# Patient Record
Sex: Female | Born: 1955 | Race: Black or African American | Hispanic: No | Marital: Married | Smoking: Current every day smoker
Health system: Southern US, Community
[De-identification: ages and names within clinical notes are randomized; demographics above are authoritative.]

## PROBLEM LIST (undated history)

## (undated) DIAGNOSIS — M797 Fibromyalgia: Secondary | ICD-10-CM

## (undated) DIAGNOSIS — R1115 Cyclical vomiting syndrome unrelated to migraine: Secondary | ICD-10-CM

## (undated) DIAGNOSIS — J45909 Unspecified asthma, uncomplicated: Secondary | ICD-10-CM

## (undated) DIAGNOSIS — M5432 Sciatica, left side: Secondary | ICD-10-CM

## (undated) DIAGNOSIS — K5909 Other constipation: Secondary | ICD-10-CM

## (undated) DIAGNOSIS — K7460 Unspecified cirrhosis of liver: Secondary | ICD-10-CM

## (undated) DIAGNOSIS — G8929 Other chronic pain: Secondary | ICD-10-CM

## (undated) DIAGNOSIS — F1290 Cannabis use, unspecified, uncomplicated: Secondary | ICD-10-CM

## (undated) DIAGNOSIS — Z9889 Other specified postprocedural states: Secondary | ICD-10-CM

## (undated) DIAGNOSIS — R112 Nausea with vomiting, unspecified: Secondary | ICD-10-CM

## (undated) DIAGNOSIS — I10 Essential (primary) hypertension: Secondary | ICD-10-CM

## (undated) DIAGNOSIS — A048 Other specified bacterial intestinal infections: Secondary | ICD-10-CM

## (undated) DIAGNOSIS — B1920 Unspecified viral hepatitis C without hepatic coma: Secondary | ICD-10-CM

## (undated) DIAGNOSIS — M549 Dorsalgia, unspecified: Secondary | ICD-10-CM

## (undated) DIAGNOSIS — D219 Benign neoplasm of connective and other soft tissue, unspecified: Secondary | ICD-10-CM

## (undated) DIAGNOSIS — K219 Gastro-esophageal reflux disease without esophagitis: Secondary | ICD-10-CM

## (undated) DIAGNOSIS — D649 Anemia, unspecified: Secondary | ICD-10-CM

## (undated) DIAGNOSIS — R109 Unspecified abdominal pain: Secondary | ICD-10-CM

## (undated) HISTORY — DX: Unspecified viral hepatitis C without hepatic coma: B19.20

## (undated) HISTORY — DX: Other specified bacterial intestinal infections: A04.8

## (undated) HISTORY — DX: Unspecified cirrhosis of liver: K74.60

## (undated) HISTORY — PX: KNEE SURGERY: SHX244

---

## 2000-12-11 ENCOUNTER — Emergency Department (HOSPITAL_COMMUNITY): Admission: EM | Admit: 2000-12-11 | Discharge: 2000-12-11 | Payer: Self-pay | Admitting: Emergency Medicine

## 2001-08-16 ENCOUNTER — Encounter: Payer: Self-pay | Admitting: Family Medicine

## 2001-08-16 ENCOUNTER — Ambulatory Visit (HOSPITAL_COMMUNITY): Admission: RE | Admit: 2001-08-16 | Discharge: 2001-08-16 | Payer: Self-pay | Admitting: Family Medicine

## 2003-01-15 ENCOUNTER — Ambulatory Visit (HOSPITAL_COMMUNITY): Admission: RE | Admit: 2003-01-15 | Discharge: 2003-01-15 | Payer: Self-pay | Admitting: Family Medicine

## 2003-08-31 ENCOUNTER — Emergency Department (HOSPITAL_COMMUNITY): Admission: EM | Admit: 2003-08-31 | Discharge: 2003-08-31 | Payer: Self-pay | Admitting: Emergency Medicine

## 2004-03-13 ENCOUNTER — Emergency Department (HOSPITAL_COMMUNITY): Admission: EM | Admit: 2004-03-13 | Discharge: 2004-03-14 | Payer: Self-pay | Admitting: Emergency Medicine

## 2004-08-12 ENCOUNTER — Ambulatory Visit (HOSPITAL_COMMUNITY): Admission: RE | Admit: 2004-08-12 | Discharge: 2004-08-12 | Payer: Self-pay | Admitting: Family Medicine

## 2005-01-10 ENCOUNTER — Emergency Department (HOSPITAL_COMMUNITY): Admission: EM | Admit: 2005-01-10 | Discharge: 2005-01-10 | Payer: Self-pay | Admitting: Emergency Medicine

## 2005-07-14 ENCOUNTER — Emergency Department (HOSPITAL_COMMUNITY): Admission: EM | Admit: 2005-07-14 | Discharge: 2005-07-14 | Payer: Self-pay | Admitting: Emergency Medicine

## 2007-08-18 ENCOUNTER — Ambulatory Visit (HOSPITAL_COMMUNITY): Admission: RE | Admit: 2007-08-18 | Discharge: 2007-08-18 | Payer: Self-pay | Admitting: Family Medicine

## 2007-09-07 ENCOUNTER — Encounter (INDEPENDENT_AMBULATORY_CARE_PROVIDER_SITE_OTHER): Payer: Self-pay | Admitting: *Deleted

## 2008-01-11 ENCOUNTER — Ambulatory Visit: Payer: Self-pay | Admitting: Internal Medicine

## 2008-01-18 ENCOUNTER — Ambulatory Visit (HOSPITAL_COMMUNITY): Admission: RE | Admit: 2008-01-18 | Discharge: 2008-01-18 | Payer: Self-pay | Admitting: Internal Medicine

## 2008-01-18 ENCOUNTER — Encounter: Payer: Self-pay | Admitting: Internal Medicine

## 2008-01-18 ENCOUNTER — Ambulatory Visit: Payer: Self-pay | Admitting: Internal Medicine

## 2008-01-18 HISTORY — PX: COLONOSCOPY: SHX174

## 2008-01-18 HISTORY — PX: ESOPHAGOGASTRODUODENOSCOPY: SHX1529

## 2008-01-23 ENCOUNTER — Ambulatory Visit (HOSPITAL_COMMUNITY): Admission: RE | Admit: 2008-01-23 | Discharge: 2008-01-23 | Payer: Self-pay | Admitting: Internal Medicine

## 2008-03-29 ENCOUNTER — Ambulatory Visit: Payer: Self-pay | Admitting: Internal Medicine

## 2008-04-05 ENCOUNTER — Encounter (HOSPITAL_COMMUNITY): Admission: RE | Admit: 2008-04-05 | Discharge: 2008-04-29 | Payer: Self-pay | Admitting: Internal Medicine

## 2008-04-30 ENCOUNTER — Ambulatory Visit: Payer: Self-pay | Admitting: Gastroenterology

## 2008-05-01 ENCOUNTER — Ambulatory Visit (HOSPITAL_COMMUNITY): Admission: RE | Admit: 2008-05-01 | Discharge: 2008-05-01 | Payer: Self-pay | Admitting: Internal Medicine

## 2008-09-16 ENCOUNTER — Ambulatory Visit (HOSPITAL_COMMUNITY): Admission: RE | Admit: 2008-09-16 | Discharge: 2008-09-16 | Payer: Self-pay | Admitting: Family Medicine

## 2008-10-30 ENCOUNTER — Encounter: Payer: Self-pay | Admitting: Internal Medicine

## 2008-12-27 ENCOUNTER — Encounter: Payer: Self-pay | Admitting: Internal Medicine

## 2009-06-24 ENCOUNTER — Encounter: Payer: Self-pay | Admitting: Orthopedic Surgery

## 2009-08-04 ENCOUNTER — Emergency Department (HOSPITAL_COMMUNITY): Admission: EM | Admit: 2009-08-04 | Discharge: 2009-08-04 | Payer: Self-pay | Admitting: Emergency Medicine

## 2009-10-23 ENCOUNTER — Ambulatory Visit (HOSPITAL_COMMUNITY): Admission: RE | Admit: 2009-10-23 | Discharge: 2009-10-23 | Payer: Self-pay | Admitting: Family Medicine

## 2010-03-13 ENCOUNTER — Emergency Department (HOSPITAL_COMMUNITY): Admission: EM | Admit: 2010-03-13 | Discharge: 2010-03-13 | Payer: Self-pay | Admitting: Emergency Medicine

## 2010-08-11 ENCOUNTER — Encounter: Payer: Self-pay | Admitting: Orthopedic Surgery

## 2010-08-11 ENCOUNTER — Emergency Department (HOSPITAL_COMMUNITY)
Admission: EM | Admit: 2010-08-11 | Discharge: 2010-08-12 | Payer: Self-pay | Source: Home / Self Care | Admitting: Emergency Medicine

## 2010-08-12 ENCOUNTER — Ambulatory Visit: Admit: 2010-08-12 | Payer: Self-pay | Admitting: Orthopedic Surgery

## 2010-08-12 ENCOUNTER — Ambulatory Visit
Admission: RE | Admit: 2010-08-12 | Discharge: 2010-08-12 | Payer: Self-pay | Source: Home / Self Care | Attending: Orthopedic Surgery | Admitting: Orthopedic Surgery

## 2010-08-12 ENCOUNTER — Encounter: Payer: Self-pay | Admitting: Orthopedic Surgery

## 2010-08-12 DIAGNOSIS — S82109A Unspecified fracture of upper end of unspecified tibia, initial encounter for closed fracture: Secondary | ICD-10-CM | POA: Insufficient documentation

## 2010-08-16 ENCOUNTER — Emergency Department (HOSPITAL_COMMUNITY)
Admission: EM | Admit: 2010-08-16 | Discharge: 2010-08-17 | Payer: Self-pay | Source: Home / Self Care | Admitting: Emergency Medicine

## 2010-08-18 ENCOUNTER — Ambulatory Visit: Admit: 2010-08-18 | Payer: Self-pay | Admitting: Orthopedic Surgery

## 2010-08-18 ENCOUNTER — Telehealth: Payer: Self-pay | Admitting: Orthopedic Surgery

## 2010-08-20 ENCOUNTER — Ambulatory Visit
Admission: RE | Admit: 2010-08-20 | Discharge: 2010-08-20 | Payer: Self-pay | Source: Home / Self Care | Attending: Orthopedic Surgery | Admitting: Orthopedic Surgery

## 2010-08-24 ENCOUNTER — Encounter: Payer: Self-pay | Admitting: Internal Medicine

## 2010-09-03 NOTE — Assessment & Plan Note (Signed)
Summary: 1 WK FOL-UP FX LT TIBIA/SELF-PAY/WKJ   Visit Type:  Follow-up Referring Provider:  ap er Primary Provider:  na  CC:  left knee fracture.  History of Present Illness: I saw Colleen Lee in the office today for a 1 week followup visit.  She is a 55 years old woman with the complaint of: LEFT knee pain secondary to fracture  Diagnosis:Problem # 1:  CLOSED FRACTURE OF UPPER END OF TIBIA (ICD-823.00)  DOI 08/11/10 stepped into a hole and fell.  Xrays [facility] left knee 08/11/10.  Meds: Tylox 5-500 Mg Caps (Oxycodone-acetaminophen) .... One tablet every 4 hours p.r.n. pain  Ibuprofen 800 Mg Tabs (Ibuprofen) .... One every 8 hours p.r.n. pain)  Promethazine Hcl 25 Mg Tabs (Promethazine hcl) .... One q.4 p.r.n. nausea  Xrays today  Complaints:nausea    Allergies: 1)  ! Penicillin  Physical Exam  Additional Exam:   LEFT knee.  Skin ecchymosis from cruising. It runs from her mid thigh to her lower tibial area.  Knee effusion has decreased significantly. Compartments are soft. Distal neurologic function is normal. Pulses are good. Color, is good. Temperature is good   Impression & Recommendations:  Problem # 1:  CLOSED FRACTURE OF UPPER END OF TIBIA (ICD-823.00) Assessment Comment Only  Separate and Identifiable X-Ray report   3 views, LEFT knee, including 10 call till view for fractured tibial plateau.  Medial tibial plateau fracture, nondisplaced. No avulsions, otherwise noted.  Impression medial tibial plateau fracture. No change from previous position.       plan range of motion brace as stated below. Advance 20 to 30 next visit if x-rays are okay  10-60 ROM BRACE   Orders: Post-Op Check ([address]) Knee x-ray,  3 views ([address]454)  Medications Added to Medication List This Visit: 1)  Norco 10-325 Mg Tabs (Hydrocodone-acetaminophen) .Marland Kitchen.. 1 q 4 as needed pain  Patient Instructions: 1)  remove for bathing and slleping  2)  return in 2 weeks xrays and advance rom    3)  continue crutches  Prescriptions: NORCO 10-325 MG TABS (HYDROCODONE-ACETAMINOPHEN) 1 q 4 as needed pain  #42 x 5   Entered and Authorized by:   Fuller Canada MD   Signed by:   Fuller Canada MD on 08/20/2010   Method used:   Print then Give to Patient   RxID:   [id number]    Orders Added: 1)  Post-Op Check [30865] 2)  Knee x-ray,  3 views [78469]

## 2010-09-03 NOTE — Letter (Signed)
Summary: LETTER FROM [facility]  LETTER FROM [facility]   Imported By: Diana Eves 12/27/2008 16:35:55  _____________________________________________________________________  External Attachment:    Type:   Image     Comment:   External Document

## 2010-09-03 NOTE — Letter (Signed)
Summary: [name] Referral No Show  [name]  [address]  [place], [place] [address]   Phone: [phone]  Fax: [phone]    Miguel [facility]. [facility] Salome ArntElonda Husky, FNP [address] [place], [place]  [address]   June 24, 2009   Re:    Colleen Lee [facility] DOB:    30-Mar-1956    The above named patient was a no show for the scheduled appointment:  Date:   Monday,  06/23/2009 Time:  2:00 PM  Thank you for your kind referral.  Please feel free to contact our office if we can be of further service.   Sincerely,   [facility] Office of Dr. Terrance Mass, MD

## 2010-09-03 NOTE — Medication Information (Signed)
Summary: Tax adviser   Imported By: Cammie Sickle 08/12/2010 17:52:39  _____________________________________________________________________  External Attachment:    Type:   Image     Comment:   External Document

## 2010-09-03 NOTE — Progress Notes (Signed)
Summary: call from patient, keeping regular appt  Phone Note Call from Patient   Caller: Patient Summary of Call: Patient had been given an appt for today, 08/18/10, fol'g re-visit to [facility] Sunday night, for foot and lower leg swelling,bruising; patient did call back and cancel today's appt as said she is much better, and will keep her regular appt for Thurs 08/20/10. Initial call taken by: Cammie Sickle,  August 18, 2010 3:08 PM

## 2010-09-03 NOTE — Letter (Signed)
Summary: history form   history form   Imported By: Eugenio Hoes 08/12/2010 16:54:32  _____________________________________________________________________  External Attachment:    Type:   Image     Comment:   External Document

## 2010-09-03 NOTE — Assessment & Plan Note (Signed)
Summary: [facility] FOL/UP/FX LT TIBIA PLATEAU/XRAY [facility] 08/11/10/SELF-PAY/CAF   Vital Signs:  Patient profile:   55 year old female Height:      66 inches Weight:      180 pounds Pulse rate:   88 / minute Resp:     18 per minute  Vitals Entered By: Fuller Canada MD (August 12, 2010 9:24 AM)  Visit Type:  new patient Referring Provider:  ap er Primary Provider:  na  CC:  left knee pain.  History of Present Illness: I saw Colleen Lee in the office today for an initial visit.  She is a 55 years old woman with the complaint of:  Left knee pain.  DOI 08/11/10 stepped into a hole and fell.  Xrays [facility] left knee 08/11/10.  Meds: Percocet 5mg  number 20 given and Motrin 800mg  number 15 given, pt said she did not get an rx for these.    55 year old female, who, medial, slightly depressed tibial plateau fracture with extension into the lateral intercondylar eminence with hemarthrosis. Complains of severe throbbing pain and some numbness in her LEFT foot. No not in the leg, medial or lateral. She really didn't give much of a history of an. She is in severe pain.  No signs of compartment syndrome in the leg.  Allergies (verified): 1)  ! Penicillin  Past History:  Past Medical History: na  Past Surgical History: na  Family History: FH of Cancer:  Hx, family, asthma  Social History: Patient is married.  unemployed smokes 2ppd no alcohol no caffeine  She is very upset her knee cyst and she really needs to get to a funeral in [place], [place]  Review of Systems Constitutional:  Denies weight loss, weight gain, fever, chills, and fatigue. Cardiovascular:  Denies chest pain, palpitations, fainting, and murmurs. Respiratory:  Denies short of breath, wheezing, couch, tightness, pain on inspiration, and snoring . Gastrointestinal:  Denies heartburn, nausea, vomiting, diarrhea, constipation, and blood in your stools. Genitourinary:  Denies frequency, urgency, difficulty urinating,  painful urination, flank pain, and bleeding in urine. Neurologic:  Denies numbness, tingling, unsteady gait, dizziness, tremors, and seizure. Musculoskeletal:  Denies joint pain, swelling, instability, stiffness, redness, heat, and muscle pain. Endocrine:  Denies excessive thirst, exessive urination, and heat or cold intolerance. Psychiatric:  Denies nervousness, depression, anxiety, and hallucinations. Skin:  Denies changes in the skin, poor healing, rash, itching, and redness. HEENT:  Denies blurred or double vision, eye pain, redness, and watering. Immunology:  Denies seasonal allergies, sinus problems, and allergic to bee stings. Hemoatologic:  Denies easy bleeding and brusing.  Physical Exam  Additional Exam:  GEN: well developed, well nourished, normal grooming and hygiene, no deformity and normal body habitus.   CDV: pulses are normal, no edema, no erythema. no tenderness  Lymph: normal lymph nodes   Skin: no rashes, skin lesions or open sores   NEURO: normal coordination, reflexes, sensation.   Psyche: awake, alert and oriented. Mood normal   Gait: crutch ambulation with a knee immobilizer toe touch weightbearing.  She has a tense effusion of the LEFT knee was 0 range of motion. Motor exam could not really be tested. I was able to test her ligaments and extension and medial and lateral, and there was no anterior or posterior drawer.  Extremities seem to be aligned properly without contracture subluxation, atrophy or     Impression & Recommendations:  Problem # 1:  CLOSED FRACTURE OF UPPER END OF TIBIA (ICD-823.00) Assessment New  The x-rays were  done at [facility]. The report and the films have been reviewed.  Orders: New Patient Level III (45409) Tibial Plateau Fx [phone])  Medications Added to Medication List This Visit: 1)  Tylox 5-500 Mg Caps (Oxycodone-acetaminophen) .... One tablet every 4 hours p.r.n. pain 2)  Ibuprofen 800 Mg Tabs (Ibuprofen) .... One every 8  hours p.r.n. pain 3)  Promethazine Hcl 25 Mg Tabs (Promethazine hcl) .... One q.4 p.r.n. nausea  Patient Instructions: 1)  Please schedule a follow-up appointment in 1 week. 2)  xrays knee AP AND LATERAL WITH 10 CAUDAL TILT  3)  WEAR BRACE AND NO WEIGHT ON THE LEFT FOOT  Prescriptions: PROMETHAZINE HCL 25 MG TABS (PROMETHAZINE HCL) one q.4 p.r.n. nausea  #30 x 0   Entered and Authorized by:   Fuller Canada MD   Signed by:   Fuller Canada MD on 08/12/2010   Method used:   Handwritten   RxID:   [id number] IBUPROFEN 800 MG TABS (IBUPROFEN) one every 8 hours p.r.n. pain  #90 x 5   Entered and Authorized by:   Fuller Canada MD   Signed by:   Fuller Canada MD on 08/12/2010   Method used:   Handwritten   RxID:   [id number] TYLOX 5-500 MG CAPS (OXYCODONE-ACETAMINOPHEN) one tablet every 4 hours p.r.n. pain  #90 x 0   Entered and Authorized by:   Fuller Canada MD   Signed by:   Fuller Canada MD on 08/12/2010   Method used:   Handwritten   RxID:   [id number]    Orders Added: 1)  New Patient Level III [25956] 2)  Tibial Plateau Fx [27530]

## 2010-09-03 NOTE — Letter (Signed)
Summary: *Orthopedic No Show Letter  [name]  [address]  [place], [place] [address]   Phone: [phone]  Fax: [phone]    06/24/2009   Colleen Lee [address] [place], [place]  [address]   Dear Ms. Geiler,   Our records indicate that you missed your scheduled appointment with Dr. Beaulah Corin on 06/23/2009.  Please contact this office to reschedule your appointment as soon as possible.  It is important that you keep your scheduled appointments with your physician, so we can provide you the best care possible.  We have enclosed an appointment card for your convenience.      Sincerely,    Dr. Terrance Mass, MD [facility] Phone [phone]

## 2010-09-08 ENCOUNTER — Ambulatory Visit (INDEPENDENT_AMBULATORY_CARE_PROVIDER_SITE_OTHER): Payer: Self-pay | Admitting: Orthopedic Surgery

## 2010-09-08 ENCOUNTER — Encounter: Payer: Self-pay | Admitting: Orthopedic Surgery

## 2010-09-08 DIAGNOSIS — S82109A Unspecified fracture of upper end of unspecified tibia, initial encounter for closed fracture: Secondary | ICD-10-CM

## 2010-09-17 NOTE — Letter (Signed)
Summary: Generic Letter out of work status  [name]  [address]  [place], [place] [address]   Phone: [phone]  Fax: [phone]    09/08/2010  Colleen Lee [address] [place], [place]  [address]  To whom it may concern:   This patient is out of work 12 weeks starting 08/11/10. She ahs a fractured tibial plateau. She may need to be partial weight bearing for 6 months            Sincerely,   Colleen Canada MD

## 2010-09-17 NOTE — Medication Information (Signed)
Summary: Tax adviser   Imported By: Cammie Sickle 09/08/2010 12:06:09  _____________________________________________________________________  External Attachment:    Type:   Image     Comment:   External Document

## 2010-09-17 NOTE — Assessment & Plan Note (Signed)
Summary: 2 wk reck/xr lt tibia/m cone disc in prog/caf   Visit Type:  Follow-up Referring Provider:  ap er Primary Provider:  na  CC:  fx care.  History of Present Illness: Fracture followup  Diagnosis:Problem # 1:  CLOSED FRACTURE OF UPPER END OF TIBIA (ICD-823.00)  DOI 08/11/10 stepped into a hole and fell.  Xrays [facility] left knee 08/11/10.  Meds: Tylox 5-500 Mg Caps (Oxycodone-acetaminophen) .... One tablet every 4 hours p.r.n. pain  Ibuprofen 800 Mg Tabs (Ibuprofen) .... One every 8 hours p.r.n. pain)  Promethazine Hcl 25 Mg Tabs (Promethazine hcl) .... One q.4 p.r.n. nausea  current pain level VII.     Allergies: 1)  ! Penicillin   Impression & Recommendations:  Problem # 1:  CLOSED FRACTURE OF UPPER END OF TIBIA (ICD-823.00) Assessment Improved  Separate and Identifiable X-Ray report      AP lateral and 10 caudal till view LEFT knee for tibial plateau fracture  The fracture is seen to be in stable configuration.  Fracture lines are more visible as expected as the fracture continues to progress towards healing.  Impression stable tibial plateau fracture/proximal tibia fracture  Treatment plan range of motion brace continue at 0-4 range of motion.  Continue crutch partial weightbearing  Followup one month repeat x-ray  Patient advised to be out of work and a letter has been written on her behalf  Orders: Post-Op Check ([address]) Knee x-ray,  3 views ([address])  Patient Instructions: 1)  Please schedule a follow-up appointment in 1 month. 2)  xray left knee w/ 10 degree caudal tilt view Prescriptions: PROMETHAZINE HCL 25 MG TABS (PROMETHAZINE HCL) one q.4 p.r.n. nausea  #60 x 0   Entered and Authorized by:   Fuller Canada MD   Signed by:   Fuller Canada MD on 09/08/2010   Method used:   Faxed to ...       [facility]  [address] (retail)       [address]       [place], [place]  [address]       Ph: [phone]       Fax: [phone]  RxID:   [id number]    Orders Added: 1)  Post-Op Check [99024] 2)  Knee x-ray,  3 views [73562]

## 2010-10-02 ENCOUNTER — Other Ambulatory Visit (HOSPITAL_COMMUNITY): Payer: Self-pay | Admitting: *Deleted

## 2010-10-02 DIAGNOSIS — Z139 Encounter for screening, unspecified: Secondary | ICD-10-CM

## 2010-10-06 ENCOUNTER — Ambulatory Visit (INDEPENDENT_AMBULATORY_CARE_PROVIDER_SITE_OTHER): Payer: PRIVATE HEALTH INSURANCE | Admitting: Orthopedic Surgery

## 2010-10-06 ENCOUNTER — Encounter: Payer: Self-pay | Admitting: Orthopedic Surgery

## 2010-10-06 DIAGNOSIS — S82109A Unspecified fracture of upper end of unspecified tibia, initial encounter for closed fracture: Secondary | ICD-10-CM

## 2010-10-13 NOTE — Assessment & Plan Note (Signed)
Summary: 1 m RE-CK/XRAY LT KNEE/M.CONE DISC IN PROG/CAF   Visit Type:  Follow-up Referring Provider:  ap er Primary Provider:  na  CC:  left knee.  History of Present Illness: I saw Colleen Lee in the office today for a 1 month followup visit.  She is a 55 years old woman with the complaint of:  left knee fracture  Fracture followup  Diagnosis:Problem # 1:  CLOSED FRACTURE OF UPPER END OF TIBIA (ICD-823.00)  TREATMENT:range of motion brace.  Continue crutches partial weightbearing  DOI 08/11/10 stepped into a hole and fell.  Xrays [facility] left knee 08/11/10.  Meds: her meds are hydrocodone, 10 mg Today, recheck with xrays  Complaints:medial knee pain    Allergies: 1)  ! Penicillin   Impression & Recommendations:  Problem # 1:  CLOSED FRACTURE OF UPPER END OF TIBIA (ICD-823.00) Assessment Improved  Separate and Identifiable X-Ray report      AP, lateral, and 10 caudal tilt view of the LEFT knee shows a proximal tibia fracture. Mild depression, nondisplaced.  Alignment is acceptable.  Impression healing proximal, medial tibial plateau fracture.  Continue brace full range of motion.  Come back in a month for x-ray again.  Orders: Post-Op Check ([address]) Knee x-ray,  3 views ([address])  Patient Instructions: 1)  Please schedule a follow-up appointment in 1 month. 2)  continue knee brace  3)  xrays in 1 month    Orders Added: 1)  Post-Op Check [99024] 2)  Knee x-ray,  3 views [73562]

## 2010-10-18 LAB — URINALYSIS, ROUTINE W REFLEX MICROSCOPIC
Bilirubin Urine: NEGATIVE
Glucose, UA: NEGATIVE mg/dL
Hgb urine dipstick: NEGATIVE
Ketones, ur: NEGATIVE mg/dL
Nitrite: NEGATIVE
Protein, ur: NEGATIVE mg/dL
Specific Gravity, Urine: 1.01 (ref 1.005–1.030)
Urobilinogen, UA: 0.2 mg/dL (ref 0.0–1.0)
pH: 7 (ref 5.0–8.0)

## 2010-10-18 LAB — CBC
HCT: 43.7 % (ref 36.0–46.0)
Hemoglobin: 14.9 g/dL (ref 12.0–15.0)
MCHC: 34 g/dL (ref 30.0–36.0)
MCV: 93.3 fL (ref 78.0–100.0)
Platelets: 198 10*3/uL (ref 150–400)
RBC: 4.68 MIL/uL (ref 3.87–5.11)
RDW: 13.5 % (ref 11.5–15.5)
WBC: 6.5 10*3/uL (ref 4.0–10.5)

## 2010-10-18 LAB — DIFFERENTIAL
Basophils Absolute: 0.1 10*3/uL (ref 0.0–0.1)
Basophils Relative: 1 % (ref 0–1)
Eosinophils Absolute: 0.1 10*3/uL (ref 0.0–0.7)
Eosinophils Relative: 2 % (ref 0–5)
Lymphocytes Relative: 23 % (ref 12–46)
Lymphs Abs: 1.5 10*3/uL (ref 0.7–4.0)
Monocytes Absolute: 0.6 10*3/uL (ref 0.1–1.0)
Monocytes Relative: 9 % (ref 3–12)
Neutro Abs: 4.2 10*3/uL (ref 1.7–7.7)
Neutrophils Relative %: 65 % (ref 43–77)

## 2010-10-18 LAB — COMPREHENSIVE METABOLIC PANEL
ALT: 55 U/L — ABNORMAL HIGH (ref 0–35)
AST: 47 U/L — ABNORMAL HIGH (ref 0–37)
Albumin: 4.3 g/dL (ref 3.5–5.2)
Alkaline Phosphatase: 64 U/L (ref 39–117)
BUN: 7 mg/dL (ref 6–23)
CO2: 27 mEq/L (ref 19–32)
Calcium: 10.1 mg/dL (ref 8.4–10.5)
Chloride: 102 mEq/L (ref 96–112)
Creatinine, Ser: 0.67 mg/dL (ref 0.4–1.2)
GFR calc Af Amer: >60 mL/min (ref >60)
GFR calc non Af Amer: >60 mL/min (ref >60)
Glucose, Bld: 92 mg/dL (ref 70–99)
Potassium: 3.9 mEq/L (ref 3.5–5.1)
Sodium: 134 mEq/L — ABNORMAL LOW (ref 135–145)
Total Bilirubin: 0.8 mg/dL (ref 0.3–1.2)
Total Protein: 8.8 g/dL — ABNORMAL HIGH (ref 6.0–8.3)

## 2010-10-18 LAB — URINE MICROSCOPIC-ADD ON

## 2010-10-18 LAB — PREGNANCY, URINE: Preg Test, Ur: NEGATIVE

## 2010-10-18 LAB — LIPASE, BLOOD: Lipase: 28 U/L (ref 11–59)

## 2010-10-27 ENCOUNTER — Ambulatory Visit (HOSPITAL_COMMUNITY)
Admission: RE | Admit: 2010-10-27 | Discharge: 2010-10-27 | Disposition: A | Discharge status: Home / Self Care | Payer: Self-pay | Source: Ambulatory Visit | Attending: *Deleted | Admitting: *Deleted

## 2010-10-27 DIAGNOSIS — Z139 Encounter for screening, unspecified: Secondary | ICD-10-CM

## 2010-11-05 ENCOUNTER — Other Ambulatory Visit: Payer: Self-pay | Admitting: Orthopedic Surgery

## 2010-11-05 ENCOUNTER — Telehealth: Payer: Self-pay | Admitting: Orthopedic Surgery

## 2010-11-05 DIAGNOSIS — M25569 Pain in unspecified knee: Secondary | ICD-10-CM

## 2010-11-05 MED ORDER — HYDROCODONE-ACETAMINOPHEN 10-325 MG PO TABS: 1.0000 | ORAL_TABLET | ORAL | Status: AC | PRN

## 2010-11-05 NOTE — Telephone Encounter (Signed)
I have already filled this medication today

## 2010-11-05 NOTE — Telephone Encounter (Signed)
Called in Norco 10 for pain

## 2010-11-05 NOTE — Telephone Encounter (Signed)
Patient states almost out of pain medication, states Dr Romeo Apple writes the Rx out.  [facility] [place]. Advised Dr Romeo Apple is out of office through Monday 11/09/10.  Patient ph# [phone].  Please advise.

## 2010-11-10 ENCOUNTER — Encounter: Payer: Self-pay | Admitting: Orthopedic Surgery

## 2010-11-10 ENCOUNTER — Ambulatory Visit (INDEPENDENT_AMBULATORY_CARE_PROVIDER_SITE_OTHER): Payer: Self-pay | Admitting: Orthopedic Surgery

## 2010-11-10 DIAGNOSIS — S82143A Displaced bicondylar fracture of unspecified tibia, initial encounter for closed fracture: Secondary | ICD-10-CM

## 2010-11-10 DIAGNOSIS — S82109A Unspecified fracture of upper end of unspecified tibia, initial encounter for closed fracture: Secondary | ICD-10-CM

## 2010-11-10 NOTE — Patient Instructions (Addendum)
Continue brace and crutch  Start Phys Therapy   MRI left knee   Out of work for 12 months

## 2010-11-10 NOTE — Progress Notes (Signed)
She is a 55 years old woman with the complaint of: left knee fracture  Fracture followup  Diagnosis:Problem # 1: CLOSED FRACTURE OF UPPER END OF TIBIA (ICD-823.00)  TREATMENT:range of motion brace. Continue crutches partial weightbearing  DOI 08/11/10 stepped into a hole and fell.  Xrays [facility] left knee 08/11/10.   Complains of pain and swelling and popping in the LEFT knee.  Exam shows no ligament damage, and zero at 30 with no ligament laxity.  Flexion arc is  Tenderness along the medial joint line.  Recommend starting physical therapy for strengthening, continue brace.  MRI to evaluate for meniscal tear.

## 2010-11-10 NOTE — Discharge Summary (Signed)
AP lateral, and 10 collateral tilt of the LEFT knee compared to films, dated March 6.  There is a medial tibial plateau fracture, which has healed with minimal depression.  Mild varus alignment.

## 2010-11-13 ENCOUNTER — Telehealth: Payer: Self-pay | Admitting: Radiology

## 2010-11-13 NOTE — Telephone Encounter (Signed)
Patient has MRI appointment at [facility] on 11-18-10 at 8:30. Patient has the [employer]. She has an appointment to follow up in the office.

## 2010-11-18 ENCOUNTER — Ambulatory Visit (HOSPITAL_COMMUNITY)
Admission: RE | Admit: 2010-11-18 | Discharge: 2010-11-18 | Disposition: A | Discharge status: Home / Self Care | Payer: Self-pay | Source: Ambulatory Visit | Attending: Orthopedic Surgery | Admitting: Orthopedic Surgery

## 2010-11-18 DIAGNOSIS — M25669 Stiffness of unspecified knee, not elsewhere classified: Secondary | ICD-10-CM | POA: Insufficient documentation

## 2010-11-18 DIAGNOSIS — S82143A Displaced bicondylar fracture of unspecified tibia, initial encounter for closed fracture: Secondary | ICD-10-CM

## 2010-11-18 DIAGNOSIS — M6281 Muscle weakness (generalized): Secondary | ICD-10-CM | POA: Insufficient documentation

## 2010-11-18 DIAGNOSIS — R262 Difficulty in walking, not elsewhere classified: Secondary | ICD-10-CM | POA: Insufficient documentation

## 2010-11-18 DIAGNOSIS — M25569 Pain in unspecified knee: Secondary | ICD-10-CM | POA: Insufficient documentation

## 2010-11-18 DIAGNOSIS — IMO0001 Reserved for inherently not codable concepts without codable children: Secondary | ICD-10-CM | POA: Insufficient documentation

## 2010-11-18 DIAGNOSIS — M23329 Other meniscus derangements, posterior horn of medial meniscus, unspecified knee: Secondary | ICD-10-CM | POA: Insufficient documentation

## 2010-11-24 ENCOUNTER — Ambulatory Visit (HOSPITAL_COMMUNITY)
Admission: RE | Admit: 2010-11-24 | Discharge: 2010-11-24 | Disposition: A | Discharge status: Home / Self Care | Payer: Self-pay | Source: Ambulatory Visit | Attending: Family Medicine | Admitting: Family Medicine

## 2010-11-25 ENCOUNTER — Ambulatory Visit (INDEPENDENT_AMBULATORY_CARE_PROVIDER_SITE_OTHER): Payer: Self-pay | Admitting: Orthopedic Surgery

## 2010-11-25 DIAGNOSIS — M23329 Other meniscus derangements, posterior horn of medial meniscus, unspecified knee: Secondary | ICD-10-CM

## 2010-11-25 MED ORDER — HYDROCODONE-ACETAMINOPHEN 10-325 MG PO TABS: 1.0000 | ORAL_TABLET | ORAL | Status: DC | PRN

## 2010-11-25 NOTE — Progress Notes (Signed)
55 years old proximal tibial plateau fracture  Date of injury January 10  Treatment bracing  Complained of popping  Sent for MRI.  MRI shows torn medial meniscus  After reviewing the study report and reviewing them with the patient the patient was offered surgical treatment the patient would like to wait after she completes her therapy course to see if she still needs to surgical treatment  She is continuing therapy bracing crutches and wean as tolerated from the crutches and followup with me in about a month

## 2010-11-27 ENCOUNTER — Ambulatory Visit (HOSPITAL_COMMUNITY)
Admission: RE | Admit: 2010-11-27 | Discharge: 2010-11-27 | Disposition: A | Discharge status: Home / Self Care | Payer: Self-pay | Source: Ambulatory Visit | Attending: Family Medicine | Admitting: Family Medicine

## 2010-12-01 ENCOUNTER — Ambulatory Visit (HOSPITAL_COMMUNITY)
Admission: RE | Admit: 2010-12-01 | Discharge: 2010-12-01 | Disposition: A | Discharge status: Home / Self Care | Payer: Self-pay | Source: Ambulatory Visit | Attending: Family Medicine | Admitting: Family Medicine

## 2010-12-01 DIAGNOSIS — M6281 Muscle weakness (generalized): Secondary | ICD-10-CM | POA: Insufficient documentation

## 2010-12-01 DIAGNOSIS — M25569 Pain in unspecified knee: Secondary | ICD-10-CM | POA: Insufficient documentation

## 2010-12-01 DIAGNOSIS — R262 Difficulty in walking, not elsewhere classified: Secondary | ICD-10-CM | POA: Insufficient documentation

## 2010-12-01 DIAGNOSIS — IMO0001 Reserved for inherently not codable concepts without codable children: Secondary | ICD-10-CM | POA: Insufficient documentation

## 2010-12-01 DIAGNOSIS — M25669 Stiffness of unspecified knee, not elsewhere classified: Secondary | ICD-10-CM | POA: Insufficient documentation

## 2010-12-04 ENCOUNTER — Ambulatory Visit (HOSPITAL_COMMUNITY)
Admission: RE | Admit: 2010-12-04 | Discharge: 2010-12-04 | Disposition: A | Discharge status: Home / Self Care | Payer: Self-pay | Source: Ambulatory Visit | Attending: Family Medicine | Admitting: Family Medicine

## 2010-12-12 ENCOUNTER — Emergency Department (HOSPITAL_COMMUNITY)
Admission: EM | Admit: 2010-12-12 | Discharge: 2010-12-12 | Disposition: A | Discharge status: Home / Self Care | Payer: Self-pay | Attending: Emergency Medicine | Admitting: Emergency Medicine

## 2010-12-12 ENCOUNTER — Emergency Department (HOSPITAL_COMMUNITY): Payer: Self-pay

## 2010-12-12 DIAGNOSIS — R42 Dizziness and giddiness: Secondary | ICD-10-CM | POA: Insufficient documentation

## 2010-12-12 DIAGNOSIS — W010XXA Fall on same level from slipping, tripping and stumbling without subsequent striking against object, initial encounter: Secondary | ICD-10-CM | POA: Insufficient documentation

## 2010-12-12 DIAGNOSIS — R55 Syncope and collapse: Secondary | ICD-10-CM | POA: Insufficient documentation

## 2010-12-12 DIAGNOSIS — S92919A Unspecified fracture of unspecified toe(s), initial encounter for closed fracture: Secondary | ICD-10-CM | POA: Insufficient documentation

## 2010-12-12 LAB — URINALYSIS, ROUTINE W REFLEX MICROSCOPIC
Bilirubin Urine: NEGATIVE
Glucose, UA: NEGATIVE mg/dL
Hgb urine dipstick: NEGATIVE
Ketones, ur: NEGATIVE mg/dL
Nitrite: NEGATIVE
Protein, ur: NEGATIVE mg/dL
Specific Gravity, Urine: 1.01 (ref 1.005–1.030)
Urobilinogen, UA: 0.2 mg/dL (ref 0.0–1.0)
pH: 6 (ref 5.0–8.0)

## 2010-12-12 LAB — CBC
HCT: 43.8 % (ref 36.0–46.0)
Hemoglobin: 15.2 g/dL — ABNORMAL HIGH (ref 12.0–15.0)
MCH: 31 pg (ref 26.0–34.0)
MCHC: 34.7 g/dL (ref 30.0–36.0)
MCV: 89.2 fL (ref 78.0–100.0)
Platelets: 185 10*3/uL (ref 150–400)
RBC: 4.91 MIL/uL (ref 3.87–5.11)
RDW: 13.8 % (ref 11.5–15.5)
WBC: 10.6 10*3/uL — ABNORMAL HIGH (ref 4.0–10.5)

## 2010-12-12 LAB — DIFFERENTIAL
Basophils Absolute: 0 10*3/uL (ref 0.0–0.1)
Basophils Relative: 0 % (ref 0–1)
Eosinophils Absolute: 0 10*3/uL (ref 0.0–0.7)
Eosinophils Relative: 0 % (ref 0–5)
Lymphocytes Relative: 20 % (ref 12–46)
Lymphs Abs: 2.2 10*3/uL (ref 0.7–4.0)
Monocytes Absolute: 0.8 10*3/uL (ref 0.1–1.0)
Monocytes Relative: 7 % (ref 3–12)
Neutro Abs: 7.6 10*3/uL (ref 1.7–7.7)
Neutrophils Relative %: 72 % (ref 43–77)

## 2010-12-12 LAB — COMPREHENSIVE METABOLIC PANEL
ALT: 34 U/L (ref 0–35)
AST: 28 U/L (ref 0–37)
Albumin: 4.5 g/dL (ref 3.5–5.2)
Alkaline Phosphatase: 96 U/L (ref 39–117)
BUN: 9 mg/dL (ref 6–23)
CO2: 27 mEq/L (ref 19–32)
Calcium: 11.6 mg/dL — ABNORMAL HIGH (ref 8.4–10.5)
Chloride: 101 mEq/L (ref 96–112)
Creatinine, Ser: 0.77 mg/dL (ref 0.4–1.2)
GFR calc Af Amer: >60 mL/min (ref >60)
GFR calc non Af Amer: >60 mL/min (ref >60)
Glucose, Bld: 109 mg/dL — ABNORMAL HIGH (ref 70–99)
Potassium: 3.3 mEq/L — ABNORMAL LOW (ref 3.5–5.1)
Sodium: 136 mEq/L (ref 135–145)
Total Bilirubin: 0.8 mg/dL (ref 0.3–1.2)
Total Protein: 9.3 g/dL — ABNORMAL HIGH (ref 6.0–8.3)

## 2010-12-12 LAB — POCT CARDIAC MARKERS
CKMB, poc: <1 ng/mL — ABNORMAL LOW (ref 1.0–8.0)
Myoglobin, poc: 32.2 ng/mL (ref 12–200)
Troponin i, poc: <0.05 ng/mL (ref 0.00–0.09)

## 2010-12-15 ENCOUNTER — Ambulatory Visit (HOSPITAL_COMMUNITY): Payer: Self-pay | Admitting: Physical Therapy

## 2010-12-15 NOTE — Op Note (Signed)
NAME:  Colleen Lee, Colleen Lee                ACCOUNT NO.:  [id number]   MEDICAL RECORD NO.:  [id number]          PATIENT TYPE:  AMB   LOCATION:  DAY                           FACILITY:  [facility]   PHYSICIAN:  R. Roetta Sessions, M.D. DATE OF BIRTH:  Sep 08, 1955   DATE OF PROCEDURE:  01/18/2008  DATE OF DISCHARGE:                               OPERATIVE REPORT   INDICATIONS FOR PROCEDURE:  This is a 51-year lady with vague  postprandial upper abdominal discomfort that she describes as heartburn.  She takes aspirin powders in excess.  She has a positive family history  of colorectal cancer.  Her postprandial upper abdominal pain only lasts  for few minutes when she has these symptoms.  EGD and colonoscopy now  being done.  Colonoscopy done primarily first as screening maneuver.  Risks, benefits, alternatives, and limitations have been reviewed,  questions answered.  She is agreeable.  Please see documentation of  medical record.   PROCEDURE NOTE:  O2 saturation, blood pressure, pulse, and respiration  are monitored throughout the entire procedure.   CONSCIOUS SEDATION:  1. Versed 6 mg IV.  2. Demerol 25 mg IV.  3. Phenergan 12.5 mg dilute slow IV push to augment conscious sedation      all in divided doses.  4. Cetacaine spray for topical pharyngeal anesthesia.   INSTRUMENTATION:  Pentax video chip system.   FINDINGS:  Examination of tubular esophagus revealed no mucosal  abnormalities.  EG junction easily traversed.  Stomach:  Gas cavity was  empty and insufflated well with air.  The examination of the gastric  mucosa including retroflex view of the proximal stomach and  esophagogastric junction demonstrated normal-appearing mucosa.  Pylorus  patent, easily traversed.  Examination of the bulb and second portion  revealed extensive angry erythematous erosive mucosa involving a good  bit of the bulb.  There was no out bowel ulcer or infiltrating process,  otherwise D1 and D2 appeared  unremarkable.   THERAPEUTIC/DIAGNOSTIC MANEUVERS PERFORMED:  None.   The patient tolerated the procedures.   Colonoscopy and digital rectal exam revealed no abnormalities.   ENDOSCOPIC FINDINGS:  The prep was adequate.  Colon:  Colonic mucosa was  surveyed from the rectosigmoid junction through the left transverse  right colon, appendiceal orifice, ileocecal valve, and cecum.  These  structures were seen and photographed for the record.  From this level,  the scope was slowly and cautiously withdrawn.  All previous mentioned  mucosal surfaces were again seen.  The patient had a very elongated  tortuous colon which required a number of maneuvers and ultimately reach  the cecum with the patient in the supine position.  The mucosal surfaces  of colon otherwise appeared entirely normal except for two diminutive  polyps in the mid sigmoid which were cold biopsy/removed.  Scope was  pulled down the rectum.  The examination of the rectal mucosa including  retroflex view of anal verge demonstrated no abnormalities.  The patient  tolerated both procedures as well as reactive endoscopy.   IMPRESSION:  Esophagogastroduodenoscopy.  Normal esophagus, normal  stomach, marked inflammatory changes  of the bulb consistent with erosive  duodenitis, otherwise D1 and D2 appeared unremarkable.  Colonoscopy  findings normal rectum.  Long redundant colon, a diminutive sigmoid  polyp status post cold biopsy removed.   This lady has a history is extensive IV drug abuse and does drink a beer  daily.  Her CBC through the ulceration was completely normal; however,  LFTs demonstrated AST of 53, ALT of 64, total bilirubin of 1.2 and  indirect bilirubin of 1.0.  We will go ahead and check [name]  antibiotics with the hepatitis B surface antigen   Obtain H. pylori serologies.   I am not sure if today's findings on EGD explains this nice lady's  postprandial upper abdominal pain.  We will go ahead and do a   gallbladder ultrasound as well.  1. I have asked her to stop taking aspirin powders and start Prilosec      20 mg orally daily.  2. Follow-up on path.  3. Further recommendations to follow.      Jonathon Bellows, M.D.  Electronically Signed     RMR/MEDQ  D:  01/18/2008  T:  01/18/2008  Job:  [id number]   cc:   [place] No  Fax: [phone]

## 2010-12-15 NOTE — H&P (Signed)
NAME:  Colleen Lee, Colleen Lee                ACCOUNT NO.:  [id number]   MEDICAL RECORD NO.:  [id number]          PATIENT TYPE:  AMB   LOCATION:  DAY                           FACILITY:  [facility]   PHYSICIAN:  R. Roetta Sessions, M.D. DATE OF BIRTH:  04/10/1956   DATE OF ADMISSION:  DATE OF DISCHARGE:  LH                              HISTORY & PHYSICAL   REFERRING Atianna Lee:  Nadara Mustard, FNP, [facility].   REASON FOR CONSULTATION:  Abdominal bloating and reflux symptoms.   HISTORY OF PRESENT ILLNESS:  Colleen Lee is a pleasant 55 year old  African American female, sent over Colleen the courtesy of Nadara Mustard,  FNP, to further evaluate several-week history of intermittent  postprandial upper abdominal bloating and vague pain.  She has some  heartburn discomfort on her breast what she describes as heartburn.  She  does not have any odynophagia, dysphagia; denies any nausea, vomiting,  melena, hematochezia, constipation, or diarrhea.  She denies recent  weight loss.  Upper abdominal discomfort comes on primarily after eating  a meal within 10 minutes, only last about 6 minutes, then subsides.  She  has a habit of taking 5-10 BC powders a day, sometimes with Coca-Cola  for various aches and pains.  She is taking BC powders as recently as  today, but she admits she has been told to stop by her primary care  Roark Rufo previously.  Her gallbladder is in situ.  There is no history  of gastrointestinal illness.  She has never had her upper or lower GI  tract evaluated.  Specifically, she has never had a colonoscopy.  She  gives me a history of one brother with colorectal cancer, one brother  with [name] and liver cancer in the ICU in [facility] right now,  one brother with pancreatic cancer, and mother had breast cancer.   She does drink 1 can of beer daily.  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  She does have a tattoo.  She smokes 1-  1/2 packs  of cigarettes per day.   PAST MEDICAL HISTORY:  Significant for asthma.   PAST SURGERIES:  None.   CURRENT MEDICATIONS:  Tylenol p.r.n., BC powders, Proventil inhaler.   Occasional OTC Zantac for reflux.   FAMILY HISTORY:  As outlined above.   SOCIAL HISTORY:  The patient is married and has no children.  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  She smokes 1-1/2 packs of  cigarettes per day.  She drinks 1 beer daily, sometimes 2, but denies  ever more than 2 beers daily.  Currently, no illicit drugs, but  intravenous drug abuse in the past.  No chest pain.  No dyspnea  recently.  No fever, chills, or night sweats.  No change in weight.   PHYSICAL EXAMINATION:  VITAL SIGNS:  A 55 year old lady, resting  comfortably.  Weight 168, height 5 feet 6 inches, temp 98.3, BP 120/80,  and pulse 72.  SKIN:  Warm and dry.  She has a tattoo right forearm.  No cutaneous  stigmata of chronic liver disease.  HEENT:  No scleral icterus.  Conjunctivae pink.  Oral cavity no lesions.  CHEST:  Lungs are clear to auscultation.  HEART:  Regular rate and rhythm without murmur, gallop, or rub.  ABDOMEN:  Full, positive bowel sounds, soft, nontender.  No obvious mass  or organomegaly.  No fluid or wave shifting, dullness.  EXTREMITIES:  No edema.  RECTAL:  Deferred without colonoscopy.   IMPRESSION:  Colleen Lee is a pleasant 55 year old lady with vague  postprandial upper abdominal pain and symptoms she did describe as  heartburn.  She takes aspirin powders in excess.   She has a positive family history of GI malignancy as outlined above.  She has not had her lower GI tract evaluated.   Her postprandial upper abdominal discomfort is frequent, but short-lived  only lasting several minutes.  She is Colleen risk for peptic ulcer disease.  She could have a complicated gastroesophageal reflux disease, symptoms  will be somewhat atypical for gallbladder disease.  Further evaluation  is certainly  warranted.   RECOMMENDATIONS:  1. I have offered Colleen Lee an EGD and colonoscopy right off the      bed.  Risks, benefits, alternatives, and limitations have been      reviewed; questions answered; and she is agreeable.  I have asked      her to stop using headache powders.  2. We will also do a baseline CBC and hepatic profile.  We will make      further recommendations once EGD and colonoscopy are carried out.   I would like to thank Nadara Mustard, FNP, [facility]  Department for her kind referral.      R. Roetta Sessions, M.D.  Electronically Signed     RMR/MEDQ  D:  01/11/2008  T:  01/11/2008  Job:  [id number]   cc:   Nadara Mustard, FNP  [facility].

## 2010-12-15 NOTE — Assessment & Plan Note (Signed)
Colleen Lee, FOGARTY                 CHART#:  [id number]   DATE:  03/29/2008                       DOB:  15-Oct-1955   FOLLOWUP:  Pressure pain, abdominal pain, bloating, the diagnosis of HCV  infection, history of H. pylori related duodenitis, status post pylori  therapy.   The patient returns for a followup today.  I had last saw her on  01/18/2008, which time I did an EGD and colonoscopy.  She was found to  have erosive duodenitis.  No stigmata of portal hypertension.  Colonoscopy demonstrated many sigmoid polyps, which cold biopsy turned  to be hyperplastic.  She is due for high-risk screening colonoscopy in 5  years given her family history of colon cancer.  I asked her also to  back off on her aspirin powders and then started on Prilosec 20 mg  orally daily.  She tells in the interim, we obtained an ultrasound of  her abdomen, which demonstrated no gallbladder abnormalities of fatty  infiltration of liver.  She continues to have postprandial upper  abdominal discomfort and bloating.  She denies melena, constipation,  diarrhea, or hematochezia.  HCV antibody came back positive.  HCV RNA  quantitative came back positive with a log of 6.60.  She is in the  process of being set up to see the [facility] Hepatology folks down at [facility].   CURRENT MEDICATIONS:  See updated list.   ALLERGIES:  Penicillin.   SOCIAL HISTORY:  She continues to drink beer regularly.   PHYSICAL EXAMINATION:  VITAL SIGNS:  Today, she appears at baseline  weight 175, height 5 feet 6 inches, temperature 98.5, BP 108/72, and  pulse 76.  HEENT:  No scleral icterus.  Conjunctivae are pink.  CHEST:  Lungs are clear to auscultation.  ABDOMEN:  Full, positive bowel sounds, soft, and nontender.  No evidence  of mass or organomegaly.  No history of fluid wave or shifting dullness.  EXTREMITIES:  No edema.   ASSESSMENT:  1. Chronic [name] virus infection, her major risk factor      parenteral  drug abuse some 20 years ago when get her set to see the      specialty clinic folks down at [facility].  2. Postprandial upper abdominal discomfort, sounds more like biliary      etiology than anything else.  Gallbladder ultrasound was negative.   RECOMMENDATIONS:  1. We will go ahead and do a HIDA with the fatty meal to see if we can      document biliary dyskinesia.  2. Positive family history of colon cancer with negative colonoscopy      recently.  I will plan to bring her back in 5 years for high-risk      screening.       Jonathon Bellows, M.D.  Electronically Signed     RMR/MEDQ  D:  03/29/2008  T:  03/30/2008  Job:  [id number]

## 2010-12-15 NOTE — Assessment & Plan Note (Signed)
Colleen Lee, Colleen Lee                 CHART#:  [id number]   DATE:  04/30/2008                       DOB:  June 16, 1956   PRIMARY GASTROENTEROLOGIST:  Jonathon Bellows, MD.   PRIMARY CARE PHYSICIAN:  Marry Guan. Roxan Hockey, NP.   PROBLEM LIST:  1. Chronic [name] virus infection.  She has been referred to a      specialty clinic at [facility].  She has not yet received      her appointment time.  2. Helicobacter pylori, status post triple drug therapy.  3. Postprandial abdominal pain with a negative gallbladder ultrasound      and HIDA scan.  4. Asthma.  5. Normal colonoscopy by Dr. Jena Gauss on 01/18/2008.   SUBJECTIVE:  The patient is a 55 year old African American female.  She  comes in quite distraught today.  She is tearful and crying throughout  the exam.  Basically, she is quite concerned that she may have cancer or  something that has not been found.  She is having severe abdominal pain.  She tells me the pain feels like knots in her abdomen.  She has  intermittent pain, which lasts 5-10 minutes.  She has been taking  Vicodin, but since she ran out, she is having excruciating pain.  Her  pain does feel much better on Vicodin.  She did have nausea and vomiting  yesterday.  She does continue to drink about 2 cans of beer per day.  She denies any drug use.  She does continue to smoke.  She denies any  urinary symptoms.  Her weight is steadily increasing.  She complains of  significant abdominal bloating.  She feels as though she is going to  pop.  She is having 2 to 3 bowel movements a day.  They are loose,  nonbloody, and without mucus.  She does have [name] fatty liver.  Has not yet heard of an appointment time for her visit to hepatologist  at [facility].   CURRENT MEDICATIONS:  Proventil inhaler p.r.n. and Prilosec 20 mg daily.   ALLERGIES:  Penicillin.   OBJECTIVE:  VITAL SIGNS:  Weight 178 pounds, height 66 inches,  temperature 98.5, blood pressure  140/80, and pulse 56.  GENERAL:  She is a well-developed and well-nourished [place] American  female who is alert, oriented, anxious, tearful, and chronic throughout  the exam.  HEENT:  Sclerae clear and nonicteric.  Conjunctivae pink.  Oropharynx  pink and moist without any lesions.  CHEST:  Heart regular rate and rhythm.  Normal S1 and S2 without  murmurs, clicks, rubs, or gallops.  ABDOMEN:  Protuberant with positive bowel sounds x4.  Abdomen is quite  distended, is firm.  She does have some tenderness to her entire abdomen  on deep palpation.  There is no rebound, tenderness, or guarding.  EXTREMITIES:  Without clubbing or edema.   ASSESSMENT:  The patient is a 55 year old African American female with  history of chronic [name], awaiting [facility].   She has had a normal gallbladder workup, but continues to have  generalized abdominal pain with nausea, vomiting, and significant  abdominal bloating.  She also has had a mild bump in transaminases, AST,  and ALT, which could be related to her hepatitis.   I do feel we  need to proceed with CT scan as soon as possible given her  significant pain to rule out small bowel ileus and pancreatitis.   PLAN:  1. Lab data include CBC, LFTs, creatinine, amylase, lipase, urine      pregnancy, and urinalysis.  2. She is to go immediately to the emergency room if she develops      severe pain.  3. Vicodin 5/500 mg 1 p.o. q.4-6 h. p.r.n. severe pain, #15 no refill.       Lorenza Burton, N.P.  Electronically Signed     Kassie Mends, M.D.  Electronically Signed    KJ/MEDQ  D:  04/30/2008  T:  04/30/2008  Job:  [id number]   cc:   Okey Regal A. Roxan Hockey, NP

## 2010-12-18 ENCOUNTER — Ambulatory Visit (HOSPITAL_COMMUNITY): Payer: Self-pay

## 2010-12-21 ENCOUNTER — Ambulatory Visit (HOSPITAL_COMMUNITY): Payer: Self-pay | Admitting: Physical Therapy

## 2010-12-21 ENCOUNTER — Ambulatory Visit (INDEPENDENT_AMBULATORY_CARE_PROVIDER_SITE_OTHER): Payer: Self-pay | Admitting: Orthopedic Surgery

## 2010-12-21 ENCOUNTER — Encounter: Payer: Self-pay | Admitting: Orthopedic Surgery

## 2010-12-21 DIAGNOSIS — M23329 Other meniscus derangements, posterior horn of medial meniscus, unspecified knee: Secondary | ICD-10-CM

## 2010-12-21 DIAGNOSIS — S92919A Unspecified fracture of unspecified toe(s), initial encounter for closed fracture: Secondary | ICD-10-CM

## 2010-12-21 MED ORDER — HYDROCODONE-ACETAMINOPHEN 10-325 MG PO TABS: 1.0000 | ORAL_TABLET | ORAL | Status: DC | PRN

## 2010-12-21 NOTE — Progress Notes (Signed)
55 years old, has a LEFT tibial plateau fracture for past out fractured her LEFT great toe. X-ray shows a nondisplaced transverse fracture of the proximal phalanx of the LEFT great toe. She also had repeat films done of the tibial plateau. There's been no major change in position of that fracture. She has a medial meniscal tear, which were considering surgery.  Complains of mild pain in the great toe.  She is interested now in having the surgery on her LEFT knee.  Exam shows that the great toe is swollen and tender. No deformity is seen. She is ambulate in a postop shoe.  The toe was retained.  Followup next visit and discuss surgery possible preop arthroscopy, LEFT knee

## 2010-12-23 ENCOUNTER — Ambulatory Visit (HOSPITAL_COMMUNITY): Payer: Self-pay | Admitting: Physical Therapy

## 2010-12-29 ENCOUNTER — Ambulatory Visit (INDEPENDENT_AMBULATORY_CARE_PROVIDER_SITE_OTHER): Payer: Self-pay | Admitting: Orthopedic Surgery

## 2010-12-29 ENCOUNTER — Encounter: Payer: Self-pay | Admitting: Orthopedic Surgery

## 2010-12-29 DIAGNOSIS — M23305 Other meniscus derangements, unspecified medial meniscus, unspecified knee: Secondary | ICD-10-CM

## 2010-12-29 NOTE — Progress Notes (Signed)
Left knee pain   She is a 55 years old woman with the complaint of: left knee fracture  Diagnosis:Problem # 1: CLOSED FRACTURE OF UPPER END OF TIBIA (ICD-823.00)  TREATMENT:range of motion brace. Continue crutches partial weightbearing  DOI 08/11/10 stepped into a hole and fell.  Xrays [facility] left knee 08/11/10.  Complains of pain and swelling and popping in the LEFT knee.   MRI was obtained, and it showed that she had a torn meniscus as well and we will be doing arthroscopic surgery to correct that.  Family History  Problem Relation Age of Onset  . Cancer      FH  . Asthma      FH   No past medical history on file. No past surgical history on file. History   Social History  . Marital Status: Married    Spouse Name: N/A    Number of Children: N/A  . Years of Education: N/A   Occupational History  . umemployed     Social History Main Topics  . Smoking status: Current Everyday Smoker -- 2.0 packs/day    Types: Cigarettes  . Smokeless tobacco: Not on file  . Alcohol Use: No  . Drug Use: Not on file  . Sexually Active: Not on file   Other Topics Concern  . Not on file   Social History Narrative  . No narrative on file    General: The patient is normally developed, with normal grooming and hygiene. There are no gross deformities. The body habitus is normal   CDV: The pulse and perfusion of the extremities are normal   LYMPH: There is no gross lymphadenopathy in the extremities   Skin: There are no rashes, ulcers or cafe-au-lait spot   Psyche: The patient is alert, awake and oriented.  Mood is normal   Neuro:  The coordination and balance are normal.  Sensation is normal. Reflexes are 2+ and equal   Musculoskeletal  LEFT knee. Range of motion, limited by previous fractures and a leg brace.  She has medial joint line tenderness. Flexion of 90. Again knee is stable and collateral and sagittal plane. Muscle tone is mild atrophy.  Upper extremity exam  Inspection  and palpation revealed no abnormalities in the upper extremities.  Range of motion is full without contracture.  Motor exam is normal with grade 5 strength.  The joints are fully reduced without subluxation.  There is no atrophy or tremor and muscle tone is normal.  All joints are stable.   MRI shows medial meniscal tear. She also has medial plateau fracture with some depression.  Plan arthroscopy, LEFT knee, partial medial meniscectomy

## 2011-01-01 ENCOUNTER — Encounter (HOSPITAL_COMMUNITY): Payer: Self-pay | Attending: Orthopedic Surgery

## 2011-01-01 ENCOUNTER — Other Ambulatory Visit: Payer: Self-pay | Admitting: Anesthesiology

## 2011-01-01 ENCOUNTER — Other Ambulatory Visit: Payer: Self-pay | Admitting: Orthopedic Surgery

## 2011-01-01 ENCOUNTER — Other Ambulatory Visit: Payer: Self-pay | Admitting: Infectious Diseases

## 2011-01-01 DIAGNOSIS — Z01818 Encounter for other preprocedural examination: Secondary | ICD-10-CM | POA: Insufficient documentation

## 2011-01-01 DIAGNOSIS — Z01812 Encounter for preprocedural laboratory examination: Secondary | ICD-10-CM | POA: Insufficient documentation

## 2011-01-01 LAB — RAPID URINE DRUG SCREEN, HOSP PERFORMED
Amphetamines: NOT DETECTED
Barbiturates: NOT DETECTED
Benzodiazepines: NOT DETECTED
Cocaine: NOT DETECTED
Opiates: POSITIVE — AB
Tetrahydrocannabinol: POSITIVE — AB

## 2011-01-01 LAB — SURGICAL PCR SCREEN
MRSA, PCR: NEGATIVE
Staphylococcus aureus: NEGATIVE

## 2011-01-01 LAB — CBC
HCT: 39.7 % (ref 36.0–46.0)
Hemoglobin: 13.5 g/dL (ref 12.0–15.0)
MCH: 30.5 pg (ref 26.0–34.0)
MCHC: 34 g/dL (ref 30.0–36.0)
MCV: 89.8 fL (ref 78.0–100.0)
Platelets: 201 10*3/uL (ref 150–400)
RBC: 4.42 MIL/uL (ref 3.87–5.11)
RDW: 14.1 % (ref 11.5–15.5)
WBC: 7 10*3/uL (ref 4.0–10.5)

## 2011-01-01 LAB — BASIC METABOLIC PANEL
BUN: 8 mg/dL (ref 6–23)
CO2: 23 mEq/L (ref 19–32)
Calcium: 11 mg/dL — ABNORMAL HIGH (ref 8.4–10.5)
Chloride: 103 mEq/L (ref 96–112)
Creatinine, Ser: 0.72 mg/dL (ref 0.4–1.2)
GFR calc Af Amer: >60 mL/min (ref >60)
GFR calc non Af Amer: >60 mL/min (ref >60)
Glucose, Bld: 90 mg/dL (ref 70–99)
Potassium: 4 mEq/L (ref 3.5–5.1)
Sodium: 135 mEq/L (ref 135–145)

## 2011-01-08 ENCOUNTER — Ambulatory Visit (HOSPITAL_COMMUNITY)
Admission: RE | Admit: 2011-01-08 | Discharge: 2011-01-08 | Disposition: A | Discharge status: Home / Self Care | Payer: Self-pay | Source: Ambulatory Visit | Attending: Orthopedic Surgery | Admitting: Orthopedic Surgery

## 2011-01-08 DIAGNOSIS — M23329 Other meniscus derangements, posterior horn of medial meniscus, unspecified knee: Secondary | ICD-10-CM

## 2011-01-08 DIAGNOSIS — M23305 Other meniscus derangements, unspecified medial meniscus, unspecified knee: Secondary | ICD-10-CM

## 2011-01-11 ENCOUNTER — Ambulatory Visit (INDEPENDENT_AMBULATORY_CARE_PROVIDER_SITE_OTHER): Payer: Self-pay | Admitting: Orthopedic Surgery

## 2011-01-11 DIAGNOSIS — Z9889 Other specified postprocedural states: Secondary | ICD-10-CM

## 2011-01-11 MED ORDER — IBUPROFEN 800 MG PO TABS: 800.0000 mg | ORAL_TABLET | Freq: Three times a day (TID) | ORAL | Status: DC | PRN

## 2011-01-11 NOTE — Op Note (Signed)
Colleen Lee, Colleen Lee                    ACCOUNT NO.:  [id number]  MEDICAL RECORD NO.:  [id number]  LOCATION:                                 FACILITY:  PHYSICIAN:  Vickki Hearing, M.D.DATE OF BIRTH:  [birth year]  DATE OF PROCEDURE: DATE OF DISCHARGE:                              OPERATIVE REPORT   She is a 55 year old female who fractured her left tibial plateau, was treated with a range of motion brace and once she started a ambulating complained of popping in her left knee.  She had an MRI which showed she had a torn medial meniscus and we gave her surgical option because of her mechanical symptoms which she accepted.  Informed consent was done in the office.  PREOPERATIVE DIAGNOSIS:  Torn medial meniscus, left knee.  POSTOPERATIVE DIAGNOSIS:  Torn medial meniscus, left knee.  PROCEDURE:  Arthroscopy, left knee partial medial meniscectomy.  SURGEON:  Vickki Hearing, MD  ASSISTANT:  None.  ANESTHESIA:  LMA general. OPERATIVE FINDINGS:  Torn medial meniscus posterior horn.  Collateral ligaments and cruciate ligaments intact.  Lateral meniscus normal. Fracture healed.  DETAILS OF PROCEDURE:  The patient was identified as [name]. Cobb. Surgical site was marked by the patient, countersigned by the surgeon. Chart review was performed and updated and signed.  The patient was taken to surgery, started on a gram of vancomycin due to PENICILLIN allergy.  She had general anesthesia with an LMA technique that went smoothly.  Her left leg was placed in an arthroscopic leg holder.  Her right leg was padded and placed in a well leg padded position in flexion.  The knee was then prepped and draped with sterile technique followed by initiation and completion of the time-out.  After confirming the surgical site, the knee was injected with Marcaine 0.5% with dilute epinephrine solution including portal sites.  An #11 blade was used to create a lateral portal.  The scope was  placed in the joint and into the medial compartment.  Diagnostic arthroscopy was performed by touring the knee.  Medial portal was established with the assistance of a spinal needle and a tour of the knee was repeated.  With the probe in place, the meniscal tear was defined.  The medial meniscal tear was resected using a duckbill forceps.  Meniscal fragments were removed with a motorized shaver and the meniscus was balanced with an ArthroCare 50-degree wand.  Hemostasis was obtained with the same.  Knee was irrigated.  Debris was suctioned out of the knee.  3-0 nylon sutures were used to close the 2 portals.  The knee was injected with 45 mL of Marcaine with epinephrine using 0.5% Marcaine.  Sterile dressings, Ace wrap, and cryo cuff was placed and activated and the patient was extubated and taken to the recovery room in stable condition.  She is full weightbearing.  She will follow up on January 11, 2011.  She can start therapy next week on January 02, 2011, or January 13, 2011.  She is discharged with Norco 10-325 mg 1 q.4 p.r.n. for pain, #84, one refill and also with 30 tablets of Phenergan 25 one q.6 h. p.r.n.  nausea.     Vickki Hearing, M.D.     SEH/MEDQ  D:  01/08/2011  T:  01/09/2011  Job:  [id number]  Electronically Signed by Fuller Canada M.D. on 01/11/2011 12:52:20 PM

## 2011-01-11 NOTE — Progress Notes (Signed)
POST OP VIIST 1   DAY 3   SALK P MED MENISECT  CALLED BECAUSE LEG THROBBING   TOOK IBUPROFEN AND IT STOPPED   KNEE   PORTALS ARE CLEAN  FLEXION IS 70 DEGREES  KNEE SWELLING MILD   PT  3 WK F/U

## 2011-01-12 ENCOUNTER — Ambulatory Visit (HOSPITAL_COMMUNITY): Payer: Self-pay | Admitting: Physical Therapy

## 2011-01-12 ENCOUNTER — Emergency Department (HOSPITAL_COMMUNITY)
Admission: EM | Admit: 2011-01-12 | Discharge: 2011-01-12 | Disposition: A | Discharge status: Home / Self Care | Payer: Self-pay | Attending: Emergency Medicine | Admitting: Emergency Medicine

## 2011-01-12 DIAGNOSIS — K219 Gastro-esophageal reflux disease without esophagitis: Secondary | ICD-10-CM | POA: Insufficient documentation

## 2011-01-12 DIAGNOSIS — K5289 Other specified noninfective gastroenteritis and colitis: Secondary | ICD-10-CM | POA: Insufficient documentation

## 2011-01-12 DIAGNOSIS — J45909 Unspecified asthma, uncomplicated: Secondary | ICD-10-CM | POA: Insufficient documentation

## 2011-01-12 DIAGNOSIS — R109 Unspecified abdominal pain: Secondary | ICD-10-CM | POA: Insufficient documentation

## 2011-01-12 DIAGNOSIS — Z79899 Other long term (current) drug therapy: Secondary | ICD-10-CM | POA: Insufficient documentation

## 2011-01-12 DIAGNOSIS — R42 Dizziness and giddiness: Secondary | ICD-10-CM | POA: Insufficient documentation

## 2011-01-12 LAB — COMPREHENSIVE METABOLIC PANEL
ALT: 19 U/L (ref 0–35)
AST: 14 U/L (ref 0–37)
Albumin: 3.7 g/dL (ref 3.5–5.2)
Alkaline Phosphatase: 72 U/L (ref 39–117)
BUN: 8 mg/dL (ref 6–23)
CO2: 25 mEq/L (ref 19–32)
Calcium: 11.2 mg/dL — ABNORMAL HIGH (ref 8.4–10.5)
Chloride: 101 mEq/L (ref 96–112)
Creatinine, Ser: 0.5 mg/dL (ref 0.4–1.2)
GFR calc Af Amer: >60 mL/min (ref >60)
GFR calc non Af Amer: >60 mL/min (ref >60)
Glucose, Bld: 108 mg/dL — ABNORMAL HIGH (ref 70–99)
Potassium: 3.6 mEq/L (ref 3.5–5.1)
Sodium: 137 mEq/L (ref 135–145)
Total Bilirubin: 1 mg/dL (ref 0.3–1.2)
Total Protein: 8.8 g/dL — ABNORMAL HIGH (ref 6.0–8.3)

## 2011-01-12 LAB — URINALYSIS, ROUTINE W REFLEX MICROSCOPIC
Glucose, UA: NEGATIVE mg/dL
Hgb urine dipstick: NEGATIVE
Ketones, ur: 40 mg/dL — AB
Leukocytes, UA: NEGATIVE
Nitrite: NEGATIVE
Specific Gravity, Urine: >1.03 — ABNORMAL HIGH (ref 1.005–1.030)
Urobilinogen, UA: 1 mg/dL (ref 0.0–1.0)
pH: 6 (ref 5.0–8.0)

## 2011-01-12 LAB — URINE MICROSCOPIC-ADD ON

## 2011-01-12 LAB — CBC
HCT: 40.2 % (ref 36.0–46.0)
Hemoglobin: 14.1 g/dL (ref 12.0–15.0)
MCH: 31.2 pg (ref 26.0–34.0)
MCHC: 35.1 g/dL (ref 30.0–36.0)
MCV: 88.9 fL (ref 78.0–100.0)
Platelets: 212 10*3/uL (ref 150–400)
RBC: 4.52 MIL/uL (ref 3.87–5.11)
RDW: 13.6 % (ref 11.5–15.5)
WBC: 7.2 10*3/uL (ref 4.0–10.5)

## 2011-01-12 LAB — DIFFERENTIAL
Basophils Absolute: 0 10*3/uL (ref 0.0–0.1)
Basophils Relative: 0 % (ref 0–1)
Eosinophils Absolute: 0 10*3/uL (ref 0.0–0.7)
Eosinophils Relative: 0 % (ref 0–5)
Lymphocytes Relative: 20 % (ref 12–46)
Lymphs Abs: 1.4 10*3/uL (ref 0.7–4.0)
Monocytes Absolute: 0.7 10*3/uL (ref 0.1–1.0)
Monocytes Relative: 10 % (ref 3–12)
Neutro Abs: 5.1 10*3/uL (ref 1.7–7.7)
Neutrophils Relative %: 70 % (ref 43–77)

## 2011-01-12 LAB — LIPASE, BLOOD: Lipase: 17 U/L (ref 11–59)

## 2011-01-25 ENCOUNTER — Ambulatory Visit (HOSPITAL_COMMUNITY)
Admission: RE | Admit: 2011-01-25 | Discharge: 2011-01-25 | Disposition: A | Discharge status: Home / Self Care | Payer: Self-pay | Source: Ambulatory Visit | Attending: Family Medicine | Admitting: Family Medicine

## 2011-01-25 DIAGNOSIS — M25669 Stiffness of unspecified knee, not elsewhere classified: Secondary | ICD-10-CM | POA: Insufficient documentation

## 2011-01-25 DIAGNOSIS — M25569 Pain in unspecified knee: Secondary | ICD-10-CM | POA: Insufficient documentation

## 2011-01-25 DIAGNOSIS — R262 Difficulty in walking, not elsewhere classified: Secondary | ICD-10-CM | POA: Insufficient documentation

## 2011-01-25 DIAGNOSIS — M6281 Muscle weakness (generalized): Secondary | ICD-10-CM | POA: Insufficient documentation

## 2011-01-25 DIAGNOSIS — IMO0001 Reserved for inherently not codable concepts without codable children: Secondary | ICD-10-CM | POA: Insufficient documentation

## 2011-01-29 ENCOUNTER — Other Ambulatory Visit: Payer: Self-pay | Admitting: Orthopedic Surgery

## 2011-01-29 ENCOUNTER — Ambulatory Visit (HOSPITAL_COMMUNITY): Payer: Self-pay

## 2011-02-01 ENCOUNTER — Ambulatory Visit (HOSPITAL_COMMUNITY)
Admission: RE | Admit: 2011-02-01 | Discharge: 2011-02-01 | Disposition: A | Discharge status: Home / Self Care | Payer: Self-pay | Source: Ambulatory Visit | Attending: Family Medicine | Admitting: Family Medicine

## 2011-02-01 ENCOUNTER — Other Ambulatory Visit: Payer: Self-pay | Admitting: *Deleted

## 2011-02-01 DIAGNOSIS — M25669 Stiffness of unspecified knee, not elsewhere classified: Secondary | ICD-10-CM | POA: Insufficient documentation

## 2011-02-01 DIAGNOSIS — M25569 Pain in unspecified knee: Secondary | ICD-10-CM | POA: Insufficient documentation

## 2011-02-01 DIAGNOSIS — M6281 Muscle weakness (generalized): Secondary | ICD-10-CM | POA: Insufficient documentation

## 2011-02-01 DIAGNOSIS — R52 Pain, unspecified: Secondary | ICD-10-CM

## 2011-02-01 DIAGNOSIS — IMO0001 Reserved for inherently not codable concepts without codable children: Secondary | ICD-10-CM | POA: Insufficient documentation

## 2011-02-01 DIAGNOSIS — R262 Difficulty in walking, not elsewhere classified: Secondary | ICD-10-CM | POA: Insufficient documentation

## 2011-02-01 MED ORDER — HYDROCODONE-ACETAMINOPHEN 7.5-325 MG PO TABS: 1.0000 | ORAL_TABLET | ORAL | Status: DC | PRN

## 2011-02-02 ENCOUNTER — Ambulatory Visit (INDEPENDENT_AMBULATORY_CARE_PROVIDER_SITE_OTHER): Payer: Self-pay | Admitting: Orthopedic Surgery

## 2011-02-02 DIAGNOSIS — M23305 Other meniscus derangements, unspecified medial meniscus, unspecified knee: Secondary | ICD-10-CM

## 2011-02-02 NOTE — Patient Instructions (Signed)
Come back in 6 weeks recheck left knee

## 2011-02-02 NOTE — Progress Notes (Signed)
   This is a postoperative visit status post arthroscopy and partial medial meniscectomy after a treated tibial plateau fracture.  She is ambulating with a crutch. She says her knee is feeling better. Her portals sites are clean. She has some scarring. There and some pain in the front of the knee, but otherwise she is doing very well.  Recommendation continue therapy. Continue hydrocodone 7.5. Follow up in 6 weeks

## 2011-02-04 ENCOUNTER — Ambulatory Visit (HOSPITAL_COMMUNITY)
Admission: RE | Admit: 2011-02-04 | Discharge: 2011-02-04 | Disposition: A | Discharge status: Home / Self Care | Payer: Self-pay | Source: Ambulatory Visit | Attending: Family Medicine | Admitting: Family Medicine

## 2011-02-09 ENCOUNTER — Telehealth (HOSPITAL_COMMUNITY): Payer: Self-pay

## 2011-02-09 ENCOUNTER — Inpatient Hospital Stay (HOSPITAL_COMMUNITY): Admission: RE | Admit: 2011-02-09 | Discharge: 2011-02-09 | Payer: Self-pay | Source: Ambulatory Visit

## 2011-02-10 ENCOUNTER — Other Ambulatory Visit: Payer: Self-pay | Admitting: Orthopedic Surgery

## 2011-02-10 DIAGNOSIS — M25569 Pain in unspecified knee: Secondary | ICD-10-CM

## 2011-02-10 DIAGNOSIS — R52 Pain, unspecified: Secondary | ICD-10-CM

## 2011-02-12 ENCOUNTER — Ambulatory Visit (HOSPITAL_COMMUNITY)
Admission: RE | Admit: 2011-02-12 | Discharge: 2011-02-12 | Disposition: A | Discharge status: Home / Self Care | Payer: Self-pay | Source: Ambulatory Visit | Attending: Family Medicine | Admitting: Family Medicine

## 2011-02-12 DIAGNOSIS — M25669 Stiffness of unspecified knee, not elsewhere classified: Secondary | ICD-10-CM | POA: Insufficient documentation

## 2011-02-12 DIAGNOSIS — M2430 Pathological dislocation of unspecified joint, not elsewhere classified: Secondary | ICD-10-CM | POA: Insufficient documentation

## 2011-02-12 DIAGNOSIS — M25569 Pain in unspecified knee: Secondary | ICD-10-CM | POA: Insufficient documentation

## 2011-02-12 NOTE — Progress Notes (Signed)
  Patient Name: Colleen Lee MRN: [id number] Today's Date: 02/12/2011       Physical Therapy Treatment Note  Time In: 10:56 Time Out: 11:46 Charges: gait x 7 min Therex 40 min Visits: 3/5  Subjective: no pain L knee today, noticed increase swelling LE following standing for long periods of time.  Big toe hurting me on bottom of foot, probably need to see my dr.  Pain meds taken at 9 this AM.   Objective: ambulate no AD; no brace L LE; antalgic gait; AROM 0-118 degrees   Exercises/Treatments: Gait for equal stride length, heel --toe Heel raise Toe Raise Functional squats 4" step up 4" lateral step up 4" step down Knee Flexion Treadmill 1.0 x TKE 10x 5"  Sidelying hip abd 10 reps  Supine: Quad sets 10x 5" TKE 10x5" SLR 10reps Heel slides 10 reps PROM for flexion 3x 30" Active H/S st. With rope 3x 30"  Prone: Hip ext 10 reps Knee flexion 10 reps  Assessment: Gait training required vc L LE with heel--toe gait and equal stride length.  Began treadmill at 1.0 for equal stride length with vc to advance L LE for full knee extension to normalize gait.  Began standing ex with demonstration/vc for technique, pt completed without diff.  Weak eccentric quad control presented with step down.  Plan: continue with current POC, progress strength  Becky Sax, PTA  [name] 02/12/2011, 11:15 AM

## 2011-02-17 ENCOUNTER — Ambulatory Visit (HOSPITAL_COMMUNITY)
Admission: RE | Admit: 2011-02-17 | Discharge: 2011-02-17 | Disposition: A | Discharge status: Home / Self Care | Payer: Self-pay | Source: Ambulatory Visit | Attending: Family Medicine | Admitting: Family Medicine

## 2011-02-17 DIAGNOSIS — M25669 Stiffness of unspecified knee, not elsewhere classified: Secondary | ICD-10-CM

## 2011-02-17 DIAGNOSIS — M25569 Pain in unspecified knee: Secondary | ICD-10-CM

## 2011-02-17 NOTE — Progress Notes (Signed)
Physical Therapy Treatment/Progress Note Patient Name: Colleen Lee [id number] Date: 02/17/2011 Time In: 1030 Time out 1115 Visit: 7:7 Initial Eval: 01/25/11.  Re-eval: Today 02/17/11 Charges: 1 MMT, 1 ROM,   Subjective:    [name] response to therapy: Pt reports her biggest concern is throbbing in her L knee.  Able to stand 15-20 minutes w/o crutches.  Walk: 1 hour in the house without crutches.  I feel like I can walk around the block but I am scared I will get stuck and won't be able to get back.  When I sit my knee gets numb (to her gastroc region) and it has increased swelling.  Sit comfortably about an hour and then stiff when she gets up.  Swelling goes down when she elevates her foot. She is icing it 3x/day for pain control.  Still taking pain medication 2x a day.  Pain is worse in the am and relieves with pain medicine.  Average pain is 7/10.  No pain currently because she took pain medicine.  When she does take it, her pain is gone and reports she is able to do a lot more like scrub her floor.    Lower Extremity Functional Scale: 18/80 (0/0)  Objective:   Current condition: Pt ambulates independently with antalgic gait to LLE.  Has decreased knee flexion and stance time on LLE.   (  )  = Initial Evaluation measurements   MMT  AROM  PROM  Hip flexion 4/5 (3/5)    Gluteus Medius 4+/5 (4/5)    Gluteus Maximus  4/5 (3+/5)     Adductor 3+/5    Knee Extension 4+/5 (2+/5) 3 degrees 0 degrees  Knee Flexion  3+/5 (2+/5) 110 degrees 120 degrees  Lumbar 5/5 (5/5)     Abdominals 5/5 (4/5)    5 STS : 16.5 sec w/o UE support (22.6 sec w/UE support)  L SLS: 6 sec.  Tandem stance L foot posterior: WNL  (3 sec)  Ascend/descend stairs with 1 handrail with step to pattern with increased difficulty with ascending stairs.   Exercise/Treatments  Sitting Hip flexion to demonstrate lifting foot into car. Standing LLE Heel raise 2x10 Toe Raise 2x10 Functional squats 2x10  [place] 2 RT with LLE  ascending and LLE descending  Knee Flexion 2x10 4 #     Assessment:   Summary/analysis of evaluation: Pt is progressing well with improved strength, AROM and balance.  Pt continues to have body structure impairments including increased pain when she is not taking her pain medication, activity tolerance, gait abnormalities and impaired perceived functional ability.  Pt will continue to benefit from skilled outpatient PT in order to address functional muscle strength and endurance and continue to increase power, AROM, correct gait abnormalities, and improve functional ability to maximize function with community activities.   Progress toward previous goals:  Plan:   Goals Short-term goals (STG): 2 weeks 1. Pt will be independent with an advanced HEP. 2. Pt will perform single leg balance x 30 sec on LLE Long-term goals (LTG): 4 weeks 1. Pt will improve her LEFS score by 9 points for improved perceived functional ability.  2. Pt will improve LE power by ascending and descending stairs with reciprocal pattern with 1 handrail 3. Pt will improve her STS time w/o UE support in 15 sec.  Prognosis to achieve goals: Good  Treatment Principle of treatment/treatment today: To improve power, strength and gait.   Plan Treatment plan with rationale:to continue with treatment to address power,  functional strength and activities, balance.  Recommendations: Cont w/PT for 4 weeks.  Reason for plan status: To improve function      End of Session Patient Active Problem List  Diagnoses  . CLOSED FRACTURE OF UPPER END OF TIBIA  . Meniscus, medial, derangement  . Pain in joint, lower leg  . Stiffness of joint, not elsewhere classified, lower leg  . Pathological dislocation       Colleen Lee 02/17/2011, 11:24 AM

## 2011-02-18 ENCOUNTER — Telehealth (HOSPITAL_COMMUNITY): Payer: Self-pay

## 2011-02-18 ENCOUNTER — Inpatient Hospital Stay (HOSPITAL_COMMUNITY): Admission: RE | Admit: 2011-02-18 | Payer: Self-pay | Source: Ambulatory Visit

## 2011-02-22 ENCOUNTER — Other Ambulatory Visit: Payer: Self-pay | Admitting: Orthopedic Surgery

## 2011-02-22 DIAGNOSIS — R52 Pain, unspecified: Secondary | ICD-10-CM

## 2011-02-23 ENCOUNTER — Ambulatory Visit (HOSPITAL_COMMUNITY)
Admission: RE | Admit: 2011-02-23 | Discharge: 2011-02-23 | Disposition: A | Discharge status: Home / Self Care | Payer: Self-pay | Source: Ambulatory Visit | Attending: Family Medicine | Admitting: Family Medicine

## 2011-02-23 ENCOUNTER — Ambulatory Visit (HOSPITAL_COMMUNITY): Payer: Self-pay | Admitting: Physical Therapy

## 2011-02-23 NOTE — Progress Notes (Signed)
Physical Therapy Treatment Patient Name: Colleen Lee ZOXWR'U Date: 02/23/2011  Time In: 4:22 Time out : 5:04  Visit: 1 of 8 (8th tx) Initial Eval: 01/25/11. Re-eval: 02/17/11   Charges: Therex 23'; E- stim with ice 15'   HPI: Symptoms/Limitations Symptoms: 7/10 pain in whole left leg. Pain Assessment Currently in Pain?: Yes Pain Score:   7 Pain Location: Leg Pain Orientation: Left Pain Frequency: Intermittent Pain Relieving Factors: Propping Leg and iceing. Also taking pain meds.   Exercise/Treatments Lumbar Stretches Active Hamstring Stretch: 3 reps;30 seconds Hip Stretches Active Hamstring Stretch: 3 reps;30 seconds Hip Exercises Heel Slides: 10 reps Straight Leg Raises: Strengthening;Left;10 reps;Supine Hamstring Curl: 10 reps (prone) Hip Extension: Strengthening;Left;10 reps (prone) Hip ABduction/ADduction: Strengthening;Left;10 reps;Sidelying Knee Stretches Active Hamstring Stretch: 3 reps;30 seconds Knee Exercises Quad Sets: 10 reps;Supine (5"ea) Straight Leg Raises: Strengthening;Left;10 reps;Supine Heel Slides: 10 reps Hip Extension: Strengthening;Left;10 reps (prone) Hamstring Curl: 10 reps (prone) Hip ABduction/ADduction: Strengthening;Left;10 reps;Sidelying Modalities Modalities: Electrical Stimulation;Cryotherapy Cryotherapy Number Minutes Cryotherapy: 15 Minutes Cryotherapy Location: Knee (Left) Type of Cryotherapy: Ice pack Pharmacologist Location: L knee Statistician Action: Midwife Parameters: L8 hi/lo sweep Statistician Goals: Edema;Pain (Each exercise done once, repeated exercise due to computer error)   Goals PT Short Term Goals Short Term Goal 1 Progress: Progressing toward goal Short Term Goal 2 Progress: Progressing toward goal Short Term Goal 3 Progress: Progressing toward goal Short Term Goal 4 Progress: Progressing toward goal PT Long Term Goals Long Term Goal 1  Progress: Progressing toward goal Long Term Goal 2 Progress: Progressing toward goal Long Term Goal 3 Progress: Progressing toward goal Long Term Goal 4 Progress: Progressing toward goal End of Session Patient Active Problem List  Diagnoses  . CLOSED FRACTURE OF UPPER END OF TIBIA  . Meniscus, medial, derangement  . Pain in joint, lower leg  . Stiffness of joint, not elsewhere classified, lower leg  . Pathological dislocation   PT - End of Session Activity Tolerance: Patient tolerated treatment well General Behavior During Session: [facility] for tasks performed Cognition: [facility] for tasks performed PT Assessment and Plan Clinical Impression Statement: Standing therex held this tx secondary to increased pain. Pt reports pain decrease to 4/10 after ice and IFES. PT Treatment/Interventions: Therapeutic exercise;Other (comment) (Ice and IFES) PT Plan: Continue to progress as pain allows.  Seth Bake [facility] 02/23/2011, 5:13 PM

## 2011-02-24 ENCOUNTER — Ambulatory Visit (HOSPITAL_COMMUNITY)
Admission: RE | Admit: 2011-02-24 | Discharge: 2011-02-24 | Disposition: A | Discharge status: Home / Self Care | Payer: Self-pay | Source: Ambulatory Visit | Attending: Family Medicine | Admitting: Family Medicine

## 2011-02-24 NOTE — Progress Notes (Signed)
Physical Therapy Treatment Patient Name: Colleen Lee ZOXWR'U Date: 02/24/2011  Time In: 11:03 Time Out: 11:55 Visit #: 2 of 8 Next Re-eval: 03/20/2011 Charge:  therex 45 min  Subjective: Symptoms/Limitations Symptoms: 1-2/10 left LE, felt really good following last tx., did put ice on this morning before this session. Pain Assessment Currently in Pain?: Yes Pain Score:   1 Pain Location: Leg Pain Orientation: Left Pain Frequency: Intermittent Pain Relieving Factors: IFES, ice  Objective: slight antalgic gait  Exercise/Treatments STANDING: Squats 15 reps Forward step up 15 reps Lateral step up 15 reps Step down 15 reps [place] 2 RT TKE with blue band 15x 5" L SLS 52" R S/L L Abd 15 x SUPINE SLR 15 reps Quad sets 15x 5" TKE 15x 5" Heel slide with 5" hold each direction 15 reps Active hs st 3x 30" PRONE Hip ext 15 reps Knee flex 15 reps    Lumbar Stretches Active Hamstring Stretch: 3 reps;30 seconds Stability Exercises Heel Raises: 15 reps Hip Stretches Active Hamstring Stretch: 3 reps;30 seconds Hip Exercises Heel Slides: 15 reps Knee Flexion: 15 reps;Left (prone) Straight Leg Raises: 15 reps;Strengthening;Left;Supine Hamstring Curl: 15 reps Hip Extension: 15 reps;Left;Strengthening Hip ABduction/ADduction: 15 reps;Strengthening;Left;Supine Additional Hip Exercises SLS: 52" max of 3 Lateral Step Up: Step Height: 4";15 reps Forward Step Up: Step Height: 4";15 reps Stairs: 2 RT Knee Stretches Active Hamstring Stretch: 3 reps;30 seconds Knee Exercises Quad Sets: 15 reps;Supine (5" holds) Knee Flexion: 15 reps;Left (prone) Knee Extension: 15 reps;Left;AROM;Supine;Standing (TKE supine and supine 15x 5") Straight Leg Raises: 15 reps;Strengthening;Left;Supine Heel Raises: 15 reps Heel Slides: 15 reps Hip Extension: 15 reps;Left;Strengthening Hamstring Curl: 15 reps Hip ABduction/ADduction: 15 reps;Strengthening;Left;Supine Additional Knee  Exercises Lateral Step Up: Step Height: 4";15 reps Forward Step Up: Step Height: 4";15 reps Functional Squat: 15 reps Stairs: 2 RT SLS: 52" max of 3 Ankle Exercises Heel Raises: 15 reps Additional Ankle Exercises SLS: 52" max of 3 Balance Exercises Stairs: 2 RT Heel Raises: 15 reps    Goals PT Short Term Goals Short Term Goal 4 Progress: Met PT Long Term Goals Long Term Goal 2 Progress: Progressing toward goal Long Term Goal 3 Progress: Progressing toward goal End of Session Patient Active Problem List  Diagnoses  . CLOSED FRACTURE OF UPPER END OF TIBIA  . Meniscus, medial, derangement  . Pain in joint, lower leg  . Stiffness of joint, not elsewhere classified, lower leg  . Pathological dislocation   PT - End of Session Activity Tolerance: Patient tolerated treatment well General Behavior During Session: [facility] for tasks performed Cognition: [facility] for tasks performed PT Assessment and Plan Clinical Impression Statement: Returned to full POC, pt presented with weak quad eccentric control descending stairs.  No increase pain at end of session, pt plans on icing knee at home watching her soap operas.  PT Plan: Continue with POC  [name] 02/24/2011, 12:04 PM

## 2011-03-02 ENCOUNTER — Ambulatory Visit (HOSPITAL_COMMUNITY)
Admission: RE | Admit: 2011-03-02 | Discharge: 2011-03-02 | Disposition: A | Discharge status: Home / Self Care | Payer: Self-pay | Source: Ambulatory Visit | Attending: Family Medicine | Admitting: Family Medicine

## 2011-03-02 NOTE — Progress Notes (Signed)
Physical Therapy Treatment Patient Name: Colleen Lee ZOXWR'U Date: 03/02/2011  Time In: 11:00 Time Out: 11:43 Visit #: 3 of 8  Next Re-eval: 03/20/2011  Charge: therex 35 min   HPI: Symptoms/Limitations Symptoms: No pain. Pain Assessment Currently in Pain?: No/denies    Exercise/Treatments  Warm up: Stationary Bike: 6'@2 .0  Standing: Heel Raises: 15 reps Lateral Step Up: Step Height: 4";15 reps Forward Step Up: Step Height: 4";15 reps Stairs: 2 RT (Recips) Rocker Board: Other (comment) (15x) Functional Squat: 15 reps SLS with Vectors: 5x5"each  Supine: Heel Slides:  (HEP) Straight Leg Raises: 15 reps;Strengthening;Left;Supine Bridges: 10 reps;Both;Supine Quad Sets: 15 reps;Supine (5"each) Knee Extension: 15 reps;Strengthening;Supine (TKE) Straight Leg Raises: 15 reps;Strengthening;Left;Supine Heel Slides:  (HEP)  Side lying: Hip ABduction: Left;Strengthening;15 reps;Sidelying (3#) Hip ADduction: Left;10 reps;Sidelying (3#)  Prone: Hamstring Curl: 15 reps (w/3# in prone) Hip Extension: 10 reps;Left (prone w/3#)   Goals PT Short Term Goals Short Term Goal 1: Pt will be independent in HEP in order to maximize therapeutic effect. Short Term Goal 1 Progress: Progressing toward goal Short Term Goal 2: Pt will ambulate independently x 500 ft. for household ambulation. Short Term Goal 2 Progress: Progressing toward goal Short Term Goal 3: Pt will report pain less than 4/10 for 50% of her day while WB. Short Term Goal 3 Progress: Progressing toward goal Short Term Goal 4: Pt will demonstrate L SLS static balance x 20 sec. Short Term Goal 4 Progress: Met PT Long Term Goals Long Term Goal 1: Pt will demonstrate 5 sit to stands without UE support in 15 seconds for improved LE power. Long Term Goal 1 Progress: Progressing toward goal Long Term Goal 2: Pt will improve knee extension and flexion strength to WNL for proper gait biomechanics. Long Term Goal 2 Progress:  Progressing toward goal Long Term Goal 3: Pt will report pain less than or equal to 3/10 for 75% of her day for improved quality of life. Long Term Goal 3 Progress: Progressing toward goal Long Term Goal 4: Pt will tandem walk on dynamic surfaces x 30 ft for improve safety while walking outside in her yard. Long Term Goal 4 Progress: Progressing toward goal End of Session Patient Active Problem List  Diagnoses  . CLOSED FRACTURE OF UPPER END OF TIBIA  . Meniscus, medial, derangement  . Pain in joint, lower leg  . Stiffness of joint, not elsewhere classified, lower leg  . Pathological dislocation   PT - End of Session Activity Tolerance: Patient tolerated treatment well General Behavior During Session: [facility] for tasks performed Cognition: [facility] for tasks performed PT Assessment and Plan Clinical Impression Statement: Pt completes therex with minimal difficulty and without complaint of pain. Pt able to ascend stairs with reicprocal pattaern after VCs to encourage. Pt Descends stairs reciprocally without difficulty. PT Treatment/Interventions: Therapeutic exercise PT Plan: Continue to progress. Increase reps next tx.  Seth Bake [facility] 03/02/2011, 11:54 AM

## 2011-03-04 ENCOUNTER — Ambulatory Visit (HOSPITAL_COMMUNITY): Payer: Self-pay | Admitting: Physical Therapy

## 2011-03-09 ENCOUNTER — Telehealth (HOSPITAL_COMMUNITY): Payer: Self-pay

## 2011-03-09 ENCOUNTER — Ambulatory Visit (HOSPITAL_COMMUNITY)
Admission: RE | Admit: 2011-03-09 | Discharge: 2011-03-09 | Disposition: A | Discharge status: Home / Self Care | Payer: Self-pay | Source: Ambulatory Visit | Attending: Family Medicine | Admitting: Family Medicine

## 2011-03-09 DIAGNOSIS — M6281 Muscle weakness (generalized): Secondary | ICD-10-CM | POA: Insufficient documentation

## 2011-03-09 DIAGNOSIS — IMO0001 Reserved for inherently not codable concepts without codable children: Secondary | ICD-10-CM | POA: Insufficient documentation

## 2011-03-09 DIAGNOSIS — M25569 Pain in unspecified knee: Secondary | ICD-10-CM | POA: Insufficient documentation

## 2011-03-09 DIAGNOSIS — M25669 Stiffness of unspecified knee, not elsewhere classified: Secondary | ICD-10-CM | POA: Insufficient documentation

## 2011-03-09 DIAGNOSIS — R262 Difficulty in walking, not elsewhere classified: Secondary | ICD-10-CM | POA: Insufficient documentation

## 2011-03-09 NOTE — Progress Notes (Signed)
Physical Therapy Treatment Patient Name: Colleen Lee Date: 03/09/2011  Time In: 11:04  Time Out: 11:33  Visit #: 4 of 8  Next Re-eval: 03/18/2011 Next MD appt. : 03/18/11  Charge: therex 24 min   SUBJECTIVE: Symptoms/Limitations Symptoms: No pain; doing well Pain Assessment Currently in Pain?: No/denies  Precautions/Restrictions :  None noted  OBJECTIVE: Stationary Bike: 6'@3 .0  Standing:  Heel Raises: 20 reps  Lateral Step Up: Step Height: 6";10 reps  Forward Step Up: Step Height: 6";10 reps Forward step down:  step height 6", 10 reps  Stairs: 2 RT, reciprocally (not done today due to time)  Functional Squat: 15 reps  SLS with Vectors: 5x5"each  Supine:  Heel Slides: (HEP)  Straight Leg Raises: 15 reps 3#  Bridges: 15 reps  Heel Slides: (HEP)  Side lying:  Hip ABduction: 3# 15 reps  Hip ADduction: 3# 15 reps  Prone:  Hamstring Curl: 3# 15 reps  Hip Extension:  3# 15 reps    End of Session Patient Active Problem List  Diagnoses  . CLOSED FRACTURE OF UPPER END OF TIBIA  . Meniscus, medial, derangement  . Pain in joint, lower leg  . Stiffness of joint, not elsewhere classified, lower leg  . Pathological dislocation   PT - End of Session Activity Tolerance: Patient tolerated treatment well General Behavior During Session: [facility] for tasks performed Cognition: [facility] for tasks performed PT Assessment and Plan Clinical Impression Statement: Pt. able to progress to 6" step without difficulty.  Returns to MD 03/18/11 PT Treatment/Interventions: Therapeutic exercise PT Plan: Re-eval prior to 8/16 MD appt.  Progress to Cybex machines for quad/ham strengthening next visit.  Bascom Levels, Colleen Lee 03/09/2011, 11:37 AM

## 2011-03-10 ENCOUNTER — Other Ambulatory Visit: Payer: Self-pay | Admitting: Orthopedic Surgery

## 2011-03-10 DIAGNOSIS — R52 Pain, unspecified: Secondary | ICD-10-CM

## 2011-03-11 ENCOUNTER — Ambulatory Visit (HOSPITAL_COMMUNITY)
Admission: RE | Admit: 2011-03-11 | Discharge: 2011-03-11 | Disposition: A | Discharge status: Home / Self Care | Payer: Self-pay | Source: Ambulatory Visit | Attending: Family Medicine | Admitting: Family Medicine

## 2011-03-11 DIAGNOSIS — M25669 Stiffness of unspecified knee, not elsewhere classified: Secondary | ICD-10-CM

## 2011-03-11 DIAGNOSIS — M25569 Pain in unspecified knee: Secondary | ICD-10-CM

## 2011-03-11 NOTE — Progress Notes (Signed)
Physical Therapy Treatment Patient Name: Colleen Lee ZOXWR'U Date: 03/11/2011  Time In: 11:05 Time Out: 11:53 Visit #: 5 out of 8 Next Re-eval: prior md appt 03/18/2011 Charge: therex 40  Subjective: Symptoms/Limitations Symptoms: No pain, stiffness in knee. Pain Assessment Currently in Pain?: No/denies  Objective:  Exercise/Treatments  Stationary Bike: 6'@3 .0  Standing:  Heel Raises: 1 RT Toe walk 1RT Lateral Step Up: Step Height: 6";10 reps  Forward Step Up: Step Height: 6";10 reps  Forward step down: step height 6", 10 reps  Stairs: 2 RT, reciprocally 1 HR Functional Squat: 15 reps  SLS with Vectors: 5x5"each  Sitting: Cybex hs 2 PL 10 x Quad 3 PL 10x Supine:  Straight Leg Raises: 15 reps 3#  Bridges: 15 reps  Side lying:  Hip ABduction: 3# 15 reps  Hip ADduction: 3# 15 reps  Prone:  Hamstring Curl: 3# 15 reps   Hip Extension: 3# 15 reps  Goals   End of Session Patient Active Problem List  Diagnoses  . CLOSED FRACTURE OF UPPER END OF TIBIA  . Meniscus, medial, derangement  . Pain in joint, lower leg  . Stiffness of joint, not elsewhere classified, lower leg  . Pathological dislocation   PT - End of Session Activity Tolerance: Patient tolerated treatment well General Behavior During Session: Colleen Lee for tasks performed Cognition: Colleen Lee for tasks performed PT Assessment and Plan Clinical Impression Statement: Began cybex quad and hamstrings, pt with weak eccentric control presented with cybex and descending stairs reciprocally.  Tactile cues with descending stairs to reduce hip rotation to increase knee flexion.  PT Plan: Re-eval prior to 03/18/2011 MD appt.  Continue progressing strength.  Colleen Lee 03/11/2011, 12:20 PM

## 2011-03-17 ENCOUNTER — Ambulatory Visit (HOSPITAL_COMMUNITY): Payer: Self-pay | Admitting: Physical Therapy

## 2011-03-18 ENCOUNTER — Ambulatory Visit (INDEPENDENT_AMBULATORY_CARE_PROVIDER_SITE_OTHER): Payer: Self-pay | Admitting: Orthopedic Surgery

## 2011-03-18 DIAGNOSIS — R52 Pain, unspecified: Secondary | ICD-10-CM

## 2011-03-18 MED ORDER — HYDROCODONE-ACETAMINOPHEN 7.5-325 MG PO TABS: 1.0000 | ORAL_TABLET | ORAL | Status: DC | PRN

## 2011-03-18 NOTE — Progress Notes (Signed)
This is a postoperative visit status post arthroscopy and partial medial meniscectomy after a treated tibial plateau fracture.  Complaint of throbbing LEFT knee  [facility]  Exam looks good she has some swelling in the popliteal fossa.  No medial or lateral joint line tenderness and no joint effusion Range of Motion 125.  Knee stable.  Flexion 125  Injection LEFT knee  Injection LEFT knee.  Consent was obtained.  Time out was taken   LEFT knee was injected with Depo-Medrol 40 mg plus lidocaine 1% 4 cc.  Knee was prepped with alcohol and anesthetized with ethyl chloride.  The injection was tolerated without complication.   Continued ibuprofen and hydrocodone 7.5 mg followup in 4 weeks

## 2011-03-19 ENCOUNTER — Inpatient Hospital Stay (HOSPITAL_COMMUNITY): Admission: RE | Admit: 2011-03-19 | Payer: Self-pay | Source: Ambulatory Visit | Admitting: *Deleted

## 2011-03-19 ENCOUNTER — Telehealth (HOSPITAL_COMMUNITY): Payer: Self-pay | Admitting: *Deleted

## 2011-03-23 ENCOUNTER — Ambulatory Visit (HOSPITAL_COMMUNITY)
Admission: RE | Admit: 2011-03-23 | Discharge: 2011-03-23 | Disposition: A | Discharge status: Home / Self Care | Payer: Self-pay | Source: Ambulatory Visit | Attending: Physical Therapy | Admitting: Physical Therapy

## 2011-03-23 NOTE — Progress Notes (Signed)
Physical Therapy Treatment Patient Details  Name: Colleen Lee MRN: [id number] Date of Birth: 1956-01-30  Today's Date: 03/23/2011 Time: 4098-1191 Time Calculation (min): 46 min Visit#: 6 of 8 (pt. Missed 2 appt due to car troubles) Re-eval: 03/25/11 Charges:  therex 39 min  Subjective: Symptoms/Limitations Symptoms: Pt. reports the shot didn't help much.  States she is hurting today. Pain Assessment Currently in Pain?: Yes Pain Score:   6 Pain Location: Knee Pain Orientation: Left    OBJECTIVE: Exercise/Treatments Stationary Bike: 6'@3 .0  Standing:  Heel Raises: 1 RT  Toe walk 1RT  Lateral Step Up: Step Height: 6";10 reps  Forward Step Up: Step Height: 6";10 reps  Forward step down: step height 6", 10 reps  Stairs: 2 RT, reciprocally 1 HR  (held today) Functional Squat: 15 reps  SLS with Vectors: 5x5"each  Sitting:  Cybex hamstring  2 PL 2X10 Cybex Quad 3 PL  2X10  Supine:  Straight Leg Raises: 15 reps 3#  Bridges: 15 reps  Side lying:  Hip ABduction: 3# 15 reps  Hip ADduction: 3# 15 reps  Prone:  Hamstring Curl: 3# 15 reps  Hip Extension: 3# 15 reps    Physical Therapy Assessment and Plan PT Assessment and Plan Clinical Impression Statement: Pt. with increased tenderness with exercises today due to shot at last MD appt.  Pt. declined IFES and ice today and will ice knee at home.  Pt. has met all STG's and progressing well with LTG's. PT Treatment/Interventions: Therapeutic exercise PT Plan: Re-evaluate next visit (pt missed last appt before MD appt.).  Add IFES/ice when needed.  Goals PT Short Term Goals PT Short Term Goal 1: Pt will be independent in HEP in order to maximize therapeutic effect. PT Short Term Goal 1 - Progress: Met PT Short Term Goal 2: Pt will ambulate independently x 500 ft. for household ambulation. PT Short Term Goal 2 - Progress: Met PT Short Term Goal 3: Pt will report pain less than 4/10 for 50% of her day while WB. PT Short Term Goal  3 - Progress: Met PT Short Term Goal 4: Pt will demonstrate L SLS static balance x 20 sec. PT Short Term Goal 4 - Progress: Met PT Long Term Goals PT Long Term Goal 1: Pt will demonstrate 5 sit to stands without UE support in 15 seconds for improved LE power. PT Long Term Goal 1 - Progress: Met PT Long Term Goal 2: Pt will improve knee extension and flexion strength to WNL for proper gait biomechanics. PT Long Term Goal 2 - Progress: Progressing toward goal Long Term Goal 3: Pt will report pain less than or equal to 3/10 for 75% of her day for improved quality of life. Long Term Goal 3 Progress: Not met Long Term Goal 4: Pt will tandem walk on dynamic surfaces x 30 ft for improve safety while walking outside in her yard. Long Term Goal 4 Progress: Progressing toward goal  Problem List Patient Active Problem List  Diagnoses  . CLOSED FRACTURE OF UPPER END OF TIBIA  . Meniscus, medial, derangement  . Pain in joint, lower leg  . Stiffness of joint, not elsewhere classified, lower leg  . Pathological dislocation    PT - End of Session Activity Tolerance: Patient tolerated treatment well General Behavior During Session: [facility] for tasks performed Cognition: [facility] for tasks performed  Emeline Gins B 03/23/2011, 11:57 AM

## 2011-03-25 ENCOUNTER — Ambulatory Visit (HOSPITAL_COMMUNITY)
Admission: RE | Admit: 2011-03-25 | Discharge: 2011-03-25 | Disposition: A | Discharge status: Home / Self Care | Payer: Self-pay | Source: Ambulatory Visit | Attending: Family Medicine | Admitting: Family Medicine

## 2011-03-25 DIAGNOSIS — M25669 Stiffness of unspecified knee, not elsewhere classified: Secondary | ICD-10-CM

## 2011-03-25 DIAGNOSIS — M25569 Pain in unspecified knee: Secondary | ICD-10-CM

## 2011-03-30 ENCOUNTER — Ambulatory Visit (HOSPITAL_COMMUNITY)
Admission: RE | Admit: 2011-03-30 | Discharge: 2011-03-30 | Disposition: A | Discharge status: Home / Self Care | Payer: Self-pay | Source: Ambulatory Visit | Attending: Family Medicine | Admitting: Family Medicine

## 2011-03-30 DIAGNOSIS — M25569 Pain in unspecified knee: Secondary | ICD-10-CM

## 2011-03-30 DIAGNOSIS — M25669 Stiffness of unspecified knee, not elsewhere classified: Secondary | ICD-10-CM

## 2011-03-30 NOTE — Progress Notes (Addendum)
Physical Therapy Treatment Patient Details  Name: Colleen Lee MRN: [id number] Date of Birth: Aug 01, 1956  Today's Date: 03/30/2011 Time: 7829-5621 Time Calculation (min): 53 min Charges: 8' gt, 8' [place], 37' TE Visit#: 9 Re-eval: 04/22/11  Subjective: Symptoms/Limitations Symptoms: pt reports that she had decreased pain after [place] treatment last visit.   Pain Assessment Pain Score:   5 Pain Location: Knee Pain Orientation: Left   Exercise/Treatments  TM Walking: x8 minutes @ 2.0 Seated:  Cybex knee extension 2PL 2x10 w/eccentric lowering  Cybex knee flexion: 3 PL 2x10 SUPINE:   ITB Stretch 3x30 Sec   Active HS stretch 3x30 sec  Standing:   Toe Raises: BLE 3x10  Heel Raises L on 2x10  Gastroc Stretch 3x30 sec   Soleus Stretch 3x30 sec  Prone:   Hang x5 min  Modalities Modalities: Ultrasound Manual Therapy Manual Therapy: Other (comment) Ultrasound Ultrasound Location: L distal ITB.  Completed by PT tech  Ultrasound Parameters: 1 mhz, 1.2 w/cm2 x8 min   Ultrasound Goals: Other (Comment) (adhesions)  Physical Therapy Assessment and Plan PT Assessment and Plan Clinical Impression Statement: Pt has difficulty with eccentric tibialis anterior control with ambulation. Pt had increased diaphreisis and needed increased rest breaks today secondary to not feeling well during treatment.  PT Plan: Cont to progress and continue with standing TKE and  supine SAQ's    Goals    Problem List Patient Active Problem List  Diagnoses  . CLOSED FRACTURE OF UPPER END OF TIBIA  . Meniscus, medial, derangement  . Pain in joint, lower leg  . Stiffness of joint, not elsewhere classified, lower leg  . Pathological dislocation       Estanislado Surgeon 03/30/2011, 11:53 AM

## 2011-03-30 NOTE — Progress Notes (Signed)
Physical Therapy Treatment/Progress Note  Patient Details  Name: Colleen Lee  MRN: [id number]  Date of Birth: [birth year]  Today's Date: 03/25/2011  Time:1110-1158  Charges: 1 ROM, 1 MMT, 8' manual, 8' [place], 30' TE  Time Calculation (min): 48 min  Visit#: 8  Re-eval: 04/22/11  Subjective:  Symptoms/Limitations  Symptoms: When I am taking my pills I am not in to much pain. Reports she has the most difficulty when the weather changes, and had pain after the injection. The injection is helping to control her pain.  How long can you sit comfortably?: 30 minutes  How long can you stand comfortably?: 60-90 minutes  How long can you walk comfortably?: 3-5 minutes, most of her pain is when going up the hill.  Pain Assessment  Currently in Pain?: No/denies  Exercise/Treatments  SUPINE:  ITB Stretch 3x30 Sec  Active HS stretch 3x30 sec  Standing:  Gastroc Stretch 3x30 sec  Soleus Stretch 3x30 sec  Tandem Walk 2 RT  Balance Beam 3 RT  TKE 2x10  Prone:  Hang x5 min  Physical Therapy Assessment and Plan  PT Assessment and Plan  Clinical Impression Statement: Pt continues to improve her strength and dynamic balance. Pt has continued ITB pain and decreased functional knee extension which is her major limiting factors.  Rehab Potential: Good  PT Frequency: Min 2X/week  PT Duration: 4 weeks  PT Treatment/Interventions: Therapeutic exercise;Other (comment) (manual and modalities for pain control)  PT Plan: Cont to address ITB pain and functional extension.  Goals  PT Short Term Goals  PT Short Term Goal 1: Pt will be independent in HEP in order to maximize therapeutic effect.  PT Short Term Goal 1 - Progress: Met  PT Short Term Goal 2: Pt will ambulate independently x 500 ft. for household ambulation.  PT Short Term Goal 2 - Progress: Met  PT Short Term Goal 3: Pt will report pain less than 4/10 for 50% of her day while WB.  PT Short Term Goal 3 - Progress: Met  PT Short Term Goal 4: Pt will  demonstrate L SLS static balance x 20 sec.  PT Short Term Goal 4 - Progress: Met  PT Long Term Goals  PT Long Term Goal 1: Pt will demonstrate 5 sit to stands without UE support in 15 seconds for improved LE power.  PT Long Term Goal 1 - Progress: Met  PT Long Term Goal 2: Pt will improve knee extension and flexion strength to WNL for proper gait biomechanics.  PT Long Term Goal 2 - Progress: Progressing toward goal  Long Term Goal 3: Pt will report pain less than or equal to 3/10 for 75% of her day for improved quality of life.  Long Term Goal 3 Progress: Not met  Long Term Goal 4: Pt will tandem walk on dynamic surfaces x 30 ft for improve safety while walking outside in her yard.  Long Term Goal 4 Progress: Met  Problem List  Patient Active Problem List   Diagnoses   .  CLOSED FRACTURE OF UPPER END OF TIBIA   .  Meniscus, medial, derangement   .  Pain in joint, lower leg   .  Stiffness of joint, not elsewhere classified, lower leg   .  Pathological dislocation    PT - End of Session  Activity Tolerance: Patient tolerated treatment well  Shazia Mitchener  03/25/2011, 1:11 PM

## 2011-04-01 ENCOUNTER — Inpatient Hospital Stay (HOSPITAL_COMMUNITY): Admission: RE | Admit: 2011-04-01 | Payer: Self-pay | Source: Ambulatory Visit

## 2011-04-01 ENCOUNTER — Telehealth (HOSPITAL_COMMUNITY): Payer: Self-pay

## 2011-04-06 ENCOUNTER — Inpatient Hospital Stay (HOSPITAL_COMMUNITY): Admission: RE | Admit: 2011-04-06 | Payer: Self-pay | Source: Ambulatory Visit

## 2011-04-08 ENCOUNTER — Ambulatory Visit (HOSPITAL_COMMUNITY)
Admission: RE | Admit: 2011-04-08 | Discharge: 2011-04-08 | Disposition: A | Discharge status: Home / Self Care | Payer: Self-pay | Source: Ambulatory Visit | Attending: Family Medicine | Admitting: Family Medicine

## 2011-04-08 DIAGNOSIS — M25669 Stiffness of unspecified knee, not elsewhere classified: Secondary | ICD-10-CM | POA: Insufficient documentation

## 2011-04-08 DIAGNOSIS — R262 Difficulty in walking, not elsewhere classified: Secondary | ICD-10-CM | POA: Insufficient documentation

## 2011-04-08 DIAGNOSIS — M25569 Pain in unspecified knee: Secondary | ICD-10-CM | POA: Insufficient documentation

## 2011-04-08 DIAGNOSIS — M6281 Muscle weakness (generalized): Secondary | ICD-10-CM | POA: Insufficient documentation

## 2011-04-08 DIAGNOSIS — IMO0001 Reserved for inherently not codable concepts without codable children: Secondary | ICD-10-CM | POA: Insufficient documentation

## 2011-04-08 NOTE — Progress Notes (Signed)
  Patient Details  Name: Colleen Lee MRN: [id number] Date of Birth: 1955/09/11  Today's Date: 04/08/2011 Pt came in for appointment a reported that she had a virus. Pt was told to cancel this appointment and return to therapy when she was free of virus for 24 hrs.  Seth Bake [facility] 04/08/2011, 12:18 PM

## 2011-04-12 ENCOUNTER — Other Ambulatory Visit: Payer: Self-pay | Admitting: Orthopedic Surgery

## 2011-04-13 ENCOUNTER — Ambulatory Visit (HOSPITAL_COMMUNITY)
Admission: RE | Admit: 2011-04-13 | Discharge: 2011-04-13 | Disposition: A | Discharge status: Home / Self Care | Payer: Self-pay | Source: Ambulatory Visit | Attending: Family Medicine | Admitting: Family Medicine

## 2011-04-13 ENCOUNTER — Other Ambulatory Visit: Payer: Self-pay | Admitting: *Deleted

## 2011-04-13 DIAGNOSIS — R52 Pain, unspecified: Secondary | ICD-10-CM

## 2011-04-13 DIAGNOSIS — M25569 Pain in unspecified knee: Secondary | ICD-10-CM

## 2011-04-13 DIAGNOSIS — M25669 Stiffness of unspecified knee, not elsewhere classified: Secondary | ICD-10-CM

## 2011-04-13 MED ORDER — HYDROCODONE-ACETAMINOPHEN 5-325 MG PO TABS: 1.0000 | ORAL_TABLET | ORAL | Status: DC | PRN

## 2011-04-13 NOTE — Progress Notes (Signed)
Physical Therapy Treatment Patient Details  Name: Colleen Lee MRN: [id number] Date of Birth: 1955-11-29  Today's Date: 04/13/2011 Time: 2595-6387 Time Calculation (min): 44 min Visit#: 10 Re-eval: 04/22/11 Charge: therex 38 Neuro Reed 6 min  Subjective: Symptoms/Limitations Symptoms: increased L knee pain at entrance 8/10 Pain Assessment Currently in Pain?: Yes Pain Score:   8 Pain Location: Knee Pain Orientation: Left  Objective:   Exercise/Treatments  SUPINE:  ITB Stretch 3x30 Sec  Active HS stretch 3x30 sec  TKE 10x 5"; 2 sets SAQ 10x 5" holds Standing:  Gastroc Stretch 3x30 sec  Soleus Stretch 3x30 sec  Tandem Walk 2 RT  Balance Beam 3 RT  TKE 2x10  Prone:  Hang x5 min  Physical Therapy Assessment and Plan PT Assessment and Plan Clinical Impression Statement: Therex focus on knee extension and balance.  Pt with increased difficulty with balance beam.  Min A to regain balance.  PT Plan: Continue progressing full knee extension and improving balance.    Goals    Problem List Patient Active Problem List  Diagnoses  . CLOSED FRACTURE OF UPPER END OF TIBIA  . Meniscus, medial, derangement  . Pain in joint, lower leg  . Stiffness of joint, not elsewhere classified, lower leg  . Pathological dislocation    General Behavior During Session: [facility] for tasks performed Cognition: [facility] for tasks performed  [name] 04/13/2011, 6:04 PM

## 2011-04-15 ENCOUNTER — Ambulatory Visit (INDEPENDENT_AMBULATORY_CARE_PROVIDER_SITE_OTHER): Payer: Self-pay | Admitting: Orthopedic Surgery

## 2011-04-15 ENCOUNTER — Encounter: Payer: Self-pay | Admitting: Orthopedic Surgery

## 2011-04-15 ENCOUNTER — Ambulatory Visit (HOSPITAL_COMMUNITY)
Admission: RE | Admit: 2011-04-15 | Discharge: 2011-04-15 | Disposition: A | Discharge status: Home / Self Care | Payer: Self-pay | Source: Ambulatory Visit | Attending: *Deleted | Admitting: *Deleted

## 2011-04-15 VITALS — Ht 66.0 in | Wt 152.0 lb

## 2011-04-15 DIAGNOSIS — Z9889 Other specified postprocedural states: Secondary | ICD-10-CM

## 2011-04-15 DIAGNOSIS — M25669 Stiffness of unspecified knee, not elsewhere classified: Secondary | ICD-10-CM

## 2011-04-15 DIAGNOSIS — M25569 Pain in unspecified knee: Secondary | ICD-10-CM

## 2011-04-15 MED ORDER — IBUPROFEN 800 MG PO TABS: 800.0000 mg | ORAL_TABLET | Freq: Three times a day (TID) | ORAL | Status: DC | PRN

## 2011-04-15 NOTE — Patient Instructions (Addendum)
You have received a steroid shot. 15% of patients experience increased pain at the injection site with in the next 24 hours. This is best treated with ice and tylenol extra strength 2 tabs every 8 hours. If you are still having pain please call the office.    Attention: You have  indicated that you are still smoking.  If you are interested in quitting please see your primary care physician regarding measures you can take to improve your health and stop smoking.

## 2011-04-15 NOTE — Progress Notes (Signed)
Status post arthroscopy, LEFT knee for medial meniscal tear.  Status post tibial plateau fracture, treated with brace.  Undergoing physical therapy at the hospital complains of lateral pain and iliotibial band symptoms.    Injection LEFT knee.  Consent was obtained.  Time out was taken   LEFT knee Iliotibial band was injected with Depo-Medrol 40 mg plus lidocaine 1% 4 cc.  Knee was prepped with alcohol and anesthetized with ethyl chloride.  The injection was tolerated without complication.

## 2011-04-15 NOTE — Patient Instructions (Addendum)
Gait train to keep stride length equal.

## 2011-04-15 NOTE — Progress Notes (Signed)
Physical Therapy Treatment Patient Details  Name: Colleen Lee MRN: [id number] Date of Birth: [birth year]  Today's Date: 04/15/2011 Time: 8295-6213 Time Calculation (min): 39 min Visit#:11 of 12 Re-eval: 04/22/11  Subjective: Symptoms/Limitations Symptoms: Pt states that she just came from MD who want her to have therapy prior to the injection that he gave her wore off.   Pain Assessment Pain Score:   3 Pain Location: Knee Pain Orientation: Left;Lateral Pain Frequency: Constant  Precautions/Restrictions     Mobility (including Balance)       Exercise/Treatments For strengthening and stretching per doc flowsheet     Physical Therapy Assessment and Plan PT Assessment and Plan Clinical Impression Statement: Pt abductors have normal strength.  D/C hip abduction.  Pt completes exercises with good technique.  added prone ex back int regieme. Rehab Potential: Good    Goals    Problem List Patient Active Problem List  Diagnoses  . CLOSED FRACTURE OF UPPER END OF TIBIA  . Meniscus, medial, derangement  . Pain in joint, lower leg  . Stiffness of joint, not elsewhere classified, lower leg  . Pathological dislocation    PT - End of Session Activity Tolerance: Patient tolerated treatment well General Behavior During Session: [name] for tasks performed Cognition: [name] for tasks performed  Colleen Lee,[name] 04/15/2011, 12:39 PM

## 2011-04-20 ENCOUNTER — Ambulatory Visit (HOSPITAL_COMMUNITY)
Admission: RE | Admit: 2011-04-20 | Discharge: 2011-04-20 | Disposition: A | Discharge status: Home / Self Care | Payer: Self-pay | Source: Ambulatory Visit | Attending: Family Medicine | Admitting: Family Medicine

## 2011-04-20 DIAGNOSIS — M25569 Pain in unspecified knee: Secondary | ICD-10-CM

## 2011-04-20 DIAGNOSIS — M25669 Stiffness of unspecified knee, not elsewhere classified: Secondary | ICD-10-CM

## 2011-04-20 NOTE — Progress Notes (Signed)
Physical Therapy Treatment Patient Details  Name: Colleen Lee MRN: [id number] Date of Birth: 1956/02/07  Today's Date: 04/20/2011 Time: 1102-1200 Time Calculation (min): 58 min Visit#: 12  of 12   Re-eval: 04/22/11  Charge: therex 52 min   Subjective: Symptoms/Limitations Symptoms: 7/10 knee and broken toe.  Pt stated the pain relief following injection lasted for 2 days then pain right back. Pain Assessment Currently in Pain?: Yes Pain Score:   7 Pain Location: Knee Pain Orientation: Left  Objective:  Exercise/Treatments  Bike 6' @ 2.5 Heel raises 2x 10 Toe raises 2x 10 Squats 2x 10 SLS 20" max of 5 Stairwell 2 RT forward step up/step down Stairwell 1RT lateral step up Cybex quad 1.5 PL 2x 10 Cybex hs 2.5 PL 2x 10 TKE with Blue tband 15x 5" Prone:  SLR 2x 10 HS curl 2x 10 3# S/L Abd 2x 10 3# Supine: SAQ 2x 10 3#  Heel slide 10 x PROM 3x 30"   Physical Therapy Assessment and Plan PT Assessment and Plan Clinical Impression Statement: Pt tolerated well towards total tx.  Pt completed all exercises with good form, tech.  Computers off line for treatment, re-eval due next tx. PT Plan: Continue with ROM and balance, reeval next treatment.    Goals    Problem List Patient Active Problem List  Diagnoses  . CLOSED FRACTURE OF UPPER END OF TIBIA  . Meniscus, medial, derangement  . Pain in joint, lower leg  . Stiffness of joint, not elsewhere classified, lower leg  . Pathological dislocation    PT - End of Session Activity Tolerance: Patient tolerated treatment well General Behavior During Session: [facility] for tasks performed Cognition: [facility] for tasks performed  [name] 04/20/2011, 1:22 PM

## 2011-04-22 ENCOUNTER — Ambulatory Visit (HOSPITAL_COMMUNITY): Payer: Self-pay | Admitting: Physical Therapy

## 2011-04-26 ENCOUNTER — Other Ambulatory Visit: Payer: Self-pay | Admitting: Orthopedic Surgery

## 2011-04-26 DIAGNOSIS — R52 Pain, unspecified: Secondary | ICD-10-CM

## 2011-04-26 MED ORDER — HYDROCODONE-ACETAMINOPHEN 5-325 MG PO TABS: 1.0000 | ORAL_TABLET | ORAL | Status: DC | PRN

## 2011-04-27 ENCOUNTER — Inpatient Hospital Stay (HOSPITAL_COMMUNITY): Admission: RE | Admit: 2011-04-27 | Payer: Self-pay | Source: Ambulatory Visit | Admitting: Physical Therapy

## 2011-04-27 ENCOUNTER — Telehealth (HOSPITAL_COMMUNITY): Payer: Self-pay

## 2011-04-29 ENCOUNTER — Ambulatory Visit (HOSPITAL_COMMUNITY)
Admission: RE | Admit: 2011-04-29 | Discharge: 2011-04-29 | Disposition: A | Discharge status: Home / Self Care | Payer: Self-pay | Source: Ambulatory Visit | Attending: Family Medicine | Admitting: Family Medicine

## 2011-04-29 DIAGNOSIS — M25669 Stiffness of unspecified knee, not elsewhere classified: Secondary | ICD-10-CM

## 2011-04-29 DIAGNOSIS — M25569 Pain in unspecified knee: Secondary | ICD-10-CM

## 2011-04-29 LAB — HEPATITIS B SURFACE ANTIGEN: Hepatitis B Surface Ag: NEGATIVE

## 2011-04-29 LAB — HEPATITIS C ANTIBODY: HCV Ab: REACTIVE — AB

## 2011-04-29 NOTE — Progress Notes (Signed)
Physical Therapy Discharge Summary Patient Details  Name: Colleen Lee MRN: [id number] Date of Birth: 05-25-1956  Today's Date: 04/29/2011 Time: 9147-8295 Time Calculation (min): 42 min Charges: 19' TE Visit#: 13  of    Re-eval:      Subjective:  Pt reports that overall she still has a great amount of pain.  She reports she is only about to stand 15-20 minutes, walk 5-10 minutes and able to go up and down stairs.  She reports adherence to HEP.   Exercise/Treatments 6' 3.0 Bike for warm up  Today Treatment with focus on HEP:  Functional Squats 2x15 Toe Raises 2x15 HS Curls in standing x15 TKE w/blue band 15x 10" hold Active HS stretch 3x30" ITB stretch 3x30" MMT for LLE: Knee flexion 5/5, Knee Extension 5/5 AROM for L knee: 0-120  Physical Therapy Assessment and Plan PT Assessment and Plan Clinical Impression Statement: [name] was initally referred to PT s/p tibial plateau fracture and has attended 21 visits since 01/25/11.  She has met 3 of 4 STG and 2 of LTG.  She continues to have the greatest limitation of pain to lateral  anterior joint line which has not resolved with any conervative treatment.  Pt has improved in her functional strength, ROM and balance.  However function continues to be limited by pain.  She will continue to benefit from an HEP to address strength, flexibility  and balance at home.  PT Plan: D/C with advanced HEP    Goals PT Short Term Goals PT Short Term Goal 1: Pt will be independent in HEP in order to maximize therapeutic effect. PT Short Term Goal 1 - Progress: Met PT Short Term Goal 2: Pt will ambulate independently x 500 ft. for household ambulation. PT Short Term Goal 2 - Progress: Met PT Short Term Goal 3: Pt will report pain less than 4/10 for 50% of her day while WB. PT Short Term Goal 3 - Progress: Not met PT Short Term Goal 4: Pt will demonstrate L SLS static balance x 20 sec. PT Short Term Goal 4 - Progress: Met PT Short Term Goal 5: Pt  will display decreased adhesions to L distal ITB x2 weeks PT Long Term Goals PT Long Term Goal 1: Pt will demonstrate 5 sit to stands without UE support in 15 seconds for improved LE power. PT Long Term Goal 1 - Progress: Met PT Long Term Goal 2: Pt will improve knee extension and flexion strength to WNL for proper gait biomechanics. PT Long Term Goal 2 - Progress: Partly met Long Term Goal 3: Pt will report pain less than or equal to 3/10 for 75% of her day for improved quality of life. Long Term Goal 3 Progress: Not met Long Term Goal 4: Pt will tandem walk on dynamic surfaces x 30 ft for improve safety while walking outside in her yard. Long Term Goal 4 Progress: Met  Problem List Patient Active Problem List  Diagnoses  . CLOSED FRACTURE OF UPPER END OF TIBIA  . Meniscus, medial, derangement  . Pain in joint, lower leg  . Stiffness of joint, not elsewhere classified, lower leg  . Pathological dislocation    PT - End of Session Activity Tolerance: Patient tolerated treatment well  Valin  04/29/2011, 11:45 AM

## 2011-05-18 ENCOUNTER — Encounter: Payer: Self-pay | Admitting: Orthopedic Surgery

## 2011-05-18 ENCOUNTER — Ambulatory Visit (INDEPENDENT_AMBULATORY_CARE_PROVIDER_SITE_OTHER): Payer: Self-pay | Admitting: Orthopedic Surgery

## 2011-05-18 DIAGNOSIS — S82143A Displaced bicondylar fracture of unspecified tibia, initial encounter for closed fracture: Secondary | ICD-10-CM

## 2011-05-18 DIAGNOSIS — S82109A Unspecified fracture of upper end of unspecified tibia, initial encounter for closed fracture: Secondary | ICD-10-CM

## 2011-05-18 DIAGNOSIS — G5790 Unspecified mononeuropathy of unspecified lower limb: Secondary | ICD-10-CM

## 2011-05-18 MED ORDER — PREDNISONE 10 MG PO TABS: 10.0000 mg | ORAL_TABLET | Freq: Two times a day (BID) | ORAL | Status: AC

## 2011-05-18 MED ORDER — HYDROCODONE-ACETAMINOPHEN 7.5-325 MG PO TABS: 1.0000 | ORAL_TABLET | ORAL | Status: AC | PRN

## 2011-05-18 NOTE — Patient Instructions (Signed)
You have received a steroid shot. 15% of patients experience increased pain at the injection site with in the next 24 hours. This is best treated with ice and tylenol extra strength 2 tabs every 8 hours. If you are still having pain please call the office.    

## 2011-05-18 NOTE — Progress Notes (Signed)
Status post arthroscopy, LEFT knee for medial meniscal tear.  Status post tibial plateau fracture, treated with brace.  Undergoing physical therapy at the hospital complains of lateral pain and iliotibial band symptoms.  Continued lateral iliotibial band, tenderness and pain.  Her strength is improved. Her straight leg raise is now normal. The joint lines and tender.  Recommend repeat injection. Continue pain medications, start prednisone, as she is starting to have some radicular symptoms in the LEFT leg.  Followup 2 months   Injection LEFT knee.  Consent was obtained.  Time out was taken  LEFT knee Iliotibial band was injected with Depo-Medrol 40 mg plus lidocaine 1% 4 cc.  Knee was prepped with alcohol and anesthetized with ethyl chloride.  The injection was tolerated without complication.

## 2011-07-20 ENCOUNTER — Encounter: Payer: Self-pay | Admitting: Orthopedic Surgery

## 2011-07-20 ENCOUNTER — Ambulatory Visit: Payer: Self-pay | Admitting: Orthopedic Surgery

## 2011-07-21 ENCOUNTER — Other Ambulatory Visit: Payer: Self-pay | Admitting: Orthopedic Surgery

## 2011-07-21 DIAGNOSIS — R52 Pain, unspecified: Secondary | ICD-10-CM

## 2011-07-21 MED ORDER — HYDROCODONE-ACETAMINOPHEN 7.5-325 MG PO TABS: 1.0000 | ORAL_TABLET | ORAL | Status: DC | PRN

## 2011-07-21 NOTE — Telephone Encounter (Signed)
Colleen Lee called about her refill request for pain medicine, said [facility] has not heard from our office

## 2011-08-10 ENCOUNTER — Ambulatory Visit (INDEPENDENT_AMBULATORY_CARE_PROVIDER_SITE_OTHER): Payer: Self-pay | Admitting: Orthopedic Surgery

## 2011-08-10 ENCOUNTER — Encounter: Payer: Self-pay | Admitting: Orthopedic Surgery

## 2011-08-10 VITALS — BP 90/58 | Ht 66.0 in | Wt 152.0 lb

## 2011-08-10 DIAGNOSIS — M5430 Sciatica, unspecified side: Secondary | ICD-10-CM

## 2011-08-10 DIAGNOSIS — Z9889 Other specified postprocedural states: Secondary | ICD-10-CM | POA: Insufficient documentation

## 2011-08-10 DIAGNOSIS — S82143A Displaced bicondylar fracture of unspecified tibia, initial encounter for closed fracture: Secondary | ICD-10-CM

## 2011-08-10 DIAGNOSIS — S82109A Unspecified fracture of upper end of unspecified tibia, initial encounter for closed fracture: Secondary | ICD-10-CM

## 2011-08-10 MED ORDER — PREDNISONE 10 MG PO KIT: 10.0000 mg | PACK | ORAL | Status: DC

## 2011-08-10 MED ORDER — IBUPROFEN 800 MG PO TABS: 800.0000 mg | ORAL_TABLET | Freq: Three times a day (TID) | ORAL | Status: DC | PRN

## 2011-08-10 NOTE — Patient Instructions (Signed)
Continue Ibuprofen and prednisone

## 2011-08-10 NOTE — Progress Notes (Signed)
Patient ID: Colleen Lee, female   DOB: 08/02/1956, 56 y.o.   MRN: [id number] Chief Complaint  Patient presents with  . Follow-up    1 month recheck on left knee.    The patient has had a LEFT tibial plateau fracture treated nonoperatively with bracing followed by LEFT knee arthroscopy for torn meniscus she now complains of LEFT lower extremity radicular symptoms which seem to bother her but are relieved by ibuprofen and prednisone with hydrocodone  Review of systems no red flags at this time  Social history :Her husband recently diagnosed with stage IV cancer is requiring much of her attention at this time and she is not able to get an MRI of her back to see if there is a disc we can inject  Recommend continue current medications, when she can come in or call we can get an MRI set up of her back

## 2011-08-26 ENCOUNTER — Telehealth: Payer: Self-pay | Admitting: Orthopedic Surgery

## 2011-08-26 ENCOUNTER — Other Ambulatory Visit: Payer: Self-pay | Admitting: Orthopedic Surgery

## 2011-08-26 DIAGNOSIS — M25569 Pain in unspecified knee: Secondary | ICD-10-CM

## 2011-08-26 NOTE — Telephone Encounter (Signed)
Ok I m not calling her for this   They faxed [place] the request ??? Use the script for 70 instead

## 2011-08-26 NOTE — Telephone Encounter (Signed)
Received call from [facility], ph [phone]. She states she received fax for the Norco 5/325; states patient had been taking Norco 7.5.  She also states that patient also received 70 of the Norco 7.5 on 08/20/11 from this office.  Please call at above phone #.

## 2011-08-27 NOTE — Telephone Encounter (Signed)
Nurse has taken care of call back, today, 08/27/11.

## 2011-08-30 ENCOUNTER — Other Ambulatory Visit: Payer: Self-pay | Admitting: *Deleted

## 2011-08-30 DIAGNOSIS — R52 Pain, unspecified: Secondary | ICD-10-CM

## 2011-08-30 MED ORDER — HYDROCODONE-ACETAMINOPHEN 7.5-325 MG PO TABS: 1.0000 | ORAL_TABLET | ORAL | Status: DC | PRN

## 2011-09-27 ENCOUNTER — Other Ambulatory Visit: Payer: Self-pay

## 2011-09-27 ENCOUNTER — Emergency Department (HOSPITAL_COMMUNITY)
Admission: EM | Admit: 2011-09-27 | Discharge: 2011-09-28 | Disposition: A | Discharge status: Home / Self Care | Payer: Self-pay | Attending: Emergency Medicine | Admitting: Emergency Medicine

## 2011-09-27 ENCOUNTER — Encounter (HOSPITAL_COMMUNITY): Payer: Self-pay | Admitting: *Deleted

## 2011-09-27 ENCOUNTER — Emergency Department (HOSPITAL_COMMUNITY): Payer: Self-pay

## 2011-09-27 DIAGNOSIS — R079 Chest pain, unspecified: Secondary | ICD-10-CM | POA: Insufficient documentation

## 2011-09-27 DIAGNOSIS — F17200 Nicotine dependence, unspecified, uncomplicated: Secondary | ICD-10-CM | POA: Insufficient documentation

## 2011-09-27 DIAGNOSIS — R112 Nausea with vomiting, unspecified: Secondary | ICD-10-CM | POA: Insufficient documentation

## 2011-09-27 DIAGNOSIS — K2920 Alcoholic gastritis without bleeding: Secondary | ICD-10-CM | POA: Insufficient documentation

## 2011-09-27 DIAGNOSIS — I10 Essential (primary) hypertension: Secondary | ICD-10-CM | POA: Insufficient documentation

## 2011-09-27 HISTORY — DX: Essential (primary) hypertension: I10

## 2011-09-27 LAB — DIFFERENTIAL
Basophils Absolute: 0 10*3/uL (ref 0.0–0.1)
Basophils Relative: 0 % (ref 0–1)
Eosinophils Absolute: 0.1 10*3/uL (ref 0.0–0.7)
Eosinophils Relative: 1 % (ref 0–5)
Lymphocytes Relative: 25 % (ref 12–46)
Lymphs Abs: 1.6 10*3/uL (ref 0.7–4.0)
Monocytes Absolute: 0.5 10*3/uL (ref 0.1–1.0)
Monocytes Relative: 8 % (ref 3–12)
Neutro Abs: 4.2 10*3/uL (ref 1.7–7.7)
Neutrophils Relative %: 66 % (ref 43–77)

## 2011-09-27 LAB — COMPREHENSIVE METABOLIC PANEL
ALT: 44 U/L — ABNORMAL HIGH (ref 0–35)
AST: 35 U/L (ref 0–37)
Albumin: 3.7 g/dL (ref 3.5–5.2)
Alkaline Phosphatase: 53 U/L (ref 39–117)
BUN: 6 mg/dL (ref 6–23)
CO2: 27 mEq/L (ref 19–32)
Calcium: 10.5 mg/dL (ref 8.4–10.5)
Chloride: 103 mEq/L (ref 96–112)
Creatinine, Ser: 0.64 mg/dL (ref 0.50–1.10)
GFR calc Af Amer: >90 mL/min (ref >90)
GFR calc non Af Amer: >90 mL/min (ref >90)
Glucose, Bld: 119 mg/dL — ABNORMAL HIGH (ref 70–99)
Potassium: 3 mEq/L — ABNORMAL LOW (ref 3.5–5.1)
Sodium: 137 mEq/L (ref 135–145)
Total Bilirubin: 0.8 mg/dL (ref 0.3–1.2)
Total Protein: 7.5 g/dL (ref 6.0–8.3)

## 2011-09-27 LAB — CBC
HCT: 37.2 % (ref 36.0–46.0)
Hemoglobin: 13.1 g/dL (ref 12.0–15.0)
MCH: 31.9 pg (ref 26.0–34.0)
MCHC: 35.2 g/dL (ref 30.0–36.0)
MCV: 90.5 fL (ref 78.0–100.0)
Platelets: 212 10*3/uL (ref 150–400)
RBC: 4.11 MIL/uL (ref 3.87–5.11)
RDW: 12.8 % (ref 11.5–15.5)
WBC: 6.4 10*3/uL (ref 4.0–10.5)

## 2011-09-27 LAB — LIPASE, BLOOD: Lipase: 20 U/L (ref 11–59)

## 2011-09-27 LAB — TROPONIN I: Troponin I: <0.3 ng/mL (ref <0.30)

## 2011-09-27 MED ORDER — FAMOTIDINE 20 MG PO TABS: 20.0000 mg | ORAL_TABLET | Freq: Two times a day (BID) | ORAL | Status: DC

## 2011-09-27 MED ORDER — HYDROCODONE-ACETAMINOPHEN 5-325 MG PO TABS: 2.0000 | ORAL_TABLET | ORAL | Status: DC | PRN

## 2011-09-27 MED ORDER — GI COCKTAIL ~~LOC~~
30.0000 mL | Freq: Once | ORAL | Status: AC
  Administered 2011-09-27: 30 mL via ORAL
  Filled 2011-09-27: qty 30

## 2011-09-27 MED ORDER — ONDANSETRON 8 MG PO TBDP
8.0000 mg | ORAL_TABLET | Freq: Once | ORAL | Status: AC
  Administered 2011-09-27: 8 mg via ORAL
  Filled 2011-09-27: qty 1

## 2011-09-27 MED ORDER — ONDANSETRON 8 MG PO TBDP: 8.0000 mg | ORAL_TABLET | Freq: Three times a day (TID) | ORAL | Status: AC | PRN

## 2011-09-27 MED ORDER — PANTOPRAZOLE SODIUM 40 MG PO TBEC: 40.0000 mg | DELAYED_RELEASE_TABLET | Freq: Every day | ORAL | Status: DC

## 2011-09-27 MED ORDER — POTASSIUM CHLORIDE CRYS ER 20 MEQ PO TBCR
40.0000 meq | EXTENDED_RELEASE_TABLET | Freq: Once | ORAL | Status: AC
  Administered 2011-09-28: 40 meq via ORAL
  Filled 2011-09-27: qty 2

## 2011-09-27 MED ORDER — FAMOTIDINE 20 MG PO TABS
20.0000 mg | ORAL_TABLET | Freq: Once | ORAL | Status: AC
  Administered 2011-09-27: 20 mg via ORAL
  Filled 2011-09-27: qty 1

## 2011-09-27 MED ORDER — HYDROMORPHONE HCL PF 2 MG/ML IJ SOLN
1.0000 mg | Freq: Once | INTRAMUSCULAR | Status: AC
  Administered 2011-09-27: 1 mg via INTRAMUSCULAR
  Filled 2011-09-27: qty 1

## 2011-09-27 MED ORDER — PANTOPRAZOLE SODIUM 40 MG PO TBEC
40.0000 mg | DELAYED_RELEASE_TABLET | Freq: Once | ORAL | Status: AC
  Administered 2011-09-27: 40 mg via ORAL
  Filled 2011-09-27: qty 1

## 2011-09-27 NOTE — Discharge Instructions (Signed)
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 This is an inflammation of the lining of the stomach. It is caused by drinking alcohol. The symptoms may include: burning abdominal pain, nausea, vomiting or even vomiting blood. It can be made worse by a poor diet. People who drink frequently often do not eat well. Taking aspirin or other anti-inflammatory medications increases stomach irritation and bleeding. These medicines should be avoided. °Treatment is aimed at the cause. You have to stop drinking alcohol if you want to get better. Eat a healthy, well-balanced diet. You may take liquid antacids as needed. Your caregiver may prescribe medications to help heal the stomach lining. Take these as prescribed. °PREVENTION  °· Anyone who has experienced alcoholic gastritis should consider that alcoholism may be an issue. Professional evaluation is highly recommended.  °· Although some people can recover without help, most need assistance. With treatment and support, many are able to stop drinking and rebuild their lives. Long-term recovery is possible.  °· Alcohol Addiction cannot be cured, but it can be treated successfully. Treatment centers are listed in telephone listings under:  °· Alcoholism and Addiction Treatment; Substance Abuse Treatment or Cocaine, Narcotics and Alcoholics Anonymous. Most hospitals and clinics can refer you to a specialized care center.  °· The [place] government maintains a toll-free number for treatment referrals: 1-800-662-4357 or 1-800-487-4889 (TDD). They also maintain a website: http://findtreatment.samhsa.gov. Other websites for more information are: www.mentalhealth.samhsa.gov and www.nida.gov.  °· In [place], treatment resources are listed in each province. Listings are available under:  °· The [employer] or similar titles.  °SEEK IMMEDIATE MEDICAL CARE IF:  °· You develop severe abdominal pain, uncontrolled vomiting, or vomiting blood.  °· You blackout or have  fainting spells.  °· You develop seizures (this could be life threatening).  °· You develop bloody stools or stools that appear black or tarry.  °Document Released: 08/26/2004 Document Revised: 03/31/2011 Document Reviewed: 07/23/2009 °ExitCare® Patient Information ©2012 [employer]. °

## 2011-09-27 NOTE — ED Notes (Signed)
C/o chest pain for awhile for a month or two, abd pain with N/V x 1-2 months as well, instructed by clinic to come here for evaluation

## 2011-09-27 NOTE — ED Notes (Signed)
Vomiting for 2 weeks; no reason as to why came to ER tonight except concerned that it may be an ulcer

## 2011-09-27 NOTE — ED Provider Notes (Signed)
History  This chart was scribed for Colleen Bonier, MD by Bennett Scrape. This patient was seen in room APA10/APA10 and the patient's care was started at 9:10PM.  CSN: [id number]  Arrival date & time 09/27/11  1545   First MD Initiated Contact with Patient 09/27/11 2019      Chief Complaint  Patient presents with  . Abdominal Pain  . Nausea  . Emesis  . Chest Pain    The history is provided by the patient. No language interpreter was used.    Colleen Lee is a 56 y.o. female who presents to the Emergency Department complaining of one to two months of gradual onset, gradually worsening, constant chest pain. The pt describes the pain as a non-radiating sharp and throbbing sternal pain. She also c/o vomiting after eating. She reports that the symptoms started shortly after she found out that her husband has cancer, so she orginally thought that the symptoms were from stress. She has become concerned because she states that she is now waking up in the middle of the night to vomit. She denies diarrhea, SOB, and cough as associated symptoms. She has a h/o asthma and HTN, but she denies having a h/o heart problems. She reports that she drinks beer everyday and has begun drinking more than usual after finding out that her husband was diagnosed with cancer. She is a current everyday smoker.   Past Medical History  Diagnosis Date  . Hypertension     No past surgical history on file.  Family History  Problem Relation Age of Onset  . Cancer      FH  . Asthma      FH    History  Substance Use Topics  . Smoking status: Current Everyday Smoker -- 0.5 packs/day    Types: Cigarettes  . Smokeless tobacco: Not on file  . Alcohol Use: No    Review of Systems  Constitutional: Negative for fever and chills.  HENT: Negative for congestion, sore throat and neck pain.   Eyes: Negative for pain.  Respiratory: Negative for cough and shortness of breath.   Cardiovascular: Positive for  chest pain.  Gastrointestinal: Positive for nausea and vomiting. Negative for abdominal pain, diarrhea and constipation.  Genitourinary: Negative for dysuria, frequency and hematuria.  Musculoskeletal: Negative for back pain.  Skin: Negative for rash.  Neurological: Negative for numbness and headaches.    Allergies  Penicillins  Home Medications   Current Outpatient Rx  Name Route Sig Dispense Refill  . HYDROCODONE-ACETAMINOPHEN 5-325 MG PO TABS Oral Take 1 tablet by mouth every 6 (six) hours as needed. For pain    . IBUPROFEN 800 MG PO TABS Oral Take 1 tablet (800 mg total) by mouth every 8 (eight) hours as needed. 90 tablet 5  . LISINOPRIL-HYDROCHLOROTHIAZIDE 20-25 MG PO TABS Oral Take 1 tablet by mouth daily.      Marland Kitchen PROMETHAZINE HCL 25 MG PO TABS Oral Take 25 mg by mouth every 4 (four) hours as needed. One q4 prn nausea       Triage Vitals: BP 134/80  Pulse 64  Temp(Src) 97.8 F (36.6 C) (Oral)  Resp 20  Ht 5\' 6"  (1.676 m)  Wt 126 lb (57.153 kg)  BMI 20.34 kg/m2  SpO2 98%  Physical Exam  Nursing note and vitals reviewed. Constitutional: She is oriented to person, place, and time. She appears well-developed and well-nourished.  HENT:  Head: Normocephalic and atraumatic.  Eyes: Pupils are equal, round, and reactive  to light. No scleral icterus.  Neck: Normal range of motion. Neck supple. No JVD present.  Cardiovascular: Normal rate, regular rhythm and normal heart sounds.  Exam reveals no gallop and no friction rub.   No murmur heard. Pulmonary/Chest: Effort normal and breath sounds normal. No respiratory distress. She has no wheezes. She has no rales. She exhibits tenderness (Tender of the sternum reproducing and augmenting the pain).       Clear lung sounds, No rhonchi  Abdominal: Soft. Bowel sounds are normal. There is no tenderness. There is no rebound and no guarding.  Musculoskeletal: Normal range of motion. She exhibits no edema.  Neurological: She is alert and  oriented to person, place, and time. No cranial nerve deficit.  Skin: Skin is warm and dry.  Psychiatric: She has a normal mood and affect. Her behavior is normal.    ED Course  Procedures (including critical care time)   Date: 09/27/2011  Rate: 59  Rhythm: sinus bradycardia  QRS Axis: normal  Intervals: normal  ST/T Wave abnormalities: nonspecific T wave changes  Conduction Disutrbances:none  Narrative Interpretation: Left ventricular hypertrophy with non-specific repolarization abnormality  Old EKG Reviewed: unchanged   DIAGNOSTIC STUDIES: Oxygen Saturation is 98% on room air, normal by my interpretation.    COORDINATION OF CARE: 9:15PM-Discussed blood panel and x-ray orders with pt and pt agreed to plan.   Labs Reviewed  COMPREHENSIVE METABOLIC PANEL - Abnormal; Notable for the following:    Potassium 3.0 (*)    Glucose, Bld 119 (*)    ALT 44 (*)    All other components within normal limits  CBC  DIFFERENTIAL  LIPASE, BLOOD  TROPONIN I  URINALYSIS, ROUTINE W REFLEX MICROSCOPIC   Dg Chest 2 View  09/27/2011  *RADIOLOGY REPORT*  Clinical Data: Chest pain for 2 months.  CHEST - 2 VIEW  Comparison: 07/14/2005  Findings: Two views of the chest were obtained.  Lungs are clear without focal airspace disease.  There is a nodular density in the right lower chest that is most likely related to a nipple shadow. Heart and mediastinum are within normal limits and the trachea is midline.  Bony structures are intact.  IMPRESSION: No acute cardiopulmonary disease.  Nodular density in the right lower hemithorax is most likely related to a nipple shadow and could be confirmed with nipple markers.  Original Report Authenticated By: Richarda Overlie, M.D.     No diagnosis found.    MDM  Costocondritis, chest wall pain, pneumonia, bronchitis, gastrointestinal chest pain, pancreatis, gastritis and alcohol gastris are primarily entertained in the differential diagnosis. Cardiac chest pain is  thought to be less likely, but I will evaluate the cardiac enzymes.   11:40 PM The patient has had resolution of her chest/epigastric pain after treatment with antacids and GI cocktail. She is eating and drinking without any discomfort. She has no chest pain. Her cardiac enzymes and EKG are negative for ischemia or injury at over one month of constant atypical symptoms. 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 She reports that she is a daily drinker. I counseled her on discontinuing her alcohol use to allow her stomach to heal. She states her understanding of and agreement with the plan of care. At this time the patient is stable for discharge home.    I personally performed the services described in this documentation, which was scribed in my presence. The recorded information has been reviewed  and considered.    Colleen Bonier, MD 09/27/11 [phone]

## 2011-09-28 LAB — URINALYSIS, ROUTINE W REFLEX MICROSCOPIC
Glucose, UA: NEGATIVE mg/dL
Hgb urine dipstick: NEGATIVE
Ketones, ur: 15 mg/dL — AB
Leukocytes, UA: NEGATIVE
Nitrite: NEGATIVE
Protein, ur: NEGATIVE mg/dL
Specific Gravity, Urine: 1.01 (ref 1.005–1.030)
Urobilinogen, UA: 0.2 mg/dL (ref 0.0–1.0)
pH: 7 (ref 5.0–8.0)

## 2011-10-04 ENCOUNTER — Other Ambulatory Visit: Payer: Self-pay | Admitting: *Deleted

## 2011-10-04 ENCOUNTER — Other Ambulatory Visit: Payer: Self-pay | Admitting: Orthopedic Surgery

## 2011-10-04 DIAGNOSIS — R52 Pain, unspecified: Secondary | ICD-10-CM

## 2011-10-04 MED ORDER — HYDROCODONE-ACETAMINOPHEN 7.5-325 MG PO TABS: 1.0000 | ORAL_TABLET | ORAL | Status: AC | PRN

## 2011-11-03 ENCOUNTER — Other Ambulatory Visit: Payer: Self-pay | Admitting: Orthopedic Surgery

## 2011-11-03 DIAGNOSIS — M25569 Pain in unspecified knee: Secondary | ICD-10-CM

## 2011-11-09 ENCOUNTER — Ambulatory Visit: Payer: Self-pay | Admitting: Orthopedic Surgery

## 2011-11-10 ENCOUNTER — Ambulatory Visit (INDEPENDENT_AMBULATORY_CARE_PROVIDER_SITE_OTHER): Payer: Self-pay | Admitting: Orthopedic Surgery

## 2011-11-10 ENCOUNTER — Encounter: Payer: Self-pay | Admitting: Orthopedic Surgery

## 2011-11-10 VITALS — BP 90/50 | Ht 66.0 in | Wt 128.0 lb

## 2011-11-10 DIAGNOSIS — M25569 Pain in unspecified knee: Secondary | ICD-10-CM

## 2011-11-10 MED ORDER — HYDROCODONE-ACETAMINOPHEN 5-325 MG PO TABS: 1.0000 | ORAL_TABLET | ORAL | Status: DC | PRN

## 2011-11-10 NOTE — Progress Notes (Signed)
Patient ID: Colleen Lee, female   DOB: 11-27-1955, 56 y.o.   MRN: [id number] Chief Complaint  Patient presents with  . Follow-up    3 month recheck left knee   Status post medial tibial plateau fracture, status post arthroscopy, LEFT knee for medial meniscal tear.  Complaints of medial knee pain, giving out, and varus malalignment.  Repeat x-ray today shows that the knee is in varus. The fracture did heal. The joint lines are fairly well maintained.  I did give her an injection to help with the joint pain and continued her on Vicodin/Norco for pain. She also takes often help with swelling.  We will reassess the knee as needed. She will need of replacement with a stemmed implant and possible augments.  The injection was done as follows.  Knee  Injection Procedure Note  Pre-operative Diagnosis: left knee oa  Post-operative Diagnosis: same  Indications: pain  Anesthesia: ethyl chloride   Procedure Details   Verbal consent was obtained for the procedure. Time out was completed.The joint was prepped with alcohol, followed by  Ethyl chloride spray and A 20 gauge needle was inserted into the knee via lateral approach; 4ml 1% lidocaine and 1 ml of depomedrol  was then injected into the joint . The needle was removed and the area cleansed and dressed.  Complications:  None; patient tolerated the procedure well.  Separate x-ray report AP, lateral, and a patellar view. Findings the knee is in varus. The joint lines are fairly well maintained. The patella was tracked normally. The previous fracture is healed with a sclerotic line on the medial tibia.  Impression healed medial tibial plateau fracture with varus alignment

## 2011-11-10 NOTE — Patient Instructions (Signed)
Continue ibuprofen and Norco for pain

## 2012-01-26 ENCOUNTER — Ambulatory Visit: Payer: Self-pay | Admitting: Orthopedic Surgery

## 2012-02-09 ENCOUNTER — Encounter: Payer: Self-pay | Admitting: Orthopedic Surgery

## 2012-02-09 ENCOUNTER — Ambulatory Visit (INDEPENDENT_AMBULATORY_CARE_PROVIDER_SITE_OTHER): Payer: Self-pay | Admitting: Orthopedic Surgery

## 2012-02-09 VITALS — BP 120/60 | Ht 66.0 in | Wt 120.0 lb

## 2012-02-09 DIAGNOSIS — M1710 Unilateral primary osteoarthritis, unspecified knee: Secondary | ICD-10-CM

## 2012-02-09 DIAGNOSIS — M179 Osteoarthritis of knee, unspecified: Secondary | ICD-10-CM

## 2012-02-09 DIAGNOSIS — IMO0002 Reserved for concepts with insufficient information to code with codable children: Secondary | ICD-10-CM

## 2012-02-09 MED ORDER — HYDROCODONE-ACETAMINOPHEN 5-325 MG PO TABS: 1.0000 | ORAL_TABLET | ORAL | Status: DC | PRN

## 2012-02-09 MED ORDER — HYDROCODONE-ACETAMINOPHEN 5-325 MG PO TABS: 2.0000 | ORAL_TABLET | ORAL | Status: DC | PRN

## 2012-02-09 NOTE — Patient Instructions (Signed)
You have received a steroid shot. 15% of patients experience increased pain at the injection site with in the next 24 hours. This is best treated with ice and tylenol extra strength 2 tabs every 8 hours. If you are still having pain please call the office.    

## 2012-02-09 NOTE — Progress Notes (Signed)
Patient ID: Colleen Lee, female   DOB: [birth year], 56 y.o.   MRN: [id number] Chief Complaint  Patient presents with  . 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 Time out was completed.The joint was prepped with alcohol, followed by  Ethyl chloride spray and A 20 gauge needle was inserted into the knee via lateral approach; 4ml 1% lidocaine and 1 ml of depomedrol  was then injected into the joint . The needle was removed and the area cleansed and dressed.  Complications:  None; patient tolerated the procedure well.

## 2012-03-23 ENCOUNTER — Emergency Department (HOSPITAL_COMMUNITY): Payer: Medicaid Other

## 2012-03-23 ENCOUNTER — Encounter (HOSPITAL_COMMUNITY): Payer: Self-pay | Admitting: *Deleted

## 2012-03-23 ENCOUNTER — Emergency Department (HOSPITAL_COMMUNITY)
Admission: EM | Admit: 2012-03-23 | Discharge: 2012-03-23 | Disposition: A | Discharge status: Home / Self Care | Payer: Medicaid Other | Attending: Emergency Medicine | Admitting: Emergency Medicine

## 2012-03-23 DIAGNOSIS — D259 Leiomyoma of uterus, unspecified: Secondary | ICD-10-CM | POA: Insufficient documentation

## 2012-03-23 DIAGNOSIS — F17200 Nicotine dependence, unspecified, uncomplicated: Secondary | ICD-10-CM | POA: Insufficient documentation

## 2012-03-23 DIAGNOSIS — I10 Essential (primary) hypertension: Secondary | ICD-10-CM | POA: Insufficient documentation

## 2012-03-23 DIAGNOSIS — R112 Nausea with vomiting, unspecified: Secondary | ICD-10-CM | POA: Insufficient documentation

## 2012-03-23 DIAGNOSIS — R109 Unspecified abdominal pain: Secondary | ICD-10-CM | POA: Insufficient documentation

## 2012-03-23 LAB — WET PREP, GENITAL
Clue Cells Wet Prep HPF POC: NONE SEEN
Trich, Wet Prep: NONE SEEN

## 2012-03-23 LAB — CBC WITH DIFFERENTIAL/PLATELET
Basophils Absolute: 0 10*3/uL (ref 0.0–0.1)
Basophils Relative: 0 % (ref 0–1)
Eosinophils Absolute: 0 10*3/uL (ref 0.0–0.7)
Eosinophils Relative: 0 % (ref 0–5)
HCT: 38.3 % (ref 36.0–46.0)
Hemoglobin: 13.5 g/dL (ref 12.0–15.0)
Lymphocytes Relative: 23 % (ref 12–46)
Lymphs Abs: 1.3 10*3/uL (ref 0.7–4.0)
MCH: 32.5 pg (ref 26.0–34.0)
MCHC: 35.2 g/dL (ref 30.0–36.0)
MCV: 92.1 fL (ref 78.0–100.0)
Monocytes Absolute: 0.6 10*3/uL (ref 0.1–1.0)
Monocytes Relative: 10 % (ref 3–12)
Neutro Abs: 3.9 10*3/uL (ref 1.7–7.7)
Neutrophils Relative %: 67 % (ref 43–77)
Platelets: 149 10*3/uL — ABNORMAL LOW (ref 150–400)
RBC: 4.16 MIL/uL (ref 3.87–5.11)
RDW: 14 % (ref 11.5–15.5)
WBC: 5.8 10*3/uL (ref 4.0–10.5)

## 2012-03-23 LAB — COMPREHENSIVE METABOLIC PANEL
ALT: 25 U/L (ref 0–35)
AST: 23 U/L (ref 0–37)
Albumin: 3.5 g/dL (ref 3.5–5.2)
Alkaline Phosphatase: 51 U/L (ref 39–117)
BUN: 5 mg/dL — ABNORMAL LOW (ref 6–23)
CO2: 23 mEq/L (ref 19–32)
Calcium: 10.3 mg/dL (ref 8.4–10.5)
Chloride: 102 mEq/L (ref 96–112)
Creatinine, Ser: 0.69 mg/dL (ref 0.50–1.10)
GFR calc Af Amer: >90 mL/min (ref >90)
GFR calc non Af Amer: >90 mL/min (ref >90)
Glucose, Bld: 81 mg/dL (ref 70–99)
Potassium: 3.1 mEq/L — ABNORMAL LOW (ref 3.5–5.1)
Sodium: 133 mEq/L — ABNORMAL LOW (ref 135–145)
Total Bilirubin: 0.6 mg/dL (ref 0.3–1.2)
Total Protein: 7.4 g/dL (ref 6.0–8.3)

## 2012-03-23 LAB — URINALYSIS, ROUTINE W REFLEX MICROSCOPIC
Glucose, UA: NEGATIVE mg/dL
Hgb urine dipstick: NEGATIVE
Ketones, ur: NEGATIVE mg/dL
Leukocytes, UA: NEGATIVE
Nitrite: NEGATIVE
Specific Gravity, Urine: 1.03 (ref 1.005–1.030)
Urobilinogen, UA: 1 mg/dL (ref 0.0–1.0)
pH: 6 (ref 5.0–8.0)

## 2012-03-23 LAB — URINE MICROSCOPIC-ADD ON

## 2012-03-23 LAB — LIPASE, BLOOD: Lipase: 13 U/L (ref 11–59)

## 2012-03-23 LAB — PREGNANCY, URINE: Preg Test, Ur: NEGATIVE

## 2012-03-23 MED ORDER — MORPHINE SULFATE 4 MG/ML IJ SOLN
4.0000 mg | Freq: Once | INTRAMUSCULAR | Status: AC
  Administered 2012-03-23: 4 mg via INTRAVENOUS
  Filled 2012-03-23: qty 1

## 2012-03-23 MED ORDER — IOHEXOL 300 MG/ML  SOLN
100.0000 mL | Freq: Once | INTRAMUSCULAR | Status: AC | PRN
  Administered 2012-03-23: 100 mL via INTRAVENOUS

## 2012-03-23 MED ORDER — SODIUM CHLORIDE 0.9 % IV SOLN
INTRAVENOUS | Status: DC
  Administered 2012-03-23: 13:00:00 via INTRAVENOUS

## 2012-03-23 MED ORDER — ONDANSETRON HCL 4 MG/2ML IJ SOLN
4.0000 mg | Freq: Once | INTRAMUSCULAR | Status: AC
  Administered 2012-03-23: 4 mg via INTRAVENOUS
  Filled 2012-03-23: qty 2

## 2012-03-23 MED ORDER — OXYCODONE-ACETAMINOPHEN 5-325 MG PO TABS: 1.0000 | ORAL_TABLET | ORAL | Status: AC | PRN

## 2012-03-23 MED ORDER — SODIUM CHLORIDE 0.9 % IV SOLN
1000.0000 mL | INTRAVENOUS | Status: DC
  Administered 2012-03-23: 1000 mL via INTRAVENOUS

## 2012-03-23 MED ORDER — HYDROMORPHONE HCL PF 1 MG/ML IJ SOLN
1.0000 mg | Freq: Once | INTRAMUSCULAR | Status: AC
  Administered 2012-03-23: 1 mg via INTRAVENOUS
  Filled 2012-03-23: qty 1

## 2012-03-23 NOTE — ED Notes (Signed)
Pt c/o abdominal pain, nausea, vomiting x 7-8 months. Pt states that she has lost a lot of weight.

## 2012-03-23 NOTE — ED Provider Notes (Signed)
History    This chart was scribed for Osvaldo Human, MD, MD by Smitty Pluck. The patient was seen in room APA04 and the patient's care was started at 12:31PM.   CSN: [id number]  Arrival date & time 03/23/12  1106   First MD Initiated Contact with Patient 03/23/12 1136      Chief Complaint  Patient presents with  . Abdominal Pain    (Consider location/radiation/quality/duration/timing/severity/associated sxs/prior treatment) The history is provided by the patient.   Colleen Lee is a 56 y.o. female who presents to the Emergency Department complaining of constant moderate abdominal pain onset 1 week ago with symptoms worsening today. Pt reports having emesis and nausea. Denies diarrhea, dysuria and constipation. Pt reports that she no longer has menstrual cycle. Denies abdominal surgeries. Pt reports having knee surgery. Pt reports smoking 1.5 packs of cigarettes daily and drinking alcohol. She reports having cough, diaphoresis and chills. She reports feeling like she might faint when she stands up. Reports allergies to penicillin.   Past Medical History  Diagnosis Date  . Hypertension     Past Surgical History  Procedure Date  . Knee surgery     left knee    Family History  Problem Relation Age of Onset  . Cancer      FH  . Asthma      FH    History  Substance Use Topics  . Smoking status: Current Everyday Smoker -- 0.5 packs/day    Types: Cigarettes  . Smokeless tobacco: Not on file  . Alcohol Use: Yes    OB History    Grav Para Term Preterm Abortions TAB SAB Ect Mult Living                  Review of Systems  All other systems reviewed and are negative.  10 Systems reviewed and all are negative for acute change except as noted in the HPI.    Allergies  Penicillins  Home Medications   Current Outpatient Rx  Name Route Sig Dispense Refill  . VITAMIN B 12 PO Oral Take 1 tablet by mouth daily.    Marland Kitchen HYDROCODONE-ACETAMINOPHEN 5-325 MG PO TABS Oral  Take 1 tablet by mouth every 4 (four) hours as needed for pain. For pain 90 tablet 5  . IBUPROFEN 800 MG PO TABS Oral Take 1 tablet (800 mg total) by mouth every 8 (eight) hours as needed. 90 tablet 5  . LISINOPRIL-HYDROCHLOROTHIAZIDE 20-25 MG PO TABS Oral Take 1 tablet by mouth daily.        BP 139/72  Pulse 74  Temp 97.6 F (36.4 C) (Oral)  Resp 18  Ht 5\' 6"  (1.676 m)  Wt 117 lb 1.6 oz (53.116 kg)  BMI 18.90 kg/m2  SpO2 100%  Physical Exam  Nursing note and vitals reviewed. Constitutional: She is oriented to person, place, and time. No distress.       Appears malnourished   HENT:  Head: Normocephalic and atraumatic.  Neck: Normal range of motion. Neck supple.  Cardiovascular: Normal rate, regular rhythm and normal heart sounds.   Pulmonary/Chest: Effort normal and breath sounds normal. No respiratory distress. She has no wheezes.  Abdominal: She exhibits distension. She exhibits no mass. There is tenderness in the left upper quadrant and left lower quadrant.  Genitourinary:       Pelvic exam shows normal female external.  Speculum exam shows no dicharge.  Bimanual exam shows uterine enlargement and mild tenderness, no adnexal mass or  tenderness.  GC, chlamydia and wet prep submitted.  Musculoskeletal: She exhibits no edema.  Lymphadenopathy:    She has no cervical adenopathy.  Neurological: She is alert and oriented to person, place, and time. No cranial nerve deficit.  Skin: Skin is warm and dry.  Psychiatric: She has a normal mood and affect. Her behavior is normal.    ED Course  Procedures (including critical care time) DIAGNOSTIC STUDIES: Oxygen Saturation is 100% on room air, normal by my interpretation.    COORDINATION OF CARE: 12:51PM Ordered   Medications  Cyanocobalamin (VITAMIN B 12 PO) (not administered)  0.9 %  sodium chloride infusion (not administered)  HYDROmorphone (DILAUDID) injection 1 mg (not administered)  ondansetron (ZOFRAN) injection 4 mg (not  administered)  0.9 %  sodium chloride infusion (not administered)      Results for orders placed during the hospital encounter of 03/23/12  URINALYSIS, ROUTINE W REFLEX MICROSCOPIC      Component Value Range   Color, Urine YELLOW  YELLOW   APPearance CLEAR  CLEAR   Specific Gravity, Urine 1.030  1.005 - 1.030   pH 6.0  5.0 - 8.0   Glucose, UA NEGATIVE  NEGATIVE mg/dL   Hgb urine dipstick NEGATIVE  NEGATIVE   Bilirubin Urine SMALL (*) NEGATIVE   Ketones, ur NEGATIVE  NEGATIVE mg/dL   Protein, ur TRACE (*) NEGATIVE mg/dL   Urobilinogen, UA 1.0  0.0 - 1.0 mg/dL   Nitrite NEGATIVE  NEGATIVE   Leukocytes, UA NEGATIVE  NEGATIVE  URINE MICROSCOPIC-ADD ON      Component Value Range   Squamous Epithelial / LPF RARE  RARE   WBC, UA 3-6  <3 WBC/hpf  COMPREHENSIVE METABOLIC PANEL      Component Value Range   Sodium 133 (*) 135 - 145 mEq/L   Potassium 3.1 (*) 3.5 - 5.1 mEq/L   Chloride 102  96 - 112 mEq/L   CO2 23  19 - 32 mEq/L   Glucose, Bld 81  70 - 99 mg/dL   BUN 5 (*) 6 - 23 mg/dL   Creatinine, Ser 1.61  0.50 - 1.10 mg/dL   Calcium 09.6  8.4 - 04.5 mg/dL   Total Protein 7.4  6.0 - 8.3 g/dL   Albumin 3.5  3.5 - 5.2 g/dL   AST 23  0 - 37 U/L   ALT 25  0 - 35 U/L   Alkaline Phosphatase 51  39 - 117 U/L   Total Bilirubin 0.6  0.3 - 1.2 mg/dL   GFR calc non Af Amer >90  >90 mL/min   GFR calc Af Amer >90  >90 mL/min  LIPASE, BLOOD      Component Value Range   Lipase 13  11 - 59 U/L  CBC WITH DIFFERENTIAL      Component Value Range   WBC 5.8  4.0 - 10.5 K/uL   RBC 4.16  3.87 - 5.11 MIL/uL   Hemoglobin 13.5  12.0 - 15.0 g/dL   HCT 40.9  81.1 - 91.4 %   MCV 92.1  78.0 - 100.0 fL   MCH 32.5  26.0 - 34.0 pg   MCHC 35.2  30.0 - 36.0 g/dL   RDW 78.2  95.6 - 21.3 %   Platelets 149 (*) 150 - 400 K/uL   Neutrophils Relative 67  43 - 77 %   Neutro Abs 3.9  1.7 - 7.7 K/uL   Lymphocytes Relative 23  12 - 46 %   Lymphs Abs  1.3  0.7 - 4.0 K/uL   Monocytes Relative 10  3 - 12 %    Monocytes Absolute 0.6  0.1 - 1.0 K/uL   Eosinophils Relative 0  0 - 5 %   Eosinophils Absolute 0.0  0.0 - 0.7 K/uL   Basophils Relative 0  0 - 1 %   Basophils Absolute 0.0  0.0 - 0.1 K/uL   Ct Abdomen Pelvis W Contrast  03/23/2012  *RADIOLOGY REPORT*  Clinical Data: Abdominal pain for 1 week.  Nausea vomiting. Hypertension.  CT ABDOMEN AND PELVIS WITH CONTRAST  Technique:  Multidetector CT imaging of the abdomen and pelvis was performed following the standard protocol during bolus administration of intravenous contrast.  Contrast: OMNIPAQUE IOHEXOL 300 MG/ML  SOLN  Comparison: 08/31/2009 and 05/01/2008  Findings: Clear lung bases.  Mild cardiomegaly. No pericardial or pleural effusion.  Focal steatosis adjacent the falciform ligament. Normal spleen, stomach.  Mildly dilated pancreatic duct within the head and neck.  This measures 4 mm on image 27 of series 2 and is favored to be unchanged.  Follow to the level of the ampulla, without obstructive stone or mass.  Common duct maximally 6 mm, upper normal.  Normal gallbladder, adrenal glands, kidneys. No retroperitoneal or retrocrural adenopathy.  Normal colon, appendix, and terminal ileum.  Normal small bowel without abdominal ascites.  Prominent gastroepiploic vein which drains into the branches of the superior mesenteric vein.  Example image 36.  No cause identified.    No pelvic adenopathy.  Normal urinary bladder.  Numerous uterine masses, with calcification in a right-sided mass.  No definite adnexal mass (degraded evaluation secondary to extent of uterine fibroids).  Trace cul-de-sac fluid on image 58.  No acute osseous abnormality.  IMPRESSION:  1. No acute process or explanation for abdominal pain/nausea/vomiting. 2.  Uterine fibroids. 3.  Mild pancreatic ductal dilatation, without cause identified. Favored to be similar to on the prior exam. 4.  Trace cul-de-sac fluid.  This could be physiologic if the patient is premenopausal.  If post  menopausal, this is nonspecific.   Original Report Authenticated By: Consuello Bossier, M.D.     Lab workup suggests pt's pain is coming from uterine fibroids.  Case discussed with Dr. Christin Bach, on call for GYN, who will see pt in his office tomorrow at 9 A.M.  Rx Percocet q4h prn pain.    1. Abdominal pain   2. Uterine fibroid     I personally performed the services described in this documentation, which was scribed in my presence. The recorded information has been reviewed and considered.  Osvaldo Human, MD     Carleene Cooper III, MD 03/23/12 [id number]

## 2012-03-24 ENCOUNTER — Other Ambulatory Visit: Payer: Self-pay | Admitting: Obstetrics and Gynecology

## 2012-03-24 DIAGNOSIS — R109 Unspecified abdominal pain: Secondary | ICD-10-CM

## 2012-03-24 LAB — GC/CHLAMYDIA PROBE AMP, GENITAL
Chlamydia, DNA Probe: NEGATIVE
GC Probe Amp, Genital: NEGATIVE

## 2012-03-28 ENCOUNTER — Ambulatory Visit (HOSPITAL_COMMUNITY)
Admission: RE | Admit: 2012-03-28 | Discharge: 2012-03-28 | Disposition: A | Discharge status: Home / Self Care | Payer: Medicaid Other | Source: Ambulatory Visit | Attending: Obstetrics and Gynecology | Admitting: Obstetrics and Gynecology

## 2012-03-28 DIAGNOSIS — R109 Unspecified abdominal pain: Secondary | ICD-10-CM

## 2012-03-28 DIAGNOSIS — R9389 Abnormal findings on diagnostic imaging of other specified body structures: Secondary | ICD-10-CM | POA: Insufficient documentation

## 2012-04-04 ENCOUNTER — Encounter (HOSPITAL_COMMUNITY): Payer: Self-pay | Admitting: *Deleted

## 2012-04-04 ENCOUNTER — Emergency Department (HOSPITAL_COMMUNITY)
Admission: EM | Admit: 2012-04-04 | Discharge: 2012-04-04 | Discharge status: Against Medical Advice | Payer: Medicaid Other | Attending: Emergency Medicine | Admitting: Emergency Medicine

## 2012-04-04 DIAGNOSIS — I959 Hypotension, unspecified: Secondary | ICD-10-CM | POA: Insufficient documentation

## 2012-04-04 DIAGNOSIS — R42 Dizziness and giddiness: Secondary | ICD-10-CM | POA: Insufficient documentation

## 2012-04-04 NOTE — ED Notes (Signed)
Pt c/o dizziness for two days, low blood pressure at home states that blood pressure was reading 85/55 at home. bp at triage 110/67

## 2012-04-04 NOTE — ED Notes (Signed)
No answer in waiting room 

## 2012-04-19 ENCOUNTER — Other Ambulatory Visit: Payer: Self-pay | Admitting: Orthopedic Surgery

## 2012-05-24 ENCOUNTER — Other Ambulatory Visit: Payer: Self-pay | Admitting: Obstetrics and Gynecology

## 2012-06-15 ENCOUNTER — Other Ambulatory Visit: Payer: Self-pay | Admitting: Orthopedic Surgery

## 2012-06-15 DIAGNOSIS — M1710 Unilateral primary osteoarthritis, unspecified knee: Secondary | ICD-10-CM

## 2012-06-15 MED ORDER — HYDROCODONE-ACETAMINOPHEN 5-325 MG PO TABS: 1.0000 | ORAL_TABLET | ORAL | Status: DC | PRN

## 2012-07-04 ENCOUNTER — Ambulatory Visit (INDEPENDENT_AMBULATORY_CARE_PROVIDER_SITE_OTHER): Payer: Self-pay | Admitting: Orthopedic Surgery

## 2012-07-04 VITALS — Ht 66.0 in | Wt 120.0 lb

## 2012-07-04 DIAGNOSIS — Z9889 Other specified postprocedural states: Secondary | ICD-10-CM

## 2012-07-04 DIAGNOSIS — M23305 Other meniscus derangements, unspecified medial meniscus, unspecified knee: Secondary | ICD-10-CM

## 2012-07-04 DIAGNOSIS — S82143A Displaced bicondylar fracture of unspecified tibia, initial encounter for closed fracture: Secondary | ICD-10-CM

## 2012-07-04 DIAGNOSIS — G5792 Unspecified mononeuropathy of left lower limb: Secondary | ICD-10-CM

## 2012-07-04 DIAGNOSIS — M5430 Sciatica, unspecified side: Secondary | ICD-10-CM

## 2012-07-04 DIAGNOSIS — M25569 Pain in unspecified knee: Secondary | ICD-10-CM

## 2012-07-04 MED ORDER — GABAPENTIN 100 MG PO CAPS: 100.0000 mg | ORAL_CAPSULE | Freq: Three times a day (TID) | ORAL | Status: DC

## 2012-07-04 MED ORDER — IBUPROFEN 800 MG PO TABS: 800.0000 mg | ORAL_TABLET | Freq: Three times a day (TID) | ORAL | Status: DC | PRN

## 2012-07-04 NOTE — Progress Notes (Signed)
Patient ID: MODESTINE SCHERZINGER, female   DOB: 1956/02/07, 56 y.o.   MRN: [id number] Chief Complaint  Patient presents with  . Follow-up    Requesting left knee injection.    Knee  Injection Procedure Note  Pre-operative Diagnosis: left knee oa  Post-operative Diagnosis: same  Indications: pain  Anesthesia: ethyl chloride   Procedure Details   Verbal consent was obtained for the procedure. Time out was completed.The joint was prepped with alcohol, followed by  Ethyl chloride spray and A 20 gauge needle was inserted into the knee via lateral approach; 4ml 1% lidocaine and 1 ml of depomedrol  was then injected into the joint . The needle was removed and the area cleansed and dressed.  Complications:  None; patient tolerated the procedure well.

## 2012-07-04 NOTE — Patient Instructions (Addendum)
You have received a steroid shot. 15% of patients experience increased pain at the injection site with in the next 24 hours. This is best treated with ice and tylenol extra strength 2 tabs every 8 hours. If you are still having pain please call the office.    

## 2012-07-17 ENCOUNTER — Encounter (HOSPITAL_COMMUNITY): Payer: Self-pay

## 2012-07-17 ENCOUNTER — Emergency Department (HOSPITAL_COMMUNITY): Payer: Medicaid Other

## 2012-07-17 ENCOUNTER — Emergency Department (HOSPITAL_COMMUNITY)
Admission: EM | Admit: 2012-07-17 | Discharge: 2012-07-17 | Disposition: A | Discharge status: Home / Self Care | Payer: Medicaid Other | Attending: Emergency Medicine | Admitting: Emergency Medicine

## 2012-07-17 DIAGNOSIS — Z79899 Other long term (current) drug therapy: Secondary | ICD-10-CM | POA: Insufficient documentation

## 2012-07-17 DIAGNOSIS — D259 Leiomyoma of uterus, unspecified: Secondary | ICD-10-CM | POA: Insufficient documentation

## 2012-07-17 DIAGNOSIS — I10 Essential (primary) hypertension: Secondary | ICD-10-CM | POA: Insufficient documentation

## 2012-07-17 DIAGNOSIS — R110 Nausea: Secondary | ICD-10-CM | POA: Insufficient documentation

## 2012-07-17 DIAGNOSIS — M545 Low back pain, unspecified: Secondary | ICD-10-CM | POA: Insufficient documentation

## 2012-07-17 DIAGNOSIS — F17200 Nicotine dependence, unspecified, uncomplicated: Secondary | ICD-10-CM | POA: Insufficient documentation

## 2012-07-17 DIAGNOSIS — R109 Unspecified abdominal pain: Secondary | ICD-10-CM | POA: Insufficient documentation

## 2012-07-17 HISTORY — DX: Benign neoplasm of connective and other soft tissue, unspecified: D21.9

## 2012-07-17 LAB — URINALYSIS, ROUTINE W REFLEX MICROSCOPIC
Bilirubin Urine: NEGATIVE
Glucose, UA: NEGATIVE mg/dL
Hgb urine dipstick: NEGATIVE
Ketones, ur: NEGATIVE mg/dL
Leukocytes, UA: NEGATIVE
Nitrite: NEGATIVE
Protein, ur: NEGATIVE mg/dL
Specific Gravity, Urine: 1.02 (ref 1.005–1.030)
Urobilinogen, UA: 0.2 mg/dL (ref 0.0–1.0)
pH: 7 (ref 5.0–8.0)

## 2012-07-17 LAB — BASIC METABOLIC PANEL
BUN: 7 mg/dL (ref 6–23)
CO2: 25 mEq/L (ref 19–32)
Calcium: 10.5 mg/dL (ref 8.4–10.5)
Chloride: 104 mEq/L (ref 96–112)
Creatinine, Ser: 0.69 mg/dL (ref 0.50–1.10)
GFR calc Af Amer: >90 mL/min (ref >90)
GFR calc non Af Amer: >90 mL/min (ref >90)
Glucose, Bld: 107 mg/dL — ABNORMAL HIGH (ref 70–99)
Potassium: 3.7 mEq/L (ref 3.5–5.1)
Sodium: 138 mEq/L (ref 135–145)

## 2012-07-17 LAB — HEPATIC FUNCTION PANEL
ALT: 55 U/L — ABNORMAL HIGH (ref 0–35)
AST: 41 U/L — ABNORMAL HIGH (ref 0–37)
Albumin: 4.6 g/dL (ref 3.5–5.2)
Alkaline Phosphatase: 77 U/L (ref 39–117)
Bilirubin, Direct: 0.3 mg/dL (ref 0.0–0.3)
Indirect Bilirubin: 0.8 mg/dL (ref 0.3–0.9)
Total Bilirubin: 1.1 mg/dL (ref 0.3–1.2)
Total Protein: 9.3 g/dL — ABNORMAL HIGH (ref 6.0–8.3)

## 2012-07-17 LAB — LIPASE, BLOOD: Lipase: 36 U/L (ref 11–59)

## 2012-07-17 LAB — CBC
HCT: 44.5 % (ref 36.0–46.0)
Hemoglobin: 15.3 g/dL — ABNORMAL HIGH (ref 12.0–15.0)
MCH: 31.4 pg (ref 26.0–34.0)
MCHC: 34.4 g/dL (ref 30.0–36.0)
MCV: 91.4 fL (ref 78.0–100.0)
Platelets: 167 10*3/uL (ref 150–400)
RBC: 4.87 MIL/uL (ref 3.87–5.11)
RDW: 13.4 % (ref 11.5–15.5)
WBC: 5.5 10*3/uL (ref 4.0–10.5)

## 2012-07-17 MED ORDER — HYDROCODONE-ACETAMINOPHEN 5-325 MG PO TABS: ORAL_TABLET | ORAL | Status: DC

## 2012-07-17 MED ORDER — METHOCARBAMOL 500 MG PO TABS: 1000.0000 mg | ORAL_TABLET | Freq: Four times a day (QID) | ORAL | Status: DC | PRN

## 2012-07-17 MED ORDER — ONDANSETRON 8 MG PO TBDP
8.0000 mg | ORAL_TABLET | Freq: Once | ORAL | Status: AC
  Administered 2012-07-17: 8 mg via ORAL
  Filled 2012-07-17: qty 1

## 2012-07-17 MED ORDER — HYDROMORPHONE HCL PF 2 MG/ML IJ SOLN
2.0000 mg | Freq: Once | INTRAMUSCULAR | Status: AC
  Administered 2012-07-17: 2 mg via INTRAMUSCULAR
  Filled 2012-07-17: qty 1

## 2012-07-17 NOTE — ED Notes (Signed)
Pt reports severe abd pain that radiates to her back for the past 2 weeks.  Pt reports n/v that began yesterday.  Pt denies any GU sx

## 2012-07-17 NOTE — ED Notes (Signed)
Iv attempt x 2 without success

## 2012-07-17 NOTE — ED Notes (Signed)
Attempted x 2 for IV access without success.  

## 2012-07-17 NOTE — ED Provider Notes (Signed)
History     CSN: [id number]  Arrival date & time 07/17/12  4098   First MD Initiated Contact with Patient 07/17/12 1136      Chief Complaint  Patient presents with  . Abdominal Pain  . Back Pain  . Nausea     HPI Pt was seen at 1200.  Per pt, c/o gradual onset and persistence of constant right sided low back "pain" for the past 2 weeks.  Has been associated with several episodes of N/V since yesterday.  Denies rash, no fevers, no abd pain, no diarrhea, no black or blood in stools or emesis, no CP/SOB, no cough, no dysuria/hematuria, no focal motor weakness, no tingling/numbness in extremities, no saddle anesthesia, no incont/retention of bowel or bladder.    Past Medical History  Diagnosis Date  . Hypertension   . Fibroids     Past Surgical History  Procedure Date  . Knee surgery     left knee    Family History  Problem Relation Age of Onset  . Cancer      FH  . Asthma      FH    History  Substance Use Topics  . Smoking status: Current Every Day Smoker -- 0.5 packs/day    Types: Cigarettes  . Smokeless tobacco: Not on file  . Alcohol Use: Yes    Review of Systems ROS: Statement: All systems negative except as marked or noted in the HPI; Constitutional: Negative for fever and chills. ; ; Eyes: Negative for eye pain, redness and discharge. ; ; ENMT: Negative for ear pain, hoarseness, nasal congestion, sinus pressure and sore throat. ; ; Cardiovascular: Negative for chest pain, palpitations, diaphoresis, dyspnea and peripheral edema. ; ; Respiratory: Negative for cough, wheezing and stridor. ; ; Gastrointestinal: +N/V. Negative for diarrhea, abdominal pain, blood in stool, hematemesis, jaundice and rectal bleeding. . ; ; Genitourinary: Negative for dysuria, flank pain and hematuria. ; ; Musculoskeletal: +low back pain. Negative for neck pain. Negative for swelling and trauma.; ; Skin: Negative for pruritus, rash, abrasions, blisters, bruising and skin lesion.; ; Neuro:  Negative for headache, lightheadedness and neck stiffness. Negative for weakness, altered level of consciousness , altered mental status, extremity weakness, paresthesias, involuntary movement, seizure and syncope.       Allergies  Penicillins  Home Medications   Current Outpatient Rx  Name  Route  Sig  Dispense  Refill  . VITAMIN B 12 PO   Oral   Take 1 tablet by mouth daily.         Marland Kitchen GABAPENTIN 100 MG PO CAPS   Oral   Take 1 capsule (100 mg total) by mouth 3 (three) times daily.   90 capsule   5   . HYDROCODONE-ACETAMINOPHEN 5-325 MG PO TABS   Oral   Take 1 tablet by mouth every 4 (four) hours as needed for pain. For pain   90 tablet   5   . IBUPROFEN 800 MG PO TABS   Oral   Take 1 tablet (800 mg total) by mouth every 8 (eight) hours as needed.   90 tablet   5   . LISINOPRIL-HYDROCHLOROTHIAZIDE 20-25 MG PO TABS   Oral   Take 1 tablet by mouth daily.             BP 98/67  Pulse 67  Temp 98.3 F (36.8 C) (Oral)  Resp 20  SpO2 100%  Physical Exam 1205: Physical examination:  Nursing notes reviewed; Vital signs and O2  SAT reviewed;  Constitutional: Well developed, Well nourished, Well hydrated, In no acute distress; Head:  Normocephalic, atraumatic; Eyes: EOMI, PERRL, No scleral icterus; ENMT: Mouth and pharynx normal, Mucous membranes moist; Neck: Supple, Full range of motion, No lymphadenopathy; Cardiovascular: Regular rate and rhythm, No murmur, rub, or gallop; Respiratory: Breath sounds clear & equal bilaterally, No rales, rhonchi, wheezes.  Speaking full sentences with ease, Normal respiratory effort/excursion; Chest: Nontender, Movement normal; Abdomen: Soft, Nontender, Nondistended, Normal bowel sounds; Genitourinary: No CVA tenderness; Spine:  No midline CS, TS, LS tenderness.  +TTP right lumbar paraspinal muscles.;; Extremities: Pulses normal, No tenderness, No edema, No calf edema or asymmetry.; Neuro: AA&Ox3, Major CN grossly intact.  Speech clear.  Strength 5/5 equal bilat UE's and LE's, including great toe dorsiflexion.  DTR 2/4 equal bilat UE's and LE's.  No gross sensory deficits.  Neg straight leg raises bilat.;; Skin: Color normal, Warm, Dry.   ED Course  Procedures    MDM  MDM Reviewed: nursing note, vitals and previous chart Interpretation: labs and CT scan   Results for orders placed during the hospital encounter of 07/17/12  URINALYSIS, ROUTINE W REFLEX MICROSCOPIC      Component Value Range   Color, Urine AMBER (*) YELLOW   APPearance CLEAR  CLEAR   Specific Gravity, Urine 1.020  1.005 - 1.030   pH 7.0  5.0 - 8.0   Glucose, UA NEGATIVE  NEGATIVE mg/dL   Hgb urine dipstick NEGATIVE  NEGATIVE   Bilirubin Urine NEGATIVE  NEGATIVE   Ketones, ur NEGATIVE  NEGATIVE mg/dL   Protein, ur NEGATIVE  NEGATIVE mg/dL   Urobilinogen, UA 0.2  0.0 - 1.0 mg/dL   Nitrite NEGATIVE  NEGATIVE   Leukocytes, UA NEGATIVE  NEGATIVE  CBC      Component Value Range   WBC 5.5  4.0 - 10.5 K/uL   RBC 4.87  3.87 - 5.11 MIL/uL   Hemoglobin 15.3 (*) 12.0 - 15.0 g/dL   HCT 16.1  09.6 - 04.5 %   MCV 91.4  78.0 - 100.0 fL   MCH 31.4  26.0 - 34.0 pg   MCHC 34.4  30.0 - 36.0 g/dL   RDW 40.9  81.1 - 91.4 %   Platelets 167  150 - 400 K/uL  BASIC METABOLIC PANEL      Component Value Range   Sodium 138  135 - 145 mEq/L   Potassium 3.7  3.5 - 5.1 mEq/L   Chloride 104  96 - 112 mEq/L   CO2 25  19 - 32 mEq/L   Glucose, Bld 107 (*) 70 - 99 mg/dL   BUN 7  6 - 23 mg/dL   Creatinine, Ser 7.82  0.50 - 1.10 mg/dL   Calcium 95.6  8.4 - 21.3 mg/dL   GFR calc non Af Amer >90  >90 mL/min   GFR calc Af Amer >90  >90 mL/min  LIPASE, BLOOD      Component Value Range   Lipase 36  11 - 59 U/L  HEPATIC FUNCTION PANEL      Component Value Range   Total Protein 9.3 (*) 6.0 - 8.3 g/dL   Albumin 4.6  3.5 - 5.2 g/dL   AST 41 (*) 0 - 37 U/L   ALT 55 (*) 0 - 35 U/L   Alkaline Phosphatase 77  39 - 117 U/L   Total Bilirubin 1.1  0.3 - 1.2 mg/dL   Bilirubin,  Direct 0.3  0.0 - 0.3 mg/dL   Indirect  Bilirubin 0.8  0.3 - 0.9 mg/dL   Ct Abdomen Pelvis Wo Contrast 07/17/2012  *RADIOLOGY REPORT*  Clinical Data: Abdominal pain, back pain, and nausea.  Left flank pain for 1 week.  CT ABDOMEN AND PELVIS WITHOUT CONTRAST  Technique:  Multidetector CT imaging of the abdomen and pelvis was performed following the standard protocol without intravenous contrast.  Comparison: CT abdomen pelvis 03/23/2012, CT abdomen pelvis 05/01/2008.  Findings: The imaged lung bases are clear.  The kidneys are normal in size and contour.  No stones are identified within either kidney.  There is no hydronephrosis or perinephric stranding.  Both ureters are normal in caliber.  No ureteral stone is identified.  A tiny calcification in the left lower quadrant on image number 48 is unchanged compared to prior CT of  September 2009, and is consistent with a phlebolith in the gonadal vein. Urinary bladder appears normal.  The noncontrast appearance of the liver, gallbladder, spleen, and adrenal glands is within normal limits.  Mildly dilated pancreatic duct within the pancreatic head and neck. This measures 4 mm on image #21 and is unchanged compared to the abdominal CT of 03/23/2012.  Bowel loops are normal in caliber. Appendix is normal (image #50). Stomach is unremarkable.  The uterus is enlarged and contains a partially calcified mass(es) most consistent with uterine fibroids.  This appearance is stable.  Heavy atherosclerotic calcification of the normal caliber abdominal aorta.  Negative for abdominal or pelvic lymphadenopathy or ascites.  Mild degenerative changes of both hips.  No acute or suspicious bony abnormality.  IMPRESSION:  1.  Negative for urinary tract stone disease or obstruction. 2.  No acute process is identified in the abdomen or pelvis to explain the patient's pain. 3.  Mild pancreatic ductal dilatation is stable, and without identifiable cause. 4.  Uterine fibroids.   Original  Report Authenticated By: Britta Mccreedy, M.D.     Results for JANARIA, MCCAMMON (MRN [id number]) as of 07/17/2012 14:03  Ref. Range 08/04/2009 14:10 12/12/2010 15:30 01/12/2011 12:11 09/27/2011 20:53 03/23/2012 13:01 07/17/2012 12:02  AST Latest Range: 0-37 U/L 47 (H) 28 14 35 23 41 (H)  ALT Latest Range: 0-35 U/L 55 (H) 34 19 44 (H) 25 55 (H)      1400:  Pain improved after meds. Tol PO well without N/V while in the ED. No stooling while in the ED.  LFT's mildly elevated per baseline.  Has climbed off the stretcher, gotten herself dressed, and is walking around the ED with steady gait, easy resps, NAD.  No acute findings on workup today, will tx symptomatically.  Wants to go home now.  Dx and testing d/w pt and family.  Questions answered.  Verb understanding, agreeable to d/c home with outpt f/u.          Laray Anger, DO 07/19/12 1328

## 2012-07-17 NOTE — ED Notes (Signed)
MD at bedside. 

## 2012-07-19 LAB — URINE CULTURE: Colony Count: 4000

## 2012-08-02 DIAGNOSIS — A048 Other specified bacterial intestinal infections: Secondary | ICD-10-CM

## 2012-08-02 HISTORY — DX: Other specified bacterial intestinal infections: A04.8

## 2012-08-04 ENCOUNTER — Encounter (HOSPITAL_COMMUNITY): Payer: Self-pay

## 2012-08-04 ENCOUNTER — Emergency Department (HOSPITAL_COMMUNITY)
Admission: EM | Admit: 2012-08-04 | Discharge: 2012-08-04 | Disposition: A | Discharge status: Home / Self Care | Payer: Medicaid Other | Attending: Emergency Medicine | Admitting: Emergency Medicine

## 2012-08-04 DIAGNOSIS — K529 Noninfective gastroenteritis and colitis, unspecified: Secondary | ICD-10-CM

## 2012-08-04 DIAGNOSIS — R509 Fever, unspecified: Secondary | ICD-10-CM | POA: Insufficient documentation

## 2012-08-04 DIAGNOSIS — K5289 Other specified noninfective gastroenteritis and colitis: Secondary | ICD-10-CM | POA: Insufficient documentation

## 2012-08-04 DIAGNOSIS — Z8742 Personal history of other diseases of the female genital tract: Secondary | ICD-10-CM | POA: Insufficient documentation

## 2012-08-04 DIAGNOSIS — E876 Hypokalemia: Secondary | ICD-10-CM | POA: Insufficient documentation

## 2012-08-04 DIAGNOSIS — F17200 Nicotine dependence, unspecified, uncomplicated: Secondary | ICD-10-CM | POA: Insufficient documentation

## 2012-08-04 DIAGNOSIS — R51 Headache: Secondary | ICD-10-CM | POA: Insufficient documentation

## 2012-08-04 DIAGNOSIS — R197 Diarrhea, unspecified: Secondary | ICD-10-CM | POA: Insufficient documentation

## 2012-08-04 DIAGNOSIS — Z79899 Other long term (current) drug therapy: Secondary | ICD-10-CM | POA: Insufficient documentation

## 2012-08-04 DIAGNOSIS — I10 Essential (primary) hypertension: Secondary | ICD-10-CM | POA: Insufficient documentation

## 2012-08-04 LAB — COMPREHENSIVE METABOLIC PANEL
ALT: 40 U/L — ABNORMAL HIGH (ref 0–35)
AST: 30 U/L (ref 0–37)
Albumin: 4.1 g/dL (ref 3.5–5.2)
Alkaline Phosphatase: 63 U/L (ref 39–117)
BUN: 3 mg/dL — ABNORMAL LOW (ref 6–23)
CO2: 22 mEq/L (ref 19–32)
Calcium: 10.2 mg/dL (ref 8.4–10.5)
Chloride: 105 mEq/L (ref 96–112)
Creatinine, Ser: 0.58 mg/dL (ref 0.50–1.10)
GFR calc Af Amer: >90 mL/min (ref >90)
GFR calc non Af Amer: >90 mL/min (ref >90)
Glucose, Bld: 124 mg/dL — ABNORMAL HIGH (ref 70–99)
Potassium: 3 mEq/L — ABNORMAL LOW (ref 3.5–5.1)
Sodium: 136 mEq/L (ref 135–145)
Total Bilirubin: 1 mg/dL (ref 0.3–1.2)
Total Protein: 8.6 g/dL — ABNORMAL HIGH (ref 6.0–8.3)

## 2012-08-04 LAB — URINALYSIS, ROUTINE W REFLEX MICROSCOPIC
Bilirubin Urine: NEGATIVE
Glucose, UA: NEGATIVE mg/dL
Hgb urine dipstick: NEGATIVE
Ketones, ur: NEGATIVE mg/dL
Leukocytes, UA: NEGATIVE
Nitrite: NEGATIVE
Protein, ur: NEGATIVE mg/dL
Specific Gravity, Urine: 1.02 (ref 1.005–1.030)
Urobilinogen, UA: 0.2 mg/dL (ref 0.0–1.0)
pH: 7 (ref 5.0–8.0)

## 2012-08-04 LAB — CBC WITH DIFFERENTIAL/PLATELET
Basophils Absolute: 0 10*3/uL (ref 0.0–0.1)
Basophils Relative: 0 % (ref 0–1)
Eosinophils Absolute: 0 10*3/uL (ref 0.0–0.7)
Eosinophils Relative: 0 % (ref 0–5)
HCT: 41.4 % (ref 36.0–46.0)
Hemoglobin: 14.7 g/dL (ref 12.0–15.0)
Lymphocytes Relative: 12 % (ref 12–46)
Lymphs Abs: 0.5 10*3/uL — ABNORMAL LOW (ref 0.7–4.0)
MCH: 31.8 pg (ref 26.0–34.0)
MCHC: 35.5 g/dL (ref 30.0–36.0)
MCV: 89.6 fL (ref 78.0–100.0)
Monocytes Absolute: 0.2 10*3/uL (ref 0.1–1.0)
Monocytes Relative: 4 % (ref 3–12)
Neutro Abs: 3.6 10*3/uL (ref 1.7–7.7)
Neutrophils Relative %: 84 % — ABNORMAL HIGH (ref 43–77)
Platelets: 162 10*3/uL (ref 150–400)
RBC: 4.62 MIL/uL (ref 3.87–5.11)
RDW: 13.4 % (ref 11.5–15.5)
WBC: 4.3 10*3/uL (ref 4.0–10.5)

## 2012-08-04 LAB — LIPASE, BLOOD: Lipase: 15 U/L (ref 11–59)

## 2012-08-04 MED ORDER — SODIUM CHLORIDE 0.9 % IV BOLUS (SEPSIS)
1000.0000 mL | Freq: Once | INTRAVENOUS | Status: AC
  Administered 2012-08-04: 1000 mL via INTRAVENOUS

## 2012-08-04 MED ORDER — PROMETHAZINE HCL 25 MG PO TABS: 25.0000 mg | ORAL_TABLET | Freq: Four times a day (QID) | ORAL | Status: DC | PRN

## 2012-08-04 MED ORDER — ONDANSETRON 8 MG PO TBDP: 8.0000 mg | ORAL_TABLET | Freq: Three times a day (TID) | ORAL | Status: DC | PRN

## 2012-08-04 MED ORDER — ONDANSETRON HCL 4 MG/2ML IJ SOLN
INTRAMUSCULAR | Status: AC
  Filled 2012-08-04: qty 2

## 2012-08-04 MED ORDER — POTASSIUM CHLORIDE CRYS ER 20 MEQ PO TBCR: 20.0000 meq | EXTENDED_RELEASE_TABLET | Freq: Two times a day (BID) | ORAL | Status: DC

## 2012-08-04 MED ORDER — ONDANSETRON HCL 4 MG/2ML IJ SOLN
4.0000 mg | Freq: Once | INTRAMUSCULAR | Status: AC
  Administered 2012-08-04: 4 mg via INTRAVENOUS

## 2012-08-04 MED ORDER — METOCLOPRAMIDE HCL 5 MG/ML IJ SOLN
10.0000 mg | Freq: Once | INTRAMUSCULAR | Status: AC
  Administered 2012-08-04: 10 mg via INTRAVENOUS
  Filled 2012-08-04: qty 2

## 2012-08-04 MED ORDER — FENTANYL CITRATE 0.05 MG/ML IJ SOLN
50.0000 ug | Freq: Once | INTRAMUSCULAR | Status: AC
  Administered 2012-08-04: 50 ug via INTRAVENOUS
  Filled 2012-08-04: qty 2

## 2012-08-04 MED ORDER — ONDANSETRON HCL 4 MG/2ML IJ SOLN
INTRAMUSCULAR | Status: AC
  Administered 2012-08-04: 4 mg via INTRAVENOUS
  Filled 2012-08-04: qty 2

## 2012-08-04 MED ORDER — PROMETHAZINE HCL 25 MG/ML IJ SOLN
25.0000 mg | Freq: Once | INTRAMUSCULAR | Status: AC
  Administered 2012-08-04: 25 mg via INTRAVENOUS
  Filled 2012-08-04: qty 1

## 2012-08-04 MED ORDER — POTASSIUM CHLORIDE 10 MEQ/100ML IV SOLN
10.0000 meq | INTRAVENOUS | Status: AC
  Administered 2012-08-04 (×5): 10 meq via INTRAVENOUS
  Filled 2012-08-04 (×4): qty 100

## 2012-08-04 MED ORDER — PROMETHAZINE HCL 25 MG/ML IJ SOLN
12.5000 mg | Freq: Once | INTRAMUSCULAR | Status: AC
  Administered 2012-08-04: 12.5 mg via INTRAVENOUS
  Filled 2012-08-04: qty 1

## 2012-08-04 MED ORDER — OXYCODONE-ACETAMINOPHEN 5-325 MG PO TABS
1.0000 | ORAL_TABLET | Freq: Once | ORAL | Status: AC
  Administered 2012-08-04: 1 via ORAL
  Filled 2012-08-04: qty 1

## 2012-08-04 MED ORDER — POTASSIUM CHLORIDE 20 MEQ PO PACK
40.0000 meq | PACK | Freq: Once | ORAL | Status: AC
  Administered 2012-08-04: 40 meq via ORAL
  Filled 2012-08-04: qty 2

## 2012-08-04 MED ORDER — ASPIRIN 81 MG PO CHEW: 324.0000 mg | CHEWABLE_TABLET | Freq: Once | ORAL | Status: DC

## 2012-08-04 NOTE — ED Notes (Signed)
Pt up to restroom with assistance, pt with steady gait.

## 2012-08-04 NOTE — ED Notes (Signed)
Called to pt room, pt c/o IV site burning. IV site without redness, edema or s/s of irritation. Explained to pt potassium may be irritating to veins, primary NS bolus increased. Pt stated PIV felt better. No needs voiced at this time.

## 2012-08-04 NOTE — ED Provider Notes (Signed)
History     CSN: [id number]  Arrival date & time 08/04/12  0434   First MD Initiated Contact with Patient 08/04/12 [phone]      Chief Complaint  Patient presents with  . Fever  . Emesis  . Diarrhea    (Consider location/radiation/quality/duration/timing/severity/associated sxs/prior treatment) HPI.... nausea, vomiting, diarrhea, headache since yesterday. Nothing makes symptoms better or worse. Severity is moderate. No stiff neck or neuro deficits, , dysuria, chest pain, shortness of breath  Past Medical History  Diagnosis Date  . Hypertension   . Fibroids     Past Surgical History  Procedure Date  . Knee surgery     left knee    Family History  Problem Relation Age of Onset  . Cancer      FH  . Asthma      FH    History  Substance Use Topics  . Smoking status: Current Every Day Smoker -- 0.5 packs/day    Types: Cigarettes  . Smokeless tobacco: Not on file  . Alcohol Use: Yes    OB History    Grav Para Term Preterm Abortions TAB SAB Ect Mult Living                  Review of Systems  All other systems reviewed and are negative.    Allergies  Penicillins  Home Medications   Current Outpatient Rx  Name  Route  Sig  Dispense  Refill  . VITAMIN B 12 PO   Oral   Take 1 tablet by mouth daily.         Marland Kitchen GABAPENTIN 100 MG PO CAPS   Oral   Take 1 capsule (100 mg total) by mouth 3 (three) times daily.   90 capsule   5   . HYDROCODONE-ACETAMINOPHEN 5-325 MG PO TABS   Oral   Take 1 tablet by mouth every 4 (four) hours as needed for pain. For pain   90 tablet   5   . HYDROCODONE-ACETAMINOPHEN 5-325 MG PO TABS      1 or 2 tabs PO q6 hours prn pain   20 tablet   0   . LISINOPRIL-HYDROCHLOROTHIAZIDE 20-25 MG PO TABS   Oral   Take 1 tablet by mouth daily.           Marland Kitchen METHOCARBAMOL 500 MG PO TABS   Oral   Take 2 tablets (1,000 mg total) by mouth 4 (four) times daily as needed (muscle spasm/pain).   25 tablet   0   . IBUPROFEN 800 MG PO  TABS   Oral   Take 1 tablet (800 mg total) by mouth every 8 (eight) hours as needed.   90 tablet   5     BP 173/80  Pulse 106  Temp 98.1 F (36.7 C) (Oral)  Resp 18  Ht 5\' 6"  (1.676 m)  Wt 123 lb (55.792 kg)  BMI 19.85 kg/m2  SpO2 97%  Physical Exam  Nursing note and vitals reviewed. Constitutional: She is oriented to person, place, and time. She appears well-developed and well-nourished.  HENT:  Head: Normocephalic and atraumatic.  Eyes: Conjunctivae normal and EOM are normal. Pupils are equal, round, and reactive to light.  Neck: Normal range of motion. Neck supple.  Cardiovascular: Normal rate, regular rhythm and normal heart sounds.   Pulmonary/Chest: Effort normal and breath sounds normal.  Abdominal: Soft. Bowel sounds are normal.  Musculoskeletal: Normal range of motion.  Neurological: She is alert and oriented to person,  place, and time.  Skin: Skin is warm and dry.  Psychiatric: She has a normal mood and affect.    ED Course  Procedures (including critical care time)  Labs Reviewed  CBC WITH DIFFERENTIAL - Abnormal; Notable for the following:    Neutrophils Relative 84 (*)     Lymphs Abs 0.5 (*)     All other components within normal limits  COMPREHENSIVE METABOLIC PANEL - Abnormal; Notable for the following:    Potassium 3.0 (*)     Glucose, Bld 124 (*)     BUN 3 (*)     Total Protein 8.6 (*)     ALT 40 (*)     All other components within normal limits  LIPASE, BLOOD  URINALYSIS, ROUTINE W REFLEX MICROSCOPIC   No results found.   No diagnosis found.    MDM  Will hydrate, check labs, antibiotics, potassium replacement        Donnetta Hutching, MD 08/04/12 [phone]

## 2012-08-04 NOTE — ED Notes (Signed)
Patient given urine cup for specimen.

## 2012-08-04 NOTE — ED Notes (Signed)
Pt c/o iv site burning at this time.  IV site noted to have mild swelling to same.  Will attempt to find another site.

## 2012-08-04 NOTE — ED Notes (Signed)
Fever, vomiting and diarrhea since yesterday, body aches and chills

## 2012-08-04 NOTE — ED Notes (Signed)
Pt vomited small amount 

## 2012-08-04 NOTE — ED Notes (Signed)
Pt c/o lower back pain.  Dr. Rosalia Hammers notified and pain med given.

## 2012-08-04 NOTE — ED Provider Notes (Signed)
Patient rehydrated with 2 liters ns, potassium repleted with 40 mg iv, no vomiting here, patient did require multiple antiemetics.  She 's remained hemodynamically stable and is taking po.  Patient and family advised.    Hilario Quarry, MD 08/04/12 1249

## 2012-08-04 NOTE — ED Notes (Signed)
Pt sipping on sprite 

## 2012-08-04 NOTE — ED Notes (Signed)
No further vomiting at this time.  Pt resting, eyes closed.

## 2012-08-04 NOTE — ED Notes (Signed)
Pt c/o burning with potassium, slowed infusion to 91ml/hr, pt tolerating well at this time.

## 2012-09-06 ENCOUNTER — Other Ambulatory Visit: Payer: Self-pay | Admitting: *Deleted

## 2012-09-06 DIAGNOSIS — M1710 Unilateral primary osteoarthritis, unspecified knee: Secondary | ICD-10-CM

## 2012-09-06 MED ORDER — HYDROCODONE-ACETAMINOPHEN 5-325 MG PO TABS: 1.0000 | ORAL_TABLET | ORAL | Status: DC | PRN

## 2012-09-18 ENCOUNTER — Other Ambulatory Visit: Payer: Self-pay | Admitting: *Deleted

## 2012-09-18 DIAGNOSIS — M1710 Unilateral primary osteoarthritis, unspecified knee: Secondary | ICD-10-CM

## 2012-09-18 MED ORDER — HYDROCODONE-ACETAMINOPHEN 5-325 MG PO TABS: 1.0000 | ORAL_TABLET | ORAL | Status: DC | PRN

## 2012-09-26 ENCOUNTER — Emergency Department (HOSPITAL_COMMUNITY): Payer: Medicaid Other

## 2012-09-26 ENCOUNTER — Emergency Department (HOSPITAL_COMMUNITY)
Admission: EM | Admit: 2012-09-26 | Discharge: 2012-09-26 | Disposition: A | Discharge status: Home / Self Care | Payer: Medicaid Other | Attending: Emergency Medicine | Admitting: Emergency Medicine

## 2012-09-26 ENCOUNTER — Encounter (HOSPITAL_COMMUNITY): Payer: Self-pay

## 2012-09-26 DIAGNOSIS — R112 Nausea with vomiting, unspecified: Secondary | ICD-10-CM | POA: Insufficient documentation

## 2012-09-26 DIAGNOSIS — K529 Noninfective gastroenteritis and colitis, unspecified: Secondary | ICD-10-CM

## 2012-09-26 DIAGNOSIS — K5289 Other specified noninfective gastroenteritis and colitis: Secondary | ICD-10-CM | POA: Insufficient documentation

## 2012-09-26 DIAGNOSIS — Z8739 Personal history of other diseases of the musculoskeletal system and connective tissue: Secondary | ICD-10-CM | POA: Insufficient documentation

## 2012-09-26 DIAGNOSIS — F17200 Nicotine dependence, unspecified, uncomplicated: Secondary | ICD-10-CM | POA: Insufficient documentation

## 2012-09-26 DIAGNOSIS — Z8742 Personal history of other diseases of the female genital tract: Secondary | ICD-10-CM | POA: Insufficient documentation

## 2012-09-26 DIAGNOSIS — R6883 Chills (without fever): Secondary | ICD-10-CM | POA: Insufficient documentation

## 2012-09-26 DIAGNOSIS — I10 Essential (primary) hypertension: Secondary | ICD-10-CM | POA: Insufficient documentation

## 2012-09-26 DIAGNOSIS — Z79899 Other long term (current) drug therapy: Secondary | ICD-10-CM | POA: Insufficient documentation

## 2012-09-26 DIAGNOSIS — R197 Diarrhea, unspecified: Secondary | ICD-10-CM | POA: Insufficient documentation

## 2012-09-26 LAB — COMPREHENSIVE METABOLIC PANEL
ALT: 24 U/L (ref 0–35)
AST: 25 U/L (ref 0–37)
Albumin: 4.7 g/dL (ref 3.5–5.2)
Alkaline Phosphatase: 87 U/L (ref 39–117)
BUN: 7 mg/dL (ref 6–23)
CO2: 24 mEq/L (ref 19–32)
Calcium: 11.6 mg/dL — ABNORMAL HIGH (ref 8.4–10.5)
Chloride: 98 mEq/L (ref 96–112)
Creatinine, Ser: 0.56 mg/dL (ref 0.50–1.10)
GFR calc Af Amer: >90 mL/min (ref >90)
GFR calc non Af Amer: >90 mL/min (ref >90)
Glucose, Bld: 126 mg/dL — ABNORMAL HIGH (ref 70–99)
Potassium: 3.3 mEq/L — ABNORMAL LOW (ref 3.5–5.1)
Sodium: 136 mEq/L (ref 135–145)
Total Bilirubin: 0.9 mg/dL (ref 0.3–1.2)
Total Protein: 10.1 g/dL — ABNORMAL HIGH (ref 6.0–8.3)

## 2012-09-26 LAB — URINALYSIS, ROUTINE W REFLEX MICROSCOPIC
Bilirubin Urine: NEGATIVE
Glucose, UA: NEGATIVE mg/dL
Ketones, ur: 15 mg/dL — AB
Leukocytes, UA: NEGATIVE
Nitrite: NEGATIVE
Protein, ur: 30 mg/dL — AB
Specific Gravity, Urine: 1.025 (ref 1.005–1.030)
Urobilinogen, UA: 0.2 mg/dL (ref 0.0–1.0)
pH: 6 (ref 5.0–8.0)

## 2012-09-26 LAB — CBC WITH DIFFERENTIAL/PLATELET
Basophils Absolute: 0 10*3/uL (ref 0.0–0.1)
Basophils Relative: 0 % (ref 0–1)
Eosinophils Absolute: 0 10*3/uL (ref 0.0–0.7)
Eosinophils Relative: 0 % (ref 0–5)
HCT: 48 % — ABNORMAL HIGH (ref 36.0–46.0)
Hemoglobin: 16.8 g/dL — ABNORMAL HIGH (ref 12.0–15.0)
Lymphocytes Relative: 9 % — ABNORMAL LOW (ref 12–46)
Lymphs Abs: 0.5 10*3/uL — ABNORMAL LOW (ref 0.7–4.0)
MCH: 30.6 pg (ref 26.0–34.0)
MCHC: 35 g/dL (ref 30.0–36.0)
MCV: 87.4 fL (ref 78.0–100.0)
Monocytes Absolute: 0.2 10*3/uL (ref 0.1–1.0)
Monocytes Relative: 3 % (ref 3–12)
Neutro Abs: 5.1 10*3/uL (ref 1.7–7.7)
Neutrophils Relative %: 88 % — ABNORMAL HIGH (ref 43–77)
Platelets: 143 10*3/uL — ABNORMAL LOW (ref 150–400)
RBC: 5.49 MIL/uL — ABNORMAL HIGH (ref 3.87–5.11)
RDW: 13.4 % (ref 11.5–15.5)
WBC: 5.8 10*3/uL (ref 4.0–10.5)

## 2012-09-26 LAB — URINE MICROSCOPIC-ADD ON

## 2012-09-26 LAB — LACTIC ACID, PLASMA: Lactic Acid, Venous: 1.9 mmol/L (ref 0.5–2.2)

## 2012-09-26 LAB — LIPASE, BLOOD: Lipase: 22 U/L (ref 11–59)

## 2012-09-26 MED ORDER — ONDANSETRON HCL 4 MG PO TABS: 4.0000 mg | ORAL_TABLET | Freq: Four times a day (QID) | ORAL | Status: DC

## 2012-09-26 MED ORDER — MORPHINE SULFATE 4 MG/ML IJ SOLN
4.0000 mg | Freq: Once | INTRAMUSCULAR | Status: DC
  Filled 2012-09-26: qty 1

## 2012-09-26 MED ORDER — ACETAMINOPHEN 325 MG PO TABS
650.0000 mg | ORAL_TABLET | Freq: Once | ORAL | Status: AC
  Administered 2012-09-26: 650 mg via ORAL
  Filled 2012-09-26: qty 2

## 2012-09-26 MED ORDER — ONDANSETRON HCL 4 MG/2ML IJ SOLN
4.0000 mg | Freq: Once | INTRAMUSCULAR | Status: AC
  Administered 2012-09-26: 4 mg via INTRAMUSCULAR

## 2012-09-26 MED ORDER — ONDANSETRON HCL 4 MG/2ML IJ SOLN
4.0000 mg | Freq: Once | INTRAMUSCULAR | Status: DC
  Filled 2012-09-26: qty 2

## 2012-09-26 MED ORDER — MORPHINE SULFATE 4 MG/ML IJ SOLN
4.0000 mg | Freq: Once | INTRAMUSCULAR | Status: AC
  Administered 2012-09-26: 4 mg via INTRAMUSCULAR

## 2012-09-26 MED ORDER — SODIUM CHLORIDE 0.9 % IV BOLUS (SEPSIS): 1000.0000 mL | Freq: Once | INTRAVENOUS | Status: DC

## 2012-09-26 MED ORDER — POTASSIUM CHLORIDE CRYS ER 20 MEQ PO TBCR
40.0000 meq | EXTENDED_RELEASE_TABLET | Freq: Once | ORAL | Status: AC
  Administered 2012-09-26: 40 meq via ORAL
  Filled 2012-09-26: qty 2

## 2012-09-26 NOTE — ED Provider Notes (Signed)
History     This chart was scribed for Glynn Octave, MD, MD by Smitty Pluck, ED Scribe. The patient was seen in room APA17/[facility]17 and the patient's care was started at 1:00 PM.   CSN: [id number]  Arrival date & time 09/26/12  1209      Chief Complaint  Patient presents with  . Abdominal Pain  . Emesis  . Diarrhea    The history is provided by the patient. No language interpreter was used.   Colleen Lee is a 57 y.o. female who presents to the Emergency Department complaining of constant, moderate nausea and emesis onset 1 day ago. She reports she has vomited multiple times since onset. Pt is unsure if she had hematemesis. She states she has chills and diarrhea onset today. She states she has cramping abdominal pain onset 1 day ago. Reports hx of knee pain and fibroid. Pt denies blood in stool, sore throat, fever,  weakness, cough, SOB and any other pain.   OB/GYN Dr Emelda Fear   Past Medical History  Diagnosis Date  . Hypertension   . Fibroids     Past Surgical History  Procedure Laterality Date  . Knee surgery      left knee    Family History  Problem Relation Age of Onset  . Cancer      FH  . Asthma      FH    History  Substance Use Topics  . Smoking status: Current Every Day Smoker -- 0.50 packs/day    Types: Cigarettes  . Smokeless tobacco: Not on file  . Alcohol Use: Yes     Comment: former, no etoh since jan    OB History   Grav Para Term Preterm Abortions TAB SAB Ect Mult Living                  Review of Systems 10 Systems reviewed and all are negative for acute change except as noted in the HPI.   Allergies  Penicillins  Home Medications   Current Outpatient Rx  Name  Route  Sig  Dispense  Refill  . Cyanocobalamin (VITAMIN B 12 PO)   Oral   Take 1 tablet by mouth daily.         Marland Kitchen gabapentin (NEURONTIN) 100 MG capsule   Oral   Take 1 capsule (100 mg total) by mouth 3 (three) times daily.   90 capsule   5   . ondansetron  (ZOFRAN ODT) 8 MG disintegrating tablet   Oral   Take 1 tablet (8 mg total) by mouth every 8 (eight) hours as needed for nausea.   20 tablet   0   . promethazine (PHENERGAN) 25 MG tablet   Oral   Take 1 tablet (25 mg total) by mouth every 6 (six) hours as needed for nausea.   30 tablet   0   . HYDROcodone-acetaminophen (NORCO/VICODIN) 5-325 MG per tablet   Oral   Take 1 tablet by mouth every 4 (four) hours as needed for pain. For pain   40 tablet   5   . ibuprofen (ADVIL,MOTRIN) 800 MG tablet   Oral   Take 1 tablet (800 mg total) by mouth every 8 (eight) hours as needed.   90 tablet   5   . lisinopril-hydrochlorothiazide (PRINZIDE,ZESTORETIC) 20-25 MG per tablet   Oral   Take 1 tablet by mouth daily.           . methocarbamol (ROBAXIN) 500 MG tablet  Oral   Take 2 tablets (1,000 mg total) by mouth 4 (four) times daily as needed (muscle spasm/pain).   25 tablet   0   . ondansetron (ZOFRAN) 4 MG tablet   Oral   Take 1 tablet (4 mg total) by mouth every 6 (six) hours.   12 tablet   0   . potassium chloride SA (K-DUR,KLOR-CON) 20 MEQ tablet   Oral   Take 1 tablet (20 mEq total) by mouth 2 (two) times daily.   10 tablet   0     BP 176/97  Pulse 73  Temp(Src) 98 F (36.7 C) (Oral)  Resp 27  Ht 5\' 6"  (1.676 m)  Wt 128 lb (58.06 kg)  BMI 20.67 kg/m2  SpO2 100%  Physical Exam  Nursing note and vitals reviewed. Constitutional: She is oriented to person, place, and time. She appears well-developed and well-nourished. No distress.  HENT:  Head: Normocephalic and atraumatic.  Eyes: EOM are normal. Pupils are equal, round, and reactive to light.  Neck: Normal range of motion. Neck supple. No tracheal deviation present.  Cardiovascular: Normal rate, regular rhythm and normal heart sounds.   Pulmonary/Chest: Effort normal. No respiratory distress.  Abdominal: Soft. She exhibits no distension. There is tenderness (diffuse). There is no CVA tenderness.  No  peritoneal signs   Musculoskeletal: Normal range of motion.  Neurological: She is alert and oriented to person, place, and time.  Skin: Skin is warm and dry.  Psychiatric: She has a normal mood and affect. Her behavior is normal.    ED Course  Procedures (including critical care time) DIAGNOSTIC STUDIES: Oxygen Saturation is 100% on room air, normal by my interpretation.    COORDINATION OF CARE: 1:02 PM Discussed ED treatment with pt and pt agrees.  1:07 PM Ordered:  Medications  sodium chloride 0.9 % bolus 1,000 mL (1,000 mLs Intravenous Not Given 09/26/12 1650)  morphine 4 MG/ML injection 4 mg (4 mg Intramuscular Given 09/26/12 1357)  ondansetron (ZOFRAN) injection 4 mg (4 mg Intramuscular Given 09/26/12 1357)  potassium chloride SA (K-DUR,KLOR-CON) CR tablet 40 mEq (40 mEq Oral Given 09/26/12 1649)  acetaminophen (TYLENOL) tablet 650 mg (650 mg Oral Given 09/26/12 1648)       Labs Reviewed  CBC WITH DIFFERENTIAL - Abnormal; Notable for the following:    RBC 5.49 (*)    Hemoglobin 16.8 (*)    HCT 48.0 (*)    Platelets 143 (*)    Neutrophils Relative 88 (*)    Lymphocytes Relative 9 (*)    Lymphs Abs 0.5 (*)    All other components within normal limits  COMPREHENSIVE METABOLIC PANEL - Abnormal; Notable for the following:    Potassium 3.3 (*)    Glucose, Bld 126 (*)    Calcium 11.6 (*)    Total Protein 10.1 (*)    All other components within normal limits  LIPASE, BLOOD  LACTIC ACID, PLASMA  URINALYSIS, ROUTINE W REFLEX MICROSCOPIC   Dg Abd Acute W/chest  09/26/2012  *RADIOLOGY REPORT*  Clinical Data: Abdominal pain, diarrhea  ACUTE ABDOMEN SERIES (ABDOMEN 2 VIEW & CHEST 1 VIEW)  Comparison: CT 07/17/2012  Findings: Normal cardiac silhouette.  Lungs are clear.  No free air beneath hemidiaphragms.  Prominent nipple shadows.  No dilated loops of large or small bowel.  There is gas in the rectum.  Calcified leiomyomas noted in the pelvis.  IMPRESSION:  1. No acute  cardiopulmonary process. 2.  No evidence of bowel obstruction or intraperitoneal  free air.   Original Report Authenticated By: Genevive Bi, M.D.      1. Gastroenteritis       MDM  One day of nausea, vomiting, diarrhea diffuse abdominal pain. Denies sick contacts. Denies fever, chills, urinary symptoms or vaginal symptoms.  Abdomen soft without peritoneal signs.  She is given IV fluids, antiemetics. No vomiting in the ED. Orthostatics negative. BP high.  Suspect viral gastroenteritis. Antiemetics and PO hydration at home. Return precautions discussed.  I personally performed the services described in this documentation, which was scribed in my presence. The recorded information has been reviewed and is accurate.    Glynn Octave, MD 09/26/12 1730

## 2012-09-26 NOTE — ED Notes (Signed)
Left in c/o spouse for transport home; a&ox4; answers questions appropriately; instructions, prescriptions and f/u information given/reviewed - verbalizes understanding.

## 2012-09-26 NOTE — ED Notes (Signed)
Pt c/o n/v/d and abd pain since yesterday. 

## 2012-09-28 ENCOUNTER — Ambulatory Visit (INDEPENDENT_AMBULATORY_CARE_PROVIDER_SITE_OTHER): Payer: Medicaid Other | Admitting: Orthopedic Surgery

## 2012-09-28 ENCOUNTER — Encounter: Payer: Self-pay | Admitting: Orthopedic Surgery

## 2012-09-28 ENCOUNTER — Ambulatory Visit (INDEPENDENT_AMBULATORY_CARE_PROVIDER_SITE_OTHER): Payer: Medicaid Other

## 2012-09-28 VITALS — BP 120/84 | Ht 66.0 in | Wt 120.0 lb

## 2012-09-28 DIAGNOSIS — IMO0002 Reserved for concepts with insufficient information to code with codable children: Secondary | ICD-10-CM

## 2012-09-28 DIAGNOSIS — M549 Dorsalgia, unspecified: Secondary | ICD-10-CM

## 2012-09-28 DIAGNOSIS — R112 Nausea with vomiting, unspecified: Secondary | ICD-10-CM

## 2012-09-28 DIAGNOSIS — M5410 Radiculopathy, site unspecified: Secondary | ICD-10-CM | POA: Insufficient documentation

## 2012-09-28 MED ORDER — PREDNISONE 10 MG PO KIT: 10.0000 mg | PACK | ORAL | Status: DC

## 2012-09-28 MED ORDER — HYDROCODONE-ACETAMINOPHEN 10-325 MG PO TABS: 1.0000 | ORAL_TABLET | ORAL | Status: DC | PRN

## 2012-09-28 MED ORDER — ONDANSETRON 8 MG PO TBDP: 8.0000 mg | ORAL_TABLET | Freq: Three times a day (TID) | ORAL | Status: DC | PRN

## 2012-09-28 NOTE — Progress Notes (Signed)
Patient ID: Colleen Lee, female   DOB: 1955/10/15, 57 y.o.   MRN: [id number] Chief Complaint  Patient presents with  . Follow-up    Left knee pain.   This patient to assess as a referral from a nurse practitioner with chronic knee pain status post knee arthroscopy left knee but she said she complaining of left leg pain radiating from her hip to her knee. She says she can't stand up to wash dishes. She is the pain is severe 10 out of 10. It's an aching burning sensation. It's constant it's worse at night is worse with standing or sitting. C/O numb left foot as well.  She denies weight loss fever chills double vision headache chest pain shortness of breath GI discomfort. No skin abnormalities.  BP 120/84  Ht 5\' 6"  (1.676 m)  Wt 120 lb (54.432 kg)  BMI 19.38 kg/m2 General appearance she small frame low BMI. She is oriented x3 her mood is depressed her affect is depressed her ambulation is noted for significant slowed gait abnormal posture and a limp

## 2012-09-28 NOTE — Patient Instructions (Addendum)
You have been scheduled for an MRI scan.    You have new medications   Prednisone dose pack  Norco 10   And increase gabapentin to 300 mg 3 times a day

## 2012-10-05 ENCOUNTER — Telehealth: Payer: Self-pay | Admitting: Radiology

## 2012-10-05 NOTE — Telephone Encounter (Signed)
Patient has MRI at [facility] on 10-10-12 at 11:45. Patient has Medicaid, authorization # [id number] and it expires on 11-04-12. Patient will follow back up for results in our office.

## 2012-10-09 ENCOUNTER — Emergency Department (HOSPITAL_COMMUNITY)
Admission: EM | Admit: 2012-10-09 | Discharge: 2012-10-09 | Discharge status: Against Medical Advice | Payer: Medicaid Other | Attending: Emergency Medicine | Admitting: Emergency Medicine

## 2012-10-09 ENCOUNTER — Encounter (HOSPITAL_COMMUNITY): Payer: Self-pay | Admitting: *Deleted

## 2012-10-09 DIAGNOSIS — M7989 Other specified soft tissue disorders: Secondary | ICD-10-CM | POA: Insufficient documentation

## 2012-10-09 NOTE — ED Notes (Signed)
No answer when called 

## 2012-10-09 NOTE — ED Notes (Signed)
Swelling of both legs,for 2 days,Tingling sensation rt arm at times.

## 2012-10-09 NOTE — ED Notes (Signed)
Unable to locate

## 2012-10-10 ENCOUNTER — Ambulatory Visit (HOSPITAL_COMMUNITY)
Admission: RE | Admit: 2012-10-10 | Discharge: 2012-10-10 | Disposition: A | Discharge status: Home / Self Care | Payer: Medicaid Other | Source: Ambulatory Visit | Attending: Orthopedic Surgery | Admitting: Orthopedic Surgery

## 2012-10-10 DIAGNOSIS — M5126 Other intervertebral disc displacement, lumbar region: Secondary | ICD-10-CM | POA: Insufficient documentation

## 2012-10-10 DIAGNOSIS — M545 Low back pain, unspecified: Secondary | ICD-10-CM | POA: Insufficient documentation

## 2012-10-10 DIAGNOSIS — M549 Dorsalgia, unspecified: Secondary | ICD-10-CM

## 2012-10-10 DIAGNOSIS — M79609 Pain in unspecified limb: Secondary | ICD-10-CM | POA: Insufficient documentation

## 2012-10-16 ENCOUNTER — Ambulatory Visit: Payer: Medicaid Other | Admitting: Orthopedic Surgery

## 2012-10-18 ENCOUNTER — Ambulatory Visit (INDEPENDENT_AMBULATORY_CARE_PROVIDER_SITE_OTHER): Payer: Medicaid Other | Admitting: Orthopedic Surgery

## 2012-10-18 ENCOUNTER — Encounter: Payer: Self-pay | Admitting: Orthopedic Surgery

## 2012-10-18 VITALS — BP 136/80 | Ht 66.0 in | Wt 120.0 lb

## 2012-10-18 DIAGNOSIS — M549 Dorsalgia, unspecified: Secondary | ICD-10-CM

## 2012-10-18 MED ORDER — METHOCARBAMOL 500 MG PO TABS: 500.0000 mg | ORAL_TABLET | Freq: Three times a day (TID) | ORAL | Status: DC

## 2012-10-18 NOTE — Progress Notes (Signed)
Patient ID: Colleen Lee, female   DOB: 11-03-1955, 57 y.o.   MRN: [id number] Chief Complaint  Patient presents with  . Follow-up    MRI results of her L-spine.    The patient's MRI shows that she has some mild bulging discs and some facet arthrosis.  She is under the care of the gastroenterologist and is scheduled for evaluation for possible upper or lower GI,.  She's had some appetite issues,  She's had her mammogram and her Pap smears through the health department.  She complains of muscle spasms in the LEFT side of her lower back and bilateral leg edema.  MRI report reviewed.  Degenerative disc disease.  Prescription for Robaxin. Symptoms have not improved or worsened.  Try Robaxin return in one month to 6 weeks for reevaluation

## 2012-10-23 ENCOUNTER — Encounter: Payer: Self-pay | Admitting: Internal Medicine

## 2012-10-25 ENCOUNTER — Encounter: Payer: Self-pay | Admitting: Gastroenterology

## 2012-10-25 ENCOUNTER — Ambulatory Visit (INDEPENDENT_AMBULATORY_CARE_PROVIDER_SITE_OTHER): Payer: Medicaid Other | Admitting: Gastroenterology

## 2012-10-25 VITALS — BP 129/76 | HR 70 | Temp 97.4°F | Ht 66.0 in | Wt 126.2 lb

## 2012-10-25 DIAGNOSIS — R634 Abnormal weight loss: Secondary | ICD-10-CM | POA: Insufficient documentation

## 2012-10-25 DIAGNOSIS — K5900 Constipation, unspecified: Secondary | ICD-10-CM

## 2012-10-25 DIAGNOSIS — R1013 Epigastric pain: Secondary | ICD-10-CM

## 2012-10-25 DIAGNOSIS — B182 Chronic viral hepatitis C: Secondary | ICD-10-CM

## 2012-10-25 DIAGNOSIS — R198 Other specified symptoms and signs involving the digestive system and abdomen: Secondary | ICD-10-CM

## 2012-10-25 DIAGNOSIS — R140 Abdominal distension (gaseous): Secondary | ICD-10-CM | POA: Insufficient documentation

## 2012-10-25 DIAGNOSIS — R6881 Early satiety: Secondary | ICD-10-CM | POA: Insufficient documentation

## 2012-10-25 DIAGNOSIS — R194 Change in bowel habit: Secondary | ICD-10-CM | POA: Insufficient documentation

## 2012-10-25 DIAGNOSIS — R141 Gas pain: Secondary | ICD-10-CM

## 2012-10-25 MED ORDER — OMEPRAZOLE 20 MG PO CPDR: 20.0000 mg | DELAYED_RELEASE_CAPSULE | Freq: Every day | ORAL | Status: DC

## 2012-10-25 NOTE — Assessment & Plan Note (Signed)
Chronic epigastric pain, early satiety, anorexia. Chronic ibuprofen use. Differential diagnosis includes peptic ulcer disease, malignancy, complicated GERD, pancreatic abnormality. CT shows mild prominence of the pancreatic duct appears to be stable at least. Last contrasted CT study was in August of last year no signs of chronic pancreatitis. We'll check labs including CMET, lipase, CBC. EGD in the near future. Augment conscious sedation with Phenergan 25 mg IV 30 minutes before the procedure.  I have discussed the risks, alternatives, benefits with regards to but not limited to the risk of reaction to medication, bleeding, infection, perforation and the patient is agreeable to proceed. Written consent to be obtained.  Begin omeprazole 20 mg daily. Prescription provided.

## 2012-10-25 NOTE — Patient Instructions (Signed)
Please start omeprazole 1 capsule 30 minutes before breakfast for your abdominal pain and heartburn. Please have your blood work. We have scheduled you for an upper endoscopy and colonoscopy with Dr. Jena Gauss. Please see separate instructions.

## 2012-10-25 NOTE — Assessment & Plan Note (Signed)
Abnormal weight loss, 50 pounds noted since last seen in September 2009. Patient reports gradual weight loss over the last couple of years. We'll check thyroid status. Other labs an EGD/colonoscopy is planned.

## 2012-10-25 NOTE — Assessment & Plan Note (Signed)
Patient unclear whether she was ever seen at the hepatitis clinic for chronic [name] is referred back in 2009. She reports being told that she no longer had [name]. Because her history is sketchy, we will recheck HCV RNA, also will check hepatitis A and B Immune status. Discussed possibility check an HIV given patient's prior drug history and exposures, weight loss but patient not interested at this time stating that she had negative HIV test 7-8 years ago and was no longer sexually active.

## 2012-10-25 NOTE — Progress Notes (Signed)
Primary Care Physician:  HOWARD, KEVIN, MD  Primary Gastroenterologist:  Michael Rourk, MD   Chief Complaint  Patient presents with  . Gastrophageal Reflux  . Abdominal Pain    HPI:  Colleen Lee is a 57 y.o. female here for further evaluation of abdominal pain and acid reflux. She was last seen back in 2009. She has a history of positive HCV RNA and we've referred to [name] clinic at that time but patient does not recall going. She states that someone told her [name] have resolved. She also was treated for H. Pylori at that time. EGD and colonoscopy showed erosive duodenitis, hyperplastic sigmoid polyp. She has a history of remote illicit drug use. History of prior daily alcohol consumption one beer daily. She has had no alcohol in over 2 months stating she gave it up for her new years resolution.  Over the past one year she has complained of abdominal pain, change in bowels, GERD. Currently taking Epson salts to help with bowels. Tried "everything else" at the drug store and reports nothing helps. Complains of bloating of the abdomen all the way up under the ribs. This gets better with good bowel movements. Used to have regular bowel movements. No melena, brbpr. Some intermittent vomiting but none in 6 weeks. Taking Zantac for heartburn, helps some. Early satiety. Lack of appetite. Weight down 50 pounds since 04/2008. Some weight gain on Megace now. Was going a week without eating and only consuming Ensure.   CT of the abdomen and pelvis without contrast in December 2013 showed mildly dilated pancreatic duct within the pancreatic head and neck measuring 4 mm and unchanged compared to CT in August of 2013. (this was a contrasted study in August 2013). Pancreatic duct prominence in August 2013 reported to be similar to those studies back in January 2011 and September 2009  Ibuprofen regularly. BM twice a day right now.    Current Outpatient Prescriptions  Medication Sig Dispense  Refill  . Cyanocobalamin (VITAMIN B 12 PO) Take 1 tablet by mouth daily.      . gabapentin (NEURONTIN) 100 MG capsule Take 1 capsule (100 mg total) by mouth 3 (three) times daily.  90 capsule  5  . HYDROcodone-acetaminophen (NORCO) 10-325 MG per tablet Take 1 tablet by mouth every 4 (four) hours as needed for pain.  42 tablet  3  . ibuprofen (ADVIL,MOTRIN) 800 MG tablet Take 1 tablet (800 mg total) by mouth every 8 (eight) hours as needed.  90 tablet  5  . lisinopril-hydrochlorothiazide (PRINZIDE,ZESTORETIC) 20-25 MG per tablet Take 1 tablet by mouth daily.        . megestrol (MEGACE) 40 MG/ML suspension Take 200 mg by mouth daily.      . methocarbamol (ROBAXIN) 500 MG tablet Take 1 tablet (500 mg total) by mouth 3 (three) times daily.  60 tablet  1  . ondansetron (ZOFRAN ODT) 8 MG disintegrating tablet Take 1 tablet (8 mg total) by mouth every 8 (eight) hours as needed for nausea.  60 tablet  0  . ondansetron (ZOFRAN) 4 MG tablet Take 1 tablet (4 mg total) by mouth every 6 (six) hours.  12 tablet  0  . potassium chloride SA (K-DUR,KLOR-CON) 20 MEQ tablet Take 1 tablet (20 mEq total) by mouth 2 (two) times daily.  10 tablet  0  . PredniSONE 10 MG KIT Take 1 kit (10 mg total) by mouth as directed.  48 each  0  . promethazine (PHENERGAN) 25   MG tablet Take 1 tablet (25 mg total) by mouth every 6 (six) hours as needed for nausea.  30 tablet  0   No current facility-administered medications for this visit.    Allergies as of 10/25/2012 - Review Complete 10/25/2012  Allergen Reaction Noted  . Penicillins Rash     Past Medical History  Diagnosis Date  . Hypertension   . Fibroids   . [name]     HCV RNA positive July 2009    Past Surgical History  Procedure Laterality Date  . Knee surgery      left knee  . Colonoscopy  01/18/2008    RMR:normal rectum.  Long redundant colon, a diminutive sigmoid polyp status post cold biopsy removed. Hyperplastic polyp. Repeat colonoscopy June 2014 due  to family history of colon cancer  . Esophagogastroduodenoscopy  01/18/2008    RMR: Normal esophagus, normal  stomach    Family History  Problem Relation Age of Onset  . Colon cancer Brother        . Asthma      FH  . Multiple myeloma Brother   . Liver cancer Sister   . Prostate cancer Brother   . Pancreatic cancer Brother   . Cancer Other     niece, age 19, primary unknown    History   Social History  . Marital Status: Married    Spouse Name: N/A    Number of Children: 0  . Years of Education: N/A   Occupational History  . umemployed     Social History Main Topics  . Smoking status: Current Every Day Smoker -- 0.50 packs/day    Types: Cigarettes  . Smokeless tobacco: Not on file  . Alcohol Use: Yes     Comment: former, no etoh since jan 2014  . Drug Use: No     Comment: remote in 1970s  . Sexually Active: Not on file   Other Topics Concern  . Not on file   Social History Narrative  . No narrative on file      ROS:  General: positive for anorexia, weight loss. Negative for  fever, chills, fatigue, weakness. Eyes: Negative for vision changes.  ENT: Negative for hoarseness, difficulty swallowing, nasal congestion. CV: Negative for chest pain, angina, palpitations, dyspnea on exertion, peripheral edema.  Respiratory: Negative for dyspnea at rest, dyspnea on exertion, cough, sputum, wheezing.  GI: See history of present illness. GU:  Negative for dysuria, hematuria, urinary incontinence, urinary frequency, nocturnal urination.  MS: Negative for joint pain. Positive for low back pain, recent MRI of the back.  Derm: Negative for rash or itching.  Neuro: Negative for weakness, abnormal sensation, seizure, frequent headaches, memory loss, confusion.  Psych: Negative for anxiety, depression, suicidal ideation, hallucinations.  Endo:see history of present illness  Heme: Negative for bruising or bleeding. Allergy: Negative for rash or hives.    Physical  Examination:  BP 129/76  Pulse 70  Temp(Src) 97.4 F (36.3 C) (Oral)  Ht 5' 6" (1.676 m)  Wt 126 lb 3.2 oz (57.244 kg)  BMI 20.38 kg/m2   General: Well-nourished, well-developed in no acute distress.  Head: Normocephalic, atraumatic.   Eyes: Conjunctiva pink, no icterus. Mouth: Oropharyngeal mucosa moist and pink , no lesions erythema or exudate. Neck: Supple without thyromegaly, masses, or lymphadenopathy.  Lungs: Clear to auscultation bilaterally.  Heart: Regular rate and rhythm, no murmurs rubs or gallops.  Abdomen: Bowel sounds are normal, moderate epigastric tenderness, nondistended, no hepatosplenomegaly or masses, no abdominal   bruits or    hernia , no rebound or guarding.   Rectal: not performed Extremities: No lower extremity edema. No clubbing or deformities.  Neuro: Alert and oriented x 4 , grossly normal neurologically.  Skin: Warm and dry, no rash or jaundice.   Psych: Alert and cooperative, normal mood and affect.  Labs: Lab Results  Component Value Date   WBC 5.8 09/26/2012   HGB 16.8* 09/26/2012   HCT 48.0* 09/26/2012   MCV 87.4 09/26/2012   PLT 143* 09/26/2012   Lab Results  Component Value Date   CREATININE 0.56 09/26/2012   BUN 7 09/26/2012   NA 136 09/26/2012   K 3.3* 09/26/2012   CL 98 09/26/2012   CO2 24 09/26/2012   Lab Results  Component Value Date   ALT 24 09/26/2012   AST 25 09/26/2012   ALKPHOS 87 09/26/2012   BILITOT 0.9 09/26/2012   Lab Results  Component Value Date   LIPASE 22 09/26/2012     Imaging Studies: Dg Lumbar Spine 2-3 Views  10/03/2012  Radiology report 3 views lumbar spine  Patient complains of radicular leg pain  There is a coronal plane malalignment of the lumbar spine on the spot  films the disc spaces appear to be well-preserved  Impression this appears to be reactive coronal plane malalignment in the  lumbar spine without degenerative disc disease, there appears to be  calcification of the aorta are or iliac vessels near the lower  lumbar  area.   Mr Lumbar Spine Wo Contrast  10/10/2012  *RADIOLOGY REPORT*  Clinical Data: Low back pain radiating to both legs.  MRI LUMBAR SPINE WITHOUT CONTRAST  Technique:  Multiplanar and multiecho pulse sequences of the lumbar spine were obtained without intravenous contrast.  Comparison: Two views lumbar spine 09/28/2012.  CT abdomen and pelvis 03/23/2012.  Findings: The patient was unable to complete the exam and no axial T1-weighted sequence is provided.  Marrow signal is somewhat decreased diffusely but remains brighter than disc.  No focal marrow lesion is identified.  Mild convex right curvature is noted. Vertebral body height is maintained.  The conus medullaris is normal in signal and position.  The T11-12 level is imaged in the sagittal plane only and negative.  T12-L1:  Negative.  L1-2:  Negative.  L2-3:  Minimal disc bulge without central canal or foraminal stenosis.  L3-4:  Minimal disc bulge without central canal or foraminal narrowing.  L4-5:  Negative.  L5-S1:  Mild facet arthropathy and a shallow disc bulge.  Central canal and foramina widely patent.  IMPRESSION:  1.  Mild degenerative disc disease without central canal or foraminal stenosis. 2.  Decreased marrow signal is nonspecific but could be due to cigarette smoking.  No focal marrow lesion is identified.   Original Report Authenticated By: Thomas D'Alessio, M.D.    Dg Abd Acute W/chest  09/26/2012  *RADIOLOGY REPORT*  Clinical Data: Abdominal pain, diarrhea  ACUTE ABDOMEN SERIES (ABDOMEN 2 VIEW & CHEST 1 VIEW)  Comparison: CT 07/17/2012  Findings: Normal cardiac silhouette.  Lungs are clear.  No free air beneath hemidiaphragms.  Prominent nipple shadows.  No dilated loops of large or small bowel.  There is gas in the rectum.  Calcified leiomyomas noted in the pelvis.  IMPRESSION:  1. No acute cardiopulmonary process. 2.  No evidence of bowel obstruction or intraperitoneal free air.   Original Report Authenticated By: Stewart  Edmunds, M.D.       

## 2012-10-25 NOTE — Assessment & Plan Note (Signed)
Abdominal bloating associated with constipation. Progressive over the last one year. May be due to diminished solid food intake, although bowel movements have been more regular the last few weeks. Colonoscopy in the near future for change in bowels, family history of colon cancer. Augment conscious sedation with Phenergan 25 mg IV 30 minutes prior to procedure.  I have discussed the risks, alternatives, benefits with regards to but not limited to the risk of reaction to medication, bleeding, infection, perforation and the patient is agreeable to proceed. Written consent to be obtained.

## 2012-10-26 ENCOUNTER — Encounter (HOSPITAL_COMMUNITY): Payer: Self-pay | Admitting: Pharmacy Technician

## 2012-10-27 LAB — COMPREHENSIVE METABOLIC PANEL
ALT: 28 U/L (ref 0–35)
AST: 24 U/L (ref 0–37)
Albumin: 4.1 g/dL (ref 3.5–5.2)
Alkaline Phosphatase: 64 U/L (ref 39–117)
BUN: 7 mg/dL (ref 6–23)
CO2: 19 mEq/L (ref 19–32)
Calcium: 9.9 mg/dL (ref 8.4–10.5)
Chloride: 108 mEq/L (ref 96–112)
Creat: 0.76 mg/dL (ref 0.50–1.10)
Glucose, Bld: 97 mg/dL (ref 70–99)
Potassium: 4.2 mEq/L (ref 3.5–5.3)
Sodium: 135 mEq/L (ref 135–145)
Total Bilirubin: 0.6 mg/dL (ref 0.3–1.2)
Total Protein: 7.5 g/dL (ref 6.0–8.3)

## 2012-10-27 LAB — T4, FREE: Free T4: 0.9 ng/dL (ref 0.80–1.80)

## 2012-10-27 LAB — CBC WITH DIFFERENTIAL/PLATELET
Basophils Absolute: 0 10*3/uL (ref 0.0–0.1)
Basophils Relative: 0 % (ref 0–1)
Eosinophils Absolute: 0.1 10*3/uL (ref 0.0–0.7)
Eosinophils Relative: 2 % (ref 0–5)
HCT: 42.6 % (ref 36.0–46.0)
Hemoglobin: 14.3 g/dL (ref 12.0–15.0)
Lymphocytes Relative: 27 % (ref 12–46)
Lymphs Abs: 1.5 10*3/uL (ref 0.7–4.0)
MCH: 31 pg (ref 26.0–34.0)
MCHC: 33.6 g/dL (ref 30.0–36.0)
MCV: 92.4 fL (ref 78.0–100.0)
Monocytes Absolute: 0.5 10*3/uL (ref 0.1–1.0)
Monocytes Relative: 8 % (ref 3–12)
Neutro Abs: 3.6 10*3/uL (ref 1.7–7.7)
Neutrophils Relative %: 63 % (ref 43–77)
Platelets: 217 10*3/uL (ref 150–400)
RBC: 4.61 MIL/uL (ref 3.87–5.11)
RDW: 14.2 % (ref 11.5–15.5)
WBC: 5.7 10*3/uL (ref 4.0–10.5)

## 2012-10-27 LAB — TSH: TSH: 0.64 u[IU]/mL (ref 0.350–4.500)

## 2012-10-27 LAB — LIPASE: Lipase: 11 U/L (ref 0–75)

## 2012-10-28 LAB — HEPATITIS A ANTIBODY, TOTAL: Hep A Total Ab: POSITIVE — AB

## 2012-10-30 LAB — HCV RNA QUANT RFLX ULTRA OR GENOTYP
HCV Quantitative Log: 6.39 {Log} — ABNORMAL HIGH (ref <1.18)
HCV Quantitative: 2447834 IU/mL — ABNORMAL HIGH (ref <15)

## 2012-10-30 LAB — HEPATITIS B SURFACE ANTIBODY,QUALITATIVE: Hep B S Ab: NONREACTIVE

## 2012-10-30 NOTE — Progress Notes (Signed)
CC PCP 

## 2012-10-31 LAB — HEPATITIS C GENOTYPE

## 2012-11-02 ENCOUNTER — Ambulatory Visit (HOSPITAL_COMMUNITY)
Admission: RE | Admit: 2012-11-02 | Discharge: 2012-11-02 | Disposition: A | Discharge status: Home / Self Care | Payer: Medicaid Other | Source: Ambulatory Visit | Attending: Internal Medicine | Admitting: Internal Medicine

## 2012-11-02 ENCOUNTER — Encounter (HOSPITAL_COMMUNITY): Payer: Self-pay | Admitting: *Deleted

## 2012-11-02 ENCOUNTER — Encounter (HOSPITAL_COMMUNITY)
Admission: RE | Disposition: A | Discharge status: Home / Self Care | Payer: Self-pay | Source: Ambulatory Visit | Attending: Internal Medicine

## 2012-11-02 DIAGNOSIS — R6881 Early satiety: Secondary | ICD-10-CM

## 2012-11-02 DIAGNOSIS — R140 Abdominal distension (gaseous): Secondary | ICD-10-CM

## 2012-11-02 DIAGNOSIS — Z1211 Encounter for screening for malignant neoplasm of colon: Secondary | ICD-10-CM | POA: Insufficient documentation

## 2012-11-02 DIAGNOSIS — B182 Chronic viral hepatitis C: Secondary | ICD-10-CM

## 2012-11-02 DIAGNOSIS — A048 Other specified bacterial intestinal infections: Secondary | ICD-10-CM | POA: Insufficient documentation

## 2012-11-02 DIAGNOSIS — K2940 Chronic atrophic gastritis without bleeding: Secondary | ICD-10-CM

## 2012-11-02 DIAGNOSIS — R634 Abnormal weight loss: Secondary | ICD-10-CM

## 2012-11-02 DIAGNOSIS — Z800 Family history of malignant neoplasm of digestive organs: Secondary | ICD-10-CM

## 2012-11-02 DIAGNOSIS — R1013 Epigastric pain: Secondary | ICD-10-CM | POA: Insufficient documentation

## 2012-11-02 DIAGNOSIS — K5900 Constipation, unspecified: Secondary | ICD-10-CM

## 2012-11-02 DIAGNOSIS — I10 Essential (primary) hypertension: Secondary | ICD-10-CM | POA: Insufficient documentation

## 2012-11-02 DIAGNOSIS — R194 Change in bowel habit: Secondary | ICD-10-CM

## 2012-11-02 HISTORY — PX: COLONOSCOPY WITH ESOPHAGOGASTRODUODENOSCOPY (EGD): SHX5779

## 2012-11-02 HISTORY — DX: Gastro-esophageal reflux disease without esophagitis: K21.9

## 2012-11-02 SURGERY — COLONOSCOPY WITH ESOPHAGOGASTRODUODENOSCOPY (EGD)
Anesthesia: Moderate Sedation

## 2012-11-02 MED ORDER — PROMETHAZINE HCL 25 MG/ML IJ SOLN
INTRAMUSCULAR | Status: AC
  Filled 2012-11-02: qty 1

## 2012-11-02 MED ORDER — ONDANSETRON HCL 4 MG/2ML IJ SOLN
INTRAMUSCULAR | Status: AC
  Filled 2012-11-02: qty 2

## 2012-11-02 MED ORDER — SODIUM CHLORIDE 0.9 % IJ SOLN
INTRAMUSCULAR | Status: AC
  Filled 2012-11-02: qty 10

## 2012-11-02 MED ORDER — PROMETHAZINE HCL 25 MG/ML IJ SOLN
25.0000 mg | Freq: Once | INTRAMUSCULAR | Status: AC
  Administered 2012-11-02: 25 mg via INTRAVENOUS

## 2012-11-02 MED ORDER — STERILE WATER FOR IRRIGATION IR SOLN
Status: DC | PRN
  Administered 2012-11-02: 11:00:00

## 2012-11-02 MED ORDER — MEPERIDINE HCL 100 MG/ML IJ SOLN
INTRAMUSCULAR | Status: DC | PRN
  Administered 2012-11-02: 50 mg via INTRAVENOUS
  Administered 2012-11-02 (×2): 25 mg via INTRAVENOUS

## 2012-11-02 MED ORDER — MIDAZOLAM HCL 5 MG/5ML IJ SOLN
INTRAMUSCULAR | Status: DC | PRN
  Administered 2012-11-02 (×3): 1 mg via INTRAVENOUS
  Administered 2012-11-02: 2 mg via INTRAVENOUS
  Administered 2012-11-02 (×2): 1 mg via INTRAVENOUS

## 2012-11-02 MED ORDER — SODIUM CHLORIDE 0.9 % IV SOLN
INTRAVENOUS | Status: DC
  Administered 2012-11-02: 10:00:00 via INTRAVENOUS

## 2012-11-02 MED ORDER — BUTAMBEN-TETRACAINE-BENZOCAINE 2-2-14 % EX AERO
INHALATION_SPRAY | CUTANEOUS | Status: DC | PRN
  Administered 2012-11-02: 1 via TOPICAL

## 2012-11-02 MED ORDER — ONDANSETRON HCL 4 MG/2ML IJ SOLN
INTRAMUSCULAR | Status: DC | PRN
  Administered 2012-11-02: 4 mg via INTRAVENOUS

## 2012-11-02 MED ORDER — MEPERIDINE HCL 100 MG/ML IJ SOLN
INTRAMUSCULAR | Status: AC
  Filled 2012-11-02: qty 2

## 2012-11-02 MED ORDER — MIDAZOLAM HCL 5 MG/5ML IJ SOLN
INTRAMUSCULAR | Status: AC
  Filled 2012-11-02: qty 10

## 2012-11-02 NOTE — H&P (View-Only) (Signed)
Primary Care Physician:  Selinda Flavin, MD  Primary Gastroenterologist:  Roetta Sessions, MD   Chief Complaint  Patient presents with  . Gastrophageal Reflux  . Abdominal Pain    HPI:  Colleen Lee is a 57 y.o. female here for further evaluation of abdominal pain and acid reflux. She was last seen back in 2009. She has a history of positive HCV RNA and we've referred to [name] clinic at that time but patient does not recall going. She states that someone told her [name] have resolved. She also was treated for H. Pylori at that time. EGD and colonoscopy showed erosive duodenitis, hyperplastic sigmoid polyp. She has a history of remote illicit drug use. History of prior daily alcohol consumption one beer daily. She has had no alcohol in over 2 months stating she gave it up for her new years resolution.  Over the past one year she has complained of abdominal pain, change in bowels, GERD. Currently taking Epson salts to help with bowels. Tried "everything else" at the drug store and reports nothing helps. Complains of bloating of the abdomen all the way up under the ribs. This gets better with good bowel movements. Used to have regular bowel movements. No melena, brbpr. Some intermittent vomiting but none in 6 weeks. Taking Zantac for heartburn, helps some. Early satiety. Lack of appetite. Weight down 50 pounds since 07-May-2008. Some weight gain on Megace now. Was going a week without eating and only consuming Ensure.   CT of the abdomen and pelvis without contrast in December 2013 showed mildly dilated pancreatic duct within the pancreatic head and neck measuring 4 mm and unchanged compared to CT in August of 2013. (this was a contrasted study in August 2013). Pancreatic duct prominence in August 2013 reported to be similar to those studies back in January 2011 and 05-07-08 Ibuprofen regularly. BM twice a day right now.    Current Outpatient Prescriptions  Medication Sig Dispense  Refill  . Cyanocobalamin (VITAMIN B 12 PO) Take 1 tablet by mouth daily.      Marland Kitchen gabapentin (NEURONTIN) 100 MG capsule Take 1 capsule (100 mg total) by mouth 3 (three) times daily.  90 capsule  5  . HYDROcodone-acetaminophen (NORCO) 10-325 MG per tablet Take 1 tablet by mouth every 4 (four) hours as needed for pain.  42 tablet  3  . ibuprofen (ADVIL,MOTRIN) 800 MG tablet Take 1 tablet (800 mg total) by mouth every 8 (eight) hours as needed.  90 tablet  5  . lisinopril-hydrochlorothiazide (PRINZIDE,ZESTORETIC) 20-25 MG per tablet Take 1 tablet by mouth daily.        . megestrol (MEGACE) 40 MG/ML suspension Take 200 mg by mouth daily.      . methocarbamol (ROBAXIN) 500 MG tablet Take 1 tablet (500 mg total) by mouth 3 (three) times daily.  60 tablet  1  . ondansetron (ZOFRAN ODT) 8 MG disintegrating tablet Take 1 tablet (8 mg total) by mouth every 8 (eight) hours as needed for nausea.  60 tablet  0  . ondansetron (ZOFRAN) 4 MG tablet Take 1 tablet (4 mg total) by mouth every 6 (six) hours.  12 tablet  0  . potassium chloride SA (K-DUR,KLOR-CON) 20 MEQ tablet Take 1 tablet (20 mEq total) by mouth 2 (two) times daily.  10 tablet  0  . PredniSONE 10 MG KIT Take 1 kit (10 mg total) by mouth as directed.  48 each  0  . promethazine (PHENERGAN) 25  MG tablet Take 1 tablet (25 mg total) by mouth every 6 (six) hours as needed for nausea.  30 tablet  0   No current facility-administered medications for this visit.    Allergies as of 10/25/2012 - Review Complete 10/25/2012  Allergen Reaction Noted  . Penicillins Rash     Past Medical History  Diagnosis Date  . Hypertension   . Fibroids   . [name]     HCV RNA positive July 2009    Past Surgical History  Procedure Laterality Date  . Knee surgery      left knee  . Colonoscopy  01/18/2008    WJX:BJYNWG rectum.  Long redundant colon, a diminutive sigmoid polyp status post cold biopsy removed. Hyperplastic polyp. Repeat colonoscopy June 2014 due  to family history of colon cancer  . Esophagogastroduodenoscopy  01/18/2008    RMR: Normal esophagus, normal  stomach    Family History  Problem Relation Age of Onset  . Colon cancer Brother        . Asthma      FH  . Multiple myeloma Brother   . Liver cancer Sister   . Prostate cancer Brother   . Pancreatic cancer Brother   . Cancer Other     niece, age 14, primary unknown    History   Social History  . Marital Status: Married    Spouse Name: N/A    Number of Children: 0  . Years of Education: N/A   Occupational History  . umemployed     Social History Main Topics  . Smoking status: Current Every Day Smoker -- 0.50 packs/day    Types: Cigarettes  . Smokeless tobacco: Not on file  . Alcohol Use: Yes     Comment: former, no etoh since jan 2014  . Drug Use: No     Comment: remote in 1970s  . Sexually Active: Not on file   Other Topics Concern  . Not on file   Social History Narrative  . No narrative on file      ROS:  General: positive for anorexia, weight loss. Negative for  fever, chills, fatigue, weakness. Eyes: Negative for vision changes.  ENT: Negative for hoarseness, difficulty swallowing, nasal congestion. CV: Negative for chest pain, angina, palpitations, dyspnea on exertion, peripheral edema.  Respiratory: Negative for dyspnea at rest, dyspnea on exertion, cough, sputum, wheezing.  GI: See history of present illness. GU:  Negative for dysuria, hematuria, urinary incontinence, urinary frequency, nocturnal urination.  MS: Negative for joint pain. Positive for low back pain, recent MRI of the back.  Derm: Negative for rash or itching.  Neuro: Negative for weakness, abnormal sensation, seizure, frequent headaches, memory loss, confusion.  Psych: Negative for anxiety, depression, suicidal ideation, hallucinations.  Endo:see history of present illness  Heme: Negative for bruising or bleeding. Allergy: Negative for rash or hives.    Physical  Examination:  BP 129/76  Pulse 70  Temp(Src) 97.4 F (36.3 C) (Oral)  Ht 5\' 6"  (1.676 m)  Wt 126 lb 3.2 oz (57.244 kg)  BMI 20.38 kg/m2   General: Well-nourished, well-developed in no acute distress.  Head: Normocephalic, atraumatic.   Eyes: Conjunctiva pink, no icterus. Mouth: Oropharyngeal mucosa moist and pink , no lesions erythema or exudate. Neck: Supple without thyromegaly, masses, or lymphadenopathy.  Lungs: Clear to auscultation bilaterally.  Heart: Regular rate and rhythm, no murmurs rubs or gallops.  Abdomen: Bowel sounds are normal, moderate epigastric tenderness, nondistended, no hepatosplenomegaly or masses, no abdominal  bruits or    hernia , no rebound or guarding.   Rectal: not performed Extremities: No lower extremity edema. No clubbing or deformities.  Neuro: Alert and oriented x 4 , grossly normal neurologically.  Skin: Warm and dry, no rash or jaundice.   Psych: Alert and cooperative, normal mood and affect.  Labs: Lab Results  Component Value Date   WBC 5.8 09/26/2012   HGB 16.8* 09/26/2012   HCT 48.0* 09/26/2012   MCV 87.4 09/26/2012   PLT 143* 09/26/2012   Lab Results  Component Value Date   CREATININE 0.56 09/26/2012   BUN 7 09/26/2012   NA 136 09/26/2012   K 3.3* 09/26/2012   CL 98 09/26/2012   CO2 24 09/26/2012   Lab Results  Component Value Date   ALT 24 09/26/2012   AST 25 09/26/2012   ALKPHOS 87 09/26/2012   BILITOT 0.9 09/26/2012   Lab Results  Component Value Date   LIPASE 22 09/26/2012     Imaging Studies: Dg Lumbar Spine 2-3 Views  10/03/2012  Radiology report 3 views lumbar spine  Patient complains of radicular leg pain  There is a coronal plane malalignment of the lumbar spine on the spot  films the disc spaces appear to be well-preserved  Impression this appears to be reactive coronal plane malalignment in the  lumbar spine without degenerative disc disease, there appears to be  calcification of the aorta are or iliac vessels near the lower  lumbar  area.   Mr Lumbar Spine Wo Contrast  10/10/2012  *RADIOLOGY REPORT*  Clinical Data: Low back pain radiating to both legs.  MRI LUMBAR SPINE WITHOUT CONTRAST  Technique:  Multiplanar and multiecho pulse sequences of the lumbar spine were obtained without intravenous contrast.  Comparison: Two views lumbar spine 09/28/2012.  CT abdomen and pelvis 03/23/2012.  Findings: The patient was unable to complete the exam and no axial T1-weighted sequence is provided.  Marrow signal is somewhat decreased diffusely but remains brighter than disc.  No focal marrow lesion is identified.  Mild convex right curvature is noted. Vertebral body height is maintained.  The conus medullaris is normal in signal and position.  The T11-12 level is imaged in the sagittal plane only and negative.  T12-L1:  Negative.  L1-2:  Negative.  L2-3:  Minimal disc bulge without central canal or foraminal stenosis.  L3-4:  Minimal disc bulge without central canal or foraminal narrowing.  L4-5:  Negative.  L5-S1:  Mild facet arthropathy and a shallow disc bulge.  Central canal and foramina widely patent.  IMPRESSION:  1.  Mild degenerative disc disease without central canal or foraminal stenosis. 2.  Decreased marrow signal is nonspecific but could be due to cigarette smoking.  No focal marrow lesion is identified.   Original Report Authenticated By: Holley Dexter, M.D.    Dg Abd Acute W/chest  09/26/2012  *RADIOLOGY REPORT*  Clinical Data: Abdominal pain, diarrhea  ACUTE ABDOMEN SERIES (ABDOMEN 2 VIEW & CHEST 1 VIEW)  Comparison: CT 07/17/2012  Findings: Normal cardiac silhouette.  Lungs are clear.  No free air beneath hemidiaphragms.  Prominent nipple shadows.  No dilated loops of large or small bowel.  There is gas in the rectum.  Calcified leiomyomas noted in the pelvis.  IMPRESSION:  1. No acute cardiopulmonary process. 2.  No evidence of bowel obstruction or intraperitoneal free air.   Original Report Authenticated By: Genevive Bi, M.D.

## 2012-11-02 NOTE — Op Note (Signed)
[facility] [address] [place], [address]   COLONOSCOPY PROCEDURE REPORT  PATIENT: Colleen, Lee  MR#:         [id number] BIRTHDATE: 1956/02/26 , 56  yrs. old GENDER: Female ENDOSCOPIST: R.  Roetta Sessions, MD FACP FACG REFERRED BY:  Selinda Flavin, M.D. PROCEDURE DATE:  11/02/2012 PROCEDURE:     incomplete screening colonoscopy  INDICATIONS: High risk colorectal cancer screening  INFORMED CONSENT:  The risks, benefits, alternatives and imponderables including but not limited to bleeding, perforation as well as the possibility of a missed lesion have been reviewed.  The potential for biopsy, lesion removal, etc. have also been discussed.  Questions have been answered.  All parties agreeable. Please see the history and physical in the medical record for more information.  MEDICATIONS: Versed 7 mg IV and Demerol 100 mg IV and Phenergan 25 mg IV and Zofran 4 mg.  DESCRIPTION OF PROCEDURE:  After a digital rectal exam was performed, the EC-[id number] ([id number])  colonoscope was advanced from the anus through the rectum and colon to the area more proximal colon...  The mucosal surfaces were carefully surveyed utilizing scope tip deflection to facilitate fold flattening as needed.  The scope was pulled down into the rectum where a thorough examination was performed.    FINDINGS:  Adequate preparation. Long and redundant colon. Patient was poorly tolerant of any movement/manipulation of the colonoscope. I was not able to reach the cecum for this reason. I suspect I was well into the proximal colon. No mucosal abnormalities observed.. Rectal vault was small; unable to retroflex. Rectal mucosa seen well on face.  THERAPEUTIC / DIAGNOSTIC MANEUVERS PERFORMED:  None  COMPLICATIONS: None  CECAL WITHDRAWAL TIME:  not applicable  IMPRESSION:  Incomplete colonoscopy as outlined above.  RECOMMENDATIONS: Will obtain an air-contrast barium enema to image the cecum not  seen today. See EGD report.   _______________________________ eSigned:  R. Roetta Sessions, MD FACP [facility] 11/02/2012 12:04 PM   CC:

## 2012-11-02 NOTE — Op Note (Signed)
[facility] [address] [place], [address]   ENDOSCOPY PROCEDURE REPORT  PATIENT: Colleen Lee, Colleen Lee  MR#: [id number] BIRTHDATE: 05/13/1956 , 56  yrs. old GENDER: Female ENDOSCOPIST: R.  Roetta Sessions, MD FACP FACG REFERRED BY:  Selinda Flavin, M.D. PROCEDURE DATE:  11/02/2012 PROCEDURE:     EGD with gastric biopsy  INDICATIONS:     Early satiety/epigastric pain  INFORMED CONSENT:   The risks, benefits, limitations, alternatives and imponderables have been discussed.  The potential for biopsy, esophogeal dilation, etc. have also been reviewed.  Questions have been answered.  All parties agreeable.  Please see the history and physical in the medical record for more information.  MEDICATIONS:  Versed 4 mg IV and Demerol 100 mg IV in divided doses. Zofran 4 mg IV and Phenergan 25 mg IV.  DESCRIPTION OF PROCEDURE:   The EG-[id number] ([id number])  endoscope was introduced through the mouth and advanced to the second portion of the duodenum without difficulty or limitations.  The mucosal surfaces were surveyed very carefully during advancement of the scope and upon withdrawal.  Retroflexion view of the proximal stomach and esophagogastric junction was performed.      FINDINGS: Normal esophagus. No esophageal varices Stomach empty. Mottling of the gastric mucosa of uncertain significance. No ulcer or infiltrating process observed. Pylorus patent. Normal first and second portion of the duodenum.  THERAPEUTIC / DIAGNOSTIC MANEUVERS PERFORMED:  Biopsies the gastric mucosa taken for histologic study   COMPLICATIONS:  None  IMPRESSION:  Abnormal gastric mucosa of doubtful clinical significance-status post biopsy  RECOMMENDATIONS:  Followup on pathology. See colonoscopy report.    _______________________________ R. Roetta Sessions, MD FACP [facility] eSigned:  R. Roetta Sessions, MD FACP [facility] 11/02/2012 11:34 AM     CC:

## 2012-11-02 NOTE — Interval H&P Note (Signed)
History and Physical Interval Note:  11/02/2012 11:13 AM  Colleen Lee  has presented today for surgery, with the diagnosis of Change in bowels, early satiety, weight loss, family history of colon cancer, abdominal pain  The various methods of treatment have been discussed with the patient and family. After consideration of risks, benefits and other options for treatment, the patient has consented to  Procedure(s) with comments: COLONOSCOPY WITH ESOPHAGOGASTRODUODENOSCOPY (EGD) (N/A) - 10;45 as a surgical intervention .  The patient's history has been reviewed, patient examined, no change in status, stable for surgery.  I have reviewed the patient's chart and labs.  Questions were answered to the patient's satisfaction.     No dysphagia. EGD and colonoscopy per plan  Eula Listen

## 2012-11-02 NOTE — Progress Notes (Signed)
Incomplete colonoscopy.  No cecal withdrawal  Time. Cliffton Asters RN, BSN, CNOR, [employer]

## 2012-11-05 ENCOUNTER — Encounter: Payer: Self-pay | Admitting: Internal Medicine

## 2012-11-06 ENCOUNTER — Encounter (HOSPITAL_COMMUNITY): Payer: Self-pay | Admitting: Internal Medicine

## 2012-11-06 ENCOUNTER — Telehealth: Payer: Self-pay

## 2012-11-06 NOTE — Telephone Encounter (Signed)
Letter mailed to pt.  

## 2012-11-06 NOTE — Telephone Encounter (Signed)
Letter from: Corbin Ade   Reason for Letter: Results Review   Send letter to patient.  Send copy of letter with path to referring provider and PCP.  Raynelle Fanning: pt needs pylera x 10 days- please also have pt inc prilosec toBID during treatment , then back off to once daily thereafter   Need ov in coming weeks with extender

## 2012-11-07 ENCOUNTER — Encounter: Payer: Self-pay | Admitting: General Practice

## 2012-11-07 ENCOUNTER — Other Ambulatory Visit: Payer: Self-pay | Admitting: Internal Medicine

## 2012-11-07 DIAGNOSIS — Z9889 Other specified postprocedural states: Secondary | ICD-10-CM

## 2012-11-07 MED ORDER — BIS SUBCIT-METRONID-TETRACYC 140-125-125 MG PO CAPS: 3.0000 | ORAL_CAPSULE | Freq: Three times a day (TID) | ORAL | Status: DC

## 2012-11-07 NOTE — Telephone Encounter (Signed)
Patient is scheduled for ACBE on Thursday April 10th at 10:00 and she is aware to go by Radiology and pick up prep kit

## 2012-11-07 NOTE — Telephone Encounter (Signed)
PT SCHEDULED FOR 11/21/12@9AM  WITH [facility]

## 2012-11-07 NOTE — Telephone Encounter (Addendum)
Pt aware. rx sent to pharmacy. Pt will need PA for pylera, she is allergic to PCN. Paperwork started and on [facility] desk. Pt is aware to increase prilosec to bid while on pylera.    Leighann, per RMR procedure note, pt needs ACBE.  Darl Pikes, please schedule ov

## 2012-11-08 ENCOUNTER — Other Ambulatory Visit (HOSPITAL_COMMUNITY): Payer: Medicaid Other

## 2012-11-09 ENCOUNTER — Other Ambulatory Visit: Payer: Self-pay | Admitting: Internal Medicine

## 2012-11-09 ENCOUNTER — Ambulatory Visit (HOSPITAL_COMMUNITY)
Admission: RE | Admit: 2012-11-09 | Discharge: 2012-11-09 | Disposition: A | Discharge status: Home / Self Care | Payer: Medicaid Other | Source: Ambulatory Visit | Attending: Internal Medicine | Admitting: Internal Medicine

## 2012-11-09 DIAGNOSIS — R109 Unspecified abdominal pain: Secondary | ICD-10-CM | POA: Insufficient documentation

## 2012-11-09 DIAGNOSIS — D259 Leiomyoma of uterus, unspecified: Secondary | ICD-10-CM | POA: Insufficient documentation

## 2012-11-09 DIAGNOSIS — Z9889 Other specified postprocedural states: Secondary | ICD-10-CM | POA: Insufficient documentation

## 2012-11-10 NOTE — Progress Notes (Signed)
Quick Note:  Labs show active [name], genotype 1a. She is immune to Hepatitis A. She need vaccines for Hepatitis B. Other labs are good.  Please arrange for patient to get Hep B vaccine. She needs to keep appt here 11/21/12.  ______

## 2012-11-16 ENCOUNTER — Encounter: Payer: Self-pay | Admitting: Internal Medicine

## 2012-11-16 NOTE — Progress Notes (Signed)
Quick Note:  Pt is aware. Mailed orders for her to get [facility] B vaccines at [facility]. She is aware of her appt. ______

## 2012-11-20 ENCOUNTER — Other Ambulatory Visit: Payer: Self-pay | Admitting: *Deleted

## 2012-11-20 DIAGNOSIS — M549 Dorsalgia, unspecified: Secondary | ICD-10-CM

## 2012-11-20 MED ORDER — HYDROCODONE-ACETAMINOPHEN 5-325 MG PO TABS: 1.0000 | ORAL_TABLET | ORAL | Status: DC | PRN

## 2012-11-21 ENCOUNTER — Encounter: Payer: Self-pay | Admitting: Gastroenterology

## 2012-11-21 ENCOUNTER — Ambulatory Visit (INDEPENDENT_AMBULATORY_CARE_PROVIDER_SITE_OTHER): Payer: Medicaid Other | Admitting: Gastroenterology

## 2012-11-21 VITALS — BP 132/80 | HR 63 | Temp 98.0°F | Ht 66.0 in | Wt 121.2 lb

## 2012-11-21 DIAGNOSIS — B182 Chronic viral hepatitis C: Secondary | ICD-10-CM

## 2012-11-21 DIAGNOSIS — R630 Anorexia: Secondary | ICD-10-CM

## 2012-11-21 DIAGNOSIS — R6881 Early satiety: Secondary | ICD-10-CM

## 2012-11-21 DIAGNOSIS — R634 Abnormal weight loss: Secondary | ICD-10-CM

## 2012-11-21 DIAGNOSIS — Z800 Family history of malignant neoplasm of digestive organs: Secondary | ICD-10-CM

## 2012-11-21 NOTE — Assessment & Plan Note (Signed)
Abnormal ongoing weight loss, 55 pounds since 04/2008. Currently on Pylera for H.Pylori. Thyroid function recently normal. No significant improvement on Megace. C/O anorexia, early satiety, Nausea.   See where things settle out after H.pylori treatment. Await virtual colonoscopy findings. She has h/o stable but prominent pancreatitic duct of unclear significant. ?further imaging. To discuss with Dr. Jena Gauss.

## 2012-11-21 NOTE — Progress Notes (Signed)
Please let the patient know that Dr. Jena Gauss does not recommend colonoscopy in the OR.  Due to the kinks in her colon, he thinks he will not be able to get all the way around even if she is put to sleep. He recommends virtual colonoscopy. We can give something to calm her nerves if needed. It is very important that the rest of her colon is looked at due to her family history of colon cancer.

## 2012-11-21 NOTE — Progress Notes (Signed)
Cc PCP 

## 2012-11-21 NOTE — Progress Notes (Signed)
Primary Care Physician:  Selinda Flavin, MD  Primary Gastroenterologist:  Roetta Sessions, MD   Chief Complaint  Patient presents with  . Follow-up    HPI:  Colleen Lee is a 57 y.o. female here for followup visit. She was seen back on 10/25/2012 for abdominal pain and acid reflux. She also has a history of chronic [name] but failed to followup at the hepatitis clinic several years ago. Recent HCV RNA remains positive. She is immune to hepatitis a based on serologies but needs vaccination for hepatitis B. She plans to go to health department for this. She has a history of remote illicit drug use. History of prior daily alcohol consumption up until January of this year.  At time of recent EGD she was found to have H. pylori gastritis. She is currently on Pylera. Stools loose on Pylera. Her colonoscopy was incomplete due to discomfort with exam. She had adequate bowel prep. She presented for air-contrast barium enema but due to pain she declined to pursue further. She was recommended to have her virtual colonoscopy due to difficulty of her exam. Colon very tortuous and difficult to manipulate throughout entire length of the colon. She denies rectal bleeding. She has a poor appetite. She continues to lose weight. She is down an additional 5 pounds. This is a documented 55 pounds in September of 2009. No heartburn on omeprazole. Continues to have pressure and pain up underneath the ribs especially on the left side. Some days no solid food and only drinks Ensure. No improvement on Megace.  CT of abdomen and pelvis without contrast in December 2013 showed mildly dilated pancreatic duct within the pancreatic head and neck measuring 4 mm and unchanged compared CT in August of 2013 (this was a contrasted study). Pancreatic duct prominence in August of 2013 reported to be similar to those studies back in January of 2011 in September 2009.  Current Outpatient Prescriptions  Medication Sig Dispense Refill   . bismuth-metronidazole-tetracycline (PYLERA) 140-125-125 MG per capsule Take 3 capsules by mouth 4 (four) times daily -  before meals and at bedtime.  120 capsule  0  . Cyanocobalamin (VITAMIN B 12 PO) Take 1 tablet by mouth daily.      Marland Kitchen gabapentin (NEURONTIN) 100 MG capsule Take 1 capsule (100 mg total) by mouth 3 (three) times daily.  90 capsule  5  . HYDROcodone-acetaminophen (NORCO/VICODIN) 5-325 MG per tablet Take 1 tablet by mouth every 4 (four) hours as needed for pain.  90 tablet  5  . ibuprofen (ADVIL,MOTRIN) 800 MG tablet Take 1 tablet (800 mg total) by mouth every 8 (eight) hours as needed.  90 tablet  5  . lisinopril-hydrochlorothiazide (PRINZIDE,ZESTORETIC) 20-25 MG per tablet Take 1 tablet by mouth daily.        . megestrol (MEGACE) 40 MG/ML suspension Take 200 mg by mouth daily.      . methocarbamol (ROBAXIN) 500 MG tablet Take 1 tablet (500 mg total) by mouth 3 (three) times daily.  60 tablet  1  . omeprazole (PRILOSEC) 20 MG capsule Take 1 capsule (20 mg total) by mouth daily.  30 capsule  11  . ondansetron (ZOFRAN ODT) 8 MG disintegrating tablet Take 1 tablet (8 mg total) by mouth every 8 (eight) hours as needed for nausea.  60 tablet  0  . ondansetron (ZOFRAN) 4 MG tablet Take 1 tablet (4 mg total) by mouth every 6 (six) hours.  12 tablet  0  . potassium chloride SA (K-DUR,KLOR-CON)  20 MEQ tablet Take 1 tablet (20 mEq total) by mouth 2 (two) times daily.  10 tablet  0  . promethazine (PHENERGAN) 25 MG tablet Take 1 tablet (25 mg total) by mouth every 6 (six) hours as needed for nausea.  30 tablet  0   No current facility-administered medications for this visit.    Allergies as of 11/21/2012 - Review Complete 11/21/2012  Allergen Reaction Noted  . Penicillins Rash     Past Medical History  Diagnosis Date  . Hypertension   . Fibroids   . [name]     HCV RNA positive 09/2012  . GERD (gastroesophageal reflux disease)   . H. pylori infection 2014    treated with  pylera    Past Surgical History  Procedure Laterality Date  . Knee surgery      left knee  . Colonoscopy  01/18/2008    NWG:[id number] rectum.  Long redundant colon, a diminutive sigmoid polyp status post cold biopsy removed. Hyperplastic polyp. Repeat colonoscopy June 2014 due to family history of colon cancer  . Esophagogastroduodenoscopy  01/18/2008    RMR: Normal esophagus, normal  stomach  . Colonoscopy with esophagogastroduodenoscopy (egd) N/A 11/02/2012    YQM:VHQIONGE gastric mucosa of doubtful, +H.pylori. Incomplete colonoscopy due to patient unable to tolerate exam, proximal colon seen. Patient refused ACBE.    Family History  Problem Relation Age of Onset  . Colon cancer Brother        . Asthma      FH  . Multiple myeloma Brother   . Liver cancer Sister   . Prostate cancer Brother   . Pancreatic cancer Brother   . Cancer Other     niece, age 22, primary unknown    History   Social History  . Marital Status: Married    Spouse Name: N/A    Number of Children: 0  . Years of Education: N/A   Occupational History  . umemployed     Social History Main Topics  . Smoking status: Current Every Day Smoker -- 0.50 packs/day    Types: Cigarettes  . Smokeless tobacco: Not on file  . Alcohol Use: Yes     Comment: former, no etoh since jan 2014  . Drug Use: No     Comment: remote in 1970s  . Sexually Active: No   Other Topics Concern  . Not on file   Social History Narrative  . No narrative on file      ROS:  General: Negative for fever, chills, fatigue, weakness. See HPI. Eyes: Negative for vision changes.  ENT: Negative for hoarseness, difficulty swallowing , nasal congestion. CV: Negative for chest pain, angina, palpitations, dyspnea on exertion, peripheral edema.  Respiratory: Negative for dyspnea at rest, dyspnea on exertion, cough, sputum, wheezing.  GI: See history of present illness. GU:  Negative for dysuria, hematuria, urinary incontinence, urinary  frequency, nocturnal urination.  MS: Negative for joint pain, low back pain.  Derm: Negative for rash or itching.  Neuro: Negative for weakness, abnormal sensation, seizure, frequent headaches, memory loss, confusion.  Psych: Negative for anxiety, depression, suicidal ideation, hallucinations.  Endo: see hpi Heme: Negative for bruising or bleeding. Allergy: Negative for rash or hives.    Physical Examination:  BP 132/80  Pulse 63  Temp(Src) 98 F (36.7 C) (Oral)  Ht 5\' 6"  (1.676 m)  Wt 121 lb 3.2 oz (54.976 kg)  BMI 19.57 kg/m2   General: Well-nourished, well-developed in no acute distress.  Head: Normocephalic,  atraumatic.   Eyes: Conjunctiva pink, no icterus. Mouth: Oropharyngeal mucosa moist and pink , no lesions erythema or exudate. Neck: Supple without thyromegaly, masses, or lymphadenopathy.  Lungs: Clear to auscultation bilaterally.  Heart: Regular rate and rhythm, no murmurs rubs or gallops.  Abdomen: Bowel sounds are normal, lower abdominal tenderness, slight upper abd distention. no hepatosplenomegaly or masses, no abdominal bruits or    hernia , no rebound or guarding.   Rectal: not performed Extremities: No lower extremity edema. No clubbing or deformities.  Neuro: Alert and oriented x 4 , grossly normal neurologically.  Skin: Warm and dry, no rash or jaundice.   Psych: Alert and cooperative, normal mood and affect.  Labs: Lab Results  Component Value Date   WBC 5.7 10/27/2012   HGB 14.3 10/27/2012   HCT 42.6 10/27/2012   MCV 92.4 10/27/2012   PLT 217 10/27/2012   Lab Results  Component Value Date   LIPASE 11 10/27/2012   Lab Results  Component Value Date   ALT 28 10/27/2012   AST 24 10/27/2012   ALKPHOS 64 10/27/2012   BILITOT 0.6 10/27/2012   Lab Results  Component Value Date   CREATININE 0.76 10/27/2012   BUN 7 10/27/2012   NA 135 10/27/2012   K 4.2 10/27/2012   CL 108 10/27/2012   CO2 19 10/27/2012   Lab Results  Component Value Date   HAV POS*  10/27/2012    Hep B S Ab: NR HCV RNA: positive, genotype 1a  Lab Results  Component Value Date   TSH 0.640 10/27/2012     Imaging Studies: Dg Abd 1 View  11/09/2012  **ADDENDUM** CREATED: 11/09/2012 14:17:33  There are a KUB was performed, the patient refused the barium enema.  The KUB was interpreted.  **END ADDENDUM** SIGNED BY: Marlowe Aschoff. Hoss, M.D.   11/09/2012  *RADIOLOGY REPORT*  Clinical Data: Incomplete colonoscopy.  ABDOMEN - 1 VIEW  Technique:  After obtaining a scout radiograph air-contrast barium enema was performed under fluoroscopy using high-density barium.  Comparison: CT abdomen and pelvis 07/17/2012.  Findings: The bowel gas pattern is normal. Calcified uterine fibroid in the right hemi pelvis is identified as seen on the prior study.  There is some degenerative change about the hips.  IMPRESSION: No acute abnormality.   Original Report Authenticated By: Holley Dexter, M.D.

## 2012-11-21 NOTE — Patient Instructions (Addendum)
Please have the hepatitis B vaccines done at the health department. I will discuss possibility of you doing a colonoscopy in the OR with Dr. Jena Gauss. We'll let you know what he says. Try to eat small snack every 2-3 hours to maintain your weight.

## 2012-11-21 NOTE — Assessment & Plan Note (Addendum)
Untreated. Address later if patient remains etoh free. Liver imaged with contrast CT 03/2012, focal steatosis but otherwise unremarkable.

## 2012-11-21 NOTE — Assessment & Plan Note (Signed)
FH colon cancer. Patient with incomplete colonoscopy due to tortuous colon. Patient did not tolerate ACBE. Right colon has not been evaluated. Discussed with Dr. Jena Gauss. Virtual colonoscopy recommended as the best approach at this point to evaluate the remaining part of her colon. It is likely that her entire colon would not be reached with scope even in setting of general anesthesia.

## 2012-11-22 ENCOUNTER — Other Ambulatory Visit: Payer: Self-pay | Admitting: Internal Medicine

## 2012-11-22 DIAGNOSIS — Z1211 Encounter for screening for malignant neoplasm of colon: Secondary | ICD-10-CM

## 2012-11-22 NOTE — Progress Notes (Signed)
Pt is aware and is agreeable to virtual tcs. Benedetto Goad, please schedule.

## 2012-11-22 NOTE — Progress Notes (Signed)
Patient is scheduled no Tuesday May 6th at 7:45 am and she is aware to go by [address] and pick up prep

## 2012-11-26 ENCOUNTER — Emergency Department (HOSPITAL_COMMUNITY)
Admission: EM | Admit: 2012-11-26 | Discharge: 2012-11-26 | Disposition: A | Discharge status: Home / Self Care | Payer: Medicaid Other | Attending: Emergency Medicine | Admitting: Emergency Medicine

## 2012-11-26 ENCOUNTER — Encounter (HOSPITAL_COMMUNITY): Payer: Self-pay | Admitting: Emergency Medicine

## 2012-11-26 ENCOUNTER — Emergency Department (HOSPITAL_COMMUNITY): Payer: Medicaid Other

## 2012-11-26 DIAGNOSIS — F17200 Nicotine dependence, unspecified, uncomplicated: Secondary | ICD-10-CM | POA: Insufficient documentation

## 2012-11-26 DIAGNOSIS — R5383 Other fatigue: Secondary | ICD-10-CM | POA: Insufficient documentation

## 2012-11-26 DIAGNOSIS — R109 Unspecified abdominal pain: Secondary | ICD-10-CM | POA: Insufficient documentation

## 2012-11-26 DIAGNOSIS — Z79899 Other long term (current) drug therapy: Secondary | ICD-10-CM | POA: Insufficient documentation

## 2012-11-26 DIAGNOSIS — Z880 Allergy status to penicillin: Secondary | ICD-10-CM | POA: Insufficient documentation

## 2012-11-26 DIAGNOSIS — G8929 Other chronic pain: Secondary | ICD-10-CM | POA: Insufficient documentation

## 2012-11-26 DIAGNOSIS — M7989 Other specified soft tissue disorders: Secondary | ICD-10-CM | POA: Insufficient documentation

## 2012-11-26 DIAGNOSIS — R0602 Shortness of breath: Secondary | ICD-10-CM | POA: Insufficient documentation

## 2012-11-26 DIAGNOSIS — R5381 Other malaise: Secondary | ICD-10-CM | POA: Insufficient documentation

## 2012-11-26 DIAGNOSIS — M549 Dorsalgia, unspecified: Secondary | ICD-10-CM | POA: Insufficient documentation

## 2012-11-26 DIAGNOSIS — M542 Cervicalgia: Secondary | ICD-10-CM | POA: Insufficient documentation

## 2012-11-26 DIAGNOSIS — K5289 Other specified noninfective gastroenteritis and colitis: Secondary | ICD-10-CM | POA: Insufficient documentation

## 2012-11-26 DIAGNOSIS — K529 Noninfective gastroenteritis and colitis, unspecified: Secondary | ICD-10-CM

## 2012-11-26 DIAGNOSIS — I10 Essential (primary) hypertension: Secondary | ICD-10-CM | POA: Insufficient documentation

## 2012-11-26 DIAGNOSIS — J3489 Other specified disorders of nose and nasal sinuses: Secondary | ICD-10-CM | POA: Insufficient documentation

## 2012-11-26 DIAGNOSIS — Z8742 Personal history of other diseases of the female genital tract: Secondary | ICD-10-CM | POA: Insufficient documentation

## 2012-11-26 DIAGNOSIS — R634 Abnormal weight loss: Secondary | ICD-10-CM | POA: Insufficient documentation

## 2012-11-26 DIAGNOSIS — Z8619 Personal history of other infectious and parasitic diseases: Secondary | ICD-10-CM | POA: Insufficient documentation

## 2012-11-26 DIAGNOSIS — R197 Diarrhea, unspecified: Secondary | ICD-10-CM | POA: Insufficient documentation

## 2012-11-26 DIAGNOSIS — K219 Gastro-esophageal reflux disease without esophagitis: Secondary | ICD-10-CM | POA: Insufficient documentation

## 2012-11-26 LAB — CBC WITH DIFFERENTIAL/PLATELET
Basophils Absolute: 0 10*3/uL (ref 0.0–0.1)
Basophils Relative: 1 % (ref 0–1)
Eosinophils Absolute: 0 10*3/uL (ref 0.0–0.7)
Eosinophils Relative: 0 % (ref 0–5)
HCT: 42.7 % (ref 36.0–46.0)
Hemoglobin: 15.2 g/dL — ABNORMAL HIGH (ref 12.0–15.0)
Lymphocytes Relative: 11 % — ABNORMAL LOW (ref 12–46)
Lymphs Abs: 0.7 10*3/uL (ref 0.7–4.0)
MCH: 31.8 pg (ref 26.0–34.0)
MCHC: 35.6 g/dL (ref 30.0–36.0)
MCV: 89.3 fL (ref 78.0–100.0)
Monocytes Absolute: 0.1 10*3/uL (ref 0.1–1.0)
Monocytes Relative: 2 % — ABNORMAL LOW (ref 3–12)
Neutro Abs: 5.6 10*3/uL (ref 1.7–7.7)
Neutrophils Relative %: 86 % — ABNORMAL HIGH (ref 43–77)
Platelets: 179 10*3/uL (ref 150–400)
RBC: 4.78 MIL/uL (ref 3.87–5.11)
RDW: 13.9 % (ref 11.5–15.5)
WBC: 6.5 10*3/uL (ref 4.0–10.5)

## 2012-11-26 LAB — COMPREHENSIVE METABOLIC PANEL
ALT: 33 U/L (ref 0–35)
AST: 31 U/L (ref 0–37)
Albumin: 4.4 g/dL (ref 3.5–5.2)
Alkaline Phosphatase: 76 U/L (ref 39–117)
BUN: 4 mg/dL — ABNORMAL LOW (ref 6–23)
CO2: 23 mEq/L (ref 19–32)
Calcium: 10.2 mg/dL (ref 8.4–10.5)
Chloride: 100 mEq/L (ref 96–112)
Creatinine, Ser: 0.59 mg/dL (ref 0.50–1.10)
GFR calc Af Amer: >90 mL/min (ref >90)
GFR calc non Af Amer: >90 mL/min (ref >90)
Glucose, Bld: 126 mg/dL — ABNORMAL HIGH (ref 70–99)
Potassium: 3.7 mEq/L (ref 3.5–5.1)
Sodium: 136 mEq/L (ref 135–145)
Total Bilirubin: 1 mg/dL (ref 0.3–1.2)
Total Protein: 9.1 g/dL — ABNORMAL HIGH (ref 6.0–8.3)

## 2012-11-26 LAB — LIPASE, BLOOD: Lipase: 20 U/L (ref 11–59)

## 2012-11-26 MED ORDER — PROMETHAZINE HCL 25 MG PO TABS: 25.0000 mg | ORAL_TABLET | Freq: Four times a day (QID) | ORAL | Status: DC | PRN

## 2012-11-26 MED ORDER — HYDROMORPHONE HCL PF 1 MG/ML IJ SOLN
1.0000 mg | Freq: Once | INTRAMUSCULAR | Status: DC
  Filled 2012-11-26: qty 1

## 2012-11-26 MED ORDER — HYDROCODONE-ACETAMINOPHEN 5-325 MG PO TABS: 1.0000 | ORAL_TABLET | Freq: Four times a day (QID) | ORAL | Status: DC | PRN

## 2012-11-26 MED ORDER — SODIUM CHLORIDE 0.9 % IV SOLN: INTRAVENOUS | Status: DC

## 2012-11-26 MED ORDER — SODIUM CHLORIDE 0.9 % IV BOLUS (SEPSIS)
500.0000 mL | Freq: Once | INTRAVENOUS | Status: AC
  Administered 2012-11-26: 500 mL via INTRAVENOUS

## 2012-11-26 MED ORDER — ONDANSETRON HCL 4 MG/2ML IJ SOLN
INTRAMUSCULAR | Status: AC
  Administered 2012-11-26: 4 mg
  Filled 2012-11-26: qty 2

## 2012-11-26 MED ORDER — ONDANSETRON 4 MG PO TBDP: 4.0000 mg | ORAL_TABLET | Freq: Three times a day (TID) | ORAL | Status: DC | PRN

## 2012-11-26 MED ORDER — METOCLOPRAMIDE HCL 5 MG/ML IJ SOLN
10.0000 mg | Freq: Once | INTRAMUSCULAR | Status: AC
  Administered 2012-11-26: 10 mg via INTRAVENOUS
  Filled 2012-11-26: qty 2

## 2012-11-26 MED ORDER — HYDROMORPHONE HCL PF 1 MG/ML IJ SOLN
1.0000 mg | Freq: Once | INTRAMUSCULAR | Status: AC
  Administered 2012-11-26: 1 mg via INTRAVENOUS
  Filled 2012-11-26: qty 1

## 2012-11-26 MED ORDER — ONDANSETRON HCL 4 MG/2ML IJ SOLN
4.0000 mg | Freq: Once | INTRAMUSCULAR | Status: AC
  Administered 2012-11-26: 4 mg via INTRAVENOUS
  Filled 2012-11-26: qty 2

## 2012-11-26 NOTE — ED Notes (Signed)
Spoke with edp regarding loss of IV access prior to receiving IV dilaudid.  States pt is okay to be d/c without receiving dose.

## 2012-11-26 NOTE — ED Provider Notes (Signed)
History  This chart was scribed for Shelda Jakes, MD by Ardelia Mems, ED Scribe. This patient was seen in room [facility]06[facility] and the patient's care was started at 7:55 AM.   CSN: [id number]  Arrival date & time 11/26/12  0739     Chief Complaint  Patient presents with  . Emesis    Patient is a 57 y.o. female presenting with vomiting. The history is provided by the patient. No language interpreter was used.  Emesis Severity:  Moderate Duration:  12 hours Timing:  Constant Number of daily episodes:  20 Quality:  Stomach contents Progression:  Unchanged Chronicity:  Recurrent Ineffective treatments:  Antiemetics Associated symptoms: abdominal pain and diarrhea   Associated symptoms: no chills, no cough, no fever and no headaches   Abdominal pain:    Location:  LLQ and RLQ   Duration:  12 hours   Timing:  Intermittent   Progression:  Unchanged   Chronicity:  Recurrent Diarrhea:    Quality:  Watery   Number of occurrences:  20 Risk factors: no diabetes    HPI Comments: Colleen Lee is a 57 y.o. female with h/o HTN and Hep C who presents to the Emergency Department complaining of intermittent nausea, emesis and diarrhea that began last night at 9 PM. She states that she "filled the trashcan up twice" with emesis and has had approximately 20 episodes of emesis since last night. Pt has been seen for similar symptoms in December, January and February. She states that she wants pain medication. Pt has associated intermittent, moderate RLQ and LLQ abdominal pan. Pt has visited Dr. Benard Rink from GI for these symptoms. Pt states that she had an upper endoscopy 2 weeks ago, but no colonoscopy. She has a follow up appointment on 12/05/12. Pt says pain medicine she has at home cannot provide relief. She has chronic neck and back pain. Pt denies having diabetes.    PCP- Dr. Dimas Aguas  Past Medical History  Diagnosis Date  . Hypertension   . Fibroids   . [name]     HCV RNA positive  09/2012  . GERD (gastroesophageal reflux disease)   . H. pylori infection 2014    treated with pylera    Past Surgical History  Procedure Laterality Date  . Knee surgery      left knee  . Colonoscopy  01/18/2008    ZDG:[id number] rectum.  Long redundant colon, a diminutive sigmoid polyp status post cold biopsy removed. Hyperplastic polyp. Repeat colonoscopy June 2014 due to family history of colon cancer  . Esophagogastroduodenoscopy  01/18/2008    RMR: Normal esophagus, normal  stomach  . Colonoscopy with esophagogastroduodenoscopy (egd) N/A 11/02/2012    VQQ:VZDGLOVF gastric mucosa of doubtful, +H.pylori. Incomplete colonoscopy due to patient unable to tolerate exam, proximal colon seen. Patient refused ACBE.    Family History  Problem Relation Age of Onset  . Colon cancer Brother        . Asthma      FH  . Multiple myeloma Brother   . Liver cancer Sister   . Prostate cancer Brother   . Pancreatic cancer Brother   . Cancer Other     niece, age 67, primary unknown    History  Substance Use Topics  . Smoking status: Current Every Day Smoker -- 0.50 packs/day    Types: Cigarettes  . Smokeless tobacco: Not on file  . Alcohol Use: Yes     Comment: former, no etoh since jan 2014  OB History   Grav Para Term Preterm Abortions TAB SAB Ect Mult Living                  Review of Systems  Constitutional: Positive for unexpected weight change. Negative for fever and chills.  HENT: Positive for congestion and neck pain. Negative for rhinorrhea.   Eyes: Negative for visual disturbance.  Respiratory: Positive for shortness of breath.   Cardiovascular: Positive for leg swelling. Negative for chest pain.  Gastrointestinal: Positive for nausea, vomiting, abdominal pain and diarrhea.  Genitourinary: Negative for dysuria.  Musculoskeletal: Positive for back pain.  Skin: Negative for rash.  Neurological: Positive for weakness. Negative for headaches.  Psychiatric/Behavioral:  Negative for confusion.    Allergies  Penicillins  Home Medications   Current Outpatient Rx  Name  Route  Sig  Dispense  Refill  . gabapentin (NEURONTIN) 100 MG capsule   Oral   Take 1 capsule (100 mg total) by mouth 3 (three) times daily.   90 capsule   5   . bismuth-metronidazole-tetracycline (PYLERA) 140-125-125 MG per capsule   Oral   Take 3 capsules by mouth 4 (four) times daily -  before meals and at bedtime.   120 capsule   0   . Cyanocobalamin (VITAMIN B 12 PO)   Oral   Take 1 tablet by mouth daily.         Marland Kitchen HYDROcodone-acetaminophen (NORCO/VICODIN) 5-325 MG per tablet   Oral   Take 1 tablet by mouth every 4 (four) hours as needed for pain.   90 tablet   5   . HYDROcodone-acetaminophen (NORCO/VICODIN) 5-325 MG per tablet   Oral   Take 1-2 tablets by mouth every 6 (six) hours as needed for pain.   14 tablet   0   . ibuprofen (ADVIL,MOTRIN) 800 MG tablet   Oral   Take 1 tablet (800 mg total) by mouth every 8 (eight) hours as needed.   90 tablet   5   . lisinopril-hydrochlorothiazide (PRINZIDE,ZESTORETIC) 20-25 MG per tablet   Oral   Take 1 tablet by mouth daily.           . megestrol (MEGACE) 40 MG/ML suspension   Oral   Take 200 mg by mouth daily.         . methocarbamol (ROBAXIN) 500 MG tablet   Oral   Take 1 tablet (500 mg total) by mouth 3 (three) times daily.   60 tablet   1   . omeprazole (PRILOSEC) 20 MG capsule   Oral   Take 1 capsule (20 mg total) by mouth daily.   30 capsule   11   . ondansetron (ZOFRAN ODT) 4 MG disintegrating tablet   Oral   Take 1 tablet (4 mg total) by mouth every 8 (eight) hours as needed for nausea.   15 tablet   0   . ondansetron (ZOFRAN ODT) 8 MG disintegrating tablet   Oral   Take 1 tablet (8 mg total) by mouth every 8 (eight) hours as needed for nausea.   60 tablet   0   . ondansetron (ZOFRAN) 4 MG tablet   Oral   Take 1 tablet (4 mg total) by mouth every 6 (six) hours.   12 tablet   0    . potassium chloride SA (K-DUR,KLOR-CON) 20 MEQ tablet   Oral   Take 1 tablet (20 mEq total) by mouth 2 (two) times daily.   10 tablet   0   .  promethazine (PHENERGAN) 25 MG tablet   Oral   Take 1 tablet (25 mg total) by mouth every 6 (six) hours as needed for nausea.   30 tablet   0   . promethazine (PHENERGAN) 25 MG tablet   Oral   Take 1 tablet (25 mg total) by mouth every 6 (six) hours as needed for nausea.   20 tablet   0    ;  Triage Vitals: BP 154/96  Pulse 84  Temp(Src) 98.9 F (37.2 C) (Oral)  Resp 40  Ht 5\' 6"  (1.676 m)  Wt 126 lb (57.153 kg)  BMI 20.35 kg/m2  SpO2 100%  Physical Exam  Constitutional: She is oriented to person, place, and time. She appears well-developed and well-nourished.  HENT:  Head: Normocephalic and atraumatic.  Mouth/Throat: Mucous membranes are dry.  Eyes: Conjunctivae and EOM are normal. Pupils are equal, round, and reactive to light.  Neck: Normal range of motion. Neck supple.  Cardiovascular: Normal rate, regular rhythm and normal heart sounds.   No murmur heard. Pulmonary/Chest: Effort normal and breath sounds normal. No respiratory distress.  Abdominal: Soft. She exhibits no distension. Bowel sounds are decreased. There is generalized tenderness. There is no guarding.  Musculoskeletal: Normal range of motion. She exhibits no edema.  No lower extremity swelling.  Neurological: She is alert and oriented to person, place, and time. No cranial nerve deficit.  Skin: Skin is warm and dry. No rash noted.    ED Course  Procedures (including critical care time)  DIAGNOSTIC STUDIES: Oxygen Saturation is 100% on RA, normal by my interpretation.    COORDINATION OF CARE: 8:31 AM- Pt advised of plan for treatment and pt agrees.  Results for orders placed during the hospital encounter of 11/26/12  CBC WITH DIFFERENTIAL      Result Value Range   WBC 6.5  4.0 - 10.5 K/uL   RBC 4.78  3.87 - 5.11 MIL/uL   Hemoglobin 15.2 (*) 12.0 -  15.0 g/dL   HCT 16.1  09.6 - 04.5 %   MCV 89.3  78.0 - 100.0 fL   MCH 31.8  26.0 - 34.0 pg   MCHC 35.6  30.0 - 36.0 g/dL   RDW 40.9  81.1 - 91.4 %   Platelets 179  150 - 400 K/uL   Neutrophils Relative 86 (*) 43 - 77 %   Neutro Abs 5.6  1.7 - 7.7 K/uL   Lymphocytes Relative 11 (*) 12 - 46 %   Lymphs Abs 0.7  0.7 - 4.0 K/uL   Monocytes Relative 2 (*) 3 - 12 %   Monocytes Absolute 0.1  0.1 - 1.0 K/uL   Eosinophils Relative 0  0 - 5 %   Eosinophils Absolute 0.0  0.0 - 0.7 K/uL   Basophils Relative 1  0 - 1 %   Basophils Absolute 0.0  0.0 - 0.1 K/uL  COMPREHENSIVE METABOLIC PANEL      Result Value Range   Sodium 136  135 - 145 mEq/L   Potassium 3.7  3.5 - 5.1 mEq/L   Chloride 100  96 - 112 mEq/L   CO2 23  19 - 32 mEq/L   Glucose, Bld 126 (*) 70 - 99 mg/dL   BUN 4 (*) 6 - 23 mg/dL   Creatinine, Ser 7.82  0.50 - 1.10 mg/dL   Calcium 95.6  8.4 - 21.3 mg/dL   Total Protein 9.1 (*) 6.0 - 8.3 g/dL   Albumin 4.4  3.5 - 5.2 g/dL  AST 31  0 - 37 U/L   ALT 33  0 - 35 U/L   Alkaline Phosphatase 76  39 - 117 U/L   Total Bilirubin 1.0  0.3 - 1.2 mg/dL   GFR calc non Af Amer >90  >90 mL/min   GFR calc Af Amer >90  >90 mL/min  LIPASE, BLOOD      Result Value Range   Lipase 20  11 - 59 U/L     Dg Abd Acute W/chest  11/26/2012  *RADIOLOGY REPORT*  Clinical Data: Emesis  ACUTE ABDOMEN SERIES (ABDOMEN 2 VIEW & CHEST 1 VIEW)  Comparison: 11/09/2012; 09/26/2012; CT abdomen pelvis - 07/17/2012  Findings:  Grossly unchanged cardiac silhouette and mediastinal contours.  The lungs remain hyperexpanded with flattening of bilateral hemidiaphragms and mild diffuse thickening of the interstitium. Nipple shadows again overlying the bilateral mid lungs.  No definite pleural effusion or pneumothorax.  There is an overall paucity of bowel gas with mild gaseous distension of several featureless loops of presumable small bowel within the mid hemiabdomen.  No pneumoperitoneum, pneumatosis or portal venous gas.   Suspected partially calcified fibroid within the right mid hemi pelvis.  Several phleboliths overlie the pelvis.  No additional abnormal intra-abdominal calcifications.  Mild to moderate bilateral hip degenerative change.  IMPRESSION:  1.  Paucity of bowel gas precludes evaluation for obstruction. Mild gaseous distension of a rather featureless loop of presumable small bowel within the mid hemiabdomen.  Further evaluation with abdominal CT may be obtained as clinically indicated.  2.  Hyperexpanded lungs and bronchitic change without acute cardiopulmonary disease.  3. Suspected partially calcified uterine fibroid overlies the right hemi pelvis.   Original Report Authenticated By: Tacey Ruiz, MD      1. Abdominal pain   2. Gastroenteritis       MDM   Patient with history of chronic abdominal pain with acute episodes associated with nausea vomiting and diarrhea. This is very similar to prior visits as this were reviewed. Today however patient's electrolytes are of very normal. No leukocytosis. No hypokalemia. No liver function test abnormalities. Patient significantly improved with Zofran hydromorphone in the emergency department. She has followup with GI to planning a colonoscopy she also has followup with primary care Dr. Reesa Chew discharged home with Phenergan and Zofran and hydrocodone.  Patient without acute surgical abdomen.         I personally performed the services described in this documentation, which was scribed in my presence. The recorded information has been reviewed and is accurate.      Shelda Jakes, MD 11/26/12 1007

## 2012-11-26 NOTE — ED Notes (Signed)
Pt c/o n/v/d since 9pm last night. Denies black/bloody emesis or stool. Slight gen weakness noted. Pt c/o lower abd pain. Sx's started last night. Alert/oriented.

## 2012-11-28 ENCOUNTER — Ambulatory Visit (INDEPENDENT_AMBULATORY_CARE_PROVIDER_SITE_OTHER): Payer: Medicaid Other | Admitting: Orthopedic Surgery

## 2012-11-28 ENCOUNTER — Encounter: Payer: Self-pay | Admitting: Orthopedic Surgery

## 2012-11-28 VITALS — BP 102/60 | Ht 66.0 in | Wt 126.0 lb

## 2012-11-28 DIAGNOSIS — M549 Dorsalgia, unspecified: Secondary | ICD-10-CM

## 2012-11-28 MED ORDER — HYDROCODONE-ACETAMINOPHEN 5-325 MG PO TABS: 1.0000 | ORAL_TABLET | ORAL | Status: AC | PRN

## 2012-11-28 MED ORDER — IBUPROFEN 800 MG PO TABS: 800.0000 mg | ORAL_TABLET | Freq: Three times a day (TID) | ORAL | Status: DC | PRN

## 2012-11-28 MED ORDER — METHOCARBAMOL 500 MG PO TABS: 500.0000 mg | ORAL_TABLET | Freq: Three times a day (TID) | ORAL | Status: DC

## 2012-11-28 NOTE — Patient Instructions (Signed)
Continue current medications. 

## 2012-11-28 NOTE — Progress Notes (Signed)
Patient ID: Colleen Lee, female   DOB: 26-Aug-1955, 57 y.o.   MRN: [id number]   History of back pain  6 week recheck we started Robaxin she is on ibuprofen and Norco pain is controlled  Denies any neurovascular complaints at this time  Lumbar spine range of motion is decreased and she has tenderness there stability and strength in the lower extremities is normal she has good distal pulses she is awake and alert she is oriented BP 102/60  Ht 5\' 6"  (1.676 m)  Wt 126 lb (57.153 kg)  BMI 20.35 kg/m2  Back pain - Plan: HYDROcodone-acetaminophen (NORCO/VICODIN) 5-325 MG per tablet, methocarbamol (ROBAXIN) 500 MG tablet, ibuprofen (ADVIL,MOTRIN) 800 MG tablet  Acute back pain   Continue current medications followup 2 months

## 2012-11-30 ENCOUNTER — Telehealth: Payer: Self-pay | Admitting: Gastroenterology

## 2012-11-30 NOTE — Telephone Encounter (Signed)
She can be referred over to [facility] and they can consider a second attempt at complete colonoscopy. We will be glad to make a referral if she desires.

## 2012-11-30 NOTE — Telephone Encounter (Signed)
Alvino Chapel from [facility] has called and told me that Ms. [employer] insurance is not going to pay for her to have the Virtual Colonoscopy and Ms. Dorion is asking what is her next step? Please advise?

## 2012-11-30 NOTE — Telephone Encounter (Signed)
I have Left a Mess for Ms. Legner to return my call

## 2012-12-01 NOTE — Telephone Encounter (Signed)
Referral has been faxed to [facility] per Mrs. Hockadays request

## 2012-12-05 ENCOUNTER — Other Ambulatory Visit: Payer: Medicaid Other

## 2012-12-05 NOTE — Telephone Encounter (Signed)
Routing to [place] to nic appt with RMR.

## 2012-12-05 NOTE — Telephone Encounter (Signed)
Thank you.  We should still have patient come back and see RMR, nonurgent, sometime in 12/2012 for f/u weight loss, mildly dilated pancreatic duct.

## 2012-12-05 NOTE — Telephone Encounter (Signed)
Reminder in epic °

## 2013-01-30 ENCOUNTER — Encounter: Payer: Self-pay | Admitting: Orthopedic Surgery

## 2013-01-30 ENCOUNTER — Ambulatory Visit (INDEPENDENT_AMBULATORY_CARE_PROVIDER_SITE_OTHER): Payer: Medicare Other | Admitting: Orthopedic Surgery

## 2013-01-30 VITALS — BP 154/84 | Ht 66.0 in | Wt 126.0 lb

## 2013-01-30 DIAGNOSIS — R52 Pain, unspecified: Secondary | ICD-10-CM

## 2013-01-30 DIAGNOSIS — M549 Dorsalgia, unspecified: Secondary | ICD-10-CM

## 2013-01-30 MED ORDER — METHOCARBAMOL 500 MG PO TABS: 500.0000 mg | ORAL_TABLET | Freq: Three times a day (TID) | ORAL | Status: DC

## 2013-01-30 MED ORDER — HYDROCODONE-ACETAMINOPHEN 5-325 MG PO TABS: 1.0000 | ORAL_TABLET | ORAL | Status: AC | PRN

## 2013-01-30 NOTE — Progress Notes (Signed)
Patient ID: Colleen Lee, female   DOB: 1955-09-14, 57 y.o.   MRN: [id number] Chief Complaint  Patient presents with  . Follow-up    2 month follow up back pain   BP 154/84  Ht 5\' 6"  (1.676 m)  Wt 126 lb (57.153 kg)  BMI 20.35 kg/m2 Encounter Diagnoses  Name Primary?  . Back pain Yes  . Pain     Followup patient has lumbar disc-like symptoms with the following MRI IMPRESSION:  1. Mild degenerative disc disease without central canal or  foraminal stenosis.  2. Decreased marrow signal is nonspecific but could be due to  cigarette smoking. No focal marrow lesion is identified.  She is responding well to Robaxin and Norco we will continue with that she declined epidural steroid injections. No bowel or bladder symptoms right now.  General appearance is normal, the patient is alert and oriented x3 with normal mood and affect. BP 154/84  Ht 5\' 6"  (1.676 m)  Wt 126 lb (57.153 kg)  BMI 20.35 kg/m2 She can dorsiflex and plantarflex the foot has some decreased sensation L5 distribution knee hip and ankle stable muscle tone normal good strength and dorsiflexion skin intact  Impression as noted left lumbar radicular pain continue Norco and Robaxin followup in a month

## 2013-02-14 ENCOUNTER — Encounter (HOSPITAL_COMMUNITY): Payer: Self-pay

## 2013-02-14 ENCOUNTER — Emergency Department (HOSPITAL_COMMUNITY)
Admission: EM | Admit: 2013-02-14 | Discharge: 2013-02-15 | Disposition: A | Discharge status: Home / Self Care | Payer: Medicare Other | Attending: Emergency Medicine | Admitting: Emergency Medicine

## 2013-02-14 DIAGNOSIS — R6883 Chills (without fever): Secondary | ICD-10-CM | POA: Insufficient documentation

## 2013-02-14 DIAGNOSIS — Z8742 Personal history of other diseases of the female genital tract: Secondary | ICD-10-CM | POA: Insufficient documentation

## 2013-02-14 DIAGNOSIS — R1110 Vomiting, unspecified: Secondary | ICD-10-CM

## 2013-02-14 DIAGNOSIS — Z8619 Personal history of other infectious and parasitic diseases: Secondary | ICD-10-CM | POA: Insufficient documentation

## 2013-02-14 DIAGNOSIS — Z79899 Other long term (current) drug therapy: Secondary | ICD-10-CM | POA: Insufficient documentation

## 2013-02-14 DIAGNOSIS — R112 Nausea with vomiting, unspecified: Secondary | ICD-10-CM | POA: Insufficient documentation

## 2013-02-14 DIAGNOSIS — K219 Gastro-esophageal reflux disease without esophagitis: Secondary | ICD-10-CM | POA: Insufficient documentation

## 2013-02-14 DIAGNOSIS — I10 Essential (primary) hypertension: Secondary | ICD-10-CM | POA: Insufficient documentation

## 2013-02-14 DIAGNOSIS — R1013 Epigastric pain: Secondary | ICD-10-CM | POA: Insufficient documentation

## 2013-02-14 DIAGNOSIS — F17200 Nicotine dependence, unspecified, uncomplicated: Secondary | ICD-10-CM | POA: Insufficient documentation

## 2013-02-14 DIAGNOSIS — R197 Diarrhea, unspecified: Secondary | ICD-10-CM | POA: Insufficient documentation

## 2013-02-14 LAB — LIPASE, BLOOD: Lipase: 27 U/L (ref 11–59)

## 2013-02-14 LAB — CBC WITH DIFFERENTIAL/PLATELET
Basophils Absolute: 0 10*3/uL (ref 0.0–0.1)
Basophils Relative: 1 % (ref 0–1)
Eosinophils Absolute: 0.1 10*3/uL (ref 0.0–0.7)
Eosinophils Relative: 1 % (ref 0–5)
HCT: 42.6 % (ref 36.0–46.0)
Hemoglobin: 15.3 g/dL — ABNORMAL HIGH (ref 12.0–15.0)
Lymphocytes Relative: 11 % — ABNORMAL LOW (ref 12–46)
Lymphs Abs: 1 10*3/uL (ref 0.7–4.0)
MCH: 32.1 pg (ref 26.0–34.0)
MCHC: 35.9 g/dL (ref 30.0–36.0)
MCV: 89.5 fL (ref 78.0–100.0)
Monocytes Absolute: 0.3 10*3/uL (ref 0.1–1.0)
Monocytes Relative: 4 % (ref 3–12)
Neutro Abs: 6.9 10*3/uL (ref 1.7–7.7)
Neutrophils Relative %: 83 % — ABNORMAL HIGH (ref 43–77)
Platelets: 161 10*3/uL (ref 150–400)
RBC: 4.76 MIL/uL (ref 3.87–5.11)
RDW: 13.8 % (ref 11.5–15.5)
WBC: 8.3 10*3/uL (ref 4.0–10.5)

## 2013-02-14 LAB — COMPREHENSIVE METABOLIC PANEL
ALT: 22 U/L (ref 0–35)
AST: 20 U/L (ref 0–37)
Albumin: 4 g/dL (ref 3.5–5.2)
Alkaline Phosphatase: 61 U/L (ref 39–117)
BUN: 5 mg/dL — ABNORMAL LOW (ref 6–23)
CO2: 20 mEq/L (ref 19–32)
Calcium: 10.2 mg/dL (ref 8.4–10.5)
Chloride: 107 mEq/L (ref 96–112)
Creatinine, Ser: 0.53 mg/dL (ref 0.50–1.10)
GFR calc Af Amer: >90 mL/min (ref >90)
GFR calc non Af Amer: >90 mL/min (ref >90)
Glucose, Bld: 111 mg/dL — ABNORMAL HIGH (ref 70–99)
Potassium: 3.3 mEq/L — ABNORMAL LOW (ref 3.5–5.1)
Sodium: 138 mEq/L (ref 135–145)
Total Bilirubin: 0.8 mg/dL (ref 0.3–1.2)
Total Protein: 8.3 g/dL (ref 6.0–8.3)

## 2013-02-14 MED ORDER — PANTOPRAZOLE SODIUM 40 MG IV SOLR
40.0000 mg | Freq: Once | INTRAVENOUS | Status: AC
  Administered 2013-02-14: 40 mg via INTRAVENOUS
  Filled 2013-02-14: qty 40

## 2013-02-14 MED ORDER — LORAZEPAM 2 MG/ML IJ SOLN
1.0000 mg | Freq: Once | INTRAMUSCULAR | Status: AC
  Administered 2013-02-14: 1 mg via INTRAVENOUS
  Filled 2013-02-14: qty 1

## 2013-02-14 MED ORDER — HYDROCODONE-ACETAMINOPHEN 5-325 MG PO TABS: 1.0000 | ORAL_TABLET | Freq: Four times a day (QID) | ORAL | Status: DC | PRN

## 2013-02-14 MED ORDER — ONDANSETRON HCL 4 MG/2ML IJ SOLN
4.0000 mg | Freq: Once | INTRAMUSCULAR | Status: AC
  Administered 2013-02-14: 4 mg via INTRAVENOUS
  Filled 2013-02-14: qty 2

## 2013-02-14 MED ORDER — HYDROMORPHONE HCL PF 1 MG/ML IJ SOLN
1.0000 mg | Freq: Once | INTRAMUSCULAR | Status: AC
  Administered 2013-02-14: 1 mg via INTRAVENOUS
  Filled 2013-02-14: qty 1

## 2013-02-14 MED ORDER — SODIUM CHLORIDE 0.9 % IV BOLUS (SEPSIS)
1000.0000 mL | Freq: Once | INTRAVENOUS | Status: AC
  Administered 2013-02-14: 1000 mL via INTRAVENOUS

## 2013-02-14 MED ORDER — PROMETHAZINE HCL 25 MG RE SUPP: 25.0000 mg | Freq: Four times a day (QID) | RECTAL | Status: DC | PRN

## 2013-02-14 MED ORDER — POTASSIUM CHLORIDE CRYS ER 20 MEQ PO TBCR
40.0000 meq | EXTENDED_RELEASE_TABLET | Freq: Once | ORAL | Status: AC
  Administered 2013-02-14: 40 meq via ORAL
  Filled 2013-02-14: qty 2

## 2013-02-14 NOTE — ED Notes (Signed)
Dr Zammit at bedside,  

## 2013-02-14 NOTE — ED Notes (Signed)
Abdominal pain, nausea and vomiting per pt. Started this morning per pt.

## 2013-02-14 NOTE — ED Notes (Signed)
Pt c/o n/v abd pain, chills that started yesterday, worse today, has been taking phenergan without any relief in n/v.

## 2013-02-14 NOTE — ED Provider Notes (Signed)
History    This chart was scribed for Colleen Lennert, MD by Colleen Lee, ED Scribe. This patient was seen in room [facility] and the patient's care was started at 2208.   CSN: [id number] Arrival date & time 02/14/13  2158  First MD Initiated Contact with Patient 02/14/13 2208     Chief Complaint  Patient presents with  . Abdominal Pain  . Emesis    Patient is a 57 y.o. female presenting with abdominal pain and vomiting. The history is provided by the patient. No language interpreter was used.  Abdominal Pain This is a recurrent problem. The current episode started 12 to 24 hours ago. The problem has not changed since onset.Associated symptoms include abdominal pain. Pertinent negatives include no chest pain and no headaches. The symptoms are aggravated by eating and drinking.  Emesis Severity:  Moderate Duration:  1 day Timing:  Intermittent Number of daily episodes:  Several Progression:  Unchanged Chronicity:  Recurrent Context: not post-tussive and not self-induced   Relieved by:  None tried Worsened by:  Food smell Ineffective treatments:  Antiemetics Associated symptoms: abdominal pain and chills   Associated symptoms: no diarrhea and no headaches    HPI Comments: Colleen Lee is a 57 y.o. female who presents to the Emergency Department complaining of intermittent, non changing nausea, vomiting, diarrhea, abdominal pain and chills that began this morning. She has tried Phenergan with no relief. Dr. Lovena Lee is her GI physician. She has a colonoscopy scheduled 2 days from now.  Past Medical History  Diagnosis Date  . Hypertension   . Fibroids   . [name]     HCV RNA positive 09/2012  . GERD (gastroesophageal reflux disease)   . H. pylori infection 2014    treated with pylera   Past Surgical History  Procedure Laterality Date  . Knee surgery      left knee  . Colonoscopy  01/18/2008    NGE:XBMWUX rectum.  Long redundant colon, a diminutive sigmoid polyp  status post cold biopsy removed. Hyperplastic polyp. Repeat colonoscopy June 2014 due to family history of colon cancer  . Esophagogastroduodenoscopy  01/18/2008    RMR: Normal esophagus, normal  stomach  . Colonoscopy with esophagogastroduodenoscopy (egd) N/A 11/02/2012    LKG:[id number] gastric mucosa of doubtful, +H.pylori. Incomplete colonoscopy due to patient unable to tolerate exam, proximal colon seen. Patient refused ACBE.   Family History  Problem Relation Age of Onset  . Colon cancer Brother        . Asthma      FH  . Multiple myeloma Brother   . Liver cancer Sister   . Prostate cancer Brother   . Pancreatic cancer Brother   . Cancer Other     niece, age 37, primary unknown   History  Substance Use Topics  . Smoking status: Current Every Day Smoker -- 0.50 packs/day    Types: Cigarettes  . Smokeless tobacco: Not on file  . Alcohol Use: Yes     Comment: former, no etoh since jan 2014   OB History   Grav Para Term Preterm Abortions TAB SAB Ect Mult Living                 Review of Systems  Constitutional: Positive for chills. Negative for appetite change and fatigue.  HENT: Negative for congestion, sinus pressure and ear discharge.   Eyes: Negative for discharge.  Respiratory: Negative for cough.   Cardiovascular: Negative for chest pain.  Gastrointestinal: Positive  for nausea, vomiting and abdominal pain. Negative for diarrhea.  Genitourinary: Negative for frequency and hematuria.  Musculoskeletal: Negative for back pain.  Skin: Negative for rash.  Neurological: Negative for seizures and headaches.  Psychiatric/Behavioral: Negative for hallucinations.    Allergies  Penicillins  Home Medications   Current Outpatient Rx  Name  Route  Sig  Dispense  Refill  . bismuth-metronidazole-tetracycline (PYLERA) 140-125-125 MG per capsule   Oral   Take 3 capsules by mouth 4 (four) times daily -  before meals and at bedtime.   120 capsule   0   . Cyanocobalamin  (VITAMIN B 12 PO)   Oral   Take 1 tablet by mouth daily.         Marland Kitchen gabapentin (NEURONTIN) 100 MG capsule   Oral   Take 1 capsule (100 mg total) by mouth 3 (three) times daily.   90 capsule   5   . ibuprofen (ADVIL,MOTRIN) 800 MG tablet   Oral   Take 1 tablet (800 mg total) by mouth every 8 (eight) hours as needed for pain.   90 tablet   5   . lisinopril (PRINIVIL,ZESTRIL) 10 MG tablet   Oral   Take 10 mg by mouth daily.         . megestrol (MEGACE) 40 MG/ML suspension   Oral   Take 200 mg by mouth daily.         . methocarbamol (ROBAXIN) 500 MG tablet   Oral   Take 1 tablet (500 mg total) by mouth 3 (three) times daily.   60 tablet   1   . omeprazole (PRILOSEC) 20 MG capsule   Oral   Take 1 capsule (20 mg total) by mouth daily.   30 capsule   11   . ondansetron (ZOFRAN ODT) 4 MG disintegrating tablet   Oral   Take 1 tablet (4 mg total) by mouth every 8 (eight) hours as needed for nausea.   15 tablet   0   . ondansetron (ZOFRAN-ODT) 8 MG disintegrating tablet   Oral   Take 8 mg by mouth every 8 (eight) hours as needed for nausea.         . promethazine (PHENERGAN) 25 MG tablet   Oral   Take 1 tablet (25 mg total) by mouth every 6 (six) hours as needed for nausea.   20 tablet   0    BP 164/79  Pulse 68  Temp(Src) 97.6 F (36.4 C) (Oral)  Resp 20  Ht 5\' 6"  (1.676 m)  Wt 123 lb (55.792 kg)  BMI 19.86 kg/m2  SpO2 100%  Physical Exam  Constitutional: She is oriented to person, place, and time. She appears well-developed.  HENT:  Head: Normocephalic.  Eyes: Conjunctivae and EOM are normal. No scleral icterus.  Neck: Neck supple. No thyromegaly present.  Cardiovascular: Normal rate and regular rhythm.  Exam reveals no gallop and no friction rub.   No murmur heard. Pulmonary/Chest: No stridor. She has no wheezes. She has no rales. She exhibits no tenderness.  Abdominal: She exhibits no distension. There is tenderness. There is no rebound.  Mild  epigastric tenderness  Musculoskeletal: Normal range of motion. She exhibits no edema.  Lymphadenopathy:    She has no cervical adenopathy.  Neurological: She is oriented to person, place, and time. Coordination normal.  Skin: No rash noted. No erythema.  Psychiatric: She has a normal mood and affect. Her behavior is normal.    ED Course  Procedures (including  critical care time) DIAGNOSTIC STUDIES: Oxygen Saturation is 100% on RA, normal by my interpretation.    COORDINATION OF CARE: 10:08 PM Discussed treatment plan which includes IV fluids, Dilaudid, Ativan, Zofran, Protonix, CBC with differential, comprehensive metabolic panel and lipase with pt at bedside and pt agreed to plan.    No diagnosis found.  MDM  Persistent vomiting and abd pain.  Pt to have colonoscopy Friday.  Gi md caring for her The chart was scribed for me under my direct supervision.  I personally performed the history, physical, and medical decision making and all procedures in the evaluation of this patient.Colleen Lennert, MD 02/17/13 [id number]

## 2013-02-15 NOTE — ED Notes (Signed)
Pt tolerating gingerale well,

## 2013-02-16 ENCOUNTER — Emergency Department (HOSPITAL_COMMUNITY): Payer: Medicare Other

## 2013-02-16 ENCOUNTER — Encounter (HOSPITAL_COMMUNITY): Payer: Self-pay | Admitting: Family Medicine

## 2013-02-16 ENCOUNTER — Emergency Department (HOSPITAL_COMMUNITY)
Admission: EM | Admit: 2013-02-16 | Discharge: 2013-02-16 | Disposition: A | Discharge status: Home / Self Care | Payer: Medicare Other | Attending: Emergency Medicine | Admitting: Emergency Medicine

## 2013-02-16 DIAGNOSIS — R109 Unspecified abdominal pain: Secondary | ICD-10-CM | POA: Insufficient documentation

## 2013-02-16 DIAGNOSIS — Z8742 Personal history of other diseases of the female genital tract: Secondary | ICD-10-CM | POA: Insufficient documentation

## 2013-02-16 DIAGNOSIS — K219 Gastro-esophageal reflux disease without esophagitis: Secondary | ICD-10-CM | POA: Insufficient documentation

## 2013-02-16 DIAGNOSIS — K5900 Constipation, unspecified: Secondary | ICD-10-CM | POA: Insufficient documentation

## 2013-02-16 DIAGNOSIS — G8929 Other chronic pain: Secondary | ICD-10-CM | POA: Insufficient documentation

## 2013-02-16 DIAGNOSIS — R112 Nausea with vomiting, unspecified: Secondary | ICD-10-CM | POA: Insufficient documentation

## 2013-02-16 DIAGNOSIS — R61 Generalized hyperhidrosis: Secondary | ICD-10-CM | POA: Insufficient documentation

## 2013-02-16 DIAGNOSIS — Z8619 Personal history of other infectious and parasitic diseases: Secondary | ICD-10-CM | POA: Insufficient documentation

## 2013-02-16 DIAGNOSIS — Z79899 Other long term (current) drug therapy: Secondary | ICD-10-CM | POA: Insufficient documentation

## 2013-02-16 DIAGNOSIS — Z880 Allergy status to penicillin: Secondary | ICD-10-CM | POA: Insufficient documentation

## 2013-02-16 DIAGNOSIS — I10 Essential (primary) hypertension: Secondary | ICD-10-CM | POA: Insufficient documentation

## 2013-02-16 DIAGNOSIS — F17200 Nicotine dependence, unspecified, uncomplicated: Secondary | ICD-10-CM | POA: Insufficient documentation

## 2013-02-16 DIAGNOSIS — R630 Anorexia: Secondary | ICD-10-CM | POA: Insufficient documentation

## 2013-02-16 LAB — URINE MICROSCOPIC-ADD ON

## 2013-02-16 LAB — CBC
HCT: 46.2 % — ABNORMAL HIGH (ref 36.0–46.0)
Hemoglobin: 17.1 g/dL — ABNORMAL HIGH (ref 12.0–15.0)
MCH: 32.3 pg (ref 26.0–34.0)
MCHC: 37 g/dL — ABNORMAL HIGH (ref 30.0–36.0)
MCV: 87.2 fL (ref 78.0–100.0)
Platelets: 163 10*3/uL (ref 150–400)
RBC: 5.3 MIL/uL — ABNORMAL HIGH (ref 3.87–5.11)
RDW: 13.7 % (ref 11.5–15.5)
WBC: 10.6 10*3/uL — ABNORMAL HIGH (ref 4.0–10.5)

## 2013-02-16 LAB — RAPID URINE DRUG SCREEN, HOSP PERFORMED
Amphetamines: NOT DETECTED
Barbiturates: NOT DETECTED
Benzodiazepines: POSITIVE — AB
Cocaine: NOT DETECTED
Opiates: POSITIVE — AB
Tetrahydrocannabinol: POSITIVE — AB

## 2013-02-16 LAB — BASIC METABOLIC PANEL
BUN: 14 mg/dL (ref 6–23)
CO2: 22 mEq/L (ref 19–32)
Calcium: 11.7 mg/dL — ABNORMAL HIGH (ref 8.4–10.5)
Chloride: 98 mEq/L (ref 96–112)
Creatinine, Ser: 0.7 mg/dL (ref 0.50–1.10)
GFR calc Af Amer: >90 mL/min (ref >90)
GFR calc non Af Amer: >90 mL/min (ref >90)
Glucose, Bld: 107 mg/dL — ABNORMAL HIGH (ref 70–99)
Potassium: 3.9 mEq/L (ref 3.5–5.1)
Sodium: 133 mEq/L — ABNORMAL LOW (ref 135–145)

## 2013-02-16 LAB — URINALYSIS, ROUTINE W REFLEX MICROSCOPIC
Glucose, UA: NEGATIVE mg/dL
Hgb urine dipstick: NEGATIVE
Ketones, ur: NEGATIVE mg/dL
Nitrite: NEGATIVE
Protein, ur: 100 mg/dL — AB
Specific Gravity, Urine: 1.022 (ref 1.005–1.030)
Urobilinogen, UA: 1 mg/dL (ref 0.0–1.0)
pH: 6 (ref 5.0–8.0)

## 2013-02-16 MED ORDER — SODIUM CHLORIDE 0.9 % IV BOLUS (SEPSIS)
1000.0000 mL | Freq: Once | INTRAVENOUS | Status: AC
  Administered 2013-02-16: 1000 mL via INTRAVENOUS

## 2013-02-16 MED ORDER — ONDANSETRON 4 MG PO TBDP
8.0000 mg | ORAL_TABLET | Freq: Once | ORAL | Status: AC
  Administered 2013-02-16: 8 mg via ORAL
  Filled 2013-02-16: qty 2

## 2013-02-16 MED ORDER — MORPHINE SULFATE 4 MG/ML IJ SOLN
4.0000 mg | Freq: Once | INTRAMUSCULAR | Status: AC
  Administered 2013-02-16: 4 mg via INTRAVENOUS
  Filled 2013-02-16: qty 1

## 2013-02-16 MED ORDER — KETOROLAC TROMETHAMINE 30 MG/ML IJ SOLN
30.0000 mg | Freq: Once | INTRAMUSCULAR | Status: AC
  Administered 2013-02-16: 30 mg via INTRAVENOUS
  Filled 2013-02-16: qty 1

## 2013-02-16 NOTE — ED Notes (Signed)
Per pt sts abdominal pain, N,V. Recently seen in the ER. sts was suppose to have colonoscopy today but unable due to not feeling well. Last BM 2 days ago. sts dark stool. sts this has been going on for a while intermittently.

## 2013-02-16 NOTE — ED Notes (Signed)
Notified PA that patients states her pain has not improved with medication and is requesting something different.

## 2013-02-16 NOTE — ED Notes (Signed)
IV Team notified of patients need for access.

## 2013-02-16 NOTE — ED Provider Notes (Signed)
History    CSN: [id number] Arrival date & time 02/16/13  1137  First MD Initiated Contact with Patient 02/16/13 1351     Chief Complaint  Patient presents with  . Abdominal Pain  . Nausea   (Consider location/radiation/quality/duration/timing/severity/associated sxs/prior Treatment) HPI Pt is a 56yo with chronic abdominal pain and vomiting.  Has been seen 5 times in the last 54mo for same.  Pt normally goes to [facility] for medical care, was last seen by Dr. Preston Fleeting on 7/16 for same.  Was given norco and phenergan suppository, discharged home and was scheduled to f/u with GI for colonoscopy today 7/18.  Pt reported to ED stating she felt too bad to go have colonoscopy done.  States she has been vomiting and dry heaving too much to keep any medication down.  States suppository did not help relieve symptoms.  Pain in abdomen is diffuse, cramping, sharp, 10/10, nothing makes it better.  Denies fevers but states she has been sweating.  Denies urinary symptoms.    Past Medical History  Diagnosis Date  . Hypertension   . Fibroids   . [name]     HCV RNA positive 09/2012  . GERD (gastroesophageal reflux disease)   . H. pylori infection 2014    treated with pylera   Past Surgical History  Procedure Laterality Date  . Knee surgery      left knee  . Colonoscopy  01/18/2008    ZHY:[id number] rectum.  Long redundant colon, a diminutive sigmoid polyp status post cold biopsy removed. Hyperplastic polyp. Repeat colonoscopy June 2014 due to family history of colon cancer  . Esophagogastroduodenoscopy  01/18/2008    RMR: Normal esophagus, normal  stomach  . Colonoscopy with esophagogastroduodenoscopy (egd) N/A 11/02/2012    ONG:EXBMWUXL gastric mucosa of doubtful, +H.pylori. Incomplete colonoscopy due to patient unable to tolerate exam, proximal colon seen. Patient refused ACBE.   Family History  Problem Relation Age of Onset  . Colon cancer Brother        . Asthma      FH  . Multiple myeloma Brother    . Liver cancer Sister   . Prostate cancer Brother   . Pancreatic cancer Brother   . Cancer Other     niece, age 37, primary unknown   History  Substance Use Topics  . Smoking status: Current Every Day Smoker -- 0.50 packs/day    Types: Cigarettes  . Smokeless tobacco: Not on file  . Alcohol Use: Yes     Comment: former, no etoh since jan 2014   OB History   Grav Para Term Preterm Abortions TAB SAB Ect Mult Living                 Review of Systems  Constitutional: Positive for diaphoresis and appetite change ( decreased). Negative for fever, chills and unexpected weight change.  Respiratory: Negative for shortness of breath.   Cardiovascular: Negative for chest pain.  Gastrointestinal: Positive for nausea, vomiting, abdominal pain ( diffuse) and constipation. Negative for diarrhea.  Genitourinary: Negative for dysuria.  All other systems reviewed and are negative.    Allergies  Penicillins  Home Medications   Current Outpatient Rx  Name  Route  Sig  Dispense  Refill  . Cyanocobalamin (VITAMIN B 12 PO)   Oral   Take 1 tablet by mouth daily.         Marland Kitchen gabapentin (NEURONTIN) 100 MG capsule   Oral   Take 1 capsule (100 mg total) by  mouth 3 (three) times daily.   90 capsule   5   . HYDROcodone-acetaminophen (NORCO/VICODIN) 5-325 MG per tablet   Oral   Take 1 tablet by mouth every 6 (six) hours as needed for pain.   20 tablet   0   . lisinopril (PRINIVIL,ZESTRIL) 10 MG tablet   Oral   Take 10 mg by mouth daily.         . megestrol (MEGACE) 40 MG/ML suspension   Oral   Take 200 mg by mouth daily.         . methocarbamol (ROBAXIN) 500 MG tablet   Oral   Take 1 tablet (500 mg total) by mouth 3 (three) times daily.   60 tablet   1   . omeprazole (PRILOSEC) 20 MG capsule   Oral   Take 1 capsule (20 mg total) by mouth daily.   30 capsule   11   . promethazine (PHENERGAN) 25 MG suppository   Rectal   Place 1 suppository (25 mg total) rectally every  6 (six) hours as needed for nausea.   12 each   0   . promethazine (PHENERGAN) 25 MG tablet   Oral   Take 1 tablet (25 mg total) by mouth every 6 (six) hours as needed for nausea.   20 tablet   0    BP 153/113  Pulse 77  Temp(Src) 97.7 F (36.5 C)  Resp 16  SpO2 100% Physical Exam  Nursing note and vitals reviewed. Constitutional: She appears well-developed and well-nourished.  Pt sitting comfortably in exam bed, NAD.     HENT:  Head: Normocephalic and atraumatic.  Eyes: Conjunctivae are normal. No scleral icterus.  Neck: Normal range of motion.  Cardiovascular: Normal rate, regular rhythm and normal heart sounds.   Pulmonary/Chest: Effort normal and breath sounds normal. No respiratory distress. She has no wheezes. She has no rales. She exhibits no tenderness.  Abdominal: Soft. Bowel sounds are normal. She exhibits no distension and no mass. There is tenderness ( mild tenderness). There is no rebound and no guarding.  Musculoskeletal: Normal range of motion.  Neurological: She is alert.  Skin: Skin is warm and dry.    ED Course  Procedures (including critical care time) Labs Reviewed  CBC - Abnormal; Notable for the following:    WBC 10.6 (*)    RBC 5.30 (*)    Hemoglobin 17.1 (*)    HCT 46.2 (*)    MCHC 37.0 (*)    All other components within normal limits  BASIC METABOLIC PANEL - Abnormal; Notable for the following:    Sodium 133 (*)    Glucose, Bld 107 (*)    Calcium 11.7 (*)    All other components within normal limits  URINALYSIS, ROUTINE W REFLEX MICROSCOPIC  URINE RAPID DRUG SCREEN (HOSP PERFORMED)   Dg Abd 1 View  02/16/2013   *RADIOLOGY REPORT*  Clinical Data: Right side abdominal pain with nausea vomiting.  ABDOMEN - 1 VIEW  Comparison: 11/26/2012  Findings: Supine abdomen shows no gaseous bowel dilatation to suggest obstruction.  Bones are diffusely demineralized.  Coarse calcification overlying the right anatomic pelvis is compatible with mineralized  fibroid change in the uterus.  Phleboliths are seen along the pelvic sidewalls bilaterally.  IMPRESSION: Nonspecific bowel gas pattern.   Original Report Authenticated By: Kennith Center, M.D.   No diagnosis found.  MDM  Pt with chronic abd pain and vomiting presented with worsening symptoms, too bad to go to colonoscopy today.  Due to extensive workup at [facility] on 7/16, will get basic labs: CBC, BMP, UA, Urine drug and KUB.     Tx: toradol, zofran, fluids.   4:52 PM IV was able to be placed for fluids and additional medication to be given.  Will sign out pt to [place] PA-C at shift change.  Pt is to be discharged home with nausea and pain control.  Encouraged to f/u with GI as previously scheduled.        Junius Finner, PA-C 02/16/13 1652

## 2013-02-16 NOTE — ED Provider Notes (Signed)
4:40 PM  I assumed care of patient at change of shift from [facility].  Patient has chronic abdominal pain and vomiting, has been seen several times at [facility] ED for same, including two days ago in which her workup was unremarkable and she was sent home with #20 norco and phenergan.  Plan is for IVF, UA, urine drug screen, nausea meds, PO challenge.  Anticipate d/c home.   6:15 PM Pt reports she has had abdominal pain x 4 years that is similar to today's pain.  Abdominal exam: soft, nondistended, diffusely tender worse in lower area and RUQ, no guarding, no rebound.   7:10 PM Discussed UA results with patient.  Pt feeling better after morphine, tolerating PO.  Plan is for d/c home.  Pt has nausea medication at home.  She is to follow up with GI and with pain management. Pt given return precautions.  Pt verbalizes understanding and agrees with plan.      Trixie Dredge, PA-C 02/16/13 1911

## 2013-02-17 NOTE — ED Provider Notes (Signed)
Medical screening examination/treatment/procedure(s) were performed by non-physician practitioner and as supervising physician I was immediately available for consultation/collaboration.   Lavonne Kinderman III, MD 02/17/13 1048 

## 2013-02-17 NOTE — ED Provider Notes (Signed)
Medical screening examination/treatment/procedure(s) were performed by non-physician practitioner and as supervising physician I was immediately available for consultation/collaboration.   Tyronn Golda III, MD 02/17/13 1054 

## 2013-02-27 ENCOUNTER — Encounter: Payer: Self-pay | Admitting: Internal Medicine

## 2013-02-27 ENCOUNTER — Ambulatory Visit (INDEPENDENT_AMBULATORY_CARE_PROVIDER_SITE_OTHER): Payer: Medicare Other | Admitting: Internal Medicine

## 2013-02-27 VITALS — BP 145/85 | HR 65 | Temp 97.2°F | Ht 66.0 in | Wt 129.8 lb

## 2013-02-27 DIAGNOSIS — R1013 Epigastric pain: Secondary | ICD-10-CM

## 2013-02-27 DIAGNOSIS — G8929 Other chronic pain: Secondary | ICD-10-CM

## 2013-02-27 MED ORDER — PEG 3350-KCL-NA BICARB-NACL 420 G PO SOLR: 4000.0000 mL | ORAL | Status: DC

## 2013-02-27 NOTE — Patient Instructions (Addendum)
High risk screening colonoscopy / propofol  Linzess 145 micrograms daily for constipation

## 2013-02-27 NOTE — Progress Notes (Signed)
Primary Care Physician:  HOWARD, KEVIN, MD Primary Gastroenterologist:  Dr. Richards Pherigo  Pre-Procedure History & Physical: HPI:  Colleen Lee is a 57 y.o. female here for abdominal pain and constipation. Incomplete colonoscopy earlier this year. Did not tolerate an air contrast barium enema. Scheduled for a virtual colonoscopy but canceled because of financial issues. Scheduled to see the GI doctors at [facility] for colonoscopy this month. She canceled that appointment. Did not get her hepatitis B vaccine. She did take meds for H. pylori infection. States she's rescheduled appt at [facility] for colonoscopy next month but wants me to try again here. Drug screen positive for marijuana recently the ED. Chronic abdominal pain in the setting of constipation and chronic narcotic therapy.  Weight loss this year has been a concern but it's good to note that she gained about 9 pounds since her last office visit. Patient states she is eating better.  Past Medical History  Diagnosis Date  . Hypertension   . Fibroids   . [name]     HCV RNA positive 09/2012  . GERD (gastroesophageal reflux disease)   . H. pylori infection 2014    treated with pylera    Past Surgical History  Procedure Laterality Date  . Knee surgery      left knee  . Colonoscopy  01/18/2008    RMR:normal rectum.  Long redundant colon, a diminutive sigmoid polyp status post cold biopsy removed. Hyperplastic polyp. Repeat colonoscopy June 2014 due to family history of colon cancer  . Esophagogastroduodenoscopy  01/18/2008    RMR: Normal esophagus, normal  stomach  . Colonoscopy with esophagogastroduodenoscopy (egd) N/A 11/02/2012    RMR:Abnormal gastric mucosa of doubtful, +H.pylori. Incomplete colonoscopy due to patient unable to tolerate exam, proximal colon seen. Patient refused ACBE.    Prior to Admission medications   Medication Sig Start Date End Date Taking? Authorizing Provider  Cyanocobalamin (VITAMIN B 12 PO) Take 1 tablet by  mouth daily.   Yes Historical Provider, MD  gabapentin (NEURONTIN) 100 MG capsule Take 1 capsule (100 mg total) by mouth 3 (three) times daily. 07/04/12  Yes Stanley E Harrison, MD  HYDROcodone-acetaminophen (NORCO/VICODIN) 5-325 MG per tablet Take 1 tablet by mouth every 6 (six) hours as needed for pain. 02/14/13  Yes Joseph L Zammit, MD  lisinopril (PRINIVIL,ZESTRIL) 10 MG tablet Take 10 mg by mouth daily.   Yes Historical Provider, MD  megestrol (MEGACE) 40 MG/ML suspension Take 200 mg by mouth daily.   Yes Historical Provider, MD  methocarbamol (ROBAXIN) 500 MG tablet Take 1 tablet (500 mg total) by mouth 3 (three) times daily. 01/30/13  Yes Stanley E Harrison, MD  omeprazole (PRILOSEC) 20 MG capsule Take 1 capsule (20 mg total) by mouth daily. 10/25/12  Yes Leslie S Lewis, PA-C  promethazine (PHENERGAN) 25 MG suppository Place 1 suppository (25 mg total) rectally every 6 (six) hours as needed for nausea. 02/14/13  Yes Joseph L Zammit, MD  promethazine (PHENERGAN) 25 MG tablet Take 1 tablet (25 mg total) by mouth every 6 (six) hours as needed for nausea. 11/26/12  Yes Scott W. Zackowski, MD    Allergies as of 02/27/2013 - Review Complete 02/27/2013  Allergen Reaction Noted  . Penicillins Rash     Family History  Problem Relation Age of Onset  . Colon cancer Brother        . Asthma      FH  . Multiple myeloma Brother   . Liver cancer Sister   . Prostate   cancer Brother   . Pancreatic cancer Brother   . Cancer Other     niece, age 19, primary unknown    History   Social History  . Marital Status: Married    Spouse Name: N/A    Number of Children: 0  . Years of Education: N/A   Occupational History  . umemployed     Social History Main Topics  . Smoking status: Current Every Day Smoker -- 0.50 packs/day    Types: Cigarettes  . Smokeless tobacco: Not on file  . Alcohol Use: Yes     Comment: former, no etoh since jan 2014  . Drug Use: No     Comment: remote in 1970s  .  Sexually Active: No   Other Topics Concern  . Not on file   Social History Narrative  . No narrative on file    Review of Systems: See HPI, otherwise negative ROS  Physical Exam: BP 145/85  Pulse 65  Temp(Src) 97.2 F (36.2 C) (Oral)  Ht 5' 6" (1.676 m)  Wt 129 lb 12.8 oz (58.877 kg)  BMI 20.96 kg/m2 General:   Alert,  Well-developed, well-nourished, pleasant and cooperative in NAD Skin:  Intact without significant lesions or rashes. Eyes:  Sclera clear, no icterus.   Conjunctiva pink. Ears:  Normal auditory acuity. Nose:  No deformity, discharge,  or lesions. Mouth:  No deformity or lesions. Neck:  Supple; no masses or thyromegaly. No significant cervical adenopathy. Lungs:  Clear throughout to auscultation.   No wheezes, crackles, or rhonchi. No acute distress. Heart:  Regular rate and rhythm; no murmurs, clicks, rubs,  or gallops. Abdomen: Non-distended, normal bowel sounds.  Soft and nontender without appreciable mass or hepatosplenomegaly.  Pulses:  Normal pulses noted. Extremities:  Without clubbing or edema.  Impression/Plan:  57-year-old lady with chronic [name], chronic abdominal pain in the setting of constipation, positive family history of colon cancer in a first degree relative. She needs a colonoscopy. Chronic abdominal pain, in part, likely related to constipation.  Recent treatment for Helicobacter pylori Wants me to try to perform her colonoscopy once again. She will need propofol.  Recommendations: Will make my best attempt at completing a colonoscopy under propofol in the  near future. Will begin Linzess 145 mcg daily for constipation. Samples provided.  Noncompliance issue. - Reviewed with patient.  Patient urged to follow-up  in the [name] clinic.  The risks, benefits, limitations, alternatives and imponderables of colonoscopy have been reviewed with the patient. Questions have been answered. All parties are agreeable.  

## 2013-03-01 ENCOUNTER — Ambulatory Visit (INDEPENDENT_AMBULATORY_CARE_PROVIDER_SITE_OTHER): Payer: Medicare Other | Admitting: Orthopedic Surgery

## 2013-03-01 ENCOUNTER — Encounter (HOSPITAL_COMMUNITY): Payer: Self-pay | Admitting: Pharmacy Technician

## 2013-03-01 VITALS — BP 159/88 | Ht 66.0 in | Wt 126.0 lb

## 2013-03-01 DIAGNOSIS — IMO0002 Reserved for concepts with insufficient information to code with codable children: Secondary | ICD-10-CM

## 2013-03-01 DIAGNOSIS — M549 Dorsalgia, unspecified: Secondary | ICD-10-CM

## 2013-03-01 DIAGNOSIS — M5410 Radiculopathy, site unspecified: Secondary | ICD-10-CM

## 2013-03-01 MED ORDER — METHOCARBAMOL 500 MG PO TABS: 500.0000 mg | ORAL_TABLET | Freq: Three times a day (TID) | ORAL | Status: DC

## 2013-03-01 MED ORDER — HYDROCODONE-ACETAMINOPHEN 5-325 MG PO TABS: 1.0000 | ORAL_TABLET | Freq: Four times a day (QID) | ORAL | Status: DC | PRN

## 2013-03-01 NOTE — Patient Instructions (Addendum)
EPIDURAL SERIES Epidural Steroid Injection An epidural steroid injection is given to relieve pain in the neck, back, or legs. This procedure involves injecting a steroid and numbing medicine (local anesthetic) into the epidural space. The epidural space is the space between the outer covering of the spinal cord and the vertebra. The epidural steroid injection helps in reducing the pain that is caused by the irritation or swelling of the nerve root. However, it does not cure the underlying problem. The injection may be given for the following conditions:  Changes in the soft, gel-like cushion between two vertebrae (disk) due to wear and tear.  A reduction in the space within the spinal canal.  Slipped or herniated disk.  Low back (lumbar) sprain.  Sciatica. This is shooting pain that radiates down the buttocks and the back of the leg due to compression of the nerve.  Traumatic compression fracture of the vertebra.  Pain that develops after a surgery of the spine.  Pain that arises after an attack of viral infection affecting the nerves (shingles). LET YOUR CAREGIVER KNOW ABOUT:   Allergies to food or medicine.  Medicines taken, including vitamins, herbs, eyedrops, over-the-counter medicines, and creams.  Use of steroids (by mouth or creams).  Previous problems with anesthetics or numbing medicines.  History of bleeding problems or blood clots.  Previous surgery.  Other health problems, including diabetes and kidney problems.  Possibility of pregnancy, if this applies.     RISKS AND COMPLICATIONS The complications due to the needle insertion are:  Headache.  Bleeding.  Infection.  Allergic reaction to the medicines.  Damage to the nerves. The complications due to the steroid are:  Weight gain.  Hot flashes.  Mood swings.  Lack of sleep.  Increase in blood sugar levels, especially if you are diabetic.  Retention of water. The response to this procedure  depends on the underlying cause of the pain and its duration. Patients who have long-term (chronic) pain are less likely to benefit from epidural steroids than are patients whose pain comes on strong and suddenly. BEFORE THE PROCEDURE   The caregiver may ask about your symptoms, do a detailed exam, and advise some tests. These tests may include imaging studies. Your caregiver may review the results of your tests and discuss the procedure with you.  Ask your caregiver about changing or stopping your regular medicines. You may be advised to stop taking blood-thinning medicines a few days before the procedure.  You may also be given medicines to reduce your anxiety. PROCEDURE  You will remain awake during the whole procedure. Although, you may receive medicine to make you sleepy. You will be asked to lie on your stomach. The site of the injection is cleansed. Then, the injection site is numbed using a small amount of medicine that numbs the area (local anesthetic). A hollow needle is directed through your skin into the epidural space with the help of an X-ray. The X-ray helps to ensure that the steroid is delivered closest to the affected nerve. You may have some minimal discomfort at this time. Once the needle is in the right position, the local anesthetic and the steroid are injected into the epidural space. The needle is then removed. The skin is cleaned and a bandage is applied. The entire procedure takes only a few minutes, although repeated injections may be required (up to 3 to 4 injections over several weeks).  AFTER THE PROCEDURE   You may be monitored for a short time before you  go home.  You may feel weakness or numbness in your arm or leg, which disappears within 1 to 2 hours.  Someone must drive you home or accompany you home if you are taking a taxi.  You may be allowed to eat, drink, and take your regular medicine.  Your pain may improve or worsen right after the procedure.  You may  feel the beneficial effect of the steroid a few days later.  You may have soreness at the site of the injection.  If you have only partial relief of the pain, the injection may be repeated once or even twice within 4 to 8 weeks of the initial injection. Document Released: 10/26/2007 Document Revised: 10/11/2011 Document Reviewed: 11/28/2008 [facility] Patient Information 2014 [place], [place].

## 2013-03-01 NOTE — Progress Notes (Signed)
Patient ID: Colleen Lee, female   DOB: 07-04-1956, 57 y.o.   MRN: [id number] Chief Complaint  Patient presents with  . Follow-up    1 month recheck on back pain.    Encounter Diagnoses  Name Primary?  . Back pain   . Radicular pain of left lower extremity Yes    The patient still having radicular pain in the left leg from the hip down to the knee she started had an MRI we reintroduced the idea of epidural steroid she would finally like to try that. She will continue her Robaxin and Norco for pain and start epidural series and followup with me in about 2 months  System review denies bowel bladder dysfunction perenial sensory loss

## 2013-03-05 ENCOUNTER — Encounter (HOSPITAL_COMMUNITY)
Admission: RE | Admit: 2013-03-05 | Discharge: 2013-03-05 | Disposition: A | Discharge status: Home / Self Care | Payer: Medicare Other | Source: Ambulatory Visit | Attending: Internal Medicine | Admitting: Internal Medicine

## 2013-03-05 ENCOUNTER — Encounter (HOSPITAL_COMMUNITY): Payer: Self-pay

## 2013-03-05 HISTORY — DX: Unspecified asthma, uncomplicated: J45.909

## 2013-03-05 NOTE — Patient Instructions (Addendum)
Colleen Lee  03/05/2013   Your procedure is scheduled on:   03/08/2013  Report to [facility] at  615  AM.  Call this number if you have problems the morning of surgery: [phone]   Remember:   Do not eat food or drink liquids after midnight.   Take these medicines the morning of surgery with A SIP OF WATER: none   Do not wear jewelry, make-up or nail polish.  Do not wear lotions, powders, or perfumes.   Do not shave 48 hours prior to surgery. Men may shave face and neck.  Do not bring valuables to the hospital.  [facility] is not responsible  for any belongings or valuables.  Contacts, dentures or bridgework may not be worn into surgery.  Leave suitcase in the car. After surgery it may be brought to your room.  For patients admitted to the hospital, checkout time is 11:00 AM the day of discharge.   Patients discharged the day of surgery will not be allowed to drive home.  Name and phone number of your driver: family  Special Instructions: N/A   Please read over the following fact sheets that you were given: Pain Booklet, Coughing and Deep Breathing, Surgical Site Infection Prevention, Anesthesia Post-op Instructions and Care and Recovery After Surgery Colonoscopy A colonoscopy is an exam to evaluate your entire colon. In this exam, your colon is cleansed. A long fiberoptic tube is inserted through your rectum and into your colon. The fiberoptic scope (endoscope) is a long bundle of enclosed and very flexible fibers. These fibers transmit light to the area examined and send images from that area to your caregiver. Discomfort is usually minimal. You may be given a drug to help you sleep (sedative) during or prior to the procedure. This exam helps to detect lumps (tumors), polyps, inflammation, and areas of bleeding. Your caregiver may also take a small piece of tissue (biopsy) that will be examined under a microscope. LET YOUR CAREGIVER KNOW ABOUT:   Allergies to food or  medicine.  Medicines taken, including vitamins, herbs, eyedrops, over-the-counter medicines, and creams.  Use of steroids (by mouth or creams).  Previous problems with anesthetics or numbing medicines.  History of bleeding problems or blood clots.  Previous surgery.  Other health problems, including diabetes and kidney problems.  Possibility of pregnancy, if this applies. BEFORE THE PROCEDURE   A clear liquid diet may be required for 2 days before the exam.  Ask your caregiver about changing or stopping your regular medications.  Liquid injections (enemas) or laxatives may be required.  A large amount of electrolyte solution may be given to you to drink over a short period of time. This solution is used to clean out your colon.  You should be present 60 minutes prior to your procedure or as directed by your caregiver. AFTER THE PROCEDURE   If you received a sedative or pain relieving medication, you will need to arrange for someone to drive you home.  Occasionally, there is a little blood passed with the first bowel movement. Do not be concerned. FINDING OUT THE RESULTS OF YOUR TEST Not all test results are available during your visit. If your test results are not back during the visit, make an appointment with your caregiver to find out the results. Do not assume everything is normal if you have not heard from your caregiver or the medical facility. It is important for you to follow up on all of  your test results. HOME CARE INSTRUCTIONS   It is not unusual to pass moderate amounts of gas and experience mild abdominal cramping following the procedure. This is due to air being used to inflate your colon during the exam. Walking or a warm pack on your belly (abdomen) may help.  You may resume all normal meals and activities after sedatives and medicines have worn off.  Only take over-the-counter or prescription medicines for pain, discomfort, or fever as directed by your  caregiver. Do not use aspirin or blood thinners if a biopsy was taken. Consult your caregiver for medicine usage if biopsies were taken. SEEK IMMEDIATE MEDICAL CARE IF:   You have a fever.  You pass large blood clots or fill a toilet with blood following the procedure. This may also occur 10 to 14 days following the procedure. This is more likely if a biopsy was taken.  You develop abdominal pain that keeps getting worse and cannot be relieved with medicine. Document Released: 07/16/2000 Document Revised: 10/11/2011 Document Reviewed: 02/29/2008 [facility] Patient Information 2014 [place], [place]. PATIENT INSTRUCTIONS POST-ANESTHESIA  IMMEDIATELY FOLLOWING SURGERY:  Do not drive or operate machinery for the first twenty four hours after surgery.  Do not make any important decisions for twenty four hours after surgery or while taking narcotic pain medications or sedatives.  If you develop intractable nausea and vomiting or a severe headache please notify your doctor immediately.  FOLLOW-UP:  Please make an appointment with your surgeon as instructed. You do not need to follow up with anesthesia unless specifically instructed to do so.  WOUND CARE INSTRUCTIONS (if applicable):  Keep a dry clean dressing on the anesthesia/puncture wound site if there is drainage.  Once the wound has quit draining you may leave it open to air.  Generally you should leave the bandage intact for twenty four hours unless there is drainage.  If the epidural site drains for more than 36-48 hours please call the anesthesia department.  QUESTIONS?:  Please feel free to call your physician or the hospital operator if you have any questions, and they will be happy to assist you.

## 2013-03-08 ENCOUNTER — Telehealth: Payer: Self-pay

## 2013-03-08 ENCOUNTER — Encounter (HOSPITAL_COMMUNITY): Payer: Self-pay | Admitting: *Deleted

## 2013-03-08 ENCOUNTER — Encounter (HOSPITAL_COMMUNITY)
Admission: RE | Disposition: A | Discharge status: Home / Self Care | Payer: Self-pay | Source: Ambulatory Visit | Attending: Internal Medicine

## 2013-03-08 ENCOUNTER — Ambulatory Visit (HOSPITAL_COMMUNITY)
Admission: RE | Admit: 2013-03-08 | Discharge: 2013-03-08 | Disposition: A | Discharge status: Home / Self Care | Payer: Medicare Other | Source: Ambulatory Visit | Attending: Internal Medicine | Admitting: Internal Medicine

## 2013-03-08 ENCOUNTER — Encounter (HOSPITAL_COMMUNITY): Payer: Self-pay | Admitting: Anesthesiology

## 2013-03-08 ENCOUNTER — Ambulatory Visit (HOSPITAL_COMMUNITY): Payer: Medicare Other | Admitting: Anesthesiology

## 2013-03-08 DIAGNOSIS — K648 Other hemorrhoids: Secondary | ICD-10-CM

## 2013-03-08 DIAGNOSIS — Z01810 Encounter for preprocedural cardiovascular examination: Secondary | ICD-10-CM | POA: Insufficient documentation

## 2013-03-08 DIAGNOSIS — Z1211 Encounter for screening for malignant neoplasm of colon: Secondary | ICD-10-CM

## 2013-03-08 DIAGNOSIS — I10 Essential (primary) hypertension: Secondary | ICD-10-CM | POA: Insufficient documentation

## 2013-03-08 DIAGNOSIS — D126 Benign neoplasm of colon, unspecified: Secondary | ICD-10-CM | POA: Insufficient documentation

## 2013-03-08 HISTORY — PX: POLYPECTOMY: SHX5525

## 2013-03-08 HISTORY — PX: COLONOSCOPY WITH PROPOFOL: SHX5780

## 2013-03-08 SURGERY — COLONOSCOPY WITH PROPOFOL
Anesthesia: Monitor Anesthesia Care

## 2013-03-08 MED ORDER — MIDAZOLAM HCL 2 MG/2ML IJ SOLN
INTRAMUSCULAR | Status: AC
  Filled 2013-03-08: qty 2

## 2013-03-08 MED ORDER — GLYCOPYRROLATE 0.2 MG/ML IJ SOLN
INTRAMUSCULAR | Status: AC
  Filled 2013-03-08: qty 1

## 2013-03-08 MED ORDER — LACTATED RINGERS IV SOLN: INTRAVENOUS | Status: DC

## 2013-03-08 MED ORDER — PROPOFOL 10 MG/ML IV EMUL
INTRAVENOUS | Status: AC
  Filled 2013-03-08: qty 20

## 2013-03-08 MED ORDER — MIDAZOLAM HCL 2 MG/2ML IJ SOLN
1.0000 mg | INTRAMUSCULAR | Status: DC | PRN
  Administered 2013-03-08 (×2): 2 mg via INTRAVENOUS

## 2013-03-08 MED ORDER — ONDANSETRON HCL 4 MG/2ML IJ SOLN
4.0000 mg | Freq: Once | INTRAMUSCULAR | Status: AC
  Administered 2013-03-08: 4 mg via INTRAVENOUS

## 2013-03-08 MED ORDER — PROPOFOL INFUSION 10 MG/ML OPTIME
INTRAVENOUS | Status: DC | PRN
  Administered 2013-03-08: 75 ug/kg/min via INTRAVENOUS
  Administered 2013-03-08: 100 ug/kg/min via INTRAVENOUS

## 2013-03-08 MED ORDER — MIDAZOLAM HCL 5 MG/5ML IJ SOLN
INTRAMUSCULAR | Status: DC | PRN
  Administered 2013-03-08: 2 mg via INTRAVENOUS

## 2013-03-08 MED ORDER — FENTANYL CITRATE 0.05 MG/ML IJ SOLN: 25.0000 ug | INTRAMUSCULAR | Status: DC | PRN

## 2013-03-08 MED ORDER — GLYCOPYRROLATE 0.2 MG/ML IJ SOLN
0.2000 mg | Freq: Once | INTRAMUSCULAR | Status: AC
  Administered 2013-03-08: 0.2 mg via INTRAVENOUS

## 2013-03-08 MED ORDER — WATER FOR IRRIGATION, STERILE IR SOLN
Status: DC | PRN
  Administered 2013-03-08: 1000 mL

## 2013-03-08 MED ORDER — FENTANYL CITRATE 0.05 MG/ML IJ SOLN
INTRAMUSCULAR | Status: AC
  Filled 2013-03-08: qty 2

## 2013-03-08 MED ORDER — LACTATED RINGERS IV SOLN
INTRAVENOUS | Status: DC | PRN
  Administered 2013-03-08: 07:00:00 via INTRAVENOUS

## 2013-03-08 MED ORDER — ONDANSETRON HCL 4 MG/2ML IJ SOLN
INTRAMUSCULAR | Status: AC
  Filled 2013-03-08: qty 2

## 2013-03-08 MED ORDER — ONDANSETRON HCL 4 MG/2ML IJ SOLN: 4.0000 mg | Freq: Once | INTRAMUSCULAR | Status: DC | PRN

## 2013-03-08 MED ORDER — LIDOCAINE HCL (CARDIAC) 10 MG/ML IV SOLN
INTRAVENOUS | Status: DC | PRN
  Administered 2013-03-08: 50 mg via INTRAVENOUS

## 2013-03-08 MED ORDER — FENTANYL CITRATE 0.05 MG/ML IJ SOLN
INTRAMUSCULAR | Status: DC | PRN
  Administered 2013-03-08 (×2): 25 ug via INTRAVENOUS

## 2013-03-08 MED ORDER — LIDOCAINE HCL (PF) 1 % IJ SOLN
INTRAMUSCULAR | Status: AC
  Filled 2013-03-08: qty 5

## 2013-03-08 MED ORDER — SUCCINYLCHOLINE CHLORIDE 20 MG/ML IJ SOLN
INTRAMUSCULAR | Status: AC
  Filled 2013-03-08: qty 1

## 2013-03-08 MED ORDER — FENTANYL CITRATE 0.05 MG/ML IJ SOLN
25.0000 ug | INTRAMUSCULAR | Status: AC
  Administered 2013-03-08 (×2): 25 ug via INTRAVENOUS

## 2013-03-08 MED ORDER — STERILE WATER FOR IRRIGATION IR SOLN
Status: DC | PRN
  Administered 2013-03-08: 08:00:00

## 2013-03-08 MED ORDER — LACTATED RINGERS IV SOLN
INTRAVENOUS | Status: DC
  Administered 2013-03-08: 07:00:00 via INTRAVENOUS

## 2013-03-08 SURGICAL SUPPLY — 10 items
FLOOR PAD 36X40 (MISCELLANEOUS) ×2
LUBRICANT JELLY 4.5OZ STERILE (MISCELLANEOUS) ×2 IMPLANT
MANIFOLD NEPTUNE II (INSTRUMENTS) ×2 IMPLANT
PAD FLOOR 36X40 (MISCELLANEOUS) ×1 IMPLANT
SNARE ROTATE MED OVAL 20MM (MISCELLANEOUS) ×2 IMPLANT
SYR 50ML LL SCALE MARK (SYRINGE) ×2 IMPLANT
TRAP SPECIMEN MUCOUS 40CC (MISCELLANEOUS) ×2 IMPLANT
TUBING ENDO SMARTCAP PENTAX (MISCELLANEOUS) ×2 IMPLANT
TUBING IRRIGATION ENDOGATOR (MISCELLANEOUS) ×2 IMPLANT
WATER STERILE IRR 1000ML POUR (IV SOLUTION) ×4 IMPLANT

## 2013-03-08 NOTE — H&P (View-Only) (Signed)
Primary Care Physician:  Selinda Flavin, MD Primary Gastroenterologist:  Dr. Jena Gauss  Pre-Procedure History & Physical: HPI:  Colleen Lee is a 57 y.o. female here for abdominal pain and constipation. Incomplete colonoscopy earlier this year. Did not tolerate an air contrast barium enema. Scheduled for a virtual colonoscopy but canceled because of financial issues. Scheduled to see the GI doctors at [facility] for colonoscopy this month. She canceled that appointment. Did not get her hepatitis B vaccine. She did take meds for H. pylori infection. States she's rescheduled appt at [facility] for colonoscopy next month but wants me to try again here. Drug screen positive for marijuana recently the ED. Chronic abdominal pain in the setting of constipation and chronic narcotic therapy.  Weight loss this year has been a concern but it's good to note that she gained about 9 pounds since her last office visit. Patient states she is eating better.  Past Medical History  Diagnosis Date  . Hypertension   . Fibroids   . [name]     HCV RNA positive 09/2012  . GERD (gastroesophageal reflux disease)   . H. pylori infection 2014    treated with pylera    Past Surgical History  Procedure Laterality Date  . Knee surgery      left knee  . Colonoscopy  01/18/2008    ZOX:[id number] rectum.  Long redundant colon, a diminutive sigmoid polyp status post cold biopsy removed. Hyperplastic polyp. Repeat colonoscopy June 2014 due to family history of colon cancer  . Esophagogastroduodenoscopy  01/18/2008    RMR: Normal esophagus, normal  stomach  . Colonoscopy with esophagogastroduodenoscopy (egd) N/A 11/02/2012    [id number]:[id number] gastric mucosa of doubtful, +H.pylori. Incomplete colonoscopy due to patient unable to tolerate exam, proximal colon seen. Patient refused ACBE.    Prior to Admission medications   Medication Sig Start Date End Date Taking? Authorizing Provider  Cyanocobalamin (VITAMIN B 12 PO) Take 1 tablet by  mouth daily.   Yes Historical Provider, MD  gabapentin (NEURONTIN) 100 MG capsule Take 1 capsule (100 mg total) by mouth 3 (three) times daily. 07/04/12  Yes Vickki Hearing, MD  HYDROcodone-acetaminophen (NORCO/VICODIN) 5-325 MG per tablet Take 1 tablet by mouth every 6 (six) hours as needed for pain. 02/14/13  Yes [name], MD  lisinopril (PRINIVIL,ZESTRIL) 10 MG tablet Take 10 mg by mouth daily.   Yes Historical Provider, MD  megestrol (MEGACE) 40 MG/ML suspension Take 200 mg by mouth daily.   Yes Historical Provider, MD  methocarbamol (ROBAXIN) 500 MG tablet Take 1 tablet (500 mg total) by mouth 3 (three) times daily. 01/30/13  Yes Vickki Hearing, MD  omeprazole (PRILOSEC) 20 MG capsule Take 1 capsule (20 mg total) by mouth daily. 10/25/12  Yes Tiffany Kocher, PA-C  promethazine (PHENERGAN) 25 MG suppository Place 1 suppository (25 mg total) rectally every 6 (six) hours as needed for nausea. 02/14/13  Yes [name], MD  promethazine (PHENERGAN) 25 MG tablet Take 1 tablet (25 mg total) by mouth every 6 (six) hours as needed for nausea. 11/26/12  Yes Shelda Jakes, MD    Allergies as of 02/27/2013 - Review Complete 02/27/2013  Allergen Reaction Noted  . Penicillins Rash     Family History  Problem Relation Age of Onset  . Colon cancer Brother        . Asthma      FH  . Multiple myeloma Brother   . Liver cancer Sister   . Prostate  cancer Brother   . Pancreatic cancer Brother   . Cancer Other     niece, age 59, primary unknown    History   Social History  . Marital Status: Married    Spouse Name: N/A    Number of Children: 0  . Years of Education: N/A   Occupational History  . umemployed     Social History Main Topics  . Smoking status: Current Every Day Smoker -- 0.50 packs/day    Types: Cigarettes  . Smokeless tobacco: Not on file  . Alcohol Use: Yes     Comment: former, no etoh since jan 2014  . Drug Use: No     Comment: remote in 1970s  .  Sexually Active: No   Other Topics Concern  . Not on file   Social History Narrative  . No narrative on file    Review of Systems: See HPI, otherwise negative ROS  Physical Exam: BP 145/85  Pulse 65  Temp(Src) 97.2 F (36.2 C) (Oral)  Ht 5\' 6"  (1.676 m)  Wt 129 lb 12.8 oz (58.877 kg)  BMI 20.96 kg/m2 General:   Alert,  Well-developed, well-nourished, pleasant and cooperative in NAD Skin:  Intact without significant lesions or rashes. Eyes:  Sclera clear, no icterus.   Conjunctiva pink. Ears:  Normal auditory acuity. Nose:  No deformity, discharge,  or lesions. Mouth:  No deformity or lesions. Neck:  Supple; no masses or thyromegaly. No significant cervical adenopathy. Lungs:  Clear throughout to auscultation.   No wheezes, crackles, or rhonchi. No acute distress. Heart:  Regular rate and rhythm; no murmurs, clicks, rubs,  or gallops. Abdomen: Non-distended, normal bowel sounds.  Soft and nontender without appreciable mass or hepatosplenomegaly.  Pulses:  Normal pulses noted. Extremities:  Without clubbing or edema.  Impression/Plan:  57 year old lady with chronic [name], chronic abdominal pain in the setting of constipation, positive family history of colon cancer in a first degree relative. She needs a colonoscopy. Chronic abdominal pain, in part, likely related to constipation.  Recent treatment for Helicobacter pylori Wants me to try to perform her colonoscopy once again. She will need propofol.  Recommendations: Will make my best attempt at completing a colonoscopy under propofol in the  near future. Will begin Linzess 145 mcg daily for constipation. Samples provided.  Noncompliance issue. - Reviewed with patient.  Patient urged to follow-up  in the [name] clinic.  The risks, benefits, limitations, alternatives and imponderables of colonoscopy have been reviewed with the patient. Questions have been answered. All parties are agreeable.

## 2013-03-08 NOTE — Op Note (Signed)
[facility] [address] [place], [address]   COLONOSCOPY PROCEDURE REPORT  PATIENT: Colleen, Lee  MR#:         [id number] BIRTHDATE: 1956-02-09 , 56  yrs. old GENDER: Female ENDOSCOPIST: R.  Roetta Sessions, MD FACP FACG REFERRED BY:  Selinda Flavin, M.D. PROCEDURE DATE:  03/08/2013 PROCEDURE:     Colonoscopy with snare polypectomy  INDICATIONS: high risk colorectal cancer screening examination  INFORMED CONSENT:  The risks, benefits, alternatives and imponderables including but not limited to bleeding, perforation as well as the possibility of a missed lesion have been reviewed.  The potential for biopsy, lesion removal, etc. have also been discussed.  Questions have been answered.  All parties agreeable. Please see the history and physical in the medical record for more information.  MEDICATIONS: deep sedation per Dr. [facility]  DESCRIPTION OF PROCEDURE:  After a digital rectal exam was performed, the     colonoscope was advanced from the anus through the rectum and colon to the area of the cecum, ileocecal valve and appendiceal orifice.  The cecum was deeply intubated.  These structures were well-seen and photographed for the record.  From the level of the cecum and ileocecal valve, the scope was slowly and cautiously withdrawn.  The mucosal surfaces were carefully surveyed utilizing scope tip deflection to facilitate fold flattening as needed.  The scope was pulled down into the rectum where a thorough examination  was performed.    FINDINGS:  Adequate preparation. Internal hemorrhoids; otherwise normal rectum. Rectal vault small. Unable to retroflex. Rectal mucosa seen well on-face.   tortuous colon. Normal colonic mucosa except for one flat 3 mm polyp in the mid descending segment.  THERAPEUTIC / DIAGNOSTIC MANEUVERS PERFORMED:  The above-mentioned polyp was cold snare removed and recovered for the  pathologist.  COMPLICATIONS: none  CECAL WITHDRAWAL TIME:  13 minutes  IMPRESSION:  colonic polyp-removed as scribed above. Internal hemorrhoids.  RECOMMENDATIONS: Followup on pathology. Continued Linzess daily for constipation   _______________________________ eSigned:  R. Roetta Sessions, MD FACP [facility] 03/08/2013 8:26 AM   CC:

## 2013-03-08 NOTE — Transfer of Care (Signed)
Immediate Anesthesia Transfer of Care Note  Patient: Colleen Lee  Procedure(s) Performed: Procedure(s): COLONOSCOPY WITH PROPOFOL, at cecum at 0801; total withdrawal time=13 minutes (N/A) POLYPECTOMY (N/A)  Patient Location: PACU  Anesthesia Type:MAC  Level of Consciousness: awake, alert , oriented and patient cooperative  Airway & Oxygen Therapy: Patient Spontanous Breathing and Patient connected to face mask oxygen  Post-op Assessment: Report given to PACU RN and Post -op Vital signs reviewed and stable  Post vital signs: Reviewed and stable  Complications: No apparent anesthesia complications

## 2013-03-08 NOTE — Telephone Encounter (Signed)
Per Florentina Addison at [facility] she is a patient [facility]

## 2013-03-08 NOTE — Interval H&P Note (Signed)
History and Physical Interval Note:  03/08/2013 7:30 AM  Colleen Lee  has presented today for surgery, with the diagnosis of High Risk Screening Colonoscopy; Chronic Abdominal Pain  The various methods of treatment have been discussed with the patient and family. After consideration of risks, benefits and other options for treatment, the patient has consented to  Procedure(s): COLONOSCOPY WITH PROPOFOL (N/A) as a surgical intervention .  The patient's history has been reviewed, patient examined, no change in status, stable for surgery.  I have reviewed the patient's chart and labs.  Questions were answered to the patient's satisfaction.         Molly Maduro Erick Murin    Constipation doing very well with Linzess. Otherwise, no change. High risk screening colonoscopy per plan.  The risks, benefits, limitations, alternatives and imponderables have been reviewed with the patient. Questions have been answered. All parties are agreeable.

## 2013-03-08 NOTE — Telephone Encounter (Signed)
Katie's call back number is [phone].

## 2013-03-08 NOTE — Telephone Encounter (Signed)
T/c from Katie at [facility], said they received some info on this pt and she is not in their system. She tried looking up under birthdate and name.   Can anyone help find out if pt was referred by someone else?

## 2013-03-08 NOTE — Anesthesia Procedure Notes (Signed)
Procedure Name: MAC Date/Time: 03/08/2013 7:35 AM Performed by: Carolyne Littles, AMY L Pre-anesthesia Checklist: Patient identified, Timeout performed, Emergency Drugs available, Patient being monitored and Suction available Oxygen Delivery Method: Non-rebreather mask

## 2013-03-08 NOTE — Anesthesia Preprocedure Evaluation (Addendum)
Anesthesia Evaluation  Patient identified by MRN, date of birth, ID band Patient awake    Reviewed: Allergy & Precautions, H&P , NPO status , [name] Chart, lab work & pertinent test results  Airway Mallampati: II TM Distance: >3 FB     Dental  (+) Poor Dentition and Missing   Pulmonary asthma , COPDCurrent Smoker,  breath sounds clear to auscultation        Cardiovascular hypertension, Pt. on medications Rhythm:Regular Rate:Normal     Neuro/Psych  Neuromuscular disease    GI/Hepatic GERD-  Medicated and Controlled,(+) Hepatitis -, C  Endo/Other    Renal/GU      Musculoskeletal   Abdominal   Peds  Hematology   Anesthesia Other Findings Infection L eyelid  Reproductive/Obstetrics                           Anesthesia Physical Anesthesia Plan  ASA: III  Anesthesia Plan: MAC   Post-op Pain Management:    Induction: Intravenous  Airway Management Planned: Simple Face Mask  Additional Equipment:   Intra-op Plan:   Post-operative Plan:   Informed Consent: I have reviewed the patients History and Physical, chart, labs and discussed the procedure including the risks, benefits and alternatives for the proposed anesthesia with the patient or authorized representative who has indicated his/her understanding and acceptance.     Plan Discussed with:   Anesthesia Plan Comments:         Anesthesia Quick Evaluation

## 2013-03-08 NOTE — Anesthesia Postprocedure Evaluation (Signed)
  Anesthesia Post-op Note  Patient: Colleen Lee  Procedure(s) Performed: Procedure(s): COLONOSCOPY WITH PROPOFOL, at cecum at 0801; total withdrawal time=13 minutes (N/A) POLYPECTOMY (N/A)  Patient Location: PACU  Anesthesia Type:MAC  Level of Consciousness: awake, alert , oriented and patient cooperative  Airway and Oxygen Therapy: Patient Spontanous Breathing and Patient connected to face mask oxygen  Post-op Pain: none  Post-op Assessment: Post-op Vital signs reviewed, [name] Cardiovascular Status Stable, Respiratory Function Stable, Patent Airway, No signs of Nausea or vomiting and Pain level controlled  Post-op Vital Signs: Reviewed and stable  Complications: No apparent anesthesia complications

## 2013-03-08 NOTE — Preoperative (Signed)
Beta Blockers   Reason not to administer Beta Blockers:Not Applicable 

## 2013-03-09 ENCOUNTER — Encounter: Payer: Self-pay | Admitting: Internal Medicine

## 2013-03-09 ENCOUNTER — Encounter (HOSPITAL_COMMUNITY): Payer: Self-pay | Admitting: Internal Medicine

## 2013-03-12 NOTE — Progress Notes (Signed)
Faxed to PCP

## 2013-04-16 ENCOUNTER — Telehealth: Payer: Self-pay | Admitting: Orthopedic Surgery

## 2013-04-17 ENCOUNTER — Other Ambulatory Visit: Payer: Self-pay | Admitting: *Deleted

## 2013-04-17 DIAGNOSIS — M5410 Radiculopathy, site unspecified: Secondary | ICD-10-CM

## 2013-04-17 DIAGNOSIS — M549 Dorsalgia, unspecified: Secondary | ICD-10-CM

## 2013-04-17 MED ORDER — METHOCARBAMOL 500 MG PO TABS: 500.0000 mg | ORAL_TABLET | Freq: Three times a day (TID) | ORAL | Status: DC

## 2013-04-17 NOTE — Telephone Encounter (Signed)
Sent prescription to pharmacy.  

## 2013-05-01 ENCOUNTER — Ambulatory Visit (INDEPENDENT_AMBULATORY_CARE_PROVIDER_SITE_OTHER): Payer: Medicare Other | Admitting: Orthopedic Surgery

## 2013-05-01 ENCOUNTER — Encounter: Payer: Self-pay | Admitting: Orthopedic Surgery

## 2013-05-01 VITALS — BP 136/83 | Ht 66.0 in | Wt 127.0 lb

## 2013-05-01 DIAGNOSIS — M5410 Radiculopathy, site unspecified: Secondary | ICD-10-CM

## 2013-05-01 DIAGNOSIS — Z9889 Other specified postprocedural states: Secondary | ICD-10-CM

## 2013-05-01 DIAGNOSIS — M5432 Sciatica, left side: Secondary | ICD-10-CM

## 2013-05-01 DIAGNOSIS — M549 Dorsalgia, unspecified: Secondary | ICD-10-CM

## 2013-05-01 DIAGNOSIS — R110 Nausea: Secondary | ICD-10-CM

## 2013-05-01 DIAGNOSIS — S82142P Displaced bicondylar fracture of left tibia, subsequent encounter for closed fracture with malunion: Secondary | ICD-10-CM

## 2013-05-01 DIAGNOSIS — IMO0002 Reserved for concepts with insufficient information to code with codable children: Secondary | ICD-10-CM

## 2013-05-01 DIAGNOSIS — M5430 Sciatica, unspecified side: Secondary | ICD-10-CM

## 2013-05-01 MED ORDER — ONDANSETRON 8 MG PO TBDP: 8.0000 mg | ORAL_TABLET | Freq: Three times a day (TID) | ORAL | Status: DC | PRN

## 2013-05-01 MED ORDER — HYDROCODONE-ACETAMINOPHEN 5-325 MG PO TABS: 1.0000 | ORAL_TABLET | Freq: Four times a day (QID) | ORAL | Status: DC | PRN

## 2013-05-01 NOTE — Progress Notes (Signed)
Patient ID: Colleen Lee, female   DOB: [birth year], 57 y.o.   MRN: [id number]  Routine followup status post scheduled for epidural steroid injections 2 months ago did not get the epidural steroid injections complains of continued left leg pain. The patient also like her medications [place] including Zofran and hydrocodone 5 mg she is also taking Robaxin.  We will reschedule the medications and the epidural injections in followup in 6 weeks.  Note: Did not reschedule unless an epidural steroid is given.

## 2013-05-01 NOTE — Patient Instructions (Addendum)
Reorder epidurals

## 2013-05-08 ENCOUNTER — Ambulatory Visit
Admission: RE | Admit: 2013-05-08 | Discharge: 2013-05-08 | Disposition: A | Discharge status: Home / Self Care | Payer: Medicaid Other | Source: Ambulatory Visit | Attending: Orthopedic Surgery | Admitting: Orthopedic Surgery

## 2013-05-08 DIAGNOSIS — M5410 Radiculopathy, site unspecified: Secondary | ICD-10-CM

## 2013-05-08 DIAGNOSIS — M549 Dorsalgia, unspecified: Secondary | ICD-10-CM

## 2013-05-08 MED ORDER — IOHEXOL 180 MG/ML  SOLN
1.0000 mL | Freq: Once | INTRAMUSCULAR | Status: AC | PRN
  Administered 2013-05-08: 1 mL via EPIDURAL

## 2013-05-08 MED ORDER — METHYLPREDNISOLONE ACETATE 40 MG/ML INJ SUSP (RADIOLOG
120.0000 mg | Freq: Once | INTRAMUSCULAR | Status: AC
  Administered 2013-05-08: 120 mg via EPIDURAL

## 2013-05-28 ENCOUNTER — Other Ambulatory Visit: Payer: Self-pay | Admitting: Orthopedic Surgery

## 2013-05-28 DIAGNOSIS — M79605 Pain in left leg: Secondary | ICD-10-CM

## 2013-05-31 ENCOUNTER — Ambulatory Visit
Admission: RE | Admit: 2013-05-31 | Discharge: 2013-05-31 | Disposition: A | Discharge status: Home / Self Care | Payer: Medicaid Other | Source: Ambulatory Visit | Attending: Orthopedic Surgery | Admitting: Orthopedic Surgery

## 2013-05-31 DIAGNOSIS — M79605 Pain in left leg: Secondary | ICD-10-CM

## 2013-05-31 MED ORDER — IOHEXOL 180 MG/ML  SOLN
1.0000 mL | Freq: Once | INTRAMUSCULAR | Status: AC | PRN
  Administered 2013-05-31: 1 mL via INTRAVENOUS

## 2013-05-31 MED ORDER — METHYLPREDNISOLONE ACETATE 40 MG/ML INJ SUSP (RADIOLOG
120.0000 mg | Freq: Once | INTRAMUSCULAR | Status: AC
  Administered 2013-05-31: 120 mg via EPIDURAL

## 2013-06-18 ENCOUNTER — Other Ambulatory Visit: Payer: Self-pay | Admitting: Orthopedic Surgery

## 2013-06-18 DIAGNOSIS — M5410 Radiculopathy, site unspecified: Secondary | ICD-10-CM

## 2013-06-18 DIAGNOSIS — M549 Dorsalgia, unspecified: Secondary | ICD-10-CM

## 2013-06-20 ENCOUNTER — Ambulatory Visit
Admission: RE | Admit: 2013-06-20 | Discharge: 2013-06-20 | Disposition: A | Discharge status: Home / Self Care | Payer: Medicaid Other | Source: Ambulatory Visit | Attending: Orthopedic Surgery | Admitting: Orthopedic Surgery

## 2013-06-20 VITALS — BP 140/89 | HR 74

## 2013-06-20 DIAGNOSIS — M5432 Sciatica, left side: Secondary | ICD-10-CM

## 2013-06-20 DIAGNOSIS — M5410 Radiculopathy, site unspecified: Secondary | ICD-10-CM

## 2013-06-20 DIAGNOSIS — M549 Dorsalgia, unspecified: Secondary | ICD-10-CM

## 2013-06-20 MED ORDER — METHYLPREDNISOLONE ACETATE 40 MG/ML INJ SUSP (RADIOLOG
120.0000 mg | Freq: Once | INTRAMUSCULAR | Status: AC
  Administered 2013-06-20: 120 mg via EPIDURAL

## 2013-06-20 MED ORDER — IOHEXOL 180 MG/ML  SOLN
1.0000 mL | Freq: Once | INTRAMUSCULAR | Status: AC | PRN
  Administered 2013-06-20: 1 mL via EPIDURAL

## 2013-06-21 ENCOUNTER — Ambulatory Visit (INDEPENDENT_AMBULATORY_CARE_PROVIDER_SITE_OTHER): Payer: Medicare Other | Admitting: Orthopedic Surgery

## 2013-06-21 VITALS — BP 146/88 | Ht 66.0 in | Wt 131.0 lb

## 2013-06-21 DIAGNOSIS — IMO0002 Reserved for concepts with insufficient information to code with codable children: Secondary | ICD-10-CM

## 2013-06-21 DIAGNOSIS — M549 Dorsalgia, unspecified: Secondary | ICD-10-CM

## 2013-06-21 DIAGNOSIS — M5410 Radiculopathy, site unspecified: Secondary | ICD-10-CM

## 2013-06-21 MED ORDER — HYDROCODONE-ACETAMINOPHEN 5-325 MG PO TABS: 1.0000 | ORAL_TABLET | Freq: Four times a day (QID) | ORAL | Status: DC | PRN

## 2013-06-21 NOTE — Progress Notes (Signed)
Patient ID: Colleen Lee, female   DOB: 11/17/1955, 57 y.o.   MRN: [id number]  Chief Complaint  Patient presents with  . Follow-up    6 week recheck s/p ESI    Status post third epidural steroid injection with good relief for approximately 2 weeks at a time.  Current medications Norco and gabapentin  Review of systems no bowel or bladder dysfunction  She reports improvement with epidural steroids but last less than 2 weeks at a time  Followup in 4 weeks reassess her situation she is advised that she may need surgical intervention if her pain continues  Meds ordered this encounter  Medications  . HYDROcodone-acetaminophen (NORCO/VICODIN) 5-325 MG per tablet    Sig: Take 1 tablet by mouth every 6 (six) hours as needed.    Dispense:  120 tablet    Refill:  0     Continue Neurontin

## 2013-06-22 ENCOUNTER — Encounter (HOSPITAL_COMMUNITY): Payer: Self-pay | Admitting: Emergency Medicine

## 2013-06-22 ENCOUNTER — Emergency Department (HOSPITAL_COMMUNITY)
Admission: EM | Admit: 2013-06-22 | Discharge: 2013-06-23 | Disposition: A | Discharge status: Home / Self Care | Payer: Medicare Other | Source: Home / Self Care | Attending: Emergency Medicine | Admitting: Emergency Medicine

## 2013-06-22 ENCOUNTER — Emergency Department (HOSPITAL_COMMUNITY)
Admission: EM | Admit: 2013-06-22 | Discharge: 2013-06-22 | Disposition: A | Discharge status: Home / Self Care | Payer: Medicare Other | Attending: Emergency Medicine | Admitting: Emergency Medicine

## 2013-06-22 ENCOUNTER — Emergency Department (HOSPITAL_COMMUNITY): Payer: Medicare Other

## 2013-06-22 DIAGNOSIS — Z880 Allergy status to penicillin: Secondary | ICD-10-CM | POA: Insufficient documentation

## 2013-06-22 DIAGNOSIS — J45909 Unspecified asthma, uncomplicated: Secondary | ICD-10-CM | POA: Insufficient documentation

## 2013-06-22 DIAGNOSIS — Z8619 Personal history of other infectious and parasitic diseases: Secondary | ICD-10-CM | POA: Insufficient documentation

## 2013-06-22 DIAGNOSIS — R112 Nausea with vomiting, unspecified: Secondary | ICD-10-CM | POA: Insufficient documentation

## 2013-06-22 DIAGNOSIS — K219 Gastro-esophageal reflux disease without esophagitis: Secondary | ICD-10-CM | POA: Insufficient documentation

## 2013-06-22 DIAGNOSIS — Z79899 Other long term (current) drug therapy: Secondary | ICD-10-CM | POA: Insufficient documentation

## 2013-06-22 DIAGNOSIS — R1084 Generalized abdominal pain: Secondary | ICD-10-CM

## 2013-06-22 DIAGNOSIS — Z8742 Personal history of other diseases of the female genital tract: Secondary | ICD-10-CM | POA: Insufficient documentation

## 2013-06-22 DIAGNOSIS — R109 Unspecified abdominal pain: Secondary | ICD-10-CM | POA: Insufficient documentation

## 2013-06-22 DIAGNOSIS — Z9889 Other specified postprocedural states: Secondary | ICD-10-CM | POA: Insufficient documentation

## 2013-06-22 DIAGNOSIS — F17200 Nicotine dependence, unspecified, uncomplicated: Secondary | ICD-10-CM | POA: Insufficient documentation

## 2013-06-22 DIAGNOSIS — E876 Hypokalemia: Secondary | ICD-10-CM

## 2013-06-22 DIAGNOSIS — I10 Essential (primary) hypertension: Secondary | ICD-10-CM | POA: Insufficient documentation

## 2013-06-22 LAB — URINALYSIS, ROUTINE W REFLEX MICROSCOPIC
Bilirubin Urine: NEGATIVE
Glucose, UA: NEGATIVE mg/dL
Hgb urine dipstick: NEGATIVE
Ketones, ur: NEGATIVE mg/dL
Leukocytes, UA: NEGATIVE
Nitrite: NEGATIVE
Protein, ur: NEGATIVE mg/dL
Specific Gravity, Urine: 1.02 (ref 1.005–1.030)
Urobilinogen, UA: 2 mg/dL — ABNORMAL HIGH (ref 0.0–1.0)
pH: 7 (ref 5.0–8.0)

## 2013-06-22 LAB — CBC WITH DIFFERENTIAL/PLATELET
Basophils Absolute: 0 10*3/uL (ref 0.0–0.1)
Basophils Absolute: 0 10*3/uL (ref 0.0–0.1)
Basophils Relative: 0 % (ref 0–1)
Basophils Relative: 0 % (ref 0–1)
Eosinophils Absolute: 0 10*3/uL (ref 0.0–0.7)
Eosinophils Absolute: 0.1 10*3/uL (ref 0.0–0.7)
Eosinophils Relative: 0 % (ref 0–5)
Eosinophils Relative: 1 % (ref 0–5)
HCT: 43.4 % (ref 36.0–46.0)
HCT: 41 % (ref 36.0–46.0)
Hemoglobin: 15.5 g/dL — ABNORMAL HIGH (ref 12.0–15.0)
Hemoglobin: 14.6 g/dL (ref 12.0–15.0)
Lymphocytes Relative: 11 % — ABNORMAL LOW (ref 12–46)
Lymphocytes Relative: 21 % (ref 12–46)
Lymphs Abs: 0.8 10*3/uL (ref 0.7–4.0)
Lymphs Abs: 1.7 10*3/uL (ref 0.7–4.0)
MCH: 32.4 pg (ref 26.0–34.0)
MCH: 32.3 pg (ref 26.0–34.0)
MCHC: 35.7 g/dL (ref 30.0–36.0)
MCHC: 35.6 g/dL (ref 30.0–36.0)
MCV: 90.6 fL (ref 78.0–100.0)
MCV: 90.7 fL (ref 78.0–100.0)
Monocytes Absolute: 0.4 10*3/uL (ref 0.1–1.0)
Monocytes Absolute: 0.8 10*3/uL (ref 0.1–1.0)
Monocytes Relative: 6 % (ref 3–12)
Monocytes Relative: 10 % (ref 3–12)
Neutro Abs: 6 10*3/uL (ref 1.7–7.7)
Neutro Abs: 5.4 10*3/uL (ref 1.7–7.7)
Neutrophils Relative %: 83 % — ABNORMAL HIGH (ref 43–77)
Neutrophils Relative %: 68 % (ref 43–77)
Platelets: 157 10*3/uL (ref 150–400)
Platelets: 158 10*3/uL (ref 150–400)
RBC: 4.79 MIL/uL (ref 3.87–5.11)
RBC: 4.52 MIL/uL (ref 3.87–5.11)
RDW: 13.5 % (ref 11.5–15.5)
RDW: 13.7 % (ref 11.5–15.5)
WBC: 7.2 10*3/uL (ref 4.0–10.5)
WBC: 7.9 10*3/uL (ref 4.0–10.5)

## 2013-06-22 LAB — COMPREHENSIVE METABOLIC PANEL
ALT: 23 U/L (ref 0–35)
ALT: 21 U/L (ref 0–35)
AST: 18 U/L (ref 0–37)
AST: 16 U/L (ref 0–37)
Albumin: 4.2 g/dL (ref 3.5–5.2)
Albumin: 4.2 g/dL (ref 3.5–5.2)
Alkaline Phosphatase: 62 U/L (ref 39–117)
Alkaline Phosphatase: 60 U/L (ref 39–117)
BUN: 11 mg/dL (ref 6–23)
BUN: 12 mg/dL (ref 6–23)
CO2: 21 mEq/L (ref 19–32)
CO2: 22 mEq/L (ref 19–32)
Calcium: 11 mg/dL — ABNORMAL HIGH (ref 8.4–10.5)
Calcium: 10.8 mg/dL — ABNORMAL HIGH (ref 8.4–10.5)
Chloride: 102 mEq/L (ref 96–112)
Chloride: 100 mEq/L (ref 96–112)
Creatinine, Ser: 0.54 mg/dL (ref 0.50–1.10)
Creatinine, Ser: 0.58 mg/dL (ref 0.50–1.10)
GFR calc Af Amer: >90 mL/min (ref >90)
GFR calc Af Amer: >90 mL/min (ref >90)
GFR calc non Af Amer: >90 mL/min (ref >90)
GFR calc non Af Amer: >90 mL/min (ref >90)
Glucose, Bld: 138 mg/dL — ABNORMAL HIGH (ref 70–99)
Glucose, Bld: 102 mg/dL — ABNORMAL HIGH (ref 70–99)
Potassium: 3.3 mEq/L — ABNORMAL LOW (ref 3.5–5.1)
Potassium: 3 mEq/L — ABNORMAL LOW (ref 3.5–5.1)
Sodium: 137 mEq/L (ref 135–145)
Sodium: 135 mEq/L (ref 135–145)
Total Bilirubin: 1.2 mg/dL (ref 0.3–1.2)
Total Bilirubin: 1.5 mg/dL — ABNORMAL HIGH (ref 0.3–1.2)
Total Protein: 9 g/dL — ABNORMAL HIGH (ref 6.0–8.3)
Total Protein: 8.6 g/dL — ABNORMAL HIGH (ref 6.0–8.3)

## 2013-06-22 LAB — RAPID URINE DRUG SCREEN, HOSP PERFORMED
Amphetamines: NOT DETECTED
Barbiturates: NOT DETECTED
Benzodiazepines: NOT DETECTED
Cocaine: NOT DETECTED
Opiates: POSITIVE — AB
Tetrahydrocannabinol: POSITIVE — AB

## 2013-06-22 LAB — LIPASE, BLOOD: Lipase: 29 U/L (ref 11–59)

## 2013-06-22 LAB — LACTIC ACID, PLASMA: Lactic Acid, Venous: 1.5 mmol/L (ref 0.5–2.2)

## 2013-06-22 MED ORDER — LORAZEPAM 2 MG/ML IJ SOLN
1.0000 mg | Freq: Once | INTRAMUSCULAR | Status: AC
  Administered 2013-06-22: 1 mg via INTRAVENOUS
  Filled 2013-06-22: qty 1

## 2013-06-22 MED ORDER — ONDANSETRON HCL 4 MG PO TABS: 4.0000 mg | ORAL_TABLET | Freq: Four times a day (QID) | ORAL | Status: DC | PRN

## 2013-06-22 MED ORDER — DIPHENHYDRAMINE HCL 50 MG/ML IJ SOLN
25.0000 mg | Freq: Once | INTRAMUSCULAR | Status: AC
  Administered 2013-06-22: 25 mg via INTRAVENOUS
  Filled 2013-06-22: qty 1

## 2013-06-22 MED ORDER — METOCLOPRAMIDE HCL 5 MG/ML IJ SOLN
10.0000 mg | Freq: Once | INTRAMUSCULAR | Status: AC
  Administered 2013-06-22: 10 mg via INTRAVENOUS
  Filled 2013-06-22: qty 2

## 2013-06-22 MED ORDER — SODIUM CHLORIDE 0.9 % IV SOLN: 1000.0000 mL | INTRAVENOUS | Status: DC

## 2013-06-22 MED ORDER — SODIUM CHLORIDE 0.9 % IV SOLN: 1000.0000 mL | Freq: Once | INTRAVENOUS | Status: DC

## 2013-06-22 MED ORDER — KETOROLAC TROMETHAMINE 30 MG/ML IJ SOLN
30.0000 mg | Freq: Once | INTRAMUSCULAR | Status: AC
  Administered 2013-06-22: 30 mg via INTRAVENOUS
  Filled 2013-06-22: qty 1

## 2013-06-22 MED ORDER — SODIUM CHLORIDE 0.9 % IV SOLN
1000.0000 mL | Freq: Once | INTRAVENOUS | Status: AC
  Administered 2013-06-22: 1000 mL via INTRAVENOUS

## 2013-06-22 MED ORDER — ONDANSETRON HCL 4 MG/2ML IJ SOLN
4.0000 mg | Freq: Once | INTRAMUSCULAR | Status: AC
  Administered 2013-06-22: 4 mg via INTRAVENOUS
  Filled 2013-06-22: qty 2

## 2013-06-22 MED ORDER — SODIUM CHLORIDE 0.9 % IV BOLUS (SEPSIS)
1000.0000 mL | Freq: Once | INTRAVENOUS | Status: AC
  Administered 2013-06-23: 1000 mL via INTRAVENOUS

## 2013-06-22 MED ORDER — HYDROMORPHONE HCL PF 1 MG/ML IJ SOLN
1.0000 mg | Freq: Once | INTRAMUSCULAR | Status: AC
  Administered 2013-06-22: 1 mg via INTRAVENOUS
  Filled 2013-06-22: qty 1

## 2013-06-22 MED ORDER — OXYCODONE-ACETAMINOPHEN 5-325 MG PO TABS: 1.0000 | ORAL_TABLET | ORAL | Status: DC | PRN

## 2013-06-22 MED ORDER — SODIUM CHLORIDE 0.9 % IV SOLN
1000.0000 mL | INTRAVENOUS | Status: DC
  Administered 2013-06-22: 1000 mL via INTRAVENOUS

## 2013-06-22 NOTE — ED Provider Notes (Signed)
CSN: [id number]     Arrival date & time 06/22/13  0502 History   First MD Initiated Contact with Patient 06/22/13 [phone]     Chief Complaint  Patient presents with  . Emesis    vomiting for 3 days   (Consider location/radiation/quality/duration/timing/severity/associated sxs/prior Treatment) The history is provided by the patient.   57 year old female comes in with two-day history of crampy abdominal pain, nausea, vomiting. She initially had diarrhea but that has subsided. She is not to hold anything down because of persistent vomiting. Pain is severe and she rates it at 8/10. Pain is momentarily better after vomiting but recurs quickly. There is no radiation of pain to the back, chest, shoulder. There's no blood or mucus in the emesis. She has not taken anything for the pain or nausea at home. She has had multiple similar episodes in the past.  Past Medical History  Diagnosis Date  . Hypertension   . Fibroids   . [name]     HCV RNA positive 09/2012  . GERD (gastroesophageal reflux disease)   . H. pylori infection 2014    treated with pylera  . Asthma    Past Surgical History  Procedure Laterality Date  . Knee surgery      left knee  . Colonoscopy  01/18/2008    JXB:JYNWGN rectum.  Long redundant colon, a diminutive sigmoid polyp status post cold biopsy removed. Hyperplastic polyp. Repeat colonoscopy June 2014 due to family history of colon cancer  . Esophagogastroduodenoscopy  01/18/2008    RMR: Normal esophagus, normal  stomach  . Colonoscopy with esophagogastroduodenoscopy (egd) N/A 11/02/2012    FAO:[id number] gastric mucosa of doubtful, +H.pylori. Incomplete colonoscopy due to patient unable to tolerate exam, proximal colon seen. Patient refused ACBE.  . Colonoscopy with propofol N/A 03/08/2013    Procedure: COLONOSCOPY WITH PROPOFOL, at cecum at 0801; total withdrawal time=13 minutes;  Surgeon: Corbin Ade, MD;  Location: [facility];  Service: Endoscopy;  Laterality: N/A;  .  Polypectomy N/A 03/08/2013    Procedure: POLYPECTOMY;  Surgeon: Corbin Ade, MD;  Location: [facility];  Service: Endoscopy;  Laterality: N/A;   Family History  Problem Relation Age of Onset  . Colon cancer Brother        . Asthma      FH  . Multiple myeloma Brother   . Liver cancer Sister   . Prostate cancer Brother   . Pancreatic cancer Brother   . Cancer Other     niece, age 17, primary unknown   History  Substance Use Topics  . Smoking status: Current Every Day Smoker -- 0.50 packs/day for 40 years    Types: Cigarettes  . Smokeless tobacco: Not on file  . Alcohol Use: No     Comment: former, last Jan 2014   OB History   Grav Para Term Preterm Abortions TAB SAB Ect Mult Living                 Review of Systems  All other systems reviewed and are negative.    Allergies  Penicillins  Home Medications   Current Outpatient Rx  Name  Route  Sig  Dispense  Refill  . Cyanocobalamin (VITAMIN B 12 PO)   Oral   Take 1 tablet by mouth daily.         Marland Kitchen gabapentin (NEURONTIN) 100 MG capsule   Oral   Take 1 capsule (100 mg total) by mouth 3 (three) times daily.   90 capsule  5   . HYDROcodone-acetaminophen (NORCO/VICODIN) 5-325 MG per tablet   Oral   Take 1 tablet by mouth every 6 (six) hours as needed.   120 tablet   0   . lisinopril (PRINIVIL,ZESTRIL) 10 MG tablet   Oral   Take 10 mg by mouth daily.         . megestrol (MEGACE) 40 MG/ML suspension   Oral   Take 200 mg by mouth daily.         . methocarbamol (ROBAXIN) 500 MG tablet   Oral   Take 1 tablet (500 mg total) by mouth 3 (three) times daily.   60 tablet   5   . omeprazole (PRILOSEC) 20 MG capsule   Oral   Take 1 capsule (20 mg total) by mouth daily.   30 capsule   11   . ondansetron (ZOFRAN ODT) 8 MG disintegrating tablet   Oral   Take 1 tablet (8 mg total) by mouth every 8 (eight) hours as needed for nausea.   60 tablet   0   . polyethylene glycol-electrolytes (TRILYTE) 420 G  solution   Oral   Take 4,000 mLs by mouth as directed.   4000 mL   0   . promethazine (PHENERGAN) 25 MG suppository   Rectal   Place 1 suppository (25 mg total) rectally every 6 (six) hours as needed for nausea.   12 each   0   . promethazine (PHENERGAN) 25 MG tablet   Oral   Take 1 tablet (25 mg total) by mouth every 6 (six) hours as needed for nausea.   20 tablet   0    BP 156/93  Pulse 82  Temp(Src) 97.9 F (36.6 C) (Oral)  Resp 24  Ht 5\' 6"  (1.676 m)  Wt 133 lb (60.328 kg)  BMI 21.48 kg/m2  SpO2 100% Physical Exam  Nursing note and vitals reviewed.  57 year old female, resting comfortably and in no acute distress. Vital signs are significant for hypertension with blood pressure 156/93, and tachypnea with respiratory rate of 24. Oxygen saturation is 100%, which is normal. Head is normocephalic and atraumatic. PERRLA, EOMI. Oropharynx is clear. There is no scleral icterus. Neck is nontender and supple without adenopathy or JVD. Back is nontender and there is no CVA tenderness. Lungs are clear without rales, wheezes, or rhonchi. Chest is nontender. Heart has regular rate and rhythm without murmur. Abdomen is soft, flat, with mild tenderness to Tuesday. There is no rebound or guarding. There are no masses or hepatosplenomegaly and peristalsis is normoactive. Extremities have no cyanosis or edema, full range of motion is present. Skin is warm and dry without rash. Neurologic: Mental status is normal, cranial nerves are intact, there are no motor or sensory deficits.  ED Course  Procedures (including critical care time) Labs Review Results for orders placed during the hospital encounter of 06/22/13  CBC WITH DIFFERENTIAL      Result Value Range   WBC 7.2  4.0 - 10.5 K/uL   RBC 4.79  3.87 - 5.11 MIL/uL   Hemoglobin 15.5 (*) 12.0 - 15.0 g/dL   HCT 47.8  29.5 - 62.1 %   MCV 90.6  78.0 - 100.0 fL   MCH 32.4  26.0 - 34.0 pg   MCHC 35.7  30.0 - 36.0 g/dL   RDW 30.8   65.7 - 84.6 %   Platelets 157  150 - 400 K/uL   Neutrophils Relative % 83 (*) 43 - 77 %  Neutro Abs 6.0  1.7 - 7.7 K/uL   Lymphocytes Relative 11 (*) 12 - 46 %   Lymphs Abs 0.8  0.7 - 4.0 K/uL   Monocytes Relative 6  3 - 12 %   Monocytes Absolute 0.4  0.1 - 1.0 K/uL   Eosinophils Relative 0  0 - 5 %   Eosinophils Absolute 0.0  0.0 - 0.7 K/uL   Basophils Relative 0  0 - 1 %   Basophils Absolute 0.0  0.0 - 0.1 K/uL  COMPREHENSIVE METABOLIC PANEL      Result Value Range   Sodium 137  135 - 145 mEq/L   Potassium 3.3 (*) 3.5 - 5.1 mEq/L   Chloride 102  96 - 112 mEq/L   CO2 21  19 - 32 mEq/L   Glucose, Bld 138 (*) 70 - 99 mg/dL   BUN 11  6 - 23 mg/dL   Creatinine, Ser 8.11  0.50 - 1.10 mg/dL   Calcium 91.4 (*) 8.4 - 10.5 mg/dL   Total Protein 9.0 (*) 6.0 - 8.3 g/dL   Albumin 4.2  3.5 - 5.2 g/dL   AST 18  0 - 37 U/L   ALT 23  0 - 35 U/L   Alkaline Phosphatase 62  39 - 117 U/L   Total Bilirubin 1.2  0.3 - 1.2 mg/dL   GFR calc non Af Amer >90  >90 mL/min   GFR calc Af Amer >90  >90 mL/min  LIPASE, BLOOD      Result Value Range   Lipase 29  11 - 59 U/L  LACTIC ACID, PLASMA      Result Value Range   Lactic Acid, Venous 1.5  0.5 - 2.2 mmol/L   Imaging Review Dg Abd Acute W/chest  06/22/2013   CLINICAL DATA:  Abdominal pain and vomit  EXAM: ACUTE ABDOMEN SERIES (ABDOMEN 2 VIEW & CHEST 1 VIEW)  COMPARISON:  Chest x-ray and abdominal CT 11/27/2012.  FINDINGS: No infiltrate or edema. No effusion or pneumothorax. Normal heart size. Bilateral nipple shadows.  No pneumoperitoneum or pneumatosis. No abnormal intra-abdominal mass effect. Coarse calcifications in the pelvis correlates with hyalinized fibroids. Nonobstructive bowel gas pattern.  IMPRESSION: 1. Nonobstructive bowel gas pattern. 2. No evidence acute cardiopulmonary disease.   Electronically Signed   By: Tiburcio Pea M.D.   On: 06/22/2013 06:17   MDM   1. Nausea & vomiting   2. Abdominal pain    Abdominal pain and vomiting  which appears to be a recurrence of a chronic problem. Old records are reviewed and she does have numerous ED visits for same area demonstrates an ED visit was July 18. She'll be given IV fluids, IV hydromorphone, and IV ondansetron.  She feels much better after above noted treatment. Laboratory workup is significant only for mild hypercalcemia and x-rays are showing no evidence of ileus or obstruction. She is discharged with prescriptions for ondansetron and oxycodone-acetaminophen.  Dione Booze, MD 06/22/13 [phone]

## 2013-06-22 NOTE — ED Notes (Addendum)
Assunta Gambles Ph: [phone] Call when patient is ready to go home.

## 2013-06-22 NOTE — ED Provider Notes (Signed)
CSN: [id number]     Arrival date & time 06/22/13  1827 History  This chart was scribed for Ward Givens, MD by Caryn Bee, ED Scribe. This patient was seen in room [facility]09/[facility]09 and the patient's care was started 7:08 PM.    Chief Complaint  Patient presents with  . Nausea  . Emesis  . Abdominal Pain   HPI HPI Comments: STAPHANIE Lee is a 57 y.o. female who presents to the Emergency Department complaining of intermittent emesis that began 06/19/13. Pt was seen here last night for the same. She was given fluids, nausea medications, and pain medications. Pt reports not being to keep anything down. She complains of associated abdominal pain that is a diffuse soreness from vomiting. Pt regularly takes prescription hydrocodone for pain. She states she hasn't run out.  Pt denies diarrhea, fever, coughing.Pt is a pack per day smoker. Pt is out on disability because she broke her knee. Pt has h/o gastric cancer.   PCP Dr Dimas Aguas in [place]   Past Medical History  Diagnosis Date  . Hypertension   . Fibroids   . [name]     HCV RNA positive 09/2012  . GERD (gastroesophageal reflux disease)   . H. pylori infection 2014    treated with pylera  . Asthma    Past Surgical History  Procedure Laterality Date  . Knee surgery      left knee  . Colonoscopy  01/18/2008    [id number]:[id number] rectum.  Long redundant colon, a diminutive sigmoid polyp status post cold biopsy removed. Hyperplastic polyp. Repeat colonoscopy June 2014 due to family history of colon cancer  . Esophagogastroduodenoscopy  01/18/2008    RMR: Normal esophagus, normal  stomach  . Colonoscopy with esophagogastroduodenoscopy (egd) N/A 11/02/2012    NWG:[id number] gastric mucosa of doubtful, +H.pylori. Incomplete colonoscopy due to patient unable to tolerate exam, proximal colon seen. Patient refused ACBE.  . Colonoscopy with propofol N/A 03/08/2013    Procedure: COLONOSCOPY WITH PROPOFOL, at cecum at 0801; total withdrawal time=13 minutes;   Surgeon: Corbin Ade, MD;  Location: [facility];  Service: Endoscopy;  Laterality: N/A;  . Polypectomy N/A 03/08/2013    Procedure: POLYPECTOMY;  Surgeon: Corbin Ade, MD;  Location: [facility];  Service: Endoscopy;  Laterality: N/A;   Family History  Problem Relation Age of Onset  . Colon cancer Brother        . Asthma      FH  . Multiple myeloma Brother   . Liver cancer Sister   . Prostate cancer Brother   . Pancreatic cancer Brother   . Cancer Other     niece, age 49, primary unknown   History  Substance Use Topics  . Smoking status: Current Every Day Smoker -- 0.50 packs/day for 40 years    Types: Cigarettes  . Smokeless tobacco: Not on file  . Alcohol Use: No     Comment: former, last Jan 2014   On disability for her knee fracture Smokes 1 ppd  OB History   Grav Para Term Preterm Abortions TAB SAB Ect Mult Living                 Review of Systems  Constitutional: Negative for fever.  Respiratory: Negative for cough.   Gastrointestinal: Positive for vomiting and abdominal pain. Negative for diarrhea.  All other systems reviewed and are negative.    Allergies  Penicillins  Home Medications   Current Outpatient Rx  Name  Route  Sig  Dispense  Refill  . Cyanocobalamin (VITAMIN B 12 PO)   Oral   Take 1 tablet by mouth daily.         Marland Kitchen gabapentin (NEURONTIN) 100 MG capsule   Oral   Take 1 capsule (100 mg total) by mouth 3 (three) times daily.   90 capsule   5   . HYDROcodone-acetaminophen (NORCO/VICODIN) 5-325 MG per tablet   Oral   Take 1 tablet by mouth every 6 (six) hours as needed.   120 tablet   0   . lisinopril (PRINIVIL,ZESTRIL) 10 MG tablet   Oral   Take 10 mg by mouth daily.         Marland Kitchen lovastatin (MEVACOR) 10 MG tablet   Oral   Take 10 mg by mouth daily.         . megestrol (MEGACE) 40 MG/ML suspension   Oral   Take 200 mg by mouth daily.         . methocarbamol (ROBAXIN) 500 MG tablet   Oral   Take 1 tablet (500 mg total) by  mouth 3 (three) times daily.   60 tablet   5   . omeprazole (PRILOSEC) 20 MG capsule   Oral   Take 1 capsule (20 mg total) by mouth daily.   30 capsule   11   . ondansetron (ZOFRAN ODT) 8 MG disintegrating tablet   Oral   Take 1 tablet (8 mg total) by mouth every 8 (eight) hours as needed for nausea.   60 tablet   0   . ondansetron (ZOFRAN) 4 MG tablet   Oral   Take 1 tablet (4 mg total) by mouth every 6 (six) hours as needed for nausea or vomiting.   12 tablet   0   . oxyCODONE-acetaminophen (PERCOCET/ROXICET) 5-325 MG per tablet   Oral   Take 1 tablet by mouth every 4 (four) hours as needed for moderate pain or severe pain.   10 tablet   0   . polyethylene glycol-electrolytes (TRILYTE) 420 G solution   Oral   Take 4,000 mLs by mouth as directed.   4000 mL   0   . promethazine (PHENERGAN) 25 MG suppository   Rectal   Place 1 suppository (25 mg total) rectally every 6 (six) hours as needed for nausea.   12 each   0   . promethazine (PHENERGAN) 25 MG tablet   Oral   Take 1 tablet (25 mg total) by mouth every 6 (six) hours as needed for nausea.   20 tablet   0    BP 177/90  Pulse 77  Temp(Src) 98.4 F (36.9 C) (Oral)  Resp 22  Ht 5\' 6"  (1.676 m)  Wt 133 lb (60.328 kg)  BMI 21.48 kg/m2  SpO2 100%  Vital signs normal     Physical Exam  Nursing note and vitals reviewed. Constitutional: She is oriented to person, place, and time. She appears well-developed and well-nourished.  Non-toxic appearance. She does not appear ill. No distress.  Pt is very dramatic. Excessive dry heaving.  HENT:  Head: Normocephalic and atraumatic.  Right Ear: External ear normal.  Left Ear: External ear normal.  Nose: Nose normal. No mucosal edema or rhinorrhea.  Mouth/Throat: Oropharynx is clear and moist and mucous membranes are normal. No dental abscesses or uvula swelling.  Eyes: Conjunctivae and EOM are normal. Pupils are equal, round, and reactive to light.  Neck:  Normal range of motion and full passive range  of motion without pain. Neck supple.  Cardiovascular: Normal rate, regular rhythm and normal heart sounds.  Exam reveals no gallop and no friction rub.   No murmur heard. Pulmonary/Chest: Effort normal and breath sounds normal. No respiratory distress. She has no wheezes. She has no rhonchi. She has no rales. She exhibits no tenderness and no crepitus.  Abdominal: Soft. Normal appearance and bowel sounds are normal. She exhibits no distension. There is tenderness. There is no rebound and no guarding.  Abdominal area tender throughout.   Musculoskeletal: Normal range of motion. She exhibits no edema and no tenderness.  Moves all extremities well.   Neurological: She is alert and oriented to person, place, and time. She has normal strength. No cranial nerve deficit.  Skin: Skin is warm, dry and intact. No rash noted. No erythema. No pallor.  Psychiatric: She has a normal mood and affect. Her speech is normal and behavior is normal. Her mood appears not anxious.    ED Course  Procedures (including critical care time)  Medications  0.9 %  sodium chloride infusion (0 mLs Intravenous Stopped 06/23/13 0015)    Followed by  0.9 %  sodium chloride infusion (not administered)    Followed by  0.9 %  sodium chloride infusion (not administered)  ketorolac (TORADOL) 30 MG/ML injection 30 mg (30 mg Intravenous Given 06/22/13 1955)  metoCLOPramide (REGLAN) injection 10 mg (10 mg Intravenous Given 06/22/13 1956)  diphenhydrAMINE (BENADRYL) injection 25 mg (25 mg Intravenous Given 06/22/13 1956)  LORazepam (ATIVAN) injection 1 mg (1 mg Intravenous Given 06/22/13 1956)  sodium chloride 0.9 % bolus 1,000 mL (1,000 mLs Intravenous New Bag/Given 06/23/13 0015)  metoCLOPramide (REGLAN) injection 10 mg (10 mg Intravenous Given 06/23/13 0044)  diphenhydrAMINE (BENADRYL) injection 25 mg (25 mg Intravenous Given 06/23/13 0046)    DIAGNOSTIC STUDIES: Oxygen  Saturation is 100% on room air, normal by my interpretation.    COORDINATION OF CARE: 7:16 PM-Discussed treatment plan with pt at bedside and pt agreed to plan.  11:12 PM updated pt about her potassium levels being low. Pt stated she feels like she needs more fluids.   Review of [place] database shows she gets hydrocodone 5/325 from Dr Romeo Apple, the last was #120 on 10/21, she gets # 60-90 every 2 weeks.   Patient spent most of her ED visit sleeping. She unrealistically wants her abdominal wall soreness to go away completely. She's advised her muscles are sore from the vomiting and they will be sore for several days.  Labs Review  Results for orders placed during the hospital encounter of 06/22/13  URINALYSIS, ROUTINE W REFLEX MICROSCOPIC      Result Value Range   Color, Urine YELLOW  YELLOW   APPearance CLOUDY (*) CLEAR   Specific Gravity, Urine 1.020  1.005 - 1.030   pH 7.0  5.0 - 8.0   Glucose, UA NEGATIVE  NEGATIVE mg/dL   Hgb urine dipstick NEGATIVE  NEGATIVE   Bilirubin Urine NEGATIVE  NEGATIVE   Ketones, ur NEGATIVE  NEGATIVE mg/dL   Protein, ur NEGATIVE  NEGATIVE mg/dL   Urobilinogen, UA 2.0 (*) 0.0 - 1.0 mg/dL   Nitrite NEGATIVE  NEGATIVE   Leukocytes, UA NEGATIVE  NEGATIVE  URINE RAPID DRUG SCREEN (HOSP PERFORMED)      Result Value Range   Opiates POSITIVE (*) NONE DETECTED   Cocaine NONE DETECTED  NONE DETECTED   Benzodiazepines NONE DETECTED  NONE DETECTED   Amphetamines NONE DETECTED  NONE DETECTED   Tetrahydrocannabinol POSITIVE (*)  NONE DETECTED   Barbiturates NONE DETECTED  NONE DETECTED  CBC WITH DIFFERENTIAL      Result Value Range   WBC 7.9  4.0 - 10.5 K/uL   RBC 4.52  3.87 - 5.11 MIL/uL   Hemoglobin 14.6  12.0 - 15.0 g/dL   HCT 16.1  09.6 - 04.5 %   MCV 90.7  78.0 - 100.0 fL   MCH 32.3  26.0 - 34.0 pg   MCHC 35.6  30.0 - 36.0 g/dL   RDW 40.9  81.1 - 91.4 %   Platelets 158  150 - 400 K/uL   Neutrophils Relative % 68  43 - 77 %   Neutro Abs 5.4  1.7 - 7.7  K/uL   Lymphocytes Relative 21  12 - 46 %   Lymphs Abs 1.7  0.7 - 4.0 K/uL   Monocytes Relative 10  3 - 12 %   Monocytes Absolute 0.8  0.1 - 1.0 K/uL   Eosinophils Relative 1  0 - 5 %   Eosinophils Absolute 0.1  0.0 - 0.7 K/uL   Basophils Relative 0  0 - 1 %   Basophils Absolute 0.0  0.0 - 0.1 K/uL  COMPREHENSIVE METABOLIC PANEL      Result Value Range   Sodium 135  135 - 145 mEq/L   Potassium 3.0 (*) 3.5 - 5.1 mEq/L   Chloride 100  96 - 112 mEq/L   CO2 22  19 - 32 mEq/L   Glucose, Bld 102 (*) 70 - 99 mg/dL   BUN 12  6 - 23 mg/dL   Creatinine, Ser 7.82  0.50 - 1.10 mg/dL   Calcium 95.6 (*) 8.4 - 10.5 mg/dL   Total Protein 8.6 (*) 6.0 - 8.3 g/dL   Albumin 4.2  3.5 - 5.2 g/dL   AST 16  0 - 37 U/L   ALT 21  0 - 35 U/L   Alkaline Phosphatase 60  39 - 117 U/L   Total Bilirubin 1.5 (*) 0.3 - 1.2 mg/dL   GFR calc non Af Amer >90  >90 mL/min   GFR calc Af Amer >90  >90 mL/min    Laboratory interpretation all normal except hypokalemia   Imaging Review Dg Abd Acute W/chest  06/22/2013   CLINICAL DATA:  Abdominal pain and vomit  EXAM: ACUTE ABDOMEN SERIES (ABDOMEN 2 VIEW & CHEST 1 VIEW)  COMPARISON:  Chest x-ray and abdominal CT 11/27/2012.  FINDINGS: No infiltrate or edema. No effusion or pneumothorax. Normal heart size. Bilateral nipple shadows.  No pneumoperitoneum or pneumatosis. No abnormal intra-abdominal mass effect. Coarse calcifications in the pelvis correlates with hyalinized fibroids. Nonobstructive bowel gas pattern.  IMPRESSION: 1. Nonobstructive bowel gas pattern. 2. No evidence acute cardiopulmonary disease.   Electronically Signed   By: Tiburcio Pea M.D.   On: 06/22/2013 06:17    EKG Interpretation   None       MDM   1. Nausea and vomiting   2. Generalized abdominal wall pain   3. Hypokalemia    New Prescriptions   CYCLOBENZAPRINE (FLEXERIL) 10 MG TABLET    Take 1 tablet (10 mg total) by mouth 3 (three) times daily as needed for muscle spasms.   NAPROXEN  (NAPROSYN) 500 MG TABLET    Take 1 tablet (500 mg total) by mouth 2 (two) times daily.   ONDANSETRON (ZOFRAN) 4 MG TABLET    Take 1 tablet (4 mg total) by mouth every 6 (six) hours.   POTASSIUM CHLORIDE SA (  K-DUR,KLOR-CON) 20 MEQ TABLET    Take 1 tablet (20 mEq total) by mouth 2 (two) times daily.    Plan discharge   Devoria Albe, MD, FACEP   I personally performed the services described in this documentation, which was scribed in my presence. The recorded information has been reviewed and considered.  Devoria Albe, MD, [facility]    Ward Givens, MD 06/23/13 [phone]

## 2013-06-22 NOTE — ED Notes (Signed)
History of chronic nausea/vomiting.  Was in ER last night, given fluids, nausea meds and pain meds.  States she felt better when she left, but vomiting began again 1 hours ago.

## 2013-06-23 MED ORDER — DIPHENHYDRAMINE HCL 50 MG/ML IJ SOLN
25.0000 mg | Freq: Once | INTRAMUSCULAR | Status: AC
  Administered 2013-06-23: 25 mg via INTRAVENOUS
  Filled 2013-06-23: qty 1

## 2013-06-23 MED ORDER — NAPROXEN 500 MG PO TABS: 500.0000 mg | ORAL_TABLET | Freq: Two times a day (BID) | ORAL | Status: DC

## 2013-06-23 MED ORDER — DM-GUAIFENESIN ER 30-600 MG PO TB12: 1.0000 | ORAL_TABLET | Freq: Two times a day (BID) | ORAL | Status: DC

## 2013-06-23 MED ORDER — POTASSIUM CHLORIDE CRYS ER 20 MEQ PO TBCR: 20.0000 meq | EXTENDED_RELEASE_TABLET | Freq: Two times a day (BID) | ORAL | Status: DC

## 2013-06-23 MED ORDER — ONDANSETRON HCL 4 MG PO TABS: 4.0000 mg | ORAL_TABLET | Freq: Four times a day (QID) | ORAL | Status: DC

## 2013-06-23 MED ORDER — METOCLOPRAMIDE HCL 5 MG/ML IJ SOLN
10.0000 mg | Freq: Once | INTRAMUSCULAR | Status: AC
  Administered 2013-06-23: 10 mg via INTRAVENOUS
  Filled 2013-06-23: qty 2

## 2013-06-23 MED ORDER — CYCLOBENZAPRINE HCL 10 MG PO TABS: 10.0000 mg | ORAL_TABLET | Freq: Three times a day (TID) | ORAL | Status: DC | PRN

## 2013-06-23 NOTE — ED Notes (Signed)
Pt calling a ride to come pick her up.

## 2013-06-23 NOTE — ED Notes (Signed)
Pt requesting pain meds

## 2013-07-19 ENCOUNTER — Ambulatory Visit (INDEPENDENT_AMBULATORY_CARE_PROVIDER_SITE_OTHER): Payer: Medicare Other | Admitting: Orthopedic Surgery

## 2013-07-19 VITALS — BP 154/93 | Ht 66.0 in | Wt 131.0 lb

## 2013-07-19 DIAGNOSIS — M5410 Radiculopathy, site unspecified: Secondary | ICD-10-CM

## 2013-07-19 DIAGNOSIS — M549 Dorsalgia, unspecified: Secondary | ICD-10-CM

## 2013-07-19 DIAGNOSIS — IMO0002 Reserved for concepts with insufficient information to code with codable children: Secondary | ICD-10-CM

## 2013-07-19 MED ORDER — METHOCARBAMOL 500 MG PO TABS: 500.0000 mg | ORAL_TABLET | Freq: Three times a day (TID) | ORAL | Status: DC

## 2013-07-19 MED ORDER — HYDROCODONE-ACETAMINOPHEN 5-325 MG PO TABS: 1.0000 | ORAL_TABLET | Freq: Four times a day (QID) | ORAL | Status: DC | PRN

## 2013-07-19 NOTE — Patient Instructions (Signed)
Continue robaxin and norco

## 2013-07-19 NOTE — Progress Notes (Signed)
Patient ID: Colleen Lee, female   DOB: June 25, 1956, 57 y.o.   MRN: [id number]  Chief Complaint  Patient presents with  . Follow-up    4 week recheck back    HISTORY: This patient continues to have back and leg pain today back greater than leg. She's had 3 epidural injections. She is on Norco and Robaxin. She's continued to have radicular symptoms but her husband is having surgery and she doesn't want to see a neurosurgeon at this point because she is to take care of him. She continues with radicular symptoms in the left lower extremity although she has normal range of motion her hip fairly normal range of motion in the knee. No real weakness skin is intact joints are stable on the left side her ambulation is without assistive device he is awake alert and oriented x3 mood and affect are normal vital signs are stable. Sensation is normal in the left leg pulses noted  Recommend continue current medications of Robaxin and Norco followup in about a month

## 2013-07-24 ENCOUNTER — Telehealth: Payer: Self-pay | Admitting: Gastroenterology

## 2013-07-24 ENCOUNTER — Ambulatory Visit: Payer: Medicare Other | Admitting: Gastroenterology

## 2013-07-24 ENCOUNTER — Encounter: Payer: Self-pay | Admitting: Gastroenterology

## 2013-07-24 NOTE — Telephone Encounter (Signed)
Pt was a no show

## 2013-07-24 NOTE — Telephone Encounter (Signed)
MAILED LETTER °

## 2013-08-28 ENCOUNTER — Ambulatory Visit (INDEPENDENT_AMBULATORY_CARE_PROVIDER_SITE_OTHER): Payer: Medicare Other | Admitting: Orthopedic Surgery

## 2013-08-28 ENCOUNTER — Encounter: Payer: Self-pay | Admitting: Orthopedic Surgery

## 2013-08-28 ENCOUNTER — Other Ambulatory Visit: Payer: Self-pay | Admitting: *Deleted

## 2013-08-28 VITALS — BP 148/87 | Ht 66.0 in | Wt 131.0 lb

## 2013-08-28 DIAGNOSIS — IMO0002 Reserved for concepts with insufficient information to code with codable children: Secondary | ICD-10-CM

## 2013-08-28 DIAGNOSIS — M5410 Radiculopathy, site unspecified: Secondary | ICD-10-CM

## 2013-08-28 DIAGNOSIS — M549 Dorsalgia, unspecified: Secondary | ICD-10-CM

## 2013-08-28 MED ORDER — HYDROCODONE-ACETAMINOPHEN 5-325 MG PO TABS: 1.0000 | ORAL_TABLET | Freq: Four times a day (QID) | ORAL | Status: DC | PRN

## 2013-08-28 MED ORDER — CYCLOBENZAPRINE HCL 10 MG PO TABS: 10.0000 mg | ORAL_TABLET | Freq: Three times a day (TID) | ORAL | Status: DC | PRN

## 2013-08-28 MED ORDER — METHOCARBAMOL 500 MG PO TABS: 500.0000 mg | ORAL_TABLET | Freq: Three times a day (TID) | ORAL | Status: DC

## 2013-08-28 NOTE — Patient Instructions (Signed)
Continue Robaxin and Norco for pain return in 2 months

## 2013-08-28 NOTE — Progress Notes (Signed)
Patient ID: Colleen Lee, female   DOB: Mar 24, 1956, 58 y.o.   MRN: [id number]  Chief Complaint  Patient presents with  . Follow-up    one month follow up back   BP 148/87  Ht 5\' 6"  (1.676 m)  Wt 131 lb (59.421 kg)  BMI 21.15 kg/m2 Encounter Diagnoses  Name Primary?  . Back pain Yes  . Radicular pain of left lower extremity      The patient's pain it has persisted and continues she gets fairly decent relief with Norco and Robaxin. Her husband has done well after surgery.  Review of systems no bowel or bladder dysfunction  She will need a prescription refill. Her established problem has not changed.

## 2013-09-22 ENCOUNTER — Emergency Department (HOSPITAL_COMMUNITY)
Admission: EM | Admit: 2013-09-22 | Discharge: 2013-09-22 | Disposition: A | Discharge status: Home / Self Care | Payer: Medicare Other | Attending: Emergency Medicine | Admitting: Emergency Medicine

## 2013-09-22 ENCOUNTER — Encounter (HOSPITAL_COMMUNITY): Payer: Self-pay | Admitting: Emergency Medicine

## 2013-09-22 DIAGNOSIS — Z880 Allergy status to penicillin: Secondary | ICD-10-CM | POA: Insufficient documentation

## 2013-09-22 DIAGNOSIS — J45909 Unspecified asthma, uncomplicated: Secondary | ICD-10-CM | POA: Insufficient documentation

## 2013-09-22 DIAGNOSIS — Z9889 Other specified postprocedural states: Secondary | ICD-10-CM | POA: Insufficient documentation

## 2013-09-22 DIAGNOSIS — R109 Unspecified abdominal pain: Secondary | ICD-10-CM

## 2013-09-22 DIAGNOSIS — H109 Unspecified conjunctivitis: Secondary | ICD-10-CM

## 2013-09-22 DIAGNOSIS — R112 Nausea with vomiting, unspecified: Secondary | ICD-10-CM | POA: Insufficient documentation

## 2013-09-22 DIAGNOSIS — F1120 Opioid dependence, uncomplicated: Secondary | ICD-10-CM | POA: Insufficient documentation

## 2013-09-22 DIAGNOSIS — Z79899 Other long term (current) drug therapy: Secondary | ICD-10-CM | POA: Insufficient documentation

## 2013-09-22 DIAGNOSIS — Z8619 Personal history of other infectious and parasitic diseases: Secondary | ICD-10-CM | POA: Insufficient documentation

## 2013-09-22 DIAGNOSIS — Z8719 Personal history of other diseases of the digestive system: Secondary | ICD-10-CM | POA: Insufficient documentation

## 2013-09-22 DIAGNOSIS — R1110 Vomiting, unspecified: Secondary | ICD-10-CM

## 2013-09-22 DIAGNOSIS — F17200 Nicotine dependence, unspecified, uncomplicated: Secondary | ICD-10-CM | POA: Insufficient documentation

## 2013-09-22 DIAGNOSIS — I10 Essential (primary) hypertension: Secondary | ICD-10-CM | POA: Insufficient documentation

## 2013-09-22 DIAGNOSIS — Z8742 Personal history of other diseases of the female genital tract: Secondary | ICD-10-CM | POA: Insufficient documentation

## 2013-09-22 DIAGNOSIS — R1084 Generalized abdominal pain: Secondary | ICD-10-CM | POA: Insufficient documentation

## 2013-09-22 LAB — URINALYSIS, ROUTINE W REFLEX MICROSCOPIC
Bilirubin Urine: NEGATIVE
Glucose, UA: NEGATIVE mg/dL
Hgb urine dipstick: NEGATIVE
Ketones, ur: NEGATIVE mg/dL
Leukocytes, UA: NEGATIVE
Nitrite: NEGATIVE
Protein, ur: 30 mg/dL — AB
Specific Gravity, Urine: 1.025 (ref 1.005–1.030)
Urobilinogen, UA: 0.2 mg/dL (ref 0.0–1.0)
pH: 7 (ref 5.0–8.0)

## 2013-09-22 LAB — URINE MICROSCOPIC-ADD ON

## 2013-09-22 LAB — CBC
HCT: 43.5 % (ref 36.0–46.0)
Hemoglobin: 15.1 g/dL — ABNORMAL HIGH (ref 12.0–15.0)
MCH: 31.9 pg (ref 26.0–34.0)
MCHC: 34.7 g/dL (ref 30.0–36.0)
MCV: 91.8 fL (ref 78.0–100.0)
Platelets: 169 10*3/uL (ref 150–400)
RBC: 4.74 MIL/uL (ref 3.87–5.11)
RDW: 13.9 % (ref 11.5–15.5)
WBC: 8.4 10*3/uL (ref 4.0–10.5)

## 2013-09-22 LAB — COMPREHENSIVE METABOLIC PANEL
ALT: 26 U/L (ref 0–35)
AST: 26 U/L (ref 0–37)
Albumin: 4.3 g/dL (ref 3.5–5.2)
Alkaline Phosphatase: 76 U/L (ref 39–117)
BUN: 5 mg/dL — ABNORMAL LOW (ref 6–23)
CO2: 22 mEq/L (ref 19–32)
Calcium: 10.4 mg/dL (ref 8.4–10.5)
Chloride: 106 mEq/L (ref 96–112)
Creatinine, Ser: 0.59 mg/dL (ref 0.50–1.10)
GFR calc Af Amer: >90 mL/min (ref >90)
GFR calc non Af Amer: >90 mL/min (ref >90)
Glucose, Bld: 112 mg/dL — ABNORMAL HIGH (ref 70–99)
Potassium: 3.3 mEq/L — ABNORMAL LOW (ref 3.7–5.3)
Sodium: 141 mEq/L (ref 137–147)
Total Bilirubin: 0.7 mg/dL (ref 0.3–1.2)
Total Protein: 8.7 g/dL — ABNORMAL HIGH (ref 6.0–8.3)

## 2013-09-22 LAB — LIPASE, BLOOD: Lipase: 18 U/L (ref 11–59)

## 2013-09-22 MED ORDER — ONDANSETRON HCL 4 MG/2ML IJ SOLN
4.0000 mg | Freq: Once | INTRAMUSCULAR | Status: AC
Start: 2013-09-22 — End: 2013-09-22
  Administered 2013-09-22: 4 mg via INTRAVENOUS
  Filled 2013-09-22: qty 2

## 2013-09-22 MED ORDER — HYDROCODONE-ACETAMINOPHEN 5-325 MG PO TABS: 1.0000 | ORAL_TABLET | ORAL | Status: DC | PRN

## 2013-09-22 MED ORDER — SODIUM CHLORIDE 0.9 % IV BOLUS (SEPSIS)
1000.0000 mL | Freq: Once | INTRAVENOUS | Status: AC
  Administered 2013-09-22: 1000 mL via INTRAVENOUS

## 2013-09-22 MED ORDER — MORPHINE SULFATE 4 MG/ML IJ SOLN
4.0000 mg | Freq: Once | INTRAMUSCULAR | Status: AC
  Administered 2013-09-22: 4 mg via INTRAVENOUS
  Filled 2013-09-22: qty 1

## 2013-09-22 MED ORDER — ERYTHROMYCIN 5 MG/GM OP OINT: 1.0000 "application " | TOPICAL_OINTMENT | Freq: Four times a day (QID) | OPHTHALMIC | Status: DC

## 2013-09-22 MED ORDER — KETOROLAC TROMETHAMINE 30 MG/ML IJ SOLN
30.0000 mg | Freq: Once | INTRAMUSCULAR | Status: AC
Start: 2013-09-22 — End: 2013-09-22
  Administered 2013-09-22: 30 mg via INTRAVENOUS
  Filled 2013-09-22: qty 1

## 2013-09-22 MED ORDER — PROMETHAZINE HCL 25 MG/ML IJ SOLN
25.0000 mg | Freq: Once | INTRAMUSCULAR | Status: AC
  Administered 2013-09-22: 25 mg via INTRAVENOUS
  Filled 2013-09-22: qty 1

## 2013-09-22 NOTE — ED Provider Notes (Signed)
CSN: [id number]     Arrival date & time 09/22/13  0910 History   First MD Initiated Contact with Patient 09/22/13 0913     Chief Complaint  Patient presents with  . Abdominal Pain  . Emesis     (Consider location/radiation/quality/duration/timing/severity/associated sxs/prior Treatment) HPI Comments: Colleen Lee is a 58 year-old female with a past medical history of GERD, HTN, [name], presenting the Emergency Department with a chief complaint of vomiting 3 days.  The patient reports associated generalized abdominal pain, describes it as "soreness from vomiting".  She denies fever or chills.  Last BM was this morning, she described it as non-bloody and loose.  No known sick contacts. Upon a re-evaluation and further questioning the patient reports she is out of her oxycodone, last dose was over a week ago.  She also reports she has an appointment with her PCP on Monday. She also reports left eye redness and matting of her eye today upon waking.     Patient is a 58 y.o. female presenting with abdominal pain and vomiting. The history is provided by the patient and medical records. No language interpreter was used.  Abdominal Pain Associated symptoms: nausea and vomiting   Associated symptoms: no chills, no constipation, no cough, no diarrhea, no dysuria and no fever   Emesis Associated symptoms: abdominal pain   Associated symptoms: no chills and no diarrhea     Past Medical History  Diagnosis Date  . Hypertension   . Fibroids   . [name]     HCV RNA positive 09/2012  . GERD (gastroesophageal reflux disease)   . H. pylori infection 2014    treated with pylera  . Asthma    Past Surgical History  Procedure Laterality Date  . Knee surgery      left knee  . Colonoscopy  01/18/2008    GXQ:JJHERD rectum.  Long redundant colon, a diminutive sigmoid polyp status post cold biopsy removed. Hyperplastic polyp. Repeat colonoscopy June 2014 due to family history of colon cancer   . Esophagogastroduodenoscopy  01/18/2008    RMR: Normal esophagus, normal  stomach  . Colonoscopy with esophagogastroduodenoscopy (egd) N/A 11/02/2012    EYC:[id number] gastric mucosa of doubtful, +H.pylori. Incomplete colonoscopy due to patient unable to tolerate exam, proximal colon seen. Patient refused ACBE.  . Colonoscopy with propofol N/A 03/08/2013    RMR: colonic polyp-removed as scribed above. Internal Hemorrhoids  . Polypectomy N/A 03/08/2013    Procedure: POLYPECTOMY;  Surgeon: Daneil Dolin, MD;  Location: [facility];  Service: Endoscopy;  Laterality: N/A;   Family History  Problem Relation Age of Onset  . Colon cancer Brother        . Asthma      FH  . Multiple myeloma Brother   . Liver cancer Sister   . Prostate cancer Brother   . Pancreatic cancer Brother   . Cancer Other     niece, age 45, primary unknown   History  Substance Use Topics  . Smoking status: Current Every Day Smoker -- 0.50 packs/day for 40 years    Types: Cigarettes  . Smokeless tobacco: Not on file  . Alcohol Use: No     Comment: former, last Jan 2014   OB History   Grav Para Term Preterm Abortions TAB SAB Ect Mult Living                 Review of Systems  Constitutional: Negative for fever and chills.  Respiratory: Negative for  cough.   Gastrointestinal: Positive for nausea, vomiting and abdominal pain. Negative for diarrhea, constipation and blood in stool.  Genitourinary: Negative for dysuria and flank pain.  Skin: Negative for rash.      Allergies  Penicillins  Home Medications   Current Outpatient Rx  Name  Route  Sig  Dispense  Refill  . Cyanocobalamin (VITAMIN B 12 PO)   Oral   Take 1 tablet by mouth daily.         . cyclobenzaprine (FLEXERIL) 10 MG tablet   Oral   Take 1 tablet (10 mg total) by mouth 3 (three) times daily as needed for muscle spasms.   60 tablet   0   . gabapentin (NEURONTIN) 100 MG capsule   Oral   Take 1 capsule (100 mg total) by mouth 3 (three) times  daily.   90 capsule   5   . HYDROcodone-acetaminophen (NORCO/VICODIN) 5-325 MG per tablet   Oral   Take 2 tablets by mouth every 8 (eight) hours as needed for moderate pain.         Marland Kitchen lisinopril (PRINIVIL,ZESTRIL) 10 MG tablet   Oral   Take 10 mg by mouth daily.         Marland Kitchen lovastatin (MEVACOR) 10 MG tablet   Oral   Take 10 mg by mouth daily.         . megestrol (MEGACE) 40 MG/ML suspension   Oral   Take 200 mg by mouth daily.         . methocarbamol (ROBAXIN) 500 MG tablet   Oral   Take 1 tablet (500 mg total) by mouth 3 (three) times daily.   60 tablet   5   . naproxen (NAPROSYN) 500 MG tablet   Oral   Take 1 tablet (500 mg total) by mouth 2 (two) times daily.   30 tablet   0   . ondansetron (ZOFRAN ODT) 8 MG disintegrating tablet   Oral   Take 1 tablet (8 mg total) by mouth every 8 (eight) hours as needed for nausea.   60 tablet   0   . oxyCODONE-acetaminophen (PERCOCET/ROXICET) 5-325 MG per tablet   Oral   Take 1 tablet by mouth every 4 (four) hours as needed for moderate pain or severe pain.   10 tablet   0   . potassium chloride SA (K-DUR,KLOR-CON) 20 MEQ tablet   Oral   Take 1 tablet (20 mEq total) by mouth 2 (two) times daily.   10 tablet   0   . HYDROcodone-acetaminophen (NORCO/VICODIN) 5-325 MG per tablet   Oral   Take 1 tablet by mouth every 4 (four) hours as needed.   10 tablet   0    BP 167/88  Pulse 74  Temp(Src) 97.9 F (36.6 C) (Oral)  Resp 20  Ht $R'5\' 6"'Es$  (1.676 m)  Wt 140 lb (63.504 kg)  BMI 22.61 kg/m2  SpO2 96% Physical Exam  Nursing note and vitals reviewed. Constitutional: She is oriented to person, place, and time. She appears well-developed and well-nourished. No distress.  HENT:  Head: Normocephalic and atraumatic.  Eyes: EOM are normal. Pupils are equal, round, and reactive to light. Left eye exhibits discharge and exudate. Right conjunctiva is not injected. Right conjunctiva has no hemorrhage. Left conjunctiva is  injected. Left conjunctiva has no hemorrhage. No scleral icterus.  Neck: Neck supple.  Cardiovascular: Normal rate, regular rhythm and normal heart sounds.   No murmur heard. Pulmonary/Chest: Effort normal and  breath sounds normal. She has no wheezes.  Abdominal: Soft. Normal appearance and bowel sounds are normal. There is generalized tenderness. There is no rebound, no guarding, no tenderness at McBurney's point and negative Murphy's sign.  Musculoskeletal: Normal range of motion. She exhibits no edema.  Neurological: She is alert and oriented to person, place, and time.  Skin: Skin is warm and dry. No rash noted.  Psychiatric: She has a normal mood and affect.    ED Course  Procedures (including critical care time) Labs Review Labs Reviewed  COMPREHENSIVE METABOLIC PANEL - Abnormal; Notable for the following:    Potassium 3.3 (*)    Glucose, Bld 112 (*)    BUN 5 (*)    Total Protein 8.7 (*)    All other components within normal limits  URINALYSIS, ROUTINE W REFLEX MICROSCOPIC - Abnormal; Notable for the following:    APPearance HAZY (*)    Protein, ur 30 (*)    All other components within normal limits  CBC - Abnormal; Notable for the following:    Hemoglobin 15.1 (*)    All other components within normal limits  URINE MICROSCOPIC-ADD ON - Abnormal; Notable for the following:    Squamous Epithelial / LPF MANY (*)    Bacteria, UA FEW (*)    All other components within normal limits  LIPASE, BLOOD   Imaging Review No results found.  EKG Interpretation   None       MDM   Final diagnoses:  Vomiting  Abdominal pain  Narcotic dependence  Conjunctivitis   Pt reports multiple episodes of vomiting for 3 days, she reports generalized abdominal pain.  Pain medication, nausea medication, labs, and fluids ordered. EMR shows several visits tot he ED for similar complaints in the past. Re-eval: patient requesting more pain medication.   Morphine ordered. CMP shows slight  hypokalemia 3.3, history of hypokalemia. CBC stable from previous results.  Negative lipase, UA, shows proteinuria, and few bacteria. Not a clean catch.  Normal specific gravity, patient is hydrated. Patient reports that she "ran out" of her oxycodone several days ago and is requesting narcotics until her PCP visit on 09/24/2013. Had a long discussion with her about finding a chronic pain doctor or getting on a regimen that would last for the entire month. Nausea, vomiting, and diarrhea likely due to narcotic withdrawal. Left eye with likely conjunctivitis with treat with erythromycin ointment.  Discussed lab results, and treatment plan with the patient. Return precautions given. Reports understanding and no other concerns at this time.  Patient is stable for discharge at this time.   Meds given in ED:  Medications  sodium chloride 0.9 % bolus 1,000 mL (0 mLs Intravenous Stopped 09/22/13 1056)  ondansetron (ZOFRAN) injection 4 mg (4 mg Intravenous Given 09/22/13 0947)  ketorolac (TORADOL) 30 MG/ML injection 30 mg (30 mg Intravenous Given 09/22/13 0947)  promethazine (PHENERGAN) injection 25 mg (25 mg Intravenous Given 09/22/13 1020)  morphine 4 MG/ML injection 4 mg (4 mg Intravenous Given 09/22/13 1027)    Discharge Medication List as of 09/22/2013 11:34 AM    START taking these medications   Details  erythromycin ophthalmic ointment Place 1 application into the left eye every 6 (six) hours. Place 1/2 inch ribbon of ointment in the affected eye 4 times a day for 1 week., Starting 09/22/2013, Until Discontinued, Print            Lorrine Kin, PA-C 09/22/13 [place], PA-C 09/22/13 1626

## 2013-09-22 NOTE — ED Notes (Addendum)
Pt c/o n/v abd pain that started Wednesday, states "I have problems with my stomach and vomiting all the time", denies any fever, diarrhea, states that she has been using zofran odt without improvement in symptoms,  Pt also c/o redness, irritation to left eye that started this am,

## 2013-09-22 NOTE — Discharge Instructions (Signed)
Call for a follow up appointment with a Family or Primary Care Provider about your Narcotic regimen and why you are running out.  Return if Symptoms worsen.   Take medication as prescribed.

## 2013-09-23 NOTE — ED Provider Notes (Signed)
Medical screening examination/treatment/procedure(s) were performed by non-physician practitioner and as supervising physician I was immediately available for consultation/collaboration.  EKG Interpretation   None         Lanaysia Fritchman T Aryannah Mohon, MD 09/23/13 0658 

## 2013-09-24 ENCOUNTER — Other Ambulatory Visit: Payer: Self-pay | Admitting: *Deleted

## 2013-09-24 ENCOUNTER — Telehealth: Payer: Self-pay | Admitting: Orthopedic Surgery

## 2013-09-24 DIAGNOSIS — M549 Dorsalgia, unspecified: Secondary | ICD-10-CM

## 2013-09-24 MED ORDER — HYDROCODONE-ACETAMINOPHEN 5-325 MG PO TABS: 1.0000 | ORAL_TABLET | ORAL | Status: DC | PRN

## 2013-09-24 NOTE — Telephone Encounter (Signed)
Refilled per Dr. Aline Brochure, and advised patient that she could pick up prescription 09/25/13.

## 2013-09-24 NOTE — Telephone Encounter (Signed)
Approved.  

## 2013-09-24 NOTE — Telephone Encounter (Signed)
Beverlyn Roux wants a prescription for Hydrocodone 5/325

## 2013-09-24 NOTE — Telephone Encounter (Signed)
Routing to Dr Harrison 

## 2013-09-26 NOTE — Telephone Encounter (Signed)
Prescription picked up by the patient 09/25/13

## 2013-10-04 ENCOUNTER — Encounter (HOSPITAL_COMMUNITY): Payer: Self-pay | Admitting: Pharmacy Technician

## 2013-10-10 NOTE — Pre-Procedure Instructions (Signed)
Colleen Lee  10/10/2013   Your procedure is scheduled on:  Mon, Mar 16 @ 9:00 AM  Report to [facility]  at 6:00 AM.  Call this number if you have problems the morning of surgery: [phone]   Remember:   Do not eat food or drink liquids after midnight.   Take these medicines the morning of surgery with A SIP OF WATER: Gabapentin(Neurontin),Pain Pill(if needed),and Zofran(Ondansetron-if needed)               No Goody's,BC's,Aleve,Aspirin,Ibuprofen,Fish Oil,or any Herbal Medications   Do not wear jewelry, make-up or nail polish.  Do not wear lotions, powders, or perfumes. You may wear deodorant.  Do not shave 48 hours prior to surgery.   Do not bring valuables to the hospital.  [facility] is not responsible                  for any belongings or valuables.               Contacts, dentures or bridgework may not be worn into surgery.  Leave suitcase in the car. After surgery it may be brought to your room.  For patients admitted to the hospital, discharge time is determined by your                treatment team.               Patients discharged the day of surgery will not be allowed to drive  home.    Special Instructions:  Colleen Lee - Preparing for Surgery  Before surgery, you can play an important role.  Because skin is not sterile, your skin needs to be as free of germs as possible.  You can reduce the number of germs on you skin by washing with CHG (chlorahexidine gluconate) soap before surgery.  CHG is an antiseptic cleaner which kills germs and bonds with the skin to continue killing germs even after washing.  Please DO NOT use if you have an allergy to CHG or antibacterial soaps.  If your skin becomes reddened/irritated stop using the CHG and inform your nurse when you arrive at Short Stay.  Do not shave (including legs and underarms) for at least 48 hours prior to the first CHG shower.  You may shave your face.  Please follow these instructions  carefully:   1.  Shower with CHG Soap the night before surgery and the                                morning of Surgery.  2.  If you choose to wash your hair, wash your hair first as usual with your       normal shampoo.  3.  After you shampoo, rinse your hair and body thoroughly to remove the                      Shampoo.  4.  Use CHG as you would any other liquid soap.  You can apply chg directly       to the skin and wash gently with scrungie or a clean washcloth.  5.  Apply the CHG Soap to your body ONLY FROM THE NECK DOWN.        Do not use on open wounds or open sores.  Avoid contact with your eyes,       ears, mouth and  genitals (private parts).  Wash genitals (private parts)       with your normal soap.  6.  Wash thoroughly, paying special attention to the area where your surgery        will be performed.  7.  Thoroughly rinse your body with warm water from the neck down.  8.  DO NOT shower/wash with your normal soap after using and rinsing off       the CHG Soap.  9.  Pat yourself dry with a clean towel.            10.  Wear clean pajamas.            11.  Place clean sheets on your bed the night of your first shower and do not        sleep with pets.  Day of Surgery  Do not apply any lotions/deoderants the morning of surgery.  Please wear clean clothes to the hospital/surgery center.     Please read over the following fact sheets that you were given: Pain Booklet, Coughing and Deep Breathing and Surgical Site Infection Prevention

## 2013-10-10 NOTE — H&P (Signed)
HISTORY AND PHYSICAL  Colleen Lee is a 58 y.o. female patient with CC: Painful teeth  No diagnosis found.  Past Medical History  Diagnosis Date  . Hypertension   . Fibroids   . [name]     HCV RNA positive 09/2012  . GERD (gastroesophageal reflux disease)   . H. pylori infection 2014    treated with pylera  . Asthma     No current facility-administered medications for this encounter.   Current Outpatient Prescriptions  Medication Sig Dispense Refill  . Cyanocobalamin (VITAMIN B 12 PO) Take 1 tablet by mouth daily.      . cyclobenzaprine (FLEXERIL) 10 MG tablet Take 1 tablet (10 mg total) by mouth 3 (three) times daily as needed for muscle spasms.  60 tablet  0  . gabapentin (NEURONTIN) 100 MG capsule Take 1 capsule (100 mg total) by mouth 3 (three) times daily.  90 capsule  5  . HYDROcodone-acetaminophen (NORCO/VICODIN) 5-325 MG per tablet Take 1 tablet by mouth every 4 (four) hours as needed for moderate pain.      Marland Kitchen lisinopril (PRINIVIL,ZESTRIL) 10 MG tablet Take 10 mg by mouth daily.      Marland Kitchen lovastatin (MEVACOR) 10 MG tablet Take 10 mg by mouth daily.      . megestrol (MEGACE) 40 MG/ML suspension Take 200 mg by mouth daily.      . methocarbamol (ROBAXIN) 500 MG tablet Take 1 tablet (500 mg total) by mouth 3 (three) times daily.  60 tablet  5  . naproxen (NAPROSYN) 500 MG tablet Take 1 tablet (500 mg total) by mouth 2 (two) times daily.  30 tablet  0  . ondansetron (ZOFRAN ODT) 8 MG disintegrating tablet Take 1 tablet (8 mg total) by mouth every 8 (eight) hours as needed for nausea.  60 tablet  0  . oxyCODONE-acetaminophen (PERCOCET/ROXICET) 5-325 MG per tablet Take 1 tablet by mouth every 4 (four) hours as needed for moderate pain or severe pain.  10 tablet  0  . potassium chloride SA (K-DUR,KLOR-CON) 20 MEQ tablet Take 1 tablet (20 mEq total) by mouth 2 (two) times daily.  10 tablet  0   Allergies  Allergen Reactions  . Penicillins Rash   Active Problems:   * No  active hospital problems. *  Vitals: There were no vitals taken for this visit. Lab results:No results found for this or any previous visit (from the past 26 hour(s)). Radiology Results: No results found. General appearance: alert and cooperative Head: Normocephalic, without obvious abnormality, atraumatic Eyes: negative Nose: Nares normal. Septum midline. Mucosa normal. No drainage or sinus tenderness. Throat: dental caries, periodontitis.pharynx clear, no masses or lesions. Neck: no adenopathy, supple, symmetrical, trachea midline and thyroid not enlarged, symmetric, no tenderness/mass/nodules Resp: clear to auscultation bilaterally Cardio: regular rate and rhythm, S1, S2 normal, no murmur, click, rub or gallop  Assessment: non-restorable teeth #'s 1, 5, 7, 8, 9, 10, 12, 20, 21, 23, 26, 31.  Plan: Dental extractions with alveoloplasty. Day surgery. General anesthesia.   Gae Bon 10/10/2013

## 2013-10-11 ENCOUNTER — Encounter: Payer: Self-pay | Admitting: Gastroenterology

## 2013-10-11 ENCOUNTER — Encounter (HOSPITAL_COMMUNITY)
Admission: RE | Admit: 2013-10-11 | Discharge: 2013-10-11 | Disposition: A | Discharge status: Home / Self Care | Payer: Medicare Other | Source: Ambulatory Visit | Attending: Anesthesiology | Admitting: Anesthesiology

## 2013-10-11 ENCOUNTER — Encounter (HOSPITAL_COMMUNITY)
Admission: RE | Admit: 2013-10-11 | Discharge: 2013-10-11 | Disposition: A | Discharge status: Home / Self Care | Payer: Medicare Other | Source: Ambulatory Visit | Attending: Oral Surgery | Admitting: Oral Surgery

## 2013-10-11 ENCOUNTER — Ambulatory Visit (INDEPENDENT_AMBULATORY_CARE_PROVIDER_SITE_OTHER): Payer: Medicare Other | Admitting: Gastroenterology

## 2013-10-11 ENCOUNTER — Encounter (HOSPITAL_COMMUNITY): Payer: Self-pay

## 2013-10-11 VITALS — BP 135/82 | HR 79 | Temp 97.2°F | Wt 151.4 lb

## 2013-10-11 DIAGNOSIS — K5900 Constipation, unspecified: Secondary | ICD-10-CM

## 2013-10-11 DIAGNOSIS — B182 Chronic viral hepatitis C: Secondary | ICD-10-CM

## 2013-10-11 DIAGNOSIS — Z01818 Encounter for other preprocedural examination: Secondary | ICD-10-CM | POA: Insufficient documentation

## 2013-10-11 DIAGNOSIS — Z01812 Encounter for preprocedural laboratory examination: Secondary | ICD-10-CM | POA: Insufficient documentation

## 2013-10-11 HISTORY — DX: Other specified postprocedural states: Z98.890

## 2013-10-11 HISTORY — DX: Nausea with vomiting, unspecified: R11.2

## 2013-10-11 LAB — BASIC METABOLIC PANEL
BUN: <3 mg/dL — ABNORMAL LOW (ref 6–23)
CO2: 24 mEq/L (ref 19–32)
Calcium: 9.9 mg/dL (ref 8.4–10.5)
Chloride: 102 mEq/L (ref 96–112)
Creatinine, Ser: 0.62 mg/dL (ref 0.50–1.10)
GFR calc Af Amer: >90 mL/min (ref >90)
GFR calc non Af Amer: >90 mL/min (ref >90)
Glucose, Bld: 84 mg/dL (ref 70–99)
Potassium: 4.2 mEq/L (ref 3.7–5.3)
Sodium: 140 mEq/L (ref 137–147)

## 2013-10-11 LAB — CBC
HCT: 39.6 % (ref 36.0–46.0)
Hemoglobin: 13.4 g/dL (ref 12.0–15.0)
MCH: 31.9 pg (ref 26.0–34.0)
MCHC: 33.8 g/dL (ref 30.0–36.0)
MCV: 94.3 fL (ref 78.0–100.0)
Platelets: 197 10*3/uL (ref 150–400)
RBC: 4.2 MIL/uL (ref 3.87–5.11)
RDW: 14.7 % (ref 11.5–15.5)
WBC: 5.6 10*3/uL (ref 4.0–10.5)

## 2013-10-11 MED ORDER — LINACLOTIDE 290 MCG PO CAPS: 290.0000 ug | ORAL_CAPSULE | Freq: Every day | ORAL | Status: DC

## 2013-10-11 NOTE — Patient Instructions (Signed)
I have increased your Linzess to 290 mcg daily. We have provided samples and sent this to the pharmacy. Stop taking the 145 mcg capsules.  Please complete the Hepatitis B vaccination at Dr. Josephine Cables office.   We will research insurance coverage for [name] treatment here at our office. This is a medication called Harvoni.  Regardless, we will see you back in 3 months!

## 2013-10-11 NOTE — Progress Notes (Signed)
Referring Provider: Dr. Legrand Rams Primary GI: Dr. Gala Romney   Chief Complaint  Patient presents with  . Constipation    HPI:   Colleen Lee presents today at the request of Dr. Legrand Rams secondary to vomiting. History of chronic abdominal pain and constipation. Recent colonoscopy on file and due for surveillance in 2019. Chronic Hep C. Hx of H.pylori in past, treated with Pylera. Weight significantly improved from last visits with [place]. Reports difficulty with constipation. No BM in past several days. Feels bloated. Will take epsom salts to help. Stool softeners without improvement. Linzess 145 mcg daily prescribed at last visit without much improvement. States vomiting occurs with severe constipation. No rectal bleeding.   Hasn't had Hep B vaccination. Immune to Hep A. Last abdominal imaging Dec 2013.    Past Medical History  Diagnosis Date  . Hypertension   . Fibroids   . [name]     HCV RNA positive 09/2012  . GERD (gastroesophageal reflux disease)   . H. pylori infection 2014    treated with pylera  . Asthma   . PONV (postoperative nausea and vomiting)     pt thinks maybe once she had N&V    Past Surgical History  Procedure Laterality Date  . Knee surgery      left knee  . Colonoscopy  01/18/2008    IRS:WNIOEV rectum.  Long redundant colon, a diminutive sigmoid polyp status post cold biopsy removed. Hyperplastic polyp. Repeat colonoscopy June 2014 due to family history of colon cancer  . Esophagogastroduodenoscopy  01/18/2008    RMR: Normal esophagus, normal  stomach  . Colonoscopy with esophagogastroduodenoscopy (egd) N/A 11/02/2012    OJJ:[id number] gastric mucosa of doubtful, +H.pylori. Incomplete colonoscopy due to patient unable to tolerate exam, proximal colon seen. Patient refused ACBE.  . Colonoscopy with propofol N/A 03/08/2013    Dr. Gala Romney: colonic polyp-removed as scribed above. Internal Hemorrhoids. Pathology did not reveal any colonic tissue, only mucus. SURVEILLANCE  DUE Aug 2019  . Polypectomy N/A 03/08/2013    Procedure: POLYPECTOMY;  Surgeon: Daneil Dolin, MD;  Location: [facility];  Service: Endoscopy;  Laterality: N/A;    Current Outpatient Prescriptions  Medication Sig Dispense Refill  . Cyanocobalamin (VITAMIN B 12 PO) Take 1 tablet by mouth daily.      Marland Kitchen gabapentin (NEURONTIN) 100 MG capsule Take 1 capsule (100 mg total) by mouth 3 (three) times daily.  90 capsule  5  . HYDROcodone-acetaminophen (NORCO/VICODIN) 5-325 MG per tablet Take 1 tablet by mouth every 4 (four) hours as needed for moderate pain.      Marland Kitchen lisinopril (PRINIVIL,ZESTRIL) 10 MG tablet Take 10 mg by mouth daily.      Marland Kitchen lovastatin (MEVACOR) 10 MG tablet Take 10 mg by mouth daily.      . megestrol (MEGACE) 40 MG/ML suspension Take 200 mg by mouth daily.      . methocarbamol (ROBAXIN) 500 MG tablet Take 1 tablet (500 mg total) by mouth 3 (three) times daily.  60 tablet  5  . naproxen (NAPROSYN) 500 MG tablet Take 1 tablet (500 mg total) by mouth 2 (two) times daily.  30 tablet  0  . ondansetron (ZOFRAN ODT) 8 MG disintegrating tablet Take 1 tablet (8 mg total) by mouth every 8 (eight) hours as needed for nausea.  60 tablet  0  . oxyCODONE-acetaminophen (PERCOCET/ROXICET) 5-325 MG per tablet Take 1 tablet by mouth every 4 (four) hours as needed for moderate pain or severe pain.  10 tablet  0  . potassium chloride SA (K-DUR,KLOR-CON) 20 MEQ tablet Take 1 tablet (20 mEq total) by mouth 2 (two) times daily.  10 tablet  0  . Linaclotide (LINZESS) 290 MCG CAPS capsule Take 1 capsule (290 mcg total) by mouth daily. 30 minutes before breakfast.  30 capsule  5   No current facility-administered medications for this visit.   Facility-Administered Medications Ordered in Other Visits  Medication Dose Route Frequency Provider Last Rate Last Dose  . [START ON 10/15/2013] clindamycin (CLEOCIN) IVPB 900 mg  900 mg Intravenous 4 times per day Gae Bon, DDS        Allergies as of 10/11/2013 -  Review Complete 10/11/2013  Allergen Reaction Noted  . Penicillins Rash     Family History  Problem Relation Age of Onset  . Colon cancer Brother        . Asthma      FH  . Multiple myeloma Brother   . Liver cancer Sister   . Prostate cancer Brother   . Pancreatic cancer Brother   . Cancer Other     niece, age 4, primary unknown    History   Social History  . Marital Status: Married    Spouse Name: N/A    Number of Children: 0  . Years of Education: N/A   Occupational History  . umemployed     Social History Main Topics  . Smoking status: Current Every Day Smoker -- 1.00 packs/day for 40 years    Types: Cigarettes  . Smokeless tobacco: None  . Alcohol Use: No     Comment: former, last Jan 2014  . Drug Use: Yes    Special: Marijuana     Comment: marijuana  . Sexual Activity: No   Other Topics Concern  . None   Social History Narrative  . None    Review of Systems: As mentioned in HPI.   Physical Exam: BP 135/82  Pulse 79  Temp(Src) 97.2 F (36.2 C) (Oral)  Wt 151 lb 6.4 oz (68.675 kg) General:   Alert and oriented. No distress noted. Pleasant and cooperative.  Head:  Normocephalic and atraumatic. Eyes:  Conjuctiva clear without scleral icterus. Mouth:  Oral mucosa pink and moist. Good dentition. No lesions. Abdomen:  +BS, soft, non-tender and non-distended. No rebound or guarding. No HSM or masses noted. Msk:  Symmetrical without gross deformities. Normal posture. Extremities:  Without edema. Neurologic:  Alert and  oriented x4;  grossly normal neurologically. Skin:  Intact without significant lesions or rashes. Psych:  Alert and cooperative. Normal mood and affect.

## 2013-10-11 NOTE — Progress Notes (Signed)
Denies seeing a cardiologist. PCP is Dr Legrand Rams Denies having a stress test, echo, or card cath Denies having a recent CXR. EKG noted in epic from 03-05-13.

## 2013-10-14 ENCOUNTER — Encounter: Payer: Self-pay | Admitting: Gastroenterology

## 2013-10-14 MED ORDER — CLINDAMYCIN PHOSPHATE 900 MG/50ML IV SOLN
900.0000 mg | Freq: Four times a day (QID) | INTRAVENOUS | Status: AC
  Filled 2013-10-14: qty 50

## 2013-10-14 NOTE — Assessment & Plan Note (Signed)
Increase Linzess to 290 mcg daily. Next surveillance colonoscopy 2019. 3 month return.

## 2013-10-14 NOTE — Assessment & Plan Note (Signed)
Immune to Hep A, needs Hep B vaccination. Interested in treatment with [facility]. Briefly discussed treatment course. Will need to document if she has any evidence of cirrhosis, which would change treatment plan. Last abdominal imaging in 2013. Will discuss treatment with Dr. Gala Romney. Return in 3 months. Hep B vaccination provided to patient to take to PCP.

## 2013-10-15 ENCOUNTER — Ambulatory Visit (HOSPITAL_COMMUNITY)
Admission: RE | Admit: 2013-10-15 | Discharge: 2013-10-15 | Disposition: A | Discharge status: Home / Self Care | Payer: Medicare Other | Source: Ambulatory Visit | Attending: Oral Surgery | Admitting: Oral Surgery

## 2013-10-15 ENCOUNTER — Ambulatory Visit (HOSPITAL_COMMUNITY): Payer: Medicare Other | Admitting: Certified Registered"

## 2013-10-15 ENCOUNTER — Encounter (HOSPITAL_COMMUNITY)
Admission: RE | Disposition: A | Discharge status: Home / Self Care | Payer: Self-pay | Source: Ambulatory Visit | Attending: Oral Surgery

## 2013-10-15 ENCOUNTER — Encounter (HOSPITAL_COMMUNITY): Payer: Medicare Other | Admitting: Certified Registered"

## 2013-10-15 ENCOUNTER — Encounter (HOSPITAL_COMMUNITY): Payer: Self-pay | Admitting: Anesthesiology

## 2013-10-15 DIAGNOSIS — B1920 Unspecified viral hepatitis C without hepatic coma: Secondary | ICD-10-CM | POA: Insufficient documentation

## 2013-10-15 DIAGNOSIS — R140 Abdominal distension (gaseous): Secondary | ICD-10-CM

## 2013-10-15 DIAGNOSIS — S82143A Displaced bicondylar fracture of unspecified tibia, initial encounter for closed fracture: Secondary | ICD-10-CM

## 2013-10-15 DIAGNOSIS — I10 Essential (primary) hypertension: Secondary | ICD-10-CM | POA: Insufficient documentation

## 2013-10-15 DIAGNOSIS — R1013 Epigastric pain: Secondary | ICD-10-CM

## 2013-10-15 DIAGNOSIS — R630 Anorexia: Secondary | ICD-10-CM

## 2013-10-15 DIAGNOSIS — D259 Leiomyoma of uterus, unspecified: Secondary | ICD-10-CM | POA: Insufficient documentation

## 2013-10-15 DIAGNOSIS — M25569 Pain in unspecified knee: Secondary | ICD-10-CM

## 2013-10-15 DIAGNOSIS — K5900 Constipation, unspecified: Secondary | ICD-10-CM

## 2013-10-15 DIAGNOSIS — R6881 Early satiety: Secondary | ICD-10-CM

## 2013-10-15 DIAGNOSIS — K056 Periodontal disease, unspecified: Secondary | ICD-10-CM | POA: Insufficient documentation

## 2013-10-15 DIAGNOSIS — Z800 Family history of malignant neoplasm of digestive organs: Secondary | ICD-10-CM

## 2013-10-15 DIAGNOSIS — R634 Abnormal weight loss: Secondary | ICD-10-CM

## 2013-10-15 DIAGNOSIS — K0530 Chronic periodontitis, unspecified: Secondary | ICD-10-CM

## 2013-10-15 DIAGNOSIS — K069 Disorder of gingiva and edentulous alveolar ridge, unspecified: Secondary | ICD-10-CM

## 2013-10-15 DIAGNOSIS — M23305 Other meniscus derangements, unspecified medial meniscus, unspecified knee: Secondary | ICD-10-CM

## 2013-10-15 DIAGNOSIS — M5430 Sciatica, unspecified side: Secondary | ICD-10-CM

## 2013-10-15 DIAGNOSIS — F17200 Nicotine dependence, unspecified, uncomplicated: Secondary | ICD-10-CM | POA: Insufficient documentation

## 2013-10-15 DIAGNOSIS — B182 Chronic viral hepatitis C: Secondary | ICD-10-CM

## 2013-10-15 DIAGNOSIS — R194 Change in bowel habit: Secondary | ICD-10-CM

## 2013-10-15 DIAGNOSIS — J45909 Unspecified asthma, uncomplicated: Secondary | ICD-10-CM | POA: Insufficient documentation

## 2013-10-15 DIAGNOSIS — M5410 Radiculopathy, site unspecified: Secondary | ICD-10-CM

## 2013-10-15 DIAGNOSIS — Z9889 Other specified postprocedural states: Secondary | ICD-10-CM

## 2013-10-15 DIAGNOSIS — M25669 Stiffness of unspecified knee, not elsewhere classified: Secondary | ICD-10-CM

## 2013-10-15 DIAGNOSIS — K219 Gastro-esophageal reflux disease without esophagitis: Secondary | ICD-10-CM | POA: Insufficient documentation

## 2013-10-15 DIAGNOSIS — M549 Dorsalgia, unspecified: Secondary | ICD-10-CM

## 2013-10-15 DIAGNOSIS — S82109A Unspecified fracture of upper end of unspecified tibia, initial encounter for closed fracture: Secondary | ICD-10-CM

## 2013-10-15 DIAGNOSIS — K029 Dental caries, unspecified: Secondary | ICD-10-CM

## 2013-10-15 HISTORY — PX: MULTIPLE EXTRACTIONS WITH ALVEOLOPLASTY: SHX5342

## 2013-10-15 SURGERY — MULTIPLE EXTRACTION WITH ALVEOLOPLASTY
Anesthesia: General | Site: Mouth

## 2013-10-15 MED ORDER — SODIUM CHLORIDE 0.9 % IR SOLN
Status: DC | PRN
  Administered 2013-10-15: 1000 mL

## 2013-10-15 MED ORDER — SODIUM CHLORIDE 0.9 % IJ SOLN
INTRAMUSCULAR | Status: AC
  Filled 2013-10-15: qty 10

## 2013-10-15 MED ORDER — ROCURONIUM BROMIDE 50 MG/5ML IV SOLN
INTRAVENOUS | Status: AC
  Filled 2013-10-15: qty 1

## 2013-10-15 MED ORDER — PROPOFOL 10 MG/ML IV BOLUS
INTRAVENOUS | Status: AC
  Filled 2013-10-15: qty 20

## 2013-10-15 MED ORDER — FENTANYL CITRATE 0.05 MG/ML IJ SOLN
INTRAMUSCULAR | Status: AC
  Filled 2013-10-15: qty 5

## 2013-10-15 MED ORDER — 0.9 % SODIUM CHLORIDE (POUR BTL) OPTIME
TOPICAL | Status: DC | PRN
  Administered 2013-10-15: 1000 mL

## 2013-10-15 MED ORDER — FENTANYL CITRATE 0.05 MG/ML IJ SOLN
INTRAMUSCULAR | Status: DC | PRN
  Administered 2013-10-15: 50 ug via INTRAVENOUS
  Administered 2013-10-15: 100 ug via INTRAVENOUS

## 2013-10-15 MED ORDER — OXYCODONE-ACETAMINOPHEN 10-325 MG PO TABS: 1.0000 | ORAL_TABLET | ORAL | Status: DC | PRN

## 2013-10-15 MED ORDER — OXYMETAZOLINE HCL 0.05 % NA SOLN
NASAL | Status: AC
  Filled 2013-10-15: qty 15

## 2013-10-15 MED ORDER — ONDANSETRON HCL 4 MG/2ML IJ SOLN
INTRAMUSCULAR | Status: DC | PRN
  Administered 2013-10-15: 4 mg via INTRAVENOUS

## 2013-10-15 MED ORDER — LIDOCAINE-EPINEPHRINE 2 %-1:100000 IJ SOLN
INTRAMUSCULAR | Status: DC | PRN
  Administered 2013-10-15: 15 mL

## 2013-10-15 MED ORDER — ONDANSETRON HCL 4 MG/2ML IJ SOLN
INTRAMUSCULAR | Status: AC
  Filled 2013-10-15: qty 2

## 2013-10-15 MED ORDER — LIDOCAINE-EPINEPHRINE 2 %-1:100000 IJ SOLN
INTRAMUSCULAR | Status: AC
  Filled 2013-10-15: qty 1

## 2013-10-15 MED ORDER — LIDOCAINE HCL (CARDIAC) 20 MG/ML IV SOLN
INTRAVENOUS | Status: AC
  Filled 2013-10-15: qty 5

## 2013-10-15 MED ORDER — ONDANSETRON HCL 4 MG/2ML IJ SOLN: 4.0000 mg | Freq: Once | INTRAMUSCULAR | Status: DC | PRN

## 2013-10-15 MED ORDER — GLYCOPYRROLATE 0.2 MG/ML IJ SOLN
INTRAMUSCULAR | Status: AC
  Filled 2013-10-15: qty 3

## 2013-10-15 MED ORDER — PROPOFOL 10 MG/ML IV BOLUS
INTRAVENOUS | Status: DC | PRN
  Administered 2013-10-15: 180 mg via INTRAVENOUS

## 2013-10-15 MED ORDER — MIDAZOLAM HCL 5 MG/5ML IJ SOLN
INTRAMUSCULAR | Status: DC | PRN
  Administered 2013-10-15: 2 mg via INTRAVENOUS

## 2013-10-15 MED ORDER — SUCCINYLCHOLINE CHLORIDE 20 MG/ML IJ SOLN
INTRAMUSCULAR | Status: AC
  Filled 2013-10-15: qty 1

## 2013-10-15 MED ORDER — LACTATED RINGERS IV SOLN
INTRAVENOUS | Status: DC
  Administered 2013-10-15: 09:00:00 via INTRAVENOUS

## 2013-10-15 MED ORDER — HYDROMORPHONE HCL PF 1 MG/ML IJ SOLN
INTRAMUSCULAR | Status: AC
  Filled 2013-10-15: qty 1

## 2013-10-15 MED ORDER — OXYCODONE-ACETAMINOPHEN 5-325 MG PO TABS
2.0000 | ORAL_TABLET | Freq: Once | ORAL | Status: AC
  Administered 2013-10-15: 2 via ORAL

## 2013-10-15 MED ORDER — HYDROMORPHONE HCL PF 1 MG/ML IJ SOLN
0.2500 mg | INTRAMUSCULAR | Status: DC | PRN
  Administered 2013-10-15: 0.5 mg via INTRAVENOUS

## 2013-10-15 MED ORDER — LIDOCAINE HCL (CARDIAC) 20 MG/ML IV SOLN
INTRAVENOUS | Status: DC | PRN
  Administered 2013-10-15: 50 mg via INTRAVENOUS

## 2013-10-15 MED ORDER — NEOSTIGMINE METHYLSULFATE 1 MG/ML IJ SOLN
INTRAMUSCULAR | Status: AC
  Filled 2013-10-15: qty 10

## 2013-10-15 MED ORDER — OXYCODONE-ACETAMINOPHEN 5-325 MG PO TABS
ORAL_TABLET | ORAL | Status: AC
  Filled 2013-10-15: qty 2

## 2013-10-15 MED ORDER — EPHEDRINE SULFATE 50 MG/ML IJ SOLN
INTRAMUSCULAR | Status: AC
  Filled 2013-10-15: qty 1

## 2013-10-15 MED ORDER — CLINDAMYCIN PHOSPHATE 900 MG/50ML IV SOLN
INTRAVENOUS | Status: DC | PRN
  Administered 2013-10-15: 900 mg via INTRAVENOUS

## 2013-10-15 MED ORDER — MIDAZOLAM HCL 2 MG/2ML IJ SOLN
INTRAMUSCULAR | Status: AC
  Filled 2013-10-15: qty 2

## 2013-10-15 MED ORDER — SUCCINYLCHOLINE CHLORIDE 20 MG/ML IJ SOLN
INTRAMUSCULAR | Status: DC | PRN
  Administered 2013-10-15: 100 mg via INTRAVENOUS

## 2013-10-15 SURGICAL SUPPLY — 29 items
BUR CROSS CUT FISSURE 1.6 (BURR) ×2 IMPLANT
BUR CROSS CUT FISSURE 1.6MM (BURR) ×1
BUR EGG ELITE 4.0 (BURR) ×2 IMPLANT
BUR EGG ELITE 4.0MM (BURR) ×1
CANISTER SUCTION 2500CC (MISCELLANEOUS) ×3 IMPLANT
COVER SURGICAL LIGHT HANDLE (MISCELLANEOUS) ×3 IMPLANT
CRADLE DONUT ADULT HEAD (MISCELLANEOUS) ×3 IMPLANT
GAUZE PACKING FOLDED 2  STR (GAUZE/BANDAGES/DRESSINGS) ×2
GAUZE PACKING FOLDED 2 STR (GAUZE/BANDAGES/DRESSINGS) ×1 IMPLANT
GLOVE BIO SURGEON STRL SZ 6.5 (GLOVE) ×2 IMPLANT
GLOVE BIO SURGEON STRL SZ7.5 (GLOVE) ×3 IMPLANT
GLOVE BIO SURGEONS STRL SZ 6.5 (GLOVE) ×1
GLOVE BIOGEL PI IND STRL 7.0 (GLOVE) ×1 IMPLANT
GLOVE BIOGEL PI INDICATOR 7.0 (GLOVE) ×2
GOWN STRL REUS W/ TWL LRG LVL3 (GOWN DISPOSABLE) ×2 IMPLANT
GOWN STRL REUS W/ TWL XL LVL3 (GOWN DISPOSABLE) ×1 IMPLANT
GOWN STRL REUS W/TWL LRG LVL3 (GOWN DISPOSABLE) ×4
GOWN STRL REUS W/TWL XL LVL3 (GOWN DISPOSABLE) ×2
KIT BASIN OR (CUSTOM PROCEDURE TRAY) ×3 IMPLANT
KIT ROOM TURNOVER OR (KITS) ×3 IMPLANT
NEEDLE 22X1 1/2 (OR ONLY) (NEEDLE) ×3 IMPLANT
NS IRRIG 1000ML POUR BTL (IV SOLUTION) ×3 IMPLANT
PAD ARMBOARD 7.5X6 YLW CONV (MISCELLANEOUS) ×6 IMPLANT
SUT CHROMIC 3 0 PS 2 (SUTURE) ×6 IMPLANT
SYR CONTROL 10ML LL (SYRINGE) ×3 IMPLANT
TOWEL OR 17X26 10 PK STRL BLUE (TOWEL DISPOSABLE) ×3 IMPLANT
TRAY ENT MC OR (CUSTOM PROCEDURE TRAY) ×3 IMPLANT
TUBING IRRIGATION (MISCELLANEOUS) ×3 IMPLANT
YANKAUER SUCT BULB TIP NO VENT (SUCTIONS) ×3 IMPLANT

## 2013-10-15 NOTE — Progress Notes (Signed)
cc'd to pcp 

## 2013-10-15 NOTE — Anesthesia Preprocedure Evaluation (Addendum)
Anesthesia Evaluation  Patient identified by MRN, date of birth, ID band Patient awake    Reviewed: Allergy & Precautions, H&P , NPO status , [name] Chart, lab work & pertinent test results  History of Anesthesia Complications (+) PONV  Airway Mallampati: II TM Distance: >3 FB Neck ROM: Full    Dental  (+) Poor Dentition, Dental Advisory Given   Pulmonary Current Smoker,          Cardiovascular hypertension,     Neuro/Psych  Neuromuscular disease    GI/Hepatic GERD-  ,(+) Hepatitis -, C  Endo/Other    Renal/GU      Musculoskeletal   Abdominal   Peds  Hematology   Anesthesia Other Findings   Reproductive/Obstetrics                          Anesthesia Physical Anesthesia Plan  ASA: III  Anesthesia Plan: General   Post-op Pain Management:    Induction: Intravenous  Airway Management Planned: Nasal ETT  Additional Equipment:   Intra-op Plan:   Post-operative Plan: Extubation in OR  Informed Consent: I have reviewed the patients History and Physical, chart, labs and discussed the procedure including the risks, benefits and alternatives for the proposed anesthesia with the patient or authorized representative who has indicated his/her understanding and acceptance.   Dental advisory given  Plan Discussed with: CRNA, Anesthesiologist and Surgeon  Anesthesia Plan Comments:        Anesthesia Quick Evaluation

## 2013-10-15 NOTE — Discharge Instructions (Signed)
What to Eat after Tooth extraction:   ° ° °For your first meals, you should eat lightly; only small meals at first.   Avoid Sharp, Crunchy, and Hot foods.   If you do not have nausea, you may eat larger meals.  Avoid spicy, greasy and heavy food, as these may make you sick after the anesthesia.  ° ° °General Anesthesia, Adult, Care After  °Refer to this sheet in the next few weeks. These instructions provide you with information on caring for yourself after your procedure. Your health care provider may also give you more specific instructions. Your treatment has been planned according to current medical practices, but problems sometimes occur. Call your health care provider if you have any problems or questions after your procedure.  °WHAT TO EXPECT AFTER THE PROCEDURE  °After the procedure, it is typical to experience:  °Sleepiness.  °Nausea and vomiting. °HOME CARE INSTRUCTIONS  °For the first 24 hours after general anesthesia:  °Have a responsible person with you.  °Do not drive a car. If you are alone, do not take public transportation.  °Do not drink alcohol.  °Do not take medicine that has not been prescribed by your health care provider.  °Do not sign important papers or make important decisions.  °You may resume a normal diet and activities as directed by your health care provider.  °Change bandages (dressings) as directed.  °If you have questions or problems that seem related to general anesthesia, call the hospital and ask for the anesthetist or anesthesiologist on call. °SEEK MEDICAL CARE IF:  °You have nausea and vomiting that continue the day after anesthesia.  °You develop a rash. °SEEK IMMEDIATE MEDICAL CARE IF:  °You have difficulty breathing.  °You have chest pain.  °You have any allergic problems. °Document Released: 10/25/2000 Document Revised: 03/21/2013 Document Reviewed: 02/01/2013  °ExitCare® Patient Information ©2014 [employer].  ° ° °

## 2013-10-15 NOTE — Op Note (Signed)
10/15/2013  9:48 AM  PATIENT:  Colleen Lee  58 y.o. female  PRE-OPERATIVE DIAGNOSIS:  NON RESTORABLE TEETH #'s 1, 5, 7, 8, 9, 10, 12, 20, 21, 23, 26, 31  POST-OPERATIVE DIAGNOSIS:  SAME  PROCEDURE:  Procedure(s): MULTIPLE EXTRACTION TEETH #'s 1, 5, 7, 8, 9, 10, 12, 20, 21, 23, 26, 31 WITH ALVEOLOPLASTY  SURGEON:  Surgeon(s): Gae Bon, DDS  ANESTHESIA:   local and general  EBL:  minimal  DRAINS: none   SPECIMEN:  No Specimen  COUNTS:  YES  PLAN OF CARE: Discharge to home after PACU  PATIENT DISPOSITION:  PACU - hemodynamically stable.   PROCEDURE DETAILS: Dictation #[id number]  Gae Bon, DMD 10/15/2013 9:48 AM

## 2013-10-15 NOTE — Anesthesia Procedure Notes (Signed)
Procedure Name: Intubation Date/Time: 10/15/2013 9:26 AM Performed by: Maude Leriche D Pre-anesthesia Checklist: Patient identified, Emergency Drugs available, Suction available, Patient being monitored and Timeout performed Patient Re-evaluated:Patient Re-evaluated prior to inductionOxygen Delivery Method: Circle system utilized Preoxygenation: Pre-oxygenation with 100% oxygen Intubation Type: IV induction Ventilation: Mask ventilation without difficulty Laryngoscope Size: Mac and 3 Grade View: Grade I Nasal Tubes: Right, Nasal Rae, Magill forceps- large, utilized and Nasal prep performed Tube size: 7.0 mm Number of attempts: 2 (Attempt by CRNA with grade 1 view but unable to place ETT. DL x 1 by MDA with successul nasalintubation) Placement Confirmation: ETT inserted through vocal cords under direct vision,  positive ETCO2 and breath sounds checked- equal and bilateral Tube secured with: Tape (Left upper lip with small laceration) Dental Injury: Teeth and Oropharynx as per pre-operative assessment

## 2013-10-15 NOTE — H&P (Signed)
H&P documentation  -History and Physical Reviewed  -Patient has been re-examined  -No change in the plan of care  Colleen Lee  

## 2013-10-15 NOTE — Transfer of Care (Signed)
Immediate Anesthesia Transfer of Care Note  Patient: Colleen Lee  Procedure(s) Performed: Procedure(s): MULTIPLE EXTRACTION WITH ALVEOLOPLASTY (N/A)  Patient Location: PACU  Anesthesia Type:General  Level of Consciousness: awake  Airway & Oxygen Therapy: Patient Spontanous Breathing and Patient connected to nasal cannula oxygen  Post-op Assessment: Report given to PACU RN and Post -op Vital signs reviewed and stable  Post vital signs: Reviewed and stable  Complications: No apparent anesthesia complications

## 2013-10-15 NOTE — Progress Notes (Signed)
Spoke with Dr. Tamala Julian RE patients elevated BP.  Ordered to tell patient to take BP medication when she arrives at home, OK to discharge.

## 2013-10-15 NOTE — Anesthesia Postprocedure Evaluation (Signed)
  Anesthesia Post-op Note  Patient: Colleen Lee  Procedure(s) Performed: Procedure(s): MULTIPLE EXTRACTION WITH ALVEOLOPLASTY (N/A)  Patient Location: PACU  Anesthesia Type:General  Level of Consciousness: awake, alert , oriented and patient cooperative  Airway and Oxygen Therapy: Patient Spontanous Breathing  Post-op Pain: none  Post-op Assessment: Post-op Vital signs reviewed, [name] Cardiovascular Status Stable, Respiratory Function Stable, Patent Airway, No signs of Nausea or vomiting and Pain level controlled  Post-op Vital Signs: stable  Complications: No apparent anesthesia complications

## 2013-10-15 NOTE — Op Note (Signed)
[name], Colleen Lee                ACCOUNT NO.:  [id number]  MEDICAL RECORD NO.:  [id number]  LOCATION:  [facility]                         FACILITY:  [facility]  PHYSICIAN:  Gae Bon, M.D.  DATE OF BIRTH:  September 11, 1955  DATE OF PROCEDURE:  10/15/2013 DATE OF DISCHARGE:  10/15/2013                              OPERATIVE REPORT   PREOPERATIVE DIAGNOSIS:  Nonrestorable teeth secondary to dental caries and periodontal disease #1, 5, 7, 8, 9, 10, 12, 20, 21, 23, 26, 31.  POSTOPERATIVE DIAGNOSIS:  Nonrestorable teeth secondary to dental caries and periodontal disease #1, 5, 7, 8, 9, 10, 12, 20, 21, 23, 26, 31.  PROCEDURE:  Extraction of teeth #1, 5, 7, 8, 9, 10, 12, 20, 21, 23, 26, 27.  Alveoplasty, right and left maxilla.  SURGEON:  Gae Bon, M.D.  ANESTHESIA:  General nasal.  ATTENDING:  Dr. Tamala Julian.  PROCEDURE:  The patient was taken to the operating room, placed on the table in supine position.  General anesthesia was administered intravenously and a nasal endotracheal tube was placed and secured.  The eyes were protected.  The patient was draped for the procedure.  Time- out was performed.  Then, the posterior pharynx was suctioned and a throat pack was placed.  A 2% lidocaine with 1:100,000 epinephrine was infiltrated in inferior alveolar block on the right and left sides and buccal and palatal infiltration in the maxilla adjacent to the teeth to be extracted.  A bite block was placed in the right side of the mouth and a sweetheart retractor was used to retract the tongue.  A 15 blade was used to make an incision around teeth numbers 20, 21, and in the mandible on the left side and teeth #7, 8, 9, 10, and 12 in the maxilla on the buccal and palatal aspects.  The periosteum was reflected and then the teeth were elevated with a 3 and 1 elevator.  The lower teeth were removed with the Asch forceps and then the upper teeth were removed with the universal #150 forceps.  The  sockets were then curetted and then the periosteum was reflected in the mandible and maxilla on the left side to expose the alveolar ridge.  The bone file and egg-shaped bur were used to perform alveoplasty.  Then, these areas were irrigated and closed with 3-0 chromic.  Then, the bite block and sweetheart retractor were repositioned to the other side of the mouth, and a 15 blade was used to make an incision around teeth numbers 1, 5, 12, and 31.  The periosteum was reflected and then the teeth were elevated and removed with a dental forceps.  The sockets were curetted and irrigated. Then, the alveolar ridge was exposed and alveoplasty was performed in the maxilla using the egg-shaped bur and bone file.  Then, the area was closed with 3-0 chromic.  The oral cavity was then irrigated and suctioned and inspected, found to have good contour hemostasis and closure.  Throat pack was removed.  The patient was awakened and taken to the recovery room, breathing spontaneously in good condition.  ESTIMATED BLOOD LOSS:  Minimal.  COMPLICATIONS:  None.  SPECIMENS:  None.  Gae Bon, M.D.     SMJ/MEDQ  D:  10/15/2013  T:  10/15/2013  Job:  [id number]

## 2013-10-16 ENCOUNTER — Encounter (HOSPITAL_COMMUNITY): Payer: Self-pay | Admitting: Oral Surgery

## 2013-10-25 ENCOUNTER — Ambulatory Visit (INDEPENDENT_AMBULATORY_CARE_PROVIDER_SITE_OTHER): Payer: Medicare Other | Admitting: Orthopedic Surgery

## 2013-10-25 VITALS — BP 140/82 | Ht 66.0 in | Wt 150.0 lb

## 2013-10-25 DIAGNOSIS — M549 Dorsalgia, unspecified: Secondary | ICD-10-CM

## 2013-10-25 DIAGNOSIS — IMO0002 Reserved for concepts with insufficient information to code with codable children: Secondary | ICD-10-CM

## 2013-10-25 DIAGNOSIS — M5410 Radiculopathy, site unspecified: Secondary | ICD-10-CM

## 2013-10-25 MED ORDER — HYDROCODONE-ACETAMINOPHEN 5-325 MG PO TABS: 1.0000 | ORAL_TABLET | ORAL | Status: DC | PRN

## 2013-10-25 MED ORDER — METHOCARBAMOL 500 MG PO TABS: 500.0000 mg | ORAL_TABLET | Freq: Three times a day (TID) | ORAL | Status: DC

## 2013-10-25 NOTE — Progress Notes (Signed)
Patient ID: Colleen Lee, female   DOB: 07-Feb-1956, 58 y.o.   MRN: [id number]  Chief Complaint  Patient presents with  . Follow-up    2 month recheck on back and left leg.   History chronic back pain probable surgical lesion lumbar spine. Complains of increased back pain and increased burning  Review of systems recently had dental work done complains of some oral pain at this time  Established problem worse prescribed and refill medications  Meds ordered this encounter  Medications  . methocarbamol (ROBAXIN) 500 MG tablet    Sig: Take 1 tablet (500 mg total) by mouth 3 (three) times daily.    Dispense:  60 tablet    Refill:  5  . HYDROcodone-acetaminophen (NORCO/VICODIN) 5-325 MG per tablet    Sig: Take 1 tablet by mouth every 4 (four) hours as needed for moderate pain.    Dispense:  180 tablet    Refill:  0

## 2013-10-29 ENCOUNTER — Telehealth: Payer: Self-pay

## 2013-10-29 MED ORDER — LUBIPROSTONE 24 MCG PO CAPS: 24.0000 ug | ORAL_CAPSULE | Freq: Two times a day (BID) | ORAL | Status: DC

## 2013-10-29 NOTE — Telephone Encounter (Signed)
Tried to do a PA for Linzess 232mcg for this pt. It was denied because pt hasnt tried and failed Amitiza.

## 2013-10-29 NOTE — Telephone Encounter (Signed)
rx for [employer] [facility]

## 2013-10-29 NOTE — Telephone Encounter (Signed)
Tried to call pt- LM with female to return call. 

## 2013-10-30 NOTE — Telephone Encounter (Signed)
Pt is aware. She will try Amitiza and let me know if it doesn't work for her.

## 2013-11-13 ENCOUNTER — Encounter: Payer: Self-pay | Admitting: *Deleted

## 2013-12-05 ENCOUNTER — Encounter (HOSPITAL_COMMUNITY): Payer: Self-pay | Admitting: Emergency Medicine

## 2013-12-05 ENCOUNTER — Emergency Department (HOSPITAL_COMMUNITY)
Admission: EM | Admit: 2013-12-05 | Discharge: 2013-12-05 | Disposition: A | Discharge status: Home / Self Care | Payer: Medicare PPO | Attending: Emergency Medicine | Admitting: Emergency Medicine

## 2013-12-05 DIAGNOSIS — Z9889 Other specified postprocedural states: Secondary | ICD-10-CM | POA: Insufficient documentation

## 2013-12-05 DIAGNOSIS — M7989 Other specified soft tissue disorders: Secondary | ICD-10-CM

## 2013-12-05 DIAGNOSIS — Z8719 Personal history of other diseases of the digestive system: Secondary | ICD-10-CM | POA: Diagnosis not present

## 2013-12-05 DIAGNOSIS — Z79899 Other long term (current) drug therapy: Secondary | ICD-10-CM | POA: Diagnosis not present

## 2013-12-05 DIAGNOSIS — Z8619 Personal history of other infectious and parasitic diseases: Secondary | ICD-10-CM | POA: Insufficient documentation

## 2013-12-05 DIAGNOSIS — I10 Essential (primary) hypertension: Secondary | ICD-10-CM | POA: Diagnosis not present

## 2013-12-05 DIAGNOSIS — Z880 Allergy status to penicillin: Secondary | ICD-10-CM | POA: Diagnosis not present

## 2013-12-05 DIAGNOSIS — F17200 Nicotine dependence, unspecified, uncomplicated: Secondary | ICD-10-CM | POA: Insufficient documentation

## 2013-12-05 DIAGNOSIS — J45909 Unspecified asthma, uncomplicated: Secondary | ICD-10-CM | POA: Insufficient documentation

## 2013-12-05 LAB — CBC WITH DIFFERENTIAL/PLATELET
Basophils Absolute: 0 10*3/uL (ref 0.0–0.1)
Basophils Relative: 0 % (ref 0–1)
Eosinophils Absolute: 0.3 10*3/uL (ref 0.0–0.7)
Eosinophils Relative: 5 % (ref 0–5)
HCT: 35.6 % — ABNORMAL LOW (ref 36.0–46.0)
Hemoglobin: 12.3 g/dL (ref 12.0–15.0)
Lymphocytes Relative: 26 % (ref 12–46)
Lymphs Abs: 1.5 10*3/uL (ref 0.7–4.0)
MCH: 31.5 pg (ref 26.0–34.0)
MCHC: 34.6 g/dL (ref 30.0–36.0)
MCV: 91 fL (ref 78.0–100.0)
Monocytes Absolute: 0.8 10*3/uL (ref 0.1–1.0)
Monocytes Relative: 13 % — ABNORMAL HIGH (ref 3–12)
Neutro Abs: 3.3 10*3/uL (ref 1.7–7.7)
Neutrophils Relative %: 56 % (ref 43–77)
Platelets: 153 10*3/uL (ref 150–400)
RBC: 3.91 MIL/uL (ref 3.87–5.11)
RDW: 13.7 % (ref 11.5–15.5)
WBC: 5.9 10*3/uL (ref 4.0–10.5)

## 2013-12-05 LAB — COMPREHENSIVE METABOLIC PANEL
ALT: 25 U/L (ref 0–35)
AST: 33 U/L (ref 0–37)
Albumin: 3.5 g/dL (ref 3.5–5.2)
Alkaline Phosphatase: 97 U/L (ref 39–117)
BUN: 5 mg/dL — ABNORMAL LOW (ref 6–23)
CO2: 25 mEq/L (ref 19–32)
Calcium: 10.4 mg/dL (ref 8.4–10.5)
Chloride: 102 mEq/L (ref 96–112)
Creatinine, Ser: 0.7 mg/dL (ref 0.50–1.10)
GFR calc Af Amer: >90 mL/min (ref >90)
GFR calc non Af Amer: >90 mL/min (ref >90)
Glucose, Bld: 98 mg/dL (ref 70–99)
Potassium: 3.4 mEq/L — ABNORMAL LOW (ref 3.7–5.3)
Sodium: 139 mEq/L (ref 137–147)
Total Bilirubin: 0.6 mg/dL (ref 0.3–1.2)
Total Protein: 7.8 g/dL (ref 6.0–8.3)

## 2013-12-05 MED ORDER — OXYCODONE-ACETAMINOPHEN 5-325 MG PO TABS: 1.0000 | ORAL_TABLET | ORAL | Status: DC | PRN

## 2013-12-05 NOTE — Discharge Instructions (Signed)

## 2013-12-05 NOTE — ED Notes (Signed)
Pt c/o bilateral foot swelling x2 days. Pt denies injury. Pt denies SOB.

## 2013-12-06 ENCOUNTER — Telehealth: Payer: Self-pay

## 2013-12-06 MED ORDER — LINACLOTIDE 290 MCG PO CAPS: 290.0000 ug | ORAL_CAPSULE | Freq: Every day | ORAL | Status: DC

## 2013-12-06 NOTE — Telephone Encounter (Signed)
She is calling to let us know she had to go to the ER because the Amitiza is making her swell. The ER told her to stop the medication. She is wanting to know if there is anything different she could take.

## 2013-12-06 NOTE — Telephone Encounter (Signed)
Give samples of Linzess 290. She was on this before but insurance denied because she hadn't failed Amitiza. Well, it has been failed for sure now. I will send in [place] again.

## 2013-12-07 NOTE — Telephone Encounter (Signed)
Tried to call with no answer  

## 2013-12-07 NOTE — Telephone Encounter (Signed)
Pt will come by to pick up samples 

## 2013-12-08 NOTE — ED Provider Notes (Signed)
CSN: [id number]     Arrival date & time 12/05/13  1635 History   First MD Initiated Contact with Patient 12/05/13 1654     Chief Complaint  Patient presents with  . Foot Swelling     (Consider location/radiation/quality/duration/timing/severity/associated sxs/prior Treatment) Patient is a 58 y.o. female presenting with leg pain. The history is provided by the patient.  Leg Pain Location:  Foot Time since incident:  2 days Injury: no   Foot location:  L foot, R foot, dorsum of L foot and dorsum of R foot Pain details:    Quality:  Aching   Radiates to:  Does not radiate   Severity:  Mild   Onset quality:  Gradual   Timing:  Intermittent   Progression:  Waxing and waning Chronicity:  Recurrent Dislocation: no   Foreign body present:  No foreign bodies Prior injury to area:  No Relieved by:  Elevation Worsened by:  Bearing weight (standing) Ineffective treatments:  None tried Associated symptoms: swelling   Associated symptoms: no back pain, no decreased ROM, no fever, no neck pain, no numbness, no stiffness and no tingling    Patient reports intermittent swelling of the bilateral feet for 2 days.  Patient states the swelling will improve after rest, but returns with weight bearing and standing.  She denies known injury, redness, chest pain, shortness of breath, calf pain or swelling.    Past Medical History  Diagnosis Date  . Hypertension   . Fibroids   . [name]     HCV RNA positive 09/2012  . GERD (gastroesophageal reflux disease)   . H. pylori infection 2014    treated with pylera  . Asthma   . PONV (postoperative nausea and vomiting)     pt thinks maybe once she had N&V   Past Surgical History  Procedure Laterality Date  . Knee surgery      left knee  . Colonoscopy  01/18/2008    VOZ:DGUYQI rectum.  Long redundant colon, a diminutive sigmoid polyp status post cold biopsy removed. Hyperplastic polyp. Repeat colonoscopy June 2014 due to family history of colon  cancer  . Esophagogastroduodenoscopy  01/18/2008    RMR: Normal esophagus, normal  stomach  . Colonoscopy with esophagogastroduodenoscopy (egd) N/A 11/02/2012    HKV:[id number] gastric mucosa of doubtful, +H.pylori. Incomplete colonoscopy due to patient unable to tolerate exam, proximal colon seen. Patient refused ACBE.  . Colonoscopy with propofol N/A 03/08/2013    Dr. Gala Romney: colonic polyp-removed as scribed above. Internal Hemorrhoids. Pathology did not reveal any colonic tissue, only mucus. SURVEILLANCE DUE Aug 2019  . Polypectomy N/A 03/08/2013    Procedure: POLYPECTOMY;  Surgeon: Daneil Dolin, MD;  Location: [facility];  Service: Endoscopy;  Laterality: N/A;  . Multiple extractions with alveoloplasty N/A 10/15/2013    Procedure: MULTIPLE EXTRACTION WITH ALVEOLOPLASTY;  Surgeon: Gae Bon, DDS;  Location: [facility];  Service: Oral Surgery;  Laterality: N/A;   Family History  Problem Relation Age of Onset  . Colon cancer Brother        . Asthma      FH  . Multiple myeloma Brother   . Liver cancer Sister   . Prostate cancer Brother   . Pancreatic cancer Brother   . Cancer Other     niece, age 58, primary unknown   History  Substance Use Topics  . Smoking status: Current Every Day Smoker -- 1.00 packs/day for 40 years    Types: Cigarettes  . Smokeless tobacco:  Not on file  . Alcohol Use: No     Comment: former, last Jan 2014   OB History   Grav Para Term Preterm Abortions TAB SAB Ect Mult Living                 Review of Systems  Constitutional: Negative for fever and chills.  Genitourinary: Negative for dysuria and difficulty urinating.  Musculoskeletal: Negative for back pain, joint swelling, neck pain and stiffness.       Swelling of bilateral feet  Skin: Negative for color change and wound.  All other systems reviewed and are negative.     Allergies  Penicillins  Home Medications   Prior to Admission medications   Medication Sig Start Date End Date Taking?  Authorizing Provider  Cyanocobalamin (VITAMIN B 12 PO) Take 1 tablet by mouth daily.   Yes Historical Provider, MD  lisinopril (PRINIVIL,ZESTRIL) 10 MG tablet Take 10 mg by mouth daily.   Yes Historical Provider, MD  lubiprostone (AMITIZA) 24 MCG capsule Take 1 capsule (24 mcg total) by mouth 2 (two) times daily with a meal. 10/29/13  Yes Mahala Menghini, PA-C  megestrol (MEGACE) 40 MG/ML suspension Take 200 mg by mouth daily.   Yes Historical Provider, MD  methocarbamol (ROBAXIN) 500 MG tablet Take 1 tablet (500 mg total) by mouth 3 (three) times daily. 10/25/13  Yes Carole Civil, MD  potassium chloride SA (K-DUR,KLOR-CON) 20 MEQ tablet Take 20 mEq by mouth daily.   Yes Historical Provider, MD  Linaclotide (LINZESS) 290 MCG CAPS capsule Take 1 capsule (290 mcg total) by mouth daily. 12/06/13   Orvil Feil, NP  ondansetron (ZOFRAN ODT) 8 MG disintegrating tablet Take 1 tablet (8 mg total) by mouth every 8 (eight) hours as needed for nausea. 05/01/13   Carole Civil, MD  oxyCODONE-acetaminophen (PERCOCET/ROXICET) 5-325 MG per tablet Take 1 tablet by mouth every 4 (four) hours as needed for severe pain. 12/05/13   Burgandy Hackworth L. Chellsea Beckers, PA-C   BP 137/83  Pulse 71  Temp(Src) 98.2 F (36.8 C) (Oral)  Resp 18  Ht 5' 6" (1.676 m)  Wt 145 lb (65.772 kg)  BMI 23.41 kg/m2 Physical Exam  Nursing note and vitals reviewed. Constitutional: She is oriented to person, place, and time. She appears well-developed and well-nourished. No distress.  HENT:  Head: Normocephalic and atraumatic.  Cardiovascular: Normal rate, regular rhythm, normal heart sounds and intact distal pulses.   Pulmonary/Chest: Effort normal and breath sounds normal.  Musculoskeletal: She exhibits edema and tenderness.  Mild STS of the dorsal , bilateral feet. No pitting edema. ROM is preserved.  DP pulse is brisk,distal sensation intact.  No erythema, abrasion, bruising or bony deformity.  No proximal tenderness, erythema or edema   Neurological: She is alert and oriented to person, place, and time. She exhibits normal muscle tone. Coordination normal.  Skin: Skin is warm and dry.    ED Course  Procedures (including critical care time) Labs Review Labs Reviewed  CBC WITH DIFFERENTIAL - Abnormal; Notable for the following:    HCT 35.6 (*)    Monocytes Relative 13 (*)    All other components within normal limits  COMPREHENSIVE METABOLIC PANEL - Abnormal; Notable for the following:    Potassium 3.4 (*)    BUN 5 (*)    All other components within normal limits    Imaging Review No results found.   EKG Interpretation None      MDM   Final diagnoses:  Bilateral swelling of feet    Intermittent STS of the bilateral feet.  No calf pain, erythema or edema.  No  concerning sx's for cellulitis,  PE or DVT.  Pt agrees to symptomatic tx and close f/u with her PMD.  She was also advised to return for any worsening sx's.  She agrees to plan and appears stable for d/c    Weston Fulco L. Abbee Cremeens, PA-C 12/08/13 1310

## 2013-12-10 NOTE — ED Provider Notes (Signed)
Medical screening examination/treatment/procedure(s) were performed by non-physician practitioner and as supervising physician I was immediately available for consultation/collaboration.   EKG Interpretation None        Mervin Kung, MD 12/10/13 [phone]

## 2013-12-11 ENCOUNTER — Ambulatory Visit: Payer: Medicare Other | Admitting: Gastroenterology

## 2013-12-25 ENCOUNTER — Ambulatory Visit (INDEPENDENT_AMBULATORY_CARE_PROVIDER_SITE_OTHER): Payer: Medicare HMO | Admitting: Orthopedic Surgery

## 2013-12-25 VITALS — BP 146/91 | Ht 66.0 in | Wt 145.0 lb

## 2013-12-25 DIAGNOSIS — M654 Radial styloid tenosynovitis [de Quervain]: Secondary | ICD-10-CM | POA: Insufficient documentation

## 2013-12-25 DIAGNOSIS — M5410 Radiculopathy, site unspecified: Secondary | ICD-10-CM

## 2013-12-25 DIAGNOSIS — IMO0002 Reserved for concepts with insufficient information to code with codable children: Secondary | ICD-10-CM

## 2013-12-25 DIAGNOSIS — M549 Dorsalgia, unspecified: Secondary | ICD-10-CM

## 2013-12-25 MED ORDER — HYDROCODONE-ACETAMINOPHEN 5-325 MG PO TABS: 1.0000 | ORAL_TABLET | ORAL | Status: DC | PRN

## 2013-12-25 NOTE — Progress Notes (Signed)
Patient ID: Colleen Lee, female   DOB: [birth year], 58 y.o.   MRN: [id number] Chief Complaint  Patient presents with  . Follow-up    2 month recheck on back.    Complaint pain right thumb. The patient complains of radial pain over the right thumb. She does have a history of previous surgery over the metacarpophalangeal joint of her pain is more in the wrist area present for several days with no trauma associated with an aching burning sensation and painful ulnar deviation.  Review of systems musculoskeletal joint pain as stated in the thumb she denies any red flags but has increased pain in her left leg  Vital signs: BP 146/91  Ht 5\' 6"  (1.676 m)  Wt 145 lb (65.772 kg)  BMI 23.41 kg/m2   General the patient is well-developed and well-nourished grooming and hygiene are normal Oriented x3 Mood and affect normal Ambulation normal Inspection of the right thumb tenderness painful ulnar deviation but full passive range of motion. Stability is normal. Scar over the metacarpophalangeal joint motor exam intact skin intact Radial pulses normal Sensory exam normal  Encounter Diagnoses  Name Primary?  . Back pain Yes  . Radicular pain of left lower extremity   . De Quervain's disease (radial styloid tenosynovitis)     Splint 6 weeks right thumb  Refill medications continue methocarbamol  Return 3 months  Meds ordered this encounter  Medications  . HYDROcodone-acetaminophen (NORCO/VICODIN) 5-325 MG per tablet    Sig: Take 1 tablet by mouth every 4 (four) hours as needed for moderate pain.    Dispense:  180 tablet    Refill:  0

## 2013-12-25 NOTE — Patient Instructions (Signed)
Wear brace 3 months

## 2013-12-30 ENCOUNTER — Emergency Department (HOSPITAL_COMMUNITY)
Admission: EM | Admit: 2013-12-30 | Discharge: 2013-12-30 | Disposition: A | Discharge status: Home / Self Care | Payer: Medicare Other | Attending: Emergency Medicine | Admitting: Emergency Medicine

## 2013-12-30 ENCOUNTER — Encounter (HOSPITAL_COMMUNITY): Payer: Self-pay | Admitting: Emergency Medicine

## 2013-12-30 DIAGNOSIS — F17200 Nicotine dependence, unspecified, uncomplicated: Secondary | ICD-10-CM | POA: Insufficient documentation

## 2013-12-30 DIAGNOSIS — K5289 Other specified noninfective gastroenteritis and colitis: Secondary | ICD-10-CM | POA: Insufficient documentation

## 2013-12-30 DIAGNOSIS — Z79899 Other long term (current) drug therapy: Secondary | ICD-10-CM | POA: Insufficient documentation

## 2013-12-30 DIAGNOSIS — J45909 Unspecified asthma, uncomplicated: Secondary | ICD-10-CM | POA: Insufficient documentation

## 2013-12-30 DIAGNOSIS — Z880 Allergy status to penicillin: Secondary | ICD-10-CM | POA: Insufficient documentation

## 2013-12-30 DIAGNOSIS — Z8719 Personal history of other diseases of the digestive system: Secondary | ICD-10-CM | POA: Insufficient documentation

## 2013-12-30 DIAGNOSIS — I10 Essential (primary) hypertension: Secondary | ICD-10-CM | POA: Insufficient documentation

## 2013-12-30 DIAGNOSIS — Z9889 Other specified postprocedural states: Secondary | ICD-10-CM | POA: Insufficient documentation

## 2013-12-30 DIAGNOSIS — Z8619 Personal history of other infectious and parasitic diseases: Secondary | ICD-10-CM | POA: Insufficient documentation

## 2013-12-30 DIAGNOSIS — K529 Noninfective gastroenteritis and colitis, unspecified: Secondary | ICD-10-CM

## 2013-12-30 DIAGNOSIS — Z8742 Personal history of other diseases of the female genital tract: Secondary | ICD-10-CM | POA: Insufficient documentation

## 2013-12-30 LAB — I-STAT CHEM 8, ED
BUN: 6 mg/dL (ref 6–23)
Calcium, Ion: 1.02 mmol/L — ABNORMAL LOW (ref 1.12–1.23)
Chloride: 108 mEq/L (ref 96–112)
Creatinine, Ser: 0.7 mg/dL (ref 0.50–1.10)
Glucose, Bld: 109 mg/dL — ABNORMAL HIGH (ref 70–99)
HCT: 44 % (ref 36.0–46.0)
Hemoglobin: 15 g/dL (ref 12.0–15.0)
Potassium: 3.3 mEq/L — ABNORMAL LOW (ref 3.7–5.3)
Sodium: 141 mEq/L (ref 137–147)
TCO2: 20 mmol/L (ref 0–100)

## 2013-12-30 MED ORDER — HYDROMORPHONE HCL PF 1 MG/ML IJ SOLN
1.0000 mg | Freq: Once | INTRAMUSCULAR | Status: AC
Start: 2013-12-30 — End: 2013-12-30
  Administered 2013-12-30: 1 mg via INTRAMUSCULAR
  Filled 2013-12-30: qty 1

## 2013-12-30 MED ORDER — SODIUM CHLORIDE 0.9 % IV BOLUS (SEPSIS)
1000.0000 mL | Freq: Once | INTRAVENOUS | Status: AC
  Administered 2013-12-30: 1000 mL via INTRAVENOUS

## 2013-12-30 MED ORDER — HYDROMORPHONE HCL PF 1 MG/ML IJ SOLN
1.0000 mg | Freq: Once | INTRAMUSCULAR | Status: AC
  Administered 2013-12-30: 1 mg via INTRAVENOUS

## 2013-12-30 MED ORDER — POTASSIUM CHLORIDE CRYS ER 20 MEQ PO TBCR
40.0000 meq | EXTENDED_RELEASE_TABLET | Freq: Once | ORAL | Status: AC
  Administered 2013-12-30: 40 meq via ORAL
  Filled 2013-12-30: qty 2

## 2013-12-30 MED ORDER — PROMETHAZINE HCL 25 MG/ML IJ SOLN
25.0000 mg | Freq: Once | INTRAMUSCULAR | Status: AC
  Administered 2013-12-30: 25 mg via INTRAMUSCULAR
  Filled 2013-12-30: qty 1

## 2013-12-30 MED ORDER — HYDROMORPHONE HCL PF 1 MG/ML IJ SOLN
1.0000 mg | Freq: Once | INTRAMUSCULAR | Status: DC
  Filled 2013-12-30: qty 1

## 2013-12-30 MED ORDER — PROMETHAZINE HCL 25 MG PO TABS: 25.0000 mg | ORAL_TABLET | Freq: Four times a day (QID) | ORAL | Status: DC | PRN

## 2013-12-30 NOTE — ED Notes (Signed)
Patient with no complaints at this time. Respirations even and unlabored. Skin warm/dry. Discharge instructions reviewed with patient at this time. Patient given opportunity to voice concerns/ask questions. IV removed per policy and band-aid applied to site. Patient discharged at this time and left Emergency Department with steady gait.  

## 2013-12-30 NOTE — ED Provider Notes (Signed)
CSN: [id number]     Arrival date & time 12/30/13  1708 History   First MD Initiated Contact with Patient 12/30/13 1753     Chief Complaint  Patient presents with  . Abdominal Pain     (Consider location/radiation/quality/duration/timing/severity/associated sxs/prior Treatment) Patient is a 58 y.o. female presenting with vomiting. The history is provided by the patient (the pt complains of vomiting and diarrhea).  Emesis Severity:  Moderate Timing:  Intermittent Quality:  Undigested food Able to tolerate:  Liquids Progression:  Improving Associated symptoms: diarrhea   Associated symptoms: no abdominal pain and no headaches     Past Medical History  Diagnosis Date  . Hypertension   . Fibroids   . [name]     HCV RNA positive 09/2012  . GERD (gastroesophageal reflux disease)   . H. pylori infection 2014    treated with pylera  . Asthma   . PONV (postoperative nausea and vomiting)     pt thinks maybe once she had N&V   Past Surgical History  Procedure Laterality Date  . Knee surgery      left knee  . Colonoscopy  01/18/2008    MWN:[id number] rectum.  Long redundant colon, a diminutive sigmoid polyp status post cold biopsy removed. Hyperplastic polyp. Repeat colonoscopy June 2014 due to family history of colon cancer  . Esophagogastroduodenoscopy  01/18/2008    RMR: Normal esophagus, normal  stomach  . Colonoscopy with esophagogastroduodenoscopy (egd) N/A 11/02/2012    GUY:[id number] gastric mucosa of doubtful, +H.pylori. Incomplete colonoscopy due to patient unable to tolerate exam, proximal colon seen. Patient refused ACBE.  . Colonoscopy with propofol N/A 03/08/2013    Dr. Gala Romney: colonic polyp-removed as scribed above. Internal Hemorrhoids. Pathology did not reveal any colonic tissue, only mucus. SURVEILLANCE DUE Aug 2019  . Polypectomy N/A 03/08/2013    Procedure: POLYPECTOMY;  Surgeon: Daneil Dolin, MD;  Location: [facility];  Service: Endoscopy;  Laterality: N/A;  . Multiple  extractions with alveoloplasty N/A 10/15/2013    Procedure: MULTIPLE EXTRACTION WITH ALVEOLOPLASTY;  Surgeon: Gae Bon, DDS;  Location: [place];  Service: Oral Surgery;  Laterality: N/A;   Family History  Problem Relation Age of Onset  . Colon cancer Brother        . Asthma      FH  . Multiple myeloma Brother   . Liver cancer Sister   . Prostate cancer Brother   . Pancreatic cancer Brother   . Cancer Other     niece, age 58, primary unknown   History  Substance Use Topics  . Smoking status: Current Every Day Smoker -- 1.00 packs/day for 40 years    Types: Cigarettes  . Smokeless tobacco: Not on file  . Alcohol Use: No     Comment: former, last Jan 2014   OB History   Grav Para Term Preterm Abortions TAB SAB Ect Mult Living                 Review of Systems  Constitutional: Negative for appetite change and fatigue.  HENT: Negative for congestion, ear discharge and sinus pressure.   Eyes: Negative for discharge.  Respiratory: Negative for cough.   Cardiovascular: Negative for chest pain.  Gastrointestinal: Positive for nausea, vomiting and diarrhea. Negative for abdominal pain.  Genitourinary: Negative for frequency and hematuria.  Musculoskeletal: Negative for back pain.  Skin: Negative for rash.  Neurological: Negative for seizures and headaches.  Psychiatric/Behavioral: Negative for hallucinations.      Allergies  Penicillins  Home Medications   Prior to Admission medications   Medication Sig Start Date End Date Taking? Authorizing Provider  Cyanocobalamin (VITAMIN B 12 PO) Take 1 tablet by mouth daily.   Yes Historical Provider, MD  HYDROcodone-acetaminophen (NORCO/VICODIN) 5-325 MG per tablet Take 1 tablet by mouth every 4 (four) hours as needed for moderate pain. 12/25/13  Yes Carole Civil, MD  Linaclotide [facility]) 290 MCG CAPS capsule Take 1 capsule (290 mcg total) by mouth daily. 12/06/13  Yes Orvil Feil, NP  lisinopril (PRINIVIL,ZESTRIL) 20 MG  tablet Take 20 mg by mouth daily.   Yes Historical Provider, MD  megestrol (MEGACE) 40 MG/ML suspension Take 200 mg by mouth daily.   Yes Historical Provider, MD  methocarbamol (ROBAXIN) 500 MG tablet Take 1 tablet (500 mg total) by mouth 3 (three) times daily. 10/25/13  Yes Carole Civil, MD  ondansetron (ZOFRAN ODT) 8 MG disintegrating tablet Take 1 tablet (8 mg total) by mouth every 8 (eight) hours as needed for nausea. 05/01/13  Yes Carole Civil, MD  potassium chloride SA (K-DUR,KLOR-CON) 20 MEQ tablet Take 20 mEq by mouth daily.   Yes Historical Provider, MD  promethazine (PHENERGAN) 25 MG tablet Take 1 tablet (25 mg total) by mouth every 6 (six) hours as needed for nausea or vomiting. 12/30/13   Maudry Diego, MD   BP 143/91  Pulse 76  Temp(Src) 99.3 F (37.4 C) (Oral)  Resp 16  Ht $R'5\' 6"'qC$  (1.676 m)  Wt 138 lb (62.596 kg)  BMI 22.28 kg/m2  SpO2 98% Physical Exam  Constitutional: She is oriented to person, place, and time. She appears well-developed.  HENT:  Head: Normocephalic.  Eyes: Conjunctivae and EOM are normal. No scleral icterus.  Neck: Neck supple. No thyromegaly present.  Cardiovascular: Normal rate and regular rhythm.  Exam reveals no gallop and no friction rub.   No murmur heard. Pulmonary/Chest: No stridor. She has no wheezes. She has no rales. She exhibits no tenderness.  Abdominal: She exhibits no distension. There is tenderness. There is no rebound.  Mild tenderness throughout  Musculoskeletal: Normal range of motion. She exhibits no edema.  Lymphadenopathy:    She has no cervical adenopathy.  Neurological: She is oriented to person, place, and time. She exhibits normal muscle tone. Coordination normal.  Skin: No rash noted. No erythema.  Psychiatric: She has a normal mood and affect. Her behavior is normal.    ED Course  Procedures (including critical care time) Labs Review Labs Reviewed  I-STAT CHEM 8, ED - Abnormal; Notable for the following:     Potassium 3.3 (*)    Glucose, Bld 109 (*)    Calcium, Ion 1.02 (*)    All other components within normal limits    Imaging Review No results found.   EKG Interpretation None      MDM   Final diagnoses:  Gastroenteritis    Pt improved with tx   Maudry Diego, MD 12/30/13 2111

## 2013-12-30 NOTE — ED Notes (Signed)
Pt c/o n/v/d abd pain, chills that started yesterday,

## 2013-12-30 NOTE — Discharge Instructions (Signed)
Drink plenty of fluids and follow up with your md this week. °

## 2014-01-10 ENCOUNTER — Ambulatory Visit: Payer: Medicare Other | Admitting: Gastroenterology

## 2014-01-21 ENCOUNTER — Ambulatory Visit: Payer: Medicare Other | Admitting: Gastroenterology

## 2014-01-21 ENCOUNTER — Telehealth: Payer: Self-pay | Admitting: Gastroenterology

## 2014-01-21 NOTE — Telephone Encounter (Signed)
Mailed letter °

## 2014-01-21 NOTE — Telephone Encounter (Signed)
Pt was a no show

## 2014-01-28 ENCOUNTER — Other Ambulatory Visit: Payer: Self-pay | Admitting: Orthopedic Surgery

## 2014-01-28 ENCOUNTER — Telehealth: Payer: Self-pay | Admitting: Orthopedic Surgery

## 2014-01-28 DIAGNOSIS — M5442 Lumbago with sciatica, left side: Secondary | ICD-10-CM

## 2014-01-28 DIAGNOSIS — M5410 Radiculopathy, site unspecified: Secondary | ICD-10-CM

## 2014-01-28 MED ORDER — HYDROCODONE-ACETAMINOPHEN 5-325 MG PO TABS: 1.0000 | ORAL_TABLET | ORAL | Status: DC | PRN

## 2014-01-28 NOTE — Telephone Encounter (Signed)
To Dr Aline Brochure for review

## 2014-01-28 NOTE — Telephone Encounter (Signed)
Patient called to request refill of medication: HYDROcodone-acetaminophen (NORCO/VICODIN) 5-325 MG per tablet [[id number]] Her phone # is [phone].

## 2014-01-28 NOTE — Telephone Encounter (Signed)
Patient picked up prescription today, 01/28/14.

## 2014-02-14 ENCOUNTER — Ambulatory Visit (INDEPENDENT_AMBULATORY_CARE_PROVIDER_SITE_OTHER): Payer: Medicare HMO | Admitting: Gastroenterology

## 2014-02-14 ENCOUNTER — Encounter (INDEPENDENT_AMBULATORY_CARE_PROVIDER_SITE_OTHER): Payer: Self-pay

## 2014-02-14 ENCOUNTER — Encounter: Payer: Self-pay | Admitting: Gastroenterology

## 2014-02-14 ENCOUNTER — Other Ambulatory Visit: Payer: Self-pay | Admitting: Gastroenterology

## 2014-02-14 VITALS — BP 125/74 | HR 52 | Temp 98.1°F | Ht 66.0 in | Wt 136.4 lb

## 2014-02-14 DIAGNOSIS — Z1211 Encounter for screening for malignant neoplasm of colon: Secondary | ICD-10-CM

## 2014-02-14 DIAGNOSIS — K5900 Constipation, unspecified: Secondary | ICD-10-CM

## 2014-02-14 DIAGNOSIS — B182 Chronic viral hepatitis C: Secondary | ICD-10-CM

## 2014-02-14 MED ORDER — SOD PICOSULFATE-MAG OX-CIT ACD 10-3.5-12 MG-GM-GM PO PACK: 1.0000 | PACK | ORAL | Status: DC

## 2014-02-14 NOTE — Progress Notes (Signed)
Referring Provider: Raiford Simmonds., PA-C Primary Care Physician:  Rosita Fire, MD Primary GI: Dr. Gala Romney  Chief Complaint  Patient presents with  . Hep C    Discuss Hep C    HPI:   58 year old very pleasant female presents today with a history of constipation and chronic [name], +RNA last year. Linzess 290 mcg daily doing well. Next surveillance in 2019. Genotype 1a, last HCV RNA in March 2014. No PPI. No OTC herbal medications. No prior treatment. No abdominal pain. No concerns. Excited about starting Hep C treatment. Next colonoscopy due in 2019.   Past Medical History  Diagnosis Date  . Hypertension   . Fibroids   . [name]     HCV RNA positive 09/2012  . GERD (gastroesophageal reflux disease)   . H. pylori infection 2014    treated with pylera  . Asthma   . PONV (postoperative nausea and vomiting)     pt thinks maybe once she had N&V    Past Surgical History  Procedure Laterality Date  . Knee surgery      left knee  . Colonoscopy  01/18/2008    FOY:DXAJOI rectum.  Long redundant colon, a diminutive sigmoid polyp status post cold biopsy removed. Hyperplastic polyp. Repeat colonoscopy June 2014 due to family history of colon cancer  . Esophagogastroduodenoscopy  01/18/2008    RMR: Normal esophagus, normal  stomach  . Colonoscopy with esophagogastroduodenoscopy (egd) N/A 11/02/2012    NOM:[id number] gastric mucosa of doubtful, +H.pylori. Incomplete colonoscopy due to patient unable to tolerate exam, proximal colon seen. Patient refused ACBE.  . Colonoscopy with propofol N/A 03/08/2013    Dr. Gala Romney: colonic polyp-removed as scribed above. Internal Hemorrhoids. Pathology did not reveal any colonic tissue, only mucus. SURVEILLANCE DUE Aug 2019  . Polypectomy N/A 03/08/2013    Procedure: POLYPECTOMY;  Surgeon: Daneil Dolin, MD;  Location: [facility];  Service: Endoscopy;  Laterality: N/A;  . Multiple extractions with alveoloplasty N/A 10/15/2013    Procedure: MULTIPLE  EXTRACTION WITH ALVEOLOPLASTY;  Surgeon: Gae Bon, DDS;  Location: [place];  Service: Oral Surgery;  Laterality: N/A;    Current Outpatient Prescriptions  Medication Sig Dispense Refill  . Cyanocobalamin (VITAMIN B 12 PO) Take 1 tablet by mouth daily.      Marland Kitchen HYDROcodone-acetaminophen (NORCO/VICODIN) 5-325 MG per tablet Take 1 tablet by mouth every 4 (four) hours as needed for moderate pain.  180 tablet  0  . Linaclotide (LINZESS) 290 MCG CAPS capsule Take 1 capsule (290 mcg total) by mouth daily.  30 capsule  11  . lisinopril (PRINIVIL,ZESTRIL) 20 MG tablet Take 20 mg by mouth daily.      . megestrol (MEGACE) 40 MG/ML suspension Take 200 mg by mouth daily.      . methocarbamol (ROBAXIN) 500 MG tablet Take 1 tablet (500 mg total) by mouth 3 (three) times daily.  60 tablet  5  . ondansetron (ZOFRAN ODT) 8 MG disintegrating tablet Take 1 tablet (8 mg total) by mouth every 8 (eight) hours as needed for nausea.  60 tablet  0  . potassium chloride SA (K-DUR,KLOR-CON) 20 MEQ tablet Take 20 mEq by mouth daily.      . promethazine (PHENERGAN) 25 MG tablet Take 1 tablet (25 mg total) by mouth every 6 (six) hours as needed for nausea or vomiting.  15 tablet  0   No current facility-administered medications for this visit.    Allergies as of 02/14/2014 - Review Complete 02/14/2014  Allergen Reaction Noted  . Penicillins Rash     Family History  Problem Relation Age of Onset  . Colon cancer Brother        . Asthma      FH  . Multiple myeloma Brother   . Liver cancer Sister   . Prostate cancer Brother   . Pancreatic cancer Brother   . Cancer Other     niece, age 64, primary unknown    History   Social History  . Marital Status: Married    Spouse Name: N/A    Number of Children: 0  . Years of Education: N/A   Occupational History  . umemployed     Social History Main Topics  . Smoking status: Current Every Day Smoker -- 1.00 packs/day for 40 years    Types: Cigarettes  .  Smokeless tobacco: None     Comment: Smokes one pack of cigarettes daily  . Alcohol Use: No     Comment: former, last Jan 2014  . Drug Use: No     Comment: history of marijuana in past  . Sexual Activity: No   Other Topics Concern  . None   Social History Narrative  . None    Review of Systems: As mentioned in HPI.   Physical Exam: BP 125/74  Pulse 52  Temp(Src) 98.1 F (36.7 C) (Oral)  Ht $R'5\' 6"'HK$  (1.676 m)  Wt 136 lb 6.4 oz (61.871 kg)  BMI 22.03 kg/m2 General:   Alert and oriented. No distress noted. Pleasant and cooperative.  Head:  Normocephalic and atraumatic. Eyes:  Conjuctiva clear without scleral icterus. Mouth:  Oral mucosa pink and moist.  Heart:  S1, S2 present without murmurs, rubs, or gallops. Regular rate and rhythm. Abdomen:  +BS, soft, non-tender and non-distended. No rebound or guarding. No HSM or masses noted. ?possible small umbilical hernia noted.  Msk:  Symmetrical without gross deformities. Normal posture. Extremities:  Without edema. Neurologic:  Alert and  oriented x4;  grossly normal neurologically. Skin:  Intact without significant lesions or rashes. Psych:  Alert and cooperative. Normal mood and affect.  Lab Results  Component Value Date   WBC 5.9 12/05/2013   HGB 15.0 12/30/2013   HCT 44.0 12/30/2013   MCV 91.0 12/05/2013   PLT 153 12/05/2013   Lab Results  Component Value Date   ALT 25 12/05/2013   AST 33 12/05/2013   ALKPHOS 97 12/05/2013   BILITOT 0.6 12/05/2013   Lab Results  Component Value Date   CREATININE 0.70 12/30/2013   BUN 6 12/30/2013   NA 141 12/30/2013   K 3.3* 12/30/2013   CL 108 12/30/2013   CO2 25 12/05/2013

## 2014-02-14 NOTE — Progress Notes (Signed)
cc'd to pcp 

## 2014-02-14 NOTE — Assessment & Plan Note (Signed)
Doing well on Linzess 290 mcg daily. Failed Amitiza. Continue current treatment. Next surveillance colonoscopy in 2019.

## 2014-02-14 NOTE — Assessment & Plan Note (Addendum)
Last RNA 2014. Needs updated for insurance submission. Last abdominal imaging Dec 2013. Needs [place] with elastography to assess for fibrosis/cirrhosis. Treatment naive. Good candidate for Harvoni. No contraindications noted. Not currently on a PPI or statin. Denies OTC agents. Will proceed with standard labs, imaging, and pursue Harvoni treatment. Needs Hep B vaccination. Rx provided.

## 2014-02-14 NOTE — Patient Instructions (Signed)
Continue to take Linzess once each morning.  We have scheduled a special ultrasound to assess your liver, which will help determine how long the treatment course for [name] will be.  Take the prescription for your Hepatitis B vaccination to your primary care doctor to have completed.  Please have blood work completed. As soon as the ultrasound results are back, I will be sending the prescription to a specialty pharmacy. IF the medication gets sent to you, do not take yet. Make sure you call us and let us know you have it. Then, we can review instructions and set up an appointment for you to come in after starting the medication.

## 2014-02-15 ENCOUNTER — Other Ambulatory Visit: Payer: Self-pay | Admitting: Gastroenterology

## 2014-02-15 MED ORDER — PEG 3350-KCL-NA BICARB-NACL 420 G PO SOLR: 4000.0000 mL | ORAL | Status: DC

## 2014-02-19 ENCOUNTER — Encounter (HOSPITAL_COMMUNITY): Payer: Self-pay | Admitting: Pharmacy Technician

## 2014-02-19 ENCOUNTER — Ambulatory Visit (HOSPITAL_COMMUNITY)
Admission: RE | Admit: 2014-02-19 | Discharge: 2014-02-19 | Disposition: A | Discharge status: Home / Self Care | Payer: Medicare HMO | Source: Ambulatory Visit | Attending: Gastroenterology | Admitting: Gastroenterology

## 2014-02-19 DIAGNOSIS — B1920 Unspecified viral hepatitis C without hepatic coma: Secondary | ICD-10-CM | POA: Diagnosis not present

## 2014-02-19 DIAGNOSIS — B182 Chronic viral hepatitis C: Secondary | ICD-10-CM

## 2014-02-20 ENCOUNTER — Telehealth: Payer: Self-pay | Admitting: General Practice

## 2014-02-20 ENCOUNTER — Telehealth: Payer: Self-pay | Admitting: Orthopedic Surgery

## 2014-02-20 LAB — HCV RNA QUANT RFLX ULTRA OR GENOTYP
HCV Quantitative Log: 6.62 {Log} — ABNORMAL HIGH (ref <1.18)
HCV Quantitative: 4162162 IU/mL — ABNORMAL HIGH (ref <15)

## 2014-02-20 NOTE — Telephone Encounter (Signed)
Routing to dr harrison 

## 2014-02-20 NOTE — Telephone Encounter (Signed)
Colleen Lee asked for a prescription for Hydrocodone 5/325

## 2014-02-20 NOTE — Telephone Encounter (Signed)
Patient picked up a Rx for a prep at [facility], however she does not need it, because she is not having a tcs.  Patient was informed by Darius Bump to take Rx back.  I spoke with Nicki Reaper, pharmacist at [facility] and he stated that he would reimbures the patient and no worries about [place] paying for Rx, because it was only $1.20.

## 2014-02-21 ENCOUNTER — Other Ambulatory Visit: Payer: Self-pay | Admitting: Orthopedic Surgery

## 2014-02-21 DIAGNOSIS — M5442 Lumbago with sciatica, left side: Secondary | ICD-10-CM

## 2014-02-21 DIAGNOSIS — M5410 Radiculopathy, site unspecified: Secondary | ICD-10-CM

## 2014-02-21 MED ORDER — HYDROCODONE-ACETAMINOPHEN 5-325 MG PO TABS: 1.0000 | ORAL_TABLET | ORAL | Status: DC | PRN

## 2014-02-21 NOTE — Telephone Encounter (Signed)
Patient picked up the prescription.

## 2014-02-25 LAB — HEPATITIS C GENOTYPE

## 2014-03-04 ENCOUNTER — Ambulatory Visit: Admit: 2014-03-04 | Payer: Self-pay | Admitting: Gastroenterology

## 2014-03-04 SURGERY — COLONOSCOPY
Anesthesia: Moderate Sedation

## 2014-03-05 ENCOUNTER — Encounter: Payer: Self-pay | Admitting: Gastroenterology

## 2014-03-05 MED ORDER — LEDIPASVIR-SOFOSBUVIR 90-400 MG PO TABS: 1.0000 | ORAL_TABLET | Freq: Every day | ORAL | Status: DC

## 2014-03-05 NOTE — Progress Notes (Signed)
Quick Note:  Chronic Hep C. Metavir score 4, consistent with cirrhosis.  Will need Harvoni for 12 weeks. I have sent in prescription. Please have patient contact us when she receives this. DO NOT TAKE UNTIL SHE CONTACTS [place]. Then, she will return in 4 weeks after starting treatment. ______

## 2014-03-05 NOTE — Addendum Note (Signed)
Addended by: Orvil Feil on: 03/05/2014 05:04 PM   Modules accepted: Orders

## 2014-03-11 ENCOUNTER — Telehealth: Payer: Self-pay

## 2014-03-11 NOTE — Telephone Encounter (Signed)
Received fax from pts insurance. Harvoni has been approved until 05/30/14. I tried to call pt, she was not home, I was told that I would have to call back later.

## 2014-03-11 NOTE — Telephone Encounter (Signed)
Let patient know she can start [place], once daily at the same time each day. Do not miss a dose.   Do not start any new medications RX or OTC without consulting [place] first.   Please schedule appt for four weeks after first dose, with Anna. Vicente Males can give future lab orders as deemed appropriate.

## 2014-03-12 NOTE — Telephone Encounter (Signed)
Tried to call pt again, was told she was not home. LM to return call.

## 2014-03-12 NOTE — Telephone Encounter (Signed)
Pt is aware. She will call us when she receives her medication.

## 2014-03-13 ENCOUNTER — Encounter: Payer: Self-pay | Admitting: Internal Medicine

## 2014-03-13 NOTE — Telephone Encounter (Signed)
Routing to AS 

## 2014-03-13 NOTE — Telephone Encounter (Signed)
Pt is aware of OV on 9/11 at 9 with AS and appt card mailed

## 2014-03-13 NOTE — Telephone Encounter (Signed)
Pt called, she received her Harvoni today. I informed her that she could start taking it and to make sure she took it the same time everyday and that if she is given any new rx's or wants to take something otc, she needs to call us first.   Manuela Schwartz, please schedule pt in 4 weeks with AS for a Harvoni follow up.

## 2014-03-13 NOTE — Telephone Encounter (Signed)
Noted. Save for Vicente Males.

## 2014-03-19 NOTE — Telephone Encounter (Signed)
Noted  

## 2014-03-27 ENCOUNTER — Encounter: Payer: Self-pay | Admitting: Orthopedic Surgery

## 2014-03-27 ENCOUNTER — Ambulatory Visit (INDEPENDENT_AMBULATORY_CARE_PROVIDER_SITE_OTHER): Payer: Medicare HMO | Admitting: Orthopedic Surgery

## 2014-03-27 VITALS — BP 151/94 | Ht 66.0 in | Wt 136.0 lb

## 2014-03-27 DIAGNOSIS — M5410 Radiculopathy, site unspecified: Secondary | ICD-10-CM

## 2014-03-27 DIAGNOSIS — M5430 Sciatica, unspecified side: Secondary | ICD-10-CM

## 2014-03-27 DIAGNOSIS — IMO0002 Reserved for concepts with insufficient information to code with codable children: Secondary | ICD-10-CM

## 2014-03-27 DIAGNOSIS — M5442 Lumbago with sciatica, left side: Secondary | ICD-10-CM

## 2014-03-27 MED ORDER — HYDROCODONE-ACETAMINOPHEN 5-325 MG PO TABS: 1.0000 | ORAL_TABLET | ORAL | Status: DC | PRN

## 2014-03-27 NOTE — Patient Instructions (Signed)
Call office when ready to schedule surgery   Call when need prescription s

## 2014-03-27 NOTE — Progress Notes (Signed)
Routine followup for medication refill and reassessment of her ongoing problems with her left leg. She is still not ready to have the surgery I think she's afraid. Each time she comes in she has some family issues that prevent her from having it. She says her stepsister is now in hospice for throat cancer  She is having radicular symptoms in her left leg. Her bowel and bladder function remain intact.  She is awake alert and oriented x3 mood is normal she's or drag the left leg. Hip and knee range of motion remain normal her knee is stable her hip is stable she doesn't have a major motor weakness chest or radicular pain she says is getting worse  Impression Chronic pain continue hydrocodone Radicular pain left leg call office when she's ready to have surgery. Otherwise an overlay think is a need for her to keep coming in to the office. She can manage her medications as long as they don't change as an outpatient.

## 2014-03-28 ENCOUNTER — Ambulatory Visit: Payer: Medicare HMO | Admitting: Orthopedic Surgery

## 2014-04-05 ENCOUNTER — Other Ambulatory Visit: Payer: Self-pay | Admitting: *Deleted

## 2014-04-05 MED ORDER — IBUPROFEN 800 MG PO TABS: 800.0000 mg | ORAL_TABLET | Freq: Three times a day (TID) | ORAL | Status: DC | PRN

## 2014-04-11 ENCOUNTER — Ambulatory Visit (INDEPENDENT_AMBULATORY_CARE_PROVIDER_SITE_OTHER): Payer: Medicare HMO | Admitting: Gastroenterology

## 2014-04-11 ENCOUNTER — Encounter: Payer: Self-pay | Admitting: Gastroenterology

## 2014-04-11 VITALS — BP 134/72 | HR 53 | Temp 99.9°F | Ht 66.0 in | Wt 142.4 lb

## 2014-04-11 DIAGNOSIS — B182 Chronic viral hepatitis C: Secondary | ICD-10-CM

## 2014-04-11 DIAGNOSIS — K7460 Unspecified cirrhosis of liver: Secondary | ICD-10-CM

## 2014-04-11 NOTE — Patient Instructions (Addendum)
Continue taking Harvoni each morning.   Continue Linzess each morning on an empty stomach. If needed you may take Miralax to help.   DO NOT TAKE THE PROBIOTIC UNTIL YOU HEAR FROM [place]. We are double checking with the medical scientists with Harvoni.   Please complete blood work today.   I would like to see you in 2 months.

## 2014-04-11 NOTE — Progress Notes (Signed)
cc'ed to pcp °

## 2014-04-11 NOTE — Progress Notes (Signed)
Referring Provider: Rosita Fire, MD Primary Care Physician:  Rosita Fire, MD Primary GI: Dr. Gala Romney   Chief Complaint  Patient presents with  . Follow-up    follow up on Harvoni/ said she is feeling some better    HPI:   58 year old very pleasant female presents today with a history of constipation and chronic [name], +RNA last year. Linzess 290 mcg daily doing well. Next surveillance in 2019. Genotype 1a, last HCV RNA in March 2014. Treatment naive. Started Harvoni on 8/12. Needs for total of 12 weeks.   Feels like she has more energy now. Gets up and goes easier. Taking Linzess 290 mcg daily. Drank some coffee today with some good results. Feeling a little constipated. Feels like if she could have a good BM, she would be ok.       Past Medical History  Diagnosis Date  . Hypertension   . Fibroids   . [name]     HCV RNA positive 09/2012  . GERD (gastroesophageal reflux disease)   . H. pylori infection 2014    treated with pylera  . Asthma   . PONV (postoperative nausea and vomiting)     pt thinks maybe once she had N&V  . Cirrhosis     Metavir score F4 on elastography 2015    Past Surgical History  Procedure Laterality Date  . Knee surgery      left knee  . Colonoscopy  01/18/2008    VVO:HYWVPX rectum.  Long redundant colon, a diminutive sigmoid polyp status post cold biopsy removed. Hyperplastic polyp. Repeat colonoscopy June 2014 due to family history of colon cancer  . Esophagogastroduodenoscopy  01/18/2008    RMR: Normal esophagus, normal  stomach  . Colonoscopy with esophagogastroduodenoscopy (egd) N/A 11/02/2012    TGG:YIRSWNIO gastric mucosa of doubtful, +H.pylori. Incomplete colonoscopy due to patient unable to tolerate exam, proximal colon seen. Patient refused ACBE.  . Colonoscopy with propofol N/A 03/08/2013    Dr. Gala Romney: colonic polyp-removed as scribed above. Internal Hemorrhoids. Pathology did not reveal any colonic tissue, only mucus.  SURVEILLANCE DUE Aug 2019  . Polypectomy N/A 03/08/2013    Procedure: POLYPECTOMY;  Surgeon: Daneil Dolin, MD;  Location: [facility];  Service: Endoscopy;  Laterality: N/A;  . Multiple extractions with alveoloplasty N/A 10/15/2013    Procedure: MULTIPLE EXTRACTION WITH ALVEOLOPLASTY;  Surgeon: Gae Bon, DDS;  Location: [place];  Service: Oral Surgery;  Laterality: N/A;    Current Outpatient Prescriptions  Medication Sig Dispense Refill  . Cyanocobalamin (VITAMIN B 12 PO) Take 1 tablet by mouth daily.      Marland Kitchen HYDROcodone-acetaminophen (NORCO/VICODIN) 5-325 MG per tablet Take 1 tablet by mouth every 4 (four) hours as needed for moderate pain.  180 tablet  0  . ibuprofen (ADVIL,MOTRIN) 800 MG tablet Take 1 tablet (800 mg total) by mouth every 8 (eight) hours as needed.  90 tablet  5  . Ledipasvir-Sofosbuvir (HARVONI) 90-400 MG TABS Take 1 capsule by mouth daily.  90 tablet  0  . Linaclotide (LINZESS) 290 MCG CAPS capsule Take 1 capsule (290 mcg total) by mouth daily.  30 capsule  11  . lisinopril (PRINIVIL,ZESTRIL) 20 MG tablet Take 20 mg by mouth daily.      . potassium chloride SA (K-DUR,KLOR-CON) 20 MEQ tablet Take 20 mEq by mouth daily.      . megestrol (MEGACE) 40 MG/ML suspension Take 200 mg by mouth daily.       No current facility-administered medications  for this visit.    Allergies as of 04/11/2014 - Review Complete 04/11/2014  Allergen Reaction Noted  . Penicillins Rash     Family History  Problem Relation Age of Onset  . Colon cancer Brother        . Asthma      FH  . Multiple myeloma Brother   . Liver cancer Sister   . Prostate cancer Brother   . Pancreatic cancer Brother   . Cancer Other     niece, age 41, primary unknown    History   Social History  . Marital Status: Married    Spouse Name: N/A    Number of Children: 0  . Years of Education: N/A   Occupational History  . umemployed     Social History Main Topics  . Smoking status: Current Every Day Smoker  -- 1.00 packs/day for 40 years    Types: Cigarettes  . Smokeless tobacco: None     Comment: Smokes one pack of cigarettes daily  . Alcohol Use: No     Comment: former, last Jan 2014  . Drug Use: No     Comment: history of marijuana in past  . Sexual Activity: No   Other Topics Concern  . None   Social History Narrative  . None    Review of Systems: As mentioned in HPI  Physical Exam: BP 134/72  Pulse 53  Temp(Src) 99.9 F (37.7 C)  Ht _0  (1.676 m)  Wt 142 lb 6.4 oz (64.592 kg)  BMI 22.99 kg/m2 General:   Alert and oriented. No distress noted. Pleasant and cooperative.  Head:  Normocephalic and atraumatic. Eyes:  Conjuctiva clear without scleral icterus. Abdomen:  +BS, soft, non-tender and non-distended. No rebound or guarding. No HSM or masses noted. Msk:  Symmetrical without gross deformities. Normal posture. Extremities:  Without edema. Neurologic:  Alert and  oriented x4;  grossly normal neurologically. Skin:  Intact without significant lesions or rashes. Psych:  Alert and cooperative. Normal mood and affect.

## 2014-04-11 NOTE — Assessment & Plan Note (Signed)
Completed 4 weeks of Harvoni. No side effects or concerns. Chronic constipation noted, with some worsening recently. Add Miralax prn to scheduled Linzess 290 mcg dosing.  Check CBC, HFP, BMP, HCV RNA today  Will contact pt in 4 weeks for a telephone encounter/update  Office visit in 2 months  Recheck RNA 12 weeks after completion of treatment May benefit from probiotic: I have put in a request to the medical scientists with Harvoni to check into this. Patient is aware to not take until she hears from [place].

## 2014-04-12 ENCOUNTER — Ambulatory Visit: Payer: Medicare HMO | Admitting: Gastroenterology

## 2014-04-12 LAB — CBC
HCT: 35.6 % — ABNORMAL LOW (ref 36.0–46.0)
Hemoglobin: 11.9 g/dL — ABNORMAL LOW (ref 12.0–15.0)
MCH: 30.6 pg (ref 26.0–34.0)
MCHC: 33.4 g/dL (ref 30.0–36.0)
MCV: 91.5 fL (ref 78.0–100.0)
Platelets: 211 10*3/uL (ref 150–400)
RBC: 3.89 MIL/uL (ref 3.87–5.11)
RDW: 13.9 % (ref 11.5–15.5)
WBC: 7.8 10*3/uL (ref 4.0–10.5)

## 2014-04-13 LAB — BASIC METABOLIC PANEL
BUN: 4 mg/dL — ABNORMAL LOW (ref 6–23)
CO2: 23 mEq/L (ref 19–32)
Calcium: 9.8 mg/dL (ref 8.4–10.5)
Chloride: 108 mEq/L (ref 96–112)
Creat: 0.71 mg/dL (ref 0.50–1.10)
Glucose, Bld: 96 mg/dL (ref 70–99)
Potassium: 4.2 mEq/L (ref 3.5–5.3)
Sodium: 138 mEq/L (ref 135–145)

## 2014-04-13 LAB — HEPATIC FUNCTION PANEL
ALT: <8 U/L (ref 0–35)
AST: 11 U/L (ref 0–37)
Albumin: 4 g/dL (ref 3.5–5.2)
Alkaline Phosphatase: 69 U/L (ref 39–117)
Bilirubin, Direct: 0.1 mg/dL (ref 0.0–0.3)
Indirect Bilirubin: 0.3 mg/dL (ref 0.2–1.2)
Total Bilirubin: 0.4 mg/dL (ref 0.2–1.2)
Total Protein: 7.3 g/dL (ref 6.0–8.3)

## 2014-04-15 LAB — HEPATITIS C RNA QUANTITATIVE: HCV Quantitative: NOT DETECTED IU/mL (ref <15)

## 2014-04-23 ENCOUNTER — Telehealth: Payer: Self-pay | Admitting: Orthopedic Surgery

## 2014-04-23 ENCOUNTER — Other Ambulatory Visit: Payer: Self-pay | Admitting: *Deleted

## 2014-04-23 DIAGNOSIS — M5442 Lumbago with sciatica, left side: Secondary | ICD-10-CM

## 2014-04-23 DIAGNOSIS — M5410 Radiculopathy, site unspecified: Secondary | ICD-10-CM

## 2014-04-23 MED ORDER — HYDROCODONE-ACETAMINOPHEN 5-325 MG PO TABS: 1.0000 | ORAL_TABLET | ORAL | Status: DC | PRN

## 2014-04-23 NOTE — Telephone Encounter (Signed)
ROUTING TO DR HARRISON 

## 2014-04-23 NOTE — Telephone Encounter (Signed)
Patient picked up prescription 04/23/14.  She is asking what about surgery, per 2nd question?

## 2014-04-23 NOTE — Telephone Encounter (Signed)
Prescription available for pickup, patient aware

## 2014-04-23 NOTE — Telephone Encounter (Signed)
Patient called to request (1)refill of pain medication: HYDROcodone-acetaminophen (NORCO/VICODIN) 5-325 MG per tablet                                  And (2)states would like to discuss schedule of knee surgery per last office note 03/27/14.  Please advise if new appointment is needed                                    Her primary insurance is [employer] [facility] Medicare which requires pre-authorization        [name] ph# is [phone].

## 2014-04-23 NOTE — Telephone Encounter (Signed)
Need more information, what surgery?

## 2014-04-24 ENCOUNTER — Other Ambulatory Visit: Payer: Self-pay

## 2014-04-24 ENCOUNTER — Other Ambulatory Visit: Payer: Self-pay | Admitting: Gastroenterology

## 2014-04-24 DIAGNOSIS — D649 Anemia, unspecified: Secondary | ICD-10-CM

## 2014-04-24 NOTE — Progress Notes (Signed)
Quick Note:  Viral load undetectable. GREAT NEWS!! CONTINUE HARVONI AS DIRECTED. DO NOT STOP TAKING. Needs to complete entire course of therapy.  LFTs, renal function normal. HOWEVER: she has had a drop in Hgb to 11.9, which is about a 3 gram drop from 3 months ago. No overt signs of GI bleeding at last appt.  Let's recheck CBC, iron, and ferritin now. Should not be a side effect from harvoni. ______

## 2014-04-24 NOTE — Telephone Encounter (Signed)
Routine followup for medication refill and reassessment of her ongoing problems with her left leg. She is still not ready to have the surgery I think she's afraid.  The above was the only mention of surgery I can find in her chart, she was advised at last appointment to call when ready to schedule surgery. Patient was not clear if it would be arthroscopy or replacement of the left knee but thought maybe replacement, once confirmed, she is ready to schedule anytime after October 1.

## 2014-04-24 NOTE — Telephone Encounter (Signed)
Please schedule appointment for patient to discuss with Dr Aline Brochure

## 2014-04-24 NOTE — Telephone Encounter (Signed)
Surgery for her is not for the knee its for her back   ???  Ill need to see her to discuss

## 2014-04-24 NOTE — Telephone Encounter (Signed)
Called patient to offer appointment; also contacted primary care for referral, per her insurance requirement.

## 2014-04-26 LAB — CBC
HCT: 38.3 % (ref 36.0–46.0)
Hemoglobin: 13.1 g/dL (ref 12.0–15.0)
MCH: 29.9 pg (ref 26.0–34.0)
MCHC: 34.2 g/dL (ref 30.0–36.0)
MCV: 87.4 fL (ref 78.0–100.0)
Platelets: 185 10*3/uL (ref 150–400)
RBC: 4.38 MIL/uL (ref 3.87–5.11)
RDW: 13.9 % (ref 11.5–15.5)
WBC: 6.1 10*3/uL (ref 4.0–10.5)

## 2014-04-26 LAB — FERRITIN: Ferritin: 350 ng/mL — ABNORMAL HIGH (ref 10–291)

## 2014-04-26 LAB — IRON: Iron: 86 ug/dL (ref 42–145)

## 2014-05-06 NOTE — Progress Notes (Signed)
Quick Note:  Hgb 13.1. This is around her baseline. Ferritin mildly elevated, iron normal. No evidence of Dhamar. Will continue to monitor in future. Let's repeat a CBC in 6 weeks. ______

## 2014-05-10 ENCOUNTER — Other Ambulatory Visit: Payer: Self-pay | Admitting: Gastroenterology

## 2014-05-10 DIAGNOSIS — B182 Chronic viral hepatitis C: Secondary | ICD-10-CM

## 2014-05-10 DIAGNOSIS — K7460 Unspecified cirrhosis of liver: Principal | ICD-10-CM

## 2014-05-12 ENCOUNTER — Emergency Department (HOSPITAL_COMMUNITY)
Admission: EM | Admit: 2014-05-12 | Discharge: 2014-05-12 | Disposition: A | Discharge status: Home / Self Care | Payer: Medicare HMO | Attending: Emergency Medicine | Admitting: Emergency Medicine

## 2014-05-12 ENCOUNTER — Encounter (HOSPITAL_COMMUNITY): Payer: Self-pay | Admitting: Emergency Medicine

## 2014-05-12 ENCOUNTER — Emergency Department (HOSPITAL_COMMUNITY): Payer: Medicare HMO

## 2014-05-12 DIAGNOSIS — R51 Headache: Secondary | ICD-10-CM | POA: Diagnosis present

## 2014-05-12 DIAGNOSIS — Z79899 Other long term (current) drug therapy: Secondary | ICD-10-CM | POA: Diagnosis not present

## 2014-05-12 DIAGNOSIS — Z720 Tobacco use: Secondary | ICD-10-CM | POA: Diagnosis not present

## 2014-05-12 DIAGNOSIS — J45909 Unspecified asthma, uncomplicated: Secondary | ICD-10-CM | POA: Insufficient documentation

## 2014-05-12 DIAGNOSIS — I10 Essential (primary) hypertension: Secondary | ICD-10-CM | POA: Insufficient documentation

## 2014-05-12 DIAGNOSIS — Z8619 Personal history of other infectious and parasitic diseases: Secondary | ICD-10-CM | POA: Diagnosis not present

## 2014-05-12 DIAGNOSIS — R519 Headache, unspecified: Secondary | ICD-10-CM

## 2014-05-12 DIAGNOSIS — Z1279 Encounter for screening for malignant neoplasm of other genitourinary organs: Secondary | ICD-10-CM | POA: Insufficient documentation

## 2014-05-12 DIAGNOSIS — Z880 Allergy status to penicillin: Secondary | ICD-10-CM | POA: Diagnosis not present

## 2014-05-12 DIAGNOSIS — Z862 Personal history of diseases of the blood and blood-forming organs and certain disorders involving the immune mechanism: Secondary | ICD-10-CM | POA: Diagnosis not present

## 2014-05-12 MED ORDER — HYDROCODONE-ACETAMINOPHEN 5-325 MG PO TABS: 2.0000 | ORAL_TABLET | ORAL | Status: DC | PRN

## 2014-05-12 MED ORDER — KETOROLAC TROMETHAMINE 30 MG/ML IJ SOLN
30.0000 mg | Freq: Once | INTRAMUSCULAR | Status: AC
  Administered 2014-05-12: 30 mg via INTRAVENOUS
  Filled 2014-05-12: qty 1

## 2014-05-12 MED ORDER — DEXAMETHASONE SODIUM PHOSPHATE 10 MG/ML IJ SOLN
10.0000 mg | Freq: Once | INTRAMUSCULAR | Status: AC
  Administered 2014-05-12: 10 mg via INTRAVENOUS
  Filled 2014-05-12: qty 1

## 2014-05-12 MED ORDER — METOCLOPRAMIDE HCL 5 MG/ML IJ SOLN
10.0000 mg | Freq: Once | INTRAMUSCULAR | Status: AC
  Administered 2014-05-12: 10 mg via INTRAVENOUS
  Filled 2014-05-12: qty 2

## 2014-05-12 MED ORDER — DIPHENHYDRAMINE HCL 50 MG/ML IJ SOLN
25.0000 mg | Freq: Once | INTRAMUSCULAR | Status: AC
  Administered 2014-05-12: 25 mg via INTRAVENOUS
  Filled 2014-05-12: qty 1

## 2014-05-12 MED ORDER — SODIUM CHLORIDE 0.9 % IV BOLUS (SEPSIS)
1000.0000 mL | Freq: Once | INTRAVENOUS | Status: AC
  Administered 2014-05-12: 1000 mL via INTRAVENOUS

## 2014-05-12 NOTE — Discharge Instructions (Signed)
Hydrocodone as needed for pain.  Return to the ER if your symptoms substantially worsen or change.   General Headache Without Cause A headache is pain or discomfort felt around the head or neck area. The specific cause of a headache may not be found. There are many causes and types of headaches. A few common ones are:  Tension headaches.  Migraine headaches.  Cluster headaches.  Chronic daily headaches. HOME CARE INSTRUCTIONS   Keep all follow-up appointments with your caregiver or any specialist referral.  Only take over-the-counter or prescription medicines for pain or discomfort as directed by your caregiver.  Lie down in a dark, quiet room when you have a headache.  Keep a headache journal to find out what may trigger your migraine headaches. For example, write down:  What you eat and drink.  How much sleep you get.  Any change to your diet or medicines.  Try massage or other relaxation techniques.  Put ice packs or heat on the head and neck. Use these 3 to 4 times per day for 15 to 20 minutes each time, or as needed.  Limit stress.  Sit up straight, and do not tense your muscles.  Quit smoking if you smoke.  Limit alcohol use.  Decrease the amount of caffeine you drink, or stop drinking caffeine.  Eat and sleep on a regular schedule.  Get 7 to 9 hours of sleep, or as recommended by your caregiver.  Keep lights dim if bright lights bother you and make your headaches worse. SEEK MEDICAL CARE IF:   You have problems with the medicines you were prescribed.  Your medicines are not working.  You have a change from the usual headache.  You have nausea or vomiting. SEEK IMMEDIATE MEDICAL CARE IF:   Your headache becomes severe.  You have a fever.  You have a stiff neck.  You have loss of vision.  You have muscular weakness or loss of muscle control.  You start losing your balance or have trouble walking.  You feel faint or pass out.  You have  severe symptoms that are different from your first symptoms. MAKE SURE YOU:   Understand these instructions.  Will watch your condition.  Will get help right away if you are not doing well or get worse. Document Released: 07/19/2005 Document Revised: 10/11/2011 Document Reviewed: 08/04/2011 [facility] Patient Information 2015 [place], [place]. This information is not intended to replace advice given to you by your health care provider. Make sure you discuss any questions you have with your health care provider.

## 2014-05-12 NOTE — ED Notes (Addendum)
Pt reports headache, generalized weakness x2 days. Pt reports intermittent blurred vision/dizziness, light sensitivity x1 day. Pt alert and oriented. Airway patent. Speech clear.

## 2014-05-12 NOTE — ED Provider Notes (Signed)
CSN: [id number]     Arrival date & time 05/12/14  1818 History  This chart was scribed for Colleen Speak, MD by Edison Simon, ED Scribe. This patient was seen in room APA07/APA07 and the patient's care was started at 8:02 PM    Chief Complaint  Patient presents with  . Headache   Patient is a 58 y.o. female presenting with headaches. The history is provided by the patient. No language interpreter was used.  Headache Pain location:  Occipital and L temporal Radiates to:  Does not radiate Onset quality:  Sudden Timing:  Constant Chronicity:  New Similar to prior headaches: no   Relieved by:  Nothing Worsened by:  Nothing tried   HPI Comments: Colleen Lee is a 58 y.o. female who presents to the Emergency Department complaining of headache with onset 2 days ago. She reports associated generalized weakness. She denies history of migraines or particular history of headaches. She reports assocaied photophobia, vomiting, and diaphoresis. She denies recent head injury. She denies neck pain or fever.  Past Medical History  Diagnosis Date  . Hypertension   . Fibroids   . [name]     HCV RNA positive 09/2012  . GERD (gastroesophageal reflux disease)   . H. pylori infection 2014    treated with pylera  . Asthma   . PONV (postoperative nausea and vomiting)     pt thinks maybe once she had N&V  . Cirrhosis     Metavir score F4 on elastography 2015   Past Surgical History  Procedure Laterality Date  . Knee surgery      left knee  . Colonoscopy  01/18/2008    YBO:FBPZWC rectum.  Long redundant colon, a diminutive sigmoid polyp status post cold biopsy removed. Hyperplastic polyp. Repeat colonoscopy June 2014 due to family history of colon cancer  . Esophagogastroduodenoscopy  01/18/2008    RMR: Normal esophagus, normal  stomach  . Colonoscopy with esophagogastroduodenoscopy (egd) N/A 11/02/2012    HEN:IDPOEUMP gastric mucosa of doubtful, +H.pylori. Incomplete colonoscopy due to patient  unable to tolerate exam, proximal colon seen. Patient refused ACBE.  . Colonoscopy with propofol N/A 03/08/2013    Dr. Gala Romney: colonic polyp-removed as scribed above. Internal Hemorrhoids. Pathology did not reveal any colonic tissue, only mucus. SURVEILLANCE DUE Aug 2019  . Polypectomy N/A 03/08/2013    Procedure: POLYPECTOMY;  Surgeon: Daneil Dolin, MD;  Location: [facility];  Service: Endoscopy;  Laterality: N/A;  . Multiple extractions with alveoloplasty N/A 10/15/2013    Procedure: MULTIPLE EXTRACTION WITH ALVEOLOPLASTY;  Surgeon: Gae Bon, DDS;  Location: [facility];  Service: Oral Surgery;  Laterality: N/A;   Family History  Problem Relation Age of Onset  . Colon cancer Brother        . Asthma      FH  . Multiple myeloma Brother   . Liver cancer Sister   . Prostate cancer Brother   . Pancreatic cancer Brother   . Cancer Other     niece, age 63, primary unknown   History  Substance Use Topics  . Smoking status: Current Every Day Smoker -- 1.00 packs/day for 40 years    Types: Cigarettes  . Smokeless tobacco: Not on file     Comment: Smokes one pack of cigarettes daily  . Alcohol Use: No     Comment: former, last Jan 2014   OB History   Grav Para Term Preterm Abortions TAB SAB Ect Mult Living  Review of Systems  Neurological: Positive for headaches.  All other systems reviewed and are negative. A complete 10 system review of systems was obtained and all systems are negative except as noted in the HPI and PMH.    Allergies  Penicillins  Home Medications   Prior to Admission medications   Medication Sig Start Date End Date Taking? Authorizing Provider  Cyanocobalamin (VITAMIN B 12 PO) Take 1 tablet by mouth daily.    Historical Provider, MD  HYDROcodone-acetaminophen (NORCO/VICODIN) 5-325 MG per tablet Take 1 tablet by mouth every 4 (four) hours as needed for moderate pain. 04/23/14   Carole Civil, MD  ibuprofen (ADVIL,MOTRIN) 800 MG tablet Take 1  tablet (800 mg total) by mouth every 8 (eight) hours as needed. 04/05/14   Carole Civil, MD  Ledipasvir-Sofosbuvir (HARVONI) 90-400 MG TABS Take 1 capsule by mouth daily. 03/05/14   Orvil Feil, NP  Linaclotide Rolan Lipa) 290 MCG CAPS capsule Take 1 capsule (290 mcg total) by mouth daily. 12/06/13   Orvil Feil, NP  lisinopril (PRINIVIL,ZESTRIL) 20 MG tablet Take 20 mg by mouth daily.    Historical Provider, MD  megestrol (MEGACE) 40 MG/ML suspension Take 200 mg by mouth daily.    Historical Provider, MD  potassium chloride SA (K-DUR,KLOR-CON) 20 MEQ tablet Take 20 mEq by mouth daily.    Historical Provider, MD   BP 173/86  Pulse 61  Temp(Src) 97.8 F (36.6 C) (Oral)  Resp 20  Ht $R'5\' 6"'kN$  (1.676 m)  Wt 140 lb (63.504 kg)  BMI 22.61 kg/m2  SpO2 100% Physical Exam  Nursing note and vitals reviewed. Constitutional: She is oriented to person, place, and time. She appears well-developed and well-nourished.  HENT:  Head: Normocephalic and atraumatic.  Eyes: Conjunctivae and EOM are normal. Pupils are equal, round, and reactive to light.  No papilledema  Neck: Normal range of motion. Neck supple.  Cardiovascular: Normal rate, regular rhythm and normal heart sounds.   No murmur heard. Pulmonary/Chest: Effort normal and breath sounds normal. No respiratory distress. She has no wheezes. She has no rales.  Musculoskeletal: Normal range of motion.  Neurological: She is alert and oriented to person, place, and time. No cranial nerve deficit. She exhibits normal muscle tone. Coordination normal.  Skin: Skin is warm and dry.  Psychiatric: She has a normal mood and affect.    ED Course  Procedures (including critical care time) Labs Review Labs Reviewed - No data to display  Imaging Review No results found.   EKG Interpretation None     DIAGNOSTIC STUDIES: Oxygen Saturation is 100% on room air, normal by my interpretation.    COORDINATION OF CARE: 8:06 PM Discussed treatment plan with  patient at beside, including medication for migraines. I will also order a CT scan. The patient agrees with the plan and has no further questions at this time.    MDM   Final diagnoses:  None    Patient presents with complaints of headache. Her neurologic exam is nonfocal, there is no nuchal rigidity or fever, and she is feeling better with medications given in the ER. She will be discharged to home, to return as needed for any problems.   I personally performed the services described in this documentation, which was scribed in my presence. The recorded information has been reviewed and is accurate.      Colleen Speak, MD 05/12/14 2144

## 2014-05-12 NOTE — ED Notes (Signed)
Kieth Brightly, RN at bedside attempting to gain IV access.

## 2014-05-12 NOTE — ED Notes (Signed)
Dr. Delo at bedside. 

## 2014-05-15 ENCOUNTER — Other Ambulatory Visit: Payer: Self-pay

## 2014-05-15 DIAGNOSIS — K7460 Unspecified cirrhosis of liver: Principal | ICD-10-CM

## 2014-05-15 DIAGNOSIS — B182 Chronic viral hepatitis C: Secondary | ICD-10-CM

## 2014-05-23 ENCOUNTER — Encounter: Payer: Self-pay | Admitting: Orthopedic Surgery

## 2014-05-23 ENCOUNTER — Ambulatory Visit (INDEPENDENT_AMBULATORY_CARE_PROVIDER_SITE_OTHER): Payer: Medicare HMO | Admitting: Orthopedic Surgery

## 2014-05-23 VITALS — BP 168/92 | Ht 66.0 in | Wt 143.6 lb

## 2014-05-23 DIAGNOSIS — Z9889 Other specified postprocedural states: Secondary | ICD-10-CM

## 2014-05-23 DIAGNOSIS — M5442 Lumbago with sciatica, left side: Secondary | ICD-10-CM

## 2014-05-23 DIAGNOSIS — M5410 Radiculopathy, site unspecified: Secondary | ICD-10-CM

## 2014-05-23 DIAGNOSIS — M5432 Sciatica, left side: Secondary | ICD-10-CM

## 2014-05-23 DIAGNOSIS — S82142P Displaced bicondylar fracture of left tibia, subsequent encounter for closed fracture with malunion: Secondary | ICD-10-CM

## 2014-05-23 MED ORDER — HYDROCODONE-ACETAMINOPHEN 5-325 MG PO TABS: 1.0000 | ORAL_TABLET | ORAL | Status: DC | PRN

## 2014-05-23 NOTE — Patient Instructions (Signed)
Will refer to neurosurgery for back

## 2014-05-23 NOTE — Progress Notes (Signed)
Chief Complaint  Patient presents with  . Follow-up    recheck back/knee, discuss surgery    The patient presents back to discuss surgery on her lumbar spine and she's ready. She's also complaining swelling and pain in her left knee status post tibial plateau fracture status post knee arthroscopy for meniscal tear  Review of systems the back pain is associated with leg pain again she is ready for knee surgery  Swelling of the left leg when the knee is swollen  BP 168/92  Ht 5\' 6"  (1.676 m)  Wt 143 lb 9.6 oz (65.137 kg)  BMI 23.19 kg/m2 Normal development grooming and hygiene mood and affect normal ambulates without assistive device has a varus alignment to the knee Small joint effusion Painful range of motion 0 125 with no laxity in the joint and the collateral plane muscle tone strength normal skin intact pulses normal  Procedure note left knee injection verbal consent was obtained to inject left knee joint  Timeout was completed to confirm the site of injection  The medications used were 40 mg of Depo-Medrol and 1% lidocaine 3 cc  Anesthesia was provided by ethyl chloride and the skin was prepped with alcohol.  After cleaning the skin with alcohol a 20-gauge needle was used to inject the left knee joint. There were no complications. A sterile bandage was applied.   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 HYDROcodone-acetaminophen (NORCO/VICODIN) 5-325 MG per tablet    Sig: Take 1 tablet by mouth every 4 (four) hours as needed for moderate pain.    Dispense:  180 tablet    Refill:  0    MRI  IMPRESSION:   1.  Mild degenerative disc disease without central canal or foraminal stenosis. 2.  Decreased marrow signal is nonspecific but could be due to cigarette smoking.  No focal marrow lesion is  identified.

## 2014-05-24 ENCOUNTER — Telehealth: Payer: Self-pay | Admitting: *Deleted

## 2014-05-24 NOTE — Telephone Encounter (Signed)
DR Aline Brochure RECOMMENDS REFERRAL TO NEUROSURGERY PATIENTS INSURANCE REQUIRES REFERRAL FROM HER PCP ALL NOTES FAXED TO DR Legrand Rams TO COMPLETE REFERRAL PROCESS

## 2014-06-10 ENCOUNTER — Telehealth: Payer: Self-pay | Admitting: *Deleted

## 2014-06-10 ENCOUNTER — Encounter: Payer: Self-pay | Admitting: *Deleted

## 2014-06-10 NOTE — Telephone Encounter (Signed)
DR Aline Brochure RECOMMENDS REFERRAL TO NEUROSURGERY PATIENTS INSURANCE REQUIRES REFERRAL FROM PCP NOTES SENT TO PCP 05/24/14 WITH RECOMMENDATION

## 2014-06-11 ENCOUNTER — Ambulatory Visit: Payer: Medicare HMO | Admitting: Gastroenterology

## 2014-06-18 ENCOUNTER — Telehealth: Payer: Self-pay | Admitting: Orthopedic Surgery

## 2014-06-18 ENCOUNTER — Other Ambulatory Visit: Payer: Self-pay | Admitting: *Deleted

## 2014-06-18 DIAGNOSIS — M5410 Radiculopathy, site unspecified: Secondary | ICD-10-CM

## 2014-06-18 DIAGNOSIS — M5442 Lumbago with sciatica, left side: Secondary | ICD-10-CM

## 2014-06-18 MED ORDER — HYDROCODONE-ACETAMINOPHEN 5-325 MG PO TABS: 1.0000 | ORAL_TABLET | ORAL | Status: DC | PRN

## 2014-06-18 NOTE — Telephone Encounter (Signed)
Prescription available, patient aware  

## 2014-06-18 NOTE — Telephone Encounter (Signed)
Patient called to request pain medication refill: HYDROcodone-acetaminophen (NORCO/VICODIN) 5-325 MG per tablet [[id number]

## 2014-06-19 NOTE — Telephone Encounter (Signed)
Patient picked up prescription.

## 2014-07-01 DIAGNOSIS — M5440 Lumbago with sciatica, unspecified side: Secondary | ICD-10-CM | POA: Insufficient documentation

## 2014-07-11 ENCOUNTER — Other Ambulatory Visit: Payer: Self-pay | Admitting: Neurosurgery

## 2014-07-11 DIAGNOSIS — M5440 Lumbago with sciatica, unspecified side: Secondary | ICD-10-CM

## 2014-07-16 ENCOUNTER — Encounter: Payer: Self-pay | Admitting: Gastroenterology

## 2014-07-16 ENCOUNTER — Telehealth: Payer: Self-pay | Admitting: Orthopedic Surgery

## 2014-07-16 ENCOUNTER — Ambulatory Visit (INDEPENDENT_AMBULATORY_CARE_PROVIDER_SITE_OTHER): Payer: Commercial Managed Care - HMO | Admitting: Gastroenterology

## 2014-07-16 ENCOUNTER — Other Ambulatory Visit: Payer: Self-pay | Admitting: *Deleted

## 2014-07-16 VITALS — BP 149/81 | HR 64 | Temp 98.2°F | Ht 66.0 in | Wt 152.6 lb

## 2014-07-16 DIAGNOSIS — K7460 Unspecified cirrhosis of liver: Principal | ICD-10-CM

## 2014-07-16 DIAGNOSIS — M5410 Radiculopathy, site unspecified: Secondary | ICD-10-CM

## 2014-07-16 DIAGNOSIS — K5900 Constipation, unspecified: Secondary | ICD-10-CM

## 2014-07-16 DIAGNOSIS — B182 Chronic viral hepatitis C: Secondary | ICD-10-CM

## 2014-07-16 DIAGNOSIS — M5442 Lumbago with sciatica, left side: Secondary | ICD-10-CM

## 2014-07-16 MED ORDER — HYDROCODONE-ACETAMINOPHEN 5-325 MG PO TABS: 1.0000 | ORAL_TABLET | ORAL | Status: DC | PRN

## 2014-07-16 NOTE — Telephone Encounter (Signed)
Patient picked up Rx

## 2014-07-16 NOTE — Progress Notes (Signed)
Referring Provider: Rosita Fire, MD Primary Care Physician:  Rosita Fire, MD  Primary GI: Dr. Gala Romney   Chief Complaint  Patient presents with  . Follow-up    HPI:   Presents today in follow-up with history of chronic Hep C, constipation. Genotype 1a. Started on Harvoni Mar 13, 2014. Finished 12 week course. Needs final HCV RNA Feb 2016. Not taking Linzess every day. States it "knots her stomach up". Amitiza did not work. Taking milk of magnesia for constipation. Sometimes goes on own. Thinks her food has something to do with it. Loves mac n cheese.     Past Medical History  Diagnosis Date  . Hypertension   . Fibroids   . [name]     HCV RNA positive 09/2012  . GERD (gastroesophageal reflux disease)   . H. pylori infection 2014    treated with pylera  . Asthma   . PONV (postoperative nausea and vomiting)     pt thinks maybe once she had N&V  . Cirrhosis     Metavir score F4 on elastography 2015    Past Surgical History  Procedure Laterality Date  . Knee surgery      left knee  . Colonoscopy  01/18/2008    FAO:ZHYQMV rectum.  Long redundant colon, a diminutive sigmoid polyp status post cold biopsy removed. Hyperplastic polyp. Repeat colonoscopy June 2014 due to family history of colon cancer  . Esophagogastroduodenoscopy  01/18/2008    RMR: Normal esophagus, normal  stomach  . Colonoscopy with esophagogastroduodenoscopy (egd) N/A 11/02/2012    HQI:ONGEXBMW gastric mucosa of doubtful, +H.pylori. Incomplete colonoscopy due to patient unable to tolerate exam, proximal colon seen. Patient refused ACBE.  . Colonoscopy with propofol N/A 03/08/2013    Dr. Gala Romney: colonic polyp-removed as scribed above. Internal Hemorrhoids. Pathology did not reveal any colonic tissue, only mucus. SURVEILLANCE DUE Aug 2019  . Polypectomy N/A 03/08/2013    Procedure: POLYPECTOMY;  Surgeon: Daneil Dolin, MD;  Location: [facility];  Service: Endoscopy;  Laterality: N/A;  . Multiple extractions with  alveoloplasty N/A 10/15/2013    Procedure: MULTIPLE EXTRACTION WITH ALVEOLOPLASTY;  Surgeon: Gae Bon, DDS;  Location: [facility];  Service: Oral Surgery;  Laterality: N/A;    Current Outpatient Prescriptions  Medication Sig Dispense Refill  . Cyanocobalamin (VITAMIN B 12 PO) Take 1 tablet by mouth daily.    Marland Kitchen HYDROcodone-acetaminophen (NORCO/VICODIN) 5-325 MG per tablet Take 1 tablet by mouth every 4 (four) hours as needed for moderate pain. 180 tablet 0  . ibuprofen (ADVIL,MOTRIN) 800 MG tablet Take 1 tablet (800 mg total) by mouth every 8 (eight) hours as needed. 90 tablet 5  . Linaclotide (LINZESS) 290 MCG CAPS capsule Take 1 capsule (290 mcg total) by mouth daily. 30 capsule 11  . lisinopril (PRINIVIL,ZESTRIL) 20 MG tablet Take 20 mg by mouth daily.    . Megestrol Acetate (MEGACE ORAL PO) Take 1 tablet by mouth daily.    . potassium chloride SA (K-DUR,KLOR-CON) 20 MEQ tablet Take 20 mEq by mouth daily.     No current facility-administered medications for this visit.    Allergies as of 07/16/2014 - Review Complete 07/16/2014  Allergen Reaction Noted  . Penicillins Rash     Family History  Problem Relation Age of Onset  . Colon cancer Brother        . Asthma      FH  . Multiple myeloma Brother   . Liver cancer Sister   . Prostate cancer Brother   .  Pancreatic cancer Brother   . Cancer Other     niece, age 74, primary unknown    History   Social History  . Marital Status: Married    Spouse Name: N/A    Number of Children: 0  . Years of Education: N/A   Occupational History  . umemployed     Social History Main Topics  . Smoking status: Current Every Day Smoker -- 1.00 packs/day for 40 years    Types: Cigarettes  . Smokeless tobacco: None     Comment: Smokes one pack of cigarettes daily  . Alcohol Use: No     Comment: former, last Jan 2014  . Drug Use: No     Comment: history of marijuana in past  . Sexual Activity: No   Other Topics Concern  . None    Social History Narrative    Review of Systems: Negative unless mentioned in HPI  Physical Exam: BP 149/81 mmHg  Pulse 64  Temp(Src) 98.2 F (36.8 C) (Oral)  Ht $R'5\' 6"'nV$  (1.676 m)  Wt 152 lb 9.6 oz (69.219 kg)  BMI 24.64 kg/m2 General:   Alert and oriented. No distress noted. Pleasant and cooperative.  Head:  Normocephalic and atraumatic. Eyes:  Conjuctiva clear without scleral icterus. Mouth:  Oral mucosa pink and moist. Good dentition. No lesions. Heart:  S1, S2 present without murmurs, rubs, or gallops. Regular rate and rhythm. Abdomen:  +BS, soft, non-tender and non-distended. No rebound or guarding. No HSM or masses noted. Msk:  Symmetrical without gross deformities. Normal posture. Extremities:  Without edema. Neurologic:  Alert and  oriented x4;  grossly normal neurologically. Skin:  Intact without significant lesions or rashes. Psych:  Alert and cooperative. Normal mood and affect.

## 2014-07-16 NOTE — Patient Instructions (Addendum)
Please complete the blood work in February. We will mail it to you.  We will also arrange an ultrasound of your liver in February.   Follow the high fiber diet attached.   Merry Christmas, and we will see you in 6 months!  High-Fiber Diet Fiber is found in fruits, vegetables, and grains. A high-fiber diet encourages the addition of more whole grains, legumes, fruits, and vegetables in your diet. The recommended amount of fiber for adult males is 38 g per day. For adult females, it is 25 g per day. Pregnant and lactating women should get 28 g of fiber per day. If you have a digestive or bowel problem, ask your caregiver for advice before adding high-fiber foods to your diet. Eat a variety of high-fiber foods instead of only a select few type of foods.  PURPOSE  To increase stool bulk.  To make bowel movements more regular to prevent constipation.  To lower cholesterol.  To prevent overeating. WHEN IS THIS DIET USED?  It may be used if you have constipation and hemorrhoids.  It may be used if you have uncomplicated diverticulosis (intestine condition) and irritable bowel syndrome.  It may be used if you need help with weight management.  It may be used if you want to add it to your diet as a protective measure against atherosclerosis, diabetes, and cancer. SOURCES OF FIBER  Whole-grain breads and cereals.  Fruits, such as apples, oranges, bananas, berries, prunes, and pears.  Vegetables, such as green peas, carrots, sweet potatoes, beets, broccoli, cabbage, spinach, and artichokes.  Legumes, such split peas, soy, lentils.  Almonds. FIBER CONTENT IN FOODS Starches and Grains / Dietary Fiber (g)  Cheerios, 1 cup / 3 g  Corn Flakes cereal, 1 cup / 0.7 g  Rice crispy treat cereal, 1 cup / 0.3 g  Instant oatmeal (cooked),  cup / 2 g  Frosted wheat cereal, 1 cup / 5.1 g  Brown, long-grain rice (cooked), 1 cup / 3.5 g  White, long-grain rice (cooked), 1 cup / 0.6  g  Enriched macaroni (cooked), 1 cup / 2.5 g Legumes / Dietary Fiber (g)  Baked beans (canned, plain, or vegetarian),  cup / 5.2 g  Kidney beans (canned),  cup / 6.8 g  Pinto beans (cooked),  cup / 5.5 g Breads and Crackers / Dietary Fiber (g)  Plain or honey graham crackers, 2 squares / 0.7 g  Saltine crackers, 3 squares / 0.3 g  Plain, salted pretzels, 10 pieces / 1.8 g  Whole-wheat bread, 1 slice / 1.9 g  White bread, 1 slice / 0.7 g  Raisin bread, 1 slice / 1.2 g  Plain bagel, 3 oz / 2 g  Flour tortilla, 1 oz / 0.9 g  Corn tortilla, 1 small / 1.5 g  Hamburger or hotdog bun, 1 small / 0.9 g Fruits / Dietary Fiber (g)  Apple with skin, 1 medium / 4.4 g  Sweetened applesauce,  cup / 1.5 g  Banana,  medium / 1.5 g  Grapes, 10 grapes / 0.4 g  Orange, 1 small / 2.3 g  Raisin, 1.5 oz / 1.6 g  Melon, 1 cup / 1.4 g Vegetables / Dietary Fiber (g)  Green beans (canned),  cup / 1.3 g  Carrots (cooked),  cup / 2.3 g  Broccoli (cooked),  cup / 2.8 g  Peas (cooked),  cup / 4.4 g  Mashed potatoes,  cup / 1.6 g  Lettuce, 1 cup / 0.5 g  Corn (canned),  cup / 1.6 g  Tomato,  cup / 1.1 g Document Released: 07/19/2005 Document Revised: 01/18/2012 Document Reviewed: 10/21/2011 [facility] Patient Information 2015 [place], [place]. This information is not intended to replace advice given to you by your health care provider. Make sure you discuss any questions you have with your health care provider.

## 2014-07-16 NOTE — Telephone Encounter (Signed)
Prescription available, patient aware  

## 2014-07-16 NOTE — Telephone Encounter (Signed)
Patient is calling asking for a refill on pain medication HYDROcodone-acetaminophen (NORCO/VICODIN) 5-325 MG per tablet please advise? 

## 2014-07-22 ENCOUNTER — Other Ambulatory Visit: Payer: Medicare HMO

## 2014-07-22 NOTE — Assessment & Plan Note (Signed)
Follow high fiber diet. No improvement with Amitiza, and Linzess causes cramping. May take milk of magnesia or Miralax as needed. Otherwise, follow high fiber diet, increase water intake.

## 2014-07-22 NOTE — Assessment & Plan Note (Signed)
Completed full treatment of Harvoni. Will need repeat HCV RNA in Feb 2016, which we will send to her. [place] with elastography in Feb 2016. Return in 6 months. Tolerated treatment well with excellent improvement in overall quality of life per patient.

## 2014-08-06 NOTE — Progress Notes (Signed)
cc'ed to pcp °

## 2014-08-12 ENCOUNTER — Telehealth: Payer: Self-pay | Admitting: Orthopedic Surgery

## 2014-08-12 ENCOUNTER — Other Ambulatory Visit: Payer: Self-pay | Admitting: *Deleted

## 2014-08-12 DIAGNOSIS — M5410 Radiculopathy, site unspecified: Secondary | ICD-10-CM

## 2014-08-12 DIAGNOSIS — M5442 Lumbago with sciatica, left side: Secondary | ICD-10-CM

## 2014-08-12 DIAGNOSIS — Z0000 Encounter for general adult medical examination without abnormal findings: Secondary | ICD-10-CM | POA: Diagnosis not present

## 2014-08-12 MED ORDER — HYDROCODONE-ACETAMINOPHEN 5-325 MG PO TABS: 1.0000 | ORAL_TABLET | ORAL | Status: DC | PRN

## 2014-08-12 NOTE — Telephone Encounter (Signed)
Call from patient, requesting refill of pain medication, Hydrocodone-Norco/Vicodin 5-325.  Please advise.  Her ph# is [phone].

## 2014-08-13 NOTE — Telephone Encounter (Signed)
Spoke with patient/prescription pick up today, 08/13/14.

## 2014-08-13 NOTE — Telephone Encounter (Signed)
Prescription available for pick up, called patient, no answer 

## 2014-08-14 ENCOUNTER — Encounter (HOSPITAL_COMMUNITY): Payer: Self-pay | Admitting: *Deleted

## 2014-08-14 ENCOUNTER — Emergency Department (HOSPITAL_COMMUNITY)
Admission: EM | Admit: 2014-08-14 | Discharge: 2014-08-14 | Disposition: A | Discharge status: Home / Self Care | Payer: Commercial Managed Care - HMO | Attending: Emergency Medicine | Admitting: Emergency Medicine

## 2014-08-14 ENCOUNTER — Other Ambulatory Visit (HOSPITAL_COMMUNITY): Payer: Self-pay | Admitting: Internal Medicine

## 2014-08-14 DIAGNOSIS — I10 Essential (primary) hypertension: Secondary | ICD-10-CM

## 2014-08-14 DIAGNOSIS — F419 Anxiety disorder, unspecified: Secondary | ICD-10-CM | POA: Insufficient documentation

## 2014-08-14 DIAGNOSIS — Z8619 Personal history of other infectious and parasitic diseases: Secondary | ICD-10-CM | POA: Insufficient documentation

## 2014-08-14 DIAGNOSIS — R109 Unspecified abdominal pain: Secondary | ICD-10-CM | POA: Diagnosis not present

## 2014-08-14 DIAGNOSIS — Z720 Tobacco use: Secondary | ICD-10-CM | POA: Insufficient documentation

## 2014-08-14 DIAGNOSIS — Z8742 Personal history of other diseases of the female genital tract: Secondary | ICD-10-CM | POA: Diagnosis not present

## 2014-08-14 DIAGNOSIS — Z79899 Other long term (current) drug therapy: Secondary | ICD-10-CM | POA: Diagnosis not present

## 2014-08-14 DIAGNOSIS — Z1231 Encounter for screening mammogram for malignant neoplasm of breast: Secondary | ICD-10-CM

## 2014-08-14 DIAGNOSIS — J45909 Unspecified asthma, uncomplicated: Secondary | ICD-10-CM | POA: Insufficient documentation

## 2014-08-14 DIAGNOSIS — R1110 Vomiting, unspecified: Secondary | ICD-10-CM | POA: Diagnosis present

## 2014-08-14 DIAGNOSIS — Z8719 Personal history of other diseases of the digestive system: Secondary | ICD-10-CM | POA: Insufficient documentation

## 2014-08-14 DIAGNOSIS — G4489 Other headache syndrome: Secondary | ICD-10-CM | POA: Diagnosis not present

## 2014-08-14 DIAGNOSIS — Z880 Allergy status to penicillin: Secondary | ICD-10-CM | POA: Insufficient documentation

## 2014-08-14 LAB — CBC WITH DIFFERENTIAL/PLATELET
Basophils Absolute: 0 10*3/uL (ref 0.0–0.1)
Basophils Relative: 0 % (ref 0–1)
Eosinophils Absolute: 0 10*3/uL (ref 0.0–0.7)
Eosinophils Relative: 0 % (ref 0–5)
HCT: 48.7 % — ABNORMAL HIGH (ref 36.0–46.0)
Hemoglobin: 16.8 g/dL — ABNORMAL HIGH (ref 12.0–15.0)
Lymphocytes Relative: 10 % — ABNORMAL LOW (ref 12–46)
Lymphs Abs: 0.7 10*3/uL (ref 0.7–4.0)
MCH: 31.7 pg (ref 26.0–34.0)
MCHC: 34.5 g/dL (ref 30.0–36.0)
MCV: 91.9 fL (ref 78.0–100.0)
Monocytes Absolute: 0.2 10*3/uL (ref 0.1–1.0)
Monocytes Relative: 4 % (ref 3–12)
Neutro Abs: 5.8 10*3/uL (ref 1.7–7.7)
Neutrophils Relative %: 86 % — ABNORMAL HIGH (ref 43–77)
Platelets: 129 10*3/uL — ABNORMAL LOW (ref 150–400)
RBC: 5.3 MIL/uL — ABNORMAL HIGH (ref 3.87–5.11)
RDW: 13.7 % (ref 11.5–15.5)
WBC: 6.8 10*3/uL (ref 4.0–10.5)

## 2014-08-14 LAB — COMPREHENSIVE METABOLIC PANEL
ALT: 12 U/L (ref 0–35)
AST: 16 U/L (ref 0–37)
Albumin: 4.9 g/dL (ref 3.5–5.2)
Alkaline Phosphatase: 79 U/L (ref 39–117)
Anion gap: 10 (ref 5–15)
BUN: 8 mg/dL (ref 6–23)
CO2: 19 mmol/L (ref 19–32)
Calcium: 10.2 mg/dL (ref 8.4–10.5)
Chloride: 110 mEq/L (ref 96–112)
Creatinine, Ser: 0.72 mg/dL (ref 0.50–1.10)
GFR calc Af Amer: >90 mL/min (ref >90)
GFR calc non Af Amer: >90 mL/min (ref >90)
Glucose, Bld: 101 mg/dL — ABNORMAL HIGH (ref 70–99)
Potassium: 3.9 mmol/L (ref 3.5–5.1)
Sodium: 139 mmol/L (ref 135–145)
Total Bilirubin: 1 mg/dL (ref 0.3–1.2)
Total Protein: 8.8 g/dL — ABNORMAL HIGH (ref 6.0–8.3)

## 2014-08-14 LAB — URINALYSIS, ROUTINE W REFLEX MICROSCOPIC
Bilirubin Urine: NEGATIVE
Glucose, UA: NEGATIVE mg/dL
Hgb urine dipstick: NEGATIVE
Ketones, ur: NEGATIVE mg/dL
Leukocytes, UA: NEGATIVE
Nitrite: NEGATIVE
Protein, ur: 30 mg/dL — AB
Specific Gravity, Urine: 1.02 (ref 1.005–1.030)
Urobilinogen, UA: 0.2 mg/dL (ref 0.0–1.0)
pH: 7.5 (ref 5.0–8.0)

## 2014-08-14 LAB — URINE MICROSCOPIC-ADD ON

## 2014-08-14 LAB — LIPASE, BLOOD: Lipase: 20 U/L (ref 11–59)

## 2014-08-14 MED ORDER — METOCLOPRAMIDE HCL 5 MG/ML IJ SOLN
10.0000 mg | Freq: Once | INTRAMUSCULAR | Status: AC
  Administered 2014-08-14: 10 mg via INTRAMUSCULAR
  Filled 2014-08-14: qty 2

## 2014-08-14 MED ORDER — DIPHENHYDRAMINE HCL 50 MG/ML IJ SOLN
50.0000 mg | Freq: Once | INTRAMUSCULAR | Status: AC
  Administered 2014-08-14: 50 mg via INTRAMUSCULAR
  Filled 2014-08-14: qty 1

## 2014-08-14 MED ORDER — OXYCODONE-ACETAMINOPHEN 5-325 MG PO TABS
1.0000 | ORAL_TABLET | Freq: Once | ORAL | Status: AC
  Administered 2014-08-14: 1 via ORAL
  Filled 2014-08-14: qty 1

## 2014-08-14 MED ORDER — IBUPROFEN 800 MG PO TABS
800.0000 mg | ORAL_TABLET | Freq: Once | ORAL | Status: AC
  Administered 2014-08-14: 800 mg via ORAL
  Filled 2014-08-14: qty 1

## 2014-08-14 MED ORDER — DIPHENHYDRAMINE HCL 50 MG/ML IJ SOLN
25.0000 mg | Freq: Once | INTRAMUSCULAR | Status: AC
  Administered 2014-08-14: 25 mg via INTRAMUSCULAR
  Filled 2014-08-14: qty 1

## 2014-08-14 MED ORDER — ONDANSETRON HCL 8 MG PO TABS: 8.0000 mg | ORAL_TABLET | Freq: Three times a day (TID) | ORAL | Status: DC | PRN

## 2014-08-14 MED ORDER — ONDANSETRON 8 MG PO TBDP
8.0000 mg | ORAL_TABLET | Freq: Once | ORAL | Status: AC
  Administered 2014-08-14: 8 mg via ORAL
  Filled 2014-08-14: qty 1

## 2014-08-14 NOTE — Discharge Instructions (Signed)

## 2014-08-14 NOTE — ED Provider Notes (Signed)
CSN: [id number]     Arrival date & time 08/14/14  1138 History  This chart was scribed for Sharyon Cable, MD by Edison Simon, ED Scribe. This patient was seen in room APA04/APA04 and the patient's care was started at 12:20 PM.    Chief Complaint  Patient presents with  . Emesis   Patient is a 59 y.o. female presenting with vomiting. The history is provided by the patient. No language interpreter was used.  Emesis Severity:  Mild Duration:  2 days Timing:  Constant Quality:  Unable to specify Progression:  Worsening Chronicity:  Recurrent Recent urination:  Normal Context: not post-tussive and not self-induced   Relieved by:  Nothing Worsened by:  Nothing tried Ineffective treatments: nausea medication. Associated symptoms: abdominal pain and headaches   Associated symptoms: no diarrhea   Headaches:    Severity:  Mild   Chronicity:  Recurrent   HPI Comments: Colleen Lee is a 59 y.o. female who presents to the Emergency Department complaining of vomiting with onset yesterday. She states she first started feeling nauseas then used nausea medication, but still vomited. She reports associated abdominal and headache. She states headache is similar to prior migraines, with which she has had vomiting. She denies diarrhea, fever, or chest pain.  Past Medical History  Diagnosis Date  . Hypertension   . Fibroids   . [name]     HCV RNA positive 09/2012  . GERD (gastroesophageal reflux disease)   . H. pylori infection 2014    treated with pylera  . Asthma   . PONV (postoperative nausea and vomiting)     pt thinks maybe once she had N&V  . Cirrhosis     Metavir score F4 on elastography 2015   Past Surgical History  Procedure Laterality Date  . Knee surgery      left knee  . Colonoscopy  01/18/2008    DJT:[id number] rectum.  Long redundant colon, a diminutive sigmoid polyp status post cold biopsy removed. Hyperplastic polyp. Repeat colonoscopy June 2014 due to family history  of colon cancer  . Esophagogastroduodenoscopy  01/18/2008    RMR: Normal esophagus, normal  stomach  . Colonoscopy with esophagogastroduodenoscopy (egd) N/A 11/02/2012    TJQ:[id number] gastric mucosa of doubtful, +H.pylori. Incomplete colonoscopy due to patient unable to tolerate exam, proximal colon seen. Patient refused ACBE.  . Colonoscopy with propofol N/A 03/08/2013    Dr. Gala Romney: colonic polyp-removed as scribed above. Internal Hemorrhoids. Pathology did not reveal any colonic tissue, only mucus. SURVEILLANCE DUE Aug 2019  . Polypectomy N/A 03/08/2013    Procedure: POLYPECTOMY;  Surgeon: Daneil Dolin, MD;  Location: [facility];  Service: Endoscopy;  Laterality: N/A;  . Multiple extractions with alveoloplasty N/A 10/15/2013    Procedure: MULTIPLE EXTRACTION WITH ALVEOLOPLASTY;  Surgeon: Gae Bon, DDS;  Location: [facility];  Service: Oral Surgery;  Laterality: N/A;   Family History  Problem Relation Age of Onset  . Colon cancer Brother        . Asthma      FH  . Multiple myeloma Brother   . Liver cancer Sister   . Prostate cancer Brother   . Pancreatic cancer Brother   . Cancer Other     niece, age 27, primary unknown   History  Substance Use Topics  . Smoking status: Current Every Day Smoker -- 1.00 packs/day for 40 years    Types: Cigarettes  . Smokeless tobacco: Not on file     Comment: Smokes  one pack of cigarettes daily  . Alcohol Use: No     Comment: former, last Jan 2014   OB History    No data available     Review of Systems  Constitutional: Negative for fever.  Cardiovascular: Negative for chest pain.  Gastrointestinal: Positive for nausea, vomiting and abdominal pain. Negative for diarrhea.  Neurological: Positive for headaches.  All other systems reviewed and are negative.     Allergies  Penicillins  Home Medications   Prior to Admission medications   Medication Sig Start Date End Date Taking? Authorizing Provider  Cyanocobalamin (VITAMIN B 12 PO) Take 1  tablet by mouth daily.    Historical Provider, MD  HYDROcodone-acetaminophen (NORCO/VICODIN) 5-325 MG per tablet Take 1 tablet by mouth every 4 (four) hours as needed for moderate pain. 08/12/14   Carole Civil, MD  ibuprofen (ADVIL,MOTRIN) 800 MG tablet Take 1 tablet (800 mg total) by mouth every 8 (eight) hours as needed. 04/05/14   Carole Civil, MD  Linaclotide [facility]) 290 MCG CAPS capsule Take 1 capsule (290 mcg total) by mouth daily. 12/06/13   Orvil Feil, NP  lisinopril (PRINIVIL,ZESTRIL) 20 MG tablet Take 20 mg by mouth daily.    Historical Provider, MD  Megestrol Acetate (MEGACE ORAL PO) Take 1 tablet by mouth daily.    Historical Provider, MD  potassium chloride SA (K-DUR,KLOR-CON) 20 MEQ tablet Take 20 mEq by mouth daily.    Historical Provider, MD   BP 200/108 mmHg  Pulse 72  Temp(Src) 98.1 F (36.7 C) (Oral)  Resp 16  Ht $R'5\' 3"'qW$  (1.6 m)  Wt 152 lb (68.947 kg)  BMI 26.93 kg/m2  SpO2 99% Physical Exam  Nursing note and vitals reviewed.   CONSTITUTIONAL: Well developed/well nourished, anxious HEAD: Normocephalic/atraumatic EYES: EOMI/PERRL ENMT: Mucous membranes moist NECK: supple no meningeal signs SPINE/BACK:entire spine nontender CV: S1/S2 noted, no murmurs/rubs/gallops noted LUNGS: Lungs are clear to auscultation bilaterally, no apparent distress ABDOMEN: soft, nontender, no rebound or guarding, bowel sounds noted throughout abdomen GU:no cva tenderness NEURO: Pt is awake/alert/appropriate, moves all extremitiesx4.  No facial droop.  No arm or leg drift EXTREMITIES: pulses normal/equal, full ROM SKIN: warm, color normal PSYCH: anxious, screaming while having IV placed  ED Course  Procedures   DIAGNOSTIC STUDIES: Oxygen Saturation is 99% on room air, adequate by my interpretation.    COORDINATION OF CARE: 12:22 PM Discussed treatment plan with patient at beside, the patient agrees with the plan and has no further questions at this time. 2:51 PM Pt here  with nausea/vomiting/HA She reports HA is similar to prior headaches and was not sudden onset Her vomiting is improved She has no focal weakness and she is ambulatory.   Will try PO meds then d/c home I doubt acute neurologic emergency, I doubt SAH and I doubt meningitis Elevated BP likely due to missed doses of medications BP 200/108 mmHg  Pulse 72  Temp(Src) 98.1 F (36.7 C) (Oral)  Resp 16  Ht $R'5\' 3"'tO$  (1.6 m)  Wt 152 lb (68.947 kg)  BMI 26.93 kg/m2  SpO2 99%  Labs Review Labs Reviewed  COMPREHENSIVE METABOLIC PANEL - Abnormal; Notable for the following:    Glucose, Bld 101 (*)    Total Protein 8.8 (*)    All other components within normal limits  CBC WITH DIFFERENTIAL - Abnormal; Notable for the following:    RBC 5.30 (*)    Hemoglobin 16.8 (*)    HCT 48.7 (*)  Platelets 129 (*)    Neutrophils Relative % 86 (*)    Lymphocytes Relative 10 (*)    All other components within normal limits  URINALYSIS, ROUTINE W REFLEX MICROSCOPIC - Abnormal; Notable for the following:    Protein, ur 30 (*)    All other components within normal limits  URINE MICROSCOPIC-ADD ON - Abnormal; Notable for the following:    Squamous Epithelial / LPF FEW (*)    All other components within normal limits  LIPASE, BLOOD     EKG Interpretation   Date/Time:  Wednesday August 14 2014 12:44:41 EST Ventricular Rate:  62 PR Interval:  138 QRS Duration: 82 QT Interval:  462 QTC Calculation: 469 R Axis:   74 Text Interpretation:  Sinus rhythm Consider left ventricular hypertrophy  Otherwise no significant change from prior Confirmed by Christy Gentles  MD,  Fraser Busche ([id number]) on 08/14/2014 12:52:02 PM       Medications  metoCLOPramide (REGLAN) injection 10 mg (10 mg Intramuscular Given 08/14/14 1230)  diphenhydrAMINE (BENADRYL) injection 50 mg (50 mg Intramuscular Given 08/14/14 1230)  metoCLOPramide (REGLAN) injection 10 mg (10 mg Intramuscular Given 08/14/14 1324)  diphenhydrAMINE (BENADRYL) injection  25 mg (25 mg Intramuscular Given 08/14/14 1324)  ondansetron (ZOFRAN-ODT) disintegrating tablet 8 mg (8 mg Oral Given 08/14/14 1455)  oxyCODONE-acetaminophen (PERCOCET/ROXICET) 5-325 MG per tablet 1 tablet (1 tablet Oral Given 08/14/14 1454)  ibuprofen (ADVIL,MOTRIN) tablet 800 mg (800 mg Oral Given 08/14/14 1454)    MDM   Final diagnoses:  Other headache syndrome  Essential hypertension    Nursing notes including past medical history and social history reviewed and considered in documentation Labs/vital reviewed myself and considered during evaluation    I personally performed the services described in this documentation, which was scribed in my presence. The recorded information has been reviewed and is accurate.      Sharyon Cable, MD 08/14/14 1524

## 2014-08-14 NOTE — ED Notes (Signed)
Pt states abdominal pain, headache, vomiting, body aches x 2 days. Pt denies diarrhea. Pt actively vomiting in triage.

## 2014-08-19 ENCOUNTER — Ambulatory Visit (HOSPITAL_COMMUNITY): Payer: Commercial Managed Care - HMO

## 2014-08-22 ENCOUNTER — Ambulatory Visit: Payer: Commercial Managed Care - HMO | Admitting: Orthopedic Surgery

## 2014-08-26 ENCOUNTER — Other Ambulatory Visit: Payer: Self-pay

## 2014-08-26 DIAGNOSIS — B1920 Unspecified viral hepatitis C without hepatic coma: Secondary | ICD-10-CM

## 2014-09-03 ENCOUNTER — Other Ambulatory Visit: Payer: Self-pay

## 2014-09-03 ENCOUNTER — Telehealth: Payer: Self-pay | Admitting: Internal Medicine

## 2014-09-03 DIAGNOSIS — K7460 Unspecified cirrhosis of liver: Secondary | ICD-10-CM

## 2014-09-03 NOTE — Telephone Encounter (Signed)
Letter mailed with [place] reminder. [place] is scheduled for 09/20/2014 @ 1045am .

## 2014-09-03 NOTE — Telephone Encounter (Signed)
PA # for [place] [id number]

## 2014-09-03 NOTE — Telephone Encounter (Signed)
ULTRASOUND AND HCV RNA FEB 2016

## 2014-09-03 NOTE — Telephone Encounter (Signed)
Letter and lab order have been mailed to the pt. 

## 2014-09-10 ENCOUNTER — Telehealth: Payer: Self-pay | Admitting: Orthopedic Surgery

## 2014-09-10 ENCOUNTER — Other Ambulatory Visit: Payer: Self-pay | Admitting: *Deleted

## 2014-09-10 DIAGNOSIS — M5442 Lumbago with sciatica, left side: Secondary | ICD-10-CM

## 2014-09-10 DIAGNOSIS — M5410 Radiculopathy, site unspecified: Secondary | ICD-10-CM

## 2014-09-10 MED ORDER — HYDROCODONE-ACETAMINOPHEN 5-325 MG PO TABS: 1.0000 | ORAL_TABLET | ORAL | Status: DC | PRN

## 2014-09-10 NOTE — Telephone Encounter (Signed)
Patient picked up Rx

## 2014-09-10 NOTE — Telephone Encounter (Signed)
Patient called to request refill of medication: HYDROcodone-acetaminophen (NORCO/VICODIN) 5-325 MG per tablet [[id number]] - ph [phone]

## 2014-09-10 NOTE — Telephone Encounter (Signed)
Prescription available, patient aware  

## 2014-09-17 ENCOUNTER — Ambulatory Visit (INDEPENDENT_AMBULATORY_CARE_PROVIDER_SITE_OTHER): Payer: Commercial Managed Care - HMO | Admitting: Orthopedic Surgery

## 2014-09-17 VITALS — BP 140/91 | Ht 63.0 in | Wt 152.0 lb

## 2014-09-17 DIAGNOSIS — M5442 Lumbago with sciatica, left side: Secondary | ICD-10-CM | POA: Diagnosis not present

## 2014-09-17 DIAGNOSIS — Z9889 Other specified postprocedural states: Secondary | ICD-10-CM

## 2014-09-17 DIAGNOSIS — M1712 Unilateral primary osteoarthritis, left knee: Secondary | ICD-10-CM | POA: Diagnosis not present

## 2014-09-17 DIAGNOSIS — S82142P Displaced bicondylar fracture of left tibia, subsequent encounter for closed fracture with malunion: Secondary | ICD-10-CM | POA: Diagnosis not present

## 2014-09-17 NOTE — Progress Notes (Signed)
Patient ID: Colleen Lee, female   DOB: [birth year], 59 y.o.   MRN: [id number] Chief Complaint  Patient presents with  . Follow-up    3 month follow up back/knee s/p medication    The patient has continued pain in her left knee because good relief from injection  Review of systems continued radicular symptoms left leg. Patient has not heard from the neurosurgeon and 5 months I told her to call them back apparently they're planning some type of vertical MRI or CT scan to image her spine  She still walks without support has varus alignment to her knee medial joint line tenderness no effusion knee flexion 125 knee stable motor exam normal skin intact pulses normal  Encounter Diagnoses  Name Primary?  . Low back pain with left-sided sciatica, unspecified back pain laterality   . Tibial plateau fracture, left, closed, with malunion, subsequent encounter   . S/P arthroscopy of left knee   . Primary osteoarthritis of left knee Yes   Procedure note left knee injection verbal consent was obtained to inject left knee joint  Timeout was completed to confirm the site of injection  The medications used were 40 mg of Depo-Medrol and 1% lidocaine 3 cc  Anesthesia was provided by ethyl chloride and the skin was prepped with alcohol.  After cleaning the skin with alcohol a 20-gauge needle was used to inject the left knee joint. There were no complications. A sterile bandage was applied.

## 2014-09-20 ENCOUNTER — Ambulatory Visit (HOSPITAL_COMMUNITY)
Admission: RE | Admit: 2014-09-20 | Discharge: 2014-09-20 | Disposition: A | Discharge status: Home / Self Care | Payer: Commercial Managed Care - HMO | Source: Ambulatory Visit | Attending: Gastroenterology | Admitting: Gastroenterology

## 2014-09-20 DIAGNOSIS — B182 Chronic viral hepatitis C: Secondary | ICD-10-CM | POA: Insufficient documentation

## 2014-09-20 DIAGNOSIS — K7460 Unspecified cirrhosis of liver: Secondary | ICD-10-CM | POA: Diagnosis not present

## 2014-09-24 DIAGNOSIS — M549 Dorsalgia, unspecified: Secondary | ICD-10-CM | POA: Diagnosis not present

## 2014-09-27 DIAGNOSIS — I10 Essential (primary) hypertension: Secondary | ICD-10-CM | POA: Diagnosis not present

## 2014-09-27 DIAGNOSIS — E784 Other hyperlipidemia: Secondary | ICD-10-CM | POA: Diagnosis not present

## 2014-09-30 NOTE — Progress Notes (Signed)
Quick Note:  Elastography reviewed. F3/F4. Needs Hep C RNA. Also needs OV with [place]. ______

## 2014-10-04 ENCOUNTER — Other Ambulatory Visit: Payer: Self-pay | Admitting: *Deleted

## 2014-10-04 DIAGNOSIS — M5442 Lumbago with sciatica, left side: Secondary | ICD-10-CM

## 2014-10-04 DIAGNOSIS — M5410 Radiculopathy, site unspecified: Secondary | ICD-10-CM

## 2014-10-04 MED ORDER — HYDROCODONE-ACETAMINOPHEN 5-325 MG PO TABS: 1.0000 | ORAL_TABLET | ORAL | Status: DC | PRN

## 2014-10-04 NOTE — Progress Notes (Signed)
APPOINTMENT MADE AND PATIENT AWARE OF DATE AND TIME  °

## 2014-10-08 DIAGNOSIS — B1920 Unspecified viral hepatitis C without hepatic coma: Secondary | ICD-10-CM | POA: Diagnosis not present

## 2014-10-09 LAB — HEPATITIS C RNA QUANTITATIVE: HCV Quantitative: NOT DETECTED IU/mL (ref <15)

## 2014-10-14 NOTE — Progress Notes (Signed)
Quick Note:  GREAT NEWS!!!! Hep C eradicated.   ______

## 2014-10-22 DIAGNOSIS — E784 Other hyperlipidemia: Secondary | ICD-10-CM | POA: Diagnosis not present

## 2014-10-22 DIAGNOSIS — I10 Essential (primary) hypertension: Secondary | ICD-10-CM | POA: Diagnosis not present

## 2014-10-30 ENCOUNTER — Ambulatory Visit (INDEPENDENT_AMBULATORY_CARE_PROVIDER_SITE_OTHER): Payer: Commercial Managed Care - HMO | Admitting: Gastroenterology

## 2014-10-30 ENCOUNTER — Encounter: Payer: Self-pay | Admitting: Gastroenterology

## 2014-10-30 VITALS — BP 176/92 | HR 97 | Temp 98.3°F | Ht 66.0 in | Wt 151.8 lb

## 2014-10-30 DIAGNOSIS — Z800 Family history of malignant neoplasm of digestive organs: Secondary | ICD-10-CM | POA: Diagnosis not present

## 2014-10-30 DIAGNOSIS — Z8619 Personal history of other infectious and parasitic diseases: Secondary | ICD-10-CM

## 2014-10-30 DIAGNOSIS — K7460 Unspecified cirrhosis of liver: Principal | ICD-10-CM

## 2014-10-30 DIAGNOSIS — B182 Chronic viral hepatitis C: Secondary | ICD-10-CM

## 2014-10-30 NOTE — Progress Notes (Signed)
Referring Provider: Rosita Fire, MD Primary Care Physician:  Rosita Fire, MD  Primary GI: Dr. Gala Romney   Chief Complaint  Patient presents with  . Follow-up    HPI:   Colleen Lee is a 59 y.o. female presenting today with a history of chronic Hep C (genotype 1a) cirrhosis and constipation. Treated with Harvoni starting in Aug 2015, successfully completing a 12 week course. Final HCV RNA 3 months post-treatment with excellent virologic response, non-detectable. US abdomen up-to-date, with next surveillance in Aug 2016.   No abdominal pain. Reflux symptoms worsened with spaghetti or eggs. May take a little bit of mag citrate as needed. Linzess "knots her stomach up", Amitiza didn't work. No rectal bleeding.    Past Medical History  Diagnosis Date  . Hypertension   . Fibroids   . [name]     HCV RNA positive 09/2012  . GERD (gastroesophageal reflux disease)   . H. pylori infection 2014    treated with pylera  . Asthma   . PONV (postoperative nausea and vomiting)     pt thinks maybe once she had N&V  . Cirrhosis     Metavir score F4 on elastography 2015    Past Surgical History  Procedure Laterality Date  . Knee surgery      left knee  . Colonoscopy  01/18/2008    [id number] rectum.  Long redundant colon, a diminutive sigmoid polyp status post cold biopsy removed. Hyperplastic polyp. Repeat colonoscopy June 2014 due to family history of colon cancer  . Esophagogastroduodenoscopy  01/18/2008    RMR: Normal esophagus, normal  stomach  . Colonoscopy with esophagogastroduodenoscopy (egd) N/A 11/02/2012    [id number]:[id number] gastric mucosa of doubtful, +H.pylori. Incomplete colonoscopy due to patient unable to tolerate exam, proximal colon seen. Patient refused ACBE.  . Colonoscopy with propofol N/A 03/08/2013    Dr. Gala Romney: colonic polyp-removed as scribed above. Internal Hemorrhoids. Pathology did not reveal any colonic tissue, only mucus. SURVEILLANCE DUE Aug 2019  . Polypectomy  N/A 03/08/2013    Procedure: POLYPECTOMY;  Surgeon: Daneil Dolin, MD;  Location: [facility];  Service: Endoscopy;  Laterality: N/A;  . Multiple extractions with alveoloplasty N/A 10/15/2013    Procedure: MULTIPLE EXTRACTION WITH ALVEOLOPLASTY;  Surgeon: Gae Bon, DDS;  Location: [facility];  Service: Oral Surgery;  Laterality: N/A;    Current Outpatient Prescriptions  Medication Sig Dispense Refill  . Cyanocobalamin (VITAMIN B 12 PO) Take 1 tablet by mouth daily.    Marland Kitchen HYDROcodone-acetaminophen (NORCO/VICODIN) 5-325 MG per tablet Take 1 tablet by mouth every 4 (four) hours as needed for moderate pain. 180 tablet 0  . ibuprofen (ADVIL,MOTRIN) 800 MG tablet Take 1 tablet (800 mg total) by mouth every 8 (eight) hours as needed. 90 tablet 5  . lisinopril (PRINIVIL,ZESTRIL) 20 MG tablet Take 20 mg by mouth daily.    . Megestrol Acetate (MEGACE ORAL PO) Take 1 tablet by mouth daily.    . Nicotine (NICODERM CQ TD) Place onto the skin.    Marland Kitchen ondansetron (ZOFRAN) 8 MG tablet Take 1 tablet (8 mg total) by mouth every 8 (eight) hours as needed. 12 tablet 0  . potassium chloride SA (K-DUR,KLOR-CON) 20 MEQ tablet Take 20 mEq by mouth daily.     No current facility-administered medications for this visit.    Allergies as of 10/30/2014 - Review Complete 10/30/2014  Allergen Reaction Noted  . Penicillins Rash     Family History  Problem Relation Age of Onset  .  Colon cancer Brother        . Asthma      FH  . Multiple myeloma Brother   . Liver cancer Sister   . Prostate cancer Brother   . Pancreatic cancer Brother   . Cancer Other     niece, age 1, primary unknown    History   Social History  . Marital Status: Married    Spouse Name: N/A  . Number of Children: 0  . Years of Education: N/A   Occupational History  . umemployed     Social History Main Topics  . Smoking status: Current Every Day Smoker -- 1.00 packs/day for 40 years    Types: Cigarettes  . Smokeless tobacco: Not on file      Comment: Smokes one pack of cigarettes daily  . Alcohol Use: No     Comment: former, last Jan 2014  . Drug Use: No     Comment: history of marijuana in past  . Sexual Activity: No   Other Topics Concern  . None   Social History Narrative    Review of Systems: As mentioned in HPI  Physical Exam: BP 176/92 mmHg  Pulse 97  Temp(Src) 98.3 F (36.8 C)  Ht $R'5\' 6"'OQ$  (1.676 m)  Wt 151 lb 12.8 oz (68.856 kg)  BMI 24.51 kg/m2 General:   Alert and oriented. No distress noted. Pleasant and cooperative.  Head:  Normocephalic and atraumatic. Eyes:  Conjuctiva clear without scleral icterus. Abdomen:  +BS, soft, non-tender and non-distended. No rebound or guarding. No HSM or masses noted. Msk:  Symmetrical without gross deformities. Normal posture. Extremities:  Without edema. Neurologic:  Alert and  oriented x4;  grossly normal neurologically. Psych:  Alert and cooperative. Normal mood and affect.

## 2014-10-30 NOTE — Assessment & Plan Note (Signed)
Completed treatment in mid Nov 2015, with HCV RNA in Feb 2016 non-detectable. Successfully eradicated with Harvoni. Cirrhosis care up-to-date, with next ultrasound in 6 months. Consider EGD in 2017 to screen for varices (last evaluation in 2014 without evidence of varices). Return in 6 months.

## 2014-10-30 NOTE — Progress Notes (Signed)
cc'ed to pcp °

## 2014-10-30 NOTE — Assessment & Plan Note (Signed)
Next surveillance 2019.

## 2014-10-30 NOTE — Assessment & Plan Note (Signed)
Treated in 2014. Check urea breath test now to document eradication.

## 2014-10-30 NOTE — Patient Instructions (Signed)
Please complete the breath test at the lab.   We will see you back in 6 months!  I would like for you to make an appointment to see Dr. Legrand Rams about your blood pressure.

## 2014-11-01 LAB — H. PYLORI BREATH TEST: H. pylori Breath Test: DETECTED — AB

## 2014-11-04 ENCOUNTER — Other Ambulatory Visit: Payer: Self-pay | Admitting: *Deleted

## 2014-11-04 ENCOUNTER — Telehealth: Payer: Self-pay | Admitting: Orthopedic Surgery

## 2014-11-04 DIAGNOSIS — M5442 Lumbago with sciatica, left side: Secondary | ICD-10-CM

## 2014-11-04 DIAGNOSIS — M5410 Radiculopathy, site unspecified: Secondary | ICD-10-CM

## 2014-11-04 MED ORDER — HYDROCODONE-ACETAMINOPHEN 5-325 MG PO TABS: 1.0000 | ORAL_TABLET | ORAL | Status: DC | PRN

## 2014-11-04 NOTE — Telephone Encounter (Signed)
Patient picked up Rx

## 2014-11-04 NOTE — Telephone Encounter (Signed)
Patient is calling requesting a refill on pain medication HYDROcodone-acetaminophen (NORCO/VICODIN) 5-325 MG per tablet, please advise?

## 2014-11-04 NOTE — Telephone Encounter (Signed)
Prescription available, patient aware  

## 2014-11-19 ENCOUNTER — Encounter (HOSPITAL_COMMUNITY): Payer: Self-pay | Admitting: Emergency Medicine

## 2014-11-19 ENCOUNTER — Emergency Department (HOSPITAL_COMMUNITY)
Admission: EM | Admit: 2014-11-19 | Discharge: 2014-11-19 | Disposition: A | Discharge status: Home / Self Care | Payer: Commercial Managed Care - HMO | Attending: Emergency Medicine | Admitting: Emergency Medicine

## 2014-11-19 DIAGNOSIS — R51 Headache: Secondary | ICD-10-CM | POA: Insufficient documentation

## 2014-11-19 DIAGNOSIS — I10 Essential (primary) hypertension: Secondary | ICD-10-CM

## 2014-11-19 DIAGNOSIS — J45909 Unspecified asthma, uncomplicated: Secondary | ICD-10-CM | POA: Diagnosis not present

## 2014-11-19 DIAGNOSIS — Z880 Allergy status to penicillin: Secondary | ICD-10-CM | POA: Diagnosis not present

## 2014-11-19 DIAGNOSIS — Z79899 Other long term (current) drug therapy: Secondary | ICD-10-CM | POA: Diagnosis not present

## 2014-11-19 DIAGNOSIS — Z8742 Personal history of other diseases of the female genital tract: Secondary | ICD-10-CM | POA: Diagnosis not present

## 2014-11-19 DIAGNOSIS — Z8719 Personal history of other diseases of the digestive system: Secondary | ICD-10-CM | POA: Diagnosis not present

## 2014-11-19 DIAGNOSIS — Z720 Tobacco use: Secondary | ICD-10-CM | POA: Diagnosis not present

## 2014-11-19 DIAGNOSIS — Z8619 Personal history of other infectious and parasitic diseases: Secondary | ICD-10-CM | POA: Insufficient documentation

## 2014-11-19 DIAGNOSIS — R519 Headache, unspecified: Secondary | ICD-10-CM

## 2014-11-19 LAB — CBC WITH DIFFERENTIAL/PLATELET
Basophils Absolute: 0 10*3/uL (ref 0.0–0.1)
Basophils Relative: 0 % (ref 0–1)
Eosinophils Absolute: 0 10*3/uL (ref 0.0–0.7)
Eosinophils Relative: 0 % (ref 0–5)
HCT: 47.3 % — ABNORMAL HIGH (ref 36.0–46.0)
Hemoglobin: 16.8 g/dL — ABNORMAL HIGH (ref 12.0–15.0)
Lymphocytes Relative: 9 % — ABNORMAL LOW (ref 12–46)
Lymphs Abs: 0.7 10*3/uL (ref 0.7–4.0)
MCH: 32.6 pg (ref 26.0–34.0)
MCHC: 35.5 g/dL (ref 30.0–36.0)
MCV: 91.7 fL (ref 78.0–100.0)
Monocytes Absolute: 0.4 10*3/uL (ref 0.1–1.0)
Monocytes Relative: 5 % (ref 3–12)
Neutro Abs: 7.2 10*3/uL (ref 1.7–7.7)
Neutrophils Relative %: 86 % — ABNORMAL HIGH (ref 43–77)
Platelets: 202 10*3/uL (ref 150–400)
RBC: 5.16 MIL/uL — ABNORMAL HIGH (ref 3.87–5.11)
RDW: 13.9 % (ref 11.5–15.5)
WBC: 8.4 10*3/uL (ref 4.0–10.5)

## 2014-11-19 LAB — BASIC METABOLIC PANEL
Anion gap: 13 (ref 5–15)
BUN: 10 mg/dL (ref 6–23)
CO2: 20 mmol/L (ref 19–32)
Calcium: 10.8 mg/dL — ABNORMAL HIGH (ref 8.4–10.5)
Chloride: 103 mmol/L (ref 96–112)
Creatinine, Ser: 0.79 mg/dL (ref 0.50–1.10)
GFR calc Af Amer: >90 mL/min (ref >90)
GFR calc non Af Amer: 89 mL/min — ABNORMAL LOW (ref >90)
Glucose, Bld: 113 mg/dL — ABNORMAL HIGH (ref 70–99)
Potassium: 4.1 mmol/L (ref 3.5–5.1)
Sodium: 136 mmol/L (ref 135–145)

## 2014-11-19 MED ORDER — KETOROLAC TROMETHAMINE 30 MG/ML IJ SOLN
30.0000 mg | Freq: Once | INTRAMUSCULAR | Status: AC
  Administered 2014-11-19: 30 mg via INTRAVENOUS
  Filled 2014-11-19: qty 1

## 2014-11-19 MED ORDER — ONDANSETRON HCL 4 MG/2ML IJ SOLN
4.0000 mg | Freq: Once | INTRAMUSCULAR | Status: AC
  Administered 2014-11-19: 4 mg via INTRAVENOUS
  Filled 2014-11-19: qty 2

## 2014-11-19 MED ORDER — TRAMADOL HCL 50 MG PO TABS: 50.0000 mg | ORAL_TABLET | Freq: Four times a day (QID) | ORAL | Status: DC | PRN

## 2014-11-19 MED ORDER — HYDROMORPHONE HCL 1 MG/ML IJ SOLN
1.0000 mg | Freq: Once | INTRAMUSCULAR | Status: AC
  Administered 2014-11-19: 1 mg via INTRAVENOUS
  Filled 2014-11-19: qty 1

## 2014-11-19 NOTE — ED Provider Notes (Signed)
CSN: [id number]     Arrival date & time 11/19/14  1405 History   First MD Initiated Contact with Patient 11/19/14 1453     Chief Complaint  Patient presents with  . Headache     (Consider location/radiation/quality/duration/timing/severity/associated sxs/prior Treatment) Patient is a 59 y.o. female presenting with headaches. The history is provided by the patient (the pt complains of a headache).  Headache Pain location:  Generalized Quality:  Dull Radiates to:  Does not radiate Severity currently:  6/10 Severity at highest:  9/10 Onset quality:  Sudden Timing:  Constant Progression:  Unchanged Associated symptoms: no abdominal pain, no back pain, no congestion, no cough, no diarrhea, no fatigue, no seizures and no sinus pressure     Past Medical History  Diagnosis Date  . Hypertension   . Fibroids   . [name]     HCV RNA positive 09/2012  . GERD (gastroesophageal reflux disease)   . H. pylori infection 2014    treated with pylera  . Asthma   . PONV (postoperative nausea and vomiting)     pt thinks maybe once she had N&V  . Cirrhosis     Metavir score F4 on elastography 2015   Past Surgical History  Procedure Laterality Date  . Knee surgery      left knee  . Colonoscopy  01/18/2008    [id number]:[id number] rectum.  Long redundant colon, a diminutive sigmoid polyp status post cold biopsy removed. Hyperplastic polyp. Repeat colonoscopy June 2014 due to family history of colon cancer  . Esophagogastroduodenoscopy  01/18/2008    RMR: Normal esophagus, normal  stomach  . Colonoscopy with esophagogastroduodenoscopy (egd) N/A 11/02/2012    GGE:[id number] gastric mucosa of doubtful, +H.pylori. Incomplete colonoscopy due to patient unable to tolerate exam, proximal colon seen. Patient refused ACBE.  . Colonoscopy with propofol N/A 03/08/2013    Dr. Gala Romney: colonic polyp-removed as scribed above. Internal Hemorrhoids. Pathology did not reveal any colonic tissue, only mucus. SURVEILLANCE DUE  Aug 2019  . Polypectomy N/A 03/08/2013    Procedure: POLYPECTOMY;  Surgeon: Daneil Dolin, MD;  Location: [facility];  Service: Endoscopy;  Laterality: N/A;  . Multiple extractions with alveoloplasty N/A 10/15/2013    Procedure: MULTIPLE EXTRACTION WITH ALVEOLOPLASTY;  Surgeon: Gae Bon, DDS;  Location: [facility];  Service: Oral Surgery;  Laterality: N/A;   Family History  Problem Relation Age of Onset  . Colon cancer Brother        . Asthma      FH  . Multiple myeloma Brother   . Liver cancer Sister   . Prostate cancer Brother   . Pancreatic cancer Brother   . Cancer Other     niece, age 86, primary unknown   History  Substance Use Topics  . Smoking status: Current Every Day Smoker -- 1.00 packs/day for 40 years    Types: Cigarettes  . Smokeless tobacco: Not on file     Comment: Smokes one pack of cigarettes daily  . Alcohol Use: No     Comment: former, last Jan 2014   OB History    No data available     Review of Systems  Constitutional: Negative for appetite change and fatigue.  HENT: Negative for congestion, ear discharge and sinus pressure.   Eyes: Negative for discharge.  Respiratory: Negative for cough.   Cardiovascular: Negative for chest pain.  Gastrointestinal: Negative for abdominal pain and diarrhea.  Genitourinary: Negative for frequency and hematuria.  Musculoskeletal: Negative for back pain.  Skin: Negative for rash.  Neurological: Positive for headaches. Negative for seizures.  Psychiatric/Behavioral: Negative for hallucinations.      Allergies  Penicillins  Home Medications   Prior to Admission medications   Medication Sig Start Date End Date Taking? Authorizing Provider  Cyanocobalamin (VITAMIN B 12 PO) Take 1 tablet by mouth daily.    Historical Provider, MD  HYDROcodone-acetaminophen (NORCO/VICODIN) 5-325 MG per tablet Take 1 tablet by mouth every 4 (four) hours as needed for moderate pain. 11/04/14   Carole Civil, MD  ibuprofen  (ADVIL,MOTRIN) 800 MG tablet Take 1 tablet (800 mg total) by mouth every 8 (eight) hours as needed. 04/05/14   Carole Civil, MD  lisinopril (PRINIVIL,ZESTRIL) 20 MG tablet Take 20 mg by mouth daily.    Historical Provider, MD  Megestrol Acetate (MEGACE ORAL PO) Take 1 tablet by mouth daily.    Historical Provider, MD  Nicotine (NICODERM CQ TD) Place onto the skin.    Historical Provider, MD  ondansetron (ZOFRAN) 8 MG tablet Take 1 tablet (8 mg total) by mouth every 8 (eight) hours as needed. 08/14/14   Ripley Fraise, MD  potassium chloride SA (K-DUR,KLOR-CON) 20 MEQ tablet Take 20 mEq by mouth daily.    Historical Provider, MD  traMADol (ULTRAM) 50 MG tablet Take 1 tablet (50 mg total) by mouth every 6 (six) hours as needed. 11/19/14   Milton Ferguson, MD   BP 135/100 mmHg  Pulse 96  Temp(Src) 99.8 F (37.7 C) (Oral)  Resp 16  Ht $R'5\' 6"'nM$  (1.676 m)  Wt 150 lb (68.04 kg)  BMI 24.22 kg/m2  SpO2 98% Physical Exam  Constitutional: She is oriented to person, place, and time. She appears well-developed.  HENT:  Head: Normocephalic.  Eyes: Conjunctivae and EOM are normal. No scleral icterus.  Neck: Neck supple. No thyromegaly present.  Cardiovascular: Normal rate and regular rhythm.  Exam reveals no gallop and no friction rub.   No murmur heard. Pulmonary/Chest: No stridor. She has no wheezes. She has no rales. She exhibits no tenderness.  Abdominal: She exhibits no distension. There is no tenderness. There is no rebound.  Musculoskeletal: Normal range of motion. She exhibits no edema.  Lymphadenopathy:    She has no cervical adenopathy.  Neurological: She is oriented to person, place, and time. She exhibits normal muscle tone. Coordination normal.  Skin: No rash noted. No erythema.  Psychiatric: She has a normal mood and affect. Her behavior is normal.    ED Course  Procedures (including critical care time) Labs Review Labs Reviewed  BASIC METABOLIC PANEL - Abnormal; Notable for the  following:    Glucose, Bld 113 (*)    Calcium 10.8 (*)    GFR calc non Af Amer 89 (*)    All other components within normal limits  CBC WITH DIFFERENTIAL/PLATELET - Abnormal; Notable for the following:    RBC 5.16 (*)    Hemoglobin 16.8 (*)    HCT 47.3 (*)    Neutrophils Relative % 86 (*)    Lymphocytes Relative 9 (*)    All other components within normal limits  CBC WITH DIFFERENTIAL/PLATELET    Imaging Review No results found.   EKG Interpretation None      MDM   Final diagnoses:  Headache disorder  Essential hypertension    Headache improved,  tx with ultram and pt is to follow up with pcp this week   Milton Ferguson, MD 11/19/14 1710

## 2014-11-19 NOTE — Discharge Instructions (Signed)
Follow up with your md in 2-3 days °

## 2014-11-19 NOTE — ED Notes (Signed)
Pt reports headache, light and sound sensitivity x1 week. nad noted.

## 2014-11-22 DIAGNOSIS — I10 Essential (primary) hypertension: Secondary | ICD-10-CM | POA: Diagnosis not present

## 2014-11-22 DIAGNOSIS — E784 Other hyperlipidemia: Secondary | ICD-10-CM | POA: Diagnosis not present

## 2014-12-02 ENCOUNTER — Telehealth: Payer: Self-pay | Admitting: Orthopedic Surgery

## 2014-12-02 ENCOUNTER — Other Ambulatory Visit: Payer: Self-pay | Admitting: *Deleted

## 2014-12-02 DIAGNOSIS — M5410 Radiculopathy, site unspecified: Secondary | ICD-10-CM

## 2014-12-02 DIAGNOSIS — M5442 Lumbago with sciatica, left side: Secondary | ICD-10-CM

## 2014-12-02 MED ORDER — HYDROCODONE-ACETAMINOPHEN 5-325 MG PO TABS: 1.0000 | ORAL_TABLET | ORAL | Status: DC | PRN

## 2014-12-02 NOTE — Telephone Encounter (Signed)
Patient picked up Rx

## 2014-12-02 NOTE — Telephone Encounter (Signed)
Patient is calling requesting a refill on pain medication HYDROcodone-acetaminophen (NORCO/VICODIN) 5-325 MG per tablet please advise? 

## 2014-12-02 NOTE — Telephone Encounter (Signed)
Prescription available, patient aware  

## 2014-12-17 ENCOUNTER — Encounter: Payer: Self-pay | Admitting: Orthopedic Surgery

## 2014-12-17 ENCOUNTER — Ambulatory Visit (INDEPENDENT_AMBULATORY_CARE_PROVIDER_SITE_OTHER): Payer: Commercial Managed Care - HMO

## 2014-12-17 ENCOUNTER — Ambulatory Visit (INDEPENDENT_AMBULATORY_CARE_PROVIDER_SITE_OTHER): Payer: Commercial Managed Care - HMO | Admitting: Orthopedic Surgery

## 2014-12-17 VITALS — BP 151/90 | Ht 66.0 in | Wt 150.0 lb

## 2014-12-17 DIAGNOSIS — M25562 Pain in left knee: Secondary | ICD-10-CM | POA: Diagnosis not present

## 2014-12-17 DIAGNOSIS — M1732 Unilateral post-traumatic osteoarthritis, left knee: Secondary | ICD-10-CM

## 2014-12-17 DIAGNOSIS — S82142P Displaced bicondylar fracture of left tibia, subsequent encounter for closed fracture with malunion: Secondary | ICD-10-CM

## 2014-12-17 DIAGNOSIS — Z9889 Other specified postprocedural states: Secondary | ICD-10-CM

## 2014-12-17 DIAGNOSIS — M5442 Lumbago with sciatica, left side: Secondary | ICD-10-CM

## 2014-12-17 NOTE — Progress Notes (Signed)
P ID: Colleen Lee, female   DOB: 1956/01/03, 59 y.o.   MRN: [id number] Encounter Diagnoses  Name Primary?  . Left knee pain   . Low back pain with left-sided sciatica, unspecified back pain laterality   . S/P arthroscopy of left knee   . Tibial plateau fracture, left, closed, with malunion, subsequent encounter   . Post-traumatic osteoarthritis of left knee Yes   Chief Complaint  Patient presents with  . Follow-up    3 month follow up back/knee resp to [facility] has several problems as listed above  She comes in for recheck regarding her left knee and lumbar spine condition. She is noted to have seen the neurosurgeon who did not think surgery was needed. She's had some epidurals with good success. She has some mild back pain at this time and denies radicular symptoms at present  She still having medial knee pain which is aggravated cold temperature and rain  BP 151/90 mmHg  Ht 5\' 6"  (1.676 m)  Wt 150 lb (68.04 kg)  BMI 24.22 kg/m2 She does have some mild lower back tenderness.  The left knee is in varus alignment she has a lot of medial joint line tenderness without swelling. Fortunately there is no instability. Her muscle tone is good scans intact she has good distal pulses  Her x-ray today shows severe varus alignment to the left knee  We discussed possibility of knee replacement and we also injected the left knee and then reordered her prescription pain medications.  She will call us when she's ready for surgery

## 2014-12-17 NOTE — Patient Instructions (Signed)
Call when ready for tka left knee  You have decided to proceed with knee replacement surgery. You have decided not to continue with nonoperative measures such as but not limited to oral medication, weight loss, activity modification, physical therapy, bracing, or injection.  We will perform the procedure commonly known as total knee replacement. Some of the risks associated with knee replacement surgery include but are not limited to Bleeding Infection Swelling Stiffness Blood clot Pain that persists even after surgery  Infection is especially devastating complication of knee surgery although rare. If infection does occur your implant will usually have to be removed and several surgeries will be needed to eradicate the infection prior to performing a repeat replacement.   If you're not comfortable with these risks and would like to continue with nonoperative treatment please let Dr. Aline Brochure know prior to your surgery.

## 2014-12-19 ENCOUNTER — Telehealth: Payer: Self-pay | Admitting: Gastroenterology

## 2014-12-19 MED ORDER — RIFABUTIN 150 MG PO CAPS: 300.0000 mg | ORAL_CAPSULE | Freq: Every day | ORAL | Status: DC

## 2014-12-19 MED ORDER — ESOMEPRAZOLE MAGNESIUM 40 MG PO CPDR: 40.0000 mg | DELAYED_RELEASE_CAPSULE | ORAL | Status: DC

## 2014-12-19 MED ORDER — MOXIFLOXACIN HCL 400 MG PO TABS: 400.0000 mg | ORAL_TABLET | ORAL | Status: DC

## 2014-12-19 NOTE — Telephone Encounter (Signed)
Nexium 40 mg daily with moxifloxacin 400 mg daily and rifabutin 300 mg daily for 10 days sent to pharmacy. May require a prior authorization.

## 2014-12-19 NOTE — Telephone Encounter (Signed)
Pt is aware.  

## 2014-12-20 NOTE — Progress Notes (Signed)
Quick Note:  Let's do a breath test in 6 weeks AFTER she completes antibiotics. Will need to be off PPI at that time, which shouldn't be a problem because she is not on one chronically. ______

## 2014-12-23 ENCOUNTER — Other Ambulatory Visit: Payer: Self-pay | Admitting: Gastroenterology

## 2014-12-23 DIAGNOSIS — E784 Other hyperlipidemia: Secondary | ICD-10-CM | POA: Diagnosis not present

## 2014-12-23 DIAGNOSIS — I10 Essential (primary) hypertension: Secondary | ICD-10-CM | POA: Diagnosis not present

## 2014-12-23 DIAGNOSIS — Z8619 Personal history of other infectious and parasitic diseases: Secondary | ICD-10-CM

## 2015-01-01 ENCOUNTER — Telehealth: Payer: Self-pay | Admitting: Orthopedic Surgery

## 2015-01-01 ENCOUNTER — Other Ambulatory Visit: Payer: Self-pay | Admitting: *Deleted

## 2015-01-01 DIAGNOSIS — M5410 Radiculopathy, site unspecified: Secondary | ICD-10-CM

## 2015-01-01 DIAGNOSIS — M5442 Lumbago with sciatica, left side: Secondary | ICD-10-CM

## 2015-01-01 MED ORDER — HYDROCODONE-ACETAMINOPHEN 5-325 MG PO TABS: 1.0000 | ORAL_TABLET | ORAL | Status: DC | PRN

## 2015-01-01 NOTE — Telephone Encounter (Signed)
Prescription available, called patient, no answer 

## 2015-01-01 NOTE — Telephone Encounter (Signed)
Patient picked up Rx

## 2015-01-01 NOTE — Telephone Encounter (Signed)
Patient is calling requesting pain medication refill onHYDROcodone-acetaminophen (NORCO/VICODIN) 5-325 MG per tablet please advise?  

## 2015-01-06 ENCOUNTER — Other Ambulatory Visit: Payer: Self-pay | Admitting: *Deleted

## 2015-01-06 ENCOUNTER — Telehealth: Payer: Self-pay | Admitting: Orthopedic Surgery

## 2015-01-06 NOTE — Telephone Encounter (Signed)
Ok to proceed? Will she require a clearance?

## 2015-01-06 NOTE — Telephone Encounter (Signed)
Nail down a date  Probably needs to be 2 weeks from now  No pre op med clear needed

## 2015-01-06 NOTE — Telephone Encounter (Signed)
Patient called to relay she is ready to schedule total knee surgery for sometime over the next month; ?clearance needed?  Also, her insurance requires online pre-authorization.  Her Ph# is [phone]

## 2015-01-07 NOTE — Telephone Encounter (Signed)
Surgery scheduled, patient aware

## 2015-01-09 ENCOUNTER — Telehealth: Payer: Self-pay | Admitting: Orthopedic Surgery

## 2015-01-09 NOTE — Telephone Encounter (Signed)
Regarding  In-patient/admit surgery planned for 02/05/15 at [facility], ICD-10 M17.32, M25.562, Z98.99, S82.142P, contacted insurers:  requested pre-authorization via on-line [employer] portal, patient's primary insurer. Received pending Authorization# [id number]; awaiting approval.   Regarding secondary insurer Medicaid, no pre-authorization required, per online [employer] system.

## 2015-01-15 NOTE — Telephone Encounter (Signed)
As of 01/14/15, Status "Approved" per [facility] with same Authorization# [id number] for surgery as noted, CPT 27447, 02/05/15, for 2-day stay; if additional day(s) needed, hospital will be contacted to provide clinicals.

## 2015-01-17 ENCOUNTER — Other Ambulatory Visit: Payer: Self-pay

## 2015-01-17 DIAGNOSIS — Z8619 Personal history of other infectious and parasitic diseases: Secondary | ICD-10-CM

## 2015-01-23 DIAGNOSIS — I10 Essential (primary) hypertension: Secondary | ICD-10-CM | POA: Diagnosis not present

## 2015-01-23 DIAGNOSIS — E784 Other hyperlipidemia: Secondary | ICD-10-CM | POA: Diagnosis not present

## 2015-01-29 ENCOUNTER — Other Ambulatory Visit: Payer: Self-pay | Admitting: *Deleted

## 2015-01-29 DIAGNOSIS — M5410 Radiculopathy, site unspecified: Secondary | ICD-10-CM

## 2015-01-29 DIAGNOSIS — M5442 Lumbago with sciatica, left side: Secondary | ICD-10-CM

## 2015-01-29 MED ORDER — HYDROCODONE-ACETAMINOPHEN 5-325 MG PO TABS: 1.0000 | ORAL_TABLET | ORAL | Status: DC | PRN

## 2015-01-30 NOTE — Telephone Encounter (Signed)
PATIENT PICKED UP RX 

## 2015-01-31 ENCOUNTER — Encounter (HOSPITAL_COMMUNITY): Payer: Self-pay

## 2015-01-31 ENCOUNTER — Encounter (HOSPITAL_COMMUNITY)
Admission: RE | Admit: 2015-01-31 | Discharge: 2015-01-31 | Disposition: A | Discharge status: Home / Self Care | Payer: Commercial Managed Care - HMO | Source: Ambulatory Visit | Attending: Orthopedic Surgery | Admitting: Orthopedic Surgery

## 2015-01-31 DIAGNOSIS — M25562 Pain in left knee: Secondary | ICD-10-CM | POA: Diagnosis present

## 2015-01-31 DIAGNOSIS — M797 Fibromyalgia: Secondary | ICD-10-CM | POA: Diagnosis present

## 2015-01-31 DIAGNOSIS — Z825 Family history of asthma and other chronic lower respiratory diseases: Secondary | ICD-10-CM | POA: Diagnosis not present

## 2015-01-31 DIAGNOSIS — Z8619 Personal history of other infectious and parasitic diseases: Secondary | ICD-10-CM | POA: Diagnosis not present

## 2015-01-31 DIAGNOSIS — M179 Osteoarthritis of knee, unspecified: Secondary | ICD-10-CM | POA: Insufficient documentation

## 2015-01-31 DIAGNOSIS — M1712 Unilateral primary osteoarthritis, left knee: Secondary | ICD-10-CM | POA: Diagnosis present

## 2015-01-31 DIAGNOSIS — Z452 Encounter for adjustment and management of vascular access device: Secondary | ICD-10-CM | POA: Diagnosis not present

## 2015-01-31 DIAGNOSIS — Z96652 Presence of left artificial knee joint: Secondary | ICD-10-CM | POA: Diagnosis not present

## 2015-01-31 DIAGNOSIS — Z800 Family history of malignant neoplasm of digestive organs: Secondary | ICD-10-CM | POA: Diagnosis not present

## 2015-01-31 DIAGNOSIS — B182 Chronic viral hepatitis C: Secondary | ICD-10-CM | POA: Diagnosis present

## 2015-01-31 DIAGNOSIS — Z01818 Encounter for other preprocedural examination: Secondary | ICD-10-CM | POA: Diagnosis present

## 2015-01-31 DIAGNOSIS — K219 Gastro-esophageal reflux disease without esophagitis: Secondary | ICD-10-CM | POA: Diagnosis present

## 2015-01-31 DIAGNOSIS — Z471 Aftercare following joint replacement surgery: Secondary | ICD-10-CM | POA: Diagnosis not present

## 2015-01-31 DIAGNOSIS — J45909 Unspecified asthma, uncomplicated: Secondary | ICD-10-CM | POA: Diagnosis present

## 2015-01-31 DIAGNOSIS — B1920 Unspecified viral hepatitis C without hepatic coma: Secondary | ICD-10-CM | POA: Diagnosis not present

## 2015-01-31 DIAGNOSIS — F17210 Nicotine dependence, cigarettes, uncomplicated: Secondary | ICD-10-CM | POA: Diagnosis present

## 2015-01-31 DIAGNOSIS — I10 Essential (primary) hypertension: Secondary | ICD-10-CM | POA: Diagnosis present

## 2015-01-31 DIAGNOSIS — K7460 Unspecified cirrhosis of liver: Secondary | ICD-10-CM | POA: Diagnosis present

## 2015-01-31 HISTORY — DX: Fibromyalgia: M79.7

## 2015-01-31 LAB — CBC WITH DIFFERENTIAL/PLATELET
Basophils Absolute: 0 10*3/uL (ref 0.0–0.1)
Basophils Relative: 0 % (ref 0–1)
Eosinophils Absolute: 0.1 10*3/uL (ref 0.0–0.7)
Eosinophils Relative: 2 % (ref 0–5)
HCT: 38.2 % (ref 36.0–46.0)
Hemoglobin: 13.1 g/dL (ref 12.0–15.0)
Lymphocytes Relative: 25 % (ref 12–46)
Lymphs Abs: 1.6 10*3/uL (ref 0.7–4.0)
MCH: 31.3 pg (ref 26.0–34.0)
MCHC: 34.3 g/dL (ref 30.0–36.0)
MCV: 91.2 fL (ref 78.0–100.0)
Monocytes Absolute: 0.4 10*3/uL (ref 0.1–1.0)
Monocytes Relative: 7 % (ref 3–12)
Neutro Abs: 4.3 10*3/uL (ref 1.7–7.7)
Neutrophils Relative %: 66 % (ref 43–77)
Platelets: 177 10*3/uL (ref 150–400)
RBC: 4.19 MIL/uL (ref 3.87–5.11)
RDW: 14 % (ref 11.5–15.5)
WBC: 6.4 10*3/uL (ref 4.0–10.5)

## 2015-01-31 LAB — BASIC METABOLIC PANEL
Anion gap: 8 (ref 5–15)
BUN: 8 mg/dL (ref 6–20)
CO2: 25 mmol/L (ref 22–32)
Calcium: 9.9 mg/dL (ref 8.9–10.3)
Chloride: 104 mmol/L (ref 101–111)
Creatinine, Ser: 0.7 mg/dL (ref 0.44–1.00)
GFR calc Af Amer: >60 mL/min (ref >60)
GFR calc non Af Amer: >60 mL/min (ref >60)
Glucose, Bld: 121 mg/dL — ABNORMAL HIGH (ref 65–99)
Potassium: 3.7 mmol/L (ref 3.5–5.1)
Sodium: 137 mmol/L (ref 135–145)

## 2015-01-31 LAB — ABO/RH: ABO/RH(D): O POS

## 2015-01-31 LAB — APTT: aPTT: 26 seconds (ref 24–37)

## 2015-01-31 LAB — SURGICAL PCR SCREEN
MRSA, PCR: NEGATIVE
Staphylococcus aureus: NEGATIVE

## 2015-01-31 LAB — PROTIME-INR
INR: 1.07 (ref 0.00–1.49)
Prothrombin Time: 14.1 seconds (ref 11.6–15.2)

## 2015-01-31 NOTE — Pre-Procedure Instructions (Signed)
Patient states she did not go to Total knee class. Offered emmi to be sent to computer of smartphone and she does not have one. Offered emmi to be watched here and she states, "i have things to do'.

## 2015-01-31 NOTE — Pre-Procedure Instructions (Signed)
Patient given information to sign up for my chart at home. 

## 2015-01-31 NOTE — Patient Instructions (Signed)
Colleen Lee  01/31/2015     @PREFPERIOPPHARMACY @   Your procedure is scheduled on 02/05/2015   Report to [facility] at  3  A.M.  Call this number if you have problems the morning of surgery:  [phone]   Remember:  Do not eat food or drink liquids after midnight.  Take these medicines the morning of surgery with A SIP OF WATER  Amlodipine, nexium, hydrocodone, lisinopril, zofran, ultram, mycobutin   Do not wear jewelry, make-up or nail polish.  Do not wear lotions, powders, or perfumes.    Do not shave 48 hours prior to surgery.  Men may shave face and neck.  Do not bring valuables to the hospital.  [facility] is not responsible for any belongings or valuables.  Contacts, dentures or bridgework may not be worn into surgery.  Leave your suitcase in the car.  After surgery it may be brought to your room.  For patients admitted to the hospital, discharge time will be determined by your treatment team.  Patients discharged the day of surgery will not be allowed to drive home.   Name and phone number of your driver:   family Special instructions:  none Please read over the following fact sheets that you were given. Pain Booklet, Coughing and Deep Breathing, Surgical Site Infection Prevention, Anesthesia Post-op Instructions and Care and Recovery After Surgery      Total Knee Replacement Total knee replacement is a procedure to replace your knee joint with an artificial knee joint (prosthetic knee joint). The purpose of this surgery is to reduce pain and improve your knee function. LET [facility] CARE PROVIDER KNOW ABOUT:   Any allergies you have.  All medicines you are taking, including vitamins, herbs, eye drops, creams, and over-the-counter medicines.  Previous problems you or members of your family have had with the use of anesthetics.  Any blood disorders you have.  Previous surgeries you have had.  Medical conditions you have. RISKS AND COMPLICATIONS   Generally, total knee replacement is a safe procedure. However, problems can occur and include:  Loss of range of motion of the knee or instability.  Loosening of the prosthesis.  Infection.  Persistent pain. BEFORE THE PROCEDURE   Do not eat or drink anything after midnight on the night before the procedure or as directed by your health care provider.  Ask your health care provider about changing or stopping your regular medicines. This is especially important if you are taking diabetes medicines or blood thinners. PROCEDURE   Just before the procedure, you will receive medicine that will make you drowsy (sedative). This will be given through a tube that is inserted into one of your veins (IV tube).  Then you will be given one of the following:  A medicine injected into your spine that numbs your body below the waist (spinal anesthetic).  A medicine that makes you fall asleep (general anesthetic).  You may also receive medicine to block feeling in your leg (nerve block) to help ease pain after surgery.  An incision will be made in your knee. Your surgeon will take out any damaged cartilage and bone by sawing off the damaged surfaces.  The surgeon will then put a new metal liner over the sawed-off portion of your thigh bone (femur) and a plastic liner over the sawed-off portion of one of the bones of your lower leg (tibia). This is to restore alignment and function to your knee. A  plastic piece is often used to restore the surface of your knee cap. AFTER THE PROCEDURE   You will be taken to the recovery area.  You may have drainage tubes to drain excess fluid from your knee. These tubes attach to a device that removes these fluids.  Once you are awake, stable, and taking fluids well, you will be taken to your hospital room.  You will receive physical therapy as prescribed by your health care provider.  Your surgeon may recommend that you spend time (usually an additional  10-14 days) in an extended-care facility to help you begin walking again and improve your range of motion before you go home.  You may also be prescribed blood-thinning medicine to decrease your risk of developing blood clots in your leg. Document Released: 10/25/2000 Document Revised: 12/03/2013 Document Reviewed: 08/29/2011 [facility] Patient Information 2015 [place], [place]. This information is not intended to replace advice given to you by your health care provider. Make sure you discuss any questions you have with your health care provider. PATIENT INSTRUCTIONS POST-ANESTHESIA  IMMEDIATELY FOLLOWING SURGERY:  Do not drive or operate machinery for the first twenty four hours after surgery.  Do not make any important decisions for twenty four hours after surgery or while taking narcotic pain medications or sedatives.  If you develop intractable nausea and vomiting or a severe headache please notify your doctor immediately.  FOLLOW-UP:  Please make an appointment with your surgeon as instructed. You do not need to follow up with anesthesia unless specifically instructed to do so.  WOUND CARE INSTRUCTIONS (if applicable):  Keep a dry clean dressing on the anesthesia/puncture wound site if there is drainage.  Once the wound has quit draining you may leave it open to air.  Generally you should leave the bandage intact for twenty four hours unless there is drainage.  If the epidural site drains for more than 36-48 hours please call the anesthesia department.  QUESTIONS?:  Please feel free to call your physician or the hospital operator if you have any questions, and they will be happy to assist you.

## 2015-02-03 LAB — PREPARE RBC (CROSSMATCH)

## 2015-02-04 MED ORDER — TRANEXAMIC ACID 1000 MG/10ML IV SOLN
1000.0000 mg | INTRAVENOUS | Status: AC
  Administered 2015-02-05: 1000 mg via INTRAVENOUS
  Filled 2015-02-04: qty 10

## 2015-02-04 NOTE — H&P (Signed)
TOTAL KNEE ADMISSION H&P  Patient is being admitted for left total knee arthroplasty.  Subjective:  Chief Complaint:left knee pain.  HPI: Colleen Lee, 59 y.o. female, has a history of pain and functional disability in the left knee due to trauma and has failed non-surgical conservative treatments for greater than 12 weeks to includeNSAID's and/or analgesics, corticosteriod injections, flexibility and strengthening excercises, use of assistive devices and activity modification.  Onset of symptoms was gradual, starting over 1 year ago after tibial plateau fracture  years ago with gradually worsening course since that time. The patient noted prior procedures on the knee to include  arthroscopy on the left knee(s).  Patient currently rates pain in the left knee(s) at 7 out of 10 with activity. Patient has night pain, worsening of pain with activity and weight bearing, pain that interferes with activities of daily living, pain with passive range of motion, crepitus and joint swelling.  Patient has evidence of subchondral sclerosis and joint space narrowing by imaging studies. This patient has had No other prior procedure. There is no active infection.  Patient Active Problem List   Diagnosis Date Noted  . History of Helicobacter pylori infection 10/30/2014  . Chronic [name] with cirrhosis 04/11/2014  . De Quervain's disease (radial styloid tenosynovitis) 12/25/2013  . Anorexia 11/21/2012  . FH: colon cancer 11/21/2012  . Early satiety 10/25/2012  . Bowel habit changes 10/25/2012  . Abdominal pain, epigastric 10/25/2012  . Abdominal bloating 10/25/2012  . Constipation 10/25/2012  . Abnormal weight loss 10/25/2012  . Chronic viral [name] 10/25/2012  . Radicular pain of left lower extremity 09/28/2012  . Back pain 09/28/2012  . Sciatica 08/10/2011  . S/P arthroscopy of left knee 08/10/2011  . Tibial plateau fracture 08/10/2011  . Pain in joint, lower leg 02/12/2011  . Stiffness of  joint, not elsewhere classified, lower leg 02/12/2011  . 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 Meniscus, medial, derangement 12/29/2010  . CLOSED FRACTURE OF UPPER END OF TIBIA 08/12/2010   Past Medical History  Diagnosis Date  . Hypertension   . Fibroids   . [name]     HCV RNA positive 09/2012  . GERD (gastroesophageal reflux disease)   . H. pylori infection 2014    treated with pylera  . Asthma   . PONV (postoperative nausea and vomiting)     pt thinks maybe once she had N&V  . Cirrhosis     Metavir score F4 on elastography 2015  . Fibromyalgia     Past Surgical History  Procedure Laterality Date  . Knee surgery      left knee  . Colonoscopy  01/18/2008    UZP:JDVTYU rectum.  Long redundant colon, a diminutive sigmoid polyp status post cold biopsy removed. Hyperplastic polyp. Repeat colonoscopy June 2014 due to family history of colon cancer  . Esophagogastroduodenoscopy  01/18/2008    RMR: Normal esophagus, normal  stomach  . Colonoscopy with esophagogastroduodenoscopy (egd) N/A 11/02/2012    UKK:UOKDVGDN gastric mucosa of doubtful, +H.pylori. Incomplete colonoscopy due to patient unable to tolerate exam, proximal colon seen. Patient refused ACBE.  . Colonoscopy with propofol N/A 03/08/2013    Dr. Jena Gauss: colonic polyp-removed as scribed above. Internal Hemorrhoids. Pathology did not reveal any colonic tissue, only mucus. SURVEILLANCE DUE Aug 2019  . Polypectomy N/A 03/08/2013    Procedure: POLYPECTOMY;  Surgeon: Corbin Ade, MD;  Location: [facility];  Service: Endoscopy;  Laterality: N/A;  . Multiple extractions with alveoloplasty N/A 10/15/2013  Procedure: MULTIPLE EXTRACTION WITH ALVEOLOPLASTY;  Surgeon: Gae Bon, DDS;  Location: [facility];  Service: Oral Surgery;  Laterality: N/A;    No prescriptions prior to admission   Allergies  Allergen Reactions  . Penicillins Rash    History  Substance Use Topics  . Smoking status: Current Every Day Smoker -- 1.00 packs/day  for 40 years    Types: Cigarettes  . Smokeless tobacco: Not on file     Comment: Smokes one pack of cigarettes daily  . Alcohol Use: No     Comment: former, last Jan 2014    Family History  Problem Relation Age of Onset  . Colon cancer Brother        . Asthma      FH  . Multiple myeloma Brother   . Liver cancer Sister   . Prostate cancer Brother   . Pancreatic cancer Brother   . Cancer Other     niece, age 38, primary unknown     Review of Systems  Musculoskeletal: Positive for back pain and joint pain.    Objective:  Physical Exam  Vitals reviewed. Constitutional: She is oriented to person, place, and time. She appears well-developed and well-nourished. No distress.  HENT:  Head: Normocephalic.  Eyes: Pupils are equal, round, and reactive to light.  Neck: Normal range of motion. Neck supple. No JVD present. No tracheal deviation present. No thyromegaly present.  Cardiovascular: Normal rate.   Respiratory: Effort normal. No respiratory distress. She has no wheezes.  GI: Soft. She exhibits no distension.  Musculoskeletal:  Right knee no tenderness or swelling, full range of motion, normal stability, motor exam normal.   Left knee medial joint line tenderness small effusion decreased range of motion valgus alignment, knee stable motor exam normal  Upper extremities normal  Lymphadenopathy:    She has no cervical adenopathy.  Neurological: She is alert and oriented to person, place, and time. She has normal reflexes.  Skin: Skin is warm and dry. No rash noted. She is not diaphoretic. No erythema. No pallor.  Psychiatric: She has a normal mood and affect. Her behavior is normal. Judgment and thought content normal.    Vital signs in last 24 hours:    Labs:   Estimated body mass index is 24.22 kg/(m^2) as calculated from the following:   Height as of 12/17/14: $RemoveBef'5\' 6"'zxgldvqSJy$  (1.676 m).   Weight as of 12/17/14: 150 lb (68.04 kg).   Imaging Review Plain radiographs  demonstrate moderate degenerative joint disease of the left knee(s). The overall alignment ismild valgus. The bone quality appears to be good for age and reported activity level.  Assessment/Plan:  End stage arthritis, left knee   The patient history, physical examination, clinical judgment of the provider and imaging studies are consistent with end stage degenerative joint disease of the left knee(s) and total knee arthroplasty is deemed medically necessary. The treatment options including medical management, injection therapy arthroscopy and arthroplasty were discussed at length. The risks and benefits of total knee arthroplasty were presented and reviewed. The risks due to aseptic loosening, infection, stiffness, patella tracking problems, thromboembolic complications and other imponderables were discussed. The patient acknowledged the explanation, agreed to proceed with the plan and consent was signed. Patient is being admitted for inpatient treatment for surgery, pain control, PT, OT, prophylactic antibiotics, VTE prophylaxis, progressive ambulation and ADL's and discharge planning. The patient is planning to be discharged home with home health services

## 2015-02-05 ENCOUNTER — Inpatient Hospital Stay (HOSPITAL_COMMUNITY): Payer: Commercial Managed Care - HMO | Admitting: Anesthesiology

## 2015-02-05 ENCOUNTER — Inpatient Hospital Stay (HOSPITAL_COMMUNITY): Payer: Commercial Managed Care - HMO

## 2015-02-05 ENCOUNTER — Encounter (HOSPITAL_COMMUNITY)
Admission: RE | Disposition: A | Discharge status: Home / Self Care | Payer: Self-pay | Source: Ambulatory Visit | Attending: Orthopedic Surgery

## 2015-02-05 ENCOUNTER — Encounter (HOSPITAL_COMMUNITY): Payer: Self-pay | Admitting: *Deleted

## 2015-02-05 ENCOUNTER — Inpatient Hospital Stay (HOSPITAL_COMMUNITY)
Admission: RE | Admit: 2015-02-05 | Discharge: 2015-02-08 | DRG: 470 | Disposition: A | Discharge status: Home / Self Care | Payer: Commercial Managed Care - HMO | Source: Ambulatory Visit | Attending: Orthopedic Surgery | Admitting: Orthopedic Surgery

## 2015-02-05 DIAGNOSIS — M179 Osteoarthritis of knee, unspecified: Secondary | ICD-10-CM | POA: Diagnosis present

## 2015-02-05 DIAGNOSIS — K219 Gastro-esophageal reflux disease without esophagitis: Secondary | ICD-10-CM | POA: Diagnosis present

## 2015-02-05 DIAGNOSIS — Z800 Family history of malignant neoplasm of digestive organs: Secondary | ICD-10-CM

## 2015-02-05 DIAGNOSIS — Z825 Family history of asthma and other chronic lower respiratory diseases: Secondary | ICD-10-CM | POA: Diagnosis not present

## 2015-02-05 DIAGNOSIS — I10 Essential (primary) hypertension: Secondary | ICD-10-CM | POA: Diagnosis present

## 2015-02-05 DIAGNOSIS — Z95828 Presence of other vascular implants and grafts: Secondary | ICD-10-CM

## 2015-02-05 DIAGNOSIS — F17210 Nicotine dependence, cigarettes, uncomplicated: Secondary | ICD-10-CM | POA: Diagnosis present

## 2015-02-05 DIAGNOSIS — M25562 Pain in left knee: Secondary | ICD-10-CM | POA: Diagnosis present

## 2015-02-05 DIAGNOSIS — M1712 Unilateral primary osteoarthritis, left knee: Secondary | ICD-10-CM | POA: Diagnosis present

## 2015-02-05 DIAGNOSIS — J45909 Unspecified asthma, uncomplicated: Secondary | ICD-10-CM | POA: Diagnosis present

## 2015-02-05 DIAGNOSIS — B182 Chronic viral hepatitis C: Secondary | ICD-10-CM | POA: Diagnosis present

## 2015-02-05 DIAGNOSIS — M797 Fibromyalgia: Secondary | ICD-10-CM | POA: Diagnosis present

## 2015-02-05 DIAGNOSIS — K7460 Unspecified cirrhosis of liver: Secondary | ICD-10-CM | POA: Diagnosis present

## 2015-02-05 DIAGNOSIS — Z09 Encounter for follow-up examination after completed treatment for conditions other than malignant neoplasm: Secondary | ICD-10-CM

## 2015-02-05 DIAGNOSIS — M1710 Unilateral primary osteoarthritis, unspecified knee: Secondary | ICD-10-CM | POA: Diagnosis present

## 2015-02-05 HISTORY — PX: TOTAL KNEE ARTHROPLASTY: SHX125

## 2015-02-05 LAB — H. PYLORI BREATH TEST: H. pylori Breath Test: NOT DETECTED

## 2015-02-05 SURGERY — ARTHROPLASTY, KNEE, TOTAL
Anesthesia: Monitor Anesthesia Care | Site: Knee | Laterality: Left

## 2015-02-05 MED ORDER — MIDAZOLAM HCL 2 MG/2ML IJ SOLN
1.0000 mg | INTRAMUSCULAR | Status: DC | PRN
Start: 2015-02-05 — End: 2015-02-05
  Administered 2015-02-05 (×3): 2 mg via INTRAVENOUS

## 2015-02-05 MED ORDER — LIDOCAINE HCL (PF) 1 % IJ SOLN
INTRAMUSCULAR | Status: AC
  Filled 2015-02-05: qty 5

## 2015-02-05 MED ORDER — PROPOFOL INFUSION 10 MG/ML OPTIME
INTRAVENOUS | Status: DC | PRN
  Administered 2015-02-05: 10:00:00 via INTRAVENOUS
  Administered 2015-02-05: 40 ug/kg/min via INTRAVENOUS

## 2015-02-05 MED ORDER — BUPIVACAINE IN DEXTROSE 0.75-8.25 % IT SOLN
INTRATHECAL | Status: AC
  Filled 2015-02-05: qty 2

## 2015-02-05 MED ORDER — VANCOMYCIN HCL IN DEXTROSE 1-5 GM/200ML-% IV SOLN
1000.0000 mg | Freq: Two times a day (BID) | INTRAVENOUS | Status: AC
  Administered 2015-02-05: 1000 mg via INTRAVENOUS
  Filled 2015-02-05: qty 200

## 2015-02-05 MED ORDER — PROPOFOL 10 MG/ML IV BOLUS
INTRAVENOUS | Status: AC
  Filled 2015-02-05: qty 20

## 2015-02-05 MED ORDER — FENTANYL CITRATE (PF) 250 MCG/5ML IJ SOLN
INTRAMUSCULAR | Status: AC
  Filled 2015-02-05: qty 5

## 2015-02-05 MED ORDER — METHOCARBAMOL 500 MG PO TABS
500.0000 mg | ORAL_TABLET | Freq: Four times a day (QID) | ORAL | Status: DC | PRN
  Administered 2015-02-05 – 2015-02-07 (×3): 500 mg via ORAL
  Filled 2015-02-05 (×4): qty 1

## 2015-02-05 MED ORDER — BUPIVACAINE LIPOSOME 1.3 % IJ SUSP
INTRAMUSCULAR | Status: AC
  Filled 2015-02-05: qty 20

## 2015-02-05 MED ORDER — PHENYLEPHRINE 40 MCG/ML (10ML) SYRINGE FOR IV PUSH (FOR BLOOD PRESSURE SUPPORT)
PREFILLED_SYRINGE | INTRAVENOUS | Status: AC
  Filled 2015-02-05: qty 10

## 2015-02-05 MED ORDER — PREGABALIN 50 MG PO CAPS
ORAL_CAPSULE | ORAL | Status: AC
  Filled 2015-02-05: qty 1

## 2015-02-05 MED ORDER — LACTATED RINGERS IV SOLN
INTRAVENOUS | Status: DC
  Administered 2015-02-05: 1000 mL via INTRAVENOUS
  Administered 2015-02-05: 11:00:00 via INTRAVENOUS

## 2015-02-05 MED ORDER — EPINEPHRINE HCL 0.1 MG/ML IJ SOSY
PREFILLED_SYRINGE | INTRAMUSCULAR | Status: DC | PRN
  Administered 2015-02-05: .1 mL via INTRAVENOUS

## 2015-02-05 MED ORDER — NICOTINE 21 MG/24HR TD PT24
21.0000 mg | MEDICATED_PATCH | Freq: Every day | TRANSDERMAL | Status: DC
  Administered 2015-02-05 – 2015-02-08 (×4): 21 mg via TRANSDERMAL
  Filled 2015-02-05 (×5): qty 1

## 2015-02-05 MED ORDER — ACETAMINOPHEN 650 MG RE SUPP: 650.0000 mg | Freq: Four times a day (QID) | RECTAL | Status: DC | PRN

## 2015-02-05 MED ORDER — FENTANYL CITRATE (PF) 250 MCG/5ML IJ SOLN
INTRAMUSCULAR | Status: DC | PRN
  Administered 2015-02-05: 25 ug via INTRAVENOUS
  Administered 2015-02-05: 50 ug via INTRAVENOUS
  Administered 2015-02-05: 25 ug via INTRAVENOUS
  Administered 2015-02-05: 25 ug via INTRATHECAL
  Administered 2015-02-05: 50 ug via INTRAVENOUS
  Administered 2015-02-05: 25 ug via INTRAVENOUS

## 2015-02-05 MED ORDER — FENTANYL CITRATE (PF) 100 MCG/2ML IJ SOLN
25.0000 ug | INTRAMUSCULAR | Status: AC
  Administered 2015-02-05 (×2): 25 ug via INTRAVENOUS

## 2015-02-05 MED ORDER — METOCLOPRAMIDE HCL 5 MG/ML IJ SOLN: 5.0000 mg | Freq: Three times a day (TID) | INTRAMUSCULAR | Status: DC | PRN

## 2015-02-05 MED ORDER — SODIUM CHLORIDE 0.9 % IR SOLN
Status: DC | PRN
  Administered 2015-02-05: 3000 mL

## 2015-02-05 MED ORDER — FENTANYL CITRATE (PF) 100 MCG/2ML IJ SOLN: 25.0000 ug | INTRAMUSCULAR | Status: DC | PRN

## 2015-02-05 MED ORDER — OXYCODONE HCL 5 MG PO TABS
5.0000 mg | ORAL_TABLET | Freq: Once | ORAL | Status: AC
  Administered 2015-02-05: 5 mg via ORAL

## 2015-02-05 MED ORDER — BUPIVACAINE LIPOSOME 1.3 % IJ SUSP
20.0000 mL | Freq: Once | INTRAMUSCULAR | Status: DC
  Filled 2015-02-05: qty 20

## 2015-02-05 MED ORDER — SENNOSIDES-DOCUSATE SODIUM 8.6-50 MG PO TABS
1.0000 | ORAL_TABLET | Freq: Every evening | ORAL | Status: DC | PRN
  Administered 2015-02-06: 1 via ORAL
  Filled 2015-02-05: qty 1

## 2015-02-05 MED ORDER — CELECOXIB 400 MG PO CAPS
ORAL_CAPSULE | ORAL | Status: AC
  Filled 2015-02-05: qty 1

## 2015-02-05 MED ORDER — SODIUM CHLORIDE 0.9 % IJ SOLN
INTRAMUSCULAR | Status: AC
  Filled 2015-02-05: qty 40

## 2015-02-05 MED ORDER — MIDAZOLAM HCL 2 MG/2ML IJ SOLN
INTRAMUSCULAR | Status: AC
  Filled 2015-02-05: qty 2

## 2015-02-05 MED ORDER — POTASSIUM CHLORIDE CRYS ER 20 MEQ PO TBCR
20.0000 meq | EXTENDED_RELEASE_TABLET | Freq: Every day | ORAL | Status: DC
  Administered 2015-02-05 – 2015-02-08 (×4): 20 meq via ORAL
  Filled 2015-02-05 (×4): qty 1

## 2015-02-05 MED ORDER — BUPIVACAINE-EPINEPHRINE (PF) 0.5% -1:200000 IJ SOLN
INTRAMUSCULAR | Status: AC
  Filled 2015-02-05: qty 30

## 2015-02-05 MED ORDER — PANTOPRAZOLE SODIUM 40 MG PO TBEC
40.0000 mg | DELAYED_RELEASE_TABLET | Freq: Every day | ORAL | Status: DC
  Administered 2015-02-05 – 2015-02-08 (×4): 40 mg via ORAL
  Filled 2015-02-05 (×4): qty 1

## 2015-02-05 MED ORDER — SODIUM CHLORIDE 0.9 % IV SOLN
INTRAVENOUS | Status: DC | PRN
  Administered 2015-02-05: 60 mL

## 2015-02-05 MED ORDER — METOCLOPRAMIDE HCL 10 MG PO TABS: 5.0000 mg | ORAL_TABLET | Freq: Three times a day (TID) | ORAL | Status: DC | PRN

## 2015-02-05 MED ORDER — OXYCODONE HCL 5 MG PO TABS
ORAL_TABLET | ORAL | Status: AC
  Filled 2015-02-05: qty 1

## 2015-02-05 MED ORDER — ONDANSETRON HCL 4 MG/2ML IJ SOLN: 4.0000 mg | Freq: Four times a day (QID) | INTRAMUSCULAR | Status: DC | PRN

## 2015-02-05 MED ORDER — ONDANSETRON HCL 4 MG/2ML IJ SOLN
INTRAMUSCULAR | Status: AC
  Filled 2015-02-05: qty 2

## 2015-02-05 MED ORDER — OXYCODONE-ACETAMINOPHEN 5-325 MG PO TABS
1.0000 | ORAL_TABLET | ORAL | Status: DC
  Administered 2015-02-05 – 2015-02-08 (×18): 1 via ORAL
  Filled 2015-02-05 (×18): qty 1

## 2015-02-05 MED ORDER — ONDANSETRON HCL 4 MG/2ML IJ SOLN: 4.0000 mg | Freq: Once | INTRAMUSCULAR | Status: DC | PRN

## 2015-02-05 MED ORDER — MEGESTROL ACETATE 40 MG PO TABS
40.0000 mg | ORAL_TABLET | Freq: Every day | ORAL | Status: DC
  Administered 2015-02-05 – 2015-02-08 (×4): 40 mg via ORAL
  Filled 2015-02-05 (×6): qty 1

## 2015-02-05 MED ORDER — SODIUM CHLORIDE 0.9 % IJ SOLN: 10.0000 mL | INTRAMUSCULAR | Status: DC | PRN

## 2015-02-05 MED ORDER — DEXAMETHASONE SODIUM PHOSPHATE 4 MG/ML IJ SOLN
10.0000 mg | Freq: Once | INTRAMUSCULAR | Status: AC
  Administered 2015-02-06: 10 mg via INTRAVENOUS
  Filled 2015-02-05: qty 3

## 2015-02-05 MED ORDER — EPHEDRINE SULFATE 50 MG/ML IJ SOLN
INTRAMUSCULAR | Status: AC
  Filled 2015-02-05: qty 1

## 2015-02-05 MED ORDER — KETOROLAC TROMETHAMINE 15 MG/ML IJ SOLN
15.0000 mg | Freq: Four times a day (QID) | INTRAMUSCULAR | Status: AC
  Administered 2015-02-05 – 2015-02-06 (×4): 15 mg via INTRAVENOUS
  Filled 2015-02-05 (×4): qty 1

## 2015-02-05 MED ORDER — LISINOPRIL 10 MG PO TABS
20.0000 mg | ORAL_TABLET | Freq: Every day | ORAL | Status: DC
  Administered 2015-02-05 – 2015-02-08 (×4): 20 mg via ORAL
  Filled 2015-02-05 (×4): qty 2

## 2015-02-05 MED ORDER — CHLORHEXIDINE GLUCONATE 4 % EX LIQD: 60.0000 mL | Freq: Once | CUTANEOUS | Status: DC

## 2015-02-05 MED ORDER — FLEET ENEMA 7-19 GM/118ML RE ENEM: 1.0000 | ENEMA | Freq: Once | RECTAL | Status: AC | PRN

## 2015-02-05 MED ORDER — DOCUSATE SODIUM 100 MG PO CAPS
100.0000 mg | ORAL_CAPSULE | Freq: Two times a day (BID) | ORAL | Status: DC
  Administered 2015-02-05 – 2015-02-08 (×6): 100 mg via ORAL
  Filled 2015-02-05 (×6): qty 1

## 2015-02-05 MED ORDER — ONDANSETRON HCL 4 MG PO TABS: 4.0000 mg | ORAL_TABLET | Freq: Four times a day (QID) | ORAL | Status: DC | PRN

## 2015-02-05 MED ORDER — BUPIVACAINE-EPINEPHRINE (PF) 0.5% -1:200000 IJ SOLN
INTRAMUSCULAR | Status: DC | PRN
  Administered 2015-02-05: 30 mL

## 2015-02-05 MED ORDER — VANCOMYCIN HCL IN DEXTROSE 1-5 GM/200ML-% IV SOLN
1000.0000 mg | INTRAVENOUS | Status: AC
  Administered 2015-02-05: 1000 mg via INTRAVENOUS
  Filled 2015-02-05: qty 200

## 2015-02-05 MED ORDER — PROPOFOL 10 MG/ML IV BOLUS
INTRAVENOUS | Status: DC | PRN
  Administered 2015-02-05 (×3): 10 mg via INTRAVENOUS

## 2015-02-05 MED ORDER — EPHEDRINE SULFATE 50 MG/ML IJ SOLN
INTRAMUSCULAR | Status: DC | PRN
  Administered 2015-02-05: 5 mg via INTRAVENOUS

## 2015-02-05 MED ORDER — DIPHENHYDRAMINE HCL 12.5 MG/5ML PO ELIX: 12.5000 mg | ORAL_SOLUTION | ORAL | Status: DC | PRN

## 2015-02-05 MED ORDER — PREGABALIN 50 MG PO CAPS
50.0000 mg | ORAL_CAPSULE | Freq: Once | ORAL | Status: AC
  Administered 2015-02-05: 50 mg via ORAL

## 2015-02-05 MED ORDER — OXYCODONE HCL 5 MG PO TABS
5.0000 mg | ORAL_TABLET | ORAL | Status: DC | PRN
  Administered 2015-02-05 – 2015-02-08 (×4): 5 mg via ORAL
  Filled 2015-02-05 (×4): qty 1

## 2015-02-05 MED ORDER — FENTANYL CITRATE (PF) 100 MCG/2ML IJ SOLN
25.0000 ug | Freq: Once | INTRAMUSCULAR | Status: AC
  Administered 2015-02-05: 25 ug via INTRAVENOUS

## 2015-02-05 MED ORDER — SODIUM CHLORIDE 0.9 % IV SOLN
INTRAVENOUS | Status: DC
  Administered 2015-02-05 – 2015-02-06 (×2): via INTRAVENOUS

## 2015-02-05 MED ORDER — FENTANYL CITRATE (PF) 100 MCG/2ML IJ SOLN
INTRAMUSCULAR | Status: AC
  Filled 2015-02-05: qty 2

## 2015-02-05 MED ORDER — HYDROMORPHONE HCL 1 MG/ML IJ SOLN
0.5000 mg | INTRAMUSCULAR | Status: DC | PRN
  Administered 2015-02-05 – 2015-02-06 (×9): 0.5 mg via INTRAVENOUS
  Filled 2015-02-05 (×9): qty 1

## 2015-02-05 MED ORDER — MENTHOL 3 MG MT LOZG: 1.0000 | LOZENGE | OROMUCOSAL | Status: DC | PRN

## 2015-02-05 MED ORDER — ASPIRIN EC 325 MG PO TBEC
325.0000 mg | DELAYED_RELEASE_TABLET | Freq: Two times a day (BID) | ORAL | Status: DC
  Administered 2015-02-05 – 2015-02-08 (×6): 325 mg via ORAL
  Filled 2015-02-05 (×6): qty 1

## 2015-02-05 MED ORDER — MIDAZOLAM HCL 2 MG/2ML IJ SOLN
INTRAMUSCULAR | Status: AC
Start: 2015-02-05 — End: 2015-02-05
  Filled 2015-02-05: qty 2

## 2015-02-05 MED ORDER — ONDANSETRON HCL 4 MG/2ML IJ SOLN
4.0000 mg | Freq: Once | INTRAMUSCULAR | Status: AC
  Administered 2015-02-05: 4 mg via INTRAVENOUS

## 2015-02-05 MED ORDER — CELECOXIB 400 MG PO CAPS
400.0000 mg | ORAL_CAPSULE | Freq: Once | ORAL | Status: AC
  Administered 2015-02-05: 400 mg via ORAL

## 2015-02-05 MED ORDER — BISACODYL 5 MG PO TBEC: 5.0000 mg | DELAYED_RELEASE_TABLET | Freq: Every day | ORAL | Status: DC | PRN

## 2015-02-05 MED ORDER — METHOCARBAMOL 1000 MG/10ML IJ SOLN
500.0000 mg | Freq: Four times a day (QID) | INTRAVENOUS | Status: DC | PRN
  Filled 2015-02-05: qty 5

## 2015-02-05 MED ORDER — BUPIVACAINE IN DEXTROSE 0.75-8.25 % IT SOLN
INTRATHECAL | Status: DC | PRN
  Administered 2015-02-05: 15 mg via INTRATHECAL

## 2015-02-05 MED ORDER — ONDANSETRON HCL 4 MG/2ML IJ SOLN: 4.0000 mg | Freq: Once | INTRAMUSCULAR | Status: DC

## 2015-02-05 MED ORDER — ALUM & MAG HYDROXIDE-SIMETH 200-200-20 MG/5ML PO SUSP: 30.0000 mL | ORAL | Status: DC | PRN

## 2015-02-05 MED ORDER — CELECOXIB 100 MG PO CAPS
200.0000 mg | ORAL_CAPSULE | Freq: Two times a day (BID) | ORAL | Status: DC
  Administered 2015-02-05 – 2015-02-08 (×6): 200 mg via ORAL
  Filled 2015-02-05 (×7): qty 2

## 2015-02-05 MED ORDER — METHOCARBAMOL 1000 MG/10ML IJ SOLN
500.0000 mg | Freq: Once | INTRAVENOUS | Status: AC
  Administered 2015-02-05: 500 mg via INTRAVENOUS
  Filled 2015-02-05: qty 5

## 2015-02-05 MED ORDER — PHENOL 1.4 % MT LIQD: 1.0000 | OROMUCOSAL | Status: DC | PRN

## 2015-02-05 MED ORDER — ACETAMINOPHEN 325 MG PO TABS
650.0000 mg | ORAL_TABLET | Freq: Four times a day (QID) | ORAL | Status: DC | PRN
Start: 2015-02-05 — End: 2015-02-08

## 2015-02-05 MED ORDER — SODIUM CHLORIDE 0.9 % IJ SOLN
10.0000 mL | Freq: Two times a day (BID) | INTRAMUSCULAR | Status: DC
  Administered 2015-02-05 – 2015-02-07 (×3): 20 mL

## 2015-02-05 SURGICAL SUPPLY — 69 items
BAG HAMPER (MISCELLANEOUS) ×3 IMPLANT
BANDAGE ESMARK 6X9 LF (GAUZE/BANDAGES/DRESSINGS) ×1 IMPLANT
BIT DRILL 3.2X128 (BIT) IMPLANT
BIT DRILL 3.2X128MM (BIT)
BLADE HEX COATED 2.75 (ELECTRODE) ×3 IMPLANT
BNDG ESMARK 6X9 LF (GAUZE/BANDAGES/DRESSINGS) ×3
BOWL SMART MIX CTS (DISPOSABLE) IMPLANT
CAP KNEE TOTAL 3 SIGMA ×3 IMPLANT
CEMENT HV SMART SET (Cement) ×6 IMPLANT
CLOTH BEACON ORANGE TIMEOUT ST (SAFETY) ×3 IMPLANT
COOLER CRYO CUFF IC AND MOTOR (MISCELLANEOUS) ×3 IMPLANT
COVER LIGHT HANDLE STERIS (MISCELLANEOUS) ×6 IMPLANT
COVER PROBE W GEL 5X96 (DRAPES) ×3 IMPLANT
CUFF CRYO KNEE LG 20X31 COOLER (ORTHOPEDIC SUPPLIES) ×3 IMPLANT
CUFF TOURNIQUET SINGLE 34IN LL (TOURNIQUET CUFF) ×3 IMPLANT
CUFF TOURNIQUET SINGLE 44IN (TOURNIQUET CUFF) IMPLANT
DECANTER SPIKE VIAL GLASS SM (MISCELLANEOUS) ×3 IMPLANT
DRAPE BACK TABLE (DRAPES) ×3 IMPLANT
DRAPE EXTREMITY T 121X128X90 (DRAPE) ×3 IMPLANT
DRSG MEPILEX BORDER 4X12 (GAUZE/BANDAGES/DRESSINGS) ×3 IMPLANT
DURAPREP 26ML APPLICATOR (WOUND CARE) ×6 IMPLANT
ELECT REM PT RETURN 9FT ADLT (ELECTROSURGICAL) ×3
ELECTRODE REM PT RTRN 9FT ADLT (ELECTROSURGICAL) ×1 IMPLANT
EVACUATOR 3/16  PVC DRAIN (DRAIN) ×2
EVACUATOR 3/16 PVC DRAIN (DRAIN) ×1 IMPLANT
GLOVE BIO SURGEON STRL SZ 6.5 (GLOVE) ×6 IMPLANT
GLOVE BIO SURGEONS STRL SZ 6.5 (GLOVE) ×3
GLOVE BIOGEL PI IND STRL 7.0 (GLOVE) ×4 IMPLANT
GLOVE BIOGEL PI IND STRL 7.5 (GLOVE) ×1 IMPLANT
GLOVE BIOGEL PI INDICATOR 7.0 (GLOVE) ×8
GLOVE BIOGEL PI INDICATOR 7.5 (GLOVE) ×2
GLOVE OPTIFIT SS 8.0 STRL (GLOVE) ×3 IMPLANT
GLOVE SKINSENSE NS SZ8.0 LF (GLOVE) ×4
GLOVE SKINSENSE STRL SZ8.0 LF (GLOVE) ×2 IMPLANT
GLOVE SS N UNI LF 8.5 STRL (GLOVE) ×3 IMPLANT
GOWN STRL REUS W/ TWL LRG LVL3 (GOWN DISPOSABLE) ×1 IMPLANT
GOWN STRL REUS W/TWL LRG LVL3 (GOWN DISPOSABLE) ×8 IMPLANT
GOWN STRL REUS W/TWL XL LVL3 (GOWN DISPOSABLE) ×3 IMPLANT
HANDPIECE INTERPULSE COAX TIP (DISPOSABLE) ×2
HOOD W/PEELAWAY (MISCELLANEOUS) ×12 IMPLANT
INST SET MAJOR BONE (KITS) ×3 IMPLANT
IV NS IRRIG 3000ML ARTHROMATIC (IV SOLUTION) ×3 IMPLANT
KIT BLADEGUARD II DBL (SET/KITS/TRAYS/PACK) ×3 IMPLANT
KIT ROOM TURNOVER APOR (KITS) ×3 IMPLANT
MANIFOLD NEPTUNE II (INSTRUMENTS) ×3 IMPLANT
MARKER SKIN DUAL TIP RULER LAB (MISCELLANEOUS) ×3 IMPLANT
NEEDLE HYPO 21X1.5 SAFETY (NEEDLE) ×3 IMPLANT
NEEDLE HYPO 25X1 1.5 SAFETY (NEEDLE) ×3 IMPLANT
NS IRRIG 1000ML POUR BTL (IV SOLUTION) ×3 IMPLANT
PACK TOTAL JOINT (CUSTOM PROCEDURE TRAY) ×3 IMPLANT
PAD ARMBOARD 7.5X6 YLW CONV (MISCELLANEOUS) ×3 IMPLANT
PAD DANNIFLEX CPM (ORTHOPEDIC SUPPLIES) ×3 IMPLANT
PIN TROCAR 3 INCH (PIN) ×3 IMPLANT
SAW OSC TIP CART 19.5X105X1.3 (SAW) ×3 IMPLANT
SET BASIN LINEN APH (SET/KITS/TRAYS/PACK) ×3 IMPLANT
SET HNDPC FAN SPRY TIP SCT (DISPOSABLE) ×1 IMPLANT
STAPLER VISISTAT 35W (STAPLE) ×3 IMPLANT
SUT BRALON NAB BRD #1 30IN (SUTURE) ×6 IMPLANT
SUT MON AB 0 CT1 (SUTURE) ×6 IMPLANT
SUT MON AB 2-0 CT1 36 (SUTURE) IMPLANT
SYR 20CC LL (SYRINGE) ×9 IMPLANT
SYR 30ML LL (SYRINGE) ×9 IMPLANT
SYR BULB IRRIGATION 50ML (SYRINGE) ×6 IMPLANT
TAPE CLOTH SURG 4X10 WHT LF (GAUZE/BANDAGES/DRESSINGS) ×3 IMPLANT
TOWEL OR 17X26 4PK STRL BLUE (TOWEL DISPOSABLE) ×3 IMPLANT
TOWER CARTRIDGE SMART MIX (DISPOSABLE) ×3 IMPLANT
TRAY FOLEY CATH SILVER 16FR (SET/KITS/TRAYS/PACK) ×3 IMPLANT
WATER STERILE IRR 1000ML POUR (IV SOLUTION) ×6 IMPLANT
YANKAUER SUCT 12FT TUBE ARGYLE (SUCTIONS) ×3 IMPLANT

## 2015-02-05 NOTE — Anesthesia Preprocedure Evaluation (Signed)
Anesthesia Evaluation  Patient identified by MRN, date of birth, ID band Patient awake    Reviewed: Allergy & Precautions, H&P , NPO status , [name] Chart, lab work & pertinent test results  History of Anesthesia Complications (+) PONV and history of anesthetic complications  Airway Mallampati: II  TM Distance: >3 FB     Dental  (+) Poor Dentition, Missing   Pulmonary asthma , COPDCurrent Smoker,  breath sounds clear to auscultation        Cardiovascular hypertension, Pt. on medications Rhythm:Regular Rate:Normal     Neuro/Psych  Neuromuscular disease    GI/Hepatic GERD-  Medicated and Controlled,(+) Cirrhosis -    substance abuse (remission 4yrs)  alcohol use, Hepatitis -, C  Endo/Other    Renal/GU      Musculoskeletal  (+) Fibromyalgia -  Abdominal   Peds  Hematology   Anesthesia Other Findings Infection L eyelid  Reproductive/Obstetrics                             Anesthesia Physical Anesthesia Plan  ASA: III  Anesthesia Plan: MAC and Spinal   Post-op Pain Management:    Induction: Intravenous  Airway Management Planned: Simple Face Mask  Additional Equipment:   Intra-op Plan:   Post-operative Plan:   Informed Consent: I have reviewed the patients History and Physical, chart, labs and discussed the procedure including the risks, benefits and alternatives for the proposed anesthesia with the patient or authorized representative who has indicated his/her understanding and acceptance.     Plan Discussed with:   Anesthesia Plan Comments:         Anesthesia Quick Evaluation

## 2015-02-05 NOTE — Interval H&P Note (Signed)
History and Physical Interval Note:  02/05/2015 7:19 AM  Colleen Lee  has presented today for surgery, with the diagnosis of OSTEOARTHRITIS LEFT KNEE  The various methods of treatment have been discussed with the patient and family. After consideration of risks, benefits and other options for treatment, the patient has consented to  Procedure(s): TOTAL KNEE ARTHROPLASTY (Left) as a surgical intervention .  The patient's history has been reviewed, patient examined, no change in status, stable for surgery.  I have reviewed the patient's chart and labs.  Questions were answered to the patient's satisfaction.     Arther Abbott

## 2015-02-05 NOTE — Op Note (Signed)
02/05/2015  11:03 AM  PATIENT:  Colleen Lee  59 y.o. female  PRE-OPERATIVE DIAGNOSIS:  OSTEOARTHRITIS LEFT KNEE  POST-OPERATIVE DIAGNOSIS:  OSTEOARTHRITIS LEFT KNEE  Operative findings proximal tibial deformity of the medial compartment with osteoarthritis of the medial compartment grade 3, slight flexion contracture.  Implants depuy size 3 femur, size 3 tibia, size 10 polyethylene PS insert. Size 38 patella.  Assisted by Corrie Dandy and by Barnetta Chapel page  Spinal anesthesia  PROCEDURE:  Procedure(s): LEFT TOTAL KNEE ARTHROPLASTY (Left)  SURGEON:  Surgeon(s) and Role:    * Carole Civil, MD - Primary  EBL:  Total I/O In: 1000 [I.V.:1000] Out: 350 [Urine:300; Blood:50]  BLOOD ADMINISTERED:none  DRAINS: One Hemovac drain  We used 30 mL of Marcaine with epinephrine We used 20 mL's of experel with 40 mL of saline  SPECIMEN:  No Specimen  DISPOSITION OF SPECIMEN:  N/A  COUNTS:  YES  TOURNIQUET:   Total Tourniquet Time Documented: Thigh (Left) - 82 minutes Total: Thigh (Left) - 82 minutes   DICTATION: .Viviann Spare Dictation  PLAN OF CARE: Admit to inpatient   PATIENT DISPOSITION:  PACU - hemodynamically stable.   Delay start of Pharmacological VTE agent (>24hrs) due to surgical blood loss or risk of bleeding: yes   Surgical dictation for left total knee    details of procedure:   The patient was identified in the preop holding area and the surgical site was confirmed as the left knee. Chart review and update were completed. The patient was taken to the operating room for spinal anesthesia. After successful spinal anesthesia Foley catheter was inserted. The patient was placed supine on the operating table.   the left leg was prepped with duraPrep and draped sterilely. Timeout was completed. The limb was then exsanguinated a  6 inch Esmarch. The tourniquet was elevated to 300 mmHg.   A midline incision was made and taken down to the extensor mechanism  followed by medial arthrotomy. The patella was everted. Side effect to me was performed as needed. The osteophytes were resected.  Anterior cruciate ligament and PCL and medial and lateral meniscus were resected.   a 3/8 inch drill bit was used to enter the femoral canal which was suctioned and irrigated until the fluid was clear. The distal femoral cut was set for 11 millimeter resection with a 5   Left Valgus angle. This cut was completed and checked for flatness.   the femur was then measured to a size 3.  The cutting block was placed to match the epicondyles and the 4 distal cuts were made.   the tibia was subluxated forward and the external alignment guide was placed. We removed 8 mm of bone from the higher lateral side. We set the guide for neutral varus valgus cut related to the  Mechanical axis of the tibia and for slope matching the patient's anatomy. Rotational alignment was set using the tibial tubercle, tibial spine and second metatarsal. The cutting block was pinned and the proximal tibia was resected.    spacer blocks were placed starting with a 10 mm insert to confirm equal flexion-extension gaps. A size  10  mm insert balanced the gaps.   We placed the femoral notch cutting guide size 3  and resected the notch.   Trial implants replaced using appropriate size femur , appropriate size tibial baseplate which was measured after the proximal tibia resection. Tibial rotation was set patella tracking was normal   The tibia was then punched per  manufacture technique making sure to avoid internal rotation.   The patella measured a size 25   We resected down to a size 16 using a size 38 x 9 button.   Final range of motion check was performed with the appropriate size trials as mentioned above. Satisfactory reduction and motion were obtained.   Trial implants were removed. The bone was irrigated and dried and the cement was mixed on the back table  These implants were then cemented in  place. Excess cement was removed. The cement was allowed to cure. Second irrigation was performed.    FInal range of motion check and stability check was completed  The wound was irrigated third time Hemovac drain was placed, extensor mechanism was closed with #1 Nurolon followed by 0 Monocryl and staples to reapproximate the skin edges and subcutaneous tissue.   Sterile dressing was applied  The patient was taken recovery in stable condition  Note the surgical case was delayed because the patient had very poor IV access. Despite trying to get a PICC line with an ultrasound and multiple IV attempts including internal jugular we had to call general surgery for consultation to obtain central line.

## 2015-02-05 NOTE — Anesthesia Postprocedure Evaluation (Signed)
  Anesthesia Post-op Note  Patient: Colleen Lee  Procedure(s) Performed: Procedure(s): LEFT TOTAL KNEE ARTHROPLASTY (Left)  Patient Location: PACU  Anesthesia Type:Spinal  Level of Consciousness: awake, alert  and oriented  Airway and Oxygen Therapy: Patient Spontanous Breathing  Post-op Pain: mild  Post-op Assessment: Post-op Vital signs reviewed, [name] Cardiovascular Status Stable, Respiratory Function Stable, Patent Airway and No signs of Nausea or vomiting LLE Motor Response: Purposeful movement LLE Sensation: Increased RLE Motor Response: Purposeful movement RLE Sensation: Increased L Sensory Level:  (diminishing) R Sensory Level:  (diminshing)  Post-op Vital Signs: Reviewed and stable  Last Vitals:  Filed Vitals:   02/05/15 1230  BP: 156/97  Pulse: 61  Temp: 36.4 C  Resp: 18    Complications: No apparent anesthesia complications

## 2015-02-05 NOTE — Progress Notes (Signed)
Triple lumen catheter insertion completed per Dr Arnoldo Morale. Tolerated well.

## 2015-02-05 NOTE — Progress Notes (Signed)
Triple lumen catheter insertion started per Dr Arnoldo Morale.

## 2015-02-05 NOTE — Interval H&P Note (Signed)
History and Physical Interval Note:  02/05/2015 7:18 AM  Colleen Lee  has presented today for surgery, with the diagnosis of OSTEOARTHRITIS LEFT KNEE  The various methods of treatment have been discussed with the patient and family. After consideration of risks, benefits and other options for treatment, the patient has consented to  Procedure(s): TOTAL KNEE ARTHROPLASTY (Left) as a surgical intervention .  The patient's history has been reviewed, patient examined, no change in status, stable for surgery.  I have reviewed the patient's chart and labs.  Questions were answered to the patient's satisfaction.     Arther Abbott

## 2015-02-05 NOTE — Progress Notes (Signed)
CXR done. CXR viewed per Dr Arnoldo Morale. No Pneumo. Ok to use triple lumen catheter.

## 2015-02-05 NOTE — Progress Notes (Signed)
Patient has a # 20 gauge IV in the left wrist, however the catheter is not all the way to the hub. MD request another IV for surgery. After multiple attempts from anesthesia using ultrasound and attempted EJ from Dr. Patsey Berthold no success. Dr. Arnoldo Morale in to obtain central line.

## 2015-02-05 NOTE — Brief Op Note (Signed)
02/05/2015  11:03 AM  PATIENT:  Colleen Lee  59 y.o. female  PRE-OPERATIVE DIAGNOSIS:  OSTEOARTHRITIS LEFT KNEE  POST-OPERATIVE DIAGNOSIS:  OSTEOARTHRITIS LEFT KNEE  Operative findings proximal tibial deformity of the medial compartment with osteoarthritis of the medial compartment grade 3, slight flexion contracture.  Implants depuy size 3 femur, size 3 tibia, size 10 polyethylene PS insert. Size 38 patella.  Assisted by Corrie Dandy and by Barnetta Chapel page  Spinal anesthesia  PROCEDURE:  Procedure(s): LEFT TOTAL KNEE ARTHROPLASTY (Left)  SURGEON:  Surgeon(s) and Role:    * Carole Civil, MD - Primary  EBL:  Total I/O In: 1000 [I.V.:1000] Out: 350 [Urine:300; Blood:50]  BLOOD ADMINISTERED:none  DRAINS: One Hemovac drain  We used 30 mL of Marcaine with epinephrine We used 20 mL's of experel with 40 mL of saline  SPECIMEN:  No Specimen  DISPOSITION OF SPECIMEN:  N/A  COUNTS:  YES  TOURNIQUET:   Total Tourniquet Time Documented: Thigh (Left) - 82 minutes Total: Thigh (Left) - 82 minutes   DICTATION: .Viviann Spare Dictation  PLAN OF CARE: Admit to inpatient   PATIENT DISPOSITION:  PACU - hemodynamically stable.   Delay start of Pharmacological VTE agent (>24hrs) due to surgical blood loss or risk of bleeding: yes   Surgical dictation for left total knee    details of procedure:   The patient was identified in the preop holding area and the surgical site was confirmed as the left knee. Chart review and update were completed. The patient was taken to the operating room for spinal anesthesia. After successful spinal anesthesia Foley catheter was inserted. The patient was placed supine on the operating table.   the left leg was prepped with duraPrep and draped sterilely. Timeout was completed. The limb was then exsanguinated a  6 inch Esmarch. The tourniquet was elevated to 300 mmHg.   A midline incision was made and taken down to the extensor mechanism  followed by medial arthrotomy. The patella was everted. Side effect to me was performed as needed. The osteophytes were resected.  Anterior cruciate ligament and PCL and medial and lateral meniscus were resected.   a 3/8 inch drill bit was used to enter the femoral canal which was suctioned and irrigated until the fluid was clear. The distal femoral cut was set for 11 millimeter resection with a 5   Left Valgus angle. This cut was completed and checked for flatness.   the femur was then measured to a size 3.  The cutting block was placed to match the epicondyles and the 4 distal cuts were made.   the tibia was subluxated forward and the external alignment guide was placed. We removed 8 mm of bone from the higher lateral side. We set the guide for neutral varus valgus cut related to the  Mechanical axis of the tibia and for slope matching the patient's anatomy. Rotational alignment was set using the tibial tubercle, tibial spine and second metatarsal. The cutting block was pinned and the proximal tibia was resected.    spacer blocks were placed starting with a 10 mm insert to confirm equal flexion-extension gaps. A size  10  mm insert balanced the gaps.   We placed the femoral notch cutting guide size 3  and resected the notch.   Trial implants replaced using appropriate size femur , appropriate size tibial baseplate which was measured after the proximal tibia resection. Tibial rotation was set patella tracking was normal   The tibia was then punched per  manufacture technique making sure to avoid internal rotation.   The patella measured a size 25   We resected down to a size 16 using a size 38 x 9 button.   Final range of motion check was performed with the appropriate size trials as mentioned above. Satisfactory reduction and motion were obtained.   Trial implants were removed. The bone was irrigated and dried and the cement was mixed on the back table  These implants were then cemented in  place. Excess cement was removed. The cement was allowed to cure. Second irrigation was performed.    FInal range of motion check and stability check was completed  The wound was irrigated third time Hemovac drain was placed, extensor mechanism was closed with #1 Nurolon followed by 0 Monocryl and staples to reapproximate the skin edges and subcutaneous tissue.   Sterile dressing was applied  The patient was taken recovery in stable condition  Note the surgical case was delayed because the patient had very poor IV access. Despite trying to get a PICC line with an ultrasound and multiple IV attempts including internal jugular we had to call general surgery for consultation to obtain central line.

## 2015-02-05 NOTE — Transfer of Care (Signed)
Immediate Anesthesia Transfer of Care Note  Patient: Colleen Lee  Procedure(s) Performed: Procedure(s): LEFT TOTAL KNEE ARTHROPLASTY (Left)  Patient Location: PACU  Anesthesia Type:Spinal  Level of Consciousness: awake, alert  and oriented  Airway & Oxygen Therapy: Patient Spontanous Breathing  Post-op Assessment: Report given to RN  Post vital signs: Reviewed and stable  Last Vitals:  Filed Vitals:   02/05/15 0815  BP:   Pulse:   Temp:   Resp: 18    Complications: No apparent anesthesia complications

## 2015-02-05 NOTE — Progress Notes (Signed)
1329 Patient c/o LEFT knee pain unrelieved with current pain medication order. MD notified.

## 2015-02-05 NOTE — Procedures (Signed)
Central Venous Catheter Insertion Procedure Note Colleen Lee [id number] [birth year]  Procedure: Insertion of Central Venous Catheter Indications: Assessment of intravascular volume, Drug and/or fluid administration and Frequent blood sampling  Procedure Details Consent: Risks of procedure as well as the alternatives and risks of each were explained to the (patient/caregiver).  Consent for procedure obtained. Time Out: Verified patient identification, verified procedure, site/side was marked, verified correct patient position, special equipment/implants available, medications/allergies/relevent history reviewed, required imaging and test results available.  Performed  Maximum sterile technique was used including antiseptics, cap, gloves, gown, hand hygiene, mask and sheet. Skin prep: Chlorhexidine; local anesthetic administered A antimicrobial bonded/coated triple lumen catheter was placed in the left subclavian vein using the Seldinger technique.  Evaluation Blood flow good Complications: No apparent complications Patient did tolerate procedure well. Chest X-ray ordered to verify placement.  CXR: normal.  Colleen Lee A 02/05/2015, 8:48 AM

## 2015-02-05 NOTE — Progress Notes (Signed)
Time out completed

## 2015-02-05 NOTE — Progress Notes (Signed)
Dr Arnoldo Morale at bedside to talk with pt re: triple lumen catheter. Pt in agreement. Consent signed.

## 2015-02-05 NOTE — Anesthesia Procedure Notes (Signed)
Spinal Patient location during procedure: OR Start time: 02/05/2015 9:07 AM Staffing Resident/CRNA: Tressie Stalker E Preanesthetic Checklist Completed: patient identified, site marked, surgical consent, pre-op evaluation, timeout performed, IV checked, risks and benefits discussed and monitors and equipment checked Spinal Block Patient position: left lateral decubitus Prep: Betadine Patient monitoring: heart rate, cardiac monitor, continuous pulse ox and blood pressure Approach: left paramedian Location: L3-4 Injection technique: single-shot Needle Needle type: Spinocan  Needle gauge: 22 G Needle length: 9 cm Assessment Sensory level: T8 Additional Notes  ATTEMPTS:1 TRAY XB:[id number] TRAY EXPIRATION DATE:01/2016

## 2015-02-06 ENCOUNTER — Encounter (HOSPITAL_COMMUNITY): Payer: Self-pay | Admitting: Orthopedic Surgery

## 2015-02-06 LAB — CBC
HCT: 32.9 % — ABNORMAL LOW (ref 36.0–46.0)
Hemoglobin: 11.2 g/dL — ABNORMAL LOW (ref 12.0–15.0)
MCH: 30.9 pg (ref 26.0–34.0)
MCHC: 34 g/dL (ref 30.0–36.0)
MCV: 90.9 fL (ref 78.0–100.0)
Platelets: 146 10*3/uL — ABNORMAL LOW (ref 150–400)
RBC: 3.62 MIL/uL — ABNORMAL LOW (ref 3.87–5.11)
RDW: 13.8 % (ref 11.5–15.5)
WBC: 9.6 10*3/uL (ref 4.0–10.5)

## 2015-02-06 LAB — BASIC METABOLIC PANEL
Anion gap: 6 (ref 5–15)
BUN: 6 mg/dL (ref 6–20)
CO2: 23 mmol/L (ref 22–32)
Calcium: 9.1 mg/dL (ref 8.9–10.3)
Chloride: 104 mmol/L (ref 101–111)
Creatinine, Ser: 0.53 mg/dL (ref 0.44–1.00)
GFR calc Af Amer: >60 mL/min (ref >60)
GFR calc non Af Amer: >60 mL/min (ref >60)
Glucose, Bld: 110 mg/dL — ABNORMAL HIGH (ref 65–99)
Potassium: 3.6 mmol/L (ref 3.5–5.1)
Sodium: 133 mmol/L — ABNORMAL LOW (ref 135–145)

## 2015-02-06 MED ORDER — PREGABALIN 50 MG PO CAPS
50.0000 mg | ORAL_CAPSULE | Freq: Three times a day (TID) | ORAL | Status: DC
  Administered 2015-02-06 – 2015-02-08 (×7): 50 mg via ORAL
  Filled 2015-02-06: qty 2
  Filled 2015-02-06 (×6): qty 1

## 2015-02-06 NOTE — Anesthesia Postprocedure Evaluation (Signed)
  Anesthesia Post-op Note  Patient: Colleen Lee  Procedure(s) Performed: Procedure(s): LEFT TOTAL KNEE ARTHROPLASTY (Left)  Patient Location: Nursing Unit  Anesthesia Type:Spinal  Level of Consciousness: awake, alert  and oriented  Airway and Oxygen Therapy: Patient Spontanous Breathing  Post-op Pain: mild  Post-op Assessment: Post-op Vital signs reviewed, [name] Cardiovascular Status Stable, Respiratory Function Stable, Patent Airway, No signs of Nausea or vomiting and Adequate PO intake LLE Motor Response: Purposeful movement LLE Sensation: Increased RLE Motor Response: Purposeful movement RLE Sensation: Increased L Sensory Level:  (diminishing) R Sensory Level:  (diminshing)  Post-op Vital Signs: Reviewed and stable  Last Vitals:  Filed Vitals:   02/06/15 0400  BP: 171/101  Pulse: 93  Temp: 37.1 C  Resp: 18    Complications: No apparent anesthesia complications

## 2015-02-06 NOTE — Care Management Note (Signed)
Case Management Note  Patient Details  Name: Colleen Lee MRN: [id number] Date of Birth: Jul 23, 1956  Subjective/Objective:                  Pt admitted from home s/p total knee. Pt lives with her husband and will return home at discharge. Pt had been fairly independent with ADL's prior to surgery.  Action/Plan: Pt would like outpt PT at discharge. Will arrange with [facility] outpt PT dept. Pt would like BSC and RW from [place]. CPM from [facility]. Referral called to [facility] for CPM. CPm will be delivered to pts home at discharge.Will continue to follow for discharge planning needs.  Expected Discharge Date:                  Expected Discharge Plan:  Home/Self Care  In-House Referral:  NA  Discharge planning Services  CM Consult  Post Acute Care Choice:  Durable Medical Equipment Choice offered to:  Patient  DME Arranged:  Bedside commode, CPM, Walker rolling DME Agency:  [place]  HH Arranged:    [facility] Agency:     Status of Service:  Completed, signed off  Medicare Important Message Given:    Date Medicare IM Given:    Medicare IM give by:    Date Additional Medicare IM Given:    Additional Medicare Important Message give by:     If discussed at [place] of Stay Meetings, dates discussed:    Additional Comments:  Joylene Draft, RN 02/06/2015, 2:14 PM

## 2015-02-06 NOTE — Clinical Social Work Note (Signed)
Clinical Social Work Assessment  Patient Details  Name: Colleen Lee MRN: [id number] Date of Birth: [birth year]  Date of referral:  02/06/15               Reason for consult:  Facility Placement                Permission sought to share information with:    Permission granted to share information::     Name::        Agency::     Relationship::     Contact Information:     Housing/Transportation Living arrangements for the past 2 months:  Single Family Home Source of Information:  Patient Patient Interpreter Needed:  None Criminal Activity/Legal Involvement Pertinent to Current Situation/Hospitalization:  No - Comment as needed Significant Relationships:  Spouse Lives with:  Spouse Do you feel safe going back to the place where you live?  Yes Need for family participation in patient care:  Yes (Comment)  Care giving concerns:  Pt does not have anyone to assist her much at home. She helps take care of her husband.    Social Worker assessment / plan:  CSW met with pt at bedside. Pt alert and oriented and reports she lives with her husband who is her best support. Pt's husband has cancer and pt indicates that she has put off her knee surgery for 4 years due to this. She is very concerned about her husband's needs and wants to d/c home as soon as possible. CSW discussed current PT recommendations for SNF, but pt states she has ambulated in the hall and will be fine at home. She admits to a lot of pain right now. Pt requesting outpatient PT which she has had before. She also had questions about someone who could come in and help with house work. CSW discussed that this sounded like [facility] which has several months wait list. Pt insists that it is something else. CSW shared that she could pay privately for someone in the home, but she wants to discuss with CM. CM notified.   Employment status:  Disabled (Comment on whether or not currently receiving Disability) Insurance information:   [place]icaid [place], [employer] PT Recommendations:  [place] / Referral to community resources:  [place]  Patient/Family's Response to care:  Pt adamant that she must return home at d/c to help her husband. His sister is currently assisting him, but was concerned about how long she would need to be available.   Patient/Family's Understanding of and Emotional Response to Diagnosis, Current Treatment, and Prognosis:  Pt aware of recommendations for SNF following total knee. However, she refuses to consider this even for a short period of time. CSW will sign off, but can be reconsulted if needed.   Emotional Assessment Appearance:  Appears stated age Attitude/Demeanor/Rapport:  Other (Cooperative) Affect (typically observed):  Appropriate Orientation:  Oriented to Self, Oriented to Place, Oriented to  Time, Oriented to Situation Alcohol / Substance use:  Not Applicable Psych involvement (Current and /or in the community):  No (Comment)  Discharge Needs  Concerns to be addressed:  Discharge Planning Concerns Readmission within the last 30 days:  No Current discharge risk:  Physical Impairment Barriers to Discharge:  Continued Medical Work up   Salome Arnt, Lee 02/06/2015, 11:52 AM [phone]

## 2015-02-06 NOTE — Evaluation (Signed)
Physical Therapy Evaluation Patient Details Name: Colleen Lee MRN: [id number] DOB: 10-13-1955 Today's Date: 02/05/15   History of Present Illness   Pt is a 59yo black female who reports chronic pain and declining function in L knee for the past year. Pt elected to undergo L TKA on 7/6 and presents today a few hours after procedure.   Clinical Impression  Pt is received semirecumbent in bed upon entry, awake, alert, and reporting significant pain, but willing to participate.  Pt is A&Ox3 and pleasant considering circumstances. Pt reports zero falls in the last 6 months. Pt grip strength is strong and equal bilat. Pt Strength on LLE is very weak, largely from pain inhibition. Pt remaining on 2L O2 throughout evaluation at 100%, but taken at end c O@ doffed registered at 93%. Patient presenting with impairment of strength, range of motion, balance, oxygen perfusion, and activity tolerance, limiting ability to perform ADL and mobility tasks at  baseline level of function. Patient will benefit from skilled intervention to address the above impairments and limitations, in order to restore to prior level of function, improve patient safety upon discharge, and to decrease falls risk.       Follow Up Recommendations SNF    Equipment Recommendations  Rolling walker with 5" wheels    Recommendations for Other Services       Precautions / Restrictions Precautions Precautions: None Restrictions Weight Bearing Restrictions: Yes LLE Weight Bearing: Weight bearing as tolerated      Mobility  Bed Mobility               General bed mobility comments: Pt not tolerating bed mobility at this time due to overwhelming pain response; will assess later.   Transfers                    Ambulation/Gait                Stairs            Wheelchair Mobility    Modified Rankin (Stroke Patients Only)       Balance                                            Pertinent Vitals/Pain Pain Assessment: 0-10 Pain Score: 10-Worst pain ever Pain Location: L knee Pain Descriptors / Indicators: Aching Pain Intervention(s): Limited activity within patient's tolerance;Monitored during session;Ice applied    Home Living Family/patient expects to be discharged to:: Private residence Living Arrangements: Spouse/significant other Available Help at Discharge: Family Type of Home: Mobile home Home Access: Stairs to enter Entrance Stairs-Rails: Chemical engineer of Steps: 4 Home Layout: One level Home Equipment: None Additional Comments: Mobility has been limited and declining for about 1 year.     Prior Function Level of Independence: Needs assistance   Gait / Transfers Assistance Needed: Pt only performing [place] mobility in most recent months.   ADL's / Homemaking Assistance Needed: Able to perform with limited time tolerated in standing.         Hand Dominance        Extremity/Trunk Assessment   Upper Extremity Assessment: Overall WFL for tasks assessed           Lower Extremity Assessment: Generalized weakness;LLE deficits/detail   LLE Deficits / Details: Limited ability to activate all muscles, likely related to pain inhibition.  Communication      Cognition Arousal/Alertness: Awake/alert Behavior During Therapy: Anxious;Agitated;WFL for tasks assessed/performed (Poor pain control, but willing to work c PT. ) Overall Cognitive Status: Within Functional Limits for tasks assessed                      General Comments      Exercises Total Joint Exercises Ankle Circles/Pumps: 15 reps;Both;AAROM;Supine Quad Sets: AROM;Left;10 reps;Supine (difficulty c activation, assigned as HEP. ) Heel Slides: AAROM;Left;Supine;10 reps (Pt not tolerating very well, gradaully improving pain response and ROM but untilmately very painful. ) Hip ABduction/ADduction: AROM;Left;10 reps;Supine Goniometric ROM: 6-35  degrees L knee flexion      Assessment/Plan    PT Assessment    PT Diagnosis Difficulty walking;Generalized weakness;Acute pain   PT Problem List    PT Treatment Interventions     PT Goals (Current goals can be found in the Care Plan section) Acute Rehab PT Goals Patient Stated Goal: Pt is highly focused on improving pain control, and plans on DC to home c HHPT.  PT Goal Formulation: With patient Time For Goal Achievement: 02/20/15 Potential to Achieve Goals: Fair    Frequency     Barriers to discharge        Co-evaluation               End of Session   Activity Tolerance: Patient limited by pain Patient left: in bed;with bed alarm set;with call bell/phone within reach;with SCD's reapplied Nurse Communication: Other (comment) (Need for more ice. )         Time:  -    2751-7001 (19 minutes)    Charges:  1 Unit PT Evaluation                   1 Unit Therapeutic Exercise       PT G Codes:        Buccola,Allan C 2015/03/05, 8:58 AM  9:12 AM  Etta Grandchild, PT, DPT [place] License # [id number]

## 2015-02-06 NOTE — Progress Notes (Signed)
0808 Patient OOB up to bedside commode w/o difficulty. Foley catheter removed this AM. Patient voiding w/o difficulty.

## 2015-02-06 NOTE — Progress Notes (Signed)
Postoperative note  Postoperative day number  POD 1  Status post LEFT TKA C/O THIGH SORENESS  Vital signs  BP 171/101 mmHg  Pulse 93  Temp(Src) 98.7 F (37.1 C) (Oral)  Resp 18  Ht 5\' 6"  (1.676 m)  Wt 163 lb (73.936 kg)  BMI 26.32 kg/m2  SpO2 100%   Pertinent labs   CBC Latest Ref Rng 01/31/2015 11/19/2014 08/14/2014  WBC 4.0 - 10.5 K/uL 6.4 8.4 6.8  Hemoglobin 12.0 - 15.0 g/dL 13.1 16.8(H) 16.8(H)  Hematocrit 36.0 - 46.0 % 38.2 47.3(H) 48.7(H)  Platelets 150 - 400 K/uL 177 202 129(L)     Patient complaints  THIGH SORENESS, NOT KNEE PAIN   Physical exam  NEURO VASCULAR AND MOTOR EXAMS ARE NORMASL THIGHT TENDER FROM [facility]  Assessment and plan   TORADOL FOR THIGH SORENESS  ADD [facility] START REHAB

## 2015-02-06 NOTE — Progress Notes (Signed)
Physical Therapy Treatment Patient Details Name: Colleen Lee MRN: [id number] DOB: [birth year] Today's Date: 02/06/2015    History of Present Illness Pt is 59 yo black female who reports chronic history of worsening L knee pain and functional decline. Pt elected to undergo TKA on 7/6.     PT Comments    Pt tolerating treatment session well, motivated and able to complete entire PT sesssion as planned. Pt continues to make progress toward goals as evidenced by improved tolerance to ROM of L knee. Pt's greatest limitation continues to be quads weakness on L which continues to limit ability to perform all mobility and HEP safely and c independence. Patient presenting with impairment of strength, pain, range of motion, balance, and activity tolerance, limiting ability to perform ADL and mobility tasks at  baseline level of function. Patient will benefit from skilled intervention to address the above impairments and limitations, in order to restore to prior level of function, improve patient safety upon discharge, and to decrease caregiver burden.    Follow Up Recommendations  SNF     Equipment Recommendations  Rolling walker with 5" wheels    Recommendations for Other Services       Precautions / Restrictions Precautions Precautions: None Restrictions Weight Bearing Restrictions: Yes LLE Weight Bearing: Weight bearing as tolerated    Mobility  Bed Mobility Overal bed mobility: Modified Independent Bed Mobility: Sit to Supine       Sit to supine: Modified independent (Device/Increase time)   General bed mobility comments: Cues for hooking LLE for self assist.   Transfers Overall transfer level: Needs assistance Equipment used: Rolling walker (2 wheeled) Transfers: Sit to/from Stand Sit to Stand: Supervision         General transfer comment: heavy verbal cues for safe use of AD.   Ambulation/Gait Ambulation/Gait assistance: Min guard Ambulation Distance (Feet): 80  Feet Assistive device: Rolling walker (2 wheeled) Gait Pattern/deviations: Antalgic Gait velocity: 0.5 ft/sec Gait velocity interpretation: <1.8 ft/sec, indicative of risk for recurrent falls General Gait Details: very small steps, limited ROM in L knee during active gait cycle.    Stairs Stairs:  (Needs to perform stairs prior to DC;)          Wheelchair Mobility    Modified Rankin (Stroke Patients Only)       Balance Overall balance assessment: Modified Independent;No apparent balance deficits (not formally assessed)                                  Cognition Arousal/Alertness: Lethargic Behavior During Therapy: WFL for tasks assessed/performed Overall Cognitive Status: Within Functional Limits for tasks assessed                      Exercises Total Joint Exercises Ankle Circles/Pumps: 15 reps;Both;AAROM;Supine Long Arc Quad: AAROM;Strengthening;Left;Seated;10 reps Knee Flexion: AAROM;Strengthening;Seated;Left;10 reps    General Comments        Pertinent Vitals/Pain Pain Assessment: 0-10 Pain Score: 6  (Pt reporting no pain upon entry, about 6/10 p session. ) Pain Location: L knee.  Pain Descriptors / Indicators: Aching Pain Intervention(s): Limited activity within patient's tolerance;Monitored during session;Premedicated before session    Home Living                      Prior Function            PT Goals (current goals can now be  found in the care plan section) Acute Rehab PT Goals Patient Stated Goal: To decrease pain PT Goal Formulation: With patient Time For Goal Achievement: 02/20/15 Potential to Achieve Goals: Fair Progress towards PT goals: Progressing toward goals    Frequency  BID    PT Plan Current plan remains appropriate    Co-evaluation             End of Session Equipment Utilized During Treatment: Gait belt Activity Tolerance: Patient tolerated treatment well Patient left: with call  bell/phone within reach;in bed;in CPM;with bed alarm set     Time: 7893-8101 PT Time Calculation (min) (ACUTE ONLY): 37 min  Charges:  $Gait Training: 8-22 mins $Therapeutic Exercise: 8-22 mins                    G Codes:      Buccola,Allan C 2015-02-23, 5:28 PM 5:30 PM  Etta Grandchild, PT, DPT [place] License # [id number]

## 2015-02-06 NOTE — Progress Notes (Signed)
Physical Therapy Treatment Patient Details Name: Colleen Lee MRN: [id number] DOB: 05-Jul-1956 Today's Date: 02/06/2015    History of Present Illness Pt is 59 yo black female who reports chronic history of worsening L knee pain and functional decline. Pt elected to undergo TKA on 7/6.     PT Comments    Pt tolerating treatment session well, motivated and able to complete entire PT sesssion as planned. Pt continues to make progress toward goals as evidenced by improved pain control c mobility and HEP. Pt's greatest limitation continues to be pain c ROM and Quads weakness which continues to limit ability to perform independent mobility safely and at baseline function. Patient presenting with impairment of strength, pain, range of motion, balance, and activity tolerance, limiting ability to perform ADL and mobility tasks at  baseline level of function. Patient will benefit from skilled intervention to address the above impairments and limitations, in order to restore to prior level of function, improve patient safety upon discharge, and to decrease caregiver burden.    Follow Up Recommendations  SNF     Equipment Recommendations  Rolling walker with 5" wheels    Recommendations for Other Services       Precautions / Restrictions Precautions Precautions: None Restrictions Weight Bearing Restrictions: Yes LLE Weight Bearing: Weight bearing as tolerated    Mobility  Bed Mobility Overal bed mobility: Needs Assistance Bed Mobility: Supine to Sit     Supine to sit: Supervision     General bed mobility comments: Cues for scooting hips toward edge of bed.   Transfers Overall transfer level: Needs assistance Equipment used: Rolling walker (2 wheeled) Transfers: Sit to/from Stand Sit to Stand: Min guard         General transfer comment: Unable to assess  Ambulation/Gait Ambulation/Gait assistance: Min guard Ambulation Distance (Feet): 60 Feet Assistive device: Rolling  walker (2 wheeled) Gait Pattern/deviations: Antalgic Gait velocity: Very slow   General Gait Details: very small steps, limited ROM od knee during active gait cycle.    Stairs Stairs:  (Will need to perform 4 steps prior to DC for entry of home. )          Wheelchair Mobility    Modified Rankin (Stroke Patients Only)       Balance Overall balance assessment: Modified Independent;No apparent balance deficits (not formally assessed)                                  Cognition Arousal/Alertness: Awake/alert Behavior During Therapy: WFL for tasks assessed/performed Overall Cognitive Status: Within Functional Limits for tasks assessed                      Exercises Total Joint Exercises Ankle Circles/Pumps: 15 reps;Both;AAROM;Supine Quad Sets: AROM;Left;10 reps;Supine;Strengthening Short Arc Quad: AAROM;Strengthening;Left;10 reps;Supine (Requires mod-max assist to complete. Improving c repitition) Heel Slides: AAROM;Left;Supine;20 reps (very slow to improve ROM. ) Hip ABduction/ADduction: AROM;Left;10 reps;Supine Straight Leg Raises:  (Not ready yet, due to quads weakness c other exercises. ) Goniometric ROM: 8-68 degrees L knee flexion    General Comments        Pertinent Vitals/Pain Pain Assessment: 0-10 Pain Score: 8  (6 before session, 8/10 after) Pain Location: L knee, ant & post Pain Descriptors / Indicators: Aching Pain Intervention(s): Limited activity within patient's tolerance;Monitored during session;Premedicated before session    Home Living Family/patient expects to be discharged to:: Private residence Living Arrangements:  Spouse/significant other Available Help at Discharge: Family Type of Home: Mobile home Home Access: Stairs to enter Entrance Stairs-Rails: Left;Right Home Layout: One level Home Equipment: None Additional Comments: Mobility has been limited and declining for about 1 year.     Prior Function Level of  Independence: Needs assistance  Gait / Transfers Assistance Needed: Pt only performing HH mobility in most recent months.  ADL's / Homemaking Assistance Needed: Pt reports she requires assistance with bathing, dressing, and housekeeping tasks. It is unclear if husband is able to assist or not.      PT Goals (current goals can now be found in the care plan section) Acute Rehab PT Goals Patient Stated Goal: To decrease pain PT Goal Formulation: With patient Time For Goal Achievement: 02/20/15 Potential to Achieve Goals: Fair Progress towards PT goals: Progressing toward goals    Frequency  BID    PT Plan Current plan remains appropriate    Co-evaluation             End of Session Equipment Utilized During Treatment: Gait belt Activity Tolerance: Patient limited by pain;Patient tolerated treatment well Patient left: in chair;with call bell/phone within reach;with chair alarm set (Will don CPM after BID treatment)     Time: 4098-1191 PT Time Calculation (min) (ACUTE ONLY): 39 min  Charges:  $Gait Training: 8-22 mins $Therapeutic Exercise: 23-37 mins                    G Codes:      Ulysee Fyock C 02-17-2015, 11:48 AM  11:49 AM  Rosamaria Lints, PT, DPT [place] License # [id number]

## 2015-02-06 NOTE — Addendum Note (Signed)
Addendum  created 02/06/15 0825 by Ollen Bowl, CRNA   Modules edited: Anesthesia Medication Administration, Notes Section   Notes Section:  File: [id number]

## 2015-02-06 NOTE — Evaluation (Signed)
Occupational Therapy Evaluation Patient Details Name: NGOZI ALVIDREZ MRN: [id number] DOB: 04/18/1956 Today's Date: 02/06/2015    History of Present Illness Pt is 59 yo black female who reports chronic history of worsening L knee pain and functional decline. Pt elected to undergo TKA on 7/6.    Clinical Impression   PTA pt lived at home with husband who provided assistance during BADL tasks including dressing and bathing. Pt reports that husband is a cancer survivor and will be unable to provide assistance when she returns home on discharge. It is unclear from pt description the amount of assistance she received prior to surgery and the support she will have on discharge. Pt unwilling to perform bed mobility due to pain this am. Recommend continued acute OT services to further determine level of functioning and safety during B/IADL tasks and functional mobility.     Follow Up Recommendations   (defer to PT evaluation)    Equipment Recommendations  Tub/shower bench       Precautions / Restrictions Precautions Precautions: None Restrictions Weight Bearing Restrictions: Yes LLE Weight Bearing: Weight bearing as tolerated      Mobility Bed Mobility               General bed mobility comments: Pt unwilling to perform bed mobility tasks due to pain.   Transfers                 General transfer comment: Unable to assess         ADL Overall ADL's : Needs assistance/impaired                                       General ADL Comments: Pt reports she requires assistance with bathing and dressing tasks and some housekeeping tasks. Pt reports she has to sit down for majority of activities and fatigues easily     Vision Vision Assessment?: No apparent visual deficits          Pertinent Vitals/Pain Pain Assessment: 0-10 Pain Score: 7  Pain Location: L knee Pain Descriptors / Indicators: Aching Pain Intervention(s): Limited activity within  patient's tolerance;Monitored during session     Hand Dominance Right   Extremity/Trunk Assessment Upper Extremity Assessment Upper Extremity Assessment: Overall WFL for tasks assessed   Lower Extremity Assessment Lower Extremity Assessment: Defer to PT evaluation LLE Deficits / Details: Limited ability to activate all muscles, likely related to pain inhibition.  LLE: Unable to fully assess due to pain       Communication Communication Communication: No difficulties   Cognition Arousal/Alertness: Awake/alert Behavior During Therapy: WFL for tasks assessed/performed Overall Cognitive Status: Within Functional Limits for tasks assessed                                Home Living Family/patient expects to be discharged to:: Private residence Living Arrangements: Spouse/significant other Available Help at Discharge: Family Type of Home: Mobile home Home Access: Stairs to enter Technical brewer of Steps: 4 Entrance Stairs-Rails: Left;Right Home Layout: One level     Bathroom Shower/Tub: Teacher, early years/pre: [place]: None   Additional Comments: Mobility has been limited and declining for about 1 year.       Prior Functioning/Environment Level of Independence: Needs assistance  Gait / Transfers Assistance Needed: Pt only  performing [place] mobility in most recent months.  ADL's / Homemaking Assistance Needed: Pt reports she requires assistance with bathing, dressing, and housekeeping tasks. It is unclear if husband is able to assist or not.         OT Diagnosis: Acute pain;Other (comment) (limited functional BADL completion)   OT Problem List: Decreased activity tolerance;Pain;Decreased knowledge of use of DME or AE   OT Treatment/Interventions: Self-care/ADL training;DME and/or AE instruction;Patient/family education    OT Goals(Current goals can be found in the care plan section) Acute Rehab OT Goals Patient Stated  Goal: To decrease pain OT Goal Formulation: With patient Time For Goal Achievement: 02/20/15 Potential to Achieve Goals: Good  OT Frequency: Min 2X/week   Barriers to D/C: Decreased caregiver support;Other (comment) (Caregiver support and available assistance unclear)             End of Session    Activity Tolerance: Patient limited by pain Patient left: in bed;with call bell/phone within reach   Time: 9675-9163 OT Time Calculation (min): 12 min Charges:  OT General Charges $OT Visit: 1 Procedure OT Evaluation $Initial OT Evaluation Tier I: 1 Procedure  Guadelupe Sabin, OTR/L  [phone]  02/06/2015, 9:50 AM

## 2015-02-07 LAB — TYPE AND SCREEN
ABO/RH(D): O POS
Antibody Screen: NEGATIVE
Unit division: 0
Unit division: 0

## 2015-02-07 LAB — CBC
HCT: 27.1 % — ABNORMAL LOW (ref 36.0–46.0)
Hemoglobin: 9.4 g/dL — ABNORMAL LOW (ref 12.0–15.0)
MCH: 31.2 pg (ref 26.0–34.0)
MCHC: 34.7 g/dL (ref 30.0–36.0)
MCV: 90 fL (ref 78.0–100.0)
Platelets: 141 10*3/uL — ABNORMAL LOW (ref 150–400)
RBC: 3.01 MIL/uL — ABNORMAL LOW (ref 3.87–5.11)
RDW: 13.9 % (ref 11.5–15.5)
WBC: 11.5 10*3/uL — ABNORMAL HIGH (ref 4.0–10.5)

## 2015-02-07 NOTE — Care Management (Signed)
Important Message  Patient Details  Name: Colleen Lee MRN: [id number] Date of Birth: 01-15-1956   Medicare Important Message Given:  Yes-second notification given    Joylene Draft, RN 02/07/2015, 12:55 PM

## 2015-02-07 NOTE — Care Management Note (Signed)
Case Management Note  Patient Details  Name: Colleen Lee MRN: [id number] Date of Birth: April 29, 1956  Subjective/Objective:                    Action/Plan:   Expected Discharge Date:                  Expected Discharge Plan:  Home/Self Care  In-House Referral:  NA  Discharge planning Services  CM Consult  Post Acute Care Choice:  Durable Medical Equipment Choice offered to:  Patient  DME Arranged:  Bedside commode, CPM, Walker rolling DME Agency:  [place]:    [place]:     Status of Service:  Completed, signed off  Medicare Important Message Given:  Yes-second notification given Date Medicare IM Given:    Medicare IM give by:    Date Additional Medicare IM Given:    Additional Medicare Important Message give by:     If discussed at [place] of Stay Meetings, dates discussed:    Additional Comments: Anticipate discharge within 24 hours. Pt has follow up appt with [facility] out[facility] PT dept for Monday, July 11 for first PT therapy. Order faxed to [facility] out[facility] PT dept for PT. Pt has rolling walker in room and BSC and CPM will be delivered to pt from [facility] at discharge. Weekend staff will need to call and fax orders to [place] for the CPM once orders written by ED. Pt and pts nurse aware of discharge arrangements. Christinia Gully [place], RN 02/07/2015, 4:31 PM

## 2015-02-07 NOTE — Progress Notes (Signed)
Physical Therapy Treatment Patient Details Name: Colleen Lee MRN: [id number] DOB: March 12, 1956 Today's Date: 11-Feb-2015    History of Present Illness Pt is 59 yo black female who reports chronic history of worsening L knee pain and functional decline. Pt elected to undergo TKA on 7/6.     PT Comments    Pt is progressing well and is not in need of SNF at this time.  Follow Up Recommendations  Home health PT;Outpatient PT (pt does not need SNF at this time.)     Equipment Recommendations  Rolling walker with 5" wheels       Precautions / Restrictions Precautions Precautions: None Restrictions Weight Bearing Restrictions: Yes LLE Weight Bearing: Weight bearing as tolerated    Mobility  Bed Mobility Overal bed mobility: Modified Independent       Supine to sit: Modified independent (Device/Increase time)        Transfers Overall transfer level: Modified independent Equipment used: Rolling walker (2 wheeled) Transfers: Sit to/from Stand Sit to Stand: Modified independent (Device/Increase time)            Ambulation/Gait Ambulation/Gait assistance: Modified independent (Device/Increase time) Ambulation Distance (Feet): 80 Feet Assistive device: Rolling walker (2 wheeled) Gait Pattern/deviations: Step-to pattern   Gait velocity interpretation: Below normal speed for age/gender            Cognition Arousal/Alertness: Awake/alert Behavior During Therapy: WFL for tasks assessed/performed Overall Cognitive Status: Within Functional Limits for tasks assessed                      Exercises Total Joint Exercises Ankle Circles/Pumps: Both;10 reps Quad Sets: Both;10 reps Short Arc Quad: Left;10 reps Heel Slides: Left;10 reps Hip ABduction/ADduction: Left;10 reps Straight Leg Raises: Left;10 reps Long Arc Quad: Left;10 reps Goniometric ROM: 7-70        Pertinent Vitals/Pain Pain Assessment: 0-10 Pain Score: 6  Pain Location: Lt knee Pain  Descriptors / Indicators: Aching Pain Intervention(s): Limited activity within patient's tolerance       Prior Function     I        PT Goals (current goals can now be found in the care plan section) Acute Rehab PT Goals Patient Stated Goal: To decrease pain Progress towards PT goals: Progressing toward goals    Frequency  7X/week    PT Plan Discharge plan needs to be updated       End of Session Equipment Utilized During Treatment: Gait belt Activity Tolerance: Patient tolerated treatment well Patient left: in chair;with call bell/phone within reach;with chair alarm set     Time: 3546-5681 PT Time Calculation (min) (ACUTE ONLY): 31 min  Charges:  $Gait Training: 8-22 mins $Therapeutic Exercise: 8-22 mins                    G Codes:      Akina Maish,[name] February 11, 2015, 10:00 AM

## 2015-02-07 NOTE — Progress Notes (Signed)
Occupational Therapy Treatment Patient Details Name: Colleen Lee MRN: [id number] DOB: 03-09-1956 Today's Date: 02/07/2015    History of present illness Pt is 59 yo black female who reports chronic history of worsening L knee pain and functional decline. Pt elected to undergo TKA on 7/6.    OT comments  Pt awake, alert, and sitting up in bed this am. Pt reports 4/10 pain, had pain medication around 5am. Pt willing to get OOB, requested to perform toileting and grooming tasks. Pt required min guard during functional mobility to restroom and sink, supervision during standing grooming tasks. Pt states she is "ready to walk with the physical therapy today." Pt progressing towards goals. Pt reports she is going to have outpatient PT services on discharge. Discharge with no OT follow-up is appropriate at this time.    Follow Up Recommendations  Supervision/Assistance - 24 hour    Equipment Recommendations  None recommended by OT       Precautions / Restrictions Precautions Precautions: None Restrictions Weight Bearing Restrictions: Yes LLE Weight Bearing: Weight bearing as tolerated       Mobility Bed Mobility Overal bed mobility: Modified Independent       Supine to sit: Modified independent (Device/Increase time)        Transfers Overall transfer level: Needs assistance Equipment used: Rolling walker (2 wheeled) Transfers: Sit to/from Stand Sit to Stand: Supervision                  ADL Overall ADL's : Needs assistance/impaired Eating/Feeding: Modified independent   Grooming: Wash/dry hands;Wash/dry face;Brushing hair;Supervision/safety;Standing                   Toilet Transfer: Min guard;Ambulation;BSC;RW   Toileting- Clothing Manipulation and Hygiene: Modified independent;Sitting/lateral lean       Functional mobility during ADLs: Min guard;Rolling walker                  Cognition   Behavior During Therapy: WFL for tasks  assessed/performed Overall Cognitive Status: Within Functional Limits for tasks assessed                                    Pertinent Vitals/ Pain       Pain Assessment: 0-10 Pain Score: 4  Pain Location: L Knee Pain Descriptors / Indicators: Aching Pain Intervention(s): Limited activity within patient's tolerance;Monitored during session;Ice applied         Frequency Min 2X/week     Progress Toward Goals  OT Goals(current goals can now be found in the care plan section)  Progress towards OT goals: Progressing toward goals  Acute Rehab OT Goals Patient Stated Goal: To decrease pain ADL Goals Pt Will Perform Lower Body Dressing: with modified independence;with adaptive equipment;sitting/lateral leans Pt Will Transfer to Toilet: with supervision;ambulating;regular height toilet Additional ADL Goal #1: Pt will perform functional transfers and mobility tasks with min guard to increase independence and safety in the home.   Plan Discharge plan remains appropriate       End of Session Equipment Utilized During Treatment: Gait belt;Rolling walker   Activity Tolerance Patient tolerated treatment well   Patient Left in chair;with call bell/phone within reach;with chair alarm set           Time: 0350-0938 OT Time Calculation (min): 46 min  Charges: OT General Charges $OT Visit: 1 Procedure OT Treatments $Self Care/Home Management : 38-52 mins  Magda Paganini  [place], OTR/L  [phone]  02/07/2015, 9:02 AM

## 2015-02-07 NOTE — Progress Notes (Signed)
Physical Therapy Treatment Patient Details Name: Colleen Lee MRN: [id number] DOB: 1956-06-27 Today's Date: 02/07/2015    History of Present Illness Pt is 59 yo black female who reports chronic history of worsening L knee pain and functional decline. Pt elected to undergo TKA on 7/6.     PT Comments    Pt continues to progress well anticipate D/C tomorrow.  Follow Up Recommendations   OP     Equipment Recommendations    RW; CPM   Recommendations for Other Services none    Precautions / Restrictions none         Cognition Arousal/Alertness: Awake/alert Behavior During Therapy: WFL for tasks assessed/performed Overall Cognitive Status: Within Functional Limits for tasks assessed                      Exercises Total Joint Exercises Ankle Circles/Pumps: Both;10 reps Quad Sets: Both;10 reps Heel Slides: Left;10 reps Hip ABduction/ADduction: Left;10 reps Straight Leg Raises: Left;5 reps Goniometric ROM: 7-70 AA    Pt ambulated with RW with SBA for 90 feet. Pt placed in CPM after exercises at 0-70.   Call light given to pt.     Pertinent Vitals/Pain Pain Score: 5  Pain Location: Lt knee Pain Descriptors / Indicators: Aching           PT Goals (current goals can now be found in the care plan section)      Frequency   daily    PT Plan Current plan remains appropriate       End of Session Equipment Utilized During Treatment: Gait belt Activity Tolerance: Patient tolerated treatment well Patient left: in bed;in CPM;with call bell/phone within reach     Time: 1455-1525 PT Time Calculation (min) (ACUTE ONLY): 30 min  Charges:  $Gait Training: 8-22 mins $Therapeutic Exercise: 8-22 mins                    G CodesRayetta Humphrey, PT CLT [phone] 02/07/2015, 3:31 PM

## 2015-02-07 NOTE — Progress Notes (Addendum)
Postoperative note  Postoperative day number  2  Status post left total knee arthroplasty  Vital signs  BP 143/88 mmHg  Pulse 100  Temp(Src) 98.3 F (36.8 C) (Oral)  Resp 18  Ht 5\' 6"  (1.676 m)  Wt 163 lb (73.936 kg)  BMI 26.32 kg/m2  SpO2 100%  Pertinent labs   CBC Latest Ref Rng 02/07/2015 02/06/2015 01/31/2015  WBC 4.0 - 10.5 K/uL 11.5(H) 9.6 6.4  Hemoglobin 12.0 - 15.0 g/dL 9.4(L) 11.2(L) 13.1  Hematocrit 36.0 - 46.0 % 27.1(L) 32.9(L) 38.2  Platelets 150 - 400 K/uL 141(L) 146(L) 177      Patient complaints  of soreness in the thigh area has improved the knee pain is a little bit more than yesterday  Physical exam  dressing change today drain removed. Wound looks clean. Calf supple. Calf soft. Homans sign negative. Patient ambulating well and flexing the knee 70, ambulation 80 feet  Assessment and plan   Continue physical therapy discharge in the morning

## 2015-02-08 LAB — CBC
HCT: 24 % — ABNORMAL LOW (ref 36.0–46.0)
Hemoglobin: 8.3 g/dL — ABNORMAL LOW (ref 12.0–15.0)
MCH: 31.3 pg (ref 26.0–34.0)
MCHC: 34.6 g/dL (ref 30.0–36.0)
MCV: 90.6 fL (ref 78.0–100.0)
Platelets: 121 10*3/uL — ABNORMAL LOW (ref 150–400)
RBC: 2.65 MIL/uL — ABNORMAL LOW (ref 3.87–5.11)
RDW: 14.2 % (ref 11.5–15.5)
WBC: 7.6 10*3/uL (ref 4.0–10.5)

## 2015-02-08 MED ORDER — OXYCODONE-ACETAMINOPHEN 5-325 MG PO TABS: 1.0000 | ORAL_TABLET | ORAL | Status: DC

## 2015-02-08 MED ORDER — ASPIRIN 325 MG PO TBEC: 325.0000 mg | DELAYED_RELEASE_TABLET | Freq: Two times a day (BID) | ORAL | Status: DC

## 2015-02-08 MED ORDER — TIZANIDINE HCL 2 MG PO TABS: 2.0000 mg | ORAL_TABLET | Freq: Three times a day (TID) | ORAL | Status: DC | PRN

## 2015-02-08 NOTE — Discharge Summary (Signed)
Physician Discharge Summary  Patient ID: Colleen Lee MRN: [id number] DOB/AGE: Nov 05, 1955 59 y.o.  Admit date: 02/05/2015 Discharge date: 02/08/2015  Admission Diagnoses: Osteoarthritis left knee  Discharge Diagnoses: Osteoarthritis left knee Active Problems:   Arthritis of knee, degenerative   Discharged Condition: stable  Hospital Course:  July 6 patient had uncomplicated left total knee arthroplasty with Depew implant.  July 7 postop day 1 Foley catheter removed patient placed in CPM and start a physical therapy advanced well  Patient continued to do well on postop day 2 ambulating with her walker weightbearing as tolerated progressing with knee flexion up to 75  On July 9 the patient was stable vital signs were stable and wound was clean and the patient was discharged   Discharge Exam: Blood pressure 115/63, pulse 74, temperature 98.4 F (36.9 C), temperature source Oral, resp. rate 14, height 5\' 6"  (1.676 m), weight 163 lb (73.936 kg), SpO2 100 %.   Disposition: 01-Home or Self Care  Discharge Instructions    CPM    Complete by:  As directed   Continuous passive motion machine (CPM):      Use the CPM from 0 to 70 for 8 hours per day.      You may increase by 10 per day.  You may break it up into 2 or 3 sessions per day.      Use CPM for 2 weeks or until you are told to stop.     Call MD / Call 911    Complete by:  As directed   If you experience chest pain or shortness of breath, CALL 911 and be transported to the hospital emergency room.  If you develope a fever above 101 F, pus (white drainage) or increased drainage or redness at the wound, or calf pain, call your surgeon's office.     Change dressing    Complete by:  As directed   Change dressing as needed  with sterile 4 x 4 inch gauze dressing and apply TED hose.  You may clean the incision with alcohol prior to redressing.     Constipation Prevention    Complete by:  As directed   Drink plenty of fluids.   Prune juice may be helpful.  You may use a stool softener, such as Colace (over the counter) 100 mg twice a day.  Use MiraLax (over the counter) for constipation as needed.     Diet - low sodium heart healthy    Complete by:  As directed      Do not put a pillow under the knee. Place it under the heel.    Complete by:  As directed      Driving restrictions    Complete by:  As directed   No driving for 3 weeks     Increase activity slowly as tolerated    Complete by:  As directed      TED hose    Complete by:  As directed   Use stockings (TED hose) for 2 weeks on both leg(s).  You may remove them at night for sleeping.            Medication List    STOP taking these medications        HYDROcodone-acetaminophen 5-325 MG per tablet  Commonly known as:  NORCO/VICODIN     traMADol 50 MG tablet  Commonly known as:  ULTRAM      TAKE these medications        amLODipine  5 MG tablet  Commonly known as:  NORVASC  Take 5 mg by mouth daily.     aspirin 325 MG EC tablet  Take 1 tablet (325 mg total) by mouth 2 (two) times daily.     esomeprazole 40 MG capsule  Commonly known as:  NEXIUM  Take 1 capsule (40 mg total) by mouth every morning.     ibuprofen 800 MG tablet  Commonly known as:  ADVIL,MOTRIN  Take 1 tablet (800 mg total) by mouth every 8 (eight) hours as needed.     lisinopril 20 MG tablet  Commonly known as:  PRINIVIL,ZESTRIL  Take 20 mg by mouth daily.     megestrol 40 MG tablet  Commonly known as:  MEGACE  Take 40 mg by mouth daily.     nicotine 21 mg/24hr patch  Commonly known as:  NICODERM CQ - dosed in mg/24 hours  Place 21 mg onto the skin daily.     ondansetron 8 MG tablet  Commonly known as:  ZOFRAN  Take 1 tablet (8 mg total) by mouth every 8 (eight) hours as needed.     oxyCODONE-acetaminophen 5-325 MG per tablet  Commonly known as:  PERCOCET/ROXICET  Take 1 tablet by mouth every 4 (four) hours.     potassium chloride SA 20 MEQ tablet  Commonly  known as:  K-DUR,KLOR-CON  Take 20 mEq by mouth daily.     rifabutin 150 MG capsule  Commonly known as:  MYCOBUTIN  Take 2 capsules (300 mg total) by mouth daily.     tiZANidine 2 MG tablet  Commonly known as:  ZANAFLEX  Take 1 tablet (2 mg total) by mouth every 8 (eight) hours as needed.     VITAMIN B 12 PO  Take 1 tablet by mouth daily.           Follow-up Information    Follow up with Arther Abbott, MD.   Specialties:  Orthopedic Surgery, Radiology   Contact information:   [address] [place] [address] [phone]       Signed: Arther Abbott 02/08/2015, 9:36 AM

## 2015-02-08 NOTE — Progress Notes (Signed)
Physical Therapy Treatment Patient Details Name: Colleen Lee MRN: [id number] DOB: [birth year] Today's Date: 02/08/2015    History of Present Illness      PT Comments    Pt pleasant and eager to participate.  Independent with bed mobility with no assistance required, cueing for hand and Lt foot placement prior sit to standing with min assistance.  Increased distance with gait training with cueing for posture and to improve step through pattern for more normalized gait mechanics.  Instructed stair training with step to pattern and 2 HHA, pt able to demonstrate appropriate technique following cueing.  Pt left in chair with call bell within reach and ice applied to knee.   Follow Up Recommendations        Equipment Recommendations       Recommendations for Other Services       Precautions / Restrictions Precautions Precautions: None Restrictions Weight Bearing Restrictions: Yes LLE Weight Bearing: Weight bearing as tolerated    Mobility  Bed Mobility Overal bed mobility: Independent             General bed mobility comments: Cues for hooking LLE for self assist.   Transfers Overall transfer level: Modified independent Equipment used: Rolling walker (2 wheeled) Transfers: Sit to/from Stand Sit to Stand: Supervision         General transfer comment: Cueing for hand and Lt foot placement prior sit to stand  Ambulation/Gait Ambulation/Gait assistance: Supervision Ambulation Distance (Feet): 200 Feet Assistive device: Rolling walker (2 wheeled) Gait Pattern/deviations: Step-to pattern   Gait velocity interpretation: Below normal speed for age/gender General Gait Details: very small steps, limited ROM in L knee during active gait cycle.    Stairs Stairs: Yes   Stair Management: Two rails Number of Stairs: 4 General stair comments: Step to pattern instructed  Wheelchair Mobility    Modified Rankin (Stroke Patients Only)       Balance                                     Cognition Arousal/Alertness: Awake/alert Behavior During Therapy: WFL for tasks assessed/performed Overall Cognitive Status: Within Functional Limits for tasks assessed                      Exercises Total Joint Exercises Ankle Circles/Pumps: Both;10 reps;Supine Quad Sets: Both;10 reps;Supine Short Arc Quad: Left;10 reps;Supine Heel Slides: Left;10 reps Hip ABduction/ADduction: Left;10 reps Knee Flexion: AAROM;Strengthening;Seated;Left;10 reps Goniometric ROM: 5-83 AAROM    General Comments        Pertinent Vitals/Pain Pain Score: 5  Pain Location: Lt knee post-op Pain Descriptors / Indicators: Sore    Home Living                      Prior Function            PT Goals (current goals can now be found in the care plan section) Progress towards PT goals: Progressing toward goals    Frequency       PT Plan Current plan remains appropriate    Co-evaluation             End of Session Equipment Utilized During Treatment: Gait belt Activity Tolerance: Patient tolerated treatment well Patient left: in chair;with call bell/phone within reach;with family/visitor present     Time: 1020-1103 PT Time Calculation (min) (ACUTE ONLY): 43 min  Charges:  $Gait  Training: 8-22 mins $Therapeutic Exercise: 8-22 mins                    G Codes:      Aldona Lento 02/08/2015, 11:04 AM

## 2015-02-08 NOTE — Plan of Care (Signed)
Problem: Phase III Progression Outcomes Goal: Activity at appropriate level-compared to baseline (UP IN CHAIR FOR HEMODIALYSIS)  Outcome: Completed/Met Date Met:  02/08/15 With walker Goal: Demonstrates TCDB, IS independently Outcome: Completed/Met Date Met:  02/08/15 Up in chair  Problem: Discharge Progression Outcomes Goal: Staples/sutures removed Outcome: Not Applicable Date Met:  46/04/79 Will be done at surgeons office

## 2015-02-10 ENCOUNTER — Ambulatory Visit (HOSPITAL_COMMUNITY): Payer: Commercial Managed Care - HMO | Attending: Internal Medicine | Admitting: Physical Therapy

## 2015-02-10 DIAGNOSIS — Z7409 Other reduced mobility: Secondary | ICD-10-CM | POA: Diagnosis not present

## 2015-02-10 DIAGNOSIS — Z471 Aftercare following joint replacement surgery: Secondary | ICD-10-CM | POA: Diagnosis not present

## 2015-02-10 DIAGNOSIS — Z96652 Presence of left artificial knee joint: Secondary | ICD-10-CM | POA: Diagnosis not present

## 2015-02-10 DIAGNOSIS — M25662 Stiffness of left knee, not elsewhere classified: Secondary | ICD-10-CM | POA: Diagnosis not present

## 2015-02-10 DIAGNOSIS — R6889 Other general symptoms and signs: Secondary | ICD-10-CM

## 2015-02-10 DIAGNOSIS — M25562 Pain in left knee: Secondary | ICD-10-CM | POA: Diagnosis not present

## 2015-02-10 DIAGNOSIS — M179 Osteoarthritis of knee, unspecified: Secondary | ICD-10-CM | POA: Diagnosis not present

## 2015-02-10 DIAGNOSIS — R269 Unspecified abnormalities of gait and mobility: Secondary | ICD-10-CM | POA: Insufficient documentation

## 2015-02-10 DIAGNOSIS — Z789 Other specified health status: Secondary | ICD-10-CM

## 2015-02-10 NOTE — Therapy (Signed)
[place], [place], [address] Phone: [phone]   Fax:  [phone]  Physical Therapy Evaluation  Patient Details  Name: Colleen Lee MRN: [id number] Date of Birth: 02/20/1956 Referring Provider:  Rosita Fire, MD  Encounter Date: 02/10/2015      PT End of Session - 02/10/15 1656    Visit Number 1   Number of Visits 12   Date for PT Re-Evaluation 03/10/15   Authorization Type [employer]    Authorization Time Period 02/10/15 to 04/13/15   Authorization - Visit Number 1   Authorization - Number of Visits 10   PT Start Time 4970   PT Stop Time 1550   PT Time Calculation (min) 34 min   Activity Tolerance Patient tolerated treatment well;Patient limited by pain   Behavior During Therapy [facility] for tasks assessed/performed      Past Medical History  Diagnosis Date  . Hypertension   . Fibroids   . [name]     HCV RNA positive 09/2012  . GERD (gastroesophageal reflux disease)   . H. pylori infection 2014    treated with pylera  . Asthma   . PONV (postoperative nausea and vomiting)     pt thinks maybe once she had N&V  . Cirrhosis     Metavir score F4 on elastography 2015  . Fibromyalgia     Past Surgical History  Procedure Laterality Date  . Knee surgery      left knee  . Colonoscopy  01/18/2008    YOV:ZCHYIF rectum.  Long redundant colon, a diminutive sigmoid polyp status post cold biopsy removed. Hyperplastic polyp. Repeat colonoscopy June 2014 due to family history of colon cancer  . Esophagogastroduodenoscopy  01/18/2008    RMR: Normal esophagus, normal  stomach  . Colonoscopy with esophagogastroduodenoscopy (egd) N/A 11/02/2012    OYD:XAJOINOM gastric mucosa of doubtful, +H.pylori. Incomplete colonoscopy due to patient unable to tolerate exam, proximal colon seen. Patient refused ACBE.  . Colonoscopy with propofol N/A 03/08/2013    Dr. Gala Romney: colonic polyp-removed as scribed above.  Internal Hemorrhoids. Pathology did not reveal any colonic tissue, only mucus. SURVEILLANCE DUE Aug 2019  . Polypectomy N/A 03/08/2013    Procedure: POLYPECTOMY;  Surgeon: Daneil Dolin, MD;  Location: [facility];  Service: Endoscopy;  Laterality: N/A;  . Multiple extractions with alveoloplasty N/A 10/15/2013    Procedure: MULTIPLE EXTRACTION WITH ALVEOLOPLASTY;  Surgeon: Gae Bon, DDS;  Location: [facility];  Service: Oral Surgery;  Laterality: N/A;  . Total knee arthroplasty Left 02/05/2015    Procedure: LEFT TOTAL KNEE ARTHROPLASTY;  Surgeon: Carole Civil, MD;  Location: [facility];  Service: Orthopedics;  Laterality: Left;    There were no vitals filed for this visit.  Visit Diagnosis:  Status post total left knee replacement - Plan: PT plan of care cert/re-cert  Left knee pain - Plan: PT plan of care cert/re-cert  Knee stiffness, left - Plan: PT plan of care cert/re-cert  Abnormality of gait - Plan: PT plan of care cert/re-cert  Difficulty navigating stairs - Plan: PT plan of care cert/re-cert  Impaired functional mobility and activity tolerance - Plan: PT plan of care cert/re-cert      Subjective Assessment - 02/10/15 1520    Subjective Since she had the surgery, she states things have been pretty rough, fairly painful usually.    Pertinent History L TKR was performed on Wednesday (July 6th); received PT in acute setting and states  she is not getting HHPT right now.    How long can you stand comfortably? 2-3 minutes    How long can you walk comfortably? Sometimes patient does have discomfort when walking; can walk around 44feet right now    Currently in Pain? Yes   Pain Score 10-Worst pain ever   Pain Location Knee   Pain Orientation Left            [facility] PT Assessment - 02/10/15 0001    Assessment   Medical Diagnosis L TKR    Onset Date/Surgical Date 02/05/15   Next MD Visit Not sure of date but is scheduled    Precautions   Precautions None   Restrictions   Weight  Bearing Restrictions No   Balance Screen   Has the patient fallen in the past 6 months No   Has the patient had a decrease in activity level because of a fear of falling?  Yes   Is the patient reluctant to leave their home because of a fear of falling?  No   Prior Function   Level of Independence Independent;Independent with basic ADLs;Independent with gait;Independent with transfers   Vocation On disability   Leisure plant flowers, yard scaping, etc    Posture/Postural Control   Posture Comments increased lumbar lordosis, forward head, IR shoulders B, flexed at hips, reduced weight bearing L LE with no heel touch L LE, knee flexion in stance L LE    AROM   Right Hip External Rotation  --  full range    Right Hip Internal Rotation  38   Left Hip External Rotation  23   Left Hip Internal Rotation  20   Left Knee Extension 10  pain limited    Left Knee Flexion 43  pain limited, pt would not allow PT to take knee further    Strength   Right Hip Flexion 4+/5   Right Hip ABduction 3+/5   Left Hip Flexion 2+/5   Left Hip ABduction 3-/5   Right Knee Flexion 4/5   Right Knee Extension 4/5   Left Knee Flexion 2+/5   Left Knee Extension 3-/5   Right Ankle Dorsiflexion 5/5   Left Ankle Dorsiflexion 4/5  pain limited   Ambulation/Gait   Gait Comments flexed at hips, reduced TKE L, reduced weight bearking L, antalgic gait, reduced rotation hips and trunk, reduced stance L/step R                            PT Education - 02/10/15 1655    Education provided Yes   Education Details prognosis, plan of care moving forward, HEP    Person(s) Educated Patient   Methods Explanation;Demonstration;Handout   Comprehension Verbalized understanding;Need further instruction          PT Short Term Goals - 02/10/15 1711    PT SHORT TERM GOAL #1   Title Patient will demonstrate L knee ROM of 0-100 degrees    Time 2   Period Weeks   Status New   PT SHORT TERM GOAL #2    Title Patient to demonstrate L LE strength of at least 4-/5 and proximal muscle strength of at least 4-/5    Time 2   Period Weeks   Status New   PT SHORT TERM GOAL #3   Title Patient will demonstrate the ability to ambulate unlimited distances with SPC and pain no more than 2/10 L knee  Time 2   Period Weeks   Status New   PT SHORT TERM GOAL #4   Title Patient will be independent in correctly and consistently performing HEP to be updated PRN    Time 2   Period Weeks   Status New           PT Long Term Goals - 2015-03-06 1718    PT LONG TERM GOAL #1   Title Patient will demonstrate L knee ROM of 0 degrees extension to at least 120 degrees flexion    Time 4   Period Weeks   Status New   PT LONG TERM GOAL #2   Title Patient will demonstrate bilateral LE strength of at least 4+/5 and proximal muscle strength at least 4/5    Time 4   Period Weeks   Status New   PT LONG TERM GOAL #3   Title Patient will be able to ambulatue unlimited distances over even and uneven surfaces without assistive device and pain 0/10 L knee    Time 4   Period Weeks   Status New   PT LONG TERM GOAL #4   Title Patient will be able to ascend and descend full flight of stairs with no railings, no circumduction, and no unsteadiness throughout, pain L LE 0/10   Time 4   Period Weeks   Status New               Plan - 2015/03/06 1700    Clinical Impression Statement Patient presents with pain L knee, reduced L knee range of motion, reduced L LE and proximal muscle strength, bilateral hip stiffness, postural and gait deviations, reduced functional activity tolerance, and reduced functional task performance skills. The patient just recently had her replacement surgery done on the 6th of July. Patient appears motivated to fully particiapte in skilled PT services and will benefit from skilled PT services in order to address her impairments and assist her in reaching an optimal level of function.     Pt  will benefit from skilled therapeutic intervention in order to improve on the following deficits Abnormal gait;Decreased coordination;Difficulty walking;Decreased endurance;Decreased activity tolerance;Decreased skin integrity;Pain;Decreased balance;Hypomobility;Decreased scar mobility;Decreased mobility;Decreased strength;Postural dysfunction   Rehab Potential Good   PT Frequency 3x / week   PT Duration 4 weeks   PT Treatment/Interventions ADLs/Self Care Home Management;Gait training;Stair training;Functional mobility training;Therapeutic activities;Therapeutic exercise;Balance training;Neuromuscular re-education;Patient/family education;Manual techniques;Scar mobilization   PT Next Visit Plan review HEP and goals; functional stretching and strengthening;  manual PRN    PT Home Exercise Plan given    Consulted and Agree with Plan of Care Patient          G-Codes - Mar 06, 2015 1733    Functional Assessment Tool Used Based on skilled clinical assessment of ROM, strength, pain, gait, functional mobility    Functional Limitation Mobility: Walking and moving around   Mobility: Walking and Moving Around Current Status ([id number]) At least 60 percent but less than 80 percent impaired, limited or restricted   Mobility: Walking and Moving Around Goal Status [phone]) At least 40 percent but less than 60 percent impaired, limited or restricted       Problem List Patient Active Problem List   Diagnosis Date Noted  . Arthritis of knee, degenerative 02/05/2015  . History of Helicobacter pylori infection 10/30/2014  . Chronic [name] with cirrhosis 04/11/2014  . De Quervain's disease (radial styloid tenosynovitis) 12/25/2013  . Anorexia 11/21/2012  . FH: colon cancer 11/21/2012  . Early  satiety 10/25/2012  . Bowel habit changes 10/25/2012  . Abdominal pain, epigastric 10/25/2012  . Abdominal bloating 10/25/2012  . Constipation 10/25/2012  . Abnormal weight loss 10/25/2012  . Chronic viral  [name] 10/25/2012  . Radicular pain of left lower extremity 09/28/2012  . Back pain 09/28/2012  . Sciatica 08/10/2011  . S/P arthroscopy of left knee 08/10/2011  . Tibial plateau fracture 08/10/2011  . Pain in joint, lower leg 02/12/2011  . Stiffness of joint, not elsewhere classified, lower leg 02/12/2011  . 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 Meniscus, medial, derangement 12/29/2010  . CLOSED FRACTURE OF UPPER END OF TIBIA 08/12/2010    Deniece Ree PT, DPT [facility] [address] [place], [place], [address] Phone: [phone]   Fax:  [phone]

## 2015-02-10 NOTE — Patient Instructions (Signed)
KEEP UP WITH ALL OF THE EXERCISES YOU WERE GIVEN AT [facility]; ALSO ADD THESE THREE:  Functional Quadriceps: Sit to Stand   Sit on edge of chair, feet flat on floor. Stand upright, extending knees fully. Repeat __5-10__ times per set. Do __1__ sets per session. Do ___2_ sessions per day.  http://orth.exer.us/735      Copyright  VHI. All rights reserved.   Lateral Weight Shifts  Shift weight on either feet; do this exercise at the kitchen counter or with your walker. Make sure you are really bearing weight down through the left leg as much as you are able to tolerate.   Repeat 20 times, twice a day.    STANDING HEEL RAISES  While standing, raise up on your toes as you lift your heels off the ground. Hold onto kitchen counter or your walker while doing this exercise.   Repeat 10-15 times, twice a day.

## 2015-02-11 ENCOUNTER — Ambulatory Visit (INDEPENDENT_AMBULATORY_CARE_PROVIDER_SITE_OTHER): Payer: Commercial Managed Care - HMO | Admitting: Orthopedic Surgery

## 2015-02-11 ENCOUNTER — Encounter: Payer: Self-pay | Admitting: Orthopedic Surgery

## 2015-02-11 ENCOUNTER — Telehealth: Payer: Self-pay | Admitting: Orthopedic Surgery

## 2015-02-11 VITALS — BP 151/90 | Ht 66.0 in | Wt 163.0 lb

## 2015-02-11 DIAGNOSIS — Z96652 Presence of left artificial knee joint: Secondary | ICD-10-CM

## 2015-02-11 DIAGNOSIS — Z4789 Encounter for other orthopedic aftercare: Secondary | ICD-10-CM

## 2015-02-11 NOTE — Telephone Encounter (Signed)
Have the nurse jaime look at the wound

## 2015-02-11 NOTE — Telephone Encounter (Signed)
Patient is coming in this afternoon to see York Cerise

## 2015-02-11 NOTE — Progress Notes (Signed)
Patient ID: Colleen Lee, female   DOB: [birth year], 59 y.o.   MRN: [id number]  Follow up visit  Chief Complaint  Patient presents with  . Wound Check    wound check LEFT TKA, DOS 02/05/15    BP 151/90 mmHg  Ht 5\' 6"  (1.676 m)  Wt 163 lb (73.936 kg)  BMI 26.32 kg/m2  Encounter Diagnoses  Name Primary?  . Surgical aftercare, musculoskeletal system Yes  . Status post left knee replacement      the patient wanted her leg checked. She saw some bruising on the sides of the knee. The knee looks fine she had some subcutaneous bleeding or change the dressing she's in outpatient physical therapy she is weightbearing as tolerated with her walker she's doing well postop day #6

## 2015-02-11 NOTE — Telephone Encounter (Signed)
Patient had knee surgery 02/05/15 and she is calling today asking should her knee on both sides be red and bruising please advise?

## 2015-02-12 ENCOUNTER — Ambulatory Visit (HOSPITAL_COMMUNITY): Payer: Commercial Managed Care - HMO | Admitting: Physical Therapy

## 2015-02-12 DIAGNOSIS — M25562 Pain in left knee: Secondary | ICD-10-CM

## 2015-02-12 DIAGNOSIS — R269 Unspecified abnormalities of gait and mobility: Secondary | ICD-10-CM | POA: Diagnosis not present

## 2015-02-12 DIAGNOSIS — Z96652 Presence of left artificial knee joint: Secondary | ICD-10-CM

## 2015-02-12 DIAGNOSIS — Z789 Other specified health status: Secondary | ICD-10-CM

## 2015-02-12 DIAGNOSIS — R6889 Other general symptoms and signs: Secondary | ICD-10-CM

## 2015-02-12 DIAGNOSIS — M25662 Stiffness of left knee, not elsewhere classified: Secondary | ICD-10-CM

## 2015-02-12 DIAGNOSIS — Z7409 Other reduced mobility: Secondary | ICD-10-CM | POA: Diagnosis not present

## 2015-02-12 DIAGNOSIS — Z471 Aftercare following joint replacement surgery: Secondary | ICD-10-CM | POA: Diagnosis not present

## 2015-02-12 NOTE — Patient Instructions (Signed)
Strengthening: Quadriceps Set   Tighten muscles on top of thighs by pushing knees down into surface. Hold _3___ seconds. [id number] ____ times per set. Do ___1_ sets per session. Do ____3 sessions per day.  http://orth.exer.us/602   Copyright  VHI. All rights reserved.  Self-Mobilization: Heel Slide (Supine)   Slide left heel toward buttocks until a gentle stretch is felt. Hold _3___ seconds. Relax. Repeat _10___ times per set. Do ___1_ sets per session. Do _2___ sessions per day.  http://orth.exer.us/710   Copyright  VHI. All rights reserved.  Stretching: Hamstring (Supine)  30 Supporting right thigh behind knee, slowly straighten knee until stretch is felt in back of thigh. Hold ____ seconds. Repeat __3__ times per set. Do ____1 sets per session. Do ___2_ sessions per day.  http://orth.exer.us/656   Copyright  VHI. All rights reserved.  Bridging   Slowly raise buttocks from floor, keeping stomach tight. Repeat __15__ times per set. Do ____ sets per session. Do ___2_ sessions per day.  http://orth.exer.us/1096   Copyright  VHI. All rights reserved.  Strengthening: Straight Leg Raise (Phase 1)   Tighten muscles on front of right thigh, then lift leg 15____ inches from surface, keeping knee locked.  Repeat _15___ times per set. Do _1___ sets per session. Do _2___ sessions per day.  http://orth.exer.us/614   Copyright  VHI. All rights reserved.  Strengthening: Hip Abduction (Side-Lying)   Tighten muscles on front of left thigh, then lift leg _12___ inches from surface, keeping knee locked.  Repeat _15___ times per set. Do _1___ sets per session. Do __2__ sessions per day.  http://orth.exer.us/622   Copyright  VHI. All rights reserved.  Knee Extension (Sitting)   Place ___0_ pound weight on left ankle and straighten knee fully, lower slowly. Repeat _15___ times per set. Do ___1_ sets per session. Do 2____ sessions per day.  http://orth.exer.us/732    Copyright  VHI. All rights reserved.

## 2015-02-12 NOTE — Progress Notes (Signed)
Quick Note:  GOOD NEWS! H.pylori eradicated!  ______

## 2015-02-12 NOTE — Therapy (Signed)
[place], [place], [address] Phone: [phone]   Fax:  [phone]  Physical Therapy Treatment  Patient Details  Name: Colleen Lee MRN: [id number] Date of Birth: 08-18-1955 Referring Provider:  Rosita Fire, MD  Encounter Date: 02/12/2015      PT End of Session - 02/12/15 1227    Visit Number 2   Number of Visits 12   Date for PT Re-Evaluation 03/10/15   Authorization Type [employer]    Authorization Time Period 02/10/15 to 04/13/15   Authorization - Visit Number 2   Authorization - Number of Visits 10   PT Start Time 1150   PT Stop Time 1505   PT Time Calculation (min) 45 min   Equipment Utilized During Treatment Gait belt   Activity Tolerance Patient tolerated treatment well      Past Medical History  Diagnosis Date  . Hypertension   . Fibroids   . [name]     HCV RNA positive 09/2012  . GERD (gastroesophageal reflux disease)   . H. pylori infection 2014    treated with pylera  . Asthma   . PONV (postoperative nausea and vomiting)     pt thinks maybe once she had N&V  . Cirrhosis     Metavir score F4 on elastography 2015  . Fibromyalgia     Past Surgical History  Procedure Laterality Date  . Knee surgery      left knee  . Colonoscopy  01/18/2008    WPV:XYIAXK rectum.  Long redundant colon, a diminutive sigmoid polyp status post cold biopsy removed. Hyperplastic polyp. Repeat colonoscopy June 2014 due to family history of colon cancer  . Esophagogastroduodenoscopy  01/18/2008    RMR: Normal esophagus, normal  stomach  . Colonoscopy with esophagogastroduodenoscopy (egd) N/A 11/02/2012    PVV:ZSMOLMBE gastric mucosa of doubtful, +H.pylori. Incomplete colonoscopy due to patient unable to tolerate exam, proximal colon seen. Patient refused ACBE.  . Colonoscopy with propofol N/A 03/08/2013    Dr. Gala Romney: colonic polyp-removed as scribed above. Internal Hemorrhoids. Pathology did not  reveal any colonic tissue, only mucus. SURVEILLANCE DUE Aug 2019  . Polypectomy N/A 03/08/2013    Procedure: POLYPECTOMY;  Surgeon: Daneil Dolin, MD;  Location: [facility];  Service: Endoscopy;  Laterality: N/A;  . Multiple extractions with alveoloplasty N/A 10/15/2013    Procedure: MULTIPLE EXTRACTION WITH ALVEOLOPLASTY;  Surgeon: Gae Bon, DDS;  Location: [facility];  Service: Oral Surgery;  Laterality: N/A;  . Total knee arthroplasty Left 02/05/2015    Procedure: LEFT TOTAL KNEE ARTHROPLASTY;  Surgeon: Carole Civil, MD;  Location: [facility];  Service: Orthopedics;  Laterality: Left;    There were no vitals filed for this visit.  Visit Diagnosis:  Status post total left knee replacement  Left knee pain  Knee stiffness, left  Abnormality of gait  Difficulty navigating stairs      Subjective Assessment - 02/12/15 1151    Subjective Pt states she is doing her exercises done twice a day    Pain Score 8    Pain Location Knee   Pain Orientation Left   Pain Descriptors / Indicators Aching                         [facility] Adult PT Treatment/Exercise - 02/12/15 0001    Exercises   Exercises Knee/Hip   Knee/Hip Exercises: Stretches   Active Hamstring Stretch Left;3 reps;30 seconds  Knee/Hip Exercises: Seated   Long Arc Quad Left;10 reps   Knee/Hip Exercises: Supine   Quad Sets 15 reps   Heel Slides 10 reps   Terminal Knee Extension 10 reps   Bridges 10 reps   Knee Extension --  10   Knee Extension Limitations PROM-gentle   Knee Flexion --  77   Knee Flexion Limitations PROM -gentle    Knee/Hip Exercises: Sidelying   Hip ABduction AAROM   Modalities   Modalities Cryotherapy   Cryotherapy   Number Minutes Cryotherapy 10 Minutes   Cryotherapy Location Knee   Type of Cryotherapy Ice pack                PT Education - 02/12/15 1226    Education provided Yes   Education Details heel toe gait given HEP    Person(s) Educated Patient   Methods  Explanation;Demonstration;Handout   Comprehension Verbalized understanding;Returned demonstration          PT Short Term Goals - 02/10/15 1711    PT SHORT TERM GOAL #1   Title Patient will demonstrate L knee ROM of 0-100 degrees    Time 2   Period Weeks   Status New   PT SHORT TERM GOAL #2   Title Patient to demonstrate L LE strength of at least 4-/5 and proximal muscle strength of at least 4-/5    Time 2   Period Weeks   Status New   PT SHORT TERM GOAL #3   Title Patient will demonstrate the ability to ambulate unlimited distances with SPC and pain no more than 2/10 L knee    Time 2   Period Weeks   Status New   PT SHORT TERM GOAL #4   Title Patient will be independent in correctly and consistently performing HEP to be updated PRN    Time 2   Period Weeks   Status New           PT Long Term Goals - 02/10/15 1718    PT LONG TERM GOAL #1   Title Patient will demonstrate L knee ROM of 0 degrees extension to at least 120 degrees flexion    Time 4   Period Weeks   Status New   PT LONG TERM GOAL #2   Title Patient will demonstrate bilateral LE strength of at least 4+/5 and proximal muscle strength at least 4/5    Time 4   Period Weeks   Status New   PT LONG TERM GOAL #3   Title Patient will be able to ambulatue unlimited distances over even and uneven surfaces without assistive device and pain 0/10 L knee    Time 4   Period Weeks   Status New   PT LONG TERM GOAL #4   Title Patient will be able to ascend and descend full flight of stairs with no railings, no circumduction, and no unsteadiness throughout, pain L LE 0/10   Time 4   Period Weeks   Status New               Plan - 02/12/15 1227    Clinical Impression Statement Pt treatment focused on ROM and pain.  Pt demonstrated increased ROM; edema was addressed with an IP and pt was encouraged to ice frequently at home.  Pt verbally cued to increase ankle dorsiflexion/ plantarflexion and knee flexion with  gait.    PT Next Visit Plan begin standing heel raises and functional squats next treatment.  Problem List Patient Active Problem List   Diagnosis Date Noted  . Arthritis of knee, degenerative 02/05/2015  . History of Helicobacter pylori infection 10/30/2014  . Chronic [name] with cirrhosis 04/11/2014  . De Quervain's disease (radial styloid tenosynovitis) 12/25/2013  . Anorexia 11/21/2012  . FH: colon cancer 11/21/2012  . Early satiety 10/25/2012  . Bowel habit changes 10/25/2012  . Abdominal pain, epigastric 10/25/2012  . Abdominal bloating 10/25/2012  . Constipation 10/25/2012  . Abnormal weight loss 10/25/2012  . Chronic viral [name] 10/25/2012  . Radicular pain of left lower extremity 09/28/2012  . Back pain 09/28/2012  . Sciatica 08/10/2011  . S/P arthroscopy of left knee 08/10/2011  . Tibial plateau fracture 08/10/2011  . Pain in joint, lower leg 02/12/2011  . Stiffness of joint, not elsewhere classified, lower leg 02/12/2011  . 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 Meniscus, medial, derangement 12/29/2010  . CLOSED FRACTURE OF UPPER END OF TIBIA 08/12/2010   Rayetta Humphrey, PT CLT [phone] 02/12/2015, 12:39 PM  [facility] [address] [place], [place], [address] Phone: [phone]   Fax:  [phone]

## 2015-02-13 ENCOUNTER — Ambulatory Visit (HOSPITAL_COMMUNITY): Payer: Commercial Managed Care - HMO | Admitting: Physical Therapy

## 2015-02-13 DIAGNOSIS — M25562 Pain in left knee: Secondary | ICD-10-CM | POA: Diagnosis not present

## 2015-02-13 DIAGNOSIS — R269 Unspecified abnormalities of gait and mobility: Secondary | ICD-10-CM

## 2015-02-13 DIAGNOSIS — M25662 Stiffness of left knee, not elsewhere classified: Secondary | ICD-10-CM | POA: Diagnosis not present

## 2015-02-13 DIAGNOSIS — Z7409 Other reduced mobility: Secondary | ICD-10-CM

## 2015-02-13 DIAGNOSIS — Z471 Aftercare following joint replacement surgery: Secondary | ICD-10-CM | POA: Diagnosis not present

## 2015-02-13 DIAGNOSIS — Z96652 Presence of left artificial knee joint: Secondary | ICD-10-CM

## 2015-02-13 NOTE — Therapy (Signed)
[place], [place], [address] Phone: [phone]   Fax:  [phone]  Physical Therapy Treatment  Patient Details  Name: Colleen Lee MRN: [id number] Date of Birth: Aug 19, 1955 Referring Provider:  Rosita Fire, MD  Encounter Date: 02/13/2015      PT End of Session - 02/13/15 1056    Visit Number 3   Number of Visits 12   Date for PT Re-Evaluation 03/10/15   Authorization Type [employer]    Authorization Time Period 02/10/15 to 04/13/15   Authorization - Visit Number 3   Authorization - Number of Visits 10   PT Start Time 0017   PT Stop Time 1103   PT Time Calculation (min) 48 min   Activity Tolerance Patient tolerated treatment well;Patient limited by pain   Behavior During Therapy [facility] for tasks assessed/performed      Past Medical History  Diagnosis Date  . Hypertension   . Fibroids   . [name]     HCV RNA positive 09/2012  . GERD (gastroesophageal reflux disease)   . H. pylori infection 2014    treated with pylera  . Asthma   . PONV (postoperative nausea and vomiting)     pt thinks maybe once she had N&V  . Cirrhosis     Metavir score F4 on elastography 2015  . Fibromyalgia     Past Surgical History  Procedure Laterality Date  . Knee surgery      left knee  . Colonoscopy  01/18/2008    CBS:WHQPRF rectum.  Long redundant colon, a diminutive sigmoid polyp status post cold biopsy removed. Hyperplastic polyp. Repeat colonoscopy June 2014 due to family history of colon cancer  . Esophagogastroduodenoscopy  01/18/2008    RMR: Normal esophagus, normal  stomach  . Colonoscopy with esophagogastroduodenoscopy (egd) N/A 11/02/2012    FMB:[id number] gastric mucosa of doubtful, +H.pylori. Incomplete colonoscopy due to patient unable to tolerate exam, proximal colon seen. Patient refused ACBE.  . Colonoscopy with propofol N/A 03/08/2013    Dr. Gala Romney: colonic polyp-removed as scribed above. Internal  Hemorrhoids. Pathology did not reveal any colonic tissue, only mucus. SURVEILLANCE DUE Aug 2019  . Polypectomy N/A 03/08/2013    Procedure: POLYPECTOMY;  Surgeon: Daneil Dolin, MD;  Location: [facility];  Service: Endoscopy;  Laterality: N/A;  . Multiple extractions with alveoloplasty N/A 10/15/2013    Procedure: MULTIPLE EXTRACTION WITH ALVEOLOPLASTY;  Surgeon: Gae Bon, DDS;  Location: [place];  Service: Oral Surgery;  Laterality: N/A;  . Total knee arthroplasty Left 02/05/2015    Procedure: LEFT TOTAL KNEE ARTHROPLASTY;  Surgeon: Carole Civil, MD;  Location: [facility];  Service: Orthopedics;  Laterality: Left;    There were no vitals filed for this visit.  Visit Diagnosis:  Status post total left knee replacement  Left knee pain  Knee stiffness, left  Abnormality of gait  Impaired functional mobility and activity tolerance      Subjective Assessment - 02/13/15 1019    Subjective Pt reports that her leg is still pretty sore, rates pain as a 4/10 today.   Currently in Pain? Yes   Pain Score 4    Pain Location Knee   Pain Orientation Left                 [facility] Adult PT Treatment/Exercise - 02/13/15 0001    Knee/Hip Exercises: Stretches   Active Hamstring Stretch 3 reps;30 seconds   Active Hamstring Stretch Limitations 12  inch step   Knee/Hip Exercises: Standing   Heel Raises 15 reps   Functional Squat 10 reps   Functional Squat Limitations done to chair in // bars for tactile cueing   Knee/Hip Exercises: Seated   Long Arc Quad 15 reps;Left   Knee/Hip Exercises: Supine   Quad Sets 15 reps   Quad Sets Limitations 3 second hold   Short Arc Quad Sets 10 reps   Heel Slides 15 reps   Heel Slides Limitations with rope for AAROM   Bridges 10 reps   Bridges Limitations on bolster   Knee Extension Limitations PROM-gentle   Knee Flexion Limitations PROM -gentle    Modalities   Modalities Cryotherapy   Cryotherapy   Number Minutes Cryotherapy 10 Minutes    Cryotherapy Location Knee   Type of Cryotherapy Ice pack                PT Education - 02/12/15 1226    Education provided Yes   Education Details heel toe gait given HEP    Person(s) Educated Patient   Methods Explanation;Demonstration;Handout   Comprehension Verbalized understanding;Returned demonstration          PT Short Term Goals - 02/10/15 1711    PT SHORT TERM GOAL #1   Title Patient will demonstrate L knee ROM of 0-100 degrees    Time 2   Period Weeks   Status New   PT SHORT TERM GOAL #2   Title Patient to demonstrate L LE strength of at least 4-/5 and proximal muscle strength of at least 4-/5    Time 2   Period Weeks   Status New   PT SHORT TERM GOAL #3   Title Patient will demonstrate the ability to ambulate unlimited distances with SPC and pain no more than 2/10 L knee    Time 2   Period Weeks   Status New   PT SHORT TERM GOAL #4   Title Patient will be independent in correctly and consistently performing HEP to be updated PRN    Time 2   Period Weeks   Status New           PT Long Term Goals - 02/10/15 1718    PT LONG TERM GOAL #1   Title Patient will demonstrate L knee ROM of 0 degrees extension to at least 120 degrees flexion    Time 4   Period Weeks   Status New   PT LONG TERM GOAL #2   Title Patient will demonstrate bilateral LE strength of at least 4+/5 and proximal muscle strength at least 4/5    Time 4   Period Weeks   Status New   PT LONG TERM GOAL #3   Title Patient will be able to ambulatue unlimited distances over even and uneven surfaces without assistive device and pain 0/10 L knee    Time 4   Period Weeks   Status New   PT LONG TERM GOAL #4   Title Patient will be able to ascend and descend full flight of stairs with no railings, no circumduction, and no unsteadiness throughout, pain L LE 0/10   Time 4   Period Weeks   Status New               Plan - 02/13/15 1041    Clinical Impression Statement Treatment  focused on ROM and strengthening of L knee. Pt required encouragement and verbal cueing during AAROM and PROM due to increased reactivity and pain  levels. She was encouraged to continue with heel slides and quad sets at home to improve ROM and quad strength.    PT Next Visit Plan Continue with ROM activities, begin standing flexion self-stretch         Problem List Patient Active Problem List   Diagnosis Date Noted  . Arthritis of knee, degenerative 02/05/2015  . History of Helicobacter pylori infection 10/30/2014  . Chronic [name] with cirrhosis 04/11/2014  . De Quervain's disease (radial styloid tenosynovitis) 12/25/2013  . Anorexia 11/21/2012  . FH: colon cancer 11/21/2012  . Early satiety 10/25/2012  . Bowel habit changes 10/25/2012  . Abdominal pain, epigastric 10/25/2012  . Abdominal bloating 10/25/2012  . Constipation 10/25/2012  . Abnormal weight loss 10/25/2012  . Chronic viral [name] 10/25/2012  . Radicular pain of left lower extremity 09/28/2012  . Back pain 09/28/2012  . Sciatica 08/10/2011  . S/P arthroscopy of left knee 08/10/2011  . Tibial plateau fracture 08/10/2011  . Pain in joint, lower leg 02/12/2011  . Stiffness of joint, not elsewhere classified, lower leg 02/12/2011  . 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 Meniscus, medial, derangement 12/29/2010  . CLOSED FRACTURE OF UPPER END OF TIBIA 08/12/2010    Hilma Favors, PT, DPT [phone] 02/13/2015, 10:57 AM  [place], [place], [address] Phone: [phone]   Fax:  [phone]

## 2015-02-16 ENCOUNTER — Encounter (HOSPITAL_COMMUNITY): Payer: Self-pay | Admitting: *Deleted

## 2015-02-16 ENCOUNTER — Emergency Department (HOSPITAL_COMMUNITY)
Admission: EM | Admit: 2015-02-16 | Discharge: 2015-02-17 | Disposition: A | Discharge status: Home / Self Care | Payer: Commercial Managed Care - HMO | Attending: Emergency Medicine | Admitting: Emergency Medicine

## 2015-02-16 ENCOUNTER — Emergency Department (HOSPITAL_COMMUNITY): Payer: Commercial Managed Care - HMO

## 2015-02-16 DIAGNOSIS — K219 Gastro-esophageal reflux disease without esophagitis: Secondary | ICD-10-CM | POA: Diagnosis not present

## 2015-02-16 DIAGNOSIS — Z8619 Personal history of other infectious and parasitic diseases: Secondary | ICD-10-CM | POA: Diagnosis not present

## 2015-02-16 DIAGNOSIS — Z793 Long term (current) use of hormonal contraceptives: Secondary | ICD-10-CM | POA: Insufficient documentation

## 2015-02-16 DIAGNOSIS — Z79899 Other long term (current) drug therapy: Secondary | ICD-10-CM | POA: Diagnosis not present

## 2015-02-16 DIAGNOSIS — Z720 Tobacco use: Secondary | ICD-10-CM | POA: Insufficient documentation

## 2015-02-16 DIAGNOSIS — M25562 Pain in left knee: Secondary | ICD-10-CM | POA: Insufficient documentation

## 2015-02-16 DIAGNOSIS — Z8739 Personal history of other diseases of the musculoskeletal system and connective tissue: Secondary | ICD-10-CM | POA: Diagnosis not present

## 2015-02-16 DIAGNOSIS — Z86018 Personal history of other benign neoplasm: Secondary | ICD-10-CM | POA: Diagnosis not present

## 2015-02-16 DIAGNOSIS — M79605 Pain in left leg: Secondary | ICD-10-CM | POA: Diagnosis not present

## 2015-02-16 DIAGNOSIS — J45909 Unspecified asthma, uncomplicated: Secondary | ICD-10-CM | POA: Diagnosis not present

## 2015-02-16 DIAGNOSIS — Z452 Encounter for adjustment and management of vascular access device: Secondary | ICD-10-CM | POA: Diagnosis not present

## 2015-02-16 DIAGNOSIS — I10 Essential (primary) hypertension: Secondary | ICD-10-CM | POA: Insufficient documentation

## 2015-02-16 DIAGNOSIS — M25462 Effusion, left knee: Secondary | ICD-10-CM | POA: Diagnosis not present

## 2015-02-16 DIAGNOSIS — R2242 Localized swelling, mass and lump, left lower limb: Secondary | ICD-10-CM | POA: Diagnosis not present

## 2015-02-16 DIAGNOSIS — Z7982 Long term (current) use of aspirin: Secondary | ICD-10-CM | POA: Insufficient documentation

## 2015-02-16 DIAGNOSIS — Z96652 Presence of left artificial knee joint: Secondary | ICD-10-CM | POA: Diagnosis not present

## 2015-02-16 DIAGNOSIS — Z880 Allergy status to penicillin: Secondary | ICD-10-CM | POA: Diagnosis not present

## 2015-02-16 DIAGNOSIS — Z792 Long term (current) use of antibiotics: Secondary | ICD-10-CM | POA: Diagnosis not present

## 2015-02-16 DIAGNOSIS — Z471 Aftercare following joint replacement surgery: Secondary | ICD-10-CM | POA: Diagnosis not present

## 2015-02-16 LAB — CBC WITH DIFFERENTIAL/PLATELET
Basophils Absolute: 0 K/uL (ref 0.0–0.1)
Basophils Relative: 0 % (ref 0–1)
Eosinophils Absolute: 0.3 K/uL (ref 0.0–0.7)
Eosinophils Relative: 3 % (ref 0–5)
HCT: 26.1 % — ABNORMAL LOW (ref 36.0–46.0)
Hemoglobin: 8.8 g/dL — ABNORMAL LOW (ref 12.0–15.0)
Lymphocytes Relative: 19 % (ref 12–46)
Lymphs Abs: 1.8 K/uL (ref 0.7–4.0)
MCH: 30.8 pg (ref 26.0–34.0)
MCHC: 33.7 g/dL (ref 30.0–36.0)
MCV: 91.3 fL (ref 78.0–100.0)
Monocytes Absolute: 0.6 K/uL (ref 0.1–1.0)
Monocytes Relative: 6 % (ref 3–12)
Neutro Abs: 6.6 K/uL (ref 1.7–7.7)
Neutrophils Relative %: 72 % (ref 43–77)
Platelets: 331 K/uL (ref 150–400)
RBC: 2.86 MIL/uL — ABNORMAL LOW (ref 3.87–5.11)
RDW: 14.6 % (ref 11.5–15.5)
WBC: 9.2 K/uL (ref 4.0–10.5)

## 2015-02-16 LAB — BASIC METABOLIC PANEL WITH GFR
Anion gap: 7 (ref 5–15)
BUN: 6 mg/dL (ref 6–20)
CO2: 22 mmol/L (ref 22–32)
Calcium: 9.2 mg/dL (ref 8.9–10.3)
Chloride: 107 mmol/L (ref 101–111)
Creatinine, Ser: 0.67 mg/dL (ref 0.44–1.00)
GFR calc Af Amer: >60 mL/min
GFR calc non Af Amer: >60 mL/min
Glucose, Bld: 98 mg/dL (ref 65–99)
Potassium: 4 mmol/L (ref 3.5–5.1)
Sodium: 136 mmol/L (ref 135–145)

## 2015-02-16 MED ORDER — OXYCODONE-ACETAMINOPHEN 5-325 MG PO TABS
2.0000 | ORAL_TABLET | Freq: Once | ORAL | Status: AC
  Administered 2015-02-16: 2 via ORAL
  Filled 2015-02-16: qty 2

## 2015-02-16 MED ORDER — ENOXAPARIN SODIUM 100 MG/ML ~~LOC~~ SOLN
75.0000 mg | Freq: Once | SUBCUTANEOUS | Status: AC
  Administered 2015-02-16: 75 mg via SUBCUTANEOUS
  Filled 2015-02-16: qty 1

## 2015-02-16 MED ORDER — SODIUM CHLORIDE 0.9 % IV BOLUS (SEPSIS)
500.0000 mL | Freq: Once | INTRAVENOUS | Status: AC
  Administered 2015-02-16: 500 mL via INTRAVENOUS

## 2015-02-16 MED ORDER — ONDANSETRON HCL 4 MG/2ML IJ SOLN
4.0000 mg | Freq: Once | INTRAMUSCULAR | Status: AC
  Administered 2015-02-16: 4 mg via INTRAVENOUS
  Filled 2015-02-16: qty 2

## 2015-02-16 MED ORDER — MORPHINE SULFATE 4 MG/ML IJ SOLN
4.0000 mg | Freq: Once | INTRAMUSCULAR | Status: AC
  Administered 2015-02-16: 4 mg via INTRAVENOUS
  Filled 2015-02-16: qty 1

## 2015-02-16 NOTE — ED Notes (Signed)
Pt c/o pain and swelling to left knee; pt states she had knee replacement x 9 days; knee is red and swollen

## 2015-02-16 NOTE — ED Notes (Signed)
Patient has CVC Triple Lumen to Left Subclavian placed during knee replacement surgery and left in place due to patient having difficulty IV access.  CVC has not been flushed on assessed since 02/10/15 per patient, Per EDP Lacinda Axon okay to access CVC line.  Chest x-ray order to verify placement, flushed triple lumen, clean and replaced all caps, applied clean dressing to site at left chest, patient tolerated well.

## 2015-02-16 NOTE — ED Provider Notes (Addendum)
CSN: [id number]     Arrival date & time 02/16/15  2005 History  This chart was scribed for Nat Christen, MD by Irene Pap, ED Scribe. This patient was seen in room APA19/APA19 and patient care was started at 8:49 PM.      Chief Complaint  Patient presents with  . Leg Pain   The history is provided by the patient. No language interpreter was used.  HPI Comments: Colleen Lee is a 59 y.o. female with hx of recent left knee replacement who presents to the Emergency Department complaining of throbbing left knee pain and left ankle swelling onset one day ago. Reports that most pain is on the bilateral sides and posterior portion of the knee. States that her knee replacement was 02/05/15 by Dr. Aline Brochure who changed the knee dressing 3 days ago.  She states that she does PT 3 times a week and does exercises at home. She states that she has been putting ice packs on the area to no relief. Denies any new injuries, trauma or being on blood thinners.   Past Medical History  Diagnosis Date  . Hypertension   . Fibroids   . [name]     HCV RNA positive 09/2012  . GERD (gastroesophageal reflux disease)   . H. pylori infection 2014    treated with pylera  . Asthma   . PONV (postoperative nausea and vomiting)     pt thinks maybe once she had N&V  . Cirrhosis     Metavir score F4 on elastography 2015  . Fibromyalgia    Past Surgical History  Procedure Laterality Date  . Knee surgery      left knee  . Colonoscopy  01/18/2008    SKA:[id number] rectum.  Long redundant colon, a diminutive sigmoid polyp status post cold biopsy removed. Hyperplastic polyp. Repeat colonoscopy June 2014 due to family history of colon cancer  . Esophagogastroduodenoscopy  01/18/2008    RMR: Normal esophagus, normal  stomach  . Colonoscopy with esophagogastroduodenoscopy (egd) N/A 11/02/2012    BWI:OMBTDHRC gastric mucosa of doubtful, +H.pylori. Incomplete colonoscopy due to patient unable to tolerate exam, proximal colon  seen. Patient refused ACBE.  . Colonoscopy with propofol N/A 03/08/2013    Dr. Gala Romney: colonic polyp-removed as scribed above. Internal Hemorrhoids. Pathology did not reveal any colonic tissue, only mucus. SURVEILLANCE DUE Aug 2019  . Polypectomy N/A 03/08/2013    Procedure: POLYPECTOMY;  Surgeon: Daneil Dolin, MD;  Location: [facility];  Service: Endoscopy;  Laterality: N/A;  . Multiple extractions with alveoloplasty N/A 10/15/2013    Procedure: MULTIPLE EXTRACTION WITH ALVEOLOPLASTY;  Surgeon: Gae Bon, DDS;  Location: [place];  Service: Oral Surgery;  Laterality: N/A;  . Total knee arthroplasty Left 02/05/2015    Procedure: LEFT TOTAL KNEE ARTHROPLASTY;  Surgeon: Carole Civil, MD;  Location: [facility];  Service: Orthopedics;  Laterality: Left;   Family History  Problem Relation Age of Onset  . Colon cancer Brother        . Asthma      FH  . Multiple myeloma Brother   . Liver cancer Sister   . Prostate cancer Brother   . Pancreatic cancer Brother   . Cancer Other     niece, age 46, primary unknown   History  Substance Use Topics  . Smoking status: Current Every Day Smoker -- 1.00 packs/day for 40 years    Types: Cigarettes  . Smokeless tobacco: Not on file     Comment:  Smokes one pack of cigarettes daily  . Alcohol Use: No     Comment: former, last Jan 2014   OB History    No data available     Review of Systems A complete 10 system review of systems was obtained and all systems are negative except as noted in the HPI and PMH.   Allergies  Penicillins  Home Medications   Prior to Admission medications   Medication Sig Start Date End Date Taking? Authorizing Provider  amLODipine (NORVASC) 5 MG tablet Take 5 mg by mouth daily.   Yes Historical Provider, MD  aspirin EC 325 MG EC tablet Take 1 tablet (325 mg total) by mouth 2 (two) times daily. 02/08/15  Yes Carole Civil, MD  Cyanocobalamin (VITAMIN B 12 PO) Take 1 tablet by mouth daily.   Yes Historical Provider, MD   cyclobenzaprine (FLEXERIL) 10 MG tablet Take 10 mg by mouth daily. 12/30/14  Yes Historical Provider, MD  esomeprazole (NEXIUM) 40 MG capsule Take 1 capsule (40 mg total) by mouth every morning. 12/19/14  Yes Orvil Feil, NP  ibuprofen (ADVIL,MOTRIN) 800 MG tablet Take 1 tablet (800 mg total) by mouth every 8 (eight) hours as needed. 04/05/14  Yes Carole Civil, MD  lisinopril (PRINIVIL,ZESTRIL) 20 MG tablet Take 20 mg by mouth daily.   Yes Historical Provider, MD  megestrol (MEGACE) 40 MG tablet Take 40 mg by mouth daily.   Yes Historical Provider, MD  ondansetron (ZOFRAN) 8 MG tablet Take 1 tablet (8 mg total) by mouth every 8 (eight) hours as needed. 08/14/14  Yes Ripley Fraise, MD  potassium chloride SA (K-DUR,KLOR-CON) 20 MEQ tablet Take 20 mEq by mouth daily.   Yes Historical Provider, MD  tiZANidine (ZANAFLEX) 2 MG tablet Take 1 tablet (2 mg total) by mouth every 8 (eight) hours as needed. 02/08/15  Yes Carole Civil, MD  nicotine (NICODERM CQ - DOSED IN MG/24 HOURS) 21 mg/24hr patch Place 21 mg onto the skin daily as needed.     Historical Provider, MD  oxyCODONE-acetaminophen (PERCOCET/ROXICET) 5-325 MG per tablet Take 2 tablets by mouth every 4 (four) hours as needed for severe pain. 02/17/15   Nat Christen, MD  rifabutin (MYCOBUTIN) 150 MG capsule Take 2 capsules (300 mg total) by mouth daily. 12/19/14   Orvil Feil, NP   BP 187/85 mmHg  Pulse 100  Temp(Src) 99.6 F (37.6 C) (Oral)  Resp 20  Ht _0  (1.676 m)  Wt 168 lb (76.204 kg)  BMI 27.13 kg/m2  SpO2 100%  Physical Exam  Constitutional: She is oriented to person, place, and time. She appears well-developed and well-nourished.  HENT:  Head: Normocephalic and atraumatic.  Eyes: Conjunctivae and EOM are normal. Pupils are equal, round, and reactive to light.  Neck: Normal range of motion. Neck supple.  Cardiovascular: Normal rate and regular rhythm.   Pulmonary/Chest: Effort normal and breath sounds normal.  Abdominal:  Soft. Bowel sounds are normal.  Musculoskeletal: Normal range of motion.  14 cm vertical scar on left knee with minimal erythema and edema; tenderness to left posterior thigh and calf  Neurological: She is alert and oriented to person, place, and time.  Skin: Skin is warm and dry.  Psychiatric: She has a normal mood and affect. Her behavior is normal.  Nursing note and vitals reviewed.   ED Course  Procedures (including critical care time) DIAGNOSTIC STUDIES: Oxygen Saturation is 100% on RA, normal by my interpretation.    COORDINATION  OF CARE: 8:54 PM-Discussed treatment plan which includes blood work, pain medication and CT scan of knee with pt at bedside and pt agreed to plan.   Labs Review  Imaging Review Dg Chest 2 View  02/16/2015   CLINICAL DATA:  59 year old female status post central line placement.  EXAM: CHEST  2 VIEW  COMPARISON:  Radiograph dated 02/04/2014  FINDINGS: Left subclavian central line with tip over central SVC. The heart size and mediastinal contours are within normal limits. Both lungs are clear. The visualized skeletal structures are unremarkable.  IMPRESSION: Left sided central line with tip over central SVC.  No pneumothorax.   Electronically Signed   By: Anner Crete M.D.   On: 02/16/2015 22:35   Ct Knee Left Wo Contrast  02/16/2015   CLINICAL DATA:  Acute onset of throbbing pain and left ankle swelling. Recent left knee replacement. Initial encounter.  EXAM: CT OF THE LEFT KNEE WITHOUT CONTRAST  TECHNIQUE: Multidetector CT imaging of the left knee was performed according to the standard protocol. Multiplanar CT image reconstructions were also generated.  COMPARISON:  Left knee radiographs performed 02/05/2015, and MRI of the left knee performed 11/18/2010  FINDINGS: The patient's left knee total arthroplasty appears intact, without evidence of loosening or new fracture. However, note is made of a relatively large knee joint effusion. The quadriceps tendon  appears grossly intact. The patellar tendon is difficult to fully assess due to metal artifact. No definite osseous fragments are seen at the joint space. Tiny likely postoperative loose bodies are seen within the joint effusion superior to the patella.  Skin staples are noted along the anterior aspect of the knee. Minimal vascular calcification is noted. There is minimal soft tissue edema tracking about the knee. The visualized musculature is grossly unremarkable in appearance.  IMPRESSION: 1. Left total knee arthroplasty appears intact, without evidence of loosening or fracture. 2. Relatively large knee joint effusion noted. 3. Tiny likely postoperative loose bodies noted within the joint effusion superior to the patella. 4. Minimal soft tissue edema tracking about the knee.   Electronically Signed   By: Garald Balding M.D.   On: 02/16/2015 22:50     EKG Interpretation None      MDM   Final diagnoses:  Lower extremity pain, posterior, left   Discussed results with Dr. Aline Brochure and the patient. Will obtain ultrasound of left lower extremity in the morning to rule out blood clot. Lovenox 1 mg/kg subcutaneously given tonight.  Discharge medication Percocet [#6]  I personally performed the services described in this documentation, which was scribed in my presence. The recorded information has been reviewed and is accurate.    Nat Christen, MD 02/17/15 [id number]  Nat Christen, MD 02/17/15 [phone]

## 2015-02-17 ENCOUNTER — Ambulatory Visit (HOSPITAL_COMMUNITY): Payer: Commercial Managed Care - HMO

## 2015-02-17 ENCOUNTER — Ambulatory Visit (INDEPENDENT_AMBULATORY_CARE_PROVIDER_SITE_OTHER): Payer: Self-pay | Admitting: Orthopedic Surgery

## 2015-02-17 ENCOUNTER — Ambulatory Visit (HOSPITAL_COMMUNITY)
Admit: 2015-02-17 | Discharge: 2015-02-17 | Disposition: A | Discharge status: Home / Self Care | Payer: Commercial Managed Care - HMO | Source: Ambulatory Visit | Attending: Emergency Medicine | Admitting: Emergency Medicine

## 2015-02-17 ENCOUNTER — Encounter: Payer: Self-pay | Admitting: Orthopedic Surgery

## 2015-02-17 VITALS — BP 155/87 | Ht 66.0 in | Wt 168.0 lb

## 2015-02-17 DIAGNOSIS — R269 Unspecified abnormalities of gait and mobility: Secondary | ICD-10-CM | POA: Diagnosis not present

## 2015-02-17 DIAGNOSIS — J45909 Unspecified asthma, uncomplicated: Secondary | ICD-10-CM | POA: Diagnosis not present

## 2015-02-17 DIAGNOSIS — M25562 Pain in left knee: Secondary | ICD-10-CM

## 2015-02-17 DIAGNOSIS — Z720 Tobacco use: Secondary | ICD-10-CM | POA: Diagnosis not present

## 2015-02-17 DIAGNOSIS — Z789 Other specified health status: Secondary | ICD-10-CM

## 2015-02-17 DIAGNOSIS — I10 Essential (primary) hypertension: Secondary | ICD-10-CM | POA: Diagnosis not present

## 2015-02-17 DIAGNOSIS — Z7409 Other reduced mobility: Secondary | ICD-10-CM

## 2015-02-17 DIAGNOSIS — Z471 Aftercare following joint replacement surgery: Secondary | ICD-10-CM | POA: Diagnosis not present

## 2015-02-17 DIAGNOSIS — M79662 Pain in left lower leg: Secondary | ICD-10-CM | POA: Diagnosis not present

## 2015-02-17 DIAGNOSIS — R2242 Localized swelling, mass and lump, left lower limb: Secondary | ICD-10-CM | POA: Diagnosis not present

## 2015-02-17 DIAGNOSIS — M25662 Stiffness of left knee, not elsewhere classified: Secondary | ICD-10-CM

## 2015-02-17 DIAGNOSIS — Z96652 Presence of left artificial knee joint: Secondary | ICD-10-CM

## 2015-02-17 DIAGNOSIS — R6889 Other general symptoms and signs: Secondary | ICD-10-CM

## 2015-02-17 DIAGNOSIS — Z793 Long term (current) use of hormonal contraceptives: Secondary | ICD-10-CM | POA: Diagnosis not present

## 2015-02-17 DIAGNOSIS — Z4789 Encounter for other orthopedic aftercare: Secondary | ICD-10-CM

## 2015-02-17 DIAGNOSIS — K219 Gastro-esophageal reflux disease without esophagitis: Secondary | ICD-10-CM | POA: Diagnosis not present

## 2015-02-17 DIAGNOSIS — Z7982 Long term (current) use of aspirin: Secondary | ICD-10-CM | POA: Diagnosis not present

## 2015-02-17 DIAGNOSIS — Z792 Long term (current) use of antibiotics: Secondary | ICD-10-CM | POA: Diagnosis not present

## 2015-02-17 MED ORDER — GABAPENTIN 100 MG PO CAPS
100.0000 mg | ORAL_CAPSULE | Freq: Three times a day (TID) | ORAL | Status: DC
Start: 2015-02-17 — End: 2015-09-22

## 2015-02-17 MED ORDER — HEPARIN SOD (PORK) LOCK FLUSH 100 UNIT/ML IV SOLN
INTRAVENOUS | Status: AC
  Filled 2015-02-17: qty 5

## 2015-02-17 MED ORDER — OXYCODONE-ACETAMINOPHEN 5-325 MG PO TABS: 2.0000 | ORAL_TABLET | ORAL | Status: DC | PRN

## 2015-02-17 MED ORDER — PREDNISONE 10 MG (48) PO TBPK: ORAL_TABLET | Freq: Every day | ORAL | Status: DC

## 2015-02-17 MED ORDER — OXYCODONE-ACETAMINOPHEN 5-325 MG PO TABS: 1.0000 | ORAL_TABLET | ORAL | Status: DC | PRN

## 2015-02-17 NOTE — Patient Instructions (Signed)
Continue with icing, HEP. Followup with Dr Aline Brochure about how much time she should be using CPM. Currently using 2x daily for 10 minutes.

## 2015-02-17 NOTE — Therapy (Signed)
[facility] [address] [place], [place], [address] Phone: [phone]   Fax:  [phone]  Physical Therapy Treatment  Patient Details  Name: Colleen Lee MRN: [id number] Date of Birth: [birth year] Referring Provider:  Avon Gully, MD  Encounter Date: 02/17/2015      PT End of Session - 02/17/15 1131    Visit Number 4   Number of Visits 12   Date for PT Re-Evaluation 03/10/15   Authorization Type [employer]    Authorization Time Period 02/10/15 to 04/13/15   Authorization - Visit Number 4   Authorization - Number of Visits 10   PT Start Time 1100   PT Stop Time 1130   PT Time Calculation (min) 30 min   Activity Tolerance Patient limited by pain   Behavior During Therapy Anxious      Past Medical History  Diagnosis Date  . Hypertension   . Fibroids   . [name]     HCV RNA positive 09/2012  . GERD (gastroesophageal reflux disease)   . H. pylori infection 2014    treated with pylera  . Asthma   . PONV (postoperative nausea and vomiting)     pt thinks maybe once she had N&V  . Cirrhosis     Metavir score F4 on elastography 2015  . Fibromyalgia     Past Surgical History  Procedure Laterality Date  . Knee surgery      left knee  . Colonoscopy  01/18/2008    FTD:DUKGUR rectum.  Long redundant colon, a diminutive sigmoid polyp status post cold biopsy removed. Hyperplastic polyp. Repeat colonoscopy June 2014 due to family history of colon cancer  . Esophagogastroduodenoscopy  01/18/2008    RMR: Normal esophagus, normal  stomach  . Colonoscopy with esophagogastroduodenoscopy (egd) N/A 11/02/2012    [facility]:CWCBJSEG gastric mucosa of doubtful, +H.pylori. Incomplete colonoscopy due to patient unable to tolerate exam, proximal colon seen. Patient refused ACBE.  . Colonoscopy with propofol N/A 03/08/2013    Dr. Jena Gauss: colonic polyp-removed as scribed above. Internal Hemorrhoids. Pathology did not reveal any colonic tissue,  only mucus. SURVEILLANCE DUE Aug 2019  . Polypectomy N/A 03/08/2013    Procedure: POLYPECTOMY;  Surgeon: Corbin Ade, MD;  Location: [facility];  Service: Endoscopy;  Laterality: N/A;  . Multiple extractions with alveoloplasty N/A 10/15/2013    Procedure: MULTIPLE EXTRACTION WITH ALVEOLOPLASTY;  Surgeon: Georgia Lopes, DDS;  Location: [facility];  Service: Oral Surgery;  Laterality: N/A;  . Total knee arthroplasty Left 02/05/2015    Procedure: LEFT TOTAL KNEE ARTHROPLASTY;  Surgeon: Vickki Hearing, MD;  Location: [facility];  Service: Orthopedics;  Laterality: Left;    There were no vitals filed for this visit.  Visit Diagnosis:  Status post total left knee replacement  Left knee pain  Knee stiffness, left  Abnormality of gait  Impaired functional mobility and activity tolerance  Difficulty navigating stairs      Subjective Assessment - 02/17/15 1106    Subjective Went to ER last night with swelling, negative for PE. Still elevating and icing when pain hurts. Still using CPM 2x10' daily. Stiches out today.    Pertinent History L TKR was performed on Wednesday (July 6th); received PT in acute setting and states she is not getting HHPT right now.    How long can you stand comfortably? 10 minutes   How long can you walk comfortably? household AMb only right now with intermittent breaks    Currently  in Pain? Yes   Pain Score 10-Worst pain ever  swelling worse since yesterday   Pain Location Knee   Pain Orientation Left   Pain Descriptors / Indicators Aching                         [facility] Adult PT Treatment/Exercise - 02/17/15 1119    Knee/Hip Exercises: Stretches   Active Hamstring Stretch 3 reps;60 seconds   Active Hamstring Stretch Limitations 12 inch step   Knee/Hip Exercises: Standing   Heel Raises 15 reps;2 sets   Knee/Hip Exercises: Supine   Quad Sets 15 reps   Quad Sets Limitations 15x3sH   Short Arc [employer] 15 reps   Short Arc Quad Sets Limitations very  painful, requires Mod-maxA    Heel Slides 15 reps;AAROM   Heel Slides Limitations PT assist, very painful  0-67 degrees   Modalities   Modalities Cryotherapy  Ice pack- 5 minutes supine   Cryotherapy   Number Minutes Cryotherapy 5 Minutes   Cryotherapy Location Knee   Type of Cryotherapy Ice pack   Manual Therapy   Manual Therapy Joint mobilization   Manual therapy comments attempted latera patella mobs, pt in pan, palpable swelling, does nto tolerate.                 PT Education - 02/17/15 1130    Education provided Yes   Education Details Pt encouraged to attempt CPM c for greater amount of time and to assure better range tolerance for improved benefit.    Person(s) Educated Patient   Methods Explanation;Demonstration   Comprehension Verbalized understanding          PT Short Term Goals - 02/10/15 1711    PT SHORT TERM GOAL #1   Title Patient will demonstrate L knee ROM of 0-100 degrees    Time 2   Period Weeks   Status New   PT SHORT TERM GOAL #2   Title Patient to demonstrate L LE strength of at least 4-/5 and proximal muscle strength of at least 4-/5    Time 2   Period Weeks   Status New   PT SHORT TERM GOAL #3   Title Patient will demonstrate the ability to ambulate unlimited distances with SPC and pain no more than 2/10 L knee    Time 2   Period Weeks   Status New   PT SHORT TERM GOAL #4   Title Patient will be independent in correctly and consistently performing HEP to be updated PRN    Time 2   Period Weeks   Status New           PT Long Term Goals - 02/10/15 1718    PT LONG TERM GOAL #1   Title Patient will demonstrate L knee ROM of 0 degrees extension to at least 120 degrees flexion    Time 4   Period Weeks   Status New   PT LONG TERM GOAL #2   Title Patient will demonstrate bilateral LE strength of at least 4+/5 and proximal muscle strength at least 4/5    Time 4   Period Weeks   Status New   PT LONG TERM GOAL #3   Title Patient  will be able to ambulatue unlimited distances over even and uneven surfaces without assistive device and pain 0/10 L knee    Time 4   Period Weeks   Status New   PT LONG TERM GOAL #4  Title Patient will be able to ascend and descend full flight of stairs with no railings, no circumduction, and no unsteadiness throughout, pain L LE 0/10   Time 4   Period Weeks   Status New               Plan - 02/17/15 1132    Clinical Impression Statement Pt continues to demonstrate limited ROM in knee, today only flexing to around 67 degrees total. Mildly concerned about trip to ER over the weekend adn sudden increase in knee pain/effusion, but all findings were unremarkable. Shortened session today to pateitn tolerance. Continue to focus on mobility and isolated strength trainging. Holding off on functional exercise this session due to focual quads weakenss to avoid compensation development.    Pt will benefit from skilled therapeutic intervention in order to improve on the following deficits Abnormal gait;Decreased coordination;Difficulty walking;Decreased endurance;Decreased activity tolerance;Decreased skin integrity;Pain;Decreased balance;Hypomobility;Decreased scar mobility;Decreased mobility;Decreased strength;Postural dysfunction   Rehab Potential Good   PT Frequency 3x / week   PT Duration 4 weeks   PT Treatment/Interventions ADLs/Self Care Home Management;Gait training;Stair training;Functional mobility training;Therapeutic activities;Therapeutic exercise;Balance training;Neuromuscular re-education;Patient/family education;Manual techniques;Scar mobilization   PT Next Visit Plan Focus on isolated strengthening, avoid excessive functional, close chained strengthening to avoid compensation patterns until quads strength is improved.    PT Home Exercise Plan No changes   Consulted and Agree with Plan of Care Patient        Problem List Patient Active Problem List   Diagnosis Date Noted  .  Arthritis of knee, degenerative 02/05/2015  . History of Helicobacter pylori infection 10/30/2014  . Chronic [name] with cirrhosis 04/11/2014  . De Quervain's disease (radial styloid tenosynovitis) 12/25/2013  . Anorexia 11/21/2012  . FH: colon cancer 11/21/2012  . Early satiety 10/25/2012  . Bowel habit changes 10/25/2012  . Abdominal pain, epigastric 10/25/2012  . Abdominal bloating 10/25/2012  . Constipation 10/25/2012  . Abnormal weight loss 10/25/2012  . Chronic viral [name] 10/25/2012  . Radicular pain of left lower extremity 09/28/2012  . Back pain 09/28/2012  . Sciatica 08/10/2011  . S/P arthroscopy of left knee 08/10/2011  . Tibial plateau fracture 08/10/2011  . Pain in joint, lower leg 02/12/2011  . Stiffness of joint, not elsewhere classified, lower leg 02/12/2011  . 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 Meniscus, medial, derangement 12/29/2010  . CLOSED FRACTURE OF UPPER END OF TIBIA 08/12/2010    Reo Portela C 02/17/2015, 11:42 AM  11:44 AM  Rosamaria Lints, PT, DPT [place] License # [id number]       [facility] [address] [place], [place], [address] Phone: [phone]   Fax:  [phone]

## 2015-02-17 NOTE — ED Notes (Signed)
Patient was given a prepackage of Percocet quantity six and given instruction on use, patient verbally understands.

## 2015-02-17 NOTE — Progress Notes (Signed)
Patient ID: TANAISHA PITTMAN, female   DOB: [birth year], 59 y.o.   MRN: [id number]  Follow up visit  Chief Complaint  Patient presents with  . Follow-up    post op 1, left TKA, DOS 02/05/15    BP 155/87 mmHg  Ht 5\' 6"  (1.676 m)  Wt 168 lb (76.204 kg)  BMI 27.13 kg/m2  Encounter Diagnoses  Name Primary?  . Surgical aftercare, musculoskeletal system Yes  . Status post left knee replacement     The patient is 12 days postop from a routine left total knee she was in the ER last night complaining of calf and posterior thigh pain. She had an ultrasound this morning which was negative for DVT. Last night she had a dose of Lovenox for prophylaxis  She does have a history of sciatic nerve pain. She is probably experiencing some symptoms from that and we will add gabapentin 100 mg 3 times a day and a 12 day prednisone 10 mg dose pack. Her pain medicine is refilled. She will continue physical therapy and follow-up in 4 weeks  Her knee incision looks clean dry and intact there were no signs of infection her white count was normal last night. She does have some swelling in the lower part of her foot which is routine.   Encounter Diagnoses  Name Primary?  . Surgical aftercare, musculoskeletal system Yes  . Status post left knee replacement

## 2015-02-17 NOTE — Discharge Instructions (Signed)
You will need an ultrasound of your left leg tomorrow morning to rule out a blood clot. RN will give you the time. Hopefully this can be done before your appointment with Dr. Aline Brochure. Pain medication.

## 2015-02-19 ENCOUNTER — Ambulatory Visit (HOSPITAL_COMMUNITY): Payer: Commercial Managed Care - HMO | Admitting: Physical Therapy

## 2015-02-19 DIAGNOSIS — Z96652 Presence of left artificial knee joint: Secondary | ICD-10-CM | POA: Diagnosis not present

## 2015-02-19 DIAGNOSIS — M25562 Pain in left knee: Secondary | ICD-10-CM | POA: Diagnosis not present

## 2015-02-19 DIAGNOSIS — R269 Unspecified abnormalities of gait and mobility: Secondary | ICD-10-CM

## 2015-02-19 DIAGNOSIS — Z471 Aftercare following joint replacement surgery: Secondary | ICD-10-CM | POA: Diagnosis not present

## 2015-02-19 DIAGNOSIS — M25662 Stiffness of left knee, not elsewhere classified: Secondary | ICD-10-CM | POA: Diagnosis not present

## 2015-02-19 DIAGNOSIS — Z7409 Other reduced mobility: Secondary | ICD-10-CM | POA: Diagnosis not present

## 2015-02-19 NOTE — Therapy (Signed)
[facility] [place], [place], [address] Phone: [phone]   Fax:  [phone]  Physical Therapy Treatment  Patient Details  Name: Colleen Lee MRN: [id number] Date of Birth: 1956-03-01 Referring Provider:  Carole Civil, MD  Encounter Date: 02/19/2015      PT End of Session - 02/19/15 1222    Visit Number 5   Number of Visits 12   Date for PT Re-Evaluation 03/10/15   Authorization Type [employer]    Authorization Time Period 02/10/15 to 04/13/15   Authorization - Visit Number 5   Authorization - Number of Visits 10   PT Start Time 9528   PT Stop Time 4132   PT Time Calculation (min) 39 min      Past Medical History  Diagnosis Date  . Hypertension   . Fibroids   . [name]     HCV RNA positive 09/2012  . GERD (gastroesophageal reflux disease)   . H. pylori infection 2014    treated with pylera  . Asthma   . PONV (postoperative nausea and vomiting)     pt thinks maybe once she had N&V  . Cirrhosis     Metavir score F4 on elastography 2015  . Fibromyalgia     Past Surgical History  Procedure Laterality Date  . Knee surgery      left knee  . Colonoscopy  01/18/2008    GMW:NUUVOZ rectum.  Long redundant colon, a diminutive sigmoid polyp status post cold biopsy removed. Hyperplastic polyp. Repeat colonoscopy June 2014 due to family history of colon cancer  . Esophagogastroduodenoscopy  01/18/2008    RMR: Normal esophagus, normal  stomach  . Colonoscopy with esophagogastroduodenoscopy (egd) N/A 11/02/2012    DGU:YQIHKVQQ gastric mucosa of doubtful, +H.pylori. Incomplete colonoscopy due to patient unable to tolerate exam, proximal colon seen. Patient refused ACBE.  . Colonoscopy with propofol N/A 03/08/2013    Dr. Gala Romney: colonic polyp-removed as scribed above. Internal Hemorrhoids. Pathology did not reveal any colonic tissue, only mucus. SURVEILLANCE DUE Aug 2019  . Polypectomy N/A 03/08/2013   Procedure: POLYPECTOMY;  Surgeon: Daneil Dolin, MD;  Location: [facility];  Service: Endoscopy;  Laterality: N/A;  . Multiple extractions with alveoloplasty N/A 10/15/2013    Procedure: MULTIPLE EXTRACTION WITH ALVEOLOPLASTY;  Surgeon: Gae Bon, DDS;  Location: [facility];  Service: Oral Surgery;  Laterality: N/A;  . Total knee arthroplasty Left 02/05/2015    Procedure: LEFT TOTAL KNEE ARTHROPLASTY;  Surgeon: Carole Civil, MD;  Location: [facility];  Service: Orthopedics;  Laterality: Left;    There were no vitals filed for this visit.  Visit Diagnosis:  Status post total left knee replacement  Left knee pain  Knee stiffness, left  Abnormality of gait      Subjective Assessment - 02/19/15 1210    Subjective Pt states that her knee is feeling better.  Comes to the department using no assistive device.    Currently in Pain? Yes   Pain Score 4    Pain Location Knee   Pain Orientation Left   Pain Descriptors / Indicators Aching;Tightness              [facility] Adult PT Treatment/Exercise - 02/19/15 0001    Knee/Hip Exercises: Stretches   Active Hamstring Stretch Left;3 reps;30 seconds   Active Hamstring Stretch Limitations supine   Knee/Hip Exercises: Standing   Heel Raises 10 reps   Knee Flexion Left;10 reps   Terminal Knee  Extension Limitations x 10   Forward Step Up Left;10 reps;Hand Hold: 2;Step Height: 4"   Functional Squat 10 reps   Knee/Hip Exercises: Supine   Quad Sets 15 reps   Heel Slides 15 reps   Heel Slides Limitations Pt assist   Terminal Knee Extension 10 reps   Knee Extension --  7   Knee Extension Limitations PROM-gentle   Knee Flexion --  85   Knee Flexion Limitations PROM -gentle    Knee/Hip Exercises: Sidelying   Hip ABduction 10 reps   Manual Therapy   Manual Therapy Edema management   Manual therapy comments decongestion techniques to reduse edema                 PT Education - 02/19/15 1222    Education provided Yes   Education  Details heel toe gt    Person(s) Educated Patient   Methods Explanation;Demonstration   Comprehension Verbalized understanding;Returned demonstration          PT Short Term Goals - 02/10/15 1711    PT SHORT TERM GOAL #1   Title Patient will demonstrate L knee ROM of 0-100 degrees    Time 2   Period Weeks   Status New   PT SHORT TERM GOAL #2   Title Patient to demonstrate L LE strength of at least 4-/5 and proximal muscle strength of at least 4-/5    Time 2   Period Weeks   Status New   PT SHORT TERM GOAL #3   Title Patient will demonstrate the ability to ambulate unlimited distances with SPC and pain no more than 2/10 L knee    Time 2   Period Weeks   Status New   PT SHORT TERM GOAL #4   Title Patient will be independent in correctly and consistently performing HEP to be updated PRN    Time 2   Period Weeks   Status New           PT Long Term Goals - 02/10/15 1718    PT LONG TERM GOAL #1   Title Patient will demonstrate L knee ROM of 0 degrees extension to at least 120 degrees flexion    Time 4   Period Weeks   Status New   PT LONG TERM GOAL #2   Title Patient will demonstrate bilateral LE strength of at least 4+/5 and proximal muscle strength at least 4/5    Time 4   Period Weeks   Status New   PT LONG TERM GOAL #3   Title Patient will be able to ambulatue unlimited distances over even and uneven surfaces without assistive device and pain 0/10 L knee    Time 4   Period Weeks   Status New   PT LONG TERM GOAL #4   Title Patient will be able to ascend and descend full flight of stairs with no railings, no circumduction, and no unsteadiness throughout, pain L LE 0/10   Time 4   Period Weeks   Status New               Plan - 02/19/15 1223    Clinical Impression Statement Pt to departement without an assistive device.  Pt ambulating with decreased heelstrike,decreased knee flexion and decreased plantarflextion.  Therapist instructed pt in proper gt.   Added standing activites as well as manual for decreased edema.    PT Next Visit Plan begin SLS and assess how manual techniques did to decrease edma and pain  Problem List Patient Active Problem List   Diagnosis Date Noted  . Arthritis of knee, degenerative 02/05/2015  . History of Helicobacter pylori infection 10/30/2014  . Chronic [name] with cirrhosis 04/11/2014  . De Quervain's disease (radial styloid tenosynovitis) 12/25/2013  . Anorexia 11/21/2012  . FH: colon cancer 11/21/2012  . Early satiety 10/25/2012  . Bowel habit changes 10/25/2012  . Abdominal pain, epigastric 10/25/2012  . Abdominal bloating 10/25/2012  . Constipation 10/25/2012  . Abnormal weight loss 10/25/2012  . Chronic viral [name] 10/25/2012  . Radicular pain of left lower extremity 09/28/2012  . Back pain 09/28/2012  . Sciatica 08/10/2011  . S/P arthroscopy of left knee 08/10/2011  . Tibial plateau fracture 08/10/2011  . Pain in joint, lower leg 02/12/2011  . Stiffness of joint, not elsewhere classified, lower leg 02/12/2011  . 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 Meniscus, medial, derangement 12/29/2010  . CLOSED FRACTURE OF UPPER END OF TIBIA 08/12/2010   Rayetta Humphrey, PT CLT [phone] 02/19/2015, 12:36 PM  [facility] [address] [place], [place], [address] Phone: [phone]   Fax:  [phone]

## 2015-02-21 ENCOUNTER — Ambulatory Visit (HOSPITAL_COMMUNITY): Payer: Commercial Managed Care - HMO | Admitting: Physical Therapy

## 2015-02-21 DIAGNOSIS — R269 Unspecified abnormalities of gait and mobility: Secondary | ICD-10-CM

## 2015-02-21 DIAGNOSIS — Z7409 Other reduced mobility: Secondary | ICD-10-CM | POA: Diagnosis not present

## 2015-02-21 DIAGNOSIS — M25562 Pain in left knee: Secondary | ICD-10-CM

## 2015-02-21 DIAGNOSIS — R6889 Other general symptoms and signs: Secondary | ICD-10-CM

## 2015-02-21 DIAGNOSIS — M25662 Stiffness of left knee, not elsewhere classified: Secondary | ICD-10-CM | POA: Diagnosis not present

## 2015-02-21 DIAGNOSIS — Z789 Other specified health status: Secondary | ICD-10-CM

## 2015-02-21 DIAGNOSIS — Z96652 Presence of left artificial knee joint: Secondary | ICD-10-CM | POA: Diagnosis not present

## 2015-02-21 DIAGNOSIS — Z471 Aftercare following joint replacement surgery: Secondary | ICD-10-CM | POA: Diagnosis not present

## 2015-02-21 LAB — CULTURE, BLOOD (ROUTINE X 2)
Culture: NO GROWTH
Culture: NO GROWTH

## 2015-02-21 NOTE — Therapy (Signed)
[place], [place], [address] Phone: [phone]   Fax:  [phone]  Physical Therapy Treatment  Patient Details  Name: Colleen Lee MRN: [id number] Date of Birth: Jan 17, 1956 Referring Provider:  Rosita Fire, MD  Encounter Date: 02/21/2015      PT End of Session - 02/21/15 1427    Visit Number 7   Number of Visits 12   Date for PT Re-Evaluation 03/10/15   Authorization Type [employer]    Authorization - Visit Number 7   Authorization - Number of Visits 10   PT Start Time 1400   PT Stop Time 1444   PT Time Calculation (min) 44 min      Past Medical History  Diagnosis Date  . Hypertension   . Fibroids   . [name]     HCV RNA positive 09/2012  . GERD (gastroesophageal reflux disease)   . H. pylori infection 2014    treated with pylera  . Asthma   . PONV (postoperative nausea and vomiting)     pt thinks maybe once she had N&V  . Cirrhosis     Metavir score F4 on elastography 2015  . Fibromyalgia     Past Surgical History  Procedure Laterality Date  . Knee surgery      left knee  . Colonoscopy  01/18/2008    TMH:DQQIWL rectum.  Long redundant colon, a diminutive sigmoid polyp status post cold biopsy removed. Hyperplastic polyp. Repeat colonoscopy June 2014 due to family history of colon cancer  . Esophagogastroduodenoscopy  01/18/2008    RMR: Normal esophagus, normal  stomach  . Colonoscopy with esophagogastroduodenoscopy (egd) N/A 11/02/2012    NLG:XQJJHERD gastric mucosa of doubtful, +H.pylori. Incomplete colonoscopy due to patient unable to tolerate exam, proximal colon seen. Patient refused ACBE.  . Colonoscopy with propofol N/A 03/08/2013    Dr. Gala Romney: colonic polyp-removed as scribed above. Internal Hemorrhoids. Pathology did not reveal any colonic tissue, only mucus. SURVEILLANCE DUE Aug 2019  . Polypectomy N/A 03/08/2013    Procedure: POLYPECTOMY;  Surgeon: Daneil Dolin, MD;   Location: [facility];  Service: Endoscopy;  Laterality: N/A;  . Multiple extractions with alveoloplasty N/A 10/15/2013    Procedure: MULTIPLE EXTRACTION WITH ALVEOLOPLASTY;  Surgeon: Gae Bon, DDS;  Location: [facility];  Service: Oral Surgery;  Laterality: N/A;  . Total knee arthroplasty Left 02/05/2015    Procedure: LEFT TOTAL KNEE ARTHROPLASTY;  Surgeon: Carole Civil, MD;  Location: [facility];  Service: Orthopedics;  Laterality: Left;    There were no vitals filed for this visit.  Visit Diagnosis:  Status post total left knee replacement  Left knee pain  Knee stiffness, left  Abnormality of gait  Impaired functional mobility and activity tolerance  Difficulty navigating stairs      Subjective Assessment - 02/21/15 1155    Subjective Pt states manual decreased her swelling    Currently in Pain? Yes   Pain Score 6    Pain Location Knee   Pain Orientation Left                [facility] Adult PT Treatment/Exercise - 02/21/15 0001    Knee/Hip Exercises: Stretches   Active Hamstring Stretch Left;3 reps;30 seconds   Active Hamstring Stretch Limitations supine   Gastroc Stretch 3 reps;30 seconds   Gastroc Stretch Limitations slant board    Knee/Hip Exercises: Standing   Heel Raises 10 reps   Knee Flexion Left;10 reps  Forward Lunges Left;5 reps;10 reps   Forward Lunges Limitations on 14" step to increase ROM    Terminal Knee Extension Limitations x 10   Forward Step Up Left;10 reps;Hand Hold: 2;Step Height: 4"   Functional Squat 10 reps   SLS x3   Knee/Hip Exercises: Supine   Quad Sets 15 reps   Heel Slides 15 reps   Heel Slides Limitations Pt assist   Terminal Knee Extension 10 reps   Knee Extension --  5   Knee Extension Limitations PROM-gentle   Knee Flexion --  90   Knee Flexion Limitations PROM -gentle    Knee/Hip Exercises: Sidelying   Hip ABduction --   Manual Therapy   Manual Therapy Edema management   Manual therapy comments decongestion techniques to  reduce edema                   PT Short Term Goals - 02/10/15 1711    PT SHORT TERM GOAL #1   Title Patient will demonstrate L knee ROM of 0-100 degrees    Time 2   Period Weeks   Status New   PT SHORT TERM GOAL #2   Title Patient to demonstrate L LE strength of at least 4-/5 and proximal muscle strength of at least 4-/5    Time 2   Period Weeks   Status New   PT SHORT TERM GOAL #3   Title Patient will demonstrate the ability to ambulate unlimited distances with SPC and pain no more than 2/10 L knee    Time 2   Period Weeks   Status New   PT SHORT TERM GOAL #4   Title Patient will be independent in correctly and consistently performing HEP to be updated PRN    Time 2   Period Weeks   Status New           PT Long Term Goals - 02/10/15 1718    PT LONG TERM GOAL #1   Title Patient will demonstrate L knee ROM of 0 degrees extension to at least 120 degrees flexion    Time 4   Period Weeks   Status New   PT LONG TERM GOAL #2   Title Patient will demonstrate bilateral LE strength of at least 4+/5 and proximal muscle strength at least 4/5    Time 4   Period Weeks   Status New   PT LONG TERM GOAL #3   Title Patient will be able to ambulatue unlimited distances over even and uneven surfaces without assistive device and pain 0/10 L knee    Time 4   Period Weeks   Status New   PT LONG TERM GOAL #4   Title Patient will be able to ascend and descend full flight of stairs with no railings, no circumduction, and no unsteadiness throughout, pain L LE 0/10   Time 4   Period Weeks   Status New               Plan - 02/21/15 1427    Clinical Impression Statement Pt with improved ROM and activity tolerance.  Continues to be more functional at home.  Pt  does, however, has significant blance deficits. Pt continues to need skilled therapy to maximize pt functional ability.    PT Next Visit Plan begin quad stretching.;         Problem List Patient Active  Problem List   Diagnosis Date Noted  . Arthritis of knee, degenerative 02/05/2015  .  History of Helicobacter pylori infection 10/30/2014  . Chronic [name] with cirrhosis 04/11/2014  . De Quervain's disease (radial styloid tenosynovitis) 12/25/2013  . Anorexia 11/21/2012  . FH: colon cancer 11/21/2012  . Early satiety 10/25/2012  . Bowel habit changes 10/25/2012  . Abdominal pain, epigastric 10/25/2012  . Abdominal bloating 10/25/2012  . Constipation 10/25/2012  . Abnormal weight loss 10/25/2012  . Chronic viral [name] 10/25/2012  . Radicular pain of left lower extremity 09/28/2012  . Back pain 09/28/2012  . Sciatica 08/10/2011  . S/P arthroscopy of left knee 08/10/2011  . Tibial plateau fracture 08/10/2011  . Pain in joint, lower leg 02/12/2011  . Stiffness of joint, not elsewhere classified, lower leg 02/12/2011  . 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 Meniscus, medial, derangement 12/29/2010  . CLOSED FRACTURE OF UPPER END OF TIBIA 08/12/2010   Rayetta Humphrey, PT CLT [phone] 02/21/2015, 2:41 PM  [facility] [address] [place], [place], [address] Phone: [phone]   Fax:  [phone]

## 2015-02-24 ENCOUNTER — Telehealth: Payer: Self-pay | Admitting: Orthopedic Surgery

## 2015-02-24 ENCOUNTER — Ambulatory Visit (HOSPITAL_COMMUNITY): Payer: Commercial Managed Care - HMO | Admitting: Physical Therapy

## 2015-02-24 DIAGNOSIS — Z96652 Presence of left artificial knee joint: Secondary | ICD-10-CM | POA: Diagnosis not present

## 2015-02-24 DIAGNOSIS — Z7409 Other reduced mobility: Secondary | ICD-10-CM | POA: Diagnosis not present

## 2015-02-24 DIAGNOSIS — R269 Unspecified abnormalities of gait and mobility: Secondary | ICD-10-CM

## 2015-02-24 DIAGNOSIS — M25562 Pain in left knee: Secondary | ICD-10-CM | POA: Diagnosis not present

## 2015-02-24 DIAGNOSIS — M25662 Stiffness of left knee, not elsewhere classified: Secondary | ICD-10-CM | POA: Diagnosis not present

## 2015-02-24 DIAGNOSIS — Z471 Aftercare following joint replacement surgery: Secondary | ICD-10-CM | POA: Diagnosis not present

## 2015-02-24 NOTE — Therapy (Signed)
[place], [place], [address] Phone: [phone]   Fax:  [phone]  Physical Therapy Treatment  Patient Details  Name: Colleen Lee MRN: [id number] Date of Birth: 1955/10/23 Referring Provider:  Rosita Fire, MD  Encounter Date: 02/24/2015      PT End of Session - 02/24/15 1351    Visit Number 8   Number of Visits 12   Date for PT Re-Evaluation 03/10/15   Authorization Type [employer]    Authorization Time Period 02/10/15 to 04/13/15   Authorization - Visit Number 8   Authorization - Number of Visits 10   PT Start Time 1093   PT Stop Time 1238   PT Time Calculation (min) 43 min   Activity Tolerance Patient tolerated treatment well      Past Medical History  Diagnosis Date  . Hypertension   . Fibroids   . [name]     HCV RNA positive 09/2012  . GERD (gastroesophageal reflux disease)   . H. pylori infection 2014    treated with pylera  . Asthma   . PONV (postoperative nausea and vomiting)     pt thinks maybe once she had N&V  . Cirrhosis     Metavir score F4 on elastography 2015  . Fibromyalgia     Past Surgical History  Procedure Laterality Date  . Knee surgery      left knee  . Colonoscopy  01/18/2008    ATF:TDDUKG rectum.  Long redundant colon, a diminutive sigmoid polyp status post cold biopsy removed. Hyperplastic polyp. Repeat colonoscopy June 2014 due to family history of colon cancer  . Esophagogastroduodenoscopy  01/18/2008    RMR: Normal esophagus, normal  stomach  . Colonoscopy with esophagogastroduodenoscopy (egd) N/A 11/02/2012    URK:YHCWCBJS gastric mucosa of doubtful, +H.pylori. Incomplete colonoscopy due to patient unable to tolerate exam, proximal colon seen. Patient refused ACBE.  . Colonoscopy with propofol N/A 03/08/2013    Dr. Gala Romney: colonic polyp-removed as scribed above. Internal Hemorrhoids. Pathology did not reveal any colonic tissue, only mucus. SURVEILLANCE  DUE Aug 2019  . Polypectomy N/A 03/08/2013    Procedure: POLYPECTOMY;  Surgeon: Daneil Dolin, MD;  Location: [facility];  Service: Endoscopy;  Laterality: N/A;  . Multiple extractions with alveoloplasty N/A 10/15/2013    Procedure: MULTIPLE EXTRACTION WITH ALVEOLOPLASTY;  Surgeon: Gae Bon, DDS;  Location: [facility];  Service: Oral Surgery;  Laterality: N/A;  . Total knee arthroplasty Left 02/05/2015    Procedure: LEFT TOTAL KNEE ARTHROPLASTY;  Surgeon: Carole Civil, MD;  Location: [facility];  Service: Orthopedics;  Laterality: Left;    There were no vitals filed for this visit.  Visit Diagnosis:  Status post total left knee replacement  Left knee pain  Knee stiffness, left  Abnormality of gait      Subjective Assessment - 02/24/15 1154    Subjective Pt states that she is not having much pain in her Lt leg today    Pertinent History L TKR was performed on Wednesday (July 6th);    Currently in Pain? No/denies                         [facility] Adult PT Treatment/Exercise - 02/24/15 0001    Knee/Hip Exercises: Stretches   Active Hamstring Stretch Left;3 reps;30 seconds   Active Hamstring Stretch Limitations supine   Quad Stretch 3 reps;30 seconds   Gastroc Stretch 3 reps;30 seconds  Gastroc Stretch Limitations slant board    Knee/Hip Exercises: Standing   Heel Raises 10 reps   Heel Raises Limitations combined with squats lifting 4" ball off 14" step    Knee Flexion --   Forward Lunges Left;5 reps;10 reps   Forward Lunges Limitations on 14" step to increase ROM    Terminal Knee Extension Limitations x 10   Forward Step Up Left;10 reps;Hand Hold: 2;Step Height: 4"   Functional Squat 10 reps   SLS x3   Knee/Hip Exercises: Supine   Quad Sets 15 reps   Heel Slides 15 reps   Heel Slides Limitations Pt assist   Terminal Knee Extension 10 reps   Knee Extension --  5   Knee Extension Limitations PROM-gentle   Knee Flexion --  90   Knee Flexion Limitations PROM  -gentle    Knee/Hip Exercises: Prone   Hamstring Curl 10 reps   Straight Leg Raises Limitations --  terminal exension x 10    Manual Therapy   Manual Therapy Edema management   Manual therapy comments decongestion techniques to reduce edema -5 minutes                    PT Short Term Goals - 02/24/15 1353    PT SHORT TERM GOAL #1   Title Patient will demonstrate L knee ROM of 0-100 degrees    Time 2   Period Weeks   Status On-going   PT SHORT TERM GOAL #2   Title Patient to demonstrate L LE strength of at least 4-/5 and proximal muscle strength of at least 4-/5    Time 2   Period Weeks   Status On-going   PT SHORT TERM GOAL #3   Title Patient will demonstrate the ability to ambulate unlimited distances with SPC and pain no more than 2/10 L knee    Time 2   Period Weeks   Status On-going   PT SHORT TERM GOAL #4   Title Patient will be independent in correctly and consistently performing HEP to be updated PRN    Time 2   Period Weeks   Status Achieved           PT Long Term Goals - 02/24/15 1354    PT LONG TERM GOAL #1   Title Patient will demonstrate L knee ROM of 0 degrees extension to at least 120 degrees flexion    Time 4   Period Weeks   Status On-going   PT LONG TERM GOAL #2   Title Patient will demonstrate bilateral LE strength of at least 4+/5 and proximal muscle strength at least 4/5    Time 4   Period Weeks   Status On-going   PT LONG TERM GOAL #3   Title Patient will be able to ambulatue unlimited distances over even and uneven surfaces without assistive device and pain 0/10 L knee    Time 4   Period Weeks   Status On-going   PT LONG TERM GOAL #4   Title Patient will be able to ascend and descend full flight of stairs with no railings, no circumduction, and no unsteadiness throughout, pain L LE 0/10   Time 4   Period Weeks   Status On-going               Plan - 02/24/15 1352    Clinical Impression Statement Pt continues to  improve with ROM, activity toloerance and pain although knee continues to be swollen.  Added prone exercises today with minimal difficulty    PT Next Visit Plan begin stair climbing         Problem List Patient Active Problem List   Diagnosis Date Noted  . Arthritis of knee, degenerative 02/05/2015  . History of Helicobacter pylori infection 10/30/2014  . Chronic [name] with cirrhosis 04/11/2014  . De Quervain's disease (radial styloid tenosynovitis) 12/25/2013  . Anorexia 11/21/2012  . FH: colon cancer 11/21/2012  . Early satiety 10/25/2012  . Bowel habit changes 10/25/2012  . Abdominal pain, epigastric 10/25/2012  . Abdominal bloating 10/25/2012  . Constipation 10/25/2012  . Abnormal weight loss 10/25/2012  . Chronic viral [name] 10/25/2012  . Radicular pain of left lower extremity 09/28/2012  . Back pain 09/28/2012  . Sciatica 08/10/2011  . S/P arthroscopy of left knee 08/10/2011  . Tibial plateau fracture 08/10/2011  . Pain in joint, lower leg 02/12/2011  . Stiffness of joint, not elsewhere classified, lower leg 02/12/2011  . 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 Meniscus, medial, derangement 12/29/2010  . CLOSED FRACTURE OF UPPER END OF TIBIA 08/12/2010    Rayetta Humphrey, PT CLT [phone] 02/24/2015, 1:55 PM  [facility] [address] [place], [place], [address] Phone: [phone]   Fax:  [phone]

## 2015-02-24 NOTE — Telephone Encounter (Signed)
Patient called to request refill on pain medication: oxyCODONE-acetaminophen (PERCOCET/ROXICET) 5-325 MG per tablet [[id number]] - ph# [phone]

## 2015-02-26 ENCOUNTER — Ambulatory Visit (HOSPITAL_COMMUNITY): Payer: Commercial Managed Care - HMO

## 2015-02-26 DIAGNOSIS — Z7409 Other reduced mobility: Secondary | ICD-10-CM

## 2015-02-26 DIAGNOSIS — R269 Unspecified abnormalities of gait and mobility: Secondary | ICD-10-CM | POA: Diagnosis not present

## 2015-02-26 DIAGNOSIS — M25562 Pain in left knee: Secondary | ICD-10-CM

## 2015-02-26 DIAGNOSIS — M25662 Stiffness of left knee, not elsewhere classified: Secondary | ICD-10-CM

## 2015-02-26 DIAGNOSIS — Z789 Other specified health status: Secondary | ICD-10-CM

## 2015-02-26 DIAGNOSIS — Z471 Aftercare following joint replacement surgery: Secondary | ICD-10-CM | POA: Diagnosis not present

## 2015-02-26 DIAGNOSIS — Z96652 Presence of left artificial knee joint: Secondary | ICD-10-CM

## 2015-02-26 DIAGNOSIS — R6889 Other general symptoms and signs: Secondary | ICD-10-CM

## 2015-02-26 NOTE — Therapy (Signed)
[facility] [place], [place], [address] Phone: [phone]   Fax:  [phone]  Physical Therapy Treatment  Patient Details  Name: Colleen Lee MRN: [id number] Date of Birth: 26-Apr-1956 Referring Provider:  Rosita Fire, MD  Encounter Date: 02/26/2015      PT End of Session - 02/26/15 1640    Visit Number 9   Number of Visits 12   Date for PT Re-Evaluation 03/10/15   Authorization Type [employer]    Authorization Time Period 02/10/15 to 04/13/15   Authorization - Visit Number 9   Authorization - Number of Visits 10   PT Start Time 0940   PT Stop Time 1650   PT Time Calculation (min) 47 min   Activity Tolerance Patient tolerated treatment well;Patient limited by pain   Behavior During Therapy [facility] for tasks assessed/performed      Past Medical History  Diagnosis Date  . Hypertension   . Fibroids   . [name]     HCV RNA positive 09/2012  . GERD (gastroesophageal reflux disease)   . H. pylori infection 2014    treated with pylera  . Asthma   . PONV (postoperative nausea and vomiting)     pt thinks maybe once she had N&V  . Cirrhosis     Metavir score F4 on elastography 2015  . Fibromyalgia     Past Surgical History  Procedure Laterality Date  . Knee surgery      left knee  . Colonoscopy  01/18/2008    HWK:GSUPJS rectum.  Long redundant colon, a diminutive sigmoid polyp status post cold biopsy removed. Hyperplastic polyp. Repeat colonoscopy June 2014 due to family history of colon cancer  . Esophagogastroduodenoscopy  01/18/2008    RMR: Normal esophagus, normal  stomach  . Colonoscopy with esophagogastroduodenoscopy (egd) N/A 11/02/2012    RPR:XYVOPFYT gastric mucosa of doubtful, +H.pylori. Incomplete colonoscopy due to patient unable to tolerate exam, proximal colon seen. Patient refused ACBE.  . Colonoscopy with propofol N/A 03/08/2013    Dr. Gala Romney: colonic polyp-removed as scribed above. Internal  Hemorrhoids. Pathology did not reveal any colonic tissue, only mucus. SURVEILLANCE DUE Aug 2019  . Polypectomy N/A 03/08/2013    Procedure: POLYPECTOMY;  Surgeon: Daneil Dolin, MD;  Location: [facility];  Service: Endoscopy;  Laterality: N/A;  . Multiple extractions with alveoloplasty N/A 10/15/2013    Procedure: MULTIPLE EXTRACTION WITH ALVEOLOPLASTY;  Surgeon: Gae Bon, DDS;  Location: [facility];  Service: Oral Surgery;  Laterality: N/A;  . Total knee arthroplasty Left 02/05/2015    Procedure: LEFT TOTAL KNEE ARTHROPLASTY;  Surgeon: Carole Civil, MD;  Location: [facility];  Service: Orthopedics;  Laterality: Left;    There were no vitals filed for this visit.  Visit Diagnosis:  Status post total left knee replacement  Left knee pain  Knee stiffness, left  Abnormality of gait  Impaired functional mobility and activity tolerance  Difficulty navigating stairs      Subjective Assessment - 02/26/15 1600    Subjective Pt stated she is out of pain meds today, pain scale 10/10   Currently in Pain? Yes   Pain Score 10-Worst pain ever   Pain Location Knee   Pain Orientation Left   Pain Descriptors / Indicators Tightness;Sore;Aching             [facility] Adult PT Treatment/Exercise - 02/26/15 0001    Exercises   Exercises Knee/Hip   Knee/Hip Exercises: Stretches   Active  Hamstring Stretch Left;3 reps;30 seconds   Active Hamstring Stretch Limitations 12in step   Quad Stretch 3 reps;30 seconds   Quad Stretch Limitations prone with rope   Knee: Self-Stretch to increase Flexion Left   Knee: Self-Stretch Limitations 10 knee drives on 8in step   Gastroc Stretch 3 reps;30 seconds   Gastroc Stretch Limitations slant board    Knee/Hip Exercises: Aerobic   Stationary Bike Rocking 8' seat 11   Knee/Hip Exercises: Supine   Quad Sets 15 reps   Short Arc Quad Sets 15 reps   Heel Slides 15 reps   Manual Therapy   Manual Therapy Soft tissue mobilization   Soft tissue mobilization soft  tissue massage to Lt gastroc, hamstrings and quad to reduce tightness; Retro massage with LE elevated              PT Short Term Goals - 02/26/15 1652    PT SHORT TERM GOAL #1   Title Patient will demonstrate L knee ROM of 0-100 degrees    Status On-going   PT SHORT TERM GOAL #2   Title Patient to demonstrate L LE strength of at least 4-/5 and proximal muscle strength of at least 4-/5    Status On-going   PT SHORT TERM GOAL #3   Title Patient will demonstrate the ability to ambulate unlimited distances with SPC and pain no more than 2/10 L knee    Status On-going   PT SHORT TERM GOAL #4   Title Patient will be independent in correctly and consistently performing HEP to be updated PRN    Status Achieved           PT Long Term Goals - 02/26/15 1652    PT LONG TERM GOAL #1   Title Patient will demonstrate L knee ROM of 0 degrees extension to at least 120 degrees flexion    PT LONG TERM GOAL #2   Title Patient will demonstrate bilateral LE strength of at least 4+/5 and proximal muscle strength at least 4/5    PT LONG TERM GOAL #3   Title Patient will be able to ambulatue unlimited distances over even and uneven surfaces without assistive device and pain 0/10 L knee    PT LONG TERM GOAL #4   Title Patient will be able to ascend and descend full flight of stairs with no railings, no circumduction, and no unsteadiness throughout, pain L LE 0/10               Plan - 02/26/15 1641    Clinical Impression Statement Pt entered dept with pain scale 10/10.  Session focus on stretches and exercises to improve ROM to reduce tightness.  Began stationary bike with rocking to improve ROM and reduce tightness.  AROM 6-96 degrees following ROM exercises.  Reduced functional strengthening exercises due to high pain scale.  Manual soft tissue techniques complete to reduce tightness and spasms Lt gastroc, hamstring and quadriceps, noted myofascial restrictions distal quadriceps especially  lateral aspect and overall edema reduced following manual. Pt reported pain reduced to 7/10 at end of session.  Pt encouraged to apply ice for pain and edema control.   PT Next Visit Plan GCode due next session.  Continue with current PT POC, Resume CKC per pain tolerance, begin stair training next session.          Problem List Patient Active Problem List   Diagnosis Date Noted  . Arthritis of knee, degenerative 02/05/2015  . History of Helicobacter pylori infection  10/30/2014  . Chronic [name] with cirrhosis 04/11/2014  . De Quervain's disease (radial styloid tenosynovitis) 12/25/2013  . Anorexia 11/21/2012  . FH: colon cancer 11/21/2012  . Early satiety 10/25/2012  . Bowel habit changes 10/25/2012  . Abdominal pain, epigastric 10/25/2012  . Abdominal bloating 10/25/2012  . Constipation 10/25/2012  . Abnormal weight loss 10/25/2012  . Chronic viral [name] 10/25/2012  . Radicular pain of left lower extremity 09/28/2012  . Back pain 09/28/2012  . Sciatica 08/10/2011  . S/P arthroscopy of left knee 08/10/2011  . Tibial plateau fracture 08/10/2011  . Pain in joint, lower leg 02/12/2011  . Stiffness of joint, not elsewhere classified, lower leg 02/12/2011  . 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 Meniscus, medial, derangement 12/29/2010  . CLOSED FRACTURE OF UPPER END OF TIBIA 08/12/2010   Ihor Austin, LPTA; CBIS [phone]  Aldona Lento 02/26/2015, 4:56 PM  [facility] [address] [place], [place], [address] Phone: [phone]   Fax:  [phone]

## 2015-02-28 ENCOUNTER — Encounter (HOSPITAL_COMMUNITY): Payer: Self-pay | Admitting: Emergency Medicine

## 2015-02-28 ENCOUNTER — Ambulatory Visit (HOSPITAL_COMMUNITY): Payer: Commercial Managed Care - HMO | Admitting: Physical Therapy

## 2015-02-28 ENCOUNTER — Emergency Department (HOSPITAL_COMMUNITY)
Admission: EM | Admit: 2015-02-28 | Discharge: 2015-02-28 | Disposition: A | Discharge status: Home / Self Care | Payer: Commercial Managed Care - HMO | Attending: Emergency Medicine | Admitting: Emergency Medicine

## 2015-02-28 ENCOUNTER — Emergency Department (HOSPITAL_COMMUNITY): Payer: Commercial Managed Care - HMO

## 2015-02-28 DIAGNOSIS — Z8619 Personal history of other infectious and parasitic diseases: Secondary | ICD-10-CM | POA: Insufficient documentation

## 2015-02-28 DIAGNOSIS — M5432 Sciatica, left side: Secondary | ICD-10-CM | POA: Diagnosis not present

## 2015-02-28 DIAGNOSIS — I10 Essential (primary) hypertension: Secondary | ICD-10-CM | POA: Insufficient documentation

## 2015-02-28 DIAGNOSIS — Z9889 Other specified postprocedural states: Secondary | ICD-10-CM | POA: Diagnosis not present

## 2015-02-28 DIAGNOSIS — Z7952 Long term (current) use of systemic steroids: Secondary | ICD-10-CM | POA: Insufficient documentation

## 2015-02-28 DIAGNOSIS — R2242 Localized swelling, mass and lump, left lower limb: Secondary | ICD-10-CM | POA: Insufficient documentation

## 2015-02-28 DIAGNOSIS — Z7982 Long term (current) use of aspirin: Secondary | ICD-10-CM | POA: Insufficient documentation

## 2015-02-28 DIAGNOSIS — G8918 Other acute postprocedural pain: Secondary | ICD-10-CM | POA: Diagnosis not present

## 2015-02-28 DIAGNOSIS — Z792 Long term (current) use of antibiotics: Secondary | ICD-10-CM | POA: Diagnosis not present

## 2015-02-28 DIAGNOSIS — M797 Fibromyalgia: Secondary | ICD-10-CM | POA: Diagnosis not present

## 2015-02-28 DIAGNOSIS — Z86018 Personal history of other benign neoplasm: Secondary | ICD-10-CM | POA: Insufficient documentation

## 2015-02-28 DIAGNOSIS — K219 Gastro-esophageal reflux disease without esophagitis: Secondary | ICD-10-CM | POA: Diagnosis not present

## 2015-02-28 DIAGNOSIS — G8929 Other chronic pain: Secondary | ICD-10-CM | POA: Diagnosis not present

## 2015-02-28 DIAGNOSIS — Z96652 Presence of left artificial knee joint: Secondary | ICD-10-CM | POA: Diagnosis not present

## 2015-02-28 DIAGNOSIS — M25562 Pain in left knee: Secondary | ICD-10-CM | POA: Diagnosis present

## 2015-02-28 DIAGNOSIS — J45909 Unspecified asthma, uncomplicated: Secondary | ICD-10-CM | POA: Diagnosis not present

## 2015-02-28 DIAGNOSIS — M79652 Pain in left thigh: Secondary | ICD-10-CM | POA: Diagnosis not present

## 2015-02-28 DIAGNOSIS — Z79899 Other long term (current) drug therapy: Secondary | ICD-10-CM | POA: Diagnosis not present

## 2015-02-28 HISTORY — DX: Sciatica, left side: M54.32

## 2015-02-28 HISTORY — DX: Dorsalgia, unspecified: M54.9

## 2015-02-28 HISTORY — DX: Other chronic pain: G89.29

## 2015-02-28 MED ORDER — OXYCODONE-ACETAMINOPHEN 5-325 MG PO TABS
2.0000 | ORAL_TABLET | Freq: Once | ORAL | Status: AC
  Administered 2015-02-28: 2 via ORAL
  Filled 2015-02-28: qty 2

## 2015-02-28 MED ORDER — OXYCODONE-ACETAMINOPHEN 5-325 MG PO TABS: 1.0000 | ORAL_TABLET | Freq: Four times a day (QID) | ORAL | Status: DC | PRN

## 2015-02-28 NOTE — Discharge Instructions (Signed)
Pain Relief Preoperatively and Postoperatively °Being a good patient does not mean being a silent one. If you have questions, problems, or concerns about the pain you may feel after surgery, let your caregiver know. Patients have the right to assessment and management of pain. The treatment of pain after surgery is important to speed up recovery and return to normal activities. Severe pain after surgery, and the fear or anxiety associated with that pain, may cause extreme discomfort that: °· Prevents sleep. °· Decreases the ability to breathe deeply and cough. This can cause pneumonia or other upper airway infections. °· Causes your heart to beat faster and your blood pressure to be higher. °· Increases the risk for constipation and bloating. °· Decreases the ability of wounds to heal. °· May result in depression, increased anxiety, and feelings of helplessness. °Relief of pain before surgery is also important because it will lessen the pain after surgery. Patients who receive both pain relief before and after surgery experience greater pain relief than those who only receive pain relief after surgery. Let your caregiver know if you are having uncontrolled pain. This is very important. Pain after surgery is more difficult to manage if it is permitted to become severe, so prompt and adequate treatment of acute pain is necessary. °PAIN CONTROL METHODS °Your caregivers follow policies and procedures about the management of patient pain. These guidelines should be explained to you before surgery. Plans for pain control after surgery must be mutually decided upon and instituted with your full understanding and agreement. Do not be afraid to ask questions regarding the care you are receiving. There are many different ways your caregivers will attempt to control your pain, including the following methods. °As needed pain control °· You may be given pain medicine either through your intravenous (IV) tube, or as a pill or  liquid you can swallow. You will need to let your caregiver know when you are having pain. Then, your caregiver will give you the pain medicine ordered for you. °· Your pain medicine may make you constipated. If constipation occurs, drink more liquids if you can. Your caregiver may have you take a mild laxative. °IV patient-controlled analgesia pump (PCA pump) °· You can get your pain medicine through the IV tube which goes into your vein. You are able to control the amount of pain medicine that you get. The pain medicine flows in through an IV tube and is controlled by a pump. This pump gives you a set amount of pain medicine when you push the button hooked up to it. Nobody should push this button but you or someone specifically assigned by you to do so. It is set up to keep you from accidentally giving yourself too much pain medicine. You will be able to start using your pain pump in the recovery room after your surgery. This method can be helpful for most types of surgery. °· If you are still having too much pain, tell your caregiver. Also, tell your caregiver if you are feeling too sleepy or nauseous. °Continuous epidural pain control °· A thin, soft tube (catheter) is put into your back. Pain medicine flows through the catheter to lessen pain in the part of your body where the surgery is done. Continuous epidural pain control may work best for you if you are having surgery on your chest, abdomen, hip area, or legs. The epidural catheter is usually put into your back just before surgery. The catheter is left in until you can eat and take medicine by mouth. In most cases,   this may take 2 to 3 days.  Giving pain medicine through the epidural catheter may help you heal faster because:  Your bowel gets back to normal faster.  You can get back to eating sooner.  You can be up and walking sooner. Medicine that numbs the area (local anesthetic)  You may receive an injection of pain medicine near where the  pain is (local infiltration).  You may receive an injection of pain medicine near the nerve that controls the sensation to a specific part of the body (peripheral nerve block).  Medicine may be put in the spine to block pain (spinal block). Opioids  Moderate to moderately severe acute pain after surgery may respond to opioids.Opioids are narcotic pain medicine. Opioids are often combined with non-narcotic medicines to improve pain relief, diminish the risk of side effects, and reduce the chance of addiction.  If you follow your caregiver's directions about taking opioids and you do not have a history of substance abuse, your risk of becoming addicted is exceptionally small.Opioids are given for short periods of time in careful doses to prevent addiction. Other methods of pain control include:  Steroids.  Physical therapy.  Heat and cold therapy.  Compression, such as wrapping an elastic bandage around the area of pain.  Massage. These various ways of controlling pain may be used together. Combining different methods of pain control is called multimodal analgesia. Using this approach has many benefits, including being able to eat, move around, and leave the hospital sooner. Document Released: 10/09/2002 Document Revised: 10/11/2011 Document Reviewed: 10/13/2010 [facility] Patient Information 2015 [place], [place]. This information is not intended to replace advice given to you by your health care provider. Make sure you discuss any questions you have with your health care provider.  You may use the medicine prescribed for pain.  Do not drive within 4 hours of taking this medicine as it will make you drowsy.

## 2015-02-28 NOTE — ED Notes (Signed)
PT c/o pain to left knee posterior into her calf x1 day and states total knee surgery on 02/05/15.

## 2015-02-28 NOTE — ED Provider Notes (Signed)
CSN: [id number]     Arrival date & time 02/28/15  1029 History   First MD Initiated Contact with Patient 02/28/15 1050     Chief Complaint  Patient presents with  . Knee Pain     (Consider location/radiation/quality/duration/timing/severity/associated sxs/prior Treatment) The history is provided by the patient.   Colleen Lee is a 59 y.o. female with left knee and posterior upper calf pain and swelling since her total knee surgery performed by Dr Aline Brochure on 02/05/15, but worsened over the past several days.  She last went to PT yesterday where she underwent a reduced regimen secondary to pain.  She has been less active than normal given her recent surgery but is ambulatory.  She has noticed mild edema in her left knee and lower extremity which is worsened over the past week.  She also endorses she ran out of her hydrocodone and cannot get it refilled until the 1st.  She states prior to her surgery she was taking this medicine bid and the prescription was not changed post surgery, and has needed more frequent dosing, hence has run out. She denies fevers, chills, any increased redness at the surgery site and is able to flex and extend the joint despite swelling. She also denies shortness of breath and chest pain.     Past Medical History  Diagnosis Date  . Hypertension   . Fibroids   . [name]     HCV RNA positive 09/2012  . GERD (gastroesophageal reflux disease)   . H. pylori infection 2014    treated with pylera  . Asthma   . PONV (postoperative nausea and vomiting)     pt thinks maybe once she had N&V  . Cirrhosis     Metavir score F4 on elastography 2015  . Fibromyalgia   . Chronic back pain   . Sciatica of left side    Past Surgical History  Procedure Laterality Date  . Knee surgery      left knee  . Colonoscopy  01/18/2008    KZS:WFUXNA rectum.  Long redundant colon, a diminutive sigmoid polyp status post cold biopsy removed. Hyperplastic polyp. Repeat colonoscopy  June 2014 due to family history of colon cancer  . Esophagogastroduodenoscopy  01/18/2008    RMR: Normal esophagus, normal  stomach  . Colonoscopy with esophagogastroduodenoscopy (egd) N/A 11/02/2012    TFT:DDUKGURK gastric mucosa of doubtful, +H.pylori. Incomplete colonoscopy due to patient unable to tolerate exam, proximal colon seen. Patient refused ACBE.  . Colonoscopy with propofol N/A 03/08/2013    Dr. Gala Romney: colonic polyp-removed as scribed above. Internal Hemorrhoids. Pathology did not reveal any colonic tissue, only mucus. SURVEILLANCE DUE Aug 2019  . Polypectomy N/A 03/08/2013    Procedure: POLYPECTOMY;  Surgeon: Daneil Dolin, MD;  Location: [facility];  Service: Endoscopy;  Laterality: N/A;  . Multiple extractions with alveoloplasty N/A 10/15/2013    Procedure: MULTIPLE EXTRACTION WITH ALVEOLOPLASTY;  Surgeon: Gae Bon, DDS;  Location: [facility];  Service: Oral Surgery;  Laterality: N/A;  . Total knee arthroplasty Left 02/05/2015    Procedure: LEFT TOTAL KNEE ARTHROPLASTY;  Surgeon: Carole Civil, MD;  Location: [facility];  Service: Orthopedics;  Laterality: Left;   Family History  Problem Relation Age of Onset  . Colon cancer Brother        . Asthma      FH  . Multiple myeloma Brother   . Liver cancer Sister   . Prostate cancer Brother   . Pancreatic cancer  Brother   . Cancer Other     niece, age 50, primary unknown   History  Substance Use Topics  . Smoking status: Current Every Day Smoker -- 1.00 packs/day for 40 years    Types: Cigarettes  . Smokeless tobacco: Not on file     Comment: Smokes one pack of cigarettes daily  . Alcohol Use: No     Comment: former, last Jan 2014   OB History    Gravida Para Term Preterm AB TAB SAB Ectopic Multiple Living            1     Review of Systems  Constitutional: Negative for fever.  Musculoskeletal: Positive for joint swelling and arthralgias. Negative for myalgias.  Neurological: Negative for weakness and numbness.       Allergies  Penicillins  Home Medications   Prior to Admission medications   Medication Sig Start Date End Date Taking? Authorizing Provider  amLODipine (NORVASC) 5 MG tablet Take 5 mg by mouth daily.   Yes Historical Provider, MD  aspirin EC 325 MG EC tablet Take 1 tablet (325 mg total) by mouth 2 (two) times daily. 02/08/15  Yes Carole Civil, MD  Cyanocobalamin (VITAMIN B 12 PO) Take 1 tablet by mouth daily.   Yes Historical Provider, MD  cyclobenzaprine (FLEXERIL) 10 MG tablet Take 10 mg by mouth daily. 12/30/14  Yes Historical Provider, MD  esomeprazole (NEXIUM) 40 MG capsule Take 1 capsule (40 mg total) by mouth every morning. 12/19/14  Yes Orvil Feil, NP  gabapentin (NEURONTIN) 100 MG capsule Take 1 capsule (100 mg total) by mouth 3 (three) times daily. 02/17/15  Yes Carole Civil, MD  ibuprofen (ADVIL,MOTRIN) 800 MG tablet Take 1 tablet (800 mg total) by mouth every 8 (eight) hours as needed. 04/05/14  Yes Carole Civil, MD  lisinopril (PRINIVIL,ZESTRIL) 20 MG tablet Take 20 mg by mouth daily.   Yes Historical Provider, MD  megestrol (MEGACE) 40 MG tablet Take 40 mg by mouth daily.   Yes Historical Provider, MD  nicotine (NICODERM CQ - DOSED IN MG/24 HOURS) 21 mg/24hr patch Place 21 mg onto the skin daily.    Yes Historical Provider, MD  potassium chloride SA (K-DUR,KLOR-CON) 20 MEQ tablet Take 20 mEq by mouth daily.   Yes Historical Provider, MD  predniSONE (DELTASONE) 10 MG tablet Take 10 mg by mouth daily with breakfast.   Yes Historical Provider, MD  rifabutin (MYCOBUTIN) 150 MG capsule Take 2 capsules (300 mg total) by mouth daily. 12/19/14  Yes Orvil Feil, NP  tiZANidine (ZANAFLEX) 2 MG tablet Take 1 tablet (2 mg total) by mouth every 8 (eight) hours as needed. 02/08/15  Yes Carole Civil, MD  HYDROcodone-acetaminophen (NORCO/VICODIN) 5-325 MG per tablet Take 1 tablet by mouth every 6 (six) hours as needed for moderate pain.  01/30/15   Historical Provider, MD   ondansetron (ZOFRAN) 8 MG tablet Take 1 tablet (8 mg total) by mouth every 8 (eight) hours as needed. Patient taking differently: Take 8 mg by mouth every 8 (eight) hours as needed for nausea or vomiting.  08/14/14   Ripley Fraise, MD  oxyCODONE-acetaminophen (PERCOCET/ROXICET) 5-325 MG per tablet Take 1 tablet by mouth every 6 (six) hours as needed. 02/28/15   Evalee Jefferson, PA-C  predniSONE (STERAPRED UNI-PAK 48 TAB) 10 MG (48) TBPK tablet Take by mouth daily. Use as directed Patient not taking: Reported on 02/28/2015 02/17/15   Carole Civil, MD   BP [phone]  mmHg  Pulse 67  Temp(Src) 99.3 F (37.4 C) (Oral)  Resp 20  Ht $R'5\' 6"'zG$  (1.676 m)  Wt 178 lb (80.74 kg)  BMI 28.74 kg/m2  SpO2 100% Physical Exam  Constitutional: She appears well-developed and well-nourished.  HENT:  Head: Atraumatic.  Neck: Normal range of motion.  Cardiovascular:  Pulses equal bilaterally  Musculoskeletal: She exhibits edema and tenderness.       Left knee: She exhibits decreased range of motion and swelling. She exhibits no ecchymosis and no erythema.  Left knee edema with a well healing surgical incision line without erythema or red streaking.  She does have increased swelling anterior left knee,  Also non pitting edema to mid tibia.  She has ttp left upper posterior calf.  No palpable cords. Small bruise left medial mid thigh.  Left ankle with slight nonpitting edema.  Negative Homan's.  Dorsalis pedal pulse intact.  Neurological: She is alert. She has normal strength. She displays normal reflexes. No sensory deficit.  Skin: Skin is warm and dry.  Psychiatric: She has a normal mood and affect.    ED Course  Procedures (including critical care time) Labs Review Labs Reviewed - No data to display  Imaging Review US Venous Img Lower Unilateral Left  02/28/2015   CLINICAL DATA:  59 year old female with a history of chronic calf and thigh pain. Left knee surgery  EXAM: LEFT LOWER EXTREMITY VENOUS DOPPLER  ULTRASOUND  TECHNIQUE: Gray-scale sonography with graded compression, as well as color Doppler and duplex ultrasound were performed to evaluate the lower extremity deep venous systems from the level of the common femoral vein and including the common femoral, femoral, profunda femoral, popliteal and calf veins including the posterior tibial, peroneal and gastrocnemius veins when visible. The superficial great saphenous vein was also interrogated. Spectral Doppler was utilized to evaluate flow at rest and with distal augmentation maneuvers in the common femoral, femoral and popliteal veins.  COMPARISON:  02/17/2015  FINDINGS: Contralateral Common Femoral Vein: Respiratory phasicity is normal and symmetric with the symptomatic side. No evidence of thrombus. Normal compressibility.  Common Femoral Vein: No evidence of thrombus. Normal compressibility, respiratory phasicity and response to augmentation.  Saphenofemoral Junction: No evidence of thrombus. Normal compressibility and flow on color Doppler imaging.  Profunda Femoral Vein: No evidence of thrombus. Normal compressibility and flow on color Doppler imaging.  Femoral Vein: No evidence of thrombus. Normal compressibility, respiratory phasicity and response to augmentation.  Popliteal Vein: No evidence of thrombus. Normal compressibility, respiratory phasicity and response to augmentation.  Calf Veins: No evidence of thrombus. Normal compressibility and flow on color Doppler imaging.  Superficial Great Saphenous Vein: No evidence of thrombus. Normal compressibility and flow on color Doppler imaging.  Other Findings:  None.  IMPRESSION: Sonographic survey of the left lower extremity negative for DVT.  Signed,  Dulcy Fanny. Earleen Newport, DO  [facility]  [facility]   Electronically Signed   By: Corrie Mckusick D.O.   On: 02/28/2015 13:01     EKG Interpretation None      MDM   Final diagnoses:  Post-operative pain     Patients labs and/or radiological studies were reviewed and considered during the medical decision making and disposition process.   Results were also discussed with patient.  Suspect patients increases sx primarily due to need for increased pain medicine regimen.  She was given a prescription of oxycodone for improved pain relief.  Advised f/u with her pcp or surgeon as needed  for ongoing post surgical care.  The patient appears reasonably screened and/or stabilized for discharge and I doubt any other medical condition or other [facility] requiring further screening, evaluation, or treatment in the ED at this time prior to discharge.      Evalee Jefferson, PA-C 03/01/15 2134  Francine Graven, DO 03/03/15 [phone]

## 2015-03-03 ENCOUNTER — Ambulatory Visit (HOSPITAL_COMMUNITY): Payer: Commercial Managed Care - HMO | Admitting: Physical Therapy

## 2015-03-03 ENCOUNTER — Other Ambulatory Visit: Payer: Self-pay | Admitting: *Deleted

## 2015-03-03 MED ORDER — OXYCODONE-ACETAMINOPHEN 5-325 MG PO TABS: 1.0000 | ORAL_TABLET | ORAL | Status: DC | PRN

## 2015-03-04 ENCOUNTER — Other Ambulatory Visit: Payer: Self-pay | Admitting: *Deleted

## 2015-03-04 MED ORDER — HYDROCODONE-ACETAMINOPHEN 10-325 MG PO TABS: 1.0000 | ORAL_TABLET | ORAL | Status: DC | PRN

## 2015-03-04 NOTE — Telephone Encounter (Signed)
Prescription available, patient aware  

## 2015-03-05 ENCOUNTER — Ambulatory Visit (HOSPITAL_COMMUNITY): Payer: Commercial Managed Care - HMO | Attending: Internal Medicine | Admitting: Physical Therapy

## 2015-03-05 DIAGNOSIS — R609 Edema, unspecified: Secondary | ICD-10-CM | POA: Insufficient documentation

## 2015-03-05 DIAGNOSIS — R269 Unspecified abnormalities of gait and mobility: Secondary | ICD-10-CM | POA: Diagnosis not present

## 2015-03-05 DIAGNOSIS — Z7409 Other reduced mobility: Secondary | ICD-10-CM | POA: Insufficient documentation

## 2015-03-05 DIAGNOSIS — R6889 Other general symptoms and signs: Secondary | ICD-10-CM

## 2015-03-05 DIAGNOSIS — Z96652 Presence of left artificial knee joint: Secondary | ICD-10-CM | POA: Diagnosis not present

## 2015-03-05 DIAGNOSIS — Z658 Other specified problems related to psychosocial circumstances: Secondary | ICD-10-CM | POA: Diagnosis not present

## 2015-03-05 DIAGNOSIS — Z789 Other specified health status: Secondary | ICD-10-CM

## 2015-03-05 DIAGNOSIS — M25562 Pain in left knee: Secondary | ICD-10-CM | POA: Insufficient documentation

## 2015-03-05 DIAGNOSIS — M25662 Stiffness of left knee, not elsewhere classified: Secondary | ICD-10-CM | POA: Insufficient documentation

## 2015-03-05 NOTE — Therapy (Signed)
[place], [place], [address] Phone: [phone]   Fax:  [phone]  Physical Therapy Treatment (G-Codes)  Patient Details  Name: Colleen Lee MRN: [id number] Date of Birth: [birth year] Referring Provider:  Rosita Fire, MD  Encounter Date: 03/05/2015      PT End of Session - 03/05/15 1423    Visit Number 10   Number of Visits 12   Date for PT Re-Evaluation 03/10/15   Authorization Type [employer]  (G-codes done 10th session)   Authorization Time Period 02/10/15 to 04/13/15   Authorization - Visit Number 10   Authorization - Number of Visits 20   PT Start Time 1324   PT Stop Time 1428   PT Time Calculation (min) 34 min   Activity Tolerance Patient tolerated treatment well   Behavior During Therapy [facility] for tasks assessed/performed      Past Medical History  Diagnosis Date  . Hypertension   . Fibroids   . [name]     HCV RNA positive 09/2012  . GERD (gastroesophageal reflux disease)   . H. pylori infection 2014    treated with pylera  . Asthma   . PONV (postoperative nausea and vomiting)     pt thinks maybe once she had N&V  . Cirrhosis     Metavir score F4 on elastography 2015  . Fibromyalgia   . Chronic back pain   . Sciatica of left side     Past Surgical History  Procedure Laterality Date  . Knee surgery      left knee  . Colonoscopy  01/18/2008    MWN:[id number] rectum.  Long redundant colon, a diminutive sigmoid polyp status post cold biopsy removed. Hyperplastic polyp. Repeat colonoscopy June 2014 due to family history of colon cancer  . Esophagogastroduodenoscopy  01/18/2008    RMR: Normal esophagus, normal  stomach  . Colonoscopy with esophagogastroduodenoscopy (egd) N/A 11/02/2012    GUY:[id number] gastric mucosa of doubtful, +H.pylori. Incomplete colonoscopy due to patient unable to tolerate exam, proximal colon seen. Patient refused ACBE.  . Colonoscopy with propofol N/A  03/08/2013    Dr. Gala Romney: colonic polyp-removed as scribed above. Internal Hemorrhoids. Pathology did not reveal any colonic tissue, only mucus. SURVEILLANCE DUE Aug 2019  . Polypectomy N/A 03/08/2013    Procedure: POLYPECTOMY;  Surgeon: Daneil Dolin, MD;  Location: [facility];  Service: Endoscopy;  Laterality: N/A;  . Multiple extractions with alveoloplasty N/A 10/15/2013    Procedure: MULTIPLE EXTRACTION WITH ALVEOLOPLASTY;  Surgeon: Gae Bon, DDS;  Location: [place];  Service: Oral Surgery;  Laterality: N/A;  . Total knee arthroplasty Left 02/05/2015    Procedure: LEFT TOTAL KNEE ARTHROPLASTY;  Surgeon: Carole Civil, MD;  Location: [facility];  Service: Orthopedics;  Laterality: Left;    There were no vitals filed for this visit.  Visit Diagnosis:  Status post total left knee replacement  Left knee pain  Knee stiffness, left  Abnormality of gait  Impaired functional mobility and activity tolerance  Difficulty navigating stairs      Subjective Assessment - 03/05/15 1354    Subjective Patient reports she is doing well, reports she feels like she is doing much better    Pertinent History L TKR was performed on Wednesday (July 6th);    Currently in Pain? Yes   Pain Score 4    Pain Location Knee   Pain Orientation Left  [facility] PT Treatment/Exercise - 03/05/15 0001    Ambulation/Gait   Stairs Yes   Stairs Assistance 6: Modified independent (Device/Increase time)   Stair Management Technique Two rails;Forwards;Alternating pattern   Number of Stairs --  4 seven inch, 7 four inch    Gait Comments minimal circumduction but signfiicant weakness and unsteadiness noted in affected knee on stairs; pain on 7 inch steps; cues for form    Knee/Hip Exercises: Stretches   Active Hamstring Stretch Both;1 rep;30 seconds   Active Hamstring Stretch Limitations 12 inch step    Oncologist Limitations --   Knee: Self-Stretch to  increase Flexion Left   Knee: Self-Stretch Limitations 10 reps, 10 second holds    Press photographer Both;3 reps;30 seconds   Gastroc Stretch Limitations slant board    Knee/Hip Exercises: Aerobic   Stationary Bike Rocking 8' seat 11   Knee/Hip Exercises: Standing   Heel Raises 15 reps   Terminal Knee Extension Limitations x15   Forward Step Up Both;1 set;10 reps   Forward Step Up Limitations 4 inch box    Step Down Both;1 set;10 reps   Step Down Limitations 2 inch step    Rocker Board Limitations x20AP ans x20 lateral U HHA                 PT Education - 03/05/15 1422    Education provided Yes   Education Details educated about stair technique and form; educated that while ibuprofen does help to reduce inflammation and pain, it does not directly reduce edema    Person(s) Educated Patient   Methods Explanation   Comprehension Verbalized understanding          PT Short Term Goals - 02/26/15 1652    PT SHORT TERM GOAL #1   Title Patient will demonstrate L knee ROM of 0-100 degrees    Status On-going   PT SHORT TERM GOAL #2   Title Patient to demonstrate L LE strength of at least 4-/5 and proximal muscle strength of at least 4-/5    Status On-going   PT SHORT TERM GOAL #3   Title Patient will demonstrate the ability to ambulate unlimited distances with SPC and pain no more than 2/10 L knee    Status On-going   PT SHORT TERM GOAL #4   Title Patient will be independent in correctly and consistently performing HEP to be updated PRN    Status Achieved           PT Long Term Goals - 02/26/15 1652    PT LONG TERM GOAL #1   Title Patient will demonstrate L knee ROM of 0 degrees extension to at least 120 degrees flexion    PT LONG TERM GOAL #2   Title Patient will demonstrate bilateral LE strength of at least 4+/5 and proximal muscle strength at least 4/5    PT LONG TERM GOAL #3   Title Patient will be able to ambulatue unlimited distances over even and uneven surfaces  without assistive device and pain 0/10 L knee    PT LONG TERM GOAL #4   Title Patient will be able to ascend and descend full flight of stairs with no railings, no circumduction, and no unsteadiness throughout, pain L LE 0/10               Plan - 03/05/15 1423    Clinical Impression Statement Introduced stair training and eccentric strength today; patient able to tolerate all  exercises today with less pain but did require cues for form. Continued rocking exercise on stationary bike. G-code done today.    Pt will benefit from skilled therapeutic intervention in order to improve on the following deficits Abnormal gait;Decreased coordination;Difficulty walking;Decreased endurance;Decreased activity tolerance;Decreased skin integrity;Pain;Decreased balance;Hypomobility;Decreased scar mobility;Decreased mobility;Decreased strength;Postural dysfunction   Rehab Potential Good   PT Frequency 3x / week   PT Duration 4 weeks   PT Treatment/Interventions ADLs/Self Care Home Management;Gait training;Stair training;Functional mobility training;Therapeutic activities;Therapeutic exercise;Balance training;Neuromuscular re-education;Patient/family education;Manual techniques;Scar mobilization   PT Next Visit Plan Continue with current PT POC, Resume CKC per pain tolerance, continue stair training next session.     PT Home Exercise Plan No changes   Consulted and Agree with Plan of Care Patient          G-Codes - 03/21/15 1424    Functional Assessment Tool Used Based on skilled clinical assessment of ROM, strength, pain, gait, functional mobility, stairs   Functional Limitation Mobility: Walking and moving around   Mobility: Walking and Moving Around Current Status ([id number]) At least 40 percent but less than 60 percent impaired, limited or restricted   Mobility: Walking and Moving Around Goal Status [phone]) At least 20 percent but less than 40 percent impaired, limited or restricted      Problem  List Patient Active Problem List   Diagnosis Date Noted  . Arthritis of knee, degenerative 02/05/2015  . History of Helicobacter pylori infection 10/30/2014  . Chronic [name] with cirrhosis 04/11/2014  . De Quervain's disease (radial styloid tenosynovitis) 12/25/2013  . Anorexia 11/21/2012  . FH: colon cancer 11/21/2012  . Early satiety 10/25/2012  . Bowel habit changes 10/25/2012  . Abdominal pain, epigastric 10/25/2012  . Abdominal bloating 10/25/2012  . Constipation 10/25/2012  . Abnormal weight loss 10/25/2012  . Chronic viral [name] 10/25/2012  . Radicular pain of left lower extremity 09/28/2012  . Back pain 09/28/2012  . Sciatica 08/10/2011  . S/P arthroscopy of left knee 08/10/2011  . Tibial plateau fracture 08/10/2011  . Pain in joint, lower leg 02/12/2011  . Stiffness of joint, not elsewhere classified, lower leg 02/12/2011  . 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 Meniscus, medial, derangement 12/29/2010  . CLOSED FRACTURE OF UPPER END OF TIBIA 08/12/2010   Physical Therapy Progress Note  Dates of Reporting Period: 02/10/15 to 03/21/2015  Objective Reports of Subjective Statement: patient reports she is still experiencing edema and stiffness in her knee, but does feel like she is improving overall   Objective Measurements: not taken today   Goal Update: not taken today   Plan: not taken today   Reason Skilled Services are Required: knee stiffness, edema management, functional strengthening, gait and stair training, balance training    Deniece Ree PT, DPT [place], [place], [address] Phone: [phone]   Fax:  [phone]

## 2015-03-07 ENCOUNTER — Ambulatory Visit (HOSPITAL_COMMUNITY): Payer: Commercial Managed Care - HMO | Admitting: Physical Therapy

## 2015-03-07 DIAGNOSIS — Z96652 Presence of left artificial knee joint: Secondary | ICD-10-CM | POA: Diagnosis not present

## 2015-03-07 DIAGNOSIS — Z7409 Other reduced mobility: Secondary | ICD-10-CM

## 2015-03-07 DIAGNOSIS — M25562 Pain in left knee: Secondary | ICD-10-CM | POA: Diagnosis not present

## 2015-03-07 DIAGNOSIS — R269 Unspecified abnormalities of gait and mobility: Secondary | ICD-10-CM | POA: Diagnosis not present

## 2015-03-07 DIAGNOSIS — R6889 Other general symptoms and signs: Secondary | ICD-10-CM

## 2015-03-07 DIAGNOSIS — R609 Edema, unspecified: Secondary | ICD-10-CM | POA: Diagnosis not present

## 2015-03-07 DIAGNOSIS — M25662 Stiffness of left knee, not elsewhere classified: Secondary | ICD-10-CM | POA: Diagnosis not present

## 2015-03-07 DIAGNOSIS — Z789 Other specified health status: Secondary | ICD-10-CM

## 2015-03-07 DIAGNOSIS — Z658 Other specified problems related to psychosocial circumstances: Secondary | ICD-10-CM | POA: Diagnosis not present

## 2015-03-07 NOTE — Therapy (Signed)
[place], [place], [address] Phone: [phone]   Fax:  [phone]  Physical Therapy Treatment  Patient Details  Name: Colleen Lee MRN: [id number] Date of Birth: 08-07-1955 Referring Provider:  Rosita Fire, MD  Encounter Date: 03/07/2015      PT End of Session - 03/07/15 1431    Visit Number 11   Number of Visits 23   Date for PT Re-Evaluation 04/06/15   Authorization Type [employer]  (G-codes done 10th session)   Authorization Time Period 02/10/15 to 04/13/15   Authorization - Visit Number 11   Authorization - Number of Visits 20   PT Start Time 5597   PT Stop Time 1435   PT Time Calculation (min) 47 min   Behavior During Therapy [facility] for tasks assessed/performed      Past Medical History  Diagnosis Date  . Hypertension   . Fibroids   . [name]     HCV RNA positive 09/2012  . GERD (gastroesophageal reflux disease)   . H. pylori infection 2014    treated with pylera  . Asthma   . PONV (postoperative nausea and vomiting)     pt thinks maybe once she had N&V  . Cirrhosis     Metavir score F4 on elastography 2015  . Fibromyalgia   . Chronic back pain   . Sciatica of left side     Past Surgical History  Procedure Laterality Date  . Knee surgery      left knee  . Colonoscopy  01/18/2008    CBU:[id number] rectum.  Long redundant colon, a diminutive sigmoid polyp status post cold biopsy removed. Hyperplastic polyp. Repeat colonoscopy June 2014 due to family history of colon cancer  . Esophagogastroduodenoscopy  01/18/2008    RMR: Normal esophagus, normal  stomach  . Colonoscopy with esophagogastroduodenoscopy (egd) N/A 11/02/2012    IWO:EHOZYYQM gastric mucosa of doubtful, +H.pylori. Incomplete colonoscopy due to patient unable to tolerate exam, proximal colon seen. Patient refused ACBE.  . Colonoscopy with propofol N/A 03/08/2013    Dr. Gala Romney: colonic polyp-removed as scribed above. Internal  Hemorrhoids. Pathology did not reveal any colonic tissue, only mucus. SURVEILLANCE DUE Aug 2019  . Polypectomy N/A 03/08/2013    Procedure: POLYPECTOMY;  Surgeon: Daneil Dolin, MD;  Location: [facility];  Service: Endoscopy;  Laterality: N/A;  . Multiple extractions with alveoloplasty N/A 10/15/2013    Procedure: MULTIPLE EXTRACTION WITH ALVEOLOPLASTY;  Surgeon: Gae Bon, DDS;  Location: [place];  Service: Oral Surgery;  Laterality: N/A;  . Total knee arthroplasty Left 02/05/2015    Procedure: LEFT TOTAL KNEE ARTHROPLASTY;  Surgeon: Carole Civil, MD;  Location: [facility];  Service: Orthopedics;  Laterality: Left;    There were no vitals filed for this visit.  Visit Diagnosis:  Status post total left knee replacement  Left knee pain  Knee stiffness, left  Abnormality of gait  Impaired functional mobility and activity tolerance  Difficulty navigating stairs      Subjective Assessment - 03/07/15 1351    Subjective Pt states she is able to do everything she wants to around the house it just takes her longer.  Pt main concern is her swelling    Pertinent History L TKR was performed on Wednesday (July 6th);    How long can you stand comfortably? 10 minutes   How long can you walk comfortably? household AMb only right now with intermittent breaks    Currently in  Pain? Yes   Pain Score 5    Pain Location Knee   Pain Orientation Left   Pain Descriptors / Indicators Aching            [facility] PT Assessment - 03/07/15 0001    Assessment   Medical Diagnosis L TKR    Onset Date/Surgical Date 02/05/15   Next MD Visit Not sure of date but is scheduled    Precautions   Precautions None   Restrictions   Weight Bearing Restrictions No   Prior Function   Level of Independence Independent;Independent with basic ADLs;Independent with gait;Independent with transfers   Vocation On disability   Leisure plant flowers, yard scaping, etc    Posture/Postural Control   Posture Comments  increased lumbar lordosis, forward head, IR shoulders B, flexed at hips, reduced weight bearing L LE with no heel touch L LE, knee flexion in stance L LE    AROM   Right Hip External Rotation  --  full range    Right Hip Internal Rotation  38   Left Hip External Rotation  23   Left Hip Internal Rotation  20   Left Knee Extension 8  was 10   Left Knee Flexion 92  was 43   Strength   Right Hip Flexion 5/5  was 4+/5    Right Hip ABduction 5/5   Left Hip Flexion 5/5  was 2+   Left Hip Extension 5/5   Left Hip ABduction 5/5  was 3-/5   Right Knee Flexion 5/5   Right Knee Extension 5/5  was 4/5    Left Knee Flexion 5/5  was 2+   Left Knee Extension 5/5  was 3-/5    Right Ankle Dorsiflexion 5/5   Left Ankle Dorsiflexion 5/5  was 4/5   Ambulation/Gait   Gait Comments flexed at hips, reduced TKE L, reduced weight bearking L, antalgic gait, reduced rotation hips and trunk, reduced stance L/step R                      [facility] Adult PT Treatment/Exercise - 03/07/15 1357    Knee/Hip Exercises: Stretches   Active Hamstring Stretch Left;3 reps;30 seconds   Active Hamstring Stretch Limitations 15" bpc    Gastroc Stretch Both;3 reps;30 seconds   Gastroc Stretch Limitations slant board    Knee/Hip Exercises: Aerobic   Stationary Bike full rotation x 10'   Knee/Hip Exercises: Standing   Step Down Left;10 reps;Step Height: 4"   Stairs x 2  RT   SLS with Vectors 10" x 3    Knee/Hip Exercises: Supine   Quad Sets 10 reps   Heel Slides 10 reps   Knee/Hip Exercises: Prone   Straight Leg Raises --  terminal extesion x 10    Manual Therapy   Manual Therapy Edema management;Manual Lymphatic Drainage (MLD);Passive ROM   Manual Lymphatic Drainage (MLD) inguinal-axillary anastomosis followed by Lt LE    Passive ROM for flexion and extension                   PT Short Term Goals - 02/26/15 1652    PT SHORT TERM GOAL #1   Title Patient will demonstrate L knee ROM of  0-100 degrees    Status On-going   PT SHORT TERM GOAL #2   Title Patient to demonstrate L LE strength of at least 4-/5 and proximal muscle strength of at least 4-/5    Status Met    PT  SHORT TERM GOAL #3   Title Patient will demonstrate the ability to ambulate unlimited distances with SPC and pain no more than 2/10 L knee    Status Met    PT SHORT TERM GOAL #4   Title Patient will be independent in correctly and consistently performing HEP to be updated PRN    Status Achieved           PT Long Term Goals - 02/26/15 1652    PT LONG TERM GOAL #1   Title Patient will demonstrate L knee ROM of 0 degrees extension to at least 120 degrees flexion    PT LONG TERM GOAL #2   Title Patient will demonstrate bilateral LE strength of at least 4+/5 and proximal muscle strength at least 4/5 - met    PT LONG TERM GOAL #3   Title Patient will be able to ambulatue unlimited distances over even and uneven surfaces without assistive device and pain 0/10 L knee    PT LONG TERM GOAL #4   Title Patient will be able to ascend and descend full flight of stairs with no railings, no circumduction, and no unsteadiness throughout, pain L LE 0/10               Plan - 03/07/15 1433    Clinical Impression Statement Pt reassessed with good strength noted.  Pt main concern at this time is to decrease edema so that ROM and pain will improve.  Treatment sessions will concentrate on manual to decrase swelling and ROM.    Pt will benefit from skilled therapeutic intervention in order to improve on the following deficits Abnormal gait;Decreased coordination;Difficulty walking;Decreased endurance;Decreased activity tolerance;Decreased skin integrity;Pain;Decreased balance;Hypomobility;Decreased scar mobility;Decreased mobility;Decreased strength;Postural dysfunction   PT Frequency 3x / week   PT Duration 8 weeks  for an additional 4 weeks.    PT Treatment/Interventions ADLs/Self Care Home Management;Gait  training;Stair training;Therapeutic activities;Therapeutic exercise;Balance training;Patient/family education;Manual techniques   PT Next Visit Plan focus on ROM with standing knee flexion, lunging on 6' step, quad and hamstretches, PROM, prone terminal flexion. Continue with vector stances for balance and manual to decrease edema.    PT Home Exercise Plan No changes   Consulted and Agree with Plan of Care Patient        Problem List Patient Active Problem List   Diagnosis Date Noted  . Arthritis of knee, degenerative 02/05/2015  . History of Helicobacter pylori infection 10/30/2014  . Chronic [name] with cirrhosis 04/11/2014  . De Quervain's disease (radial styloid tenosynovitis) 12/25/2013  . Anorexia 11/21/2012  . FH: colon cancer 11/21/2012  . Early satiety 10/25/2012  . Bowel habit changes 10/25/2012  . Abdominal pain, epigastric 10/25/2012  . Abdominal bloating 10/25/2012  . Constipation 10/25/2012  . Abnormal weight loss 10/25/2012  . Chronic viral [name] 10/25/2012  . Radicular pain of left lower extremity 09/28/2012  . Back pain 09/28/2012  . Sciatica 08/10/2011  . S/P arthroscopy of left knee 08/10/2011  . Tibial plateau fracture 08/10/2011  . Pain in joint, lower leg 02/12/2011  . Stiffness of joint, not elsewhere classified, lower leg 02/12/2011  . 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 Meniscus, medial, derangement 12/29/2010  . CLOSED FRACTURE OF UPPER END OF TIBIA 08/12/2010     Physical Therapy Progress Note  Dates of Reporting Period: 02/10/2015 to 03/07/2015  Objective Reports of Subjective Statement:I would be doing good if I just got the swelling out.  Objective Measurements: see above   Goal Update: see  above   Plan: see above   Reason Skilled Services are Required: improve edema, gt and pain.   Rayetta Humphrey, PT CLT [phone] 03/07/2015, 2:39 PM  [place], [place], [address] Phone: [phone]   Fax:  [phone]

## 2015-03-10 ENCOUNTER — Ambulatory Visit (HOSPITAL_COMMUNITY): Payer: Commercial Managed Care - HMO | Admitting: Physical Therapy

## 2015-03-10 DIAGNOSIS — R269 Unspecified abnormalities of gait and mobility: Secondary | ICD-10-CM | POA: Diagnosis not present

## 2015-03-10 DIAGNOSIS — M25662 Stiffness of left knee, not elsewhere classified: Secondary | ICD-10-CM | POA: Diagnosis not present

## 2015-03-10 DIAGNOSIS — Z96652 Presence of left artificial knee joint: Secondary | ICD-10-CM | POA: Diagnosis not present

## 2015-03-10 DIAGNOSIS — Z658 Other specified problems related to psychosocial circumstances: Secondary | ICD-10-CM | POA: Diagnosis not present

## 2015-03-10 DIAGNOSIS — R609 Edema, unspecified: Secondary | ICD-10-CM

## 2015-03-10 DIAGNOSIS — M25562 Pain in left knee: Secondary | ICD-10-CM | POA: Diagnosis not present

## 2015-03-10 DIAGNOSIS — Z7409 Other reduced mobility: Secondary | ICD-10-CM | POA: Diagnosis not present

## 2015-03-10 NOTE — Therapy (Signed)
[place], [place], [address] Phone: [phone]   Fax:  [phone]  Physical Therapy Treatment  Patient Details  Name: Colleen Lee MRN: [id number] Date of Birth: April 25, 1956 Referring Provider:  Rosita Fire, MD  Encounter Date: 03/10/2015      PT End of Session - 03/10/15 1423    Visit Number 12   Number of Visits 23   Date for PT Re-Evaluation 04/06/15   Authorization Type [employer]  (G-codes done 10th session)   Authorization Time Period 02/10/15 to 04/13/15   Authorization - Visit Number 12   Authorization - Number of Visits 20   PT Start Time 8828   PT Stop Time 1430   PT Time Calculation (min) 45 min   Activity Tolerance Patient tolerated treatment well   Behavior During Therapy [facility] for tasks assessed/performed      Past Medical History  Diagnosis Date  . Hypertension   . Fibroids   . [name]     HCV RNA positive 09/2012  . GERD (gastroesophageal reflux disease)   . H. pylori infection 2014    treated with pylera  . Asthma   . PONV (postoperative nausea and vomiting)     pt thinks maybe once she had N&V  . Cirrhosis     Metavir score F4 on elastography 2015  . Fibromyalgia   . Chronic back pain   . Sciatica of left side     Past Surgical History  Procedure Laterality Date  . Knee surgery      left knee  . Colonoscopy  01/18/2008    MKL:KJZPHX rectum.  Long redundant colon, a diminutive sigmoid polyp status post cold biopsy removed. Hyperplastic polyp. Repeat colonoscopy June 2014 due to family history of colon cancer  . Esophagogastroduodenoscopy  01/18/2008    RMR: Normal esophagus, normal  stomach  . Colonoscopy with esophagogastroduodenoscopy (egd) N/A 11/02/2012    TAV:WPVXYIAX gastric mucosa of doubtful, +H.pylori. Incomplete colonoscopy due to patient unable to tolerate exam, proximal colon seen. Patient refused ACBE.  . Colonoscopy with propofol N/A 03/08/2013    Dr.  Gala Romney: colonic polyp-removed as scribed above. Internal Hemorrhoids. Pathology did not reveal any colonic tissue, only mucus. SURVEILLANCE DUE Aug 2019  . Polypectomy N/A 03/08/2013    Procedure: POLYPECTOMY;  Surgeon: Daneil Dolin, MD;  Location: [facility];  Service: Endoscopy;  Laterality: N/A;  . Multiple extractions with alveoloplasty N/A 10/15/2013    Procedure: MULTIPLE EXTRACTION WITH ALVEOLOPLASTY;  Surgeon: Gae Bon, DDS;  Location: [facility];  Service: Oral Surgery;  Laterality: N/A;  . Total knee arthroplasty Left 02/05/2015    Procedure: LEFT TOTAL KNEE ARTHROPLASTY;  Surgeon: Carole Civil, MD;  Location: [facility];  Service: Orthopedics;  Laterality: Left;    There were no vitals filed for this visit.  Visit Diagnosis:  Edema  Knee stiffness, left      Subjective Assessment - 03/10/15 1351    Subjective Pt states that she is not having pain just tightness.    Currently in Pain? No/denies               [facility] Adult PT Treatment/Exercise - 03/10/15 0001    Exercises   Exercises Knee/Hip   Knee/Hip Exercises: Stretches   Active Hamstring Stretch Left;3 reps;30 seconds   Quad Stretch 3 reps;30 seconds   Knee: Self-Stretch to increase Flexion Left;3 reps;30 seconds   Gastroc Stretch Both;3 reps;30 seconds   Press photographer  Limitations slant board    Knee/Hip Exercises: Aerobic   Stationary Bike full rotation x 10'   Knee/Hip Exercises: Standing   Knee Flexion Left;10 reps   Knee Flexion Limitations off 8" box    Terminal Knee Extension Left;10 reps   Knee/Hip Exercises: Supine   Quad Sets 10 reps   Knee Extension PROM;Left;3 sets   Knee Extension Limitations 5   Knee Flexion PROM   Knee Flexion Limitations 102   Knee/Hip Exercises: Prone   Contract/Relax to Increase Flexion x5   Other Prone Exercises terminal knee extension x 10    Manual Therapy   Manual Therapy Edema management;Manual Lymphatic Drainage (MLD);Passive ROM   Manual Lymphatic Drainage (MLD)  inguinal-axillary anastomosis followed by Lt LE    Passive ROM for flexion and extension                   PT Short Term Goals - 03/07/15 1442    PT SHORT TERM GOAL #1   Title Patient will demonstrate L knee ROM of 0-100 degrees    Time 2   Period Weeks   Status On-going   PT SHORT TERM GOAL #2   Title Patient to demonstrate L LE strength of at least 4-/5 and proximal muscle strength of at least 4-/5    Time 2   Period Weeks   Status Achieved   PT SHORT TERM GOAL #3   Title Patient will demonstrate the ability to ambulate unlimited distances with SPC and pain no more than 2/10 L knee    Time 2   Period Weeks   Status Partially Met   PT SHORT TERM GOAL #4   Title Patient will be independent in correctly and consistently performing HEP to be updated PRN    Time 2   Period Weeks   Status Achieved           PT Long Term Goals - 03/07/15 1443    PT LONG TERM GOAL #1   Title Patient will demonstrate L knee ROM of 0 degrees extension to at least 120 degrees flexion    Time 4   Period Weeks   Status On-going   PT LONG TERM GOAL #2   Title Patient will demonstrate bilateral LE strength of at least 4+/5 and proximal muscle strength at least 4/5    Time 4   Period Weeks   Status Achieved   PT LONG TERM GOAL #3   Title Patient will be able to ambulatue unlimited distances over even and uneven surfaces without assistive device and pain 0/10 L knee    Time 4   Period Weeks   Status On-going   PT LONG TERM GOAL #4   Title Patient will be able to ascend and descend full flight of stairs with no railings, no circumduction, and no unsteadiness throughout, pain L LE 0/10   Time 4   Period Weeks   Status On-going               Plan - 03/10/15 1430    Clinical Impression Statement Pt edem has decreased, (although still present),  allowing increased ROM.   Treatment focused on increasing ROM and decreasing edema only no strengthening due to pt having normal  strength at this time  Pt will continue to need skilled intervention to improve ROM and decrease edema.    PT Next Visit Plan focus on ROM and edema control.         Problem List Patient  Active Problem List   Diagnosis Date Noted  . Arthritis of knee, degenerative 02/05/2015  . History of Helicobacter pylori infection 10/30/2014  . Chronic [name] with cirrhosis 04/11/2014  . De Quervain's disease (radial styloid tenosynovitis) 12/25/2013  . Anorexia 11/21/2012  . FH: colon cancer 11/21/2012  . Early satiety 10/25/2012  . Bowel habit changes 10/25/2012  . Abdominal pain, epigastric 10/25/2012  . Abdominal bloating 10/25/2012  . Constipation 10/25/2012  . Abnormal weight loss 10/25/2012  . Chronic viral [name] 10/25/2012  . Radicular pain of left lower extremity 09/28/2012  . Back pain 09/28/2012  . Sciatica 08/10/2011  . S/P arthroscopy of left knee 08/10/2011  . Tibial plateau fracture 08/10/2011  . Pain in joint, lower leg 02/12/2011  . Stiffness of joint, not elsewhere classified, lower leg 02/12/2011  . 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 Meniscus, medial, derangement 12/29/2010  . CLOSED FRACTURE OF UPPER END OF TIBIA 08/12/2010   Rayetta Humphrey, PT CLT [phone] 03/10/2015, 2:33 PM  [facility] [address] [place], [place], [address] Phone: [phone]   Fax:  [phone]

## 2015-03-12 ENCOUNTER — Ambulatory Visit (HOSPITAL_COMMUNITY): Payer: Commercial Managed Care - HMO | Admitting: Physical Therapy

## 2015-03-12 ENCOUNTER — Telehealth (HOSPITAL_COMMUNITY): Payer: Self-pay | Admitting: Physical Therapy

## 2015-03-12 NOTE — Telephone Encounter (Signed)
Her eyes are swollen and she can not come in today

## 2015-03-14 ENCOUNTER — Ambulatory Visit (HOSPITAL_COMMUNITY): Payer: Commercial Managed Care - HMO | Admitting: Physical Therapy

## 2015-03-14 DIAGNOSIS — R269 Unspecified abnormalities of gait and mobility: Secondary | ICD-10-CM | POA: Diagnosis not present

## 2015-03-14 DIAGNOSIS — Z7409 Other reduced mobility: Secondary | ICD-10-CM | POA: Diagnosis not present

## 2015-03-14 DIAGNOSIS — R609 Edema, unspecified: Secondary | ICD-10-CM | POA: Diagnosis not present

## 2015-03-14 DIAGNOSIS — M25562 Pain in left knee: Secondary | ICD-10-CM | POA: Diagnosis not present

## 2015-03-14 DIAGNOSIS — Z96652 Presence of left artificial knee joint: Secondary | ICD-10-CM | POA: Diagnosis not present

## 2015-03-14 DIAGNOSIS — M25662 Stiffness of left knee, not elsewhere classified: Secondary | ICD-10-CM | POA: Diagnosis not present

## 2015-03-14 DIAGNOSIS — Z658 Other specified problems related to psychosocial circumstances: Secondary | ICD-10-CM | POA: Diagnosis not present

## 2015-03-14 NOTE — Patient Instructions (Signed)
Knee Extension Mobilization: Hang (Prone)   With table supporting thighs, place __1__ pound weight on right ankle. Hold _30___ minutes. Repeat ____1 times per set. Do 1____ sets per session. Do ___1_ sessions per day.  http://orth.exer.us/722   Copyright  VHI. All rights reserved.  Knee Extension Mobilization: Towel Prop   With rolled towel under right ankle, place _2___ pound weight across knee. Hold _30___ minutes. Repeat ___1_ times per set. Do 1____ sets per session. Do __1__ sessions per day.  http://orth.exer.us/720   Copyright  VHI. All rights reserved.

## 2015-03-14 NOTE — Therapy (Signed)
Colleen Lee, [place], [address] Phone: [phone]   Fax:  [phone]  Physical Therapy Treatment  Patient Details  Name: Colleen Lee MRN: [id number] Date of Birth: Apr 13, 1956 Referring Provider:  Rosita Fire, MD  Encounter Date: 03/14/2015      PT End of Session - 03/14/15 1557    Visit Number 13   Number of Visits 20   Date for PT Re-Evaluation 04/06/15   Authorization Type [employer]  (G-codes done 10th session)   Authorization Time Period 02/10/15 to 04/13/15   Authorization - Visit Number 13   Authorization - Number of Visits 20   PT Start Time 3664   PT Stop Time 1430   PT Time Calculation (min) 45 min   Activity Tolerance Patient tolerated treatment well      Past Medical History  Diagnosis Date  . Hypertension   . Fibroids   . [name]     HCV RNA positive 09/2012  . GERD (gastroesophageal reflux disease)   . H. pylori infection 2014    treated with pylera  . Asthma   . PONV (postoperative nausea and vomiting)     pt thinks maybe once she had N&V  . Cirrhosis     Metavir score F4 on elastography 2015  . Fibromyalgia   . Chronic back pain   . Sciatica of left side     Past Surgical History  Procedure Laterality Date  . Knee surgery      left knee  . Colonoscopy  01/18/2008    QIH:KVQQVZ rectum.  Long redundant colon, a diminutive sigmoid polyp status post cold biopsy removed. Hyperplastic polyp. Repeat colonoscopy June 2014 due to family history of colon cancer  . Esophagogastroduodenoscopy  01/18/2008    RMR: Normal esophagus, normal  stomach  . Colonoscopy with esophagogastroduodenoscopy (egd) N/A 11/02/2012    DGL:OVFIEPPI gastric mucosa of doubtful, +H.pylori. Incomplete colonoscopy due to patient unable to tolerate exam, proximal colon seen. Patient refused ACBE.  . Colonoscopy with propofol N/A 03/08/2013    Dr. Gala Romney: colonic polyp-removed as scribed above. Internal  Hemorrhoids. Pathology did not reveal any colonic tissue, only mucus. SURVEILLANCE DUE Aug 2019  . Polypectomy N/A 03/08/2013    Procedure: POLYPECTOMY;  Surgeon: Daneil Dolin, MD;  Location: [facility];  Service: Endoscopy;  Laterality: N/A;  . Multiple extractions with alveoloplasty N/A 10/15/2013    Procedure: MULTIPLE EXTRACTION WITH ALVEOLOPLASTY;  Surgeon: Gae Bon, DDS;  Location: [facility];  Service: Oral Surgery;  Laterality: N/A;  . Total knee arthroplasty Left 02/05/2015    Procedure: LEFT TOTAL KNEE ARTHROPLASTY;  Surgeon: Carole Civil, MD;  Location: [facility];  Service: Orthopedics;  Laterality: Left;    There were no vitals filed for this visit.  Visit Diagnosis:  Knee stiffness, left  Edema  Abnormality of gait      Subjective Assessment - 03/14/15 1547    Subjective Pt states she is doing better but does not feel that she is ready to be discharged    Pertinent History L TKR was performed on Wednesday (July 6th);    Currently in Pain? Yes   Pain Score 4    Pain Location Knee   Pain Orientation Left   Pain Descriptors / Indicators Aching   Pain Type Chronic pain             [facility] Adult PT Treatment/Exercise - 03/14/15 0001    Knee/Hip Exercises: Stretches  Passive Hamstring Stretch Left;3 reps;30 seconds   Quad Stretch 3 reps;30 seconds   Quad Stretch Limitations prone with rope    Knee: Self-Stretch to increase Flexion Left;3 reps;30 seconds   Knee: Self-Stretch Limitations Lt LE on second step    Knee/Hip Exercises: Aerobic   Stationary Bike full rotation x 10'   Knee/Hip Exercises: Standing   Knee Flexion Left;10 reps   Knee Flexion Limitations off 8" box    Functional Squat 10 reps   Functional Squat Limitations lifting kleenex box off 8" step with good body mechanics.    Knee/Hip Exercises: Supine   Quad Sets 10 reps   Heel Slides 5 reps   Knee Extension PROM   Knee Extension Limitations 7   Knee Flexion PROM   Knee Flexion Limitations 105    Knee/Hip Exercises: Prone   Contract/Relax to Increase Flexion x5   Other Prone Exercises terminal knee extension x 10    Manual Therapy   Manual Therapy Edema management;Manual Lymphatic Drainage (MLD);Passive ROM   Manual Lymphatic Drainage (MLD) decongestive manual techniques done both anterior and posteriorly.                 PT Education - 03/14/15 1554    Education provided Yes   Education Details Given for long slow stretch to increase extension.    Person(s) Educated Patient   Methods Explanation;Handout   Comprehension Verbalized understanding          PT Short Term Goals - 03/07/15 1442    PT SHORT TERM GOAL #1   Title Patient will demonstrate L knee ROM of 0-100 degrees    Time 2   Period Weeks   Status On-going   PT SHORT TERM GOAL #2   Title Patient to demonstrate L LE strength of at least 4-/5 and proximal muscle strength of at least 4-/5    Time 2   Period Weeks   Status Achieved   PT SHORT TERM GOAL #3   Title Patient will demonstrate the ability to ambulate unlimited distances with SPC and pain no more than 2/10 L knee    Time 2   Period Weeks   Status Partially Met   PT SHORT TERM GOAL #4   Title Patient will be independent in correctly and consistently performing HEP to be updated PRN    Time 2   Period Weeks   Status Achieved           PT Long Term Goals - 03/07/15 1443    PT LONG TERM GOAL #1   Title Patient will demonstrate L knee ROM of 0 degrees extension to at least 120 degrees flexion    Time 4   Period Weeks   Status On-going   PT LONG TERM GOAL #2   Title Patient will demonstrate bilateral LE strength of at least 4+/5 and proximal muscle strength at least 4/5    Time 4   Period Weeks   Status Achieved   PT LONG TERM GOAL #3   Title Patient will be able to ambulatue unlimited distances over even and uneven surfaces without assistive device and pain 0/10 L knee    Time 4   Period Weeks   Status On-going   PT LONG TERM  GOAL #4   Title Patient will be able to ascend and descend full flight of stairs with no railings, no circumduction, and no unsteadiness throughout, pain L LE 0/10   Time 4   Period Weeks  Status On-going               Plan - 03/14/15 1558    Clinical Impression Statement Pt continues to have decreased functional strength ie deep squats and edema limiting full ROM.  Pt continues to show gains in both these areas and continues to need skilled therapy to address these deficits and maximize pt functional ability.    Pt will benefit from skilled therapeutic intervention in order to improve on the following deficits Abnormal gait;Decreased coordination;Difficulty walking;Decreased endurance;Decreased activity tolerance;Decreased skin integrity;Pain;Decreased balance;Hypomobility;Decreased scar mobility;Decreased mobility;Decreased strength;Postural dysfunction   PT Treatment/Interventions ADLs/Self Care Home Management;Gait training;Stair training;Therapeutic activities;Therapeutic exercise;Balance training;Patient/family education;Manual techniques   PT Next Visit Plan focus on ROM and edema control.         Problem List Patient Active Problem List   Diagnosis Date Noted  . Arthritis of knee, degenerative 02/05/2015  . History of Helicobacter pylori infection 10/30/2014  . Chronic [name] with cirrhosis 04/11/2014  . De Quervain's disease (radial styloid tenosynovitis) 12/25/2013  . Anorexia 11/21/2012  . FH: colon cancer 11/21/2012  . Early satiety 10/25/2012  . Bowel habit changes 10/25/2012  . Abdominal pain, epigastric 10/25/2012  . Abdominal bloating 10/25/2012  . Constipation 10/25/2012  . Abnormal weight loss 10/25/2012  . Chronic viral [name] 10/25/2012  . Radicular pain of left lower extremity 09/28/2012  . Back pain 09/28/2012  . Sciatica 08/10/2011  . S/P arthroscopy of left knee 08/10/2011  . Tibial plateau fracture 08/10/2011  . Pain in joint, lower  leg 02/12/2011  . Stiffness of joint, not elsewhere classified, lower leg 02/12/2011  . 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 Meniscus, medial, derangement 12/29/2010  . CLOSED FRACTURE OF UPPER END OF TIBIA 08/12/2010   Rayetta Humphrey, PT CLT [phone] 03/14/2015, 4:00 PM  [facility] [address] [place], [place], [address] Phone: [phone]   Fax:  [phone]

## 2015-03-17 ENCOUNTER — Ambulatory Visit (INDEPENDENT_AMBULATORY_CARE_PROVIDER_SITE_OTHER): Payer: Medicaid Other | Admitting: Orthopedic Surgery

## 2015-03-17 ENCOUNTER — Encounter: Payer: Self-pay | Admitting: Orthopedic Surgery

## 2015-03-17 VITALS — BP 161/88 | Ht 66.0 in | Wt 178.0 lb

## 2015-03-17 DIAGNOSIS — M5410 Radiculopathy, site unspecified: Secondary | ICD-10-CM

## 2015-03-17 DIAGNOSIS — Z4789 Encounter for other orthopedic aftercare: Secondary | ICD-10-CM

## 2015-03-17 DIAGNOSIS — Z96652 Presence of left artificial knee joint: Secondary | ICD-10-CM

## 2015-03-17 MED ORDER — HYDROCODONE-ACETAMINOPHEN 7.5-325 MG PO TABS: 1.0000 | ORAL_TABLET | ORAL | Status: DC | PRN

## 2015-03-17 MED ORDER — IBUPROFEN 800 MG PO TABS: 800.0000 mg | ORAL_TABLET | Freq: Three times a day (TID) | ORAL | Status: DC | PRN

## 2015-03-17 NOTE — Progress Notes (Signed)
Patient ID: Colleen Lee, female   DOB: 1955-10-17, 59 y.o.   MRN: [id number]  Follow up visit  Chief Complaint  Patient presents with  . Follow-up    4 week recheck on left TKA,left knee replacement. DOS 02-05-15.    BP 161/88 mmHg  Ht 5\' 6"  (1.676 m)  Wt 178 lb (80.74 kg)  BMI 28.74 kg/m2  Encounter Diagnoses  Name Primary?  . Surgical aftercare, musculoskeletal system Yes  . Status post left knee replacement   . Radicular pain of left lower extremity     Patient comes in for a follow-up visit status post knee replacement she is now about 6 weeks out. Her knee flexion is past 90 and about 95 she has a small flexion contracture 5 or less. She complains of lateral radicular pain related to her lumbar spine condition which she had prior to surgery. She complains of an occasional medial knee pain that will come intermittently and does not affect her function  She will continue with physical therapy and follow-up with [place] again in 6 weeks  Meds ordered this encounter  Medications  . ibuprofen (ADVIL,MOTRIN) 800 MG tablet    Sig: Take 1 tablet (800 mg total) by mouth every 8 (eight) hours as needed.    Dispense:  90 tablet    Refill:  5  . HYDROcodone-acetaminophen (NORCO) 7.5-325 MG per tablet    Sig: Take 1 tablet by mouth every 4 (four) hours as needed for moderate pain.    Dispense:  112 tablet    Refill:  0

## 2015-03-25 DIAGNOSIS — I10 Essential (primary) hypertension: Secondary | ICD-10-CM | POA: Diagnosis not present

## 2015-03-25 DIAGNOSIS — E784 Other hyperlipidemia: Secondary | ICD-10-CM | POA: Diagnosis not present

## 2015-03-27 ENCOUNTER — Ambulatory Visit (HOSPITAL_COMMUNITY): Payer: Commercial Managed Care - HMO | Admitting: Physical Therapy

## 2015-03-27 DIAGNOSIS — M25662 Stiffness of left knee, not elsewhere classified: Secondary | ICD-10-CM | POA: Diagnosis not present

## 2015-03-27 DIAGNOSIS — Z7409 Other reduced mobility: Secondary | ICD-10-CM | POA: Diagnosis not present

## 2015-03-27 DIAGNOSIS — M25562 Pain in left knee: Secondary | ICD-10-CM | POA: Diagnosis not present

## 2015-03-27 DIAGNOSIS — Z96652 Presence of left artificial knee joint: Secondary | ICD-10-CM | POA: Diagnosis not present

## 2015-03-27 DIAGNOSIS — Z658 Other specified problems related to psychosocial circumstances: Secondary | ICD-10-CM | POA: Diagnosis not present

## 2015-03-27 DIAGNOSIS — R609 Edema, unspecified: Secondary | ICD-10-CM | POA: Diagnosis not present

## 2015-03-27 DIAGNOSIS — R269 Unspecified abnormalities of gait and mobility: Secondary | ICD-10-CM | POA: Diagnosis not present

## 2015-03-27 NOTE — Therapy (Signed)
[place], [place], [address] Phone: [phone]   Fax:  [phone]  Physical Therapy Treatment  Patient Details  Name: Colleen Lee MRN: [id number] Date of Birth: 01-28-1956 Referring Provider:  Rosita Fire, MD  Encounter Date: 03/27/2015      PT End of Session - 03/27/15 1556    Visit Number 14   Number of Visits 20   Date for PT Re-Evaluation 04/06/15   Authorization Type [employer]  (G-codes done 10th session)   Authorization Time Period 02/10/15 to 04/13/15   Authorization - Visit Number 14   Authorization - Number of Visits 20   PT Start Time 8144   PT Stop Time 1602   PT Time Calculation (min) 47 min   Equipment Utilized During Treatment Gait belt   Activity Tolerance Patient tolerated treatment well   Behavior During Therapy [facility] for tasks assessed/performed      Past Medical History  Diagnosis Date  . Hypertension   . Fibroids   . [name]     HCV RNA positive 09/2012  . GERD (gastroesophageal reflux disease)   . H. pylori infection 2014    treated with pylera  . Asthma   . PONV (postoperative nausea and vomiting)     pt thinks maybe once she had N&V  . Cirrhosis     Metavir score F4 on elastography 2015  . Fibromyalgia   . Chronic back pain   . Sciatica of left side     Past Surgical History  Procedure Laterality Date  . Knee surgery      left knee  . Colonoscopy  01/18/2008    YJE:HUDJSH rectum.  Long redundant colon, a diminutive sigmoid polyp status post cold biopsy removed. Hyperplastic polyp. Repeat colonoscopy June 2014 due to family history of colon cancer  . Esophagogastroduodenoscopy  01/18/2008    RMR: Normal esophagus, normal  stomach  . Colonoscopy with esophagogastroduodenoscopy (egd) N/A 11/02/2012    FWY:OVZCHYIF gastric mucosa of doubtful, +H.pylori. Incomplete colonoscopy due to patient unable to tolerate exam, proximal colon seen. Patient refused ACBE.   . Colonoscopy with propofol N/A 03/08/2013    Dr. Gala Romney: colonic polyp-removed as scribed above. Internal Hemorrhoids. Pathology did not reveal any colonic tissue, only mucus. SURVEILLANCE DUE Aug 2019  . Polypectomy N/A 03/08/2013    Procedure: POLYPECTOMY;  Surgeon: Daneil Dolin, MD;  Location: [facility];  Service: Endoscopy;  Laterality: N/A;  . Multiple extractions with alveoloplasty N/A 10/15/2013    Procedure: MULTIPLE EXTRACTION WITH ALVEOLOPLASTY;  Surgeon: Gae Bon, DDS;  Location: [place];  Service: Oral Surgery;  Laterality: N/A;  . Total knee arthroplasty Left 02/05/2015    Procedure: LEFT TOTAL KNEE ARTHROPLASTY;  Surgeon: Carole Civil, MD;  Location: [facility];  Service: Orthopedics;  Laterality: Left;    There were no vitals filed for this visit.  Visit Diagnosis:  Knee stiffness, left  Status post total left knee replacement  Left knee pain  Impaired functional mobility and activity tolerance      Subjective Assessment - 03/27/15 1555    Subjective Pt reports that she feels that she has been doing better, her knee does not hurt as much as it used to.    Currently in Pain? Yes   Pain Score 4                  [facility] Adult PT Treatment/Exercise - 03/27/15 0001    Knee/Hip Exercises:  Stretches   Active Hamstring Stretch Left;3 reps;30 seconds   Active Hamstring Stretch Limitations 12" step   Quad Stretch 3 reps;30 seconds   Quad Stretch Limitations prone with rope    Knee: Self-Stretch to increase Flexion Left;10 seconds  10 res   Knee: Self-Stretch Limitations 12" step   Gastroc Stretch Both;3 reps;30 seconds   Gastroc Stretch Limitations slant board    Knee/Hip Exercises: Aerobic   Stationary Bike full rotation x 10'   Knee/Hip Exercises: Standing   Terminal Knee Extension 15 reps   Terminal Knee Extension Limitations green tband   Step Down Left;10 reps;Step Height: 4"   Knee/Hip Exercises: Supine   Quad Sets 15 reps  5 second hold   Heel Slides  10 reps   Knee Extension PROM   Knee Extension Limitations -4   Knee Flexion PROM   Knee Flexion Limitations 117   Knee/Hip Exercises: Prone   Other Prone Exercises terminal knee extension 5x5"   Manual Therapy   Manual Therapy Joint mobilization   Joint Mobilization grade III joint mobs to tibiofemoral joint to improve extension                  PT Short Term Goals - 03/07/15 1442    PT SHORT TERM GOAL #1   Title Patient will demonstrate L knee ROM of 0-100 degrees    Time 2   Period Weeks   Status On-going   PT SHORT TERM GOAL #2   Title Patient to demonstrate L LE strength of at least 4-/5 and proximal muscle strength of at least 4-/5    Time 2   Period Weeks   Status Achieved   PT SHORT TERM GOAL #3   Title Patient will demonstrate the ability to ambulate unlimited distances with SPC and pain no more than 2/10 L knee    Time 2   Period Weeks   Status Partially Met   PT SHORT TERM GOAL #4   Title Patient will be independent in correctly and consistently performing HEP to be updated PRN    Time 2   Period Weeks   Status Achieved           PT Long Term Goals - 03/07/15 1443    PT LONG TERM GOAL #1   Title Patient will demonstrate L knee ROM of 0 degrees extension to at least 120 degrees flexion    Time 4   Period Weeks   Status On-going   PT LONG TERM GOAL #2   Title Patient will demonstrate bilateral LE strength of at least 4+/5 and proximal muscle strength at least 4/5    Time 4   Period Weeks   Status Achieved   PT LONG TERM GOAL #3   Title Patient will be able to ambulatue unlimited distances over even and uneven surfaces without assistive device and pain 0/10 L knee    Time 4   Period Weeks   Status On-going   PT LONG TERM GOAL #4   Title Patient will be able to ascend and descend full flight of stairs with no railings, no circumduction, and no unsteadiness throughout, pain L LE 0/10   Time 4   Period Weeks   Status On-going                Plan - 03/27/15 1556    Clinical Impression Statement Pt demonstrated improved range of motion following PROM and joint mobilizations today, achieving 4-117 degrees. Treatment focused on improving  range of motion to allow pt to complete functional activities such as squats and stairs with proper form. She reported difficulty with forward step downs, but was able to complete the activity without any valgus of L knee.    PT Next Visit Plan Continue with ROM activities, manual therapy to improve ROM        Problem List Patient Active Problem List   Diagnosis Date Noted  . Arthritis of knee, degenerative 02/05/2015  . History of Helicobacter pylori infection 10/30/2014  . Chronic [name] with cirrhosis 04/11/2014  . De Quervain's disease (radial styloid tenosynovitis) 12/25/2013  . Anorexia 11/21/2012  . FH: colon cancer 11/21/2012  . Early satiety 10/25/2012  . Bowel habit changes 10/25/2012  . Abdominal pain, epigastric 10/25/2012  . Abdominal bloating 10/25/2012  . Constipation 10/25/2012  . Abnormal weight loss 10/25/2012  . Chronic viral [name] 10/25/2012  . Radicular pain of left lower extremity 09/28/2012  . Back pain 09/28/2012  . Sciatica 08/10/2011  . S/P arthroscopy of left knee 08/10/2011  . Tibial plateau fracture 08/10/2011  . Pain in joint, lower leg 02/12/2011  . Stiffness of joint, not elsewhere classified, lower leg 02/12/2011  . 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 Meniscus, medial, derangement 12/29/2010  . CLOSED FRACTURE OF UPPER END OF TIBIA 08/12/2010    Hilma Favors, PT, DPT [phone] 03/27/2015, 4:03 PM  [place], [place], [address] Phone: [phone]   Fax:  [phone]

## 2015-03-28 ENCOUNTER — Ambulatory Visit (HOSPITAL_COMMUNITY): Payer: Commercial Managed Care - HMO | Admitting: Physical Therapy

## 2015-03-31 ENCOUNTER — Ambulatory Visit (HOSPITAL_COMMUNITY): Payer: Commercial Managed Care - HMO

## 2015-04-01 ENCOUNTER — Emergency Department (HOSPITAL_COMMUNITY)
Admission: EM | Admit: 2015-04-01 | Discharge: 2015-04-01 | Disposition: A | Discharge status: Home / Self Care | Payer: Commercial Managed Care - HMO | Attending: Emergency Medicine | Admitting: Emergency Medicine

## 2015-04-01 ENCOUNTER — Emergency Department (HOSPITAL_COMMUNITY): Payer: Commercial Managed Care - HMO

## 2015-04-01 ENCOUNTER — Encounter (HOSPITAL_COMMUNITY): Payer: Self-pay

## 2015-04-01 DIAGNOSIS — Z86018 Personal history of other benign neoplasm: Secondary | ICD-10-CM | POA: Diagnosis not present

## 2015-04-01 DIAGNOSIS — Z7982 Long term (current) use of aspirin: Secondary | ICD-10-CM | POA: Diagnosis not present

## 2015-04-01 DIAGNOSIS — J45909 Unspecified asthma, uncomplicated: Secondary | ICD-10-CM | POA: Diagnosis not present

## 2015-04-01 DIAGNOSIS — Z8619 Personal history of other infectious and parasitic diseases: Secondary | ICD-10-CM | POA: Diagnosis not present

## 2015-04-01 DIAGNOSIS — R112 Nausea with vomiting, unspecified: Secondary | ICD-10-CM | POA: Diagnosis not present

## 2015-04-01 DIAGNOSIS — D259 Leiomyoma of uterus, unspecified: Secondary | ICD-10-CM | POA: Diagnosis not present

## 2015-04-01 DIAGNOSIS — R0989 Other specified symptoms and signs involving the circulatory and respiratory systems: Secondary | ICD-10-CM | POA: Diagnosis not present

## 2015-04-01 DIAGNOSIS — Z79899 Other long term (current) drug therapy: Secondary | ICD-10-CM | POA: Diagnosis not present

## 2015-04-01 DIAGNOSIS — I10 Essential (primary) hypertension: Secondary | ICD-10-CM | POA: Diagnosis not present

## 2015-04-01 DIAGNOSIS — M797 Fibromyalgia: Secondary | ICD-10-CM | POA: Insufficient documentation

## 2015-04-01 DIAGNOSIS — K219 Gastro-esophageal reflux disease without esophagitis: Secondary | ICD-10-CM | POA: Diagnosis not present

## 2015-04-01 DIAGNOSIS — I517 Cardiomegaly: Secondary | ICD-10-CM | POA: Diagnosis not present

## 2015-04-01 DIAGNOSIS — Z720 Tobacco use: Secondary | ICD-10-CM | POA: Diagnosis not present

## 2015-04-01 DIAGNOSIS — Z880 Allergy status to penicillin: Secondary | ICD-10-CM | POA: Diagnosis not present

## 2015-04-01 DIAGNOSIS — G8929 Other chronic pain: Secondary | ICD-10-CM | POA: Insufficient documentation

## 2015-04-01 DIAGNOSIS — R1012 Left upper quadrant pain: Secondary | ICD-10-CM | POA: Diagnosis not present

## 2015-04-01 DIAGNOSIS — K449 Diaphragmatic hernia without obstruction or gangrene: Secondary | ICD-10-CM | POA: Diagnosis not present

## 2015-04-01 DIAGNOSIS — R109 Unspecified abdominal pain: Secondary | ICD-10-CM | POA: Diagnosis present

## 2015-04-01 LAB — URINE MICROSCOPIC-ADD ON

## 2015-04-01 LAB — COMPREHENSIVE METABOLIC PANEL
ALT: 11 U/L — ABNORMAL LOW (ref 14–54)
AST: 15 U/L (ref 15–41)
Albumin: 5 g/dL (ref 3.5–5.0)
Alkaline Phosphatase: 82 U/L (ref 38–126)
Anion gap: 10 (ref 5–15)
BUN: 7 mg/dL (ref 6–20)
CO2: 23 mmol/L (ref 22–32)
Calcium: 10.9 mg/dL — ABNORMAL HIGH (ref 8.9–10.3)
Chloride: 105 mmol/L (ref 101–111)
Creatinine, Ser: 0.65 mg/dL (ref 0.44–1.00)
GFR calc Af Amer: >60 mL/min (ref >60)
GFR calc non Af Amer: >60 mL/min (ref >60)
Glucose, Bld: 126 mg/dL — ABNORMAL HIGH (ref 65–99)
Potassium: 4.2 mmol/L (ref 3.5–5.1)
Sodium: 138 mmol/L (ref 135–145)
Total Bilirubin: 1.1 mg/dL (ref 0.3–1.2)
Total Protein: 9.9 g/dL — ABNORMAL HIGH (ref 6.5–8.1)

## 2015-04-01 LAB — RAPID URINE DRUG SCREEN, HOSP PERFORMED
Amphetamines: NOT DETECTED
Barbiturates: NOT DETECTED
Benzodiazepines: NOT DETECTED
Cocaine: NOT DETECTED
Opiates: POSITIVE — AB
Tetrahydrocannabinol: POSITIVE — AB

## 2015-04-01 LAB — URINALYSIS, ROUTINE W REFLEX MICROSCOPIC
Bilirubin Urine: NEGATIVE
Glucose, UA: NEGATIVE mg/dL
Ketones, ur: 15 mg/dL — AB
Leukocytes, UA: NEGATIVE
Nitrite: NEGATIVE
Protein, ur: 100 mg/dL — AB
Specific Gravity, Urine: >1.03 — ABNORMAL HIGH (ref 1.005–1.030)
Urobilinogen, UA: 0.2 mg/dL (ref 0.0–1.0)
pH: 7 (ref 5.0–8.0)

## 2015-04-01 LAB — CBC
HCT: 44.9 % (ref 36.0–46.0)
Hemoglobin: 15 g/dL (ref 12.0–15.0)
MCH: 29.6 pg (ref 26.0–34.0)
MCHC: 33.4 g/dL (ref 30.0–36.0)
MCV: 88.7 fL (ref 78.0–100.0)
Platelets: 230 10*3/uL (ref 150–400)
RBC: 5.06 MIL/uL (ref 3.87–5.11)
RDW: 14 % (ref 11.5–15.5)
WBC: 7.1 10*3/uL (ref 4.0–10.5)

## 2015-04-01 LAB — LIPASE, BLOOD: Lipase: 15 U/L — ABNORMAL LOW (ref 22–51)

## 2015-04-01 LAB — I-STAT CG4 LACTIC ACID, ED: Lactic Acid, Venous: 2.11 mmol/L (ref 0.5–2.0)

## 2015-04-01 MED ORDER — IOHEXOL 300 MG/ML  SOLN
100.0000 mL | Freq: Once | INTRAMUSCULAR | Status: AC | PRN
  Administered 2015-04-01: 100 mL via INTRAVENOUS

## 2015-04-01 MED ORDER — OXYCODONE-ACETAMINOPHEN 5-325 MG PO TABS
1.0000 | ORAL_TABLET | Freq: Once | ORAL | Status: AC
  Administered 2015-04-01: 1 via ORAL
  Filled 2015-04-01: qty 1

## 2015-04-01 MED ORDER — SODIUM CHLORIDE 0.9 % IV SOLN
INTRAVENOUS | Status: DC
  Administered 2015-04-01: 16:00:00 via INTRAVENOUS

## 2015-04-01 MED ORDER — ONDANSETRON HCL 4 MG/2ML IJ SOLN
INTRAMUSCULAR | Status: AC
  Administered 2015-04-01: 4 mg via INTRAVENOUS
  Filled 2015-04-01: qty 2

## 2015-04-01 MED ORDER — OXYCODONE-ACETAMINOPHEN 5-325 MG PO TABS: 1.0000 | ORAL_TABLET | ORAL | Status: DC | PRN

## 2015-04-01 MED ORDER — FENTANYL CITRATE (PF) 100 MCG/2ML IJ SOLN
100.0000 ug | INTRAMUSCULAR | Status: DC | PRN
  Administered 2015-04-01 (×2): 100 ug via INTRAVENOUS
  Filled 2015-04-01 (×2): qty 2

## 2015-04-01 MED ORDER — ONDANSETRON HCL 4 MG/2ML IJ SOLN
4.0000 mg | Freq: Once | INTRAMUSCULAR | Status: AC
  Administered 2015-04-01: 4 mg via INTRAVENOUS

## 2015-04-01 MED ORDER — ONDANSETRON HCL 4 MG/2ML IJ SOLN
4.0000 mg | Freq: Once | INTRAMUSCULAR | Status: AC
  Administered 2015-04-01: 4 mg via INTRAVENOUS
  Filled 2015-04-01: qty 2

## 2015-04-01 MED ORDER — HYDROMORPHONE HCL 1 MG/ML IJ SOLN
1.0000 mg | Freq: Once | INTRAMUSCULAR | Status: AC
  Administered 2015-04-01: 1 mg via INTRAVENOUS
  Filled 2015-04-01: qty 1

## 2015-04-01 MED ORDER — IOHEXOL 300 MG/ML  SOLN
25.0000 mL | Freq: Once | INTRAMUSCULAR | Status: AC | PRN
  Administered 2015-04-01: 25 mL via ORAL

## 2015-04-01 MED ORDER — SODIUM CHLORIDE 0.9 % IV BOLUS (SEPSIS)
1000.0000 mL | Freq: Once | INTRAVENOUS | Status: AC
  Administered 2015-04-01: 1000 mL via INTRAVENOUS

## 2015-04-01 NOTE — ED Provider Notes (Signed)
CSN: [id number]     Arrival date & time 04/01/15  1029 History  This chart was scribed for Daleen Bo, MD by Irene Pap, ED Scribe. This patient was seen in room APA09/APA09 and patient care was started at 12:16 PM.    Chief Complaint  Patient presents with  . Abdominal Pain   The history is provided by the patient. No language interpreter was used.   HPI Comments: Colleen Lee is a 59 y.o. female with hx of HTN, Hep C, GERD, H. Pylori infection, Cirrhosis, and Fibromyalgia who presents to the Emergency Department complaining of nausea, vomiting, diarrhea, and diaphoresis onset one day ago. Pt reports associated abdominal pain and cramping, chills, and weakness. Pt denies fever, chest pain or cough. Pt states that she is dehydrated and needs fluids. She is out of her narcotic pain medicine, for several days. She describes her pain is severe, and unrelenting. There are no other known modifying factors.  Past Medical History  Diagnosis Date  . Hypertension   . Fibroids   . [name]     HCV RNA positive 09/2012  . GERD (gastroesophageal reflux disease)   . H. pylori infection 2014    treated with pylera  . Asthma   . PONV (postoperative nausea and vomiting)     pt thinks maybe once she had N&V  . Cirrhosis     Metavir score F4 on elastography 2015  . Fibromyalgia   . Chronic back pain   . Sciatica of left side    Past Surgical History  Procedure Laterality Date  . Knee surgery      left knee  . Colonoscopy  01/18/2008    MGN:[id number] rectum.  Long redundant colon, a diminutive sigmoid polyp status post cold biopsy removed. Hyperplastic polyp. Repeat colonoscopy June 2014 due to family history of colon cancer  . Esophagogastroduodenoscopy  01/18/2008    RMR: Normal esophagus, normal  stomach  . Colonoscopy with esophagogastroduodenoscopy (egd) N/A 11/02/2012    UGQ:[id number] gastric mucosa of doubtful, +H.pylori. Incomplete colonoscopy due to patient unable to tolerate exam,  proximal colon seen. Patient refused ACBE.  . Colonoscopy with propofol N/A 03/08/2013    Dr. Gala Romney: colonic polyp-removed as scribed above. Internal Hemorrhoids. Pathology did not reveal any colonic tissue, only mucus. SURVEILLANCE DUE Aug 2019  . Polypectomy N/A 03/08/2013    Procedure: POLYPECTOMY;  Surgeon: Daneil Dolin, MD;  Location: [facility];  Service: Endoscopy;  Laterality: N/A;  . Multiple extractions with alveoloplasty N/A 10/15/2013    Procedure: MULTIPLE EXTRACTION WITH ALVEOLOPLASTY;  Surgeon: Gae Bon, DDS;  Location: [facility];  Service: Oral Surgery;  Laterality: N/A;  . Total knee arthroplasty Left 02/05/2015    Procedure: LEFT TOTAL KNEE ARTHROPLASTY;  Surgeon: Carole Civil, MD;  Location: [facility];  Service: Orthopedics;  Laterality: Left;   Family History  Problem Relation Age of Onset  . Colon cancer Brother        . Asthma      FH  . Multiple myeloma Brother   . Liver cancer Sister   . Prostate cancer Brother   . Pancreatic cancer Brother   . Cancer Other     niece, age 106, primary unknown   Social History  Substance Use Topics  . Smoking status: Current Every Day Smoker -- 1.00 packs/day for 40 years    Types: Cigarettes  . Smokeless tobacco: None     Comment: Smokes one pack of cigarettes daily  . Alcohol  Use: No     Comment: former, last Jan 2014   OB History    Gravida Para Term Preterm AB TAB SAB Ectopic Multiple Living            1     Review of Systems  Constitutional: Positive for chills and diaphoresis. Negative for fever.  Respiratory: Negative for cough.   Cardiovascular: Negative for chest pain.  Gastrointestinal: Positive for nausea, vomiting and diarrhea.  Neurological: Positive for weakness.  All other systems reviewed and are negative.  Allergies  Penicillins  Home Medications   Prior to Admission medications   Medication Sig Start Date End Date Taking? Authorizing Provider  amLODipine (NORVASC) 5 MG tablet Take 5 mg by mouth  daily.   Yes Historical Provider, MD  aspirin EC 325 MG EC tablet Take 1 tablet (325 mg total) by mouth 2 (two) times daily. 02/08/15  Yes Carole Civil, MD  Cyanocobalamin (VITAMIN B 12 PO) Take 1 tablet by mouth daily.   Yes Historical Provider, MD  cyclobenzaprine (FLEXERIL) 10 MG tablet Take 10 mg by mouth daily. 12/30/14  Yes Historical Provider, MD  esomeprazole (NEXIUM) 40 MG capsule Take 1 capsule (40 mg total) by mouth every morning. 12/19/14  Yes Orvil Feil, NP  gabapentin (NEURONTIN) 100 MG capsule Take 1 capsule (100 mg total) by mouth 3 (three) times daily. 02/17/15  Yes Carole Civil, MD  HYDROcodone-acetaminophen (Blunt) 7.5-325 MG per tablet Take 1 tablet by mouth every 4 (four) hours as needed for moderate pain. 03/17/15  Yes Carole Civil, MD  lisinopril (PRINIVIL,ZESTRIL) 20 MG tablet Take 20 mg by mouth daily.   Yes Historical Provider, MD  megestrol (MEGACE) 40 MG tablet Take 40 mg by mouth daily.   Yes Historical Provider, MD  potassium chloride SA (K-DUR,KLOR-CON) 20 MEQ tablet Take 20 mEq by mouth daily.   Yes Historical Provider, MD  predniSONE (DELTASONE) 10 MG tablet Take 10 mg by mouth daily with breakfast.   Yes Historical Provider, MD  rifabutin (MYCOBUTIN) 150 MG capsule Take 2 capsules (300 mg total) by mouth daily. 12/19/14  Yes Orvil Feil, NP  HYDROcodone-acetaminophen (NORCO) 10-325 MG per tablet Take 1 tablet by mouth every 4 (four) hours as needed. Patient not taking: Reported on 04/01/2015 03/04/15   Carole Civil, MD  ibuprofen (ADVIL,MOTRIN) 800 MG tablet Take 1 tablet (800 mg total) by mouth every 8 (eight) hours as needed. Patient taking differently: Take 800 mg by mouth every 8 (eight) hours as needed for mild pain.  03/17/15   Carole Civil, MD  nicotine (NICODERM CQ - DOSED IN MG/24 HOURS) 21 mg/24hr patch Place 21 mg onto the skin daily.     Historical Provider, MD  ondansetron (ZOFRAN) 8 MG tablet Take 1 tablet (8 mg total) by mouth  every 8 (eight) hours as needed. Patient taking differently: Take 8 mg by mouth every 8 (eight) hours as needed for nausea or vomiting.  08/14/14   Ripley Fraise, MD  predniSONE (STERAPRED UNI-PAK 48 TAB) 10 MG (48) TBPK tablet Take by mouth daily. Use as directed Patient not taking: Reported on 02/28/2015 02/17/15   Carole Civil, MD  tiZANidine (ZANAFLEX) 2 MG tablet Take 1 tablet (2 mg total) by mouth every 8 (eight) hours as needed. Patient taking differently: Take 2 mg by mouth every 8 (eight) hours as needed for muscle spasms.  02/08/15   Carole Civil, MD   BP 208/116 mmHg  Pulse  71  Temp(Src) 98.1 F (36.7 C) (Oral)  Resp 18  SpO2 100%  Physical Exam  Constitutional: She is oriented to person, place, and time. She appears well-developed and well-nourished.  HENT:  Head: Normocephalic and atraumatic.  Eyes: Conjunctivae and EOM are normal. Pupils are equal, round, and reactive to light.  Neck: Normal range of motion and phonation normal. Neck supple.  Cardiovascular: Normal rate and regular rhythm.   Pulmonary/Chest: Effort normal and breath sounds normal. She exhibits no tenderness.  Abdominal: Soft. She exhibits no distension. There is no tenderness. There is no guarding.  Hypoactive bowel sounds; diffuse abdominal pain with more LLQ abdominal tenderness  Musculoskeletal: Normal range of motion.  Neurological: She is alert and oriented to person, place, and time. She exhibits normal muscle tone.  Skin: Skin is warm. She is diaphoretic.  Psychiatric: She has a normal mood and affect. Her behavior is normal. Judgment and thought content normal.  Nursing note and vitals reviewed.   ED Course  Procedures (including critical care time) DIAGNOSTIC STUDIES: Oxygen Saturation is 100% on RA, normal by my interpretation.    COORDINATION OF CARE: 12:19 PM-Discussed treatment plan which includes labs, UA, and x-ray with pt at bedside and pt agreed to plan.   Medications   fentaNYL (SUBLIMAZE) injection 100 mcg (100 mcg Intravenous Given 04/01/15 1323)  0.9 %  sodium chloride infusion ( Intravenous New Bag/Given 04/01/15 1558)  ondansetron (ZOFRAN) injection 4 mg (4 mg Intravenous Given 04/01/15 1130)  ondansetron (ZOFRAN) injection 4 mg (4 mg Intravenous Given 04/01/15 1226)  ondansetron (ZOFRAN) injection 4 mg (4 mg Intravenous Given 04/01/15 1332)  sodium chloride 0.9 % bolus 1,000 mL (0 mLs Intravenous Stopped 04/01/15 1554)  iohexol (OMNIPAQUE) 300 MG/ML solution 100 mL (100 mLs Intravenous Contrast Given 04/01/15 1528)  iohexol (OMNIPAQUE) 300 MG/ML solution 25 mL (25 mLs Oral Contrast Given 04/01/15 1500)  HYDROmorphone (DILAUDID) injection 1 mg (1 mg Intravenous Given 04/01/15 1625)  oxyCODONE-acetaminophen (PERCOCET/ROXICET) 5-325 MG per tablet 1 tablet (1 tablet Oral Given 04/01/15 1625)    Patient Vitals for the past 24 hrs:  BP Temp Temp src Pulse Resp SpO2  04/01/15 1700 (!) 184/103 mmHg - - 81 14 98 %  04/01/15 1630 (!) 199/114 mmHg - - 84 (!) 28 97 %  04/01/15 1601 (!) 213/98 mmHg - - 82 19 100 %  04/01/15 1330 162/100 mmHg - - 69 15 99 %  04/01/15 1230 (!) 213/116 mmHg - - 72 (!) 38 100 %  04/01/15 1200 (!) 208/116 mmHg - - 71 18 100 %  04/01/15 1039 (!) 210/126 mmHg 98.1 F (36.7 C) Oral 78 22 100 %    5:38 PM Reevaluation with update and discussion. After initial assessment and treatment, an updated evaluation reveals her pain is now controlled after a single dose of Dilaudid. Findings discussed with the patient and family member, all questions were answered. [place] Review Labs Reviewed  LIPASE, BLOOD - Abnormal; Notable for the following:    Lipase 15 (*)    All other components within normal limits  COMPREHENSIVE METABOLIC PANEL - Abnormal; Notable for the following:    Glucose, Bld 126 (*)    Calcium 10.9 (*)    Total Protein 9.9 (*)    ALT 11 (*)    All other components within normal limits  CBC  URINALYSIS, ROUTINE  W REFLEX MICROSCOPIC (NOT AT [facility])    Imaging Review No results found.    EKG  Interpretation None      MDM   Final diagnoses:  Left upper quadrant pain   Nonspecific abdominal pain, screening CT scan negative for acute pathology. Patient has been receiving frequent high-dose narcotics, for several months. It is possible that she is going through narcotic withdrawal since he comes for her acute abdominal discomfort today. She is likely using her prescribed narcotic pain medicine, inappropriately. Doubt serious bacterial infection, metabolic instability, small bowel obstruction or impending vascular collapse.  Nursing Notes Reviewed/ Care Coordinated Applicable Imaging Reviewed Interpretation of Laboratory Data incorporated into ED treatment  The patient appears reasonably screened and/or stabilized for discharge and I doubt any other medical condition or other [facility] requiring further screening, evaluation, or treatment in the ED at this time prior to discharge.  Plan: Home Medications- Percocet; Home Treatments- rest; return here if the recommended treatment, does not improve the symptoms; Recommended follow up- PCP prn    I personally performed the services described in this documentation, which was scribed in my presence. The recorded information has been reviewed and is accurate.    Daleen Bo, MD 04/01/15 1739

## 2015-04-01 NOTE — ED Notes (Signed)
MD at bedside. 

## 2015-04-01 NOTE — ED Notes (Signed)
MD Eulis Foster notified of lactic acid istat result.

## 2015-04-01 NOTE — ED Notes (Signed)
Pt c/o headache, edp notified of vs and headache.  Percocet ordered.

## 2015-04-01 NOTE — Discharge Instructions (Signed)

## 2015-04-01 NOTE — ED Notes (Signed)
Pt c/o n/v/d and abd pain since yesterday.

## 2015-04-02 ENCOUNTER — Telehealth (HOSPITAL_COMMUNITY): Payer: Self-pay | Admitting: Physical Therapy

## 2015-04-02 ENCOUNTER — Ambulatory Visit (HOSPITAL_COMMUNITY): Payer: Commercial Managed Care - HMO | Admitting: Physical Therapy

## 2015-04-02 NOTE — Telephone Encounter (Signed)
Pt a no show for 3:15 appt today, attempted to call pt regarding next appt time. Phone was answered, when PT asked to speak with pt, person hung up phone.   Hilma Favors, PT, DPT [phone]

## 2015-04-03 ENCOUNTER — Telehealth: Payer: Self-pay | Admitting: Orthopedic Surgery

## 2015-04-03 ENCOUNTER — Other Ambulatory Visit: Payer: Self-pay | Admitting: *Deleted

## 2015-04-03 MED ORDER — HYDROCODONE-ACETAMINOPHEN 10-325 MG PO TABS: 1.0000 | ORAL_TABLET | Freq: Four times a day (QID) | ORAL | Status: DC | PRN

## 2015-04-03 NOTE — Telephone Encounter (Signed)
Prescription available, patient aware  

## 2015-04-03 NOTE — Telephone Encounter (Signed)
Patient picked up Rx of hydrocodone 10-325mg 

## 2015-04-04 ENCOUNTER — Ambulatory Visit (HOSPITAL_COMMUNITY): Payer: Commercial Managed Care - HMO | Attending: Internal Medicine | Admitting: Physical Therapy

## 2015-04-04 ENCOUNTER — Telehealth (HOSPITAL_COMMUNITY): Payer: Self-pay | Admitting: Physical Therapy

## 2015-04-04 DIAGNOSIS — Z7409 Other reduced mobility: Secondary | ICD-10-CM | POA: Insufficient documentation

## 2015-04-04 DIAGNOSIS — M25562 Pain in left knee: Secondary | ICD-10-CM | POA: Insufficient documentation

## 2015-04-04 DIAGNOSIS — R609 Edema, unspecified: Secondary | ICD-10-CM | POA: Insufficient documentation

## 2015-04-04 DIAGNOSIS — R269 Unspecified abnormalities of gait and mobility: Secondary | ICD-10-CM | POA: Insufficient documentation

## 2015-04-04 DIAGNOSIS — Z96652 Presence of left artificial knee joint: Secondary | ICD-10-CM | POA: Insufficient documentation

## 2015-04-04 DIAGNOSIS — Z658 Other specified problems related to psychosocial circumstances: Secondary | ICD-10-CM | POA: Insufficient documentation

## 2015-04-04 DIAGNOSIS — M25662 Stiffness of left knee, not elsewhere classified: Secondary | ICD-10-CM | POA: Insufficient documentation

## 2015-04-04 NOTE — Telephone Encounter (Signed)
PT contacted RE missed appointments .  Has been ill but will be here next week . Rayetta Humphrey, [place] CLT [phone]

## 2015-04-08 ENCOUNTER — Ambulatory Visit (HOSPITAL_COMMUNITY): Payer: Commercial Managed Care - HMO

## 2015-04-08 VITALS — BP 172/90

## 2015-04-08 DIAGNOSIS — M25562 Pain in left knee: Secondary | ICD-10-CM

## 2015-04-08 DIAGNOSIS — Z96652 Presence of left artificial knee joint: Secondary | ICD-10-CM

## 2015-04-08 DIAGNOSIS — M25662 Stiffness of left knee, not elsewhere classified: Secondary | ICD-10-CM

## 2015-04-08 DIAGNOSIS — R6889 Other general symptoms and signs: Secondary | ICD-10-CM

## 2015-04-08 DIAGNOSIS — Z789 Other specified health status: Secondary | ICD-10-CM

## 2015-04-08 DIAGNOSIS — R609 Edema, unspecified: Secondary | ICD-10-CM

## 2015-04-08 DIAGNOSIS — R269 Unspecified abnormalities of gait and mobility: Secondary | ICD-10-CM

## 2015-04-08 DIAGNOSIS — Z7409 Other reduced mobility: Secondary | ICD-10-CM

## 2015-04-08 NOTE — Therapy (Signed)
[place], [place], [address] Phone: [phone]   Fax:  [phone]  Patient Details  Name: Colleen Lee MRN: [id number] Date of Birth: 1955-08-25 Referring Provider:  Rosita Fire, MD  Encounter Date: 04/08/2015 Pt entered dept following week of stomach virus, stated she felt light headed and BP was taken.  No session held today due to high BP of 173/90 mmHg.  [address] LPTA; [place]  Aldona Lento 04/08/2015, 3:35 PM  [place] [address] [place], [place], [address] Phone: [phone]   Fax:  [phone]

## 2015-04-10 ENCOUNTER — Ambulatory Visit (HOSPITAL_COMMUNITY): Payer: Commercial Managed Care - HMO | Admitting: Physical Therapy

## 2015-04-10 DIAGNOSIS — Z96652 Presence of left artificial knee joint: Secondary | ICD-10-CM | POA: Diagnosis not present

## 2015-04-10 DIAGNOSIS — M25662 Stiffness of left knee, not elsewhere classified: Secondary | ICD-10-CM

## 2015-04-10 DIAGNOSIS — Z658 Other specified problems related to psychosocial circumstances: Secondary | ICD-10-CM | POA: Diagnosis not present

## 2015-04-10 DIAGNOSIS — Z7409 Other reduced mobility: Secondary | ICD-10-CM | POA: Diagnosis not present

## 2015-04-10 DIAGNOSIS — R609 Edema, unspecified: Secondary | ICD-10-CM | POA: Diagnosis not present

## 2015-04-10 DIAGNOSIS — R269 Unspecified abnormalities of gait and mobility: Secondary | ICD-10-CM | POA: Diagnosis not present

## 2015-04-10 DIAGNOSIS — R6889 Other general symptoms and signs: Secondary | ICD-10-CM

## 2015-04-10 DIAGNOSIS — M25562 Pain in left knee: Secondary | ICD-10-CM | POA: Diagnosis not present

## 2015-04-10 DIAGNOSIS — Z789 Other specified health status: Secondary | ICD-10-CM

## 2015-04-10 NOTE — Therapy (Signed)
[place], [place], [address] Phone: [phone]   Fax:  [phone]  Physical Therapy Treatment  Patient Details  Name: Colleen Lee MRN: [id number] Date of Birth: 18-Dec-1955 Referring Provider:  Rosita Fire, MD  Encounter Date: 04/10/2015      PT End of Session - 04/10/15 1608    Visit Number 15   Number of Visits 25   Date for PT Re-Evaluation 05/08/15   Authorization Type [employer]  (G-codes done 2022/11/06 session)   Authorization Time Period 04/10/15-06/10/15   Authorization - Visit Number 15   Authorization - Number of Visits 25   PT Start Time 3220   PT Stop Time 1600   PT Time Calculation (min) 45 min   Activity Tolerance Patient tolerated treatment well   Behavior During Therapy [facility] for tasks assessed/performed      Past Medical History  Diagnosis Date  . Hypertension   . Fibroids   . [name]     HCV RNA positive 09/2012  . GERD (gastroesophageal reflux disease)   . H. pylori infection 2014    treated with pylera  . Asthma   . PONV (postoperative nausea and vomiting)     pt thinks maybe once she had N&V  . Cirrhosis     Metavir score F4 on elastography 2015  . Fibromyalgia   . Chronic back pain   . Sciatica of left side     Past Surgical History  Procedure Laterality Date  . Knee surgery      left knee  . Colonoscopy  01/18/2008    URK:YHCWCB rectum.  Long redundant colon, a diminutive sigmoid polyp status post cold biopsy removed. Hyperplastic polyp. Repeat colonoscopy June 2014 due to family history of colon cancer  . Esophagogastroduodenoscopy  01/18/2008    RMR: Normal esophagus, normal  stomach  . Colonoscopy with esophagogastroduodenoscopy (egd) N/A 11/02/2012    JSE:GBTDVVOH gastric mucosa of doubtful, +H.pylori. Incomplete colonoscopy due to patient unable to tolerate exam, proximal colon seen. Patient refused ACBE.  . Colonoscopy with propofol N/A 03/08/2013    Dr. Gala Romney:  colonic polyp-removed as scribed above. Internal Hemorrhoids. Pathology did not reveal any colonic tissue, only mucus. SURVEILLANCE DUE Aug 2019  . Polypectomy N/A 03/08/2013    Procedure: POLYPECTOMY;  Surgeon: Daneil Dolin, MD;  Location: [facility];  Service: Endoscopy;  Laterality: N/A;  . Multiple extractions with alveoloplasty N/A 10/15/2013    Procedure: MULTIPLE EXTRACTION WITH ALVEOLOPLASTY;  Surgeon: Gae Bon, DDS;  Location: [facility];  Service: Oral Surgery;  Laterality: N/A;  . Total knee arthroplasty Left 02/05/2015    Procedure: LEFT TOTAL KNEE ARTHROPLASTY;  Surgeon: Carole Civil, MD;  Location: [facility];  Service: Orthopedics;  Laterality: Left;    There were no vitals filed for this visit.  Visit Diagnosis:  Knee stiffness, left  Status post total left knee replacement  Left knee pain  Impaired functional mobility and activity tolerance  Abnormality of gait  Difficulty navigating stairs      Subjective Assessment - 04/10/15 1521    Subjective Pt denies having any lightheadedness or dizziness today. She reports that her knee pain is a 4/10 today. She is still having difficulty with going up stairs and has continued to experience swelling. She feels that her walking has improved, her strength has improved, and her pain levels have decreased.    How long can you stand comfortably? 10 minutes   How long  can you walk comfortably? 15 minutes   Currently in Pain? No/denies   Pain Score 4    Pain Location Knee            [facility] PT Assessment - 04/10/15 0001    AROM   Left Knee Extension -2  was -8   Left Knee Flexion 111  was 92   Strength   Right Hip Flexion 5/5  was 4+/5    Right Hip ABduction 5/5   Left Hip Flexion 5/5  was 2+   Left Hip Extension 5/5   Left Hip ABduction 5/5  was 3-/5   Right Knee Flexion 5/5   Right Knee Extension 5/5  was 4/5    Left Knee Flexion 5/5  was 2+   Left Knee Extension 5/5  was 3-/5    Right Ankle Dorsiflexion 5/5    Left Ankle Dorsiflexion 5/5  was 4/5                     [facility] Adult PT Treatment/Exercise - 04/10/15 0001    Ambulation/Gait   Stairs Yes   Stairs Assistance 6: Modified independent (Device/Increase time)   Stair Management Technique One rail Left;Alternating pattern   Number of Stairs 12  4 7" step x 3 RT   Knee/Hip Exercises: Stretches   Active Hamstring Stretch Left;3 reps;30 seconds   Active Hamstring Stretch Limitations 12" step   Knee: Self-Stretch to increase Flexion Left;10 seconds  10 reps   Gastroc Stretch Both;3 reps;30 seconds   Gastroc Stretch Limitations slant board    Knee/Hip Exercises: Aerobic   Stationary Bike seat 8 x 10'   Knee/Hip Exercises: Standing   Forward Lunges 15 reps   Forward Lunges Limitations 4" box   Side Lunges 10 reps   Side Lunges Limitations 4" box   Forward Step Up Both;1 set;10 reps;Step Height: 6"   Step Down Both;1 set;10 reps;Step Height: 6"                PT Education - 04/10/15 1552    Education provided Yes   Education Details Goals and progress reviewed   Person(s) Educated Patient   Methods Explanation   Comprehension Verbalized understanding          PT Short Term Goals - 04/10/15 1550    PT SHORT TERM GOAL #1   Title Patient will demonstrate L knee ROM of 0-100 degrees    Time 2   Period Weeks   Status Achieved   PT SHORT TERM GOAL #2   Title Patient to demonstrate L LE strength of at least 4-/5 and proximal muscle strength of at least 4-/5    Time 2   Period Weeks   Status Achieved   PT SHORT TERM GOAL #3   Title Patient will demonstrate the ability to ambulate unlimited distances with SPC and pain no more than 2/10 L knee    Time 2   Period Weeks   Status Partially Met   PT SHORT TERM GOAL #4   Title Patient will be independent in correctly and consistently performing HEP to be updated PRN    Time 2   Period Weeks   Status Achieved           PT Long Term Goals - 04/10/15  1550    PT LONG TERM GOAL #1   Title Patient will demonstrate L knee ROM of 0 degrees extension to at least 120 degrees flexion    Time  4   Period Weeks   Status On-going   PT LONG TERM GOAL #2   Title Patient will demonstrate bilateral LE strength of at least 4+/5 and proximal muscle strength at least 4/5    Time 4   Period Weeks   Status Achieved   PT LONG TERM GOAL #3   Title Patient will be able to ambulatue unlimited distances over even and uneven surfaces without assistive device and pain 0/10 L knee    Time 4   Period Weeks   Status On-going   PT LONG TERM GOAL #4   Title Patient will be able to ascend and descend full flight of stairs with no railings, no circumduction, and no unsteadiness throughout, pain L LE 0/10   Time 4   Period Weeks   Status On-going               Plan - 05/10/2015 1552    Clinical Impression Statement Reassessment was completed today. Pt has made steady progress regarding ROM of her left knee, and has achieved 2-111 degrees of motion in the knee. Pt continues to demonstrate impairments in gait mechanics and navigating stairs, and she is unable to ambulate community distances at this time due to pain. She will benefit from continued physical therapy services in order to further address her gait mechanics, functional mobility, and ROM to return pt to PLOF and enable her to achieve LTGs. Pt has missed the past 2 weeks of therapy due to having a stomach virus that caused her to visit the ED, which has hindered her progress with PT. Pt demonstrates good compliance with HEP and good motivation to reach goals set for her. PT is recommending continuing with skilled services 3x per week x 3-4 more weeks. Treatment will focus on decreasing swelling in L knee, improving ROM, improving gait mechanics, and improving functional activity tolerance.    Pt will benefit from skilled therapeutic intervention in order to improve on the following deficits Abnormal  gait;Decreased coordination;Difficulty walking;Decreased endurance;Decreased activity tolerance;Decreased skin integrity;Pain;Decreased balance;Hypomobility;Decreased scar mobility;Decreased mobility;Decreased strength;Postural dysfunction   Rehab Potential Good   PT Frequency 3x / week   PT Duration 4 weeks   PT Treatment/Interventions ADLs/Self Care Home Management;Gait training;Stair training;Therapeutic activities;Therapeutic exercise;Balance training;Patient/family education;Manual techniques   PT Next Visit Plan Continue with ROM activities, manual therapy to improve ROM          G-Codes - 05/10/15 1607    Functional Assessment Tool Used Based on skilled clinical assessment of ROM, strength, pain, gait, functional mobility, stairs   Functional Limitation Mobility: Walking and moving around   Mobility: Walking and Moving Around Current Status (P1031) At least 40 percent but less than 60 percent impaired, limited or restricted   Mobility: Walking and Moving Around Goal Status [id number]) At least 20 percent but less than 40 percent impaired, limited or restricted      Problem List Patient Active Problem List   Diagnosis Date Noted  . Arthritis of knee, degenerative 02/05/2015  . History of Helicobacter pylori infection 10/30/2014  . Chronic [name] with cirrhosis 04/11/2014  . De Quervain's disease (radial styloid tenosynovitis) 12/25/2013  . Anorexia 11/21/2012  . FH: colon cancer 11/21/2012  . Early satiety 10/25/2012  . Bowel habit changes 10/25/2012  . Abdominal pain, epigastric 10/25/2012  . Abdominal bloating 10/25/2012  . Constipation 10/25/2012  . Abnormal weight loss 10/25/2012  . Chronic viral [name] 10/25/2012  . Radicular pain of left lower extremity 09/28/2012  .  Back pain 09/28/2012  . Sciatica 08/10/2011  . S/P arthroscopy of left knee 08/10/2011  . Tibial plateau fracture 08/10/2011  . Pain in joint, lower leg 02/12/2011  . Stiffness of joint, not  elsewhere classified, lower leg 02/12/2011  . 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 Meniscus, medial, derangement 12/29/2010  . CLOSED FRACTURE OF UPPER END OF TIBIA 08/12/2010    Hilma Favors, PT, DPT [phone] 04/10/2015, 4:15 PM  [place] [address] [place], [place], [address] Phone: [phone]   Fax:  [phone]

## 2015-04-11 ENCOUNTER — Other Ambulatory Visit (HOSPITAL_COMMUNITY): Payer: Self-pay | Admitting: Internal Medicine

## 2015-04-11 DIAGNOSIS — J40 Bronchitis, not specified as acute or chronic: Secondary | ICD-10-CM | POA: Diagnosis not present

## 2015-04-11 DIAGNOSIS — I10 Essential (primary) hypertension: Secondary | ICD-10-CM | POA: Diagnosis not present

## 2015-04-11 DIAGNOSIS — F17200 Nicotine dependence, unspecified, uncomplicated: Secondary | ICD-10-CM | POA: Diagnosis not present

## 2015-04-11 DIAGNOSIS — K219 Gastro-esophageal reflux disease without esophagitis: Secondary | ICD-10-CM | POA: Diagnosis not present

## 2015-04-11 DIAGNOSIS — Z1231 Encounter for screening mammogram for malignant neoplasm of breast: Secondary | ICD-10-CM

## 2015-04-14 ENCOUNTER — Ambulatory Visit (HOSPITAL_COMMUNITY): Payer: Commercial Managed Care - HMO | Admitting: Physical Therapy

## 2015-04-14 DIAGNOSIS — Z7409 Other reduced mobility: Secondary | ICD-10-CM | POA: Diagnosis not present

## 2015-04-14 DIAGNOSIS — R609 Edema, unspecified: Secondary | ICD-10-CM | POA: Diagnosis not present

## 2015-04-14 DIAGNOSIS — Z96652 Presence of left artificial knee joint: Secondary | ICD-10-CM | POA: Diagnosis not present

## 2015-04-14 DIAGNOSIS — R269 Unspecified abnormalities of gait and mobility: Secondary | ICD-10-CM

## 2015-04-14 DIAGNOSIS — Z658 Other specified problems related to psychosocial circumstances: Secondary | ICD-10-CM | POA: Diagnosis not present

## 2015-04-14 DIAGNOSIS — M25662 Stiffness of left knee, not elsewhere classified: Secondary | ICD-10-CM | POA: Diagnosis not present

## 2015-04-14 DIAGNOSIS — M25562 Pain in left knee: Secondary | ICD-10-CM

## 2015-04-14 NOTE — Therapy (Signed)
[facility] [address] [place], [place], [address] Phone: [phone]   Fax:  [phone]  Physical Therapy Treatment  Patient Details  Name: Colleen Lee MRN: [id number] Date of Birth: Feb 05, 1956 Referring Provider:  Avon Gully, MD  Encounter Date: 04/14/2015      PT End of Session - 04/14/15 1608    Visit Number 16   Number of Visits 25   Authorization Type [employer]  (G-codes done 15th session)   Authorization Time Period 04/10/15-06/10/15   Authorization - Visit Number 15   Authorization - Number of Visits 25   PT Start Time 1515   PT Stop Time 1602   PT Time Calculation (min) 47 min   Activity Tolerance Patient tolerated treatment well   Behavior During Therapy [facility] for tasks assessed/performed      Past Medical History  Diagnosis Date  . Hypertension   . Fibroids   . [name]     HCV RNA positive 09/2012  . GERD (gastroesophageal reflux disease)   . H. pylori infection 2014    treated with pylera  . Asthma   . PONV (postoperative nausea and vomiting)     pt thinks maybe once she had N&V  . Cirrhosis     Metavir score F4 on elastography 2015  . Fibromyalgia   . Chronic back pain   . Sciatica of left side     Past Surgical History  Procedure Laterality Date  . Knee surgery      left knee  . Colonoscopy  01/18/2008    DCP:KDQTCY rectum.  Long redundant colon, a diminutive sigmoid polyp status post cold biopsy removed. Hyperplastic polyp. Repeat colonoscopy June 2014 due to family history of colon cancer  . Esophagogastroduodenoscopy  01/18/2008    RMR: Normal esophagus, normal  stomach  . Colonoscopy with esophagogastroduodenoscopy (egd) N/A 11/02/2012    DTF:MYNCDDBJ gastric mucosa of doubtful, +H.pylori. Incomplete colonoscopy due to patient unable to tolerate exam, proximal colon seen. Patient refused ACBE.  . Colonoscopy with propofol N/A 03/08/2013    Dr. Jena Gauss: colonic polyp-removed as scribed  above. Internal Hemorrhoids. Pathology did not reveal any colonic tissue, only mucus. SURVEILLANCE DUE Aug 2019  . Polypectomy N/A 03/08/2013    Procedure: POLYPECTOMY;  Surgeon: Corbin Ade, MD;  Location: [facility];  Service: Endoscopy;  Laterality: N/A;  . Multiple extractions with alveoloplasty N/A 10/15/2013    Procedure: MULTIPLE EXTRACTION WITH ALVEOLOPLASTY;  Surgeon: Georgia Lopes, DDS;  Location: [facility];  Service: Oral Surgery;  Laterality: N/A;  . Total knee arthroplasty Left 02/05/2015    Procedure: LEFT TOTAL KNEE ARTHROPLASTY;  Surgeon: Vickki Hearing, MD;  Location: [facility];  Service: Orthopedics;  Laterality: Left;    There were no vitals filed for this visit.  Visit Diagnosis:  Knee stiffness, left  Status post total left knee replacement  Left knee pain  Impaired functional mobility and activity tolerance  Abnormality of gait      Subjective Assessment - 04/14/15 1523    Subjective Pt reports that she has some increased pain in her knee today. She iced it a lot over the weekend, but she is still having more pain than normal today.    Pain Score 6    Pain Location Knee   Pain Orientation Left                [facility] Adult PT Treatment/Exercise - 04/14/15 0001    Knee/Hip Exercises: Stretches  Active Hamstring Stretch Left;3 reps;30 seconds   Active Hamstring Stretch Limitations 12" step   Knee: Self-Stretch to increase Flexion Left;10 seconds  10 reps   Gastroc Stretch Both;3 reps;30 seconds   Gastroc Stretch Limitations slant board    Knee/Hip Exercises: Aerobic   Stationary Bike seat 8 x 10'   Knee/Hip Exercises: Standing   Forward Lunges 15 reps   Forward Lunges Limitations 4" box   Side Lunges 10 reps   Side Lunges Limitations 4" box   Rocker Board 2 minutes   Knee/Hip Exercises: Supine   Quad Sets 10 reps  3 second hold   Heel Slides 15 reps   Manual Therapy   Manual Therapy Joint mobilization;Soft tissue mobilization   Joint Mobilization  Grade III-IV mobilizatios to tibiofemoral joint to improve flexion and extension   Soft tissue mobilization scar tissue mobilization to decrease adhesions                  PT Short Term Goals - 04/10/15 1550    PT SHORT TERM GOAL #1   Title Patient will demonstrate L knee ROM of 0-100 degrees    Time 2   Period Weeks   Status Achieved   PT SHORT TERM GOAL #2   Title Patient to demonstrate L LE strength of at least 4-/5 and proximal muscle strength of at least 4-/5    Time 2   Period Weeks   Status Achieved   PT SHORT TERM GOAL #3   Title Patient will demonstrate the ability to ambulate unlimited distances with SPC and pain no more than 2/10 L knee    Time 2   Period Weeks   Status Partially Met   PT SHORT TERM GOAL #4   Title Patient will be independent in correctly and consistently performing HEP to be updated PRN    Time 2   Period Weeks   Status Achieved           PT Long Term Goals - 04/10/15 1550    PT LONG TERM GOAL #1   Title Patient will demonstrate L knee ROM of 0 degrees extension to at least 120 degrees flexion    Time 4   Period Weeks   Status On-going   PT LONG TERM GOAL #2   Title Patient will demonstrate bilateral LE strength of at least 4+/5 and proximal muscle strength at least 4/5    Time 4   Period Weeks   Status Achieved   PT LONG TERM GOAL #3   Title Patient will be able to ambulatue unlimited distances over even and uneven surfaces without assistive device and pain 0/10 L knee    Time 4   Period Weeks   Status On-going   PT LONG TERM GOAL #4   Title Patient will be able to ascend and descend full flight of stairs with no railings, no circumduction, and no unsteadiness throughout, pain L LE 0/10   Time 4   Period Weeks   Status On-going               Plan - 04/14/15 1609    Clinical Impression Statement Pt presented to PT with increased pain levels today. Treatment session focused on improving ROM and weightbearing on LLE.  Pt responded well to manual therapy, reporting decreased pain following A/P and P/A tibiofemoral joint mobs, patellar mobs, and scar tissue mobilization.    PT Next Visit Plan Continue with ROM activites, reintroduce balance training.  Problem List Patient Active Problem List   Diagnosis Date Noted  . Arthritis of knee, degenerative 02/05/2015  . History of Helicobacter pylori infection 10/30/2014  . Chronic [name] with cirrhosis 04/11/2014  . De Quervain's disease (radial styloid tenosynovitis) 12/25/2013  . Anorexia 11/21/2012  . FH: colon cancer 11/21/2012  . Early satiety 10/25/2012  . Bowel habit changes 10/25/2012  . Abdominal pain, epigastric 10/25/2012  . Abdominal bloating 10/25/2012  . Constipation 10/25/2012  . Abnormal weight loss 10/25/2012  . Chronic viral [name] 10/25/2012  . Radicular pain of left lower extremity 09/28/2012  . Back pain 09/28/2012  . Sciatica 08/10/2011  . S/P arthroscopy of left knee 08/10/2011  . Tibial plateau fracture 08/10/2011  . Pain in joint, lower leg 02/12/2011  . Stiffness of joint, not elsewhere classified, lower leg 02/12/2011  . 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 Meniscus, medial, derangement 12/29/2010  . CLOSED FRACTURE OF UPPER END OF TIBIA 08/12/2010    Hilma Favors, PT, DPT [phone] 04/14/2015, 4:11 PM  [place] [address] [place], [place], [address] Phone: [phone]   Fax:  [phone]

## 2015-04-16 ENCOUNTER — Telehealth (HOSPITAL_COMMUNITY): Payer: Self-pay | Admitting: Physical Therapy

## 2015-04-16 ENCOUNTER — Telehealth: Payer: Self-pay | Admitting: Orthopedic Surgery

## 2015-04-16 ENCOUNTER — Encounter (HOSPITAL_COMMUNITY): Payer: Commercial Managed Care - HMO | Admitting: Physical Therapy

## 2015-04-16 NOTE — Telephone Encounter (Signed)
She is in [place] today

## 2015-04-16 NOTE — Telephone Encounter (Signed)
NOT DUE UNTIL NEXT WEEK, PATIENT AWARE

## 2015-04-16 NOTE — Telephone Encounter (Signed)
Call received from patient to request refill on medication: oxyCODONE-acetaminophen (PERCOCET) 5-325 MG per tablet [[id number]]  [name] next scheduled appointment is 04/28/15. [phone]

## 2015-04-18 ENCOUNTER — Ambulatory Visit (HOSPITAL_COMMUNITY)
Admission: RE | Admit: 2015-04-18 | Discharge: 2015-04-18 | Disposition: A | Discharge status: Home / Self Care | Payer: Commercial Managed Care - HMO | Source: Ambulatory Visit | Attending: Internal Medicine | Admitting: Internal Medicine

## 2015-04-18 ENCOUNTER — Ambulatory Visit (HOSPITAL_COMMUNITY): Payer: Commercial Managed Care - HMO

## 2015-04-18 ENCOUNTER — Ambulatory Visit (HOSPITAL_COMMUNITY): Payer: Commercial Managed Care - HMO | Admitting: Physical Therapy

## 2015-04-18 ENCOUNTER — Other Ambulatory Visit (HOSPITAL_COMMUNITY): Payer: Self-pay | Admitting: Internal Medicine

## 2015-04-18 DIAGNOSIS — Z658 Other specified problems related to psychosocial circumstances: Secondary | ICD-10-CM | POA: Diagnosis not present

## 2015-04-18 DIAGNOSIS — M25662 Stiffness of left knee, not elsewhere classified: Secondary | ICD-10-CM

## 2015-04-18 DIAGNOSIS — R609 Edema, unspecified: Secondary | ICD-10-CM | POA: Diagnosis not present

## 2015-04-18 DIAGNOSIS — Z96652 Presence of left artificial knee joint: Secondary | ICD-10-CM

## 2015-04-18 DIAGNOSIS — Z1231 Encounter for screening mammogram for malignant neoplasm of breast: Secondary | ICD-10-CM | POA: Diagnosis not present

## 2015-04-18 DIAGNOSIS — R6889 Other general symptoms and signs: Secondary | ICD-10-CM

## 2015-04-18 DIAGNOSIS — Z7409 Other reduced mobility: Secondary | ICD-10-CM | POA: Diagnosis not present

## 2015-04-18 DIAGNOSIS — R269 Unspecified abnormalities of gait and mobility: Secondary | ICD-10-CM

## 2015-04-18 DIAGNOSIS — Z789 Other specified health status: Secondary | ICD-10-CM

## 2015-04-18 DIAGNOSIS — M25562 Pain in left knee: Secondary | ICD-10-CM | POA: Diagnosis not present

## 2015-04-18 NOTE — Therapy (Signed)
[facility] [place], [place], [address] Phone: [phone]   Fax:  [phone]  Physical Therapy Treatment  Patient Details  Name: Colleen Lee MRN: [id number] Date of Birth: [birth year] Referring Provider:  Rosita Fire, MD  Encounter Date: 04/18/2015      PT End of Session - 04/18/15 1602    Visit Number 17   Number of Visits [place] [employer]  (G-codes done 15th session)   Authorization Time Period 04/10/15-06/10/15   Authorization - Visit Number 36   Authorization - Number of Visits 25   PT Start Time 9233   PT Stop Time 1600   PT Time Calculation (min) 42 min   Activity Tolerance Patient tolerated treatment well   Behavior During Therapy [facility] for tasks assessed/performed      Past Medical History  Diagnosis Date  . Hypertension   . Fibroids   . [name]     HCV RNA positive 09/2012  . GERD (gastroesophageal reflux disease)   . H. pylori infection 2014    treated with pylera  . Asthma   . PONV (postoperative nausea and vomiting)     pt thinks maybe once she had N&V  . Cirrhosis     Metavir score F4 on elastography 2015  . Fibromyalgia   . Chronic back pain   . Sciatica of left side     Past Surgical History  Procedure Laterality Date  . Knee surgery      left knee  . Colonoscopy  01/18/2008    AQT:MAUQJF rectum.  Long redundant colon, a diminutive sigmoid polyp status post cold biopsy removed. Hyperplastic polyp. Repeat colonoscopy June 2014 due to family history of colon cancer  . Esophagogastroduodenoscopy  01/18/2008    RMR: Normal esophagus, normal  stomach  . Colonoscopy with esophagogastroduodenoscopy (egd) N/A 11/02/2012    HLK:[id number] gastric mucosa of doubtful, +H.pylori. Incomplete colonoscopy due to patient unable to tolerate exam, proximal colon seen. Patient refused ACBE.  . Colonoscopy with propofol N/A 03/08/2013    Dr. Gala Romney: colonic polyp-removed as scribed  above. Internal Hemorrhoids. Pathology did not reveal any colonic tissue, only mucus. SURVEILLANCE DUE Aug 2019  . Polypectomy N/A 03/08/2013    Procedure: POLYPECTOMY;  Surgeon: Daneil Dolin, MD;  Location: [facility];  Service: Endoscopy;  Laterality: N/A;  . Multiple extractions with alveoloplasty N/A 10/15/2013    Procedure: MULTIPLE EXTRACTION WITH ALVEOLOPLASTY;  Surgeon: Gae Bon, DDS;  Location: [facility];  Service: Oral Surgery;  Laterality: N/A;  . Total knee arthroplasty Left 02/05/2015    Procedure: LEFT TOTAL KNEE ARTHROPLASTY;  Surgeon: Carole Civil, MD;  Location: [facility];  Service: Orthopedics;  Laterality: Left;    There were no vitals filed for this visit.  Visit Diagnosis:  Knee stiffness, left  Status post total left knee replacement  Left knee pain  Impaired functional mobility and activity tolerance  Abnormality of gait  Difficulty navigating stairs  Edema      Subjective Assessment - 04/18/15 1605    Subjective Pt states her pain remains high in her Lt knee.  STates she has been doing her HEP.   Currently in Pain? Yes   Pain Score 6    Pain Location Knee   Pain Orientation Left                         [facility] Adult PT Treatment/Exercise - 04/18/15  1520    Knee/Hip Exercises: Stretches   Active Hamstring Stretch Left;3 reps;30 seconds   Active Hamstring Stretch Limitations 12" step   Knee: Self-Stretch to increase Flexion Left;10 seconds   Knee: Self-Stretch Limitations 12" step   Gastroc Stretch Both;3 reps;30 seconds   Gastroc Stretch Limitations slant board    Knee/Hip Exercises: Aerobic   Stationary Bike seat 7 x 6   Knee/Hip Exercises: Standing   Forward Lunges 15 reps   Forward Lunges Limitations 4" box   Side Lunges 10 reps   Side Lunges Limitations 4" box   Lateral Step Up Left;10 reps;Step Height: 4";Hand Hold: 1   Lateral Step Up Limitations 4 inch step   Forward Step Up Both;1 set;10 reps;Step Height: 6"   Forward  Step Up Limitations 4 inch box    Step Down Both;1 set;10 reps;Step Height: 6"   Step Down Limitations 2 inch step    Rocker Board 2 minutes   Knee/Hip Exercises: Supine   Quad Sets 10 reps   Heel Slides 15 reps   Knee Extension PROM   Knee Extension Limitations -3   Knee Flexion PROM   Knee Flexion Limitations 110   Manual Therapy   Manual Therapy Myofascial release   Myofascial Release Lt knee and scar tissue massage                  PT Short Term Goals - 04/10/15 1550    PT SHORT TERM GOAL #1   Title Patient will demonstrate L knee ROM of 0-100 degrees    Time 2   Period Weeks   Status Achieved   PT SHORT TERM GOAL #2   Title Patient to demonstrate L LE strength of at least 4-/5 and proximal muscle strength of at least 4-/5    Time 2   Period Weeks   Status Achieved   PT SHORT TERM GOAL #3   Title Patient will demonstrate the ability to ambulate unlimited distances with SPC and pain no more than 2/10 L knee    Time 2   Period Weeks   Status Partially Met   PT SHORT TERM GOAL #4   Title Patient will be independent in correctly and consistently performing HEP to be updated PRN    Time 2   Period Weeks   Status Achieved           PT Long Term Goals - 04/10/15 1550    PT LONG TERM GOAL #1   Title Patient will demonstrate L knee ROM of 0 degrees extension to at least 120 degrees flexion    Time 4   Period Weeks   Status On-going   PT LONG TERM GOAL #2   Title Patient will demonstrate bilateral LE strength of at least 4+/5 and proximal muscle strength at least 4/5    Time 4   Period Weeks   Status Achieved   PT LONG TERM GOAL #3   Title Patient will be able to ambulatue unlimited distances over even and uneven surfaces without assistive device and pain 0/10 L knee    Time 4   Period Weeks   Status On-going   PT LONG TERM GOAL #4   Title Patient will be able to ascend and descend full flight of stairs with no railings, no circumduction, and no  unsteadiness throughout, pain L LE 0/10   Time 4   Period Weeks   Status On-going  Plan - 04/18/15 1602    Clinical Impression Statement Pt remains with higher than optimal pain levels and noted antalgia at times.  Continued focus on improving ROM and functional strength in Lt LE.  Continued with scar massage and MFR techniques to Lt knee to decrease adhesions and improve ROM.  Pt with -3 to 110 degress AROM in Lt knee today   Able to move seat on bike one level closer today.     PT Next Visit Plan Continue with ROM activites, reintroduce balance training.         Problem List Patient Active Problem List   Diagnosis Date Noted  . Arthritis of knee, degenerative 02/05/2015  . History of Helicobacter pylori infection 10/30/2014  . Chronic [name] with cirrhosis 04/11/2014  . De Quervain's disease (radial styloid tenosynovitis) 12/25/2013  . Anorexia 11/21/2012  . FH: colon cancer 11/21/2012  . Early satiety 10/25/2012  . Bowel habit changes 10/25/2012  . Abdominal pain, epigastric 10/25/2012  . Abdominal bloating 10/25/2012  . Constipation 10/25/2012  . Abnormal weight loss 10/25/2012  . Chronic viral [name] 10/25/2012  . Radicular pain of left lower extremity 09/28/2012  . Back pain 09/28/2012  . Sciatica 08/10/2011  . S/P arthroscopy of left knee 08/10/2011  . Tibial plateau fracture 08/10/2011  . Pain in joint, lower leg 02/12/2011  . Stiffness of joint, not elsewhere classified, lower leg 02/12/2011  . 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 Meniscus, medial, derangement 12/29/2010  . CLOSED FRACTURE OF UPPER END OF TIBIA 08/12/2010    Teena Irani, PTA/CLT [phone]  04/18/2015, 4:06 PM  [facility] [address] [place], [place], [address] Phone: [phone]   Fax:  [phone]

## 2015-04-21 ENCOUNTER — Other Ambulatory Visit: Payer: Self-pay | Admitting: *Deleted

## 2015-04-21 ENCOUNTER — Ambulatory Visit (HOSPITAL_COMMUNITY): Payer: Commercial Managed Care - HMO | Admitting: Physical Therapy

## 2015-04-21 DIAGNOSIS — R609 Edema, unspecified: Secondary | ICD-10-CM

## 2015-04-21 DIAGNOSIS — Z658 Other specified problems related to psychosocial circumstances: Secondary | ICD-10-CM | POA: Diagnosis not present

## 2015-04-21 DIAGNOSIS — M25662 Stiffness of left knee, not elsewhere classified: Secondary | ICD-10-CM

## 2015-04-21 DIAGNOSIS — R6889 Other general symptoms and signs: Secondary | ICD-10-CM

## 2015-04-21 DIAGNOSIS — R269 Unspecified abnormalities of gait and mobility: Secondary | ICD-10-CM | POA: Diagnosis not present

## 2015-04-21 DIAGNOSIS — M25562 Pain in left knee: Secondary | ICD-10-CM

## 2015-04-21 DIAGNOSIS — Z7409 Other reduced mobility: Secondary | ICD-10-CM | POA: Diagnosis not present

## 2015-04-21 DIAGNOSIS — Z96652 Presence of left artificial knee joint: Secondary | ICD-10-CM

## 2015-04-21 DIAGNOSIS — Z789 Other specified health status: Secondary | ICD-10-CM

## 2015-04-21 MED ORDER — HYDROCODONE-ACETAMINOPHEN 10-325 MG PO TABS: 1.0000 | ORAL_TABLET | Freq: Four times a day (QID) | ORAL | Status: DC | PRN

## 2015-04-21 NOTE — Therapy (Signed)
[facility] [place], [place], [address] Phone: [phone]   Fax:  [phone]  Physical Therapy Treatment  Patient Details  Name: Colleen Lee MRN: [id number] Date of Birth: 1955-08-08 Referring Provider:  Rosita Fire, MD  Encounter Date: 04/21/2015      PT End of Session - 04/21/15 1552    Visit Number 18   Number of Visits [place] [employer]  (G-codes done 15th session)   Authorization Time Period 04/10/15-06/10/15   Authorization - Visit Number 18   Authorization - Number of Visits 25   PT Start Time 8242   PT Stop Time 1558   PT Time Calculation (min) 43 min   Activity Tolerance Patient tolerated treatment well   Behavior During Therapy [facility] for tasks assessed/performed      Past Medical History  Diagnosis Date  . Hypertension   . Fibroids   . [name]     HCV RNA positive 09/2012  . GERD (gastroesophageal reflux disease)   . H. pylori infection 2014    treated with pylera  . Asthma   . PONV (postoperative nausea and vomiting)     pt thinks maybe once she had N&V  . Cirrhosis     Metavir score F4 on elastography 2015  . Fibromyalgia   . Chronic back pain   . Sciatica of left side     Past Surgical History  Procedure Laterality Date  . Knee surgery      left knee  . Colonoscopy  01/18/2008    PNT:IRWERX rectum.  Long redundant colon, a diminutive sigmoid polyp status post cold biopsy removed. Hyperplastic polyp. Repeat colonoscopy June 2014 due to family history of colon cancer  . Esophagogastroduodenoscopy  01/18/2008    RMR: Normal esophagus, normal  stomach  . Colonoscopy with esophagogastroduodenoscopy (egd) N/A 11/02/2012    VQM:GQQPYPPJ gastric mucosa of doubtful, +H.pylori. Incomplete colonoscopy due to patient unable to tolerate exam, proximal colon seen. Patient refused ACBE.  . Colonoscopy with propofol N/A 03/08/2013    Dr. Gala Romney: colonic polyp-removed as scribed  above. Internal Hemorrhoids. Pathology did not reveal any colonic tissue, only mucus. SURVEILLANCE DUE Aug 2019  . Polypectomy N/A 03/08/2013    Procedure: POLYPECTOMY;  Surgeon: Daneil Dolin, MD;  Location: [facility];  Service: Endoscopy;  Laterality: N/A;  . Multiple extractions with alveoloplasty N/A 10/15/2013    Procedure: MULTIPLE EXTRACTION WITH ALVEOLOPLASTY;  Surgeon: Gae Bon, DDS;  Location: [facility];  Service: Oral Surgery;  Laterality: N/A;  . Total knee arthroplasty Left 02/05/2015    Procedure: LEFT TOTAL KNEE ARTHROPLASTY;  Surgeon: Carole Civil, MD;  Location: [facility];  Service: Orthopedics;  Laterality: Left;    There were no vitals filed for this visit.  Visit Diagnosis:  Knee stiffness, left  Status post total left knee replacement  Left knee pain  Impaired functional mobility and activity tolerance  Abnormality of gait  Difficulty navigating stairs  Edema      Subjective Assessment - 04/21/15 1600    Subjective PT states she is doing OK today but still having the pain and stiffness in her LT knee.    Currently in Pain? Yes   Pain Score 5    Pain Location Knee   Pain Orientation Left                         [facility] Adult PT Treatment/Exercise -  04/21/15 1532    Knee/Hip Exercises: Stretches   Active Hamstring Stretch Left;3 reps;30 seconds   Active Hamstring Stretch Limitations 12" step   Knee: Self-Stretch to increase Flexion Left;10 seconds   Knee: Self-Stretch Limitations 12" step   Gastroc Stretch Both;3 reps;30 seconds   Gastroc Stretch Limitations slant board    Knee/Hip Exercises: Aerobic   Stationary Bike seat 8 x 10 minutes   Knee/Hip Exercises: Standing   Heel Raises 15 reps   Forward Lunges 15 reps   Forward Lunges Limitations 2" box   Side Lunges 15 reps   Side Lunges Limitations 2" box   Lateral Step Up Left;Step Height: 4";Hand Hold: 1;15 reps   Lateral Step Up Limitations 4 inch step and airex   Forward Step Up  Both;1 set;Step Height: 6";15 reps   Forward Step Up Limitations 4 inch box and airex   Step Down Both;1 set;15 reps;Step Height: 4"   Step Down Limitations 4 inch step    Rocker Board 2 minutes   Rocker Board Limitations Rt:Lt                  PT Short Term Goals - 04/10/15 1550    PT SHORT TERM GOAL #1   Title Patient will demonstrate L knee ROM of 0-100 degrees    Time 2   Period Weeks   Status Achieved   PT SHORT TERM GOAL #2   Title Patient to demonstrate L LE strength of at least 4-/5 and proximal muscle strength of at least 4-/5    Time 2   Period Weeks   Status Achieved   PT SHORT TERM GOAL #3   Title Patient will demonstrate the ability to ambulate unlimited distances with SPC and pain no more than 2/10 L knee    Time 2   Period Weeks   Status Partially Met   PT SHORT TERM GOAL #4   Title Patient will be independent in correctly and consistently performing HEP to be updated PRN    Time 2   Period Weeks   Status Achieved           PT Long Term Goals - 04/10/15 1550    PT LONG TERM GOAL #1   Title Patient will demonstrate L knee ROM of 0 degrees extension to at least 120 degrees flexion    Time 4   Period Weeks   Status On-going   PT LONG TERM GOAL #2   Title Patient will demonstrate bilateral LE strength of at least 4+/5 and proximal muscle strength at least 4/5    Time 4   Period Weeks   Status Achieved   PT LONG TERM GOAL #3   Title Patient will be able to ambulatue unlimited distances over even and uneven surfaces without assistive device and pain 0/10 L knee    Time 4   Period Weeks   Status On-going   PT LONG TERM GOAL #4   Title Patient will be able to ascend and descend full flight of stairs with no railings, no circumduction, and no unsteadiness throughout, pain L LE 0/10   Time 4   Period Weeks   Status On-going               Plan - 04/21/15 1556    Clinical Impression Statement Able to progress lunges to 2" riser and step  ups using airex on top of 4" step.  Pt able to complete all exericses today wtihout difficulty.  Scar  massage not completed today as patient has been educated wtih self massage for HEP.     PT Next Visit Plan Continue with ROM activites, reintroduce balance training.         Problem List Patient Active Problem List   Diagnosis Date Noted  . Arthritis of knee, degenerative 02/05/2015  . History of Helicobacter pylori infection 10/30/2014  . Chronic [name] with cirrhosis 04/11/2014  . De Quervain's disease (radial styloid tenosynovitis) 12/25/2013  . Anorexia 11/21/2012  . FH: colon cancer 11/21/2012  . Early satiety 10/25/2012  . Bowel habit changes 10/25/2012  . Abdominal pain, epigastric 10/25/2012  . Abdominal bloating 10/25/2012  . Constipation 10/25/2012  . Abnormal weight loss 10/25/2012  . Chronic viral [name] 10/25/2012  . Radicular pain of left lower extremity 09/28/2012  . Back pain 09/28/2012  . Sciatica 08/10/2011  . S/P arthroscopy of left knee 08/10/2011  . Tibial plateau fracture 08/10/2011  . Pain in joint, lower leg 02/12/2011  . Stiffness of joint, not elsewhere classified, lower leg 02/12/2011  . 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 Meniscus, medial, derangement 12/29/2010  . CLOSED FRACTURE OF UPPER END OF TIBIA 08/12/2010    Teena Irani, PTA/CLT [phone]  04/21/2015, 4:01 PM  [place], [place], [address] Phone: [phone]   Fax:  [phone]

## 2015-04-21 NOTE — Telephone Encounter (Signed)
Patient called back this morning, 04/21/15; states that she is now due for refill of pain medication. * The pain medication listed as last being refilled by Dr Aline Brochure 04/03/15 is  Hydrocodone acetaminophen (NORCO) 10-325 MG per tablet [[id number]]; there is also medication OxyCODONE-acetaminophen (Percocet)5-325 on medication list from 04/01/15. [name] ph# [phone]

## 2015-04-21 NOTE — Telephone Encounter (Signed)
Patient picked up Rx

## 2015-04-21 NOTE — Telephone Encounter (Signed)
Prescription available, patient aware  

## 2015-04-22 ENCOUNTER — Ambulatory Visit (INDEPENDENT_AMBULATORY_CARE_PROVIDER_SITE_OTHER): Payer: Commercial Managed Care - HMO | Admitting: Gastroenterology

## 2015-04-22 ENCOUNTER — Encounter: Payer: Self-pay | Admitting: Gastroenterology

## 2015-04-22 VITALS — BP 152/85 | HR 92 | Temp 98.0°F | Ht 66.0 in | Wt 167.0 lb

## 2015-04-22 DIAGNOSIS — B182 Chronic viral hepatitis C: Secondary | ICD-10-CM | POA: Diagnosis not present

## 2015-04-22 DIAGNOSIS — K7460 Unspecified cirrhosis of liver: Principal | ICD-10-CM

## 2015-04-22 NOTE — Progress Notes (Signed)
cc'ed to pcp °

## 2015-04-22 NOTE — Patient Instructions (Signed)
We will see you back in February 2017!  Have a wonderful rest of the year!

## 2015-04-22 NOTE — Progress Notes (Signed)
Referring Provider: Avon Gully, MD Primary Care Physician:  Avon Gully, MD  Primary GI: Dr. Jena Gauss   Chief Complaint  Patient presents with  . Follow-up    HPI:   Colleen Lee is a 59 y.o. female presenting today with a history of Hep C genotype 1 a s/p treatment with Harvoni and achieving SVR. Routine follow-up for cirrhosis.   Recently had knee replacement. Some constipation with narcotics after surgery but takes stool softener with good results.  No abdominal pain. Woke up this morning slightly nauseated but otherwise doing well.  CT abd/pelvis Aug 2016 on file. Next imaging in due in Feb 2017.   Past Medical History  Diagnosis Date  . Hypertension   . Fibroids   . [name]     HCV RNA positive 09/2012  . GERD (gastroesophageal reflux disease)   . H. pylori infection 2014    treated with pylera  . Asthma   . PONV (postoperative nausea and vomiting)     pt thinks maybe once she had N&V  . Cirrhosis     Metavir score F4 on elastography 2015  . Fibromyalgia   . Chronic back pain   . Sciatica of left side     Past Surgical History  Procedure Laterality Date  . Knee surgery      left knee  . Colonoscopy  01/18/2008    CNB:IIBUZG rectum.  Long redundant colon, a diminutive sigmoid polyp status post cold biopsy removed. Hyperplastic polyp. Repeat colonoscopy June 2014 due to family history of colon cancer  . Esophagogastroduodenoscopy  01/18/2008    RMR: Normal esophagus, normal  stomach  . Colonoscopy with esophagogastroduodenoscopy (egd) N/A 11/02/2012    KHF:LOZVUMZO gastric mucosa of doubtful, +H.pylori. Incomplete colonoscopy due to patient unable to tolerate exam, proximal colon seen. Patient refused ACBE.  . Colonoscopy with propofol N/A 03/08/2013    Dr. Jena Gauss: colonic polyp-removed as scribed above. Internal Hemorrhoids. Pathology did not reveal any colonic tissue, only mucus. SURVEILLANCE DUE Aug 2019  . Polypectomy N/A 03/08/2013    Procedure:  POLYPECTOMY;  Surgeon: Corbin Ade, MD;  Location: [facility];  Service: Endoscopy;  Laterality: N/A;  . Multiple extractions with alveoloplasty N/A 10/15/2013    Procedure: MULTIPLE EXTRACTION WITH ALVEOLOPLASTY;  Surgeon: Georgia Lopes, DDS;  [facility];  Service: Oral Surgery;  Laterality: N/A;  . Total knee arthroplasty Left 02/05/2015    Procedure: LEFT TOTAL KNEE ARTHROPLASTY;  Surgeon: Vickki Hearing, MD;  Location: [facility];  Service: Orthopedics;  Laterality: Left;    Current Outpatient Prescriptions  Medication Sig Dispense Refill  . amLODipine (NORVASC) 5 MG tablet Take 5 mg by mouth daily.    Marland Kitchen aspirin EC 325 MG EC tablet Take 1 tablet (325 mg total) by mouth 2 (two) times daily. 60 tablet 0  . Cyanocobalamin (VITAMIN B 12 PO) Take 1 tablet by mouth daily.    . cyclobenzaprine (FLEXERIL) 10 MG tablet Take 10 mg by mouth daily.    Marland Kitchen esomeprazole (NEXIUM) 40 MG capsule Take 1 capsule (40 mg total) by mouth every morning. 10 capsule 0  . gabapentin (NEURONTIN) 100 MG capsule Take 1 capsule (100 mg total) by mouth 3 (three) times daily. 90 capsule 0  . HYDROcodone-acetaminophen (NORCO) 10-325 MG per tablet Take 1 tablet by mouth every 6 (six) hours as needed. 90 tablet 0  . ibuprofen (ADVIL,MOTRIN) 800 MG tablet Take 1 tablet (800 mg total) by mouth every 8 (eight) hours as  needed. (Patient taking differently: Take 800 mg by mouth every 8 (eight) hours as needed for mild pain. ) 90 tablet 5  . lisinopril (PRINIVIL,ZESTRIL) 20 MG tablet Take 20 mg by mouth daily.    . megestrol (MEGACE) 40 MG tablet Take 40 mg by mouth daily.    . nicotine (NICODERM CQ - DOSED IN MG/24 HOURS) 21 mg/24hr patch Place 21 mg onto the skin daily.     . ondansetron (ZOFRAN) 8 MG tablet Take 1 tablet (8 mg total) by mouth every 8 (eight) hours as needed. (Patient taking differently: Take 8 mg by mouth every 8 (eight) hours as needed for nausea or vomiting. ) 12 tablet 0  . oxyCODONE-acetaminophen (PERCOCET)  5-325 MG per tablet Take 1 tablet by mouth every 4 (four) hours as needed. 15 tablet 0  . potassium chloride SA (K-DUR,KLOR-CON) 20 MEQ tablet Take 20 mEq by mouth daily.    Marland Kitchen tiZANidine (ZANAFLEX) 2 MG tablet Take 1 tablet (2 mg total) by mouth every 8 (eight) hours as needed. (Patient taking differently: Take 2 mg by mouth every 8 (eight) hours as needed for muscle spasms. ) 60 tablet 0  . rifabutin (MYCOBUTIN) 150 MG capsule Take 2 capsules (300 mg total) by mouth daily. (Patient not taking: Reported on 04/22/2015) 20 capsule 0   No current facility-administered medications for this visit.    Allergies as of 04/22/2015 - Review Complete 04/22/2015  Allergen Reaction Noted  . Penicillins Rash     Family History  Problem Relation Age of Onset  . Colon cancer Brother        . Asthma      FH  . Multiple myeloma Brother   . Liver cancer Sister   . Prostate cancer Brother   . Pancreatic cancer Brother   . Cancer Other     niece, age 32, primary unknown    Social History   Social History  . Marital Status: Married    Spouse Name: N/A  . Number of Children: 0  . Years of Education: N/A   Occupational History  . umemployed     Social History Main Topics  . Smoking status: Current Every Day Smoker -- 1.00 packs/day for 40 years    Types: Cigarettes  . Smokeless tobacco: None     Comment: Smokes one pack of cigarettes daily  . Alcohol Use: No     Comment: former, last Jan 2014  . Drug Use: No     Comment: history of marijuana in past  . Sexual Activity: No   Other Topics Concern  . None   Social History Narrative    Review of Systems: As mentioned in HPI   Physical Exam: BP 152/85 mmHg  Pulse 92  Temp(Src) 98 F (36.7 C) (Oral)  Ht $R'5\' 6"'Kn$  (1.676 m)  Wt 167 lb (75.751 kg)  BMI 26.97 kg/m2 General:   Alert and oriented. No distress noted. Pleasant and cooperative.  Head:  Normocephalic and atraumatic. Eyes:  Conjuctiva clear without scleral icterus. Abdomen:   +BS, soft, non-tender and non-distended. No rebound or guarding. Liver margin palpable just below right costal margin  Msk:  Symmetrical without gross deformities. Normal posture. Extremities:  Without edema. Neurologic:  Alert and  oriented x4;  grossly normal neurologically. Skin:  Intact without significant lesions or rashes. Psych:  Alert and cooperative. Normal mood and affect.

## 2015-04-22 NOTE — Assessment & Plan Note (Signed)
With successful treatment of Hep C with Harvoni, HCV RNA remains undetectable. Next abdominal imaging for [facility] screening in Feb 2017. Consider EGD in 2017 to screen for varices (last in 2014 without evidence of varices). Return in Feb 2017.

## 2015-04-23 ENCOUNTER — Telehealth (HOSPITAL_COMMUNITY): Payer: Self-pay | Admitting: Physical Therapy

## 2015-04-23 ENCOUNTER — Ambulatory Visit (HOSPITAL_COMMUNITY): Payer: Commercial Managed Care - HMO | Admitting: Physical Therapy

## 2015-04-23 NOTE — Telephone Encounter (Signed)
Patient a no show for today's appointment. Called and spoke to the patient, who reported that she was not feeling good today due to blood pressure issues. Reminded patient of time and date of next appointment.  Deniece Ree PT, DPT [phone]

## 2015-04-25 ENCOUNTER — Ambulatory Visit (HOSPITAL_COMMUNITY): Payer: Commercial Managed Care - HMO | Admitting: Physical Therapy

## 2015-04-25 DIAGNOSIS — Z7409 Other reduced mobility: Secondary | ICD-10-CM | POA: Diagnosis not present

## 2015-04-25 DIAGNOSIS — R269 Unspecified abnormalities of gait and mobility: Secondary | ICD-10-CM | POA: Diagnosis not present

## 2015-04-25 DIAGNOSIS — M25662 Stiffness of left knee, not elsewhere classified: Secondary | ICD-10-CM | POA: Diagnosis not present

## 2015-04-25 DIAGNOSIS — Z658 Other specified problems related to psychosocial circumstances: Secondary | ICD-10-CM | POA: Diagnosis not present

## 2015-04-25 DIAGNOSIS — R609 Edema, unspecified: Secondary | ICD-10-CM

## 2015-04-25 DIAGNOSIS — Z96652 Presence of left artificial knee joint: Secondary | ICD-10-CM | POA: Diagnosis not present

## 2015-04-25 DIAGNOSIS — M25562 Pain in left knee: Secondary | ICD-10-CM

## 2015-04-25 DIAGNOSIS — Z789 Other specified health status: Secondary | ICD-10-CM

## 2015-04-25 DIAGNOSIS — R6889 Other general symptoms and signs: Secondary | ICD-10-CM

## 2015-04-25 NOTE — Therapy (Signed)
[name], [place], [address] Phone: [phone]   Fax:  [phone]  Physical Therapy Treatment  Patient Details  Name: Colleen Lee MRN: [id number] Date of Birth: 13-Jun-1956 Referring Provider:  Rosita Fire, MD  Encounter Date: 04/25/2015      PT End of Session - 04/25/15 1558    Visit Number 19   Number of Visits 25   Date for PT Re-Evaluation 05/08/15   Authorization Type [employer]  (G-codes done 11-11-2022 session)   Authorization Time Period 04/10/15-06/10/15   Authorization - Visit Number 61   Authorization - Number of Visits 25   PT Start Time 7989   PT Stop Time 1600   PT Time Calculation (min) 44 min   Activity Tolerance Patient tolerated treatment well      Past Medical History  Diagnosis Date  . Hypertension   . Fibroids   . [name]     HCV RNA positive 09/2012  . GERD (gastroesophageal reflux disease)   . H. pylori infection 2014    treated with pylera  . Asthma   . PONV (postoperative nausea and vomiting)     pt thinks maybe once she had N&V  . Cirrhosis     Metavir score F4 on elastography 2015  . Fibromyalgia   . Chronic back pain   . Sciatica of left side     Past Surgical History  Procedure Laterality Date  . Knee surgery      left knee  . Colonoscopy  01/18/2008    QJJ:HERDEY rectum.  Long redundant colon, a diminutive sigmoid polyp status post cold biopsy removed. Hyperplastic polyp. Repeat colonoscopy June 2014 due to family history of colon cancer  . Esophagogastroduodenoscopy  01/18/2008    RMR: Normal esophagus, normal  stomach  . Colonoscopy with esophagogastroduodenoscopy (egd) N/A 11/02/2012    CXK:GYJEHUDJ gastric mucosa of doubtful, +H.pylori. Incomplete colonoscopy due to patient unable to tolerate exam, proximal colon seen. Patient refused ACBE.  . Colonoscopy with propofol N/A 03/08/2013    Dr. Gala Romney: colonic polyp-removed as scribed above. Internal  Hemorrhoids. Pathology did not reveal any colonic tissue, only mucus. SURVEILLANCE DUE Aug 2019  . Polypectomy N/A 03/08/2013    Procedure: POLYPECTOMY;  Surgeon: Daneil Dolin, MD;  Location: [facility];  Service: Endoscopy;  Laterality: N/A;  . Multiple extractions with alveoloplasty N/A 10/15/2013    Procedure: MULTIPLE EXTRACTION WITH ALVEOLOPLASTY;  Surgeon: Gae Bon, DDS;  Location: [facility];  Service: Oral Surgery;  Laterality: N/A;  . Total knee arthroplasty Left 02/05/2015    Procedure: LEFT TOTAL KNEE ARTHROPLASTY;  Surgeon: Carole Civil, MD;  Location: [facility];  Service: Orthopedics;  Laterality: Left;    There were no vitals filed for this visit.  Visit Diagnosis:  Knee stiffness, left  Status post total left knee replacement  Left knee pain  Impaired functional mobility and activity tolerance  Abnormality of gait  Difficulty navigating stairs  Edema      Subjective Assessment - 04/25/15 1518    Subjective Pt states she is still having sharp pain in her knee cap several times a day without rhyme or reason.    Pertinent History L TKR was performed on Wednesday (July 6th);    How long can you sit comfortably? 15 minutes    Currently in Pain? Yes   Pain Score 5    Pain Location Knee   Pain Orientation Left   Pain Descriptors /  Indicators Aching            [facility] PT Assessment - 04/25/15 0001    AROM   Left Knee Extension 2   Left Knee Flexion 115                     [facility] Adult PT Treatment/Exercise - 04/25/15 0001    Knee/Hip Exercises: Stretches   Active Hamstring Stretch Left;3 reps;30 seconds   Active Hamstring Stretch Limitations supine    Quad Stretch 3 reps;30 seconds   Quad Stretch Limitations prone with rope    Knee: Self-Stretch to increase Flexion Left;10 seconds   Knee: Self-Stretch Limitations 12" step   Gastroc Stretch Both;3 reps;30 seconds   Gastroc Stretch Limitations slant board    Knee/Hip Exercises: Aerobic    Stationary Bike seat 8 x 10 minutes   Knee/Hip Exercises: Standing   Heel Raises 15 reps   Forward Lunges --   Forward Lunges Limitations --   Side Lunges --   Side Lunges Limitations --   Terminal Knee Extension Strengthening;Left;15 reps   Lateral Step Up --   Lateral Step Up Limitations --   Forward Step Up --   Forward Step Up Limitations --   Step Down --   Step Down Limitations --   Stairs 5 RT 7" no hands    Rocker Board --   Rocker Board Limitations --   SLS with Vectors 10" x 3   B   Knee/Hip Exercises: Supine   Quad Sets 15 reps   Heel Slides 10 reps   Knee Extension PROM   Knee Extension Limitations 2   Knee Flexion PROM   Knee Flexion Limitations 115   Knee/Hip Exercises: Prone   Hamstring Curl --  terminal flexion   Contract/Relax to Increase Flexion x5   Other Prone Exercises terminal knee extension 5x5"   Manual Therapy   Manual Therapy Edema management   Edema Management foot to hip to decrease swelling; scar massage to decrease adhesions.                 PT Education - 04/25/15 1558    Education provided Yes   Education Details smoothing out stair climbing    [employer]) Educated Patient   Methods Explanation;Demonstration   Comprehension Verbalized understanding;Returned demonstration          PT Short Term Goals - 04/10/15 1550    PT SHORT TERM GOAL #1   Title Patient will demonstrate L knee ROM of 0-100 degrees    Time 2   Period Weeks   Status Achieved   PT SHORT TERM GOAL #2   Title Patient to demonstrate L LE strength of at least 4-/5 and proximal muscle strength of at least 4-/5    Time 2   Period Weeks   Status Achieved   PT SHORT TERM GOAL #3   Title Patient will demonstrate the ability to ambulate unlimited distances with SPC and pain no more than 2/10 L knee    Time 2   Period Weeks   Status Partially Met   PT SHORT TERM GOAL #4   Title Patient will be independent in correctly and consistently performing HEP to be  updated PRN    Time 2   Period Weeks   Status Achieved           PT Long Term Goals - 04/10/15 1550    PT LONG TERM GOAL #1   Title Patient will demonstrate  L knee ROM of 0 degrees extension to at least 120 degrees flexion    Time 4   Period Weeks   Status On-going   PT LONG TERM GOAL #2   Title Patient will demonstrate bilateral LE strength of at least 4+/5 and proximal muscle strength at least 4/5    Time 4   Period Weeks   Status Achieved   PT LONG TERM GOAL #3   Title Patient will be able to ambulatue unlimited distances over even and uneven surfaces without assistive device and pain 0/10 L knee    Time 4   Period Weeks   Status On-going   PT LONG TERM GOAL #4   Title Patient will be able to ascend and descend full flight of stairs with no railings, no circumduction, and no unsteadiness throughout, pain L LE 0/10   Time 4   Period Weeks   Status On-going               Plan - 04/25/15 1559    Clinical Impression Statement Manual to scar with scar and edema massage to address both issues.  Pt has had 5/5 stength therefore treatment focused on increasing ROM and functional balance and strength.    PT Next Visit Plan Pt to have g-code next visit (foto)        Problem List Patient Active Problem List   Diagnosis Date Noted  . Arthritis of knee, degenerative 02/05/2015  . History of Helicobacter pylori infection 10/30/2014  . Chronic [name] with cirrhosis 04/11/2014  . De Quervain's disease (radial styloid tenosynovitis) 12/25/2013  . Anorexia 11/21/2012  . FH: colon cancer 11/21/2012  . Early satiety 10/25/2012  . Bowel habit changes 10/25/2012  . Abdominal pain, epigastric 10/25/2012  . Abdominal bloating 10/25/2012  . Constipation 10/25/2012  . Abnormal weight loss 10/25/2012  . Chronic viral [name] 10/25/2012  . Radicular pain of left lower extremity 09/28/2012  . Back pain 09/28/2012  . Sciatica 08/10/2011  . S/P arthroscopy of left  knee 08/10/2011  . Tibial plateau fracture 08/10/2011  . Pain in joint, lower leg 02/12/2011  . Stiffness of joint, not elsewhere classified, lower leg 02/12/2011  . 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 Meniscus, medial, derangement 12/29/2010  . CLOSED FRACTURE OF UPPER END OF TIBIA 08/12/2010    Rayetta Humphrey, PT CLT [phone] 04/25/2015, 4:02 PM  [facility] [address] [place], [place], [address] Phone: [phone]   Fax:  [phone]

## 2015-04-28 ENCOUNTER — Encounter: Payer: Self-pay | Admitting: Orthopedic Surgery

## 2015-04-28 ENCOUNTER — Ambulatory Visit (INDEPENDENT_AMBULATORY_CARE_PROVIDER_SITE_OTHER): Payer: Self-pay | Admitting: Orthopedic Surgery

## 2015-04-28 VITALS — BP 163/95 | Ht 66.0 in | Wt 167.0 lb

## 2015-04-28 DIAGNOSIS — Z96652 Presence of left artificial knee joint: Secondary | ICD-10-CM

## 2015-04-28 DIAGNOSIS — Z4789 Encounter for other orthopedic aftercare: Secondary | ICD-10-CM

## 2015-04-28 NOTE — Progress Notes (Signed)
Patient ID: WESLEE PRESTAGE, female   DOB: 04/30/1956, 59 y.o.   MRN: [id number]  Follow up visit  Chief Complaint  Patient presents with  . Follow-up    6 week recheck on left knee, TKA. DOS 02-05-15.    BP 163/95 mmHg  Ht 5\' 6"  (1.676 m)  Wt 167 lb (75.751 kg)  BMI 26.97 kg/m2  AFTERCARE JOINT REPLACEMENT TKA LEFT KNEE  Mikah is doing well she is almost 12 weeks after her left total knee replacement she is and the toilet with no assistive devices and her knee flexion in therapy is to-1:15. She is on hydrocodone and ibuprofen for pain as well as cyclobenzaprine. 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 She comes in today complaining of peripatellar pain and scar tenderness  Her knee looks good there is some swelling around the peripatellar region and tenderness around the soft tissues and hypersensitivity over the scar  Recommend scar massage with toothbrush progressing from soft to medium to firm  Follow-up 3 months  Continue therapy until complete

## 2015-04-29 ENCOUNTER — Ambulatory Visit (HOSPITAL_COMMUNITY): Payer: Commercial Managed Care - HMO

## 2015-04-29 DIAGNOSIS — R609 Edema, unspecified: Secondary | ICD-10-CM

## 2015-04-29 DIAGNOSIS — M25562 Pain in left knee: Secondary | ICD-10-CM | POA: Diagnosis not present

## 2015-04-29 DIAGNOSIS — Z7409 Other reduced mobility: Secondary | ICD-10-CM

## 2015-04-29 DIAGNOSIS — M25662 Stiffness of left knee, not elsewhere classified: Secondary | ICD-10-CM

## 2015-04-29 DIAGNOSIS — R269 Unspecified abnormalities of gait and mobility: Secondary | ICD-10-CM

## 2015-04-29 DIAGNOSIS — Z96652 Presence of left artificial knee joint: Secondary | ICD-10-CM | POA: Diagnosis not present

## 2015-04-29 DIAGNOSIS — R6889 Other general symptoms and signs: Secondary | ICD-10-CM

## 2015-04-29 DIAGNOSIS — Z658 Other specified problems related to psychosocial circumstances: Secondary | ICD-10-CM | POA: Diagnosis not present

## 2015-04-29 DIAGNOSIS — Z789 Other specified health status: Secondary | ICD-10-CM

## 2015-04-29 NOTE — Therapy (Signed)
[facility] [address] [place], [place], [address] Phone: [phone]   Fax:  [phone]  Physical Therapy Treatment  Patient Details  Name: Colleen Lee MRN: [id number] Date of Birth: 1955-12-04 Referring Provider:  Avon Gully, MD  Encounter Date: 04/29/2015      PT End of Session - 04/29/15 1605    Visit Number 20   Number of Visits 25   Date for PT Re-Evaluation 05/08/15   Authorization Type [employer]  (G-codes done 01-03-23 session)   Authorization Time Period 04/10/15-06/10/15   Authorization - Visit Number 20   Authorization - Number of Visits 25   PT Start Time 1522   PT Stop Time 1603   PT Time Calculation (min) 41 min   Activity Tolerance Patient tolerated treatment well   Behavior During Therapy [facility] for tasks assessed/performed      Past Medical History  Diagnosis Date  . Hypertension   . Fibroids   . [name]     HCV RNA positive 09/2012  . GERD (gastroesophageal reflux disease)   . H. pylori infection 2014    treated with pylera  . Asthma   . PONV (postoperative nausea and vomiting)     pt thinks maybe once she had N&V  . Cirrhosis     Metavir score F4 on elastography 2015  . Fibromyalgia   . Chronic back pain   . Sciatica of left side     Past Surgical History  Procedure Laterality Date  . Knee surgery      left knee  . Colonoscopy  01/18/2008    ZEI:NVRJLW rectum.  Long redundant colon, a diminutive sigmoid polyp status post cold biopsy removed. Hyperplastic polyp. Repeat colonoscopy June 2014 due to family history of colon cancer  . Esophagogastroduodenoscopy  01/18/2008    RMR: Normal esophagus, normal  stomach  . Colonoscopy with esophagogastroduodenoscopy (egd) N/A 11/02/2012    QAX:NGUWKQZS gastric mucosa of doubtful, +H.pylori. Incomplete colonoscopy due to patient unable to tolerate exam, proximal colon seen. Patient refused ACBE.  . Colonoscopy with propofol N/A 03/08/2013    Dr.  Jena Gauss: colonic polyp-removed as scribed above. Internal Hemorrhoids. Pathology did not reveal any colonic tissue, only mucus. SURVEILLANCE DUE Aug 2019  . Polypectomy N/A 03/08/2013    Procedure: POLYPECTOMY;  Surgeon: Corbin Ade, MD;  Location: [facility];  Service: Endoscopy;  Laterality: N/A;  . Multiple extractions with alveoloplasty N/A 10/15/2013    Procedure: MULTIPLE EXTRACTION WITH ALVEOLOPLASTY;  Surgeon: Georgia Lopes, DDS;  Location: [facility];  Service: Oral Surgery;  Laterality: N/A;  . Total knee arthroplasty Left 02/05/2015    Procedure: LEFT TOTAL KNEE ARTHROPLASTY;  Surgeon: Vickki Hearing, MD;  Location: [facility];  Service: Orthopedics;  Laterality: Left;    There were no vitals filed for this visit.  Visit Diagnosis:  Knee stiffness, left  Status post total left knee replacement  Left knee pain  Impaired functional mobility and activity tolerance  Abnormality of gait  Difficulty navigating stairs  Edema      Subjective Assessment - 04/29/15 1526    Subjective Pt stated pain scale 5/10 Lt knee with occasional sharp pain   Currently in Pain? Yes   Pain Score 5    Pain Location Knee   Pain Orientation Left   Pain Descriptors / Indicators Aching;Sore            [facility] PT Assessment - 04/29/15 0001    AROM  Left Knee Extension 2   Left Knee Flexion 115   Transfers   Comments 5 STS 11.03", TUG 6.72   Ambulation/Gait   Gait Comments Cueing required to reduce flexion on hip, reduced TKE, decreased stance phase Lt LE and reduced trunk rotation           [facility] Adult PT Treatment/Exercise - 04/29/15 0001    Exercises   Exercises Knee/Hip   Knee/Hip Exercises: Stretches   Quad Stretch 3 reps;30 seconds   Quad Stretch Limitations prone with rope    Knee: Self-Stretch to increase Flexion Left;10 seconds   Knee: Self-Stretch Limitations 12" step   Gastroc Stretch Both;3 reps;30 seconds   Gastroc Stretch Limitations slant board    Knee/Hip Exercises:  Standing   Terminal Knee Extension Strengthening;Left;15 reps   Functional Squat 15 reps   Stairs 5 RT 7" no hands    SLS with Vectors 10" x 3    Knee/Hip Exercises: Supine   Quad Sets 15 reps   Heel Slides 10 reps   Knee/Hip Exercises: Prone   Contract/Relax to Increase Flexion x5   Other Prone Exercises terminal knee extension 5x5"            PT Short Term Goals - 04/10/15 1550    PT SHORT TERM GOAL #1   Title Patient will demonstrate L knee ROM of 0-100 degrees    Time 2   Period Weeks   Status Achieved   PT SHORT TERM GOAL #2   Title Patient to demonstrate L LE strength of at least 4-/5 and proximal muscle strength of at least 4-/5    Time 2   Period Weeks   Status Achieved   PT SHORT TERM GOAL #3   Title Patient will demonstrate the ability to ambulate unlimited distances with SPC and pain no more than 2/10 L knee    Time 2   Period Weeks   Status Partially Met   PT SHORT TERM GOAL #4   Title Patient will be independent in correctly and consistently performing HEP to be updated PRN    Time 2   Period Weeks   Status Achieved           PT Long Term Goals - 04/10/15 1550    PT LONG TERM GOAL #1   Title Patient will demonstrate L knee ROM of 0 degrees extension to at least 120 degrees flexion    Time 4   Period Weeks   Status On-going   PT LONG TERM GOAL #2   Title Patient will demonstrate bilateral LE strength of at least 4+/5 and proximal muscle strength at least 4/5    Time 4   Period Weeks   Status Achieved   PT LONG TERM GOAL #3   Title Patient will be able to ambulatue unlimited distances over even and uneven surfaces without assistive device and pain 0/10 L knee    Time 4   Period Weeks   Status On-going   PT LONG TERM GOAL #4   Title Patient will be able to ascend and descend full flight of stairs with no railings, no circumduction, and no unsteadiness throughout, pain L LE 0/10   Time 4   Period Weeks   Status On-going                Plan - 04/29/15 1815    Clinical Impression Statement Session focus on functional balance and strengthening and improving ROM both flexion and extension.  Reviewed factors  for gcode though not due this session per PT plan with the following findings.  Pt continues to be limited by pain scale average 5-6/10, improved gait mechanics though does continue to demonstrate forward trunk rotation, decreased stance phase Lt LE, decreased trunk rotation.  ROM is improving but does to be imparied with AROM 2-115.  Pt has met goals for strength 5/5 all musculature and improve functioanl mobilty wtih 5 STS in 11.03" and TUG at 6.75".  Pt will continue to benefit from skilled intervention to improve functional strenghteing with stairs, improve gait mechaincs, increased ROM and manual techniques to assist with edema control for pain.     PT Next Visit Plan Continue with functional strengthening with stairs to reduce compensation, improve ROM and gait mechanicns and manual technques for scar tissue and edema control for pain.        Problem List Patient Active Problem List   Diagnosis Date Noted  . Arthritis of knee, degenerative 02/05/2015  . History of Helicobacter pylori infection 10/30/2014  . Chronic [name] with cirrhosis 04/11/2014  . De Quervain's disease (radial styloid tenosynovitis) 12/25/2013  . Anorexia 11/21/2012  . FH: colon cancer 11/21/2012  . Early satiety 10/25/2012  . Bowel habit changes 10/25/2012  . Abdominal pain, epigastric 10/25/2012  . Abdominal bloating 10/25/2012  . Constipation 10/25/2012  . Abnormal weight loss 10/25/2012  . Chronic viral [name] 10/25/2012  . Radicular pain of left lower extremity 09/28/2012  . Back pain 09/28/2012  . Sciatica 08/10/2011  . S/P arthroscopy of left knee 08/10/2011  . Tibial plateau fracture 08/10/2011  . Pain in joint, lower leg 02/12/2011  . Stiffness of joint, not elsewhere classified, lower leg 02/12/2011  . Pathological  dislocation 02/12/2011  . Meniscus, medial, derangement 12/29/2010  . CLOSED FRACTURE OF UPPER END OF TIBIA 08/12/2010   Ihor Austin, [place]; [facility] [phone]'  Aldona Lento 04/29/2015, 6:23 PM  [facility] [address] [place], [place], [address] Phone: [phone]   Fax:  [phone]

## 2015-04-30 ENCOUNTER — Encounter (HOSPITAL_COMMUNITY): Payer: Commercial Managed Care - HMO | Admitting: Physical Therapy

## 2015-05-02 ENCOUNTER — Ambulatory Visit (HOSPITAL_COMMUNITY): Payer: Commercial Managed Care - HMO

## 2015-05-02 NOTE — Telephone Encounter (Signed)
No show, called and spoke to pt who stated she had forgotten about apt. pt reminded next apt date and time.    [address], [place]; [facility] [phone]

## 2015-05-05 ENCOUNTER — Ambulatory Visit (HOSPITAL_COMMUNITY): Payer: Commercial Managed Care - HMO | Attending: Internal Medicine | Admitting: Physical Therapy

## 2015-05-05 DIAGNOSIS — M25662 Stiffness of left knee, not elsewhere classified: Secondary | ICD-10-CM | POA: Diagnosis not present

## 2015-05-05 DIAGNOSIS — M25562 Pain in left knee: Secondary | ICD-10-CM | POA: Diagnosis not present

## 2015-05-05 DIAGNOSIS — Z789 Other specified health status: Secondary | ICD-10-CM

## 2015-05-05 DIAGNOSIS — R269 Unspecified abnormalities of gait and mobility: Secondary | ICD-10-CM | POA: Diagnosis not present

## 2015-05-05 DIAGNOSIS — Z658 Other specified problems related to psychosocial circumstances: Secondary | ICD-10-CM | POA: Insufficient documentation

## 2015-05-05 DIAGNOSIS — R6889 Other general symptoms and signs: Secondary | ICD-10-CM

## 2015-05-05 DIAGNOSIS — Z96652 Presence of left artificial knee joint: Secondary | ICD-10-CM | POA: Diagnosis not present

## 2015-05-05 DIAGNOSIS — Z7409 Other reduced mobility: Secondary | ICD-10-CM | POA: Diagnosis not present

## 2015-05-05 NOTE — Therapy (Signed)
[place], [place], [address] Phone: [phone]   Fax:  [phone]  Physical Therapy Treatment  Patient Details  Name: Colleen Lee MRN: [id number] Date of Birth: [birth year] Referring Provider:  Rosita Fire, MD  Encounter Date: 05/05/2015      PT End of Session - 05/05/15 1539    Visit Number 21   Number of Visits 25   Date for PT Re-Evaluation 05/08/15   Authorization Type [employer]  (G-codes done 10/05/2022 session)   Authorization Time Period 04/10/15-06/10/15   Authorization - Visit Number 21   Authorization - Number of Visits 25   PT Start Time 3614   PT Stop Time 1600   PT Time Calculation (min) 42 min   Activity Tolerance Patient tolerated treatment well   Behavior During Therapy [facility] for tasks assessed/performed      Past Medical History  Diagnosis Date  . Hypertension   . Fibroids   . [name]     HCV RNA positive 09/2012  . GERD (gastroesophageal reflux disease)   . H. pylori infection 2014    treated with pylera  . Asthma   . PONV (postoperative nausea and vomiting)     pt thinks maybe once she had N&V  . Cirrhosis     Metavir score F4 on elastography 2015  . Fibromyalgia   . Chronic back pain   . Sciatica of left side     Past Surgical History  Procedure Laterality Date  . Knee surgery      left knee  . Colonoscopy  01/18/2008    ERX:[id number] rectum.  Long redundant colon, a diminutive sigmoid polyp status post cold biopsy removed. Hyperplastic polyp. Repeat colonoscopy June 2014 due to family history of colon cancer  . Esophagogastroduodenoscopy  01/18/2008    RMR: Normal esophagus, normal  stomach  . Colonoscopy with esophagogastroduodenoscopy (egd) N/A 11/02/2012    [id number]:[id number] gastric mucosa of doubtful, +H.pylori. Incomplete colonoscopy due to patient unable to tolerate exam, proximal colon seen. Patient refused ACBE.  . Colonoscopy with propofol N/A 03/08/2013    Dr.  Gala Romney: colonic polyp-removed as scribed above. Internal Hemorrhoids. Pathology did not reveal any colonic tissue, only mucus. SURVEILLANCE DUE Aug 2019  . Polypectomy N/A 03/08/2013    Procedure: POLYPECTOMY;  Surgeon: Daneil Dolin, MD;  [facility]: [facility];  Service: Endoscopy;  Laterality: N/A;  . Multiple extractions with alveoloplasty N/A 10/15/2013    Procedure: MULTIPLE EXTRACTION WITH ALVEOLOPLASTY;  Surgeon: Gae Bon, DDS;  Location: [place];  Service: Oral Surgery;  Laterality: N/A;  . Total knee arthroplasty Left 02/05/2015    Procedure: LEFT TOTAL KNEE ARTHROPLASTY;  Surgeon: Carole Civil, MD;  Location: [facility];  Service: Orthopedics;  Laterality: Left;    There were no vitals filed for this visit.  Visit Diagnosis:  Knee stiffness, left  Status post total left knee replacement  Left knee pain  Abnormality of gait  Difficulty navigating stairs      Subjective Assessment - 05/05/15 1602    Subjective Pt states her pain continues to be high in her Lt knee with some swelling at times.   Currently in Pain? Yes   Pain Score 5    Pain Location Knee   Pain Orientation Left                         [facility] Adult PT Treatment/Exercise - 05/05/15 1539  Knee/Hip Exercises: Stretches   Active Hamstring Stretch Left;3 reps;30 seconds   Active Hamstring Stretch Limitations standing 12"   Knee: Self-Stretch to increase Flexion Left;10 seconds   Knee: Self-Stretch Limitations 12" step   Gastroc Stretch Both;3 reps;30 seconds   Gastroc Stretch Limitations slant board    Knee/Hip Exercises: Aerobic   Stationary Bike seat 8 x 8 minutes   Knee/Hip Exercises: Standing   Forward Lunges 15 reps   Forward Lunges Limitations onto floor LT lead no UE's   Lateral Step Up Left;Hand Hold: 1;15 reps;Step Height: 6"   Lateral Step Up Limitations 6 inch step and 1 HHA   Forward Step Up Left;10 reps;Hand Hold: 1;Step Height: 6"   Forward Step Up Limitations 4 inch box and  airex   Step Down Step Height: 4";10 reps;Left;1 set   Step Down Limitations 6 inch step    Wall Squat 10 reps;3 seconds   Stairs 5 RT 7" no hands    SLS with Vectors 10" x 3    Knee/Hip Exercises: Seated   Sit to Sand 10 reps;without UE support                  PT Short Term Goals - 04/10/15 1550    PT SHORT TERM GOAL #1   Title Patient will demonstrate L knee ROM of 0-100 degrees    Time 2   Period Weeks   Status Achieved   PT SHORT TERM GOAL #2   Title Patient to demonstrate L LE strength of at least 4-/5 and proximal muscle strength of at least 4-/5    Time 2   Period Weeks   Status Achieved   PT SHORT TERM GOAL #3   Title Patient will demonstrate the ability to ambulate unlimited distances with SPC and pain no more than 2/10 L knee    Time 2   Period Weeks   Status Partially Met   PT SHORT TERM GOAL #4   Title Patient will be independent in correctly and consistently performing HEP to be updated PRN    Time 2   Period Weeks   Status Achieved           PT Long Term Goals - 04/10/15 1550    PT LONG TERM GOAL #1   Title Patient will demonstrate L knee ROM of 0 degrees extension to at least 120 degrees flexion    Time 4   Period Weeks   Status On-going   PT LONG TERM GOAL #2   Title Patient will demonstrate bilateral LE strength of at least 4+/5 and proximal muscle strength at least 4/5    Time 4   Period Weeks   Status Achieved   PT LONG TERM GOAL #3   Title Patient will be able to ambulatue unlimited distances over even and uneven surfaces without assistive device and pain 0/10 L knee    Time 4   Period Weeks   Status On-going   PT LONG TERM GOAL #4   Title Patient will be able to ascend and descend full flight of stairs with no railings, no circumduction, and no unsteadiness throughout, pain L LE 0/10   Time 4   Period Weeks   Status On-going               Plan - 05/05/15 1559    Clinical Impression Statement Less compensation noted  today with stairs.  continues to require verbal cues for form and posture with all exericises as  well as ambulation as patient tends to drag her feet.  Increased step height today with 1 HHA and added wallslides to target quads.     PT Next Visit Plan Continue with functional strengthening with stairs to reduce compensation, improve ROM and gait mechanicns and manual technques for scar tissue and edema control for pain as needed.        Problem List Patient Active Problem List   Diagnosis Date Noted  . Arthritis of knee, degenerative 02/05/2015  . History of Helicobacter pylori infection 10/30/2014  . Chronic [name] with cirrhosis ([place]) 04/11/2014  . De Quervain's disease (radial styloid tenosynovitis) 12/25/2013  . Anorexia 11/21/2012  . FH: colon cancer 11/21/2012  . Early satiety 10/25/2012  . Bowel habit changes 10/25/2012  . Abdominal pain, epigastric 10/25/2012  . Abdominal bloating 10/25/2012  . Constipation 10/25/2012  . Abnormal weight loss 10/25/2012  . Chronic viral [name] ([place]) 10/25/2012  . Radicular pain of left lower extremity 09/28/2012  . Back pain 09/28/2012  . Sciatica 08/10/2011  . S/P arthroscopy of left knee 08/10/2011  . Tibial plateau fracture 08/10/2011  . Pain in joint, lower leg 02/12/2011  . Stiffness of joint, not elsewhere classified, lower leg 02/12/2011  . 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 Meniscus, medial, derangement 12/29/2010  . CLOSED FRACTURE OF UPPER END OF TIBIA 08/12/2010    Teena Irani, PTA/CLT [phone]  05/05/2015, 4:03 PM  [place], [place], [address] Phone: [phone]   Fax:  [phone]

## 2015-05-07 ENCOUNTER — Ambulatory Visit (HOSPITAL_COMMUNITY): Payer: Commercial Managed Care - HMO | Admitting: Physical Therapy

## 2015-05-07 DIAGNOSIS — R269 Unspecified abnormalities of gait and mobility: Secondary | ICD-10-CM | POA: Diagnosis not present

## 2015-05-07 DIAGNOSIS — M25662 Stiffness of left knee, not elsewhere classified: Secondary | ICD-10-CM | POA: Diagnosis not present

## 2015-05-07 DIAGNOSIS — Z7409 Other reduced mobility: Secondary | ICD-10-CM

## 2015-05-07 DIAGNOSIS — M25562 Pain in left knee: Secondary | ICD-10-CM

## 2015-05-07 DIAGNOSIS — R6889 Other general symptoms and signs: Secondary | ICD-10-CM

## 2015-05-07 DIAGNOSIS — Z789 Other specified health status: Secondary | ICD-10-CM

## 2015-05-07 DIAGNOSIS — Z658 Other specified problems related to psychosocial circumstances: Secondary | ICD-10-CM | POA: Diagnosis not present

## 2015-05-07 DIAGNOSIS — Z96652 Presence of left artificial knee joint: Secondary | ICD-10-CM | POA: Diagnosis not present

## 2015-05-07 NOTE — Therapy (Signed)
[place], [place], [address] Phone: [phone]   Fax:  [phone]  Physical Therapy Treatment  Patient Details  Name: Colleen Lee MRN: [id number] Date of Birth: 1956-07-10 Referring Provider:  Rosita Fire, MD  Encounter Date: 05/07/2015      PT End of Session - 05/07/15 1611    Visit Number 22   Number of Visits 25   Date for PT Re-Evaluation 05/08/15   Authorization Type [employer]  (G-codes done 10-28-2022 session)   Authorization Time Period 04/10/15-06/10/15   Authorization - Visit Number 24   Authorization - Number of Visits 25   PT Start Time 1324   PT Stop Time 1605   PT Time Calculation (min) 50 min   Activity Tolerance Patient tolerated treatment well   Behavior During Therapy [facility] for tasks assessed/performed      Past Medical History  Diagnosis Date  . Hypertension   . Fibroids   . [name]     HCV RNA positive 09/2012  . GERD (gastroesophageal reflux disease)   . H. pylori infection 2014    treated with pylera  . Asthma   . PONV (postoperative nausea and vomiting)     pt thinks maybe once she had N&V  . Cirrhosis     Metavir score F4 on elastography 2015  . Fibromyalgia   . Chronic back pain   . Sciatica of left side     Past Surgical History  Procedure Laterality Date  . Knee surgery      left knee  . Colonoscopy  01/18/2008    [id number]:[id number] rectum.  Long redundant colon, a diminutive sigmoid polyp status post cold biopsy removed. Hyperplastic polyp. Repeat colonoscopy June 2014 due to family history of colon cancer  . Esophagogastroduodenoscopy  01/18/2008    RMR: Normal esophagus, normal  stomach  . Colonoscopy with esophagogastroduodenoscopy (egd) N/A 11/02/2012    GUY:[id number] gastric mucosa of doubtful, +H.pylori. Incomplete colonoscopy due to patient unable to tolerate exam, proximal colon seen. Patient refused ACBE.  . Colonoscopy with propofol N/A 03/08/2013    Dr.  Gala Romney: colonic polyp-removed as scribed above. Internal Hemorrhoids. Pathology did not reveal any colonic tissue, only mucus. SURVEILLANCE DUE Aug 2019  . Polypectomy N/A 03/08/2013    Procedure: POLYPECTOMY;  Surgeon: Daneil Dolin, MD;  Location: [facility];  Service: Endoscopy;  Laterality: N/A;  . Multiple extractions with alveoloplasty N/A 10/15/2013    Procedure: MULTIPLE EXTRACTION WITH ALVEOLOPLASTY;  Surgeon: Gae Bon, DDS;  Location: [place];  Service: Oral Surgery;  Laterality: N/A;  . Total knee arthroplasty Left 02/05/2015    Procedure: LEFT TOTAL KNEE ARTHROPLASTY;  Surgeon: Carole Civil, MD;  Location: [facility];  Service: Orthopedics;  Laterality: Left;    There were no vitals filed for this visit.  Visit Diagnosis:  Knee stiffness, left  Status post total left knee replacement  Left knee pain  Abnormality of gait  Difficulty navigating stairs  Impaired functional mobility and activity tolerance      Subjective Assessment - 05/07/15 1524    Subjective Pt states her pain is 7/10 and states she is icing her knee at home.     Currently in Pain? Yes   Pain Score 7    Pain Location Knee   Pain Orientation Left   Pain Descriptors / Indicators Aching   Pain Type Chronic pain  [facility] Adult PT Treatment/Exercise - 05/07/15 1525    Knee/Hip Exercises: Stretches   Active Hamstring Stretch Left;3 reps;30 seconds   Active Hamstring Stretch Limitations standing 12"   Knee: Self-Stretch to increase Flexion Left;10 seconds   Knee: Self-Stretch Limitations 12" step   Gastroc Stretch Both;3 reps;30 seconds   Gastroc Stretch Limitations slant board    Knee/Hip Exercises: Aerobic   Stationary Bike seat 8 x 8 minutes   Knee/Hip Exercises: Standing   Forward Lunges 15 reps   Forward Lunges Limitations onto floor LT lead no UE's   Lateral Step Up Left;Hand Hold: 1;15 reps;Step Height: 6"   Lateral Step Up Limitations 6 inch step and 1  HHA   Forward Step Up Left;10 reps;Hand Hold: 1;Step Height: 6"   Forward Step Up Limitations 6 inch box   Step Down Step Height: 4";10 reps;Left;1 set   Step Down Limitations 6 inch step    Functional Squat 15 reps   Knee/Hip Exercises: Seated   Sit to Sand 10 reps;without UE support   Manual Therapy   Manual Therapy Edema management;Myofascial release   Edema Management Lt knee with elevation   Myofascial Release superior LT knee at scar                  PT Short Term Goals - 04/10/15 1550    PT SHORT TERM GOAL #1   Title Patient will demonstrate L knee ROM of 0-100 degrees    Time 2   Period Weeks   Status Achieved   PT SHORT TERM GOAL #2   Title Patient to demonstrate L LE strength of at least 4-/5 and proximal muscle strength of at least 4-/5    Time 2   Period Weeks   Status Achieved   PT SHORT TERM GOAL #3   Title Patient will demonstrate the ability to ambulate unlimited distances with SPC and pain no more than 2/10 L knee    Time 2   Period Weeks   Status Partially Met   PT SHORT TERM GOAL #4   Title Patient will be independent in correctly and consistently performing HEP to be updated PRN    Time 2   Period Weeks   Status Achieved           PT Long Term Goals - 04/10/15 1550    PT LONG TERM GOAL #1   Title Patient will demonstrate L knee ROM of 0 degrees extension to at least 120 degrees flexion    Time 4   Period Weeks   Status On-going   PT LONG TERM GOAL #2   Title Patient will demonstrate bilateral LE strength of at least 4+/5 and proximal muscle strength at least 4/5    Time 4   Period Weeks   Status Achieved   PT LONG TERM GOAL #3   Title Patient will be able to ambulatue unlimited distances over even and uneven surfaces without assistive device and pain 0/10 L knee    Time 4   Period Weeks   Status On-going   PT LONG TERM GOAL #4   Title Patient will be able to ascend and descend full flight of stairs with no railings, no  circumduction, and no unsteadiness throughout, pain L LE 0/10   Time 4   Period Weeks   Status On-going               Plan - 05/07/15 1612    Clinical Impression Statement PT with noted  antalgia and swelling in LT knee.  completed functional strengthening and stretching prior to manual therapy.  Massage and myofascial techniques completed to Lt knee with eleveation to reduce edema, pain and adhesions.  Noted scar tissue/hardness superior patella in scar line.  Able to resolve completely with myofascial techniques.   Noted improvment with ambulation without antalgia and pain reduced to 3/10 at end of session.    PT Next Visit Plan Continue with functional strengthening and stretching.  Utilize myofascial techniques and complete retro massage as needed for Lt knee pain and dysfunction.         Problem List Patient Active Problem List   Diagnosis Date Noted  . Arthritis of knee, degenerative 02/05/2015  . History of Helicobacter pylori infection 10/30/2014  . Chronic [name] with cirrhosis ([place]) 04/11/2014  . De Quervain's disease (radial styloid tenosynovitis) 12/25/2013  . Anorexia 11/21/2012  . FH: colon cancer 11/21/2012  . Early satiety 10/25/2012  . Bowel habit changes 10/25/2012  . Abdominal pain, epigastric 10/25/2012  . Abdominal bloating 10/25/2012  . Constipation 10/25/2012  . Abnormal weight loss 10/25/2012  . Chronic viral [name] ([place]) 10/25/2012  . Radicular pain of left lower extremity 09/28/2012  . Back pain 09/28/2012  . Sciatica 08/10/2011  . S/P arthroscopy of left knee 08/10/2011  . Tibial plateau fracture 08/10/2011  . Pain in joint, lower leg 02/12/2011  . Stiffness of joint, not elsewhere classified, lower leg 02/12/2011  . 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 Meniscus, medial, derangement 12/29/2010  . CLOSED FRACTURE OF UPPER END OF TIBIA 08/12/2010   Teena Irani, PTA/CLT [phone] 05/07/2015, 4:29 PM  [place] [address] [place], [place], [address] Phone: [phone]   Fax:  [phone]

## 2015-05-09 ENCOUNTER — Ambulatory Visit (HOSPITAL_COMMUNITY): Payer: Commercial Managed Care - HMO

## 2015-05-09 DIAGNOSIS — Z789 Other specified health status: Secondary | ICD-10-CM

## 2015-05-09 DIAGNOSIS — M25662 Stiffness of left knee, not elsewhere classified: Secondary | ICD-10-CM | POA: Diagnosis not present

## 2015-05-09 DIAGNOSIS — M25562 Pain in left knee: Secondary | ICD-10-CM | POA: Diagnosis not present

## 2015-05-09 DIAGNOSIS — Z7409 Other reduced mobility: Secondary | ICD-10-CM

## 2015-05-09 DIAGNOSIS — R269 Unspecified abnormalities of gait and mobility: Secondary | ICD-10-CM | POA: Diagnosis not present

## 2015-05-09 DIAGNOSIS — Z96652 Presence of left artificial knee joint: Secondary | ICD-10-CM | POA: Diagnosis not present

## 2015-05-09 DIAGNOSIS — R6889 Other general symptoms and signs: Secondary | ICD-10-CM

## 2015-05-09 DIAGNOSIS — Z658 Other specified problems related to psychosocial circumstances: Secondary | ICD-10-CM | POA: Diagnosis not present

## 2015-05-09 NOTE — Therapy (Addendum)
[place], [place], [address] Phone: [phone]   Fax:  [phone]  Physical Therapy Treatment  Patient Details  Name: Colleen Lee MRN: [id number] Date of Birth: April 11, 1956 Referring Provider:  Carole Civil, MD  Encounter Date: 05/09/2015      PT End of Session - 05/09/15 1547    Visit Number 23   Number of Visits 24   Date for PT Re-Evaluation 05/08/15   Authorization Type [employer]  (G-codes done 10-31-2022 session)   Authorization Time Period 04/10/15-06/10/15   Authorization - Visit Number 23   Authorization - Number of Visits 25   PT Start Time 2694   PT Stop Time 1606   PT Time Calculation (min) 50 min   Activity Tolerance Patient tolerated treatment well   Behavior During Therapy [facility] for tasks assessed/performed      Past Medical History  Diagnosis Date  . Hypertension   . Fibroids   . [name]     HCV RNA positive 09/2012  . GERD (gastroesophageal reflux disease)   . H. pylori infection 2014    treated with pylera  . Asthma   . PONV (postoperative nausea and vomiting)     pt thinks maybe once she had N&V  . Cirrhosis     Metavir score F4 on elastography 2015  . Fibromyalgia   . Chronic back pain   . Sciatica of left side     Past Surgical History  Procedure Laterality Date  . Knee surgery      left knee  . Colonoscopy  01/18/2008    WNI:OEVOJJ rectum.  Long redundant colon, a diminutive sigmoid polyp status post cold biopsy removed. Hyperplastic polyp. Repeat colonoscopy June 2014 due to family history of colon cancer  . Esophagogastroduodenoscopy  01/18/2008    RMR: Normal esophagus, normal  stomach  . Colonoscopy with esophagogastroduodenoscopy (egd) N/A 11/02/2012    KKX:[id number] gastric mucosa of doubtful, +H.pylori. Incomplete colonoscopy due to patient unable to tolerate exam, proximal colon seen. Patient refused ACBE.  . Colonoscopy with propofol N/A 03/08/2013    Dr.  Gala Romney: colonic polyp-removed as scribed above. Internal Hemorrhoids. Pathology did not reveal any colonic tissue, only mucus. SURVEILLANCE DUE Aug 2019  . Polypectomy N/A 03/08/2013    Procedure: POLYPECTOMY;  Surgeon: Daneil Dolin, MD;  [facility];  Service: Endoscopy;  Laterality: N/A;  . Multiple extractions with alveoloplasty N/A 10/15/2013    Procedure: MULTIPLE EXTRACTION WITH ALVEOLOPLASTY;  Surgeon: Gae Bon, DDS;  Location: [place];  Service: Oral Surgery;  Laterality: N/A;  . Total knee arthroplasty Left 02/05/2015    Procedure: LEFT TOTAL KNEE ARTHROPLASTY;  Surgeon: Carole Civil, MD;  [facility];  Service: Orthopedics;  Laterality: Left;    There were no vitals filed for this visit.  Visit Diagnosis:  Knee stiffness, left  Status post total left knee replacement  Left knee pain  Abnormality of gait  Difficulty navigating stairs  Impaired functional mobility and activity tolerance      Subjective Assessment - 05/09/15 1541    Subjective Pt stated knee is feeling okay today, mainly tightness and a little sore, no real pain today.  Has been icing for pain daily.   Currently in Pain? No/denies            [facility] PT Assessment - 05/09/15 0001    Assessment   Medical Diagnosis L TKR    Onset Date/Surgical Date 02/05/15  Next MD Visit 07/29/2015 Aline Brochure   Posture/Postural Control   Posture Comments increased lumbar lordosis, forward head, IR shoulders B, flexed at hips, reduced weight bearing L LE with no heel touch L LE, knee flexion in stance L LE    AROM   Left Knee Extension 2   Left Knee Flexion 115   Strength   Right Hip Flexion 5/5   Right Hip ABduction 5/5   Left Hip Flexion 5/5   Left Hip Extension 5/5   Left Hip ABduction 5/5   Right Knee Flexion 5/5   Right Knee Extension 5/5   Left Knee Flexion 5/5   Left Knee Extension 5/5   Right Ankle Dorsiflexion 5/5   Left Ankle Dorsiflexion 5/5           [facility] Adult PT  Treatment/Exercise - 05/09/15 0001    Exercises   Exercises Knee/Hip   Knee/Hip Exercises: Stretches   Active Hamstring Stretch Left;3 reps;30 seconds   Active Hamstring Stretch Limitations standing 12"   Quad Stretch 3 reps;30 seconds   Quad Stretch Limitations prone with rope    Knee: Self-Stretch to increase Flexion Left;10 seconds   Knee: Self-Stretch Limitations 12" step   Gastroc Stretch Both;3 reps;30 seconds   Gastroc Stretch Limitations slant board    Knee/Hip Exercises: Aerobic   Stationary Bike seat 8 x 8 minutes   Knee/Hip Exercises: Standing   Lateral Step Up Left;Hand Hold: 1;15 reps;Step Height: 6"   Forward Step Up Left;10 reps;Hand Hold: 1;Step Height: 6"   Stairs 5 RT 7" no hands    Knee/Hip Exercises: Supine   Quad Sets 15 reps   Short Arc Quad Sets 15 reps   Heel Slides 10 reps   Manual Therapy   Manual Therapy Edema management;Myofascial release   Edema Management Lt knee with elevation   Myofascial Release superior LT knee at scar           PT Short Term Goals - 05/09/15 1548    PT SHORT TERM GOAL #1   Title Patient will demonstrate L knee ROM of 0-100 degrees    Baseline 05/09/2015 2-115   Status Partially Met   PT SHORT TERM GOAL #2   Title Patient to demonstrate L LE strength of at least 4-/5 and proximal muscle strength of at least 4-/5    Status Achieved   PT SHORT TERM GOAL #3   Title Patient will demonstrate the ability to ambulate unlimited distances with SPC and pain no more than 2/10 L knee    Baseline 05/09/2015: Able to walk for an hour, increased pain scale with walking    Status On-going   PT SHORT TERM GOAL #4   Title Patient will be independent in correctly and consistently performing HEP to be updated PRN    Status Achieved           PT Long Term Goals - 05/09/15 1550    PT LONG TERM GOAL #1   Title Patient will demonstrate L knee ROM of 0 degrees extension to at least 120 degrees flexion    Baseline 05/09/2015 AROM 2-115  degrees   Status On-going   PT LONG TERM GOAL #2   Title Patient will demonstrate bilateral LE strength of at least 4+/5 and proximal muscle strength at least 4/5    Status Achieved   PT LONG TERM GOAL #3   Title Patient will be able to ambulatue unlimited distances over even and uneven surfaces without assistive device and pain  0/10 L knee    Baseline 05/09/2015:  able to amubulate for an hour with no AD but c/o increased pain with walking   Status On-going   PT LONG TERM GOAL #4   Title Patient will be able to ascend and descend full flight of stairs with no railings, no circumduction, and no unsteadiness throughout, pain L LE 0/10   Baseline 05/09/2015: Able to ascend and descend stairs with proper mechanics with reciprocal pattern with no railing or circumduciton or reports of pain with staors   Status Achieved               Plan - 05/09/15 1610    Clinical Impression Statement Reassessment complete with the following findings:  Pt reports compliance with HEP daily and able to demonstrate and verbalize appropriate technique with exercises.  Strength is 5/5 for all LE musculature and pt able to demonstrate improve functional abilities ascend and descending stairwell reciprocal pattern with proper mechanics and no HHA required.  Pt reports ability to ambulate with no AD for an hour but does report increased pain following.  Pt does continue to be limited by pain and ROM 2-116 degrees.  Ended session with manual scar tissue massage and manual soft tissue massage for pain control.  Pt stated she feels comfortable completeing ROM HEP at home and continuing manual scar tissue massage with toothbrush as MD recommended.  Feel pt comfortable with discharge to HEP as no further skilled intervention required, pt able to demonstrate and verbalzie approriate exercises and manual techniques at home.     PT Next Visit Plan Recommend D/C to HEP.     PT Home Exercise Plan Continue with current ROM  exercises and stretches, pt has copy at home.        Problem List Patient Active Problem List   Diagnosis Date Noted  . Arthritis of knee, degenerative 02/05/2015  . History of Helicobacter pylori infection 10/30/2014  . Chronic [name] with cirrhosis ([place]) 04/11/2014  . De Quervain's disease (radial styloid tenosynovitis) 12/25/2013  . Anorexia 11/21/2012  . FH: colon cancer 11/21/2012  . Early satiety 10/25/2012  . Bowel habit changes 10/25/2012  . Abdominal pain, epigastric 10/25/2012  . Abdominal bloating 10/25/2012  . Constipation 10/25/2012  . Abnormal weight loss 10/25/2012  . Chronic viral [name] ([place]) 10/25/2012  . Radicular pain of left lower extremity 09/28/2012  . Back pain 09/28/2012  . Sciatica 08/10/2011  . S/P arthroscopy of left knee 08/10/2011  . Tibial plateau fracture 08/10/2011  . Pain in joint, lower leg 02/12/2011  . Stiffness of joint, not elsewhere classified, lower leg 02/12/2011  . 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 Meniscus, medial, derangement 12/29/2010  . CLOSED FRACTURE OF UPPER END OF TIBIA 08/12/2010   Ihor Austin, LPTA; CBIS [phone]  Aldona Lento 05/09/2015, 4:26 PM  [place] [address] [place], [place], [address] Phone: [phone]   Fax:  [phone]

## 2015-05-11 DIAGNOSIS — I10 Essential (primary) hypertension: Secondary | ICD-10-CM | POA: Diagnosis not present

## 2015-05-11 DIAGNOSIS — E784 Other hyperlipidemia: Secondary | ICD-10-CM | POA: Diagnosis not present

## 2015-05-12 ENCOUNTER — Encounter (HOSPITAL_COMMUNITY): Payer: Commercial Managed Care - HMO

## 2015-05-13 ENCOUNTER — Other Ambulatory Visit: Payer: Self-pay | Admitting: *Deleted

## 2015-05-13 ENCOUNTER — Telehealth: Payer: Self-pay | Admitting: Orthopedic Surgery

## 2015-05-13 ENCOUNTER — Other Ambulatory Visit: Payer: Self-pay | Admitting: Orthopedic Surgery

## 2015-05-13 MED ORDER — HYDROCODONE-ACETAMINOPHEN 10-325 MG PO TABS: 1.0000 | ORAL_TABLET | Freq: Four times a day (QID) | ORAL | Status: DC | PRN

## 2015-05-13 MED ORDER — HYDROCODONE-ACETAMINOPHEN 7.5-325 MG PO TABS: 1.0000 | ORAL_TABLET | Freq: Four times a day (QID) | ORAL | Status: DC | PRN

## 2015-05-13 NOTE — Progress Notes (Signed)
Please be advised:  You are on a narcotic pain medication to control your pain. Medications such as hydrocodone, Norco, oxycodone, OxyContin,  Dilaudid and similar medications have been associated with dependence and addiction. Please work with me to try to eliminate these medications as soon as possible to avoid complications of dependence and addiction.  Only take these medications as prescribed and for the problem they were prescribed for. Once your injury has healed please wean yourself from these medications. Once your surgery has been completed please start taking as little of this medication as possible.  If you have a problem taking yourself off of these medications please discuss with me so that together we can avoid serious complications which include dependence, addiction and death.   Sign that you have read and understood    _______________________________________________________

## 2015-05-13 NOTE — Telephone Encounter (Signed)
Patient picked up Rx

## 2015-05-13 NOTE — Telephone Encounter (Signed)
Prescription available, patient aware  

## 2015-05-13 NOTE — Telephone Encounter (Signed)
Patient is calling requesting a refill on medication HYDROcodone-acetaminophen (NORCO) 10-325 MG per tablet please advise?

## 2015-05-14 ENCOUNTER — Encounter (HOSPITAL_COMMUNITY): Payer: Commercial Managed Care - HMO | Admitting: Physical Therapy

## 2015-05-16 ENCOUNTER — Encounter (HOSPITAL_COMMUNITY): Payer: Commercial Managed Care - HMO | Admitting: Physical Therapy

## 2015-05-19 ENCOUNTER — Encounter (HOSPITAL_COMMUNITY): Payer: Commercial Managed Care - HMO | Admitting: Physical Therapy

## 2015-06-02 ENCOUNTER — Emergency Department (HOSPITAL_COMMUNITY)
Admission: EM | Admit: 2015-06-02 | Discharge: 2015-06-02 | Disposition: A | Discharge status: Home / Self Care | Payer: Commercial Managed Care - HMO | Attending: Emergency Medicine | Admitting: Emergency Medicine

## 2015-06-02 ENCOUNTER — Encounter (HOSPITAL_COMMUNITY): Payer: Self-pay | Admitting: Emergency Medicine

## 2015-06-02 DIAGNOSIS — Z7982 Long term (current) use of aspirin: Secondary | ICD-10-CM | POA: Diagnosis not present

## 2015-06-02 DIAGNOSIS — Z880 Allergy status to penicillin: Secondary | ICD-10-CM | POA: Insufficient documentation

## 2015-06-02 DIAGNOSIS — I10 Essential (primary) hypertension: Secondary | ICD-10-CM | POA: Insufficient documentation

## 2015-06-02 DIAGNOSIS — J45909 Unspecified asthma, uncomplicated: Secondary | ICD-10-CM | POA: Diagnosis not present

## 2015-06-02 DIAGNOSIS — Z79899 Other long term (current) drug therapy: Secondary | ICD-10-CM | POA: Diagnosis not present

## 2015-06-02 DIAGNOSIS — Z8619 Personal history of other infectious and parasitic diseases: Secondary | ICD-10-CM | POA: Diagnosis not present

## 2015-06-02 DIAGNOSIS — M797 Fibromyalgia: Secondary | ICD-10-CM | POA: Diagnosis not present

## 2015-06-02 DIAGNOSIS — Z720 Tobacco use: Secondary | ICD-10-CM | POA: Insufficient documentation

## 2015-06-02 DIAGNOSIS — Z86018 Personal history of other benign neoplasm: Secondary | ICD-10-CM | POA: Insufficient documentation

## 2015-06-02 DIAGNOSIS — R519 Headache, unspecified: Secondary | ICD-10-CM

## 2015-06-02 DIAGNOSIS — G8929 Other chronic pain: Secondary | ICD-10-CM | POA: Insufficient documentation

## 2015-06-02 DIAGNOSIS — K219 Gastro-esophageal reflux disease without esophagitis: Secondary | ICD-10-CM | POA: Diagnosis not present

## 2015-06-02 DIAGNOSIS — R51 Headache: Secondary | ICD-10-CM | POA: Diagnosis not present

## 2015-06-02 LAB — BASIC METABOLIC PANEL
Anion gap: 9 (ref 5–15)
BUN: 8 mg/dL (ref 6–20)
CO2: 23 mmol/L (ref 22–32)
Calcium: 10.2 mg/dL (ref 8.9–10.3)
Chloride: 108 mmol/L (ref 101–111)
Creatinine, Ser: 0.71 mg/dL (ref 0.44–1.00)
GFR calc Af Amer: >60 mL/min (ref >60)
GFR calc non Af Amer: >60 mL/min (ref >60)
Glucose, Bld: 80 mg/dL (ref 65–99)
Potassium: 3.6 mmol/L (ref 3.5–5.1)
Sodium: 140 mmol/L (ref 135–145)

## 2015-06-02 LAB — SEDIMENTATION RATE: Sed Rate: 30 mm/hr — ABNORMAL HIGH (ref 0–22)

## 2015-06-02 MED ORDER — DIPHENHYDRAMINE HCL 50 MG/ML IJ SOLN
25.0000 mg | Freq: Once | INTRAMUSCULAR | Status: AC
  Administered 2015-06-02: 25 mg via INTRAVENOUS
  Filled 2015-06-02: qty 1

## 2015-06-02 MED ORDER — PROCHLORPERAZINE EDISYLATE 5 MG/ML IJ SOLN
10.0000 mg | Freq: Once | INTRAMUSCULAR | Status: AC
  Administered 2015-06-02: 10 mg via INTRAVENOUS
  Filled 2015-06-02: qty 2

## 2015-06-02 MED ORDER — KETOROLAC TROMETHAMINE 30 MG/ML IJ SOLN
30.0000 mg | Freq: Once | INTRAMUSCULAR | Status: AC
  Administered 2015-06-02: 30 mg via INTRAVENOUS
  Filled 2015-06-02: qty 1

## 2015-06-02 MED ORDER — PROMETHAZINE HCL 25 MG PO TABS: 25.0000 mg | ORAL_TABLET | Freq: Four times a day (QID) | ORAL | Status: DC | PRN

## 2015-06-02 MED ORDER — MAGNESIUM SULFATE 2 GM/50ML IV SOLN
2.0000 g | Freq: Once | INTRAVENOUS | Status: AC
  Administered 2015-06-02: 2 g via INTRAVENOUS
  Filled 2015-06-02: qty 50

## 2015-06-02 NOTE — Discharge Instructions (Signed)
General Headache Without Cause A headache is pain or discomfort felt around the head or neck area. There are many causes and types of headaches. In some cases, the cause may not be found.  HOME CARE  Managing Pain  Take over-the-counter and prescription medicines only as told by your doctor.  Lie down in a dark, quiet room when you have a headache.  If directed, apply ice to the head and neck area:  Put ice in a plastic bag.  Place a towel between your skin and the bag.  Leave the ice on for 20 minutes, 2-3 times per day.  Use a heating pad or hot shower to apply heat to the head and neck area as told by your doctor.  Keep lights dim if bright lights bother you or make your headaches worse. Eating and Drinking  Eat meals on a regular schedule.  Lessen how much alcohol you drink.  Lessen how much caffeine you drink, or stop drinking caffeine. General Instructions  Keep all follow-up visits as told by your doctor. This is important.  Keep a journal to find out if certain things bring on headaches. For example, write down:  What you eat and drink.  How much sleep you get.  Any change to your diet or medicines.  Relax by getting a massage or doing other relaxing activities.  Lessen stress.  Sit up straight. Do not tighten (tense) your muscles.  Do not use tobacco products. This includes cigarettes, chewing tobacco, or e-cigarettes. If you need help quitting, ask your doctor.  Exercise regularly as told by your doctor.  Get enough sleep. This often means 7-9 hours of sleep. GET HELP IF:  Your symptoms are not helped by medicine.  You have a headache that feels different than the other headaches.  You feel sick to your stomach (nauseous) or you throw up (vomit).  You have a fever. GET HELP RIGHT AWAY IF:   Your headache becomes really bad.  You keep throwing up.  You have a stiff neck.  You have trouble seeing.  You have trouble speaking.  You have  pain in the eye or ear.  Your muscles are weak or you lose muscle control.  You lose your balance or have trouble walking.  You feel like you will pass out (faint) or you pass out.  You have confusion.   This information is not intended to replace advice given to you by your health care provider. Make sure you discuss any questions you have with your health care provider.   Document Released: 04/27/2008 Document Revised: 04/09/2015 Document Reviewed: 11/11/2014 Elsevier Interactive Patient Education 2016 [place] 1 OVER-THE-COUNTER BENADRYL AND 1 PHENERGAN (PRESCRIPTION MEDICINE) AT THE FIRST SIGN OF A HEADACHE. THESE MEDICATIONS WILL MAKE YOU SLEEPY SO DO NOT DRIVE OR OPERATE MACHINERY WHILE TAKING THESE MEDICATIONS.

## 2015-06-02 NOTE — ED Provider Notes (Signed)
CSN: [id number]     Arrival date & time 06/02/15  0941 History   First MD Initiated Contact with Patient 06/02/15 1205     Chief Complaint  Patient presents with  . Headache    1200 yesterday     (Consider location/radiation/quality/duration/timing/severity/associated sxs/prior Treatment) HPI Comments: 59yo F w/ PMH including HTN, GERD, asthma, cirrhosis, chronic back pain who p/w headache. Pt states she began developing a headache yesterday at noon while in church. The headache is severe, 10/10 in intensity, constant, and located all over her head. She has had the same headache several times before. No sudden onset of worst headache of her life. She has taken Tylenol and ibuprofen without relief. Headache is worse with talking and she endorses photophobia. She has had some dry heaves but denies any fevers, rash, tick bites, or recent illness. No extremity numbness or weakness.  Patient is a 59 y.o. female presenting with headaches. The history is provided by the patient.  Headache   Past Medical History  Diagnosis Date  . Hypertension   . Fibroids   . [name]     HCV RNA positive 09/2012  . GERD (gastroesophageal reflux disease)   . H. pylori infection 2014    treated with pylera  . Asthma   . PONV (postoperative nausea and vomiting)     pt thinks maybe once she had N&V  . Cirrhosis ([place])     Metavir score F4 on elastography 2015  . Fibromyalgia   . Chronic back pain   . Sciatica of left side    Past Surgical History  Procedure Laterality Date  . Knee surgery      left knee  . Colonoscopy  01/18/2008    WER:[id number] rectum.  Long redundant colon, a diminutive sigmoid polyp status post cold biopsy removed. Hyperplastic polyp. Repeat colonoscopy June 2014 due to family history of colon cancer  . Esophagogastroduodenoscopy  01/18/2008    RMR: Normal esophagus, normal  stomach  . Colonoscopy with esophagogastroduodenoscopy (egd) N/A 11/02/2012    QPY:[id number] gastric mucosa  of doubtful, +H.pylori. Incomplete colonoscopy due to patient unable to tolerate exam, proximal colon seen. Patient refused ACBE.  . Colonoscopy with propofol N/A 03/08/2013    Dr. Gala Romney: colonic polyp-removed as scribed above. Internal Hemorrhoids. Pathology did not reveal any colonic tissue, only mucus. SURVEILLANCE DUE Aug 2019  . Polypectomy N/A 03/08/2013    Procedure: POLYPECTOMY;  Surgeon: Daneil Dolin, MD;  Location: [facility];  Service: Endoscopy;  Laterality: N/A;  . Multiple extractions with alveoloplasty N/A 10/15/2013    Procedure: MULTIPLE EXTRACTION WITH ALVEOLOPLASTY;  Surgeon: Gae Bon, DDS;  Location: [facility];  Service: Oral Surgery;  Laterality: N/A;  . Total knee arthroplasty Left 02/05/2015    Procedure: LEFT TOTAL KNEE ARTHROPLASTY;  Surgeon: Carole Civil, MD;  Location: [facility];  Service: Orthopedics;  Laterality: Left;   Family History  Problem Relation Age of Onset  . Colon cancer Brother        . Asthma      FH  . Multiple myeloma Brother   . Liver cancer Sister   . Prostate cancer Brother   . Pancreatic cancer Brother   . Cancer Other     niece, age 23, primary unknown   Social History  Substance Use Topics  . Smoking status: Current Every Day Smoker -- 1.00 packs/day for 40 years    Types: Cigarettes  . Smokeless tobacco: None     Comment: Smokes one  pack of cigarettes daily  . Alcohol Use: No     Comment: former, last Jan 2014   OB History    Gravida Para Term Preterm AB TAB SAB Ectopic Multiple Living            1     Review of Systems  Neurological: Positive for headaches.   10 Systems reviewed and are negative for acute change except as noted in the HPI.    Allergies  Penicillins  Home Medications   Prior to Admission medications   Medication Sig Start Date End Date Taking? Authorizing Provider  amLODipine (NORVASC) 5 MG tablet Take 5 mg by mouth daily.   Yes Historical Provider, MD  aspirin EC 325 MG EC tablet Take 1 tablet (325 mg  total) by mouth 2 (two) times daily. 02/08/15  Yes Carole Civil, MD  Cyanocobalamin (VITAMIN B 12 PO) Take 1 tablet by mouth daily.   Yes Historical Provider, MD  cyclobenzaprine (FLEXERIL) 10 MG tablet Take 10 mg by mouth daily. 12/30/14  Yes Historical Provider, MD  esomeprazole (NEXIUM) 40 MG capsule Take 1 capsule (40 mg total) by mouth every morning. 12/19/14  Yes Orvil Feil, NP  gabapentin (NEURONTIN) 100 MG capsule Take 1 capsule (100 mg total) by mouth 3 (three) times daily. 02/17/15  Yes Carole Civil, MD  ibuprofen (ADVIL,MOTRIN) 800 MG tablet Take 1 tablet (800 mg total) by mouth every 8 (eight) hours as needed. Patient taking differently: Take 800 mg by mouth every 8 (eight) hours as needed for mild pain.  03/17/15  Yes Carole Civil, MD  lisinopril (PRINIVIL,ZESTRIL) 20 MG tablet Take 20 mg by mouth daily.   Yes Historical Provider, MD  megestrol (MEGACE) 40 MG tablet Take 40 mg by mouth daily.   Yes Historical Provider, MD  tiZANidine (ZANAFLEX) 2 MG tablet Take 1 tablet (2 mg total) by mouth every 8 (eight) hours as needed. Patient taking differently: Take 2 mg by mouth every 8 (eight) hours as needed for muscle spasms.  02/08/15  Yes Carole Civil, MD  HYDROcodone-acetaminophen (NORCO) 7.5-325 MG tablet Take 1 tablet by mouth every 6 (six) hours as needed for moderate pain. Patient not taking: Reported on 06/02/2015 05/13/15   Carole Civil, MD  oxyCODONE-acetaminophen (PERCOCET) 5-325 MG per tablet Take 1 tablet by mouth every 4 (four) hours as needed. Patient not taking: Reported on 06/02/2015 04/01/15   Daleen Bo, MD  promethazine (PHENERGAN) 25 MG tablet Take 1 tablet (25 mg total) by mouth every 6 (six) hours as needed for nausea or vomiting. 06/02/15   Sharlett Iles, MD  rifabutin (MYCOBUTIN) 150 MG capsule Take 2 capsules (300 mg total) by mouth daily. Patient not taking: Reported on 04/22/2015 12/19/14   Orvil Feil, NP   BP 169/86 mmHg  Pulse 65   Temp(Src) 98.6 F (37 C) (Oral)  Resp 18  Ht $R'5\' 6"'JM$  (1.676 m)  Wt 178 lb (80.74 kg)  BMI 28.74 kg/m2  SpO2 87% Physical Exam  Constitutional: She is oriented to person, place, and time. She appears well-developed and well-nourished. No distress.  Awake, alert, sitting up in bed, tearful  HENT:  Head: Normocephalic and atraumatic.  Eyes: Conjunctivae and EOM are normal. Pupils are equal, round, and reactive to light.  Neck: Normal range of motion. Neck supple.  Cardiovascular: Normal rate, regular rhythm and normal heart sounds.   No murmur heard. Pulmonary/Chest: Effort normal and breath sounds normal. No respiratory distress.  Abdominal: Soft. Bowel sounds are normal. She exhibits no distension.  Musculoskeletal: She exhibits no edema.  5/5 strength and normal sensation throughout  Neurological: She is alert and oriented to person, place, and time. She has normal reflexes. No cranial nerve deficit. She exhibits normal muscle tone.  Fluent speech, normal finger-to-nose testing, negative pronator drift  Skin: Skin is warm and dry.  Psychiatric: She has a normal mood and affect. Judgment and thought content normal.  Nursing note and vitals reviewed.   ED Course  Procedures (including critical care time) Labs Review Labs Reviewed  SEDIMENTATION RATE - Abnormal; Notable for the following:    Sed Rate 30 (*)    All other components within normal limits  BASIC METABOLIC PANEL    Imaging Review No results found. I have personally reviewed and evaluated these lab results as part of my medical decision-making.   EKG Interpretation None     Medications  diphenhydrAMINE (BENADRYL) injection 25 mg (25 mg Intravenous Given 06/02/15 1240)  prochlorperazine (COMPAZINE) injection 10 mg (10 mg Intravenous Given 06/02/15 1239)  magnesium sulfate IVPB 2 g 50 mL (0 g Intravenous Stopped 06/02/15 1419)  ketorolac (TORADOL) 30 MG/ML injection 30 mg (30 mg Intravenous Given 06/02/15 1440)     MDM   Final diagnoses:  Nonintractable episodic headache, unspecified headache type    59 year old female who presents with 1 day of headache that is similar to previous headache episodes. Patient tearful at presentation. She was hypertensive at 734L systolic. No neurologic deficits on exam. Obtained ESR to rule out temporal arteritis. Gave the patient Benadryl, Compazine, and magnesium.  ESR is <50 and no visual complaints which makes temporal arteritis very unlikely. Creatinine normal, therefore gave Toradol.  The patient denies any neurologic symptoms such as visual changes, focal numbness/weakness, balance problems, confusion, or speech difficulty to suggest a life-threatening intracranial process such as intracranial hemorrhage or mass. The patient has no clotting risk factors thus venous sinus thrombosis is unlikely.  I feel that the patient is safe for discharge home without any head imaging at this time. On reexamination, the patient was sitting up and appeared more comfortable. Her blood pressure had improved. She requested Percocet for her headache which I declined to give her and then she requested refill of her chronic pain medication which I also declined. I did provide with Phenergan and instructions to take it with Benadryl at first sign of headache. I have reviewed return precautions including development of neurologic symptoms, confusion, lethargy, or difficulty speaking and patient has voiced understanding. Patient discharged in satisfactory condition.    Sharlett Iles, MD 06/02/15 [phone]

## 2015-06-02 NOTE — ED Notes (Signed)
Headache since yesterday about 1200.  Rates pain 10/.  Took tylenol and ibuprofen without relief.  Last dose of ibuprofen was at 0700.

## 2015-06-10 ENCOUNTER — Other Ambulatory Visit: Payer: Self-pay | Admitting: *Deleted

## 2015-06-10 ENCOUNTER — Telehealth: Payer: Self-pay | Admitting: *Deleted

## 2015-06-10 MED ORDER — HYDROCODONE-ACETAMINOPHEN 7.5-325 MG PO TABS: 1.0000 | ORAL_TABLET | Freq: Four times a day (QID) | ORAL | Status: DC | PRN

## 2015-06-10 NOTE — Telephone Encounter (Signed)
Prescription available, patient aware  

## 2015-06-10 NOTE — Telephone Encounter (Signed)
Patient would like to have her hydrocodone refilled. Please advise

## 2015-06-12 DIAGNOSIS — E784 Other hyperlipidemia: Secondary | ICD-10-CM | POA: Diagnosis not present

## 2015-06-12 DIAGNOSIS — I10 Essential (primary) hypertension: Secondary | ICD-10-CM | POA: Diagnosis not present

## 2015-07-02 ENCOUNTER — Emergency Department (HOSPITAL_COMMUNITY): Payer: Commercial Managed Care - HMO

## 2015-07-02 ENCOUNTER — Encounter (HOSPITAL_COMMUNITY): Payer: Self-pay | Admitting: *Deleted

## 2015-07-02 ENCOUNTER — Emergency Department (HOSPITAL_COMMUNITY)
Admission: EM | Admit: 2015-07-02 | Discharge: 2015-07-02 | Disposition: A | Discharge status: Home / Self Care | Payer: Commercial Managed Care - HMO | Attending: Emergency Medicine | Admitting: Emergency Medicine

## 2015-07-02 DIAGNOSIS — Z7982 Long term (current) use of aspirin: Secondary | ICD-10-CM | POA: Diagnosis not present

## 2015-07-02 DIAGNOSIS — Z8619 Personal history of other infectious and parasitic diseases: Secondary | ICD-10-CM | POA: Diagnosis not present

## 2015-07-02 DIAGNOSIS — F17210 Nicotine dependence, cigarettes, uncomplicated: Secondary | ICD-10-CM | POA: Insufficient documentation

## 2015-07-02 DIAGNOSIS — K219 Gastro-esophageal reflux disease without esophagitis: Secondary | ICD-10-CM | POA: Diagnosis not present

## 2015-07-02 DIAGNOSIS — Z9889 Other specified postprocedural states: Secondary | ICD-10-CM | POA: Insufficient documentation

## 2015-07-02 DIAGNOSIS — Z86018 Personal history of other benign neoplasm: Secondary | ICD-10-CM | POA: Diagnosis not present

## 2015-07-02 DIAGNOSIS — Z79899 Other long term (current) drug therapy: Secondary | ICD-10-CM | POA: Insufficient documentation

## 2015-07-02 DIAGNOSIS — J45909 Unspecified asthma, uncomplicated: Secondary | ICD-10-CM | POA: Diagnosis not present

## 2015-07-02 DIAGNOSIS — D259 Leiomyoma of uterus, unspecified: Secondary | ICD-10-CM | POA: Diagnosis not present

## 2015-07-02 DIAGNOSIS — G8929 Other chronic pain: Secondary | ICD-10-CM | POA: Diagnosis not present

## 2015-07-02 DIAGNOSIS — I10 Essential (primary) hypertension: Secondary | ICD-10-CM | POA: Insufficient documentation

## 2015-07-02 DIAGNOSIS — Z880 Allergy status to penicillin: Secondary | ICD-10-CM | POA: Insufficient documentation

## 2015-07-02 DIAGNOSIS — K529 Noninfective gastroenteritis and colitis, unspecified: Secondary | ICD-10-CM | POA: Insufficient documentation

## 2015-07-02 DIAGNOSIS — R112 Nausea with vomiting, unspecified: Secondary | ICD-10-CM | POA: Diagnosis present

## 2015-07-02 LAB — CBC
HCT: 43 % (ref 36.0–46.0)
Hemoglobin: 14.8 g/dL (ref 12.0–15.0)
MCH: 30.5 pg (ref 26.0–34.0)
MCHC: 34.4 g/dL (ref 30.0–36.0)
MCV: 88.7 fL (ref 78.0–100.0)
Platelets: 196 10*3/uL (ref 150–400)
RBC: 4.85 MIL/uL (ref 3.87–5.11)
RDW: 14.8 % (ref 11.5–15.5)
WBC: 8.4 10*3/uL (ref 4.0–10.5)

## 2015-07-02 LAB — URINE MICROSCOPIC-ADD ON: RBC / HPF: NONE SEEN RBC/hpf (ref 0–5)

## 2015-07-02 LAB — COMPREHENSIVE METABOLIC PANEL
ALT: 9 U/L — ABNORMAL LOW (ref 14–54)
AST: 13 U/L — ABNORMAL LOW (ref 15–41)
Albumin: 4.9 g/dL (ref 3.5–5.0)
Alkaline Phosphatase: 75 U/L (ref 38–126)
Anion gap: 11 (ref 5–15)
BUN: 6 mg/dL (ref 6–20)
CO2: 22 mmol/L (ref 22–32)
Calcium: 10.4 mg/dL — ABNORMAL HIGH (ref 8.9–10.3)
Chloride: 105 mmol/L (ref 101–111)
Creatinine, Ser: 0.69 mg/dL (ref 0.44–1.00)
GFR calc Af Amer: >60 mL/min (ref >60)
GFR calc non Af Amer: >60 mL/min (ref >60)
Glucose, Bld: 98 mg/dL (ref 65–99)
Potassium: 3.3 mmol/L — ABNORMAL LOW (ref 3.5–5.1)
Sodium: 138 mmol/L (ref 135–145)
Total Bilirubin: 1 mg/dL (ref 0.3–1.2)
Total Protein: 9.2 g/dL — ABNORMAL HIGH (ref 6.5–8.1)

## 2015-07-02 LAB — URINALYSIS, ROUTINE W REFLEX MICROSCOPIC
Bilirubin Urine: NEGATIVE
Glucose, UA: NEGATIVE mg/dL
Hgb urine dipstick: NEGATIVE
Ketones, ur: NEGATIVE mg/dL
Leukocytes, UA: NEGATIVE
Nitrite: NEGATIVE
Protein, ur: 30 mg/dL — AB
Specific Gravity, Urine: 1.025 (ref 1.005–1.030)
pH: 5.5 (ref 5.0–8.0)

## 2015-07-02 LAB — LIPASE, BLOOD: Lipase: 30 U/L (ref 11–51)

## 2015-07-02 MED ORDER — LOPERAMIDE HCL 2 MG PO TABS: 2.0000 mg | ORAL_TABLET | Freq: Four times a day (QID) | ORAL | Status: DC | PRN

## 2015-07-02 MED ORDER — ONDANSETRON HCL 4 MG/2ML IJ SOLN
4.0000 mg | Freq: Once | INTRAMUSCULAR | Status: AC
  Administered 2015-07-02: 4 mg via INTRAVENOUS
  Filled 2015-07-02: qty 2

## 2015-07-02 MED ORDER — ONDANSETRON 4 MG PO TBDP: 4.0000 mg | ORAL_TABLET | Freq: Three times a day (TID) | ORAL | Status: DC | PRN

## 2015-07-02 MED ORDER — SODIUM CHLORIDE 0.9 % IV SOLN: INTRAVENOUS | Status: DC

## 2015-07-02 MED ORDER — SODIUM CHLORIDE 0.9 % IV BOLUS (SEPSIS)
500.0000 mL | Freq: Once | INTRAVENOUS | Status: AC
  Administered 2015-07-02: 500 mL via INTRAVENOUS

## 2015-07-02 MED ORDER — HYDROCODONE-ACETAMINOPHEN 5-325 MG PO TABS: 1.0000 | ORAL_TABLET | Freq: Four times a day (QID) | ORAL | Status: DC | PRN

## 2015-07-02 MED ORDER — HYDROMORPHONE HCL 1 MG/ML IJ SOLN
1.0000 mg | Freq: Once | INTRAMUSCULAR | Status: AC
  Administered 2015-07-02: 1 mg via INTRAVENOUS
  Filled 2015-07-02: qty 1

## 2015-07-02 MED ORDER — IOHEXOL 300 MG/ML  SOLN
100.0000 mL | Freq: Once | INTRAMUSCULAR | Status: AC | PRN
  Administered 2015-07-02: 100 mL via INTRAVENOUS

## 2015-07-02 MED ORDER — DIATRIZOATE MEGLUMINE & SODIUM 66-10 % PO SOLN
ORAL | Status: AC
  Filled 2015-07-02: qty 30

## 2015-07-02 NOTE — ED Notes (Signed)
Attempted IV access x 2 without success, CN notified and will assess for IV

## 2015-07-02 NOTE — ED Notes (Signed)
Patient reports hypertension today. Also c/o abdominal pain that started yesterday and is sporadic in nature, vomited once today and having loose stools.

## 2015-07-02 NOTE — ED Provider Notes (Signed)
CSN: [id number]     Arrival date & time 07/02/15  1416 History   First MD Initiated Contact with Patient 07/02/15 1518     Chief Complaint  Patient presents with  . Hypertension; Abd pain      (Consider location/radiation/quality/duration/timing/severity/associated sxs/prior Treatment) The history is provided by the patient.   59 year old female acute onset yesterday of nausea vomiting and diarrhea associated with generalized abdominal pain that is severe at times. No fevers no blood in the bowel movements or the vomit. Patient without any sick exposure. Patient states that the pain is crampy in nature and severe currently 8 out of 10. Describes it as sharp pain in other times it's an ache. Pain is nonradiating. Not made better or worse by anything.  Past Medical History  Diagnosis Date  . Hypertension   . Fibroids   . [name]     HCV RNA positive 09/2012  . GERD (gastroesophageal reflux disease)   . H. pylori infection 2014    treated with pylera  . Asthma   . PONV (postoperative nausea and vomiting)     pt thinks maybe once she had N&V  . Cirrhosis ([place])     Metavir score F4 on elastography 2015  . Fibromyalgia   . Chronic back pain   . Sciatica of left side    Past Surgical History  Procedure Laterality Date  . Knee surgery      left knee  . Colonoscopy  01/18/2008    DZH:GDJMEQ rectum.  Long redundant colon, a diminutive sigmoid polyp status post cold biopsy removed. Hyperplastic polyp. Repeat colonoscopy June 2014 due to family history of colon cancer  . Esophagogastroduodenoscopy  01/18/2008    RMR: Normal esophagus, normal  stomach  . Colonoscopy with esophagogastroduodenoscopy (egd) N/A 11/02/2012    AST:[id number] gastric mucosa of doubtful, +H.pylori. Incomplete colonoscopy due to patient unable to tolerate exam, proximal colon seen. Patient refused ACBE.  . Colonoscopy with propofol N/A 03/08/2013    Dr. Gala Romney: colonic polyp-removed as scribed above. Internal  Hemorrhoids. Pathology did not reveal any colonic tissue, only mucus. SURVEILLANCE DUE Aug 2019  . Polypectomy N/A 03/08/2013    Procedure: POLYPECTOMY;  Surgeon: Daneil Dolin, MD;  Location: [facility];  Service: Endoscopy;  Laterality: N/A;  . Multiple extractions with alveoloplasty N/A 10/15/2013    Procedure: MULTIPLE EXTRACTION WITH ALVEOLOPLASTY;  Surgeon: Gae Bon, DDS;  Location: [facility];  Service: Oral Surgery;  Laterality: N/A;  . Total knee arthroplasty Left 02/05/2015    Procedure: LEFT TOTAL KNEE ARTHROPLASTY;  Surgeon: Carole Civil, MD;  Location: [facility];  Service: Orthopedics;  Laterality: Left;   Family History  Problem Relation Age of Onset  . Colon cancer Brother        . Asthma      FH  . Multiple myeloma Brother   . Liver cancer Sister   . Prostate cancer Brother   . Pancreatic cancer Brother   . Cancer Other     niece, age 2, primary unknown   Social History  Substance Use Topics  . Smoking status: Current Every Day Smoker -- 1.00 packs/day for 40 years    Types: Cigarettes  . Smokeless tobacco: None     Comment: Smokes one pack of cigarettes daily  . Alcohol Use: No     Comment: former, last Jan 2014   OB History    Gravida Para Term Preterm AB TAB SAB Ectopic Multiple Living  1     Review of Systems  Constitutional: Negative for fever.  HENT: Negative for congestion.   Eyes: Negative for redness.  Respiratory: Negative for shortness of breath.   Cardiovascular: Negative for chest pain.  Gastrointestinal: Positive for abdominal pain. Negative for nausea, vomiting, diarrhea and blood in stool.  Genitourinary: Negative for dysuria.  Musculoskeletal: Negative for back pain.  Neurological: Negative for headaches.  Hematological: Does not bruise/bleed easily.  Psychiatric/Behavioral: Negative for confusion.      Allergies  Penicillins  Home Medications   Prior to Admission medications   Medication Sig Start Date End Date Taking?  Authorizing Provider  amLODipine (NORVASC) 5 MG tablet Take 5 mg by mouth daily.   Yes Historical Provider, MD  aspirin EC 325 MG EC tablet Take 1 tablet (325 mg total) by mouth 2 (two) times daily. 02/08/15  Yes Carole Civil, MD  Cyanocobalamin (VITAMIN B 12 PO) Take 1 tablet by mouth daily.   Yes Historical Provider, MD  cyclobenzaprine (FLEXERIL) 10 MG tablet Take 10 mg by mouth daily. 12/30/14  Yes Historical Provider, MD  esomeprazole (NEXIUM) 40 MG capsule Take 1 capsule (40 mg total) by mouth every morning. 12/19/14  Yes Orvil Feil, NP  gabapentin (NEURONTIN) 100 MG capsule Take 1 capsule (100 mg total) by mouth 3 (three) times daily. 02/17/15  Yes Carole Civil, MD  HYDROcodone-acetaminophen (NORCO) 7.5-325 MG tablet Take 1 tablet by mouth every 6 (six) hours as needed for moderate pain. 06/10/15  Yes Carole Civil, MD  lisinopril (PRINIVIL,ZESTRIL) 20 MG tablet Take 20 mg by mouth daily.   Yes Historical Provider, MD  megestrol (MEGACE) 40 MG tablet Take 40 mg by mouth daily.   Yes Historical Provider, MD  HYDROcodone-acetaminophen (NORCO/VICODIN) 5-325 MG tablet Take 1-2 tablets by mouth every 6 (six) hours as needed. 07/02/15   Fredia Sorrow, MD  ibuprofen (ADVIL,MOTRIN) 800 MG tablet Take 1 tablet (800 mg total) by mouth every 8 (eight) hours as needed. Patient taking differently: Take 800 mg by mouth every 8 (eight) hours as needed for mild pain.  03/17/15   Carole Civil, MD  loperamide (IMODIUM A-D) 2 MG tablet Take 1 tablet (2 mg total) by mouth 4 (four) times daily as needed for diarrhea or loose stools. 07/02/15   Fredia Sorrow, MD  ondansetron (ZOFRAN ODT) 4 MG disintegrating tablet Take 1 tablet (4 mg total) by mouth every 8 (eight) hours as needed. 07/02/15   Fredia Sorrow, MD  oxyCODONE-acetaminophen (PERCOCET) 5-325 MG per tablet Take 1 tablet by mouth every 4 (four) hours as needed. Patient not taking: Reported on 06/02/2015 04/01/15   Daleen Bo, MD   promethazine (PHENERGAN) 25 MG tablet Take 1 tablet (25 mg total) by mouth every 6 (six) hours as needed for nausea or vomiting. 06/02/15   Sharlett Iles, MD  rifabutin (MYCOBUTIN) 150 MG capsule Take 2 capsules (300 mg total) by mouth daily. Patient not taking: Reported on 04/22/2015 12/19/14   Orvil Feil, NP  tiZANidine (ZANAFLEX) 2 MG tablet Take 1 tablet (2 mg total) by mouth every 8 (eight) hours as needed. Patient taking differently: Take 2 mg by mouth every 8 (eight) hours as needed for muscle spasms.  02/08/15   Carole Civil, MD   BP 148/89 mmHg  Pulse 77  Temp(Src) 98.3 F (36.8 C) (Oral)  Resp 13  Ht $R'5\' 6"'rb$  (1.676 m)  Wt 77.282 kg  BMI 27.51 kg/m2  SpO2 97% Physical Exam  Constitutional: She is oriented to person, place, and time. She appears well-developed and well-nourished. No distress.  HENT:  Head: Normocephalic and atraumatic.  Mouth/Throat: Oropharynx is clear and moist.  Eyes: Conjunctivae and EOM are normal. Pupils are equal, round, and reactive to light.  Neck: Normal range of motion. Neck supple.  Cardiovascular: Normal rate, regular rhythm and normal heart sounds.   No murmur heard. Pulmonary/Chest: Effort normal and breath sounds normal. No respiratory distress.  Abdominal: Soft. Bowel sounds are normal. She exhibits distension. There is no tenderness.  Musculoskeletal: Normal range of motion. She exhibits no edema.  Neurological: She is alert and oriented to person, place, and time. No cranial nerve deficit. She exhibits normal muscle tone. Coordination normal.  Skin: Skin is warm.  Nursing note and vitals reviewed.   ED Course  Procedures (including critical care time) Labs Review Labs Reviewed  COMPREHENSIVE METABOLIC PANEL - Abnormal; Notable for the following:    Potassium 3.3 (*)    Calcium 10.4 (*)    Total Protein 9.2 (*)    AST 13 (*)    ALT 9 (*)    All other components within normal limits  URINALYSIS, ROUTINE W REFLEX  MICROSCOPIC (NOT AT [facility]) - Abnormal; Notable for the following:    Protein, ur 30 (*)    All other components within normal limits  URINE MICROSCOPIC-ADD ON - Abnormal; Notable for the following:    Squamous Epithelial / LPF 6-30 (*)    Bacteria, UA FEW (*)    All other components within normal limits  LIPASE, BLOOD  CBC   Results for orders placed or performed during the hospital encounter of 07/02/15  Lipase, blood  Result Value Ref Range   Lipase 30 11 - 51 U/L  Comprehensive metabolic panel  Result Value Ref Range   Sodium 138 135 - 145 mmol/L   Potassium 3.3 (L) 3.5 - 5.1 mmol/L   Chloride 105 101 - 111 mmol/L   CO2 22 22 - 32 mmol/L   Glucose, Bld 98 65 - 99 mg/dL   BUN 6 6 - 20 mg/dL   Creatinine, Ser 0.69 0.44 - 1.00 mg/dL   Calcium 10.4 (H) 8.9 - 10.3 mg/dL   Total Protein 9.2 (H) 6.5 - 8.1 g/dL   Albumin 4.9 3.5 - 5.0 g/dL   AST 13 (L) 15 - 41 U/L   ALT 9 (L) 14 - 54 U/L   Alkaline Phosphatase 75 38 - 126 U/L   Total Bilirubin 1.0 0.3 - 1.2 mg/dL   GFR calc non Af Amer >60 >60 mL/min   GFR calc Af Amer >60 >60 mL/min   Anion gap 11 5 - 15  CBC  Result Value Ref Range   WBC 8.4 4.0 - 10.5 K/uL   RBC 4.85 3.87 - 5.11 MIL/uL   Hemoglobin 14.8 12.0 - 15.0 g/dL   HCT 43.0 36.0 - 46.0 %   MCV 88.7 78.0 - 100.0 fL   MCH 30.5 26.0 - 34.0 pg   MCHC 34.4 30.0 - 36.0 g/dL   RDW 14.8 11.5 - 15.5 %   Platelets 196 150 - 400 K/uL  Urinalysis, Routine w reflex microscopic (not at [facility])  Result Value Ref Range   Color, Urine YELLOW YELLOW   APPearance CLEAR CLEAR   Specific Gravity, Urine 1.025 1.005 - 1.030   pH 5.5 5.0 - 8.0   Glucose, UA NEGATIVE NEGATIVE mg/dL   Hgb urine dipstick NEGATIVE NEGATIVE   Bilirubin Urine NEGATIVE NEGATIVE  Ketones, ur NEGATIVE NEGATIVE mg/dL   Protein, ur 30 (A) NEGATIVE mg/dL   Nitrite NEGATIVE NEGATIVE   Leukocytes, UA NEGATIVE NEGATIVE  Urine microscopic-add on  Result Value Ref Range   Squamous Epithelial / LPF 6-30 (A) NONE  SEEN   WBC, UA 0-5 0 - 5 WBC/hpf   RBC / HPF NONE SEEN 0 - 5 RBC/hpf   Bacteria, UA FEW (A) NONE SEEN   Urine-Other YEAST PRESENT      Imaging Review Ct Abdomen Pelvis W Contrast  07/02/2015  CLINICAL DATA:  Mid abdominal pain since yesterday. Vomiting and diarrhea. EXAM: CT ABDOMEN AND PELVIS WITH CONTRAST TECHNIQUE: Multidetector CT imaging of the abdomen and pelvis was performed using the standard protocol following bolus administration of intravenous contrast. CONTRAST:  179mL OMNIPAQUE IOHEXOL 300 MG/ML  SOLN COMPARISON:  03/23/2012, 11/27/2012, 04/01/2015 FINDINGS: Lower chest:  Clear lung bases.  Normal heart size. Hepatobiliary: No focal hepatic abnormality. Normal gallbladder. No intrahepatic or extrahepatic biliary ductal dilatation. Pancreas: Normal. Spleen: Normal. Adrenals/Urinary Tract: Normal adrenal glands. Normal kidneys. Partially decompressed bladder without focal abnormality. Stomach/Bowel: No bowel dilatation. Few small bowel air-fluid levels with mild small bowel wall thickening as can be seen with enteritis. No pneumatosis, pneumoperitoneum or portal venous gas. Normal appendix. Small amount of pelvic free fluid. Vascular/Lymphatic: Normal caliber abdominal aorta with atherosclerosis. No abdominal or pelvic lymphadenopathy. Reproductive: Calcified uterine mass most consistent with a uterine fibroid. No adnexal mass. Other: No fluid collection or hematoma. Musculoskeletal: No lytic or sclerotic osseous lesion. No acute osseous abnormality. Mild osteoarthritis of bilateral hips. IMPRESSION: 1. Few small bowel air-fluid levels with mild small bowel wall thickening as can be seen with enteritis. 2. Uterine fibroid. Electronically Signed   By: Kathreen Devoid   On: 07/02/2015 17:38   I have personally reviewed and evaluated these images and lab results as part of my medical decision-making.   EKG Interpretation None      MDM   Final diagnoses:  Gastroenteritis    CT scan  without any acute findings. Symptoms with the nausea vomiting and diarrhea suggestive of viral gastritis. Patient nontoxic no acute distress. Patient improved here with medications. Patient we discharged home with symptomatic treatment and follow-up with her primary care doctor.    Fredia Sorrow, MD 07/02/15 1754

## 2015-07-02 NOTE — Discharge Instructions (Signed)
Food Choices to Help Relieve Diarrhea, Adult When you have diarrhea, the foods you eat and your eating habits are very important. Choosing the right foods and drinks can help relieve diarrhea. Also, because diarrhea can last up to 7 days, you need to replace lost fluids and electrolytes (such as sodium, potassium, and chloride) in order to help prevent dehydration.  WHAT GENERAL GUIDELINES DO I NEED TO FOLLOW?  Slowly drink 1 cup (8 oz) of fluid for each episode of diarrhea. If you are getting enough fluid, your urine will be clear or pale yellow.  Eat starchy foods. Some good choices include white rice, white toast, pasta, low-fiber cereal, baked potatoes (without the skin), saltine crackers, and bagels.  Avoid large servings of any cooked vegetables.  Limit fruit to two servings per day. A serving is  cup or 1 small piece.  Choose foods with less than 2 g of fiber per serving.  Limit fats to less than 8 tsp (38 g) per day.  Avoid fried foods.  Eat foods that have probiotics in them. Probiotics can be found in certain dairy products.  Avoid foods and beverages that may increase the speed at which food moves through the stomach and intestines (gastrointestinal tract). Things to avoid include:  High-fiber foods, such as dried fruit, raw fruits and vegetables, nuts, seeds, and whole grain foods.  Spicy foods and high-fat foods.  Foods and beverages sweetened with high-fructose corn syrup, honey, or sugar alcohols such as xylitol, sorbitol, and mannitol. WHAT FOODS ARE RECOMMENDED? Grains White rice. White, [place], or pita breads (fresh or toasted), including plain rolls, buns, or bagels. White pasta. Saltine, soda, or graham crackers. Pretzels. Low-fiber cereal. Cooked cereals made with water (such as cornmeal, farina, or cream cereals). Plain muffins. Matzo. Melba toast. Zwieback.  Vegetables Potatoes (without the skin). Strained tomato and vegetable juices. Most well-cooked and canned  vegetables without seeds. Tender lettuce. Fruits Cooked or canned applesauce, apricots, cherries, fruit cocktail, grapefruit, peaches, pears, or plums. Fresh bananas, apples without skin, cherries, grapes, cantaloupe, grapefruit, peaches, oranges, or plums.  Meat and Other Protein Products Baked or boiled chicken. Eggs. Tofu. Fish. Seafood. Smooth peanut butter. Ground or well-cooked tender beef, ham, veal, lamb, pork, or poultry.  Dairy Plain yogurt, kefir, and unsweetened liquid yogurt. Lactose-free milk, buttermilk, or soy milk. Plain hard cheese. Beverages Sport drinks. Clear broths. Diluted fruit juices (except prune). Regular, caffeine-free sodas such as ginger ale. Water. Decaffeinated teas. Oral rehydration solutions. Sugar-free beverages not sweetened with sugar alcohols. Other Bouillon, broth, or soups made from recommended foods.  The items listed above may not be a complete list of recommended foods or beverages. Contact your dietitian for more options. WHAT FOODS ARE NOT RECOMMENDED? Grains Whole grain, whole wheat, bran, or rye breads, rolls, pastas, crackers, and cereals. Wild or brown rice. Cereals that contain more than 2 g of fiber per serving. Corn tortillas or taco shells. Cooked or dry oatmeal. Granola. Popcorn. Vegetables Raw vegetables. Cabbage, broccoli, Brussels sprouts, artichokes, baked beans, beet greens, corn, kale, legumes, peas, sweet potatoes, and yams. Potato skins. Cooked spinach and cabbage. Fruits Dried fruit, including raisins and dates. Raw fruits. Stewed or dried prunes. Fresh apples with skin, apricots, mangoes, pears, raspberries, and strawberries.  Meat and Other Protein Products Chunky peanut butter. Nuts and seeds. Beans and lentils. Berniece Salines.  Dairy High-fat cheeses. Milk, chocolate milk, and beverages made with milk, such as milk shakes. Cream. Ice cream. Sweets and Desserts Sweet rolls, doughnuts, and sweet breads.  Pancakes and waffles. Fats and  Oils Butter. Cream sauces. Margarine. Salad oils. Plain salad dressings. Olives. Avocados.  Beverages Caffeinated beverages (such as coffee, tea, soda, or energy drinks). Alcoholic beverages. Fruit juices with pulp. Prune juice. Soft drinks sweetened with high-fructose corn syrup or sugar alcohols. Other Coconut. Hot sauce. Chili powder. Mayonnaise. Gravy. Cream-based or milk-based soups.  The items listed above may not be a complete list of foods and beverages to avoid. Contact your dietitian for more information. WHAT SHOULD I DO IF I BECOME DEHYDRATED? Diarrhea can sometimes lead to dehydration. Signs of dehydration include dark urine and dry mouth and skin. If you think you are dehydrated, you should rehydrate with an oral rehydration solution. These solutions can be purchased at pharmacies, retail stores, or online.  Drink -1 cup (120-240 mL) of oral rehydration solution each time you have an episode of diarrhea. If drinking this amount makes your diarrhea worse, try drinking smaller amounts more often. For example, drink 1-3 tsp (5-15 mL) every 5-10 minutes.  A general rule for staying hydrated is to drink 1-2 L of fluid per day. Talk to your health care provider about the specific amount you should be drinking each day. Drink enough fluids to keep your urine clear or pale yellow.   This information is not intended to replace advice given to you by your health care provider. Make sure you discuss any questions you have with your health care provider.  Take medications as directed. Follow-up with not improved in 2 days. Return for any new or worse symptoms. Today's workup seems it's just that this is a viral gastroenteritis.   Document Released: 10/09/2003 Document Revised: 08/09/2014 Document Reviewed: 06/11/2013 Elsevier Interactive Patient Education [employer].

## 2015-07-04 DIAGNOSIS — F17200 Nicotine dependence, unspecified, uncomplicated: Secondary | ICD-10-CM | POA: Diagnosis not present

## 2015-07-04 DIAGNOSIS — M1712 Unilateral primary osteoarthritis, left knee: Secondary | ICD-10-CM | POA: Diagnosis not present

## 2015-07-04 DIAGNOSIS — J410 Simple chronic bronchitis: Secondary | ICD-10-CM | POA: Diagnosis not present

## 2015-07-04 DIAGNOSIS — I10 Essential (primary) hypertension: Secondary | ICD-10-CM | POA: Diagnosis not present

## 2015-07-14 ENCOUNTER — Other Ambulatory Visit: Payer: Self-pay | Admitting: *Deleted

## 2015-07-14 ENCOUNTER — Telehealth: Payer: Self-pay | Admitting: Orthopedic Surgery

## 2015-07-14 MED ORDER — HYDROCODONE-ACETAMINOPHEN 7.5-325 MG PO TABS: 1.0000 | ORAL_TABLET | Freq: Four times a day (QID) | ORAL | Status: DC | PRN

## 2015-07-14 NOTE — Telephone Encounter (Signed)
Patient called to request medication refill: HYDROcodone-acetaminophen (NORCO/VICODIN) 5-325 MG tablet [id number] - ph# [phone]

## 2015-07-14 NOTE — Telephone Encounter (Signed)
Prescription available, patient aware  

## 2015-07-14 NOTE — Telephone Encounter (Signed)
Patient picked up prescription.

## 2015-07-29 ENCOUNTER — Ambulatory Visit (INDEPENDENT_AMBULATORY_CARE_PROVIDER_SITE_OTHER): Payer: Commercial Managed Care - HMO | Admitting: Orthopedic Surgery

## 2015-07-29 VITALS — BP 120/71 | Ht 66.0 in | Wt 175.0 lb

## 2015-07-29 DIAGNOSIS — M5442 Lumbago with sciatica, left side: Secondary | ICD-10-CM | POA: Diagnosis not present

## 2015-07-29 DIAGNOSIS — Z96652 Presence of left artificial knee joint: Secondary | ICD-10-CM | POA: Diagnosis not present

## 2015-07-29 DIAGNOSIS — G894 Chronic pain syndrome: Secondary | ICD-10-CM | POA: Diagnosis not present

## 2015-07-29 MED ORDER — IBUPROFEN 800 MG PO TABS: 800.0000 mg | ORAL_TABLET | Freq: Three times a day (TID) | ORAL | Status: DC | PRN

## 2015-07-29 MED ORDER — CYCLOBENZAPRINE HCL 10 MG PO TABS: 10.0000 mg | ORAL_TABLET | Freq: Three times a day (TID) | ORAL | Status: DC

## 2015-07-29 NOTE — Progress Notes (Signed)
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 ibuprofen (ADVIL,MOTRIN) 800 MG tablet    Sig: Take 1 tablet (800 mg total) by mouth every 8 (eight) hours as needed.    Dispense:  90 tablet    Refill:  5  . cyclobenzaprine (FLEXERIL) 10 MG tablet    Sig: Take 1 tablet (10 mg total) by mouth 3 (three) times daily.    Dispense:  90 tablet    Refill:  5

## 2015-08-11 ENCOUNTER — Other Ambulatory Visit: Payer: Self-pay | Admitting: *Deleted

## 2015-08-11 ENCOUNTER — Telehealth: Payer: Self-pay | Admitting: Orthopedic Surgery

## 2015-08-11 MED ORDER — HYDROCODONE-ACETAMINOPHEN 7.5-325 MG PO TABS: 1.0000 | ORAL_TABLET | Freq: Four times a day (QID) | ORAL | Status: DC | PRN

## 2015-08-11 NOTE — Telephone Encounter (Signed)
Prescription available, patient aware  

## 2015-08-12 DIAGNOSIS — I10 Essential (primary) hypertension: Secondary | ICD-10-CM | POA: Diagnosis not present

## 2015-08-12 DIAGNOSIS — K219 Gastro-esophageal reflux disease without esophagitis: Secondary | ICD-10-CM | POA: Diagnosis not present

## 2015-08-12 DIAGNOSIS — J40 Bronchitis, not specified as acute or chronic: Secondary | ICD-10-CM | POA: Diagnosis not present

## 2015-09-09 ENCOUNTER — Other Ambulatory Visit: Payer: Self-pay | Admitting: *Deleted

## 2015-09-09 ENCOUNTER — Telehealth: Payer: Self-pay | Admitting: Orthopedic Surgery

## 2015-09-09 MED ORDER — HYDROCODONE-ACETAMINOPHEN 7.5-325 MG PO TABS: 1.0000 | ORAL_TABLET | Freq: Four times a day (QID) | ORAL | Status: DC | PRN

## 2015-09-09 NOTE — Telephone Encounter (Signed)
Patient requests refill on medication:  (Ph# [phone])  HYDROcodone-acetaminophen (NORCO) 7.5-325 MG tablet [id number]

## 2015-09-10 NOTE — Telephone Encounter (Signed)
Prescription available, patient aware  

## 2015-09-10 NOTE — Telephone Encounter (Signed)
Patient picked up prescription.

## 2015-09-12 DIAGNOSIS — F17200 Nicotine dependence, unspecified, uncomplicated: Secondary | ICD-10-CM | POA: Diagnosis not present

## 2015-09-12 DIAGNOSIS — I10 Essential (primary) hypertension: Secondary | ICD-10-CM | POA: Diagnosis not present

## 2015-09-22 ENCOUNTER — Ambulatory Visit (INDEPENDENT_AMBULATORY_CARE_PROVIDER_SITE_OTHER): Payer: Commercial Managed Care - HMO | Admitting: Gastroenterology

## 2015-09-22 ENCOUNTER — Other Ambulatory Visit: Payer: Self-pay

## 2015-09-22 ENCOUNTER — Encounter: Payer: Self-pay | Admitting: Gastroenterology

## 2015-09-22 VITALS — BP 123/81 | HR 79 | Temp 98.9°F | Ht 66.0 in | Wt 181.8 lb

## 2015-09-22 DIAGNOSIS — K7460 Unspecified cirrhosis of liver: Secondary | ICD-10-CM

## 2015-09-22 NOTE — Assessment & Plan Note (Signed)
60 year old female with history of Hep C genotype 1a  s/p treatment with Harvoni (Aug 2015 started, completing a 12 week course) and negative HCV RNA PCR at 3 months post-treatment in March 2016. Needs repeat Hep C RNA now to document SVR. As last EGD was completed for variceal screening in 2014, due for routine screening now. Up-to-date on [place] screening and due again in May 2017. Well-compensated.   Proceed with upper endoscopy in the near future with Dr. Gala Romney. The risks, benefits, and alternatives have been discussed in detail with patient. They have stated understanding and desire to proceed.  PROPOFOL due to polypharmacy US abdomen in May 2017 Hep B vaccination prescription provided (Hep A immune) Check CMP, INR, Hep C RNA to document SVR 6 month return Next colonoscopy in 2019

## 2015-09-22 NOTE — Progress Notes (Signed)
Referring Provider: Rosita Fire, MD Primary Care Physician:  Rosita Fire, MD  Primary GI: Dr. Gala Romney   Chief Complaint  Patient presents with  . Follow-up    HPI:   Colleen Lee is a 60 y.o. female presenting today with a history of Hep C genotype 1a  s/p treatment with Harvoni (Aug 2015 started, completing a 12 week course) and negative HCV RNA PCR at 3 months post-treatment in March 2016. Will need a repeat viral load now. Routine follow-up for cirrhosis. Needs EGD for variceal screening, as last was in 2014. Needs US abdomen in May 2017. Next colonoscopy 2019.   No abdominal pain. Likes Ranch potato chips. When eating it, has to burp and sit up for awhile before laying down. Nexium once a day. No dysphagia. No concerning lower GI symptoms.   Past Medical History  Diagnosis Date  . Hypertension   . Fibroids   . [name]     HCV RNA positive 09/2012  . GERD (gastroesophageal reflux disease)   . H. pylori infection 2014    treated with pylera, had to be treated again as it was not eradicated. Urea breath test then negative after subsequent treatment.   . Asthma   . PONV (postoperative nausea and vomiting)     pt thinks maybe once she had N&V  . Cirrhosis ([place])     Metavir score F4 on elastography 2015  . Fibromyalgia   . Chronic back pain   . Sciatica of left side     Past Surgical History  Procedure Laterality Date  . Knee surgery      left knee  . Colonoscopy  01/18/2008    [id number] rectum.  Long redundant colon, a diminutive sigmoid polyp status post cold biopsy removed. Hyperplastic polyp. Repeat colonoscopy June 2014 due to family history of colon cancer  . Esophagogastroduodenoscopy  01/18/2008    RMR: Normal esophagus, normal  stomach  . Colonoscopy with esophagogastroduodenoscopy (egd) N/A 11/02/2012    [id number]:[id number] gastric mucosa of doubtful, +H.pylori. Incomplete colonoscopy due to patient unable to tolerate exam, proximal colon seen. Patient refused  ACBE.  . Colonoscopy with propofol N/A 03/08/2013    Dr. Gala Romney: colonic polyp-removed as scribed above. Internal Hemorrhoids. Pathology did not reveal any colonic tissue, only mucus. SURVEILLANCE DUE Aug 2019  . Polypectomy N/A 03/08/2013    Procedure: POLYPECTOMY;  Surgeon: Daneil Dolin, MD;  Location: [facility];  Service: Endoscopy;  Laterality: N/A;  . Multiple extractions with alveoloplasty N/A 10/15/2013    Procedure: MULTIPLE EXTRACTION WITH ALVEOLOPLASTY;  Surgeon: Gae Bon, DDS;  Location: [facility];  Service: Oral Surgery;  Laterality: N/A;  . Total knee arthroplasty Left 02/05/2015    Procedure: LEFT TOTAL KNEE ARTHROPLASTY;  Surgeon: Carole Civil, MD;  Location: [facility];  Service: Orthopedics;  Laterality: Left;    Current Outpatient Prescriptions  Medication Sig Dispense Refill  . amLODipine (NORVASC) 5 MG tablet Take 5 mg by mouth daily.    Marland Kitchen aspirin EC 325 MG EC tablet Take 1 tablet (325 mg total) by mouth 2 (two) times daily. 60 tablet 0  . Cyanocobalamin (VITAMIN B 12 PO) Take 1 tablet by mouth daily.    . cyclobenzaprine (FLEXERIL) 10 MG tablet Take 1 tablet (10 mg total) by mouth 3 (three) times daily. 90 tablet 5  . esomeprazole (NEXIUM) 40 MG capsule Take 1 capsule (40 mg total) by mouth every morning. 10 capsule 0  . HYDROcodone-acetaminophen (NORCO) 7.5-325 MG tablet  Take 1 tablet by mouth every 6 (six) hours as needed for moderate pain. 112 tablet 0  . ibuprofen (ADVIL,MOTRIN) 800 MG tablet Take 1 tablet (800 mg total) by mouth every 8 (eight) hours as needed. 90 tablet 5  . loperamide (IMODIUM A-D) 2 MG tablet Take 1 tablet (2 mg total) by mouth 4 (four) times daily as needed for diarrhea or loose stools. 30 tablet 0  . megestrol (MEGACE) 40 MG tablet Take 40 mg by mouth daily.    . ondansetron (ZOFRAN ODT) 4 MG disintegrating tablet Take 1 tablet (4 mg total) by mouth every 8 (eight) hours as needed. 10 tablet 1  . oxyCODONE-acetaminophen (PERCOCET) 5-325 MG per tablet  Take 1 tablet by mouth every 4 (four) hours as needed. 15 tablet 0  . promethazine (PHENERGAN) 25 MG tablet Take 1 tablet (25 mg total) by mouth every 6 (six) hours as needed for nausea or vomiting. 8 tablet 0  . tiZANidine (ZANAFLEX) 2 MG tablet Take 1 tablet (2 mg total) by mouth every 8 (eight) hours as needed. (Patient taking differently: Take 2 mg by mouth every 8 (eight) hours as needed for muscle spasms. ) 60 tablet 0   No current facility-administered medications for this visit.    Allergies as of 09/22/2015 - Review Complete 09/22/2015  Allergen Reaction Noted  . Penicillins Rash     Family History  Problem Relation Age of Onset  . Colon cancer Brother        . Asthma      FH  . Multiple myeloma Brother   . Liver cancer Sister   . Prostate cancer Brother   . Pancreatic cancer Brother   . Cancer Other     niece, age 31, primary unknown    Social History   Social History  . Marital Status: Married    Spouse Name: N/A  . Number of Children: 0  . Years of Education: N/A   Occupational History  . umemployed     Social History Main Topics  . Smoking status: Current Every Day Smoker -- 1.00 packs/day for 40 years    Types: Cigarettes  . Smokeless tobacco: None     Comment: Smokes one pack of cigarettes daily  . Alcohol Use: No     Comment: former, last Jan 2014  . Drug Use: No     Comment: history of marijuana in past  . Sexual Activity: No   Other Topics Concern  . None   Social History Narrative    Review of Systems: As mentioned in HPI.   Physical Exam: BP 123/81 mmHg  Pulse 79  Temp(Src) 98.9 F (37.2 C) (Oral)  Ht '5\' 6"'$  (1.676 m)  Wt 181 lb 12.8 oz (82.464 kg)  BMI 29.36 kg/m2 General:   Alert and oriented. No distress noted. Pleasant and cooperative.  Head:  Normocephalic and atraumatic. Eyes:  Conjuctiva clear without scleral icterus. Heart:  S1, S2 present without murmurs, rubs, or gallops. Regular rate and rhythm. Abdomen:  +BS, soft,  round, difficult to appreciate HSM. No ascites.  Msk:  Symmetrical without gross deformities. Normal posture. Extremities:  Without edema. Neurologic:  Alert and  oriented x4;  grossly normal neurologically. Psych:  Alert and cooperative. Normal mood and affect.

## 2015-09-22 NOTE — Progress Notes (Signed)
EGD PA# [id number]

## 2015-09-22 NOTE — Progress Notes (Signed)
cc'ed to pcp °

## 2015-09-22 NOTE — Patient Instructions (Signed)
Please have blood work done today.   You will have an ultrasound of your liver for routine screening in May 2017. We will arrange that when it gets closer.   We have arranged an upper endoscopy with Dr. Gala Romney for screening of any enlarged blood vessels in your esophagus.   I will see you back in 6 months. Please take the vaccination prescription to Dr. Legrand Rams to get started. You do not need Hepatitis A vaccination, as you are immune to this.

## 2015-09-23 LAB — COMPLETE METABOLIC PANEL WITH GFR
ALT: 7 U/L (ref 6–29)
AST: 11 U/L (ref 10–35)
Albumin: 4.7 g/dL (ref 3.6–5.1)
Alkaline Phosphatase: 65 U/L (ref 33–130)
BUN: 6 mg/dL — ABNORMAL LOW (ref 7–25)
CO2: 17 mmol/L — ABNORMAL LOW (ref 20–31)
Calcium: 10.1 mg/dL (ref 8.6–10.4)
Chloride: 107 mmol/L (ref 98–110)
Creat: 0.79 mg/dL (ref 0.50–0.99)
GFR, Est African American: >89 mL/min (ref >=60)
GFR, Est Non African American: 82 mL/min (ref >=60)
Glucose, Bld: 95 mg/dL (ref 65–99)
Potassium: 4.4 mmol/L (ref 3.5–5.3)
Sodium: 136 mmol/L (ref 135–146)
Total Bilirubin: 0.7 mg/dL (ref 0.2–1.2)
Total Protein: 8.4 g/dL — ABNORMAL HIGH (ref 6.1–8.1)

## 2015-09-23 LAB — HEPATITIS C RNA QUANTITATIVE: HCV Quantitative: NOT DETECTED IU/mL (ref <15)

## 2015-09-23 LAB — PROTIME-INR
INR: 1.11 (ref <1.50)
Prothrombin Time: 14.4 seconds (ref 11.6–15.2)

## 2015-09-30 NOTE — Progress Notes (Signed)
Quick Note:  I AM SO PROUD OF HER! She has had sustained virologic response (SVR) after treatment with Harvoni. She is officially cured. MELD 8. She is well-compensated. ______

## 2015-10-03 NOTE — Patient Instructions (Signed)
Colleen Lee  10/03/2015     @PREFPERIOPPHARMACY @   Your procedure is scheduled on 10/09/2015.  Report to [facility] at 10:45 A.M.  Call this number if you have problems the morning of surgery:  [phone]   Remember:  Do not eat food or drink liquids after midnight.  Take these medicines the morning of surgery with A SIP OF WATER Norvasc, Flexeril, Nexium, Norco and Zofran   Do not wear jewelry, make-up or nail polish.  Do not wear lotions, powders, or perfumes.  You may wear deodorant.  Do not shave 48 hours prior to surgery.  Men may shave face and neck.  Do not bring valuables to the hospital.  [facility] is not responsible for any belongings or valuables.  Contacts, dentures or bridgework may not be worn into surgery.  Leave your suitcase in the car.  After surgery it may be brought to your room.  For patients admitted to the hospital, discharge time will be determined by your treatment team.  Patients discharged the day of surgery will not be allowed to drive home.   Name and phone number of your driver:   Family Special instructions:  N/a  Please read over the following fact sheets that you were given. Care and Recovery After Surgery    General Anesthesia, Adult General anesthesia is a sleep-like state of non-feeling produced by medicines (anesthetics). General anesthesia prevents you from being alert and feeling pain during a medical procedure. Your caregiver may recommend general anesthesia if your procedure:  Is long.  Is painful or uncomfortable.  Would be frightening to see or hear.  Requires you to be still.  Affects your breathing.  Causes significant blood loss. LET YOUR CAREGIVER KNOW ABOUT:  Allergies to food or medicine.  Medicines taken, including vitamins, herbs, eyedrops, over-the-counter medicines, and creams.  Use of steroids (by mouth or creams).  Previous problems with anesthetics or numbing medicines, including problems  experienced by relatives.  History of bleeding problems or blood clots.  Previous surgeries and types of anesthetics received.  Possibility of pregnancy, if this applies.  Use of cigarettes, alcohol, or illegal drugs.  Any health condition(s), especially diabetes, sleep apnea, and high blood pressure. RISKS AND COMPLICATIONS General anesthesia rarely causes complications. However, if complications do occur, they can be life threatening. Complications include:  A lung infection.  A stroke.  A heart attack.  Waking up during the procedure. When this occurs, the patient may be unable to move and communicate that he or she is awake. The patient may feel severe pain. Older adults and adults with serious medical problems are more likely to have complications than adults who are young and healthy. Some complications can be prevented by answering all of your caregiver's questions thoroughly and by following all pre-procedure instructions. It is important to tell your caregiver if any of the pre-procedure instructions, especially those related to diet, were not followed. Any food or liquid in the stomach can cause problems when you are under general anesthesia. BEFORE THE PROCEDURE  Ask your caregiver if you will have to spend the night at the hospital. If you will not have to spend the night, arrange to have an adult drive you and stay with you for 24 hours.  Follow your caregiver's instructions if you are taking dietary supplements or medicines. Your caregiver may tell you to stop taking them or to reduce your dosage.  Do not smoke for as long as possible before your procedure.  If possible, stop smoking 3-6 weeks before the procedure.  Do not take new dietary supplements or medicines within 1 week of your procedure unless your caregiver approves them.  Do not eat within 8 hours of your procedure or as directed by your caregiver. Drink only clear liquids, such as water, black coffee (without  milk or cream), and fruit juices (without pulp).  Do not drink within 3 hours of your procedure or as directed by your caregiver.  You may brush your teeth on the morning of the procedure, but make sure to spit out the toothpaste and water when finished. PROCEDURE  You will receive anesthetics through a mask, through an intravenous (IV) access tube, or through both. A doctor who specializes in anesthesia (anesthesiologist) or a nurse who specializes in anesthesia (nurse anesthetist) or both will stay with you throughout the procedure to make sure you remain unconscious. He or she will also watch your blood pressure, pulse, and oxygen levels to make sure that the anesthetics do not cause any problems. Once you are asleep, a breathing tube or mask may be used to help you breathe. AFTER THE PROCEDURE You will wake up after the procedure is complete. You may be in the room where the procedure was performed or in a recovery area. You may have a sore throat if a breathing tube was used. You may also feel:  Dizzy.  Weak.  Drowsy.  Confused.  Nauseous.  Cold. These are all normal responses and can be expected to last for up to 24 hours after the procedure is complete. A caregiver will tell you when you are ready to go home. This will usually be when you are fully awake and in stable condition.   This information is not intended to replace advice given to you by your health care provider. Make sure you discuss any questions you have with your health care provider.   Document Released: 10/26/2007 Document Revised: 08/09/2014 Document Reviewed: 11/17/2011 Elsevier Interactive Patient Education 2016 [employer]. Esophagogastroduodenoscopy Esophagogastroduodenoscopy (EGD) is a procedure that is used to examine the lining of the esophagus, stomach, and first part of the small intestine (duodenum). A long, flexible, lighted tube with a camera attached (endoscope) is inserted down the throat to view  these organs. This procedure is done to detect problems or abnormalities, such as inflammation, bleeding, ulcers, or growths, in order to treat them. The procedure lasts 5-20 minutes. It is usually an outpatient procedure, but it may need to be performed in a hospital in emergency cases. LET [facility] CARE PROVIDER KNOW ABOUT:  Any allergies you have.  All medicines you are taking, including vitamins, herbs, eye drops, creams, and over-the-counter medicines.  Previous problems you or members of your family have had with the use of anesthetics.  Any blood disorders you have.  Previous surgeries you have had.  Medical conditions you have. RISKS AND COMPLICATIONS Generally, this is a safe procedure. However, problems can occur and include:  Infection.  Bleeding.  Tearing (perforation) of the esophagus, stomach, or duodenum.  Difficulty breathing or not being able to breathe.  Excessive sweating.  Spasms of the larynx.  Slowed heartbeat.  Low blood pressure. BEFORE THE PROCEDURE  Do not eat or drink anything after midnight on the night before the procedure or as directed by your health care provider.  Do not take your regular medicines before the procedure if your health care provider asks you not to. Ask your health care provider about changing or stopping  those medicines.  If you wear dentures, be prepared to remove them before the procedure.  Arrange for someone to drive you home after the procedure. PROCEDURE  A numbing medicine (local anesthetic) may be sprayed in your throat for comfort and to stop you from gagging or coughing.  You will have an IV tube inserted in a vein in your hand or arm. You will receive medicines and fluids through this tube.  You will be given a medicine to relax you (sedative).  A pain reliever will be given through the IV tube.  A mouth guard may be placed in your mouth to protect your teeth and to keep you from biting on the  endoscope.  You will be asked to lie on your left side.  The endoscope will be inserted down your throat and into your esophagus, stomach, and duodenum.  Air will be put through the endoscope to allow your health care provider to clearly view the lining of your esophagus.  The lining of your esophagus, stomach, and duodenum will be examined. During the exam, your health care provider may:  Remove tissue to be examined under a microscope (biopsy) for inflammation, infection, or other medical problems.  Remove growths.  Remove objects (foreign bodies) that are stuck.  Treat any bleeding with medicines or other devices that stop tissues from bleeding (hot cautery, clipping devices).  Widen (dilate) or stretch narrowed areas of your esophagus and stomach.  The endoscope will be withdrawn. AFTER THE PROCEDURE  You will be taken to a recovery area for observation. Your blood pressure, heart rate, breathing rate, and blood oxygen level will be monitored often until the medicines you were given have worn off.  Do not eat or drink anything until the numbing medicine has worn off and your gag reflex has returned. You may choke.  Your health care provider should be able to discuss his or her findings with you. It will take longer to discuss the test results if any biopsies were taken.   This information is not intended to replace advice given to you by your health care provider. Make sure you discuss any questions you have with your health care provider.   Document Released: 11/19/2004 Document Revised: 08/09/2014 Document Reviewed: 06/21/2012 Elsevier Interactive Patient Education [employer].

## 2015-10-06 ENCOUNTER — Encounter (HOSPITAL_COMMUNITY): Payer: Self-pay

## 2015-10-06 ENCOUNTER — Telehealth: Payer: Self-pay | Admitting: Orthopedic Surgery

## 2015-10-06 ENCOUNTER — Encounter (HOSPITAL_COMMUNITY)
Admission: RE | Admit: 2015-10-06 | Discharge: 2015-10-06 | Disposition: A | Discharge status: Home / Self Care | Payer: Commercial Managed Care - HMO | Source: Ambulatory Visit | Attending: Internal Medicine | Admitting: Internal Medicine

## 2015-10-06 ENCOUNTER — Other Ambulatory Visit: Payer: Self-pay | Admitting: Orthopedic Surgery

## 2015-10-06 ENCOUNTER — Other Ambulatory Visit: Payer: Self-pay | Admitting: *Deleted

## 2015-10-06 DIAGNOSIS — Z01812 Encounter for preprocedural laboratory examination: Secondary | ICD-10-CM | POA: Insufficient documentation

## 2015-10-06 LAB — BASIC METABOLIC PANEL
Anion gap: 6 (ref 5–15)
BUN: 6 mg/dL (ref 6–20)
CO2: 18 mmol/L — ABNORMAL LOW (ref 22–32)
Calcium: 9.6 mg/dL (ref 8.9–10.3)
Chloride: 110 mmol/L (ref 101–111)
Creatinine, Ser: 0.78 mg/dL (ref 0.44–1.00)
GFR calc Af Amer: >60 mL/min (ref >60)
GFR calc non Af Amer: >60 mL/min (ref >60)
Glucose, Bld: 103 mg/dL — ABNORMAL HIGH (ref 65–99)
Potassium: 4.3 mmol/L (ref 3.5–5.1)
Sodium: 134 mmol/L — ABNORMAL LOW (ref 135–145)

## 2015-10-06 LAB — CBC
HCT: 37.3 % (ref 36.0–46.0)
Hemoglobin: 12.3 g/dL (ref 12.0–15.0)
MCH: 30.1 pg (ref 26.0–34.0)
MCHC: 33 g/dL (ref 30.0–36.0)
MCV: 91.4 fL (ref 78.0–100.0)
Platelets: 200 10*3/uL (ref 150–400)
RBC: 4.08 MIL/uL (ref 3.87–5.11)
RDW: 14.7 % (ref 11.5–15.5)
WBC: 6.5 10*3/uL (ref 4.0–10.5)

## 2015-10-06 MED ORDER — HYDROCODONE-ACETAMINOPHEN 7.5-325 MG PO TABS: 1.0000 | ORAL_TABLET | Freq: Four times a day (QID) | ORAL | Status: DC | PRN

## 2015-10-06 MED ORDER — HYDROCODONE-ACETAMINOPHEN 7.5-325 MG PO TABS: 1.0000 | ORAL_TABLET | Freq: Three times a day (TID) | ORAL | Status: DC | PRN

## 2015-10-06 NOTE — Telephone Encounter (Signed)
Patient requesting refill on  Hydrocodone 7.5/325mg   Qty 112 Tablets

## 2015-10-06 NOTE — Telephone Encounter (Signed)
Prescription available, called patient, no answer 

## 2015-10-09 ENCOUNTER — Ambulatory Visit (HOSPITAL_COMMUNITY)
Admission: RE | Admit: 2015-10-09 | Payer: Commercial Managed Care - HMO | Source: Ambulatory Visit | Admitting: Internal Medicine

## 2015-10-09 ENCOUNTER — Encounter (HOSPITAL_COMMUNITY): Admission: RE | Payer: Self-pay | Source: Ambulatory Visit

## 2015-10-09 SURGERY — ESOPHAGOGASTRODUODENOSCOPY (EGD) WITH PROPOFOL
Anesthesia: Monitor Anesthesia Care

## 2015-10-11 ENCOUNTER — Other Ambulatory Visit: Payer: Self-pay

## 2015-10-11 ENCOUNTER — Emergency Department (HOSPITAL_COMMUNITY)
Admission: EM | Admit: 2015-10-11 | Discharge: 2015-10-11 | Disposition: A | Discharge status: Home / Self Care | Payer: Commercial Managed Care - HMO | Attending: Emergency Medicine | Admitting: Emergency Medicine

## 2015-10-11 ENCOUNTER — Emergency Department (HOSPITAL_COMMUNITY): Payer: Commercial Managed Care - HMO

## 2015-10-11 ENCOUNTER — Encounter (HOSPITAL_COMMUNITY): Payer: Self-pay | Admitting: Emergency Medicine

## 2015-10-11 DIAGNOSIS — Z9049 Acquired absence of other specified parts of digestive tract: Secondary | ICD-10-CM | POA: Diagnosis not present

## 2015-10-11 DIAGNOSIS — Z7982 Long term (current) use of aspirin: Secondary | ICD-10-CM | POA: Diagnosis not present

## 2015-10-11 DIAGNOSIS — Z79899 Other long term (current) drug therapy: Secondary | ICD-10-CM | POA: Insufficient documentation

## 2015-10-11 DIAGNOSIS — F17210 Nicotine dependence, cigarettes, uncomplicated: Secondary | ICD-10-CM | POA: Insufficient documentation

## 2015-10-11 DIAGNOSIS — I10 Essential (primary) hypertension: Secondary | ICD-10-CM | POA: Diagnosis not present

## 2015-10-11 DIAGNOSIS — J45901 Unspecified asthma with (acute) exacerbation: Secondary | ICD-10-CM

## 2015-10-11 DIAGNOSIS — K2970 Gastritis, unspecified, without bleeding: Secondary | ICD-10-CM | POA: Insufficient documentation

## 2015-10-11 DIAGNOSIS — R0602 Shortness of breath: Secondary | ICD-10-CM | POA: Diagnosis not present

## 2015-10-11 LAB — CBC WITH DIFFERENTIAL/PLATELET
Basophils Absolute: 0 10*3/uL (ref 0.0–0.1)
Basophils Relative: 0 %
Eosinophils Absolute: 0.1 10*3/uL (ref 0.0–0.7)
Eosinophils Relative: 1 %
HCT: 42 % (ref 36.0–46.0)
Hemoglobin: 14.7 g/dL (ref 12.0–15.0)
Lymphocytes Relative: 19 %
Lymphs Abs: 1.1 10*3/uL (ref 0.7–4.0)
MCH: 30.7 pg (ref 26.0–34.0)
MCHC: 35 g/dL (ref 30.0–36.0)
MCV: 87.7 fL (ref 78.0–100.0)
Monocytes Absolute: 0.5 10*3/uL (ref 0.1–1.0)
Monocytes Relative: 9 %
Neutro Abs: 4.2 10*3/uL (ref 1.7–7.7)
Neutrophils Relative %: 71 %
Platelets: 160 10*3/uL (ref 150–400)
RBC: 4.79 MIL/uL (ref 3.87–5.11)
RDW: 14.1 % (ref 11.5–15.5)
WBC: 5.9 10*3/uL (ref 4.0–10.5)

## 2015-10-11 LAB — COMPREHENSIVE METABOLIC PANEL
ALT: 18 U/L (ref 14–54)
AST: 32 U/L (ref 15–41)
Albumin: 4.4 g/dL (ref 3.5–5.0)
Alkaline Phosphatase: 58 U/L (ref 38–126)
Anion gap: 9 (ref 5–15)
BUN: 12 mg/dL (ref 6–20)
CO2: 22 mmol/L (ref 22–32)
Calcium: 9.6 mg/dL (ref 8.9–10.3)
Chloride: 104 mmol/L (ref 101–111)
Creatinine, Ser: 0.88 mg/dL (ref 0.44–1.00)
GFR calc Af Amer: >60 mL/min (ref >60)
GFR calc non Af Amer: >60 mL/min (ref >60)
Glucose, Bld: 108 mg/dL — ABNORMAL HIGH (ref 65–99)
Potassium: 4 mmol/L (ref 3.5–5.1)
Sodium: 135 mmol/L (ref 135–145)
Total Bilirubin: 1 mg/dL (ref 0.3–1.2)
Total Protein: 8.8 g/dL — ABNORMAL HIGH (ref 6.5–8.1)

## 2015-10-11 LAB — BRAIN NATRIURETIC PEPTIDE: B Natriuretic Peptide: 11 pg/mL (ref 0.0–100.0)

## 2015-10-11 LAB — TROPONIN I: Troponin I: <0.03 ng/mL (ref <0.031)

## 2015-10-11 LAB — LIPASE, BLOOD: Lipase: 24 U/L (ref 11–51)

## 2015-10-11 MED ORDER — GI COCKTAIL ~~LOC~~
30.0000 mL | Freq: Once | ORAL | Status: AC
  Administered 2015-10-11: 30 mL via ORAL
  Filled 2015-10-11: qty 30

## 2015-10-11 MED ORDER — ALBUTEROL SULFATE HFA 108 (90 BASE) MCG/ACT IN AERS
2.0000 | INHALATION_SPRAY | RESPIRATORY_TRACT | Status: DC | PRN
  Administered 2015-10-11: 2 via RESPIRATORY_TRACT
  Filled 2015-10-11: qty 6.7

## 2015-10-11 MED ORDER — PREDNISONE 20 MG PO TABS: ORAL_TABLET | ORAL | Status: DC

## 2015-10-11 MED ORDER — PANTOPRAZOLE SODIUM 40 MG IV SOLR
40.0000 mg | Freq: Once | INTRAVENOUS | Status: AC
  Administered 2015-10-11: 40 mg via INTRAVENOUS
  Filled 2015-10-11: qty 40

## 2015-10-11 MED ORDER — AZITHROMYCIN 250 MG PO TABS: 250.0000 mg | ORAL_TABLET | Freq: Every day | ORAL | Status: DC

## 2015-10-11 MED ORDER — METHYLPREDNISOLONE SODIUM SUCC 125 MG IJ SOLR
125.0000 mg | Freq: Once | INTRAMUSCULAR | Status: AC
  Administered 2015-10-11: 125 mg via INTRAVENOUS
  Filled 2015-10-11: qty 2

## 2015-10-11 MED ORDER — BENZONATATE 100 MG PO CAPS: 100.0000 mg | ORAL_CAPSULE | Freq: Three times a day (TID) | ORAL | Status: DC

## 2015-10-11 MED ORDER — ALBUTEROL (5 MG/ML) CONTINUOUS INHALATION SOLN
10.0000 mg/h | INHALATION_SOLUTION | RESPIRATORY_TRACT | Status: DC
  Administered 2015-10-11: 10 mg/h via RESPIRATORY_TRACT
  Filled 2015-10-11: qty 20

## 2015-10-11 MED ORDER — ALBUTEROL SULFATE (2.5 MG/3ML) 0.083% IN NEBU
5.0000 mg | INHALATION_SOLUTION | Freq: Once | RESPIRATORY_TRACT | Status: AC
  Administered 2015-10-11: 5 mg via RESPIRATORY_TRACT
  Filled 2015-10-11: qty 6

## 2015-10-11 NOTE — Discharge Instructions (Signed)
Stop taking ibuprofen and other NSAIDs. You may use over-the-counter Mylanta to or Maalox for the abdominal pain. Usually her inhaler as needed for wheezing. Take antibiotics and steroids as prescribed. Follow-up with your primary physician and gastroenterologist. Return immediately for worsening symptoms or any concerns.  Asthma, Acute Bronchospasm Acute bronchospasm caused by asthma is also referred to as an asthma attack. Bronchospasm means your air passages become narrowed. The narrowing is caused by inflammation and tightening of the muscles in the air tubes (bronchi) in your lungs. This can make it hard to breathe or cause you to wheeze and cough. CAUSES Possible triggers are:  Animal dander from the skin, hair, or feathers of animals.  Dust mites contained in house dust.  Cockroaches.  Pollen from trees or grass.  Mold.  Cigarette or tobacco smoke.  Air pollutants such as dust, household cleaners, hair sprays, aerosol sprays, paint fumes, strong chemicals, or strong odors.  Cold air or weather changes. Cold air may trigger inflammation. Winds increase molds and pollens in the air.  Strong emotions such as crying or laughing hard.  Stress.  Certain medicines such as aspirin or beta-blockers.  Sulfites in foods and drinks, such as dried fruits and wine.  Infections or inflammatory conditions, such as a flu, cold, or inflammation of the nasal membranes (rhinitis).  Gastroesophageal reflux disease (GERD). GERD is a condition where stomach acid backs up into your esophagus.  Exercise or strenuous activity. SIGNS AND SYMPTOMS   Wheezing.  Excessive coughing, particularly at night.  Chest tightness.  Shortness of breath. DIAGNOSIS  Your health care provider will ask you about your medical history and perform a physical exam. A chest X-ray or blood testing may be performed to look for other causes of your symptoms or other conditions that may have triggered your asthma  attack. TREATMENT  Treatment is aimed at reducing inflammation and opening up the airways in your lungs. Most asthma attacks are treated with inhaled medicines. These include quick relief or rescue medicines (such as bronchodilators) and controller medicines (such as inhaled corticosteroids). These medicines are sometimes given through an inhaler or a nebulizer. Systemic steroid medicine taken by mouth or given through an IV tube also can be used to reduce the inflammation when an attack is moderate or severe. Antibiotic medicines are only used if a bacterial infection is present.  HOME CARE INSTRUCTIONS   Rest.  Drink plenty of liquids. This helps the mucus to remain thin and be easily coughed up. Only use caffeine in moderation and do not use alcohol until you have recovered from your illness.  Do not smoke. Avoid being exposed to secondhand smoke.  You play a critical role in keeping yourself in good health. Avoid exposure to things that cause you to wheeze or to have breathing problems.  Keep your medicines up-to-date and available. Carefully follow your health care provider's treatment plan.  Take your medicine exactly as prescribed.  When pollen or pollution is bad, keep windows closed and use an air conditioner or go to places with air conditioning.  Asthma requires careful medical care. See your health care provider for a follow-up as advised. If you are more than [redacted] weeks pregnant and you were prescribed any new medicines, let your obstetrician know about the visit and how you are doing. Follow up with your health care provider as directed.  After you have recovered from your asthma attack, make an appointment with your outpatient doctor to talk about ways to reduce the likelihood  of future attacks. If you do not have a doctor who manages your asthma, make an appointment with a primary care doctor to discuss your asthma. SEEK IMMEDIATE MEDICAL CARE IF:   You are getting  worse.  You have trouble breathing. If severe, call your local emergency services (911 in the [place]  You develop chest pain or discomfort.  You are vomiting.  You are not able to keep fluids down.  You are coughing up yellow, green, brown, or bloody sputum.  You have a fever and your symptoms suddenly get worse.  You have trouble swallowing. MAKE SURE YOU:   Understand these instructions.  Will watch your condition.  Will get help right away if you are not doing well or get worse.   This information is not intended to replace advice given to you by your health care provider. Make sure you discuss any questions you have with your health care provider.   Document Released: 11/03/2006 Document Revised: 07/24/2013 Document Reviewed: 01/24/2013 Elsevier Interactive Patient Education 2016 [employer]   Gastritis, Adult Gastritis is soreness and swelling (inflammation) of the lining of the stomach. Gastritis can develop as a sudden onset (acute) or long-term (chronic) condition. If gastritis is not treated, it can lead to stomach bleeding and ulcers. CAUSES  Gastritis occurs when the stomach lining is weak or damaged. Digestive juices from the stomach then inflame the weakened stomach lining. The stomach lining may be weak or damaged due to viral or bacterial infections. One common bacterial infection is the Helicobacter pylori infection. Gastritis can also result from excessive alcohol consumption, taking certain medicines, or having too much acid in the stomach.  SYMPTOMS  In some cases, there are no symptoms. When symptoms are present, they may include:  Pain or a burning sensation in the upper abdomen.  Nausea.  Vomiting.  An uncomfortable feeling of fullness after eating. DIAGNOSIS  Your caregiver may suspect you have gastritis based on your symptoms and a physical exam. To determine the cause of your gastritis, your caregiver may perform the following:  Blood or stool  tests to check for the H pylori bacterium.  Gastroscopy. A thin, flexible tube (endoscope) is passed down the esophagus and into the stomach. The endoscope has a light and camera on the end. Your caregiver uses the endoscope to view the inside of the stomach.  Taking a tissue sample (biopsy) from the stomach to examine under a microscope. TREATMENT  Depending on the cause of your gastritis, medicines may be prescribed. If you have a bacterial infection, such as an H pylori infection, antibiotics may be given. If your gastritis is caused by too much acid in the stomach, H2 blockers or antacids may be given. Your caregiver may recommend that you stop taking aspirin, ibuprofen, or other nonsteroidal anti-inflammatory drugs (NSAIDs). HOME CARE INSTRUCTIONS  Only take over-the-counter or prescription medicines as directed by your caregiver.  If you were given antibiotic medicines, take them as directed. Finish them even if you start to feel better.  Drink enough fluids to keep your urine clear or pale yellow.  Avoid foods and drinks that make your symptoms worse, such as:  Caffeine or alcoholic drinks.  Chocolate.  Peppermint or mint flavorings.  Garlic and onions.  Spicy foods.  Citrus fruits, such as oranges, lemons, or limes.  Tomato-based foods such as sauce, chili, salsa, and pizza.  Fried and fatty foods.  Eat small, frequent meals instead of large meals. SEEK IMMEDIATE MEDICAL CARE IF:  You have black or dark red stools.  You vomit blood or material that looks like coffee grounds.  You are unable to keep fluids down.  Your abdominal pain gets worse.  You have a fever.  You do not feel better after 1 week.  You have any other questions or concerns. MAKE SURE YOU:  Understand these instructions.  Will watch your condition.  Will get help right away if you are not doing well or get worse.   This information is not intended to replace advice given to you by  your health care provider. Make sure you discuss any questions you have with your health care provider.   Document Released: 07/13/2001 Document Revised: 01/18/2012 Document Reviewed: 09/01/2011 Elsevier Interactive Patient Education [employer].

## 2015-10-11 NOTE — ED Notes (Signed)
Patient complaining of cough and upper left abdominal pain x 2-3 days. States "I feel like the cold has settled in my upper stomach and it hurts when I cough." Also complaining of shortness of breath x 2 weeks with exertion.

## 2015-10-11 NOTE — ED Provider Notes (Signed)
CSN: [id number]     Arrival date & time 10/11/15  1105 History  By signing my name below, I, [name], attest that this documentation has been prepared under the direction and in the presence of [name], MD. Electronically Signed: [name], ED Scribe. 10/11/2015. 11:33 AM.   Chief Complaint  Patient presents with  . Cough  . Shortness of Breath   Patient is a 60 y.o. female presenting with cough and shortness of breath. The history is provided by the patient. No language interpreter was used.  Cough Cough characteristics:  Productive Severity:  Severe Onset quality:  Gradual Duration:  3 days Timing:  Constant Progression:  Worsening Chronicity:  New Smoker: yes   Relieved by:  None tried Worsened by:  Nothing tried Ineffective treatments: leftover antibiotics. Associated symptoms: chills, fever (subjective), shortness of breath and wheezing   Associated symptoms: no chest pain, no headaches, no rash and no sore throat   Shortness of Breath Associated symptoms: abdominal pain, cough, fever (subjective) and wheezing   Associated symptoms: no chest pain, no headaches, no neck pain, no rash, no sore throat and no vomiting   HPI Comments: Colleen Lee is a 60 y.o. female with a history of asthma, [name], GERD, hypertension, and cirrhosis, who presents to the Emergency Department complaining of a constant, severe, productive cough that began 3 days ago and LUQ pain that began at the same time. She notes an associated, resolved episode of subjective fever and chills that began when her symptoms started. Patient states she had taken an antibiotic left over from an old prescription, with no relief to her symptoms. She reports her SOB is exacerbated by exertion. Patient notes a history of left knee arthroplasty, for which she regularly takes ibuprofen. Patient states she has not had an asthma exacerbation in a long amount of time, so she has not had to use her inhaler.   Past  Medical History  Diagnosis Date  . Hypertension   . Fibroids   . [name]     HCV RNA positive 09/2012  . GERD (gastroesophageal reflux disease)   . H. pylori infection 2014    treated with pylera, had to be treated again as it was not eradicated. Urea breath test then negative after subsequent treatment.   . Asthma   . PONV (postoperative nausea and vomiting)     pt thinks maybe once she had N&V  . Cirrhosis ([place])     Metavir score F4 on elastography 2015  . Fibromyalgia   . Chronic back pain   . Sciatica of left side    Past Surgical History  Procedure Laterality Date  . Knee surgery      left knee  . Colonoscopy  01/18/2008    YIF:OYDXAJ rectum.  Long redundant colon, a diminutive sigmoid polyp status post cold biopsy removed. Hyperplastic polyp. Repeat colonoscopy June 2014 due to family history of colon cancer  . Esophagogastroduodenoscopy  01/18/2008    RMR: Normal esophagus, normal  stomach  . Colonoscopy with esophagogastroduodenoscopy (egd) N/A 11/02/2012    OIN:OMVEHMCN gastric mucosa of doubtful, +H.pylori. Incomplete colonoscopy due to patient unable to tolerate exam, proximal colon seen. Patient refused ACBE.  . Colonoscopy with propofol N/A 03/08/2013    Dr. Gala Romney: colonic polyp-removed as scribed above. Internal Hemorrhoids. Pathology did not reveal any colonic tissue, only mucus. SURVEILLANCE DUE Aug 2019  . Polypectomy N/A 03/08/2013    Procedure: POLYPECTOMY;  Surgeon: Daneil Dolin, MD;  Location:  [facility];  Service: Endoscopy;  Laterality: N/A;  . Multiple extractions with alveoloplasty N/A 10/15/2013    Procedure: MULTIPLE EXTRACTION WITH ALVEOLOPLASTY;  Surgeon: Gae Bon, DDS;  Location: [facility];  Service: Oral Surgery;  Laterality: N/A;  . Total knee arthroplasty Left 02/05/2015    Procedure: LEFT TOTAL KNEE ARTHROPLASTY;  Surgeon: Carole Civil, MD;  Location: [facility];  Service: Orthopedics;  Laterality: Left;   Family History  Problem Relation Age of  Onset  . Colon cancer Brother        . Asthma      FH  . Multiple myeloma Brother   . Liver cancer Sister   . Prostate cancer Brother   . Pancreatic cancer Brother   . Cancer Other     niece, age 67, primary unknown   Social History  Substance Use Topics  . Smoking status: Current Every Day Smoker -- 1.00 packs/day for 40 years    Types: Cigarettes  . Smokeless tobacco: None     Comment: Smokes one pack of cigarettes daily  . Alcohol Use: No     Comment: former, last Jan 2014   OB History    Gravida Para Term Preterm AB TAB SAB Ectopic Multiple Living            1     Review of Systems  Constitutional: Positive for fever (subjective) and chills.  HENT: Negative for congestion and sore throat.   Respiratory: Positive for cough, shortness of breath and wheezing.   Cardiovascular: Negative for chest pain.  Gastrointestinal: Positive for abdominal pain. Negative for nausea, vomiting, diarrhea, constipation and blood in stool.  Genitourinary: Negative for dysuria, frequency and flank pain.  Musculoskeletal: Negative for back pain, neck pain and neck stiffness.  Skin: Negative for rash and wound.  Neurological: Negative for dizziness, weakness, light-headedness, numbness and headaches.  All other systems reviewed and are negative.  Allergies  Penicillins  Home Medications   Prior to Admission medications   Medication Sig Start Date End Date Taking? Authorizing Provider  amLODipine (NORVASC) 10 MG tablet Take 1 tablet by mouth daily. Take along with 5 mg for a total of 15 mg. 08/07/15  Yes Historical Provider, MD  amLODipine (NORVASC) 5 MG tablet Take 5 mg by mouth daily. Take along with 10 mg for a total of 15 mg.   Yes Historical Provider, MD  aspirin EC 325 MG EC tablet Take 1 tablet (325 mg total) by mouth 2 (two) times daily. 02/08/15  Yes Carole Civil, MD  Cyanocobalamin (VITAMIN B 12 PO) Take 1 tablet by mouth daily.   Yes Historical Provider, MD  cyclobenzaprine  (FLEXERIL) 10 MG tablet Take 1 tablet (10 mg total) by mouth 3 (three) times daily. 07/29/15  Yes Carole Civil, MD  esomeprazole (NEXIUM) 40 MG capsule Take 1 capsule (40 mg total) by mouth every morning. 12/19/14  Yes Orvil Feil, NP  HYDROcodone-acetaminophen (NORCO) 7.5-325 MG tablet Take 1 tablet by mouth every 8 (eight) hours as needed for moderate pain. 10/06/15  Yes Carole Civil, MD  ibuprofen (ADVIL,MOTRIN) 800 MG tablet Take 1 tablet (800 mg total) by mouth every 8 (eight) hours as needed. Patient taking differently: Take 800 mg by mouth every 8 (eight) hours as needed for moderate pain.  07/29/15  Yes Carole Civil, MD  lisinopril (PRINIVIL,ZESTRIL) 20 MG tablet Take 1 tablet by mouth daily. 09/25/15  Yes Historical Provider, MD  loperamide (IMODIUM A-D) 2 MG tablet  Take 1 tablet (2 mg total) by mouth 4 (four) times daily as needed for diarrhea or loose stools. 07/02/15  Yes Fredia Sorrow, MD  megestrol (MEGACE) 40 MG tablet Take 40 mg by mouth daily.   Yes Historical Provider, MD  ondansetron (ZOFRAN ODT) 4 MG disintegrating tablet Take 1 tablet (4 mg total) by mouth every 8 (eight) hours as needed. Patient taking differently: Take 4 mg by mouth every 8 (eight) hours as needed for nausea or vomiting.  07/02/15  Yes Fredia Sorrow, MD  tiZANidine (ZANAFLEX) 2 MG tablet Take 1 tablet (2 mg total) by mouth every 8 (eight) hours as needed. Patient taking differently: Take 2 mg by mouth every 8 (eight) hours as needed for muscle spasms.  02/08/15  Yes Carole Civil, MD  azithromycin (ZITHROMAX) 250 MG tablet Take 1 tablet (250 mg total) by mouth daily. Take first 2 tablets together, then 1 every day until finished. 10/11/15   [name], MD  benzonatate (TESSALON) 100 MG capsule Take 1 capsule (100 mg total) by mouth every 8 (eight) hours. 10/11/15   [name], MD  predniSONE (DELTASONE) 20 MG tablet 3 tabs po day one, then 2 po daily x 4 days 10/11/15   [name], MD   BP 121/68 mmHg  Pulse 90  Temp(Src) 98.4 F (36.9 C) (Oral)  Resp 16  Ht _0  (1.676 m)  Wt 189 lb (85.73 kg)  BMI 30.52 kg/m2  SpO2 100% Physical Exam  Constitutional: She is oriented to person, place, and time. She appears well-developed and well-nourished. No distress.  HENT:  Head: Normocephalic and atraumatic.  Mouth/Throat: Oropharynx is clear and moist. No oropharyngeal exudate.  Eyes: Conjunctivae and EOM are normal. Pupils are equal, round, and reactive to light.  Neck: Normal range of motion. Neck supple. No tracheal deviation present.  Cardiovascular: Normal rate and regular rhythm.   Pulmonary/Chest: Effort normal. No respiratory distress. She has wheezes (diffuse expiratory wheezes). She has no rales.  Abdominal: Soft. Bowel sounds are normal. She exhibits no distension and no mass. There is tenderness (left upper quadrant tenderness with palpation.). There is no rebound and no guarding.  Musculoskeletal: Normal range of motion. She exhibits no edema or tenderness.  No lower extremity asymmetry or calf tenderness.  Neurological: She is alert and oriented to person, place, and time.  Moves all extremities without deficit. Sensation is grossly intact.  Skin: Skin is warm and dry. No rash noted. No erythema.  Psychiatric: She has a normal mood and affect. Her behavior is normal.  Nursing note and vitals reviewed.   ED Course  Procedures (including critical care time)  DIAGNOSTIC STUDIES: Oxygen Saturation is 100% on RA, normal by my interpretation.    COORDINATION OF CARE: 11:33 AM - Discussed treatment plan with pt at bedside which includes nebulizer treatment and diagnostic testing. Pt verbalized understanding and agreed to plan.   Labs Review Labs Reviewed  COMPREHENSIVE METABOLIC PANEL - Abnormal; Notable for the following:    Glucose, Bld 108 (*)    Total Protein 8.8 (*)    All other components within normal limits  CBC WITH  DIFFERENTIAL/PLATELET  BRAIN NATRIURETIC PEPTIDE  LIPASE, BLOOD  TROPONIN I  URINALYSIS, ROUTINE W REFLEX MICROSCOPIC (NOT AT [facility])    Imaging Review Dg Chest 2 View  10/11/2015  CLINICAL DATA:  Shortness of breath EXAM: CHEST  2 VIEW COMPARISON:  04/01/2015 FINDINGS: Lungs are clear.  No pleural effusion or pneumothorax. The heart is normal  in size. Visualized osseous structures are within normal limits. IMPRESSION: No evidence of acute cardiopulmonary disease. Electronically Signed   By: Julian Hy M.D.   On: 10/11/2015 12:53   I have personally reviewed and evaluated these images and lab results as part of my medical decision-making.   EKG Interpretation   Date/Time:  Saturday October 11 2015 11:26:05 EST Ventricular Rate:  95 PR Interval:  129 QRS Duration: 85 QT Interval:  345 QTC Calculation: 434 R Axis:   73 Text Interpretation:  Sinus rhythm Consider left ventricular hypertrophy  Confirmed by Lita Mains  MD, Coralyn Roselli ([id number]) on 10/11/2015 1:59:20 PM      MDM   Final diagnoses:  Asthma exacerbation  Gastritis    I personally performed the services described in this documentation, which was scribed in my presence. The recorded information has been reviewed and is accurate.   Improved left upper quadrant tenderness after GI cocktail. Wheezing has improved though still present. We'll give hour-long continuous. Patient continues to be very well-appearing.  Wheezing has improved. Patient states she is feeling much better. Given history of asthma Will start on antibiotic and give short course of steroids. Patient is advised to stop taking all NSAIDs. She is to continue on her Nexium and may use over-the-counter Maalox or Mylanta. Return precautions have been given.  [name], MD 10/11/15 1455

## 2015-10-22 ENCOUNTER — Encounter: Payer: Self-pay | Admitting: Nurse Practitioner

## 2015-10-22 ENCOUNTER — Ambulatory Visit (INDEPENDENT_AMBULATORY_CARE_PROVIDER_SITE_OTHER): Payer: Commercial Managed Care - HMO | Admitting: Nurse Practitioner

## 2015-10-22 VITALS — BP 135/78 | HR 86 | Temp 98.0°F | Ht 66.0 in | Wt 182.6 lb

## 2015-10-22 DIAGNOSIS — K7460 Unspecified cirrhosis of liver: Secondary | ICD-10-CM

## 2015-10-22 NOTE — Patient Instructions (Signed)
1. See your primary care as soon as possible. 2. Return for follow-up in 3 months to consider endoscopy scheduling at that time. 3. If your breathing becomes worse or have dizziness, lightheadedness at rest proceed to the emergency room for further evaluation.

## 2015-10-22 NOTE — Progress Notes (Signed)
Referring Provider: Rosita Fire, MD Primary Care Physician:  Rosita Fire, MD Primary GI:  Dr. Gala Romney   Chief Complaint  Patient presents with  . EGD    HPI:   Colleen Lee is a 60 y.o. female who presents to schedule upper endoscopy. 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 She was doing well at that time and previous endoscopy dated 2014 due for repeat EGD for variceal surveillance. This is initially scheduled for 10/09/2015, but was canceled. Recently seen in the emergency room 10/11/2015 for asthma exacerbation.  Today she states she had to postpone her EGD scheduled due to getting sick ("A cold"). Breathing continues to be an issue. Gets short of breath walking from the car to the front of the office or the store. Has a history of asthma but has not had symptoms in a while. Still has inhalers and other medications from the ER. Symptoms include shortness of breath, cough, occasionally dizziness after prolonged coughing. Denies fever, chills. Occasional abdominal pain associated with occasional constipation, otherwise no abdominal pain. Denies hematochezia, melena, yellowing of skin or eyes, darkened urine, acute episodic confusion. Denies chest pain, dyspnea, dizziness, lightheadedness, syncope, near syncope. Denies any other upper or lower GI symptoms.  Past Medical History  Diagnosis Date  . Hypertension   . Fibroids   . [name]     HCV RNA positive 09/2012  . GERD (gastroesophageal reflux disease)   . H. pylori infection 2014    treated with pylera, had to be treated again as it was not eradicated. Urea breath test then negative after subsequent treatment.   . Asthma   . PONV (postoperative nausea and vomiting)     pt thinks maybe once she had N&V  . Cirrhosis ([place])     Metavir score F4 on elastography 2015  . Fibromyalgia   . Chronic back pain   . Sciatica of left side       Past Surgical History  Procedure Laterality Date  . Knee surgery      left knee  . Colonoscopy  01/18/2008    UYQ:IHKVQQ rectum.  Long redundant colon, a diminutive sigmoid polyp status post cold biopsy removed. Hyperplastic polyp. Repeat colonoscopy June 2014 due to family history of colon cancer  . Esophagogastroduodenoscopy  01/18/2008    RMR: Normal esophagus, normal  stomach  . Colonoscopy with esophagogastroduodenoscopy (egd) N/A 11/02/2012    VZD:GLOVFIEP gastric mucosa of doubtful, +H.pylori. Incomplete colonoscopy due to patient unable to tolerate exam, proximal colon seen. Patient refused ACBE.  . Colonoscopy with propofol N/A 03/08/2013    Dr. Gala Romney: colonic polyp-removed as scribed above. Internal Hemorrhoids. Pathology did not reveal any colonic tissue, only mucus. SURVEILLANCE DUE Aug 2019  . Polypectomy N/A 03/08/2013    Procedure: POLYPECTOMY;  Surgeon: Daneil Dolin, MD;  Location: [facility];  Service: Endoscopy;  Laterality: N/A;  . Multiple extractions with alveoloplasty N/A 10/15/2013    Procedure: MULTIPLE EXTRACTION WITH ALVEOLOPLASTY;  Surgeon: Gae Bon, DDS;  Location: [facility];  Service: Oral Surgery;  Laterality: N/A;  . Total knee arthroplasty Left 02/05/2015    Procedure: LEFT TOTAL KNEE ARTHROPLASTY;  Surgeon: Carole Civil, MD;  Location: [facility];  Service: Orthopedics;  Laterality: Left;    Current Outpatient Prescriptions  Medication Sig Dispense Refill  . amLODipine (NORVASC) 10 MG tablet Take 1 tablet by mouth daily. Take along with 5 mg  for a total of 15 mg.    . aspirin EC 325 MG EC tablet Take 1 tablet (325 mg total) by mouth 2 (two) times daily. 60 tablet 0  . benzonatate (TESSALON) 100 MG capsule Take 1 capsule (100 mg total) by mouth every 8 (eight) hours. 21 capsule 0  . Cyanocobalamin (VITAMIN B 12 PO) Take 1 tablet by mouth daily.    . cyclobenzaprine (FLEXERIL) 10 MG tablet Take 1 tablet (10 mg total) by mouth 3 (three) times daily. 90 tablet 5   . esomeprazole (NEXIUM) 40 MG capsule Take 1 capsule (40 mg total) by mouth every morning. 10 capsule 0  . HYDROcodone-acetaminophen (NORCO) 7.5-325 MG tablet Take 1 tablet by mouth every 8 (eight) hours as needed for moderate pain. 90 tablet 0  . lisinopril (PRINIVIL,ZESTRIL) 20 MG tablet Take 1 tablet by mouth daily.    Marland Kitchen loperamide (IMODIUM A-D) 2 MG tablet Take 1 tablet (2 mg total) by mouth 4 (four) times daily as needed for diarrhea or loose stools. 30 tablet 0  . megestrol (MEGACE) 40 MG tablet Take 40 mg by mouth daily.    . ondansetron (ZOFRAN ODT) 4 MG disintegrating tablet Take 1 tablet (4 mg total) by mouth every 8 (eight) hours as needed. (Patient taking differently: Take 4 mg by mouth every 8 (eight) hours as needed for nausea or vomiting. ) 10 tablet 1  . tiZANidine (ZANAFLEX) 2 MG tablet Take 1 tablet (2 mg total) by mouth every 8 (eight) hours as needed. (Patient taking differently: Take 2 mg by mouth every 8 (eight) hours as needed for muscle spasms. ) 60 tablet 0   No current facility-administered medications for this visit.    Allergies as of 10/22/2015 - Review Complete 10/22/2015  Allergen Reaction Noted  . Penicillins Rash     Family History  Problem Relation Age of Onset  . Colon cancer Brother        . Asthma      FH  . Multiple myeloma Brother   . Liver cancer Sister   . Prostate cancer Brother   . Pancreatic cancer Brother   . Cancer Other     niece, age 30, primary unknown    Social History   Social History  . Marital Status: Married    Spouse Name: N/A  . Number of Children: 0  . Years of Education: N/A   Occupational History  . umemployed     Social History Main Topics  . Smoking status: Current Every Day Smoker -- 1.00 packs/day for 40 years    Types: Cigarettes  . Smokeless tobacco: Never Used     Comment: Smokes one pack of cigarettes daily  . Alcohol Use: No     Comment: former, last Jan 2014  . Drug Use: No     Comment: history of  marijuana in past  . Sexual Activity: No   Other Topics Concern  . None   Social History Narrative    Review of Systems: General: Negative for anorexia, weight loss, fever, chills, fatigue, weakness. Eyes: Negative for vision changes.  ENT: Negative for hoarseness, difficulty swallowing. CV: Negative for chest pain, angina, palpitations, peripheral edema.  Respiratory: Admits post-tussive dyspnea at rest, dyspnea on exertion, increased cough and wheezing. Gets short of breath with excessive coughing. GI: See history of present illness. Endo: Negative for unusual weight change.  Heme: Negative for bruising or bleeding.   Physical Exam: BP 135/78 mmHg  Pulse 86  Temp(Src) 98  F (36.7 C) (Oral)  Ht 5' 6" (1.676 m)  Wt 182 lb 9.6 oz (82.827 kg)  BMI 29.49 kg/m2 General:   Alert and oriented. Pleasant and cooperative. Well-nourished and well-developed. Appears ill. Head:  Normocephalic and atraumatic. Eyes:  Without icterus, sclera clear and conjunctiva pink.  Ears:  Normal auditory acuity. Cardiovascular:  S1, S2 present. Extremities without clubbing or edema. Respiratory:  Bilateral expiratory wheezes, persistent cough. No rales, or rhonchi. No distress.  Gastrointestinal:  +BS, rounded but soft, non-tender and non-distended. No guarding or rebound. Rectal:  Deferred  Musculoskalatal:  Symmetrical without gross deformities. Skin:  Intact without significant lesions or rashes. Neurologic:  Alert and oriented x4;  grossly normal neurologically. Psych:  Alert and cooperative. Normal mood and affect. Heme/Lymph/Immune: No excessive bruising noted.    10/22/2015 10:31 AM   Disclaimer: This note was dictated with voice recognition software. Similar sounding words can inadvertently be transcribed and may not be corrected upon review.

## 2015-10-22 NOTE — Progress Notes (Signed)
CC'D TO PCP °

## 2015-10-22 NOTE — Assessment & Plan Note (Signed)
Asymptomatic from a hepatic standpoint. Is due for EGD for surveillance of esophageal varices. Currently with wheezing and persistent cough, seen in ER for asthma exacerbation. Increased risk of pulmonary issue on sedation. Will hold off on EGD for right now. Recommend seeing PCP ASAP for breathing issues. Follow-up in 3 months to re-evaluate.

## 2015-10-23 DIAGNOSIS — E784 Other hyperlipidemia: Secondary | ICD-10-CM | POA: Diagnosis not present

## 2015-10-23 DIAGNOSIS — J449 Chronic obstructive pulmonary disease, unspecified: Secondary | ICD-10-CM | POA: Diagnosis not present

## 2015-10-23 DIAGNOSIS — B182 Chronic viral hepatitis C: Secondary | ICD-10-CM | POA: Diagnosis not present

## 2015-10-23 DIAGNOSIS — I10 Essential (primary) hypertension: Secondary | ICD-10-CM | POA: Diagnosis not present

## 2015-10-27 ENCOUNTER — Ambulatory Visit (INDEPENDENT_AMBULATORY_CARE_PROVIDER_SITE_OTHER): Payer: Commercial Managed Care - HMO | Admitting: Orthopedic Surgery

## 2015-10-27 ENCOUNTER — Encounter: Payer: Self-pay | Admitting: Orthopedic Surgery

## 2015-10-27 VITALS — BP 138/87 | Ht 66.0 in | Wt 182.0 lb

## 2015-10-27 DIAGNOSIS — Z96652 Presence of left artificial knee joint: Secondary | ICD-10-CM

## 2015-10-27 DIAGNOSIS — G894 Chronic pain syndrome: Secondary | ICD-10-CM | POA: Diagnosis not present

## 2015-10-27 DIAGNOSIS — M5442 Lumbago with sciatica, left side: Secondary | ICD-10-CM | POA: Diagnosis not present

## 2015-10-27 NOTE — Progress Notes (Signed)
Patient ID: Colleen Lee, female   DOB: Oct 08, 1955, 60 y.o.   MRN: [id number]  Chief Complaint  Patient presents with  . Follow-up    follow up left knee and back    HPI status post left total knees history of lumbar disc disease degenerative arthritis nonsurgical had a yes size back in 2014 currently having chronic back pain and left leg pain. Her current medications are hydrocodone 7.5 cyclobenzaprine 10 mg and 800 mg of ibuprofen  Overall knee function is normal she complains of left knee pain but it's actually left lateral leg pain radiates to the knee  Review of Systems  Constitutional: Negative for fever and chills.  Neurological: Positive for tingling and sensory change.       Really has a pain running down her left lateral thigh into the left knee on the lateral side    BP 138/87 mmHg  Ht 5\' 6"  (1.676 m)  Wt 182 lb (82.555 kg)  BMI 29.39 kg/m2  Physical Exam  Constitutional: She is oriented to person, place, and time. She appears well-developed and well-nourished. No distress.  Cardiovascular: Normal rate and intact distal pulses.   Neurological: She is alert and oriented to person, place, and time. She has normal reflexes. She exhibits normal muscle tone. Coordination normal.  Skin: Skin is warm and dry. No rash noted. She is not diaphoretic. No erythema. No pallor.  Psychiatric: She has a normal mood and affect. Her behavior is normal. Judgment and thought content normal.    Left Knee Exam   Tenderness  Left knee tenderness location: Lateral thigh tenderness palpable behind the left thigh down towards the left knee including iliotibial band release tubercle.  Range of Motion  Extension: 0  Left knee flexion: 115.   Muscle Strength   The patient has normal left knee strength.  Tests  Drawer:       Anterior - negative     Posterior - negative Varus: negative Valgus: negative  Other  Scars: present Sensation: decreased Pulse: present Swelling:  none        ASSESSMENT AND PLAN   Encounter Diagnoses  Name Primary?  . Status post left knee replacement Yes  . Low back pain with left-sided sciatica, unspecified back pain laterality   . Chronic pain syndrome     Scheduled for epidural steroid injection L4-L5  Follow-up 3 months

## 2015-10-27 NOTE — Addendum Note (Signed)
Addended by: Baldomero Lamy B on: 10/27/2015 09:41 AM   Modules accepted: Orders

## 2015-10-27 NOTE — Patient Instructions (Signed)
Schedule for esi

## 2015-10-30 ENCOUNTER — Other Ambulatory Visit (HOSPITAL_COMMUNITY): Payer: Self-pay | Admitting: Respiratory Therapy

## 2015-10-30 DIAGNOSIS — J441 Chronic obstructive pulmonary disease with (acute) exacerbation: Secondary | ICD-10-CM

## 2015-11-04 ENCOUNTER — Ambulatory Visit
Admission: RE | Admit: 2015-11-04 | Discharge: 2015-11-04 | Disposition: A | Discharge status: Home / Self Care | Payer: Commercial Managed Care - HMO | Source: Ambulatory Visit | Attending: Orthopedic Surgery | Admitting: Orthopedic Surgery

## 2015-11-04 DIAGNOSIS — M5442 Lumbago with sciatica, left side: Secondary | ICD-10-CM

## 2015-11-04 DIAGNOSIS — M545 Low back pain: Secondary | ICD-10-CM | POA: Diagnosis not present

## 2015-11-04 MED ORDER — METHYLPREDNISOLONE ACETATE 40 MG/ML INJ SUSP (RADIOLOG
120.0000 mg | Freq: Once | INTRAMUSCULAR | Status: AC
  Administered 2015-11-04: 120 mg via EPIDURAL

## 2015-11-04 MED ORDER — IOHEXOL 180 MG/ML  SOLN
1.0000 mL | Freq: Once | INTRAMUSCULAR | Status: AC | PRN
  Administered 2015-11-04: 1 mL via EPIDURAL

## 2015-11-04 NOTE — Discharge Instructions (Signed)

## 2015-11-05 NOTE — Therapy (Signed)
[facility] [place], [place], [address] Phone: [phone]   Fax:  [phone]  Patient Details  Name: Colleen Lee MRN: [id number] Date of Birth: 1955/10/08 Referring Provider:  Carole Civil, MD  Encounter Date: PHYSICAL THERAPY DISCHARGE SUMMARY  Visits from Start of Care: 23  Current functional level related to goals / functional outcomes: Patient has not returned since last skilled session    Remaining deficits: Unable to assess    Education / Equipment: N/A  Plan: Patient agrees to discharge.  Patient goals were partially met. Patient is being discharged due to not returning since the last visit.  ?????       Deniece Ree PT, DPT [facility] [address] [place], [place], [address] Phone: [phone]   Fax:  [phone]

## 2015-11-06 ENCOUNTER — Telehealth: Payer: Self-pay | Admitting: Orthopedic Surgery

## 2015-11-06 ENCOUNTER — Other Ambulatory Visit: Payer: Self-pay | Admitting: *Deleted

## 2015-11-06 MED ORDER — HYDROCODONE-ACETAMINOPHEN 7.5-325 MG PO TABS: 1.0000 | ORAL_TABLET | Freq: Three times a day (TID) | ORAL | Status: DC | PRN

## 2015-11-06 NOTE — Telephone Encounter (Signed)
Patient called for refill of medication: HYDROcodone-acetaminophen ([place]) 7.5-325 MG tablet [id number] - ph# [phone]

## 2015-11-07 ENCOUNTER — Other Ambulatory Visit: Payer: Self-pay | Admitting: *Deleted

## 2015-11-07 ENCOUNTER — Ambulatory Visit (HOSPITAL_COMMUNITY)
Admission: RE | Admit: 2015-11-07 | Discharge: 2015-11-07 | Disposition: A | Discharge status: Home / Self Care | Payer: Commercial Managed Care - HMO | Source: Ambulatory Visit | Attending: Internal Medicine | Admitting: Internal Medicine

## 2015-11-07 DIAGNOSIS — J449 Chronic obstructive pulmonary disease, unspecified: Secondary | ICD-10-CM | POA: Insufficient documentation

## 2015-11-07 MED ORDER — HYDROCODONE-ACETAMINOPHEN 7.5-325 MG PO TABS: 1.0000 | ORAL_TABLET | Freq: Three times a day (TID) | ORAL | Status: DC | PRN

## 2015-11-10 LAB — PULMONARY FUNCTION TEST
DL/VA % pred: 65 %
DL/VA: 3.29 ml/min/mmHg/L
DLCO unc % pred: 35 %
DLCO unc: 9.6 ml/min/mmHg
FEF 25-75 Pre: 0.86 L/sec
FEF2575-%Pred-Pre: 38 %
FEV1-%Pred-Pre: 67 %
FEV1-Pre: 1.52 L
FEV1FVC-%Pred-Pre: 87 %
FEV6-%Pred-Pre: 79 %
FEV6-Pre: 2.21 L
FEV6FVC-%Pred-Pre: 103 %
FVC-%Pred-Pre: 76 %
FVC-Pre: 2.21 L
Pre FEV1/FVC ratio: 69 %
Pre FEV6/FVC Ratio: 100 %

## 2015-11-24 ENCOUNTER — Emergency Department (HOSPITAL_COMMUNITY)
Admission: EM | Admit: 2015-11-24 | Discharge: 2015-11-24 | Disposition: A | Discharge status: Home / Self Care | Payer: Commercial Managed Care - HMO | Attending: Emergency Medicine | Admitting: Emergency Medicine

## 2015-11-24 ENCOUNTER — Emergency Department (HOSPITAL_COMMUNITY): Payer: Commercial Managed Care - HMO

## 2015-11-24 ENCOUNTER — Encounter (HOSPITAL_COMMUNITY): Payer: Self-pay

## 2015-11-24 DIAGNOSIS — K529 Noninfective gastroenteritis and colitis, unspecified: Secondary | ICD-10-CM | POA: Diagnosis not present

## 2015-11-24 DIAGNOSIS — Z79891 Long term (current) use of opiate analgesic: Secondary | ICD-10-CM | POA: Diagnosis not present

## 2015-11-24 DIAGNOSIS — D259 Leiomyoma of uterus, unspecified: Secondary | ICD-10-CM | POA: Diagnosis not present

## 2015-11-24 DIAGNOSIS — R1011 Right upper quadrant pain: Secondary | ICD-10-CM | POA: Diagnosis present

## 2015-11-24 DIAGNOSIS — I10 Essential (primary) hypertension: Secondary | ICD-10-CM | POA: Insufficient documentation

## 2015-11-24 DIAGNOSIS — J45909 Unspecified asthma, uncomplicated: Secondary | ICD-10-CM | POA: Diagnosis not present

## 2015-11-24 DIAGNOSIS — Z79899 Other long term (current) drug therapy: Secondary | ICD-10-CM | POA: Diagnosis not present

## 2015-11-24 DIAGNOSIS — Z7982 Long term (current) use of aspirin: Secondary | ICD-10-CM | POA: Insufficient documentation

## 2015-11-24 DIAGNOSIS — F17210 Nicotine dependence, cigarettes, uncomplicated: Secondary | ICD-10-CM | POA: Insufficient documentation

## 2015-11-24 LAB — COMPREHENSIVE METABOLIC PANEL
ALT: 12 U/L — ABNORMAL LOW (ref 14–54)
AST: 14 U/L — ABNORMAL LOW (ref 15–41)
Albumin: 4.6 g/dL (ref 3.5–5.0)
Alkaline Phosphatase: 66 U/L (ref 38–126)
Anion gap: 9 (ref 5–15)
BUN: 10 mg/dL (ref 6–20)
CO2: 24 mmol/L (ref 22–32)
Calcium: 10.3 mg/dL (ref 8.9–10.3)
Chloride: 106 mmol/L (ref 101–111)
Creatinine, Ser: 0.81 mg/dL (ref 0.44–1.00)
GFR calc Af Amer: >60 mL/min (ref >60)
GFR calc non Af Amer: >60 mL/min (ref >60)
Glucose, Bld: 123 mg/dL — ABNORMAL HIGH (ref 65–99)
Potassium: 4.5 mmol/L (ref 3.5–5.1)
Sodium: 139 mmol/L (ref 135–145)
Total Bilirubin: 0.9 mg/dL (ref 0.3–1.2)
Total Protein: 9 g/dL — ABNORMAL HIGH (ref 6.5–8.1)

## 2015-11-24 LAB — CBC WITH DIFFERENTIAL/PLATELET
Basophils Absolute: 0 10*3/uL (ref 0.0–0.1)
Basophils Relative: 0 %
Eosinophils Absolute: 0 10*3/uL (ref 0.0–0.7)
Eosinophils Relative: 0 %
HCT: 42.8 % (ref 36.0–46.0)
Hemoglobin: 14.7 g/dL (ref 12.0–15.0)
Lymphocytes Relative: 7 %
Lymphs Abs: 0.7 10*3/uL (ref 0.7–4.0)
MCH: 30.8 pg (ref 26.0–34.0)
MCHC: 34.3 g/dL (ref 30.0–36.0)
MCV: 89.5 fL (ref 78.0–100.0)
Monocytes Absolute: 0.5 10*3/uL (ref 0.1–1.0)
Monocytes Relative: 5 %
Neutro Abs: 9.7 10*3/uL — ABNORMAL HIGH (ref 1.7–7.7)
Neutrophils Relative %: 88 %
Platelets: 231 10*3/uL (ref 150–400)
RBC: 4.78 MIL/uL (ref 3.87–5.11)
RDW: 15 % (ref 11.5–15.5)
WBC: 10.9 10*3/uL — ABNORMAL HIGH (ref 4.0–10.5)

## 2015-11-24 MED ORDER — CIPROFLOXACIN HCL 500 MG PO TABS: 500.0000 mg | ORAL_TABLET | Freq: Two times a day (BID) | ORAL | Status: DC

## 2015-11-24 MED ORDER — DIATRIZOATE MEGLUMINE & SODIUM 66-10 % PO SOLN
ORAL | Status: AC
  Filled 2015-11-24: qty 30

## 2015-11-24 MED ORDER — METRONIDAZOLE 500 MG PO TABS: ORAL_TABLET | ORAL | Status: DC

## 2015-11-24 MED ORDER — IOPAMIDOL (ISOVUE-300) INJECTION 61%
100.0000 mL | Freq: Once | INTRAVENOUS | Status: AC | PRN
  Administered 2015-11-24: 100 mL via INTRAVENOUS

## 2015-11-24 MED ORDER — ONDANSETRON 4 MG PO TBDP: ORAL_TABLET | ORAL | Status: DC

## 2015-11-24 MED ORDER — SODIUM CHLORIDE 0.9 % IV BOLUS (SEPSIS)
1000.0000 mL | Freq: Once | INTRAVENOUS | Status: AC
  Administered 2015-11-24: 1000 mL via INTRAVENOUS

## 2015-11-24 MED ORDER — OXYCODONE-ACETAMINOPHEN 5-325 MG PO TABS: 1.0000 | ORAL_TABLET | Freq: Four times a day (QID) | ORAL | Status: DC | PRN

## 2015-11-24 MED ORDER — HYDROMORPHONE HCL 1 MG/ML IJ SOLN
1.0000 mg | Freq: Once | INTRAMUSCULAR | Status: AC
  Administered 2015-11-24: 1 mg via INTRAVENOUS
  Filled 2015-11-24: qty 1

## 2015-11-24 MED ORDER — HYDROMORPHONE HCL 1 MG/ML IJ SOLN
0.5000 mg | Freq: Once | INTRAMUSCULAR | Status: AC
  Administered 2015-11-24: 0.5 mg via INTRAVENOUS
  Filled 2015-11-24: qty 1

## 2015-11-24 MED ORDER — ONDANSETRON HCL 4 MG/2ML IJ SOLN
4.0000 mg | Freq: Once | INTRAMUSCULAR | Status: AC
  Administered 2015-11-24: 4 mg via INTRAVENOUS
  Filled 2015-11-24: qty 2

## 2015-11-24 NOTE — ED Notes (Signed)
MD at bedside. 

## 2015-11-24 NOTE — ED Notes (Signed)
Oral contrast completed.

## 2015-11-24 NOTE — ED Notes (Signed)
NVD started yesterday

## 2015-11-24 NOTE — Discharge Instructions (Signed)
Follow-up with Dr. Legrand Rams later this week

## 2015-11-24 NOTE — ED Provider Notes (Signed)
CSN: [id number]     Arrival date & time 11/24/15  1058 History   First MD Initiated Contact with Patient 11/24/15 1211     Chief Complaint  Patient presents with  . Emesis     (Consider location/radiation/quality/duration/timing/severity/associated sxs/prior Treatment) Patient is a 60 y.o. female presenting with vomiting. The history is provided by the patient (The patient complains of vomiting and some upper abdominal discomfort).  Emesis Severity:  Mild Timing:  Intermittent Quality:  Undigested food Able to tolerate:  Liquids Progression:  Unchanged Chronicity:  New Recent urination:  Normal Context: not self-induced   Relieved by:  Nothing Associated symptoms: diarrhea   Associated symptoms: no abdominal pain and no headaches     Past Medical History  Diagnosis Date  . Hypertension   . Fibroids   . [name]     HCV RNA positive 09/2012  . GERD (gastroesophageal reflux disease)   . H. pylori infection 2014    treated with pylera, had to be treated again as it was not eradicated. Urea breath test then negative after subsequent treatment.   . Asthma   . PONV (postoperative nausea and vomiting)     pt thinks maybe once she had N&V  . Cirrhosis ([place])     Metavir score F4 on elastography 2015  . Fibromyalgia   . Chronic back pain   . Sciatica of left side    Past Surgical History  Procedure Laterality Date  . Knee surgery      left knee  . Colonoscopy  01/18/2008    VOH:YWVPXT rectum.  Long redundant colon, a diminutive sigmoid polyp status post cold biopsy removed. Hyperplastic polyp. Repeat colonoscopy June 2014 due to family history of colon cancer  . Esophagogastroduodenoscopy  01/18/2008    RMR: Normal esophagus, normal  stomach  . Colonoscopy with esophagogastroduodenoscopy (egd) N/A 11/02/2012    GGY:IRSWNIOE gastric mucosa of doubtful, +H.pylori. Incomplete colonoscopy due to patient unable to tolerate exam, proximal colon seen. Patient refused ACBE.  .  Colonoscopy with propofol N/A 03/08/2013    Dr. Gala Romney: colonic polyp-removed as scribed above. Internal Hemorrhoids. Pathology did not reveal any colonic tissue, only mucus. SURVEILLANCE DUE Aug 2019  . Polypectomy N/A 03/08/2013    Procedure: POLYPECTOMY;  Surgeon: Daneil Dolin, MD;  Location: [facility];  Service: Endoscopy;  Laterality: N/A;  . Multiple extractions with alveoloplasty N/A 10/15/2013    Procedure: MULTIPLE EXTRACTION WITH ALVEOLOPLASTY;  Surgeon: Gae Bon, DDS;  Location: [facility];  Service: Oral Surgery;  Laterality: N/A;  . Total knee arthroplasty Left 02/05/2015    Procedure: LEFT TOTAL KNEE ARTHROPLASTY;  Surgeon: Carole Civil, MD;  Location: [facility];  Service: Orthopedics;  Laterality: Left;   Family History  Problem Relation Age of Onset  . Colon cancer Brother        . Asthma      FH  . Multiple myeloma Brother   . Liver cancer Sister   . Prostate cancer Brother   . Pancreatic cancer Brother   . Cancer Other     niece, age 26, primary unknown   Social History  Substance Use Topics  . Smoking status: Current Every Day Smoker -- 1.00 packs/day for 40 years    Types: Cigarettes  . Smokeless tobacco: Never Used     Comment: Smokes one pack of cigarettes daily  . Alcohol Use: No     Comment: former, last Jan 2014   OB History    Gravida Para  Term Preterm AB TAB SAB Ectopic Multiple Living            1     Review of Systems  Constitutional: Negative for appetite change and fatigue.  HENT: Negative for congestion, ear discharge and sinus pressure.   Eyes: Negative for discharge.  Respiratory: Negative for cough.   Cardiovascular: Negative for chest pain.  Gastrointestinal: Positive for vomiting and diarrhea. Negative for abdominal pain.  Genitourinary: Negative for frequency and hematuria.  Musculoskeletal: Negative for back pain.  Skin: Negative for rash.  Neurological: Negative for seizures and headaches.  Psychiatric/Behavioral: Negative for  hallucinations.      Allergies  Penicillins  Home Medications   Prior to Admission medications   Medication Sig Start Date End Date Taking? Authorizing Provider  amLODipine (NORVASC) 10 MG tablet Take 1 tablet by mouth daily. Take along with 5 mg for a total of 15 mg. 08/07/15  Yes Historical Provider, MD  aspirin EC 325 MG EC tablet Take 1 tablet (325 mg total) by mouth 2 (two) times daily. 02/08/15  Yes Carole Civil, MD  Cyanocobalamin (VITAMIN B 12 PO) Take 1 tablet by mouth daily.   Yes Historical Provider, MD  cyclobenzaprine (FLEXERIL) 10 MG tablet Take 1 tablet (10 mg total) by mouth 3 (three) times daily. 07/29/15  Yes Carole Civil, MD  esomeprazole (NEXIUM) 40 MG capsule Take 1 capsule (40 mg total) by mouth every morning. 12/19/14  Yes Orvil Feil, NP  HYDROcodone-acetaminophen (NORCO) 7.5-325 MG tablet Take 1 tablet by mouth every 8 (eight) hours as needed for moderate pain. 11/07/15  Yes Carole Civil, MD  lisinopril (PRINIVIL,ZESTRIL) 20 MG tablet Take 1 tablet by mouth daily. 09/25/15  Yes Historical Provider, MD  megestrol (MEGACE) 40 MG tablet Take 40 mg by mouth daily.   Yes Historical Provider, MD  SPIRIVA HANDIHALER 18 MCG inhalation capsule Place 18 mcg into inhaler and inhale daily. 10/23/15  Yes Historical Provider, MD  benzonatate (TESSALON) 100 MG capsule Take 1 capsule (100 mg total) by mouth every 8 (eight) hours. 10/11/15   [name], MD  ciprofloxacin (CIPRO) 500 MG tablet Take 1 tablet (500 mg total) by mouth 2 (two) times daily. One po bid x 7 days 11/24/15   Milton Ferguson, MD  loperamide (IMODIUM A-D) 2 MG tablet Take 1 tablet (2 mg total) by mouth 4 (four) times daily as needed for diarrhea or loose stools. 07/02/15   Fredia Sorrow, MD  metroNIDAZOLE (FLAGYL) 500 MG tablet One po qid 11/24/15   Milton Ferguson, MD  ondansetron (ZOFRAN ODT) 4 MG disintegrating tablet '4mg'$  ODT q4 hours prn nausea/vomit 11/24/15   Milton Ferguson, MD   oxyCODONE-acetaminophen (PERCOCET/ROXICET) 5-325 MG tablet Take 1 tablet by mouth every 6 (six) hours as needed. 11/24/15   Milton Ferguson, MD  tiZANidine (ZANAFLEX) 2 MG tablet Take 1 tablet (2 mg total) by mouth every 8 (eight) hours as needed. Patient taking differently: Take 2 mg by mouth every 8 (eight) hours as needed for muscle spasms.  02/08/15   Carole Civil, MD   BP 142/88 mmHg  Pulse 62  Temp(Src) 98.6 F (37 C) (Temporal)  Resp 16  Ht '5\' 6"'$  (1.676 m)  Wt 183 lb (83.008 kg)  BMI 29.55 kg/m2  SpO2 100% Physical Exam  Constitutional: She is oriented to person, place, and time. She appears well-developed.  HENT:  Head: Normocephalic.  Eyes: Conjunctivae and EOM are normal. No scleral icterus.  Neck: Neck supple. No  thyromegaly present.  Cardiovascular: Normal rate and regular rhythm.  Exam reveals no gallop and no friction rub.   No murmur heard. Pulmonary/Chest: No stridor. She has no wheezes. She has no rales. She exhibits no tenderness.  Abdominal: She exhibits no distension. There is tenderness. There is no rebound.  Mild tenderness in left upper quadrant and right upper quadrant  Musculoskeletal: Normal range of motion. She exhibits no edema.  Lymphadenopathy:    She has no cervical adenopathy.  Neurological: She is oriented to person, place, and time. She exhibits normal muscle tone. Coordination normal.  Skin: No rash noted. No erythema.  Psychiatric: She has a normal mood and affect. Her behavior is normal.    ED Course  Procedures (including critical care time) Labs Review Labs Reviewed  CBC WITH DIFFERENTIAL/PLATELET - Abnormal; Notable for the following:    WBC 10.9 (*)    Neutro Abs 9.7 (*)    All other components within normal limits  COMPREHENSIVE METABOLIC PANEL - Abnormal; Notable for the following:    Glucose, Bld 123 (*)    Total Protein 9.0 (*)    AST 14 (*)    ALT 12 (*)    All other components within normal limits    Imaging Review Ct  Abdomen Pelvis W Contrast  11/24/2015  CLINICAL DATA:  Lower abdominal pain, nausea and vomiting starting Saturday EXAM: CT ABDOMEN AND PELVIS WITH CONTRAST TECHNIQUE: Multidetector CT imaging of the abdomen and pelvis was performed using the standard protocol following bolus administration of intravenous contrast. CONTRAST:  114m ISOVUE-300 IOPAMIDOL (ISOVUE-300) INJECTION 61% COMPARISON:  07/02/2015 FINDINGS: Lower chest:  Lung bases are unremarkable. Hepatobiliary: Mild fatty infiltration of the liver. Gallbladder is contracted. No calcified gallstones are noted within gallbladder. No intrahepatic biliary ductal dilatation. CBD measures 7 mm in diameter. Pancreas: No focal hepatic mass. Main pancreatic duct measures 3.7 mm in diameter mild prominent in size. No peripancreatic fluid collection. Spleen: Enhanced spleen is unremarkable. Adrenals/Urinary Tract: Enhanced kidneys are symmetrical in size. No hydronephrosis or hydroureter. No adrenal gland mass. Delayed renal images shows bilateral renal symmetrical excretion. Bilateral visualized proximal ureter is unremarkable. The urinary bladder is empty limiting its assessment. Bilateral distal ureter is unremarkable. Stomach/Bowel: There is no gastric outlet obstruction. No small bowel obstruction. No thickened or dilated small bowel loops. No pericecal inflammation. Normal appendix is noted in axial image 48. The terminal ileum is unremarkable. Axial image 21 there is mild thickening of colonic wall in splenic flexure of the colon. Mild stranding of surrounding fat. This is confirmed on coronal image 57 and 58. Mild thickening of the wall of proximal left colon. Study is limited as the colon is empty collapsed. Findings suspicious for nonspecific colitis and clinical correlation is necessary. No distal colonic obstruction. The sigmoid colon is empty collapsed without significant wall thickening. Vascular/Lymphatic: No retroperitoneal or mesenteric adenopathy.  Atherosclerotic calcifications of abdominal aorta and iliac arteries. No aortic aneurysm. Reproductive: Again noted calcified fibroids within uterus. No adnexal masses noted. There is no pelvic free fluid. Other: There is a umbilical hernia containing fat measures 2.5 cm without evidence of acute complication. No ascites or free air. Musculoskeletal: No destructive bony lesions are noted. Sagittal images of the spine shows mild degenerative changes lumbar spine. IMPRESSION: 1. There is nonspecific mild thickening of colonic wall in splenic flexure of the colon. Mild stranding of pericolonic fat. Nonspecific mild thickening of the wall in proximal left colon. Study is limited as the colon is empty collapsed.  Findings highly suspicious for nonspecific colitis. Clinical correlation is necessary. Less likely acute diverticulitis. 2. No ascites or free air. 3. Mild CBD dilatation up to 7 mm. Mild dilatation of main pancreatic duct up to 3.7 mm. No focal pancreatic mass. 4. No small bowel obstruction. No pericecal inflammation. Normal appendix. 5. Again noted calcified fibroids within uterus.  No adnexal mass. 6. No hydronephrosis or hydroureter. Limited assessment of urinary bladder which is empty. Electronically Signed   By: Lahoma Crocker M.D.   On: 11/24/2015 15:54   I have personally reviewed and evaluated these images and lab results as part of my medical decision-making.   EKG Interpretation None      MDM   Final diagnoses:  Colitis    CT scan shows colitis. Patient symptoms improved with pain medicine and nausea medicine. She will be put on Cipro and Flagyl along with some nausea and pain medicine will follow-up with PCP this week    Milton Ferguson, MD 11/24/15 [phone]

## 2015-12-08 ENCOUNTER — Other Ambulatory Visit: Payer: Self-pay | Admitting: *Deleted

## 2015-12-08 ENCOUNTER — Telehealth: Payer: Self-pay | Admitting: Orthopedic Surgery

## 2015-12-08 MED ORDER — HYDROCODONE-ACETAMINOPHEN 7.5-325 MG PO TABS: 1.0000 | ORAL_TABLET | Freq: Three times a day (TID) | ORAL | Status: DC | PRN

## 2015-12-08 NOTE — Telephone Encounter (Signed)
Hydrocodone-Acetaminophen 7.5/325mg   Take 1 tablet by mouth every 8 (eight) hours as needed for moderate pain.

## 2015-12-11 DIAGNOSIS — Z0000 Encounter for general adult medical examination without abnormal findings: Secondary | ICD-10-CM | POA: Diagnosis not present

## 2015-12-11 DIAGNOSIS — B182 Chronic viral hepatitis C: Secondary | ICD-10-CM | POA: Diagnosis not present

## 2015-12-11 DIAGNOSIS — I10 Essential (primary) hypertension: Secondary | ICD-10-CM | POA: Diagnosis not present

## 2015-12-11 DIAGNOSIS — K219 Gastro-esophageal reflux disease without esophagitis: Secondary | ICD-10-CM | POA: Diagnosis not present

## 2015-12-11 DIAGNOSIS — J449 Chronic obstructive pulmonary disease, unspecified: Secondary | ICD-10-CM | POA: Diagnosis not present

## 2015-12-11 DIAGNOSIS — F17200 Nicotine dependence, unspecified, uncomplicated: Secondary | ICD-10-CM | POA: Diagnosis not present

## 2016-01-04 ENCOUNTER — Encounter (HOSPITAL_COMMUNITY): Payer: Self-pay | Admitting: Emergency Medicine

## 2016-01-04 ENCOUNTER — Emergency Department (HOSPITAL_COMMUNITY)
Admission: EM | Admit: 2016-01-04 | Discharge: 2016-01-04 | Disposition: A | Discharge status: Home / Self Care | Payer: Commercial Managed Care - HMO | Attending: Emergency Medicine | Admitting: Emergency Medicine

## 2016-01-04 DIAGNOSIS — Z79899 Other long term (current) drug therapy: Secondary | ICD-10-CM | POA: Diagnosis not present

## 2016-01-04 DIAGNOSIS — R1111 Vomiting without nausea: Secondary | ICD-10-CM | POA: Diagnosis not present

## 2016-01-04 DIAGNOSIS — R112 Nausea with vomiting, unspecified: Secondary | ICD-10-CM | POA: Diagnosis not present

## 2016-01-04 DIAGNOSIS — Z7982 Long term (current) use of aspirin: Secondary | ICD-10-CM | POA: Diagnosis not present

## 2016-01-04 DIAGNOSIS — I10 Essential (primary) hypertension: Secondary | ICD-10-CM | POA: Diagnosis not present

## 2016-01-04 DIAGNOSIS — R197 Diarrhea, unspecified: Secondary | ICD-10-CM | POA: Insufficient documentation

## 2016-01-04 DIAGNOSIS — J45909 Unspecified asthma, uncomplicated: Secondary | ICD-10-CM | POA: Insufficient documentation

## 2016-01-04 DIAGNOSIS — F17210 Nicotine dependence, cigarettes, uncomplicated: Secondary | ICD-10-CM | POA: Insufficient documentation

## 2016-01-04 DIAGNOSIS — R109 Unspecified abdominal pain: Secondary | ICD-10-CM | POA: Insufficient documentation

## 2016-01-04 LAB — URINALYSIS, ROUTINE W REFLEX MICROSCOPIC
Bilirubin Urine: NEGATIVE
Glucose, UA: NEGATIVE mg/dL
Leukocytes, UA: NEGATIVE
Nitrite: NEGATIVE
Protein, ur: 30 mg/dL — AB
Specific Gravity, Urine: 1.015 (ref 1.005–1.030)
pH: 6 (ref 5.0–8.0)

## 2016-01-04 LAB — CBC WITH DIFFERENTIAL/PLATELET
Basophils Absolute: 0 10*3/uL (ref 0.0–0.1)
Basophils Relative: 0 %
Eosinophils Absolute: 0 10*3/uL (ref 0.0–0.7)
Eosinophils Relative: 0 %
HCT: 43.7 % (ref 36.0–46.0)
Hemoglobin: 14.3 g/dL (ref 12.0–15.0)
Lymphocytes Relative: 10 %
Lymphs Abs: 0.7 10*3/uL (ref 0.7–4.0)
MCH: 29.2 pg (ref 26.0–34.0)
MCHC: 32.7 g/dL (ref 30.0–36.0)
MCV: 89.2 fL (ref 78.0–100.0)
Monocytes Absolute: 0.2 10*3/uL (ref 0.1–1.0)
Monocytes Relative: 3 %
Neutro Abs: 6 10*3/uL (ref 1.7–7.7)
Neutrophils Relative %: 87 %
Platelets: 240 10*3/uL (ref 150–400)
RBC: 4.9 MIL/uL (ref 3.87–5.11)
RDW: 14.9 % (ref 11.5–15.5)
WBC: 6.9 10*3/uL (ref 4.0–10.5)

## 2016-01-04 LAB — URINE MICROSCOPIC-ADD ON

## 2016-01-04 LAB — COMPREHENSIVE METABOLIC PANEL
ALT: 14 U/L (ref 14–54)
AST: 17 U/L (ref 15–41)
Albumin: 4.7 g/dL (ref 3.5–5.0)
Alkaline Phosphatase: 66 U/L (ref 38–126)
Anion gap: 9 (ref 5–15)
BUN: <5 mg/dL — ABNORMAL LOW (ref 6–20)
CO2: 20 mmol/L — ABNORMAL LOW (ref 22–32)
Calcium: 10 mg/dL (ref 8.9–10.3)
Chloride: 109 mmol/L (ref 101–111)
Creatinine, Ser: 0.68 mg/dL (ref 0.44–1.00)
GFR calc Af Amer: >60 mL/min (ref >60)
GFR calc non Af Amer: >60 mL/min (ref >60)
Glucose, Bld: 113 mg/dL — ABNORMAL HIGH (ref 65–99)
Potassium: 3.7 mmol/L (ref 3.5–5.1)
Sodium: 138 mmol/L (ref 135–145)
Total Bilirubin: 0.9 mg/dL (ref 0.3–1.2)
Total Protein: 9 g/dL — ABNORMAL HIGH (ref 6.5–8.1)

## 2016-01-04 LAB — LIPASE, BLOOD: Lipase: 19 U/L (ref 11–51)

## 2016-01-04 MED ORDER — SODIUM CHLORIDE 0.9 % IV BOLUS (SEPSIS)
1000.0000 mL | Freq: Once | INTRAVENOUS | Status: AC
Start: 2016-01-04 — End: 2016-01-04
  Administered 2016-01-04: 1000 mL via INTRAVENOUS

## 2016-01-04 MED ORDER — HYDROMORPHONE HCL 1 MG/ML IJ SOLN
1.0000 mg | Freq: Once | INTRAMUSCULAR | Status: AC
  Administered 2016-01-04: 1 mg via INTRAVENOUS
  Filled 2016-01-04: qty 1

## 2016-01-04 MED ORDER — ONDANSETRON HCL 4 MG/2ML IJ SOLN
4.0000 mg | Freq: Once | INTRAMUSCULAR | Status: AC
  Administered 2016-01-04: 4 mg via INTRAVENOUS
  Filled 2016-01-04: qty 2

## 2016-01-04 MED ORDER — SODIUM CHLORIDE 0.9 % IV SOLN: INTRAVENOUS | Status: DC

## 2016-01-04 MED ORDER — SODIUM CHLORIDE 0.9 % IV BOLUS (SEPSIS)
1000.0000 mL | Freq: Once | INTRAVENOUS | Status: AC
  Administered 2016-01-04: 1000 mL via INTRAVENOUS

## 2016-01-04 MED ORDER — OXYCODONE-ACETAMINOPHEN 5-325 MG PO TABS
2.0000 | ORAL_TABLET | Freq: Once | ORAL | Status: AC
  Administered 2016-01-04: 2 via ORAL
  Filled 2016-01-04: qty 2

## 2016-01-04 MED ORDER — METOCLOPRAMIDE HCL 5 MG/ML IJ SOLN
10.0000 mg | Freq: Once | INTRAMUSCULAR | Status: AC
  Administered 2016-01-04: 10 mg via INTRAVENOUS
  Filled 2016-01-04: qty 2

## 2016-01-04 MED ORDER — ACETAMINOPHEN 500 MG PO TABS
1000.0000 mg | ORAL_TABLET | Freq: Once | ORAL | Status: AC
  Administered 2016-01-04: 1000 mg via ORAL
  Filled 2016-01-04: qty 2

## 2016-01-04 NOTE — ED Notes (Signed)
Patient aware a urine sample is needed states she can not give one at this time.

## 2016-01-04 NOTE — ED Notes (Signed)
Pt refused for lab to draw more blood to run rest of tests, EDP [facility] is aware of pt's refusal

## 2016-01-04 NOTE — ED Provider Notes (Signed)
CSN: [id number]     Arrival date & time 01/04/16  1502 History   First MD Initiated Contact with Patient 01/04/16 1506     Chief Complaint  Patient presents with  . Emesis     (Consider location/radiation/quality/duration/timing/severity/associated sxs/prior Treatment) Patient is a 60 y.o. female presenting with vomiting.  Emesis Severity:  Mild Timing:  Constant Quality:  Stomach contents Progression:  Unchanged Chronicity:  Recurrent Ineffective treatments:  Antiemetics Associated symptoms: abdominal pain, chills, diarrhea and fever   Associated symptoms: no myalgias and no sore throat   Risk factors: no alcohol use, no diabetes, no sick contacts, no suspect food intake and no travel to endemic areas     Past Medical History  Diagnosis Date  . Hypertension   . Fibroids   . [name]     HCV RNA positive 09/2012  . GERD (gastroesophageal reflux disease)   . H. pylori infection 2014    treated with pylera, had to be treated again as it was not eradicated. Urea breath test then negative after subsequent treatment.   . Asthma   . PONV (postoperative nausea and vomiting)     pt thinks maybe once she had N&V  . Cirrhosis ([place])     Metavir score F4 on elastography 2015  . Fibromyalgia   . Chronic back pain   . Sciatica of left side    Past Surgical History  Procedure Laterality Date  . Knee surgery      left knee  . Colonoscopy  01/18/2008    EQA:STMHDQ rectum.  Long redundant colon, a diminutive sigmoid polyp status post cold biopsy removed. Hyperplastic polyp. Repeat colonoscopy June 2014 due to family history of colon cancer  . Esophagogastroduodenoscopy  01/18/2008    RMR: Normal esophagus, normal  stomach  . Colonoscopy with esophagogastroduodenoscopy (egd) N/A 11/02/2012    QIW:[id number] gastric mucosa of doubtful, +H.pylori. Incomplete colonoscopy due to patient unable to tolerate exam, proximal colon seen. Patient refused ACBE.  . Colonoscopy with propofol N/A  03/08/2013    Dr. Gala Romney: colonic polyp-removed as scribed above. Internal Hemorrhoids. Pathology did not reveal any colonic tissue, only mucus. SURVEILLANCE DUE Aug 2019  . Polypectomy N/A 03/08/2013    Procedure: POLYPECTOMY;  Surgeon: Daneil Dolin, MD;  Location: [facility];  Service: Endoscopy;  Laterality: N/A;  . Multiple extractions with alveoloplasty N/A 10/15/2013    Procedure: MULTIPLE EXTRACTION WITH ALVEOLOPLASTY;  Surgeon: Gae Bon, DDS;  Location: [facility];  Service: Oral Surgery;  Laterality: N/A;  . Total knee arthroplasty Left 02/05/2015    Procedure: LEFT TOTAL KNEE ARTHROPLASTY;  Surgeon: Carole Civil, MD;  Location: [facility];  Service: Orthopedics;  Laterality: Left;   Family History  Problem Relation Age of Onset  . Colon cancer Brother        . Asthma      FH  . Multiple myeloma Brother   . Liver cancer Sister   . Prostate cancer Brother   . Pancreatic cancer Brother   . Cancer Other     niece, age 44, primary unknown   Social History  Substance Use Topics  . Smoking status: Current Every Day Smoker -- 1.00 packs/day for 40 years    Types: Cigarettes  . Smokeless tobacco: Never Used     Comment: Smokes one pack of cigarettes daily  . Alcohol Use: No     Comment: former, last Jan 2014   OB History    Gravida Para Term Preterm AB TAB  SAB Ectopic Multiple Living            1     Review of Systems  Constitutional: Positive for chills.  HENT: Negative for sore throat.   Gastrointestinal: Positive for vomiting, abdominal pain and diarrhea.  Musculoskeletal: Negative for myalgias.  All other systems reviewed and are negative.     Allergies  Penicillins  Home Medications   Prior to Admission medications   Medication Sig Start Date End Date Taking? Authorizing Provider  amLODipine (NORVASC) 10 MG tablet Take 1 tablet by mouth daily. Take along with 5 mg for a total of 15 mg. 08/07/15  Yes Historical Provider, MD  aspirin EC 325 MG EC tablet Take 1 tablet  (325 mg total) by mouth 2 (two) times daily. 02/08/15  Yes Carole Civil, MD  Cyanocobalamin (VITAMIN B 12 PO) Take 1 tablet by mouth daily.   Yes Historical Provider, MD  cyclobenzaprine (FLEXERIL) 10 MG tablet Take 1 tablet (10 mg total) by mouth 3 (three) times daily. 07/29/15  Yes Carole Civil, MD  esomeprazole (NEXIUM) 40 MG capsule Take 1 capsule (40 mg total) by mouth every morning. 12/19/14  Yes Orvil Feil, NP  HYDROcodone-acetaminophen (NORCO) 7.5-325 MG tablet Take 1 tablet by mouth every 8 (eight) hours as needed for moderate pain. 12/08/15  Yes Carole Civil, MD  lisinopril (PRINIVIL,ZESTRIL) 20 MG tablet Take 1 tablet by mouth daily. 09/25/15  Yes Historical Provider, MD  megestrol (MEGACE) 40 MG tablet Take 40 mg by mouth daily.   Yes Historical Provider, MD  ondansetron (ZOFRAN ODT) 4 MG disintegrating tablet '4mg'$  ODT q4 hours prn nausea/vomit 11/24/15  Yes Milton Ferguson, MD  SPIRIVA HANDIHALER 18 MCG inhalation capsule Place 18 mcg into inhaler and inhale daily. 10/23/15  Yes Historical Provider, MD  benzonatate (TESSALON) 100 MG capsule Take 1 capsule (100 mg total) by mouth every 8 (eight) hours. 10/11/15   [name], MD  ciprofloxacin (CIPRO) 500 MG tablet Take 1 tablet (500 mg total) by mouth 2 (two) times daily. One po bid x 7 days 11/24/15   Milton Ferguson, MD  loperamide (IMODIUM A-D) 2 MG tablet Take 1 tablet (2 mg total) by mouth 4 (four) times daily as needed for diarrhea or loose stools. 07/02/15   Fredia Sorrow, MD  metroNIDAZOLE (FLAGYL) 500 MG tablet One po qid 11/24/15   Milton Ferguson, MD  oxyCODONE-acetaminophen (PERCOCET/ROXICET) 5-325 MG tablet Take 1 tablet by mouth every 6 (six) hours as needed. 11/24/15   Milton Ferguson, MD  tiZANidine (ZANAFLEX) 2 MG tablet Take 1 tablet (2 mg total) by mouth every 8 (eight) hours as needed. Patient taking differently: Take 2 mg by mouth every 8 (eight) hours as needed for muscle spasms.  02/08/15   Carole Civil, MD    BP 181/108 mmHg  Pulse 88  Temp(Src) 99 F (37.2 C) (Oral)  Resp 23  Ht '5\' 6"'$  (1.676 m)  Wt 190 lb (86.183 kg)  BMI 30.68 kg/m2  SpO2 100% Physical Exam  Constitutional: She appears well-developed and well-nourished.  HENT:  Head: Normocephalic and atraumatic.  Mouth/Throat: Mucous membranes are dry.  Neck: Normal range of motion.  Cardiovascular: Normal rate and regular rhythm.   Pulmonary/Chest: No stridor. No respiratory distress.  Abdominal: She exhibits no distension. There is no rigidity, no rebound and no guarding.  Neurological: She is alert.  Nursing note and vitals reviewed.   ED Course  Procedures (including critical care time) Labs Review Labs Reviewed  URINALYSIS, ROUTINE W REFLEX MICROSCOPIC (NOT AT [facility]) - Abnormal; Notable for the following:    Hgb urine dipstick TRACE (*)    Ketones, ur TRACE (*)    Protein, ur 30 (*)    All other components within normal limits  COMPREHENSIVE METABOLIC PANEL - Abnormal; Notable for the following:    CO2 20 (*)    Glucose, Bld 113 (*)    BUN <5 (*)    Total Protein 9.0 (*)    All other components within normal limits  URINE MICROSCOPIC-ADD ON - Abnormal; Notable for the following:    Squamous Epithelial / LPF 6-30 (*)    Bacteria, UA FEW (*)    All other components within normal limits  CBC WITH DIFFERENTIAL/PLATELET  LIPASE, BLOOD    Imaging Review No results found. I have personally reviewed and evaluated these images and lab results as part of my medical decision-making.   EKG Interpretation   Date/Time:  Sunday January 04 2016 18:59:49 EDT Ventricular Rate:  95 PR Interval:  124 QRS Duration: 82 QT Interval:  360 QTC Calculation: 452 R Axis:   67 Text Interpretation:  Sinus rhythm Ventricular premature complex Consider  left ventricular hypertrophy No significant change since last tracing  Confirmed by [facility] MD, Corene Cornea [id number]) on 01/04/2016 7:14:00 PM      MDM   Final diagnoses:  Non-intractable  vomiting without nausea, vomiting of unspecified type   60 yo F  Here with vomiting, diarrhea, abdominal pain startign after vomiting. Slightly dry on exam. Abdomen benign, non peritonitic.  Likely gastroenteritis. Has had the exact same symptoms previously without acute causes found on ct scans. Will plan for symptomatic treatment for time being. If abdominal exam changes or not able to control pain, will consider ct scan or other imaging at that time.  Patient was self-induced dry heaving but no other vomiting while in the emergency department. Labs are unremarkable. Abdomen remains benign. Patient requesting more pain medication however without any acute causes for symptoms if it is more related to chronic abdominal pain and she will be discharged to take her medications for chronic pain at home.  New Prescriptions: Discharge Medication List as of 01/04/2016  7:58 PM      I have personally and contemperaneously reviewed labs and imaging and used in my decision making as above.   A medical screening exam was performed and I feel the patient has had an appropriate workup for their chief complaint at this time and likelihood of emergent condition existing is low and thus workup can continue on an outpatient basis.. Their vital signs are stable. They have been counseled on decision, discharge, follow up and which symptoms necessitate immediate return to the emergency department.  They verbally stated understanding and agreement with plan and discharged in stable condition.      Merrily Pew, MD 01/04/16 [phone]

## 2016-01-04 NOTE — ED Notes (Signed)
Edp made aware of pt's request for pain med

## 2016-01-04 NOTE — ED Notes (Signed)
Pt states she has been having n/v abd pain since last night.  Dry heaving at triage.

## 2016-01-07 ENCOUNTER — Other Ambulatory Visit: Payer: Self-pay | Admitting: *Deleted

## 2016-01-07 ENCOUNTER — Telehealth: Payer: Self-pay | Admitting: Orthopedic Surgery

## 2016-01-07 MED ORDER — HYDROCODONE-ACETAMINOPHEN 7.5-325 MG PO TABS: 1.0000 | ORAL_TABLET | Freq: Three times a day (TID) | ORAL | Status: DC | PRN

## 2016-01-07 NOTE — Telephone Encounter (Signed)
Hydrocodone-Acetaminophen 7.5/325mg  Qty 90 Tablets  Take 1 tablet by mouth every 8 (eight) hours as needed for moderate pain.

## 2016-01-07 NOTE — Telephone Encounter (Signed)
Prescription available 

## 2016-01-09 DIAGNOSIS — B182 Chronic viral hepatitis C: Secondary | ICD-10-CM | POA: Diagnosis not present

## 2016-01-09 DIAGNOSIS — F17200 Nicotine dependence, unspecified, uncomplicated: Secondary | ICD-10-CM | POA: Diagnosis not present

## 2016-01-09 DIAGNOSIS — I10 Essential (primary) hypertension: Secondary | ICD-10-CM | POA: Diagnosis not present

## 2016-01-09 DIAGNOSIS — J449 Chronic obstructive pulmonary disease, unspecified: Secondary | ICD-10-CM | POA: Diagnosis not present

## 2016-01-21 ENCOUNTER — Encounter: Payer: Self-pay | Admitting: Nurse Practitioner

## 2016-01-21 ENCOUNTER — Encounter: Payer: Self-pay | Admitting: Internal Medicine

## 2016-01-21 ENCOUNTER — Ambulatory Visit (INDEPENDENT_AMBULATORY_CARE_PROVIDER_SITE_OTHER): Payer: Commercial Managed Care - HMO | Admitting: Nurse Practitioner

## 2016-01-21 ENCOUNTER — Other Ambulatory Visit: Payer: Self-pay

## 2016-01-21 VITALS — BP 116/74 | HR 72 | Temp 99.3°F | Ht 66.0 in | Wt 182.2 lb

## 2016-01-21 DIAGNOSIS — J45909 Unspecified asthma, uncomplicated: Secondary | ICD-10-CM | POA: Insufficient documentation

## 2016-01-21 DIAGNOSIS — J4520 Mild intermittent asthma, uncomplicated: Secondary | ICD-10-CM

## 2016-01-21 DIAGNOSIS — B182 Chronic viral hepatitis C: Secondary | ICD-10-CM | POA: Diagnosis not present

## 2016-01-21 DIAGNOSIS — K5900 Constipation, unspecified: Secondary | ICD-10-CM | POA: Diagnosis not present

## 2016-01-21 DIAGNOSIS — K7460 Unspecified cirrhosis of liver: Secondary | ICD-10-CM | POA: Diagnosis not present

## 2016-01-21 NOTE — Progress Notes (Signed)
Referring Provider: Rosita Fire, MD Primary Care Physician:  Rosita Fire, MD Primary GI:  Dr. Gala Romney  Chief Complaint  Patient presents with  . Follow-up    HPI:   Colleen Lee is a 60 y.o. female who presents for follow-up on cirrhosis. Strip [name] infection documented SVR on 09/22/2015. She was last seen in our office 10/22/2015 for the same. She was due for variceal screening in February 2017. At that time she noted she had to postpone her upper endoscopy for variceal screening due to "getting a cold." Was still having breathing issues at that time with shortness of breath walking short distances, still on inhalers and other medications from the emergency department. She was generally asymptomatic from a GI standpoint. It was decided to further hold off on EGD at that time due to resolving upper respiratory infection with asthma exacerbation. Recommended she see her primary care as soon as possible, follow-up in 3 months to reevaluate and schedule upper endoscopy. Of note she was seen in the emergency department twice in the past month for nausea/vomiting, diagnosed with colitis and/or gastroenteritis and started on Cipro/Flagyl.  Last abdominal imaging completed 11/24/2015 while in the emergency department with no noted liver lesions. Last PT/INR completed 09/22/2015 which is normal, CMP most recently completed 11/24/2015 with normal/low LFTs, total bili 0.9, albumin 4.6. She will next be due for liver labs at the end of August.  Today she states she's doing ok overall. Has is having worsening constipation, has a history of this. Thinks her medications are "stopping me up." Will have the urge to have a bowel movement but unable to go unless she takes an OTC laxatives or Epsom salt. Is on chronic pain medications. Denies hematochezia, melena. Does have some crampy abdomianl pain and some nausea associated with her constipation. Pain and nausea resolves after a bowel movement. Denies  yellowing of skin/eyes, darkened urine, acute episodic confusion, hematemesis, tremors. Denies chest pain, dyspnea, dizziness, lightheadedness, syncope, near syncope. Denies any other upper or lower GI symptoms.  Feels breathing is "a whole lot better" now and is comfortable with having due EGD for variceal screening.  Past Medical History  Diagnosis Date  . Hypertension   . Fibroids   . [name]     HCV RNA positive 09/2012  . GERD (gastroesophageal reflux disease)   . H. pylori infection 2014    treated with pylera, had to be treated again as it was not eradicated. Urea breath test then negative after subsequent treatment.   . Asthma   . PONV (postoperative nausea and vomiting)     pt thinks maybe once she had N&V  . Cirrhosis ([place])     Metavir score F4 on elastography 2015  . Fibromyalgia   . Chronic back pain   . Sciatica of left side     Past Surgical History  Procedure Laterality Date  . Knee surgery      left knee  . Colonoscopy  01/18/2008    GDJ:MEQAST rectum.  Long redundant colon, a diminutive sigmoid polyp status post cold biopsy removed. Hyperplastic polyp. Repeat colonoscopy June 2014 due to family history of colon cancer  . Esophagogastroduodenoscopy  01/18/2008    RMR: Normal esophagus, normal  stomach  . Colonoscopy with esophagogastroduodenoscopy (egd) N/A 11/02/2012    [id number]:[id number] gastric mucosa of doubtful, +H.pylori. Incomplete colonoscopy due to patient unable to tolerate exam, proximal colon seen. Patient refused ACBE.  . Colonoscopy with propofol N/A 03/08/2013    Dr.  Rourk: colonic polyp-removed as scribed above. Internal Hemorrhoids. Pathology did not reveal any colonic tissue, only mucus. SURVEILLANCE DUE Aug 2019  . Polypectomy N/A 03/08/2013    Procedure: POLYPECTOMY;  Surgeon: Daneil Dolin, MD;  Location: [facility];  Service: Endoscopy;  Laterality: N/A;  . Multiple extractions with alveoloplasty N/A 10/15/2013    Procedure: MULTIPLE EXTRACTION WITH  ALVEOLOPLASTY;  Surgeon: Gae Bon, DDS;  Location: [facility];  Service: Oral Surgery;  Laterality: N/A;  . Total knee arthroplasty Left 02/05/2015    Procedure: LEFT TOTAL KNEE ARTHROPLASTY;  Surgeon: Carole Civil, MD;  Location: [facility];  Service: Orthopedics;  Laterality: Left;    Current Outpatient Prescriptions  Medication Sig Dispense Refill  . amLODipine (NORVASC) 10 MG tablet Take 1 tablet by mouth daily. Take along with 5 mg for a total of 15 mg.    . aspirin EC 325 MG EC tablet Take 1 tablet (325 mg total) by mouth 2 (two) times daily. 60 tablet 0  . benzonatate (TESSALON) 100 MG capsule Take 1 capsule (100 mg total) by mouth every 8 (eight) hours. 21 capsule 0  . budesonide-formoterol (SYMBICORT) 160-4.5 MCG/ACT inhaler Inhale 2 puffs into the lungs 2 (two) times daily.    . ciprofloxacin (CIPRO) 500 MG tablet Take 1 tablet (500 mg total) by mouth 2 (two) times daily. One po bid x 7 days 29 tablet 0  . Cyanocobalamin (VITAMIN B 12 PO) Take 1 tablet by mouth daily.    . cyclobenzaprine (FLEXERIL) 10 MG tablet Take 1 tablet (10 mg total) by mouth 3 (three) times daily. 90 tablet 5  . esomeprazole (NEXIUM) 40 MG capsule Take 1 capsule (40 mg total) by mouth every morning. 10 capsule 0  . HYDROcodone-acetaminophen (NORCO) 7.5-325 MG tablet Take 1 tablet by mouth every 8 (eight) hours as needed for moderate pain. 90 tablet 0  . lisinopril (PRINIVIL,ZESTRIL) 20 MG tablet Take 1 tablet by mouth daily.    . megestrol (MEGACE) 40 MG tablet Take 40 mg by mouth daily.    . metroNIDAZOLE (FLAGYL) 500 MG tablet One po qid 40 tablet 0  . ondansetron (ZOFRAN ODT) 4 MG disintegrating tablet '4mg'$  ODT q4 hours prn nausea/vomit 12 tablet 0  . oxyCODONE-acetaminophen (PERCOCET/ROXICET) 5-325 MG tablet Take 1 tablet by mouth every 6 (six) hours as needed. 20 tablet 0  . SPIRIVA HANDIHALER 18 MCG inhalation capsule Place 18 mcg into inhaler and inhale daily.    Marland Kitchen tiZANidine (ZANAFLEX) 2 MG tablet Take  1 tablet (2 mg total) by mouth every 8 (eight) hours as needed. (Patient taking differently: Take 2 mg by mouth every 8 (eight) hours as needed for muscle spasms. ) 60 tablet 0   No current facility-administered medications for this visit.    Allergies as of 01/21/2016 - Review Complete 01/21/2016  Allergen Reaction Noted  . Penicillins Rash     Family History  Problem Relation Age of Onset  . Colon cancer Brother 31       . Asthma      FH  . Multiple myeloma Brother   . Liver cancer Sister   . Prostate cancer Brother   . Pancreatic cancer Brother   . Cancer Other     niece, age 20, primary unknown    Social History   Social History  . Marital Status: Married    Spouse Name: N/A  . Number of Children: 0  . Years of Education: N/A   Occupational History  .  umemployed     Social History Main Topics  . Smoking status: Current Every Day Smoker -- 1.00 packs/day for 40 years    Types: Cigarettes  . Smokeless tobacco: Never Used     Comment: Smokes one pack of cigarettes daily  . Alcohol Use: No     Comment: former, last Jan 2014  . Drug Use: No     Comment: history of marijuana in past  . Sexual Activity: No   Other Topics Concern  . None   Social History Narrative    Review of Systems: General: Negative for anorexia, weight loss, fever, chills, fatigue, weakness. ENT: Negative for hoarseness, difficulty swallowing. CV: Negative for chest pain, angina, palpitations, peripheral edema.  Respiratory: Negative for dyspnea at rest, cough, sputum, wheezing.  GI: See history of present illness. Neuro: Negative for memory loss, confusion.  Endo: Negative for unusual weight change.  Heme: Negative for bruising or bleeding.   Physical Exam: BP 116/74 mmHg  Pulse 72  Temp(Src) 99.3 F (37.4 C)  Ht '5\' 6"'$  (1.676 m)  Wt 182 lb 3.2 oz (82.645 kg)  BMI 29.42 kg/m2 General:   Alert and oriented. Pleasant and cooperative. Well-nourished and well-developed.  Eyes:   Without icterus, sclera clear and conjunctiva pink.  Ears:  Normal auditory acuity. Cardiovascular:  S1, S2 present without murmurs appreciated. Extremities without clubbing or edema. Respiratory:  Clear to auscultation bilaterally. No wheezes, rales, or rhonchi. No distress.  Gastrointestinal:  +BS, rounded but soft, non-tender and non-distended. No guarding or rebound. Rectal:  Deferred  Musculoskalatal:  Symmetrical without gross deformities. Neurologic:  Alert and oriented x4;  grossly normal neurologically. Psych:  Alert and cooperative. Normal mood and affect. Heme/Lymph/Immune: No excessive bruising noted.    01/21/2016 9:41 AM   Disclaimer: This note was dictated with voice recognition software. Similar sounding words can inadvertently be transcribed and may not be corrected upon review.

## 2016-01-21 NOTE — Progress Notes (Signed)
CC'ED TO PCP 

## 2016-01-21 NOTE — Addendum Note (Signed)
Addended by: Gordy Levan, Jaemarie Hochberg A on: 01/21/2016 09:44 AM   Modules accepted: Orders

## 2016-01-21 NOTE — Patient Instructions (Signed)
1. Have your labs and ultrasound completed at the end of August/early September 2017.  2. We will schedule your procedure for you. 3. I'm sending and Amitiza to help you with constipation likely due to pain medications. Take this pill twice a day, after eating half a meal (on a full stomach).  4. Call us with a progress report on how the Amitiza is working in 2 weeks. 5. Return for follow-up in 6 months.

## 2016-01-21 NOTE — Assessment & Plan Note (Signed)
[name] status post treatment with Harvoni and documented sustained viral response (SVR). Continue to monitor. Recommend avoid alcohol and drugs.

## 2016-01-21 NOTE — Assessment & Plan Note (Signed)
Is due for labs and repeat imaging at the end of August of this year. I will put in future orders for this. Return for follow-up in 6 months. Is due for surveillance endoscopy for variceal screening. Her breathing is much improved and we can proceed with this at this time.  Proceed with EGD with Dr. Gala Romney in near future: the risks, benefits, and alternatives have been discussed with the patient in detail. The patient states understanding and desires to proceed.  The patient is not on any anticoagulants, anxiolytics, antidepressants. She is on pain medications for chronic pain. We will schedule the procedure with propofol/MAC to promote adequate sedation.

## 2016-01-21 NOTE — Assessment & Plan Note (Signed)
Endoscopy previously postponed due to sequela of acute exacerbation of asthma with difficulty breathing. Her breathing is very much improved today, no wheezing noted on exam, states she is breathing much better with her new medications. At this point there is no increased risk for endoscopy based on her asthma. Endoscopy as noted below.

## 2016-01-21 NOTE — Assessment & Plan Note (Signed)
Chronic history of constipation which is becoming worse on chronic pain medications. At this point we'll trial her on Amitiza 24 g twice a day with food. We will provide samples for 2 weeks and ask her to call for the progress report at that time. If the medication is effective we can send in a prescription, if not we can change regimens. Return for follow-up in 6 months.

## 2016-01-26 ENCOUNTER — Ambulatory Visit: Payer: Commercial Managed Care - HMO | Admitting: Orthopedic Surgery

## 2016-01-29 ENCOUNTER — Ambulatory Visit (INDEPENDENT_AMBULATORY_CARE_PROVIDER_SITE_OTHER): Payer: Commercial Managed Care - HMO | Admitting: Orthopedic Surgery

## 2016-01-29 ENCOUNTER — Encounter: Payer: Self-pay | Admitting: Orthopedic Surgery

## 2016-01-29 ENCOUNTER — Telehealth: Payer: Self-pay | Admitting: Orthopaedic Surgery

## 2016-01-29 VITALS — BP 136/84 | Ht 66.0 in | Wt 182.0 lb

## 2016-01-29 DIAGNOSIS — G894 Chronic pain syndrome: Secondary | ICD-10-CM | POA: Diagnosis not present

## 2016-01-29 DIAGNOSIS — M1732 Unilateral post-traumatic osteoarthritis, left knee: Secondary | ICD-10-CM

## 2016-01-29 DIAGNOSIS — Z7189 Other specified counseling: Secondary | ICD-10-CM

## 2016-01-29 DIAGNOSIS — M5410 Radiculopathy, site unspecified: Secondary | ICD-10-CM

## 2016-01-29 DIAGNOSIS — G8929 Other chronic pain: Secondary | ICD-10-CM

## 2016-01-29 DIAGNOSIS — S82142P Displaced bicondylar fracture of left tibia, subsequent encounter for closed fracture with malunion: Secondary | ICD-10-CM | POA: Diagnosis not present

## 2016-01-29 MED ORDER — HYDROCODONE-ACETAMINOPHEN 7.5-325 MG PO TABS: 1.0000 | ORAL_TABLET | Freq: Three times a day (TID) | ORAL | Status: DC | PRN

## 2016-01-29 NOTE — Addendum Note (Signed)
Addended by: Baldomero Lamy B on: 01/29/2016 03:52 PM   Modules accepted: Orders

## 2016-01-29 NOTE — Progress Notes (Signed)
Patient ID: JABREIA DELACRUZ, female   DOB: 1955-11-07, 60 y.o.   MRN: [id number]  Chief Complaint  Patient presents with  . Follow-up    CHRONIC BACK PAIN    HPI 60-year-old female with long-standing history of chronic back pain and left knee pain after tibial plateau fracture. Patient does not want have back surgery has lumbar disc disease  Currently followed for chronic pain  Current medications for her back include Zanaflex Flexeril and hydrocodone 7.5 mg which controls her pain  Review of Systems  Constitutional: Negative for fever and chills.  Musculoskeletal: Positive for back pain.  Neurological: Positive for tingling and sensory change.      BP 136/84 mmHg  Ht 5\' 6"  (1.676 m)  Wt 182 lb (82.555 kg)  BMI 29.39 kg/m2 Gen. appearance is normal grooming and hygiene Orientation to person place and time normal Mood normal Gait is abnormal No peripheral edema or swelling is noted in the left leg Sensory exam shows ABnormal sensation to palpation left leg Skin exam no lacerations ulcerations or erythema  Ortho Exam  Anterior incision over the left knee from prior surgery. Left knee show small effusion lateral joint line tenderness  Left leg lateral thigh tenderness and lower back pain tenderness A/P  Medical decision-making  Chronic pain Arthritis left knee Lumbar disc disease  Urine drug screen Refill Norco 7.5 every 8  Return 1 month  Arther Abbott, MD 01/29/2016 3:41 PM

## 2016-01-29 NOTE — Telephone Encounter (Signed)
Called to patient regarding referral for appointment with Dr Aline Brochure for today, 01/29/16 - appears updated referral from primary insurer, [employer], is needed.  Also working with [employer] on-line system.

## 2016-01-30 NOTE — Patient Instructions (Signed)
Colleen Lee  01/30/2016     @PREFPERIOPPHARMACY @   Your procedure is scheduled on 02/09/2016.  Report to [facility] at 6:15 A.M.  Call this number if you have problems the morning of surgery:  [phone]   Remember:  Do not eat food or drink liquids after midnight.  Take these medicines the morning of surgery with A SIP OF WATER : NORVASC, FLEXERIL, NEXIUM, NORCO, LISINOPRIL, ZOFRAN AND PERCOCET.  PLEASE USE YOUR INHALERS AND NEBULIZERS PRIOR TO COMING TO THE HOSPITAL AND BRING THEM WITH YOU.   Do not wear jewelry, make-up or nail polish.  Do not wear lotions, powders, or perfumes.  You may wear deoderant.  Do not shave 48 hours prior to surgery.  Men may shave face and neck.  Do not bring valuables to the hospital.  [facility] is not responsible for any belongings or valuables.  Contacts, dentures or bridgework may not be worn into surgery.  Leave your suitcase in the car.  After surgery it may be brought to your room.  For patients admitted to the hospital, discharge time will be determined by your treatment team.  Patients discharged the day of surgery will not be allowed to drive home.   Name and phone number of your driver:   FAMILY Special instructions:  N/A  Please read over the following fact sheets that you were given. Care and Recovery After Surgery    General Anesthesia, Adult General anesthesia is a sleep-like state of non-feeling produced by medicines (anesthetics). General anesthesia prevents you from being alert and feeling pain during a medical procedure. Your caregiver may recommend general anesthesia if your procedure:  Is long.  Is painful or uncomfortable.  Would be frightening to see or hear.  Requires you to be still.  Affects your breathing.  Causes significant blood loss. LET YOUR CAREGIVER KNOW ABOUT:  Allergies to food or medicine.  Medicines taken, including vitamins, herbs, eyedrops, over-the-counter medicines, and creams.  Use  of steroids (by mouth or creams).  Previous problems with anesthetics or numbing medicines, including problems experienced by relatives.  History of bleeding problems or blood clots.  Previous surgeries and types of anesthetics received.  Possibility of pregnancy, if this applies.  Use of cigarettes, alcohol, or illegal drugs.  Any health condition(s), especially diabetes, sleep apnea, and high blood pressure. RISKS AND COMPLICATIONS General anesthesia rarely causes complications. However, if complications do occur, they can be life threatening. Complications include:  A lung infection.  A stroke.  A heart attack.  Waking up during the procedure. When this occurs, the patient may be unable to move and communicate that he or she is awake. The patient may feel severe pain. Older adults and adults with serious medical problems are more likely to have complications than adults who are young and healthy. Some complications can be prevented by answering all of your caregiver's questions thoroughly and by following all pre-procedure instructions. It is important to tell your caregiver if any of the pre-procedure instructions, especially those related to diet, were not followed. Any food or liquid in the stomach can cause problems when you are under general anesthesia. BEFORE THE PROCEDURE  Ask your caregiver if you will have to spend the night at the hospital. If you will not have to spend the night, arrange to have an adult drive you and stay with you for 24 hours.  Follow your caregiver's instructions if you are taking dietary supplements or medicines. Your caregiver may tell you  to stop taking them or to reduce your dosage.  Do not smoke for as long as possible before your procedure. If possible, stop smoking 3-6 weeks before the procedure.  Do not take new dietary supplements or medicines within 1 week of your procedure unless your caregiver approves them.  Do not eat within 8 hours of  your procedure or as directed by your caregiver. Drink only clear liquids, such as water, black coffee (without milk or cream), and fruit juices (without pulp).  Do not drink within 3 hours of your procedure or as directed by your caregiver.  You may brush your teeth on the morning of the procedure, but make sure to spit out the toothpaste and water when finished. PROCEDURE  You will receive anesthetics through a mask, through an intravenous (IV) access tube, or through both. A doctor who specializes in anesthesia (anesthesiologist) or a nurse who specializes in anesthesia (nurse anesthetist) or both will stay with you throughout the procedure to make sure you remain unconscious. He or she will also watch your blood pressure, pulse, and oxygen levels to make sure that the anesthetics do not cause any problems. Once you are asleep, a breathing tube or mask may be used to help you breathe. AFTER THE PROCEDURE You will wake up after the procedure is complete. You may be in the room where the procedure was performed or in a recovery area. You may have a sore throat if a breathing tube was used. You may also feel:  Dizzy.  Weak.  Drowsy.  Confused.  Nauseous.  Cold. These are all normal responses and can be expected to last for up to 24 hours after the procedure is complete. A caregiver will tell you when you are ready to go home. This will usually be when you are fully awake and in stable condition.   This information is not intended to replace advice given to you by your health care provider. Make sure you discuss any questions you have with your health care provider.   Document Released: 10/26/2007 Document Revised: 08/09/2014 Document Reviewed: 11/17/2011 Elsevier Interactive Patient Education 2016 [employer]. Esophagogastroduodenoscopy Esophagogastroduodenoscopy (EGD) is a procedure that is used to examine the lining of the esophagus, stomach, and first part of the small intestine  (duodenum). A long, flexible, lighted tube with a camera attached (endoscope) is inserted down the throat to view these organs. This procedure is done to detect problems or abnormalities, such as inflammation, bleeding, ulcers, or growths, in order to treat them. The procedure lasts 5-20 minutes. It is usually an outpatient procedure, but it may need to be performed in a hospital in emergency cases. LET [facility] CARE PROVIDER KNOW ABOUT:  Any allergies you have.  All medicines you are taking, including vitamins, herbs, eye drops, creams, and over-the-counter medicines.  Previous problems you or members of your family have had with the use of anesthetics.  Any blood disorders you have.  Previous surgeries you have had.  Medical conditions you have. RISKS AND COMPLICATIONS Generally, this is a safe procedure. However, problems can occur and include:  Infection.  Bleeding.  Tearing (perforation) of the esophagus, stomach, or duodenum.  Difficulty breathing or not being able to breathe.  Excessive sweating.  Spasms of the larynx.  Slowed heartbeat.  Low blood pressure. BEFORE THE PROCEDURE  Do not eat or drink anything after midnight on the night before the procedure or as directed by your health care provider.  Do not take your regular medicines  before the procedure if your health care provider asks you not to. Ask your health care provider about changing or stopping those medicines.  If you wear dentures, be prepared to remove them before the procedure.  Arrange for someone to drive you home after the procedure. PROCEDURE  A numbing medicine (local anesthetic) may be sprayed in your throat for comfort and to stop you from gagging or coughing.  You will have an IV tube inserted in a vein in your hand or arm. You will receive medicines and fluids through this tube.  You will be given a medicine to relax you (sedative).  A pain reliever will be given through the IV  tube.  A mouth guard may be placed in your mouth to protect your teeth and to keep you from biting on the endoscope.  You will be asked to lie on your left side.  The endoscope will be inserted down your throat and into your esophagus, stomach, and duodenum.  Air will be put through the endoscope to allow your health care provider to clearly view the lining of your esophagus.  The lining of your esophagus, stomach, and duodenum will be examined. During the exam, your health care provider may:  Remove tissue to be examined under a microscope (biopsy) for inflammation, infection, or other medical problems.  Remove growths.  Remove objects (foreign bodies) that are stuck.  Treat any bleeding with medicines or other devices that stop tissues from bleeding (hot cautery, clipping devices).  Widen (dilate) or stretch narrowed areas of your esophagus and stomach.  The endoscope will be withdrawn. AFTER THE PROCEDURE  You will be taken to a recovery area for observation. Your blood pressure, heart rate, breathing rate, and blood oxygen level will be monitored often until the medicines you were given have worn off.  Do not eat or drink anything until the numbing medicine has worn off and your gag reflex has returned. You may choke.  Your health care provider should be able to discuss his or her findings with you. It will take longer to discuss the test results if any biopsies were taken.   This information is not intended to replace advice given to you by your health care provider. Make sure you discuss any questions you have with your health care provider.   Document Released: 11/19/2004 Document Revised: 08/09/2014 Document Reviewed: 06/21/2012 Elsevier Interactive Patient Education [employer].

## 2016-02-02 DIAGNOSIS — J45909 Unspecified asthma, uncomplicated: Secondary | ICD-10-CM | POA: Diagnosis not present

## 2016-02-02 DIAGNOSIS — K5900 Constipation, unspecified: Secondary | ICD-10-CM | POA: Diagnosis not present

## 2016-02-02 DIAGNOSIS — B182 Chronic viral hepatitis C: Secondary | ICD-10-CM | POA: Diagnosis not present

## 2016-02-02 DIAGNOSIS — J4520 Mild intermittent asthma, uncomplicated: Secondary | ICD-10-CM | POA: Diagnosis not present

## 2016-02-02 DIAGNOSIS — K7460 Unspecified cirrhosis of liver: Secondary | ICD-10-CM | POA: Diagnosis not present

## 2016-02-02 LAB — CBC WITH DIFFERENTIAL/PLATELET
Basophils Absolute: 52 cells/uL (ref 0–200)
Basophils Relative: 1 %
Eosinophils Absolute: 104 cells/uL (ref 15–500)
Eosinophils Relative: 2 %
HCT: 33.8 % — ABNORMAL LOW (ref 35.0–45.0)
Hemoglobin: 11.6 g/dL — ABNORMAL LOW (ref 11.7–15.5)
Lymphocytes Relative: 31 %
Lymphs Abs: 1612 cells/uL (ref 850–3900)
MCH: 30.9 pg (ref 27.0–33.0)
MCHC: 34.3 g/dL (ref 32.0–36.0)
MCV: 90.1 fL (ref 80.0–100.0)
MPV: 10.6 fL (ref 7.5–12.5)
Monocytes Absolute: 468 cells/uL (ref 200–950)
Monocytes Relative: 9 %
Neutro Abs: 2964 cells/uL (ref 1500–7800)
Neutrophils Relative %: 57 %
Platelets: 203 10*3/uL (ref 140–400)
RBC: 3.75 MIL/uL — ABNORMAL LOW (ref 3.80–5.10)
RDW: 14.1 % (ref 11.0–15.0)
WBC: 5.2 10*3/uL (ref 3.8–10.8)

## 2016-02-02 LAB — COMPREHENSIVE METABOLIC PANEL
ALT: 8 U/L (ref 6–29)
AST: 12 U/L (ref 10–35)
Albumin: 4.3 g/dL (ref 3.6–5.1)
Alkaline Phosphatase: 59 U/L (ref 33–130)
BUN: 7 mg/dL (ref 7–25)
CO2: 18 mmol/L — ABNORMAL LOW (ref 20–31)
Calcium: 9.6 mg/dL (ref 8.6–10.4)
Chloride: 109 mmol/L (ref 98–110)
Creat: 0.65 mg/dL (ref 0.50–0.99)
Glucose, Bld: 101 mg/dL — ABNORMAL HIGH (ref 65–99)
Potassium: 3.6 mmol/L (ref 3.5–5.3)
Sodium: 136 mmol/L (ref 135–146)
Total Bilirubin: 0.7 mg/dL (ref 0.2–1.2)
Total Protein: 6.8 g/dL (ref 6.1–8.1)

## 2016-02-02 NOTE — Progress Notes (Signed)
Quick Note:  [facility] lab called with abnormal results for CBC with Diff and CMP. Routing to [facility] to review ______

## 2016-02-02 NOTE — Progress Notes (Signed)
Quick Note:  Noted. May await Eric's return. No urgent need to address. ______

## 2016-02-03 LAB — PROTIME-INR
INR: 1
Prothrombin Time: 10.6 s (ref 9.0–11.5)

## 2016-02-03 LAB — AFP TUMOR MARKER: AFP-Tumor Marker: 2.4 ng/mL (ref <6.1)

## 2016-02-04 ENCOUNTER — Encounter (HOSPITAL_COMMUNITY)
Admission: RE | Admit: 2016-02-04 | Discharge: 2016-02-04 | Disposition: A | Discharge status: Home / Self Care | Payer: Commercial Managed Care - HMO | Source: Ambulatory Visit | Attending: Internal Medicine | Admitting: Internal Medicine

## 2016-02-04 ENCOUNTER — Encounter (HOSPITAL_COMMUNITY): Payer: Self-pay

## 2016-02-04 DIAGNOSIS — Z01812 Encounter for preprocedural laboratory examination: Secondary | ICD-10-CM | POA: Insufficient documentation

## 2016-02-09 ENCOUNTER — Ambulatory Visit (HOSPITAL_COMMUNITY)
Admission: RE | Admit: 2016-02-09 | Discharge: 2016-02-09 | Disposition: A | Discharge status: Home / Self Care | Payer: Commercial Managed Care - HMO | Source: Ambulatory Visit | Attending: Internal Medicine | Admitting: Internal Medicine

## 2016-02-09 ENCOUNTER — Encounter (HOSPITAL_COMMUNITY): Payer: Self-pay | Admitting: *Deleted

## 2016-02-09 ENCOUNTER — Encounter (HOSPITAL_COMMUNITY)
Admission: RE | Disposition: A | Discharge status: Home / Self Care | Payer: Self-pay | Source: Ambulatory Visit | Attending: Internal Medicine

## 2016-02-09 ENCOUNTER — Ambulatory Visit (HOSPITAL_COMMUNITY): Payer: Commercial Managed Care - HMO | Admitting: Anesthesiology

## 2016-02-09 DIAGNOSIS — K449 Diaphragmatic hernia without obstruction or gangrene: Secondary | ICD-10-CM | POA: Insufficient documentation

## 2016-02-09 DIAGNOSIS — Z7982 Long term (current) use of aspirin: Secondary | ICD-10-CM | POA: Insufficient documentation

## 2016-02-09 DIAGNOSIS — Z13810 Encounter for screening for upper gastrointestinal disorder: Secondary | ICD-10-CM | POA: Diagnosis not present

## 2016-02-09 DIAGNOSIS — K766 Portal hypertension: Secondary | ICD-10-CM | POA: Insufficient documentation

## 2016-02-09 DIAGNOSIS — M549 Dorsalgia, unspecified: Secondary | ICD-10-CM | POA: Diagnosis not present

## 2016-02-09 DIAGNOSIS — J45901 Unspecified asthma with (acute) exacerbation: Secondary | ICD-10-CM | POA: Diagnosis not present

## 2016-02-09 DIAGNOSIS — K7460 Unspecified cirrhosis of liver: Secondary | ICD-10-CM | POA: Diagnosis not present

## 2016-02-09 DIAGNOSIS — G8929 Other chronic pain: Secondary | ICD-10-CM | POA: Insufficient documentation

## 2016-02-09 DIAGNOSIS — K219 Gastro-esophageal reflux disease without esophagitis: Secondary | ICD-10-CM | POA: Diagnosis not present

## 2016-02-09 DIAGNOSIS — Z86010 Personal history of colonic polyps: Secondary | ICD-10-CM | POA: Insufficient documentation

## 2016-02-09 DIAGNOSIS — F17210 Nicotine dependence, cigarettes, uncomplicated: Secondary | ICD-10-CM | POA: Diagnosis not present

## 2016-02-09 DIAGNOSIS — M5432 Sciatica, left side: Secondary | ICD-10-CM | POA: Insufficient documentation

## 2016-02-09 DIAGNOSIS — B1920 Unspecified viral hepatitis C without hepatic coma: Secondary | ICD-10-CM | POA: Diagnosis not present

## 2016-02-09 DIAGNOSIS — Z8619 Personal history of other infectious and parasitic diseases: Secondary | ICD-10-CM | POA: Diagnosis not present

## 2016-02-09 DIAGNOSIS — Z96652 Presence of left artificial knee joint: Secondary | ICD-10-CM | POA: Insufficient documentation

## 2016-02-09 DIAGNOSIS — K7030 Alcoholic cirrhosis of liver without ascites: Secondary | ICD-10-CM | POA: Insufficient documentation

## 2016-02-09 DIAGNOSIS — I10 Essential (primary) hypertension: Secondary | ICD-10-CM | POA: Diagnosis not present

## 2016-02-09 DIAGNOSIS — M797 Fibromyalgia: Secondary | ICD-10-CM | POA: Insufficient documentation

## 2016-02-09 DIAGNOSIS — Z79899 Other long term (current) drug therapy: Secondary | ICD-10-CM | POA: Diagnosis not present

## 2016-02-09 DIAGNOSIS — K5900 Constipation, unspecified: Secondary | ICD-10-CM | POA: Diagnosis not present

## 2016-02-09 DIAGNOSIS — Z800 Family history of malignant neoplasm of digestive organs: Secondary | ICD-10-CM | POA: Insufficient documentation

## 2016-02-09 HISTORY — PX: ESOPHAGOGASTRODUODENOSCOPY (EGD) WITH PROPOFOL: SHX5813

## 2016-02-09 SURGERY — ESOPHAGOGASTRODUODENOSCOPY (EGD) WITH PROPOFOL
Anesthesia: Monitor Anesthesia Care

## 2016-02-09 MED ORDER — MIDAZOLAM HCL 2 MG/2ML IJ SOLN
1.0000 mg | INTRAMUSCULAR | Status: DC | PRN
  Administered 2016-02-09: 2 mg via INTRAVENOUS

## 2016-02-09 MED ORDER — MIDAZOLAM HCL 2 MG/2ML IJ SOLN
INTRAMUSCULAR | Status: AC
  Filled 2016-02-09: qty 2

## 2016-02-09 MED ORDER — LIDOCAINE VISCOUS 2 % MT SOLN
4.0000 mL | Freq: Once | OROMUCOSAL | Status: AC
  Administered 2016-02-09: 4 mL via OROMUCOSAL

## 2016-02-09 MED ORDER — EPINEPHRINE HCL 0.1 MG/ML IJ SOSY
PREFILLED_SYRINGE | INTRAMUSCULAR | Status: AC
  Filled 2016-02-09: qty 10

## 2016-02-09 MED ORDER — FENTANYL CITRATE (PF) 100 MCG/2ML IJ SOLN: 25.0000 ug | INTRAMUSCULAR | Status: DC | PRN

## 2016-02-09 MED ORDER — LIDOCAINE HCL (PF) 1 % IJ SOLN
INTRAMUSCULAR | Status: AC
  Filled 2016-02-09: qty 5

## 2016-02-09 MED ORDER — SIMETHICONE 40 MG/0.6ML PO SUSP
ORAL | Status: AC
  Filled 2016-02-09: qty 1.2

## 2016-02-09 MED ORDER — FENTANYL CITRATE (PF) 100 MCG/2ML IJ SOLN
25.0000 ug | INTRAMUSCULAR | Status: DC | PRN
  Administered 2016-02-09: 25 ug via INTRAVENOUS

## 2016-02-09 MED ORDER — LIDOCAINE HCL (CARDIAC) 10 MG/ML IV SOLN
INTRAVENOUS | Status: DC | PRN
  Administered 2016-02-09: 50 mg via INTRAVENOUS

## 2016-02-09 MED ORDER — MIDAZOLAM HCL 5 MG/5ML IJ SOLN
INTRAMUSCULAR | Status: DC | PRN
  Administered 2016-02-09: 2 mg via INTRAVENOUS

## 2016-02-09 MED ORDER — FENTANYL CITRATE (PF) 100 MCG/2ML IJ SOLN
INTRAMUSCULAR | Status: AC
  Filled 2016-02-09: qty 2

## 2016-02-09 MED ORDER — MINERAL OIL PO OIL
TOPICAL_OIL | ORAL | Status: AC
Start: 2016-02-09 — End: 2016-02-09
  Filled 2016-02-09: qty 30

## 2016-02-09 MED ORDER — LIDOCAINE VISCOUS 2 % MT SOLN
OROMUCOSAL | Status: AC
  Filled 2016-02-09: qty 15

## 2016-02-09 MED ORDER — PROPOFOL 10 MG/ML IV BOLUS
INTRAVENOUS | Status: DC | PRN
  Administered 2016-02-09: 20 mg via INTRAVENOUS
  Administered 2016-02-09: 10 mg via INTRAVENOUS

## 2016-02-09 MED ORDER — ONDANSETRON HCL 4 MG/2ML IJ SOLN: 4.0000 mg | Freq: Once | INTRAMUSCULAR | Status: DC | PRN

## 2016-02-09 MED ORDER — LACTATED RINGERS IV SOLN
INTRAVENOUS | Status: DC
  Administered 2016-02-09: 08:00:00 via INTRAVENOUS

## 2016-02-09 MED ORDER — PROPOFOL 500 MG/50ML IV EMUL
INTRAVENOUS | Status: DC | PRN
  Administered 2016-02-09: 75 ug/kg/min via INTRAVENOUS

## 2016-02-09 MED ORDER — ONDANSETRON HCL 4 MG/2ML IJ SOLN
4.0000 mg | Freq: Once | INTRAMUSCULAR | Status: AC
  Administered 2016-02-09: 4 mg via INTRAVENOUS

## 2016-02-09 MED ORDER — ONDANSETRON HCL 4 MG/2ML IJ SOLN
INTRAMUSCULAR | Status: AC
  Filled 2016-02-09: qty 2

## 2016-02-09 MED ORDER — PROPOFOL 10 MG/ML IV BOLUS
INTRAVENOUS | Status: AC
  Filled 2016-02-09: qty 40

## 2016-02-09 NOTE — Transfer of Care (Signed)
Immediate Anesthesia Transfer of Care Note  Patient: Colleen Lee  Procedure(s) Performed: Procedure(s) with comments: ESOPHAGOGASTRODUODENOSCOPY (EGD) WITH PROPOFOL (N/A) - 730   Patient Location: PACU  Anesthesia Type:MAC  Level of Consciousness: awake  Airway & Oxygen Therapy: Patient Spontanous Breathing and Patient connected to nasal cannula oxygen  Post-op Assessment: Report given to RN, Post -op Vital signs reviewed and stable and Patient moving all extremities  Post vital signs: Reviewed and stable   Last Pain:  Filed Vitals:   02/09/16 0755  PainSc: 7       Patients Stated Pain Goal: 7 ([id number])  Complications: No apparent anesthesia complications

## 2016-02-09 NOTE — Anesthesia Preprocedure Evaluation (Signed)
Anesthesia Evaluation  Patient identified by MRN, date of birth, ID band Patient awake    Reviewed: Allergy & Precautions, H&P , NPO status , [name] Chart, lab work & pertinent test results  History of Anesthesia Complications (+) PONV and history of anesthetic complications  Airway Mallampati: II  TM Distance: >3 FB     Dental  (+) Poor Dentition, Missing   Pulmonary asthma , COPD, Current Smoker,    breath sounds clear to auscultation       Cardiovascular hypertension, Pt. on medications  Rhythm:Regular Rate:Normal     Neuro/Psych  Neuromuscular disease    GI/Hepatic GERD  Medicated and Controlled,(+) Cirrhosis     substance abuse (remission 71yrs )  alcohol use, Hepatitis -, C  Endo/Other    Renal/GU      Musculoskeletal  (+) Fibromyalgia -  Abdominal   Peds  Hematology   Anesthesia Other Findings Infection L eyelid  Reproductive/Obstetrics                             Anesthesia Physical Anesthesia Plan  ASA: III  Anesthesia Plan: MAC   Post-op Pain Management:    Induction: Intravenous  Airway Management Planned: Simple Face Mask  Additional Equipment:   Intra-op Plan:   Post-operative Plan:   Informed Consent: I have reviewed the patients History and Physical, chart, labs and discussed the procedure including the risks, benefits and alternatives for the proposed anesthesia with the patient or authorized representative who has indicated his/her understanding and acceptance.     Plan Discussed with:   Anesthesia Plan Comments:         Anesthesia Quick Evaluation

## 2016-02-09 NOTE — Anesthesia Procedure Notes (Signed)
Procedure Name: MAC Date/Time: 02/09/2016 7:50 AM Performed by: Vista Deck Pre-anesthesia Checklist: Patient identified, Emergency Drugs available, Suction available, Timeout performed and Patient being monitored Patient Re-evaluated:Patient Re-evaluated prior to inductionOxygen Delivery Method: Non-rebreather mask

## 2016-02-09 NOTE — H&P (View-Only) (Signed)
Referring Provider: Rosita Fire, MD Primary Care Physician:  Rosita Fire, MD Primary GI:  Dr. Gala Romney  Chief Complaint  Patient presents with  . Follow-up    HPI:   Colleen Lee is a 60 y.o. female who presents for follow-up on cirrhosis. Strip [name] infection documented SVR on 09/22/2015. She was last seen in our office 10/22/2015 for the same. She was due for variceal screening in February 2017. At that time she noted she had to postpone her upper endoscopy for variceal screening due to "getting a cold." Was still having breathing issues at that time with shortness of breath walking short distances, still on inhalers and other medications from the emergency department. She was generally asymptomatic from a GI standpoint. It was decided to further hold off on EGD at that time due to resolving upper respiratory infection with asthma exacerbation. Recommended she see her primary care as soon as possible, follow-up in 3 months to reevaluate and schedule upper endoscopy. Of note she was seen in the emergency department twice in the past month for nausea/vomiting, diagnosed with colitis and/or gastroenteritis and started on Cipro/Flagyl.  Last abdominal imaging completed 11/24/2015 while in the emergency department with no noted liver lesions. Last PT/INR completed 09/22/2015 which is normal, CMP most recently completed 11/24/2015 with normal/low LFTs, total bili 0.9, albumin 4.6. She will next be due for liver labs at the end of August.  Today she states she's doing ok overall. Has is having worsening constipation, has a history of this. Thinks her medications are "stopping me up." Will have the urge to have a bowel movement but unable to go unless she takes an OTC laxatives or Epsom salt. Is on chronic pain medications. Denies hematochezia, melena. Does have some crampy abdomianl pain and some nausea associated with her constipation. Pain and nausea resolves after a bowel movement. Denies  yellowing of skin/eyes, darkened urine, acute episodic confusion, hematemesis, tremors. Denies chest pain, dyspnea, dizziness, lightheadedness, syncope, near syncope. Denies any other upper or lower GI symptoms.  Feels breathing is "a whole lot better" now and is comfortable with having due EGD for variceal screening.  Past Medical History  Diagnosis Date  . Hypertension   . Fibroids   . [name]     HCV RNA positive 09/2012  . GERD (gastroesophageal reflux disease)   . H. pylori infection 2014    treated with pylera, had to be treated again as it was not eradicated. Urea breath test then negative after subsequent treatment.   . Asthma   . PONV (postoperative nausea and vomiting)     pt thinks maybe once she had N&V  . Cirrhosis ([place])     Metavir score F4 on elastography 2015  . Fibromyalgia   . Chronic back pain   . Sciatica of left side     Past Surgical History  Procedure Laterality Date  . Knee surgery      left knee  . Colonoscopy  01/18/2008    ERD:EYCXKG rectum.  Long redundant colon, a diminutive sigmoid polyp status post cold biopsy removed. Hyperplastic polyp. Repeat colonoscopy June 2014 due to family history of colon cancer  . Esophagogastroduodenoscopy  01/18/2008    RMR: Normal esophagus, normal  stomach  . Colonoscopy with esophagogastroduodenoscopy (egd) N/A 11/02/2012    YJE:HUDJSHFW gastric mucosa of doubtful, +H.pylori. Incomplete colonoscopy due to patient unable to tolerate exam, proximal colon seen. Patient refused ACBE.  . Colonoscopy with propofol N/A 03/08/2013    Dr.  Rourk: colonic polyp-removed as scribed above. Internal Hemorrhoids. Pathology did not reveal any colonic tissue, only mucus. SURVEILLANCE DUE Aug 2019  . Polypectomy N/A 03/08/2013    Procedure: POLYPECTOMY;  Surgeon: Daneil Dolin, MD;  Location: [facility];  Service: Endoscopy;  Laterality: N/A;  . Multiple extractions with alveoloplasty N/A 10/15/2013    Procedure: MULTIPLE EXTRACTION WITH  ALVEOLOPLASTY;  Surgeon: Gae Bon, DDS;  Location: [facility];  Service: Oral Surgery;  Laterality: N/A;  . Total knee arthroplasty Left 02/05/2015    Procedure: LEFT TOTAL KNEE ARTHROPLASTY;  Surgeon: Carole Civil, MD;  Location: [facility];  Service: Orthopedics;  Laterality: Left;    Current Outpatient Prescriptions  Medication Sig Dispense Refill  . amLODipine (NORVASC) 10 MG tablet Take 1 tablet by mouth daily. Take along with 5 mg for a total of 15 mg.    . aspirin EC 325 MG EC tablet Take 1 tablet (325 mg total) by mouth 2 (two) times daily. 60 tablet 0  . benzonatate (TESSALON) 100 MG capsule Take 1 capsule (100 mg total) by mouth every 8 (eight) hours. 21 capsule 0  . budesonide-formoterol (SYMBICORT) 160-4.5 MCG/ACT inhaler Inhale 2 puffs into the lungs 2 (two) times daily.    . ciprofloxacin (CIPRO) 500 MG tablet Take 1 tablet (500 mg total) by mouth 2 (two) times daily. One po bid x 7 days 29 tablet 0  . Cyanocobalamin (VITAMIN B 12 PO) Take 1 tablet by mouth daily.    . cyclobenzaprine (FLEXERIL) 10 MG tablet Take 1 tablet (10 mg total) by mouth 3 (three) times daily. 90 tablet 5  . esomeprazole (NEXIUM) 40 MG capsule Take 1 capsule (40 mg total) by mouth every morning. 10 capsule 0  . HYDROcodone-acetaminophen (NORCO) 7.5-325 MG tablet Take 1 tablet by mouth every 8 (eight) hours as needed for moderate pain. 90 tablet 0  . lisinopril (PRINIVIL,ZESTRIL) 20 MG tablet Take 1 tablet by mouth daily.    . megestrol (MEGACE) 40 MG tablet Take 40 mg by mouth daily.    . metroNIDAZOLE (FLAGYL) 500 MG tablet One po qid 40 tablet 0  . ondansetron (ZOFRAN ODT) 4 MG disintegrating tablet '4mg'$  ODT q4 hours prn nausea/vomit 12 tablet 0  . oxyCODONE-acetaminophen (PERCOCET/ROXICET) 5-325 MG tablet Take 1 tablet by mouth every 6 (six) hours as needed. 20 tablet 0  . SPIRIVA HANDIHALER 18 MCG inhalation capsule Place 18 mcg into inhaler and inhale daily.    Marland Kitchen tiZANidine (ZANAFLEX) 2 MG tablet Take  1 tablet (2 mg total) by mouth every 8 (eight) hours as needed. (Patient taking differently: Take 2 mg by mouth every 8 (eight) hours as needed for muscle spasms. ) 60 tablet 0   No current facility-administered medications for this visit.    Allergies as of 01/21/2016 - Review Complete 01/21/2016  Allergen Reaction Noted  . Penicillins Rash     Family History  Problem Relation Age of Onset  . Colon cancer Brother 97       . Asthma      FH  . Multiple myeloma Brother   . Liver cancer Sister   . Prostate cancer Brother   . Pancreatic cancer Brother   . Cancer Other     niece, age 55, primary unknown    Social History   Social History  . Marital Status: Married    Spouse Name: N/A  . Number of Children: 0  . Years of Education: N/A   Occupational History  .  umemployed     Social History Main Topics  . Smoking status: Current Every Day Smoker -- 1.00 packs/day for 40 years    Types: Cigarettes  . Smokeless tobacco: Never Used     Comment: Smokes one pack of cigarettes daily  . Alcohol Use: No     Comment: former, last Jan 2014  . Drug Use: No     Comment: history of marijuana in past  . Sexual Activity: No   Other Topics Concern  . None   Social History Narrative    Review of Systems: General: Negative for anorexia, weight loss, fever, chills, fatigue, weakness. ENT: Negative for hoarseness, difficulty swallowing. CV: Negative for chest pain, angina, palpitations, peripheral edema.  Respiratory: Negative for dyspnea at rest, cough, sputum, wheezing.  GI: See history of present illness. Neuro: Negative for memory loss, confusion.  Endo: Negative for unusual weight change.  Heme: Negative for bruising or bleeding.   Physical Exam: BP 116/74 mmHg  Pulse 72  Temp(Src) 99.3 F (37.4 C)  Ht '5\' 6"'$  (1.676 m)  Wt 182 lb 3.2 oz (82.645 kg)  BMI 29.42 kg/m2 General:   Alert and oriented. Pleasant and cooperative. Well-nourished and well-developed.  Eyes:   Without icterus, sclera clear and conjunctiva pink.  Ears:  Normal auditory acuity. Cardiovascular:  S1, S2 present without murmurs appreciated. Extremities without clubbing or edema. Respiratory:  Clear to auscultation bilaterally. No wheezes, rales, or rhonchi. No distress.  Gastrointestinal:  +BS, rounded but soft, non-tender and non-distended. No guarding or rebound. Rectal:  Deferred  Musculoskalatal:  Symmetrical without gross deformities. Neurologic:  Alert and oriented x4;  grossly normal neurologically. Psych:  Alert and cooperative. Normal mood and affect. Heme/Lymph/Immune: No excessive bruising noted.    01/21/2016 9:41 AM   Disclaimer: This note was dictated with voice recognition software. Similar sounding words can inadvertently be transcribed and may not be corrected upon review.

## 2016-02-09 NOTE — Anesthesia Postprocedure Evaluation (Signed)
Anesthesia Post Note  Patient: Colleen Lee  Procedure(s) Performed: Procedure(s) (LRB): ESOPHAGOGASTRODUODENOSCOPY (EGD) WITH PROPOFOL (N/A)  Patient location during evaluation: PACU Anesthesia Type: MAC Level of consciousness: awake Pain management: satisfactory to patient Vital Signs Assessment: post-procedure vital signs reviewed and stable Respiratory status: spontaneous breathing and patient connected to nasal cannula oxygen Cardiovascular status: stable Anesthetic complications: no    Last Vitals:  Filed Vitals:   02/09/16 0646  BP: 119/75  Pulse: 65  Temp: 37.1 C  Resp: 16    Last Pain:  Filed Vitals:   02/09/16 0755  PainSc: 7                  Alverta Caccamo

## 2016-02-09 NOTE — Interval H&P Note (Signed)
History and Physical Interval Note:  02/09/2016 7:25 AM  Colleen Lee  has presented today for surgery, with the diagnosis of HEP C/CIRRHOSIS  The various methods of treatment have been discussed with the patient and family. After consideration of risks, benefits and other options for treatment, the patient has consented to  Procedure(s) with comments: ESOPHAGOGASTRODUODENOSCOPY (EGD) WITH PROPOFOL (N/A) - 730  as a surgical intervention .  The patient's history has been reviewed, patient examined, no change in status, stable for surgery.  I have reviewed the patient's chart and labs.  Questions were answered to the patient's satisfaction.     Colleen Lee  No change. Screening EGD per plan.  The risks, benefits, limitations, alternatives and imponderables have been reviewed with the patient. Potential for esophageal dilation, biopsy, etc. have also been reviewed.  Questions have been answered. All parties agreeable.

## 2016-02-09 NOTE — Discharge Instructions (Signed)
EGD Discharge instructions Please read the instructions outlined below and refer to this sheet in the next few weeks. These discharge instructions provide you with general information on caring for yourself after you leave the hospital. Your doctor may also give you specific instructions. While your treatment has been planned according to the most current medical practices available, unavoidable complications occasionally occur. If you have any problems or questions after discharge, please call your doctor. ACTIVITY  You may resume your regular activity but move at a slower pace for the next 24 hours.   Take frequent rest periods for the next 24 hours.   Walking will help expel (get rid of) the air and reduce the bloated feeling in your abdomen.   No driving for 24 hours (because of the anesthesia (medicine) used during the test).   You may shower.   Do not sign any important legal documents or operate any machinery for 24 hours (because of the anesthesia used during the test).  NUTRITION  Drink plenty of fluids.   You may resume your normal diet.   Begin with a light meal and progress to your normal diet.   Avoid alcoholic beverages for 24 hours or as instructed by your caregiver.  MEDICATIONS  You may resume your normal medications unless your caregiver tells you otherwise.  WHAT YOU CAN EXPECT TODAY  You may experience abdominal discomfort such as a feeling of fullness or gas pains.  FOLLOW-UP  Your doctor will discuss the results of your test with you.  SEEK IMMEDIATE MEDICAL ATTENTION IF ANY OF THE FOLLOWING OCCUR:  Excessive nausea (feeling sick to your stomach) and/or vomiting.   Severe abdominal pain and distention (swelling).   Trouble swallowing.   Temperature over 101 F (37.8 C).   Rectal bleeding or vomiting of blood.    Office visit with [place] in 4 months  Nov 13th  at 10 am with [facility]  Repeat EGD in 2 years

## 2016-02-09 NOTE — Op Note (Signed)
[facility] Patient Name: Colleen Lee Procedure Date: 02/09/2016 7:27 AM MRN: [id number] Date of Birth: 1956-05-13 Attending MD: Gennette Pac , MD CSN: [id number] Age: 60 Admit Type: Outpatient Procedure:                Upper GI endoscopy Indications:              Screening procedure Providers:                Gennette Pac, MD, Criselda Peaches. Patsy Lager, RN,                            Burke Keels, Technician Referring MD:              Medicines:                Monitored Anesthesia Care Complications:            No immediate complications. Estimated Blood Loss:     Estimated blood loss: none. Procedure:                Pre-Anesthesia Assessment:                           - Prior to the procedure, a History and Physical                            was performed, and patient medications and                            allergies were reviewed. The patient's tolerance of                            previous anesthesia was also reviewed. The risks                            and benefits of the procedure and the sedation                            options and risks were discussed with the patient.                            All questions were answered, and informed consent                            was obtained. Prior Anticoagulants: The patient has                            taken no previous anticoagulant or antiplatelet                            agents. ASA Grade Assessment: III - A patient with                            severe systemic disease. After reviewing the risks  and benefits, the patient was deemed in                            satisfactory condition to undergo the procedure.                           After obtaining informed consent, the endoscope was                            passed under direct vision. Throughout the                            procedure, the patient's blood pressure, pulse, and                            oxygen  saturations were monitored continuously. The                            EG-[id number] ([id number]) scope was introduced through the                            mouth, and advanced to the second part of duodenum.                            The upper GI endoscopy was accomplished without                            difficulty. The patient tolerated the procedure                            well. Scope In: 8:00:49 AM Scope Out: 8:04:39 AM Total Procedure Duration: 0 hours 3 minutes 50 seconds  Findings:      The examined esophagus was normal.      A small hiatal hernia was present.      Mild portal hypertensive gastropathy was found in the stomach. The       remainder of the gastric mucosa appeared normal.      The second portion of the duodenum was normal. Impression:               - Normal esophagus.                           - Small hiatal hernia.                           - Portal hypertensive gastropathy.                           - Normal second portion of the duodenum.                           - No specimens collected. Moderate Sedation:      Moderate (conscious) sedation was personally administered by an       anesthesia professional. The following parameters were monitored: oxygen       saturation, heart rate, blood  pressure, respiratory rate, EKG, adequacy       of pulmonary ventilation, and response to care. Total physician       intraservice time was 11 minutes. Recommendation:           - Patient has a contact number available for                            emergencies. The signs and symptoms of potential                            delayed complications were discussed with the                            patient. Return to normal activities tomorrow.                            Written discharge instructions were provided to the                            patient.                           - Advance diet as tolerated.                           - Continue present medications.                            - Repeat upper endoscopy in 2 years for screening                            purposes.                           - Return to GI office in 3 months. Procedure Code(s):        --- Professional ---                           [phone], Esophagogastroduodenoscopy, flexible,                            transoral; diagnostic, including collection of                            specimen(s) by brushing or washing, when performed                            (separate procedure) Diagnosis Code(s):        --- Professional ---                           K44.9, Diaphragmatic hernia without obstruction or                            gangrene  K76.6, Portal hypertension                           K31.89, Other diseases of stomach and duodenum                           Z13.810, Encounter for screening for upper                            gastrointestinal disorder CPT copyright 2016 American Medical Association. All rights reserved. The codes documented in this report are preliminary and upon coder review may  be revised to meet current compliance requirements. Gerrit Friends. Treanna Dumler, MD Gennette Pac, MD 02/09/2016 8:12:02 AM This report has been signed electronically. Number of Addenda: 0

## 2016-02-12 ENCOUNTER — Telehealth: Payer: Self-pay | Admitting: Internal Medicine

## 2016-02-12 ENCOUNTER — Other Ambulatory Visit: Payer: Self-pay | Admitting: Nurse Practitioner

## 2016-02-12 DIAGNOSIS — D649 Anemia, unspecified: Secondary | ICD-10-CM

## 2016-02-12 NOTE — Telephone Encounter (Signed)
Pt called this morning to say that she had an EGD on Monday 7/10 and on Tuesday she began spotting and didn't know if that was normal or not. Please advise and call her on cell [phone]

## 2016-02-12 NOTE — Telephone Encounter (Signed)
I spoke with the pt, she said for 2 days she has been having light vaginal bleeding. She was wondering if it was because of the EGD that she had recently. I advised her that an EGD should not cause vaginal bleeding and this was something that she should contact her GYN or pcp about. Pt verbalized understanding and said she was going to call her pcp.

## 2016-02-12 NOTE — Progress Notes (Signed)
Quick Note:  Please tell the patient her hgb dropped a little bit, but she has a similar reading earlier in the year. Lets recheck CBC early next week (I'll put the order in) to make sure it's not dropping any more. Please ask if she's seen any bleeding? Call or proceed to the ER if any severe symptoms (chest pain, very short of breath, dizziness, passing out, etc). Otherwise her kidney and liver functions looks good.  MELD: 6 Child-Pugh: A ______

## 2016-02-12 NOTE — Telephone Encounter (Signed)
Communication noted. Agree with the advice given. Her current symptoms are unrelated to recent EGD

## 2016-02-12 NOTE — Telephone Encounter (Signed)
I tried to call pt- NA and voicemail was full and could not accept new messages.

## 2016-02-13 ENCOUNTER — Other Ambulatory Visit: Payer: Self-pay

## 2016-02-13 ENCOUNTER — Encounter (HOSPITAL_COMMUNITY): Payer: Self-pay | Admitting: Internal Medicine

## 2016-02-13 DIAGNOSIS — D649 Anemia, unspecified: Secondary | ICD-10-CM

## 2016-02-18 DIAGNOSIS — F17200 Nicotine dependence, unspecified, uncomplicated: Secondary | ICD-10-CM | POA: Diagnosis not present

## 2016-02-18 DIAGNOSIS — J449 Chronic obstructive pulmonary disease, unspecified: Secondary | ICD-10-CM | POA: Diagnosis not present

## 2016-02-18 DIAGNOSIS — I10 Essential (primary) hypertension: Secondary | ICD-10-CM | POA: Diagnosis not present

## 2016-02-18 DIAGNOSIS — N950 Postmenopausal bleeding: Secondary | ICD-10-CM | POA: Diagnosis not present

## 2016-02-24 ENCOUNTER — Other Ambulatory Visit: Payer: Self-pay | Admitting: Orthopedic Surgery

## 2016-02-24 ENCOUNTER — Ambulatory Visit (INDEPENDENT_AMBULATORY_CARE_PROVIDER_SITE_OTHER): Payer: Commercial Managed Care - HMO | Admitting: Orthopedic Surgery

## 2016-02-24 ENCOUNTER — Encounter: Payer: Self-pay | Admitting: Orthopedic Surgery

## 2016-02-24 VITALS — BP 119/75 | Ht 65.5 in | Wt 176.0 lb

## 2016-02-24 DIAGNOSIS — M5410 Radiculopathy, site unspecified: Secondary | ICD-10-CM | POA: Diagnosis not present

## 2016-02-24 DIAGNOSIS — M1732 Unilateral post-traumatic osteoarthritis, left knee: Secondary | ICD-10-CM | POA: Diagnosis not present

## 2016-02-24 DIAGNOSIS — M5442 Lumbago with sciatica, left side: Secondary | ICD-10-CM

## 2016-02-24 DIAGNOSIS — Z7189 Other specified counseling: Secondary | ICD-10-CM | POA: Diagnosis not present

## 2016-02-24 DIAGNOSIS — S82142P Displaced bicondylar fracture of left tibia, subsequent encounter for closed fracture with malunion: Secondary | ICD-10-CM | POA: Diagnosis not present

## 2016-02-24 DIAGNOSIS — G8929 Other chronic pain: Secondary | ICD-10-CM

## 2016-02-24 MED ORDER — CYCLOBENZAPRINE HCL 10 MG PO TABS: 10.0000 mg | ORAL_TABLET | Freq: Three times a day (TID) | ORAL | 5 refills | Status: DC

## 2016-02-24 MED ORDER — HYDROCODONE-ACETAMINOPHEN 7.5-325 MG PO TABS: 1.0000 | ORAL_TABLET | Freq: Three times a day (TID) | ORAL | 0 refills | Status: DC | PRN

## 2016-02-24 NOTE — Patient Instructions (Signed)
[facility] LAST INJECTION  [phone]

## 2016-02-24 NOTE — Progress Notes (Signed)
Patient ID: Colleen Lee, female   DOB: [birth year], 60 y.o.   MRN: [id number]  Chief Complaint  Patient presents with  . Follow-up    CHRONIC PAIN    HPI monthly follow-up for chronic pain left knee and lower back. Patient status post 2 epidural injections with good result for back pain  Complains of left lateral knee pain as well as lower back pain radiating to left leg  Review of Systems  Constitutional: Negative for chills and fever.  Respiratory: Negative for shortness of breath.   Cardiovascular: Negative for chest pain.      BP 119/75   Ht 5' 5.5" (1.664 m)   Wt 176 lb (79.8 kg)   BMI 28.84 kg/m  Gen. appearance is normal grooming and hygiene Orientation to person place and time normal Mood normal Gait is  abnormal supported by a cane and also limping is noted No peripheral edema or swelling is noted in the left or right ankle Sensory exam shows normal sensation to palpation, pressure and soft touch Skin exam no lacerations ulcerations or erythema  Ortho Exam  Left knee well-healed anterior incision knee flexion 105 knee extension +5 collateral ligaments are stable joint lines are tender A/P  Medical decision-making  Chronic pain Back pain Left leg pain Left knee arthritis  Last urine drug screen was appropriate for current medication Arther Abbott, MD 02/24/2016 11:48 AM

## 2016-02-27 ENCOUNTER — Encounter: Payer: Self-pay | Admitting: Obstetrics and Gynecology

## 2016-02-27 ENCOUNTER — Ambulatory Visit (INDEPENDENT_AMBULATORY_CARE_PROVIDER_SITE_OTHER): Payer: Commercial Managed Care - HMO | Admitting: Obstetrics and Gynecology

## 2016-02-27 VITALS — BP 130/80 | Ht 66.0 in

## 2016-02-27 DIAGNOSIS — N950 Postmenopausal bleeding: Secondary | ICD-10-CM | POA: Diagnosis not present

## 2016-02-27 DIAGNOSIS — R1084 Generalized abdominal pain: Secondary | ICD-10-CM

## 2016-02-27 DIAGNOSIS — F17210 Nicotine dependence, cigarettes, uncomplicated: Secondary | ICD-10-CM

## 2016-02-27 NOTE — Progress Notes (Addendum)
[facility] Visit  02/27/16            Patient name: Colleen Lee MRN [id number]  Date of birth: July 25, 1956  CC & HPI:  Colleen Lee is a 60 y.o. female presenting today for postmenopausal vaginal spotting which began on 02/10/16 and lasted for 1.5 weeks; resolved at this time. She also notes abdominal tenderness but states she is followed by a GI. She denies h/o hysterectomy. Her last pap smear was "awhile" ago. She is smoker; denies alcohol use.   ROS:  ROS +Vaginal bleeding +Abdominal Pain  Otherwise negative   Pertinent History Reviewed:   Reviewed: Significant for Fibroids Medical         Past Medical History:  Diagnosis Date   Asthma    Chronic back pain    Cirrhosis ([place])    Metavir score F4 on elastography 2015   Fibroids    Fibromyalgia    GERD (gastroesophageal reflux disease)    H. pylori infection 2014   treated with pylera, had to be treated again as it was not eradicated. Urea breath test then negative after subsequent treatment.    [name]    HCV RNA positive 09/2012   Hypertension    PONV (postoperative nausea and vomiting)    pt thinks maybe once she had N&V   Sciatica of left side                               Surgical Hx:    Past Surgical History:  Procedure Laterality Date   COLONOSCOPY  01/18/2008   [id number] rectum.  Long redundant colon, a diminutive sigmoid polyp status post cold biopsy removed. Hyperplastic polyp. Repeat colonoscopy June 2014 due to family history of colon cancer   COLONOSCOPY WITH ESOPHAGOGASTRODUODENOSCOPY (EGD) N/A 11/02/2012   [id number] gastric mucosa of doubtful, +H.pylori. Incomplete colonoscopy due to patient unable to tolerate exam, proximal colon seen. Patient refused ACBE.   COLONOSCOPY WITH PROPOFOL N/A 03/08/2013   Dr. Gala Romney: colonic polyp-removed as scribed above. Internal Hemorrhoids. Pathology did not reveal any colonic tissue, only mucus. SURVEILLANCE DUE Aug 2019    ESOPHAGOGASTRODUODENOSCOPY  01/18/2008   RMR: Normal esophagus, normal  stomach   ESOPHAGOGASTRODUODENOSCOPY (EGD) WITH PROPOFOL N/A 02/09/2016   Procedure: ESOPHAGOGASTRODUODENOSCOPY (EGD) WITH PROPOFOL;  Surgeon: Daneil Dolin, MD;  [facility];  Service: Endoscopy;  Laterality: N/A;  730    KNEE SURGERY     left knee   MULTIPLE EXTRACTIONS WITH ALVEOLOPLASTY N/A 10/15/2013   Procedure: MULTIPLE EXTRACTION WITH ALVEOLOPLASTY;  Surgeon: Gae Bon, DDS;  Location: [facility];  Service: Oral Surgery;  Laterality: N/A;   POLYPECTOMY N/A 03/08/2013   Procedure: POLYPECTOMY;  Surgeon: Daneil Dolin, MD;  Location: [facility];  Service: Endoscopy;  Laterality: N/A;   TOTAL KNEE ARTHROPLASTY Left 02/05/2015   Procedure: LEFT TOTAL KNEE ARTHROPLASTY;  Surgeon: Carole Civil, MD;  Location: [facility];  Service: Orthopedics;  Laterality: Left;   Medications: Reviewed & Updated - see associated section                       Current Outpatient Prescriptions:    amLODipine (NORVASC) 10 MG tablet, Take 1 tablet by mouth daily. Take along with 5 mg for a total of 15 mg., Disp: , Rfl:    aspirin EC 325 MG EC tablet, Take 1 tablet (325 mg total)  by mouth 2 (two) times daily., Disp: 60 tablet, Rfl: 0   budesonide-formoterol (SYMBICORT) 160-4.5 MCG/ACT inhaler, Inhale 2 puffs into the lungs 2 (two) times daily., Disp: , Rfl:    Cyanocobalamin (VITAMIN B 12 PO), Take 1 tablet by mouth daily., Disp: , Rfl:    cyclobenzaprine (FLEXERIL) 10 MG tablet, Take 1 tablet (10 mg total) by mouth 3 (three) times daily., Disp: 90 tablet, Rfl: 5   esomeprazole (NEXIUM) 40 MG capsule, Take 1 capsule (40 mg total) by mouth every morning., Disp: 10 capsule, Rfl: 0   HYDROcodone-acetaminophen (NORCO) 7.5-325 MG tablet, Take 1 tablet by mouth every 8 (eight) hours as needed for moderate pain., Disp: 60 tablet, Rfl: 0   lisinopril (PRINIVIL,ZESTRIL) 20 MG tablet, Take 1 tablet by mouth daily., Disp: , Rfl:     megestrol (MEGACE) 40 MG tablet, Take 40 mg by mouth daily., Disp: , Rfl:    metroNIDAZOLE (FLAGYL) 500 MG tablet, One po qid, Disp: 40 tablet, Rfl: 0   ondansetron (ZOFRAN ODT) 4 MG disintegrating tablet, 4mg  ODT q4 hours prn nausea/vomit, Disp: 12 tablet, Rfl: 0   oxyCODONE-acetaminophen (PERCOCET/ROXICET) 5-325 MG tablet, Take 1 tablet by mouth every 6 (six) hours as needed., Disp: 20 tablet, Rfl: 0   SPIRIVA HANDIHALER 18 MCG inhalation capsule, Place 18 mcg into inhaler and inhale daily., Disp: , Rfl:   Social History: Reviewed -  reports that she has been smoking Cigarettes.  She has a 40.00 pack-year smoking history. She has never used smokeless tobacco.  Objective Findings:  Vitals: Blood pressure 130/80, height 5\' 6"  (1.676 m).  Physical Examination: General appearance - alert, well appearing, and in no distress and oriented to person, place, and time Mental status - alert, oriented to person, place, and time, normal mood, behavior, speech, dress, motor activity, and thought processes Abdomen- upper abdominal fullness; no masses   Pelvic exam:  VULVA: normal appearing vulva with no masses, tenderness or lesions,  VAGINA: normal appearing vagina with normal color and discharge, no lesions, CERVIX:  no growth or masses; atrophic, well supported. UTERUS: Uterus firm and iregular probable fibroids ADNEXA:  difficult to feel    Assessment & Plan:   A:  1.Post menopausal bleeding   2. History of [name], chronic cirrhosis P:  1. Schedule non-OB US (transvaginal) 2. Needs Pap   By signing my name below, I, Evelene Croon, attest that this documentation has been prepared under the direction and in the presence of Jonnie Kind, MD . Electronically Signed: Evelene Croon, Scribe. 02/27/2016. 11:23 AM. I personally performed the services described in this documentation, which was SCRIBED in my presence. The recorded information has been reviewed and considered accurate. It has  been edited as necessary during review. Jonnie Kind, MD

## 2016-02-27 NOTE — Progress Notes (Signed)
[facility] Visit  02/27/16            Patient name: Colleen Lee MRN [id number]  Date of birth: August 04, 1955  CC & HPI:  Colleen Lee is a 60 y.o. female presenting today for postmenopausal vaginal spotting which began on 02/10/16 and lasted for 1.5 weeks; resolved at this time. She also notes abdominal tenderness but states she is followed by a GI. She denies h/o hysterectomy. Her last pap smear was "awhile" ago. She is smoker; denies alcohol use.   ROS:  ROS +Vaginal bleeding +Abdominal Pain  Otherwise negative   Pertinent History Reviewed:   Reviewed: Significant for Fibroids Medical         Past Medical History:  Diagnosis Date  . Asthma   . Chronic back pain   . Cirrhosis ([place])    Metavir score F4 on elastography 2015  . Fibroids   . Fibromyalgia   . GERD (gastroesophageal reflux disease)   . H. pylori infection 2014   treated with pylera, had to be treated again as it was not eradicated. Urea breath test then negative after subsequent treatment.   . [name]    HCV RNA positive 09/2012  . Hypertension   . PONV (postoperative nausea and vomiting)    pt thinks maybe once she had N&V  . Sciatica of left side                               Surgical Hx:    Past Surgical History:  Procedure Laterality Date  . COLONOSCOPY  01/18/2008   [id number] rectum.  Long redundant colon, a diminutive sigmoid polyp status post cold biopsy removed. Hyperplastic polyp. Repeat colonoscopy June 2014 due to family history of colon cancer  . COLONOSCOPY WITH ESOPHAGOGASTRODUODENOSCOPY (EGD) N/A 11/02/2012   [id number] gastric mucosa of doubtful, +H.pylori. Incomplete colonoscopy due to patient unable to tolerate exam, proximal colon seen. Patient refused ACBE.  [facility] COLONOSCOPY WITH PROPOFOL N/A 03/08/2013   Dr. Gala Romney: colonic polyp-removed as scribed above. Internal Hemorrhoids. Pathology did not reveal any colonic tissue, only mucus. SURVEILLANCE DUE Aug 2019  .  ESOPHAGOGASTRODUODENOSCOPY  01/18/2008   RMR: Normal esophagus, normal  stomach  . ESOPHAGOGASTRODUODENOSCOPY (EGD) WITH PROPOFOL N/A 02/09/2016   Procedure: ESOPHAGOGASTRODUODENOSCOPY (EGD) WITH PROPOFOL;  Surgeon: Daneil Dolin, MD;  [facility];  Service: Endoscopy;  Laterality: N/A;  730   . KNEE SURGERY     left knee  . MULTIPLE EXTRACTIONS WITH ALVEOLOPLASTY N/A 10/15/2013   Procedure: MULTIPLE EXTRACTION WITH ALVEOLOPLASTY;  Surgeon: Gae Bon, DDS;  Location: [facility];  Service: Oral Surgery;  Laterality: N/A;  . POLYPECTOMY N/A 03/08/2013   Procedure: POLYPECTOMY;  Surgeon: Daneil Dolin, MD;  [facility];  Service: Endoscopy;  Laterality: N/A;  . TOTAL KNEE ARTHROPLASTY Left 02/05/2015   Procedure: LEFT TOTAL KNEE ARTHROPLASTY;  Surgeon: Carole Civil, MD;  [facility];  Service: Orthopedics;  Laterality: Left;   Medications: Reviewed & Updated - see associated section                       Current Outpatient Prescriptions:  .  amLODipine (NORVASC) 10 MG tablet, Take 1 tablet by mouth daily. Take along with 5 mg for a total of 15 mg., Disp: , Rfl:  .  aspirin EC 325 MG EC tablet, Take 1 tablet (325 mg total)  by mouth 2 (two) times daily., Disp: 60 tablet, Rfl: 0 .  budesonide-formoterol (SYMBICORT) 160-4.5 MCG/ACT inhaler, Inhale 2 puffs into the lungs 2 (two) times daily., Disp: , Rfl:  .  Cyanocobalamin (VITAMIN B 12 PO), Take 1 tablet by mouth daily., Disp: , Rfl:  .  cyclobenzaprine (FLEXERIL) 10 MG tablet, Take 1 tablet (10 mg total) by mouth 3 (three) times daily., Disp: 90 tablet, Rfl: 5 .  esomeprazole (NEXIUM) 40 MG capsule, Take 1 capsule (40 mg total) by mouth every morning., Disp: 10 capsule, Rfl: 0 .  HYDROcodone-acetaminophen (NORCO) 7.5-325 MG tablet, Take 1 tablet by mouth every 8 (eight) hours as needed for moderate pain., Disp: 60 tablet, Rfl: 0 .  lisinopril (PRINIVIL,ZESTRIL) 20 MG tablet, Take 1 tablet by mouth daily., Disp: , Rfl:  .   megestrol (MEGACE) 40 MG tablet, Take 40 mg by mouth daily., Disp: , Rfl:  .  metroNIDAZOLE (FLAGYL) 500 MG tablet, One po qid, Disp: 40 tablet, Rfl: 0 .  ondansetron (ZOFRAN ODT) 4 MG disintegrating tablet, 4mg  ODT q4 hours prn nausea/vomit, Disp: 12 tablet, Rfl: 0 .  oxyCODONE-acetaminophen (PERCOCET/ROXICET) 5-325 MG tablet, Take 1 tablet by mouth every 6 (six) hours as needed., Disp: 20 tablet, Rfl: 0 .  SPIRIVA HANDIHALER 18 MCG inhalation capsule, Place 18 mcg into inhaler and inhale daily., Disp: , Rfl:   Social History: Reviewed -  reports that she has been smoking Cigarettes.  She has a 40.00 pack-year smoking history. She has never used smokeless tobacco.  Objective Findings:  Vitals: Blood pressure 130/80, height 5\' 6"  (1.676 m).  Physical Examination: General appearance - alert, well appearing, and in no distress and oriented to person, place, and time Mental status - alert, oriented to person, place, and time, normal mood, behavior, speech, dress, motor activity, and thought processes Abdomen- upper abdominal fullness; no masses   Pelvic exam:  VULVA: normal appearing vulva with no masses, tenderness or lesions,  VAGINA: normal appearing vagina with normal color and discharge, no lesions, CERVIX:  no growth or masses; atrophic, well supported. UTERUS: Uterus firm and iregular probable fibroids ADNEXA:  difficult to feel    Assessment & Plan:   A:  1.Post menopausal bleeding   2. History of [name], chronic cirrhosis P:  1. Schedule non-OB US (transvaginal) 2. Needs Pap   By signing my name below, I, Evelene Croon, attest that this documentation has been prepared under the direction and in the presence of Jonnie Kind, MD . Electronically Signed: Evelene Croon, Scribe. 02/27/2016. 11:23 AM. I personally performed the services described in this documentation, which was SCRIBED in my presence. The recorded information has been reviewed and considered accurate. It has  been edited as necessary during review. Jonnie Kind, MD

## 2016-03-02 ENCOUNTER — Ambulatory Visit
Admission: RE | Admit: 2016-03-02 | Discharge: 2016-03-02 | Disposition: A | Discharge status: Home / Self Care | Payer: Commercial Managed Care - HMO | Source: Ambulatory Visit | Attending: Orthopedic Surgery | Admitting: Orthopedic Surgery

## 2016-03-02 DIAGNOSIS — M5442 Lumbago with sciatica, left side: Secondary | ICD-10-CM

## 2016-03-02 DIAGNOSIS — M47817 Spondylosis without myelopathy or radiculopathy, lumbosacral region: Secondary | ICD-10-CM | POA: Diagnosis not present

## 2016-03-02 MED ORDER — IOPAMIDOL (ISOVUE-M 200) INJECTION 41%
1.0000 mL | Freq: Once | INTRAMUSCULAR | Status: AC
  Administered 2016-03-02: 1 mL via EPIDURAL

## 2016-03-02 MED ORDER — METHYLPREDNISOLONE ACETATE 40 MG/ML INJ SUSP (RADIOLOG
120.0000 mg | Freq: Once | INTRAMUSCULAR | Status: AC
  Administered 2016-03-02: 120 mg via EPIDURAL

## 2016-03-02 NOTE — Discharge Instructions (Signed)

## 2016-03-03 ENCOUNTER — Other Ambulatory Visit: Payer: Self-pay | Admitting: Obstetrics and Gynecology

## 2016-03-03 DIAGNOSIS — N950 Postmenopausal bleeding: Secondary | ICD-10-CM

## 2016-03-04 ENCOUNTER — Ambulatory Visit (INDEPENDENT_AMBULATORY_CARE_PROVIDER_SITE_OTHER): Payer: Commercial Managed Care - HMO

## 2016-03-04 DIAGNOSIS — D259 Leiomyoma of uterus, unspecified: Secondary | ICD-10-CM | POA: Diagnosis not present

## 2016-03-04 DIAGNOSIS — N950 Postmenopausal bleeding: Secondary | ICD-10-CM | POA: Diagnosis not present

## 2016-03-04 NOTE — Progress Notes (Signed)
PELVIC US TA/TV: Heterogenous anteverted uterus w/mult.fibroids,largest fibroids (#1) calcified fibroid mid uterus right 2.7 x 2.2 x 2.4 cm,(#2)fundal rt 2.5 x 2 x 2.8 cm,complex fluid filled endometrium w/a 1.4 x .8 x .7 cm echogenic mass w/ a feeding vessel to mass,EEC 2.4 mm,normal ov's bilat,no free fluid,no pain during ultrasound.

## 2016-03-09 ENCOUNTER — Telehealth: Payer: Self-pay | Admitting: Obstetrics and Gynecology

## 2016-03-09 NOTE — Telephone Encounter (Signed)
Ultrasound shows a suspected endometrial polyp. Pt advised that  Removal of polyp recommended, and asked to make an appt to discuss and plan.

## 2016-03-15 ENCOUNTER — Telehealth: Payer: Self-pay | Admitting: Internal Medicine

## 2016-03-15 NOTE — Telephone Encounter (Signed)
[phone]   PT CALLED INQUIRING IF SHE COULD GET A PRESCRIPTION FOR PAIN MEDS DIE TO HER STOMACH

## 2016-03-16 NOTE — Telephone Encounter (Signed)
Number given is the cell number for pts sister-Lila, that Doris spoke with this morning. Not sure if this message is for [name] or [employer].  I tried to call this pt on her number, NA-LMOM asked her to call me back and let me know how she is doing.

## 2016-03-17 NOTE — Telephone Encounter (Signed)
Tried to call pt- NA-LM 

## 2016-03-18 NOTE — Telephone Encounter (Signed)
Tried to call pt- NA-LM. Closing this encounter. Doris has spoken with pts sister.

## 2016-03-22 ENCOUNTER — Ambulatory Visit: Payer: Commercial Managed Care - HMO | Admitting: Gastroenterology

## 2016-03-26 ENCOUNTER — Encounter: Payer: Self-pay | Admitting: Orthopedic Surgery

## 2016-03-26 ENCOUNTER — Ambulatory Visit (INDEPENDENT_AMBULATORY_CARE_PROVIDER_SITE_OTHER): Payer: Commercial Managed Care - HMO | Admitting: Orthopedic Surgery

## 2016-03-26 VITALS — BP 116/65 | HR 78 | Ht 65.0 in | Wt 169.0 lb

## 2016-03-26 DIAGNOSIS — G894 Chronic pain syndrome: Secondary | ICD-10-CM

## 2016-03-26 DIAGNOSIS — Z96652 Presence of left artificial knee joint: Secondary | ICD-10-CM

## 2016-03-26 DIAGNOSIS — M5442 Lumbago with sciatica, left side: Secondary | ICD-10-CM

## 2016-03-26 MED ORDER — IBUPROFEN 800 MG PO TABS: 800.0000 mg | ORAL_TABLET | Freq: Three times a day (TID) | ORAL | 5 refills | Status: DC | PRN

## 2016-03-26 MED ORDER — HYDROCODONE-ACETAMINOPHEN 7.5-325 MG PO TABS: 1.0000 | ORAL_TABLET | Freq: Three times a day (TID) | ORAL | 0 refills | Status: DC | PRN

## 2016-03-26 NOTE — Progress Notes (Signed)
Patient ID: Colleen Lee, female   DOB: 12-25-1955, 60 y.o.   MRN: [id number]  Chief Complaint  Patient presents with  . Follow-up    1 month recheck on on chronic pain.    HPI Colleen Lee is a 60 y.o. female.  Presents for recheck on her chronic lower back pain and left knee arthritis HPI ESI was given within the last 2-3 months.  Still complains of pain in her left leg and left lower back  Review of Systems Review of Systems  Denies fever Mild radicular pain  Past Medical History:  Diagnosis Date  . Asthma   . Chronic back pain   . Cirrhosis ([place])    Metavir score F4 on elastography 2015  . Fibroids   . Fibromyalgia   . GERD (gastroesophageal reflux disease)   . H. pylori infection 2014   treated with pylera, had to be treated again as it was not eradicated. Urea breath test then negative after subsequent treatment.   . [name]    HCV RNA positive 09/2012  . Hypertension   . PONV (postoperative nausea and vomiting)    pt thinks maybe once she had N&V  . Sciatica of left side      Examination BP 116/65   Pulse 78   Ht 5\' 5"  (1.651 m)   Wt 169 lb (76.7 kg)   BMI 28.12 kg/m   Gen. appearance the patient's appearance is normal with normal grooming and hygiene The patient is oriented to person place and time Mood and affect are flat   Ortho Exam She has a noticeable limp favoring her left leg  She has tenderness in her lower back midline L4-5 and L5-1 left SI joint left gluteal area  She has no gross motor weakness in the right or left lower extremity and both limbs are aligned normally  The left knee today is without effusion.  Medical decision-making  Diagnosis chronic pain, left knee arthritis  Data  Plan (risk) Refill ibuprofen and hydrocodone  Arther Abbott, MD 03/26/2016 9:23 AM

## 2016-03-27 ENCOUNTER — Encounter (HOSPITAL_COMMUNITY): Payer: Self-pay | Admitting: Emergency Medicine

## 2016-03-27 ENCOUNTER — Emergency Department (HOSPITAL_COMMUNITY): Payer: Commercial Managed Care - HMO

## 2016-03-27 ENCOUNTER — Emergency Department (HOSPITAL_COMMUNITY)
Admission: EM | Admit: 2016-03-27 | Discharge: 2016-03-27 | Disposition: A | Discharge status: Home / Self Care | Payer: Commercial Managed Care - HMO | Attending: Emergency Medicine | Admitting: Emergency Medicine

## 2016-03-27 DIAGNOSIS — I10 Essential (primary) hypertension: Secondary | ICD-10-CM | POA: Diagnosis not present

## 2016-03-27 DIAGNOSIS — J45909 Unspecified asthma, uncomplicated: Secondary | ICD-10-CM | POA: Insufficient documentation

## 2016-03-27 DIAGNOSIS — R079 Chest pain, unspecified: Secondary | ICD-10-CM | POA: Diagnosis not present

## 2016-03-27 DIAGNOSIS — Z791 Long term (current) use of non-steroidal anti-inflammatories (NSAID): Secondary | ICD-10-CM | POA: Diagnosis not present

## 2016-03-27 DIAGNOSIS — Z7982 Long term (current) use of aspirin: Secondary | ICD-10-CM | POA: Insufficient documentation

## 2016-03-27 DIAGNOSIS — Z792 Long term (current) use of antibiotics: Secondary | ICD-10-CM | POA: Insufficient documentation

## 2016-03-27 DIAGNOSIS — F17210 Nicotine dependence, cigarettes, uncomplicated: Secondary | ICD-10-CM | POA: Diagnosis not present

## 2016-03-27 LAB — CBC WITH DIFFERENTIAL/PLATELET
Basophils Absolute: 0 10*3/uL (ref 0.0–0.1)
Basophils Relative: 0 %
Eosinophils Absolute: 0.2 10*3/uL (ref 0.0–0.7)
Eosinophils Relative: 3 %
HCT: 36.7 % (ref 36.0–46.0)
Hemoglobin: 12.2 g/dL (ref 12.0–15.0)
Lymphocytes Relative: 25 %
Lymphs Abs: 1.4 10*3/uL (ref 0.7–4.0)
MCH: 30.2 pg (ref 26.0–34.0)
MCHC: 33.2 g/dL (ref 30.0–36.0)
MCV: 90.8 fL (ref 78.0–100.0)
Monocytes Absolute: 0.4 10*3/uL (ref 0.1–1.0)
Monocytes Relative: 6 %
Neutro Abs: 3.7 10*3/uL (ref 1.7–7.7)
Neutrophils Relative %: 66 %
Platelets: 186 10*3/uL (ref 150–400)
RBC: 4.04 MIL/uL (ref 3.87–5.11)
RDW: 14.3 % (ref 11.5–15.5)
WBC: 5.7 10*3/uL (ref 4.0–10.5)

## 2016-03-27 LAB — BASIC METABOLIC PANEL WITH GFR
Anion gap: 2 — ABNORMAL LOW (ref 5–15)
BUN: 7 mg/dL (ref 6–20)
CO2: 22 mmol/L (ref 22–32)
Calcium: 9.4 mg/dL (ref 8.9–10.3)
Chloride: 110 mmol/L (ref 101–111)
Creatinine, Ser: 0.64 mg/dL (ref 0.44–1.00)
GFR calc Af Amer: >60 mL/min
GFR calc non Af Amer: >60 mL/min
Glucose, Bld: 100 mg/dL — ABNORMAL HIGH (ref 65–99)
Potassium: 4 mmol/L (ref 3.5–5.1)
Sodium: 134 mmol/L — ABNORMAL LOW (ref 135–145)

## 2016-03-27 LAB — TROPONIN I: Troponin I: <0.03 ng/mL (ref <0.03)

## 2016-03-27 MED ORDER — HYDROMORPHONE HCL 1 MG/ML IJ SOLN
1.0000 mg | Freq: Once | INTRAMUSCULAR | Status: AC
  Administered 2016-03-27: 1 mg via INTRAVENOUS
  Filled 2016-03-27: qty 1

## 2016-03-27 MED ORDER — KETOROLAC TROMETHAMINE 30 MG/ML IJ SOLN
15.0000 mg | Freq: Once | INTRAMUSCULAR | Status: AC
  Administered 2016-03-27: 15 mg via INTRAVENOUS
  Filled 2016-03-27: qty 1

## 2016-03-27 NOTE — ED Provider Notes (Signed)
[facility] DEPT Provider Note   CSN: [id number] Arrival date & time: 03/27/16  1219   By signing my name below, I, Royce Macadamia, attest that this documentation has been prepared under the direction and in the presence of Virgel Manifold, MD . Electronically Signed: Royce Macadamia, Scribe. 03/27/2016. 12:44 PM.   History   Chief Complaint Chief Complaint  Patient presents with  . Chest Pain   The history is provided by the patient and medical records. No language interpreter was used.   HPI Comments:  Colleen Lee is a 60 y.o. female with a h/o of GERD, HTN and asthma who presents to the Emergency Department complaining of gradually worsening sharp pain in her right chest radiating to her back beginning three days ago.  She states that her pain is worse when breathing, drinking and eating.  She took pain medication from her knee surgery with minimal relief.  She denies SOB, cough, nausea and vomitting.      Past Medical History:  Diagnosis Date  . Asthma   . Chronic back pain   . Cirrhosis ([place])    Metavir score F4 on elastography 2015  . Fibroids   . Fibromyalgia   . GERD (gastroesophageal reflux disease)   . H. pylori infection 2014   treated with pylera, had to be treated again as it was not eradicated. Urea breath test then negative after subsequent treatment.   . [name]    HCV RNA positive 09/2012  . Hypertension   . PONV (postoperative nausea and vomiting)    pt thinks maybe once she had N&V  . Sciatica of left side     Patient Active Problem List   Diagnosis Date Noted  . PMB (postmenopausal bleeding) 02/27/2016  . Alcoholic cirrhosis of liver without ascites ([place])   . Asthma 01/21/2016  . Hepatic cirrhosis ([place]) 09/22/2015  . Arthritis of knee, degenerative 02/05/2015  . History of Helicobacter pylori infection 10/30/2014  . Chronic [name] with cirrhosis ([place]) 04/11/2014  . De Quervain's disease (radial styloid tenosynovitis) 12/25/2013    . Anorexia 11/21/2012  . FH: colon cancer 11/21/2012  . Early satiety 10/25/2012  . Bowel habit changes 10/25/2012  . Abdominal pain, epigastric 10/25/2012  . Abdominal bloating 10/25/2012  . Constipation 10/25/2012  . Abnormal weight loss 10/25/2012  . Chronic viral [name] ([place]) 10/25/2012  . Radicular pain of left lower extremity 09/28/2012  . Back pain 09/28/2012  . Sciatica 08/10/2011  . S/P arthroscopy of left knee 08/10/2011  . Tibial plateau fracture 08/10/2011  . Pain in joint, lower leg 02/12/2011  . Stiffness of joint, not elsewhere classified, lower leg 02/12/2011  . 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 Meniscus, medial, derangement 12/29/2010  . CLOSED FRACTURE OF UPPER END OF TIBIA 08/12/2010    Past Surgical History:  Procedure Laterality Date  . COLONOSCOPY  01/18/2008   [id number] rectum.  Long redundant colon, a diminutive sigmoid polyp status post cold biopsy removed. Hyperplastic polyp. Repeat colonoscopy June 2014 due to family history of colon cancer  . COLONOSCOPY WITH ESOPHAGOGASTRODUODENOSCOPY (EGD) N/A 11/02/2012   [id number] gastric mucosa of doubtful, +H.pylori. Incomplete colonoscopy due to patient unable to tolerate exam, proximal colon seen. Patient refused ACBE.  Marland Kitchen COLONOSCOPY WITH PROPOFOL N/A 03/08/2013   Dr. Gala Romney: colonic polyp-removed as scribed above. Internal Hemorrhoids. Pathology did not reveal any colonic tissue, only mucus. SURVEILLANCE DUE Aug 2019  . ESOPHAGOGASTRODUODENOSCOPY  01/18/2008   RMR: Normal esophagus, normal  stomach  . ESOPHAGOGASTRODUODENOSCOPY (  EGD) WITH PROPOFOL N/A 02/09/2016   Procedure: ESOPHAGOGASTRODUODENOSCOPY (EGD) WITH PROPOFOL;  Surgeon: Daneil Dolin, MD;  [facility];  Service: Endoscopy;  Laterality: N/A;  730   . KNEE SURGERY     left knee  . MULTIPLE EXTRACTIONS WITH ALVEOLOPLASTY N/A 10/15/2013   Procedure: MULTIPLE EXTRACTION WITH ALVEOLOPLASTY;  Surgeon: Gae Bon, DDS;  Location:  [place];  Service: Oral Surgery;  Laterality: N/A;  . POLYPECTOMY N/A 03/08/2013   Procedure: POLYPECTOMY;  Surgeon: Daneil Dolin, MD;  Location: [facility];  Service: Endoscopy;  Laterality: N/A;  . TOTAL KNEE ARTHROPLASTY Left 02/05/2015   Procedure: LEFT TOTAL KNEE ARTHROPLASTY;  Surgeon: Carole Civil, MD;  Location: [facility];  Service: Orthopedics;  Laterality: Left;    OB History    Gravida Para Term Preterm AB Living             1   SAB TAB Ectopic Multiple Live Births                   Home Medications    Prior to Admission medications   Medication Sig Start Date End Date Taking? Authorizing Provider  amLODipine (NORVASC) 10 MG tablet Take 1 tablet by mouth daily. Take along with 5 mg for a total of 15 mg. 08/07/15   Historical Provider, MD  aspirin EC 325 MG EC tablet Take 1 tablet (325 mg total) by mouth 2 (two) times daily. 02/08/15   Carole Civil, MD  budesonide-formoterol [facility]) 160-4.5 MCG/ACT inhaler Inhale 2 puffs into the lungs 2 (two) times daily.    Historical Provider, MD  Cyanocobalamin (VITAMIN B 12 PO) Take 1 tablet by mouth daily.    Historical Provider, MD  cyclobenzaprine (FLEXERIL) 10 MG tablet Take 1 tablet (10 mg total) by mouth 3 (three) times daily. 02/24/16   Carole Civil, MD  esomeprazole (NEXIUM) 40 MG capsule Take 1 capsule (40 mg total) by mouth every morning. 12/19/14   Annitta Needs, NP  HYDROcodone-acetaminophen (NORCO) 7.5-325 MG tablet Take 1 tablet by mouth every 8 (eight) hours as needed for moderate pain. 03/26/16   Carole Civil, MD  ibuprofen (ADVIL,MOTRIN) 800 MG tablet Take 1 tablet (800 mg total) by mouth every 8 (eight) hours as needed. 03/26/16   Carole Civil, MD  lisinopril (PRINIVIL,ZESTRIL) 20 MG tablet Take 1 tablet by mouth daily. 09/25/15   Historical Provider, MD  megestrol (MEGACE) 40 MG tablet Take 40 mg by mouth daily.    Historical Provider, MD  metroNIDAZOLE (FLAGYL) 500 MG tablet One po qid 11/24/15   Milton Ferguson, MD  ondansetron (ZOFRAN ODT) 4 MG disintegrating tablet 103m ODT q4 hours prn nausea/vomit 11/24/15   JMilton Ferguson MD  oxyCODONE-acetaminophen (PERCOCET/ROXICET) 5-325 MG tablet Take 1 tablet by mouth every 6 (six) hours as needed. 11/24/15   JMilton Ferguson MD  SPIRIVA HANDIHALER 18 MCG inhalation capsule Place 18 mcg into inhaler and inhale daily. 10/23/15   Historical Provider, MD    Family History Family History  Problem Relation Age of Onset  . Colon cancer Brother 527      . Asthma      FH  . Multiple myeloma Brother   . Liver cancer Sister   . Prostate cancer Brother   . Pancreatic cancer Brother   . Cancer Other     niece, age 61 primary unknown    Social History Social History  Substance Use Topics  . Smoking  status: Current Every Day Smoker    Packs/day: 1.00    Years: 40.00    Types: Cigarettes  . Smokeless tobacco: Never Used     Comment: Smokes one pack of cigarettes daily  . Alcohol use No     Comment: former, last Jan 2014     Allergies   Penicillins   Review of Systems Review of Systems  Respiratory: Negative for cough and shortness of breath.   Cardiovascular: Positive for chest pain.  Gastrointestinal: Negative for nausea and vomiting.     Physical Exam Updated Vital Signs BP 118/80 (BP Location: Left Arm)   Pulse 79   Temp 98 F (36.7 C) (Oral)   Resp 18   Ht 5' 6" (1.676 m)   Wt 170 lb (77.1 kg)   SpO2 100%   BMI 27.44 kg/m   Physical Exam  Constitutional: She is oriented to person, place, and time. She appears well-developed and well-nourished. No distress.  HENT:  Head: Normocephalic and atraumatic.  Eyes: Conjunctivae are normal.  Cardiovascular: Normal rate.   Pulmonary/Chest: Effort normal.  Course breath sounds right side.  Neurological: She is alert and oriented to person, place, and time.  Skin: Skin is warm and dry.  Psychiatric: She has a normal mood and affect.  Nursing note and vitals reviewed.    ED  Treatments / Results   DIAGNOSTIC STUDIES:  Oxygen Saturation is 100% on RA, nml by my interpretation.    COORDINATION OF CARE:  12:44 PM Discussed treatment plan with pt at bedside and pt agreed to plan.  Labs (all labs ordered are listed, but only abnormal results are displayed) Labs Reviewed  BASIC METABOLIC PANEL - Abnormal; Notable for the following:       Result Value   Sodium 134 (*)    Glucose, Bld 100 (*)    Anion gap 2 (*)    All other components within normal limits  CBC WITH DIFFERENTIAL/PLATELET  TROPONIN I    EKG  EKG Interpretation  Date/Time:  Saturday March 27 2016 12:27:32 EDT Ventricular Rate:  74 PR Interval:    QRS Duration: 84 QT Interval:  380 QTC Calculation: 422 R Axis:   69 Text Interpretation:  Sinus rhythm Confirmed by Wilson Singer  MD,  ([id number]) on 03/27/2016 12:35:08 PM       Radiology No results found.  Dg Chest 2 View  Result Date: 03/27/2016 CLINICAL DATA:  Right-sided chest pain begins under right breast and radiates through to right side of upper back for 2 days. EXAM: CHEST  2 VIEW COMPARISON:  10/11/2015 FINDINGS: Lungs are hyperexpanded. The lungs are clear wiithout focal pneumonia, edema, pneumothorax or pleural effusion. Interstitial markings are diffusely coarsened with chronic features. Nodular densities projecting over each lower lung symmetrically have been seen on multiple prior exams and remain compatible with nipple shadows. The visualized bony structures of the thorax are intact. Telemetry leads overlie the chest. IMPRESSION: Hyperexpansion.  No acute findings. Electronically Signed   By: Misty Stanley M.D.   On: 03/27/2016 13:48   Procedures Procedures (including critical care time)  Medications Ordered in ED Medications  ketorolac (TORADOL) 30 MG/ML injection 15 mg (15 mg Intravenous Given 03/27/16 1314)  HYDROmorphone (DILAUDID) injection 1 mg (1 mg Intravenous Given 03/27/16 1315)     Initial Impression / Assessment  and Plan / ED Course  I have reviewed the triage vital signs and the nursing notes.  Pertinent labs & imaging results that were available during my care  of the patient were reviewed by me and considered in my medical decision making (see chart for details).  Clinical Course    [age]-year-old female with chest pain. Sounds atypical for ACS. Normal troponin after 3 days of symptoms. I get the sense that this might be more dyspepsia than anything else. I doubt emergent intrathoracic or intra-abdominal process. She is on a PPI. Advised that she could double this for the next few days. Advised to try abstaining from tobacco and alcohol. Sparing use of NSAIDs. Return precautions were discussed. Outpatient follow-up otherwise.   Final Clinical Impressions(s) / ED Diagnoses   Final diagnoses:  Chest pain, unspecified chest pain type    New Prescriptions Discharge Medication List as of 03/27/2016  2:18 PM     I personally preformed the services scribed in my presence. The recorded information has been reviewed is accurate. Virgel Manifold, MD.     Virgel Manifold, MD 04/11/16 [phone]

## 2016-03-27 NOTE — ED Notes (Signed)
Pt. returned from XR. 

## 2016-03-27 NOTE — ED Notes (Signed)
EDP at bedside  

## 2016-03-27 NOTE — ED Triage Notes (Addendum)
Pt states she has been having pain in right side of chest under breast into back.  Hurts worse to drink anything. Began 2 days ago.

## 2016-03-29 ENCOUNTER — Ambulatory Visit (HOSPITAL_COMMUNITY): Admission: RE | Admit: 2016-03-29 | Payer: Commercial Managed Care - HMO | Source: Ambulatory Visit

## 2016-03-31 ENCOUNTER — Ambulatory Visit (INDEPENDENT_AMBULATORY_CARE_PROVIDER_SITE_OTHER): Payer: Commercial Managed Care - HMO | Admitting: Obstetrics and Gynecology

## 2016-03-31 ENCOUNTER — Encounter (INDEPENDENT_AMBULATORY_CARE_PROVIDER_SITE_OTHER): Payer: Self-pay

## 2016-03-31 ENCOUNTER — Other Ambulatory Visit: Payer: Self-pay | Admitting: Obstetrics and Gynecology

## 2016-03-31 ENCOUNTER — Encounter: Payer: Self-pay | Admitting: Obstetrics and Gynecology

## 2016-03-31 VITALS — BP 120/78 | HR 92 | Ht 66.0 in | Wt 172.6 lb

## 2016-03-31 DIAGNOSIS — N840 Polyp of corpus uteri: Secondary | ICD-10-CM | POA: Diagnosis not present

## 2016-03-31 DIAGNOSIS — N950 Postmenopausal bleeding: Secondary | ICD-10-CM | POA: Diagnosis not present

## 2016-03-31 NOTE — Addendum Note (Signed)
Addended by: Doyne Keel on: 03/31/2016 03:01 PM   Modules accepted: Orders

## 2016-03-31 NOTE — Progress Notes (Signed)
Endometrial Biopsy: Patient given informed consent, signed copy in the chart, time out was performed. Time out taken. The patient was placed in the lithotomy position and the cervix brought into view with sterile speculum.  Portio of cervix cleansed x 2 with betadine swabs.  A tenaculum was placed in the anterior lip of the cervix. The uterus was sounded for depth of 8 cm,. Milex uterine Explora 3 mm was introduced to into the uterus, suction created,  and an endometrial sample was obtained and old dark blood and tissue fragments obtained.. All equipment was removed and accounted for.   The patient tolerated the procedure well.   Patient given post procedure instructions.   A:  1. Postmenopausal bleeding, suspected endometrial polyp  P: 1. Follow up in one week for results  By signing my name below, I, Colleen Lee, attest that this documentation has been prepared under the direction and in the presence of Colleen Kind, MD. Electronically Signed: Soijett Lee, ED Scribe. 03/31/16. 2:16 PM.   I personally performed the services described in this documentation, which was SCRIBED in my presence. The recorded information has been reviewed and considered accurate. It has been edited as necessary during review. Colleen Kind, MD

## 2016-04-09 ENCOUNTER — Encounter: Payer: Self-pay | Admitting: Obstetrics and Gynecology

## 2016-04-09 ENCOUNTER — Ambulatory Visit (INDEPENDENT_AMBULATORY_CARE_PROVIDER_SITE_OTHER): Payer: Commercial Managed Care - HMO | Admitting: Obstetrics and Gynecology

## 2016-04-09 DIAGNOSIS — N950 Postmenopausal bleeding: Secondary | ICD-10-CM | POA: Diagnosis not present

## 2016-04-09 DIAGNOSIS — D259 Leiomyoma of uterus, unspecified: Secondary | ICD-10-CM | POA: Diagnosis not present

## 2016-04-09 DIAGNOSIS — N840 Polyp of corpus uteri: Secondary | ICD-10-CM

## 2016-04-09 NOTE — Progress Notes (Signed)
[facility] Visit  04/09/16          Patient name: Colleen Lee MRN [id number]  Date of birth: 09-Jun-1956  CC & HPI:  Colleen Lee is a 60 y.o. female presenting today for discussion in regards to her endometrial biopsy onset today. Pt states that she had an endometrial biopsy completed on 03/31/16 due to postmenopausal bleeding. Pt has associated symptoms of resolved vaginal bleeding and abdominal cramping. Pt has not tried any medications for the relief of her symptoms. Pt denies any other symptoms.    ROS:  Review of Systems  Constitutional: Negative for chills and fever.  Gastrointestinal: Positive for abdominal pain (cramping).  Genitourinary:       +Resolved vaginal bleeding     Pertinent History Reviewed:   Reviewed: Significant for HTN Medical         Past Medical History:  Diagnosis Date  . Asthma   . Chronic back pain   . Cirrhosis ([place])    Metavir score F4 on elastography 2015  . Fibroids   . Fibromyalgia   . GERD (gastroesophageal reflux disease)   . H. pylori infection 2014   treated with pylera, had to be treated again as it was not eradicated. Urea breath test then negative after subsequent treatment.   . [name]    HCV RNA positive 09/2012  . Hypertension   . PONV (postoperative nausea and vomiting)    pt thinks maybe once she had N&V  . Sciatica of left side                               Surgical Hx:    Past Surgical History:  Procedure Laterality Date  . COLONOSCOPY  01/18/2008   [id number] rectum.  Long redundant colon, a diminutive sigmoid polyp status post cold biopsy removed. Hyperplastic polyp. Repeat colonoscopy June 2014 due to family history of colon cancer  . COLONOSCOPY WITH ESOPHAGOGASTRODUODENOSCOPY (EGD) N/A 11/02/2012   [id number] gastric mucosa of doubtful, +H.pylori. Incomplete colonoscopy due to patient unable to tolerate exam, proximal colon seen. Patient refused ACBE.  Marland Kitchen COLONOSCOPY WITH PROPOFOL N/A 03/08/2013   Dr.  Gala Romney: colonic polyp-removed as scribed above. Internal Hemorrhoids. Pathology did not reveal any colonic tissue, only mucus. SURVEILLANCE DUE Aug 2019  . ESOPHAGOGASTRODUODENOSCOPY  01/18/2008   RMR: Normal esophagus, normal  stomach  . ESOPHAGOGASTRODUODENOSCOPY (EGD) WITH PROPOFOL N/A 02/09/2016   Procedure: ESOPHAGOGASTRODUODENOSCOPY (EGD) WITH PROPOFOL;  Surgeon: Daneil Dolin, MD;  [facility];  Service: Endoscopy;  Laterality: N/A;  730   . KNEE SURGERY     left knee  . MULTIPLE EXTRACTIONS WITH ALVEOLOPLASTY N/A 10/15/2013   Procedure: MULTIPLE EXTRACTION WITH ALVEOLOPLASTY;  Surgeon: Gae Bon, DDS;  Location: [place];  Service: Oral Surgery;  Laterality: N/A;  . POLYPECTOMY N/A 03/08/2013   Procedure: POLYPECTOMY;  Surgeon: Daneil Dolin, MD;  Location: [facility];  Service: Endoscopy;  Laterality: N/A;  . TOTAL KNEE ARTHROPLASTY Left 02/05/2015   Procedure: LEFT TOTAL KNEE ARTHROPLASTY;  Surgeon: Carole Civil, MD;  Location: [facility];  Service: Orthopedics;  Laterality: Left;   Medications: Reviewed & Updated - see associated section                       Current Outpatient Prescriptions:  .  amLODipine (NORVASC) 10 MG tablet, Take 1 tablet by mouth daily. Take along  with 5 mg for a total of 15 mg., Disp: , Rfl:  .  aspirin EC 325 MG EC tablet, Take 1 tablet (325 mg total) by mouth 2 (two) times daily., Disp: 60 tablet, Rfl: 0 .  budesonide-formoterol (SYMBICORT) 160-4.5 MCG/ACT inhaler, Inhale 2 puffs into the lungs 2 (two) times daily., Disp: , Rfl:  .  Cyanocobalamin (VITAMIN B 12 PO), Take 1 tablet by mouth daily., Disp: , Rfl:  .  cyclobenzaprine (FLEXERIL) 10 MG tablet, Take 1 tablet (10 mg total) by mouth 3 (three) times daily., Disp: 90 tablet, Rfl: 5 .  esomeprazole (NEXIUM) 40 MG capsule, Take 1 capsule (40 mg total) by mouth every morning., Disp: 10 capsule, Rfl: 0 .  HYDROcodone-acetaminophen (NORCO) 7.5-325 MG tablet, Take 1 tablet by mouth every 8 (eight)  hours as needed for moderate pain., Disp: 90 tablet, Rfl: 0 .  ibuprofen (ADVIL,MOTRIN) 800 MG tablet, Take 1 tablet (800 mg total) by mouth every 8 (eight) hours as needed., Disp: 90 tablet, Rfl: 5 .  lisinopril (PRINIVIL,ZESTRIL) 20 MG tablet, Take 1 tablet by mouth daily., Disp: , Rfl:  .  megestrol (MEGACE) 40 MG tablet, Take 40 mg by mouth daily., Disp: , Rfl:  .  ondansetron (ZOFRAN ODT) 4 MG disintegrating tablet, 4mg  ODT q4 hours prn nausea/vomit, Disp: 12 tablet, Rfl: 0 .  oxyCODONE-acetaminophen (PERCOCET/ROXICET) 5-325 MG tablet, Take 1 tablet by mouth every 6 (six) hours as needed., Disp: 20 tablet, Rfl: 0   Social History: Reviewed -  reports that she has been smoking Cigarettes.  She has a 40.00 pack-year smoking history. She has never used smokeless tobacco.  Objective Findings:  Vitals: There were no vitals taken for this visit.  Physical Examination:  Pelvic - normal external genitalia, vulva, vagina, cervix, uterus and adnexa,  VULVA: normal appearing vulva with no masses, tenderness or lesions,  VAGINA: normal appearing vagina with normal color and discharge, no lesions, atrophic,  CERVIX: normal appearing cervix without discharge or lesions, tiny UTERUS: uterus is normal shape, consistency and nontender, slightly enlarged to 6-8 week's size due to fibroids ADNEXA: normal adnexa in size, nontender and no masses   Assessment & Plan:   A:  1. Asymptomatic uterine fibroid  2. Endo polyp  3. Post menopausal bleeding  P:  1. Follow up in 3-4 weeks for pre op for hysteroscopy and polyp removal  By signing my name below, I, Soijett Blue, attest that this documentation has been prepared under the direction and in the presence of Jonnie Kind, MD. Electronically Signed: Soijett Blue, ED Scribe. 04/09/16. 10:49 AM.  I personally performed the services described in this documentation, which was SCRIBED in my presence. The recorded information has been reviewed and  considered accurate. It has been edited as necessary during review. Jonnie Kind, MD

## 2016-04-15 DIAGNOSIS — N840 Polyp of corpus uteri: Secondary | ICD-10-CM | POA: Insufficient documentation

## 2016-04-22 ENCOUNTER — Other Ambulatory Visit: Payer: Self-pay | Admitting: *Deleted

## 2016-04-22 NOTE — Patient Outreach (Addendum)
[facility]) Care Management  04/22/2016  Colleen Lee July 14, 1956 [id number]  Referral from Tier 4 High Risk List:  Telephone call to patient who was advised of reason for call & of [facility] care management services. HIPPA verification received from patient.   Patient voices that she does not have any health care concerns currently and does not need case management services at this time.  Patient voices that she has access to her doctors and takes self to appointments. States she is attending primary care appointments every 3 months.  Voices that she is using local pharmacy to get prescriptions filled and has no trouble with obtaining medications. Voices that she manages own medications & is taking them consistently as instructed by her MD.   Patient has declined [facility] services-community & telephonic. States she will accept [facility] contact information to use if needed.  Plan: Close case. Send to care management assistant. Send MD closure letter. Send [facility] contact information to patient.   Sherrin Daisy, RN BSN [place] Management Coordinator [facility] Care Management  [phone]

## 2016-04-23 ENCOUNTER — Encounter: Payer: Self-pay | Admitting: *Deleted

## 2016-04-26 ENCOUNTER — Other Ambulatory Visit: Payer: Self-pay | Admitting: *Deleted

## 2016-04-26 ENCOUNTER — Telehealth: Payer: Self-pay | Admitting: Orthopedic Surgery

## 2016-04-26 MED ORDER — HYDROCODONE-ACETAMINOPHEN 7.5-325 MG PO TABS: 1.0000 | ORAL_TABLET | Freq: Three times a day (TID) | ORAL | 0 refills | Status: DC | PRN

## 2016-04-26 NOTE — Telephone Encounter (Signed)
Patient requests refill:  °HYDROcodone-acetaminophen (NORCO) 7.5-325 MG tablet 90 tablet  ° ° °

## 2016-04-26 NOTE — Telephone Encounter (Signed)
REFILLED

## 2016-05-03 ENCOUNTER — Encounter: Payer: Self-pay | Admitting: Obstetrics and Gynecology

## 2016-05-03 ENCOUNTER — Ambulatory Visit (INDEPENDENT_AMBULATORY_CARE_PROVIDER_SITE_OTHER): Payer: Commercial Managed Care - HMO | Admitting: Obstetrics and Gynecology

## 2016-05-03 ENCOUNTER — Other Ambulatory Visit: Payer: Self-pay | Admitting: Obstetrics and Gynecology

## 2016-05-03 VITALS — BP 120/70 | HR 62 | Ht 66.0 in | Wt 173.2 lb

## 2016-05-03 DIAGNOSIS — N840 Polyp of corpus uteri: Secondary | ICD-10-CM

## 2016-05-03 DIAGNOSIS — N950 Postmenopausal bleeding: Secondary | ICD-10-CM | POA: Diagnosis not present

## 2016-05-03 NOTE — Progress Notes (Signed)
Progress Notes Encounter Date: 05/03/2016 Jonnie Kind, MD  Obstetrics/Gynecology  Expand All Collapse All   '[]'$ Hide copied text Patient ID: Colleen Lee, female   DOB: November 04, 1955, 60 y.o.   MRN: [id number]      Chief Complaint  Patient presents with  . Pre-op Exam    Hysteroscopy and polyp removal     Preoperative History and Physical  Colleen Lee is a 60 y.o. No obstetric history on file. here for surgical management of uterine fibroids, endometrial polyp, PMB.    Endometrial biopsy on 03/31/16 showing endometrial polyp with progestin effect (no evidence of malignancy).   Pelvic US on 03/04/16 showed complex fluid filled endometrium w/a 1.4 x .8 x .7 cm echogenic mass w/a feeding vessel to mass.   Pt states her PMB has resolved; however her lower abdominal cramping has persisted. No significant preoperative concerns.  Discussed with pt risks and benefits of hysteroscopy and polyp removal. Discussed the likelihood that the procedure may become hysterectomy. At end of discussion, pt had opportunity to ask questions and has no further questions at this time.   Greater than 50% was spent in counseling and coordination of care with the patient. Total time greater than: 15 minutes   Proposed surgery: hysteroscopy and polyp removal       Past Medical History:  Diagnosis Date  . Asthma   . Chronic back pain   . Cirrhosis ([place])    Metavir score F4 on elastography 2015  . Fibroids   . Fibromyalgia   . GERD (gastroesophageal reflux disease)   . H. pylori infection 2014   treated with pylera, had to be treated again as it was not eradicated. Urea breath test then negative after subsequent treatment.   . [name]    HCV RNA positive 09/2012  . Hypertension   . PONV (postoperative nausea and vomiting)    pt thinks maybe once she had N&V  . Sciatica of left side         Past Surgical History:  Procedure Laterality Date  . COLONOSCOPY  01/18/2008    PQZ:RAQTMA rectum.  Long redundant colon, a diminutive sigmoid polyp status post cold biopsy removed. Hyperplastic polyp. Repeat colonoscopy June 2014 due to family history of colon cancer  . COLONOSCOPY WITH ESOPHAGOGASTRODUODENOSCOPY (EGD) N/A 11/02/2012   UQJ:[id number] gastric mucosa of doubtful, +H.pylori. Incomplete colonoscopy due to patient unable to tolerate exam, proximal colon seen. Patient refused ACBE.  Marland Kitchen COLONOSCOPY WITH PROPOFOL N/A 03/08/2013   Dr. Gala Romney: colonic polyp-removed as scribed above. Internal Hemorrhoids. Pathology did not reveal any colonic tissue, only mucus. SURVEILLANCE DUE Aug 2019  . ESOPHAGOGASTRODUODENOSCOPY  01/18/2008   RMR: Normal esophagus, normal  stomach  . ESOPHAGOGASTRODUODENOSCOPY (EGD) WITH PROPOFOL N/A 02/09/2016   Procedure: ESOPHAGOGASTRODUODENOSCOPY (EGD) WITH PROPOFOL;  Surgeon: Daneil Dolin, MD;  Location: [facility];  Service: Endoscopy;  Laterality: N/A;  730  . KNEE SURGERY     left knee  . MULTIPLE EXTRACTIONS WITH ALVEOLOPLASTY N/A 10/15/2013   Procedure: MULTIPLE EXTRACTION WITH ALVEOLOPLASTY;  Surgeon: Gae Bon, DDS;  Location: [facility];  Service: Oral Surgery;  Laterality: N/A;  . POLYPECTOMY N/A 03/08/2013   Procedure: POLYPECTOMY;  Surgeon: Daneil Dolin, MD;  Location: [facility];  Service: Endoscopy;  Laterality: N/A;  . TOTAL KNEE ARTHROPLASTY Left 02/05/2015   Procedure: LEFT TOTAL KNEE ARTHROPLASTY;  Surgeon: Carole Civil, MD;  Location: [facility];  Service: Orthopedics;  Laterality: Left;  OB History  Gravida Para Term Preterm AB Living            1  SAB TAB Ectopic Multiple Live Births                 Patient denies any other pertinent gynecologic issues.         Current Outpatient Prescriptions on File Prior to Visit  Medication Sig Dispense Refill  . amLODipine (NORVASC) 10 MG tablet Take 1 tablet by mouth daily. Take along with 5 mg for a total of 15 mg.    . aspirin EC 325 MG EC tablet  Take 1 tablet (325 mg total) by mouth 2 (two) times daily. 60 tablet 0  . budesonide-formoterol (SYMBICORT) 160-4.5 MCG/ACT inhaler Inhale 2 puffs into the lungs 2 (two) times daily.    . Cyanocobalamin (VITAMIN B 12 PO) Take 1 tablet by mouth daily.    . cyclobenzaprine (FLEXERIL) 10 MG tablet Take 1 tablet (10 mg total) by mouth 3 (three) times daily. 90 tablet 5  . esomeprazole (NEXIUM) 40 MG capsule Take 1 capsule (40 mg total) by mouth every morning. 10 capsule 0  . HYDROcodone-acetaminophen (NORCO) 7.5-325 MG tablet Take 1 tablet by mouth every 8 (eight) hours as needed for moderate pain. 90 tablet 0  . ibuprofen (ADVIL,MOTRIN) 800 MG tablet Take 1 tablet (800 mg total) by mouth every 8 (eight) hours as needed. 90 tablet 5  . lisinopril (PRINIVIL,ZESTRIL) 20 MG tablet Take 1 tablet by mouth daily.    . megestrol (MEGACE) 40 MG tablet Take 40 mg by mouth daily.    . ondansetron (ZOFRAN ODT) 4 MG disintegrating tablet '4mg'$  ODT q4 hours prn nausea/vomit 12 tablet 0  . oxyCODONE-acetaminophen (PERCOCET/ROXICET) 5-325 MG tablet Take 1 tablet by mouth every 6 (six) hours as needed. 20 tablet 0   No current facility-administered medications on file prior to visit.         Allergies  Allergen Reactions  . Penicillins Rash    Has patient had a PCN reaction causing immediate rash, facial/tongue/throat swelling, SOB or lightheadedness with hypotension: Yes Has patient had a PCN reaction causing severe rash involving mucus membranes or skin necrosis: No Has patient had a PCN reaction that required hospitalization No Has patient had a PCN reaction occurring within the last 10 years: No If all of the above answers are "NO", then may proceed with Cephalosporin use.    Social History:   reports that she has been smoking Cigarettes.  She has a 40.00 pack-year smoking history. She has never used smokeless tobacco. She reports that she does not drink alcohol or use drugs.        Family  History  Problem Relation Age of Onset  . Colon cancer Brother 56       . Multiple myeloma Brother   . Liver cancer Sister   . Prostate cancer Brother   . Pancreatic cancer Brother   . Cancer Mother     breast  . Asthma Mother     Review of Systems: Persistent lower abdominal cramping   PHYSICAL EXAM: Blood pressure 120/70, pulse 62, height '5\' 6"'$  (1.676 m), weight 173 lb 3.2 oz (78.6 kg). General appearance - alert, well appearing, and in no distress Chest - clear to auscultation, no wheezes, rales or rhonchi, symmetric air entry Heart - normal rate and regular rhythm Abdomen - soft, nontender, nondistended, well-healed cesarean scar, thick abdominal wall with no masses appreciated  Pelvic -  VULVA:  normal appearing vulva with no masses, tenderness or lesions,  VAGINA: normal appearing vagina with normal color and discharge, no lesions,  CERVIX: no discharge or lesions, normal appearing cervix, small, no obvious polyp protrusion  UTERUS: uterus is normal size, shape, consistency and nontender, normal bimanual exam  ADNEXA: normal adnexa in size, nontender and no masses.  Extremities - peripheral pulses normal, no pedal edema, no clubbing or cyanosis  Labs: No results found for this or any previous visit (from the past 336 hour(s)).  Imaging Studies: Imaging Results  No results found.    Assessment:     Patient Active Problem List   Diagnosis Date Noted  . Endometrial polyp 04/15/2016  . PMB (postmenopausal bleeding) 02/27/2016  . Alcoholic cirrhosis of liver without ascites ([place])   . Asthma 01/21/2016  . Hepatic cirrhosis ([address]) 09/22/2015  . Arthritis of knee, degenerative 02/05/2015  . History of Helicobacter pylori infection 10/30/2014  . Chronic [name] with cirrhosis ([place]) 04/11/2014  . De Quervain's disease (radial styloid tenosynovitis) 12/25/2013  . Anorexia 11/21/2012  . FH: colon cancer 11/21/2012  . Early satiety 10/25/2012  . Bowel  habit changes 10/25/2012  . Abdominal pain, epigastric 10/25/2012  . Abdominal bloating 10/25/2012  . Constipation 10/25/2012  . Abnormal weight loss 10/25/2012  . Chronic viral [name] ([place]) 10/25/2012  . Radicular pain of left lower extremity 09/28/2012  . Back pain 09/28/2012  . Sciatica 08/10/2011  . S/P arthroscopy of left knee 08/10/2011  . Tibial plateau fracture 08/10/2011  . Pain in joint, lower leg 02/12/2011  . Stiffness of joint, not elsewhere classified, lower leg 02/12/2011  . 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 Meniscus, medial, derangement 12/29/2010  . CLOSED FRACTURE OF UPPER END OF TIBIA 08/12/2010    Plan: Patient will undergo surgical management with hysteroscopy , dilation and curettage with endometrial polyp removal (pt prefers a Tuesday). Scheduled for 05/11/16   .mec 05/03/2016 10:11 AM     By signing my name below, I, Hansel Feinstein, attest that this documentation has been prepared under the direction and in the presence of Jonnie Kind, MD. Electronically Signed: Hansel Feinstein, ED Scribe. 05/03/16. 10:11 AM.  I personally performed the services described in this documentation, which was SCRIBED in my presence. The recorded information has been reviewed and considered accurate. It has been edited as necessary during review. Jonnie Kind, MD       Electronically signed by Jonnie Kind, MD at 05/03/2016 11:00 AM

## 2016-05-03 NOTE — Patient Instructions (Signed)
polyp   Colleen Lee  05/03/2016     @PREFPERIOPPHARMACY @   Your procedure is scheduled on  05/11/2016   Report to [facility] at  950  A.M.  Call this number if you have problems the morning of surgery:  [phone]   Remember:  Do not eat food or drink liquids after midnight.  Take these medicines the morning of surgery with A SIP OF WATER  Norvasc, flexaril, nexium, hydrocodone, lisinopril, zofran, oxycodone. Use your inhaler before you come and bring it with you.   Do not wear jewelry, make-up or nail polish.  Do not wear lotions, powders, or perfumes, or deoderant.  Do not shave 48 hours prior to surgery.  Men may shave face and neck.  Do not bring valuables to the hospital.  [facility] is not responsible for any belongings or valuables.  Contacts, dentures or bridgework may not be worn into surgery.  Leave your suitcase in the car.  After surgery it may be brought to your room.  For patients admitted to the hospital, discharge time will be determined by your treatment team.  Patients discharged the day of surgery will not be allowed to drive home.   Name and phone number of your driver:   family Special instructions:  none  Please read over the following fact sheets that you were given. Anesthesia Post-op Instructions and Care and Recovery After Surgery      Hysteroscopy Hysteroscopy is a procedure used for looking inside the womb (uterus). It may be done for various reasons, including:  To evaluate abnormal bleeding, fibroid (benign, noncancerous) tumors, polyps, scar tissue (adhesions), and possibly cancer of the uterus.  To look for lumps (tumors) and other uterine growths.  To look for causes of why a woman cannot get pregnant (infertility), causes of recurrent loss of pregnancy (miscarriages), or a lost intrauterine device (IUD).  To perform a sterilization by blocking the fallopian tubes from inside the uterus. In this procedure, a  thin, flexible tube with a tiny light and camera on the end of it (hysteroscope) is used to look inside the uterus. A hysteroscopy should be done right after a menstrual period to be sure you are not pregnant. LET [facility] CARE PROVIDER KNOW ABOUT:   Any allergies you have.  All medicines you are taking, including vitamins, herbs, eye drops, creams, and over-the-counter medicines.  Previous problems you or members of your family have had with the use of anesthetics.  Any blood disorders you have.  Previous surgeries you have had.  Medical conditions you have. RISKS AND COMPLICATIONS  Generally, this is a safe procedure. However, as with any procedure, complications can occur. Possible complications include:  Putting a hole in the uterus.  Excessive bleeding.  Infection.  Damage to the cervix.  Injury to other organs.  Allergic reaction to medicines.  Too much fluid used in the uterus for the procedure. BEFORE THE PROCEDURE   Ask your health care provider about changing or stopping any regular medicines.  Do not take aspirin or blood thinners for 1 week before the procedure, or as directed by your health care provider. These can cause bleeding.  If you smoke, do not smoke for 2 weeks before the procedure.  In some cases, a medicine is placed in the cervix the day before the procedure. This medicine makes the cervix have a larger opening (dilate). This makes it easier for the instrument  to be inserted into the uterus during the procedure.  Do not eat or drink anything for at least 8 hours before the surgery.  Arrange for someone to take you home after the procedure. PROCEDURE   You may be given a medicine to relax you (sedative). You may also be given one of the following:  A medicine that numbs the area around the cervix (local anesthetic).  A medicine that makes you sleep through the procedure (general anesthetic).  The hysteroscope is inserted through the vagina  into the uterus. The camera on the hysteroscope sends a picture to a TV screen. This gives the surgeon a good view inside the uterus.  During the procedure, air or a liquid is put into the uterus, which allows the surgeon to see better.  Sometimes, tissue is gently scraped from inside the uterus. These tissue samples are sent to a lab for testing. AFTER THE PROCEDURE   If you had a general anesthetic, you may be groggy for a couple hours after the procedure.  If you had a local anesthetic, you will be able to go home as soon as you are stable and feel ready.  You may have some cramping. This normally lasts for a couple days.  You may have bleeding, which varies from light spotting for a few days to menstrual-like bleeding for 3-7 days. This is normal.  If your test results are not back during the visit, make an appointment with your health care provider to find out the results.   This information is not intended to replace advice given to you by your health care provider. Make sure you discuss any questions you have with your health care provider.   Document Released: 10/25/2000 Document Revised: 05/09/2013 Document Reviewed: 02/15/2013 Elsevier Interactive Patient Education 2016 [place]. Hysteroscopy, Care After Refer to this sheet in the next few weeks. These instructions provide you with information on caring for yourself after your procedure. Your health care provider may also give you more specific instructions. Your treatment has been planned according to current medical practices, but problems sometimes occur. Call your health care provider if you have any problems or questions after your procedure.  WHAT TO EXPECT AFTER THE PROCEDURE After your procedure, it is typical to have the following:  You may have some cramping. This normally lasts for a couple days.  You may have bleeding. This can vary from light spotting for a few days to menstrual-like bleeding for 3-7  days. HOME CARE INSTRUCTIONS  Rest for the first 1-2 days after the procedure.  Only take over-the-counter or prescription medicines as directed by your health care provider. Do not take aspirin. It can increase the chances of bleeding.  Take showers instead of baths for 2 weeks or as directed by your health care provider.  Do not drive for 24 hours or as directed.  Do not drink alcohol while taking pain medicine.  Do not use tampons, douche, or have sexual intercourse for 2 weeks or until your health care provider says it is okay.  Take your temperature twice a day for 4-5 days. Write it down each time.  Follow your health care provider's advice about diet, exercise, and lifting.  If you develop constipation, you may:  Take a mild laxative if your health care provider approves.  Add bran foods to your diet.  Drink enough fluids to keep your urine clear or pale yellow.  Try to have someone with you or available to you for the  first 24-48 hours, especially if you were given a general anesthetic.  Follow up with your health care provider as directed. SEEK MEDICAL CARE IF:  You feel dizzy or lightheaded.  You feel sick to your stomach (nauseous).  You have abnormal vaginal discharge.  You have a rash.  You have pain that is not controlled with medicine. SEEK IMMEDIATE MEDICAL CARE IF:  You have bleeding that is heavier than a normal menstrual period.  You have a fever.  You have increasing cramps or pain, not controlled with medicine.  You have new belly (abdominal) pain.  You pass out.  You have pain in the tops of your shoulders (shoulder strap areas).  You have shortness of breath.   This information is not intended to replace advice given to you by your health care provider. Make sure you discuss any questions you have with your health care provider.   Document Released: 05/09/2013 Document Reviewed: 05/09/2013 Elsevier Interactive Patient Education 2016  [employer] Dilation and Curettage or Vacuum Curettage Dilation and curettage (D&C) and vacuum curettage are minor procedures. A D&C involves stretching (dilation) the cervix and scraping (curettage) the inside lining of the womb (uterus). During a D&C, tissue is gently scraped from the inside lining of the uterus. During a vacuum curettage, the lining and tissue in the uterus are removed with the use of gentle suction.  Curettage may be performed to either diagnose or treat a problem. As a diagnostic procedure, curettage is performed to examine tissues from the uterus. A diagnostic curettage may be performed for the following symptoms:   Irregular bleeding in the uterus.   Bleeding with the development of clots.   Spotting between menstrual periods.   Prolonged menstrual periods.   Bleeding after menopause.   No menstrual period (amenorrhea).   A change in size and shape of the uterus.  As a treatment procedure, curettage may be performed for the following reasons:   Removal of an IUD (intrauterine device).   Removal of retained placenta after giving birth. Retained placenta can cause an infection or bleeding severe enough to require transfusions.   Abortion.   Miscarriage.   Removal of polyps inside the uterus.   Removal of uncommon types of noncancerous lumps (fibroids).  LET [facility] CARE PROVIDER KNOW ABOUT:   Any allergies you have.   All medicines you are taking, including vitamins, herbs, eye drops, creams, and over-the-counter medicines.   Previous problems you or members of your family have had with the use of anesthetics.   Any blood disorders you have.   Previous surgeries you have had.   Medical conditions you have. RISKS AND COMPLICATIONS  Generally, this is a safe procedure. However, as with any procedure, complications can occur. Possible complications include:  Excessive bleeding.   Infection of the uterus.   Damage to the  cervix.   Development of scar tissue (adhesions) inside the uterus, later causing abnormal amounts of menstrual bleeding.   Complications from the general anesthetic, if a general anesthetic is used.   Putting a hole (perforation) in the uterus. This is rare.  BEFORE THE PROCEDURE   Eat and drink before the procedure only as directed by your health care provider.   Arrange for someone to take you home.  PROCEDURE  This procedure usually takes about 15-30 minutes.  You will be given one of the following:  A medicine that numbs the area in and around the cervix (local anesthetic).   A medicine to make  you sleep through the procedure (general anesthetic).  You will lie on your back with your legs in stirrups.   A warm metal or plastic instrument (speculum) will be placed in your vagina to keep it open and to allow the health care provider to see the cervix.  There are two ways in which your cervix can be softened and dilated. These include:   Taking a medicine.   Having thin rods (laminaria) inserted into your cervix.   A curved tool (curette) will be used to scrape cells from the inside lining of the uterus. In some cases, gentle suction is applied with the curette. The curette will then be removed.  AFTER THE PROCEDURE   You will rest in the recovery area until you are stable and are ready to go home.   You may feel sick to your stomach (nauseous) or throw up (vomit) if you were given a general anesthetic.   You may have a sore throat if a tube was placed in your throat during general anesthesia.   You may have light cramping and bleeding. This may last for 2 days to 2 weeks after the procedure.   Your uterus needs to make a new lining after the procedure. This may make your next period late.   This information is not intended to replace advice given to you by your health care provider. Make sure you discuss any questions you have with your health care  provider.   Document Released: 07/19/2005 Document Revised: 03/21/2013 Document Reviewed: 02/15/2013 Elsevier Interactive Patient Education 2016 [employer]  Dilation and Curettage or Vacuum Curettage, Care After These instructions give you information on caring for yourself after your procedure. Your doctor may also give you more specific instructions. Call your doctor if you have any problems or questions after your procedure. HOME CARE  Do not drive for 24 hours.  Wait 1 week before doing any activities that wear you out.  Take your temperature 2 times a day for 4 days. Write it down. Tell your doctor if you have a fever.  Do not stand for a long time.  Do not lift, push, or pull anything over 10 pounds (4.5 kilograms).  Limit stair climbing to once or twice a day.  Rest often.  Continue with your usual diet.  Drink enough fluids to keep your pee (urine) clear or pale yellow.  If you have a hard time pooping (constipation), you may:  Take a medicine to help you go poop (laxative) as told by your doctor.  Eat more fruit and bran.  Drink more fluids.  Take showers, not baths, for as long as told by your doctor.  Do not swim or use a hot tub until your doctor says it is okay.  Have someone with you for 1-2 days after the procedure.  Do not douche, use tampons, or have sex (intercourse) for 2 weeks.  Only take medicines as told by your doctor. Do not take aspirin. It can cause bleeding.  Keep all doctor visits. GET HELP IF:  You have cramps or pain not helped by medicine.  You have new pain in the belly (abdomen).  You have a bad smelling fluid coming from your vagina.  You have a rash.  You have problems with any medicine. GET HELP RIGHT AWAY IF:   You start to bleed more than a regular period.  You have a fever.  You have chest pain.  You have trouble breathing.  You feel dizzy or  feel like passing out (fainting).  You pass out.  You have  pain in the tops of your shoulders.  You have vaginal bleeding with or without clumps of blood (blood clots). MAKE SURE YOU:  Understand these instructions.  Will watch your condition.  Will get help right away if you are not doing well or get worse.   This information is not intended to replace advice given to you by your health care provider. Make sure you discuss any questions you have with your health care provider.   Document Released: 04/27/2008 Document Revised: 07/24/2013 Document Reviewed: 02/15/2013 Elsevier Interactive Patient Education 2016 [place] Anesthesia, Adult General anesthesia is a sleep-like state of non-feeling produced by medicines (anesthetics). General anesthesia prevents you from being alert and feeling pain during a medical procedure. Your caregiver may recommend general anesthesia if your procedure:  Is long.  Is painful or uncomfortable.  Would be frightening to see or hear.  Requires you to be still.  Affects your breathing.  Causes significant blood loss. LET YOUR CAREGIVER KNOW ABOUT:  Allergies to food or medicine.  Medicines taken, including vitamins, herbs, eyedrops, over-the-counter medicines, and creams.  Use of steroids (by mouth or creams).  Previous problems with anesthetics or numbing medicines, including problems experienced by relatives.  History of bleeding problems or blood clots.  Previous surgeries and types of anesthetics received.  Possibility of pregnancy, if this applies.  Use of cigarettes, alcohol, or illegal drugs.  Any health condition(s), especially diabetes, sleep apnea, and high blood pressure. RISKS AND COMPLICATIONS General anesthesia rarely causes complications. However, if complications do occur, they can be life threatening. Complications include:  A lung infection.  A stroke.  A heart attack.  Waking up during the procedure. When this occurs, the patient may be unable to move and  communicate that he or she is awake. The patient may feel severe pain. Older adults and adults with serious medical problems are more likely to have complications than adults who are young and healthy. Some complications can be prevented by answering all of your caregiver's questions thoroughly and by following all pre-procedure instructions. It is important to tell your caregiver if any of the pre-procedure instructions, especially those related to diet, were not followed. Any food or liquid in the stomach can cause problems when you are under general anesthesia. BEFORE THE PROCEDURE  Ask your caregiver if you will have to spend the night at the hospital. If you will not have to spend the night, arrange to have an adult drive you and stay with you for 24 hours.  Follow your caregiver's instructions if you are taking dietary supplements or medicines. Your caregiver may tell you to stop taking them or to reduce your dosage.  Do not smoke for as long as possible before your procedure. If possible, stop smoking 3-6 weeks before the procedure.  Do not take new dietary supplements or medicines within 1 week of your procedure unless your caregiver approves them.  Do not eat within 8 hours of your procedure or as directed by your caregiver. Drink only clear liquids, such as water, black coffee (without milk or cream), and fruit juices (without pulp).  Do not drink within 3 hours of your procedure or as directed by your caregiver.  You may brush your teeth on the morning of the procedure, but make sure to spit out the toothpaste and water when finished. PROCEDURE  You will receive anesthetics through a mask, through an intravenous (IV) access  tube, or through both. A doctor who specializes in anesthesia (anesthesiologist) or a nurse who specializes in anesthesia (nurse anesthetist) or both will stay with you throughout the procedure to make sure you remain unconscious. He or she will also watch your blood  pressure, pulse, and oxygen levels to make sure that the anesthetics do not cause any problems. Once you are asleep, a breathing tube or mask may be used to help you breathe. AFTER THE PROCEDURE You will wake up after the procedure is complete. You may be in the room where the procedure was performed or in a recovery area. You may have a sore throat if a breathing tube was used. You may also feel:  Dizzy.  Weak.  Drowsy.  Confused.  Nauseous.  Cold. These are all normal responses and can be expected to last for up to 24 hours after the procedure is complete. A caregiver will tell you when you are ready to go home. This will usually be when you are fully awake and in stable condition.   This information is not intended to replace advice given to you by your health care provider. Make sure you discuss any questions you have with your health care provider.   Document Released: 10/26/2007 Document Revised: 08/09/2014 Document Reviewed: 11/17/2011 Elsevier Interactive Patient Education 2016 [place] Anesthesia, Adult, Care After Refer to this sheet in the next few weeks. These instructions provide you with information on caring for yourself after your procedure. Your health care provider may also give you more specific instructions. Your treatment has been planned according to current medical practices, but problems sometimes occur. Call your health care provider if you have any problems or questions after your procedure. WHAT TO EXPECT AFTER THE PROCEDURE After the procedure, it is typical to experience:  Sleepiness.  Nausea and vomiting. HOME CARE INSTRUCTIONS  For the first 24 hours after general anesthesia:  Have a responsible person with you.  Do not drive a car. If you are alone, do not take public transportation.  Do not drink alcohol.  Do not take medicine that has not been prescribed by your health care provider.  Do not sign important papers or make  important decisions.  You may resume a normal diet and activities as directed by your health care provider.  Change bandages (dressings) as directed.  If you have questions or problems that seem related to general anesthesia, call the hospital and ask for the anesthetist or anesthesiologist on call. SEEK MEDICAL CARE IF:  You have nausea and vomiting that continue the day after anesthesia.  You develop a rash. SEEK IMMEDIATE MEDICAL CARE IF:   You have difficulty breathing.  You have chest pain.  You have any allergic problems.   This information is not intended to replace advice given to you by your health care provider. Make sure you discuss any questions you have with your health care provider.   Document Released: 10/25/2000 Document Revised: 08/09/2014 Document Reviewed: 11/17/2011 Elsevier Interactive Patient Education [employer].

## 2016-05-03 NOTE — Progress Notes (Signed)
Patient ID: Colleen Lee, female   DOB: [birth year], 60 y.o.   MRN: [id number]  Chief Complaint  Patient presents with  . Pre-op Exam    Hysteroscopy and polyp removal     Preoperative History and Physical  Colleen Lee is a 60 y.o. No obstetric history on file. here for surgical management of uterine fibroids, endometrial polyp, PMB.    Endometrial biopsy on 03/31/16 showing endometrial polyp with progestin effect (no evidence of malignancy).   Pelvic US on 03/04/16 showed complex fluid filled endometrium w/a 1.4 x .8 x .7 cm echogenic mass w/a feeding vessel to mass.   Pt states her PMB has resolved; however her lower abdominal cramping has persisted. No significant preoperative concerns.  Discussed with pt risks and benefits of hysteroscopy and polyp removal. Discussed the likelihood that the procedure may become hysterectomy. At end of discussion, pt had opportunity to ask questions and has no further questions at this time.   Greater than 50% was spent in counseling and coordination of care with the patient. Total time greater than: 15 minutes   Proposed surgery: hysteroscopy and polyp removal   Past Medical History:  Diagnosis Date  . Asthma   . Chronic back pain   . Cirrhosis ([place])    Metavir score F4 on elastography 2015  . Fibroids   . Fibromyalgia   . GERD (gastroesophageal reflux disease)   . H. pylori infection 2014   treated with pylera, had to be treated again as it was not eradicated. Urea breath test then negative after subsequent treatment.   . [name]    HCV RNA positive 09/2012  . Hypertension   . PONV (postoperative nausea and vomiting)    pt thinks maybe once she had N&V  . Sciatica of left side    Past Surgical History:  Procedure Laterality Date  . COLONOSCOPY  01/18/2008   FBX:UXYBFX rectum.  Long redundant colon, a diminutive sigmoid polyp status post cold biopsy removed. Hyperplastic polyp. Repeat colonoscopy June 2014 due to family history of  colon cancer  . COLONOSCOPY WITH ESOPHAGOGASTRODUODENOSCOPY (EGD) N/A 11/02/2012   OVA:NVBTYOMA gastric mucosa of doubtful, +H.pylori. Incomplete colonoscopy due to patient unable to tolerate exam, proximal colon seen. Patient refused ACBE.  Marland Kitchen COLONOSCOPY WITH PROPOFOL N/A 03/08/2013   Dr. Gala Romney: colonic polyp-removed as scribed above. Internal Hemorrhoids. Pathology did not reveal any colonic tissue, only mucus. SURVEILLANCE DUE Aug 2019  . ESOPHAGOGASTRODUODENOSCOPY  01/18/2008   RMR: Normal esophagus, normal  stomach  . ESOPHAGOGASTRODUODENOSCOPY (EGD) WITH PROPOFOL N/A 02/09/2016   Procedure: ESOPHAGOGASTRODUODENOSCOPY (EGD) WITH PROPOFOL;  Surgeon: Daneil Dolin, MD;  [facility];  Service: Endoscopy;  Laterality: N/A;  730   . KNEE SURGERY     left knee  . MULTIPLE EXTRACTIONS WITH ALVEOLOPLASTY N/A 10/15/2013   Procedure: MULTIPLE EXTRACTION WITH ALVEOLOPLASTY;  Surgeon: Gae Bon, DDS;  Location: [facility];  Service: Oral Surgery;  Laterality: N/A;  . POLYPECTOMY N/A 03/08/2013   Procedure: POLYPECTOMY;  Surgeon: Daneil Dolin, MD;  Location: [facility];  Service: Endoscopy;  Laterality: N/A;  . TOTAL KNEE ARTHROPLASTY Left 02/05/2015   Procedure: LEFT TOTAL KNEE ARTHROPLASTY;  Surgeon: Carole Civil, MD;  Location: [facility];  Service: Orthopedics;  Laterality: Left;   OB History  Gravida Para Term Preterm AB Living            1  SAB TAB Ectopic Multiple Live Births  Patient denies any other pertinent gynecologic issues.   Current Outpatient Prescriptions on File Prior to Visit  Medication Sig Dispense Refill  . amLODipine (NORVASC) 10 MG tablet Take 1 tablet by mouth daily. Take along with 5 mg for a total of 15 mg.    . aspirin EC 325 MG EC tablet Take 1 tablet (325 mg total) by mouth 2 (two) times daily. 60 tablet 0  . budesonide-formoterol (SYMBICORT) 160-4.5 MCG/ACT inhaler Inhale 2 puffs into the lungs 2 (two) times daily.    . Cyanocobalamin (VITAMIN  B 12 PO) Take 1 tablet by mouth daily.    . cyclobenzaprine (FLEXERIL) 10 MG tablet Take 1 tablet (10 mg total) by mouth 3 (three) times daily. 90 tablet 5  . esomeprazole (NEXIUM) 40 MG capsule Take 1 capsule (40 mg total) by mouth every morning. 10 capsule 0  . HYDROcodone-acetaminophen (NORCO) 7.5-325 MG tablet Take 1 tablet by mouth every 8 (eight) hours as needed for moderate pain. 90 tablet 0  . ibuprofen (ADVIL,MOTRIN) 800 MG tablet Take 1 tablet (800 mg total) by mouth every 8 (eight) hours as needed. 90 tablet 5  . lisinopril (PRINIVIL,ZESTRIL) 20 MG tablet Take 1 tablet by mouth daily.    . megestrol (MEGACE) 40 MG tablet Take 40 mg by mouth daily.    . ondansetron (ZOFRAN ODT) 4 MG disintegrating tablet '4mg'$  ODT q4 hours prn nausea/vomit 12 tablet 0  . oxyCODONE-acetaminophen (PERCOCET/ROXICET) 5-325 MG tablet Take 1 tablet by mouth every 6 (six) hours as needed. 20 tablet 0   No current facility-administered medications on file prior to visit.    Allergies  Allergen Reactions  . Penicillins Rash    Has patient had a PCN reaction causing immediate rash, facial/tongue/throat swelling, SOB or lightheadedness with hypotension: Yes Has patient had a PCN reaction causing severe rash involving mucus membranes or skin necrosis: No Has patient had a PCN reaction that required hospitalization No Has patient had a PCN reaction occurring within the last 10 years: No If all of the above answers are "NO", then may proceed with Cephalosporin use.     Social History:   reports that she has been smoking Cigarettes.  She has a 40.00 pack-year smoking history. She has never used smokeless tobacco. She reports that she does not drink alcohol or use drugs.  Family History  Problem Relation Age of Onset  . Colon cancer Brother 85       . Multiple myeloma Brother   . Liver cancer Sister   . Prostate cancer Brother   . Pancreatic cancer Brother   . Cancer Mother     breast  . Asthma Mother      Review of Systems: Persistent lower abdominal cramping   PHYSICAL EXAM: Blood pressure 120/70, pulse 62, height '5\' 6"'$  (1.676 m), weight 173 lb 3.2 oz (78.6 kg). General appearance - alert, well appearing, and in no distress Chest - clear to auscultation, no wheezes, rales or rhonchi, symmetric air entry Heart - normal rate and regular rhythm Abdomen - soft, nontender, nondistended, well-healed cesarean scar, thick abdominal wall with no masses appreciated  Pelvic -  VULVA: normal appearing vulva with no masses, tenderness or lesions,  VAGINA: normal appearing vagina with normal color and discharge, no lesions,  CERVIX: no discharge or lesions, normal appearing cervix, small, no obvious polyp protrusion  UTERUS: uterus is normal size, shape, consistency and nontender, normal bimanual exam  ADNEXA: normal adnexa in size, nontender and no masses.  Extremities -  peripheral pulses normal, no pedal edema, no clubbing or cyanosis  Labs: No results found for this or any previous visit (from the past 336 hour(s)).  Imaging Studies: No results found.  Assessment: Patient Active Problem List   Diagnosis Date Noted  . Endometrial polyp 04/15/2016  . PMB (postmenopausal bleeding) 02/27/2016  . Alcoholic cirrhosis of liver without ascites ([place])   . Asthma 01/21/2016  . Hepatic cirrhosis ([place]) 09/22/2015  . Arthritis of knee, degenerative 02/05/2015  . History of Helicobacter pylori infection 10/30/2014  . Chronic [name] with cirrhosis ([place]) 04/11/2014  . De Quervain's disease (radial styloid tenosynovitis) 12/25/2013  . Anorexia 11/21/2012  . FH: colon cancer 11/21/2012  . Early satiety 10/25/2012  . Bowel habit changes 10/25/2012  . Abdominal pain, epigastric 10/25/2012  . Abdominal bloating 10/25/2012  . Constipation 10/25/2012  . Abnormal weight loss 10/25/2012  . Chronic viral [name] ([place]) 10/25/2012  . Radicular pain of left lower extremity 09/28/2012  . Back pain  09/28/2012  . Sciatica 08/10/2011  . S/P arthroscopy of left knee 08/10/2011  . Tibial plateau fracture 08/10/2011  . Pain in joint, lower leg 02/12/2011  . Stiffness of joint, not elsewhere classified, lower leg 02/12/2011  . 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 Meniscus, medial, derangement 12/29/2010  . CLOSED FRACTURE OF UPPER END OF TIBIA 08/12/2010    Plan: Patient will undergo surgical management with hysteroscopy and endometrial polyp removal (pt prefers a Tuesday).    .mec 05/03/2016 10:11 AM     By signing my name below, I, Hansel Feinstein, attest that this documentation has been prepared under the direction and in the presence of Jonnie Kind, MD. Electronically Signed: Hansel Feinstein, ED Scribe. 05/03/16. 10:11 AM.  I personally performed the services described in this documentation, which was SCRIBED in my presence. The recorded information has been reviewed and considered accurate. It has been edited as necessary during review. Jonnie Kind, MD

## 2016-05-04 ENCOUNTER — Encounter (HOSPITAL_COMMUNITY)
Admission: RE | Admit: 2016-05-04 | Discharge: 2016-05-04 | Disposition: A | Discharge status: Home / Self Care | Payer: Commercial Managed Care - HMO | Source: Ambulatory Visit | Attending: Obstetrics and Gynecology | Admitting: Obstetrics and Gynecology

## 2016-05-04 ENCOUNTER — Encounter (HOSPITAL_COMMUNITY): Payer: Self-pay

## 2016-05-04 DIAGNOSIS — Z01812 Encounter for preprocedural laboratory examination: Secondary | ICD-10-CM | POA: Insufficient documentation

## 2016-05-04 LAB — URINALYSIS, ROUTINE W REFLEX MICROSCOPIC
Bilirubin Urine: NEGATIVE
Glucose, UA: NEGATIVE mg/dL
Hgb urine dipstick: NEGATIVE
Ketones, ur: NEGATIVE mg/dL
Leukocytes, UA: NEGATIVE
Nitrite: NEGATIVE
Protein, ur: NEGATIVE mg/dL
Specific Gravity, Urine: 1.02 (ref 1.005–1.030)
pH: 5.5 (ref 5.0–8.0)

## 2016-05-04 LAB — CBC
HCT: 39.2 % (ref 36.0–46.0)
Hemoglobin: 13.2 g/dL (ref 12.0–15.0)
MCH: 30.9 pg (ref 26.0–34.0)
MCHC: 33.7 g/dL (ref 30.0–36.0)
MCV: 91.8 fL (ref 78.0–100.0)
Platelets: 178 10*3/uL (ref 150–400)
RBC: 4.27 MIL/uL (ref 3.87–5.11)
RDW: 15.5 % (ref 11.5–15.5)
WBC: 6.2 10*3/uL (ref 4.0–10.5)

## 2016-05-04 LAB — COMPREHENSIVE METABOLIC PANEL
ALT: 13 U/L — ABNORMAL LOW (ref 14–54)
AST: 15 U/L (ref 15–41)
Albumin: 4.3 g/dL (ref 3.5–5.0)
Alkaline Phosphatase: 60 U/L (ref 38–126)
Anion gap: 3 — ABNORMAL LOW (ref 5–15)
BUN: 10 mg/dL (ref 6–20)
CO2: 20 mmol/L — ABNORMAL LOW (ref 22–32)
Calcium: 9.3 mg/dL (ref 8.9–10.3)
Chloride: 113 mmol/L — ABNORMAL HIGH (ref 101–111)
Creatinine, Ser: 0.63 mg/dL (ref 0.44–1.00)
GFR calc Af Amer: >60 mL/min (ref >60)
GFR calc non Af Amer: >60 mL/min (ref >60)
Glucose, Bld: 93 mg/dL (ref 65–99)
Potassium: 3.6 mmol/L (ref 3.5–5.1)
Sodium: 136 mmol/L (ref 135–145)
Total Bilirubin: 0.7 mg/dL (ref 0.3–1.2)
Total Protein: 7.9 g/dL (ref 6.5–8.1)

## 2016-05-11 ENCOUNTER — Ambulatory Visit (HOSPITAL_COMMUNITY): Payer: Commercial Managed Care - HMO | Admitting: Anesthesiology

## 2016-05-11 ENCOUNTER — Encounter (HOSPITAL_COMMUNITY)
Admission: RE | Disposition: A | Discharge status: Home / Self Care | Payer: Self-pay | Source: Ambulatory Visit | Attending: Obstetrics and Gynecology

## 2016-05-11 ENCOUNTER — Encounter (HOSPITAL_COMMUNITY): Payer: Self-pay | Admitting: Anesthesiology

## 2016-05-11 ENCOUNTER — Ambulatory Visit (HOSPITAL_COMMUNITY)
Admission: RE | Admit: 2016-05-11 | Discharge: 2016-05-11 | Disposition: A | Discharge status: Home / Self Care | Payer: Commercial Managed Care - HMO | Source: Ambulatory Visit | Attending: Obstetrics and Gynecology | Admitting: Obstetrics and Gynecology

## 2016-05-11 DIAGNOSIS — J45909 Unspecified asthma, uncomplicated: Secondary | ICD-10-CM | POA: Insufficient documentation

## 2016-05-11 DIAGNOSIS — Z825 Family history of asthma and other chronic lower respiratory diseases: Secondary | ICD-10-CM | POA: Diagnosis not present

## 2016-05-11 DIAGNOSIS — K219 Gastro-esophageal reflux disease without esophagitis: Secondary | ICD-10-CM | POA: Insufficient documentation

## 2016-05-11 DIAGNOSIS — M549 Dorsalgia, unspecified: Secondary | ICD-10-CM | POA: Insufficient documentation

## 2016-05-11 DIAGNOSIS — G8929 Other chronic pain: Secondary | ICD-10-CM | POA: Diagnosis not present

## 2016-05-11 DIAGNOSIS — K7460 Unspecified cirrhosis of liver: Secondary | ICD-10-CM | POA: Insufficient documentation

## 2016-05-11 DIAGNOSIS — M654 Radial styloid tenosynovitis [de Quervain]: Secondary | ICD-10-CM | POA: Insufficient documentation

## 2016-05-11 DIAGNOSIS — Z7982 Long term (current) use of aspirin: Secondary | ICD-10-CM | POA: Insufficient documentation

## 2016-05-11 DIAGNOSIS — Z86010 Personal history of colonic polyps: Secondary | ICD-10-CM | POA: Insufficient documentation

## 2016-05-11 DIAGNOSIS — I10 Essential (primary) hypertension: Secondary | ICD-10-CM | POA: Diagnosis not present

## 2016-05-11 DIAGNOSIS — M5432 Sciatica, left side: Secondary | ICD-10-CM | POA: Insufficient documentation

## 2016-05-11 DIAGNOSIS — M797 Fibromyalgia: Secondary | ICD-10-CM | POA: Insufficient documentation

## 2016-05-11 DIAGNOSIS — Z800 Family history of malignant neoplasm of digestive organs: Secondary | ICD-10-CM | POA: Insufficient documentation

## 2016-05-11 DIAGNOSIS — Z880 Allergy status to penicillin: Secondary | ICD-10-CM | POA: Diagnosis not present

## 2016-05-11 DIAGNOSIS — N840 Polyp of corpus uteri: Secondary | ICD-10-CM | POA: Diagnosis not present

## 2016-05-11 DIAGNOSIS — Z79899 Other long term (current) drug therapy: Secondary | ICD-10-CM | POA: Diagnosis not present

## 2016-05-11 DIAGNOSIS — N950 Postmenopausal bleeding: Secondary | ICD-10-CM | POA: Insufficient documentation

## 2016-05-11 DIAGNOSIS — Z803 Family history of malignant neoplasm of breast: Secondary | ICD-10-CM | POA: Diagnosis not present

## 2016-05-11 DIAGNOSIS — Z8619 Personal history of other infectious and parasitic diseases: Secondary | ICD-10-CM | POA: Insufficient documentation

## 2016-05-11 DIAGNOSIS — F17210 Nicotine dependence, cigarettes, uncomplicated: Secondary | ICD-10-CM | POA: Diagnosis not present

## 2016-05-11 DIAGNOSIS — Z808 Family history of malignant neoplasm of other organs or systems: Secondary | ICD-10-CM | POA: Insufficient documentation

## 2016-05-11 DIAGNOSIS — Z96652 Presence of left artificial knee joint: Secondary | ICD-10-CM | POA: Insufficient documentation

## 2016-05-11 HISTORY — PX: POLYPECTOMY: SHX5525

## 2016-05-11 HISTORY — PX: HYSTEROSCOPY: SHX211

## 2016-05-11 SURGERY — POLYPECTOMY
Anesthesia: General

## 2016-05-11 MED ORDER — LACTATED RINGERS IV SOLN
INTRAVENOUS | Status: DC
  Administered 2016-05-11: 50 mL/h via INTRAVENOUS

## 2016-05-11 MED ORDER — PROPOFOL 10 MG/ML IV BOLUS
INTRAVENOUS | Status: AC
  Filled 2016-05-11: qty 20

## 2016-05-11 MED ORDER — FENTANYL CITRATE (PF) 100 MCG/2ML IJ SOLN
INTRAMUSCULAR | Status: AC
  Filled 2016-05-11: qty 2

## 2016-05-11 MED ORDER — MIDAZOLAM HCL 2 MG/2ML IJ SOLN: 1.0000 mg | INTRAMUSCULAR | 0 refills | Status: DC | PRN

## 2016-05-11 MED ORDER — ONDANSETRON HCL 4 MG/2ML IJ SOLN
INTRAMUSCULAR | Status: AC
  Filled 2016-05-11: qty 2

## 2016-05-11 MED ORDER — KETOROLAC TROMETHAMINE 10 MG PO TABS: 10.0000 mg | ORAL_TABLET | Freq: Four times a day (QID) | ORAL | 0 refills | Status: DC | PRN

## 2016-05-11 MED ORDER — LIDOCAINE HCL (PF) 1 % IJ SOLN
INTRAMUSCULAR | Status: AC
  Filled 2016-05-11: qty 5

## 2016-05-11 MED ORDER — ONDANSETRON HCL 4 MG/2ML IJ SOLN
4.0000 mg | Freq: Once | INTRAMUSCULAR | Status: AC
  Administered 2016-05-11: 4 mg via INTRAVENOUS

## 2016-05-11 MED ORDER — HYDROCODONE-ACETAMINOPHEN 5-325 MG PO TABS: 1.0000 | ORAL_TABLET | Freq: Four times a day (QID) | ORAL | 0 refills | Status: DC | PRN

## 2016-05-11 MED ORDER — MIDAZOLAM HCL 2 MG/2ML IJ SOLN
1.0000 mg | INTRAMUSCULAR | Status: DC | PRN
  Administered 2016-05-11 (×2): 2 mg via INTRAVENOUS
  Filled 2016-05-11: qty 2

## 2016-05-11 MED ORDER — LIDOCAINE HCL (CARDIAC) 10 MG/ML IV SOLN
INTRAVENOUS | Status: DC | PRN
  Administered 2016-05-11: 50 mg via INTRAVENOUS

## 2016-05-11 MED ORDER — KETOROLAC TROMETHAMINE 30 MG/ML IJ SOLN
INTRAMUSCULAR | Status: AC
  Filled 2016-05-11: qty 1

## 2016-05-11 MED ORDER — BUPIVACAINE-EPINEPHRINE (PF) 0.5% -1:200000 IJ SOLN
INTRAMUSCULAR | Status: AC
  Filled 2016-05-11: qty 30

## 2016-05-11 MED ORDER — LIDOCAINE HCL (PF) 1 % IJ SOLN
INTRAMUSCULAR | Status: AC
Start: 2016-05-11 — End: 2016-05-11
  Filled 2016-05-11: qty 2

## 2016-05-11 MED ORDER — FENTANYL CITRATE (PF) 100 MCG/2ML IJ SOLN
INTRAMUSCULAR | Status: DC | PRN
  Administered 2016-05-11 (×3): 50 ug via INTRAVENOUS
  Administered 2016-05-11: 25 ug via INTRAVENOUS
  Administered 2016-05-11: 50 ug via INTRAVENOUS
  Administered 2016-05-11: 25 ug via INTRAVENOUS

## 2016-05-11 MED ORDER — MIDAZOLAM HCL 2 MG/2ML IJ SOLN
INTRAMUSCULAR | Status: AC
  Filled 2016-05-11: qty 2

## 2016-05-11 MED ORDER — FENTANYL CITRATE (PF) 250 MCG/5ML IJ SOLN
INTRAMUSCULAR | Status: AC
  Filled 2016-05-11: qty 5

## 2016-05-11 MED ORDER — HYDROMORPHONE HCL 1 MG/ML IJ SOLN
0.2500 mg | INTRAMUSCULAR | Status: DC | PRN
  Administered 2016-05-11 (×2): 0.5 mg via INTRAVENOUS
  Filled 2016-05-11: qty 1

## 2016-05-11 MED ORDER — FENTANYL CITRATE (PF) 100 MCG/2ML IJ SOLN
25.0000 ug | INTRAMUSCULAR | Status: AC | PRN
  Administered 2016-05-11 (×2): 25 ug via INTRAVENOUS

## 2016-05-11 MED ORDER — PROPOFOL 10 MG/ML IV BOLUS
INTRAVENOUS | Status: DC | PRN
  Administered 2016-05-11: 40 mg via INTRAVENOUS
  Administered 2016-05-11: 160 mg via INTRAVENOUS

## 2016-05-11 MED ORDER — KETOROLAC TROMETHAMINE 30 MG/ML IJ SOLN
30.0000 mg | Freq: Once | INTRAMUSCULAR | Status: AC
  Administered 2016-05-11: 30 mg via INTRAVENOUS

## 2016-05-11 MED ORDER — GLYCOPYRROLATE 0.2 MG/ML IJ SOLN
0.2000 mg | Freq: Once | INTRAMUSCULAR | Status: AC | PRN
  Administered 2016-05-11: 0.2 mg via INTRAVENOUS

## 2016-05-11 MED ORDER — SODIUM CHLORIDE 0.9 % IR SOLN
Status: DC | PRN
  Administered 2016-05-11: 3000 mL

## 2016-05-11 MED ORDER — GLYCOPYRROLATE 0.2 MG/ML IJ SOLN
INTRAMUSCULAR | Status: AC
  Filled 2016-05-11: qty 1

## 2016-05-11 SURGICAL SUPPLY — 22 items
BAG HAMPER (MISCELLANEOUS) ×4 IMPLANT
CLOTH BEACON ORANGE TIMEOUT ST (SAFETY) ×4 IMPLANT
COVER LIGHT HANDLE STERIS (MISCELLANEOUS) ×8 IMPLANT
DRAPE PROXIMA HALF (DRAPES) ×4 IMPLANT
FORMALIN 10 PREFIL 120ML (MISCELLANEOUS) ×4 IMPLANT
GLOVE BIOGEL PI IND STRL 7.0 (GLOVE) ×4 IMPLANT
GLOVE BIOGEL PI IND STRL 9 (GLOVE) ×2 IMPLANT
GLOVE BIOGEL PI INDICATOR 7.0 (GLOVE) ×4
GLOVE BIOGEL PI INDICATOR 9 (GLOVE) ×2
GLOVE ECLIPSE 6.5 STRL STRAW (GLOVE) ×4 IMPLANT
GLOVE ECLIPSE 9.0 STRL (GLOVE) ×4 IMPLANT
GOWN SPEC L3 XXLG W/TWL (GOWN DISPOSABLE) ×4 IMPLANT
GOWN STRL REUS W/TWL LRG LVL3 (GOWN DISPOSABLE) ×4 IMPLANT
INST SET HYSTEROSCOPY (KITS) ×4 IMPLANT
IV NS IRRIG 3000ML ARTHROMATIC (IV SOLUTION) ×4 IMPLANT
KIT ROOM TURNOVER APOR (KITS) ×4 IMPLANT
MANIFOLD NEPTUNE II (INSTRUMENTS) ×4 IMPLANT
PACK PERI GYN (CUSTOM PROCEDURE TRAY) ×4 IMPLANT
PAD ARMBOARD 7.5X6 YLW CONV (MISCELLANEOUS) ×4 IMPLANT
PAD TELFA 3X4 1S STER (GAUZE/BANDAGES/DRESSINGS) ×4 IMPLANT
SET BASIN LINEN APH (SET/KITS/TRAYS/PACK) ×4 IMPLANT
SET CYSTO W/LG BORE CLAMP LF (SET/KITS/TRAYS/PACK) ×4 IMPLANT

## 2016-05-11 NOTE — Brief Op Note (Signed)
05/11/2016  12:35 PM  PATIENT:  Colleen Lee  60 y.o. female  PRE-OPERATIVE DIAGNOSIS:  postmenopausal bleeding;endometrial polyp  POST-OPERATIVE DIAGNOSIS:  postmenopausal bleeding;endometrial polyp  PROCEDURE:  Procedure(s): REMOVAL OF ENDOMETRIAL POLYP (N/A) HYSTEROSCOPY  SURGEON:  Surgeon(s) and Role:    * Jonnie Kind, MD - Primary  PHYSICIAN ASSISTANT:   ASSISTANTS: none   ANESTHESIA:   general  EBL:  No intake/output data recorded.  BLOOD ADMINISTERED:none  DRAINS: none   LOCAL MEDICATIONS USED:  NONE  SPECIMEN:  Source of Specimen:  Endometrial polyp  DISPOSITION OF SPECIMEN:  PATHOLOGY  COUNTS:  YES  TOURNIQUET:  * No tourniquets in log *  DICTATION: .Dragon Dictation  PLAN OF CARE: Discharge to home after PACU  PATIENT DISPOSITION:  PACU - hemodynamically stable.   Delay start of Pharmacological VTE agent (>24hrs) due to surgical blood loss or risk of bleeding: not applicable Details of procedure patient was taken operating room prepped and draped for vaginal procedure. Cervix was grasped with single-tooth tenaculum after timeout, and the uterus sounded to 7 cm dilated to 23 [place] allowing introduction of the 23 [place] 30 operative hysteroscope which identified a small area of polyp growing from the anterior fundal portion of the endometrial cavity. This was seated off at its surface using the    hysteroscopic scissors,. Randall stone forceps were then used to grasp the specimen. The remainder of the endometrial cavity was smooth. Tubal ostia could be visualized. Patient to recovery room in stable condition

## 2016-05-11 NOTE — Anesthesia Preprocedure Evaluation (Signed)
Anesthesia Evaluation  Patient identified by MRN, date of birth, ID band Patient awake    Reviewed: Allergy & Precautions, H&P , NPO status , [name] Chart, lab work & pertinent test results  History of Anesthesia Complications (+) PONV and history of anesthetic complications  Airway Mallampati: II  TM Distance: >3 FB     Dental  (+) Poor Dentition, Missing   Pulmonary asthma , COPD, Current Smoker,    breath sounds clear to auscultation       Cardiovascular hypertension, Pt. on medications  Rhythm:Regular Rate:Normal     Neuro/Psych  Neuromuscular disease    GI/Hepatic GERD  Medicated and Controlled,(+) Cirrhosis     substance abuse (remission 37yrs )  alcohol use, Hepatitis -, C  Endo/Other    Renal/GU      Musculoskeletal  (+) Fibromyalgia -  Abdominal   Peds  Hematology   Anesthesia Other Findings   Reproductive/Obstetrics                             Anesthesia Physical Anesthesia Plan  ASA: III  Anesthesia Plan: General   Post-op Pain Management:    Induction: Intravenous  Airway Management Planned: LMA  Additional Equipment:   Intra-op Plan:   Post-operative Plan: Extubation in OR  Informed Consent: I have reviewed the patients History and Physical, chart, labs and discussed the procedure including the risks, benefits and alternatives for the proposed anesthesia with the patient or authorized representative who has indicated his/her understanding and acceptance.     Plan Discussed with:   Anesthesia Plan Comments:         Anesthesia Quick Evaluation

## 2016-05-11 NOTE — Anesthesia Postprocedure Evaluation (Signed)
Anesthesia Post Note  Patient: Colleen Lee  Procedure(s) Performed: Procedure(s) (LRB): REMOVAL OF ENDOMETRIAL POLYP (N/A) HYSTEROSCOPY  Patient location during evaluation: PACU Anesthesia Type: General Level of consciousness: awake Pain management: satisfactory to patient Vital Signs Assessment: post-procedure vital signs reviewed and stable Respiratory status: spontaneous breathing Cardiovascular status: stable Anesthetic complications: no    Last Vitals:  Vitals:   05/11/16 1200 05/11/16 1242  BP: 126/76 121/71  Pulse:  70  Resp: 20 18  Temp:  36.6 C    Last Pain:  Vitals:   05/11/16 1300  TempSrc:   PainSc: Asleep                 Halimah Bewick

## 2016-05-11 NOTE — Transfer of Care (Signed)
Immediate Anesthesia Transfer of Care Note  Patient: Colleen Lee  Procedure(s) Performed: Procedure(s): REMOVAL OF ENDOMETRIAL POLYP (N/A) HYSTEROSCOPY  Patient Location: PACU  Anesthesia Type:General  Level of Consciousness: awake and alert   Airway & Oxygen Therapy: Patient Spontanous Breathing and Patient connected to face mask oxygen  Post-op Assessment: Report given to RN  Post vital signs: Reviewed and stable  Last Vitals:  Vitals:   05/11/16 1155 05/11/16 1200  BP: 113/72 126/76  Pulse:    Resp: 19 20  Temp:      Last Pain:  Vitals:   05/11/16 1015  TempSrc:   PainSc: 0-No pain         Complications: No apparent anesthesia complications

## 2016-05-11 NOTE — H&P (Signed)
Preoperative History and Physical  Colleen Lee is a 59 y.o. No obstetric history on file. here for surgical management of uterine fibroids, endometrial polyp, PMB.    Endometrial biopsy on 03/31/16 showing endometrial polyp with progestin effect (no evidence of malignancy).   Pelvic US on 03/04/16 showed complex fluid filled endometrium w/a 1.4 x .8 x .7 cm echogenic mass w/a feeding vessel to mass.   Pt states her PMB has resolved; however her lower abdominal cramping has persisted. No significant preoperative concerns.  Discussed with pt risks and benefits of hysteroscopy and polyp removal. Discussed the likelihood that the procedure may become hysterectomy. At end of discussion, pt had opportunity to ask questions and has no further questions at this time.   Greater than 50% was spent in counseling and coordination of care with the patient. Total time greater than: 15 minutes   Proposed surgery: hysteroscopy and polyp removal       Past Medical History:  Diagnosis Date  . Asthma   . Chronic back pain   . Cirrhosis ([facility])    Metavir score F4 on elastography 2015  . Fibroids   . Fibromyalgia   . GERD (gastroesophageal reflux disease)   . H. pylori infection 2014   treated with pylera, had to be treated again as it was not eradicated. Urea breath test then negative after subsequent treatment.   . [name]    HCV RNA positive 09/2012  . Hypertension   . PONV (postoperative nausea and vomiting)    pt thinks maybe once she had N&V  . Sciatica of left side         Past Surgical History:  Procedure Laterality Date  . COLONOSCOPY  01/18/2008   RMR:normal rectum.  Long redundant colon, a diminutive sigmoid polyp status post cold biopsy removed. Hyperplastic polyp. Repeat colonoscopy June 2014 due to family history of colon cancer  . COLONOSCOPY WITH ESOPHAGOGASTRODUODENOSCOPY (EGD) N/A 11/02/2012   RMR:Abnormal gastric mucosa of doubtful, +H.pylori.  Incomplete colonoscopy due to patient unable to tolerate exam, proximal colon seen. Patient refused ACBE.  . COLONOSCOPY WITH PROPOFOL N/A 03/08/2013   Dr. Rourk: colonic polyp-removed as scribed above. Internal Hemorrhoids. Pathology did not reveal any colonic tissue, only mucus. SURVEILLANCE DUE Aug 2019  . ESOPHAGOGASTRODUODENOSCOPY  01/18/2008   RMR: Normal esophagus, normal  stomach  . ESOPHAGOGASTRODUODENOSCOPY (EGD) WITH PROPOFOL N/A 02/09/2016   Procedure: ESOPHAGOGASTRODUODENOSCOPY (EGD) WITH PROPOFOL;  Surgeon: Robert M Rourk, MD;  [facility];  Service: Endoscopy;  Laterality: N/A;  730  . KNEE SURGERY     left knee  . MULTIPLE EXTRACTIONS WITH ALVEOLOPLASTY N/A 10/15/2013   Procedure: MULTIPLE EXTRACTION WITH ALVEOLOPLASTY;  Surgeon: Scott M Jensen, DDS;  Location: [facility];  Service: Oral Surgery;  Laterality: N/A;  . POLYPECTOMY N/A 03/08/2013   Procedure: POLYPECTOMY;  Surgeon: Robert M Rourk, MD;  Location: [facility];  Service: Endoscopy;  Laterality: N/A;  . TOTAL KNEE ARTHROPLASTY Left 02/05/2015   Procedure: LEFT TOTAL KNEE ARTHROPLASTY;  Surgeon: Stanley E Harrison, MD;  Location: [facility];  Service: Orthopedics;  Laterality: Left;          OB History  Gravida Para Term Preterm AB Living            1  SAB TAB Ectopic Multiple Live Births                 Patient denies any other pertinent gynecologic issues.           Current Outpatient Prescriptions on File Prior to Visit  Medication Sig Dispense Refill  . amLODipine (NORVASC) 10 MG tablet Take 1 tablet by mouth daily. Take along with 5 mg for a total of 15 mg.    . aspirin EC 325 MG EC tablet Take 1 tablet (325 mg total) by mouth 2 (two) times daily. 60 tablet 0  . budesonide-formoterol (SYMBICORT) 160-4.5 MCG/ACT inhaler Inhale 2 puffs into the lungs 2 (two) times daily.    . Cyanocobalamin (VITAMIN B 12 PO) Take 1 tablet by mouth daily.    . cyclobenzaprine (FLEXERIL) 10 MG tablet Take 1 tablet  (10 mg total) by mouth 3 (three) times daily. 90 tablet 5  . esomeprazole (NEXIUM) 40 MG capsule Take 1 capsule (40 mg total) by mouth every morning. 10 capsule 0  . HYDROcodone-acetaminophen (NORCO) 7.5-325 MG tablet Take 1 tablet by mouth every 8 (eight) hours as needed for moderate pain. 90 tablet 0  . ibuprofen (ADVIL,MOTRIN) 800 MG tablet Take 1 tablet (800 mg total) by mouth every 8 (eight) hours as needed. 90 tablet 5  . lisinopril (PRINIVIL,ZESTRIL) 20 MG tablet Take 1 tablet by mouth daily.    . megestrol (MEGACE) 40 MG tablet Take 40 mg by mouth daily.    . ondansetron (ZOFRAN ODT) 4 MG disintegrating tablet 68m ODT q4 hours prn nausea/vomit 12 tablet 0  . oxyCODONE-acetaminophen (PERCOCET/ROXICET) 5-325 MG tablet Take 1 tablet by mouth every 6 (six) hours as needed. 20 tablet 0   No current facility-administered medications on file prior to visit.         Allergies  Allergen Reactions  . Penicillins Rash    Has patient had a PCN reaction causing immediate rash, facial/tongue/throat swelling, SOB or lightheadedness with hypotension: Yes Has patient had a PCN reaction causing severe rash involving mucus membranes or skin necrosis: No Has patient had a PCN reaction that required hospitalization No Has patient had a PCN reaction occurring within the last 10 years: No If all of the above answers are "NO", then may proceed with Cephalosporin use.    Social History:   reports that she has been smoking Cigarettes.  She has a 40.00 pack-year smoking history. She has never used smokeless tobacco. She reports that she does not drink alcohol or use drugs.        Family History  Problem Relation Age of Onset  . Colon cancer Brother 5102      . Multiple myeloma Brother   . Liver cancer Sister   . Prostate cancer Brother   . Pancreatic cancer Brother   . Cancer Mother     breast  . Asthma Mother     Review of Systems: Persistent lower abdominal cramping    PHYSICAL EXAM: Blood pressure 120/70, pulse 62, height 5' 6" (1.676 m), weight 173 lb 3.2 oz (78.6 kg). General appearance - alert, well appearing, and in no distress Chest - clear to auscultation, no wheezes, rales or rhonchi, symmetric air entry Heart - normal rate and regular rhythm Abdomen - soft, nontender, nondistended, well-healed cesarean scar, thick abdominal wall with no masses appreciated  Pelvic -  VULVA: normal appearing vulva with no masses, tenderness or lesions,  VAGINA: normal appearing vagina with normal color and discharge, no lesions,  CERVIX: no discharge or lesions, normal appearing cervix, small, no obvious polyp protrusion  UTERUS: uterus is normal size, shape, consistency and nontender, normal bimanual exam  ADNEXA: normal adnexa in size, nontender and no masses.  Extremities - peripheral pulses normal, no pedal edema, no clubbing or cyanosis  Labs: CBC    Component Value Date/Time   WBC 6.2 05/04/2016 0822   RBC 4.27 05/04/2016 0822   HGB 13.2 05/04/2016 0822   HCT 39.2 05/04/2016 0822   PLT 178 05/04/2016 0822   MCV 91.8 05/04/2016 0822   MCH 30.9 05/04/2016 0822   MCHC 33.7 05/04/2016 0822   RDW 15.5 05/04/2016 0822   LYMPHSABS 1.4 03/27/2016 1257   MONOABS 0.4 03/27/2016 1257   EOSABS 0.2 03/27/2016 1257   BASOSABS 0.0 03/27/2016 1257   CMP Latest Ref Rng & Units 05/04/2016 03/27/2016 02/02/2016  Glucose 65 - 99 mg/dL 93 100(H) 101(H)  BUN 6 - 20 mg/dL 10 7 7  Creatinine 0.44 - 1.00 mg/dL 0.63 0.64 0.65  Sodium 135 - 145 mmol/L 136 134(L) 136  Potassium 3.5 - 5.1 mmol/L 3.6 4.0 3.6  Chloride 101 - 111 mmol/L 113(H) 110 109  CO2 22 - 32 mmol/L 20(L) 22 18(L)  Calcium 8.9 - 10.3 mg/dL 9.3 9.4 9.6  Total Protein 6.5 - 8.1 g/dL 7.9 - 6.8  Total Bilirubin 0.3 - 1.2 mg/dL 0.7 - 0.7  Alkaline Phos 38 - 126 U/L 60 - 59  AST 15 - 41 U/L 15 - 12  ALT 14 - 54 U/L 13(L) - 8      Imaging Studies: ImagingResults  No results found.     Assessment:     Patient Active Problem List   Diagnosis Date Noted  . Endometrial polyp 04/15/2016  . PMB (postmenopausal bleeding) 02/27/2016  . Alcoholic cirrhosis of liver without ascites ([facility])   . Asthma 01/21/2016  . Hepatic cirrhosis ([facility]) 09/22/2015  . Arthritis of knee, degenerative 02/05/2015  . History of Helicobacter pylori infection 10/30/2014  . Chronic [name] with cirrhosis ([facility]) 04/11/2014  . De Quervain's disease (radial styloid tenosynovitis) 12/25/2013  . Anorexia 11/21/2012  . FH: colon cancer 11/21/2012  . Early satiety 10/25/2012  . Bowel habit changes 10/25/2012  . Abdominal pain, epigastric 10/25/2012  . Abdominal bloating 10/25/2012  . Constipation 10/25/2012  . Abnormal weight loss 10/25/2012  . Chronic viral [name] ([facility]) 10/25/2012  . Radicular pain of left lower extremity 09/28/2012  . Back pain 09/28/2012  . Sciatica 08/10/2011  . S/P arthroscopy of left knee 08/10/2011  . Tibial plateau fracture 08/10/2011  . Pain in joint, lower leg 02/12/2011  . Stiffness of joint, not elsewhere classified, lower leg 02/12/2011  . 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 Meniscus, medial, derangement 12/29/2010  . CLOSED FRACTURE OF UPPER END OF TIBIA 08/12/2010    Plan: Patient will undergo surgical management with hysteroscopy and endometrial polyp removal (pt prefers a Tuesday).    .mec 05/03/2016 10:11 AM     By signing my name below, I, Eva Mathews, attest that this documentation has been prepared under the direction and in the presence of  V , MD. Electronically Signed: Eva Mathews, ED Scribe. 05/03/16. 10:11 AM.  I personally performed the services described in this documentation, which was SCRIBED in my presence. The recorded information has been reviewed and considered accurate. It has been edited as necessary during review. , V, MD       Electronically signed by  V , MD at 05/03/2016  11:00 AM     

## 2016-05-11 NOTE — Discharge Instructions (Signed)
Hysteroscopy Hysteroscopy is a procedure used for looking inside the womb (uterus). It may be done for various reasons, including:  To evaluate abnormal bleeding, fibroid (benign, noncancerous) tumors, polyps, scar tissue (adhesions), and possibly cancer of the uterus.  To look for lumps (tumors) and other uterine growths.  To look for causes of why a woman cannot get pregnant (infertility), causes of recurrent loss of pregnancy (miscarriages), or a lost intrauterine device (IUD).  To perform a sterilization by blocking the fallopian tubes from inside the uterus. In this procedure, a thin, flexible tube with a tiny light and camera on the end of it (hysteroscope) is used to look inside the uterus. A hysteroscopy should be done right after a menstrual period to be sure you are not pregnant. LET [facility] CARE PROVIDER KNOW ABOUT:   Any allergies you have.  All medicines you are taking, including vitamins, herbs, eye drops, creams, and over-the-counter medicines.  Previous problems you or members of your family have had with the use of anesthetics.  Any blood disorders you have.  Previous surgeries you have had.  Medical conditions you have. RISKS AND COMPLICATIONS  Generally, this is a safe procedure. However, as with any procedure, complications can occur. Possible complications include:  Putting a hole in the uterus.  Excessive bleeding.  Infection.  Damage to the cervix.  Injury to other organs.  Allergic reaction to medicines.  Too much fluid used in the uterus for the procedure. BEFORE THE PROCEDURE   Ask your health care provider about changing or stopping any regular medicines.  Do not take aspirin or blood thinners for 1 week before the procedure, or as directed by your health care provider. These can cause bleeding.  If you smoke, do not smoke for 2 weeks before the procedure.  In some cases, a medicine is placed in the cervix the day before the procedure.  This medicine makes the cervix have a larger opening (dilate). This makes it easier for the instrument to be inserted into the uterus during the procedure.  Do not eat or drink anything for at least 8 hours before the surgery.  Arrange for someone to take you home after the procedure. PROCEDURE   You may be given a medicine to relax you (sedative). You may also be given one of the following:  A medicine that numbs the area around the cervix (local anesthetic).  A medicine that makes you sleep through the procedure (general anesthetic).  The hysteroscope is inserted through the vagina into the uterus. The camera on the hysteroscope sends a picture to a TV screen. This gives the surgeon a good view inside the uterus.  During the procedure, air or a liquid is put into the uterus, which allows the surgeon to see better.  Sometimes, tissue is gently scraped from inside the uterus. These tissue samples are sent to a lab for testing. AFTER THE PROCEDURE   If you had a general anesthetic, you may be groggy for a couple hours after the procedure.  If you had a local anesthetic, you will be able to go home as soon as you are stable and feel ready.  You may have some cramping. This normally lasts for a couple days.  You may have bleeding, which varies from light spotting for a few days to menstrual-like bleeding for 3-7 days. This is normal.  If your test results are not back during the visit, make an appointment with your health care provider to find out the  results.   This information is not intended to replace advice given to you by your health care provider. Make sure you discuss any questions you have with your health care provider.   Document Released: 10/25/2000 Document Revised: 05/09/2013 Document Reviewed: 02/15/2013 Elsevier Interactive Patient Education [employer].

## 2016-05-11 NOTE — Anesthesia Procedure Notes (Signed)
Procedure Name: LMA Insertion Date/Time: 05/11/2016 12:10 PM Performed by: Tressie Stalker E Pre-anesthesia Checklist: Patient identified, Patient being monitored, Emergency Drugs available, Timeout performed and Suction available Patient Re-evaluated:Patient Re-evaluated prior to inductionOxygen Delivery Method: Circle System Utilized Preoxygenation: Pre-oxygenation with 100% oxygen Intubation Type: IV induction Ventilation: Mask ventilation without difficulty LMA: LMA inserted LMA Size: 4.0 Number of attempts: 1 Placement Confirmation: positive ETCO2 and breath sounds checked- equal and bilateral

## 2016-05-11 NOTE — Op Note (Signed)
Please see brief operative note for surgical details

## 2016-05-14 ENCOUNTER — Encounter (HOSPITAL_COMMUNITY): Payer: Self-pay | Admitting: Obstetrics and Gynecology

## 2016-05-20 ENCOUNTER — Other Ambulatory Visit (HOSPITAL_COMMUNITY): Payer: Self-pay | Admitting: Respiratory Therapy

## 2016-05-20 DIAGNOSIS — J441 Chronic obstructive pulmonary disease with (acute) exacerbation: Secondary | ICD-10-CM

## 2016-05-20 DIAGNOSIS — J449 Chronic obstructive pulmonary disease, unspecified: Secondary | ICD-10-CM | POA: Diagnosis not present

## 2016-05-20 DIAGNOSIS — I959 Hypotension, unspecified: Secondary | ICD-10-CM | POA: Diagnosis not present

## 2016-05-20 DIAGNOSIS — R5383 Other fatigue: Secondary | ICD-10-CM | POA: Diagnosis not present

## 2016-05-21 ENCOUNTER — Ambulatory Visit (INDEPENDENT_AMBULATORY_CARE_PROVIDER_SITE_OTHER): Payer: Commercial Managed Care - HMO | Admitting: Obstetrics and Gynecology

## 2016-05-21 ENCOUNTER — Encounter: Payer: Self-pay | Admitting: Obstetrics and Gynecology

## 2016-05-21 VITALS — BP 104/50 | HR 76 | Ht 66.0 in | Wt 176.6 lb

## 2016-05-21 DIAGNOSIS — N840 Polyp of corpus uteri: Secondary | ICD-10-CM | POA: Diagnosis not present

## 2016-05-21 DIAGNOSIS — Z09 Encounter for follow-up examination after completed treatment for conditions other than malignant neoplasm: Secondary | ICD-10-CM | POA: Diagnosis not present

## 2016-05-21 NOTE — Progress Notes (Signed)
   Subjective:  Colleen Lee is a 60 y.o. female now 10 days status post hysteroscopy and polyp removal. She has no c/o  Review of Systems Negative  Objective:  BP (!) 104/50   Pulse 76   Ht 5\' 6"  (1.676 m)   Wt 176 lb 9.6 oz (80.1 kg)   BMI 28.50 kg/m  General:Well developed, well nourished.  No acute distress.  Incision(s):   Healing, no drainage, no erythema, no hernia, no swelling, no dehiscence,     Assessment:  Post-Op 10 days s/p hysteroscopy and polyp removal. Pathology report reviewed with pt--benign endometrial polyp.  Pt doing well postoperatively; no bleeding, no pain .   Plan:  1.Wound care discussed   2. Toradol; will switch to ibuprofen  3. Follow up PRN.  By signing my name below, I, Evelene Croon, attest that this documentation has been prepared under the direction and in the presence of Jonnie Kind, MD . Electronically Signed: Evelene Croon, Scribe. 05/21/2016. 9:25 AM. I personally performed the services described in this documentation, which was SCRIBED in my presence. The recorded information has been reviewed and considered accurate. It has been edited as necessary during review. Jonnie Kind, MD

## 2016-05-24 ENCOUNTER — Telehealth: Payer: Self-pay | Admitting: Orthopedic Surgery

## 2016-05-24 ENCOUNTER — Other Ambulatory Visit: Payer: Self-pay | Admitting: *Deleted

## 2016-05-24 MED ORDER — HYDROCODONE-ACETAMINOPHEN 7.5-325 MG PO TABS: 1.0000 | ORAL_TABLET | Freq: Three times a day (TID) | ORAL | 0 refills | Status: DC | PRN

## 2016-05-24 NOTE — Telephone Encounter (Signed)
yes

## 2016-05-24 NOTE — Telephone Encounter (Signed)
Hydrocodone-Acetaminophen 5/325mg  Qty 15 Tablets  Take 1 tablet by mouth every 6 (six) hours as needed for moderate pain. May take with ibuprofen

## 2016-05-24 NOTE — Telephone Encounter (Signed)
ROUTING TO DR HARRISON FOR APPROVAL 

## 2016-05-25 DIAGNOSIS — M25562 Pain in left knee: Secondary | ICD-10-CM | POA: Diagnosis not present

## 2016-05-25 DIAGNOSIS — K219 Gastro-esophageal reflux disease without esophagitis: Secondary | ICD-10-CM | POA: Diagnosis not present

## 2016-05-25 DIAGNOSIS — E784 Other hyperlipidemia: Secondary | ICD-10-CM | POA: Diagnosis not present

## 2016-05-25 DIAGNOSIS — B182 Chronic viral hepatitis C: Secondary | ICD-10-CM | POA: Diagnosis not present

## 2016-05-25 DIAGNOSIS — J449 Chronic obstructive pulmonary disease, unspecified: Secondary | ICD-10-CM | POA: Diagnosis not present

## 2016-05-25 DIAGNOSIS — Z6831 Body mass index (BMI) 31.0-31.9, adult: Secondary | ICD-10-CM | POA: Diagnosis not present

## 2016-05-25 DIAGNOSIS — I10 Essential (primary) hypertension: Secondary | ICD-10-CM | POA: Diagnosis not present

## 2016-05-25 DIAGNOSIS — M549 Dorsalgia, unspecified: Secondary | ICD-10-CM | POA: Diagnosis not present

## 2016-05-25 DIAGNOSIS — F17200 Nicotine dependence, unspecified, uncomplicated: Secondary | ICD-10-CM | POA: Diagnosis not present

## 2016-05-26 ENCOUNTER — Ambulatory Visit (HOSPITAL_COMMUNITY)
Admission: RE | Admit: 2016-05-26 | Discharge: 2016-05-26 | Disposition: A | Discharge status: Home / Self Care | Payer: Commercial Managed Care - HMO | Source: Ambulatory Visit | Attending: Internal Medicine | Admitting: Internal Medicine

## 2016-05-26 DIAGNOSIS — J441 Chronic obstructive pulmonary disease with (acute) exacerbation: Secondary | ICD-10-CM | POA: Diagnosis not present

## 2016-05-26 DIAGNOSIS — J988 Other specified respiratory disorders: Secondary | ICD-10-CM | POA: Diagnosis not present

## 2016-05-26 LAB — SPIROMETRY WITH GRAPH
FEF 25-75 Pre: 1.16 L/sec
FEF2575-%Pred-Pre: 53 %
FEV1-%Pred-Pre: 74 %
FEV1-Pre: 1.68 L
FEV1FVC-%Pred-Pre: 92 %
FEV6-%Pred-Pre: 83 %
FEV6-Pre: 2.31 L
FEV6FVC-%Pred-Pre: 103 %
FVC-%Pred-Pre: 80 %
FVC-Pre: 2.31 L
Pre FEV1/FVC ratio: 73 %
Pre FEV6/FVC Ratio: 100 %

## 2016-06-08 ENCOUNTER — Encounter (HOSPITAL_COMMUNITY): Payer: Self-pay | Admitting: Emergency Medicine

## 2016-06-08 ENCOUNTER — Emergency Department (HOSPITAL_COMMUNITY): Payer: Commercial Managed Care - HMO

## 2016-06-08 ENCOUNTER — Observation Stay (HOSPITAL_COMMUNITY)
Admission: EM | Admit: 2016-06-08 | Discharge: 2016-06-09 | Disposition: A | Discharge status: Home / Self Care | Payer: Commercial Managed Care - HMO | Source: Home / Self Care | Attending: Emergency Medicine | Admitting: Emergency Medicine

## 2016-06-08 DIAGNOSIS — K2100 Gastro-esophageal reflux disease with esophagitis, without bleeding: Secondary | ICD-10-CM

## 2016-06-08 DIAGNOSIS — Z7982 Long term (current) use of aspirin: Secondary | ICD-10-CM

## 2016-06-08 DIAGNOSIS — J45909 Unspecified asthma, uncomplicated: Secondary | ICD-10-CM | POA: Insufficient documentation

## 2016-06-08 DIAGNOSIS — A09 Infectious gastroenteritis and colitis, unspecified: Secondary | ICD-10-CM | POA: Diagnosis not present

## 2016-06-08 DIAGNOSIS — R197 Diarrhea, unspecified: Secondary | ICD-10-CM | POA: Diagnosis present

## 2016-06-08 DIAGNOSIS — Z79899 Other long term (current) drug therapy: Secondary | ICD-10-CM | POA: Insufficient documentation

## 2016-06-08 DIAGNOSIS — I10 Essential (primary) hypertension: Secondary | ICD-10-CM | POA: Diagnosis present

## 2016-06-08 DIAGNOSIS — F17210 Nicotine dependence, cigarettes, uncomplicated: Secondary | ICD-10-CM | POA: Insufficient documentation

## 2016-06-08 DIAGNOSIS — K219 Gastro-esophageal reflux disease without esophagitis: Secondary | ICD-10-CM | POA: Diagnosis present

## 2016-06-08 DIAGNOSIS — R1084 Generalized abdominal pain: Secondary | ICD-10-CM | POA: Insufficient documentation

## 2016-06-08 DIAGNOSIS — N852 Hypertrophy of uterus: Secondary | ICD-10-CM | POA: Diagnosis present

## 2016-06-08 DIAGNOSIS — R112 Nausea with vomiting, unspecified: Secondary | ICD-10-CM | POA: Diagnosis not present

## 2016-06-08 DIAGNOSIS — R109 Unspecified abdominal pain: Secondary | ICD-10-CM

## 2016-06-08 DIAGNOSIS — J4540 Moderate persistent asthma, uncomplicated: Secondary | ICD-10-CM | POA: Diagnosis not present

## 2016-06-08 DIAGNOSIS — F418 Other specified anxiety disorders: Secondary | ICD-10-CM | POA: Diagnosis present

## 2016-06-08 LAB — COMPREHENSIVE METABOLIC PANEL
ALT: 16 U/L (ref 14–54)
AST: 18 U/L (ref 15–41)
Albumin: 4.8 g/dL (ref 3.5–5.0)
Alkaline Phosphatase: 66 U/L (ref 38–126)
Anion gap: 10 (ref 5–15)
BUN: 6 mg/dL (ref 6–20)
CO2: 19 mmol/L — ABNORMAL LOW (ref 22–32)
Calcium: 10.3 mg/dL (ref 8.9–10.3)
Chloride: 106 mmol/L (ref 101–111)
Creatinine, Ser: 0.84 mg/dL (ref 0.44–1.00)
GFR calc Af Amer: >60 mL/min (ref >60)
GFR calc non Af Amer: >60 mL/min (ref >60)
Glucose, Bld: 117 mg/dL — ABNORMAL HIGH (ref 65–99)
Potassium: 3.7 mmol/L (ref 3.5–5.1)
Sodium: 135 mmol/L (ref 135–145)
Total Bilirubin: 1.2 mg/dL (ref 0.3–1.2)
Total Protein: 8.9 g/dL — ABNORMAL HIGH (ref 6.5–8.1)

## 2016-06-08 LAB — URINALYSIS, ROUTINE W REFLEX MICROSCOPIC
Bilirubin Urine: NEGATIVE
Glucose, UA: NEGATIVE mg/dL
Ketones, ur: 15 mg/dL — AB
Leukocytes, UA: NEGATIVE
Nitrite: NEGATIVE
Protein, ur: 100 mg/dL — AB
Specific Gravity, Urine: 1.025 (ref 1.005–1.030)
pH: 6 (ref 5.0–8.0)

## 2016-06-08 LAB — CBC
HCT: 43 % (ref 36.0–46.0)
Hemoglobin: 14.9 g/dL (ref 12.0–15.0)
MCH: 31.8 pg (ref 26.0–34.0)
MCHC: 34.7 g/dL (ref 30.0–36.0)
MCV: 91.7 fL (ref 78.0–100.0)
Platelets: 208 10*3/uL (ref 150–400)
RBC: 4.69 MIL/uL (ref 3.87–5.11)
RDW: 14.8 % (ref 11.5–15.5)
WBC: 8.6 10*3/uL (ref 4.0–10.5)

## 2016-06-08 LAB — URINE MICROSCOPIC-ADD ON

## 2016-06-08 LAB — LACTIC ACID, PLASMA: Lactic Acid, Venous: 1.9 mmol/L (ref 0.5–1.9)

## 2016-06-08 LAB — LIPASE, BLOOD: Lipase: 19 U/L (ref 11–51)

## 2016-06-08 MED ORDER — MOMETASONE FURO-FORMOTEROL FUM 200-5 MCG/ACT IN AERO
2.0000 | INHALATION_SPRAY | Freq: Two times a day (BID) | RESPIRATORY_TRACT | Status: DC
  Filled 2016-06-08: qty 8.8

## 2016-06-08 MED ORDER — IOPAMIDOL (ISOVUE-300) INJECTION 61%
INTRAVENOUS | Status: AC
  Filled 2016-06-08: qty 30

## 2016-06-08 MED ORDER — HYDRALAZINE HCL 20 MG/ML IJ SOLN: 10.0000 mg | INTRAMUSCULAR | Status: DC | PRN

## 2016-06-08 MED ORDER — SODIUM CHLORIDE 0.9 % IV SOLN
INTRAVENOUS | Status: DC
  Administered 2016-06-08: 19:00:00 via INTRAVENOUS

## 2016-06-08 MED ORDER — MORPHINE SULFATE (PF) 4 MG/ML IV SOLN
4.0000 mg | INTRAVENOUS | Status: AC | PRN
  Administered 2016-06-08 (×2): 4 mg via INTRAVENOUS
  Filled 2016-06-08 (×2): qty 1

## 2016-06-08 MED ORDER — LISINOPRIL 10 MG PO TABS
20.0000 mg | ORAL_TABLET | Freq: Every day | ORAL | Status: DC
  Administered 2016-06-09: 20 mg via ORAL
  Filled 2016-06-08: qty 2

## 2016-06-08 MED ORDER — ASPIRIN EC 325 MG PO TBEC
325.0000 mg | DELAYED_RELEASE_TABLET | Freq: Two times a day (BID) | ORAL | Status: DC
  Administered 2016-06-09: 325 mg via ORAL
  Filled 2016-06-08: qty 1

## 2016-06-08 MED ORDER — PANTOPRAZOLE SODIUM 40 MG PO TBEC
40.0000 mg | DELAYED_RELEASE_TABLET | Freq: Every day | ORAL | Status: DC
  Administered 2016-06-09: 40 mg via ORAL
  Filled 2016-06-08: qty 1

## 2016-06-08 MED ORDER — ACETAMINOPHEN 650 MG RE SUPP: 650.0000 mg | Freq: Four times a day (QID) | RECTAL | Status: DC | PRN

## 2016-06-08 MED ORDER — FAMOTIDINE IN NACL 20-0.9 MG/50ML-% IV SOLN
20.0000 mg | Freq: Two times a day (BID) | INTRAVENOUS | Status: DC
  Filled 2016-06-08: qty 50

## 2016-06-08 MED ORDER — IOPAMIDOL (ISOVUE-300) INJECTION 61%
100.0000 mL | Freq: Once | INTRAVENOUS | Status: AC | PRN
  Administered 2016-06-08: 100 mL via INTRAVENOUS

## 2016-06-08 MED ORDER — HYDROCODONE-ACETAMINOPHEN 7.5-325 MG PO TABS
1.0000 | ORAL_TABLET | Freq: Three times a day (TID) | ORAL | Status: DC | PRN
  Administered 2016-06-09: 1 via ORAL
  Filled 2016-06-08: qty 1

## 2016-06-08 MED ORDER — HYDROMORPHONE HCL 1 MG/ML IJ SOLN
0.5000 mg | INTRAMUSCULAR | Status: DC | PRN
  Administered 2016-06-08 – 2016-06-09 (×4): 0.5 mg via INTRAVENOUS
  Filled 2016-06-08 (×4): qty 0.5

## 2016-06-08 MED ORDER — ENOXAPARIN SODIUM 40 MG/0.4ML ~~LOC~~ SOLN
40.0000 mg | SUBCUTANEOUS | Status: DC
  Administered 2016-06-08: 40 mg via SUBCUTANEOUS
  Filled 2016-06-08: qty 0.4

## 2016-06-08 MED ORDER — AMLODIPINE BESYLATE 5 MG PO TABS
10.0000 mg | ORAL_TABLET | Freq: Every day | ORAL | Status: DC
  Administered 2016-06-09: 10 mg via ORAL
  Filled 2016-06-08: qty 2

## 2016-06-08 MED ORDER — ONDANSETRON HCL 4 MG/2ML IJ SOLN
4.0000 mg | INTRAMUSCULAR | Status: AC | PRN
Start: 2016-06-08 — End: 2016-06-08
  Administered 2016-06-08 (×2): 4 mg via INTRAVENOUS
  Filled 2016-06-08 (×2): qty 2

## 2016-06-08 MED ORDER — LIDOCAINE HCL (PF) 1 % IJ SOLN
INTRAMUSCULAR | Status: AC
Start: 2016-06-08 — End: 2016-06-08
  Administered 2016-06-08: 0.5 mL
  Filled 2016-06-08: qty 5

## 2016-06-08 MED ORDER — ONDANSETRON HCL 4 MG PO TABS: 4.0000 mg | ORAL_TABLET | Freq: Four times a day (QID) | ORAL | Status: DC | PRN

## 2016-06-08 MED ORDER — SODIUM CHLORIDE 0.9 % IV SOLN
INTRAVENOUS | Status: AC
  Administered 2016-06-08 – 2016-06-09 (×2): via INTRAVENOUS

## 2016-06-08 MED ORDER — ACETAMINOPHEN 325 MG PO TABS: 650.0000 mg | ORAL_TABLET | Freq: Four times a day (QID) | ORAL | Status: DC | PRN

## 2016-06-08 MED ORDER — CYCLOBENZAPRINE HCL 10 MG PO TABS
10.0000 mg | ORAL_TABLET | Freq: Three times a day (TID) | ORAL | Status: DC
  Administered 2016-06-08 – 2016-06-09 (×2): 10 mg via ORAL
  Filled 2016-06-08 (×2): qty 1

## 2016-06-08 MED ORDER — ONDANSETRON HCL 4 MG/2ML IJ SOLN: 4.0000 mg | INTRAMUSCULAR | Status: DC | PRN

## 2016-06-08 MED ORDER — ONDANSETRON HCL 4 MG/2ML IJ SOLN
4.0000 mg | Freq: Four times a day (QID) | INTRAMUSCULAR | Status: DC | PRN
  Administered 2016-06-09: 4 mg via INTRAVENOUS
  Filled 2016-06-08: qty 2

## 2016-06-08 MED ORDER — ONDANSETRON HCL 4 MG/2ML IJ SOLN
4.0000 mg | Freq: Once | INTRAMUSCULAR | Status: AC | PRN
  Administered 2016-06-08: 4 mg via INTRAVENOUS
  Filled 2016-06-08: qty 2

## 2016-06-08 MED ORDER — FAMOTIDINE IN NACL 20-0.9 MG/50ML-% IV SOLN
20.0000 mg | Freq: Once | INTRAVENOUS | Status: AC
  Administered 2016-06-08: 20 mg via INTRAVENOUS
  Filled 2016-06-08: qty 50

## 2016-06-08 MED ORDER — MORPHINE SULFATE (PF) 4 MG/ML IV SOLN
4.0000 mg | INTRAVENOUS | Status: DC | PRN
  Administered 2016-06-08: 4 mg via INTRAVENOUS
  Filled 2016-06-08: qty 1

## 2016-06-08 MED ORDER — MEGESTROL ACETATE 40 MG PO TABS
40.0000 mg | ORAL_TABLET | Freq: Every day | ORAL | Status: DC
  Administered 2016-06-09: 40 mg via ORAL
  Filled 2016-06-08 (×3): qty 1

## 2016-06-08 NOTE — H&P (Signed)
History and Physical    Colleen Lee XBJ:[id number] DOB: 1956/01/08 DOA: 06/08/2016  PCP: Rosita Fire, MD   Patient coming from: Home  Chief Complaint: Fever, chills, nausea, vomiting, diarrhea  HPI: Colleen Lee is a 60 y.o. female with medical history significant for chronic [name] with cirrhosis, persistent asthma, GERD, and hypertension who presents to the ED with 1 day of generalized abdominal pain with nausea, vomiting, and diarrhea. Patient reports that she had been in her usual state of health until developing some mild nausea and subjective fever and chills late last night. She woke this morning with severe nausea and developed nonbloody non-bilious vomiting. This persisted throughout the day and became accompanied by watery diarrhea. Patient denies any recent travel or sick contacts, though notes that her husband has now developed the same symptoms tonight and has begun vomiting. Patient denies any hematemesis, melena, or hematochezia. She describes abdominal pain as diffuse and mild. She denies chest pain or palpitations and denies dysuria, flank pain, dyspnea, or cough. She has not been on antibiotics recently.  ED Course: Upon arrival to the ED, patient is found to be afebrile, saturating well on room air, hypertensive to the 185/80 range, and with vitals otherwise stable. Chest x-ray is negative for acute cardiopulmonary disease and CMP is largely unremarkable. CBC is entirely within normal limits and urinalysis is notable only for moderate hemoglobin and presence of ketones. CT of the abdomen and pelvis was obtained and demonstrates some possible colonic wall thickening versus underdistention as well as a prominent uterus with features that prompt recommendation for nonemergent pelvic ultrasound as appropriate. Patient was treated with multiple doses of IV Zofran and morphine. She was treated with IV fluids and IV Pepcid. She continued to vomit and has been unable to tolerate  any oral intake. She does remain hemodynamically stable and in no apparent respiratory distress. She'll be observed on the medical-surgical unit for ongoing evaluation and management of intractable nausea and vomiting with watery diarrhea suspicious for acute viral gastroenteritis.  Review of Systems:  All other systems reviewed and apart from HPI, are negative.  Past Medical History:  Diagnosis Date  . Asthma   . Chronic back pain   . Cirrhosis ([place])    Metavir score F4 on elastography 2015  . Fibroids   . Fibromyalgia   . GERD (gastroesophageal reflux disease)   . H. pylori infection 2014   treated with pylera, had to be treated again as it was not eradicated. Urea breath test then negative after subsequent treatment.   . [name]    HCV RNA positive 09/2012  . Hypertension   . PONV (postoperative nausea and vomiting)    pt thinks maybe once she had N&V  . Sciatica of left side     Past Surgical History:  Procedure Laterality Date  . COLONOSCOPY  01/18/2008   HYQ:MVHQIO rectum.  Long redundant colon, a diminutive sigmoid polyp status post cold biopsy removed. Hyperplastic polyp. Repeat colonoscopy June 2014 due to family history of colon cancer  . COLONOSCOPY WITH ESOPHAGOGASTRODUODENOSCOPY (EGD) N/A 11/02/2012   NGE:XBMWUXLK gastric mucosa of doubtful, +H.pylori. Incomplete colonoscopy due to patient unable to tolerate exam, proximal colon seen. Patient refused ACBE.  Marland Kitchen COLONOSCOPY WITH PROPOFOL N/A 03/08/2013   Dr. Gala Romney: colonic polyp-removed as scribed above. Internal Hemorrhoids. Pathology did not reveal any colonic tissue, only mucus. SURVEILLANCE DUE Aug 2019  . ESOPHAGOGASTRODUODENOSCOPY  01/18/2008   RMR: Normal esophagus, normal  stomach  . ESOPHAGOGASTRODUODENOSCOPY (EGD) WITH  PROPOFOL N/A 02/09/2016   Procedure: ESOPHAGOGASTRODUODENOSCOPY (EGD) WITH PROPOFOL;  Surgeon: Daneil Dolin, MD;  Location[facility];  Service: Endoscopy;  Laterality: N/A;  730   .  HYSTEROSCOPY  05/11/2016   Procedure: HYSTEROSCOPY;  Surgeon: Jonnie Kind, MD;  Location: [facility];  Service: Gynecology;;  . KNEE SURGERY     left knee  . MULTIPLE EXTRACTIONS WITH ALVEOLOPLASTY N/A 10/15/2013   Procedure: MULTIPLE EXTRACTION WITH ALVEOLOPLASTY;  Surgeon: Gae Bon, DDS;  Location: [place];  Service: Oral Surgery;  Laterality: N/A;  . POLYPECTOMY N/A 03/08/2013   Procedure: POLYPECTOMY;  Surgeon: Daneil Dolin, MD;  Location: [facility];  Service: Endoscopy;  Laterality: N/A;  . POLYPECTOMY N/A 05/11/2016   Procedure: REMOVAL OF ENDOMETRIAL POLYP;  Surgeon: Jonnie Kind, MD;  Location: [facility];  Service: Gynecology;  Laterality: N/A;  . TOTAL KNEE ARTHROPLASTY Left 02/05/2015   Procedure: LEFT TOTAL KNEE ARTHROPLASTY;  Surgeon: Carole Civil, MD;  Location: [facility];  Service: Orthopedics;  Laterality: Left;     reports that she has been smoking Cigarettes.  She has a 40.00 pack-year smoking history. She has never used smokeless tobacco. She reports that she does not drink alcohol or use drugs.  Allergies  Allergen Reactions  . Penicillins Rash    Has patient had a PCN reaction causing immediate rash, facial/tongue/throat swelling, SOB or lightheadedness with hypotension: Yes Has patient had a PCN reaction causing severe rash involving mucus membranes or skin necrosis: No Has patient had a PCN reaction that required hospitalization No Has patient had a PCN reaction occurring within the last 10 years: No If all of the above answers are "NO", then may proceed with Cephalosporin use.     Family History  Problem Relation Age of Onset  . Colon cancer Brother 75       . Multiple myeloma Brother   . Liver cancer Sister   . Prostate cancer Brother   . Pancreatic cancer Brother   . Cancer Mother     breast  . Asthma Mother      Prior to Admission medications   Medication Sig Start Date End Date Taking? Authorizing Provider  amLODipine (NORVASC) 10 MG tablet Take  1 tablet by mouth daily. Take along with 5 mg for a total of 15 mg. 08/07/15   Historical Provider, MD  aspirin EC 325 MG EC tablet Take 1 tablet (325 mg total) by mouth 2 (two) times daily. 02/08/15   Carole Civil, MD  budesonide-formoterol [facility]) 160-4.5 MCG/ACT inhaler Inhale 2 puffs into the lungs 2 (two) times daily.    Historical Provider, MD  Cyanocobalamin (VITAMIN B 12 PO) Take 1 tablet by mouth daily.    Historical Provider, MD  cyclobenzaprine (FLEXERIL) 10 MG tablet Take 1 tablet (10 mg total) by mouth 3 (three) times daily. 02/24/16   Carole Civil, MD  esomeprazole (NEXIUM) 40 MG capsule Take 1 capsule (40 mg total) by mouth every morning. 12/19/14   Annitta Needs, NP  HYDROcodone-acetaminophen (NORCO) 7.5-325 MG tablet Take 1 tablet by mouth every 8 (eight) hours as needed for moderate pain. 05/24/16   Carole Civil, MD  HYDROcodone-acetaminophen (NORCO/VICODIN) 5-325 MG tablet Take 1 tablet by mouth every 6 (six) hours as needed for moderate pain. May take with ibuprofen Patient not taking: Reported on 05/21/2016 05/11/16   Jonnie Kind, MD  ibuprofen (ADVIL,MOTRIN) 800 MG tablet Take 1 tablet (800 mg total) by mouth every 8 (  eight) hours as needed. 03/26/16   Carole Civil, MD  ketorolac (TORADOL) 10 MG tablet Take 1 tablet (10 mg total) by mouth every 6 (six) hours as needed (five day limit postop). 05/11/16   Jonnie Kind, MD  lisinopril (PRINIVIL,ZESTRIL) 20 MG tablet Take 1 tablet by mouth daily. 09/25/15   Historical Provider, MD  megestrol (MEGACE) 40 MG tablet Take 40 mg by mouth daily.    Historical Provider, MD  ondansetron (ZOFRAN ODT) 4 MG disintegrating tablet '4mg'$  ODT q4 hours prn nausea/vomit 11/24/15   Milton Ferguson, MD  oxyCODONE-acetaminophen (PERCOCET/ROXICET) 5-325 MG tablet Take 1 tablet by mouth every 6 (six) hours as needed. Patient not taking: Reported on 05/21/2016 11/24/15   Milton Ferguson, MD    Physical Exam: Vitals:   06/08/16 1534  06/08/16 1945 06/08/16 1946 06/08/16 2228  BP: 185/83 166/80 166/80 191/92  Pulse: 70  75 68  Resp: '16  20 18  '$ Temp: 97.9 F (36.6 C)     TempSrc: Oral     SpO2: 100%  100% 98%  Weight: 79.8 kg (176 lb)     Height: '5\' 6"'$  (1.676 m)         Constitutional: NAD, calm, appears uncomfortable Eyes: PERTLA, lids and conjunctivae normal ENMT: Mucous membranes are dry. Posterior pharynx clear of any exudate or lesions.   Neck: normal, supple, no masses, no thyromegaly Respiratory: clear to auscultation bilaterally, no wheezing, no crackles. Normal respiratory effort.   Cardiovascular: S1 & S2 heard, regular rate and rhythm, soft systolic murmur at apex. No significant JVD. Abdomen: No distension, mild tenderness throughout, no rebound pain or guarding, no masses palpated. Bowel sounds normal.  Musculoskeletal: no clubbing / cyanosis. No joint deformity upper and lower extremities. Normal muscle tone.  Skin: no significant rashes, lesions, ulcers. Warm, dry, well-perfused. Neurologic: CN 2-12 grossly intact. Sensation intact, DTR normal. Strength 5/5 in all 4 limbs.  Psychiatric: Normal judgment and insight. Alert and oriented x 3. Normal mood and affect.     Labs on Admission: I have personally reviewed following labs and imaging studies  CBC:  Recent Labs Lab 06/08/16 1853  WBC 8.6  HGB 14.9  HCT 43.0  MCV 91.7  PLT 366   Basic Metabolic Panel:  Recent Labs Lab 06/08/16 1853  NA 135  K 3.7  CL 106  CO2 19*  GLUCOSE 117*  BUN 6  CREATININE 0.84  CALCIUM 10.3   GFR: Estimated Creatinine Clearance: 75.9 mL/min (by C-G formula based on SCr of 0.84 mg/dL). Liver Function Tests:  Recent Labs Lab 06/08/16 1853  AST 18  ALT 16  ALKPHOS 66  BILITOT 1.2  PROT 8.9*  ALBUMIN 4.8    Recent Labs Lab 06/08/16 1853  LIPASE 19   No results for input(s): AMMONIA in the last 168 hours. Coagulation Profile: No results for input(s): INR, PROTIME in the last 168  hours. Cardiac Enzymes: No results for input(s): CKTOTAL, CKMB, CKMBINDEX, TROPONINI in the last 168 hours. BNP (last 3 results) No results for input(s): PROBNP in the last 8760 hours. HbA1C: No results for input(s): HGBA1C in the last 72 hours. CBG: No results for input(s): GLUCAP in the last 168 hours. Lipid Profile: No results for input(s): CHOL, HDL, LDLCALC, TRIG, CHOLHDL, LDLDIRECT in the last 72 hours. Thyroid Function Tests: No results for input(s): TSH, T4TOTAL, FREET4, T3FREE, THYROIDAB in the last 72 hours. Anemia Panel: No results for input(s): VITAMINB12, FOLATE, FERRITIN, TIBC, IRON, RETICCTPCT in the last 72 hours.  Urine analysis:    Component Value Date/Time   COLORURINE YELLOW 06/08/2016 1830   APPEARANCEUR CLEAR 06/08/2016 1830   LABSPEC 1.025 06/08/2016 1830   PHURINE 6.0 06/08/2016 1830   GLUCOSEU NEGATIVE 06/08/2016 1830   HGBUR MODERATE (A) 06/08/2016 1830   BILIRUBINUR NEGATIVE 06/08/2016 1830   KETONESUR 15 (A) 06/08/2016 1830   PROTEINUR 100 (A) 06/08/2016 1830   UROBILINOGEN 0.2 04/01/2015 1140   NITRITE NEGATIVE 06/08/2016 1830   LEUKOCYTESUR NEGATIVE 06/08/2016 1830   Sepsis Labs: '@LABRCNTIP'$ (procalcitonin:4,lacticidven:4) )No results found for this or any previous visit (from the past 240 hour(s)).   Radiological Exams on Admission: Dg Chest 2 View  Result Date: 06/08/2016 CLINICAL DATA:  60 y/o F; abdominal pain, nausea, vomiting, and diarrhea. EXAM: CHEST  2 VIEW COMPARISON:  03/27/2016 chest radiograph FINDINGS: Stable cardiac silhouette given patient rotation and technique. No focal consolidation. No pneumothorax or effusion. Mild degenerative changes of thoracic spine. IMPRESSION: No active cardiopulmonary disease. Electronically Signed   By: Kristine Garbe M.D.   On: 06/08/2016 19:51   Ct Abdomen Pelvis W Contrast  Result Date: 06/08/2016 CLINICAL DATA:  Nausea vomiting and diarrhea with abdominal pain EXAM: CT ABDOMEN AND PELVIS  WITH CONTRAST TECHNIQUE: Multidetector CT imaging of the abdomen and pelvis was performed using the standard protocol following bolus administration of intravenous contrast. CONTRAST:  134m ISOVUE-300 IOPAMIDOL (ISOVUE-300) INJECTION 61% COMPARISON:  11/24/2015 FINDINGS: Lower chest: Limited by mild respiratory motion artifact. No acute infiltrate or effusion. Heart size upper normal. No gross pericardial effusion at the lung bases. Hepatobiliary: No intra hepatic biliary dilatation. No focal hepatic abnormality. No calcified gallstones. Slightly enlarged extrahepatic common bile duct up to 8 mm, no definite calcified stones seen. Pancreas: No inflammatory changes. Mildly prominent proximal pancreatic duct as before. Spleen: Normal in size without focal abnormality. Adrenals/Urinary Tract: Mild thickening of the left adrenal gland without mass. Tiny sub cm hypodense lesions in the bilateral kidneys. These are likely stable. Bladder unremarkable. Stomach/Bowel: The stomach is nonenlarged. No small bowel dilatation. Questionable mild diffuse wall thickening of the colon but without significant inflammation in the adjacent fat. The appendix is visualized and is normal. Vascular/Lymphatic: Atherosclerotic vascular calcifications. No significantly enlarged abdominal or pelvic lymph nodes. Reproductive: Prominent uterus with calcified masses/presumed fibroids. Hypodense fluid or possible thickened endometrial stripe within the uterus. No adnexal masses. Other: No free air or free fluid. Small fatty containing umbilical hernia. Musculoskeletal: No acute osseous abnormality. IMPRESSION: 1. Questionable colon wall thickening versus underdistention, no significant inflammation in the fat surrounding the colon. Normal appendix. No evidence for bowel obstruction. 2. Stable slightly enlarged extrahepatic common bile duct and prominent pancreatic duct. 3. Prominent uterus with calcified masses/fibroids. Possible small amount of  hypodense fluid within the uterine cavity burst is mildly thickened endometrium. Nonemergent pelvic ultrasound may be obtained for further evaluation. Electronically Signed   By: KDonavan FoilM.D.   On: 06/08/2016 21:19    EKG: Not performed, will obtain as appropriate.   Assessment/Plan  1. Acute viral gastroenteritis  - Pt presents with 1 day of mild crampy abd pain with nausea, vomiting, and watery diarrhea with subjective fevers/chills  - Her husband has developed the same symptoms  - Labs and imaging are reassuring; suspect this is secondary to viral AGE  - Given her intolerance of oral intake despite aggressive treatment with anti-emetics in ED, will observe in hospital for IVF hydration  - Check GI pathogen panel; infection-control measures with enteric precautions - Repeat chem panel in  am    2. Intractable nausea and vomiting - Likely secondary to acute viral GE as above  - Continue IVF hydration and antiemetics; trial liquid diet and advance as tolerated  - Repeat chem panel in am   3. Hypertension  - Elevated currently, likely secondary to N/V  - Continue lisinopril and Norvasc in the am as tolerated  - Monitor and treat prn with hydralazine IVPs for now    4. GERD - EGD in July 2017 with portal hypertensive gastropathy, no varices or esophagitis  - Managed with Nexium at home; continue PPI as tolerated; started on IV Pepcid for now given current intolerance of oral intake   5. Asthma, persistent - Stable on admission with no wheezing or respiratory distress  - Continue daily ICS/LABA and prn albuterol    DVT prophylaxis: sq Lovenox Code Status: Full  Family Communication: Discussed with patient Disposition Plan: Observe on med-surg Consults called: None Admission status: Observation    Vianne Bulls, MD [facility] Pager [phone]  If 7PM-7AM, please contact night-coverage [email or web address] Password [email or web address]  06/08/2016, 11:04 PM

## 2016-06-08 NOTE — ED Triage Notes (Signed)
PT states n/v/d with abdominal pain that started this am.

## 2016-06-08 NOTE — ED Provider Notes (Signed)
[facility] DEPT Provider Note   CSN: [id number] Arrival date & time: 06/08/16  1506     History   Chief Complaint Chief Complaint  Patient presents with  . Abdominal Pain    HPI Colleen Lee is a 60 y.o. female.  HPI  Pt was seen at [place].  Per pt, c/o gradual onset and persistence of constant generalized abd "pain" since this morning.  Has been associated with multiple intermittent episodes of N/V/D.  Describes the abd pain as "aching." Has been associated with generalized weakness.  Denies fevers, no back pain, no rash, no CP/SOB, no black or blood in stools or emesis.      Past Medical History:  Diagnosis Date  . Asthma   . Chronic back pain   . Cirrhosis ([place])    Metavir score F4 on elastography 2015  . Fibroids   . Fibromyalgia   . GERD (gastroesophageal reflux disease)   . H. pylori infection 2014   treated with pylera, had to be treated again as it was not eradicated. Urea breath test then negative after subsequent treatment.   . [name]    HCV RNA positive 09/2012  . Hypertension   . PONV (postoperative nausea and vomiting)    pt thinks maybe once she had N&V  . Sciatica of left side     Patient Active Problem List   Diagnosis Date Noted  . Endometrial polyp 04/15/2016  . PMB (postmenopausal bleeding) 02/27/2016  . Alcoholic cirrhosis of liver without ascites ([place])   . Asthma 01/21/2016  . Hepatic cirrhosis ([place]) 09/22/2015  . Arthritis of knee, degenerative 02/05/2015  . History of Helicobacter pylori infection 10/30/2014  . Chronic [name] with cirrhosis ([place]) 04/11/2014  . De Quervain's disease (radial styloid tenosynovitis) 12/25/2013  . Anorexia 11/21/2012  . FH: colon cancer 11/21/2012  . Early satiety 10/25/2012  . Bowel habit changes 10/25/2012  . Abdominal pain, epigastric 10/25/2012  . Abdominal bloating 10/25/2012  . Constipation 10/25/2012  . Abnormal weight loss 10/25/2012  . Chronic viral [name] ([place]) 10/25/2012  .  Radicular pain of left lower extremity 09/28/2012  . Back pain 09/28/2012  . Sciatica 08/10/2011  . S/P arthroscopy of left knee 08/10/2011  . Tibial plateau fracture 08/10/2011  . Pain in joint, lower leg 02/12/2011  . Stiffness of joint, not elsewhere classified, lower leg 02/12/2011  . 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 Meniscus, medial, derangement 12/29/2010  . CLOSED FRACTURE OF UPPER END OF TIBIA 08/12/2010    Past Surgical History:  Procedure Laterality Date  . COLONOSCOPY  01/18/2008   DJM:EQASTM rectum.  Long redundant colon, a diminutive sigmoid polyp status post cold biopsy removed. Hyperplastic polyp. Repeat colonoscopy June 2014 due to family history of colon cancer  . COLONOSCOPY WITH ESOPHAGOGASTRODUODENOSCOPY (EGD) N/A 11/02/2012   HDQ:QIWLNLGX gastric mucosa of doubtful, +H.pylori. Incomplete colonoscopy due to patient unable to tolerate exam, proximal colon seen. Patient refused ACBE.  Marland Kitchen COLONOSCOPY WITH PROPOFOL N/A 03/08/2013   Dr. Gala Romney: colonic polyp-removed as scribed above. Internal Hemorrhoids. Pathology did not reveal any colonic tissue, only mucus. SURVEILLANCE DUE Aug 2019  . ESOPHAGOGASTRODUODENOSCOPY  01/18/2008   RMR: Normal esophagus, normal  stomach  . ESOPHAGOGASTRODUODENOSCOPY (EGD) WITH PROPOFOL N/A 02/09/2016   Procedure: ESOPHAGOGASTRODUODENOSCOPY (EGD) WITH PROPOFOL;  Surgeon: Daneil Dolin, MD;  [facility];  Service: Endoscopy;  Laterality: N/A;  730   . HYSTEROSCOPY  05/11/2016   Procedure: HYSTEROSCOPY;  Surgeon: Jonnie Kind, MD;  Location:  [facility];  Service: Gynecology;;  . KNEE SURGERY     left knee  . MULTIPLE EXTRACTIONS WITH ALVEOLOPLASTY N/A 10/15/2013   Procedure: MULTIPLE EXTRACTION WITH ALVEOLOPLASTY;  Surgeon: Gae Bon, DDS;  Location: [facility];  Service: Oral Surgery;  Laterality: N/A;  . POLYPECTOMY N/A 03/08/2013   Procedure: POLYPECTOMY;  Surgeon: Daneil Dolin, MD;  Location: [facility];  Service: Endoscopy;   Laterality: N/A;  . POLYPECTOMY N/A 05/11/2016   Procedure: REMOVAL OF ENDOMETRIAL POLYP;  Surgeon: Jonnie Kind, MD;  Location: [facility];  Service: Gynecology;  Laterality: N/A;  . TOTAL KNEE ARTHROPLASTY Left 02/05/2015   Procedure: LEFT TOTAL KNEE ARTHROPLASTY;  Surgeon: Carole Civil, MD;  Location: [facility];  Service: Orthopedics;  Laterality: Left;    OB History    Gravida Para Term Preterm AB Living             1   SAB TAB Ectopic Multiple Live Births                   Home Medications    Prior to Admission medications   Medication Sig Start Date End Date Taking? Authorizing Provider  amLODipine (NORVASC) 10 MG tablet Take 1 tablet by mouth daily. Take along with 5 mg for a total of 15 mg. 08/07/15   Historical Provider, MD  aspirin EC 325 MG EC tablet Take 1 tablet (325 mg total) by mouth 2 (two) times daily. 02/08/15   Carole Civil, MD  budesonide-formoterol [facility]) 160-4.5 MCG/ACT inhaler Inhale 2 puffs into the lungs 2 (two) times daily.    Historical Provider, MD  Cyanocobalamin (VITAMIN B 12 PO) Take 1 tablet by mouth daily.    Historical Provider, MD  cyclobenzaprine (FLEXERIL) 10 MG tablet Take 1 tablet (10 mg total) by mouth 3 (three) times daily. 02/24/16   Carole Civil, MD  esomeprazole (NEXIUM) 40 MG capsule Take 1 capsule (40 mg total) by mouth every morning. 12/19/14   Annitta Needs, NP  HYDROcodone-acetaminophen (NORCO) 7.5-325 MG tablet Take 1 tablet by mouth every 8 (eight) hours as needed for moderate pain. 05/24/16   Carole Civil, MD  HYDROcodone-acetaminophen (NORCO/VICODIN) 5-325 MG tablet Take 1 tablet by mouth every 6 (six) hours as needed for moderate pain. May take with ibuprofen Patient not taking: Reported on 05/21/2016 05/11/16   Jonnie Kind, MD  ibuprofen (ADVIL,MOTRIN) 800 MG tablet Take 1 tablet (800 mg total) by mouth every 8 (eight) hours as needed. 03/26/16   Carole Civil, MD  ketorolac (TORADOL) 10 MG tablet Take 1  tablet (10 mg total) by mouth every 6 (six) hours as needed (five day limit postop). 05/11/16   Jonnie Kind, MD  lisinopril (PRINIVIL,ZESTRIL) 20 MG tablet Take 1 tablet by mouth daily. 09/25/15   Historical Provider, MD  megestrol (MEGACE) 40 MG tablet Take 40 mg by mouth daily.    Historical Provider, MD  ondansetron (ZOFRAN ODT) 4 MG disintegrating tablet '4mg'$  ODT q4 hours prn nausea/vomit 11/24/15   Milton Ferguson, MD  oxyCODONE-acetaminophen (PERCOCET/ROXICET) 5-325 MG tablet Take 1 tablet by mouth every 6 (six) hours as needed. Patient not taking: Reported on 05/21/2016 11/24/15   Milton Ferguson, MD    Family History Family History  Problem Relation Age of Onset  . Colon cancer Brother 53       . Multiple myeloma Brother   . Liver cancer Sister   . Prostate cancer Brother   . Pancreatic  cancer Brother   . Cancer Mother     breast  . Asthma Mother     Social History Social History  Substance Use Topics  . Smoking status: Current Every Day Smoker    Packs/day: 1.00    Years: 40.00    Types: Cigarettes  . Smokeless tobacco: Never Used     Comment: Smokes one pack of cigarettes daily  . Alcohol use No     Comment: former, last Jan 2014     Allergies   Penicillins   Review of Systems Review of Systems ROS: Statement: All systems negative except as marked or noted in the HPI; Constitutional: Negative for fever and chills. ; ; Eyes: Negative for eye pain, redness and discharge. ; ; ENMT: Negative for ear pain, hoarseness, nasal congestion, sinus pressure and sore throat. ; ; Cardiovascular: Negative for chest pain, palpitations, diaphoresis, dyspnea and peripheral edema. ; ; Respiratory: Negative for cough, wheezing and stridor. ; ; Gastrointestinal: +N/V/D, abd pain. Negative for blood in stool, hematemesis, jaundice and rectal bleeding. . ; ; Genitourinary: Negative for dysuria, flank pain and hematuria. ; ; Musculoskeletal: Negative for back pain and neck pain. Negative for  swelling and trauma.; ; Skin: Negative for pruritus, rash, abrasions, blisters, bruising and skin lesion.; ; Neuro: Negative for headache, lightheadedness and neck stiffness. Negative for weakness, altered level of consciousness, altered mental status, extremity weakness, paresthesias, involuntary movement, seizure and syncope.       Physical Exam Updated Vital Signs BP 185/83 (BP Location: Left Arm)   Pulse 70   Temp 97.9 F (36.6 C) (Oral)   Resp 16   Ht 5\' 6"  (1.676 m)   Wt 176 lb (79.8 kg)   SpO2 100%   BMI 28.41 kg/m   Physical Exam 1845: Physical examination:  Nursing notes reviewed; Vital signs and O2 SAT reviewed;  Constitutional: Well developed, Well nourished, Uncomfortable appearing.; Head:  Normocephalic, atraumatic; Eyes: EOMI, PERRL, No scleral icterus; ENMT: Mouth and pharynx normal, Mucous membranes dry; Neck: Supple, Full range of motion, No lymphadenopathy; Cardiovascular: Regular rate and rhythm, No gallop; Respiratory: Breath sounds clear & equal bilaterally, No wheezes.  Speaking full sentences with ease, Normal respiratory effort/excursion; Chest: Nontender, Movement normal; Abdomen: Soft, +generalized tenderness to palp. No rebound or guarding. Nondistended, Normal bowel sounds; Genitourinary: No CVA tenderness; Extremities: Pulses normal, No tenderness, No edema, No calf edema or asymmetry.; Neuro: AA&Ox3, Major CN grossly intact.  Speech clear. No gross focal motor or sensory deficits in extremities.; Skin: Color normal, Warm, Dry.   ED Treatments / Results  Labs (all labs ordered are listed, but only abnormal results are displayed)   EKG  EKG Interpretation None       Radiology   Procedures Procedures (including critical care time)  Medications Ordered in ED Medications  ondansetron (ZOFRAN) injection 4 mg (not administered)  0.9 %  sodium chloride infusion ( Intravenous New Bag/Given 06/08/16 1900)  ondansetron (ZOFRAN) injection 4 mg (4 mg  Intravenous Given 06/08/16 1858)  famotidine (PEPCID) IVPB 20 mg premix (20 mg Intravenous New Bag/Given 06/08/16 1900)  morphine 4 MG/ML injection 4 mg (4 mg Intravenous Given 06/08/16 1901)  iopamidol (ISOVUE-300) 61 % injection (not administered)  lidocaine (PF) (XYLOCAINE) 1 % injection (0.5 mLs  Given 06/08/16 1902)     Initial Impression / Assessment and Plan / ED Course  I have reviewed the triage vital signs and the nursing notes.  Pertinent labs & imaging results that were available during my  care of the patient were reviewed by me and considered in my medical decision making (see chart for details).  MDM Reviewed: previous chart, nursing note and vitals Reviewed previous: labs Interpretation: labs, x-ray and CT scan   Results for orders placed or performed during the hospital encounter of 06/08/16  Lipase, blood  Result Value Ref Range   Lipase 19 11 - 51 U/L  Comprehensive metabolic panel  Result Value Ref Range   Sodium 135 135 - 145 mmol/L   Potassium 3.7 3.5 - 5.1 mmol/L   Chloride 106 101 - 111 mmol/L   CO2 19 (L) 22 - 32 mmol/L   Glucose, Bld 117 (H) 65 - 99 mg/dL   BUN 6 6 - 20 mg/dL   Creatinine, Ser 8.95 0.44 - 1.00 mg/dL   Calcium 01.1 8.9 - 56.7 mg/dL   Total Protein 8.9 (H) 6.5 - 8.1 g/dL   Albumin 4.8 3.5 - 5.0 g/dL   AST 18 15 - 41 U/L   ALT 16 14 - 54 U/L   Alkaline Phosphatase 66 38 - 126 U/L   Total Bilirubin 1.2 0.3 - 1.2 mg/dL   GFR calc non Af Amer >60 >60 mL/min   GFR calc Af Amer >60 >60 mL/min   Anion gap 10 5 - 15  CBC  Result Value Ref Range   WBC 8.6 4.0 - 10.5 K/uL   RBC 4.69 3.87 - 5.11 MIL/uL   Hemoglobin 14.9 12.0 - 15.0 g/dL   HCT 16.4 08.9 - 09.7 %   MCV 91.7 78.0 - 100.0 fL   MCH 31.8 26.0 - 34.0 pg   MCHC 34.7 30.0 - 36.0 g/dL   RDW 52.9 55.3 - 97.1 %   Platelets 208 150 - 400 K/uL  Urinalysis, Routine w reflex microscopic  Result Value Ref Range   Color, Urine YELLOW YELLOW   APPearance CLEAR CLEAR   Specific Gravity,  Urine 1.025 1.005 - 1.030   pH 6.0 5.0 - 8.0   Glucose, UA NEGATIVE NEGATIVE mg/dL   Hgb urine dipstick MODERATE (A) NEGATIVE   Bilirubin Urine NEGATIVE NEGATIVE   Ketones, ur 15 (A) NEGATIVE mg/dL   Protein, ur 410 (A) NEGATIVE mg/dL   Nitrite NEGATIVE NEGATIVE   Leukocytes, UA NEGATIVE NEGATIVE  Urine microscopic-add on  Result Value Ref Range   Squamous Epithelial / LPF 0-5 (A) NONE SEEN   WBC, UA 0-5 0 - 5 WBC/hpf   RBC / HPF 0-5 0 - 5 RBC/hpf   Bacteria, UA FEW (A) NONE SEEN   Casts HYALINE CASTS (A) NEGATIVE  Lactic acid, plasma  Result Value Ref Range   Lactic Acid, Venous 1.9 0.5 - 1.9 mmol/L   Dg Chest 2 View Result Date: 06/08/2016 CLINICAL DATA:  60 y/o F; abdominal pain, nausea, vomiting, and diarrhea. EXAM: CHEST  2 VIEW COMPARISON:  03/27/2016 chest radiograph FINDINGS: Stable cardiac silhouette given patient rotation and technique. No focal consolidation. No pneumothorax or effusion. Mild degenerative changes of thoracic spine. IMPRESSION: No active cardiopulmonary disease. Electronically Signed   By: Mitzi Hansen M.D.   On: 06/08/2016 19:51   Ct Abdomen Pelvis W Contrast Result Date: 06/08/2016 CLINICAL DATA:  Nausea vomiting and diarrhea with abdominal pain EXAM: CT ABDOMEN AND PELVIS WITH CONTRAST TECHNIQUE: Multidetector CT imaging of the abdomen and pelvis was performed using the standard protocol following bolus administration of intravenous contrast. CONTRAST:  ISOVUE-300 IOPAMIDOL (ISOVUE-300) INJECTION 61% COMPARISON:  11/24/2015 FINDINGS: Lower chest: Limited by mild respiratory motion  artifact. No acute infiltrate or effusion. Heart size upper normal. No gross pericardial effusion at the lung bases. Hepatobiliary: No intra hepatic biliary dilatation. No focal hepatic abnormality. No calcified gallstones. Slightly enlarged extrahepatic common bile duct up to 8 mm, no definite calcified stones seen. Pancreas: No inflammatory changes. Mildly prominent  proximal pancreatic duct as before. Spleen: Normal in size without focal abnormality. Adrenals/Urinary Tract: Mild thickening of the left adrenal gland without mass. Tiny sub cm hypodense lesions in the bilateral kidneys. These are likely stable. Bladder unremarkable. Stomach/Bowel: The stomach is nonenlarged. No small bowel dilatation. Questionable mild diffuse wall thickening of the colon but without significant inflammation in the adjacent fat. The appendix is visualized and is normal. Vascular/Lymphatic: Atherosclerotic vascular calcifications. No significantly enlarged abdominal or pelvic lymph nodes. Reproductive: Prominent uterus with calcified masses/presumed fibroids. Hypodense fluid or possible thickened endometrial stripe within the uterus. No adnexal masses. Other: No free air or free fluid. Small fatty containing umbilical hernia. Musculoskeletal: No acute osseous abnormality. IMPRESSION: 1. Questionable colon wall thickening versus underdistention, no significant inflammation in the fat surrounding the colon. Normal appendix. No evidence for bowel obstruction. 2. Stable slightly enlarged extrahepatic common bile duct and prominent pancreatic duct. 3. Prominent uterus with calcified masses/fibroids. Possible small amount of hypodense fluid within the uterine cavity burst is mildly thickened endometrium. Nonemergent pelvic ultrasound may be obtained for further evaluation. Electronically Signed   By: Donavan Foil M.D.   On: 06/08/2016 21:19    2155:  Pt continues to c/o nausea and abd pain despite several doses of IV meds for both and unable to tol PO; will re-mediate. No stooling while in the ED.  Dx and testing d/w pt and family.  Questions answered.  Verb understanding, agreeable to observation admit.  T/C to [facility] Dr. Myna Hidalgo, case discussed, including:  HPI, pertinent PM/SHx, VS/PE, dx testing, ED course and treatment:  Agreeable to admit, requests to write temporary orders, obtain observation  medical bed to Dr. Josephine Cables service.    Final Clinical Impressions(s) / ED Diagnoses   Final diagnoses:  None    New Prescriptions New Prescriptions   No medications on file     Francine Graven, DO 06/12/16 2123

## 2016-06-08 NOTE — ED Notes (Signed)
Pt stable and ready for transport to [facility].  Report called to Marisue Humble, RN.

## 2016-06-09 DIAGNOSIS — N852 Hypertrophy of uterus: Secondary | ICD-10-CM | POA: Diagnosis present

## 2016-06-09 DIAGNOSIS — K529 Noninfective gastroenteritis and colitis, unspecified: Secondary | ICD-10-CM | POA: Diagnosis not present

## 2016-06-09 DIAGNOSIS — K7469 Other cirrhosis of liver: Secondary | ICD-10-CM | POA: Diagnosis not present

## 2016-06-09 LAB — BASIC METABOLIC PANEL
Anion gap: 8 (ref 5–15)
BUN: 8 mg/dL (ref 6–20)
CO2: 19 mmol/L — ABNORMAL LOW (ref 22–32)
Calcium: 9.6 mg/dL (ref 8.9–10.3)
Chloride: 110 mmol/L (ref 101–111)
Creatinine, Ser: 0.84 mg/dL (ref 0.44–1.00)
GFR calc Af Amer: >60 mL/min (ref >60)
GFR calc non Af Amer: >60 mL/min (ref >60)
Glucose, Bld: 105 mg/dL — ABNORMAL HIGH (ref 65–99)
Potassium: 3.3 mmol/L — ABNORMAL LOW (ref 3.5–5.1)
Sodium: 137 mmol/L (ref 135–145)

## 2016-06-09 LAB — LACTIC ACID, PLASMA: Lactic Acid, Venous: 1.5 mmol/L (ref 0.5–1.9)

## 2016-06-09 LAB — GLUCOSE, CAPILLARY: Glucose-Capillary: 115 mg/dL — ABNORMAL HIGH (ref 65–99)

## 2016-06-09 MED ORDER — HYDROCODONE-ACETAMINOPHEN 7.5-325 MG PO TABS: 1.0000 | ORAL_TABLET | Freq: Three times a day (TID) | ORAL | 0 refills | Status: DC | PRN

## 2016-06-09 NOTE — Care Management Note (Signed)
Case Management Note  Patient Details  Name: Colleen Lee MRN: [id number] Date of Birth: 1956-07-20  Subjective/Objective:                  Pt is from home, lives alone and is ind with ADL's. Pt has PCP, drives herself to appointments and has no difficulty affording or obtaining her medications. Pt plans to return home with self care today.   Action/Plan: No CM needs.   Expected Discharge Date:    06/09/2016              Expected Discharge Plan:  Home/Self Care  In-House Referral:  NA  Discharge planning Services  CM Consult  Post Acute Care Choice:  NA Choice offered to:  NA  Status of Service:  Completed, signed off   Sherald Barge, RN 06/09/2016, 11:35 AM

## 2016-06-09 NOTE — Progress Notes (Signed)
Colleen Lee discharged Home per MD order.  Discharge instructions reviewed and discussed with the patient, all questions and concerns answered. Copy of instructions and scripts given to patient.    Medication List    STOP taking these medications   ibuprofen 800 MG tablet Commonly known as:  ADVIL,MOTRIN   ketorolac 10 MG tablet Commonly known as:  TORADOL   oxyCODONE-acetaminophen 5-325 MG tablet Commonly known as:  PERCOCET/ROXICET     TAKE these medications   amLODipine 10 MG tablet Commonly known as:  NORVASC Take 1 tablet by mouth daily.   aspirin 325 MG EC tablet Take 1 tablet (325 mg total) by mouth 2 (two) times daily. What changed:  when to take this   budesonide-formoterol 160-4.5 MCG/ACT inhaler Commonly known as:  SYMBICORT Inhale 2 puffs into the lungs 2 (two) times daily.   cyclobenzaprine 10 MG tablet Commonly known as:  FLEXERIL Take 1 tablet (10 mg total) by mouth 3 (three) times daily. What changed:  when to take this  reasons to take this   esomeprazole 40 MG capsule Commonly known as:  NEXIUM Take 1 capsule (40 mg total) by mouth every morning.   HYDROcodone-acetaminophen 7.5-325 MG tablet Commonly known as:  NORCO Take 1 tablet by mouth every 8 (eight) hours as needed for moderate pain. What changed:  Another medication with the same name was added. Make sure you understand how and when to take each.   HYDROcodone-acetaminophen 7.5-325 MG tablet Commonly known as:  NORCO Take 1 tablet by mouth every 8 (eight) hours as needed for moderate pain. What changed:  You were already taking a medication with the same name, and this prescription was added. Make sure you understand how and when to take each.   lisinopril 20 MG tablet Commonly known as:  PRINIVIL,ZESTRIL Take 1 tablet by mouth daily.   megestrol 40 MG tablet Commonly known as:  MEGACE Take 40 mg by mouth daily.   ondansetron 4 MG disintegrating tablet Commonly known as:  ZOFRAN  ODT 4mg  ODT q4 hours prn nausea/vomit What changed:  how much to take  how to take this  when to take this  reasons to take this  additional instructions   VENTOLIN HFA 108 (90 Base) MCG/ACT inhaler Generic drug:  albuterol Inhale 1 puff into the lungs daily as needed for shortness of breath.   VITAMIN B 12 PO Take 1 tablet by mouth daily.       Patients skin is clean, dry and intact, no evidence of skin break down. IV site discontinued and catheter remains intact. Site without signs and symptoms of complications. Dressing and pressure applied.  Patient escorted to car by NT in a wheelchair,  no distress noted upon discharge.  [name] [name] 06/09/2016 3:49 PM

## 2016-06-09 NOTE — Progress Notes (Signed)
Subjective: She says she feels much better. She was admitted last night with intractable nausea and vomiting thought to be related to viral gastroenteritis. She also had diarrhea. She's not having any nausea now. No vomiting. No diarrhea. She's had some abdominal pain that she relates to her vomiting. No other new complaints. No chest pain. Her breathing is okay.  Objective: Vital signs in last 24 hours: Temp:  [97.9 F (36.6 C)-98.8 F (37.1 C)] 98.8 F (37.1 C) (11/08 0500) Pulse Rate:  [61-75] 70 (11/08 0500) Resp:  [16-28] 20 (11/08 0500) BP: (114-191)/(67-92) 114/68 (11/08 0500) SpO2:  [94 %-100 %] 99 % (11/08 0500) Weight:  [74.7 kg (164 lb 10.9 oz)-79.8 kg (176 lb)] 74.7 kg (164 lb 10.9 oz) (11/08 0500) Weight change:  Last BM Date: 06/08/16  Intake/Output from previous day: 11/07 0701 - 11/08 0700 In: 605 [I.V.:555; IV Piggyback:50] Out: 350 [Urine:350]  PHYSICAL EXAM General appearance: alert, cooperative and no distress Resp: clear to auscultation bilaterally Cardio: regular rate and rhythm, S1, S2 normal, no murmur, click, rub or gallop GI: Her abdomen is somewhat protuberant and she has minimal tenderness in both lower quadrants. Bowel sounds are present and active Extremities: extremities normal, atraumatic, no cyanosis or edema Skin warm and dry. Mucous membranes are moist  Lab Results:  Results for orders placed or performed during the hospital encounter of 06/08/16 (from the past 48 hour(s))  Urinalysis, Routine w reflex microscopic     Status: Abnormal   Collection Time: 06/08/16  6:30 PM  Result Value Ref Range   Color, Urine YELLOW YELLOW   APPearance CLEAR CLEAR   Specific Gravity, Urine 1.025 1.005 - 1.030   pH 6.0 5.0 - 8.0   Glucose, UA NEGATIVE NEGATIVE mg/dL   Hgb urine dipstick MODERATE (A) NEGATIVE   Bilirubin Urine NEGATIVE NEGATIVE   Ketones, ur 15 (A) NEGATIVE mg/dL   Protein, ur 100 (A) NEGATIVE mg/dL   Nitrite NEGATIVE NEGATIVE    Leukocytes, UA NEGATIVE NEGATIVE  Urine microscopic-add on     Status: Abnormal   Collection Time: 06/08/16  6:30 PM  Result Value Ref Range   Squamous Epithelial / LPF 0-5 (A) NONE SEEN   WBC, UA 0-5 0 - 5 WBC/hpf   RBC / HPF 0-5 0 - 5 RBC/hpf   Bacteria, UA FEW (A) NONE SEEN   Casts HYALINE CASTS (A) NEGATIVE  Lipase, blood     Status: None   Collection Time: 06/08/16  6:53 PM  Result Value Ref Range   Lipase 19 11 - 51 U/L  Comprehensive metabolic panel     Status: Abnormal   Collection Time: 06/08/16  6:53 PM  Result Value Ref Range   Sodium 135 135 - 145 mmol/L   Potassium 3.7 3.5 - 5.1 mmol/L   Chloride 106 101 - 111 mmol/L   CO2 19 (L) 22 - 32 mmol/L   Glucose, Bld 117 (H) 65 - 99 mg/dL   BUN 6 6 - 20 mg/dL   Creatinine, Ser 0.84 0.44 - 1.00 mg/dL   Calcium 10.3 8.9 - 10.3 mg/dL   Total Protein 8.9 (H) 6.5 - 8.1 g/dL   Albumin 4.8 3.5 - 5.0 g/dL   AST 18 15 - 41 U/L   ALT 16 14 - 54 U/L   Alkaline Phosphatase 66 38 - 126 U/L   Total Bilirubin 1.2 0.3 - 1.2 mg/dL   GFR calc non Af Amer >60 >60 mL/min   GFR calc Af Amer >60 >60  mL/min    Comment: (NOTE) The eGFR has been calculated using the CKD EPI equation. This calculation has not been validated in all clinical situations. eGFR's persistently <60 mL/min signify possible Chronic Kidney Disease.    Anion gap 10 5 - 15  CBC     Status: None   Collection Time: 06/08/16  6:53 PM  Result Value Ref Range   WBC 8.6 4.0 - 10.5 K/uL   RBC 4.69 3.87 - 5.11 MIL/uL   Hemoglobin 14.9 12.0 - 15.0 g/dL   HCT 43.0 36.0 - 46.0 %   MCV 91.7 78.0 - 100.0 fL   MCH 31.8 26.0 - 34.0 pg   MCHC 34.7 30.0 - 36.0 g/dL   RDW 14.8 11.5 - 15.5 %   Platelets 208 150 - 400 K/uL  Lactic acid, plasma     Status: None   Collection Time: 06/08/16  6:53 PM  Result Value Ref Range   Lactic Acid, Venous 1.9 0.5 - 1.9 mmol/L  Lactic acid, plasma     Status: None   Collection Time: 06/08/16 11:28 PM  Result Value Ref Range   Lactic Acid,  Venous 1.5 0.5 - 1.9 mmol/L  Basic metabolic panel     Status: Abnormal   Collection Time: 06/09/16  6:00 AM  Result Value Ref Range   Sodium 137 135 - 145 mmol/L   Potassium 3.3 (L) 3.5 - 5.1 mmol/L   Chloride 110 101 - 111 mmol/L   CO2 19 (L) 22 - 32 mmol/L   Glucose, Bld 105 (H) 65 - 99 mg/dL   BUN 8 6 - 20 mg/dL   Creatinine, Ser 0.84 0.44 - 1.00 mg/dL   Calcium 9.6 8.9 - 10.3 mg/dL   GFR calc non Af Amer >60 >60 mL/min   GFR calc Af Amer >60 >60 mL/min    Comment: (NOTE) The eGFR has been calculated using the CKD EPI equation. This calculation has not been validated in all clinical situations. eGFR's persistently <60 mL/min signify possible Chronic Kidney Disease.    Anion gap 8 5 - 15  Glucose, capillary     Status: Abnormal   Collection Time: 06/09/16  7:23 AM  Result Value Ref Range   Glucose-Capillary 115 (H) 65 - 99 mg/dL   Comment 1 Notify RN    Comment 2 Document in Chart     ABGS No results for input(s): PHART, PO2ART, TCO2, HCO3 in the last 72 hours.  Invalid input(s): PCO2 CULTURES No results found for this or any previous visit (from the past 240 hour(s)). Studies/Results: Dg Chest 2 View  Result Date: 06/08/2016 CLINICAL DATA:  60 y/o F; abdominal pain, nausea, vomiting, and diarrhea. EXAM: CHEST  2 VIEW COMPARISON:  03/27/2016 chest radiograph FINDINGS: Stable cardiac silhouette given patient rotation and technique. No focal consolidation. No pneumothorax or effusion. Mild degenerative changes of thoracic spine. IMPRESSION: No active cardiopulmonary disease. Electronically Signed   By: Kristine Garbe M.D.   On: 06/08/2016 19:51   Ct Abdomen Pelvis W Contrast  Result Date: 06/08/2016 CLINICAL DATA:  Nausea vomiting and diarrhea with abdominal pain EXAM: CT ABDOMEN AND PELVIS WITH CONTRAST TECHNIQUE: Multidetector CT imaging of the abdomen and pelvis was performed using the standard protocol following bolus administration of intravenous contrast.  CONTRAST:  131m ISOVUE-300 IOPAMIDOL (ISOVUE-300) INJECTION 61% COMPARISON:  11/24/2015 FINDINGS: Lower chest: Limited by mild respiratory motion artifact. No acute infiltrate or effusion. Heart size upper normal. No gross pericardial effusion at the lung bases. Hepatobiliary: No  intra hepatic biliary dilatation. No focal hepatic abnormality. No calcified gallstones. Slightly enlarged extrahepatic common bile duct up to 8 mm, no definite calcified stones seen. Pancreas: No inflammatory changes. Mildly prominent proximal pancreatic duct as before. Spleen: Normal in size without focal abnormality. Adrenals/Urinary Tract: Mild thickening of the left adrenal gland without mass. Tiny sub cm hypodense lesions in the bilateral kidneys. These are likely stable. Bladder unremarkable. Stomach/Bowel: The stomach is nonenlarged. No small bowel dilatation. Questionable mild diffuse wall thickening of the colon but without significant inflammation in the adjacent fat. The appendix is visualized and is normal. Vascular/Lymphatic: Atherosclerotic vascular calcifications. No significantly enlarged abdominal or pelvic lymph nodes. Reproductive: Prominent uterus with calcified masses/presumed fibroids. Hypodense fluid or possible thickened endometrial stripe within the uterus. No adnexal masses. Other: No free air or free fluid. Small fatty containing umbilical hernia. Musculoskeletal: No acute osseous abnormality. IMPRESSION: 1. Questionable colon wall thickening versus underdistention, no significant inflammation in the fat surrounding the colon. Normal appendix. No evidence for bowel obstruction. 2. Stable slightly enlarged extrahepatic common bile duct and prominent pancreatic duct. 3. Prominent uterus with calcified masses/fibroids. Possible small amount of hypodense fluid within the uterine cavity burst is mildly thickened endometrium. Nonemergent pelvic ultrasound may be obtained for further evaluation. Electronically Signed    By: Donavan Foil M.D.   On: 06/08/2016 21:19    Medications:  Prior to Admission:  Prescriptions Prior to Admission  Medication Sig Dispense Refill Last Dose  . amLODipine (NORVASC) 10 MG tablet Take 1 tablet by mouth daily. Take along with 5 mg for a total of 15 mg.   Not Taking  . aspirin EC 325 MG EC tablet Take 1 tablet (325 mg total) by mouth 2 (two) times daily. 60 tablet 0 Taking  . budesonide-formoterol (SYMBICORT) 160-4.5 MCG/ACT inhaler Inhale 2 puffs into the lungs 2 (two) times daily.   Taking  . Cyanocobalamin (VITAMIN B 12 PO) Take 1 tablet by mouth daily.   Taking  . cyclobenzaprine (FLEXERIL) 10 MG tablet Take 1 tablet (10 mg total) by mouth 3 (three) times daily. 90 tablet 5 Taking  . esomeprazole (NEXIUM) 40 MG capsule Take 1 capsule (40 mg total) by mouth every morning. 10 capsule 0 Taking  . HYDROcodone-acetaminophen (NORCO) 7.5-325 MG tablet Take 1 tablet by mouth every 8 (eight) hours as needed for moderate pain. 90 tablet 0   . HYDROcodone-acetaminophen (NORCO/VICODIN) 5-325 MG tablet Take 1 tablet by mouth every 6 (six) hours as needed for moderate pain. May take with ibuprofen (Patient not taking: Reported on 05/21/2016) 15 tablet 0 Not Taking  . ibuprofen (ADVIL,MOTRIN) 800 MG tablet Take 1 tablet (800 mg total) by mouth every 8 (eight) hours as needed. 90 tablet 5 Taking  . ketorolac (TORADOL) 10 MG tablet Take 1 tablet (10 mg total) by mouth every 6 (six) hours as needed (five day limit postop). 20 tablet 0 Taking  . lisinopril (PRINIVIL,ZESTRIL) 20 MG tablet Take 1 tablet by mouth daily.   Taking  . megestrol (MEGACE) 40 MG tablet Take 40 mg by mouth daily.   Not Taking  . ondansetron (ZOFRAN ODT) 4 MG disintegrating tablet 37m ODT q4 hours prn nausea/vomit 12 tablet 0 Taking  . oxyCODONE-acetaminophen (PERCOCET/ROXICET) 5-325 MG tablet Take 1 tablet by mouth every 6 (six) hours as needed. (Patient not taking: Reported on 05/21/2016) 20 tablet 0 Not Taking    Scheduled: . amLODipine  10 mg Oral Daily  . aspirin  325 mg Oral BID  .  cyclobenzaprine  10 mg Oral TID  . enoxaparin (LOVENOX) injection  40 mg Subcutaneous Q24H  . lisinopril  20 mg Oral Daily  . megestrol  40 mg Oral Daily  . mometasone-formoterol  2 puff Inhalation BID  . pantoprazole  40 mg Oral Daily   Continuous: . sodium chloride 100 mL/hr at 06/09/16 0524   ELF:[id number] **OR** acetaminophen, hydrALAZINE, HYDROcodone-acetaminophen, HYDROmorphone (DILAUDID) injection, ondansetron **OR** ondansetron (ZOFRAN) IV  Assesment: She was admitted with intractable nausea and vomiting presumed acute infective gastroenteritis and abdominal pain. She is substantially improved this morning. Principal Problem:   Intractable nausea and vomiting Active Problems:   Asthma   Acute infective gastroenteritis   Diarrhea   Essential hypertension   GERD (gastroesophageal reflux disease)   Abdominal pain    Plan: Clear liquids for breakfast. Advance to heart healthy 4 lunch and if she tolerates that she can go home    LOS: 0 days   Katiana Ruland L 06/09/2016, 8:33 AM

## 2016-06-09 NOTE — Care Management Obs Status (Signed)
[place] NOTIFICATION   Patient Details  Name: ALLEGRA WARFEL MRN: [id number] Date of Birth: January 29, 1956   Medicare Observation Status Notification Given:  Yes    Sherald Barge, RN 06/09/2016, 11:34 AM

## 2016-06-09 NOTE — Discharge Summary (Signed)
Physician Discharge Summary  Patient ID: Colleen Lee MRN: [id number] DOB/AGE: 1956/06/26 60 y.o. Primary Care Physician:FANTA,TESFAYE, MD Admit date: 06/08/2016 Discharge date: 06/09/2016    Discharge Diagnoses:   Principal Problem:   Intractable nausea and vomiting Active Problems:   Asthma   Acute infective gastroenteritis   Diarrhea   Essential hypertension   GERD (gastroesophageal reflux disease)   Abdominal pain   Uterine enlargement     Medication List    STOP taking these medications   ibuprofen 800 MG tablet Commonly known as:  ADVIL,MOTRIN   ketorolac 10 MG tablet Commonly known as:  TORADOL   oxyCODONE-acetaminophen 5-325 MG tablet Commonly known as:  PERCOCET/ROXICET     TAKE these medications   amLODipine 10 MG tablet Commonly known as:  NORVASC Take 1 tablet by mouth daily.   aspirin 325 MG EC tablet Take 1 tablet (325 mg total) by mouth 2 (two) times daily. What changed:  when to take this   budesonide-formoterol 160-4.5 MCG/ACT inhaler Commonly known as:  SYMBICORT Inhale 2 puffs into the lungs 2 (two) times daily.   cyclobenzaprine 10 MG tablet Commonly known as:  FLEXERIL Take 1 tablet (10 mg total) by mouth 3 (three) times daily. What changed:  when to take this  reasons to take this   esomeprazole 40 MG capsule Commonly known as:  NEXIUM Take 1 capsule (40 mg total) by mouth every morning.   HYDROcodone-acetaminophen 7.5-325 MG tablet Commonly known as:  NORCO Take 1 tablet by mouth every 8 (eight) hours as needed for moderate pain. What changed:  Another medication with the same name was added. Make sure you understand how and when to take each.   HYDROcodone-acetaminophen 7.5-325 MG tablet Commonly known as:  NORCO Take 1 tablet by mouth every 8 (eight) hours as needed for moderate pain. What changed:  You were already taking a medication with the same name, and this prescription was added. Make sure you understand how and  when to take each.   lisinopril 20 MG tablet Commonly known as:  PRINIVIL,ZESTRIL Take 1 tablet by mouth daily.   megestrol 40 MG tablet Commonly known as:  MEGACE Take 40 mg by mouth daily.   ondansetron 4 MG disintegrating tablet Commonly known as:  ZOFRAN ODT 4mg  ODT q4 hours prn nausea/vomit What changed:  how much to take  how to take this  when to take this  reasons to take this  additional instructions   VENTOLIN HFA 108 (90 Base) MCG/ACT inhaler Generic drug:  albuterol Inhale 1 puff into the lungs daily as needed for shortness of breath.   VITAMIN B 12 PO Take 1 tablet by mouth daily.       Discharged Condition:Improved    Consults: None  Significant Diagnostic Studies: Dg Chest 2 View  Result Date: 06/08/2016 CLINICAL DATA:  60 y/o F; abdominal pain, nausea, vomiting, and diarrhea. EXAM: CHEST  2 VIEW COMPARISON:  03/27/2016 chest radiograph FINDINGS: Stable cardiac silhouette given patient rotation and technique. No focal consolidation. No pneumothorax or effusion. Mild degenerative changes of thoracic spine. IMPRESSION: No active cardiopulmonary disease. Electronically Signed   By: Mitzi Hansen M.D.   On: 06/08/2016 19:51   Ct Abdomen Pelvis W Contrast  Result Date: 06/08/2016 CLINICAL DATA:  Nausea vomiting and diarrhea with abdominal pain EXAM: CT ABDOMEN AND PELVIS WITH CONTRAST TECHNIQUE: Multidetector CT imaging of the abdomen and pelvis was performed using the standard protocol following bolus administration of intravenous contrast. CONTRAST:   ISOVUE-300 IOPAMIDOL (ISOVUE-300) INJECTION 61% COMPARISON:  11/24/2015 FINDINGS: Lower chest: Limited by mild respiratory motion artifact. No acute infiltrate or effusion. Heart size upper normal. No gross pericardial effusion at the lung bases. Hepatobiliary: No intra hepatic biliary dilatation. No focal hepatic abnormality. No calcified gallstones. Slightly enlarged extrahepatic common bile  duct up to 8 mm, no definite calcified stones seen. Pancreas: No inflammatory changes. Mildly prominent proximal pancreatic duct as before. Spleen: Normal in size without focal abnormality. Adrenals/Urinary Tract: Mild thickening of the left adrenal gland without mass. Tiny sub cm hypodense lesions in the bilateral kidneys. These are likely stable. Bladder unremarkable. Stomach/Bowel: The stomach is nonenlarged. No small bowel dilatation. Questionable mild diffuse wall thickening of the colon but without significant inflammation in the adjacent fat. The appendix is visualized and is normal. Vascular/Lymphatic: Atherosclerotic vascular calcifications. No significantly enlarged abdominal or pelvic lymph nodes. Reproductive: Prominent uterus with calcified masses/presumed fibroids. Hypodense fluid or possible thickened endometrial stripe within the uterus. No adnexal masses. Other: No free air or free fluid. Small fatty containing umbilical hernia. Musculoskeletal: No acute osseous abnormality. IMPRESSION: 1. Questionable colon wall thickening versus underdistention, no significant inflammation in the fat surrounding the colon. Normal appendix. No evidence for bowel obstruction. 2. Stable slightly enlarged extrahepatic common bile duct and prominent pancreatic duct. 3. Prominent uterus with calcified masses/fibroids. Possible small amount of hypodense fluid within the uterine cavity burst is mildly thickened endometrium. Nonemergent pelvic ultrasound may be obtained for further evaluation. Electronically Signed   By: Jasmine Pang M.D.   On: 06/08/2016 21:19    Lab Results: Basic Metabolic Panel:  Recent Labs  86/57/84 1853 06/09/16 0600  NA 135 137  K 3.7 3.3*  CL 106 110  CO2 19* 19*  GLUCOSE 117* 105*  BUN 6 8  CREATININE 0.84 0.84  CALCIUM 10.3 9.6   Liver Function Tests:  Recent Labs  06/08/16 1853  AST 18  ALT 16  ALKPHOS 66  BILITOT 1.2  PROT 8.9*  ALBUMIN 4.8     CBC:  Recent  Labs  06/08/16 1853  WBC 8.6  HGB 14.9  HCT 43.0  MCV 91.7  PLT 208    No results found for this or any previous visit (from the past 240 hour(s)).   Hospital Course: This is a 60 year old who came to the hospital with intractable nausea and vomiting. She was treated in the emergency department but did not clear and was not able to go home. She had abdominal pain that she relates to multiple episodes of vomitus. She was given medications and IV fluids overnight and was markedly better the next morning. She was started on clear liquid diet and tolerated that and her diet was advanced to heart healthy and she tolerated that. She still had some mild abdominal pain but no nausea or vomiting or diarrhea.  Discharge Exam: Blood pressure 114/68, pulse 70, temperature 98.8 F (37.1 C), temperature source Oral, resp. rate 20, height 5\' 6"  (1.676 m), weight 74.7 kg (164 lb 10.9 oz), SpO2 99 %. She is awake and alert. She looks comfortable. Minimal tenderness of her abdomen that she describes as musculoskeletal.  Disposition: Home. She had incidental finding of an enlarged uterus that can be worked up as an outpatient if not already done  Discharge Instructions    Discharge patient    Complete by:  As directed         Signed: Tyeisha Dinan L   06/09/2016, 2:24 PM

## 2016-06-10 ENCOUNTER — Encounter (HOSPITAL_COMMUNITY): Payer: Self-pay

## 2016-06-10 ENCOUNTER — Inpatient Hospital Stay (HOSPITAL_COMMUNITY)
Admission: AD | Admit: 2016-06-10 | Discharge: 2016-06-13 | DRG: 392 | Disposition: A | Discharge status: Home / Self Care | Payer: Commercial Managed Care - HMO | Source: Ambulatory Visit | Attending: Family Medicine | Admitting: Family Medicine

## 2016-06-10 ENCOUNTER — Emergency Department (HOSPITAL_COMMUNITY)
Admission: EM | Admit: 2016-06-10 | Discharge: 2016-06-10 | Disposition: A | Discharge status: Home / Self Care | Payer: Commercial Managed Care - HMO | Source: Home / Self Care | Attending: Emergency Medicine | Admitting: Emergency Medicine

## 2016-06-10 DIAGNOSIS — R197 Diarrhea, unspecified: Principal | ICD-10-CM

## 2016-06-10 DIAGNOSIS — F17210 Nicotine dependence, cigarettes, uncomplicated: Secondary | ICD-10-CM

## 2016-06-10 DIAGNOSIS — B9620 Unspecified Escherichia coli [E. coli] as the cause of diseases classified elsewhere: Secondary | ICD-10-CM | POA: Diagnosis present

## 2016-06-10 DIAGNOSIS — J45909 Unspecified asthma, uncomplicated: Secondary | ICD-10-CM

## 2016-06-10 DIAGNOSIS — M549 Dorsalgia, unspecified: Secondary | ICD-10-CM | POA: Diagnosis present

## 2016-06-10 DIAGNOSIS — R112 Nausea with vomiting, unspecified: Secondary | ICD-10-CM | POA: Diagnosis not present

## 2016-06-10 DIAGNOSIS — Z79899 Other long term (current) drug therapy: Secondary | ICD-10-CM

## 2016-06-10 DIAGNOSIS — J4540 Moderate persistent asthma, uncomplicated: Secondary | ICD-10-CM

## 2016-06-10 DIAGNOSIS — A09 Infectious gastroenteritis and colitis, unspecified: Secondary | ICD-10-CM | POA: Diagnosis present

## 2016-06-10 DIAGNOSIS — R109 Unspecified abdominal pain: Secondary | ICD-10-CM | POA: Diagnosis not present

## 2016-06-10 DIAGNOSIS — K7469 Other cirrhosis of liver: Secondary | ICD-10-CM | POA: Diagnosis not present

## 2016-06-10 DIAGNOSIS — N390 Urinary tract infection, site not specified: Secondary | ICD-10-CM | POA: Diagnosis present

## 2016-06-10 DIAGNOSIS — M797 Fibromyalgia: Secondary | ICD-10-CM | POA: Diagnosis present

## 2016-06-10 DIAGNOSIS — Z7951 Long term (current) use of inhaled steroids: Secondary | ICD-10-CM

## 2016-06-10 DIAGNOSIS — K7030 Alcoholic cirrhosis of liver without ascites: Secondary | ICD-10-CM | POA: Diagnosis present

## 2016-06-10 DIAGNOSIS — Z7982 Long term (current) use of aspirin: Secondary | ICD-10-CM | POA: Diagnosis not present

## 2016-06-10 DIAGNOSIS — G8929 Other chronic pain: Secondary | ICD-10-CM | POA: Diagnosis present

## 2016-06-10 DIAGNOSIS — Z880 Allergy status to penicillin: Secondary | ICD-10-CM

## 2016-06-10 DIAGNOSIS — I10 Essential (primary) hypertension: Secondary | ICD-10-CM

## 2016-06-10 DIAGNOSIS — K219 Gastro-esophageal reflux disease without esophagitis: Secondary | ICD-10-CM | POA: Diagnosis present

## 2016-06-10 DIAGNOSIS — Z96652 Presence of left artificial knee joint: Secondary | ICD-10-CM | POA: Diagnosis present

## 2016-06-10 LAB — URINE CULTURE: Culture: 90000 — AB

## 2016-06-10 LAB — COMPREHENSIVE METABOLIC PANEL
ALT: 14 U/L (ref 14–54)
AST: 14 U/L — ABNORMAL LOW (ref 15–41)
Albumin: 4.6 g/dL (ref 3.5–5.0)
Alkaline Phosphatase: 56 U/L (ref 38–126)
Anion gap: 9 (ref 5–15)
BUN: 6 mg/dL (ref 6–20)
CO2: 20 mmol/L — ABNORMAL LOW (ref 22–32)
Calcium: 9.6 mg/dL (ref 8.9–10.3)
Chloride: 106 mmol/L (ref 101–111)
Creatinine, Ser: 0.8 mg/dL (ref 0.44–1.00)
GFR calc Af Amer: >60 mL/min (ref >60)
GFR calc non Af Amer: >60 mL/min (ref >60)
Glucose, Bld: 118 mg/dL — ABNORMAL HIGH (ref 65–99)
Potassium: 3 mmol/L — ABNORMAL LOW (ref 3.5–5.1)
Sodium: 135 mmol/L (ref 135–145)
Total Bilirubin: 1.8 mg/dL — ABNORMAL HIGH (ref 0.3–1.2)
Total Protein: 8.3 g/dL — ABNORMAL HIGH (ref 6.5–8.1)

## 2016-06-10 LAB — CBC WITH DIFFERENTIAL/PLATELET
Basophils Absolute: 0 10*3/uL (ref 0.0–0.1)
Basophils Relative: 0 %
Eosinophils Absolute: 0.1 10*3/uL (ref 0.0–0.7)
Eosinophils Relative: 1 %
HCT: 40.3 % (ref 36.0–46.0)
Hemoglobin: 13.9 g/dL (ref 12.0–15.0)
Lymphocytes Relative: 14 %
Lymphs Abs: 1.1 10*3/uL (ref 0.7–4.0)
MCH: 31.7 pg (ref 26.0–34.0)
MCHC: 34.5 g/dL (ref 30.0–36.0)
MCV: 92 fL (ref 78.0–100.0)
Monocytes Absolute: 0.5 10*3/uL (ref 0.1–1.0)
Monocytes Relative: 6 %
Neutro Abs: 6.6 10*3/uL (ref 1.7–7.7)
Neutrophils Relative %: 79 %
Platelets: 189 10*3/uL (ref 150–400)
RBC: 4.38 MIL/uL (ref 3.87–5.11)
RDW: 14.7 % (ref 11.5–15.5)
WBC: 8.3 10*3/uL (ref 4.0–10.5)

## 2016-06-10 LAB — RAPID URINE DRUG SCREEN, HOSP PERFORMED
Amphetamines: NOT DETECTED
Barbiturates: NOT DETECTED
Benzodiazepines: NOT DETECTED
Cocaine: NOT DETECTED
Opiates: POSITIVE — AB
Tetrahydrocannabinol: POSITIVE — AB

## 2016-06-10 LAB — URINE MICROSCOPIC-ADD ON
Bacteria, UA: NONE SEEN
WBC, UA: NONE SEEN WBC/hpf (ref 0–5)

## 2016-06-10 LAB — URINALYSIS, ROUTINE W REFLEX MICROSCOPIC
Bilirubin Urine: NEGATIVE
Glucose, UA: NEGATIVE mg/dL
Leukocytes, UA: NEGATIVE
Nitrite: NEGATIVE
Protein, ur: NEGATIVE mg/dL
Specific Gravity, Urine: 1.02 (ref 1.005–1.030)
pH: 5.5 (ref 5.0–8.0)

## 2016-06-10 LAB — LIPASE, BLOOD: Lipase: 29 U/L (ref 11–51)

## 2016-06-10 LAB — MAGNESIUM: Magnesium: 2.1 mg/dL (ref 1.7–2.4)

## 2016-06-10 LAB — PHOSPHORUS: Phosphorus: 2.4 mg/dL — ABNORMAL LOW (ref 2.5–4.6)

## 2016-06-10 LAB — TROPONIN I: Troponin I: <0.03 ng/mL (ref <0.03)

## 2016-06-10 MED ORDER — ONDANSETRON HCL 4 MG/2ML IJ SOLN
8.0000 mg | Freq: Once | INTRAMUSCULAR | Status: AC
  Administered 2016-06-10: 8 mg via INTRAVENOUS
  Filled 2016-06-10: qty 4

## 2016-06-10 MED ORDER — HALOPERIDOL LACTATE 5 MG/ML IJ SOLN
2.0000 mg | Freq: Once | INTRAMUSCULAR | Status: AC
  Administered 2016-06-10: 2 mg via INTRAVENOUS
  Filled 2016-06-10: qty 1

## 2016-06-10 MED ORDER — ONDANSETRON HCL 4 MG/2ML IJ SOLN
4.0000 mg | Freq: Four times a day (QID) | INTRAMUSCULAR | Status: DC | PRN
  Administered 2016-06-11 – 2016-06-12 (×3): 4 mg via INTRAVENOUS
  Filled 2016-06-10 (×3): qty 2

## 2016-06-10 MED ORDER — MORPHINE SULFATE (PF) 4 MG/ML IV SOLN
4.0000 mg | Freq: Once | INTRAVENOUS | Status: AC
  Administered 2016-06-10: 4 mg via INTRAVENOUS
  Filled 2016-06-10: qty 1

## 2016-06-10 MED ORDER — CYCLOBENZAPRINE HCL 10 MG PO TABS
10.0000 mg | ORAL_TABLET | Freq: Three times a day (TID) | ORAL | Status: DC | PRN
  Administered 2016-06-11: 10 mg via ORAL
  Filled 2016-06-10 (×2): qty 1

## 2016-06-10 MED ORDER — KCL IN DEXTROSE-NACL 20-5-0.45 MEQ/L-%-% IV SOLN
INTRAVENOUS | Status: DC
  Administered 2016-06-10 – 2016-06-12 (×6): via INTRAVENOUS

## 2016-06-10 MED ORDER — IBUPROFEN 400 MG PO TABS
400.0000 mg | ORAL_TABLET | Freq: Once | ORAL | Status: AC
  Administered 2016-06-10: 400 mg via ORAL
  Filled 2016-06-10: qty 1

## 2016-06-10 MED ORDER — AMLODIPINE BESYLATE 5 MG PO TABS
10.0000 mg | ORAL_TABLET | Freq: Every day | ORAL | Status: DC
  Administered 2016-06-10 – 2016-06-13 (×4): 10 mg via ORAL
  Filled 2016-06-10 (×4): qty 2

## 2016-06-10 MED ORDER — MOMETASONE FURO-FORMOTEROL FUM 200-5 MCG/ACT IN AERO
2.0000 | INHALATION_SPRAY | Freq: Two times a day (BID) | RESPIRATORY_TRACT | Status: DC
  Administered 2016-06-10 – 2016-06-13 (×6): 2 via RESPIRATORY_TRACT
  Filled 2016-06-10: qty 8.8

## 2016-06-10 MED ORDER — PROMETHAZINE HCL 25 MG/ML IJ SOLN
25.0000 mg | Freq: Once | INTRAMUSCULAR | Status: AC
  Administered 2016-06-10: 25 mg via INTRAVENOUS
  Filled 2016-06-10: qty 1

## 2016-06-10 MED ORDER — OXYCODONE HCL 5 MG PO TABS
5.0000 mg | ORAL_TABLET | ORAL | Status: DC | PRN
  Administered 2016-06-10 – 2016-06-13 (×9): 5 mg via ORAL
  Filled 2016-06-10 (×10): qty 1

## 2016-06-10 MED ORDER — SODIUM CHLORIDE 0.9 % IV BOLUS (SEPSIS)
1000.0000 mL | Freq: Once | INTRAVENOUS | Status: AC
  Administered 2016-06-10: 1000 mL via INTRAVENOUS

## 2016-06-10 MED ORDER — PROMETHAZINE HCL 25 MG PO TABS: 25.0000 mg | ORAL_TABLET | Freq: Four times a day (QID) | ORAL | 0 refills | Status: DC | PRN

## 2016-06-10 MED ORDER — LISINOPRIL 10 MG PO TABS
20.0000 mg | ORAL_TABLET | Freq: Every day | ORAL | Status: DC
  Administered 2016-06-10 – 2016-06-13 (×4): 20 mg via ORAL
  Filled 2016-06-10 (×4): qty 2

## 2016-06-10 MED ORDER — HEPARIN SODIUM (PORCINE) 5000 UNIT/ML IJ SOLN
5000.0000 [IU] | Freq: Three times a day (TID) | INTRAMUSCULAR | Status: DC
  Administered 2016-06-10 – 2016-06-13 (×9): 5000 [IU] via SUBCUTANEOUS
  Filled 2016-06-10 (×9): qty 1

## 2016-06-10 MED ORDER — ASPIRIN EC 325 MG PO TBEC
325.0000 mg | DELAYED_RELEASE_TABLET | Freq: Every day | ORAL | Status: DC
  Administered 2016-06-10 – 2016-06-13 (×4): 325 mg via ORAL
  Filled 2016-06-10 (×4): qty 1

## 2016-06-10 MED ORDER — POTASSIUM CHLORIDE CRYS ER 20 MEQ PO TBCR
40.0000 meq | EXTENDED_RELEASE_TABLET | Freq: Once | ORAL | Status: AC
  Administered 2016-06-10: 40 meq via ORAL
  Filled 2016-06-10: qty 2

## 2016-06-10 MED ORDER — ALBUTEROL SULFATE (2.5 MG/3ML) 0.083% IN NEBU
2.5000 mg | INHALATION_SOLUTION | Freq: Every day | RESPIRATORY_TRACT | Status: DC | PRN
Start: 2016-06-10 — End: 2016-06-13

## 2016-06-10 MED ORDER — MORPHINE SULFATE (PF) 2 MG/ML IV SOLN
1.0000 mg | INTRAVENOUS | Status: DC | PRN
  Administered 2016-06-10 – 2016-06-13 (×13): 1 mg via INTRAVENOUS
  Filled 2016-06-10 (×13): qty 1

## 2016-06-10 MED ORDER — ONDANSETRON HCL 4 MG/2ML IJ SOLN
4.0000 mg | Freq: Once | INTRAMUSCULAR | Status: DC
  Filled 2016-06-10: qty 2

## 2016-06-10 MED ORDER — ONDANSETRON HCL 4 MG PO TABS
4.0000 mg | ORAL_TABLET | Freq: Four times a day (QID) | ORAL | Status: DC | PRN
  Administered 2016-06-10: 4 mg via ORAL
  Filled 2016-06-10: qty 1

## 2016-06-10 MED ORDER — PANTOPRAZOLE SODIUM 40 MG PO TBEC
40.0000 mg | DELAYED_RELEASE_TABLET | Freq: Every day | ORAL | Status: DC
  Administered 2016-06-10 – 2016-06-13 (×4): 40 mg via ORAL
  Filled 2016-06-10 (×5): qty 1

## 2016-06-10 NOTE — Progress Notes (Signed)
Patient arrived to unit. VSS. Patient is in no distress/pain. Order/chart reviewed. RN will continue to monitor. MD notified regarding pt arriving to room. Unable to obtain IV. ICU notified to have [place] for IV. Oswald Hillock, RN

## 2016-06-10 NOTE — H&P (Addendum)
History and Physical    Colleen Lee HQP:[id number] DOB: 1956-02-18 DOA: 06/10/2016  PCP: Avon Gully, MD  Patient coming from: Home  Chief Complaint: n,v,d  HPI: Colleen Lee is a 60 y.o. female with medical history listed below. Patient was recently admitted for nausea vomiting diarrhea. She was released but states that her poor oral intake has persisted and that her nausea vomiting and diarrhea has gotten worse. As a result she presented again for further evaluation recommendations. Patient is a direct admit from office secondary to poor oral intake. She states that the problem has been present on and off for 2 months now. Nothing she is aware of makes it better. Eating makes it worse. The problem has been persistent and gradually getting worse. She does also admit to having some diarrhea but denies any blood in stool.   Review of Systems: As per HPI otherwise 10 point review of systems negative.    Past Medical History:  Diagnosis Date  . Asthma   . Chronic back pain   . Cirrhosis (HCC)    Metavir score F4 on elastography 2015  . Fibroids   . Fibromyalgia   . GERD (gastroesophageal reflux disease)   . H. pylori infection 2014   treated with pylera, had to be treated again as it was not eradicated. Urea breath test then negative after subsequent treatment.   . [name]    HCV RNA positive 09/2012  . Hypertension   . PONV (postoperative nausea and vomiting)    pt thinks maybe once she had N&V  . Sciatica of left side     Past Surgical History:  Procedure Laterality Date  . COLONOSCOPY  01/18/2008   ZLD:[id number] rectum.  Long redundant colon, a diminutive sigmoid polyp status post cold biopsy removed. Hyperplastic polyp. Repeat colonoscopy June 2014 due to family history of colon cancer  . COLONOSCOPY WITH ESOPHAGOGASTRODUODENOSCOPY (EGD) N/A 11/02/2012   [id number] gastric mucosa of doubtful, +H.pylori. Incomplete colonoscopy due to patient unable to tolerate exam,  proximal colon seen. Patient refused ACBE.  Marland Kitchen COLONOSCOPY WITH PROPOFOL N/A 03/08/2013   Dr. Jena Gauss: colonic polyp-removed as scribed above. Internal Hemorrhoids. Pathology did not reveal any colonic tissue, only mucus. SURVEILLANCE DUE Aug 2019  . ESOPHAGOGASTRODUODENOSCOPY  01/18/2008   RMR: Normal esophagus, normal  stomach  . ESOPHAGOGASTRODUODENOSCOPY (EGD) WITH PROPOFOL N/A 02/09/2016   Procedure: ESOPHAGOGASTRODUODENOSCOPY (EGD) WITH PROPOFOL;  Surgeon: Corbin Ade, MD;  Location: [facility];  Service: Endoscopy;  Laterality: N/A;  730   . HYSTEROSCOPY  05/11/2016   Procedure: HYSTEROSCOPY;  Surgeon: Tilda Burrow, MD;  Location: [facility];  Service: Gynecology;;  . KNEE SURGERY     left knee  . MULTIPLE EXTRACTIONS WITH ALVEOLOPLASTY N/A 10/15/2013   Procedure: MULTIPLE EXTRACTION WITH ALVEOLOPLASTY;  Surgeon: Georgia Lopes, DDS;  Location: [facility];  Service: Oral Surgery;  Laterality: N/A;  . POLYPECTOMY N/A 03/08/2013   Procedure: POLYPECTOMY;  Surgeon: Corbin Ade, MD;  Location: [facility];  Service: Endoscopy;  Laterality: N/A;  . POLYPECTOMY N/A 05/11/2016   Procedure: REMOVAL OF ENDOMETRIAL POLYP;  Surgeon: Tilda Burrow, MD;  Location: [facility];  Service: Gynecology;  Laterality: N/A;  . TOTAL KNEE ARTHROPLASTY Left 02/05/2015   Procedure: LEFT TOTAL KNEE ARTHROPLASTY;  Surgeon: Vickki Hearing, MD;  Location: [facility];  Service: Orthopedics;  Laterality: Left;     reports that she has been smoking Cigarettes.  She has a 40.00 pack-year smoking history. She has never  used smokeless tobacco. She reports that she does not drink alcohol or use drugs.  Allergies  Allergen Reactions  . Penicillins Rash    Has patient had a PCN reaction causing immediate rash, facial/tongue/throat swelling, SOB or lightheadedness with hypotension: Yes Has patient had a PCN reaction causing severe rash involving mucus membranes or skin necrosis: No Has patient had a PCN reaction that required  hospitalization No Has patient had a PCN reaction occurring within the last 10 years: No If all of the above answers are "NO", then may proceed with Cephalosporin use.     Family History  Problem Relation Age of Onset  . Colon cancer Brother 75       . Multiple myeloma Brother   . Liver cancer Sister   . Prostate cancer Brother   . Pancreatic cancer Brother   . Cancer Mother     breast  . Asthma Mother     Prior to Admission medications   Medication Sig Start Date End Date Taking? Authorizing Provider  amLODipine (NORVASC) 10 MG tablet Take 1 tablet by mouth daily.  08/07/15   Historical Provider, MD  aspirin EC 325 MG EC tablet Take 1 tablet (325 mg total) by mouth 2 (two) times daily. Patient taking differently: Take 325 mg by mouth daily.  02/08/15   Carole Civil, MD  budesonide-formoterol [facility]) 160-4.5 MCG/ACT inhaler Inhale 2 puffs into the lungs 2 (two) times daily.    Historical Provider, MD  Cyanocobalamin (VITAMIN B 12 PO) Take 1 tablet by mouth daily.    Historical Provider, MD  cyclobenzaprine (FLEXERIL) 10 MG tablet Take 1 tablet (10 mg total) by mouth 3 (three) times daily. Patient taking differently: Take 10 mg by mouth 3 (three) times daily as needed for muscle spasms.  02/24/16   Carole Civil, MD  esomeprazole (NEXIUM) 40 MG capsule Take 1 capsule (40 mg total) by mouth every morning. 12/19/14   Annitta Needs, NP  HYDROcodone-acetaminophen (NORCO) 7.5-325 MG tablet Take 1 tablet by mouth every 8 (eight) hours as needed for moderate pain. 05/24/16   Carole Civil, MD  HYDROcodone-acetaminophen (NORCO) 7.5-325 MG tablet Take 1 tablet by mouth every 8 (eight) hours as needed for moderate pain. 06/09/16   Sinda Du, MD  lisinopril (PRINIVIL,ZESTRIL) 20 MG tablet Take 1 tablet by mouth daily. 09/25/15   Historical Provider, MD  megestrol (MEGACE) 40 MG tablet Take 40 mg by mouth daily.    Historical Provider, MD  ondansetron (ZOFRAN ODT) 4 MG  disintegrating tablet '4mg'$  ODT q4 hours prn nausea/vomit Patient taking differently: Take 4 mg by mouth every 4 (four) hours as needed for nausea or vomiting. '4mg'$  ODT q4 hours prn nausea/vomit 11/24/15   Milton Ferguson, MD  promethazine (PHENERGAN) 25 MG tablet Take 1 tablet (25 mg total) by mouth every 6 (six) hours as needed for nausea or vomiting. 06/10/16   Kristen N Ward, DO  VENTOLIN HFA 108 (90 Base) MCG/ACT inhaler Inhale 1 puff into the lungs daily as needed for shortness of breath. 05/20/16   Historical Provider, MD    Physical Exam: Vitals:   06/10/16 1127  BP: (!) 174/84  Pulse: 70  Resp: 20  Temp: 98.2 F (36.8 C)  SpO2: 100%  Height: '5\' 6"'$  (1.676 m)      Constitutional: NAD, calm, comfortable Vitals:   06/10/16 1127  BP: (!) 174/84  Pulse: 70  Resp: 20  Temp: 98.2 F (36.8 C)  SpO2: 100%  Height: 5'  6" (1.676 m)   Eyes: PERRL, lids and conjunctivae normal ENMT: Mucous membranes are moist. Posterior pharynx clear of any exudate or lesions.Normal dentition.  Neck: normal, supple, no masses, no thyromegaly Respiratory: clear to auscultation bilaterally, no wheezing, no crackles. Normal respiratory effort. No accessory muscle use.  Cardiovascular: Regular rate and rhythm, no murmurs / rubs / gallops. No extremity edema. 2+ pedal pulses. No carotid bruits.  Abdomen: generalized tenderness, no rebound tenderness, no masses palpated. No hepatosplenomegaly. Bowel sounds positive.  Musculoskeletal: no clubbing / cyanosis. No joint deformity upper and lower extremities. Good ROM, no contractures. Normal muscle tone.  Skin: no rashes, lesions, ulcers. No induration Neurologic: CN 3-12 grossly intact. Sensation intact. Marland Kitchen  Psychiatric: Normal judgment and insight. Alert and oriented x 3. Normal mood.    Labs on Admission: I have personally reviewed following labs and imaging studies  CBC:  Recent Labs Lab 06/08/16 1853 06/10/16 0327  WBC 8.6 8.3  NEUTROABS  --  6.6    HGB 14.9 13.9  HCT 43.0 40.3  MCV 91.7 92.0  PLT 208 161   Basic Metabolic Panel:  Recent Labs Lab 06/08/16 1853 06/09/16 0600 06/10/16 0327  NA 135 137 135  K 3.7 3.3* 3.0*  CL 106 110 106  CO2 19* 19* 20*  GLUCOSE 117* 105* 118*  BUN '6 8 6  '$ CREATININE 0.84 0.84 0.80  CALCIUM 10.3 9.6 9.6   GFR: Estimated Creatinine Clearance: 76.3 mL/min (by C-G formula based on SCr of 0.8 mg/dL). Liver Function Tests:  Recent Labs Lab 06/08/16 1853 06/10/16 0327  AST 18 14*  ALT 16 14  ALKPHOS 66 56  BILITOT 1.2 1.8*  PROT 8.9* 8.3*  ALBUMIN 4.8 4.6    Recent Labs Lab 06/08/16 1853 06/10/16 0327  LIPASE 19 29   No results for input(s): AMMONIA in the last 168 hours. Coagulation Profile: No results for input(s): INR, PROTIME in the last 168 hours. Cardiac Enzymes:  Recent Labs Lab 06/10/16 0327  TROPONINI <0.03   BNP (last 3 results) No results for input(s): PROBNP in the last 8760 hours. HbA1C: No results for input(s): HGBA1C in the last 72 hours. CBG:  Recent Labs Lab 06/09/16 0723  GLUCAP 115*   Lipid Profile: No results for input(s): CHOL, HDL, LDLCALC, TRIG, CHOLHDL, LDLDIRECT in the last 72 hours. Thyroid Function Tests: No results for input(s): TSH, T4TOTAL, FREET4, T3FREE, THYROIDAB in the last 72 hours. Anemia Panel: No results for input(s): VITAMINB12, FOLATE, FERRITIN, TIBC, IRON, RETICCTPCT in the last 72 hours. Urine analysis:    Component Value Date/Time   COLORURINE YELLOW 06/10/2016 0500   APPEARANCEUR CLEAR 06/10/2016 0500   LABSPEC 1.020 06/10/2016 0500   PHURINE 5.5 06/10/2016 0500   GLUCOSEU NEGATIVE 06/10/2016 0500   HGBUR TRACE (A) 06/10/2016 0500   BILIRUBINUR NEGATIVE 06/10/2016 0500   KETONESUR TRACE (A) 06/10/2016 0500   PROTEINUR NEGATIVE 06/10/2016 0500   UROBILINOGEN 0.2 04/01/2015 1140   NITRITE NEGATIVE 06/10/2016 0500   LEUKOCYTESUR NEGATIVE 06/10/2016 0500   Sepsis Labs:  !!!!!!!!!!!!!!!!!!!!!!!!!!!!!!!!!!!!!!!!!!!! '@LABRCNTIP'$ (procalcitonin:4,lacticidven:4) ) Recent Results (from the past 240 hour(s))  Urine culture     Status: Abnormal   Collection Time: 06/08/16  6:49 PM  Result Value Ref Range Status   Specimen Description URINE, CLEAN CATCH  Final   Special Requests NONE  Final   Culture 90,000 COLONIES/mL ESCHERICHIA COLI (A)  Final   Report Status 06/10/2016 FINAL  Final   Organism ID, Bacteria ESCHERICHIA COLI (A)  Final  Susceptibility   Escherichia coli - MIC*    AMPICILLIN <=2 SENSITIVE Sensitive     CEFAZOLIN <=4 SENSITIVE Sensitive     CEFTRIAXONE <=1 SENSITIVE Sensitive     CIPROFLOXACIN <=0.25 SENSITIVE Sensitive     GENTAMICIN <=1 SENSITIVE Sensitive     IMIPENEM <=0.25 SENSITIVE Sensitive     NITROFURANTOIN <=16 SENSITIVE Sensitive     TRIMETH/SULFA <=20 SENSITIVE Sensitive     AMPICILLIN/SULBACTAM <=2 SENSITIVE Sensitive     PIP/TAZO <=4 SENSITIVE Sensitive     Extended ESBL NEGATIVE Sensitive     * 90,000 COLONIES/mL ESCHERICHIA COLI     Radiological Exams on Admission: Dg Chest 2 View  Result Date: 06/08/2016 CLINICAL DATA:  60 y/o F; abdominal pain, nausea, vomiting, and diarrhea. EXAM: CHEST  2 VIEW COMPARISON:  03/27/2016 chest radiograph FINDINGS: Stable cardiac silhouette given patient rotation and technique. No focal consolidation. No pneumothorax or effusion. Mild degenerative changes of thoracic spine. IMPRESSION: No active cardiopulmonary disease. Electronically Signed   By: Kristine Garbe M.D.   On: 06/08/2016 19:51   Ct Abdomen Pelvis W Contrast  Result Date: 06/08/2016 CLINICAL DATA:  Nausea vomiting and diarrhea with abdominal pain EXAM: CT ABDOMEN AND PELVIS WITH CONTRAST TECHNIQUE: Multidetector CT imaging of the abdomen and pelvis was performed using the standard protocol following bolus administration of intravenous contrast. CONTRAST:  146m ISOVUE-300 IOPAMIDOL (ISOVUE-300) INJECTION 61%  COMPARISON:  11/24/2015 FINDINGS: Lower chest: Limited by mild respiratory motion artifact. No acute infiltrate or effusion. Heart size upper normal. No gross pericardial effusion at the lung bases. Hepatobiliary: No intra hepatic biliary dilatation. No focal hepatic abnormality. No calcified gallstones. Slightly enlarged extrahepatic common bile duct up to 8 mm, no definite calcified stones seen. Pancreas: No inflammatory changes. Mildly prominent proximal pancreatic duct as before. Spleen: Normal in size without focal abnormality. Adrenals/Urinary Tract: Mild thickening of the left adrenal gland without mass. Tiny sub cm hypodense lesions in the bilateral kidneys. These are likely stable. Bladder unremarkable. Stomach/Bowel: The stomach is nonenlarged. No small bowel dilatation. Questionable mild diffuse wall thickening of the colon but without significant inflammation in the adjacent fat. The appendix is visualized and is normal. Vascular/Lymphatic: Atherosclerotic vascular calcifications. No significantly enlarged abdominal or pelvic lymph nodes. Reproductive: Prominent uterus with calcified masses/presumed fibroids. Hypodense fluid or possible thickened endometrial stripe within the uterus. No adnexal masses. Other: No free air or free fluid. Small fatty containing umbilical hernia. Musculoskeletal: No acute osseous abnormality. IMPRESSION: 1. Questionable colon wall thickening versus underdistention, no significant inflammation in the fat surrounding the colon. Normal appendix. No evidence for bowel obstruction. 2. Stable slightly enlarged extrahepatic common bile duct and prominent pancreatic duct. 3. Prominent uterus with calcified masses/fibroids. Possible small amount of hypodense fluid within the uterine cavity burst is mildly thickened endometrium. Nonemergent pelvic ultrasound may be obtained for further evaluation. Electronically Signed   By: KDonavan FoilM.D.   On: 06/08/2016 21:19    EKG:  Independently reviewed. No ST elevations or depressions  Assessment/Plan Active Problems:   Acute infective gastroenteritis/ Nausea vomiting and diarrhea - obtain stool ova and parasite - GI pathogen panel - obtain HIV antibody eval - Reviewed prior labs prior to admission lipase and AST/ALT within normal limits    Asthma - stable continue prior to admission home medication regimen    Alcoholic cirrhosis of liver without ascites (HCC) - stable    Essential hypertension - continue lisinopril and amlodipine    GERD (gastroesophageal reflux disease)  - continue  PPI, may have to discontinue pending GI pathogen panel results.  DVT prophylaxis: Heparin Code Status: Full Family Communication: d/c with patient directly Disposition Plan: med surg Consults called: None, although tomorrow Dr. Legrand Rams to manage Admission status: obs   Velvet Bathe MD [facility] Pager [phone]  If 7PM-7AM, please contact night-coverage [email or web address] Password [email or web address]  06/10/2016, 1:08 PM   Addendum:  Pt unable to obtain IV access despite several tries. As such will place picc line order as patient will require IV therapies secondary to poor oral intake.

## 2016-06-10 NOTE — ED Provider Notes (Signed)
TIME SEEN: 2:55 AM  CHIEF COMPLAINT: Nausea, vomiting and diarrhea  HPI: Pt is a 60 y.o. female with history of cirrhosis, [name], hypertension, fibromyalgia, chronic back pain who presents to the emergency department several days of nausea, vomiting and diarrhea. Was just admitted to the hospital for intractable vomiting, diarrhea and thought to have gastroenteritis. CT scan showed no acute abnormality on admission. Was just discharged yesterday afternoon. Has been taking Zofran at home without relief. Reports diffuse abdominal soreness from vomiting. No fevers, chills, bloody stools, melena, vaginal bleeding or discharge, dysuria or hematuria. No hematemesis.  ROS: See HPI Constitutional: no fever  Eyes: no drainage  ENT: no runny nose   Cardiovascular:  no chest pain  Resp: no SOB  GI:  vomiting GU: no dysuria Integumentary: no rash  Allergy: no hives  Musculoskeletal: no leg swelling  Neurological: no slurred speech ROS otherwise negative  PAST MEDICAL HISTORY/PAST SURGICAL HISTORY:  Past Medical History:  Diagnosis Date  . Asthma   . Chronic back pain   . Cirrhosis ([place])    Metavir score F4 on elastography 2015  . Fibroids   . Fibromyalgia   . GERD (gastroesophageal reflux disease)   . H. pylori infection 2014   treated with pylera, had to be treated again as it was not eradicated. Urea breath test then negative after subsequent treatment.   . [name]    HCV RNA positive 09/2012  . Hypertension   . PONV (postoperative nausea and vomiting)    pt thinks maybe once she had N&V  . Sciatica of left side     MEDICATIONS:  Prior to Admission medications   Medication Sig Start Date End Date Taking? Authorizing Provider  amLODipine (NORVASC) 10 MG tablet Take 1 tablet by mouth daily.  08/07/15   Historical Provider, MD  aspirin EC 325 MG EC tablet Take 1 tablet (325 mg total) by mouth 2 (two) times daily. Patient taking differently: Take 325 mg by mouth daily.  02/08/15    Carole Civil, MD  budesonide-formoterol [facility]) 160-4.5 MCG/ACT inhaler Inhale 2 puffs into the lungs 2 (two) times daily.    Historical Provider, MD  Cyanocobalamin (VITAMIN B 12 PO) Take 1 tablet by mouth daily.    Historical Provider, MD  cyclobenzaprine (FLEXERIL) 10 MG tablet Take 1 tablet (10 mg total) by mouth 3 (three) times daily. Patient taking differently: Take 10 mg by mouth 3 (three) times daily as needed for muscle spasms.  02/24/16   Carole Civil, MD  esomeprazole (NEXIUM) 40 MG capsule Take 1 capsule (40 mg total) by mouth every morning. 12/19/14   Annitta Needs, NP  HYDROcodone-acetaminophen (NORCO) 7.5-325 MG tablet Take 1 tablet by mouth every 8 (eight) hours as needed for moderate pain. 05/24/16   Carole Civil, MD  HYDROcodone-acetaminophen (NORCO) 7.5-325 MG tablet Take 1 tablet by mouth every 8 (eight) hours as needed for moderate pain. 06/09/16   Sinda Du, MD  lisinopril (PRINIVIL,ZESTRIL) 20 MG tablet Take 1 tablet by mouth daily. 09/25/15   Historical Provider, MD  megestrol (MEGACE) 40 MG tablet Take 40 mg by mouth daily.    Historical Provider, MD  ondansetron (ZOFRAN ODT) 4 MG disintegrating tablet '4mg'$  ODT q4 hours prn nausea/vomit Patient taking differently: Take 4 mg by mouth every 4 (four) hours as needed for nausea or vomiting. '4mg'$  ODT q4 hours prn nausea/vomit 11/24/15   Milton Ferguson, MD  VENTOLIN HFA 108 [phone] Base) MCG/ACT inhaler Inhale 1 puff  into the lungs daily as needed for shortness of breath. 05/20/16   Historical Provider, MD    ALLERGIES:  Allergies  Allergen Reactions  . Penicillins Rash    Has patient had a PCN reaction causing immediate rash, facial/tongue/throat swelling, SOB or lightheadedness with hypotension: Yes Has patient had a PCN reaction causing severe rash involving mucus membranes or skin necrosis: No Has patient had a PCN reaction that required hospitalization No Has patient had a PCN reaction occurring within the  last 10 years: No If all of the above answers are "NO", then may proceed with Cephalosporin use.     SOCIAL HISTORY:  Social History  Substance Use Topics  . Smoking status: Current Every Day Smoker    Packs/day: 1.00    Years: 40.00    Types: Cigarettes  . Smokeless tobacco: Never Used     Comment: Smokes one pack of cigarettes daily  . Alcohol use No     Comment: former, last Jan 2014    FAMILY HISTORY: Family History  Problem Relation Age of Onset  . Colon cancer Brother 44       . Multiple myeloma Brother   . Liver cancer Sister   . Prostate cancer Brother   . Pancreatic cancer Brother   . Cancer Mother     breast  . Asthma Mother     EXAM: BP 168/84 (BP Location: Right Arm)   Pulse 74   Temp 98.4 F (36.9 C) (Oral)   Resp (!) 40   Ht '5\' 6"'$  (1.676 m)   Wt 160 lb (72.6 kg)   SpO2 93%   BMI 25.82 kg/m  CONSTITUTIONAL: Alert and oriented and responds appropriately to questions. Chronically ill-appearing, does not appear significant leg dehydrated, nontoxic, actively vomiting liquid that is not bilious or bloody HEAD: Normocephalic EYES: Conjunctivae clear, PERRL, EOMI ENT: normal nose; no rhinorrhea; moist mucous membranes NECK: Supple, no meningismus, no nuchal rigidity, no LAD  CARD: RRR; S1 and S2 appreciated; no murmurs, no clicks, no rubs, no gallops RESP: Normal chest excursion without splinting patient is mildly tachypneic and hyperventilating; breath sounds clear and equal bilaterally; no wheezes, no rhonchi, no rales, no hypoxia or respiratory distress, speaking full sentences ABD/GI: Normal bowel sounds; non-distended; soft, mildly tender to palpation diffusely, no rebound, no guarding, no peritoneal signs, no hepatosplenomegaly BACK:  The back appears normal and is non-tender to palpation, there is no CVA tenderness EXT: Normal ROM in all joints; non-tender to palpation; no edema; normal capillary refill; no cyanosis, no calf tenderness or swelling     SKIN: Normal color for age and race; warm; no rash NEURO: Moves all extremities equally, sensation to light touch intact diffusely, cranial nerves II through XII intact, normal speech PSYCH: The patient's mood and manner are appropriate. Grooming and personal hygiene are appropriate.  MEDICAL DECISION MAKING: Patient here with vomiting. Was seen and admitted recently for the same. Had a CT scan which showed no acute abnormality. Reports her abdomen is sore from vomiting so much. She's also had some diarrhea. No fevers. Is nontoxic. We'll repeat labs today, urine. She does endorse smoking marijuana intermittently. Unable to tell me when she last smoked marijuana. EKG shows no new ischemic abnormality. We'll treat with Zofran, Phenergan and reassess. We'll give morphine for abdominal pain.  ED PROGRESS: Patient reports her nausea has improved with Zofran and Phenergan. Still having pain and some nausea. Given she has a history of marijuana abuse, will treat with Haldol as this  may be cyclic vomiting syndrome for marijuana. I do not feel she needs repeat imaging at this time. Labs show potassium of 3.0. We'll give oral replacement. Bicarbonate is slightly low at 20 with a normal anion gap. LFTs, lipase, white blood cell count normal.    [name] urinalysis shows trace hemoglobin and trace ketones but no other sign of infection. Urine drug screen positive for opiates and THC. Patient reports still having some pain with Haldol but has pain medication at home. I feel she is safe for discharge. She has Zofran at home and I will discharge her with a prescription for Phenergan. Have advised her to stop smoking marijuana as this could be contributing to her symptoms. Have discussed with her return precautions. She is repeatedly asking for narcotics for pain but at this time I do not feel they are indicated. I discussed with her that she can go home and take her pain medication as prescribed by her PCP. Have  recommended close follow-up with her primary care physician.    At this time, I do not feel there is any life-threatening condition present. I have reviewed and discussed all results (EKG, imaging, lab, urine as appropriate) and exam findings with patient/family. I have reviewed nursing notes and appropriate previous records.  I feel the patient is safe to be discharged home without further emergent workup and can continue workup as an outpatient as needed. Discussed usual and customary return precautions. Patient/family verbalize understanding and are comfortable with this plan.  Outpatient follow-up has been provided. All questions have been answered.     EKG Interpretation  Date/Time:  Thursday June 10 2016 03:26:50 EST Ventricular Rate:  64 PR Interval:    QRS Duration: 93 QT Interval:  453 QTC Calculation: 468 R Axis:   49 Text Interpretation:  Sinus rhythm Consider left ventricular hypertrophy No significant change since last tracing Confirmed by Navon Kotowski,  DO, Odetta Forness ([id number]) on 06/10/2016 4:06:03 AM        Angiocath insertion Performed by: Nyra Jabs  Consent: Verbal consent obtained. Risks and benefits: risks, benefits and alternatives were discussed Time out: Immediately prior to procedure a "time out" was called to verify the correct patient, procedure, equipment, support staff and site/side marked as required.  Preparation: Patient was prepped and draped in the usual sterile fashion.  Vein Location: right AC   Ultrasound Guided  Gauge: 22  Normal blood return and flush without difficulty Patient tolerance: Patient tolerated the procedure well with no immediate complications.       [place], DO 06/10/16 [phone]

## 2016-06-10 NOTE — ED Triage Notes (Signed)
Pt was admitted to this hospital for abd pain and vomiting, was discharged Wednesday at 2pm, states she was at home and continued to vomit despite taking zofran for same

## 2016-06-10 NOTE — Progress Notes (Signed)
IV attempted on patient multiple times with and without [place]. Contacted MD and verbal order for PICC placement.  VW called and awaiting line placement. Oswald Hillock, RN

## 2016-06-11 DIAGNOSIS — I10 Essential (primary) hypertension: Secondary | ICD-10-CM | POA: Diagnosis present

## 2016-06-11 DIAGNOSIS — Z880 Allergy status to penicillin: Secondary | ICD-10-CM | POA: Diagnosis not present

## 2016-06-11 DIAGNOSIS — Z7951 Long term (current) use of inhaled steroids: Secondary | ICD-10-CM | POA: Diagnosis not present

## 2016-06-11 DIAGNOSIS — Z79899 Other long term (current) drug therapy: Secondary | ICD-10-CM | POA: Diagnosis not present

## 2016-06-11 DIAGNOSIS — Z7982 Long term (current) use of aspirin: Secondary | ICD-10-CM | POA: Diagnosis not present

## 2016-06-11 DIAGNOSIS — J45909 Unspecified asthma, uncomplicated: Secondary | ICD-10-CM | POA: Diagnosis present

## 2016-06-11 DIAGNOSIS — N390 Urinary tract infection, site not specified: Secondary | ICD-10-CM | POA: Diagnosis present

## 2016-06-11 DIAGNOSIS — Z96652 Presence of left artificial knee joint: Secondary | ICD-10-CM | POA: Diagnosis present

## 2016-06-11 DIAGNOSIS — K7469 Other cirrhosis of liver: Secondary | ICD-10-CM | POA: Diagnosis not present

## 2016-06-11 DIAGNOSIS — K219 Gastro-esophageal reflux disease without esophagitis: Secondary | ICD-10-CM | POA: Diagnosis present

## 2016-06-11 DIAGNOSIS — F17210 Nicotine dependence, cigarettes, uncomplicated: Secondary | ICD-10-CM | POA: Diagnosis present

## 2016-06-11 DIAGNOSIS — M549 Dorsalgia, unspecified: Secondary | ICD-10-CM | POA: Diagnosis present

## 2016-06-11 DIAGNOSIS — A09 Infectious gastroenteritis and colitis, unspecified: Secondary | ICD-10-CM | POA: Diagnosis present

## 2016-06-11 DIAGNOSIS — M797 Fibromyalgia: Secondary | ICD-10-CM | POA: Diagnosis present

## 2016-06-11 DIAGNOSIS — B9620 Unspecified Escherichia coli [E. coli] as the cause of diseases classified elsewhere: Secondary | ICD-10-CM | POA: Diagnosis present

## 2016-06-11 DIAGNOSIS — K7030 Alcoholic cirrhosis of liver without ascites: Secondary | ICD-10-CM | POA: Diagnosis present

## 2016-06-11 DIAGNOSIS — R112 Nausea with vomiting, unspecified: Secondary | ICD-10-CM | POA: Diagnosis not present

## 2016-06-11 DIAGNOSIS — G8929 Other chronic pain: Secondary | ICD-10-CM | POA: Diagnosis present

## 2016-06-11 DIAGNOSIS — R197 Diarrhea, unspecified: Secondary | ICD-10-CM | POA: Diagnosis not present

## 2016-06-11 LAB — CBC
HCT: 41.5 % (ref 36.0–46.0)
Hemoglobin: 14.3 g/dL (ref 12.0–15.0)
MCH: 31.5 pg (ref 26.0–34.0)
MCHC: 34.5 g/dL (ref 30.0–36.0)
MCV: 91.4 fL (ref 78.0–100.0)
Platelets: 194 10*3/uL (ref 150–400)
RBC: 4.54 MIL/uL (ref 3.87–5.11)
RDW: 14.5 % (ref 11.5–15.5)
WBC: 8.7 10*3/uL (ref 4.0–10.5)

## 2016-06-11 LAB — HIV ANTIBODY (ROUTINE TESTING W REFLEX): HIV Screen 4th Generation wRfx: NONREACTIVE

## 2016-06-11 LAB — BASIC METABOLIC PANEL
Anion gap: 7 (ref 5–15)
BUN: 7 mg/dL (ref 6–20)
CO2: 20 mmol/L — ABNORMAL LOW (ref 22–32)
Calcium: 9.6 mg/dL (ref 8.9–10.3)
Chloride: 106 mmol/L (ref 101–111)
Creatinine, Ser: 0.77 mg/dL (ref 0.44–1.00)
GFR calc Af Amer: >60 mL/min (ref >60)
GFR calc non Af Amer: >60 mL/min (ref >60)
Glucose, Bld: 125 mg/dL — ABNORMAL HIGH (ref 65–99)
Potassium: 3.3 mmol/L — ABNORMAL LOW (ref 3.5–5.1)
Sodium: 133 mmol/L — ABNORMAL LOW (ref 135–145)

## 2016-06-11 MED ORDER — POTASSIUM CHLORIDE CRYS ER 20 MEQ PO TBCR
20.0000 meq | EXTENDED_RELEASE_TABLET | Freq: Two times a day (BID) | ORAL | Status: DC
  Administered 2016-06-11 – 2016-06-13 (×5): 20 meq via ORAL
  Filled 2016-06-11 (×5): qty 1

## 2016-06-11 NOTE — Care Management Obs Status (Signed)
[place] NOTIFICATION   Patient Details  Name: JESSIA LUSCO MRN: [id number] Date of Birth: [birth year]   Medicare Observation Status Notification Given:  Yes    Abrar Bilton, Chauncey Reading, RN 06/11/2016, 11:52 AM

## 2016-06-11 NOTE — Progress Notes (Signed)
Subjective: She was brought back to the hospital yesterday. She had been discharged on the eighth after having had what seemed to be an acute gastrointestinal illness. She was feeling much better no more nausea and vomiting at that time and she also wanted to be discharged because her husband was having surgery. However after she was discharged she started having nausea vomiting and diarrhea again and call my office and was admitted. She says she feels better. Still has some nausea. No diarrhea now. She has been receiving IV fluids. Workup is underway  Objective: Vital signs in last 24 hours: Temp:  [98.2 F (36.8 C)-101.1 F (38.4 C)] 99.4 F (37.4 C) (11/10 0520) Pulse Rate:  [70-100] 100 (11/10 0520) Resp:  [19-20] 20 (11/10 0520) BP: (130-174)/(71-84) 162/71 (11/10 0520) SpO2:  [98 %-100 %] 100 % (11/10 0750) Weight change:  Last BM Date: 06/10/16  Intake/Output from previous day: 11/09 0701 - 11/10 0700 In: 1123.3 [I.V.:1123.3] Out: -   PHYSICAL EXAM General appearance: alert, cooperative and mild distress Resp: clear to auscultation bilaterally Cardio: regular rate and rhythm, S1, S2 normal, no murmur, click, rub or gallop GI: Her abdomen is minimally tender. I can feel her liver about 3 cm below the rib margin bowel sounds are present Extremities: extremities normal, atraumatic, no cyanosis or edema Mucous membranes are dry. Skin is warm and dry.  Lab Results:  Results for orders placed or performed during the hospital encounter of 06/10/16 (from the past 48 hour(s))  Magnesium     Status: None   Collection Time: 06/10/16  3:34 AM  Result Value Ref Range   Magnesium 2.1 1.7 - 2.4 mg/dL  Phosphorus     Status: Abnormal   Collection Time: 06/10/16  3:34 AM  Result Value Ref Range   Phosphorus 2.4 (L) 2.5 - 4.6 mg/dL  HIV antibody     Status: None   Collection Time: 06/10/16  3:34 AM  Result Value Ref Range   HIV Screen 4th Generation wRfx Non Reactive Non Reactive   Comment: (NOTE) Performed At: [facility] [address] [place], [place] [id number] Mila Homer MD [id number]   Basic metabolic panel     Status: Abnormal   Collection Time: 06/11/16  6:39 AM  Result Value Ref Range   Sodium 133 (L) 135 - 145 mmol/L   Potassium 3.3 (L) 3.5 - 5.1 mmol/L   Chloride 106 101 - 111 mmol/L   CO2 20 (L) 22 - 32 mmol/L   Glucose, Bld 125 (H) 65 - 99 mg/dL   BUN 7 6 - 20 mg/dL   Creatinine, Ser 9.09 0.44 - 1.00 mg/dL   Calcium 9.6 8.9 - 83.1 mg/dL   GFR calc non Af Amer >60 >60 mL/min   GFR calc Af Amer >60 >60 mL/min    Comment: (NOTE) The eGFR has been calculated using the CKD EPI equation. This calculation has not been validated in all clinical situations. eGFR's persistently <60 mL/min signify possible Chronic Kidney Disease.    Anion gap 7 5 - 15  CBC     Status: None   Collection Time: 06/11/16  6:39 AM  Result Value Ref Range   WBC 8.7 4.0 - 10.5 K/uL   RBC 4.54 3.87 - 5.11 MIL/uL   Hemoglobin 14.3 12.0 - 15.0 g/dL   HCT 80.1 52.7 - 07.0 %   MCV 91.4 78.0 - 100.0 fL   MCH 31.5 26.0 - 34.0 pg   MCHC 34.5 30.0 - 36.0  g/dL   RDW 14.5 11.5 - 15.5 %   Platelets 194 150 - 400 K/uL    ABGS No results for input(s): PHART, PO2ART, TCO2, HCO3 in the last 72 hours.  Invalid input(s): PCO2 CULTURES Recent Results (from the past 240 hour(s))  Urine culture     Status: Abnormal   Collection Time: 06/08/16  6:49 PM  Result Value Ref Range Status   Specimen Description URINE, CLEAN CATCH  Final   Special Requests NONE  Final   Culture 90,000 COLONIES/mL ESCHERICHIA COLI (A)  Final   Report Status 06/10/2016 FINAL  Final   Organism ID, Bacteria ESCHERICHIA COLI (A)  Final      Susceptibility   Escherichia coli - MIC*    AMPICILLIN <=2 SENSITIVE Sensitive     CEFAZOLIN <=4 SENSITIVE Sensitive     CEFTRIAXONE <=1 SENSITIVE Sensitive     CIPROFLOXACIN <=0.25 SENSITIVE Sensitive     GENTAMICIN <=1 SENSITIVE Sensitive      IMIPENEM <=0.25 SENSITIVE Sensitive     NITROFURANTOIN <=16 SENSITIVE Sensitive     TRIMETH/SULFA <=20 SENSITIVE Sensitive     AMPICILLIN/SULBACTAM <=2 SENSITIVE Sensitive     PIP/TAZO <=4 SENSITIVE Sensitive     Extended ESBL NEGATIVE Sensitive     * 90,000 COLONIES/mL ESCHERICHIA COLI   Studies/Results: No results found.  Medications:  Prior to Admission:  Prescriptions Prior to Admission  Medication Sig Dispense Refill Last Dose  . aspirin EC 325 MG EC tablet Take 1 tablet (325 mg total) by mouth 2 (two) times daily. (Patient taking differently: Take 325 mg by mouth daily. ) 60 tablet 0 06/08/2016 at Unknown time  . cyclobenzaprine (FLEXERIL) 10 MG tablet Take 1 tablet (10 mg total) by mouth 3 (three) times daily. (Patient taking differently: Take 10 mg by mouth 3 (three) times daily as needed for muscle spasms. ) 90 tablet 5 06/08/2016 at Unknown time  . esomeprazole (NEXIUM) 40 MG capsule Take 1 capsule (40 mg total) by mouth every morning. 10 capsule 0 06/08/2016 at Unknown time  . HYDROcodone-acetaminophen (NORCO) 7.5-325 MG tablet Take 1 tablet by mouth every 8 (eight) hours as needed for moderate pain. 30 tablet 0 06/08/2016 at Unknown time  . ondansetron (ZOFRAN ODT) 4 MG disintegrating tablet '4mg'$  ODT q4 hours prn nausea/vomit (Patient taking differently: Take 4 mg by mouth every 4 (four) hours as needed for nausea or vomiting. '4mg'$  ODT q4 hours prn nausea/vomit) 12 tablet 0 06/08/2016 at Unknown time  . Vitamin D, Ergocalciferol, (DRISDOL) 50000 units CAPS capsule Take 50,000 Units by mouth every 30 (thirty) days.   06/08/2016  . amLODipine (NORVASC) 10 MG tablet Take 1 tablet by mouth daily.    Not Taking at Unknown time  . budesonide-formoterol (SYMBICORT) 160-4.5 MCG/ACT inhaler Inhale 2 puffs into the lungs 2 (two) times daily.   06/08/2016  . Cyanocobalamin (VITAMIN B 12 PO) Take 1 tablet by mouth daily.   06/08/2016  . lisinopril (PRINIVIL,ZESTRIL) 20 MG tablet Take 1 tablet by mouth  daily.   06/08/2016  . megestrol (MEGACE) 40 MG tablet Take 40 mg by mouth daily.   06/08/2016  . promethazine (PHENERGAN) 25 MG tablet Take 1 tablet (25 mg total) by mouth every 6 (six) hours as needed for nausea or vomiting. (Patient not taking: Reported on 06/11/2016) 15 tablet 0 Not Taking at Unknown time  . VENTOLIN HFA 108 (90 Base) MCG/ACT inhaler Inhale 1 puff into the lungs daily as needed for shortness of breath.  06/08/2016   Scheduled: . amLODipine  10 mg Oral Daily  . aspirin  325 mg Oral Daily  . heparin  5,000 Units Subcutaneous Q8H  . lisinopril  20 mg Oral Daily  . mometasone-formoterol  2 puff Inhalation BID  . pantoprazole  40 mg Oral Daily   Continuous: . dextrose 5 % and 0.45 % NaCl with KCl 20 mEq/L 100 mL/hr at 06/11/16 0343   QTT:[id number], cyclobenzaprine, morphine injection, ondansetron **OR** ondansetron (ZOFRAN) IV, oxyCODONE  Assesment: She was admitted with nausea vomiting and diarrhea/acute infective gastroenteritis. Workup is underway. She feels better but she's not going to be able to go home yet considering the events earlier this week. Active Problems:   Asthma   Alcoholic cirrhosis of liver without ascites (HCC)   Acute infective gastroenteritis   Essential hypertension   GERD (gastroesophageal reflux disease)   Nausea vomiting and diarrhea    Plan: Continue treatments. Continue IV fluids. Her potassium is a little bit low. Replace potassium. Continue everything else.    LOS: 0 days   Sansa Alkema L 06/11/2016, 8:34 AM

## 2016-06-11 NOTE — Care Management Note (Signed)
Case Management Note  Patient Details  Name: Colleen Lee MRN: [id number] Date of Birth: Sep 12, 1955    Expected Discharge Date:   06/13/2016               Expected Discharge Plan:  Home/Self Care  In-House Referral:  NA  Discharge planning Services  CM Consult  Post Acute Care Choice:  NA Choice offered to:  NA  DME Arranged:    DME Agency:     HH Arranged:    [place] Agency:     Status of Service:  Completed, signed off  If discussed at [employer] of Stay Meetings, dates discussed:    Additional Comments: Patient recently discharged for same n/v/d. No CM needs.   Travonna Swindle, Chauncey Reading, RN 06/11/2016, 11:47 AM

## 2016-06-12 ENCOUNTER — Encounter (HOSPITAL_COMMUNITY): Payer: Self-pay

## 2016-06-12 LAB — BASIC METABOLIC PANEL
Anion gap: 4 — ABNORMAL LOW (ref 5–15)
BUN: 7 mg/dL (ref 6–20)
CO2: 19 mmol/L — ABNORMAL LOW (ref 22–32)
Calcium: 9.2 mg/dL (ref 8.9–10.3)
Chloride: 110 mmol/L (ref 101–111)
Creatinine, Ser: 0.79 mg/dL (ref 0.44–1.00)
GFR calc Af Amer: >60 mL/min (ref >60)
GFR calc non Af Amer: >60 mL/min (ref >60)
Glucose, Bld: 92 mg/dL (ref 65–99)
Potassium: 4 mmol/L (ref 3.5–5.1)
Sodium: 133 mmol/L — ABNORMAL LOW (ref 135–145)

## 2016-06-12 NOTE — Progress Notes (Signed)
Subjective: She was admitted to observation with abdominal discomfort and nausea vomiting and diarrhea. She says she feels better. She still had a loose stool today. No nausea or vomiting. She has tolerated liquids well and wants to have more food.  Objective: Vital signs in last 24 hours: Temp:  [99.5 F (37.5 C)-100.1 F (37.8 C)] 100.1 F (37.8 C) (11/11 0536) Pulse Rate:  [67-92] 67 (11/11 0536) Resp:  [16-20] 16 (11/11 0536) BP: (119-139)/(72-96) 121/76 (11/11 0536) SpO2:  [100 %] 100 % (11/11 0753) Weight:  [72.5 kg (159 lb 13.3 oz)] 72.5 kg (159 lb 13.3 oz) (11/11 0235) Weight change:  Last BM Date: 06/10/16  Intake/Output from previous day: 11/10 0701 - 11/11 0700 In: 1313.3 [P.O.:480; I.V.:833.3] Out: 1400 [Urine:1400]  PHYSICAL EXAM General appearance: alert, cooperative and mild distress Resp: clear to auscultation bilaterally Cardio: regular rate and rhythm, S1, S2 normal, no murmur, click, rub or gallop GI: Abdomen minimally tender. Her liver is palpable 2 cm below the costal margin Extremities: extremities normal, atraumatic, no cyanosis or edema Skin warm and dry. Mucous membranes are moist  Lab Results:  Results for orders placed or performed during the hospital encounter of 06/10/16 (from the past 48 hour(s))  Basic metabolic panel     Status: Abnormal   Collection Time: 06/11/16  6:39 AM  Result Value Ref Range   Sodium 133 (L) 135 - 145 mmol/L   Potassium 3.3 (L) 3.5 - 5.1 mmol/L   Chloride 106 101 - 111 mmol/L   CO2 20 (L) 22 - 32 mmol/L   Glucose, Bld 125 (H) 65 - 99 mg/dL   BUN 7 6 - 20 mg/dL   Creatinine, Ser 0.77 0.44 - 1.00 mg/dL   Calcium 9.6 8.9 - 10.3 mg/dL   GFR calc non Af Amer >60 >60 mL/min   GFR calc Af Amer >60 >60 mL/min    Comment: (NOTE) The eGFR has been calculated using the CKD EPI equation. This calculation has not been validated in all clinical situations. eGFR's persistently <60 mL/min signify possible Chronic  Kidney Disease.    Anion gap 7 5 - 15  CBC     Status: None   Collection Time: 06/11/16  6:39 AM  Result Value Ref Range   WBC 8.7 4.0 - 10.5 K/uL   RBC 4.54 3.87 - 5.11 MIL/uL   Hemoglobin 14.3 12.0 - 15.0 g/dL   HCT 41.5 36.0 - 46.0 %   MCV 91.4 78.0 - 100.0 fL   MCH 31.5 26.0 - 34.0 pg   MCHC 34.5 30.0 - 36.0 g/dL   RDW 14.5 11.5 - 15.5 %   Platelets 194 150 - 400 K/uL  Basic metabolic panel     Status: Abnormal   Collection Time: 06/12/16  6:25 AM  Result Value Ref Range   Sodium 133 (L) 135 - 145 mmol/L   Potassium 4.0 3.5 - 5.1 mmol/L    Comment: DELTA CHECK NOTED   Chloride 110 101 - 111 mmol/L   CO2 19 (L) 22 - 32 mmol/L   Glucose, Bld 92 65 - 99 mg/dL   BUN 7 6 - 20 mg/dL   Creatinine, Ser 0.79 0.44 - 1.00 mg/dL   Calcium 9.2 8.9 - 10.3 mg/dL   GFR calc non Af Amer >60 >60 mL/min   GFR calc Af Amer >60 >60 mL/min    Comment: (NOTE) The eGFR has been calculated using the CKD EPI equation. This calculation has not been validated in all clinical  situations. eGFR's persistently <60 mL/min signify possible Chronic Kidney Disease.    Anion gap 4 (L) 5 - 15    ABGS No results for input(s): PHART, PO2ART, TCO2, HCO3 in the last 72 hours.  Invalid input(s): PCO2 CULTURES Recent Results (from the past 240 hour(s))  Urine culture     Status: Abnormal   Collection Time: 06/08/16  6:49 PM  Result Value Ref Range Status   Specimen Description URINE, CLEAN CATCH  Final   Special Requests NONE  Final   Culture 90,000 COLONIES/mL ESCHERICHIA COLI (A)  Final   Report Status 06/10/2016 FINAL  Final   Organism ID, Bacteria ESCHERICHIA COLI (A)  Final      Susceptibility   Escherichia coli - MIC*    AMPICILLIN <=2 SENSITIVE Sensitive     CEFAZOLIN <=4 SENSITIVE Sensitive     CEFTRIAXONE <=1 SENSITIVE Sensitive     CIPROFLOXACIN <=0.25 SENSITIVE Sensitive     GENTAMICIN <=1 SENSITIVE Sensitive     IMIPENEM <=0.25 SENSITIVE Sensitive     NITROFURANTOIN <=16 SENSITIVE  Sensitive     TRIMETH/SULFA <=20 SENSITIVE Sensitive     AMPICILLIN/SULBACTAM <=2 SENSITIVE Sensitive     PIP/TAZO <=4 SENSITIVE Sensitive     Extended ESBL NEGATIVE Sensitive     * 90,000 COLONIES/mL ESCHERICHIA COLI   Studies/Results: No results found.  Medications:  Prior to Admission:  Prescriptions Prior to Admission  Medication Sig Dispense Refill Last Dose  . aspirin EC 325 MG EC tablet Take 1 tablet (325 mg total) by mouth 2 (two) times daily. (Patient taking differently: Take 325 mg by mouth daily. ) 60 tablet 0 06/08/2016 at Unknown time  . cyclobenzaprine (FLEXERIL) 10 MG tablet Take 1 tablet (10 mg total) by mouth 3 (three) times daily. (Patient taking differently: Take 10 mg by mouth 3 (three) times daily as needed for muscle spasms. ) 90 tablet 5 06/08/2016 at Unknown time  . esomeprazole (NEXIUM) 40 MG capsule Take 1 capsule (40 mg total) by mouth every morning. 10 capsule 0 06/08/2016 at Unknown time  . HYDROcodone-acetaminophen (NORCO) 7.5-325 MG tablet Take 1 tablet by mouth every 8 (eight) hours as needed for moderate pain. 30 tablet 0 06/08/2016 at Unknown time  . ondansetron (ZOFRAN ODT) 4 MG disintegrating tablet '4mg'$  ODT q4 hours prn nausea/vomit (Patient taking differently: Take 4 mg by mouth every 4 (four) hours as needed for nausea or vomiting. '4mg'$  ODT q4 hours prn nausea/vomit) 12 tablet 0 06/08/2016 at Unknown time  . Vitamin D, Ergocalciferol, (DRISDOL) 50000 units CAPS capsule Take 50,000 Units by mouth every 30 (thirty) days.   06/08/2016  . amLODipine (NORVASC) 10 MG tablet Take 1 tablet by mouth daily.    Not Taking at Unknown time  . budesonide-formoterol (SYMBICORT) 160-4.5 MCG/ACT inhaler Inhale 2 puffs into the lungs 2 (two) times daily.   06/08/2016  . Cyanocobalamin (VITAMIN B 12 PO) Take 1 tablet by mouth daily.   06/08/2016  . lisinopril (PRINIVIL,ZESTRIL) 20 MG tablet Take 1 tablet by mouth daily.   06/08/2016  . megestrol (MEGACE) 40 MG tablet Take 40 mg by  mouth daily.   06/08/2016  . promethazine (PHENERGAN) 25 MG tablet Take 1 tablet (25 mg total) by mouth every 6 (six) hours as needed for nausea or vomiting. (Patient not taking: Reported on 06/11/2016) 15 tablet 0 Not Taking at Unknown time  . VENTOLIN HFA 108 (90 Base) MCG/ACT inhaler Inhale 1 puff into the lungs daily as needed for shortness of  breath.   06/08/2016   Scheduled: . amLODipine  10 mg Oral Daily  . aspirin  325 mg Oral Daily  . heparin  5,000 Units Subcutaneous Q8H  . lisinopril  20 mg Oral Daily  . mometasone-formoterol  2 puff Inhalation BID  . pantoprazole  40 mg Oral Daily  . potassium chloride  20 mEq Oral BID   Continuous: . dextrose 5 % and 0.45 % NaCl with KCl 20 mEq/L 100 mL/hr at 06/12/16 0734   RFF:[id number], cyclobenzaprine, morphine injection, ondansetron **OR** ondansetron (ZOFRAN) IV, oxyCODONE  Assesment: She was admitted with presumed infective gastroenteritis. Her GI pathogen panel is pending but she is improving. She had been in the hospital in improved and wanted to go home because her husband was having surgery but she continued having symptoms and returned. She is better but I'm hesitant to try to discharge her at this point because of the previous episode.  At baseline she has cirrhosis of the liver. Active Problems:   Asthma   Alcoholic cirrhosis of liver without ascites (HCC)   Acute infective gastroenteritis   Essential hypertension   GERD (gastroesophageal reflux disease)   Nausea vomiting and diarrhea    Plan: Continue treatments. Advance her diet. Potential discharge in the next 48 hours    LOS: 1 day   Tylin Stradley L 06/12/2016, 10:45 AM

## 2016-06-13 DIAGNOSIS — K7469 Other cirrhosis of liver: Secondary | ICD-10-CM | POA: Diagnosis not present

## 2016-06-13 DIAGNOSIS — R112 Nausea with vomiting, unspecified: Secondary | ICD-10-CM | POA: Diagnosis not present

## 2016-06-13 DIAGNOSIS — R197 Diarrhea, unspecified: Secondary | ICD-10-CM | POA: Diagnosis not present

## 2016-06-13 LAB — GASTROINTESTINAL PANEL BY PCR, STOOL (REPLACES STOOL CULTURE)

## 2016-06-13 MED ORDER — ASPIRIN 325 MG PO TBEC: 325.0000 mg | DELAYED_RELEASE_TABLET | Freq: Every day | ORAL | 12 refills | Status: DC

## 2016-06-13 MED ORDER — CYCLOBENZAPRINE HCL 10 MG PO TABS: 10.0000 mg | ORAL_TABLET | Freq: Three times a day (TID) | ORAL | 0 refills | Status: DC | PRN

## 2016-06-13 MED ORDER — ONDANSETRON 4 MG PO TBDP: 4.0000 mg | ORAL_TABLET | ORAL | 1 refills | Status: DC | PRN

## 2016-06-13 MED ORDER — CIPROFLOXACIN HCL 250 MG PO TABS: 250.0000 mg | ORAL_TABLET | Freq: Two times a day (BID) | ORAL | 0 refills | Status: DC

## 2016-06-13 NOTE — Progress Notes (Signed)
Patient with orders to be discharge home. Discharge instructions given, patient verbalized understanding. Patient stable. Patient left in private vehicle with family.  

## 2016-06-13 NOTE — Progress Notes (Signed)
Subjective: She feels better and wants to go home. No new complaints. Her breathing is okay. No nausea vomiting or diarrhea. She was able to tolerate food.  Objective: Vital signs in last 24 hours: Temp:  [98.1 F (36.7 C)-98.9 F (37.2 C)] 98.3 F (36.8 C) (11/12 0557) Pulse Rate:  [67-75] 75 (11/12 0557) Resp:  [16] 16 (11/12 0557) BP: (96-112)/(59-75) 112/75 (11/12 0557) SpO2:  [100 %] 100 % (11/12 0913) Weight change:  Last BM Date: 06/12/16  Intake/Output from previous day: 11/11 0701 - 11/12 0700 In: 1800 [P.O.:600; I.V.:1200] Out: 1500 [Urine:1500]  PHYSICAL EXAM General appearance: alert, cooperative and no distress Resp: clear to auscultation bilaterally Cardio: regular rate and rhythm, S1, S2 normal, no murmur, click, rub or gallop GI: soft, non-tender; bowel sounds normal; no masses,  no organomegaly Extremities: extremities normal, atraumatic, no cyanosis or edema Skin warm and dry. Mucous membranes are moist  Lab Results:  Results for orders placed or performed during the hospital encounter of 06/10/16 (from the past 48 hour(s))  Basic metabolic panel     Status: Abnormal   Collection Time: 06/12/16  6:25 AM  Result Value Ref Range   Sodium 133 (L) 135 - 145 mmol/L   Potassium 4.0 3.5 - 5.1 mmol/L    Comment: DELTA CHECK NOTED   Chloride 110 101 - 111 mmol/L   CO2 19 (L) 22 - 32 mmol/L   Glucose, Bld 92 65 - 99 mg/dL   BUN 7 6 - 20 mg/dL   Creatinine, Ser 9.70 0.44 - 1.00 mg/dL   Calcium 9.2 8.9 - 44.9 mg/dL   GFR calc non Af Amer >60 >60 mL/min   GFR calc Af Amer >60 >60 mL/min    Comment: (NOTE) The eGFR has been calculated using the CKD EPI equation. This calculation has not been validated in all clinical situations. eGFR's persistently <60 mL/min signify possible Chronic Kidney Disease.    Anion gap 4 (L) 5 - 15    ABGS No results for input(s): PHART, PO2ART, TCO2, HCO3 in the last 72 hours.  Invalid input(s): PCO2 CULTURES Recent Results  (from the past 240 hour(s))  Urine culture     Status: Abnormal   Collection Time: 06/08/16  6:49 PM  Result Value Ref Range Status   Specimen Description URINE, CLEAN CATCH  Final   Special Requests NONE  Final   Culture 90,000 COLONIES/mL ESCHERICHIA COLI (A)  Final   Report Status 06/10/2016 FINAL  Final   Organism ID, Bacteria ESCHERICHIA COLI (A)  Final      Susceptibility   Escherichia coli - MIC*    AMPICILLIN <=2 SENSITIVE Sensitive     CEFAZOLIN <=4 SENSITIVE Sensitive     CEFTRIAXONE <=1 SENSITIVE Sensitive     CIPROFLOXACIN <=0.25 SENSITIVE Sensitive     GENTAMICIN <=1 SENSITIVE Sensitive     IMIPENEM <=0.25 SENSITIVE Sensitive     NITROFURANTOIN <=16 SENSITIVE Sensitive     TRIMETH/SULFA <=20 SENSITIVE Sensitive     AMPICILLIN/SULBACTAM <=2 SENSITIVE Sensitive     PIP/TAZO <=4 SENSITIVE Sensitive     Extended ESBL NEGATIVE Sensitive     * 90,000 COLONIES/mL ESCHERICHIA COLI   Studies/Results: No results found.  Medications:  Prior to Admission:  Prescriptions Prior to Admission  Medication Sig Dispense Refill Last Dose  . aspirin EC 325 MG EC tablet Take 1 tablet (325 mg total) by mouth 2 (two) times daily. (Patient taking differently: Take 325 mg by mouth daily. ) 60 tablet 0  06/08/2016 at Unknown time  . cyclobenzaprine (FLEXERIL) 10 MG tablet Take 1 tablet (10 mg total) by mouth 3 (three) times daily. (Patient taking differently: Take 10 mg by mouth 3 (three) times daily as needed for muscle spasms. ) 90 tablet 5 06/08/2016 at Unknown time  . esomeprazole (NEXIUM) 40 MG capsule Take 1 capsule (40 mg total) by mouth every morning. 10 capsule 0 06/08/2016 at Unknown time  . HYDROcodone-acetaminophen (NORCO) 7.5-325 MG tablet Take 1 tablet by mouth every 8 (eight) hours as needed for moderate pain. 30 tablet 0 06/08/2016 at Unknown time  . ondansetron (ZOFRAN ODT) 4 MG disintegrating tablet '4mg'$  ODT q4 hours prn nausea/vomit (Patient taking differently: Take 4 mg by mouth  every 4 (four) hours as needed for nausea or vomiting. '4mg'$  ODT q4 hours prn nausea/vomit) 12 tablet 0 06/08/2016 at Unknown time  . Vitamin D, Ergocalciferol, (DRISDOL) 50000 units CAPS capsule Take 50,000 Units by mouth every 30 (thirty) days.   06/08/2016  . amLODipine (NORVASC) 10 MG tablet Take 1 tablet by mouth daily.    Not Taking at Unknown time  . budesonide-formoterol (SYMBICORT) 160-4.5 MCG/ACT inhaler Inhale 2 puffs into the lungs 2 (two) times daily.   06/08/2016  . Cyanocobalamin (VITAMIN B 12 PO) Take 1 tablet by mouth daily.   06/08/2016  . lisinopril (PRINIVIL,ZESTRIL) 20 MG tablet Take 1 tablet by mouth daily.   06/08/2016  . megestrol (MEGACE) 40 MG tablet Take 40 mg by mouth daily.   06/08/2016  . promethazine (PHENERGAN) 25 MG tablet Take 1 tablet (25 mg total) by mouth every 6 (six) hours as needed for nausea or vomiting. (Patient not taking: Reported on 06/11/2016) 15 tablet 0 Not Taking at Unknown time  . VENTOLIN HFA 108 (90 Base) MCG/ACT inhaler Inhale 1 puff into the lungs daily as needed for shortness of breath.   06/08/2016   Scheduled: . amLODipine  10 mg Oral Daily  . aspirin  325 mg Oral Daily  . heparin  5,000 Units Subcutaneous Q8H  . lisinopril  20 mg Oral Daily  . mometasone-formoterol  2 puff Inhalation BID  . pantoprazole  40 mg Oral Daily  . potassium chloride  20 mEq Oral BID   Continuous: . dextrose 5 % and 0.45 % NaCl with KCl 20 mEq/L 100 mL/hr at 06/12/16 1753   ZOX:[id number], cyclobenzaprine, morphine injection, ondansetron **OR** ondansetron (ZOFRAN) IV, oxyCODONE  Assesment: She was admitted with nausea vomiting and diarrhea. She's felt to have acute infective gastroenteritis. She has cirrhosis of the liver at baseline. She was in the hospital and stayed for about 24 hours and was discharged because she was doing better but she then had more problems with nausea. She says she is much improved now. She wants to go home. Her urine grew 90,000 colonies on  her last admission and although this does not meet 100,000 colonies I'm going to go ahead and treat that since it may have caused some of her symptoms. Enteric pathogen panel is still pending but clinically she looks back to baseline Active Problems:   Asthma   Alcoholic cirrhosis of liver without ascites (HCC)   Acute infective gastroenteritis   Essential hypertension   GERD (gastroesophageal reflux disease)   Nausea vomiting and diarrhea    Plan: Discharge home    LOS: 2 days   Britni Driscoll L 06/13/2016, 9:53 AM

## 2016-06-13 NOTE — Discharge Summary (Signed)
Physician Discharge Summary  Patient ID: Colleen Lee MRN: [id number] DOB/AGE: 09/26/1955 60 y.o. Primary Care Physician:FANTA,TESFAYE, MD Admit date: 06/10/2016 Discharge date: 06/13/2016    Discharge Diagnoses:   Active Problems:   Asthma   Alcoholic cirrhosis of liver without ascites (HCC)   Acute infective gastroenteritis   Essential hypertension   GERD (gastroesophageal reflux disease)   Nausea vomiting and diarrhea Urinary tract infection    Medication List    TAKE these medications   amLODipine 10 MG tablet Commonly known as:  NORVASC Take 1 tablet by mouth daily.   aspirin 325 MG EC tablet Take 1 tablet (325 mg total) by mouth daily.   budesonide-formoterol 160-4.5 MCG/ACT inhaler Commonly known as:  SYMBICORT Inhale 2 puffs into the lungs 2 (two) times daily.   ciprofloxacin 250 MG tablet Commonly known as:  CIPRO Take 1 tablet (250 mg total) by mouth 2 (two) times daily.   cyclobenzaprine 10 MG tablet Commonly known as:  FLEXERIL Take 1 tablet (10 mg total) by mouth 3 (three) times daily as needed for muscle spasms.   esomeprazole 40 MG capsule Commonly known as:  NEXIUM Take 1 capsule (40 mg total) by mouth every morning.   HYDROcodone-acetaminophen 7.5-325 MG tablet Commonly known as:  NORCO Take 1 tablet by mouth every 8 (eight) hours as needed for moderate pain.   lisinopril 20 MG tablet Commonly known as:  PRINIVIL,ZESTRIL Take 1 tablet by mouth daily.   megestrol 40 MG tablet Commonly known as:  MEGACE Take 40 mg by mouth daily.   ondansetron 4 MG disintegrating tablet Commonly known as:  ZOFRAN ODT Take 1 tablet (4 mg total) by mouth every 4 (four) hours as needed for nausea or vomiting. 4mg  ODT q4 hours prn nausea/vomit What changed:  how much to take  how to take this  when to take this  reasons to take this   promethazine 25 MG tablet Commonly known as:  PHENERGAN Take 1 tablet (25 mg total) by mouth every 6 (six) hours  as needed for nausea or vomiting.   VENTOLIN HFA 108 (90 Base) MCG/ACT inhaler Generic drug:  albuterol Inhale 1 puff into the lungs daily as needed for shortness of breath.   VITAMIN B 12 PO Take 1 tablet by mouth daily.   Vitamin D (Ergocalciferol) 50000 units Caps capsule Commonly known as:  DRISDOL Take 50,000 Units by mouth every 30 (thirty) days.       Discharged Condition:Improved    Consults: None  Significant Diagnostic Studies: Dg Chest 2 View  Result Date: 06/08/2016 CLINICAL DATA:  60 y/o F; abdominal pain, nausea, vomiting, and diarrhea. EXAM: CHEST  2 VIEW COMPARISON:  03/27/2016 chest radiograph FINDINGS: Stable cardiac silhouette given patient rotation and technique. No focal consolidation. No pneumothorax or effusion. Mild degenerative changes of thoracic spine. IMPRESSION: No active cardiopulmonary disease. Electronically Signed   By: Mitzi Hansen M.D.   On: 06/08/2016 19:51   Ct Abdomen Pelvis W Contrast  Result Date: 06/08/2016 CLINICAL DATA:  Nausea vomiting and diarrhea with abdominal pain EXAM: CT ABDOMEN AND PELVIS WITH CONTRAST TECHNIQUE: Multidetector CT imaging of the abdomen and pelvis was performed using the standard protocol following bolus administration of intravenous contrast. CONTRAST:  ISOVUE-300 IOPAMIDOL (ISOVUE-300) INJECTION 61% COMPARISON:  11/24/2015 FINDINGS: Lower chest: Limited by mild respiratory motion artifact. No acute infiltrate or effusion. Heart size upper normal. No gross pericardial effusion at the lung bases. Hepatobiliary: No intra hepatic biliary dilatation. No focal hepatic abnormality.  No calcified gallstones. Slightly enlarged extrahepatic common bile duct up to 8 mm, no definite calcified stones seen. Pancreas: No inflammatory changes. Mildly prominent proximal pancreatic duct as before. Spleen: Normal in size without focal abnormality. Adrenals/Urinary Tract: Mild thickening of the left adrenal gland without  mass. Tiny sub cm hypodense lesions in the bilateral kidneys. These are likely stable. Bladder unremarkable. Stomach/Bowel: The stomach is nonenlarged. No small bowel dilatation. Questionable mild diffuse wall thickening of the colon but without significant inflammation in the adjacent fat. The appendix is visualized and is normal. Vascular/Lymphatic: Atherosclerotic vascular calcifications. No significantly enlarged abdominal or pelvic lymph nodes. Reproductive: Prominent uterus with calcified masses/presumed fibroids. Hypodense fluid or possible thickened endometrial stripe within the uterus. No adnexal masses. Other: No free air or free fluid. Small fatty containing umbilical hernia. Musculoskeletal: No acute osseous abnormality. IMPRESSION: 1. Questionable colon wall thickening versus underdistention, no significant inflammation in the fat surrounding the colon. Normal appendix. No evidence for bowel obstruction. 2. Stable slightly enlarged extrahepatic common bile duct and prominent pancreatic duct. 3. Prominent uterus with calcified masses/fibroids. Possible small amount of hypodense fluid within the uterine cavity burst is mildly thickened endometrium. Nonemergent pelvic ultrasound may be obtained for further evaluation. Electronically Signed   By: Jasmine Pang M.D.   On: 06/08/2016 21:19    Lab Results: Basic Metabolic Panel:  Recent Labs  96/04/54 0639 06/12/16 0625  NA 133* 133*  K 3.3* 4.0  CL 106 110  CO2 20* 19*  GLUCOSE 125* 92  BUN 7 7  CREATININE 0.77 0.79  CALCIUM 9.6 9.2   Liver Function Tests: No results for input(s): AST, ALT, ALKPHOS, BILITOT, PROT, ALBUMIN in the last 72 hours.   CBC:  Recent Labs  06/11/16 0639  WBC 8.7  HGB 14.3  HCT 41.5  MCV 91.4  PLT 194    Recent Results (from the past 240 hour(s))  Urine culture     Status: Abnormal   Collection Time: 06/08/16  6:49 PM  Result Value Ref Range Status   Specimen Description URINE, CLEAN CATCH  Final    Special Requests NONE  Final   Culture 90,000 COLONIES/mL ESCHERICHIA COLI (A)  Final   Report Status 06/10/2016 FINAL  Final   Organism ID, Bacteria ESCHERICHIA COLI (A)  Final      Susceptibility   Escherichia coli - MIC*    AMPICILLIN <=2 SENSITIVE Sensitive     CEFAZOLIN <=4 SENSITIVE Sensitive     CEFTRIAXONE <=1 SENSITIVE Sensitive     CIPROFLOXACIN <=0.25 SENSITIVE Sensitive     GENTAMICIN <=1 SENSITIVE Sensitive     IMIPENEM <=0.25 SENSITIVE Sensitive     NITROFURANTOIN <=16 SENSITIVE Sensitive     TRIMETH/SULFA <=20 SENSITIVE Sensitive     AMPICILLIN/SULBACTAM <=2 SENSITIVE Sensitive     PIP/TAZO <=4 SENSITIVE Sensitive     Extended ESBL NEGATIVE Sensitive     * 90,000 COLONIES/mL ESCHERICHIA COLI     Hospital Course: This is a 60 year old who had been in the hospital overnight with nausea and vomiting diarrhea. She improved and wanted to go home. She also wanted to go home because her husband was having surgery on the day of discharge. However although she was doing well when she left the hospital and was able to eat without nausea vomiting or diarrhea she started having trouble with it again after she was discharged. She came back to the hospital after calling my office and was brought in again. She was given IV fluids  had enteric pathogen panel drawn. She was found to have 90,000 colonies of Escherichia coli on urine culture that was done on her prior admission reported after she was discharged. She improved again. Her enteric pathogen panel is still pending but she's able to eat regular diet not having nausea vomiting or diarrhea and is ready for discharge. She is able to ambulate.  Discharge Exam: Blood pressure 112/75, pulse 75, temperature 98.3 F (36.8 C), temperature source Oral, resp. rate 16, height 5\' 6"  (1.676 m), weight 72.5 kg (159 lb 13.3 oz), SpO2 100 %. She is awake and alert. Abdomen is soft. She does have palpable liver 2 cm below the costal margin. No acute  distress  Disposition: Home. She will follow-up with her primary care physician Dr. Felecia Shelling. Her enteric pathogen panel is pending.  Discharge Instructions    Discharge patient    Complete by:  As directed         Signed: Norrine Ballester L   06/13/2016, 10:00 AM

## 2016-06-14 ENCOUNTER — Ambulatory Visit (INDEPENDENT_AMBULATORY_CARE_PROVIDER_SITE_OTHER): Payer: Commercial Managed Care - HMO | Admitting: Nurse Practitioner

## 2016-06-14 ENCOUNTER — Encounter: Payer: Self-pay | Admitting: Nurse Practitioner

## 2016-06-14 VITALS — BP 122/73 | HR 110 | Temp 97.0°F | Ht 66.0 in | Wt 169.8 lb

## 2016-06-14 DIAGNOSIS — K529 Noninfective gastroenteritis and colitis, unspecified: Secondary | ICD-10-CM | POA: Diagnosis not present

## 2016-06-14 DIAGNOSIS — K7460 Unspecified cirrhosis of liver: Secondary | ICD-10-CM

## 2016-06-14 NOTE — Assessment & Plan Note (Signed)
The patient was recently admitted briefly for nausea, vomiting, diarrhea. Likely gastroenteritis. Her symptoms have improved significantly since discharge a couple days ago. She currently uses Phenergan as needed. No GI symptoms currently. Has not required Phenergan today. Recommend she continue to monitor, call us with any worsening symptoms.

## 2016-06-14 NOTE — Progress Notes (Signed)
Referring Provider: Rosita Fire, MD Primary Care Physician:  Rosita Fire, MD Primary GI:  Dr. Gala Romney  Chief Complaint  Patient presents with  . Follow-up    HPI:   Colleen Lee is a 60 y.o. female who presents for postprocedure follow-up. Patient had an endoscopy on 02/09/2016 for for esophageal varices screening related to [name] and/or alcoholic cirrhosis status post eradication and documented SVR on 09/22/2015. At her last office 01/21/2016 visit she was doing well with apparent well compensated disease based on labs. Abdominal ultrasound for [facility] surveillance was not completed, however the patient did have a CT of the abdomen and pelvis in the emergency department 06/08/2016 which did not mention specific lesions. Hemoglobin did drop a little bit and a repeat CBC was ordered. This showed some improvement in her hemoglobin from 11.6 to 12.2 with a baseline of 14. Subsequent serial CBCs showed continued improvement and return to baseline 06/08/2016 with a hemoglobin of 14.9. At the time of her last abs her MELD was 6 and Child-Pugh A.  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 Recommended continue current medications and repeat screening endoscopy in 2 years, return for follow-up in 3 months.  Family history of colon ca, last TCS 2014 recommended 5 year repeat; is on recall.  Today she states she was recently admitted to the hospital with N/V, likely gastroenteritis. She states she feels significantly better today. She has been under a lot of stress due to husbands prostate CA and surgical resection; he is still in the hospital. Denies abdominal pain, N/V, hematochezia, melena, yellowing of skin/eyes, darkened urine, acute episodic confusion, generalized pruritis. Denies chest pain, dyspnea, dizziness, lightheadedness, syncope,  near syncope. Denies any other upper or lower GI symptoms.  Past Medical History:  Diagnosis Date  . Asthma   . Chronic back pain   . Cirrhosis ([place])    Metavir score F4 on elastography 2015  . Fibroids   . Fibromyalgia   . GERD (gastroesophageal reflux disease)   . H. pylori infection 2014   treated with pylera, had to be treated again as it was not eradicated. Urea breath test then negative after subsequent treatment.   . [name]    HCV RNA positive 09/2012  . Hypertension   . PONV (postoperative nausea and vomiting)    pt thinks maybe once she had N&V  . Sciatica of left side     Past Surgical History:  Procedure Laterality Date  . COLONOSCOPY  01/18/2008   PNT:IRWERX rectum.  Long redundant colon, a diminutive sigmoid polyp status post cold biopsy removed. Hyperplastic polyp. Repeat colonoscopy June 2014 due to family history of colon cancer  . COLONOSCOPY WITH ESOPHAGOGASTRODUODENOSCOPY (EGD) N/A 11/02/2012   VQM:GQQPYPPJ gastric mucosa of doubtful, +H.pylori. Incomplete colonoscopy due to patient unable to tolerate exam, proximal colon seen. Patient refused ACBE.  Marland Kitchen COLONOSCOPY WITH PROPOFOL N/A 03/08/2013   Dr. Gala Romney: colonic polyp-removed as scribed above. Internal Hemorrhoids. Pathology did not reveal any colonic tissue, only mucus. SURVEILLANCE DUE Aug 2019  . ESOPHAGOGASTRODUODENOSCOPY  01/18/2008   RMR: Normal esophagus, normal  stomach  . ESOPHAGOGASTRODUODENOSCOPY (EGD) WITH PROPOFOL N/A 02/09/2016   Procedure: ESOPHAGOGASTRODUODENOSCOPY (EGD) WITH PROPOFOL;  Surgeon: Daneil Dolin, MD;  Location: [facility];  Service: Endoscopy;  Laterality: N/A;  730   . HYSTEROSCOPY  05/11/2016   Procedure: HYSTEROSCOPY;  Surgeon: Jenny Reichmann  France Ravens, MD;  Location: [facility];  Service: Gynecology;;  . KNEE SURGERY     left knee  . MULTIPLE EXTRACTIONS WITH ALVEOLOPLASTY N/A 10/15/2013   Procedure: MULTIPLE EXTRACTION WITH ALVEOLOPLASTY;  Surgeon: Gae Bon, DDS;  Location: [facility];  Service: Oral Surgery;  Laterality: N/A;  . POLYPECTOMY N/A 03/08/2013   Procedure: POLYPECTOMY;  Surgeon: Daneil Dolin, MD;  Location: [facility];  Service: Endoscopy;  Laterality: N/A;  . POLYPECTOMY N/A 05/11/2016   Procedure: REMOVAL OF ENDOMETRIAL POLYP;  Surgeon: Jonnie Kind, MD;  Location: [facility];  Service: Gynecology;  Laterality: N/A;  . TOTAL KNEE ARTHROPLASTY Left 02/05/2015   Procedure: LEFT TOTAL KNEE ARTHROPLASTY;  Surgeon: Carole Civil, MD;  Location: [facility];  Service: Orthopedics;  Laterality: Left;    Current Outpatient Prescriptions  Medication Sig Dispense Refill  . amLODipine (NORVASC) 10 MG tablet Take 1 tablet by mouth daily.     Marland Kitchen aspirin 325 MG EC tablet Take 1 tablet (325 mg total) by mouth daily. 30 tablet 12  . budesonide-formoterol (SYMBICORT) 160-4.5 MCG/ACT inhaler Inhale 2 puffs into the lungs 2 (two) times daily.    . ciprofloxacin (CIPRO) 250 MG tablet Take 1 tablet (250 mg total) by mouth 2 (two) times daily. 10 tablet 0  . Cyanocobalamin (VITAMIN B 12 PO) Take 1 tablet by mouth daily.    . cyclobenzaprine (FLEXERIL) 10 MG tablet Take 1 tablet (10 mg total) by mouth 3 (three) times daily as needed for muscle spasms. 30 tablet 0  . esomeprazole (NEXIUM) 40 MG capsule Take 1 capsule (40 mg total) by mouth every morning. 10 capsule 0  . HYDROcodone-acetaminophen (NORCO) 7.5-325 MG tablet Take 1 tablet by mouth every 8 (eight) hours as needed for moderate pain. 30 tablet 0  . lisinopril (PRINIVIL,ZESTRIL) 20 MG tablet Take 1 tablet by mouth daily.    . megestrol (MEGACE) 40 MG tablet Take 40 mg by mouth daily.    . mometasone-formoterol (DULERA) 100-5 MCG/ACT AERO Inhale 2 puffs into the lungs 2 (two) times daily.    . ondansetron (ZOFRAN ODT) 4 MG disintegrating tablet Take 1 tablet (4 mg total) by mouth every 4 (four) hours as needed for nausea or vomiting. 39m ODT q4 hours prn nausea/vomit 30 tablet 1  . promethazine (PHENERGAN) 25 MG tablet Take 1  tablet (25 mg total) by mouth every 6 (six) hours as needed for nausea or vomiting. 15 tablet 0  . VENTOLIN HFA 108 (90 Base) MCG/ACT inhaler Inhale 1 puff into the lungs daily as needed for shortness of breath.    . Vitamin D, Ergocalciferol, (DRISDOL) 50000 units CAPS capsule Take 50,000 Units by mouth every 30 (thirty) days.     No current facility-administered medications for this visit.     Allergies as of 06/14/2016 - Review Complete 06/14/2016  Allergen Reaction Noted  . Penicillins Rash     Family History  Problem Relation Age of Onset  . Colon cancer Brother 560      . Multiple myeloma Brother   . Liver cancer Sister   . Prostate cancer Brother   . Pancreatic cancer Brother   . Cancer Mother     breast  . Asthma Mother     Social History   Social History  . Marital status: Married    Spouse name: N/A  . Number of children: 0  . Years of education: N/A   Occupational History  . umemployed  Social History Main Topics  . Smoking status: Current Every Day Smoker    Packs/day: 1.00    Years: 40.00    Types: Cigarettes  . Smokeless tobacco: Never Used     Comment: Smokes one pack of cigarettes daily  . Alcohol use No     Comment: former, last Jan 2014  . Drug use: No     Comment: history of marijuana in past  . Sexual activity: No   Other Topics Concern  . None   Social History Narrative  . None    Review of Systems: General: Negative for anorexia, weight loss, fever, chills, fatigue, weakness. ENT: Negative for hoarseness, difficulty swallowing. CV: Negative for chest pain, angina, palpitations, peripheral edema.  Respiratory: Negative for dyspnea at rest, cough, sputum, wheezing.  GI: See history of present illness. Derm: Negative for rash or itching.  Neuro: Negative memory loss, confusion.  Endo: Negative for unusual weight change.  Heme: Negative for bruising or bleeding. Allergy: Negative for rash or hives.   Physical Exam: BP 122/73    Pulse (!) 110   Temp 97 F (36.1 C) (Oral)   Ht _0  (1.676 m)   Wt 169 lb 12.8 oz (77 kg)   BMI 27.41 kg/m  General:   Alert and oriented. Pleasant and cooperative. Well-nourished and well-developed.  Head:  Normocephalic and atraumatic. Eyes:  Without icterus, sclera clear and conjunctiva pink.  Ears:  Normal auditory acuity. Cardiovascular:  S1, S2 present without murmurs appreciated. Extremities without clubbing or edema. Respiratory:  Clear to auscultation bilaterally. No wheezes, rales, or rhonchi. No distress.  Gastrointestinal:  +BS, rounded but soft, non-tender and non-distended. No HSM noted. No guarding or rebound. No masses appreciated.  Rectal:  Deferred  Musculoskalatal:  Symmetrical without gross deformities Neurologic:  Alert and oriented x4;  grossly normal neurologically. Psych:  Alert and cooperative. Normal mood and affect. Heme/Lymph/Immune: No excessive bruising noted.    06/14/2016 10:33 AM   Disclaimer: This note was dictated with voice recognition software. Similar sounding words can inadvertently be transcribed and may not be corrected upon review.

## 2016-06-14 NOTE — Assessment & Plan Note (Signed)
The patient has a history of cirrhosis, likely combination of past alcoholism as well as [name]. She is not had any alcohol in about 3 years in her [name] chronic infection has been eradicated with documented sustained viral response. Currently is not having any hepatic. Her labs and imaging are up-to-date. She is next due in January 2018. I will order CBC, CMP, PTT/INR, AFP in January 2018. I will also order repeat right upper quadrant ultrasound in January 2018. Return for follow-up in May 2018 (6 months) for routine cirrhosis care.

## 2016-06-14 NOTE — Patient Instructions (Signed)
1. Continue taking your current medications.  2. Your next set of labs and ultrasound are due in January 2018. I put the orders in the computer. 3. Call us if your symptoms worsen. 4. Return for follow-up in May 2018 for routine liver care. 5. Best of luck to your husband for a speedy recovery!!

## 2016-06-15 LAB — OVA + PARASITE EXAM

## 2016-06-15 LAB — O&P RESULT

## 2016-06-15 NOTE — Progress Notes (Signed)
CC'D TO PCP °

## 2016-06-17 LAB — STOOL CULTURE REFLEX - RSASHR

## 2016-06-17 LAB — STOOL CULTURE REFLEX - CMPCXR

## 2016-06-17 LAB — STOOL CULTURE: E coli, Shiga toxin Assay: NEGATIVE

## 2016-06-21 ENCOUNTER — Other Ambulatory Visit: Payer: Self-pay | Admitting: *Deleted

## 2016-06-21 ENCOUNTER — Telehealth: Payer: Self-pay | Admitting: Orthopedic Surgery

## 2016-06-21 DIAGNOSIS — M25562 Pain in left knee: Secondary | ICD-10-CM | POA: Diagnosis not present

## 2016-06-21 DIAGNOSIS — M549 Dorsalgia, unspecified: Secondary | ICD-10-CM | POA: Diagnosis not present

## 2016-06-21 DIAGNOSIS — N390 Urinary tract infection, site not specified: Secondary | ICD-10-CM | POA: Diagnosis not present

## 2016-06-21 DIAGNOSIS — R197 Diarrhea, unspecified: Secondary | ICD-10-CM | POA: Diagnosis not present

## 2016-06-21 MED ORDER — HYDROCODONE-ACETAMINOPHEN 7.5-325 MG PO TABS: 1.0000 | ORAL_TABLET | Freq: Three times a day (TID) | ORAL | 0 refills | Status: DC | PRN

## 2016-06-21 NOTE — Telephone Encounter (Signed)
Patient requests on Hydrocodone/Acetaminophen (Norco)  5-325 mgs.   Qty  15  Take 1 tablet by mouth every 6 (six) hours as needed for moderate pain.

## 2016-06-21 NOTE — Telephone Encounter (Signed)
QUANTITY 14 DAYS

## 2016-06-21 NOTE — Telephone Encounter (Signed)
Routing to Dr Harrison for approval 

## 2016-06-28 ENCOUNTER — Encounter: Payer: Self-pay | Admitting: Orthopedic Surgery

## 2016-06-28 ENCOUNTER — Ambulatory Visit (INDEPENDENT_AMBULATORY_CARE_PROVIDER_SITE_OTHER): Payer: Commercial Managed Care - HMO | Admitting: Orthopedic Surgery

## 2016-06-28 DIAGNOSIS — G894 Chronic pain syndrome: Secondary | ICD-10-CM | POA: Diagnosis not present

## 2016-06-28 NOTE — Patient Instructions (Signed)
CALL FOR PRESCRIPTION

## 2016-06-28 NOTE — Progress Notes (Signed)
Patient ID: Colleen Lee, female   DOB: 09/08/1955, 60 y.o.   MRN: [id number]  Chief Complaint  Patient presents with  . Follow-up    chronic pain    HPI Colleen Lee is a 60 y.o. female.   HPI 60 year old female with chronic pain presents for routine follow-up right lower extremity normal left lower extremity radiculopathy  Review of Systems Review of Systems Normal neuro  Denies fever  Examination There were no vitals taken for this visit.  Gen. appearance the patient's appearance is normal with normal grooming and  hygiene The patient is oriented to person place and time Mood and affect are normal   Ortho Exam Deferred  Medical decision-making Diagnosis, Data, Plan (risk)  [name] pain is worse however she does not want to have surgery  We will continue with hydrocodone 7.5 mg to control pain  Random drug screens will be done every 3 months or so  Follow-up 3 months  Arther Abbott, MD 06/28/2016 9:48 AM

## 2016-07-05 ENCOUNTER — Telehealth: Payer: Self-pay | Admitting: Orthopedic Surgery

## 2016-07-05 NOTE — Telephone Encounter (Signed)
Hydrocodone-Acetaminophen  7.5/325mg   Qty 42 Tablets  Take 1 tablet by mouth every 8 (eight) hours as needed for moderate pain.

## 2016-07-05 NOTE — Telephone Encounter (Signed)
Routing to Dr Harrison for approval 

## 2016-07-06 ENCOUNTER — Other Ambulatory Visit: Payer: Self-pay | Admitting: *Deleted

## 2016-07-06 MED ORDER — HYDROCODONE-ACETAMINOPHEN 7.5-325 MG PO TABS: 1.0000 | ORAL_TABLET | Freq: Three times a day (TID) | ORAL | 0 refills | Status: DC | PRN

## 2016-07-06 NOTE — Telephone Encounter (Signed)
YES

## 2016-07-13 ENCOUNTER — Telehealth: Payer: Self-pay | Admitting: Internal Medicine

## 2016-07-13 NOTE — Telephone Encounter (Signed)
Recall for ultrasound 

## 2016-07-13 NOTE — Telephone Encounter (Signed)
Letter mailed

## 2016-07-19 ENCOUNTER — Telehealth: Payer: Self-pay | Admitting: Orthopedic Surgery

## 2016-07-19 MED ORDER — HYDROCODONE-ACETAMINOPHEN 7.5-325 MG PO TABS: 1.0000 | ORAL_TABLET | Freq: Three times a day (TID) | ORAL | 0 refills | Status: DC | PRN

## 2016-07-19 NOTE — Telephone Encounter (Signed)
Routing to Dr Harrison for approval 

## 2016-07-19 NOTE — Telephone Encounter (Signed)
Hydrocodone-Acetaminophen 7.5/325mg   Qty 42 Tablets  Take 1 tablet by mouth every 8 (eight) hours as needed for moderate pain.

## 2016-07-20 ENCOUNTER — Ambulatory Visit: Payer: Commercial Managed Care - HMO | Admitting: Nurse Practitioner

## 2016-07-29 ENCOUNTER — Telehealth: Payer: Self-pay | Admitting: Orthopedic Surgery

## 2016-07-29 NOTE — Telephone Encounter (Signed)
ROUTING TO DR HARRISON FOR APPROVAL 

## 2016-07-29 NOTE — Telephone Encounter (Signed)
Patient requests refill on Hydrocodone/Acetaminophen  7.5-325  Mgs.   Qty  42       Sig: Take 1 tablet by mouth every 8 (eight) hours as needed for moderate pain.

## 2016-08-03 ENCOUNTER — Other Ambulatory Visit: Payer: Self-pay | Admitting: *Deleted

## 2016-08-03 MED ORDER — HYDROCODONE-ACETAMINOPHEN 7.5-325 MG PO TABS: 1.0000 | ORAL_TABLET | Freq: Three times a day (TID) | ORAL | 0 refills | Status: DC | PRN

## 2016-08-08 ENCOUNTER — Emergency Department (HOSPITAL_COMMUNITY)
Admission: EM | Admit: 2016-08-08 | Discharge: 2016-08-08 | Disposition: A | Discharge status: Home / Self Care | Payer: Medicare HMO | Attending: Emergency Medicine | Admitting: Emergency Medicine

## 2016-08-08 ENCOUNTER — Encounter (HOSPITAL_COMMUNITY): Payer: Self-pay | Admitting: Emergency Medicine

## 2016-08-08 ENCOUNTER — Emergency Department (HOSPITAL_COMMUNITY): Payer: Medicare HMO

## 2016-08-08 DIAGNOSIS — R112 Nausea with vomiting, unspecified: Secondary | ICD-10-CM | POA: Insufficient documentation

## 2016-08-08 DIAGNOSIS — J45909 Unspecified asthma, uncomplicated: Secondary | ICD-10-CM | POA: Diagnosis not present

## 2016-08-08 DIAGNOSIS — F17210 Nicotine dependence, cigarettes, uncomplicated: Secondary | ICD-10-CM | POA: Diagnosis not present

## 2016-08-08 DIAGNOSIS — R1084 Generalized abdominal pain: Secondary | ICD-10-CM | POA: Diagnosis not present

## 2016-08-08 DIAGNOSIS — Z7982 Long term (current) use of aspirin: Secondary | ICD-10-CM | POA: Insufficient documentation

## 2016-08-08 DIAGNOSIS — Z79899 Other long term (current) drug therapy: Secondary | ICD-10-CM | POA: Diagnosis not present

## 2016-08-08 DIAGNOSIS — R197 Diarrhea, unspecified: Secondary | ICD-10-CM | POA: Insufficient documentation

## 2016-08-08 DIAGNOSIS — R109 Unspecified abdominal pain: Secondary | ICD-10-CM | POA: Diagnosis not present

## 2016-08-08 DIAGNOSIS — R52 Pain, unspecified: Secondary | ICD-10-CM

## 2016-08-08 DIAGNOSIS — I10 Essential (primary) hypertension: Secondary | ICD-10-CM | POA: Insufficient documentation

## 2016-08-08 LAB — CBC WITH DIFFERENTIAL/PLATELET
Basophils Absolute: 0 10*3/uL (ref 0.0–0.1)
Basophils Relative: 0 %
Eosinophils Absolute: 0 10*3/uL (ref 0.0–0.7)
Eosinophils Relative: 0 %
HCT: 51.8 % — ABNORMAL HIGH (ref 36.0–46.0)
Hemoglobin: 17.9 g/dL — ABNORMAL HIGH (ref 12.0–15.0)
Lymphocytes Relative: 9 %
Lymphs Abs: 0.6 10*3/uL — ABNORMAL LOW (ref 0.7–4.0)
MCH: 32.1 pg (ref 26.0–34.0)
MCHC: 34.6 g/dL (ref 30.0–36.0)
MCV: 92.8 fL (ref 78.0–100.0)
Monocytes Absolute: 0.3 10*3/uL (ref 0.1–1.0)
Monocytes Relative: 4 %
Neutro Abs: 6.3 10*3/uL (ref 1.7–7.7)
Neutrophils Relative %: 87 %
Platelets: 186 10*3/uL (ref 150–400)
RBC: 5.58 MIL/uL — ABNORMAL HIGH (ref 3.87–5.11)
RDW: 13.9 % (ref 11.5–15.5)
WBC: 7.2 10*3/uL (ref 4.0–10.5)

## 2016-08-08 LAB — COMPREHENSIVE METABOLIC PANEL
ALT: 19 U/L (ref 14–54)
AST: 21 U/L (ref 15–41)
Albumin: 5.2 g/dL — ABNORMAL HIGH (ref 3.5–5.0)
Alkaline Phosphatase: 63 U/L (ref 38–126)
Anion gap: 12 (ref 5–15)
BUN: 9 mg/dL (ref 6–20)
CO2: 20 mmol/L — ABNORMAL LOW (ref 22–32)
Calcium: 10.8 mg/dL — ABNORMAL HIGH (ref 8.9–10.3)
Chloride: 102 mmol/L (ref 101–111)
Creatinine, Ser: 0.67 mg/dL (ref 0.44–1.00)
GFR calc Af Amer: >60 mL/min (ref >60)
GFR calc non Af Amer: >60 mL/min (ref >60)
Glucose, Bld: 106 mg/dL — ABNORMAL HIGH (ref 65–99)
Potassium: 3.9 mmol/L (ref 3.5–5.1)
Sodium: 134 mmol/L — ABNORMAL LOW (ref 135–145)
Total Bilirubin: 1.3 mg/dL — ABNORMAL HIGH (ref 0.3–1.2)
Total Protein: 9.7 g/dL — ABNORMAL HIGH (ref 6.5–8.1)

## 2016-08-08 LAB — URINALYSIS, ROUTINE W REFLEX MICROSCOPIC
Bilirubin Urine: NEGATIVE
Glucose, UA: NEGATIVE mg/dL
Ketones, ur: 20 mg/dL — AB
Leukocytes, UA: NEGATIVE
Nitrite: NEGATIVE
Protein, ur: >=300 mg/dL — AB
Specific Gravity, Urine: 1.018 (ref 1.005–1.030)
pH: 5 (ref 5.0–8.0)

## 2016-08-08 LAB — LIPASE, BLOOD: Lipase: 28 U/L (ref 11–51)

## 2016-08-08 MED ORDER — DICYCLOMINE HCL 20 MG PO TABS: 20.0000 mg | ORAL_TABLET | Freq: Two times a day (BID) | ORAL | 0 refills | Status: DC

## 2016-08-08 MED ORDER — SODIUM CHLORIDE 0.9 % IV SOLN: INTRAVENOUS | Status: DC

## 2016-08-08 MED ORDER — MORPHINE SULFATE (PF) 4 MG/ML IV SOLN
4.0000 mg | Freq: Once | INTRAVENOUS | Status: AC
  Administered 2016-08-08: 4 mg via INTRAMUSCULAR
  Filled 2016-08-08: qty 1

## 2016-08-08 MED ORDER — MORPHINE SULFATE (PF) 4 MG/ML IV SOLN: 4.0000 mg | Freq: Once | INTRAVENOUS | Status: DC

## 2016-08-08 MED ORDER — OXYCODONE-ACETAMINOPHEN 5-325 MG PO TABS
1.0000 | ORAL_TABLET | Freq: Once | ORAL | Status: AC
  Administered 2016-08-08: 1 via ORAL
  Filled 2016-08-08: qty 1

## 2016-08-08 MED ORDER — ONDANSETRON HCL 4 MG/2ML IJ SOLN
4.0000 mg | Freq: Once | INTRAMUSCULAR | Status: DC
  Filled 2016-08-08: qty 2

## 2016-08-08 MED ORDER — ONDANSETRON HCL 4 MG/2ML IJ SOLN
4.0000 mg | Freq: Once | INTRAMUSCULAR | Status: AC
  Administered 2016-08-08: 4 mg via INTRAMUSCULAR

## 2016-08-08 MED ORDER — ONDANSETRON HCL 4 MG/2ML IJ SOLN: 4.0000 mg | Freq: Once | INTRAMUSCULAR | Status: DC

## 2016-08-08 MED ORDER — SODIUM CHLORIDE 0.9 % IV BOLUS (SEPSIS): 1000.0000 mL | Freq: Once | INTRAVENOUS | Status: DC

## 2016-08-08 MED ORDER — ONDANSETRON 4 MG PO TBDP: 4.0000 mg | ORAL_TABLET | ORAL | 1 refills | Status: DC | PRN

## 2016-08-08 MED ORDER — HYDROMORPHONE HCL 1 MG/ML IJ SOLN
1.0000 mg | Freq: Once | INTRAMUSCULAR | Status: AC
  Administered 2016-08-08: 1 mg via INTRAMUSCULAR
  Filled 2016-08-08: qty 1

## 2016-08-08 NOTE — ED Triage Notes (Signed)
Pt reports generalized abd cramping with n/v/d since last night.

## 2016-08-08 NOTE — ED Notes (Signed)
Pt made aware to return if symptoms worsen or if any life threatening symptoms occur.   

## 2016-08-08 NOTE — ED Provider Notes (Signed)
[facility] DEPT Provider Note   CSN: [id number] Arrival date & time: 08/08/16  1159     History   Chief Complaint Chief Complaint  Patient presents with  . Abdominal Cramping    HPI Colleen Lee is a 60 y.o. female.  Pt presents to the ED today with abdominal pain and n/v/d.  The pt has a hx of cirrhosis and chronic pain.  She does take hydrocodone for her chronic pain, but said she's been having diarrhea; not constipation.  The pt denies any fevers.      Past Medical History:  Diagnosis Date  . Asthma   . Chronic back pain   . Cirrhosis ([place])    Metavir score F4 on elastography 2015  . Fibroids   . Fibromyalgia   . GERD (gastroesophageal reflux disease)   . H. pylori infection 2014   treated with pylera, had to be treated again as it was not eradicated. Urea breath test then negative after subsequent treatment.   . [name]    HCV RNA positive 09/2012  . Hypertension   . PONV (postoperative nausea and vomiting)    pt thinks maybe once she had N&V  . Sciatica of left side     Patient Active Problem List   Diagnosis Date Noted  . Gastroenteritis 06/14/2016  . Nausea vomiting and diarrhea 06/10/2016  . Uterine enlargement 06/09/2016  . Intractable nausea and vomiting 06/08/2016  . Acute infective gastroenteritis 06/08/2016  . Diarrhea 06/08/2016  . Essential hypertension 06/08/2016  . GERD (gastroesophageal reflux disease) 06/08/2016  . Abdominal pain   . Endometrial polyp 04/15/2016  . PMB (postmenopausal bleeding) 02/27/2016  . Alcoholic cirrhosis of liver without ascites ([place])   . Asthma 01/21/2016  . Hepatic cirrhosis ([place]) 09/22/2015  . Arthritis of knee, degenerative 02/05/2015  . History of Helicobacter pylori infection 10/30/2014  . Chronic [name] with cirrhosis ([place]) 04/11/2014  . De Quervain's disease (radial styloid tenosynovitis) 12/25/2013  . Anorexia 11/21/2012  . FH: colon cancer 11/21/2012  . Early satiety 10/25/2012  . Bowel  habit changes 10/25/2012  . Abdominal pain, epigastric 10/25/2012  . Abdominal bloating 10/25/2012  . Constipation 10/25/2012  . Abnormal weight loss 10/25/2012  . Chronic viral [name] ([place]) 10/25/2012  . Radicular pain of left lower extremity 09/28/2012  . Back pain 09/28/2012  . Sciatica 08/10/2011  . S/P arthroscopy of left knee 08/10/2011  . Tibial plateau fracture 08/10/2011  . Pain in joint, lower leg 02/12/2011  . Stiffness of joint, not elsewhere classified, lower leg 02/12/2011  . 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 Meniscus, medial, derangement 12/29/2010  . CLOSED FRACTURE OF UPPER END OF TIBIA 08/12/2010    Past Surgical History:  Procedure Laterality Date  . COLONOSCOPY  01/18/2008   BHA:LPFXTK rectum.  Long redundant colon, a diminutive sigmoid polyp status post cold biopsy removed. Hyperplastic polyp. Repeat colonoscopy June 2014 due to family history of colon cancer  . COLONOSCOPY WITH ESOPHAGOGASTRODUODENOSCOPY (EGD) N/A 11/02/2012   WIO:[id number] gastric mucosa of doubtful, +H.pylori. Incomplete colonoscopy due to patient unable to tolerate exam, proximal colon seen. Patient refused ACBE.  Marland Kitchen COLONOSCOPY WITH PROPOFOL N/A 03/08/2013   Dr. Gala Romney: colonic polyp-removed as scribed above. Internal Hemorrhoids. Pathology did not reveal any colonic tissue, only mucus. SURVEILLANCE DUE Aug 2019  . ESOPHAGOGASTRODUODENOSCOPY  01/18/2008   RMR: Normal esophagus, normal  stomach  . ESOPHAGOGASTRODUODENOSCOPY (EGD) WITH PROPOFOL N/A 02/09/2016   Procedure: ESOPHAGOGASTRODUODENOSCOPY (EGD) WITH PROPOFOL;  Surgeon: Daneil Dolin, MD;  Location: [facility];  Service: Endoscopy;  Laterality: N/A;  730   . HYSTEROSCOPY  05/11/2016   Procedure: HYSTEROSCOPY;  Surgeon: Jonnie Kind, MD;  Location: [facility];  Service: Gynecology;;  . KNEE SURGERY     left knee  . MULTIPLE EXTRACTIONS WITH ALVEOLOPLASTY N/A 10/15/2013   Procedure: MULTIPLE EXTRACTION WITH ALVEOLOPLASTY;   Surgeon: Gae Bon, DDS;  Location: [facility];  Service: Oral Surgery;  Laterality: N/A;  . POLYPECTOMY N/A 03/08/2013   Procedure: POLYPECTOMY;  Surgeon: Daneil Dolin, MD;  Location: [facility];  Service: Endoscopy;  Laterality: N/A;  . POLYPECTOMY N/A 05/11/2016   Procedure: REMOVAL OF ENDOMETRIAL POLYP;  Surgeon: Jonnie Kind, MD;  Location: [facility];  Service: Gynecology;  Laterality: N/A;  . TOTAL KNEE ARTHROPLASTY Left 02/05/2015   Procedure: LEFT TOTAL KNEE ARTHROPLASTY;  Surgeon: Carole Civil, MD;  Location: [facility];  Service: Orthopedics;  Laterality: Left;    OB History    Gravida Para Term Preterm AB Living             1   SAB TAB Ectopic Multiple Live Births                   Home Medications    Prior to Admission medications   Medication Sig Start Date End Date Taking? Authorizing Provider  aspirin 325 MG EC tablet Take 1 tablet (325 mg total) by mouth daily. 06/13/16  Yes Sinda Du, MD  budesonide-formoterol [facility]) 160-4.5 MCG/ACT inhaler Inhale 2 puffs into the lungs 2 (two) times daily.   Yes Historical Provider, MD  Cyanocobalamin (VITAMIN B 12 PO) Take 1 tablet by mouth daily.   Yes Historical Provider, MD  cyclobenzaprine (FLEXERIL) 10 MG tablet Take 1 tablet (10 mg total) by mouth 3 (three) times daily as needed for muscle spasms. 06/13/16  Yes Sinda Du, MD  esomeprazole (NEXIUM) 40 MG capsule Take 1 capsule (40 mg total) by mouth every morning. 12/19/14  Yes Annitta Needs, NP  HYDROcodone-acetaminophen (NORCO) 7.5-325 MG tablet Take 1 tablet by mouth every 8 (eight) hours as needed for moderate pain. 08/03/16  Yes Carole Civil, MD  lisinopril (PRINIVIL,ZESTRIL) 20 MG tablet Take 1 tablet by mouth daily. 09/25/15  Yes Historical Provider, MD  megestrol (MEGACE) 40 MG tablet Take 40 mg by mouth daily.   Yes Historical Provider, MD  promethazine (PHENERGAN) 25 MG tablet Take 1 tablet (25 mg total) by mouth every 6 (six) hours as needed for nausea or  vomiting. 06/10/16  Yes Kristen N Ward, DO  dicyclomine (BENTYL) 20 MG tablet Take 1 tablet (20 mg total) by mouth 2 (two) times daily. 08/08/16   Isla Pence, MD  ondansetron (ZOFRAN ODT) 4 MG disintegrating tablet Take 1 tablet (4 mg total) by mouth every 4 (four) hours as needed for nausea or vomiting. '4mg'$  ODT q4 hours prn nausea/vomit 08/08/16   Isla Pence, MD  Vitamin D, Ergocalciferol, (DRISDOL) 50000 units CAPS capsule Take 50,000 Units by mouth every 30 (thirty) days.    Historical Provider, MD    Family History Family History  Problem Relation Age of Onset  . Colon cancer Brother 56       . Multiple myeloma Brother   . Liver cancer Sister   . Prostate cancer Brother   . Pancreatic cancer Brother   . Cancer Mother     breast  . Asthma Mother     Social History Social History  Substance Use  Topics  . Smoking status: Current Every Day Smoker    Packs/day: 1.00    Years: 40.00    Types: Cigarettes  . Smokeless tobacco: Never Used     Comment: Smokes one pack of cigarettes daily  . Alcohol use No     Comment: former, last Jan 2014     Allergies   Penicillins   Review of Systems Review of Systems  Gastrointestinal: Positive for abdominal pain, diarrhea, nausea and vomiting.  All other systems reviewed and are negative.    Physical Exam Updated Vital Signs BP 127/83   Pulse 73   Temp 98.4 F (36.9 C) (Oral)   Resp 15   Ht '5\' 6"'$  (1.676 m)   Wt 178 lb (80.7 kg)   SpO2 99%   BMI 28.73 kg/m   Physical Exam  Constitutional: She is oriented to person, place, and time. She appears well-developed and well-nourished.  HENT:  Head: Normocephalic and atraumatic.  Right Ear: External ear normal.  Left Ear: External ear normal.  Nose: Nose normal.  Mouth/Throat: Oropharynx is clear and moist.  Eyes: Conjunctivae and EOM are normal. Pupils are equal, round, and reactive to light.  Neck: Normal range of motion. Neck supple.  Cardiovascular: Normal rate,  regular rhythm, normal heart sounds and intact distal pulses.   Pulmonary/Chest: Effort normal and breath sounds normal.  Abdominal: Bowel sounds are normal. There is tenderness in the right lower quadrant and left lower quadrant.  Musculoskeletal: Normal range of motion.  Neurological: She is alert and oriented to person, place, and time.  Skin: Skin is warm.  Psychiatric: She has a normal mood and affect. Her behavior is normal. Judgment and thought content normal.  Nursing note and vitals reviewed.    ED Treatments / Results  Labs (all labs ordered are listed, but only abnormal results are displayed) Labs Reviewed  COMPREHENSIVE METABOLIC PANEL - Abnormal; Notable for the following:       Result Value   Sodium 134 (*)    CO2 20 (*)    Glucose, Bld 106 (*)    Calcium 10.8 (*)    Total Protein 9.7 (*)    Albumin 5.2 (*)    Total Bilirubin 1.3 (*)    All other components within normal limits  CBC WITH DIFFERENTIAL/PLATELET - Abnormal; Notable for the following:    RBC 5.58 (*)    Hemoglobin 17.9 (*)    HCT 51.8 (*)    Lymphs Abs 0.6 (*)    All other components within normal limits  URINALYSIS, ROUTINE W REFLEX MICROSCOPIC - Abnormal; Notable for the following:    Hgb urine dipstick SMALL (*)    Ketones, ur 20 (*)    Protein, ur >=300 (*)    Bacteria, UA RARE (*)    All other components within normal limits  LIPASE, BLOOD    EKG  EKG Interpretation None       Radiology Dg Abd Acute W/chest  Result Date: 08/08/2016 CLINICAL DATA:  Abdominal pain with nausea and vomiting for 1 day EXAM: DG ABDOMEN ACUTE W/ 1V CHEST COMPARISON:  06/08/2016 FINDINGS: Cardiac shadows within normal limits. Lungs are clear bilaterally. No acute bony abnormality is seen. Scattered large and small bowel gas is noted. No obstructive changes are seen. No free air is noted. Mild degenerative changes of lumbar spine are seen. Calcified uterine fibroid is noted in the right hemipelvis. IMPRESSION:  Negative abdominal radiographs.  No acute cardiopulmonary disease. Electronically Signed   By: Elta Guadeloupe  Lukens M.D.   On: 08/08/2016 14:04   Ct Renal Stone Study  Result Date: 08/08/2016 CLINICAL DATA:  Abdominal pain.  Nausea vomiting diarrhea EXAM: CT ABDOMEN AND PELVIS WITHOUT CONTRAST TECHNIQUE: Multidetector CT imaging of the abdomen and pelvis was performed following the standard protocol without IV contrast. COMPARISON:  CT abdomen pelvis 06/08/2016 FINDINGS: Lower chest: Lung bases clear. Hepatobiliary: Normal liver, gallbladder, bile ducts. Pancreas: Negative Spleen: Negative Adrenals/Urinary Tract: Negative Stomach/Bowel: Normal stomach. Normal small bowel. Negative for bowel obstruction. No colonic mass or edema. Normal appendix. Vascular/Lymphatic: Mild atherosclerotic calcification in the aorta. No aneurysm. Negative for lymphadenopathy Reproductive: Calcified and noncalcified uterine fibroids similar to the prior CT. No pelvic mass Other: No free fluid.  Small umbilical hernia containing fat. Musculoskeletal: Negative IMPRESSION: No acute abnormality and no change from the prior CT. Normal appendix Calcified and noncalcified uterine fibroids. Electronically Signed   By: Franchot Gallo M.D.   On: 08/08/2016 15:59    Procedures Procedures (including critical care time)  Medications Ordered in ED Medications  oxyCODONE-acetaminophen (PERCOCET/ROXICET) 5-325 MG per tablet 1 tablet (not administered)  morphine 4 MG/ML injection 4 mg (4 mg Intramuscular Given 08/08/16 1241)  ondansetron (ZOFRAN) injection 4 mg (4 mg Intramuscular Given 08/08/16 1241)  HYDROmorphone (DILAUDID) injection 1 mg (1 mg Intramuscular Given 08/08/16 1416)     Initial Impression / Assessment and Plan / ED Course  I have reviewed the triage vital signs and the nursing notes.  Pertinent labs & imaging results that were available during my care of the patient were reviewed by me and considered in my medical decision making  (see chart for details).  Clinical Course    Pt is a difficult stick, so she requested no IV attempts.  She was given morphine and dilaudid for pain.  She was given 4 mg of zofran for nausea.  She feels much better and is able to tolerate po fluids.  She knows to return if worse.  Final Clinical Impressions(s) / ED Diagnoses   Final diagnoses:  Pain  Generalized abdominal pain    New Prescriptions New Prescriptions   DICYCLOMINE (BENTYL) 20 MG TABLET    Take 1 tablet (20 mg total) by mouth 2 (two) times daily.     Isla Pence, MD 08/08/16 [phone]

## 2016-08-17 ENCOUNTER — Other Ambulatory Visit: Payer: Self-pay | Admitting: *Deleted

## 2016-08-17 ENCOUNTER — Telehealth: Payer: Self-pay | Admitting: Orthopedic Surgery

## 2016-08-17 ENCOUNTER — Encounter: Payer: Self-pay | Admitting: Orthopedic Surgery

## 2016-08-17 MED ORDER — HYDROCODONE-ACETAMINOPHEN 7.5-325 MG PO TABS: 1.0000 | ORAL_TABLET | Freq: Three times a day (TID) | ORAL | 0 refills | Status: DC | PRN

## 2016-08-17 NOTE — Telephone Encounter (Signed)
Routing to Dr Harrison for approval 

## 2016-08-17 NOTE — Telephone Encounter (Signed)
approved

## 2016-08-17 NOTE — Telephone Encounter (Signed)
Hydrocodone-Acetaminophen 7.5/325mg   Qty 42 Tablets  Take 1 tablet by mouth every 8 (hours) as needed for moderate pain.

## 2016-08-17 NOTE — Progress Notes (Signed)
[place] controlled substance reporting system reviewed  

## 2016-08-30 ENCOUNTER — Other Ambulatory Visit: Payer: Self-pay | Admitting: Orthopedic Surgery

## 2016-08-30 ENCOUNTER — Telehealth: Payer: Self-pay | Admitting: Orthopedic Surgery

## 2016-08-30 MED ORDER — HYDROCODONE-ACETAMINOPHEN 7.5-325 MG PO TABS: 1.0000 | ORAL_TABLET | Freq: Three times a day (TID) | ORAL | 0 refills | Status: DC | PRN

## 2016-08-30 NOTE — Progress Notes (Unsigned)
.  [facility]

## 2016-08-30 NOTE — Telephone Encounter (Signed)
Patient called for refill: °HYDROcodone-acetaminophen (NORCO) 7.5-325 MG tablet 42 tablet  ° ° °

## 2016-08-30 NOTE — Telephone Encounter (Signed)
ROUTING TO DR HARRISON FOR APPROVAL 

## 2016-09-13 ENCOUNTER — Telehealth: Payer: Self-pay | Admitting: Orthopedic Surgery

## 2016-09-13 NOTE — Telephone Encounter (Signed)
Hydrocodone-Acetaminophen  7.5/325mg    Qty 42 Tablets  Take 1 tablet by mouth every 8 (eight) hours as needed for moderate pain.

## 2016-09-14 DIAGNOSIS — F17210 Nicotine dependence, cigarettes, uncomplicated: Secondary | ICD-10-CM | POA: Diagnosis not present

## 2016-09-14 DIAGNOSIS — F17200 Nicotine dependence, unspecified, uncomplicated: Secondary | ICD-10-CM | POA: Diagnosis not present

## 2016-09-14 DIAGNOSIS — F17218 Nicotine dependence, cigarettes, with other nicotine-induced disorders: Secondary | ICD-10-CM | POA: Diagnosis not present

## 2016-09-14 DIAGNOSIS — J441 Chronic obstructive pulmonary disease with (acute) exacerbation: Secondary | ICD-10-CM | POA: Diagnosis not present

## 2016-09-14 MED ORDER — HYDROCODONE-ACETAMINOPHEN 7.5-325 MG PO TABS: 1.0000 | ORAL_TABLET | Freq: Three times a day (TID) | ORAL | 0 refills | Status: DC | PRN

## 2016-09-14 NOTE — Telephone Encounter (Signed)
Routing to Dr Luna Glasgow to refill

## 2016-09-26 ENCOUNTER — Encounter (HOSPITAL_COMMUNITY): Payer: Self-pay | Admitting: Emergency Medicine

## 2016-09-26 ENCOUNTER — Emergency Department (HOSPITAL_COMMUNITY)
Admission: EM | Admit: 2016-09-26 | Discharge: 2016-09-26 | Disposition: A | Discharge status: Home / Self Care | Payer: Medicare HMO | Attending: Emergency Medicine | Admitting: Emergency Medicine

## 2016-09-26 ENCOUNTER — Emergency Department (HOSPITAL_COMMUNITY): Payer: Medicare HMO

## 2016-09-26 DIAGNOSIS — R1010 Upper abdominal pain, unspecified: Secondary | ICD-10-CM | POA: Insufficient documentation

## 2016-09-26 DIAGNOSIS — J45909 Unspecified asthma, uncomplicated: Secondary | ICD-10-CM | POA: Insufficient documentation

## 2016-09-26 DIAGNOSIS — R197 Diarrhea, unspecified: Secondary | ICD-10-CM | POA: Diagnosis not present

## 2016-09-26 DIAGNOSIS — R1110 Vomiting, unspecified: Secondary | ICD-10-CM | POA: Diagnosis not present

## 2016-09-26 DIAGNOSIS — Z79899 Other long term (current) drug therapy: Secondary | ICD-10-CM | POA: Insufficient documentation

## 2016-09-26 DIAGNOSIS — I10 Essential (primary) hypertension: Secondary | ICD-10-CM | POA: Insufficient documentation

## 2016-09-26 DIAGNOSIS — F17210 Nicotine dependence, cigarettes, uncomplicated: Secondary | ICD-10-CM | POA: Insufficient documentation

## 2016-09-26 DIAGNOSIS — Z7982 Long term (current) use of aspirin: Secondary | ICD-10-CM | POA: Insufficient documentation

## 2016-09-26 DIAGNOSIS — R112 Nausea with vomiting, unspecified: Secondary | ICD-10-CM | POA: Insufficient documentation

## 2016-09-26 LAB — COMPREHENSIVE METABOLIC PANEL
ALT: 17 U/L (ref 14–54)
AST: 23 U/L (ref 15–41)
Albumin: 4.9 g/dL (ref 3.5–5.0)
Alkaline Phosphatase: 60 U/L (ref 38–126)
Anion gap: 12 (ref 5–15)
BUN: 8 mg/dL (ref 6–20)
CO2: 20 mmol/L — ABNORMAL LOW (ref 22–32)
Calcium: 10.5 mg/dL — ABNORMAL HIGH (ref 8.9–10.3)
Chloride: 103 mmol/L (ref 101–111)
Creatinine, Ser: 0.69 mg/dL (ref 0.44–1.00)
GFR calc Af Amer: >60 mL/min (ref >60)
GFR calc non Af Amer: >60 mL/min (ref >60)
Glucose, Bld: 140 mg/dL — ABNORMAL HIGH (ref 65–99)
Potassium: 3.5 mmol/L (ref 3.5–5.1)
Sodium: 135 mmol/L (ref 135–145)
Total Bilirubin: 1.5 mg/dL — ABNORMAL HIGH (ref 0.3–1.2)
Total Protein: 9.3 g/dL — ABNORMAL HIGH (ref 6.5–8.1)

## 2016-09-26 LAB — CBC WITH DIFFERENTIAL/PLATELET
Basophils Absolute: 0 10*3/uL (ref 0.0–0.1)
Basophils Relative: 0 %
Eosinophils Absolute: 0 10*3/uL (ref 0.0–0.7)
Eosinophils Relative: 0 %
HCT: 45.1 % (ref 36.0–46.0)
Hemoglobin: 15.7 g/dL — ABNORMAL HIGH (ref 12.0–15.0)
Lymphocytes Relative: 6 %
Lymphs Abs: 0.6 10*3/uL — ABNORMAL LOW (ref 0.7–4.0)
MCH: 31.8 pg (ref 26.0–34.0)
MCHC: 34.8 g/dL (ref 30.0–36.0)
MCV: 91.5 fL (ref 78.0–100.0)
Monocytes Absolute: 0.4 10*3/uL (ref 0.1–1.0)
Monocytes Relative: 4 %
Neutro Abs: 9.6 10*3/uL — ABNORMAL HIGH (ref 1.7–7.7)
Neutrophils Relative %: 90 %
Platelets: 239 10*3/uL (ref 150–400)
RBC: 4.93 MIL/uL (ref 3.87–5.11)
RDW: 14.1 % (ref 11.5–15.5)
WBC: 10.6 10*3/uL — ABNORMAL HIGH (ref 4.0–10.5)

## 2016-09-26 LAB — LIPASE, BLOOD: Lipase: 22 U/L (ref 11–51)

## 2016-09-26 MED ORDER — HYDROMORPHONE HCL 1 MG/ML IJ SOLN
1.0000 mg | Freq: Once | INTRAMUSCULAR | Status: AC
  Administered 2016-09-26: 1 mg via INTRAVENOUS
  Filled 2016-09-26: qty 1

## 2016-09-26 MED ORDER — ONDANSETRON HCL 4 MG/2ML IJ SOLN
4.0000 mg | Freq: Once | INTRAMUSCULAR | Status: AC
  Administered 2016-09-26: 4 mg via INTRAVENOUS

## 2016-09-26 MED ORDER — IOPAMIDOL (ISOVUE-300) INJECTION 61%
100.0000 mL | Freq: Once | INTRAVENOUS | Status: AC | PRN
  Administered 2016-09-26: 100 mL via INTRAVENOUS

## 2016-09-26 MED ORDER — ONDANSETRON HCL 4 MG/2ML IJ SOLN
4.0000 mg | Freq: Once | INTRAMUSCULAR | Status: AC | PRN
  Administered 2016-09-26: 4 mg via INTRAVENOUS
  Filled 2016-09-26 (×2): qty 2

## 2016-09-26 MED ORDER — HYDROCODONE-ACETAMINOPHEN 5-325 MG PO TABS
1.0000 | ORAL_TABLET | Freq: Once | ORAL | Status: AC
  Administered 2016-09-26: 1 via ORAL
  Filled 2016-09-26: qty 1

## 2016-09-26 MED ORDER — IOPAMIDOL (ISOVUE-300) INJECTION 61%
INTRAVENOUS | Status: AC
  Administered 2016-09-26: 30 mL via ORAL
  Filled 2016-09-26: qty 30

## 2016-09-26 MED ORDER — TRAMADOL HCL 50 MG PO TABS: 50.0000 mg | ORAL_TABLET | Freq: Four times a day (QID) | ORAL | 0 refills | Status: DC | PRN

## 2016-09-26 MED ORDER — ONDANSETRON 4 MG PO TBDP: ORAL_TABLET | ORAL | 0 refills | Status: DC

## 2016-09-26 MED ORDER — SODIUM CHLORIDE 0.9 % IV BOLUS (SEPSIS): 1000.0000 mL | Freq: Once | INTRAVENOUS | Status: DC

## 2016-09-26 NOTE — Discharge Instructions (Signed)
Drink plenty of fluids. Follow-up with their family doctor or Dr.Rourke the stomach Dr.

## 2016-09-26 NOTE — ED Triage Notes (Signed)
Patient c/o upper abd pain with nausea, vomiting, and diarrhea. Per patient started yesterday. Patient unsure of any fevers but reports chills and body aches. Low grade temp noted in triage. Denies any urinary symptoms.

## 2016-09-26 NOTE — ED Notes (Signed)
Missed IV attempts in right forearm and antecubital.  Missed IV attempts in left forearm and antecubital.

## 2016-09-26 NOTE — ED Notes (Signed)
Unable to infuse Saline bolus or Dilaudid, discontinuing IV in right antecubital.

## 2016-09-26 NOTE — ED Provider Notes (Signed)
[facility] DEPT Provider Note   CSN: [id number] Arrival date & time: 09/26/16  1016   By signing my name below, I, Hilbert Odor, attest that this documentation has been prepared under the direction and in the presence of Milton Ferguson, MD. Electronically Signed: Hilbert Odor, Scribe. 09/26/16. 11:07 AM. History   Chief Complaint Chief Complaint  Patient presents with  . Abdominal Pain    HPI Comments: Colleen Lee is a 61 y.o. female who presents to the Emergency Department complaining of upper abdominal pain that began yesterday. The patient states that she doesn't feel well. She reports associated persistent watery diarrhea and vomiting since yesterday. She denies blood in vomit and diarrhea. She reports no alleviating factors.  The history is provided by the patient. No language interpreter was used.  Abdominal Pain   This is a new problem. The current episode started yesterday. The problem occurs constantly. The problem has been gradually worsening. The pain is located in the RUQ and LUQ. The quality of the pain is sharp. Associated symptoms include diarrhea, nausea and vomiting. Pertinent negatives include frequency, hematuria and headaches. Nothing relieves the symptoms.    Past Medical History:  Diagnosis Date  . Asthma   . Chronic back pain   . Cirrhosis ([place])    Metavir score F4 on elastography 2015  . Fibroids   . Fibromyalgia   . GERD (gastroesophageal reflux disease)   . H. pylori infection 2014   treated with pylera, had to be treated again as it was not eradicated. Urea breath test then negative after subsequent treatment.   . [name]    HCV RNA positive 09/2012  . Hypertension   . PONV (postoperative nausea and vomiting)    pt thinks maybe once she had N&V  . Sciatica of left side     Patient Active Problem List   Diagnosis Date Noted  . Gastroenteritis 06/14/2016  . Nausea vomiting and diarrhea 06/10/2016  . Uterine enlargement 06/09/2016   . Intractable nausea and vomiting 06/08/2016  . Acute infective gastroenteritis 06/08/2016  . Diarrhea 06/08/2016  . Essential hypertension 06/08/2016  . GERD (gastroesophageal reflux disease) 06/08/2016  . Abdominal pain   . Endometrial polyp 04/15/2016  . PMB (postmenopausal bleeding) 02/27/2016  . Alcoholic cirrhosis of liver without ascites ([place])   . Asthma 01/21/2016  . Hepatic cirrhosis ([place]) 09/22/2015  . Arthritis of knee, degenerative 02/05/2015  . History of Helicobacter pylori infection 10/30/2014  . Chronic [name] with cirrhosis ([place]) 04/11/2014  . De Quervain's disease (radial styloid tenosynovitis) 12/25/2013  . Anorexia 11/21/2012  . FH: colon cancer 11/21/2012  . Early satiety 10/25/2012  . Bowel habit changes 10/25/2012  . Abdominal pain, epigastric 10/25/2012  . Abdominal bloating 10/25/2012  . Constipation 10/25/2012  . Abnormal weight loss 10/25/2012  . Chronic viral [name] ([place]) 10/25/2012  . Radicular pain of left lower extremity 09/28/2012  . Back pain 09/28/2012  . Sciatica 08/10/2011  . S/P arthroscopy of left knee 08/10/2011  . Tibial plateau fracture 08/10/2011  . Pain in joint, lower leg 02/12/2011  . Stiffness of joint, not elsewhere classified, lower leg 02/12/2011  . 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 Meniscus, medial, derangement 12/29/2010  . CLOSED FRACTURE OF UPPER END OF TIBIA 08/12/2010    Past Surgical History:  Procedure Laterality Date  . COLONOSCOPY  01/18/2008   [id number]:[id number] rectum.  Long redundant colon, a diminutive sigmoid polyp status post cold biopsy removed. Hyperplastic polyp. Repeat colonoscopy June 2014 due to  family history of colon cancer  . COLONOSCOPY WITH ESOPHAGOGASTRODUODENOSCOPY (EGD) N/A 11/02/2012   JKK:[id number] gastric mucosa of doubtful, +H.pylori. Incomplete colonoscopy due to patient unable to tolerate exam, proximal colon seen. Patient refused ACBE.  Marland Kitchen COLONOSCOPY WITH PROPOFOL N/A 03/08/2013   Dr.  Gala Romney: colonic polyp-removed as scribed above. Internal Hemorrhoids. Pathology did not reveal any colonic tissue, only mucus. SURVEILLANCE DUE Aug 2019  . ESOPHAGOGASTRODUODENOSCOPY  01/18/2008   RMR: Normal esophagus, normal  stomach  . ESOPHAGOGASTRODUODENOSCOPY (EGD) WITH PROPOFOL N/A 02/09/2016   Procedure: ESOPHAGOGASTRODUODENOSCOPY (EGD) WITH PROPOFOL;  Surgeon: Daneil Dolin, MD;  Location: [facility];  Service: Endoscopy;  Laterality: N/A;  730   . HYSTEROSCOPY  05/11/2016   Procedure: HYSTEROSCOPY;  Surgeon: Jonnie Kind, MD;  Location: [facility];  Service: Gynecology;;  . KNEE SURGERY     left knee  . MULTIPLE EXTRACTIONS WITH ALVEOLOPLASTY N/A 10/15/2013   Procedure: MULTIPLE EXTRACTION WITH ALVEOLOPLASTY;  Surgeon: Gae Bon, DDS;  Location: [facility];  Service: Oral Surgery;  Laterality: N/A;  . POLYPECTOMY N/A 03/08/2013   Procedure: POLYPECTOMY;  Surgeon: Daneil Dolin, MD;  Location: [facility];  Service: Endoscopy;  Laterality: N/A;  . POLYPECTOMY N/A 05/11/2016   Procedure: REMOVAL OF ENDOMETRIAL POLYP;  Surgeon: Jonnie Kind, MD;  Location: [facility];  Service: Gynecology;  Laterality: N/A;  . TOTAL KNEE ARTHROPLASTY Left 02/05/2015   Procedure: LEFT TOTAL KNEE ARTHROPLASTY;  Surgeon: Carole Civil, MD;  Location: [facility];  Service: Orthopedics;  Laterality: Left;    OB History    Gravida Para Term Preterm AB Living             1   SAB TAB Ectopic Multiple Live Births                   Home Medications    Prior to Admission medications   Medication Sig Start Date End Date Taking? Authorizing Provider  aspirin 325 MG EC tablet Take 1 tablet (325 mg total) by mouth daily. 06/13/16   Sinda Du, MD  budesonide-formoterol [facility]) 160-4.5 MCG/ACT inhaler Inhale 2 puffs into the lungs 2 (two) times daily.    Historical Provider, MD  Cyanocobalamin (VITAMIN B 12 PO) Take 1 tablet by mouth daily.    Historical Provider, MD  cyclobenzaprine (FLEXERIL) 10 MG  tablet Take 1 tablet (10 mg total) by mouth 3 (three) times daily as needed for muscle spasms. 06/13/16   Sinda Du, MD  dicyclomine (BENTYL) 20 MG tablet Take 1 tablet (20 mg total) by mouth 2 (two) times daily. 08/08/16   Isla Pence, MD  esomeprazole (NEXIUM) 40 MG capsule Take 1 capsule (40 mg total) by mouth every morning. 12/19/14   Annitta Needs, NP  HYDROcodone-acetaminophen (NORCO) 7.5-325 MG tablet Take 1 tablet by mouth every 8 (eight) hours as needed for moderate pain. 09/14/16   Sanjuana Kava, MD  lisinopril (PRINIVIL,ZESTRIL) 20 MG tablet Take 1 tablet by mouth daily. 09/25/15   Historical Provider, MD  megestrol (MEGACE) 40 MG tablet Take 40 mg by mouth daily.    Historical Provider, MD  ondansetron (ZOFRAN ODT) 4 MG disintegrating tablet Take 1 tablet (4 mg total) by mouth every 4 (four) hours as needed for nausea or vomiting. 62m ODT q4 hours prn nausea/vomit 08/08/16   JIsla Pence MD  promethazine (PHENERGAN) 25 MG tablet Take 1 tablet (25 mg total) by mouth every 6 (six) hours as needed for nausea or vomiting. 06/10/16  Kristen N Ward, DO  Vitamin D, Ergocalciferol, (DRISDOL) 50000 units CAPS capsule Take 50,000 Units by mouth every 30 (thirty) days.    Historical Provider, MD    Family History Family History  Problem Relation Age of Onset  . Colon cancer Brother 82       . Multiple myeloma Brother   . Liver cancer Sister   . Prostate cancer Brother   . Pancreatic cancer Brother   . Cancer Mother     breast  . Asthma Mother     Social History Social History  Substance Use Topics  . Smoking status: Current Every Day Smoker    Packs/day: 1.00    Years: 40.00    Types: Cigarettes  . Smokeless tobacco: Never Used     Comment: Smokes one pack of cigarettes daily  . Alcohol use No     Allergies   Penicillins   Review of Systems Review of Systems  Constitutional: Negative for appetite change and fatigue.  HENT: Negative for congestion, ear discharge and  sinus pressure.   Eyes: Negative for discharge.  Respiratory: Negative for cough.   Cardiovascular: Negative for chest pain.  Gastrointestinal: Positive for abdominal pain, diarrhea, nausea and vomiting.  Genitourinary: Negative for frequency and hematuria.  Musculoskeletal: Negative for back pain.  Skin: Negative for rash.  Neurological: Negative for seizures and headaches.  Psychiatric/Behavioral: Negative for hallucinations.  All other systems reviewed and are negative.    Physical Exam Updated Vital Signs BP 190/94 (BP Location: Left Arm)   Pulse 77   Temp 99.1 F (37.3 C) (Oral)   Resp 24   Ht _0  (1.676 m)   Wt 184 lb (83.5 kg)   SpO2 100%   BMI 29.70 kg/m   Physical Exam  Constitutional: She is oriented to person, place, and time. She appears well-developed.  HENT:  Head: Normocephalic.  Eyes: Conjunctivae and EOM are normal. No scleral icterus.  Neck: Neck supple. No thyromegaly present.  Cardiovascular: Normal rate and regular rhythm.  Exam reveals no gallop and no friction rub.   No murmur heard. Pulmonary/Chest: No stridor. She has wheezes. She has no rales. She exhibits no tenderness.  Minimal wheezing bilaterally.  Abdominal: She exhibits no distension. There is tenderness. There is no rebound.  Mild tenderness to ruq and luq.  Musculoskeletal: Normal range of motion. She exhibits no edema.  Lymphadenopathy:    She has no cervical adenopathy.  Neurological: She is oriented to person, place, and time. She exhibits normal muscle tone. Coordination normal.  Skin: No rash noted. No erythema.  Psychiatric: She has a normal mood and affect. Her behavior is normal.     ED Treatments / Results  DIAGNOSTIC STUDIES: Oxygen Saturation is 100% on RA, normal by my interpretation.    COORDINATION OF CARE: 10:49 AM Discussed treatment plan with pt at bedside and pt agreed to plan. I will check the patient's labs.  Labs (all labs ordered are listed, but only  abnormal results are displayed) Labs Reviewed  LIPASE, BLOOD  COMPREHENSIVE METABOLIC PANEL  URINALYSIS, ROUTINE W REFLEX MICROSCOPIC  CBC WITH DIFFERENTIAL/PLATELET    EKG  EKG Interpretation None       Radiology No results found.  Procedures Procedures (including critical care time)  Medications Ordered in ED Medications  ondansetron (ZOFRAN) injection 4 mg (not administered)  sodium chloride 0.9 % bolus 1,000 mL (1,000 mLs Intravenous New Bag/Given 09/26/16 1103)  ondansetron (ZOFRAN) injection 4 mg (4 mg Intravenous Given 09/26/16  1058)  HYDROmorphone (DILAUDID) injection 1 mg (1 mg Intravenous Given 09/26/16 1103)     Initial Impression / Assessment and Plan / ED Course  I have reviewed the triage vital signs and the nursing notes.  Pertinent labs & imaging results that were available during my care of the patient were reviewed by me and considered in my medical decision making (see chart for details).   patient with abdominal pain nausea vomiting diarrhea. CT scan unremarkable patient referred to GI    Final Clinical Impressions(s) / ED Diagnoses   Final diagnoses:  None    New Prescriptions New Prescriptions   No medications on file  The chart was scribed for me under my direct supervision.  I personally performed the history, physical, and medical decision making and all procedures in the evaluation of this patient.Milton Ferguson, MD 09/27/16 [phone]

## 2016-09-28 ENCOUNTER — Ambulatory Visit (INDEPENDENT_AMBULATORY_CARE_PROVIDER_SITE_OTHER): Payer: Medicare HMO | Admitting: Orthopedic Surgery

## 2016-09-28 ENCOUNTER — Encounter: Payer: Self-pay | Admitting: Orthopedic Surgery

## 2016-09-28 DIAGNOSIS — G894 Chronic pain syndrome: Secondary | ICD-10-CM

## 2016-09-28 DIAGNOSIS — M5410 Radiculopathy, site unspecified: Secondary | ICD-10-CM | POA: Diagnosis not present

## 2016-09-28 MED ORDER — HYDROCODONE-ACETAMINOPHEN 7.5-325 MG PO TABS: 1.0000 | ORAL_TABLET | Freq: Three times a day (TID) | ORAL | 0 refills | Status: DC | PRN

## 2016-09-28 NOTE — Progress Notes (Signed)
FU CHRONIC PAIN   The patient comes in today she is still complaining of lower back pain with left leg radiculopathy  She says that she can have since anything done right now because her husband is undergoing chemotherapy  Review of systems she complains of nausea she went to the emergency room they gave her something for that in Dr. Legrand Rams is to have her follow-up with a specialist  I refilled her pain medicine again I emphasized that she needs to see the spine specialist for further treatment   Encounter Diagnoses  Name Primary?  . Chronic pain syndrome-No improvement  Yes  . Radicular pain of left lower extremity-No improvement      She'll come back in 3 months for ongoing chronic pain management  [place] controlled substance reporting system reviewed Meds ordered this encounter  Medications  . HYDROcodone-acetaminophen (NORCO) 7.5-325 MG tablet    Sig: Take 1 tablet by mouth every 8 (eight) hours as needed for moderate pain.    Dispense:  42 tablet    Refill:  0

## 2016-09-29 ENCOUNTER — Encounter: Payer: Self-pay | Admitting: Internal Medicine

## 2016-09-29 ENCOUNTER — Ambulatory Visit (INDEPENDENT_AMBULATORY_CARE_PROVIDER_SITE_OTHER): Payer: Medicare HMO | Admitting: Nurse Practitioner

## 2016-09-29 ENCOUNTER — Encounter: Payer: Self-pay | Admitting: Nurse Practitioner

## 2016-09-29 VITALS — BP 117/90 | HR 93 | Temp 97.9°F | Ht 66.0 in | Wt 173.0 lb

## 2016-09-29 DIAGNOSIS — R112 Nausea with vomiting, unspecified: Secondary | ICD-10-CM | POA: Diagnosis not present

## 2016-09-29 DIAGNOSIS — K219 Gastro-esophageal reflux disease without esophagitis: Secondary | ICD-10-CM

## 2016-09-29 DIAGNOSIS — R1084 Generalized abdominal pain: Secondary | ICD-10-CM

## 2016-09-29 DIAGNOSIS — K5900 Constipation, unspecified: Secondary | ICD-10-CM | POA: Diagnosis not present

## 2016-09-29 NOTE — Assessment & Plan Note (Signed)
The patient has ongoing abdominal pain. Severe exacerbations occur about every other month, per her. She has multiple possible etiologies. She does have GERD, although this seems to be well-controlled. Recently had an exacerbation of her abdominal pain in association with nausea and vomiting as well as diarrhea. CT the abdomen is reassuring. I will provide for stool studies if she has anymore loose stools. Given history of atherosclerosis on CT, diarrhea, significant abdominal pain which is cyclical I will check a CT angiogram to rule out mesenteric ischemia. I will have her return for follow-up in 2 weeks. ER precautions given. If she continues with persistent symptoms despite extensive evaluation she may need referral to tertiary care center.

## 2016-09-29 NOTE — Progress Notes (Signed)
cc'ed to pcp °

## 2016-09-29 NOTE — Assessment & Plan Note (Signed)
Has had nausea and vomiting for the past several days, but none in the past day. Decreased appetite due to nausea and vomiting. In conjunction with abdominal pain and diarrhea there are multiple etiologies could be going on. CT is reassuring, labs reassuring except for mild leukocytosis. I'll recheck a CBC and CMP today given decreased oral intake. I'll also check an abdominal x-ray for constipation as the patient is not currently on her constipation medication. Continue antidiabetic as needed. Return for follow-up in 2 weeks. ER precautions given.

## 2016-09-29 NOTE — Assessment & Plan Note (Signed)
History of constipation, was previously on Linzess. This was stopped at some point in the last year. Before her diarrhea this weekend she states she was having constipation. Abdominal x-ray to check for excessive stool burden, consider restarting constipation medication pending results of abdominal x-ray. Return for follow-up in 2 weeks.

## 2016-09-29 NOTE — Assessment & Plan Note (Signed)
GERD symptoms currently well-controlled and unlikely contributing to her symptoms today. Continue to monitor.

## 2016-09-29 NOTE — Patient Instructions (Signed)
1. Have your x-ray done when you're able to. 2. Have your labs drawn when you're able to. 3. We will give the stool study cups. Collect a stool sample if you have any more diarrhea and bring it to the lab, not our office. 4. We will help schedule your special CT to check for blood flow to your intestines. 5. Return for follow-up in 2 weeks. 6. If you have severe worsening of your symptoms, and notify us or proceed to the emergency room.

## 2016-09-29 NOTE — Progress Notes (Signed)
Referring Provider: Rosita Fire, MD Primary Care Physician:  Rosita Fire, MD Primary GI:  Dr. Gala Romney  Chief Complaint  Patient presents with  . Abdominal Pain    went to ER 2/25  . Emesis    not now  . Dizziness    started yesterday    HPI:   Colleen Lee is a 61 y.o. female who presents For abdominal pain, emesis, dizziness. The patient was last seen in our office 06/14/2016 for cirrhosis and gastroenteritis. 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 CT of the abdomen 06/08/2016 without noted specific lesions. Some mild anemia with hemoglobin ranging from 11.6-12.2 over the last 2 checks. Return to baseline hemoglobin 06/08/2016. EGD on file completed 02/09/2016 essentially normal recommend continue medications and repeat screening endoscopy in 2 years. Next due for colonoscopy in 2019, last completed in 2014. Family history of colon cancer.  At the time of her last visit she noted that she had recently been admitted for nausea and vomiting likely gastroenteritis but was feeling much better at her last visit. Under significant stress due to husband's prostate cancer and surgical resection. No other GI symptoms. Recommended continue him medications, due for ultrasound and labs in January 2018, return for follow-up in May 2018 for routine liver care.   Some labs appear to been drawn including CBC, CMP, lipase. However AFP and PTT/INR were not drawn. His recent labs show white blood cell count 10.6, hemoglobin normal, LFTs normal, creatinine normal, lipase normal.  Recent emergency department visit on 09/26/2016 for nausea and vomiting with diarrhea as well as abdominal pain. Noted upper abdominal pain which began the day prior to presentation, overall does not feel well with persistent watery diarrhea and vomiting since the day prior. CT completed to 20 12/19/2016 no acute findings, no  bowel structure inflammation, normal appendix. She was subsequently referred to GI.  Today she states she's not feeling well. Having abdominal pain in the lower abdomen, is similar to the pain she gets every other month. Described as sharp. Also with N/V which resolved about 1 day ago with Zofran. When she does have vomiting it is recurrent/constant. Had two "bad days of diarrhea" 3 and 4 days ago, none since. Denies hematochezia, melena. Last bowel movement on Sunday. GERD symptoms well controlled currently, control is diet dependent. Was previously constipated. States she saw a doctor in [place] on tv and discussed symptoms that she's having and isn't sure what condition he said. Denies chest pain, dyspnea, dizziness, lightheadedness, syncope, near syncope. Denies any other upper or lower GI symptoms.  Is not taking anything for constipation.  Past Medical History:  Diagnosis Date  . Asthma   . Chronic back pain   . Cirrhosis ([place])    Metavir score F4 on elastography 2015  . Fibroids   . Fibromyalgia   . GERD (gastroesophageal reflux disease)   . H. pylori infection 2014   treated with pylera, had to be treated again as it was not eradicated. Urea breath test then negative after subsequent treatment.   . [name]    HCV RNA positive 09/2012  . Hypertension   . PONV (postoperative nausea and vomiting)    pt thinks maybe once she had N&V  . Sciatica of left side     Past Surgical History:  Procedure Laterality Date  . COLONOSCOPY  01/18/2008   UTM:LYYTKP rectum.  Long redundant colon, a diminutive sigmoid polyp status  post cold biopsy removed. Hyperplastic polyp. Repeat colonoscopy June 2014 due to family history of colon cancer  . COLONOSCOPY WITH ESOPHAGOGASTRODUODENOSCOPY (EGD) N/A 11/02/2012   PZW:CHENIDPO gastric mucosa of doubtful, +H.pylori. Incomplete colonoscopy due to patient unable to tolerate exam, proximal colon seen. Patient refused ACBE.  Marland Kitchen COLONOSCOPY WITH PROPOFOL  N/A 03/08/2013   Dr. Gala Romney: colonic polyp-removed as scribed above. Internal Hemorrhoids. Pathology did not reveal any colonic tissue, only mucus. SURVEILLANCE DUE Aug 2019  . ESOPHAGOGASTRODUODENOSCOPY  01/18/2008   RMR: Normal esophagus, normal  stomach  . ESOPHAGOGASTRODUODENOSCOPY (EGD) WITH PROPOFOL N/A 02/09/2016   Procedure: ESOPHAGOGASTRODUODENOSCOPY (EGD) WITH PROPOFOL;  Surgeon: Daneil Dolin, MD;  [facility];  Service: Endoscopy;  Laterality: N/A;  730   . HYSTEROSCOPY  05/11/2016   Procedure: HYSTEROSCOPY;  Surgeon: Jonnie Kind, MD;  Location: [facility];  Service: Gynecology;;  . KNEE SURGERY     left knee  . MULTIPLE EXTRACTIONS WITH ALVEOLOPLASTY N/A 10/15/2013   Procedure: MULTIPLE EXTRACTION WITH ALVEOLOPLASTY;  Surgeon: Gae Bon, DDS;  Location: [facility];  Service: Oral Surgery;  Laterality: N/A;  . POLYPECTOMY N/A 03/08/2013   Procedure: POLYPECTOMY;  Surgeon: Daneil Dolin, MD;  Location: [facility];  Service: Endoscopy;  Laterality: N/A;  . POLYPECTOMY N/A 05/11/2016   Procedure: REMOVAL OF ENDOMETRIAL POLYP;  Surgeon: Jonnie Kind, MD;  Location: [facility];  Service: Gynecology;  Laterality: N/A;  . TOTAL KNEE ARTHROPLASTY Left 02/05/2015   Procedure: LEFT TOTAL KNEE ARTHROPLASTY;  Surgeon: Carole Civil, MD;  Location: [facility];  Service: Orthopedics;  Laterality: Left;    Current Outpatient Prescriptions  Medication Sig Dispense Refill  . aspirin 325 MG EC tablet Take 1 tablet (325 mg total) by mouth daily. 30 tablet 12  . azithromycin (ZITHROMAX) 250 MG tablet Take 1 tablet by mouth daily.    . budesonide-formoterol (SYMBICORT) 160-4.5 MCG/ACT inhaler Inhale 2 puffs into the lungs 2 (two) times daily.    . Cyanocobalamin (VITAMIN B 12 PO) Take 1 tablet by mouth daily.    . cyclobenzaprine (FLEXERIL) 10 MG tablet Take 1 tablet (10 mg total) by mouth 3 (three) times daily as needed for muscle spasms. 30 tablet 0  . HYDROcodone-acetaminophen (NORCO) 7.5-325  MG tablet Take 1 tablet by mouth every 8 (eight) hours as needed for moderate pain. 42 tablet 0  . lisinopril (PRINIVIL,ZESTRIL) 20 MG tablet Take 1 tablet by mouth daily.    . megestrol (MEGACE) 40 MG tablet Take 40 mg by mouth daily.    . ondansetron (ZOFRAN ODT) 4 MG disintegrating tablet '4mg'$  ODT q4 hours prn nausea/vomit 12 tablet 0  . predniSONE (DELTASONE) 10 MG tablet Take 1 tablet by mouth daily.    . Vitamin D, Ergocalciferol, (DRISDOL) 50000 units CAPS capsule Take 50,000 Units by mouth every 30 (thirty) days.     No current facility-administered medications for this visit.     Allergies as of 09/29/2016 - Review Complete 09/29/2016  Allergen Reaction Noted  . Penicillins Rash     Family History  Problem Relation Age of Onset  . Colon cancer Brother 42       . Multiple myeloma Brother   . Liver cancer Sister   . Prostate cancer Brother   . Pancreatic cancer Brother   . Cancer Mother     breast  . Asthma Mother     Social History   Social History  . Marital status: Married    Spouse name: N/A  .  Number of children: 0  . Years of education: N/A   Occupational History  . umemployed     Social History Main Topics  . Smoking status: Current Every Day Smoker    Packs/day: 0.50    Years: 40.00    Types: Cigarettes  . Smokeless tobacco: Never Used     Comment: Smokes one pack of cigarettes daily  . Alcohol use No  . Drug use: No     Comment: history of marijuana in past  . Sexual activity: No   Other Topics Concern  . None   Social History Narrative  . None    Review of Systems: General: Negative for anorexia, weight loss, fever, chills, fatigue, weakness. Eyes: Negative for vision changes.  ENT: Negative for hoarseness, difficulty swallowing , nasal congestion. CV: Negative for chest pain, angina, palpitations, dyspnea on exertion, peripheral edema.  Respiratory: Negative for dyspnea at rest, dyspnea on exertion, cough, sputum, wheezing.  GI: See  history of present illness. GU:  Negative for dysuria, hematuria, urinary incontinence, urinary frequency, nocturnal urination.  MS: Negative for joint pain, low back pain.  Derm: Negative for rash or itching.  Neuro: Negative for weakness, abnormal sensation, seizure, frequent headaches, memory loss, confusion.  Psych: Negative for anxiety, depression, suicidal ideation, hallucinations.  Endo: Negative for unusual weight change.  Heme: Negative for bruising or bleeding. Allergy: Negative for rash or hives.   Physical Exam: BP 117/90   Pulse 93   Temp 97.9 F (36.6 C) (Oral)   Ht '5\' 6"'$  (1.676 m)   Wt 173 lb (78.5 kg)   BMI 27.92 kg/m  General:   Alert and oriented. Pleasant and cooperative. Well-nourished and well-developed.  Head:  Normocephalic and atraumatic. Eyes:  Without icterus, sclera clear and conjunctiva pink.  Ears:  Normal auditory acuity. Mouth:  No deformity or lesions, oral mucosa pink.  Throat/Neck:  Supple, without mass or thyromegaly. Cardiovascular:  S1, S2 present without murmurs appreciated. Normal pulses noted. Extremities without clubbing or edema. Respiratory:  Clear to auscultation bilaterally. No wheezes, rales, or rhonchi. No distress.  Gastrointestinal:  +BS, soft, non-tender and non-distended. No HSM noted. No guarding or rebound. No masses appreciated.  Rectal:  Deferred  Musculoskalatal:  Symmetrical without gross deformities. Normal posture. Skin:  Intact without significant lesions or rashes. Neurologic:  Alert and oriented x4;  grossly normal neurologically. Psych:  Alert and cooperative. Normal mood and affect. Heme/Lymph/Immune: No significant cervical adenopathy. No excessive bruising noted.    09/29/2016 11:21 AM   Disclaimer: This note was dictated with voice recognition software. Similar sounding words can inadvertently be transcribed and may not be corrected upon review.

## 2016-10-04 ENCOUNTER — Emergency Department (HOSPITAL_COMMUNITY): Payer: Medicare HMO

## 2016-10-04 ENCOUNTER — Emergency Department (HOSPITAL_COMMUNITY)
Admission: EM | Admit: 2016-10-04 | Discharge: 2016-10-04 | Disposition: A | Discharge status: Home / Self Care | Payer: Medicare HMO | Attending: Emergency Medicine | Admitting: Emergency Medicine

## 2016-10-04 ENCOUNTER — Encounter (HOSPITAL_COMMUNITY): Payer: Self-pay | Admitting: Emergency Medicine

## 2016-10-04 DIAGNOSIS — R5383 Other fatigue: Secondary | ICD-10-CM | POA: Diagnosis not present

## 2016-10-04 DIAGNOSIS — Z7982 Long term (current) use of aspirin: Secondary | ICD-10-CM | POA: Insufficient documentation

## 2016-10-04 DIAGNOSIS — R197 Diarrhea, unspecified: Secondary | ICD-10-CM | POA: Diagnosis not present

## 2016-10-04 DIAGNOSIS — F17210 Nicotine dependence, cigarettes, uncomplicated: Secondary | ICD-10-CM | POA: Diagnosis not present

## 2016-10-04 DIAGNOSIS — R1084 Generalized abdominal pain: Secondary | ICD-10-CM | POA: Insufficient documentation

## 2016-10-04 DIAGNOSIS — M549 Dorsalgia, unspecified: Secondary | ICD-10-CM | POA: Insufficient documentation

## 2016-10-04 DIAGNOSIS — Z79899 Other long term (current) drug therapy: Secondary | ICD-10-CM | POA: Insufficient documentation

## 2016-10-04 DIAGNOSIS — I10 Essential (primary) hypertension: Secondary | ICD-10-CM | POA: Diagnosis not present

## 2016-10-04 DIAGNOSIS — R112 Nausea with vomiting, unspecified: Secondary | ICD-10-CM | POA: Diagnosis not present

## 2016-10-04 DIAGNOSIS — R109 Unspecified abdominal pain: Secondary | ICD-10-CM | POA: Diagnosis not present

## 2016-10-04 DIAGNOSIS — J45909 Unspecified asthma, uncomplicated: Secondary | ICD-10-CM | POA: Insufficient documentation

## 2016-10-04 DIAGNOSIS — R140 Abdominal distension (gaseous): Secondary | ICD-10-CM | POA: Diagnosis not present

## 2016-10-04 LAB — COMPREHENSIVE METABOLIC PANEL
ALT: 15 U/L (ref 14–54)
AST: 22 U/L (ref 15–41)
Albumin: 4.6 g/dL (ref 3.5–5.0)
Alkaline Phosphatase: 55 U/L (ref 38–126)
Anion gap: 12 (ref 5–15)
BUN: 19 mg/dL (ref 6–20)
CO2: 19 mmol/L — ABNORMAL LOW (ref 22–32)
Calcium: 10.5 mg/dL — ABNORMAL HIGH (ref 8.9–10.3)
Chloride: 103 mmol/L (ref 101–111)
Creatinine, Ser: 0.85 mg/dL (ref 0.44–1.00)
GFR calc Af Amer: >60 mL/min (ref >60)
GFR calc non Af Amer: >60 mL/min (ref >60)
Glucose, Bld: 109 mg/dL — ABNORMAL HIGH (ref 65–99)
Potassium: 3.5 mmol/L (ref 3.5–5.1)
Sodium: 134 mmol/L — ABNORMAL LOW (ref 135–145)
Total Bilirubin: 1.3 mg/dL — ABNORMAL HIGH (ref 0.3–1.2)
Total Protein: 8.4 g/dL — ABNORMAL HIGH (ref 6.5–8.1)

## 2016-10-04 LAB — URINALYSIS, ROUTINE W REFLEX MICROSCOPIC
Bilirubin Urine: NEGATIVE
Glucose, UA: NEGATIVE mg/dL
Ketones, ur: 5 mg/dL — AB
Leukocytes, UA: NEGATIVE
Nitrite: NEGATIVE
Protein, ur: NEGATIVE mg/dL
Specific Gravity, Urine: 1.011 (ref 1.005–1.030)
pH: 6 (ref 5.0–8.0)

## 2016-10-04 LAB — CBC WITH DIFFERENTIAL/PLATELET
Basophils Absolute: 0 10*3/uL (ref 0.0–0.1)
Basophils Relative: 0 %
Eosinophils Absolute: 0 10*3/uL (ref 0.0–0.7)
Eosinophils Relative: 0 %
HCT: 45.9 % (ref 36.0–46.0)
Hemoglobin: 16.4 g/dL — ABNORMAL HIGH (ref 12.0–15.0)
Lymphocytes Relative: 15 %
Lymphs Abs: 1.2 10*3/uL (ref 0.7–4.0)
MCH: 32.1 pg (ref 26.0–34.0)
MCHC: 35.7 g/dL (ref 30.0–36.0)
MCV: 89.8 fL (ref 78.0–100.0)
Monocytes Absolute: 0.7 10*3/uL (ref 0.1–1.0)
Monocytes Relative: 9 %
Neutro Abs: 6.4 10*3/uL (ref 1.7–7.7)
Neutrophils Relative %: 76 %
Platelets: 154 10*3/uL (ref 150–400)
RBC: 5.11 MIL/uL (ref 3.87–5.11)
RDW: 13.5 % (ref 11.5–15.5)
Smear Review: ADEQUATE
WBC: 8.3 10*3/uL (ref 4.0–10.5)

## 2016-10-04 LAB — LIPASE, BLOOD: Lipase: 30 U/L (ref 11–51)

## 2016-10-04 MED ORDER — SODIUM CHLORIDE 0.9 % IV BOLUS (SEPSIS)
500.0000 mL | Freq: Once | INTRAVENOUS | Status: AC
  Administered 2016-10-04: 500 mL via INTRAVENOUS

## 2016-10-04 MED ORDER — PROMETHAZINE HCL 25 MG/ML IJ SOLN
12.5000 mg | Freq: Once | INTRAMUSCULAR | Status: AC
  Administered 2016-10-04: 12.5 mg via INTRAVENOUS
  Filled 2016-10-04: qty 1

## 2016-10-04 MED ORDER — MORPHINE SULFATE (PF) 4 MG/ML IV SOLN
4.0000 mg | Freq: Once | INTRAVENOUS | Status: AC
  Administered 2016-10-04: 4 mg via INTRAVENOUS
  Filled 2016-10-04: qty 1

## 2016-10-04 MED ORDER — DICYCLOMINE HCL 20 MG PO TABS: 20.0000 mg | ORAL_TABLET | Freq: Two times a day (BID) | ORAL | 0 refills | Status: DC | PRN

## 2016-10-04 MED ORDER — PROMETHAZINE HCL 25 MG PO TABS
25.0000 mg | ORAL_TABLET | Freq: Four times a day (QID) | ORAL | 0 refills | Status: DC | PRN
Start: 2016-10-04 — End: 2017-03-31

## 2016-10-04 NOTE — ED Provider Notes (Signed)
[facility] DEPT Provider Note   CSN: [id number] Arrival date & time: 10/04/16  1005  By signing my name below, I, Colleen Lee, attest that this documentation has been prepared under the direction and in the presence of Colleen Rice, MD. Electronically Signed: Sonum Lee, Education administrator. 10/04/16. 8:54 PM.  History   Chief Complaint Chief Complaint  Patient presents with  . Emesis    HPI   HPI Comments: Colleen Lee is a 61 y.o. female with past medical history of cirrhosis, Hep C, GERD who presents to the Emergency Department complaining of ongoing episodes of vomiting and diarrhea that started about 9 days ago. She notes having 20+ daily episodes of vomiting and 1-2 daily episodes of watery diarrhea. She has associated abdominal distension along with upper and lower abdominal pain. She was initially seen in the ED on 09/26/16 for these symptoms and was referred to a GI specialist at that time. She was seen by them on 09/29/16 and has another appointment with them later this week. She has been taking Zofran without significant relief. She has had an endoscopy and colonoscopy in the past. She denies blood in stool or vomit.    Past Medical History:  Diagnosis Date  . Asthma   . Chronic back pain   . Cirrhosis ([place])    Metavir score F4 on elastography 2015  . Fibroids   . Fibromyalgia   . GERD (gastroesophageal reflux disease)   . H. pylori infection 2014   treated with pylera, had to be treated again as it was not eradicated. Urea breath test then negative after subsequent treatment.   . [name]    HCV RNA positive 09/2012  . Hypertension   . PONV (postoperative nausea and vomiting)    pt thinks maybe once she had N&V  . Sciatica of left side     Patient Active Problem List   Diagnosis Date Noted  . Gastroenteritis 06/14/2016  . Nausea with vomiting 06/10/2016  . Uterine enlargement 06/09/2016  . Intractable nausea and vomiting 06/08/2016  . Acute infective  gastroenteritis 06/08/2016  . Diarrhea 06/08/2016  . Essential hypertension 06/08/2016  . GERD (gastroesophageal reflux disease) 06/08/2016  . Abdominal pain   . Endometrial polyp 04/15/2016  . PMB (postmenopausal bleeding) 02/27/2016  . Alcoholic cirrhosis of liver without ascites ([place])   . Asthma 01/21/2016  . Hepatic cirrhosis ([place]) 09/22/2015  . Arthritis of knee, degenerative 02/05/2015  . History of Helicobacter pylori infection 10/30/2014  . Chronic [name] with cirrhosis ([place]) 04/11/2014  . De Quervain's disease (radial styloid tenosynovitis) 12/25/2013  . Anorexia 11/21/2012  . FH: colon cancer 11/21/2012  . Early satiety 10/25/2012  . Bowel habit changes 10/25/2012  . Abdominal pain, epigastric 10/25/2012  . Abdominal bloating 10/25/2012  . Constipation 10/25/2012  . Abnormal weight loss 10/25/2012  . Chronic viral [name] ([place]) 10/25/2012  . Radicular pain of left lower extremity 09/28/2012  . Back pain 09/28/2012  . Sciatica 08/10/2011  . S/P arthroscopy of left knee 08/10/2011  . Tibial plateau fracture 08/10/2011  . Pain in joint, lower leg 02/12/2011  . Stiffness of joint, not elsewhere classified, lower leg 02/12/2011  . 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 Meniscus, medial, derangement 12/29/2010  . CLOSED FRACTURE OF UPPER END OF TIBIA 08/12/2010    Past Surgical History:  Procedure Laterality Date  . COLONOSCOPY  01/18/2008   GYI:RSWNIO rectum.  Long redundant colon, a diminutive sigmoid polyp status post cold biopsy removed. Hyperplastic polyp. Repeat colonoscopy  June 2014 due to family history of colon cancer  . COLONOSCOPY WITH ESOPHAGOGASTRODUODENOSCOPY (EGD) N/A 11/02/2012   ZDG:LOVFIEPP gastric mucosa of doubtful, +H.pylori. Incomplete colonoscopy due to patient unable to tolerate exam, proximal colon seen. Patient refused ACBE.  Marland Kitchen COLONOSCOPY WITH PROPOFOL N/A 03/08/2013   Dr. Gala Romney: colonic polyp-removed as scribed above. Internal  Hemorrhoids. Pathology did not reveal any colonic tissue, only mucus. SURVEILLANCE DUE Aug 2019  . ESOPHAGOGASTRODUODENOSCOPY  01/18/2008   RMR: Normal esophagus, normal  stomach  . ESOPHAGOGASTRODUODENOSCOPY (EGD) WITH PROPOFOL N/A 02/09/2016   Procedure: ESOPHAGOGASTRODUODENOSCOPY (EGD) WITH PROPOFOL;  Surgeon: Daneil Dolin, MD;  [facility];  Service: Endoscopy;  Laterality: N/A;  730   . HYSTEROSCOPY  05/11/2016   Procedure: HYSTEROSCOPY;  Surgeon: Jonnie Kind, MD;  Location: [facility];  Service: Gynecology;;  . KNEE SURGERY     left knee  . MULTIPLE EXTRACTIONS WITH ALVEOLOPLASTY N/A 10/15/2013   Procedure: MULTIPLE EXTRACTION WITH ALVEOLOPLASTY;  Surgeon: Gae Bon, DDS;  Location: [facility];  Service: Oral Surgery;  Laterality: N/A;  . POLYPECTOMY N/A 03/08/2013   Procedure: POLYPECTOMY;  Surgeon: Daneil Dolin, MD;  Location[facility];  Service: Endoscopy;  Laterality: N/A;  . POLYPECTOMY N/A 05/11/2016   Procedure: REMOVAL OF ENDOMETRIAL POLYP;  Surgeon: Jonnie Kind, MD;  Location[facility];  Service: Gynecology;  Laterality: N/A;  . TOTAL KNEE ARTHROPLASTY Left 02/05/2015   Procedure: LEFT TOTAL KNEE ARTHROPLASTY;  Surgeon: Carole Civil, MD;  Location: [facility];  Service: Orthopedics;  Laterality: Left;    OB History    Gravida Para Term Preterm AB Living             1   SAB TAB Ectopic Multiple Live Births                   Home Medications    Prior to Admission medications   Medication Sig Start Date End Date Taking? Authorizing Provider  aspirin 325 MG EC tablet Take 1 tablet (325 mg total) by mouth daily. 06/13/16  Yes Sinda Du, MD  azithromycin (ZITHROMAX) 250 MG tablet Take 1 tablet by mouth daily. 09/14/16  Yes Historical Provider, MD  budesonide-formoterol (SYMBICORT) 160-4.5 MCG/ACT inhaler Inhale 2 puffs into the lungs 2 (two) times daily.   Yes Historical Provider, MD  Cyanocobalamin (VITAMIN B 12 PO) Take 1 tablet by mouth daily.   Yes  Historical Provider, MD  cyclobenzaprine (FLEXERIL) 10 MG tablet Take 1 tablet (10 mg total) by mouth 3 (three) times daily as needed for muscle spasms. Patient taking differently: Take 10 mg by mouth daily.  06/13/16  Yes Sinda Du, MD  HYDROcodone-acetaminophen (NORCO) 7.5-325 MG tablet Take 1 tablet by mouth every 8 (eight) hours as needed for moderate pain. 09/28/16  Yes Carole Civil, MD  lisinopril (PRINIVIL,ZESTRIL) 20 MG tablet Take 1 tablet by mouth daily. 09/25/15  Yes Historical Provider, MD  megestrol (MEGACE) 40 MG tablet Take 40 mg by mouth daily.   Yes Historical Provider, MD  ondansetron (ZOFRAN ODT) 4 MG disintegrating tablet 17m ODT q4 hours prn nausea/vomit 09/26/16  Yes JMilton Ferguson MD  predniSONE (DELTASONE) 10 MG tablet Take 1 tablet by mouth 2 (two) times daily with a meal.  09/14/16  Yes Historical Provider, MD  Vitamin D, Ergocalciferol, (DRISDOL) 50000 units CAPS capsule Take 50,000 Units by mouth every 30 (thirty) days.   Yes Historical Provider, MD  dicyclomine (BENTYL) 20 MG tablet Take 1 tablet (  20 mg total) by mouth 2 (two) times daily as needed for spasms. 10/04/16   Colleen Rice, MD  promethazine (PHENERGAN) 25 MG tablet Take 1 tablet (25 mg total) by mouth every 6 (six) hours as needed for nausea or vomiting. 10/04/16   Colleen Rice, MD    Family History Family History  Problem Relation Age of Onset  . Colon cancer Brother 56       . Multiple myeloma Brother   . Liver cancer Sister   . Prostate cancer Brother   . Pancreatic cancer Brother   . Cancer Mother     breast  . Asthma Mother     Social History Social History  Substance Use Topics  . Smoking status: Current Every Day Smoker    Packs/day: 0.50    Years: 40.00    Types: Cigarettes  . Smokeless tobacco: Never Used     Comment: Smokes one pack of cigarettes daily  . Alcohol use No     Allergies   Penicillins   Review of Systems Review of Systems  Constitutional: Positive  for fatigue. Negative for chills and fever.  Respiratory: Negative for cough and shortness of breath.   Cardiovascular: Negative for chest pain, palpitations and leg swelling.  Gastrointestinal: Positive for abdominal distention, abdominal pain, diarrhea, nausea and vomiting. Negative for blood in stool and constipation.  Genitourinary: Negative for difficulty urinating, dysuria, flank pain and hematuria.  Musculoskeletal: Positive for back pain. Negative for neck pain and neck stiffness.  Skin: Negative for rash and wound.  Neurological: Positive for weakness (generalized). Negative for dizziness, light-headedness, numbness and headaches.  All other systems reviewed and are negative.    Physical Exam Updated Vital Signs BP 163/97   Pulse 92   Temp 98.7 F (37.1 C) (Oral)   Resp 16   Ht 5' 6" (1.676 m)   Wt 173 lb (78.5 kg)   SpO2 99%   BMI 27.92 kg/m   Physical Exam  Constitutional: She is oriented to person, place, and time. She appears well-developed and well-nourished.  HENT:  Head: Normocephalic and atraumatic.  Mouth/Throat: Oropharynx is clear and moist. No oropharyngeal exudate.  Eyes: EOM are normal. Pupils are equal, round, and reactive to light.  Neck: Normal range of motion. Neck supple.  Cardiovascular: Normal rate and regular rhythm.   Pulmonary/Chest: Effort normal and breath sounds normal.  Abdominal: Soft. Bowel sounds are normal. She exhibits distension. There is tenderness. There is no rebound and no guarding.  Mild diffuse abdominal tenderness to palpation. Appears most pronounced in the epigastric and left upper quadrants. No rebound or guarding.  Musculoskeletal: Normal range of motion. She exhibits no edema or tenderness.  No CVA tenderness. No lower extremity swelling or asymmetry.  Neurological: She is alert and oriented to person, place, and time.  Moving all extremities without deficit. Sensation fully intact.  Skin: Skin is warm and dry. No rash  noted. No erythema.  Psychiatric: She has a normal mood and affect. Her behavior is normal.  Nursing note and vitals reviewed.    ED Treatments / Results  DIAGNOSTIC STUDIES: Oxygen Saturation is 100% on RA, normal by my interpretation.    COORDINATION OF CARE: 11:15 AM Discussed treatment plan with pt at bedside and pt agreed to plan.   Labs (all labs ordered are listed, but only abnormal results are displayed) Labs Reviewed  CBC WITH DIFFERENTIAL/PLATELET - Abnormal; Notable for the following:       Result Value   Hemoglobin  16.4 (*)    All other components within normal limits  COMPREHENSIVE METABOLIC PANEL - Abnormal; Notable for the following:    Sodium 134 (*)    CO2 19 (*)    Glucose, Bld 109 (*)    Calcium 10.5 (*)    Total Protein 8.4 (*)    Total Bilirubin 1.3 (*)    All other components within normal limits  URINALYSIS, ROUTINE W REFLEX MICROSCOPIC - Abnormal; Notable for the following:    Hgb urine dipstick SMALL (*)    Ketones, ur 5 (*)    Bacteria, UA RARE (*)    All other components within normal limits  LIPASE, BLOOD    EKG  EKG Interpretation None       Radiology Dg Abd Acute W/chest  Result Date: 10/04/2016 CLINICAL DATA:  Abdominal pain and distention along with nausea, vomiting and diarrhea for 1 week. EXAM: DG ABDOMEN ACUTE W/ 1V CHEST COMPARISON:  CT scan 09/26/2016 FINDINGS: The upright chest x-ray is normal. No acute cardiopulmonary findings. Two views of the abdomen demonstrate an unremarkable bowel gas pattern. Moderate scattered stool in the colon and scattered air-filled small bowel loops but no distention. No free air. The soft tissue shadows are grossly maintained. Calcified uterine fibroid noted in the right pelvis. Stable vascular calcifications. IMPRESSION: No acute cardiopulmonary findings. No plain film findings for an acute abdominal process. Electronically Signed   By: Marijo Sanes M.D.   On: 10/04/2016 12:21     Procedures Procedures (including critical care time)  Medications Ordered in ED Medications  promethazine (PHENERGAN) injection 12.5 mg (12.5 mg Intravenous Given 10/04/16 1152)  morphine 4 MG/ML injection 4 mg (4 mg Intravenous Given 10/04/16 1152)  sodium chloride 0.9 % bolus 500 mL (0 mLs Intravenous Stopped 10/04/16 1310)  morphine 4 MG/ML injection 4 mg (4 mg Intravenous Given 10/04/16 1338)  sodium chloride 0.9 % bolus 500 mL (0 mLs Intravenous Stopped 10/04/16 1421)     Initial Impression / Assessment and Plan / ED Course  I have reviewed the triage vital signs and the nursing notes.  Pertinent labs & imaging results that were available during my care of the patient were reviewed by me and considered in my medical decision making (see chart for details).     [name] abdominal pain is improved. No vomiting in the emergency department. Able to tolerate liquids. Repeat abdominal exam is benign. Advised follow-up with her gastroenterologist and return precautions given.  Final Clinical Impressions(s) / ED Diagnoses   Final diagnoses:  Nausea vomiting and diarrhea  Generalized abdominal pain    New Prescriptions Discharge Medication List as of 10/04/2016  2:05 PM    START taking these medications   Details  dicyclomine (BENTYL) 20 MG tablet Take 1 tablet (20 mg total) by mouth 2 (two) times daily as needed for spasms., Starting Mon 10/04/2016, Print    promethazine (PHENERGAN) 25 MG tablet Take 1 tablet (25 mg total) by mouth every 6 (six) hours as needed for nausea or vomiting., Starting Mon 10/04/2016, Print       I personally performed the services described in this documentation, which was scribed in my presence. The recorded information has been reviewed and is accurate.      Colleen Rice, MD 10/05/16 [phone]

## 2016-10-04 NOTE — ED Triage Notes (Signed)
Pt reports n/v/d continuing since being seen last week.  Sent by pcp this morning.  Actively vomiting at triage.

## 2016-10-04 NOTE — ED Notes (Signed)
Pt called for ride home, waiting for ride to arrive for pt

## 2016-10-04 NOTE — ED Notes (Signed)
Patient transported to X-ray 

## 2016-10-07 ENCOUNTER — Ambulatory Visit (HOSPITAL_COMMUNITY)
Admission: RE | Admit: 2016-10-07 | Discharge: 2016-10-07 | Disposition: A | Discharge status: Home / Self Care | Payer: Medicare HMO | Source: Ambulatory Visit | Attending: Nurse Practitioner | Admitting: Nurse Practitioner

## 2016-10-07 ENCOUNTER — Encounter (HOSPITAL_COMMUNITY): Payer: Self-pay

## 2016-10-07 DIAGNOSIS — K5900 Constipation, unspecified: Secondary | ICD-10-CM | POA: Insufficient documentation

## 2016-10-07 DIAGNOSIS — R109 Unspecified abdominal pain: Secondary | ICD-10-CM | POA: Diagnosis not present

## 2016-10-07 DIAGNOSIS — R1084 Generalized abdominal pain: Secondary | ICD-10-CM | POA: Insufficient documentation

## 2016-10-07 DIAGNOSIS — K219 Gastro-esophageal reflux disease without esophagitis: Secondary | ICD-10-CM | POA: Diagnosis not present

## 2016-10-07 DIAGNOSIS — I774 Celiac artery compression syndrome: Secondary | ICD-10-CM | POA: Diagnosis not present

## 2016-10-07 DIAGNOSIS — R112 Nausea with vomiting, unspecified: Secondary | ICD-10-CM | POA: Diagnosis not present

## 2016-10-07 MED ORDER — IOPAMIDOL (ISOVUE-370) INJECTION 76%
100.0000 mL | Freq: Once | INTRAVENOUS | Status: AC | PRN
  Administered 2016-10-07: 100 mL via INTRAVENOUS

## 2016-10-12 ENCOUNTER — Telehealth: Payer: Self-pay | Admitting: Orthopedic Surgery

## 2016-10-12 ENCOUNTER — Other Ambulatory Visit: Payer: Self-pay | Admitting: Orthopedic Surgery

## 2016-10-12 DIAGNOSIS — G894 Chronic pain syndrome: Secondary | ICD-10-CM

## 2016-10-12 DIAGNOSIS — M5410 Radiculopathy, site unspecified: Secondary | ICD-10-CM

## 2016-10-12 MED ORDER — HYDROCODONE-ACETAMINOPHEN 7.5-325 MG PO TABS: 1.0000 | ORAL_TABLET | Freq: Three times a day (TID) | ORAL | 0 refills | Status: DC | PRN

## 2016-10-12 NOTE — Progress Notes (Signed)
[place] controlled substance reporting system reviewed  

## 2016-10-12 NOTE — Telephone Encounter (Signed)
Patient called for refill, medication:  HYDROcodone-acetaminophen (NORCO) 7.5-325 MG tablet 42 tablet

## 2016-10-12 NOTE — Telephone Encounter (Signed)
Please advise 

## 2016-10-12 NOTE — Telephone Encounter (Signed)
CAN T GET UNTIL 3/20 BECAUSE SHE HAS GOTTEN TRAMADOL

## 2016-10-12 NOTE — Telephone Encounter (Signed)
Called patient, notified.  Voice understanding.

## 2016-10-12 NOTE — Telephone Encounter (Signed)
ROUTING TO DR HARRISON FOR APPROVAL 

## 2016-10-19 ENCOUNTER — Telehealth: Payer: Self-pay | Admitting: Orthopedic Surgery

## 2016-10-19 ENCOUNTER — Other Ambulatory Visit: Payer: Self-pay | Admitting: *Deleted

## 2016-10-19 ENCOUNTER — Ambulatory Visit (INDEPENDENT_AMBULATORY_CARE_PROVIDER_SITE_OTHER): Payer: Medicare HMO | Admitting: Internal Medicine

## 2016-10-19 ENCOUNTER — Encounter: Payer: Self-pay | Admitting: Internal Medicine

## 2016-10-19 VITALS — BP 157/86 | HR 87 | Temp 98.3°F | Ht 66.0 in | Wt 179.0 lb

## 2016-10-19 DIAGNOSIS — K219 Gastro-esophageal reflux disease without esophagitis: Secondary | ICD-10-CM

## 2016-10-19 DIAGNOSIS — K5900 Constipation, unspecified: Secondary | ICD-10-CM | POA: Diagnosis not present

## 2016-10-19 DIAGNOSIS — I774 Celiac artery compression syndrome: Secondary | ICD-10-CM | POA: Diagnosis not present

## 2016-10-19 DIAGNOSIS — M5410 Radiculopathy, site unspecified: Secondary | ICD-10-CM

## 2016-10-19 DIAGNOSIS — G894 Chronic pain syndrome: Secondary | ICD-10-CM

## 2016-10-19 DIAGNOSIS — I771 Stricture of artery: Secondary | ICD-10-CM

## 2016-10-19 DIAGNOSIS — G43A0 Cyclical vomiting, not intractable: Secondary | ICD-10-CM

## 2016-10-19 MED ORDER — HYDROCODONE-ACETAMINOPHEN 7.5-325 MG PO TABS: 1.0000 | ORAL_TABLET | Freq: Three times a day (TID) | ORAL | 0 refills | Status: DC | PRN

## 2016-10-19 MED ORDER — PANTOPRAZOLE SODIUM 40 MG PO TBEC: 40.0000 mg | DELAYED_RELEASE_TABLET | Freq: Every day | ORAL | 11 refills | Status: DC

## 2016-10-19 NOTE — Telephone Encounter (Signed)
ROUTING TO DR HARRISON FOR REVIEW 

## 2016-10-19 NOTE — Patient Instructions (Addendum)
Begin Protonix 40 mg daily (disp 30 with 11 refills)  Trial of Linzess 72 daily - samples x 2 weeks  Constipation information provided  Telephone progress report in 2 weeks  OV 6 weeks

## 2016-10-19 NOTE — Progress Notes (Signed)
Primary Care Physician:  Rosita Fire, MD Primary Gastroenterologist:  Dr.   Pre-Procedure History & Physical: HPI:  Colleen Lee is a 61 y.o. female here for follow-up of intermittent episodes of nausea, vomiting and difficulties with constipation. At least couple of ED visits. Seen here. 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  No other significant visceral abnormalities. Some nonspecific abdominal pain- intermitant, not post-prandial with long periods without pain.  Patient has months of symptom free periods in between exacerbations.  She has not lost any weight. She is chronically constipated  - along the way she came off acid suppression therapy. No dysphagia. Due for service colonoscopy 2019. Had brief diarrhea; did not return stool sample.  Past Medical History:  Diagnosis Date  . Asthma   . Chronic back pain   . Cirrhosis ([place])    Metavir score F4 on elastography 2015  . Fibroids   . Fibromyalgia   . GERD (gastroesophageal reflux disease)   . H. pylori infection 2014   treated with pylera, had to be treated again as it was not eradicated. Urea breath test then negative after subsequent treatment.   . [name]    HCV RNA positive 09/2012  . Hypertension   . PONV (postoperative nausea and vomiting)    pt thinks maybe once she had N&V  . Sciatica of left side     Past Surgical History:  Procedure Laterality Date  . COLONOSCOPY  01/18/2008   YOK:HTXHFS rectum.  Long redundant colon, a diminutive sigmoid polyp status post cold biopsy removed. Hyperplastic polyp. Repeat colonoscopy June 2014 due to family history of colon cancer  . COLONOSCOPY WITH ESOPHAGOGASTRODUODENOSCOPY (EGD) N/A 11/02/2012   FSE:LTRVUYEB gastric mucosa of doubtful, +H.pylori. Incomplete colonoscopy due to patient unable to tolerate exam, proximal colon seen. Patient refused ACBE.  Marland Kitchen COLONOSCOPY WITH PROPOFOL N/A  03/08/2013   Dr. Gala Romney: colonic polyp-removed as scribed above. Internal Hemorrhoids. Pathology did not reveal any colonic tissue, only mucus. SURVEILLANCE DUE Aug 2019  . ESOPHAGOGASTRODUODENOSCOPY  01/18/2008   RMR: Normal esophagus, normal  stomach  . ESOPHAGOGASTRODUODENOSCOPY (EGD) WITH PROPOFOL N/A 02/09/2016   Procedure: ESOPHAGOGASTRODUODENOSCOPY (EGD) WITH PROPOFOL;  Surgeon: Daneil Dolin, MD;  [facility];  Service: Endoscopy;  Laterality: N/A;  730   . HYSTEROSCOPY  05/11/2016   Procedure: HYSTEROSCOPY;  Surgeon: Jonnie Kind, MD;  Location: [facility];  Service: Gynecology;;  . KNEE SURGERY     left knee  . MULTIPLE EXTRACTIONS WITH ALVEOLOPLASTY N/A 10/15/2013   Procedure: MULTIPLE EXTRACTION WITH ALVEOLOPLASTY;  Surgeon: Gae Bon, DDS;  Location: [place];  Service: Oral Surgery;  Laterality: N/A;  . POLYPECTOMY N/A 03/08/2013   Procedure: POLYPECTOMY;  Surgeon: Daneil Dolin, MD;  Location: [facility];  Service: Endoscopy;  Laterality: N/A;  . POLYPECTOMY N/A 05/11/2016   Procedure: REMOVAL OF ENDOMETRIAL POLYP;  Surgeon: Jonnie Kind, MD;  Location: [facility];  Service: Gynecology;  Laterality: N/A;  . TOTAL KNEE ARTHROPLASTY Left 02/05/2015   Procedure: LEFT TOTAL KNEE ARTHROPLASTY;  Surgeon: Carole Civil, MD;  Location: [facility];  Service: Orthopedics;  Laterality: Left;    Prior to Admission medications   Medication Sig Start Date End Date Taking? Authorizing Provider  aspirin 325 MG EC tablet Take 1 tablet (325 mg total) by mouth daily. 06/13/16  Yes Sinda Du, MD  budesonide-formoterol [facility]) 160-4.5 MCG/ACT inhaler Inhale 2 puffs into the lungs  2 (two) times daily.   Yes Historical Provider, MD  Cyanocobalamin (VITAMIN B 12 PO) Take 1 tablet by mouth daily.   Yes Historical Provider, MD  HYDROcodone-acetaminophen (NORCO) 7.5-325 MG tablet Take 1 tablet by mouth every 8 (eight) hours as needed for moderate pain. 10/19/16  Yes Carole Civil, MD    lisinopril (PRINIVIL,ZESTRIL) 20 MG tablet Take 1 tablet by mouth daily. 09/25/15  Yes Historical Provider, MD  megestrol (MEGACE) 40 MG tablet Take 40 mg by mouth daily.   Yes Historical Provider, MD  ondansetron (ZOFRAN ODT) 4 MG disintegrating tablet 107m ODT q4 hours prn nausea/vomit 09/26/16  Yes JMilton Ferguson MD  predniSONE (DELTASONE) 10 MG tablet Take 1 tablet by mouth 2 (two) times daily with a meal.  09/14/16  Yes Historical Provider, MD  promethazine (PHENERGAN) 25 MG tablet Take 1 tablet (25 mg total) by mouth every 6 (six) hours as needed for nausea or vomiting. 10/04/16  Yes DJulianne Rice MD  Vitamin D, Ergocalciferol, (DRISDOL) 50000 units CAPS capsule Take 50,000 Units by mouth every 30 (thirty) days.   Yes Historical Provider, MD  azithromycin (ZITHROMAX) 250 MG tablet Take 1 tablet by mouth daily. 09/14/16   Historical Provider, MD  cyclobenzaprine (FLEXERIL) 10 MG tablet Take 1 tablet (10 mg total) by mouth 3 (three) times daily as needed for muscle spasms. Patient not taking: Reported on 10/19/2016 06/13/16   ESinda Du MD  dicyclomine (BENTYL) 20 MG tablet Take 1 tablet (20 mg total) by mouth 2 (two) times daily as needed for spasms. Patient not taking: Reported on 10/19/2016 10/04/16   DJulianne Rice MD    Allergies as of 10/19/2016 - Review Complete 10/19/2016  Allergen Reaction Noted  . Penicillins Rash     Family History  Problem Relation Age of Onset  . Colon cancer Brother 574      . Multiple myeloma Brother   . Liver cancer Sister   . Prostate cancer Brother   . Pancreatic cancer Brother   . Cancer Mother     breast  . Asthma Mother     Social History   Social History  . Marital status: Married    Spouse name: N/A  . Number of children: 0  . Years of education: N/A   Occupational History  . umemployed     Social History Main Topics  . Smoking status: Current Every Day Smoker    Packs/day: 0.50    Years: 40.00    Types: Cigarettes  .  Smokeless tobacco: Never Used     Comment: Smokes one pack of cigarettes daily  . Alcohol use No  . Drug use: No     Comment: history of marijuana in past  . Sexual activity: No   Other Topics Concern  . Not on file   Social History Narrative  . No narrative on file    Review of Systems: See HPI, otherwise negative ROS  Physical Exam: BP (!) 157/86   Pulse 87   Temp 98.3 F (36.8 C) (Oral)   Ht 5' 6" (1.676 m)   Wt 179 lb (81.2 kg)   BMI 28.89 kg/m  General:    pleasant and cooperative in NAD Neck:  Supple; no masses or thyromegaly. No significant cervical adenopathy. Lungs:  Clear throughout to auscultation.   No wheezes, crackles, or rhonchi. No acute distress. Heart:  Regular rate and rhythm; no murmurs, clicks, rubs,  or gallops. Abdomen: Non-distended, normal bowel sounds.  Soft and nontender  without appreciable mass or hepatosplenomegaly.  Pulses:  Normal pulses noted. Extremities:  Without clubbing or edema.  Impression:   Pleasant 61 year old lady with a HCV/possible EtOH related cirrhosis status post HCV eradication with episodic periods of bloating, nausea and vomiting. Chronic constipation. Occasional abdominal pain.  On a suboptimal bowel regimen at this time. I doubt mesenteric ischemia in spite of CT finding with open SMA, no weight loss, no hx of abdominal bruit and long periods without symptoms. Poorly controlled GERD and constipation likely to have more to do with her episodic symptoms. However,  If better management of her GERD and constipation don't improve symptoms or characteristics of her symptoms change, then further evaluation of her mesenteric vasculature would be warranted.   Recommendations:  Begin Linzess 72  - 1 capsule daily.Begin Protonix 40 mg daily. Telephone follow-up in 2 weeks. Office visit in 6 weeks.   Continue with cirrhosis care at next appointment.      Notice: This dictation was prepared with Dragon dictation along with  smaller phrase technology. Any transcriptional errors that result from this process are unintentional and may not be corrected upon review.

## 2016-10-19 NOTE — Telephone Encounter (Signed)
REFILL AND SET UP PAIN MANAGEMENT

## 2016-10-19 NOTE — Telephone Encounter (Signed)
Patient requests a refill on Hydrocodone/Acetaminophen 7.5-325  mgs.   Qty  42       Sig: Take 1 tablet by mouth every 8 (eight) hours as needed for moderate pain.

## 2016-11-02 ENCOUNTER — Telehealth: Payer: Self-pay | Admitting: Orthopedic Surgery

## 2016-11-02 ENCOUNTER — Other Ambulatory Visit: Payer: Self-pay | Admitting: Orthopedic Surgery

## 2016-11-02 DIAGNOSIS — M5410 Radiculopathy, site unspecified: Secondary | ICD-10-CM

## 2016-11-02 DIAGNOSIS — G894 Chronic pain syndrome: Secondary | ICD-10-CM

## 2016-11-02 MED ORDER — HYDROCODONE-ACETAMINOPHEN 7.5-325 MG PO TABS: 1.0000 | ORAL_TABLET | Freq: Three times a day (TID) | ORAL | 0 refills | Status: DC | PRN

## 2016-11-02 NOTE — Telephone Encounter (Signed)
Routing to Dr Harrison 

## 2016-11-02 NOTE — Telephone Encounter (Signed)
Patient requests refill on Hydrocodone/Acetaminophen 7.5-325  mgs.   Qty  42  Sig: Take 1 tablet by mouth every 8 (eight) hours as needed for moderate pain.

## 2016-11-02 NOTE — Progress Notes (Signed)
[place] controlled substance reporting system reviewed  

## 2016-11-16 ENCOUNTER — Telehealth: Payer: Self-pay | Admitting: Orthopedic Surgery

## 2016-11-16 NOTE — Telephone Encounter (Signed)
Patient called for refill: HYDROcodone-acetaminophen (NORCO) 7.5-325 MG tablet 42 tablet

## 2016-11-16 NOTE — Telephone Encounter (Signed)
ROUTING TO DR HARRISON FOR APPROVAL 

## 2016-11-16 NOTE — Telephone Encounter (Signed)
Yes  Time for pain clinic referral

## 2016-11-17 ENCOUNTER — Other Ambulatory Visit: Payer: Self-pay | Admitting: *Deleted

## 2016-11-17 DIAGNOSIS — G894 Chronic pain syndrome: Secondary | ICD-10-CM

## 2016-11-17 DIAGNOSIS — M5410 Radiculopathy, site unspecified: Secondary | ICD-10-CM

## 2016-11-17 MED ORDER — HYDROCODONE-ACETAMINOPHEN 7.5-325 MG PO TABS: 1.0000 | ORAL_TABLET | Freq: Three times a day (TID) | ORAL | 0 refills | Status: DC | PRN

## 2016-11-17 NOTE — Telephone Encounter (Signed)
I referred last month, it takes a while to get an appt.

## 2016-12-02 ENCOUNTER — Telehealth: Payer: Self-pay | Admitting: Orthopedic Surgery

## 2016-12-02 NOTE — Telephone Encounter (Signed)
Patient requests refill on Hydrocodone/Acetaminophen 7.5-25  mgs.   Qty  42  Sig: Take 1 tablet by mouth every 8 (eight) hours as needed for moderate pain.

## 2016-12-06 ENCOUNTER — Other Ambulatory Visit: Payer: Self-pay | Admitting: Orthopedic Surgery

## 2016-12-06 MED ORDER — HYDROCODONE-ACETAMINOPHEN 5-325 MG PO TABS: 1.0000 | ORAL_TABLET | Freq: Four times a day (QID) | ORAL | 0 refills | Status: DC | PRN

## 2016-12-06 NOTE — Telephone Encounter (Signed)
Routing to Dr Harrison for approval 

## 2016-12-13 ENCOUNTER — Ambulatory Visit (INDEPENDENT_AMBULATORY_CARE_PROVIDER_SITE_OTHER): Payer: Medicare HMO | Admitting: Nurse Practitioner

## 2016-12-13 ENCOUNTER — Encounter: Payer: Self-pay | Admitting: Nurse Practitioner

## 2016-12-13 VITALS — BP 150/91 | HR 91 | Temp 97.6°F | Ht 66.0 in | Wt 181.8 lb

## 2016-12-13 DIAGNOSIS — K219 Gastro-esophageal reflux disease without esophagitis: Secondary | ICD-10-CM

## 2016-12-13 DIAGNOSIS — R1084 Generalized abdominal pain: Secondary | ICD-10-CM

## 2016-12-13 DIAGNOSIS — K7460 Unspecified cirrhosis of liver: Secondary | ICD-10-CM

## 2016-12-13 DIAGNOSIS — K5900 Constipation, unspecified: Secondary | ICD-10-CM

## 2016-12-13 MED ORDER — LINACLOTIDE 72 MCG PO CAPS: 72.0000 ug | ORAL_CAPSULE | Freq: Every day | ORAL | 3 refills | Status: DC

## 2016-12-13 NOTE — Assessment & Plan Note (Signed)
Constipation significantly improved on Linzess 72 g. Having 2-3 bowel movements a day on Linzess. Significant improvement in abdominal pain as noted below. I will send in a refill of Linzess to her pharmacy and provide 1 boxes samples to last until she can get to the pharmacy. Return for follow-up in 3 months. Call if any worsening symptoms.

## 2016-12-13 NOTE — Patient Instructions (Addendum)
1. Continue taking your current medications. 2. I am giving you a box of samples for Linzess 72 g to take once a day, on an empty stomach. I have also sent in a prescription to your pharmacy. 3. Further information about foods to avoid with heartburn is below. 4. Have your labs drawn when you're able to. 5. Return for follow-up in 3 months. 6. Call if any worsening or severe symptoms.     Food Choices for Gastroesophageal Reflux Disease, Adult When you have gastroesophageal reflux disease (GERD), the foods you eat and your eating habits are very important. Choosing the right foods can help ease the discomfort of GERD. Consider working with a diet and nutrition specialist (dietitian) to help you make healthy food choices. What general guidelines should I follow? Eating plan   Choose healthy foods low in fat, such as fruits, vegetables, whole grains, low-fat dairy products, and lean meat, fish, and poultry.  Eat frequent, small meals instead of three large meals each day. Eat your meals slowly, in a relaxed setting. Avoid bending over or lying down until 2-3 hours after eating.  Limit high-fat foods such as fatty meats or fried foods.  Limit your intake of oils, butter, and shortening to less than 8 teaspoons each day.  Avoid the following:  Foods that cause symptoms. These may be different for different people. Keep a food diary to keep track of foods that cause symptoms.  Alcohol.  Drinking large amounts of liquid with meals.  Eating meals during the 2-3 hours before bed.  Cook foods using methods other than frying. This may include baking, grilling, or broiling. Lifestyle    Maintain a healthy weight. Ask your health care provider what weight is healthy for you. If you need to lose weight, work with your health care provider to do so safely.  Exercise for at least 30 minutes on 5 or more days each week, or as told by your health care provider.  Avoid wearing clothes that  fit tightly around your waist and chest.  Do not use any products that contain nicotine or tobacco, such as cigarettes and e-cigarettes. If you need help quitting, ask your health care provider.  Sleep with the head of your bed raised. Use a wedge under the mattress or blocks under the bed frame to raise the head of the bed. What foods are not recommended? The items listed may not be a complete list. Talk with your dietitian about what dietary choices are best for you. Grains  Pastries or quick breads with added fat. [place] toast. Vegetables  Deep fried vegetables. [place] fries. Any vegetables prepared with added fat. Any vegetables that cause symptoms. For some people this may include tomatoes and tomato products, chili peppers, onions and garlic, and horseradish. Fruits  Any fruits prepared with added fat. Any fruits that cause symptoms. For some people this may include citrus fruits, such as oranges, grapefruit, pineapple, and lemons. Meats and other protein foods  High-fat meats, such as fatty beef or pork, hot dogs, ribs, ham, sausage, salami and bacon. Fried meat or protein, including fried fish and fried chicken. Nuts and nut butters. Dairy  Whole milk and chocolate milk. Sour cream. Cream. Ice cream. Cream cheese. Milk shakes. Beverages  Coffee and tea, with or without caffeine. Carbonated beverages. Sodas. Energy drinks. Fruit juice made with acidic fruits (such as orange or grapefruit). Tomato juice. Alcoholic drinks. Fats and oils  Butter. Margarine. Shortening. Ghee. Sweets and desserts  Chocolate and cocoa.  Donuts. Seasoning and other foods  Pepper. Peppermint and spearmint. Any condiments, herbs, or seasonings that cause symptoms. For some people, this may include curry, hot sauce, or vinegar-based salad dressings. Summary  When you have gastroesophageal reflux disease (GERD), food and lifestyle choices are very important to help ease the discomfort of GERD.  Eat frequent,  small meals instead of three large meals each day. Eat your meals slowly, in a relaxed setting. Avoid bending over or lying down until 2-3 hours after eating.  Limit high-fat foods such as fatty meat or fried foods. This information is not intended to replace advice given to you by your health care provider. Make sure you discuss any questions you have with your health care provider. Document Released: 07/19/2005 Document Revised: 07/20/2016 Document Reviewed: 07/20/2016 Elsevier Interactive Patient Education  2017 [employer].

## 2016-12-13 NOTE — Progress Notes (Signed)
Referring Provider: Rosita Fire, MD Primary Care Physician:  Rosita Fire, MD Primary GI:  Dr. Gala Romney  Chief Complaint  Patient presents with  . Cirrhosis    HPI:   Colleen Lee is a 61 y.o. female who presents for follow-up on cirrhosis and gastroparesis. The patient was last seen in our office 31/49/7026 for cyclical vomiting, GERD, constipation, celiac artery stenosis. CTA on March 8 demonstrated medium arcuate ligament syndrome, patent SMA, IMA occluded with collateral reconstitution.. Nonspecific abdominal pain which is intermittent and with long periods without pain. No weight loss. Chronically constipated. Had a brief bout of diarrhea but did not return the stool sample. Due for colonoscopy 03/2018.  HCV/possible EtOH related cirrhosis status post viral eradication with nonspecific symptoms, chronic constipation, occasional abdominal pain. Bowel regimen suboptimal and this time. Doubt mesenteric ischemia given no weight loss. Poorly controlled GERD and constipation likely more contributory but if GERD her constipation doesn't improve her symptoms and further mesenteric vasculature evaluation could be warranted. She was started on Linzess 72 g daily, Protonix 40 mg daily, follow-up in 2 weeks and office visit in 6 weeks. Continue with cirrhosis care visit.  Today she states she's still having "problems with my stomach." She ran out Linzess samples, when she was taking it she feels it worked well with 2-3 bowel movements a day consistent with [place] 4, no straining. States while on Linzess her abdominal pain improved. Is still on PPI, some breakthrough symptoms which is diet-dependent (ie- spaghetti). Denies N/V, unintentional weight loss. Is having some bloating sensation. Has LLQ abdominal pain (crampy) in the morning which improves after she has a bowel movement. Denies hematochezia, melena. Denies yellowing of skin/eyes, acute episodic confusion, darkened urine, tremors. Denies  chest pain, dyspnea, dizziness, lightheadedness, syncope, near syncope. Denies any other upper or lower GI symptoms.  Past Medical History:  Diagnosis Date  . Asthma   . Chronic back pain   . Cirrhosis ([place])    Metavir score F4 on elastography 2015  . Fibroids   . Fibromyalgia   . GERD (gastroesophageal reflux disease)   . H. pylori infection 2014   treated with pylera, had to be treated again as it was not eradicated. Urea breath test then negative after subsequent treatment.   . [name]    HCV RNA positive 09/2012  . Hypertension   . PONV (postoperative nausea and vomiting)    pt thinks maybe once she had N&V  . Sciatica of left side     Past Surgical History:  Procedure Laterality Date  . COLONOSCOPY  01/18/2008   VZC:HYIFOY rectum.  Long redundant colon, a diminutive sigmoid polyp status post cold biopsy removed. Hyperplastic polyp. Repeat colonoscopy June 2014 due to family history of colon cancer  . COLONOSCOPY WITH ESOPHAGOGASTRODUODENOSCOPY (EGD) N/A 11/02/2012   DXA:JOINOMVE gastric mucosa of doubtful, +H.pylori. Incomplete colonoscopy due to patient unable to tolerate exam, proximal colon seen. Patient refused ACBE.  Marland Kitchen COLONOSCOPY WITH PROPOFOL N/A 03/08/2013   Dr. Gala Romney: colonic polyp-removed as scribed above. Internal Hemorrhoids. Pathology did not reveal any colonic tissue, only mucus. SURVEILLANCE DUE Aug 2019  . ESOPHAGOGASTRODUODENOSCOPY  01/18/2008   RMR: Normal esophagus, normal  stomach  . ESOPHAGOGASTRODUODENOSCOPY (EGD) WITH PROPOFOL N/A 02/09/2016   Procedure: ESOPHAGOGASTRODUODENOSCOPY (EGD) WITH PROPOFOL;  Surgeon: Daneil Dolin, MD;  [facility];  Service: Endoscopy;  Laterality: N/A;  730   . HYSTEROSCOPY  05/11/2016   Procedure: HYSTEROSCOPY;  Surgeon: Jonnie Kind, MD;  [facility];  Service: Gynecology;;  . KNEE SURGERY     left knee  . MULTIPLE EXTRACTIONS WITH ALVEOLOPLASTY N/A 10/15/2013   Procedure: MULTIPLE EXTRACTION WITH  ALVEOLOPLASTY;  Surgeon: Gae Bon, DDS;  Location: [place];  Service: Oral Surgery;  Laterality: N/A;  . POLYPECTOMY N/A 03/08/2013   Procedure: POLYPECTOMY;  Surgeon: Daneil Dolin, MD;  [facility];  Service: Endoscopy;  Laterality: N/A;  . POLYPECTOMY N/A 05/11/2016   Procedure: REMOVAL OF ENDOMETRIAL POLYP;  Surgeon: Jonnie Kind, MD;  Location[facility];  Service: Gynecology;  Laterality: N/A;  . TOTAL KNEE ARTHROPLASTY Left 02/05/2015   Procedure: LEFT TOTAL KNEE ARTHROPLASTY;  Surgeon: Carole Civil, MD;  Location: [facility];  Service: Orthopedics;  Laterality: Left;    Current Outpatient Prescriptions  Medication Sig Dispense Refill  . aspirin 325 MG EC tablet Take 1 tablet (325 mg total) by mouth daily. 30 tablet 12  . budesonide-formoterol (SYMBICORT) 160-4.5 MCG/ACT inhaler Inhale 2 puffs into the lungs 2 (two) times daily.    . Cyanocobalamin (VITAMIN B 12 PO) Take 1 tablet by mouth daily.    Marland Kitchen HYDROcodone-acetaminophen (NORCO/VICODIN) 5-325 MG tablet Take 1 tablet by mouth every 6 (six) hours as needed for moderate pain. 30 tablet 0  . lisinopril (PRINIVIL,ZESTRIL) 20 MG tablet Take 1 tablet by mouth daily.    . megestrol (MEGACE) 40 MG tablet Take 40 mg by mouth daily.    . ondansetron (ZOFRAN ODT) 4 MG disintegrating tablet '4mg'$  ODT q4 hours prn nausea/vomit 12 tablet 0  . pantoprazole (PROTONIX) 40 MG tablet Take 1 tablet (40 mg total) by mouth daily. 30 tablet 11  . predniSONE (DELTASONE) 10 MG tablet Take 1 tablet by mouth 2 (two) times daily with a meal.     . promethazine (PHENERGAN) 25 MG tablet Take 1 tablet (25 mg total) by mouth every 6 (six) hours as needed for nausea or vomiting. 30 tablet 0  . Vitamin D, Ergocalciferol, (DRISDOL) 50000 units CAPS capsule Take 50,000 Units by mouth every 30 (thirty) days.    Marland Kitchen linaclotide (LINZESS) 72 MCG capsule Take 1 capsule (72 mcg total) by mouth daily before breakfast. 30 capsule 3   No current facility-administered  medications for this visit.     Allergies as of 12/13/2016 - Review Complete 12/13/2016  Allergen Reaction Noted  . Penicillins Rash     Family History  Problem Relation Age of Onset  . Colon cancer Brother 59          . Multiple myeloma Brother   . Liver cancer Sister   . Prostate cancer Brother   . Pancreatic cancer Brother   . Cancer Mother        breast  . Asthma Mother     Social History   Social History  . Marital status: Married    Spouse name: N/A  . Number of children: 0  . Years of education: N/A   Occupational History  . umemployed     Social History Main Topics  . Smoking status: Current Every Day Smoker    Packs/day: 0.50    Years: 40.00    Types: Cigarettes  . Smokeless tobacco: Never Used     Comment: Smokes one pack of cigarettes daily  . Alcohol use No  . Drug use: No     Comment: history of marijuana in past  . Sexual activity: No   Other Topics Concern  . None   Social History  Narrative  . None    Review of Systems: General: Negative for anorexia, weight loss, fever, chills, fatigue, weakness. ENT: Negative for hoarseness, difficulty swallowing. CV: Negative for chest pain, angina, palpitations, peripheral edema.  Respiratory: Negative for dyspnea at rest, cough, sputum, wheezing.  GI: See history of present illness. Neuro: Negative for memory loss, confusion.  Endo: Negative for unusual weight change.  Heme: Negative for bruising or bleeding. Allergy: Negative for rash or hives.   Physical Exam: BP (!) 150/91   Pulse 91   Temp 97.6 F (36.4 C) (Oral)   Ht '5\' 6"'$  (1.676 m)   Wt 181 lb 12.8 oz (82.5 kg)   BMI 29.34 kg/m  General:   Alert and oriented. Pleasant and cooperative. Well-nourished and well-developed.  Head:  Normocephalic and atraumatic. Eyes:  Without icterus, sclera clear and conjunctiva pink.  Ears:  Normal auditory acuity. Cardiovascular:  S1, S2 present without murmurs appreciated. Extremities without  clubbing or edema. Respiratory:  Clear to auscultation bilaterally. No wheezes, rales, or rhonchi. No distress.  Gastrointestinal:  +BS, rounded but soft, non-tender and non-distended. No HSM noted. No guarding or rebound. No masses appreciated.  Rectal:  Deferred  Musculoskalatal:  Symmetrical without gross deformities. Neurologic:  Alert and oriented x4;  grossly normal neurologically. Psych:  Alert and cooperative. Normal mood and affect. Heme/Lymph/Immune: No excessive bruising noted.    12/13/2016 9:24 AM   Disclaimer: This note was dictated with voice recognition software. Similar sounding words can inadvertently be transcribed and may not be corrected upon review.

## 2016-12-13 NOTE — Progress Notes (Signed)
CC'ED TO PCP 

## 2016-12-13 NOTE — Assessment & Plan Note (Signed)
Last CT of the abdomen noted normal appearing liver. Right upper quadrant ultrasound elastography on file 2015 showing F4 with likely cirrhosis of multifactorial etiology in the setting of a history of [name] infection status post eradication and possible alcohol related. She is generally asymptomatic from a hepatic standpoint today. She is recently had abdominal imaging. She is due for routine labs. I will check CBC, CMP, PTT/INR, AFP. Return for follow-up in 3 months related to other workup for abdominal pain and constipation as per below. Call if worsening symptoms. Routine liver care every 6 months.

## 2016-12-13 NOTE — Assessment & Plan Note (Signed)
The patient notes she has had significant improvement in her abdominal pain with Linzess. However, she ran out of samples and is requesting a prescription sent to her pharmacy. She is now having some morning lower abdominal discomfort which resolves after having a bowel movement. Has 2-3 bowel movements a day on Linzess and she is satisfied with the result. I will send in a refill prescription and give her 1 boxes samples to last until she can get to the pharmacy. Continue other medications, return for follow-up in 3 months.  At this point we can likely hold off on further evaluation of her abdominal pain such as vascular issues versus median arcuate ligament syndrome. We can reassess in 3 months.

## 2016-12-13 NOTE — Assessment & Plan Note (Signed)
GERD is well controlled on PPI. Breakthrough symptoms which are diet dependent. I will provide written information related to GERD diet. Recommend she avoid triggers. Continue PPI. Return for follow-up in 3 months.

## 2016-12-17 ENCOUNTER — Emergency Department (HOSPITAL_COMMUNITY): Payer: Medicare HMO

## 2016-12-17 ENCOUNTER — Telehealth: Payer: Self-pay

## 2016-12-17 ENCOUNTER — Encounter (HOSPITAL_COMMUNITY): Payer: Self-pay

## 2016-12-17 ENCOUNTER — Emergency Department (HOSPITAL_COMMUNITY)
Admission: EM | Admit: 2016-12-17 | Discharge: 2016-12-17 | Disposition: A | Discharge status: Home / Self Care | Payer: Medicare HMO | Attending: Emergency Medicine | Admitting: Emergency Medicine

## 2016-12-17 DIAGNOSIS — N186 End stage renal disease: Secondary | ICD-10-CM | POA: Diagnosis not present

## 2016-12-17 DIAGNOSIS — I120 Hypertensive chronic kidney disease with stage 5 chronic kidney disease or end stage renal disease: Secondary | ICD-10-CM | POA: Insufficient documentation

## 2016-12-17 DIAGNOSIS — F17210 Nicotine dependence, cigarettes, uncomplicated: Secondary | ICD-10-CM | POA: Diagnosis not present

## 2016-12-17 DIAGNOSIS — Z7982 Long term (current) use of aspirin: Secondary | ICD-10-CM | POA: Diagnosis not present

## 2016-12-17 DIAGNOSIS — Z79899 Other long term (current) drug therapy: Secondary | ICD-10-CM | POA: Diagnosis not present

## 2016-12-17 DIAGNOSIS — R1032 Left lower quadrant pain: Secondary | ICD-10-CM | POA: Diagnosis not present

## 2016-12-17 DIAGNOSIS — R1030 Lower abdominal pain, unspecified: Secondary | ICD-10-CM

## 2016-12-17 DIAGNOSIS — Z992 Dependence on renal dialysis: Secondary | ICD-10-CM | POA: Diagnosis not present

## 2016-12-17 DIAGNOSIS — R109 Unspecified abdominal pain: Secondary | ICD-10-CM | POA: Diagnosis not present

## 2016-12-17 DIAGNOSIS — K7030 Alcoholic cirrhosis of liver without ascites: Secondary | ICD-10-CM | POA: Insufficient documentation

## 2016-12-17 LAB — COMPREHENSIVE METABOLIC PANEL
ALT: 15 U/L (ref 14–54)
AST: 18 U/L (ref 15–41)
Albumin: 4.6 g/dL (ref 3.5–5.0)
Alkaline Phosphatase: 53 U/L (ref 38–126)
Anion gap: 9 (ref 5–15)
BUN: 6 mg/dL (ref 6–20)
CO2: 23 mmol/L (ref 22–32)
Calcium: 10.5 mg/dL — ABNORMAL HIGH (ref 8.9–10.3)
Chloride: 102 mmol/L (ref 101–111)
Creatinine, Ser: 0.7 mg/dL (ref 0.44–1.00)
GFR calc Af Amer: >60 mL/min (ref >60)
GFR calc non Af Amer: >60 mL/min (ref >60)
Glucose, Bld: 120 mg/dL — ABNORMAL HIGH (ref 65–99)
Potassium: 3.6 mmol/L (ref 3.5–5.1)
Sodium: 134 mmol/L — ABNORMAL LOW (ref 135–145)
Total Bilirubin: 1.2 mg/dL (ref 0.3–1.2)
Total Protein: 8.7 g/dL — ABNORMAL HIGH (ref 6.5–8.1)

## 2016-12-17 LAB — URINALYSIS, ROUTINE W REFLEX MICROSCOPIC
Bacteria, UA: NONE SEEN
Bilirubin Urine: NEGATIVE
Glucose, UA: NEGATIVE mg/dL
Ketones, ur: NEGATIVE mg/dL
Leukocytes, UA: NEGATIVE
Nitrite: NEGATIVE
Protein, ur: 100 mg/dL — AB
Specific Gravity, Urine: 1.013 (ref 1.005–1.030)
pH: 6 (ref 5.0–8.0)

## 2016-12-17 LAB — LIPASE, BLOOD: Lipase: 45 U/L (ref 11–51)

## 2016-12-17 LAB — CBC WITH DIFFERENTIAL/PLATELET
Basophils Absolute: 0 10*3/uL (ref 0.0–0.1)
Basophils Relative: 0 %
Eosinophils Absolute: 0 10*3/uL (ref 0.0–0.7)
Eosinophils Relative: 0 %
HCT: 43.5 % (ref 36.0–46.0)
Hemoglobin: 15.1 g/dL — ABNORMAL HIGH (ref 12.0–15.0)
Lymphocytes Relative: 6 %
Lymphs Abs: 0.5 10*3/uL — ABNORMAL LOW (ref 0.7–4.0)
MCH: 32.1 pg (ref 26.0–34.0)
MCHC: 34.7 g/dL (ref 30.0–36.0)
MCV: 92.4 fL (ref 78.0–100.0)
Monocytes Absolute: 0.3 10*3/uL (ref 0.1–1.0)
Monocytes Relative: 4 %
Neutro Abs: 7.6 10*3/uL (ref 1.7–7.7)
Neutrophils Relative %: 90 %
Platelets: 201 10*3/uL (ref 150–400)
RBC: 4.71 MIL/uL (ref 3.87–5.11)
RDW: 13.9 % (ref 11.5–15.5)
WBC: 8.5 10*3/uL (ref 4.0–10.5)

## 2016-12-17 MED ORDER — SODIUM CHLORIDE 0.9 % IV SOLN
INTRAVENOUS | Status: DC
  Administered 2016-12-17 (×2): via INTRAVENOUS

## 2016-12-17 MED ORDER — SODIUM CHLORIDE 0.9 % IV BOLUS (SEPSIS)
500.0000 mL | Freq: Once | INTRAVENOUS | Status: AC
  Administered 2016-12-17: 500 mL via INTRAVENOUS

## 2016-12-17 MED ORDER — HYDROMORPHONE HCL 1 MG/ML IJ SOLN
1.0000 mg | Freq: Once | INTRAMUSCULAR | Status: AC
  Administered 2016-12-17: 1 mg via INTRAVENOUS
  Filled 2016-12-17: qty 1

## 2016-12-17 MED ORDER — METOCLOPRAMIDE HCL 10 MG PO TABS: 10.0000 mg | ORAL_TABLET | Freq: Four times a day (QID) | ORAL | 0 refills | Status: DC | PRN

## 2016-12-17 MED ORDER — ONDANSETRON HCL 4 MG/2ML IJ SOLN
4.0000 mg | Freq: Once | INTRAMUSCULAR | Status: AC
  Administered 2016-12-17: 4 mg via INTRAVENOUS
  Filled 2016-12-17: qty 2

## 2016-12-17 MED ORDER — ONDANSETRON 4 MG PO TBDP: ORAL_TABLET | ORAL | 0 refills | Status: DC

## 2016-12-17 MED ORDER — METOCLOPRAMIDE HCL 5 MG/ML IJ SOLN
10.0000 mg | Freq: Once | INTRAMUSCULAR | Status: AC
  Administered 2016-12-17: 10 mg via INTRAVENOUS
  Filled 2016-12-17: qty 2

## 2016-12-17 NOTE — Telephone Encounter (Signed)
Pt called office. States she can't keep anything down and is vomiting. Said she has been vomiting all night. She said EG told her at [place] the other day that if she didn't get better he would see her and admit her. Advised pt that EG doesn't come in to the office until 10:30am and he doesn't have any openings to see her in the office today. She didn't want to go to the ER because they wouldn't do anything for her. Advised her I would send EG a message.

## 2016-12-17 NOTE — Discharge Instructions (Addendum)
Liquid diet for 3 days. Zofran as needed.  If you were given medicines take as directed.  If you are on coumadin or contraceptives realize their levels and effectiveness is altered by many different medicines.  If you have any reaction (rash, tongues swelling, other) to the medicines stop taking and see a physician.    If your blood pressure was elevated in the ER make sure you follow up for management with a primary doctor or return for chest pain, shortness of breath or stroke symptoms.  Please follow up as directed and return to the ER or see a physician for new or worsening symptoms.  Thank you. Vitals:   12/17/16 1018 12/17/16 1019  BP:  (!) 161/108  Pulse:  70  Resp:  18  Temp:  97.7 F (36.5 C)  TempSrc:  Oral  SpO2:  99%  Weight: 181 lb (82.1 kg)   Height: 5\' 6"  (1.676 m)

## 2016-12-17 NOTE — ED Provider Notes (Addendum)
[facility] DEPT Provider Note   CSN: [id number] Arrival date & time: 12/17/16  1008  By signing my name below, I, Jaquelyn Bitter., attest that this documentation has been prepared under the direction and in the presence of Elnora Morrison, MD. Electronically signed: Jaquelyn Bitter., ED Scribe. 12/17/16. 2:50 PM.   History   Chief Complaint Chief Complaint  Patient presents with  . Abdominal Pain    HPI FREDERICA CHRESTMAN is a 61 y.o. female who presents to the Emergency Department complaining of abdominal pain with onset x2 days. Pt states that she has flares of abdominal pain every x3 months and was told by her PCP that this would require surgery. She reports L sided abdominal pain, diaphoresis, chills, nausea, vomiting, diarrhea. Pt denies any modifying factors. She denies blood in stool, leg swelling and headache. Pt reports known allergy to penicillin. She denies hx of diverticulitis. Of note, pt reports hx of Hep-C that she states is resolved.                    The history is provided by the patient. No language interpreter was used.    Past Medical History:  Diagnosis Date  . Asthma   . Chronic back pain   . Cirrhosis ([place])    Metavir score F4 on elastography 2015  . Fibroids   . Fibromyalgia   . GERD (gastroesophageal reflux disease)   . H. pylori infection 2014   treated with pylera, had to be treated again as it was not eradicated. Urea breath test then negative after subsequent treatment.   . [name]    HCV RNA positive 09/2012  . Hypertension   . PONV (postoperative nausea and vomiting)    pt thinks maybe once she had N&V  . Sciatica of left side     Patient Active Problem List   Diagnosis Date Noted  . Gastroenteritis 06/14/2016  . Nausea with vomiting 06/10/2016  . Uterine enlargement 06/09/2016  . Intractable nausea and vomiting 06/08/2016  . Acute infective gastroenteritis 06/08/2016  . Diarrhea 06/08/2016  . Essential  hypertension 06/08/2016  . GERD (gastroesophageal reflux disease) 06/08/2016  . Abdominal pain   . Endometrial polyp 04/15/2016  . PMB (postmenopausal bleeding) 02/27/2016  . Alcoholic cirrhosis of liver without ascites ([place])   . Asthma 01/21/2016  . Hepatic cirrhosis ([place]) 09/22/2015  . Arthritis of knee, degenerative 02/05/2015  . History of Helicobacter pylori infection 10/30/2014  . Chronic [name] with cirrhosis ([place]) 04/11/2014  . De Quervain's disease (radial styloid tenosynovitis) 12/25/2013  . Anorexia 11/21/2012  . FH: colon cancer 11/21/2012  . Early satiety 10/25/2012  . Bowel habit changes 10/25/2012  . Abdominal pain, epigastric 10/25/2012  . Abdominal bloating 10/25/2012  . Constipation 10/25/2012  . Abnormal weight loss 10/25/2012  . Chronic viral [name] ([place]) 10/25/2012  . Radicular pain of left lower extremity 09/28/2012  . Back pain 09/28/2012  . Sciatica 08/10/2011  . S/P arthroscopy of left knee 08/10/2011  . Tibial plateau fracture 08/10/2011  . Pain in joint, lower leg 02/12/2011  . Stiffness of joint, not elsewhere classified, lower leg 02/12/2011  . 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 Meniscus, medial, derangement 12/29/2010  . CLOSED FRACTURE OF UPPER END OF TIBIA 08/12/2010    Past Surgical History:  Procedure Laterality Date  . COLONOSCOPY  01/18/2008   TDV:VOHYWV rectum.  Long redundant colon, a diminutive sigmoid polyp status post cold biopsy removed. Hyperplastic polyp. Repeat colonoscopy  June 2014 due to family history of colon cancer  . COLONOSCOPY WITH ESOPHAGOGASTRODUODENOSCOPY (EGD) N/A 11/02/2012   KDX:IPJASNKN gastric mucosa of doubtful, +H.pylori. Incomplete colonoscopy due to patient unable to tolerate exam, proximal colon seen. Patient refused ACBE.  Marland Kitchen COLONOSCOPY WITH PROPOFOL N/A 03/08/2013   Dr. Gala Romney: colonic polyp-removed as scribed above. Internal Hemorrhoids. Pathology did not reveal any colonic tissue, only mucus.  SURVEILLANCE DUE Aug 2019  . ESOPHAGOGASTRODUODENOSCOPY  01/18/2008   RMR: Normal esophagus, normal  stomach  . ESOPHAGOGASTRODUODENOSCOPY (EGD) WITH PROPOFOL N/A 02/09/2016   Procedure: ESOPHAGOGASTRODUODENOSCOPY (EGD) WITH PROPOFOL;  Surgeon: Daneil Dolin, MD;  Location: [facility];  Service: Endoscopy;  Laterality: N/A;  730   . HYSTEROSCOPY  05/11/2016   Procedure: HYSTEROSCOPY;  Surgeon: Jonnie Kind, MD;  Location: [facility] ORS;  Service: Gynecology;;  . KNEE SURGERY     left knee  . MULTIPLE EXTRACTIONS WITH ALVEOLOPLASTY N/A 10/15/2013   Procedure: MULTIPLE EXTRACTION WITH ALVEOLOPLASTY;  Surgeon: Gae Bon, DDS;  Location: [facility];  Service: Oral Surgery;  Laterality: N/A;  . POLYPECTOMY N/A 03/08/2013   Procedure: POLYPECTOMY;  Surgeon: Daneil Dolin, MD;  Location: [facility];  Service: Endoscopy;  Laterality: N/A;  . POLYPECTOMY N/A 05/11/2016   Procedure: REMOVAL OF ENDOMETRIAL POLYP;  Surgeon: Jonnie Kind, MD;  Location: [facility];  Service: Gynecology;  Laterality: N/A;  . TOTAL KNEE ARTHROPLASTY Left 02/05/2015   Procedure: LEFT TOTAL KNEE ARTHROPLASTY;  Surgeon: Carole Civil, MD;  Location: [facility];  Service: Orthopedics;  Laterality: Left;    OB History    Gravida Para Term Preterm AB Living             1   SAB TAB Ectopic Multiple Live Births                   Home Medications    Prior to Admission medications   Medication Sig Start Date End Date Taking? Authorizing Provider  aspirin 325 MG EC tablet Take 1 tablet (325 mg total) by mouth daily. 06/13/16  Yes Sinda Du, MD  budesonide-formoterol [facility]) 160-4.5 MCG/ACT inhaler Inhale 2 puffs into the lungs 2 (two) times daily.   Yes [provider]  Cyanocobalamin (VITAMIN B 12 PO) Take 1 tablet by mouth daily.   Yes [provider]  cyclobenzaprine (FLEXERIL) 10 MG tablet Take 1 tablet by mouth daily as needed for muscle spasms. 09/09/16  Yes [provider]    HYDROcodone-acetaminophen (NORCO) 7.5-325 MG tablet Take 1 tablet by mouth every 8 (eight) hours as needed for moderate pain.  11/18/16  Yes [provider]  linaclotide Rolan Lipa) 72 MCG capsule Take 1 capsule (72 mcg total) by mouth daily before breakfast. 12/13/16  Yes Carlis Stable, NP  lisinopril (PRINIVIL,ZESTRIL) 20 MG tablet Take 1 tablet by mouth daily. 09/25/15  Yes [provider]  megestrol (MEGACE) 40 MG tablet Take 40 mg by mouth daily.   Yes [provider]  pantoprazole (PROTONIX) 40 MG tablet Take 1 tablet (40 mg total) by mouth daily. 10/19/16  Yes Rourk, Cristopher Estimable, MD  promethazine (PHENERGAN) 25 MG tablet Take 1 tablet (25 mg total) by mouth every 6 (six) hours as needed for nausea or vomiting. 10/04/16  Yes [name], MD  HYDROcodone-acetaminophen (NORCO/VICODIN) 5-325 MG tablet Take 1 tablet by mouth every 6 (six) hours as needed for moderate pain. Patient not taking: Reported on 12/17/2016 12/06/16   Carole Civil, MD  metoCLOPramide (  REGLAN) 10 MG tablet Take 1 tablet (10 mg total) by mouth every 6 (six) hours as needed for nausea (nausea/headache). 12/17/16   Elnora Morrison, MD  ondansetron (ZOFRAN ODT) 4 MG disintegrating tablet '4mg'$  ODT q4 hours prn nausea/vomit 12/17/16   Elnora Morrison, MD    Family History Family History  Problem Relation Age of Onset  . Colon cancer Brother 68          . Multiple myeloma Brother   . Liver cancer Sister   . Prostate cancer Brother   . Pancreatic cancer Brother   . Cancer Mother        breast  . Asthma Mother     Social History Social History  Substance Use Topics  . Smoking status: Current Every Day Smoker    Packs/day: 0.50    Years: 40.00    Types: Cigarettes  . Smokeless tobacco: Never Used     Comment: Smokes one pack of cigarettes daily  . Alcohol use No     Allergies   Penicillins   Review of Systems Review of Systems  Constitutional: Positive for chills and diaphoresis.  Negative for fever.  Cardiovascular: Negative for leg swelling.  Gastrointestinal: Positive for abdominal pain, diarrhea, nausea and vomiting.  Neurological: Negative for headaches.  All other systems reviewed and are negative.    Physical Exam Updated Vital Signs BP 127/85 (BP Location: Left Arm)   Pulse 77   Temp 97.7 F (36.5 C) (Oral)   Resp 18   Ht '5\' 6"'$  (1.676 m)   Wt 181 lb (82.1 kg)   SpO2 100%   BMI 29.21 kg/m   Physical Exam  Constitutional: She appears well-developed and well-nourished. No distress.  HENT:  Head: Normocephalic and atraumatic.  Eyes: Conjunctivae are normal.  Neck: Neck supple.  Cardiovascular: Normal rate and regular rhythm.   No murmur heard. Pulmonary/Chest: Effort normal and breath sounds normal. No respiratory distress.  Abdominal: Soft. There is tenderness in the left lower quadrant.  No peritonitis. Pt is tender in the L mid and LLQ of the abdomen.   Musculoskeletal: She exhibits no edema.  Neurological: She is alert.  Skin: Skin is warm and dry.  Psychiatric: She has a normal mood and affect.  Nursing note and vitals reviewed.    ED Treatments / Results   DIAGNOSTIC STUDIES: Oxygen Saturation is 99% on RA, normal by my interpretation.   COORDINATION OF CARE: 2:50 PM-Discussed next steps with pt. Pt verbalized understanding and is agreeable with the plan.    Labs (all labs ordered are listed, but only abnormal results are displayed) Labs Reviewed  CBC WITH DIFFERENTIAL/PLATELET - Abnormal; Notable for the following:       Result Value   Hemoglobin 15.1 (*)    Lymphs Abs 0.5 (*)    All other components within normal limits  COMPREHENSIVE METABOLIC PANEL - Abnormal; Notable for the following:    Sodium 134 (*)    Glucose, Bld 120 (*)    Calcium 10.5 (*)    Total Protein 8.7 (*)    All other components within normal limits  URINALYSIS, ROUTINE W REFLEX MICROSCOPIC - Abnormal; Notable for the following:    Hgb urine  dipstick SMALL (*)    Protein, ur 100 (*)    Squamous Epithelial / LPF 0-5 (*)    All other components within normal limits  LIPASE, BLOOD    EKG  EKG Interpretation None       Radiology Dg Abdomen Acute  W/chest  Result Date: 12/17/2016 CLINICAL DATA:  Abdominal pain with vomiting EXAM: DG ABDOMEN ACUTE W/ 1V CHEST COMPARISON:  Abdomen series October 04, 2016; CT abdomen and pelvis October 07, 2016 FINDINGS: PA chest: Lungs are clear. Heart is upper normal in size with pulmonary vascularity within normal limits. No adenopathy. Supine and upright abdomen: There is no appreciable bowel dilatation or air-fluid level to suggest bowel obstruction. There is moderate stool in the colon. No free air. There is aortic atherosclerosis. IMPRESSION: No bowel obstruction or free air. Moderate stool in colon. Aortic atherosclerosis. No lung edema or consolidation. Electronically Signed   By: Lowella Grip III M.D.   On: 12/17/2016 14:36    Procedures Procedures (including critical care time)  Medications Ordered in ED Medications  0.9 %  sodium chloride infusion ( Intravenous New Bag/Given 12/17/16 1346)  ondansetron (ZOFRAN) injection 4 mg (4 mg Intravenous Given 12/17/16 1129)  HYDROmorphone (DILAUDID) injection 1 mg (1 mg Intravenous Given 12/17/16 1129)  sodium chloride 0.9 % bolus 500 mL (0 mLs Intravenous Stopped 12/17/16 1205)  HYDROmorphone (DILAUDID) injection 1 mg (1 mg Intravenous Given 12/17/16 1345)  metoCLOPramide (REGLAN) injection 10 mg (10 mg Intravenous Given 12/17/16 1427)     Initial Impression / Assessment and Plan / ED Course  I have reviewed the triage vital signs and the nursing notes.  Pertinent labs & imaging results that were available during my care of the patient were reviewed by me and considered in my medical decision making (see chart for details).    Patient presents with recurrent abdominal pain vomiting diarrhea since yesterday. This is similar to previous she gets  proximal every 3 months. [name] had fairly recent CT scan with no new findings. No peritonitis on exam. No history of paricentesis.  Blood work in vitals reassuring. Pain medicines given. Oral fluid challenge and plan for nausea meds and follow-up with her gastroenterologist on Monday.  Upon discharge patient having worsening pain. Repeat pain medicines ordered. Discussed the case with gastroenterologist and we reviewed records together. [name] had multiple CT scans the past year. This is similar pain. No indication for repeat CT scan. Recommend a liquid diet for 3 days note follow the patient up in the clinic.  Results and differential diagnosis were discussed with the patient/parent/guardian. Xrays were independently reviewed by myself.  Close follow up outpatient was discussed, comfortable with the plan.   Medications  0.9 %  sodium chloride infusion ( Intravenous New Bag/Given 12/17/16 1346)  ondansetron (ZOFRAN) injection 4 mg (4 mg Intravenous Given 12/17/16 1129)  HYDROmorphone (DILAUDID) injection 1 mg (1 mg Intravenous Given 12/17/16 1129)  sodium chloride 0.9 % bolus 500 mL (0 mLs Intravenous Stopped 12/17/16 1205)  HYDROmorphone (DILAUDID) injection 1 mg (1 mg Intravenous Given 12/17/16 1345)  metoCLOPramide (REGLAN) injection 10 mg (10 mg Intravenous Given 12/17/16 1427)    Vitals:   12/17/16 1018 12/17/16 1019 12/17/16 1319  BP:  (!) 161/108 127/85  Pulse:  70 77  Resp:  18 18  Temp:  97.7 F (36.5 C)   TempSrc:  Oral   SpO2:  99% 100%  Weight: 181 lb (82.1 kg)    Height: '5\' 6"'$  (1.676 m)      Final diagnoses:  Abdominal pain, lower  Alcoholic cirrhosis, unspecified whether ascites present ([place])     Final Clinical Impressions(s) / ED Diagnoses   Final diagnoses:  Abdominal pain, lower  Alcoholic cirrhosis, unspecified whether ascites present [facility])    New Prescriptions New Prescriptions  METOCLOPRAMIDE (REGLAN) 10 MG TABLET    Take 1 tablet (10 mg total) by  mouth every 6 (six) hours as needed for nausea (nausea/headache).       Elnora Morrison, MD 12/17/16 1300    Elnora Morrison, MD 12/17/16 [phone]

## 2016-12-17 NOTE — ED Triage Notes (Signed)
Pt c/o abd pain, vomiting, and diarrhea since yesterday.

## 2016-12-17 NOTE — Telephone Encounter (Signed)
Noted. No further recommendations at this time. 

## 2016-12-17 NOTE — Telephone Encounter (Signed)
EG called office and advised to call pt and see if she has taken any Phenergan and did it help. Otherwise he suggest she go to the ER. If she doesn't have any Phenergan, he will send rx to pharmacy.  Called pt. She took Phenergan but it didn't help. Advised pt that EG suggests she go to the ER. Pt said she would try to the ER. Routing to EG.

## 2016-12-18 ENCOUNTER — Encounter (HOSPITAL_COMMUNITY): Payer: Self-pay

## 2016-12-18 ENCOUNTER — Emergency Department (HOSPITAL_COMMUNITY)
Admission: EM | Admit: 2016-12-18 | Discharge: 2016-12-18 | Disposition: A | Discharge status: Home / Self Care | Payer: Medicare HMO | Attending: Emergency Medicine | Admitting: Emergency Medicine

## 2016-12-18 DIAGNOSIS — I10 Essential (primary) hypertension: Secondary | ICD-10-CM | POA: Diagnosis not present

## 2016-12-18 DIAGNOSIS — R197 Diarrhea, unspecified: Secondary | ICD-10-CM | POA: Diagnosis not present

## 2016-12-18 DIAGNOSIS — Z79899 Other long term (current) drug therapy: Secondary | ICD-10-CM | POA: Diagnosis not present

## 2016-12-18 DIAGNOSIS — Z7982 Long term (current) use of aspirin: Secondary | ICD-10-CM | POA: Insufficient documentation

## 2016-12-18 DIAGNOSIS — J45909 Unspecified asthma, uncomplicated: Secondary | ICD-10-CM | POA: Diagnosis not present

## 2016-12-18 DIAGNOSIS — R109 Unspecified abdominal pain: Secondary | ICD-10-CM | POA: Diagnosis not present

## 2016-12-18 DIAGNOSIS — R112 Nausea with vomiting, unspecified: Secondary | ICD-10-CM | POA: Insufficient documentation

## 2016-12-18 DIAGNOSIS — G8929 Other chronic pain: Secondary | ICD-10-CM | POA: Insufficient documentation

## 2016-12-18 DIAGNOSIS — F17210 Nicotine dependence, cigarettes, uncomplicated: Secondary | ICD-10-CM | POA: Diagnosis not present

## 2016-12-18 LAB — URINALYSIS, ROUTINE W REFLEX MICROSCOPIC
Bacteria, UA: NONE SEEN
Bilirubin Urine: NEGATIVE
Glucose, UA: NEGATIVE mg/dL
Ketones, ur: 5 mg/dL — AB
Leukocytes, UA: NEGATIVE
Nitrite: NEGATIVE
Protein, ur: 100 mg/dL — AB
Specific Gravity, Urine: 1.013 (ref 1.005–1.030)
WBC, UA: NONE SEEN WBC/hpf (ref 0–5)
pH: 6 (ref 5.0–8.0)

## 2016-12-18 LAB — COMPREHENSIVE METABOLIC PANEL
ALT: 17 U/L (ref 14–54)
AST: 21 U/L (ref 15–41)
Albumin: 4.8 g/dL (ref 3.5–5.0)
Alkaline Phosphatase: 54 U/L (ref 38–126)
Anion gap: 10 (ref 5–15)
BUN: 6 mg/dL (ref 6–20)
CO2: 23 mmol/L (ref 22–32)
Calcium: 10.7 mg/dL — ABNORMAL HIGH (ref 8.9–10.3)
Chloride: 103 mmol/L (ref 101–111)
Creatinine, Ser: 0.85 mg/dL (ref 0.44–1.00)
GFR calc Af Amer: >60 mL/min (ref >60)
GFR calc non Af Amer: >60 mL/min (ref >60)
Glucose, Bld: 121 mg/dL — ABNORMAL HIGH (ref 65–99)
Potassium: 3.2 mmol/L — ABNORMAL LOW (ref 3.5–5.1)
Sodium: 136 mmol/L (ref 135–145)
Total Bilirubin: 1.3 mg/dL — ABNORMAL HIGH (ref 0.3–1.2)
Total Protein: 9 g/dL — ABNORMAL HIGH (ref 6.5–8.1)

## 2016-12-18 LAB — CBC
HCT: 44.4 % (ref 36.0–46.0)
Hemoglobin: 15.6 g/dL — ABNORMAL HIGH (ref 12.0–15.0)
MCH: 32.4 pg (ref 26.0–34.0)
MCHC: 35.1 g/dL (ref 30.0–36.0)
MCV: 92.1 fL (ref 78.0–100.0)
Platelets: 202 10*3/uL (ref 150–400)
RBC: 4.82 MIL/uL (ref 3.87–5.11)
RDW: 13.8 % (ref 11.5–15.5)
WBC: 7.7 10*3/uL (ref 4.0–10.5)

## 2016-12-18 LAB — LIPASE, BLOOD: Lipase: 43 U/L (ref 11–51)

## 2016-12-18 MED ORDER — SODIUM CHLORIDE 0.9 % IV SOLN: INTRAVENOUS | Status: DC

## 2016-12-18 MED ORDER — HYDROMORPHONE HCL 1 MG/ML IJ SOLN
1.0000 mg | Freq: Once | INTRAMUSCULAR | Status: AC
  Administered 2016-12-18: 1 mg via INTRAVENOUS
  Filled 2016-12-18: qty 1

## 2016-12-18 MED ORDER — PROMETHAZINE HCL 25 MG/ML IJ SOLN
12.5000 mg | Freq: Once | INTRAMUSCULAR | Status: AC
  Administered 2016-12-18: 12.5 mg via INTRAVENOUS
  Filled 2016-12-18: qty 1

## 2016-12-18 MED ORDER — HYDROCODONE-ACETAMINOPHEN 5-325 MG PO TABS: 1.0000 | ORAL_TABLET | Freq: Four times a day (QID) | ORAL | 0 refills | Status: DC | PRN

## 2016-12-18 MED ORDER — ONDANSETRON HCL 4 MG/2ML IJ SOLN
4.0000 mg | Freq: Once | INTRAMUSCULAR | Status: AC | PRN
  Administered 2016-12-18: 4 mg via INTRAVENOUS
  Filled 2016-12-18: qty 2

## 2016-12-18 MED ORDER — ONDANSETRON 4 MG PO TBDP: 4.0000 mg | ORAL_TABLET | Freq: Three times a day (TID) | ORAL | 1 refills | Status: DC | PRN

## 2016-12-18 MED ORDER — SODIUM CHLORIDE 0.9 % IV BOLUS (SEPSIS)
500.0000 mL | Freq: Once | INTRAVENOUS | Status: AC
  Administered 2016-12-18: 500 mL via INTRAVENOUS

## 2016-12-18 NOTE — ED Triage Notes (Signed)
Pt c/o abd pain, n/v for past few days.  Was here yesterday for same.

## 2016-12-18 NOTE — Discharge Instructions (Signed)
Short course pain medicine provided. As well as antinausea medicine. Follow-up with GI medicine. Return for any new or worse symptoms.

## 2016-12-18 NOTE — ED Provider Notes (Signed)
[facility] DEPT Provider Note   CSN: [id number] Arrival date & time: 12/18/16  1020     History   Chief Complaint Chief Complaint  Patient presents with  . Abdominal Pain    HPI Colleen Lee is a 61 y.o. female.  Patient seen yesterday for same. Improved here with 2 doses of pain medication. Pain came back nausea vomiting came backs also had some loose bowel movements but no severe diarrhea. Patient followed by GI medicine for chronic abdominal pain. Think it's felt to maybe be cyclical vomiting syndrome not doing a lot of vomiting today. [name] been out of her pain medication for the past few days and cannot get it refilled until Monday. Possible this could be some withdrawal symptoms.  Reviewed yesterday's visit. They discussed the patient's situation with Dr. Sydell Axon her GI meds doctor. They recommended minimizing CT scans. Plain film showed nothing acute labs without any sig major significant abnormalities.  [name] abdominal pain is crampy in nature and, although her. Associated with nausea and vomiting and some loose bowel movements. But not the frequent diarrhea.      Past Medical History:  Diagnosis Date  . Asthma   . Chronic back pain   . Cirrhosis ([place])    Metavir score F4 on elastography 2015  . Fibroids   . Fibromyalgia   . GERD (gastroesophageal reflux disease)   . H. pylori infection 2014   treated with pylera, had to be treated again as it was not eradicated. Urea breath test then negative after subsequent treatment.   . [name]    HCV RNA positive 09/2012  . Hypertension   . PONV (postoperative nausea and vomiting)    pt thinks maybe once she had N&V  . Sciatica of left side     Patient Active Problem List   Diagnosis Date Noted  . Gastroenteritis 06/14/2016  . Nausea with vomiting 06/10/2016  . Uterine enlargement 06/09/2016  . Intractable nausea and vomiting 06/08/2016  . Acute infective gastroenteritis 06/08/2016  . Diarrhea  06/08/2016  . Essential hypertension 06/08/2016  . GERD (gastroesophageal reflux disease) 06/08/2016  . Abdominal pain   . Endometrial polyp 04/15/2016  . PMB (postmenopausal bleeding) 02/27/2016  . Alcoholic cirrhosis of liver without ascites ([place])   . Asthma 01/21/2016  . Hepatic cirrhosis ([place]) 09/22/2015  . Arthritis of knee, degenerative 02/05/2015  . History of Helicobacter pylori infection 10/30/2014  . Chronic [name] with cirrhosis ([place]) 04/11/2014  . De Quervain's disease (radial styloid tenosynovitis) 12/25/2013  . Anorexia 11/21/2012  . FH: colon cancer 11/21/2012  . Early satiety 10/25/2012  . Bowel habit changes 10/25/2012  . Abdominal pain, epigastric 10/25/2012  . Abdominal bloating 10/25/2012  . Constipation 10/25/2012  . Abnormal weight loss 10/25/2012  . Chronic viral [name] ([place]) 10/25/2012  . Radicular pain of left lower extremity 09/28/2012  . Back pain 09/28/2012  . Sciatica 08/10/2011  . S/P arthroscopy of left knee 08/10/2011  . Tibial plateau fracture 08/10/2011  . Pain in joint, lower leg 02/12/2011  . Stiffness of joint, not elsewhere classified, lower leg 02/12/2011  . 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 Meniscus, medial, derangement 12/29/2010  . CLOSED FRACTURE OF UPPER END OF TIBIA 08/12/2010    Past Surgical History:  Procedure Laterality Date  . COLONOSCOPY  01/18/2008   DGL:OVFIEP rectum.  Long redundant colon, a diminutive sigmoid polyp status post cold biopsy removed. Hyperplastic polyp. Repeat colonoscopy June 2014 due to family history of colon cancer  .  COLONOSCOPY WITH ESOPHAGOGASTRODUODENOSCOPY (EGD) N/A 11/02/2012   PNT:IRWERXVQ gastric mucosa of doubtful, +H.pylori. Incomplete colonoscopy due to patient unable to tolerate exam, proximal colon seen. Patient refused ACBE.  Marland Kitchen COLONOSCOPY WITH PROPOFOL N/A 03/08/2013   Dr. Gala Romney: colonic polyp-removed as scribed above. Internal Hemorrhoids. Pathology did not reveal any colonic  tissue, only mucus. SURVEILLANCE DUE Aug 2019  . ESOPHAGOGASTRODUODENOSCOPY  01/18/2008   RMR: Normal esophagus, normal  stomach  . ESOPHAGOGASTRODUODENOSCOPY (EGD) WITH PROPOFOL N/A 02/09/2016   Procedure: ESOPHAGOGASTRODUODENOSCOPY (EGD) WITH PROPOFOL;  Surgeon: Daneil Dolin, MD;  Location: [facility];  Service: Endoscopy;  Laterality: N/A;  730   . HYSTEROSCOPY  05/11/2016   Procedure: HYSTEROSCOPY;  Surgeon: Jonnie Kind, MD;  Location: [facility];  Service: Gynecology;;  . KNEE SURGERY     left knee  . MULTIPLE EXTRACTIONS WITH ALVEOLOPLASTY N/A 10/15/2013   Procedure: MULTIPLE EXTRACTION WITH ALVEOLOPLASTY;  Surgeon: Gae Bon, DDS;  Location: [facility];  Service: Oral Surgery;  Laterality: N/A;  . POLYPECTOMY N/A 03/08/2013   Procedure: POLYPECTOMY;  Surgeon: Daneil Dolin, MD;  Location: [facility];  Service: Endoscopy;  Laterality: N/A;  . POLYPECTOMY N/A 05/11/2016   Procedure: REMOVAL OF ENDOMETRIAL POLYP;  Surgeon: Jonnie Kind, MD;  Location: [facility];  Service: Gynecology;  Laterality: N/A;  . TOTAL KNEE ARTHROPLASTY Left 02/05/2015   Procedure: LEFT TOTAL KNEE ARTHROPLASTY;  Surgeon: Carole Civil, MD;  Location: [facility];  Service: Orthopedics;  Laterality: Left;    OB History    Gravida Para Term Preterm AB Living             1   SAB TAB Ectopic Multiple Live Births                   Home Medications    Prior to Admission medications   Medication Sig Start Date End Date Taking? Authorizing Provider  aspirin 325 MG EC tablet Take 1 tablet (325 mg total) by mouth daily. 06/13/16  Yes Sinda Du, MD  budesonide-formoterol [facility]) 160-4.5 MCG/ACT inhaler Inhale 2 puffs into the lungs 2 (two) times daily.   Yes [provider]  Cyanocobalamin (VITAMIN B 12 PO) Take 1 tablet by mouth daily.   Yes [provider]  cyclobenzaprine (FLEXERIL) 10 MG tablet Take 1 tablet by mouth daily as needed for muscle spasms. 09/09/16  Yes [provider]  HYDROcodone-acetaminophen (NORCO) 7.5-325 MG tablet Take 1 tablet by mouth every 8 (eight) hours as needed for moderate pain.  11/18/16  Yes [provider]  linaclotide Rolan Lipa) 72 MCG capsule Take 1 capsule (72 mcg total) by mouth daily before breakfast. 12/13/16  Yes Carlis Stable, NP  lisinopril (PRINIVIL,ZESTRIL) 20 MG tablet Take 1 tablet by mouth daily. 09/25/15  Yes [provider]  megestrol (MEGACE) 40 MG tablet Take 40 mg by mouth daily.   Yes [provider]  metoCLOPramide (REGLAN) 10 MG tablet Take 1 tablet (10 mg total) by mouth every 6 (six) hours as needed for nausea (nausea/headache). 12/17/16  Yes Elnora Morrison, MD  ondansetron (ZOFRAN ODT) 4 MG disintegrating tablet '4mg'$  ODT q4 hours prn nausea/vomit 12/17/16  Yes Elnora Morrison, MD  pantoprazole (PROTONIX) 40 MG tablet Take 1 tablet (40 mg total) by mouth daily. 10/19/16  Yes Rourk, Cristopher Estimable, MD  promethazine (PHENERGAN) 25 MG tablet Take 1 tablet (25 mg total) by mouth every 6 (six) hours as needed for nausea or vomiting. 10/04/16  Yes Yelverton,  Shanon Brow, MD  HYDROcodone-acetaminophen (NORCO/VICODIN) 5-325 MG tablet Take 1 tablet by mouth every 6 (six) hours as needed for moderate pain. Patient not taking: Reported on 12/17/2016 12/06/16   Carole Civil, MD  HYDROcodone-acetaminophen (NORCO/VICODIN) 5-325 MG tablet Take 1-2 tablets by mouth every 6 (six) hours as needed. 12/18/16   Fredia Sorrow, MD  ondansetron (ZOFRAN ODT) 4 MG disintegrating tablet Take 1 tablet (4 mg total) by mouth every 8 (eight) hours as needed. 12/18/16   Fredia Sorrow, MD    Family History Family History  Problem Relation Age of Onset  . Colon cancer Brother 38          . Multiple myeloma Brother   . Liver cancer Sister   . Prostate cancer Brother   . Pancreatic cancer Brother   . Cancer Mother        breast  . Asthma Mother     Social History Social History  Substance Use Topics  . Smoking status: Current  Every Day Smoker    Packs/day: 0.50    Years: 40.00    Types: Cigarettes  . Smokeless tobacco: Never Used     Comment: Smokes one pack of cigarettes daily  . Alcohol use No     Allergies   Penicillins   Review of Systems Review of Systems  Constitutional: Negative for fever.  HENT: Negative for congestion.   Eyes: Negative for redness.  Respiratory: Negative for shortness of breath.   Cardiovascular: Negative for chest pain.  Gastrointestinal: Positive for abdominal pain, nausea and vomiting.  Genitourinary: Negative for dysuria.  Musculoskeletal: Negative for back pain.  Skin: Negative for rash.  Neurological: Negative for headaches.  Hematological: Does not bruise/bleed easily.  Psychiatric/Behavioral: Negative for confusion.     Physical Exam Updated Vital Signs BP (!) 166/88 (BP Location: Left Arm)   Pulse 72   Temp 98.5 F (36.9 C) (Oral)   Resp 18   Ht '5\' 6"'$  (1.676 m)   Wt 181 lb (82.1 kg)   SpO2 96%   BMI 29.21 kg/m   Physical Exam  Constitutional: She is oriented to person, place, and time. She appears well-developed and well-nourished. No distress.  HENT:  Head: Normocephalic and atraumatic.  Mouth/Throat: Oropharynx is clear and moist.  Eyes: EOM are normal. Pupils are equal, round, and reactive to light.  Neck: Normal range of motion.  Cardiovascular: Normal rate, regular rhythm and normal heart sounds.   Pulmonary/Chest: Effort normal and breath sounds normal. No respiratory distress.  Abdominal: Soft. Bowel sounds are normal. She exhibits distension. There is no tenderness.  Musculoskeletal: Normal range of motion.  Neurological: She is alert and oriented to person, place, and time. No cranial nerve deficit or sensory deficit. She exhibits normal muscle tone. Coordination normal.  Skin: Skin is warm.  Nursing note and vitals reviewed.    ED Treatments / Results  Labs (all labs ordered are listed, but only abnormal results are  displayed) Labs Reviewed  COMPREHENSIVE METABOLIC PANEL - Abnormal; Notable for the following:       Result Value   Potassium 3.2 (*)    Glucose, Bld 121 (*)    Calcium 10.7 (*)    Total Protein 9.0 (*)    Total Bilirubin 1.3 (*)    All other components within normal limits  CBC - Abnormal; Notable for the following:    Hemoglobin 15.6 (*)    All other components within normal limits  URINALYSIS, ROUTINE W REFLEX MICROSCOPIC - Abnormal; Notable  for the following:    Hgb urine dipstick SMALL (*)    Ketones, ur 5 (*)    Protein, ur 100 (*)    Squamous Epithelial / LPF 0-5 (*)    All other components within normal limits  LIPASE, BLOOD    EKG  EKG Interpretation None       Radiology Dg Abdomen Acute W/chest  Result Date: 12/17/2016 CLINICAL DATA:  Abdominal pain with vomiting EXAM: DG ABDOMEN ACUTE W/ 1V CHEST COMPARISON:  Abdomen series October 04, 2016; CT abdomen and pelvis October 07, 2016 FINDINGS: PA chest: Lungs are clear. Heart is upper normal in size with pulmonary vascularity within normal limits. No adenopathy. Supine and upright abdomen: There is no appreciable bowel dilatation or air-fluid level to suggest bowel obstruction. There is moderate stool in the colon. No free air. There is aortic atherosclerosis. IMPRESSION: No bowel obstruction or free air. Moderate stool in colon. Aortic atherosclerosis. No lung edema or consolidation. Electronically Signed   By: Lowella Grip III M.D.   On: 12/17/2016 14:36    Procedures Procedures (including critical care time)  Medications Ordered in ED Medications  0.9 %  sodium chloride infusion (not administered)  ondansetron (ZOFRAN) injection 4 mg (4 mg Intravenous Given 12/18/16 1058)  sodium chloride 0.9 % bolus 500 mL (0 mLs Intravenous Stopped 12/18/16 1401)  HYDROmorphone (DILAUDID) injection 1 mg (1 mg Intravenous Given 12/18/16 1306)  promethazine (PHENERGAN) injection 12.5 mg (12.5 mg Intravenous Given 12/18/16 1307)      Initial Impression / Assessment and Plan / ED Course  I have reviewed the triage vital signs and the nursing notes.  Pertinent labs & imaging results that were available during my care of the patient were reviewed by me and considered in my medical decision making (see chart for details).     Patient with history of chronic abdominal pain. Felt to be kind of gastroparesis but today's presentation not really classic for that. Almost wondering whether this is a little bit of withdrawal from her pain medication that she ran out of. And cannot get filled again until Monday.  Labs here without any significant changes compared to yesterday. Patient had plain films yesterday. That showed no acute findings. Provider yesterday talked to Dr. Sydell Axon about the patient they recommended minimizing CT scans could she's had so many. Based on labs being normal today and her presentation and significant improvement with pain medicine do not feel that CT of the abdomen is required. There was no acute abdominal findings on exam.  Renewed a short course of hydrocodone and renewed Zofran for her. She will follow-up with GI medicine.  Only significant lab the abdomen only was some mild hypokalemia.  Final Clinical Impressions(s) / ED Diagnoses   Final diagnoses:  Chronic abdominal pain  Intractable vomiting with nausea, unspecified vomiting type  Nausea vomiting and diarrhea    New Prescriptions New Prescriptions   HYDROCODONE-ACETAMINOPHEN (NORCO/VICODIN) 5-325 MG TABLET    Take 1-2 tablets by mouth every 6 (six) hours as needed.   ONDANSETRON (ZOFRAN ODT) 4 MG DISINTEGRATING TABLET    Take 1 tablet (4 mg total) by mouth every 8 (eight) hours as needed.     Fredia Sorrow, MD 12/18/16 1410

## 2016-12-20 ENCOUNTER — Telehealth: Payer: Self-pay | Admitting: Orthopedic Surgery

## 2016-12-20 ENCOUNTER — Other Ambulatory Visit: Payer: Self-pay | Admitting: Orthopedic Surgery

## 2016-12-20 MED ORDER — HYDROCODONE-ACETAMINOPHEN 5-325 MG PO TABS: 1.0000 | ORAL_TABLET | Freq: Three times a day (TID) | ORAL | 0 refills | Status: DC | PRN

## 2016-12-20 NOTE — Telephone Encounter (Signed)
ROUTING TO DR HARRISON FOR APPROVAL 

## 2016-12-20 NOTE — Telephone Encounter (Signed)
Patient called to request refill, pain medication.  Discussed refill of this medication by Emergency room physician.  Patient states was enough pills only for weekend: HYDROcodone-acetaminophen (NORCO/VICODIN) 5-325 MG tablet 30 tablet

## 2016-12-24 DIAGNOSIS — K219 Gastro-esophageal reflux disease without esophagitis: Secondary | ICD-10-CM | POA: Diagnosis not present

## 2016-12-24 DIAGNOSIS — F17210 Nicotine dependence, cigarettes, uncomplicated: Secondary | ICD-10-CM | POA: Diagnosis not present

## 2016-12-24 DIAGNOSIS — J449 Chronic obstructive pulmonary disease, unspecified: Secondary | ICD-10-CM | POA: Diagnosis not present

## 2016-12-24 DIAGNOSIS — I10 Essential (primary) hypertension: Secondary | ICD-10-CM | POA: Diagnosis not present

## 2016-12-24 DIAGNOSIS — F17208 Nicotine dependence, unspecified, with other nicotine-induced disorders: Secondary | ICD-10-CM | POA: Diagnosis not present

## 2016-12-28 ENCOUNTER — Encounter: Payer: Self-pay | Admitting: Orthopedic Surgery

## 2016-12-28 ENCOUNTER — Ambulatory Visit (INDEPENDENT_AMBULATORY_CARE_PROVIDER_SITE_OTHER): Payer: Medicare HMO | Admitting: Orthopedic Surgery

## 2016-12-28 DIAGNOSIS — M5410 Radiculopathy, site unspecified: Secondary | ICD-10-CM | POA: Diagnosis not present

## 2016-12-28 DIAGNOSIS — G894 Chronic pain syndrome: Secondary | ICD-10-CM

## 2016-12-28 MED ORDER — HYDROCODONE-ACETAMINOPHEN 5-325 MG PO TABS: 1.0000 | ORAL_TABLET | Freq: Three times a day (TID) | ORAL | 0 refills | Status: DC | PRN

## 2016-12-28 NOTE — Progress Notes (Signed)
Progress Note   Patient ID: Colleen Lee, female   DOB: 07/23/1956, 61 y.o.   MRN: [id number]  Chief Complaint  Patient presents with  . Follow-up    CHRONIC PAIN    HPI Colleen Lee is a 61 y.o. female.   61 yo female with chronic pain  She is functional with adls with norco 5 mg She wants to see neurosurgery again as her back pain is increasing along with the leg pain     Review of Systems Review of Systems  Musculoskeletal: Positive for back pain and myalgias.  Neurological: Positive for numbness.    Examination There were no vitals taken for this visit.  Gen. appearance the patient's appearance is normal with normal grooming and  hygiene The patient is oriented to person place and time Mood and affect are normal   Ortho Exam  Medical decision-making Diagnosis, Data, Plan (risk)  Encounter Diagnoses  Name Primary?  . Chronic pain syndrome Yes  . Radicular pain of left lower extremity    Pain is worse   Meds ordered this encounter  Medications  . HYDROcodone-acetaminophen (NORCO/VICODIN) 5-325 MG tablet    Sig: Take 1 tablet by mouth every 8 (eight) hours as needed for moderate pain. Must last 10 days    Dispense:  30 tablet    Refill:  0   Fu as needed    Arther Abbott, MD 12/28/2016 9:10 AM

## 2016-12-28 NOTE — Patient Instructions (Signed)
What You Need to Know About Prescription Opioid Pain Medicine Opioids are powerful medicines that are used to treat moderate to severe pain. Opioids should be taken with the supervision of a trained health care provider. They should be taken for the shortest period of time as possible. This is because opioids can be addictive and the longer you take opioids, the greater your risk of addiction (opioid use disorder). What do opioids do? Opioids help reduce or eliminate pain. When used for short periods of time, they can help you:  Sleep better.  Do better in physical or occupational therapy.  Feel better in the first few days after an injury.  Recover from surgery.  What kind of problems can opioids cause? Opioids can cause side effects, such as:  Constipation.  Nausea.  Vomiting.  Drowsiness.  Confusion.  Opioid use disorder.  Breathing difficulties (respiratory depression).  Using opioid pain medicines for longer than 3 days increases your risk of these side effects. Taking opioid pain medicine for a long period of time can affect your ability to do daily tasks. It also puts you at risk for:  Car accidents.  Heart attack.  Overdose, which can sometimes lead to death.  What can increase my risk for developing problems while taking opioids? You may be at an especially high risk for problems while taking opioids if you:  Are over the age of 65.  Are pregnant.  Have kidney or liver disease.  Have certain mental health conditions, such as depression or anxiety.  Have a history of substance use disorder.  Have had an opioid overdose in the past.  How do I stop taking opioids if I have been taking them for a long time? If you have been taking opioid medicine for more than a few weeks, you may need to slowly stop taking them (taper). Tapering your use of opioids can decrease your chances of experiencing withdrawal symptoms, such as:  Abdominal pain and  cramping.  Nausea.  Sweating.  Sleepiness.  Restlessness.  Uncontrollable shaking (tremors).  Cravings for the medicine.  Do not attempt to taper your use of opioids on your own. Talk with your health care provider about how to do this. Your health care provider may prescribe a step-down schedule based on how much medicine you are taking and how long you have been taking it. What are the benefits of stopping the use of opioids? By switching from opioid pain medicine to non-opioid pain management options, you will decrease your risk of accidents and injuries associated with long-term opioid use. You will also be able to:  Monitor your pain more accurately and know when to seek medical care if it is not improving.  Decrease risk to others around you. Having opioids in the home increases the risk for accidental or intentional use or overdose by others.  How can I treat pain without opioids? Pain can be managed with many types of alternative treatments. Ask your health care provider to refer you to one or more specialists who can help you manage pain through:  Physical or occupational therapy.  Counseling (cognitive-behavioral therapy).  Good nutrition.  Biofeedback.  Massage.  Meditation.  Non-opioid medicine.  Following a gentle exercise program.  Where can I get support? If you have been taking opioids for a long time, you may benefit from receiving support for quitting from a local support group or counselor. Ask your health care provider for a referral to these resources in your area. When should I   seek medical care? Seek medical care right away if you are taking opioids and you experience any of the following:  Difficulty breathing.  Breathing that is more shallow or slower than normal.  A very slow heartbeat (pulse).  Severe confusion.  Unconsciousness.  Sleepiness.  Difficulty waking from sleep.  Slurred speech.  Nausea and vomiting.  Cold, clammy  skin.  Blue lips or fingernails.  Limpness.  Abnormally small pupils.  If you think that you or someone else may have taken too much of an opioid medicine, get medical help right away. Do not wait to see if the symptoms go away on their own. Call your local emergency services (911 in the [place] or call the hotline of the National Poison Control Center (800-222-1222 in the [place] Where can I get more information? To learn more about opioid medicines, visit the Centers for Disease Control and Prevention web site Opioid Basics at https://www.cdc.gov/drugoverdos/opioids/index.html. Summary  Opioid medicines can help you manage moderate-to-severe pain for a short period of time.  Taking opioid pain medicine for a long period of time puts you at risk for unintentional accidents, injury, and even death.  If you think that you or someone else may have taken too much of an opioid, get medical help right away. This information is not intended to replace advice given to you by your health care provider. Make sure you discuss any questions you have with your health care provider. Document Released: 08/15/2015 Document Revised: 03/12/2016 Document Reviewed: 02/28/2015 Elsevier Interactive Patient Education  2017 [employer] Inc.  

## 2017-03-15 ENCOUNTER — Ambulatory Visit: Payer: Medicare HMO | Admitting: Nurse Practitioner

## 2017-03-17 ENCOUNTER — Encounter: Payer: Self-pay | Admitting: Gastroenterology

## 2017-03-17 ENCOUNTER — Ambulatory Visit (INDEPENDENT_AMBULATORY_CARE_PROVIDER_SITE_OTHER): Payer: Medicare HMO | Admitting: Gastroenterology

## 2017-03-17 VITALS — BP 174/90 | HR 73 | Temp 98.3°F | Ht 66.0 in | Wt 188.2 lb

## 2017-03-17 DIAGNOSIS — K5900 Constipation, unspecified: Secondary | ICD-10-CM | POA: Diagnosis not present

## 2017-03-17 DIAGNOSIS — K7460 Unspecified cirrhosis of liver: Secondary | ICD-10-CM | POA: Diagnosis not present

## 2017-03-17 NOTE — Assessment & Plan Note (Signed)
61 year old female with chronic constipation, not ideally managed with Linzess 72 mcg. She has improved abdominal discomfort after defecation, consistent with IBS-C. Abdomen appears quite round today in clinic but is soft on exam. No alarm features on exam. I feel this is multifactorial in setting of chronic constipation, weight gain. Does not appear to clinically have ascites. I've ordered an abdominal xray to assess stool burden today. Start Amitiza 24 mcg po BID. Return in 3 months. Discussed cessation of large amount mountain dews she is drinking a day. Discussed this was a large contributor to her symptoms.

## 2017-03-17 NOTE — Assessment & Plan Note (Signed)
Treated for Hep C, question of ETOH component. Needs routine US abdomen and labs in future. Next EGD July 2019. As of note, colonoscopy also due in 2019 for surveillance purposes. Return in 3 months.

## 2017-03-17 NOTE — Patient Instructions (Signed)
Please have blood work and xray done today.   For constipation: start taking Amitiza 1 gelcap WITH FOOD twice a day to avoid nausea. Let me know how this works.  We will also schedule an ultrasound in the future.  Stop drinking [employer] :) This can cause weight gain, bloating, fluid retention.   We will see you in 3 months!

## 2017-03-17 NOTE — Progress Notes (Signed)
CC'D TO PCP °

## 2017-03-17 NOTE — Progress Notes (Signed)
Referring Provider: Rosita Fire, MD Primary Care Physician:  Rosita Fire, MD Primary GI: Dr. Gala Romney  Chief Complaint  Patient presents with  . Gastroesophageal Reflux  . Abdominal Pain    relieved with bm  . Constipation    has urge, takes while to get started (not hard)    HPI:   Colleen Lee is a 61 y.o. 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 She has gained weight. Dealing with chronic constipation. Continues to gain weight. Had been given Linzess 72 mcg at last visit.    EGD July 2017. Next in July 2019. Colonoscopy in 2019 due for surveillance. Has to strain to have BMs. Taking hydrocodone for knee. Abdominal pain relieved somewhat after having a BM but not completely resolved. Feels like needle sticks in her abdomen and like the onset of a period. Stomach feels swollen upper abdomen. Linzess 72 mcg once daily. Higher dose didn't agree with her. Got nauseated with higher dosing and cramping. Patient is gaining weight. Good appetite as long as she isn't hurting. DRINKS SIX MOUNTAIN DEW CANS A DAY.   Past Medical History:  Diagnosis Date  . Asthma   . Chronic back pain   . Cirrhosis ([place])    Metavir score F4 on elastography 2015  . Fibroids   . Fibromyalgia   . GERD (gastroesophageal reflux disease)   . H. pylori infection 2014   treated with pylera, had to be treated again as it was not eradicated. Urea breath test then negative after subsequent treatment.   . [name]    HCV RNA positive 09/2012  . Hypertension   . PONV (postoperative nausea and vomiting)    pt thinks maybe once she had N&V  . Sciatica of left side     Past Surgical History:  Procedure Laterality Date  . COLONOSCOPY  01/18/2008   ALP:[id number] rectum.  Long redundant colon, a diminutive sigmoid polyp status post cold biopsy  removed. Hyperplastic polyp. Repeat colonoscopy June 2014 due to family history of colon cancer  . COLONOSCOPY WITH ESOPHAGOGASTRODUODENOSCOPY (EGD) N/A 11/02/2012   OXB:[id number] gastric mucosa of doubtful, +H.pylori. Incomplete colonoscopy due to patient unable to tolerate exam, proximal colon seen. Patient refused ACBE.  Marland Kitchen COLONOSCOPY WITH PROPOFOL N/A 03/08/2013   Dr. Gala Romney: colonic polyp-removed as scribed above. Internal Hemorrhoids. Pathology did not reveal any colonic tissue, only mucus. SURVEILLANCE DUE Aug 2019  . ESOPHAGOGASTRODUODENOSCOPY  01/18/2008   RMR: Normal esophagus, normal  stomach  . ESOPHAGOGASTRODUODENOSCOPY (EGD) WITH PROPOFOL N/A 02/09/2016   Normal esophagus, small hiatal hernia, portal hypertensive gastropathy, normal second portion of duodenum. Due in July 2019  . HYSTEROSCOPY  05/11/2016   Procedure: HYSTEROSCOPY;  Surgeon: Jonnie Kind, MD;  Location: [facility];  Service: Gynecology;;  . KNEE SURGERY     left knee  . MULTIPLE EXTRACTIONS WITH ALVEOLOPLASTY N/A 10/15/2013   Procedure: MULTIPLE EXTRACTION WITH ALVEOLOPLASTY;  Surgeon: Gae Bon, DDS;  Location: [facility];  Service: Oral Surgery;  Laterality: N/A;  . POLYPECTOMY N/A 03/08/2013   Procedure: POLYPECTOMY;  Surgeon: Daneil Dolin, MD;  Location: [facility];  Service: Endoscopy;  Laterality: N/A;  . POLYPECTOMY N/A 05/11/2016   Procedure: REMOVAL OF ENDOMETRIAL POLYP;  Surgeon: Jonnie Kind, MD;  Location: [facility];  Service: Gynecology;  Laterality: N/A;  . TOTAL KNEE ARTHROPLASTY Left 02/05/2015   Procedure: LEFT  TOTAL KNEE ARTHROPLASTY;  Surgeon: Carole Civil, MD;  Location: [facility];  Service: Orthopedics;  Laterality: Left;    Current Outpatient Prescriptions  Medication Sig Dispense Refill  . aspirin 325 MG EC tablet Take 1 tablet (325 mg total) by mouth daily. 30 tablet 12  . budesonide-formoterol (SYMBICORT) 160-4.5 MCG/ACT inhaler Inhale 2 puffs into the lungs 2 (two) times daily.    .  Cyanocobalamin (VITAMIN B 12 PO) Take 1 tablet by mouth daily.    . cyclobenzaprine (FLEXERIL) 10 MG tablet Take 1 tablet by mouth daily as needed for muscle spasms.    Marland Kitchen HYDROcodone-acetaminophen (NORCO/VICODIN) 5-325 MG tablet Take 1 tablet by mouth every 8 (eight) hours as needed for moderate pain. Must last 10 days 30 tablet 0  . linaclotide (LINZESS) 72 MCG capsule Take 1 capsule (72 mcg total) by mouth daily before breakfast. 30 capsule 3  . lisinopril (PRINIVIL,ZESTRIL) 20 MG tablet Take 1 tablet by mouth daily.    . megestrol (MEGACE) 40 MG tablet Take 40 mg by mouth daily.    . metoCLOPramide (REGLAN) 10 MG tablet Take 1 tablet (10 mg total) by mouth every 6 (six) hours as needed for nausea (nausea/headache). 6 tablet 0  . ondansetron (ZOFRAN ODT) 4 MG disintegrating tablet 35m ODT q4 hours prn nausea/vomit 12 tablet 0  . ondansetron (ZOFRAN ODT) 4 MG disintegrating tablet Take 1 tablet (4 mg total) by mouth every 8 (eight) hours as needed. 10 tablet 1  . pantoprazole (PROTONIX) 40 MG tablet Take 1 tablet (40 mg total) by mouth daily. 30 tablet 11  . promethazine (PHENERGAN) 25 MG tablet Take 1 tablet (25 mg total) by mouth every 6 (six) hours as needed for nausea or vomiting. 30 tablet 0   No current facility-administered medications for this visit.     Allergies as of 03/17/2017 - Review Complete 03/17/2017  Allergen Reaction Noted  . Penicillins Rash     Family History  Problem Relation Age of Onset  . Colon cancer Brother 569         . Multiple myeloma Brother   . Liver cancer Sister   . Prostate cancer Brother   . Pancreatic cancer Brother   . Cancer Mother        breast  . Asthma Mother     Social History   Social History  . Marital status: Married    Spouse name: N/A  . Number of children: 0  . Years of education: N/A   Occupational History  . umemployed     Social History Main Topics  . Smoking status: Current Every Day Smoker    Packs/day: 0.50     Years: 40.00    Types: Cigarettes  . Smokeless tobacco: Never Used     Comment: Smokes one pack of cigarettes daily  . Alcohol use No  . Drug use: No     Comment: history of marijuana in past  . Sexual activity: No   Other Topics Concern  . None   Social History Narrative  . None    Review of Systems: As mentioned in HPI   Physical Exam: BP (!) 174/90   Pulse 73   Temp 98.3 F (36.8 C) (Oral)   Ht _0  (1.676 m)   Wt 188 lb 3.2 oz (85.4 kg)   BMI 30.38 kg/m  General:   Alert and oriented. No distress noted. Pleasant and cooperative.  Head:  Normocephalic and atraumatic. Eyes:  Conjuctiva clear without  scleral icterus. Mouth:  Oral mucosa pink and moist.  Abdomen:  +BS, distended, full but soft, very mild TTP upper abdomen. No rebound or guarding. No HSM or masses noted. Msk:  Symmetrical without gross deformities. Normal posture. Extremities:  Without edema. Neurologic:  Alert and  oriented x4 Psych:  Alert and cooperative. Normal mood and affect.

## 2017-03-21 DIAGNOSIS — K7460 Unspecified cirrhosis of liver: Secondary | ICD-10-CM | POA: Diagnosis not present

## 2017-03-22 ENCOUNTER — Ambulatory Visit (HOSPITAL_COMMUNITY)
Admission: RE | Admit: 2017-03-22 | Discharge: 2017-03-22 | Disposition: A | Discharge status: Home / Self Care | Payer: Medicare HMO | Source: Ambulatory Visit | Attending: Gastroenterology | Admitting: Gastroenterology

## 2017-03-22 ENCOUNTER — Other Ambulatory Visit: Payer: Self-pay | Admitting: Orthopedic Surgery

## 2017-03-22 DIAGNOSIS — M160 Bilateral primary osteoarthritis of hip: Secondary | ICD-10-CM | POA: Diagnosis not present

## 2017-03-22 DIAGNOSIS — D259 Leiomyoma of uterus, unspecified: Secondary | ICD-10-CM | POA: Insufficient documentation

## 2017-03-22 DIAGNOSIS — K5900 Constipation, unspecified: Secondary | ICD-10-CM

## 2017-03-22 DIAGNOSIS — K7460 Unspecified cirrhosis of liver: Secondary | ICD-10-CM | POA: Insufficient documentation

## 2017-03-22 LAB — COMPLETE METABOLIC PANEL WITH GFR
ALT: 12 U/L (ref 6–29)
AST: 16 U/L (ref 10–35)
Albumin: 4.4 g/dL (ref 3.6–5.1)
Alkaline Phosphatase: 57 U/L (ref 33–130)
BUN: 13 mg/dL (ref 7–25)
CO2: 19 mmol/L — ABNORMAL LOW (ref 20–32)
Calcium: 10.2 mg/dL (ref 8.6–10.4)
Chloride: 108 mmol/L (ref 98–110)
Creat: 0.91 mg/dL (ref 0.50–0.99)
GFR, Est African American: 79 mL/min (ref >=60)
GFR, Est Non African American: 68 mL/min (ref >=60)
Glucose, Bld: 79 mg/dL (ref 65–99)
Potassium: 4.5 mmol/L (ref 3.5–5.3)
Sodium: 138 mmol/L (ref 135–146)
Total Bilirubin: 0.6 mg/dL (ref 0.2–1.2)
Total Protein: 7.6 g/dL (ref 6.1–8.1)

## 2017-03-22 LAB — CBC WITH DIFFERENTIAL/PLATELET
Basophils Absolute: 0 cells/uL (ref 0–200)
Basophils Relative: 0 %
Eosinophils Absolute: 225 cells/uL (ref 15–500)
Eosinophils Relative: 3 %
HCT: 43.5 % (ref 35.0–45.0)
Hemoglobin: 14.4 g/dL (ref 11.7–15.5)
Lymphocytes Relative: 34 %
Lymphs Abs: 2550 cells/uL (ref 850–3900)
MCH: 31 pg (ref 27.0–33.0)
MCHC: 33.3 g/dL (ref 32.0–36.0)
MCV: 93.8 fL (ref 80.0–100.0)
MPV: 11.2 fL (ref 7.5–12.5)
Monocytes Absolute: 750 cells/uL (ref 200–950)
Monocytes Relative: 10 %
Neutro Abs: 3975 cells/uL (ref 1500–7800)
Neutrophils Relative %: 53 %
Platelets: 225 10*3/uL (ref 140–400)
RBC: 4.64 MIL/uL (ref 3.80–5.10)
RDW: 14.3 % (ref 11.0–15.0)
WBC: 7.5 10*3/uL (ref 3.8–10.8)

## 2017-03-22 LAB — PROTIME-INR
INR: 0.9
Prothrombin Time: 9.7 s (ref 9.0–11.5)

## 2017-03-23 DIAGNOSIS — K7469 Other cirrhosis of liver: Secondary | ICD-10-CM | POA: Diagnosis not present

## 2017-03-23 DIAGNOSIS — Z1389 Encounter for screening for other disorder: Secondary | ICD-10-CM | POA: Diagnosis not present

## 2017-03-23 DIAGNOSIS — J449 Chronic obstructive pulmonary disease, unspecified: Secondary | ICD-10-CM | POA: Diagnosis not present

## 2017-03-23 DIAGNOSIS — I10 Essential (primary) hypertension: Secondary | ICD-10-CM | POA: Diagnosis not present

## 2017-03-23 DIAGNOSIS — E784 Other hyperlipidemia: Secondary | ICD-10-CM | POA: Diagnosis not present

## 2017-03-26 ENCOUNTER — Emergency Department (HOSPITAL_COMMUNITY)
Admission: EM | Admit: 2017-03-26 | Discharge: 2017-03-26 | Disposition: A | Discharge status: Home / Self Care | Payer: Medicare HMO | Attending: Emergency Medicine | Admitting: Emergency Medicine

## 2017-03-26 ENCOUNTER — Emergency Department (HOSPITAL_COMMUNITY): Payer: Medicare HMO

## 2017-03-26 ENCOUNTER — Encounter (HOSPITAL_COMMUNITY): Payer: Self-pay | Admitting: Emergency Medicine

## 2017-03-26 DIAGNOSIS — R1110 Vomiting, unspecified: Secondary | ICD-10-CM | POA: Insufficient documentation

## 2017-03-26 DIAGNOSIS — Z7982 Long term (current) use of aspirin: Secondary | ICD-10-CM | POA: Diagnosis not present

## 2017-03-26 DIAGNOSIS — K7469 Other cirrhosis of liver: Secondary | ICD-10-CM | POA: Diagnosis not present

## 2017-03-26 DIAGNOSIS — F17210 Nicotine dependence, cigarettes, uncomplicated: Secondary | ICD-10-CM | POA: Insufficient documentation

## 2017-03-26 DIAGNOSIS — Z79899 Other long term (current) drug therapy: Secondary | ICD-10-CM | POA: Diagnosis not present

## 2017-03-26 DIAGNOSIS — R1010 Upper abdominal pain, unspecified: Secondary | ICD-10-CM | POA: Insufficient documentation

## 2017-03-26 DIAGNOSIS — J45909 Unspecified asthma, uncomplicated: Secondary | ICD-10-CM | POA: Diagnosis not present

## 2017-03-26 DIAGNOSIS — I10 Essential (primary) hypertension: Secondary | ICD-10-CM | POA: Insufficient documentation

## 2017-03-26 DIAGNOSIS — R1013 Epigastric pain: Secondary | ICD-10-CM | POA: Diagnosis not present

## 2017-03-26 DIAGNOSIS — Z96652 Presence of left artificial knee joint: Secondary | ICD-10-CM | POA: Diagnosis not present

## 2017-03-26 LAB — COMPREHENSIVE METABOLIC PANEL
ALT: 18 U/L (ref 14–54)
AST: 19 U/L (ref 15–41)
Albumin: 4.9 g/dL (ref 3.5–5.0)
Alkaline Phosphatase: 59 U/L (ref 38–126)
Anion gap: 10 (ref 5–15)
BUN: 8 mg/dL (ref 6–20)
CO2: 21 mmol/L — ABNORMAL LOW (ref 22–32)
Calcium: 10.6 mg/dL — ABNORMAL HIGH (ref 8.9–10.3)
Chloride: 103 mmol/L (ref 101–111)
Creatinine, Ser: 0.74 mg/dL (ref 0.44–1.00)
GFR calc Af Amer: >60 mL/min (ref >60)
GFR calc non Af Amer: >60 mL/min (ref >60)
Glucose, Bld: 102 mg/dL — ABNORMAL HIGH (ref 65–99)
Potassium: 4 mmol/L (ref 3.5–5.1)
Sodium: 134 mmol/L — ABNORMAL LOW (ref 135–145)
Total Bilirubin: 1.3 mg/dL — ABNORMAL HIGH (ref 0.3–1.2)
Total Protein: 8.8 g/dL — ABNORMAL HIGH (ref 6.5–8.1)

## 2017-03-26 LAB — URINALYSIS, ROUTINE W REFLEX MICROSCOPIC
Bacteria, UA: NONE SEEN
Bilirubin Urine: NEGATIVE
Glucose, UA: NEGATIVE mg/dL
Ketones, ur: 5 mg/dL — AB
Leukocytes, UA: NEGATIVE
Nitrite: NEGATIVE
Protein, ur: 30 mg/dL — AB
Specific Gravity, Urine: 1.014 (ref 1.005–1.030)
pH: 5 (ref 5.0–8.0)

## 2017-03-26 LAB — CBC
HCT: 44.8 % (ref 36.0–46.0)
Hemoglobin: 15.6 g/dL — ABNORMAL HIGH (ref 12.0–15.0)
MCH: 31.8 pg (ref 26.0–34.0)
MCHC: 34.8 g/dL (ref 30.0–36.0)
MCV: 91.4 fL (ref 78.0–100.0)
Platelets: 220 10*3/uL (ref 150–400)
RBC: 4.9 MIL/uL (ref 3.87–5.11)
RDW: 14.1 % (ref 11.5–15.5)
WBC: 7.9 10*3/uL (ref 4.0–10.5)

## 2017-03-26 LAB — LIPASE, BLOOD: Lipase: 46 U/L (ref 11–51)

## 2017-03-26 MED ORDER — DIPHENHYDRAMINE HCL 25 MG PO CAPS
25.0000 mg | ORAL_CAPSULE | Freq: Once | ORAL | Status: DC
Start: 2017-03-26 — End: 2017-03-26

## 2017-03-26 MED ORDER — PROCHLORPERAZINE 25 MG RE SUPP: 25.0000 mg | Freq: Two times a day (BID) | RECTAL | 0 refills | Status: DC | PRN

## 2017-03-26 MED ORDER — FAMOTIDINE 20 MG PO TABS
20.0000 mg | ORAL_TABLET | Freq: Once | ORAL | Status: DC
Start: 2017-03-26 — End: 2017-03-26

## 2017-03-26 MED ORDER — METHYLPREDNISOLONE SODIUM SUCC 125 MG IJ SOLR: 125.0000 mg | Freq: Once | INTRAMUSCULAR | Status: DC

## 2017-03-26 MED ORDER — KETOROLAC TROMETHAMINE 30 MG/ML IJ SOLN
15.0000 mg | Freq: Once | INTRAMUSCULAR | Status: DC
  Filled 2017-03-26: qty 1

## 2017-03-26 MED ORDER — HYDROCODONE-ACETAMINOPHEN 5-325 MG PO TABS
1.0000 | ORAL_TABLET | Freq: Once | ORAL | Status: AC
  Administered 2017-03-26: 1 via ORAL
  Filled 2017-03-26: qty 1

## 2017-03-26 MED ORDER — HYDROMORPHONE HCL 1 MG/ML IJ SOLN
1.0000 mg | Freq: Once | INTRAMUSCULAR | Status: AC
  Administered 2017-03-26: 1 mg via INTRAVENOUS
  Filled 2017-03-26: qty 1

## 2017-03-26 MED ORDER — SODIUM CHLORIDE 0.9 % IV BOLUS (SEPSIS)
2000.0000 mL | Freq: Once | INTRAVENOUS | Status: AC
  Administered 2017-03-26: 1000 mL via INTRAVENOUS

## 2017-03-26 MED ORDER — ONDANSETRON HCL 4 MG/2ML IJ SOLN
4.0000 mg | Freq: Once | INTRAMUSCULAR | Status: AC
  Administered 2017-03-26: 4 mg via INTRAVENOUS
  Filled 2017-03-26: qty 2

## 2017-03-26 MED ORDER — HYDROCODONE-ACETAMINOPHEN 5-325 MG PO TABS: 1.0000 | ORAL_TABLET | Freq: Four times a day (QID) | ORAL | 0 refills | Status: DC | PRN

## 2017-03-26 MED ORDER — PROMETHAZINE HCL 25 MG/ML IJ SOLN
25.0000 mg | Freq: Once | INTRAMUSCULAR | Status: AC
  Administered 2017-03-26: 25 mg via INTRAVENOUS
  Filled 2017-03-26: qty 1

## 2017-03-26 MED ORDER — METOCLOPRAMIDE HCL 5 MG/ML IJ SOLN
10.0000 mg | Freq: Once | INTRAMUSCULAR | Status: AC
  Administered 2017-03-26: 10 mg via INTRAVENOUS
  Filled 2017-03-26: qty 2

## 2017-03-26 NOTE — ED Notes (Signed)
Pt did not take BP meds this AM

## 2017-03-26 NOTE — ED Provider Notes (Signed)
[facility] DEPT Provider Note   CSN: [id number] Arrival date & time: 03/26/17  6720     History   Chief Complaint Chief Complaint  Patient presents with  . Abdominal Pain    HPI Colleen Lee is a 61 y.o. female.  Patient complains of abdominal pain and vomiting. She has chronic problems with her abdomen and is followed by GI for similar symptoms   The history is provided by the patient.  Abdominal Pain   This is a recurrent problem. The current episode started 12 to 24 hours ago. The problem occurs constantly. The problem has not changed since onset.The pain is associated with an unknown factor. The pain is located in the epigastric region. The quality of the pain is aching. The pain is at a severity of 6/10. The pain is moderate. Associated symptoms include vomiting. Pertinent negatives include anorexia, diarrhea, frequency, hematuria and headaches.    Past Medical History:  Diagnosis Date  . Asthma   . Chronic back pain   . Cirrhosis ([place])    Metavir score F4 on elastography 2015  . Fibroids   . Fibromyalgia   . GERD (gastroesophageal reflux disease)   . H. pylori infection 2014   treated with pylera, had to be treated again as it was not eradicated. Urea breath test then negative after subsequent treatment.   . [name]    HCV RNA positive 09/2012  . Hypertension   . PONV (postoperative nausea and vomiting)    pt thinks maybe once she had N&V  . Sciatica of left side     Patient Active Problem List   Diagnosis Date Noted  . Gastroenteritis 06/14/2016  . Nausea with vomiting 06/10/2016  . Uterine enlargement 06/09/2016  . Intractable nausea and vomiting 06/08/2016  . Acute infective gastroenteritis 06/08/2016  . Diarrhea 06/08/2016  . Essential hypertension 06/08/2016  . GERD (gastroesophageal reflux disease) 06/08/2016  . Abdominal pain   . Endometrial polyp 04/15/2016  . PMB (postmenopausal bleeding) 02/27/2016  . Alcoholic cirrhosis of liver  without ascites ([place])   . Asthma 01/21/2016  . Hepatic cirrhosis ([place]) 09/22/2015  . Arthritis of knee, degenerative 02/05/2015  . History of Helicobacter pylori infection 10/30/2014  . Chronic [name] with cirrhosis ([place]) 04/11/2014  . De Quervain's disease (radial styloid tenosynovitis) 12/25/2013  . Anorexia 11/21/2012  . FH: colon cancer 11/21/2012  . Early satiety 10/25/2012  . Bowel habit changes 10/25/2012  . Abdominal pain, epigastric 10/25/2012  . Abdominal bloating 10/25/2012  . Constipation 10/25/2012  . Abnormal weight loss 10/25/2012  . Chronic viral [name] ([place]) 10/25/2012  . Radicular pain of left lower extremity 09/28/2012  . Back pain 09/28/2012  . Sciatica 08/10/2011  . S/P arthroscopy of left knee 08/10/2011  . Tibial plateau fracture 08/10/2011  . Pain in joint, lower leg 02/12/2011  . Stiffness of joint, not elsewhere classified, lower leg 02/12/2011  . 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 Meniscus, medial, derangement 12/29/2010  . CLOSED FRACTURE OF UPPER END OF TIBIA 08/12/2010    Past Surgical History:  Procedure Laterality Date  . COLONOSCOPY  01/18/2008   NOB:SJGGEZ rectum.  Long redundant colon, a diminutive sigmoid polyp status post cold biopsy removed. Hyperplastic polyp. Repeat colonoscopy June 2014 due to family history of colon cancer  . COLONOSCOPY WITH ESOPHAGOGASTRODUODENOSCOPY (EGD) N/A 11/02/2012   MOQ:HUTMLYYT gastric mucosa of doubtful, +H.pylori. Incomplete colonoscopy due to patient unable to tolerate exam, proximal colon seen. Patient refused ACBE.  Marland Kitchen COLONOSCOPY WITH PROPOFOL  N/A 03/08/2013   Dr. Gala Romney: colonic polyp-removed as scribed above. Internal Hemorrhoids. Pathology did not reveal any colonic tissue, only mucus. SURVEILLANCE DUE Aug 2019  . ESOPHAGOGASTRODUODENOSCOPY  01/18/2008   RMR: Normal esophagus, normal  stomach  . ESOPHAGOGASTRODUODENOSCOPY (EGD) WITH PROPOFOL N/A 02/09/2016   Normal esophagus, small hiatal  hernia, portal hypertensive gastropathy, normal second portion of duodenum. Due in July 2019  . HYSTEROSCOPY  05/11/2016   Procedure: HYSTEROSCOPY;  Surgeon: Jonnie Kind, MD;  Location: [facility];  Service: Gynecology;;  . KNEE SURGERY     left knee  . MULTIPLE EXTRACTIONS WITH ALVEOLOPLASTY N/A 10/15/2013   Procedure: MULTIPLE EXTRACTION WITH ALVEOLOPLASTY;  Surgeon: Gae Bon, DDS;  Location: [facility];  Service: Oral Surgery;  Laterality: N/A;  . POLYPECTOMY N/A 03/08/2013   Procedure: POLYPECTOMY;  Surgeon: Daneil Dolin, MD;  Location: [facility];  Service: Endoscopy;  Laterality: N/A;  . POLYPECTOMY N/A 05/11/2016   Procedure: REMOVAL OF ENDOMETRIAL POLYP;  Surgeon: Jonnie Kind, MD;  Location: [facility];  Service: Gynecology;  Laterality: N/A;  . TOTAL KNEE ARTHROPLASTY Left 02/05/2015   Procedure: LEFT TOTAL KNEE ARTHROPLASTY;  Surgeon: Carole Civil, MD;  Location: [facility];  Service: Orthopedics;  Laterality: Left;    OB History    Gravida Para Term Preterm AB Living             1   SAB TAB Ectopic Multiple Live Births                   Home Medications    Prior to Admission medications   Medication Sig Start Date End Date Taking? Authorizing Provider  aspirin 325 MG EC tablet Take 1 tablet (325 mg total) by mouth daily. 06/13/16   Sinda Du, MD  budesonide-formoterol [facility]) 160-4.5 MCG/ACT inhaler Inhale 2 puffs into the lungs 2 (two) times daily.    [provider]  Cyanocobalamin (VITAMIN B 12 PO) Take 1 tablet by mouth daily.    [provider]  cyclobenzaprine (FLEXERIL) 10 MG tablet Take 1 tablet by mouth daily as needed for muscle spasms. 09/09/16   [provider]  HYDROcodone-acetaminophen (NORCO/VICODIN) 5-325 MG tablet Take 1 tablet by mouth every 6 (six) hours as needed for moderate pain. 03/26/17   Milton Ferguson, MD  linaclotide Rolan Lipa) 72 MCG capsule Take 1 capsule (72 mcg total) by mouth daily before breakfast. 12/13/16    Carlis Stable, NP  lisinopril (PRINIVIL,ZESTRIL) 20 MG tablet Take 1 tablet by mouth daily. 09/25/15   [provider]  megestrol (MEGACE) 40 MG tablet Take 40 mg by mouth daily.    [provider]  metoCLOPramide (REGLAN) 10 MG tablet Take 1 tablet (10 mg total) by mouth every 6 (six) hours as needed for nausea (nausea/headache). 12/17/16   Elnora Morrison, MD  ondansetron (ZOFRAN ODT) 4 MG disintegrating tablet '4mg'$  ODT q4 hours prn nausea/vomit 12/17/16   Elnora Morrison, MD  ondansetron (ZOFRAN ODT) 4 MG disintegrating tablet Take 1 tablet (4 mg total) by mouth every 8 (eight) hours as needed. 12/18/16   Fredia Sorrow, MD  pantoprazole (PROTONIX) 40 MG tablet Take 1 tablet (40 mg total) by mouth daily. 10/19/16   Rourk, Cristopher Estimable, MD  prochlorperazine (COMPAZINE) 25 MG suppository Place 1 suppository (25 mg total) rectally every 12 (twelve) hours as needed for nausea or vomiting. 03/26/17   Milton Ferguson, MD  promethazine (PHENERGAN) 25 MG tablet Take 1 tablet (25 mg total) by mouth  every 6 (six) hours as needed for nausea or vomiting. 10/04/16   [name], MD    Family History Family History  Problem Relation Age of Onset  . Colon cancer Brother 53          . Multiple myeloma Brother   . Liver cancer Sister   . Prostate cancer Brother   . Pancreatic cancer Brother   . Cancer Mother        breast  . Asthma Mother     Social History Social History  Substance Use Topics  . Smoking status: Current Every Day Smoker    Packs/day: 0.50    Years: 40.00    Types: Cigarettes  . Smokeless tobacco: Never Used     Comment: Smokes one pack of cigarettes daily  . Alcohol use No     Allergies   Penicillins   Review of Systems Review of Systems  Constitutional: Negative for appetite change and fatigue.  HENT: Negative for congestion, ear discharge and sinus pressure.   Eyes: Negative for discharge.  Respiratory: Negative for cough.   Cardiovascular: Negative for  chest pain.  Gastrointestinal: Positive for abdominal pain and vomiting. Negative for anorexia and diarrhea.  Genitourinary: Negative for frequency and hematuria.  Musculoskeletal: Negative for back pain.  Skin: Negative for rash.  Neurological: Negative for seizures and headaches.  Psychiatric/Behavioral: Negative for hallucinations.     Physical Exam Updated Vital Signs BP (S) (!) 177/99   Pulse 69   Temp 98 F (36.7 C) (Oral)   Resp 18   Ht '5\' 6"'$  (1.676 m)   Wt 85.3 kg (188 lb)   SpO2 93%   BMI 30.34 kg/m   Physical Exam  Constitutional: She is oriented to person, place, and time. She appears well-developed.  HENT:  Head: Normocephalic.  Eyes: Conjunctivae and EOM are normal. No scleral icterus.  Neck: Neck supple. No thyromegaly present.  Cardiovascular: Normal rate and regular rhythm.  Exam reveals no gallop and no friction rub.   No murmur heard. Pulmonary/Chest: No stridor. She has no wheezes. She has no rales. She exhibits no tenderness.  Abdominal: She exhibits no distension. There is tenderness. There is no rebound.  Musculoskeletal: Normal range of motion. She exhibits no edema.  Lymphadenopathy:    She has no cervical adenopathy.  Neurological: She is oriented to person, place, and time. She exhibits normal muscle tone. Coordination normal.  Skin: No rash noted. No erythema.  Psychiatric: She has a normal mood and affect. Her behavior is normal.     ED Treatments / Results  Labs (all labs ordered are listed, but only abnormal results are displayed) Labs Reviewed  COMPREHENSIVE METABOLIC PANEL - Abnormal; Notable for the following:       Result Value   Sodium 134 (*)    CO2 21 (*)    Glucose, Bld 102 (*)    Calcium 10.6 (*)    Total Protein 8.8 (*)    Total Bilirubin 1.3 (*)    All other components within normal limits  CBC - Abnormal; Notable for the following:    Hemoglobin 15.6 (*)    All other components within normal limits  URINALYSIS,  ROUTINE W REFLEX MICROSCOPIC - Abnormal; Notable for the following:    Hgb urine dipstick SMALL (*)    Ketones, ur 5 (*)    Protein, ur 30 (*)    Squamous Epithelial / LPF 0-5 (*)    All other components within normal limits  LIPASE, BLOOD  EKG  EKG Interpretation None       Radiology Dg Abd Acute W/chest  Result Date: 03/26/2017 CLINICAL DATA:  Abdominal pain, nausea and vomiting, and diarrhea for several days. Cirrhosis. EXAM: DG ABDOMEN ACUTE W/ 1V CHEST COMPARISON:  12/17/2016 FINDINGS: There is no evidence of dilated bowel loops or free intraperitoneal air. Paucity of bowel gas noted. Small calcified fibroid seen in the right pelvis. Vascular calcification also noted. Heart size and mediastinal contours are within normal limits. Stable mild elevation of left hemidiaphragm. Both lungs are clear. IMPRESSION: Unremarkable bowel gas pattern. Small calcified uterine fibroid again noted. No active cardiopulmonary disease. Electronically Signed   By: Earle Gell M.D.   On: 03/26/2017 11:19    Procedures Procedures (including critical care time)  Medications Ordered in ED Medications  ketorolac (TORADOL) 30 MG/ML injection 15 mg (15 mg Intravenous Refused 03/26/17 0944)  sodium chloride 0.9 % bolus 2,000 mL (0 mLs Intravenous Stopped 03/26/17 1245)  ondansetron (ZOFRAN) injection 4 mg (4 mg Intravenous Given 03/26/17 0939)  HYDROcodone-acetaminophen (NORCO/VICODIN) 5-325 MG per tablet 1 tablet (1 tablet Oral Given 03/26/17 0954)  promethazine (PHENERGAN) injection 25 mg (25 mg Intravenous Given 03/26/17 1016)  metoCLOPramide (REGLAN) injection 10 mg (10 mg Intravenous Given 03/26/17 1204)  HYDROmorphone (DILAUDID) injection 1 mg (1 mg Intravenous Given 03/26/17 1204)     Initial Impression / Assessment and Plan / ED Course  I have reviewed the triage vital signs and the nursing notes.  Pertinent labs & imaging results that were available during my care of the patient were reviewed by  me and considered in my medical decision making (see chart for details).     Patient with abdominal pain and vomiting X-rays and labs unremarkable. Patient improved with treatment of nausea pain and given fluids in the emergency department. Suspect this is her chronic problem. She is given some Compazine and Vicodin and will follow-up with PCP or GI doctor  Final Clinical Impressions(s) / ED Diagnoses   Final diagnoses:  Pain of upper abdomen    New Prescriptions New Prescriptions   HYDROCODONE-ACETAMINOPHEN (NORCO/VICODIN) 5-325 MG TABLET    Take 1 tablet by mouth every 6 (six) hours as needed for moderate pain.   PROCHLORPERAZINE (COMPAZINE) 25 MG SUPPOSITORY    Place 1 suppository (25 mg total) rectally every 12 (twelve) hours as needed for nausea or vomiting.     Milton Ferguson, MD 03/26/17 1302

## 2017-03-26 NOTE — ED Notes (Signed)
Pt in x-ray at this time

## 2017-03-26 NOTE — ED Notes (Signed)
Pt refused Toradol.  States she must have Dilaudid and the doctor knows this.  States she always gets 2 doses of Dilaudid before her pain gets any better.  Pt states if she is not getting Dilaudid she may as well have stayed at home.  Advised pt I would let the provider know, but Dilaudid was not the first line treatment for chronic abd pain especially given her hx of constipation.

## 2017-03-26 NOTE — Discharge Instructions (Signed)
Follow up with your family md or gi md this week.

## 2017-03-26 NOTE — ED Notes (Signed)
Pt requesting to see the doctor at this time.  States she is not giving a urine until she gets more pain medication.

## 2017-03-26 NOTE — ED Triage Notes (Signed)
Pt reports history of same. Pt reports abd pain, n/v/d for last several days.

## 2017-03-26 NOTE — ED Notes (Signed)
Advised pt I spoke with the physician and he would order Hydrocodone x1 in place of Toradol.  Pt very upset about this and states her dog takes stronger pain medication than that.  Advised pt regarding the dangers of IV narcotic use and pt states she is no longer coming to this hospital.

## 2017-03-28 NOTE — Progress Notes (Signed)
MELD 8. US abdomen with cirrhosis, due again in 6 months. Abdominal xray with large amount of stool as suspected. Take Amitiza as directed.

## 2017-03-31 ENCOUNTER — Emergency Department (HOSPITAL_COMMUNITY)
Admission: EM | Admit: 2017-03-31 | Discharge: 2017-03-31 | Disposition: A | Discharge status: Home / Self Care | Payer: Medicare HMO | Attending: Emergency Medicine | Admitting: Emergency Medicine

## 2017-03-31 ENCOUNTER — Encounter (HOSPITAL_COMMUNITY): Payer: Self-pay | Admitting: Emergency Medicine

## 2017-03-31 DIAGNOSIS — R1084 Generalized abdominal pain: Secondary | ICD-10-CM

## 2017-03-31 DIAGNOSIS — F17210 Nicotine dependence, cigarettes, uncomplicated: Secondary | ICD-10-CM | POA: Insufficient documentation

## 2017-03-31 DIAGNOSIS — R197 Diarrhea, unspecified: Secondary | ICD-10-CM | POA: Diagnosis not present

## 2017-03-31 DIAGNOSIS — Z7982 Long term (current) use of aspirin: Secondary | ICD-10-CM | POA: Diagnosis not present

## 2017-03-31 DIAGNOSIS — I10 Essential (primary) hypertension: Secondary | ICD-10-CM | POA: Diagnosis not present

## 2017-03-31 DIAGNOSIS — J45909 Unspecified asthma, uncomplicated: Secondary | ICD-10-CM | POA: Diagnosis not present

## 2017-03-31 DIAGNOSIS — R112 Nausea with vomiting, unspecified: Secondary | ICD-10-CM | POA: Insufficient documentation

## 2017-03-31 DIAGNOSIS — Z96652 Presence of left artificial knee joint: Secondary | ICD-10-CM | POA: Diagnosis not present

## 2017-03-31 LAB — I-STAT CG4 LACTIC ACID, ED: Lactic Acid, Venous: 1.5 mmol/L (ref 0.5–1.9)

## 2017-03-31 LAB — COMPREHENSIVE METABOLIC PANEL
ALT: 20 U/L (ref 14–54)
AST: 23 U/L (ref 15–41)
Albumin: 4.9 g/dL (ref 3.5–5.0)
Alkaline Phosphatase: 53 U/L (ref 38–126)
Anion gap: 14 (ref 5–15)
BUN: 35 mg/dL — ABNORMAL HIGH (ref 6–20)
CO2: 18 mmol/L — ABNORMAL LOW (ref 22–32)
Calcium: 10.3 mg/dL (ref 8.9–10.3)
Chloride: 97 mmol/L — ABNORMAL LOW (ref 101–111)
Creatinine, Ser: 1.62 mg/dL — ABNORMAL HIGH (ref 0.44–1.00)
GFR calc Af Amer: 39 mL/min — ABNORMAL LOW (ref >60)
GFR calc non Af Amer: 33 mL/min — ABNORMAL LOW (ref >60)
Glucose, Bld: 119 mg/dL — ABNORMAL HIGH (ref 65–99)
Potassium: 3.2 mmol/L — ABNORMAL LOW (ref 3.5–5.1)
Sodium: 129 mmol/L — ABNORMAL LOW (ref 135–145)
Total Bilirubin: 1.8 mg/dL — ABNORMAL HIGH (ref 0.3–1.2)
Total Protein: 9 g/dL — ABNORMAL HIGH (ref 6.5–8.1)

## 2017-03-31 LAB — URINALYSIS, ROUTINE W REFLEX MICROSCOPIC
Bilirubin Urine: NEGATIVE
Glucose, UA: NEGATIVE mg/dL
Hgb urine dipstick: NEGATIVE
Ketones, ur: NEGATIVE mg/dL
Leukocytes, UA: NEGATIVE
Nitrite: NEGATIVE
Protein, ur: 30 mg/dL — AB
Specific Gravity, Urine: 1.014 (ref 1.005–1.030)
pH: 6 (ref 5.0–8.0)

## 2017-03-31 LAB — I-STAT TROPONIN, ED: Troponin i, poc: 0.03 ng/mL (ref 0.00–0.08)

## 2017-03-31 LAB — CBC
HCT: 45.9 % (ref 36.0–46.0)
Hemoglobin: 16.6 g/dL — ABNORMAL HIGH (ref 12.0–15.0)
MCH: 32 pg (ref 26.0–34.0)
MCHC: 36.2 g/dL — ABNORMAL HIGH (ref 30.0–36.0)
MCV: 88.4 fL (ref 78.0–100.0)
Platelets: 219 10*3/uL (ref 150–400)
RBC: 5.19 MIL/uL — ABNORMAL HIGH (ref 3.87–5.11)
RDW: 13.5 % (ref 11.5–15.5)
WBC: 11.1 10*3/uL — ABNORMAL HIGH (ref 4.0–10.5)

## 2017-03-31 LAB — LIPASE, BLOOD: Lipase: 73 U/L — ABNORMAL HIGH (ref 11–51)

## 2017-03-31 MED ORDER — SODIUM CHLORIDE 0.9 % IV BOLUS (SEPSIS)
1000.0000 mL | Freq: Once | INTRAVENOUS | Status: AC
  Administered 2017-03-31: 1000 mL via INTRAVENOUS

## 2017-03-31 MED ORDER — MORPHINE SULFATE (PF) 4 MG/ML IV SOLN
4.0000 mg | Freq: Once | INTRAVENOUS | Status: AC
  Administered 2017-03-31: 4 mg via INTRAVENOUS
  Filled 2017-03-31: qty 1

## 2017-03-31 MED ORDER — PROMETHAZINE HCL 25 MG PO TABS: 25.0000 mg | ORAL_TABLET | Freq: Four times a day (QID) | ORAL | 0 refills | Status: DC | PRN

## 2017-03-31 MED ORDER — DICYCLOMINE HCL 20 MG PO TABS: 20.0000 mg | ORAL_TABLET | Freq: Two times a day (BID) | ORAL | 0 refills | Status: DC

## 2017-03-31 MED ORDER — SODIUM CHLORIDE 0.9 % IV SOLN
8.0000 mg | Freq: Once | INTRAVENOUS | Status: AC
  Administered 2017-03-31: 8 mg via INTRAVENOUS
  Filled 2017-03-31: qty 4

## 2017-03-31 MED ORDER — GLYCOPYRROLATE 0.2 MG/ML IJ SOLN
0.2000 mg | Freq: Once | INTRAMUSCULAR | Status: AC
  Administered 2017-03-31: 0.2 mg via INTRAVENOUS
  Filled 2017-03-31: qty 1

## 2017-03-31 MED ORDER — PROMETHAZINE HCL 25 MG/ML IJ SOLN
12.5000 mg | Freq: Once | INTRAMUSCULAR | Status: AC
  Administered 2017-03-31: 12.5 mg via INTRAVENOUS
  Filled 2017-03-31: qty 1

## 2017-03-31 MED ORDER — DIPHENHYDRAMINE HCL 50 MG/ML IJ SOLN
25.0000 mg | Freq: Once | INTRAMUSCULAR | Status: AC
  Administered 2017-03-31: 25 mg via INTRAVENOUS
  Filled 2017-03-31: qty 1

## 2017-03-31 NOTE — ED Provider Notes (Signed)
[facility] DEPT Provider Note   CSN: [id number] Arrival date & time: 03/31/17  1046     History   Chief Complaint Chief Complaint  Patient presents with  . Abdominal Pain  . Diarrhea    HPI Colleen Lee is a 61 y.o. female.chief complaint is abdominal pain, nausea vomiting, diarrhea.  HPI:  Colleen Lee is a 61 year old black female. She has a history of asthma, chronic back pain, cirrhosis secondary to [name], hypertension, chronic abdominal pain, previous H. Pylori status post treatment 2, fibromyalgia, frequent nausea vomiting, possible hyperemesis, known abdominal vascular disease via CTA.  She reports intermittent episodes of pain over the last few years. She essentially states "no one ever tells me what is wrong".  No bloody stools. No fevers. Pain is not severe but intermittent crampy. Associated nausea and vomiting today.  Past Medical History:  Diagnosis Date  . Asthma   . Chronic back pain   . Cirrhosis ([place])    Metavir score F4 on elastography 2015  . Fibroids   . Fibromyalgia   . GERD (gastroesophageal reflux disease)   . H. pylori infection 2014   treated with pylera, had to be treated again as it was not eradicated. Urea breath test then negative after subsequent treatment.   . [name]    HCV RNA positive 09/2012  . Hypertension   . PONV (postoperative nausea and vomiting)    pt thinks maybe once she had N&V  . Sciatica of left side     Patient Active Problem List   Diagnosis Date Noted  . Gastroenteritis 06/14/2016  . Nausea with vomiting 06/10/2016  . Uterine enlargement 06/09/2016  . Intractable nausea and vomiting 06/08/2016  . Acute infective gastroenteritis 06/08/2016  . Diarrhea 06/08/2016  . Essential hypertension 06/08/2016  . GERD (gastroesophageal reflux disease) 06/08/2016  . Abdominal pain   . Endometrial polyp 04/15/2016  . PMB (postmenopausal bleeding) 02/27/2016  . Alcoholic cirrhosis of liver without ascites ([place])   .  Asthma 01/21/2016  . Hepatic cirrhosis ([place]) 09/22/2015  . Arthritis of knee, degenerative 02/05/2015  . History of Helicobacter pylori infection 10/30/2014  . Chronic [name] with cirrhosis ([place]) 04/11/2014  . De Quervain's disease (radial styloid tenosynovitis) 12/25/2013  . Anorexia 11/21/2012  . FH: colon cancer 11/21/2012  . Early satiety 10/25/2012  . Bowel habit changes 10/25/2012  . Abdominal pain, epigastric 10/25/2012  . Abdominal bloating 10/25/2012  . Constipation 10/25/2012  . Abnormal weight loss 10/25/2012  . Chronic viral [name] ([place]) 10/25/2012  . Radicular pain of left lower extremity 09/28/2012  . Back pain 09/28/2012  . Sciatica 08/10/2011  . S/P arthroscopy of left knee 08/10/2011  . Tibial plateau fracture 08/10/2011  . Pain in joint, lower leg 02/12/2011  . Stiffness of joint, not elsewhere classified, lower leg 02/12/2011  . 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 Meniscus, medial, derangement 12/29/2010  . CLOSED FRACTURE OF UPPER END OF TIBIA 08/12/2010    Past Surgical History:  Procedure Laterality Date  . COLONOSCOPY  01/18/2008   OQH:UTMLYY rectum.  Long redundant colon, a diminutive sigmoid polyp status post cold biopsy removed. Hyperplastic polyp. Repeat colonoscopy June 2014 due to family history of colon cancer  . COLONOSCOPY WITH ESOPHAGOGASTRODUODENOSCOPY (EGD) N/A 11/02/2012   TKP:TWSFKCLE gastric mucosa of doubtful, +H.pylori. Incomplete colonoscopy due to patient unable to tolerate exam, proximal colon seen. Patient refused ACBE.  Marland Kitchen COLONOSCOPY WITH PROPOFOL N/A 03/08/2013   Dr. Gala Romney: colonic polyp-removed as scribed above. Internal Hemorrhoids. Pathology  did not reveal any colonic tissue, only mucus. SURVEILLANCE DUE Aug 2019  . ESOPHAGOGASTRODUODENOSCOPY  01/18/2008   RMR: Normal esophagus, normal  stomach  . ESOPHAGOGASTRODUODENOSCOPY (EGD) WITH PROPOFOL N/A 02/09/2016   Normal esophagus, small hiatal hernia, portal hypertensive  gastropathy, normal second portion of duodenum. Due in July 2019  . HYSTEROSCOPY  05/11/2016   Procedure: HYSTEROSCOPY;  Surgeon: Jonnie Kind, MD;  Location: [facility];  Service: Gynecology;;  . KNEE SURGERY     left knee  . MULTIPLE EXTRACTIONS WITH ALVEOLOPLASTY N/A 10/15/2013   Procedure: MULTIPLE EXTRACTION WITH ALVEOLOPLASTY;  Surgeon: Gae Bon, DDS;  Location: [facility];  Service: Oral Surgery;  Laterality: N/A;  . POLYPECTOMY N/A 03/08/2013   Procedure: POLYPECTOMY;  Surgeon: Daneil Dolin, MD;  Location: [facility];  Service: Endoscopy;  Laterality: N/A;  . POLYPECTOMY N/A 05/11/2016   Procedure: REMOVAL OF ENDOMETRIAL POLYP;  Surgeon: Jonnie Kind, MD;  Location: [facility];  Service: Gynecology;  Laterality: N/A;  . TOTAL KNEE ARTHROPLASTY Left 02/05/2015   Procedure: LEFT TOTAL KNEE ARTHROPLASTY;  Surgeon: Carole Civil, MD;  Location: [facility];  Service: Orthopedics;  Laterality: Left;    OB History    Gravida Para Term Preterm AB Living             1   SAB TAB Ectopic Multiple Live Births                   Home Medications    Prior to Admission medications   Medication Sig Start Date End Date Taking? Authorizing Provider  aspirin 325 MG EC tablet Take 1 tablet (325 mg total) by mouth daily. 06/13/16  Yes Sinda Du, MD  budesonide-formoterol [facility]) 160-4.5 MCG/ACT inhaler Inhale 2 puffs into the lungs 2 (two) times daily.   Yes [provider]  Cyanocobalamin (VITAMIN B 12 PO) Take 1 tablet by mouth daily.   Yes [provider]  HYDROcodone-acetaminophen (NORCO/VICODIN) 5-325 MG tablet Take 1 tablet by mouth every 6 (six) hours as needed for moderate pain. 03/26/17  Yes Milton Ferguson, MD  linaclotide [facility]) 72 MCG capsule Take 1 capsule (72 mcg total) by mouth daily before breakfast. 12/13/16  Yes Carlis Stable, NP  lisinopril (PRINIVIL,ZESTRIL) 20 MG tablet Take 20 mg by mouth daily.  09/25/15  Yes [provider]  megestrol (MEGACE)  40 MG tablet Take 40 mg by mouth daily.   Yes [provider]  ondansetron (ZOFRAN ODT) 4 MG disintegrating tablet Take 1 tablet (4 mg total) by mouth every 8 (eight) hours as needed. 12/18/16  Yes Fredia Sorrow, MD  pantoprazole (PROTONIX) 40 MG tablet Take 1 tablet (40 mg total) by mouth daily. 10/19/16  Yes Rourk, Cristopher Estimable, MD  cyclobenzaprine (FLEXERIL) 10 MG tablet TAKE (1) TABLET BY MOUTH THREE TIMES DAILY. Patient not taking: Reported on 03/31/2017 03/28/17   Carole Civil, MD  dicyclomine (BENTYL) 20 MG tablet Take 1 tablet (20 mg total) by mouth 2 (two) times daily. 03/31/17   Tanna Furry, MD  metoCLOPramide (REGLAN) 10 MG tablet Take 1 tablet (10 mg total) by mouth every 6 (six) hours as needed for nausea (nausea/headache). Patient not taking: Reported on 03/31/2017 12/17/16   Elnora Morrison, MD  ondansetron (ZOFRAN ODT) 4 MG disintegrating tablet '4mg'$  ODT q4 hours prn nausea/vomit Patient not taking: Reported on 03/31/2017 12/17/16   Elnora Morrison, MD  prochlorperazine (COMPAZINE) 25 MG suppository Place 1 suppository (25 mg total) rectally every 12 (twelve)  hours as needed for nausea or vomiting. 03/26/17   Milton Ferguson, MD  promethazine (PHENERGAN) 25 MG tablet Take 1 tablet (25 mg total) by mouth every 6 (six) hours as needed for nausea or vomiting. 03/31/17   Tanna Furry, MD    Family History Family History  Problem Relation Age of Onset  . Colon cancer Brother 68          . Multiple myeloma Brother   . Liver cancer Sister   . Prostate cancer Brother   . Pancreatic cancer Brother   . Cancer Mother        breast  . Asthma Mother     Social History Social History  Substance Use Topics  . Smoking status: Current Every Day Smoker    Packs/day: 0.50    Years: 40.00    Types: Cigarettes  . Smokeless tobacco: Never Used     Comment: Smokes one pack of cigarettes daily  . Alcohol use No     Allergies   Penicillins   Review of Systems Review of Systems    Constitutional: Negative for appetite change, chills, diaphoresis, fatigue and fever.  HENT: Negative for mouth sores, sore throat and trouble swallowing.   Eyes: Negative for visual disturbance.  Respiratory: Negative for cough, chest tightness, shortness of breath and wheezing.   Cardiovascular: Negative for chest pain.  Gastrointestinal: Positive for abdominal pain, diarrhea, nausea and vomiting. Negative for abdominal distention.  Endocrine: Negative for polydipsia, polyphagia and polyuria.  Genitourinary: Negative for dysuria, frequency and hematuria.  Musculoskeletal: Negative for gait problem.  Skin: Negative for color change, pallor and rash.  Neurological: Negative for dizziness, syncope, light-headedness and headaches.  Hematological: Does not bruise/bleed easily.  Psychiatric/Behavioral: Negative for behavioral problems and confusion.     Physical Exam Updated Vital Signs BP (!) 154/109   Pulse 88   Temp 98.6 F (37 C) (Oral)   Resp 15   SpO2 100%   Physical Exam  Constitutional: She is oriented to person, place, and time. She appears well-developed and well-nourished. No distress.  HENT:  Head: Normocephalic.  Sclera anicteric. Conjunctiva not pale. Oropharynx mucous membranes are not dried her mother.  Eyes: Pupils are equal, round, and reactive to light. Conjunctivae are normal. No scleral icterus.  Neck: Normal range of motion. Neck supple. No thyromegaly present.  Cardiovascular: Normal rate and regular rhythm.  Exam reveals no gallop and no friction rub.   No murmur heard. Pulmonary/Chest: Effort normal and breath sounds normal. No respiratory distress. She has no wheezes. She has no rales.  Abdominal: Soft. Bowel sounds are normal. She exhibits no distension. There is no tenderness. There is no rebound.  Guaiac-negative on rectal exam. Soft benign abdomen. Normoactive bowel sounds. No tenderness.  Musculoskeletal: Normal range of motion.  Neurological: She is  alert and oriented to person, place, and time.  Skin: Skin is warm and dry. No rash noted.  Psychiatric: She has a normal mood and affect. Her behavior is normal.     ED Treatments / Results  Labs (all labs ordered are listed, but only abnormal results are displayed) Labs Reviewed  LIPASE, BLOOD - Abnormal; Notable for the following:       Result Value   Lipase 73 (*)    All other components within normal limits  COMPREHENSIVE METABOLIC PANEL - Abnormal; Notable for the following:    Sodium 129 (*)    Potassium 3.2 (*)    Chloride 97 (*)    CO2 18 (*)  Glucose, Bld 119 (*)    BUN 35 (*)    Creatinine, Ser 1.62 (*)    Total Protein 9.0 (*)    Total Bilirubin 1.8 (*)    GFR calc non Af Amer 33 (*)    GFR calc Af Amer 39 (*)    All other components within normal limits  CBC - Abnormal; Notable for the following:    WBC 11.1 (*)    RBC 5.19 (*)    Hemoglobin 16.6 (*)    MCHC 36.2 (*)    All other components within normal limits  URINALYSIS, ROUTINE W REFLEX MICROSCOPIC - Abnormal; Notable for the following:    Protein, ur 30 (*)    Bacteria, UA RARE (*)    Squamous Epithelial / LPF 0-5 (*)    All other components within normal limits  I-STAT CG4 LACTIC ACID, ED  I-STAT TROPONIN, ED  I-STAT CG4 LACTIC ACID, ED    EKG  EKG Interpretation None       Radiology No results found.  Procedures Procedures (including critical care time)  Medications Ordered in ED Medications  promethazine (PHENERGAN) injection 12.5 mg (12.5 mg Intravenous Given 03/31/17 1655)  morphine 4 MG/ML injection 4 mg (4 mg Intravenous Given 03/31/17 1656)  glycopyrrolate (ROBINUL) injection 0.2 mg (0.2 mg Intravenous Given 03/31/17 1656)  sodium chloride 0.9 % bolus 1,000 mL (0 mLs Intravenous Stopped 03/31/17 1757)    Followed by  sodium chloride 0.9 % bolus 1,000 mL (0 mLs Intravenous Stopped 03/31/17 1757)  diphenhydrAMINE (BENADRYL) injection 25 mg (25 mg Intravenous Given 03/31/17 1815)    ondansetron (ZOFRAN) 8 mg in sodium chloride 0.9 % 50 mL IVPB (8 mg Intravenous New Bag/Given 03/31/17 1815)  morphine 4 MG/ML injection 4 mg (4 mg Intravenous Given 03/31/17 1816)     Initial Impression / Assessment and Plan / ED Course  I have reviewed the triage vital signs and the nursing notes.  Pertinent labs & imaging results that were available during my care of the patient were reviewed by me and considered in my medical decision making (see chart for details).   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  IMA origin is occluded but branches reconstitute.  Minimal elevation of lipase. That is 11.1. Normal lactate. Bicarbonate 18. Mild renal insufficiency likely prerenal at 1.6. Given 2 L of fluid. Given morphine for given Tylenol. Given Zofran and Phenergan. She is taking by mouth. Discuss her findings with her. Recommended Rest. Clear liquids. Primary care follow-up. ER with worsening. I do not think this represents an episode of acute ischemic bowel. His maybe chronic abdominal pain and may be secondary to some vascular insufficiency based on her CTA. But no blood in stool, no lactic acidosis. No pain out of proportion. emodynamically stable. Improving with reatment.. Tolerating by mouth.   Final Clinical Impressions(s) / ED Diagnoses   Final diagnoses:  Generalized abdominal pain  Nausea vomiting and diarrhea    New Prescriptions New Prescriptions   DICYCLOMINE (BENTYL) 20 MG TABLET    Take 1 tablet (20 mg total) by mouth 2 (two) times daily.   PROMETHAZINE (PHENERGAN) 25 MG TABLET    Take 1 tablet (25 mg total) by mouth every 6 (six) hours as needed for nausea or vomiting.     Tanna Furry, MD 03/31/17 [phone]

## 2017-03-31 NOTE — Progress Notes (Signed)
ON RECALL  °

## 2017-03-31 NOTE — Discharge Instructions (Signed)
Your intermittent abdominal pain may be caused by poor blood supply to your intestine. Your blood test today do not indicate severe abnormalities. Rest your gut. No solid food, liquids only for 24 hours before slowly advancing. Bentyl for pain or cramps. Phenergan for nausea.

## 2017-03-31 NOTE — ED Triage Notes (Signed)
Patient c/o abd pain and diarrhea that started last week.  Patient states that she went to [facility] and they couldn't find anything wrong with patient but doesn't understand why still having symptoms.

## 2017-04-06 DIAGNOSIS — F17200 Nicotine dependence, unspecified, uncomplicated: Secondary | ICD-10-CM | POA: Diagnosis not present

## 2017-04-06 DIAGNOSIS — R109 Unspecified abdominal pain: Secondary | ICD-10-CM | POA: Diagnosis not present

## 2017-04-13 DIAGNOSIS — I10 Essential (primary) hypertension: Secondary | ICD-10-CM | POA: Diagnosis not present

## 2017-04-13 DIAGNOSIS — F17210 Nicotine dependence, cigarettes, uncomplicated: Secondary | ICD-10-CM | POA: Diagnosis not present

## 2017-04-13 DIAGNOSIS — J449 Chronic obstructive pulmonary disease, unspecified: Secondary | ICD-10-CM | POA: Diagnosis not present

## 2017-04-13 DIAGNOSIS — B182 Chronic viral hepatitis C: Secondary | ICD-10-CM | POA: Diagnosis not present

## 2017-04-15 ENCOUNTER — Ambulatory Visit: Payer: Medicare HMO | Admitting: Orthopedic Surgery

## 2017-04-19 ENCOUNTER — Encounter: Payer: Self-pay | Admitting: Orthopedic Surgery

## 2017-04-19 ENCOUNTER — Ambulatory Visit (INDEPENDENT_AMBULATORY_CARE_PROVIDER_SITE_OTHER): Payer: Medicare HMO | Admitting: Orthopedic Surgery

## 2017-04-19 VITALS — BP 172/101 | HR 87 | Ht 66.0 in | Wt 188.0 lb

## 2017-04-19 DIAGNOSIS — M1732 Unilateral post-traumatic osteoarthritis, left knee: Secondary | ICD-10-CM

## 2017-04-19 DIAGNOSIS — M25562 Pain in left knee: Secondary | ICD-10-CM

## 2017-04-19 DIAGNOSIS — G894 Chronic pain syndrome: Secondary | ICD-10-CM | POA: Diagnosis not present

## 2017-04-19 DIAGNOSIS — S82142S Displaced bicondylar fracture of left tibia, sequela: Secondary | ICD-10-CM | POA: Diagnosis not present

## 2017-04-19 DIAGNOSIS — M5410 Radiculopathy, site unspecified: Secondary | ICD-10-CM

## 2017-04-19 DIAGNOSIS — S82142P Displaced bicondylar fracture of left tibia, subsequent encounter for closed fracture with malunion: Secondary | ICD-10-CM

## 2017-04-19 NOTE — Progress Notes (Signed)
Chief Complaint  Patient presents with  . Follow-up    Recheck on left leg pain.   61 YO FEMALE H/O LEFT KNEE INJURY  Last week she had bilateral leg edema and swelling in the left knee this responded well to diuretics. Comes in today with dull aching anterior medial anterolateral left knee pain X 2 WEEKS  IT IS CONSTANT      Review of Systems  Gastrointestinal: Negative.   Genitourinary: Negative.   Musculoskeletal: Positive for back pain.  Neurological: Positive for tingling and sensory change.   Past Medical History:  Diagnosis Date  . Asthma   . Chronic back pain   . Cirrhosis ([place])    Metavir score F4 on elastography 2015  . Fibroids   . Fibromyalgia   . GERD (gastroesophageal reflux disease)   . H. pylori infection 2014   treated with pylera, had to be treated again as it was not eradicated. Urea breath test then negative after subsequent treatment.   . [name]    HCV RNA positive 09/2012  . Hypertension   . PONV (postoperative nausea and vomiting)    pt thinks maybe once she had N&V  . Sciatica of left side     BP (!) 172/101   Pulse 87   Ht 5\' 6"  (1.676 m)   Wt 188 lb (85.3 kg)   BMI 30.34 kg/m  Physical Exam  Constitutional: She is oriented to person, place, and time. She appears well-developed and well-nourished. No distress.  Musculoskeletal:  Noticeable limp favoring the left leg  Neurological: She is oriented to person, place, and time.  Skin: She is not diaphoretic.  Psychiatric: She has a normal mood and affect.    BACK  Tenderness lung lower back left SI joint left gluteus maximus Straight leg raise positive at 45 left leg   LEFT KNEE Small effusion medial lateral joint line tenderness left knee.  Knee range of motion 0-115 degrees. Knee stable in all planes. Muscle exam normal grade 5 motor strength in the quadriceps and hamstrings  Skin incision no erythema or rash  Decreased sensation dorsum left foot  RIGHT KNEE  NORMAL ROM  STABILITY STRENGTH NO EFFUSION   Encounter Diagnoses  Name Primary?  . Chronic pain syndrome Yes  . Radicular pain of left lower extremity   . Tibial plateau fracture, left, sequela   . Post-traumatic osteoarthritis of left knee    Procedure note left knee injection verbal consent was obtained to inject left knee joint  Timeout was completed to confirm the site of injection  The medications used were 40 mg of Depo-Medrol and 1% lidocaine 3 cc  Anesthesia was provided by ethyl chloride and the skin was prepped with alcohol.  After cleaning the skin with alcohol a 20-gauge needle was used to inject the left knee joint. There were no complications. A sterile bandage was applied.    DR Legrand Rams WILL REFER TO NEUROSURGERY FOR WORSENING Lower back and left leg pain

## 2017-04-19 NOTE — Patient Instructions (Addendum)
Steps to Quit Smoking Smoking tobacco can be bad for your health. It can also affect almost every organ in your body. Smoking puts you and people around you at risk for many serious Lex Linhares-lasting (chronic) diseases. Quitting smoking is hard, but it is one of the best things that you can do for your health. It is never too late to quit. What are the benefits of quitting smoking? When you quit smoking, you lower your risk for getting serious diseases and conditions. They can include:  Lung cancer or lung disease.  Heart disease.  Stroke.  Heart attack.  Not being able to have children (infertility).  Weak bones (osteoporosis) and broken bones (fractures).  If you have coughing, wheezing, and shortness of breath, those symptoms may get better when you quit. You may also get sick less often. If you are pregnant, quitting smoking can help to lower your chances of having a baby of low birth weight. What can I do to help me quit smoking? Talk with your doctor about what can help you quit smoking. Some things you can do (strategies) include:  Quitting smoking totally, instead of slowly cutting back how much you smoke over a period of time.  Going to in-person counseling. You are more likely to quit if you go to many counseling sessions.  Using resources and support systems, such as: ? Online chats with a counselor. ? Phone quitlines. ? Printed self-help materials. ? Support groups or group counseling. ? Text messaging programs. ? Mobile phone apps or applications.  Taking medicines. Some of these medicines may have nicotine in them. If you are pregnant or breastfeeding, do not take any medicines to quit smoking unless your doctor says it is okay. Talk with your doctor about counseling or other things that can help you.  Talk with your doctor about using more than one strategy at the same time, such as taking medicines while you are also going to in-person counseling. This can help make  quitting easier. What things can I do to make it easier to quit? Quitting smoking might feel very hard at first, but there is a lot that you can do to make it easier. Take these steps:  Talk to your family and friends. Ask them to support and encourage you.  Call phone quitlines, reach out to support groups, or work with a counselor.  Ask people who smoke to not smoke around you.  Avoid places that make you want (trigger) to smoke, such as: ? Bars. ? Parties. ? Smoke-break areas at work.  Spend time with people who do not smoke.  Lower the stress in your life. Stress can make you want to smoke. Try these things to help your stress: ? Getting regular exercise. ? Deep-breathing exercises. ? Yoga. ? Meditating. ? Doing a body scan. To do this, close your eyes, focus on one area of your body at a time from head to toe, and notice which parts of your body are tense. Try to relax the muscles in those areas.  Download or buy apps on your mobile phone or tablet that can help you stick to your quit plan. There are many free apps, such as QuitGuide from the CDC (Centers for Disease Control and Prevention). You can find more support from smokefree.gov and other websites.  This information is not intended to replace advice given to you by your health care provider. Make sure you discuss any questions you have with your health care provider. Document Released: 05/15/2009 Document   Revised: 03/16/2016 Document Reviewed: 12/03/2014 Elsevier Interactive Patient Education  2018 [place] have received an injection of steroids into the joint. 15% of patients will have increased pain within the 24 hours postinjection.   This is transient and will go away.   We recommend that you use ice packs on the injection site for 20 minutes every 2 hours and extra strength Tylenol 2 tablets every 8 as needed until the pain resolves.  If you continue to have pain after taking the Tylenol and using the  ice please call the office for further instructions.

## 2017-04-20 DIAGNOSIS — J449 Chronic obstructive pulmonary disease, unspecified: Secondary | ICD-10-CM | POA: Diagnosis not present

## 2017-04-20 DIAGNOSIS — I83893 Varicose veins of bilateral lower extremities with other complications: Secondary | ICD-10-CM | POA: Diagnosis not present

## 2017-04-20 DIAGNOSIS — I10 Essential (primary) hypertension: Secondary | ICD-10-CM | POA: Diagnosis not present

## 2017-04-25 ENCOUNTER — Other Ambulatory Visit: Payer: Self-pay | Admitting: Internal Medicine

## 2017-04-25 DIAGNOSIS — M545 Low back pain, unspecified: Secondary | ICD-10-CM

## 2017-04-25 DIAGNOSIS — G8929 Other chronic pain: Secondary | ICD-10-CM

## 2017-05-05 ENCOUNTER — Ambulatory Visit
Admission: RE | Admit: 2017-05-05 | Discharge: 2017-05-05 | Disposition: A | Discharge status: Home / Self Care | Payer: Medicare HMO | Source: Ambulatory Visit | Attending: Internal Medicine | Admitting: Internal Medicine

## 2017-05-05 DIAGNOSIS — G8929 Other chronic pain: Secondary | ICD-10-CM

## 2017-05-05 DIAGNOSIS — M545 Low back pain, unspecified: Secondary | ICD-10-CM

## 2017-05-05 DIAGNOSIS — M5126 Other intervertebral disc displacement, lumbar region: Secondary | ICD-10-CM | POA: Diagnosis not present

## 2017-05-05 MED ORDER — IOPAMIDOL (ISOVUE-M 200) INJECTION 41%
1.0000 mL | Freq: Once | INTRAMUSCULAR | Status: AC
  Administered 2017-05-05: 1 mL via EPIDURAL

## 2017-05-05 MED ORDER — METHYLPREDNISOLONE ACETATE 40 MG/ML INJ SUSP (RADIOLOG
120.0000 mg | Freq: Once | INTRAMUSCULAR | Status: AC
  Administered 2017-05-05: 120 mg via EPIDURAL

## 2017-05-05 NOTE — Discharge Instructions (Signed)

## 2017-05-09 ENCOUNTER — Encounter: Payer: Self-pay | Admitting: Gastroenterology

## 2017-05-09 ENCOUNTER — Ambulatory Visit (INDEPENDENT_AMBULATORY_CARE_PROVIDER_SITE_OTHER): Payer: Medicare HMO | Admitting: Gastroenterology

## 2017-05-09 VITALS — BP 141/90 | HR 78 | Temp 99.4°F | Ht 66.0 in | Wt 190.8 lb

## 2017-05-09 DIAGNOSIS — K5900 Constipation, unspecified: Secondary | ICD-10-CM | POA: Diagnosis not present

## 2017-05-09 DIAGNOSIS — K7030 Alcoholic cirrhosis of liver without ascites: Secondary | ICD-10-CM | POA: Diagnosis not present

## 2017-05-09 DIAGNOSIS — R1013 Epigastric pain: Secondary | ICD-10-CM | POA: Diagnosis not present

## 2017-05-09 MED ORDER — PANTOPRAZOLE SODIUM 40 MG PO TBEC: 40.0000 mg | DELAYED_RELEASE_TABLET | Freq: Every day | ORAL | 5 refills | Status: DC

## 2017-05-09 MED ORDER — PLECANATIDE 3 MG PO TABS: 1.0000 | ORAL_TABLET | Freq: Every day | ORAL | 5 refills | Status: DC

## 2017-05-09 NOTE — Assessment & Plan Note (Signed)
Poorly controlled. Patient tried to Amitiza samples 24 g twice a day. Not clear how regularly she took them. She is not real sure on her medications. She tells me she was taken him to use along with Linzess 72 g daily. Currently just on Linzess. Going days in between without a BM. Some relief in abdominal pain after BMs. She has gained 25 pounds in the past year.  Stop Linzess and Amitiza. Try Trulance 3mg  daily, samples and prescription provided. See her back in 6 weeks. Call sooner if no improvement in her bowel movements.

## 2017-05-09 NOTE — Progress Notes (Signed)
Primary Care Physician: Rosita Fire, MD  Primary Gastroenterologist:  Garfield Cornea, MD   Chief Complaint  Patient presents with  . Abdominal Pain    same as last ov.    HPI: Colleen Lee is a 61 y.o. female here for f/u. Last seen in 03/2017. 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 Next EGD due in July 2019, colonoscopy for surveillance to 2019 as well.  She has gained 25 pounds in the last 1 year. Continues to complain of epigastric pain, distention and bloating. She reports that her bowel movements are very irregular. She goes days in between B illness. She's been to the ED twice since we last saw her. Plain abdominal film unremarkable. Labs showed minimally elevated white blood cell count, lipase, hyponatremia and hypokalemia, elevated creatinine.  She did not bring her medicines today. She is not real clear on what she is taking. We believe she is not on pantoprazole, Bentyl, hematochezia. She is taking Linzess 30mcg daily. We will contact pharmacy and they are open to obtain medication list.  Has heartburn every time she eats. Complains of epigastric pain. Worse with meals. Feels bloated. Some relief when she has a bowel movement. Has cut back on soft drinks. Trying to drink just water but she doesn't like it. Denies alcohol consumption. No melena or rectal bleeding.    MEDICATIONS UPDATED FROM PHARMACY LIST AS DOCUMENTED.  Current Outpatient Prescriptions  Medication Sig Dispense Refill  . aspirin 325 MG EC tablet Take 1 tablet (325 mg total) by mouth daily. 30 tablet 12  . budesonide-formoterol (SYMBICORT) 160-4.5 MCG/ACT inhaler Inhale 2 puffs into the lungs 2 (two) times daily.    . Cyanocobalamin (VITAMIN B 12 PO) Take 1 tablet by mouth daily.    . ergocalciferol (VITAMIN D2) 50000 units capsule Take 50,000 Units by mouth  every 30 (thirty) days.    Marland Kitchen linaclotide (LINZESS) 72 MCG capsule Take 1 capsule (72 mcg total) by mouth daily before breakfast. 30 capsule 3  . lisinopril (PRINIVIL,ZESTRIL) 20 MG tablet Take 20 mg by mouth daily.     . megestrol (MEGACE) 40 MG tablet Take 40 mg by mouth daily.     No current facility-administered medications for this visit.     Allergies as of 05/09/2017 - Review Complete 05/09/2017  Allergen Reaction Noted  . Penicillins Rash     ROS:  General: Negative for anorexia, weight loss, fever, chills, fatigue, weakness. ENT: Negative for hoarseness, difficulty swallowing , nasal congestion. CV: Negative for chest pain, angina, palpitations, dyspnea on exertion, peripheral edema.  Respiratory: Negative for dyspnea at rest, dyspnea on exertion, cough, sputum, wheezing.  GI: See history of present illness. GU:  Negative for dysuria, hematuria, urinary incontinence, urinary frequency, nocturnal urination.  Endo: Negative for unusual weight change.    Physical Examination:   BP (!) 141/90   Pulse 78   Temp 99.4 F (37.4 C) (Oral)   Ht 5\' 6"  (1.676 m)   Wt 190 lb 12.8 oz (86.5 kg)   BMI 30.80 kg/m   General: Well-nourished, well-developed in no acute distress.  Eyes: No icterus. Mouth: Oropharyngeal mucosa moist and pink , no lesions erythema or exudate. Lungs: Clear to auscultation bilaterally.  Heart: Regular rate and rhythm, no murmurs rubs or gallops.  Abdomen: Bowel sounds are normal, mild diastasis recti, mild epig tenderness, protruberant but soft, no hepatosplenomegaly or masses, no  abdominal bruits or hernia , no rebound or guarding.   Extremities: No lower extremity edema. No clubbing or deformities. Neuro: Alert and oriented x 4   Skin: Warm and dry, no jaundice.   Psych: Alert and cooperative, normal mood and affect.  Labs:  Lab Results  Component Value Date   CREATININE 1.62 (H) 03/31/2017   BUN 35 (H) 03/31/2017   NA 129 (L) 03/31/2017   K 3.2  (L) 03/31/2017   CL 97 (L) 03/31/2017   CO2 18 (L) 03/31/2017   Lab Results  Component Value Date   ALT 20 03/31/2017   AST 23 03/31/2017   ALKPHOS 53 03/31/2017   BILITOT 1.8 (H) 03/31/2017   Lab Results  Component Value Date   WBC 11.1 (H) 03/31/2017   HGB 16.6 (H) 03/31/2017   HCT 45.9 03/31/2017   MCV 88.4 03/31/2017   PLT 219 03/31/2017   Lab Results  Component Value Date   LIPASE 73 (H) 03/31/2017     Imaging Studies: Dg Inject Diag/thera/inc Needle/cath/plc Epi/lumb/sac W/img  Result Date: 05/05/2017 CLINICAL DATA:  Chronic low back pain without sciatica. Left lower extremity radiculitis. Disc bulging and annular tear at L5-S1. FLUOROSCOPY TIME:  Radiation Exposure Index (as provided by the fluoroscopic device): 16.79 uGy*m2 Fluoroscopy Time:  17 seconds Number of Acquired Images:  0 PROCEDURE: The procedure, risks, benefits, and alternatives were explained to the patient. Questions regarding the procedure were encouraged and answered. The patient understands and consents to the procedure. LUMBAR EPIDURAL INJECTION: An interlaminar approach was performed on the left at L5-S1. The overlying skin was cleansed and anesthetized. A 20 gauge epidural needle was advanced using loss-of-resistance technique. DIAGNOSTIC EPIDURAL INJECTION: Injection of Isovue-M 200 shows a good epidural pattern with spread above and below the level of needle placement, primarily on the left no vascular opacification is seen. THERAPEUTIC EPIDURAL INJECTION: 120 Mg of Depo-Medrol mixed with 3 mL 1% lidocaine were instilled. The procedure was well-tolerated, and the patient was discharged thirty minutes following the injection in good condition. COMPLICATIONS: None IMPRESSION: Technically successful epidural injection on the left at L5-S1 # 1 Electronically Signed   By: San Morelle M.D.   On: 05/05/2017 16:02

## 2017-05-09 NOTE — Assessment & Plan Note (Signed)
Chronic epigastric pain. Now she has come off the PPI at some point. Complaining of refractory heartburn. Also poorly managed constipation. Abnormal labs as outlined above. Will repeat. Resume pantoprazole 40 mg daily. She denies alcohol consumption. Further recommendations to follow.

## 2017-05-09 NOTE — Assessment & Plan Note (Addendum)
Currently hepatoma screening and labs up-to-date. Due again in February. No evidence of ascites on recent imaging.

## 2017-05-09 NOTE — Patient Instructions (Addendum)
1. Please have your labs done.  2. I have sent in prescription for Trulance to your pharmacy. Try the samples FIRST and if they work then get prescription filled.  3. Start pantoprazole once daily before breakfast for acid reflux. RX sent to your pharmacy.  4. Return to the office in six weeks, call sooner if needed.

## 2017-05-10 NOTE — Progress Notes (Signed)
cc'ed to pcp °

## 2017-06-18 ENCOUNTER — Encounter (HOSPITAL_COMMUNITY): Payer: Self-pay | Admitting: Emergency Medicine

## 2017-06-18 ENCOUNTER — Emergency Department (HOSPITAL_COMMUNITY)
Admission: EM | Admit: 2017-06-18 | Discharge: 2017-06-18 | Disposition: A | Discharge status: Home / Self Care | Payer: Medicare HMO | Attending: Emergency Medicine | Admitting: Emergency Medicine

## 2017-06-18 DIAGNOSIS — R112 Nausea with vomiting, unspecified: Secondary | ICD-10-CM | POA: Insufficient documentation

## 2017-06-18 DIAGNOSIS — Z5321 Procedure and treatment not carried out due to patient leaving prior to being seen by health care provider: Secondary | ICD-10-CM | POA: Insufficient documentation

## 2017-06-18 NOTE — ED Notes (Signed)
Pt states she has not kept anything down but is drinking mt dew.

## 2017-06-18 NOTE — ED Notes (Signed)
edp went in to room and pt was not in the room.  Checked around ed and couldn't find pt

## 2017-06-18 NOTE — ED Triage Notes (Signed)
Pt reports n/v/d since 0300 this morning with abd pain.

## 2017-06-22 ENCOUNTER — Encounter: Payer: Self-pay | Admitting: Gastroenterology

## 2017-06-22 ENCOUNTER — Ambulatory Visit (INDEPENDENT_AMBULATORY_CARE_PROVIDER_SITE_OTHER): Payer: Medicare HMO | Admitting: Gastroenterology

## 2017-06-22 ENCOUNTER — Ambulatory Visit: Payer: Medicare HMO | Admitting: Gastroenterology

## 2017-06-22 VITALS — BP 143/87 | HR 85 | Temp 99.1°F | Ht 66.0 in | Wt 190.2 lb

## 2017-06-22 DIAGNOSIS — K7460 Unspecified cirrhosis of liver: Secondary | ICD-10-CM

## 2017-06-22 DIAGNOSIS — R1084 Generalized abdominal pain: Secondary | ICD-10-CM | POA: Diagnosis not present

## 2017-06-22 DIAGNOSIS — K219 Gastro-esophageal reflux disease without esophagitis: Secondary | ICD-10-CM

## 2017-06-22 DIAGNOSIS — K5900 Constipation, unspecified: Secondary | ICD-10-CM

## 2017-06-22 NOTE — Patient Instructions (Signed)
1. You need to have labs and ultrasound in 09/2017. We will contact you sooner to the date and have it scheduled.  2. Continue pantoprazole and Trulance.  3. Return to the office in six months. You will be due upper endoscopy and colonoscopy in mid-2019.

## 2017-06-22 NOTE — Assessment & Plan Note (Signed)
Nonspecific abdominal pain much improved with bettetr management of her constipation.  Clinically she is doing much better.  Back in August she had minimally elevated lipase, nonspecific.  At this time we will continue to monitor.  If you have any recurrent abdominal pain recurrent significant abdominal pain, would consider repeating labs and/or imaging.  She will call us if she needs [place].  Otherwise see her back in 6 months.

## 2017-06-22 NOTE — Assessment & Plan Note (Signed)
Well compensated.  Next ultrasound and labs due in February.  Next EGD planned for July 2019 for esophageal variceal screening.  She is due for a colonoscopy for surveillance purposes at that time as well.  Return to the office in May 2019, call sooner if needed.

## 2017-06-22 NOTE — Progress Notes (Signed)
cc'ed to pcp °

## 2017-06-22 NOTE — Progress Notes (Signed)
      Primary Care Physician: Rosita Fire, MD  Primary Gastroenterologist:  Garfield Cornea, MD   Chief Complaint  Patient presents with  . Abdominal Pain    HPI: Colleen Lee is a 61 y.o. female here for follow-up.  She was last seen in October.  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 Next EGD due in July 2019, colonoscopy for surveillance to 2019 as well.  Restarted her pantoprazole once daily at last office visit.  Switched her to trulance for constipation. She did not have her labs done.   BM twice per day now as opposed to once every 4-5 days. No straining.  No rectal bleeding or melena.  Abdominal pain much improved.  Appetite good.  Denies alcohol use.  No heartburn.  Current Outpatient Medications  Medication Sig Dispense Refill  . aspirin 325 MG EC tablet Take 1 tablet (325 mg total) by mouth daily. 30 tablet 12  . budesonide-formoterol (SYMBICORT) 160-4.5 MCG/ACT inhaler Inhale 2 puffs into the lungs 2 (two) times daily.    . Cyanocobalamin (VITAMIN B 12 PO) Take 1 tablet by mouth daily.    . ergocalciferol (VITAMIN D2) 50000 units capsule Take 50,000 Units by mouth every 30 (thirty) days.    Marland Kitchen lisinopril (PRINIVIL,ZESTRIL) 20 MG tablet Take 20 mg by mouth daily.     . megestrol (MEGACE) 40 MG tablet Take 40 mg by mouth daily.    . pantoprazole (PROTONIX) 40 MG tablet Take 1 tablet (40 mg total) by mouth daily before breakfast. (Patient taking differently: Take 40 mg by mouth as needed. ) 30 tablet 5  . Plecanatide (TRULANCE) 3 MG TABS Take 1 tablet by mouth daily. 30 tablet 5   No current facility-administered medications for this visit.     Allergies as of 06/22/2017 - Review Complete 06/22/2017  Allergen Reaction Noted  .  Penicillins Rash     ROS:  General: Negative for anorexia, weight loss, fever, chills, fatigue, weakness. ENT: Negative for hoarseness, difficulty swallowing , nasal congestion. CV: Negative for chest pain, angina, palpitations, dyspnea on exertion, peripheral edema.  Respiratory: Negative for dyspnea at rest, dyspnea on exertion, cough, sputum, wheezing.  GI: See history of present illness. GU:  Negative for dysuria, hematuria, urinary incontinence, urinary frequency, nocturnal urination.  Endo: Negative for unusual weight change.    Physical Examination:   BP (!) 143/87   Pulse 85   Temp 99.1 F (37.3 C) (Oral)   Ht 5\' 6"  (1.676 m)   Wt 190 lb 3.2 oz (86.3 kg)   BMI 30.70 kg/m   General: Well-nourished, well-developed in no acute distress.  Eyes: No icterus. Mouth: Oropharyngeal mucosa moist and pink , no lesions erythema or exudate. Lungs: Clear to auscultation bilaterally.  Heart: Regular rate and rhythm, no murmurs rubs or gallops.  Abdomen: Bowel sounds are normal, nontender, nondistended, no hepatosplenomegaly or masses, no abdominal bruits or hernia , no rebound or guarding.  Positive rectus diastases Extremities: No lower extremity edema. No clubbing or deformities. Neuro: Alert and oriented x 4   Skin: Warm and dry, no jaundice.   Psych: Alert and cooperative, normal mood and affect.

## 2017-06-22 NOTE — Assessment & Plan Note (Signed)
Currently doing very well.  Encouraged her to continue taking pantoprazole 40 mg daily.  See her back in 6 months.

## 2017-06-22 NOTE — Assessment & Plan Note (Signed)
Doing very well on Trulance 3 mg daily.

## 2017-06-27 ENCOUNTER — Other Ambulatory Visit: Payer: Self-pay

## 2017-06-27 DIAGNOSIS — K7460 Unspecified cirrhosis of liver: Secondary | ICD-10-CM

## 2017-06-28 DIAGNOSIS — K219 Gastro-esophageal reflux disease without esophagitis: Secondary | ICD-10-CM | POA: Diagnosis not present

## 2017-06-28 DIAGNOSIS — J449 Chronic obstructive pulmonary disease, unspecified: Secondary | ICD-10-CM | POA: Diagnosis not present

## 2017-06-28 DIAGNOSIS — M549 Dorsalgia, unspecified: Secondary | ICD-10-CM | POA: Diagnosis not present

## 2017-06-28 DIAGNOSIS — I10 Essential (primary) hypertension: Secondary | ICD-10-CM | POA: Diagnosis not present

## 2017-07-27 ENCOUNTER — Emergency Department (HOSPITAL_COMMUNITY)
Admission: EM | Admit: 2017-07-27 | Discharge: 2017-07-27 | Disposition: A | Discharge status: Home / Self Care | Payer: Medicare Other | Attending: Emergency Medicine | Admitting: Emergency Medicine

## 2017-07-27 ENCOUNTER — Emergency Department (HOSPITAL_COMMUNITY): Payer: Medicare Other

## 2017-07-27 ENCOUNTER — Encounter (HOSPITAL_COMMUNITY): Payer: Self-pay | Admitting: *Deleted

## 2017-07-27 DIAGNOSIS — Z7982 Long term (current) use of aspirin: Secondary | ICD-10-CM | POA: Diagnosis not present

## 2017-07-27 DIAGNOSIS — R197 Diarrhea, unspecified: Secondary | ICD-10-CM | POA: Insufficient documentation

## 2017-07-27 DIAGNOSIS — Z880 Allergy status to penicillin: Secondary | ICD-10-CM | POA: Diagnosis not present

## 2017-07-27 DIAGNOSIS — I10 Essential (primary) hypertension: Secondary | ICD-10-CM | POA: Insufficient documentation

## 2017-07-27 DIAGNOSIS — R1084 Generalized abdominal pain: Secondary | ICD-10-CM | POA: Diagnosis present

## 2017-07-27 DIAGNOSIS — J45909 Unspecified asthma, uncomplicated: Secondary | ICD-10-CM | POA: Insufficient documentation

## 2017-07-27 DIAGNOSIS — R112 Nausea with vomiting, unspecified: Secondary | ICD-10-CM | POA: Diagnosis not present

## 2017-07-27 DIAGNOSIS — F17210 Nicotine dependence, cigarettes, uncomplicated: Secondary | ICD-10-CM | POA: Insufficient documentation

## 2017-07-27 DIAGNOSIS — Z79899 Other long term (current) drug therapy: Secondary | ICD-10-CM | POA: Diagnosis not present

## 2017-07-27 DIAGNOSIS — R1032 Left lower quadrant pain: Secondary | ICD-10-CM | POA: Insufficient documentation

## 2017-07-27 LAB — COMPREHENSIVE METABOLIC PANEL
ALT: 18 U/L (ref 14–54)
AST: 17 U/L (ref 15–41)
Albumin: 5 g/dL (ref 3.5–5.0)
Alkaline Phosphatase: 61 U/L (ref 38–126)
Anion gap: 16 — ABNORMAL HIGH (ref 5–15)
BUN: 19 mg/dL (ref 6–20)
CO2: 18 mmol/L — ABNORMAL LOW (ref 22–32)
Calcium: 11.2 mg/dL — ABNORMAL HIGH (ref 8.9–10.3)
Chloride: 101 mmol/L (ref 101–111)
Creatinine, Ser: 1.04 mg/dL — ABNORMAL HIGH (ref 0.44–1.00)
GFR calc Af Amer: >60 mL/min (ref >60)
GFR calc non Af Amer: 57 mL/min — ABNORMAL LOW (ref >60)
Glucose, Bld: 102 mg/dL — ABNORMAL HIGH (ref 65–99)
Potassium: 4.2 mmol/L (ref 3.5–5.1)
Sodium: 135 mmol/L (ref 135–145)
Total Bilirubin: 1.7 mg/dL — ABNORMAL HIGH (ref 0.3–1.2)
Total Protein: 9.5 g/dL — ABNORMAL HIGH (ref 6.5–8.1)

## 2017-07-27 LAB — CBC WITH DIFFERENTIAL/PLATELET
Basophils Absolute: 0 10*3/uL (ref 0.0–0.1)
Basophils Relative: 0 %
Eosinophils Absolute: 0 10*3/uL (ref 0.0–0.7)
Eosinophils Relative: 0 %
HCT: 44.4 % (ref 36.0–46.0)
Hemoglobin: 15.3 g/dL — ABNORMAL HIGH (ref 12.0–15.0)
Lymphocytes Relative: 9 %
Lymphs Abs: 0.7 10*3/uL (ref 0.7–4.0)
MCH: 30.7 pg (ref 26.0–34.0)
MCHC: 34.5 g/dL (ref 30.0–36.0)
MCV: 89.2 fL (ref 78.0–100.0)
Monocytes Absolute: 0.5 10*3/uL (ref 0.1–1.0)
Monocytes Relative: 6 %
Neutro Abs: 6.2 10*3/uL (ref 1.7–7.7)
Neutrophils Relative %: 85 %
Platelets: 228 10*3/uL (ref 150–400)
RBC: 4.98 MIL/uL (ref 3.87–5.11)
RDW: 13.1 % (ref 11.5–15.5)
WBC: 7.3 10*3/uL (ref 4.0–10.5)

## 2017-07-27 LAB — URINALYSIS, ROUTINE W REFLEX MICROSCOPIC
Bacteria, UA: NONE SEEN
Bilirubin Urine: NEGATIVE
Glucose, UA: NEGATIVE mg/dL
Ketones, ur: 5 mg/dL — AB
Leukocytes, UA: NEGATIVE
Nitrite: NEGATIVE
Protein, ur: NEGATIVE mg/dL
Specific Gravity, Urine: 1.006 (ref 1.005–1.030)
pH: 5 (ref 5.0–8.0)

## 2017-07-27 LAB — I-STAT CG4 LACTIC ACID, ED
Lactic Acid, Venous: 2.42 mmol/L (ref 0.5–1.9)
Lactic Acid, Venous: 1.12 mmol/L (ref 0.5–1.9)

## 2017-07-27 LAB — RAPID URINE DRUG SCREEN, HOSP PERFORMED
Amphetamines: NOT DETECTED
Barbiturates: NOT DETECTED
Benzodiazepines: NOT DETECTED
Cocaine: NOT DETECTED
Opiates: POSITIVE — AB
Tetrahydrocannabinol: POSITIVE — AB

## 2017-07-27 LAB — ETHANOL: Alcohol, Ethyl (B): <10 mg/dL (ref <10)

## 2017-07-27 LAB — LIPASE, BLOOD: Lipase: 43 U/L (ref 11–51)

## 2017-07-27 MED ORDER — HALOPERIDOL LACTATE 5 MG/ML IJ SOLN
2.0000 mg | Freq: Once | INTRAMUSCULAR | Status: AC
  Administered 2017-07-27: 2 mg via INTRAVENOUS
  Filled 2017-07-27: qty 1

## 2017-07-27 MED ORDER — ACETAMINOPHEN 500 MG PO TABS
1000.0000 mg | ORAL_TABLET | Freq: Once | ORAL | Status: AC
  Administered 2017-07-27: 1000 mg via ORAL
  Filled 2017-07-27: qty 2

## 2017-07-27 MED ORDER — SODIUM CHLORIDE 0.9 % IV BOLUS (SEPSIS)
1000.0000 mL | Freq: Once | INTRAVENOUS | Status: AC
  Administered 2017-07-27: 1000 mL via INTRAVENOUS

## 2017-07-27 MED ORDER — ONDANSETRON HCL 4 MG/2ML IJ SOLN
4.0000 mg | Freq: Once | INTRAMUSCULAR | Status: AC
  Administered 2017-07-27: 4 mg via INTRAVENOUS
  Filled 2017-07-27: qty 2

## 2017-07-27 MED ORDER — METOCLOPRAMIDE HCL 10 MG PO TABS
10.0000 mg | ORAL_TABLET | Freq: Once | ORAL | Status: AC
  Administered 2017-07-27: 10 mg via ORAL
  Filled 2017-07-27: qty 1

## 2017-07-27 MED ORDER — METOCLOPRAMIDE HCL 10 MG PO TABS: 10.0000 mg | ORAL_TABLET | Freq: Four times a day (QID) | ORAL | 0 refills | Status: DC

## 2017-07-27 MED ORDER — FENTANYL CITRATE (PF) 100 MCG/2ML IJ SOLN
50.0000 ug | Freq: Once | INTRAMUSCULAR | Status: AC
  Administered 2017-07-27: 50 ug via INTRAVENOUS
  Filled 2017-07-27 (×2): qty 2

## 2017-07-27 MED ORDER — FAMOTIDINE IN NACL 20-0.9 MG/50ML-% IV SOLN
20.0000 mg | INTRAVENOUS | Status: AC
  Administered 2017-07-27: 20 mg via INTRAVENOUS
  Filled 2017-07-27: qty 50

## 2017-07-27 NOTE — ED Triage Notes (Signed)
Abdominal pain with vomiting 

## 2017-07-27 NOTE — ED Notes (Signed)
Another RN trying to get Ultrasound IV

## 2017-07-27 NOTE — ED Notes (Signed)
Pt told that we need a urine sample. Pt states that she has already urinated. Pt told to let us know when she has to urinate again.

## 2017-07-27 NOTE — Discharge Instructions (Signed)
Your testing was unremarkable, it showed that you have been using marijuana, this can definitely make your symptoms worsen you should stop using marijuana immediately.  Drink plenty of fluids, Reglan may help to increase your bowels to prevent pain or vomiting.  ER for severe or worsening symptoms

## 2017-07-27 NOTE — ED Provider Notes (Signed)
[facility] EMERGENCY DEPARTMENT Provider Note   CSN: [id number] Arrival date & time: 07/27/17  2831     History   Chief Complaint Chief Complaint  Patient presents with  . Abdominal Pain  . Emesis    HPI Colleen Lee is a 61 y.o. female.  HPI  The patient is a 61 year old female, she is followed by Dr. Gala Romney with gastroenterology secondary to a history of alcoholic cirrhosis of the liver without ascites, she has also been diagnosed with gastroparesis with acid reflux disease and some degree of gastritis.  The patient has been placed on a proton pump inhibitor pantoprazole, she has chronic constipation, she reports that she still has the occasional alcohol and last notes having alcohol in the Thanksgiving holiday approximately 1 month ago.  She presents to the hospital today with a complaint of pain in her abdomen with nausea and decreased appetite.  She has also had some associated diarrhea.  She reports this feels similar to prior episodes where she has gastroparesis, she has difficulty eating or drinking and then becomes dehydrated.  She states that the pain is located all over, it is nonfocal, she points to the epigastrium but also points to the lower abdomen especially on the left.  She has had no fevers or chills and no rashes or swelling of the legs but she does endorse having 3 watery stools today.  She has been taking very small amounts of liquid and occasional applesauce but denies any significant food intake in the last month.  Endoscopy performed approximately 1-1/2 years ago showed mild portal gastropathy of the stomach but otherwise the gastric mucosa appeared normal, the duodenum tissue also appeared normal.  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   Past Medical History:    Diagnosis Date  . Asthma   . Chronic back pain   . Cirrhosis ([place])    Metavir score F4 on elastography 2015  . Fibroids   . Fibromyalgia   . GERD (gastroesophageal reflux disease)   . H. pylori infection 2014   treated with pylera, had to be treated again as it was not eradicated. Urea breath test then negative after subsequent treatment.   . [name]    HCV RNA positive 09/2012  . Hypertension   . PONV (postoperative nausea and vomiting)    pt thinks maybe once she had N&V  . Sciatica of left side     Patient Active Problem List   Diagnosis Date Noted  . Gastroenteritis 06/14/2016  . Nausea with vomiting 06/10/2016  . Uterine enlargement 06/09/2016  . Intractable nausea and vomiting 06/08/2016  . Acute infective gastroenteritis 06/08/2016  . Diarrhea 06/08/2016  . Essential hypertension 06/08/2016  . GERD (gastroesophageal reflux disease) 06/08/2016  . Abdominal pain   . Endometrial polyp 04/15/2016  . PMB (postmenopausal bleeding) 02/27/2016  . Alcoholic cirrhosis of liver without ascites ([place])   . Asthma 01/21/2016  . Hepatic cirrhosis ([place]) 09/22/2015  . Arthritis of knee, degenerative 02/05/2015  . History of Helicobacter pylori infection 10/30/2014  . Chronic [name] with cirrhosis ([place]) 04/11/2014  . De Quervain's disease (radial styloid tenosynovitis) 12/25/2013  . Anorexia 11/21/2012  . FH: colon cancer 11/21/2012  . Early satiety 10/25/2012  . Bowel habit changes 10/25/2012  . Abdominal pain, epigastric 10/25/2012  .  Abdominal bloating 10/25/2012  . Constipation 10/25/2012  . Abnormal weight loss 10/25/2012  . Chronic viral [name] ([place]) 10/25/2012  . Radicular pain of left lower extremity 09/28/2012  . Back pain 09/28/2012  . Sciatica 08/10/2011  . S/P arthroscopy of left knee 08/10/2011  . Tibial plateau fracture 08/10/2011  . Pain in joint, lower leg 02/12/2011  . Stiffness of joint, not elsewhere classified, lower leg 02/12/2011  .  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 Meniscus, medial, derangement 12/29/2010  . CLOSED FRACTURE OF UPPER END OF TIBIA 08/12/2010    Past Surgical History:  Procedure Laterality Date  . COLONOSCOPY  01/18/2008   ZCH:YIFOYD rectum.  Long redundant colon, a diminutive sigmoid polyp status post cold biopsy removed. Hyperplastic polyp. Repeat colonoscopy June 2014 due to family history of colon cancer  . COLONOSCOPY WITH ESOPHAGOGASTRODUODENOSCOPY (EGD) N/A 11/02/2012   XAJ:OINOMVEH gastric mucosa of doubtful, +H.pylori. Incomplete colonoscopy due to patient unable to tolerate exam, proximal colon seen. Patient refused ACBE.  Marland Kitchen COLONOSCOPY WITH PROPOFOL N/A 03/08/2013   Dr. Gala Romney: colonic polyp-removed as scribed above. Internal Hemorrhoids. Pathology did not reveal any colonic tissue, only mucus. SURVEILLANCE DUE Aug 2019  . ESOPHAGOGASTRODUODENOSCOPY  01/18/2008   RMR: Normal esophagus, normal  stomach  . ESOPHAGOGASTRODUODENOSCOPY (EGD) WITH PROPOFOL N/A 02/09/2016   Normal esophagus, small hiatal hernia, portal hypertensive gastropathy, normal second portion of duodenum. Due in July 2019  . HYSTEROSCOPY  05/11/2016   Procedure: HYSTEROSCOPY;  Surgeon: Jonnie Kind, MD;  Location: [facility];  Service: Gynecology;;  . KNEE SURGERY     left knee  . MULTIPLE EXTRACTIONS WITH ALVEOLOPLASTY N/A 10/15/2013   Procedure: MULTIPLE EXTRACTION WITH ALVEOLOPLASTY;  Surgeon: Gae Bon, DDS;  Location: [facility];  Service: Oral Surgery;  Laterality: N/A;  . POLYPECTOMY N/A 03/08/2013   Procedure: POLYPECTOMY;  Surgeon: Daneil Dolin, MD;  Location: [facility];  Service: Endoscopy;  Laterality: N/A;  . POLYPECTOMY N/A 05/11/2016   Procedure: REMOVAL OF ENDOMETRIAL POLYP;  Surgeon: Jonnie Kind, MD;  Location: [facility];  Service: Gynecology;  Laterality: N/A;  . TOTAL KNEE ARTHROPLASTY Left 02/05/2015   Procedure: LEFT TOTAL KNEE ARTHROPLASTY;  Surgeon: Carole Civil, MD;  Location: [facility];  Service:  Orthopedics;  Laterality: Left;    OB History    Gravida Para Term Preterm AB Living             1   SAB TAB Ectopic Multiple Live Births                   Home Medications    Prior to Admission medications   Medication Sig Start Date End Date Taking? Authorizing Provider  aspirin 325 MG EC tablet Take 1 tablet (325 mg total) by mouth daily. 06/13/16   Sinda Du, MD  budesonide-formoterol [facility]) 160-4.5 MCG/ACT inhaler Inhale 2 puffs into the lungs 2 (two) times daily.    [provider]  Cyanocobalamin (VITAMIN B 12 PO) Take 1 tablet by mouth daily.    [provider]  ergocalciferol (VITAMIN D2) 50000 units capsule Take 50,000 Units by mouth every 30 (thirty) days.    [provider]  lisinopril (PRINIVIL,ZESTRIL) 20 MG tablet Take 20 mg by mouth daily.  09/25/15   [provider]  megestrol (MEGACE) 40 MG tablet Take 40 mg by mouth daily.    [provider]  metoCLOPramide (REGLAN) 10 MG tablet Take 1 tablet (10 mg total) by mouth every 6 (six) hours.  07/27/17   Noemi Chapel, MD  pantoprazole (PROTONIX) 40 MG tablet Take 1 tablet (40 mg total) by mouth daily before breakfast. Patient taking differently: Take 40 mg by mouth as needed.  05/09/17   Mahala Menghini, PA-C  Plecanatide (TRULANCE) 3 MG TABS Take 1 tablet by mouth daily. 05/09/17   Mahala Menghini, PA-C    Family History Family History  Problem Relation Age of Onset  . Colon cancer Brother 68          . Multiple myeloma Brother   . Liver cancer Sister   . Prostate cancer Brother   . Pancreatic cancer Brother   . Cancer Mother        breast  . Asthma Mother     Social History Social History   Tobacco Use  . Smoking status: Current Every Day Smoker    Packs/day: 0.50    Years: 40.00    Pack years: 20.00    Types: Cigarettes  . Smokeless tobacco: Never Used  . Tobacco comment: Smokes one pack of cigarettes daily  Substance Use Topics  . Alcohol use:  No    Alcohol/week: 0.0 oz  . Drug use: No    Comment: history of marijuana in past     Allergies   Penicillins   Review of Systems Review of Systems  All other systems reviewed and are negative.    Physical Exam Updated Vital Signs BP (!) 176/84   Pulse 75   Temp 98.5 F (36.9 C) (Oral)   Resp 14   Ht '5\' 6"'$  (1.676 m)   Wt 85.7 kg (189 lb)   SpO2 100%   BMI 30.51 kg/m   Physical Exam  Constitutional:  The patient is in uncomfortable appearing however she does not appear to be in distress  HENT:  Mucous membranes are mildly dehydrated, otherwise atraumatic  Eyes:  Conjunctive are clear, the pupils are reactive, no discharge or periorbital swelling  Cardiovascular:  Regular rate and rhythm without any murmurs or tachycardia, normal pulses at the radial arteries  Pulmonary/Chest:  Lungs are clear, mild expiratory wheezing in the posterior lungs fields but speaks in full sentences without any accessory muscle use  Abdominal:  Abdominal exam with mild diffuse tenderness, it is soft, she has no guarding, she pushes my hand away intermittently.  There is no focal tenderness, the exam is similar in all 4 quadrants in the periumbilical region, there is no fluid wave, there is no tympanitic sounds to percussion  Musculoskeletal:  There is no peripheral edema, the joints are supple and the compartments are soft  Neurological:  The patient speech is clear, she is able to follow commands, moves all 4 extremities with normal strength and coordination  Skin:  No apparent rashes to the skin     ED Treatments / Results  Labs (all labs ordered are listed, but only abnormal results are displayed) Labs Reviewed  CBC WITH DIFFERENTIAL/PLATELET - Abnormal; Notable for the following components:      Result Value   Hemoglobin 15.3 (*)    All other components within normal limits  COMPREHENSIVE METABOLIC PANEL - Abnormal; Notable for the following components:   CO2 18 (*)     Glucose, Bld 102 (*)    Creatinine, Ser 1.04 (*)    Calcium 11.2 (*)    Total Protein 9.5 (*)    Total Bilirubin 1.7 (*)    GFR calc non Af Amer 57 (*)    Anion gap 16 (*)  All other components within normal limits  URINALYSIS, ROUTINE W REFLEX MICROSCOPIC - Abnormal; Notable for the following components:   Color, Urine STRAW (*)    Hgb urine dipstick SMALL (*)    Ketones, ur 5 (*)    Squamous Epithelial / LPF 0-5 (*)    All other components within normal limits  RAPID URINE DRUG SCREEN, HOSP PERFORMED - Abnormal; Notable for the following components:   Opiates POSITIVE (*)    Tetrahydrocannabinol POSITIVE (*)    All other components within normal limits  I-STAT CG4 LACTIC ACID, ED - Abnormal; Notable for the following components:   Lactic Acid, Venous 2.42 (*)    All other components within normal limits  LIPASE, BLOOD  ETHANOL  I-STAT CG4 LACTIC ACID, ED     Radiology Dg Abd Acute W/chest  Result Date: 07/27/2017 CLINICAL DATA:  Abdominal pain and vomiting EXAM: DG ABDOMEN ACUTE W/ 1V CHEST COMPARISON:  Ultrasound 03/22/2017.  Radiography 12/17/2016. FINDINGS: Bowel gas pattern is normal without evidence of ileus, obstruction or free air. Benign pelvic calcifications are present. Ordinary mild degenerative changes of the spine and hips. One-view chest shows normal heart size. The aorta is slightly tortuous. Chronically prominent interstitial markings appear similar. No free air seen under the diaphragm. IMPRESSION: Negative abdominal radiographs.  No acute cardiopulmonary disease. Electronically Signed   By: Nelson Chimes M.D.   On: 07/27/2017 13:34    Procedures Procedures (including critical care time)  Medications Ordered in ED Medications  acetaminophen (TYLENOL) tablet 1,000 mg (not administered)  metoCLOPramide (REGLAN) tablet 10 mg (not administered)  haloperidol lactate (HALDOL) injection 2 mg (2 mg Intravenous Given 07/27/17 1221)  sodium chloride 0.9 % bolus  1,000 mL (0 mLs Intravenous Stopped 07/27/17 1427)  famotidine (PEPCID) IVPB 20 mg premix (0 mg Intravenous Stopped 07/27/17 1328)  ondansetron (ZOFRAN) injection 4 mg (4 mg Intravenous Given 07/27/17 1255)  fentaNYL (SUBLIMAZE) injection 50 mcg (50 mcg Intravenous Given 07/27/17 1255)     Initial Impression / Assessment and Plan / ED Course  I have reviewed the triage vital signs and the nursing notes.  Pertinent labs & imaging results that were available during my care of the patient were reviewed by me and considered in my medical decision making (see chart for details).     The etiology of the patient's symptoms is unclear though I suspect there has some element of gastroparesis involved.  We will check a lipase, lactic acid, blood counts, at this time I do not necessarily think a CT scan is indicated however we will be aggressive with treatment of what appears to be a primary GI abnormality.  There are no focal findings to suggest either pancreatitis cholecystitis appendicitis or diverticulitis and I doubt a aneurysm given CT scans in the past  Lactic acid improved to normal, alcohol was undetectable, marijuana positive.  Acute abdominal series negative for acute findings  Urinalysis negative for dehydration or infection  Patient had IV fluids, p.o. trial and ambulated, successfully.  Patient informed of results and treatment plan including outpatient Reglan.  Stable for discharge  Final Clinical Impressions(s) / ED Diagnoses   Final diagnoses:  Generalized abdominal pain  Nausea and vomiting, intractability of vomiting not specified, unspecified vomiting type    ED Discharge Orders        Ordered    metoCLOPramide (REGLAN) 10 MG tablet  Every 6 hours     07/27/17 1542       Noemi Chapel, MD 07/27/17 [phone]

## 2017-07-27 NOTE — ED Notes (Signed)
Pt would like something else for pain. States she has gas and her stomach is still hurting

## 2017-07-27 NOTE — ED Notes (Signed)
Pt back from Xray. Will notify MD of lactic

## 2017-08-03 ENCOUNTER — Encounter: Payer: Self-pay | Admitting: *Deleted

## 2017-08-03 ENCOUNTER — Telehealth: Payer: Self-pay | Admitting: Internal Medicine

## 2017-08-03 NOTE — Telephone Encounter (Signed)
Recall for abdominal ultrasound 

## 2017-08-03 NOTE — Telephone Encounter (Signed)
Recall letter sent to pt.

## 2017-08-22 ENCOUNTER — Other Ambulatory Visit: Payer: Self-pay

## 2017-08-22 DIAGNOSIS — K7460 Unspecified cirrhosis of liver: Secondary | ICD-10-CM

## 2017-08-24 ENCOUNTER — Telehealth: Payer: Self-pay

## 2017-08-24 DIAGNOSIS — K7460 Unspecified cirrhosis of liver: Secondary | ICD-10-CM

## 2017-08-24 NOTE — Telephone Encounter (Signed)
Pt came by office. She received recall letter for [place] to be done in February. [place] abd RUQ scheduled for 09/06/17 at 9:30am, pt to arrive at 9:15am. NPO after midnight prior to test. Letter given to pt.

## 2017-09-06 ENCOUNTER — Ambulatory Visit (HOSPITAL_COMMUNITY)
Admission: RE | Admit: 2017-09-06 | Discharge: 2017-09-06 | Disposition: A | Discharge status: Home / Self Care | Payer: Medicare HMO | Source: Ambulatory Visit | Attending: Gastroenterology | Admitting: Gastroenterology

## 2017-09-06 DIAGNOSIS — K7460 Unspecified cirrhosis of liver: Secondary | ICD-10-CM | POA: Diagnosis not present

## 2017-09-13 NOTE — Progress Notes (Signed)
Stable. Repeat US abdomen in 6 months.

## 2017-09-13 NOTE — Progress Notes (Signed)
Tried to call. Mailbox full. Mailing a letter of stable results and repeat US in 6 months.  Forwarding to [place] to nic.

## 2017-09-14 NOTE — Progress Notes (Signed)
ON RECALL  °

## 2017-09-21 ENCOUNTER — Other Ambulatory Visit: Payer: Self-pay

## 2017-09-21 ENCOUNTER — Encounter (HOSPITAL_COMMUNITY): Payer: Self-pay

## 2017-09-21 ENCOUNTER — Emergency Department (HOSPITAL_COMMUNITY): Payer: Medicare HMO

## 2017-09-21 ENCOUNTER — Emergency Department (HOSPITAL_COMMUNITY)
Admission: EM | Admit: 2017-09-21 | Discharge: 2017-09-21 | Disposition: A | Discharge status: Home / Self Care | Payer: Medicare HMO | Attending: Emergency Medicine | Admitting: Emergency Medicine

## 2017-09-21 DIAGNOSIS — Z96652 Presence of left artificial knee joint: Secondary | ICD-10-CM | POA: Diagnosis not present

## 2017-09-21 DIAGNOSIS — R112 Nausea with vomiting, unspecified: Secondary | ICD-10-CM | POA: Diagnosis not present

## 2017-09-21 DIAGNOSIS — F17210 Nicotine dependence, cigarettes, uncomplicated: Secondary | ICD-10-CM | POA: Insufficient documentation

## 2017-09-21 DIAGNOSIS — J45909 Unspecified asthma, uncomplicated: Secondary | ICD-10-CM | POA: Diagnosis not present

## 2017-09-21 DIAGNOSIS — I10 Essential (primary) hypertension: Secondary | ICD-10-CM | POA: Diagnosis not present

## 2017-09-21 DIAGNOSIS — Z7982 Long term (current) use of aspirin: Secondary | ICD-10-CM | POA: Diagnosis not present

## 2017-09-21 DIAGNOSIS — R197 Diarrhea, unspecified: Secondary | ICD-10-CM | POA: Diagnosis not present

## 2017-09-21 DIAGNOSIS — E860 Dehydration: Secondary | ICD-10-CM | POA: Diagnosis not present

## 2017-09-21 DIAGNOSIS — Z79899 Other long term (current) drug therapy: Secondary | ICD-10-CM | POA: Diagnosis not present

## 2017-09-21 DIAGNOSIS — R109 Unspecified abdominal pain: Secondary | ICD-10-CM | POA: Diagnosis not present

## 2017-09-21 LAB — CBC WITH DIFFERENTIAL/PLATELET
Basophils Absolute: 0 10*3/uL (ref 0.0–0.1)
Basophils Relative: 0 %
Eosinophils Absolute: 0 10*3/uL (ref 0.0–0.7)
Eosinophils Relative: 0 %
HCT: 48.8 % — ABNORMAL HIGH (ref 36.0–46.0)
Hemoglobin: 16.7 g/dL — ABNORMAL HIGH (ref 12.0–15.0)
Lymphocytes Relative: 8 %
Lymphs Abs: 0.8 10*3/uL (ref 0.7–4.0)
MCH: 31.2 pg (ref 26.0–34.0)
MCHC: 34.2 g/dL (ref 30.0–36.0)
MCV: 91.2 fL (ref 78.0–100.0)
Monocytes Absolute: 0.9 10*3/uL (ref 0.1–1.0)
Monocytes Relative: 9 %
Neutro Abs: 8.5 10*3/uL — ABNORMAL HIGH (ref 1.7–7.7)
Neutrophils Relative %: 83 %
Platelets: 167 10*3/uL (ref 150–400)
RBC: 5.35 MIL/uL — ABNORMAL HIGH (ref 3.87–5.11)
RDW: 14.8 % (ref 11.5–15.5)
WBC: 10.2 10*3/uL (ref 4.0–10.5)

## 2017-09-21 LAB — COMPREHENSIVE METABOLIC PANEL
ALT: 18 U/L (ref 14–54)
AST: 22 U/L (ref 15–41)
Albumin: 4.7 g/dL (ref 3.5–5.0)
Alkaline Phosphatase: 67 U/L (ref 38–126)
Anion gap: 14 (ref 5–15)
BUN: 11 mg/dL (ref 6–20)
CO2: 15 mmol/L — ABNORMAL LOW (ref 22–32)
Calcium: 10.9 mg/dL — ABNORMAL HIGH (ref 8.9–10.3)
Chloride: 104 mmol/L (ref 101–111)
Creatinine, Ser: 0.88 mg/dL (ref 0.44–1.00)
GFR calc Af Amer: >60 mL/min (ref >60)
GFR calc non Af Amer: >60 mL/min (ref >60)
Glucose, Bld: 111 mg/dL — ABNORMAL HIGH (ref 65–99)
Potassium: 3.6 mmol/L (ref 3.5–5.1)
Sodium: 133 mmol/L — ABNORMAL LOW (ref 135–145)
Total Bilirubin: 2 mg/dL — ABNORMAL HIGH (ref 0.3–1.2)
Total Protein: 9.3 g/dL — ABNORMAL HIGH (ref 6.5–8.1)

## 2017-09-21 MED ORDER — SODIUM CHLORIDE 0.9 % IV BOLUS (SEPSIS)
500.0000 mL | Freq: Once | INTRAVENOUS | Status: AC
  Administered 2017-09-21: 500 mL via INTRAVENOUS

## 2017-09-21 MED ORDER — SODIUM CHLORIDE 0.9 % IV BOLUS (SEPSIS)
1000.0000 mL | Freq: Once | INTRAVENOUS | Status: AC
  Administered 2017-09-21: 1000 mL via INTRAVENOUS

## 2017-09-21 MED ORDER — TRAMADOL HCL 50 MG PO TABS: 50.0000 mg | ORAL_TABLET | Freq: Four times a day (QID) | ORAL | 0 refills | Status: DC | PRN

## 2017-09-21 MED ORDER — HYDROMORPHONE HCL 1 MG/ML IJ SOLN
0.5000 mg | Freq: Once | INTRAMUSCULAR | Status: AC
  Administered 2017-09-21: 0.5 mg via INTRAVENOUS
  Filled 2017-09-21: qty 1

## 2017-09-21 MED ORDER — ONDANSETRON HCL 4 MG/2ML IJ SOLN
4.0000 mg | Freq: Once | INTRAMUSCULAR | Status: AC
  Administered 2017-09-21: 4 mg via INTRAVENOUS
  Filled 2017-09-21: qty 2

## 2017-09-21 MED ORDER — ONDANSETRON 4 MG PO TBDP: ORAL_TABLET | ORAL | 0 refills | Status: DC

## 2017-09-21 NOTE — ED Notes (Addendum)
Pt is a hard stick for an IV. Last successful attempt at hospital was an ultrasound guided IV

## 2017-09-21 NOTE — ED Notes (Signed)
As this RN was giving

## 2017-09-21 NOTE — ED Notes (Signed)
EDP Zammit made aware that the pt is requesting pain meds

## 2017-09-21 NOTE — ED Triage Notes (Signed)
Pt states she started throwing up yesterday. Pt stated she also has diarrhea. Pt states she has been running a fever, but has not taken her temperature. Pt has thrown up countless times and diarrhea all day yesterday. Pt has lower abdominal pain as well.

## 2017-09-21 NOTE — ED Provider Notes (Signed)
[facility] EMERGENCY DEPARTMENT Provider Note   CSN: [id number] Arrival date & time: 09/21/17  1629     History   Chief Complaint Chief Complaint  Patient presents with  . Emesis    HPI Colleen Lee is a 62 y.o. female.  Patient complains of vomiting diarrhea for last couple days and some abdominal cramping   The history is provided by the patient. No language interpreter was used.  Emesis   This is a new problem. The current episode started 12 to 24 hours ago. The problem occurs 5 to 10 times per day. The problem has not changed since onset.The emesis has an appearance of stomach contents. There has been no fever. Associated symptoms include abdominal pain and diarrhea. Pertinent negatives include no chills, no cough and no headaches. Risk factors include ill contacts.    Past Medical History:  Diagnosis Date  . Asthma   . Chronic back pain   . Cirrhosis ([place])    Metavir score F4 on elastography 2015  . Fibroids   . Fibromyalgia   . GERD (gastroesophageal reflux disease)   . H. pylori infection 2014   treated with pylera, had to be treated again as it was not eradicated. Urea breath test then negative after subsequent treatment.   . [name]    HCV RNA positive 09/2012  . Hypertension   . PONV (postoperative nausea and vomiting)    pt thinks maybe once she had N&V  . Sciatica of left side     Patient Active Problem List   Diagnosis Date Noted  . Gastroenteritis 06/14/2016  . Nausea with vomiting 06/10/2016  . Uterine enlargement 06/09/2016  . Intractable nausea and vomiting 06/08/2016  . Acute infective gastroenteritis 06/08/2016  . Diarrhea 06/08/2016  . Essential hypertension 06/08/2016  . GERD (gastroesophageal reflux disease) 06/08/2016  . Abdominal pain   . Endometrial polyp 04/15/2016  . PMB (postmenopausal bleeding) 02/27/2016  . Alcoholic cirrhosis of liver without ascites ([place])   . Asthma 01/21/2016  . Hepatic cirrhosis ([place]) 09/22/2015  .  Arthritis of knee, degenerative 02/05/2015  . History of Helicobacter pylori infection 10/30/2014  . Chronic [name] with cirrhosis ([place]) 04/11/2014  . De Quervain's disease (radial styloid tenosynovitis) 12/25/2013  . Anorexia 11/21/2012  . FH: colon cancer 11/21/2012  . Early satiety 10/25/2012  . Bowel habit changes 10/25/2012  . Abdominal pain, epigastric 10/25/2012  . Abdominal bloating 10/25/2012  . Constipation 10/25/2012  . Abnormal weight loss 10/25/2012  . Chronic viral [name] ([place]) 10/25/2012  . Radicular pain of left lower extremity 09/28/2012  . Back pain 09/28/2012  . Sciatica 08/10/2011  . S/P arthroscopy of left knee 08/10/2011  . Tibial plateau fracture 08/10/2011  . Pain in joint, lower leg 02/12/2011  . Stiffness of joint, not elsewhere classified, lower leg 02/12/2011  . 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 Meniscus, medial, derangement 12/29/2010  . CLOSED FRACTURE OF UPPER END OF TIBIA 08/12/2010    Past Surgical History:  Procedure Laterality Date  . COLONOSCOPY  01/18/2008   [id number] rectum.  Long redundant colon, a diminutive sigmoid polyp status post cold biopsy removed. Hyperplastic polyp. Repeat colonoscopy June 2014 due to family history of colon cancer  . COLONOSCOPY WITH ESOPHAGOGASTRODUODENOSCOPY (EGD) N/A 11/02/2012   [id number]:[id number] gastric mucosa of doubtful, +H.pylori. Incomplete colonoscopy due to patient unable to tolerate exam, proximal colon seen. Patient refused ACBE.  Marland Kitchen COLONOSCOPY WITH PROPOFOL N/A 03/08/2013   Dr. Gala Romney: colonic polyp-removed as scribed above. Internal  Hemorrhoids. Pathology did not reveal any colonic tissue, only mucus. SURVEILLANCE DUE Aug 2019  . ESOPHAGOGASTRODUODENOSCOPY  01/18/2008   RMR: Normal esophagus, normal  stomach  . ESOPHAGOGASTRODUODENOSCOPY (EGD) WITH PROPOFOL N/A 02/09/2016   Normal esophagus, small hiatal hernia, portal hypertensive gastropathy, normal second portion of duodenum. Due in July 2019    . HYSTEROSCOPY  05/11/2016   Procedure: HYSTEROSCOPY;  Surgeon: Jonnie Kind, MD;  Location: [facility];  Service: Gynecology;;  . KNEE SURGERY     left knee  . MULTIPLE EXTRACTIONS WITH ALVEOLOPLASTY N/A 10/15/2013   Procedure: MULTIPLE EXTRACTION WITH ALVEOLOPLASTY;  Surgeon: Gae Bon, DDS;  Location: [facility];  Service: Oral Surgery;  Laterality: N/A;  . POLYPECTOMY N/A 03/08/2013   Procedure: POLYPECTOMY;  Surgeon: Daneil Dolin, MD;  Location: [facility];  Service: Endoscopy;  Laterality: N/A;  . POLYPECTOMY N/A 05/11/2016   Procedure: REMOVAL OF ENDOMETRIAL POLYP;  Surgeon: Jonnie Kind, MD;  Location: [facility];  Service: Gynecology;  Laterality: N/A;  . TOTAL KNEE ARTHROPLASTY Left 02/05/2015   Procedure: LEFT TOTAL KNEE ARTHROPLASTY;  Surgeon: Carole Civil, MD;  Location: [facility];  Service: Orthopedics;  Laterality: Left;    OB History    Gravida Para Term Preterm AB Living             1   SAB TAB Ectopic Multiple Live Births                   Home Medications    Prior to Admission medications   Medication Sig Start Date End Date Taking? Authorizing Provider  aspirin 325 MG EC tablet Take 1 tablet (325 mg total) by mouth daily. 06/13/16  Yes Sinda Du, MD  budesonide-formoterol [facility]) 160-4.5 MCG/ACT inhaler Inhale 2 puffs into the lungs 2 (two) times daily.   Yes [provider]  Cyanocobalamin (VITAMIN B 12 PO) Take 1 tablet by mouth daily.   Yes [provider]  ergocalciferol (VITAMIN D2) 50000 units capsule Take 50,000 Units by mouth every 30 (thirty) days.   Yes [provider]  lisinopril (PRINIVIL,ZESTRIL) 20 MG tablet Take 20 mg by mouth daily.  09/25/15  Yes [provider]  megestrol (MEGACE) 40 MG tablet Take 40 mg by mouth daily.   Yes [provider]  metoCLOPramide (REGLAN) 10 MG tablet Take 1 tablet (10 mg total) by mouth every 6 (six) hours. 07/27/17  Yes Noemi Chapel, MD  pantoprazole (PROTONIX) 40  MG tablet Take 1 tablet (40 mg total) by mouth daily before breakfast. Patient taking differently: Take 40 mg by mouth as needed.  05/09/17  Yes Mahala Menghini, PA-C  Plecanatide (TRULANCE) 3 MG TABS Take 1 tablet by mouth daily. 05/09/17  Yes Mahala Menghini, PA-C  ondansetron (ZOFRAN ODT) 4 MG disintegrating tablet '4mg'$  ODT q4 hours prn nausea/vomit 09/21/17   Milton Ferguson, MD  traMADol (ULTRAM) 50 MG tablet Take 1 tablet (50 mg total) by mouth every 6 (six) hours as needed. 09/21/17   Milton Ferguson, MD    Family History Family History  Problem Relation Age of Onset  . Colon cancer Brother 7          . Multiple myeloma Brother   . Liver cancer Sister   . Prostate cancer Brother   . Pancreatic cancer Brother   . Cancer Mother        breast  . Asthma Mother     Social History Social History   Tobacco Use  .  Smoking status: Current Every Day Smoker    Packs/day: 0.50    Years: 40.00    Pack years: 20.00    Types: Cigarettes  . Smokeless tobacco: Never Used  . Tobacco comment: Smokes one pack of cigarettes daily  Substance Use Topics  . Alcohol use: No    Alcohol/week: 0.0 oz  . Drug use: No    Comment: history of marijuana in past     Allergies   Penicillins   Review of Systems Review of Systems  Constitutional: Negative for appetite change, chills and fatigue.  HENT: Negative for congestion, ear discharge and sinus pressure.   Eyes: Negative for discharge.  Respiratory: Negative for cough.   Cardiovascular: Negative for chest pain.  Gastrointestinal: Positive for abdominal pain, diarrhea and vomiting.  Genitourinary: Negative for frequency and hematuria.  Musculoskeletal: Negative for back pain.  Skin: Negative for rash.  Neurological: Negative for seizures and headaches.  Psychiatric/Behavioral: Negative for hallucinations.     Physical Exam Updated Vital Signs BP (!) 183/113   Pulse 99   Temp 98.7 F (37.1 C) (Oral)   Resp 18   Ht '5\' 6"'$  (1.676 m)    Wt 88.5 kg (195 lb)   SpO2 100%   BMI 31.47 kg/m   Physical Exam  Constitutional: She is oriented to person, place, and time. She appears well-developed.  HENT:  Head: Normocephalic.  Eyes: Conjunctivae and EOM are normal. No scleral icterus.  Neck: Neck supple. No thyromegaly present.  Cardiovascular: Normal rate and regular rhythm. Exam reveals no gallop and no friction rub.  No murmur heard. Pulmonary/Chest: No stridor. She has no wheezes. She has no rales. She exhibits no tenderness.  Abdominal: She exhibits no distension. There is tenderness. There is no rebound.  Musculoskeletal: Normal range of motion. She exhibits no edema.  Lymphadenopathy:    She has no cervical adenopathy.  Neurological: She is oriented to person, place, and time. She exhibits normal muscle tone. Coordination normal.  Skin: No rash noted. No erythema.  Psychiatric: She has a normal mood and affect. Her behavior is normal.     ED Treatments / Results  Labs (all labs ordered are listed, but only abnormal results are displayed) Labs Reviewed  COMPREHENSIVE METABOLIC PANEL - Abnormal; Notable for the following components:      Result Value   Sodium 133 (*)    CO2 15 (*)    Glucose, Bld 111 (*)    Calcium 10.9 (*)    Total Protein 9.3 (*)    Total Bilirubin 2.0 (*)    All other components within normal limits  CBC WITH DIFFERENTIAL/PLATELET - Abnormal; Notable for the following components:   RBC 5.35 (*)    Hemoglobin 16.7 (*)    HCT 48.8 (*)    Neutro Abs 8.5 (*)    All other components within normal limits    EKG  EKG Interpretation None       Radiology Dg Abd Acute W/chest  Result Date: 09/21/2017 CLINICAL DATA:  Pain with vomiting and diarrhea EXAM: DG ABDOMEN ACUTE W/ 1V CHEST COMPARISON:  07/27/2017, 03/22/2017 FINDINGS: Single-view chest demonstrates mild diffuse chronic appearing bronchitic changes. No acute consolidation or effusion. Normal cardiomediastinal silhouette with  aortic atherosclerosis. No pneumothorax. Supine and upright views of the abdomen demonstrate no free air beneath the diaphragm. Overall nonobstructed bowel gas pattern. Coarse calcifications in the right pelvis consistent with calcified fibroid. IMPRESSION: 1. No radiographic evidence for acute cardiopulmonary abnormality. Bronchitic changes 2. Nonobstructed gas  pattern 3. Calcified uterine fibroid. Electronically Signed   By: Donavan Foil M.D.   On: 09/21/2017 18:20    Procedures Procedures (including critical care time)  Medications Ordered in ED Medications  sodium chloride 0.9 % bolus 1,000 mL (0 mLs Intravenous Stopped 09/21/17 1906)  ondansetron (ZOFRAN) injection 4 mg (4 mg Intravenous Given 09/21/17 1717)  HYDROmorphone (DILAUDID) injection 0.5 mg (0.5 mg Intravenous Given 09/21/17 1906)  ondansetron (ZOFRAN) injection 4 mg (4 mg Intravenous Given 09/21/17 1905)  sodium chloride 0.9 % bolus 500 mL (500 mLs Intravenous New Bag/Given 09/21/17 1906)     Initial Impression / Assessment and Plan / ED Course  I have reviewed the triage vital signs and the nursing notes.  Pertinent labs & imaging results that were available during my care of the patient were reviewed by me and considered in my medical decision making (see chart for details).     Patient improved with fluids nausea and pain medicine.  Suspect gastroenteritis.  She is sent home with Zofran and Ultram and will follow up with her PCP next week  Final Clinical Impressions(s) / ED Diagnoses   Final diagnoses:  Dehydration  Nausea vomiting and diarrhea    ED Discharge Orders        Ordered    ondansetron (ZOFRAN ODT) 4 MG disintegrating tablet     09/21/17 1929    traMADol (ULTRAM) 50 MG tablet  Every 6 hours PRN     09/21/17 1929       Milton Ferguson, MD 09/21/17 1932

## 2017-09-21 NOTE — Discharge Instructions (Signed)
Drink plenty of fluids.  Follow-up with your family doctor next week.  Return to the emergency department if you have problems prior to that

## 2017-09-23 ENCOUNTER — Emergency Department (HOSPITAL_COMMUNITY): Payer: Medicare HMO

## 2017-09-23 ENCOUNTER — Other Ambulatory Visit: Payer: Self-pay

## 2017-09-23 ENCOUNTER — Emergency Department (HOSPITAL_COMMUNITY)
Admission: EM | Admit: 2017-09-23 | Discharge: 2017-09-23 | Disposition: A | Discharge status: Home / Self Care | Payer: Medicare HMO | Attending: Emergency Medicine | Admitting: Emergency Medicine

## 2017-09-23 ENCOUNTER — Encounter (HOSPITAL_COMMUNITY): Payer: Self-pay | Admitting: Emergency Medicine

## 2017-09-23 DIAGNOSIS — I10 Essential (primary) hypertension: Secondary | ICD-10-CM | POA: Insufficient documentation

## 2017-09-23 DIAGNOSIS — J45909 Unspecified asthma, uncomplicated: Secondary | ICD-10-CM | POA: Insufficient documentation

## 2017-09-23 DIAGNOSIS — Z79899 Other long term (current) drug therapy: Secondary | ICD-10-CM | POA: Diagnosis not present

## 2017-09-23 DIAGNOSIS — R112 Nausea with vomiting, unspecified: Secondary | ICD-10-CM | POA: Diagnosis not present

## 2017-09-23 DIAGNOSIS — R197 Diarrhea, unspecified: Secondary | ICD-10-CM | POA: Diagnosis not present

## 2017-09-23 DIAGNOSIS — F17210 Nicotine dependence, cigarettes, uncomplicated: Secondary | ICD-10-CM | POA: Diagnosis not present

## 2017-09-23 DIAGNOSIS — Z7982 Long term (current) use of aspirin: Secondary | ICD-10-CM | POA: Diagnosis not present

## 2017-09-23 DIAGNOSIS — R1084 Generalized abdominal pain: Secondary | ICD-10-CM | POA: Diagnosis not present

## 2017-09-23 DIAGNOSIS — Z96652 Presence of left artificial knee joint: Secondary | ICD-10-CM | POA: Diagnosis not present

## 2017-09-23 DIAGNOSIS — R109 Unspecified abdominal pain: Secondary | ICD-10-CM | POA: Diagnosis not present

## 2017-09-23 DIAGNOSIS — R630 Anorexia: Secondary | ICD-10-CM | POA: Diagnosis not present

## 2017-09-23 LAB — COMPREHENSIVE METABOLIC PANEL
ALT: 16 U/L (ref 14–54)
AST: 16 U/L (ref 15–41)
Albumin: 4.4 g/dL (ref 3.5–5.0)
Alkaline Phosphatase: 62 U/L (ref 38–126)
Anion gap: 11 (ref 5–15)
BUN: 13 mg/dL (ref 6–20)
CO2: 17 mmol/L — ABNORMAL LOW (ref 22–32)
Calcium: 10.2 mg/dL (ref 8.9–10.3)
Chloride: 104 mmol/L (ref 101–111)
Creatinine, Ser: 0.78 mg/dL (ref 0.44–1.00)
GFR calc Af Amer: >60 mL/min (ref >60)
GFR calc non Af Amer: >60 mL/min (ref >60)
Glucose, Bld: 111 mg/dL — ABNORMAL HIGH (ref 65–99)
Potassium: 3.2 mmol/L — ABNORMAL LOW (ref 3.5–5.1)
Sodium: 132 mmol/L — ABNORMAL LOW (ref 135–145)
Total Bilirubin: 1.4 mg/dL — ABNORMAL HIGH (ref 0.3–1.2)
Total Protein: 8.5 g/dL — ABNORMAL HIGH (ref 6.5–8.1)

## 2017-09-23 LAB — CBC WITH DIFFERENTIAL/PLATELET
Basophils Absolute: 0 10*3/uL (ref 0.0–0.1)
Basophils Relative: 0 %
Eosinophils Absolute: 0 10*3/uL (ref 0.0–0.7)
Eosinophils Relative: 0 %
HCT: 47 % — ABNORMAL HIGH (ref 36.0–46.0)
Hemoglobin: 16 g/dL — ABNORMAL HIGH (ref 12.0–15.0)
Lymphocytes Relative: 12 %
Lymphs Abs: 0.7 10*3/uL (ref 0.7–4.0)
MCH: 31 pg (ref 26.0–34.0)
MCHC: 34 g/dL (ref 30.0–36.0)
MCV: 91.1 fL (ref 78.0–100.0)
Monocytes Absolute: 0.3 10*3/uL (ref 0.1–1.0)
Monocytes Relative: 4 %
Neutro Abs: 5.4 10*3/uL (ref 1.7–7.7)
Neutrophils Relative %: 84 %
Platelets: 177 10*3/uL (ref 150–400)
RBC: 5.16 MIL/uL — ABNORMAL HIGH (ref 3.87–5.11)
RDW: 14.2 % (ref 11.5–15.5)
WBC: 6.5 10*3/uL (ref 4.0–10.5)

## 2017-09-23 LAB — LIPASE, BLOOD: Lipase: 25 U/L (ref 11–51)

## 2017-09-23 MED ORDER — MORPHINE SULFATE (PF) 4 MG/ML IV SOLN
4.0000 mg | Freq: Once | INTRAVENOUS | Status: AC
  Administered 2017-09-23: 4 mg via INTRAVENOUS
  Filled 2017-09-23: qty 1

## 2017-09-23 MED ORDER — SODIUM CHLORIDE 0.9 % IV BOLUS (SEPSIS)
1000.0000 mL | Freq: Once | INTRAVENOUS | Status: AC
  Administered 2017-09-23: 1000 mL via INTRAVENOUS

## 2017-09-23 MED ORDER — PROMETHAZINE HCL 25 MG/ML IJ SOLN
12.5000 mg | Freq: Once | INTRAMUSCULAR | Status: AC
  Administered 2017-09-23: 12.5 mg via INTRAVENOUS
  Filled 2017-09-23: qty 1

## 2017-09-23 MED ORDER — PROMETHAZINE HCL 25 MG PO TABS: 25.0000 mg | ORAL_TABLET | Freq: Four times a day (QID) | ORAL | 0 refills | Status: DC | PRN

## 2017-09-23 MED ORDER — FENTANYL CITRATE (PF) 100 MCG/2ML IJ SOLN
50.0000 ug | Freq: Once | INTRAMUSCULAR | Status: AC
  Administered 2017-09-23: 50 ug via INTRAVENOUS
  Filled 2017-09-23: qty 2

## 2017-09-23 MED ORDER — SODIUM CHLORIDE 0.9 % IV BOLUS (SEPSIS)
500.0000 mL | Freq: Once | INTRAVENOUS | Status: AC
  Administered 2017-09-23: 500 mL via INTRAVENOUS

## 2017-09-23 MED ORDER — HYDROMORPHONE HCL 1 MG/ML IJ SOLN
0.5000 mg | Freq: Once | INTRAMUSCULAR | Status: AC
  Administered 2017-09-23: 0.5 mg via INTRAVENOUS
  Filled 2017-09-23: qty 1

## 2017-09-23 NOTE — ED Provider Notes (Signed)
[facility] EMERGENCY DEPARTMENT Provider Note   CSN: [id number] Arrival date & time: 09/23/17  0911     History   Chief Complaint Chief Complaint  Patient presents with  . Emesis  . Abdominal Pain    HPI Colleen Lee is a 62 y.o. female.  HPI  Patient with acute on chronic abdominal pain.  Seen 2 days ago for the same.  Has history of chronic abdominal pain.  Had been doing better with better constipation control.  Recently has been worse.  Nausea vomiting diarrhea.  No fevers.  No blood in the stool or emesis.  States she is been taking her medicines. Past Medical History:  Diagnosis Date  . Asthma   . Chronic back pain   . Cirrhosis ([place])    Metavir score F4 on elastography 2015  . Fibroids   . Fibromyalgia   . GERD (gastroesophageal reflux disease)   . H. pylori infection 2014   treated with pylera, had to be treated again as it was not eradicated. Urea breath test then negative after subsequent treatment.   . [name]    HCV RNA positive 09/2012  . Hypertension   . PONV (postoperative nausea and vomiting)    pt thinks maybe once she had N&V  . Sciatica of left side     Patient Active Problem List   Diagnosis Date Noted  . Gastroenteritis 06/14/2016  . Nausea with vomiting 06/10/2016  . Uterine enlargement 06/09/2016  . Intractable nausea and vomiting 06/08/2016  . Acute infective gastroenteritis 06/08/2016  . Diarrhea 06/08/2016  . Essential hypertension 06/08/2016  . GERD (gastroesophageal reflux disease) 06/08/2016  . Abdominal pain   . Endometrial polyp 04/15/2016  . PMB (postmenopausal bleeding) 02/27/2016  . Alcoholic cirrhosis of liver without ascites ([place])   . Asthma 01/21/2016  . Hepatic cirrhosis ([place]) 09/22/2015  . Arthritis of knee, degenerative 02/05/2015  . History of Helicobacter pylori infection 10/30/2014  . Chronic [name] with cirrhosis ([place]) 04/11/2014  . De Quervain's disease (radial styloid tenosynovitis) 12/25/2013  .  Anorexia 11/21/2012  . FH: colon cancer 11/21/2012  . Early satiety 10/25/2012  . Bowel habit changes 10/25/2012  . Abdominal pain, epigastric 10/25/2012  . Abdominal bloating 10/25/2012  . Constipation 10/25/2012  . Abnormal weight loss 10/25/2012  . Chronic viral [name] ([place]) 10/25/2012  . Radicular pain of left lower extremity 09/28/2012  . Back pain 09/28/2012  . Sciatica 08/10/2011  . S/P arthroscopy of left knee 08/10/2011  . Tibial plateau fracture 08/10/2011  . Pain in joint, lower leg 02/12/2011  . Stiffness of joint, not elsewhere classified, lower leg 02/12/2011  . 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 Meniscus, medial, derangement 12/29/2010  . CLOSED FRACTURE OF UPPER END OF TIBIA 08/12/2010    Past Surgical History:  Procedure Laterality Date  . COLONOSCOPY  01/18/2008   [id number] rectum.  Long redundant colon, a diminutive sigmoid polyp status post cold biopsy removed. Hyperplastic polyp. Repeat colonoscopy June 2014 due to family history of colon cancer  . COLONOSCOPY WITH ESOPHAGOGASTRODUODENOSCOPY (EGD) N/A 11/02/2012   HYI:[id number] gastric mucosa of doubtful, +H.pylori. Incomplete colonoscopy due to patient unable to tolerate exam, proximal colon seen. Patient refused ACBE.  Marland Kitchen COLONOSCOPY WITH PROPOFOL N/A 03/08/2013   Dr. Gala Romney: colonic polyp-removed as scribed above. Internal Hemorrhoids. Pathology did not reveal any colonic tissue, only mucus. SURVEILLANCE DUE Aug 2019  . ESOPHAGOGASTRODUODENOSCOPY  01/18/2008   RMR: Normal esophagus, normal  stomach  . ESOPHAGOGASTRODUODENOSCOPY (EGD) WITH PROPOFOL  N/A 02/09/2016   Normal esophagus, small hiatal hernia, portal hypertensive gastropathy, normal second portion of duodenum. Due in July 2019  . HYSTEROSCOPY  05/11/2016   Procedure: HYSTEROSCOPY;  Surgeon: Jonnie Kind, MD;  Location: [facility];  Service: Gynecology;;  . KNEE SURGERY     left knee  . MULTIPLE EXTRACTIONS WITH ALVEOLOPLASTY N/A 10/15/2013    Procedure: MULTIPLE EXTRACTION WITH ALVEOLOPLASTY;  Surgeon: Gae Bon, DDS;  Location: [facility];  Service: Oral Surgery;  Laterality: N/A;  . POLYPECTOMY N/A 03/08/2013   Procedure: POLYPECTOMY;  Surgeon: Daneil Dolin, MD;  Location: [facility];  Service: Endoscopy;  Laterality: N/A;  . POLYPECTOMY N/A 05/11/2016   Procedure: REMOVAL OF ENDOMETRIAL POLYP;  Surgeon: Jonnie Kind, MD;  Location: [facility];  Service: Gynecology;  Laterality: N/A;  . TOTAL KNEE ARTHROPLASTY Left 02/05/2015   Procedure: LEFT TOTAL KNEE ARTHROPLASTY;  Surgeon: Carole Civil, MD;  Location: [facility];  Service: Orthopedics;  Laterality: Left;    OB History    Gravida Para Term Preterm AB Living             1   SAB TAB Ectopic Multiple Live Births                   Home Medications    Prior to Admission medications   Medication Sig Start Date End Date Taking? Authorizing Provider  aspirin 325 MG EC tablet Take 1 tablet (325 mg total) by mouth daily. 06/13/16  Yes Sinda Du, MD  budesonide-formoterol [facility]) 160-4.5 MCG/ACT inhaler Inhale 2 puffs into the lungs 2 (two) times daily.   Yes [provider]  Cyanocobalamin (VITAMIN B 12 PO) Take 1 tablet by mouth daily.   Yes [provider]  ergocalciferol (VITAMIN D2) 50000 units capsule Take 50,000 Units by mouth every 30 (thirty) days.   Yes [provider]  lisinopril (PRINIVIL,ZESTRIL) 20 MG tablet Take 20 mg by mouth daily.  09/25/15  Yes [provider]  megestrol (MEGACE) 40 MG tablet Take 40 mg by mouth daily.   Yes [provider]  metoCLOPramide (REGLAN) 10 MG tablet Take 1 tablet (10 mg total) by mouth every 6 (six) hours. 07/27/17  Yes Noemi Chapel, MD  pantoprazole (PROTONIX) 40 MG tablet Take 1 tablet (40 mg total) by mouth daily before breakfast. Patient taking differently: Take 40 mg by mouth as needed.  05/09/17  Yes Mahala Menghini, PA-C  Plecanatide (TRULANCE) 3 MG TABS Take 1 tablet by mouth  daily. 05/09/17  Yes Mahala Menghini, PA-C  traMADol (ULTRAM) 50 MG tablet Take 1 tablet (50 mg total) by mouth every 6 (six) hours as needed. 09/21/17  Yes Milton Ferguson, MD  ondansetron (ZOFRAN ODT) 4 MG disintegrating tablet 7m ODT q4 hours prn nausea/vomit Patient not taking: Reported on 09/23/2017 09/21/17   ZMilton Ferguson MD  promethazine (PHENERGAN) 25 MG tablet Take 1 tablet (25 mg total) by mouth every 6 (six) hours as needed for nausea. 09/23/17   PDavonna Belling MD    Family History Family History  Problem Relation Age of Onset  . Colon cancer Brother 545         . Multiple myeloma Brother   . Liver cancer Sister   . Prostate cancer Brother   . Pancreatic cancer Brother   . Cancer Mother        breast  . Asthma Mother     Social History Social History   Tobacco Use  .  Smoking status: Current Every Day Smoker    Packs/day: 0.50    Years: 40.00    Pack years: 20.00    Types: Cigarettes  . Smokeless tobacco: Never Used  . Tobacco comment: Smokes one pack of cigarettes daily  Substance Use Topics  . Alcohol use: No    Alcohol/week: 0.0 oz  . Drug use: No    Comment: history of marijuana in past     Allergies   Penicillins   Review of Systems Review of Systems  Constitutional: Positive for appetite change. Negative for fatigue.  HENT: Negative for congestion.   Eyes: Negative for redness.  Gastrointestinal: Positive for abdominal pain, diarrhea, nausea and vomiting. Negative for constipation.  Genitourinary: Negative for flank pain.  Musculoskeletal: Negative for back pain.  Neurological: Negative for seizures.  Hematological: Negative for adenopathy.  Psychiatric/Behavioral: Negative for behavioral problems.     Physical Exam Updated Vital Signs BP (!) 182/88   Pulse 66   Temp 98.4 F (36.9 C) (Oral)   Resp 19   SpO2 100%   Physical Exam  Constitutional: She appears well-developed.  HENT:  Head: Atraumatic.  Eyes: EOM are normal.    Pulmonary/Chest: Breath sounds normal.  Abdominal: Normal appearance.  Mild diffuse tenderness without rebound or guarding.  Genitourinary:  Genitourinary Comments: No CVA tenderness  Neurological: She is alert.  Skin: Skin is warm. Capillary refill takes less than 2 seconds.     ED Treatments / Results  Labs (all labs ordered are listed, but only abnormal results are displayed) Labs Reviewed  COMPREHENSIVE METABOLIC PANEL - Abnormal; Notable for the following components:      Result Value   Sodium 132 (*)    Potassium 3.2 (*)    CO2 17 (*)    Glucose, Bld 111 (*)    Total Protein 8.5 (*)    Total Bilirubin 1.4 (*)    All other components within normal limits  CBC WITH DIFFERENTIAL/PLATELET - Abnormal; Notable for the following components:   RBC 5.16 (*)    Hemoglobin 16.0 (*)    HCT 47.0 (*)    All other components within normal limits  LIPASE, BLOOD    EKG  EKG Interpretation None       Radiology Dg Abd Acute W/chest  Result Date: 09/21/2017 CLINICAL DATA:  Pain with vomiting and diarrhea EXAM: DG ABDOMEN ACUTE W/ 1V CHEST COMPARISON:  07/27/2017, 03/22/2017 FINDINGS: Single-view chest demonstrates mild diffuse chronic appearing bronchitic changes. No acute consolidation or effusion. Normal cardiomediastinal silhouette with aortic atherosclerosis. No pneumothorax. Supine and upright views of the abdomen demonstrate no free air beneath the diaphragm. Overall nonobstructed bowel gas pattern. Coarse calcifications in the right pelvis consistent with calcified fibroid. IMPRESSION: 1. No radiographic evidence for acute cardiopulmonary abnormality. Bronchitic changes 2. Nonobstructed gas pattern 3. Calcified uterine fibroid. Electronically Signed   By: Donavan Foil M.D.   On: 09/21/2017 18:20    Procedures Procedures (including critical care time)  Medications Ordered in ED Medications  sodium chloride 0.9 % bolus 500 mL (0 mLs Intravenous Stopped 09/23/17 1250)   promethazine (PHENERGAN) injection 12.5 mg (12.5 mg Intravenous Given 09/23/17 1146)  fentaNYL (SUBLIMAZE) injection 50 mcg (50 mcg Intravenous Given 09/23/17 1147)  morphine 4 MG/ML injection 4 mg (4 mg Intravenous Given 09/23/17 1322)  sodium chloride 0.9 % bolus 1,000 mL (1,000 mLs Intravenous New Bag/Given 09/23/17 1433)  HYDROmorphone (DILAUDID) injection 0.5 mg (0.5 mg Intravenous Given 09/23/17 1433)     Initial Impression /  Assessment and Plan / ED Course  I have reviewed the triage vital signs and the nursing notes.  Pertinent labs & imaging results that were available during my care of the patient were reviewed by me and considered in my medical decision making (see chart for details).     Patient with nausea vomiting diarrhea and abdominal pain.  Acute on chronic symptoms.  Lab work is abnormal but appears to be at baseline.  Feels better after treatments and IV fluid.  However requesting more pain meds for home.  Instructed with her chronic abdominal pain have to be managed through her gastroenterologist.  Initially was going to get CT angiography because she has had some abnormality in the past, however patient feels better after the fluids and pain medicines.  Will discharge.  Final Clinical Impressions(s) / ED Diagnoses   Final diagnoses:  Abdominal pain, unspecified abdominal location  Nausea vomiting and diarrhea    ED Discharge Orders        Ordered    promethazine (PHENERGAN) 25 MG tablet  Every 6 hours PRN     09/23/17 1528       Davonna Belling, MD 09/23/17 1545

## 2017-09-23 NOTE — ED Notes (Signed)
Pt given ginger ale.

## 2017-09-23 NOTE — ED Triage Notes (Signed)
Pt seen here 2 days ago for same. Pt c/o n/v/d. Pt dry heaving in triage. C/o all over abd pain. gen weaknes noted.

## 2017-09-28 DIAGNOSIS — I10 Essential (primary) hypertension: Secondary | ICD-10-CM | POA: Diagnosis not present

## 2017-09-28 DIAGNOSIS — J449 Chronic obstructive pulmonary disease, unspecified: Secondary | ICD-10-CM | POA: Diagnosis not present

## 2017-09-28 DIAGNOSIS — M549 Dorsalgia, unspecified: Secondary | ICD-10-CM | POA: Diagnosis not present

## 2017-09-28 DIAGNOSIS — K219 Gastro-esophageal reflux disease without esophagitis: Secondary | ICD-10-CM | POA: Diagnosis not present

## 2017-11-02 DIAGNOSIS — M461 Sacroiliitis, not elsewhere classified: Secondary | ICD-10-CM | POA: Diagnosis not present

## 2017-11-02 DIAGNOSIS — M545 Low back pain: Secondary | ICD-10-CM | POA: Diagnosis not present

## 2017-11-02 DIAGNOSIS — M549 Dorsalgia, unspecified: Secondary | ICD-10-CM | POA: Diagnosis not present

## 2017-11-02 DIAGNOSIS — Z79891 Long term (current) use of opiate analgesic: Secondary | ICD-10-CM | POA: Diagnosis not present

## 2017-11-02 DIAGNOSIS — M13862 Other specified arthritis, left knee: Secondary | ICD-10-CM | POA: Diagnosis not present

## 2017-11-02 DIAGNOSIS — M25562 Pain in left knee: Secondary | ICD-10-CM | POA: Diagnosis not present

## 2017-11-02 DIAGNOSIS — I10 Essential (primary) hypertension: Secondary | ICD-10-CM | POA: Diagnosis not present

## 2017-11-02 DIAGNOSIS — G894 Chronic pain syndrome: Secondary | ICD-10-CM | POA: Diagnosis not present

## 2017-11-13 ENCOUNTER — Emergency Department (HOSPITAL_COMMUNITY)
Admission: EM | Admit: 2017-11-13 | Discharge: 2017-11-13 | Disposition: A | Discharge status: Home / Self Care | Payer: Medicare HMO | Attending: Emergency Medicine | Admitting: Emergency Medicine

## 2017-11-13 ENCOUNTER — Emergency Department (HOSPITAL_COMMUNITY): Payer: Medicare HMO

## 2017-11-13 ENCOUNTER — Other Ambulatory Visit: Payer: Self-pay

## 2017-11-13 ENCOUNTER — Encounter (HOSPITAL_COMMUNITY): Payer: Self-pay | Admitting: Emergency Medicine

## 2017-11-13 DIAGNOSIS — F17210 Nicotine dependence, cigarettes, uncomplicated: Secondary | ICD-10-CM | POA: Insufficient documentation

## 2017-11-13 DIAGNOSIS — R1084 Generalized abdominal pain: Secondary | ICD-10-CM | POA: Insufficient documentation

## 2017-11-13 DIAGNOSIS — G8929 Other chronic pain: Secondary | ICD-10-CM

## 2017-11-13 DIAGNOSIS — Z7982 Long term (current) use of aspirin: Secondary | ICD-10-CM | POA: Insufficient documentation

## 2017-11-13 DIAGNOSIS — R1115 Cyclical vomiting syndrome unrelated to migraine: Secondary | ICD-10-CM

## 2017-11-13 DIAGNOSIS — R109 Unspecified abdominal pain: Secondary | ICD-10-CM

## 2017-11-13 DIAGNOSIS — R200 Anesthesia of skin: Secondary | ICD-10-CM | POA: Diagnosis not present

## 2017-11-13 DIAGNOSIS — J45909 Unspecified asthma, uncomplicated: Secondary | ICD-10-CM | POA: Diagnosis not present

## 2017-11-13 DIAGNOSIS — G43A0 Cyclical vomiting, not intractable: Secondary | ICD-10-CM | POA: Diagnosis not present

## 2017-11-13 DIAGNOSIS — I10 Essential (primary) hypertension: Secondary | ICD-10-CM | POA: Diagnosis not present

## 2017-11-13 DIAGNOSIS — Z79899 Other long term (current) drug therapy: Secondary | ICD-10-CM | POA: Diagnosis not present

## 2017-11-13 DIAGNOSIS — R112 Nausea with vomiting, unspecified: Secondary | ICD-10-CM | POA: Diagnosis not present

## 2017-11-13 HISTORY — DX: Other chronic pain: G89.29

## 2017-11-13 HISTORY — DX: Nausea with vomiting, unspecified: R11.2

## 2017-11-13 HISTORY — DX: Unspecified abdominal pain: R10.9

## 2017-11-13 HISTORY — DX: Cannabis use, unspecified, uncomplicated: F12.90

## 2017-11-13 HISTORY — DX: Other constipation: K59.09

## 2017-11-13 HISTORY — DX: Cyclical vomiting syndrome unrelated to migraine: R11.15

## 2017-11-13 LAB — COMPREHENSIVE METABOLIC PANEL
ALT: 22 U/L (ref 14–54)
AST: 20 U/L (ref 15–41)
Albumin: 4.6 g/dL (ref 3.5–5.0)
Alkaline Phosphatase: 61 U/L (ref 38–126)
Anion gap: 12 (ref 5–15)
BUN: 9 mg/dL (ref 6–20)
CO2: 21 mmol/L — ABNORMAL LOW (ref 22–32)
Calcium: 10.9 mg/dL — ABNORMAL HIGH (ref 8.9–10.3)
Chloride: 103 mmol/L (ref 101–111)
Creatinine, Ser: 0.81 mg/dL (ref 0.44–1.00)
GFR calc Af Amer: >60 mL/min (ref >60)
GFR calc non Af Amer: >60 mL/min (ref >60)
Glucose, Bld: 122 mg/dL — ABNORMAL HIGH (ref 65–99)
Potassium: 3.7 mmol/L (ref 3.5–5.1)
Sodium: 136 mmol/L (ref 135–145)
Total Bilirubin: 1.6 mg/dL — ABNORMAL HIGH (ref 0.3–1.2)
Total Protein: 8.6 g/dL — ABNORMAL HIGH (ref 6.5–8.1)

## 2017-11-13 LAB — CBC WITH DIFFERENTIAL/PLATELET
Basophils Absolute: 0 10*3/uL (ref 0.0–0.1)
Basophils Relative: 0 %
Eosinophils Absolute: 0 10*3/uL (ref 0.0–0.7)
Eosinophils Relative: 0 %
HCT: 45.6 % (ref 36.0–46.0)
Hemoglobin: 15.5 g/dL — ABNORMAL HIGH (ref 12.0–15.0)
Lymphocytes Relative: 6 %
Lymphs Abs: 0.6 10*3/uL — ABNORMAL LOW (ref 0.7–4.0)
MCH: 31.1 pg (ref 26.0–34.0)
MCHC: 34 g/dL (ref 30.0–36.0)
MCV: 91.4 fL (ref 78.0–100.0)
Monocytes Absolute: 0.3 10*3/uL (ref 0.1–1.0)
Monocytes Relative: 3 %
Neutro Abs: 8.5 10*3/uL — ABNORMAL HIGH (ref 1.7–7.7)
Neutrophils Relative %: 91 %
Platelets: 195 10*3/uL (ref 150–400)
RBC: 4.99 MIL/uL (ref 3.87–5.11)
RDW: 13.5 % (ref 11.5–15.5)
WBC: 9.5 10*3/uL (ref 4.0–10.5)

## 2017-11-13 LAB — TROPONIN I: Troponin I: <0.03 ng/mL (ref <0.03)

## 2017-11-13 LAB — LIPASE, BLOOD: Lipase: 22 U/L (ref 11–51)

## 2017-11-13 MED ORDER — ONDANSETRON 4 MG PO TBDP
4.0000 mg | ORAL_TABLET | Freq: Once | ORAL | Status: AC
  Administered 2017-11-13: 4 mg via ORAL
  Filled 2017-11-13: qty 1

## 2017-11-13 MED ORDER — DIPHENHYDRAMINE HCL 50 MG/ML IJ SOLN
50.0000 mg | Freq: Once | INTRAMUSCULAR | Status: DC
  Filled 2017-11-13: qty 1

## 2017-11-13 MED ORDER — HYDROCODONE-ACETAMINOPHEN 5-325 MG PO TABS
1.0000 | ORAL_TABLET | Freq: Once | ORAL | Status: AC
  Administered 2017-11-13: 1 via ORAL
  Filled 2017-11-13: qty 1

## 2017-11-13 MED ORDER — FAMOTIDINE 20 MG PO TABS
40.0000 mg | ORAL_TABLET | Freq: Once | ORAL | Status: AC
  Administered 2017-11-13: 40 mg via ORAL
  Filled 2017-11-13: qty 2

## 2017-11-13 MED ORDER — METOCLOPRAMIDE HCL 10 MG PO TABS: 10.0000 mg | ORAL_TABLET | Freq: Four times a day (QID) | ORAL | 0 refills | Status: DC | PRN

## 2017-11-13 MED ORDER — FAMOTIDINE IN NACL 20-0.9 MG/50ML-% IV SOLN
20.0000 mg | Freq: Once | INTRAVENOUS | Status: DC
  Filled 2017-11-13: qty 50

## 2017-11-13 MED ORDER — HALOPERIDOL LACTATE 5 MG/ML IJ SOLN
5.0000 mg | Freq: Once | INTRAMUSCULAR | Status: AC
  Administered 2017-11-13: 5 mg via INTRAMUSCULAR
  Filled 2017-11-13: qty 1

## 2017-11-13 MED ORDER — METOCLOPRAMIDE HCL 5 MG/ML IJ SOLN: 10.0000 mg | Freq: Once | INTRAMUSCULAR | Status: DC

## 2017-11-13 MED ORDER — METOCLOPRAMIDE HCL 5 MG/ML IJ SOLN
10.0000 mg | Freq: Once | INTRAMUSCULAR | Status: AC
  Administered 2017-11-13: 10 mg via INTRAVENOUS
  Filled 2017-11-13: qty 2

## 2017-11-13 MED ORDER — DIPHENHYDRAMINE HCL 50 MG/ML IJ SOLN
50.0000 mg | Freq: Once | INTRAMUSCULAR | Status: AC
  Administered 2017-11-13: 50 mg via INTRAMUSCULAR
  Filled 2017-11-13: qty 1

## 2017-11-13 MED ORDER — SODIUM CHLORIDE 0.9 % IV BOLUS
1000.0000 mL | Freq: Once | INTRAVENOUS | Status: AC
  Administered 2017-11-13: 1000 mL via INTRAVENOUS

## 2017-11-13 NOTE — ED Provider Notes (Signed)
[facility] EMERGENCY DEPARTMENT Provider Note   CSN: [id number] Arrival date & time: 11/13/17  1124     History   Chief Complaint Chief Complaint  Patient presents with  . Emesis  . Numbness    L arm x 2 days    HPI Colleen Lee is a 62 y.o. female.  HPI Pt was seen at 1325.  Per pt, c/o gradual onset and persistence of constant acute flair of her chronic generalized abd "pain" for the past 2 days. Has been associated with multiple intermittent episodes of N/V.  Describes the abd pain as "cramping."  Denies diarrhea, no fevers, no back pain, no rash, no CP/SOB, no black or blood in stools or emesis. The symptoms have been associated with no other complaints. The patient has a significant history of similar symptoms previously, recently being evaluated for this complaint and multiple prior evals for same.     Past Medical History:  Diagnosis Date  . Asthma   . Chronic abdominal pain   . Chronic back pain   . Chronic constipation   . Cirrhosis ([place])    Metavir score F4 on elastography 2015  . Cyclical vomiting   . Fibroids   . Fibromyalgia   . GERD (gastroesophageal reflux disease)   . H. pylori infection 2014   treated with pylera, had to be treated again as it was not eradicated. Urea breath test then negative after subsequent treatment.   . [name]    HCV RNA positive 09/2012  . Hypertension   . Marijuana use   . Nausea and vomiting    chronic, recurrent  . PONV (postoperative nausea and vomiting)    pt thinks maybe once she had N&V  . Sciatica of left side     Patient Active Problem List   Diagnosis Date Noted  . Gastroenteritis 06/14/2016  . Nausea with vomiting 06/10/2016  . Uterine enlargement 06/09/2016  . Intractable nausea and vomiting 06/08/2016  . Acute infective gastroenteritis 06/08/2016  . Diarrhea 06/08/2016  . Essential hypertension 06/08/2016  . GERD (gastroesophageal reflux disease) 06/08/2016  . Abdominal pain   . Endometrial polyp  04/15/2016  . PMB (postmenopausal bleeding) 02/27/2016  . Alcoholic cirrhosis of liver without ascites ([place])   . Asthma 01/21/2016  . Hepatic cirrhosis ([place]) 09/22/2015  . Arthritis of knee, degenerative 02/05/2015  . History of Helicobacter pylori infection 10/30/2014  . Chronic [name] with cirrhosis ([place]) 04/11/2014  . De Quervain's disease (radial styloid tenosynovitis) 12/25/2013  . Anorexia 11/21/2012  . FH: colon cancer 11/21/2012  . Early satiety 10/25/2012  . Bowel habit changes 10/25/2012  . Abdominal pain, epigastric 10/25/2012  . Abdominal bloating 10/25/2012  . Constipation 10/25/2012  . Abnormal weight loss 10/25/2012  . Chronic viral [name] ([place]) 10/25/2012  . Radicular pain of left lower extremity 09/28/2012  . Back pain 09/28/2012  . Sciatica 08/10/2011  . S/P arthroscopy of left knee 08/10/2011  . Tibial plateau fracture 08/10/2011  . Pain in joint, lower leg 02/12/2011  . Stiffness of joint, not elsewhere classified, lower leg 02/12/2011  . 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 Meniscus, medial, derangement 12/29/2010  . CLOSED FRACTURE OF UPPER END OF TIBIA 08/12/2010    Past Surgical History:  Procedure Laterality Date  . COLONOSCOPY  01/18/2008   [facility]:CWCBJS rectum.  Long redundant colon, a diminutive sigmoid polyp status post cold biopsy removed. Hyperplastic polyp. Repeat colonoscopy June 2014 due to family history of colon cancer  . COLONOSCOPY WITH  ESOPHAGOGASTRODUODENOSCOPY (EGD) N/A 11/02/2012   [id number] gastric mucosa of doubtful, +H.pylori. Incomplete colonoscopy due to patient unable to tolerate exam, proximal colon seen. Patient refused ACBE.  Marland Kitchen COLONOSCOPY WITH PROPOFOL N/A 03/08/2013   Dr. Gala Romney: colonic polyp-removed as scribed above. Internal Hemorrhoids. Pathology did not reveal any colonic tissue, only mucus. SURVEILLANCE DUE Aug 2019  . ESOPHAGOGASTRODUODENOSCOPY  01/18/2008   RMR: Normal esophagus, normal  stomach  .  ESOPHAGOGASTRODUODENOSCOPY (EGD) WITH PROPOFOL N/A 02/09/2016   Normal esophagus, small hiatal hernia, portal hypertensive gastropathy, normal second portion of duodenum. Due in July 2019  . HYSTEROSCOPY  05/11/2016   Procedure: HYSTEROSCOPY;  Surgeon: Jonnie Kind, MD;  Location: [facility];  Service: Gynecology;;  . KNEE SURGERY     left knee  . MULTIPLE EXTRACTIONS WITH ALVEOLOPLASTY N/A 10/15/2013   Procedure: MULTIPLE EXTRACTION WITH ALVEOLOPLASTY;  Surgeon: Gae Bon, DDS;  Location: [facility];  Service: Oral Surgery;  Laterality: N/A;  . POLYPECTOMY N/A 03/08/2013   Procedure: POLYPECTOMY;  Surgeon: Daneil Dolin, MD;  Location: [facility];  Service: Endoscopy;  Laterality: N/A;  . POLYPECTOMY N/A 05/11/2016   Procedure: REMOVAL OF ENDOMETRIAL POLYP;  Surgeon: Jonnie Kind, MD;  Location: [facility];  Service: Gynecology;  Laterality: N/A;  . TOTAL KNEE ARTHROPLASTY Left 02/05/2015   Procedure: LEFT TOTAL KNEE ARTHROPLASTY;  Surgeon: Carole Civil, MD;  Location: [facility];  Service: Orthopedics;  Laterality: Left;     OB History    Gravida      Para      Term      Preterm      AB      Living  1     SAB      TAB      Ectopic      Multiple      Live Births               Home Medications    Prior to Admission medications   Medication Sig Start Date End Date Taking? Authorizing Provider  aspirin 325 MG EC tablet Take 1 tablet (325 mg total) by mouth daily. 06/13/16  Yes Sinda Du, MD  budesonide-formoterol [facility]) 160-4.5 MCG/ACT inhaler Inhale 2 puffs into the lungs 2 (two) times daily.   Yes [provider]  Cyanocobalamin (VITAMIN B 12 PO) Take 1 tablet by mouth daily.   Yes [provider]  DULoxetine (CYMBALTA) 30 MG capsule Take 1 capsule by mouth 2 (two) times daily. 11/02/17  Yes [provider]  ergocalciferol (VITAMIN D2) 50000 units capsule Take 50,000 Units by mouth every 30 (thirty) days.   Yes [provider]    HYDROcodone-acetaminophen (NORCO/VICODIN) 5-325 MG tablet Take 1 tablet by mouth every 6 (six) hours. 11/02/17  Yes [provider]  lisinopril (PRINIVIL,ZESTRIL) 20 MG tablet Take 20 mg by mouth daily.  09/25/15  Yes [provider]  megestrol (MEGACE) 40 MG tablet Take 40 mg by mouth daily.   Yes [provider]  ondansetron (ZOFRAN ODT) 4 MG disintegrating tablet '4mg'$  ODT q4 hours prn nausea/vomit 09/21/17  Yes Milton Ferguson, MD  pantoprazole (PROTONIX) 40 MG tablet Take 1 tablet (40 mg total) by mouth daily before breakfast. Patient taking differently: Take 40 mg by mouth as needed.  05/09/17  Yes Mahala Menghini, PA-C  Plecanatide (TRULANCE) 3 MG TABS Take 1 tablet by mouth daily. 05/09/17  Yes Mahala Menghini, PA-C  promethazine (PHENERGAN) 25 MG tablet Take 1 tablet (25  mg total) by mouth every 6 (six) hours as needed for nausea. 09/23/17  Yes Davonna Belling, MD  traMADol (ULTRAM) 50 MG tablet Take 1 tablet (50 mg total) by mouth every 6 (six) hours as needed. 09/21/17  Yes Milton Ferguson, MD  metoCLOPramide (REGLAN) 10 MG tablet Take 1 tablet (10 mg total) by mouth every 6 (six) hours as needed for nausea, vomiting or refractory nausea / vomiting. 11/13/17   Francine Graven, DO    Family History Family History  Problem Relation Age of Onset  . Colon cancer Brother 16          . Multiple myeloma Brother   . Liver cancer Sister   . Prostate cancer Brother   . Pancreatic cancer Brother   . Cancer Mother        breast  . Asthma Mother     Social History Social History   Tobacco Use  . Smoking status: Current Every Day Smoker    Packs/day: 0.50    Years: 40.00    Pack years: 20.00    Types: Cigarettes  . Smokeless tobacco: Never Used  . Tobacco comment: Smokes one pack of cigarettes daily  Substance Use Topics  . Alcohol use: No    Alcohol/week: 0.0 oz  . Drug use: No    Types: Marijuana    Comment: history of marijuana in past     Allergies    Penicillins   Review of Systems Review of Systems ROS: Statement: All systems negative except as marked or noted in the HPI; Constitutional: Negative for fever and chills. ; ; Eyes: Negative for eye pain, redness and discharge. ; ; ENMT: Negative for ear pain, hoarseness, nasal congestion, sinus pressure and sore throat. ; ; Cardiovascular: Negative for chest pain, palpitations, diaphoresis, dyspnea and peripheral edema. ; ; Respiratory: Negative for cough, wheezing and stridor. ; ; Gastrointestinal: +N/V, chronic abd pain. Negative for diarrhea, blood in stool, hematemesis, jaundice and rectal bleeding. . ; ; Genitourinary: Negative for dysuria, flank pain and hematuria. ; ; Musculoskeletal: Negative for back pain and neck pain. Negative for swelling and trauma.; ; Skin: Negative for pruritus, rash, abrasions, blisters, bruising and skin lesion.; ; Neuro: Negative for headache, lightheadedness and neck stiffness. Negative for weakness, altered level of consciousness, altered mental status, extremity weakness, paresthesias, involuntary movement, seizure and syncope.      Physical Exam Updated Vital Signs BP (!) 201/104 Comment: RN Mardee Postin informed of BP  Pulse 82   Temp 98.2 F (36.8 C) (Oral)   Resp 18   Ht '5\' 6"'$  (1.676 m)   Wt 77.1 kg (170 lb)   SpO2 98%   BMI 27.44 kg/m   Physical Exam 1330: Physical examination:  Nursing notes reviewed; Vital signs and O2 SAT reviewed;  Constitutional: Well developed, Well nourished, Well hydrated, In no acute distress; Head:  Normocephalic, atraumatic; Eyes: EOMI, PERRL, No scleral icterus; ENMT: Mouth and pharynx normal, Mucous membranes moist; Neck: Supple, Full range of motion, No lymphadenopathy; Cardiovascular: Regular rate and rhythm, No gallop; Respiratory: Breath sounds clear & equal bilaterally, No wheezes.  Speaking full sentences with ease, Normal respiratory effort/excursion; Chest: Nontender, Movement normal; Abdomen: Soft, +mild  diffuse tenderness to palp. Nondistended, Normal bowel sounds; Genitourinary: No CVA tenderness; Extremities: Peripheral pulses normal, No tenderness, No edema, No calf edema or asymmetry.; Neuro: AA&Ox3, Major CN grossly intact.  Speech clear. No gross focal motor or sensory deficits in extremities.; Skin: Color normal, Warm, Dry.    ED  Treatments / Results  Labs (all labs ordered are listed, but only abnormal results are displayed)   EKG EKG Interpretation  Date/Time:  Sunday November 13 2017 11:50:01 EDT Ventricular Rate:  66 PR Interval:    QRS Duration: 91 QT Interval:  415 QTC Calculation: 435 R Axis:   68 Text Interpretation:  Sinus rhythm Consider left ventricular hypertrophy Anterior Q waves, possibly due to LVH When compared with ECG of 06/10/2016 No significant change was found Confirmed by Francine Graven [phone]) on 11/13/2017 2:07:54 PM   Radiology   Procedures Procedures (including critical care time)  Medications Ordered in ED Medications  HYDROcodone-acetaminophen (NORCO/VICODIN) 5-325 MG per tablet 1 tablet (has no administration in time range)  ondansetron (ZOFRAN-ODT) disintegrating tablet 4 mg (4 mg Oral Given 11/13/17 1246)  sodium chloride 0.9 % bolus 1,000 mL (0 mLs Intravenous Stopped 11/13/17 1354)  metoCLOPramide (REGLAN) injection 10 mg (10 mg Intravenous Given 11/13/17 1350)  diphenhydrAMINE (BENADRYL) injection 50 mg (50 mg Intramuscular Given 11/13/17 1418)  famotidine (PEPCID) tablet 40 mg (40 mg Oral Given 11/13/17 1418)     Initial Impression / Assessment and Plan / ED Course  I have reviewed the triage vital signs and the nursing notes.  Pertinent labs & imaging results that were available during my care of the patient were reviewed by me and considered in my medical decision making (see chart for details).  MDM Reviewed: previous chart, nursing note and vitals Reviewed previous: labs and x-ray Interpretation: labs   Results for orders  placed or performed during the hospital encounter of 11/13/17  Comprehensive metabolic panel  Result Value Ref Range   Sodium 136 135 - 145 mmol/L   Potassium 3.7 3.5 - 5.1 mmol/L   Chloride 103 101 - 111 mmol/L   CO2 21 (L) 22 - 32 mmol/L   Glucose, Bld 122 (H) 65 - 99 mg/dL   BUN 9 6 - 20 mg/dL   Creatinine, Ser 0.81 0.44 - 1.00 mg/dL   Calcium 10.9 (H) 8.9 - 10.3 mg/dL   Total Protein 8.6 (H) 6.5 - 8.1 g/dL   Albumin 4.6 3.5 - 5.0 g/dL   AST 20 15 - 41 U/L   ALT 22 14 - 54 U/L   Alkaline Phosphatase 61 38 - 126 U/L   Total Bilirubin 1.6 (H) 0.3 - 1.2 mg/dL   GFR calc non Af Amer >60 >60 mL/min   GFR calc Af Amer >60 >60 mL/min   Anion gap 12 5 - 15  Lipase, blood  Result Value Ref Range   Lipase 22 11 - 51 U/L  CBC with Differential  Result Value Ref Range   WBC 9.5 4.0 - 10.5 K/uL   RBC 4.99 3.87 - 5.11 MIL/uL   Hemoglobin 15.5 (H) 12.0 - 15.0 g/dL   HCT 45.6 36.0 - 46.0 %   MCV 91.4 78.0 - 100.0 fL   MCH 31.1 26.0 - 34.0 pg   MCHC 34.0 30.0 - 36.0 g/dL   RDW 13.5 11.5 - 15.5 %   Platelets 195 150 - 400 K/uL   Neutrophils Relative % 91 %   Neutro Abs 8.5 (H) 1.7 - 7.7 K/uL   Lymphocytes Relative 6 %   Lymphs Abs 0.6 (L) 0.7 - 4.0 K/uL   Monocytes Relative 3 %   Monocytes Absolute 0.3 0.1 - 1.0 K/uL   Eosinophils Relative 0 %   Eosinophils Absolute 0.0 0.0 - 0.7 K/uL   Basophils Relative 0 %   Basophils Absolute  0.0 0.0 - 0.1 K/uL  Troponin I  Result Value Ref Range   Troponin I <0.03 <0.03 ng/mL    Dg Abd Acute W/chest Result Date: 11/13/2017 CLINICAL DATA:  Nausea and vomiting.  History of hepatitis-C EXAM: DG ABDOMEN ACUTE W/ 1V CHEST COMPARISON:  September 21, 2017 FINDINGS: There is mild elevation of the left hemidiaphragm. There is no edema or consolidation. The heart is upper normal in size with pulmonary vascularity normal. No adenopathy. Supine and upright abdomen: There is moderate air in the stomach. There is no bowel dilatation or air-fluid level to  suggest bowel obstruction. No free air. There are vascular calcifications in the pelvis. There are also calcifications in the right pelvis which represent uterine leiomyomas. IMPRESSION: No bowel obstruction or free air. Calcified uterine leiomyomas rightward aspect of pelvis. No edema or consolidation. Electronically Signed   By: Lowella Grip III M.D.   On: 11/13/2017 15:53    1335:  Pt continually asking multiple staff for narcotic pain medication. Pt with multiple ED visits for chronic abd pain, recurrent N/V, with hx marijuana use, chronic abd pain, chronic constipation, cyclic vomiting, and gastroparesis. Narcotics are not indicated via medical literature for any of these conditions. Pt and family very upset and yelling at me regarding pt not specifically receiving narcotics for her chronic pain. I informed pt that I am treating her pain, and she has been ordered specific abd pain/nausea medications, but that narcotics are not indicated. Pt's family stood up from chair in exam room and moved towards me stating with raised voice that "she always gets her pain treated that way!"  Pt is refusing multiple orders now (AXR and Urine specifically). Concerned regarding this behavior (specifically asking for narcotics). [place] PMP Database accessed: pt has 29 narcotic rx, written by 7 providers and filled at 2 different pharmacies; most recently pt received hydrocodone 5/[facility] '325mg'$ , #20 tabs, on 11/02/2017 (meant to last 20 days).   1500:  Pt continues to frequently request narcotics. She has tol PO well without N/V. No stooling while in the ED. Pt continues to refuse AXR and to give urine sample. Wants to leave now.  Concerned regarding drug seeking behavior.    1520:  Pt's family in the hallway complaining loudly that pt "wasn't getting an XR." ED RN, Agricultural consultant and I all explained that pt had refused the XR earlier. Pt's family states that was "not true" and that pt "just wanted medicines for nausea first."  Explained to family that pt had already received medication for nausea before the XR Tech came to get her for the study, and that pt refused the study despite having already received anti-emetic.  Family states they will leave, but "want an XR before she goes." AXR re-ordered and I also ordered IM haldol and PO norco (pt's home med). Explained to multiple family members again that pt will likely need outpatient specialist care for her chronic recurrent abd pain and cyclical N/V. Family verb understanding and are willing to take the pt home "after she gets all that."   1555:  AXR as above (reassuring). Pt's family (particularly pt's sister) continues argumentative regarding pt not receiving narcotics and "being in so much pain." Reminded that pt received her usual norco dose.  Pt has now been sleeping on and off after all her meds received, has not had N/V and tol PO while in the ED with reassuring workup. Still is requesting narcotics and refuses to given UA. Pt's family state they will  leave now and pt will take her usual BP meds when she gets home.     Final Clinical Impressions(s) / ED Diagnoses        Francine Graven, DO 11/16/17 1743

## 2017-11-13 NOTE — ED Notes (Signed)
Pt informed of need for urine sample. Pt states she cannot provide sample at this time but was informed to let nursing staff know when able to provide sample

## 2017-11-13 NOTE — ED Notes (Signed)
Patient she needs something for pain, and it is not being addressed. [place] notified.

## 2017-11-13 NOTE — ED Notes (Signed)
Pt now states she is not being treated fairly because she did not get her xray.  Confronted pt about this and she states she never refused.  Pt did in fact refuse to Kim from xray.

## 2017-11-13 NOTE — ED Notes (Signed)
Dr. Thurnell Garbe at bedside with myself explaining findings and reassessing patients.

## 2017-11-13 NOTE — ED Notes (Signed)
Patient was asked to provide a urine sample and she stated that she was not able to void at this time. Second request.

## 2017-11-13 NOTE — ED Notes (Signed)
Patient was able to tolerate fluids.

## 2017-11-13 NOTE — ED Notes (Signed)
Pt given water per fluid challenge. Pt able to tolerate PO challenge but pt continues to state that she cannot provide urine sample

## 2017-11-13 NOTE — ED Notes (Signed)
Pt refusing to go to xray at this time.  Pt is upset she is not receiving narcotic pain medications.

## 2017-11-13 NOTE — Discharge Instructions (Addendum)
Take the prescription as directed.  Increase your fluid intake (ie:  Gatoraide) for the next few days.  Eat a bland diet and advance to your regular diet slowly as you can tolerate it. Call your GI doctor tomorrow to confirm your previously scheduled follow up appointment in the next 2 days. Call your regular medical doctor tomorrow to schedule a follow up appointment this week.  Return to the Emergency Department sooner if worsening.

## 2017-11-13 NOTE — ED Triage Notes (Signed)
Pt complains of numbness to L arm x 2 days  Also with N/V started 2 days

## 2017-11-13 NOTE — ED Notes (Signed)
Pt very drowsy, but insists her pain is unchanged.  No dry heaves or vomiting noted.  Offered to help pt get dressed and sister stated she would help her.

## 2017-11-13 NOTE — ED Notes (Signed)
Asked patient for a urine sample patient denies being able to void.

## 2017-11-15 ENCOUNTER — Encounter: Payer: Self-pay | Admitting: *Deleted

## 2017-11-15 ENCOUNTER — Encounter: Payer: Self-pay | Admitting: Internal Medicine

## 2017-11-15 ENCOUNTER — Ambulatory Visit (INDEPENDENT_AMBULATORY_CARE_PROVIDER_SITE_OTHER): Payer: Medicare HMO | Admitting: Internal Medicine

## 2017-11-15 ENCOUNTER — Other Ambulatory Visit: Payer: Self-pay | Admitting: *Deleted

## 2017-11-15 VITALS — BP 94/64 | HR 89 | Temp 98.3°F | Ht 66.0 in | Wt 174.0 lb

## 2017-11-15 DIAGNOSIS — Z86010 Personal history of colonic polyps: Secondary | ICD-10-CM

## 2017-11-15 DIAGNOSIS — R634 Abnormal weight loss: Secondary | ICD-10-CM

## 2017-11-15 DIAGNOSIS — R1084 Generalized abdominal pain: Secondary | ICD-10-CM | POA: Diagnosis not present

## 2017-11-15 DIAGNOSIS — K7469 Other cirrhosis of liver: Secondary | ICD-10-CM

## 2017-11-15 NOTE — Progress Notes (Signed)
Primary Care Physician:  Rosita Fire, MD Primary Gastroenterologist:  Dr. Gala Romney  Pre-Procedure History & Physical: HPI:  Colleen Lee is a 62 y.o. female here for follow-up of chronic abdominal pain., Constipation History of HCV/EtOH cirrhosis. HCV -  SVR-documented. Chronic constipation. CTA demonstrated median arcuate ligament syndrome. Patient does not have postprandial abdominal pain;  she states it's intermittent waxing and waning. I do note this lady has lost 20 pounds since the fall of last year - unintentionally. She is intermittently constipated but takes Trulance daily with good results.  Reflux well controlled on Protonix 40 mg daily.  Due for screening EGD (for varices) and surveillance colonoscopy (history colonic polyp) this year.  I note today her blood pressures running low in the 90 systolic range. History of hypertension takes metoprolol and lisinopril. Followed by Dr. Orvis Brill Past Medical History:  Diagnosis Date  . Asthma   . Chronic abdominal pain   . Chronic back pain   . Chronic constipation   . Cirrhosis ([place])    Metavir score F4 on elastography 2015  . Cyclical vomiting   . Fibroids   . Fibromyalgia   . GERD (gastroesophageal reflux disease)   . H. pylori infection 2014   treated with pylera, had to be treated again as it was not eradicated. Urea breath test then negative after subsequent treatment.   . [name]    HCV RNA positive 09/2012  . Hypertension   . Marijuana use   . Nausea and vomiting    chronic, recurrent  . PONV (postoperative nausea and vomiting)    pt thinks maybe once she had N&V  . Sciatica of left side     Past Surgical History:  Procedure Laterality Date  . COLONOSCOPY  01/18/2008   DJS:HFWYOV rectum.  Long redundant colon, a diminutive sigmoid polyp status post cold biopsy removed. Hyperplastic polyp. Repeat colonoscopy June 2014 due to family history of colon cancer  . COLONOSCOPY WITH ESOPHAGOGASTRODUODENOSCOPY (EGD)  N/A 11/02/2012   ZCH:YIFOYDXA gastric mucosa of doubtful, +H.pylori. Incomplete colonoscopy due to patient unable to tolerate exam, proximal colon seen. Patient refused ACBE.  Marland Kitchen COLONOSCOPY WITH PROPOFOL N/A 03/08/2013   Dr. Gala Romney: colonic polyp-removed as scribed above. Internal Hemorrhoids. Pathology did not reveal any colonic tissue, only mucus. SURVEILLANCE DUE Aug 2019  . ESOPHAGOGASTRODUODENOSCOPY  01/18/2008   RMR: Normal esophagus, normal  stomach  . ESOPHAGOGASTRODUODENOSCOPY (EGD) WITH PROPOFOL N/A 02/09/2016   Normal esophagus, small hiatal hernia, portal hypertensive gastropathy, normal second portion of duodenum. Due in July 2019  . HYSTEROSCOPY  05/11/2016   Procedure: HYSTEROSCOPY;  Surgeon: Jonnie Kind, MD;  Location: [facility];  Service: Gynecology;;  . KNEE SURGERY     left knee  . MULTIPLE EXTRACTIONS WITH ALVEOLOPLASTY N/A 10/15/2013   Procedure: MULTIPLE EXTRACTION WITH ALVEOLOPLASTY;  Surgeon: Gae Bon, DDS;  Location: [facility];  Service: Oral Surgery;  Laterality: N/A;  . POLYPECTOMY N/A 03/08/2013   Procedure: POLYPECTOMY;  Surgeon: Daneil Dolin, MD;  Location: [facility];  Service: Endoscopy;  Laterality: N/A;  . POLYPECTOMY N/A 05/11/2016   Procedure: REMOVAL OF ENDOMETRIAL POLYP;  Surgeon: Jonnie Kind, MD;  Location: [facility];  Service: Gynecology;  Laterality: N/A;  . TOTAL KNEE ARTHROPLASTY Left 02/05/2015   Procedure: LEFT TOTAL KNEE ARTHROPLASTY;  Surgeon: Carole Civil, MD;  Location: [facility];  Service: Orthopedics;  Laterality: Left;    Prior to Admission medications   Medication Sig Start Date End Date Taking?  Authorizing Provider  aspirin 325 MG EC tablet Take 1 tablet (325 mg total) by mouth daily. 06/13/16  Yes Sinda Du, MD  budesonide-formoterol [facility]) 160-4.5 MCG/ACT inhaler Inhale 2 puffs into the lungs 2 (two) times daily.   Yes [provider]  Cyanocobalamin (VITAMIN B 12 PO) Take 1 tablet by mouth daily.   Yes [provider]  DULoxetine (CYMBALTA) 30 MG capsule Take 1 capsule by mouth 2 (two) times daily. 11/02/17  Yes [provider]  ergocalciferol (VITAMIN D2) 50000 units capsule Take 50,000 Units by mouth every 30 (thirty) days.   Yes [provider]  HYDROcodone-acetaminophen (NORCO/VICODIN) 5-325 MG tablet Take 1 tablet by mouth every 6 (six) hours. 11/02/17  Yes [provider]  lisinopril (PRINIVIL,ZESTRIL) 20 MG tablet Take 20 mg by mouth daily.  09/25/15  Yes [provider]  megestrol (MEGACE) 40 MG tablet Take 40 mg by mouth daily.   Yes [provider]  metoCLOPramide (REGLAN) 10 MG tablet Take 1 tablet (10 mg total) by mouth every 6 (six) hours as needed for nausea, vomiting or refractory nausea / vomiting. 11/13/17  Yes Francine Graven, DO  ondansetron (ZOFRAN ODT) 4 MG disintegrating tablet '4mg'$  ODT q4 hours prn nausea/vomit 09/21/17  Yes Milton Ferguson, MD  pantoprazole (PROTONIX) 40 MG tablet Take 1 tablet (40 mg total) by mouth daily before breakfast. Patient taking differently: Take 40 mg by mouth as needed.  05/09/17  Yes Mahala Menghini, PA-C  Plecanatide (TRULANCE) 3 MG TABS Take 1 tablet by mouth daily. 05/09/17  Yes Mahala Menghini, PA-C  traMADol (ULTRAM) 50 MG tablet Take 1 tablet (50 mg total) by mouth every 6 (six) hours as needed. 09/21/17  Yes Milton Ferguson, MD  promethazine (PHENERGAN) 25 MG tablet Take 1 tablet (25 mg total) by mouth every 6 (six) hours as needed for nausea. Patient not taking: Reported on 11/15/2017 09/23/17   Davonna Belling, MD    Allergies as of 11/15/2017 - Review Complete 11/15/2017  Allergen Reaction Noted  . Penicillins Rash     Family History  Problem Relation Age of Onset  . Colon cancer Brother 39          . Multiple myeloma Brother   . Liver cancer Sister   . Prostate cancer Brother   . Pancreatic cancer Brother   . Cancer Mother        breast  . Asthma Mother     Social History    Socioeconomic History  . Marital status: Single    Spouse name: Not on file  . Number of children: 0  . Years of education: Not on file  . Highest education level: Not on file  Occupational History  . Occupation: umemployed   Social Needs  . Financial resource strain: Not on file  . Food insecurity:    Worry: Not on file    Inability: Not on file  . Transportation needs:    Medical: Not on file    Non-medical: Not on file  Tobacco Use  . Smoking status: Current Every Day Smoker    Packs/day: 0.50    Years: 40.00    Pack years: 20.00    Types: Cigarettes  . Smokeless tobacco: Never Used  . Tobacco comment: Smokes one pack of cigarettes daily  Substance and Sexual Activity  . Alcohol use: No    Alcohol/week: 0.0 oz  . Drug use: No    Types: Marijuana    Comment: history of marijuana  in past  . Sexual activity: Never    Birth control/protection: None  Lifestyle  . Physical activity:    Days per week: Not on file    Minutes per session: Not on file  . Stress: Not on file  Relationships  . Social connections:    Talks on phone: Not on file    Gets together: Not on file    Attends religious service: Not on file    Active member of club or organization: Not on file    Attends meetings of clubs or organizations: Not on file    Relationship status: Not on file  . Intimate partner violence:    Fear of current or ex partner: Not on file    Emotionally abused: Not on file    Physically abused: Not on file    Forced sexual activity: Not on file  Other Topics Concern  . Not on file  Social History Narrative  . Not on file    Review of Systems: See HPI, otherwise negative ROS  Physical Exam: BP 94/64   Pulse 89   Temp 98.3 F (36.8 C) (Oral)   Ht '5\' 6"'$  (1.676 m)   Wt 174 lb (78.9 kg)   BMI 28.08 kg/m  General:   Alert,   pleasant and cooperative in NAD SNeck:  Supple; no masses or thyromegaly. No significant cervical adenopathy. Lungs:  Clear throughout to  auscultation.   No wheezes, crackles, or rhonchi. No acute distress. Heart:  Regular rate and rhythm; no murmurs, clicks, rubs,  or gallops. Abdomen: Non-distended, normal bowel sounds.  Soft and nontender without appreciable mass or hepatosplenomegaly.  Pulses:  Normal pulses noted. Extremities:  Without clubbing or edema.  Impression:  62 year old lady with the cirrhosis, chronic abdominal pain, reported gastroparesis, GERD chronic, constipation.  Ongoing chronic abdominal pain. She does have median arcuate ligament syndrome. However, it remains to be seen if this has been contributing to her abdominal pain. She has lost a notable amount of weight unintentionally since the fall of last year. She does not, however, has a postprandial component to her abdominal pain.  Blood pressure certainly running a little low today.  This does not appear to be due to any other acute illness.  Patient due for screening and surveillance EGD colonoscopy, respectively.   Recommendations:  See Dr. Legrand Rams tomorrow to get your BP re-checked  Schedule a screening EGD and Surveillance colonoscopy (variceal screening and polyps, respectively) - Propofol  Duplex ultrasound of celiac artery - assess flow with expiration and inspiration (hx of median arcuate ligament syndrome) to further evaluate as a potential cause of abdominal pain.  No change in medication for the time being  Further recommendations to follow   Notice: This dictation was prepared with Dragon dictation along with smaller phrase technology. Any transcriptional errors that result from this process are unintentional and may not be corrected upon review.

## 2017-11-15 NOTE — Patient Instructions (Signed)
See Dr. Legrand Rams tomorrow to get your BP re-checked  Schedule a screening EGD and Surveillance colonoscopy (variceal screening and polyps, respectively) - Propofol  Duplex ultrasound of celiac artery - assess flow with expiration and inspiration (hx of median arcuate ligament syndrome)  No change in medication for the time being  Further recommendations to follow

## 2017-11-16 ENCOUNTER — Other Ambulatory Visit: Payer: Self-pay | Admitting: *Deleted

## 2017-11-16 ENCOUNTER — Telehealth: Payer: Self-pay | Admitting: *Deleted

## 2017-11-16 DIAGNOSIS — K7460 Unspecified cirrhosis of liver: Secondary | ICD-10-CM

## 2017-11-16 DIAGNOSIS — R109 Unspecified abdominal pain: Secondary | ICD-10-CM

## 2017-11-16 DIAGNOSIS — Z1211 Encounter for screening for malignant neoplasm of colon: Secondary | ICD-10-CM

## 2017-11-16 MED ORDER — PEG 3350-KCL-NA BICARB-NACL 420 G PO SOLR: 4000.0000 mL | Freq: Once | ORAL | 0 refills | Status: AC

## 2017-11-16 NOTE — Telephone Encounter (Signed)
Spoke with pt and is aware of duplex ultrasound appt scheduled for 11/28/17 at 10:00am. Aware this is in [place]. NPO after midnight, can take meds with sip of water. I have also mailed letter to patient.

## 2017-11-16 NOTE — Telephone Encounter (Signed)
Pre-op scheduled for 01/12/18 at 10:00am. LMOVM. Letter mailed.

## 2017-11-28 ENCOUNTER — Ambulatory Visit (HOSPITAL_COMMUNITY)
Admission: RE | Admit: 2017-11-28 | Discharge: 2017-11-28 | Disposition: A | Discharge status: Home / Self Care | Payer: Medicare HMO | Source: Ambulatory Visit | Attending: Cardiovascular Disease | Admitting: Cardiovascular Disease

## 2017-11-28 DIAGNOSIS — R109 Unspecified abdominal pain: Secondary | ICD-10-CM | POA: Insufficient documentation

## 2017-11-28 DIAGNOSIS — I774 Celiac artery compression syndrome: Secondary | ICD-10-CM | POA: Insufficient documentation

## 2017-11-29 DIAGNOSIS — M13862 Other specified arthritis, left knee: Secondary | ICD-10-CM | POA: Diagnosis not present

## 2017-11-29 DIAGNOSIS — G894 Chronic pain syndrome: Secondary | ICD-10-CM | POA: Diagnosis not present

## 2017-11-29 DIAGNOSIS — M461 Sacroiliitis, not elsewhere classified: Secondary | ICD-10-CM | POA: Diagnosis not present

## 2017-11-30 DIAGNOSIS — M13862 Other specified arthritis, left knee: Secondary | ICD-10-CM | POA: Diagnosis not present

## 2017-11-30 DIAGNOSIS — G894 Chronic pain syndrome: Secondary | ICD-10-CM | POA: Diagnosis not present

## 2017-11-30 DIAGNOSIS — M461 Sacroiliitis, not elsewhere classified: Secondary | ICD-10-CM | POA: Diagnosis not present

## 2017-11-30 DIAGNOSIS — I10 Essential (primary) hypertension: Secondary | ICD-10-CM | POA: Diagnosis not present

## 2017-12-01 ENCOUNTER — Other Ambulatory Visit: Payer: Self-pay

## 2017-12-15 ENCOUNTER — Encounter (HOSPITAL_COMMUNITY): Payer: Self-pay | Admitting: Emergency Medicine

## 2017-12-15 ENCOUNTER — Other Ambulatory Visit: Payer: Self-pay

## 2017-12-15 ENCOUNTER — Emergency Department (HOSPITAL_COMMUNITY): Payer: Medicare HMO

## 2017-12-15 ENCOUNTER — Inpatient Hospital Stay (HOSPITAL_COMMUNITY)
Admission: EM | Admit: 2017-12-15 | Discharge: 2017-12-22 | DRG: 683 | Disposition: A | Discharge status: Home / Self Care | Payer: Medicare HMO | Attending: Internal Medicine | Admitting: Internal Medicine

## 2017-12-15 DIAGNOSIS — G43A1 Cyclical vomiting, intractable: Secondary | ICD-10-CM | POA: Diagnosis not present

## 2017-12-15 DIAGNOSIS — K2980 Duodenitis without bleeding: Secondary | ICD-10-CM | POA: Diagnosis not present

## 2017-12-15 DIAGNOSIS — K7030 Alcoholic cirrhosis of liver without ascites: Secondary | ICD-10-CM | POA: Diagnosis present

## 2017-12-15 DIAGNOSIS — K222 Esophageal obstruction: Secondary | ICD-10-CM | POA: Diagnosis present

## 2017-12-15 DIAGNOSIS — G43A0 Cyclical vomiting, not intractable: Secondary | ICD-10-CM | POA: Diagnosis not present

## 2017-12-15 DIAGNOSIS — E860 Dehydration: Secondary | ICD-10-CM | POA: Diagnosis present

## 2017-12-15 DIAGNOSIS — Z800 Family history of malignant neoplasm of digestive organs: Secondary | ICD-10-CM | POA: Diagnosis not present

## 2017-12-15 DIAGNOSIS — Z8619 Personal history of other infectious and parasitic diseases: Secondary | ICD-10-CM

## 2017-12-15 DIAGNOSIS — D72829 Elevated white blood cell count, unspecified: Secondary | ICD-10-CM | POA: Diagnosis not present

## 2017-12-15 DIAGNOSIS — Z86010 Personal history of colonic polyps: Secondary | ICD-10-CM

## 2017-12-15 DIAGNOSIS — M549 Dorsalgia, unspecified: Secondary | ICD-10-CM | POA: Diagnosis present

## 2017-12-15 DIAGNOSIS — Z8042 Family history of malignant neoplasm of prostate: Secondary | ICD-10-CM

## 2017-12-15 DIAGNOSIS — Z96652 Presence of left artificial knee joint: Secondary | ICD-10-CM | POA: Diagnosis present

## 2017-12-15 DIAGNOSIS — R109 Unspecified abdominal pain: Secondary | ICD-10-CM

## 2017-12-15 DIAGNOSIS — R112 Nausea with vomiting, unspecified: Secondary | ICD-10-CM | POA: Diagnosis not present

## 2017-12-15 DIAGNOSIS — K5909 Other constipation: Secondary | ICD-10-CM | POA: Diagnosis present

## 2017-12-15 DIAGNOSIS — I951 Orthostatic hypotension: Secondary | ICD-10-CM | POA: Diagnosis not present

## 2017-12-15 DIAGNOSIS — M797 Fibromyalgia: Secondary | ICD-10-CM | POA: Diagnosis present

## 2017-12-15 DIAGNOSIS — Z6827 Body mass index (BMI) 27.0-27.9, adult: Secondary | ICD-10-CM

## 2017-12-15 DIAGNOSIS — D696 Thrombocytopenia, unspecified: Secondary | ICD-10-CM | POA: Diagnosis not present

## 2017-12-15 DIAGNOSIS — Z7951 Long term (current) use of inhaled steroids: Secondary | ICD-10-CM

## 2017-12-15 DIAGNOSIS — K311 Adult hypertrophic pyloric stenosis: Secondary | ICD-10-CM

## 2017-12-15 DIAGNOSIS — K2950 Unspecified chronic gastritis without bleeding: Secondary | ICD-10-CM | POA: Diagnosis not present

## 2017-12-15 DIAGNOSIS — K7460 Unspecified cirrhosis of liver: Secondary | ICD-10-CM | POA: Diagnosis not present

## 2017-12-15 DIAGNOSIS — E440 Moderate protein-calorie malnutrition: Secondary | ICD-10-CM

## 2017-12-15 DIAGNOSIS — Z880 Allergy status to penicillin: Secondary | ICD-10-CM | POA: Diagnosis not present

## 2017-12-15 DIAGNOSIS — K766 Portal hypertension: Secondary | ICD-10-CM | POA: Diagnosis not present

## 2017-12-15 DIAGNOSIS — R1013 Epigastric pain: Secondary | ICD-10-CM | POA: Diagnosis not present

## 2017-12-15 DIAGNOSIS — I959 Hypotension, unspecified: Secondary | ICD-10-CM | POA: Diagnosis not present

## 2017-12-15 DIAGNOSIS — R1110 Vomiting, unspecified: Secondary | ICD-10-CM | POA: Diagnosis not present

## 2017-12-15 DIAGNOSIS — K219 Gastro-esophageal reflux disease without esophagitis: Secondary | ICD-10-CM | POA: Diagnosis not present

## 2017-12-15 DIAGNOSIS — I10 Essential (primary) hypertension: Secondary | ICD-10-CM | POA: Diagnosis present

## 2017-12-15 DIAGNOSIS — Z825 Family history of asthma and other chronic lower respiratory diseases: Secondary | ICD-10-CM

## 2017-12-15 DIAGNOSIS — K2970 Gastritis, unspecified, without bleeding: Secondary | ICD-10-CM | POA: Diagnosis not present

## 2017-12-15 DIAGNOSIS — Z7982 Long term (current) use of aspirin: Secondary | ICD-10-CM

## 2017-12-15 DIAGNOSIS — Z79891 Long term (current) use of opiate analgesic: Secondary | ICD-10-CM

## 2017-12-15 DIAGNOSIS — R1084 Generalized abdominal pain: Secondary | ICD-10-CM | POA: Diagnosis not present

## 2017-12-15 DIAGNOSIS — N179 Acute kidney failure, unspecified: Secondary | ICD-10-CM | POA: Diagnosis not present

## 2017-12-15 DIAGNOSIS — Z807 Family history of other malignant neoplasms of lymphoid, hematopoietic and related tissues: Secondary | ICD-10-CM

## 2017-12-15 DIAGNOSIS — G8929 Other chronic pain: Secondary | ICD-10-CM | POA: Diagnosis present

## 2017-12-15 DIAGNOSIS — E871 Hypo-osmolality and hyponatremia: Secondary | ICD-10-CM | POA: Diagnosis not present

## 2017-12-15 DIAGNOSIS — F1290 Cannabis use, unspecified, uncomplicated: Secondary | ICD-10-CM | POA: Diagnosis present

## 2017-12-15 DIAGNOSIS — K449 Diaphragmatic hernia without obstruction or gangrene: Secondary | ICD-10-CM | POA: Diagnosis not present

## 2017-12-15 DIAGNOSIS — J45909 Unspecified asthma, uncomplicated: Secondary | ICD-10-CM | POA: Diagnosis present

## 2017-12-15 DIAGNOSIS — Z79899 Other long term (current) drug therapy: Secondary | ICD-10-CM

## 2017-12-15 DIAGNOSIS — F17210 Nicotine dependence, cigarettes, uncomplicated: Secondary | ICD-10-CM | POA: Diagnosis present

## 2017-12-15 DIAGNOSIS — E0590 Thyrotoxicosis, unspecified without thyrotoxic crisis or storm: Secondary | ICD-10-CM | POA: Diagnosis present

## 2017-12-15 LAB — COMPREHENSIVE METABOLIC PANEL
ALT: 16 U/L (ref 14–54)
AST: 15 U/L (ref 15–41)
Albumin: 4.4 g/dL (ref 3.5–5.0)
Alkaline Phosphatase: 56 U/L (ref 38–126)
Anion gap: 13 (ref 5–15)
BUN: 48 mg/dL — ABNORMAL HIGH (ref 6–20)
CO2: 22 mmol/L (ref 22–32)
Calcium: 10.7 mg/dL — ABNORMAL HIGH (ref 8.9–10.3)
Chloride: 97 mmol/L — ABNORMAL LOW (ref 101–111)
Creatinine, Ser: 3.53 mg/dL — ABNORMAL HIGH (ref 0.44–1.00)
GFR calc Af Amer: 15 mL/min — ABNORMAL LOW (ref >60)
GFR calc non Af Amer: 13 mL/min — ABNORMAL LOW (ref >60)
Glucose, Bld: 108 mg/dL — ABNORMAL HIGH (ref 65–99)
Potassium: 3.3 mmol/L — ABNORMAL LOW (ref 3.5–5.1)
Sodium: 132 mmol/L — ABNORMAL LOW (ref 135–145)
Total Bilirubin: 1.5 mg/dL — ABNORMAL HIGH (ref 0.3–1.2)
Total Protein: 8.2 g/dL — ABNORMAL HIGH (ref 6.5–8.1)

## 2017-12-15 LAB — URINALYSIS, ROUTINE W REFLEX MICROSCOPIC
Bilirubin Urine: NEGATIVE
Glucose, UA: NEGATIVE mg/dL
Hgb urine dipstick: NEGATIVE
Ketones, ur: NEGATIVE mg/dL
Leukocytes, UA: NEGATIVE
Nitrite: NEGATIVE
Protein, ur: 30 mg/dL — AB
Specific Gravity, Urine: 1.017 (ref 1.005–1.030)
pH: 5 (ref 5.0–8.0)

## 2017-12-15 LAB — CBC WITH DIFFERENTIAL/PLATELET
Basophils Absolute: 0 10*3/uL (ref 0.0–0.1)
Basophils Relative: 0 %
Eosinophils Absolute: 0.1 10*3/uL (ref 0.0–0.7)
Eosinophils Relative: 1 %
HCT: 42.5 % (ref 36.0–46.0)
Hemoglobin: 14.5 g/dL (ref 12.0–15.0)
Lymphocytes Relative: 16 %
Lymphs Abs: 1.5 10*3/uL (ref 0.7–4.0)
MCH: 30.9 pg (ref 26.0–34.0)
MCHC: 34.1 g/dL (ref 30.0–36.0)
MCV: 90.6 fL (ref 78.0–100.0)
Monocytes Absolute: 1 10*3/uL (ref 0.1–1.0)
Monocytes Relative: 11 %
Neutro Abs: 6.6 10*3/uL (ref 1.7–7.7)
Neutrophils Relative %: 72 %
Platelets: 226 10*3/uL (ref 150–400)
RBC: 4.69 MIL/uL (ref 3.87–5.11)
RDW: 13.7 % (ref 11.5–15.5)
WBC: 9.2 10*3/uL (ref 4.0–10.5)

## 2017-12-15 LAB — LIPASE, BLOOD: Lipase: 29 U/L (ref 11–51)

## 2017-12-15 MED ORDER — ACETAMINOPHEN 650 MG RE SUPP: 650.0000 mg | Freq: Four times a day (QID) | RECTAL | Status: DC | PRN

## 2017-12-15 MED ORDER — SODIUM CHLORIDE 0.9 % IV BOLUS
500.0000 mL | Freq: Once | INTRAVENOUS | Status: AC
  Administered 2017-12-15: 500 mL via INTRAVENOUS

## 2017-12-15 MED ORDER — ACETAMINOPHEN 325 MG PO TABS
650.0000 mg | ORAL_TABLET | Freq: Four times a day (QID) | ORAL | Status: DC | PRN
  Administered 2017-12-16 – 2017-12-17 (×3): 650 mg via ORAL
  Filled 2017-12-15 (×3): qty 2

## 2017-12-15 MED ORDER — BOOST / RESOURCE BREEZE PO LIQD CUSTOM
1.0000 | Freq: Three times a day (TID) | ORAL | Status: DC
  Administered 2017-12-15: 1 via ORAL
  Administered 2017-12-16 – 2017-12-17 (×2): via ORAL
  Administered 2017-12-18 – 2017-12-22 (×6): 1 via ORAL

## 2017-12-15 MED ORDER — PLECANATIDE 3 MG PO TABS
1.0000 | ORAL_TABLET | Freq: Every day | ORAL | Status: DC
  Filled 2017-12-15 (×4): qty 1

## 2017-12-15 MED ORDER — PROMETHAZINE HCL 12.5 MG PO TABS
12.5000 mg | ORAL_TABLET | Freq: Three times a day (TID) | ORAL | Status: DC | PRN
  Administered 2017-12-17: 12.5 mg via ORAL
  Filled 2017-12-15: qty 1

## 2017-12-15 MED ORDER — ONDANSETRON HCL 4 MG/2ML IJ SOLN
4.0000 mg | Freq: Once | INTRAMUSCULAR | Status: AC
  Administered 2017-12-15: 4 mg via INTRAVENOUS
  Filled 2017-12-15: qty 2

## 2017-12-15 MED ORDER — ONDANSETRON HCL 4 MG PO TABS: 4.0000 mg | ORAL_TABLET | Freq: Four times a day (QID) | ORAL | Status: DC | PRN

## 2017-12-15 MED ORDER — MORPHINE SULFATE (PF) 2 MG/ML IV SOLN
1.0000 mg | INTRAVENOUS | Status: DC | PRN
  Administered 2017-12-15 – 2017-12-22 (×29): 1 mg via INTRAVENOUS
  Filled 2017-12-15 (×31): qty 1

## 2017-12-15 MED ORDER — MOMETASONE FURO-FORMOTEROL FUM 200-5 MCG/ACT IN AERO
2.0000 | INHALATION_SPRAY | Freq: Two times a day (BID) | RESPIRATORY_TRACT | Status: DC
  Administered 2017-12-16 – 2017-12-22 (×11): 2 via RESPIRATORY_TRACT
  Filled 2017-12-15 (×2): qty 8.8

## 2017-12-15 MED ORDER — POTASSIUM CHLORIDE IN NACL 20-0.9 MEQ/L-% IV SOLN
INTRAVENOUS | Status: DC
  Administered 2017-12-15 – 2017-12-17 (×4): via INTRAVENOUS

## 2017-12-15 MED ORDER — MEGESTROL ACETATE 40 MG PO TABS
40.0000 mg | ORAL_TABLET | Freq: Every day | ORAL | Status: DC
  Administered 2017-12-16: 40 mg via ORAL
  Filled 2017-12-15 (×4): qty 1

## 2017-12-15 MED ORDER — DULOXETINE HCL 30 MG PO CPEP
30.0000 mg | ORAL_CAPSULE | Freq: Two times a day (BID) | ORAL | Status: DC
  Administered 2017-12-15 – 2017-12-16 (×2): 30 mg via ORAL
  Filled 2017-12-15 (×2): qty 1

## 2017-12-15 MED ORDER — SODIUM CHLORIDE 0.9 % IV BOLUS
1000.0000 mL | Freq: Once | INTRAVENOUS | Status: AC
  Administered 2017-12-15: 1000 mL via INTRAVENOUS

## 2017-12-15 MED ORDER — ASPIRIN EC 81 MG PO TBEC
81.0000 mg | DELAYED_RELEASE_TABLET | Freq: Every day | ORAL | Status: DC
  Administered 2017-12-16 – 2017-12-20 (×5): 81 mg via ORAL
  Filled 2017-12-15 (×5): qty 1

## 2017-12-15 MED ORDER — NICOTINE 14 MG/24HR TD PT24
14.0000 mg | MEDICATED_PATCH | Freq: Every day | TRANSDERMAL | Status: DC
  Administered 2017-12-15 – 2017-12-22 (×9): 14 mg via TRANSDERMAL
  Filled 2017-12-15 (×8): qty 1

## 2017-12-15 MED ORDER — FAMOTIDINE IN NACL 20-0.9 MG/50ML-% IV SOLN
20.0000 mg | INTRAVENOUS | Status: DC
  Administered 2017-12-15: 20 mg via INTRAVENOUS
  Filled 2017-12-15: qty 50

## 2017-12-15 MED ORDER — ENOXAPARIN SODIUM 30 MG/0.3ML ~~LOC~~ SOLN
30.0000 mg | SUBCUTANEOUS | Status: DC
  Administered 2017-12-15: 30 mg via SUBCUTANEOUS
  Filled 2017-12-15: qty 0.3

## 2017-12-15 MED ORDER — SENNOSIDES-DOCUSATE SODIUM 8.6-50 MG PO TABS: 1.0000 | ORAL_TABLET | Freq: Every evening | ORAL | Status: DC | PRN

## 2017-12-15 MED ORDER — ONDANSETRON HCL 4 MG/2ML IJ SOLN
4.0000 mg | Freq: Four times a day (QID) | INTRAMUSCULAR | Status: DC | PRN
  Administered 2017-12-16: 4 mg via INTRAVENOUS
  Filled 2017-12-15: qty 2

## 2017-12-15 NOTE — ED Provider Notes (Signed)
Angiocath insertion Performed by: Merrily Pew  Consent: Verbal consent obtained. Risks and benefits: risks, benefits and alternatives were discussed Time out: Immediately prior to procedure a "time out" was called to verify the correct patient, procedure, equipment, support staff and site/side marked as required.  Preparation: Patient was prepped and draped in the usual sterile fashion.  Vein Location: Left basilic  Ultrasound Guided  Gauge: 20  Normal blood return and flush without difficulty Patient tolerance: Patient tolerated the procedure well with no immediate complications.      Merrily Pew, MD 12/15/17 [phone]

## 2017-12-15 NOTE — ED Provider Notes (Signed)
[facility] EMERGENCY DEPARTMENT Provider Note   CSN: [id number] Arrival date & time: 12/15/17  0940     History   Chief Complaint Chief Complaint  Patient presents with  . Hypotension  . Emesis    HPI Colleen Lee is a 62 y.o. female.  HPI Patient with a history of cirrhosis and chronic vomiting.  States she has been vomiting for the past 2 weeks.  States she is having normal bowel movements.  No blood in the vomit or stool.  Was seen by her primary physician today and sent to the emergency department for hypotension.  Patient denies lightheadedness or dizziness.  States he is having mild generalized abdominal pain.  Denies fever or chills.  No urinary symptoms. Past Medical History:  Diagnosis Date  . Asthma   . Chronic abdominal pain   . Chronic back pain   . Chronic constipation   . Cirrhosis ([place])    Metavir score F4 on elastography 2015  . Cyclical vomiting   . Fibroids   . Fibromyalgia   . GERD (gastroesophageal reflux disease)   . H. pylori infection 2014   treated with pylera, had to be treated again as it was not eradicated. Urea breath test then negative after subsequent treatment.   . [name]    HCV RNA positive 09/2012  . Hypertension   . Marijuana use   . Nausea and vomiting    chronic, recurrent  . PONV (postoperative nausea and vomiting)    pt thinks maybe once she had N&V  . Sciatica of left side     Patient Active Problem List   Diagnosis Date Noted  . Malnutrition of moderate degree 12/17/2017  . Acute renal failure (ARF) ([place]) 12/15/2017  . Chronic abdominal pain 12/15/2017  . Gastroenteritis 06/14/2016  . Nausea with vomiting 06/10/2016  . Uterine enlargement 06/09/2016  . Intractable nausea and vomiting 06/08/2016  . Acute infective gastroenteritis 06/08/2016  . Diarrhea 06/08/2016  . Essential hypertension 06/08/2016  . GERD (gastroesophageal reflux disease) 06/08/2016  . Abdominal pain   . Endometrial polyp 04/15/2016  . PMB  (postmenopausal bleeding) 02/27/2016  . Alcoholic cirrhosis of liver without ascites ([place])   . Asthma 01/21/2016  . Hepatic cirrhosis ([place]) 09/22/2015  . Arthritis of knee, degenerative 02/05/2015  . History of Helicobacter pylori infection 10/30/2014  . Chronic [name] with cirrhosis ([place]) 04/11/2014  . De Quervain's disease (radial styloid tenosynovitis) 12/25/2013  . Anorexia 11/21/2012  . FH: colon cancer 11/21/2012  . Early satiety 10/25/2012  . Bowel habit changes 10/25/2012  . Abdominal pain, epigastric 10/25/2012  . Abdominal bloating 10/25/2012  . Constipation 10/25/2012  . Abnormal weight loss 10/25/2012  . Chronic viral [name] ([place]) 10/25/2012  . Radicular pain of left lower extremity 09/28/2012  . Back pain 09/28/2012  . Sciatica 08/10/2011  . S/P arthroscopy of left knee 08/10/2011  . Tibial plateau fracture 08/10/2011  . Pain in joint, lower leg 02/12/2011  . Stiffness of joint, not elsewhere classified, lower leg 02/12/2011  . 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 Meniscus, medial, derangement 12/29/2010  . CLOSED FRACTURE OF UPPER END OF TIBIA 08/12/2010    Past Surgical History:  Procedure Laterality Date  . COLONOSCOPY  01/18/2008   HQI:ONGEXB rectum.  Long redundant colon, a diminutive sigmoid polyp status post cold biopsy removed. Hyperplastic polyp. Repeat colonoscopy June 2014 due to family history of colon cancer  . COLONOSCOPY WITH ESOPHAGOGASTRODUODENOSCOPY (EGD) N/A 11/02/2012   MWU:XLKGMWNU gastric mucosa of  doubtful, +H.pylori. Incomplete colonoscopy due to patient unable to tolerate exam, proximal colon seen. Patient refused ACBE.  Marland Kitchen COLONOSCOPY WITH PROPOFOL N/A 03/08/2013   Dr. Gala Romney: colonic polyp-removed as scribed above. Internal Hemorrhoids. Pathology did not reveal any colonic tissue, only mucus. SURVEILLANCE DUE Aug 2019  . ESOPHAGOGASTRODUODENOSCOPY  01/18/2008   RMR: Normal esophagus, normal  stomach  . ESOPHAGOGASTRODUODENOSCOPY  (EGD) WITH PROPOFOL N/A 02/09/2016   Normal esophagus, small hiatal hernia, portal hypertensive gastropathy, normal second portion of duodenum. Due in July 2019  . HYSTEROSCOPY  05/11/2016   Procedure: HYSTEROSCOPY;  Surgeon: Jonnie Kind, MD;  Location: [facility];  Service: Gynecology;;  . KNEE SURGERY     left knee  . MULTIPLE EXTRACTIONS WITH ALVEOLOPLASTY N/A 10/15/2013   Procedure: MULTIPLE EXTRACTION WITH ALVEOLOPLASTY;  Surgeon: Gae Bon, DDS;  Location: [place];  Service: Oral Surgery;  Laterality: N/A;  . POLYPECTOMY N/A 03/08/2013   Procedure: POLYPECTOMY;  Surgeon: Daneil Dolin, MD;  Location: [facility];  Service: Endoscopy;  Laterality: N/A;  . POLYPECTOMY N/A 05/11/2016   Procedure: REMOVAL OF ENDOMETRIAL POLYP;  Surgeon: Jonnie Kind, MD;  Location: [facility];  Service: Gynecology;  Laterality: N/A;  . TOTAL KNEE ARTHROPLASTY Left 02/05/2015   Procedure: LEFT TOTAL KNEE ARTHROPLASTY;  Surgeon: Carole Civil, MD;  Location: [facility];  Service: Orthopedics;  Laterality: Left;     OB History    Gravida      Para      Term      Preterm      AB      Living  1     SAB      TAB      Ectopic      Multiple      Live Births               Home Medications    Prior to Admission medications   Medication Sig Start Date End Date Taking? Authorizing Provider  aspirin 325 MG EC tablet Take 1 tablet (325 mg total) by mouth daily. 06/13/16  Yes Sinda Du, MD  budesonide-formoterol [facility]) 160-4.5 MCG/ACT inhaler Inhale 2 puffs into the lungs 2 (two) times daily.   Yes [provider]  Cyanocobalamin (VITAMIN B 12 PO) Take 1 tablet by mouth daily.   Yes [provider]  DULoxetine (CYMBALTA) 30 MG capsule Take 1 capsule by mouth 2 (two) times daily. 11/02/17  Yes [provider]  ergocalciferol (VITAMIN D2) 50000 units capsule Take 50,000 Units by mouth every 30 (thirty) days.   Yes [provider]  HYDROcodone-acetaminophen  (NORCO/VICODIN) 5-325 MG tablet Take 1 tablet by mouth as needed for moderate pain.  11/02/17  Yes [provider]  hydrOXYzine (ATARAX/VISTARIL) 10 MG tablet  11/30/17  Yes [provider]  lisinopril (PRINIVIL,ZESTRIL) 20 MG tablet Take 20 mg by mouth daily.  09/25/15  Yes [provider]  megestrol (MEGACE) 40 MG tablet Take 40 mg by mouth daily.   Yes [provider]  ondansetron (ZOFRAN ODT) 4 MG disintegrating tablet 10m ODT q4 hours prn nausea/vomit 09/21/17  Yes ZMilton Ferguson MD  Plecanatide (TRULANCE) 3 MG TABS Take 1 tablet by mouth daily. 05/09/17  Yes LMahala Menghini PA-C  GAVILYTE-G 236 g solution as needed.  11/16/17   [provider]  traMADol (ULTRAM) 50 MG tablet Take 1 tablet (50 mg total) by mouth every 6 (six) hours as needed. 09/21/17   ZMilton Ferguson MD  Family History Family History  Problem Relation Age of Onset  . Colon cancer Brother 68          . Multiple myeloma Brother   . Liver cancer Sister   . Prostate cancer Brother   . Pancreatic cancer Brother   . Cancer Mother        breast  . Asthma Mother     Social History Social History   Tobacco Use  . Smoking status: Current Every Day Smoker    Packs/day: 0.50    Years: 40.00    Pack years: 20.00    Types: Cigarettes  . Smokeless tobacco: Never Used  . Tobacco comment: Smokes one pack of cigarettes daily  Substance Use Topics  . Alcohol use: No    Alcohol/week: 0.0 oz  . Drug use: No    Types: Marijuana    Comment: history of marijuana in past     Allergies   Penicillins   Review of Systems Review of Systems  Constitutional: Negative for chills and fever.  HENT: Negative for trouble swallowing.   Respiratory: Negative for shortness of breath.   Cardiovascular: Negative for chest pain.  Gastrointestinal: Positive for abdominal distention, abdominal pain, nausea and vomiting. Negative for constipation and diarrhea.  Genitourinary: Negative for  dysuria, flank pain, frequency and hematuria.  Musculoskeletal: Negative for back pain, myalgias, neck pain and neck stiffness.  Skin: Negative for rash and wound.  Neurological: Negative for dizziness, weakness, light-headedness, numbness and headaches.  All other systems reviewed and are negative.    Physical Exam Updated Vital Signs BP (!) 165/80 (BP Location: Right Arm)   Pulse 80   Temp 99.6 F (37.6 C) (Oral)   Resp 18   Ht _0  (1.676 m)   Wt 77.8 kg (171 lb 8.3 oz)   SpO2 97%   BMI 27.68 kg/m   Physical Exam  Constitutional: She is oriented to person, place, and time. She appears well-developed and well-nourished. No distress.  Chronically ill-appearing.  HENT:  Head: Normocephalic and atraumatic.  Mouth/Throat: Oropharynx is clear and moist. No oropharyngeal exudate.  Eyes: Pupils are equal, round, and reactive to light. EOM are normal.  Neck: Normal range of motion. Neck supple.  Cardiovascular: Normal rate and regular rhythm. Exam reveals no gallop and no friction rub.  No murmur heard. Pulmonary/Chest: Effort normal and breath sounds normal.  Abdominal: Soft. Bowel sounds are normal. She exhibits distension. There is no tenderness. There is no rebound and no guarding.  Mild diffuse abdominal tenderness without swelling without focal tenderness.  No rebound or guarding.  Musculoskeletal: Normal range of motion. She exhibits no edema or tenderness.  No CVA tenderness bilaterally.  Distal pulses intact.  Neurological: She is alert and oriented to person, place, and time.  Moves all extremities without focal deficit.  Sensation fully intact.  Skin: Skin is warm. No rash noted. She is not diaphoretic. No erythema.  Psychiatric: She has a normal mood and affect. Her behavior is normal.  Nursing note and vitals reviewed.    ED Treatments / Results  Labs (all labs ordered are listed, but only abnormal results are displayed) Labs Reviewed  URINALYSIS, ROUTINE W  REFLEX MICROSCOPIC - Abnormal; Notable for the following components:      Result Value   APPearance HAZY (*)    Protein, ur 30 (*)    Bacteria, UA RARE (*)    All other components within normal limits  COMPREHENSIVE METABOLIC PANEL - Abnormal; Notable for the  following components:   Sodium 132 (*)    Potassium 3.3 (*)    Chloride 97 (*)    Glucose, Bld 108 (*)    BUN 48 (*)    Creatinine, Ser 3.53 (*)    Calcium 10.7 (*)    Total Protein 8.2 (*)    Total Bilirubin 1.5 (*)    GFR calc non Af Amer 13 (*)    GFR calc Af Amer 15 (*)    All other components within normal limits  COMPREHENSIVE METABOLIC PANEL - Abnormal; Notable for the following components:   Sodium 133 (*)    CO2 20 (*)    BUN 31 (*)    Creatinine, Ser 1.59 (*)    AST 13 (*)    Total Bilirubin 1.6 (*)    GFR calc non Af Amer 34 (*)    GFR calc Af Amer 39 (*)    All other components within normal limits  VITAMIN D 25 HYDROXY (VIT D DEFICIENCY, FRACTURES) - Abnormal; Notable for the following components:   Vit D, 25-Hydroxy 15.0 (*)    All other components within normal limits  CBC WITH DIFFERENTIAL/PLATELET  LIPASE, BLOOD  HIV ANTIBODY (ROUTINE TESTING)  MAGNESIUM  CBC WITH DIFFERENTIAL/PLATELET  CBC WITH DIFFERENTIAL/PLATELET  COMPREHENSIVE METABOLIC PANEL  MAGNESIUM    EKG EKG Interpretation  Date/Time:  Thursday Dec 15 2017 10:01:27 EDT Ventricular Rate:  55 PR Interval:    QRS Duration: 107 QT Interval:  444 QTC Calculation: 425 R Axis:   74 Text Interpretation:  Sinus rhythm Probable left ventricular hypertrophy Nonspecific T abnrm, anterolateral leads similar to prior 4/19 Confirmed by Aletta Edouard [phone]) on 12/16/2017 11:31:45 AM   Radiology US Renal  Result Date: 12/16/2017 CLINICAL DATA:  Acute renal failure. EXAM: RENAL / URINARY TRACT ULTRASOUND COMPLETE COMPARISON:  CT, 10/07/2016 FINDINGS: Right Kidney: Length: 11.1 cm. Slight prominence of the right renal pelvis, but no convincing  hydronephrosis. No renal masses or stones. Left Kidney: Length: 10.9 cm. Echogenicity within normal limits. No mass or hydronephrosis visualized. Bladder: Appears normal for degree of bladder distention. IMPRESSION: Normal renal ultrasound. Electronically Signed   By: Lajean Manes M.D.   On: 12/16/2017 12:05   Dg Abd Acute W/chest  Result Date: 12/15/2017 CLINICAL DATA:  Abdominal pain.  Nausea and vomiting for 2 weeks. EXAM: DG ABDOMEN ACUTE W/ 1V CHEST COMPARISON:  11/13/2017 FINDINGS: Heart size is normal. The lungs are free of focal consolidations and pleural effusions. There is no pulmonary edema. Mildly prominent interstitial markings appear stable. There is no free air beneath the diaphragm. Bowel gas pattern is nonobstructed. Calcifications in the pelvis are consistent with uterine fibroids. There are degenerative changes in the hips. IMPRESSION: Negative abdominal radiographs. No acute cardiopulmonary disease. Calcified uterine fibroids. Electronically Signed   By: Nolon Nations M.D.   On: 12/15/2017 12:39    Procedures Procedures (including critical care time)  Medications Ordered in ED Medications  aspirin EC tablet 81 mg (81 mg Oral Given 12/16/17 0920)  mometasone-formoterol (DULERA) 200-5 MCG/ACT inhaler 2 puff (2 puffs Inhalation Not Given 12/17/17 0748)  megestrol (MEGACE) tablet 40 mg (40 mg Oral Given 12/16/17 1130)  Plecanatide TABS 1 tablet (1 tablet Oral Not Given 12/16/17 1103)  0.9 % NaCl with KCl 20 mEq/ L  infusion ( Intravenous New Bag/Given 12/17/17 0317)  acetaminophen (TYLENOL) tablet 650 mg (650 mg Oral Given 12/16/17 2002)    Or  acetaminophen (TYLENOL) suppository 650 mg ( Rectal See Alternative 12/16/17  2002)  senna-docusate (Senokot-S) tablet 1 tablet (has no administration in time range)  morphine 2 MG/ML injection 1 mg (1 mg Intravenous Given 12/17/17 0535)  promethazine (PHENERGAN) tablet 12.5 mg (12.5 mg Oral Given 12/17/17 0053)  nicotine (NICODERM CQ - dosed  in mg/24 hours) patch 14 mg (14 mg Transdermal Patch Applied 12/16/17 0921)  feeding supplement (BOOST / RESOURCE BREEZE) liquid 1 Container ( Oral Given 12/16/17 2253)  enoxaparin (LOVENOX) injection 40 mg (40 mg Subcutaneous Given 12/16/17 1602)  pantoprazole (PROTONIX) EC tablet 40 mg (40 mg Oral Given 12/16/17 1602)  ondansetron (ZOFRAN) tablet 4 mg (4 mg Oral Given 12/16/17 2253)  saccharomyces boulardii (FLORASTOR) capsule 250 mg (250 mg Oral Given 12/16/17 2253)  DULoxetine (CYMBALTA) DR capsule 30 mg (has no administration in time range)  ondansetron (ZOFRAN) injection 4 mg (4 mg Intravenous Given 12/17/17 0535)  sodium chloride 0.9 % bolus 1,000 mL (0 mLs Intravenous Stopped 12/15/17 1247)  ondansetron (ZOFRAN) injection 4 mg (4 mg Intravenous Given 12/15/17 1207)  sodium chloride 0.9 % bolus 500 mL (0 mLs Intravenous Stopped 12/15/17 1718)     Initial Impression / Assessment and Plan / ED Course  I have reviewed the triage vital signs and the nursing notes.  Pertinent labs & imaging results that were available during my care of the patient were reviewed by me and considered in my medical decision making (see chart for details).     Some initial improvement in blood pressure with IV fluids.  Patient has acute on chronic renal failure.  Question dehydration.  Abdominal series without evidence of obstruction.  Repeat abdominal exam is benign.   Discussed with hospitalist who will see patient in the emergency department and admit.  Final Clinical Impressions(s) / ED Diagnoses   Final diagnoses:  Acute renal failure (ARF) ([place])  Acute renal failure, unspecified acute renal failure type [facility])    ED Discharge Orders    None       [name], MD 12/17/17 [phone]

## 2017-12-15 NOTE — H&P (Addendum)
History and Physical  Colleen Lee HQI:[id number] DOB: September 01, 1955 DOA: 12/15/2017  Referring physician: Lita Mains PCP: Rosita Fire, MD   Chief Complaint: low blood pressure  HPI: Colleen Lee is a 62 y.o. female with history of chronic abdominal pain chronic nausea and vomiting presented to her PCP office earlier today with complaints of 2 weeks of nausea and vomiting.  The patient was noted to be hypotensive and was sent to the emergency department for further evaluation.  The patient was noted to be hypotensive with a systolic blood pressure reading in the 90s.  She was also noted to have a heart rate in the 60 range.  The patient reports that she has been having intractable nausea and vomiting for 2 weeks and not able to keep down fluids.  Patient denies fever and chills.  She denies chest pain and shortness of breath.  The patient has chronic abdominal pain which is not severely changed or exacerbated recently.  She has been having normal bowel movements.  She does not have any blood in the stool.  She denies dizziness and lightheadedness.  The patient was noted to have an acute renal failure.  This is likely from dehydration.  She was clinically dehydrated on arrival to the ED.  She was given fluids and her blood pressures have improved however they continue to drop back down after about 30 minutes.  She is being admitted for treatment of acute renal failure, dehydration and intractable nausea and vomiting.  Review of Systems: All systems reviewed and apart from history of presenting illness, are negative.  Past Medical History:  Diagnosis Date  . Asthma   . Chronic abdominal pain   . Chronic back pain   . Chronic constipation   . Cirrhosis ([place])    Metavir score F4 on elastography 2015  . Cyclical vomiting   . Fibroids   . Fibromyalgia   . GERD (gastroesophageal reflux disease)   . H. pylori infection 2014   treated with pylera, had to be treated again as it was not  eradicated. Urea breath test then negative after subsequent treatment.   . [name]    HCV RNA positive 09/2012  . Hypertension   . Marijuana use   . Nausea and vomiting    chronic, recurrent  . PONV (postoperative nausea and vomiting)    pt thinks maybe once she had N&V  . Sciatica of left side    Past Surgical History:  Procedure Laterality Date  . COLONOSCOPY  01/18/2008   XLK:GMWNUU rectum.  Long redundant colon, a diminutive sigmoid polyp status post cold biopsy removed. Hyperplastic polyp. Repeat colonoscopy June 2014 due to family history of colon cancer  . COLONOSCOPY WITH ESOPHAGOGASTRODUODENOSCOPY (EGD) N/A 11/02/2012   VOZ:DGUYQIHK gastric mucosa of doubtful, +H.pylori. Incomplete colonoscopy due to patient unable to tolerate exam, proximal colon seen. Patient refused ACBE.  Marland Kitchen COLONOSCOPY WITH PROPOFOL N/A 03/08/2013   Dr. Gala Romney: colonic polyp-removed as scribed above. Internal Hemorrhoids. Pathology did not reveal any colonic tissue, only mucus. SURVEILLANCE DUE Aug 2019  . ESOPHAGOGASTRODUODENOSCOPY  01/18/2008   RMR: Normal esophagus, normal  stomach  . ESOPHAGOGASTRODUODENOSCOPY (EGD) WITH PROPOFOL N/A 02/09/2016   Normal esophagus, small hiatal hernia, portal hypertensive gastropathy, normal second portion of duodenum. Due in July 2019  . HYSTEROSCOPY  05/11/2016   Procedure: HYSTEROSCOPY;  Surgeon: Jonnie Kind, MD;  Location: [facility];  Service: Gynecology;;  . KNEE SURGERY     left knee  . MULTIPLE  EXTRACTIONS WITH ALVEOLOPLASTY N/A 10/15/2013   Procedure: MULTIPLE EXTRACTION WITH ALVEOLOPLASTY;  Surgeon: Gae Bon, DDS;  Location: [facility];  Service: Oral Surgery;  Laterality: N/A;  . POLYPECTOMY N/A 03/08/2013   Procedure: POLYPECTOMY;  Surgeon: Daneil Dolin, MD;  Location: [facility];  Service: Endoscopy;  Laterality: N/A;  . POLYPECTOMY N/A 05/11/2016   Procedure: REMOVAL OF ENDOMETRIAL POLYP;  Surgeon: Jonnie Kind, MD;  Location: [facility];  Service: Gynecology;   Laterality: N/A;  . TOTAL KNEE ARTHROPLASTY Left 02/05/2015   Procedure: LEFT TOTAL KNEE ARTHROPLASTY;  Surgeon: Carole Civil, MD;  Location: [facility];  Service: Orthopedics;  Laterality: Left;   Social History:  reports that she has been smoking cigarettes.  She has a 20.00 pack-year smoking history. She has never used smokeless tobacco. She reports that she does not drink alcohol or use drugs.  Allergies  Allergen Reactions  . Penicillins Rash    Has patient had a PCN reaction causing immediate rash, facial/tongue/throat swelling, SOB or lightheadedness with hypotension: Yes Has patient had a PCN reaction causing severe rash involving mucus membranes or skin necrosis: No Has patient had a PCN reaction that required hospitalization No Has patient had a PCN reaction occurring within the last 10 years: No If all of the above answers are "NO", then may proceed with Cephalosporin use.     Family History  Problem Relation Age of Onset  . Colon cancer Brother 63          . Multiple myeloma Brother   . Liver cancer Sister   . Prostate cancer Brother   . Pancreatic cancer Brother   . Cancer Mother        breast  . Asthma Mother     Prior to Admission medications   Medication Sig Start Date End Date Taking? Authorizing Provider  aspirin 325 MG EC tablet Take 1 tablet (325 mg total) by mouth daily. 06/13/16  Yes Sinda Du, MD  budesonide-formoterol [facility]) 160-4.5 MCG/ACT inhaler Inhale 2 puffs into the lungs 2 (two) times daily.   Yes [provider]  Cyanocobalamin (VITAMIN B 12 PO) Take 1 tablet by mouth daily.   Yes [provider]  DULoxetine (CYMBALTA) 30 MG capsule Take 1 capsule by mouth 2 (two) times daily. 11/02/17  Yes [provider]  ergocalciferol (VITAMIN D2) 50000 units capsule Take 50,000 Units by mouth every 30 (thirty) days.   Yes [provider]  HYDROcodone-acetaminophen (NORCO/VICODIN) 5-325 MG tablet Take 1 tablet by  mouth as needed for moderate pain.  11/02/17  Yes [provider]  hydrOXYzine (ATARAX/VISTARIL) 10 MG tablet  11/30/17  Yes [provider]  lisinopril (PRINIVIL,ZESTRIL) 20 MG tablet Take 20 mg by mouth daily.  09/25/15  Yes [provider]  megestrol (MEGACE) 40 MG tablet Take 40 mg by mouth daily.   Yes [provider]  ondansetron (ZOFRAN ODT) 4 MG disintegrating tablet 64m ODT q4 hours prn nausea/vomit 09/21/17  Yes ZMilton Ferguson MD  Plecanatide (TRULANCE) 3 MG TABS Take 1 tablet by mouth daily. 05/09/17  Yes LMahala Menghini PA-C  GAVILYTE-G 236 g solution as needed.  11/16/17   [provider]  traMADol (ULTRAM) 50 MG tablet Take 1 tablet (50 mg total) by mouth every 6 (six) hours as needed. 09/21/17   ZMilton Ferguson MD   Physical Exam: Vitals:   12/15/17 1030 12/15/17 1208 12/15/17 1215 12/15/17 1300  BP: 96/67 (!) 117/55  90/73  Pulse: (!) 58  62 (!) 57 76  Resp: 14 12 (!) 9 (!) 22  Temp:      TempSrc:      SpO2: 100% 100% 99% 100%  Weight:      Height:        General exam: Moderately built and nourished patient, lying comfortably supine on the gurney in no obvious distress.  Pt appears chronically ill.   Head, eyes and ENT: Nontraumatic and normocephalic. Pupils equally reacting to light and accommodation. Oral mucosa moist.  Neck: Supple. No JVD, carotid bruit or thyromegaly.  Lymphatics: No lymphadenopathy.  Respiratory system: Clear to auscultation. No increased work of breathing.  Cardiovascular system: S1 and S2 heard, RRR. No JVD, murmurs, gallops, clicks or pedal edema.  Gastrointestinal system: Abdomen is mildly distended, soft and generalized tenderness, no guarding. Normal bowel sounds heard. No organomegaly or masses appreciated.  Central nervous system: Alert and oriented. No focal neurological deficits.  Extremities: Symmetric 5 x 5 power. Peripheral pulses symmetrically felt.   Skin: No rashes or acute  findings.  Musculoskeletal system: Negative exam.  Psychiatry: Pleasant and cooperative.  Labs on Admission:  Basic Metabolic Panel: Recent Labs  Lab 12/15/17 1307  NA 132*  K 3.3*  CL 97*  CO2 22  GLUCOSE 108*  BUN 48*  CREATININE 3.53*  CALCIUM 10.7*   Liver Function Tests: Recent Labs  Lab 12/15/17 1307  AST 15  ALT 16  ALKPHOS 56  BILITOT 1.5*  PROT 8.2*  ALBUMIN 4.4   Recent Labs  Lab 12/15/17 1307  LIPASE 29   No results for input(s): AMMONIA in the last 168 hours. CBC: Recent Labs  Lab 12/15/17 1150  WBC 9.2  NEUTROABS 6.6  HGB 14.5  HCT 42.5  MCV 90.6  PLT 226   Cardiac Enzymes: No results for input(s): CKTOTAL, CKMB, CKMBINDEX, TROPONINI in the last 168 hours.  BNP (last 3 results) No results for input(s): PROBNP in the last 8760 hours. CBG: No results for input(s): GLUCAP in the last 168 hours.  Radiological Exams on Admission: Dg Abd Acute W/chest  Result Date: 12/15/2017 CLINICAL DATA:  Abdominal pain.  Nausea and vomiting for 2 weeks. EXAM: DG ABDOMEN ACUTE W/ 1V CHEST COMPARISON:  11/13/2017 FINDINGS: Heart size is normal. The lungs are free of focal consolidations and pleural effusions. There is no pulmonary edema. Mildly prominent interstitial markings appear stable. There is no free air beneath the diaphragm. Bowel gas pattern is nonobstructed. Calcifications in the pelvis are consistent with uterine fibroids. There are degenerative changes in the hips. IMPRESSION: Negative abdominal radiographs. No acute cardiopulmonary disease. Calcified uterine fibroids. Electronically Signed   By: Nolon Nations M.D.   On: 12/15/2017 12:39    EKG: Independently reviewed. Normal sinus rhythm.   Assessment/Plan Principal Problem:   Acute renal failure (ARF) (HCC) Active Problems:   Nausea with vomiting   Abdominal pain, epigastric   History of Helicobacter pylori infection   Hepatic cirrhosis (HCC)   Alcoholic cirrhosis of liver without  ascites (HCC)   Intractable nausea and vomiting   Essential hypertension   GERD (gastroesophageal reflux disease)   Chronic abdominal pain   1. Acute renal failure-secondary to prerenal causes as the patient is clearly dehydrated.  Treating with IV fluid hydration.  The patient has received bolus fluids in the emergency department.  Continue IV fluid hydration.  Follow electrolytes.  Recheck BMP in the morning.  Avoid nephrotoxic agents.  Renally dose medications as appropriate. 2. Hypotension-secondary to dehydration, holding home  blood pressure medications at this time and following.  She is responding to IV fluid hydration and blood pressures are improving.  Monitor on telemetry. 3. Intractable nausea and vomiting-this is a chronic problem that the patient has been dealing with and following with GI outpatient.  I have asked for a GI consultation to see her inpatient as she is scheduled to have an EGD in the next 1-2 months to evaluate for esophageal varices.  Clear liquid diet ordered.  4. Chronic abdominal pain-patient is being worked up outpatient with GI.  She recently had a mesenteric vascular ultrasound: The blood vessels were reportedly open but there is some notation of the median arcuate ligament that may be pressing on 1 of the arteries but it is still open.  I have asked for GI team to consult while patient is in hospital.   5. GERD - IV famotidine ordered.  6. Chronic constipation - resume home medications.   7. Hepatic cirrhosis - history of treated HCV - check HIV and urine drug screen.  8. Opioid seeking behaviors - checking UDS, morphine ordered for severe pain.  9. Essential hypertension - holding home blood pressure medication while blood pressure remains low   DVT Prophylaxis: lovenox Code Status: Full   Family Communication: bedside  Disposition Plan: pending clinical improvement   Time spent: 63 mins  Irwin Brakeman, MD [facility] Pager [phone]  If  7PM-7AM, please contact night-coverage [email or web address] Password [facility] 12/15/2017, 3:03 PM

## 2017-12-15 NOTE — ED Triage Notes (Signed)
Pt reports she was seen by Dr. Legrand Rams today and sent over for hypotension. Has been having N/V/ for two weeks.

## 2017-12-16 ENCOUNTER — Inpatient Hospital Stay (HOSPITAL_COMMUNITY): Payer: Medicare HMO

## 2017-12-16 DIAGNOSIS — N179 Acute kidney failure, unspecified: Principal | ICD-10-CM

## 2017-12-16 DIAGNOSIS — G8929 Other chronic pain: Secondary | ICD-10-CM

## 2017-12-16 DIAGNOSIS — K219 Gastro-esophageal reflux disease without esophagitis: Secondary | ICD-10-CM

## 2017-12-16 DIAGNOSIS — R109 Unspecified abdominal pain: Secondary | ICD-10-CM

## 2017-12-16 DIAGNOSIS — R112 Nausea with vomiting, unspecified: Secondary | ICD-10-CM

## 2017-12-16 DIAGNOSIS — Z8619 Personal history of other infectious and parasitic diseases: Secondary | ICD-10-CM

## 2017-12-16 DIAGNOSIS — R1013 Epigastric pain: Secondary | ICD-10-CM

## 2017-12-16 DIAGNOSIS — K7030 Alcoholic cirrhosis of liver without ascites: Secondary | ICD-10-CM

## 2017-12-16 LAB — CBC WITH DIFFERENTIAL/PLATELET
Basophils Absolute: 0 10*3/uL (ref 0.0–0.1)
Basophils Relative: 1 %
Eosinophils Absolute: 0.1 10*3/uL (ref 0.0–0.7)
Eosinophils Relative: 1 %
HCT: 37.5 % (ref 36.0–46.0)
Hemoglobin: 12.9 g/dL (ref 12.0–15.0)
Lymphocytes Relative: 23 %
Lymphs Abs: 1.3 10*3/uL (ref 0.7–4.0)
MCH: 30.9 pg (ref 26.0–34.0)
MCHC: 34.4 g/dL (ref 30.0–36.0)
MCV: 89.9 fL (ref 78.0–100.0)
Monocytes Absolute: 0.6 10*3/uL (ref 0.1–1.0)
Monocytes Relative: 11 %
Neutro Abs: 3.7 10*3/uL (ref 1.7–7.7)
Neutrophils Relative %: 64 %
Platelets: 214 10*3/uL (ref 150–400)
RBC: 4.17 MIL/uL (ref 3.87–5.11)
RDW: 13.5 % (ref 11.5–15.5)
WBC: 5.7 10*3/uL (ref 4.0–10.5)

## 2017-12-16 LAB — COMPREHENSIVE METABOLIC PANEL
ALT: 15 U/L (ref 14–54)
AST: 13 U/L — ABNORMAL LOW (ref 15–41)
Albumin: 3.8 g/dL (ref 3.5–5.0)
Alkaline Phosphatase: 49 U/L (ref 38–126)
Anion gap: 7 (ref 5–15)
BUN: 31 mg/dL — ABNORMAL HIGH (ref 6–20)
CO2: 20 mmol/L — ABNORMAL LOW (ref 22–32)
Calcium: 9.8 mg/dL (ref 8.9–10.3)
Chloride: 106 mmol/L (ref 101–111)
Creatinine, Ser: 1.59 mg/dL — ABNORMAL HIGH (ref 0.44–1.00)
GFR calc Af Amer: 39 mL/min — ABNORMAL LOW (ref >60)
GFR calc non Af Amer: 34 mL/min — ABNORMAL LOW (ref >60)
Glucose, Bld: 92 mg/dL (ref 65–99)
Potassium: 4 mmol/L (ref 3.5–5.1)
Sodium: 133 mmol/L — ABNORMAL LOW (ref 135–145)
Total Bilirubin: 1.6 mg/dL — ABNORMAL HIGH (ref 0.3–1.2)
Total Protein: 7 g/dL (ref 6.5–8.1)

## 2017-12-16 LAB — MAGNESIUM: Magnesium: 2 mg/dL (ref 1.7–2.4)

## 2017-12-16 MED ORDER — SACCHAROMYCES BOULARDII 250 MG PO CAPS
250.0000 mg | ORAL_CAPSULE | Freq: Two times a day (BID) | ORAL | Status: DC
  Administered 2017-12-16 – 2017-12-17 (×3): 250 mg via ORAL
  Filled 2017-12-16 (×3): qty 1

## 2017-12-16 MED ORDER — ONDANSETRON HCL 4 MG PO TABS
4.0000 mg | ORAL_TABLET | Freq: Three times a day (TID) | ORAL | Status: DC
  Administered 2017-12-16 – 2017-12-20 (×15): 4 mg via ORAL
  Filled 2017-12-16 (×16): qty 1

## 2017-12-16 MED ORDER — PANTOPRAZOLE SODIUM 40 MG PO TBEC
40.0000 mg | DELAYED_RELEASE_TABLET | Freq: Two times a day (BID) | ORAL | Status: DC
  Administered 2017-12-16 – 2017-12-22 (×13): 40 mg via ORAL
  Filled 2017-12-16 (×13): qty 1

## 2017-12-16 MED ORDER — ENOXAPARIN SODIUM 40 MG/0.4ML ~~LOC~~ SOLN
40.0000 mg | SUBCUTANEOUS | Status: DC
  Administered 2017-12-16 – 2017-12-18 (×3): 40 mg via SUBCUTANEOUS
  Filled 2017-12-16 (×4): qty 0.4

## 2017-12-16 MED ORDER — DULOXETINE HCL 30 MG PO CPEP
30.0000 mg | ORAL_CAPSULE | Freq: Every day | ORAL | Status: DC
  Administered 2017-12-17 – 2017-12-22 (×6): 30 mg via ORAL
  Filled 2017-12-16 (×6): qty 1

## 2017-12-16 NOTE — Care Management Important Message (Signed)
Important Message  Patient Details  Name: Colleen Lee MRN: [id number] Date of Birth: [birth year]   Medicare Important Message Given:  Yes    [name] 12/16/2017, 10:55 AM

## 2017-12-16 NOTE — Evaluation (Signed)
Physical Therapy Evaluation Patient Details Name: Colleen Lee MRN: [id number] DOB: June 15, 1956 Today's Date: 12/16/2017   History of Present Illness  Colleen Lee is a 62 y.o. female with history of chronic abdominal pain chronic nausea and vomiting presented to her PCP office earlier today with complaints of 2 weeks of nausea and vomiting.  The patient was noted to be hypotensive and was sent to the emergency department for further evaluation.  The patient was noted to be hypotensive with a systolic blood pressure reading in the 90s.  She was also noted to have a heart rate in the 60 range.  The patient reports that she has been having intractable nausea and vomiting for 2 weeks and not able to keep down fluids.  Patient denies fever and chills.  She denies chest pain and shortness of breath.  The patient has chronic abdominal pain which is not severely changed or exacerbated recently.  She has been having normal bowel movements.  She does not have any blood in the stool.  She denies dizziness and lightheadedness.  The patient was noted to have an acute renal failure.  This is likely from dehydration.  She was clinically dehydrated on arrival to the ED.  She was given fluids and her blood pressures have improved however they continue to drop back down after about 30 minutes.  She is being admitted for treatment of acute renal failure, dehydration and intractable nausea and vomiting.    Clinical Impression  Patient functioning at baseline for functional mobility and gait.  Plan:  Patient discharged from physical therapy to care of nursing for ambulation daily as tolerated for length of stay.    Follow Up Recommendations No PT follow up    Equipment Recommendations  None recommended by PT    Recommendations for Other Services       Precautions / Restrictions Precautions Precautions: None Restrictions Weight Bearing Restrictions: No      Mobility  Bed Mobility Overal bed mobility:  Independent                Transfers Overall transfer level: Modified independent                  Ambulation/Gait Ambulation/Gait assistance: Modified independent (Device/Increase time) Ambulation Distance (Feet): 150 Feet Assistive device: None Gait Pattern/deviations: WFL(Within Functional Limits) Gait velocity: decreased   General Gait Details: grossly WFL except slightly slower than normal cadence  Stairs Stairs: Yes Stairs assistance: Modified independent (Device/Increase time) Stair Management: One rail Right;Step to pattern Number of Stairs: 3 General stair comments: Patient demonstrates good return for going up/down 3 steps using 1 siderail without loss of balance  Wheelchair Mobility    Modified Rankin (Stroke Patients Only)       Balance Overall balance assessment: No apparent balance deficits (not formally assessed)                                           Pertinent Vitals/Pain Pain Assessment: 0-10 Pain Score: 8  Pain Location: stomach Pain Descriptors / Indicators: Aching Pain Intervention(s): Limited activity within patient's tolerance;Monitored during session    Home Living Family/patient expects to be discharged to:: Private residence Living Arrangements: Spouse/significant other Available Help at Discharge: Family Type of Home: House Home Access: Stairs to enter Entrance Stairs-Rails: Right;Left;Can reach both Entrance Stairs-Number of Steps: 4 Home Layout: One level Home Equipment:  Cane - single point;Walker - 2 wheels      Prior Function Level of Independence: Independent               Hand Dominance        Extremity/Trunk Assessment   Upper Extremity Assessment Upper Extremity Assessment: Overall WFL for tasks assessed    Lower Extremity Assessment Lower Extremity Assessment: Overall WFL for tasks assessed    Cervical / Trunk Assessment Cervical / Trunk Assessment: Normal  Communication    Communication: No difficulties  Cognition Arousal/Alertness: Awake/alert Behavior During Therapy: WFL for tasks assessed/performed Overall Cognitive Status: Within Functional Limits for tasks assessed                                        General Comments      Exercises     Assessment/Plan    PT Assessment Patent does not need any further PT services  PT Problem List         PT Treatment Interventions      PT Goals (Current goals can be found in the Care Plan section)  Acute Rehab PT Goals Patient Stated Goal: return home PT Goal Formulation: With patient Time For Goal Achievement: 12/26/17 Potential to Achieve Goals: Good    Frequency     Barriers to discharge        Co-evaluation               AM-PAC PT "6 Clicks" Daily Activity  Outcome Measure Difficulty turning over in bed (including adjusting bedclothes, sheets and blankets)?: None Difficulty moving from lying on back to sitting on the side of the bed? : None Difficulty sitting down on and standing up from a chair with arms (e.g., wheelchair, bedside commode, etc,.)?: None Help needed moving to and from a bed to chair (including a wheelchair)?: None Help needed walking in hospital room?: None Help needed climbing 3-5 steps with a railing? : None 6 Click Score: 24    End of Session   Activity Tolerance: Patient tolerated treatment well Patient left: in bed;with call bell/phone within reach Nurse Communication: Mobility status PT Visit Diagnosis: Unsteadiness on feet (R26.81);Other abnormalities of gait and mobility (R26.89);Muscle weakness (generalized) (M62.81)    Time: 7782-4235 PT Time Calculation (min) (ACUTE ONLY): 27 min   Charges:   PT Evaluation $PT Eval Low Complexity: 1 Low PT Treatments $Therapeutic Activity: 23-37 mins   PT G Codes:        3:51 PM, 2017/12/26 Lonell Grandchild, MPT Physical Therapist with [facility] 336 [phone]  office [phone] mobile phone

## 2017-12-16 NOTE — Progress Notes (Signed)
Report of increased size to T-wave from telemetry tech.  Patient was asleep and easily aroused upon assessment.  Her vital signs are WNL and she denies chest pain.  Dr. Myna Hidalgo notified and an order for and EKG was placed into [facility] and carried out.  I notified him that the EKG was in [facility] for his review.  No further orders have been given at this time.  Patient remains in bed resting quietly.  Nursing staff to continue to monitor

## 2017-12-16 NOTE — Progress Notes (Signed)
PROGRESS NOTE    Colleen Lee  YQM:[id number]  DOB: [birth year]  DOA: 12/15/2017 PCP: Colleen Fire, MD   Brief Admission Hx:  Colleen Lee is a 62 y.o. female with history of chronic abdominal pain chronic nausea and vomiting presented to her PCP office earlier today with complaints of 2 weeks of nausea and vomiting.  The patient was noted to be hypotensive and was sent to the emergency department for further evaluation.  The patient was noted to be hypotensive with a systolic blood pressure reading in the 90s.  MDM/Assessment & Plan:   1. Acute renal failure-Improving. Secondary to prerenal causes as the patient was dehydrated.  Treating with IV fluid hydration.  Continue IV fluid hydration.  Follow electrolytes.  Recheck BMP in the morning.  Avoid nephrotoxic agents.  Renally dose medications as appropriate. 2. Hypotension-Improving. Secondary to dehydration, holding home blood pressure medications at this time and following.  She is responding to IV fluid hydration and blood pressures are improving.  Monitor on telemetry. 3. Intractable nausea and vomiting-this is a chronic problem that the patient has been dealing with and following with GI outpatient.  I have asked for a GI consultation to see her inpatient as she is scheduled to have an EGD in the next 1-2 months to evaluate for esophageal varices.  Clear liquid diet ordered but patient wants to advance.  Will start Full liquid diet this morning.   4. Chronic abdominal pain-patient is being worked up outpatient with GI.  She recently had a mesenteric vascular ultrasound: The blood vessels were reportedly open but there is some notation of the median arcuate ligament that may be pressing on 1 of the arteries but it is still open.  I have asked for GI team to consult while patient is in hospital.  Pt asking for more pain medication.  5. GERD - IV famotidine ordered.  6. Chronic constipation - resume home medications.   7. Hepatic  cirrhosis - history of treated HCV - check HIV and urine drug screen.  8. Opioid seeking behaviors -UDS pending, morphine ordered for severe pain.  9. Essential hypertension - holding home blood pressure medication while blood pressure remains low   DVT Prophylaxis: lovenox Code Status: Full   Family Communication: bedside  Disposition Plan: hopeful to discharge home 5/18 if continues to improve   Subjective: Pt says that she tolerated clear liquids and wants to eat more this morning.  Pt says she has nausea but no emesis.  Pt asking for more pain medications.   Objective: Vitals:   12/15/17 2121 12/16/17 0357 12/16/17 0402 12/16/17 0750  BP:  (!) 86/65 (!) 144/84   Pulse:  63 74   Resp:      Temp:  99.1 F (37.3 C) 98.8 F (37.1 C)   TempSrc:  Oral Oral   SpO2: 98% 100% 98% 98%  Weight:   77.8 kg (171 lb 8.3 oz)   Height:        Intake/Output Summary (Last 24 hours) at 12/16/2017 1018 Last data filed at 12/16/2017 0500 Gross per 24 hour  Intake 2255 ml  Output -  Net 2255 ml   Filed Weights   12/15/17 0956 12/16/17 0402  Weight: 77.1 kg (170 lb) 77.8 kg (171 lb 8.3 oz)     REVIEW OF SYSTEMS  As per history otherwise all reviewed and reported negative  Exam:  General exam: awake, alert, NAD. Cooperative.  Respiratory system: Clear. No increased work of breathing. Cardiovascular  system: S1 & S2 heard, RRR. No JVD, murmurs, gallops, clicks or pedal edema. Gastrointestinal system: Abdomen is nondistended, soft and generalized tenderness noted. Normal bowel sounds heard. Central nervous system: Alert and oriented. No focal neurological deficits. Extremities: no CCE.  Data Reviewed: Basic Metabolic Panel: Recent Labs  Lab 12/15/17 1307 12/16/17 0458  NA 132* 133*  K 3.3* 4.0  CL 97* 106  CO2 22 20*  GLUCOSE 108* 92  BUN 48* 31*  CREATININE 3.53* 1.59*  CALCIUM 10.7* 9.8  MG  --  2.0   Liver Function Tests: Recent Labs  Lab 12/15/17 1307  12/16/17 0458  AST 15 13*  ALT 16 15  ALKPHOS 56 49  BILITOT 1.5* 1.6*  PROT 8.2* 7.0  ALBUMIN 4.4 3.8   Recent Labs  Lab 12/15/17 1307  LIPASE 29   No results for input(s): AMMONIA in the last 168 hours. CBC: Recent Labs  Lab 12/15/17 1150 12/16/17 0458  WBC 9.2 5.7  NEUTROABS 6.6 3.7  HGB 14.5 12.9  HCT 42.5 37.5  MCV 90.6 89.9  PLT 226 214   Cardiac Enzymes: No results for input(s): CKTOTAL, CKMB, CKMBINDEX, TROPONINI in the last 168 hours. CBG (last 3)  No results for input(s): GLUCAP in the last 72 hours. No results found for this or any previous visit (from the past 240 hour(s)).   Studies: Dg Abd Acute W/chest  Result Date: 12/15/2017 CLINICAL DATA:  Abdominal pain.  Nausea and vomiting for 2 weeks. EXAM: DG ABDOMEN ACUTE W/ 1V CHEST COMPARISON:  11/13/2017 FINDINGS: Heart size is normal. The lungs are free of focal consolidations and pleural effusions. There is no pulmonary edema. Mildly prominent interstitial markings appear stable. There is no free air beneath the diaphragm. Bowel gas pattern is nonobstructed. Calcifications in the pelvis are consistent with uterine fibroids. There are degenerative changes in the hips. IMPRESSION: Negative abdominal radiographs. No acute cardiopulmonary disease. Calcified uterine fibroids. Electronically Signed   By: Nolon Nations M.D.   On: 12/15/2017 12:39   Scheduled Meds: . aspirin  81 mg Oral Daily  . DULoxetine  30 mg Oral BID  . enoxaparin (LOVENOX) injection  40 mg Subcutaneous Q24H  . feeding supplement  1 Container Oral TID BM  . megestrol  40 mg Oral Daily  . mometasone-formoterol  2 puff Inhalation BID  . nicotine  14 mg Transdermal Daily  . Plecanatide  1 tablet Oral Daily   Continuous Infusions: . 0.9 % NaCl with KCl 20 mEq / L 100 mL/hr at 12/16/17 0858  . famotidine (PEPCID) IV 20 mg (12/15/17 1824)    Principal Problem:   Acute renal failure (ARF) (HCC) Active Problems:   Nausea with vomiting    Abdominal pain, epigastric   History of Helicobacter pylori infection   Hepatic cirrhosis (HCC)   Alcoholic cirrhosis of liver without ascites (HCC)   Intractable nausea and vomiting   Essential hypertension   GERD (gastroesophageal reflux disease)   Chronic abdominal pain  Time spent:   Irwin Brakeman, MD, FAAFP [facility] Pager [phone]  If 7PM-7AM, please contact night-coverage [email or web address] Password [email or web address] 12/16/2017, 10:18 AM    LOS: 1 day

## 2017-12-16 NOTE — Consult Note (Signed)
Referring Provider: [facility] Primary Care Physician:  Rosita Fire, MD Primary Gastroenterologist:  Dr. Gala Romney  Date of Admission: 12/15/17 Date of Consultation: 12/16/17  Reason for Consultation:  Nausea and vomiting  HPI:  Colleen Lee is a 62 y.o. female with a past medical history of abdominal pain, cirrhosis, cyclical vomiting, GERD, H. pylori infection, [name] who present with complaints of 2 weeks of nausea and vomiting found to be hypotensive and in acute renal failure likely due to dehydration.  In the ER she stated she was unable to keep down fluids, no fever chills.  Chronic abdominal pain is generally unchanged from her baseline.  Normal bowel movements without blood or melena.  Her blood pressure improved with hydration however dropped back down shortly thereafter.  He was admitted for acute renal failure, hypotension secondary to renal failure and dehydration, intractable nausea and vomiting.  Chronic abdominal pain evaluation is ongoing as an outpatient.  Her nausea and vomiting was noted to be chronic as well.  Ongoing outpatient work-up.  There is seems to be an acute on chronic component.  The patient has an upcoming EGD to evaluate for esophageal varices.  She is currently on Zofran and Phenergan as needed.  Today she states she began having worsening abdominal pain as well as N/V about 1.5 weeks ago. Unable to keep down fluids. Also with diarrhea at that time which seems to be trying to solidify and currently is "soft and mushy." Pain is periumbilical, intermittent, and lasting longer than her chronic abdominal pain. Intermittent GERD symptoms despite famotidine. Last bowel movement 2 days ago. Some nausea this morning, no vomiting; improved with SL Zofran. No other GI complaints at this time.  Past Medical History:  Diagnosis Date  . Asthma   . Chronic abdominal pain   . Chronic back pain   . Chronic constipation   . Cirrhosis ([place])    Metavir score F4 on  elastography 2015  . Cyclical vomiting   . Fibroids   . Fibromyalgia   . GERD (gastroesophageal reflux disease)   . H. pylori infection 2014   treated with pylera, had to be treated again as it was not eradicated. Urea breath test then negative after subsequent treatment.   . [name]    HCV RNA positive 09/2012  . Hypertension   . Marijuana use   . Nausea and vomiting    chronic, recurrent  . PONV (postoperative nausea and vomiting)    pt thinks maybe once she had N&V  . Sciatica of left side     Past Surgical History:  Procedure Laterality Date  . COLONOSCOPY  01/18/2008   IEP:PIRJJO rectum.  Long redundant colon, a diminutive sigmoid polyp status post cold biopsy removed. Hyperplastic polyp. Repeat colonoscopy June 2014 due to family history of colon cancer  . COLONOSCOPY WITH ESOPHAGOGASTRODUODENOSCOPY (EGD) N/A 11/02/2012   ACZ:YSAYTKZS gastric mucosa of doubtful, +H.pylori. Incomplete colonoscopy due to patient unable to tolerate exam, proximal colon seen. Patient refused ACBE.  Marland Kitchen COLONOSCOPY WITH PROPOFOL N/A 03/08/2013   Dr. Gala Romney: colonic polyp-removed as scribed above. Internal Hemorrhoids. Pathology did not reveal any colonic tissue, only mucus. SURVEILLANCE DUE Aug 2019  . ESOPHAGOGASTRODUODENOSCOPY  01/18/2008   RMR: Normal esophagus, normal  stomach  . ESOPHAGOGASTRODUODENOSCOPY (EGD) WITH PROPOFOL N/A 02/09/2016   Normal esophagus, small hiatal hernia, portal hypertensive gastropathy, normal second portion of duodenum. Due in July 2019  . HYSTEROSCOPY  05/11/2016   Procedure: HYSTEROSCOPY;  Surgeon: Jonnie Kind, MD;  Location: [facility];  Service: Gynecology;;  . KNEE SURGERY     left knee  . MULTIPLE EXTRACTIONS WITH ALVEOLOPLASTY N/A 10/15/2013   Procedure: MULTIPLE EXTRACTION WITH ALVEOLOPLASTY;  Surgeon: Gae Bon, DDS;  Location: [place];  Service: Oral Surgery;  Laterality: N/A;  . POLYPECTOMY N/A 03/08/2013   Procedure: POLYPECTOMY;  Surgeon: Daneil Dolin,  MD;  Location: [facility];  Service: Endoscopy;  Laterality: N/A;  . POLYPECTOMY N/A 05/11/2016   Procedure: REMOVAL OF ENDOMETRIAL POLYP;  Surgeon: Jonnie Kind, MD;  Location: [facility];  Service: Gynecology;  Laterality: N/A;  . TOTAL KNEE ARTHROPLASTY Left 02/05/2015   Procedure: LEFT TOTAL KNEE ARTHROPLASTY;  Surgeon: Carole Civil, MD;  Location: [facility];  Service: Orthopedics;  Laterality: Left;    Prior to Admission medications   Medication Sig Start Date End Date Taking? Authorizing Provider  aspirin 325 MG EC tablet Take 1 tablet (325 mg total) by mouth daily. 06/13/16  Yes Sinda Du, MD  budesonide-formoterol [facility]) 160-4.5 MCG/ACT inhaler Inhale 2 puffs into the lungs 2 (two) times daily.   Yes [provider]  Cyanocobalamin (VITAMIN B 12 PO) Take 1 tablet by mouth daily.   Yes [provider]  DULoxetine (CYMBALTA) 30 MG capsule Take 1 capsule by mouth 2 (two) times daily. 11/02/17  Yes [provider]  ergocalciferol (VITAMIN D2) 50000 units capsule Take 50,000 Units by mouth every 30 (thirty) days.   Yes [provider]  HYDROcodone-acetaminophen (NORCO/VICODIN) 5-325 MG tablet Take 1 tablet by mouth as needed for moderate pain.  11/02/17  Yes [provider]  hydrOXYzine (ATARAX/VISTARIL) 10 MG tablet  11/30/17  Yes [provider]  lisinopril (PRINIVIL,ZESTRIL) 20 MG tablet Take 20 mg by mouth daily.  09/25/15  Yes [provider]  megestrol (MEGACE) 40 MG tablet Take 40 mg by mouth daily.   Yes [provider]  ondansetron (ZOFRAN ODT) 4 MG disintegrating tablet '4mg'$  ODT q4 hours prn nausea/vomit 09/21/17  Yes Milton Ferguson, MD  Plecanatide (TRULANCE) 3 MG TABS Take 1 tablet by mouth daily. 05/09/17  Yes Mahala Menghini, PA-C  GAVILYTE-G 236 g solution as needed.  11/16/17   [provider]  traMADol (ULTRAM) 50 MG tablet Take 1 tablet (50 mg total) by mouth every 6 (six) hours as needed. 09/21/17    Milton Ferguson, MD    Current Facility-Administered Medications  Medication Dose Route Frequency Provider Last Rate Last Dose  . 0.9 % NaCl with KCl 20 mEq/ L  infusion   Intravenous Continuous Johnson, Clanford L, MD 100 mL/hr at 12/16/17 0858    . acetaminophen (TYLENOL) tablet 650 mg  650 mg Oral Q6H PRN Johnson, Clanford L, MD   650 mg at 12/16/17 0120   Or  . acetaminophen (TYLENOL) suppository 650 mg  650 mg Rectal Q6H PRN Johnson, Clanford L, MD      . aspirin EC tablet 81 mg  81 mg Oral Daily Johnson, Clanford L, MD   81 mg at 12/16/17 0920  . DULoxetine (CYMBALTA) DR capsule 30 mg  30 mg Oral BID Wynetta Emery, Clanford L, MD   30 mg at 12/16/17 0920  . enoxaparin (LOVENOX) injection 40 mg  40 mg Subcutaneous Q24H Johnson, Clanford L, MD      . famotidine (PEPCID) IVPB 20 mg premix  20 mg Intravenous Q24H Johnson, Clanford L, MD 100 mL/hr at 12/15/17 1824 20 mg at 12/15/17 1824  . feeding supplement (BOOST / RESOURCE BREEZE)  liquid 1 Container  1 Container Oral TID BM Murlean Iba, MD   1 Container at 12/15/17 2030  . megestrol (MEGACE) tablet 40 mg  40 mg Oral Daily Johnson, Clanford L, MD      . mometasone-formoterol (DULERA) 200-5 MCG/ACT inhaler 2 puff  2 puff Inhalation BID Wynetta Emery, Clanford L, MD   2 puff at 12/16/17 0750  . morphine 2 MG/ML injection 1 mg  1 mg Intravenous Q3H PRN Johnson, Clanford L, MD   1 mg at 12/16/17 0747  . nicotine (NICODERM CQ - dosed in mg/24 hours) patch 14 mg  14 mg Transdermal Daily Johnson, Clanford L, MD   14 mg at 12/16/17 0921  . ondansetron (ZOFRAN) tablet 4 mg  4 mg Oral Q6H PRN Johnson, Clanford L, MD       Or  . ondansetron (ZOFRAN) injection 4 mg  4 mg Intravenous Q6H PRN Wynetta Emery, Clanford L, MD   4 mg at 12/16/17 0747  . Plecanatide TABS 1 tablet  1 tablet Oral Daily Johnson, Clanford L, MD      . promethazine (PHENERGAN) tablet 12.5 mg  12.5 mg Oral Q8H PRN Johnson, Clanford L, MD      . senna-docusate (Senokot-S) tablet 1 tablet  1  tablet Oral QHS PRN Murlean Iba, MD        Allergies as of 12/15/2017 - Review Complete 12/15/2017  Allergen Reaction Noted  . Penicillins Rash     Family History  Problem Relation Age of Onset  . Colon cancer Brother 88          . Multiple myeloma Brother   . Liver cancer Sister   . Prostate cancer Brother   . Pancreatic cancer Brother   . Cancer Mother        breast  . Asthma Mother     Social History   Socioeconomic History  . Marital status: Single    Spouse name: Not on file  . Number of children: 0  . Years of education: Not on file  . Highest education level: Not on file  Occupational History  . Occupation: umemployed   Social Needs  . Financial resource strain: Not on file  . Food insecurity:    Worry: Not on file    Inability: Not on file  . Transportation needs:    Medical: Not on file    Non-medical: Not on file  Tobacco Use  . Smoking status: Current Every Day Smoker    Packs/day: 0.50    Years: 40.00    Pack years: 20.00    Types: Cigarettes  . Smokeless tobacco: Never Used  . Tobacco comment: Smokes one pack of cigarettes daily  Substance and Sexual Activity  . Alcohol use: No    Alcohol/week: 0.0 oz  . Drug use: No    Types: Marijuana    Comment: history of marijuana in past  . Sexual activity: Never    Birth control/protection: None  Lifestyle  . Physical activity:    Days per week: Not on file    Minutes per session: Not on file  . Stress: Not on file  Relationships  . Social connections:    Talks on phone: Not on file    Gets together: Not on file    Attends religious service: Not on file    Active member of club or organization: Not on file    Attends meetings of clubs or organizations: Not on file    Relationship status: Not  on file  . Intimate partner violence:    Fear of current or ex partner: Not on file    Emotionally abused: Not on file    Physically abused: Not on file    Forced sexual activity: Not on file   Other Topics Concern  . Not on file  Social History Narrative  . Not on file    Review of Systems: General: Negative for anorexia, weight loss, fever, chills, fatigue, weakness. ENT: Negative for hoarseness, difficulty swallowing. CV: Negative for chest pain, angina, palpitations, peripheral edema.  Respiratory: Negative for dyspnea at rest, cough, sputum, wheezing.  GI: See history of present illness. MS: Negative for joint pain, low back pain.  Endo: Negative for unusual weight change.  Heme: Negative for bruising or bleeding. Allergy: Negative for rash or hives.  Physical Exam: Vital signs in last 24 hours: Temp:  [98.6 F (37 C)-99.1 F (37.3 C)] 98.8 F (37.1 C) (05/17 0402) Pulse Rate:  [57-76] 74 (05/17 0402) Resp:  [9-22] 14 (05/16 1808) BP: (86-144)/(55-84) 144/84 (05/17 0402) SpO2:  [98 %-100 %] 98 % (05/17 0750) Weight:  [170 lb (77.1 kg)-171 lb 8.3 oz (77.8 kg)] 171 lb 8.3 oz (77.8 kg) (05/17 0402) Last BM Date: 12/14/17 General:   Alert,  Well-developed, well-nourished, pleasant and cooperative in NAD Head:  Normocephalic and atraumatic. Eyes:  Sclera clear, no icterus. Conjunctiva pink. Ears:  Normal auditory acuity. Neck:  Supple; no masses or thyromegaly. Lungs:  Clear throughout to auscultation. No wheezes, crackles, or rhonchi. No acute distress. Heart:  Regular rate and rhythm; no murmurs, clicks, rubs,  or gallops. Abdomen:  Soft, and nondistended. Mild periumbilical TTP. No masses, hepatosplenomegaly or hernias noted. Normal bowel sounds, without guarding, and without rebound.   Rectal:  Deferred.   Msk:  Symmetrical without gross deformities. Pulses:  Normal bilateral DP pulses noted. Extremities:  Without clubbing or edema. Neurologic:  Alert and  oriented x4; grossly normal neurologically. Skin:  Intact without significant lesions or rashes. Psych:  Alert and cooperative. Normal mood and affect.  Intake/Output from previous day: 05/16 0701 -  05/17 0700 In: 2255 [I.V.:1605; IV Piggyback:650] Out: -  Intake/Output this shift: No intake/output data recorded.  Lab Results: Recent Labs    12/15/17 1150 12/16/17 0458  WBC 9.2 5.7  HGB 14.5 12.9  HCT 42.5 37.5  PLT 226 214   BMET Recent Labs    12/15/17 1307 12/16/17 0458  NA 132* 133*  K 3.3* 4.0  CL 97* 106  CO2 22 20*  GLUCOSE 108* 92  BUN 48* 31*  CREATININE 3.53* 1.59*  CALCIUM 10.7* 9.8   LFT Recent Labs    12/15/17 1307 12/16/17 0458  PROT 8.2* 7.0  ALBUMIN 4.4 3.8  AST 15 13*  ALT 16 15  ALKPHOS 56 49  BILITOT 1.5* 1.6*   PT/INR No results for input(s): LABPROT, INR in the last 72 hours. Hepatitis Panel No results for input(s): HEPBSAG, HCVAB, HEPAIGM, HEPBIGM in the last 72 hours. C-Diff No results for input(s): CDIFFTOX in the last 72 hours.  Studies/Results: Dg Abd Acute W/chest  Result Date: 12/15/2017 CLINICAL DATA:  Abdominal pain.  Nausea and vomiting for 2 weeks. EXAM: DG ABDOMEN ACUTE W/ 1V CHEST COMPARISON:  11/13/2017 FINDINGS: Heart size is normal. The lungs are free of focal consolidations and pleural effusions. There is no pulmonary edema. Mildly prominent interstitial markings appear stable. There is no free air beneath the diaphragm. Bowel gas pattern is nonobstructed. Calcifications in the  pelvis are consistent with uterine fibroids. There are degenerative changes in the hips. IMPRESSION: Negative abdominal radiographs. No acute cardiopulmonary disease. Calcified uterine fibroids. Electronically Signed   By: Nolon Nations M.D.   On: 12/15/2017 12:39    Impression: Pleasant 62 year old female with a history of [name] cirrhosis.  Planned upcoming EGD for variceal surveillance.  Been having nausea, vomiting, diarrhea with worsening acute on chronic abdominal pain about 1-1/2 weeks ago.  She did have some nausea this morning, no vomiting.  Zofran helps.  Diarrhea has begun to start to solidify and is now soft/mushy stools.   Acute on chronic abdominal pain, nausea/vomiting.  Acute kidney injury secondary to dehydration.  Dermal.  Her electrolytes were off and her creatinine was initially 3.53 yesterday.  Stable bilirubin, normal AST/ALT/alkaline phosphatase.  Lipase normal.  Today her creatinine improved to 1.59 with fluids.  Potassium corrected.  Continued stable LFTs.  CBC remains normal.  Query gastroenteritis for acute component of her otherwise chronic symptoms. Possibly GERD contributing given intermittent symptoms despite H2RB. No frequent loose stools.  Plan: 1. Scheduled Zofran ATC 2. Phenergan prn 3. Change H2RB to PPI 4. Probiotic 5. Supportive measures 6. Continue diet, consider advancement this evening if she continues to do well   Thank you for allowing [place] to participate in the care of [place], DNP, AGNP-C Adult & Gerontological Nurse Practitioner [facility]    LOS: 1 day     12/16/2017, 9:46 AM

## 2017-12-16 NOTE — Progress Notes (Addendum)
Initial Nutrition Assessment  DOCUMENTATION CODES:   Non-severe (moderate) malnutrition in context of chronic illness.  INTERVENTION:  Increase the number of hepatic and renal approved meals consumed daily after N & V is resolved.  Decrease daily amount of Mountain Dew consumed.  NUTRITION DIAGNOSIS:  Malnutrition related to chronic cirrhosis and poor quality of diet, poor appetite, chronic nausea and vomiting as evidenced by pt report, significant wt loss of 12.8% x 2 months and energy intake < 75% for >/= 1 month  Unintentional weight loss. related to poor appetite, decreased appetite, acute illness, nausea, vomiting as evidenced by percent weight loss, mild muscle depletion, energy intake < 75% for > 7 days.  GOAL:   Patient will meet greater than or equal to 90% of their needs  MONITOR:   PO intake, I & O's, Weight trends, Diet advancement, Supplement acceptance, Labs  REASON FOR ASSESSMENT:   Malnutrition Screening Tool  ASSESSMENT:   Colleen Lee is a 62 y.o. female who was admitted to hospital for acute renal failure, dehydration, and intractable nausea and vomiting.  She has a history of chronic abdominal pain, chronic constipation, cirrhosis, GERD, [name], hypertension, nausea and vomiting, and a colonscopy.   Pt has lost 25 lbs over the course of 2 months (195lbs on 09/21/2017 to 170 on 11/13/2017).  Her rapid weight loss of 12.8% body weight over 2 months puts her at risk for severe malnutrition.  A nutrition focused physical exam was performed.  Pt has maintained her current weight of around 170lbs for about a month.  Pt has been experiencing nausea and vomiting for 2 weeks, but when she is feeling well pt said she eats one meal a day around dinner time which usually consists of "a full course meal".  Pt also mentioned she likes salads and drinking mountain dew of which she says she consumes approximately six 12 oz cans/day.  Pt also says she is aware that they are not  good for her health.  Pt does her own grocery shopping and cooking.  She stays at home most days and likes to work in her garden, but it is hard for her to move too much due to a past knee replacement on her left leg.  Pt mentioned she takes two different vitamins at home: vitamin B and vitamin D.  Not clarified which B vitamin supplement she is taking.     NUTRITION - FOCUSED PHYSICAL EXAM:    Most Recent Value  Orbital Region  No depletion  Upper Arm Region  No depletion  Thoracic and Lumbar Region  No depletion  Buccal Region  Mild depletion  Temple Region  Mild depletion  Clavicle Bone Region  Mild depletion  Scapular Bone Region  No depletion  Dorsal Hand  No depletion  Patellar Region  Mild depletion  Anterior Thigh Region  Moderate depletion  Posterior Calf Region  Moderate depletion  Edema (RD Assessment)  None  Hair  Reviewed  Skin  Reviewed  Nails  Reviewed       Diet Order:   Diet Order           Diet clear liquid Room service appropriate? Yes; Fluid consistency: Thin  Diet effective now          EDUCATION NEEDS:   Not appropriate for education at this time  Skin:  Skin Assessment: Reviewed RN Assessment  Last BM:  5/15  Height:   Ht Readings from Last 1 Encounters:  12/15/17 5\' 6"  (1.676  m)    Weight:   Wt Readings from Last 1 Encounters:  12/16/17 171 lb 8.3 oz (77.8 kg)    Ideal Body Weight:  59 kg  BMI:  Body mass index is 27.68 kg/m.  Estimated Nutritional Needs:   Kcal:  1760-1900kcal (MSJ*1.3-1.4 AF)  Protein:  78-101g (1-1.3g/kg ABW)  Fluid:  1760-1967mL (85mL/kcal)

## 2017-12-17 DIAGNOSIS — G43A1 Cyclical vomiting, intractable: Secondary | ICD-10-CM

## 2017-12-17 DIAGNOSIS — E440 Moderate protein-calorie malnutrition: Secondary | ICD-10-CM

## 2017-12-17 LAB — CBC WITH DIFFERENTIAL/PLATELET
Basophils Absolute: 0 10*3/uL (ref 0.0–0.1)
Basophils Relative: 1 %
Eosinophils Absolute: 0 10*3/uL (ref 0.0–0.7)
Eosinophils Relative: 1 %
HCT: 39 % (ref 36.0–46.0)
Hemoglobin: 13.4 g/dL (ref 12.0–15.0)
Lymphocytes Relative: 13 %
Lymphs Abs: 0.8 10*3/uL (ref 0.7–4.0)
MCH: 31.1 pg (ref 26.0–34.0)
MCHC: 34.4 g/dL (ref 30.0–36.0)
MCV: 90.5 fL (ref 78.0–100.0)
Monocytes Absolute: 0.3 10*3/uL (ref 0.1–1.0)
Monocytes Relative: 6 %
Neutro Abs: 4.7 10*3/uL (ref 1.7–7.7)
Neutrophils Relative %: 79 %
Platelets: 198 10*3/uL (ref 150–400)
RBC: 4.31 MIL/uL (ref 3.87–5.11)
RDW: 13.6 % (ref 11.5–15.5)
WBC: 5.8 10*3/uL (ref 4.0–10.5)

## 2017-12-17 LAB — COMPREHENSIVE METABOLIC PANEL
ALT: 15 U/L (ref 14–54)
AST: 17 U/L (ref 15–41)
Albumin: 4 g/dL (ref 3.5–5.0)
Alkaline Phosphatase: 49 U/L (ref 38–126)
Anion gap: 9 (ref 5–15)
BUN: 12 mg/dL (ref 6–20)
CO2: 19 mmol/L — ABNORMAL LOW (ref 22–32)
Calcium: 9.9 mg/dL (ref 8.9–10.3)
Chloride: 109 mmol/L (ref 101–111)
Creatinine, Ser: 1.04 mg/dL — ABNORMAL HIGH (ref 0.44–1.00)
GFR calc Af Amer: >60 mL/min (ref >60)
GFR calc non Af Amer: 56 mL/min — ABNORMAL LOW (ref >60)
Glucose, Bld: 110 mg/dL — ABNORMAL HIGH (ref 65–99)
Potassium: 3.8 mmol/L (ref 3.5–5.1)
Sodium: 137 mmol/L (ref 135–145)
Total Bilirubin: 1.7 mg/dL — ABNORMAL HIGH (ref 0.3–1.2)
Total Protein: 7.6 g/dL (ref 6.5–8.1)

## 2017-12-17 LAB — VITAMIN D 25 HYDROXY (VIT D DEFICIENCY, FRACTURES): Vit D, 25-Hydroxy: 15 ng/mL — ABNORMAL LOW (ref 30.0–100.0)

## 2017-12-17 LAB — MAGNESIUM: Magnesium: 1.3 mg/dL — ABNORMAL LOW (ref 1.7–2.4)

## 2017-12-17 LAB — HIV ANTIBODY (ROUTINE TESTING W REFLEX): HIV Screen 4th Generation wRfx: NONREACTIVE

## 2017-12-17 MED ORDER — HYDRALAZINE HCL 20 MG/ML IJ SOLN
10.0000 mg | Freq: Four times a day (QID) | INTRAMUSCULAR | Status: DC | PRN
  Administered 2017-12-17: 10 mg via INTRAVENOUS
  Filled 2017-12-17: qty 1

## 2017-12-17 MED ORDER — ONDANSETRON HCL 4 MG/2ML IJ SOLN
4.0000 mg | Freq: Four times a day (QID) | INTRAMUSCULAR | Status: DC | PRN
  Administered 2017-12-17 – 2017-12-19 (×2): 4 mg via INTRAVENOUS
  Filled 2017-12-17 (×2): qty 2

## 2017-12-17 MED ORDER — ENSURE ENLIVE PO LIQD
237.0000 mL | Freq: Three times a day (TID) | ORAL | Status: DC
Start: 2017-12-17 — End: 2017-12-22
  Administered 2017-12-17: 20:00:00 via ORAL
  Administered 2017-12-20 – 2017-12-22 (×7): 237 mL via ORAL

## 2017-12-17 MED ORDER — PROMETHAZINE HCL 12.5 MG PO TABS
12.5000 mg | ORAL_TABLET | Freq: Four times a day (QID) | ORAL | Status: DC | PRN
  Administered 2017-12-17 – 2017-12-18 (×2): 12.5 mg via ORAL
  Filled 2017-12-17 (×2): qty 1

## 2017-12-17 MED ORDER — MAGNESIUM SULFATE 4 GM/100ML IV SOLN
4.0000 g | Freq: Once | INTRAVENOUS | Status: AC
  Administered 2017-12-17: 4 g via INTRAVENOUS
  Filled 2017-12-17: qty 100

## 2017-12-17 MED ORDER — LISINOPRIL 10 MG PO TABS
10.0000 mg | ORAL_TABLET | Freq: Every day | ORAL | Status: DC
  Administered 2017-12-17 – 2017-12-18 (×2): 10 mg via ORAL
  Filled 2017-12-17 (×2): qty 1

## 2017-12-17 MED ORDER — HYDROCODONE-ACETAMINOPHEN 5-325 MG PO TABS
1.0000 | ORAL_TABLET | Freq: Four times a day (QID) | ORAL | Status: DC | PRN
  Administered 2017-12-17 – 2017-12-22 (×10): 1 via ORAL
  Filled 2017-12-17 (×10): qty 1

## 2017-12-17 NOTE — Progress Notes (Addendum)
   12/17/17 1908  Vitals  BP (!) 220/130  BP Location Right Arm  BP Method Manual  Patient Position (if appropriate) Lying  Mid-level notified of VS (BP, pulse, sats) and patient c/o of recent onset of headache 6/10.

## 2017-12-17 NOTE — Progress Notes (Signed)
PROGRESS NOTE    Colleen Lee  [id number]:[id number]  DOB: 06-12-1956  DOA: 12/15/2017 PCP: Rosita Fire, MD   Brief Admission Hx:  Colleen Lee is a 62 y.o. female with history of chronic abdominal pain chronic nausea and vomiting presented to her PCP office earlier today with complaints of 2 weeks of nausea and vomiting.  The patient was noted to be hypotensive and was sent to the emergency department for further evaluation.  The patient was noted to be hypotensive with a systolic blood pressure reading in the 90s.  MDM/Assessment & Plan:   1. Acute renal failure-Improving. Secondary to prerenal causes as the patient was dehydrated.  Treating with IV fluid hydration.  Continue IV fluid hydration.  Follow electrolytes.  Recheck BMP in the morning.  Avoid nephrotoxic agents.  Renally dose medications as appropriate. 2. Hypotension-Improved. Secondary to dehydration, holding home blood pressure medications at this time and following.  She is responding to IV fluid hydration and blood pressures are improving.  Monitor on telemetry. 3. Intractable nausea and vomiting-this is a chronic problem that the patient has been dealing with and following with GI outpatient. GI saw her this morning and ordered some testing and planning GES on Monday May 20.  See new orders for nausea.   4. Hypomagnesemia - replacement ordered, recheck in AM.    5. Chronic abdominal pain-patient is being worked up outpatient with GI.  She recently had a mesenteric vascular ultrasound: The blood vessels were reportedly open but there is some notation of the median arcuate ligament that may be pressing on 1 of the arteries but it is still open.   6. GERD - protonix ordered.  7. Chronic constipation - resume home medications.   8. Hepatic cirrhosis - history of treated HCV - check HIV and urine drug screen.  9. Opioid seeking behaviors - resume home norco for moderate pain, morphine ordered for severe pain.  10. Essential  hypertension - resume lisinopril 10 mg daily.   DVT Prophylaxis: lovenox Code Status: Full   Family Communication: bedside  Disposition Plan: hopeful to discharge home 5/18 if continues to improve   Subjective: Pt became acutely nauseated this morning and has been vomiting and having abdominal pain.    Objective: Vitals:   12/16/17 1341 12/16/17 1936 12/17/17 0517 12/17/17 0748  BP: 136/66  (!) 165/80   Pulse: 78  80   Resp: 17  18   Temp: 98.7 F (37.1 C)  99.6 F (37.6 C)   TempSrc: Oral  Oral   SpO2: 100% 98% 96% 97%  Weight:      Height:        Intake/Output Summary (Last 24 hours) at 12/17/2017 1040 Last data filed at 12/17/2017 0600 Gross per 24 hour  Intake 2288.33 ml  Output 850 ml  Net 1438.33 ml   Filed Weights   12/15/17 0956 12/16/17 0402  Weight: 77.1 kg (170 lb) 77.8 kg (171 lb 8.3 oz)   REVIEW OF SYSTEMS  As per history otherwise all reviewed and reported negative  Exam:  General exam: awake, alert, NAD. Cooperative.  Respiratory system: Clear. No increased work of breathing. Cardiovascular system: S1 & S2 heard, RRR. No JVD, murmurs, gallops, clicks or pedal edema. Gastrointestinal system: Abdomen is nondistended, soft and generalized tenderness noted, no guarding, no masses palpated. Normal bowel sounds heard. Central nervous system: Alert and oriented. No focal neurological deficits. Extremities: no CCE.  Data Reviewed: Basic Metabolic Panel: Recent Labs  Lab 12/15/17 1307  12/16/17 0458 12/17/17 0736  NA 132* 133* 137  K 3.3* 4.0 3.8  CL 97* 106 109  CO2 22 20* 19*  GLUCOSE 108* 92 110*  BUN 48* 31* 12  CREATININE 3.53* 1.59* 1.04*  CALCIUM 10.7* 9.8 9.9  MG  --  2.0 1.3*   Liver Function Tests: Recent Labs  Lab 12/15/17 1307 12/16/17 0458 12/17/17 0736  AST 15 13* 17  ALT 16 15 15   ALKPHOS 56 49 49  BILITOT 1.5* 1.6* 1.7*  PROT 8.2* 7.0 7.6  ALBUMIN 4.4 3.8 4.0   Recent Labs  Lab 12/15/17 1307  LIPASE 29   No  results for input(s): AMMONIA in the last 168 hours. CBC: Recent Labs  Lab 12/15/17 1150 12/16/17 0458 12/17/17 0736  WBC 9.2 5.7 5.8  NEUTROABS 6.6 3.7 4.7  HGB 14.5 12.9 13.4  HCT 42.5 37.5 39.0  MCV 90.6 89.9 90.5  PLT 226 214 198   Cardiac Enzymes: No results for input(s): CKTOTAL, CKMB, CKMBINDEX, TROPONINI in the last 168 hours. CBG (last 3)  No results for input(s): GLUCAP in the last 72 hours. No results found for this or any previous visit (from the past 240 hour(s)).   Studies: US Renal  Result Date: 12/16/2017 CLINICAL DATA:  Acute renal failure. EXAM: RENAL / URINARY TRACT ULTRASOUND COMPLETE COMPARISON:  CT, 10/07/2016 FINDINGS: Right Kidney: Length: 11.1 cm. Slight prominence of the right renal pelvis, but no convincing hydronephrosis. No renal masses or stones. Left Kidney: Length: 10.9 cm. Echogenicity within normal limits. No mass or hydronephrosis visualized. Bladder: Appears normal for degree of bladder distention. IMPRESSION: Normal renal ultrasound. Electronically Signed   By: Lajean Manes M.D.   On: 12/16/2017 12:05   Dg Abd Acute W/chest  Result Date: 12/15/2017 CLINICAL DATA:  Abdominal pain.  Nausea and vomiting for 2 weeks. EXAM: DG ABDOMEN ACUTE W/ 1V CHEST COMPARISON:  11/13/2017 FINDINGS: Heart size is normal. The lungs are free of focal consolidations and pleural effusions. There is no pulmonary edema. Mildly prominent interstitial markings appear stable. There is no free air beneath the diaphragm. Bowel gas pattern is nonobstructed. Calcifications in the pelvis are consistent with uterine fibroids. There are degenerative changes in the hips. IMPRESSION: Negative abdominal radiographs. No acute cardiopulmonary disease. Calcified uterine fibroids. Electronically Signed   By: Nolon Nations M.D.   On: 12/15/2017 12:39   Scheduled Meds: . aspirin  81 mg Oral Daily  . DULoxetine  30 mg Oral Daily  . enoxaparin (LOVENOX) injection  40 mg Subcutaneous Q24H    . feeding supplement  1 Container Oral TID BM  . feeding supplement (ENSURE ENLIVE)  237 mL Oral TID BM  . mometasone-formoterol  2 puff Inhalation BID  . nicotine  14 mg Transdermal Daily  . ondansetron  4 mg Oral TID AC & HS  . pantoprazole  40 mg Oral BID AC   Continuous Infusions: . 0.9 % NaCl with KCl 20 mEq / L 50 mL/hr at 12/17/17 0856  . magnesium sulfate 1 - 4 g bolus IVPB      Principal Problem:   Acute renal failure (ARF) (HCC) Active Problems:   Nausea with vomiting   Abdominal pain, epigastric   History of Helicobacter pylori infection   Hepatic cirrhosis (HCC)   Alcoholic cirrhosis of liver without ascites (HCC)   Intractable nausea and vomiting   Essential hypertension   GERD (gastroesophageal reflux disease)   Chronic abdominal pain   Malnutrition of moderate degree  Time  spent:   Irwin Brakeman, MD, FAAFP [facility] Pager [phone]  If 7PM-7AM, please contact night-coverage [email or web address] Password [email or web address] 12/17/2017, 10:40 AM    LOS: 2 days

## 2017-12-17 NOTE — Progress Notes (Signed)
Patient ID: Colleen Lee, female   DOB: October 23, 1955, 62 y.o.   MRN: [id number]   Assessment/Plan: ADMITTED WITH NAUSEA AND VOMITING. IMPROVED MAY 17 AD NOW HAVING DRY HEAVES. ETIOLOGY FOR EPISODIC NAUSEA AND VOMITING UNCLEAR. PT LAST SMOKED THC "2 WEEKS AGO" REPOTS SHE SMOKES THC 2X/MONTH.  DIFFERENTIAL DIAGNOSIS INCLUDES: CANNABIS HYPEREMESIS SYNDROME, GASTROPARESIS, LESS LIKELY ADRENAL INSUFFICIENCY OR THYROID DISTURBANCE.  PLAN: 1. CHECK FASTING CORTISOL, TSH, AND HGA1C IN AM 2. ZOFRAN ATC AND Q6H PRN. CHANGE PHENERGAN TO Q6H PRN. DOSE OF PHENERGAN NOW. DISCUSSED WITH PRIMARY NURSE 3. GES ON MON MAY 20. RADIOLOGY AWARE. 4. PT AWARE THC CAN GENERATE NAUSEA AND VOMITING.  5. STOP NONESSENTIAL PO MEDS: MEGACE, FLORASTOR, TRULANCE. 6. FULL LIQUID DIET. CONTINUE MIVFs.   Subjective: Since I last evaluated the patient SHE WAS TOLERATING POs YESTERDAY BUT THIS AM STARTED HAVING DRY HEAVES @5  AM. FEELS WORSE SINCE YESTERDAY. DENIES ANYONE TOLD HER THAT MARIJUANA(THC) CAN CAUSE NAUSEA/VOMITING.   Objective: Vital signs in last 24 hours: Vitals:   12/17/17 0517 12/17/17 0748  BP: (!) 165/80   Pulse: 80   Resp: 18   Temp: 99.6 F (37.6 C)   SpO2: 96% 97%   General appearance: alert, cooperative and mild distress Resp: clear to auscultation bilaterally Cardio: regular rate and rhythm GI: soft, MILDLY tender IN PERIUMBILICAL REGION, NO REBOUND OR GUARDING; bowel sounds normal;  NEURO: NO NEW FOCAL DEFICITS PSYCH: ALERT, ORIENTED, AND FLAT AFFECT  Lab Results:  CMP PENDING, Hb 13.4 PLT CT 198   Studies/Results: US Renal  Result Date: 12/16/2017 CLINICAL DATA:  Acute renal failure. EXAM: RENAL / URINARY TRACT ULTRASOUND COMPLETE COMPARISON:  CT, 10/07/2016 FINDINGS: Right Kidney: Length: 11.1 cm. Slight prominence of the right renal pelvis, but no convincing hydronephrosis. No renal masses or stones. Left Kidney: Length: 10.9 cm. Echogenicity within normal limits. No mass or  hydronephrosis visualized. Bladder: Appears normal for degree of bladder distention. IMPRESSION: Normal renal ultrasound. Electronically Signed   By: Lajean Manes M.D.   On: 12/16/2017 12:05    Medications: I have reviewed the patient's current medications.

## 2017-12-18 ENCOUNTER — Inpatient Hospital Stay (HOSPITAL_COMMUNITY): Payer: Medicare HMO

## 2017-12-18 ENCOUNTER — Encounter (HOSPITAL_COMMUNITY): Payer: Self-pay | Admitting: Radiology

## 2017-12-18 DIAGNOSIS — G43A0 Cyclical vomiting, not intractable: Secondary | ICD-10-CM

## 2017-12-18 LAB — T4, FREE: Free T4: 0.93 ng/dL (ref 0.82–1.77)

## 2017-12-18 LAB — CBC WITH DIFFERENTIAL/PLATELET
Basophils Absolute: 0 10*3/uL (ref 0.0–0.1)
Basophils Relative: 0 %
Eosinophils Absolute: 0 10*3/uL (ref 0.0–0.7)
Eosinophils Relative: 0 %
HCT: 44.1 % (ref 36.0–46.0)
Hemoglobin: 15.7 g/dL — ABNORMAL HIGH (ref 12.0–15.0)
Lymphocytes Relative: 5 %
Lymphs Abs: 0.7 10*3/uL (ref 0.7–4.0)
MCH: 31.3 pg (ref 26.0–34.0)
MCHC: 35.6 g/dL (ref 30.0–36.0)
MCV: 87.8 fL (ref 78.0–100.0)
Monocytes Absolute: 1.2 10*3/uL — ABNORMAL HIGH (ref 0.1–1.0)
Monocytes Relative: 8 %
Neutro Abs: 12.3 10*3/uL — ABNORMAL HIGH (ref 1.7–7.7)
Neutrophils Relative %: 87 %
Platelets: 176 10*3/uL (ref 150–400)
RBC: 5.02 MIL/uL (ref 3.87–5.11)
RDW: 13.6 % (ref 11.5–15.5)
WBC: 14.2 10*3/uL — ABNORMAL HIGH (ref 4.0–10.5)

## 2017-12-18 LAB — COMPREHENSIVE METABOLIC PANEL
ALT: 17 U/L (ref 14–54)
AST: 20 U/L (ref 15–41)
Albumin: 4.9 g/dL (ref 3.5–5.0)
Alkaline Phosphatase: 67 U/L (ref 38–126)
Anion gap: 15 (ref 5–15)
BUN: 16 mg/dL (ref 6–20)
CO2: 20 mmol/L — ABNORMAL LOW (ref 22–32)
Calcium: 11.2 mg/dL — ABNORMAL HIGH (ref 8.9–10.3)
Chloride: 96 mmol/L — ABNORMAL LOW (ref 101–111)
Creatinine, Ser: 1.68 mg/dL — ABNORMAL HIGH (ref 0.44–1.00)
GFR calc Af Amer: 37 mL/min — ABNORMAL LOW (ref >60)
GFR calc non Af Amer: 32 mL/min — ABNORMAL LOW (ref >60)
Glucose, Bld: 121 mg/dL — ABNORMAL HIGH (ref 65–99)
Potassium: 3.5 mmol/L (ref 3.5–5.1)
Sodium: 131 mmol/L — ABNORMAL LOW (ref 135–145)
Total Bilirubin: 3 mg/dL — ABNORMAL HIGH (ref 0.3–1.2)
Total Protein: 9.4 g/dL — ABNORMAL HIGH (ref 6.5–8.1)

## 2017-12-18 LAB — MAGNESIUM: Magnesium: 2.4 mg/dL (ref 1.7–2.4)

## 2017-12-18 LAB — HEMOGLOBIN A1C
Hgb A1c MFr Bld: 5.4 % (ref 4.8–5.6)
Mean Plasma Glucose: 108.28 mg/dL

## 2017-12-18 LAB — CORTISOL-AM, BLOOD: Cortisol - AM: 39 ug/dL — ABNORMAL HIGH (ref 6.7–22.6)

## 2017-12-18 LAB — TSH: TSH: 0.143 u[IU]/mL — ABNORMAL LOW (ref 0.350–4.500)

## 2017-12-18 MED ORDER — SODIUM CHLORIDE 0.9 % IV SOLN
INTRAVENOUS | Status: AC
  Administered 2017-12-18 – 2017-12-19 (×2): via INTRAVENOUS

## 2017-12-18 MED ORDER — AMLODIPINE BESYLATE 5 MG PO TABS
10.0000 mg | ORAL_TABLET | Freq: Every day | ORAL | Status: DC
  Administered 2017-12-18 – 2017-12-22 (×5): 10 mg via ORAL
  Filled 2017-12-18 (×5): qty 2

## 2017-12-18 NOTE — Progress Notes (Signed)
PROGRESS NOTE    Colleen Lee  WNU:[id number]  DOB: Dec 12, 1955  DOA: 12/15/2017 PCP: Rosita Fire, MD   Brief Admission Hx:  Colleen Lee is a 62 y.o. female with history of chronic abdominal pain chronic nausea and vomiting presented to her PCP office earlier today with complaints of 2 weeks of nausea and vomiting.  The patient was noted to be hypotensive and was sent to the emergency department for further evaluation.  The patient was noted to be hypotensive with a systolic blood pressure reading in the 90s.  MDM/Assessment & Plan:   1. Acute renal failure-Improving. Secondary to prerenal causes as the patient was dehydrated.  restarting IV fluid hydration.    Follow electrolytes.  Recheck BMP in the morning.  Avoid nephrotoxic agents.  Renally dose medications as appropriate. 2. Hypotension-resolved now. Secondary to dehydration.  She responded to IV fluid hydration and blood pressures has rebounded.  Monitor on telemetry. 3. Intractable nausea and vomiting-this is a chronic problem that the patient has been dealing with and following with GI outpatient. GI saw her this morning and ordered some testing and planning GES on Monday May 20.  See new orders for nausea.   4. Hypomagnesemia - repleted.    5. Chronic abdominal pain-patient is being worked up outpatient with GI.  She recently had a mesenteric vascular ultrasound: The blood vessels were reportedly open but there is some notation of the median arcuate ligament that may be pressing on 1 of the arteries but it is still open.  GES ordered fro 5/20.    6. GERD - protonix ordered.  7. Chronic constipation - resume home medications.   8. Hepatic cirrhosis - history of treated HCV.  9. Opioid seeking behaviors - resume home norco for moderate pain, morphine ordered for severe pain.  10. Essential hypertension - hold lisinopril due to bump in creatinine.  Hydralazine / amlodipine ordered for BP.   11. Leukocytosis - question if from  wreching and emesis yesterday, will check CXR, urinalysis today. Recheck in AM with differential.   DVT Prophylaxis: lovenox Code Status: Full   Family Communication: bedside  Disposition Plan: hopeful to discharge home 5/18 if continues to improve  Subjective: Pt says she feels a lot better today. Her abdominal pain is better and she wants to eat and drink this morning.  She has not vomited today.      Objective: Vitals:   12/17/17 1908 12/17/17 1945 12/17/17 2004 12/18/17 0447  BP: (!) 220/130 (!) 146/86  (!) 182/119  Pulse:  82  98  Resp:    18  Temp:  98.4 F (36.9 C)  98.6 F (37 C)  TempSrc:  Oral  Oral  SpO2:  99% 99% 95%  Weight:    72.3 kg (159 lb 6.3 oz)  Height:       No intake or output data in the 24 hours ending 12/18/17 1034 Filed Weights   12/15/17 0956 12/16/17 0402 12/18/17 0447  Weight: 77.1 kg (170 lb) 77.8 kg (171 lb 8.3 oz) 72.3 kg (159 lb 6.3 oz)   REVIEW OF SYSTEMS  As per history otherwise all reviewed and reported negative  Exam:  General exam: awake, alert, NAD. Cooperative.  Respiratory system: Clear. No increased work of breathing. Cardiovascular system: S1 & S2 heard, RRR. No JVD, murmurs, gallops, clicks or pedal edema. Gastrointestinal system: Abdomen is nondistended, soft and generalized tenderness noted, no guarding, no masses palpated. Normal bowel sounds heard. Central nervous system: Alert  and oriented. No focal neurological deficits. Extremities: no CCE.  Data Reviewed: Basic Metabolic Panel: Recent Labs  Lab 12/15/17 1307 12/16/17 0458 12/17/17 0736 12/18/17 0734  NA 132* 133* 137 131*  K 3.3* 4.0 3.8 3.5  CL 97* 106 109 96*  CO2 22 20* 19* 20*  GLUCOSE 108* 92 110* 121*  BUN 48* 31* 12 16  CREATININE 3.53* 1.59* 1.04* 1.68*  CALCIUM 10.7* 9.8 9.9 11.2*  MG  --  2.0 1.3* 2.4   Liver Function Tests: Recent Labs  Lab 12/15/17 1307 12/16/17 0458 12/17/17 0736 12/18/17 0734  AST 15 13* 17 20  ALT 16 15 15 17     ALKPHOS 56 49 49 67  BILITOT 1.5* 1.6* 1.7* 3.0*  PROT 8.2* 7.0 7.6 9.4*  ALBUMIN 4.4 3.8 4.0 4.9   Recent Labs  Lab 12/15/17 1307  LIPASE 29   No results for input(s): AMMONIA in the last 168 hours. CBC: Recent Labs  Lab 12/15/17 1150 12/16/17 0458 12/17/17 0736 12/18/17 0734  WBC 9.2 5.7 5.8 14.2*  NEUTROABS 6.6 3.7 4.7 12.3*  HGB 14.5 12.9 13.4 15.7*  HCT 42.5 37.5 39.0 44.1  MCV 90.6 89.9 90.5 87.8  PLT 226 214 198 176   Cardiac Enzymes: No results for input(s): CKTOTAL, CKMB, CKMBINDEX, TROPONINI in the last 168 hours. CBG (last 3)  No results for input(s): GLUCAP in the last 72 hours. No results found for this or any previous visit (from the past 240 hour(s)).   Studies: US Renal  Result Date: 12/16/2017 CLINICAL DATA:  Acute renal failure. EXAM: RENAL / URINARY TRACT ULTRASOUND COMPLETE COMPARISON:  CT, 10/07/2016 FINDINGS: Right Kidney: Length: 11.1 cm. Slight prominence of the right renal pelvis, but no convincing hydronephrosis. No renal masses or stones. Left Kidney: Length: 10.9 cm. Echogenicity within normal limits. No mass or hydronephrosis visualized. Bladder: Appears normal for degree of bladder distention. IMPRESSION: Normal renal ultrasound. Electronically Signed   By: Lajean Manes M.D.   On: 12/16/2017 12:05   Scheduled Meds: . amLODipine  10 mg Oral Daily  . aspirin  81 mg Oral Daily  . DULoxetine  30 mg Oral Daily  . enoxaparin (LOVENOX) injection  40 mg Subcutaneous Q24H  . feeding supplement  1 Container Oral TID BM  . feeding supplement (ENSURE ENLIVE)  237 mL Oral TID BM  . mometasone-formoterol  2 puff Inhalation BID  . nicotine  14 mg Transdermal Daily  . ondansetron  4 mg Oral TID AC & HS  . pantoprazole  40 mg Oral BID AC   Continuous Infusions: . sodium chloride      Principal Problem:   Acute renal failure (ARF) (HCC) Active Problems:   Nausea with vomiting   Abdominal pain, epigastric   History of Helicobacter pylori  infection   Hepatic cirrhosis (HCC)   Alcoholic cirrhosis of liver without ascites (HCC)   Intractable nausea and vomiting   Essential hypertension   GERD (gastroesophageal reflux disease)   Chronic abdominal pain   Malnutrition of moderate degree  Time spent:   Irwin Brakeman, MD, FAAFP [facility] Pager [phone]  If 7PM-7AM, please contact night-coverage [email or web address] Password [email or web address] 12/18/2017, 10:34 AM    LOS: 3 days

## 2017-12-18 NOTE — H&P (View-Only) (Signed)
Patient ID: Colleen Lee, female   DOB: 10/16/1955, 62 y.o.   MRN: [id number]    Assessment/Plan: ADMITTED WITH NAUSEA/VOMITING. SYMPTOMS IMPROVED OVER PAST 24 HRS. WANTS TO EAT. TSH SUGGESTS PT IS HYPERTHYROID.  PLAN: 1. REFER TO ENDOCRINOLOGY AS AN OUTPT. 2. ADVANCE DIET. NPO AFTER MN FOR GES MAY 20. DISCUSSED PROCEDURE, AND BENEFITS OF PROCEDURE. 3. CONTINUE MIVFs. 4. PROTONIX BID. 5. CONTINUE ZOFRAN ATC AND PHENERGAN/ZOFRAN PRN.    Subjective: Since I last evaluated the patient SHE HAD MULTIPLE EPISODE OF DRY HEAVES/VOMITING. C/O ABODMINALPAIN. 2 LOOSE STOOLS OVER PAST 24 HRS.   Objective: Vital signs in last 24 hours: Vitals:   12/17/17 2004 12/18/17 0447  BP:  (!) 182/119  Pulse:  98  Resp:  18  Temp:  98.6 F (37 C)  SpO2: 99% 95%   General appearance: alert, cooperative and no distress Resp: clear to auscultation bilaterally Cardio: regular rate and rhythm GI: soft, MILDLY tender x4; bowel sounds normal;   Lab Results:  Cr 1.68 K 3.5   Studies/Results: No results found.  Medications: I have reviewed the patient's current medications.

## 2017-12-18 NOTE — Progress Notes (Signed)
Patient ID: Colleen Lee, female   DOB: November 07, 1955, 62 y.o.   MRN: [id number]    Assessment/Plan: ADMITTED WITH NAUSEA/VOMITING. SYMPTOMS IMPROVED OVER PAST 24 HRS. WANTS TO EAT. TSH SUGGESTS PT IS HYPERTHYROID.  PLAN: 1. REFER TO ENDOCRINOLOGY AS AN OUTPT. 2. ADVANCE DIET. NPO AFTER MN FOR GES MAY 20. DISCUSSED PROCEDURE, AND BENEFITS OF PROCEDURE. 3. CONTINUE MIVFs. 4. PROTONIX BID. 5. CONTINUE ZOFRAN ATC AND PHENERGAN/ZOFRAN PRN.    Subjective: Since I last evaluated the patient SHE HAD MULTIPLE EPISODE OF DRY HEAVES/VOMITING. C/O ABODMINALPAIN. 2 LOOSE STOOLS OVER PAST 24 HRS.   Objective: Vital signs in last 24 hours: Vitals:   12/17/17 2004 12/18/17 0447  BP:  (!) 182/119  Pulse:  98  Resp:  18  Temp:  98.6 F (37 C)  SpO2: 99% 95%   General appearance: alert, cooperative and no distress Resp: clear to auscultation bilaterally Cardio: regular rate and rhythm GI: soft, MILDLY tender x4; bowel sounds normal;   Lab Results:  Cr 1.68 K 3.5   Studies/Results: No results found.  Medications: I have reviewed the patient's current medications.

## 2017-12-19 ENCOUNTER — Inpatient Hospital Stay (HOSPITAL_COMMUNITY): Payer: Medicare HMO

## 2017-12-19 ENCOUNTER — Encounter (HOSPITAL_COMMUNITY): Payer: Self-pay | Admitting: Gastroenterology

## 2017-12-19 DIAGNOSIS — D72829 Elevated white blood cell count, unspecified: Secondary | ICD-10-CM

## 2017-12-19 LAB — COMPREHENSIVE METABOLIC PANEL
ALT: 21 U/L (ref 14–54)
AST: 22 U/L (ref 15–41)
Albumin: 4.2 g/dL (ref 3.5–5.0)
Alkaline Phosphatase: 56 U/L (ref 38–126)
Anion gap: 11 (ref 5–15)
BUN: 20 mg/dL (ref 6–20)
CO2: 20 mmol/L — ABNORMAL LOW (ref 22–32)
Calcium: 10.2 mg/dL (ref 8.9–10.3)
Chloride: 100 mmol/L — ABNORMAL LOW (ref 101–111)
Creatinine, Ser: 1.39 mg/dL — ABNORMAL HIGH (ref 0.44–1.00)
GFR calc Af Amer: 46 mL/min — ABNORMAL LOW (ref >60)
GFR calc non Af Amer: 40 mL/min — ABNORMAL LOW (ref >60)
Glucose, Bld: 119 mg/dL — ABNORMAL HIGH (ref 65–99)
Potassium: 3.6 mmol/L (ref 3.5–5.1)
Sodium: 131 mmol/L — ABNORMAL LOW (ref 135–145)
Total Bilirubin: 2.8 mg/dL — ABNORMAL HIGH (ref 0.3–1.2)
Total Protein: 8 g/dL (ref 6.5–8.1)

## 2017-12-19 LAB — CBC WITH DIFFERENTIAL/PLATELET
Basophils Absolute: 0 10*3/uL (ref 0.0–0.1)
Basophils Relative: 0 %
Eosinophils Absolute: 0 10*3/uL (ref 0.0–0.7)
Eosinophils Relative: 0 %
HCT: 42.5 % (ref 36.0–46.0)
Hemoglobin: 15.2 g/dL — ABNORMAL HIGH (ref 12.0–15.0)
Lymphocytes Relative: 7 %
Lymphs Abs: 0.8 10*3/uL (ref 0.7–4.0)
MCH: 31.5 pg (ref 26.0–34.0)
MCHC: 35.8 g/dL (ref 30.0–36.0)
MCV: 88.2 fL (ref 78.0–100.0)
Monocytes Absolute: 1.1 10*3/uL — ABNORMAL HIGH (ref 0.1–1.0)
Monocytes Relative: 9 %
Neutro Abs: 10.2 10*3/uL — ABNORMAL HIGH (ref 1.7–7.7)
Neutrophils Relative %: 84 %
Platelets: 124 10*3/uL — ABNORMAL LOW (ref 150–400)
RBC: 4.82 MIL/uL (ref 3.87–5.11)
RDW: 13.5 % (ref 11.5–15.5)
WBC: 12.1 10*3/uL — ABNORMAL HIGH (ref 4.0–10.5)

## 2017-12-19 LAB — MAGNESIUM: Magnesium: 1.9 mg/dL (ref 1.7–2.4)

## 2017-12-19 MED ORDER — METOPROLOL TARTRATE 25 MG PO TABS
25.0000 mg | ORAL_TABLET | Freq: Two times a day (BID) | ORAL | Status: DC
  Administered 2017-12-19 – 2017-12-22 (×7): 25 mg via ORAL
  Filled 2017-12-19 (×8): qty 1

## 2017-12-19 MED ORDER — POLYETHYLENE GLYCOL 3350 17 G PO PACK
17.0000 g | PACK | Freq: Two times a day (BID) | ORAL | Status: DC
  Filled 2017-12-19 (×2): qty 1

## 2017-12-19 MED ORDER — HYDRALAZINE HCL 10 MG PO TABS
10.0000 mg | ORAL_TABLET | Freq: Three times a day (TID) | ORAL | Status: DC
  Administered 2017-12-19 – 2017-12-22 (×11): 10 mg via ORAL
  Filled 2017-12-19 (×11): qty 1

## 2017-12-19 MED ORDER — SODIUM CHLORIDE 0.9 % IV SOLN
INTRAVENOUS | Status: DC
  Administered 2017-12-19: 10:00:00 via INTRAVENOUS

## 2017-12-19 NOTE — Progress Notes (Signed)
    Subjective: Patient getting into wheelchair when I came into room. GES this morning. Sister at bedside stating multiple episodes of N/V overnight. NPO for GES today.   Objective: Vital signs in last 24 hours: Temp:  [98.6 F (37 C)-99.2 F (37.3 C)] 98.6 F (37 C) (05/20 0446) Pulse Rate:  [100-114] 100 (05/20 0446) Resp:  [18] 18 (05/20 0446) BP: (138-180)/(93-112) 180/112 (05/20 0446) SpO2:  [97 %-99 %] 99 % (05/20 0446) Weight:  [161 lb 2.5 oz (73.1 kg)] 161 lb 2.5 oz (73.1 kg) (05/20 0446) Last BM Date: 12/14/17 General:   Alert and oriented, pleasant Head:  Normocephalic and atraumatic. Abdomen:  Limited exam as patient in chair and getting ready to leave. BS present.  Extremities:  Without  edema. Neurologic:  Alert and  oriented x4 Psych:   Flat affect.   Intake/Output from previous day: 05/19 0701 - 05/20 0700 In: 208.3 [I.V.:208.3] Out: -  Intake/Output this shift: No intake/output data recorded.  Lab Results: Recent Labs    12/17/17 0736 12/18/17 0734 12/19/17 0600  WBC 5.8 14.2* 12.1*  HGB 13.4 15.7* 15.2*  HCT 39.0 44.1 42.5  PLT 198 176 124*   BMET Recent Labs    12/17/17 0736 12/18/17 0734 12/19/17 0600  NA 137 131* 131*  K 3.8 3.5 3.6  CL 109 96* 100*  CO2 19* 20* 20*  GLUCOSE 110* 121* 119*  BUN 12 16 20   CREATININE 1.04* 1.68* 1.39*  CALCIUM 9.9 11.2* 10.2   LFT Recent Labs    12/17/17 0736 12/18/17 0734 12/19/17 0600  PROT 7.6 9.4* 8.0  ALBUMIN 4.0 4.9 4.2  AST 17 20 22   ALT 15 17 21   ALKPHOS 49 67 56  BILITOT 1.7* 3.0* 2.8*    Studies/Results: Dg Chest 2 View  Result Date: 12/18/2017 CLINICAL DATA:  Nausea and generalized abdominal pain EXAM: CHEST - 2 VIEW COMPARISON:  Chest radiograph 12/15/2017 FINDINGS: Monitoring leads overlie the patient. Stable cardiac and mediastinal contours. No consolidative pulmonary opacities. No pleural effusion or pneumothorax. Thoracic spine degenerative changes. IMPRESSION: No acute  cardiopulmonary process. Electronically Signed   By: Lovey Newcomer M.D.   On: 12/18/2017 13:39    Assessment: 62 year old female with history of cirrhosis secondary to ETOH, history of HCV s/p treatment and achieving SVR, admitted with N/V, acute on chronic abdominal pain, diarrhea. 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   Due to worsening N/V, GES ordered for today. I was able to briefly see patient prior to transport taking her downstairs for GES. No change in N/V. Sister at bedside. Will await GES findings. May need EGD while inpatient but will await findings.     Plan: PPI BID Zofran scheduled and Phenergan, Zofran prn GES today: further recommendations to follow Outpatient referral to endocrinology due to low TSH  Outpatient follow-up to arrange screening EGD and surveillance colonoscopy (variceal screening and history of polyps) RUQ [place] due in Aug 2019 Follow-up in office in next few weeks due to chronic vascular issues: may need referral to vascular  Annitta Needs, PhD, ANP-BC [facility]        LOS: 4 days    12/19/2017, 8:02 AM

## 2017-12-19 NOTE — Care Management Important Message (Signed)
Important Message  Patient Details  Name: ALLIAH BOULANGER MRN: [id number] Date of Birth: [birth year]   Medicare Important Message Given:  Yes    [name] 12/19/2017, 11:30 AM

## 2017-12-19 NOTE — Progress Notes (Signed)
PROGRESS NOTE    Colleen Lee  ASN:[id number]  DOB: 09-15-1955  DOA: 12/15/2017 PCP: Rosita Fire, MD   Brief Admission Hx:  Colleen Lee is a 62 y.o. female with history of chronic abdominal pain chronic nausea and vomiting presented to her PCP office earlier today with complaints of 2 weeks of nausea and vomiting.  The patient was noted to be hypotensive and was sent to the emergency department for further evaluation.  The patient was noted to be hypotensive with a systolic blood pressure reading in the 90s.  MDM/Assessment & Plan:   1. Acute renal failure-Improving. Secondary to prerenal causes as the patient was dehydrated.  restarting IV fluid hydration.    Follow electrolytes.  Recheck BMP in the morning.  Avoid nephrotoxic agents.  Renally dose medications as appropriate. 2. Hypotension-resolved now. Secondary to dehydration.  She responded to IV fluid hydration and blood pressures has rebounded.  3. Intractable nausea and vomiting-this is a chronic problem that the patient has been dealing with and following with GI outpatient. GI saw her this morning and ordered some testing and planning GES on Monday May 20.  See new orders for nausea. Further recommendations to follow GES including if they will plan for EGD inpatient versus outpatient.  4. Hypomagnesemia - repleted.    5. Chronic abdominal pain-patient is being worked up outpatient with GI.  She recently had a mesenteric vascular ultrasound: The blood vessels were reportedly open but there is some notation of the median arcuate ligament that may be pressing on 1 of the arteries but it is still open.  GI planning outpatient referral to vascular surgery.    6. GERD - protonix ordered.  7. Hyponatremia - she is not eating/drinking well, start NS infusion. Recheck in AM.  8. Chronic constipation - miralax BID ordered.   9. Hyperbilirubinemia - likely from chronic liver disease.  I asked for GI input.  10. Hepatic cirrhosis -  history of treated HCV.  11. Opioid seeking behaviors - resumed home norco for moderate pain, morphine ordered for severe pain.  12. Essential hypertension - hold lisinopril due to bump in creatinine.  Hydralazine / amlodipine / metoprolol ordered for BP.   13. Leukocytosis - question if from wreching and emesis yesterday, will check CXR, urinalysis today. Recheck in AM with differential.  WBC is trending down.  Follow.   DVT Prophylaxis: lovenox Code Status: Full   Family Communication: bedside  Disposition Plan: hopeful to discharge home 5/18 if continues to improve  Subjective: Pt says continues to have abdominal pain.  She has some dry heaves overnight.       Objective: Vitals:   12/18/17 1927 12/18/17 2148 12/19/17 0446 12/19/17 0754  BP:  (!) 138/93 (!) 180/112   Pulse:  (!) 114 100   Resp:  18 18   Temp:  99.2 F (37.3 C) 98.6 F (37 C)   TempSrc:  Oral Oral   SpO2: 98% 97% 99% 98%  Weight:   73.1 kg (161 lb 2.5 oz)   Height:        Intake/Output Summary (Last 24 hours) at 12/19/2017 0858 Last data filed at 12/18/2017 1630 Gross per 24 hour  Intake 208.33 ml  Output -  Net 208.33 ml   Filed Weights   12/16/17 0402 12/18/17 0447 12/19/17 0446  Weight: 77.8 kg (171 lb 8.3 oz) 72.3 kg (159 lb 6.3 oz) 73.1 kg (161 lb 2.5 oz)   REVIEW OF SYSTEMS  As per history  otherwise all reviewed and reported negative  Exam:  General exam: awake, alert, NAD. Cooperative.  Respiratory system: Clear. No increased work of breathing. Cardiovascular system: S1 & S2 heard, tachycardic. No JVD, murmurs, gallops, clicks or pedal edema. Gastrointestinal system: Abdomen is nondistended, soft and generalized tenderness noted, no guarding, no masses palpated. Normal bowel sounds heard. Central nervous system: Alert and oriented. No focal neurological deficits. Extremities: no CCE.  Data Reviewed: Basic Metabolic Panel: Recent Labs  Lab 12/15/17 1307 12/16/17 0458 12/17/17 0736  12/18/17 0734 12/19/17 0600  NA 132* 133* 137 131* 131*  K 3.3* 4.0 3.8 3.5 3.6  CL 97* 106 109 96* 100*  CO2 22 20* 19* 20* 20*  GLUCOSE 108* 92 110* 121* 119*  BUN 48* 31* 12 16 20   CREATININE 3.53* 1.59* 1.04* 1.68* 1.39*  CALCIUM 10.7* 9.8 9.9 11.2* 10.2  MG  --  2.0 1.3* 2.4 1.9   Liver Function Tests: Recent Labs  Lab 12/15/17 1307 12/16/17 0458 12/17/17 0736 12/18/17 0734 12/19/17 0600  AST 15 13* 17 20 22   ALT 16 15 15 17 21   ALKPHOS 56 49 49 67 56  BILITOT 1.5* 1.6* 1.7* 3.0* 2.8*  PROT 8.2* 7.0 7.6 9.4* 8.0  ALBUMIN 4.4 3.8 4.0 4.9 4.2   Recent Labs  Lab 12/15/17 1307  LIPASE 29   No results for input(s): AMMONIA in the last 168 hours. CBC: Recent Labs  Lab 12/15/17 1150 12/16/17 0458 12/17/17 0736 12/18/17 0734 12/19/17 0600  WBC 9.2 5.7 5.8 14.2* 12.1*  NEUTROABS 6.6 3.7 4.7 12.3* 10.2*  HGB 14.5 12.9 13.4 15.7* 15.2*  HCT 42.5 37.5 39.0 44.1 42.5  MCV 90.6 89.9 90.5 87.8 88.2  PLT 226 214 198 176 124*   Cardiac Enzymes: No results for input(s): CKTOTAL, CKMB, CKMBINDEX, TROPONINI in the last 168 hours. CBG (last 3)  No results for input(s): GLUCAP in the last 72 hours. No results found for this or any previous visit (from the past 240 hour(s)).   Studies: Dg Chest 2 View  Result Date: 12/18/2017 CLINICAL DATA:  Nausea and generalized abdominal pain EXAM: CHEST - 2 VIEW COMPARISON:  Chest radiograph 12/15/2017 FINDINGS: Monitoring leads overlie the patient. Stable cardiac and mediastinal contours. No consolidative pulmonary opacities. No pleural effusion or pneumothorax. Thoracic spine degenerative changes. IMPRESSION: No acute cardiopulmonary process. Electronically Signed   By: Lovey Newcomer M.D.   On: 12/18/2017 13:39   Scheduled Meds: . amLODipine  10 mg Oral Daily  . aspirin  81 mg Oral Daily  . DULoxetine  30 mg Oral Daily  . enoxaparin (LOVENOX) injection  40 mg Subcutaneous Q24H  . feeding supplement  1 Container Oral TID BM  .  feeding supplement (ENSURE ENLIVE)  237 mL Oral TID BM  . mometasone-formoterol  2 puff Inhalation BID  . nicotine  14 mg Transdermal Daily  . ondansetron  4 mg Oral TID AC & HS  . pantoprazole  40 mg Oral BID AC   Continuous Infusions: . sodium chloride      Principal Problem:   Acute renal failure (ARF) (HCC) Active Problems:   Nausea with vomiting   Abdominal pain, epigastric   History of Helicobacter pylori infection   Hepatic cirrhosis (HCC)   Alcoholic cirrhosis of liver without ascites (HCC)   Intractable nausea and vomiting   Essential hypertension   GERD (gastroesophageal reflux disease)   Chronic abdominal pain   Malnutrition of moderate degree  Time spent:   Irwin Brakeman, MD,  FAAFP [facility] Pager [phone]  If 7PM-7AM, please contact night-coverage [email or web address] Password [email or web address] 12/19/2017, 8:58 AM    LOS: 4 days

## 2017-12-19 NOTE — Progress Notes (Addendum)
Per nursing staff, GES was unable to be completed due to vomiting. Spoke with nursing. Tolerating liquids, pills. No vomiting since attempt at GES. Will order clear liquids. NPO after midnight. Consider EGD on 5/21 vs 5/22.   I also attempted to speak to nuclear medicine, but they had left for the day. Annitta Needs, PhD, ANP-BC [facility]  2:02 PM

## 2017-12-20 ENCOUNTER — Inpatient Hospital Stay (HOSPITAL_COMMUNITY): Payer: Medicare HMO | Admitting: Anesthesiology

## 2017-12-20 ENCOUNTER — Encounter (HOSPITAL_COMMUNITY)
Admission: EM | Disposition: A | Discharge status: Home / Self Care | Payer: Self-pay | Source: Home / Self Care | Attending: Family Medicine

## 2017-12-20 ENCOUNTER — Encounter (HOSPITAL_COMMUNITY): Payer: Self-pay | Admitting: *Deleted

## 2017-12-20 DIAGNOSIS — D696 Thrombocytopenia, unspecified: Secondary | ICD-10-CM | POA: Diagnosis not present

## 2017-12-20 DIAGNOSIS — K311 Adult hypertrophic pyloric stenosis: Secondary | ICD-10-CM

## 2017-12-20 DIAGNOSIS — K222 Esophageal obstruction: Secondary | ICD-10-CM

## 2017-12-20 DIAGNOSIS — I10 Essential (primary) hypertension: Secondary | ICD-10-CM

## 2017-12-20 DIAGNOSIS — K2980 Duodenitis without bleeding: Secondary | ICD-10-CM

## 2017-12-20 HISTORY — PX: BIOPSY: SHX5522

## 2017-12-20 HISTORY — PX: ESOPHAGOGASTRODUODENOSCOPY (EGD) WITH PROPOFOL: SHX5813

## 2017-12-20 HISTORY — PX: BALLOON DILATION: SHX5330

## 2017-12-20 HISTORY — PX: ESOPHAGEAL DILATION: SHX303

## 2017-12-20 LAB — COMPREHENSIVE METABOLIC PANEL
ALT: 21 U/L (ref 14–54)
AST: 19 U/L (ref 15–41)
Albumin: 3.7 g/dL (ref 3.5–5.0)
Alkaline Phosphatase: 49 U/L (ref 38–126)
Anion gap: 10 (ref 5–15)
BUN: 20 mg/dL (ref 6–20)
CO2: 20 mmol/L — ABNORMAL LOW (ref 22–32)
Calcium: 9.6 mg/dL (ref 8.9–10.3)
Chloride: 100 mmol/L — ABNORMAL LOW (ref 101–111)
Creatinine, Ser: 1.2 mg/dL — ABNORMAL HIGH (ref 0.44–1.00)
GFR calc Af Amer: 55 mL/min — ABNORMAL LOW (ref >60)
GFR calc non Af Amer: 47 mL/min — ABNORMAL LOW (ref >60)
Glucose, Bld: 100 mg/dL — ABNORMAL HIGH (ref 65–99)
Potassium: 3.4 mmol/L — ABNORMAL LOW (ref 3.5–5.1)
Sodium: 130 mmol/L — ABNORMAL LOW (ref 135–145)
Total Bilirubin: 3 mg/dL — ABNORMAL HIGH (ref 0.3–1.2)
Total Protein: 7 g/dL (ref 6.5–8.1)

## 2017-12-20 LAB — CBC WITH DIFFERENTIAL/PLATELET
Basophils Absolute: 0 10*3/uL (ref 0.0–0.1)
Basophils Relative: 0 %
Eosinophils Absolute: 0.1 10*3/uL (ref 0.0–0.7)
Eosinophils Relative: 1 %
HCT: 38.3 % (ref 36.0–46.0)
Hemoglobin: 13.3 g/dL (ref 12.0–15.0)
Lymphocytes Relative: 16 %
Lymphs Abs: 1.4 10*3/uL (ref 0.7–4.0)
MCH: 31 pg (ref 26.0–34.0)
MCHC: 34.7 g/dL (ref 30.0–36.0)
MCV: 89.3 fL (ref 78.0–100.0)
Monocytes Absolute: 0.9 10*3/uL (ref 0.1–1.0)
Monocytes Relative: 11 %
Neutro Abs: 6.3 10*3/uL (ref 1.7–7.7)
Neutrophils Relative %: 72 %
Platelets: 85 10*3/uL — ABNORMAL LOW (ref 150–400)
RBC: 4.29 MIL/uL (ref 3.87–5.11)
RDW: 13.5 % (ref 11.5–15.5)
WBC: 8.6 10*3/uL (ref 4.0–10.5)

## 2017-12-20 SURGERY — ESOPHAGOGASTRODUODENOSCOPY (EGD) WITH PROPOFOL
Anesthesia: Monitor Anesthesia Care

## 2017-12-20 MED ORDER — SODIUM CHLORIDE 0.9 % IV SOLN: INTRAVENOUS | Status: DC

## 2017-12-20 MED ORDER — HYDROCODONE-ACETAMINOPHEN 7.5-325 MG PO TABS: 1.0000 | ORAL_TABLET | Freq: Once | ORAL | Status: DC | PRN

## 2017-12-20 MED ORDER — POTASSIUM CHLORIDE 2 MEQ/ML IV SOLN
INTRAVENOUS | Status: DC
  Administered 2017-12-20: 16:00:00 via INTRAVENOUS
  Filled 2017-12-20 (×2): qty 1000

## 2017-12-20 MED ORDER — SODIUM CHLORIDE 0.9 % IV SOLN
INTRAVENOUS | Status: DC
  Administered 2017-12-20 – 2017-12-21 (×2): via INTRAVENOUS

## 2017-12-20 MED ORDER — LACTATED RINGERS IV SOLN: INTRAVENOUS | Status: DC

## 2017-12-20 MED ORDER — PROPOFOL 10 MG/ML IV BOLUS
INTRAVENOUS | Status: DC | PRN
  Administered 2017-12-20: 40 mg via INTRAVENOUS
  Administered 2017-12-20 (×2): 20 mg via INTRAVENOUS

## 2017-12-20 MED ORDER — HYDROMORPHONE HCL 1 MG/ML IJ SOLN: 0.2500 mg | INTRAMUSCULAR | Status: DC | PRN

## 2017-12-20 MED ORDER — LACTATED RINGERS IV SOLN
INTRAVENOUS | Status: DC | PRN
  Administered 2017-12-20: 12:00:00 via INTRAVENOUS

## 2017-12-20 MED ORDER — PROMETHAZINE HCL 25 MG/ML IJ SOLN
6.2500 mg | INTRAMUSCULAR | Status: DC | PRN
Start: 2017-12-20 — End: 2017-12-20

## 2017-12-20 MED ORDER — ENOXAPARIN SODIUM 40 MG/0.4ML ~~LOC~~ SOLN: 40.0000 mg | SUBCUTANEOUS | Status: DC

## 2017-12-20 MED ORDER — POLYETHYLENE GLYCOL 3350 17 G PO PACK
17.0000 g | PACK | Freq: Every day | ORAL | Status: DC | PRN
Start: 2017-12-20 — End: 2017-12-22

## 2017-12-20 MED ORDER — POTASSIUM CHLORIDE 10 MEQ/100ML IV SOLN
10.0000 meq | INTRAVENOUS | Status: DC
  Administered 2017-12-20: 10 meq via INTRAVENOUS
  Filled 2017-12-20: qty 100

## 2017-12-20 MED ORDER — MEPERIDINE HCL 100 MG/ML IJ SOLN
6.2500 mg | INTRAMUSCULAR | Status: DC | PRN
Start: 2017-12-20 — End: 2017-12-20

## 2017-12-20 MED ORDER — PROPOFOL 500 MG/50ML IV EMUL
INTRAVENOUS | Status: DC | PRN
  Administered 2017-12-20 (×2): 125 ug/kg/min via INTRAVENOUS

## 2017-12-20 MED ORDER — ONDANSETRON HCL 4 MG/2ML IJ SOLN
4.0000 mg | Freq: Three times a day (TID) | INTRAMUSCULAR | Status: DC
  Administered 2017-12-20 – 2017-12-21 (×5): 4 mg via INTRAVENOUS
  Filled 2017-12-20 (×7): qty 2

## 2017-12-20 NOTE — Op Note (Signed)
[facility] Patient Name: Colleen Lee Procedure Date: 12/20/2017 11:56 AM MRN: [id number] Date of Birth: 11-Jul-1956 Attending MD: Barney Drain MD, MD CSN: [id number] Age: 62 Admit Type: Inpatient Procedure:                Upper GI endoscopy wilth DILATION:                            ESOPHAGUS/PYLORUS/COLD FORCEPS BIOPSY Indications:              Nausea with vomiting, Persistent vomiting of                            unknown cause Providers:                Barney Drain MD, MD, Janeece Riggers, RN, Randa Spike, Technician Referring MD:             Rosita Fire MD, MD Medicines:                Propofol per Anesthesia Complications:            No immediate complications. Estimated Blood Loss:     Estimated blood loss was minimal. Procedure:                Pre-Anesthesia Assessment:                           - Prior to the procedure, a History and Physical                            was performed, and patient medications and                            allergies were reviewed. The patient's tolerance of                            previous anesthesia was also reviewed. The risks                            and benefits of the procedure and the sedation                            options and risks were discussed with the patient.                            All questions were answered, and informed consent                            was obtained. Prior Anticoagulants: The patient has                            taken aspirin, last dose was 1 day prior to  procedure. ASA Grade Assessment: II - A patient                            with mild systemic disease. After reviewing the                            risks and benefits, the patient was deemed in                            satisfactory condition to undergo the procedure.                            After obtaining informed consent, the endoscope was                            passed  under direct vision. Throughout the                            procedure, the patient's blood pressure, pulse, and                            oxygen saturations were monitored continuously. The                            EG29-[id number] ([id number]) scope was introduced through the                            mouth, and advanced to the second part of duodenum.                            The upper GI endoscopy was accomplished without                            difficulty. The patient tolerated the procedure                            well. Scope In: 12:13:52 PM Scope Out: 12:29:28 PM Total Procedure Duration: 0 hours 15 minutes 36 seconds  Findings:      One benign-appearing, intrinsic moderate (circumferential scarring or       stenosis; an endoscope may pass) stenosis was found. This stenosis       measured 1.3 cm (inner diameter). The stenosis was traversed. A       guidewire was placed and the scope was withdrawn. Dilation was performed       with a Savary dilator with mild resistance at 16 mm and 17 mm.      A benign-appearing, intrinsic mild stenosis was found at the pylorus.       This was traversed. A TTS dilator was passed through the scope. Dilation       with a 12-13.5-15 mm pyloric balloon dilator was performed. Biopsies       were taken with a cold forceps for histology. Estimated blood loss was       minimal.      Localized mild inflammation characterized by congestion (edema) and  erythema was found in the duodenal bulb. Biopsies were taken with a cold       forceps for histology(2: BULB).      The second portion of the duodenum was normal. Biopsies were taken with       a cold forceps for histology(4: 2ND PORTION).      Patchy moderate inflammation characterized by congestion (edema),       erosions and erythema was found in the gastric body and in the gastric       antrum. Biopsies were taken with a cold forceps for histology(3: ANTRUM,       2 BODY). Impression:                - Benign-appearing esophageal stenosis. Dilated.                           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                            Dilated. Biopsied.                           - MILD Duodenitis. Biopsied. Moderate Sedation:      Per Anesthesia Care Recommendation:           - Full liquid diet.                           - Await pathology results.                           - Continue present medications.                           - Return patient to hospital ward for ongoing care. Procedure Code(s):        --- Professional ---                           [phone], Esophagogastroduodenoscopy, flexible,                            transoral; with dilation of gastric/duodenal                            stricture(s) (eg, balloon, bougie)                           43248, Esophagogastroduodenoscopy, flexible,                            transoral; with insertion of guide wire followed by                            passage of dilator(s) through esophagus over guide                            wire  29798, Esophagogastroduodenoscopy, flexible,                            transoral; with biopsy, single or multiple Diagnosis Code(s):        --- Professional ---                           K22.2, Esophageal obstruction                           K31.1, Adult hypertrophic pyloric stenosis                           K29.80, Duodenitis without bleeding                           R11.2, Nausea with vomiting, unspecified                           R11.10, Vomiting, unspecified CPT copyright 2017 American Medical Association. All rights reserved. The codes documented in this report are preliminary and upon coder review may  be revised to meet current compliance requirements. Barney Drain, MD Barney Drain MD, MD 12/20/2017 12:44:31 PM This report has been signed electronically. Number of Addenda: 0

## 2017-12-20 NOTE — Anesthesia Procedure Notes (Signed)
Procedure Name: MAC Date/Time: 12/20/2017 12:04 PM Performed by: Vista Deck, CRNA Pre-anesthesia Checklist: Patient identified, Emergency Drugs available, Suction available, Timeout performed and Patient being monitored Patient Re-evaluated:Patient Re-evaluated prior to induction Oxygen Delivery Method: Nasal Cannula

## 2017-12-20 NOTE — Transfer of Care (Signed)
Immediate Anesthesia Transfer of Care Note  Patient: Colleen Lee  Procedure(s) Performed: ESOPHAGOGASTRODUODENOSCOPY (EGD) WITH PROPOFOL (N/A ) BIOPSY  Patient Location: PACU  Anesthesia Type:MAC  Level of Consciousness: awake and patient cooperative  Airway & Oxygen Therapy: Patient Spontanous Breathing  Post-op Assessment: Report given to RN and Post -op Vital signs reviewed and stable  Post vital signs: Reviewed and stable  Last Vitals:  Vitals Value Taken Time  BP    Temp    Pulse 42 12/20/2017 12:37 PM  Resp    SpO2 81 % 12/20/2017 12:37 PM  Vitals shown include unvalidated device data.  Last Pain:  Vitals:   12/20/17 1148  TempSrc: Oral  PainSc:       Patients Stated Pain Goal: 0 (04/59/97 7414)  Complications: No apparent anesthesia complications

## 2017-12-20 NOTE — Anesthesia Postprocedure Evaluation (Signed)
Anesthesia Post Note  Patient: Colleen Lee  Procedure(s) Performed: ESOPHAGOGASTRODUODENOSCOPY (EGD) WITH PROPOFOL (N/A ) BIOPSY  Patient location during evaluation: Nursing Unit Anesthesia Type: MAC Level of consciousness: awake and alert, patient cooperative and oriented Pain management: satisfactory to patient Vital Signs Assessment: post-procedure vital signs reviewed and stable Respiratory status: spontaneous breathing Cardiovascular status: stable Postop Assessment: no apparent nausea or vomiting Anesthetic complications: no     Last Vitals:  Vitals:   12/20/17 1240 12/20/17 1245  BP: (!) 146/80 (!) 147/96  Pulse: 69 73  Resp: (!) 23 (!) 22  Temp: 37.3 C   SpO2: 99% 99%    Last Pain:  Vitals:   12/20/17 1240  TempSrc:   PainSc: Asleep                 Amiya Escamilla

## 2017-12-20 NOTE — Interval H&P Note (Signed)
History and Physical Interval Note:  12/20/2017 11:21 AM  Colleen Lee  has presented today for surgery, with the diagnosis of N/V  The various methods of treatment have been discussed with the patient and family. After consideration of risks, benefits and other options for treatment, the patient has consented to  Procedure(s): ESOPHAGOGASTRODUODENOSCOPY (EGD) WITH PROPOFOL (N/A) as a surgical intervention .  The patient's history has been reviewed, patient examined, no change in status, stable for surgery.  I have reviewed the patient's chart and labs.  Questions were answered to the patient's satisfaction.     [employer]

## 2017-12-20 NOTE — Consult Note (Signed)
[facility] CM Inpatient Consult   12/20/2017  Colleen Lee 06/23/1956 [id number]   Referral received by MD office for [facility] Care Management services and post hospital discharge follow up related to 4 ED visits in the last 6 months . Patient was evaluated for community based chronic disease management services with [facility] care Management Program as a benefit of patient's [facility] Medicare. Met with the patient and her brother [name] at the bedside to explain [facility] Care Management services. Patient endorses her primary care provider to be Dr. Ninetta Lights. Patient states she does not currently have issues with affording medications or obtaining transportation to MD appointments. Verbal consent obtained. Patient gave [phone]) and [phone]) as the best number to reach her. She also gave verbal  permission to call her brother [name] at [phone] if she can not be reached. Patient will receive post hospital discharge calls and be evaluated for monthly home visits. [facility] Care Management services does not interfere with or replace any services arranged by the inpatient care management team. RNCM left contact information and [facility] literature at the bedside. Made inpatient RNCM aware  [facility] will be following for care management. For additional questions please contact:   Capone Schwinn RN, BSN [facility] Liaison  [phone]) [employer] [phone]) Toll free office

## 2017-12-20 NOTE — Progress Notes (Signed)
EGD with Propofol today by Dr. Oneida Alar. Patient and family aware. NPO since midnight except meds. Mildly low potassium at 3.4 in setting of N/V. Creatinine improving, now 1.20 today. KCL 20 meQ IV ordered X 1, first dose now.

## 2017-12-20 NOTE — Plan of Care (Signed)
Patient made aware of medication orders and states that the pain medication is "helping take the pain away"

## 2017-12-20 NOTE — Progress Notes (Signed)
PROGRESS NOTE    Colleen Lee  GQQ:[id number]  DOB: [birth year]  DOA: 12/15/2017 PCP: Rosita Fire, MD   Brief Admission Hx:  Colleen Lee is a 62 y.o. female with history of chronic abdominal pain chronic nausea and vomiting presented to her PCP office earlier today with complaints of 2 weeks of nausea and vomiting.  The patient was noted to be hypotensive and was sent to the emergency department for further evaluation.  The patient was noted to be hypotensive with a systolic blood pressure reading in the 90s.  MDM/Assessment & Plan:   1. Acute renal failure-Improving. Secondary to prerenal causes as the patient was dehydrated.  restarting IV fluid hydration.    Follow electrolytes.  Recheck BMP in the morning.  Avoid nephrotoxic agents.  Renally dose medications as appropriate. 2. Hypotension-resolved now. Secondary to dehydration.  She responded to IV fluid hydration and blood pressures has rebounded.  3. Intractable nausea and vomiting- seen by gastroenterology and underwent EGD on 5/21 which indicated esophageal/pyloric stricture which were both dilated.  Continue on clear liquids for now. 4. Hypomagnesemia - repleted.    5. Chronic abdominal pain-patient is being worked up outpatient with GI.  She recently had a mesenteric vascular ultrasound: The blood vessels were reportedly open but there is some notation of the median arcuate ligament that may be pressing on 1 of the arteries but it is still open.  GI planning outpatient referral to vascular surgery.    6. GERD - protonix ordered.  7. Hyponatremia -continue on saline infusion 8. Chronic constipation - miralax BID ordered.   9. Hyperbilirubinemia - likely from chronic liver disease.  GI following 10. Hepatic cirrhosis - history of treated HCV.  11. Opioid seeking behaviors - resumed home norco for moderate pain, morphine ordered for severe pain.  12. Essential hypertension - hold lisinopril due to bump in creatinine.   Hydralazine / amlodipine / metoprolol ordered for BP.   13. Leukocytosis -possibly stress-induced.  Not resolved 14. Thrombocytopenia.  Unclear etiology.  Hold further Lovenox and monitor.  DVT Prophylaxis SCDs Code Status: Full   Family Communication:  Discussed with family member at the bedside Disposition Plan:  Discharge home when she is able to tolerate p.o.  Subjective: Patient seen in her room after endoscopy.  She is feeling a little better at this time.  No vomiting at this time.  Objective: Vitals:   12/20/17 1148 12/20/17 1240 12/20/17 1245 12/20/17 1404  BP: (!) 140/97 (!) 146/80 (!) 147/96 135/78  Pulse: 74 69 73 71  Resp: 18 (!) 23 (!) 22 16  Temp: 98.6 F (37 C) 99.2 F (37.3 C)  98.6 F (37 C)  TempSrc: Oral   Oral  SpO2: 100% 99% 99% 99%  Weight:      Height:        Intake/Output Summary (Last 24 hours) at 12/20/2017 1926 Last data filed at 12/20/2017 1453 Gross per 24 hour  Intake 440 ml  Output 400 ml  Net 40 ml   Filed Weights   12/18/17 0447 12/19/17 0446 12/20/17 0515  Weight: 72.3 kg (159 lb 6.3 oz) 73.1 kg (161 lb 2.5 oz) 73.8 kg (162 lb 11.2 oz)   REVIEW OF SYSTEMS  As per history otherwise all reviewed and reported negative  Exam:  General exam: Alert, awake, oriented x 3 Respiratory system: Clear to auscultation. Respiratory effort normal. Cardiovascular system:RRR. No murmurs, rubs, gallops. Gastrointestinal system: Abdomen is nondistended, soft and nontender. No organomegaly or masses  felt. Normal bowel sounds heard. Central nervous system: Alert and oriented. No focal neurological deficits. Extremities: No C/C/E, +pedal pulses Skin: No rashes, lesions or ulcers Psychiatry: Judgement and insight appear normal. Mood & affect appropriate.   Data Reviewed: Basic Metabolic Panel: Recent Labs  Lab 12/16/17 0458 12/17/17 0736 12/18/17 0734 12/19/17 0600 12/20/17 0547  NA 133* 137 131* 131* 130*  K 4.0 3.8 3.5 3.6 3.4*  CL 106 109  96* 100* 100*  CO2 20* 19* 20* 20* 20*  GLUCOSE 92 110* 121* 119* 100*  BUN 31* 12 16 20 20   CREATININE 1.59* 1.04* 1.68* 1.39* 1.20*  CALCIUM 9.8 9.9 11.2* 10.2 9.6  MG 2.0 1.3* 2.4 1.9  --    Liver Function Tests: Recent Labs  Lab 12/16/17 0458 12/17/17 0736 12/18/17 0734 12/19/17 0600 12/20/17 0547  AST 13* 17 20 22 19   ALT 15 15 17 21 21   ALKPHOS 49 49 67 56 49  BILITOT 1.6* 1.7* 3.0* 2.8* 3.0*  PROT 7.0 7.6 9.4* 8.0 7.0  ALBUMIN 3.8 4.0 4.9 4.2 3.7   Recent Labs  Lab 12/15/17 1307  LIPASE 29   No results for input(s): AMMONIA in the last 168 hours. CBC: Recent Labs  Lab 12/16/17 0458 12/17/17 0736 12/18/17 0734 12/19/17 0600 12/20/17 0547  WBC 5.7 5.8 14.2* 12.1* 8.6  NEUTROABS 3.7 4.7 12.3* 10.2* 6.3  HGB 12.9 13.4 15.7* 15.2* 13.3  HCT 37.5 39.0 44.1 42.5 38.3  MCV 89.9 90.5 87.8 88.2 89.3  PLT 214 198 176 124* 85*   Cardiac Enzymes: No results for input(s): CKTOTAL, CKMB, CKMBINDEX, TROPONINI in the last 168 hours. CBG (last 3)  No results for input(s): GLUCAP in the last 72 hours. No results found for this or any previous visit (from the past 240 hour(s)).   Studies: No results found. Scheduled Meds: . amLODipine  10 mg Oral Daily  . DULoxetine  30 mg Oral Daily  . feeding supplement  1 Container Oral TID BM  . feeding supplement (ENSURE ENLIVE)  237 mL Oral TID BM  . hydrALAZINE  10 mg Oral Q8H  . metoprolol tartrate  25 mg Oral BID  . mometasone-formoterol  2 puff Inhalation BID  . nicotine  14 mg Transdermal Daily  . ondansetron (ZOFRAN) IV  4 mg Intravenous TID WC & HS  . pantoprazole  40 mg Oral BID AC   Continuous Infusions: . lactated ringers with kcl 75 mL/hr at 12/20/17 1619    Principal Problem:   Acute renal failure (ARF) (HCC) Active Problems:   Abdominal pain, epigastric   History of Helicobacter pylori infection   Hepatic cirrhosis (HCC)   Alcoholic cirrhosis of liver without ascites (HCC)   Intractable nausea and  vomiting   Essential hypertension   GERD (gastroesophageal reflux disease)   Nausea with vomiting   Chronic abdominal pain   Malnutrition of moderate degree   Leukocytosis   Thrombocytopenia ([place])  Time spent: 12mins  Kathie Dike, MD [facility] Pager [phone]  If 7PM-7AM, please contact night-coverage [email or web address] Password [email or web address] 12/20/2017, 7:26 PM    LOS: 5 days

## 2017-12-20 NOTE — Anesthesia Preprocedure Evaluation (Signed)
Anesthesia Evaluation  Patient identified by MRN, date of birth, ID band Patient awake    Reviewed: Allergy & Precautions, H&P , NPO status , [name] Chart, lab work & pertinent test results, reviewed documented beta blocker date and time   History of Anesthesia Complications (+) PONV and history of anesthetic complications  Airway Mallampati: II  TM Distance: >3 FB Neck ROM: full    Dental no notable dental hx. (+) Edentulous Lower, Edentulous Upper   Pulmonary neg pulmonary ROS, Current Smoker,    Pulmonary exam normal breath sounds clear to auscultation       Cardiovascular Exercise Tolerance: Good hypertension, On Medications negative cardio ROS   Rhythm:regular Rate:Normal     Neuro/Psych  Neuromuscular disease negative neurological ROS  negative psych ROS   GI/Hepatic negative GI ROS, Neg liver ROS, GERD  ,(+) Hepatitis -, Unspecified  Endo/Other  negative endocrine ROS  Renal/GU Renal diseasenegative Renal ROS  negative genitourinary   Musculoskeletal   Abdominal   Peds  Hematology negative hematology ROS (+)   Anesthesia Other Findings Chart reflects "h/o diff anesthestic- pt interviewed and denies any anesthesia issues  Reproductive/Obstetrics negative OB ROS                             Anesthesia Physical Anesthesia Plan  ASA: III  Anesthesia Plan: MAC   Post-op Pain Management:    Induction:   PONV Risk Score and Plan:   Airway Management Planned:   Additional Equipment:   Intra-op Plan:   Post-operative Plan:   Informed Consent: I have reviewed the patients History and Physical, chart, labs and discussed the procedure including the risks, benefits and alternatives for the proposed anesthesia with the patient or authorized representative who has indicated his/her understanding and acceptance.   Dental Advisory Given  Plan Discussed with: CRNA and  Anesthesiologist  Anesthesia Plan Comments:         Anesthesia Quick Evaluation

## 2017-12-21 ENCOUNTER — Encounter: Payer: Self-pay | Admitting: *Deleted

## 2017-12-21 ENCOUNTER — Other Ambulatory Visit: Payer: Self-pay | Admitting: *Deleted

## 2017-12-21 DIAGNOSIS — K311 Adult hypertrophic pyloric stenosis: Secondary | ICD-10-CM

## 2017-12-21 LAB — COMPREHENSIVE METABOLIC PANEL
ALT: 23 U/L (ref 14–54)
AST: 21 U/L (ref 15–41)
Albumin: 3.4 g/dL — ABNORMAL LOW (ref 3.5–5.0)
Alkaline Phosphatase: 42 U/L (ref 38–126)
Anion gap: 7 (ref 5–15)
BUN: 14 mg/dL (ref 6–20)
CO2: 22 mmol/L (ref 22–32)
Calcium: 9.6 mg/dL (ref 8.9–10.3)
Chloride: 103 mmol/L (ref 101–111)
Creatinine, Ser: 0.97 mg/dL (ref 0.44–1.00)
GFR calc Af Amer: >60 mL/min (ref >60)
GFR calc non Af Amer: >60 mL/min (ref >60)
Glucose, Bld: 94 mg/dL (ref 65–99)
Potassium: 3.4 mmol/L — ABNORMAL LOW (ref 3.5–5.1)
Sodium: 132 mmol/L — ABNORMAL LOW (ref 135–145)
Total Bilirubin: 2.5 mg/dL — ABNORMAL HIGH (ref 0.3–1.2)
Total Protein: 6.3 g/dL — ABNORMAL LOW (ref 6.5–8.1)

## 2017-12-21 LAB — CBC
HCT: 33.6 % — ABNORMAL LOW (ref 36.0–46.0)
Hemoglobin: 11.6 g/dL — ABNORMAL LOW (ref 12.0–15.0)
MCH: 30.7 pg (ref 26.0–34.0)
MCHC: 34.5 g/dL (ref 30.0–36.0)
MCV: 88.9 fL (ref 78.0–100.0)
Platelets: 75 10*3/uL — ABNORMAL LOW (ref 150–400)
RBC: 3.78 MIL/uL — ABNORMAL LOW (ref 3.87–5.11)
RDW: 13.3 % (ref 11.5–15.5)
WBC: 7.4 10*3/uL (ref 4.0–10.5)

## 2017-12-21 LAB — LACTATE DEHYDROGENASE: LDH: 235 U/L — ABNORMAL HIGH (ref 98–192)

## 2017-12-21 MED ORDER — DIPHENHYDRAMINE HCL 25 MG PO CAPS
50.0000 mg | ORAL_CAPSULE | Freq: Every evening | ORAL | Status: DC | PRN
  Administered 2017-12-21: 50 mg via ORAL
  Filled 2017-12-21: qty 2

## 2017-12-21 MED ORDER — POTASSIUM CHLORIDE CRYS ER 20 MEQ PO TBCR
40.0000 meq | EXTENDED_RELEASE_TABLET | Freq: Once | ORAL | Status: AC
  Administered 2017-12-21: 40 meq via ORAL
  Filled 2017-12-21: qty 2

## 2017-12-21 NOTE — Care Management Important Message (Signed)
Important Message  Patient Details  Name: Colleen Lee MRN: [id number] Date of Birth: February 01, 1956   Medicare Important Message Given:  Yes    [name] 12/21/2017, 2:59 PM

## 2017-12-21 NOTE — Progress Notes (Signed)
Subjective:  Feels much better. Able to tolerate full liquids last night and this morning. Is requesting to advance diet. +flatus.   Objective: Vital signs in last 24 hours: Temp:  [98.6 F (37 C)-99.2 F (37.3 C)] 98.6 F (37 C) (05/22 0646) Pulse Rate:  [66-93] 66 (05/22 0646) Resp:  [16-23] 16 (05/22 0646) BP: (129-156)/(78-97) 129/80 (05/22 0646) SpO2:  [98 %-100 %] 100 % (05/22 0646) Last BM Date: 12/19/17 General:   Alert,  Well-developed, well-nourished, pleasant and cooperative in NAD Head:  Normocephalic and atraumatic. Eyes:  Sclera clear, no icterus.  Abdomen:  Soft, nontender and nondistended. Normal bowel sounds, without guarding, and without rebound.  Neurologic:  Alert and  oriented x4;  grossly normal neurologically. Skin:  Intact without significant lesions or rashes. Psych:  Alert and cooperative. Normal mood and affect.  Intake/Output from previous day: 05/21 0701 - 05/22 0700 In: 1453.3 [P.O.:240; I.V.:1213.3] Out: 0  Intake/Output this shift: No intake/output data recorded.  Lab Results: CBC Recent Labs    12/19/17 0600 12/20/17 0547 12/21/17 0546  WBC 12.1* 8.6 7.4  HGB 15.2* 13.3 11.6*  HCT 42.5 38.3 33.6*  MCV 88.2 89.3 88.9  PLT 124* 85* 75*   BMET Recent Labs    12/19/17 0600 12/20/17 0547 12/21/17 0546  NA 131* 130* 132*  K 3.6 3.4* 3.4*  CL 100* 100* 103  CO2 20* 20* 22  GLUCOSE 119* 100* 94  BUN 20 20 14   CREATININE 1.39* 1.20* 0.97  CALCIUM 10.2 9.6 9.6   LFTs Recent Labs    12/19/17 0600 12/20/17 0547 12/21/17 0546  BILITOT 2.8* 3.0* 2.5*  ALKPHOS 56 49 42  AST 22 19 21   ALT 21 21 23   PROT 8.0 7.0 6.3*  ALBUMIN 4.2 3.7 3.4*   No results for input(s): LIPASE in the last 72 hours. PT/INR No results for input(s): LABPROT, INR in the last 72 hours.    Imaging Studies: Dg Chest 2 View  Result Date: 12/18/2017 CLINICAL DATA:  Nausea and generalized abdominal pain EXAM: CHEST - 2 VIEW COMPARISON:  Chest radiograph  12/15/2017 FINDINGS: Monitoring leads overlie the patient. Stable cardiac and mediastinal contours. No consolidative pulmonary opacities. No pleural effusion or pneumothorax. Thoracic spine degenerative changes. IMPRESSION: No acute cardiopulmonary process. Electronically Signed   By: Lovey Newcomer M.D.   On: 12/18/2017 13:39   US Renal  Result Date: 12/16/2017 CLINICAL DATA:  Acute renal failure. EXAM: RENAL / URINARY TRACT ULTRASOUND COMPLETE COMPARISON:  CT, 10/07/2016 FINDINGS: Right Kidney: Length: 11.1 cm. Slight prominence of the right renal pelvis, but no convincing hydronephrosis. No renal masses or stones. Left Kidney: Length: 10.9 cm. Echogenicity within normal limits. No mass or hydronephrosis visualized. Bladder: Appears normal for degree of bladder distention. IMPRESSION: Normal renal ultrasound. Electronically Signed   By: Lajean Manes M.D.   On: 12/16/2017 12:05   Dg Abd Acute W/chest  Result Date: 12/15/2017 CLINICAL DATA:  Abdominal pain.  Nausea and vomiting for 2 weeks. EXAM: DG ABDOMEN ACUTE W/ 1V CHEST COMPARISON:  11/13/2017 FINDINGS: Heart size is normal. The lungs are free of focal consolidations and pleural effusions. There is no pulmonary edema. Mildly prominent interstitial markings appear stable. There is no free air beneath the diaphragm. Bowel gas pattern is nonobstructed. Calcifications in the pelvis are consistent with uterine fibroids. There are degenerative changes in the hips. IMPRESSION: Negative abdominal radiographs. No acute cardiopulmonary disease. Calcified uterine fibroids. Electronically Signed   By: Nolon Nations M.D.  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   GES could not be done due to vomiting.   EGD yesterday showed benign-appearing esophageal stenosis, dilated.  Mild pyloric stenosis dilated and mild inflammation of the duodenal bulb biopsied.  Mild duodenitis biopsied.  Clinically feeling better today. Tolerating diet.   Plan: 1. Follow-up pending path. 2. Continue PPI.  3. Advance diet.  4. If tolerates, then we can back down on zofran to prn use.  5. She is scheduled for colonoscopy and EGD in 12/2017 (planned prior to this hospitalization).   Laureen Ochs. Bernarda Caffey [facility] [phone] 5/22/20198:55 AM     LOS: 6 days

## 2017-12-21 NOTE — Progress Notes (Signed)
PROGRESS NOTE    Colleen Lee  DGL:[id number]  DOB: 1956/06/09  DOA: 12/15/2017 PCP: Rosita Fire, MD   Brief Admission Hx:  Colleen Lee is a 62 y.o. female with history of chronic abdominal pain chronic nausea and vomiting presented to her PCP office earlier today with complaints of 2 weeks of nausea and vomiting.  The patient was noted to be hypotensive and was sent to the emergency department for further evaluation.  The patient was noted to be hypotensive with a systolic blood pressure reading in the 90s.  MDM/Assessment & Plan:   1. Acute renal failure-Improving. Secondary to prerenal causes as the patient was dehydrated.    Treated with IV fluids and has improved. 2. Hypotension-resolved now. Secondary to dehydration.  She responded to IV fluid hydration and blood pressures has rebounded.  3. Intractable nausea and vomiting- seen by gastroenterology and underwent EGD on 5/21 which indicated esophageal/pyloric stricture which were both dilated.  Clinically she appears to be improving.  Tolerating solid diet. 4. Hypomagnesemia - repleted.    5. Chronic abdominal pain-patient is being worked up outpatient with GI.  She recently had a mesenteric vascular ultrasound: The blood vessels were reportedly open but there is some notation of the median arcuate ligament that may be pressing on 1 of the arteries but it is still open.  GI planning outpatient referral to vascular surgery.    6. GERD - protonix ordered.  7. Hyponatremia -improved with saline infusion  8. Chronic constipation - miralax BID ordered.   9. Hyperbilirubinemia - likely from chronic liver disease.  GI following 10. Hepatic cirrhosis - history of treated HCV.  11. Opioid seeking behaviors - resumed home norco for moderate pain, morphine ordered for severe pain.  12. Essential hypertension - hold lisinopril due to bump in creatinine.  Hydralazine / amlodipine / metoprolol ordered for BP.   13. Leukocytosis -possibly  stress-induced.  Now resolved 14. Thrombocytopenia.  Unclear etiology.  Holding Lovenox.  No obvious medications.  Check B12 and LDH.  She does not appear septic or toxic.  DVT Prophylaxis SCDs Code Status: Full   Family Communication:  Discussed with family member at the bedside Disposition Plan:  Discharge home when she is able to tolerate p.o. and platelets have stabilized  Subjective: Feeling better today.  Tolerating solid diet.  No vomiting.  Objective: Vitals:   12/20/17 2001 12/20/17 2136 12/21/17 0646 12/21/17 1416  BP:  (!) 156/97 129/80 114/81  Pulse:  93 66 72  Resp:  18 16 18   Temp:  98.7 F (37.1 C) 98.6 F (37 C) 98.8 F (37.1 C)  TempSrc:  Oral Oral Oral  SpO2: 98% 100% 100% 98%  Weight:      Height:        Intake/Output Summary (Last 24 hours) at 12/21/2017 1710 Last data filed at 12/21/2017 1500 Gross per 24 hour  Intake 1901.67 ml  Output -  Net 1901.67 ml   Filed Weights   12/18/17 0447 12/19/17 0446 12/20/17 0515  Weight: 72.3 kg (159 lb 6.3 oz) 73.1 kg (161 lb 2.5 oz) 73.8 kg (162 lb 11.2 oz)   REVIEW OF SYSTEMS  As per history otherwise all reviewed and reported negative  Exam:  General exam: Alert, awake, oriented x 3 Respiratory system: Clear to auscultation. Respiratory effort normal. Cardiovascular system:RRR. No murmurs, rubs, gallops. Gastrointestinal system: Abdomen is nondistended, soft and nontender. No organomegaly or masses felt. Normal bowel sounds heard. Central nervous system: Alert and oriented.  No focal neurological deficits. Extremities: No C/C/E, +pedal pulses Skin: No rashes, lesions or ulcers Psychiatry: Judgement and insight appear normal. Mood & affect appropriate.    Data Reviewed: Basic Metabolic Panel: Recent Labs  Lab 12/16/17 0458 12/17/17 0736 12/18/17 0734 12/19/17 0600 12/20/17 0547 12/21/17 0546  NA 133* 137 131* 131* 130* 132*  K 4.0 3.8 3.5 3.6 3.4* 3.4*  CL 106 109 96* 100* 100* 103  CO2 20*  19* 20* 20* 20* 22  GLUCOSE 92 110* 121* 119* 100* 94  BUN 31* 12 16 20 20 14   CREATININE 1.59* 1.04* 1.68* 1.39* 1.20* 0.97  CALCIUM 9.8 9.9 11.2* 10.2 9.6 9.6  MG 2.0 1.3* 2.4 1.9  --   --    Liver Function Tests: Recent Labs  Lab 12/17/17 0736 12/18/17 0734 12/19/17 0600 12/20/17 0547 12/21/17 0546  AST 17 20 22 19 21   ALT 15 17 21 21 23   ALKPHOS 49 67 56 49 42  BILITOT 1.7* 3.0* 2.8* 3.0* 2.5*  PROT 7.6 9.4* 8.0 7.0 6.3*  ALBUMIN 4.0 4.9 4.2 3.7 3.4*   Recent Labs  Lab 12/15/17 1307  LIPASE 29   No results for input(s): AMMONIA in the last 168 hours. CBC: Recent Labs  Lab 12/16/17 0458 12/17/17 0736 12/18/17 0734 12/19/17 0600 12/20/17 0547 12/21/17 0546  WBC 5.7 5.8 14.2* 12.1* 8.6 7.4  NEUTROABS 3.7 4.7 12.3* 10.2* 6.3  --   HGB 12.9 13.4 15.7* 15.2* 13.3 11.6*  HCT 37.5 39.0 44.1 42.5 38.3 33.6*  MCV 89.9 90.5 87.8 88.2 89.3 88.9  PLT 214 198 176 124* 85* 75*   Cardiac Enzymes: No results for input(s): CKTOTAL, CKMB, CKMBINDEX, TROPONINI in the last 168 hours. CBG (last 3)  No results for input(s): GLUCAP in the last 72 hours. No results found for this or any previous visit (from the past 240 hour(s)).   Studies: No results found. Scheduled Meds: . amLODipine  10 mg Oral Daily  . DULoxetine  30 mg Oral Daily  . feeding supplement  1 Container Oral TID BM  . feeding supplement (ENSURE ENLIVE)  237 mL Oral TID BM  . hydrALAZINE  10 mg Oral Q8H  . metoprolol tartrate  25 mg Oral BID  . mometasone-formoterol  2 puff Inhalation BID  . nicotine  14 mg Transdermal Daily  . ondansetron (ZOFRAN) IV  4 mg Intravenous TID WC & HS  . pantoprazole  40 mg Oral BID AC  . potassium chloride  40 mEq Oral Once   Continuous Infusions:   Principal Problem:   Acute renal failure (ARF) (HCC) Active Problems:   Abdominal pain, epigastric   History of Helicobacter pylori infection   Hepatic cirrhosis (HCC)   Alcoholic cirrhosis of liver without ascites (HCC)    Intractable nausea and vomiting   Essential hypertension   GERD (gastroesophageal reflux disease)   Nausea with vomiting   Chronic abdominal pain   Malnutrition of moderate degree   Leukocytosis   Thrombocytopenia (HCC)   Pyloric stenosis in adult  Time spent: 50mins  Kathie Dike, MD [facility] Pager [phone]  If 7PM-7AM, please contact night-coverage [email or web address] Password [email or web address] 12/21/2017, 5:10 PM    LOS: 6 days

## 2017-12-21 NOTE — Patient Outreach (Signed)
[name]  12/21/2017  [name] 1956-06-27 [id number]  [name] is a 62 year old female with medical history which includes cirrhosis of the liver secondary to ETOH, GERD, essential hypertension, orthopedic injuries, chronic viral [name] s/p treatment and achieving SVR. [name] is currently admitted to [facility] after presenting with nausea/vomiting, acute on chronic abdominal pain, and diarrhea. She underwent EGD on 12/20/17 which showed benign-appearing dilated esophageal stenosis, mild dilated pyloric stenosis, and inflammation of the duodenal bulb, and mild duodenitis. [name] has been referred to [facility] for transition of care services at her discharge.   Plan:I will follow [name]'s progress and reach out to her upon hospital discharge.    [facility]  [phone]

## 2017-12-22 ENCOUNTER — Encounter (HOSPITAL_COMMUNITY): Payer: Self-pay | Admitting: Gastroenterology

## 2017-12-22 DIAGNOSIS — D72829 Elevated white blood cell count, unspecified: Secondary | ICD-10-CM

## 2017-12-22 LAB — CBC
HCT: 32 % — ABNORMAL LOW (ref 36.0–46.0)
Hemoglobin: 11.1 g/dL — ABNORMAL LOW (ref 12.0–15.0)
MCH: 30.7 pg (ref 26.0–34.0)
MCHC: 34.7 g/dL (ref 30.0–36.0)
MCV: 88.6 fL (ref 78.0–100.0)
Platelets: 85 10*3/uL — ABNORMAL LOW (ref 150–400)
RBC: 3.61 MIL/uL — ABNORMAL LOW (ref 3.87–5.11)
RDW: 13.2 % (ref 11.5–15.5)
WBC: 7.1 10*3/uL (ref 4.0–10.5)

## 2017-12-22 LAB — VITAMIN B12: Vitamin B-12: 5218 pg/mL — ABNORMAL HIGH (ref 180–914)

## 2017-12-22 MED ORDER — AMLODIPINE BESYLATE 10 MG PO TABS: 10.0000 mg | ORAL_TABLET | Freq: Every day | ORAL | 0 refills | Status: DC

## 2017-12-22 MED ORDER — PANTOPRAZOLE SODIUM 40 MG PO TBEC: 40.0000 mg | DELAYED_RELEASE_TABLET | Freq: Two times a day (BID) | ORAL | 0 refills | Status: DC

## 2017-12-22 MED ORDER — METOPROLOL TARTRATE 25 MG PO TABS: 25.0000 mg | ORAL_TABLET | Freq: Two times a day (BID) | ORAL | 0 refills | Status: DC

## 2017-12-22 MED ORDER — ONDANSETRON 4 MG PO TBDP: 4.0000 mg | ORAL_TABLET | Freq: Three times a day (TID) | ORAL | 0 refills | Status: DC

## 2017-12-22 MED ORDER — ONDANSETRON HCL 4 MG PO TABS
4.0000 mg | ORAL_TABLET | Freq: Three times a day (TID) | ORAL | Status: DC
  Administered 2017-12-22: 4 mg via ORAL
  Filled 2017-12-22: qty 1

## 2017-12-22 NOTE — Progress Notes (Addendum)
Subjective:  Tolerated solid food. If felt full, stopped eating for awhile. No vomiting. +flatus. Abdominal pain improved.   Objective: Vital signs in last 24 hours: Temp:  [98.8 F (37.1 C)-99.1 F (37.3 C)] 98.8 F (37.1 C) (05/23 0631) Pulse Rate:  [72-75] 74 (05/23 0631) Resp:  [16-18] 18 (05/23 0631) BP: (114-135)/(81-84) 124/81 (05/23 0631) SpO2:  [98 %-100 %] 99 % (05/23 0733) Weight:  [172 lb 9.9 oz (78.3 kg)] 172 lb 9.9 oz (78.3 kg) (05/23 0631) Last BM Date: 12/19/17 General:   Alert,  Well-developed, well-nourished, pleasant and cooperative in NAD Head:  Normocephalic and atraumatic. Eyes:  Sclera clear, no icterus.  Abdomen:  Soft, nontender and nondistended.  Normal bowel sounds, without guarding, and without rebound.   Extremities:  Without clubbing, deformity or edema. Neurologic:  Alert and  oriented x4;  grossly normal neurologically. Skin:  Intact without significant lesions or rashes. Psych:  Alert and cooperative. Normal mood and affect.  Intake/Output from previous day: 05/22 0701 - 05/23 0700 In: 888.3 [I.V.:888.3] Out: 200 [Urine:200] Intake/Output this shift: No intake/output data recorded.  Lab Results: CBC Recent Labs    12/20/17 0547 12/21/17 0546 12/22/17 0502  WBC 8.6 7.4 7.1  HGB 13.3 11.6* 11.1*  HCT 38.3 33.6* 32.0*  MCV 89.3 88.9 88.6  PLT 85* 75* 85*   BMET Recent Labs    12/20/17 0547 12/21/17 0546  NA 130* 132*  K 3.4* 3.4*  CL 100* 103  CO2 20* 22  GLUCOSE 100* 94  BUN 20 14  CREATININE 1.20* 0.97  CALCIUM 9.6 9.6   LFTs Recent Labs    12/20/17 0547 12/21/17 0546  BILITOT 3.0* 2.5*  ALKPHOS 49 42  AST 19 21  ALT 21 23  PROT 7.0 6.3*  ALBUMIN 3.7 3.4*   No results for input(s): LIPASE in the last 72 hours. PT/INR No results for input(s): LABPROT, INR in the last 72 hours.    Imaging Studies: Dg Chest 2 View  Result Date: 12/18/2017 CLINICAL DATA:  Nausea and generalized abdominal pain EXAM: CHEST - 2 VIEW  COMPARISON:  Chest radiograph 12/15/2017 FINDINGS: Monitoring leads overlie the patient. Stable cardiac and mediastinal contours. No consolidative pulmonary opacities. No pleural effusion or pneumothorax. Thoracic spine degenerative changes. IMPRESSION: No acute cardiopulmonary process. Electronically Signed   By: Lovey Newcomer M.D.   On: 12/18/2017 13:39   US Renal  Result Date: 12/16/2017 CLINICAL DATA:  Acute renal failure. EXAM: RENAL / URINARY TRACT ULTRASOUND COMPLETE COMPARISON:  CT, 10/07/2016 FINDINGS: Right Kidney: Length: 11.1 cm. Slight prominence of the right renal pelvis, but no convincing hydronephrosis. No renal masses or stones. Left Kidney: Length: 10.9 cm. Echogenicity within normal limits. No mass or hydronephrosis visualized. Bladder: Appears normal for degree of bladder distention. IMPRESSION: Normal renal ultrasound. Electronically Signed   By: Lajean Manes M.D.   On: 12/16/2017 12:05   Dg Abd Acute W/chest  Result Date: 12/15/2017 CLINICAL DATA:  Abdominal pain.  Nausea and vomiting for 2 weeks. EXAM: DG ABDOMEN ACUTE W/ 1V CHEST COMPARISON:  11/13/2017 FINDINGS: Heart size is normal. The lungs are free of focal consolidations and pleural effusions. There is no pulmonary edema. Mildly prominent interstitial markings appear stable. There is no free air beneath the diaphragm. Bowel gas pattern is nonobstructed. Calcifications in the pelvis are consistent with uterine fibroids. There are degenerative changes in the hips. IMPRESSION: Negative abdominal radiographs. No acute cardiopulmonary disease. Calcified uterine fibroids. Electronically Signed   By: Nolon Nations  M.D.   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 GES could not be done due to vomiting.   EGD this admission showed benign-appearing esophageal stenosis, dilated.  Mild pyloric stenosis dilated and mild inflammation of the duodenal bulb. Gastric biopsies with chronic gastritis. H.pylori stains pending. Duodenal biopsy with chronic duodenitis.    Plan: 1. Switch IV zofran to oral zofran scheduled. Continue scheduled oral Zofran at home at time of discharge.   2. Continue PPI BID. 3. F/u final path. 4. Colonoscopy as outpatient on 01/19/18.  Laureen Ochs. Bernarda Caffey [facility] [phone] 5/23/20198:27 AM     LOS: 7 days

## 2017-12-22 NOTE — Care Management Note (Signed)
Case Management Note  Patient Details  Name: CAELYNN MARSHMAN MRN: [id number] Date of Birth: [birth year]   If discussed at [place] Length of stay: 12/22/2017  Additional Comments:  [place], Chauncey Reading, RN 12/22/2017, 12:04 PM

## 2017-12-22 NOTE — Progress Notes (Addendum)
Nutrition Follow-up  DOCUMENTATION CODES:  Non-severe (moderate) malnutrition in context of chronic illness  INTERVENTION:  Continue goal of meeting greater than or equal to 90% of pt's needs.  Continue to monitor intake and follow up as warranted.   D/C breeze  Continue Ensure Enlive po BID, each supplement provides 350 kcal and 20 grams of protein  NUTRITION DIAGNOSIS:  Unintentional weight loss related to poor appetite, decreased appetite, acute illness, nausea, vomiting as evidenced by percent weight loss, mild muscle depletion, energy intake < 75% for > 7 days.  GOAL:  Patient will meet greater than or equal to 90% of their needs  Goal MET  MONITOR:   PO intake, I & O's, Weight trends, Diet advancement, Supplement acceptance, Labs  ASSESSMENT:  62 y.o. Female, Hx: Chr abd pain, Chr constipation, cirrhosis, GERD, [name], HTN, N&V.  Tx for ARF, DH, and N&V.  Malnutrition risk due to rapid weight loss and Hx of poor appetite.  In follow up, pt said she's been on a solid food diet since yesterday, 5/22, and that it has been going well.  No nausea, vomiting, constipation, or diarrhea.  Pt says she's been drinking two of the three Boost Breeze supplements she's been prescribed daily along with eating all her meals.  Pt seems to be progressing well, no further interventions required at this time.  Diet Order:   Diet Order           DIET SOFT Room service appropriate? Yes; Fluid consistency: Thin  Diet effective now         EDUCATION NEEDS:  Not appropriate for education at this time  Skin:  Skin Assessment: Reviewed RN Assessment  Last BM:  5/20  Height:   Ht Readings from Last 1 Encounters:  12/15/17 5' 6" (1.676 m)    Weight:   Wt Readings from Last 1 Encounters:  12/22/17 172 lb 9.9 oz (78.3 kg)    Ideal Body Weight:  59 kg  BMI:  Body mass index is 27.86 kg/m.  Estimated Nutritional Needs:   Kcal:  1760-1900kcal (MSJ*1.3-1.4 AF)  Protein:   78-101g (1-1.3g/kg ABW)  Fluid:  1760-1900mL (1mL/kcal)  

## 2017-12-22 NOTE — Discharge Summary (Signed)
Physician Discharge Summary  Colleen Lee EPP:[id number] DOB: 09/09/1955 DOA: 12/15/2017  PCP: Rosita Fire, MD  Admit date: 12/15/2017 Discharge date: 12/22/2017  Admitted From: Home Disposition:  Home  Recommendations for Outpatient Follow-up:  1. Follow up with PCP in 1-2 weeks 2. Please obtain BMP/CBC in one week 3. Follow-up with gastroenterology in 2 weeks for gastric emptying study 4. Repeat thyroid function study in 3 to 4 weeks.  Home Health: Equipment/Devices:  Discharge Condition: Stable CODE STATUS: Full code Diet recommendation: low residue diet   Brief/Interim Summary: 62 y.o.femalewithhistory of chronic abdominal pain chronic nausea and vomiting presented to her PCP office earlier today with complaints of 2 weeks of nausea and vomiting. The patient was noted to be hypotensive and was sent to the emergency department for further evaluation. The patient was noted to be hypotensive with a systolic blood pressure reading in the 90s.  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 Acute renal failure. Secondary to prerenal causes as the patient was dehydrated.   Treated with IV fluids and has improved. 2. Hypotension-resolved now. Secondary to dehydration. She responded to IV fluid hydration and blood pressures has rebounded.  3. Intractable nausea and vomiting- seen by gastroenterology and underwent EGD on 5/21 which indicated esophageal/pyloric stricture which were both dilated.  Clinically she appears to be  improving.  Tolerating solid diet.  She is maintained on Zofran 3 times daily.  Plans are to follow-up with GI as an outpatient for gastric emptying study in 2 weeks 4. Hypomagnesemia - repleted.    5. Chronic abdominal pain-patient is being worked up outpatient with GI. She recently had a mesenteric vascular ultrasound: The blood vessels were reportedly open but there is some notation of the median arcuate ligament that may be pressing on 1 of the arteries but it is still open.  GI planning outpatient referral to vascular surgery.   6. GERD - protonix ordered.  7. Hyponatremia -improved with saline infusion  8. Chronic constipation - miralax BID ordered.  9. Hyperbilirubinemia - likely from chronic liver disease/hep C.  GI following 10. Hepatic cirrhosis - history of treated HCV.  11. Opioid seeking behaviors - resumed home norco for moderate pain, morphine ordered for severe pain.  12. Essential hypertension - hold lisinopril due to bump in creatinine.  Hydralazine / amlodipine / metoprolol ordered for BP. Blood pressures have been stable 13. Leukocytosis -possibly stress-induced.  Now resolved 14. Thrombocytopenia.  Unclear etiology.  Holding Lovenox.  No obvious medications.    B12 and LDH were noncontributory.  Since holding Lovenox, platelets have begin to rebound.  She will need repeat CBC in 1 week. 15. Abnormal thyroid studies.  Found to have low TSH at 0.143.  Free T4 noted to be normal range.  Repeat thyroid function study in the next 3 to 4 weeks.  If continues to be abnormal, consider outpatient endocrinology referral.     Discharge Instructions  Discharge Instructions    AMB Referral to [facility]   Complete by:  As directed  Please assign patient for community nurse to engage for transition of care calls and evaluate for monthly home visits. For questions please contact:   Janci Minor RN, [facility]iaison [phone])   Reason for consult:  Post  hospital discharge follow up with [facility]   Diagnoses of:  Other   Other Diagnosis:  cirrhosis, asthma, 4 ED visits in the last 6 months   Expected date of contact:  1-3 days (reserved for hospital discharges)   Diet - low sodium heart healthy   Complete by:  As directed    Increase activity slowly   Complete by:  As directed      Allergies as of 12/22/2017      Reactions   Penicillins Rash   Has patient had a PCN reaction causing immediate rash, facial/tongue/throat swelling, SOB or lightheadedness with hypotension: Yes Has patient had a PCN reaction causing severe rash involving mucus membranes or skin necrosis: No Has patient had a PCN reaction that required hospitalization No Has patient had a PCN reaction occurring within the last 10 years: No If all of the above answers are "NO", then may proceed with Cephalosporin use.      Medication List    STOP taking these medications   aspirin 325 MG EC tablet   lisinopril 20 MG tablet Commonly known as:  PRINIVIL,ZESTRIL     TAKE these medications   amLODipine 10 MG tablet Commonly known as:  NORVASC Take 1 tablet (10 mg total) by mouth daily. Start taking on:  12/23/2017   budesonide-formoterol 160-4.5 MCG/ACT inhaler Commonly known as:  SYMBICORT Inhale 2 puffs into the lungs 2 (two) times daily.   DULoxetine 30 MG capsule Commonly known as:  CYMBALTA Take 1 capsule by mouth 2 (two) times daily.   ergocalciferol 50000 units capsule Commonly known as:  VITAMIN D2 Take 50,000 Units by mouth every 30 (thirty) days.   GAVILYTE-G 236 g solution Generic drug:  polyethylene glycol as needed.   HYDROcodone-acetaminophen 5-325 MG tablet Commonly known as:  NORCO/VICODIN Take 1 tablet by mouth as needed for moderate pain.   hydrOXYzine 10 MG tablet Commonly known as:  ATARAX/VISTARIL   megestrol 40 MG tablet Commonly known as:  MEGACE Take 40 mg by mouth daily.   metoprolol tartrate 25 MG  tablet Commonly known as:  LOPRESSOR Take 1 tablet (25 mg total) by mouth 2 (two) times daily.   ondansetron 4 MG disintegrating tablet Commonly known as:  ZOFRAN ODT Take 1 tablet (4 mg total) by mouth 3 (three) times daily. 4mg  ODT q4 hours prn nausea/vomit What changed:    how much to take  how to take this  when to take this   pantoprazole 40 MG tablet Commonly known as:  PROTONIX Take 1 tablet (40 mg total) by mouth 2 (two) times daily before a meal.   Plecanatide 3 MG Tabs Commonly known as:  TRULANCE Take 1 tablet by mouth daily.   traMADol 50 MG tablet Commonly known as:  ULTRAM Take 1 tablet (50 mg total) by mouth every 6 (six) hours as needed.   VITAMIN B 12 PO Take 1 tablet by mouth daily.       Allergies  Allergen Reactions  . Penicillins Rash    Has patient had a PCN reaction causing immediate rash, facial/tongue/throat swelling, SOB or lightheadedness with hypotension: Yes Has patient had a PCN reaction causing severe rash involving mucus membranes or skin necrosis: No Has patient had  a PCN reaction that required hospitalization No Has patient had a PCN reaction occurring within the last 10 years: No If all of the above answers are "NO", then may proceed with Cephalosporin use.     Consultations:  Gastroenterology   Procedures/Studies: Dg Chest 2 View  Result Date: 12/18/2017 CLINICAL DATA:  Nausea and generalized abdominal pain EXAM: CHEST - 2 VIEW COMPARISON:  Chest radiograph 12/15/2017 FINDINGS: Monitoring leads overlie the patient. Stable cardiac and mediastinal contours. No consolidative pulmonary opacities. No pleural effusion or pneumothorax. Thoracic spine degenerative changes. IMPRESSION: No acute cardiopulmonary process. Electronically Signed   By: Lovey Newcomer M.D.   On: 12/18/2017 13:39   US Renal  Result Date: 12/16/2017 CLINICAL DATA:  Acute renal failure. EXAM: RENAL / URINARY TRACT ULTRASOUND COMPLETE COMPARISON:  CT, 10/07/2016  FINDINGS: Right Kidney: Length: 11.1 cm. Slight prominence of the right renal pelvis, but no convincing hydronephrosis. No renal masses or stones. Left Kidney: Length: 10.9 cm. Echogenicity within normal limits. No mass or hydronephrosis visualized. Bladder: Appears normal for degree of bladder distention. IMPRESSION: Normal renal ultrasound. Electronically Signed   By: Lajean Manes M.D.   On: 12/16/2017 12:05   Dg Abd Acute W/chest  Result Date: 12/15/2017 CLINICAL DATA:  Abdominal pain.  Nausea and vomiting for 2 weeks. EXAM: DG ABDOMEN ACUTE W/ 1V CHEST COMPARISON:  11/13/2017 FINDINGS: Heart size is normal. The lungs are free of focal consolidations and pleural effusions. There is no pulmonary edema. Mildly prominent interstitial markings appear stable. There is no free air beneath the diaphragm. Bowel gas pattern is nonobstructed. Calcifications in the pelvis are consistent with uterine fibroids. There are degenerative changes in the hips. IMPRESSION: Negative abdominal radiographs. No acute cardiopulmonary disease. Calcified uterine fibroids. Electronically Signed   By: Nolon Nations M.D.   On: 12/15/2017 12:39    EGD:- Benign-appearing esophageal stenosis. Dilated.                           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                            Dilated. Biopsied.                           - MILD Duodenitis. Biopsied.     Subjective: Feeling better today.  No vomiting.  Tolerating solid diet.  Discharge Exam: Vitals:   12/22/17 0733 12/22/17 1517  BP:  114/66  Pulse:  72  Resp:  16  Temp:  98.5 F (36.9 C)  SpO2: 99% 100%   Vitals:   12/22/17 0631 12/22/17 0631 12/22/17 0733 12/22/17 1517  BP: 124/81 124/81  114/66  Pulse:  74  72  Resp:  18  16  Temp:  98.8 F (37.1 C)  98.5 F (36.9 C)  TempSrc:  Oral  Oral  SpO2:  100% 99% 100%  Weight:  78.3 kg (172 lb 9.9 oz)    Height:        General: Pt is alert, awake, not in acute  distress Cardiovascular: RRR, S1/S2 +, no rubs, no gallops Respiratory: CTA bilaterally, no wheezing, no rhonchi Abdominal: Soft, NT, ND, bowel sounds + Extremities: no edema, no cyanosis    The results of significant diagnostics from this hospitalization (including imaging, microbiology, ancillary and laboratory) are listed below for reference.  Microbiology: No results found for this or any previous visit (from the past 240 hour(s)).   Labs: BNP (last 3 results) No results for input(s): BNP in the last 8760 hours. Basic Metabolic Panel: Recent Labs  Lab 12/16/17 0458 12/17/17 0736 12/18/17 0734 12/19/17 0600 12/20/17 0547 12/21/17 0546  NA 133* 137 131* 131* 130* 132*  K 4.0 3.8 3.5 3.6 3.4* 3.4*  CL 106 109 96* 100* 100* 103  CO2 20* 19* 20* 20* 20* 22  GLUCOSE 92 110* 121* 119* 100* 94  BUN 31* 12 16 20 20 14   CREATININE 1.59* 1.04* 1.68* 1.39* 1.20* 0.97  CALCIUM 9.8 9.9 11.2* 10.2 9.6 9.6  MG 2.0 1.3* 2.4 1.9  --   --    Liver Function Tests: Recent Labs  Lab 12/17/17 0736 12/18/17 0734 12/19/17 0600 12/20/17 0547 12/21/17 0546  AST 17 20 22 19 21   ALT 15 17 21 21 23   ALKPHOS 49 67 56 49 42  BILITOT 1.7* 3.0* 2.8* 3.0* 2.5*  PROT 7.6 9.4* 8.0 7.0 6.3*  ALBUMIN 4.0 4.9 4.2 3.7 3.4*   No results for input(s): LIPASE, AMYLASE in the last 168 hours. No results for input(s): AMMONIA in the last 168 hours. CBC: Recent Labs  Lab 12/16/17 0458 12/17/17 0736 12/18/17 0734 12/19/17 0600 12/20/17 0547 12/21/17 0546 12/22/17 0502  WBC 5.7 5.8 14.2* 12.1* 8.6 7.4 7.1  NEUTROABS 3.7 4.7 12.3* 10.2* 6.3  --   --   HGB 12.9 13.4 15.7* 15.2* 13.3 11.6* 11.1*  HCT 37.5 39.0 44.1 42.5 38.3 33.6* 32.0*  MCV 89.9 90.5 87.8 88.2 89.3 88.9 88.6  PLT 214 198 176 124* 85* 75* 85*   Cardiac Enzymes: No results for input(s): CKTOTAL, CKMB, CKMBINDEX, TROPONINI in the last 168 hours. BNP: Invalid input(s): POCBNP CBG: No results for input(s): GLUCAP in the last  168 hours. D-Dimer No results for input(s): DDIMER in the last 72 hours. Hgb A1c No results for input(s): HGBA1C in the last 72 hours. Lipid Profile No results for input(s): CHOL, HDL, LDLCALC, TRIG, CHOLHDL, LDLDIRECT in the last 72 hours. Thyroid function studies No results for input(s): TSH, T4TOTAL, T3FREE, THYROIDAB in the last 72 hours.  Invalid input(s): FREET3 Anemia work up Recent Labs    12/21/17 1843  VITAMINB12 5,218*   Urinalysis    Component Value Date/Time   COLORURINE YELLOW 12/15/2017 1114   APPEARANCEUR HAZY (A) 12/15/2017 1114   LABSPEC 1.017 12/15/2017 1114   PHURINE 5.0 12/15/2017 1114   GLUCOSEU NEGATIVE 12/15/2017 1114   [place] 12/15/2017 [place] 12/15/2017 1114   KETONESUR NEGATIVE 12/15/2017 1114   PROTEINUR 30 (A) 12/15/2017 1114   UROBILINOGEN 0.2 04/01/2015 1140   NITRITE NEGATIVE 12/15/2017 1114   LEUKOCYTESUR NEGATIVE 12/15/2017 1114   Sepsis Labs Invalid input(s): PROCALCITONIN,  WBC,  LACTICIDVEN Microbiology No results found for this or any previous visit (from the past 240 hour(s)).   Time coordinating discharge: 30mins  SIGNED:   Kathie Dike, MD  [facility] 12/22/2017, 5:00 PM Pager   If 7PM-7AM, please contact night-coverage [email or web address] Password [email or web address]

## 2017-12-23 ENCOUNTER — Telehealth: Payer: Self-pay | Admitting: Nurse Practitioner

## 2017-12-23 DIAGNOSIS — R112 Nausea with vomiting, unspecified: Secondary | ICD-10-CM

## 2017-12-23 DIAGNOSIS — K311 Adult hypertrophic pyloric stenosis: Secondary | ICD-10-CM

## 2017-12-23 NOTE — Telephone Encounter (Signed)
Called patient, NA, VM is full. 

## 2017-12-23 NOTE — Telephone Encounter (Signed)
Patient needs GES in 2 weeks. I put the order in, please contact the patient to let her know and help with scheduling.

## 2017-12-27 NOTE — Telephone Encounter (Signed)
Patient is fine with scheduling GES

## 2017-12-27 NOTE — Telephone Encounter (Addendum)
GES scheduled for 01/11/18 at 8:00am, nothing to eat or drink after midnight prior to test, no stomach medications morning of test. Patient aware of appt information/details. Nothing further needed. Letter mailed also with appointment information  Checked online with [facility] for PA and received "code not found". Therefore no PA is needed

## 2017-12-28 DIAGNOSIS — M13862 Other specified arthritis, left knee: Secondary | ICD-10-CM | POA: Diagnosis not present

## 2017-12-28 DIAGNOSIS — M461 Sacroiliitis, not elsewhere classified: Secondary | ICD-10-CM | POA: Diagnosis not present

## 2017-12-28 DIAGNOSIS — Z79891 Long term (current) use of opiate analgesic: Secondary | ICD-10-CM | POA: Diagnosis not present

## 2017-12-28 DIAGNOSIS — G894 Chronic pain syndrome: Secondary | ICD-10-CM | POA: Diagnosis not present

## 2017-12-28 NOTE — Progress Notes (Signed)
Letter mailed to pt with results

## 2017-12-29 ENCOUNTER — Other Ambulatory Visit: Payer: Self-pay | Admitting: *Deleted

## 2017-12-29 ENCOUNTER — Other Ambulatory Visit: Payer: Self-pay

## 2017-12-29 ENCOUNTER — Ambulatory Visit (INDEPENDENT_AMBULATORY_CARE_PROVIDER_SITE_OTHER): Payer: Medicare HMO | Admitting: Gastroenterology

## 2017-12-29 ENCOUNTER — Encounter: Payer: Self-pay | Admitting: Gastroenterology

## 2017-12-29 VITALS — BP 130/78 | HR 67 | Temp 99.0°F | Ht 66.0 in | Wt 174.2 lb

## 2017-12-29 DIAGNOSIS — K5900 Constipation, unspecified: Secondary | ICD-10-CM

## 2017-12-29 DIAGNOSIS — R112 Nausea with vomiting, unspecified: Secondary | ICD-10-CM

## 2017-12-29 DIAGNOSIS — K219 Gastro-esophageal reflux disease without esophagitis: Secondary | ICD-10-CM | POA: Diagnosis not present

## 2017-12-29 DIAGNOSIS — N179 Acute kidney failure, unspecified: Secondary | ICD-10-CM | POA: Diagnosis not present

## 2017-12-29 DIAGNOSIS — Z800 Family history of malignant neoplasm of digestive organs: Secondary | ICD-10-CM | POA: Diagnosis not present

## 2017-12-29 DIAGNOSIS — K7460 Unspecified cirrhosis of liver: Secondary | ICD-10-CM

## 2017-12-29 DIAGNOSIS — R109 Unspecified abdominal pain: Secondary | ICD-10-CM | POA: Diagnosis not present

## 2017-12-29 DIAGNOSIS — K7469 Other cirrhosis of liver: Secondary | ICD-10-CM | POA: Diagnosis not present

## 2017-12-29 NOTE — Progress Notes (Signed)
cc'd to pcp 

## 2017-12-29 NOTE — Patient Instructions (Signed)
Continue Trulance for constipation. Continue Protonix twice a day.   Keep the appointment for the gastric emptying study.   You only need a colonoscopy upcoming now, and we will cancel the upper endoscopy as you just had this.  Please let us know if you start having weight loss, lack of appetite, further abdominal pain.  We will see you in August 2019!  I am so glad you are doing better!  It was a pleasure to see you today. I strive to create trusting relationships with patients to provide genuine, compassionate, and quality care. I value your feedback. If you receive a survey regarding your visit,  I greatly appreciate you taking time to fill this out.   Annitta Needs, PhD, ANP-BC [facility]

## 2017-12-29 NOTE — Assessment & Plan Note (Addendum)
secondary to ETOH, history of HCV s/p treatment and achieving SVR. Due for [place] in Aug 2019. Return in Aug 2019 and will arrange.

## 2017-12-29 NOTE — Assessment & Plan Note (Signed)
Inpatient recently with EGD s/p dilation of benign-appearing esophageal stesnosis, mild pyloric stenosis s/p dilation, mild duodenitis. GES was unable to be completed due to vomiting. GES scheduled for mid June. Symptomatically much improved. Remains on PPI BID and Zofran. Return for close follow-up in Aug 2019. As of note, history of median arcuate ligament syndrome, s/p mesenteric duplex April 2019 documenting patent vasculature of SMA, unable to visualize IMA, celiac velocities normal but increase with expiration, consistent with known median arcuate ligament syndrome. Weight improving from prior visit. No concerning clinical features. Continue close follow-up.

## 2017-12-29 NOTE — Progress Notes (Signed)
Primary Care Physician:  Rosita Fire, MD  Primary GI: Dr. Gala Romney   Chief Complaint  Patient presents with  . Follow-up    GES scheduled for 01/11/18    HPI:   Colleen Lee is a 62 y.o. female presenting today in hospital follow-up. She was inpatient recently with acute on chronic abdominal pain, N/V, diarrhea. Past GI history also significant for cirrhosis secondary to ETOH, history of HCV s/p treatment and achieving SVR. Due for [place] in Aug 2019. 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 While inpatient during hospitalization, she underwent an EGD due to inability to advance diet. This revealed a benign appearing esophageal stenosis s/p dilation, mild pyloric stenosis s/p dilation, mild duodenitis. GES was unable to be completed due to vomiting. This is scheduled in June 2019 to wrap up upper GI evaluation.   She states she is 100% better. No vomiting. Tolerating diet. Feels much improved from hospital admission. Dealt with constipation historically. Has been on Trulance as outpatient. Zofran TID. No further vomiting. Protonix BID.   Last colonoscopy in 2014 and due for surveillance now; she also has a family history of colon cancer (Brother in his 61s).   Past Medical History:  Diagnosis Date  . Asthma   . Chronic abdominal pain   . Chronic back pain   . Chronic constipation   . Cirrhosis ([place])    Metavir score F4 on elastography 2015  . Cyclical vomiting   . Fibroids   . Fibromyalgia   . GERD (gastroesophageal reflux disease)   . H. pylori infection 2014   treated with pylera, had to be treated again as it was not eradicated. Urea breath test then negative after subsequent treatment.   . [name]    HCV RNA positive 09/2012  . Hypertension   . Marijuana  use   . Nausea and vomiting    chronic, recurrent  . PONV (postoperative nausea and vomiting)    pt thinks maybe once she had N&V  . Sciatica of left side     Past Surgical History:  Procedure Laterality Date  . BALLOON DILATION  12/20/2017   Procedure: BALLOON DILATION;  Surgeon: Danie Binder, MD;  [facility];  Service: Endoscopy;;  pyloric dilation  . BIOPSY  12/20/2017   Procedure: BIOPSY;  Surgeon: Danie Binder, MD;  [facility]: [facility];  Service: Endoscopy;;  duodenal and gastric biopsy  . COLONOSCOPY  01/18/2008   GMW:NUUVOZ rectum.  Long redundant colon, a diminutive sigmoid polyp status post cold biopsy removed. Hyperplastic polyp. Repeat colonoscopy June 2014 due to family history of colon cancer  . COLONOSCOPY WITH ESOPHAGOGASTRODUODENOSCOPY (EGD) N/A 11/02/2012   DGU:[id number] gastric mucosa of doubtful, +H.pylori. Incomplete colonoscopy due to patient unable to tolerate exam, proximal colon seen. Patient refused ACBE.  Marland Kitchen COLONOSCOPY WITH PROPOFOL N/A 03/08/2013   Dr. Gala Romney: colonic polyp-removed as scribed above. Internal Hemorrhoids. Pathology did not reveal any colonic tissue, only mucus. SURVEILLANCE DUE Aug 2019  . ESOPHAGEAL DILATION  12/20/2017   Procedure: ESOPHAGEAL DILATION;  Surgeon: Danie Binder, MD;  [facility];  Service: Endoscopy;;  . ESOPHAGOGASTRODUODENOSCOPY  01/18/2008   RMR: Normal esophagus, normal  stomach  . ESOPHAGOGASTRODUODENOSCOPY (EGD) WITH PROPOFOL N/A 02/09/2016   Normal esophagus, small hiatal hernia, portal hypertensive  gastropathy, normal second portion of duodenum. Due in July 2019  . ESOPHAGOGASTRODUODENOSCOPY (EGD) WITH PROPOFOL N/A 12/20/2017   Dr. Oneida Alar: benign appearing esophageal stenosis s/p dilation, mild pyloric stenosis s/p biopsy and dilation, mild duodenitis.   Marland Kitchen HYSTEROSCOPY  05/11/2016   Procedure: HYSTEROSCOPY;  Surgeon: Jonnie Kind, MD;  Location: [facility];  Service: Gynecology;;  . KNEE SURGERY      left knee  . MULTIPLE EXTRACTIONS WITH ALVEOLOPLASTY N/A 10/15/2013   Procedure: MULTIPLE EXTRACTION WITH ALVEOLOPLASTY;  Surgeon: Gae Bon, DDS;  Location: [facility];  Service: Oral Surgery;  Laterality: N/A;  . POLYPECTOMY N/A 03/08/2013   Procedure: POLYPECTOMY;  Surgeon: Daneil Dolin, MD;  Location: [facility];  Service: Endoscopy;  Laterality: N/A;  . POLYPECTOMY N/A 05/11/2016   Procedure: REMOVAL OF ENDOMETRIAL POLYP;  Surgeon: Jonnie Kind, MD;  Location: [facility];  Service: Gynecology;  Laterality: N/A;  . TOTAL KNEE ARTHROPLASTY Left 02/05/2015   Procedure: LEFT TOTAL KNEE ARTHROPLASTY;  Surgeon: Carole Civil, MD;  Location: [facility];  Service: Orthopedics;  Laterality: Left;    Current Outpatient Medications  Medication Sig Dispense Refill  . amLODipine (NORVASC) 10 MG tablet Take 1 tablet (10 mg total) by mouth daily. 30 tablet 0  . budesonide-formoterol (SYMBICORT) 160-4.5 MCG/ACT inhaler Inhale 2 puffs into the lungs 2 (two) times daily.    . Cyanocobalamin (VITAMIN B 12 PO) Take 1 tablet by mouth daily.    . DULoxetine (CYMBALTA) 30 MG capsule Take 1 capsule by mouth 2 (two) times daily.    . ergocalciferol (VITAMIN D2) 50000 units capsule Take 50,000 Units by mouth every 30 (thirty) days.    Marland Kitchen GAVILYTE-G 236 g solution as needed.     Marland Kitchen HYDROcodone-acetaminophen (NORCO/VICODIN) 5-325 MG tablet Take 1 tablet by mouth as needed for moderate pain.     . hydrOXYzine (ATARAX/VISTARIL) 10 MG tablet     . megestrol (MEGACE) 40 MG tablet Take 40 mg by mouth daily.    . metoprolol tartrate (LOPRESSOR) 25 MG tablet Take 1 tablet (25 mg total) by mouth 2 (two) times daily. 60 tablet 0  . ondansetron (ZOFRAN ODT) 4 MG disintegrating tablet Take 1 tablet (4 mg total) by mouth 3 (three) times daily. '4mg'$  ODT q4 hours prn nausea/vomit 60 tablet 0  . pantoprazole (PROTONIX) 40 MG tablet Take 1 tablet (40 mg total) by mouth 2 (two) times daily before a meal. 60 tablet 0  . Plecanatide  (TRULANCE) 3 MG TABS Take 1 tablet by mouth daily. 30 tablet 5   No current facility-administered medications for this visit.     Allergies as of 12/29/2017 - Review Complete 12/29/2017  Allergen Reaction Noted  . Penicillins Rash     Family History  Problem Relation Age of Onset  . Colon cancer Brother 41          . Multiple myeloma Brother   . Liver cancer Sister   . Prostate cancer Brother   . Pancreatic cancer Brother   . Cancer Mother        breast  . Asthma Mother     Social History   Socioeconomic History  . Marital status: Single    Spouse name: Not on file  . Number of children: 0  . Years of education: Not on file  . Highest education level: Not on file  Occupational History  . Occupation: umemployed   Social Needs  . Financial resource strain: Not on file  . Food  insecurity:    Worry: Not on file    Inability: Not on file  . Transportation needs:    Medical: Not on file    Non-medical: Not on file  Tobacco Use  . Smoking status: Current Every Day Smoker    Packs/day: 0.50    Years: 40.00    Pack years: 20.00    Types: Cigarettes  . Smokeless tobacco: Never Used  . Tobacco comment: Smokes one pack of cigarettes daily  Substance and Sexual Activity  . Alcohol use: No    Alcohol/week: 0.0 oz  . Drug use: No    Types: Marijuana    Comment: history of marijuana in past  . Sexual activity: Never    Birth control/protection: None  Lifestyle  . Physical activity:    Days per week: Not on file    Minutes per session: Not on file  . Stress: Not on file  Relationships  . Social connections:    Talks on phone: Not on file    Gets together: Not on file    Attends religious service: Not on file    Active member of club or organization: Not on file    Attends meetings of clubs or organizations: Not on file    Relationship status: Not on file  Other Topics Concern  . Not on file  Social History Narrative  . Not on file    Review of Systems: Gen:  Denies fever, chills, anorexia. Denies fatigue, weakness, weight loss.  CV: Denies chest pain, palpitations, syncope, peripheral edema, and claudication. Resp: Denies dyspnea at rest, cough, wheezing, coughing up blood, and pleurisy. GI: see HPI  Derm: Denies rash, itching, dry skin Psych: Denies depression, anxiety, memory loss, confusion. No homicidal or suicidal ideation.  Heme: Denies bruising, bleeding, and enlarged lymph nodes.  Physical Exam: BP 130/78   Pulse 67   Temp 99 F (37.2 C) (Oral)   Ht '5\' 6"'$  (1.676 m)   Wt 174 lb 3.2 oz (79 kg)   BMI 28.12 kg/m  General:   Alert and oriented. No distress noted. Pleasant and cooperative.  Head:  Normocephalic and atraumatic. Eyes:  Conjuctiva clear without scleral icterus. Mouth:  Oral mucosa pink and moist.  Lungs: CTAB Cardiac: S1 S2 present without murmur  Abdomen:  +BS, distended but soft, non-tender. No rebound or guarding. No HSM or masses noted. Msk:  Symmetrical without gross deformities. Normal posture. Extremities:  Without edema. Neurologic:  Alert and  oriented x4 Psych:  Alert and cooperative. Normal mood and affect.

## 2017-12-29 NOTE — Patient Outreach (Signed)
[facility]) Care Management  12/29/2017  Colleen Lee 1955/09/07 [id number]  Colleen. Colleen Lee is a 62 year old female with medical history which includes H. Pylori infection, hepatic cirrhosis, alcoholic cirrhosis of the liver, hypertension, GERD, and pyloric stenosis. On 12/20/17 Colleen Lee presented to her PCP office with complaints of 2 weeks nausea and vomiting She was advised to report to the ED for management of hypotension and further evaluation of her GI symptoms. Colleen Lee was treated for acute renal failure secondary to prerenal causes (dehydration), was treated with IV hydration and improved with recovered blood pressure. 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 She was discharged on 12/22/17 and recommendations were given for her to follow up with her PCP in 1-2 weeks and her gastroenterologist as per his recommendation.   I reached out to Colleen Lee today to follow up on her progress and to attend to transition of care and care coordination needs. Colleen Lee states she is feeling much better. She is to see Dr. Legrand Rams in the office today. She denies dizziness, chest pain, light headedness, or other cardiovascular symptom. She states she has not had recurrent GI symptoms and is not having difficulty with swallowing. Colleen Lee is taking all medications as prescribed. Colleen Lee is scheduled for a gastric emptying study on 01/11/18 and has transportation to her appointment. She has a basic understanding of he prescribed low residue diet but agreed to allow me to provide further education and print educational materials regarding low residue diet and gastric emptying study.   Plan: I will continue to follow up with Colleen Lee for transition of care assessments.   [facility] CM Care Plan Problem One     Most Recent Value  Care Plan Problem One   Knowledge Deficits related to long term plan of care for management of GI disorder  Role Documenting the Problem One  Care Management Telephonic Coordinator  Care Plan for Problem One  Active  [facility] Long Term Goal   Over the next 60 days, patient will verbalize understanding of long term plan of care for managemnet of GI disorder  [facility] Long Term Goal Start Date  12/29/17  Interventions for Problem One Long Term Goal  reviewed d/c instructions, provider appointment plans, prescribed diet, medications, and plan of care  [facility] CM Short Term Goal #1   Over the next 14 days, patient will attend all scheduled provider appointments and scheduled procedure  [facility] CM Short Term Goal #1 Start Date  12/29/17  Interventions for Short Term Goal #1  reviewed upcoming provider appointment schedule,  ensured transportation  [facility] CM Short Term Goal #2   Over the next 30 days, patient will verbalize understanding of prescribed modified diet as part of treatment plan for GI disorder  [facility] CM Short Term Goal #2 Start Date  12/29/17  Interventions for Short Term Goal #2  reviewed prescribed diet with patient,  provided print educational materials  [facility] CM Short Term Goal #3  Over the next 30 days, patient will verbalize understanding of signs and symptoms which need to be reproted to provider  [facility] CM Short Term Goal #3 Start Date  12/29/17  Interventions for Short Tern Goal #3  reviewed early GI signs and symptoms to be reported to provider     Janalyn Shy [facility]  [phone]

## 2017-12-29 NOTE — Assessment & Plan Note (Signed)
62 year old female with family history of colon cancer (brother), with last colonoscopy in 2014. Needs colonoscopy now. No concerning lower GI symptoms. Constipation well-managed with Trulance daily.  Proceed with TCS with Dr. Gala Romney in near future: the risks, benefits, and alternatives have been discussed with the patient in detail. The patient states understanding and desires to proceed. Propofol due to polypharmacy Return in Aug 2019

## 2017-12-29 NOTE — Assessment & Plan Note (Signed)
Continue Trulance daily

## 2017-12-30 NOTE — Patient Instructions (Signed)
Your health care provider might recommend that you follow a temporary low residue diet (LRD) if you are recovering from recent bowel surgery (e.g., ileostomy, colostomy, resection), preparing for a colonoscopy, or experiencing heightened symptoms of abdominal pain, cramping, diarrhea, or active digestive flare-ups associated with a gastrointestinal condition, such as Crohn's or diverticular disease. The term 'residue' refers to any solid contents that end up in the large intestine after digestion. This includes undigested and unabsorbed food (which consists mostly of dietary fibre), bacteria, and gastric secretions.1 A low residue diet limits dietary fibre to less than 10-15g per day and restricts other foods that could stimulate bowel activity. The goal of a LRD is to decrease the size and frequency of bowel movements in order to reduce painful symptoms. It is similar to a low fibre diet (LFD) except that a LRD also limits some other foods, such as milk, which can increase colonic residue and stool weight.2 Foods Allowed on a Low Residue Diet refined grain products like white breads, cereals, and pastas (look for less than 2g of fibre per serving on label)  white rice  juices without pulp or seeds  meats, fish, and eggs  oil, margarine, butter, mayonnaise, and salad dressings  fruit without peels or seeds and certain canned or well-cooked fruit (e.g., peeled apples, seedless peeled grapes, banana, cantaloupe, etc.)  some soft, cooked vegetables (e.g., beets, beans, carrots, cucumber, eggplant, mushrooms, etc.)  limit of 2 cups/day: milk, yogurt, puddings, cream based soups Foods to Avoid on a Low Residue Diet whole grain breads, cereals, and pastas (e.g., oatmeal, millet, buckwheat, flax, popcorn)  raw vegetables  the following vegetables, whether cooked or raw: broccoli, cauliflower, Brussels sprouts, cabbage, kale, Swiss chard  dried fruit, berries, and other fruit with skin or seeds  tough meats  with gristle  crunchy peanut butter (smooth is okay)  seeds and nuts  dried beans, peas, and lentils Limitations of the Low Residue Diet The LRD may be beneficial for symptom management during heightened or acute episodes of increased abdominal pain, infection, or inflammation. However, be aware that this diet is not recommended for all those suffering with inflammatory bowel disease or other chronic conditions. LRD will not decrease inflammation nor will it improve the underlying cause of your condition. Following a LRD for a prolonged period might lead to nutrient deficiencies and other gastrointestinal symptoms (e.g., constipation). [employer] Your physician or dietitian can help you decide if this diet is right for you and the appropriate length of time for you to follow it. When transitioning from a LRD to your regular diet, be sure to increase fibre gradually, by about 5 grams weekly, until you have reached your fibre goal. It is also important to drink plenty of liquids when increasing dietary fibre.

## 2018-01-02 DIAGNOSIS — I10 Essential (primary) hypertension: Secondary | ICD-10-CM | POA: Diagnosis not present

## 2018-01-02 DIAGNOSIS — K219 Gastro-esophageal reflux disease without esophagitis: Secondary | ICD-10-CM | POA: Diagnosis not present

## 2018-01-02 DIAGNOSIS — M549 Dorsalgia, unspecified: Secondary | ICD-10-CM | POA: Diagnosis not present

## 2018-01-02 DIAGNOSIS — F17200 Nicotine dependence, unspecified, uncomplicated: Secondary | ICD-10-CM | POA: Diagnosis not present

## 2018-01-02 DIAGNOSIS — B182 Chronic viral hepatitis C: Secondary | ICD-10-CM | POA: Diagnosis not present

## 2018-01-02 DIAGNOSIS — Z6831 Body mass index (BMI) 31.0-31.9, adult: Secondary | ICD-10-CM | POA: Diagnosis not present

## 2018-01-02 DIAGNOSIS — E7849 Other hyperlipidemia: Secondary | ICD-10-CM | POA: Diagnosis not present

## 2018-01-02 DIAGNOSIS — M25562 Pain in left knee: Secondary | ICD-10-CM | POA: Diagnosis not present

## 2018-01-02 DIAGNOSIS — J449 Chronic obstructive pulmonary disease, unspecified: Secondary | ICD-10-CM | POA: Diagnosis not present

## 2018-01-06 ENCOUNTER — Other Ambulatory Visit: Payer: Self-pay | Admitting: *Deleted

## 2018-01-06 NOTE — Patient Outreach (Signed)
[facility]) Care Management  01/06/2018  Colleen Lee 1955-10-14 [id number]  [name] is a 62 year old female with medical history which includes H. Pylori infection, hepatic cirrhosis, alcoholic cirrhosis of the liver, hypertension, GERD, and pyloric stenosis. On 12/20/17 [name] presented to her PCP office with complaints of 2 weeks nausea and vomiting She was advised to report to the ED for management of hypotension and further evaluation of her GI symptoms. [name] was treated for acute renal failure secondary to prerenal causes (dehydration), was treated with IV hydration and improved with recovered blood pressure. During her hospitalization, 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 She was discharged on 12/22/17 and recommendations were given for her to follow up with her PCP in 1-2 weeks and her gastroenterologist as per his recommendation.   [name] saw Dr. Legrand Rams, her PCP, in the office on 12/21/17 and followed up with GI on 12/29/17. At her GI appointment, recommendation was made for [name] to continue PPI BID and Zofran prn and for her to follow up in August 2019.   I spoke with [name] today and she verbalizes understanding of pre-procedure instructions for gastric emptying study on 01/11/18. She has transportation to her appointment.   Today, [name] denies new or worsening symptoms. She stated she was resting when I called and would welcome a return call and follow up after her procedure next week.    Plan: I will transition [name]'s case to my colleague for ongoing transition of care services and care coordination.   [facility] CM Care Plan Problem One     Most Recent Value  Care Plan Problem One  Knowledge Deficits related to long term plan of care for management of GI disorder  Role Documenting the Problem One  Care Management Telephonic  Coordinator  Care Plan for Problem One  Active  [facility] Long Term Goal   Over the next 60 days, patient will verbalize understanding of long term plan of care for managemnet of GI disorder  [facility] Long Term Goal Start Date  12/29/17  Interventions for Problem One Long Term Goal  assessed patient understanding of GI plan of care as recently updated by GI provider  [facility] CM Short Term Goal #1   Over the next 14 days, patient will attend all scheduled provider appointments and scheduled procedure  [facility] CM Short Term Goal #1 Start Date  12/29/17  Interventions for Short Term Goal #1  reviewed attended scheduled appointment and upcoming procedure appointment  [facility] CM Short Term Goal #2   Over the next 30 days, patient will verbalize understanding of prescribed modified diet as part of treatment plan for GI disorder  [facility] CM Short Term Goal #2 Start Date  12/29/17  Interventions for Short Term Goal #2  confirmed receipt of educational materials sent  [facility] CM Short Term Goal #3  Over the next 30 days, patient will verbalize understanding of signs and symptoms which need to be reproted to provider  [facility] CM Short Term Goal #3 Start Date  12/29/17  Interventions for Short Tern Goal #3  assessed current symptoms and status      [facility]  [phone]

## 2018-01-09 DIAGNOSIS — I10 Essential (primary) hypertension: Secondary | ICD-10-CM | POA: Diagnosis not present

## 2018-01-09 DIAGNOSIS — R609 Edema, unspecified: Secondary | ICD-10-CM | POA: Diagnosis not present

## 2018-01-11 ENCOUNTER — Ambulatory Visit (HOSPITAL_COMMUNITY)
Admission: RE | Admit: 2018-01-11 | Discharge: 2018-01-11 | Disposition: A | Discharge status: Home / Self Care | Payer: Medicare HMO | Source: Ambulatory Visit | Attending: Nurse Practitioner | Admitting: Nurse Practitioner

## 2018-01-11 DIAGNOSIS — K311 Adult hypertrophic pyloric stenosis: Secondary | ICD-10-CM | POA: Diagnosis not present

## 2018-01-11 DIAGNOSIS — R112 Nausea with vomiting, unspecified: Secondary | ICD-10-CM

## 2018-01-11 MED ORDER — TECHNETIUM TC 99M SULFUR COLLOID
2.0000 | Freq: Once | INTRAVENOUS | Status: AC | PRN
  Administered 2018-01-11: 2.1 via ORAL

## 2018-01-12 ENCOUNTER — Encounter (HOSPITAL_COMMUNITY)
Admission: RE | Admit: 2018-01-12 | Discharge: 2018-01-12 | Disposition: A | Discharge status: Home / Self Care | Payer: Medicare HMO | Source: Ambulatory Visit | Attending: Internal Medicine | Admitting: Internal Medicine

## 2018-01-12 ENCOUNTER — Encounter (HOSPITAL_COMMUNITY): Payer: Self-pay

## 2018-01-12 ENCOUNTER — Other Ambulatory Visit (HOSPITAL_COMMUNITY): Payer: Medicare HMO

## 2018-01-19 ENCOUNTER — Ambulatory Visit (HOSPITAL_COMMUNITY): Payer: Medicare HMO | Admitting: Anesthesiology

## 2018-01-19 ENCOUNTER — Encounter (HOSPITAL_COMMUNITY)
Admission: RE | Disposition: A | Discharge status: Home / Self Care | Payer: Self-pay | Source: Ambulatory Visit | Attending: Internal Medicine

## 2018-01-19 ENCOUNTER — Ambulatory Visit (HOSPITAL_COMMUNITY)
Admission: RE | Admit: 2018-01-19 | Discharge: 2018-01-19 | Disposition: A | Discharge status: Home / Self Care | Payer: Medicare HMO | Source: Ambulatory Visit | Attending: Internal Medicine | Admitting: Internal Medicine

## 2018-01-19 ENCOUNTER — Encounter (HOSPITAL_COMMUNITY): Payer: Self-pay

## 2018-01-19 ENCOUNTER — Ambulatory Visit (HOSPITAL_COMMUNITY): Admit: 2018-01-19 | Payer: Medicare HMO | Admitting: Internal Medicine

## 2018-01-19 DIAGNOSIS — K5909 Other constipation: Secondary | ICD-10-CM | POA: Insufficient documentation

## 2018-01-19 DIAGNOSIS — Z79818 Long term (current) use of other agents affecting estrogen receptors and estrogen levels: Secondary | ICD-10-CM | POA: Insufficient documentation

## 2018-01-19 DIAGNOSIS — Z96652 Presence of left artificial knee joint: Secondary | ICD-10-CM | POA: Insufficient documentation

## 2018-01-19 DIAGNOSIS — Q439 Congenital malformation of intestine, unspecified: Secondary | ICD-10-CM | POA: Diagnosis not present

## 2018-01-19 DIAGNOSIS — D120 Benign neoplasm of cecum: Secondary | ICD-10-CM | POA: Diagnosis not present

## 2018-01-19 DIAGNOSIS — Z7951 Long term (current) use of inhaled steroids: Secondary | ICD-10-CM | POA: Diagnosis not present

## 2018-01-19 DIAGNOSIS — Z1211 Encounter for screening for malignant neoplasm of colon: Secondary | ICD-10-CM | POA: Insufficient documentation

## 2018-01-19 DIAGNOSIS — Z79899 Other long term (current) drug therapy: Secondary | ICD-10-CM | POA: Diagnosis not present

## 2018-01-19 DIAGNOSIS — J449 Chronic obstructive pulmonary disease, unspecified: Secondary | ICD-10-CM | POA: Diagnosis not present

## 2018-01-19 DIAGNOSIS — K635 Polyp of colon: Secondary | ICD-10-CM | POA: Diagnosis not present

## 2018-01-19 DIAGNOSIS — K5140 Inflammatory polyps of colon without complications: Secondary | ICD-10-CM | POA: Diagnosis not present

## 2018-01-19 DIAGNOSIS — K219 Gastro-esophageal reflux disease without esophagitis: Secondary | ICD-10-CM | POA: Insufficient documentation

## 2018-01-19 DIAGNOSIS — F17210 Nicotine dependence, cigarettes, uncomplicated: Secondary | ICD-10-CM | POA: Diagnosis not present

## 2018-01-19 DIAGNOSIS — Z800 Family history of malignant neoplasm of digestive organs: Secondary | ICD-10-CM | POA: Insufficient documentation

## 2018-01-19 DIAGNOSIS — I10 Essential (primary) hypertension: Secondary | ICD-10-CM | POA: Insufficient documentation

## 2018-01-19 HISTORY — PX: COLONOSCOPY WITH PROPOFOL: SHX5780

## 2018-01-19 SURGERY — COLONOSCOPY WITH PROPOFOL
Anesthesia: General

## 2018-01-19 SURGERY — ESOPHAGOGASTRODUODENOSCOPY (EGD) WITH PROPOFOL
Anesthesia: Monitor Anesthesia Care

## 2018-01-19 MED ORDER — PROPOFOL 500 MG/50ML IV EMUL
INTRAVENOUS | Status: DC | PRN
  Administered 2018-01-19: 100 ug/kg/min via INTRAVENOUS
  Administered 2018-01-19: 10:00:00 via INTRAVENOUS

## 2018-01-19 MED ORDER — MIDAZOLAM HCL 2 MG/2ML IJ SOLN
INTRAMUSCULAR | Status: AC
  Filled 2018-01-19: qty 2

## 2018-01-19 MED ORDER — LACTATED RINGERS IV SOLN
INTRAVENOUS | Status: DC | PRN
  Administered 2018-01-19: 09:00:00 via INTRAVENOUS

## 2018-01-19 MED ORDER — FENTANYL CITRATE (PF) 100 MCG/2ML IJ SOLN
25.0000 ug | INTRAMUSCULAR | Status: DC | PRN
Start: 2018-01-19 — End: 2018-01-19

## 2018-01-19 MED ORDER — MIDAZOLAM HCL 5 MG/5ML IJ SOLN
INTRAMUSCULAR | Status: DC | PRN
  Administered 2018-01-19 (×2): 1 mg via INTRAVENOUS

## 2018-01-19 MED ORDER — HYDROCODONE-ACETAMINOPHEN 7.5-325 MG PO TABS: 1.0000 | ORAL_TABLET | Freq: Once | ORAL | Status: DC | PRN

## 2018-01-19 MED ORDER — PROPOFOL 10 MG/ML IV BOLUS
INTRAVENOUS | Status: AC
  Filled 2018-01-19: qty 60

## 2018-01-19 MED ORDER — LACTATED RINGERS IV SOLN: INTRAVENOUS | Status: DC

## 2018-01-19 NOTE — Anesthesia Postprocedure Evaluation (Signed)
Anesthesia Post Note  Patient: Colleen Lee  Procedure(s) Performed: COLONOSCOPY WITH PROPOFOL (N/A )  Patient location during evaluation: PACU Anesthesia Type: MAC Level of consciousness: awake and patient cooperative Pain management: pain level controlled Vital Signs Assessment: post-procedure vital signs reviewed and stable Respiratory status: spontaneous breathing, nonlabored ventilation and respiratory function stable Cardiovascular status: blood pressure returned to baseline Postop Assessment: no apparent nausea or vomiting Anesthetic complications: no     Last Vitals:  Vitals:   01/19/18 0759  BP: 126/76  Pulse: 66  Resp: 20  Temp: 36.9 C  SpO2: 96%    Last Pain:  Vitals:   01/19/18 0759  PainSc: 0-No pain                 Carlita Whitcomb J

## 2018-01-19 NOTE — Op Note (Signed)
[facility] Patient Name: Colleen Lee Procedure Date: 01/19/2018 9:29 AM MRN: [id number] Date of Birth: 1956/05/31 Attending MD: Norvel Richards , MD CSN: [id number] Age: 62 Admit Type: Outpatient Procedure:                Colonoscopy Indications:              Screening in patient at increased risk: Family                            history of 1st-degree relative with colorectal                            cancer Providers:                Norvel Richards, MD, Hinton Rao, RN, Nelma Rothman, Technician Referring MD:              Medicines:                Propofol per Anesthesia Complications:            No immediate complications. Estimated Blood Loss:     Estimated blood loss was minimal. Procedure:                Pre-Anesthesia Assessment:                           - Prior to the procedure, a History and Physical                            was performed, and patient medications and                            allergies were reviewed. The patient's tolerance of                            previous anesthesia was also reviewed. The risks                            and benefits of the procedure and the sedation                            options and risks were discussed with the patient.                            All questions were answered, and informed consent                            was obtained. Prior Anticoagulants: The patient has                            taken no previous anticoagulant or antiplatelet                            agents. ASA Grade  Assessment: III - A patient with                            severe systemic disease. After reviewing the risks                            and benefits, the patient was deemed in                            satisfactory condition to undergo the procedure.                           After obtaining informed consent, the colonoscope                            was passed under direct vision.  Throughout the                            procedure, the patient's blood pressure, pulse, and                            oxygen saturations were monitored continuously. The                            EC-[id number] ([id number]) scope was introduced through                            the and advanced to the the cecum, identified by                            appendiceal orifice and ileocecal valve. The                            EC-[id number] ([id number]) scope was introduced through                            the and advanced to the. The colonoscopy was                            performed without difficulty. The patient tolerated                            the procedure well. The quality of the bowel                            preparation was adequate. The ileocecal valve,                            appendiceal orifice, and rectum were photographed.                            The colonoscopy was performed without difficulty.  The entire colon was well visualized. Scope In: 9:43:41 AM Scope Out: 10:07:42 AM Scope Withdrawal Time: 0 hours 8 minutes 18 seconds  Total Procedure Duration: 0 hours 24 minutes 1 second  Findings:      The perianal and digital rectal examinations were normal. Tortuous       colon. Changing of the patient's position required to reach the cecum.      A 6 mm polyp was found in the cecum. The polyp was semi-pedunculated.       The polyp was removed with a cold snare. Resection and retrieval were       complete. Estimated blood loss was minimal.      The exam was otherwise without abnormality on direct and retroflexion       views. Impression:               - One 6 mm polyp in the cecum, removed with a cold                            snare. Resected and retrieved. Tortuous colon.                           - The examination was otherwise normal on direct                            and retroflexion views. Moderate Sedation:      Moderate (conscious)  sedation was personally administered by an       anesthesia professional. The following parameters were monitored: oxygen       saturation, heart rate, blood pressure, respiratory rate, EKG, adequacy       of pulmonary ventilation, and response to care. Total physician       intraservice time was 30 minutes. Recommendation:           - Patient has a contact number available for                            emergencies. The signs and symptoms of potential                            delayed complications were discussed with the                            patient. Return to normal activities tomorrow.                            Written discharge instructions were provided to the                            patient.                           - Resume previous diet.                           - Continue present medications.                           - Await  pathology results.                           - Repeat colonoscopy date to be determined after                            pending pathology results are reviewed for                            surveillance based on pathology results.                           - Return to GI office (date not yet determined). Procedure Code(s):        --- Professional ---                           [phone], Colonoscopy, flexible; with removal of                            tumor(s), polyp(s), or other lesion(s) by snare                            technique Diagnosis Code(s):        --- Professional ---                           Z80.0, Family history of malignant neoplasm of                            digestive organs                           D12.0, Benign neoplasm of cecum CPT copyright 2017 [employer] Association. All rights reserved. The codes documented in this report are preliminary and upon coder review may  be revised to meet current compliance requirements. Cristopher Estimable. Rourk, MD Norvel Richards, MD 01/19/2018 10:13:54 AM This report has been signed  electronically. Number of Addenda: 0

## 2018-01-19 NOTE — Anesthesia Preprocedure Evaluation (Signed)
Anesthesia Evaluation  Patient identified by MRN, date of birth, ID band Patient awake    Reviewed: Allergy & Precautions, NPO status , [name] Chart, lab work & pertinent test results  History of Anesthesia Complications (+) PONV  Airway Mallampati: I  TM Distance: >3 FB Neck ROM: Full    Dental no notable dental hx. (+) Edentulous Upper, Edentulous Lower   Pulmonary neg pulmonary ROS, asthma , COPD,  COPD inhaler, Current Smoker,  >40 pack year smoking history  Now on symbicort  Down to one PPD was 2PPD   Pulmonary exam normal breath sounds clear to auscultation       Cardiovascular Exercise Tolerance: Good hypertension, Pt. on medications and Pt. on home beta blockers negative cardio ROS Normal cardiovascular examI Rhythm:Regular Rate:Normal     Neuro/Psych  Neuromuscular disease negative neurological ROS  negative psych ROS   GI/Hepatic negative GI ROS, Neg liver ROS, GERD  Medicated and Controlled,(+) Cirrhosis       , Hepatitis -, CDenies GERD Sx today  Per chart h/o pyloric stenosis- states hasnt vomited recently and tolerated prep well   Endo/Other  negative endocrine ROS  Renal/GU Renal diseasenegative Renal ROS  negative genitourinary   Musculoskeletal negative musculoskeletal ROS (+) Arthritis , Osteoarthritis,  Fibromyalgia -  Abdominal   Peds negative pediatric ROS (+)  Hematology negative hematology ROS (+)   Anesthesia Other Findings   Reproductive/Obstetrics negative OB ROS                             Anesthesia Physical Anesthesia Plan  ASA: III  Anesthesia Plan: General   Post-op Pain Management:    Induction: Intravenous  PONV Risk Score and Plan:   Airway Management Planned: Simple Face Mask  Additional Equipment:   Intra-op Plan:   Post-operative Plan:   Informed Consent: I have reviewed the patients History and Physical, chart, labs and  discussed the procedure including the risks, benefits and alternatives for the proposed anesthesia with the patient or authorized representative who has indicated his/her understanding and acceptance.   Dental advisory given  Plan Discussed with: CRNA  Anesthesia Plan Comments:         Anesthesia Quick Evaluation

## 2018-01-19 NOTE — Discharge Instructions (Signed)
°Colonoscopy °Discharge Instructions ° °Read the instructions outlined below and refer to this sheet in the next few weeks. These discharge instructions provide you with general information on caring for yourself after you leave the hospital. Your doctor may also give you specific instructions. While your treatment has been planned according to the most current medical practices available, unavoidable complications occasionally occur. If you have any problems or questions after discharge, call Dr. Rourk at [phone]. °ACTIVITY °· You may resume your regular activity, but move at a slower pace for the next 24 hours.  °· Take frequent rest periods for the next 24 hours.  °· Walking will help get rid of the air and reduce the bloated feeling in your belly (abdomen).  °· No driving for 24 hours (because of the medicine (anesthesia) used during the test).   °· Do not sign any important legal documents or operate any machinery for 24 hours (because of the anesthesia used during the test).  °NUTRITION °· Drink plenty of fluids.  °· You may resume your normal diet as instructed by your doctor.  °· Begin with a light meal and progress to your normal diet. Heavy or fried foods are harder to digest and may make you feel sick to your stomach (nauseated).  °· Avoid alcoholic beverages for 24 hours or as instructed.  °MEDICATIONS °· You may resume your normal medications unless your doctor tells you otherwise.  °WHAT YOU CAN EXPECT TODAY °· Some feelings of bloating in the abdomen.  °· Passage of more gas than usual.  °· Spotting of blood in your stool or on the toilet paper.  °IF YOU HAD POLYPS REMOVED DURING THE COLONOSCOPY: °· No aspirin products for 7 days or as instructed.  °· No alcohol for 7 days or as instructed.  °· Eat a soft diet for the next 24 hours.  °FINDING OUT THE RESULTS OF YOUR TEST °Not all test results are available during your visit. If your test results are not back during the visit, make an appointment  with your caregiver to find out the results. Do not assume everything is normal if you have not heard from your caregiver or the medical facility. It is important for you to follow up on all of your test results.  °SEEK IMMEDIATE MEDICAL ATTENTION IF: °· You have more than a spotting of blood in your stool.  °· Your belly is swollen (abdominal distention).  °· You are nauseated or vomiting.  °· You have a temperature over 101.  °· You have abdominal pain or discomfort that is severe or gets worse throughout the day.  ° ° °Colon polyp information provided ° °Further recommendations to follow pending review of pathology report ° ° ° ° °Colon Polyps °Polyps are tissue growths inside the body. Polyps can grow in many places, including the large intestine (colon). A polyp may be a round bump or a mushroom-shaped growth. You could have one polyp or several. °Most colon polyps are noncancerous (benign). However, some colon polyps can become cancerous over time. °What are the causes? °The exact cause of colon polyps is not known. °What increases the risk? °This condition is more likely to develop in people who: °· Have a family history of colon cancer or colon polyps. °· Are older than 50 or older than 45 if they are African American. °· Have inflammatory bowel disease, such as ulcerative colitis or Crohn disease. °· Are overweight. °· Smoke cigarettes. °· Do not get enough exercise. °· Drink too   much alcohol. °· Eat a diet that is: °? High in fat and red meat. °? Low in fiber. °· Had childhood cancer that was treated with abdominal radiation. ° °What are the signs or symptoms? °Most polyps do not cause symptoms. If you have symptoms, they may include: °· Blood coming from your rectum when having a bowel movement. °· Blood in your stool. The stool may look dark red or black. °· A change in bowel habits, such as constipation or diarrhea. ° °How is this diagnosed? °This condition is diagnosed with a colonoscopy. This is a  procedure that uses a lighted, flexible scope to look at the inside of your colon. °How is this treated? °Treatment for this condition involves removing any polyps that are found. Those polyps will then be tested for cancer. If cancer is found, your health care provider will talk to you about options for colon cancer treatment. °Follow these instructions at home: °Diet °· Eat plenty of fiber, such as fruits, vegetables, and whole grains. °· Eat foods that are high in calcium and vitamin D, such as milk, cheese, yogurt, eggs, liver, fish, and broccoli. °· Limit foods high in fat, red meats, and processed meats, such as hot dogs, sausage, bacon, and lunch meats. °· Maintain a healthy weight, or lose weight if recommended by your health care provider. °General instructions °· Do not smoke cigarettes. °· Do not drink alcohol excessively. °· Keep all follow-up visits as told by your health care provider. This is important. This includes keeping regularly scheduled colonoscopies. Talk to your health care provider about when you need a colonoscopy. °· Exercise every day or as told by your health care provider. °Contact a health care provider if: °· You have new or worsening bleeding during a bowel movement. °· You have new or increased blood in your stool. °· You have a change in bowel habits. °· You unexpectedly lose weight. °This information is not intended to replace advice given to you by your health care provider. Make sure you discuss any questions you have with your health care provider. °Document Released: 04/14/2004 Document Revised: 12/25/2015 Document Reviewed: 06/09/2015 °Elsevier Interactive Patient Education © 2018 [employer] ° ° ° ° °Monitored Anesthesia Care, Care After °These instructions provide you with information about caring for yourself after your procedure. Your health care provider may also give you more specific instructions. Your treatment has been planned according to current medical  practices, but problems sometimes occur. Call your health care provider if you have any problems or questions after your procedure. °What can I expect after the procedure? °After your procedure, it is common to: °· Feel sleepy for several hours. °· Feel clumsy and have poor balance for several hours. °· Feel forgetful about what happened after the procedure. °· Have poor judgment for several hours. °· Feel nauseous or vomit. °· Have a sore throat if you had a breathing tube during the procedure. ° °Follow these instructions at home: °For at least 24 hours after the procedure: ° °· Do not: °? Participate in activities in which you could fall or become injured. °? Drive. °? Use heavy machinery. °? Drink alcohol. °? Take sleeping pills or medicines that cause drowsiness. °? Make important decisions or sign legal documents. °? Take care of children on your own. °· Rest. °Eating and drinking °· Follow the diet that is recommended by your health care provider. °· If you vomit, drink water, juice, or soup when you can drink without vomiting. °· Make sure   you have little or no nausea before eating solid foods. °General instructions °· Have a responsible adult stay with you until you are awake and alert. °· Take over-the-counter and prescription medicines only as told by your health care provider. °· If you smoke, do not smoke without supervision. °· Keep all follow-up visits as told by your health care provider. This is important. °Contact a health care provider if: °· You keep feeling nauseous or you keep vomiting. °· You feel light-headed. °· You develop a rash. °· You have a fever. °Get help right away if: °· You have trouble breathing. °This information is not intended to replace advice given to you by your health care provider. Make sure you discuss any questions you have with your health care provider. °Document Released: 11/09/2015 Document Revised: 03/10/2016 Document Reviewed: 11/09/2015 °Elsevier Interactive  Patient Education © 2018 [employer] ° °

## 2018-01-19 NOTE — H&P (Signed)
$'@LOGO'Z$ @   Primary Care Physician:  Rosita Fire, MD Primary Gastroenterologist:  Dr. Gala Romney  Pre-Procedure History & Physical: HPI:  Colleen Lee is a 62 y.o. female here for a high-risk screening colonoscopy. Positive family history of colon cancer. Last colonoscopy 2014.  Past Medical History:  Diagnosis Date  . Asthma   . Chronic abdominal pain   . Chronic back pain   . Chronic constipation   . Cirrhosis ([place])    Metavir score F4 on elastography 2015  . Cyclical vomiting   . Fibroids   . Fibromyalgia   . GERD (gastroesophageal reflux disease)   . H. pylori infection 2014   treated with pylera, had to be treated again as it was not eradicated. Urea breath test then negative after subsequent treatment.   . [name]    HCV RNA positive 09/2012  . Hypertension   . Marijuana use   . Nausea and vomiting    chronic, recurrent  . PONV (postoperative nausea and vomiting)    pt thinks maybe once she had N&V  . Sciatica of left side     Past Surgical History:  Procedure Laterality Date  . BALLOON DILATION  12/20/2017   Procedure: BALLOON DILATION;  Surgeon: Danie Binder, MD;  Location: [facility];  Service: Endoscopy;;  pyloric dilation  . BIOPSY  12/20/2017   Procedure: BIOPSY;  Surgeon: Danie Binder, MD;  Location: [facility];  Service: Endoscopy;;  duodenal and gastric biopsy  . COLONOSCOPY  01/18/2008   RKY:HCWCBJ rectum.  Long redundant colon, a diminutive sigmoid polyp status post cold biopsy removed. Hyperplastic polyp. Repeat colonoscopy June 2014 due to family history of colon cancer  . COLONOSCOPY WITH ESOPHAGOGASTRODUODENOSCOPY (EGD) N/A 11/02/2012   SEG:BTDVVOHY gastric mucosa of doubtful, +H.pylori. Incomplete colonoscopy due to patient unable to tolerate exam, proximal colon seen. Patient refused ACBE.  Marland Kitchen COLONOSCOPY WITH PROPOFOL N/A 03/08/2013   Dr. Gala Romney: colonic polyp-removed as scribed above. Internal Hemorrhoids. Pathology did not reveal any  colonic tissue, only mucus. SURVEILLANCE DUE Aug 2019  . ESOPHAGEAL DILATION  12/20/2017   Procedure: ESOPHAGEAL DILATION;  Surgeon: Danie Binder, MD;  Location: [facility];  Service: Endoscopy;;  . ESOPHAGOGASTRODUODENOSCOPY  01/18/2008   RMR: Normal esophagus, normal  stomach  . ESOPHAGOGASTRODUODENOSCOPY (EGD) WITH PROPOFOL N/A 02/09/2016   Normal esophagus, small hiatal hernia, portal hypertensive gastropathy, normal second portion of duodenum. Due in July 2019  . ESOPHAGOGASTRODUODENOSCOPY (EGD) WITH PROPOFOL N/A 12/20/2017   Dr. Oneida Alar: benign appearing esophageal stenosis s/p dilation, mild pyloric stenosis s/p biopsy and dilation, mild duodenitis.   Marland Kitchen HYSTEROSCOPY  05/11/2016   Procedure: HYSTEROSCOPY;  Surgeon: Jonnie Kind, MD;  Location: [facility];  Service: Gynecology;;  . KNEE SURGERY     left knee  . MULTIPLE EXTRACTIONS WITH ALVEOLOPLASTY N/A 10/15/2013   Procedure: MULTIPLE EXTRACTION WITH ALVEOLOPLASTY;  Surgeon: Gae Bon, DDS;  Location: [place];  Service: Oral Surgery;  Laterality: N/A;  . POLYPECTOMY N/A 03/08/2013   Procedure: POLYPECTOMY;  Surgeon: Daneil Dolin, MD;  Location: [facility];  Service: Endoscopy;  Laterality: N/A;  . POLYPECTOMY N/A 05/11/2016   Procedure: REMOVAL OF ENDOMETRIAL POLYP;  Surgeon: Jonnie Kind, MD;  Location: [facility];  Service: Gynecology;  Laterality: N/A;  . TOTAL KNEE ARTHROPLASTY Left 02/05/2015   Procedure: LEFT TOTAL KNEE ARTHROPLASTY;  Surgeon: Carole Civil, MD;  Location: [facility];  Service: Orthopedics;  Laterality: Left;    Prior to Admission  medications   Medication Sig Start Date End Date Taking? Authorizing Provider  amLODipine (NORVASC) 10 MG tablet Take 1 tablet (10 mg total) by mouth daily. 12/23/17  Yes Kathie Dike, MD  budesonide-formoterol (SYMBICORT) 160-4.5 MCG/ACT inhaler Inhale 2 puffs into the lungs 2 (two) times daily.   Yes [provider]  Cyanocobalamin (VITAMIN B 12 PO) Take 1 tablet by mouth  daily.   Yes [provider]  DULoxetine (CYMBALTA) 30 MG capsule Take 1 capsule by mouth 2 (two) times daily. 11/02/17  Yes [provider]  ergocalciferol (VITAMIN D2) 50000 units capsule Take 50,000 Units by mouth every 30 (thirty) days.   Yes [provider]  HYDROcodone-acetaminophen (NORCO/VICODIN) 5-325 MG tablet Take 1 tablet by mouth as needed for moderate pain.  11/02/17  Yes [provider]  megestrol (MEGACE) 40 MG tablet Take 40 mg by mouth daily.   Yes [provider]  metoprolol tartrate (LOPRESSOR) 25 MG tablet Take 1 tablet (25 mg total) by mouth 2 (two) times daily. 12/22/17  Yes Kathie Dike, MD  pantoprazole (PROTONIX) 40 MG tablet Take 1 tablet (40 mg total) by mouth 2 (two) times daily before a meal. 12/22/17  Yes Memon, Jolaine Artist, MD  Plecanatide (TRULANCE) 3 MG TABS Take 1 tablet by mouth daily. 05/09/17  Yes Mahala Menghini, PA-C  GAVILYTE-G 236 g solution as needed.  11/16/17   [provider]  hydrOXYzine (ATARAX/VISTARIL) 10 MG tablet  11/30/17   [provider]  ondansetron (ZOFRAN ODT) 4 MG disintegrating tablet Take 1 tablet (4 mg total) by mouth 3 (three) times daily. '4mg'$  ODT q4 hours prn nausea/vomit 12/22/17   Kathie Dike, MD    Allergies as of 12/29/2017 - Review Complete 12/29/2017  Allergen Reaction Noted  . Penicillins Rash     Family History  Problem Relation Age of Onset  . Colon cancer Brother 3          . Multiple myeloma Brother   . Liver cancer Sister   . Prostate cancer Brother   . Pancreatic cancer Brother   . Cancer Mother        breast  . Asthma Mother     Social History   Socioeconomic History  . Marital status: Single    Spouse name: Not on file  . Number of children: 0  . Years of education: Not on file  . Highest education level: Not on file  Occupational History  . Occupation: umemployed   Social Needs  . Financial resource strain: Not on file  . Food insecurity:     Worry: Not on file    Inability: Not on file  . Transportation needs:    Medical: Not on file    Non-medical: Not on file  Tobacco Use  . Smoking status: Current Every Day Smoker    Packs/day: 0.50    Years: 40.00    Pack years: 20.00    Types: Cigarettes  . Smokeless tobacco: Never Used  . Tobacco comment: Smokes one pack of cigarettes daily  Substance and Sexual Activity  . Alcohol use: No    Alcohol/week: 0.0 oz  . Drug use: No    Types: Marijuana    Comment: history of marijuana in past  . Sexual activity: Never    Birth control/protection: None  Lifestyle  . Physical activity:    Days per week: Not on file    Minutes per session: Not on file  . Stress: Not on file  Relationships  .  Social connections:    Talks on phone: Not on file    Gets together: Not on file    Attends religious service: Not on file    Active member of club or organization: Not on file    Attends meetings of clubs or organizations: Not on file    Relationship status: Not on file  . Intimate partner violence:    Fear of current or ex partner: Not on file    Emotionally abused: Not on file    Physically abused: Not on file    Forced sexual activity: Not on file  Other Topics Concern  . Not on file  Social History Narrative  . Not on file    Review of Systems: See HPI, otherwise negative ROS  Physical Exam: BP 126/76 (BP Location: Right Arm)   Pulse 66   Temp 98.4 F (36.9 C)   Resp 20   SpO2 96%  General:   Alert,  Well-developed, well-nourished, pleasant and cooperative in NAD Neck:  Supple; no masses or thyromegaly. No significant cervical adenopathy. Lungs:  Clear throughout to auscultation.   No wheezes, crackles, or rhonchi. No acute distress. Heart:  Regular rate and rhythm; no murmurs, clicks, rubs,  or gallops. Abdomen: Non-distended, normal bowel sounds.  Soft and nontender without appreciable mass or hepatosplenomegaly.  Pulses:  Normal pulses noted. Extremities:   Without clubbing or edema.  Impression:   Pleasant 62 year old lady with a positive family history of colon cancer. Here for surveillance colonoscopy.   Recommendations:   I have offered the patient  a high-risk screening colonoscopy today per plan.  The risks, benefits, limitations, alternatives and imponderables have been reviewed with the patient. Questions have been answered. All parties are agreeable. for planned.            Notice: This dictation was prepared with Dragon dictation along with smaller phrase technology. Any transcriptional errors that result from this process are unintentional and may not be corrected upon review.

## 2018-01-19 NOTE — Transfer of Care (Signed)
Immediate Anesthesia Transfer of Care Note  Patient: Colleen Lee  Procedure(s) Performed: COLONOSCOPY WITH PROPOFOL (N/A )  Patient Location: PACU  Anesthesia Type:MAC  Level of Consciousness: drowsy and patient cooperative  Airway & Oxygen Therapy: Patient Spontanous Breathing  Post-op Assessment: Report given to RN, Post -op Vital signs reviewed and stable and Patient moving all extremities  Post vital signs: Reviewed and stable  Last Vitals:  Vitals Value Taken Time  BP    Temp    Pulse    Resp    SpO2      Last Pain:  Vitals:   01/19/18 0759  PainSc: 0-No pain         Complications: No apparent anesthesia complications

## 2018-01-22 ENCOUNTER — Encounter: Payer: Self-pay | Admitting: Internal Medicine

## 2018-01-23 DIAGNOSIS — I10 Essential (primary) hypertension: Secondary | ICD-10-CM | POA: Diagnosis not present

## 2018-01-23 DIAGNOSIS — Z1331 Encounter for screening for depression: Secondary | ICD-10-CM | POA: Diagnosis not present

## 2018-01-23 DIAGNOSIS — J449 Chronic obstructive pulmonary disease, unspecified: Secondary | ICD-10-CM | POA: Diagnosis not present

## 2018-01-23 DIAGNOSIS — Z1389 Encounter for screening for other disorder: Secondary | ICD-10-CM | POA: Diagnosis not present

## 2018-01-23 DIAGNOSIS — M549 Dorsalgia, unspecified: Secondary | ICD-10-CM | POA: Diagnosis not present

## 2018-01-23 DIAGNOSIS — B182 Chronic viral hepatitis C: Secondary | ICD-10-CM | POA: Diagnosis not present

## 2018-01-24 ENCOUNTER — Encounter (HOSPITAL_COMMUNITY): Payer: Self-pay | Admitting: Internal Medicine

## 2018-01-25 DIAGNOSIS — Z79891 Long term (current) use of opiate analgesic: Secondary | ICD-10-CM | POA: Diagnosis not present

## 2018-01-25 DIAGNOSIS — M461 Sacroiliitis, not elsewhere classified: Secondary | ICD-10-CM | POA: Diagnosis not present

## 2018-01-25 DIAGNOSIS — M13862 Other specified arthritis, left knee: Secondary | ICD-10-CM | POA: Diagnosis not present

## 2018-01-25 DIAGNOSIS — G894 Chronic pain syndrome: Secondary | ICD-10-CM | POA: Diagnosis not present

## 2018-01-28 ENCOUNTER — Encounter (HOSPITAL_COMMUNITY): Payer: Self-pay | Admitting: Emergency Medicine

## 2018-01-28 ENCOUNTER — Other Ambulatory Visit: Payer: Self-pay

## 2018-01-28 ENCOUNTER — Inpatient Hospital Stay (HOSPITAL_COMMUNITY)
Admission: EM | Admit: 2018-01-28 | Discharge: 2018-01-30 | DRG: 103 | Disposition: A | Discharge status: Home / Self Care | Payer: Medicare HMO | Attending: Internal Medicine | Admitting: Internal Medicine

## 2018-01-28 DIAGNOSIS — G8929 Other chronic pain: Secondary | ICD-10-CM | POA: Diagnosis not present

## 2018-01-28 DIAGNOSIS — R109 Unspecified abdominal pain: Secondary | ICD-10-CM

## 2018-01-28 DIAGNOSIS — K7030 Alcoholic cirrhosis of liver without ascites: Secondary | ICD-10-CM | POA: Diagnosis present

## 2018-01-28 DIAGNOSIS — R112 Nausea with vomiting, unspecified: Secondary | ICD-10-CM | POA: Diagnosis present

## 2018-01-28 DIAGNOSIS — E860 Dehydration: Secondary | ICD-10-CM

## 2018-01-28 DIAGNOSIS — G43A1 Cyclical vomiting, intractable: Secondary | ICD-10-CM | POA: Diagnosis not present

## 2018-01-28 DIAGNOSIS — Z8619 Personal history of other infectious and parasitic diseases: Secondary | ICD-10-CM

## 2018-01-28 DIAGNOSIS — Z7951 Long term (current) use of inhaled steroids: Secondary | ICD-10-CM

## 2018-01-28 DIAGNOSIS — J45909 Unspecified asthma, uncomplicated: Secondary | ICD-10-CM | POA: Diagnosis not present

## 2018-01-28 DIAGNOSIS — Z79899 Other long term (current) drug therapy: Secondary | ICD-10-CM

## 2018-01-28 DIAGNOSIS — I10 Essential (primary) hypertension: Secondary | ICD-10-CM | POA: Diagnosis not present

## 2018-01-28 DIAGNOSIS — K219 Gastro-esophageal reflux disease without esophagitis: Secondary | ICD-10-CM | POA: Diagnosis present

## 2018-01-28 DIAGNOSIS — G894 Chronic pain syndrome: Secondary | ICD-10-CM | POA: Diagnosis present

## 2018-01-28 DIAGNOSIS — F17210 Nicotine dependence, cigarettes, uncomplicated: Secondary | ICD-10-CM | POA: Diagnosis present

## 2018-01-28 DIAGNOSIS — G43A0 Cyclical vomiting, not intractable: Secondary | ICD-10-CM | POA: Diagnosis not present

## 2018-01-28 DIAGNOSIS — K7460 Unspecified cirrhosis of liver: Secondary | ICD-10-CM | POA: Diagnosis not present

## 2018-01-28 DIAGNOSIS — E876 Hypokalemia: Secondary | ICD-10-CM | POA: Diagnosis not present

## 2018-01-28 DIAGNOSIS — M797 Fibromyalgia: Secondary | ICD-10-CM | POA: Diagnosis present

## 2018-01-28 DIAGNOSIS — Z96652 Presence of left artificial knee joint: Secondary | ICD-10-CM | POA: Diagnosis present

## 2018-01-28 DIAGNOSIS — Z880 Allergy status to penicillin: Secondary | ICD-10-CM

## 2018-01-28 DIAGNOSIS — R1115 Cyclical vomiting syndrome unrelated to migraine: Secondary | ICD-10-CM | POA: Diagnosis present

## 2018-01-28 DIAGNOSIS — B182 Chronic viral hepatitis C: Secondary | ICD-10-CM | POA: Diagnosis not present

## 2018-01-28 LAB — URINALYSIS, ROUTINE W REFLEX MICROSCOPIC
Bacteria, UA: NONE SEEN
Bilirubin Urine: NEGATIVE
Glucose, UA: 50 mg/dL — AB
Ketones, ur: 20 mg/dL — AB
Leukocytes, UA: NEGATIVE
Nitrite: NEGATIVE
Protein, ur: 100 mg/dL — AB
Specific Gravity, Urine: 1.015 (ref 1.005–1.030)
pH: 5 (ref 5.0–8.0)

## 2018-01-28 LAB — COMPREHENSIVE METABOLIC PANEL
ALT: 17 U/L (ref 0–44)
AST: 19 U/L (ref 15–41)
Albumin: 4.7 g/dL (ref 3.5–5.0)
Alkaline Phosphatase: 65 U/L (ref 38–126)
Anion gap: 11 (ref 5–15)
BUN: 7 mg/dL — ABNORMAL LOW (ref 8–23)
CO2: 18 mmol/L — ABNORMAL LOW (ref 22–32)
Calcium: 10.7 mg/dL — ABNORMAL HIGH (ref 8.9–10.3)
Chloride: 104 mmol/L (ref 98–111)
Creatinine, Ser: 0.68 mg/dL (ref 0.44–1.00)
GFR calc Af Amer: >60 mL/min (ref >60)
GFR calc non Af Amer: >60 mL/min (ref >60)
Glucose, Bld: 137 mg/dL — ABNORMAL HIGH (ref 70–99)
Potassium: 3.2 mmol/L — ABNORMAL LOW (ref 3.5–5.1)
Sodium: 133 mmol/L — ABNORMAL LOW (ref 135–145)
Total Bilirubin: 1.4 mg/dL — ABNORMAL HIGH (ref 0.3–1.2)
Total Protein: 9 g/dL — ABNORMAL HIGH (ref 6.5–8.1)

## 2018-01-28 LAB — CBC
HCT: 41.2 % (ref 36.0–46.0)
Hemoglobin: 14 g/dL (ref 12.0–15.0)
MCH: 32.2 pg (ref 26.0–34.0)
MCHC: 34 g/dL (ref 30.0–36.0)
MCV: 94.7 fL (ref 78.0–100.0)
Platelets: 230 10*3/uL (ref 150–400)
RBC: 4.35 MIL/uL (ref 3.87–5.11)
RDW: 14.7 % (ref 11.5–15.5)
WBC: 10 10*3/uL (ref 4.0–10.5)

## 2018-01-28 LAB — LIPASE, BLOOD: Lipase: 39 U/L (ref 11–51)

## 2018-01-28 MED ORDER — LOSARTAN POTASSIUM-HCTZ 100-12.5 MG PO TABS: 1.0000 | ORAL_TABLET | Freq: Every day | ORAL | Status: DC

## 2018-01-28 MED ORDER — ACETAMINOPHEN 650 MG RE SUPP: 650.0000 mg | Freq: Four times a day (QID) | RECTAL | Status: DC | PRN

## 2018-01-28 MED ORDER — ACETAMINOPHEN 325 MG PO TABS
650.0000 mg | ORAL_TABLET | Freq: Four times a day (QID) | ORAL | Status: DC | PRN
  Administered 2018-01-28 – 2018-01-30 (×2): 650 mg via ORAL
  Filled 2018-01-28 (×2): qty 2

## 2018-01-28 MED ORDER — LOSARTAN POTASSIUM 50 MG PO TABS
100.0000 mg | ORAL_TABLET | Freq: Every day | ORAL | Status: DC
  Administered 2018-01-29 – 2018-01-30 (×2): 100 mg via ORAL
  Filled 2018-01-28 (×2): qty 2

## 2018-01-28 MED ORDER — PROMETHAZINE HCL 25 MG/ML IJ SOLN: 25.0000 mg | Freq: Four times a day (QID) | INTRAMUSCULAR | Status: DC | PRN

## 2018-01-28 MED ORDER — HYDROCHLOROTHIAZIDE 12.5 MG PO CAPS
12.5000 mg | ORAL_CAPSULE | Freq: Every day | ORAL | Status: DC
  Filled 2018-01-28 (×2): qty 1

## 2018-01-28 MED ORDER — ONDANSETRON HCL 4 MG/2ML IJ SOLN
4.0000 mg | Freq: Four times a day (QID) | INTRAMUSCULAR | Status: DC | PRN
  Administered 2018-01-28 – 2018-01-29 (×3): 4 mg via INTRAVENOUS
  Filled 2018-01-28 (×3): qty 2

## 2018-01-28 MED ORDER — PROMETHAZINE HCL 25 MG/ML IJ SOLN
25.0000 mg | Freq: Once | INTRAMUSCULAR | Status: AC
  Administered 2018-01-28: 25 mg via INTRAVENOUS
  Filled 2018-01-28: qty 1

## 2018-01-28 MED ORDER — METOPROLOL TARTRATE 5 MG/5ML IV SOLN
INTRAVENOUS | Status: AC
  Administered 2018-01-28: 5 mg via INTRAVENOUS
  Filled 2018-01-28: qty 5

## 2018-01-28 MED ORDER — KETOROLAC TROMETHAMINE 30 MG/ML IJ SOLN
30.0000 mg | Freq: Four times a day (QID) | INTRAMUSCULAR | Status: DC | PRN
  Administered 2018-01-28 – 2018-01-29 (×2): 30 mg via INTRAVENOUS
  Filled 2018-01-28 (×2): qty 1

## 2018-01-28 MED ORDER — LACTATED RINGERS IV BOLUS
1000.0000 mL | Freq: Once | INTRAVENOUS | Status: AC
  Administered 2018-01-28: 1000 mL via INTRAVENOUS

## 2018-01-28 MED ORDER — ZOLPIDEM TARTRATE 5 MG PO TABS
5.0000 mg | ORAL_TABLET | Freq: Every evening | ORAL | Status: DC | PRN
  Administered 2018-01-29: 5 mg via ORAL
  Filled 2018-01-28: qty 1

## 2018-01-28 MED ORDER — POTASSIUM CHLORIDE IN NACL 40-0.9 MEQ/L-% IV SOLN
INTRAVENOUS | Status: DC
  Administered 2018-01-28: 125 mL/h via INTRAVENOUS
  Administered 2018-01-29: 100 mL/h via INTRAVENOUS
  Administered 2018-01-29: 125 mL/h via INTRAVENOUS
  Administered 2018-01-30: 100 mL/h via INTRAVENOUS
  Filled 2018-01-28 (×6): qty 1000

## 2018-01-28 MED ORDER — METOPROLOL TARTRATE 5 MG/5ML IV SOLN
5.0000 mg | Freq: Four times a day (QID) | INTRAVENOUS | Status: DC
  Administered 2018-01-28 – 2018-01-29 (×2): 5 mg via INTRAVENOUS
  Filled 2018-01-28: qty 5

## 2018-01-28 MED ORDER — ENOXAPARIN SODIUM 40 MG/0.4ML ~~LOC~~ SOLN
40.0000 mg | SUBCUTANEOUS | Status: DC
  Administered 2018-01-28 – 2018-01-29 (×2): 40 mg via SUBCUTANEOUS
  Filled 2018-01-28 (×2): qty 0.4

## 2018-01-28 MED ORDER — PANTOPRAZOLE SODIUM 40 MG PO TBEC
40.0000 mg | DELAYED_RELEASE_TABLET | Freq: Two times a day (BID) | ORAL | Status: DC
  Administered 2018-01-29 – 2018-01-30 (×3): 40 mg via ORAL
  Filled 2018-01-28 (×3): qty 1

## 2018-01-28 MED ORDER — HYDROMORPHONE HCL 1 MG/ML IJ SOLN
0.5000 mg | Freq: Once | INTRAMUSCULAR | Status: AC
  Administered 2018-01-28: 0.5 mg via INTRAVENOUS
  Filled 2018-01-28: qty 1

## 2018-01-28 MED ORDER — PROCHLORPERAZINE EDISYLATE 10 MG/2ML IJ SOLN
10.0000 mg | Freq: Once | INTRAMUSCULAR | Status: AC
  Administered 2018-01-28: 10 mg via INTRAVENOUS
  Filled 2018-01-28: qty 2

## 2018-01-28 MED ORDER — MOMETASONE FURO-FORMOTEROL FUM 200-5 MCG/ACT IN AERO
2.0000 | INHALATION_SPRAY | Freq: Two times a day (BID) | RESPIRATORY_TRACT | Status: DC
  Administered 2018-01-29 – 2018-01-30 (×3): 2 via RESPIRATORY_TRACT
  Filled 2018-01-28: qty 8.8

## 2018-01-28 NOTE — ED Provider Notes (Addendum)
[facility] EMERGENCY DEPARTMENT Provider Note   CSN: [id number] Arrival date & time: 01/28/18  1509     History   Chief Complaint Chief Complaint  Patient presents with  . Emesis    HPI Colleen Lee is a 62 y.o. female.  HPI Patient presents with nausea vomiting abdominal pain.  History of chronic abdominal pain and chronic nausea vomiting.  Unsure of cause but has been talk of cannabinoid hyperemesis or cyclic vomiting.  Around 1 month ago was admitted for the same and bumped her creatinine at that time.  Had been doing well up until yesterday then began to vomit again.  Pain in the upper abdomen.  States she needs something for the pain.  Nothing new in terms of why it would have come back.  No diarrhea.  Pain is sharp and goes to her back also. Past Medical History:  Diagnosis Date  . Asthma   . Chronic abdominal pain   . Chronic back pain   . Chronic constipation   . Cirrhosis ([place])    Metavir score F4 on elastography 2015  . Cyclical vomiting   . Fibroids   . Fibromyalgia   . GERD (gastroesophageal reflux disease)   . H. pylori infection 2014   treated with pylera, had to be treated again as it was not eradicated. Urea breath test then negative after subsequent treatment.   . [name]    HCV RNA positive 09/2012  . Hypertension   . Marijuana use   . Nausea and vomiting    chronic, recurrent  . PONV (postoperative nausea and vomiting)    pt thinks maybe once she had N&V  . Sciatica of left side     Patient Active Problem List   Diagnosis Date Noted  . Pyloric stenosis in adult   . Thrombocytopenia ([place]) 12/20/2017  . Leukocytosis   . Malnutrition of moderate degree 12/17/2017  . Acute renal failure (ARF) ([place]) 12/15/2017  . Chronic abdominal pain 12/15/2017  . Gastroenteritis 06/14/2016  . Nausea with vomiting 06/10/2016  . Uterine enlargement 06/09/2016  . Intractable nausea and vomiting 06/08/2016  . Acute infective gastroenteritis 06/08/2016  .  Diarrhea 06/08/2016  . Essential hypertension 06/08/2016  . GERD (gastroesophageal reflux disease) 06/08/2016  . Abdominal pain   . Endometrial polyp 04/15/2016  . PMB (postmenopausal bleeding) 02/27/2016  . Alcoholic cirrhosis of liver without ascites ([place])   . Asthma 01/21/2016  . Hepatic cirrhosis ([place]) 09/22/2015  . Arthritis of knee, degenerative 02/05/2015  . History of Helicobacter pylori infection 10/30/2014  . Chronic [name] with cirrhosis ([place]) 04/11/2014  . De Quervain's disease (radial styloid tenosynovitis) 12/25/2013  . Anorexia 11/21/2012  . FH: colon cancer 11/21/2012  . Early satiety 10/25/2012  . Bowel habit changes 10/25/2012  . Abdominal pain, epigastric 10/25/2012  . Abdominal bloating 10/25/2012  . Constipation 10/25/2012  . Abnormal weight loss 10/25/2012  . Chronic viral [name] ([place]) 10/25/2012  . Radicular pain of left lower extremity 09/28/2012  . Back pain 09/28/2012  . Sciatica 08/10/2011  . S/P arthroscopy of left knee 08/10/2011  . Tibial plateau fracture 08/10/2011  . Pain in joint, lower leg 02/12/2011  . Stiffness of joint, not elsewhere classified, lower leg 02/12/2011  . 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 Meniscus, medial, derangement 12/29/2010  . CLOSED FRACTURE OF UPPER END OF TIBIA 08/12/2010    Past Surgical History:  Procedure Laterality Date  . BALLOON DILATION  12/20/2017   Procedure: BALLOON DILATION;  Surgeon: Danie Binder, MD;  Location: [facility];  Service: Endoscopy;;  pyloric dilation  . BIOPSY  12/20/2017   Procedure: BIOPSY;  Surgeon: Danie Binder, MD;  Location: [facility];  Service: Endoscopy;;  duodenal and gastric biopsy  . COLONOSCOPY  01/18/2008   [id number] rectum.  Long redundant colon, a diminutive sigmoid polyp status post cold biopsy removed. Hyperplastic polyp. Repeat colonoscopy June 2014 due to family history of colon cancer  . COLONOSCOPY WITH ESOPHAGOGASTRODUODENOSCOPY (EGD) N/A  11/02/2012   DDU:KGURKYHC gastric mucosa of doubtful, +H.pylori. Incomplete colonoscopy due to patient unable to tolerate exam, proximal colon seen. Patient refused ACBE.  Marland Kitchen COLONOSCOPY WITH PROPOFOL N/A 03/08/2013   Dr. Gala Romney: colonic polyp-removed as scribed above. Internal Hemorrhoids. Pathology did not reveal any colonic tissue, only mucus. SURVEILLANCE DUE Aug 2019  . COLONOSCOPY WITH PROPOFOL N/A 01/19/2018   Procedure: COLONOSCOPY WITH PROPOFOL;  Surgeon: Daneil Dolin, MD;  Location: [facility];  Service: Endoscopy;  Laterality: N/A;  9:45am  . ESOPHAGEAL DILATION  12/20/2017   Procedure: ESOPHAGEAL DILATION;  Surgeon: Danie Binder, MD;  Location: [facility];  Service: Endoscopy;;  . ESOPHAGOGASTRODUODENOSCOPY  01/18/2008   RMR: Normal esophagus, normal  stomach  . ESOPHAGOGASTRODUODENOSCOPY (EGD) WITH PROPOFOL N/A 02/09/2016   Normal esophagus, small hiatal hernia, portal hypertensive gastropathy, normal second portion of duodenum. Due in July 2019  . ESOPHAGOGASTRODUODENOSCOPY (EGD) WITH PROPOFOL N/A 12/20/2017   Dr. Oneida Alar: benign appearing esophageal stenosis s/p dilation, mild pyloric stenosis s/p biopsy and dilation, mild duodenitis.   Marland Kitchen HYSTEROSCOPY  05/11/2016   Procedure: HYSTEROSCOPY;  Surgeon: Jonnie Kind, MD;  Location: [facility];  Service: Gynecology;;  . KNEE SURGERY     left knee  . MULTIPLE EXTRACTIONS WITH ALVEOLOPLASTY N/A 10/15/2013   Procedure: MULTIPLE EXTRACTION WITH ALVEOLOPLASTY;  Surgeon: Gae Bon, DDS;  Location: [place];  Service: Oral Surgery;  Laterality: N/A;  . POLYPECTOMY N/A 03/08/2013   Procedure: POLYPECTOMY;  Surgeon: Daneil Dolin, MD;  Location: [facility];  Service: Endoscopy;  Laterality: N/A;  . POLYPECTOMY N/A 05/11/2016   Procedure: REMOVAL OF ENDOMETRIAL POLYP;  Surgeon: Jonnie Kind, MD;  Location: [facility];  Service: Gynecology;  Laterality: N/A;  . TOTAL KNEE ARTHROPLASTY Left 02/05/2015   Procedure: LEFT TOTAL KNEE ARTHROPLASTY;   Surgeon: Carole Civil, MD;  Location: [facility];  Service: Orthopedics;  Laterality: Left;     OB History    Gravida      Para      Term      Preterm      AB      Living  1     SAB      TAB      Ectopic      Multiple      Live Births               Home Medications    Prior to Admission medications   Medication Sig Start Date End Date Taking? Authorizing Provider  amLODipine (NORVASC) 10 MG tablet Take 1 tablet (10 mg total) by mouth daily. 12/23/17  Yes Kathie Dike, MD  budesonide-formoterol (SYMBICORT) 160-4.5 MCG/ACT inhaler Inhale 2 puffs into the lungs 2 (two) times daily.   Yes [provider]  Cyanocobalamin (VITAMIN B 12 PO) Take 1 tablet by mouth daily.   Yes [provider]  DULoxetine (CYMBALTA) 30 MG capsule Take 1 capsule by mouth 2 (two) times daily. 11/02/17  Yes  [provider]  HYDROcodone-acetaminophen (NORCO/VICODIN) 5-325 MG tablet Take 1 tablet by mouth as needed for moderate pain.  11/02/17  Yes [provider]  hydrOXYzine (ATARAX/VISTARIL) 10 MG tablet  11/30/17  Yes [provider]  losartan-hydrochlorothiazide (HYZAAR) 100-12.5 MG tablet Take 1 tablet by mouth daily. 01/09/18  Yes [provider]  megestrol (MEGACE) 40 MG tablet Take 40 mg by mouth daily.   Yes [provider]  metoprolol tartrate (LOPRESSOR) 25 MG tablet Take 1 tablet (25 mg total) by mouth 2 (two) times daily. 12/22/17  Yes Kathie Dike, MD  ondansetron (ZOFRAN ODT) 4 MG disintegrating tablet Take 1 tablet (4 mg total) by mouth 3 (three) times daily. '4mg'$  ODT q4 hours prn nausea/vomit 12/22/17  Yes Memon, Jolaine Artist, MD  pantoprazole (PROTONIX) 40 MG tablet Take 1 tablet (40 mg total) by mouth 2 (two) times daily before a meal. 12/22/17  Yes Memon, Jolaine Artist, MD  Plecanatide (TRULANCE) 3 MG TABS Take 1 tablet by mouth daily. 05/09/17  Yes Mahala Menghini, PA-C  GAVILYTE-G 236 g solution as needed.  11/16/17   [provider]    Family History Family History  Problem Relation Age of Onset  . Colon cancer Brother 65          . Multiple myeloma Brother   . Liver cancer Sister   . Prostate cancer Brother   . Pancreatic cancer Brother   . Cancer Mother        breast  . Asthma Mother     Social History Social History   Tobacco Use  . Smoking status: Current Every Day Smoker    Packs/day: 0.50    Years: 40.00    Pack years: 20.00    Types: Cigarettes  . Smokeless tobacco: Never Used  . Tobacco comment: Smokes one pack of cigarettes daily  Substance Use Topics  . Alcohol use: No    Alcohol/week: 0.0 oz  . Drug use: No    Types: Marijuana    Comment: history of marijuana in past     Allergies   Penicillins   Review of Systems Review of Systems  Constitutional: Positive for appetite change.  HENT: Negative for congestion.   Respiratory: Negative for shortness of breath.   Cardiovascular: Negative for chest pain.  Gastrointestinal: Positive for abdominal pain, nausea and vomiting. Negative for diarrhea.  Genitourinary: Negative for flank pain.  Musculoskeletal: Negative for back pain.  Neurological: Negative for weakness.  Hematological: Negative for adenopathy.  Psychiatric/Behavioral: Negative for confusion.     Physical Exam Updated Vital Signs BP (!) 183/86   Pulse 84   Temp 97.6 F (36.4 C) (Temporal)   Resp 18   Ht '5\' 6"'$  (1.676 m)   Wt 80.7 kg (178 lb)   SpO2 100%   BMI 28.73 kg/m   Physical Exam  Constitutional: She appears well-developed.  HENT:  Head: Normocephalic.  Eyes: Pupils are equal, round, and reactive to light.  Neck: Neck supple.  Cardiovascular: Normal rate.  Pulmonary/Chest: She has no wheezes. She exhibits no tenderness.  Abdominal:  Upper abdominal tenderness without rebound or guarding.  Musculoskeletal: She exhibits no edema.  Neurological: She is alert.  Skin: Skin is warm. Capillary refill takes less than 2 seconds.    Psychiatric: She has a normal mood and affect.     ED Treatments / Results  Labs (all labs ordered are listed, but only abnormal results are displayed) Labs Reviewed  COMPREHENSIVE METABOLIC PANEL - Abnormal; Notable for  the following components:      Result Value   Sodium 133 (*)    Potassium 3.2 (*)    CO2 18 (*)    Glucose, Bld 137 (*)    BUN 7 (*)    Calcium 10.7 (*)    Total Protein 9.0 (*)    Total Bilirubin 1.4 (*)    All other components within normal limits  LIPASE, BLOOD  CBC  URINALYSIS, ROUTINE W REFLEX MICROSCOPIC    EKG None  Radiology No results found.  Procedures Procedures (including critical care time)  Medications Ordered in ED Medications  lactated ringers bolus 1,000 mL (1,000 mLs Intravenous New Bag/Given 01/28/18 1827)  promethazine (PHENERGAN) injection 25 mg (has no administration in time range)  prochlorperazine (COMPAZINE) injection 10 mg (10 mg Intravenous Given 01/28/18 1624)  HYDROmorphone (DILAUDID) injection 0.5 mg (0.5 mg Intravenous Given 01/28/18 1624)     Initial Impression / Assessment and Plan / ED Course  I have reviewed the triage vital signs and the nursing notes.  Pertinent labs & imaging results that were available during my care of the patient were reviewed by me and considered in my medical decision making (see chart for details).     Patient with acute on chronic nausea vomiting abdominal pain.  Admitted for similar symptoms a month ago.  Symptoms began yesterday.  Has some mild electrolyte abnormalities.  Continued pain.  Continued to vomit here after Compazine.  Will admit to hospitalist.  Patient continues to request pain medicines.  Has been given more antiemetics. Final Clinical Impressions(s) / ED Diagnoses   Final diagnoses:  Non-intractable cyclical vomiting with nausea  Chronic abdominal pain    ED Discharge Orders    None       Davonna Belling, MD 01/28/18 Doran Heater    Davonna Belling,  MD 01/28/18 1924

## 2018-01-28 NOTE — ED Notes (Signed)
Pt instructed to get an urine sample 

## 2018-01-28 NOTE — ED Notes (Signed)
Unable to draw blood from IV site.

## 2018-01-28 NOTE — ED Triage Notes (Signed)
Pt reports N/V/D/ started yesterday. States she was recently admitted for the same. Unable to keep PO fluids down.

## 2018-01-28 NOTE — H&P (Signed)
History and Physical  Colleen Lee NWG:[id number] DOB: 12/04/1955 DOA: 01/28/2018  Referring physician: Dr Rubin Payor, ED physician PCP: Avon Gully, MD  Outpatient Specialists: Dr Jena Gauss  Patient Coming From: home  Chief Complaint: vomiting, abdominal pain  HPI: Colleen Lee is a 62 y.o. female with a history of asthma, chronic pain, history of H. 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  Patient came to the hospital for abdominal pain and vomiting that started around 4:00 this morning.  Emesis described as stomach contents or saliva.  Pain is in the left upper quadrant and is nonradiating and is quite severe.  No palliating or provoking factors.   She was hospitalized approximately a month ago for similar complaints, but had acute renal injury at that time.  It also appears that she comes to the emergency department for vomiting fairly frequently.  Emergency Department Course: Patient received half a milligram of Dilaudid.  She has received Phenergan and Compazine.  Creatinine normal.  White count normal.  Review of Systems:   Pt denies any fevers, chills, diarrhea, constipation, shortness of breath, dyspnea on exertion, orthopnea, cough, wheezing, palpitations, headache, vision changes, lightheadedness, dizziness, melena, rectal bleeding.  Review of systems are otherwise negative  Past Medical History:  Diagnosis Date  . Asthma   . Chronic abdominal pain   . Chronic back pain   . Chronic constipation   . Cirrhosis (HCC)    Metavir score F4 on elastography 2015  . Cyclical vomiting   . Fibroids   . Fibromyalgia   . GERD (gastroesophageal reflux disease)   . H. pylori infection 2014   treated with pylera, had to be treated again as it was not eradicated. Urea breath test then negative after subsequent treatment.   . [name]    HCV RNA positive 09/2012  .  Hypertension   . Marijuana use   . Nausea and vomiting    chronic, recurrent  . PONV (postoperative nausea and vomiting)    pt thinks maybe once she had N&V  . Sciatica of left side    Past Surgical History:  Procedure Laterality Date  . BALLOON DILATION  12/20/2017   Procedure: BALLOON DILATION;  Surgeon: West Bali, MD;  Location: [facility];  Service: Endoscopy;;  pyloric dilation  . BIOPSY  12/20/2017   Procedure: BIOPSY;  Surgeon: West Bali, MD;  Location: [facility];  Service: Endoscopy;;  duodenal and gastric biopsy  . COLONOSCOPY  01/18/2008   VHQ:IONGEX rectum.  Long redundant colon, a diminutive sigmoid polyp status post cold biopsy removed. Hyperplastic polyp. Repeat colonoscopy June 2014 due to family history of colon cancer  . COLONOSCOPY WITH ESOPHAGOGASTRODUODENOSCOPY (EGD) N/A 11/02/2012   BMW:UXLKGMWN gastric mucosa of doubtful, +H.pylori. Incomplete colonoscopy due to patient unable to tolerate exam, proximal colon seen. Patient refused ACBE.  Marland Kitchen COLONOSCOPY WITH PROPOFOL N/A 03/08/2013   Dr. Jena Gauss: colonic polyp-removed as scribed above. Internal Hemorrhoids. Pathology did not reveal any colonic tissue, only mucus. SURVEILLANCE DUE Aug 2019  . COLONOSCOPY WITH PROPOFOL N/A 01/19/2018   Procedure: COLONOSCOPY WITH PROPOFOL;  Surgeon: Corbin Ade, MD;  Location: [facility];  Service: Endoscopy;  Laterality: N/A;  9:45am  . ESOPHAGEAL DILATION  12/20/2017   Procedure: ESOPHAGEAL DILATION;  Surgeon: West Bali, MD;  Location: [facility];  Service: Endoscopy;;  . ESOPHAGOGASTRODUODENOSCOPY  01/18/2008   RMR: Normal esophagus,  normal  stomach  . ESOPHAGOGASTRODUODENOSCOPY (EGD) WITH PROPOFOL N/A 02/09/2016   Normal esophagus, small hiatal hernia, portal hypertensive gastropathy, normal second portion of duodenum. Due in July 2019  . ESOPHAGOGASTRODUODENOSCOPY (EGD) WITH PROPOFOL N/A 12/20/2017   Dr. Darrick Penna: benign appearing esophageal stenosis s/p  dilation, mild pyloric stenosis s/p biopsy and dilation, mild duodenitis.   Marland Kitchen HYSTEROSCOPY  05/11/2016   Procedure: HYSTEROSCOPY;  Surgeon: Tilda Burrow, MD;  Location: [facility];  Service: Gynecology;;  . KNEE SURGERY     left knee  . MULTIPLE EXTRACTIONS WITH ALVEOLOPLASTY N/A 10/15/2013   Procedure: MULTIPLE EXTRACTION WITH ALVEOLOPLASTY;  Surgeon: Georgia Lopes, DDS;  Location: [facility];  Service: Oral Surgery;  Laterality: N/A;  . POLYPECTOMY N/A 03/08/2013   Procedure: POLYPECTOMY;  Surgeon: Corbin Ade, MD;  Location: [facility];  Service: Endoscopy;  Laterality: N/A;  . POLYPECTOMY N/A 05/11/2016   Procedure: REMOVAL OF ENDOMETRIAL POLYP;  Surgeon: Tilda Burrow, MD;  Location: [facility];  Service: Gynecology;  Laterality: N/A;  . TOTAL KNEE ARTHROPLASTY Left 02/05/2015   Procedure: LEFT TOTAL KNEE ARTHROPLASTY;  Surgeon: Vickki Hearing, MD;  Location: [facility];  Service: Orthopedics;  Laterality: Left;   Social History:  reports that she has been smoking cigarettes.  She has a 20.00 pack-year smoking history. She has never used smokeless tobacco. She reports that she does not drink alcohol or use drugs. Patient lives at home  Allergies  Allergen Reactions  . Penicillins Rash    Has patient had a PCN reaction causing immediate rash, facial/tongue/throat swelling, SOB or lightheadedness with hypotension: Yes Has patient had a PCN reaction causing severe rash involving mucus membranes or skin necrosis: No Has patient had a PCN reaction that required hospitalization No Has patient had a PCN reaction occurring within the last 10 years: No If all of the above answers are "NO", then may proceed with Cephalosporin use.     Family History  Problem Relation Age of Onset  . Colon cancer Brother 54          . Multiple myeloma Brother   . Liver cancer Sister   . Prostate cancer Brother   . Pancreatic cancer Brother   . Cancer Mother        breast  . Asthma Mother       Prior to  Admission medications   Medication Sig Start Date End Date Taking? Authorizing Provider  amLODipine (NORVASC) 10 MG tablet Take 1 tablet (10 mg total) by mouth daily. 12/23/17  Yes Erick Blinks, MD  budesonide-formoterol (SYMBICORT) 160-4.5 MCG/ACT inhaler Inhale 2 puffs into the lungs 2 (two) times daily.   Yes [provider]  Cyanocobalamin (VITAMIN B 12 PO) Take 1 tablet by mouth daily.   Yes [provider]  DULoxetine (CYMBALTA) 30 MG capsule Take 1 capsule by mouth 2 (two) times daily. 11/02/17  Yes [provider]  HYDROcodone-acetaminophen (NORCO/VICODIN) 5-325 MG tablet Take 1 tablet by mouth as needed for moderate pain.  11/02/17  Yes [provider]  hydrOXYzine (ATARAX/VISTARIL) 10 MG tablet  11/30/17  Yes [provider]  losartan-hydrochlorothiazide (HYZAAR) 100-12.5 MG tablet Take 1 tablet by mouth daily. 01/09/18  Yes [provider]  megestrol (MEGACE) 40 MG tablet Take 40 mg by mouth daily.   Yes [provider]  metoprolol tartrate (LOPRESSOR) 25 MG tablet Take 1 tablet (25 mg total) by mouth 2 (two) times daily. 12/22/17  Yes Erick Blinks, MD  ondansetron (ZOFRAN ODT)  4 MG disintegrating tablet Take 1 tablet (4 mg total) by mouth 3 (three) times daily. 4mg  ODT q4 hours prn nausea/vomit 12/22/17  Yes Memon, Durward Mallard, MD  pantoprazole (PROTONIX) 40 MG tablet Take 1 tablet (40 mg total) by mouth 2 (two) times daily before a meal. 12/22/17  Yes Memon, Durward Mallard, MD  Plecanatide (TRULANCE) 3 MG TABS Take 1 tablet by mouth daily. 05/09/17  Yes Tiffany Kocher, PA-C  GAVILYTE-G 236 g solution as needed.  11/16/17   [provider]    Physical Exam: BP (!) 187/99   Pulse 85   Temp (!) 100.6 F (38.1 C) (Oral)   Resp 18   Ht 5\' 6"  (1.676 m)   Wt 80.7 kg (178 lb)   SpO2 98%   BMI 28.73 kg/m   . General: Older black female. Awake and alert and oriented x3. No acute cardiopulmonary distress.  Marland Kitchen HEENT: Normocephalic  atraumatic.  Right and left ears normal in appearance.  Pupils equal, round, reactive to light. Extraocular muscles are intact. Sclerae anicteric and noninjected.  Moist mucosal membranes. No mucosal lesions.  . Neck: Neck supple without lymphadenopathy. No carotid bruits. No masses palpated.  . Cardiovascular: Regular rate with normal S1-S2 sounds. No murmurs, rubs, gallops auscultated. No JVD.  Marland Kitchen Respiratory: Good respiratory effort with no wheezes, rales, rhonchi. Lungs clear to auscultation bilaterally.  No accessory muscle use. . Abdomen: Patient able to sit up fairly easily without assistance.  Soft, tender in the left upper quadrant and epigastric area without rebound or guarding. Nondistended. Active bowel sounds. No masses or hepatosplenomegaly  . Skin: No rashes, lesions, or ulcerations.  Dry, warm to touch. 2+ dorsalis pedis and radial pulses. . Musculoskeletal: No calf or leg pain. All major joints not erythematous nontender.  No upper or lower joint deformation.  Good ROM.  No contractures  . Psychiatric: Intact judgment and insight. Pleasant and cooperative. . Neurologic: No focal neurological deficits. Strength is 5/5 and symmetric in upper and lower extremities.  Cranial nerves II through XII are grossly intact.           Labs on Admission: I have personally reviewed following labs and imaging studies  CBC: Recent Labs  Lab 01/28/18 1550  WBC 10.0  HGB 14.0  HCT 41.2  MCV 94.7  PLT 230   Basic Metabolic Panel: Recent Labs  Lab 01/28/18 1550  NA 133*  K 3.2*  CL 104  CO2 18*  GLUCOSE 137*  BUN 7*  CREATININE 0.68  CALCIUM 10.7*   GFR: Estimated Creatinine Clearance: 78.2 mL/min (by C-G formula based on SCr of 0.68 mg/dL). Liver Function Tests: Recent Labs  Lab 01/28/18 1550  AST 19  ALT 17  ALKPHOS 65  BILITOT 1.4*  PROT 9.0*  ALBUMIN 4.7   Recent Labs  Lab 01/28/18 1550  LIPASE 39   No results for input(s): AMMONIA in the last 168  hours. Coagulation Profile: No results for input(s): INR, PROTIME in the last 168 hours. Cardiac Enzymes: No results for input(s): CKTOTAL, CKMB, CKMBINDEX, TROPONINI in the last 168 hours. BNP (last 3 results) No results for input(s): PROBNP in the last 8760 hours. HbA1C: No results for input(s): HGBA1C in the last 72 hours. CBG: No results for input(s): GLUCAP in the last 168 hours. Lipid Profile: No results for input(s): CHOL, HDL, LDLCALC, TRIG, CHOLHDL, LDLDIRECT in the last 72 hours. Thyroid Function Tests: No results for input(s): TSH, T4TOTAL, FREET4, T3FREE, THYROIDAB in the last 72 hours. Anemia  Panel: No results for input(s): VITAMINB12, FOLATE, FERRITIN, TIBC, IRON, RETICCTPCT in the last 72 hours. Urine analysis:    Component Value Date/Time   COLORURINE YELLOW 01/28/2018 1950   APPEARANCEUR CLEAR 01/28/2018 1950   LABSPEC 1.015 01/28/2018 1950   PHURINE 5.0 01/28/2018 1950   GLUCOSEU 50 (A) 01/28/2018 1950   HGBUR SMALL (A) 01/28/2018 1950   BILIRUBINUR NEGATIVE 01/28/2018 1950   KETONESUR 20 (A) 01/28/2018 1950   PROTEINUR 100 (A) 01/28/2018 1950   UROBILINOGEN 0.2 04/01/2015 1140   NITRITE NEGATIVE 01/28/2018 1950   LEUKOCYTESUR NEGATIVE 01/28/2018 1950   Sepsis Labs: @LABRCNTIP (procalcitonin:4,lacticidven:4) )No results found for this or any previous visit (from the past 240 hour(s)).   Radiological Exams on Admission: No results found.    Assessment/Plan: Principal Problem:   Intractable cyclical vomiting Active Problems:   Chronic [name] with cirrhosis (HCC)   Alcoholic cirrhosis of liver without ascites (HCC)   Essential hypertension   GERD (gastroesophageal reflux disease)   Chronic abdominal pain    This patient was discussed with the ED physician, including pertinent vitals, physical exam findings, labs, and imaging.  We also discussed care given by the ED provider.  1. Intractable cyclical vomiting a. Observation on  MedSurg b. Patient did have a slight fever -question gastroenteritis.  Will give antipyretics and observe for now c. Antiemetics d. Clear fluids e. Mild hypokalemia due to vomiting f. If continues despite antiemetics, may try Haldol as this is quite effective to treat marijuana cyclical vomiting g. Patient has had a fairly extensive work-up in the past including gastric emptying study, which was normal h. GI consult i. Will try to avoid narcotics as this can compound the issue.  We will treat abdominal pain with Toradol j. Continue PPI  2. Hypertension a. As patient vomiting, will hold amlodipine and give patient IV metoprolol 3. Cirrhosis 4. Chronic hep C 5. GERD a. Continue PPI 6. Chronic pain a. Toradol.  Restart home medicines when able to tolerate p.o.  DVT prophylaxis: Lovenox Consultants: GI Code Status: Full code Family Communication: None Disposition Plan: Patient to return home following admission   Noralee Chars [facility] Pager [phone]  If 7PM-7AM, please contact night-coverage [email or web address] Password [email or web address]

## 2018-01-29 ENCOUNTER — Encounter (HOSPITAL_COMMUNITY): Payer: Self-pay | Admitting: Gastroenterology

## 2018-01-29 DIAGNOSIS — J45909 Unspecified asthma, uncomplicated: Secondary | ICD-10-CM | POA: Diagnosis present

## 2018-01-29 DIAGNOSIS — E876 Hypokalemia: Secondary | ICD-10-CM | POA: Diagnosis present

## 2018-01-29 DIAGNOSIS — E860 Dehydration: Secondary | ICD-10-CM | POA: Diagnosis present

## 2018-01-29 DIAGNOSIS — R109 Unspecified abdominal pain: Secondary | ICD-10-CM | POA: Diagnosis present

## 2018-01-29 DIAGNOSIS — Z8619 Personal history of other infectious and parasitic diseases: Secondary | ICD-10-CM | POA: Diagnosis not present

## 2018-01-29 DIAGNOSIS — Z79899 Other long term (current) drug therapy: Secondary | ICD-10-CM | POA: Diagnosis not present

## 2018-01-29 DIAGNOSIS — G8929 Other chronic pain: Secondary | ICD-10-CM

## 2018-01-29 DIAGNOSIS — Z7951 Long term (current) use of inhaled steroids: Secondary | ICD-10-CM | POA: Diagnosis not present

## 2018-01-29 DIAGNOSIS — K7030 Alcoholic cirrhosis of liver without ascites: Secondary | ICD-10-CM | POA: Diagnosis present

## 2018-01-29 DIAGNOSIS — I10 Essential (primary) hypertension: Secondary | ICD-10-CM | POA: Diagnosis present

## 2018-01-29 DIAGNOSIS — Z96652 Presence of left artificial knee joint: Secondary | ICD-10-CM | POA: Diagnosis present

## 2018-01-29 DIAGNOSIS — R112 Nausea with vomiting, unspecified: Secondary | ICD-10-CM | POA: Diagnosis not present

## 2018-01-29 DIAGNOSIS — G894 Chronic pain syndrome: Secondary | ICD-10-CM | POA: Diagnosis present

## 2018-01-29 DIAGNOSIS — G43A1 Cyclical vomiting, intractable: Secondary | ICD-10-CM | POA: Diagnosis present

## 2018-01-29 DIAGNOSIS — M797 Fibromyalgia: Secondary | ICD-10-CM | POA: Diagnosis present

## 2018-01-29 DIAGNOSIS — G43A0 Cyclical vomiting, not intractable: Secondary | ICD-10-CM | POA: Diagnosis not present

## 2018-01-29 DIAGNOSIS — K219 Gastro-esophageal reflux disease without esophagitis: Secondary | ICD-10-CM

## 2018-01-29 DIAGNOSIS — K7460 Unspecified cirrhosis of liver: Secondary | ICD-10-CM | POA: Diagnosis not present

## 2018-01-29 DIAGNOSIS — F17210 Nicotine dependence, cigarettes, uncomplicated: Secondary | ICD-10-CM | POA: Diagnosis present

## 2018-01-29 DIAGNOSIS — Z880 Allergy status to penicillin: Secondary | ICD-10-CM | POA: Diagnosis not present

## 2018-01-29 DIAGNOSIS — B182 Chronic viral hepatitis C: Secondary | ICD-10-CM | POA: Diagnosis present

## 2018-01-29 LAB — CBC
HCT: 42.7 % (ref 36.0–46.0)
Hemoglobin: 14.5 g/dL (ref 12.0–15.0)
MCH: 31.8 pg (ref 26.0–34.0)
MCHC: 34 g/dL (ref 30.0–36.0)
MCV: 93.6 fL (ref 78.0–100.0)
Platelets: 237 10*3/uL (ref 150–400)
RBC: 4.56 MIL/uL (ref 3.87–5.11)
RDW: 14.6 % (ref 11.5–15.5)
WBC: 12.5 10*3/uL — ABNORMAL HIGH (ref 4.0–10.5)

## 2018-01-29 LAB — BASIC METABOLIC PANEL
Anion gap: 11 (ref 5–15)
BUN: 9 mg/dL (ref 8–23)
CO2: 18 mmol/L — ABNORMAL LOW (ref 22–32)
Calcium: 10.5 mg/dL — ABNORMAL HIGH (ref 8.9–10.3)
Chloride: 105 mmol/L (ref 98–111)
Creatinine, Ser: 0.71 mg/dL (ref 0.44–1.00)
GFR calc Af Amer: >60 mL/min (ref >60)
GFR calc non Af Amer: >60 mL/min (ref >60)
Glucose, Bld: 123 mg/dL — ABNORMAL HIGH (ref 70–99)
Potassium: 3.6 mmol/L (ref 3.5–5.1)
Sodium: 134 mmol/L — ABNORMAL LOW (ref 135–145)

## 2018-01-29 MED ORDER — CLONIDINE HCL 0.1 MG PO TABS
0.1000 mg | ORAL_TABLET | Freq: Once | ORAL | Status: AC
  Administered 2018-01-29: 0.1 mg via ORAL
  Filled 2018-01-29: qty 1

## 2018-01-29 MED ORDER — DULOXETINE HCL 30 MG PO CPEP
30.0000 mg | ORAL_CAPSULE | Freq: Every day | ORAL | Status: DC
  Administered 2018-01-29 – 2018-01-30 (×2): 30 mg via ORAL
  Filled 2018-01-29 (×2): qty 1

## 2018-01-29 MED ORDER — HYDRALAZINE HCL 25 MG PO TABS
50.0000 mg | ORAL_TABLET | Freq: Three times a day (TID) | ORAL | Status: DC
  Administered 2018-01-29 – 2018-01-30 (×3): 50 mg via ORAL
  Filled 2018-01-29 (×3): qty 2

## 2018-01-29 MED ORDER — HYDROCODONE-ACETAMINOPHEN 5-325 MG PO TABS
1.0000 | ORAL_TABLET | Freq: Four times a day (QID) | ORAL | Status: DC | PRN
  Administered 2018-01-29 – 2018-01-30 (×4): 1 via ORAL
  Filled 2018-01-29 (×6): qty 1

## 2018-01-29 MED ORDER — HYDRALAZINE HCL 25 MG PO TABS
25.0000 mg | ORAL_TABLET | Freq: Four times a day (QID) | ORAL | Status: DC | PRN
  Administered 2018-01-29: 25 mg via ORAL
  Filled 2018-01-29: qty 1

## 2018-01-29 MED ORDER — HYDRALAZINE HCL 20 MG/ML IJ SOLN: 5.0000 mg | INTRAMUSCULAR | Status: DC | PRN

## 2018-01-29 MED ORDER — LABETALOL HCL 200 MG PO TABS
100.0000 mg | ORAL_TABLET | Freq: Two times a day (BID) | ORAL | Status: DC
  Administered 2018-01-29 – 2018-01-30 (×3): 100 mg via ORAL
  Filled 2018-01-29 (×3): qty 1

## 2018-01-29 MED ORDER — METOCLOPRAMIDE HCL 10 MG PO TABS
5.0000 mg | ORAL_TABLET | Freq: Three times a day (TID) | ORAL | Status: DC
  Administered 2018-01-29 – 2018-01-30 (×3): 5 mg via ORAL
  Filled 2018-01-29 (×4): qty 1

## 2018-01-29 MED ORDER — FUROSEMIDE 20 MG PO TABS
20.0000 mg | ORAL_TABLET | Freq: Every day | ORAL | Status: DC
  Administered 2018-01-29 – 2018-01-30 (×2): 20 mg via ORAL
  Filled 2018-01-29 (×2): qty 1

## 2018-01-29 MED ORDER — HYDROMORPHONE HCL 1 MG/ML IJ SOLN
1.0000 mg | Freq: Once | INTRAMUSCULAR | Status: AC
  Administered 2018-01-29: 1 mg via INTRAVENOUS
  Filled 2018-01-29: qty 1

## 2018-01-29 NOTE — Progress Notes (Signed)
Patients BP 186/107 at 0500 scheduled metoprolol given and re-checked at 0600 BP 196/107. Patient stated that pain medication was not working and that is why her BP was high d/t abdominal pain. Notified MD, new orders given for one time dose dilaudid and prn hydralazine, given. BP 179/92 after at 0658. Will continue to monitor.

## 2018-01-29 NOTE — Progress Notes (Signed)
Mid-level notified. Meds not due until 2200.  No PRN ordered   01/29/18 1911  Vitals  BP (!) 183/107  MAP (mmHg) 126  BP Location Left Arm  BP Method Automatic  Patient Position (if appropriate) Lying  Pulse Rate 83  Pulse Rate Source Monitor  Oxygen Therapy  SpO2 100 %

## 2018-01-29 NOTE — Consult Note (Addendum)
Referring Provider: No ref. provider found Primary Care Physician:  Rosita Fire, MD Primary Gastroenterologist:  Dr. Gala Romney   Date of Admission: 01/28/18 Date of Consultation: 01/29/18  Reason for Consultation:  Intractable N/V  HPI:  Colleen Lee is a 62 y.o. 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 She was inpatient May 2019 with acute on chronic abdominal pain, N/V/D. EGD during that admission May 2019 with benign appearing esophageal stenosis s/p dilation, mild pyloric stenosis s/p dilation, mild duodenitis. GES as outpatient then completed June 2019 and normal.    She was seen Dec 29, 2017 by myself and significantly improved from hospitalization. At that time, she weighed 174. Today 164. Nov 2018 190. She tells me that she made a crab salad a few days ago and ate it Thursday without any issues. She ate it again on Friday, but no one else ate this from her household. She woke up early Saturday morning around 4 am with N/V, periumbilical abdominal pain, and watery diarrhea. Presented to ED due to persistent symptoms. Had chills but no fever. Diarrhea now resolved. Had "mushy" stool early this morning. Feels this is much improved. Nausea improved. Vomited after taking in one pill this morning, but she later took the rest of her medications without any problems. She is hungry and wants to advance her diet. Abdominal pain has also improved. Peri-umbilical discomfort noted intermittently. Denies postprandial abdominal pain. She states her appetite has been decreased since May 2019, last admission.   Colonoscopy June 2019: 6 mm cecal polyp (inflammatory polyp), otherwise normal.   During last  admission, fasting am cortisol elevated at 39. She was to be referred to endocrinology as outpatient; need to arrange.   Past Medical History:  Diagnosis Date  . Asthma   . Chronic abdominal pain   . Chronic back pain   . Chronic constipation   . Cirrhosis ([place])    Metavir score F4 on elastography 2015  . Cyclical vomiting   . Fibroids   . Fibromyalgia   . GERD (gastroesophageal reflux disease)   . H. pylori infection 2014   treated with pylera, had to be treated again as it was not eradicated. Urea breath test then negative after subsequent treatment.   . [name]    HCV RNA positive 09/2012  . Hypertension   . Marijuana use   . Nausea and vomiting    chronic, recurrent  . PONV (postoperative nausea and vomiting)    pt thinks maybe once she had N&V  . Sciatica of left side     Past Surgical History:  Procedure Laterality Date  . BALLOON DILATION  12/20/2017   Procedure: BALLOON DILATION;  Surgeon: Danie Binder, MD;  [facility];  Service: Endoscopy;;  pyloric dilation  . BIOPSY  12/20/2017   Procedure: BIOPSY;  Surgeon: Danie Binder, MD;  [facility];  Service: Endoscopy;;  duodenal and gastric biopsy  . COLONOSCOPY  01/18/2008   YYT:[id number] rectum.  Long redundant colon, a diminutive sigmoid polyp status post cold biopsy removed. Hyperplastic polyp. Repeat colonoscopy June 2014 due to family history of colon cancer  . COLONOSCOPY WITH ESOPHAGOGASTRODUODENOSCOPY (EGD) N/A 11/02/2012   KCL:EXNTZGYF gastric mucosa of doubtful, +H.pylori. Incomplete colonoscopy due to  patient unable to tolerate exam, proximal colon seen. Patient refused ACBE.  Marland Kitchen COLONOSCOPY WITH PROPOFOL N/A 03/08/2013   Dr. Gala Romney: colonic polyp-removed as scribed above. Internal Hemorrhoids. Pathology did not reveal any colonic tissue, only mucus. SURVEILLANCE DUE Aug 2019  . COLONOSCOPY WITH PROPOFOL N/A 01/19/2018   Dr. Gala Romney: 6 mm cecal inflammatoy polyp, otherwise normal.,  Surveilance in 5 year s  . ESOPHAGEAL DILATION  12/20/2017   Procedure: ESOPHAGEAL DILATION;  Surgeon: Danie Binder, MD;  [facility];  Service: Endoscopy;;  . ESOPHAGOGASTRODUODENOSCOPY  01/18/2008   RMR: Normal esophagus, normal  stomach  . ESOPHAGOGASTRODUODENOSCOPY (EGD) WITH PROPOFOL N/A 02/09/2016   Normal esophagus, small hiatal hernia, portal hypertensive gastropathy, normal second portion of duodenum. Due in July 2019  . ESOPHAGOGASTRODUODENOSCOPY (EGD) WITH PROPOFOL N/A 12/20/2017   Dr. Oneida Alar: benign appearing esophageal stenosis s/p dilation, mild pyloric stenosis s/p biopsy and dilation, mild duodenitis.   Marland Kitchen HYSTEROSCOPY  05/11/2016   Procedure: HYSTEROSCOPY;  Surgeon: Jonnie Kind, MD;  Location: [facility];  Service: Gynecology;;  . KNEE SURGERY     left knee  . MULTIPLE EXTRACTIONS WITH ALVEOLOPLASTY N/A 10/15/2013   Procedure: MULTIPLE EXTRACTION WITH ALVEOLOPLASTY;  Surgeon: Gae Bon, DDS;  Location: [facility];  Service: Oral Surgery;  Laterality: N/A;  . POLYPECTOMY N/A 03/08/2013   Procedure: POLYPECTOMY;  Surgeon: Daneil Dolin, MD;  Location: [facility];  Service: Endoscopy;  Laterality: N/A;  . POLYPECTOMY N/A 05/11/2016   Procedure: REMOVAL OF ENDOMETRIAL POLYP;  Surgeon: Jonnie Kind, MD;  Location: [facility];  Service: Gynecology;  Laterality: N/A;  . TOTAL KNEE ARTHROPLASTY Left 02/05/2015   Procedure: LEFT TOTAL KNEE ARTHROPLASTY;  Surgeon: Carole Civil, MD;  Location: [facility];  Service: Orthopedics;  Laterality: Left;    Prior to Admission medications   Medication Sig Start Date End Date Taking? Authorizing Provider  amLODipine (NORVASC) 10 MG tablet Take 1 tablet (10 mg total) by mouth daily. 12/23/17  Yes Kathie Dike, MD  budesonide-formoterol (SYMBICORT) 160-4.5 MCG/ACT inhaler Inhale 2 puffs into the lungs 2 (two) times daily.   Yes [provider]  Cyanocobalamin (VITAMIN B 12 PO) Take 1 tablet by mouth daily.   Yes [provider]  DULoxetine (CYMBALTA) 30 MG capsule Take 1 capsule by mouth 2 (two) times daily. 11/02/17  Yes [provider]  HYDROcodone-acetaminophen (NORCO/VICODIN) 5-325 MG tablet Take 1 tablet by mouth as needed for moderate pain.  11/02/17  Yes [provider]  hydrOXYzine (ATARAX/VISTARIL) 10 MG tablet  11/30/17  Yes [provider]  losartan-hydrochlorothiazide (HYZAAR) 100-12.5 MG tablet Take 1 tablet by mouth daily. 01/09/18  Yes [provider]  megestrol (MEGACE) 40 MG tablet Take 40 mg by mouth daily.   Yes [provider]  metoprolol tartrate (LOPRESSOR) 25 MG tablet Take 1 tablet (25 mg total) by mouth 2 (two) times daily. 12/22/17  Yes Kathie Dike, MD  ondansetron (ZOFRAN ODT) 4 MG disintegrating tablet Take 1 tablet (4 mg total) by mouth 3 (three) times daily. '4mg'$  ODT q4 hours prn nausea/vomit 12/22/17  Yes Memon, Jolaine Artist, MD  pantoprazole (PROTONIX) 40 MG tablet Take 1 tablet (40 mg total) by mouth 2 (two) times daily before a meal. 12/22/17  Yes Memon, Jolaine Artist, MD  Plecanatide (TRULANCE) 3 MG TABS Take 1 tablet by mouth daily. 05/09/17  Yes Mahala Menghini, PA-C  GAVILYTE-G 236 g solution as needed.  11/16/17   [provider]    Current  Facility-Administered Medications  Medication Dose Route Frequency Provider Last Rate Last Dose  . 0.9 % NaCl with KCl 40 mEq / L  infusion   Intravenous Continuous Barton Dubois, MD 125 mL/hr at 01/29/18 0630 125 mL/hr at 01/29/18 0630  . acetaminophen (TYLENOL) tablet 650 mg  650 mg Oral Q6H PRN Truett Mainland, DO   650 mg at 01/28/18 2146   Or  . acetaminophen (TYLENOL) suppository 650 mg  650 mg Rectal Q6H PRN Truett Mainland, DO      . DULoxetine (CYMBALTA) DR capsule 30 mg  30 mg Oral Daily Barton Dubois, MD   30 mg at 01/29/18 1240  . enoxaparin (LOVENOX) injection 40 mg  40 mg Subcutaneous Q24H Truett Mainland, DO   40 mg at 01/28/18 2146  . furosemide (LASIX) tablet 20 mg  20 mg  Oral Daily Barton Dubois, MD   20 mg at 01/29/18 0959  . hydrALAZINE (APRESOLINE) tablet 50 mg  50 mg Oral Q8H Barton Dubois, MD      . HYDROcodone-acetaminophen (NORCO/VICODIN) 5-325 MG per tablet 1 tablet  1 tablet Oral Q6H PRN Barton Dubois, MD   1 tablet at 01/29/18 1243  . labetalol (NORMODYNE) tablet 100 mg  100 mg Oral BID Barton Dubois, MD   100 mg at 01/29/18 0959  . losartan (COZAAR) tablet 100 mg  100 mg Oral Daily Truett Mainland, DO   100 mg at 01/29/18 1761  . metoCLOPramide (REGLAN) tablet 5 mg  5 mg Oral TID Maebelle Munroe, MD   5 mg at 01/29/18 1148  . mometasone-formoterol (DULERA) 200-5 MCG/ACT inhaler 2 puff  2 puff Inhalation BID Truett Mainland, DO   2 puff at 01/29/18 6073  . ondansetron (ZOFRAN) injection 4 mg  4 mg Intravenous Q6H PRN Truett Mainland, DO   4 mg at 01/29/18 0644  . pantoprazole (PROTONIX) EC tablet 40 mg  40 mg Oral BID AC Truett Mainland, DO   40 mg at 01/29/18 7106  . promethazine (PHENERGAN) injection 25 mg  25 mg Intravenous Q6H PRN Truett Mainland, DO      . zolpidem (AMBIEN) tablet 5 mg  5 mg Oral QHS PRN Truett Mainland, DO        Allergies as of 01/28/2018 - Review Complete 01/28/2018  Allergen Reaction Noted  . Penicillins Rash     Family History  Problem Relation Age of Onset  . Colon cancer Brother 109          . Multiple myeloma Brother   . Liver cancer Sister   . Prostate cancer Brother   . Pancreatic cancer Brother   . Cancer Mother        breast  . Asthma Mother     Social History   Socioeconomic History  . Marital status: Single    Spouse name: Not on file  . Number of children: 0  . Years of education: Not on file  . Highest education level: Not on file  Occupational History  . Occupation: umemployed   Social Needs  . Financial resource strain: Not on file  . Food insecurity:    Worry: Not on file    Inability: Not on file  . Transportation needs:    Medical: Not on file    Non-medical: Not on file   Tobacco Use  . Smoking status: Current Every Day Smoker    Packs/day: 0.50    Years: 40.00    Pack years:  20.00    Types: Cigarettes  . Smokeless tobacco: Never Used  . Tobacco comment: Smokes one pack of cigarettes daily  Substance and Sexual Activity  . Alcohol use: No    Alcohol/week: 0.0 oz  . Drug use: No    Types: Marijuana    Comment: history of marijuana in past  . Sexual activity: Never    Birth control/protection: None  Lifestyle  . Physical activity:    Days per week: Not on file    Minutes per session: Not on file  . Stress: Not on file  Relationships  . Social connections:    Talks on phone: Not on file    Gets together: Not on file    Attends religious service: Not on file    Active member of club or organization: Not on file    Attends meetings of clubs or organizations: Not on file    Relationship status: Not on file  . Intimate partner violence:    Fear of current or ex partner: Not on file    Emotionally abused: Not on file    Physically abused: Not on file    Forced sexual activity: Not on file  Other Topics Concern  . Not on file  Social History Narrative  . Not on file    Review of Systems: Gen: see HPI  CV: Denies chest pain, heart palpitations, syncope, edema  Resp: Denies shortness of breath with rest, cough, wheezing GI: see HPI  GU : Denies urinary burning, urinary frequency, urinary incontinence.  MS: Denies joint pain,swelling, cramping Derm: Denies rash, itching, dry skin Psych: Denies depression, anxiety,confusion, or memory loss Heme: Denies bruising, bleeding, and enlarged lymph nodes.  Physical Exam: Vital signs in last 24 hours: Temp:  [97.6 F (36.4 C)-101.2 F (38.4 C)] 98.5 F (36.9 C) (06/30 1320) Pulse Rate:  [74-93] 75 (06/30 1320) Resp:  [18-20] 19 (06/30 1320) BP: (149-199)/(86-112) 177/98 (06/30 1320) SpO2:  [77 %-100 %] 100 % (06/30 1320) Weight:  [164 lb 3.9 oz (74.5 kg)-178 lb (80.7 kg)] 164 lb 3.9 oz (74.5  kg) (06/29 2225) Last BM Date: 01/28/18 General:   Alert,  Well-developed, well-nourished, pleasant and cooperative in NAD Head:  Normocephalic and atraumatic. Eyes:  Sclera clear, no icterus.   Conjunctiva pink. Ears:  Normal auditory acuity. Nose:  No deformity, discharge,  or lesions. Mouth:  No deformity or lesions, dentition normal. Lungs:  Clear throughout to auscultation.   Heart:  S1 S2 present without murmurs  Abdomen:  Soft, mild TTP periumbilically and protuberant (baseline for patient). No guarding or rebound Rectal:  Deferred  Msk:  Symmetrical without gross deformities. Normal posture. Extremities:  Without edema. Neurologic:  Alert and  oriented x4 Psych:  Alert and cooperative. Normal mood and affect.  Intake/Output from previous day: 06/29 0701 - 06/30 0700 In: 1610.4 [I.V.:610.4; IV Piggyback:1000] Out: -  Intake/Output this shift: No intake/output data recorded.  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 She has had intermittent nausea, vomiting, abdominal pain and recently inpatient May 2019 due to same. EGD at that time with benign appearing esophageal stenosis s/p dilation, mild pyloric stenosis s/p dilation, mild duodenitis.  Presenting this admission with acute N/V/D, which she feels is related to the crab salad she made recently, as she notes symptoms occurring shortly thereafter.   Clinically, she is improved with resolution of diarrhea, abdominal pain and N/V improved. Could have had a self-limiting gastroenteritis; however, she has known celiac artery compression syndrome as documented on CTA last year and most recently mesenteric duplex April 2019. Weight loss also documented in epic, and she has lost an additional 10 lbs since outpatient visit Dec 29, 2017. I personally saw her at that appointment, and she had been doing well without any postprandial abdominal pain. She continues to deny persistent or daily postprandial symptoms but endorses decreased appetite. No urgent need for imaging this admission unless she has worsening of pain, then would pursue CTA. Discussed with patient outpatient Vascular appointment due to constellation of symptoms.  As she has clinically improved, continue to provide supportive measures. If recurrent diarrhea, check stool studies. Hopefully, she will tolerate a soft diet this evening and can be discharged tomorrow if she continues to progress as she is doing.   Plan: Advance to soft diet this evening Monitor for any recurrent diarrhea If further abdominal pain or worsening: update abdominal imaging PPI BID Zofran prn nausea  Outpatient Vascular appointment to be arranged Cirrhosis imaging due again in Aug 2019; she is well-compensated from this standpoint Outpatient referral to endocrine as previously recommended during last admission due to elevated cortisol level Will reassess in am   Annitta Needs, PhD, ANP-BC [facility] Gastroenterology      LOS: 0 days    01/29/2018, 1:20 PM

## 2018-01-29 NOTE — Progress Notes (Signed)
[name] IV infiltrated.  2 RNs attempted IV insertion without success.  Dr. Dyann Kief notified and gave order to insert midline.

## 2018-01-29 NOTE — Progress Notes (Signed)
Vascular access notified of order to place midline.  ETA 1430 per Vascular Access.

## 2018-01-29 NOTE — Progress Notes (Signed)
PROGRESS NOTE    Colleen Lee  NLZ:[id number] DOB: 31-Oct-1955 DOA: 01/28/2018 PCP: Rosita Fire, MD    Brief Narrative:  62 y.o. female with a history of asthma, chronic pain, history of H. 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 presented with intractable nausea, vomiting and abd pain. Patient dehydrated and unable to keep meds down. GI service consulted.  Assessment & Plan: 1-intractable cyclical vomiting -with concerns for cyclical vomiting induced by marijuana or idiopathic. -will give empiric reglan, continue PRN antiemetics and slowly advance diet -continue symptomatic management. -continue PPI -advised to stop marijuana use -normal Gastric Emptying Study at the beginning of this month.  2-HTN -BP still elevated -will adjust antihypertensive regimen and follow VS -also try to control pain better  3-chronic abd pain  -in patient with underlying cirrhosis  -resume oral pain meds -follow clinical response -as per GI rec's, if pain persist will repeat CT abdomen   4-GERD -continue PPI  5-dehydration and hypokalemia -in the setting of GI loses and poor oral intake -continue IVF's -replete electrolytes as needed   6-chronic pain syndrome -slowly resume adjusted home medication regimen -minimize/avoid IV narcotics   DVT prophylaxis: lovenox Code Status: Full Family Communication: no family at bedside  Disposition Plan: home in the next 24-48 hours. Continue IVF's, PRN antiemetics and analgesics.   Consultants:   GI  Procedures:     Antimicrobials:  Anti-infectives (From admission, onward)   None      Subjective: Continue to feel nauseated, but no further vomiting. Patient reporting abd pain. No SOB, no CP.  Objective: Vitals:   01/29/18 0600 01/29/18 0658 01/29/18 0946 01/29/18 1320  BP: (!) 196/107 (!) 179/92 (!) 149/100 (!) 177/98    Pulse: 85 74 89 75  Resp:   20 19  Temp:    98.5 F (36.9 C)  TempSrc:    Oral  SpO2:   99% 100%  Weight:      Height:        Intake/Output Summary (Last 24 hours) at 01/29/2018 1735 Last data filed at 01/29/2018 0336 Gross per 24 hour  Intake 1610.42 ml  Output -  Net 1610.42 ml   Filed Weights   01/28/18 1515 01/28/18 2225  Weight: 80.7 kg (178 lb) 74.5 kg (164 lb 3.9 oz)    Examination: General exam: Alert, awake, oriented x 3; complaining of nausea and abd pain. Respiratory system: Clear to auscultation. Respiratory effort normal. Cardiovascular system:RRR. No murmurs, rubs, gallops. Gastrointestinal system: Abdomen is nondistended, soft and tender to palpation (upper quadrants). No organomegaly or masses felt. Normal bowel sounds heard. Central nervous system: Alert and oriented. No focal neurological deficits. Extremities: No Cyanosis, trace edema bilaterally, +pedal pulses Skin: No rashes, lesions or ulcers Psychiatry: Judgement and insight appear normal. Mood & affect appropriate.    Data Reviewed: I have personally reviewed following labs and imaging studies  CBC: Recent Labs  Lab 01/28/18 1550 01/29/18 0734  WBC 10.0 12.5*  HGB 14.0 14.5  HCT 41.2 42.7  MCV 94.7 93.6  PLT 230 902   Basic Metabolic Panel: Recent Labs  Lab 01/28/18 1550 01/29/18 0734  NA 133* 134*  K 3.2* 3.6  CL 104 105  CO2 18* 18*  GLUCOSE 137* 123*  BUN 7* 9  CREATININE 0.68 0.71  CALCIUM 10.7* 10.5*   GFR: Estimated Creatinine Clearance: 75.3 mL/min (by C-G formula based on SCr of 0.71 mg/dL).  Liver Function Tests: Recent Labs  Lab 01/28/18 1550  AST 19  ALT 17  ALKPHOS 65  BILITOT 1.4*  PROT 9.0*  ALBUMIN 4.7   Recent Labs  Lab 01/28/18 1550  LIPASE 39   Urine analysis:    Component Value Date/Time   COLORURINE YELLOW 01/28/2018 1950   APPEARANCEUR CLEAR 01/28/2018 1950   LABSPEC 1.015 01/28/2018 1950   PHURINE 5.0 01/28/2018 1950   GLUCOSEU 50 (A)  01/28/2018 1950   HGBUR SMALL (A) 01/28/2018 1950   BILIRUBINUR NEGATIVE 01/28/2018 1950   KETONESUR 20 (A) 01/28/2018 1950   PROTEINUR 100 (A) 01/28/2018 1950   UROBILINOGEN 0.2 04/01/2015 1140   NITRITE NEGATIVE 01/28/2018 1950   LEUKOCYTESUR NEGATIVE 01/28/2018 1950    Radiology Studies: No results found.  Scheduled Meds: . DULoxetine  30 mg Oral Daily  . enoxaparin (LOVENOX) injection  40 mg Subcutaneous Q24H  . furosemide  20 mg Oral Daily  . hydrALAZINE  50 mg Oral Q8H  . labetalol  100 mg Oral BID  . losartan  100 mg Oral Daily  . metoCLOPramide  5 mg Oral TID AC  . mometasone-formoterol  2 puff Inhalation BID  . pantoprazole  40 mg Oral BID AC   Continuous Infusions: . 0.9 % NaCl with KCl 40 mEq / L 100 mL/hr (01/29/18 1453)     LOS: 0 days    Time spent: 30 minutes    Barton Dubois, MD [facility] Pager [phone]  If 7PM-7AM, please contact night-coverage [email or web address] Password [facility] 01/29/2018, 5:35 PM

## 2018-01-30 DIAGNOSIS — K7460 Unspecified cirrhosis of liver: Secondary | ICD-10-CM

## 2018-01-30 DIAGNOSIS — B182 Chronic viral hepatitis C: Secondary | ICD-10-CM

## 2018-01-30 DIAGNOSIS — G43A0 Cyclical vomiting, not intractable: Secondary | ICD-10-CM

## 2018-01-30 DIAGNOSIS — K7030 Alcoholic cirrhosis of liver without ascites: Secondary | ICD-10-CM

## 2018-01-30 DIAGNOSIS — E860 Dehydration: Secondary | ICD-10-CM

## 2018-01-30 LAB — CBC
HCT: 38.9 % (ref 36.0–46.0)
Hemoglobin: 13.1 g/dL (ref 12.0–15.0)
MCH: 31.6 pg (ref 26.0–34.0)
MCHC: 33.7 g/dL (ref 30.0–36.0)
MCV: 94 fL (ref 78.0–100.0)
Platelets: 181 10*3/uL (ref 150–400)
RBC: 4.14 MIL/uL (ref 3.87–5.11)
RDW: 14.8 % (ref 11.5–15.5)
WBC: 10.6 10*3/uL — ABNORMAL HIGH (ref 4.0–10.5)

## 2018-01-30 LAB — BASIC METABOLIC PANEL
Anion gap: 9 (ref 5–15)
BUN: 10 mg/dL (ref 8–23)
CO2: 15 mmol/L — ABNORMAL LOW (ref 22–32)
Calcium: 9.9 mg/dL (ref 8.9–10.3)
Chloride: 107 mmol/L (ref 98–111)
Creatinine, Ser: 0.67 mg/dL (ref 0.44–1.00)
GFR calc Af Amer: >60 mL/min (ref >60)
GFR calc non Af Amer: >60 mL/min (ref >60)
Glucose, Bld: 114 mg/dL — ABNORMAL HIGH (ref 70–99)
Potassium: 4.1 mmol/L (ref 3.5–5.1)
Sodium: 131 mmol/L — ABNORMAL LOW (ref 135–145)

## 2018-01-30 LAB — MAGNESIUM: Magnesium: 2 mg/dL (ref 1.7–2.4)

## 2018-01-30 MED ORDER — LABETALOL HCL 100 MG PO TABS: 200.0000 mg | ORAL_TABLET | Freq: Two times a day (BID) | ORAL | 1 refills | Status: DC

## 2018-01-30 MED ORDER — LOSARTAN POTASSIUM 100 MG PO TABS: 100.0000 mg | ORAL_TABLET | Freq: Every day | ORAL | 1 refills | Status: DC

## 2018-01-30 MED ORDER — FUROSEMIDE 20 MG PO TABS: 20.0000 mg | ORAL_TABLET | Freq: Every day | ORAL | 1 refills | Status: DC

## 2018-01-30 MED ORDER — HYDRALAZINE HCL 50 MG PO TABS: 50.0000 mg | ORAL_TABLET | Freq: Three times a day (TID) | ORAL | 1 refills | Status: DC

## 2018-01-30 MED ORDER — METOCLOPRAMIDE HCL 5 MG PO TABS: 5.0000 mg | ORAL_TABLET | Freq: Three times a day (TID) | ORAL | 0 refills | Status: DC

## 2018-01-30 NOTE — Discharge Summary (Signed)
Physician Discharge Summary  Colleen Lee QPY:[id number] DOB: [birth year] DOA: 01/28/2018  PCP: Colleen Fire, MD  Admit date: 01/28/2018 Discharge date: 01/30/2018  Time spent: 35 minutes  Recommendations for Outpatient Follow-up:  1. Repeat BMET to follow electrolytes and renal function  2. Reassess BP and adjust antihypertensive regimen as needed   Discharge Diagnoses:  Principal Problem:   Intractable cyclical vomiting Active Problems:   Chronic [name] with cirrhosis (HCC)   Alcoholic cirrhosis of liver without ascites (HCC)   Intractable nausea and vomiting   Essential hypertension   GERD (gastroesophageal reflux disease)   Chronic abdominal pain   Dehydration   Discharge Condition: stable and improved. Discharge home with instructions to follow up with PCP.  Diet recommendation: heart healthy/low sodium diet; soft with low residue.  Filed Weights   01/28/18 1515 01/28/18 2225  Weight: 80.7 kg (178 lb) 74.5 kg (164 lb 3.9 oz)    History of present illness:  As per H&P written by Dr. Nehemiah Lee on 01/28/18 62 y.o. female with a history of asthma, chronic pain, history of H. 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  Patient came to the hospital for abdominal pain and vomiting that started around 4:00 this morning.  Emesis described as stomach contents or saliva.  Pain is in the left upper quadrant and is nonradiating and is quite severe.  No palliating or provoking factors.   She was hospitalized approximately a month ago for similar complaints, but had acute renal injury at that time.  It also appears that she comes to the emergency department for vomiting fairly frequently.  Hospital Course:  1-intractable cyclical vomiting -with concerns for cyclical vomiting induced by marijuana or idiopathic. -will give a trial of empiric reglan, continue PRN antiemetics and  follow soft low residue diet -advise to keep herself well hydrated  -continue PPI BID -advised to stop marijuana use -normal Gastric Emptying Study at the beginning of June.  2-HTN -BP improved, even still not at goal -will discharge on adjusted antihypertensive regimen  -also instructed to follow low sodium diet   3-chronic abd pain  -in a patient with underlying cirrhosis  -resume oral pain meds -follow up with PCP and GI services -instructed to follow low residue soft diet   4-GERD -continue PPI BID  5-dehydration and hypokalemia -in the setting of GI loses and poor oral intake -resolved with IVF's -repeat BMET to follow electrolytes trend  -advised to keep herself well hydrated   6-chronic pain syndrome -continue home analgesics regimen  -pain is well controlled -tolerating diet   Procedures:  None   Consultations:  GI service   Discharge Exam: Vitals:   01/30/18 0724 01/30/18 1323  BP:  (!) 165/87  Pulse:  76  Resp:  20  Temp:  98.3 F (36.8 C)  SpO2: 99% 100%    General exam: Alert, awake, oriented x 3; reported no nausea, no vomiting. Abd pain well controlled with oral meds and tolerating diet.  complaining of nausea and abd pain. Respiratory system: Clear to auscultation. Respiratory effort normal. Cardiovascular system:RRR. No murmurs, rubs, gallops. Gastrointestinal system: Abdomen is nondistended, soft and just mildly tender to palpation (upper quadrants). No organomegaly or masses felt. Normal bowel sounds heard. Central nervous system: Alert and oriented. No focal neurological deficits. Extremities: No Cyanosis, trace edema bilaterally, +pedal pulses Skin: No rashes, lesions or ulcers Psychiatry: Judgement and insight appear normal. Mood &  affect appropriate.    Discharge Instructions   Discharge Instructions    Diet - low sodium heart healthy   Complete by:  As directed    Discharge instructions   Complete by:  As directed     Take medications as prescribed  Keep yourself well hydrated  Arrange follow up with PCP in 10 days  Follow heart healthy and low residue diet     Allergies as of 01/30/2018      Reactions   Penicillins Rash   Has patient had a PCN reaction causing immediate rash, facial/tongue/throat swelling, SOB or lightheadedness with hypotension: Yes Has patient had a PCN reaction causing severe rash involving mucus membranes or skin necrosis: No Has patient had a PCN reaction that required hospitalization No Has patient had a PCN reaction occurring within the last 10 years: No If all of the above answers are "NO", then may proceed with Cephalosporin use.      Medication List    STOP taking these medications   amLODipine 10 MG tablet Commonly known as:  NORVASC   losartan-hydrochlorothiazide 100-12.5 MG tablet Commonly known as:  HYZAAR   metoprolol tartrate 25 MG tablet Commonly known as:  LOPRESSOR     TAKE these medications   budesonide-formoterol 160-4.5 MCG/ACT inhaler Commonly known as:  SYMBICORT Inhale 2 puffs into the lungs 2 (two) times daily.   DULoxetine 30 MG capsule Commonly known as:  CYMBALTA Take 1 capsule by mouth 2 (two) times daily.   furosemide 20 MG tablet Commonly known as:  LASIX Take 1 tablet (20 mg total) by mouth daily. Start taking on:  01/31/2018   GAVILYTE-G 236 g solution Generic drug:  polyethylene glycol as needed.   hydrALAZINE 50 MG tablet Commonly known as:  APRESOLINE Take 1 tablet (50 mg total) by mouth every 8 (eight) hours.   HYDROcodone-acetaminophen 5-325 MG tablet Commonly known as:  NORCO/VICODIN Take 1 tablet by mouth as needed for moderate pain.   hydrOXYzine 10 MG tablet Commonly known as:  ATARAX/VISTARIL   labetalol 100 MG tablet Commonly known as:  NORMODYNE Take 2 tablets (200 mg total) by mouth 2 (two) times daily.   losartan 100 MG tablet Commonly known as:  COZAAR Take 1 tablet (100 mg total) by mouth daily. Start  taking on:  01/31/2018   megestrol 40 MG tablet Commonly known as:  MEGACE Take 40 mg by mouth daily.   metoCLOPramide 5 MG tablet Commonly known as:  REGLAN Take 1 tablet (5 mg total) by mouth 3 (three) times daily before meals.   ondansetron 4 MG disintegrating tablet Commonly known as:  ZOFRAN ODT Take 1 tablet (4 mg total) by mouth 3 (three) times daily. 4mg  ODT q4 hours prn nausea/vomit   pantoprazole 40 MG tablet Commonly known as:  PROTONIX Take 1 tablet (40 mg total) by mouth 2 (two) times daily before a meal.   Plecanatide 3 MG Tabs Commonly known as:  TRULANCE Take 1 tablet by mouth daily.   VITAMIN B 12 PO Take 1 tablet by mouth daily.      Allergies  Allergen Reactions  . Penicillins Rash    Has patient had a PCN reaction causing immediate rash, facial/tongue/throat swelling, SOB or lightheadedness with hypotension: Yes Has patient had a PCN reaction causing severe rash involving mucus membranes or skin necrosis: No Has patient had a PCN reaction that required hospitalization No Has patient had a PCN reaction occurring within the last 10 years: No If all of  the above answers are "NO", then may proceed with Cephalosporin use.    Follow-up Information    Colleen Fire, MD. Schedule an appointment as soon as possible for a visit in 10 day(s).   Specialty:  Internal Medicine Contact information: [address] [place] [address] [phone]           The results of significant diagnostics from this hospitalization (including imaging, microbiology, ancillary and laboratory) are listed below for reference.    Significant Diagnostic Studies: Nm Gastric Emptying  Result Date: 01/11/2018 CLINICAL DATA:  Abdominal pain with nausea and vomiting EXAM: NUCLEAR MEDICINE GASTRIC EMPTYING SCAN TECHNIQUE: After oral ingestion of radiolabeled meal, sequential abdominal images were obtained for 4 hours. Percentage of activity emptying the stomach was  calculated at 1 hour, 2 hours, and 3 hours. RADIOPHARMACEUTICALS:  2.1 mCi Tc-42m sulfur colloid in standardized meal including egg COMPARISON:  None. FINDINGS: Expected location of the stomach in the left upper quadrant. Ingested meal empties the stomach gradually over the course of the study. 41.1% emptied at 1 hr ( normal >= 10%) 83.3% emptied at 2 hr ( normal >= 40%) 97.3% emptied at 3 hr ( normal >= 70%) Normal greater than 70% emptying after 4 hours. IMPRESSION: Normal gastric emptying study. Electronically Signed   By: Colleen Grip III M.D.   On: 01/11/2018 11:21   Labs: Basic Metabolic Panel: Recent Labs  Lab 01/28/18 1550 01/29/18 0734 01/30/18 0633  NA 133* 134* 131*  K 3.2* 3.6 4.1  CL 104 105 107  CO2 18* 18* 15*  GLUCOSE 137* 123* 114*  BUN 7* 9 10  CREATININE 0.68 0.71 0.67  CALCIUM 10.7* 10.5* 9.9  MG  --   --  2.0   Liver Function Tests: Recent Labs  Lab 01/28/18 1550  AST 19  ALT 17  ALKPHOS 65  BILITOT 1.4*  PROT 9.0*  ALBUMIN 4.7   Recent Labs  Lab 01/28/18 1550  LIPASE 39   CBC: Recent Labs  Lab 01/28/18 1550 01/29/18 0734 01/30/18 0633  WBC 10.0 12.5* 10.6*  HGB 14.0 14.5 13.1  HCT 41.2 42.7 38.9  MCV 94.7 93.6 94.0  PLT 230 237 181    Signed:  Barton Dubois MD.  [facility] 01/30/2018, 1:28 PM

## 2018-01-30 NOTE — Progress Notes (Signed)
    Subjective: 3 BMs total since admission. Dark green, non-watery. Consistency more soft. Feels better since admission. No N/V. Tolerating diet. Wants to go home.   Objective: Vital signs in last 24 hours: Temp:  [98.5 F (36.9 C)-98.8 F (37.1 C)] 98.8 F (37.1 C) (07/01 0555) Pulse Rate:  [75-89] 79 (07/01 0555) Resp:  [16-20] 16 (07/01 0555) BP: (140-183)/(79-107) 168/100 (07/01 0555) SpO2:  [98 %-100 %] 100 % (07/01 0555) Last BM Date: 01/29/18 General:   Alert and oriented, pleasant Abdomen:  Bowel sounds present, soft, non-tender, non-distended. No HSM or hernias noted. No rebound or guarding.  Extremities:  Without  edema. Neurologic:  Alert and  oriented x4 Psych:  Alert and cooperative. Normal mood and affect.  Intake/Output from previous day: 06/30 0701 - 07/01 0700 In: 1788.3 [P.O.:600; I.V.:1188.3] Out: -  Intake/Output this shift: No intake/output data recorded.  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 She has had intermittent nausea, vomiting, abdominal pain and recently inpatient May 2019 due to same. EGD at that time with benign appearing esophageal  stenosis s/p dilation, mild pyloric stenosis s/p dilation, mild duodenitis. Presenting this admission with acute N/V/D, which she felt was related to the crab salad she made recently.   Clinically improved from admission, tolerating diet, stool is soft, abdominal pain improved. No further N/V. Could have had self-limiting gastroenteritis, unable to exclude component of cyclical vomiting syndrome in setting of chronic marijuana. Most concerning is her documented weight loss in setting of known celiac artery compression syndrome. As symptoms resolved currently, appropriate for discharge home but will arrange outpatient Vascular appointment for further evaluation due to constellation of symptoms. I have asked her to call our office if any recurrent symptoms prior to this appointment   Plan: Continue soft diet PPI BID Zofran prn nausea Outpatient vascular appt to be arranged Follow-up with [facility] as outpatient Cirrhosis imaging due again in Aug 2019 Refer to endocrinology as well due to history of abnormal cortisol last admission (previously recommended and will arrange) Stable for discharge with close outpatient follow-up   Annitta Needs, PhD, ANP-BC [facility]    LOS: 1 day    01/30/2018, 7:06 AM

## 2018-01-30 NOTE — Progress Notes (Signed)
Patient discharged home with personal belongings. IV removed and site intact. Patient sent home with prescriptions and midline removed per MD order.

## 2018-01-31 ENCOUNTER — Telehealth: Payer: Self-pay | Admitting: Gastroenterology

## 2018-01-31 DIAGNOSIS — R634 Abnormal weight loss: Secondary | ICD-10-CM

## 2018-01-31 NOTE — Telephone Encounter (Signed)
Referral has been sent.

## 2018-01-31 NOTE — Telephone Encounter (Signed)
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 CTA last year as well. She has had progressive weight loss, intermittent N/V/D.

## 2018-02-01 ENCOUNTER — Other Ambulatory Visit: Payer: Self-pay

## 2018-02-06 ENCOUNTER — Telehealth: Payer: Self-pay | Admitting: Internal Medicine

## 2018-02-06 NOTE — Telephone Encounter (Signed)
RECALL FOR ULTRASOUND 

## 2018-02-06 NOTE — Telephone Encounter (Signed)
Letter mailed

## 2018-02-08 ENCOUNTER — Encounter (HOSPITAL_COMMUNITY): Payer: Self-pay | Admitting: Emergency Medicine

## 2018-02-08 ENCOUNTER — Emergency Department (HOSPITAL_COMMUNITY)
Admission: EM | Admit: 2018-02-08 | Discharge: 2018-02-08 | Disposition: A | Discharge status: Home / Self Care | Payer: Medicare Other | Attending: Emergency Medicine | Admitting: Emergency Medicine

## 2018-02-08 ENCOUNTER — Other Ambulatory Visit: Payer: Self-pay

## 2018-02-08 DIAGNOSIS — R1013 Epigastric pain: Secondary | ICD-10-CM | POA: Insufficient documentation

## 2018-02-08 DIAGNOSIS — R112 Nausea with vomiting, unspecified: Secondary | ICD-10-CM

## 2018-02-08 DIAGNOSIS — I10 Essential (primary) hypertension: Secondary | ICD-10-CM | POA: Diagnosis not present

## 2018-02-08 DIAGNOSIS — F17210 Nicotine dependence, cigarettes, uncomplicated: Secondary | ICD-10-CM | POA: Diagnosis not present

## 2018-02-08 DIAGNOSIS — Z96652 Presence of left artificial knee joint: Secondary | ICD-10-CM | POA: Diagnosis not present

## 2018-02-08 DIAGNOSIS — Z79899 Other long term (current) drug therapy: Secondary | ICD-10-CM | POA: Insufficient documentation

## 2018-02-08 LAB — COMPREHENSIVE METABOLIC PANEL
ALT: 13 U/L (ref 0–44)
AST: 16 U/L (ref 15–41)
Albumin: 4.6 g/dL (ref 3.5–5.0)
Alkaline Phosphatase: 51 U/L (ref 38–126)
Anion gap: 10 (ref 5–15)
BUN: 6 mg/dL — ABNORMAL LOW (ref 8–23)
CO2: 18 mmol/L — ABNORMAL LOW (ref 22–32)
Calcium: 10.3 mg/dL (ref 8.9–10.3)
Chloride: 100 mmol/L (ref 98–111)
Creatinine, Ser: 0.91 mg/dL (ref 0.44–1.00)
GFR calc Af Amer: >60 mL/min (ref >60)
GFR calc non Af Amer: >60 mL/min (ref >60)
Glucose, Bld: 111 mg/dL — ABNORMAL HIGH (ref 70–99)
Potassium: 3.2 mmol/L — ABNORMAL LOW (ref 3.5–5.1)
Sodium: 128 mmol/L — ABNORMAL LOW (ref 135–145)
Total Bilirubin: 1.5 mg/dL — ABNORMAL HIGH (ref 0.3–1.2)
Total Protein: 8.5 g/dL — ABNORMAL HIGH (ref 6.5–8.1)

## 2018-02-08 LAB — CBC WITH DIFFERENTIAL/PLATELET
Basophils Absolute: 0 10*3/uL (ref 0.0–0.1)
Basophils Relative: 0 %
Eosinophils Absolute: 0 10*3/uL (ref 0.0–0.7)
Eosinophils Relative: 0 %
HCT: 35.9 % — ABNORMAL LOW (ref 36.0–46.0)
Hemoglobin: 12.4 g/dL (ref 12.0–15.0)
Lymphocytes Relative: 9 %
Lymphs Abs: 0.8 10*3/uL (ref 0.7–4.0)
MCH: 31.6 pg (ref 26.0–34.0)
MCHC: 34.5 g/dL (ref 30.0–36.0)
MCV: 91.6 fL (ref 78.0–100.0)
Monocytes Absolute: 0.6 10*3/uL (ref 0.1–1.0)
Monocytes Relative: 7 %
Neutro Abs: 7.1 10*3/uL (ref 1.7–7.7)
Neutrophils Relative %: 84 %
Platelets: 279 10*3/uL (ref 150–400)
RBC: 3.92 MIL/uL (ref 3.87–5.11)
RDW: 14.2 % (ref 11.5–15.5)
WBC: 8.6 10*3/uL (ref 4.0–10.5)

## 2018-02-08 LAB — URINALYSIS, ROUTINE W REFLEX MICROSCOPIC
Bacteria, UA: NONE SEEN
Bilirubin Urine: NEGATIVE
Glucose, UA: 150 mg/dL — AB
Hgb urine dipstick: NEGATIVE
Ketones, ur: NEGATIVE mg/dL
Leukocytes, UA: NEGATIVE
Nitrite: NEGATIVE
Protein, ur: 30 mg/dL — AB
Specific Gravity, Urine: 1.008 (ref 1.005–1.030)
pH: 7 (ref 5.0–8.0)

## 2018-02-08 LAB — LIPASE, BLOOD: Lipase: 65 U/L — ABNORMAL HIGH (ref 11–51)

## 2018-02-08 MED ORDER — LACTATED RINGERS IV BOLUS
1000.0000 mL | Freq: Once | INTRAVENOUS | Status: AC
Start: 2018-02-08 — End: 2018-02-08
  Administered 2018-02-08: 1000 mL via INTRAVENOUS

## 2018-02-08 MED ORDER — TRAMADOL HCL 50 MG PO TABS: 50.0000 mg | ORAL_TABLET | Freq: Four times a day (QID) | ORAL | 0 refills | Status: DC | PRN

## 2018-02-08 MED ORDER — HALOPERIDOL LACTATE 5 MG/ML IJ SOLN
2.0000 mg | Freq: Once | INTRAMUSCULAR | Status: AC
  Administered 2018-02-08: 2 mg via INTRAVENOUS
  Filled 2018-02-08: qty 1

## 2018-02-08 MED ORDER — HYDROMORPHONE HCL 1 MG/ML IJ SOLN
1.0000 mg | Freq: Once | INTRAMUSCULAR | Status: AC
Start: 2018-02-08 — End: 2018-02-08
  Administered 2018-02-08: 1 mg via INTRAVENOUS
  Filled 2018-02-08: qty 1

## 2018-02-08 NOTE — ED Provider Notes (Signed)
[facility] EMERGENCY DEPARTMENT Provider Note   CSN: [id number] Arrival date & time: 02/08/18  1121     History   Chief Complaint Chief Complaint  Patient presents with  . Abdominal Pain    HPI Colleen Lee is a 62 y.o. female.  HPI   62 year old female with abdominal pain and nausea/vomiting.  Pain is in the epigastrium.  Waxes and wanes without appreciable exacerbating relieving factors.  Associated with multiple episodes of nausea/vomiting.  She states that she has not been able to keep anything down for the past day.  Also some loose stools.  She has a history of recurrent abdominal pain and nausea/vomiting.  States that current symptoms feel similar to prior exacerbations.  She has had to be admitted for symptom control previously.  Previous normal gastric emptying study.  Past Medical History:  Diagnosis Date  . Asthma   . Chronic abdominal pain   . Chronic back pain   . Chronic constipation   . Cirrhosis ([place])    Metavir score F4 on elastography 2015  . Cyclical vomiting   . Fibroids   . Fibromyalgia   . GERD (gastroesophageal reflux disease)   . H. pylori infection 2014   treated with pylera, had to be treated again as it was not eradicated. Urea breath test then negative after subsequent treatment.   . [name]    HCV RNA positive 09/2012  . Hypertension   . Marijuana use   . Nausea and vomiting    chronic, recurrent  . PONV (postoperative nausea and vomiting)    pt thinks maybe once she had N&V  . Sciatica of left side     Patient Active Problem List   Diagnosis Date Noted  . Dehydration   . Intractable cyclical vomiting 32/44/0102  . Pyloric stenosis in adult   . Thrombocytopenia ([place]) 12/20/2017  . Leukocytosis   . Malnutrition of moderate degree 12/17/2017  . Acute renal failure (ARF) ([place]) 12/15/2017  . Chronic abdominal pain 12/15/2017  . Gastroenteritis 06/14/2016  . Nausea with vomiting 06/10/2016  . Uterine enlargement 06/09/2016  .  Intractable nausea and vomiting 06/08/2016  . Acute infective gastroenteritis 06/08/2016  . Diarrhea 06/08/2016  . Essential hypertension 06/08/2016  . GERD (gastroesophageal reflux disease) 06/08/2016  . Abdominal pain   . Endometrial polyp 04/15/2016  . PMB (postmenopausal bleeding) 02/27/2016  . Alcoholic cirrhosis of liver without ascites ([place])   . Asthma 01/21/2016  . Hepatic cirrhosis ([place]) 09/22/2015  . Arthritis of knee, degenerative 02/05/2015  . History of Helicobacter pylori infection 10/30/2014  . Chronic [name] with cirrhosis ([place]) 04/11/2014  . De Quervain's disease (radial styloid tenosynovitis) 12/25/2013  . Anorexia 11/21/2012  . FH: colon cancer 11/21/2012  . Early satiety 10/25/2012  . Bowel habit changes 10/25/2012  . Abdominal pain, epigastric 10/25/2012  . Abdominal bloating 10/25/2012  . Constipation 10/25/2012  . Abnormal weight loss 10/25/2012  . Chronic viral [name] ([place]) 10/25/2012  . Radicular pain of left lower extremity 09/28/2012  . Back pain 09/28/2012  . Sciatica 08/10/2011  . S/P arthroscopy of left knee 08/10/2011  . Tibial plateau fracture 08/10/2011  . Pain in joint, lower leg 02/12/2011  . Stiffness of joint, not elsewhere classified, lower leg 02/12/2011  . 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 Meniscus, medial, derangement 12/29/2010  . CLOSED FRACTURE OF UPPER END OF TIBIA 08/12/2010    Past Surgical History:  Procedure Laterality Date  . BALLOON DILATION  12/20/2017   Procedure:  BALLOON DILATION;  Surgeon: Danie Binder, MD;  Location: [facility] END[facility] SU[facility];  Service: Endoscopy;;  pyloric dilation  . BIOPSY  12/20/2017   Procedure: BIOPSY;  Surgeon: Danie Binder, MD;  Location: [facility];  Service: Endoscopy;;  duodenal and gastric biopsy  . COLONOSCOPY  01/18/2008   [id number]:[id number] rectum.  Long redundant colon, a diminutive sigmoid polyp status post cold biopsy removed. Hyperplastic polyp. Repeat colonoscopy June 2014 due to  family history of colon cancer  . COLONOSCOPY WITH ESOPHAGOGASTRODUODENOSCOPY (EGD) N/A 11/02/2012   DTO:[id number] gastric mucosa of doubtful, +H.pylori. Incomplete colonoscopy due to patient unable to tolerate exam, proximal colon seen. Patient refused ACBE.  Marland Kitchen COLONOSCOPY WITH PROPOFOL N/A 03/08/2013   Dr. Gala Romney: colonic polyp-removed as scribed above. Internal Hemorrhoids. Pathology did not reveal any colonic tissue, only mucus. SURVEILLANCE DUE Aug 2019  . COLONOSCOPY WITH PROPOFOL N/A 01/19/2018   Dr. Gala Romney: 6 mm cecal inflammatoy polyp, otherwise normal., Surveilance in 5 year s  . ESOPHAGEAL DILATION  12/20/2017   Procedure: ESOPHAGEAL DILATION;  Surgeon: Danie Binder, MD;  Location: [facility];  Service: Endoscopy;;  . ESOPHAGOGASTRODUODENOSCOPY  01/18/2008   RMR: Normal esophagus, normal  stomach  . ESOPHAGOGASTRODUODENOSCOPY (EGD) WITH PROPOFOL N/A 02/09/2016   Normal esophagus, small hiatal hernia, portal hypertensive gastropathy, normal second portion of duodenum. Due in July 2019  . ESOPHAGOGASTRODUODENOSCOPY (EGD) WITH PROPOFOL N/A 12/20/2017   Dr. Oneida Alar: benign appearing esophageal stenosis s/p dilation, mild pyloric stenosis s/p biopsy and dilation, mild duodenitis.   Marland Kitchen HYSTEROSCOPY  05/11/2016   Procedure: HYSTEROSCOPY;  Surgeon: Jonnie Kind, MD;  Location: [facility] ORS;  Service: Gynecology;;  . KNEE SURGERY     left knee  . MULTIPLE EXTRACTIONS WITH ALVEOLOPLASTY N/A 10/15/2013   Procedure: MULTIPLE EXTRACTION WITH ALVEOLOPLASTY;  Surgeon: Gae Bon, DDS;  Location: [place];  Service: Oral Surgery;  Laterality: N/A;  . POLYPECTOMY N/A 03/08/2013   Procedure: POLYPECTOMY;  Surgeon: Daneil Dolin, MD;  Location: [facility];  Service: Endoscopy;  Laterality: N/A;  . POLYPECTOMY N/A 05/11/2016   Procedure: REMOVAL OF ENDOMETRIAL POLYP;  Surgeon: Jonnie Kind, MD;  Location: [facility] OR[facility];  Service: Gynecology;  Laterality: N/A;  . TOTAL KNEE ARTHROPLASTY Left 02/05/2015   Procedure: LEFT  TOTAL KNEE ARTHROPLASTY;  Surgeon: Carole Civil, MD;  Location: [facility] OR[facility];  Service: Orthopedics;  Laterality: Left;     OB History    Gravida      Para      Term      Preterm      AB      Living  1     SAB      TAB      Ectopic      Multiple      Live Births               Home Medications    Prior to Admission medications   Medication Sig Start Date End Date Taking? Authorizing Provider  budesonide-formoterol (SYMBICORT) 160-4.5 MCG/ACT inhaler Inhale 2 puffs into the lungs 2 (two) times daily.   Yes [provider]  Cyanocobalamin (VITAMIN B 12 PO) Take 1 tablet by mouth daily.   Yes [provider]  DULoxetine (CYMBALTA) 30 MG capsule Take 1 capsule by mouth 2 (two) times daily. 11/02/17  Yes [provider]  furosemide (LASIX) 20 MG tablet Take 1 tablet (20 mg total) by mouth daily. 01/31/18  Yes Barton Dubois, MD  hydrALAZINE (APRESOLINE)  50 MG tablet Take 1 tablet (50 mg total) by mouth every 8 (eight) hours. 01/30/18  Yes Barton Dubois, MD  HYDROcodone-acetaminophen (NORCO/VICODIN) 5-325 MG tablet Take 1 tablet by mouth as needed for moderate pain.  11/02/17  Yes [provider]  hydrOXYzine (ATARAX/VISTARIL) 10 MG tablet  11/30/17  Yes [provider]  labetalol (NORMODYNE) 100 MG tablet Take 2 tablets (200 mg total) by mouth 2 (two) times daily. 01/30/18  Yes Barton Dubois, MD  losartan (COZAAR) 100 MG tablet Take 1 tablet (100 mg total) by mouth daily. 01/31/18  Yes Barton Dubois, MD  megestrol (MEGACE) 40 MG tablet Take 40 mg by mouth daily.   Yes [provider]  metoCLOPramide (REGLAN) 5 MG tablet Take 1 tablet (5 mg total) by mouth 3 (three) times daily before meals. 01/30/18  Yes Barton Dubois, MD  ondansetron (ZOFRAN ODT) 4 MG disintegrating tablet Take 1 tablet (4 mg total) by mouth 3 (three) times daily. '4mg'$  ODT q4 hours prn nausea/vomit 12/22/17  Yes Memon, Jolaine Artist, MD  pantoprazole (PROTONIX) 40 MG  tablet Take 1 tablet (40 mg total) by mouth 2 (two) times daily before a meal. 12/22/17  Yes Memon, Jolaine Artist, MD  Plecanatide (TRULANCE) 3 MG TABS Take 1 tablet by mouth daily. 05/09/17  Yes Mahala Menghini, PA-C  GAVILYTE-G 236 g solution as needed.  11/16/17   [provider]  traMADol (ULTRAM) 50 MG tablet Take 1 tablet (50 mg total) by mouth every 6 (six) hours as needed. 02/08/18   Virgel Manifold, MD    Family History Family History  Problem Relation Age of Onset  . Colon cancer Brother 32          . Multiple myeloma Brother   . Liver cancer Sister   . Prostate cancer Brother   . Pancreatic cancer Brother   . Cancer Mother        breast  . Asthma Mother     Social History Social History   Tobacco Use  . Smoking status: Current Every Day Smoker    Packs/day: 0.50    Years: 40.00    Pack years: 20.00    Types: Cigarettes  . Smokeless tobacco: Never Used  . Tobacco comment: Smokes one pack of cigarettes daily  Substance Use Topics  . Alcohol use: No    Alcohol/week: 0.0 oz  . Drug use: No    Types: Marijuana    Comment: history of marijuana in past     Allergies   Penicillins   Review of Systems Review of Systems  All systems reviewed and negative, other than as noted in HPI.  Physical Exam Updated Vital Signs BP (!) 162/95 (BP Location: Right Arm)   Pulse 78   Temp 99.1 F (37.3 C) (Oral)   Resp 18   Ht '5\' 6"'$  (1.676 m)   Wt 74.4 kg (164 lb)   SpO2 100%   BMI 26.47 kg/m   Physical Exam  Constitutional: She appears well-developed and well-nourished. No distress.  HENT:  Head: Normocephalic and atraumatic.  Eyes: Conjunctivae are normal. Right eye exhibits no discharge. Left eye exhibits no discharge.  Neck: Neck supple.  Cardiovascular: Normal rate, regular rhythm and normal heart sounds. Exam reveals no gallop and no friction rub.  No murmur heard. Pulmonary/Chest: Effort normal and breath sounds normal. No respiratory distress.    Abdominal: Soft. She exhibits no distension. There is tenderness.  Epigastric tenderness without rebound or guarding.  No distention.  Musculoskeletal: She exhibits  no edema or tenderness.  Neurological: She is alert.  Skin: Skin is warm and dry.  Psychiatric: She has a normal mood and affect. Her behavior is normal. Thought content normal.  Nursing note and vitals reviewed.    ED Treatments / Results  Labs (all labs ordered are listed, but only abnormal results are displayed) Labs Reviewed  CBC WITH DIFFERENTIAL/PLATELET - Abnormal; Notable for the following components:      Result Value   HCT 35.9 (*)    All other components within normal limits  COMPREHENSIVE METABOLIC PANEL - Abnormal; Notable for the following components:   Sodium 128 (*)    Potassium 3.2 (*)    CO2 18 (*)    Glucose, Bld 111 (*)    BUN 6 (*)    Total Protein 8.5 (*)    Total Bilirubin 1.5 (*)    All other components within normal limits  LIPASE, BLOOD - Abnormal; Notable for the following components:   Lipase 65 (*)    All other components within normal limits  URINALYSIS, ROUTINE W REFLEX MICROSCOPIC - Abnormal; Notable for the following components:   Color, Urine STRAW (*)    Glucose, UA 150 (*)    Protein, ur 30 (*)    All other components within normal limits    EKG None  Radiology No results found.  Procedures Procedures (including critical care time)  Medications Ordered in ED Medications  lactated ringers bolus 1,000 mL (1,000 mLs Intravenous New Bag/Given 02/08/18 1419)  haloperidol lactate (HALDOL) injection 2 mg (2 mg Intravenous Given 02/08/18 1416)  HYDROmorphone (DILAUDID) injection 1 mg (1 mg Intravenous Given 02/08/18 1419)     Initial Impression / Assessment and Plan / ED Course  I have reviewed the triage vital signs and the nursing notes.  Pertinent labs & imaging results that were available during my care of the patient were reviewed by me and considered in my medical  decision making (see chart for details).    62 year old female with abdominal pain and vomiting.  Suspect exacerbation of her chronic underlying condition.  She states that symptoms are similar.  She is afebrile.  No peritonitis on exam.  Leukocytosis.  Hyponatremia noted, but this appears to be chronic.  She was treated symptomatically with improvement. I doubt emergent condition. Continued symptomatic tx. It has been determined that no acute conditions requiring further emergency intervention are present at this time. The patient has been advised of the diagnosis and plan. I reviewed any labs and imaging including any potential incidental findings. We have discussed signs and symptoms that warrant return to the ED and they are listed in the discharge instructions.    Final Clinical Impressions(s) / ED Diagnoses   Final diagnoses:  Epigastric pain  Nausea and vomiting, intractability of vomiting not specified, unspecified vomiting type    ED Discharge Orders        Ordered    traMADol (ULTRAM) 50 MG tablet  Every 6 hours PRN     02/08/18 1532       Virgel Manifold, MD 02/08/18 1537

## 2018-02-08 NOTE — ED Triage Notes (Signed)
Returns, discharge last week with abdominal pain, pt continue to have pain, nausea and vomiting and diarrhea.

## 2018-02-22 DIAGNOSIS — M13862 Other specified arthritis, left knee: Secondary | ICD-10-CM | POA: Diagnosis not present

## 2018-02-22 DIAGNOSIS — M461 Sacroiliitis, not elsewhere classified: Secondary | ICD-10-CM | POA: Diagnosis not present

## 2018-02-22 DIAGNOSIS — Z79891 Long term (current) use of opiate analgesic: Secondary | ICD-10-CM | POA: Diagnosis not present

## 2018-02-22 DIAGNOSIS — G894 Chronic pain syndrome: Secondary | ICD-10-CM | POA: Diagnosis not present

## 2018-02-23 ENCOUNTER — Ambulatory Visit: Payer: Medicare HMO | Admitting: Nurse Practitioner

## 2018-03-03 NOTE — Addendum Note (Signed)
Addended by: Clerance Lav on: 03/03/2018 01:22 PM   Modules accepted: Orders

## 2018-03-07 ENCOUNTER — Other Ambulatory Visit: Payer: Self-pay

## 2018-03-07 ENCOUNTER — Encounter: Payer: Self-pay | Admitting: Vascular Surgery

## 2018-03-07 ENCOUNTER — Ambulatory Visit (INDEPENDENT_AMBULATORY_CARE_PROVIDER_SITE_OTHER): Payer: Medicare Other | Admitting: Vascular Surgery

## 2018-03-07 VITALS — BP 126/74 | HR 76 | Temp 99.4°F | Resp 20 | Ht 66.0 in | Wt 170.0 lb

## 2018-03-07 DIAGNOSIS — R1013 Epigastric pain: Secondary | ICD-10-CM | POA: Diagnosis not present

## 2018-03-07 NOTE — Progress Notes (Signed)
[facility] [place]  Patient name: Colleen Lee MRN: [id number] DOB: 06/04/1956 Sex: female  REASON FOR CONSULT: Evaluation of abdominal pain and duplex suggesting median arcuate ligament syndrome  HPI: Colleen Lee is a 62 y.o. female, who is here today for evaluation.  She reports that she has been having 2-year history of abdominal pain.  He reports that this can last for several hours and then resolves for 3 to 4 weeks before it recurs.  She does not notice any association with eating.  She thinks that this potentially could be Crohn's disease.  Does have a history of cirrhosis and chronic abdominal pain.  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  She denies weight loss.  She reports that she normally weighs 145 to 160 pounds and had gained up to 190 but is lost to 170 and this is been stable for quite some time.  Past Medical History:  Diagnosis Date  . Asthma   . Chronic abdominal pain   . Chronic back pain   . Chronic constipation   . Cirrhosis ([place])    Metavir score F4 on elastography 2015  . Cyclical vomiting   . Fibroids   . Fibromyalgia   . GERD (gastroesophageal reflux disease)   . H. pylori infection 2014   treated with pylera, had to be treated again as it was not eradicated. Urea breath test then negative after subsequent treatment.   . [name]    HCV RNA positive 09/2012  . Hypertension   . Marijuana use   . Nausea and vomiting    chronic, recurrent  . PONV (postoperative nausea and vomiting)    pt thinks maybe once she had N&V  . Sciatica of left side     Family History  Problem Relation Age of Onset  . Colon cancer Brother 109          . Multiple myeloma Brother   . Liver cancer Sister   . Prostate cancer Brother   . Pancreatic cancer Brother   . Cancer Mother        breast  . Asthma Mother      SOCIAL HISTORY: Social History   Socioeconomic History  . Marital status: Single    Spouse name: Not on file  . Number of children: 0  . Years of education: Not on file  . Highest education level: Not on file  Occupational History  . Occupation: umemployed   Social Needs  . Financial resource strain: Not on file  . Food insecurity:    Worry: Not on file    Inability: Not on file  . Transportation needs:    Medical: Not on file    Non-medical: Not on file  Tobacco Use  . Smoking status: Current Every Day Smoker    Packs/day: 0.50    Years: 40.00    Pack years: 20.00    Types: Cigarettes  . Smokeless tobacco: Never Used  . Tobacco comment: Smokes one pack of cigarettes daily  Substance and Sexual Activity  . Alcohol use: No    Alcohol/week: 0.0 oz  . Drug use: No    Types: Marijuana    Comment: history of marijuana in past  . Sexual activity: Never    Birth control/protection: None  Lifestyle  . Physical activity:    Days per week: Not on file    Minutes per session: Not on  file  . Stress: Not on file  Relationships  . Social connections:    Talks on phone: Not on file    Gets together: Not on file    Attends religious service: Not on file    Active member of club or organization: Not on file    Attends meetings of clubs or organizations: Not on file    Relationship status: Not on file  . Intimate partner violence:    Fear of current or ex partner: Not on file    Emotionally abused: Not on file    Physically abused: Not on file    Forced sexual activity: Not on file  Other Topics Concern  . Not on file  Social History Narrative  . Not on file    Allergies  Allergen Reactions  . Penicillins Rash    Has patient had a PCN reaction causing immediate rash, facial/tongue/throat swelling, SOB or lightheadedness with hypotension: Yes Has patient had a PCN reaction causing severe rash involving mucus membranes or skin necrosis: No Has patient had a PCN  reaction that required hospitalization No Has patient had a PCN reaction occurring within the last 10 years: No If all of the above answers are "NO", then may proceed with Cephalosporin use.     Current Outpatient Medications  Medication Sig Dispense Refill  . budesonide-formoterol (SYMBICORT) 160-4.5 MCG/ACT inhaler Inhale 2 puffs into the lungs 2 (two) times daily.    . Cyanocobalamin (VITAMIN B 12 PO) Take 1 tablet by mouth daily.    . DULoxetine (CYMBALTA) 30 MG capsule Take 1 capsule by mouth 2 (two) times daily.    . furosemide (LASIX) 20 MG tablet Take 1 tablet (20 mg total) by mouth daily. 30 tablet 1  . GAVILYTE-G 236 g solution as needed.     . hydrALAZINE (APRESOLINE) 50 MG tablet Take 1 tablet (50 mg total) by mouth every 8 (eight) hours. 90 tablet 1  . HYDROcodone-acetaminophen (NORCO/VICODIN) 5-325 MG tablet Take 1 tablet by mouth as needed for moderate pain.     . hydrOXYzine (ATARAX/VISTARIL) 10 MG tablet     . labetalol (NORMODYNE) 100 MG tablet Take 2 tablets (200 mg total) by mouth 2 (two) times daily. 120 tablet 1  . losartan (COZAAR) 100 MG tablet Take 1 tablet (100 mg total) by mouth daily. 30 tablet 1  . megestrol (MEGACE) 40 MG tablet Take 40 mg by mouth daily.    . metoCLOPramide (REGLAN) 5 MG tablet Take 1 tablet (5 mg total) by mouth 3 (three) times daily before meals. 60 tablet 0  . ondansetron (ZOFRAN ODT) 4 MG disintegrating tablet Take 1 tablet (4 mg total) by mouth 3 (three) times daily. 30m ODT q4 hours prn nausea/vomit 60 tablet 0  . pantoprazole (PROTONIX) 40 MG tablet Take 1 tablet (40 mg total) by mouth 2 (two) times daily before a meal. 60 tablet 0  . Plecanatide (TRULANCE) 3 MG TABS Take 1 tablet by mouth daily. 30 tablet 5  . traMADol (ULTRAM) 50 MG tablet Take 1 tablet (50 mg total) by mouth every 6 (six) hours as needed. 8 tablet 0   No current facility-administered medications for this visit.     REVIEW OF SYSTEMS:  [X] denotes positive finding,  [ ] denotes negative finding Cardiac  Comments:  Chest pain or chest pressure:    Shortness of breath upon exertion: x   Short of breath when lying flat: x   Irregular heart rhythm:  Vascular    Pain in calf, thigh, or hip brought on by ambulation: x   Pain in feet at night that wakes you up from your sleep:  x   Blood clot in your veins:    Leg swelling:  x       Pulmonary    Oxygen at home:    Productive cough:     Wheezing:  x       Neurologic    Sudden weakness in arms or legs:     Sudden numbness in arms or legs:  x   Sudden onset of difficulty speaking or slurred speech:    Temporary loss of vision in one eye:     Problems with dizziness:  x       Gastrointestinal    Blood in stool:     Vomited blood:         Genitourinary    Burning when urinating:     Blood in urine:        Psychiatric    Major depression:         Hematologic    Bleeding problems:    Problems with blood clotting too easily:        Skin    Rashes or ulcers:        Constitutional    Fever or chills:      PHYSICAL EXAM: Vitals:   03/07/18 1445  BP: 126/74  Pulse: 76  Resp: 20  Temp: 99.4 F (37.4 C)  TempSrc: Oral  SpO2: 100%  Weight: 170 lb (77.1 kg)  Height: 5' 6" (1.676 m)    GENERAL: The patient is a well-nourished female, in no acute distress. The vital signs are documented above. CARDIOVASCULAR: 2+ radial and 2+ dorsalis pedis pulses bilaterally PULMONARY: There is good air exchange  ABDOMEN: Soft and non-tender no masses and no tenderness.  No abdominal bruits noted MUSCULOSKELETAL: There are no major deformities or cyanosis. NEUROLOGIC: No focal weakness or paresthesias are detected. SKIN: There are no ulcers or rashes noted. PSYCHIATRIC: The patient has a normal affect.  DATA:  I did review her abdominal duplex.  Also reviewed CT scan from 2018 in 2014.  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  I do not feel that this is any association with the symptoms that she is described.  Her episodic nature of this and would not correspond.  I did explain the magnitude of surgery for correction of this with laparotomy and division of the crus the diaphragm and potential intervention in the celiac artery and certainly would reserve this for clear-cut indications.  She was reassured with this discussion will see us again on an as-needed basis   Todd F. Early, MD FACS [facility] [place] Office Tel [phone] Pager [phone]    

## 2018-03-13 ENCOUNTER — Other Ambulatory Visit: Payer: Self-pay | Admitting: Gastroenterology

## 2018-03-14 ENCOUNTER — Telehealth: Payer: Self-pay

## 2018-03-14 NOTE — Telephone Encounter (Signed)
PA started for Trulance through [email or web address]. Waiting on approval or denial.

## 2018-03-14 NOTE — Telephone Encounter (Signed)
Received an approval letter from [place]. pts medication Trulance 3 mg is approved through 08/01/1918.

## 2018-03-16 ENCOUNTER — Other Ambulatory Visit: Payer: Self-pay

## 2018-03-16 NOTE — Patient Outreach (Signed)
[place] [facility]) Care Management  03/16/2018  ALYIA LACERTE 1956-04-22 [id number]    1st outreach to the patient for initial assessment. Gentleman answered the phone ans stated that she was not at home. HIPAA compliant voicemail left with contact information.  Plan: RN Health Coach will send letter. RN Health Coach will make another attempt to the patient within four business days.  Lazaro Arms RN, BSN, [place] Direct Dial:  [phone]  Fax: [phone]

## 2018-03-22 ENCOUNTER — Other Ambulatory Visit: Payer: Self-pay

## 2018-03-22 DIAGNOSIS — M13862 Other specified arthritis, left knee: Secondary | ICD-10-CM | POA: Diagnosis not present

## 2018-03-22 DIAGNOSIS — G894 Chronic pain syndrome: Secondary | ICD-10-CM | POA: Diagnosis not present

## 2018-03-22 DIAGNOSIS — M461 Sacroiliitis, not elsewhere classified: Secondary | ICD-10-CM | POA: Diagnosis not present

## 2018-03-22 DIAGNOSIS — Z79891 Long term (current) use of opiate analgesic: Secondary | ICD-10-CM | POA: Diagnosis not present

## 2018-03-22 NOTE — Patient Outreach (Signed)
[place] [facility]) Care Management  03/22/2018  Colleen Lee [birth year] [id number]    2nd outreach attempt to the patient for initial assessment. Gentleman answered the phone and stated that the patient was not at home.  Left a HIPAA compliant voicemail with contact information.  Plan: RN Health Coach will make another outreach attempt to the patient within the next four business days.  Lazaro Arms RN, BSN, [place] Direct Dial:  [phone]  Fax: [phone]

## 2018-03-27 ENCOUNTER — Other Ambulatory Visit: Payer: Self-pay

## 2018-03-27 NOTE — Patient Outreach (Signed)
[place] [facility]) Care Management  03/27/2018  Colleen Lee 1955-09-15 [id number]    3rd outreach to the patient for initial assessment. No answer.  Mailbox is full and unable to leave a message.  Plan: If no response to calls and letter in ten business days [facility] will proceed with case closure.   Lazaro Arms RN, BSN, [place] Direct Dial:  [phone]  Fax: [phone]

## 2018-03-30 ENCOUNTER — Other Ambulatory Visit: Payer: Self-pay

## 2018-03-30 NOTE — Patient Outreach (Signed)
[place] [facility]) Care Management  03/30/2018  Colleen Lee 11-Jun-1956 [id number]    Multiple attempts to establish contact with patient without success. No response from calls or letter mailed to patient.   Plan: Case is being closed at this time.  Lazaro Arms RN, BSN, [place] Direct Dial:  [phone]  Fax: [phone]

## 2018-03-31 ENCOUNTER — Telehealth: Payer: Self-pay | Admitting: Gastroenterology

## 2018-03-31 ENCOUNTER — Ambulatory Visit: Payer: Medicare HMO | Admitting: Gastroenterology

## 2018-03-31 ENCOUNTER — Encounter: Payer: Self-pay | Admitting: Gastroenterology

## 2018-03-31 NOTE — Telephone Encounter (Signed)
PATIENT WAS A NO SHOW AND LETTER SENT  °

## 2018-04-13 ENCOUNTER — Encounter: Payer: Self-pay | Admitting: *Deleted

## 2018-04-13 ENCOUNTER — Encounter: Payer: Self-pay | Admitting: Gastroenterology

## 2018-04-13 ENCOUNTER — Ambulatory Visit (INDEPENDENT_AMBULATORY_CARE_PROVIDER_SITE_OTHER): Payer: Medicare Other | Admitting: Gastroenterology

## 2018-04-13 VITALS — BP 104/61 | HR 74 | Temp 99.4°F | Ht 66.0 in | Wt 170.0 lb

## 2018-04-13 DIAGNOSIS — Z800 Family history of malignant neoplasm of digestive organs: Secondary | ICD-10-CM | POA: Diagnosis not present

## 2018-04-13 DIAGNOSIS — K7460 Unspecified cirrhosis of liver: Secondary | ICD-10-CM | POA: Diagnosis not present

## 2018-04-13 DIAGNOSIS — K5900 Constipation, unspecified: Secondary | ICD-10-CM | POA: Diagnosis not present

## 2018-04-13 NOTE — Patient Instructions (Signed)
I am so glad you are doing well!  We are arranging a routine ultrasound of your liver.  Please call me if any abdominal pain that worsens, unable to tolerate your diet or difficulty with keeping liquids down.   We will see you in 6 months!  I enjoyed seeing you again today! As you know, I value our relationship and want to provide genuine, compassionate, and quality care. I welcome your feedback. If you receive a survey regarding your visit,  I greatly appreciate you taking time to fill this out. See you next time!  Annitta Needs, PhD, ANP-BC [facility]

## 2018-04-13 NOTE — Assessment & Plan Note (Signed)
Colonoscopy up-to-date. 5 year surveillance.

## 2018-04-13 NOTE — Progress Notes (Signed)
Referring Provider: Rosita Fire, MD Primary Care Physician:  Rosita Fire, MD Primary GI: Dr. Gala Romney  Chief Complaint  Patient presents with  . Abdominal Pain    occ lower abd    HPI:   Colleen Lee is a 62 y.o. 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 EGD several months ago with benign appearing esophageal stenosis s/p dilation, mild pyloric stenosis s/p dilation, mild duodenitis. She was inpatient again July 2019 with what was felt to be gastroenteritis, but her weight loss was concerning. Vascular appt was completed as outpatient. Vascular felt there was no association between median arcuate ligament syndrome and the symptoms she recently had. Return as needed.   Constipation: occasional lower abdominal discomfort that is relieved by BM. Trulance is working well.   Doing well since leaving hospital. States the medication she was put on from the hospital has helped a lot. Reglan as needed. Protonix BID.     Past Medical History:  Diagnosis Date  . Asthma   . Chronic abdominal pain   . Chronic back pain   . Chronic constipation   . Cirrhosis ([place])    Metavir score F4 on elastography 2015  . Cyclical vomiting   . Fibroids   . Fibromyalgia   . GERD (gastroesophageal reflux disease)   . H. pylori infection 2014   treated with pylera, had to be treated again as it was not eradicated. Urea breath test then negative after subsequent treatment.   . [name]    HCV RNA positive 09/2012  . Hypertension   . Marijuana use   . Nausea and vomiting    chronic, recurrent  . PONV (postoperative nausea and vomiting)    pt  thinks maybe once she had N&V  . Sciatica of left side     Past Surgical History:  Procedure Laterality Date  . BALLOON DILATION  12/20/2017   Procedure: BALLOON DILATION;  Surgeon: Danie Binder, MD;  [facility];  Service: Endoscopy;;  pyloric dilation  . BIOPSY  12/20/2017   Procedure: BIOPSY;  Surgeon: Danie Binder, MD;  [facility];  Service: Endoscopy;;  duodenal and gastric biopsy  . COLONOSCOPY  01/18/2008   OFB:[id number] rectum.  Long redundant colon, a diminutive sigmoid polyp status post cold biopsy removed. Hyperplastic polyp. Repeat colonoscopy June 2014 due to family history of colon cancer  . COLONOSCOPY WITH ESOPHAGOGASTRODUODENOSCOPY (EGD) N/A 11/02/2012   NID:[id number] gastric mucosa of doubtful, +H.pylori. Incomplete colonoscopy due to patient unable to tolerate exam, proximal colon seen. Patient refused ACBE.  Marland Kitchen COLONOSCOPY WITH PROPOFOL N/A 03/08/2013   Dr. Gala Romney: colonic polyp-removed as scribed above. Internal Hemorrhoids. Pathology did not reveal any colonic tissue, only mucus. SURVEILLANCE DUE Aug 2019  . COLONOSCOPY WITH PROPOFOL N/A 01/19/2018   Dr. Gala Romney: 6 mm cecal inflammatoy polyp, otherwise normal., Surveilance in 5 year s  . ESOPHAGEAL DILATION  12/20/2017   Procedure: ESOPHAGEAL DILATION;  Surgeon: Danie Binder, MD;  [facility];  Service: Endoscopy;;  . ESOPHAGOGASTRODUODENOSCOPY  01/18/2008   RMR: Normal esophagus, normal  stomach  . ESOPHAGOGASTRODUODENOSCOPY (EGD) WITH PROPOFOL N/A 02/09/2016   Normal esophagus, small hiatal hernia, portal hypertensive gastropathy,  normal second portion of duodenum. Due in July 2019  . ESOPHAGOGASTRODUODENOSCOPY (EGD) WITH PROPOFOL N/A 12/20/2017   Dr. Oneida Alar: benign appearing esophageal stenosis s/p dilation, mild pyloric stenosis s/p biopsy and dilation, mild duodenitis.   Marland Kitchen HYSTEROSCOPY  05/11/2016   Procedure: HYSTEROSCOPY;  Surgeon: Jonnie Kind, MD;  [facility];  Service:  Gynecology;;  . KNEE SURGERY     left knee  . MULTIPLE EXTRACTIONS WITH ALVEOLOPLASTY N/A 10/15/2013   Procedure: MULTIPLE EXTRACTION WITH ALVEOLOPLASTY;  Surgeon: Gae Bon, DDS;  Location: [place];  Service: Oral Surgery;  Laterality: N/A;  . POLYPECTOMY N/A 03/08/2013   Procedure: POLYPECTOMY;  Surgeon: Daneil Dolin, MD;  [facility];  Service: Endoscopy;  Laterality: N/A;  . POLYPECTOMY N/A 05/11/2016   Procedure: REMOVAL OF ENDOMETRIAL POLYP;  Surgeon: Jonnie Kind, MD;  [facility];  Service: Gynecology;  Laterality: N/A;  . TOTAL KNEE ARTHROPLASTY Left 02/05/2015   Procedure: LEFT TOTAL KNEE ARTHROPLASTY;  Surgeon: Carole Civil, MD;  [facility];  Service: Orthopedics;  Laterality: Left;    Current Outpatient Medications  Medication Sig Dispense Refill  . budesonide-formoterol (SYMBICORT) 160-4.5 MCG/ACT inhaler Inhale 2 puffs into the lungs 2 (two) times daily.    . Cyanocobalamin (VITAMIN B 12 PO) Take 1 tablet by mouth daily.    . DULoxetine (CYMBALTA) 30 MG capsule Take 1 capsule by mouth 2 (two) times daily.    . furosemide (LASIX) 20 MG tablet Take 1 tablet (20 mg total) by mouth daily. 30 tablet 1  . hydrALAZINE (APRESOLINE) 50 MG tablet Take 1 tablet (50 mg total) by mouth every 8 (eight) hours. 90 tablet 1  . HYDROcodone-acetaminophen (NORCO/VICODIN) 5-325 MG tablet Take 1 tablet by mouth as needed for moderate pain.     . hydrOXYzine (ATARAX/VISTARIL) 10 MG tablet Take 10 mg by mouth daily.     Marland Kitchen labetalol (NORMODYNE) 100 MG tablet Take 2 tablets (200 mg total) by mouth 2 (two) times daily. 120 tablet 1  . losartan (COZAAR) 100 MG tablet Take 1 tablet (100 mg total) by mouth daily. 30 tablet 1  . megestrol (MEGACE) 40 MG tablet Take 40 mg by mouth daily.    . metoCLOPramide (REGLAN) 5 MG tablet Take 1 tablet (5 mg total) by mouth 3 (three) times daily before meals. (Patient taking differently: Take 5 mg by mouth as needed. ) 60 tablet 0  .  ondansetron (ZOFRAN ODT) 4 MG disintegrating tablet Take 1 tablet (4 mg total) by mouth 3 (three) times daily. '4mg'$  ODT q4 hours prn nausea/vomit 60 tablet 0  . pantoprazole (PROTONIX) 40 MG tablet Take 1 tablet (40 mg total) by mouth 2 (two) times daily before a meal. 60 tablet 0  . TRULANCE 3 MG TABS TAKE ONE TABLET BY MOUTH ONCE DAILY. 30 tablet 3  . GAVILYTE-G 236 g solution as needed.     . traMADol (ULTRAM) 50 MG tablet Take 1 tablet (50 mg total) by mouth every 6 (six) hours as needed. (Patient not taking: Reported on 04/13/2018) 8 tablet 0   No current facility-administered medications for this visit.     Allergies as of 04/13/2018 - Review Complete 04/13/2018  Allergen Reaction Noted  . Penicillins Rash     Family History  Problem Relation Age of Onset  . Colon cancer Brother 77          . Multiple myeloma Brother   . Liver cancer Sister   . Prostate cancer  Brother   . Pancreatic cancer Brother   . Cancer Mother        breast  . Asthma Mother     Social History   Socioeconomic History  . Marital status: Single    Spouse name: Not on file  . Number of children: 0  . Years of education: Not on file  . Highest education level: Not on file  Occupational History  . Occupation: umemployed   Social Needs  . Financial resource strain: Not on file  . Food insecurity:    Worry: Not on file    Inability: Not on file  . Transportation needs:    Medical: Not on file    Non-medical: Not on file  Tobacco Use  . Smoking status: Current Every Day Smoker    Packs/day: 0.50    Years: 40.00    Pack years: 20.00    Types: Cigarettes  . Smokeless tobacco: Never Used  . Tobacco comment: Smokes one pack of cigarettes daily  Substance and Sexual Activity  . Alcohol use: No    Alcohol/week: 0.0 standard drinks  . Drug use: No    Types: Marijuana    Comment: history of marijuana in past  . Sexual activity: Never    Birth control/protection: None  Lifestyle  . Physical  activity:    Days per week: Not on file    Minutes per session: Not on file  . Stress: Not on file  Relationships  . Social connections:    Talks on phone: Not on file    Gets together: Not on file    Attends religious service: Not on file    Active member of club or organization: Not on file    Attends meetings of clubs or organizations: Not on file    Relationship status: Not on file  Other Topics Concern  . Not on file  Social History Narrative  . Not on file    Review of Systems: Gen: Denies fever, chills, anorexia. Denies fatigue, weakness, weight loss.  CV: Denies chest pain, palpitations, syncope, peripheral edema, and claudication. Resp: Denies dyspnea at rest, cough, wheezing, coughing up blood, and pleurisy. GI: see HPI  Derm: Denies rash, itching, dry skin Psych: Denies depression, anxiety, memory loss, confusion. No homicidal or suicidal ideation.  Heme: Denies bruising, bleeding, and enlarged lymph nodes.  Physical Exam: BP 104/61   Pulse 74   Temp 99.4 F (37.4 C) (Oral)   Ht '5\' 6"'$  (1.676 m)   Wt 170 lb (77.1 kg)   BMI 27.44 kg/m  General:   Alert and oriented. No distress noted. Pleasant and cooperative.  Head:  Normocephalic and atraumatic. Eyes:  Conjuctiva clear without scleral icterus. Mouth:  Oral mucosa pink and moist.  Abdomen:  +BS, soft, non-tender and non-distended. No rebound or guarding. No HSM or masses noted. Msk:  Symmetrical without gross deformities. Normal posture. Extremities:  Without edema. Neurologic:  Alert and  oriented x4 Psych:  Alert and cooperative. Normal mood and affect.  Lab Results  Component Value Date   WBC 8.6 02/08/2018   HGB 12.4 02/08/2018   HCT 35.9 (L) 02/08/2018   MCV 91.6 02/08/2018   PLT 279 02/08/2018   Lab Results  Component Value Date   ALT 13 02/08/2018   AST 16 02/08/2018   ALKPHOS 51 02/08/2018   BILITOT 1.5 (H) 02/08/2018

## 2018-04-13 NOTE — Progress Notes (Signed)
cc'ed to pcp °

## 2018-04-13 NOTE — Assessment & Plan Note (Signed)
Continue Trulance once daily.

## 2018-04-13 NOTE — Assessment & Plan Note (Signed)
62 year old female recently inpatient with possible gastroenteritis, now resolved. History of ETOH, Hep C s/p treatment and achieving SVR. She is well-compensated and due for [place] now.   As of note, she was evaluated by Vascular due to known median arcuate ligament syndrome and not felt to need any further evaluation unless clinical changes.   Return in 6 months.

## 2018-04-18 ENCOUNTER — Ambulatory Visit (HOSPITAL_COMMUNITY)
Admission: RE | Admit: 2018-04-18 | Discharge: 2018-04-18 | Disposition: A | Discharge status: Home / Self Care | Payer: Medicare Other | Source: Ambulatory Visit | Attending: Gastroenterology | Admitting: Gastroenterology

## 2018-04-18 DIAGNOSIS — K838 Other specified diseases of biliary tract: Secondary | ICD-10-CM | POA: Insufficient documentation

## 2018-04-18 DIAGNOSIS — K7460 Unspecified cirrhosis of liver: Secondary | ICD-10-CM | POA: Diagnosis not present

## 2018-04-19 ENCOUNTER — Other Ambulatory Visit: Payer: Self-pay

## 2018-04-19 ENCOUNTER — Other Ambulatory Visit: Payer: Self-pay | Admitting: Neurology

## 2018-04-19 DIAGNOSIS — R17 Unspecified jaundice: Secondary | ICD-10-CM

## 2018-04-19 DIAGNOSIS — Z79891 Long term (current) use of opiate analgesic: Secondary | ICD-10-CM | POA: Diagnosis not present

## 2018-04-19 DIAGNOSIS — G894 Chronic pain syndrome: Secondary | ICD-10-CM | POA: Diagnosis not present

## 2018-04-19 DIAGNOSIS — M461 Sacroiliitis, not elsewhere classified: Secondary | ICD-10-CM | POA: Diagnosis not present

## 2018-04-19 DIAGNOSIS — M13862 Other specified arthritis, left knee: Secondary | ICD-10-CM | POA: Diagnosis not present

## 2018-04-19 DIAGNOSIS — I639 Cerebral infarction, unspecified: Secondary | ICD-10-CM

## 2018-04-19 NOTE — Progress Notes (Signed)
Mildly dilated CBD from prior evaluation. Bilirubin only marginally elevated at 1.5 in July 2019. Let's update LFTs now. Repeat US in 3 months. If progressive, recommend MRCP.

## 2018-04-25 DIAGNOSIS — M549 Dorsalgia, unspecified: Secondary | ICD-10-CM | POA: Diagnosis not present

## 2018-04-25 DIAGNOSIS — I10 Essential (primary) hypertension: Secondary | ICD-10-CM | POA: Diagnosis not present

## 2018-04-25 DIAGNOSIS — M7591 Shoulder lesion, unspecified, right shoulder: Secondary | ICD-10-CM | POA: Diagnosis not present

## 2018-04-25 DIAGNOSIS — H00011 Hordeolum externum right upper eyelid: Secondary | ICD-10-CM | POA: Diagnosis not present

## 2018-04-25 DIAGNOSIS — R17 Unspecified jaundice: Secondary | ICD-10-CM | POA: Diagnosis not present

## 2018-04-26 LAB — HEPATIC FUNCTION PANEL
AG Ratio: 1.6 (calc) (ref 1.0–2.5)
ALT: 22 U/L (ref 6–29)
AST: 20 U/L (ref 10–35)
Albumin: 4.5 g/dL (ref 3.6–5.1)
Alkaline phosphatase (APISO): 45 U/L (ref 33–130)
Bilirubin, Direct: 0.2 mg/dL (ref 0.0–0.2)
Globulin: 2.8 g/dL (calc) (ref 1.9–3.7)
Indirect Bilirubin: 0.6 mg/dL (calc) (ref 0.2–1.2)
Total Bilirubin: 0.8 mg/dL (ref 0.2–1.2)
Total Protein: 7.3 g/dL (ref 6.1–8.1)

## 2018-05-01 NOTE — Progress Notes (Signed)
HFP normalized. Keep appt for March.

## 2018-05-24 ENCOUNTER — Encounter: Payer: Self-pay | Admitting: Orthopedic Surgery

## 2018-05-24 ENCOUNTER — Ambulatory Visit (INDEPENDENT_AMBULATORY_CARE_PROVIDER_SITE_OTHER): Payer: Medicare Other | Admitting: Orthopedic Surgery

## 2018-05-24 ENCOUNTER — Ambulatory Visit (INDEPENDENT_AMBULATORY_CARE_PROVIDER_SITE_OTHER): Payer: Medicare Other

## 2018-05-24 VITALS — BP 103/61 | HR 69 | Ht 66.0 in | Wt 178.0 lb

## 2018-05-24 DIAGNOSIS — M25811 Other specified joint disorders, right shoulder: Secondary | ICD-10-CM | POA: Insufficient documentation

## 2018-05-24 DIAGNOSIS — G8929 Other chronic pain: Secondary | ICD-10-CM

## 2018-05-24 DIAGNOSIS — M25562 Pain in left knee: Secondary | ICD-10-CM

## 2018-05-24 DIAGNOSIS — M25511 Pain in right shoulder: Secondary | ICD-10-CM

## 2018-05-24 DIAGNOSIS — M7541 Impingement syndrome of right shoulder: Secondary | ICD-10-CM

## 2018-05-24 DIAGNOSIS — Z96652 Presence of left artificial knee joint: Secondary | ICD-10-CM

## 2018-05-24 NOTE — Progress Notes (Signed)
Patient ID: Colleen Lee, female   DOB: 1956/03/29, 62 y.o.   MRN: [id number]  Chief Complaint  Patient presents with  . Knee Pain    had left knee replacement has continued pain   . Shoulder Pain    right shoulder pain with ROM     HPI Colleen Lee is a 62 y.o. female.  62 years old no history of trauma presents for evaluation of her right shoulder complaining of right shoulder pain painful range of motion painful activities of daily living such as eating washing dishes and combing her hair.  She complains of a dull throbbing aching pain over the right deltoid which has become severe and present for 2 months worsening  Review of Systems Review of Systems  Constitutional: Negative for fever.  Musculoskeletal: Positive for joint pain.       Left knee injury, left knee arthroscopy after left knee tibial plateau fracture history of chronic left knee pain  Neurological: Negative for tingling and sensory change.    Past Medical History:  Diagnosis Date  . Asthma   . Chronic abdominal pain   . Chronic back pain   . Chronic constipation   . Cirrhosis ([place])    Metavir score F4 on elastography 2015  . Cyclical vomiting   . Fibroids   . Fibromyalgia   . GERD (gastroesophageal reflux disease)   . H. pylori infection 2014   treated with pylera, had to be treated again as it was not eradicated. Urea breath test then negative after subsequent treatment.   . [name]    HCV RNA positive 09/2012  . Hypertension   . Marijuana use   . Nausea and vomiting    chronic, recurrent  . PONV (postoperative nausea and vomiting)    pt thinks maybe once she had N&V  . Sciatica of left side     Past Surgical History:  Procedure Laterality Date  . BALLOON DILATION  12/20/2017   Procedure: BALLOON DILATION;  Surgeon: Danie Binder, MD;  [facility];  Service: Endoscopy;;  pyloric dilation  . BIOPSY  12/20/2017   Procedure: BIOPSY;  Surgeon: Danie Binder, MD;  [facility];  Service: Endoscopy;;  duodenal and gastric biopsy  . COLONOSCOPY  01/18/2008   ONG:[id number] rectum.  Long redundant colon, a diminutive sigmoid polyp status post cold biopsy removed. Hyperplastic polyp. Repeat colonoscopy June 2014 due to family history of colon cancer  . COLONOSCOPY WITH ESOPHAGOGASTRODUODENOSCOPY (EGD) N/A 11/02/2012   [id number] gastric mucosa of doubtful, +H.pylori. Incomplete colonoscopy due to patient unable to tolerate exam, proximal colon seen. Patient refused ACBE.  Marland Kitchen COLONOSCOPY WITH PROPOFOL N/A 03/08/2013   Dr. Gala Romney: colonic polyp-removed as scribed above. Internal Hemorrhoids. Pathology did not reveal any colonic tissue, only mucus. SURVEILLANCE DUE Aug 2019  . COLONOSCOPY WITH PROPOFOL N/A 01/19/2018   Dr. Gala Romney: 6 mm cecal inflammatoy polyp, otherwise normal., Surveilance in 5 year s  . ESOPHAGEAL DILATION  12/20/2017   Procedure: ESOPHAGEAL DILATION;  Surgeon: Danie Binder, MD;  [facility];  Service: Endoscopy;;  . ESOPHAGOGASTRODUODENOSCOPY  01/18/2008   RMR: Normal esophagus, normal  stomach  . ESOPHAGOGASTRODUODENOSCOPY (EGD) WITH PROPOFOL N/A 02/09/2016   Normal esophagus, small hiatal hernia, portal hypertensive gastropathy, normal second portion of duodenum. Due in July 2019  . ESOPHAGOGASTRODUODENOSCOPY (EGD) WITH PROPOFOL N/A 12/20/2017   Dr. Oneida Alar: benign appearing esophageal stenosis s/p dilation, mild pyloric stenosis s/p biopsy and dilation, mild  duodenitis.   Marland Kitchen HYSTEROSCOPY  05/11/2016   Procedure: HYSTEROSCOPY;  Surgeon: Jonnie Kind, MD;  [facility];  Service: Gynecology;;  . KNEE SURGERY     left knee  . MULTIPLE EXTRACTIONS WITH ALVEOLOPLASTY N/A 10/15/2013   Procedure: MULTIPLE EXTRACTION WITH ALVEOLOPLASTY;  Surgeon: Gae Bon, DDS;  Location: [place];  Service: Oral Surgery;  Laterality: N/A;  . POLYPECTOMY N/A 03/08/2013   Procedure: POLYPECTOMY;  Surgeon: Daneil Dolin, MD;  [facility];  Service:  Endoscopy;  Laterality: N/A;  . POLYPECTOMY N/A 05/11/2016   Procedure: REMOVAL OF ENDOMETRIAL POLYP;  Surgeon: Jonnie Kind, MD;  [facility];  Service: Gynecology;  Laterality: N/A;  . TOTAL KNEE ARTHROPLASTY Left 02/05/2015   Procedure: LEFT TOTAL KNEE ARTHROPLASTY;  Surgeon: Carole Civil, MD;  [facility];  Service: Orthopedics;  Laterality: Left;    Family History  Problem Relation Age of Onset  . Colon cancer Brother 51          . Multiple myeloma Brother   . Liver cancer Sister   . Prostate cancer Brother   . Pancreatic cancer Brother   . Cancer Mother        breast  . Asthma Mother      Social History   Tobacco Use  . Smoking status: Current Every Day Smoker    Packs/day: 0.50    Years: 40.00    Pack years: 20.00    Types: Cigarettes  . Smokeless tobacco: Never Used  . Tobacco comment: Smokes one pack of cigarettes daily  Substance Use Topics  . Alcohol use: No    Alcohol/week: 0.0 standard drinks  . Drug use: No    Types: Marijuana    Comment: history of marijuana in past    Allergies  Allergen Reactions  . Penicillins Rash    Has patient had a PCN reaction causing immediate rash, facial/tongue/throat swelling, SOB or lightheadedness with hypotension: Yes Has patient had a PCN reaction causing severe rash involving mucus membranes or skin necrosis: No Has patient had a PCN reaction that required hospitalization No Has patient had a PCN reaction occurring within the last 10 years: No If all of the above answers are "NO", then may proceed with Cephalosporin use.     Allergies  Allergen Reactions  . Penicillins Rash    Has patient had a PCN reaction causing immediate rash, facial/tongue/throat swelling, SOB or lightheadedness with hypotension: Yes Has patient had a PCN reaction causing severe rash involving mucus membranes or skin necrosis: No Has patient had a PCN reaction that required hospitalization No Has patient had a PCN reaction  occurring within the last 10 years: No If all of the above answers are "NO", then may proceed with Cephalosporin use.      Current Meds  Medication Sig  . albuterol (PROVENTIL HFA;VENTOLIN HFA) 108 (90 Base) MCG/ACT inhaler Inhale into the lungs every 6 (six) hours as needed for wheezing or shortness of breath.  . budesonide-formoterol (SYMBICORT) 160-4.5 MCG/ACT inhaler Inhale 2 puffs into the lungs 2 (two) times daily.  . Cyanocobalamin (VITAMIN B 12 PO) Take 1 tablet by mouth daily.  . cyclobenzaprine (FLEXERIL) 10 MG tablet Take 10 mg by mouth 3 (three) times daily as needed for muscle spasms.  . DULoxetine (CYMBALTA) 30 MG capsule Take 1 capsule by mouth 2 (two) times daily.  . hydrALAZINE (APRESOLINE) 50 MG tablet Take 1 tablet (50 mg total) by mouth every 8 (eight) hours.  Marland Kitchen  HYDROcodone-acetaminophen (NORCO/VICODIN) 5-325 MG tablet Take 1 tablet by mouth as needed for moderate pain.   . hydrOXYzine (ATARAX/VISTARIL) 10 MG tablet Take 10 mg by mouth daily.   . pantoprazole (PROTONIX) 40 MG tablet Take 1 tablet (40 mg total) by mouth 2 (two) times daily before a meal.  . potassium chloride SA (K-DUR,KLOR-CON) 20 MEQ tablet Take 20 mEq by mouth 2 (two) times daily.       Physical Exam BP 103/61   Pulse 69   Ht '5\' 6"'$  (1.676 m)   Wt 178 lb (80.7 kg)   BMI 28.73 kg/m  Physical Exam  Constitutional: She is oriented to person, place, and time. She appears well-developed and well-nourished.  Neurological: She is alert and oriented to person, place, and time.  Psychiatric: She has a normal mood and affect. Judgment normal.  Vitals reviewed.  Ambulatory status normal with no assistive devices Right Shoulder Exam   Tenderness  Right shoulder tenderness location: Peri-acromial deltoid.  Range of Motion  Active abduction: abnormal  Passive abduction: abnormal  Extension: normal  External rotation: normal  Forward flexion: 120 (Painful forward flexion in the scapular plane)    Muscle Strength  The patient has normal right shoulder strength. Biceps: 5/5   Tests  Apprehension: negative Hawkins test: negative Cross arm: negative Impingement: positive Drop arm: negative Sulcus: absent  Other  Erythema: absent Sensation: normal Pulse: present   Left Shoulder Exam  Left shoulder exam is normal.  Tenderness  The patient is experiencing no tenderness.   Range of Motion  The patient has normal left shoulder ROM.  Muscle Strength  The patient has normal left shoulder strength.  Tests  Apprehension: negative  Other  Erythema: absent Sensation: normal Pulse: present       PROVOCATIVE TESTS DROP ARM TEST-normal  PAINFUL ARC 70-150 EMPTY CAN -JOBST TEST normal  EXTERNAL ROTATION LAG TEST normal  LIFT OFF TEST normal  BELLYPRESS TEST normql    MEDICAL DECISION SECTION  xrays ordered? Yes   My independent reading of xrays: Right shoulder shows proximal migration of the humerus suggesting rotator cuff tear chronic with mild degenerative changes at the margin inferiorly of the glenohumeral joint no significant joint space narrowing the greater tuberosity has sclerosis in the undersurface of the acromion shows that there has been increased pressure  The left knee total knee replacement x-ray looks normal  Patient was complaining of some left leg and knee pain.  It should be noted that she never got her car spine repaired and has chronic pain from that as well   Encounter Diagnoses  Name Primary?  . Chronic right shoulder pain Yes  . Chronic pain of left knee   . 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  Injection?  Right shoulder injection   Procedure note the subacromial injection shoulder RIGHT  Verbal consent was obtained to inject the  RIGHT   Shoulder  Timeout was completed to confirm the injection site is a subacromial space of the  RIGHT   shoulder   Medication used Depo-Medrol 40 mg and lidocaine 1% 3 cc  Anesthesia was provided by ethyl chloride  The injection was performed in the RIGHT  posterior subacromial space. After pinning the skin with alcohol and anesthetized the skin with ethyl chloride the subacromial space was injected using a 20-gauge needle. There were no complications  Sterile dressing was applied.

## 2018-06-05 ENCOUNTER — Other Ambulatory Visit: Payer: Self-pay | Admitting: Nurse Practitioner

## 2018-06-13 DIAGNOSIS — G894 Chronic pain syndrome: Secondary | ICD-10-CM | POA: Diagnosis not present

## 2018-06-13 DIAGNOSIS — Z79891 Long term (current) use of opiate analgesic: Secondary | ICD-10-CM | POA: Diagnosis not present

## 2018-06-13 DIAGNOSIS — M13862 Other specified arthritis, left knee: Secondary | ICD-10-CM | POA: Diagnosis not present

## 2018-06-13 DIAGNOSIS — M461 Sacroiliitis, not elsewhere classified: Secondary | ICD-10-CM | POA: Diagnosis not present

## 2018-06-21 ENCOUNTER — Ambulatory Visit (HOSPITAL_COMMUNITY): Payer: Medicare Other

## 2018-06-22 ENCOUNTER — Ambulatory Visit (HOSPITAL_COMMUNITY)
Admission: RE | Admit: 2018-06-22 | Discharge: 2018-06-22 | Disposition: A | Discharge status: Home / Self Care | Payer: Medicare Other | Source: Ambulatory Visit | Attending: Neurology | Admitting: Neurology

## 2018-06-22 DIAGNOSIS — I639 Cerebral infarction, unspecified: Secondary | ICD-10-CM

## 2018-06-22 DIAGNOSIS — I63231 Cerebral infarction due to unspecified occlusion or stenosis of right carotid arteries: Secondary | ICD-10-CM | POA: Diagnosis not present

## 2018-06-22 DIAGNOSIS — I6521 Occlusion and stenosis of right carotid artery: Secondary | ICD-10-CM | POA: Insufficient documentation

## 2018-06-22 DIAGNOSIS — Z87891 Personal history of nicotine dependence: Secondary | ICD-10-CM | POA: Insufficient documentation

## 2018-06-22 DIAGNOSIS — G459 Transient cerebral ischemic attack, unspecified: Secondary | ICD-10-CM | POA: Diagnosis present

## 2018-07-10 ENCOUNTER — Emergency Department (HOSPITAL_COMMUNITY)
Admission: EM | Admit: 2018-07-10 | Discharge: 2018-07-10 | Disposition: A | Discharge status: Home / Self Care | Payer: Medicare Other | Attending: Emergency Medicine | Admitting: Emergency Medicine

## 2018-07-10 ENCOUNTER — Other Ambulatory Visit: Payer: Self-pay

## 2018-07-10 ENCOUNTER — Encounter (HOSPITAL_COMMUNITY): Payer: Self-pay | Admitting: Emergency Medicine

## 2018-07-10 ENCOUNTER — Emergency Department (HOSPITAL_COMMUNITY): Payer: Medicare Other

## 2018-07-10 DIAGNOSIS — J209 Acute bronchitis, unspecified: Secondary | ICD-10-CM | POA: Diagnosis not present

## 2018-07-10 DIAGNOSIS — R05 Cough: Secondary | ICD-10-CM | POA: Diagnosis not present

## 2018-07-10 DIAGNOSIS — F1290 Cannabis use, unspecified, uncomplicated: Secondary | ICD-10-CM | POA: Insufficient documentation

## 2018-07-10 DIAGNOSIS — J45909 Unspecified asthma, uncomplicated: Secondary | ICD-10-CM | POA: Diagnosis not present

## 2018-07-10 DIAGNOSIS — F17210 Nicotine dependence, cigarettes, uncomplicated: Secondary | ICD-10-CM | POA: Insufficient documentation

## 2018-07-10 DIAGNOSIS — I10 Essential (primary) hypertension: Secondary | ICD-10-CM | POA: Insufficient documentation

## 2018-07-10 DIAGNOSIS — Z79899 Other long term (current) drug therapy: Secondary | ICD-10-CM | POA: Diagnosis not present

## 2018-07-10 DIAGNOSIS — Z96652 Presence of left artificial knee joint: Secondary | ICD-10-CM | POA: Insufficient documentation

## 2018-07-10 DIAGNOSIS — R062 Wheezing: Secondary | ICD-10-CM | POA: Diagnosis not present

## 2018-07-10 DIAGNOSIS — R0602 Shortness of breath: Secondary | ICD-10-CM | POA: Diagnosis not present

## 2018-07-10 MED ORDER — IPRATROPIUM-ALBUTEROL 0.5-2.5 (3) MG/3ML IN SOLN
3.0000 mL | Freq: Once | RESPIRATORY_TRACT | Status: AC
  Administered 2018-07-10: 3 mL via RESPIRATORY_TRACT
  Filled 2018-07-10: qty 3

## 2018-07-10 MED ORDER — PROMETHAZINE-DM 6.25-15 MG/5ML PO SYRP: 10.0000 mL | ORAL_SOLUTION | Freq: Four times a day (QID) | ORAL | 0 refills | Status: DC | PRN

## 2018-07-10 MED ORDER — DEXAMETHASONE 4 MG PO TABS: 4.0000 mg | ORAL_TABLET | Freq: Two times a day (BID) | ORAL | 0 refills | Status: DC

## 2018-07-10 MED ORDER — DEXAMETHASONE SODIUM PHOSPHATE 10 MG/ML IJ SOLN
10.0000 mg | Freq: Once | INTRAMUSCULAR | Status: AC
  Administered 2018-07-10: 10 mg via INTRAMUSCULAR
  Filled 2018-07-10: qty 1

## 2018-07-10 MED ORDER — DOXYCYCLINE HYCLATE 100 MG PO TABS
100.0000 mg | ORAL_TABLET | Freq: Once | ORAL | Status: AC
  Administered 2018-07-10: 100 mg via ORAL
  Filled 2018-07-10: qty 1

## 2018-07-10 MED ORDER — DOXYCYCLINE HYCLATE 100 MG PO CAPS: 100.0000 mg | ORAL_CAPSULE | Freq: Two times a day (BID) | ORAL | 0 refills | Status: DC

## 2018-07-10 MED ORDER — ALBUTEROL SULFATE HFA 108 (90 BASE) MCG/ACT IN AERS
2.0000 | INHALATION_SPRAY | Freq: Once | RESPIRATORY_TRACT | Status: AC
  Administered 2018-07-10: 2 via RESPIRATORY_TRACT
  Filled 2018-07-10: qty 6.7

## 2018-07-10 MED ORDER — HYDROCOD POLST-CPM POLST ER 10-8 MG/5ML PO SUER
5.0000 mL | Freq: Once | ORAL | Status: AC
  Administered 2018-07-10: 5 mL via ORAL
  Filled 2018-07-10: qty 5

## 2018-07-10 NOTE — Discharge Instructions (Addendum)
Your chest x-ray is negative for pneumonia.  There is some evidence of bronchitis.  Your examination reveals wheezing present.  Please use 2 puffs of albuterol every 4 hours.  Use Decadron and doxycycline 2 times daily with food. Please see Dr. Legrand Rams at the end of the week for follow-up concerning your bronchitis.

## 2018-07-10 NOTE — ED Triage Notes (Signed)
Patient reports cough that has caused pain in her abdomen and back. Symptoms started 12/2. Reports productive cough with clear sputum.

## 2018-07-10 NOTE — ED Provider Notes (Signed)
[facility] EMERGENCY DEPARTMENT Provider Note   CSN: [id number] Arrival date & time: 07/10/18  1351     History   Chief Complaint Chief Complaint  Patient presents with  . Cough    HPI Colleen Lee is a 62 y.o. female.  The history is provided by the patient.  Cough  This is a new problem. The current episode started more than 1 week ago. The problem occurs hourly. The problem has been gradually worsening. The cough is productive of sputum. There has been no fever. Associated symptoms include shortness of breath and wheezing. Pertinent negatives include no chest pain. Associated symptoms comments: abd and back pain. Treatments tried: OTC cold medication. The treatment provided no relief. She is a smoker. Her past medical history is significant for bronchitis and asthma.    Past Medical History:  Diagnosis Date  . Asthma   . Chronic abdominal pain   . Chronic back pain   . Chronic constipation   . Cirrhosis ([place])    Metavir score F4 on elastography 2015  . Cyclical vomiting   . Fibroids   . Fibromyalgia   . GERD (gastroesophageal reflux disease)   . H. pylori infection 2014   treated with pylera, had to be treated again as it was not eradicated. Urea breath test then negative after subsequent treatment.   . [name]    HCV RNA positive 09/2012  . Hypertension   . Marijuana use   . Nausea and vomiting    chronic, recurrent  . PONV (postoperative nausea and vomiting)    pt thinks maybe once she had N&V  . Sciatica of left side     Patient Active Problem List   Diagnosis Date Noted  . S/P total knee replacement, 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/24/2018  . Shoulder impingement, right 05/24/2018  . Dehydration   . Intractable cyclical vomiting 71/12/2692  . Pyloric stenosis in adult   . Thrombocytopenia ([place]) 12/20/2017  . Leukocytosis   . Malnutrition of moderate degree 12/17/2017  . Acute renal failure (ARF) ([place]) 12/15/2017  . Chronic abdominal pain 12/15/2017  .  Gastroenteritis 06/14/2016  . Nausea with vomiting 06/10/2016  . Uterine enlargement 06/09/2016  . Intractable nausea and vomiting 06/08/2016  . Acute infective gastroenteritis 06/08/2016  . Diarrhea 06/08/2016  . Essential hypertension 06/08/2016  . GERD (gastroesophageal reflux disease) 06/08/2016  . Abdominal pain   . Endometrial polyp 04/15/2016  . PMB (postmenopausal bleeding) 02/27/2016  . Alcoholic cirrhosis of liver without ascites ([place])   . Asthma 01/21/2016  . Hepatic cirrhosis ([place]) 09/22/2015  . Arthritis of knee, degenerative 02/05/2015  . History of Helicobacter pylori infection 10/30/2014  . Chronic [name] with cirrhosis ([place]) 04/11/2014  . De Quervain's disease (radial styloid tenosynovitis) 12/25/2013  . Anorexia 11/21/2012  . FH: colon cancer 11/21/2012  . Early satiety 10/25/2012  . Bowel habit changes 10/25/2012  . Abdominal pain, epigastric 10/25/2012  . Abdominal bloating 10/25/2012  . Constipation 10/25/2012  . Abnormal weight loss 10/25/2012  . Chronic viral [name] ([place]) 10/25/2012  . Radicular pain of left lower extremity 09/28/2012  . Back pain 09/28/2012  . Sciatica 08/10/2011  . S/P arthroscopy of left knee 08/10/2011  . Tibial plateau fracture 08/10/2011  . Pain in joint, lower leg 02/12/2011  . Stiffness of joint, not elsewhere classified, lower leg 02/12/2011  . 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 Meniscus, medial, derangement 12/29/2010  . CLOSED FRACTURE OF UPPER END OF TIBIA 08/12/2010    Past Surgical  History:  Procedure Laterality Date  . BALLOON DILATION  12/20/2017   Procedure: BALLOON DILATION;  Surgeon: Danie Binder, MD;  Location[facility];  Service: Endoscopy;;  pyloric dilation  . BIOPSY  12/20/2017   Procedure: BIOPSY;  Surgeon: Danie Binder, MD;  Location: [facility];  Service: Endoscopy;;  duodenal and gastric biopsy  . COLONOSCOPY  01/18/2008   FAO:ZHYQMV rectum.  Long redundant colon, a diminutive  sigmoid polyp status post cold biopsy removed. Hyperplastic polyp. Repeat colonoscopy June 2014 due to family history of colon cancer  . COLONOSCOPY WITH ESOPHAGOGASTRODUODENOSCOPY (EGD) N/A 11/02/2012   HQI:ONGEXBMW gastric mucosa of doubtful, +H.pylori. Incomplete colonoscopy due to patient unable to tolerate exam, proximal colon seen. Patient refused ACBE.  Marland Kitchen COLONOSCOPY WITH PROPOFOL N/A 03/08/2013   Dr. Gala Romney: colonic polyp-removed as scribed above. Internal Hemorrhoids. Pathology did not reveal any colonic tissue, only mucus. SURVEILLANCE DUE Aug 2019  . COLONOSCOPY WITH PROPOFOL N/A 01/19/2018   Dr. Gala Romney: 6 mm cecal inflammatoy polyp, otherwise normal., Surveilance in 5 year s  . ESOPHAGEAL DILATION  12/20/2017   Procedure: ESOPHAGEAL DILATION;  Surgeon: Danie Binder, MD;  Location: [facility];  Service: Endoscopy;;  . ESOPHAGOGASTRODUODENOSCOPY  01/18/2008   RMR: Normal esophagus, normal  stomach  . ESOPHAGOGASTRODUODENOSCOPY (EGD) WITH PROPOFOL N/A 02/09/2016   Normal esophagus, small hiatal hernia, portal hypertensive gastropathy, normal second portion of duodenum. Due in July 2019  . ESOPHAGOGASTRODUODENOSCOPY (EGD) WITH PROPOFOL N/A 12/20/2017   Dr. Oneida Alar: benign appearing esophageal stenosis s/p dilation, mild pyloric stenosis s/p biopsy and dilation, mild duodenitis.   Marland Kitchen HYSTEROSCOPY  05/11/2016   Procedure: HYSTEROSCOPY;  Surgeon: Jonnie Kind, MD;  Location: [facility] OR[facility];  Service: Gynecology;;  . KNEE SURGERY     left knee  . MULTIPLE EXTRACTIONS WITH ALVEOLOPLASTY N/A 10/15/2013   Procedure: MULTIPLE EXTRACTION WITH ALVEOLOPLASTY;  Surgeon: Gae Bon, DDS;  Location: [place];  Service: Oral Surgery;  Laterality: N/A;  . POLYPECTOMY N/A 03/08/2013   Procedure: POLYPECTOMY;  Surgeon: Daneil Dolin, MD;  Location: [facility];  Service: Endoscopy;  Laterality: N/A;  . POLYPECTOMY N/A 05/11/2016   Procedure: REMOVAL OF ENDOMETRIAL POLYP;  Surgeon: Jonnie Kind, MD;  Location: [facility];  Service: Gynecology;  Laterality: N/A;  . TOTAL KNEE ARTHROPLASTY Left 02/05/2015   Procedure: LEFT TOTAL KNEE ARTHROPLASTY;  Surgeon: Carole Civil, MD;  Location: [facility];  Service: Orthopedics;  Laterality: Left;     OB History    Gravida      Para      Term      Preterm      AB      Living  1     SAB      TAB      Ectopic      Multiple      Live Births               Home Medications    Prior to Admission medications   Medication Sig Start Date End Date Taking? Authorizing Provider  albuterol (PROVENTIL HFA;VENTOLIN HFA) 108 (90 Base) MCG/ACT inhaler Inhale into the lungs every 6 (six) hours as needed for wheezing or shortness of breath.   Yes [provider]  budesonide-formoterol (SYMBICORT) 160-4.5 MCG/ACT inhaler Inhale 2 puffs into the lungs 2 (two) times daily.   Yes [provider]  Cyanocobalamin (VITAMIN B 12 PO) Take 1 tablet by mouth daily.   Yes [provider]  cyclobenzaprine (FLEXERIL) 10 MG tablet Take 10 mg by mouth 3 (three) times daily as needed for muscle spasms.   Yes [provider]  DULoxetine (CYMBALTA) 30 MG capsule Take 1 capsule by mouth 2 (two) times daily. 11/02/17  Yes [provider]  hydrALAZINE (APRESOLINE) 50 MG tablet Take 1 tablet (50 mg total) by mouth every 8 (eight) hours. 01/30/18  Yes Barton Dubois, MD  HYDROcodone-acetaminophen (NORCO/VICODIN) 5-325 MG tablet Take 1 tablet by mouth as needed for moderate pain.  11/02/17  Yes [provider]  hydrOXYzine (ATARAX/VISTARIL) 10 MG tablet Take 10 mg by mouth daily.  11/30/17  Yes [provider]  labetalol (NORMODYNE) 100 MG tablet Take 2 tablets (200 mg total) by mouth 2 (two) times daily. 01/30/18  Yes Barton Dubois, MD  losartan-hydrochlorothiazide [facility]) 100-12.5 MG tablet  05/11/18  Yes [provider]  megestrol (MEGACE) 40 MG tablet Take 40 mg by mouth daily.   Yes [provider]    metoCLOPramide (REGLAN) 10 MG tablet  05/11/18  Yes [provider]  TRULANCE 3 MG TABS TAKE 1 TABLET BY MOUTH ONCE A DAY. 06/07/18  Yes Carlis Stable, NP  furosemide (LASIX) 20 MG tablet Take 1 tablet (20 mg total) by mouth daily. Patient not taking: Reported on 05/24/2018 01/31/18   Barton Dubois, MD  losartan (COZAAR) 100 MG tablet Take 1 tablet (100 mg total) by mouth daily. Patient not taking: Reported on 05/24/2018 01/31/18   Barton Dubois, MD  ondansetron (ZOFRAN ODT) 4 MG disintegrating tablet Take 1 tablet (4 mg total) by mouth 3 (three) times daily. 68m ODT q4 hours prn nausea/vomit Patient not taking: Reported on 05/24/2018 12/22/17   MKathie Dike MD  pantoprazole (PROTONIX) 40 MG tablet Take 1 tablet (40 mg total) by mouth 2 (two) times daily before a meal. 12/22/17   MKathie Dike MD  potassium chloride SA (K-DUR,KLOR-CON) 20 MEQ tablet Take 20 mEq by mouth 2 (two) times daily.    [provider]    Family History Family History  Problem Relation Age of Onset  . Colon cancer Brother 585         . Multiple myeloma Brother   . Liver cancer Sister   . Prostate cancer Brother   . Pancreatic cancer Brother   . Cancer Mother        breast  . Asthma Mother     Social History Social History   Tobacco Use  . Smoking status: Current Every Day Smoker    Packs/day: 1.00    Years: 40.00    Pack years: 40.00    Types: Cigarettes  . Smokeless tobacco: Never Used  . Tobacco comment: Smokes one pack of cigarettes daily  Substance Use Topics  . Alcohol use: Yes    Alcohol/week: 0.0 standard drinks    Comment: occas  . Drug use: Not Currently    Types: Marijuana    Comment: history of marijuana in past     Allergies   Penicillins   Review of Systems Review of Systems  Constitutional: Positive for activity change. Negative for fever.       All ROS Neg except as noted in HPI  HENT: Positive for congestion. Negative for nosebleeds.   Eyes: Negative  for photophobia and discharge.  Respiratory: Positive for cough, shortness of breath and wheezing.   Cardiovascular: Negative for chest pain and palpitations.  Gastrointestinal: Negative for abdominal pain and blood in stool.  Genitourinary: Negative for  dysuria, frequency and hematuria.  Musculoskeletal: Positive for back pain. Negative for arthralgias and neck pain.  Skin: Negative.   Neurological: Negative for dizziness, seizures and speech difficulty.  Psychiatric/Behavioral: Negative for confusion and hallucinations.     Physical Exam Updated Vital Signs BP 131/72   Pulse 100   Temp 98.2 F (36.8 C) (Oral)   Resp (!) 24   Ht 5' 6" (1.676 m)   Wt 80.7 kg   SpO2 100%   BMI 28.73 kg/m   Physical Exam  Constitutional: She is oriented to person, place, and time. She appears well-developed and well-nourished.  Non-toxic appearance.  HENT:  Head: Normocephalic.  Right Ear: Tympanic membrane and external ear normal.  Left Ear: Tympanic membrane and external ear normal.  Eyes: Pupils are equal, round, and reactive to light. EOM and lids are normal.  Neck: Normal range of motion. Neck supple. Carotid bruit is not present.  Cardiovascular: Normal rate, regular rhythm, normal heart sounds, intact distal pulses and normal pulses.  Pulmonary/Chest: No accessory muscle usage. No tachypnea. No respiratory distress. She has wheezes. She has rhonchi.  Abdominal: Soft. Bowel sounds are normal. There is no tenderness. There is no guarding.  Musculoskeletal: Normal range of motion.       Cervical back: She exhibits pain and spasm.       Lumbar back: She exhibits pain and spasm.       Back:  Lymphadenopathy:       Head (right side): No submandibular adenopathy present.       Head (left side): No submandibular adenopathy present.    She has no cervical adenopathy.  Neurological: She is alert and oriented to person, place, and time. She has normal strength. No cranial nerve deficit or  sensory deficit.  Skin: Skin is warm and dry.  Psychiatric: She has a normal mood and affect. Her speech is normal.  Nursing note and vitals reviewed.    ED Treatments / Results  Labs (all labs ordered are listed, but only abnormal results are displayed) Labs Reviewed - No data to display  EKG None  Radiology Dg Chest 2 View  Result Date: 07/10/2018 CLINICAL DATA:  Cough EXAM: CHEST - 2 VIEW COMPARISON:  12/18/2017 FINDINGS: Mild pulmonary hyperinflation. Lungs are clear without infiltrate effusion or mass. Heart size and vascularity normal. No acute skeletal abnormality IMPRESSION: Mild pulmonary hyperinflation.  No acute abnormality Electronically Signed   By: Franchot Gallo M.D.   On: 07/10/2018 14:28    Procedures Procedures (including critical care time)  Medications Ordered in ED Medications - No data to display   Initial Impression / Assessment and Plan / ED Course  I have reviewed the triage vital signs and the nursing notes.  Pertinent labs & imaging results that were available during my care of the patient were reviewed by me and considered in my medical decision making (see chart for details).       Final Clinical Impressions(s) / ED Diagnoses MDM  Patient very upset upon my arrival to the room.  She states that she does not understand why it takes so long to be seen.  She says no one has been in her room to check on her.  The nursing note states that the patient ambulated into the hallway, was yelling in the hallway, and also ambulated to the bathroom.  Her pulse oximetry is 96% on room air.  I apologized to the patient for the wait.I asked her to allow [place] to examine and  evaluate her and take care of her issue.  She says that she was about to leave that she would allow [place] to examine her.  After the examination I discussed with the patient all of the findings.  I discussed the chest x-ray with her.  Medications for her cough and congestion were started here in the  emergency department.  The patient was given an inhaler to use.  Prescription for Decadron, doxycycline, and Promethazine DM given to the patient.  Have asked the patient to follow-up with Dr. Legrand Rams in the next few days for follow-up of her bronchitis.  I gave the patient the opportunity for questions.  I invited the patient to return if any changes in her condition, problems, or concerns.   Final diagnoses:  None    ED Discharge Orders         Ordered    dexamethasone (DECADRON) 4 MG tablet  2 times daily with meals     07/10/18 1632    doxycycline (VIBRAMYCIN) 100 MG capsule  2 times daily     07/10/18 1632    promethazine-dextromethorphan (PROMETHAZINE-DM) 6.25-15 MG/5ML syrup  4 times daily PRN     07/10/18 1632           Lily Kocher, PA-C 07/11/18 2116    Varney Biles, MD 07/12/18 2320

## 2018-07-10 NOTE — ED Notes (Signed)
ED Provider at bedside. 

## 2018-07-10 NOTE — ED Notes (Signed)
Pt ambulated to bathroom and back, pt yelling in hallway wanting to know how much longer it'll be before doctor comes in, explained to pt that the PA will be in as quickly.

## 2018-07-13 NOTE — Telephone Encounter (Signed)
PA for Trulance was submitted on [email or web address]. waiting on an approval or denial.

## 2018-07-19 ENCOUNTER — Encounter: Payer: Self-pay | Admitting: Orthopedic Surgery

## 2018-07-19 ENCOUNTER — Telehealth: Payer: Self-pay

## 2018-07-19 ENCOUNTER — Ambulatory Visit (INDEPENDENT_AMBULATORY_CARE_PROVIDER_SITE_OTHER): Payer: Medicare Other | Admitting: Orthopedic Surgery

## 2018-07-19 VITALS — BP 74/44 | HR 75 | Ht 66.0 in | Wt 172.0 lb

## 2018-07-19 DIAGNOSIS — M12811 Other specific arthropathies, not elsewhere classified, right shoulder: Secondary | ICD-10-CM | POA: Diagnosis not present

## 2018-07-19 DIAGNOSIS — G8929 Other chronic pain: Secondary | ICD-10-CM

## 2018-07-19 DIAGNOSIS — M25511 Pain in right shoulder: Secondary | ICD-10-CM | POA: Diagnosis not present

## 2018-07-19 DIAGNOSIS — M25562 Pain in left knee: Secondary | ICD-10-CM | POA: Diagnosis not present

## 2018-07-19 NOTE — Patient Instructions (Signed)
Start OT at [facility]   You have received an injection of steroids into the joint. 15% of patients will have increased pain within the 24 hours postinjection.   This is transient and will go away.   We recommend that you use ice packs on the injection site for 20 minutes every 2 hours and extra strength Tylenol 2 tablets every 8 as needed until the pain resolves.  If you continue to have pain after taking the Tylenol and using the ice please call the office for further instructions.

## 2018-07-19 NOTE — Telephone Encounter (Signed)
PA for Trulance 3 mg was submitted through [email or web address]. approval received today through 08/02/2019. Letter of approval scanned in chart.

## 2018-07-19 NOTE — Progress Notes (Addendum)
Progress Note   Patient ID: Colleen Lee, female   DOB: 1956-03-16, 62 y.o.   MRN: [id number]   Chief Complaint  Patient presents with  . Shoulder Pain    right     62 year old female presents for follow-up last seen October 23 for her right shoulder.  She has atraumatic pain right shoulder painful range of motion difficulty with activities of daily living no improvement after 1 subacromial injection x-ray shows rotator cuff arthropathy with acromiohumeral distance 8.5 slight proximal migration of the humerus without arthritis.    Review of Systems  Musculoskeletal: Positive for neck pain.       Muscle ache around the shoulder trapezius or cervical spine     Allergies  Allergen Reactions  . Penicillins Rash    Has patient had a PCN reaction causing immediate rash, facial/tongue/throat swelling, SOB or lightheadedness with hypotension: Yes Has patient had a PCN reaction causing severe rash involving mucus membranes or skin necrosis: No Has patient had a PCN reaction that required hospitalization No Has patient had a PCN reaction occurring within the last 10 years: No If all of the above answers are "NO", then may proceed with Cephalosporin use.      BP (!) 77/45   Pulse 75   Ht 5\' 6"  (1.676 m)   Wt 172 lb (78 kg)   BMI 27.76 kg/m   Physical Exam Vitals signs reviewed.  Constitutional:      Appearance: She is well-developed.  Musculoskeletal:     Left shoulder: She exhibits normal range of motion, no tenderness, no bony tenderness, no swelling, no effusion, no crepitus, normal pulse and normal strength.       Arms:  Neurological:     Mental Status: She is alert and oriented to person, place, and time.  Psychiatric:        Judgment: Judgment normal.      Medical decisions:   Data  Imaging:   X-ray on October 23 showed a acromial humeral distance of 8.5 there was sclerosis of the acromion and greater tuberosity with 2 to 3 mm proximal subluxation of the  humerus suggesting arthropathy of the rotator cuff glenohumeral joint  Encounter Diagnoses  Name Primary?  . Chronic right shoulder pain Yes  . Rotator cuff arthropathy of right shoulder     PLAN:   Procedure note left knee injection verbal consent was obtained to inject left knee joint  Timeout was completed to confirm the site of injection  The medications used were 40 mg of Depo-Medrol and 1% lidocaine 3 cc  Anesthesia was provided by ethyl chloride and the skin was prepped with alcohol.  After cleaning the skin with alcohol a 20-gauge needle was used to inject the left knee joint. There were no complications. A sterile bandage was applied.   Procedure note the subacromial injection shoulder RIGHT  Verbal consent was obtained to inject the  RIGHT   Shoulder  Timeout was completed to confirm the injection site is a subacromial space of the  RIGHT  shoulder   Medication used Depo-Medrol 40 mg and lidocaine 1% 3 cc  Anesthesia was provided by ethyl chloride  The injection was performed in the RIGHT  posterior subacromial space. After pinning the skin with alcohol and anesthetized the skin with ethyl chloride the subacromial space was injected using a 20-gauge needle. There were no complications  Sterile dressing was applied.    I recommended that we repeat her injections start occupational therapy at [facility]  follow-up in 6 weeks if no improvement then we can proceed with MRI as she will have exhausted nonoperative measures  She is hydrocodone and Flexeril for other reasons    Arther Abbott, MD 07/19/2018 9:44 AM

## 2018-07-25 DIAGNOSIS — J441 Chronic obstructive pulmonary disease with (acute) exacerbation: Secondary | ICD-10-CM | POA: Diagnosis not present

## 2018-08-03 ENCOUNTER — Encounter (HOSPITAL_COMMUNITY): Payer: Self-pay

## 2018-08-03 ENCOUNTER — Ambulatory Visit (HOSPITAL_COMMUNITY): Payer: Medicare Other | Attending: Orthopedic Surgery

## 2018-08-03 ENCOUNTER — Other Ambulatory Visit: Payer: Self-pay

## 2018-08-03 DIAGNOSIS — M25611 Stiffness of right shoulder, not elsewhere classified: Secondary | ICD-10-CM | POA: Insufficient documentation

## 2018-08-03 DIAGNOSIS — M25511 Pain in right shoulder: Secondary | ICD-10-CM | POA: Diagnosis not present

## 2018-08-03 DIAGNOSIS — J449 Chronic obstructive pulmonary disease, unspecified: Secondary | ICD-10-CM | POA: Diagnosis not present

## 2018-08-03 DIAGNOSIS — G8929 Other chronic pain: Secondary | ICD-10-CM | POA: Insufficient documentation

## 2018-08-03 DIAGNOSIS — R29898 Other symptoms and signs involving the musculoskeletal system: Secondary | ICD-10-CM | POA: Insufficient documentation

## 2018-08-03 NOTE — Patient Instructions (Signed)
  Complete 10 times for each exercises. 2-3 times.   1) Seated Row   Sit up straight with elbows by your sides. Pull back with shoulders/elbows, keeping forearms straight, as if pulling back on the reins of a horse. Squeeze shoulder blades together. Repeat _10__times, ____sets/day      3) Shoulder Extension    Sit up straight with both arms by your side, draw your arms back behind your waist. Keep your elbows straight. Repeat _10___times, ____sets/day.      SHOULDER: Flexion On Table   Place hands on towel placed on table, elbows straight. Lean forward with you upper body, pushing towel away from body.  Abduction (Passive)   With arm out to side, resting on towel placed on table, keeping trunk away from table, lean to the side while pushing towel away from body. Copyright  [employer]. All rights reserved.     Internal Rotation (Assistive)   Seated with elbow bent at right angle and held against side, slide arm on table surface in an inward arc keeping elbow anchored in place.  Activity: Use this motion to brush crumbs off the table.

## 2018-08-03 NOTE — Therapy (Signed)
[place], [place], [address] Phone: [phone]   Fax:  [phone]  Occupational Therapy Evaluation  Patient Details  Name: Colleen Lee MRN: [id number] Date of Birth: [birth year] Referring Provider (OT): Dr. Arther Abbott   Encounter Date: 08/03/2018  OT End of Session - 08/03/18 1741    Visit Number  1    Number of Visits  12    Date for OT Re-Evaluation  09/14/18   mini reassess: 08/28/18   Authorization Type  Medicare. no visit limit    OT Start Time  1650    OT Stop Time  1726    OT Time Calculation (min)  36 min    Activity Tolerance  Patient tolerated treatment well    Behavior During Therapy  WFL for tasks assessed/performed       Past Medical History:  Diagnosis Date  . Asthma   . Chronic abdominal pain   . Chronic back pain   . Chronic constipation   . Cirrhosis ([place])    Metavir score F4 on elastography 2015  . Cyclical vomiting   . Fibroids   . Fibromyalgia   . GERD (gastroesophageal reflux disease)   . H. pylori infection 2014   treated with pylera, had to be treated again as it was not eradicated. Urea breath test then negative after subsequent treatment.   . [name]    HCV RNA positive 09/2012  . Hypertension   . Marijuana use   . Nausea and vomiting    chronic, recurrent  . PONV (postoperative nausea and vomiting)    pt thinks maybe once she had N&V  . Sciatica of left side     Past Surgical History:  Procedure Laterality Date  . BALLOON DILATION  12/20/2017   Procedure: BALLOON DILATION;  Surgeon: Danie Binder, MD;  [facility];  Service: Endoscopy;;  pyloric dilation  . BIOPSY  12/20/2017   Procedure: BIOPSY;  Surgeon: Danie Binder, MD;  [facility];  Service: Endoscopy;;  duodenal and gastric biopsy  . COLONOSCOPY  01/18/2008   FXT:[id number] rectum.  Long redundant colon, a diminutive sigmoid polyp status post cold biopsy removed.  Hyperplastic polyp. Repeat colonoscopy June 2014 due to family history of colon cancer  . COLONOSCOPY WITH ESOPHAGOGASTRODUODENOSCOPY (EGD) N/A 11/02/2012   DZH:[id number] gastric mucosa of doubtful, +H.pylori. Incomplete colonoscopy due to patient unable to tolerate exam, proximal colon seen. Patient refused ACBE.  Marland Kitchen COLONOSCOPY WITH PROPOFOL N/A 03/08/2013   Dr. Gala Romney: colonic polyp-removed as scribed above. Internal Hemorrhoids. Pathology did not reveal any colonic tissue, only mucus. SURVEILLANCE DUE Aug 2019  . COLONOSCOPY WITH PROPOFOL N/A 01/19/2018   Dr. Gala Romney: 6 mm cecal inflammatoy polyp, otherwise normal., Surveilance in 5 year s  . ESOPHAGEAL DILATION  12/20/2017   Procedure: ESOPHAGEAL DILATION;  Surgeon: Danie Binder, MD;  [facility];  Service: Endoscopy;;  . ESOPHAGOGASTRODUODENOSCOPY  01/18/2008   RMR: Normal esophagus, normal  stomach  . ESOPHAGOGASTRODUODENOSCOPY (EGD) WITH PROPOFOL N/A 02/09/2016   Normal esophagus, small hiatal hernia, portal hypertensive gastropathy, normal second portion of duodenum. Due in July 2019  . ESOPHAGOGASTRODUODENOSCOPY (EGD) WITH PROPOFOL N/A 12/20/2017   Dr. Oneida Alar: benign appearing esophageal stenosis s/p dilation, mild pyloric stenosis s/p biopsy and dilation, mild duodenitis.   Marland Kitchen HYSTEROSCOPY  05/11/2016   Procedure: HYSTEROSCOPY;  Surgeon: Jonnie Kind, MD;  [facility];  Service: Gynecology;;  . KNEE SURGERY  left knee  . MULTIPLE EXTRACTIONS WITH ALVEOLOPLASTY N/A 10/15/2013   Procedure: MULTIPLE EXTRACTION WITH ALVEOLOPLASTY;  Surgeon: Gae Bon, DDS;  Location: [facility];  Service: Oral Surgery;  Laterality: N/A;  . POLYPECTOMY N/A 03/08/2013   Procedure: POLYPECTOMY;  Surgeon: Daneil Dolin, MD;  [facility];  Service: Endoscopy;  Laterality: N/A;  . POLYPECTOMY N/A 05/11/2016   Procedure: REMOVAL OF ENDOMETRIAL POLYP;  Surgeon: Jonnie Kind, MD;  [facility];  Service: Gynecology;  Laterality: N/A;  .  TOTAL KNEE ARTHROPLASTY Left 02/05/2015   Procedure: LEFT TOTAL KNEE ARTHROPLASTY;  Surgeon: Carole Civil, MD;  [facility];  Service: Orthopedics;  Laterality: Left;    There were no vitals filed for this visit.  Subjective Assessment - 08/03/18 1653    Subjective   S: Pain started in August. I don't remember doing anything.     Pertinent History  Patient is a 63 y/o female S/P right shoulder pain with no known injury. Pain began in August of 2019. Patient has received two shots for pain from Dr. Aline Brochure. Patient reports short term relief although experiences a high level of pain when not taking her pain medication. Dr. Aline Brochure has referred patient to Occupational therapy for evaluation and treatment. He would like to follow up with patient in 6 weeks. If no improvement is seen with therapy, he will plan on doing a MRI.     Special Tests  FOTO score: 51/100    Patient Stated Goals  To decrease pain.    Currently in Pain?  Yes    Pain Score  8     Pain Location  Shoulder    Pain Orientation  Right    Pain Descriptors / Indicators  Sharp    Pain Type  Acute pain    Pain Radiating Towards  up into neck    Pain Onset  More than a month ago    Pain Frequency  Constant    Aggravating Factors   Using it and movement, wrong movement    Pain Relieving Factors  heating pad, prescription pain medications    Effect of Pain on Daily Activities  Severe effect    Multiple Pain Sites  No        [facility] OT Assessment - 08/03/18 1638      Assessment   Medical Diagnosis  right shoulder pain    Referring Provider (OT)  Dr. Arther Abbott    Onset Date/Surgical Date  --   August 2019   Hand Dominance  Right    Next MD Visit  08/30/18    Prior Therapy  None for her shoulder      Precautions   Precautions  None      Restrictions   Weight Bearing Restrictions  No      Balance Screen   Has the patient fallen in the past 6 months  No      Home  Environment   Family/patient expects to  be discharged to:  Private residence      Prior Function   Level of Independence  Independent    Vocation  On disability    Leisure  Pt enjoys cleaning her house and gardening/ taking care of her flowers.      ADL   ADL comments  Pt has difficulty with all shoulder movements. Difficulty reaching to shoulder level and up. Unable to lift heavy items. She reports her pain level is usually at an 8/10 or higher.  Mobility   Mobility Status  Independent      Written Expression   Dominant Hand  Right      Vision - History   Baseline Vision  Wears glasses only for reading      Cognition   Overall Cognitive Status  Within Functional Limits for tasks assessed      Observation/Other Assessments   Focus on Therapeutic Outcomes (FOTO)   51/100      Posture/Postural Control   Posture/Postural Control  Postural limitations    Postural Limitations  Rounded Shoulders;Forward head      ROM / Strength   AROM / PROM / Strength  Strength;PROM;AROM      Palpation   Palpation comment  Max fascial restrictions in the right upper arm, trapezius, and scapularis region      AROM   Overall AROM Comments  Assessed seated. IR/er adducted    AROM Assessment Site  Shoulder    Right/Left Shoulder  Right    Right Shoulder Flexion  90 Degrees   left: 150   Right Shoulder ABduction  125 Degrees   left: 150   Right Shoulder Internal Rotation  90 Degrees   left: 90   Right Shoulder External Rotation  50 Degrees   left: 90     PROM   Overall PROM Comments  Assessed supine. IR/er adducted.    PROM Assessment Site  Shoulder    Right/Left Shoulder  Right    Right Shoulder Flexion  127 Degrees    Right Shoulder ABduction  180 Degrees    Right Shoulder Internal Rotation  90 Degrees    Right Shoulder External Rotation  80 Degrees      Strength   Overall Strength Comments  Assessed IR/er adducted    Strength Assessment Site  Shoulder    Right/Left Shoulder  Right    Right Shoulder Flexion  3-/5     Right Shoulder ABduction  3/5    Right Shoulder Internal Rotation  4/5    Right Shoulder External Rotation  3-/5                      OT Education - 08/03/18 1740    Education Details  table slides, A/ROM scapular exercises: extension and row    Person(s) Educated  Patient    Methods  Explanation;Demonstration;Verbal cues;Handout    Comprehension  Verbalized understanding;Returned demonstration       OT Short Term Goals - 08/03/18 1835      OT SHORT TERM GOAL #1   Title  Patient will be educated and independent with HEP to faciliate progress in therapy and allow her to return to using her RUE as her dominant extremity.     Time  3    Period  Weeks    Status  New    Target Date  08/24/18      OT SHORT TERM GOAL #2   Title  Patient will increase RUE P/ROM to WNL in order to increase ability to complete dressing tasks with less difficulty.     Time  3    Period  Weeks    Status  New      OT SHORT TERM GOAL #3   Title  Patient will report a decrease in pain level of approximately 6/10 while completing simple household tasks with her RUE.     Time  3    Period  Weeks    Status  New  OT Long Term Goals - 08/03/18 1836      OT LONG TERM GOAL #1   Title  Patient will increase her RUE A/ROM to WNL or equal to her left UE in order to reach into cabinets or above shoulder level with less difficulty.     Time  6    Period  Weeks    Status  New    Target Date  09/14/18      OT LONG TERM GOAL #2   Title  Patient will increase her RUE strength to 4/5 overall in order to return to flower and gardening activities with increased comfort.    Time  6    Period  Weeks    Status  New      OT LONG TERM GOAL #3   Title  Patient will decrease her fascial restrictions to min amount or less in order to increaser functional mobility needed to complete reaching tasks.     Time  6    Period  Weeks    Status  New      OT LONG TERM GOAL #4   Title  Patient will  report a decrease in pain of approximately 3/10 or less in her RUE while completing household tasks with her Right arm.    Baseline  6    Period  Weeks    Status  New            Plan - 08/03/18 1832    Clinical Impression Statement  A: Patient is a 63 y/o female S/P right shoulder pain causing increased fascial restrictions and decreased strength and ROM resulting in difficulty completing daily tasks using her RUE as her dominant extremity.     Occupational Profile and client history currently impacting functional performance  Patient is motivated to return to prior level of independence.     Occupational performance deficits (Please refer to evaluation for details):  ADL's;Rest and Sleep;Leisure;IADL's    Rehab Potential  Good    Current Impairments/barriers affecting progress:  None    OT Frequency  2x / week    OT Duration  6 weeks    OT Treatment/Interventions  Self-care/ADL training;Moist Heat;DME and/or AE instruction;Therapeutic activities;Ultrasound;Therapeutic exercise;Passive range of motion;Neuromuscular education;Cryotherapy;Electrical Stimulation;Manual Therapy;Patient/family education    Plan  P: Patient will benefit from skilled OT services to increase functional performance while using her RUE as her dominant extremity for all tasks. Treatment Plan: Myofascial release, manual stretching, P/ROM, AA/ROM, A/ROM, general shoulder and scapular strengthening. Modalities PRN.     Clinical Decision Making  Several treatment options, min-mod task modification necessary    Consulted and Agree with Plan of Care  Patient       Patient will benefit from skilled therapeutic intervention in order to improve the following deficits and impairments:  Decreased range of motion, Increased fascial restrictions, Impaired UE functional use, Pain, Decreased strength  Visit Diagnosis: Chronic right shoulder pain - Plan: Ot plan of care cert/re-cert  Stiffness of right shoulder, not elsewhere  classified - Plan: Ot plan of care cert/re-cert  Other symptoms and signs involving the musculoskeletal system - Plan: Ot plan of care cert/re-cert    Problem List Patient Active Problem List   Diagnosis Date Noted  . S/P total knee replacement, 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/24/2018  . Shoulder impingement, right 05/24/2018  . Dehydration   . Intractable cyclical vomiting 89/38/1017  . Pyloric stenosis in adult   . Thrombocytopenia ([place]) 12/20/2017  . Leukocytosis   . Malnutrition  of moderate degree 12/17/2017  . Acute renal failure (ARF) ([place]) 12/15/2017  . Chronic abdominal pain 12/15/2017  . Gastroenteritis 06/14/2016  . Nausea with vomiting 06/10/2016  . Uterine enlargement 06/09/2016  . Intractable nausea and vomiting 06/08/2016  . Acute infective gastroenteritis 06/08/2016  . Diarrhea 06/08/2016  . Essential hypertension 06/08/2016  . GERD (gastroesophageal reflux disease) 06/08/2016  . Abdominal pain   . Endometrial polyp 04/15/2016  . PMB (postmenopausal bleeding) 02/27/2016  . Alcoholic cirrhosis of liver without ascites ([place])   . Asthma 01/21/2016  . Hepatic cirrhosis ([place]) 09/22/2015  . Arthritis of knee, degenerative 02/05/2015  . History of Helicobacter pylori infection 10/30/2014  . Chronic [name] with cirrhosis ([place]) 04/11/2014  . De Quervain's disease (radial styloid tenosynovitis) 12/25/2013  . Anorexia 11/21/2012  . FH: colon cancer 11/21/2012  . Early satiety 10/25/2012  . Bowel habit changes 10/25/2012  . Abdominal pain, epigastric 10/25/2012  . Abdominal bloating 10/25/2012  . Constipation 10/25/2012  . Abnormal weight loss 10/25/2012  . Chronic viral [name] ([place]) 10/25/2012  . Radicular pain of left lower extremity 09/28/2012  . Back pain 09/28/2012  . Sciatica 08/10/2011  . S/P arthroscopy of left knee 08/10/2011  . Tibial plateau fracture 08/10/2011  . Pain in joint, lower leg 02/12/2011  . Stiffness of joint, not elsewhere classified, lower leg  02/12/2011  . 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 Meniscus, medial, derangement 12/29/2010  . CLOSED FRACTURE OF UPPER END OF TIBIA 08/12/2010   Ailene Ravel, OTR/L,CBIS  [phone]  08/03/2018, 6:41 PM  [facility] [address] [place], [place], [address] Phone: [phone]   Fax:  [phone]  Name: LOVEDA COLAIZZI MRN: [id number] Date of Birth: March 16, 1956

## 2018-08-09 DIAGNOSIS — Z79891 Long term (current) use of opiate analgesic: Secondary | ICD-10-CM | POA: Diagnosis not present

## 2018-08-09 DIAGNOSIS — M13862 Other specified arthritis, left knee: Secondary | ICD-10-CM | POA: Diagnosis not present

## 2018-08-09 DIAGNOSIS — G894 Chronic pain syndrome: Secondary | ICD-10-CM | POA: Diagnosis not present

## 2018-08-09 DIAGNOSIS — M461 Sacroiliitis, not elsewhere classified: Secondary | ICD-10-CM | POA: Diagnosis not present

## 2018-08-14 ENCOUNTER — Ambulatory Visit (HOSPITAL_COMMUNITY): Payer: Medicare Other | Admitting: Specialist

## 2018-08-14 ENCOUNTER — Encounter (HOSPITAL_COMMUNITY): Payer: Self-pay | Admitting: Specialist

## 2018-08-14 DIAGNOSIS — G8929 Other chronic pain: Secondary | ICD-10-CM | POA: Diagnosis not present

## 2018-08-14 DIAGNOSIS — M25611 Stiffness of right shoulder, not elsewhere classified: Secondary | ICD-10-CM | POA: Diagnosis not present

## 2018-08-14 DIAGNOSIS — M25511 Pain in right shoulder: Secondary | ICD-10-CM | POA: Diagnosis not present

## 2018-08-14 DIAGNOSIS — R29898 Other symptoms and signs involving the musculoskeletal system: Secondary | ICD-10-CM

## 2018-08-14 DIAGNOSIS — D229 Melanocytic nevi, unspecified: Secondary | ICD-10-CM | POA: Diagnosis not present

## 2018-08-14 DIAGNOSIS — J441 Chronic obstructive pulmonary disease with (acute) exacerbation: Secondary | ICD-10-CM | POA: Diagnosis not present

## 2018-08-14 NOTE — Therapy (Signed)
[place], [place], [address] Phone: [phone]   Fax:  [phone]  Occupational Therapy Treatment  Patient Details  Name: Colleen Lee MRN: [id number] Date of Birth: Aug 29, 1955 Referring Provider (OT): Dr. Arther Abbott   Encounter Date: 08/14/2018  OT End of Session - 08/14/18 1420    Visit Number  2    Number of Visits  12    Date for OT Re-Evaluation  09/14/18   mini reassess on 08/28/2018   Authorization Type  Medicare. no visit limit    OT Start Time  1349    OT Stop Time  1428    OT Time Calculation (min)  39 min    Activity Tolerance  Patient tolerated treatment well    Behavior During Therapy  WFL for tasks assessed/performed       Past Medical History:  Diagnosis Date  . Asthma   . Chronic abdominal pain   . Chronic back pain   . Chronic constipation   . Cirrhosis ([place])    Metavir score F4 on elastography 2015  . Cyclical vomiting   . Fibroids   . Fibromyalgia   . GERD (gastroesophageal reflux disease)   . H. pylori infection 2014   treated with pylera, had to be treated again as it was not eradicated. Urea breath test then negative after subsequent treatment.   . [name]    HCV RNA positive 09/2012  . Hypertension   . Marijuana use   . Nausea and vomiting    chronic, recurrent  . PONV (postoperative nausea and vomiting)    pt thinks maybe once she had N&V  . Sciatica of left side     Past Surgical History:  Procedure Laterality Date  . BALLOON DILATION  12/20/2017   Procedure: BALLOON DILATION;  Surgeon: Danie Binder, MD;  [facility];  Service: Endoscopy;;  pyloric dilation  . BIOPSY  12/20/2017   Procedure: BIOPSY;  Surgeon: Danie Binder, MD;  [facility];  Service: Endoscopy;;  duodenal and gastric biopsy  . COLONOSCOPY  01/18/2008   XBD:[id number] rectum.  Long redundant colon, a diminutive sigmoid polyp status post cold biopsy removed.  Hyperplastic polyp. Repeat colonoscopy June 2014 due to family history of colon cancer  . COLONOSCOPY WITH ESOPHAGOGASTRODUODENOSCOPY (EGD) N/A 11/02/2012   EQA:[id number] gastric mucosa of doubtful, +H.pylori. Incomplete colonoscopy due to patient unable to tolerate exam, proximal colon seen. Patient refused ACBE.  Marland Kitchen COLONOSCOPY WITH PROPOFOL N/A 03/08/2013   Dr. Gala Romney: colonic polyp-removed as scribed above. Internal Hemorrhoids. Pathology did not reveal any colonic tissue, only mucus. SURVEILLANCE DUE Aug 2019  . COLONOSCOPY WITH PROPOFOL N/A 01/19/2018   Dr. Gala Romney: 6 mm cecal inflammatoy polyp, otherwise normal., Surveilance in 5 year s  . ESOPHAGEAL DILATION  12/20/2017   Procedure: ESOPHAGEAL DILATION;  Surgeon: Danie Binder, MD;  [facility];  Service: Endoscopy;;  . ESOPHAGOGASTRODUODENOSCOPY  01/18/2008   RMR: Normal esophagus, normal  stomach  . ESOPHAGOGASTRODUODENOSCOPY (EGD) WITH PROPOFOL N/A 02/09/2016   Normal esophagus, small hiatal hernia, portal hypertensive gastropathy, normal second portion of duodenum. Due in July 2019  . ESOPHAGOGASTRODUODENOSCOPY (EGD) WITH PROPOFOL N/A 12/20/2017   Dr. Oneida Alar: benign appearing esophageal stenosis s/p dilation, mild pyloric stenosis s/p biopsy and dilation, mild duodenitis.   Marland Kitchen HYSTEROSCOPY  05/11/2016   Procedure: HYSTEROSCOPY;  Surgeon: Jonnie Kind, MD;  [facility];  Service: Gynecology;;  . KNEE SURGERY  left knee  . MULTIPLE EXTRACTIONS WITH ALVEOLOPLASTY N/A 10/15/2013   Procedure: MULTIPLE EXTRACTION WITH ALVEOLOPLASTY;  Surgeon: Gae Bon, DDS;  Location: [facility];  Service: Oral Surgery;  Laterality: N/A;  . POLYPECTOMY N/A 03/08/2013   Procedure: POLYPECTOMY;  Surgeon: Daneil Dolin, MD;  [facility];  Service: Endoscopy;  Laterality: N/A;  . POLYPECTOMY N/A 05/11/2016   Procedure: REMOVAL OF ENDOMETRIAL POLYP;  Surgeon: Jonnie Kind, MD;  [facility];  Service: Gynecology;  Laterality: N/A;  .  TOTAL KNEE ARTHROPLASTY Left 02/05/2015   Procedure: LEFT TOTAL KNEE ARTHROPLASTY;  Surgeon: Carole Civil, MD;  [facility];  Service: Orthopedics;  Laterality: Left;    There were no vitals filed for this visit.  Subjective Assessment - 08/14/18 1419    Subjective   S:  I am really busy taking care of my husband.  Sometimes I can't get to all of the exercises.     Currently in Pain?  Yes    Pain Score  3     Pain Location  Shoulder    Pain Orientation  Right    Pain Descriptors / Indicators  Aching         [facility] OT Assessment - 08/14/18 0001      Assessment   Medical Diagnosis  right shoulder pain    Referring Provider (OT)  Dr. Arther Abbott               OT Treatments/Exercises (OP) - 08/14/18 0001      Exercises   Exercises  Shoulder      Shoulder Exercises: Supine   Protraction  PROM;5 reps;AAROM;10 reps    Horizontal ABduction  PROM;5 reps;AAROM;10 reps    External Rotation  PROM;5 reps;AAROM;10 reps    Internal Rotation  PROM;5 reps;AAROM;10 reps    Flexion  PROM;5 reps;AAROM;10 reps    ABduction  PROM;5 reps;AAROM;10 reps      Shoulder Exercises: Seated   Elevation  AROM;10 reps    Extension  AROM;10 reps    Retraction  AROM;10 reps      Shoulder Exercises: Therapy Ball   Flexion  10 reps    ABduction  10 reps      Shoulder Exercises: ROM/Strengthening   Wall Wash  1 min max vg    Thumb Tacks  1 min max vg and tactile cues    Prot/Ret//Elev/Dep  1 min max vg and tactile cues       Manual Therapy   Manual Therapy  Myofascial release    Manual therapy comments  manual therapy completed seperately from all other interventions this date.    Myofascial Release  myofascial release and manual stretching to right upper arm, shoulder, and scapular region to decrease pain and improve pain free mobilty in right shoulder region.                OT Short Term Goals - 08/14/18 1434      OT SHORT TERM GOAL #1   Title  Patient will be  educated and independent with HEP to faciliate progress in therapy and allow her to return to using her RUE as her dominant extremity.     Time  3    Period  Weeks    Status  On-going      OT SHORT TERM GOAL #2   Title  Patient will increase RUE P/ROM to WNL in order to increase ability to complete dressing tasks with less difficulty.  Time  3    Period  Weeks    Status  On-going      OT SHORT TERM GOAL #3   Title  Patient will report a decrease in pain level of approximately 6/10 while completing simple household tasks with her RUE.     Time  3    Period  Weeks    Status  On-going        OT Long Term Goals - 08/14/18 1434      OT LONG TERM GOAL #1   Title  Patient will increase her RUE A/ROM to WNL or equal to her left UE in order to reach into cabinets or above shoulder level with less difficulty.     Time  6    Period  Weeks    Status  On-going      OT LONG TERM GOAL #2   Title  Patient will increase her RUE strength to 4/5 overall in order to return to flower and gardening activities with increased comfort.    Time  6    Period  Weeks    Status  On-going      OT LONG TERM GOAL #3   Title  Patient will decrease her fascial restrictions to min amount or less in order to increaser functional mobility needed to complete reaching tasks.     Time  6    Period  Weeks    Status  On-going      OT LONG TERM GOAL #4   Title  Patient will report a decrease in pain of approximately 3/10 or less in her RUE while completing household tasks with her Right arm.    Baseline  6    Period  Weeks    Status  On-going            Plan - 08/14/18 1423    Clinical Impression Statement  A: patient has max fascial restrictions in her right upper arm, scapular and shoulder region. good vasomotor response to manual therapy intervention.  patient requires max vg and tactile cues to complete therapeutic exercises with correct form.     Plan  P:  complete shoulder exercises with greater  independence, less facilitation and verbal guidance.  attempt aa/rom in seated, in addition to supine.        Patient will benefit from skilled therapeutic intervention in order to improve the following deficits and impairments:  Decreased range of motion, Increased fascial restrictions, Impaired UE functional use, Pain, Decreased strength  Visit Diagnosis: Chronic right shoulder pain  Stiffness of right shoulder, not elsewhere classified  Other symptoms and signs involving the musculoskeletal system    Problem List Patient Active Problem List   Diagnosis Date Noted  . S/P total knee replacement, 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/24/2018  . Shoulder impingement, right 05/24/2018  . Dehydration   . Intractable cyclical vomiting 52/77/8242  . Pyloric stenosis in adult   . Thrombocytopenia ([place]) 12/20/2017  . Leukocytosis   . Malnutrition of moderate degree 12/17/2017  . Acute renal failure (ARF) ([place]) 12/15/2017  . Chronic abdominal pain 12/15/2017  . Gastroenteritis 06/14/2016  . Nausea with vomiting 06/10/2016  . Uterine enlargement 06/09/2016  . Intractable nausea and vomiting 06/08/2016  . Acute infective gastroenteritis 06/08/2016  . Diarrhea 06/08/2016  . Essential hypertension 06/08/2016  . GERD (gastroesophageal reflux disease) 06/08/2016  . Abdominal pain   . Endometrial polyp 04/15/2016  . PMB (postmenopausal bleeding) 02/27/2016  . Alcoholic cirrhosis of liver without ascites ([place])   .  Asthma 01/21/2016  . Hepatic cirrhosis ([place]) 09/22/2015  . Arthritis of knee, degenerative 02/05/2015  . History of Helicobacter pylori infection 10/30/2014  . Chronic [name] with cirrhosis ([place]) 04/11/2014  . De Quervain's disease (radial styloid tenosynovitis) 12/25/2013  . Anorexia 11/21/2012  . FH: colon cancer 11/21/2012  . Early satiety 10/25/2012  . Bowel habit changes 10/25/2012  . Abdominal pain, epigastric 10/25/2012  . Abdominal bloating 10/25/2012  . Constipation 10/25/2012   . Abnormal weight loss 10/25/2012  . Chronic viral [name] ([place]) 10/25/2012  . Radicular pain of left lower extremity 09/28/2012  . Back pain 09/28/2012  . Sciatica 08/10/2011  . S/P arthroscopy of left knee 08/10/2011  . Tibial plateau fracture 08/10/2011  . Pain in joint, lower leg 02/12/2011  . Stiffness of joint, not elsewhere classified, lower leg 02/12/2011  . 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 Meniscus, medial, derangement 12/29/2010  . CLOSED FRACTURE OF UPPER END OF TIBIA 08/12/2010    Vangie Bicker, [place], OTR/L [phone]  08/14/2018, 4:30 PM  [place], [place], [address] Phone: [phone]   Fax:  [phone]  Name: KASHAUNA CELMER MRN: [id number] Date of Birth: July 23, 1956

## 2018-08-17 ENCOUNTER — Telehealth (HOSPITAL_COMMUNITY): Payer: Self-pay

## 2018-08-17 ENCOUNTER — Ambulatory Visit (HOSPITAL_COMMUNITY): Payer: Medicare Other

## 2018-08-17 NOTE — Telephone Encounter (Signed)
Patient cancel she is not feeling well.

## 2018-08-18 ENCOUNTER — Telehealth (HOSPITAL_COMMUNITY): Payer: Self-pay

## 2018-08-18 NOTE — Telephone Encounter (Signed)
L/m to cx or r/s 09/08/18  apptment, ask pt to call us back and let us know what they want to do. NF1/17/2020

## 2018-08-22 IMAGING — CT CT ABD-PELV W/ CM
2 of 5 series · 16 of 46 positions shown, 18 images · IV contrast (iopamidol)
Comparison: 08/08/2016.

CLINICAL DATA: RUQ and LUQ pain that is sharp. Associated symptoms
include diarrhea, nausea and vomiting. Hx HTN, Hep C, GERD,
cirrhosis and asthma.

EXAM:
CT ABDOMEN AND PELVIS WITH CONTRAST
TECHNIQUE: Multidetector CT imaging of the abdomen and pelvis was performed
using the standard protocol following bolus administration of
intravenous contrast.
CONTRAST:  30mL DRK76N-166 IOPAMIDOL (DRK76N-166) INJECTION 61%,
100mL DRK76N-166 IOPAMIDOL (DRK76N-166) INJECTION 61%

[Series 2: axial st · axial · 0.78mm/px · z∈[-416,-40]mm · 13 of 87 slices shown, 15 images]
[im 6/87  soft-tissue]
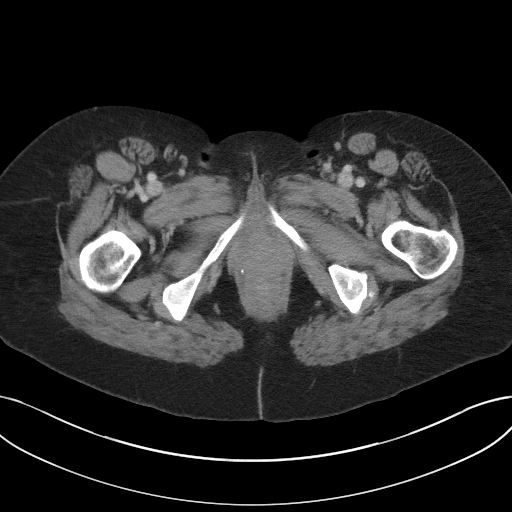
[im 6/87  bone]
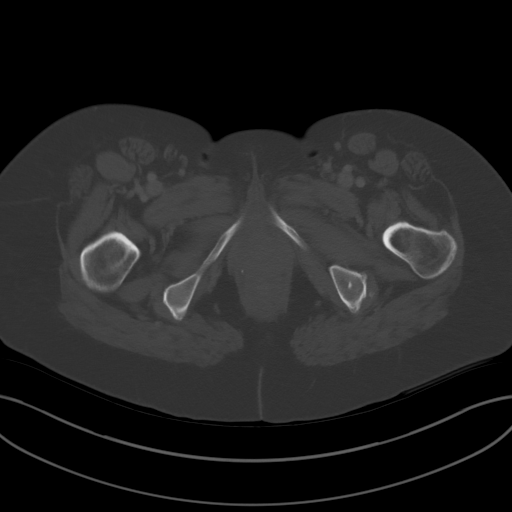
[im 11/87  soft-tissue]
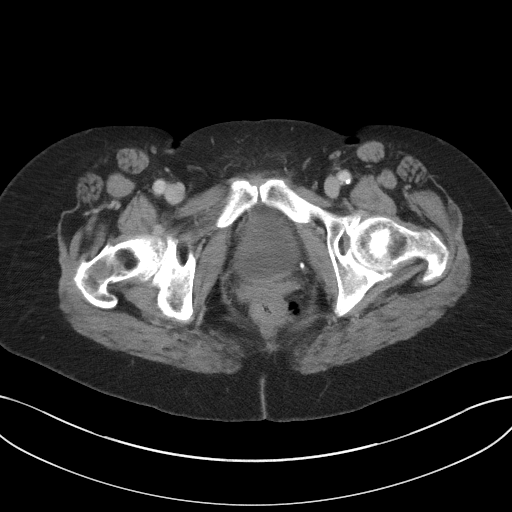
[im 21/87  soft-tissue]
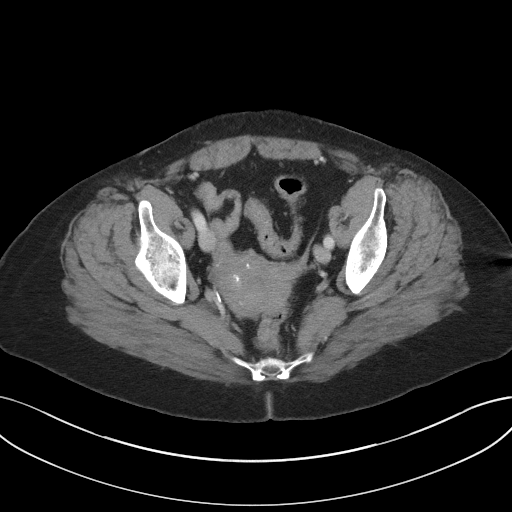
[im 26/87  soft-tissue]
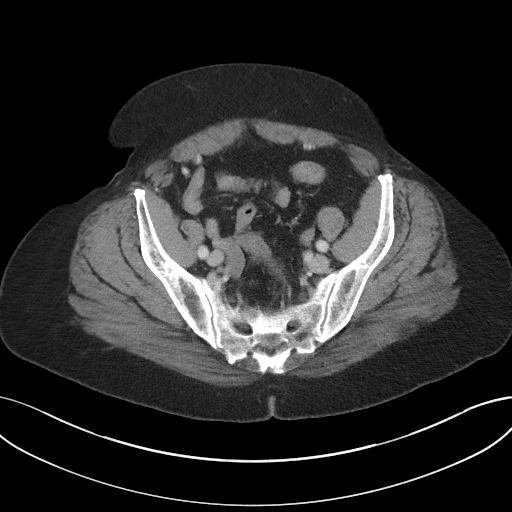
[im 31/87  soft-tissue]
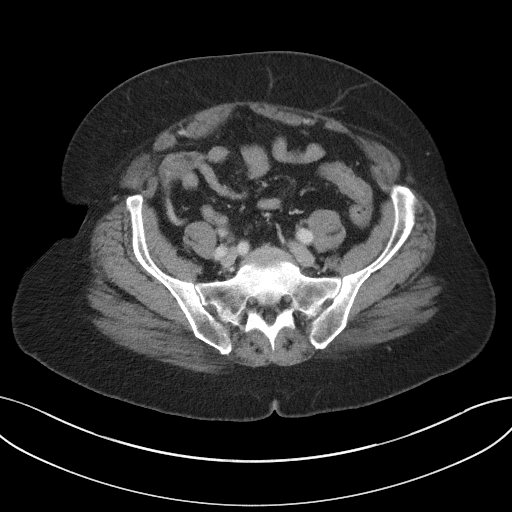
[im 36/87  soft-tissue]
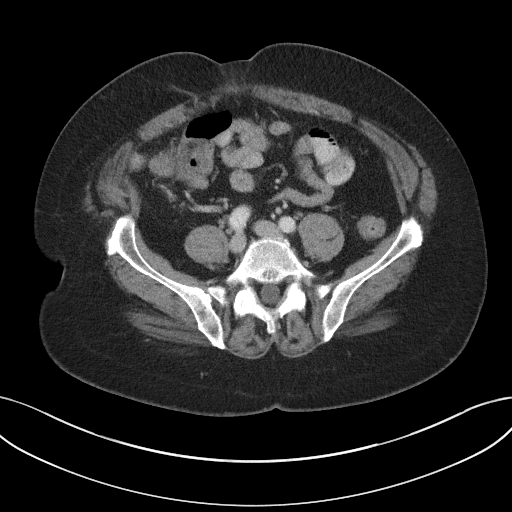
[im 46/87  soft-tissue]
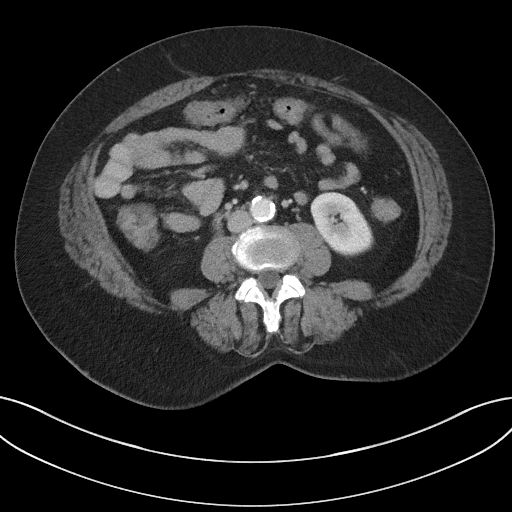
[im 51/87  soft-tissue]
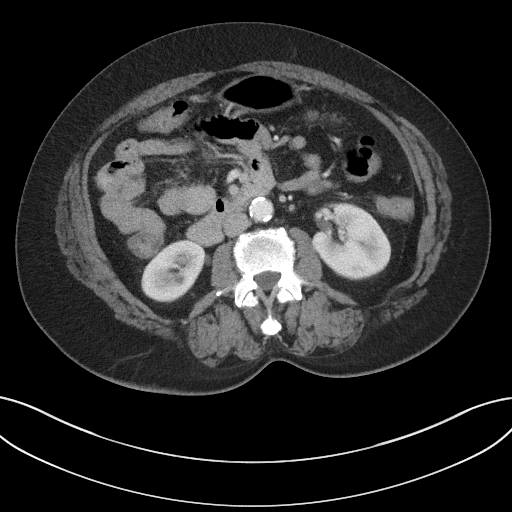
[im 56/87  soft-tissue]
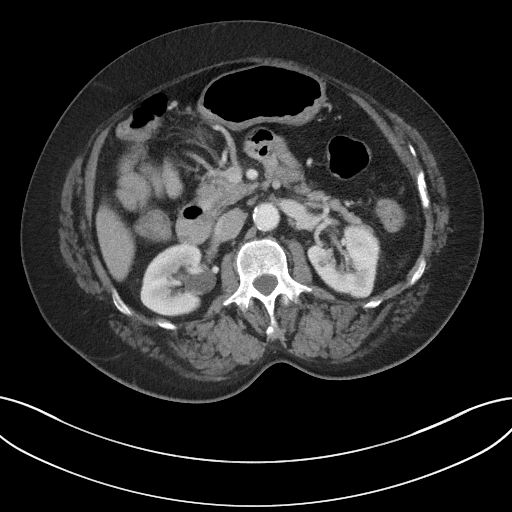
[im 56/87  bone]
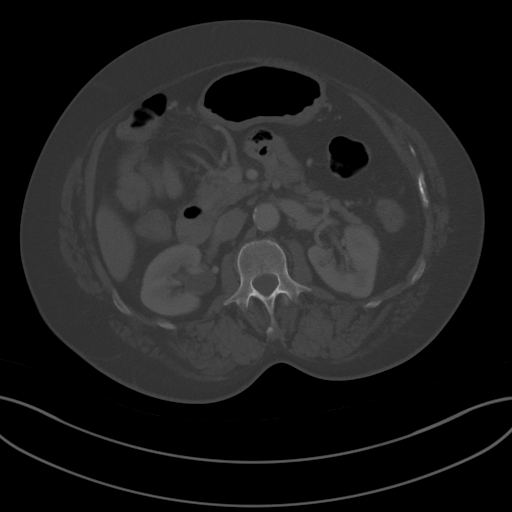
[im 61/87  soft-tissue]
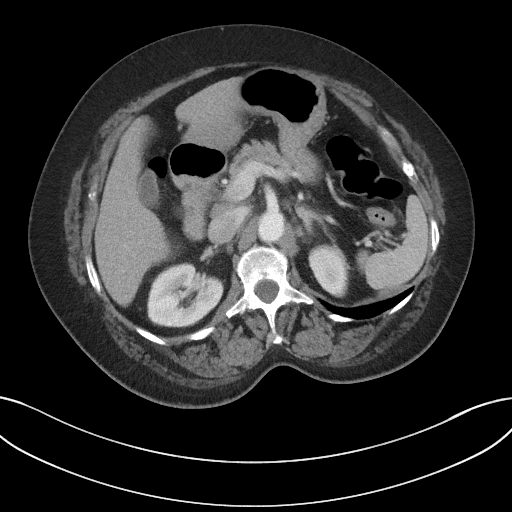
[im 66/87  soft-tissue]
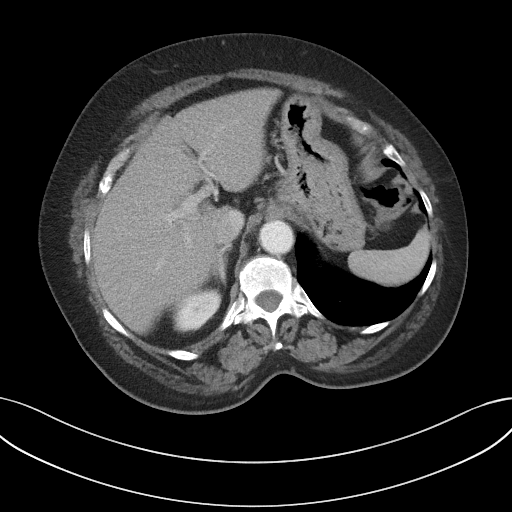
[im 76/87  soft-tissue]
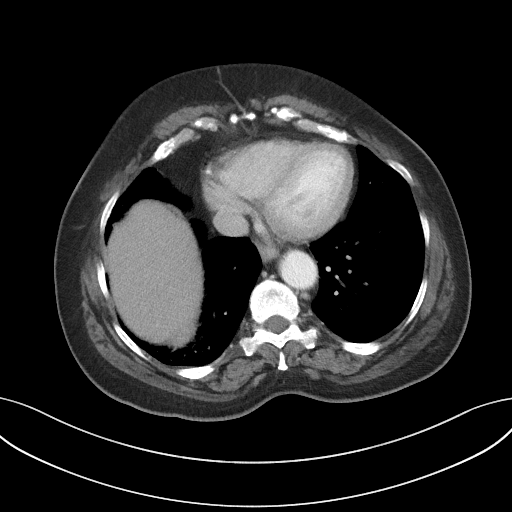
[im 81/87  soft-tissue]
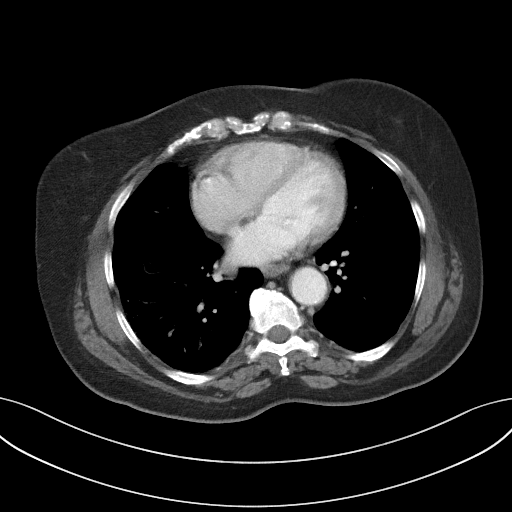

[Series 3: coronal st · coronal · 0.69mm/px · 3 of 96 slices shown]
[im 32/96  soft-tissue]
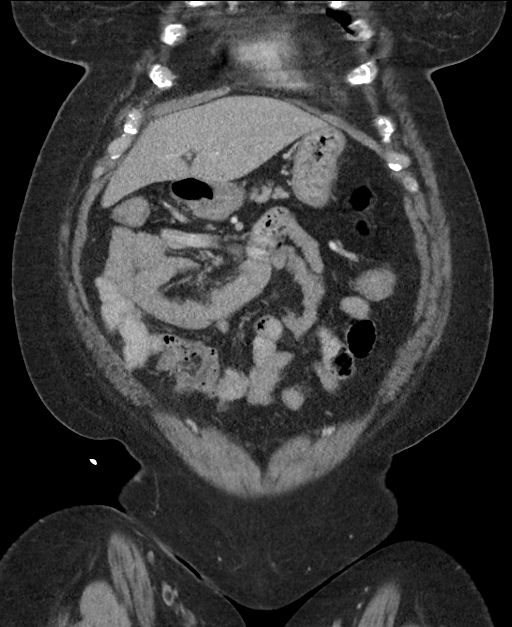
[im 43/96  soft-tissue]
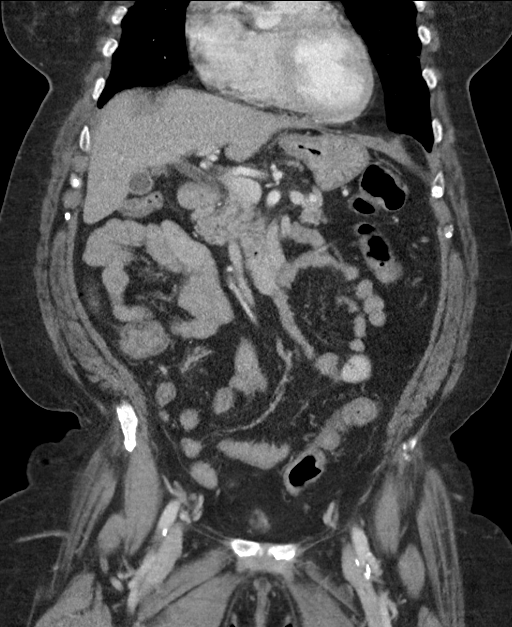
[im 53/96  soft-tissue]
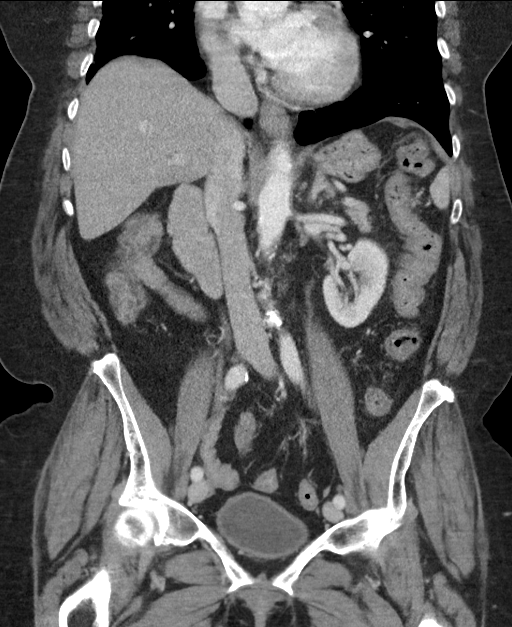

[16 of 46 positions shown; findings below may reference images not displayed]

FINDINGS: Lower chest: No acute abnormality.

Hepatobiliary: Liver normal in size with no mass or focal lesion.
Gallbladder is unremarkable. Common bile duct measures 7 mm, similar
to the prior CT. No evidence of a duct stone.

Pancreas: Unremarkable. No pancreatic ductal dilatation or
surrounding inflammatory changes.

Spleen: Normal in size without focal abnormality.

Adrenals/Urinary Tract: Adrenal glands are unremarkable. Kidneys are
normal, without renal calculi, focal lesion, or hydronephrosis.
Bladder is unremarkable.

Stomach/Bowel: Stomach is within normal limits. Appendix appears
normal. No evidence of bowel wall thickening, distention, or
inflammatory changes.

Vascular/Lymphatic: Aortic atherosclerosis. No enlarged abdominal or
pelvic lymph nodes.

Reproductive: Partly calcified uterine fibroids are stable. No
adnexal masses.

Other: Small fat containing umbilical hernia. No other hernias. No
ascites.

Musculoskeletal: No acute or significant osseous findings.
IMPRESSION: 1. No acute findings.
2. No bowel obstruction or inflammation. Normal appendix visualized.

## 2018-08-23 DIAGNOSIS — L720 Epidermal cyst: Secondary | ICD-10-CM | POA: Diagnosis not present

## 2018-08-24 ENCOUNTER — Ambulatory Visit (HOSPITAL_COMMUNITY): Payer: Medicare Other

## 2018-08-24 ENCOUNTER — Encounter (HOSPITAL_COMMUNITY): Payer: Self-pay

## 2018-08-24 DIAGNOSIS — M25611 Stiffness of right shoulder, not elsewhere classified: Secondary | ICD-10-CM

## 2018-08-24 DIAGNOSIS — M25511 Pain in right shoulder: Secondary | ICD-10-CM | POA: Diagnosis not present

## 2018-08-24 DIAGNOSIS — R29898 Other symptoms and signs involving the musculoskeletal system: Secondary | ICD-10-CM

## 2018-08-24 DIAGNOSIS — G8929 Other chronic pain: Secondary | ICD-10-CM

## 2018-08-24 NOTE — Patient Instructions (Signed)
Repeat all exercises 10-15 times, 1-2 times per day.  1) Shoulder Protraction    Begin with elbows by your side, slowly "punch" straight out in front of you.      2) Shoulder Flexion  Standing:         Begin with arms at your side with thumbs pointed up, slowly raise both arms up and forward towards overhead.         3) Horizontal abduction/adduction    Standing:           Begin with arms straight out in front of you, bring out to the side in at "T" shape. Keep arms straight entire time.         4) Internal & External Rotation    *No band* -Stand with elbows at the side and elbows bent 90 degrees. Move your forearms away from your body, then bring back inward toward the body.     5) Shoulder Abduction  Standing:       begin with your arms flat on the table next to your side. Slowly move your arms out to the side so that they go overhead, in a jumping jack or snow angel movement.

## 2018-08-24 NOTE — Therapy (Signed)
[place], [place], [address] Phone: [phone]   Fax:  [phone]  Occupational Therapy Treatment  Patient Details  Name: Colleen Lee MRN: [id number] Date of Birth: 06-Jan-1956 Referring Provider (OT): Dr. Arther Abbott   Encounter Date: 08/24/2018  OT End of Session - 08/24/18 1106    Visit Number  3    Number of Visits  12    Date for OT Re-Evaluation  09/14/18   mini reassess on 08/28/2018   Authorization Type  Medicare. no visit limit    OT Start Time  1041   Pt used the bathroom prior to starting session.    OT Stop Time  1115    OT Time Calculation (min)  34 min    Activity Tolerance  Patient tolerated treatment well    Behavior During Therapy  WFL for tasks assessed/performed       Past Medical History:  Diagnosis Date  . Asthma   . Chronic abdominal pain   . Chronic back pain   . Chronic constipation   . Cirrhosis ([place])    Metavir score F4 on elastography 2015  . Cyclical vomiting   . Fibroids   . Fibromyalgia   . GERD (gastroesophageal reflux disease)   . H. pylori infection 2014   treated with pylera, had to be treated again as it was not eradicated. Urea breath test then negative after subsequent treatment.   . [name]    HCV RNA positive 09/2012  . Hypertension   . Marijuana use   . Nausea and vomiting    chronic, recurrent  . PONV (postoperative nausea and vomiting)    pt thinks maybe once she had N&V  . Sciatica of left side     Past Surgical History:  Procedure Laterality Date  . BALLOON DILATION  12/20/2017   Procedure: BALLOON DILATION;  Surgeon: Danie Binder, MD;  [facility];  Service: Endoscopy;;  pyloric dilation  . BIOPSY  12/20/2017   Procedure: BIOPSY;  Surgeon: Danie Binder, MD;  [facility];  Service: Endoscopy;;  duodenal and gastric biopsy  . COLONOSCOPY  01/18/2008   DPO:EUMPNT rectum.  Long redundant colon, a diminutive  sigmoid polyp status post cold biopsy removed. Hyperplastic polyp. Repeat colonoscopy June 2014 due to family history of colon cancer  . COLONOSCOPY WITH ESOPHAGOGASTRODUODENOSCOPY (EGD) N/A 11/02/2012   IRW:ERXVQMGQ gastric mucosa of doubtful, +H.pylori. Incomplete colonoscopy due to patient unable to tolerate exam, proximal colon seen. Patient refused ACBE.  Marland Kitchen COLONOSCOPY WITH PROPOFOL N/A 03/08/2013   Dr. Gala Romney: colonic polyp-removed as scribed above. Internal Hemorrhoids. Pathology did not reveal any colonic tissue, only mucus. SURVEILLANCE DUE Aug 2019  . COLONOSCOPY WITH PROPOFOL N/A 01/19/2018   Dr. Gala Romney: 6 mm cecal inflammatoy polyp, otherwise normal., Surveilance in 5 year s  . ESOPHAGEAL DILATION  12/20/2017   Procedure: ESOPHAGEAL DILATION;  Surgeon: Danie Binder, MD;  [facility];  Service: Endoscopy;;  . ESOPHAGOGASTRODUODENOSCOPY  01/18/2008   RMR: Normal esophagus, normal  stomach  . ESOPHAGOGASTRODUODENOSCOPY (EGD) WITH PROPOFOL N/A 02/09/2016   Normal esophagus, small hiatal hernia, portal hypertensive gastropathy, normal second portion of duodenum. Due in July 2019  . ESOPHAGOGASTRODUODENOSCOPY (EGD) WITH PROPOFOL N/A 12/20/2017   Dr. Oneida Alar: benign appearing esophageal stenosis s/p dilation, mild pyloric stenosis s/p biopsy and dilation, mild duodenitis.   Marland Kitchen HYSTEROSCOPY  05/11/2016   Procedure: HYSTEROSCOPY;  Surgeon: Jonnie Kind, MD;  Location: [facility];  Service: Gynecology;;  . KNEE SURGERY     left knee  . MULTIPLE EXTRACTIONS WITH ALVEOLOPLASTY N/A 10/15/2013   Procedure: MULTIPLE EXTRACTION WITH ALVEOLOPLASTY;  Surgeon: Gae Bon, DDS;  Location: [facility];  Service: Oral Surgery;  Laterality: N/A;  . POLYPECTOMY N/A 03/08/2013   Procedure: POLYPECTOMY;  Surgeon: Daneil Dolin, MD;  Location: [facility];  Service: Endoscopy;  Laterality: N/A;  . POLYPECTOMY N/A 05/11/2016   Procedure: REMOVAL OF ENDOMETRIAL POLYP;  Surgeon: Jonnie Kind, MD;  Location: [facility];  Service: Gynecology;  Laterality: N/A;  . TOTAL KNEE ARTHROPLASTY Left 02/05/2015   Procedure: LEFT TOTAL KNEE ARTHROPLASTY;  Surgeon: Carole Civil, MD;  Location: [facility];  Service: Orthopedics;  Laterality: Left;    There were no vitals filed for this visit.  Subjective Assessment - 08/24/18 1107    Subjective   S: I have an infection on this mole on my cheek so I haven't noticed if my shoulder has been hurting.     Currently in Pain?  No/denies         [facility] OT Assessment - 08/24/18 1050      Assessment   Medical Diagnosis  right shoulder pain      Precautions   Precautions  None               OT Treatments/Exercises (OP) - 08/24/18 1058      Exercises   Exercises  Shoulder      Shoulder Exercises: Supine   Protraction  PROM;5 reps    Horizontal ABduction  PROM;5 reps    External Rotation  PROM;5 reps    Internal Rotation  PROM;5 reps    Flexion  PROM;5 reps    ABduction  PROM;5 reps      Shoulder Exercises: Seated   Protraction  AROM;10 reps    Horizontal ABduction  AROM;10 reps    External Rotation  AROM;10 reps      Shoulder Exercises: Pulleys   Flexion  1 minute    ABduction  1 minute      Shoulder Exercises: Therapy Ball   Flexion  15 reps    ABduction  15 reps      Shoulder Exercises: ROM/Strengthening   Wall Wash  1'    Other ROM/Strengthening Exercises  PVC pipe slide; 10X      Manual Therapy   Manual Therapy  Myofascial release    Manual therapy comments  manual therapy completed seperately from all other interventions this date.    Myofascial Release  myofascial release and manual stretching to right upper arm, shoulder, and scapular region to decrease pain and improve pain free mobilty in right shoulder region.              OT Education - 08/24/18 1109    Education Details  Pt provided with A/ROM shoulder exercises (seated/standing). Instructed to stop table slides and scapular A/ROM exercises.     Person(s) Educated   Patient    Methods  Explanation;Demonstration;Handout;Verbal cues    Comprehension  Verbalized understanding;Returned demonstration       OT Short Term Goals - 08/14/18 1434      OT SHORT TERM GOAL #1   Title  Patient will be educated and independent with HEP to faciliate progress in therapy and allow her to return to using her RUE as her dominant extremity.     Time  3    Period  Weeks  Status  On-going      OT SHORT TERM GOAL #2   Title  Patient will increase RUE P/ROM to WNL in order to increase ability to complete dressing tasks with less difficulty.     Time  3    Period  Weeks    Status  On-going      OT SHORT TERM GOAL #3   Title  Patient will report a decrease in pain level of approximately 6/10 while completing simple household tasks with her RUE.     Time  3    Period  Weeks    Status  On-going        OT Long Term Goals - 08/14/18 1434      OT LONG TERM GOAL #1   Title  Patient will increase her RUE A/ROM to WNL or equal to her left UE in order to reach into cabinets or above shoulder level with less difficulty.     Time  6    Period  Weeks    Status  On-going      OT LONG TERM GOAL #2   Title  Patient will increase her RUE strength to 4/5 overall in order to return to flower and gardening activities with increased comfort.    Time  6    Period  Weeks    Status  On-going      OT LONG TERM GOAL #3   Title  Patient will decrease her fascial restrictions to min amount or less in order to increaser functional mobility needed to complete reaching tasks.     Time  6    Period  Weeks    Status  On-going      OT LONG TERM GOAL #4   Title  Patient will report a decrease in pain of approximately 3/10 or less in her RUE while completing household tasks with her Right arm.    Baseline  6    Period  Weeks    Status  On-going            Plan - 08/24/18 1107    Clinical Impression Statement  A: Patient report no pain in RUE shoulder this session. Progressed  to seated A/ROM shoulder exercises and updated HEP. Patient was able to complete new exercises with some mild pain noted. VC for form and technique were provided during session.     Plan  P: Follow up HEP. Continue with A/ROM shoulder exercises. Add UBE bike if able to tolerate.    Consulted and Agree with Plan of Care  Patient       Patient will benefit from skilled therapeutic intervention in order to improve the following deficits and impairments:  Decreased range of motion, Increased fascial restrictions, Impaired UE functional use, Pain, Decreased strength  Visit Diagnosis: Chronic right shoulder pain  Stiffness of right shoulder, not elsewhere classified  Other symptoms and signs involving the musculoskeletal system    Problem List Patient Active Problem List   Diagnosis Date Noted  . S/P total knee replacement, 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/24/2018  . Shoulder impingement, right 05/24/2018  . Dehydration   . Intractable cyclical vomiting 10/93/2355  . Pyloric stenosis in adult   . Thrombocytopenia ([place]) 12/20/2017  . Leukocytosis   . Malnutrition of moderate degree 12/17/2017  . Acute renal failure (ARF) ([place]) 12/15/2017  . Chronic abdominal pain 12/15/2017  . Gastroenteritis 06/14/2016  . Nausea with vomiting 06/10/2016  . Uterine enlargement 06/09/2016  . Intractable nausea and vomiting 06/08/2016  .  Acute infective gastroenteritis 06/08/2016  . Diarrhea 06/08/2016  . Essential hypertension 06/08/2016  . GERD (gastroesophageal reflux disease) 06/08/2016  . Abdominal pain   . Endometrial polyp 04/15/2016  . PMB (postmenopausal bleeding) 02/27/2016  . Alcoholic cirrhosis of liver without ascites ([place])   . Asthma 01/21/2016  . Hepatic cirrhosis ([place]) 09/22/2015  . Arthritis of knee, degenerative 02/05/2015  . History of Helicobacter pylori infection 10/30/2014  . Chronic [name] with cirrhosis ([place]) 04/11/2014  . De Quervain's disease (radial styloid tenosynovitis)  12/25/2013  . Anorexia 11/21/2012  . FH: colon cancer 11/21/2012  . Early satiety 10/25/2012  . Bowel habit changes 10/25/2012  . Abdominal pain, epigastric 10/25/2012  . Abdominal bloating 10/25/2012  . Constipation 10/25/2012  . Abnormal weight loss 10/25/2012  . Chronic viral [name] ([place]) 10/25/2012  . Radicular pain of left lower extremity 09/28/2012  . Back pain 09/28/2012  . Sciatica 08/10/2011  . S/P arthroscopy of left knee 08/10/2011  . Tibial plateau fracture 08/10/2011  . Pain in joint, lower leg 02/12/2011  . Stiffness of joint, not elsewhere classified, lower leg 02/12/2011  . 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 Meniscus, medial, derangement 12/29/2010  . CLOSED FRACTURE OF UPPER END OF TIBIA 08/12/2010   Ailene Ravel, OTR/L,CBIS  [phone]  08/24/2018, 11:25 AM  [facility] [address] [place], [place], [address] Phone: [phone]   Fax:  [phone]  Name: LEIGHLA CHESTNUTT MRN: [id number] Date of Birth: Dec 16, 1955

## 2018-08-25 ENCOUNTER — Encounter (HOSPITAL_COMMUNITY): Payer: Self-pay

## 2018-08-25 ENCOUNTER — Ambulatory Visit (HOSPITAL_COMMUNITY): Payer: Medicare Other

## 2018-08-25 ENCOUNTER — Emergency Department (HOSPITAL_COMMUNITY)
Admission: EM | Admit: 2018-08-25 | Discharge: 2018-08-25 | Disposition: A | Discharge status: Home / Self Care | Payer: Medicare Other | Attending: Emergency Medicine | Admitting: Emergency Medicine

## 2018-08-25 ENCOUNTER — Other Ambulatory Visit: Payer: Self-pay

## 2018-08-25 DIAGNOSIS — I10 Essential (primary) hypertension: Secondary | ICD-10-CM | POA: Diagnosis not present

## 2018-08-25 DIAGNOSIS — M25511 Pain in right shoulder: Secondary | ICD-10-CM | POA: Diagnosis not present

## 2018-08-25 DIAGNOSIS — G8929 Other chronic pain: Secondary | ICD-10-CM | POA: Diagnosis not present

## 2018-08-25 DIAGNOSIS — F17210 Nicotine dependence, cigarettes, uncomplicated: Secondary | ICD-10-CM | POA: Insufficient documentation

## 2018-08-25 DIAGNOSIS — M25611 Stiffness of right shoulder, not elsewhere classified: Secondary | ICD-10-CM | POA: Diagnosis not present

## 2018-08-25 DIAGNOSIS — Z79899 Other long term (current) drug therapy: Secondary | ICD-10-CM | POA: Diagnosis not present

## 2018-08-25 DIAGNOSIS — L0201 Cutaneous abscess of face: Secondary | ICD-10-CM | POA: Diagnosis not present

## 2018-08-25 DIAGNOSIS — R29898 Other symptoms and signs involving the musculoskeletal system: Secondary | ICD-10-CM

## 2018-08-25 MED ORDER — TRAMADOL HCL 50 MG PO TABS: ORAL_TABLET | ORAL | 0 refills | Status: DC

## 2018-08-25 NOTE — ED Provider Notes (Signed)
[facility] EMERGENCY DEPARTMENT Provider Note   CSN: [id number] Arrival date & time: 08/25/18  1427     History   Chief Complaint Chief Complaint  Patient presents with  . Abscess    HPI Colleen Lee is a 63 y.o. female.  Patient is a 63 year old female who presents to the emergency department with an abscess to the right facial cheek.  The patient states that this has been there for over a week.  Is been getting larger.  The patient was seen recently by dermatology.  The patient was placed on doxycycline and warm compresses.  The patient was told that if the abscess got larger to do return.  The dermatology office was closed today, so the patient was instructed to come to the emergency department for evaluation.  No high fevers reported.  Patient states the area is painful to touch.  No new or additional abscess areas about the face or neck area.  The history is provided by the patient.  Abscess  Location:  Face Facial abscess location:  R cheek Abscess quality: induration, painful and redness   Red streaking: no     Past Medical History:  Diagnosis Date  . Asthma   . Chronic abdominal pain   . Chronic back pain   . Chronic constipation   . Cirrhosis ([place])    Metavir score F4 on elastography 2015  . Cyclical vomiting   . Fibroids   . Fibromyalgia   . GERD (gastroesophageal reflux disease)   . H. pylori infection 2014   treated with pylera, had to be treated again as it was not eradicated. Urea breath test then negative after subsequent treatment.   . [name]    HCV RNA positive 09/2012  . Hypertension   . Marijuana use   . Nausea and vomiting    chronic, recurrent  . PONV (postoperative nausea and vomiting)    pt thinks maybe once she had N&V  . Sciatica of left side     Patient Active Problem List   Diagnosis Date Noted  . S/P total knee replacement, 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/24/2018  . Shoulder impingement, right 05/24/2018  . Dehydration   . Intractable  cyclical vomiting 40/98/1191  . Pyloric stenosis in adult   . Thrombocytopenia ([place]) 12/20/2017  . Leukocytosis   . Malnutrition of moderate degree 12/17/2017  . Acute renal failure (ARF) ([place]) 12/15/2017  . Chronic abdominal pain 12/15/2017  . Gastroenteritis 06/14/2016  . Nausea with vomiting 06/10/2016  . Uterine enlargement 06/09/2016  . Intractable nausea and vomiting 06/08/2016  . Acute infective gastroenteritis 06/08/2016  . Diarrhea 06/08/2016  . Essential hypertension 06/08/2016  . GERD (gastroesophageal reflux disease) 06/08/2016  . Abdominal pain   . Endometrial polyp 04/15/2016  . PMB (postmenopausal bleeding) 02/27/2016  . Alcoholic cirrhosis of liver without ascites ([place])   . Asthma 01/21/2016  . Hepatic cirrhosis ([place]) 09/22/2015  . Arthritis of knee, degenerative 02/05/2015  . History of Helicobacter pylori infection 10/30/2014  . Chronic [name] with cirrhosis ([place]) 04/11/2014  . De Quervain's disease (radial styloid tenosynovitis) 12/25/2013  . Anorexia 11/21/2012  . FH: colon cancer 11/21/2012  . Early satiety 10/25/2012  . Bowel habit changes 10/25/2012  . Abdominal pain, epigastric 10/25/2012  . Abdominal bloating 10/25/2012  . Constipation 10/25/2012  . Abnormal weight loss 10/25/2012  . Chronic viral [name] ([place]) 10/25/2012  . Radicular pain of left lower extremity 09/28/2012  . Back pain 09/28/2012  . Sciatica 08/10/2011  .  S/P arthroscopy of left knee 08/10/2011  . Tibial plateau fracture 08/10/2011  . Pain in joint, lower leg 02/12/2011  . Stiffness of joint, not elsewhere classified, lower leg 02/12/2011  . 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 Meniscus, medial, derangement 12/29/2010  . CLOSED FRACTURE OF UPPER END OF TIBIA 08/12/2010    Past Surgical History:  Procedure Laterality Date  . BALLOON DILATION  12/20/2017   Procedure: BALLOON DILATION;  Surgeon: Danie Binder, MD;  Location: [facility];  Service: Endoscopy;;   pyloric dilation  . BIOPSY  12/20/2017   Procedure: BIOPSY;  Surgeon: Danie Binder, MD;  Location: [facility];  Service: Endoscopy;;  duodenal and gastric biopsy  . COLONOSCOPY  01/18/2008   RJJ:OACZYS rectum.  Long redundant colon, a diminutive sigmoid polyp status post cold biopsy removed. Hyperplastic polyp. Repeat colonoscopy June 2014 due to family history of colon cancer  . COLONOSCOPY WITH ESOPHAGOGASTRODUODENOSCOPY (EGD) N/A 11/02/2012   AYT:[id number] gastric mucosa of doubtful, +H.pylori. Incomplete colonoscopy due to patient unable to tolerate exam, proximal colon seen. Patient refused ACBE.  Marland Kitchen COLONOSCOPY WITH PROPOFOL N/A 03/08/2013   Dr. Gala Romney: colonic polyp-removed as scribed above. Internal Hemorrhoids. Pathology did not reveal any colonic tissue, only mucus. SURVEILLANCE DUE Aug 2019  . COLONOSCOPY WITH PROPOFOL N/A 01/19/2018   Dr. Gala Romney: 6 mm cecal inflammatoy polyp, otherwise normal., Surveilance in 5 year s  . ESOPHAGEAL DILATION  12/20/2017   Procedure: ESOPHAGEAL DILATION;  Surgeon: Danie Binder, MD;  Location: [facility];  Service: Endoscopy;;  . ESOPHAGOGASTRODUODENOSCOPY  01/18/2008   RMR: Normal esophagus, normal  stomach  . ESOPHAGOGASTRODUODENOSCOPY (EGD) WITH PROPOFOL N/A 02/09/2016   Normal esophagus, small hiatal hernia, portal hypertensive gastropathy, normal second portion of duodenum. Due in July 2019  . ESOPHAGOGASTRODUODENOSCOPY (EGD) WITH PROPOFOL N/A 12/20/2017   Dr. Oneida Alar: benign appearing esophageal stenosis s/p dilation, mild pyloric stenosis s/p biopsy and dilation, mild duodenitis.   Marland Kitchen HYSTEROSCOPY  05/11/2016   Procedure: HYSTEROSCOPY;  Surgeon: Jonnie Kind, MD;  Location: [facility] OR[facility];  Service: Gynecology;;  . KNEE SURGERY     left knee  . MULTIPLE EXTRACTIONS WITH ALVEOLOPLASTY N/A 10/15/2013   Procedure: MULTIPLE EXTRACTION WITH ALVEOLOPLASTY;  Surgeon: Gae Bon, DDS;  Location: [facility];  Service: Oral Surgery;  Laterality: N/A;  .  POLYPECTOMY N/A 03/08/2013   Procedure: POLYPECTOMY;  Surgeon: Daneil Dolin, MD;  Location: [facility];  Service: Endoscopy;  Laterality: N/A;  . POLYPECTOMY N/A 05/11/2016   Procedure: REMOVAL OF ENDOMETRIAL POLYP;  Surgeon: Jonnie Kind, MD;  Location: [facility];  Service: Gynecology;  Laterality: N/A;  . TOTAL KNEE ARTHROPLASTY Left 02/05/2015   Procedure: LEFT TOTAL KNEE ARTHROPLASTY;  Surgeon: Carole Civil, MD;  Location: [facility];  Service: Orthopedics;  Laterality: Left;     OB History    Gravida      Para      Term      Preterm      AB      Living  1     SAB      TAB      Ectopic      Multiple      Live Births               Home Medications    Prior to Admission medications   Medication Sig Start Date End Date Taking? Authorizing Provider  albuterol (PROVENTIL HFA;VENTOLIN HFA) 108 (90 Base) MCG/ACT inhaler Inhale into the lungs  every 6 (six) hours as needed for wheezing or shortness of breath.    [provider]  budesonide-formoterol (SYMBICORT) 160-4.5 MCG/ACT inhaler Inhale 2 puffs into the lungs 2 (two) times daily.    [provider]  Cyanocobalamin (VITAMIN B 12 PO) Take 1 tablet by mouth daily.    [provider]  cyclobenzaprine (FLEXERIL) 10 MG tablet Take 10 mg by mouth 3 (three) times daily as needed for muscle spasms.    [provider]  dexamethasone (DECADRON) 4 MG tablet Take 1 tablet (4 mg total) by mouth 2 (two) times daily with a meal. 07/10/18   Lily Kocher, PA-C  doxycycline (VIBRAMYCIN) 100 MG capsule Take 1 capsule (100 mg total) by mouth 2 (two) times daily. 07/10/18   Lily Kocher, PA-C  DULoxetine (CYMBALTA) 30 MG capsule Take 1 capsule by mouth 2 (two) times daily. 11/02/17   [provider]  furosemide (LASIX) 20 MG tablet Take 1 tablet (20 mg total) by mouth daily. 01/31/18   Barton Dubois, MD  hydrALAZINE (APRESOLINE) 50 MG tablet Take 1 tablet (50 mg total) by mouth every 8 (eight)  hours. 01/30/18   Barton Dubois, MD  HYDROcodone-acetaminophen (NORCO/VICODIN) 5-325 MG tablet Take 1 tablet by mouth as needed for moderate pain.  11/02/17   [provider]  hydrOXYzine (ATARAX/VISTARIL) 10 MG tablet Take 10 mg by mouth daily.  11/30/17   [provider]  labetalol (NORMODYNE) 100 MG tablet Take 2 tablets (200 mg total) by mouth 2 (two) times daily. 01/30/18   Barton Dubois, MD  losartan (COZAAR) 100 MG tablet Take 1 tablet (100 mg total) by mouth daily. 01/31/18   Barton Dubois, MD  losartan-hydrochlorothiazide [facility]) 100-12.5 MG tablet  05/11/18   [provider]  megestrol (MEGACE) 40 MG tablet Take 40 mg by mouth daily.    [provider]  metoCLOPramide (REGLAN) 10 MG tablet  05/11/18   [provider]  ondansetron (ZOFRAN ODT) 4 MG disintegrating tablet Take 1 tablet (4 mg total) by mouth 3 (three) times daily. 43m ODT q4 hours prn nausea/vomit 12/22/17   MKathie Dike MD  pantoprazole (PROTONIX) 40 MG tablet Take 1 tablet (40 mg total) by mouth 2 (two) times daily before a meal. 12/22/17   MKathie Dike MD  potassium chloride SA (K-DUR,KLOR-CON) 20 MEQ tablet Take 20 mEq by mouth 2 (two) times daily.    [provider]  promethazine-dextromethorphan (PROMETHAZINE-DM) 6.25-15 MG/5ML syrup Take 10 mLs by mouth 4 (four) times daily as needed. 07/10/18   BLily Kocher PA-C  TRULANCE 3 MG TABS TAKE 1 TABLET BY MOUTH ONCE A DAY. 06/07/18   GCarlis Stable NP    Family History Family History  Problem Relation Age of Onset  . Colon cancer Brother 588         . Multiple myeloma Brother   . Liver cancer Sister   . Prostate cancer Brother   . Pancreatic cancer Brother   . Cancer Mother        breast  . Asthma Mother     Social History Social History   Tobacco Use  . Smoking status: Current Every Day Smoker    Packs/day: 1.00    Years: 40.00    Pack years: 40.00    Types: Cigarettes  . Smokeless tobacco: Never Used   . Tobacco comment: Smokes one pack of cigarettes daily  Substance Use Topics  . Alcohol use: Yes    Alcohol/week: 0.0 standard drinks  Comment: occas  . Drug use: Not Currently    Types: Marijuana    Comment: history of marijuana in past     Allergies   Penicillins   Review of Systems Review of Systems  Constitutional: Negative for activity change.       All ROS Neg except as noted in HPI  HENT: Negative for nosebleeds.   Eyes: Negative for photophobia and discharge.  Respiratory: Negative for cough, shortness of breath and wheezing.   Cardiovascular: Negative for chest pain and palpitations.  Gastrointestinal: Negative for abdominal pain and blood in stool.  Genitourinary: Negative for dysuria, frequency and hematuria.  Musculoskeletal: Negative for arthralgias, back pain and neck pain.  Skin:       Abscess  Neurological: Negative for dizziness, seizures and speech difficulty.  Psychiatric/Behavioral: Negative for confusion and hallucinations.     Physical Exam Updated Vital Signs BP (!) 141/73 (BP Location: Right Arm)   Pulse 75   Temp 99.2 F (37.3 C) (Temporal)   Resp 18   Ht 5' 6" (1.676 m)   Wt 81.6 kg   SpO2 95%   BMI 29.05 kg/m   Physical Exam Vitals signs and nursing note reviewed.  Constitutional:      Appearance: She is well-developed. She is not toxic-appearing.  HENT:     Head: Normocephalic.     Right Ear: Tympanic membrane and external ear normal.     Left Ear: Tympanic membrane and external ear normal.  Eyes:     General: Lids are normal.     Pupils: Pupils are equal, round, and reactive to light.  Neck:     Musculoskeletal: Normal range of motion and neck supple.     Vascular: No carotid bruit.  Cardiovascular:     Rate and Rhythm: Normal rate and regular rhythm.     Pulses: Normal pulses.     Heart sounds: Normal heart sounds.  Pulmonary:     Effort: No respiratory distress.     Breath sounds: Normal breath sounds.  Abdominal:       General: Bowel sounds are normal.     Palpations: Abdomen is soft.     Tenderness: There is no abdominal tenderness. There is no guarding.  Musculoskeletal: Normal range of motion.  Lymphadenopathy:     Head:     Right side of head: No submandibular adenopathy.     Left side of head: No submandibular adenopathy.     Cervical: No cervical adenopathy.  Skin:    General: Skin is warm and dry.     Comments: Abscess of the right facial cheek.  See pictures.  Neurological:     Mental Status: She is alert and oriented to person, place, and time.     Cranial Nerves: No cranial nerve deficit.     Sensory: No sensory deficit.  Psychiatric:        Speech: Speech normal.          ED Treatments / Results  Labs (all labs ordered are listed, but only abnormal results are displayed) Labs Reviewed - No data to display  EKG None  Radiology No results found.  Procedures Procedures (including critical care time)  Medications Ordered in ED Medications - No data to display   Initial Impression / Assessment and Plan / ED Course  I have reviewed the triage vital signs and the nursing notes.  Pertinent labs & imaging results that were available during my care of the patient were reviewed by me and  considered in my medical decision making (see chart for details).       Final Clinical Impressions(s) / ED Diagnoses MDM  Temperature is 99.2.  Blood pressure is 141/73.  The vital signs are otherwise within normal limits.  Pulse oximetry is 95% on room air.  The patient has an abscess of the right facial cheek.  It is tender to touch.  It is under a mole.  No red streaks appreciated.  No drainage appreciated at this time.  I discussed with the patient incision and drainage.  I also discussed with the patient the potential for scarring.  The patient was given the option of taking care of the situation here today or to be seen by 1 of the plastic surgery specialist.  The patient  states that she was seeing Dr. [place] Crane and his team.  After thinking about the procedure, the patient states that she thinks now she wants to continue with the warm compresses.  Continue with the doxycycline antibiotic that she is taking, and she is requesting for assistance with something with pain because the area is so uncomfortable.  I have given the patient the opportunity to return if any changes in condition, or questions concerning the abscess.  The patient elects to see Dr. Nevada Crane and his team at this time.   Final diagnoses:  Abscess of face    ED Discharge Orders         Ordered    traMADol (ULTRAM) 50 MG tablet     08/25/18 1533           Lily Kocher, PA-C 08/25/18 [place], [place], DO 08/28/18 1356

## 2018-08-25 NOTE — Discharge Instructions (Addendum)
Please apply warm compresses to the abscess of the right face twice a day.  Continue your doxycycline 2 times daily.  Use Tylenol extra strength every 4 hours for mild pain.  Use Ultram every 6 hours if needed for more severe pain. This medication may cause drowsiness. Please do not drink, drive, or participate in activity that requires concentration while taking this medication.  Please see Colleen Lee or a member of her team for drainage of the abscess area.

## 2018-08-25 NOTE — ED Triage Notes (Signed)
Pt reports that she went to PCP on Tuesday due to area on the inside of her right cheek. Reports she was started on antibiotics and now area is on the outside of her right cheek.

## 2018-08-25 NOTE — Therapy (Signed)
[facility] [place], [place], [address] Phone: [phone]   Fax:  [phone]  Occupational Therapy Treatment  Patient Details  Name: Colleen Lee MRN: [id number] Date of Birth: 1955/09/17 Referring Provider (OT): Dr. Arther Abbott   Encounter Date: 08/25/2018  OT End of Session - 08/25/18 1101    Visit Number  4    Number of Visits  12    Date for OT Re-Evaluation  09/14/18   mini reassess on 08/28/2018   Authorization Type  Medicare. no visit limit    OT Start Time  1040    OT Stop Time  1115    OT Time Calculation (min)  35 min    Activity Tolerance  Patient tolerated treatment well    Behavior During Therapy  WFL for tasks assessed/performed       Past Medical History:  Diagnosis Date  . Asthma   . Chronic abdominal pain   . Chronic back pain   . Chronic constipation   . Cirrhosis ([place])    Metavir score F4 on elastography 2015  . Cyclical vomiting   . Fibroids   . Fibromyalgia   . GERD (gastroesophageal reflux disease)   . H. pylori infection 2014   treated with pylera, had to be treated again as it was not eradicated. Urea breath test then negative after subsequent treatment.   . [name]    HCV RNA positive 09/2012  . Hypertension   . Marijuana use   . Nausea and vomiting    chronic, recurrent  . PONV (postoperative nausea and vomiting)    pt thinks maybe once she had N&V  . Sciatica of left side     Past Surgical History:  Procedure Laterality Date  . BALLOON DILATION  12/20/2017   Procedure: BALLOON DILATION;  Surgeon: Danie Binder, MD;  [facility];  Service: Endoscopy;;  pyloric dilation  . BIOPSY  12/20/2017   Procedure: BIOPSY;  Surgeon: Danie Binder, MD;  [facility];  Service: Endoscopy;;  duodenal and gastric biopsy  . COLONOSCOPY  01/18/2008   FUX:NATFTD rectum.  Long redundant colon, a diminutive sigmoid polyp status post cold biopsy removed.  Hyperplastic polyp. Repeat colonoscopy June 2014 due to family history of colon cancer  . COLONOSCOPY WITH ESOPHAGOGASTRODUODENOSCOPY (EGD) N/A 11/02/2012   DUK:GURKYHCW gastric mucosa of doubtful, +H.pylori. Incomplete colonoscopy due to patient unable to tolerate exam, proximal colon seen. Patient refused ACBE.  Marland Kitchen COLONOSCOPY WITH PROPOFOL N/A 03/08/2013   Dr. Gala Romney: colonic polyp-removed as scribed above. Internal Hemorrhoids. Pathology did not reveal any colonic tissue, only mucus. SURVEILLANCE DUE Aug 2019  . COLONOSCOPY WITH PROPOFOL N/A 01/19/2018   Dr. Gala Romney: 6 mm cecal inflammatoy polyp, otherwise normal., Surveilance in 5 year s  . ESOPHAGEAL DILATION  12/20/2017   Procedure: ESOPHAGEAL DILATION;  Surgeon: Danie Binder, MD;  [facility];  Service: Endoscopy;;  . ESOPHAGOGASTRODUODENOSCOPY  01/18/2008   RMR: Normal esophagus, normal  stomach  . ESOPHAGOGASTRODUODENOSCOPY (EGD) WITH PROPOFOL N/A 02/09/2016   Normal esophagus, small hiatal hernia, portal hypertensive gastropathy, normal second portion of duodenum. Due in July 2019  . ESOPHAGOGASTRODUODENOSCOPY (EGD) WITH PROPOFOL N/A 12/20/2017   Dr. Oneida Alar: benign appearing esophageal stenosis s/p dilation, mild pyloric stenosis s/p biopsy and dilation, mild duodenitis.   Marland Kitchen HYSTEROSCOPY  05/11/2016   Procedure: HYSTEROSCOPY;  Surgeon: Jonnie Kind, MD;  [facility];  Service: Gynecology;;  . KNEE SURGERY  left knee  . MULTIPLE EXTRACTIONS WITH ALVEOLOPLASTY N/A 10/15/2013   Procedure: MULTIPLE EXTRACTION WITH ALVEOLOPLASTY;  Surgeon: Gae Bon, DDS;  Location: [facility];  Service: Oral Surgery;  Laterality: N/A;  . POLYPECTOMY N/A 03/08/2013   Procedure: POLYPECTOMY;  Surgeon: Daneil Dolin, MD;  [facility];  Service: Endoscopy;  Laterality: N/A;  . POLYPECTOMY N/A 05/11/2016   Procedure: REMOVAL OF ENDOMETRIAL POLYP;  Surgeon: Jonnie Kind, MD;  [facility];  Service: Gynecology;  Laterality: N/A;  .  TOTAL KNEE ARTHROPLASTY Left 02/05/2015   Procedure: LEFT TOTAL KNEE ARTHROPLASTY;  Surgeon: Carole Civil, MD;  [facility];  Service: Orthopedics;  Laterality: Left;    There were no vitals filed for this visit.  Subjective Assessment - 08/25/18 1100    Subjective   S: My shoulder is really sore today. It's probably from the exercises yesterday.     Currently in Pain?  Yes    Pain Score  9     Pain Location  Shoulder    Pain Orientation  Right    Pain Descriptors / Indicators  Aching    Pain Type  Acute pain    Pain Radiating Towards  N/A    Pain Onset  Yesterday    Pain Frequency  Constant    Aggravating Factors   New exercises yesterday from therapy    Pain Relieving Factors  heating, pad, prescription pain medications    Effect of Pain on Daily Activities  mod effect    Multiple Pain Sites  No         [facility] OT Assessment - 08/25/18 1102      Assessment   Medical Diagnosis  right shoulder pain      Precautions   Precautions  None               OT Treatments/Exercises (OP) - 08/25/18 1102      Exercises   Exercises  Shoulder      Shoulder Exercises: Supine   Protraction  PROM;5 reps;AROM;10 reps    Horizontal ABduction  PROM;5 reps;AROM;10 reps    External Rotation  PROM;5 reps;AROM;10 reps    Internal Rotation  PROM;5 reps;AROM;10 reps    Flexion  PROM;5 reps;AROM;10 reps    ABduction  PROM;5 reps;AROM;10 reps      Shoulder Exercises: Seated   Protraction  AROM;10 reps    Horizontal ABduction  AROM;10 reps    External Rotation  AROM;10 reps    Internal Rotation  AROM;10 reps    Flexion  AROM;10 reps    Abduction  AROM;10 reps      Shoulder Exercises: Therapy Ball   Flexion  15 reps    ABduction  15 reps      Shoulder Exercises: ROM/Strengthening   Wall Wash  1'    Other ROM/Strengthening Exercises  PVC pipe slide; 10X      Manual Therapy   Manual Therapy  Myofascial release    Manual therapy comments  manual therapy completed  seperately from all other interventions this date.    Myofascial Release  myofascial release and manual stretching to right upper arm, shoulder, and scapular region to decrease pain and improve pain free mobilty in right shoulder region.                OT Short Term Goals - 08/14/18 1434      OT SHORT TERM GOAL #1   Title  Patient will be educated and independent  with HEP to faciliate progress in therapy and allow her to return to using her RUE as her dominant extremity.     Time  3    Period  Weeks    Status  On-going      OT SHORT TERM GOAL #2   Title  Patient will increase RUE P/ROM to WNL in order to increase ability to complete dressing tasks with less difficulty.     Time  3    Period  Weeks    Status  On-going      OT SHORT TERM GOAL #3   Title  Patient will report a decrease in pain level of approximately 6/10 while completing simple household tasks with her RUE.     Time  3    Period  Weeks    Status  On-going        OT Long Term Goals - 08/14/18 1434      OT LONG TERM GOAL #1   Title  Patient will increase her RUE A/ROM to WNL or equal to her left UE in order to reach into cabinets or above shoulder level with less difficulty.     Time  6    Period  Weeks    Status  On-going      OT LONG TERM GOAL #2   Title  Patient will increase her RUE strength to 4/5 overall in order to return to flower and gardening activities with increased comfort.    Time  6    Period  Weeks    Status  On-going      OT LONG TERM GOAL #3   Title  Patient will decrease her fascial restrictions to min amount or less in order to increaser functional mobility needed to complete reaching tasks.     Time  6    Period  Weeks    Status  On-going      OT LONG TERM GOAL #4   Title  Patient will report a decrease in pain of approximately 3/10 or less in her RUE while completing household tasks with her Right arm.    Baseline  6    Period  Weeks    Status  On-going             Plan - 08/25/18 1114    Clinical Impression Statement  A: Pt reports increased shoulder pain/soreness since previous therapy session yesterday. [name] pain tolerance was monitored during session and patient was able to complete all exercises with rest breaks as needed. VC for form and technique were provided. Fascial restrictions noted in right anterior portion of shoulder with manual techniques completed to address.     Plan  P: continue with A/ROM exercises. mini reassessment and FOTO for MD appointment Wednesday.     Consulted and Agree with Plan of Care  Patient       Patient will benefit from skilled therapeutic intervention in order to improve the following deficits and impairments:  Decreased range of motion, Increased fascial restrictions, Impaired UE functional use, Pain, Decreased strength  Visit Diagnosis: Chronic right shoulder pain  Stiffness of right shoulder, not elsewhere classified  Other symptoms and signs involving the musculoskeletal system    Problem List Patient Active Problem List   Diagnosis Date Noted  . S/P total knee replacement, 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/24/2018  . Shoulder impingement, right 05/24/2018  . Dehydration   . Intractable cyclical vomiting 17/40/8144  . Pyloric stenosis in adult   . Thrombocytopenia ([place]) 12/20/2017  .  Leukocytosis   . Malnutrition of moderate degree 12/17/2017  . Acute renal failure (ARF) ([place]) 12/15/2017  . Chronic abdominal pain 12/15/2017  . Gastroenteritis 06/14/2016  . Nausea with vomiting 06/10/2016  . Uterine enlargement 06/09/2016  . Intractable nausea and vomiting 06/08/2016  . Acute infective gastroenteritis 06/08/2016  . Diarrhea 06/08/2016  . Essential hypertension 06/08/2016  . GERD (gastroesophageal reflux disease) 06/08/2016  . Abdominal pain   . Endometrial polyp 04/15/2016  . PMB (postmenopausal bleeding) 02/27/2016  . Alcoholic cirrhosis of liver without ascites ([place])   . Asthma 01/21/2016   . Hepatic cirrhosis ([place]) 09/22/2015  . Arthritis of knee, degenerative 02/05/2015  . History of Helicobacter pylori infection 10/30/2014  . Chronic [name] with cirrhosis ([place]) 04/11/2014  . De Quervain's disease (radial styloid tenosynovitis) 12/25/2013  . Anorexia 11/21/2012  . FH: colon cancer 11/21/2012  . Early satiety 10/25/2012  . Bowel habit changes 10/25/2012  . Abdominal pain, epigastric 10/25/2012  . Abdominal bloating 10/25/2012  . Constipation 10/25/2012  . Abnormal weight loss 10/25/2012  . Chronic viral [name] ([place]) 10/25/2012  . Radicular pain of left lower extremity 09/28/2012  . Back pain 09/28/2012  . Sciatica 08/10/2011  . S/P arthroscopy of left knee 08/10/2011  . Tibial plateau fracture 08/10/2011  . Pain in joint, lower leg 02/12/2011  . Stiffness of joint, not elsewhere classified, lower leg 02/12/2011  . 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 Meniscus, medial, derangement 12/29/2010  . CLOSED FRACTURE OF UPPER END OF TIBIA 08/12/2010   Ailene Ravel, OTR/L,CBIS  [phone]  08/25/2018, 12:16 PM  [facility] [address] [place], [place], [address] Phone: [phone]   Fax:  [phone]  Name: Colleen Lee MRN: [id number] Date of Birth: February 14, 1956

## 2018-08-28 ENCOUNTER — Ambulatory Visit (HOSPITAL_COMMUNITY): Payer: Medicare Other

## 2018-08-28 ENCOUNTER — Encounter (HOSPITAL_COMMUNITY): Payer: Self-pay

## 2018-08-28 DIAGNOSIS — M25511 Pain in right shoulder: Principal | ICD-10-CM

## 2018-08-28 DIAGNOSIS — R29898 Other symptoms and signs involving the musculoskeletal system: Secondary | ICD-10-CM

## 2018-08-28 DIAGNOSIS — G8929 Other chronic pain: Secondary | ICD-10-CM

## 2018-08-28 DIAGNOSIS — M25611 Stiffness of right shoulder, not elsewhere classified: Secondary | ICD-10-CM

## 2018-08-28 NOTE — Therapy (Signed)
[facility] [address] [place], [place], [address] Phone: [phone]   Fax:  [phone]  Occupational Therapy Treatment Mini reassessment  Patient Details  Name: Colleen Lee MRN: [id number] Date of Birth: 03/27/1956 Referring Provider (OT): Dr. Arther Abbott   Progress Note Reporting Period 08/03/2018 to 08/28/2018  See note below for Objective Data and Assessment of Progress/Goals.       Encounter Date: 08/28/2018  OT End of Session - 08/28/18 1107    Visit Number  5    Number of Visits  12    Date for OT Re-Evaluation  09/14/18    Authorization Type  Medicare. no visit limit    OT Start Time  1035    OT Stop Time  1115    OT Time Calculation (min)  40 min    Activity Tolerance  Patient tolerated treatment well    Behavior During Therapy  WFL for tasks assessed/performed       Past Medical History:  Diagnosis Date  . Asthma   . Chronic abdominal pain   . Chronic back pain   . Chronic constipation   . Cirrhosis ([place])    Metavir score F4 on elastography 2015  . Cyclical vomiting   . Fibroids   . Fibromyalgia   . GERD (gastroesophageal reflux disease)   . H. pylori infection 2014   treated with pylera, had to be treated again as it was not eradicated. Urea breath test then negative after subsequent treatment.   . [name]    HCV RNA positive 09/2012  . Hypertension   . Marijuana use   . Nausea and vomiting    chronic, recurrent  . PONV (postoperative nausea and vomiting)    pt thinks maybe once she had N&V  . Sciatica of left side     Past Surgical History:  Procedure Laterality Date  . BALLOON DILATION  12/20/2017   Procedure: BALLOON DILATION;  Surgeon: Danie Binder, MD;  [facility];  Service: Endoscopy;;  pyloric dilation  . BIOPSY  12/20/2017   Procedure: BIOPSY;  Surgeon: Danie Binder, MD;  [facility];  Service: Endoscopy;;  duodenal and gastric biopsy  .  COLONOSCOPY  01/18/2008   [id number] rectum.  Long redundant colon, a diminutive sigmoid polyp status post cold biopsy removed. Hyperplastic polyp. Repeat colonoscopy June 2014 due to family history of colon cancer  . COLONOSCOPY WITH ESOPHAGOGASTRODUODENOSCOPY (EGD) N/A 11/02/2012   [id number] gastric mucosa of doubtful, +H.pylori. Incomplete colonoscopy due to patient unable to tolerate exam, proximal colon seen. Patient refused ACBE.  Marland Kitchen COLONOSCOPY WITH PROPOFOL N/A 03/08/2013   Dr. Gala Romney: colonic polyp-removed as scribed above. Internal Hemorrhoids. Pathology did not reveal any colonic tissue, only mucus. SURVEILLANCE DUE Aug 2019  . COLONOSCOPY WITH PROPOFOL N/A 01/19/2018   Dr. Gala Romney: 6 mm cecal inflammatoy polyp, otherwise normal., Surveilance in 5 year s  . ESOPHAGEAL DILATION  12/20/2017   Procedure: ESOPHAGEAL DILATION;  Surgeon: Danie Binder, MD;  [facility];  Service: Endoscopy;;  . ESOPHAGOGASTRODUODENOSCOPY  01/18/2008   RMR: Normal esophagus, normal  stomach  . ESOPHAGOGASTRODUODENOSCOPY (EGD) WITH PROPOFOL N/A 02/09/2016   Normal esophagus, small hiatal hernia, portal hypertensive gastropathy, normal second portion of duodenum. Due in July 2019  . ESOPHAGOGASTRODUODENOSCOPY (EGD) WITH PROPOFOL N/A 12/20/2017   Dr. Oneida Alar: benign appearing esophageal stenosis s/p dilation, mild pyloric stenosis s/p biopsy and dilation, mild duodenitis.   Marland Kitchen HYSTEROSCOPY  05/11/2016  Procedure: HYSTEROSCOPY;  Surgeon: Jonnie Kind, MD;  [facility];  Service: Gynecology;;  . KNEE SURGERY     left knee  . MULTIPLE EXTRACTIONS WITH ALVEOLOPLASTY N/A 10/15/2013   Procedure: MULTIPLE EXTRACTION WITH ALVEOLOPLASTY;  Surgeon: Gae Bon, DDS;  Location: [facility];  Service: Oral Surgery;  Laterality: N/A;  . POLYPECTOMY N/A 03/08/2013   Procedure: POLYPECTOMY;  Surgeon: Daneil Dolin, MD;  [facility];  Service: Endoscopy;  Laterality: N/A;  . POLYPECTOMY N/A 05/11/2016    Procedure: REMOVAL OF ENDOMETRIAL POLYP;  Surgeon: Jonnie Kind, MD;  [facility];  Service: Gynecology;  Laterality: N/A;  . TOTAL KNEE ARTHROPLASTY Left 02/05/2015   Procedure: LEFT TOTAL KNEE ARTHROPLASTY;  Surgeon: Carole Civil, MD;  [facility];  Service: Orthopedics;  Laterality: Left;    There were no vitals filed for this visit.  Subjective Assessment - 08/28/18 1041    Subjective   S: My shoulder was sore over the weekend.    Currently in Pain?  Yes    Pain Score  2     Pain Location  Shoulder    Pain Orientation  Right    Pain Descriptors / Indicators  Sore    Pain Type  Acute pain         [facility] OT Assessment - 08/28/18 1041      Assessment   Medical Diagnosis  right shoulder pain      Precautions   Precautions  None      Observation/Other Assessments   Focus on Therapeutic Outcomes (FOTO)   53/100      ROM / Strength   AROM / PROM / Strength  AROM;PROM;Strength      Palpation   Palpation comment  min fascial restrictions in right upper arm, trapezius, and scapularis region.       AROM   Overall AROM Comments  Assessed seated. IR/er adducted    AROM Assessment Site  Shoulder    Right/Left Shoulder  Right    Right Shoulder Flexion  160 Degrees   previous: 90   Right Shoulder ABduction  170 Degrees   previous: 125   Right Shoulder Internal Rotation  90 Degrees   previous: 90   Right Shoulder External Rotation  80 Degrees   previous: 50     PROM   Overall PROM Comments  Assessed supine. IR/er adducted.    PROM Assessment Site  Shoulder    Right/Left Shoulder  Right    Right Shoulder Flexion  160 Degrees   previous: 160   Right Shoulder ABduction  180 Degrees   previous: 180   Right Shoulder Internal Rotation  90 Degrees   previous: 90   Right Shoulder External Rotation  80 Degrees   previous: 80     Strength   Overall Strength Comments  Assessed IR/er adducted    Strength Assessment Site  Shoulder    Right/Left Shoulder  Right     Right Shoulder Flexion  3/5   previous: 3-/5   Right Shoulder ABduction  3+/5   previous: 3/5   Right Shoulder Internal Rotation  4/5   previous: same   Right Shoulder External Rotation  4+/5   previous: 3-/5              OT Treatments/Exercises (OP) - 08/28/18 0001      Exercises   Exercises  Shoulder      Shoulder Exercises: Supine   Protraction  PROM;5 reps  Horizontal ABduction  PROM;5 reps    External Rotation  PROM;5 reps    Internal Rotation  PROM;5 reps    Flexion  PROM;5 reps    ABduction  PROM;5 reps      Shoulder Exercises: Seated   Protraction  AROM;12 reps    Horizontal ABduction  AROM;12 reps    External Rotation  AROM;12 reps    Internal Rotation  AROM;12 reps    Flexion  AROM;12 reps    Abduction  AROM;12 reps      Shoulder Exercises: Pulleys   Flexion  1 minute    ABduction  1 minute      Shoulder Exercises: ROM/Strengthening   Wall Wash  1'      Manual Therapy   Manual Therapy  Myofascial release    Manual therapy comments  manual therapy completed seperately from all other interventions this date.    Myofascial Release  myofascial release and manual stretching to right upper arm, shoulder, and scapular region to decrease pain and improve pain free mobilty in right shoulder region.                OT Short Term Goals - 08/28/18 1054      OT SHORT TERM GOAL #1   Title  Patient will be educated and independent with HEP to faciliate progress in therapy and allow her to return to using her RUE as her dominant extremity.     Time  3    Period  Weeks    Status  Achieved      OT SHORT TERM GOAL #2   Title  Patient will increase RUE P/ROM to WNL in order to increase ability to complete dressing tasks with less difficulty.     Time  3    Period  Weeks    Status  Achieved      OT SHORT TERM GOAL #3   Title  Patient will report a decrease in pain level of approximately 6/10 while completing simple household tasks with her RUE.      Time  3    Period  Weeks    Status  Partially Met        OT Long Term Goals - 08/28/18 1055      OT LONG TERM GOAL #1   Title  Patient will increase her RUE A/ROM to WNL or equal to her left UE in order to reach into cabinets or above shoulder level with less difficulty.     Time  6    Period  Weeks    Status  Achieved      OT LONG TERM GOAL #2   Title  Patient will increase her RUE strength to 4/5 overall in order to return to flower and gardening activities with increased comfort.    Time  6    Period  Weeks    Status  On-going      OT LONG TERM GOAL #3   Title  Patient will decrease her fascial restrictions to min amount or less in order to increase functional mobility needed to complete reaching tasks.     Time  6    Period  Weeks    Status  Achieved      OT LONG TERM GOAL #4   Title  Patient will report a decrease in pain of approximately 3/10 or less in her RUE while completing household tasks with her Right arm.    Baseline  6    Period  Weeks    Status  On-going            Plan - 08/28/18 1109    Clinical Impression Statement  A: Mini reassessment completed this date for MD appointment on Wednesday. pt reports that she is is noticing an increase in her ROM and she is sleeping better. She continues to have pain with increased use which affects her strength. [name] passive and Active ROM is WNL or equal to LUE this date. Fascial restrictions have decreased to minimal amount in the right anterior and medial deltoid region.     Plan  P: Add scapular theraband exercises using red band. Add overhead lacing. follow up on MD appointment.     Consulted and Agree with Plan of Care  Patient       Patient will benefit from skilled therapeutic intervention in order to improve the following deficits and impairments:  Decreased range of motion, Increased fascial restrictions, Impaired UE functional use, Pain, Decreased strength  Visit Diagnosis: Chronic right shoulder  pain  Stiffness of right shoulder, not elsewhere classified  Other symptoms and signs involving the musculoskeletal system    Problem List Patient Active Problem List   Diagnosis Date Noted  . S/P total knee replacement, 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/24/2018  . Shoulder impingement, right 05/24/2018  . Dehydration   . Intractable cyclical vomiting 63/33/5456  . Pyloric stenosis in adult   . Thrombocytopenia ([place]) 12/20/2017  . Leukocytosis   . Malnutrition of moderate degree 12/17/2017  . Acute renal failure (ARF) ([place]) 12/15/2017  . Chronic abdominal pain 12/15/2017  . Gastroenteritis 06/14/2016  . Nausea with vomiting 06/10/2016  . Uterine enlargement 06/09/2016  . Intractable nausea and vomiting 06/08/2016  . Acute infective gastroenteritis 06/08/2016  . Diarrhea 06/08/2016  . Essential hypertension 06/08/2016  . GERD (gastroesophageal reflux disease) 06/08/2016  . Abdominal pain   . Endometrial polyp 04/15/2016  . PMB (postmenopausal bleeding) 02/27/2016  . Alcoholic cirrhosis of liver without ascites ([place])   . Asthma 01/21/2016  . Hepatic cirrhosis ([place]) 09/22/2015  . Arthritis of knee, degenerative 02/05/2015  . History of Helicobacter pylori infection 10/30/2014  . Chronic [name] with cirrhosis ([place]) 04/11/2014  . De Quervain's disease (radial styloid tenosynovitis) 12/25/2013  . Anorexia 11/21/2012  . FH: colon cancer 11/21/2012  . Early satiety 10/25/2012  . Bowel habit changes 10/25/2012  . Abdominal pain, epigastric 10/25/2012  . Abdominal bloating 10/25/2012  . Constipation 10/25/2012  . Abnormal weight loss 10/25/2012  . Chronic viral [name] ([place]) 10/25/2012  . Radicular pain of left lower extremity 09/28/2012  . Back pain 09/28/2012  . Sciatica 08/10/2011  . S/P arthroscopy of left knee 08/10/2011  . Tibial plateau fracture 08/10/2011  . Pain in joint, lower leg 02/12/2011  . Stiffness of joint, not elsewhere classified, lower leg 02/12/2011  .  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 Meniscus, medial, derangement 12/29/2010  . CLOSED FRACTURE OF UPPER END OF TIBIA 08/12/2010   Ailene Ravel, OTR/L,CBIS  [phone]  08/28/2018, 11:13 AM  [facility] [place], [place], [address] Phone: [phone]   Fax:  [phone]  Name: Colleen Lee MRN: [id number] Date of Birth: October 12, 1955

## 2018-08-29 DIAGNOSIS — L720 Epidermal cyst: Secondary | ICD-10-CM | POA: Diagnosis not present

## 2018-08-30 ENCOUNTER — Encounter: Payer: Self-pay | Admitting: Orthopedic Surgery

## 2018-08-30 ENCOUNTER — Ambulatory Visit (HOSPITAL_COMMUNITY): Payer: Medicare Other

## 2018-08-30 ENCOUNTER — Encounter (HOSPITAL_COMMUNITY): Payer: Self-pay

## 2018-08-30 ENCOUNTER — Ambulatory Visit (INDEPENDENT_AMBULATORY_CARE_PROVIDER_SITE_OTHER): Payer: Medicare Other | Admitting: Orthopedic Surgery

## 2018-08-30 VITALS — BP 104/72 | HR 78 | Ht 66.0 in | Wt 174.0 lb

## 2018-08-30 DIAGNOSIS — R29898 Other symptoms and signs involving the musculoskeletal system: Secondary | ICD-10-CM

## 2018-08-30 DIAGNOSIS — M12811 Other specific arthropathies, not elsewhere classified, right shoulder: Secondary | ICD-10-CM

## 2018-08-30 DIAGNOSIS — M25511 Pain in right shoulder: Principal | ICD-10-CM

## 2018-08-30 DIAGNOSIS — G8929 Other chronic pain: Secondary | ICD-10-CM

## 2018-08-30 DIAGNOSIS — M25611 Stiffness of right shoulder, not elsewhere classified: Secondary | ICD-10-CM

## 2018-08-30 NOTE — Progress Notes (Signed)
Chief Complaint  Patient presents with  . Shoulder Pain    right / has improved     Fu after PT   She says the shoulder is at her after several weeks of therapy, she says there are still certain movements she does such as sweeping mopping washing dishes that bother   Last visit: 63 year old female presents for follow-up last seen October 23 for her right shoulder.  She has atraumatic pain right shoulder painful range of motion difficulty with activities of daily living no improvement after 1 subacromial injection x-ray shows rotator cuff arthropathy with acromiohumeral distance 8.5 slight proximal migration of the humerus without arthritis.  Review of Systems  Musculoskeletal: Negative for neck pain.  Neurological: Negative for tingling.    BP 104/72   Pulse 78   Ht 5\' 6"  (1.676 m)   Wt 174 lb (78.9 kg)   BMI 28.08 kg/m   Physical Exam Vitals signs reviewed.  Constitutional:      Appearance: Normal appearance. She is well-developed.  Musculoskeletal:     Right shoulder: She exhibits decreased range of motion, tenderness, pain and decreased strength. She exhibits no swelling, no deformity, no laceration, no spasm and normal pulse.       Arms:  Neurological:     Mental Status: She is alert and oriented to person, place, and time.  Psychiatric:        Attention and Perception: Attention normal.        Mood and Affect: Mood and affect normal.        Speech: Speech normal.        Behavior: Behavior normal.        Thought Content: Thought content normal.        Judgment: Judgment normal.    Encounter Diagnoses  Name Primary?  . Chronic right shoulder pain Yes  . Rotator cuff arthropathy of right shoulder     [facility] report   Dictated by Dr. Aline Brochure     Chief complaint pain right shoulder   AP lateral right shoulder   Right shoulder pain stiffness   X-ray shows mild proximal migration of the humerus normal glenoid humeral joint space  acromial tuberosity sclerosis subchondral sclerosis under the acromion as well   Impression rotator cuff arthritis and rotator cuff tear  Plan:   Continue therapy come back in 3 weeks decide if she needs an MRI if she does she needs an open unit and Valium for severe claustrophobia

## 2018-08-30 NOTE — Therapy (Signed)
[facility] [place], [place], [address] Phone: [phone]   Fax:  [phone]  Occupational Therapy Treatment  Patient Details  Name: Colleen Lee MRN: [id number] Date of Birth: July 05, 1956 Referring Provider (OT): Dr. Arther Abbott   Encounter Date: 08/30/2018  OT End of Session - 08/30/18 1335    Visit Number  6    Number of Visits  12    Date for OT Re-Evaluation  09/14/18    Authorization Type  Medicare. no visit limit    OT Start Time  1120    OT Stop Time  1150    OT Time Calculation (min)  30 min    Activity Tolerance  Patient tolerated treatment well    Behavior During Therapy  WFL for tasks assessed/performed       Past Medical History:  Diagnosis Date  . Asthma   . Chronic abdominal pain   . Chronic back pain   . Chronic constipation   . Cirrhosis ([place])    Metavir score F4 on elastography 2015  . Cyclical vomiting   . Fibroids   . Fibromyalgia   . GERD (gastroesophageal reflux disease)   . H. pylori infection 2014   treated with pylera, had to be treated again as it was not eradicated. Urea breath test then negative after subsequent treatment.   . [name]    HCV RNA positive 09/2012  . Hypertension   . Marijuana use   . Nausea and vomiting    chronic, recurrent  . PONV (postoperative nausea and vomiting)    pt thinks maybe once she had N&V  . Sciatica of left side     Past Surgical History:  Procedure Laterality Date  . BALLOON DILATION  12/20/2017   Procedure: BALLOON DILATION;  Surgeon: Danie Binder, MD;  [facility];  Service: Endoscopy;;  pyloric dilation  . BIOPSY  12/20/2017   Procedure: BIOPSY;  Surgeon: Danie Binder, MD;  [facility];  Service: Endoscopy;;  duodenal and gastric biopsy  . COLONOSCOPY  01/18/2008   OMB:TDHRCB rectum.  Long redundant colon, a diminutive sigmoid polyp status post cold biopsy removed. Hyperplastic polyp. Repeat  colonoscopy June 2014 due to family history of colon cancer  . COLONOSCOPY WITH ESOPHAGOGASTRODUODENOSCOPY (EGD) N/A 11/02/2012   ULA:[id number] gastric mucosa of doubtful, +H.pylori. Incomplete colonoscopy due to patient unable to tolerate exam, proximal colon seen. Patient refused ACBE.  Marland Kitchen COLONOSCOPY WITH PROPOFOL N/A 03/08/2013   Dr. Gala Romney: colonic polyp-removed as scribed above. Internal Hemorrhoids. Pathology did not reveal any colonic tissue, only mucus. SURVEILLANCE DUE Aug 2019  . COLONOSCOPY WITH PROPOFOL N/A 01/19/2018   Dr. Gala Romney: 6 mm cecal inflammatoy polyp, otherwise normal., Surveilance in 5 year s  . ESOPHAGEAL DILATION  12/20/2017   Procedure: ESOPHAGEAL DILATION;  Surgeon: Danie Binder, MD;  [facility];  Service: Endoscopy;;  . ESOPHAGOGASTRODUODENOSCOPY  01/18/2008   RMR: Normal esophagus, normal  stomach  . ESOPHAGOGASTRODUODENOSCOPY (EGD) WITH PROPOFOL N/A 02/09/2016   Normal esophagus, small hiatal hernia, portal hypertensive gastropathy, normal second portion of duodenum. Due in July 2019  . ESOPHAGOGASTRODUODENOSCOPY (EGD) WITH PROPOFOL N/A 12/20/2017   Dr. Oneida Alar: benign appearing esophageal stenosis s/p dilation, mild pyloric stenosis s/p biopsy and dilation, mild duodenitis.   Marland Kitchen HYSTEROSCOPY  05/11/2016   Procedure: HYSTEROSCOPY;  Surgeon: Jonnie Kind, MD;  Location: [facility];  Service: Gynecology;;  . KNEE SURGERY     left  knee  . MULTIPLE EXTRACTIONS WITH ALVEOLOPLASTY N/A 10/15/2013   Procedure: MULTIPLE EXTRACTION WITH ALVEOLOPLASTY;  Surgeon: Gae Bon, DDS;  Location: [facility];  Service: Oral Surgery;  Laterality: N/A;  . POLYPECTOMY N/A 03/08/2013   Procedure: POLYPECTOMY;  Surgeon: Daneil Dolin, MD;  [facility];  Service: Endoscopy;  Laterality: N/A;  . POLYPECTOMY N/A 05/11/2016   Procedure: REMOVAL OF ENDOMETRIAL POLYP;  Surgeon: Jonnie Kind, MD;  [facility];  Service: Gynecology;  Laterality: N/A;  . TOTAL KNEE ARTHROPLASTY Left  02/05/2015   Procedure: LEFT TOTAL KNEE ARTHROPLASTY;  Surgeon: Carole Civil, MD;  [facility];  Service: Orthopedics;  Laterality: Left;    There were no vitals filed for this visit.  Subjective Assessment - 08/30/18 1134    Subjective   S: I'm to go back in 3 weeks.     Currently in Pain?  No/denies         [facility] OT Assessment - 08/30/18 1135      Assessment   Medical Diagnosis  right shoulder pain      Precautions   Precautions  None               OT Treatments/Exercises (OP) - 08/30/18 1135      Exercises   Exercises  Shoulder      Shoulder Exercises: Supine   Protraction  PROM;5 reps    Horizontal ABduction  PROM;5 reps    External Rotation  PROM;5 reps    Internal Rotation  PROM;5 reps    Flexion  PROM;5 reps    ABduction  PROM;5 reps      Shoulder Exercises: Standing   Protraction  AROM;12 reps    Horizontal ABduction  AROM;12 reps    External Rotation  AROM;12 reps    Internal Rotation  AROM;12 reps    Flexion  AROM;12 reps    ABduction  AROM;12 reps    Extension  Theraband;10 reps    Theraband Level (Shoulder Extension)  Level 2 (Red)    Row  Theraband;10 reps    Theraband Level (Shoulder Row)  Level 2 (Red)    Retraction  Theraband;10 reps    Theraband Level (Shoulder Retraction)  Level 2 (Red)      Shoulder Exercises: ROM/Strengthening   Over Head Lace  seated at [employer] pulley: 2'    Proximal Shoulder Strengthening, Supine  10X A/ROM    Other ROM/Strengthening Exercises  proximal shoulder strengthening using washcloth on door; 1' flexion, 1' abduction      Manual Therapy   Manual Therapy  Myofascial release    Manual therapy comments  manual therapy completed seperately from all other interventions this date.    Myofascial Release  myofascial release and manual stretching to right upper arm, shoulder, and scapular region to decrease pain and improve pain free mobilty in right shoulder region.                OT Short  Term Goals - 08/30/18 1339      OT SHORT TERM GOAL #1   Title  Patient will be educated and independent with HEP to faciliate progress in therapy and allow her to return to using her RUE as her dominant extremity.     Time  3    Period  Weeks    Status  On-going      OT SHORT TERM GOAL #2   Title  Patient will increase RUE P/ROM to WNL in order to  increase ability to complete dressing tasks with less difficulty.     Time  3    Period  Weeks    Status  On-going      OT SHORT TERM GOAL #3   Title  Patient will report a decrease in pain level of approximately 6/10 while completing simple household tasks with her RUE.     Time  3    Period  Weeks    Status  Partially Met        OT Long Term Goals - 08/30/18 1340      OT LONG TERM GOAL #1   Title  Patient will increase her RUE A/ROM to WNL or equal to her left UE in order to reach into cabinets or above shoulder level with less difficulty.     Time  6    Period  Weeks      OT LONG TERM GOAL #2   Title  Patient will increase her RUE strength to 4/5 overall in order to return to flower and gardening activities with increased comfort.    Time  6    Period  Weeks    Status  On-going      OT LONG TERM GOAL #3   Title  Patient will decrease her fascial restrictions to min amount or less in order to increase functional mobility needed to complete reaching tasks.     Time  6    Period  Weeks      OT LONG TERM GOAL #4   Title  Patient will report a decrease in pain of approximately 3/10 or less in her RUE while completing household tasks with her Right arm.    Baseline  6    Period  Weeks    Status  On-going            Plan - 08/30/18 1335    Clinical Impression Statement  A: Added proximal shoulder strengthening standing at door and overhead lacing to focus on shoulder and scapular stability. Session ended short due to muscle fatigue and soreness. VC for form and technique. Added scapular strengthening with max verbal and  physical cueing needed for correct form.     Plan  P: Continue with A/ROM. Added X to V arms and Y arm lift off.     Consulted and Agree with Plan of Care  Patient       Patient will benefit from skilled therapeutic intervention in order to improve the following deficits and impairments:  Decreased range of motion, Increased fascial restrictions, Impaired UE functional use, Pain, Decreased strength  Visit Diagnosis: Chronic right shoulder pain  Stiffness of right shoulder, not elsewhere classified  Other symptoms and signs involving the musculoskeletal system    Problem List Patient Active Problem List   Diagnosis Date Noted  . S/P total knee replacement, 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/24/2018  . Shoulder impingement, right 05/24/2018  . Dehydration   . Intractable cyclical vomiting 25/36/6440  . Pyloric stenosis in adult   . Thrombocytopenia ([place]) 12/20/2017  . Leukocytosis   . Malnutrition of moderate degree 12/17/2017  . Acute renal failure (ARF) ([place]) 12/15/2017  . Chronic abdominal pain 12/15/2017  . Gastroenteritis 06/14/2016  . Nausea with vomiting 06/10/2016  . Uterine enlargement 06/09/2016  . Intractable nausea and vomiting 06/08/2016  . Acute infective gastroenteritis 06/08/2016  . Diarrhea 06/08/2016  . Essential hypertension 06/08/2016  . GERD (gastroesophageal reflux disease) 06/08/2016  . Abdominal pain   . Endometrial polyp 04/15/2016  .  PMB (postmenopausal bleeding) 02/27/2016  . Alcoholic cirrhosis of liver without ascites ([place])   . Asthma 01/21/2016  . Hepatic cirrhosis ([place]) 09/22/2015  . Arthritis of knee, degenerative 02/05/2015  . History of Helicobacter pylori infection 10/30/2014  . Chronic [name] with cirrhosis ([place]) 04/11/2014  . De Quervain's disease (radial styloid tenosynovitis) 12/25/2013  . Anorexia 11/21/2012  . FH: colon cancer 11/21/2012  . Early satiety 10/25/2012  . Bowel habit changes 10/25/2012  . Abdominal pain, epigastric 10/25/2012   . Abdominal bloating 10/25/2012  . Constipation 10/25/2012  . Abnormal weight loss 10/25/2012  . Chronic viral [name] ([place]) 10/25/2012  . Radicular pain of left lower extremity 09/28/2012  . Back pain 09/28/2012  . Sciatica 08/10/2011  . S/P arthroscopy of left knee 08/10/2011  . Tibial plateau fracture 08/10/2011  . Pain in joint, lower leg 02/12/2011  . Stiffness of joint, not elsewhere classified, lower leg 02/12/2011  . 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 Meniscus, medial, derangement 12/29/2010  . CLOSED FRACTURE OF UPPER END OF TIBIA 08/12/2010   Ailene Ravel, OTR/L,CBIS  [phone]  08/30/2018, 1:56 PM  [facility] [address] [place], [place], [address] Phone: [phone]   Fax:  [phone]  Name: Colleen Lee MRN: [id number] Date of Birth: Aug 09, 1955

## 2018-09-03 DIAGNOSIS — J449 Chronic obstructive pulmonary disease, unspecified: Secondary | ICD-10-CM | POA: Diagnosis not present

## 2018-09-05 ENCOUNTER — Ambulatory Visit (HOSPITAL_COMMUNITY): Payer: Medicare Other | Attending: Orthopedic Surgery

## 2018-09-05 ENCOUNTER — Encounter (HOSPITAL_COMMUNITY): Payer: Self-pay

## 2018-09-05 ENCOUNTER — Telehealth (HOSPITAL_COMMUNITY): Payer: Self-pay

## 2018-09-05 DIAGNOSIS — R29898 Other symptoms and signs involving the musculoskeletal system: Secondary | ICD-10-CM | POA: Insufficient documentation

## 2018-09-05 DIAGNOSIS — M25611 Stiffness of right shoulder, not elsewhere classified: Secondary | ICD-10-CM | POA: Diagnosis not present

## 2018-09-05 DIAGNOSIS — G8929 Other chronic pain: Secondary | ICD-10-CM | POA: Diagnosis not present

## 2018-09-05 DIAGNOSIS — M25511 Pain in right shoulder: Secondary | ICD-10-CM | POA: Insufficient documentation

## 2018-09-05 NOTE — Telephone Encounter (Signed)
TALKED TO HUSBAND AND LET PATIENT KNOW THAT HER NEXT APPT WIL BE CX DUE TO OT's IN [facility] ED CLASSES> NF 09/05/2018.

## 2018-09-05 NOTE — Therapy (Signed)
[place], [place], [address] Phone: [phone]   Fax:  [phone]  Occupational Therapy Treatment  Patient Details  Name: Colleen Lee MRN: [id number] Date of Birth: 1956-03-23 Referring Provider (OT): Dr. Arther Abbott   Encounter Date: 09/05/2018  OT End of Session - 09/05/18 1113    Visit Number  7    Number of Visits  12    Date for OT Re-Evaluation  09/14/18    Authorization Type  Medicare. no visit limit    OT Start Time  1040    OT Stop Time  1115    OT Time Calculation (min)  35 min    Activity Tolerance  Patient tolerated treatment well    Behavior During Therapy  WFL for tasks assessed/performed       Past Medical History:  Diagnosis Date  . Asthma   . Chronic abdominal pain   . Chronic back pain   . Chronic constipation   . Cirrhosis ([place])    Metavir score F4 on elastography 2015  . Cyclical vomiting   . Fibroids   . Fibromyalgia   . GERD (gastroesophageal reflux disease)   . H. pylori infection 2014   treated with pylera, had to be treated again as it was not eradicated. Urea breath test then negative after subsequent treatment.   . [name]    HCV RNA positive 09/2012  . Hypertension   . Marijuana use   . Nausea and vomiting    chronic, recurrent  . PONV (postoperative nausea and vomiting)    pt thinks maybe once she had N&V  . Sciatica of left side     Past Surgical History:  Procedure Laterality Date  . BALLOON DILATION  12/20/2017   Procedure: BALLOON DILATION;  Surgeon: Danie Binder, MD;  [facility];  Service: Endoscopy;;  pyloric dilation  . BIOPSY  12/20/2017   Procedure: BIOPSY;  Surgeon: Danie Binder, MD;  [facility];  Service: Endoscopy;;  duodenal and gastric biopsy  . COLONOSCOPY  01/18/2008   IDH:WYSHUO rectum.  Long redundant colon, a diminutive sigmoid polyp status post cold biopsy removed. Hyperplastic polyp. Repeat  colonoscopy June 2014 due to family history of colon cancer  . COLONOSCOPY WITH ESOPHAGOGASTRODUODENOSCOPY (EGD) N/A 11/02/2012   HFG:[id number] gastric mucosa of doubtful, +H.pylori. Incomplete colonoscopy due to patient unable to tolerate exam, proximal colon seen. Patient refused ACBE.  Marland Kitchen COLONOSCOPY WITH PROPOFOL N/A 03/08/2013   Dr. Gala Romney: colonic polyp-removed as scribed above. Internal Hemorrhoids. Pathology did not reveal any colonic tissue, only mucus. SURVEILLANCE DUE Aug 2019  . COLONOSCOPY WITH PROPOFOL N/A 01/19/2018   Dr. Gala Romney: 6 mm cecal inflammatoy polyp, otherwise normal., Surveilance in 5 year s  . ESOPHAGEAL DILATION  12/20/2017   Procedure: ESOPHAGEAL DILATION;  Surgeon: Danie Binder, MD;  [facility];  Service: Endoscopy;;  . ESOPHAGOGASTRODUODENOSCOPY  01/18/2008   RMR: Normal esophagus, normal  stomach  . ESOPHAGOGASTRODUODENOSCOPY (EGD) WITH PROPOFOL N/A 02/09/2016   Normal esophagus, small hiatal hernia, portal hypertensive gastropathy, normal second portion of duodenum. Due in July 2019  . ESOPHAGOGASTRODUODENOSCOPY (EGD) WITH PROPOFOL N/A 12/20/2017   Dr. Oneida Alar: benign appearing esophageal stenosis s/p dilation, mild pyloric stenosis s/p biopsy and dilation, mild duodenitis.   Marland Kitchen HYSTEROSCOPY  05/11/2016   Procedure: HYSTEROSCOPY;  Surgeon: Jonnie Kind, MD;  [facility];  Service: Gynecology;;  . KNEE SURGERY     left  knee  . MULTIPLE EXTRACTIONS WITH ALVEOLOPLASTY N/A 10/15/2013   Procedure: MULTIPLE EXTRACTION WITH ALVEOLOPLASTY;  Surgeon: Gae Bon, DDS;  Location: [facility];  Service: Oral Surgery;  Laterality: N/A;  . POLYPECTOMY N/A 03/08/2013   Procedure: POLYPECTOMY;  Surgeon: Daneil Dolin, MD;  [facility];  Service: Endoscopy;  Laterality: N/A;  . POLYPECTOMY N/A 05/11/2016   Procedure: REMOVAL OF ENDOMETRIAL POLYP;  Surgeon: Jonnie Kind, MD;  [facility];  Service: Gynecology;  Laterality: N/A;  . TOTAL KNEE ARTHROPLASTY Left  02/05/2015   Procedure: LEFT TOTAL KNEE ARTHROPLASTY;  Surgeon: Carole Civil, MD;  [facility];  Service: Orthopedics;  Laterality: Left;    There were no vitals filed for this visit.  Subjective Assessment - 09/05/18 1052    Subjective   S: I did some house cleaning yesterday so I'm a little sore but it doesn't hurt.     Currently in Pain?  No/denies         [facility] OT Assessment - 09/05/18 1052      Assessment   Medical Diagnosis  right shoulder pain      Precautions   Precautions  None               OT Treatments/Exercises (OP) - 09/05/18 1052      Exercises   Exercises  Shoulder      Shoulder Exercises: Supine   Protraction  PROM;5 reps    Horizontal ABduction  PROM;5 reps    External Rotation  PROM;5 reps    Internal Rotation  PROM;5 reps    Flexion  PROM;5 reps    ABduction  PROM;5 reps      Shoulder Exercises: Standing   Protraction  AROM;12 reps    Horizontal ABduction  AROM;12 reps    External Rotation  AROM;12 reps    Internal Rotation  AROM;12 reps    Flexion  AROM;12 reps    ABduction  AROM;12 reps    Other Standing Exercises  Y arms lift off; 10X      Shoulder Exercises: ROM/Strengthening   UBE (Upper Arm Bike)  Level 1 2' forward 2' reverse   pace: 4.5-5.5   Over Head Lace  seated at [employer] pulley: 2'    X to V Arms  10X    Other ROM/Strengthening Exercises  proximal shoulder strengthening using washcloth on door; 1' flexion, 1' abduction      Manual Therapy   Manual Therapy  Myofascial release    Manual therapy comments  manual therapy completed seperately from all other interventions this date.    Myofascial Release  myofascial release and manual stretching to right upper arm, shoulder, and scapular region to decrease pain and improve pain free mobilty in right shoulder region.                OT Short Term Goals - 08/30/18 1339      OT SHORT TERM GOAL #1   Title  Patient will be educated and independent with HEP  to faciliate progress in therapy and allow her to return to using her RUE as her dominant extremity.     Time  3    Period  Weeks    Status  On-going      OT SHORT TERM GOAL #2   Title  Patient will increase RUE P/ROM to WNL in order to increase ability to complete dressing tasks with less difficulty.     Time  3  Period  Weeks    Status  On-going      OT SHORT TERM GOAL #3   Title  Patient will report a decrease in pain level of approximately 6/10 while completing simple household tasks with her RUE.     Time  3    Period  Weeks    Status  Partially Met        OT Long Term Goals - 08/30/18 1340      OT LONG TERM GOAL #1   Title  Patient will increase her RUE A/ROM to WNL or equal to her left UE in order to reach into cabinets or above shoulder level with less difficulty.     Time  6    Period  Weeks      OT LONG TERM GOAL #2   Title  Patient will increase her RUE strength to 4/5 overall in order to return to flower and gardening activities with increased comfort.    Time  6    Period  Weeks    Status  On-going      OT LONG TERM GOAL #3   Title  Patient will decrease her fascial restrictions to min amount or less in order to increase functional mobility needed to complete reaching tasks.     Time  6    Period  Weeks      OT LONG TERM GOAL #4   Title  Patient will report a decrease in pain of approximately 3/10 or less in her RUE while completing household tasks with her Right arm.    Baseline  6    Period  Weeks    Status  On-going            Plan - 09/05/18 1113    Clinical Impression Statement  A: Patient fatigued during session with all exercises. She required small breaks breaks between exercises. VC for form and technique were needed for completion. Fascial restrictions continue to decrease although present in right anterior and medial deltoid.     Plan  P: Continue with scapular theraband exercises. Focus on scapular and shoulder stability. Add therapy  ball exercises for shoulder strength and stability.     Consulted and Agree with Plan of Care  Patient       Patient will benefit from skilled therapeutic intervention in order to improve the following deficits and impairments:  Decreased range of motion, Increased fascial restrictions, Impaired UE functional use, Pain, Decreased strength  Visit Diagnosis: Chronic right shoulder pain  Stiffness of right shoulder, not elsewhere classified  Other symptoms and signs involving the musculoskeletal system    Problem List Patient Active Problem List   Diagnosis Date Noted  . S/P total knee replacement, 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/24/2018  . Shoulder impingement, right 05/24/2018  . Dehydration   . Intractable cyclical vomiting 93/90/3009  . Pyloric stenosis in adult   . Thrombocytopenia ([place]) 12/20/2017  . Leukocytosis   . Malnutrition of moderate degree 12/17/2017  . Acute renal failure (ARF) ([place]) 12/15/2017  . Chronic abdominal pain 12/15/2017  . Gastroenteritis 06/14/2016  . Nausea with vomiting 06/10/2016  . Uterine enlargement 06/09/2016  . Intractable nausea and vomiting 06/08/2016  . Acute infective gastroenteritis 06/08/2016  . Diarrhea 06/08/2016  . Essential hypertension 06/08/2016  . GERD (gastroesophageal reflux disease) 06/08/2016  . Abdominal pain   . Endometrial polyp 04/15/2016  . PMB (postmenopausal bleeding) 02/27/2016  . Alcoholic cirrhosis of liver without ascites ([place])   . Asthma 01/21/2016  .  Hepatic cirrhosis ([place]) 09/22/2015  . Arthritis of knee, degenerative 02/05/2015  . History of Helicobacter pylori infection 10/30/2014  . Chronic [name] with cirrhosis ([place]) 04/11/2014  . De Quervain's disease (radial styloid tenosynovitis) 12/25/2013  . Anorexia 11/21/2012  . FH: colon cancer 11/21/2012  . Early satiety 10/25/2012  . Bowel habit changes 10/25/2012  . Abdominal pain, epigastric 10/25/2012  . Abdominal bloating 10/25/2012  . Constipation 10/25/2012   . Abnormal weight loss 10/25/2012  . Chronic viral [name] ([place]) 10/25/2012  . Radicular pain of left lower extremity 09/28/2012  . Back pain 09/28/2012  . Sciatica 08/10/2011  . S/P arthroscopy of left knee 08/10/2011  . Tibial plateau fracture 08/10/2011  . Pain in joint, lower leg 02/12/2011  . Stiffness of joint, not elsewhere classified, lower leg 02/12/2011  . 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 Meniscus, medial, derangement 12/29/2010  . CLOSED FRACTURE OF UPPER END OF TIBIA 08/12/2010   Ailene Ravel, OTR/L,CBIS  [phone]  09/05/2018, 11:29 AM  [facility] [address] [place], [place], [address] Phone: [phone]   Fax:  [phone]  Name: Colleen Lee MRN: [id number] Date of Birth: 1956-07-20

## 2018-09-06 DIAGNOSIS — Z79891 Long term (current) use of opiate analgesic: Secondary | ICD-10-CM | POA: Diagnosis not present

## 2018-09-06 DIAGNOSIS — M13862 Other specified arthritis, left knee: Secondary | ICD-10-CM | POA: Diagnosis not present

## 2018-09-06 DIAGNOSIS — G894 Chronic pain syndrome: Secondary | ICD-10-CM | POA: Diagnosis not present

## 2018-09-06 DIAGNOSIS — M461 Sacroiliitis, not elsewhere classified: Secondary | ICD-10-CM | POA: Diagnosis not present

## 2018-09-08 ENCOUNTER — Encounter (HOSPITAL_COMMUNITY): Payer: Medicaid Other

## 2018-09-12 ENCOUNTER — Ambulatory Visit (HOSPITAL_COMMUNITY): Payer: Medicare Other

## 2018-09-12 ENCOUNTER — Telehealth (HOSPITAL_COMMUNITY): Payer: Self-pay | Admitting: Internal Medicine

## 2018-09-12 NOTE — Telephone Encounter (Signed)
09/12/18  pt left a message to cx said she didn't feel well this morning

## 2018-09-15 ENCOUNTER — Telehealth (HOSPITAL_COMMUNITY): Payer: Self-pay | Admitting: Internal Medicine

## 2018-09-15 ENCOUNTER — Ambulatory Visit (HOSPITAL_COMMUNITY): Payer: Medicare Other

## 2018-09-15 NOTE — Telephone Encounter (Signed)
09/15/18  pt cx still sick and went to the ED last night ... she wants me to call back to reschedule the appts that she missed

## 2018-09-26 ENCOUNTER — Telehealth (HOSPITAL_COMMUNITY): Payer: Self-pay | Admitting: Internal Medicine

## 2018-09-26 NOTE — Telephone Encounter (Signed)
09/26/18  Pt had cancelled some appts because she was sick.  I called to see if she wanted to reschedule them but I had to leave a message

## 2018-09-27 ENCOUNTER — Encounter: Payer: Self-pay | Admitting: Orthopedic Surgery

## 2018-09-27 ENCOUNTER — Ambulatory Visit (INDEPENDENT_AMBULATORY_CARE_PROVIDER_SITE_OTHER): Payer: Medicare Other | Admitting: Orthopedic Surgery

## 2018-09-27 VITALS — BP 99/57 | HR 73 | Ht 66.0 in | Wt 174.0 lb

## 2018-09-27 DIAGNOSIS — G8929 Other chronic pain: Secondary | ICD-10-CM

## 2018-09-27 DIAGNOSIS — M12811 Other specific arthropathies, not elsewhere classified, right shoulder: Secondary | ICD-10-CM | POA: Diagnosis not present

## 2018-09-27 DIAGNOSIS — M25511 Pain in right shoulder: Secondary | ICD-10-CM

## 2018-09-27 MED ORDER — HYDROCODONE-ACETAMINOPHEN 5-325 MG PO TABS: 1.0000 | ORAL_TABLET | ORAL | 0 refills | Status: AC | PRN

## 2018-09-27 NOTE — Addendum Note (Signed)
Addended byCandice Camp on: 09/27/2018 10:48 AM   Modules accepted: Orders

## 2018-09-27 NOTE — Progress Notes (Signed)
Progress Note   Patient ID: Colleen Lee, female   DOB: 1956/06/26, 63 y.o.   MRN: [id number]   Chief Complaint  Patient presents with  . Follow-up    Right chronic shoulder pain     63 year old female comes back with continued pain in the right shoulder keeping up at night she is having progressive loss of motion in the right shoulder and the ibuprofen is making her sick on the stomach  LAST VISIT   Fu after PT   She says the shoulder is better after several weeks of therapy, she says there are still certain movements she does such as sweeping mopping washing dishes that bother   Last visit: 63 year old female presents for follow-up last seen October 23 for her right shoulder.  She has atraumatic pain right shoulder painful range of motion difficulty with activities of daily living no improvement after 1 subacromial injection x-ray shows rotator cuff arthropathy with acromiohumeral distance 8.5 slight proximal migration of the humerus without arthritis.    Review of Systems  Musculoskeletal: Negative for neck pain.  Neurological: Negative for tingling.     Allergies  Allergen Reactions  . Penicillins Rash    Has patient had a PCN reaction causing immediate rash, facial/tongue/throat swelling, SOB or lightheadedness with hypotension: Yes Has patient had a PCN reaction causing severe rash involving mucus membranes or skin necrosis: No Has patient had a PCN reaction that required hospitalization No Has patient had a PCN reaction occurring within the last 10 years: No If all of the above answers are "NO", then may proceed with Cephalosporin use.      BP (!) 99/57   Pulse 73   Ht 5\' 6"  (1.676 m)   Wt 174 lb (78.9 kg)   BMI 28.08 kg/m   Physical Exam   Medical decisions:   Data  Imaging:   Previous x-ray did show some break in Shenton's line of the shoulder proximal migration of the humerus consistent with a chronic rotator cuff problem  Encounter Diagnosis   Name Primary?  . Rotator cuff arthropathy of right shoulder Yes    PLAN:    Current Outpatient Medications:  .  albuterol (PROVENTIL HFA;VENTOLIN HFA) 108 (90 Base) MCG/ACT inhaler, Inhale into the lungs every 6 (six) hours as needed for wheezing or shortness of breath., Disp: , Rfl:  .  budesonide-formoterol (SYMBICORT) 160-4.5 MCG/ACT inhaler, Inhale 2 puffs into the lungs 2 (two) times daily., Disp: , Rfl:  .  Cyanocobalamin (VITAMIN B 12 PO), Take 1 tablet by mouth daily., Disp: , Rfl:  .  cyclobenzaprine (FLEXERIL) 10 MG tablet, Take 10 mg by mouth 3 (three) times daily as needed for muscle spasms., Disp: , Rfl:  .  dexamethasone (DECADRON) 4 MG tablet, Take 1 tablet (4 mg total) by mouth 2 (two) times daily with a meal. (Patient not taking: Reported on 08/30/2018), Disp: 10 tablet, Rfl: 0 .  doxycycline (VIBRAMYCIN) 100 MG capsule, Take 1 capsule (100 mg total) by mouth 2 (two) times daily., Disp: 14 capsule, Rfl: 0 .  DULoxetine (CYMBALTA) 30 MG capsule, Take 1 capsule by mouth 2 (two) times daily., Disp: , Rfl:  .  furosemide (LASIX) 20 MG tablet, Take 1 tablet (20 mg total) by mouth daily., Disp: 30 tablet, Rfl: 1 .  hydrALAZINE (APRESOLINE) 50 MG tablet, Take 1 tablet (50 mg total) by mouth every 8 (eight) hours., Disp: 90 tablet, Rfl: 1 .  HYDROcodone-acetaminophen (NORCO/VICODIN) 5-325 MG tablet, Take 1 tablet by mouth  as needed for up to 7 days for moderate pain., Disp: 30 tablet, Rfl: 0 .  hydrOXYzine (ATARAX/VISTARIL) 10 MG tablet, Take 10 mg by mouth daily. , Disp: , Rfl:  .  labetalol (NORMODYNE) 100 MG tablet, Take 2 tablets (200 mg total) by mouth 2 (two) times daily., Disp: 120 tablet, Rfl: 1 .  losartan (COZAAR) 100 MG tablet, Take 1 tablet (100 mg total) by mouth daily., Disp: 30 tablet, Rfl: 1 .  losartan-hydrochlorothiazide (HYZAAR) 100-12.5 MG tablet, , Disp: , Rfl:  .  megestrol (MEGACE) 40 MG tablet, Take 40 mg by mouth daily., Disp: , Rfl:  .  metoCLOPramide  (REGLAN) 10 MG tablet, , Disp: , Rfl:  .  ondansetron (ZOFRAN ODT) 4 MG disintegrating tablet, Take 1 tablet (4 mg total) by mouth 3 (three) times daily. 4mg  ODT q4 hours prn nausea/vomit (Patient not taking: Reported on 08/30/2018), Disp: 60 tablet, Rfl: 0 .  pantoprazole (PROTONIX) 40 MG tablet, Take 1 tablet (40 mg total) by mouth 2 (two) times daily before a meal. (Patient not taking: Reported on 08/30/2018), Disp: 60 tablet, Rfl: 0 .  potassium chloride SA (K-DUR,KLOR-CON) 20 MEQ tablet, Take 20 mEq by mouth 2 (two) times daily., Disp: , Rfl:  .  promethazine-dextromethorphan (PROMETHAZINE-DM) 6.25-15 MG/5ML syrup, Take 10 mLs by mouth 4 (four) times daily as needed. (Patient not taking: Reported on 08/30/2018), Disp: 118 mL, Rfl: 0 .  traMADol (ULTRAM) 50 MG tablet, 1 or 2 po q6h prn pain (Patient not taking: Reported on 08/30/2018), Disp: 12 tablet, Rfl: 0 .  TRULANCE 3 MG TABS, TAKE 1 TABLET BY MOUTH ONCE A DAY., Disp: 30 tablet, Rfl: 3  Meds ordered this encounter  Medications  . HYDROcodone-acetaminophen (NORCO/VICODIN) 5-325 MG tablet    Sig: Take 1 tablet by mouth as needed for up to 7 days for moderate pain.    Dispense:  30 tablet    Refill:  0    MRI of the right shoulder for rotator cuff tear    Arther Abbott, MD 09/27/2018 10:28 AM

## 2018-09-27 NOTE — Patient Instructions (Signed)
Call [facility] to schedule your MRI [phone]

## 2018-10-02 ENCOUNTER — Encounter (HOSPITAL_COMMUNITY): Payer: Self-pay

## 2018-10-02 DIAGNOSIS — J449 Chronic obstructive pulmonary disease, unspecified: Secondary | ICD-10-CM | POA: Diagnosis not present

## 2018-10-02 NOTE — Therapy (Signed)
[facility] [place], [place], [address] Phone: [phone]   Fax:  [phone]  Patient Details  Name: Colleen Lee MRN: [id number] Date of Birth: Apr 06, 1956 Referring Provider:  No ref. provider found  Encounter Date: 10/02/2018  OCCUPATIONAL THERAPY DISCHARGE SUMMARY  Visits from Start of Care: 7  Current functional level related to goals / functional outcomes:      OT LONG TERM GOAL #1   Title  Patient will increase her RUE A/ROM to WNL or equal to her left UE in order to reach into cabinets or above shoulder level with less difficulty.     Time  6    Period  Weeks        OT LONG TERM GOAL #2   Title  Patient will increase her RUE strength to 4/5 overall in order to return to flower and gardening activities with increased comfort.    Time  6    Period  Weeks    Status  On-going        OT LONG TERM GOAL #3   Title  Patient will decrease her fascial restrictions to min amount or less in order to increase functional mobility needed to complete reaching tasks.     Time  6    Period  Weeks        OT LONG TERM GOAL #4   Title  Patient will report a decrease in pain of approximately 3/10 or less in her RUE while completing household tasks with her Right arm.    Baseline  6    Period  Weeks    Status  On-going         Remaining deficits: Patient did not return since last scheduled appointment 4 weeks ago. Patient has been sick and did not return call to reschedule. Unknown regarding remaining deficits.    Education / Equipment: Shoulder HEP Plan: Patient agrees to discharge.  Patient goals were not met. Patient is being discharged due to not returning since the last visit.  ?????         Ailene Ravel, OTR/L,CBIS  [phone]  10/02/2018, 4:44 PM  [facility] [address] [place], [place], [address] Phone: [phone]   Fax:   [phone]

## 2018-10-05 ENCOUNTER — Other Ambulatory Visit: Payer: Self-pay | Admitting: Orthopedic Surgery

## 2018-10-10 NOTE — Progress Notes (Signed)
Referring Provider: Rosita Fire, MD Primary Care Physician:  Rosita Fire, MD Primary GI: Dr. Gala Romney   Chief Complaint  Patient presents with  . Diarrhea    HPI:   Colleen Lee is a 63 y.o. 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 EGD several months ago with benign appearing esophageal stenosis s/p dilation, mild pyloric stenosis s/p dilation, mild duodenitis. Evaluated by Vascular previously with recommendations to return as needed.   Constipation: has taken Trulance and done well. However, she states she now has been having loose stool for 2 months. Still taking Trulance. Occasional "pinch" in LLQ but not often.   Cirrhosis: due for ultrasound now. She notes her abdomen feels bigger but no lower extremity edema. No confusion or mental status changes. Denies jaundice. Drinks 3 beers a week.  GERD: Protonix BID controls GERD symptoms. Needs refill.   Past Medical History:  Diagnosis Date  . Asthma   . Chronic abdominal pain   . Chronic back pain   . Chronic constipation   . Cirrhosis ([place])    Metavir score F4 on elastography 2015  . Cyclical vomiting   . Fibroids   . Fibromyalgia   . GERD (gastroesophageal reflux disease)   . H. pylori infection 2014   treated with pylera, had to be treated again as it was not eradicated. Urea breath test then negative after subsequent treatment.   . [name]    HCV RNA positive 09/2012  . Hypertension   . Marijuana use   . Nausea and vomiting    chronic, recurrent  . PONV (postoperative nausea and vomiting)    pt thinks maybe once she had N&V  . Sciatica of left side      Past Surgical History:  Procedure Laterality Date  . BALLOON DILATION  12/20/2017   Procedure: BALLOON DILATION;  Surgeon: Danie Binder, MD;  [facility];  Service: Endoscopy;;  pyloric dilation  . BIOPSY  12/20/2017   Procedure: BIOPSY;  Surgeon: Danie Binder, MD;  [facility];  Service: Endoscopy;;  duodenal and gastric biopsy  . COLONOSCOPY  01/18/2008   TSV:[id number] rectum.  Long redundant colon, a diminutive sigmoid polyp status post cold biopsy removed. Hyperplastic polyp. Repeat colonoscopy June 2014 due to family history of colon cancer  . COLONOSCOPY WITH ESOPHAGOGASTRODUODENOSCOPY (EGD) N/A 11/02/2012   ZES:[id number] gastric mucosa of doubtful, +H.pylori. Incomplete colonoscopy due to patient unable to tolerate exam, proximal colon seen. Patient refused ACBE.  Marland Kitchen COLONOSCOPY WITH PROPOFOL N/A 03/08/2013   Dr. Gala Romney: colonic polyp-removed as scribed above. Internal Hemorrhoids. Pathology did not reveal any colonic tissue, only mucus. SURVEILLANCE DUE Aug 2019  . COLONOSCOPY WITH PROPOFOL N/A 01/19/2018   Dr. Gala Romney: 6 mm cecal inflammatoy polyp, otherwise normal., Surveilance in 5 year s  . ESOPHAGEAL DILATION  12/20/2017   Procedure: ESOPHAGEAL DILATION;  Surgeon: Danie Binder, MD;  [facility];  Service: Endoscopy;;  . ESOPHAGOGASTRODUODENOSCOPY  01/18/2008   RMR: Normal esophagus, normal  stomach  . ESOPHAGOGASTRODUODENOSCOPY (EGD) WITH PROPOFOL N/A 02/09/2016   Normal esophagus, small hiatal hernia, portal hypertensive gastropathy, normal second portion of duodenum. Due in July 2019  .  ESOPHAGOGASTRODUODENOSCOPY (EGD) WITH PROPOFOL N/A 12/20/2017   Dr. Oneida Alar: benign appearing esophageal stenosis s/p dilation, mild pyloric stenosis s/p biopsy and dilation, mild duodenitis.   Marland Kitchen HYSTEROSCOPY  05/11/2016   Procedure: HYSTEROSCOPY;  Surgeon: Jonnie Kind, MD;  Location: [facility];  Service: Gynecology;;  . KNEE SURGERY     left knee  . MULTIPLE  EXTRACTIONS WITH ALVEOLOPLASTY N/A 10/15/2013   Procedure: MULTIPLE EXTRACTION WITH ALVEOLOPLASTY;  Surgeon: Gae Bon, DDS;  Location: [facility];  Service: Oral Surgery;  Laterality: N/A;  . POLYPECTOMY N/A 03/08/2013   Procedure: POLYPECTOMY;  Surgeon: Daneil Dolin, MD;  Location: [facility];  Service: Endoscopy;  Laterality: N/A;  . POLYPECTOMY N/A 05/11/2016   Procedure: REMOVAL OF ENDOMETRIAL POLYP;  Surgeon: Jonnie Kind, MD;  Location: [facility];  Service: Gynecology;  Laterality: N/A;  . TOTAL KNEE ARTHROPLASTY Left 02/05/2015   Procedure: LEFT TOTAL KNEE ARTHROPLASTY;  Surgeon: Carole Civil, MD;  Location: [facility];  Service: Orthopedics;  Laterality: Left;    Current Outpatient Medications  Medication Sig Dispense Refill  . albuterol (PROVENTIL HFA;VENTOLIN HFA) 108 (90 Base) MCG/ACT inhaler Inhale into the lungs every 6 (six) hours as needed for wheezing or shortness of breath.    . budesonide-formoterol (SYMBICORT) 160-4.5 MCG/ACT inhaler Inhale 2 puffs into the lungs 2 (two) times daily.    . Cyanocobalamin (VITAMIN B 12 PO) Take 1 tablet by mouth daily.    . furosemide (LASIX) 20 MG tablet Take 1 tablet (20 mg total) by mouth daily. 30 tablet 1  . hydrALAZINE (APRESOLINE) 50 MG tablet Take 1 tablet (50 mg total) by mouth every 8 (eight) hours. 90 tablet 1  . hydrochlorothiazide (HYDRODIURIL) 12.5 MG tablet Take 1 tablet by mouth daily.    Marland Kitchen HYDROcodone-acetaminophen (NORCO/VICODIN) 5-325 MG tablet TAKE 1 TABLET BY MOUTH AS NEEDED FOR UP TO 7 DAYS FOR MODERATE PAIN. 30 tablet 0  . hydrOXYzine (ATARAX/VISTARIL) 10 MG tablet Take 10 mg by mouth daily.     Marland Kitchen labetalol (NORMODYNE) 100 MG tablet Take 2 tablets (200 mg total) by mouth 2 (two) times daily. 120 tablet 1  . losartan (COZAAR) 100 MG tablet Take 1 tablet (100 mg total) by mouth daily. 30 tablet 1  . megestrol (MEGACE) 40 MG tablet Take 40 mg by mouth daily.    . metoCLOPramide (REGLAN) 10 MG tablet     . ondansetron (ZOFRAN  ODT) 4 MG disintegrating tablet Take 1 tablet (4 mg total) by mouth 3 (three) times daily. '4mg'$  ODT q4 hours prn nausea/vomit 60 tablet 0  . pantoprazole (PROTONIX) 40 MG tablet Take 1 tablet (40 mg total) by mouth 2 (two) times daily before a meal. 60 tablet 0  . TRULANCE 3 MG TABS TAKE 1 TABLET BY MOUTH ONCE A DAY. 30 tablet 3   No current facility-administered medications for this visit.     Allergies as of 10/12/2018 - Review Complete 10/12/2018  Allergen Reaction Noted  . Penicillins Rash     Family History  Problem Relation Age of Onset  . Colon cancer Brother 3          . Multiple myeloma Brother   . Liver cancer Sister   . Prostate cancer Brother   . Pancreatic cancer Brother   . Cancer Mother        breast  . Asthma Mother     Social History   Socioeconomic History  . Marital status: Single    Spouse name: Not  on file  . Number of children: 0  . Years of education: Not on file  . Highest education level: Not on file  Occupational History  . Occupation: umemployed   Social Needs  . Financial resource strain: Not on file  . Food insecurity:    Worry: Not on file    Inability: Not on file  . Transportation needs:    Medical: Not on file    Non-medical: Not on file  Tobacco Use  . Smoking status: Current Every Day Smoker    Packs/day: 1.00    Years: 40.00    Pack years: 40.00    Types: Cigarettes  . Smokeless tobacco: Never Used  . Tobacco comment: Smokes one pack of cigarettes daily  Substance and Sexual Activity  . Alcohol use: Yes    Alcohol/week: 0.0 standard drinks    Comment: occas  . Drug use: Not Currently    Types: Marijuana    Comment: history of marijuana in past  . Sexual activity: Never    Birth control/protection: None  Lifestyle  . Physical activity:    Days per week: Not on file    Minutes per session: Not on file  . Stress: Not on file  Relationships  . Social connections:    Talks on phone: Not on file    Gets together: Not on  file    Attends religious service: Not on file    Active member of club or organization: Not on file    Attends meetings of clubs or organizations: Not on file    Relationship status: Not on file  Other Topics Concern  . Not on file  Social History Narrative  . Not on file    Review of Systems: Gen: Denies fever, chills, anorexia. Denies fatigue, weakness, weight loss.  CV: Denies chest pain, palpitations, syncope, peripheral edema, and claudication. Resp: Denies dyspnea at rest, cough, wheezing, coughing up blood, and pleurisy. GI: see HPI Derm: Denies rash, itching, dry skin Psych: Denies depression, anxiety, memory loss, confusion. No homicidal or suicidal ideation.  Heme: Denies bruising, bleeding, and enlarged lymph nodes.  Physical Exam: BP (!) 87/61   Pulse 70   Temp (!) 97.1 F (36.2 C) (Oral)   Ht '5\' 6"'$  (1.676 m)   Wt 185 lb 3.2 oz (84 kg)   BMI 29.89 kg/m  General:   Alert and oriented. No distress noted. Pleasant and cooperative.  Head:  Normocephalic and atraumatic. Eyes:  Conjuctiva clear without scleral icterus. Mouth:  Oral mucosa pink and moist.  Abdomen:  +BS, distended/round but soft, non-tender. No rebound or guarding. No HSM or masses noted. Msk:  Symmetrical without gross deformities. Normal posture. Extremities:  Without edema. Neurologic:  Alert and  oriented x4 Psych:  Alert and cooperative. Normal mood and affect.

## 2018-10-12 ENCOUNTER — Encounter: Payer: Self-pay | Admitting: Internal Medicine

## 2018-10-12 ENCOUNTER — Other Ambulatory Visit: Payer: Self-pay

## 2018-10-12 ENCOUNTER — Encounter: Payer: Self-pay | Admitting: Gastroenterology

## 2018-10-12 ENCOUNTER — Telehealth: Payer: Self-pay | Admitting: *Deleted

## 2018-10-12 ENCOUNTER — Other Ambulatory Visit: Payer: Self-pay | Admitting: *Deleted

## 2018-10-12 ENCOUNTER — Ambulatory Visit (INDEPENDENT_AMBULATORY_CARE_PROVIDER_SITE_OTHER): Payer: Medicare Other | Admitting: Gastroenterology

## 2018-10-12 ENCOUNTER — Telehealth: Payer: Self-pay | Admitting: Gastroenterology

## 2018-10-12 VITALS — BP 87/61 | HR 70 | Temp 97.1°F | Ht 66.0 in | Wt 185.2 lb

## 2018-10-12 DIAGNOSIS — K7460 Unspecified cirrhosis of liver: Secondary | ICD-10-CM

## 2018-10-12 DIAGNOSIS — K5900 Constipation, unspecified: Secondary | ICD-10-CM | POA: Diagnosis not present

## 2018-10-12 DIAGNOSIS — K219 Gastro-esophageal reflux disease without esophagitis: Secondary | ICD-10-CM

## 2018-10-12 NOTE — Telephone Encounter (Signed)
Spoke with pt and she stated she had already called back and someone informed her of the appt details.

## 2018-10-12 NOTE — Telephone Encounter (Signed)
Pt AVS and lab orders mailed. Lmom, to notify pt.

## 2018-10-12 NOTE — Assessment & Plan Note (Signed)
With history of ETOH use, Hep C s/p treatment and achieving SVR. Well-compensated. She tends to gain weight mainly around her midsection, and I do not appreciate any ascites on exam. Due for [place], so we will pursue US abdomen complete now. Labs ordered after visit, due to downtime with epic. Return in 2 months.

## 2018-10-12 NOTE — Assessment & Plan Note (Signed)
Historically taking Trulance but now with diarrhea daily. Continues to take Trulance. I have asked her to stop this for now. If persistent diarrhea despite holding Trulance, will pursue stool studies.

## 2018-10-12 NOTE — Patient Instructions (Signed)
Please stop Trulance. Call me if diarrhea persists after stopping.  We have arranged an ultrasound for routine purposes.  Please do not take your evening blood pressure medications. Please call or go to Dr. Josephine Cables office today as your BP medications need to be adjusted.  I will see you in 2 months!  I enjoyed seeing you again today! As you know, I value our relationship and want to provide genuine, compassionate, and quality care. I welcome your feedback. If you receive a survey regarding your visit,  I greatly appreciate you taking time to fill this out. See you next time!  Annitta Needs, PhD, ANP-BC [facility]

## 2018-10-12 NOTE — Telephone Encounter (Signed)
Alicia: I was unable to print labs at time of visit. Please let her know I would like to update labs as well. I have ordered/printed already.

## 2018-10-12 NOTE — Progress Notes (Signed)
cc'ed to pcp °

## 2018-10-12 NOTE — Assessment & Plan Note (Signed)
Doing well with PPI BID. Refills called into [place].

## 2018-10-12 NOTE — Telephone Encounter (Signed)
U/s abd complete scheduled for 10/18/2018 at 10:30am, arrival time 10:15am, NPO after midnight  LMOVM for pt to call back

## 2018-10-16 ENCOUNTER — Other Ambulatory Visit: Payer: Self-pay | Admitting: Orthopedic Surgery

## 2018-10-17 ENCOUNTER — Other Ambulatory Visit: Payer: Medicaid Other

## 2018-10-17 NOTE — Telephone Encounter (Signed)
Pt.notified

## 2018-10-18 ENCOUNTER — Ambulatory Visit (HOSPITAL_COMMUNITY)
Admission: RE | Admit: 2018-10-18 | Discharge: 2018-10-18 | Disposition: A | Discharge status: Home / Self Care | Payer: Medicare Other | Source: Ambulatory Visit | Attending: Gastroenterology | Admitting: Gastroenterology

## 2018-10-18 ENCOUNTER — Other Ambulatory Visit: Payer: Self-pay

## 2018-10-18 DIAGNOSIS — K7460 Unspecified cirrhosis of liver: Secondary | ICD-10-CM | POA: Insufficient documentation

## 2018-10-18 DIAGNOSIS — R109 Unspecified abdominal pain: Secondary | ICD-10-CM | POA: Diagnosis not present

## 2018-10-19 NOTE — Progress Notes (Signed)
Ultrasound reviewed. No acute findings. Repeat in 6 months.

## 2018-10-24 DIAGNOSIS — J449 Chronic obstructive pulmonary disease, unspecified: Secondary | ICD-10-CM | POA: Diagnosis not present

## 2018-10-24 DIAGNOSIS — K219 Gastro-esophageal reflux disease without esophagitis: Secondary | ICD-10-CM | POA: Diagnosis not present

## 2018-10-24 DIAGNOSIS — I10 Essential (primary) hypertension: Secondary | ICD-10-CM | POA: Diagnosis not present

## 2018-10-31 DIAGNOSIS — M461 Sacroiliitis, not elsewhere classified: Secondary | ICD-10-CM | POA: Diagnosis not present

## 2018-10-31 DIAGNOSIS — G894 Chronic pain syndrome: Secondary | ICD-10-CM | POA: Diagnosis not present

## 2018-10-31 DIAGNOSIS — M13862 Other specified arthritis, left knee: Secondary | ICD-10-CM | POA: Diagnosis not present

## 2018-10-31 DIAGNOSIS — Z79891 Long term (current) use of opiate analgesic: Secondary | ICD-10-CM | POA: Diagnosis not present

## 2018-11-02 DIAGNOSIS — J449 Chronic obstructive pulmonary disease, unspecified: Secondary | ICD-10-CM | POA: Diagnosis not present

## 2018-11-06 ENCOUNTER — Other Ambulatory Visit: Payer: Self-pay | Admitting: Orthopedic Surgery

## 2018-11-06 ENCOUNTER — Telehealth (INDEPENDENT_AMBULATORY_CARE_PROVIDER_SITE_OTHER): Payer: Self-pay | Admitting: Radiology

## 2018-11-06 MED ORDER — HYDROCODONE-ACETAMINOPHEN 5-325 MG PO TABS: ORAL_TABLET | ORAL | 0 refills | Status: DC

## 2018-11-06 NOTE — Telephone Encounter (Signed)
Patient called, and said that her MRI is now scheduled on 01/03/19.  She is asking for some medication for pain to be sent to [facility].

## 2018-11-07 ENCOUNTER — Other Ambulatory Visit: Payer: Medicaid Other

## 2018-11-13 ENCOUNTER — Ambulatory Visit: Payer: Medicare Other | Admitting: Orthopedic Surgery

## 2018-11-15 ENCOUNTER — Other Ambulatory Visit: Payer: Self-pay | Admitting: Nurse Practitioner

## 2018-11-16 ENCOUNTER — Other Ambulatory Visit: Payer: Self-pay | Admitting: Orthopedic Surgery

## 2018-11-16 NOTE — Telephone Encounter (Signed)
Hydrocodone-Acetaminophen  5/325 mg  Qty 30 Tablets ° °PATIENT USES [facility] °

## 2018-11-17 MED ORDER — HYDROCODONE-ACETAMINOPHEN 5-325 MG PO TABS: ORAL_TABLET | ORAL | 0 refills | Status: DC

## 2018-12-02 DIAGNOSIS — J449 Chronic obstructive pulmonary disease, unspecified: Secondary | ICD-10-CM | POA: Diagnosis not present

## 2018-12-20 ENCOUNTER — Telehealth: Payer: Self-pay | Admitting: Radiology

## 2018-12-20 NOTE — Telephone Encounter (Signed)
I scheduled her for Friday at 9:40

## 2018-12-20 NOTE — Telephone Encounter (Signed)
Patient called, and said that her shoulder is hurting her terribly.  Severe pain.  She is scheduled for an MRI on 01/03/19.  She is asking to come in for an injection?  Please call her.

## 2018-12-20 NOTE — Telephone Encounter (Signed)
We can see her, I will call her after clinic to make appointment

## 2018-12-22 ENCOUNTER — Other Ambulatory Visit: Payer: Self-pay

## 2018-12-22 ENCOUNTER — Ambulatory Visit (INDEPENDENT_AMBULATORY_CARE_PROVIDER_SITE_OTHER): Payer: Medicare Other | Admitting: Orthopedic Surgery

## 2018-12-22 VITALS — BP 100/61 | HR 90 | Temp 98.1°F | Ht 66.0 in | Wt 190.0 lb

## 2018-12-22 DIAGNOSIS — M75121 Complete rotator cuff tear or rupture of right shoulder, not specified as traumatic: Secondary | ICD-10-CM

## 2018-12-22 MED ORDER — HYDROCODONE-ACETAMINOPHEN 5-325 MG PO TABS: 1.0000 | ORAL_TABLET | Freq: Four times a day (QID) | ORAL | 0 refills | Status: DC | PRN

## 2018-12-22 MED ORDER — IBUPROFEN 800 MG PO TABS: 800.0000 mg | ORAL_TABLET | Freq: Three times a day (TID) | ORAL | 0 refills | Status: DC | PRN

## 2018-12-22 MED ORDER — GABAPENTIN 100 MG PO CAPS: 100.0000 mg | ORAL_CAPSULE | Freq: Three times a day (TID) | ORAL | 2 refills | Status: AC

## 2018-12-22 NOTE — Progress Notes (Addendum)
Chief Complaint  Patient presents with  . Shoulder Pain    right    63 year old female increasing pain right shoulder thought to have rotator cuff tear, MRI delayed because COVID-19 reordered.  System review denies tingling or numbness right arm  Exam Physical Exam Vitals signs and nursing note reviewed.  Constitutional:      General: She is in acute distress.     Appearance: Normal appearance. She is not ill-appearing, toxic-appearing or diaphoretic.  Musculoskeletal:     Right shoulder: She exhibits decreased range of motion, tenderness, bony tenderness, crepitus, pain and decreased strength. She exhibits no swelling, no effusion, no deformity, no spasm and normal pulse.       Arms:  Neurological:     General: No focal deficit present.     Mental Status: She is alert and oriented to person, place, and time.  Psychiatric:        Attention and Perception: Attention normal.        Mood and Affect: Affect is tearful.        Speech: Speech normal.        Behavior: Behavior normal. Behavior is cooperative.       Procedure note the subacromial injection shoulder RIGHT    Verbal consent was obtained to inject the  RIGHT   Shoulder  Timeout was completed to confirm the injection site is a subacromial space of the  RIGHT  shoulder   Medication used Depo-Medrol 40 mg and lidocaine 1% 3 cc  Anesthesia was provided by ethyl chloride  The injection was performed in the RIGHT  posterior subacromial space. After pinning the skin with alcohol and anesthetized the skin with ethyl chloride the subacromial space was injected using a 20-gauge needle. There were no complications  Sterile dressing was applied.   Refill norco and add gabapentin and ibuprofen   Meds ordered this encounter  Medications  . HYDROcodone-acetaminophen (NORCO/VICODIN) 5-325 MG tablet    Sig: Take 1 tablet by mouth every 6 (six) hours as needed for up to 7 days for moderate pain.    Dispense:  28 tablet   Refill:  0  . ibuprofen (ADVIL) 800 MG tablet    Sig: Take 1 tablet (800 mg total) by mouth every 8 (eight) hours as needed.    Dispense:  30 tablet    Refill:  0  . gabapentin (NEURONTIN) 100 MG capsule    Sig: Take 1 capsule (100 mg total) by mouth 3 (three) times daily.    Dispense:  90 capsule    Refill:  2    MRI ordered   Encounter Diagnosis  Name Primary?  Marland Kitchen Nontraumatic complete tear of right rotator cuff Yes      Procedure note the subacromial injection shoulder RIGHT    Verbal consent was obtained to inject the  RIGHT   Shoulder  Timeout was completed to confirm the injection site is a subacromial space of the  RIGHT  shoulder   Medication used Depo-Medrol 40 mg and lidocaine 1% 3 cc  Anesthesia was provided by ethyl chloride  The injection was performed in the RIGHT  posterior subacromial space. After pinning the skin with alcohol and anesthetized the skin with ethyl chloride the subacromial space was injected using a 20-gauge needle. There were no complications  Sterile dressing was applied.

## 2018-12-27 ENCOUNTER — Other Ambulatory Visit: Payer: Self-pay | Admitting: Orthopedic Surgery

## 2018-12-27 ENCOUNTER — Ambulatory Visit: Payer: Medicaid Other | Admitting: Gastroenterology

## 2018-12-27 DIAGNOSIS — M75121 Complete rotator cuff tear or rupture of right shoulder, not specified as traumatic: Secondary | ICD-10-CM

## 2018-12-27 MED ORDER — HYDROCODONE-ACETAMINOPHEN 5-325 MG PO TABS: 1.0000 | ORAL_TABLET | Freq: Four times a day (QID) | ORAL | 0 refills | Status: DC | PRN

## 2018-12-27 NOTE — Telephone Encounter (Signed)
Hydrocodone-Acetaminophen  5/325 mg  Qty 28 Tablets  Take 1 tablet by mouth every 6 (six) hours as needed for up to 7 days for moderate pain.  PATIENT USES [facility]

## 2019-01-02 DIAGNOSIS — J449 Chronic obstructive pulmonary disease, unspecified: Secondary | ICD-10-CM | POA: Diagnosis not present

## 2019-01-03 ENCOUNTER — Other Ambulatory Visit: Payer: Self-pay | Admitting: Orthopaedic Surgery

## 2019-01-03 ENCOUNTER — Ambulatory Visit
Admission: RE | Admit: 2019-01-03 | Discharge: 2019-01-03 | Disposition: A | Discharge status: Home / Self Care | Payer: Medicare Other | Source: Ambulatory Visit | Attending: Orthopedic Surgery | Admitting: Orthopedic Surgery

## 2019-01-03 ENCOUNTER — Other Ambulatory Visit: Payer: Self-pay

## 2019-01-03 DIAGNOSIS — M25511 Pain in right shoulder: Secondary | ICD-10-CM | POA: Diagnosis not present

## 2019-01-03 DIAGNOSIS — M75121 Complete rotator cuff tear or rupture of right shoulder, not specified as traumatic: Secondary | ICD-10-CM

## 2019-01-03 DIAGNOSIS — G8929 Other chronic pain: Secondary | ICD-10-CM

## 2019-01-03 NOTE — Telephone Encounter (Signed)
Patient requests refill on Hydrocodone/Acetaminophen 7.5-325  Mgs.  Qty  28  Sig: Take 1 tablet by mouth every 6 (six) hours as needed for up to 7 days for moderate pain.  Patient states she uses [facility]

## 2019-01-04 ENCOUNTER — Telehealth: Payer: Self-pay | Admitting: Orthopedic Surgery

## 2019-01-04 ENCOUNTER — Other Ambulatory Visit: Payer: Self-pay | Admitting: Radiology

## 2019-01-04 MED ORDER — HYDROCODONE-ACETAMINOPHEN 5-325 MG PO TABS: 1.0000 | ORAL_TABLET | Freq: Four times a day (QID) | ORAL | 0 refills | Status: DC | PRN

## 2019-01-04 NOTE — Telephone Encounter (Signed)
Mailbox full/ put in the orders for June 23rd, but likely need to explain the process to her, and answer any questions  Will try again later

## 2019-01-04 NOTE — Telephone Encounter (Signed)
Patient of Dr. Lemmie Evens.

## 2019-01-04 NOTE — Telephone Encounter (Signed)
Large retracted rotator cuff tear with supraspinatus atrophy large acromioclavicular arthritis per  Will need a right shoulder open rotator cuff repair with graft jacket from [employer] and special needs suture anchor kit

## 2019-01-04 NOTE — Telephone Encounter (Signed)
June 23 open right shoulder OPEN  rotator cuff repair right shoulder with graft jacket from [employer] special-needs suture anchor kit  She called back and was advised

## 2019-01-05 ENCOUNTER — Other Ambulatory Visit: Payer: Self-pay | Admitting: Orthopedic Surgery

## 2019-01-10 ENCOUNTER — Other Ambulatory Visit: Payer: Self-pay | Admitting: Orthopedic Surgery

## 2019-01-10 DIAGNOSIS — M75121 Complete rotator cuff tear or rupture of right shoulder, not specified as traumatic: Secondary | ICD-10-CM

## 2019-01-10 MED ORDER — HYDROCODONE-ACETAMINOPHEN 5-325 MG PO TABS: 1.0000 | ORAL_TABLET | Freq: Four times a day (QID) | ORAL | 0 refills | Status: DC | PRN

## 2019-01-10 NOTE — Telephone Encounter (Signed)
Hydrocodone-Acetaminophen  5/325 mg  Qty 28 Tablets  Take 1 tablet by mouth every 6 (six) hours as needed for up to 7 days for moderate pain.  PATIENT USES [facility]

## 2019-01-17 ENCOUNTER — Other Ambulatory Visit: Payer: Self-pay | Admitting: Orthopedic Surgery

## 2019-01-17 DIAGNOSIS — M75121 Complete rotator cuff tear or rupture of right shoulder, not specified as traumatic: Secondary | ICD-10-CM

## 2019-01-17 MED ORDER — HYDROCODONE-ACETAMINOPHEN 5-325 MG PO TABS: 1.0000 | ORAL_TABLET | Freq: Four times a day (QID) | ORAL | 0 refills | Status: DC | PRN

## 2019-01-17 NOTE — Telephone Encounter (Signed)
Patient requests refill on Hydrocodone/Acetaminophen 5-325  Mgs.  Qty  28  Sig: Take 1 tablet by mouth every 6 (six) hours as needed for up to 7 days for moderate pain.  Patient states she uses [facility]

## 2019-01-18 NOTE — Patient Instructions (Signed)
Colleen Lee  01/18/2019     @PREFPERIOPPHARMACY @   Your procedure is scheduled on  01/23/2019 .  Report to [facility] at  930  A.M.  Call this number if you have problems the morning of surgery:  [phone]   Remember:  Do not eat or drink after midnight.                        Take these medicines the morning of surgery with A SIP OF WATER  Gabapentin, hydrocodone(if needed), labetolol, regaln or zofran(if needed), protonix. Use your inhalers before you come.    Do not wear jewelry, make-up or nail polish.  Do not wear lotions, powders, or perfumes, or deodorant.  Do not shave 48 hours prior to surgery.  Men may shave face and neck.  Do not bring valuables to the hospital.  [facility] is not responsible for any belongings or valuables.  Contacts, dentures or bridgework may not be worn into surgery.  Leave your suitcase in the car.  After surgery it may be brought to your room.  For patients admitted to the hospital, discharge time will be determined by your treatment team.  Patients discharged the day of surgery will not be allowed to drive home.   Name and phone number of your driver:   family Special instructions:  None  Please read over the following fact sheets that you were given. Anesthesia Post-op Instructions and Care and Recovery After Surgery       Surgery for Rotator Cuff Tear, Care After Refer to this sheet in the next few weeks. These instructions provide you with information about caring for yourself after your procedure. Your health care provider may also give you more specific instructions. Your treatment has been planned according to current medical practices, but problems sometimes occur. Call your health care provider if you have any problems or questions after your procedure. What can I expect after the procedure? After the procedure, it is common to have:  Swelling.  Pain.  Stiffness.  Tenderness. Follow these instructions at  home: If you have a sling:   Wear the sling as told by your health care provider. Remove it only as told by your health care provider.  Loosen the sling if your fingers tingle, become numb, or turn cold and blue.  Do not let your sling get wet if it is not waterproof.  Keep the sling clean. Bathing  Do not take baths, swim, or use a hot tub until your health care provider approves. Ask your health care provider if you can take showers. You may only be allowed to take sponge baths for bathing.  Keep your bandage (dressing) dry until your health care provider says it can be removed. Incision care  Follow instructions from your health care provider about how to take care of your incision. Make sure you: ? Wash your hands with soap and water before you change your dressing. If soap and water are not available, use hand sanitizer. ? Change your dressing as told by your health care provider. ? Leave stitches (sutures), skin glue, or adhesive strips in place. These skin closures may need to stay in place for 2 weeks or longer. If adhesive strip edges start to loosen and curl up, you may trim the loose edges. Do not remove adhesive strips completely unless your health care provider tells you to do that.  Check your incision area  every day for signs of infection. Check for: ? More redness, swelling, or pain. ? More fluid or blood. ? Warmth. ? Pus or a bad smell. Managing pain, stiffness, and swelling   If directed, put ice on your shoulder area. ? Put ice in a plastic bag. ? Place a towel between your skin and the bag. ? Leave the ice on for 20 minutes, 2-3 times a day.  Move your fingers often to avoid stiffness and to lessen swelling.  Raise (elevate) your upper body on pillows when you lie down and when you sleep. ? Do not sleep on the front of your body (abdomen). ? Do not sleep on the side that your surgery was performed on. Driving  Do not drive for 24 hours if you received a  medicine to help you relax (sedative) during your procedure.  Do not drive or operate heavy machinery while taking prescription pain medicine.  Ask your health care provider when it is safe for you to drive. Activity  Do not use your arm to support your body weight until your health care provider approves.  Do not lift or hold anything with your arm until your health care provider approves.  Return to your normal activities as told by your health care provider. Ask your health care provider what activities are safe for you.  Do exercises as told by your health care provider. General instructions   Do not use any tobacco products, such as cigarettes, chewing tobacco, or e-cigarettes. Tobacco can delay healing. If you need help quitting, ask your health care provider.  Take over-the-counter and prescription medicines only as told by your health care provider.  If you were prescribed an antibiotic medicine, take it as told by your health care provider. Do not stop taking the antibiotic even if you start to feel better.  Keep all follow-up visits as told by your health care provider. This is important. Contact a health care provider if:  You have a fever.  You have more redness, swelling, or pain around your incision.  You have more fluid or blood coming from your incision.  Your incision feels warm to the touch.  You have pus or a bad smell coming from your incision.  You have pain that gets worse or does not get better with medicine. Get help right away if:  You have severe pain.  You lose feeling in your arm or hand.  Your hand or fingers turn very pale or blue. This information is not intended to replace advice given to you by your health care provider. Make sure you discuss any questions you have with your health care provider. Document Released: 07/19/2005 Document Revised: 03/17/2018 Document Reviewed: 08/02/2015 Elsevier Interactive Patient Education  2019 [place] Anesthesia, Adult, Care After This sheet gives you information about how to care for yourself after your procedure. Your health care provider may also give you more specific instructions. If you have problems or questions, contact your health care provider. What can I expect after the procedure? After the procedure, the following side effects are common:  Pain or discomfort at the IV site.  Nausea.  Vomiting.  Sore throat.  Trouble concentrating.  Feeling cold or chills.  Weak or tired.  Sleepiness and fatigue.  Soreness and body aches. These side effects can affect parts of the body that were not involved in surgery. Follow these instructions at home:  For at least 24 hours after the procedure:  Have a responsible  adult stay with you. It is important to have someone help care for you until you are awake and alert.  Rest as needed.  Do not: ? Participate in activities in which you could fall or become injured. ? Drive. ? Use heavy machinery. ? Drink alcohol. ? Take sleeping pills or medicines that cause drowsiness. ? Make important decisions or sign legal documents. ? Take care of children on your own. Eating and drinking  Follow any instructions from your health care provider about eating or drinking restrictions.  When you feel hungry, start by eating small amounts of foods that are soft and easy to digest (bland), such as toast. Gradually return to your regular diet.  Drink enough fluid to keep your urine pale yellow.  If you vomit, rehydrate by drinking water, juice, or clear broth. General instructions  If you have sleep apnea, surgery and certain medicines can increase your risk for breathing problems. Follow instructions from your health care provider about wearing your sleep device: ? Anytime you are sleeping, including during daytime naps. ? While taking prescription pain medicines, sleeping medicines, or medicines that make you drowsy.   Return to your normal activities as told by your health care provider. Ask your health care provider what activities are safe for you.  Take over-the-counter and prescription medicines only as told by your health care provider.  If you smoke, do not smoke without supervision.  Keep all follow-up visits as told by your health care provider. This is important. Contact a health care provider if:  You have nausea or vomiting that does not get better with medicine.  You cannot eat or drink without vomiting.  You have pain that does not get better with medicine.  You are unable to pass urine.  You develop a skin rash.  You have a fever.  You have redness around your IV site that gets worse. Get help right away if:  You have difficulty breathing.  You have chest pain.  You have blood in your urine or stool, or you vomit blood. Summary  After the procedure, it is common to have a sore throat or nausea. It is also common to feel tired.  Have a responsible adult stay with you for the first 24 hours after general anesthesia. It is important to have someone help care for you until you are awake and alert.  When you feel hungry, start by eating small amounts of foods that are soft and easy to digest (bland), such as toast. Gradually return to your regular diet.  Drink enough fluid to keep your urine pale yellow.  Return to your normal activities as told by your health care provider. Ask your health care provider what activities are safe for you. This information is not intended to replace advice given to you by your health care provider. Make sure you discuss any questions you have with your health care provider. Document Released: 10/25/2000 Document Revised: 03/04/2017 Document Reviewed: 03/04/2017 Elsevier Interactive Patient Education  2019 [place]. How to Use Chlorhexidine Before Surgery Chlorhexidine gluconate (CHG) is a germ-killing (antiseptic) solution that is used to  clean the skin. It gets rid of the bacteria that normally live on the skin. Cleaning your skin with CHG before surgery helps lower the risk for infection after surgery. To clean your skin before surgery, you may be given:  A CHG solution to use in the shower.  A prepackaged cloth that contains CHG. What are the risks? Risks of using CHG include:  A skin reaction.  Hearing loss, if CHG gets in your ears.  Eye injury, if CHG gets in your eyes and is not rinsed out.  The CHG product catching fire. Make sure that you avoid smoking and flames after applying CHG to your skin. Do not use CHG:  If you have a chlorhexidine allergy or have previously reacted to chlorhexidine.  On babies younger than 43 months of age. How to use CHG solution   Use CHG only as told by your health care provider, and follow the instructions on the label.  Use CHG solution while taking a shower. Follow these steps when using CHG solution (unless your health care provider gives you different instructions): 1. Start the shower. 2. Use your normal soap and shampoo to wash your face and hair. 3. Turn off the shower or move out of the shower stream. 4. Pour the CHG onto a clean washcloth. Do not use any type of brush or rough-edged sponge. 5. Starting at your neck, lather your body down to your toes. Make sure you:  Pay special attention to the part of your body where you will be having surgery. Scrub this area for at least 1 minute.  Use the full amount of CHG as directed. Usually, this is one bottle.  Do not use CHG on your head or face. If the solution gets into your ears or eyes, rinse them well with water.  Avoid your genital area.  Avoid any areas of skin that have broken skin, cuts, or scrapes.  Scrub your back and under your arms. Make sure to wash skin folds. 6. Let the lather sit on your skin for 1-2 minutes or as long as told by your health care provider. 7. Thoroughly rinse your entire body in the  shower. Make sure that all body creases and crevices are rinsed well. 8. Dry off with a clean towel. Do not put any substances on your body afterward, such as powder, lotion, or perfume. 9. Put on clean clothes or pajamas. 10. If it is the night before your surgery, sleep in clean sheets. How to use CHG prepackaged cloths   Only use CHG cloths as told by your health care provider, and follow the instructions on the label.  Use the CHG cloth on clean, dry skin. Follow these steps when using a CHG cloth (unless your health care provider gives you different instructions): 1. Using the CHG cloth, vigorously scrub the part of your body where you will be having surgery. Scrub using a back-and-forth motion for 3 minutes. The area on your body should be completely wet with CHG when you are done scrubbing. 2. Do not rinse. Discard the cloth and let the area air-dry for 1 minute. Do not put any substances on your body afterward, such as powder, lotion, or perfume. 3. Put on clean clothes or pajamas. 4. If it is the night before your surgery, sleep in clean sheets. Contact a health care provider if:  Your skin gets irritated after scrubbing.  You have questions about using your solution or cloth. Get help right away if:  Your eyes become very red or swollen.  Your eyes itch badly.  Your skin itches badly and is red or swollen.  Your hearing changes.  You have trouble seeing.  You have swelling or tingling in your mouth or throat.  You have trouble breathing.  You swallow any chlorhexidine. Summary  Chlorhexidine gluconate (CHG) is a germ-killing (antiseptic) solution that is used to clean  the skin. Cleaning your skin with CHG before surgery helps lower the risk for infection after surgery.  You may be given CHG to use at home. It may be in a bottle or in a prepackaged cloth to use on your skin. Carefully follow your health care provider's instructions and the instructions on the product  label.  Do not use CHG if you have a chlorhexidine allergy.  Contact your health care provider if your skin gets irritated after scrubbing. This information is not intended to replace advice given to you by your health care provider. Make sure you discuss any questions you have with your health care provider. Document Released: 04/12/2012 Document Revised: 06/16/2017 Document Reviewed: 06/16/2017 Elsevier Interactive Patient Education  2019 [employer].

## 2019-01-19 ENCOUNTER — Encounter (HOSPITAL_COMMUNITY): Payer: Self-pay

## 2019-01-19 ENCOUNTER — Other Ambulatory Visit: Payer: Self-pay

## 2019-01-19 ENCOUNTER — Other Ambulatory Visit (HOSPITAL_COMMUNITY)
Admission: RE | Admit: 2019-01-19 | Discharge: 2019-01-19 | Disposition: A | Discharge status: Home / Self Care | Payer: Medicare Other | Source: Ambulatory Visit | Attending: Orthopedic Surgery | Admitting: Orthopedic Surgery

## 2019-01-19 ENCOUNTER — Encounter (HOSPITAL_COMMUNITY)
Admission: RE | Admit: 2019-01-19 | Discharge: 2019-01-19 | Disposition: A | Discharge status: Home / Self Care | Payer: Medicare Other | Source: Ambulatory Visit | Attending: Orthopedic Surgery | Admitting: Orthopedic Surgery

## 2019-01-19 DIAGNOSIS — Z01812 Encounter for preprocedural laboratory examination: Secondary | ICD-10-CM | POA: Diagnosis not present

## 2019-01-19 DIAGNOSIS — Z1159 Encounter for screening for other viral diseases: Secondary | ICD-10-CM | POA: Insufficient documentation

## 2019-01-19 LAB — COMPREHENSIVE METABOLIC PANEL
ALT: 32 U/L (ref 0–44)
AST: 29 U/L (ref 15–41)
Albumin: 4.2 g/dL (ref 3.5–5.0)
Alkaline Phosphatase: 45 U/L (ref 38–126)
Anion gap: 11 (ref 5–15)
BUN: 14 mg/dL (ref 8–23)
CO2: 17 mmol/L — ABNORMAL LOW (ref 22–32)
Calcium: 10.3 mg/dL (ref 8.9–10.3)
Chloride: 109 mmol/L (ref 98–111)
Creatinine, Ser: 0.98 mg/dL (ref 0.44–1.00)
GFR calc Af Amer: >60 mL/min (ref >60)
GFR calc non Af Amer: >60 mL/min (ref >60)
Glucose, Bld: 72 mg/dL (ref 70–99)
Potassium: 4 mmol/L (ref 3.5–5.1)
Sodium: 137 mmol/L (ref 135–145)
Total Bilirubin: 0.8 mg/dL (ref 0.3–1.2)
Total Protein: 8.2 g/dL — ABNORMAL HIGH (ref 6.5–8.1)

## 2019-01-19 LAB — CBC WITH DIFFERENTIAL/PLATELET
Abs Immature Granulocytes: 0.02 10*3/uL (ref 0.00–0.07)
Basophils Absolute: 0 10*3/uL (ref 0.0–0.1)
Basophils Relative: 1 %
Eosinophils Absolute: 0.2 10*3/uL (ref 0.0–0.5)
Eosinophils Relative: 2 %
HCT: 40.2 % (ref 36.0–46.0)
Hemoglobin: 12.9 g/dL (ref 12.0–15.0)
Immature Granulocytes: 0 %
Lymphocytes Relative: 22 %
Lymphs Abs: 1.6 10*3/uL (ref 0.7–4.0)
MCH: 31.9 pg (ref 26.0–34.0)
MCHC: 32.1 g/dL (ref 30.0–36.0)
MCV: 99.3 fL (ref 80.0–100.0)
Monocytes Absolute: 0.7 10*3/uL (ref 0.1–1.0)
Monocytes Relative: 10 %
Neutro Abs: 4.5 10*3/uL (ref 1.7–7.7)
Neutrophils Relative %: 65 %
Platelets: 199 10*3/uL (ref 150–400)
RBC: 4.05 MIL/uL (ref 3.87–5.11)
RDW: 14.4 % (ref 11.5–15.5)
WBC: 6.9 10*3/uL (ref 4.0–10.5)
nRBC: 0 % (ref 0.0–0.2)

## 2019-01-19 LAB — PROTIME-INR
INR: 1 (ref 0.8–1.2)
Prothrombin Time: 13.5 seconds (ref 11.4–15.2)

## 2019-01-20 LAB — NOVEL CORONAVIRUS, NAA (HOSP ORDER, SEND-OUT TO REF LAB; TAT 18-24 HRS): SARS-CoV-2, NAA: NOT DETECTED

## 2019-01-22 NOTE — H&P (Signed)
Preop history and physical  Chronic pain right shoulder  63 year old female has had right shoulder pain for several years several times she was advised to have surgery but her husband was sick and she could not schedule the surgery.  She presented about 4 weeks ago with an acute onset of intense increasing pain in the right shoulder and was finally sent for MRI and it shows she has a rotator cuff tear fairly severe.  She has had multiple cortisone injections oral anti-inflammatories and pain medication as well as therapy which did not improve her shoulder function or decrease her pain and she is now here for surgery   Review of Systems  Constitutional: Negative for chills and fever.  Respiratory: Positive for shortness of breath.   Musculoskeletal: Positive for back pain, joint pain and neck pain.  Skin: Negative.   Neurological: Positive for tingling, sensory change and weakness.  All other systems reviewed and are negative.    Past Medical History:  Diagnosis Date  . Asthma   . Chronic abdominal pain   . Chronic back pain   . Chronic constipation   . Cirrhosis ([place])    Metavir score F4 on elastography 2015  . Cyclical vomiting   . Fibroids   . Fibromyalgia   . GERD (gastroesophageal reflux disease)   . H. pylori infection 2014   treated with pylera, had to be treated again as it was not eradicated. Urea breath test then negative after subsequent treatment.   . [name]    HCV RNA positive 09/2012  . Hypertension   . Marijuana use   . Nausea and vomiting    chronic, recurrent  . PONV (postoperative nausea and vomiting)    pt thinks maybe once she had N&V  . Sciatica of left side     Past Surgical History:  Procedure Laterality Date  . BALLOON DILATION  12/20/2017   Procedure: BALLOON DILATION;  Surgeon: Danie Binder, MD;  Location: [facility];  Service: Endoscopy;;  pyloric dilation  . BIOPSY  12/20/2017   Procedure: BIOPSY;  Surgeon: Danie Binder, MD;   Location: [facility];  Service: Endoscopy;;  duodenal and gastric biopsy  . COLONOSCOPY  01/18/2008   BPZ:WCHENI rectum.  Long redundant colon, a diminutive sigmoid polyp status post cold biopsy removed. Hyperplastic polyp. Repeat colonoscopy June 2014 due to family history of colon cancer  . COLONOSCOPY WITH ESOPHAGOGASTRODUODENOSCOPY (EGD) N/A 11/02/2012   DPO:EUMPNTIR gastric mucosa of doubtful, +H.pylori. Incomplete colonoscopy due to patient unable to tolerate exam, proximal colon seen. Patient refused ACBE.  Marland Kitchen COLONOSCOPY WITH PROPOFOL N/A 03/08/2013   Dr. Gala Romney: colonic polyp-removed as scribed above. Internal Hemorrhoids. Pathology did not reveal any colonic tissue, only mucus. SURVEILLANCE DUE Aug 2019  . COLONOSCOPY WITH PROPOFOL N/A 01/19/2018   Dr. Gala Romney: 6 mm cecal inflammatoy polyp, otherwise normal., Surveilance in 5 year s  . ESOPHAGEAL DILATION  12/20/2017   Procedure: ESOPHAGEAL DILATION;  Surgeon: Danie Binder, MD;  [facility]: [facility];  Service: Endoscopy;;  . ESOPHAGOGASTRODUODENOSCOPY  01/18/2008   RMR: Normal esophagus, normal  stomach  . ESOPHAGOGASTRODUODENOSCOPY (EGD) WITH PROPOFOL N/A 02/09/2016   Normal esophagus, small hiatal hernia, portal hypertensive gastropathy, normal second portion of duodenum. Due in July 2019  . ESOPHAGOGASTRODUODENOSCOPY (EGD) WITH PROPOFOL N/A 12/20/2017   Dr. Oneida Alar: benign appearing esophageal stenosis s/p dilation, mild pyloric stenosis s/p biopsy and dilation, mild duodenitis.   Marland Kitchen HYSTEROSCOPY  05/11/2016   Procedure: HYSTEROSCOPY;  Surgeon:  Jonnie Kind, MD;  Location: [facility];  Service: Gynecology;;  . KNEE SURGERY     left knee  . MULTIPLE EXTRACTIONS WITH ALVEOLOPLASTY N/A 10/15/2013   Procedure: MULTIPLE EXTRACTION WITH ALVEOLOPLASTY;  Surgeon: Gae Bon, DDS;  Location: [place];  Service: Oral Surgery;  Laterality: N/A;  . POLYPECTOMY N/A 03/08/2013   Procedure: POLYPECTOMY;  Surgeon: Daneil Dolin, MD;  Location: [facility];   Service: Endoscopy;  Laterality: N/A;  . POLYPECTOMY N/A 05/11/2016   Procedure: REMOVAL OF ENDOMETRIAL POLYP;  Surgeon: Jonnie Kind, MD;  Location: [facility];  Service: Gynecology;  Laterality: N/A;  . TOTAL KNEE ARTHROPLASTY Left 02/05/2015   Procedure: LEFT TOTAL KNEE ARTHROPLASTY;  Surgeon: Carole Civil, MD;  Location: [facility];  Service: Orthopedics;  Laterality: Left;    Family History  Problem Relation Age of Onset  . Colon cancer Brother 79          . Multiple myeloma Brother   . Liver cancer Sister   . Prostate cancer Brother   . Pancreatic cancer Brother   . Cancer Mother        breast  . Asthma Mother    Social History   Tobacco Use  . Smoking status: Current Every Day Smoker    Packs/day: 1.00    Years: 40.00    Pack years: 40.00    Types: Cigarettes  . Smokeless tobacco: Never Used  . Tobacco comment: Smokes one pack of cigarettes daily  Substance Use Topics  . Alcohol use: Yes    Alcohol/week: 1.0 standard drinks    Types: 1 Cans of beer per week    Comment: occas  . Drug use: Not Currently    Types: Marijuana    Comment: history of marijuana in past- almost 2 years ago    Allergies  Allergen Reactions  . Penicillins Rash    Did it involve swelling of the face/tongue/throat, SOB, or low BP? No Did it involve sudden or severe rash/hives, skin peeling, or any reaction on the inside of your mouth or nose? No Did you need to seek medical attention at a hospital or doctor's office? No When did it last happen?Many years ago If all above answers are "NO", may proceed with cephalosporin use.      Current Meds  Medication Sig  . albuterol (PROVENTIL HFA;VENTOLIN HFA) 108 (90 Base) MCG/ACT inhaler Inhale 1-2 puffs into the lungs every 6 (six) hours as needed for wheezing or shortness of breath.   . budesonide-formoterol (SYMBICORT) 160-4.5 MCG/ACT inhaler Inhale 2 puffs into the lungs daily.   . Cyanocobalamin (VITAMIN B 12 PO) Take 1 tablet by  mouth daily.  Marland Kitchen gabapentin (NEURONTIN) 100 MG capsule Take 1 capsule (100 mg total) by mouth 3 (three) times daily. (Patient taking differently: Take 100 mg by mouth daily. )  . hydrALAZINE (APRESOLINE) 50 MG tablet Take 1 tablet (50 mg total) by mouth every 8 (eight) hours.  . hydrochlorothiazide (HYDRODIURIL) 12.5 MG tablet Take 12.5 mg by mouth daily.   Marland Kitchen ibuprofen (ADVIL) 800 MG tablet Take 1 tablet (800 mg total) by mouth every 8 (eight) hours as needed. (Patient taking differently: Take 800 mg by mouth daily. )  . labetalol (NORMODYNE) 100 MG tablet Take 2 tablets (200 mg total) by mouth 2 (two) times daily.  Marland Kitchen losartan (COZAAR) 100 MG tablet Take 1 tablet (100 mg total) by mouth daily.  . megestrol (MEGACE) 40 MG tablet Take 40 mg by mouth daily.  Marland Kitchen  metoCLOPramide (REGLAN) 10 MG tablet Take 10 mg by mouth daily before breakfast.   . ondansetron (ZOFRAN ODT) 4 MG disintegrating tablet Take 1 tablet (4 mg total) by mouth 3 (three) times daily. '4mg'$  ODT q4 hours prn nausea/vomit (Patient taking differently: Take 4 mg by mouth every 8 (eight) hours as needed for nausea or vomiting. )  . pantoprazole (PROTONIX) 40 MG tablet Take 1 tablet (40 mg total) by mouth 2 (two) times daily before a meal.  . TRULANCE 3 MG TABS TAKE 1 TABLET BY MOUTH ONCE A DAY. (Patient taking differently: Take 3 mg by mouth daily. )  . [DISCONTINUED] HYDROcodone-acetaminophen (NORCO/VICODIN) 5-325 MG tablet Take 1 tablet by mouth every 6 (six) hours as needed for up to 7 days for moderate pain.    There were no vitals taken for this visit.  Physical Exam The patient's body habitus is normal she has no developmental abnormalities.  Her nutritional status is good she has no gross deformities she is well-groomed  Peripheral vascular system shows no swelling or varicose veins she has excellent pulses and normal temperature in his extremities extremities show no edema or tenderness  Her lymph nodes in the cervical spine and  axilla of both shoulders remain normal Ortho Exam  Right shoulder skin is normal for surgery she has tenderness globally around the shoulder especially in the anterolateral deltoid and lateral deltoid.  She has decreased range of motion in all planes weakness in abduction as well as flexion with no external rotation or internal rotation contracture her shoulder is stable  Her other extremities show no clubbing cyanosis or edema have no major joint contracture subluxation atrophy tremor or malalignment  Her finger-nose test on the left is normal for her left upper extremity for right-hand-dominant patient she has no pathologic reflexes and 2+ reflexes in each upper extremity sensation remains normal  She is oriented x3 mood and affect are normal  MEDICAL DECISION SECTION  Imaging: CLINICAL DATA:  Chronic right shoulder pain with limited range of motion for 8 months   EXAM: MRI OF THE RIGHT SHOULDER WITHOUT CONTRAST   TECHNIQUE: Multiplanar, multisequence MR imaging of the shoulder was performed. No intravenous contrast was administered.   COMPARISON:  None.   FINDINGS: Rotator cuff: Complete tear of the supraspinatus tendon with 2.8 cm of retraction. Large full-thickness tear of the anterior half of the infraspinatus tendon. Teres minor tendon is intact. Subscapularis tendon is intact.   Muscles:  Mild atrophy of the supraspinatus muscle.   Biceps long head: Mild tendinosis of the intra-articular portion of the long head of biceps tendon.   Acromioclavicular Joint: Moderate arthropathy of the acromioclavicular joint. Type I acromion. Trace subacromial/subdeltoid bursal fluid.   Glenohumeral Joint: No joint effusion.  No chondral defect.   Labrum: Grossly intact, but evaluation is limited by lack of intraarticular fluid.   Bones:  No acute osseous abnormality.  No aggressive osseous lesion.   Other: No fluid collection or hematoma.   IMPRESSION: 1. Complete tear of the  supraspinatus tendon with 2.8 cm of retraction. 2. Large full-thickness tear of the anterior half of the infraspinatus tendon. 3. Mild tendinosis of the intra-articular portion of the long head of biceps tendon.     Electronically Signed   By: Kathreen Devoid   On: 01/03/2019 15:30  Diagnosis right shoulder rotator cuff tear  Plan open right rotator cuff repair with preparation necessary to do a graft and superior capsular reconstruction if necessary  Arther Abbott, MD  01/22/2019 11:54 AM

## 2019-01-23 ENCOUNTER — Encounter (HOSPITAL_COMMUNITY): Payer: Self-pay

## 2019-01-23 ENCOUNTER — Telehealth: Payer: Self-pay | Admitting: Orthopedic Surgery

## 2019-01-23 ENCOUNTER — Ambulatory Visit (HOSPITAL_COMMUNITY)
Admission: RE | Admit: 2019-01-23 | Discharge: 2019-01-23 | Disposition: A | Discharge status: Home / Self Care | Payer: Medicare Other | Attending: Orthopedic Surgery | Admitting: Orthopedic Surgery

## 2019-01-23 ENCOUNTER — Other Ambulatory Visit: Payer: Self-pay | Admitting: Orthopedic Surgery

## 2019-01-23 ENCOUNTER — Other Ambulatory Visit: Payer: Self-pay

## 2019-01-23 ENCOUNTER — Ambulatory Visit (HOSPITAL_COMMUNITY): Payer: Medicare Other | Admitting: Anesthesiology

## 2019-01-23 ENCOUNTER — Encounter (HOSPITAL_COMMUNITY)
Admission: RE | Disposition: A | Discharge status: Home / Self Care | Payer: Self-pay | Source: Home / Self Care | Attending: Orthopedic Surgery

## 2019-01-23 DIAGNOSIS — Z808 Family history of malignant neoplasm of other organs or systems: Secondary | ICD-10-CM | POA: Insufficient documentation

## 2019-01-23 DIAGNOSIS — Z803 Family history of malignant neoplasm of breast: Secondary | ICD-10-CM | POA: Insufficient documentation

## 2019-01-23 DIAGNOSIS — Z825 Family history of asthma and other chronic lower respiratory diseases: Secondary | ICD-10-CM | POA: Insufficient documentation

## 2019-01-23 DIAGNOSIS — K219 Gastro-esophageal reflux disease without esophagitis: Secondary | ICD-10-CM | POA: Insufficient documentation

## 2019-01-23 DIAGNOSIS — Z86010 Personal history of colonic polyps: Secondary | ICD-10-CM | POA: Insufficient documentation

## 2019-01-23 DIAGNOSIS — Z8042 Family history of malignant neoplasm of prostate: Secondary | ICD-10-CM | POA: Diagnosis not present

## 2019-01-23 DIAGNOSIS — I10 Essential (primary) hypertension: Secondary | ICD-10-CM | POA: Diagnosis not present

## 2019-01-23 DIAGNOSIS — F17210 Nicotine dependence, cigarettes, uncomplicated: Secondary | ICD-10-CM | POA: Insufficient documentation

## 2019-01-23 DIAGNOSIS — M12811 Other specific arthropathies, not elsewhere classified, right shoulder: Secondary | ICD-10-CM

## 2019-01-23 DIAGNOSIS — M25511 Pain in right shoulder: Secondary | ICD-10-CM | POA: Insufficient documentation

## 2019-01-23 DIAGNOSIS — M797 Fibromyalgia: Secondary | ICD-10-CM | POA: Insufficient documentation

## 2019-01-23 DIAGNOSIS — Z96652 Presence of left artificial knee joint: Secondary | ICD-10-CM | POA: Diagnosis not present

## 2019-01-23 DIAGNOSIS — J45909 Unspecified asthma, uncomplicated: Secondary | ICD-10-CM | POA: Insufficient documentation

## 2019-01-23 DIAGNOSIS — G8929 Other chronic pain: Secondary | ICD-10-CM | POA: Insufficient documentation

## 2019-01-23 DIAGNOSIS — M5432 Sciatica, left side: Secondary | ICD-10-CM | POA: Diagnosis not present

## 2019-01-23 DIAGNOSIS — Z800 Family history of malignant neoplasm of digestive organs: Secondary | ICD-10-CM | POA: Insufficient documentation

## 2019-01-23 DIAGNOSIS — Z5309 Procedure and treatment not carried out because of other contraindication: Secondary | ICD-10-CM | POA: Insufficient documentation

## 2019-01-23 SURGERY — REPAIR, ROTATOR CUFF, OPEN
Anesthesia: General | Laterality: Right

## 2019-01-23 MED ORDER — LACTATED RINGERS IV SOLN: INTRAVENOUS | Status: DC

## 2019-01-23 MED ORDER — MIDAZOLAM HCL 2 MG/2ML IJ SOLN
INTRAMUSCULAR | Status: AC
  Filled 2019-01-23: qty 2

## 2019-01-23 MED ORDER — MIDAZOLAM HCL 2 MG/2ML IJ SOLN: 0.5000 mg | INTRAMUSCULAR | Status: DC | PRN

## 2019-01-23 MED ORDER — HYDROCODONE-ACETAMINOPHEN 7.5-325 MG PO TABS: 1.0000 | ORAL_TABLET | Freq: Once | ORAL | Status: DC | PRN

## 2019-01-23 MED ORDER — MIDAZOLAM HCL 2 MG/2ML IJ SOLN
2.0000 mg | Freq: Once | INTRAMUSCULAR | Status: AC
  Administered 2019-01-23: 2 mg via INTRAVENOUS
  Filled 2019-01-23: qty 2

## 2019-01-23 MED ORDER — MIDAZOLAM HCL 2 MG/2ML IJ SOLN: 2.0000 mg | Freq: Once | INTRAMUSCULAR | Status: DC

## 2019-01-23 MED ORDER — LIDOCAINE 2% (20 MG/ML) 5 ML SYRINGE
INTRAMUSCULAR | Status: AC
  Filled 2019-01-23: qty 5

## 2019-01-23 MED ORDER — HYDROMORPHONE HCL 1 MG/ML IJ SOLN: 0.2500 mg | INTRAMUSCULAR | Status: DC | PRN

## 2019-01-23 MED ORDER — GLYCOPYRROLATE PF 0.2 MG/ML IJ SOSY
PREFILLED_SYRINGE | INTRAMUSCULAR | Status: AC
  Filled 2019-01-23: qty 1

## 2019-01-23 MED ORDER — PROMETHAZINE HCL 25 MG/ML IJ SOLN: 6.2500 mg | INTRAMUSCULAR | Status: DC | PRN

## 2019-01-23 MED ORDER — BUPIVACAINE-EPINEPHRINE (PF) 0.5% -1:200000 IJ SOLN
INTRAMUSCULAR | Status: AC
  Filled 2019-01-23: qty 60

## 2019-01-23 MED ORDER — LIDOCAINE HCL (PF) 1 % IJ SOLN
INTRAMUSCULAR | Status: AC
  Filled 2019-01-23: qty 2

## 2019-01-23 MED ORDER — HYDROCODONE-ACETAMINOPHEN 10-325 MG PO TABS: 1.0000 | ORAL_TABLET | ORAL | 0 refills | Status: DC | PRN

## 2019-01-23 MED ORDER — CLINDAMYCIN PHOSPHATE 900 MG/50ML IV SOLN
900.0000 mg | INTRAVENOUS | Status: DC
  Filled 2019-01-23: qty 50

## 2019-01-23 MED ORDER — LIDOCAINE HCL 2 % IJ SOLN
0.1000 mL | Freq: Once | INTRAMUSCULAR | Status: DC
  Filled 2019-01-23: qty 10

## 2019-01-23 MED ORDER — MEPERIDINE HCL 50 MG/ML IJ SOLN: 6.2500 mg | INTRAMUSCULAR | Status: DC | PRN

## 2019-01-23 MED ORDER — ROCURONIUM BROMIDE 10 MG/ML (PF) SYRINGE
PREFILLED_SYRINGE | INTRAVENOUS | Status: AC
  Filled 2019-01-23: qty 10

## 2019-01-23 MED ORDER — SUCCINYLCHOLINE CHLORIDE 200 MG/10ML IV SOSY
PREFILLED_SYRINGE | INTRAVENOUS | Status: AC
  Filled 2019-01-23: qty 10

## 2019-01-23 MED ORDER — PROPOFOL 10 MG/ML IV BOLUS
INTRAVENOUS | Status: AC
  Filled 2019-01-23: qty 20

## 2019-01-23 MED ORDER — FENTANYL CITRATE (PF) 250 MCG/5ML IJ SOLN
INTRAMUSCULAR | Status: AC
  Filled 2019-01-23: qty 5

## 2019-01-23 MED ORDER — CHLORHEXIDINE GLUCONATE 4 % EX LIQD: 60.0000 mL | Freq: Once | CUTANEOUS | Status: DC

## 2019-01-23 NOTE — Progress Notes (Signed)
Surgery cancelled due to lac of iv access  picc line to be palced next tues am   Rx to carry patient thru to next week in severe pain

## 2019-01-23 NOTE — H&P (View-Only) (Signed)
Surgery cancelled due to lac of iv access  picc line to be palced next tues am   Rx to carry patient thru to next week in severe pain

## 2019-01-23 NOTE — Progress Notes (Addendum)
Several attempts made for peripheral IV  insertion by Craige Cotta ; Ivy Lynn; Candy Sledge RN; Abbie Sons, RN and Antonietta Breach, RN. Ultrasound used with failed ability for catheter to thread all the way into the vain. IVs were attempted with different catheter sizes in left arm and Anesthesiologist made aware and is attempting an IJ at this time. Patient is comfortable and not in any distress at this time. She was given lidocaine to help numb areas and versed to help relax and stay comfortable throughout the process.  Dr. Margaretann Loveless attempted and not able to insert IJ and suggested the surgeon insert a subclavian in OR or possible get a picc line inserted. Anesthesiologist will be in contact with surgeon.   Ericka Pontiff, RN 01/23/19

## 2019-01-23 NOTE — OR Nursing (Signed)
Patient rescheduled for surgery on 01/30/2019, also picc line scheduled to be done on the morning of surgery prior to surgery .  Instructions given to patient , understands to be NPO after MN on Monday,  Also knows to have covid test on Friday at 1010.  Patient discharged and sent home with husband.

## 2019-01-23 NOTE — Telephone Encounter (Signed)
Voice message received from [facility] [phone], relays patient's surgery was cancelled for today, 01/23/19, due to no IV access; states to be re-scheduled for Tuesday, 01/30/19 and "needs booked". States Patient will have PICC line day of surgery, and is scheduled for covid test 01/26/19. Please call Pamala Hurry or Hoyle Sauer.

## 2019-01-23 NOTE — Anesthesia Preprocedure Evaluation (Addendum)
Anesthesia Evaluation    Airway Mallampati: II       Dental  (+) Missing, Partial Lower, Partial Upper   Pulmonary asthma , Current Smoker,     + decreased breath sounds      Cardiovascular hypertension,  Rhythm:regular     Neuro/Psych  Neuromuscular disease    GI/Hepatic GERD  ,(+) Hepatitis -, C  Endo/Other    Renal/GU ARFRenal disease     Musculoskeletal   Abdominal   Peds  Hematology   Anesthesia Other Findings Hep C s/p two rounds tx,  Alcoholic cirrhosis H/O thrombocytopenia Admits to heavy beer drinking recently, denies any illicit drugs for 3 months complicated hx, and pt poor/reluctant historian  Reproductive/Obstetrics                            Anesthesia Physical Anesthesia Plan  ASA: III  Anesthesia Plan: General   Post-op Pain Management:    Induction:   PONV Risk Score and Plan:   Airway Management Planned:   Additional Equipment:   Intra-op Plan:   Post-operative Plan:   Informed Consent: I have reviewed the patients History and Physical, chart, labs and discussed the procedure including the risks, benefits and alternatives for the proposed anesthesia with the patient or authorized representative who has indicated his/her understanding and acceptance.       Plan Discussed with: Anesthesiologist  Anesthesia Plan Comments:         Anesthesia Quick Evaluation

## 2019-01-24 ENCOUNTER — Other Ambulatory Visit: Payer: Self-pay | Admitting: Orthopedic Surgery

## 2019-01-24 NOTE — Telephone Encounter (Signed)
I put in new orders, since the others were cancelled, Hoyle Sauer will take care of this.

## 2019-01-24 NOTE — Addendum Note (Signed)
Addendum  created 01/24/19 0749 by Mickel Baas, CRNA   Charge Capture section accepted

## 2019-01-26 ENCOUNTER — Other Ambulatory Visit (HOSPITAL_COMMUNITY)
Admit: 2019-01-26 | Discharge: 2019-01-26 | Disposition: A | Discharge status: Home / Self Care | Payer: Medicare Other | Source: Ambulatory Visit | Attending: Orthopedic Surgery | Admitting: Orthopedic Surgery

## 2019-01-26 ENCOUNTER — Other Ambulatory Visit: Payer: Self-pay

## 2019-01-26 DIAGNOSIS — Z1159 Encounter for screening for other viral diseases: Secondary | ICD-10-CM | POA: Diagnosis not present

## 2019-01-27 LAB — NOVEL CORONAVIRUS, NAA (HOSP ORDER, SEND-OUT TO REF LAB; TAT 18-24 HRS): SARS-CoV-2, NAA: NOT DETECTED

## 2019-01-29 NOTE — H&P (Signed)
Preop history and physical  Addendum.  The patient was here last week IV cannot be started.  IV team was called for a PICC line and it was felt that the best thing to do was to have her come in this week to have the PICC line and then have surgery so she is back for her right shoulder surgery   Chronic pain right shoulder   63 year old female has had right shoulder pain for several years several times she was advised to have surgery but her husband was sick and she could not schedule the surgery.  She presented about 4 weeks ago with an acute onset of intense increasing pain in the right shoulder and was finally sent for MRI and it shows she has a rotator cuff tear fairly severe.  She has had multiple cortisone injections oral anti-inflammatories and pain medication as well as therapy which did not improve her shoulder function or decrease her pain and she is now here for surgery     Review of Systems  Constitutional: Negative for chills and fever.  Respiratory: Positive for shortness of breath.   Musculoskeletal: Positive for back pain, joint pain and neck pain.  Skin: Negative.   Neurological: Positive for tingling, sensory change and weakness.  All other systems reviewed and are negative.           Past Medical History:  Diagnosis Date  . Asthma    . Chronic abdominal pain    . Chronic back pain    . Chronic constipation    . Cirrhosis ([place])      Metavir score F4 on elastography 2015  . Cyclical vomiting    . Fibroids    . Fibromyalgia    . GERD (gastroesophageal reflux disease)    . H. pylori infection 2014    treated with pylera, had to be treated again as it was not eradicated. Urea breath test then negative after subsequent treatment.   . [name]      HCV RNA positive 09/2012  . Hypertension    . Marijuana use    . Nausea and vomiting      chronic, recurrent  . PONV (postoperative nausea and vomiting)      pt thinks maybe once she had N&V  . Sciatica of left side             Past Surgical History:  Procedure Laterality Date  . BALLOON DILATION   12/20/2017    Procedure: BALLOON DILATION;  Surgeon: Danie Binder, MD;  [facility];  Service: Endoscopy;;  pyloric dilation  . BIOPSY   12/20/2017    Procedure: BIOPSY;  Surgeon: Danie Binder, MD;  [facility];  Service: Endoscopy;;  duodenal and gastric biopsy  . COLONOSCOPY   01/18/2008    RCV:ELFYBO rectum.  Long redundant colon, a diminutive sigmoid polyp status post cold biopsy removed. Hyperplastic polyp. Repeat colonoscopy June 2014 due to family history of colon cancer  . COLONOSCOPY WITH ESOPHAGOGASTRODUODENOSCOPY (EGD) N/A 11/02/2012    FBP:ZWCHENID gastric mucosa of doubtful, +H.pylori. Incomplete colonoscopy due to patient unable to tolerate exam, proximal colon seen. Patient refused ACBE.  Marland Kitchen COLONOSCOPY WITH PROPOFOL N/A 03/08/2013    Dr. Gala Romney: colonic polyp-removed as scribed above. Internal Hemorrhoids. Pathology did not reveal any colonic tissue, only mucus. SURVEILLANCE DUE Aug 2019  . COLONOSCOPY WITH PROPOFOL N/A 01/19/2018    Dr. Gala Romney: 6 mm cecal inflammatoy polyp, otherwise normal., Surveilance in 5 year s  .  ESOPHAGEAL DILATION   12/20/2017    Procedure: ESOPHAGEAL DILATION;  Surgeon: Danie Binder, MD;  [facility];  Service: Endoscopy;;  . ESOPHAGOGASTRODUODENOSCOPY   01/18/2008    RMR: Normal esophagus, normal  stomach  . ESOPHAGOGASTRODUODENOSCOPY (EGD) WITH PROPOFOL N/A 02/09/2016    Normal esophagus, small hiatal hernia, portal hypertensive gastropathy, normal second portion of duodenum. Due in July 2019  . ESOPHAGOGASTRODUODENOSCOPY (EGD) WITH PROPOFOL N/A 12/20/2017    Dr. Oneida Alar: benign appearing esophageal stenosis s/p dilation, mild pyloric stenosis s/p biopsy and dilation, mild duodenitis.   Marland Kitchen HYSTEROSCOPY   05/11/2016    Procedure: HYSTEROSCOPY;  Surgeon: Jonnie Kind, MD;  Location: [facility];  Service: Gynecology;;  . KNEE SURGERY         left knee  . MULTIPLE EXTRACTIONS WITH ALVEOLOPLASTY N/A 10/15/2013    Procedure: MULTIPLE EXTRACTION WITH ALVEOLOPLASTY;  Surgeon: Gae Bon, DDS;  Location: [facility];  Service: Oral Surgery;  Laterality: N/A;  . POLYPECTOMY N/A 03/08/2013    Procedure: POLYPECTOMY;  Surgeon: Daneil Dolin, MD;  Location: [facility];  Service: Endoscopy;  Laterality: N/A;  . POLYPECTOMY N/A 05/11/2016    Procedure: REMOVAL OF ENDOMETRIAL POLYP;  Surgeon: Jonnie Kind, MD;  Location: [facility];  Service: Gynecology;  Laterality: N/A;  . TOTAL KNEE ARTHROPLASTY Left 02/05/2015    Procedure: LEFT TOTAL KNEE ARTHROPLASTY;  Surgeon: Carole Civil, MD;  Location: [facility];  Service: Orthopedics;  Laterality: Left;          Family History  Problem Relation Age of Onset  . Colon cancer Brother 85           . Multiple myeloma Brother    . Liver cancer Sister    . Prostate cancer Brother    . Pancreatic cancer Brother    . Cancer Mother          breast  . Asthma Mother     Social History         Tobacco Use  . Smoking status: Current Every Day Smoker      Packs/day: 1.00      Years: 40.00      Pack years: 40.00      Types: Cigarettes  . Smokeless tobacco: Never Used  . Tobacco comment: Smokes one pack of cigarettes daily  Substance Use Topics  . Alcohol use: Yes      Alcohol/week: 1.0 standard drinks      Types: 1 Cans of beer per week      Comment: occas  . Drug use: Not Currently      Types: Marijuana      Comment: history of marijuana in past- almost 2 years ago          Allergies  Allergen Reactions  . Penicillins Rash      Did it involve swelling of the face/tongue/throat, SOB, or low BP? No Did it involve sudden or severe rash/hives, skin peeling, or any reaction on the inside of your mouth or nose? No Did you need to seek medical attention at a hospital or doctor's office? No When did it last happen? Many years ago      If all above answers are "NO", may proceed with cephalosporin  use.         Active Medications      Current Meds  Medication Sig  . albuterol (PROVENTIL HFA;VENTOLIN HFA) 108 (90 Base) MCG/ACT inhaler Inhale 1-2 puffs into the lungs every 6 (six)  hours as needed for wheezing or shortness of breath.   . budesonide-formoterol (SYMBICORT) 160-4.5 MCG/ACT inhaler Inhale 2 puffs into the lungs daily.   . Cyanocobalamin (VITAMIN B 12 PO) Take 1 tablet by mouth daily.  Marland Kitchen gabapentin (NEURONTIN) 100 MG capsule Take 1 capsule (100 mg total) by mouth 3 (three) times daily. (Patient taking differently: Take 100 mg by mouth daily. )  . hydrALAZINE (APRESOLINE) 50 MG tablet Take 1 tablet (50 mg total) by mouth every 8 (eight) hours.  . hydrochlorothiazide (HYDRODIURIL) 12.5 MG tablet Take 12.5 mg by mouth daily.   Marland Kitchen ibuprofen (ADVIL) 800 MG tablet Take 1 tablet (800 mg total) by mouth every 8 (eight) hours as needed. (Patient taking differently: Take 800 mg by mouth daily. )  . labetalol (NORMODYNE) 100 MG tablet Take 2 tablets (200 mg total) by mouth 2 (two) times daily.  Marland Kitchen losartan (COZAAR) 100 MG tablet Take 1 tablet (100 mg total) by mouth daily.  . megestrol (MEGACE) 40 MG tablet Take 40 mg by mouth daily.  . metoCLOPramide (REGLAN) 10 MG tablet Take 10 mg by mouth daily before breakfast.   . ondansetron (ZOFRAN ODT) 4 MG disintegrating tablet Take 1 tablet (4 mg total) by mouth 3 (three) times daily. '4mg'$  ODT q4 hours prn nausea/vomit (Patient taking differently: Take 4 mg by mouth every 8 (eight) hours as needed for nausea or vomiting. )  . pantoprazole (PROTONIX) 40 MG tablet Take 1 tablet (40 mg total) by mouth 2 (two) times daily before a meal.  . TRULANCE 3 MG TABS TAKE 1 TABLET BY MOUTH ONCE A DAY. (Patient taking differently: Take 3 mg by mouth daily. )  . [DISCONTINUED] HYDROcodone-acetaminophen (NORCO/VICODIN) 5-325 MG tablet Take 1 tablet by mouth every 6 (six) hours as needed for up to 7 days for moderate pain.       There were no vitals taken for  this visit.   Physical Exam The patient's body habitus is normal she has no developmental abnormalities.  Her nutritional status is good she has no gross deformities she is well-groomed   Peripheral vascular system shows no swelling or varicose veins she has excellent pulses and normal temperature in his extremities extremities show no edema or tenderness   Her lymph nodes in the cervical spine and axilla of both shoulders remain normal Ortho Exam   Right shoulder skin is normal for surgery she has tenderness globally around the shoulder especially in the anterolateral deltoid and lateral deltoid.  She has decreased range of motion in all planes weakness in abduction as well as flexion with no external rotation or internal rotation contracture her shoulder is stable   Her other extremities show no clubbing cyanosis or edema have no major joint contracture subluxation atrophy tremor or malalignment   Her finger-nose test on the left is normal for her left upper extremity for right-hand-dominant patient she has no pathologic reflexes and 2+ reflexes in each upper extremity sensation remains normal   She is oriented x3 mood and affect are normal   MEDICAL DECISION SECTION  Imaging: CLINICAL DATA:  Chronic right shoulder pain with limited range of motion for 8 months   EXAM: MRI OF THE RIGHT SHOULDER WITHOUT CONTRAST   TECHNIQUE: Multiplanar, multisequence MR imaging of the shoulder was performed. No intravenous contrast was administered.   COMPARISON:  None.   FINDINGS: Rotator cuff: Complete tear of the supraspinatus tendon with 2.8 cm of retraction. Large full-thickness tear of the anterior half of the  infraspinatus tendon. Teres minor tendon is intact. Subscapularis tendon is intact.   Muscles:  Mild atrophy of the supraspinatus muscle.   Biceps long head: Mild tendinosis of the intra-articular portion of the long head of biceps tendon.   Acromioclavicular Joint: Moderate  arthropathy of the acromioclavicular joint. Type I acromion. Trace subacromial/subdeltoid bursal fluid.   Glenohumeral Joint: No joint effusion.  No chondral defect.   Labrum: Grossly intact, but evaluation is limited by lack of intraarticular fluid.   Bones:  No acute osseous abnormality.  No aggressive osseous lesion.   Other: No fluid collection or hematoma.   IMPRESSION: 1. Complete tear of the supraspinatus tendon with 2.8 cm of retraction. 2. Large full-thickness tear of the anterior half of the infraspinatus tendon. 3. Mild tendinosis of the intra-articular portion of the long head of biceps tendon.     Electronically Signed   By: Kathreen Devoid   On: 01/03/2019 15:30   Diagnosis right shoulder rotator cuff tear   Plan open right rotator cuff repair with preparation necessary to do a graft and superior capsular reconstruction if necessary   Arther Abbott, MD   01/22/2019 11:54 AM

## 2019-01-30 ENCOUNTER — Other Ambulatory Visit: Payer: Self-pay

## 2019-01-30 ENCOUNTER — Encounter (HOSPITAL_COMMUNITY)
Admission: RE | Disposition: A | Discharge status: Home / Self Care | Payer: Self-pay | Source: Home / Self Care | Attending: Orthopedic Surgery

## 2019-01-30 ENCOUNTER — Ambulatory Visit (HOSPITAL_COMMUNITY): Payer: Medicare Other | Admitting: Anesthesiology

## 2019-01-30 ENCOUNTER — Encounter (HOSPITAL_COMMUNITY): Payer: Self-pay | Admitting: Anesthesiology

## 2019-01-30 ENCOUNTER — Ambulatory Visit (HOSPITAL_COMMUNITY)
Admission: RE | Admit: 2019-01-30 | Discharge: 2019-01-30 | Disposition: A | Discharge status: Home / Self Care | Payer: Medicare Other | Attending: Orthopedic Surgery | Admitting: Orthopedic Surgery

## 2019-01-30 ENCOUNTER — Ambulatory Visit: Payer: Self-pay

## 2019-01-30 ENCOUNTER — Ambulatory Visit (HOSPITAL_COMMUNITY)
Admission: RE | Admit: 2019-01-30 | Discharge: 2019-01-30 | Disposition: A | Discharge status: Home / Self Care | Payer: Medicare Other | Source: Ambulatory Visit | Attending: Orthopedic Surgery | Admitting: Orthopedic Surgery

## 2019-01-30 DIAGNOSIS — K449 Diaphragmatic hernia without obstruction or gangrene: Secondary | ICD-10-CM | POA: Insufficient documentation

## 2019-01-30 DIAGNOSIS — Z825 Family history of asthma and other chronic lower respiratory diseases: Secondary | ICD-10-CM | POA: Insufficient documentation

## 2019-01-30 DIAGNOSIS — Z8042 Family history of malignant neoplasm of prostate: Secondary | ICD-10-CM | POA: Insufficient documentation

## 2019-01-30 DIAGNOSIS — F1120 Opioid dependence, uncomplicated: Secondary | ICD-10-CM | POA: Insufficient documentation

## 2019-01-30 DIAGNOSIS — Z79899 Other long term (current) drug therapy: Secondary | ICD-10-CM | POA: Insufficient documentation

## 2019-01-30 DIAGNOSIS — K766 Portal hypertension: Secondary | ICD-10-CM | POA: Diagnosis not present

## 2019-01-30 DIAGNOSIS — K3189 Other diseases of stomach and duodenum: Secondary | ICD-10-CM | POA: Diagnosis not present

## 2019-01-30 DIAGNOSIS — R1115 Cyclical vomiting syndrome unrelated to migraine: Secondary | ICD-10-CM | POA: Diagnosis not present

## 2019-01-30 DIAGNOSIS — J45909 Unspecified asthma, uncomplicated: Secondary | ICD-10-CM | POA: Insufficient documentation

## 2019-01-30 DIAGNOSIS — Z96652 Presence of left artificial knee joint: Secondary | ICD-10-CM | POA: Diagnosis not present

## 2019-01-30 DIAGNOSIS — K5909 Other constipation: Secondary | ICD-10-CM | POA: Insufficient documentation

## 2019-01-30 DIAGNOSIS — Z800 Family history of malignant neoplasm of digestive organs: Secondary | ICD-10-CM | POA: Diagnosis not present

## 2019-01-30 DIAGNOSIS — M5432 Sciatica, left side: Secondary | ICD-10-CM | POA: Insufficient documentation

## 2019-01-30 DIAGNOSIS — M19011 Primary osteoarthritis, right shoulder: Secondary | ICD-10-CM | POA: Diagnosis not present

## 2019-01-30 DIAGNOSIS — M75121 Complete rotator cuff tear or rupture of right shoulder, not specified as traumatic: Secondary | ICD-10-CM | POA: Diagnosis not present

## 2019-01-30 DIAGNOSIS — M549 Dorsalgia, unspecified: Secondary | ICD-10-CM | POA: Insufficient documentation

## 2019-01-30 DIAGNOSIS — I10 Essential (primary) hypertension: Secondary | ICD-10-CM | POA: Diagnosis not present

## 2019-01-30 DIAGNOSIS — B1920 Unspecified viral hepatitis C without hepatic coma: Secondary | ICD-10-CM | POA: Insufficient documentation

## 2019-01-30 DIAGNOSIS — M797 Fibromyalgia: Secondary | ICD-10-CM | POA: Insufficient documentation

## 2019-01-30 DIAGNOSIS — G709 Myoneural disorder, unspecified: Secondary | ICD-10-CM | POA: Insufficient documentation

## 2019-01-30 DIAGNOSIS — Z808 Family history of malignant neoplasm of other organs or systems: Secondary | ICD-10-CM | POA: Insufficient documentation

## 2019-01-30 DIAGNOSIS — F17210 Nicotine dependence, cigarettes, uncomplicated: Secondary | ICD-10-CM | POA: Insufficient documentation

## 2019-01-30 DIAGNOSIS — Z803 Family history of malignant neoplasm of breast: Secondary | ICD-10-CM | POA: Insufficient documentation

## 2019-01-30 DIAGNOSIS — K7460 Unspecified cirrhosis of liver: Secondary | ICD-10-CM | POA: Diagnosis not present

## 2019-01-30 DIAGNOSIS — K219 Gastro-esophageal reflux disease without esophagitis: Secondary | ICD-10-CM | POA: Diagnosis not present

## 2019-01-30 DIAGNOSIS — Z7951 Long term (current) use of inhaled steroids: Secondary | ICD-10-CM | POA: Insufficient documentation

## 2019-01-30 DIAGNOSIS — Z791 Long term (current) use of non-steroidal anti-inflammatories (NSAID): Secondary | ICD-10-CM | POA: Insufficient documentation

## 2019-01-30 DIAGNOSIS — G8929 Other chronic pain: Secondary | ICD-10-CM | POA: Diagnosis not present

## 2019-01-30 DIAGNOSIS — Z86010 Personal history of colonic polyps: Secondary | ICD-10-CM | POA: Insufficient documentation

## 2019-01-30 DIAGNOSIS — M75101 Unspecified rotator cuff tear or rupture of right shoulder, not specified as traumatic: Secondary | ICD-10-CM | POA: Diagnosis not present

## 2019-01-30 DIAGNOSIS — M1990 Unspecified osteoarthritis, unspecified site: Secondary | ICD-10-CM | POA: Insufficient documentation

## 2019-01-30 HISTORY — PX: SHOULDER OPEN ROTATOR CUFF REPAIR: SHX2407

## 2019-01-30 HISTORY — PX: RESECTION DISTAL CLAVICAL: SHX5053

## 2019-01-30 SURGERY — REPAIR, ROTATOR CUFF, OPEN
Anesthesia: General | Site: Shoulder | Laterality: Right

## 2019-01-30 MED ORDER — ROCURONIUM BROMIDE 50 MG/5ML IV SOSY
PREFILLED_SYRINGE | INTRAVENOUS | Status: DC | PRN
  Administered 2019-01-30: 30 mg via INTRAVENOUS

## 2019-01-30 MED ORDER — LIDOCAINE 2% (20 MG/ML) 5 ML SYRINGE
INTRAMUSCULAR | Status: AC
  Filled 2019-01-30: qty 5

## 2019-01-30 MED ORDER — CHLORHEXIDINE GLUCONATE 4 % EX LIQD: 60.0000 mL | Freq: Once | CUTANEOUS | Status: DC

## 2019-01-30 MED ORDER — FENTANYL CITRATE (PF) 100 MCG/2ML IJ SOLN
INTRAMUSCULAR | Status: DC | PRN
  Administered 2019-01-30 (×8): 50 ug via INTRAVENOUS
  Administered 2019-01-30: 100 ug via INTRAVENOUS

## 2019-01-30 MED ORDER — PROMETHAZINE HCL 25 MG/ML IJ SOLN: 6.2500 mg | INTRAMUSCULAR | Status: DC | PRN

## 2019-01-30 MED ORDER — PREGABALIN 50 MG PO CAPS
50.0000 mg | ORAL_CAPSULE | Freq: Once | ORAL | Status: AC
  Administered 2019-01-30: 50 mg via ORAL
  Filled 2019-01-30: qty 1

## 2019-01-30 MED ORDER — DEXAMETHASONE SODIUM PHOSPHATE 10 MG/ML IJ SOLN
INTRAMUSCULAR | Status: AC
  Filled 2019-01-30: qty 1

## 2019-01-30 MED ORDER — ONDANSETRON HCL 4 MG/2ML IJ SOLN
INTRAMUSCULAR | Status: AC
  Filled 2019-01-30: qty 2

## 2019-01-30 MED ORDER — IBUPROFEN 800 MG PO TABS: 800.0000 mg | ORAL_TABLET | Freq: Three times a day (TID) | ORAL | 0 refills | Status: DC | PRN

## 2019-01-30 MED ORDER — PROPOFOL 10 MG/ML IV BOLUS
INTRAVENOUS | Status: DC | PRN
  Administered 2019-01-30: 180 mg via INTRAVENOUS
  Administered 2019-01-30: 50 mg via INTRAVENOUS

## 2019-01-30 MED ORDER — DEXAMETHASONE SODIUM PHOSPHATE 4 MG/ML IJ SOLN
INTRAMUSCULAR | Status: DC | PRN
  Administered 2019-01-30: 8 mg via INTRAVENOUS

## 2019-01-30 MED ORDER — GLYCOPYRROLATE PF 0.2 MG/ML IJ SOSY
PREFILLED_SYRINGE | INTRAMUSCULAR | Status: AC
  Filled 2019-01-30: qty 1

## 2019-01-30 MED ORDER — HYDROCODONE-ACETAMINOPHEN 7.5-325 MG PO TABS: 1.0000 | ORAL_TABLET | Freq: Once | ORAL | Status: DC | PRN

## 2019-01-30 MED ORDER — PROPOFOL 10 MG/ML IV BOLUS
INTRAVENOUS | Status: AC
  Filled 2019-01-30: qty 20

## 2019-01-30 MED ORDER — ONDANSETRON HCL 4 MG/2ML IJ SOLN
INTRAMUSCULAR | Status: DC | PRN
  Administered 2019-01-30: 4 mg via INTRAVENOUS

## 2019-01-30 MED ORDER — SODIUM BICARBONATE 8.4 % IV SOLN
INTRAVENOUS | Status: AC
  Filled 2019-01-30: qty 50

## 2019-01-30 MED ORDER — KETOROLAC TROMETHAMINE 30 MG/ML IJ SOLN
INTRAMUSCULAR | Status: AC
  Filled 2019-01-30: qty 1

## 2019-01-30 MED ORDER — FENTANYL CITRATE (PF) 250 MCG/5ML IJ SOLN
INTRAMUSCULAR | Status: AC
  Filled 2019-01-30: qty 5

## 2019-01-30 MED ORDER — LIDOCAINE 2% (20 MG/ML) 5 ML SYRINGE
INTRAMUSCULAR | Status: DC | PRN
  Administered 2019-01-30: 50 mg via INTRAVENOUS

## 2019-01-30 MED ORDER — ARTIFICIAL TEARS OPHTHALMIC OINT
TOPICAL_OINTMENT | OPHTHALMIC | Status: AC
  Filled 2019-01-30: qty 3.5

## 2019-01-30 MED ORDER — ROCURONIUM BROMIDE 10 MG/ML (PF) SYRINGE
PREFILLED_SYRINGE | INTRAVENOUS | Status: AC
  Filled 2019-01-30: qty 10

## 2019-01-30 MED ORDER — SUCCINYLCHOLINE CHLORIDE 20 MG/ML IJ SOLN
INTRAMUSCULAR | Status: DC | PRN
  Administered 2019-01-30: 160 mg via INTRAVENOUS

## 2019-01-30 MED ORDER — ONDANSETRON HCL 4 MG/2ML IJ SOLN
INTRAMUSCULAR | Status: AC
  Filled 2019-01-30: qty 4

## 2019-01-30 MED ORDER — BUPIVACAINE LIPOSOME 1.3 % IJ SUSP
INTRAMUSCULAR | Status: DC | PRN
  Administered 2019-01-30: 20 mL

## 2019-01-30 MED ORDER — BUPIVACAINE LIPOSOME 1.3 % IJ SUSP
INTRAMUSCULAR | Status: AC
  Filled 2019-01-30: qty 20

## 2019-01-30 MED ORDER — VANCOMYCIN HCL IN DEXTROSE 1-5 GM/200ML-% IV SOLN
INTRAVENOUS | Status: AC
  Filled 2019-01-30: qty 200

## 2019-01-30 MED ORDER — LACTATED RINGERS IV SOLN
INTRAVENOUS | Status: DC
  Administered 2019-01-30 (×2): via INTRAVENOUS

## 2019-01-30 MED ORDER — ONDANSETRON HCL 4 MG/2ML IJ SOLN
4.0000 mg | Freq: Once | INTRAMUSCULAR | Status: AC
  Administered 2019-01-30: 4 mg via INTRAVENOUS
  Filled 2019-01-30: qty 2

## 2019-01-30 MED ORDER — LABETALOL HCL 5 MG/ML IV SOLN
INTRAVENOUS | Status: AC
  Filled 2019-01-30: qty 4

## 2019-01-30 MED ORDER — HYDROMORPHONE HCL 1 MG/ML IJ SOLN
0.2500 mg | INTRAMUSCULAR | Status: DC | PRN
  Administered 2019-01-30 (×4): 0.5 mg via INTRAVENOUS
  Filled 2019-01-30 (×4): qty 0.5

## 2019-01-30 MED ORDER — DEXTROSE 5 % IV SOLN
INTRAVENOUS | Status: DC | PRN
  Administered 2019-01-30: 11:00:00 via INTRAVENOUS

## 2019-01-30 MED ORDER — OXYCODONE HCL 5 MG PO TABS
5.0000 mg | ORAL_TABLET | Freq: Once | ORAL | Status: AC
  Administered 2019-01-30: 5 mg via ORAL
  Filled 2019-01-30: qty 1

## 2019-01-30 MED ORDER — BUPIVACAINE-EPINEPHRINE (PF) 0.5% -1:200000 IJ SOLN
INTRAMUSCULAR | Status: DC | PRN
  Administered 2019-01-30: 10 mL via PERINEURAL

## 2019-01-30 MED ORDER — SUGAMMADEX SODIUM 200 MG/2ML IV SOLN
INTRAVENOUS | Status: DC | PRN
  Administered 2019-01-30: 165.2 mg via INTRAVENOUS

## 2019-01-30 MED ORDER — 0.9 % SODIUM CHLORIDE (POUR BTL) OPTIME
TOPICAL | Status: DC | PRN
  Administered 2019-01-30: 1000 mL

## 2019-01-30 MED ORDER — BUPIVACAINE HCL (PF) 0.5 % IJ SOLN
INTRAMUSCULAR | Status: AC
  Filled 2019-01-30: qty 30

## 2019-01-30 MED ORDER — SUGAMMADEX SODIUM 500 MG/5ML IV SOLN
INTRAVENOUS | Status: AC
  Filled 2019-01-30: qty 5

## 2019-01-30 MED ORDER — BUPIVACAINE-EPINEPHRINE (PF) 0.5% -1:200000 IJ SOLN
INTRAMUSCULAR | Status: AC
  Filled 2019-01-30: qty 30

## 2019-01-30 MED ORDER — CYCLOBENZAPRINE HCL 5 MG PO TABS: 5.0000 mg | ORAL_TABLET | Freq: Three times a day (TID) | ORAL | 2 refills | Status: DC | PRN

## 2019-01-30 MED ORDER — EPHEDRINE 5 MG/ML INJ
INTRAVENOUS | Status: AC
  Filled 2019-01-30: qty 10

## 2019-01-30 MED ORDER — CELECOXIB 400 MG PO CAPS
400.0000 mg | ORAL_CAPSULE | Freq: Once | ORAL | Status: AC
  Administered 2019-01-30: 14:00:00 400 mg via ORAL
  Filled 2019-01-30: qty 1

## 2019-01-30 MED ORDER — SUCCINYLCHOLINE CHLORIDE 200 MG/10ML IV SOSY
PREFILLED_SYRINGE | INTRAVENOUS | Status: AC
  Filled 2019-01-30: qty 10

## 2019-01-30 MED ORDER — PHENYLEPHRINE HCL (PRESSORS) 10 MG/ML IV SOLN
INTRAVENOUS | Status: AC
  Filled 2019-01-30: qty 1

## 2019-01-30 MED ORDER — MIDAZOLAM HCL 2 MG/2ML IJ SOLN: 0.5000 mg | Freq: Once | INTRAMUSCULAR | Status: DC | PRN

## 2019-01-30 MED ORDER — OXYCODONE-ACETAMINOPHEN 5-325 MG PO TABS: 1.0000 | ORAL_TABLET | ORAL | 0 refills | Status: DC | PRN

## 2019-01-30 MED ORDER — METHOCARBAMOL 1000 MG/10ML IJ SOLN
500.0000 mg | Freq: Once | INTRAVENOUS | Status: AC
  Administered 2019-01-30: 500 mg via INTRAVENOUS
  Filled 2019-01-30: qty 5

## 2019-01-30 MED ORDER — PHENYLEPHRINE 40 MCG/ML (10ML) SYRINGE FOR IV PUSH (FOR BLOOD PRESSURE SUPPORT)
PREFILLED_SYRINGE | INTRAVENOUS | Status: AC
  Filled 2019-01-30: qty 10

## 2019-01-30 MED ORDER — GLYCOPYRROLATE PF 0.2 MG/ML IJ SOSY
PREFILLED_SYRINGE | INTRAMUSCULAR | Status: DC | PRN
  Administered 2019-01-30: .2 mg via INTRAVENOUS

## 2019-01-30 MED ORDER — VANCOMYCIN HCL IN DEXTROSE 1-5 GM/200ML-% IV SOLN
1000.0000 mg | INTRAVENOUS | Status: AC
  Administered 2019-01-30: 1000 mg via INTRAVENOUS

## 2019-01-30 SURGICAL SUPPLY — 62 items
BENZOIN TINCTURE PRP APPL 2/3 (GAUZE/BANDAGES/DRESSINGS) ×2 IMPLANT
BLADE HEX COATED 2.75 (ELECTRODE) ×3 IMPLANT
BLADE OSC/SAGITTAL MD 9X18.5 (BLADE) ×3 IMPLANT
BUR FAST CUTTING (BURR)
BUR ROUND 5.0 (BURR) ×2 IMPLANT
BUR SRG 54X4.7X12 FLUT (BURR) IMPLANT
BURR SRG 54X4.7X12 FLUT (BURR)
CHLORAPREP W/TINT 26 (MISCELLANEOUS) ×3 IMPLANT
CLOSURE WOUND 1/2 X4 (GAUZE/BANDAGES/DRESSINGS) ×1
CLOTH BEACON ORANGE TIMEOUT ST (SAFETY) ×3 IMPLANT
COVER LIGHT HANDLE STERIS (MISCELLANEOUS) ×6 IMPLANT
COVER WAND RF STERILE (DRAPES) ×2 IMPLANT
DRAPE ORTHO 2.5IN SPLIT 77X108 (DRAPES) ×2 IMPLANT
DRAPE ORTHO SPLIT 77X108 STRL (DRAPES) ×4
DRAPE PROXIMA HALF (DRAPES) ×3 IMPLANT
DRESSING ALLEVYN BORDER 5X5 (GAUZE/BANDAGES/DRESSINGS) ×3 IMPLANT
DRESSING MEPILEX BORDER 6X8 (GAUZE/BANDAGES/DRESSINGS) IMPLANT
DRSG MEPILEX BORDER 6X8 (GAUZE/BANDAGES/DRESSINGS) ×3
ELECT REM PT RETURN 9FT ADLT (ELECTROSURGICAL) ×3
ELECTRODE REM PT RTRN 9FT ADLT (ELECTROSURGICAL) ×1 IMPLANT
GLOVE BIO SURGEON STRL SZ7 (GLOVE) ×2 IMPLANT
GLOVE BIOGEL PI IND STRL 7.0 (GLOVE) ×1 IMPLANT
GLOVE BIOGEL PI INDICATOR 7.0 (GLOVE) ×6
GLOVE ECLIPSE 6.5 STRL STRAW (GLOVE) ×2 IMPLANT
GLOVE SKINSENSE NS SZ8.0 LF (GLOVE) ×2
GLOVE SKINSENSE STRL SZ8.0 LF (GLOVE) ×1 IMPLANT
GLOVE SS N UNI LF 8.5 STRL (GLOVE) ×3 IMPLANT
GOWN STRL REUS W/TWL LRG LVL3 (GOWN DISPOSABLE) ×6 IMPLANT
GOWN STRL REUS W/TWL XL LVL3 (GOWN DISPOSABLE) ×3 IMPLANT
HEMOSTAT SURGICEL 4X8 (HEMOSTASIS) ×2 IMPLANT
INST SET MINOR BONE (KITS) ×3 IMPLANT
KIT BLADEGUARD II DBL (SET/KITS/TRAYS/PACK) ×3 IMPLANT
KIT TURNOVER KIT A (KITS) ×3 IMPLANT
MANIFOLD NEPTUNE II (INSTRUMENTS) ×3 IMPLANT
MARKER SKIN DUAL TIP RULER LAB (MISCELLANEOUS) ×3 IMPLANT
NDL HYPO 21X1.5 SAFETY (NEEDLE) ×1 IMPLANT
NDL MAYO 6 CRC TAPER PT (NEEDLE) IMPLANT
NDL SCORPION MULTI FIRE (NEEDLE) IMPLANT
NEEDLE HYPO 21X1.5 SAFETY (NEEDLE) ×3 IMPLANT
NEEDLE MAYO 6 CRC TAPER PT (NEEDLE) ×3 IMPLANT
NEEDLE SCORPION MULTI FIRE (NEEDLE) ×3 IMPLANT
NS IRRIG 1000ML POUR BTL (IV SOLUTION) ×3 IMPLANT
PACK TOTAL JOINT (CUSTOM PROCEDURE TRAY) ×3 IMPLANT
PAD ARMBOARD 7.5X6 YLW CONV (MISCELLANEOUS) ×3 IMPLANT
PASSER SUT SWANSON 36MM LOOP (INSTRUMENTS) ×3 IMPLANT
RASP SM TEAR CROSS CUT (RASP) ×2 IMPLANT
SET BASIN LINEN APH (SET/KITS/TRAYS/PACK) ×3 IMPLANT
SLING ARM IMMOBILIZER MED (SOFTGOODS) ×2 IMPLANT
STAPLER VISISTAT 35W (STAPLE) ×2 IMPLANT
STRIP CLOSURE SKIN 1/2X4 (GAUZE/BANDAGES/DRESSINGS) ×1 IMPLANT
SUT ANCHOR PEEK 4.75X19.1M ×2 IMPLANT
SUT BONE WAX W31G (SUTURE) ×2 IMPLANT
SUT ETHIBOND NAB OS 4 #2 30IN (SUTURE) ×3 IMPLANT
SUT ETHILON 3 0 FSL (SUTURE) ×2 IMPLANT
SUT FIBERWIRE #2 38 T-5 BLUE (SUTURE) ×3
SUT MON AB 0 CT1 (SUTURE) ×3 IMPLANT
SUT MON AB 2-0 CT1 36 (SUTURE) ×3 IMPLANT
SUTURE FIBERWR #2 38 T-5 BLUE (SUTURE) IMPLANT
SYR 30ML LL (SYRINGE) ×3 IMPLANT
SYR BULB IRRIGATION 50ML (SYRINGE) ×3 IMPLANT
TAPE FIBER 2MM 7IN #2 BLUE (SUTURE) ×2 IMPLANT
YANKAUER SUCT 12FT TUBE ARGYLE (SUCTIONS) ×3 IMPLANT

## 2019-01-30 NOTE — Interval H&P Note (Signed)
History and Physical Interval Note:  01/30/2019 11:13 AM  Colleen Lee  has presented today for surgery, with the diagnosis of torn right rotator cuff.  The various methods of treatment have been discussed with the patient and family. After consideration of risks, benefits and other options for treatment, the patient has consented to  Procedure(s) with comments: [place] (Right) - pt to arrive at 7:30 for PICC at 8:00 as a surgical intervention.  The patient's history has been reviewed, patient examined, no change in status, stable for surgery.  I have reviewed the patient's chart and labs.  Questions were answered to the patient's satisfaction.     Arther Abbott

## 2019-01-30 NOTE — OR Nursing (Signed)
IV team called [phone] to let them know patient has arrived.  Per office she stated that that the nurse had left 20  Minutes ago to com and do the picc.  Patient in preop 5 waiting for IV nurse.

## 2019-01-30 NOTE — Anesthesia Preprocedure Evaluation (Signed)
Anesthesia Evaluation  Patient identified by MRN, date of birth, ID band Patient awake    Reviewed: Allergy & Precautions, NPO status , [name] Chart, lab work & pertinent test results  History of Anesthesia Complications (+) PONV and history of anesthetic complications  Airway Mallampati: II  TM Distance: >3 FB Neck ROM: Full    Dental no notable dental hx. (+) Edentulous Upper, Edentulous Lower   Pulmonary asthma , Current Smoker,  Smoker -1 ppd -smoked today  Encouraged to decrease while healing    Pulmonary exam normal breath sounds clear to auscultation       Cardiovascular Exercise Tolerance: Good hypertension, Pt. on medications and Pt. on home beta blockers negative cardio ROS Normal cardiovascular examI Rhythm:Regular Rate:Normal     Neuro/Psych  Neuromuscular disease negative psych ROS   GI/Hepatic GERD  Medicated and Controlled,(+) Hepatitis -, [place] Hep C T[place]'d [place] ~2017   Endo/Other  negative endocrine ROS  Renal/GU Renal InsufficiencyRenal disease  negative genitourinary   Musculoskeletal  (+) Arthritis , Osteoarthritis,  Fibromyalgia -, narcotic dependent  Abdominal   Peds negative pediatric ROS (+)  Hematology negative hematology ROS (+)   Anesthesia Other Findings   Reproductive/Obstetrics negative OB ROS                             Anesthesia Physical Anesthesia Plan  ASA: III  Anesthesia Plan: General   Post-op Pain Management:    Induction: Intravenous  PONV Risk Score and Plan: 3 and Dexamethasone, Ondansetron and Treatment may vary due to age or medical condition  Airway Management Planned: Oral ETT  Additional Equipment:   Intra-op Plan:   Post-operative Plan: Extubation in OR  Informed Consent: I have reviewed the patients History and Physical, chart, labs and discussed the procedure including the risks, benefits and alternatives for the  proposed anesthesia with the patient or authorized representative who has indicated his/her understanding and acceptance.     Dental advisory given  Plan Discussed with: CRNA  Anesthesia Plan Comments: (Plan Full PPE use  Plan GETA , d/w pt preop ISB for POPM-she stated she was not interested in this after explained)        Anesthesia Quick Evaluation

## 2019-01-30 NOTE — Progress Notes (Signed)
Peripherally Inserted Central Catheter/Midline Placement  The IV Nurse has discussed with the patient and/or persons authorized to consent for the patient, the purpose of this procedure and the potential benefits and risks involved with this procedure.  The benefits include less needle sticks, lab draws from the catheter, and the patient may be discharged home with the catheter. Risks include, but not limited to, infection, bleeding, blood clot (thrombus formation), and puncture of an artery; nerve damage and irregular heartbeat and possibility to perform a PICC exchange if needed/ordered by physician.  Alternatives to this procedure were also discussed.  Bard Power PICC patient education guide, fact sheet on infection prevention and patient information card has been provided to patient /or left at bedside.    PICC/Midline Placement Documentation  PICC Single Lumen 15/61/53 PICC Left Basilic 42 cm 2 cm (Active)  Indication for Insertion or Continuance of Line Poor Vasculature-patient has had multiple peripheral attempts or PIVs lasting less than 24 hours 01/30/19 0943  Exposed Catheter (cm) 2 cm 01/30/19 0943  Site Assessment Clean;Dry;Intact 01/30/19 0943  Line Status Flushed;Blood return noted;Saline locked 01/30/19 0943  Dressing Type Transparent;Securing device 01/30/19 0943  Dressing Status Clean;Dry;Intact;Antimicrobial disc in place 01/30/19 0943  Dressing Change Due 02/06/19 01/30/19 0943       Frances Maywood 01/30/2019, 9:56 AM

## 2019-01-30 NOTE — OR Nursing (Signed)
IV Nurse arrived to insert PICC.

## 2019-01-30 NOTE — Transfer of Care (Signed)
Immediate Anesthesia Transfer of Care Note  Patient: Colleen Lee  Procedure(s) Performed: ROTATOR CUFF REPAIR SHOULDER OPEN (Right Shoulder) RESECTION DISTAL CLAVICAL (Right Shoulder)  Patient Location: PACU  Anesthesia Type:General  Level of Consciousness: awake and alert   Airway & Oxygen Therapy: Patient Spontanous Breathing  Post-op Assessment: Report given to RN  Post vital signs: Reviewed and stable  Last Vitals:  Vitals Value Taken Time  BP 143/76 01/30/19 1345  Temp 37 C 01/30/19 1344  Pulse 85 01/30/19 1346  Resp 21 01/30/19 1346  SpO2 100 % 01/30/19 1346  Vitals shown include unvalidated device data.  Last Pain: There were no vitals filed for this visit.       Complications: No apparent anesthesia complications

## 2019-01-30 NOTE — Brief Op Note (Signed)
01/30/2019  1:31 PM  PATIENT:  Colleen Lee  64 y.o. female  PRE-OPERATIVE DIAGNOSIS:  torn right rotator cuff  POST-OPERATIVE DIAGNOSIS:  torn right rotator cuff  PROCEDURE:  Procedure(s) with comments: ROTATOR CUFF REPAIR SHOULDER OPEN (Right) - pt to arrive at 7:30 for PICC at 8:00 [place] (Right)   Operative findings  U-shaped tear of the supraspinatus and a portion of the infraspinatus Large hook on the acromion Arthritis of the acromioclavicular joint  Implants one fiber tape with 1 swivel lock anchor, multiple side to side sutures using FiberWire  SURGEON:  Surgeon(s) and Role:    Carole Civil, MD - Primary  Details of procedure This patient was seen in preop surgical site confirmed right shoulder marked chart review completed.  Patient taken to surgery.  General anesthesia administered.  Patient placed in the 45 degree upright position with a sandbag under the right scapula.  Preliminary range of motion testing was normal under general anesthesia  Sterile prep and drape was performed timeout was completed  A straight incision was made starting at the acromioclavicular joint and progressing distally to the tip of the coracoid just lateral to it  Subcutaneous tissue was divided.  Deltoid was identified and split between the middle and anterior third taking this dissection across the acromioclavicular joint subperiosteally.  The rotator cuff was immediately identified is a large U-shaped tear with retraction to just at the midpoint of the humeral head.  The posterior portion of the tear which involved a little bit of the anterior leading edge of the infraspinatus was noted with a large retracted central portion with the anterior portion of the cuff retracted more anteriorly and medially.  After mobilization superiorly and inferiorly it was noted that the cuff could be advanced with side to side type repair.  I therefore remove the anterior  acromial edge did an acromioplasty and a distal clavicle excision beveling the back of the clavicle.  I did run into some bleeding posteriorly and treated that with cautery Surgicel until hemostasis was obtained.  I did inverted mattress sutures side to side advancing the cuff until approximately 5 mm of greater tuberosity was still visible.  I then used a bur to excoriate the bone over the greater tuberosity and then placed an inverted mattress suture in the anterior part of the graft and then brought it more posteriorly using a swivel lock anchor.  I took the suture from the swivel lock anchor and used it to place an inverted mattress type suture back from the posterior portion of the cuff to the anterior portion of the cuff and tied that down which gave an excellent repair  Wound was irrigated and we then injected with 20 cc of undiluted exparel.  I repaired the deltoid split with #2 interrupted Ethibond suture.  I placed several 0 Monocryl sutures in the subcutaneous tissue and then did a running 2-0 Monocryl suture with Steri-Strips and benzoin  Sling was applied patient extubated taken recovery room in stable condition  PHYSICIAN ASSISTANT:   ASSISTANTS: Simonne Maffucci  ANESTHESIA:   General anesthesia EBL:  none   BLOOD ADMINISTERED:none  DRAINS: none   LOCAL MEDICATIONS USED:  MARCAINE     SPECIMEN:  No Specimen  DISPOSITION OF SPECIMEN:  N/A  COUNTS:  YES  TOURNIQUET:   See anesthesia record   DICTATION: .Dragon Dictation  PLAN OF CARE: Discharge to home after PACU  PATIENT DISPOSITION:  PACU - hemodynamically stable.   Delay  start of Pharmacological VTE agent (>24hrs) due to surgical blood loss or risk of bleeding: not applicable

## 2019-01-30 NOTE — Op Note (Signed)
01/30/2019  1:31 PM  PATIENT:  Colleen Lee  63 y.o. female  PRE-OPERATIVE DIAGNOSIS:  torn right rotator cuff  POST-OPERATIVE DIAGNOSIS:  torn right rotator cuff  PROCEDURE:  Procedure(s) with comments: ROTATOR CUFF REPAIR SHOULDER OPEN (Right) - pt to arrive at 7:30 for PICC at 8:00 [place] (Right)   Operative findings  U-shaped tear of the supraspinatus and a portion of the infraspinatus Large hook on the acromion Arthritis of the acromioclavicular joint  Implants one fiber tape with 1 swivel lock anchor, multiple side to side sutures using FiberWire  SURGEON:  Surgeon(s) and Role:    Carole Civil, MD - Primary  Details of procedure This patient was seen in preop surgical site confirmed right shoulder marked chart review completed.  Patient taken to surgery.  General anesthesia administered.  Patient placed in the 45 degree upright position with a sandbag under the right scapula.  Preliminary range of motion testing was normal under general anesthesia  Sterile prep and drape was performed timeout was completed  A straight incision was made starting at the acromioclavicular joint and progressing distally to the tip of the coracoid just lateral to it  Subcutaneous tissue was divided.  Deltoid was identified and split between the middle and anterior third taking this dissection across the acromioclavicular joint subperiosteally.  The rotator cuff was immediately identified is a large U-shaped tear with retraction to just at the midpoint of the humeral head.  The posterior portion of the tear which involved a little bit of the anterior leading edge of the infraspinatus was noted with a large retracted central portion with the anterior portion of the cuff retracted more anteriorly and medially.  After mobilization superiorly and inferiorly it was noted that the cuff could be advanced with side to side type repair.  I therefore remove the anterior  acromial edge did an acromioplasty and a distal clavicle excision beveling the back of the clavicle.  I did run into some bleeding posteriorly and treated that with cautery Surgicel until hemostasis was obtained.  I did inverted mattress sutures side to side advancing the cuff until approximately 5 mm of greater tuberosity was still visible.  I then used a bur to excoriate the bone over the greater tuberosity and then placed an inverted mattress suture in the anterior part of the graft and then brought it more posteriorly using a swivel lock anchor.  I took the suture from the swivel lock anchor and used it to place an inverted mattress type suture back from the posterior portion of the cuff to the anterior portion of the cuff and tied that down which gave an excellent repair  Wound was irrigated and we then injected with 20 cc of undiluted exparel.  I repaired the deltoid split with #2 interrupted Ethibond suture.  I placed several 0 Monocryl sutures in the subcutaneous tissue and then did a running 2-0 Monocryl suture with Steri-Strips and benzoin  Sling was applied patient extubated taken recovery room in stable condition  PHYSICIAN ASSISTANT:   ASSISTANTS: Simonne Maffucci  ANESTHESIA:   General anesthesia EBL:  none   BLOOD ADMINISTERED:none  DRAINS: none   LOCAL MEDICATIONS USED:  MARCAINE     SPECIMEN:  No Specimen  DISPOSITION OF SPECIMEN:  N/A  COUNTS:  YES  TOURNIQUET:   See anesthesia record   DICTATION: .Dragon Dictation  PLAN OF CARE: Discharge to home after PACU  PATIENT DISPOSITION:  PACU - hemodynamically stable.   Delay  start of Pharmacological VTE agent (>24hrs) due to surgical blood loss or risk of bleeding: not applicable

## 2019-01-30 NOTE — Anesthesia Postprocedure Evaluation (Signed)
Anesthesia Post Note  Patient: Colleen Lee  Procedure(s) Performed: ROTATOR CUFF REPAIR SHOULDER OPEN (Right Shoulder) RESECTION DISTAL CLAVICAL (Right Shoulder)  Patient location during evaluation: PACU Anesthesia Type: General Level of consciousness: awake and alert and oriented Pain management: pain level controlled Vital Signs Assessment: post-procedure vital signs reviewed and stable Respiratory status: spontaneous breathing Cardiovascular status: blood pressure returned to baseline and stable Postop Assessment: no apparent nausea or vomiting Anesthetic complications: no     Last Vitals:  Vitals:   01/30/19 1415 01/30/19 1430  BP: (!) 141/92 (!) 149/81  Pulse: 67 71  Resp: 14 13  Temp:    SpO2: 92% 91%    Last Pain:  Vitals:   01/30/19 1425  PainSc: 10-Worst pain ever                 [name]

## 2019-01-30 NOTE — Discharge Instructions (Signed)
Wear the sling at all times  You may need to sleep upright for 5 to 7 days in a recliner or on a sofa  You can remove the sling to change.  Keep your arm touching your body at all times do not move it away from your body  You need to wear the sling while sleeping as well  Bowl , cloth bath only       General Anesthesia, Adult, Care After This sheet gives you information about how to care for yourself after your procedure. Your health care provider may also give you more specific instructions. If you have problems or questions, contact your health care provider. What can I expect after the procedure? After the procedure, the following side effects are common:  Pain or discomfort at the IV site.  Nausea.  Vomiting.  Sore throat.  Trouble concentrating.  Feeling cold or chills.  Weak or tired.  Sleepiness and fatigue.  Soreness and body aches. These side effects can affect parts of the body that were not involved in surgery. Follow these instructions at home:  For at least 24 hours after the procedure:  Have a responsible adult stay with you. It is important to have someone help care for you until you are awake and alert.  Rest as needed.  Do not: ? Participate in activities in which you could fall or become injured. ? Drive. ? Use heavy machinery. ? Drink alcohol. ? Take sleeping pills or medicines that cause drowsiness. ? Make important decisions or sign legal documents. ? Take care of children on your own. Eating and drinking  Follow any instructions from your health care provider about eating or drinking restrictions.  When you feel hungry, start by eating small amounts of foods that are soft and easy to digest (bland), such as toast. Gradually return to your regular diet.  Drink enough fluid to keep your urine pale yellow.  If you vomit, rehydrate by drinking water, juice, or clear broth. General instructions  If you have sleep apnea, surgery and  certain medicines can increase your risk for breathing problems. Follow instructions from your health care provider about wearing your sleep device: ? Anytime you are sleeping, including during daytime naps. ? While taking prescription pain medicines, sleeping medicines, or medicines that make you drowsy.  Return to your normal activities as told by your health care provider. Ask your health care provider what activities are safe for you.  Take over-the-counter and prescription medicines only as told by your health care provider.  If you smoke, do not smoke without supervision.  Keep all follow-up visits as told by your health care provider. This is important. Contact a health care provider if:  You have nausea or vomiting that does not get better with medicine.  You cannot eat or drink without vomiting.  You have pain that does not get better with medicine.  You are unable to pass urine.  You develop a skin rash.  You have a fever.  You have redness around your IV site that gets worse. Get help right away if:  You have difficulty breathing.  You have chest pain.  You have blood in your urine or stool, or you vomit blood. Summary  After the procedure, it is common to have a sore throat or nausea. It is also common to feel tired.  Have a responsible adult stay with you for the first 24 hours after general anesthesia. It is important to have someone help care for  you until you are awake and alert.  When you feel hungry, start by eating small amounts of foods that are soft and easy to digest (bland), such as toast. Gradually return to your regular diet.  Drink enough fluid to keep your urine pale yellow.  Return to your normal activities as told by your health care provider. Ask your health care provider what activities are safe for you. This information is not intended to replace advice given to you by your health care provider. Make sure you discuss any questions you have  with your health care provider. Document Released: 10/25/2000 Document Revised: 07/22/2017 Document Reviewed: 03/04/2017 Elsevier Patient Education  2020 [place] Removal, Adult, Care After This sheet gives you information about how to care for yourself after your procedure. Your health care provider may also give you more specific instructions. If you have problems or questions, contact your health care provider. What can I expect after the procedure? After your procedure, it is common to have:  Tenderness or soreness.  Redness, swelling, or a scab where the PICC was removed (exit site). Follow these instructions at home: For the first 24 hours after the procedure   Keep the bandage (dressing) on the exit site clean and dry. Do not remove the dressing until your health care provider tells you to do so.  Check your arm often for signs and symptoms of an infection. Check for: ? A red streak that spreads away from the dressing. ? Blood or fluid that you can see on the dressing. ? More redness or swelling.  Do not lift anything heavy or do activities that require great effort until your health care provider says it is okay. You should avoid: ? Lifting weights. ? Yard work. ? Any physical activity with repetitive arm movement.  Watch closely for any signs of an air bubble in the vein (air embolism). This is a rare but serious complication. If you have signs of air embolism, call 911 immediately and lie down on your left side to keep the air from moving into the lungs. Signs of an air embolism include: ? Difficulty breathing. ? Chest pain. ? Coughing or wheezing. ? Skin that is pale, blue, cold, or clammy. ? Rapid pulse. ? Rapid breathing. ? Fainting. After 24 Hours have passed:  Remove your dressing as told by your health care provider. Make sure you wash your hands with soap and water before and after you change the dressing. If soap and water are not available, use  hand sanitizer.  Return to your normal activities as told by your health care provider.  A small scab may develop over the exit site. Do not pick at the scab.  When bathing or showering, gently wash the exit site with soap and water. Pat it dry.  Watch for signs of infection, such as: ? Fever or chills. ? Swollen glands under the arm. ? More redness, swelling, or soreness in the arm. ? Blood, fluid, or pus coming from the exit site. ? Warmth or a bad smell at the exit site. ? A red streak spreading away from the exit site. General instructions  Take over-the-counter and prescription medicines only as told by your health care provider. Do not take any new medicines without checking with your health care provider first.  If you were prescribed an antibiotic medicine, apply or take it as told by your health care provider. Do not stop using the antibiotic even if your condition improves.  Keep all follow-up visits as told by your health care provider. This is important. Contact a health care provider if:  You have a fever or chills.  You have soreness, redness, or swelling on your exit site, and it gets worse.  You have swollen glands under your arm.  You have any of the following symptoms at your exit site: ? Blood, fluid, or pus. ? Unusual warmth. ? A bad smell. ? A red streak spreading away from the exit site. Get help right away if:  You have numbness or tingling in your fingers, hand, or arm.  Your arm looks blue and feels cold.  You have signs of an air embolism, such as: ? Difficulty breathing. ? Chest pain. ? Coughing or wheezing. ? Skin that is pale, blue, cold, or clammy. ? Rapid pulse. ? Rapid breathing. ? Fainting. These symptoms may represent a serious problem that is an emergency. Do not wait to see if the symptoms will go away. Get medical help right away. Call your local emergency services (911 in the [place] Do not drive yourself to the  hospital. Summary  After your procedure, it is common to have tenderness or soreness, redness, swelling, or a scab at the exit site.  Keep the dressing over the exit site clean and dry. Do not remove the dressing until your health care provider tells you to do so.  Do not lift anything heavy or do activities that require great effort until your health care provider says it is okay.  Watch closely for any signs of an air embolism. If you have signs of air embolism, call 911 immediately and lie down on your left side. This information is not intended to replace advice given to you by your health care provider. Make sure you discuss any questions you have with your health care provider. Document Released: 07/24/2013 Document Revised: 07/01/2017 Document Reviewed: 09/14/2016 Elsevier Patient Education  2020 [employer].

## 2019-01-30 NOTE — Anesthesia Procedure Notes (Signed)
Procedure Name: Intubation Date/Time: 01/30/2019 11:28 AM Performed by: Andree Elk, Lorriane Dehart A, CRNA Pre-anesthesia Checklist: Patient identified, Patient being monitored, Timeout performed, Emergency Drugs available and Suction available Patient Re-evaluated:Patient Re-evaluated prior to induction Oxygen Delivery Method: Circle system utilized Preoxygenation: Pre-oxygenation with 100% oxygen Induction Type: IV induction Ventilation: Mask ventilation without difficulty Laryngoscope Size: Mac and 3 Grade View: Grade I Tube type: Oral Tube size: 7.0 mm Number of attempts: 1 Airway Equipment and Method: Stylet Placement Confirmation: ETT inserted through vocal cords under direct vision,  positive ETCO2 and breath sounds checked- equal and bilateral Secured at: 21 cm Tube secured with: Tape Dental Injury: Teeth and Oropharynx as per pre-operative assessment

## 2019-02-01 ENCOUNTER — Encounter (HOSPITAL_COMMUNITY): Payer: Self-pay | Admitting: Orthopedic Surgery

## 2019-02-01 DIAGNOSIS — J449 Chronic obstructive pulmonary disease, unspecified: Secondary | ICD-10-CM | POA: Diagnosis not present

## 2019-02-06 DIAGNOSIS — Z9889 Other specified postprocedural states: Secondary | ICD-10-CM | POA: Insufficient documentation

## 2019-02-07 ENCOUNTER — Other Ambulatory Visit: Payer: Self-pay

## 2019-02-07 ENCOUNTER — Ambulatory Visit (INDEPENDENT_AMBULATORY_CARE_PROVIDER_SITE_OTHER): Payer: Medicare Other | Admitting: Orthopedic Surgery

## 2019-02-07 VITALS — BP 102/66 | HR 61 | Temp 98.6°F | Wt 182.2 lb

## 2019-02-07 DIAGNOSIS — G8918 Other acute postprocedural pain: Secondary | ICD-10-CM

## 2019-02-07 DIAGNOSIS — Z9889 Other specified postprocedural states: Secondary | ICD-10-CM

## 2019-02-07 MED ORDER — OXYCODONE-ACETAMINOPHEN 5-325 MG PO TABS: 1.0000 | ORAL_TABLET | ORAL | 0 refills | Status: DC | PRN

## 2019-02-07 MED ORDER — CYCLOBENZAPRINE HCL 5 MG PO TABS: 5.0000 mg | ORAL_TABLET | Freq: Three times a day (TID) | ORAL | 2 refills | Status: DC | PRN

## 2019-02-07 NOTE — Progress Notes (Signed)
POST OP VISIT   Chief Complaint  Patient presents with  . Shoulder Pain    postop left shld 01/30/19    Encounter Diagnoses  Name Primary?  . S/P right rotator cuff repair 01/30/19   . Post-operative pain Yes   01/30/2019  1:31 PM  PATIENT:  Colleen Lee  63 y.o. female  PRE-OPERATIVE DIAGNOSIS:  torn right rotator cuff  POST-OPERATIVE DIAGNOSIS:  torn right rotator cuff  PROCEDURE:  Procedure(s) with comments: ROTATOR CUFF REPAIR SHOULDER OPEN (Right) - pt to arrive at 7:30 for PICC at 8:00 [place] (Right)   Operative findings  U-shaped tear of the supraspinatus and a portion of the infraspinatus Large hook on the acromion Arthritis of the acromioclavicular joint  Implants one fiber tape with 1 swivel lock anchor, multiple side to side sutures using FiberWire  SURGEON:  Surgeon(s) and Role:    Carole Civil, MD - Primary    Current Outpatient Medications:  .  albuterol (PROVENTIL HFA;VENTOLIN HFA) 108 (90 Base) MCG/ACT inhaler, Inhale 1-2 puffs into the lungs every 6 (six) hours as needed for wheezing or shortness of breath. , Disp: , Rfl:  .  budesonide-formoterol (SYMBICORT) 160-4.5 MCG/ACT inhaler, Inhale 2 puffs into the lungs daily. , Disp: , Rfl:  .  Cyanocobalamin (VITAMIN B 12 PO), Take 1 tablet by mouth daily., Disp: , Rfl:  .  cyclobenzaprine (FLEXERIL) 5 MG tablet, Take 1 tablet (5 mg total) by mouth 3 (three) times daily as needed for muscle spasms., Disp: 42 tablet, Rfl: 2 .  gabapentin (NEURONTIN) 100 MG capsule, Take 1 capsule (100 mg total) by mouth 3 (three) times daily. (Patient taking differently: Take 100 mg by mouth daily. ), Disp: 90 capsule, Rfl: 2 .  hydrALAZINE (APRESOLINE) 50 MG tablet, Take 1 tablet (50 mg total) by mouth every 8 (eight) hours., Disp: 90 tablet, Rfl: 1 .  hydrochlorothiazide (HYDRODIURIL) 12.5 MG tablet, Take 12.5 mg by mouth daily. , Disp: , Rfl:  .  hydrOXYzine (ATARAX/VISTARIL) 10 MG tablet, Take  10 mg by mouth daily. , Disp: , Rfl:  .  ibuprofen (ADVIL) 800 MG tablet, Take 1 tablet (800 mg total) by mouth every 8 (eight) hours as needed., Disp: 90 tablet, Rfl: 0 .  labetalol (NORMODYNE) 100 MG tablet, Take 2 tablets (200 mg total) by mouth 2 (two) times daily., Disp: 120 tablet, Rfl: 1 .  losartan (COZAAR) 100 MG tablet, Take 1 tablet (100 mg total) by mouth daily., Disp: 30 tablet, Rfl: 1 .  megestrol (MEGACE) 40 MG tablet, Take 40 mg by mouth daily., Disp: , Rfl:  .  metoCLOPramide (REGLAN) 10 MG tablet, Take 10 mg by mouth daily before breakfast. , Disp: , Rfl:  .  ondansetron (ZOFRAN ODT) 4 MG disintegrating tablet, Take 1 tablet (4 mg total) by mouth 3 (three) times daily. 4mg  ODT q4 hours prn nausea/vomit (Patient taking differently: Take 4 mg by mouth every 8 (eight) hours as needed for nausea or vomiting. ), Disp: 60 tablet, Rfl: 0 .  oxyCODONE-acetaminophen (PERCOCET) 5-325 MG tablet, Take 1 tablet by mouth every 4 (four) hours as needed for severe pain., Disp: 42 tablet, Rfl: 0 .  pantoprazole (PROTONIX) 40 MG tablet, Take 1 tablet (40 mg total) by mouth 2 (two) times daily before a meal., Disp: 60 tablet, Rfl: 0 .  TRULANCE 3 MG TABS, TAKE 1 TABLET BY MOUTH ONCE A DAY. (Patient taking differently: Take 3 mg by mouth daily. ), Disp: 30  tablet, Rfl: 5  SHE SAYS that her pain is controlled well and she has discomfort but not a tremendous amount of pain.  She would like some medicine for muscle relaxation  THE WOUND LOOKS CLEAN AND THERE ARE NO SIGNS OF ERYTHEMA  DRESSING CHANGED   NEUROVASCULAR EXAM IS NORMAL   EVERYTHING LOOKS GOOD   FU WILL BE SCHEDULED FOR 1 WEEK FOR WOUND CHECK AND ORDER PT

## 2019-02-07 NOTE — Patient Instructions (Signed)
I keep your sling on at all times except for bathing and sleeping Do not move the arm away from your body  Take your medication use ice as needed to control swelling  Come back in a week for [place] to check your wound and at that time we will advised on starting physical therapy

## 2019-02-13 ENCOUNTER — Other Ambulatory Visit: Payer: Self-pay | Admitting: Radiology

## 2019-02-13 DIAGNOSIS — G8918 Other acute postprocedural pain: Secondary | ICD-10-CM

## 2019-02-13 DIAGNOSIS — Z9889 Other specified postprocedural states: Secondary | ICD-10-CM

## 2019-02-13 MED ORDER — OXYCODONE-ACETAMINOPHEN 5-325 MG PO TABS: 1.0000 | ORAL_TABLET | ORAL | 0 refills | Status: DC | PRN

## 2019-02-13 NOTE — Telephone Encounter (Signed)
Patient called, requests refill oxycodone.   [facility].

## 2019-02-14 ENCOUNTER — Other Ambulatory Visit: Payer: Self-pay

## 2019-02-14 ENCOUNTER — Ambulatory Visit (INDEPENDENT_AMBULATORY_CARE_PROVIDER_SITE_OTHER): Payer: Medicare Other | Admitting: Orthopedic Surgery

## 2019-02-14 VITALS — Temp 97.2°F | Ht 66.0 in | Wt 182.0 lb

## 2019-02-14 DIAGNOSIS — Z9889 Other specified postprocedural states: Secondary | ICD-10-CM

## 2019-02-14 NOTE — Patient Instructions (Signed)
Sling wear 2 weeks  Start therapy

## 2019-02-14 NOTE — Progress Notes (Signed)
Chief Complaint  Patient presents with  . Routine Post Op    Rt shoulder DOS 01/30/19    Postop visit #2 postop day 15 wound check  Stable Steri-Strips were removed dressing was replaced  Recommend physical therapy to start  Current pain medication Percocet gabapentin and ibuprofen and Flexeril  Recommend 4-week follow-up  Currently pain is well controlled wounds are fine patient is doing well

## 2019-02-19 IMAGING — DX DG ABDOMEN ACUTE W/ 1V CHEST
3 series · 3 of 3 positions shown · non-contrast
Comparison: 12/17/2016

CLINICAL DATA: Abdominal pain, nausea and vomiting, and diarrhea
for several days. Cirrhosis.

EXAM:
DG ABDOMEN ACUTE W/ 1V CHEST

[chest pa]
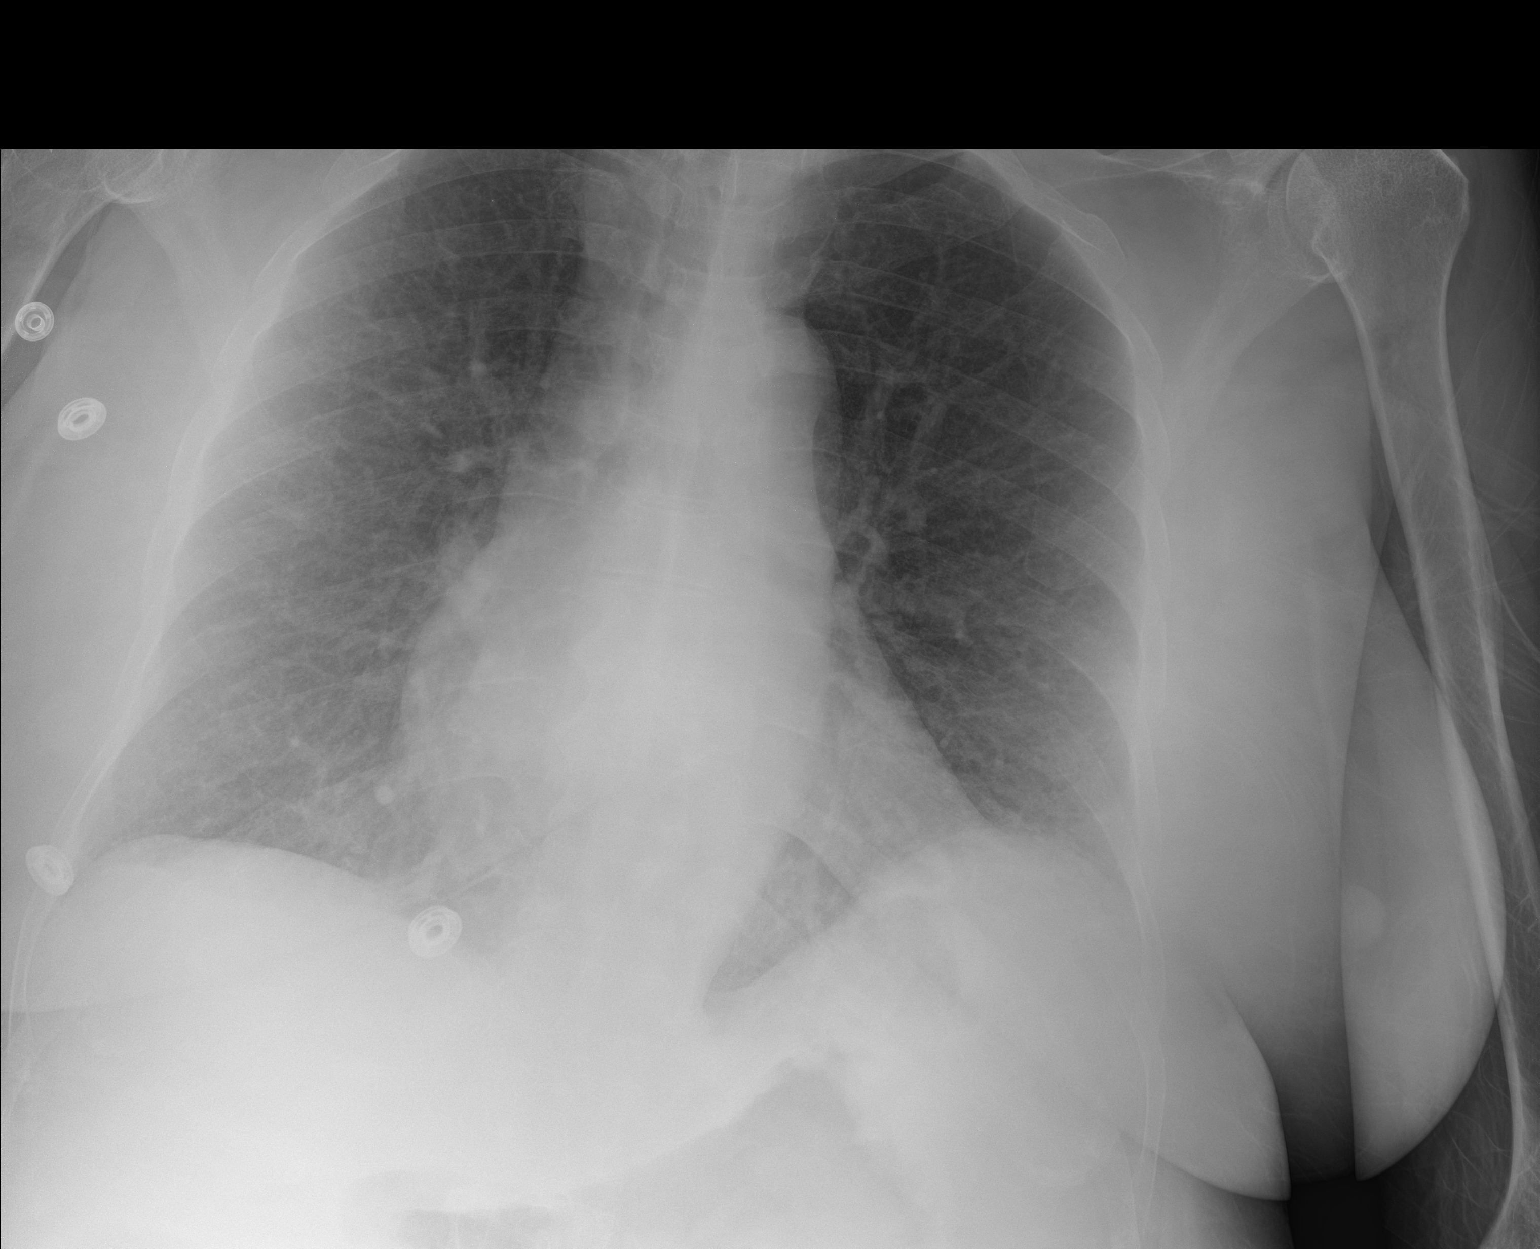

[abdomen erect]
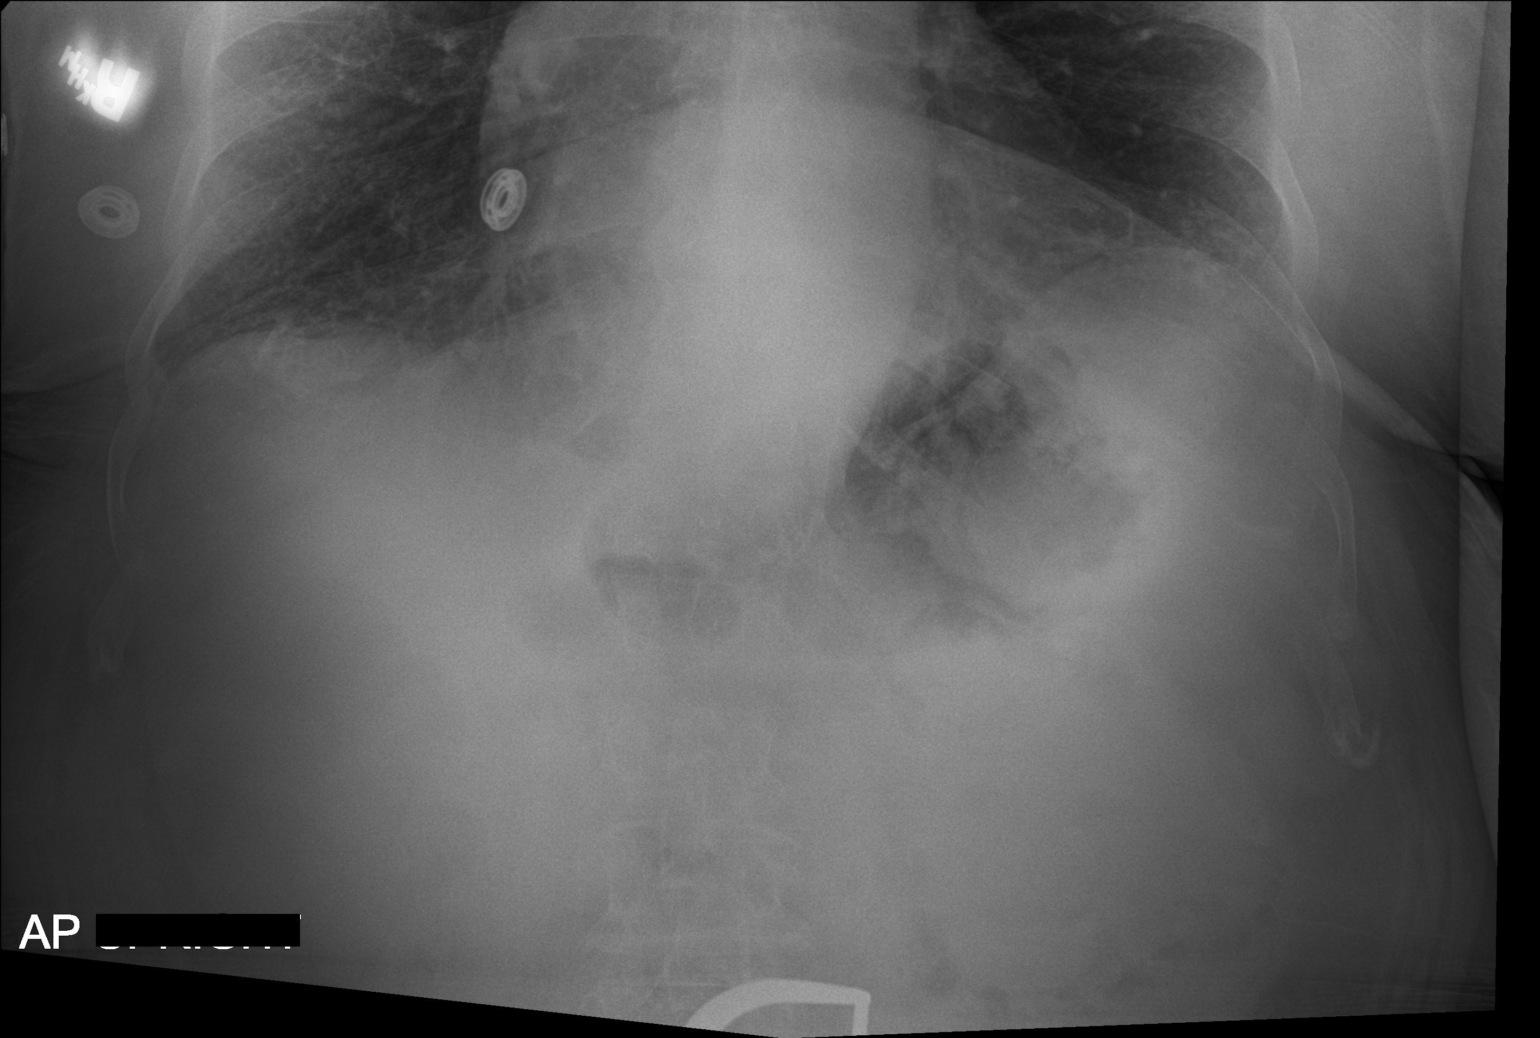

[abdomen supine]
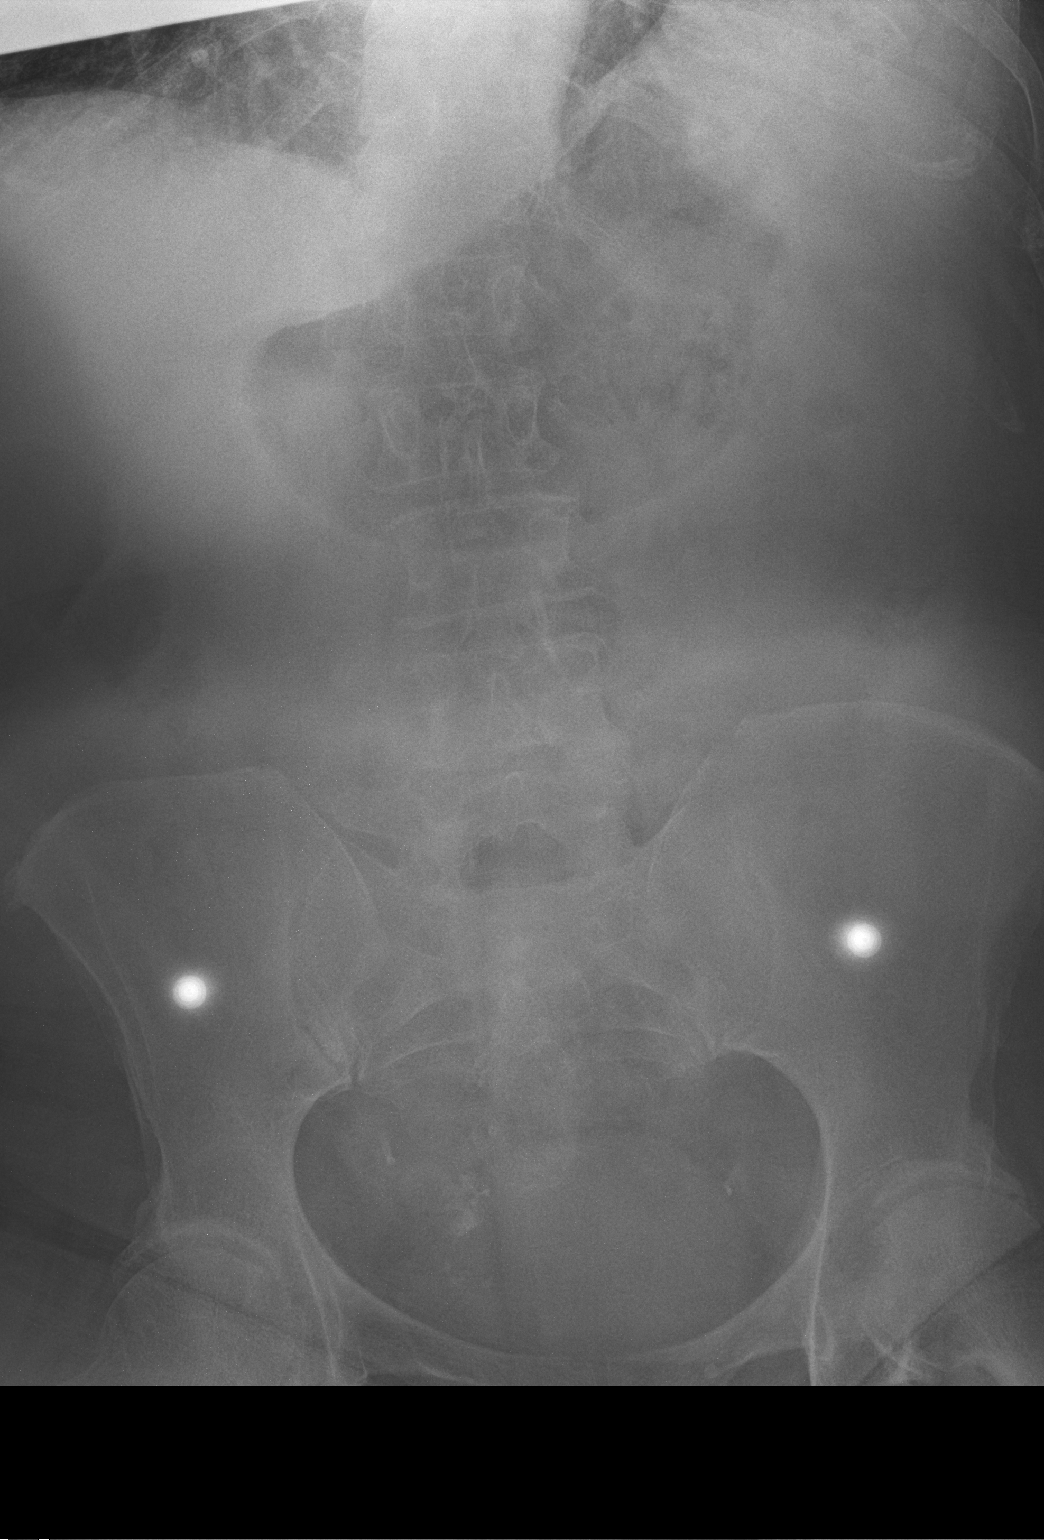

[3 of 3 positions shown; findings below may reference images not displayed]

FINDINGS: There is no evidence of dilated bowel loops or free intraperitoneal
air. Paucity of bowel gas noted. Small calcified fibroid seen in the
right pelvis. Vascular calcification also noted.

Heart size and mediastinal contours are within normal limits. Stable
mild elevation of left hemidiaphragm. Both lungs are clear.
IMPRESSION: Unremarkable bowel gas pattern. Small calcified uterine fibroid
again noted.

No active cardiopulmonary disease.

## 2019-02-20 ENCOUNTER — Other Ambulatory Visit: Payer: Self-pay | Admitting: Orthopedic Surgery

## 2019-02-20 DIAGNOSIS — G8918 Other acute postprocedural pain: Secondary | ICD-10-CM

## 2019-02-20 DIAGNOSIS — Z9889 Other specified postprocedural states: Secondary | ICD-10-CM

## 2019-02-20 MED ORDER — OXYCODONE-ACETAMINOPHEN 5-325 MG PO TABS: 1.0000 | ORAL_TABLET | ORAL | 0 refills | Status: DC | PRN

## 2019-02-20 NOTE — Telephone Encounter (Signed)
Patient requests refill on Oxycodone/Acetaminophen 5-325  Mgs.   Qty  42  Sig: Take 1 tablet by mouth every 4 (four) hours as needed for severe pain.  Patient states she uses [facility]

## 2019-02-22 DIAGNOSIS — I10 Essential (primary) hypertension: Secondary | ICD-10-CM | POA: Diagnosis not present

## 2019-02-22 DIAGNOSIS — K219 Gastro-esophageal reflux disease without esophagitis: Secondary | ICD-10-CM | POA: Diagnosis not present

## 2019-02-22 DIAGNOSIS — Z1389 Encounter for screening for other disorder: Secondary | ICD-10-CM | POA: Diagnosis not present

## 2019-02-22 DIAGNOSIS — M549 Dorsalgia, unspecified: Secondary | ICD-10-CM | POA: Diagnosis not present

## 2019-02-22 DIAGNOSIS — J449 Chronic obstructive pulmonary disease, unspecified: Secondary | ICD-10-CM | POA: Diagnosis not present

## 2019-02-27 ENCOUNTER — Other Ambulatory Visit: Payer: Self-pay | Admitting: Orthopedic Surgery

## 2019-02-27 ENCOUNTER — Other Ambulatory Visit: Payer: Self-pay

## 2019-02-27 DIAGNOSIS — Z9889 Other specified postprocedural states: Secondary | ICD-10-CM

## 2019-02-27 DIAGNOSIS — G8918 Other acute postprocedural pain: Secondary | ICD-10-CM

## 2019-02-27 MED ORDER — HYDROCODONE-ACETAMINOPHEN 10-325 MG PO TABS: 1.0000 | ORAL_TABLET | ORAL | 0 refills | Status: DC | PRN

## 2019-02-27 NOTE — Telephone Encounter (Signed)
Oxycodone-Acetaminophen  5/325 mg  Qty 42 Tablets  Take 1 tablet by mouth every 4 (four) hours as needed for severe pain.  PATIENT USES [facility]

## 2019-02-28 ENCOUNTER — Other Ambulatory Visit: Payer: Self-pay | Admitting: Orthopedic Surgery

## 2019-02-28 ENCOUNTER — Other Ambulatory Visit: Payer: Self-pay

## 2019-02-28 MED ORDER — HYDROCODONE-ACETAMINOPHEN 10-325 MG PO TABS: 1.0000 | ORAL_TABLET | ORAL | 0 refills | Status: DC | PRN

## 2019-02-28 NOTE — Telephone Encounter (Signed)
Patient called to check on her pain medicine, stated she checked with [facility] and they had not received anything for her from our office. I checked and it was done but it says Print.   Hydrocodone-Acetaminophen 10/325 mg  Qty 42 Tablets  Take 1 tablet by mouth every 4 (four) hours as needed.  [facility]

## 2019-03-01 ENCOUNTER — Ambulatory Visit (INDEPENDENT_AMBULATORY_CARE_PROVIDER_SITE_OTHER): Payer: Medicare Other | Admitting: Gastroenterology

## 2019-03-01 ENCOUNTER — Other Ambulatory Visit: Payer: Self-pay

## 2019-03-01 ENCOUNTER — Encounter: Payer: Self-pay | Admitting: Gastroenterology

## 2019-03-01 VITALS — BP 148/89 | HR 80 | Temp 99.0°F | Ht 66.0 in | Wt 189.0 lb

## 2019-03-01 DIAGNOSIS — R188 Other ascites: Secondary | ICD-10-CM | POA: Diagnosis not present

## 2019-03-01 DIAGNOSIS — K5900 Constipation, unspecified: Secondary | ICD-10-CM | POA: Diagnosis not present

## 2019-03-01 DIAGNOSIS — K7460 Unspecified cirrhosis of liver: Secondary | ICD-10-CM | POA: Diagnosis not present

## 2019-03-01 NOTE — Patient Instructions (Signed)
Please complete the Hepatitis B vaccinations.   We will see you in 6 months!  I enjoyed seeing you again today! As you know, I value our relationship and want to provide genuine, compassionate, and quality care. I welcome your feedback. If you receive a survey regarding your visit,  I greatly appreciate you taking time to fill this out. See you next time!  Annitta Needs, PhD, ANP-BC [facility]

## 2019-03-01 NOTE — Assessment & Plan Note (Signed)
Doing well with Trulance daily. Previous episode of diarrhea self-limiting and now back to baseline. Return in 6 months.

## 2019-03-01 NOTE — Progress Notes (Signed)
Referring Provider: Rosita Fire, MD Primary Care Physician:  Rosita Fire, MD Primary GI: Dr. Gala Romney  Chief Complaint  Patient presents with  . Cirrhosis    doing ok    HPI:   Colleen Lee is a 63 y.o. female presenting today with a history of cirrhosissecondary to ETOH and history of HCV s/p treatment and achieving SVR, compensated at this time, and known median arcuate ligament syndrome s/p mesenteric duplex April 2019documenting patent vasculature of SMA, unable to visualize IMA, celiac velocities normal but increasedwith expiration, consistent with known median arcuate ligament syndrome.EGD May 2019  with benign appearing esophageal stenosis s/p dilation,mild pyloric stenosis s/p dilation, mild duodenitis. Evaluated by Vascular previously with recommendations to return as needed.   Constipation: doing well with Trulance. At last visit had loose stools and stopped Trulance. Returned to baseline, requiring regimen again. Now without diarrhea. No rectal bleeding. No abdominal pain.   Cirrhosis: due for [place] in Sept 2020. No swelling in abdomen or legs. Not drinking any ETOH. Immune to Hep A. Unknown if she ever received Hep B vaccinations.   GERD: Protonix 40 mg daily with good control.   Just had right shoulder surgery. PT to start soon.   Past Medical History:  Diagnosis Date  . Asthma   . Chronic abdominal pain   . Chronic back pain   . Chronic constipation   . Cirrhosis ([place])    Metavir score F4 on elastography 2015  . Cyclical vomiting   . Fibroids   . Fibromyalgia   . GERD (gastroesophageal reflux disease)   . H. pylori infection 2014   treated with pylera, had to be treated again as it was not eradicated. Urea breath test then negative after subsequent treatment.   . [name]    HCV RNA positive 09/2012  . Hypertension   . Marijuana use   . Nausea and vomiting    chronic, recurrent  . PONV (postoperative nausea and vomiting)    pt thinks maybe once  she had N&V  . Sciatica of left side     Past Surgical History:  Procedure Laterality Date  . BALLOON DILATION  12/20/2017   Procedure: BALLOON DILATION;  Surgeon: Danie Binder, MD;  [facility];  Service: Endoscopy;;  pyloric dilation  . BIOPSY  12/20/2017   Procedure: BIOPSY;  Surgeon: Danie Binder, MD;  Location: [facility];  Service: Endoscopy;;  duodenal and gastric biopsy  . COLONOSCOPY  01/18/2008   RXV:[id number] rectum.  Long redundant colon, a diminutive sigmoid polyp status post cold biopsy removed. Hyperplastic polyp. Repeat colonoscopy June 2014 due to family history of colon cancer  . COLONOSCOPY WITH ESOPHAGOGASTRODUODENOSCOPY (EGD) N/A 11/02/2012   YPP:[id number] gastric mucosa of doubtful, +H.pylori. Incomplete colonoscopy due to patient unable to tolerate exam, proximal colon seen. Patient refused ACBE.  Marland Kitchen COLONOSCOPY WITH PROPOFOL N/A 03/08/2013   Dr. Gala Romney: colonic polyp-removed as scribed above. Internal Hemorrhoids. Pathology did not reveal any colonic tissue, only mucus. SURVEILLANCE DUE Aug 2019  . COLONOSCOPY WITH PROPOFOL N/A 01/19/2018   Dr. Gala Romney: 6 mm cecal inflammatoy polyp, otherwise normal., Surveilance in 5 year s  . ESOPHAGEAL DILATION  12/20/2017   Procedure: ESOPHAGEAL DILATION;  Surgeon: Danie Binder, MD;  [facility]: [facility];  Service: Endoscopy;;  . ESOPHAGOGASTRODUODENOSCOPY  01/18/2008   RMR: Normal esophagus, normal  stomach  . ESOPHAGOGASTRODUODENOSCOPY (EGD) WITH PROPOFOL N/A 02/09/2016   Normal esophagus, small hiatal hernia, portal hypertensive gastropathy, normal  second portion of duodenum. Due in July 2019  . ESOPHAGOGASTRODUODENOSCOPY (EGD) WITH PROPOFOL N/A 12/20/2017   Dr. Oneida Alar: benign appearing esophageal stenosis s/p dilation, mild pyloric stenosis s/p biopsy and dilation, mild duodenitis.   Marland Kitchen HYSTEROSCOPY  05/11/2016   Procedure: HYSTEROSCOPY;  Surgeon: Jonnie Kind, MD;  Location: [facility];  Service: Gynecology;;  .  KNEE SURGERY     left knee  . MULTIPLE EXTRACTIONS WITH ALVEOLOPLASTY N/A 10/15/2013   Procedure: MULTIPLE EXTRACTION WITH ALVEOLOPLASTY;  Surgeon: Gae Bon, DDS;  Location: [place];  Service: Oral Surgery;  Laterality: N/A;  . POLYPECTOMY N/A 03/08/2013   Procedure: POLYPECTOMY;  Surgeon: Daneil Dolin, MD;  Location: [facility];  Service: Endoscopy;  Laterality: N/A;  . POLYPECTOMY N/A 05/11/2016   Procedure: REMOVAL OF ENDOMETRIAL POLYP;  Surgeon: Jonnie Kind, MD;  Location: [facility];  Service: Gynecology;  Laterality: N/A;  . RESECTION DISTAL CLAVICAL Right 01/30/2019   Procedure: RESECTION DISTAL CLAVICAL;  Surgeon: Carole Civil, MD;  Location: [facility];  Service: Orthopedics;  Laterality: Right;  . SHOULDER OPEN ROTATOR CUFF REPAIR Right 01/30/2019   Procedure: ROTATOR CUFF REPAIR SHOULDER OPEN;  Surgeon: Carole Civil, MD;  Location: [facility];  Service: Orthopedics;  Laterality: Right;  pt to arrive at 7:30 for PICC at 8:00  . TOTAL KNEE ARTHROPLASTY Left 02/05/2015   Procedure: LEFT TOTAL KNEE ARTHROPLASTY;  Surgeon: Carole Civil, MD;  Location: [facility];  Service: Orthopedics;  Laterality: Left;    Current Outpatient Medications  Medication Sig Dispense Refill  . albuterol (PROVENTIL HFA;VENTOLIN HFA) 108 (90 Base) MCG/ACT inhaler Inhale 1-2 puffs into the lungs every 6 (six) hours as needed for wheezing or shortness of breath.     . budesonide-formoterol (SYMBICORT) 160-4.5 MCG/ACT inhaler Inhale 2 puffs into the lungs daily.     . Cyanocobalamin (VITAMIN B 12 PO) Take 1 tablet by mouth daily.    . cyclobenzaprine (FLEXERIL) 5 MG tablet Take 1 tablet (5 mg total) by mouth 3 (three) times daily as needed for muscle spasms. 42 tablet 2  . gabapentin (NEURONTIN) 100 MG capsule Take 1 capsule (100 mg total) by mouth 3 (three) times daily. (Patient taking differently: Take 100 mg by mouth daily. ) 90 capsule 2  . hydrALAZINE (APRESOLINE) 50 MG tablet Take 1 tablet (50 mg total)  by mouth every 8 (eight) hours. 90 tablet 1  . hydrochlorothiazide (HYDRODIURIL) 12.5 MG tablet Take 12.5 mg by mouth daily.     Marland Kitchen HYDROcodone-acetaminophen (NORCO) 10-325 MG tablet Take 1 tablet by mouth every 4 (four) hours as needed. 42 tablet 0  . hydrOXYzine (ATARAX/VISTARIL) 10 MG tablet Take 10 mg by mouth daily.     Marland Kitchen ibuprofen (ADVIL) 800 MG tablet Take 1 tablet (800 mg total) by mouth every 8 (eight) hours as needed. 90 tablet 0  . labetalol (NORMODYNE) 100 MG tablet Take 2 tablets (200 mg total) by mouth 2 (two) times daily. 120 tablet 1  . losartan (COZAAR) 100 MG tablet Take 1 tablet (100 mg total) by mouth daily. 30 tablet 1  . megestrol (MEGACE) 40 MG tablet Take 40 mg by mouth daily.    . metoCLOPramide (REGLAN) 10 MG tablet Take 10 mg by mouth daily before breakfast.     . ondansetron (ZOFRAN ODT) 4 MG disintegrating tablet Take 1 tablet (4 mg total) by mouth 3 (three) times daily. '4mg'$  ODT q4 hours prn nausea/vomit (Patient taking differently: Take 4 mg by mouth every  8 (eight) hours as needed for nausea or vomiting. ) 60 tablet 0  . oxyCODONE-acetaminophen (PERCOCET) 5-325 MG tablet Take 1 tablet by mouth every 4 (four) hours as needed for severe pain. 42 tablet 0  . pantoprazole (PROTONIX) 40 MG tablet Take 1 tablet (40 mg total) by mouth 2 (two) times daily before a meal. 60 tablet 0  . TRULANCE 3 MG TABS TAKE 1 TABLET BY MOUTH ONCE A DAY. (Patient taking differently: Take 3 mg by mouth daily. ) 30 tablet 5   No current facility-administered medications for this visit.     Allergies as of 03/01/2019 - Review Complete 03/01/2019  Allergen Reaction Noted  . Penicillins Rash     Family History  Problem Relation Age of Onset  . Colon cancer Brother 59          . Multiple myeloma Brother   . Liver cancer Sister   . Prostate cancer Brother   . Pancreatic cancer Brother   . Cancer Mother        breast  . Asthma Mother     Social History   Socioeconomic History  .  Marital status: Single    Spouse name: Not on file  . Number of children: 0  . Years of education: Not on file  . Highest education level: Not on file  Occupational History  . Occupation: umemployed   Social Needs  . Financial resource strain: Not on file  . Food insecurity    Worry: Not on file    Inability: Not on file  . Transportation needs    Medical: Not on file    Non-medical: Not on file  Tobacco Use  . Smoking status: Current Every Day Smoker    Packs/day: 1.00    Years: 40.00    Pack years: 40.00    Types: Cigarettes  . Smokeless tobacco: Never Used  . Tobacco comment: Smokes one pack of cigarettes daily  Substance and Sexual Activity  . Alcohol use: Not Currently    Alcohol/week: 1.0 standard drinks    Types: 1 Cans of beer per week    Comment: occas; denied 03/01/19  . Drug use: Not Currently    Types: Marijuana    Comment: history of marijuana in past- almost 2 years ago  . Sexual activity: Never    Birth control/protection: None  Lifestyle  . Physical activity    Days per week: Not on file    Minutes per session: Not on file  . Stress: Not on file  Relationships  . Social Herbalist on phone: Not on file    Gets together: Not on file    Attends religious service: Not on file    Active member of club or organization: Not on file    Attends meetings of clubs or organizations: Not on file    Relationship status: Not on file  Other Topics Concern  . Not on file  Social History Narrative  . Not on file    Review of Systems: Gen: Denies fever, chills, anorexia. Denies fatigue, weakness, weight loss.  CV: Denies chest pain, palpitations, syncope, peripheral edema, and claudication. Resp: Denies dyspnea at rest, cough, wheezing, coughing up blood, and pleurisy. GI: see HPI Derm: Denies rash, itching, dry skin Psych: Denies depression, anxiety, memory loss, confusion. No homicidal or suicidal ideation.  Heme: Denies bruising, bleeding, and  enlarged lymph nodes.  Physical Exam: BP (!) 148/89   Pulse 80   Temp 99 F (  37.2 C) (Oral)   Ht '5\' 6"'$  (1.676 m)   Wt 189 lb (85.7 kg)   BMI 30.51 kg/m  General:   Alert and oriented. No distress noted. Pleasant and cooperative.  Head:  Normocephalic and atraumatic. Eyes:  Conjuctiva clear without scleral icterus. Mouth:  Oral mucosa pink and moist.  Abdomen:  +BS, soft, non-tender and non-distended. No rebound or guarding. No HSM or masses noted. Msk:  Symmetrical without gross deformities. Normal posture. Extremities:  Without edema. Neurologic:  Alert and  oriented x4 Psych:  Alert and cooperative. Normal mood and affect.

## 2019-03-01 NOTE — Assessment & Plan Note (Signed)
MELD Na 6. Continues to be well-compensated. [place] in Sept 2020. Immune to Hep A. Unable to find Hep B vaccination status. EGD in 2021.   Hep B prescription provided.  Return in 6 months [place] in Sept 2020 Continue to abstain from ETOH. Applauded on this.

## 2019-03-04 DIAGNOSIS — J449 Chronic obstructive pulmonary disease, unspecified: Secondary | ICD-10-CM | POA: Diagnosis not present

## 2019-03-06 ENCOUNTER — Other Ambulatory Visit: Payer: Self-pay | Admitting: Orthopedic Surgery

## 2019-03-06 MED ORDER — HYDROCODONE-ACETAMINOPHEN 10-325 MG PO TABS: 1.0000 | ORAL_TABLET | ORAL | 0 refills | Status: DC | PRN

## 2019-03-06 NOTE — Telephone Encounter (Signed)
Patient requests refill: HYDROcodone-acetaminophen (NORCO) 10-325 MG tablet  - [facility]

## 2019-03-06 NOTE — Progress Notes (Signed)
cc'ed to pcp °

## 2019-03-13 ENCOUNTER — Other Ambulatory Visit: Payer: Self-pay | Admitting: Orthopedic Surgery

## 2019-03-13 MED ORDER — HYDROCODONE-ACETAMINOPHEN 7.5-325 MG PO TABS: 1.0000 | ORAL_TABLET | ORAL | 0 refills | Status: DC | PRN

## 2019-03-13 NOTE — Telephone Encounter (Signed)
Patient requests refill on Hydrocodone/Acetaminophen 10-325  Mgs.  Qty  42        Sig: Take 1 tablet by mouth every 4 (four) hours as needed.   Patient states she uses [facility]

## 2019-03-13 NOTE — Progress Notes (Signed)
Dosage reduction

## 2019-03-14 ENCOUNTER — Ambulatory Visit (INDEPENDENT_AMBULATORY_CARE_PROVIDER_SITE_OTHER): Payer: Medicare Other | Admitting: Orthopedic Surgery

## 2019-03-14 ENCOUNTER — Other Ambulatory Visit: Payer: Self-pay

## 2019-03-14 ENCOUNTER — Encounter: Payer: Self-pay | Admitting: Orthopedic Surgery

## 2019-03-14 DIAGNOSIS — Z9889 Other specified postprocedural states: Secondary | ICD-10-CM

## 2019-03-14 NOTE — Progress Notes (Signed)
Chief Complaint  Patient presents with  . Routine Post Op    01/30/19 right shoulder scope   62 6 weeks postop massive rotator cuff tear with repair sling for 6 weeks  Pain controlled with hydrocodone 7.5  Wound looks great  Start physical therapy  Follow-up 4 weeks  Encounter Diagnosis  Name Primary?  . S/P right rotator cuff repair 01/30/19 Yes

## 2019-03-16 ENCOUNTER — Other Ambulatory Visit: Payer: Self-pay

## 2019-03-16 ENCOUNTER — Encounter (HOSPITAL_COMMUNITY): Payer: Self-pay | Admitting: Occupational Therapy

## 2019-03-16 ENCOUNTER — Ambulatory Visit (HOSPITAL_COMMUNITY): Payer: Medicare Other | Attending: Orthopedic Surgery | Admitting: Occupational Therapy

## 2019-03-16 DIAGNOSIS — R29898 Other symptoms and signs involving the musculoskeletal system: Secondary | ICD-10-CM | POA: Insufficient documentation

## 2019-03-16 DIAGNOSIS — M25611 Stiffness of right shoulder, not elsewhere classified: Secondary | ICD-10-CM | POA: Diagnosis not present

## 2019-03-16 DIAGNOSIS — M25511 Pain in right shoulder: Secondary | ICD-10-CM | POA: Insufficient documentation

## 2019-03-16 NOTE — Therapy (Signed)
[facility] [address] [place], [place], [address] Phone: [phone]   Fax:  [phone]  Occupational Therapy Evaluation  Patient Details  Name: Colleen Lee MRN: [id number] Date of Birth: Jan 05, 1956 Referring Provider (OT): Dr. Arther Abbott   Encounter Date: 03/16/2019  OT End of Session - 03/16/19 1145    Visit Number  1    Number of Visits  16    Date for OT Re-Evaluation  05/15/19   mini-reassessment 04/14/2019   Authorization Type  1) [employer] Medicare 2) Medicaid    Authorization Time Period  visits based on medical necessity    OT Start Time  1110    OT Stop Time  1139    OT Time Calculation (min)  29 min    Activity Tolerance  Patient tolerated treatment well    Behavior During Therapy  [facility] for tasks assessed/performed       Past Medical History:  Diagnosis Date  . Asthma   . Chronic abdominal pain   . Chronic back pain   . Chronic constipation   . Cirrhosis ([place])    Metavir score F4 on elastography 2015  . Cyclical vomiting   . Fibroids   . Fibromyalgia   . GERD (gastroesophageal reflux disease)   . H. pylori infection 2014   treated with pylera, had to be treated again as it was not eradicated. Urea breath test then negative after subsequent treatment.   . [name]    HCV RNA positive 09/2012  . Hypertension   . Marijuana use   . Nausea and vomiting    chronic, recurrent  . PONV (postoperative nausea and vomiting)    pt thinks maybe once she had N&V  . Sciatica of left side     Past Surgical History:  Procedure Laterality Date  . BALLOON DILATION  12/20/2017   Procedure: BALLOON DILATION;  Surgeon: Danie Binder, MD;  [facility];  Service: Endoscopy;;  pyloric dilation  . BIOPSY  12/20/2017   Procedure: BIOPSY;  Surgeon: Danie Binder, MD;  [facility];  Service: Endoscopy;;  duodenal and gastric biopsy  . COLONOSCOPY  01/18/2008   ONG:EXBMWU rectum.  Long redundant  colon, a diminutive sigmoid polyp status post cold biopsy removed. Hyperplastic polyp. Repeat colonoscopy June 2014 due to family history of colon cancer  . COLONOSCOPY WITH ESOPHAGOGASTRODUODENOSCOPY (EGD) N/A 11/02/2012   XLK:GMWNUUVO gastric mucosa of doubtful, +H.pylori. Incomplete colonoscopy due to patient unable to tolerate exam, proximal colon seen. Patient refused ACBE.  Marland Kitchen COLONOSCOPY WITH PROPOFOL N/A 03/08/2013   Dr. Gala Romney: colonic polyp-removed as scribed above. Internal Hemorrhoids. Pathology did not reveal any colonic tissue, only mucus. SURVEILLANCE DUE Aug 2019  . COLONOSCOPY WITH PROPOFOL N/A 01/19/2018   Dr. Gala Romney: 6 mm cecal inflammatoy polyp, otherwise normal., Surveilance in 5 year s  . ESOPHAGEAL DILATION  12/20/2017   Procedure: ESOPHAGEAL DILATION;  Surgeon: Danie Binder, MD;  [facility];  Service: Endoscopy;;  . ESOPHAGOGASTRODUODENOSCOPY  01/18/2008   RMR: Normal esophagus, normal  stomach  . ESOPHAGOGASTRODUODENOSCOPY (EGD) WITH PROPOFOL N/A 02/09/2016   Normal esophagus, small hiatal hernia, portal hypertensive gastropathy, normal second portion of duodenum. Due in July 2019  . ESOPHAGOGASTRODUODENOSCOPY (EGD) WITH PROPOFOL N/A 12/20/2017   Dr. Oneida Alar: benign appearing esophageal stenosis s/p dilation, mild pyloric stenosis s/p biopsy and dilation, mild duodenitis.   Marland Kitchen HYSTEROSCOPY  05/11/2016   Procedure: HYSTEROSCOPY;  Surgeon: Jonnie Kind, MD;  Location: [facility];  Service: Gynecology;;  . KNEE SURGERY     left knee  . MULTIPLE EXTRACTIONS WITH ALVEOLOPLASTY N/A 10/15/2013   Procedure: MULTIPLE EXTRACTION WITH ALVEOLOPLASTY;  Surgeon: Gae Bon, DDS;  Location: [facility];  Service: Oral Surgery;  Laterality: N/A;  . POLYPECTOMY N/A 03/08/2013   Procedure: POLYPECTOMY;  Surgeon: Daneil Dolin, MD;  Location[facility];  Service: Endoscopy;  Laterality: N/A;  . POLYPECTOMY N/A 05/11/2016   Procedure: REMOVAL OF ENDOMETRIAL POLYP;  Surgeon: Jonnie Kind,  MD;  Location: [facility];  Service: Gynecology;  Laterality: N/A;  . RESECTION DISTAL CLAVICAL Right 01/30/2019   Procedure: RESECTION DISTAL CLAVICAL;  Surgeon: Carole Civil, MD;  Location: [facility];  Service: Orthopedics;  Laterality: Right;  . SHOULDER OPEN ROTATOR CUFF REPAIR Right 01/30/2019   Procedure: ROTATOR CUFF REPAIR SHOULDER OPEN;  Surgeon: Carole Civil, MD;  Location[facility];  Service: Orthopedics;  Laterality: Right;  pt to arrive at 7:30 for PICC at 8:00  . TOTAL KNEE ARTHROPLASTY Left 02/05/2015   Procedure: LEFT TOTAL KNEE ARTHROPLASTY;  Surgeon: Carole Civil, MD;  [facility];  Service: Orthopedics;  Laterality: Left;    There were no vitals filed for this visit.  Subjective Assessment - 03/16/19 1120    Subjective   S: I've been wearing my sling at home.    Pertinent History  Pt is a 63 y/o female s/p right open RCR on 01/30/2019. Pt presents without sling to evaluation, reports MD told her she didn't need it to drive so she left it at home. Pt was referred to occupational therapy for evaluation and treatment by Dr. Arther Abbott.    Special Tests  FOTO: 51/100    Patient Stated Goals  To be able to use my arm without pain.    Currently in Pain?  Yes    Pain Score  7     Pain Location  Shoulder    Pain Orientation  Right    Pain Descriptors / Indicators  Throbbing    Pain Type  Acute pain    Pain Radiating Towards  n/a    Pain Onset  More than a month ago    Pain Frequency  Intermittent    Aggravating Factors   movement    Pain Relieving Factors  rest, heat    Effect of Pain on Daily Activities  max effect on ADLs    Multiple Pain Sites  No        [facility] OT Assessment - 03/16/19 1106      Assessment   Medical Diagnosis  s/p Right Open RCR    Referring Provider (OT)  Dr. Arther Abbott    Onset Date/Surgical Date  01/30/19    Hand Dominance  Right    Next MD Visit  04/11/2019    Prior Therapy  None      Precautions   Precautions   Shoulder    Type of Shoulder Precautions  P/ROM x 4 weeks (8/14-9/11); AA/ROM and progress as tolerated unless MD states otherwise    Shoulder Interventions  Shoulder sling/immobilizer;At all times      Balance Screen   Has the patient fallen in the past 6 months  No    Has the patient had a decrease in activity level because of a fear of falling?   No    Is the patient reluctant to leave their home because of a fear of falling?   No  Prior Function   Level of Independence  Independent    Vocation  Retired    Leisure  gardening      ADL   ADL comments  Pt is having difficulty with dressing, bathing, grooming, housework tasks, reaching up and behind back. Pt is unable to sleep in the bed at this time.       Written Expression   Dominant Hand  Right      Cognition   Overall Cognitive Status  Within Functional Limits for tasks assessed      Observation/Other Assessments   Focus on Therapeutic Outcomes (FOTO)   51/100      ROM / Strength   AROM / PROM / Strength  AROM;PROM;Strength      Palpation   Palpation comment  Mod fascial restrictions along upper arm, trapezius, and medial scapular border      AROM   Overall AROM   Unable to assess;Due to precautions;Due to pain      PROM   Overall PROM Comments  Assessed supine, er/IR adducted    PROM Assessment Site  Shoulder    Right/Left Shoulder  Right    Right Shoulder Flexion  106 Degrees    Right Shoulder ABduction  78 Degrees    Right Shoulder Internal Rotation  90 Degrees    Right Shoulder External Rotation  45 Degrees      Strength   Overall Strength  Unable to assess;Due to precautions;Due to pain                      OT Education - 03/16/19 1128    Education Details  table slides    Person(s) Educated  Patient    Methods  Explanation;Demonstration;Handout    Comprehension  Verbalized understanding;Returned demonstration       OT Short Term Goals - 03/16/19 1148      OT SHORT TERM GOAL #1    Title  Patient will be educated and independent with HEP to faciliate progress in therapy and allow her to return to using her RUE as her dominant extremity.     Time  4    Period  Weeks    Status  New    Target Date  04/15/19      OT SHORT TERM GOAL #2   Title  Patient will increase RUE P/ROM to WNL in order to increase ability to complete dressing tasks with less difficulty.     Time  4    Period  Weeks    Status  New      OT SHORT TERM GOAL #3   Title  Patient will report a decrease in pain level of 5/10 to improve ability to sleep in the bed vs the couch.    Time  4    Period  Weeks    Status  New        OT Long Term Goals - 03/16/19 1150      OT LONG TERM GOAL #1   Title  Patient will increase her RUE A/ROM to WNL to improve ability to reach into overhead cabinets.    Time  8    Period  Weeks    Status  New    Target Date  05/15/19      OT LONG TERM GOAL #2   Title  Patient will increase her RUE strength to 4+/5 to improve ability to perform gardening tasks using RUE as dominant.    Time  8  Period  Weeks    Status  New      OT LONG TERM GOAL #3   Title  Patient will decrease her fascial restrictions to min amount or less in order to increase mobility required to complete functional reaching tasks.    Time  8    Period  Weeks    Status  New      OT LONG TERM GOAL #4   Title  Patient will report a decrease in pain of approximately 3/10 or less in her RUE while completing household tasks with her right arm.    Time  8    Period  Weeks    Status  New      OT LONG TERM GOAL #5   Title  Pt will return to highest level of functioning and independence using RUE as dominant.    Time  8    Period  Weeks    Status  New            Plan - 03/16/19 1145    Clinical Impression Statement  A: Pt is a 63 y/o female s/p right open RCR on 01/30/2019 presenting with functional limitations impeding pt's ability to perform ADLs using her RUE as dominant. Pt educated on  expected progression of therapy and on importance of following protocol to prevent injury. Pt verbalized understanding.    OT Occupational Profile and History  Problem Focused Assessment - Including review of records relating to presenting problem    Occupational performance deficits (Please refer to evaluation for details):  ADL's;IADL's;Rest and Sleep;Leisure    Body Structure / Function / Physical Skills  ADL;UE functional use;Fascial restriction;Pain;ROM;IADL;Strength    Rehab Potential  Good    Clinical Decision Making  Limited treatment options, no task modification necessary    Comorbidities Affecting Occupational Performance:  None    Modification or Assistance to Complete Evaluation   No modification of tasks or assist necessary to complete eval    OT Frequency  2x / week    OT Duration  8 weeks    OT Treatment/Interventions  Self-care/ADL training;Ultrasound;Patient/family education;Passive range of motion;Cryotherapy;Electrical Stimulation;Moist Heat;Therapeutic exercise;Manual Therapy;Therapeutic activities    Plan  P: Pt will benefit from skilled OT services to decrease pain and fascial restrictions, increase ROM, strength, and functional use of RUE as dominant. Treatment plan: myofascial release, manual therapy, P/ROM, AA/ROM, A/ROM, general RUE strengthening, scapular stability and strengthening, modalities prn    Consulted and Agree with Plan of Care  Patient       Patient will benefit from skilled therapeutic intervention in order to improve the following deficits and impairments:   Body Structure / Function / Physical Skills: ADL, UE functional use, Fascial restriction, Pain, ROM, IADL, Strength       Visit Diagnosis: 1. Acute pain of right shoulder   2. Stiffness of right shoulder, not elsewhere classified   3. Other symptoms and signs involving the musculoskeletal system       Problem List Patient Active Problem List   Diagnosis Date Noted  . S/P right rotator  cuff repair 01/30/19 02/06/2019  . Nontraumatic complete tear of right rotator cuff   . Arthritis of right acromioclavicular joint   . S/P total knee replacement, 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/24/2018  . Shoulder impingement, right 05/24/2018  . Dehydration   . Intractable cyclical vomiting 61/44/3154  . Pyloric stenosis in adult   . Thrombocytopenia ([place]) 12/20/2017  . Leukocytosis   . Malnutrition of moderate degree 12/17/2017  .  Acute renal failure (ARF) ([place]) 12/15/2017  . Chronic abdominal pain 12/15/2017  . Gastroenteritis 06/14/2016  . Nausea with vomiting 06/10/2016  . Uterine enlargement 06/09/2016  . Intractable nausea and vomiting 06/08/2016  . Acute infective gastroenteritis 06/08/2016  . Diarrhea 06/08/2016  . Essential hypertension 06/08/2016  . GERD (gastroesophageal reflux disease) 06/08/2016  . Abdominal pain   . Endometrial polyp 04/15/2016  . PMB (postmenopausal bleeding) 02/27/2016  . Alcoholic cirrhosis of liver without ascites ([place])   . Asthma 01/21/2016  . Hepatic cirrhosis ([place]) 09/22/2015  . Arthritis of knee, degenerative 02/05/2015  . History of Helicobacter pylori infection 10/30/2014  . Chronic [name] with cirrhosis ([place]) 04/11/2014  . De Quervain's disease (radial styloid tenosynovitis) 12/25/2013  . Anorexia 11/21/2012  . FH: colon cancer 11/21/2012  . Early satiety 10/25/2012  . Bowel habit changes 10/25/2012  . Abdominal pain, epigastric 10/25/2012  . Abdominal bloating 10/25/2012  . Constipation 10/25/2012  . Abnormal weight loss 10/25/2012  . Chronic viral [name] ([place]) 10/25/2012  . Radicular pain of left lower extremity 09/28/2012  . Back pain 09/28/2012  . Sciatica 08/10/2011  . S/P arthroscopy of left knee 08/10/2011  . Tibial plateau fracture 08/10/2011  . Pain in joint, lower leg 02/12/2011  . Stiffness of joint, not elsewhere classified, lower leg 02/12/2011  . 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 Meniscus, medial, derangement  12/29/2010  . CLOSED FRACTURE OF UPPER END OF TIBIA 08/12/2010   Guadelupe Sabin, OTR/L  [phone] 03/16/2019, 11:54 AM  [facility] [address] [place], [place], [address] Phone: [phone]   Fax:  [phone]  Name: Colleen Lee MRN: [id number] Date of Birth: October 24, 1955

## 2019-03-16 NOTE — Patient Instructions (Signed)
SHOULDER: Flexion On Table   Place hands on towel placed on table, elbows straight. Lean forward with you upper body, pushing towel away from body.  _15__ reps per set, __3_ sets per day  Abduction (Passive)   With arm out to side, resting on towel placed on table with palm DOWN, keeping trunk away from table, lean to the side while pushing towel away from body.  Repeat __15__ times. Do __3__ sessions per day.  Copyright  VHI. All rights reserved.     Internal Rotation (Assistive)   Seated with elbow bent at right angle and held against side, slide arm on table surface in an inward arc keeping elbow anchored in place. Repeat __15__ times. Do _3___ sessions per day. Activity: Use this motion to brush crumbs off the table.  Copyright  VHI. All rights reserved.    

## 2019-03-20 ENCOUNTER — Telehealth: Payer: Self-pay | Admitting: Orthopedic Surgery

## 2019-03-20 ENCOUNTER — Other Ambulatory Visit: Payer: Self-pay

## 2019-03-20 ENCOUNTER — Encounter (HOSPITAL_COMMUNITY): Payer: Self-pay | Admitting: Occupational Therapy

## 2019-03-20 ENCOUNTER — Ambulatory Visit (HOSPITAL_COMMUNITY): Payer: Medicare Other | Admitting: Occupational Therapy

## 2019-03-20 DIAGNOSIS — M25611 Stiffness of right shoulder, not elsewhere classified: Secondary | ICD-10-CM | POA: Diagnosis not present

## 2019-03-20 DIAGNOSIS — M25511 Pain in right shoulder: Secondary | ICD-10-CM | POA: Diagnosis not present

## 2019-03-20 DIAGNOSIS — R29898 Other symptoms and signs involving the musculoskeletal system: Secondary | ICD-10-CM

## 2019-03-20 MED ORDER — HYDROCODONE-ACETAMINOPHEN 7.5-325 MG PO TABS: 1.0000 | ORAL_TABLET | ORAL | 0 refills | Status: DC | PRN

## 2019-03-20 NOTE — Therapy (Signed)
[facility] [address] [place], [place], [address] Phone: [phone]   Fax:  [phone]  Occupational Therapy Treatment  Patient Details  Name: Colleen Lee MRN: [id number] Date of Birth: 1956/01/30 Referring Provider (OT): Dr. Arther Abbott   Encounter Date: 03/20/2019  OT End of Session - 03/20/19 1154    Visit Number  2    Number of Visits  16    Date for OT Re-Evaluation  05/15/19   mini-reassessment 04/14/2019   Authorization Type  1) UHC Medicare 2) Medicaid    Authorization Time Period  visits based on medical necessity    OT Start Time  1114    OT Stop Time  1153    OT Time Calculation (min)  39 min    Activity Tolerance  Patient tolerated treatment well    Behavior During Therapy  [facility] for tasks assessed/performed       Past Medical History:  Diagnosis Date  . Asthma   . Chronic abdominal pain   . Chronic back pain   . Chronic constipation   . Cirrhosis ([place])    Metavir score F4 on elastography 2015  . Cyclical vomiting   . Fibroids   . Fibromyalgia   . GERD (gastroesophageal reflux disease)   . H. pylori infection 2014   treated with pylera, had to be treated again as it was not eradicated. Urea breath test then negative after subsequent treatment.   . [name]    HCV RNA positive 09/2012  . Hypertension   . Marijuana use   . Nausea and vomiting    chronic, recurrent  . PONV (postoperative nausea and vomiting)    pt thinks maybe once she had N&V  . Sciatica of left side     Past Surgical History:  Procedure Laterality Date  . BALLOON DILATION  12/20/2017   Procedure: BALLOON DILATION;  Surgeon: Danie Binder, MD;  [facility];  Service: Endoscopy;;  pyloric dilation  . BIOPSY  12/20/2017   Procedure: BIOPSY;  Surgeon: Danie Binder, MD;  [facility];  Service: Endoscopy;;  duodenal and gastric biopsy  . COLONOSCOPY  01/18/2008   GOT:[id number] rectum.  Long redundant  colon, a diminutive sigmoid polyp status post cold biopsy removed. Hyperplastic polyp. Repeat colonoscopy June 2014 due to family history of colon cancer  . COLONOSCOPY WITH ESOPHAGOGASTRODUODENOSCOPY (EGD) N/A 11/02/2012   MBT:[id number] gastric mucosa of doubtful, +H.pylori. Incomplete colonoscopy due to patient unable to tolerate exam, proximal colon seen. Patient refused ACBE.  Marland Kitchen COLONOSCOPY WITH PROPOFOL N/A 03/08/2013   Dr. Gala Romney: colonic polyp-removed as scribed above. Internal Hemorrhoids. Pathology did not reveal any colonic tissue, only mucus. SURVEILLANCE DUE Aug 2019  . COLONOSCOPY WITH PROPOFOL N/A 01/19/2018   Dr. Gala Romney: 6 mm cecal inflammatoy polyp, otherwise normal., Surveilance in 5 year s  . ESOPHAGEAL DILATION  12/20/2017   Procedure: ESOPHAGEAL DILATION;  Surgeon: Danie Binder, MD;  [facility];  Service: Endoscopy;;  . ESOPHAGOGASTRODUODENOSCOPY  01/18/2008   RMR: Normal esophagus, normal  stomach  . ESOPHAGOGASTRODUODENOSCOPY (EGD) WITH PROPOFOL N/A 02/09/2016   Normal esophagus, small hiatal hernia, portal hypertensive gastropathy, normal second portion of duodenum. Due in July 2019  . ESOPHAGOGASTRODUODENOSCOPY (EGD) WITH PROPOFOL N/A 12/20/2017   Dr. Oneida Alar: benign appearing esophageal stenosis s/p dilation, mild pyloric stenosis s/p biopsy and dilation, mild duodenitis.   Marland Kitchen HYSTEROSCOPY  05/11/2016   Procedure: HYSTEROSCOPY;  Surgeon: Jonnie Kind, MD;  [facility]: [facility];  Service: Gynecology;;  . KNEE SURGERY     left knee  . MULTIPLE EXTRACTIONS WITH ALVEOLOPLASTY N/A 10/15/2013   Procedure: MULTIPLE EXTRACTION WITH ALVEOLOPLASTY;  Surgeon: Gae Bon, DDS;  Location: [facility];  Service: Oral Surgery;  Laterality: N/A;  . POLYPECTOMY N/A 03/08/2013   Procedure: POLYPECTOMY;  Surgeon: Daneil Dolin, MD;  [facility];  Service: Endoscopy;  Laterality: N/A;  . POLYPECTOMY N/A 05/11/2016   Procedure: REMOVAL OF ENDOMETRIAL POLYP;  Surgeon: Jonnie Kind,  MD;  [facility];  Service: Gynecology;  Laterality: N/A;  . RESECTION DISTAL CLAVICAL Right 01/30/2019   Procedure: RESECTION DISTAL CLAVICAL;  Surgeon: Carole Civil, MD;  [facility];  Service: Orthopedics;  Laterality: Right;  . SHOULDER OPEN ROTATOR CUFF REPAIR Right 01/30/2019   Procedure: ROTATOR CUFF REPAIR SHOULDER OPEN;  Surgeon: Carole Civil, MD;  [facility];  Service: Orthopedics;  Laterality: Right;  pt to arrive at 7:30 for PICC at 8:00  . TOTAL KNEE ARTHROPLASTY Left 02/05/2015   Procedure: LEFT TOTAL KNEE ARTHROPLASTY;  Surgeon: Carole Civil, MD;  [facility];  Service: Orthopedics;  Laterality: Left;    There were no vitals filed for this visit.  Subjective Assessment - 03/20/19 1114    Subjective   S: It's pretty sore today, the rain is bothering me.    Currently in Pain?  Yes    Pain Score  8     Pain Location  Shoulder    Pain Orientation  Right    Pain Descriptors / Indicators  Sore;Throbbing    Pain Type  Acute pain    Pain Radiating Towards  n/a    Pain Onset  More than a month ago    Pain Frequency  Intermittent    Aggravating Factors   movement    Pain Relieving Factors  rest, heat    Effect of Pain on Daily Activities  max effect on ADLs         [facility] OT Assessment - 03/20/19 1113      Assessment   Medical Diagnosis  s/p Right Open RCR      Precautions   Precautions  Shoulder    Type of Shoulder Precautions  P/ROM x 4 weeks (8/14-9/11); AA/ROM and progress as tolerated unless MD states otherwise    Shoulder Interventions  Shoulder sling/immobilizer;At all times               OT Treatments/Exercises (OP) - 03/20/19 1116      Exercises   Exercises  Shoulder      Shoulder Exercises: Supine   Protraction  PROM;10 reps    Horizontal ABduction  PROM;10 reps    External Rotation  PROM;10 reps    Internal Rotation  PROM;10 reps    Flexion  PROM;10 reps    ABduction  PROM;10 reps      Shoulder  Exercises: Seated   Elevation  AROM;10 reps    Extension  AROM;10 reps    Extension Limitations  mod facilitation from OT for correct completion    Row  AROM;10 reps    Other Seated Exercises  scapular depression, A/ROM, 10X      Shoulder Exercises: Therapy Ball   Flexion  10 reps    ABduction  10 reps      Shoulder Exercises: ROM/Strengthening   Prot/Ret//Elev/Dep  1' low level      Shoulder Exercises: Isometric Strengthening   Flexion  Supine;3X5"  Extension  Supine;3X5"    External Rotation  Supine;3X5"    Internal Rotation  Supine;3X5"    ABduction  Supine;3X5"    ADduction  Supine;3X5"      Manual Therapy   Manual Therapy  Myofascial release    Manual therapy comments  completed separately from therapeutic exercises    Myofascial Release  myofascial release and manual therapy complete to right upper arm, trapezius, and scapular regions to decrease fascial restrictions and improve pain and ROM               OT Short Term Goals - 03/20/19 1157      OT SHORT TERM GOAL #1   Title  Patient will be educated and independent with HEP to faciliate progress in therapy and allow her to return to using her RUE as her dominant extremity.     Time  4    Period  Weeks    Status  On-going    Target Date  04/15/19      OT SHORT TERM GOAL #2   Title  Patient will increase RUE P/ROM to WNL in order to increase ability to complete dressing tasks with less difficulty.     Time  4    Period  Weeks    Status  On-going      OT SHORT TERM GOAL #3   Title  Patient will report a decrease in pain level of 5/10 to improve ability to sleep in the bed vs the couch.    Time  4    Period  Weeks    Status  On-going        OT Long Term Goals - 03/20/19 1157      OT LONG TERM GOAL #1   Title  Patient will increase her RUE A/ROM to WNL to improve ability to reach into overhead cabinets.    Time  8    Period  Weeks    Status  On-going      OT LONG TERM GOAL #2   Title  Patient  will increase her RUE strength to 4+/5 to improve ability to perform gardening tasks using RUE as dominant.    Time  8    Period  Weeks    Status  On-going      OT LONG TERM GOAL #3   Title  Patient will decrease her fascial restrictions to min amount or less in order to increase mobility required to complete functional reaching tasks.    Time  8    Period  Weeks    Status  On-going      OT LONG TERM GOAL #4   Title  Patient will report a decrease in pain of approximately 3/10 or less in her RUE while completing household tasks with her right arm.    Time  8    Period  Weeks    Status  On-going      OT LONG TERM GOAL #5   Title  Pt will return to highest level of functioning and independence using RUE as dominant.    Time  8    Period  Weeks    Status  On-going            Plan - 03/20/19 1154    Clinical Impression Statement  A: Initiated myofascial release to decrease fascial restrictions limiting ROM, OT notes significant tightness in trapezius and scapular regions today. Initiated passive stretching, pt tolerating slightly above 50% ROM, also completed isometrics and  scapular A/ROM. Pt with max difficulty with shoulder blade retraction during extension, therefore also completed prot/ret/elev/dep at low level with good improvement. Verbal cuing for form and technique.    Body Structure / Function / Physical Skills  ADL;UE functional use;Fascial restriction;Pain;ROM;IADL;Strength    Plan  P: Continue working to decrease fascial restrictions, also work on improving ROM tolerance       Patient will benefit from skilled therapeutic intervention in order to improve the following deficits and impairments:   Body Structure / Function / Physical Skills: ADL, UE functional use, Fascial restriction, Pain, ROM, IADL, Strength       Visit Diagnosis: 1. Acute pain of right shoulder   2. Stiffness of right shoulder, not elsewhere classified   3. Other symptoms and signs involving  the musculoskeletal system       Problem List Patient Active Problem List   Diagnosis Date Noted  . S/P right rotator cuff repair 01/30/19 02/06/2019  . Nontraumatic complete tear of right rotator cuff   . Arthritis of right acromioclavicular joint   . S/P total knee replacement, 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/24/2018  . Shoulder impingement, right 05/24/2018  . Dehydration   . Intractable cyclical vomiting 23/55/7322  . Pyloric stenosis in adult   . Thrombocytopenia ([place]) 12/20/2017  . Leukocytosis   . Malnutrition of moderate degree 12/17/2017  . Acute renal failure (ARF) ([place]) 12/15/2017  . Chronic abdominal pain 12/15/2017  . Gastroenteritis 06/14/2016  . Nausea with vomiting 06/10/2016  . Uterine enlargement 06/09/2016  . Intractable nausea and vomiting 06/08/2016  . Acute infective gastroenteritis 06/08/2016  . Diarrhea 06/08/2016  . Essential hypertension 06/08/2016  . GERD (gastroesophageal reflux disease) 06/08/2016  . Abdominal pain   . Endometrial polyp 04/15/2016  . PMB (postmenopausal bleeding) 02/27/2016  . Alcoholic cirrhosis of liver without ascites ([place])   . Asthma 01/21/2016  . Hepatic cirrhosis ([place]) 09/22/2015  . Arthritis of knee, degenerative 02/05/2015  . History of Helicobacter pylori infection 10/30/2014  . Chronic [name] with cirrhosis ([place]) 04/11/2014  . De Quervain's disease (radial styloid tenosynovitis) 12/25/2013  . Anorexia 11/21/2012  . FH: colon cancer 11/21/2012  . Early satiety 10/25/2012  . Bowel habit changes 10/25/2012  . Abdominal pain, epigastric 10/25/2012  . Abdominal bloating 10/25/2012  . Constipation 10/25/2012  . Abnormal weight loss 10/25/2012  . Chronic viral [name] ([place]) 10/25/2012  . Radicular pain of left lower extremity 09/28/2012  . Back pain 09/28/2012  . Sciatica 08/10/2011  . S/P arthroscopy of left knee 08/10/2011  . Tibial plateau fracture 08/10/2011  . Pain in joint, lower leg 02/12/2011  . Stiffness of joint,  not elsewhere classified, lower leg 02/12/2011  . 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 Meniscus, medial, derangement 12/29/2010  . CLOSED FRACTURE OF UPPER END OF TIBIA 08/12/2010   Guadelupe Sabin, OTR/L  [phone] 03/20/2019, 11:58 AM  [facility] [address] [place], [place], [address] Phone: [phone]   Fax:  [phone]  Name: Colleen Lee MRN: [id number] Date of Birth: [birth year]

## 2019-03-20 NOTE — Telephone Encounter (Signed)
Patient of Dr. Ruthe Mannan request refill on Hydrocodone/Acetaminophen 7.5-325  Mgs.  Qty  42       Sig: Take 1 tablet by mouth every 4 (four) hours as needed for up to 7 days for moderate pain.   Patient states she uses [employer]

## 2019-03-22 ENCOUNTER — Encounter (HOSPITAL_COMMUNITY): Payer: Medicare Other | Admitting: Occupational Therapy

## 2019-03-23 ENCOUNTER — Ambulatory Visit (HOSPITAL_COMMUNITY): Payer: Medicare Other | Admitting: Specialist

## 2019-03-23 ENCOUNTER — Other Ambulatory Visit: Payer: Self-pay

## 2019-03-23 ENCOUNTER — Encounter (HOSPITAL_COMMUNITY): Payer: Self-pay | Admitting: Specialist

## 2019-03-23 DIAGNOSIS — M25611 Stiffness of right shoulder, not elsewhere classified: Secondary | ICD-10-CM | POA: Diagnosis not present

## 2019-03-23 DIAGNOSIS — M25511 Pain in right shoulder: Secondary | ICD-10-CM | POA: Diagnosis not present

## 2019-03-23 DIAGNOSIS — R29898 Other symptoms and signs involving the musculoskeletal system: Secondary | ICD-10-CM | POA: Diagnosis not present

## 2019-03-23 NOTE — Therapy (Addendum)
[facility] [address] [place], [place], [address] Phone: [phone]   Fax:  [phone]  Occupational Therapy Treatment  Patient Details  Name: Colleen Lee MRN: [id number] Date of Birth: 17-Apr-1956 Referring Provider (OT): Dr. Arther Abbott   Encounter Date: 03/23/2019  OT End of Session - 03/23/19 1449    Visit Number  3    Number of Visits  16    Date for OT Re-Evaluation  05/15/19   mini reassessment on 04/14/19   Authorization Type  1) UHC Medicare 2) Medicaid    Authorization Time Period  visits based on medical necessity    OT Start Time  1320    OT Stop Time  1359    OT Time Calculation (min)  39 min    Activity Tolerance  Patient tolerated treatment well    Behavior During Therapy  [facility] for tasks assessed/performed       Past Medical History:  Diagnosis Date  . Asthma   . Chronic abdominal pain   . Chronic back pain   . Chronic constipation   . Cirrhosis ([place])    Metavir score F4 on elastography 2015  . Cyclical vomiting   . Fibroids   . Fibromyalgia   . GERD (gastroesophageal reflux disease)   . H. pylori infection 2014   treated with pylera, had to be treated again as it was not eradicated. Urea breath test then negative after subsequent treatment.   . [name]    HCV RNA positive 09/2012  . Hypertension   . Marijuana use   . Nausea and vomiting    chronic, recurrent  . PONV (postoperative nausea and vomiting)    pt thinks maybe once she had N&V  . Sciatica of left side     Past Surgical History:  Procedure Laterality Date  . BALLOON DILATION  12/20/2017   Procedure: BALLOON DILATION;  Surgeon: Danie Binder, MD;  [facility];  Service: Endoscopy;;  pyloric dilation  . BIOPSY  12/20/2017   Procedure: BIOPSY;  Surgeon: Danie Binder, MD;  [facility];  Service: Endoscopy;;  duodenal and gastric biopsy  . COLONOSCOPY  01/18/2008   OEV:[id number] rectum.  Long redundant  colon, a diminutive sigmoid polyp status post cold biopsy removed. Hyperplastic polyp. Repeat colonoscopy June 2014 due to family history of colon cancer  . COLONOSCOPY WITH ESOPHAGOGASTRODUODENOSCOPY (EGD) N/A 11/02/2012   [id number] gastric mucosa of doubtful, +H.pylori. Incomplete colonoscopy due to patient unable to tolerate exam, proximal colon seen. Patient refused ACBE.  Marland Kitchen COLONOSCOPY WITH PROPOFOL N/A 03/08/2013   Dr. Gala Romney: colonic polyp-removed as scribed above. Internal Hemorrhoids. Pathology did not reveal any colonic tissue, only mucus. SURVEILLANCE DUE Aug 2019  . COLONOSCOPY WITH PROPOFOL N/A 01/19/2018   Dr. Gala Romney: 6 mm cecal inflammatoy polyp, otherwise normal., Surveilance in 5 year s  . ESOPHAGEAL DILATION  12/20/2017   Procedure: ESOPHAGEAL DILATION;  Surgeon: Danie Binder, MD;  [facility];  Service: Endoscopy;;  . ESOPHAGOGASTRODUODENOSCOPY  01/18/2008   RMR: Normal esophagus, normal  stomach  . ESOPHAGOGASTRODUODENOSCOPY (EGD) WITH PROPOFOL N/A 02/09/2016   Normal esophagus, small hiatal hernia, portal hypertensive gastropathy, normal second portion of duodenum. Due in July 2019  . ESOPHAGOGASTRODUODENOSCOPY (EGD) WITH PROPOFOL N/A 12/20/2017   Dr. Oneida Alar: benign appearing esophageal stenosis s/p dilation, mild pyloric stenosis s/p biopsy and dilation, mild duodenitis.   Marland Kitchen HYSTEROSCOPY  05/11/2016   Procedure: HYSTEROSCOPY;  Surgeon: Angelyn Punt  Glo Herring, MD;  [facility];  Service: Gynecology;;  . KNEE SURGERY     left knee  . MULTIPLE EXTRACTIONS WITH ALVEOLOPLASTY N/A 10/15/2013   Procedure: MULTIPLE EXTRACTION WITH ALVEOLOPLASTY;  Surgeon: Gae Bon, DDS;  Location: [facility];  Service: Oral Surgery;  Laterality: N/A;  . POLYPECTOMY N/A 03/08/2013   Procedure: POLYPECTOMY;  Surgeon: Daneil Dolin, MD;  [facility];  Service: Endoscopy;  Laterality: N/A;  . POLYPECTOMY N/A 05/11/2016   Procedure: REMOVAL OF ENDOMETRIAL POLYP;  Surgeon: Jonnie Kind,  MD;  [facility];  Service: Gynecology;  Laterality: N/A;  . RESECTION DISTAL CLAVICAL Right 01/30/2019   Procedure: RESECTION DISTAL CLAVICAL;  Surgeon: Carole Civil, MD;  [facility];  Service: Orthopedics;  Laterality: Right;  . SHOULDER OPEN ROTATOR CUFF REPAIR Right 01/30/2019   Procedure: ROTATOR CUFF REPAIR SHOULDER OPEN;  Surgeon: Carole Civil, MD;  [facility];  Service: Orthopedics;  Laterality: Right;  pt to arrive at 7:30 for PICC at 8:00  . TOTAL KNEE ARTHROPLASTY Left 02/05/2015   Procedure: LEFT TOTAL KNEE ARTHROPLASTY;  Surgeon: Carole Civil, MD;  [facility];  Service: Orthopedics;  Laterality: Left;    There were no vitals filed for this visit.  Subjective Assessment - 03/23/19 1449    Subjective   S:  the rain makes my arm ache    Currently in Pain?  Yes    Pain Score  6     Pain Location  Shoulder    Pain Orientation  Right;Lateral    Pain Descriptors / Indicators  Aching;Throbbing              03/27/19 0001  Exercises  Exercises Shoulder  Shoulder Exercises: Supine  Protraction PROM;10 reps  Horizontal ABduction PROM;10 reps  External Rotation PROM;10 reps  Internal Rotation PROM;10 reps  Flexion PROM;10 reps  ABduction PROM;10 reps  Other Supine Exercises bridges 10 X with min vg for technique and form   Shoulder Exercises: Seated  Elevation AROM;10 reps  Extension AROM;10 reps  Row AROM;10 reps  Shoulder Exercises: Therapy Ball  Flexion 10 reps  ABduction 10 reps  Shoulder Exercises: ROM/Strengthening  Thumb Tacks low 1'  Prot/Ret//Elev/Dep 1' low level (required max vg for technique )  Shoulder Exercises: Isometric Strengthening  Flexion Supine;3X5"  Extension Supine;3X5"  External Rotation Supine;3X5"  Internal Rotation Supine;3X5"  ABduction Supine;3X5"  ADduction Supine;3X5"  Manual Therapy  Manual Therapy Myofascial release  Manual therapy comments completed separately from therapeutic exercises   Myofascial Release myofascial release and manual therapy complete to right upper arm, trapezius, and scapular regions to decrease fascial restrictions and improve pain and ROM                    OT Short Term Goals - 03/20/19 1157      OT SHORT TERM GOAL #1   Title  Patient will be educated and independent with HEP to faciliate progress in therapy and allow her to return to using her RUE as her dominant extremity.     Time  4    Period  Weeks    Status  On-going    Target Date  04/15/19      OT SHORT TERM GOAL #2   Title  Patient will increase RUE P/ROM to WNL in order to increase ability to complete dressing tasks with less difficulty.     Time  4    Period  Weeks  Status  On-going      OT SHORT TERM GOAL #3   Title  Patient will report a decrease in pain level of 5/10 to improve ability to sleep in the bed vs the couch.    Time  4    Period  Weeks    Status  On-going        OT Long Term Goals - 03/20/19 1157      OT LONG TERM GOAL #1   Title  Patient will increase her RUE A/ROM to WNL to improve ability to reach into overhead cabinets.    Time  8    Period  Weeks    Status  On-going      OT LONG TERM GOAL #2   Title  Patient will increase her RUE strength to 4+/5 to improve ability to perform gardening tasks using RUE as dominant.    Time  8    Period  Weeks    Status  On-going      OT LONG TERM GOAL #3   Title  Patient will decrease her fascial restrictions to min amount or less in order to increase mobility required to complete functional reaching tasks.    Time  8    Period  Weeks    Status  On-going      OT LONG TERM GOAL #4   Title  Patient will report a decrease in pain of approximately 3/10 or less in her RUE while completing household tasks with her right arm.    Time  8    Period  Weeks    Status  On-going      OT LONG TERM GOAL #5   Title  Pt will return to highest level of functioning and independence using RUE as dominant.     Time  8    Period  Weeks    Status  On-going            Plan - 03/23/19 1450    Clinical Impression Statement  A:  patient guarded with p/rom, rebounding technique utilized to decrease guarding and allow for greater passive stretching.  continues to demonstrate difficulty with shoulder retraction/elevation alternating therefore therapist provided max tactile cues .    Body Structure / Function / Physical Skills  ADL;UE functional use;Fascial restriction;Pain;ROM;IADL;Strength    Plan  P:  continue to increase p/rom in pain free range, add gliding exercises, increase isometric contraction to 5 X 5".       Patient will benefit from skilled therapeutic intervention in order to improve the following deficits and impairments:   Body Structure / Function / Physical Skills: ADL, UE functional use, Fascial restriction, Pain, ROM, IADL, Strength       Visit Diagnosis: Acute pain of right shoulder  Stiffness of right shoulder, not elsewhere classified  Other symptoms and signs involving the musculoskeletal system    Problem List Patient Active Problem List   Diagnosis Date Noted  . S/P right rotator cuff repair 01/30/19 02/06/2019  . Nontraumatic complete tear of right rotator cuff   . Arthritis of right acromioclavicular joint   . S/P total knee replacement, 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/24/2018  . Shoulder impingement, right 05/24/2018  . Dehydration   . Intractable cyclical vomiting 26/33/3545  . Pyloric stenosis in adult   . Thrombocytopenia ([place]) 12/20/2017  . Leukocytosis   . Malnutrition of moderate degree 12/17/2017  . Acute renal failure (ARF) ([place]) 12/15/2017  . Chronic abdominal pain 12/15/2017  . Gastroenteritis 06/14/2016  .  Nausea with vomiting 06/10/2016  . Uterine enlargement 06/09/2016  . Intractable nausea and vomiting 06/08/2016  . Acute infective gastroenteritis 06/08/2016  . Diarrhea 06/08/2016  . Essential hypertension 06/08/2016  . GERD (gastroesophageal reflux  disease) 06/08/2016  . Abdominal pain   . Endometrial polyp 04/15/2016  . PMB (postmenopausal bleeding) 02/27/2016  . Alcoholic cirrhosis of liver without ascites ([place])   . Asthma 01/21/2016  . Hepatic cirrhosis ([place]) 09/22/2015  . Arthritis of knee, degenerative 02/05/2015  . History of Helicobacter pylori infection 10/30/2014  . Chronic [name] with cirrhosis ([place]) 04/11/2014  . De Quervain's disease (radial styloid tenosynovitis) 12/25/2013  . Anorexia 11/21/2012  . FH: colon cancer 11/21/2012  . Early satiety 10/25/2012  . Bowel habit changes 10/25/2012  . Abdominal pain, epigastric 10/25/2012  . Abdominal bloating 10/25/2012  . Constipation 10/25/2012  . Abnormal weight loss 10/25/2012  . Chronic viral [name] ([place]) 10/25/2012  . Radicular pain of left lower extremity 09/28/2012  . Back pain 09/28/2012  . Sciatica 08/10/2011  . S/P arthroscopy of left knee 08/10/2011  . Tibial plateau fracture 08/10/2011  . Pain in joint, lower leg 02/12/2011  . Stiffness of joint, not elsewhere classified, lower leg 02/12/2011  . 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 Meniscus, medial, derangement 12/29/2010  . CLOSED FRACTURE OF UPPER END OF TIBIA 08/12/2010    Vangie Bicker, [place], OTR/L [phone]  03/23/2019, 2:53 PM  [facility] [address] [place], [place], [address] Phone: [phone]   Fax:  [phone]  Name: Colleen Lee MRN: [id number] Date of Birth: 10-27-1955

## 2019-03-25 DIAGNOSIS — J449 Chronic obstructive pulmonary disease, unspecified: Secondary | ICD-10-CM | POA: Diagnosis not present

## 2019-03-25 DIAGNOSIS — I10 Essential (primary) hypertension: Secondary | ICD-10-CM | POA: Diagnosis not present

## 2019-03-27 ENCOUNTER — Other Ambulatory Visit: Payer: Self-pay

## 2019-03-27 ENCOUNTER — Ambulatory Visit (HOSPITAL_COMMUNITY): Payer: Medicare Other | Admitting: Occupational Therapy

## 2019-03-27 ENCOUNTER — Other Ambulatory Visit: Payer: Self-pay | Admitting: Orthopedic Surgery

## 2019-03-27 ENCOUNTER — Encounter (HOSPITAL_COMMUNITY): Payer: Self-pay | Admitting: Occupational Therapy

## 2019-03-27 DIAGNOSIS — R29898 Other symptoms and signs involving the musculoskeletal system: Secondary | ICD-10-CM | POA: Diagnosis not present

## 2019-03-27 DIAGNOSIS — M25511 Pain in right shoulder: Secondary | ICD-10-CM | POA: Diagnosis not present

## 2019-03-27 DIAGNOSIS — M25611 Stiffness of right shoulder, not elsewhere classified: Secondary | ICD-10-CM | POA: Diagnosis not present

## 2019-03-27 MED ORDER — HYDROCODONE-ACETAMINOPHEN 7.5-325 MG PO TABS: 1.0000 | ORAL_TABLET | ORAL | 0 refills | Status: DC | PRN

## 2019-03-27 NOTE — Telephone Encounter (Signed)
Patient requests refill on Hydrocodone/Acetaminophen 7.5-325  Mgs.  Qty  42  Sig: Take 1 tablet by mouth every 4 (four) hours as needed for up to 7 days for moderate pain.  Patient states she uses [employer]

## 2019-03-27 NOTE — Therapy (Signed)
[facility] [address] [place], [place], [address] Phone: [phone]   Fax:  [phone]  Occupational Therapy Treatment  Patient Details  Name: Colleen Lee MRN: [id number] Date of Birth: Jul 21, 1956 Referring Provider (OT): Dr. Arther Abbott   Encounter Date: 03/27/2019  OT End of Session - 03/27/19 1156    Visit Number  4    Number of Visits  16    Date for OT Re-Evaluation  05/15/19   mini reassessment on 04/14/19   Authorization Type  1) UHC Medicare 2) Medicaid    Authorization Time Period  visits based on medical necessity    OT Start Time  1115    OT Stop Time  1153    OT Time Calculation (min)  38 min    Activity Tolerance  Patient tolerated treatment well    Behavior During Therapy  [facility] for tasks assessed/performed       Past Medical History:  Diagnosis Date  . Asthma   . Chronic abdominal pain   . Chronic back pain   . Chronic constipation   . Cirrhosis ([place])    Metavir score F4 on elastography 2015  . Cyclical vomiting   . Fibroids   . Fibromyalgia   . GERD (gastroesophageal reflux disease)   . H. pylori infection 2014   treated with pylera, had to be treated again as it was not eradicated. Urea breath test then negative after subsequent treatment.   . [name]    HCV RNA positive 09/2012  . Hypertension   . Marijuana use   . Nausea and vomiting    chronic, recurrent  . PONV (postoperative nausea and vomiting)    pt thinks maybe once she had N&V  . Sciatica of left side     Past Surgical History:  Procedure Laterality Date  . BALLOON DILATION  12/20/2017   Procedure: BALLOON DILATION;  Surgeon: Danie Binder, MD;  [facility];  Service: Endoscopy;;  pyloric dilation  . BIOPSY  12/20/2017   Procedure: BIOPSY;  Surgeon: Danie Binder, MD;  [facility];  Service: Endoscopy;;  duodenal and gastric biopsy  . COLONOSCOPY  01/18/2008   ONG:EXBMWU rectum.  Long redundant  colon, a diminutive sigmoid polyp status post cold biopsy removed. Hyperplastic polyp. Repeat colonoscopy June 2014 due to family history of colon cancer  . COLONOSCOPY WITH ESOPHAGOGASTRODUODENOSCOPY (EGD) N/A 11/02/2012   XLK:GMWNUUVO gastric mucosa of doubtful, +H.pylori. Incomplete colonoscopy due to patient unable to tolerate exam, proximal colon seen. Patient refused ACBE.  Marland Kitchen COLONOSCOPY WITH PROPOFOL N/A 03/08/2013   Dr. Gala Romney: colonic polyp-removed as scribed above. Internal Hemorrhoids. Pathology did not reveal any colonic tissue, only mucus. SURVEILLANCE DUE Aug 2019  . COLONOSCOPY WITH PROPOFOL N/A 01/19/2018   Dr. Gala Romney: 6 mm cecal inflammatoy polyp, otherwise normal., Surveilance in 5 year s  . ESOPHAGEAL DILATION  12/20/2017   Procedure: ESOPHAGEAL DILATION;  Surgeon: Danie Binder, MD;  [facility];  Service: Endoscopy;;  . ESOPHAGOGASTRODUODENOSCOPY  01/18/2008   RMR: Normal esophagus, normal  stomach  . ESOPHAGOGASTRODUODENOSCOPY (EGD) WITH PROPOFOL N/A 02/09/2016   Normal esophagus, small hiatal hernia, portal hypertensive gastropathy, normal second portion of duodenum. Due in July 2019  . ESOPHAGOGASTRODUODENOSCOPY (EGD) WITH PROPOFOL N/A 12/20/2017   Dr. Oneida Alar: benign appearing esophageal stenosis s/p dilation, mild pyloric stenosis s/p biopsy and dilation, mild duodenitis.   Marland Kitchen HYSTEROSCOPY  05/11/2016   Procedure: HYSTEROSCOPY;  Surgeon: Angelyn Punt  Glo Herring, MD;  [facility]: [facility];  Service: Gynecology;;  . KNEE SURGERY     left knee  . MULTIPLE EXTRACTIONS WITH ALVEOLOPLASTY N/A 10/15/2013   Procedure: MULTIPLE EXTRACTION WITH ALVEOLOPLASTY;  Surgeon: Gae Bon, DDS;  Location: [place];  Service: Oral Surgery;  Laterality: N/A;  . POLYPECTOMY N/A 03/08/2013   Procedure: POLYPECTOMY;  Surgeon: Daneil Dolin, MD;  Location: [facility];  Service: Endoscopy;  Laterality: N/A;  . POLYPECTOMY N/A 05/11/2016   Procedure: REMOVAL OF ENDOMETRIAL POLYP;  Surgeon: Jonnie Kind,  MD;  Location: [facility];  Service: Gynecology;  Laterality: N/A;  . RESECTION DISTAL CLAVICAL Right 01/30/2019   Procedure: RESECTION DISTAL CLAVICAL;  Surgeon: Carole Civil, MD;  Location: [facility];  Service: Orthopedics;  Laterality: Right;  . SHOULDER OPEN ROTATOR CUFF REPAIR Right 01/30/2019   Procedure: ROTATOR CUFF REPAIR SHOULDER OPEN;  Surgeon: Carole Civil, MD;  [facility]: [facility];  Service: Orthopedics;  Laterality: Right;  pt to arrive at 7:30 for PICC at 8:00  . TOTAL KNEE ARTHROPLASTY Left 02/05/2015   Procedure: LEFT TOTAL KNEE ARTHROPLASTY;  Surgeon: Carole Civil, MD;  [facility];  Service: Orthopedics;  Laterality: Left;    There were no vitals filed for this visit.  Subjective Assessment - 03/27/19 1111    Subjective   S: They're going ok. (exercises)    Currently in Pain?  Yes    Pain Score  7     Pain Location  Shoulder    Pain Orientation  Right    Pain Descriptors / Indicators  Aching;Throbbing    Pain Type  Acute pain    Pain Radiating Towards  n/a    Pain Onset  More than a month ago    Pain Frequency  Intermittent    Aggravating Factors   movement    Pain Relieving Factors  rest, heat    Effect of Pain on Daily Activities  max effect on ADLs    Multiple Pain Sites  No         [facility] OT Assessment - 03/27/19 1110      Assessment   Medical Diagnosis  s/p Right Open RCR      Precautions   Precautions  Shoulder    Type of Shoulder Precautions  P/ROM x 4 weeks (8/14-9/11); AA/ROM and progress as tolerated unless MD states otherwise    Shoulder Interventions  Shoulder sling/immobilizer;At all times               OT Treatments/Exercises (OP) - 03/27/19 1110      Exercises   Exercises  Shoulder      Shoulder Exercises: Supine   Protraction  PROM;10 reps    Horizontal ABduction  PROM;10 reps    External Rotation  PROM;10 reps    Internal Rotation  PROM;10 reps    Flexion  PROM;10 reps    ABduction  PROM;10 reps    Other  Supine Exercises  bridges 10 X with min vg for technique and form       Shoulder Exercises: Seated   Elevation  AROM;12 reps    Extension  AROM;12 reps    Row  AROM;12 reps    Other Seated Exercises  scapular depression, A/ROM, 12X      Shoulder Exercises: Therapy Ball   Flexion  --   12 reps   ABduction  --   12 reps     Shoulder Exercises: ROM/Strengthening   Anterior Glide  3x10"  Caudal Glide  3x10"      Shoulder Exercises: Isometric Strengthening   Flexion  Supine;5X5"    Extension  Supine;5X5"    External Rotation  Supine;5X5"    Internal Rotation  Supine;5X5"    ABduction  Supine;5X5"    ADduction  Supine;5X5"      Manual Therapy   Manual Therapy  Myofascial release    Manual therapy comments  completed separately from therapeutic exercises    Myofascial Release  myofascial release and manual therapy complete to right upper arm, trapezius, and scapular regions to decrease fascial restrictions and improve pain and ROM             OT Education - 03/27/19 1150    Education Details  educated on self-myofascial release using tennis ball at anterior shoulder and trapezius regions    Person(s) Educated  Patient    Methods  Explanation;Demonstration    Comprehension  Verbalized understanding;Returned demonstration       OT Short Term Goals - 03/20/19 1157      OT SHORT TERM GOAL #1   Title  Patient will be educated and independent with HEP to faciliate progress in therapy and allow her to return to using her RUE as her dominant extremity.     Time  4    Period  Weeks    Status  On-going    Target Date  04/15/19      OT SHORT TERM GOAL #2   Title  Patient will increase RUE P/ROM to WNL in order to increase ability to complete dressing tasks with less difficulty.     Time  4    Period  Weeks    Status  On-going      OT SHORT TERM GOAL #3   Title  Patient will report a decrease in pain level of 5/10 to improve ability to sleep in the bed vs the couch.     Time  4    Period  Weeks    Status  On-going        OT Long Term Goals - 03/20/19 1157      OT LONG TERM GOAL #1   Title  Patient will increase her RUE A/ROM to WNL to improve ability to reach into overhead cabinets.    Time  8    Period  Weeks    Status  On-going      OT LONG TERM GOAL #2   Title  Patient will increase her RUE strength to 4+/5 to improve ability to perform gardening tasks using RUE as dominant.    Time  8    Period  Weeks    Status  On-going      OT LONG TERM GOAL #3   Title  Patient will decrease her fascial restrictions to min amount or less in order to increase mobility required to complete functional reaching tasks.    Time  8    Period  Weeks    Status  On-going      OT LONG TERM GOAL #4   Title  Patient will report a decrease in pain of approximately 3/10 or less in her RUE while completing household tasks with her right arm.    Time  8    Period  Weeks    Status  On-going      OT LONG TERM GOAL #5   Title  Pt will return to highest level of functioning and independence using RUE as dominant.  Time  8    Period  Weeks    Status  On-going            Plan - 03/27/19 1143    Clinical Impression Statement  A: Continued with manual therapy addressing fascial restrictions in upper arm and trapezius regions. Pt able to tolerate P/ROM [facility] today, increased isometrics to 5x5" with min fatigue at end. Increased scapular A/ROM to 12 reps, added anterior and caudle glides today.Verbal cuing for form and technique.    Body Structure / Function / Physical Skills  ADL;UE functional use;Fascial restriction;Pain;ROM;IADL;Strength    Plan  P: continue working to improve ROM during passive stretching, resume thumb tacks and prot/ret/elev/dep       Patient will benefit from skilled therapeutic intervention in order to improve the following deficits and impairments:   Body Structure / Function / Physical Skills: ADL, UE functional use, Fascial restriction,  Pain, ROM, IADL, Strength       Visit Diagnosis: Acute pain of right shoulder  Stiffness of right shoulder, not elsewhere classified  Other symptoms and signs involving the musculoskeletal system    Problem List Patient Active Problem List   Diagnosis Date Noted  . S/P right rotator cuff repair 01/30/19 02/06/2019  . Nontraumatic complete tear of right rotator cuff   . Arthritis of right acromioclavicular joint   . S/P total knee replacement, 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/24/2018  . Shoulder impingement, right 05/24/2018  . Dehydration   . Intractable cyclical vomiting 97/98/9211  . Pyloric stenosis in adult   . Thrombocytopenia ([place]) 12/20/2017  . Leukocytosis   . Malnutrition of moderate degree 12/17/2017  . Acute renal failure (ARF) ([place]) 12/15/2017  . Chronic abdominal pain 12/15/2017  . Gastroenteritis 06/14/2016  . Nausea with vomiting 06/10/2016  . Uterine enlargement 06/09/2016  . Intractable nausea and vomiting 06/08/2016  . Acute infective gastroenteritis 06/08/2016  . Diarrhea 06/08/2016  . Essential hypertension 06/08/2016  . GERD (gastroesophageal reflux disease) 06/08/2016  . Abdominal pain   . Endometrial polyp 04/15/2016  . PMB (postmenopausal bleeding) 02/27/2016  . Alcoholic cirrhosis of liver without ascites ([place])   . Asthma 01/21/2016  . Hepatic cirrhosis ([place]) 09/22/2015  . Arthritis of knee, degenerative 02/05/2015  . History of Helicobacter pylori infection 10/30/2014  . Chronic [name] with cirrhosis ([place]) 04/11/2014  . De Quervain's disease (radial styloid tenosynovitis) 12/25/2013  . Anorexia 11/21/2012  . FH: colon cancer 11/21/2012  . Early satiety 10/25/2012  . Bowel habit changes 10/25/2012  . Abdominal pain, epigastric 10/25/2012  . Abdominal bloating 10/25/2012  . Constipation 10/25/2012  . Abnormal weight loss 10/25/2012  . Chronic viral [name] ([place]) 10/25/2012  . Radicular pain of left lower extremity 09/28/2012  . Back pain  09/28/2012  . Sciatica 08/10/2011  . S/P arthroscopy of left knee 08/10/2011  . Tibial plateau fracture 08/10/2011  . Pain in joint, lower leg 02/12/2011  . Stiffness of joint, not elsewhere classified, lower leg 02/12/2011  . 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 Meniscus, medial, derangement 12/29/2010  . CLOSED FRACTURE OF UPPER END OF TIBIA 08/12/2010   Guadelupe Sabin, OTR/L  [phone] 03/27/2019, 11:57 AM  [facility] [place], [place], [address] Phone: [phone]   Fax:  [phone]  Name: Colleen Lee MRN: [id number] Date of Birth: Jan 09, 1956

## 2019-03-29 ENCOUNTER — Encounter (HOSPITAL_COMMUNITY): Payer: Self-pay | Admitting: Occupational Therapy

## 2019-03-29 ENCOUNTER — Other Ambulatory Visit: Payer: Self-pay

## 2019-03-29 ENCOUNTER — Ambulatory Visit (HOSPITAL_COMMUNITY): Payer: Medicare Other | Admitting: Occupational Therapy

## 2019-03-29 DIAGNOSIS — R29898 Other symptoms and signs involving the musculoskeletal system: Secondary | ICD-10-CM | POA: Diagnosis not present

## 2019-03-29 DIAGNOSIS — M25611 Stiffness of right shoulder, not elsewhere classified: Secondary | ICD-10-CM | POA: Diagnosis not present

## 2019-03-29 DIAGNOSIS — M25511 Pain in right shoulder: Secondary | ICD-10-CM | POA: Diagnosis not present

## 2019-03-29 NOTE — Therapy (Signed)
[facility] [address] [place], [place], [address] Phone: [phone]   Fax:  [phone]  Occupational Therapy Treatment  Patient Details  Name: Colleen Lee MRN: [id number] Date of Birth: [birth year] Referring Provider (OT): Dr. Arther Abbott   Encounter Date: 03/29/2019  OT End of Session - 03/29/19 1144    Visit Number  5    Number of Visits  16    Date for OT Re-Evaluation  05/15/19   mini reassessment on 04/14/19   Authorization Type  1) UHC Medicare 2) Medicaid    Authorization Time Period  visits based on medical necessity    OT Start Time  1105    OT Stop Time  1145    OT Time Calculation (min)  40 min    Activity Tolerance  Patient tolerated treatment well    Behavior During Therapy  [facility] for tasks assessed/performed       Past Medical History:  Diagnosis Date  . Asthma   . Chronic abdominal pain   . Chronic back pain   . Chronic constipation   . Cirrhosis ([place])    Metavir score F4 on elastography 2015  . Cyclical vomiting   . Fibroids   . Fibromyalgia   . GERD (gastroesophageal reflux disease)   . H. pylori infection 2014   treated with pylera, had to be treated again as it was not eradicated. Urea breath test then negative after subsequent treatment.   . [name]    HCV RNA positive 09/2012  . Hypertension   . Marijuana use   . Nausea and vomiting    chronic, recurrent  . PONV (postoperative nausea and vomiting)    pt thinks maybe once she had N&V  . Sciatica of left side     Past Surgical History:  Procedure Laterality Date  . BALLOON DILATION  12/20/2017   Procedure: BALLOON DILATION;  Surgeon: Danie Binder, MD;  [facility];  Service: Endoscopy;;  pyloric dilation  . BIOPSY  12/20/2017   Procedure: BIOPSY;  Surgeon: Danie Binder, MD;  [facility];  Service: Endoscopy;;  duodenal and gastric biopsy  . COLONOSCOPY  01/18/2008   KWI:OXBDZH rectum.  Long redundant  colon, a diminutive sigmoid polyp status post cold biopsy removed. Hyperplastic polyp. Repeat colonoscopy June 2014 due to family history of colon cancer  . COLONOSCOPY WITH ESOPHAGOGASTRODUODENOSCOPY (EGD) N/A 11/02/2012   GDJ:MEQASTMH gastric mucosa of doubtful, +H.pylori. Incomplete colonoscopy due to patient unable to tolerate exam, proximal colon seen. Patient refused ACBE.  Marland Kitchen COLONOSCOPY WITH PROPOFOL N/A 03/08/2013   Dr. Gala Romney: colonic polyp-removed as scribed above. Internal Hemorrhoids. Pathology did not reveal any colonic tissue, only mucus. SURVEILLANCE DUE Aug 2019  . COLONOSCOPY WITH PROPOFOL N/A 01/19/2018   Dr. Gala Romney: 6 mm cecal inflammatoy polyp, otherwise normal., Surveilance in 5 year s  . ESOPHAGEAL DILATION  12/20/2017   Procedure: ESOPHAGEAL DILATION;  Surgeon: Danie Binder, MD;  [facility];  Service: Endoscopy;;  . ESOPHAGOGASTRODUODENOSCOPY  01/18/2008   RMR: Normal esophagus, normal  stomach  . ESOPHAGOGASTRODUODENOSCOPY (EGD) WITH PROPOFOL N/A 02/09/2016   Normal esophagus, small hiatal hernia, portal hypertensive gastropathy, normal second portion of duodenum. Due in July 2019  . ESOPHAGOGASTRODUODENOSCOPY (EGD) WITH PROPOFOL N/A 12/20/2017   Dr. Oneida Alar: benign appearing esophageal stenosis s/p dilation, mild pyloric stenosis s/p biopsy and dilation, mild duodenitis.   Marland Kitchen HYSTEROSCOPY  05/11/2016   Procedure: HYSTEROSCOPY;  Surgeon: Angelyn Punt  Glo Herring, MD;  [facility];  Service: Gynecology;;  . KNEE SURGERY     left knee  . MULTIPLE EXTRACTIONS WITH ALVEOLOPLASTY N/A 10/15/2013   Procedure: MULTIPLE EXTRACTION WITH ALVEOLOPLASTY;  Surgeon: Gae Bon, DDS;  Location: [place];  Service: Oral Surgery;  Laterality: N/A;  . POLYPECTOMY N/A 03/08/2013   Procedure: POLYPECTOMY;  Surgeon: Daneil Dolin, MD;  [facility];  Service: Endoscopy;  Laterality: N/A;  . POLYPECTOMY N/A 05/11/2016   Procedure: REMOVAL OF ENDOMETRIAL POLYP;  Surgeon: Jonnie Kind,  MD;  [facility];  Service: Gynecology;  Laterality: N/A;  . RESECTION DISTAL CLAVICAL Right 01/30/2019   Procedure: RESECTION DISTAL CLAVICAL;  Surgeon: Carole Civil, MD;  [facility]: [facility];  Service: Orthopedics;  Laterality: Right;  . SHOULDER OPEN ROTATOR CUFF REPAIR Right 01/30/2019   Procedure: ROTATOR CUFF REPAIR SHOULDER OPEN;  Surgeon: Carole Civil, MD;  [facility];  Service: Orthopedics;  Laterality: Right;  pt to arrive at 7:30 for PICC at 8:00  . TOTAL KNEE ARTHROPLASTY Left 02/05/2015   Procedure: LEFT TOTAL KNEE ARTHROPLASTY;  Surgeon: Carole Civil, MD;  [facility];  Service: Orthopedics;  Laterality: Left;    There were no vitals filed for this visit.  Subjective Assessment - 03/29/19 1103    Subjective   S: It's a little sore today, it stays that way.    Currently in Pain?  Yes    Pain Score  5     Pain Location  Shoulder    Pain Orientation  Right    Pain Descriptors / Indicators  Sore    Pain Type  Acute pain    Pain Radiating Towards  n/a    Pain Onset  More than a month ago    Pain Frequency  Intermittent    Aggravating Factors   movement    Pain Relieving Factors  rest, heat    Effect of Pain on Daily Activities  max effect on ADLs    Multiple Pain Sites  No         [facility] OT Assessment - 03/29/19 1103      Assessment   Medical Diagnosis  s/p Right Open RCR      Precautions   Precautions  Shoulder    Type of Shoulder Precautions  P/ROM x 4 weeks (8/14-9/11); AA/ROM and progress as tolerated unless MD states otherwise    Shoulder Interventions  Shoulder sling/immobilizer;At all times               OT Treatments/Exercises (OP) - 03/29/19 1107      Exercises   Exercises  Shoulder      Shoulder Exercises: Supine   Protraction  PROM;10 reps    Horizontal ABduction  PROM;10 reps    External Rotation  PROM;10 reps    Internal Rotation  PROM;10 reps    Flexion  PROM;10 reps    ABduction  PROM;10 reps    Other  Supine Exercises  bridges 12X      Shoulder Exercises: Seated   Elevation  AROM;12 reps    Extension  AROM;12 reps    Row  AROM;12 reps    Other Seated Exercises  scapular depression, A/ROM, 12X      Shoulder Exercises: Therapy Ball   Flexion  --   12 reps   ABduction  --   12 reps     Shoulder Exercises: ROM/Strengthening   Thumb Tacks  1' low level  Anterior Glide  3x10"    Caudal Glide  3x10"    Prot/Ret//Elev/Dep  1' low level      Shoulder Exercises: Isometric Strengthening   Flexion  Supine;5X5"    Extension  Supine;5X5"    External Rotation  Supine;5X5"    Internal Rotation  Supine;5X5"    ABduction  Supine;5X5"    ADduction  Supine;5X5"      Manual Therapy   Manual Therapy  Myofascial release    Manual therapy comments  completed separately from therapeutic exercises    Myofascial Release  myofascial release and manual therapy complete to right upper arm, trapezius, and scapular regions to decrease fascial restrictions and improve pain and ROM               OT Short Term Goals - 03/20/19 1157      OT SHORT TERM GOAL #1   Title  Patient will be educated and independent with HEP to faciliate progress in therapy and allow her to return to using her RUE as her dominant extremity.     Time  4    Period  Weeks    Status  On-going    Target Date  04/15/19      OT SHORT TERM GOAL #2   Title  Patient will increase RUE P/ROM to WNL in order to increase ability to complete dressing tasks with less difficulty.     Time  4    Period  Weeks    Status  On-going      OT SHORT TERM GOAL #3   Title  Patient will report a decrease in pain level of 5/10 to improve ability to sleep in the bed vs the couch.    Time  4    Period  Weeks    Status  On-going        OT Long Term Goals - 03/20/19 1157      OT LONG TERM GOAL #1   Title  Patient will increase her RUE A/ROM to WNL to improve ability to reach into overhead cabinets.    Time  8    Period  Weeks     Status  On-going      OT LONG TERM GOAL #2   Title  Patient will increase her RUE strength to 4+/5 to improve ability to perform gardening tasks using RUE as dominant.    Time  8    Period  Weeks    Status  On-going      OT LONG TERM GOAL #3   Title  Patient will decrease her fascial restrictions to min amount or less in order to increase mobility required to complete functional reaching tasks.    Time  8    Period  Weeks    Status  On-going      OT LONG TERM GOAL #4   Title  Patient will report a decrease in pain of approximately 3/10 or less in her RUE while completing household tasks with her right arm.    Time  8    Period  Weeks    Status  On-going      OT LONG TERM GOAL #5   Title  Pt will return to highest level of functioning and independence using RUE as dominant.    Time  8    Period  Weeks    Status  On-going            Plan - 03/29/19 1142    Clinical  Impression Statement  A: Continued with manual therapy to address restrictions in upper arm, trapezius, and anterior shoulder regions. Pt continues to improve with P/ROM tolerance, er WNL today. Continued with isometrics and resumed thumb tacks and prot/ret/elev/dep, pt able to complete prot/ret/elev/dep with only initial cuing for correct technique. Verbal cuing for form during session today.    Body Structure / Function / Physical Skills  ADL;UE functional use;Fascial restriction;Pain;ROM;IADL;Strength    Plan  P: continue working on P/ROM tolerance, begin isometrics in standing       Patient will benefit from skilled therapeutic intervention in order to improve the following deficits and impairments:   Body Structure / Function / Physical Skills: ADL, UE functional use, Fascial restriction, Pain, ROM, IADL, Strength       Visit Diagnosis: Acute pain of right shoulder  Stiffness of right shoulder, not elsewhere classified  Other symptoms and signs involving the musculoskeletal system    Problem  List Patient Active Problem List   Diagnosis Date Noted  . S/P right rotator cuff repair 01/30/19 02/06/2019  . Nontraumatic complete tear of right rotator cuff   . Arthritis of right acromioclavicular joint   . S/P total knee replacement, 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/24/2018  . Shoulder impingement, right 05/24/2018  . Dehydration   . Intractable cyclical vomiting 75/64/3329  . Pyloric stenosis in adult   . Thrombocytopenia ([place]) 12/20/2017  . Leukocytosis   . Malnutrition of moderate degree 12/17/2017  . Acute renal failure (ARF) ([place]) 12/15/2017  . Chronic abdominal pain 12/15/2017  . Gastroenteritis 06/14/2016  . Nausea with vomiting 06/10/2016  . Uterine enlargement 06/09/2016  . Intractable nausea and vomiting 06/08/2016  . Acute infective gastroenteritis 06/08/2016  . Diarrhea 06/08/2016  . Essential hypertension 06/08/2016  . GERD (gastroesophageal reflux disease) 06/08/2016  . Abdominal pain   . Endometrial polyp 04/15/2016  . PMB (postmenopausal bleeding) 02/27/2016  . Alcoholic cirrhosis of liver without ascites ([place])   . Asthma 01/21/2016  . Hepatic cirrhosis ([place]) 09/22/2015  . Arthritis of knee, degenerative 02/05/2015  . History of Helicobacter pylori infection 10/30/2014  . Chronic [name] with cirrhosis ([place]) 04/11/2014  . De Quervain's disease (radial styloid tenosynovitis) 12/25/2013  . Anorexia 11/21/2012  . FH: colon cancer 11/21/2012  . Early satiety 10/25/2012  . Bowel habit changes 10/25/2012  . Abdominal pain, epigastric 10/25/2012  . Abdominal bloating 10/25/2012  . Constipation 10/25/2012  . Abnormal weight loss 10/25/2012  . Chronic viral [name] ([place]) 10/25/2012  . Radicular pain of left lower extremity 09/28/2012  . Back pain 09/28/2012  . Sciatica 08/10/2011  . S/P arthroscopy of left knee 08/10/2011  . Tibial plateau fracture 08/10/2011  . Pain in joint, lower leg 02/12/2011  . Stiffness of joint, not elsewhere classified, lower leg 02/12/2011   . 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 Meniscus, medial, derangement 12/29/2010  . CLOSED FRACTURE OF UPPER END OF TIBIA 08/12/2010   Guadelupe Sabin, OTR/L  [phone] 03/29/2019, 11:45 AM  [facility] [address] [place], [place], [address] Phone: [phone]   Fax:  [phone]  Name: Colleen Lee MRN: [id number] Date of Birth: [birth year]

## 2019-04-03 ENCOUNTER — Ambulatory Visit (HOSPITAL_COMMUNITY): Payer: Medicare Other | Attending: Orthopedic Surgery | Admitting: Occupational Therapy

## 2019-04-03 ENCOUNTER — Encounter (HOSPITAL_COMMUNITY): Payer: Self-pay | Admitting: Occupational Therapy

## 2019-04-03 ENCOUNTER — Other Ambulatory Visit: Payer: Self-pay | Admitting: Radiology

## 2019-04-03 ENCOUNTER — Other Ambulatory Visit: Payer: Self-pay

## 2019-04-03 DIAGNOSIS — R29898 Other symptoms and signs involving the musculoskeletal system: Secondary | ICD-10-CM | POA: Insufficient documentation

## 2019-04-03 DIAGNOSIS — M25611 Stiffness of right shoulder, not elsewhere classified: Secondary | ICD-10-CM | POA: Insufficient documentation

## 2019-04-03 DIAGNOSIS — M25511 Pain in right shoulder: Secondary | ICD-10-CM | POA: Diagnosis not present

## 2019-04-03 MED ORDER — HYDROCODONE-ACETAMINOPHEN 7.5-325 MG PO TABS: 1.0000 | ORAL_TABLET | ORAL | 0 refills | Status: DC | PRN

## 2019-04-03 NOTE — Telephone Encounter (Signed)
Pt calls, requests refill pain meds.   [employer]

## 2019-04-03 NOTE — Therapy (Signed)
[facility] [address] [place], [place], [address] Phone: [phone]   Fax:  [phone]  Occupational Therapy Treatment  Patient Details  Name: Colleen Lee MRN: [id number] Date of Birth: [birth year] Referring Provider (OT): Dr. Arther Abbott   Encounter Date: 04/03/2019  OT End of Session - 04/03/19 1342    Visit Number  6    Number of Visits  16    Date for OT Re-Evaluation  05/15/19   mini reassessment on 04/14/19   Authorization Type  1) UHC Medicare 2) Medicaid    Authorization Time Period  visits based on medical necessity    OT Start Time  1300    OT Stop Time  1341    OT Time Calculation (min)  41 min    Activity Tolerance  Patient tolerated treatment well    Behavior During Therapy  [facility] for tasks assessed/performed       Past Medical History:  Diagnosis Date  . Asthma   . Chronic abdominal pain   . Chronic back pain   . Chronic constipation   . Cirrhosis ([place])    Metavir score F4 on elastography 2015  . Cyclical vomiting   . Fibroids   . Fibromyalgia   . GERD (gastroesophageal reflux disease)   . H. pylori infection 2014   treated with pylera, had to be treated again as it was not eradicated. Urea breath test then negative after subsequent treatment.   . [name]    HCV RNA positive 09/2012  . Hypertension   . Marijuana use   . Nausea and vomiting    chronic, recurrent  . PONV (postoperative nausea and vomiting)    pt thinks maybe once she had N&V  . Sciatica of left side     Past Surgical History:  Procedure Laterality Date  . BALLOON DILATION  12/20/2017   Procedure: BALLOON DILATION;  Surgeon: Danie Binder, MD;  [facility];  Service: Endoscopy;;  pyloric dilation  . BIOPSY  12/20/2017   Procedure: BIOPSY;  Surgeon: Danie Binder, MD;  [facility];  Service: Endoscopy;;  duodenal and gastric biopsy  . COLONOSCOPY  01/18/2008   [id number] rectum.  Long redundant  colon, a diminutive sigmoid polyp status post cold biopsy removed. Hyperplastic polyp. Repeat colonoscopy June 2014 due to family history of colon cancer  . COLONOSCOPY WITH ESOPHAGOGASTRODUODENOSCOPY (EGD) N/A 11/02/2012   [id number] gastric mucosa of doubtful, +H.pylori. Incomplete colonoscopy due to patient unable to tolerate exam, proximal colon seen. Patient refused ACBE.  Marland Kitchen COLONOSCOPY WITH PROPOFOL N/A 03/08/2013   Dr. Gala Romney: colonic polyp-removed as scribed above. Internal Hemorrhoids. Pathology did not reveal any colonic tissue, only mucus. SURVEILLANCE DUE Aug 2019  . COLONOSCOPY WITH PROPOFOL N/A 01/19/2018   Dr. Gala Romney: 6 mm cecal inflammatoy polyp, otherwise normal., Surveilance in 5 year s  . ESOPHAGEAL DILATION  12/20/2017   Procedure: ESOPHAGEAL DILATION;  Surgeon: Danie Binder, MD;  [facility];  Service: Endoscopy;;  . ESOPHAGOGASTRODUODENOSCOPY  01/18/2008   RMR: Normal esophagus, normal  stomach  . ESOPHAGOGASTRODUODENOSCOPY (EGD) WITH PROPOFOL N/A 02/09/2016   Normal esophagus, small hiatal hernia, portal hypertensive gastropathy, normal second portion of duodenum. Due in July 2019  . ESOPHAGOGASTRODUODENOSCOPY (EGD) WITH PROPOFOL N/A 12/20/2017   Dr. Oneida Alar: benign appearing esophageal stenosis s/p dilation, mild pyloric stenosis s/p biopsy and dilation, mild duodenitis.   Marland Kitchen HYSTEROSCOPY  05/11/2016   Procedure: HYSTEROSCOPY;  Surgeon: Angelyn Punt  Glo Herring, MD;  [facility]: [facility];  Service: Gynecology;;  . KNEE SURGERY     left knee  . MULTIPLE EXTRACTIONS WITH ALVEOLOPLASTY N/A 10/15/2013   Procedure: MULTIPLE EXTRACTION WITH ALVEOLOPLASTY;  Surgeon: Gae Bon, DDS;  Location: [facility];  Service: Oral Surgery;  Laterality: N/A;  . POLYPECTOMY N/A 03/08/2013   Procedure: POLYPECTOMY;  Surgeon: Daneil Dolin, MD;  [facility];  Service: Endoscopy;  Laterality: N/A;  . POLYPECTOMY N/A 05/11/2016   Procedure: REMOVAL OF ENDOMETRIAL POLYP;  Surgeon: Jonnie Kind,  MD;  [facility];  Service: Gynecology;  Laterality: N/A;  . RESECTION DISTAL CLAVICAL Right 01/30/2019   Procedure: RESECTION DISTAL CLAVICAL;  Surgeon: Carole Civil, MD;  [facility]: [facility];  Service: Orthopedics;  Laterality: Right;  . SHOULDER OPEN ROTATOR CUFF REPAIR Right 01/30/2019   Procedure: ROTATOR CUFF REPAIR SHOULDER OPEN;  Surgeon: Carole Civil, MD;  [facility];  Service: Orthopedics;  Laterality: Right;  pt to arrive at 7:30 for PICC at 8:00  . TOTAL KNEE ARTHROPLASTY Left 02/05/2015   Procedure: LEFT TOTAL KNEE ARTHROPLASTY;  Surgeon: Carole Civil, MD;  [facility];  Service: Orthopedics;  Laterality: Left;    There were no vitals filed for this visit.  Subjective Assessment - 04/03/19 1256    Subjective   S: I didn't do my exercises this weekend, I had too much going on.    Currently in Pain?  No/denies         [facility] OT Assessment - 04/03/19 1256      Assessment   Medical Diagnosis  s/p Right Open RCR      Precautions   Precautions  Shoulder    Type of Shoulder Precautions  P/ROM x 4 weeks (8/14-9/11); AA/ROM and progress as tolerated unless MD states otherwise    Shoulder Interventions  Shoulder sling/immobilizer;At all times               OT Treatments/Exercises (OP) - 04/03/19 1301      Exercises   Exercises  Shoulder      Shoulder Exercises: Supine   Protraction  PROM;10 reps    Horizontal ABduction  PROM;10 reps    External Rotation  PROM;10 reps    Internal Rotation  PROM;10 reps    Flexion  PROM;10 reps    ABduction  PROM;10 reps    Other Supine Exercises  bridges 12X      Shoulder Exercises: Seated   Elevation  AROM;12 reps    Extension  AROM;12 reps    Row  AROM;12 reps    Other Seated Exercises  scapular depression, A/ROM, 12X      Shoulder Exercises: ROM/Strengthening   Thumb Tacks  1' low level    Anterior Glide  3x10"    Caudal Glide  3x10"    Prot/Ret//Elev/Dep  1' low level      Shoulder  Exercises: Isometric Strengthening   Flexion  --   standing 5x10"   Extension  --   standing 5x10"   External Rotation  --   standing 5x10"   Internal Rotation  --   standing 5x10"   ABduction  --   standing 5x10"   ADduction  --   standing 5x10"     Manual Therapy   Manual Therapy  Myofascial release    Manual therapy comments  completed separately from therapeutic exercises    Myofascial Release  myofascial release and manual therapy complete to right upper arm,  trapezius, and scapular regions to decrease fascial restrictions and improve pain and ROM               OT Short Term Goals - 03/20/19 1157      OT SHORT TERM GOAL #1   Title  Patient will be educated and independent with HEP to faciliate progress in therapy and allow her to return to using her RUE as her dominant extremity.     Time  4    Period  Weeks    Status  On-going    Target Date  04/15/19      OT SHORT TERM GOAL #2   Title  Patient will increase RUE P/ROM to WNL in order to increase ability to complete dressing tasks with less difficulty.     Time  4    Period  Weeks    Status  On-going      OT SHORT TERM GOAL #3   Title  Patient will report a decrease in pain level of 5/10 to improve ability to sleep in the bed vs the couch.    Time  4    Period  Weeks    Status  On-going        OT Long Term Goals - 03/20/19 1157      OT LONG TERM GOAL #1   Title  Patient will increase her RUE A/ROM to WNL to improve ability to reach into overhead cabinets.    Time  8    Period  Weeks    Status  On-going      OT LONG TERM GOAL #2   Title  Patient will increase her RUE strength to 4+/5 to improve ability to perform gardening tasks using RUE as dominant.    Time  8    Period  Weeks    Status  On-going      OT LONG TERM GOAL #3   Title  Patient will decrease her fascial restrictions to min amount or less in order to increase mobility required to complete functional reaching tasks.    Time  8     Period  Weeks    Status  On-going      OT LONG TERM GOAL #4   Title  Patient will report a decrease in pain of approximately 3/10 or less in her RUE while completing household tasks with her right arm.    Time  8    Period  Weeks    Status  On-going      OT LONG TERM GOAL #5   Title  Pt will return to highest level of functioning and independence using RUE as dominant.    Time  8    Period  Weeks    Status  On-going            Plan - 04/03/19 1333    Clinical Impression Statement  A: Continued with manual therapy, upper arm with no fascial restrictions, minimal restrictions in anterior shoulder and mod restrictions in trapezius. Continued with P/ROM, pt able to tolerate all ranges WNL today. Progressed to standing isometrics and increased to 10" holds. Verbal cuing for form and technique during session.    Body Structure / Function / Physical Skills  ADL;UE functional use;Fascial restriction;Pain;ROM;IADL;Strength    Plan  P: continue with isometrics in standing, increase all scapular ROM repetitions to 15, resume therapy ball stretches if time allows       Patient will benefit from skilled therapeutic intervention in order  to improve the following deficits and impairments:   Body Structure / Function / Physical Skills: ADL, UE functional use, Fascial restriction, Pain, ROM, IADL, Strength       Visit Diagnosis: Acute pain of right shoulder  Stiffness of right shoulder, not elsewhere classified  Other symptoms and signs involving the musculoskeletal system    Problem List Patient Active Problem List   Diagnosis Date Noted  . S/P right rotator cuff repair 01/30/19 02/06/2019  . Nontraumatic complete tear of right rotator cuff   . Arthritis of right acromioclavicular joint   . S/P total knee replacement, 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/24/2018  . Shoulder impingement, right 05/24/2018  . Dehydration   . Intractable cyclical vomiting 89/84/2103  . Pyloric stenosis in adult   .  Thrombocytopenia ([place]) 12/20/2017  . Leukocytosis   . Malnutrition of moderate degree 12/17/2017  . Acute renal failure (ARF) ([place]) 12/15/2017  . Chronic abdominal pain 12/15/2017  . Gastroenteritis 06/14/2016  . Nausea with vomiting 06/10/2016  . Uterine enlargement 06/09/2016  . Intractable nausea and vomiting 06/08/2016  . Acute infective gastroenteritis 06/08/2016  . Diarrhea 06/08/2016  . Essential hypertension 06/08/2016  . GERD (gastroesophageal reflux disease) 06/08/2016  . Abdominal pain   . Endometrial polyp 04/15/2016  . PMB (postmenopausal bleeding) 02/27/2016  . Alcoholic cirrhosis of liver without ascites ([place])   . Asthma 01/21/2016  . Hepatic cirrhosis ([place]) 09/22/2015  . Arthritis of knee, degenerative 02/05/2015  . History of Helicobacter pylori infection 10/30/2014  . Chronic [name] with cirrhosis ([place]) 04/11/2014  . De Quervain's disease (radial styloid tenosynovitis) 12/25/2013  . Anorexia 11/21/2012  . FH: colon cancer 11/21/2012  . Early satiety 10/25/2012  . Bowel habit changes 10/25/2012  . Abdominal pain, epigastric 10/25/2012  . Abdominal bloating 10/25/2012  . Constipation 10/25/2012  . Abnormal weight loss 10/25/2012  . Chronic viral [name] ([place]) 10/25/2012  . Radicular pain of left lower extremity 09/28/2012  . Back pain 09/28/2012  . Sciatica 08/10/2011  . S/P arthroscopy of left knee 08/10/2011  . Tibial plateau fracture 08/10/2011  . Pain in joint, lower leg 02/12/2011  . Stiffness of joint, not elsewhere classified, lower leg 02/12/2011  . 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 Meniscus, medial, derangement 12/29/2010  . CLOSED FRACTURE OF UPPER END OF TIBIA 08/12/2010   Guadelupe Sabin, OTR/L  [phone] 04/03/2019, 1:43 PM  [facility] [address] [place], [place], [address] Phone: [phone]   Fax:  [phone]  Name: Colleen Lee MRN: [id number] Date of Birth:  09-Aug-1955

## 2019-04-04 DIAGNOSIS — J449 Chronic obstructive pulmonary disease, unspecified: Secondary | ICD-10-CM | POA: Diagnosis not present

## 2019-04-05 ENCOUNTER — Ambulatory Visit (HOSPITAL_COMMUNITY): Payer: Medicare Other | Admitting: Occupational Therapy

## 2019-04-05 ENCOUNTER — Encounter (HOSPITAL_COMMUNITY): Payer: Self-pay | Admitting: Occupational Therapy

## 2019-04-05 ENCOUNTER — Other Ambulatory Visit: Payer: Self-pay

## 2019-04-05 DIAGNOSIS — M25511 Pain in right shoulder: Secondary | ICD-10-CM | POA: Diagnosis not present

## 2019-04-05 DIAGNOSIS — R29898 Other symptoms and signs involving the musculoskeletal system: Secondary | ICD-10-CM | POA: Diagnosis not present

## 2019-04-05 DIAGNOSIS — M25611 Stiffness of right shoulder, not elsewhere classified: Secondary | ICD-10-CM

## 2019-04-05 NOTE — Therapy (Signed)
[facility] [address] [place], [place], [address] Phone: [phone]   Fax:  [phone]  Occupational Therapy Treatment  Patient Details  Name: Colleen Lee MRN: [id number] Date of Birth: 04/08/1956 Referring Provider (OT): Dr. Arther Abbott   Encounter Date: 04/05/2019  OT End of Session - 04/05/19 1334    Visit Number  7    Number of Visits  16    Date for OT Re-Evaluation  05/15/19   mini reassessment on 04/14/19   Authorization Type  1) UHC Medicare 2) Medicaid    Authorization Time Period  visits based on medical necessity    OT Start Time  1255    OT Stop Time  1337    OT Time Calculation (min)  42 min    Activity Tolerance  Patient tolerated treatment well    Behavior During Therapy  [facility] for tasks assessed/performed       Past Medical History:  Diagnosis Date  . Asthma   . Chronic abdominal pain   . Chronic back pain   . Chronic constipation   . Cirrhosis ([place])    Metavir score F4 on elastography 2015  . Cyclical vomiting   . Fibroids   . Fibromyalgia   . GERD (gastroesophageal reflux disease)   . H. pylori infection 2014   treated with pylera, had to be treated again as it was not eradicated. Urea breath test then negative after subsequent treatment.   . [name]    HCV RNA positive 09/2012  . Hypertension   . Marijuana use   . Nausea and vomiting    chronic, recurrent  . PONV (postoperative nausea and vomiting)    pt thinks maybe once she had N&V  . Sciatica of left side     Past Surgical History:  Procedure Laterality Date  . BALLOON DILATION  12/20/2017   Procedure: BALLOON DILATION;  Surgeon: Danie Binder, MD;  [facility];  Service: Endoscopy;;  pyloric dilation  . BIOPSY  12/20/2017   Procedure: BIOPSY;  Surgeon: Danie Binder, MD;  [facility];  Service: Endoscopy;;  duodenal and gastric biopsy  . COLONOSCOPY  01/18/2008   [id number] rectum.  Long redundant  colon, a diminutive sigmoid polyp status post cold biopsy removed. Hyperplastic polyp. Repeat colonoscopy June 2014 due to family history of colon cancer  . COLONOSCOPY WITH ESOPHAGOGASTRODUODENOSCOPY (EGD) N/A 11/02/2012   [id number] gastric mucosa of doubtful, +H.pylori. Incomplete colonoscopy due to patient unable to tolerate exam, proximal colon seen. Patient refused ACBE.  Marland Kitchen COLONOSCOPY WITH PROPOFOL N/A 03/08/2013   Dr. Gala Romney: colonic polyp-removed as scribed above. Internal Hemorrhoids. Pathology did not reveal any colonic tissue, only mucus. SURVEILLANCE DUE Aug 2019  . COLONOSCOPY WITH PROPOFOL N/A 01/19/2018   Dr. Gala Romney: 6 mm cecal inflammatoy polyp, otherwise normal., Surveilance in 5 year s  . ESOPHAGEAL DILATION  12/20/2017   Procedure: ESOPHAGEAL DILATION;  Surgeon: Danie Binder, MD;  [facility];  Service: Endoscopy;;  . ESOPHAGOGASTRODUODENOSCOPY  01/18/2008   RMR: Normal esophagus, normal  stomach  . ESOPHAGOGASTRODUODENOSCOPY (EGD) WITH PROPOFOL N/A 02/09/2016   Normal esophagus, small hiatal hernia, portal hypertensive gastropathy, normal second portion of duodenum. Due in July 2019  . ESOPHAGOGASTRODUODENOSCOPY (EGD) WITH PROPOFOL N/A 12/20/2017   Dr. Oneida Alar: benign appearing esophageal stenosis s/p dilation, mild pyloric stenosis s/p biopsy and dilation, mild duodenitis.   Marland Kitchen HYSTEROSCOPY  05/11/2016   Procedure: HYSTEROSCOPY;  Surgeon: Angelyn Punt  Glo Herring, MD;  [facility];  Service: Gynecology;;  . KNEE SURGERY     left knee  . MULTIPLE EXTRACTIONS WITH ALVEOLOPLASTY N/A 10/15/2013   Procedure: MULTIPLE EXTRACTION WITH ALVEOLOPLASTY;  Surgeon: Gae Bon, DDS;  Location: [facility];  Service: Oral Surgery;  Laterality: N/A;  . POLYPECTOMY N/A 03/08/2013   Procedure: POLYPECTOMY;  Surgeon: Daneil Dolin, MD;  [facility];  Service: Endoscopy;  Laterality: N/A;  . POLYPECTOMY N/A 05/11/2016   Procedure: REMOVAL OF ENDOMETRIAL POLYP;  Surgeon: Jonnie Kind,  MD;  [facility];  Service: Gynecology;  Laterality: N/A;  . RESECTION DISTAL CLAVICAL Right 01/30/2019   Procedure: RESECTION DISTAL CLAVICAL;  Surgeon: Carole Civil, MD;  [facility];  Service: Orthopedics;  Laterality: Right;  . SHOULDER OPEN ROTATOR CUFF REPAIR Right 01/30/2019   Procedure: ROTATOR CUFF REPAIR SHOULDER OPEN;  Surgeon: Carole Civil, MD;  [facility];  Service: Orthopedics;  Laterality: Right;  pt to arrive at 7:30 for PICC at 8:00  . TOTAL KNEE ARTHROPLASTY Left 02/05/2015   Procedure: LEFT TOTAL KNEE ARTHROPLASTY;  Surgeon: Carole Civil, MD;  [facility];  Service: Orthopedics;  Laterality: Left;    There were no vitals filed for this visit.  Subjective Assessment - 04/05/19 1251    Subjective   S: It was a little sore this morning.    Currently in Pain?  Yes    Pain Score  6     Pain Location  Shoulder    Pain Orientation  Right    Pain Descriptors / Indicators  Sore    Pain Type  Acute pain    Pain Radiating Towards  n/a    Pain Onset  More than a month ago    Pain Frequency  Intermittent    Aggravating Factors   movement    Pain Relieving Factors  rest, heat    Effect of Pain on Daily Activities  mod effect on ADLs    Multiple Pain Sites  No         [facility] OT Assessment - 04/05/19 1251      Assessment   Medical Diagnosis  s/p Right Open RCR      Precautions   Precautions  Shoulder    Type of Shoulder Precautions  P/ROM x 4 weeks (8/14-9/11); AA/ROM and progress as tolerated unless MD states otherwise    Shoulder Interventions  Shoulder sling/immobilizer;At all times               OT Treatments/Exercises (OP) - 04/05/19 1258      Exercises   Exercises  Shoulder      Shoulder Exercises: Supine   Protraction  PROM;10 reps    Horizontal ABduction  PROM;10 reps    External Rotation  PROM;10 reps    Internal Rotation  PROM;10 reps    Flexion  PROM;10 reps    ABduction  PROM;10 reps    Other Supine  Exercises  bridges 15X      Shoulder Exercises: Seated   Elevation  AROM;15 reps    Extension  AROM;15 reps    Row  AROM;15 reps    Other Seated Exercises  scapular depression, A/ROM, 15X      Shoulder Exercises: Therapy Ball   Flexion  15 reps    ABduction  15 reps      Shoulder Exercises: ROM/Strengthening   Thumb Tacks  1' low level      Shoulder Exercises: Isometric  Strengthening   Flexion  --   standing 5x10"    Extension  --   standing 5x10"    External Rotation  --   standing 5x10"    Internal Rotation  --   standing 5x10"    ABduction  --   standing 5x10"    ADduction  --   standing 5x10"      Manual Therapy   Manual Therapy  Myofascial release    Manual therapy comments  completed separately from therapeutic exercises    Myofascial Release  myofascial release and manual therapy complete to right upper arm, trapezius, and scapular regions to decrease fascial restrictions and improve pain and ROM               OT Short Term Goals - 03/20/19 1157      OT SHORT TERM GOAL #1   Title  Patient will be educated and independent with HEP to faciliate progress in therapy and allow her to return to using her RUE as her dominant extremity.     Time  4    Period  Weeks    Status  On-going    Target Date  04/15/19      OT SHORT TERM GOAL #2   Title  Patient will increase RUE P/ROM to WNL in order to increase ability to complete dressing tasks with less difficulty.     Time  4    Period  Weeks    Status  On-going      OT SHORT TERM GOAL #3   Title  Patient will report a decrease in pain level of 5/10 to improve ability to sleep in the bed vs the couch.    Time  4    Period  Weeks    Status  On-going        OT Long Term Goals - 03/20/19 1157      OT LONG TERM GOAL #1   Title  Patient will increase her RUE A/ROM to WNL to improve ability to reach into overhead cabinets.    Time  8    Period  Weeks    Status  On-going      OT LONG TERM GOAL #2    Title  Patient will increase her RUE strength to 4+/5 to improve ability to perform gardening tasks using RUE as dominant.    Time  8    Period  Weeks    Status  On-going      OT LONG TERM GOAL #3   Title  Patient will decrease her fascial restrictions to min amount or less in order to increase mobility required to complete functional reaching tasks.    Time  8    Period  Weeks    Status  On-going      OT LONG TERM GOAL #4   Title  Patient will report a decrease in pain of approximately 3/10 or less in her RUE while completing household tasks with her right arm.    Time  8    Period  Weeks    Status  On-going      OT LONG TERM GOAL #5   Title  Pt will return to highest level of functioning and independence using RUE as dominant.    Time  8    Period  Weeks    Status  On-going            Plan - 04/05/19 1329    Clinical Impression Statement  A: Continued with manual therapy targeting restrictions in anterior shoulder region. Continued with standing isometrics working on using less pressure for task. Increased all scapular and therapy ball repetitions to 15. Verbal cuing for form and technique.    Body Structure / Function / Physical Skills  ADL;UE functional use;Fascial restriction;Pain;ROM;IADL;Strength    Plan  P: add pulleys in preparation for AA/ROM       Patient will benefit from skilled therapeutic intervention in order to improve the following deficits and impairments:   Body Structure / Function / Physical Skills: ADL, UE functional use, Fascial restriction, Pain, ROM, IADL, Strength       Visit Diagnosis: Acute pain of right shoulder  Stiffness of right shoulder, not elsewhere classified  Other symptoms and signs involving the musculoskeletal system    Problem List Patient Active Problem List   Diagnosis Date Noted  . S/P right rotator cuff repair 01/30/19 02/06/2019  . Nontraumatic complete tear of right rotator cuff   . Arthritis of right  acromioclavicular joint   . S/P total knee replacement, 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/24/2018  . Shoulder impingement, right 05/24/2018  . Dehydration   . Intractable cyclical vomiting [id number]  . Pyloric stenosis in adult   . Thrombocytopenia ([place]) 12/20/2017  . Leukocytosis   . Malnutrition of moderate degree 12/17/2017  . Acute renal failure (ARF) ([place]) 12/15/2017  . Chronic abdominal pain 12/15/2017  . Gastroenteritis 06/14/2016  . Nausea with vomiting 06/10/2016  . Uterine enlargement 06/09/2016  . Intractable nausea and vomiting 06/08/2016  . Acute infective gastroenteritis 06/08/2016  . Diarrhea 06/08/2016  . Essential hypertension 06/08/2016  . GERD (gastroesophageal reflux disease) 06/08/2016  . Abdominal pain   . Endometrial polyp 04/15/2016  . PMB (postmenopausal bleeding) 02/27/2016  . Alcoholic cirrhosis of liver without ascites ([place])   . Asthma 01/21/2016  . Hepatic cirrhosis ([place]) 09/22/2015  . Arthritis of knee, degenerative 02/05/2015  . History of Helicobacter pylori infection 10/30/2014  . Chronic [name] with cirrhosis ([place]) 04/11/2014  . De Quervain's disease (radial styloid tenosynovitis) 12/25/2013  . Anorexia 11/21/2012  . FH: colon cancer 11/21/2012  . Early satiety 10/25/2012  . Bowel habit changes 10/25/2012  . Abdominal pain, epigastric 10/25/2012  . Abdominal bloating 10/25/2012  . Constipation 10/25/2012  . Abnormal weight loss 10/25/2012  . Chronic viral [name] ([place]) 10/25/2012  . Radicular pain of left lower extremity 09/28/2012  . Back pain 09/28/2012  . Sciatica 08/10/2011  . S/P arthroscopy of left knee 08/10/2011  . Tibial plateau fracture 08/10/2011  . Pain in joint, lower leg 02/12/2011  . Stiffness of joint, not elsewhere classified, lower leg 02/12/2011  . 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 Meniscus, medial, derangement 12/29/2010  . CLOSED FRACTURE OF UPPER END OF TIBIA 08/12/2010   Guadelupe Sabin, OTR/L   [phone] 04/05/2019, 1:38 PM  [facility] [address] [place], [place], [address] Phone: [phone]   Fax:  [phone]  Name: Colleen Lee MRN: [id number] Date of Birth: 08/05/1955

## 2019-04-10 ENCOUNTER — Other Ambulatory Visit: Payer: Self-pay | Admitting: Orthopedic Surgery

## 2019-04-10 ENCOUNTER — Ambulatory Visit (HOSPITAL_COMMUNITY): Payer: Medicare Other | Admitting: Occupational Therapy

## 2019-04-10 ENCOUNTER — Encounter (HOSPITAL_COMMUNITY): Payer: Self-pay | Admitting: Occupational Therapy

## 2019-04-10 ENCOUNTER — Other Ambulatory Visit: Payer: Self-pay

## 2019-04-10 DIAGNOSIS — M25511 Pain in right shoulder: Secondary | ICD-10-CM | POA: Diagnosis not present

## 2019-04-10 DIAGNOSIS — R29898 Other symptoms and signs involving the musculoskeletal system: Secondary | ICD-10-CM | POA: Diagnosis not present

## 2019-04-10 DIAGNOSIS — M25611 Stiffness of right shoulder, not elsewhere classified: Secondary | ICD-10-CM | POA: Diagnosis not present

## 2019-04-10 MED ORDER — HYDROCODONE-ACETAMINOPHEN 7.5-325 MG PO TABS: 1.0000 | ORAL_TABLET | ORAL | 0 refills | Status: AC | PRN

## 2019-04-10 NOTE — Telephone Encounter (Signed)
Patient requests refill on Hydrocodone/Acetaminophen 7.5-325  Mgs.  Qty  42  Sig: Take 1 tablet by mouth every 4 (four) hours as needed for up to 7 days for moderate pain.  Patient states she uses [employer]

## 2019-04-10 NOTE — Therapy (Signed)
[place], [place], [address] Phone: [phone]   Fax:  [phone]  Occupational Therapy Treatment  Patient Details  Name: Colleen Lee MRN: [id number] Date of Birth: 1955/12/25 Referring Provider (OT): Dr. Arther Abbott   Encounter Date: 04/10/2019  OT End of Session - 04/10/19 1145    Visit Number  8    Number of Visits  16    Date for OT Re-Evaluation  05/15/19   mini reassessment on 04/14/19   Authorization Type  1) [employer] Medicare 2) Medicaid    Authorization Time Period  visits based on medical necessity    OT Start Time  1115    OT Stop Time  1156    OT Time Calculation (min)  41 min    Activity Tolerance  Patient tolerated treatment well    Behavior During Therapy  [facility] for tasks assessed/performed       Past Medical History:  Diagnosis Date  . Asthma   . Chronic abdominal pain   . Chronic back pain   . Chronic constipation   . Cirrhosis ([place])    Metavir score F4 on elastography 2015  . Cyclical vomiting   . Fibroids   . Fibromyalgia   . GERD (gastroesophageal reflux disease)   . H. pylori infection 2014   treated with pylera, had to be treated again as it was not eradicated. Urea breath test then negative after subsequent treatment.   . [name]    HCV RNA positive 09/2012  . Hypertension   . Marijuana use   . Nausea and vomiting    chronic, recurrent  . PONV (postoperative nausea and vomiting)    pt thinks maybe once she had N&V  . Sciatica of left side     Past Surgical History:  Procedure Laterality Date  . BALLOON DILATION  12/20/2017   Procedure: BALLOON DILATION;  Surgeon: Danie Binder, MD;  [facility];  Service: Endoscopy;;  pyloric dilation  . BIOPSY  12/20/2017   Procedure: BIOPSY;  Surgeon: Danie Binder, MD;  [facility];  Service: Endoscopy;;  duodenal and gastric biopsy  . COLONOSCOPY  01/18/2008   [id number] rectum.  Long redundant  colon, a diminutive sigmoid polyp status post cold biopsy removed. Hyperplastic polyp. Repeat colonoscopy June 2014 due to family history of colon cancer  . COLONOSCOPY WITH ESOPHAGOGASTRODUODENOSCOPY (EGD) N/A 11/02/2012   [id number] gastric mucosa of doubtful, +H.pylori. Incomplete colonoscopy due to patient unable to tolerate exam, proximal colon seen. Patient refused ACBE.  Marland Kitchen COLONOSCOPY WITH PROPOFOL N/A 03/08/2013   Dr. Gala Romney: colonic polyp-removed as scribed above. Internal Hemorrhoids. Pathology did not reveal any colonic tissue, only mucus. SURVEILLANCE DUE Aug 2019  . COLONOSCOPY WITH PROPOFOL N/A 01/19/2018   Dr. Gala Romney: 6 mm cecal inflammatoy polyp, otherwise normal., Surveilance in 5 year s  . ESOPHAGEAL DILATION  12/20/2017   Procedure: ESOPHAGEAL DILATION;  Surgeon: Danie Binder, MD;  [facility];  Service: Endoscopy;;  . ESOPHAGOGASTRODUODENOSCOPY  01/18/2008   RMR: Normal esophagus, normal  stomach  . ESOPHAGOGASTRODUODENOSCOPY (EGD) WITH PROPOFOL N/A 02/09/2016   Normal esophagus, small hiatal hernia, portal hypertensive gastropathy, normal second portion of duodenum. Due in July 2019  . ESOPHAGOGASTRODUODENOSCOPY (EGD) WITH PROPOFOL N/A 12/20/2017   Dr. Oneida Alar: benign appearing esophageal stenosis s/p dilation, mild pyloric stenosis s/p biopsy and dilation, mild duodenitis.   Marland Kitchen HYSTEROSCOPY  05/11/2016   Procedure: HYSTEROSCOPY;  Surgeon: Angelyn Punt  Glo Herring, MD;  Location: [facility];  Service: Gynecology;;  . KNEE SURGERY     left knee  . MULTIPLE EXTRACTIONS WITH ALVEOLOPLASTY N/A 10/15/2013   Procedure: MULTIPLE EXTRACTION WITH ALVEOLOPLASTY;  Surgeon: Gae Bon, DDS;  Location: [facility];  Service: Oral Surgery;  Laterality: N/A;  . POLYPECTOMY N/A 03/08/2013   Procedure: POLYPECTOMY;  Surgeon: Daneil Dolin, MD;  Location: [facility];  Service: Endoscopy;  Laterality: N/A;  . POLYPECTOMY N/A 05/11/2016   Procedure: REMOVAL OF ENDOMETRIAL POLYP;  Surgeon: Jonnie Kind,  MD;  Location: [facility];  Service: Gynecology;  Laterality: N/A;  . RESECTION DISTAL CLAVICAL Right 01/30/2019   Procedure: RESECTION DISTAL CLAVICAL;  Surgeon: Carole Civil, MD;  Location: [facility];  Service: Orthopedics;  Laterality: Right;  . SHOULDER OPEN ROTATOR CUFF REPAIR Right 01/30/2019   Procedure: ROTATOR CUFF REPAIR SHOULDER OPEN;  Surgeon: Carole Civil, MD;  Location: [facility];  Service: Orthopedics;  Laterality: Right;  pt to arrive at 7:30 for PICC at 8:00  . TOTAL KNEE ARTHROPLASTY Left 02/05/2015   Procedure: LEFT TOTAL KNEE ARTHROPLASTY;  Surgeon: Carole Civil, MD;  Location: [facility];  Service: Orthopedics;  Laterality: Left;    There were no vitals filed for this visit.  Subjective Assessment - 04/10/19 1117    Subjective   S: It's so so today.    Currently in Pain?  Yes    Pain Score  4     Pain Location  Shoulder    Pain Orientation  Right    Pain Descriptors / Indicators  Sore    Pain Type  Acute pain    Pain Radiating Towards  n/a    Pain Onset  More than a month ago    Pain Frequency  Intermittent    Aggravating Factors   movement    Pain Relieving Factors  rest, heat    Effect of Pain on Daily Activities  mod effect on ADLs    Multiple Pain Sites  No         [facility] OT Assessment - 04/10/19 1117      Assessment   Medical Diagnosis  s/p Right Open RCR      Precautions   Precautions  Shoulder    Type of Shoulder Precautions  P/ROM x 4 weeks (8/14-9/11); AA/ROM and progress as tolerated unless MD states otherwise    Shoulder Interventions  Shoulder sling/immobilizer;At all times               OT Treatments/Exercises (OP) - 04/10/19 1129      Exercises   Exercises  Shoulder      Shoulder Exercises: Supine   Protraction  PROM;10 reps    Horizontal ABduction  PROM;10 reps    External Rotation  PROM;10 reps    Internal Rotation  PROM;10 reps    Flexion  PROM;10 reps    ABduction  PROM;10 reps    Other Supine Exercises  bridges 20X       Shoulder Exercises: Seated   Elevation  AROM;15 reps    Extension  AROM;15 reps    Row  AROM;15 reps    Other Seated Exercises  scapular depression, A/ROM, 15X      Shoulder Exercises: Pulleys   Flexion  1 minute    ABduction  1 minute      Shoulder Exercises: Therapy Ball   Flexion  15 reps      Shoulder Exercises: ROM/Strengthening   Thumb Tacks  1'      Shoulder Exercises: Isometric Strengthening   Flexion  --   3x20" standing   Extension  --   3x20" standing   External Rotation  --   3x20" standing   Internal Rotation  --   3x20" standing   ABduction  --   3x20" standing   ADduction  --   3x20" standing     Manual Therapy   Manual Therapy  Myofascial release    Manual therapy comments  completed separately from therapeutic exercises    Myofascial Release  myofascial release and manual therapy complete to right upper arm, trapezius, and scapular regions to decrease fascial restrictions and improve pain and ROM               OT Short Term Goals - 03/20/19 1157      OT SHORT TERM GOAL #1   Title  Patient will be educated and independent with HEP to faciliate progress in therapy and allow her to return to using her RUE as her dominant extremity.     Time  4    Period  Weeks    Status  On-going    Target Date  04/15/19      OT SHORT TERM GOAL #2   Title  Patient will increase RUE P/ROM to WNL in order to increase ability to complete dressing tasks with less difficulty.     Time  4    Period  Weeks    Status  On-going      OT SHORT TERM GOAL #3   Title  Patient will report a decrease in pain level of 5/10 to improve ability to sleep in the bed vs the couch.    Time  4    Period  Weeks    Status  On-going        OT Long Term Goals - 03/20/19 1157      OT LONG TERM GOAL #1   Title  Patient will increase her RUE A/ROM to WNL to improve ability to reach into overhead cabinets.    Time  8    Period  Weeks    Status  On-going      OT LONG  TERM GOAL #2   Title  Patient will increase her RUE strength to 4+/5 to improve ability to perform gardening tasks using RUE as dominant.    Time  8    Period  Weeks    Status  On-going      OT LONG TERM GOAL #3   Title  Patient will decrease her fascial restrictions to min amount or less in order to increase mobility required to complete functional reaching tasks.    Time  8    Period  Weeks    Status  On-going      OT LONG TERM GOAL #4   Title  Patient will report a decrease in pain of approximately 3/10 or less in her RUE while completing household tasks with her right arm.    Time  8    Period  Weeks    Status  On-going      OT LONG TERM GOAL #5   Title  Pt will return to highest level of functioning and independence using RUE as dominant.    Time  8    Period  Weeks    Status  On-going            Plan - 04/10/19 1143    Clinical  Impression Statement  A: Continued with manual therapy this session working on anterior shoulder and trapezius regions. Increased isometrics to 20" holds and continued with 15 repetitions for scapular and therapy ball tasks. Added pulleys this session. Verbal cuing for form and technique.    Body Structure / Function / Physical Skills  ADL;UE functional use;Fascial restriction;Pain;ROM;IADL;Strength    Plan  P: Mini reassessment, progress note, FOTO; progress to AA/ROM in supine, standing if able to tolerate.       Patient will benefit from skilled therapeutic intervention in order to improve the following deficits and impairments:   Body Structure / Function / Physical Skills: ADL, UE functional use, Fascial restriction, Pain, ROM, IADL, Strength       Visit Diagnosis: Acute pain of right shoulder  Stiffness of right shoulder, not elsewhere classified  Other symptoms and signs involving the musculoskeletal system    Problem List Patient Active Problem List   Diagnosis Date Noted  . S/P right rotator cuff repair 01/30/19 02/06/2019   . Nontraumatic complete tear of right rotator cuff   . Arthritis of right acromioclavicular joint   . S/P total knee replacement, 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/24/2018  . Shoulder impingement, right 05/24/2018  . Dehydration   . Intractable cyclical vomiting [id number]  . Pyloric stenosis in adult   . Thrombocytopenia ([place]) 12/20/2017  . Leukocytosis   . Malnutrition of moderate degree 12/17/2017  . Acute renal failure (ARF) ([place]) 12/15/2017  . Chronic abdominal pain 12/15/2017  . Gastroenteritis 06/14/2016  . Nausea with vomiting 06/10/2016  . Uterine enlargement 06/09/2016  . Intractable nausea and vomiting 06/08/2016  . Acute infective gastroenteritis 06/08/2016  . Diarrhea 06/08/2016  . Essential hypertension 06/08/2016  . GERD (gastroesophageal reflux disease) 06/08/2016  . Abdominal pain   . Endometrial polyp 04/15/2016  . PMB (postmenopausal bleeding) 02/27/2016  . Alcoholic cirrhosis of liver without ascites ([place])   . Asthma 01/21/2016  . Hepatic cirrhosis ([place]) 09/22/2015  . Arthritis of knee, degenerative 02/05/2015  . History of Helicobacter pylori infection 10/30/2014  . Chronic [name] with cirrhosis ([place]) 04/11/2014  . De Quervain's disease (radial styloid tenosynovitis) 12/25/2013  . Anorexia 11/21/2012  . FH: colon cancer 11/21/2012  . Early satiety 10/25/2012  . Bowel habit changes 10/25/2012  . Abdominal pain, epigastric 10/25/2012  . Abdominal bloating 10/25/2012  . Constipation 10/25/2012  . Abnormal weight loss 10/25/2012  . Chronic viral [name] ([place]) 10/25/2012  . Radicular pain of left lower extremity 09/28/2012  . Back pain 09/28/2012  . Sciatica 08/10/2011  . S/P arthroscopy of left knee 08/10/2011  . Tibial plateau fracture 08/10/2011  . Pain in joint, lower leg 02/12/2011  . Stiffness of joint, not elsewhere classified, lower leg 02/12/2011  . 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 Meniscus, medial, derangement 12/29/2010  . CLOSED FRACTURE OF  UPPER END OF TIBIA 08/12/2010   Guadelupe Sabin, OTR/L  [phone] 04/10/2019, 11:59 AM  [facility] [address] [place], [place], [address] Phone: [phone]   Fax:  [phone]  Name: Colleen Lee MRN: [id number] Date of Birth: 1956-07-27

## 2019-04-11 ENCOUNTER — Ambulatory Visit (INDEPENDENT_AMBULATORY_CARE_PROVIDER_SITE_OTHER): Payer: Medicare Other | Admitting: Orthopedic Surgery

## 2019-04-11 ENCOUNTER — Encounter: Payer: Self-pay | Admitting: Orthopedic Surgery

## 2019-04-11 DIAGNOSIS — Z9889 Other specified postprocedural states: Secondary | ICD-10-CM

## 2019-04-11 DIAGNOSIS — M75121 Complete rotator cuff tear or rupture of right shoulder, not specified as traumatic: Secondary | ICD-10-CM

## 2019-04-11 MED ORDER — IBUPROFEN 800 MG PO TABS: 800.0000 mg | ORAL_TABLET | Freq: Three times a day (TID) | ORAL | 0 refills | Status: DC | PRN

## 2019-04-11 NOTE — Progress Notes (Signed)
Chief Complaint  Patient presents with  . Post-op Follow-up    right rotator cuff repair 01/30/19     71 days or 10 weeks s/p RCR  She is doing really well she is having some pain when it is rainy and she is using heating pad and ibuprofen for that she wants her ibuprofen refilled  Her passive range of motion is excellent I reviewed her therapy notes seems to be progressing well  Follow-up in 5 weeks  01/30/2019  1:31 PM  PATIENT:  Colleen Lee  63 y.o. female  PRE-OPERATIVE DIAGNOSIS:  torn right rotator cuff  POST-OPERATIVE DIAGNOSIS:  torn right rotator cuff  PROCEDURE:  Procedure(s) with comments: ROTATOR CUFF REPAIR SHOULDER OPEN (Right) -   RESECTION DISTAL CLAVICAL (Right)   Operative findings  U-shaped tear of the supraspinatus and a portion of the infraspinatus Large hook on the acromion Arthritis of the acromioclavicular joint  Implants one fiber tape with 1 swivel lock anchor, multiple side to side sutures using FiberWire  SURGEON:  Surgeon(s) and Role:    Carole Civil, MD - Primary  Encounter Diagnoses  Name Primary?  . S/P right rotator cuff repair 01/30/19 Yes  . Nontraumatic complete tear of right rotator cuff

## 2019-04-12 ENCOUNTER — Other Ambulatory Visit: Payer: Self-pay

## 2019-04-12 ENCOUNTER — Ambulatory Visit (HOSPITAL_COMMUNITY): Payer: Medicare Other | Admitting: Occupational Therapy

## 2019-04-12 ENCOUNTER — Encounter (HOSPITAL_COMMUNITY): Payer: Self-pay | Admitting: Occupational Therapy

## 2019-04-12 DIAGNOSIS — M25511 Pain in right shoulder: Secondary | ICD-10-CM

## 2019-04-12 DIAGNOSIS — M25611 Stiffness of right shoulder, not elsewhere classified: Secondary | ICD-10-CM

## 2019-04-12 DIAGNOSIS — R29898 Other symptoms and signs involving the musculoskeletal system: Secondary | ICD-10-CM

## 2019-04-12 NOTE — Therapy (Addendum)
[place], [place], [address] Phone: [phone]   Fax:  [phone]  Occupational Therapy Treatment  Patient Details  Name: Colleen Lee MRN: [id number] Date of Birth: 04-23-1956 Referring Provider (OT): Dr. Arther Abbott   Progress Note Reporting Period 03/16/2019 to 04/12/2019  See note below for Objective Data and Assessment of Progress/Goals.       Encounter Date: 04/12/2019  OT End of Session - 04/12/19 1137    Visit Number  9    Number of Visits  16    Date for OT Re-Evaluation  05/15/19    Authorization Type  1) [employer] Medicare 2) Medicaid    Authorization Time Period  visits based on medical necessity    OT Start Time  1101    OT Stop Time  1144    OT Time Calculation (min)  43 min    Activity Tolerance  Patient tolerated treatment well    Behavior During Therapy  WFL for tasks assessed/performed       Past Medical History:  Diagnosis Date  . Asthma   . Chronic abdominal pain   . Chronic back pain   . Chronic constipation   . Cirrhosis ([place])    Metavir score F4 on elastography 2015  . Cyclical vomiting   . Fibroids   . Fibromyalgia   . GERD (gastroesophageal reflux disease)   . H. pylori infection 2014   treated with pylera, had to be treated again as it was not eradicated. Urea breath test then negative after subsequent treatment.   . [name]    HCV RNA positive 09/2012  . Hypertension   . Marijuana use   . Nausea and vomiting    chronic, recurrent  . PONV (postoperative nausea and vomiting)    pt thinks maybe once she had N&V  . Sciatica of left side     Past Surgical History:  Procedure Laterality Date  . BALLOON DILATION  12/20/2017   Procedure: BALLOON DILATION;  Surgeon: Danie Binder, MD;  [facility];  Service: Endoscopy;;  pyloric dilation  . BIOPSY  12/20/2017   Procedure: BIOPSY;  Surgeon: Danie Binder, MD;  [facility];  Service:  Endoscopy;;  duodenal and gastric biopsy  . COLONOSCOPY  01/18/2008   OBS:JGGEZM rectum.  Long redundant colon, a diminutive sigmoid polyp status post cold biopsy removed. Hyperplastic polyp. Repeat colonoscopy June 2014 due to family history of colon cancer  . COLONOSCOPY WITH ESOPHAGOGASTRODUODENOSCOPY (EGD) N/A 11/02/2012   OQH:UTMLYYTK gastric mucosa of doubtful, +H.pylori. Incomplete colonoscopy due to patient unable to tolerate exam, proximal colon seen. Patient refused ACBE.  [facility] Kitchen COLONOSCOPY WITH PROPOFOL N/A 03/08/2013   Dr. Gala Romney: colonic polyp-removed as scribed above. Internal Hemorrhoids. Pathology did not reveal any colonic tissue, only mucus. SURVEILLANCE DUE Aug 2019  . COLONOSCOPY WITH PROPOFOL N/A 01/19/2018   Dr. Gala Romney: 6 mm cecal inflammatoy polyp, otherwise normal., Surveilance in 5 year s  . ESOPHAGEAL DILATION  12/20/2017   Procedure: ESOPHAGEAL DILATION;  Surgeon: Danie Binder, MD;  [facility];  Service: Endoscopy;;  . ESOPHAGOGASTRODUODENOSCOPY  01/18/2008   RMR: Normal esophagus, normal  stomach  . ESOPHAGOGASTRODUODENOSCOPY (EGD) WITH PROPOFOL N/A 02/09/2016   Normal esophagus, small hiatal hernia, portal hypertensive gastropathy, normal second portion of duodenum. Due in July 2019  . ESOPHAGOGASTRODUODENOSCOPY (EGD) WITH PROPOFOL N/A 12/20/2017   Dr. Oneida Alar: benign appearing esophageal stenosis s/p dilation, mild pyloric stenosis s/p  biopsy and dilation, mild duodenitis.   Mar[facility] Kitchen HYSTEROSCOPY  05/11/2016   Procedure: HYSTEROSCOPY;  Surgeon: Jonnie Kind, MD;  Location: [facility];  Service: Gynecology;;  . KNEE SURGERY     left knee  . MULTIPLE EXTRACTIONS WITH ALVEOLOPLASTY N/A 10/15/2013   Procedure: MULTIPLE EXTRACTION WITH ALVEOLOPLASTY;  Surgeon: Gae Bon, DDS;  Location: [facility];  Service: Oral Surgery;  Laterality: N/A;  . POLYPECTOMY N/A 03/08/2013   Procedure: POLYPECTOMY;  Surgeon: Daneil Dolin, MD;  Location: [facility];  Service: Endoscopy;  Laterality:  N/A;  . POLYPECTOMY N/A 05/11/2016   Procedure: REMOVAL OF ENDOMETRIAL POLYP;  Surgeon: Jonnie Kind, MD;  Location: [facility];  Service: Gynecology;  Laterality: N/A;  . RESECTION DISTAL CLAVICAL Right 01/30/2019   Procedure: RESECTION DISTAL CLAVICAL;  Surgeon: Carole Civil, MD;  Location: [facility];  Service: Orthopedics;  Laterality: Right;  . SHOULDER OPEN ROTATOR CUFF REPAIR Right 01/30/2019   Procedure: ROTATOR CUFF REPAIR SHOULDER OPEN;  Surgeon: Carole Civil, MD;  Location: [facility];  Service: Orthopedics;  Laterality: Right;  pt to arrive at 7:30 for PICC at 8:00  . TOTAL KNEE ARTHROPLASTY Left 02/05/2015   Procedure: LEFT TOTAL KNEE ARTHROPLASTY;  Surgeon: Carole Civil, MD;  Location: [facility];  Service: Orthopedics;  Laterality: Left;    There were no vitals filed for this visit.  Subjective Assessment - 04/12/19 1103    Subjective   S: It's going ok today.    Currently in Pain?  Yes    Pain Score  5     Pain Location  Shoulder    Pain Orientation  Right    Pain Descriptors / Indicators  Sore    Pain Type  Acute pain    Pain Radiating Towards  n/a    Pain Onset  More than a month ago    Pain Frequency  Intermittent    Aggravating Factors   movement    Pain Relieving Factors  rest, heat    Effect of Pain on Daily Activities  mod effect on ADLs    Multiple Pain Sites  No         [facility] OT Assessment - 04/12/19 1102      Assessment   Medical Diagnosis  s/p Right Open RCR      Precautions   Precautions  Shoulder    Type of Shoulder Precautions  P/ROM x 4 weeks (8/14-9/11); AA/ROM and progress as tolerated unless MD states otherwise      Observation/Other Assessments   Focus on Therapeutic Outcomes (FOTO)   50/100   51/100 previous     Palpation   Palpation comment  Min-Mod fascial restrictions along upper arm, trapezius, and medial scapular border   mod at evaluation     AROM   Overall AROM Comments  Assessed seated, er/IR adducted    AROM Assessment  Site  Shoulder    Right/Left Shoulder  Right    Right Shoulder Flexion  111 Degrees   not previously assessed   Right Shoulder ABduction  90 Degrees   not previously assessed   Right Shoulder Internal Rotation  90 Degrees   not previously assessed   Right Shoulder External Rotation  46 Degrees   not previously assessed     PROM   Overall PROM Comments  Assessed supine, er/IR adducted    PROM Assessment Site  Shoulder    Right/Left Shoulder  Right    Right Shoulder Flexion  154 Degrees  106 previous   Right Shoulder ABduction  180 Degrees   78 previous   Right Shoulder Internal Rotation  90 Degrees   same as previous   Right Shoulder External Rotation  85 Degrees   45 previous     Strength   Overall Strength Comments  Assessed seated, er/IR adducted    Strength Assessment Site  Shoulder    Right/Left Shoulder  Right    Right Shoulder Flexion  3/5   not previously assessed   Right Shoulder ABduction  3-/5   not previously assessed   Right Shoulder Internal Rotation  3/5   not previously assessed   Right Shoulder External Rotation  3/5   not previously assessed              OT Treatments/Exercises (OP) - 04/12/19 1103      Exercises   Exercises  Shoulder      Shoulder Exercises: Supine   Protraction  PROM;5 reps;AAROM;10 reps    Horizontal ABduction  PROM;5 reps;AAROM;10 reps    External Rotation  PROM;5 reps;AAROM;10 reps    Internal Rotation  PROM;5 reps;AAROM;10 reps    Flexion  PROM;5 reps;AAROM;10 reps    ABduction  PROM;5 reps;AAROM;10 reps      Shoulder Exercises: Seated   Protraction  AAROM;10 reps    Horizontal ABduction  AAROM;10 reps    External Rotation  AAROM;10 reps    Internal Rotation  AAROM;10 reps    Flexion  AAROM;10 reps    Abduction  AAROM;10 reps      Shoulder Exercises: Pulleys   Flexion  1 minute    ABduction  1 minute      Manual Therapy   Manual Therapy  Myofascial release    Manual therapy comments  completed  separately from therapeutic exercises    Myofascial Release  myofascial release and manual therapy complete to right upper arm, trapezius, and scapular regions to decrease fascial restrictions and improve pain and ROM               OT Short Term Goals - 04/12/19 1126      OT SHORT TERM GOAL #1   Title  Patient will be educated and independent with HEP to faciliate progress in therapy and allow her to return to using her RUE as her dominant extremity.     Time  4    Period  Weeks    Status  On-going    Target Date  04/15/19      OT SHORT TERM GOAL #2   Title  Patient will increase RUE P/ROM to WNL in order to increase ability to complete dressing tasks with less difficulty.     Time  4    Period  Weeks    Status  Achieved      OT SHORT TERM GOAL #3   Title  Patient will report a decrease in pain level of 5/10 to improve ability to sleep in the bed vs the couch.    Time  4    Period  Weeks    Status  Achieved        OT Long Term Goals - 03/20/19 1157      OT LONG TERM GOAL #1   Title  Patient will increase her RUE A/ROM to WNL to improve ability to reach into overhead cabinets.    Time  8    Period  Weeks    Status  On-going      OT LONG  TERM GOAL #2   Title  Patient will increase her RUE strength to 4+/5 to improve ability to perform gardening tasks using RUE as dominant.    Time  8    Period  Weeks    Status  On-going      OT LONG TERM GOAL #3   Title  Patient will decrease her fascial restrictions to min amount or less in order to increase mobility required to complete functional reaching tasks.    Time  8    Period  Weeks    Status  On-going      OT LONG TERM GOAL #4   Title  Patient will report a decrease in pain of approximately 3/10 or less in her RUE while completing household tasks with her right arm.    Time  8    Period  Weeks    Status  On-going      OT LONG TERM GOAL #5   Title  Pt will return to highest level of functioning and independence  using RUE as dominant.    Time  8    Period  Weeks    Status  On-going            Plan - 04/12/19 1120    Clinical Impression Statement  A: Mini-reassessment completed this session, pt has met 2 STGs and is progressing towards remaining goals. Continued with manual therapy to address anterior shoulder and trapezius trigger points. Progressed to AA/ROM today, completed in supine and sitting, pt with ROM WNL.    Body Structure / Function / Physical Skills  ADL;UE functional use;Fascial restriction;Pain;ROM;IADL;Strength    Plan  P: Continue with AA/ROM, add wall wash and pvc pipe slide       Patient will benefit from skilled therapeutic intervention in order to improve the following deficits and impairments:   Body Structure / Function / Physical Skills: ADL, UE functional use, Fascial restriction, Pain, ROM, IADL, Strength       Visit Diagnosis: Acute pain of right shoulder  Stiffness of right shoulder, not elsewhere classified  Other symptoms and signs involving the musculoskeletal system    Problem List Patient Active Problem List   Diagnosis Date Noted  . S/P right rotator cuff repair 01/30/19 02/06/2019  . Nontraumatic complete tear of right rotator cuff   . Arthritis of right acromioclavicular joint   . S/P total knee replacement, 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/24/2018  . Shoulder impingement, right 05/24/2018  . Dehydration   . Intractable cyclical vomiting 64/68/0321  . Pyloric stenosis in adult   . Thrombocytopenia ([place]) 12/20/2017  . Leukocytosis   . Malnutrition of moderate degree 12/17/2017  . Acute renal failure (ARF) ([place]) 12/15/2017  . Chronic abdominal pain 12/15/2017  . Gastroenteritis 06/14/2016  . Nausea with vomiting 06/10/2016  . Uterine enlargement 06/09/2016  . Intractable nausea and vomiting 06/08/2016  . Acute infective gastroenteritis 06/08/2016  . Diarrhea 06/08/2016  . Essential hypertension 06/08/2016  . GERD (gastroesophageal reflux disease)  06/08/2016  . Abdominal pain   . Endometrial polyp 04/15/2016  . PMB (postmenopausal bleeding) 02/27/2016  . Alcoholic cirrhosis of liver without ascites ([place])   . Asthma 01/21/2016  . Hepatic cirrhosis ([place]) 09/22/2015  . Arthritis of knee, degenerative 02/05/2015  . History of Helicobacter pylori infection 10/30/2014  . Chronic [name] with cirrhosis ([place]) 04/11/2014  . De Quervain's disease (radial styloid tenosynovitis) 12/25/2013  . Anorexia 11/21/2012  . FH: colon cancer 11/21/2012  . Early satiety 10/25/2012  . Bowel  habit changes 10/25/2012  . Abdominal pain, epigastric 10/25/2012  . Abdominal bloating 10/25/2012  . Constipation 10/25/2012  . Abnormal weight loss 10/25/2012  . Chronic viral [name] ([place]) 10/25/2012  . Radicular pain of left lower extremity 09/28/2012  . Back pain 09/28/2012  . Sciatica 08/10/2011  . S/P arthroscopy of left knee 08/10/2011  . Tibial plateau fracture 08/10/2011  . Pain in joint, lower leg 02/12/2011  . Stiffness of joint, not elsewhere classified, lower leg 02/12/2011  . 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 Meniscus, medial, derangement 12/29/2010  . CLOSED FRACTURE OF UPPER END OF TIBIA 08/12/2010   Guadelupe Sabin, OTR/L  [phone] 04/12/2019, 11:48 AM  [facility] [address] [place], [place], [address] Phone: [phone]   Fax:  [phone]  Name: IVYLYNN HOPPES MRN: [id number] Date of Birth: 03-Feb-1956

## 2019-04-16 ENCOUNTER — Telehealth: Payer: Self-pay | Admitting: Internal Medicine

## 2019-04-16 ENCOUNTER — Other Ambulatory Visit: Payer: Self-pay

## 2019-04-16 ENCOUNTER — Encounter: Payer: Self-pay | Admitting: *Deleted

## 2019-04-16 ENCOUNTER — Encounter (HOSPITAL_COMMUNITY): Payer: Self-pay

## 2019-04-16 ENCOUNTER — Ambulatory Visit (HOSPITAL_COMMUNITY): Payer: Medicare Other

## 2019-04-16 DIAGNOSIS — M25611 Stiffness of right shoulder, not elsewhere classified: Secondary | ICD-10-CM

## 2019-04-16 DIAGNOSIS — M25511 Pain in right shoulder: Secondary | ICD-10-CM | POA: Diagnosis not present

## 2019-04-16 DIAGNOSIS — R29898 Other symptoms and signs involving the musculoskeletal system: Secondary | ICD-10-CM

## 2019-04-16 NOTE — Telephone Encounter (Signed)
Recall sent 

## 2019-04-16 NOTE — Patient Instructions (Signed)
Perform each exercise ___10-12_____ reps. 2-3x days.   Protraction - STANDING  Start by holding a wand or cane at chest height.  Next, slowly push the wand outwards in front of your body so that your elbows become fully straightened. Then, return to the original position.     Shoulder FLEXION - STANDING - PALMS UP  In the standing position, hold a wand/cane with both arms, palms up on both sides. Raise up the wand/cane allowing your unaffected arm to perform most of the effort. Your affected arm should be partially relaxed.      Internal/External ROTATION - STANDING  In the standing position, hold a wand/cane with both hands keeping your elbows bent. Move your arms and wand/cane to one side.  Your affected arm should be partially relaxed while your unaffected arm performs most of the effort.       Shoulder ABDUCTION - STANDING  While holding a wand/cane palm face up on the injured side and palm face down on the uninjured side, slowly raise up your injured arm to the side.                     Horizontal Abduction/Adduction      Straight arms holding cane at shoulder height, bring cane to right, center, left. Repeat starting to left.   Copyright  VHI. All rights reserved.       

## 2019-04-16 NOTE — Telephone Encounter (Signed)
Recall for ultrasound 

## 2019-04-16 NOTE — Therapy (Signed)
[facility] [place], [place], [address] Phone: [phone]   Fax:  [phone]  Occupational Therapy Treatment  Patient Details  Name: Colleen Lee MRN: [id number] Date of Birth: 03/06/1956 Referring Provider (OT): Dr. Arther Abbott   Encounter Date: 04/16/2019  OT End of Session - 04/16/19 1129    Visit Number  10    Number of Visits  16    Date for OT Re-Evaluation  05/15/19    Authorization Type  1) UHC Medicare 2) Medicaid    Authorization Time Period  visits based on medical necessity. progress note on visit 19.    Authorization - Visit Number  10    Authorization - Number of Visits  19    OT Start Time  1031    OT Stop Time  1112    OT Time Calculation (min)  41 min    Activity Tolerance  Patient tolerated treatment well    Behavior During Therapy  WFL for tasks assessed/performed       Past Medical History:  Diagnosis Date  . Asthma   . Chronic abdominal pain   . Chronic back pain   . Chronic constipation   . Cirrhosis ([place])    Metavir score F4 on elastography 2015  . Cyclical vomiting   . Fibroids   . Fibromyalgia   . GERD (gastroesophageal reflux disease)   . H. pylori infection 2014   treated with pylera, had to be treated again as it was not eradicated. Urea breath test then negative after subsequent treatment.   . [name]    HCV RNA positive 09/2012  . Hypertension   . Marijuana use   . Nausea and vomiting    chronic, recurrent  . PONV (postoperative nausea and vomiting)    pt thinks maybe once she had N&V  . Sciatica of left side     Past Surgical History:  Procedure Laterality Date  . BALLOON DILATION  12/20/2017   Procedure: BALLOON DILATION;  Surgeon: Danie Binder, MD;  [facility];  Service: Endoscopy;;  pyloric dilation  . BIOPSY  12/20/2017   Procedure: BIOPSY;  Surgeon: Danie Binder, MD;  [facility];  Service: Endoscopy;;  duodenal and gastric  biopsy  . COLONOSCOPY  01/18/2008   [id number] rectum.  Long redundant colon, a diminutive sigmoid polyp status post cold biopsy removed. Hyperplastic polyp. Repeat colonoscopy June 2014 due to family history of colon cancer  . COLONOSCOPY WITH ESOPHAGOGASTRODUODENOSCOPY (EGD) N/A 11/02/2012   [id number] gastric mucosa of doubtful, +H.pylori. Incomplete colonoscopy due to patient unable to tolerate exam, proximal colon seen. Patient refused ACBE.  Marland Kitchen COLONOSCOPY WITH PROPOFOL N/A 03/08/2013   Dr. Gala Romney: colonic polyp-removed as scribed above. Internal Hemorrhoids. Pathology did not reveal any colonic tissue, only mucus. SURVEILLANCE DUE Aug 2019  . COLONOSCOPY WITH PROPOFOL N/A 01/19/2018   Dr. Gala Romney: 6 mm cecal inflammatoy polyp, otherwise normal., Surveilance in 5 year s  . ESOPHAGEAL DILATION  12/20/2017   Procedure: ESOPHAGEAL DILATION;  Surgeon: Danie Binder, MD;  [facility];  Service: Endoscopy;;  . ESOPHAGOGASTRODUODENOSCOPY  01/18/2008   RMR: Normal esophagus, normal  stomach  . ESOPHAGOGASTRODUODENOSCOPY (EGD) WITH PROPOFOL N/A 02/09/2016   Normal esophagus, small hiatal hernia, portal hypertensive gastropathy, normal second portion of duodenum. Due in July 2019  . ESOPHAGOGASTRODUODENOSCOPY (EGD) WITH PROPOFOL N/A 12/20/2017   Dr. Oneida Alar: benign appearing esophageal stenosis s/p dilation, mild pyloric stenosis s/p  biopsy and dilation, mild duodenitis.   Marland Kitchen HYSTEROSCOPY  05/11/2016   Procedure: HYSTEROSCOPY;  Surgeon: Jonnie Kind, MD;  [facility]: [facility];  Service: Gynecology;;  . KNEE SURGERY     left knee  . MULTIPLE EXTRACTIONS WITH ALVEOLOPLASTY N/A 10/15/2013   Procedure: MULTIPLE EXTRACTION WITH ALVEOLOPLASTY;  Surgeon: Gae Bon, DDS;  Location: [facility];  Service: Oral Surgery;  Laterality: N/A;  . POLYPECTOMY N/A 03/08/2013   Procedure: POLYPECTOMY;  Surgeon: Daneil Dolin, MD;  [facility];  Service: Endoscopy;  Laterality: N/A;  . POLYPECTOMY N/A 05/11/2016    Procedure: REMOVAL OF ENDOMETRIAL POLYP;  Surgeon: Jonnie Kind, MD;  [facility];  Service: Gynecology;  Laterality: N/A;  . RESECTION DISTAL CLAVICAL Right 01/30/2019   Procedure: RESECTION DISTAL CLAVICAL;  Surgeon: Carole Civil, MD;  [facility];  Service: Orthopedics;  Laterality: Right;  . SHOULDER OPEN ROTATOR CUFF REPAIR Right 01/30/2019   Procedure: ROTATOR CUFF REPAIR SHOULDER OPEN;  Surgeon: Carole Civil, MD;  [facility];  Service: Orthopedics;  Laterality: Right;  pt to arrive at 7:30 for PICC at 8:00  . TOTAL KNEE ARTHROPLASTY Left 02/05/2015   Procedure: LEFT TOTAL KNEE ARTHROPLASTY;  Surgeon: Carole Civil, MD;  [facility];  Service: Orthopedics;  Laterality: Left;    There were no vitals filed for this visit.  Subjective Assessment - 04/16/19 1053    Subjective   S: It's hurting just a little bit    Currently in Pain?  Yes    Pain Score  6     Pain Location  Shoulder    Pain Orientation  Right    Pain Descriptors / Indicators  Sore    Pain Type  Acute pain         [facility] OT Assessment - 04/16/19 1102      Assessment   Medical Diagnosis  s/p Right Open RCR      Precautions   Precautions  Shoulder    Type of Shoulder Precautions  P/ROM x 4 weeks (8/14-9/11); AA/ROM and progress as tolerated unless MD states otherwise               OT Treatments/Exercises (OP) - 04/16/19 1054      Exercises   Exercises  Shoulder      Shoulder Exercises: Supine   Protraction  PROM;5 reps;AAROM;10 reps    Horizontal ABduction  PROM;5 reps;AAROM;10 reps    External Rotation  PROM;5 reps;AAROM;10 reps    Internal Rotation  PROM;5 reps;AAROM;10 reps    Flexion  PROM;5 reps;AAROM;10 reps    ABduction  PROM;5 reps;AAROM;10 reps      Shoulder Exercises: Seated   Protraction  AAROM;10 reps    Horizontal ABduction  AAROM;10 reps;Limitations    Horizontal ABduction Limitations  below shoulder level    External Rotation  AAROM;10  reps    Internal Rotation  AAROM;10 reps    Flexion  AAROM;10 reps    Abduction  AAROM;10 reps      Shoulder Exercises: ROM/Strengthening   Wall Wash  1'    Other ROM/Strengthening Exercises  PVC pipe slide 10X      Manual Therapy   Manual Therapy  Myofascial release    Manual therapy comments  completed separately from therapeutic exercises    Myofascial Release  myofascial release and manual therapy complete to right upper arm, trapezius, and scapular regions to decrease fascial restrictions and improve pain and ROM  OT Short Term Goals - 04/16/19 1132      OT SHORT TERM GOAL #1   Title  Patient will be educated and independent with HEP to faciliate progress in therapy and allow her to return to using her RUE as her dominant extremity.     Time  4    Period  Weeks    Status  On-going    Target Date  04/15/19      OT SHORT TERM GOAL #2   Title  Patient will increase RUE P/ROM to WNL in order to increase ability to complete dressing tasks with less difficulty.     Time  4    Period  Weeks      OT SHORT TERM GOAL #3   Title  Patient will report a decrease in pain level of 5/10 to improve ability to sleep in the bed vs the couch.    Time  4    Period  Weeks        OT Long Term Goals - 04/16/19 1132      OT LONG TERM GOAL #1   Title  Patient will increase her RUE A/ROM to WNL to improve ability to reach into overhead cabinets.    Time  8    Period  Weeks    Status  On-going      OT LONG TERM GOAL #2   Title  Patient will increase her RUE strength to 4+/5 to improve ability to perform gardening tasks using RUE as dominant.    Time  8    Period  Weeks    Status  On-going      OT LONG TERM GOAL #3   Title  Patient will decrease her fascial restrictions to min amount or less in order to increase mobility required to complete functional reaching tasks.    Time  8    Period  Weeks    Status  On-going      OT LONG TERM GOAL #4   Title  Patient  will report a decrease in pain of approximately 3/10 or less in her RUE while completing household tasks with her right arm.    Time  8    Period  Weeks    Status  On-going      OT LONG TERM GOAL #5   Title  Pt will return to highest level of functioning and independence using RUE as dominant.    Time  8    Period  Weeks    Status  On-going            Plan - 04/16/19 1130    Clinical Impression Statement  A: patient complete all AA/ROM shpine and seated. Added AA/ROM to HEP. Patient was able to complete wall wash and PVC pipe slide without issues. VC were needed for form and technique. Manual techniques completed to address fascial retrictions in right UE.    Body Structure / Function / Physical Skills  ADL;UE functional use;Fascial restriction;Pain;ROM;IADL;Strength    Plan  P: Complete Pulleys and add proximal shoulder strengthening supine/seated.    Consulted and Agree with Plan of Care  Patient       Patient will benefit from skilled therapeutic intervention in order to improve the following deficits and impairments:   Body Structure / Function / Physical Skills: ADL, UE functional use, Fascial restriction, Pain, ROM, IADL, Strength       Visit Diagnosis: Stiffness of right shoulder, not elsewhere classified  Acute pain of  right shoulder  Other symptoms and signs involving the musculoskeletal system    Problem List Patient Active Problem List   Diagnosis Date Noted  . S/P right rotator cuff repair 01/30/19 02/06/2019  . Nontraumatic complete tear of right rotator cuff   . Arthritis of right acromioclavicular joint   . S/P total knee replacement, 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/24/2018  . Shoulder impingement, right 05/24/2018  . Dehydration   . Intractable cyclical vomiting [id number]  . Pyloric stenosis in adult   . Thrombocytopenia ([place]) 12/20/2017  . Leukocytosis   . Malnutrition of moderate degree 12/17/2017  . Acute renal failure (ARF) ([place]) 12/15/2017  . Chronic  abdominal pain 12/15/2017  . Gastroenteritis 06/14/2016  . Nausea with vomiting 06/10/2016  . Uterine enlargement 06/09/2016  . Intractable nausea and vomiting 06/08/2016  . Acute infective gastroenteritis 06/08/2016  . Diarrhea 06/08/2016  . Essential hypertension 06/08/2016  . GERD (gastroesophageal reflux disease) 06/08/2016  . Abdominal pain   . Endometrial polyp 04/15/2016  . PMB (postmenopausal bleeding) 02/27/2016  . Alcoholic cirrhosis of liver without ascites ([place])   . Asthma 01/21/2016  . Hepatic cirrhosis ([place]) 09/22/2015  . Arthritis of knee, degenerative 02/05/2015  . History of Helicobacter pylori infection 10/30/2014  . Chronic [name] with cirrhosis ([place]) 04/11/2014  . De Quervain's disease (radial styloid tenosynovitis) 12/25/2013  . Anorexia 11/21/2012  . FH: colon cancer 11/21/2012  . Early satiety 10/25/2012  . Bowel habit changes 10/25/2012  . Abdominal pain, epigastric 10/25/2012  . Abdominal bloating 10/25/2012  . Constipation 10/25/2012  . Abnormal weight loss 10/25/2012  . Chronic viral [name] ([place]) 10/25/2012  . Radicular pain of left lower extremity 09/28/2012  . Back pain 09/28/2012  . Sciatica 08/10/2011  . S/P arthroscopy of left knee 08/10/2011  . Tibial plateau fracture 08/10/2011  . Pain in joint, lower leg 02/12/2011  . Stiffness of joint, not elsewhere classified, lower leg 02/12/2011  . 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 Meniscus, medial, derangement 12/29/2010  . CLOSED FRACTURE OF UPPER END OF TIBIA 08/12/2010   Ailene Ravel, OTR/L,CBIS  [phone]  04/16/2019, 11:33 AM  [facility] [address] [place], [place], [address] Phone: [phone]   Fax:  [phone]  Name: ELANA CLITES MRN: [id number] Date of Birth: 1955/12/16

## 2019-04-17 ENCOUNTER — Other Ambulatory Visit: Payer: Self-pay | Admitting: Radiology

## 2019-04-17 NOTE — Telephone Encounter (Signed)
Requests refill hydrocodone. 

## 2019-04-18 ENCOUNTER — Other Ambulatory Visit: Payer: Self-pay | Admitting: Orthopedic Surgery

## 2019-04-18 DIAGNOSIS — G8918 Other acute postprocedural pain: Secondary | ICD-10-CM

## 2019-04-18 MED ORDER — HYDROCODONE-ACETAMINOPHEN 5-325 MG PO TABS: 1.0000 | ORAL_TABLET | Freq: Four times a day (QID) | ORAL | 0 refills | Status: DC | PRN

## 2019-04-18 NOTE — Progress Notes (Signed)
norco

## 2019-04-19 ENCOUNTER — Encounter (HOSPITAL_COMMUNITY): Payer: Self-pay

## 2019-04-19 ENCOUNTER — Other Ambulatory Visit: Payer: Self-pay

## 2019-04-19 ENCOUNTER — Ambulatory Visit (HOSPITAL_COMMUNITY): Payer: Medicare Other

## 2019-04-19 DIAGNOSIS — R29898 Other symptoms and signs involving the musculoskeletal system: Secondary | ICD-10-CM | POA: Diagnosis not present

## 2019-04-19 DIAGNOSIS — M25611 Stiffness of right shoulder, not elsewhere classified: Secondary | ICD-10-CM | POA: Diagnosis not present

## 2019-04-19 DIAGNOSIS — M25511 Pain in right shoulder: Secondary | ICD-10-CM

## 2019-04-19 NOTE — Therapy (Signed)
[facility] [place], [place], [address] Phone: [phone]   Fax:  [phone]  Occupational Therapy Treatment  Patient Details  Name: Colleen Lee MRN: [id number] Date of Birth: [birth year] Referring Provider (OT): Dr. Arther Abbott   Encounter Date: 04/19/2019  OT End of Session - 04/19/19 1050    Visit Number  11    Number of Visits  16    Date for OT Re-Evaluation  05/15/19    Authorization Type  1) UHC Medicare 2) Medicaid    Authorization Time Period  visits based on medical necessity. progress note on visit 19.    Authorization - Visit Number  11    Authorization - Number of Visits  47    OT Start Time  [id number]   Session started late as patient requested to use the bathroom.   OT Stop Time  1110    OT Time Calculation (min)  35 min    Activity Tolerance  Patient tolerated treatment well    Behavior During Therapy  WFL for tasks assessed/performed       Past Medical History:  Diagnosis Date  . Asthma   . Chronic abdominal pain   . Chronic back pain   . Chronic constipation   . Cirrhosis ([place])    Metavir score F4 on elastography 2015  . Cyclical vomiting   . Fibroids   . Fibromyalgia   . GERD (gastroesophageal reflux disease)   . H. pylori infection 2014   treated with pylera, had to be treated again as it was not eradicated. Urea breath test then negative after subsequent treatment.   . [name]    HCV RNA positive 09/2012  . Hypertension   . Marijuana use   . Nausea and vomiting    chronic, recurrent  . PONV (postoperative nausea and vomiting)    pt thinks maybe once she had N&V  . Sciatica of left side     Past Surgical History:  Procedure Laterality Date  . BALLOON DILATION  12/20/2017   Procedure: BALLOON DILATION;  Surgeon: Danie Binder, MD;  [facility];  Service: Endoscopy;;  pyloric dilation  . BIOPSY  12/20/2017   Procedure: BIOPSY;  Surgeon: Danie Binder, MD;   [facility];  Service: Endoscopy;;  duodenal and gastric biopsy  . COLONOSCOPY  01/18/2008   [id number] rectum.  Long redundant colon, a diminutive sigmoid polyp status post cold biopsy removed. Hyperplastic polyp. Repeat colonoscopy June 2014 due to family history of colon cancer  . COLONOSCOPY WITH ESOPHAGOGASTRODUODENOSCOPY (EGD) N/A 11/02/2012   [id number] gastric mucosa of doubtful, +H.pylori. Incomplete colonoscopy due to patient unable to tolerate exam, proximal colon seen. Patient refused ACBE.  Marland Kitchen COLONOSCOPY WITH PROPOFOL N/A 03/08/2013   Dr. Gala Romney: colonic polyp-removed as scribed above. Internal Hemorrhoids. Pathology did not reveal any colonic tissue, only mucus. SURVEILLANCE DUE Aug 2019  . COLONOSCOPY WITH PROPOFOL N/A 01/19/2018   Dr. Gala Romney: 6 mm cecal inflammatoy polyp, otherwise normal., Surveilance in 5 year s  . ESOPHAGEAL DILATION  12/20/2017   Procedure: ESOPHAGEAL DILATION;  Surgeon: Danie Binder, MD;  [facility];  Service: Endoscopy;;  . ESOPHAGOGASTRODUODENOSCOPY  01/18/2008   RMR: Normal esophagus, normal  stomach  . ESOPHAGOGASTRODUODENOSCOPY (EGD) WITH PROPOFOL N/A 02/09/2016   Normal esophagus, small hiatal hernia, portal hypertensive gastropathy, normal second portion of duodenum. Due in July 2019  . ESOPHAGOGASTRODUODENOSCOPY (EGD) WITH PROPOFOL N/A 12/20/2017   Dr.  Fields: benign appearing esophageal stenosis s/p dilation, mild pyloric stenosis s/p biopsy and dilation, mild duodenitis.   Marland Kitchen HYSTEROSCOPY  05/11/2016   Procedure: HYSTEROSCOPY;  Surgeon: Jonnie Kind, MD;  [facility];  Service: Gynecology;;  . KNEE SURGERY     left knee  . MULTIPLE EXTRACTIONS WITH ALVEOLOPLASTY N/A 10/15/2013   Procedure: MULTIPLE EXTRACTION WITH ALVEOLOPLASTY;  Surgeon: Gae Bon, DDS;  Location: [facility];  Service: Oral Surgery;  Laterality: N/A;  . POLYPECTOMY N/A 03/08/2013   Procedure: POLYPECTOMY;  Surgeon: Daneil Dolin, MD;  [facility];   Service: Endoscopy;  Laterality: N/A;  . POLYPECTOMY N/A 05/11/2016   Procedure: REMOVAL OF ENDOMETRIAL POLYP;  Surgeon: Jonnie Kind, MD;  [facility];  Service: Gynecology;  Laterality: N/A;  . RESECTION DISTAL CLAVICAL Right 01/30/2019   Procedure: RESECTION DISTAL CLAVICAL;  Surgeon: Carole Civil, MD;  [facility];  Service: Orthopedics;  Laterality: Right;  . SHOULDER OPEN ROTATOR CUFF REPAIR Right 01/30/2019   Procedure: ROTATOR CUFF REPAIR SHOULDER OPEN;  Surgeon: Carole Civil, MD;  [facility];  Service: Orthopedics;  Laterality: Right;  pt to arrive at 7:30 for PICC at 8:00  . TOTAL KNEE ARTHROPLASTY Left 02/05/2015   Procedure: LEFT TOTAL KNEE ARTHROPLASTY;  Surgeon: Carole Civil, MD;  [facility];  Service: Orthopedics;  Laterality: Left;    There were no vitals filed for this visit.  Subjective Assessment - 04/19/19 1049    Subjective   S: This rain is causing it to be sore.    Currently in Pain?  Yes    Pain Score  8     Pain Location  Shoulder    Pain Orientation  Right    Pain Descriptors / Indicators  Sore    Pain Type  Acute pain    Pain Radiating Towards  N/A    Pain Onset  More than a month ago    Pain Frequency  Constant    Aggravating Factors   weather    Pain Relieving Factors  rest. heat    Effect of Pain on Daily Activities  mod effect on ADLs    Multiple Pain Sites  No         [facility] OT Assessment - 04/19/19 1051      Assessment   Medical Diagnosis  s/p Right Open RCR      Precautions   Precautions  Shoulder    Type of Shoulder Precautions   AA/ROM and progress as tolerated unless MD states otherwise               OT Treatments/Exercises (OP) - 04/19/19 1051      Exercises   Exercises  Shoulder      Shoulder Exercises: Supine   Protraction  PROM;5 reps;AAROM;10 reps    Horizontal ABduction  PROM;5 reps;AAROM;10 reps    External Rotation  PROM;5 reps;AAROM;10 reps    Internal Rotation  PROM;5  reps;AAROM;10 reps    Flexion  PROM;5 reps;AAROM;10 reps    ABduction  PROM;5 reps;AAROM;10 reps      Shoulder Exercises: Seated   Horizontal ABduction  AAROM;10 reps;Limitations    Horizontal ABduction Limitations  continues to struggle maintaining shoulder level    Internal Rotation  AAROM;10 reps    Flexion  AAROM;10 reps    Abduction  AAROM;10 reps      Shoulder Exercises: Pulleys   Flexion  1 minute      Shoulder Exercises:  ROM/Strengthening   Proximal Shoulder Strengthening, Supine  10X A/ROM with no rest breaks. Verbal and visual cues to complete      Manual Therapy   Manual Therapy  Myofascial release               OT Short Term Goals - 04/16/19 1132      OT SHORT TERM GOAL #1   Title  Patient will be educated and independent with HEP to faciliate progress in therapy and allow her to return to using her RUE as her dominant extremity.     Time  4    Period  Weeks    Status  On-going    Target Date  04/15/19      OT SHORT TERM GOAL #2   Title  Patient will increase RUE P/ROM to WNL in order to increase ability to complete dressing tasks with less difficulty.     Time  4    Period  Weeks      OT SHORT TERM GOAL #3   Title  Patient will report a decrease in pain level of 5/10 to improve ability to sleep in the bed vs the couch.    Time  4    Period  Weeks        OT Long Term Goals - 04/16/19 1132      OT LONG TERM GOAL #1   Title  Patient will increase her RUE A/ROM to WNL to improve ability to reach into overhead cabinets.    Time  8    Period  Weeks    Status  On-going      OT LONG TERM GOAL #2   Title  Patient will increase her RUE strength to 4+/5 to improve ability to perform gardening tasks using RUE as dominant.    Time  8    Period  Weeks    Status  On-going      OT LONG TERM GOAL #3   Title  Patient will decrease her fascial restrictions to min amount or less in order to increase mobility required to complete functional reaching tasks.     Time  8    Period  Weeks    Status  On-going      OT LONG TERM GOAL #4   Title  Patient will report a decrease in pain of approximately 3/10 or less in her RUE while completing household tasks with her right arm.    Time  8    Period  Weeks    Status  On-going      OT LONG TERM GOAL #5   Title  Pt will return to highest level of functioning and independence using RUE as dominant.    Time  8    Period  Weeks    Status  On-going            Plan - 04/19/19 1132    Clinical Impression Statement  A: Continued with AA/ROM supine and seated. Continues to require VC for form and technique in addition to visual cues as needed. Added proximal shoulder strenthening supine and continued with pulleys. Pt presented with max fascial restrictions in her right upper arm, upper trapezius, and scapularis region and required manual techiques to address.    Body Structure / Function / Physical Skills  ADL;UE functional use;Fascial restriction;Pain;ROM;IADL;Strength    Plan  P: Complete wall wash and seated proximal shoulder strengthening.    Consulted and Agree with Plan of Care  Patient  Patient will benefit from skilled therapeutic intervention in order to improve the following deficits and impairments:   Body Structure / Function / Physical Skills: ADL, UE functional use, Fascial restriction, Pain, ROM, IADL, Strength       Visit Diagnosis: Stiffness of right shoulder, not elsewhere classified  Other symptoms and signs involving the musculoskeletal system  Acute pain of right shoulder    Problem List Patient Active Problem List   Diagnosis Date Noted  . S/P right rotator cuff repair 01/30/19 02/06/2019  . Nontraumatic complete tear of right rotator cuff   . Arthritis of right acromioclavicular joint   . S/P total knee replacement, 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/24/2018  . Shoulder impingement, right 05/24/2018  . Dehydration   . Intractable cyclical vomiting [id number]  . Pyloric  stenosis in adult   . Thrombocytopenia ([place]) 12/20/2017  . Leukocytosis   . Malnutrition of moderate degree 12/17/2017  . Acute renal failure (ARF) ([place]) 12/15/2017  . Chronic abdominal pain 12/15/2017  . Gastroenteritis 06/14/2016  . Nausea with vomiting 06/10/2016  . Uterine enlargement 06/09/2016  . Intractable nausea and vomiting 06/08/2016  . Acute infective gastroenteritis 06/08/2016  . Diarrhea 06/08/2016  . Essential hypertension 06/08/2016  . GERD (gastroesophageal reflux disease) 06/08/2016  . Abdominal pain   . Endometrial polyp 04/15/2016  . PMB (postmenopausal bleeding) 02/27/2016  . Alcoholic cirrhosis of liver without ascites ([place])   . Asthma 01/21/2016  . Hepatic cirrhosis ([employer]) 09/22/2015  . Arthritis of knee, degenerative 02/05/2015  . History of Helicobacter pylori infection 10/30/2014  . Chronic [name] with cirrhosis ([place]) 04/11/2014  . De Quervain's disease (radial styloid tenosynovitis) 12/25/2013  . Anorexia 11/21/2012  . FH: colon cancer 11/21/2012  . Early satiety 10/25/2012  . Bowel habit changes 10/25/2012  . Abdominal pain, epigastric 10/25/2012  . Abdominal bloating 10/25/2012  . Constipation 10/25/2012  . Abnormal weight loss 10/25/2012  . Chronic viral [name] ([place]) 10/25/2012  . Radicular pain of left lower extremity 09/28/2012  . Back pain 09/28/2012  . Sciatica 08/10/2011  . S/P arthroscopy of left knee 08/10/2011  . Tibial plateau fracture 08/10/2011  . Pain in joint, lower leg 02/12/2011  . Stiffness of joint, not elsewhere classified, lower leg 02/12/2011  . 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 Meniscus, medial, derangement 12/29/2010  . CLOSED FRACTURE OF UPPER END OF TIBIA 08/12/2010   Ailene Ravel, OTR/L,CBIS  [phone]  04/19/2019, 11:40 AM  [facility] [address] [place], [place], [address] Phone: [phone]   Fax:  [phone]  Name: Colleen Lee MRN:  [id number] Date of Birth: 1956-06-06

## 2019-04-24 ENCOUNTER — Ambulatory Visit (HOSPITAL_COMMUNITY): Payer: Medicare Other | Admitting: Occupational Therapy

## 2019-04-24 ENCOUNTER — Telehealth (HOSPITAL_COMMUNITY): Payer: Self-pay | Admitting: Internal Medicine

## 2019-04-24 ENCOUNTER — Other Ambulatory Visit: Payer: Self-pay

## 2019-04-24 DIAGNOSIS — G8918 Other acute postprocedural pain: Secondary | ICD-10-CM

## 2019-04-24 MED ORDER — HYDROCODONE-ACETAMINOPHEN 5-325 MG PO TABS: 1.0000 | ORAL_TABLET | Freq: Four times a day (QID) | ORAL | 0 refills | Status: DC | PRN

## 2019-04-24 NOTE — Telephone Encounter (Signed)
Hydrocodone-Acetaminophen  5/325 mg  Qty 28 Tablets ° °Take 1 tablet by mouth every 6 (six) hours as needed for up to 7 days for moderate pain. ° °PATIENT USES  [facility] °

## 2019-04-24 NOTE — Telephone Encounter (Signed)
04/24/19  pt called to cx said that her stomach is "tore up"

## 2019-04-25 DIAGNOSIS — E785 Hyperlipidemia, unspecified: Secondary | ICD-10-CM | POA: Diagnosis not present

## 2019-04-25 DIAGNOSIS — J449 Chronic obstructive pulmonary disease, unspecified: Secondary | ICD-10-CM | POA: Diagnosis not present

## 2019-04-25 DIAGNOSIS — K219 Gastro-esophageal reflux disease without esophagitis: Secondary | ICD-10-CM | POA: Diagnosis not present

## 2019-04-26 ENCOUNTER — Ambulatory Visit (HOSPITAL_COMMUNITY): Payer: Medicare Other | Admitting: Occupational Therapy

## 2019-04-26 ENCOUNTER — Other Ambulatory Visit: Payer: Self-pay

## 2019-04-26 ENCOUNTER — Encounter (HOSPITAL_COMMUNITY): Payer: Self-pay | Admitting: Occupational Therapy

## 2019-04-26 DIAGNOSIS — M25511 Pain in right shoulder: Secondary | ICD-10-CM | POA: Diagnosis not present

## 2019-04-26 DIAGNOSIS — M25611 Stiffness of right shoulder, not elsewhere classified: Secondary | ICD-10-CM | POA: Diagnosis not present

## 2019-04-26 DIAGNOSIS — R29898 Other symptoms and signs involving the musculoskeletal system: Secondary | ICD-10-CM | POA: Diagnosis not present

## 2019-04-26 NOTE — Patient Instructions (Signed)
Perform each exercise ___12-15_____ reps. 2-3x days.   Protraction   Start by holding a wand or cane at chest height.  Next, slowly push the wand outwards in front of your body so that your elbows become fully straightened. Then, return to the original position.     Shoulder FLEXION   In the standing position, hold a wand/cane with both arms, palms down on both sides. Raise up the wand/cane allowing your unaffected arm to perform most of the effort. Your affected arm should be partially relaxed.      Internal/External ROTATION   In the standing position, hold a wand/cane with both hands keeping your elbows bent. Move your arms and wand/cane to one side.  Your affected arm should be partially relaxed while your unaffected arm performs most of the effort.       Shoulder ABDUCTION   While holding a wand/cane palm face up on the injured side and palm face down on the uninjured side, slowly raise up your injured arm to the side.                     Horizontal Abduction/Adduction      Straight arms holding cane at shoulder height, bring cane to right, center, left. Repeat starting to left.   Copyright  V[employer]. All rights reserved.

## 2019-04-26 NOTE — Therapy (Signed)
[place], [place], [address] Phone: [phone]   Fax:  [phone]  Occupational Therapy Treatment  Patient Details  Name: Colleen Lee MRN: [id number] Date of Birth: 27-Jun-1956 Referring Provider (OT): Dr. Arther Abbott   Encounter Date: 04/26/2019  OT End of Session - 04/26/19 1028    Visit Number  12    Number of Visits  16    Date for OT Re-Evaluation  05/15/19    Authorization Type  1) [employer] Medicare 2) Medicaid    Authorization Time Period  visits based on medical necessity. progress note on visit 19.    Authorization - Visit Number  12    Authorization - Number of Visits  19    OT Start Time  [phone]    OT Stop Time  1027    OT Time Calculation (min)  40 min    Activity Tolerance  Patient tolerated treatment well    Behavior During Therapy  WFL for tasks assessed/performed       Past Medical History:  Diagnosis Date  . Asthma   . Chronic abdominal pain   . Chronic back pain   . Chronic constipation   . Cirrhosis ([place])    Metavir score F4 on elastography 2015  . Cyclical vomiting   . Fibroids   . Fibromyalgia   . GERD (gastroesophageal reflux disease)   . H. pylori infection 2014   treated with pylera, had to be treated again as it was not eradicated. Urea breath test then negative after subsequent treatment.   . [name]    HCV RNA positive 09/2012  . Hypertension   . Marijuana use   . Nausea and vomiting    chronic, recurrent  . PONV (postoperative nausea and vomiting)    pt thinks maybe once she had N&V  . Sciatica of left side     Past Surgical History:  Procedure Laterality Date  . BALLOON DILATION  12/20/2017   Procedure: BALLOON DILATION;  Surgeon: Danie Binder, MD;  [facility];  Service: Endoscopy;;  pyloric dilation  . BIOPSY  12/20/2017   Procedure: BIOPSY;  Surgeon: Danie Binder, MD;  [facility];  Service: Endoscopy;;  duodenal and gastric  biopsy  . COLONOSCOPY  01/18/2008   [id number] rectum.  Long redundant colon, a diminutive sigmoid polyp status post cold biopsy removed. Hyperplastic polyp. Repeat colonoscopy June 2014 due to family history of colon cancer  . COLONOSCOPY WITH ESOPHAGOGASTRODUODENOSCOPY (EGD) N/A 11/02/2012   [id number] gastric mucosa of doubtful, +H.pylori. Incomplete colonoscopy due to patient unable to tolerate exam, proximal colon seen. Patient refused ACBE.  Marland Kitchen COLONOSCOPY WITH PROPOFOL N/A 03/08/2013   Dr. Gala Romney: colonic polyp-removed as scribed above. Internal Hemorrhoids. Pathology did not reveal any colonic tissue, only mucus. SURVEILLANCE DUE Aug 2019  . COLONOSCOPY WITH PROPOFOL N/A 01/19/2018   Dr. Gala Romney: 6 mm cecal inflammatoy polyp, otherwise normal., Surveilance in 5 year s  . ESOPHAGEAL DILATION  12/20/2017   Procedure: ESOPHAGEAL DILATION;  Surgeon: Danie Binder, MD;  [facility];  Service: Endoscopy;;  . ESOPHAGOGASTRODUODENOSCOPY  01/18/2008   RMR: Normal esophagus, normal  stomach  . ESOPHAGOGASTRODUODENOSCOPY (EGD) WITH PROPOFOL N/A 02/09/2016   Normal esophagus, small hiatal hernia, portal hypertensive gastropathy, normal second portion of duodenum. Due in July 2019  . ESOPHAGOGASTRODUODENOSCOPY (EGD) WITH PROPOFOL N/A 12/20/2017   Dr. Oneida Alar: benign appearing esophageal stenosis s/p dilation, mild pyloric stenosis s/p  biopsy and dilation, mild duodenitis.   Marland Kitchen HYSTEROSCOPY  05/11/2016   Procedure: HYSTEROSCOPY;  Surgeon: Jonnie Kind, MD;  Location: [facility];  Service: Gynecology;;  . KNEE SURGERY     left knee  . MULTIPLE EXTRACTIONS WITH ALVEOLOPLASTY N/A 10/15/2013   Procedure: MULTIPLE EXTRACTION WITH ALVEOLOPLASTY;  Surgeon: Gae Bon, DDS;  Location: [place] [facility];  Service: Oral Surgery;  Laterality: N/A;  . POLYPECTOMY N/A 03/08/2013   Procedure: POLYPECTOMY;  Surgeon: Daneil Dolin, MD;  Location: [facility];  Service: Endoscopy;  Laterality: N/A;  . POLYPECTOMY N/A 05/11/2016    Procedure: REMOVAL OF ENDOMETRIAL POLYP;  Surgeon: Jonnie Kind, MD;  Location: [facility];  Service: Gynecology;  Laterality: N/A;  . RESECTION DISTAL CLAVICAL Right 01/30/2019   Procedure: RESECTION DISTAL CLAVICAL;  Surgeon: Carole Civil, MD;  Location: [facility];  Service: Orthopedics;  Laterality: Right;  . SHOULDER OPEN ROTATOR CUFF REPAIR Right 01/30/2019   Procedure: ROTATOR CUFF REPAIR SHOULDER OPEN;  Surgeon: Carole Civil, MD;  Location: [facility];  Service: Orthopedics;  Laterality: Right;  pt to arrive at 7:30 for PICC at 8:00  . TOTAL KNEE ARTHROPLASTY Left 02/05/2015   Procedure: LEFT TOTAL KNEE ARTHROPLASTY;  Surgeon: Carole Civil, MD;  Location[facility];  Service: Orthopedics;  Laterality: Left;    There were no vitals filed for this visit.  Subjective Assessment - 04/26/19 0947    Subjective   S: It's doing alright, same old thing.    Currently in Pain?  No/denies         [facility] OT Assessment - 04/26/19 0946      Assessment   Medical Diagnosis  s/p Right Open RCR      Precautions   Precautions  Shoulder    Type of Shoulder Precautions   AA/ROM and progress as tolerated unless MD states otherwise               OT Treatments/Exercises (OP) - 04/26/19 0949      Exercises   Exercises  Shoulder      Shoulder Exercises: Supine   Protraction  PROM;5 reps;AAROM;12 reps    Horizontal ABduction  PROM;5 reps;AAROM;12 reps    External Rotation  PROM;5 reps;AAROM;12 reps    Internal Rotation  PROM;5 reps;AAROM;12 reps    Flexion  PROM;5 reps;AAROM;12 reps    ABduction  PROM;5 reps;AAROM;12 reps      Shoulder Exercises: Seated   Protraction  AAROM;12 reps    Horizontal ABduction  AAROM;12 reps    Horizontal ABduction Limitations  visual cuing to keep elbows straight, verbal cuing to not twist trunk    External Rotation  AAROM;12 reps    Internal Rotation  AAROM;12 reps    Flexion  AAROM;12 reps    Abduction  AAROM;12 reps      Shoulder  Exercises: Therapy Ball   Right/Left  5 reps   each direction     Shoulder Exercises: ROM/Strengthening   Proximal Shoulder Strengthening, Supine  10X A/ROM with no rest breaks. Verbal and visual cues to complete    Proximal Shoulder Strengthening, Seated  10X A/ROM with verbal and visual cuing      Manual Therapy   Manual Therapy  Myofascial release    Manual therapy comments  completed separately from therapeutic exercises    Myofascial Release  myofascial release and manual therapy complete to right upper arm, trapezius, and scapular regions to decrease fascial restrictions and improve pain and ROM  OT Education - 04/26/19 1008    Education Details  AA/ROM exercises    Person(s) Educated  Patient    Methods  Explanation;Demonstration    Comprehension  Verbalized understanding;Returned demonstration       OT Short Term Goals - 04/16/19 1132      OT SHORT TERM GOAL #1   Title  Patient will be educated and independent with HEP to faciliate progress in therapy and allow her to return to using her RUE as her dominant extremity.     Time  4    Period  Weeks    Status  On-going    Target Date  04/15/19      OT SHORT TERM GOAL #2   Title  Patient will increase RUE P/ROM to WNL in order to increase ability to complete dressing tasks with less difficulty.     Time  4    Period  Weeks      OT SHORT TERM GOAL #3   Title  Patient will report a decrease in pain level of 5/10 to improve ability to sleep in the bed vs the couch.    Time  4    Period  Weeks        OT Long Term Goals - 04/16/19 1132      OT LONG TERM GOAL #1   Title  Patient will increase her RUE A/ROM to WNL to improve ability to reach into overhead cabinets.    Time  8    Period  Weeks    Status  On-going      OT LONG TERM GOAL #2   Title  Patient will increase her RUE strength to 4+/5 to improve ability to perform gardening tasks using RUE as dominant.    Time  8    Period  Weeks     Status  On-going      OT LONG TERM GOAL #3   Title  Patient will decrease her fascial restrictions to min amount or less in order to increase mobility required to complete functional reaching tasks.    Time  8    Period  Weeks    Status  On-going      OT LONG TERM GOAL #4   Title  Patient will report a decrease in pain of approximately 3/10 or less in her RUE while completing household tasks with her right arm.    Time  8    Period  Weeks    Status  On-going      OT LONG TERM GOAL #5   Title  Pt will return to highest level of functioning and independence using RUE as dominant.    Time  8    Period  Weeks    Status  On-going            Plan - 04/26/19 1004    Clinical Impression Statement  A: Continued with manual therapy to address fascial restrictions in right upper arm, trapezius, and scapularis regions. Continued with AA/ROM increasing to 12 repetitions. Added proximal shoulder strengthening in sitting, also added ball circles to begin working on IR. Pt requires verbal and visual cuing for correct form throughout session.    Body Structure / Function / Physical Skills  ADL;UE functional use;Fascial restriction;Pain;ROM;IADL;Strength    Plan  P: Follow up on HEP. Begin progressing to A/ROM in supine and work on independence with form during exercises.       Patient will benefit from skilled therapeutic intervention in  order to improve the following deficits and impairments:   Body Structure / Function / Physical Skills: ADL, UE functional use, Fascial restriction, Pain, ROM, IADL, Strength       Visit Diagnosis: Stiffness of right shoulder, not elsewhere classified  Other symptoms and signs involving the musculoskeletal system  Acute pain of right shoulder    Problem List Patient Active Problem List   Diagnosis Date Noted  . S/P right rotator cuff repair 01/30/19 02/06/2019  . Nontraumatic complete tear of right rotator cuff   . Arthritis of right  acromioclavicular joint   . S/P total knee replacement, 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/24/2018  . Shoulder impingement, right 05/24/2018  . Dehydration   . Intractable cyclical vomiting [id number]  . Pyloric stenosis in adult   . Thrombocytopenia ([place]) 12/20/2017  . Leukocytosis   . Malnutrition of moderate degree 12/17/2017  . Acute renal failure (ARF) ([place]) 12/15/2017  . Chronic abdominal pain 12/15/2017  . Gastroenteritis 06/14/2016  . Nausea with vomiting 06/10/2016  . Uterine enlargement 06/09/2016  . Intractable nausea and vomiting 06/08/2016  . Acute infective gastroenteritis 06/08/2016  . Diarrhea 06/08/2016  . Essential hypertension 06/08/2016  . GERD (gastroesophageal reflux disease) 06/08/2016  . Abdominal pain   . Endometrial polyp 04/15/2016  . PMB (postmenopausal bleeding) 02/27/2016  . Alcoholic cirrhosis of liver without ascites ([place])   . Asthma 01/21/2016  . Hepatic cirrhosis ([place]) 09/22/2015  . Arthritis of knee, degenerative 02/05/2015  . History of Helicobacter pylori infection 10/30/2014  . Chronic [name] with cirrhosis ([place]) 04/11/2014  . De Quervain's disease (radial styloid tenosynovitis) 12/25/2013  . Anorexia 11/21/2012  . FH: colon cancer 11/21/2012  . Early satiety 10/25/2012  . Bowel habit changes 10/25/2012  . Abdominal pain, epigastric 10/25/2012  . Abdominal bloating 10/25/2012  . Constipation 10/25/2012  . Abnormal weight loss 10/25/2012  . Chronic viral [name] ([place]) 10/25/2012  . Radicular pain of left lower extremity 09/28/2012  . Back pain 09/28/2012  . Sciatica 08/10/2011  . S/P arthroscopy of left knee 08/10/2011  . Tibial plateau fracture 08/10/2011  . Pain in joint, lower leg 02/12/2011  . Stiffness of joint, not elsewhere classified, lower leg 02/12/2011  . 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 Meniscus, medial, derangement 12/29/2010  . CLOSED FRACTURE OF UPPER END OF TIBIA 08/12/2010   Guadelupe Sabin, OTR/L   [phone] 04/26/2019, 10:29 AM  [facility] [address] [place], [place], [address] Phone: [phone]   Fax:  [phone]  Name: Colleen Lee MRN: [id number] Date of Birth: [birth year]

## 2019-05-01 ENCOUNTER — Encounter (HOSPITAL_COMMUNITY): Payer: Self-pay | Admitting: Occupational Therapy

## 2019-05-01 ENCOUNTER — Other Ambulatory Visit: Payer: Self-pay | Admitting: Orthopedic Surgery

## 2019-05-01 ENCOUNTER — Other Ambulatory Visit: Payer: Self-pay

## 2019-05-01 ENCOUNTER — Ambulatory Visit (HOSPITAL_COMMUNITY): Payer: Medicare Other | Admitting: Occupational Therapy

## 2019-05-01 DIAGNOSIS — M25611 Stiffness of right shoulder, not elsewhere classified: Secondary | ICD-10-CM

## 2019-05-01 DIAGNOSIS — G8918 Other acute postprocedural pain: Secondary | ICD-10-CM

## 2019-05-01 DIAGNOSIS — M25511 Pain in right shoulder: Secondary | ICD-10-CM | POA: Diagnosis not present

## 2019-05-01 DIAGNOSIS — R29898 Other symptoms and signs involving the musculoskeletal system: Secondary | ICD-10-CM

## 2019-05-01 MED ORDER — HYDROCODONE-ACETAMINOPHEN 5-325 MG PO TABS: 1.0000 | ORAL_TABLET | Freq: Four times a day (QID) | ORAL | 0 refills | Status: DC | PRN

## 2019-05-01 NOTE — Telephone Encounter (Signed)
Patient called for refill:  HYDROcodone-acetaminophen (NORCO/VICODIN) 5-325 MG tablet  - [employer]

## 2019-05-01 NOTE — Therapy (Signed)
[place], [place], [address] Phone: [phone]   Fax:  [phone]  Occupational Therapy Treatment  Patient Details  Name: Colleen Lee MRN: [id number] Date of Birth: 09/26/1955 Referring Provider (OT): Dr. Arther Abbott   Encounter Date: 05/01/2019  OT End of Session - 05/01/19 1155    Visit Number  13    Number of Visits  16    Date for OT Re-Evaluation  05/15/19    Authorization Type  1) [employer] Medicare 2) Medicaid    Authorization Time Period  visits based on medical necessity. progress note on visit 19.    Authorization - Visit Number  13    Authorization - Number of Visits  19    OT Start Time  [id number]    OT Stop Time  1156    OT Time Calculation (min)  38 min    Activity Tolerance  Patient tolerated treatment well    Behavior During Therapy  WFL for tasks assessed/performed       Past Medical History:  Diagnosis Date  . Asthma   . Chronic abdominal pain   . Chronic back pain   . Chronic constipation   . Cirrhosis ([place])    Metavir score F4 on elastography 2015  . Cyclical vomiting   . Fibroids   . Fibromyalgia   . GERD (gastroesophageal reflux disease)   . H. pylori infection 2014   treated with pylera, had to be treated again as it was not eradicated. Urea breath test then negative after subsequent treatment.   . [name]    HCV RNA positive 09/2012  . Hypertension   . Marijuana use   . Nausea and vomiting    chronic, recurrent  . PONV (postoperative nausea and vomiting)    pt thinks maybe once she had N&V  . Sciatica of left side     Past Surgical History:  Procedure Laterality Date  . BALLOON DILATION  12/20/2017   Procedure: BALLOON DILATION;  Surgeon: Danie Binder, MD;  [facility];  Service: Endoscopy;;  pyloric dilation  . BIOPSY  12/20/2017   Procedure: BIOPSY;  Surgeon: Danie Binder, MD;  [facility];  Service: Endoscopy;;  duodenal and gastric  biopsy  . COLONOSCOPY  01/18/2008   [id number] rectum.  Long redundant colon, a diminutive sigmoid polyp status post cold biopsy removed. Hyperplastic polyp. Repeat colonoscopy June 2014 due to family history of colon cancer  . COLONOSCOPY WITH ESOPHAGOGASTRODUODENOSCOPY (EGD) N/A 11/02/2012   [id number] gastric mucosa of doubtful, +H.pylori. Incomplete colonoscopy due to patient unable to tolerate exam, proximal colon seen. Patient refused ACBE.  Marland Kitchen COLONOSCOPY WITH PROPOFOL N/A 03/08/2013   Dr. Gala Romney: colonic polyp-removed as scribed above. Internal Hemorrhoids. Pathology did not reveal any colonic tissue, only mucus. SURVEILLANCE DUE Aug 2019  . COLONOSCOPY WITH PROPOFOL N/A 01/19/2018   Dr. Gala Romney: 6 mm cecal inflammatoy polyp, otherwise normal., Surveilance in 5 year s  . ESOPHAGEAL DILATION  12/20/2017   Procedure: ESOPHAGEAL DILATION;  Surgeon: Danie Binder, MD;  [facility];  Service: Endoscopy;;  . ESOPHAGOGASTRODUODENOSCOPY  01/18/2008   RMR: Normal esophagus, normal  stomach  . ESOPHAGOGASTRODUODENOSCOPY (EGD) WITH PROPOFOL N/A 02/09/2016   Normal esophagus, small hiatal hernia, portal hypertensive gastropathy, normal second portion of duodenum. Due in July 2019  . ESOPHAGOGASTRODUODENOSCOPY (EGD) WITH PROPOFOL N/A 12/20/2017   Dr. Oneida Alar: benign appearing esophageal stenosis s/p dilation, mild pyloric stenosis s/p  biopsy and dilation, mild duodenitis.   Marland Kitchen HYSTEROSCOPY  05/11/2016   Procedure: HYSTEROSCOPY;  Surgeon: Jonnie Kind, MD;  Location: [facility];  Service: Gynecology;;  . KNEE SURGERY     left knee  . MULTIPLE EXTRACTIONS WITH ALVEOLOPLASTY N/A 10/15/2013   Procedure: MULTIPLE EXTRACTION WITH ALVEOLOPLASTY;  Surgeon: Gae Bon, DDS;  Location: [facility];  Service: Oral Surgery;  Laterality: N/A;  . POLYPECTOMY N/A 03/08/2013   Procedure: POLYPECTOMY;  Surgeon: Daneil Dolin, MD;  Location: [facility];  Service: Endoscopy;  Laterality: N/A;  . POLYPECTOMY N/A 05/11/2016    Procedure: REMOVAL OF ENDOMETRIAL POLYP;  Surgeon: Jonnie Kind, MD;  Location: [facility];  Service: Gynecology;  Laterality: N/A;  . RESECTION DISTAL CLAVICAL Right 01/30/2019   Procedure: RESECTION DISTAL CLAVICAL;  Surgeon: Carole Civil, MD;  Location: [facility];  Service: Orthopedics;  Laterality: Right;  . SHOULDER OPEN ROTATOR CUFF REPAIR Right 01/30/2019   Procedure: ROTATOR CUFF REPAIR SHOULDER OPEN;  Surgeon: Carole Civil, MD;  Location: [facility];  Service: Orthopedics;  Laterality: Right;  pt to arrive at 7:30 for PICC at 8:00  . TOTAL KNEE ARTHROPLASTY Left 02/05/2015   Procedure: LEFT TOTAL KNEE ARTHROPLASTY;  Surgeon: Carole Civil, MD;  Location: [facility];  Service: Orthopedics;  Laterality: Left;    There were no vitals filed for this visit.  Subjective Assessment - 05/01/19 1120    Subjective   S: It's probably going to ache this evening with the rain.    Currently in Pain?  No/denies         [facility] OT Assessment - 05/01/19 1120      Assessment   Medical Diagnosis  s/p Right Open RCR      Precautions   Precautions  Shoulder    Type of Shoulder Precautions   AA/ROM and progress as tolerated unless MD states otherwise               OT Treatments/Exercises (OP) - 05/01/19 1132      Exercises   Exercises  Shoulder      Shoulder Exercises: Supine   Protraction  PROM;5 reps;AROM;10 reps    Horizontal ABduction  PROM;5 reps;AROM;10 reps    External Rotation  PROM;5 reps;AROM;10 reps    Internal Rotation  PROM;5 reps;AROM;10 reps    Flexion  PROM;5 reps;AROM;10 reps    ABduction  PROM;5 reps;AROM;10 reps      Shoulder Exercises: Seated   Protraction  AROM;10 reps    Horizontal ABduction  AROM;10 reps    External Rotation  AROM;10 reps    Internal Rotation  AROM;10 reps    Flexion  AROM;10 reps    Abduction  AROM;10 reps      Shoulder Exercises: Standing   Extension  Theraband;10 reps    Theraband Level (Shoulder Extension)  Level 2 (Red)     Row  Theraband;10 reps    Theraband Level (Shoulder Row)  Level 2 (Red)      Shoulder Exercises: ROM/Strengthening   Wall Wash  1'    Proximal Shoulder Strengthening, Supine  10X A/ROM with no rest breaks. Verbal and visual cues to complete    Proximal Shoulder Strengthening, Seated  10X A/ROM with verbal and visual cuing    Other ROM/Strengthening Exercises  proximal shoulder strengthening on doorway, flexion, 1'       Functional Reaching Activities   High Level  Pt placed and removed 10 cones from top shelf of cabinet, min  difficulty.      Manual Therapy   Manual Therapy  Myofascial release    Manual therapy comments  completed separately from therapeutic exercises    Myofascial Release  myofascial release and manual therapy complete to right upper arm, trapezius, and scapular regions to decrease fascial restrictions and improve pain and ROM               OT Short Term Goals - 04/16/19 1132      OT SHORT TERM GOAL #1   Title  Patient will be educated and independent with HEP to faciliate progress in therapy and allow her to return to using her RUE as her dominant extremity.     Time  4    Period  Weeks    Status  On-going    Target Date  04/15/19      OT SHORT TERM GOAL #2   Title  Patient will increase RUE P/ROM to WNL in order to increase ability to complete dressing tasks with less difficulty.     Time  4    Period  Weeks      OT SHORT TERM GOAL #3   Title  Patient will report a decrease in pain level of 5/10 to improve ability to sleep in the bed vs the couch.    Time  4    Period  Weeks        OT Long Term Goals - 04/16/19 1132      OT LONG TERM GOAL #1   Title  Patient will increase her RUE A/ROM to WNL to improve ability to reach into overhead cabinets.    Time  8    Period  Weeks    Status  On-going      OT LONG TERM GOAL #2   Title  Patient will increase her RUE strength to 4+/5 to improve ability to perform gardening tasks using RUE as  dominant.    Time  8    Period  Weeks    Status  On-going      OT LONG TERM GOAL #3   Title  Patient will decrease her fascial restrictions to min amount or less in order to increase mobility required to complete functional reaching tasks.    Time  8    Period  Weeks    Status  On-going      OT LONG TERM GOAL #4   Title  Patient will report a decrease in pain of approximately 3/10 or less in her RUE while completing household tasks with her right arm.    Time  8    Period  Weeks    Status  On-going      OT LONG TERM GOAL #5   Title  Pt will return to highest level of functioning and independence using RUE as dominant.    Time  8    Period  Weeks    Status  On-going            Plan - 05/01/19 1141    Clinical Impression Statement  A: Continued with manual therapy to address fascial restrictions in RUE. Progressed to A/ROM as pt's form improves without dowel rod, rest breaks provided as needed for fatigue. Added red scapular theraband and functioinal reaching this session. Verbal cuing for form and technique.    Body Structure / Function / Physical Skills  ADL;UE functional use;Fascial restriction;Pain;ROM;IADL;Strength    Plan  P: Continue with A/ROM, add retraction with theraband, add UBE  Patient will benefit from skilled therapeutic intervention in order to improve the following deficits and impairments:   Body Structure / Function / Physical Skills: ADL, UE functional use, Fascial restriction, Pain, ROM, IADL, Strength       Visit Diagnosis: Stiffness of right shoulder, not elsewhere classified  Other symptoms and signs involving the musculoskeletal system  Acute pain of right shoulder    Problem List Patient Active Problem List   Diagnosis Date Noted  . S/P right rotator cuff repair 01/30/19 02/06/2019  . Nontraumatic complete tear of right rotator cuff   . Arthritis of right acromioclavicular joint   . S/P total knee replacement, 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  05/24/2018  . Shoulder impingement, right 05/24/2018  . Dehydration   . Intractable cyclical vomiting [id number]  . Pyloric stenosis in adult   . Thrombocytopenia ([place]) 12/20/2017  . Leukocytosis   . Malnutrition of moderate degree 12/17/2017  . Acute renal failure (ARF) ([place]) 12/15/2017  . Chronic abdominal pain 12/15/2017  . Gastroenteritis 06/14/2016  . Nausea with vomiting 06/10/2016  . Uterine enlargement 06/09/2016  . Intractable nausea and vomiting 06/08/2016  . Acute infective gastroenteritis 06/08/2016  . Diarrhea 06/08/2016  . Essential hypertension 06/08/2016  . GERD (gastroesophageal reflux disease) 06/08/2016  . Abdominal pain   . Endometrial polyp 04/15/2016  . PMB (postmenopausal bleeding) 02/27/2016  . Alcoholic cirrhosis of liver without ascites ([place])   . Asthma 01/21/2016  . Hepatic cirrhosis ([place]) 09/22/2015  . Arthritis of knee, degenerative 02/05/2015  . History of Helicobacter pylori infection 10/30/2014  . Chronic [name] with cirrhosis ([place]) 04/11/2014  . De Quervain's disease (radial styloid tenosynovitis) 12/25/2013  . Anorexia 11/21/2012  . FH: colon cancer 11/21/2012  . Early satiety 10/25/2012  . Bowel habit changes 10/25/2012  . Abdominal pain, epigastric 10/25/2012  . Abdominal bloating 10/25/2012  . Constipation 10/25/2012  . Abnormal weight loss 10/25/2012  . Chronic viral [name] ([place]) 10/25/2012  . Radicular pain of left lower extremity 09/28/2012  . Back pain 09/28/2012  . Sciatica 08/10/2011  . S/P arthroscopy of left knee 08/10/2011  . Tibial plateau fracture 08/10/2011  . Pain in joint, lower leg 02/12/2011  . Stiffness of joint, not elsewhere classified, lower leg 02/12/2011  . 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 Meniscus, medial, derangement 12/29/2010  . CLOSED FRACTURE OF UPPER END OF TIBIA 08/12/2010   Guadelupe Sabin, OTR/L  [phone] 05/01/2019, 11:56 AM  [facility] [address] [place], [place], [address] Phone: [phone]   Fax:  [phone]  Name: Colleen Lee MRN: [id number] Date of Birth: 11/28/1955

## 2019-05-02 ENCOUNTER — Other Ambulatory Visit: Payer: Self-pay | Admitting: Orthopedic Surgery

## 2019-05-02 DIAGNOSIS — G8918 Other acute postprocedural pain: Secondary | ICD-10-CM

## 2019-05-02 MED ORDER — HYDROCODONE-ACETAMINOPHEN 5-325 MG PO TABS: 1.0000 | ORAL_TABLET | Freq: Four times a day (QID) | ORAL | 0 refills | Status: DC | PRN

## 2019-05-02 NOTE — Telephone Encounter (Signed)
Patient called this morning stating that she spoke to [place] and told her they do not have a refill on the Hydrocodone/Acetaminophen 5-325 from Dr. Aline Brochure.    Can you follow up on this for her please?

## 2019-05-02 NOTE — Telephone Encounter (Signed)
Colleen Lee states pharmacy did not receive the Rx, can you send in again?

## 2019-05-02 NOTE — Telephone Encounter (Signed)
Was sent yesterday to [facility].

## 2019-05-03 ENCOUNTER — Ambulatory Visit (HOSPITAL_COMMUNITY): Payer: Medicare Other | Attending: Orthopedic Surgery | Admitting: Occupational Therapy

## 2019-05-03 ENCOUNTER — Other Ambulatory Visit: Payer: Self-pay

## 2019-05-03 ENCOUNTER — Encounter (HOSPITAL_COMMUNITY): Payer: Self-pay | Admitting: Occupational Therapy

## 2019-05-03 DIAGNOSIS — M25511 Pain in right shoulder: Secondary | ICD-10-CM | POA: Diagnosis not present

## 2019-05-03 DIAGNOSIS — R29898 Other symptoms and signs involving the musculoskeletal system: Secondary | ICD-10-CM | POA: Insufficient documentation

## 2019-05-03 DIAGNOSIS — M25611 Stiffness of right shoulder, not elsewhere classified: Secondary | ICD-10-CM | POA: Diagnosis not present

## 2019-05-03 NOTE — Patient Instructions (Signed)
Repeat all exercises 10-15 times, 1-2 times per day.  1) Shoulder Protraction    Begin with elbows by your side, slowly "punch" straight out in front of you.      2) Shoulder Flexion  Supine:     Standing:         Begin with arms at your side with thumbs pointed up, slowly raise both arms up and forward towards overhead.               3) Horizontal abduction/adduction  Supine:   Standing:           Begin with arms straight out in front of you, bring out to the side in at "T" shape. Keep arms straight entire time.                 4) Internal & External Rotation    *No band* -Stand with elbows at the side and elbows bent 90 degrees. Move your forearms away from your body, then bring back inward toward the body.     5) Shoulder Abduction  Supine:     Standing:       Lying on your back begin with your arms flat on the table next to your side. Slowly move your arms out to the side so that they go overhead, in a jumping jack or snow angel movement.      

## 2019-05-03 NOTE — Therapy (Signed)
[place], [place], [address] Phone: [phone]   Fax:  [phone]  Occupational Therapy Treatment  Patient Details  Name: Colleen Lee MRN: [id number] Date of Birth: 04/27/1956 Referring Provider (OT): Dr. Arther Abbott   Encounter Date: 05/03/2019  OT End of Session - 05/03/19 1156    Visit Number  14    Number of Visits  16    Date for OT Re-Evaluation  05/15/19    Authorization Type  1) [employer] Medicare 2) Medicaid    Authorization Time Period  visits based on medical necessity. progress note on visit 19.    Authorization - Visit Number  14    Authorization - Number of Visits  19    OT Start Time  1119    OT Stop Time  1157    OT Time Calculation (min)  38 min    Activity Tolerance  Patient tolerated treatment well    Behavior During Therapy  WFL for tasks assessed/performed       Past Medical History:  Diagnosis Date  . Asthma   . Chronic abdominal pain   . Chronic back pain   . Chronic constipation   . Cirrhosis ([place])    Metavir score F4 on elastography 2015  . Cyclical vomiting   . Fibroids   . Fibromyalgia   . GERD (gastroesophageal reflux disease)   . H. pylori infection 2014   treated with pylera, had to be treated again as it was not eradicated. Urea breath test then negative after subsequent treatment.   . [name]    HCV RNA positive 09/2012  . Hypertension   . Marijuana use   . Nausea and vomiting    chronic, recurrent  . PONV (postoperative nausea and vomiting)    pt thinks maybe once she had N&V  . Sciatica of left side     Past Surgical History:  Procedure Laterality Date  . BALLOON DILATION  12/20/2017   Procedure: BALLOON DILATION;  Surgeon: Danie Binder, MD;  [facility];  Service: Endoscopy;;  pyloric dilation  . BIOPSY  12/20/2017   Procedure: BIOPSY;  Surgeon: Danie Binder, MD;  [facility];  Service: Endoscopy;;  duodenal and gastric  biopsy  . COLONOSCOPY  01/18/2008   [id number] rectum.  Long redundant colon, a diminutive sigmoid polyp status post cold biopsy removed. Hyperplastic polyp. Repeat colonoscopy June 2014 due to family history of colon cancer  . COLONOSCOPY WITH ESOPHAGOGASTRODUODENOSCOPY (EGD) N/A 11/02/2012   [id number] gastric mucosa of doubtful, +H.pylori. Incomplete colonoscopy due to patient unable to tolerate exam, proximal colon seen. Patient refused ACBE.  Marland Kitchen COLONOSCOPY WITH PROPOFOL N/A 03/08/2013   Dr. Gala Romney: colonic polyp-removed as scribed above. Internal Hemorrhoids. Pathology did not reveal any colonic tissue, only mucus. SURVEILLANCE DUE Aug 2019  . COLONOSCOPY WITH PROPOFOL N/A 01/19/2018   Dr. Gala Romney: 6 mm cecal inflammatoy polyp, otherwise normal., Surveilance in 5 year s  . ESOPHAGEAL DILATION  12/20/2017   Procedure: ESOPHAGEAL DILATION;  Surgeon: Danie Binder, MD;  [facility];  Service: Endoscopy;;  . ESOPHAGOGASTRODUODENOSCOPY  01/18/2008   RMR: Normal esophagus, normal  stomach  . ESOPHAGOGASTRODUODENOSCOPY (EGD) WITH PROPOFOL N/A 02/09/2016   Normal esophagus, small hiatal hernia, portal hypertensive gastropathy, normal second portion of duodenum. Due in July 2019  . ESOPHAGOGASTRODUODENOSCOPY (EGD) WITH PROPOFOL N/A 12/20/2017   Dr. Oneida Alar: benign appearing esophageal stenosis s/p dilation, mild pyloric stenosis s/p  biopsy and dilation, mild duodenitis.   Marland Kitchen HYSTEROSCOPY  05/11/2016   Procedure: HYSTEROSCOPY;  Surgeon: Jonnie Kind, MD;  Location: [facility];  Service: Gynecology;;  . KNEE SURGERY     left knee  . MULTIPLE EXTRACTIONS WITH ALVEOLOPLASTY N/A 10/15/2013   Procedure: MULTIPLE EXTRACTION WITH ALVEOLOPLASTY;  Surgeon: Gae Bon, DDS;  Location: [facility];  Service: Oral Surgery;  Laterality: N/A;  . POLYPECTOMY N/A 03/08/2013   Procedure: POLYPECTOMY;  Surgeon: Daneil Dolin, MD;  Location: [facility];  Service: Endoscopy;  Laterality: N/A;  . POLYPECTOMY N/A 05/11/2016    Procedure: REMOVAL OF ENDOMETRIAL POLYP;  Surgeon: Jonnie Kind, MD;  Location: [facility];  Service: Gynecology;  Laterality: N/A;  . RESECTION DISTAL CLAVICAL Right 01/30/2019   Procedure: RESECTION DISTAL CLAVICAL;  Surgeon: Carole Civil, MD;  Location: [facility];  Service: Orthopedics;  Laterality: Right;  . SHOULDER OPEN ROTATOR CUFF REPAIR Right 01/30/2019   Procedure: ROTATOR CUFF REPAIR SHOULDER OPEN;  Surgeon: Carole Civil, MD;  Location: [facility];  Service: Orthopedics;  Laterality: Right;  pt to arrive at 7:30 for PICC at 8:00  . TOTAL KNEE ARTHROPLASTY Left 02/05/2015   Procedure: LEFT TOTAL KNEE ARTHROPLASTY;  Surgeon: Carole Civil, MD;  Location: [facility];  Service: Orthopedics;  Laterality: Left;    There were no vitals filed for this visit.  Subjective Assessment - 05/03/19 1118    Subjective   S: It might be hurting because I changed my room around yesterday.    Currently in Pain?  Yes    Pain Score  7     Pain Location  Shoulder    Pain Orientation  Right    Pain Descriptors / Indicators  Sore    Pain Type  Acute pain    Pain Radiating Towards  n/a    Pain Onset  More than a month ago    Pain Frequency  Constant    Aggravating Factors   weather    Pain Relieving Factors  rest, heat    Effect of Pain on Daily Activities  mod effect on ADLs    Multiple Pain Sites  No         [facility] OT Assessment - 05/03/19 1118      Assessment   Medical Diagnosis  s/p Right Open RCR      Precautions   Precautions  Shoulder    Type of Shoulder Precautions   AA/ROM and progress as tolerated unless MD states otherwise               OT Treatments/Exercises (OP) - 05/03/19 1120      Exercises   Exercises  Shoulder      Shoulder Exercises: Supine   Protraction  PROM;5 reps;AROM;10 reps    Horizontal ABduction  PROM;5 reps;AROM;10 reps    External Rotation  PROM;5 reps;AROM;10 reps    Internal Rotation  PROM;5 reps;AROM;10 reps    Flexion  PROM;5  reps;AROM;10 reps    ABduction  PROM;5 reps;AROM;10 reps      Shoulder Exercises: Seated   Protraction  AROM;10 reps    Horizontal ABduction  AROM;10 reps    External Rotation  AROM;10 reps    Internal Rotation  AROM;10 reps    Flexion  AROM;10 reps    Abduction  AROM;10 reps      Shoulder Exercises: Standing   Extension  Theraband;10 reps    Theraband Level (Shoulder Extension)  Level 2 (Red)  Row  Theraband;10 reps    Theraband Level (Shoulder Row)  Level 2 (Red)    Retraction  Theraband;10 reps    Theraband Level (Shoulder Retraction)  Level 2 (Red)      Shoulder Exercises: ROM/Strengthening   UBE (Upper Arm Bike)  Level 1 2' forward 2' reverse   pace: 4.0-5.0   X to V Arms  10X    Proximal Shoulder Strengthening, Supine  10X A/ROM with no rest breaks. Verbal and visual cues to complete    Proximal Shoulder Strengthening, Seated  10X A/ROM with verbal and visual cuing    Other ROM/Strengthening Exercises  ball pass using tennis ball working on IR behind back, 10X    Other ROM/Strengthening Exercises  proximal shoulder strengthening on doorway, flexion, 1'       Manual Therapy   Manual Therapy  Myofascial release    Manual therapy comments  completed separately from therapeutic exercises    Myofascial Release  myofascial release and manual therapy complete to right upper arm, trapezius, and scapular regions to decrease fascial restrictions and improve pain and ROM             OT Education - 05/03/19 1144    Education Details  A/ROM exercises    Person(s) Educated  Patient    Methods  Explanation;Demonstration    Comprehension  Verbalized understanding;Returned demonstration       OT Short Term Goals - 04/16/19 1132      OT SHORT TERM GOAL #1   Title  Patient will be educated and independent with HEP to faciliate progress in therapy and allow her to return to using her RUE as her dominant extremity.     Time  4    Period  Weeks    Status  On-going    Target  Date  04/15/19      OT SHORT TERM GOAL #2   Title  Patient will increase RUE P/ROM to WNL in order to increase ability to complete dressing tasks with less difficulty.     Time  4    Period  Weeks      OT SHORT TERM GOAL #3   Title  Patient will report a decrease in pain level of 5/10 to improve ability to sleep in the bed vs the couch.    Time  4    Period  Weeks        OT Long Term Goals - 04/16/19 1132      OT LONG TERM GOAL #1   Title  Patient will increase her RUE A/ROM to WNL to improve ability to reach into overhead cabinets.    Time  8    Period  Weeks    Status  On-going      OT LONG TERM GOAL #2   Title  Patient will increase her RUE strength to 4+/5 to improve ability to perform gardening tasks using RUE as dominant.    Time  8    Period  Weeks    Status  On-going      OT LONG TERM GOAL #3   Title  Patient will decrease her fascial restrictions to min amount or less in order to increase mobility required to complete functional reaching tasks.    Time  8    Period  Weeks    Status  On-going      OT LONG TERM GOAL #4   Title  Patient will report a decrease in pain of approximately 3/10 or  less in her RUE while completing household tasks with her right arm.    Time  8    Period  Weeks    Status  On-going      OT LONG TERM GOAL #5   Title  Pt will return to highest level of functioning and independence using RUE as dominant.    Time  8    Period  Weeks    Status  On-going            Plan - 05/03/19 1154    Clinical Impression Statement  A: Pt reports using her RUE a lot yesterday and is sore today. Continued with A/ROM in supine and standing, adding x to v arms today. Continued with scapular theraband adding retraction, verbal/visual/tactile cuing for correct form. Added UBE, mod fatigue at end of session. Verbal and visual cuing for form and technique.    Body Structure / Function / Physical Skills  ADL;UE functional use;Fascial  restriction;Pain;ROM;IADL;Strength    Plan  P: Follow up on HEP, add overhead lacing       Patient will benefit from skilled therapeutic intervention in order to improve the following deficits and impairments:   Body Structure / Function / Physical Skills: ADL, UE functional use, Fascial restriction, Pain, ROM, IADL, Strength       Visit Diagnosis: Other symptoms and signs involving the musculoskeletal system  Acute pain of right shoulder    Problem List Patient Active Problem List   Diagnosis Date Noted  . S/P right rotator cuff repair 01/30/19 02/06/2019  . Nontraumatic complete tear of right rotator cuff   . Arthritis of right acromioclavicular joint   . S/P total knee replacement, 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/24/2018  . Shoulder impingement, right 05/24/2018  . Dehydration   . Intractable cyclical vomiting [id number]  . Pyloric stenosis in adult   . Thrombocytopenia ([place]) 12/20/2017  . Leukocytosis   . Malnutrition of moderate degree 12/17/2017  . Acute renal failure (ARF) ([place]) 12/15/2017  . Chronic abdominal pain 12/15/2017  . Gastroenteritis 06/14/2016  . Nausea with vomiting 06/10/2016  . Uterine enlargement 06/09/2016  . Intractable nausea and vomiting 06/08/2016  . Acute infective gastroenteritis 06/08/2016  . Diarrhea 06/08/2016  . Essential hypertension 06/08/2016  . GERD (gastroesophageal reflux disease) 06/08/2016  . Abdominal pain   . Endometrial polyp 04/15/2016  . PMB (postmenopausal bleeding) 02/27/2016  . Alcoholic cirrhosis of liver without ascites ([place])   . Asthma 01/21/2016  . Hepatic cirrhosis ([place]) 09/22/2015  . Arthritis of knee, degenerative 02/05/2015  . History of Helicobacter pylori infection 10/30/2014  . Chronic [name] with cirrhosis ([place]) 04/11/2014  . De Quervain's disease (radial styloid tenosynovitis) 12/25/2013  . Anorexia 11/21/2012  . FH: colon cancer 11/21/2012  . Early satiety 10/25/2012  . Bowel habit changes 10/25/2012  .  Abdominal pain, epigastric 10/25/2012  . Abdominal bloating 10/25/2012  . Constipation 10/25/2012  . Abnormal weight loss 10/25/2012  . Chronic viral [name] ([place]) 10/25/2012  . Radicular pain of left lower extremity 09/28/2012  . Back pain 09/28/2012  . Sciatica 08/10/2011  . S/P arthroscopy of left knee 08/10/2011  . Tibial plateau fracture 08/10/2011  . Pain in joint, lower leg 02/12/2011  . Stiffness of joint, not elsewhere classified, lower leg 02/12/2011  . 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 Meniscus, medial, derangement 12/29/2010  . CLOSED FRACTURE OF UPPER END OF TIBIA 08/12/2010   Guadelupe Sabin, OTR/L  [phone] 05/03/2019, 12:01 PM  [place]  [address] [place], [place], [address] Phone: [phone]   Fax:  [phone]  Name: LATOYAH MCPETERS MRN: [id number] Date of Birth: August 09, 1955

## 2019-05-04 DIAGNOSIS — J449 Chronic obstructive pulmonary disease, unspecified: Secondary | ICD-10-CM | POA: Diagnosis not present

## 2019-05-08 ENCOUNTER — Other Ambulatory Visit: Payer: Self-pay

## 2019-05-08 ENCOUNTER — Encounter (HOSPITAL_COMMUNITY): Payer: Self-pay

## 2019-05-08 ENCOUNTER — Ambulatory Visit (HOSPITAL_COMMUNITY): Payer: Medicare Other

## 2019-05-08 DIAGNOSIS — M25511 Pain in right shoulder: Secondary | ICD-10-CM | POA: Diagnosis not present

## 2019-05-08 DIAGNOSIS — G8918 Other acute postprocedural pain: Secondary | ICD-10-CM

## 2019-05-08 DIAGNOSIS — M25611 Stiffness of right shoulder, not elsewhere classified: Secondary | ICD-10-CM

## 2019-05-08 DIAGNOSIS — R29898 Other symptoms and signs involving the musculoskeletal system: Secondary | ICD-10-CM | POA: Diagnosis not present

## 2019-05-08 MED ORDER — HYDROCODONE-ACETAMINOPHEN 5-325 MG PO TABS: 1.0000 | ORAL_TABLET | Freq: Four times a day (QID) | ORAL | 0 refills | Status: DC | PRN

## 2019-05-08 NOTE — Telephone Encounter (Signed)
Hydrocodone-Acetaminophen  5/325 mg  Qty 28 Tablets ° °Take 1 tablet by mouth every 6 (six) hours as needed for up to 7 days for moderate pain. ° °PATIENT USES [facility] °

## 2019-05-08 NOTE — Therapy (Signed)
[facility] [place], [place], [address] Phone: [phone]   Fax:  [phone]  Occupational Therapy Treatment  Patient Details  Name: Colleen Lee MRN: [id number] Date of Birth: 06-25-1956 Referring Provider (OT): Dr. Arther Abbott   Encounter Date: 05/08/2019  OT End of Session - 05/08/19 1144    Visit Number  15    Number of Visits  16    Date for OT Re-Evaluation  05/15/19    Authorization Type  1) [employer] Medicare 2) Medicaid    Authorization Time Period  visits based on medical necessity. progress note on visit 19.    Authorization - Visit Number  15    Authorization - Number of Visits  19    OT Start Time  1113    OT Stop Time  1151    OT Time Calculation (min)  38 min    Activity Tolerance  Patient tolerated treatment well    Behavior During Therapy  WFL for tasks assessed/performed       Past Medical History:  Diagnosis Date  . Asthma   . Chronic abdominal pain   . Chronic back pain   . Chronic constipation   . Cirrhosis ([place])    Metavir score F4 on elastography 2015  . Cyclical vomiting   . Fibroids   . Fibromyalgia   . GERD (gastroesophageal reflux disease)   . H. pylori infection 2014   treated with pylera, had to be treated again as it was not eradicated. Urea breath test then negative after subsequent treatment.   . [name]    HCV RNA positive 09/2012  . Hypertension   . Marijuana use   . Nausea and vomiting    chronic, recurrent  . PONV (postoperative nausea and vomiting)    pt thinks maybe once she had N&V  . Sciatica of left side     Past Surgical History:  Procedure Laterality Date  . BALLOON DILATION  12/20/2017   Procedure: BALLOON DILATION;  Surgeon: Danie Binder, MD;  [facility];  Service: Endoscopy;;  pyloric dilation  . BIOPSY  12/20/2017   Procedure: BIOPSY;  Surgeon: Danie Binder, MD;  [facility];  Service: Endoscopy;;  duodenal and gastric  biopsy  . COLONOSCOPY  01/18/2008   [id number] rectum.  Long redundant colon, a diminutive sigmoid polyp status post cold biopsy removed. Hyperplastic polyp. Repeat colonoscopy June 2014 due to family history of colon cancer  . COLONOSCOPY WITH ESOPHAGOGASTRODUODENOSCOPY (EGD) N/A 11/02/2012   [id number] gastric mucosa of doubtful, +H.pylori. Incomplete colonoscopy due to patient unable to tolerate exam, proximal colon seen. Patient refused ACBE.  Marland Kitchen COLONOSCOPY WITH PROPOFOL N/A 03/08/2013   Dr. Gala Romney: colonic polyp-removed as scribed above. Internal Hemorrhoids. Pathology did not reveal any colonic tissue, only mucus. SURVEILLANCE DUE Aug 2019  . COLONOSCOPY WITH PROPOFOL N/A 01/19/2018   Dr. Gala Romney: 6 mm cecal inflammatoy polyp, otherwise normal., Surveilance in 5 year s  . ESOPHAGEAL DILATION  12/20/2017   Procedure: ESOPHAGEAL DILATION;  Surgeon: Danie Binder, MD;  [facility];  Service: Endoscopy;;  . ESOPHAGOGASTRODUODENOSCOPY  01/18/2008   RMR: Normal esophagus, normal  stomach  . ESOPHAGOGASTRODUODENOSCOPY (EGD) WITH PROPOFOL N/A 02/09/2016   Normal esophagus, small hiatal hernia, portal hypertensive gastropathy, normal second portion of duodenum. Due in July 2019  . ESOPHAGOGASTRODUODENOSCOPY (EGD) WITH PROPOFOL N/A 12/20/2017   Dr. Oneida Alar: benign appearing esophageal stenosis s/p dilation, mild pyloric stenosis s/p  biopsy and dilation, mild duodenitis.   Marland Kitchen HYSTEROSCOPY  05/11/2016   Procedure: HYSTEROSCOPY;  Surgeon: Jonnie Kind, MD;  Location: [facility];  Service: Gynecology;;  . KNEE SURGERY     left knee  . MULTIPLE EXTRACTIONS WITH ALVEOLOPLASTY N/A 10/15/2013   Procedure: MULTIPLE EXTRACTION WITH ALVEOLOPLASTY;  Surgeon: Gae Bon, DDS;  Location: [facility];  Service: Oral Surgery;  Laterality: N/A;  . POLYPECTOMY N/A 03/08/2013   Procedure: POLYPECTOMY;  Surgeon: Daneil Dolin, MD;  Location: [facility];  Service: Endoscopy;  Laterality: N/A;  . POLYPECTOMY N/A 05/11/2016    Procedure: REMOVAL OF ENDOMETRIAL POLYP;  Surgeon: Jonnie Kind, MD;  Location: [facility];  Service: Gynecology;  Laterality: N/A;  . RESECTION DISTAL CLAVICAL Right 01/30/2019   Procedure: RESECTION DISTAL CLAVICAL;  Surgeon: Carole Civil, MD;  Location: [facility];  Service: Orthopedics;  Laterality: Right;  . SHOULDER OPEN ROTATOR CUFF REPAIR Right 01/30/2019   Procedure: ROTATOR CUFF REPAIR SHOULDER OPEN;  Surgeon: Carole Civil, MD;  Location: [facility];  Service: Orthopedics;  Laterality: Right;  pt to arrive at 7:30 for PICC at 8:00  . TOTAL KNEE ARTHROPLASTY Left 02/05/2015   Procedure: LEFT TOTAL KNEE ARTHROPLASTY;  Surgeon: Carole Civil, MD;  Location: [facility];  Service: Orthopedics;  Laterality: Left;    There were no vitals filed for this visit.  Subjective Assessment - 05/08/19 1126    Subjective   S: I'm still sore. I'm sorting things in my room and getting rid of stuff while I'm in a good mood.    Currently in Pain?  Yes    Pain Score  7     Pain Location  Shoulder    Pain Orientation  Right    Pain Descriptors / Indicators  Sore    Pain Type  Acute pain         [facility] OT Assessment - 05/08/19 1127      Assessment   Medical Diagnosis  s/p Right Open RCR      Precautions   Precautions  Shoulder    Type of Shoulder Precautions   AA/ROM and progress as tolerated unless MD states otherwise               OT Treatments/Exercises (OP) - 05/08/19 1127      Exercises   Exercises  Shoulder      Shoulder Exercises: Supine   Protraction  PROM;5 reps;AROM;10 reps    Horizontal ABduction  PROM;5 reps;AROM;10 reps    External Rotation  PROM;5 reps;AROM;10 reps    Internal Rotation  PROM;5 reps;AROM;10 reps    Flexion  PROM;5 reps;AROM;10 reps    ABduction  PROM;5 reps;AROM;10 reps      Shoulder Exercises: Seated   Protraction  AROM;10 reps    Horizontal ABduction  AROM;10 reps    External Rotation  AROM;10 reps    Internal Rotation  AROM;10 reps     Flexion  AROM;10 reps    Abduction  AROM;10 reps      Shoulder Exercises: ROM/Strengthening   UBE (Upper Arm Bike)  Level 1 2' forward 2' reverse   pace: 4.0-5.0   Over Head Lace  2' seated    X to V Arms  10X   standing against wall for form cue   Proximal Shoulder Strengthening, Supine  10X A/ROM with no rest breaks. Verbal and visual cues to complete    Proximal Shoulder Strengthening, Seated  10X A/ROM with verbal and visual  cuing    Other ROM/Strengthening Exercises  ball pass using tennis ball working on IR behind back, 10X      Manual Therapy   Manual Therapy  Myofascial release    Manual therapy comments  completed separately from therapeutic exercises    Myofascial Release  myofascial release and manual therapy complete to right upper arm, trapezius, and scapular regions to decrease fascial restrictions and improve pain and ROM               OT Short Term Goals - 04/16/19 1132      OT SHORT TERM GOAL #1   Title  Patient will be educated and independent with HEP to faciliate progress in therapy and allow her to return to using her RUE as her dominant extremity.     Time  4    Period  Weeks    Status  On-going    Target Date  04/15/19      OT SHORT TERM GOAL #2   Title  Patient will increase RUE P/ROM to WNL in order to increase ability to complete dressing tasks with less difficulty.     Time  4    Period  Weeks      OT SHORT TERM GOAL #3   Title  Patient will report a decrease in pain level of 5/10 to improve ability to sleep in the bed vs the couch.    Time  4    Period  Weeks        OT Long Term Goals - 04/16/19 1132      OT LONG TERM GOAL #1   Title  Patient will increase her RUE A/ROM to WNL to improve ability to reach into overhead cabinets.    Time  8    Period  Weeks    Status  On-going      OT LONG TERM GOAL #2   Title  Patient will increase her RUE strength to 4+/5 to improve ability to perform gardening tasks using RUE as dominant.     Time  8    Period  Weeks    Status  On-going      OT LONG TERM GOAL #3   Title  Patient will decrease her fascial restrictions to min amount or less in order to increase mobility required to complete functional reaching tasks.    Time  8    Period  Weeks    Status  On-going      OT LONG TERM GOAL #4   Title  Patient will report a decrease in pain of approximately 3/10 or less in her RUE while completing household tasks with her right arm.    Time  8    Period  Weeks    Status  On-going      OT LONG TERM GOAL #5   Title  Pt will return to highest level of functioning and independence using RUE as dominant.    Time  8    Period  Weeks    Status  On-going            Plan - 05/08/19 1151    Clinical Impression Statement  A: Focused on A/ROM supine and standing. Patient voiced a high level of pain so repetitions were not increased. Did add overhead lacing to further focus on shoulder stability and endurance. Manual techniques were completed to address fascial restrictions. VC for form and technique were provided.    Body Structure / Function / Physical  Skills  ADL;UE functional use;Fascial restriction;Pain;ROM;IADL;Strength    Plan  P: reassessment. Determine if additional therapy is needed. FOTO.    Consulted and Agree with Plan of Care  Patient       Patient will benefit from skilled therapeutic intervention in order to improve the following deficits and impairments:   Body Structure / Function / Physical Skills: ADL, UE functional use, Fascial restriction, Pain, ROM, IADL, Strength       Visit Diagnosis: Acute pain of right shoulder  Other symptoms and signs involving the musculoskeletal system  Stiffness of right shoulder, not elsewhere classified    Problem List Patient Active Problem List   Diagnosis Date Noted  . S/P right rotator cuff repair 01/30/19 02/06/2019  . Nontraumatic complete tear of right rotator cuff   . Arthritis of right acromioclavicular joint    . S/P total knee replacement, 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/24/2018  . Shoulder impingement, right 05/24/2018  . Dehydration   . Intractable cyclical vomiting [id number]  . Pyloric stenosis in adult   . Thrombocytopenia ([place]) 12/20/2017  . Leukocytosis   . Malnutrition of moderate degree 12/17/2017  . Acute renal failure (ARF) ([place]) 12/15/2017  . Chronic abdominal pain 12/15/2017  . Gastroenteritis 06/14/2016  . Nausea with vomiting 06/10/2016  . Uterine enlargement 06/09/2016  . Intractable nausea and vomiting 06/08/2016  . Acute infective gastroenteritis 06/08/2016  . Diarrhea 06/08/2016  . Essential hypertension 06/08/2016  . GERD (gastroesophageal reflux disease) 06/08/2016  . Abdominal pain   . Endometrial polyp 04/15/2016  . PMB (postmenopausal bleeding) 02/27/2016  . Alcoholic cirrhosis of liver without ascites ([place])   . Asthma 01/21/2016  . Hepatic cirrhosis ([place]) 09/22/2015  . Arthritis of knee, degenerative 02/05/2015  . History of Helicobacter pylori infection 10/30/2014  . Chronic [name] with cirrhosis ([place]) 04/11/2014  . De Quervain's disease (radial styloid tenosynovitis) 12/25/2013  . Anorexia 11/21/2012  . FH: colon cancer 11/21/2012  . Early satiety 10/25/2012  . Bowel habit changes 10/25/2012  . Abdominal pain, epigastric 10/25/2012  . Abdominal bloating 10/25/2012  . Constipation 10/25/2012  . Abnormal weight loss 10/25/2012  . Chronic viral [name] ([place]) 10/25/2012  . Radicular pain of left lower extremity 09/28/2012  . Back pain 09/28/2012  . Sciatica 08/10/2011  . S/P arthroscopy of left knee 08/10/2011  . Tibial plateau fracture 08/10/2011  . Pain in joint, lower leg 02/12/2011  . Stiffness of joint, not elsewhere classified, lower leg 02/12/2011  . 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 Meniscus, medial, derangement 12/29/2010  . CLOSED FRACTURE OF UPPER END OF TIBIA 08/12/2010   Ailene Ravel, OTR/L,CBIS  [phone]  05/08/2019, 12:17  PM  [facility] [address] [place], [place], [address] Phone: [phone]   Fax:  [phone]  Name: Colleen Lee MRN: [id number] Date of Birth: Apr 12, 1956

## 2019-05-10 ENCOUNTER — Other Ambulatory Visit: Payer: Self-pay

## 2019-05-10 ENCOUNTER — Ambulatory Visit (HOSPITAL_COMMUNITY): Payer: Medicare Other | Admitting: Specialist

## 2019-05-10 ENCOUNTER — Encounter (HOSPITAL_COMMUNITY): Payer: Self-pay | Admitting: Specialist

## 2019-05-10 DIAGNOSIS — M25611 Stiffness of right shoulder, not elsewhere classified: Secondary | ICD-10-CM

## 2019-05-10 DIAGNOSIS — M25511 Pain in right shoulder: Secondary | ICD-10-CM | POA: Diagnosis not present

## 2019-05-10 DIAGNOSIS — R29898 Other symptoms and signs involving the musculoskeletal system: Secondary | ICD-10-CM | POA: Diagnosis not present

## 2019-05-10 NOTE — Therapy (Signed)
[facility] [address] [place], [place], [address] Phone: [phone]   Fax:  [phone]  Occupational Therapy Treatment Progress Note Reporting Period 04/12/19 to 05/10/19  See note below for Objective Data and Assessment of Progress/Goals.       Patient Details  Name: Colleen Lee MRN: [id number] Date of Birth: [birth year] Referring Provider (OT): Dr. Arther Abbott   Encounter Date: 05/10/2019  OT End of Session - 05/10/19 1603    Visit Number  16    Number of Visits  24    Date for OT Re-Evaluation  06/07/19    Authorization Type  1) [employer] Medicare 2) Medicaid    Authorization Time Period  visits based on medical necessity. progress note on visit 26.    Authorization - Visit Number  16    Authorization - Number of Visits  26    OT Start Time  1301    OT Stop Time  1345    OT Time Calculation (min)  44 min    Activity Tolerance  Patient tolerated treatment well    Behavior During Therapy  WFL for tasks assessed/performed       Past Medical History:  Diagnosis Date  . Asthma   . Chronic abdominal pain   . Chronic back pain   . Chronic constipation   . Cirrhosis ([place])    Metavir score F4 on elastography 2015  . Cyclical vomiting   . Fibroids   . Fibromyalgia   . GERD (gastroesophageal reflux disease)   . H. pylori infection 2014   treated with pylera, had to be treated again as it was not eradicated. Urea breath test then negative after subsequent treatment.   . [name]    HCV RNA positive 09/2012  . Hypertension   . Marijuana use   . Nausea and vomiting    chronic, recurrent  . PONV (postoperative nausea and vomiting)    pt thinks maybe once she had N&V  . Sciatica of left side     Past Surgical History:  Procedure Laterality Date  . BALLOON DILATION  12/20/2017   Procedure: BALLOON DILATION;  Surgeon: Danie Binder, MD;  [facility];  Service: Endoscopy;;  pyloric dilation  . BIOPSY   12/20/2017   Procedure: BIOPSY;  Surgeon: Danie Binder, MD;  [facility];  Service: Endoscopy;;  duodenal and gastric biopsy  . COLONOSCOPY  01/18/2008   JSH:FWYOVZ rectum.  Long redundant colon, a diminutive sigmoid polyp status post cold biopsy removed. Hyperplastic polyp. Repeat colonoscopy June 2014 due to family history of colon cancer  . COLONOSCOPY WITH ESOPHAGOGASTRODUODENOSCOPY (EGD) N/A 11/02/2012   CHY:IFOYDXAJ gastric mucosa of doubtful, +H.pylori. Incomplete colonoscopy due to patient unable to tolerate exam, proximal colon seen. Patient refused ACBE.  [facility] COLONOSCOPY WITH PROPOFOL N/A 03/08/2013   Dr. Gala Romney: colonic polyp-removed as scribed above. Internal Hemorrhoids. Pathology did not reveal any colonic tissue, only mucus. SURVEILLANCE DUE Aug 2019  . COLONOSCOPY WITH PROPOFOL N/A 01/19/2018   Dr. Gala Romney: 6 mm cecal inflammatoy polyp, otherwise normal., Surveilance in 5 year s  . ESOPHAGEAL DILATION  12/20/2017   Procedure: ESOPHAGEAL DILATION;  Surgeon: Danie Binder, MD;  [facility];  Service: Endoscopy;;  . ESOPHAGOGASTRODUODENOSCOPY  01/18/2008   RMR: Normal esophagus, normal  stomach  . ESOPHAGOGASTRODUODENOSCOPY (EGD) WITH PROPOFOL N/A 02/09/2016   Normal esophagus, small hiatal hernia, portal hypertensive gastropathy, normal second portion of duodenum. Due in July  2019  . ESOPHAGOGASTRODUODENOSCOPY (EGD) WITH PROPOFOL N/A 12/20/2017   Dr. Oneida Alar: benign appearing esophageal stenosis s/p dilation, mild pyloric stenosis s/p biopsy and dilation, mild duodenitis.   [facility] HYSTEROSCOPY  05/11/2016   Procedure: HYSTEROSCOPY;  Surgeon: Jonnie Kind, MD;  Location: [facility];  Service: Gynecology;;  . KNEE SURGERY     left knee  . MULTIPLE EXTRACTIONS WITH ALVEOLOPLASTY N/A 10/15/2013   Procedure: MULTIPLE EXTRACTION WITH ALVEOLOPLASTY;  Surgeon: Gae Bon, DDS;  Location: [facility];  Service: Oral Surgery;  Laterality: N/A;  . POLYPECTOMY N/A 03/08/2013    Procedure: POLYPECTOMY;  Surgeon: Daneil Dolin, MD;  Location: [facility];  Service: Endoscopy;  Laterality: N/A;  . POLYPECTOMY N/A 05/11/2016   Procedure: REMOVAL OF ENDOMETRIAL POLYP;  Surgeon: Jonnie Kind, MD;  Location: [facility];  Service: Gynecology;  Laterality: N/A;  . RESECTION DISTAL CLAVICAL Right 01/30/2019   Procedure: RESECTION DISTAL CLAVICAL;  Surgeon: Carole Civil, MD;  Location: [facility];  Service: Orthopedics;  Laterality: Right;  . SHOULDER OPEN ROTATOR CUFF REPAIR Right 01/30/2019   Procedure: ROTATOR CUFF REPAIR SHOULDER OPEN;  Surgeon: Carole Civil, MD;  Location: [facility];  Service: Orthopedics;  Laterality: Right;  pt to arrive at 7:30 for PICC at 8:00  . TOTAL KNEE ARTHROPLASTY Left 02/05/2015   Procedure: LEFT TOTAL KNEE ARTHROPLASTY;  Surgeon: Carole Civil, MD;  Location: [facility];  Service: Orthopedics;  Laterality: Left;    There were no vitals filed for this visit.  Subjective Assessment - 05/10/19 1601    Subjective   S:  I think I might need some more therapy    Currently in Pain?  Yes    Pain Score  5     Pain Location  Shoulder    Pain Orientation  Right    Pain Descriptors / Indicators  Aching;Sore    Pain Type  Acute pain         [facility] OT Assessment - 05/10/19 0001      Assessment   Medical Diagnosis  s/p Right Open RCR      Precautions   Precautions  Shoulder    Type of Shoulder Precautions  progress as tolerated       ADL   ADL comments  improved independence with bilateral tasks, and using right arm to reach to shoulder height.  Patient has difficulty reaching up to comb her hair into a ponytail, mopping, and other cooking tasks.       Observation/Other Assessments   Focus on Therapeutic Outcomes (FOTO)   60/100      Palpation   Palpation comment  min fascial restrictions       AROM   Overall AROM Comments  Assessed seated, er/IR adducted    Right Shoulder Flexion  150 Degrees   111   Right Shoulder ABduction  150  Degrees   90   Right Shoulder Internal Rotation  90 Degrees   90   Right Shoulder External Rotation  65 Degrees   46     PROM   Overall PROM Comments  assessed in supine and is WNL      Strength   Overall Strength Comments  Assessed seated, er/IR adducted    Right Shoulder Flexion  4/5   3/5   Right Shoulder ABduction  4/5   3/5   Right Shoulder Internal Rotation  4/5   3/5   Right Shoulder External Rotation  4/5   3/5  OT Treatments/Exercises (OP) - 05/10/19 0001      Exercises   Exercises  Shoulder      Shoulder Exercises: Supine   Protraction  PROM;5 reps    Horizontal ABduction  PROM;5 reps    External Rotation  PROM;5 reps    Internal Rotation  PROM;5 reps    Flexion  PROM;5 reps    ABduction  PROM;5 reps      Shoulder Exercises: Seated   Protraction  AROM;12 reps    Horizontal ABduction  AROM;12 reps    External Rotation  AROM;12 reps    Internal Rotation  AROM;12 reps    Flexion  AROM;12 reps    Abduction  AROM;12 reps    Other Seated Exercises  holding green therapy ball, completed 10 repetitions of chest press, overhead press, overhead chop, and pulsing X4 with rest breaks between each exercise      Shoulder Exercises: ROM/Strengthening   Ball on Wall  1' with arm flexed to 90 and 1' with arm abducted to 90      Manual Therapy   Manual Therapy  Myofascial release    Manual therapy comments  completed separately from therapeutic exercises    Myofascial Release  myofascial release and manual therapy complete to right upper arm, trapezius, and scapular regions to decrease fascial restrictions and improve pain and ROM             OT Education - 05/10/19 1602    Education Details  reviewed progress towards goals thus far and focus for addtional 4 weeks of treatment.    Person(s) Educated  Patient    Methods  Explanation    Comprehension  Verbalized understanding       OT Short Term Goals - 05/10/19 1315      OT SHORT TERM  GOAL #1   Title  Patient will be educated and independent with HEP to faciliate progress in therapy and allow her to return to using her RUE as her dominant extremity.     Time  4    Period  Weeks    Status  On-going    Target Date  04/15/19      OT SHORT TERM GOAL #2   Title  Patient will increase RUE P/ROM to WNL in order to increase ability to complete dressing tasks with less difficulty.     Time  4    Period  Weeks    Status  Achieved      OT SHORT TERM GOAL #3   Title  Patient will report a decrease in pain level of 5/10 to improve ability to sleep in the bed vs the couch.    Time  4    Period  Weeks    Status  Achieved        OT Long Term Goals - 05/10/19 1316      OT LONG TERM GOAL #1   Title  Patient will increase her RUE A/ROM to WNL to improve ability to reach into overhead cabinets.    Time  8    Period  Weeks    Status  Achieved      OT LONG TERM GOAL #2   Title  Patient will increase her RUE strength to 4+/5 to improve ability to perform gardening tasks using RUE as dominant.    Time  8    Period  Weeks    Status  On-going      OT LONG TERM GOAL #3  Title  Patient will decrease her fascial restrictions to trace amount or less in order to increase mobility required to complete functional reaching tasks.    Baseline  goal was for min restrictions, which she has met on 05/10/19 - updated goal for trace restrictions for improved pain control.    Time  8    Period  Weeks    Status  Revised      OT LONG TERM GOAL #4   Title  Patient will report a decrease in pain of approximately 3/10 or less in her RUE while completing household tasks with her right arm.    Time  8    Period  Weeks    Status  Achieved      OT LONG TERM GOAL #5   Title  Pt will return to highest level of functioning and independence using RUE as dominant.    Time  8    Period  Weeks    Status  On-going            Plan - 05/10/19 1603    Clinical Impression Statement  A:  Patient  has met all short term goals and 3/5 long term goals.  Updated goals to decrease restrictions to trace for improved pain control during functional activities. Patient has significantly improved her A/ROM and decreased pain.  She continues to have difficulty combing her hair into a ponytail and completing overhead activities with right arm by itself.  She feels she is about 40% limited due to lack of shoulder strength.    Occupational performance deficits (Please refer to evaluation for details):  ADL's;IADL's;Rest and Sleep;Leisure    Body Structure / Function / Physical Skills  ADL;UE functional use;Fascial restriction;Pain;ROM;IADL;Strength    Plan  P:  Continue skilled OT intervention 2 times a week for 4 weeks to improve right shoulder strength and overhead functional use.       Patient will benefit from skilled therapeutic intervention in order to improve the following deficits and impairments:   Body Structure / Function / Physical Skills: ADL, UE functional use, Fascial restriction, Pain, ROM, IADL, Strength       Visit Diagnosis: Acute pain of right shoulder  Other symptoms and signs involving the musculoskeletal system  Stiffness of right shoulder, not elsewhere classified    Problem List Patient Active Problem List   Diagnosis Date Noted  . S/P right rotator cuff repair 01/30/19 02/06/2019  . Nontraumatic complete tear of right rotator cuff   . Arthritis of right acromioclavicular joint   . S/P total knee replacement, 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/24/2018  . Shoulder impingement, right 05/24/2018  . Dehydration   . Intractable cyclical vomiting 97/09/6376  . Pyloric stenosis in adult   . Thrombocytopenia ([place]) 12/20/2017  . Leukocytosis   . Malnutrition of moderate degree 12/17/2017  . Acute renal failure (ARF) ([place]) 12/15/2017  . Chronic abdominal pain 12/15/2017  . Gastroenteritis 06/14/2016  . Nausea with vomiting 06/10/2016  . Uterine enlargement 06/09/2016  . Intractable  nausea and vomiting 06/08/2016  . Acute infective gastroenteritis 06/08/2016  . Diarrhea 06/08/2016  . Essential hypertension 06/08/2016  . GERD (gastroesophageal reflux disease) 06/08/2016  . Abdominal pain   . Endometrial polyp 04/15/2016  . PMB (postmenopausal bleeding) 02/27/2016  . Alcoholic cirrhosis of liver without ascites ([place])   . Asthma 01/21/2016  . Hepatic cirrhosis ([place]) 09/22/2015  . Arthritis of knee, degenerative 02/05/2015  . History of Helicobacter pylori infection 10/30/2014  . Chronic [name] with cirrhosis (  [place]) 04/11/2014  . De Quervain's disease (radial styloid tenosynovitis) 12/25/2013  . Anorexia 11/21/2012  . FH: colon cancer 11/21/2012  . Early satiety 10/25/2012  . Bowel habit changes 10/25/2012  . Abdominal pain, epigastric 10/25/2012  . Abdominal bloating 10/25/2012  . Constipation 10/25/2012  . Abnormal weight loss 10/25/2012  . Chronic viral [name] ([place]) 10/25/2012  . Radicular pain of left lower extremity 09/28/2012  . Back pain 09/28/2012  . Sciatica 08/10/2011  . S/P arthroscopy of left knee 08/10/2011  . Tibial plateau fracture 08/10/2011  . Pain in joint, lower leg 02/12/2011  . Stiffness of joint, not elsewhere classified, lower leg 02/12/2011  . 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 Meniscus, medial, derangement 12/29/2010  . CLOSED FRACTURE OF UPPER END OF TIBIA 08/12/2010    Vangie Bicker, [place], OTR/L [phone]  05/10/2019, 4:06 PM  [facility] [place], [place], [address] Phone: [phone]   Fax:  [phone]  Name: ALEXIYA FRANQUI MRN: [id number] Date of Birth: 1956/06/26

## 2019-05-14 ENCOUNTER — Ambulatory Visit (HOSPITAL_COMMUNITY): Payer: Medicare Other

## 2019-05-14 ENCOUNTER — Other Ambulatory Visit: Payer: Self-pay

## 2019-05-14 ENCOUNTER — Encounter (HOSPITAL_COMMUNITY): Payer: Self-pay

## 2019-05-14 DIAGNOSIS — M25611 Stiffness of right shoulder, not elsewhere classified: Secondary | ICD-10-CM | POA: Diagnosis not present

## 2019-05-14 DIAGNOSIS — R29898 Other symptoms and signs involving the musculoskeletal system: Secondary | ICD-10-CM

## 2019-05-14 DIAGNOSIS — M25511 Pain in right shoulder: Secondary | ICD-10-CM

## 2019-05-14 NOTE — Therapy (Signed)
[place], [place], [address] Phone: [phone]   Fax:  [phone]  Occupational Therapy Treatment  Patient Details  Name: Colleen Lee MRN: [id number] Date of Birth: January 11, 1956 Referring Provider (OT): Dr. Arther Abbott   Encounter Date: 05/14/2019  OT End of Session - 05/14/19 1719    Visit Number  17    Number of Visits  24    Date for OT Re-Evaluation  06/07/19    Authorization Type  1) [employer] Medicare 2) Medicaid    Authorization Time Period  visits based on medical necessity. progress note on visit 26.    Authorization - Visit Number  17    Authorization - Number of Visits  26    OT Start Time  3159    OT Stop Time  1723    OT Time Calculation (min)  38 min    Activity Tolerance  Patient tolerated treatment well    Behavior During Therapy  WFL for tasks assessed/performed       Past Medical History:  Diagnosis Date  . Asthma   . Chronic abdominal pain   . Chronic back pain   . Chronic constipation   . Cirrhosis ([place])    Metavir score F4 on elastography 2015  . Cyclical vomiting   . Fibroids   . Fibromyalgia   . GERD (gastroesophageal reflux disease)   . H. pylori infection 2014   treated with pylera, had to be treated again as it was not eradicated. Urea breath test then negative after subsequent treatment.   . [name]    HCV RNA positive 09/2012  . Hypertension   . Marijuana use   . Nausea and vomiting    chronic, recurrent  . PONV (postoperative nausea and vomiting)    pt thinks maybe once she had N&V  . Sciatica of left side     Past Surgical History:  Procedure Laterality Date  . BALLOON DILATION  12/20/2017   Procedure: BALLOON DILATION;  Surgeon: Danie Binder, MD;  [facility];  Service: Endoscopy;;  pyloric dilation  . BIOPSY  12/20/2017   Procedure: BIOPSY;  Surgeon: Danie Binder, MD;  [facility];  Service: Endoscopy;;  duodenal and gastric  biopsy  . COLONOSCOPY  01/18/2008   YVO:[id number] rectum.  Long redundant colon, a diminutive sigmoid polyp status post cold biopsy removed. Hyperplastic polyp. Repeat colonoscopy June 2014 due to family history of colon cancer  . COLONOSCOPY WITH ESOPHAGOGASTRODUODENOSCOPY (EGD) N/A 11/02/2012   MQK:[id number] gastric mucosa of doubtful, +H.pylori. Incomplete colonoscopy due to patient unable to tolerate exam, proximal colon seen. Patient refused ACBE.  Marland Kitchen COLONOSCOPY WITH PROPOFOL N/A 03/08/2013   Dr. Gala Romney: colonic polyp-removed as scribed above. Internal Hemorrhoids. Pathology did not reveal any colonic tissue, only mucus. SURVEILLANCE DUE Aug 2019  . COLONOSCOPY WITH PROPOFOL N/A 01/19/2018   Dr. Gala Romney: 6 mm cecal inflammatoy polyp, otherwise normal., Surveilance in 5 year s  . ESOPHAGEAL DILATION  12/20/2017   Procedure: ESOPHAGEAL DILATION;  Surgeon: Danie Binder, MD;  [facility];  Service: Endoscopy;;  . ESOPHAGOGASTRODUODENOSCOPY  01/18/2008   RMR: Normal esophagus, normal  stomach  . ESOPHAGOGASTRODUODENOSCOPY (EGD) WITH PROPOFOL N/A 02/09/2016   Normal esophagus, small hiatal hernia, portal hypertensive gastropathy, normal second portion of duodenum. Due in July 2019  . ESOPHAGOGASTRODUODENOSCOPY (EGD) WITH PROPOFOL N/A 12/20/2017   Dr. Oneida Alar: benign appearing esophageal stenosis s/p dilation, mild pyloric stenosis s/p  biopsy and dilation, mild duodenitis.   Marland Kitchen HYSTEROSCOPY  05/11/2016   Procedure: HYSTEROSCOPY;  Surgeon: Jonnie Kind, MD;  Location: [facility];  Service: Gynecology;;  . KNEE SURGERY     left knee  . MULTIPLE EXTRACTIONS WITH ALVEOLOPLASTY N/A 10/15/2013   Procedure: MULTIPLE EXTRACTION WITH ALVEOLOPLASTY;  Surgeon: Gae Bon, DDS;  Location: [facility];  Service: Oral Surgery;  Laterality: N/A;  . POLYPECTOMY N/A 03/08/2013   Procedure: POLYPECTOMY;  Surgeon: Daneil Dolin, MD;  Location[facility];  Service: Endoscopy;  Laterality: N/A;  . POLYPECTOMY N/A 05/11/2016    Procedure: REMOVAL OF ENDOMETRIAL POLYP;  Surgeon: Jonnie Kind, MD;  Location: [facility];  Service: Gynecology;  Laterality: N/A;  . RESECTION DISTAL CLAVICAL Right 01/30/2019   Procedure: RESECTION DISTAL CLAVICAL;  Surgeon: Carole Civil, MD;  Location: [facility];  Service: Orthopedics;  Laterality: Right;  . SHOULDER OPEN ROTATOR CUFF REPAIR Right 01/30/2019   Procedure: ROTATOR CUFF REPAIR SHOULDER OPEN;  Surgeon: Carole Civil, MD;  Location: [facility];  Service: Orthopedics;  Laterality: Right;  pt to arrive at 7:30 for PICC at 8:00  . TOTAL KNEE ARTHROPLASTY Left 02/05/2015   Procedure: LEFT TOTAL KNEE ARTHROPLASTY;  Surgeon: Carole Civil, MD;  Location[facility];  Service: Orthopedics;  Laterality: Left;    There were no vitals filed for this visit.  Subjective Assessment - 05/14/19 1648    Subjective   S: I fell yesterday. I was wearing flip flops and I think they may have slipped on the water. I went backwards and his my shoulder and head. Now my shoulder is sore.    Currently in Pain?  Yes    Pain Score  7     Pain Location  Shoulder    Pain Orientation  Right    Pain Descriptors / Indicators  Aching;Sore    Pain Type  Acute pain    Pain Radiating Towards  N/A    Pain Onset  Yesterday    Pain Frequency  Constant    Aggravating Factors   Pt fell backwards walking to her home.    Pain Relieving Factors  None    Effect of Pain on Daily Activities  mod effect    Multiple Pain Sites  No         [facility] OT Assessment - 05/14/19 1658      Assessment   Medical Diagnosis  s/p Right Open RCR      Precautions   Precautions  Shoulder    Type of Shoulder Precautions  progress as tolerated                OT Treatments/Exercises (OP) - 05/14/19 1658      Exercises   Exercises  Shoulder      Shoulder Exercises: Seated   Protraction  Strengthening;10 reps    Protraction Weight (lbs)  1    Horizontal ABduction  Strengthening;10 reps    Horizontal ABduction  Weight (lbs)  1    External Rotation  Strengthening;10 reps    External Rotation Weight (lbs)  1    Internal Rotation  Strengthening;10 reps    Internal Rotation Weight (lbs)  1    Flexion  Strengthening;10 reps    Flexion Weight (lbs)  1    Abduction  Strengthening;10 reps    ABduction Weight (lbs)  1      Shoulder Exercises: ROM/Strengthening   UBE (Upper Arm Bike)  Level 2 2' forward 2'  reverse   PACE: 5.0-6.0   Over Head Lace  2' seated    X to V Arms  10X    Proximal Shoulder Strengthening, Seated  12X A/ROM with verbal and visual cuing               OT Short Term Goals - 05/14/19 1720      OT SHORT TERM GOAL #1   Title  Patient will be educated and independent with HEP to faciliate progress in therapy and allow her to return to using her RUE as her dominant extremity.     Time  4    Period  Weeks    Status  On-going    Target Date  04/15/19      OT SHORT TERM GOAL #2   Title  Patient will increase RUE P/ROM to WNL in order to increase ability to complete dressing tasks with less difficulty.     Time  4    Period  Weeks      OT SHORT TERM GOAL #3   Title  Patient will report a decrease in pain level of 5/10 to improve ability to sleep in the bed vs the couch.    Time  4    Period  Weeks        OT Long Term Goals - 05/14/19 1720      OT LONG TERM GOAL #1   Title  Patient will increase her RUE A/ROM to WNL to improve ability to reach into overhead cabinets.    Time  8    Period  Weeks      OT LONG TERM GOAL #2   Title  Patient will increase her RUE strength to 4+/5 to improve ability to perform gardening tasks using RUE as dominant.    Time  8    Period  Weeks    Status  On-going      OT LONG TERM GOAL #3   Title  Patient will decrease her fascial restrictions to trace amount or less in order to increase mobility required to complete functional reaching tasks.    Baseline  goal was for min restrictions, which she has met on 05/10/19 - updated goal  for trace restrictions for improved pain control.    Time  8    Period  Weeks    Status  On-going      OT LONG TERM GOAL #4   Title  Patient will report a decrease in pain of approximately 3/10 or less in her RUE while completing household tasks with her right arm.    Time  8    Period  Weeks      OT LONG TERM GOAL #5   Title  Pt will return to highest level of functioning and independence using RUE as dominant.    Time  8    Period  Weeks    Status  On-going            Plan - 05/14/19 1727    Clinical Impression Statement  A: Patient with no new fascial restrictions post fall although reports tenderness and soreness on inferior portion of shoulder lateral to shoulder blade. Pt was able to complete all exercises with full ROM. Rest breaks taken as needed due to discomfort in shoulder. Focused on shoulder and scapular strengthening during session while progressing to 1# hand weight.    Body Structure / Function / Physical Skills  ADL;UE functional use;Fascial restriction;Pain;ROM;IADL;Strength    Plan  P:  Continue to work shoulder strengthening and scapular stability. Add red band loop exercises.    Consulted and Agree with Plan of Care  Patient       Patient will benefit from skilled therapeutic intervention in order to improve the following deficits and impairments:   Body Structure / Function / Physical Skills: ADL, UE functional use, Fascial restriction, Pain, ROM, IADL, Strength       Visit Diagnosis: Other symptoms and signs involving the musculoskeletal system  Stiffness of right shoulder, not elsewhere classified  Acute pain of right shoulder    Problem List Patient Active Problem List   Diagnosis Date Noted  . S/P right rotator cuff repair 01/30/19 02/06/2019  . Nontraumatic complete tear of right rotator cuff   . Arthritis of right acromioclavicular joint   . S/P total knee replacement, 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/24/2018  . Shoulder impingement, right 05/24/2018  .  Dehydration   . Intractable cyclical vomiting 82/42/3536  . Pyloric stenosis in adult   . Thrombocytopenia ([place]) 12/20/2017  . Leukocytosis   . Malnutrition of moderate degree 12/17/2017  . Acute renal failure (ARF) ([place]) 12/15/2017  . Chronic abdominal pain 12/15/2017  . Gastroenteritis 06/14/2016  . Nausea with vomiting 06/10/2016  . Uterine enlargement 06/09/2016  . Intractable nausea and vomiting 06/08/2016  . Acute infective gastroenteritis 06/08/2016  . Diarrhea 06/08/2016  . Essential hypertension 06/08/2016  . GERD (gastroesophageal reflux disease) 06/08/2016  . Abdominal pain   . Endometrial polyp 04/15/2016  . PMB (postmenopausal bleeding) 02/27/2016  . Alcoholic cirrhosis of liver without ascites ([place])   . Asthma 01/21/2016  . Hepatic cirrhosis ([place]) 09/22/2015  . Arthritis of knee, degenerative 02/05/2015  . History of Helicobacter pylori infection 10/30/2014  . Chronic [name] with cirrhosis ([place]) 04/11/2014  . De Quervain's disease (radial styloid tenosynovitis) 12/25/2013  . Anorexia 11/21/2012  . FH: colon cancer 11/21/2012  . Early satiety 10/25/2012  . Bowel habit changes 10/25/2012  . Abdominal pain, epigastric 10/25/2012  . Abdominal bloating 10/25/2012  . Constipation 10/25/2012  . Abnormal weight loss 10/25/2012  . Chronic viral [name] ([place]) 10/25/2012  . Radicular pain of left lower extremity 09/28/2012  . Back pain 09/28/2012  . Sciatica 08/10/2011  . S/P arthroscopy of left knee 08/10/2011  . Tibial plateau fracture 08/10/2011  . Pain in joint, lower leg 02/12/2011  . Stiffness of joint, not elsewhere classified, lower leg 02/12/2011  . 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 Meniscus, medial, derangement 12/29/2010  . CLOSED FRACTURE OF UPPER END OF TIBIA 08/12/2010   Ailene Ravel, OTR/L,CBIS  [phone]  05/14/2019, 5:29 PM  [facility] [address] [place], [place],  [address] Phone: [phone]   Fax:  [phone]  Name: Colleen Lee MRN: [id number] Date of Birth: 1956/04/02

## 2019-05-15 ENCOUNTER — Encounter (HOSPITAL_COMMUNITY): Payer: Medicare Other

## 2019-05-15 ENCOUNTER — Other Ambulatory Visit: Payer: Self-pay | Admitting: Orthopedic Surgery

## 2019-05-15 DIAGNOSIS — G8918 Other acute postprocedural pain: Secondary | ICD-10-CM

## 2019-05-15 MED ORDER — HYDROCODONE-ACETAMINOPHEN 5-325 MG PO TABS: 1.0000 | ORAL_TABLET | Freq: Four times a day (QID) | ORAL | 0 refills | Status: DC | PRN

## 2019-05-15 NOTE — Telephone Encounter (Signed)
Patient called for refill / aware that she has an appointment tomorrow, 05/16/19; states will attend as scheduled. Medication requested: HYDROcodone-acetaminophen (NORCO/VICODIN) 5-325 MG tablet 28 tablet  -[employer]

## 2019-05-16 ENCOUNTER — Other Ambulatory Visit: Payer: Self-pay

## 2019-05-16 ENCOUNTER — Ambulatory Visit (INDEPENDENT_AMBULATORY_CARE_PROVIDER_SITE_OTHER): Payer: Medicare Other | Admitting: Orthopedic Surgery

## 2019-05-16 VITALS — BP 119/72 | HR 80 | Temp 98.2°F | Ht 66.0 in | Wt 190.0 lb

## 2019-05-16 DIAGNOSIS — Z9889 Other specified postprocedural states: Secondary | ICD-10-CM

## 2019-05-16 DIAGNOSIS — M25511 Pain in right shoulder: Secondary | ICD-10-CM

## 2019-05-16 NOTE — Progress Notes (Signed)
Chief Complaint  Patient presents with  . Follow-up    5 week recheck on right shoulder, DOS 01-30-19.   Therapy: [facility]   Flexion 150 active   Continue PT as outpatient  F/u in 5 weeks   Encounter Diagnoses  Name Primary?  . S/P right rotator cuff repair 01/30/19 Yes  . Right shoulder pain, unspecified chronicity

## 2019-05-17 ENCOUNTER — Encounter (HOSPITAL_COMMUNITY): Payer: Medicare Other | Admitting: Occupational Therapy

## 2019-05-18 ENCOUNTER — Encounter (HOSPITAL_COMMUNITY): Payer: Self-pay | Admitting: Specialist

## 2019-05-18 ENCOUNTER — Ambulatory Visit (HOSPITAL_COMMUNITY): Payer: Medicare Other | Admitting: Specialist

## 2019-05-18 ENCOUNTER — Other Ambulatory Visit: Payer: Self-pay

## 2019-05-18 DIAGNOSIS — M25611 Stiffness of right shoulder, not elsewhere classified: Secondary | ICD-10-CM

## 2019-05-18 DIAGNOSIS — M25511 Pain in right shoulder: Secondary | ICD-10-CM | POA: Diagnosis not present

## 2019-05-18 DIAGNOSIS — R29898 Other symptoms and signs involving the musculoskeletal system: Secondary | ICD-10-CM | POA: Diagnosis not present

## 2019-05-18 NOTE — Therapy (Signed)
[place], [place], [address] Phone: [phone]   Fax:  [phone]  Occupational Therapy Treatment  Patient Details  Name: Colleen Lee MRN: [id number] Date of Birth: 1956-03-20 Referring Provider (OT): Dr. Arther Abbott   Encounter Date: 05/18/2019  OT End of Session - 05/18/19 1010    Visit Number  18    Number of Visits  24    Date for OT Re-Evaluation  06/07/19    Authorization Type  1) [employer] Medicare 2) Medicaid    Authorization Time Period  visits based on medical necessity. progress note on visit 26.    Authorization - Visit Number  18    Authorization - Number of Visits  26    OT Start Time  0950    OT Stop Time  1030    OT Time Calculation (min)  40 min    Activity Tolerance  Patient tolerated treatment well    Behavior During Therapy  WFL for tasks assessed/performed       Past Medical History:  Diagnosis Date  . Asthma   . Chronic abdominal pain   . Chronic back pain   . Chronic constipation   . Cirrhosis ([place])    Metavir score F4 on elastography 2015  . Cyclical vomiting   . Fibroids   . Fibromyalgia   . GERD (gastroesophageal reflux disease)   . H. pylori infection 2014   treated with pylera, had to be treated again as it was not eradicated. Urea breath test then negative after subsequent treatment.   . [name]    HCV RNA positive 09/2012  . Hypertension   . Marijuana use   . Nausea and vomiting    chronic, recurrent  . PONV (postoperative nausea and vomiting)    pt thinks maybe once she had N&V  . Sciatica of left side     Past Surgical History:  Procedure Laterality Date  . BALLOON DILATION  12/20/2017   Procedure: BALLOON DILATION;  Surgeon: Danie Binder, MD;  [facility];  Service: Endoscopy;;  pyloric dilation  . BIOPSY  12/20/2017   Procedure: BIOPSY;  Surgeon: Danie Binder, MD;  [facility]: [facility];  Service: Endoscopy;;  duodenal and gastric  biopsy  . COLONOSCOPY  01/18/2008   BEE:FEOFHQ rectum.  Long redundant colon, a diminutive sigmoid polyp status post cold biopsy removed. Hyperplastic polyp. Repeat colonoscopy June 2014 due to family history of colon cancer  . COLONOSCOPY WITH ESOPHAGOGASTRODUODENOSCOPY (EGD) N/A 11/02/2012   RFX:JOITGPQD gastric mucosa of doubtful, +H.pylori. Incomplete colonoscopy due to patient unable to tolerate exam, proximal colon seen. Patient refused ACBE.  [facility] COLONOSCOPY WITH PROPOFOL N/A 03/08/2013   Dr. Gala Romney: colonic polyp-removed as scribed above. Internal Hemorrhoids. Pathology did not reveal any colonic tissue, only mucus. SURVEILLANCE DUE Aug 2019  . COLONOSCOPY WITH PROPOFOL N/A 01/19/2018   Dr. Gala Romney: 6 mm cecal inflammatoy polyp, otherwise normal., Surveilance in 5 year s  . ESOPHAGEAL DILATION  12/20/2017   Procedure: ESOPHAGEAL DILATION;  Surgeon: Danie Binder, MD;  [facility];  Service: Endoscopy;;  . ESOPHAGOGASTRODUODENOSCOPY  01/18/2008   RMR: Normal esophagus, normal  stomach  . ESOPHAGOGASTRODUODENOSCOPY (EGD) WITH PROPOFOL N/A 02/09/2016   Normal esophagus, small hiatal hernia, portal hypertensive gastropathy, normal second portion of duodenum. Due in July 2019  . ESOPHAGOGASTRODUODENOSCOPY (EGD) WITH PROPOFOL N/A 12/20/2017   Dr. Oneida Alar: benign appearing esophageal stenosis s/p dilation, mild pyloric stenosis s/p  biopsy and dilation, mild duodenitis.   . HYSTEROSCOPY  05/11/2016   Procedure: HYSTEROSCOPY;  Surgeon: John V Ferguson, MD;  Location: [facility];  Service: Gynecology;;  . KNEE SURGERY     left knee  . MULTIPLE EXTRACTIONS WITH ALVEOLOPLASTY N/A 10/15/2013   Procedure: MULTIPLE EXTRACTION WITH ALVEOLOPLASTY;  Surgeon: Scott M Jensen, DDS;  Location: [facility];  Service: Oral Surgery;  Laterality: N/A;  . POLYPECTOMY N/A 03/08/2013   Procedure: POLYPECTOMY;  Surgeon: Robert M Rourk, MD;  Location: [facility];  Service: Endoscopy;  Laterality: N/A;  . POLYPECTOMY N/A 05/11/2016    Procedure: REMOVAL OF ENDOMETRIAL POLYP;  Surgeon: John V Ferguson, MD;  Location: [facility];  Service: Gynecology;  Laterality: N/A;  . RESECTION DISTAL CLAVICAL Right 01/30/2019   Procedure: RESECTION DISTAL CLAVICAL;  Surgeon: Harrison, Stanley E, MD;  Location: [facility];  Service: Orthopedics;  Laterality: Right;  . SHOULDER OPEN ROTATOR CUFF REPAIR Right 01/30/2019   Procedure: ROTATOR CUFF REPAIR SHOULDER OPEN;  Surgeon: Harrison, Stanley E, MD;  Location: [facility];  Service: Orthopedics;  Laterality: Right;  pt to arrive at 7:30 for PICC at 8:00  . TOTAL KNEE ARTHROPLASTY Left 02/05/2015   Procedure: LEFT TOTAL KNEE ARTHROPLASTY;  Surgeon: Stanley E Harrison, MD;  Location: [facility];  Service: Orthopedics;  Laterality: Left;    There were no vitals filed for this visit.  Subjective Assessment - 05/18/19 1009    Subjective   S:  It might rain later, thats when I get sore.  I saw Dr. Harrison, he said I am coming along nicely.         [facility] OT Assessment - 05/18/19 0001      Assessment   Medical Diagnosis  s/p Right Open RCR    Referring Provider (OT)  Dr. Stanley Harrison      Precautions   Precautions  Shoulder    Type of Shoulder Precautions  progress as tolerated                OT Treatments/Exercises (OP) - 05/18/19 0001      Exercises   Exercises  Shoulder      Shoulder Exercises: Supine   Protraction  PROM;5 reps;Strengthening;12 reps    Protraction Weight (lbs)  1    Horizontal ABduction  PROM;5 reps;Strengthening;12 reps    Horizontal ABduction Weight (lbs)  1    External Rotation  PROM;5 reps;Strengthening;12 reps    External Rotation Weight (lbs)  1    Internal Rotation  PROM;5 reps;Strengthening;12 reps    Internal Rotation Weight (lbs)  1    Flexion  PROM;5 reps;Strengthening;12 reps    Shoulder Flexion Weight (lbs)  1    ABduction  PROM;5 reps;Strengthening;12 reps    Shoulder ABduction Weight (lbs)  1      Shoulder Exercises: Seated   Protraction   Strengthening;12 reps    Protraction Weight (lbs)  1    Horizontal ABduction  Strengthening;12 reps    Horizontal ABduction Weight (lbs)  1    External Rotation  Strengthening;12 reps    External Rotation Weight (lbs)  1    Internal Rotation  Strengthening;12 reps    Internal Rotation Weight (lbs)  1    Flexion  Strengthening;12 reps    Flexion Weight (lbs)  1    Abduction  Strengthening;12 reps    ABduction Weight (lbs)  1      Shoulder Exercises: ROM/Strengthening   Proximal Shoulder Strengthening, Supine  10 times with no rest,   1# with moderate cuing for technique from therapist    Proximal Shoulder Strengthening, Seated  12X A/ROM with verbal and visual cuing with 1# resistance     Graduated Retraction with Theraband  with red theraband loop walked arms from waist to overhead height on wall and back down 3 sets - unable to do additional 2 reps      Other ROM/Strengthening Exercises  holding large green therapy ball completed chest press, overhead press, flexion, pulses up/down, side/side, left hand top, right hand top 10 times each       Manual Therapy   Manual Therapy  Myofascial release    Manual therapy comments  completed separately from therapeutic exercises    Myofascial Release  myofascial release and manual therapy complete to right upper arm, trapezius, and scapular regions to decrease fascial restrictions and improve pain and ROM               OT Short Term Goals - 05/14/19 1720      OT SHORT TERM GOAL #1   Title  Patient will be educated and independent with HEP to faciliate progress in therapy and allow her to return to using her RUE as her dominant extremity.     Time  4    Period  Weeks    Status  On-going    Target Date  04/15/19      OT SHORT TERM GOAL #2   Title  Patient will increase RUE P/ROM to WNL in order to increase ability to complete dressing tasks with less difficulty.     Time  4    Period  Weeks      OT SHORT TERM GOAL #3   Title   Patient will report a decrease in pain level of 5/10 to improve ability to sleep in the bed vs the couch.    Time  4    Period  Weeks        OT Long Term Goals - 05/14/19 1720      OT LONG TERM GOAL #1   Title  Patient will increase her RUE A/ROM to WNL to improve ability to reach into overhead cabinets.    Time  8    Period  Weeks      OT LONG TERM GOAL #2   Title  Patient will increase her RUE strength to 4+/5 to improve ability to perform gardening tasks using RUE as dominant.    Time  8    Period  Weeks    Status  On-going      OT LONG TERM GOAL #3   Title  Patient will decrease her fascial restrictions to trace amount or less in order to increase mobility required to complete functional reaching tasks.    Baseline  goal was for min restrictions, which she has met on 05/10/19 - updated goal for trace restrictions for improved pain control.    Time  8    Period  Weeks    Status  On-going      OT LONG TERM GOAL #4   Title  Patient will report a decrease in pain of approximately 3/10 or less in her RUE while completing household tasks with her right arm.    Time  8    Period  Weeks      OT LONG TERM GOAL #5   Title  Pt will return to highest level of functioning and independence using RUE as dominant.    Time  8  Period  Weeks    Status  On-going            Plan - 05/18/19 1351    Clinical Impression Statement  A:  Patient required more frequent rest breaks this date, stating her asthma was bothering her.  required tactile and verbal cues with supine and seated strengthening for correct form wtih abduction and external rotation.  Added strengthening exercises with red tband loop which were moderately difficult for patient.    Body Structure / Function / Physical Skills  ADL;UE functional use;Fascial restriction;Pain;ROM;IADL;Strength    Plan  P:  increase from 3 to 5 wall walks with red therband loop, increase reps with large therapy ball exercises for greater  shoulder strength and sustained activity tolerance.       Patient will benefit from skilled therapeutic intervention in order to improve the following deficits and impairments:   Body Structure / Function / Physical Skills: ADL, UE functional use, Fascial restriction, Pain, ROM, IADL, Strength       Visit Diagnosis: Other symptoms and signs involving the musculoskeletal system  Stiffness of right shoulder, not elsewhere classified  Acute pain of right shoulder    Problem List Patient Active Problem List   Diagnosis Date Noted  . S/P right rotator cuff repair 01/30/19 02/06/2019  . Nontraumatic complete tear of right rotator cuff   . Arthritis of right acromioclavicular joint   . S/P total knee replacement, 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/24/2018  . Shoulder impingement, right 05/24/2018  . Dehydration   . Intractable cyclical vomiting 55/73/2202  . Pyloric stenosis in adult   . Thrombocytopenia ([place]) 12/20/2017  . Leukocytosis   . Malnutrition of moderate degree 12/17/2017  . Acute renal failure (ARF) ([place]) 12/15/2017  . Chronic abdominal pain 12/15/2017  . Gastroenteritis 06/14/2016  . Nausea with vomiting 06/10/2016  . Uterine enlargement 06/09/2016  . Intractable nausea and vomiting 06/08/2016  . Acute infective gastroenteritis 06/08/2016  . Diarrhea 06/08/2016  . Essential hypertension 06/08/2016  . GERD (gastroesophageal reflux disease) 06/08/2016  . Abdominal pain   . Endometrial polyp 04/15/2016  . PMB (postmenopausal bleeding) 02/27/2016  . Alcoholic cirrhosis of liver without ascites ([place])   . Asthma 01/21/2016  . Hepatic cirrhosis ([place]) 09/22/2015  . Arthritis of knee, degenerative 02/05/2015  . History of Helicobacter pylori infection 10/30/2014  . Chronic [name] with cirrhosis ([place]) 04/11/2014  . De Quervain's disease (radial styloid tenosynovitis) 12/25/2013  . Anorexia 11/21/2012  . FH: colon cancer 11/21/2012  . Early satiety 10/25/2012  . Bowel habit changes  10/25/2012  . Abdominal pain, epigastric 10/25/2012  . Abdominal bloating 10/25/2012  . Constipation 10/25/2012  . Abnormal weight loss 10/25/2012  . Chronic viral [name] ([place]) 10/25/2012  . Radicular pain of left lower extremity 09/28/2012  . Back pain 09/28/2012  . Sciatica 08/10/2011  . S/P arthroscopy of left knee 08/10/2011  . Tibial plateau fracture 08/10/2011  . Pain in joint, lower leg 02/12/2011  . Stiffness of joint, not elsewhere classified, lower leg 02/12/2011  . 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 Meniscus, medial, derangement 12/29/2010  . CLOSED FRACTURE OF UPPER END OF TIBIA 08/12/2010    Vangie Bicker, [place], OTR/L [phone]  05/18/2019, 1:59 PM  [place], [place], [address] Phone: [phone]   Fax:  [phone]  Name: Colleen Lee MRN: [id number] Date of Birth: 06-21-1956

## 2019-05-22 ENCOUNTER — Other Ambulatory Visit: Payer: Self-pay | Admitting: Orthopedic Surgery

## 2019-05-22 DIAGNOSIS — G8918 Other acute postprocedural pain: Secondary | ICD-10-CM

## 2019-05-22 MED ORDER — HYDROCODONE-ACETAMINOPHEN 5-325 MG PO TABS: 1.0000 | ORAL_TABLET | Freq: Four times a day (QID) | ORAL | 0 refills | Status: DC | PRN

## 2019-05-22 NOTE — Telephone Encounter (Signed)
Patient requests refill on Hydrocodone/Acetaminophen 5-325  Mgs.  Qty  28 ° °Sig: Take 1 tablet by mouth every 6 (six) hours as needed for up to 7 days for moderate pain. ° °Patient states she uses [facility] °

## 2019-05-23 ENCOUNTER — Encounter (HOSPITAL_COMMUNITY): Payer: Self-pay | Admitting: Occupational Therapy

## 2019-05-23 ENCOUNTER — Other Ambulatory Visit: Payer: Self-pay

## 2019-05-23 ENCOUNTER — Ambulatory Visit (HOSPITAL_COMMUNITY): Payer: Medicare Other | Admitting: Occupational Therapy

## 2019-05-23 DIAGNOSIS — M25611 Stiffness of right shoulder, not elsewhere classified: Secondary | ICD-10-CM

## 2019-05-23 DIAGNOSIS — R29898 Other symptoms and signs involving the musculoskeletal system: Secondary | ICD-10-CM

## 2019-05-23 DIAGNOSIS — M25511 Pain in right shoulder: Secondary | ICD-10-CM | POA: Diagnosis not present

## 2019-05-23 NOTE — Therapy (Signed)
[place], [place], [address] Phone: [phone]   Fax:  [phone]  Occupational Therapy Treatment  Patient Details  Name: Colleen Lee MRN: [id number] Date of Birth: 1956/06/28 Referring Provider (OT): Dr. Arther Abbott   Encounter Date: 05/23/2019  OT End of Session - 05/23/19 1057    Visit Number  19    Number of Visits  24    Date for OT Re-Evaluation  06/07/19    Authorization Type  1) [employer] Medicare 2) Medicaid    Authorization Time Period  visits based on medical necessity. progress note on visit 26.    Authorization - Visit Number  19    Authorization - Number of Visits  26    OT Start Time  0950    OT Stop Time  1029    OT Time Calculation (min)  39 min    Activity Tolerance  Patient tolerated treatment well    Behavior During Therapy  WFL for tasks assessed/performed       Past Medical History:  Diagnosis Date  . Asthma   . Chronic abdominal pain   . Chronic back pain   . Chronic constipation   . Cirrhosis ([place])    Metavir score F4 on elastography 2015  . Cyclical vomiting   . Fibroids   . Fibromyalgia   . GERD (gastroesophageal reflux disease)   . H. pylori infection 2014   treated with pylera, had to be treated again as it was not eradicated. Urea breath test then negative after subsequent treatment.   . [name]    HCV RNA positive 09/2012  . Hypertension   . Marijuana use   . Nausea and vomiting    chronic, recurrent  . PONV (postoperative nausea and vomiting)    pt thinks maybe once she had N&V  . Sciatica of left side     Past Surgical History:  Procedure Laterality Date  . BALLOON DILATION  12/20/2017   Procedure: BALLOON DILATION;  Surgeon: Danie Binder, MD;  [facility];  Service: Endoscopy;;  pyloric dilation  . BIOPSY  12/20/2017   Procedure: BIOPSY;  Surgeon: Danie Binder, MD;  [facility];  Service: Endoscopy;;  duodenal and gastric  biopsy  . COLONOSCOPY  01/18/2008   [id number] rectum.  Long redundant colon, a diminutive sigmoid polyp status post cold biopsy removed. Hyperplastic polyp. Repeat colonoscopy June 2014 due to family history of colon cancer  . COLONOSCOPY WITH ESOPHAGOGASTRODUODENOSCOPY (EGD) N/A 11/02/2012   DZH:GDJMEQAS gastric mucosa of doubtful, +H.pylori. Incomplete colonoscopy due to patient unable to tolerate exam, proximal colon seen. Patient refused ACBE.  Marland Kitchen COLONOSCOPY WITH PROPOFOL N/A 03/08/2013   Dr. Gala Romney: colonic polyp-removed as scribed above. Internal Hemorrhoids. Pathology did not reveal any colonic tissue, only mucus. SURVEILLANCE DUE Aug 2019  . COLONOSCOPY WITH PROPOFOL N/A 01/19/2018   Dr. Gala Romney: 6 mm cecal inflammatoy polyp, otherwise normal., Surveilance in 5 year s  . ESOPHAGEAL DILATION  12/20/2017   Procedure: ESOPHAGEAL DILATION;  Surgeon: Danie Binder, MD;  [facility];  Service: Endoscopy;;  . ESOPHAGOGASTRODUODENOSCOPY  01/18/2008   RMR: Normal esophagus, normal  stomach  . ESOPHAGOGASTRODUODENOSCOPY (EGD) WITH PROPOFOL N/A 02/09/2016   Normal esophagus, small hiatal hernia, portal hypertensive gastropathy, normal second portion of duodenum. Due in July 2019  . ESOPHAGOGASTRODUODENOSCOPY (EGD) WITH PROPOFOL N/A 12/20/2017   Dr. Oneida Alar: benign appearing esophageal stenosis s/p dilation, mild pyloric stenosis s/p  biopsy and dilation, mild duodenitis.   Marland Kitchen HYSTEROSCOPY  05/11/2016   Procedure: HYSTEROSCOPY;  Surgeon: Jonnie Kind, MD;  Location: [facility];  Service: Gynecology;;  . KNEE SURGERY     left knee  . MULTIPLE EXTRACTIONS WITH ALVEOLOPLASTY N/A 10/15/2013   Procedure: MULTIPLE EXTRACTION WITH ALVEOLOPLASTY;  Surgeon: Gae Bon, DDS;  Location: [facility];  Service: Oral Surgery;  Laterality: N/A;  . POLYPECTOMY N/A 03/08/2013   Procedure: POLYPECTOMY;  Surgeon: Daneil Dolin, MD;  Location[facility];  Service: Endoscopy;  Laterality: N/A;  . POLYPECTOMY N/A 05/11/2016    Procedure: REMOVAL OF ENDOMETRIAL POLYP;  Surgeon: Jonnie Kind, MD;  Location: [facility];  Service: Gynecology;  Laterality: N/A;  . RESECTION DISTAL CLAVICAL Right 01/30/2019   Procedure: RESECTION DISTAL CLAVICAL;  Surgeon: Carole Civil, MD;  Location: [facility];  Service: Orthopedics;  Laterality: Right;  . SHOULDER OPEN ROTATOR CUFF REPAIR Right 01/30/2019   Procedure: ROTATOR CUFF REPAIR SHOULDER OPEN;  Surgeon: Carole Civil, MD;  Location: [facility];  Service: Orthopedics;  Laterality: Right;  pt to arrive at 7:30 for PICC at 8:00  . TOTAL KNEE ARTHROPLASTY Left 02/05/2015   Procedure: LEFT TOTAL KNEE ARTHROPLASTY;  Surgeon: Carole Civil, MD;  Location: [facility];  Service: Orthopedics;  Laterality: Left;    There were no vitals filed for this visit.  Subjective Assessment - 05/23/19 0952    Subjective   S: I'm just sore all over today.    Currently in Pain?  Yes    Pain Score  5     Pain Location  Shoulder    Pain Orientation  Right    Pain Descriptors / Indicators  Aching;Sore    Pain Type  Acute pain    Pain Radiating Towards  N/A    Pain Onset  Yesterday    Pain Frequency  Constant    Aggravating Factors   movement    Pain Relieving Factors  none    Effect of Pain on Daily Activities  mod effect on ADLs    Multiple Pain Sites  No         [facility] OT Assessment - 05/23/19 0951      Assessment   Medical Diagnosis  s/p Right Open RCR    Referring Provider (OT)  Dr. Arther Abbott      Precautions   Precautions  Shoulder    Type of Shoulder Precautions  progress as tolerated                OT Treatments/Exercises (OP) - 05/23/19 0953      Exercises   Exercises  Shoulder      Shoulder Exercises: Supine   Protraction  PROM;5 reps;Strengthening;12 reps    Protraction Weight (lbs)  1    Horizontal ABduction  PROM;5 reps;Strengthening;12 reps    Horizontal ABduction Weight (lbs)  1    External Rotation  PROM;5 reps;Strengthening;12 reps     External Rotation Weight (lbs)  1    Internal Rotation  PROM;5 reps;Strengthening;12 reps    Internal Rotation Weight (lbs)  1    Flexion  PROM;5 reps;Strengthening;12 reps    Shoulder Flexion Weight (lbs)  1    ABduction  PROM;5 reps;Strengthening;12 reps    Shoulder ABduction Weight (lbs)  1      Shoulder Exercises: Seated   Protraction  Strengthening;12 reps    Protraction Weight (lbs)  1    Horizontal ABduction  Strengthening;12 reps  Horizontal ABduction Weight (lbs)  1    External Rotation  Strengthening;12 reps    External Rotation Weight (lbs)  1    Internal Rotation  Strengthening;12 reps    Internal Rotation Weight (lbs)  1    Flexion  Strengthening;12 reps    Flexion Weight (lbs)  1    Abduction  Strengthening;12 reps    ABduction Weight (lbs)  1      Shoulder Exercises: Therapy Ball   Other Therapy Ball Exercises  small green therapy ball: chest press, overhead press, flexion, circles each direction, 12X each      Shoulder Exercises: ROM/Strengthening   X to V Arms  10X, 1#     Proximal Shoulder Strengthening, Supine  10 times with no rest, 1# with moderate cuing for technique from therapist    Proximal Shoulder Strengthening, Seated  10X, 1#, no rest breaks    Graduated Retraction with Theraband  red loop band: walked arms from waist to overhead height on wall and back 5X, lateral wall slides 10X with 1 rest break      Manual Therapy   Manual Therapy  Myofascial release    Manual therapy comments  completed separately from therapeutic exercises    Myofascial Release  myofascial release and manual therapy complete to right upper arm, trapezius, and scapular regions to decrease fascial restrictions and improve pain and ROM               OT Short Term Goals - 05/14/19 1720      OT SHORT TERM GOAL #1   Title  Patient will be educated and independent with HEP to faciliate progress in therapy and allow her to return to using her RUE as her dominant extremity.      Time  4    Period  Weeks    Status  On-going    Target Date  04/15/19      OT SHORT TERM GOAL #2   Title  Patient will increase RUE P/ROM to WNL in order to increase ability to complete dressing tasks with less difficulty.     Time  4    Period  Weeks      OT SHORT TERM GOAL #3   Title  Patient will report a decrease in pain level of 5/10 to improve ability to sleep in the bed vs the couch.    Time  4    Period  Weeks        OT Long Term Goals - 05/14/19 1720      OT LONG TERM GOAL #1   Title  Patient will increase her RUE A/ROM to WNL to improve ability to reach into overhead cabinets.    Time  8    Period  Weeks      OT LONG TERM GOAL #2   Title  Patient will increase her RUE strength to 4+/5 to improve ability to perform gardening tasks using RUE as dominant.    Time  8    Period  Weeks    Status  On-going      OT LONG TERM GOAL #3   Title  Patient will decrease her fascial restrictions to trace amount or less in order to increase mobility required to complete functional reaching tasks.    Baseline  goal was for min restrictions, which she has met on 05/10/19 - updated goal for trace restrictions for improved pain control.    Time  8    Period  Weeks  Status  On-going      OT LONG TERM GOAL #4   Title  Patient will report a decrease in pain of approximately 3/10 or less in her RUE while completing household tasks with her right arm.    Time  8    Period  Weeks      OT LONG TERM GOAL #5   Title  Pt will return to highest level of functioning and independence using RUE as dominant.    Time  8    Period  Weeks    Status  On-going            Plan - 05/23/19 1057    Clinical Impression Statement  A: Manual therapy completed to address fascial restrictions, trace restrictions palpated in upper arm, mod muscle knot in trapezius regions. Continued with strengthening utilizing hand weights, green therapy ball, and red looped theraband. Added lateral wall  slides with red loop band today for scapular stability. Multiple rest breaks required during session. Verbal cuing for form and technique.    Body Structure / Function / Physical Skills  ADL;UE functional use;Fascial restriction;Pain;ROM;IADL;Strength    Plan  P: Continue with strengthening tasks, complete scapular theraband, red theraband for er       Patient will benefit from skilled therapeutic intervention in order to improve the following deficits and impairments:   Body Structure / Function / Physical Skills: ADL, UE functional use, Fascial restriction, Pain, ROM, IADL, Strength       Visit Diagnosis: Other symptoms and signs involving the musculoskeletal system  Stiffness of right shoulder, not elsewhere classified  Acute pain of right shoulder    Problem List Patient Active Problem List   Diagnosis Date Noted  . S/P right rotator cuff repair 01/30/19 02/06/2019  . Nontraumatic complete tear of right rotator cuff   . Arthritis of right acromioclavicular joint   . S/P total knee replacement, 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/24/2018  . Shoulder impingement, right 05/24/2018  . Dehydration   . Intractable cyclical vomiting 67/34/1937  . Pyloric stenosis in adult   . Thrombocytopenia ([place]) 12/20/2017  . Leukocytosis   . Malnutrition of moderate degree 12/17/2017  . Acute renal failure (ARF) ([place]) 12/15/2017  . Chronic abdominal pain 12/15/2017  . Gastroenteritis 06/14/2016  . Nausea with vomiting 06/10/2016  . Uterine enlargement 06/09/2016  . Intractable nausea and vomiting 06/08/2016  . Acute infective gastroenteritis 06/08/2016  . Diarrhea 06/08/2016  . Essential hypertension 06/08/2016  . GERD (gastroesophageal reflux disease) 06/08/2016  . Abdominal pain   . Endometrial polyp 04/15/2016  . PMB (postmenopausal bleeding) 02/27/2016  . Alcoholic cirrhosis of liver without ascites ([place])   . Asthma 01/21/2016  . Hepatic cirrhosis ([place]) 09/22/2015  . Arthritis of knee, degenerative  02/05/2015  . History of Helicobacter pylori infection 10/30/2014  . Chronic [name] with cirrhosis ([place]) 04/11/2014  . De Quervain's disease (radial styloid tenosynovitis) 12/25/2013  . Anorexia 11/21/2012  . FH: colon cancer 11/21/2012  . Early satiety 10/25/2012  . Bowel habit changes 10/25/2012  . Abdominal pain, epigastric 10/25/2012  . Abdominal bloating 10/25/2012  . Constipation 10/25/2012  . Abnormal weight loss 10/25/2012  . Chronic viral [name] ([place]) 10/25/2012  . Radicular pain of left lower extremity 09/28/2012  . Back pain 09/28/2012  . Sciatica 08/10/2011  . S/P arthroscopy of left knee 08/10/2011  . Tibial plateau fracture 08/10/2011  . Pain in joint, lower leg 02/12/2011  . Stiffness of joint, not elsewhere classified, lower leg 02/12/2011  .  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 Meniscus, medial, derangement 12/29/2010  . CLOSED FRACTURE OF UPPER END OF TIBIA 08/12/2010   Guadelupe Sabin, OTR/L  [phone] 05/23/2019, 11:00 AM  [place], [place], [address] Phone: [phone]   Fax:  [phone]  Name: MALAYLA GRANBERRY MRN: [id number] Date of Birth: [birth year]

## 2019-05-25 ENCOUNTER — Encounter (HOSPITAL_COMMUNITY): Payer: Self-pay | Admitting: Occupational Therapy

## 2019-05-25 ENCOUNTER — Other Ambulatory Visit: Payer: Self-pay

## 2019-05-25 ENCOUNTER — Ambulatory Visit (HOSPITAL_COMMUNITY): Payer: Medicare Other | Admitting: Occupational Therapy

## 2019-05-25 DIAGNOSIS — R29898 Other symptoms and signs involving the musculoskeletal system: Secondary | ICD-10-CM | POA: Diagnosis not present

## 2019-05-25 DIAGNOSIS — M25611 Stiffness of right shoulder, not elsewhere classified: Secondary | ICD-10-CM

## 2019-05-25 DIAGNOSIS — M25511 Pain in right shoulder: Secondary | ICD-10-CM

## 2019-05-25 NOTE — Therapy (Signed)
[place], [place], [address] Phone: [phone]   Fax:  [phone]  Occupational Therapy Treatment  Patient Details  Name: Colleen Lee MRN: [id number] Date of Birth: 11/09/1955 Referring Provider (OT): Dr. Arther Abbott   Encounter Date: 05/25/2019  OT End of Session - 05/25/19 1154    Visit Number  20    Number of Visits  24    Date for OT Re-Evaluation  06/07/19    Authorization Type  1) UHC Medicare 2) Medicaid    Authorization Time Period  visits based on medical necessity. progress note on visit 26.    Authorization - Visit Number  20    Authorization - Number of Visits  26    OT Start Time  2751    OT Stop Time  1155    OT Time Calculation (min)  38 min    Activity Tolerance  Patient tolerated treatment well    Behavior During Therapy  WFL for tasks assessed/performed       Past Medical History:  Diagnosis Date  . Asthma   . Chronic abdominal pain   . Chronic back pain   . Chronic constipation   . Cirrhosis ([place])    Metavir score F4 on elastography 2015  . Cyclical vomiting   . Fibroids   . Fibromyalgia   . GERD (gastroesophageal reflux disease)   . H. pylori infection 2014   treated with pylera, had to be treated again as it was not eradicated. Urea breath test then negative after subsequent treatment.   . [name]    HCV RNA positive 09/2012  . Hypertension   . Marijuana use   . Nausea and vomiting    chronic, recurrent  . PONV (postoperative nausea and vomiting)    pt thinks maybe once she had N&V  . Sciatica of left side     Past Surgical History:  Procedure Laterality Date  . BALLOON DILATION  12/20/2017   Procedure: BALLOON DILATION;  Surgeon: Danie Binder, MD;  [facility];  Service: Endoscopy;;  pyloric dilation  . BIOPSY  12/20/2017   Procedure: BIOPSY;  Surgeon: Danie Binder, MD;  [facility];  Service: Endoscopy;;  duodenal and gastric  biopsy  . COLONOSCOPY  01/18/2008   [id number] rectum.  Long redundant colon, a diminutive sigmoid polyp status post cold biopsy removed. Hyperplastic polyp. Repeat colonoscopy June 2014 due to family history of colon cancer  . COLONOSCOPY WITH ESOPHAGOGASTRODUODENOSCOPY (EGD) N/A 11/02/2012   [id number] gastric mucosa of doubtful, +H.pylori. Incomplete colonoscopy due to patient unable to tolerate exam, proximal colon seen. Patient refused ACBE.  Marland Kitchen COLONOSCOPY WITH PROPOFOL N/A 03/08/2013   Dr. Gala Romney: colonic polyp-removed as scribed above. Internal Hemorrhoids. Pathology did not reveal any colonic tissue, only mucus. SURVEILLANCE DUE Aug 2019  . COLONOSCOPY WITH PROPOFOL N/A 01/19/2018   Dr. Gala Romney: 6 mm cecal inflammatoy polyp, otherwise normal., Surveilance in 5 year s  . ESOPHAGEAL DILATION  12/20/2017   Procedure: ESOPHAGEAL DILATION;  Surgeon: Danie Binder, MD;  [facility];  Service: Endoscopy;;  . ESOPHAGOGASTRODUODENOSCOPY  01/18/2008   RMR: Normal esophagus, normal  stomach  . ESOPHAGOGASTRODUODENOSCOPY (EGD) WITH PROPOFOL N/A 02/09/2016   Normal esophagus, small hiatal hernia, portal hypertensive gastropathy, normal second portion of duodenum. Due in July 2019  . ESOPHAGOGASTRODUODENOSCOPY (EGD) WITH PROPOFOL N/A 12/20/2017   Dr. Oneida Alar: benign appearing esophageal stenosis s/p dilation, mild pyloric stenosis s/p  biopsy and dilation, mild duodenitis.   Marland Kitchen HYSTEROSCOPY  05/11/2016   Procedure: HYSTEROSCOPY;  Surgeon: Jonnie Kind, MD;  [facility];  Service: Gynecology;;  . KNEE SURGERY     left knee  . MULTIPLE EXTRACTIONS WITH ALVEOLOPLASTY N/A 10/15/2013   Procedure: MULTIPLE EXTRACTION WITH ALVEOLOPLASTY;  Surgeon: Gae Bon, DDS;  Location: [facility];  Service: Oral Surgery;  Laterality: N/A;  . POLYPECTOMY N/A 03/08/2013   Procedure: POLYPECTOMY;  Surgeon: Daneil Dolin, MD;  [facility];  Service: Endoscopy;  Laterality: N/A;  . POLYPECTOMY N/A 05/11/2016    Procedure: REMOVAL OF ENDOMETRIAL POLYP;  Surgeon: Jonnie Kind, MD;  [facility];  Service: Gynecology;  Laterality: N/A;  . RESECTION DISTAL CLAVICAL Right 01/30/2019   Procedure: RESECTION DISTAL CLAVICAL;  Surgeon: Carole Civil, MD;  Location[facility];  Service: Orthopedics;  Laterality: Right;  . SHOULDER OPEN ROTATOR CUFF REPAIR Right 01/30/2019   Procedure: ROTATOR CUFF REPAIR SHOULDER OPEN;  Surgeon: Carole Civil, MD;  [facility];  Service: Orthopedics;  Laterality: Right;  pt to arrive at 7:30 for PICC at 8:00  . TOTAL KNEE ARTHROPLASTY Left 02/05/2015   Procedure: LEFT TOTAL KNEE ARTHROPLASTY;  Surgeon: Carole Civil, MD;  [facility];  Service: Orthopedics;  Laterality: Left;    There were no vitals filed for this visit.  Subjective Assessment - 05/25/19 1109    Subjective   S: It's feeling pretty good today.    Currently in Pain?  No/denies         [facility] OT Assessment - 05/25/19 1109      Assessment   Medical Diagnosis  s/p Right Open RCR      Precautions   Precautions  Shoulder    Type of Shoulder Precautions  progress as tolerated                OT Treatments/Exercises (OP) - 05/25/19 1119      Exercises   Exercises  Shoulder      Shoulder Exercises: Supine   Protraction  PROM;5 reps;Strengthening;12 reps    Protraction Weight (lbs)  1    Horizontal ABduction  PROM;5 reps;Strengthening;12 reps    Horizontal ABduction Weight (lbs)  1    External Rotation  PROM;5 reps;Strengthening;12 reps    External Rotation Weight (lbs)  1    Internal Rotation  PROM;5 reps;Strengthening;12 reps    Internal Rotation Weight (lbs)  1    Flexion  PROM;5 reps;Strengthening;12 reps    Shoulder Flexion Weight (lbs)  1    ABduction  PROM;5 reps;Strengthening;12 reps    Shoulder ABduction Weight (lbs)  1      Shoulder Exercises: Sidelying   External Rotation  AROM;10 reps    Internal Rotation  AROM;10 reps    Flexion  AROM;10 reps     ABduction  AROM;10 reps    Other Sidelying Exercises  protraction, A/ROM, 10X    Other Sidelying Exercises  horizontal abduction, A/ROM, 10X      Shoulder Exercises: Therapy Ball   Other Therapy Ball Exercises  small green therapy ball: chest press, overhead press, flexion, circles each direction, 12X each      Shoulder Exercises: ROM/Strengthening   Proximal Shoulder Strengthening, Supine  10 times with no rest, 1# with moderate cuing for technique from therapist    Graduated Retraction with Theraband  red loop band: walked arms from waist to overhead height on wall and back 10X, lateral wall slides  10X with 1 rest break      Manual Therapy   Manual Therapy  Myofascial release    Manual therapy comments  completed separately from therapeutic exercises    Myofascial Release  myofascial release and manual therapy complete to right upper arm, trapezius, and scapular regions to decrease fascial restrictions and improve pain and ROM               OT Short Term Goals - 05/14/19 1720      OT SHORT TERM GOAL #1   Title  Patient will be educated and independent with HEP to faciliate progress in therapy and allow her to return to using her RUE as her dominant extremity.     Time  4    Period  Weeks    Status  On-going    Target Date  04/15/19      OT SHORT TERM GOAL #2   Title  Patient will increase RUE P/ROM to WNL in order to increase ability to complete dressing tasks with less difficulty.     Time  4    Period  Weeks      OT SHORT TERM GOAL #3   Title  Patient will report a decrease in pain level of 5/10 to improve ability to sleep in the bed vs the couch.    Time  4    Period  Weeks        OT Long Term Goals - 05/14/19 1720      OT LONG TERM GOAL #1   Title  Patient will increase her RUE A/ROM to WNL to improve ability to reach into overhead cabinets.    Time  8    Period  Weeks      OT LONG TERM GOAL #2   Title  Patient will increase her RUE strength to 4+/5 to  improve ability to perform gardening tasks using RUE as dominant.    Time  8    Period  Weeks    Status  On-going      OT LONG TERM GOAL #3   Title  Patient will decrease her fascial restrictions to trace amount or less in order to increase mobility required to complete functional reaching tasks.    Baseline  goal was for min restrictions, which she has met on 05/10/19 - updated goal for trace restrictions for improved pain control.    Time  8    Period  Weeks    Status  On-going      OT LONG TERM GOAL #4   Title  Patient will report a decrease in pain of approximately 3/10 or less in her RUE while completing household tasks with her right arm.    Time  8    Period  Weeks      OT LONG TERM GOAL #5   Title  Pt will return to highest level of functioning and independence using RUE as dominant.    Time  8    Period  Weeks    Status  On-going            Plan - 05/25/19 1146    Clinical Impression Statement  A: Continued with shoulder strengthening today, added sidelying A/ROM for scapular stability-pt with mod fatigue at end of task. Continued with green therapy ball exercises and red loop band as well, multiple rest breaks required. Verbal cuing for form and technique during session.    Body Structure / Function / Physical Skills  ADL;UE  functional use;Fascial restriction;Pain;ROM;IADL;Strength    Plan  P: Continue with strengthening, complete sidelying versus supine and resume overhead lacing       Patient will benefit from skilled therapeutic intervention in order to improve the following deficits and impairments:   Body Structure / Function / Physical Skills: ADL, UE functional use, Fascial restriction, Pain, ROM, IADL, Strength       Visit Diagnosis: Other symptoms and signs involving the musculoskeletal system  Stiffness of right shoulder, not elsewhere classified  Acute pain of right shoulder    Problem List Patient Active Problem List   Diagnosis Date Noted   . S/P right rotator cuff repair 01/30/19 02/06/2019  . Nontraumatic complete tear of right rotator cuff   . Arthritis of right acromioclavicular joint   . S/P total knee replacement, 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/24/2018  . Shoulder impingement, right 05/24/2018  . Dehydration   . Intractable cyclical vomiting 73/71/0626  . Pyloric stenosis in adult   . Thrombocytopenia ([place]) 12/20/2017  . Leukocytosis   . Malnutrition of moderate degree 12/17/2017  . Acute renal failure (ARF) ([place]) 12/15/2017  . Chronic abdominal pain 12/15/2017  . Gastroenteritis 06/14/2016  . Nausea with vomiting 06/10/2016  . Uterine enlargement 06/09/2016  . Intractable nausea and vomiting 06/08/2016  . Acute infective gastroenteritis 06/08/2016  . Diarrhea 06/08/2016  . Essential hypertension 06/08/2016  . GERD (gastroesophageal reflux disease) 06/08/2016  . Abdominal pain   . Endometrial polyp 04/15/2016  . PMB (postmenopausal bleeding) 02/27/2016  . Alcoholic cirrhosis of liver without ascites ([place])   . Asthma 01/21/2016  . Hepatic cirrhosis ([place]) 09/22/2015  . Arthritis of knee, degenerative 02/05/2015  . History of Helicobacter pylori infection 10/30/2014  . Chronic [name] with cirrhosis ([place]) 04/11/2014  . De Quervain's disease (radial styloid tenosynovitis) 12/25/2013  . Anorexia 11/21/2012  . FH: colon cancer 11/21/2012  . Early satiety 10/25/2012  . Bowel habit changes 10/25/2012  . Abdominal pain, epigastric 10/25/2012  . Abdominal bloating 10/25/2012  . Constipation 10/25/2012  . Abnormal weight loss 10/25/2012  . Chronic viral [name] ([place]) 10/25/2012  . Radicular pain of left lower extremity 09/28/2012  . Back pain 09/28/2012  . Sciatica 08/10/2011  . S/P arthroscopy of left knee 08/10/2011  . Tibial plateau fracture 08/10/2011  . Pain in joint, lower leg 02/12/2011  . Stiffness of joint, not elsewhere classified, lower leg 02/12/2011  . 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 Meniscus,  medial, derangement 12/29/2010  . CLOSED FRACTURE OF UPPER END OF TIBIA 08/12/2010   Guadelupe Sabin, OTR/L  [phone] 05/25/2019, 11:55 AM  [facility] [address] [place], [place], [address] Phone: [phone]   Fax:  [phone]  Name: NETTY SULLIVANT MRN: [id number] Date of Birth: 1956-05-14

## 2019-05-28 ENCOUNTER — Encounter (HOSPITAL_COMMUNITY): Payer: Self-pay

## 2019-05-28 ENCOUNTER — Ambulatory Visit (HOSPITAL_COMMUNITY): Payer: Medicare Other

## 2019-05-28 ENCOUNTER — Other Ambulatory Visit: Payer: Self-pay

## 2019-05-28 DIAGNOSIS — M25511 Pain in right shoulder: Secondary | ICD-10-CM | POA: Diagnosis not present

## 2019-05-28 DIAGNOSIS — R29898 Other symptoms and signs involving the musculoskeletal system: Secondary | ICD-10-CM

## 2019-05-28 DIAGNOSIS — M25611 Stiffness of right shoulder, not elsewhere classified: Secondary | ICD-10-CM | POA: Diagnosis not present

## 2019-05-28 NOTE — Therapy (Signed)
[place], [place], [address] Phone: [phone]   Fax:  [phone]  Occupational Therapy Treatment  Patient Details  Name: Colleen Lee MRN: [id number] Date of Birth: 04-12-1956 Referring Provider (OT): Dr. Arther Abbott   Encounter Date: 05/28/2019  OT End of Session - 05/28/19 1145    Visit Number  21    Number of Visits  24    Date for OT Re-Evaluation  06/07/19    Authorization Type  1) UHC Medicare 2) Medicaid    Authorization Time Period  visits based on medical necessity. progress note on visit 26.    Authorization - Visit Number  21    Authorization - Number of Visits  26    OT Start Time  1115    OT Stop Time  1153    OT Time Calculation (min)  38 min    Activity Tolerance  Patient tolerated treatment well    Behavior During Therapy  WFL for tasks assessed/performed       Past Medical History:  Diagnosis Date  . Asthma   . Chronic abdominal pain   . Chronic back pain   . Chronic constipation   . Cirrhosis ([place])    Metavir score F4 on elastography 2015  . Cyclical vomiting   . Fibroids   . Fibromyalgia   . GERD (gastroesophageal reflux disease)   . H. pylori infection 2014   treated with pylera, had to be treated again as it was not eradicated. Urea breath test then negative after subsequent treatment.   . [name]    HCV RNA positive 09/2012  . Hypertension   . Marijuana use   . Nausea and vomiting    chronic, recurrent  . PONV (postoperative nausea and vomiting)    pt thinks maybe once she had N&V  . Sciatica of left side     Past Surgical History:  Procedure Laterality Date  . BALLOON DILATION  12/20/2017   Procedure: BALLOON DILATION;  Surgeon: Danie Binder, MD;  [facility];  Service: Endoscopy;;  pyloric dilation  . BIOPSY  12/20/2017   Procedure: BIOPSY;  Surgeon: Danie Binder, MD;  [facility];  Service: Endoscopy;;  duodenal and gastric  biopsy  . COLONOSCOPY  01/18/2008   [id number] rectum.  Long redundant colon, a diminutive sigmoid polyp status post cold biopsy removed. Hyperplastic polyp. Repeat colonoscopy June 2014 due to family history of colon cancer  . COLONOSCOPY WITH ESOPHAGOGASTRODUODENOSCOPY (EGD) N/A 11/02/2012   [id number] gastric mucosa of doubtful, +H.pylori. Incomplete colonoscopy due to patient unable to tolerate exam, proximal colon seen. Patient refused ACBE.  Marland Kitchen COLONOSCOPY WITH PROPOFOL N/A 03/08/2013   Dr. Gala Romney: colonic polyp-removed as scribed above. Internal Hemorrhoids. Pathology did not reveal any colonic tissue, only mucus. SURVEILLANCE DUE Aug 2019  . COLONOSCOPY WITH PROPOFOL N/A 01/19/2018   Dr. Gala Romney: 6 mm cecal inflammatoy polyp, otherwise normal., Surveilance in 5 year s  . ESOPHAGEAL DILATION  12/20/2017   Procedure: ESOPHAGEAL DILATION;  Surgeon: Danie Binder, MD;  [facility];  Service: Endoscopy;;  . ESOPHAGOGASTRODUODENOSCOPY  01/18/2008   RMR: Normal esophagus, normal  stomach  . ESOPHAGOGASTRODUODENOSCOPY (EGD) WITH PROPOFOL N/A 02/09/2016   Normal esophagus, small hiatal hernia, portal hypertensive gastropathy, normal second portion of duodenum. Due in July 2019  . ESOPHAGOGASTRODUODENOSCOPY (EGD) WITH PROPOFOL N/A 12/20/2017   Dr. Oneida Alar: benign appearing esophageal stenosis s/p dilation, mild pyloric stenosis s/p  biopsy and dilation, mild duodenitis.   Marland Kitchen HYSTEROSCOPY  05/11/2016   Procedure: HYSTEROSCOPY;  Surgeon: Jonnie Kind, MD;  [facility];  Service: Gynecology;;  . KNEE SURGERY     left knee  . MULTIPLE EXTRACTIONS WITH ALVEOLOPLASTY N/A 10/15/2013   Procedure: MULTIPLE EXTRACTION WITH ALVEOLOPLASTY;  Surgeon: Gae Bon, DDS;  Location: [facility];  Service: Oral Surgery;  Laterality: N/A;  . POLYPECTOMY N/A 03/08/2013   Procedure: POLYPECTOMY;  Surgeon: Daneil Dolin, MD;  [facility];  Service: Endoscopy;  Laterality: N/A;  . POLYPECTOMY N/A 05/11/2016    Procedure: REMOVAL OF ENDOMETRIAL POLYP;  Surgeon: Jonnie Kind, MD;  [facility];  Service: Gynecology;  Laterality: N/A;  . RESECTION DISTAL CLAVICAL Right 01/30/2019   Procedure: RESECTION DISTAL CLAVICAL;  Surgeon: Carole Civil, MD;  [facility];  Service: Orthopedics;  Laterality: Right;  . SHOULDER OPEN ROTATOR CUFF REPAIR Right 01/30/2019   Procedure: ROTATOR CUFF REPAIR SHOULDER OPEN;  Surgeon: Carole Civil, MD;  [facility];  Service: Orthopedics;  Laterality: Right;  pt to arrive at 7:30 for PICC at 8:00  . TOTAL KNEE ARTHROPLASTY Left 02/05/2015   Procedure: LEFT TOTAL KNEE ARTHROPLASTY;  Surgeon: Carole Civil, MD;  [facility];  Service: Orthopedics;  Laterality: Left;    There were no vitals filed for this visit.  Subjective Assessment - 05/28/19 1129    Subjective   S: I think I have a couple appointments left and then I'm done.    Currently in Pain?  No/denies         [facility] OT Assessment - 05/28/19 1130      Assessment   Medical Diagnosis  s/p Right Open RCR      Precautions   Precautions  Shoulder    Type of Shoulder Precautions  progress as tolerated                OT Treatments/Exercises (OP) - 05/28/19 1130      Exercises   Exercises  Shoulder      Shoulder Exercises: Supine   Protraction  PROM;5 reps    Horizontal ABduction  PROM;5 reps    External Rotation  PROM;5 reps    Internal Rotation  PROM;5 reps    Flexion  PROM;5 reps    ABduction  PROM;12 reps      Shoulder Exercises: Sidelying   External Rotation  Strengthening;12 reps    External Rotation Weight (lbs)  1    Internal Rotation  Strengthening;12 reps    Internal Rotation Weight (lbs)  1    Flexion  Strengthening;12 reps    Flexion Weight (lbs)  1    ABduction  Strengthening;12 reps    ABduction Weight (lbs)  1    Other Sidelying Exercises  protraction, 12X 1#    Other Sidelying Exercises  horizontal abduction 12X, 1#      Shoulder  Exercises: Standing   Extension  Theraband;10 reps    Theraband Level (Shoulder Extension)  Level 2 (Red)    Row  Theraband;10 reps    Theraband Level (Shoulder Row)  Level 2 (Red)    Retraction  Theraband;10 reps    Theraband Level (Shoulder Retraction)  Level 2 (Red)      Shoulder Exercises: ROM/Strengthening   Over Head Lace  2' seated    X to V Arms  12X with 1#    Proximal Shoulder Strengthening, Seated  12X with 1# no rest breaks  Ball on Wall  1' flexion 1' abduction green ball      Manual Therapy   Manual Therapy  Myofascial release    Manual therapy comments  completed separately from therapeutic exercises    Myofascial Release  myofascial release and manual therapy complete to right upper arm, trapezius, and scapular regions to decrease fascial restrictions and improve pain and ROM               OT Short Term Goals - 05/14/19 1720      OT SHORT TERM GOAL #1   Title  Patient will be educated and independent with HEP to faciliate progress in therapy and allow her to return to using her RUE as her dominant extremity.     Time  4    Period  Weeks    Status  On-going    Target Date  04/15/19      OT SHORT TERM GOAL #2   Title  Patient will increase RUE P/ROM to WNL in order to increase ability to complete dressing tasks with less difficulty.     Time  4    Period  Weeks      OT SHORT TERM GOAL #3   Title  Patient will report a decrease in pain level of 5/10 to improve ability to sleep in the bed vs the couch.    Time  4    Period  Weeks        OT Long Term Goals - 05/14/19 1720      OT LONG TERM GOAL #1   Title  Patient will increase her RUE A/ROM to WNL to improve ability to reach into overhead cabinets.    Time  8    Period  Weeks      OT LONG TERM GOAL #2   Title  Patient will increase her RUE strength to 4+/5 to improve ability to perform gardening tasks using RUE as dominant.    Time  8    Period  Weeks    Status  On-going      OT LONG TERM  GOAL #3   Title  Patient will decrease her fascial restrictions to trace amount or less in order to increase mobility required to complete functional reaching tasks.    Baseline  goal was for min restrictions, which she has met on 05/10/19 - updated goal for trace restrictions for improved pain control.    Time  8    Period  Weeks    Status  On-going      OT LONG TERM GOAL #4   Title  Patient will report a decrease in pain of approximately 3/10 or less in her RUE while completing household tasks with her right arm.    Time  8    Period  Weeks      OT LONG TERM GOAL #5   Title  Pt will return to highest level of functioning and independence using RUE as dominant.    Time  8    Period  Weeks    Status  On-going            Plan - 05/28/19 1146    Clinical Impression Statement  A: Completed sidelying strengthening and continued to focus on shoulder and scapular strengthening. Pt demonstrates decreased endurance and strength while taking rest breaks as needed during exercises. Minimal fascial restrictions noted with full passive ROM. VC for form and technique were provided throughout session.    Body Structure /  Function / Physical Skills  ADL;UE functional use;Fascial restriction;Pain;ROM;IADL;Strength    Plan  P: Continue with strengthening and shoulder stability to help decrease difficulty with daily tasks.    Consulted and Agree with Plan of Care  Patient       Patient will benefit from skilled therapeutic intervention in order to improve the following deficits and impairments:   Body Structure / Function / Physical Skills: ADL, UE functional use, Fascial restriction, Pain, ROM, IADL, Strength       Visit Diagnosis: Acute pain of right shoulder  Stiffness of right shoulder, not elsewhere classified  Other symptoms and signs involving the musculoskeletal system    Problem List Patient Active Problem List   Diagnosis Date Noted  . S/P right rotator cuff repair 01/30/19  02/06/2019  . Nontraumatic complete tear of right rotator cuff   . Arthritis of right acromioclavicular joint   . S/P total knee replacement, 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/24/2018  . Shoulder impingement, right 05/24/2018  . Dehydration   . Intractable cyclical vomiting 41/63/8453  . Pyloric stenosis in adult   . Thrombocytopenia ([place]) 12/20/2017  . Leukocytosis   . Malnutrition of moderate degree 12/17/2017  . Acute renal failure (ARF) ([place]) 12/15/2017  . Chronic abdominal pain 12/15/2017  . Gastroenteritis 06/14/2016  . Nausea with vomiting 06/10/2016  . Uterine enlargement 06/09/2016  . Intractable nausea and vomiting 06/08/2016  . Acute infective gastroenteritis 06/08/2016  . Diarrhea 06/08/2016  . Essential hypertension 06/08/2016  . GERD (gastroesophageal reflux disease) 06/08/2016  . Abdominal pain   . Endometrial polyp 04/15/2016  . PMB (postmenopausal bleeding) 02/27/2016  . Alcoholic cirrhosis of liver without ascites ([place])   . Asthma 01/21/2016  . Hepatic cirrhosis ([place]) 09/22/2015  . Arthritis of knee, degenerative 02/05/2015  . History of Helicobacter pylori infection 10/30/2014  . Chronic [name] with cirrhosis ([place]) 04/11/2014  . De Quervain's disease (radial styloid tenosynovitis) 12/25/2013  . Anorexia 11/21/2012  . FH: colon cancer 11/21/2012  . Early satiety 10/25/2012  . Bowel habit changes 10/25/2012  . Abdominal pain, epigastric 10/25/2012  . Abdominal bloating 10/25/2012  . Constipation 10/25/2012  . Abnormal weight loss 10/25/2012  . Chronic viral [name] ([place]) 10/25/2012  . Radicular pain of left lower extremity 09/28/2012  . Back pain 09/28/2012  . Sciatica 08/10/2011  . S/P arthroscopy of left knee 08/10/2011  . Tibial plateau fracture 08/10/2011  . Pain in joint, lower leg 02/12/2011  . Stiffness of joint, not elsewhere classified, lower leg 02/12/2011  . 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 Meniscus, medial, derangement 12/29/2010  . CLOSED  FRACTURE OF UPPER END OF TIBIA 08/12/2010   Ailene Ravel, OTR/L,CBIS  [phone]  05/28/2019, 12:32 PM  [facility] [address] [place], [place], [address] Phone: [phone]   Fax:  [phone]  Name: Colleen Lee MRN: [id number] Date of Birth: [birth year]

## 2019-05-29 ENCOUNTER — Other Ambulatory Visit: Payer: Self-pay | Admitting: Orthopedic Surgery

## 2019-05-29 ENCOUNTER — Other Ambulatory Visit: Payer: Self-pay

## 2019-05-29 DIAGNOSIS — G8918 Other acute postprocedural pain: Secondary | ICD-10-CM

## 2019-05-29 MED ORDER — HYDROCODONE-ACETAMINOPHEN 5-325 MG PO TABS: 1.0000 | ORAL_TABLET | Freq: Four times a day (QID) | ORAL | 0 refills | Status: DC | PRN

## 2019-05-29 NOTE — Telephone Encounter (Signed)
Patient requests refill on Hydrocodone/Acetaminophen 5-325  Mgs.  Qty  28 ° °Sig: Take 1 tablet by mouth every 6 (six) hours as needed for up to 7 days for moderate pain. ° °Patient states she uses [facility] °

## 2019-05-29 NOTE — Telephone Encounter (Signed)
ok 

## 2019-05-29 NOTE — Telephone Encounter (Signed)
Patient called to say [facility] will not have Hydrocodone until the first of the month. She is asking for her medicine be sent to [facility]

## 2019-05-30 ENCOUNTER — Ambulatory Visit (HOSPITAL_COMMUNITY): Payer: Medicare Other | Admitting: Occupational Therapy

## 2019-05-30 ENCOUNTER — Encounter (HOSPITAL_COMMUNITY): Payer: Self-pay | Admitting: Occupational Therapy

## 2019-05-30 ENCOUNTER — Other Ambulatory Visit: Payer: Self-pay

## 2019-05-30 DIAGNOSIS — M25511 Pain in right shoulder: Secondary | ICD-10-CM

## 2019-05-30 DIAGNOSIS — R29898 Other symptoms and signs involving the musculoskeletal system: Secondary | ICD-10-CM | POA: Diagnosis not present

## 2019-05-30 DIAGNOSIS — M25611 Stiffness of right shoulder, not elsewhere classified: Secondary | ICD-10-CM | POA: Diagnosis not present

## 2019-05-30 NOTE — Therapy (Signed)
[place], [place], [address] Phone: [phone]   Fax:  [phone]  Occupational Therapy Treatment  Patient Details  Name: Colleen Lee MRN: [id number] Date of Birth: 22-Mar-1956 Referring Provider (OT): Dr. Arther Abbott   Encounter Date: 05/30/2019  OT End of Session - 05/30/19 1027    Visit Number  22    Number of Visits  24    Date for OT Re-Evaluation  06/07/19    Authorization Type  1) [employer] Medicare 2) Medicaid    Authorization Time Period  visits based on medical necessity. progress note on visit 26.    Authorization - Visit Number  22    Authorization - Number of Visits  26    OT Start Time  [phone]    OT Stop Time  1026    OT Time Calculation (min)  38 min    Activity Tolerance  Patient tolerated treatment well    Behavior During Therapy  WFL for tasks assessed/performed       Past Medical History:  Diagnosis Date  . Asthma   . Chronic abdominal pain   . Chronic back pain   . Chronic constipation   . Cirrhosis ([place])    Metavir score F4 on elastography 2015  . Cyclical vomiting   . Fibroids   . Fibromyalgia   . GERD (gastroesophageal reflux disease)   . H. pylori infection 2014   treated with pylera, had to be treated again as it was not eradicated. Urea breath test then negative after subsequent treatment.   . [name]    HCV RNA positive 09/2012  . Hypertension   . Marijuana use   . Nausea and vomiting    chronic, recurrent  . PONV (postoperative nausea and vomiting)    pt thinks maybe once she had N&V  . Sciatica of left side     Past Surgical History:  Procedure Laterality Date  . BALLOON DILATION  12/20/2017   Procedure: BALLOON DILATION;  Surgeon: Danie Binder, MD;  [facility];  Service: Endoscopy;;  pyloric dilation  . BIOPSY  12/20/2017   Procedure: BIOPSY;  Surgeon: Danie Binder, MD;  [facility];  Service: Endoscopy;;  duodenal and gastric  biopsy  . COLONOSCOPY  01/18/2008   MNO:TRRNHA rectum.  Long redundant colon, a diminutive sigmoid polyp status post cold biopsy removed. Hyperplastic polyp. Repeat colonoscopy June 2014 due to family history of colon cancer  . COLONOSCOPY WITH ESOPHAGOGASTRODUODENOSCOPY (EGD) N/A 11/02/2012   FBX:UXYBFXOV gastric mucosa of doubtful, +H.pylori. Incomplete colonoscopy due to patient unable to tolerate exam, proximal colon seen. Patient refused ACBE.  Marland Kitchen COLONOSCOPY WITH PROPOFOL N/A 03/08/2013   Dr. Gala Romney: colonic polyp-removed as scribed above. Internal Hemorrhoids. Pathology did not reveal any colonic tissue, only mucus. SURVEILLANCE DUE Aug 2019  . COLONOSCOPY WITH PROPOFOL N/A 01/19/2018   Dr. Gala Romney: 6 mm cecal inflammatoy polyp, otherwise normal., Surveilance in 5 year s  . ESOPHAGEAL DILATION  12/20/2017   Procedure: ESOPHAGEAL DILATION;  Surgeon: Danie Binder, MD;  [facility];  Service: Endoscopy;;  . ESOPHAGOGASTRODUODENOSCOPY  01/18/2008   RMR: Normal esophagus, normal  stomach  . ESOPHAGOGASTRODUODENOSCOPY (EGD) WITH PROPOFOL N/A 02/09/2016   Normal esophagus, small hiatal hernia, portal hypertensive gastropathy, normal second portion of duodenum. Due in July 2019  . ESOPHAGOGASTRODUODENOSCOPY (EGD) WITH PROPOFOL N/A 12/20/2017   Dr. Oneida Alar: benign appearing esophageal stenosis s/p dilation, mild pyloric stenosis s/p  biopsy and dilation, mild duodenitis.   Marland Kitchen HYSTEROSCOPY  05/11/2016   Procedure: HYSTEROSCOPY;  Surgeon: Jonnie Kind, MD;  Location: [facility];  Service: Gynecology;;  . KNEE SURGERY     left knee  . MULTIPLE EXTRACTIONS WITH ALVEOLOPLASTY N/A 10/15/2013   Procedure: MULTIPLE EXTRACTION WITH ALVEOLOPLASTY;  Surgeon: Gae Bon, DDS;  Location: [facility];  Service: Oral Surgery;  Laterality: N/A;  . POLYPECTOMY N/A 03/08/2013   Procedure: POLYPECTOMY;  Surgeon: Daneil Dolin, MD;  Location[facility];  Service: Endoscopy;  Laterality: N/A;  . POLYPECTOMY N/A 05/11/2016    Procedure: REMOVAL OF ENDOMETRIAL POLYP;  Surgeon: Jonnie Kind, MD;  Location: [facility];  Service: Gynecology;  Laterality: N/A;  . RESECTION DISTAL CLAVICAL Right 01/30/2019   Procedure: RESECTION DISTAL CLAVICAL;  Surgeon: Carole Civil, MD;  Location: [facility];  Service: Orthopedics;  Laterality: Right;  . SHOULDER OPEN ROTATOR CUFF REPAIR Right 01/30/2019   Procedure: ROTATOR CUFF REPAIR SHOULDER OPEN;  Surgeon: Carole Civil, MD;  Location: [facility];  Service: Orthopedics;  Laterality: Right;  pt to arrive at 7:30 for PICC at 8:00  . TOTAL KNEE ARTHROPLASTY Left 02/05/2015   Procedure: LEFT TOTAL KNEE ARTHROPLASTY;  Surgeon: Carole Civil, MD;  Location: [facility];  Service: Orthopedics;  Laterality: Left;    There were no vitals filed for this visit.  Subjective Assessment - 05/30/19 0948    Subjective   S: It's feeling a whole lot better today.    Currently in Pain?  No/denies         [facility] OT Assessment - 05/30/19 0948      Assessment   Medical Diagnosis  s/p Right Open RCR      Precautions   Precautions  Shoulder    Type of Shoulder Precautions  progress as tolerated                OT Treatments/Exercises (OP) - 05/30/19 0949      Exercises   Exercises  Shoulder      Shoulder Exercises: Supine   Protraction  PROM;5 reps    Horizontal ABduction  PROM;5 reps    External Rotation  PROM;5 reps    Internal Rotation  PROM;5 reps    Flexion  PROM;5 reps    ABduction  PROM;12 reps      Shoulder Exercises: Sidelying   External Rotation  Strengthening;12 reps    External Rotation Weight (lbs)  1    Internal Rotation  Strengthening;12 reps    Internal Rotation Weight (lbs)  1    Flexion  Strengthening;12 reps    Flexion Weight (lbs)  1    ABduction  Strengthening;12 reps    ABduction Weight (lbs)  1    Other Sidelying Exercises  protraction, 12X 1#    Other Sidelying Exercises  horizontal abduction 12X, 1#      Shoulder Exercises: Standing    Horizontal ABduction  Theraband;12 reps    Theraband Level (Shoulder Horizontal ABduction)  Level 2 (Red)    External Rotation  Theraband;12 reps    Theraband Level (Shoulder External Rotation)  Level 2 (Red)    Extension  Theraband;12 reps    Theraband Level (Shoulder Extension)  Level 2 (Red)    Row  Theraband;12 reps    Theraband Level (Shoulder Row)  Level 2 (Red)    Retraction  Theraband;12 reps    Theraband Level (Shoulder Retraction)  Level 2 (Red)      Shoulder  Exercises: Therapy Ball   Other Therapy Ball Exercises  small green therapy ball: chest press, overhead press, flexion, circles each direction, 12X each      Shoulder Exercises: ROM/Strengthening   Over Head Lace  2' seated    X to V Arms  12X with 1#    Proximal Shoulder Strengthening, Seated  12X with 1# no rest breaks    Ball on Wall  1' flexion 1' abduction green ball    Other ROM/Strengthening Exercises  ball pass behind back working on IR, 10X using green weighted ball.       Manual Therapy   Manual Therapy  Myofascial release    Manual therapy comments  completed separately from therapeutic exercises    Myofascial Release  myofascial release and manual therapy complete to right upper arm, trapezius, and scapular regions to decrease fascial restrictions and improve pain and ROM               OT Short Term Goals - 05/14/19 1720      OT SHORT TERM GOAL #1   Title  Patient will be educated and independent with HEP to faciliate progress in therapy and allow her to return to using her RUE as her dominant extremity.     Time  4    Period  Weeks    Status  On-going    Target Date  04/15/19      OT SHORT TERM GOAL #2   Title  Patient will increase RUE P/ROM to WNL in order to increase ability to complete dressing tasks with less difficulty.     Time  4    Period  Weeks      OT SHORT TERM GOAL #3   Title  Patient will report a decrease in pain level of 5/10 to improve ability to sleep in the bed vs the  couch.    Time  4    Period  Weeks        OT Long Term Goals - 05/14/19 1720      OT LONG TERM GOAL #1   Title  Patient will increase her RUE A/ROM to WNL to improve ability to reach into overhead cabinets.    Time  8    Period  Weeks      OT LONG TERM GOAL #2   Title  Patient will increase her RUE strength to 4+/5 to improve ability to perform gardening tasks using RUE as dominant.    Time  8    Period  Weeks    Status  On-going      OT LONG TERM GOAL #3   Title  Patient will decrease her fascial restrictions to trace amount or less in order to increase mobility required to complete functional reaching tasks.    Baseline  goal was for min restrictions, which she has met on 05/10/19 - updated goal for trace restrictions for improved pain control.    Time  8    Period  Weeks    Status  On-going      OT LONG TERM GOAL #4   Title  Patient will report a decrease in pain of approximately 3/10 or less in her RUE while completing household tasks with her right arm.    Time  8    Period  Weeks      OT LONG TERM GOAL #5   Title  Pt will return to highest level of functioning and independence using RUE as dominant.  Time  8    Period  Weeks    Status  On-going            Plan - 05/30/19 4403    Clinical Impression Statement  A: Minimal fascial restrictions this date in anterior shoulder and trapezius regions. Continued with strengthening tasks in sidelying and standing. Added red theraband er and horizontal abduction today. Pt requiring consistent verbal and visual cuing for form and technique during exercises.    Body Structure / Function / Physical Skills  ADL;UE functional use;Fascial restriction;Pain;ROM;IADL;Strength    Plan  P: manual techniques only as needed, continue with strengthening tasks in preparation for discharge       Patient will benefit from skilled therapeutic intervention in order to improve the following deficits and impairments:   Body Structure /  Function / Physical Skills: ADL, UE functional use, Fascial restriction, Pain, ROM, IADL, Strength       Visit Diagnosis: Acute pain of right shoulder  Stiffness of right shoulder, not elsewhere classified  Other symptoms and signs involving the musculoskeletal system    Problem List Patient Active Problem List   Diagnosis Date Noted  . S/P right rotator cuff repair 01/30/19 02/06/2019  . Nontraumatic complete tear of right rotator cuff   . Arthritis of right acromioclavicular joint   . S/P total knee replacement, 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/24/2018  . Shoulder impingement, right 05/24/2018  . Dehydration   . Intractable cyclical vomiting 47/42/5956  . Pyloric stenosis in adult   . Thrombocytopenia ([place]) 12/20/2017  . Leukocytosis   . Malnutrition of moderate degree 12/17/2017  . Acute renal failure (ARF) ([place]) 12/15/2017  . Chronic abdominal pain 12/15/2017  . Gastroenteritis 06/14/2016  . Nausea with vomiting 06/10/2016  . Uterine enlargement 06/09/2016  . Intractable nausea and vomiting 06/08/2016  . Acute infective gastroenteritis 06/08/2016  . Diarrhea 06/08/2016  . Essential hypertension 06/08/2016  . GERD (gastroesophageal reflux disease) 06/08/2016  . Abdominal pain   . Endometrial polyp 04/15/2016  . PMB (postmenopausal bleeding) 02/27/2016  . Alcoholic cirrhosis of liver without ascites ([place])   . Asthma 01/21/2016  . Hepatic cirrhosis ([place]) 09/22/2015  . Arthritis of knee, degenerative 02/05/2015  . History of Helicobacter pylori infection 10/30/2014  . Chronic [name] with cirrhosis ([place]) 04/11/2014  . De Quervain's disease (radial styloid tenosynovitis) 12/25/2013  . Anorexia 11/21/2012  . FH: colon cancer 11/21/2012  . Early satiety 10/25/2012  . Bowel habit changes 10/25/2012  . Abdominal pain, epigastric 10/25/2012  . Abdominal bloating 10/25/2012  . Constipation 10/25/2012  . Abnormal weight loss 10/25/2012  . Chronic viral [name] ([place]) 10/25/2012   . Radicular pain of left lower extremity 09/28/2012  . Back pain 09/28/2012  . Sciatica 08/10/2011  . S/P arthroscopy of left knee 08/10/2011  . Tibial plateau fracture 08/10/2011  . Pain in joint, lower leg 02/12/2011  . Stiffness of joint, not elsewhere classified, lower leg 02/12/2011  . 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 Meniscus, medial, derangement 12/29/2010  . CLOSED FRACTURE OF UPPER END OF TIBIA 08/12/2010   Guadelupe Sabin, OTR/L  [phone] 05/30/2019, 10:29 AM  [place] [address] [place], [place], [address] Phone: [phone]   Fax:  [phone]  Name: CHAUNTELLE AZPEITIA MRN: [id number] Date of Birth: 04-11-1956

## 2019-06-04 ENCOUNTER — Telehealth (HOSPITAL_COMMUNITY): Payer: Self-pay

## 2019-06-04 ENCOUNTER — Encounter (HOSPITAL_COMMUNITY): Payer: Medicare Other

## 2019-06-04 NOTE — Telephone Encounter (Signed)
pt called to cancel today's appt due to she does not feel well

## 2019-06-05 ENCOUNTER — Other Ambulatory Visit: Payer: Self-pay | Admitting: Orthopedic Surgery

## 2019-06-05 DIAGNOSIS — G8918 Other acute postprocedural pain: Secondary | ICD-10-CM

## 2019-06-05 MED ORDER — HYDROCODONE-ACETAMINOPHEN 5-325 MG PO TABS: 1.0000 | ORAL_TABLET | Freq: Four times a day (QID) | ORAL | 0 refills | Status: DC | PRN

## 2019-06-05 NOTE — Telephone Encounter (Signed)
Patient requests refill on Hydrocodone/Acetaminophen 5-325  Mgs.  Qty  28  Sig: Take 1 tablet by mouth every 6 (six) hours as needed for up to 7 days for moderate pain.  [facility]

## 2019-06-06 ENCOUNTER — Other Ambulatory Visit: Payer: Self-pay

## 2019-06-06 ENCOUNTER — Encounter (HOSPITAL_COMMUNITY): Payer: Self-pay | Admitting: Occupational Therapy

## 2019-06-06 ENCOUNTER — Ambulatory Visit (HOSPITAL_COMMUNITY): Payer: Medicare Other | Attending: Orthopedic Surgery | Admitting: Occupational Therapy

## 2019-06-06 DIAGNOSIS — M25511 Pain in right shoulder: Secondary | ICD-10-CM

## 2019-06-06 DIAGNOSIS — R29898 Other symptoms and signs involving the musculoskeletal system: Secondary | ICD-10-CM | POA: Diagnosis not present

## 2019-06-06 NOTE — Therapy (Signed)
[place], [place], [address] Phone: [phone]   Fax:  [phone]  Occupational Therapy Reassessment, Treatment, Discharge Summary  Patient Details  Name: Colleen Lee MRN: [id number] Date of Birth: 05-15-1956 Referring Provider (OT): Dr. Arther Abbott  Progress Note Reporting Period 05/14/2019 to 06/06/2019  See note below for Objective Data and Assessment of Progress/Goals.        Encounter Date: 06/06/2019  OT End of Session - 06/06/19 1020    Visit Number  23    Number of Visits  24    Date for OT Re-Evaluation  06/07/19    Authorization Type  1) [employer] Medicare 2) Medicaid    Authorization Time Period  visits based on medical necessity. progress note on visit 26.    Authorization - Visit Number  23    Authorization - Number of Visits  26    OT Start Time  [id number]    OT Stop Time  1019    OT Time Calculation (min)  33 min    Activity Tolerance  Patient tolerated treatment well    Behavior During Therapy  WFL for tasks assessed/performed       Past Medical History:  Diagnosis Date  . Asthma   . Chronic abdominal pain   . Chronic back pain   . Chronic constipation   . Cirrhosis ([place])    Metavir score F4 on elastography 2015  . Cyclical vomiting   . Fibroids   . Fibromyalgia   . GERD (gastroesophageal reflux disease)   . H. pylori infection 2014   treated with pylera, had to be treated again as it was not eradicated. Urea breath test then negative after subsequent treatment.   . [name]    HCV RNA positive 09/2012  . Hypertension   . Marijuana use   . Nausea and vomiting    chronic, recurrent  . PONV (postoperative nausea and vomiting)    pt thinks maybe once she had N&V  . Sciatica of left side     Past Surgical History:  Procedure Laterality Date  . BALLOON DILATION  12/20/2017   Procedure: BALLOON DILATION;  Surgeon: Danie Binder, MD;  [facility];  Service:  Endoscopy;;  pyloric dilation  . BIOPSY  12/20/2017   Procedure: BIOPSY;  Surgeon: Danie Binder, MD;  [facility];  Service: Endoscopy;;  duodenal and gastric biopsy  . COLONOSCOPY  01/18/2008   BCW:[id number] rectum.  Long redundant colon, a diminutive sigmoid polyp status post cold biopsy removed. Hyperplastic polyp. Repeat colonoscopy June 2014 due to family history of colon cancer  . COLONOSCOPY WITH ESOPHAGOGASTRODUODENOSCOPY (EGD) N/A 11/02/2012   XIH:[id number] gastric mucosa of doubtful, +H.pylori. Incomplete colonoscopy due to patient unable to tolerate exam, proximal colon seen. Patient refused ACBE.  Marland Kitchen COLONOSCOPY WITH PROPOFOL N/A 03/08/2013   Dr. Gala Romney: colonic polyp-removed as scribed above. Internal Hemorrhoids. Pathology did not reveal any colonic tissue, only mucus. SURVEILLANCE DUE Aug 2019  . COLONOSCOPY WITH PROPOFOL N/A 01/19/2018   Dr. Gala Romney: 6 mm cecal inflammatoy polyp, otherwise normal., Surveilance in 5 year s  . ESOPHAGEAL DILATION  12/20/2017   Procedure: ESOPHAGEAL DILATION;  Surgeon: Danie Binder, MD;  [facility];  Service: Endoscopy;;  . ESOPHAGOGASTRODUODENOSCOPY  01/18/2008   RMR: Normal esophagus, normal  stomach  . ESOPHAGOGASTRODUODENOSCOPY (EGD) WITH PROPOFOL N/A 02/09/2016   Normal esophagus, small hiatal hernia, portal hypertensive gastropathy, normal second portion of  duodenum. Due in July 2019  . ESOPHAGOGASTRODUODENOSCOPY (EGD) WITH PROPOFOL N/A 12/20/2017   Dr. Oneida Alar: benign appearing esophageal stenosis s/p dilation, mild pyloric stenosis s/p biopsy and dilation, mild duodenitis.   Marland Kitchen HYSTEROSCOPY  05/11/2016   Procedure: HYSTEROSCOPY;  Surgeon: Jonnie Kind, MD;  Location: [facility];  Service: Gynecology;;  . KNEE SURGERY     left knee  . MULTIPLE EXTRACTIONS WITH ALVEOLOPLASTY N/A 10/15/2013   Procedure: MULTIPLE EXTRACTION WITH ALVEOLOPLASTY;  Surgeon: Gae Bon, DDS;  Location: [facility];  Service: Oral Surgery;  Laterality: N/A;   . POLYPECTOMY N/A 03/08/2013   Procedure: POLYPECTOMY;  Surgeon: Daneil Dolin, MD;  Location: [facility];  Service: Endoscopy;  Laterality: N/A;  . POLYPECTOMY N/A 05/11/2016   Procedure: REMOVAL OF ENDOMETRIAL POLYP;  Surgeon: Jonnie Kind, MD;  Location: [facility];  Service: Gynecology;  Laterality: N/A;  . RESECTION DISTAL CLAVICAL Right 01/30/2019   Procedure: RESECTION DISTAL CLAVICAL;  Surgeon: Carole Civil, MD;  Location: [facility];  Service: Orthopedics;  Laterality: Right;  . SHOULDER OPEN ROTATOR CUFF REPAIR Right 01/30/2019   Procedure: ROTATOR CUFF REPAIR SHOULDER OPEN;  Surgeon: Carole Civil, MD;  Location: [facility];  Service: Orthopedics;  Laterality: Right;  pt to arrive at 7:30 for PICC at 8:00  . TOTAL KNEE ARTHROPLASTY Left 02/05/2015   Procedure: LEFT TOTAL KNEE ARTHROPLASTY;  Surgeon: Carole Civil, MD;  Location: [facility];  Service: Orthopedics;  Laterality: Left;    There were no vitals filed for this visit.  Subjective Assessment - 06/06/19 0946    Subjective   S: It's my last day.    Currently in Pain?  No/denies         [facility] OT Assessment - 06/06/19 0946      Assessment   Medical Diagnosis  s/p Right Open RCR      Precautions   Precautions  Shoulder    Type of Shoulder Precautions  progress as tolerated       Observation/Other Assessments   Focus on Therapeutic Outcomes (FOTO)   57/100   60/100 previous     Palpation   Palpation comment  min fascial restrictions       AROM   Overall AROM Comments  Assessed seated, er/IR adducted    AROM Assessment Site  Shoulder    Right/Left Shoulder  Right    Right Shoulder Flexion  150 Degrees   same as previous   Right Shoulder ABduction  150 Degrees   same as previous   Right Shoulder Internal Rotation  90 Degrees   same as previous   Right Shoulder External Rotation  65 Degrees   same as previous     PROM   Overall PROM Comments  assessed in supine and is WNL    PROM Assessment Site  Shoulder     Right/Left Shoulder  Right    Right Shoulder Flexion  170 Degrees   154 previous   Right Shoulder ABduction  180 Degrees   same as previous   Right Shoulder Internal Rotation  90 Degrees   same as previous   Right Shoulder External Rotation  85 Degrees   same as previous     Strength   Overall Strength Comments  Assessed seated, er/IR adducted    Strength Assessment Site  Shoulder    Right/Left Shoulder  Right    Right Shoulder Flexion  4+/5   4/5 previous   Right Shoulder ABduction  4/5  same as previous   Right Shoulder Internal Rotation  4+/5   4/5 previous   Right Shoulder External Rotation  4+/5   same as previous              OT Treatments/Exercises (OP) - 06/06/19 0949      Exercises   Exercises  Shoulder      Shoulder Exercises: Supine   Protraction  PROM;5 reps;Strengthening;12 reps    Protraction Weight (lbs)  1    Horizontal ABduction  PROM;5 reps;Strengthening;12 reps    Horizontal ABduction Weight (lbs)  1    External Rotation  PROM;5 reps;Strengthening;12 reps    External Rotation Weight (lbs)  1    Internal Rotation  PROM;5 reps;Strengthening;12 reps    Internal Rotation Weight (lbs)  1    Flexion  PROM;5 reps;Strengthening;12 reps    Shoulder Flexion Weight (lbs)  1    ABduction  PROM;5 reps;Strengthening;12 reps    Shoulder ABduction Weight (lbs)  1      Shoulder Exercises: Seated   Protraction  Strengthening;12 reps    Protraction Weight (lbs)  1    Horizontal ABduction  Strengthening;12 reps    Horizontal ABduction Weight (lbs)  1    External Rotation  Strengthening;12 reps    External Rotation Weight (lbs)  1    Internal Rotation  Strengthening;12 reps    Internal Rotation Weight (lbs)  1    Flexion  Strengthening;12 reps    Flexion Weight (lbs)  1    Abduction  Strengthening;12 reps    ABduction Weight (lbs)  1    Other Seated Exercises  red theraband: horizontal abduction, er/IR, diagonals, 10X each                               OT Education - 06/06/19 1007    Education Details  red theraband strengthening, reviewed shoulder strengthening exercises    Person(s) Educated  Patient    Methods  Explanation    Comprehension  Verbalized understanding       OT Short Term Goals - 06/06/19 1008      OT SHORT TERM GOAL #1   Title  Patient will be educated and independent with HEP to faciliate progress in therapy and allow her to return to using her RUE as her dominant extremity.     Time  4    Period  Weeks    Status  Achieved    Target Date  04/15/19      OT SHORT TERM GOAL #2   Title  Patient will increase RUE P/ROM to WNL in order to increase ability to complete dressing tasks with less difficulty.     Time  4    Period  Weeks      OT SHORT TERM GOAL #3   Title  Patient will report a decrease in pain level of 5/10 to improve ability to sleep in the bed vs the couch.    Time  4    Period  Weeks        OT Long Term Goals - 06/06/19 1008      OT LONG TERM GOAL #1   Title  Patient will increase her RUE A/ROM to WNL to improve ability to reach into overhead cabinets.    Time  8    Period  Weeks      OT LONG TERM GOAL #2   Title  Patient will increase  her RUE strength to 4+/5 to improve ability to perform gardening tasks using RUE as dominant.    Time  8    Period  Weeks    Status  Partially Met      OT LONG TERM GOAL #3   Title  Patient will decrease her fascial restrictions to trace amount or less in order to increase mobility required to complete functional reaching tasks.    Baseline  goal was for min restrictions, which she has met on 05/10/19 - updated goal for trace restrictions for improved pain control.    Time  8    Period  Weeks    Status  Not Met      OT LONG TERM GOAL #4   Title  Patient will report a decrease in pain of approximately 3/10 or less in her RUE while completing household tasks with her right arm.    Time  8    Period  Weeks      OT LONG TERM GOAL #5    Title  Pt will return to highest level of functioning and independence using RUE as dominant.    Time  8    Period  Weeks    Status  Achieved            Plan - 06/06/19 0957    Clinical Impression Statement  A: Reassessment completed this session, pt has met all STGs, 3/5 LTGs with one additional LTG partially met. Pt reports she can now comb her hair and reach up into cabinets for objects or to put items away with minimal difficulty. Pt provided with updated HEP, performing with cuing for form. Pt is pleased with her current level of functioning and is agreeable to discharge.    Body Structure / Function / Physical Skills  ADL;UE functional use;Fascial restriction;Pain;ROM;IADL;Strength    Plan  P: Discharge pt       Patient will benefit from skilled therapeutic intervention in order to improve the following deficits and impairments:   Body Structure / Function / Physical Skills: ADL, UE functional use, Fascial restriction, Pain, ROM, IADL, Strength       Visit Diagnosis: Acute pain of right shoulder  Other symptoms and signs involving the musculoskeletal system    Problem List Patient Active Problem List   Diagnosis Date Noted  . S/P right rotator cuff repair 01/30/19 02/06/2019  . Nontraumatic complete tear of right rotator cuff   . Arthritis of right acromioclavicular joint   . S/P total knee replacement, 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/24/2018  . Shoulder impingement, right 05/24/2018  . Dehydration   . Intractable cyclical vomiting 50/27/7412  . Pyloric stenosis in adult   . Thrombocytopenia ([place]) 12/20/2017  . Leukocytosis   . Malnutrition of moderate degree 12/17/2017  . Acute renal failure (ARF) ([place]) 12/15/2017  . Chronic abdominal pain 12/15/2017  . Gastroenteritis 06/14/2016  . Nausea with vomiting 06/10/2016  . Uterine enlargement 06/09/2016  . Intractable nausea and vomiting 06/08/2016  . Acute infective gastroenteritis 06/08/2016  . Diarrhea 06/08/2016  . Essential  hypertension 06/08/2016  . GERD (gastroesophageal reflux disease) 06/08/2016  . Abdominal pain   . Endometrial polyp 04/15/2016  . PMB (postmenopausal bleeding) 02/27/2016  . Alcoholic cirrhosis of liver without ascites ([place])   . Asthma 01/21/2016  . Hepatic cirrhosis ([place]) 09/22/2015  . Arthritis of knee, degenerative 02/05/2015  . History of Helicobacter pylori infection 10/30/2014  . Chronic [name] with cirrhosis ([place]) 04/11/2014  . De Quervain's disease (radial styloid  tenosynovitis) 12/25/2013  . Anorexia 11/21/2012  . FH: colon cancer 11/21/2012  . Early satiety 10/25/2012  . Bowel habit changes 10/25/2012  . Abdominal pain, epigastric 10/25/2012  . Abdominal bloating 10/25/2012  . Constipation 10/25/2012  . Abnormal weight loss 10/25/2012  . Chronic viral [name] ([place]) 10/25/2012  . Radicular pain of left lower extremity 09/28/2012  . Back pain 09/28/2012  . Sciatica 08/10/2011  . S/P arthroscopy of left knee 08/10/2011  . Tibial plateau fracture 08/10/2011  . Pain in joint, lower leg 02/12/2011  . Stiffness of joint, not elsewhere classified, lower leg 02/12/2011  . 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 Meniscus, medial, derangement 12/29/2010  . CLOSED FRACTURE OF UPPER END OF TIBIA 08/12/2010   Guadelupe Sabin, OTR/L  [phone] 06/06/2019, 10:21 AM  [facility] [address] [place], [place], [address] Phone: [phone]   Fax:  [phone]  Name: Colleen Lee MRN: [id number] Date of Birth: March 23, 1956   OCCUPATIONAL THERAPY DISCHARGE SUMMARY  Visits from Start of Care: 23  Current functional level related to goals / functional outcomes: See above. Pt reports independence in ADLs, is now able to reach overhead for items in cabinets and can comb hair and wash back.    Remaining deficits: Decreased activity tolerance, decreased strength    Education / Equipment: HEP for continued RUE  strengthening Plan: Patient agrees to discharge.  Patient goals were met. Patient is being discharged due to meeting the stated rehab goals.  ?????

## 2019-06-06 NOTE — Patient Instructions (Signed)
Strengthening: Chest Pull - Resisted   Hold Theraband in front of body with hands about shoulder width a part. Pull band a part and back together slowly. Repeat _10___ times. Complete __1__ set(s) per session.. Repeat __1-2__ session(s) per day.  http://orth.exer.us/926   Copyright  VHI. All rights reserved.   PNF Strengthening: Resisted   Standing with resistive band around each hand, bring right arm up and away, thumb back. Repeat _10___ times per set. Do ___1_ sets per session. Do __1-2__ sessions per day.                           Resisted External Rotation: in Neutral - Bilateral   Sit or stand, tubing in both hands, elbows at sides, bent to 90, forearms forward. Pinch shoulder blades together and rotate forearms out. Keep elbows at sides. Repeat _10___ times per set. Do __1__ sets per session. Do __1-2__ sessions per day.  http://orth.exer.us/966   Copyright  VHI. All rights reserved.   PNF Strengthening: Resisted   Standing, hold resistive band above head. Bring right arm down and out from side. Repeat _10___ times per set. Do __1__ sets per session. Do __1-2__ sessions per day.  http://orth.exer.us/922   Copyright  VHI. All rights reserved.   

## 2019-06-12 ENCOUNTER — Other Ambulatory Visit: Payer: Self-pay | Admitting: Orthopedic Surgery

## 2019-06-12 DIAGNOSIS — G8918 Other acute postprocedural pain: Secondary | ICD-10-CM

## 2019-06-12 NOTE — Telephone Encounter (Signed)
Patient called for refill: HYDROcodone-acetaminophen (NORCO/VICODIN) 5-325 MG tablet / 28 tablets * *[facility] is preferred pharmacy (not [facility])  - patient aware of appointment which we re-scheduled due to Dr's schedule change

## 2019-06-13 ENCOUNTER — Ambulatory Visit: Payer: Medicare Other | Admitting: Orthopedic Surgery

## 2019-06-19 ENCOUNTER — Other Ambulatory Visit: Payer: Self-pay | Admitting: Orthopedic Surgery

## 2019-06-19 DIAGNOSIS — G8918 Other acute postprocedural pain: Secondary | ICD-10-CM

## 2019-06-19 NOTE — Telephone Encounter (Signed)
Hydrocodone refill requested. 

## 2019-06-19 NOTE — Telephone Encounter (Signed)
Wrong format   Send thru proper format

## 2019-06-19 NOTE — Telephone Encounter (Signed)
Patient requests refill on Hydrocodone/Acetaminophen 5-325  Mgs.  Qty  28  Sig: TAKE 1 TABLET BY MOUTH EVERY 6 HOURS AS NEEDED FOR UP TO 7 DAYS FOR MODERATE PAIN.  Patient states she uses [facility]

## 2019-06-20 ENCOUNTER — Encounter: Payer: Self-pay | Admitting: Orthopedic Surgery

## 2019-06-20 ENCOUNTER — Other Ambulatory Visit: Payer: Self-pay

## 2019-06-20 ENCOUNTER — Ambulatory Visit (INDEPENDENT_AMBULATORY_CARE_PROVIDER_SITE_OTHER): Payer: Medicare Other | Admitting: Orthopedic Surgery

## 2019-06-20 VITALS — BP 138/92 | HR 104 | Ht 66.0 in | Wt 190.0 lb

## 2019-06-20 DIAGNOSIS — M75121 Complete rotator cuff tear or rupture of right shoulder, not specified as traumatic: Secondary | ICD-10-CM | POA: Diagnosis not present

## 2019-06-20 DIAGNOSIS — Z9889 Other specified postprocedural states: Secondary | ICD-10-CM | POA: Diagnosis not present

## 2019-06-20 DIAGNOSIS — G8918 Other acute postprocedural pain: Secondary | ICD-10-CM | POA: Diagnosis not present

## 2019-06-20 MED ORDER — IBUPROFEN 800 MG PO TABS: 800.0000 mg | ORAL_TABLET | Freq: Three times a day (TID) | ORAL | 0 refills | Status: DC | PRN

## 2019-06-20 MED ORDER — HYDROCODONE-ACETAMINOPHEN 5-325 MG PO TABS: ORAL_TABLET | ORAL | 0 refills | Status: DC

## 2019-06-20 NOTE — Progress Notes (Signed)
Chief Complaint  Patient presents with  . Shoulder Pain    right s/p RCR 01/30/19 improving   . Medication Refill    ibuprofen and hydrocodone    63 year old female 6 months out from her rotator cuff repair she had a massive tear we did an open repair she did well her last visit she had 150 degrees of active flexion which she is maintained she complains of decreased extension and some popping and inability to place objects on the shelf past the first shelf in the cabinet  She would like her ibuprofen and hydrocodone refilled.  Note hydrocodone was taken chronically prior to surgery and we have weaned her down to 1 every 6 hours.  Recommend home exercises follow-up in 3 months  Encounter Diagnoses  Name Primary?  . S/P right rotator cuff repair 01/30/19 Yes  . Nontraumatic complete tear of right rotator cuff   . Post-operative pain    Meds ordered this encounter  Medications  . ibuprofen (ADVIL) 800 MG tablet    Sig: Take 1 tablet (800 mg total) by mouth every 8 (eight) hours as needed.    Dispense:  90 tablet    Refill:  0  . HYDROcodone-acetaminophen (NORCO/VICODIN) 5-325 MG tablet    Sig: TAKE 1 TABLET BY MOUTH EVERY 6 HOURS AS NEEDED FOR UP TO 7 DAYS FOR MODERATE PAIN.    Dispense:  28 tablet    Refill:  0

## 2019-06-20 NOTE — Patient Instructions (Signed)
Home exercises   Work on strength and range of motion

## 2019-06-25 DIAGNOSIS — J449 Chronic obstructive pulmonary disease, unspecified: Secondary | ICD-10-CM | POA: Diagnosis not present

## 2019-06-25 DIAGNOSIS — I10 Essential (primary) hypertension: Secondary | ICD-10-CM | POA: Diagnosis not present

## 2019-06-26 ENCOUNTER — Other Ambulatory Visit: Payer: Self-pay | Admitting: Orthopedic Surgery

## 2019-06-26 DIAGNOSIS — G8918 Other acute postprocedural pain: Secondary | ICD-10-CM

## 2019-06-26 MED ORDER — HYDROCODONE-ACETAMINOPHEN 5-325 MG PO TABS: ORAL_TABLET | ORAL | 0 refills | Status: DC

## 2019-06-26 NOTE — Telephone Encounter (Signed)
Patient requests refill: HYDROcodone-acetaminophen (NORCO/VICODIN) 5-325 MG tablet 28 tablet  -[facility]

## 2019-07-02 ENCOUNTER — Other Ambulatory Visit: Payer: Self-pay | Admitting: Internal Medicine

## 2019-07-02 ENCOUNTER — Other Ambulatory Visit (HOSPITAL_COMMUNITY): Payer: Self-pay | Admitting: Internal Medicine

## 2019-07-02 DIAGNOSIS — E785 Hyperlipidemia, unspecified: Secondary | ICD-10-CM | POA: Diagnosis not present

## 2019-07-02 DIAGNOSIS — J449 Chronic obstructive pulmonary disease, unspecified: Secondary | ICD-10-CM | POA: Diagnosis not present

## 2019-07-02 DIAGNOSIS — R55 Syncope and collapse: Secondary | ICD-10-CM | POA: Diagnosis not present

## 2019-07-02 DIAGNOSIS — K219 Gastro-esophageal reflux disease without esophagitis: Secondary | ICD-10-CM | POA: Diagnosis not present

## 2019-07-03 ENCOUNTER — Other Ambulatory Visit: Payer: Self-pay | Admitting: Orthopedic Surgery

## 2019-07-03 ENCOUNTER — Other Ambulatory Visit (HOSPITAL_COMMUNITY): Payer: Self-pay | Admitting: Internal Medicine

## 2019-07-03 DIAGNOSIS — G8918 Other acute postprocedural pain: Secondary | ICD-10-CM

## 2019-07-03 DIAGNOSIS — R55 Syncope and collapse: Secondary | ICD-10-CM

## 2019-07-03 MED ORDER — HYDROCODONE-ACETAMINOPHEN 5-325 MG PO TABS: ORAL_TABLET | ORAL | 0 refills | Status: DC

## 2019-07-03 NOTE — Telephone Encounter (Signed)
Patient requests refill: HYDROcodone-acetaminophen (NORCO/VICODIN) 5-325 MG tablet 28 tablet  -[facility]

## 2019-07-06 ENCOUNTER — Ambulatory Visit (HOSPITAL_COMMUNITY): Payer: Medicare Other

## 2019-07-06 ENCOUNTER — Ambulatory Visit (HOSPITAL_COMMUNITY)
Admission: RE | Admit: 2019-07-06 | Discharge: 2019-07-06 | Disposition: A | Discharge status: Home / Self Care | Payer: Medicare Other | Source: Ambulatory Visit | Attending: Internal Medicine | Admitting: Internal Medicine

## 2019-07-06 ENCOUNTER — Other Ambulatory Visit: Payer: Self-pay

## 2019-07-06 DIAGNOSIS — R55 Syncope and collapse: Secondary | ICD-10-CM | POA: Diagnosis not present

## 2019-07-06 NOTE — Progress Notes (Signed)
*  PRELIMINARY RESULTS* Echocardiogram 2D Echocardiogram has been performed.  Colleen Lee 07/06/2019, 1:38 PM

## 2019-07-09 ENCOUNTER — Ambulatory Visit (HOSPITAL_COMMUNITY)
Admission: RE | Admit: 2019-07-09 | Discharge: 2019-07-09 | Disposition: A | Discharge status: Home / Self Care | Payer: Medicare Other | Source: Ambulatory Visit | Attending: Internal Medicine | Admitting: Internal Medicine

## 2019-07-09 ENCOUNTER — Other Ambulatory Visit: Payer: Self-pay

## 2019-07-09 DIAGNOSIS — R55 Syncope and collapse: Secondary | ICD-10-CM | POA: Diagnosis not present

## 2019-07-09 DIAGNOSIS — I6523 Occlusion and stenosis of bilateral carotid arteries: Secondary | ICD-10-CM | POA: Diagnosis not present

## 2019-07-10 ENCOUNTER — Other Ambulatory Visit: Payer: Self-pay | Admitting: Radiology

## 2019-07-10 DIAGNOSIS — G8918 Other acute postprocedural pain: Secondary | ICD-10-CM

## 2019-07-10 MED ORDER — HYDROCODONE-ACETAMINOPHEN 5-325 MG PO TABS: ORAL_TABLET | ORAL | 0 refills | Status: DC

## 2019-07-10 NOTE — Telephone Encounter (Signed)
Rx refill request [facility]

## 2019-07-17 ENCOUNTER — Other Ambulatory Visit: Payer: Self-pay | Admitting: Orthopedic Surgery

## 2019-07-17 DIAGNOSIS — G8918 Other acute postprocedural pain: Secondary | ICD-10-CM

## 2019-07-17 MED ORDER — HYDROCODONE-ACETAMINOPHEN 5-325 MG PO TABS: ORAL_TABLET | ORAL | 0 refills | Status: DC

## 2019-07-17 NOTE — Telephone Encounter (Signed)
Patient requests refill on Hydrocodone/Acetaminophen 5-325 mgs.  Qty  28   Sig: TAKE 1 TABLET BY MOUTH EVERY 6 HOURS AS NEEDED FOR UP TO 7 DAYS FOR MODERATE PAIN.  Patient states she uses [facility]

## 2019-07-24 ENCOUNTER — Other Ambulatory Visit: Payer: Self-pay | Admitting: Orthopedic Surgery

## 2019-07-24 DIAGNOSIS — G8918 Other acute postprocedural pain: Secondary | ICD-10-CM

## 2019-07-24 MED ORDER — HYDROCODONE-ACETAMINOPHEN 5-325 MG PO TABS: ORAL_TABLET | ORAL | 0 refills | Status: DC

## 2019-07-24 NOTE — Telephone Encounter (Signed)
Patient requests refill on Hydrocodone/Acetaminophen 5-325  Mgs.  Qty 28  Sig: TAKE 1 TABLET BY MOUTH EVERY 6 HOURS AS NEEDED FOR UP TO 7 DAYS FOR MODERATE PAIN.       Patient uses [facility]

## 2019-07-31 ENCOUNTER — Other Ambulatory Visit: Payer: Self-pay | Admitting: Orthopedic Surgery

## 2019-07-31 DIAGNOSIS — G8918 Other acute postprocedural pain: Secondary | ICD-10-CM

## 2019-07-31 NOTE — Telephone Encounter (Signed)
Patient requests refill on Hydrocodone/Acetaminophen 5-325  Mgs.  Qty  28  Sig: TAKE 1 TABLET BY MOUTH EVERY 6 HOURS AS NEEDED FOR UP TO 7 DAYS FOR MODERATE PAIN.  Patient states she uses [facility]

## 2019-08-01 DIAGNOSIS — K219 Gastro-esophageal reflux disease without esophagitis: Secondary | ICD-10-CM | POA: Diagnosis not present

## 2019-08-01 DIAGNOSIS — I10 Essential (primary) hypertension: Secondary | ICD-10-CM | POA: Diagnosis not present

## 2019-08-01 MED ORDER — HYDROCODONE-ACETAMINOPHEN 5-325 MG PO TABS: ORAL_TABLET | ORAL | 0 refills | Status: DC

## 2019-08-06 ENCOUNTER — Telehealth: Payer: Self-pay

## 2019-08-06 NOTE — Telephone Encounter (Signed)
Approval was received from [email or web address] for [employer]. Approval letter will be scanned in chart when received.

## 2019-08-06 NOTE — Telephone Encounter (Signed)
PA for [employer] has been submitted through [email or web address]. waiting on an approval or denial.

## 2019-08-07 ENCOUNTER — Other Ambulatory Visit: Payer: Self-pay | Admitting: Orthopedic Surgery

## 2019-08-07 DIAGNOSIS — G8918 Other acute postprocedural pain: Secondary | ICD-10-CM

## 2019-08-07 DIAGNOSIS — M461 Sacroiliitis, not elsewhere classified: Secondary | ICD-10-CM | POA: Diagnosis not present

## 2019-08-07 DIAGNOSIS — Z79891 Long term (current) use of opiate analgesic: Secondary | ICD-10-CM | POA: Diagnosis not present

## 2019-08-07 DIAGNOSIS — M130 Polyarthritis, unspecified: Secondary | ICD-10-CM | POA: Diagnosis not present

## 2019-08-07 DIAGNOSIS — I10 Essential (primary) hypertension: Secondary | ICD-10-CM | POA: Diagnosis not present

## 2019-08-07 MED ORDER — HYDROCODONE-ACETAMINOPHEN 5-325 MG PO TABS: ORAL_TABLET | ORAL | 0 refills | Status: DC

## 2019-08-07 NOTE — Telephone Encounter (Signed)
Patient requests refill on Hydrocodone/Acetaminophen 5-325  Mgs.  Qty  28  Sig: TAKE 1 TABLET BY MOUTH EVERY 6 HOURS AS NEEDED FOR UP TO 7 DAYS FOR MODERATE PAIN.  Patient states she uses [facility]

## 2019-08-14 ENCOUNTER — Other Ambulatory Visit: Payer: Self-pay | Admitting: Orthopedic Surgery

## 2019-08-14 DIAGNOSIS — G8918 Other acute postprocedural pain: Secondary | ICD-10-CM

## 2019-08-14 MED ORDER — HYDROCODONE-ACETAMINOPHEN 5-325 MG PO TABS: ORAL_TABLET | ORAL | 0 refills | Status: DC

## 2019-08-14 NOTE — Telephone Encounter (Signed)
Patient requests refill on Hydrocodone/Acetaminophen 5-325  Mgs.  Qt  28  Sig: TAKE 1 TABLET BY MOUTH EVERY 6 HOURS AS NEEDED FOR UP TO 7 DAYS FOR MODERATE PAIN.        Patient states she uses [facility]

## 2019-08-21 ENCOUNTER — Other Ambulatory Visit: Payer: Self-pay | Admitting: Orthopedic Surgery

## 2019-08-21 DIAGNOSIS — G8918 Other acute postprocedural pain: Secondary | ICD-10-CM

## 2019-08-21 MED ORDER — HYDROCODONE-ACETAMINOPHEN 5-325 MG PO TABS: ORAL_TABLET | ORAL | 0 refills | Status: DC

## 2019-08-21 NOTE — Telephone Encounter (Signed)
Patient called for refill: °HYDROcodone-acetaminophen (NORCO/VICODIN) 5-325 MG tablet 28 tablet   ° - [facility] ° °

## 2019-08-28 ENCOUNTER — Other Ambulatory Visit: Payer: Self-pay | Admitting: Orthopedic Surgery

## 2019-08-28 DIAGNOSIS — G8918 Other acute postprocedural pain: Secondary | ICD-10-CM

## 2019-08-28 MED ORDER — HYDROCODONE-ACETAMINOPHEN 5-325 MG PO TABS: 1.0000 | ORAL_TABLET | Freq: Three times a day (TID) | ORAL | 0 refills | Status: DC | PRN

## 2019-08-28 NOTE — Telephone Encounter (Signed)
Patient requests refill on Hydrocodone/Acetaminophen 5-325  Mgs.  Qty  28  Sig: TAKE 1 TABLET BY MOUTH EVERY 6 HOURS AS NEEDED FOR UP TO 7 DAYS FOR MODERATE PAIN.       Patient states she uses [facility]

## 2019-08-30 DIAGNOSIS — K219 Gastro-esophageal reflux disease without esophagitis: Secondary | ICD-10-CM | POA: Diagnosis not present

## 2019-08-30 DIAGNOSIS — E785 Hyperlipidemia, unspecified: Secondary | ICD-10-CM | POA: Diagnosis not present

## 2019-09-04 ENCOUNTER — Other Ambulatory Visit: Payer: Self-pay | Admitting: Orthopedic Surgery

## 2019-09-04 ENCOUNTER — Ambulatory Visit: Payer: Medicare Other | Admitting: Gastroenterology

## 2019-09-04 DIAGNOSIS — G8918 Other acute postprocedural pain: Secondary | ICD-10-CM

## 2019-09-04 MED ORDER — HYDROCODONE-ACETAMINOPHEN 5-325 MG PO TABS: 1.0000 | ORAL_TABLET | Freq: Three times a day (TID) | ORAL | 0 refills | Status: DC | PRN

## 2019-09-04 NOTE — Telephone Encounter (Signed)
Patient called for refill:  HYDROcodone-acetaminophen (NORCO/VICODIN) 5-325 MG tablet 21 tablet  -[facility]  

## 2019-09-07 ENCOUNTER — Other Ambulatory Visit: Payer: Self-pay

## 2019-09-07 ENCOUNTER — Ambulatory Visit (INDEPENDENT_AMBULATORY_CARE_PROVIDER_SITE_OTHER): Payer: Medicare Other | Admitting: Gastroenterology

## 2019-09-07 ENCOUNTER — Encounter: Payer: Self-pay | Admitting: Internal Medicine

## 2019-09-07 ENCOUNTER — Encounter: Payer: Self-pay | Admitting: Gastroenterology

## 2019-09-07 VITALS — BP 179/95 | HR 80 | Temp 99.8°F | Ht 66.0 in | Wt 189.8 lb

## 2019-09-07 DIAGNOSIS — K5900 Constipation, unspecified: Secondary | ICD-10-CM | POA: Diagnosis not present

## 2019-09-07 DIAGNOSIS — K7469 Other cirrhosis of liver: Secondary | ICD-10-CM | POA: Diagnosis not present

## 2019-09-07 DIAGNOSIS — K219 Gastro-esophageal reflux disease without esophagitis: Secondary | ICD-10-CM

## 2019-09-07 MED ORDER — PANTOPRAZOLE SODIUM 40 MG PO TBEC: 40.0000 mg | DELAYED_RELEASE_TABLET | Freq: Every day | ORAL | 3 refills | Status: DC

## 2019-09-07 MED ORDER — TRULANCE 3 MG PO TABS: 3.0000 mg | ORAL_TABLET | Freq: Every day | ORAL | 3 refills | Status: DC

## 2019-09-07 NOTE — Progress Notes (Signed)
Referring Provider: Rosita Fire, MD Primary Care Physician:  Rosita Fire, MD Primary GI: Dr. Gala Romney   Chief Complaint  Patient presents with  . Follow-up    Cirrhosis    HPI:   Colleen Lee is a 64 y.o. 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 Evaluated by Vascular previously with recommendations to return as needed. She has been doing well since that time. Returning for follow-up of cirrhosis, constipation, and GERD.  Advanced fibrosis, concern for cirrhosis: [place] due now. Immune to Hep A. Completed Hep B vaccinations. EGD due this year. Reviewing ultrasounds, she had nodular contours last noted in 2018 but imaging thereafter with coarse liver, spleen appeared normal in March 2020. Thrombocytopenia from 2 years ago resolved. On last EGD in 2019, she did not have any stigmata of portal hypertension. Previously portal gastropathy noted in 2017. No alcohol use. Smokes about a pack per day. Was smoking 2 packs per day, now down to a pack. Insurance doesn't pay for patches.   GERD: Protonix once daily. No N/V. Good appetite. Still taking megace that was prescribed for poor appetite, which is now resolved.   Constipation: Trulance once daily. BM about twice per day. No rectal bleeding.   Past Medical History:  Diagnosis Date  . Asthma   . Chronic abdominal pain   . Chronic back pain   . Chronic constipation   . Cirrhosis ([place])    Metavir score F4 on elastography 2015  . Cyclical  vomiting   . Fibroids   . Fibromyalgia   . GERD (gastroesophageal reflux disease)   . H. pylori infection 2014   treated with pylera, had to be treated again as it was not eradicated. Urea breath test then negative after subsequent treatment.   . [name]    HCV RNA positive 09/2012  . Hypertension   . Marijuana use   . Nausea and vomiting    chronic, recurrent  . PONV (postoperative nausea and vomiting)    pt thinks maybe once she had N&V  . Sciatica of left side     Past Surgical History:  Procedure Laterality Date  . BALLOON DILATION  12/20/2017   Procedure: BALLOON DILATION;  Surgeon: Danie Binder, MD;  [facility];  Service: Endoscopy;;  pyloric dilation  . BIOPSY  12/20/2017   Procedure: BIOPSY;  Surgeon: Danie Binder, MD;  [facility];  Service: Endoscopy;;  duodenal and gastric biopsy  . COLONOSCOPY  01/18/2008   SHF:WYOVZC rectum.  Long redundant colon, a diminutive sigmoid polyp status post cold biopsy removed. Hyperplastic polyp. Repeat colonoscopy June 2014 due to family history of colon cancer  . COLONOSCOPY WITH ESOPHAGOGASTRODUODENOSCOPY (EGD) N/A 11/02/2012   HYI:FOYDXAJO gastric mucosa of doubtful, +H.pylori. Incomplete colonoscopy due to patient unable to tolerate exam, proximal colon seen. Patient refused ACBE.  Marland Kitchen COLONOSCOPY WITH PROPOFOL N/A 03/08/2013   Dr. Gala Romney: colonic polyp-removed as scribed above. Internal Hemorrhoids. Pathology did not reveal any colonic tissue, only mucus. SURVEILLANCE DUE Aug 2019  . COLONOSCOPY WITH PROPOFOL N/A 01/19/2018   Dr.  Rourk: 6 mm cecal inflammatoy polyp, otherwise normal., Surveilance in 5 year s  . ESOPHAGEAL DILATION  12/20/2017   Procedure: ESOPHAGEAL DILATION;  Surgeon: Danie Binder, MD;  [facility];  Service: Endoscopy;;  . ESOPHAGOGASTRODUODENOSCOPY  01/18/2008   RMR: Normal esophagus, normal  stomach  . ESOPHAGOGASTRODUODENOSCOPY (EGD) WITH PROPOFOL N/A 02/09/2016   Normal  esophagus, small hiatal hernia, portal hypertensive gastropathy, normal second portion of duodenum. Due in July 2019  . ESOPHAGOGASTRODUODENOSCOPY (EGD) WITH PROPOFOL N/A 12/20/2017   Dr. Oneida Alar: benign appearing esophageal stenosis s/p dilation, mild pyloric stenosis s/p biopsy and dilation, mild duodenitis.   Marland Kitchen HYSTEROSCOPY  05/11/2016   Procedure: HYSTEROSCOPY;  Surgeon: Jonnie Kind, MD;  Location: [facility];  Service: Gynecology;;  . KNEE SURGERY     left knee  . MULTIPLE EXTRACTIONS WITH ALVEOLOPLASTY N/A 10/15/2013   Procedure: MULTIPLE EXTRACTION WITH ALVEOLOPLASTY;  Surgeon: Gae Bon, DDS;  Location: [facility];  Service: Oral Surgery;  Laterality: N/A;  . POLYPECTOMY N/A 03/08/2013   Procedure: POLYPECTOMY;  Surgeon: Daneil Dolin, MD;  Location: [facility];  Service: Endoscopy;  Laterality: N/A;  . POLYPECTOMY N/A 05/11/2016   Procedure: REMOVAL OF ENDOMETRIAL POLYP;  Surgeon: Jonnie Kind, MD;  Location: [facility];  Service: Gynecology;  Laterality: N/A;  . RESECTION DISTAL CLAVICAL Right 01/30/2019   Procedure: RESECTION DISTAL CLAVICAL;  Surgeon: Carole Civil, MD;  Location: [facility];  Service: Orthopedics;  Laterality: Right;  . SHOULDER OPEN ROTATOR CUFF REPAIR Right 01/30/2019   Procedure: ROTATOR CUFF REPAIR SHOULDER OPEN;  Surgeon: Carole Civil, MD;  Location: [facility];  Service: Orthopedics;  Laterality: Right;  pt to arrive at 7:30 for PICC at 8:00  . TOTAL KNEE ARTHROPLASTY Left 02/05/2015   Procedure: LEFT TOTAL KNEE ARTHROPLASTY;  Surgeon: Carole Civil, MD;  Location: [facility];  Service: Orthopedics;  Laterality: Left;    Current Outpatient Medications  Medication Sig Dispense Refill  . albuterol (PROVENTIL HFA;VENTOLIN HFA) 108 (90 Base) MCG/ACT inhaler Inhale 1-2 puffs into the lungs every 6 (six) hours as needed for wheezing or shortness of breath.     . budesonide-formoterol (SYMBICORT) 160-4.5 MCG/ACT inhaler Inhale 2 puffs into the lungs daily.     .  Cyanocobalamin (VITAMIN B 12 PO) Take 1 tablet by mouth daily.    . cyclobenzaprine (FLEXERIL) 5 MG tablet Take 1 tablet (5 mg total) by mouth 3 (three) times daily as needed for muscle spasms. 42 tablet 2  . gabapentin (NEURONTIN) 100 MG capsule Take 1 capsule (100 mg total) by mouth 3 (three) times daily. (Patient taking differently: Take 100 mg by mouth daily. ) 90 capsule 2  . hydrALAZINE (APRESOLINE) 50 MG tablet Take 1 tablet (50 mg total) by mouth every 8 (eight) hours. 90 tablet 1  . hydrochlorothiazide (HYDRODIURIL) 12.5 MG tablet Take 12.5 mg by mouth daily.     Marland Kitchen HYDROcodone-acetaminophen (NORCO/VICODIN) 5-325 MG tablet Take 1 tablet by mouth every 8 (eight) hours as needed for up to 7 days for moderate pain. TAKE 1 TABLET BY MOUTH EVERY 6 HOURS AS NEEDED FOR UP TO 7 DAYS FOR MODERATE PAIN. 21 tablet 0  . hydrOXYzine (ATARAX/VISTARIL) 10 MG tablet Take 10 mg by mouth daily.     Marland Kitchen ibuprofen (ADVIL) 800 MG tablet Take 1 tablet (800 mg total) by mouth every 8 (eight) hours as needed. 90 tablet 0  . labetalol (NORMODYNE) 100 MG tablet Take 2 tablets (200 mg total) by  mouth 2 (two) times daily. 120 tablet 1  . losartan (COZAAR) 100 MG tablet Take 1 tablet (100 mg total) by mouth daily. 30 tablet 1  . megestrol (MEGACE) 40 MG tablet Take 40 mg by mouth daily.    . ondansetron (ZOFRAN ODT) 4 MG disintegrating tablet Take 1 tablet (4 mg total) by mouth 3 (three) times daily. '4mg'$  ODT q4 hours prn nausea/vomit (Patient taking differently: Take 4 mg by mouth as needed for nausea or vomiting. ) 60 tablet 0  . pantoprazole (PROTONIX) 40 MG tablet Take 1 tablet (40 mg total) by mouth daily. 30 minutes before breakfast 90 tablet 3  . Plecanatide (TRULANCE) 3 MG TABS Take 3 mg by mouth daily. 90 tablet 3   No current facility-administered medications for this visit.    Allergies as of 09/07/2019 - Review Complete 09/07/2019  Allergen Reaction Noted  . Penicillins Rash     Family History  Problem  Relation Age of Onset  . Colon cancer Brother 35          . Multiple myeloma Brother   . Liver cancer Sister   . Prostate cancer Brother   . Pancreatic cancer Brother   . Cancer Mother        breast  . Asthma Mother     Social History   Socioeconomic History  . Marital status: Single    Spouse name: Not on file  . Number of children: 0  . Years of education: Not on file  . Highest education level: Not on file  Occupational History  . Occupation: umemployed   Tobacco Use  . Smoking status: Current Every Day Smoker    Packs/day: 1.00    Years: 40.00    Pack years: 40.00    Types: Cigarettes  . Smokeless tobacco: Never Used  . Tobacco comment: Smokes one pack of cigarettes daily  Substance and Sexual Activity  . Alcohol use: Not Currently    Alcohol/week: 1.0 standard drinks    Types: 1 Cans of beer per week    Comment: occas; denied 03/01/19  . Drug use: Not Currently    Types: Marijuana    Comment: history of marijuana in past- almost 2 years ago  . Sexual activity: Never    Birth control/protection: None  Other Topics Concern  . Not on file  Social History Narrative  . Not on file   Social Determinants of Health   Financial Resource Strain:   . Difficulty of Paying Living Expenses: Not on file  Food Insecurity:   . Worried About Charity fundraiser in the Last Year: Not on file  . Ran Out of Food in the Last Year: Not on file  Transportation Needs:   . Lack of Transportation (Medical): Not on file  . Lack of Transportation (Non-Medical): Not on file  Physical Activity:   . Days of Exercise per Week: Not on file  . Minutes of Exercise per Session: Not on file  Stress:   . Feeling of Stress : Not on file  Social Connections:   . Frequency of Communication with Friends and Family: Not on file  . Frequency of Social Gatherings with Friends and Family: Not on file  . Attends Religious Services: Not on file  . Active Member of Clubs or Organizations: Not on  file  . Attends Archivist Meetings: Not on file  . Marital Status: Not on file    Review of Systems: Gen: Denies fever, chills, anorexia. Denies  fatigue, weakness, weight loss.  CV: Denies chest pain, palpitations, syncope, peripheral edema, and claudication. Resp: Denies dyspnea at rest, cough, wheezing, coughing up blood, and pleurisy. GI: see HPI  Derm: Denies rash, itching, dry skin Psych: Denies depression, anxiety, memory loss, confusion. No homicidal or suicidal ideation.  Heme: Denies bruising, bleeding, and enlarged lymph nodes.  Physical Exam: BP (!) 179/95   Pulse 80   Temp 99.8 F (37.7 C) (Oral)   Ht '5\' 6"'$  (1.676 m)   Wt 189 lb 12.8 oz (86.1 kg)   BMI 30.63 kg/m  General:   Alert and oriented. No distress noted. Pleasant and cooperative.  Head:  Normocephalic and atraumatic. Eyes:  Conjuctiva clear without scleral icterus. Lungs: mild expiratory wheeze bilaterally Cardiac: S1 S2 present without murmurs Abdomen:  +BS, soft, larger AP diameter round but soft, No rebound or guarding. No HSM or masses noted. Msk:  Symmetrical without gross deformities. Normal posture. Extremities:  Without edema. Neurologic:  Alert and  oriented x4 Psych:  Alert and cooperative. Normal mood and affect.  ASSESSMENT: Colleen Lee is a 64 y.o. female presenting today with a history of advanced Metavir score (4) in past and concern for cirrhosis due to Hep C/ETOH, successfully treated for Hep C and achieving SVR, chronic GERD and constipation, presenting in follow-up.   Advanced fibrosis, possible underlying cirrhosis: Last [place] a year ago. With advanced fibrosis historically, favor serial ultrasounds as we have been doing. She does not appear to have advanced liver disease after review of last EGD in 2019 (no stigmata of portal hypertension), normal platelet count, spleen appearing normal on ultrasound last year. However, will continue to follow closely as she is at risk for  progression to cirrhosis in light of prior advanced fibrosis score. Elastography could have been an "overcall", but we discussed close monitoring. Will order US abdomen complete now, routine labs, and arrange for routine screening EGD.   Constipation: well managed with Trulance.  GERD: continues to do well with Protonix daily.    PLAN:  Proceed with screening upper endoscopy in the near future with Dr. Gala Romney. The risks, benefits, and alternatives have been discussed in detail with patient. They have stated understanding and desire to proceed. Propofol due to polypharmacy  Routine labs and [place] ordered  Will continue to follow every 6 months.  Protonix and Trulance refilled  Recommend contact with PCP regarding continuing Megace: historically this was for appetite, but she has a wonderful appetite now and now dealing with increased weight gain.  Colonoscopy 2024.   Annitta Needs, PhD, ANP-BC [facility]

## 2019-09-07 NOTE — Patient Instructions (Signed)
We are arranging an upper endoscopy with Dr. Gala Romney for screening purposes.  I have ordered blood work to have done that is routine and checking your blood count and liver.  We have also ordered an ultrasound of your liver. As we discussed, you had evidence for stiffness in your liver and possible scarring, but your liver, blood work has been looking good after treatment for Hep C. We will still be on the cautious side and follow you every 6 months with ultrasounds and visits. The endoscopy is to check for any enlarged blood vessels in your esophagus that can happen with cirrhosis.  We will see you in 6 months! I refilled the Protonix and Trulance.  I enjoyed seeing you again today! As you know, I value our relationship and want to provide genuine, compassionate, and quality care. I welcome your feedback. If you receive a survey regarding your visit,  I greatly appreciate you taking time to fill this out. See you next time!  Annitta Needs, PhD, ANP-BC [facility]

## 2019-09-10 ENCOUNTER — Other Ambulatory Visit: Payer: Self-pay

## 2019-09-10 ENCOUNTER — Telehealth: Payer: Self-pay

## 2019-09-10 NOTE — Telephone Encounter (Signed)
PA for EGD submitted via [facility]. Case approved. PA# [id number], valid 11/12/19-02/10/20.

## 2019-09-10 NOTE — Progress Notes (Signed)
Cc'ed to pcp °

## 2019-09-10 NOTE — Telephone Encounter (Signed)
Pre-op and COVID test 11/09/19. Letter mailed with procedure instructions.

## 2019-09-10 NOTE — Telephone Encounter (Signed)
[place] abd complete scheduled for 09/14/19 at 9:30am, arrive at 9:15am. NPO after midnight prior to test.  Called and informed pt of [place] appt. EGD w/Prop w/RMR scheduled for 11/12/19 at 11:15am. Orders entered.

## 2019-09-11 ENCOUNTER — Other Ambulatory Visit: Payer: Self-pay | Admitting: Orthopedic Surgery

## 2019-09-11 DIAGNOSIS — G8918 Other acute postprocedural pain: Secondary | ICD-10-CM

## 2019-09-11 MED ORDER — HYDROCODONE-ACETAMINOPHEN 5-325 MG PO TABS: 1.0000 | ORAL_TABLET | Freq: Three times a day (TID) | ORAL | 0 refills | Status: DC | PRN

## 2019-09-11 NOTE — Telephone Encounter (Signed)
Patient called for refill:  HYDROcodone-acetaminophen (NORCO/VICODIN) 5-325 MG tablet 21 tablet  -[facility]  

## 2019-09-14 ENCOUNTER — Ambulatory Visit (HOSPITAL_COMMUNITY): Payer: Medicare Other

## 2019-09-18 ENCOUNTER — Other Ambulatory Visit: Payer: Self-pay | Admitting: Orthopedic Surgery

## 2019-09-18 DIAGNOSIS — G8918 Other acute postprocedural pain: Secondary | ICD-10-CM

## 2019-09-18 MED ORDER — HYDROCODONE-ACETAMINOPHEN 5-325 MG PO TABS: 1.0000 | ORAL_TABLET | Freq: Three times a day (TID) | ORAL | 0 refills | Status: DC | PRN

## 2019-09-18 NOTE — Telephone Encounter (Signed)
Patient requests refill on Hydrocodone/Actaminophen 5-325  Mgs.  Qty  21  Sig: Take 1 tablet by mouth every 8 (eight) hours as needed for up to 7 days for moderate pain. TAKE 1 TABLET BY MOUTH EVERY 6 HOURS AS NEEDED FOR UP TO 7 DAYS FOR MODERATE PAIN.  Patient states she uses [facility]

## 2019-09-19 ENCOUNTER — Other Ambulatory Visit: Payer: Self-pay

## 2019-09-19 ENCOUNTER — Ambulatory Visit (INDEPENDENT_AMBULATORY_CARE_PROVIDER_SITE_OTHER): Payer: Medicare Other | Admitting: Orthopedic Surgery

## 2019-09-19 VITALS — BP 110/60 | HR 78 | Temp 97.2°F | Ht 66.0 in | Wt 188.0 lb

## 2019-09-19 DIAGNOSIS — Z9889 Other specified postprocedural states: Secondary | ICD-10-CM

## 2019-09-19 DIAGNOSIS — M25511 Pain in right shoulder: Secondary | ICD-10-CM | POA: Diagnosis not present

## 2019-09-19 DIAGNOSIS — M542 Cervicalgia: Secondary | ICD-10-CM | POA: Diagnosis not present

## 2019-09-19 DIAGNOSIS — G8929 Other chronic pain: Secondary | ICD-10-CM | POA: Diagnosis not present

## 2019-09-19 MED ORDER — CYCLOBENZAPRINE HCL 5 MG PO TABS: 5.0000 mg | ORAL_TABLET | Freq: Three times a day (TID) | ORAL | 2 refills | Status: DC | PRN

## 2019-09-19 MED ORDER — IBUPROFEN 800 MG PO TABS: 800.0000 mg | ORAL_TABLET | Freq: Three times a day (TID) | ORAL | 0 refills | Status: DC | PRN

## 2019-09-19 NOTE — Patient Instructions (Signed)
For neck pain   Heat  Ibuprofen Muscle relaxer Exercises

## 2019-09-19 NOTE — Progress Notes (Signed)
Chief Complaint  Patient presents with  . Follow-up    Recheck on right RCR, DOS 01-30-19.    Colleen Lee is now 8 months after cuff repair on the right shoulder she has improved in terms of overall pain and improvement in function although she has never gained overall range of motion and still has some discomfort when she sleeping on the shoulder at night however, I think she has reached all of her goals of tolerable pain improved activities of daily living and improve motion  She is having some trouble with her right upper neck area.  Her husband has colon cancer she has to take care of him she is lifting him all the time and she complains of some discomfort in her neck which is not improved with a heating pad  She is on chronic hydrocodone for chronic pain and seems to do well with that as well as gabapentin  She does take some ibuprofen on occasion.  She does not have any numbness or tingling in her upper extremity and no weakness in her hand  Past Medical History:  Diagnosis Date  . Asthma   . Chronic abdominal pain   . Chronic back pain   . Chronic constipation   . Cirrhosis ([place])    Metavir score F4 on elastography 2015  . Cyclical vomiting   . Fibroids   . Fibromyalgia   . GERD (gastroesophageal reflux disease)   . H. pylori infection 2014   treated with pylera, had to be treated again as it was not eradicated. Urea breath test then negative after subsequent treatment.   . [name]    HCV RNA positive 09/2012  . Hypertension   . Marijuana use   . Nausea and vomiting    chronic, recurrent  . PONV (postoperative nausea and vomiting)    pt thinks maybe once she had N&V  . Sciatica of left side    Allergies  Allergen Reactions  . Penicillins Rash    Did it involve swelling of the face/tongue/throat, SOB, or low BP? No Did it involve sudden or severe rash/hives, skin peeling, or any reaction on the inside of your mouth or nose? No Did you need to seek medical attention at a  hospital or doctor's office? No When did it last happen?Many years ago If all above answers are "NO", may proceed with cephalosporin use.      BP 110/60   Pulse 78   Temp (!) 97.2 F (36.2 C)   Ht 5\' 6"  (1.676 m)   Wt 188 lb (85.3 kg)   BMI 30.34 kg/m   Well-developed well-nourished grooming hygiene normal normal body weight awake alert and oriented x3 mood pleasant affect flat  Right shoulder flexion 120 degrees 5- out of 5 strength in supraspinatus no instability muscle tone normal no tenderness incisions healed well  Tenderness in the right upper neck with normal range of motion mild crepitance on range of motion negative Spurling sign  Encounter Diagnoses  Name Primary?  . S/P right rotator cuff repair 01/30/19   . Neck pain Yes  . Chronic right shoulder pain    Meds ordered this encounter  Medications  . cyclobenzaprine (FLEXERIL) 5 MG tablet    Sig: Take 1 tablet (5 mg total) by mouth 3 (three) times daily as needed for muscle spasms.    Dispense:  42 tablet    Refill:  2  . ibuprofen (ADVIL) 800 MG tablet    Sig: Take 1 tablet (800  mg total) by mouth every 8 (eight) hours as needed.    Dispense:  90 tablet    Refill:  0    We are going to keep her on Advil start some muscle relaxers use a heating pad we gave her some exercises to do for her  She will come back in 3 months to follow-up for chronic pain and chronic pain management

## 2019-09-24 ENCOUNTER — Other Ambulatory Visit: Payer: Self-pay

## 2019-09-24 ENCOUNTER — Ambulatory Visit (HOSPITAL_COMMUNITY)
Admission: RE | Admit: 2019-09-24 | Discharge: 2019-09-24 | Disposition: A | Discharge status: Home / Self Care | Payer: Medicare Other | Source: Ambulatory Visit | Attending: Gastroenterology | Admitting: Gastroenterology

## 2019-09-24 DIAGNOSIS — K7460 Unspecified cirrhosis of liver: Secondary | ICD-10-CM | POA: Diagnosis not present

## 2019-09-24 DIAGNOSIS — K7469 Other cirrhosis of liver: Secondary | ICD-10-CM | POA: Diagnosis not present

## 2019-09-25 ENCOUNTER — Other Ambulatory Visit: Payer: Self-pay | Admitting: Orthopedic Surgery

## 2019-09-25 DIAGNOSIS — G8918 Other acute postprocedural pain: Secondary | ICD-10-CM

## 2019-09-25 MED ORDER — HYDROCODONE-ACETAMINOPHEN 5-325 MG PO TABS: 1.0000 | ORAL_TABLET | Freq: Three times a day (TID) | ORAL | 0 refills | Status: DC | PRN

## 2019-09-25 NOTE — Telephone Encounter (Signed)
Patient requests refill on Hydrocodone/Acetaminophen 5-325  Mgs.  Qty  21  Sig: Take 1 tablet by mouth every 8 (eight) hours as needed for up to 7 days for moderate pain. TAKE 1 TABLET BY MOUTH EVERY 6 HOURS AS NEEDED FOR UP TO 7 DAYS FOR MODERATE PAIN.  Patient uses [facility]

## 2019-09-28 DIAGNOSIS — K7469 Other cirrhosis of liver: Secondary | ICD-10-CM | POA: Diagnosis not present

## 2019-09-30 DIAGNOSIS — K219 Gastro-esophageal reflux disease without esophagitis: Secondary | ICD-10-CM | POA: Diagnosis not present

## 2019-09-30 DIAGNOSIS — I10 Essential (primary) hypertension: Secondary | ICD-10-CM | POA: Diagnosis not present

## 2019-10-01 LAB — CBC WITH DIFFERENTIAL/PLATELET
Absolute Monocytes: 470 cells/uL (ref 200–950)
Basophils Absolute: 41 cells/uL (ref 0–200)
Basophils Relative: 0.7 %
Eosinophils Absolute: 128 cells/uL (ref 15–500)
Eosinophils Relative: 2.2 %
HCT: 38.8 % (ref 35.0–45.0)
Hemoglobin: 13.3 g/dL (ref 11.7–15.5)
Lymphs Abs: 1473 cells/uL (ref 850–3900)
MCH: 33.7 pg — ABNORMAL HIGH (ref 27.0–33.0)
MCHC: 34.3 g/dL (ref 32.0–36.0)
MCV: 98.2 fL (ref 80.0–100.0)
MPV: 11.6 fL (ref 7.5–12.5)
Monocytes Relative: 8.1 %
Neutro Abs: 3689 cells/uL (ref 1500–7800)
Neutrophils Relative %: 63.6 %
Platelets: 191 10*3/uL (ref 140–400)
RBC: 3.95 10*6/uL (ref 3.80–5.10)
RDW: 12.5 % (ref 11.0–15.0)
Total Lymphocyte: 25.4 %
WBC: 5.8 10*3/uL (ref 3.8–10.8)

## 2019-10-01 LAB — COMPLETE METABOLIC PANEL WITH GFR
AG Ratio: 1.2 (calc) (ref 1.0–2.5)
ALT: 29 U/L (ref 6–29)
AST: 37 U/L — ABNORMAL HIGH (ref 10–35)
Albumin: 4.2 g/dL (ref 3.6–5.1)
Alkaline phosphatase (APISO): 59 U/L (ref 37–153)
BUN/Creatinine Ratio: 10 (calc) (ref 6–22)
BUN: 11 mg/dL (ref 7–25)
CO2: 20 mmol/L (ref 20–32)
Calcium: 10.7 mg/dL — ABNORMAL HIGH (ref 8.6–10.4)
Chloride: 100 mmol/L (ref 98–110)
Creat: 1.06 mg/dL — ABNORMAL HIGH (ref 0.50–0.99)
GFR, Est African American: 64 mL/min/{1.73_m2} (ref >=60)
GFR, Est Non African American: 55 mL/min/{1.73_m2} — ABNORMAL LOW (ref >=60)
Globulin: 3.4 g/dL (calc) (ref 1.9–3.7)
Glucose, Bld: 85 mg/dL (ref 65–139)
Potassium: 4 mmol/L (ref 3.5–5.3)
Sodium: 134 mmol/L — ABNORMAL LOW (ref 135–146)
Total Bilirubin: 0.7 mg/dL (ref 0.2–1.2)
Total Protein: 7.6 g/dL (ref 6.1–8.1)

## 2019-10-01 LAB — PROTIME-INR
INR: 1
Prothrombin Time: 10.4 s (ref 9.0–11.5)

## 2019-10-01 LAB — AFP TUMOR MARKER: AFP-Tumor Marker: 2.9 ng/mL

## 2019-10-02 ENCOUNTER — Other Ambulatory Visit: Payer: Self-pay | Admitting: Orthopedic Surgery

## 2019-10-02 DIAGNOSIS — G8918 Other acute postprocedural pain: Secondary | ICD-10-CM

## 2019-10-02 MED ORDER — HYDROCODONE-ACETAMINOPHEN 5-325 MG PO TABS: 1.0000 | ORAL_TABLET | Freq: Three times a day (TID) | ORAL | 0 refills | Status: DC | PRN

## 2019-10-02 NOTE — Telephone Encounter (Signed)
Patient requests refill on Hydrocodone/Acetaminophen 5-325  Mgs.  Qty  21      Sig: Take 1 tablet by mouth every 8 (eight) hours as needed for up to 7 days for moderate pain. TAKE 1 TABLET BY MOUTH EVERY 6 HOURS AS NEEDED FOR UP TO 7 DAYS FOR MODERATE PAIN.       Patient states she uses [facility]  

## 2019-10-09 ENCOUNTER — Other Ambulatory Visit: Payer: Self-pay | Admitting: Orthopedic Surgery

## 2019-10-09 DIAGNOSIS — G8918 Other acute postprocedural pain: Secondary | ICD-10-CM

## 2019-10-09 MED ORDER — HYDROCODONE-ACETAMINOPHEN 5-325 MG PO TABS: 1.0000 | ORAL_TABLET | Freq: Three times a day (TID) | ORAL | 0 refills | Status: DC | PRN

## 2019-10-09 NOTE — Telephone Encounter (Signed)
Patient requests refill on Hydrocodone/Acetaminophen 5-325 mgs.  Qty 21   Sig: Take 1 tablet by mouth every 8 (eight) hours as needed for up to 7 days for moderate pain. TAKE 1 TABLET BY MOUTH EVERY 6 HOURS AS NEEDED FOR UP TO 7 DAYS FOR MODERATE PAIN.  Patient states she uses [facility] 

## 2019-10-16 ENCOUNTER — Other Ambulatory Visit: Payer: Self-pay | Admitting: Orthopedic Surgery

## 2019-10-16 DIAGNOSIS — G8918 Other acute postprocedural pain: Secondary | ICD-10-CM

## 2019-10-16 MED ORDER — HYDROCODONE-ACETAMINOPHEN 5-325 MG PO TABS: 1.0000 | ORAL_TABLET | Freq: Three times a day (TID) | ORAL | 0 refills | Status: DC | PRN

## 2019-10-16 NOTE — Telephone Encounter (Signed)
Patient requests refill on Hydrocodone/Acetaminophen 5-325  Mgs.  Qty  21      Sig: Take 1 tablet by mouth every 8 (eight) hours as needed for up to 7 days for moderate pain. TAKE 1 TABLET BY MOUTH EVERY 6 HOURS AS NEEDED FOR UP TO 7 DAYS FOR MODERATE PAIN.       Patient states she uses [facility]  

## 2019-10-17 ENCOUNTER — Emergency Department (HOSPITAL_COMMUNITY): Payer: Medicare Other

## 2019-10-17 ENCOUNTER — Inpatient Hospital Stay (HOSPITAL_COMMUNITY)
Admission: EM | Admit: 2019-10-17 | Discharge: 2019-10-20 | DRG: 439 | Disposition: A | Discharge status: Home / Self Care | Payer: Medicare Other | Attending: Internal Medicine | Admitting: Internal Medicine

## 2019-10-17 ENCOUNTER — Encounter (HOSPITAL_COMMUNITY): Payer: Self-pay | Admitting: *Deleted

## 2019-10-17 ENCOUNTER — Other Ambulatory Visit: Payer: Self-pay

## 2019-10-17 DIAGNOSIS — Z20822 Contact with and (suspected) exposure to covid-19: Secondary | ICD-10-CM | POA: Diagnosis not present

## 2019-10-17 DIAGNOSIS — F17210 Nicotine dependence, cigarettes, uncomplicated: Secondary | ICD-10-CM | POA: Diagnosis present

## 2019-10-17 DIAGNOSIS — Z800 Family history of malignant neoplasm of digestive organs: Secondary | ICD-10-CM

## 2019-10-17 DIAGNOSIS — Z7951 Long term (current) use of inhaled steroids: Secondary | ICD-10-CM | POA: Diagnosis not present

## 2019-10-17 DIAGNOSIS — Z807 Family history of other malignant neoplasms of lymphoid, hematopoietic and related tissues: Secondary | ICD-10-CM | POA: Diagnosis not present

## 2019-10-17 DIAGNOSIS — K760 Fatty (change of) liver, not elsewhere classified: Secondary | ICD-10-CM | POA: Diagnosis not present

## 2019-10-17 DIAGNOSIS — Z825 Family history of asthma and other chronic lower respiratory diseases: Secondary | ICD-10-CM | POA: Diagnosis not present

## 2019-10-17 DIAGNOSIS — N179 Acute kidney failure, unspecified: Secondary | ICD-10-CM | POA: Diagnosis present

## 2019-10-17 DIAGNOSIS — B182 Chronic viral hepatitis C: Secondary | ICD-10-CM | POA: Diagnosis present

## 2019-10-17 DIAGNOSIS — E860 Dehydration: Secondary | ICD-10-CM | POA: Diagnosis present

## 2019-10-17 DIAGNOSIS — K7030 Alcoholic cirrhosis of liver without ascites: Secondary | ICD-10-CM | POA: Diagnosis present

## 2019-10-17 DIAGNOSIS — R109 Unspecified abdominal pain: Secondary | ICD-10-CM

## 2019-10-17 DIAGNOSIS — M5432 Sciatica, left side: Secondary | ICD-10-CM | POA: Diagnosis present

## 2019-10-17 DIAGNOSIS — B1920 Unspecified viral hepatitis C without hepatic coma: Secondary | ICD-10-CM | POA: Diagnosis present

## 2019-10-17 DIAGNOSIS — J449 Chronic obstructive pulmonary disease, unspecified: Secondary | ICD-10-CM | POA: Diagnosis present

## 2019-10-17 DIAGNOSIS — M797 Fibromyalgia: Secondary | ICD-10-CM | POA: Diagnosis present

## 2019-10-17 DIAGNOSIS — Z79899 Other long term (current) drug therapy: Secondary | ICD-10-CM | POA: Diagnosis not present

## 2019-10-17 DIAGNOSIS — E871 Hypo-osmolality and hyponatremia: Secondary | ICD-10-CM | POA: Diagnosis present

## 2019-10-17 DIAGNOSIS — Z8042 Family history of malignant neoplasm of prostate: Secondary | ICD-10-CM | POA: Diagnosis not present

## 2019-10-17 DIAGNOSIS — Z791 Long term (current) use of non-steroidal anti-inflammatories (NSAID): Secondary | ICD-10-CM | POA: Diagnosis not present

## 2019-10-17 DIAGNOSIS — K7010 Alcoholic hepatitis without ascites: Secondary | ICD-10-CM | POA: Diagnosis present

## 2019-10-17 DIAGNOSIS — K859 Acute pancreatitis without necrosis or infection, unspecified: Secondary | ICD-10-CM | POA: Diagnosis not present

## 2019-10-17 DIAGNOSIS — E876 Hypokalemia: Secondary | ICD-10-CM

## 2019-10-17 DIAGNOSIS — G8929 Other chronic pain: Secondary | ICD-10-CM | POA: Diagnosis not present

## 2019-10-17 DIAGNOSIS — K852 Alcohol induced acute pancreatitis without necrosis or infection: Secondary | ICD-10-CM | POA: Diagnosis present

## 2019-10-17 DIAGNOSIS — K7460 Unspecified cirrhosis of liver: Secondary | ICD-10-CM | POA: Diagnosis present

## 2019-10-17 DIAGNOSIS — I10 Essential (primary) hypertension: Secondary | ICD-10-CM | POA: Diagnosis not present

## 2019-10-17 DIAGNOSIS — F1010 Alcohol abuse, uncomplicated: Secondary | ICD-10-CM | POA: Diagnosis present

## 2019-10-17 LAB — COMPREHENSIVE METABOLIC PANEL
ALT: 50 U/L — ABNORMAL HIGH (ref 0–44)
AST: 68 U/L — ABNORMAL HIGH (ref 15–41)
Albumin: 4.2 g/dL (ref 3.5–5.0)
Alkaline Phosphatase: 74 U/L (ref 38–126)
Anion gap: 13 (ref 5–15)
BUN: 14 mg/dL (ref 8–23)
CO2: 22 mmol/L (ref 22–32)
Calcium: 10.7 mg/dL — ABNORMAL HIGH (ref 8.9–10.3)
Chloride: 99 mmol/L (ref 98–111)
Creatinine, Ser: 2.07 mg/dL — ABNORMAL HIGH (ref 0.44–1.00)
GFR calc Af Amer: 29 mL/min — ABNORMAL LOW (ref >60)
GFR calc non Af Amer: 25 mL/min — ABNORMAL LOW (ref >60)
Glucose, Bld: 146 mg/dL — ABNORMAL HIGH (ref 70–99)
Potassium: 2.8 mmol/L — ABNORMAL LOW (ref 3.5–5.1)
Sodium: 134 mmol/L — ABNORMAL LOW (ref 135–145)
Total Bilirubin: 2.1 mg/dL — ABNORMAL HIGH (ref 0.3–1.2)
Total Protein: 9.3 g/dL — ABNORMAL HIGH (ref 6.5–8.1)

## 2019-10-17 LAB — CBC WITH DIFFERENTIAL/PLATELET
Abs Immature Granulocytes: 0.04 10*3/uL (ref 0.00–0.07)
Basophils Absolute: 0 10*3/uL (ref 0.0–0.1)
Basophils Relative: 0 %
Eosinophils Absolute: 0.1 10*3/uL (ref 0.0–0.5)
Eosinophils Relative: 1 %
HCT: 45.6 % (ref 36.0–46.0)
Hemoglobin: 15.2 g/dL — ABNORMAL HIGH (ref 12.0–15.0)
Immature Granulocytes: 0 %
Lymphocytes Relative: 12 %
Lymphs Abs: 1.1 10*3/uL (ref 0.7–4.0)
MCH: 32.9 pg (ref 26.0–34.0)
MCHC: 33.3 g/dL (ref 30.0–36.0)
MCV: 98.7 fL (ref 80.0–100.0)
Monocytes Absolute: 0.4 10*3/uL (ref 0.1–1.0)
Monocytes Relative: 4 %
Neutro Abs: 7.8 10*3/uL — ABNORMAL HIGH (ref 1.7–7.7)
Neutrophils Relative %: 83 %
Platelets: 212 10*3/uL (ref 150–400)
RBC: 4.62 MIL/uL (ref 3.87–5.11)
RDW: 13.1 % (ref 11.5–15.5)
WBC: 9.5 10*3/uL (ref 4.0–10.5)
nRBC: 0 % (ref 0.0–0.2)

## 2019-10-17 LAB — MAGNESIUM: Magnesium: 2.4 mg/dL (ref 1.7–2.4)

## 2019-10-17 LAB — LIPASE, BLOOD: Lipase: 187 U/L — ABNORMAL HIGH (ref 11–51)

## 2019-10-17 LAB — PHOSPHORUS: Phosphorus: 3.2 mg/dL (ref 2.5–4.6)

## 2019-10-17 MED ORDER — FOLIC ACID 1 MG PO TABS
1.0000 mg | ORAL_TABLET | Freq: Every day | ORAL | Status: DC
  Filled 2019-10-17: qty 1

## 2019-10-17 MED ORDER — THIAMINE HCL 100 MG/ML IJ SOLN: 100.0000 mg | Freq: Every day | INTRAMUSCULAR | Status: DC

## 2019-10-17 MED ORDER — ACETAMINOPHEN 650 MG RE SUPP: 650.0000 mg | Freq: Four times a day (QID) | RECTAL | Status: DC | PRN

## 2019-10-17 MED ORDER — THIAMINE HCL 100 MG PO TABS
100.0000 mg | ORAL_TABLET | Freq: Every day | ORAL | Status: DC
  Filled 2019-10-17: qty 1

## 2019-10-17 MED ORDER — HYDRALAZINE HCL 25 MG PO TABS
50.0000 mg | ORAL_TABLET | Freq: Three times a day (TID) | ORAL | Status: DC
  Administered 2019-10-18 – 2019-10-20 (×8): 50 mg via ORAL
  Filled 2019-10-17 (×9): qty 2

## 2019-10-17 MED ORDER — GABAPENTIN 100 MG PO CAPS
100.0000 mg | ORAL_CAPSULE | Freq: Every day | ORAL | Status: DC
  Administered 2019-10-18 – 2019-10-20 (×3): 100 mg via ORAL
  Filled 2019-10-17 (×3): qty 1

## 2019-10-17 MED ORDER — ENOXAPARIN SODIUM 40 MG/0.4ML ~~LOC~~ SOLN
40.0000 mg | Freq: Every day | SUBCUTANEOUS | Status: DC
  Administered 2019-10-18 – 2019-10-19 (×3): 40 mg via SUBCUTANEOUS
  Filled 2019-10-17 (×3): qty 0.4

## 2019-10-17 MED ORDER — POTASSIUM CHLORIDE 10 MEQ/100ML IV SOLN
10.0000 meq | Freq: Once | INTRAVENOUS | Status: AC
  Administered 2019-10-17: 10 meq via INTRAVENOUS
  Filled 2019-10-17: qty 100

## 2019-10-17 MED ORDER — LORAZEPAM 2 MG/ML IJ SOLN: 1.0000 mg | INTRAMUSCULAR | Status: DC | PRN

## 2019-10-17 MED ORDER — POTASSIUM CHLORIDE CRYS ER 20 MEQ PO TBCR
40.0000 meq | EXTENDED_RELEASE_TABLET | Freq: Once | ORAL | Status: AC
  Administered 2019-10-17: 40 meq via ORAL
  Filled 2019-10-17: qty 2

## 2019-10-17 MED ORDER — HYDROXYZINE HCL 10 MG PO TABS
10.0000 mg | ORAL_TABLET | Freq: Every day | ORAL | Status: DC
  Administered 2019-10-18 – 2019-10-20 (×3): 10 mg via ORAL
  Filled 2019-10-17 (×5): qty 1

## 2019-10-17 MED ORDER — ACETAMINOPHEN 325 MG PO TABS
650.0000 mg | ORAL_TABLET | Freq: Four times a day (QID) | ORAL | Status: DC | PRN
  Filled 2019-10-17: qty 2

## 2019-10-17 MED ORDER — LACTATED RINGERS IV SOLN: INTRAVENOUS | Status: DC

## 2019-10-17 MED ORDER — ALBUTEROL SULFATE HFA 108 (90 BASE) MCG/ACT IN AERS: 1.0000 | INHALATION_SPRAY | Freq: Four times a day (QID) | RESPIRATORY_TRACT | Status: DC | PRN

## 2019-10-17 MED ORDER — MOMETASONE FURO-FORMOTEROL FUM 200-5 MCG/ACT IN AERO
2.0000 | INHALATION_SPRAY | Freq: Two times a day (BID) | RESPIRATORY_TRACT | Status: DC
  Administered 2019-10-18 – 2019-10-20 (×4): 2 via RESPIRATORY_TRACT
  Filled 2019-10-17: qty 8.8

## 2019-10-17 MED ORDER — CYCLOBENZAPRINE HCL 10 MG PO TABS
5.0000 mg | ORAL_TABLET | Freq: Two times a day (BID) | ORAL | Status: DC | PRN
  Administered 2019-10-19 – 2019-10-20 (×2): 5 mg via ORAL
  Filled 2019-10-17 (×2): qty 1

## 2019-10-17 MED ORDER — FENTANYL CITRATE (PF) 100 MCG/2ML IJ SOLN
25.0000 ug | INTRAMUSCULAR | Status: DC | PRN
  Administered 2019-10-17 – 2019-10-19 (×18): 25 ug via INTRAVENOUS
  Filled 2019-10-17 (×19): qty 2

## 2019-10-17 MED ORDER — ONDANSETRON HCL 4 MG/2ML IJ SOLN
4.0000 mg | Freq: Once | INTRAMUSCULAR | Status: AC
  Administered 2019-10-17: 4 mg via INTRAVENOUS
  Filled 2019-10-17: qty 2

## 2019-10-17 MED ORDER — CHLORHEXIDINE GLUCONATE CLOTH 2 % EX PADS: 6.0000 | MEDICATED_PAD | Freq: Once | CUTANEOUS | Status: DC

## 2019-10-17 MED ORDER — ADULT MULTIVITAMIN W/MINERALS CH
1.0000 | ORAL_TABLET | Freq: Every day | ORAL | Status: DC
  Filled 2019-10-17: qty 1

## 2019-10-17 MED ORDER — MAGNESIUM SULFATE 2 GM/50ML IV SOLN
2.0000 g | Freq: Once | INTRAVENOUS | Status: AC
  Administered 2019-10-17: 2 g via INTRAVENOUS
  Filled 2019-10-17: qty 50

## 2019-10-17 MED ORDER — PANTOPRAZOLE SODIUM 40 MG PO TBEC
40.0000 mg | DELAYED_RELEASE_TABLET | Freq: Every day | ORAL | Status: DC
  Administered 2019-10-18 – 2019-10-20 (×3): 40 mg via ORAL
  Filled 2019-10-17 (×3): qty 1

## 2019-10-17 MED ORDER — SODIUM CHLORIDE 0.9 % IV BOLUS
1000.0000 mL | Freq: Once | INTRAVENOUS | Status: AC
  Administered 2019-10-17: 1000 mL via INTRAVENOUS

## 2019-10-17 MED ORDER — FENTANYL CITRATE (PF) 100 MCG/2ML IJ SOLN
25.0000 ug | Freq: Once | INTRAMUSCULAR | Status: AC
  Administered 2019-10-17: 25 ug via INTRAVENOUS
  Filled 2019-10-17: qty 2

## 2019-10-17 MED ORDER — ONDANSETRON HCL 4 MG PO TABS
4.0000 mg | ORAL_TABLET | Freq: Four times a day (QID) | ORAL | Status: DC | PRN
  Filled 2019-10-17: qty 1

## 2019-10-17 MED ORDER — ONDANSETRON HCL 4 MG/2ML IJ SOLN
4.0000 mg | Freq: Four times a day (QID) | INTRAMUSCULAR | Status: DC | PRN
  Administered 2019-10-18 (×3): 4 mg via INTRAVENOUS
  Filled 2019-10-17 (×4): qty 2

## 2019-10-17 MED ORDER — ALBUTEROL SULFATE (2.5 MG/3ML) 0.083% IN NEBU: 2.5000 mg | INHALATION_SOLUTION | Freq: Four times a day (QID) | RESPIRATORY_TRACT | Status: DC | PRN

## 2019-10-17 MED ORDER — LORAZEPAM 1 MG PO TABS
1.0000 mg | ORAL_TABLET | ORAL | Status: DC | PRN
  Administered 2019-10-18: 1 mg via ORAL
  Filled 2019-10-17: qty 1

## 2019-10-17 MED ORDER — LABETALOL HCL 200 MG PO TABS
200.0000 mg | ORAL_TABLET | Freq: Two times a day (BID) | ORAL | Status: DC
  Administered 2019-10-18 – 2019-10-20 (×6): 200 mg via ORAL
  Filled 2019-10-17 (×6): qty 1

## 2019-10-17 NOTE — ED Provider Notes (Signed)
[facility] Provider Note   CSN: [id number] Arrival date & time: 10/17/19  1537     History Chief Complaint  Patient presents with  . Emesis    Colleen Lee is a 64 y.o. female.  HPI      Colleen Lee is a 65 y.o. female with past medical history of cyclical vomiting, chronic abdominal pain, cirrhosis, hypertension and [name] . She presents to the Emergency Department complaining of gradually worsening abdominal pain, nausea, and vomiting and generalized weakness.  Symptoms began several days ago.  She states that she got her first COVID-19 vaccine last week and noticed that her symptoms began several days afterwards.  She believes that her symptoms may be related to the vaccine.  She also states that she feels dehydrated.  She complains of diffuse crampy abdominal pains of her upper abdomen.  She states that she is scheduled for any upper endoscopy next month.  She takes Protonix daily, but states this is not helping control her pain.  She also endorses having diarrhea at onset, but states the diarrhea has since resolved.  She also endorses intermittent episodes of sweating that occurs mostly at nighttime and wakes her from sleep.  No unexpected weight loss.  She denies chest pain, shortness of breath, fever or chills, dysuria, dark or tarry stools or hematemesis.   Past Medical History:  Diagnosis Date  . Asthma   . Chronic abdominal pain   . Chronic back pain   . Chronic constipation   . Cirrhosis ([place])    Metavir score F4 on elastography 2015  . Cyclical vomiting   . Fibroids   . Fibromyalgia   . GERD (gastroesophageal reflux disease)   . H. pylori infection 2014   treated with pylera, had to be treated again as it was not eradicated. Urea breath test then negative after subsequent treatment.   . [name]    HCV RNA positive 09/2012  . Hypertension   . Marijuana use   . Nausea and vomiting    chronic, recurrent  . PONV (postoperative  nausea and vomiting)    pt thinks maybe once she had N&V  . Sciatica of left side     Patient Active Problem List   Diagnosis Date Noted  . S/P right rotator cuff repair 01/30/19 02/06/2019  . Nontraumatic complete tear of right rotator cuff   . Arthritis of right acromioclavicular joint   . S/P total knee replacement, 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/24/2018  . Shoulder impingement, right 05/24/2018  . Dehydration   . Intractable cyclical vomiting 25/36/6440  . Pyloric stenosis in adult   . Thrombocytopenia ([place]) 12/20/2017  . Leukocytosis   . Malnutrition of moderate degree 12/17/2017  . Acute renal failure (ARF) ([place]) 12/15/2017  . Chronic abdominal pain 12/15/2017  . Gastroenteritis 06/14/2016  . Nausea with vomiting 06/10/2016  . Uterine enlargement 06/09/2016  . Intractable nausea and vomiting 06/08/2016  . Acute infective gastroenteritis 06/08/2016  . Diarrhea 06/08/2016  . Essential hypertension 06/08/2016  . GERD (gastroesophageal reflux disease) 06/08/2016  . Abdominal pain   . Endometrial polyp 04/15/2016  . PMB (postmenopausal bleeding) 02/27/2016  . Alcoholic cirrhosis of liver without ascites ([place])   . Asthma 01/21/2016  . Hepatic cirrhosis ([place]) 09/22/2015  . Arthritis of knee, degenerative 02/05/2015  . History of Helicobacter pylori infection 10/30/2014  . Chronic [name] with cirrhosis ([place]) 04/11/2014  . De Quervain's disease (radial styloid tenosynovitis) 12/25/2013  . Anorexia 11/21/2012  .  FH: colon cancer 11/21/2012  . Early satiety 10/25/2012  . Bowel habit changes 10/25/2012  . Abdominal pain, epigastric 10/25/2012  . Abdominal bloating 10/25/2012  . Constipation 10/25/2012  . Abnormal weight loss 10/25/2012  . Chronic viral [name] ([place]) 10/25/2012  . Radicular pain of left lower extremity 09/28/2012  . Back pain 09/28/2012  . Sciatica 08/10/2011  . S/P arthroscopy of left knee 08/10/2011  . Tibial plateau fracture 08/10/2011  . Pain in joint, lower  leg 02/12/2011  . Stiffness of joint, not elsewhere classified, lower leg 02/12/2011  . 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 Meniscus, medial, derangement 12/29/2010  . CLOSED FRACTURE OF UPPER END OF TIBIA 08/12/2010    Past Surgical History:  Procedure Laterality Date  . BALLOON DILATION  12/20/2017   Procedure: BALLOON DILATION;  Surgeon: Danie Binder, MD;  [facility];  Service: Endoscopy;;  pyloric dilation  . BIOPSY  12/20/2017   Procedure: BIOPSY;  Surgeon: Danie Binder, MD;  [facility];  Service: Endoscopy;;  duodenal and gastric biopsy  . COLONOSCOPY  01/18/2008   ERX:[id number] rectum.  Long redundant colon, a diminutive sigmoid polyp status post cold biopsy removed. Hyperplastic polyp. Repeat colonoscopy June 2014 due to family history of colon cancer  . COLONOSCOPY WITH ESOPHAGOGASTRODUODENOSCOPY (EGD) N/A 11/02/2012   PYP:[id number] gastric mucosa of doubtful, +H.pylori. Incomplete colonoscopy due to patient unable to tolerate exam, proximal colon seen. Patient refused ACBE.  Marland Kitchen COLONOSCOPY WITH PROPOFOL N/A 03/08/2013   Dr. Gala Romney: colonic polyp-removed as scribed above. Internal Hemorrhoids. Pathology did not reveal any colonic tissue, only mucus. SURVEILLANCE DUE Aug 2019  . COLONOSCOPY WITH PROPOFOL N/A 01/19/2018   Dr. Gala Romney: 6 mm cecal inflammatoy polyp, otherwise normal., Surveilance in 5 year s  . ESOPHAGEAL DILATION  12/20/2017   Procedure: ESOPHAGEAL DILATION;  Surgeon: Danie Binder, MD;  [facility];  Service: Endoscopy;;  . ESOPHAGOGASTRODUODENOSCOPY  01/18/2008   RMR: Normal esophagus, normal  stomach  . ESOPHAGOGASTRODUODENOSCOPY (EGD) WITH PROPOFOL N/A 02/09/2016   Normal esophagus, small hiatal hernia, portal hypertensive gastropathy, normal second portion of duodenum. Due in July 2019  . ESOPHAGOGASTRODUODENOSCOPY (EGD) WITH PROPOFOL N/A 12/20/2017   Dr. Oneida Alar: benign appearing esophageal stenosis s/p dilation, mild pyloric  stenosis s/p biopsy and dilation, mild duodenitis.   Marland Kitchen HYSTEROSCOPY  05/11/2016   Procedure: HYSTEROSCOPY;  Surgeon: Jonnie Kind, MD;  Location: [facility];  Service: Gynecology;;  . KNEE SURGERY     left knee  . MULTIPLE EXTRACTIONS WITH ALVEOLOPLASTY N/A 10/15/2013   Procedure: MULTIPLE EXTRACTION WITH ALVEOLOPLASTY;  Surgeon: Gae Bon, DDS;  Location: [facility];  Service: Oral Surgery;  Laterality: N/A;  . POLYPECTOMY N/A 03/08/2013   Procedure: POLYPECTOMY;  Surgeon: Daneil Dolin, MD;  Location: [facility];  Service: Endoscopy;  Laterality: N/A;  . POLYPECTOMY N/A 05/11/2016   Procedure: REMOVAL OF ENDOMETRIAL POLYP;  Surgeon: Jonnie Kind, MD;  Location: [facility];  Service: Gynecology;  Laterality: N/A;  . RESECTION DISTAL CLAVICAL Right 01/30/2019   Procedure: RESECTION DISTAL CLAVICAL;  Surgeon: Carole Civil, MD;  Location: [facility];  Service: Orthopedics;  Laterality: Right;  . SHOULDER OPEN ROTATOR CUFF REPAIR Right 01/30/2019   Procedure: ROTATOR CUFF REPAIR SHOULDER OPEN;  Surgeon: Carole Civil, MD;  Location: [facility];  Service: Orthopedics;  Laterality: Right;  pt to arrive at 7:30 for PICC at 8:00  . TOTAL KNEE ARTHROPLASTY Left 02/05/2015   Procedure: LEFT TOTAL KNEE ARTHROPLASTY;  Surgeon: Carole Civil, MD;  Location: [facility];  Service: Orthopedics;  Laterality: Left;     OB History    Gravida      Para      Term      Preterm      AB      Living  1     SAB      TAB      Ectopic      Multiple      Live Births              Family History  Problem Relation Age of Onset  . Colon cancer Brother 38          . Multiple myeloma Brother   . Liver cancer Sister   . Prostate cancer Brother   . Pancreatic cancer Brother   . Cancer Mother        breast  . Asthma Mother     Social History   Tobacco Use  . Smoking status: Current Every Day Smoker    Packs/day: 1.00    Years: 40.00    Pack years: 40.00    Types: Cigarettes  . Smokeless  tobacco: Never Used  . Tobacco comment: Smokes one pack of cigarettes daily  Substance Use Topics  . Alcohol use: Not Currently    Alcohol/week: 1.0 standard drinks    Types: 1 Cans of beer per week    Comment: occas; denied 03/01/19  . Drug use: Not Currently    Types: Marijuana    Comment: history of marijuana in past- almost 2 years ago    Home Medications Prior to Admission medications   Medication Sig Start Date End Date Taking? Authorizing Provider  albuterol (PROVENTIL HFA;VENTOLIN HFA) 108 (90 Base) MCG/ACT inhaler Inhale 1-2 puffs into the lungs every 6 (six) hours as needed for wheezing or shortness of breath.     [provider]  budesonide-formoterol (SYMBICORT) 160-4.5 MCG/ACT inhaler Inhale 2 puffs into the lungs daily.     [provider]  Cyanocobalamin (VITAMIN B 12 PO) Take 1 tablet by mouth daily.    [provider]  cyclobenzaprine (FLEXERIL) 5 MG tablet Take 1 tablet (5 mg total) by mouth 3 (three) times daily as needed for muscle spasms. 09/19/19   Carole Civil, MD  gabapentin (NEURONTIN) 100 MG capsule Take 1 capsule (100 mg total) by mouth 3 (three) times daily. Patient taking differently: Take 100 mg by mouth daily.  12/22/18   Carole Civil, MD  hydrALAZINE (APRESOLINE) 50 MG tablet Take 1 tablet (50 mg total) by mouth every 8 (eight) hours. 01/30/18   Barton Dubois, MD  hydrochlorothiazide (HYDRODIURIL) 12.5 MG tablet Take 12.5 mg by mouth daily.  09/12/18   [provider]  HYDROcodone-acetaminophen (NORCO/VICODIN) 5-325 MG tablet Take 1 tablet by mouth every 8 (eight) hours as needed for up to 7 days for moderate pain. TAKE 1 TABLET BY MOUTH EVERY 6 HOURS AS NEEDED FOR UP TO 7 DAYS FOR MODERATE PAIN. 10/16/19 10/23/19  Carole Civil, MD  hydrOXYzine (ATARAX/VISTARIL) 10 MG tablet Take 10 mg by mouth daily.  11/30/17   [provider]  ibuprofen (ADVIL) 800 MG tablet Take 1 tablet (800 mg total) by mouth every  8 (eight) hours as needed. 09/19/19   Carole Civil, MD  labetalol (NORMODYNE) 100 MG tablet Take 2 tablets (200 mg total) by mouth 2 (two) times daily. 01/30/18   Barton Dubois, MD  losartan (  COZAAR) 100 MG tablet Take 1 tablet (100 mg total) by mouth daily. 01/31/18   Barton Dubois, MD  megestrol (MEGACE) 40 MG tablet Take 40 mg by mouth daily.    [provider]  ondansetron (ZOFRAN ODT) 4 MG disintegrating tablet Take 1 tablet (4 mg total) by mouth 3 (three) times daily. '4mg'$  ODT q4 hours prn nausea/vomit Patient taking differently: Take 4 mg by mouth as needed for nausea or vomiting.  12/22/17   Kathie Dike, MD  pantoprazole (PROTONIX) 40 MG tablet Take 1 tablet (40 mg total) by mouth daily. 30 minutes before breakfast 09/07/19   Annitta Needs, NP  Plecanatide (TRULANCE) 3 MG TABS Take 3 mg by mouth daily. 09/07/19   Annitta Needs, NP    Allergies    Penicillins  Review of Systems   Review of Systems  Constitutional: Positive for appetite change and diaphoresis. Negative for chills, fever and unexpected weight change.  HENT: Negative for trouble swallowing.   Respiratory: Negative for cough and shortness of breath.   Cardiovascular: Negative for chest pain and leg swelling.  Gastrointestinal: Positive for abdominal pain, nausea and vomiting. Negative for blood in stool and constipation.  Genitourinary: Negative for difficulty urinating, dysuria, flank pain and frequency.  Musculoskeletal: Negative for arthralgias and back pain.  Skin: Negative for color change and rash.  Neurological: Negative for dizziness, syncope, weakness, numbness and headaches.  Psychiatric/Behavioral: Negative for confusion.    Physical Exam Updated Vital Signs BP 98/71   Pulse (!) 116   Temp (!) 97.4 F (36.3 C) (Oral)   Resp 20   Ht '5\' 6"'$  (1.676 m)   Wt 83.9 kg   SpO2 100%   BMI 29.86 kg/m   Physical Exam Vitals and nursing note reviewed.  Constitutional:      General: She is not in  acute distress.    Appearance: Normal appearance. She is not ill-appearing or toxic-appearing.  HENT:     Mouth/Throat:     Mouth: Mucous membranes are dry.  Cardiovascular:     Rate and Rhythm: Normal rate and regular rhythm.     Pulses: Normal pulses.  Pulmonary:     Effort: Pulmonary effort is normal. No respiratory distress.     Breath sounds: Normal breath sounds.  Chest:     Chest wall: No tenderness.  Abdominal:     Palpations: Abdomen is soft. There is no mass.     Tenderness: There is abdominal tenderness. There is no right CVA tenderness, left CVA tenderness or guarding. Negative signs include McBurney's sign.     Comments: Diffuse tenderness to palpation of the mid to upper abdomen.  No guarding or rebound.  Abdomen is soft.  No peritoneal signs.  Musculoskeletal:        General: No swelling. Normal range of motion.     Cervical back: Normal range of motion. No tenderness.  Skin:    General: Skin is warm.     Capillary Refill: Capillary refill takes less than 2 seconds.     Findings: No erythema or rash.  Neurological:     General: No focal deficit present.     Mental Status: She is alert.     Sensory: No sensory deficit.     Motor: No weakness.     ED Results / Procedures / Treatments   Labs (all labs ordered are listed, but only abnormal results are displayed) Labs Reviewed  COMPREHENSIVE METABOLIC PANEL - Abnormal; Notable for the following components:  Result Value   Sodium 134 (*)    Potassium 2.8 (*)    Glucose, Bld 146 (*)    Creatinine, Ser 2.07 (*)    Calcium 10.7 (*)    Total Protein 9.3 (*)    AST 68 (*)    ALT 50 (*)    Total Bilirubin 2.1 (*)    GFR calc non Af Amer 25 (*)    GFR calc Af Amer 29 (*)    All other components within normal limits  LIPASE, BLOOD - Abnormal; Notable for the following components:   Lipase 187 (*)    All other components within normal limits  CBC WITH DIFFERENTIAL/PLATELET - Abnormal; Notable for the following  components:   Hemoglobin 15.2 (*)    Neutro Abs 7.8 (*)    All other components within normal limits  SARS CORONAVIRUS 2 (TAT 6-24 HRS)  URINALYSIS, ROUTINE W REFLEX MICROSCOPIC  COMPREHENSIVE METABOLIC PANEL    EKG EKG Interpretation  Date/Time:  Wednesday October 17 2019 18:27:03 EDT Ventricular Rate:  92 PR Interval:    QRS Duration: 89 QT Interval:  363 QTC Calculation: 449 R Axis:   76 Text Interpretation: Sinus rhythm Since last tracing rate faster Confirmed by Noemi Chapel [phone]) on 10/17/2019 6:47:08 PM   Radiology CT ABDOMEN PELVIS WO CONTRAST  Result Date: 10/17/2019 CLINICAL DATA:  Nausea and vomiting with upper abdominal pain. EXAM: CT ABDOMEN AND PELVIS WITHOUT CONTRAST TECHNIQUE: Multidetector CT imaging of the abdomen and pelvis was performed following the standard protocol without IV contrast. COMPARISON:  October 07, 2016 FINDINGS: Lower chest: The lung bases are clear. The heart size is normal. Hepatobiliary: There is decreased hepatic attenuation suggestive of hepatic steatosis. Normal gallbladder.There is no biliary ductal dilation. Pancreas: There is mild haziness about the pancreatic head and distal pancreatic body. Spleen: No splenic laceration or hematoma. Adrenals/Urinary Tract: --Adrenal glands: No adrenal hemorrhage. --Right kidney/ureter: No hydronephrosis or perinephric hematoma. --Left kidney/ureter: No hydronephrosis or perinephric hematoma. --Urinary bladder: Unremarkable. Stomach/Bowel: --Stomach/Duodenum: No hiatal hernia or other gastric abnormality. Normal duodenal course and caliber. --Small bowel: No dilatation or inflammation. --Colon: No focal abnormality. --Appendix: Normal. Vascular/Lymphatic: Atherosclerotic calcification is present within the non-aneurysmal abdominal aorta, without hemodynamically significant stenosis. --No retroperitoneal lymphadenopathy. --No mesenteric lymphadenopathy. --No pelvic or inguinal lymphadenopathy. Reproductive: Fibroids  are noted within the uterus. There is no evidence for a significant ovarian mass. Other: No ascites or free air. The abdominal wall is normal. Musculoskeletal. No acute displaced fractures. IMPRESSION: 1. There is mild haziness about the pancreatic head and distal pancreatic body, which may represent early acute pancreatitis. Correlate with exam and laboratory values. 2. Hepatic steatosis. 3. Aortic Atherosclerosis (ICD10-I70.0). Electronically Signed   By: Constance Holster M.D.   On: 10/17/2019 20:04    Procedures Procedures (including critical care time)  Medications Ordered in ED Medications  potassium chloride SA (KLOR-CON) CR tablet 40 mEq (has no administration in time range)  sodium chloride 0.9 % bolus 1,000 mL (0 mLs Intravenous Stopped 10/17/19 2104)  fentaNYL (SUBLIMAZE) injection 25 mcg (25 mcg Intravenous Given 10/17/19 1701)  potassium chloride 10 mEq in 100 mL IVPB (0 mEq Intravenous Stopped 10/17/19 1947)  fentaNYL (SUBLIMAZE) injection 25 mcg (25 mcg Intravenous Given 10/17/19 1841)  ondansetron (ZOFRAN) injection 4 mg (4 mg Intravenous Given 10/17/19 1839)  magnesium sulfate IVPB 2 g 50 mL (0 g Intravenous Stopped 10/17/19 2104)    ED Course  I have reviewed the triage vital signs and the nursing notes.  Pertinent labs & imaging results that were available during my care of the patient were reviewed by me and considered in my medical decision making (see chart for details).    MDM Rules/Calculators/A&P                      Patient here with upper abdominal pain, nausea, vomiting for several days.  Laboratory studies show likely AKI, hypokalemia and elevated lipase and bilirubin with CT of the abdomen and pelvis showing likely acute pancreatitis no evidence of stones.  She has been given IV fluids, pain medication, potassium, magnesium and antiemetic.  She continues to have some mild diffuse upper abdominal pain but generally reports feeling better after treatment.  No further  vomiting.  She will likely need admission and likely consultation with GI.   Consulted hospitalist and discussed findings, agrees to admission.  Final Clinical Impression(s) / ED Diagnoses Final diagnoses:  Acute pancreatitis without infection or necrosis, unspecified pancreatitis type  Hypokalemia  AKI (acute kidney injury) [facility])    Rx / [place] Orders ED Discharge Orders    None       Bufford Lope 10/17/19 2207    Noemi Chapel, MD 10/18/19 2247

## 2019-10-17 NOTE — ED Provider Notes (Signed)
Medical screening examination/treatment/procedure(s) were conducted as a shared visit with non-physician practitioner(s) and myself.  I personally evaluated the patient during the encounter.  Clinical Impression:   Final diagnoses:  Acute pancreatitis without infection or necrosis, unspecified pancreatitis type  Hypokalemia  AKI (acute kidney injury) [facility])   Patient is a 64 year old female, presenting with upper abdominal discomfort, on exam the patient is obese and has significant tenderness across the upper abdomen with mild guarding, no tenderness on the lower abdomen, she is mildly tachycardic.  Labs reviewed, shows that she has an acute kidney injury, has a slight elevation of bilirubin and a pancreatitis type picture with lipase that is elevated.  CT scan confirms some peripancreatic inflammation.  She will need to be admitted due to the level of her pain and tachycardia.   Noemi Chapel, MD 10/18/19 [phone]

## 2019-10-17 NOTE — H&P (Signed)
History and Physical    BERTHAMAE KONDO Lee:[id number] DOB: [birth year] DOA: 10/17/2019  PCP: Avon Gully, MD  Patient coming from: Home  I have personally briefly reviewed patient's old medical records in [facility]  Chief Complaint: Abd pain, vomiting  HPI: Colleen Lee is a 64 y.o. 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  Pt presents to the ED with epigastric abd pain, N/V.  Symptoms onset on Sat, last drink of EtOH was also on Sat.  Symptoms persistent since onset.  Pt feels dehydrated.  Diffuse crampy abd pain of upper abdomen.  Protonix not helping.  Had diarrhea at onset, since resolved.   ED Course: AKI with creat 2.0 up from 1.0 in Feb.  Lipase 187, AST 68, ALT 50.  CT abd/pelvis confirms acute pancreatitis.   Review of Systems: As per HPI, otherwise all review of systems negative.  Past Medical History:  Diagnosis Date  . Asthma   . Chronic abdominal pain   . Chronic back pain   . Chronic constipation   . Cirrhosis (HCC)    Metavir score F4 on elastography 2015  . Cyclical vomiting   . Fibroids   . Fibromyalgia   . GERD (gastroesophageal reflux disease)   . H. pylori infection 2014   treated with pylera, had to be treated again as it was not eradicated. Urea breath test then negative after subsequent treatment.   . [name]    HCV RNA positive 09/2012  . Hypertension   . Marijuana use   . Nausea and vomiting    chronic, recurrent  . PONV (postoperative nausea and vomiting)    pt thinks maybe once she had N&V  . Sciatica of left side     Past Surgical History:  Procedure Laterality Date  . BALLOON DILATION  12/20/2017   Procedure: BALLOON DILATION;  Surgeon: West Bali, MD;  [facility];  Service: Endoscopy;;  pyloric dilation  . BIOPSY  12/20/2017   Procedure: BIOPSY;  Surgeon: West Bali, MD;  [facility];  Service: Endoscopy;;   duodenal and gastric biopsy  . COLONOSCOPY  01/18/2008   GMW:NUUVOZ rectum.  Long redundant colon, a diminutive sigmoid polyp status post cold biopsy removed. Hyperplastic polyp. Repeat colonoscopy June 2014 due to family history of colon cancer  . COLONOSCOPY WITH ESOPHAGOGASTRODUODENOSCOPY (EGD) N/A 11/02/2012   DGU:[id number] gastric mucosa of doubtful, +H.pylori. Incomplete colonoscopy due to patient unable to tolerate exam, proximal colon seen. Patient refused ACBE.  Marland Kitchen COLONOSCOPY WITH PROPOFOL N/A 03/08/2013   Dr. Jena Gauss: colonic polyp-removed as scribed above. Internal Hemorrhoids. Pathology did not reveal any colonic tissue, only mucus. SURVEILLANCE DUE Aug 2019  . COLONOSCOPY WITH PROPOFOL N/A 01/19/2018   Dr. Jena Gauss: 6 mm cecal inflammatoy polyp, otherwise normal., Surveilance in 5 year s  . ESOPHAGEAL DILATION  12/20/2017   Procedure: ESOPHAGEAL DILATION;  Surgeon: West Bali, MD;  [facility];  Service: Endoscopy;;  . ESOPHAGOGASTRODUODENOSCOPY  01/18/2008   RMR: Normal esophagus, normal  stomach  . ESOPHAGOGASTRODUODENOSCOPY (EGD) WITH PROPOFOL N/A 02/09/2016   Normal esophagus, small hiatal hernia, portal hypertensive gastropathy, normal second portion of duodenum. Due in July 2019  . ESOPHAGOGASTRODUODENOSCOPY (EGD) WITH PROPOFOL N/A 12/20/2017   Dr. Darrick Penna: benign appearing esophageal stenosis s/p dilation, mild pyloric stenosis s/p biopsy and dilation, mild duodenitis.   Marland Kitchen HYSTEROSCOPY  05/11/2016   Procedure: HYSTEROSCOPY;  Surgeon: Jonny Ruiz  Benancio Deeds, MD;  Location: [facility];  Service: Gynecology;;  . KNEE SURGERY     left knee  . MULTIPLE EXTRACTIONS WITH ALVEOLOPLASTY N/A 10/15/2013   Procedure: MULTIPLE EXTRACTION WITH ALVEOLOPLASTY;  Surgeon: Georgia Lopes, DDS;  Location: [facility];  Service: Oral Surgery;  Laterality: N/A;  . POLYPECTOMY N/A 03/08/2013   Procedure: POLYPECTOMY;  Surgeon: Corbin Ade, MD;  Location: [facility];  Service: Endoscopy;  Laterality: N/A;  .  POLYPECTOMY N/A 05/11/2016   Procedure: REMOVAL OF ENDOMETRIAL POLYP;  Surgeon: Tilda Burrow, MD;  Location: [facility];  Service: Gynecology;  Laterality: N/A;  . RESECTION DISTAL CLAVICAL Right 01/30/2019   Procedure: RESECTION DISTAL CLAVICAL;  Surgeon: Vickki Hearing, MD;  Location: [facility];  Service: Orthopedics;  Laterality: Right;  . SHOULDER OPEN ROTATOR CUFF REPAIR Right 01/30/2019   Procedure: ROTATOR CUFF REPAIR SHOULDER OPEN;  Surgeon: Vickki Hearing, MD;  Location: [facility];  Service: Orthopedics;  Laterality: Right;  pt to arrive at 7:30 for PICC at 8:00  . TOTAL KNEE ARTHROPLASTY Left 02/05/2015   Procedure: LEFT TOTAL KNEE ARTHROPLASTY;  Surgeon: Vickki Hearing, MD;  Location: [facility];  Service: Orthopedics;  Laterality: Left;     reports that she has been smoking cigarettes. She has a 40.00 pack-year smoking history. She has never used smokeless tobacco. She reports previous alcohol use of about 1.0 standard drinks of alcohol per week. She reports previous drug use. Drug: Marijuana.  Allergies  Allergen Reactions  . Penicillins Rash    Did it involve swelling of the face/tongue/throat, SOB, or low BP? No Did it involve sudden or severe rash/hives, skin peeling, or any reaction on the inside of your mouth or nose? No Did you need to seek medical attention at a hospital or doctor's office? No When did it last happen?Many years ago If all above answers are "NO", may proceed with cephalosporin use.      Family History  Problem Relation Age of Onset  . Colon cancer Brother 94          . Multiple myeloma Brother   . Liver cancer Sister   . Prostate cancer Brother   . Pancreatic cancer Brother   . Cancer Mother        breast  . Asthma Mother      Prior to Admission medications   Medication Sig Start Date End Date Taking? Authorizing Provider  albuterol (PROVENTIL HFA;VENTOLIN HFA) 108 (90 Base) MCG/ACT inhaler Inhale 1-2 puffs into the lungs every 6 (six)  hours as needed for wheezing or shortness of breath.    Yes [provider]  budesonide-formoterol (SYMBICORT) 160-4.5 MCG/ACT inhaler Inhale 2 puffs into the lungs daily.    Yes [provider]  Cyanocobalamin (VITAMIN B 12 PO) Take 1 tablet by mouth daily.   Yes [provider]  cyclobenzaprine (FLEXERIL) 5 MG tablet Take 1 tablet (5 mg total) by mouth 3 (three) times daily as needed for muscle spasms. Patient taking differently: Take 5 mg by mouth 2 (two) times daily as needed for muscle spasms.  09/19/19  Yes Vickki Hearing, MD  gabapentin (NEURONTIN) 100 MG capsule Take 1 capsule (100 mg total) by mouth 3 (three) times daily. Patient taking differently: Take 100 mg by mouth daily.  12/22/18  Yes Vickki Hearing, MD  hydrALAZINE (APRESOLINE) 50 MG tablet Take 1 tablet (50 mg total) by mouth every 8 (eight) hours. 01/30/18  Yes Vassie Loll, MD  hydrochlorothiazide (  HYDRODIURIL) 12.5 MG tablet Take 12.5 mg by mouth daily.  09/12/18  Yes [provider]  HYDROcodone-acetaminophen (NORCO/VICODIN) 5-325 MG tablet Take 1 tablet by mouth every 8 (eight) hours as needed for up to 7 days for moderate pain. TAKE 1 TABLET BY MOUTH EVERY 6 HOURS AS NEEDED FOR UP TO 7 DAYS FOR MODERATE PAIN. 10/16/19 10/23/19 Yes Vickki Hearing, MD  hydrOXYzine (ATARAX/VISTARIL) 10 MG tablet Take 10 mg by mouth daily.  11/30/17  Yes [provider]  ibuprofen (ADVIL) 800 MG tablet Take 1 tablet (800 mg total) by mouth every 8 (eight) hours as needed. 09/19/19  Yes Vickki Hearing, MD  labetalol (NORMODYNE) 100 MG tablet Take 2 tablets (200 mg total) by mouth 2 (two) times daily. 01/30/18  Yes Vassie Loll, MD  losartan (COZAAR) 100 MG tablet Take 1 tablet (100 mg total) by mouth daily. 01/31/18  Yes Vassie Loll, MD  megestrol (MEGACE) 40 MG tablet Take 40 mg by mouth daily.   Yes [provider]  ondansetron (ZOFRAN ODT) 4 MG disintegrating tablet Take 1 tablet  (4 mg total) by mouth 3 (three) times daily. 4mg  ODT q4 hours prn nausea/vomit Patient taking differently: Take 4 mg by mouth as needed for nausea or vomiting.  12/22/17  Yes Erick Blinks, MD  pantoprazole (PROTONIX) 40 MG tablet Take 1 tablet (40 mg total) by mouth daily. 30 minutes before breakfast 09/07/19  Yes Gelene Mink, NP  Plecanatide (TRULANCE) 3 MG TABS Take 3 mg by mouth daily. 09/07/19  Yes Gelene Mink, NP  VIRTUSSIN A/C 100-10 MG/5ML syrup Take 10 mLs by mouth every 6 (six) hours as needed. 08/09/19  Yes [provider]    Physical Exam: Vitals:   10/17/19 1607 10/17/19 1612 10/17/19 1900  BP: 98/71  128/88  Pulse: (!) 116  84  Resp: 20  (!) 22  Temp:  (!) 97.4 F (36.3 C)   TempSrc:  Oral   SpO2: 100%  98%  Weight: 83.9 kg    Height: 5\' 6"  (1.676 m)      Constitutional: NAD, calm, comfortable Eyes: PERRL, lids and conjunctivae normal ENMT: Mucous membranes are moist. Posterior pharynx clear of any exudate or lesions.Normal dentition.  Neck: normal, supple, no masses, no thyromegaly Respiratory: clear to auscultation bilaterally, no wheezing, no crackles. Normal respiratory effort. No accessory muscle use.  Cardiovascular: Regular rate and rhythm, no murmurs / rubs / gallops. No extremity edema. 2+ pedal pulses. No carotid bruits.  Abdomen: no tenderness, no masses palpated. No hepatosplenomegaly. Bowel sounds positive.  Musculoskeletal: no clubbing / cyanosis. No joint deformity upper and lower extremities. Good ROM, no contractures. Normal muscle tone.  Skin: no rashes, lesions, ulcers. No induration Neurologic: CN 2-12 grossly intact. Sensation intact, DTR normal. Strength 5/5 in all 4.  Psychiatric: Normal judgment and insight. Alert and oriented x 3. Normal mood.    Labs on Admission: I have personally reviewed following labs and imaging studies  CBC: Recent Labs  Lab 10/17/19 1647  WBC 9.5  NEUTROABS 7.8*  HGB 15.2*  HCT 45.6  MCV 98.7  PLT 212    Basic Metabolic Panel: Recent Labs  Lab 10/17/19 1647  NA 134*  K 2.8*  CL 99  CO2 22  GLUCOSE 146*  BUN 14  CREATININE 2.07*  CALCIUM 10.7*   GFR: Estimated Creatinine Clearance: 30 mL/min (A) (by C-G formula based on SCr of 2.07 mg/dL (H)). Liver Function Tests: Recent Labs  Lab 10/17/19 1647  AST 68*  ALT 50*  ALKPHOS 74  BILITOT 2.1*  PROT 9.3*  ALBUMIN 4.2   Recent Labs  Lab 10/17/19 1647  LIPASE 187*   No results for input(s): AMMONIA in the last 168 hours. Coagulation Profile: No results for input(s): INR, PROTIME in the last 168 hours. Cardiac Enzymes: No results for input(s): CKTOTAL, CKMB, CKMBINDEX, TROPONINI in the last 168 hours. BNP (last 3 results) No results for input(s): PROBNP in the last 8760 hours. HbA1C: No results for input(s): HGBA1C in the last 72 hours. CBG: No results for input(s): GLUCAP in the last 168 hours. Lipid Profile: No results for input(s): CHOL, HDL, LDLCALC, TRIG, CHOLHDL, LDLDIRECT in the last 72 hours. Thyroid Function Tests: No results for input(s): TSH, T4TOTAL, FREET4, T3FREE, THYROIDAB in the last 72 hours. Anemia Panel: No results for input(s): VITAMINB12, FOLATE, FERRITIN, TIBC, IRON, RETICCTPCT in the last 72 hours. Urine analysis:    Component Value Date/Time   COLORURINE STRAW (A) 02/08/2018 1356   APPEARANCEUR CLEAR 02/08/2018 1356   LABSPEC 1.008 02/08/2018 1356   PHURINE 7.0 02/08/2018 1356   GLUCOSEU 150 (A) 02/08/2018 1356   HGBUR NEGATIVE 02/08/2018 1356   BILIRUBINUR NEGATIVE 02/08/2018 1356   KETONESUR NEGATIVE 02/08/2018 1356   PROTEINUR 30 (A) 02/08/2018 1356   UROBILINOGEN 0.2 04/01/2015 1140   NITRITE NEGATIVE 02/08/2018 1356   LEUKOCYTESUR NEGATIVE 02/08/2018 1356    Radiological Exams on Admission: CT ABDOMEN PELVIS WO CONTRAST  Result Date: 10/17/2019 CLINICAL DATA:  Nausea and vomiting with upper abdominal pain. EXAM: CT ABDOMEN AND PELVIS WITHOUT CONTRAST TECHNIQUE: Multidetector  CT imaging of the abdomen and pelvis was performed following the standard protocol without IV contrast. COMPARISON:  October 07, 2016 FINDINGS: Lower chest: The lung bases are clear. The heart size is normal. Hepatobiliary: There is decreased hepatic attenuation suggestive of hepatic steatosis. Normal gallbladder.There is no biliary ductal dilation. Pancreas: There is mild haziness about the pancreatic head and distal pancreatic body. Spleen: No splenic laceration or hematoma. Adrenals/Urinary Tract: --Adrenal glands: No adrenal hemorrhage. --Right kidney/ureter: No hydronephrosis or perinephric hematoma. --Left kidney/ureter: No hydronephrosis or perinephric hematoma. --Urinary bladder: Unremarkable. Stomach/Bowel: --Stomach/Duodenum: No hiatal hernia or other gastric abnormality. Normal duodenal course and caliber. --Small bowel: No dilatation or inflammation. --Colon: No focal abnormality. --Appendix: Normal. Vascular/Lymphatic: Atherosclerotic calcification is present within the non-aneurysmal abdominal aorta, without hemodynamically significant stenosis. --No retroperitoneal lymphadenopathy. --No mesenteric lymphadenopathy. --No pelvic or inguinal lymphadenopathy. Reproductive: Fibroids are noted within the uterus. There is no evidence for a significant ovarian mass. Other: No ascites or free air. The abdominal wall is normal. Musculoskeletal. No acute displaced fractures. IMPRESSION: 1. There is mild haziness about the pancreatic head and distal pancreatic body, which may represent early acute pancreatitis. Correlate with exam and laboratory values. 2. Hepatic steatosis. 3. Aortic Atherosclerosis (ICD10-I70.0). Electronically Signed   By: Katherine Mantle M.D.   On: 10/17/2019 20:04    EKG: Independently reviewed.  Assessment/Plan Principal Problem:   Acute pancreatitis Active Problems:   Chronic [name] with cirrhosis (HCC)   Alcoholic cirrhosis of liver without ascites (HCC)   Essential  hypertension   Acute renal failure (ARF) (HCC)   Chronic abdominal pain   ETOH abuse    1. Acute alcoholic pancreatitis - 1. NPO 2. IVF 3. Repeat CMP in AM 4. Fentanyl PRN pain 5. zofran PRN nausea 2. AKI - 1. Pre-renal due to dehydration from N/V above 3. EtOH abuse - 1. CIWA 4. Cirrhosis - 1. Chronic and  stable 2. Has follow up with GI, EGD scheduled for next month for chronic abd pain. 5. HTN - 1. Hold ACEi and diuretic 2. Cont other home BP meds  DVT prophylaxis: Lovenox Code Status: Full Family Communication: No family in room Disposition Plan: Home after AKI improved and can take POs Consults called: None Admission status: Admit to inpatient  Severity of Illness: The appropriate patient status for this patient is INPATIENT. Inpatient status is judged to be reasonable and necessary in order to provide the required intensity of service to ensure the patient's safety. The patient's presenting symptoms, physical exam findings, and initial radiographic and laboratory data in the context of their chronic comorbidities is felt to place them at high risk for further clinical deterioration. Furthermore, it is not anticipated that the patient will be medically stable for discharge from the hospital within 2 midnights of admission. The following factors support the patient status of inpatient.   IP status due to AKI with doubling of creatinine from baseline.  NPO status to treat pancreatitis (needs IVF).   * I certify that at the point of admission it is my clinical judgment that the patient will require inpatient hospital care spanning beyond 2 midnights from the point of admission due to high intensity of service, high risk for further deterioration and high frequency of surveillance required.*    Romen Yutzy M. DO [facility]  How to contact the [facility] Attending or Consulting provider 7A - 7P or covering provider during after hours 7P -7A, for this patient?   1. Check the care team in [facility] and look for a) attending/consulting [facility] provider listed and b) the [facility] team listed 2. Log into [email or web address]  Amion Physician Scheduling and messaging for groups and whole hospitals  On call and physician scheduling software for group practices, residents, hospitalists and other medical providers for call, clinic, rotation and shift schedules. On[employer] is a hospital-wide system for scheduling doctors and paging doctors on call. EasyPlot is for scientific plotting and data analysis.  [email or web address]  and use [facility]'s universal password to access. If you do not have the password, please contact the hospital operator.  3. Locate the [facility] provider you are looking for under [facility] and page to a number that you can be directly reached. 4. If you still have difficulty reaching the provider, please page the [facility] (Director on Call) for the Hospitalists listed on amion for assistance.  10/17/2019, 10:24 PM

## 2019-10-17 NOTE — Clinical Social Work Note (Signed)
TOC received consult for substance use counseling for patient regarding pancreatitis. Patient stated, "I don't drink every day. I had a beer a few days ago." Discussed patient's pancreatitis and the impact substance use can have on the body. Patient reported she has never used inpatient or outpatient substance use services, but was agreeable to looking at resources. CSW provided patient with substance use resources with contact information.  Tobi Bastos, LCSW Transitions of Care Clinical Social Worker [facility] Ph: [phone]

## 2019-10-17 NOTE — ED Triage Notes (Signed)
Pt c/o abdominal pain with n/v and states she has been feeling weak and will have episodes of diaphoresis; pt states she received her first covid vaccine a week and a half ago

## 2019-10-18 LAB — COMPREHENSIVE METABOLIC PANEL
ALT: 68 U/L — ABNORMAL HIGH (ref 0–44)
AST: 184 U/L — ABNORMAL HIGH (ref 15–41)
Albumin: 3.4 g/dL — ABNORMAL LOW (ref 3.5–5.0)
Alkaline Phosphatase: 76 U/L (ref 38–126)
Anion gap: 11 (ref 5–15)
BUN: 13 mg/dL (ref 8–23)
CO2: 18 mmol/L — ABNORMAL LOW (ref 22–32)
Calcium: 9.8 mg/dL (ref 8.9–10.3)
Chloride: 103 mmol/L (ref 98–111)
Creatinine, Ser: 1.14 mg/dL — ABNORMAL HIGH (ref 0.44–1.00)
GFR calc Af Amer: 59 mL/min — ABNORMAL LOW (ref >60)
GFR calc non Af Amer: 51 mL/min — ABNORMAL LOW (ref >60)
Glucose, Bld: 119 mg/dL — ABNORMAL HIGH (ref 70–99)
Potassium: 3.2 mmol/L — ABNORMAL LOW (ref 3.5–5.1)
Sodium: 132 mmol/L — ABNORMAL LOW (ref 135–145)
Total Bilirubin: 3.9 mg/dL — ABNORMAL HIGH (ref 0.3–1.2)
Total Protein: 7.3 g/dL (ref 6.5–8.1)

## 2019-10-18 LAB — URINALYSIS, ROUTINE W REFLEX MICROSCOPIC
Bilirubin Urine: NEGATIVE
Glucose, UA: NEGATIVE mg/dL
Hgb urine dipstick: NEGATIVE
Ketones, ur: NEGATIVE mg/dL
Leukocytes,Ua: NEGATIVE
Nitrite: NEGATIVE
Protein, ur: 30 mg/dL — AB
Specific Gravity, Urine: 1.014 (ref 1.005–1.030)
pH: 6 (ref 5.0–8.0)

## 2019-10-18 LAB — HIV ANTIBODY (ROUTINE TESTING W REFLEX): HIV Screen 4th Generation wRfx: NONREACTIVE

## 2019-10-18 MED ORDER — POTASSIUM CHLORIDE CRYS ER 20 MEQ PO TBCR
40.0000 meq | EXTENDED_RELEASE_TABLET | Freq: Once | ORAL | Status: AC
  Administered 2019-10-18: 40 meq via ORAL
  Filled 2019-10-18: qty 2

## 2019-10-18 MED ORDER — PROMETHAZINE HCL 25 MG/ML IJ SOLN: 6.2500 mg | Freq: Four times a day (QID) | INTRAMUSCULAR | Status: DC | PRN

## 2019-10-18 MED ORDER — PROMETHAZINE HCL 25 MG/ML IJ SOLN
12.5000 mg | Freq: Four times a day (QID) | INTRAMUSCULAR | Status: DC | PRN
  Administered 2019-10-18: 12.5 mg via INTRAVENOUS
  Filled 2019-10-18: qty 1

## 2019-10-18 MED ORDER — ADULT MULTIVITAMIN W/MINERALS CH
1.0000 | ORAL_TABLET | Freq: Every day | ORAL | Status: DC
  Administered 2019-10-18 – 2019-10-20 (×3): 1 via ORAL
  Filled 2019-10-18 (×2): qty 1

## 2019-10-18 MED ORDER — THIAMINE HCL 100 MG PO TABS
100.0000 mg | ORAL_TABLET | Freq: Every day | ORAL | Status: DC
  Administered 2019-10-18 – 2019-10-20 (×3): 100 mg via ORAL
  Filled 2019-10-18 (×2): qty 1

## 2019-10-18 MED ORDER — FOLIC ACID 1 MG PO TABS
1.0000 mg | ORAL_TABLET | Freq: Every day | ORAL | Status: DC
  Administered 2019-10-18 – 2019-10-20 (×3): 1 mg via ORAL
  Filled 2019-10-18 (×2): qty 1

## 2019-10-18 MED ORDER — THIAMINE HCL 100 MG/ML IJ SOLN: 100.0000 mg | Freq: Every day | INTRAMUSCULAR | Status: DC

## 2019-10-18 NOTE — Progress Notes (Signed)
Patient Demographics:    Colleen Lee, is a 64 y.o. female, DOB - 1956-04-13, [id number]  Admit date - 10/17/2019   Admitting Physician Hillary Bow, DO  Outpatient Primary MD for the patient is Avon Gully, MD  LOS - 1   Chief Complaint  Patient presents with   Emesis        Subjective:    Otto Herb today has no fevers,  ,  No chest pain,  --Nausea and abdominal pain not worse, patient willing to try liquid diet --Tremors noted in an alcoholic female  Assessment  & Plan :    Principal Problem:   Acute pancreatitis Active Problems:   Chronic [name] with cirrhosis (HCC)   Alcoholic cirrhosis of liver without ascites (HCC)   Essential hypertension   Acute renal failure (ARF) (HCC)   Chronic abdominal pain   ETOH abuse   1) acute alcoholic pancreatitis----sips and liquid diet as tolerated -Continue IV fluids, continue as needed antiemetics and opiates  2) history of alcohol abuse with risk for DTs--tremors noted, lorazepam per CIWA protocol along with multivitamin -Social worker and myself discussed resources for help with alcohol abuse with patient  3) alcoholic liver disease--- AST to ALT ratio consistent with alcoholic hepatitis-... Platelets are not low =-Continue to monitor LFTs especially given lack of acute ID finding  4)HTN--- continue to hold losartan and HCTZ due to dehydration and acute pancreatitis -Continue hydralazine and labetalol  5) COPD and tobacco abuse--- no acute exacerbation at this time patient already to quit smoking, continue bronchodilators  Disposition/Need for in-Hospital Stay- patient unable to be discharged at this time due to --acute pancreatitis requiring IV fluids and IV antiemetics and IV opiates pending improvement in abdominal pain and improvement in oral intake -Not medically ready for discharge at this time  Code Status : full    Family Communication:   NA (patient is alert, awake and coherent)  Consults  :  na  DVT Prophylaxis  :  Lovenox - - SCDs  Lab Results  Component Value Date   PLT 212 10/17/2019    Inpatient Medications  Scheduled Meds:  Chlorhexidine Gluconate Cloth  6 each Topical Once   And   Chlorhexidine Gluconate Cloth  6 each Topical Once   enoxaparin (LOVENOX) injection  40 mg Subcutaneous QHS   folic acid  1 mg Oral Daily   gabapentin  100 mg Oral Daily   hydrALAZINE  50 mg Oral Q8H   hydrOXYzine  10 mg Oral Daily   labetalol  200 mg Oral BID   mometasone-formoterol  2 puff Inhalation BID   multivitamin with minerals  1 tablet Oral Daily   pantoprazole  40 mg Oral Daily   potassium chloride  40 mEq Oral Once   thiamine  100 mg Intravenous Daily   Or   thiamine  100 mg Oral Daily   Continuous Infusions:  lactated ringers 125 mL/hr at 10/18/19 1553   PRN Meds:.acetaminophen **OR** acetaminophen, albuterol, cyclobenzaprine, fentaNYL (SUBLIMAZE) injection, LORazepam **OR** LORazepam, ondansetron **OR** ondansetron (ZOFRAN) IV, promethazine    Anti-infectives (From admission, onward)   None        Objective:   Vitals:   10/18/19 0855 10/18/19 1012 10/18/19 1328 10/18/19 1500  BP: (!) 144/80 131/84 (!) 143/82 (!) 149/74  Pulse: 87 80 88 84  Resp:   17   Temp:      TempSrc:      SpO2:   97%   Weight:      Height:        Wt Readings from Last 3 Encounters:  10/17/19 83.9 kg  09/19/19 85.3 kg  09/07/19 86.1 kg     Intake/Output Summary (Last 24 hours) at 10/18/2019 1843 Last data filed at 10/18/2019 1500 Gross per 24 hour  Intake 1823.07 ml  Output --  Net 1823.07 ml     Physical Exam  Gen:- Awake Alert,  In no apparent distress  HEENT:- [place].AT, No sclera icterus Neck-Supple Neck,No JVD,.  Lungs-  CTAB , fair symmetrical air movement CV- S1, S2 normal, regular  Abd-  +ve B.Sounds, Abd Soft, epigastric tenderness,    Extremity/Skin:- No   edema, pedal pulses present  Psych-affect is appropriate, oriented x3 Neuro-no new focal deficits, +ve tremors   Data Review:   Micro Results No results found for this or any previous visit (from the past 240 hour(s)).  Radiology Reports CT ABDOMEN PELVIS WO CONTRAST  Result Date: 10/17/2019 CLINICAL DATA:  Nausea and vomiting with upper abdominal pain. EXAM: CT ABDOMEN AND PELVIS WITHOUT CONTRAST TECHNIQUE: Multidetector CT imaging of the abdomen and pelvis was performed following the standard protocol without IV contrast. COMPARISON:  October 07, 2016 FINDINGS: Lower chest: The lung bases are clear. The heart size is normal. Hepatobiliary: There is decreased hepatic attenuation suggestive of hepatic steatosis. Normal gallbladder.There is no biliary ductal dilation. Pancreas: There is mild haziness about the pancreatic head and distal pancreatic body. Spleen: No splenic laceration or hematoma. Adrenals/Urinary Tract: --Adrenal glands: No adrenal hemorrhage. --Right kidney/ureter: No hydronephrosis or perinephric hematoma. --Left kidney/ureter: No hydronephrosis or perinephric hematoma. --Urinary bladder: Unremarkable. Stomach/Bowel: --Stomach/Duodenum: No hiatal hernia or other gastric abnormality. Normal duodenal course and caliber. --Small bowel: No dilatation or inflammation. --Colon: No focal abnormality. --Appendix: Normal. Vascular/Lymphatic: Atherosclerotic calcification is present within the non-aneurysmal abdominal aorta, without hemodynamically significant stenosis. --No retroperitoneal lymphadenopathy. --No mesenteric lymphadenopathy. --No pelvic or inguinal lymphadenopathy. Reproductive: Fibroids are noted within the uterus. There is no evidence for a significant ovarian mass. Other: No ascites or free air. The abdominal wall is normal. Musculoskeletal. No acute displaced fractures. IMPRESSION: 1. There is mild haziness about the pancreatic head and distal pancreatic body, which may represent  early acute pancreatitis. Correlate with exam and laboratory values. 2. Hepatic steatosis. 3. Aortic Atherosclerosis (ICD10-I70.0). Electronically Signed   By: Katherine Mantle M.D.   On: 10/17/2019 20:04   US Abdomen Complete  Result Date: 09/24/2019 CLINICAL DATA:  Cirrhosis. EXAM: ABDOMEN ULTRASOUND COMPLETE COMPARISON:  Abdominal ultrasound dated October 18, 2018. FINDINGS: Gallbladder: No gallstones or wall thickening visualized. No sonographic Murphy sign noted by sonographer. Common bile duct: Diameter: 9 mm, mildly dilated. Liver: No focal lesion identified. Coarsened echotexture with borderline increased parenchymal echogenicity. Portal vein is patent on color Doppler imaging with normal direction of blood flow towards the liver. IVC: No abnormality visualized. Pancreas: Visualized portion unremarkable. Spleen: Size and appearance within normal limits. Right Kidney: Length: 11.8 cm. Echogenicity within normal limits. No mass or hydronephrosis visualized. Left Kidney: Length: 11.1 cm. Echogenicity within normal limits. No mass or hydronephrosis visualized. Abdominal aorta: No aneurysm visualized. Other findings: None. IMPRESSION: 1. Stable sonographic appearance of the liver suggestive of underlying liver disease. No discrete lesion. 2. Increased mild common  bile duct dilatation measuring up to 9 mm. Correlate with LFTs and consider further evaluation with ERCP or MRCP as clinically indicated. Electronically Signed   By: Obie Dredge M.D.   On: 09/24/2019 15:35     CBC Recent Labs  Lab 10/17/19 1647  WBC 9.5  HGB 15.2*  HCT 45.6  PLT 212  MCV 98.7  MCH 32.9  MCHC 33.3  RDW 13.1  LYMPHSABS 1.1  MONOABS 0.4  EOSABS 0.1  BASOSABS 0.0    Chemistries  Recent Labs  Lab 10/17/19 1647 10/18/19 0604  NA 134* 132*  K 2.8* 3.2*  CL 99 103  CO2 22 18*  GLUCOSE 146* 119*  BUN 14 13  CREATININE 2.07* 1.14*  CALCIUM 10.7* 9.8  MG 2.4  --   AST 68* 184*  ALT 50* 68*  ALKPHOS 74 76   BILITOT 2.1* 3.9*   ------------------------------------------------------------------------------------------------------------------ No results for input(s): CHOL, HDL, LDLCALC, TRIG, CHOLHDL, LDLDIRECT in the last 72 hours.  Lab Results  Component Value Date   HGBA1C 5.4 12/18/2017   ------------------------------------------------------------------------------------------------------------------ No results for input(s): TSH, T4TOTAL, T3FREE, THYROIDAB in the last 72 hours.  Invalid input(s): FREET3 ------------------------------------------------------------------------------------------------------------------ No results for input(s): VITAMINB12, FOLATE, FERRITIN, TIBC, IRON, RETICCTPCT in the last 72 hours.  Coagulation profile No results for input(s): INR, PROTIME in the last 168 hours.  No results for input(s): DDIMER in the last 72 hours.  Cardiac Enzymes No results for input(s): CKMB, TROPONINI, MYOGLOBIN in the last 168 hours.  Invalid input(s): CK ------------------------------------------------------------------------------------------------------------------    Component Value Date/Time   BNP 11.0 10/11/2015 1141     Carita Sollars M.D on 10/18/2019 at 6:43 PM  Go to [email or web address] - for contact info  [facility] - Office  [phone]

## 2019-10-18 NOTE — Plan of Care (Signed)

## 2019-10-19 LAB — COMPREHENSIVE METABOLIC PANEL
ALT: 153 U/L — ABNORMAL HIGH (ref 0–44)
AST: 296 U/L — ABNORMAL HIGH (ref 15–41)
Albumin: 3 g/dL — ABNORMAL LOW (ref 3.5–5.0)
Alkaline Phosphatase: 106 U/L (ref 38–126)
Anion gap: 9 (ref 5–15)
BUN: 8 mg/dL (ref 8–23)
CO2: 23 mmol/L (ref 22–32)
Calcium: 9.7 mg/dL (ref 8.9–10.3)
Chloride: 101 mmol/L (ref 98–111)
Creatinine, Ser: 0.75 mg/dL (ref 0.44–1.00)
GFR calc Af Amer: >60 mL/min (ref >60)
GFR calc non Af Amer: >60 mL/min (ref >60)
Glucose, Bld: 103 mg/dL — ABNORMAL HIGH (ref 70–99)
Potassium: 3.2 mmol/L — ABNORMAL LOW (ref 3.5–5.1)
Sodium: 133 mmol/L — ABNORMAL LOW (ref 135–145)
Total Bilirubin: 6.4 mg/dL — ABNORMAL HIGH (ref 0.3–1.2)
Total Protein: 6.8 g/dL (ref 6.5–8.1)

## 2019-10-19 LAB — MAGNESIUM: Magnesium: 2 mg/dL (ref 1.7–2.4)

## 2019-10-19 MED ORDER — POTASSIUM CHLORIDE CRYS ER 20 MEQ PO TBCR
40.0000 meq | EXTENDED_RELEASE_TABLET | ORAL | Status: AC
  Administered 2019-10-19 (×2): 40 meq via ORAL
  Filled 2019-10-19 (×2): qty 2

## 2019-10-19 MED ORDER — HYDROMORPHONE HCL 1 MG/ML IJ SOLN
1.0000 mg | Freq: Once | INTRAMUSCULAR | Status: AC
  Administered 2019-10-19: 1 mg via INTRAVENOUS
  Filled 2019-10-19: qty 1

## 2019-10-19 NOTE — Care Management Important Message (Signed)
Important Message  Patient Details  Name: Colleen Lee MRN: [id number] Date of Birth: 06/04/1956   Medicare Important Message Given:  Yes     Tommy Medal 10/19/2019, 2:39 PM

## 2019-10-19 NOTE — Progress Notes (Signed)
MD notified of pt reported pain 10/10. Pt stated the fentanyl has not been effective and requested norco 10 or similar. Pt stated she has taken norco in the past. MD ordered medication (see mar), will reassess

## 2019-10-19 NOTE — Progress Notes (Signed)
Patient Demographics:    Colleen Lee, is a 63 y.o. female, DOB - 03-03-1956, GNF:[id number]  Admit date - 10/17/2019   Admitting Physician Hillary Bow, DO  Outpatient Primary MD for the patient is Avon Gully, MD  LOS - 2   Chief Complaint  Patient presents with  . Emesis        Subjective:    Otto Herb today has no fevers,  ,  No chest pain,   -Not very nauseous anymore -Abdominal pain persist  Assessment  & Plan :    Principal Problem:   Acute pancreatitis Active Problems:   Chronic [name] with cirrhosis (HCC)   Alcoholic cirrhosis of liver without ascites (HCC)   Essential hypertension   Acute renal failure (ARF) (HCC)   Chronic abdominal pain   ETOH abuse  Brief Summary:- 64 year old alcoholic female presented with abdominal pain and vomiting admitted on 10/17/2019 with acute pancreatitis and alcoholic liver disease   A/p 1) acute alcoholic pancreatitis----sips and liquid diet as tolerated -Continue IV fluids, continue as needed antiemetics and IV fentanyl as needed for severe abdominal pain -Nausea has improved significantly  2) history of alcohol abuse with risk for DTs---Tremors  Improving C/n  lorazepam per CIWA protocol along with multivitamin -Child psychotherapist and myself discussed resources for help with alcohol abuse with patient -Since is not really interested in alcohol cessation at this time  3) alcoholic liver disease--- AST to ALT ratio consistent with alcoholic hepatitis-... Platelets are not low =-Continue to monitor LFTs  -Patient also has history of viral [name]  4)HTN--- continue to hold losartan and HCTZ due to dehydration and acute pancreatitis -Continue hydralazine and labetalol as BP allows  5) COPD and tobacco abuse--- no acute exacerbation at this time patient already to quit smoking, continue bronchodilators  6)AKI----acute kidney  injury -due to persistent emesis and dehydration/GI losses compounded by lisinopril HCTZ use PTA - creatinine on admission=2.07  , baseline creatinine = 1.0   , creatinine is now= 0.75 -Continue to  hold lisinopril HCTZ ---  renally adjust medications, avoid nephrotoxic agents / dehydration  / hypotension   Disposition/Need for in-Hospital Stay- patient unable to be discharged at this time due to --acute pancreatitis requiring IV fluids and IV antiemetics and IV opiates pending improvement in abdominal pain and improvement in oral intake -Not medically ready for discharge at this time  Code Status : full   Family Communication:   NA (patient is alert, awake and coherent)  Consults  :  na  DVT Prophylaxis  :  Lovenox - - SCDs  Lab Results  Component Value Date   PLT 212 10/17/2019    Inpatient Medications  Scheduled Meds: . Chlorhexidine Gluconate Cloth  6 each Topical Once   And  . Chlorhexidine Gluconate Cloth  6 each Topical Once  . enoxaparin (LOVENOX) injection  40 mg Subcutaneous QHS  . folic acid  1 mg Oral Daily  . gabapentin  100 mg Oral Daily  . hydrALAZINE  50 mg Oral Q8H  . hydrOXYzine  10 mg Oral Daily  . labetalol  200 mg Oral BID  . mometasone-formoterol  2 puff Inhalation BID  . multivitamin with minerals  1 tablet Oral Daily  . pantoprazole  40 mg Oral Daily  . potassium chloride  40 mEq Oral Q3H  . thiamine  100 mg Intravenous Daily   Or  . thiamine  100 mg Oral Daily   Continuous Infusions: . lactated ringers 150 mL/hr at 10/19/19 1441   PRN Meds:.acetaminophen **OR** acetaminophen, albuterol, cyclobenzaprine, fentaNYL (SUBLIMAZE) injection, LORazepam **OR** LORazepam, ondansetron **OR** ondansetron (ZOFRAN) IV, promethazine    Anti-infectives (From admission, onward)   None        Objective:   Vitals:   10/19/19 0608 10/19/19 0754 10/19/19 1319 10/19/19 1920  BP: 130/78  (!) 91/40   Pulse: 87  90   Resp: 16  18   Temp: 99.5 F (37.5 C)   100.2 F (37.9 C)   TempSrc: Oral  Oral   SpO2: 97% 95% 100% 95%  Weight:      Height:        Wt Readings from Last 3 Encounters:  10/17/19 83.9 kg  09/19/19 85.3 kg  09/07/19 86.1 kg     Intake/Output Summary (Last 24 hours) at 10/19/2019 1927 Last data filed at 10/19/2019 0900 Gross per 24 hour  Intake 1872.84 ml  Output --  Net 1872.84 ml     Physical Exam  Gen:- Awake Alert,  In no apparent distress  HEENT:- [place].AT, No sclera icterus Neck-Supple Neck,No JVD,.  Lungs-  CTAB , fair symmetrical air movement CV- S1, S2 normal, regular  Abd-  +ve B.Sounds, Abd Soft, epigastric tenderness, without rebound or guarding Extremity/Skin:- No  edema, pedal pulses present  Psych-affect is appropriate, oriented x3 Neuro-no new focal deficits, +ve tremors   Data Review:   Micro Results No results found for this or any previous visit (from the past 240 hour(s)).  Radiology Reports CT ABDOMEN PELVIS WO CONTRAST  Result Date: 10/17/2019 CLINICAL DATA:  Nausea and vomiting with upper abdominal pain. EXAM: CT ABDOMEN AND PELVIS WITHOUT CONTRAST TECHNIQUE: Multidetector CT imaging of the abdomen and pelvis was performed following the standard protocol without IV contrast. COMPARISON:  October 07, 2016 FINDINGS: Lower chest: The lung bases are clear. The heart size is normal. Hepatobiliary: There is decreased hepatic attenuation suggestive of hepatic steatosis. Normal gallbladder.There is no biliary ductal dilation. Pancreas: There is mild haziness about the pancreatic head and distal pancreatic body. Spleen: No splenic laceration or hematoma. Adrenals/Urinary Tract: --Adrenal glands: No adrenal hemorrhage. --Right kidney/ureter: No hydronephrosis or perinephric hematoma. --Left kidney/ureter: No hydronephrosis or perinephric hematoma. --Urinary bladder: Unremarkable. Stomach/Bowel: --Stomach/Duodenum: No hiatal hernia or other gastric abnormality. Normal duodenal course and caliber. --Small  bowel: No dilatation or inflammation. --Colon: No focal abnormality. --Appendix: Normal. Vascular/Lymphatic: Atherosclerotic calcification is present within the non-aneurysmal abdominal aorta, without hemodynamically significant stenosis. --No retroperitoneal lymphadenopathy. --No mesenteric lymphadenopathy. --No pelvic or inguinal lymphadenopathy. Reproductive: Fibroids are noted within the uterus. There is no evidence for a significant ovarian mass. Other: No ascites or free air. The abdominal wall is normal. Musculoskeletal. No acute displaced fractures. IMPRESSION: 1. There is mild haziness about the pancreatic head and distal pancreatic body, which may represent early acute pancreatitis. Correlate with exam and laboratory values. 2. Hepatic steatosis. 3. Aortic Atherosclerosis (ICD10-I70.0). Electronically Signed   By: Katherine Mantle M.D.   On: 10/17/2019 20:04   US Abdomen Complete  Result Date: 09/24/2019 CLINICAL DATA:  Cirrhosis. EXAM: ABDOMEN ULTRASOUND COMPLETE COMPARISON:  Abdominal ultrasound dated October 18, 2018. FINDINGS: Gallbladder: No gallstones or wall thickening visualized. No sonographic Murphy sign noted by sonographer. Common bile duct: Diameter: 9 mm, mildly  dilated. Liver: No focal lesion identified. Coarsened echotexture with borderline increased parenchymal echogenicity. Portal vein is patent on color Doppler imaging with normal direction of blood flow towards the liver. IVC: No abnormality visualized. Pancreas: Visualized portion unremarkable. Spleen: Size and appearance within normal limits. Right Kidney: Length: 11.8 cm. Echogenicity within normal limits. No mass or hydronephrosis visualized. Left Kidney: Length: 11.1 cm. Echogenicity within normal limits. No mass or hydronephrosis visualized. Abdominal aorta: No aneurysm visualized. Other findings: None. IMPRESSION: 1. Stable sonographic appearance of the liver suggestive of underlying liver disease. No discrete lesion. 2.  Increased mild common bile duct dilatation measuring up to 9 mm. Correlate with LFTs and consider further evaluation with ERCP or MRCP as clinically indicated. Electronically Signed   By: Obie Dredge M.D.   On: 09/24/2019 15:35     CBC Recent Labs  Lab 10/17/19 1647  WBC 9.5  HGB 15.2*  HCT 45.6  PLT 212  MCV 98.7  MCH 32.9  MCHC 33.3  RDW 13.1  LYMPHSABS 1.1  MONOABS 0.4  EOSABS 0.1  BASOSABS 0.0    Chemistries  Recent Labs  Lab 10/17/19 1647 10/18/19 0604 10/19/19 0719  NA 134* 132* 133*  K 2.8* 3.2* 3.2*  CL 99 103 101  CO2 22 18* 23  GLUCOSE 146* 119* 103*  BUN 14 13 8   CREATININE 2.07* 1.14* 0.75  CALCIUM 10.7* 9.8 9.7  MG 2.4  --  2.0  AST 68* 184* 296*  ALT 50* 68* 153*  ALKPHOS 74 76 106  BILITOT 2.1* 3.9* 6.4*   ------------------------------------------------------------------------------------------------------------------ No results for input(s): CHOL, HDL, LDLCALC, TRIG, CHOLHDL, LDLDIRECT in the last 72 hours.  Lab Results  Component Value Date   HGBA1C 5.4 12/18/2017   ------------------------------------------------------------------------------------------------------------------ No results for input(s): TSH, T4TOTAL, T3FREE, THYROIDAB in the last 72 hours.  Invalid input(s): FREET3 ------------------------------------------------------------------------------------------------------------------ No results for input(s): VITAMINB12, FOLATE, FERRITIN, TIBC, IRON, RETICCTPCT in the last 72 hours.  Coagulation profile No results for input(s): INR, PROTIME in the last 168 hours.  No results for input(s): DDIMER in the last 72 hours.  Cardiac Enzymes No results for input(s): CKMB, TROPONINI, MYOGLOBIN in the last 168 hours.  Invalid input(s): CK ------------------------------------------------------------------------------------------------------------------    Component Value Date/Time   BNP 11.0 10/11/2015 1141   Alizee Maple  M.D on 10/19/2019 at 7:27 PM  Go to [email or web address] - for contact info  [facility] - Office  [phone]

## 2019-10-19 NOTE — Progress Notes (Signed)
Has had no nausea but c/o pain rated a "12" and has been requesting pain medication almost Q2hours.  No signs of alcohol withdrawal and sister has been at bedside for much of day.  BP was 91/40 earlier and sent message to Dr. Maurene Capes concerning 1400 dose of apresoline and he messaged  to hold that dose.

## 2019-10-20 DIAGNOSIS — K7030 Alcoholic cirrhosis of liver without ascites: Secondary | ICD-10-CM

## 2019-10-20 DIAGNOSIS — I10 Essential (primary) hypertension: Secondary | ICD-10-CM

## 2019-10-20 DIAGNOSIS — B182 Chronic viral hepatitis C: Secondary | ICD-10-CM

## 2019-10-20 DIAGNOSIS — N179 Acute kidney failure, unspecified: Secondary | ICD-10-CM

## 2019-10-20 DIAGNOSIS — K7460 Unspecified cirrhosis of liver: Secondary | ICD-10-CM

## 2019-10-20 DIAGNOSIS — K852 Alcohol induced acute pancreatitis without necrosis or infection: Principal | ICD-10-CM

## 2019-10-20 DIAGNOSIS — F1010 Alcohol abuse, uncomplicated: Secondary | ICD-10-CM

## 2019-10-20 LAB — COMPREHENSIVE METABOLIC PANEL
ALT: 121 U/L — ABNORMAL HIGH (ref 0–44)
AST: 131 U/L — ABNORMAL HIGH (ref 15–41)
Albumin: 2.9 g/dL — ABNORMAL LOW (ref 3.5–5.0)
Alkaline Phosphatase: 94 U/L (ref 38–126)
Anion gap: 9 (ref 5–15)
BUN: 7 mg/dL — ABNORMAL LOW (ref 8–23)
CO2: 21 mmol/L — ABNORMAL LOW (ref 22–32)
Calcium: 9.7 mg/dL (ref 8.9–10.3)
Chloride: 104 mmol/L (ref 98–111)
Creatinine, Ser: 0.83 mg/dL (ref 0.44–1.00)
GFR calc Af Amer: >60 mL/min (ref >60)
GFR calc non Af Amer: >60 mL/min (ref >60)
Glucose, Bld: 89 mg/dL (ref 70–99)
Potassium: 4.1 mmol/L (ref 3.5–5.1)
Sodium: 134 mmol/L — ABNORMAL LOW (ref 135–145)
Total Bilirubin: 2.8 mg/dL — ABNORMAL HIGH (ref 0.3–1.2)
Total Protein: 6.6 g/dL (ref 6.5–8.1)

## 2019-10-20 LAB — LIPASE, BLOOD: Lipase: 148 U/L — ABNORMAL HIGH (ref 11–51)

## 2019-10-20 LAB — GLUCOSE, CAPILLARY
Glucose-Capillary: 111 mg/dL — ABNORMAL HIGH (ref 70–99)
Glucose-Capillary: 89 mg/dL (ref 70–99)
Glucose-Capillary: 89 mg/dL (ref 70–99)
Glucose-Capillary: 91 mg/dL (ref 70–99)

## 2019-10-20 MED ORDER — OXYCODONE HCL 5 MG PO TABS
5.0000 mg | ORAL_TABLET | ORAL | Status: DC | PRN
  Filled 2019-10-20: qty 1

## 2019-10-20 MED ORDER — OXYCODONE HCL 5 MG PO TABS
5.0000 mg | ORAL_TABLET | ORAL | Status: AC | PRN
  Administered 2019-10-20 (×2): 5 mg via ORAL
  Filled 2019-10-20 (×2): qty 1

## 2019-10-20 MED ORDER — OXYCODONE HCL 5 MG PO TABS
5.0000 mg | ORAL_TABLET | ORAL | Status: DC | PRN
  Administered 2019-10-20: 5 mg via ORAL
  Administered 2019-10-20: 10 mg via ORAL
  Filled 2019-10-20: qty 2

## 2019-10-20 NOTE — Discharge Summary (Signed)
Physician Discharge Summary  Colleen Lee [id number] DOB: [birth year] DOA: 10/17/2019  PCP: Rosita Fire, MD  Admit date: 10/17/2019 Discharge date: 10/20/2019  Admitted From: Home Disposition: Home  Recommendations for Outpatient Follow-up:  1. Follow up with PCP in 1-2 weeks 2. Please obtain BMP/CBC in one week 3. Patient was extensively counseled on the importance of abstaining from any further alcohol intake.  She was provided resources on alcohol rehab programs.   Discharge Condition: Stable CODE STATUS: Full code Diet recommendation: Heart healthy  Brief/Interim Summary: 64 year old alcoholic female presented with abdominal pain and vomiting admitted on 10/17/2019 with acute pancreatitis and alcoholic liver disease   A/p 1) acute alcoholic pancreatitis--patient was treated with bowel rest, IV fluids and pain management.  Overall symptoms have improved.  Pain is better controlled with oral pain medicine.  Diet was advanced and she is now tolerating a solid diet.  She has been strongly advised to abstain from any further alcohol use.  2) history of alcohol abuse with risk for DTs--- patient was treated with CIWA protocol and Ativan.  Currently, he does not have any tremors or any signs of withdrawal.  CIWA scores are 0.  She was seen by social work and provided resources regarding alcohol rehab.  She appears interested in quitting drinking.  3) alcoholic liver disease--- AST to ALT ratio consistent with alcoholic hepatitis-... Platelets are not low = LFTs are trending down throughout hospital course, this can be followed as an outpatient -Patient also has history of viral [name]  4)HTN--- continue to hold losartan and HCTZ due to dehydration and acute pancreatitis -Continue hydralazine and labetalol  -Blood pressure is low normal at this time.  5) COPD and tobacco abuse--- no acute exacerbation at this time patient already to quit smoking, continue  bronchodilators  6)AKI----acute kidney injury -due to persistent emesis and dehydration/GI losses compounded by lisinopril HCTZ use PTA - creatinine on admission=2.07  , baseline creatinine = 1.0   , creatinine is now= 0.75 -Continue to  hold lisinopril HCTZ ---  renally adjust medications, avoid nephrotoxic agents / dehydration  / hypotension  Discharge Diagnoses:  Principal Problem:   Acute pancreatitis Active Problems:   Chronic [name] with cirrhosis (HCC)   Alcoholic cirrhosis of liver without ascites (HCC)   Essential hypertension   Acute renal failure (ARF) (HCC)   Chronic abdominal pain   ETOH abuse    Discharge Instructions  Discharge Instructions    Diet - low sodium heart healthy   Complete by: As directed    Increase activity slowly   Complete by: As directed      Allergies as of 10/20/2019      Reactions   Penicillins Rash   Did it involve swelling of the face/tongue/throat, SOB, or low BP? No Did it involve sudden or severe rash/hives, skin peeling, or any reaction on the inside of your mouth or nose? No Did you need to seek medical attention at a hospital or doctor's office? No When did it last happen?Many years ago If all above answers are "NO", may proceed with cephalosporin use.      Medication List    STOP taking these medications   hydrochlorothiazide 12.5 MG tablet Commonly known as: HYDRODIURIL   ibuprofen 800 MG tablet Commonly known as: ADVIL   losartan 100 MG tablet Commonly known as: COZAAR     TAKE these medications   albuterol 108 (90 Base) MCG/ACT inhaler Commonly known as: VENTOLIN HFA Inhale 1-2 puffs into  the lungs every 6 (six) hours as needed for wheezing or shortness of breath.   budesonide-formoterol 160-4.5 MCG/ACT inhaler Commonly known as: SYMBICORT Inhale 2 puffs into the lungs daily.   cyclobenzaprine 5 MG tablet Commonly known as: FLEXERIL Take 1 tablet (5 mg total) by mouth 3 (three) times daily as  needed for muscle spasms. What changed: when to take this   gabapentin 100 MG capsule Commonly known as: NEURONTIN Take 1 capsule (100 mg total) by mouth 3 (three) times daily. What changed: when to take this   hydrALAZINE 50 MG tablet Commonly known as: APRESOLINE Take 1 tablet (50 mg total) by mouth every 8 (eight) hours.   HYDROcodone-acetaminophen 5-325 MG tablet Commonly known as: NORCO/VICODIN Take 1 tablet by mouth every 8 (eight) hours as needed for up to 7 days for moderate pain. TAKE 1 TABLET BY MOUTH EVERY 6 HOURS AS NEEDED FOR UP TO 7 DAYS FOR MODERATE PAIN.   hydrOXYzine 10 MG tablet Commonly known as: ATARAX/VISTARIL Take 10 mg by mouth daily.   labetalol 100 MG tablet Commonly known as: NORMODYNE Take 2 tablets (200 mg total) by mouth 2 (two) times daily.   megestrol 40 MG tablet Commonly known as: MEGACE Take 40 mg by mouth daily.   ondansetron 4 MG disintegrating tablet Commonly known as: Zofran ODT Take 1 tablet (4 mg total) by mouth 3 (three) times daily. 4mg  ODT q4 hours prn nausea/vomit What changed:   when to take this  reasons to take this  additional instructions   pantoprazole 40 MG tablet Commonly known as: PROTONIX Take 1 tablet (40 mg total) by mouth daily. 30 minutes before breakfast   Trulance 3 MG Tabs Generic drug: Plecanatide Take 3 mg by mouth daily.   Virtussin A/C 100-10 MG/5ML syrup Generic drug: guaiFENesin-codeine Take 10 mLs by mouth every 6 (six) hours as needed.   VITAMIN B 12 PO Take 1 tablet by mouth daily.      Follow-up Information    Rosita Fire, MD. Schedule an appointment as soon as possible for a visit in 2 week(s).   Specialty: Internal Medicine Contact information: [place] [address] [phone]          Allergies  Allergen Reactions  . Penicillins Rash    Did it involve swelling of the face/tongue/throat, SOB, or low BP? No Did it involve sudden or severe  rash/hives, skin peeling, or any reaction on the inside of your mouth or nose? No Did you need to seek medical attention at a hospital or doctor's office? No When did it last happen?Many years ago If all above answers are "NO", may proceed with cephalosporin use.      Consultations:     Procedures/Studies: CT ABDOMEN PELVIS WO CONTRAST  Result Date: 10/17/2019 CLINICAL DATA:  Nausea and vomiting with upper abdominal pain. EXAM: CT ABDOMEN AND PELVIS WITHOUT CONTRAST TECHNIQUE: Multidetector CT imaging of the abdomen and pelvis was performed following the standard protocol without IV contrast. COMPARISON:  October 07, 2016 FINDINGS: Lower chest: The lung bases are clear. The heart size is normal. Hepatobiliary: There is decreased hepatic attenuation suggestive of hepatic steatosis. Normal gallbladder.There is no biliary ductal dilation. Pancreas: There is mild haziness about the pancreatic head and distal pancreatic body. Spleen: No splenic laceration or hematoma. Adrenals/Urinary Tract: --Adrenal glands: No adrenal hemorrhage. --Right kidney/ureter: No hydronephrosis or perinephric hematoma. --Left kidney/ureter: No hydronephrosis or perinephric hematoma. --Urinary bladder: Unremarkable. Stomach/Bowel: --Stomach/Duodenum: No hiatal hernia or other  gastric abnormality. Normal duodenal course and caliber. --Small bowel: No dilatation or inflammation. --Colon: No focal abnormality. --Appendix: Normal. Vascular/Lymphatic: Atherosclerotic calcification is present within the non-aneurysmal abdominal aorta, without hemodynamically significant stenosis. --No retroperitoneal lymphadenopathy. --No mesenteric lymphadenopathy. --No pelvic or inguinal lymphadenopathy. Reproductive: Fibroids are noted within the uterus. There is no evidence for a significant ovarian mass. Other: No ascites or free air. The abdominal wall is normal. Musculoskeletal. No acute displaced fractures. IMPRESSION: 1. There is mild  haziness about the pancreatic head and distal pancreatic body, which may represent early acute pancreatitis. Correlate with exam and laboratory values. 2. Hepatic steatosis. 3. Aortic Atherosclerosis (ICD10-I70.0). Electronically Signed   By: Constance Holster M.D.   On: 10/17/2019 20:04   US Abdomen Complete  Result Date: 09/24/2019 CLINICAL DATA:  Cirrhosis. EXAM: ABDOMEN ULTRASOUND COMPLETE COMPARISON:  Abdominal ultrasound dated October 18, 2018. FINDINGS: Gallbladder: No gallstones or wall thickening visualized. No sonographic Murphy sign noted by sonographer. Common bile duct: Diameter: 9 mm, mildly dilated. Liver: No focal lesion identified. Coarsened echotexture with borderline increased parenchymal echogenicity. Portal vein is patent on color Doppler imaging with normal direction of blood flow towards the liver. IVC: No abnormality visualized. Pancreas: Visualized portion unremarkable. Spleen: Size and appearance within normal limits. Right Kidney: Length: 11.8 cm. Echogenicity within normal limits. No mass or hydronephrosis visualized. Left Kidney: Length: 11.1 cm. Echogenicity within normal limits. No mass or hydronephrosis visualized. Abdominal aorta: No aneurysm visualized. Other findings: None. IMPRESSION: 1. Stable sonographic appearance of the liver suggestive of underlying liver disease. No discrete lesion. 2. Increased mild common bile duct dilatation measuring up to 9 mm. Correlate with LFTs and consider further evaluation with ERCP or MRCP as clinically indicated. Electronically Signed   By: Titus Dubin M.D.   On: 09/24/2019 15:35       Subjective: Patient is feeling better.  Abdominal pain is improved with oral pain medicine.  She is not having nausea or vomiting.  She is tolerating a solid diet.  Discharge Exam: Vitals:   10/20/19 [id number] 10/20/19 0712 10/20/19 0908 10/20/19 1348  BP: 106/74  124/81 106/68  Pulse: 85  84 83  Resp:    18  Temp: 99.2 F (37.3 C)   98.4 F  (36.9 C)  TempSrc: Oral   Oral  SpO2: 98% 100%  99%  Weight:      Height:        General: Pt is alert, awake, not in acute distress Cardiovascular: RRR, S1/S2 +, no rubs, no gallops Respiratory: CTA bilaterally, no wheezing, no rhonchi Abdominal: Soft, NT, ND, bowel sounds + Extremities: no edema, no cyanosis    The results of significant diagnostics from this hospitalization (including imaging, microbiology, ancillary and laboratory) are listed below for reference.     Microbiology: No results found for this or any previous visit (from the past 240 hour(s)).   Labs: BNP (last 3 results) No results for input(s): BNP in the last 8760 hours. Basic Metabolic Panel: Recent Labs  Lab 10/17/19 1647 10/18/19 0604 10/19/19 0719 10/20/19 0455  NA 134* 132* 133* 134*  K 2.8* 3.2* 3.2* 4.1  CL 99 103 101 104  CO2 22 18* 23 21*  GLUCOSE 146* 119* 103* 89  BUN 14 13 8  7*  CREATININE 2.07* 1.14* 0.75 0.83  CALCIUM 10.7* 9.8 9.7 9.7  MG 2.4  --  2.0  --   PHOS 3.2  --   --   --    Liver Function Tests: Recent  Labs  Lab 10/17/19 1647 10/18/19 0604 10/19/19 0719 10/20/19 0455  AST 68* 184* 296* 131*  ALT 50* 68* 153* 121*  ALKPHOS 74 76 106 94  BILITOT 2.1* 3.9* 6.4* 2.8*  PROT 9.3* 7.3 6.8 6.6  ALBUMIN 4.2 3.4* 3.0* 2.9*   Recent Labs  Lab 10/17/19 1647 10/20/19 0455  LIPASE 187* 148*   No results for input(s): AMMONIA in the last 168 hours. CBC: Recent Labs  Lab 10/17/19 1647  WBC 9.5  NEUTROABS 7.8*  HGB 15.2*  HCT 45.6  MCV 98.7  PLT 212   Cardiac Enzymes: No results for input(s): CKTOTAL, CKMB, CKMBINDEX, TROPONINI in the last 168 hours. BNP: Invalid input(s): POCBNP CBG: Recent Labs  Lab 10/20/19 0019 10/20/19 0617 10/20/19 0738 10/20/19 1108  GLUCAP 111* 91 89 89   D-Dimer No results for input(s): DDIMER in the last 72 hours. Hgb A1c No results for input(s): HGBA1C in the last 72 hours. Lipid Profile No results for input(s): CHOL,  HDL, LDLCALC, TRIG, CHOLHDL, LDLDIRECT in the last 72 hours. Thyroid function studies No results for input(s): TSH, T4TOTAL, T3FREE, THYROIDAB in the last 72 hours.  Invalid input(s): FREET3 Anemia work up No results for input(s): VITAMINB12, FOLATE, FERRITIN, TIBC, IRON, RETICCTPCT in the last 72 hours. Urinalysis    Component Value Date/Time   COLORURINE YELLOW 10/18/2019 1600   APPEARANCEUR CLEAR 10/18/2019 1600   LABSPEC 1.014 10/18/2019 1600   PHURINE 6.0 10/18/2019 1600   GLUCOSEU NEGATIVE 10/18/2019 1600   HGBUR NEGATIVE 10/18/2019 1600   BILIRUBINUR NEGATIVE 10/18/2019 1600   KETONESUR NEGATIVE 10/18/2019 1600   PROTEINUR 30 (A) 10/18/2019 1600   UROBILINOGEN 0.2 04/01/2015 1140   NITRITE NEGATIVE 10/18/2019 1600   LEUKOCYTESUR NEGATIVE 10/18/2019 1600   Sepsis Labs Invalid input(s): PROCALCITONIN,  WBC,  LACTICIDVEN Microbiology No results found for this or any previous visit (from the past 240 hour(s)).   Time coordinating discharge: 42mins  SIGNED:   Kathie Dike, MD  [facility] 10/20/2019, 7:54 PM   If 7PM-7AM, please contact night-coverage [email or web address]

## 2019-10-20 NOTE — Progress Notes (Signed)
Pt lost IV access. Multiple failed attempts. Ultrasound IV was unsuccessful too. MD notified. MD ordered PO pain med. IV fluids stopped. Will monitor patient.

## 2019-10-20 NOTE — Progress Notes (Signed)
Nsg Discharge Note  Admit Date:  10/17/2019 Discharge date: 10/20/2019   ROENA POSTLEWAITE to be D/C'd Home per order.  Patient able to verbalize understanding.  Discharge Medication: Allergies as of 10/20/2019      Reactions   Penicillins Rash   Did it involve swelling of the face/tongue/throat, SOB, or low BP? No Did it involve sudden or severe rash/hives, skin peeling, or any reaction on the inside of your mouth or nose? No Did you need to seek medical attention at a hospital or doctor's office? No When did it last happen?Many years ago If all above answers are "NO", may proceed with cephalosporin use.      Medication List    STOP taking these medications   hydrochlorothiazide 12.5 MG tablet Commonly known as: HYDRODIURIL   ibuprofen 800 MG tablet Commonly known as: ADVIL   losartan 100 MG tablet Commonly known as: COZAAR     TAKE these medications   albuterol 108 (90 Base) MCG/ACT inhaler Commonly known as: VENTOLIN HFA Inhale 1-2 puffs into the lungs every 6 (six) hours as needed for wheezing or shortness of breath.   budesonide-formoterol 160-4.5 MCG/ACT inhaler Commonly known as: SYMBICORT Inhale 2 puffs into the lungs daily.   cyclobenzaprine 5 MG tablet Commonly known as: FLEXERIL Take 1 tablet (5 mg total) by mouth 3 (three) times daily as needed for muscle spasms. What changed: when to take this   gabapentin 100 MG capsule Commonly known as: NEURONTIN Take 1 capsule (100 mg total) by mouth 3 (three) times daily. What changed: when to take this   hydrALAZINE 50 MG tablet Commonly known as: APRESOLINE Take 1 tablet (50 mg total) by mouth every 8 (eight) hours.   HYDROcodone-acetaminophen 5-325 MG tablet Commonly known as: NORCO/VICODIN Take 1 tablet by mouth every 8 (eight) hours as needed for up to 7 days for moderate pain. TAKE 1 TABLET BY MOUTH EVERY 6 HOURS AS NEEDED FOR UP TO 7 DAYS FOR MODERATE PAIN.   hydrOXYzine 10 MG tablet Commonly known  as: ATARAX/VISTARIL Take 10 mg by mouth daily.   labetalol 100 MG tablet Commonly known as: NORMODYNE Take 2 tablets (200 mg total) by mouth 2 (two) times daily.   megestrol 40 MG tablet Commonly known as: MEGACE Take 40 mg by mouth daily.   ondansetron 4 MG disintegrating tablet Commonly known as: Zofran ODT Take 1 tablet (4 mg total) by mouth 3 (three) times daily. 4mg  ODT q4 hours prn nausea/vomit What changed:   when to take this  reasons to take this  additional instructions   pantoprazole 40 MG tablet Commonly known as: PROTONIX Take 1 tablet (40 mg total) by mouth daily. 30 minutes before breakfast   Trulance 3 MG Tabs Generic drug: Plecanatide Take 3 mg by mouth daily.   Virtussin A/C 100-10 MG/5ML syrup Generic drug: guaiFENesin-codeine Take 10 mLs by mouth every 6 (six) hours as needed.   VITAMIN B 12 PO Take 1 tablet by mouth daily.       Discharge Assessment: Vitals:   10/20/19 0908 10/20/19 1348  BP: 124/81 106/68  Pulse: 84 83  Resp:  18  Temp:  98.4 F (36.9 C)  SpO2:  99%   Skin clean, dry and intact without evidence of skin break down, no evidence of skin tears noted. IV catheter discontinued intact. Site without signs and symptoms of complications - no redness or edema noted at insertion site, patient denies c/o pain - only slight tenderness at site.  Dressing with slight pressure applied.  D/c Instructions-Education: Discharge instructions given to patient with verbalized understanding. D/c education completed with patient including follow up instructions, medication list, d/c activities limitations if indicated, with other d/c instructions as indicated by MD - patient able to verbalize understanding, all questions fully answered. Patient instructed to return to ED, call 911, or call MD for any changes in condition.  Patient escorted via [place], and D/C home via private auto.  Berton Bon, RN 10/20/2019 2:44 PM

## 2019-10-23 ENCOUNTER — Telehealth: Payer: Self-pay | Admitting: Orthopedic Surgery

## 2019-10-23 ENCOUNTER — Other Ambulatory Visit: Payer: Self-pay | Admitting: Orthopedic Surgery

## 2019-10-23 DIAGNOSIS — G8918 Other acute postprocedural pain: Secondary | ICD-10-CM

## 2019-10-23 NOTE — Telephone Encounter (Signed)
Patient requests refill on Hydrocodone/Acetaminophen 5-325  Mgs.  Qty  21      Sig: Take 1 tablet by mouth every 8 (eight) hours as needed for up to 7 days for moderate pain. TAKE 1 TABLET BY MOUTH EVERY 6 HOURS AS NEEDED FOR UP TO 7 DAYS FOR MODERATE PAIN.       Patient states she uses [facility]  

## 2019-10-26 DIAGNOSIS — N179 Acute kidney failure, unspecified: Secondary | ICD-10-CM | POA: Diagnosis not present

## 2019-10-26 DIAGNOSIS — J449 Chronic obstructive pulmonary disease, unspecified: Secondary | ICD-10-CM | POA: Diagnosis not present

## 2019-10-30 ENCOUNTER — Other Ambulatory Visit: Payer: Self-pay | Admitting: Orthopedic Surgery

## 2019-10-30 DIAGNOSIS — G8918 Other acute postprocedural pain: Secondary | ICD-10-CM

## 2019-10-30 MED ORDER — HYDROCODONE-ACETAMINOPHEN 5-325 MG PO TABS: ORAL_TABLET | ORAL | 0 refills | Status: DC

## 2019-10-30 NOTE — Telephone Encounter (Signed)
Patient requests refill on Hydrocodone/Acetaminophen 5-325  Mgs.  Qty  21  Sig: TAKE 1 TABLET BY MOUTH EVERY 8 HOURS AS NEEDED FOR UP TO 7 DAYS FOR MODERATE PAIN.  Patient states she uses [facility]

## 2019-11-05 ENCOUNTER — Other Ambulatory Visit: Payer: Self-pay | Admitting: Orthopedic Surgery

## 2019-11-05 DIAGNOSIS — G8918 Other acute postprocedural pain: Secondary | ICD-10-CM

## 2019-11-05 MED ORDER — HYDROCODONE-ACETAMINOPHEN 5-325 MG PO TABS: ORAL_TABLET | ORAL | 0 refills | Status: DC

## 2019-11-05 NOTE — Telephone Encounter (Signed)
Patient called for refill:  HYDROcodone-acetaminophen (NORCO/VICODIN) 5-325 MG tablet 21 tablet  -[facility]  

## 2019-11-08 NOTE — Pre-Procedure Instructions (Signed)
Patient was recently discharged from hospital since she was seen @ [facility] and scheduled for EGD. Interoffice note sent to Dr Gala Romney to see if he is okay proceeding with EGD on 11/12/2019.

## 2019-11-09 ENCOUNTER — Encounter (HOSPITAL_COMMUNITY)
Admission: RE | Admit: 2019-11-09 | Discharge: 2019-11-09 | Disposition: A | Discharge status: Home / Self Care | Payer: Medicare Other | Source: Ambulatory Visit | Attending: Internal Medicine | Admitting: Internal Medicine

## 2019-11-09 ENCOUNTER — Other Ambulatory Visit (HOSPITAL_COMMUNITY)
Admission: RE | Admit: 2019-11-09 | Discharge: 2019-11-09 | Disposition: A | Discharge status: Home / Self Care | Payer: Medicare Other | Source: Ambulatory Visit | Attending: Internal Medicine | Admitting: Internal Medicine

## 2019-11-09 ENCOUNTER — Telehealth: Payer: Self-pay

## 2019-11-09 ENCOUNTER — Other Ambulatory Visit (HOSPITAL_COMMUNITY): Admission: RE | Admit: 2019-11-09 | Payer: Medicare Other | Source: Ambulatory Visit

## 2019-11-09 ENCOUNTER — Other Ambulatory Visit: Payer: Self-pay

## 2019-11-09 ENCOUNTER — Other Ambulatory Visit (HOSPITAL_COMMUNITY): Payer: Medicare Other

## 2019-11-09 ENCOUNTER — Encounter (HOSPITAL_COMMUNITY): Payer: Self-pay

## 2019-11-09 NOTE — Patient Instructions (Signed)
44    Your procedure is scheduled on: 11/12/2019  Report to [facility] at   9:30  AM.  Call this number if you have problems the morning of surgery: [phone]   Remember:   Do not drink or eat food:After Midnight.        No Smoking the day of procedure      Take these medicines the morning of surgery with A SIP OF WATER:    Do not wear jewelry, make-up or nail polish.  Do not wear lotions, powders, or perfumes. You may wear deodorant.                Do not bring valuables to the hospital.  Contacts, dentures or bridgework may not be worn into surgery.  Leave suitcase in the car. After surgery it may be brought to your room.  For patients admitted to the hospital, checkout time is 11:00 AM the day of discharge.   Patients discharged the day of surgery will not be allowed to drive home. Upper Endoscopy, Adult Upper endoscopy is a procedure to look inside the upper GI (gastrointestinal) tract. The upper GI tract is made up of:  The part of the body that moves food from your mouth to your stomach (esophagus).  The stomach.  The first part of your small intestine (duodenum). This procedure is also called esophagogastroduodenoscopy (EGD) or gastroscopy. In this procedure, your health care provider passes a thin, flexible tube (endoscope) through your mouth and down your esophagus into your stomach. A small camera is attached to the end of the tube. Images from the camera appear on a monitor in the exam room. During this procedure, your health care provider may also remove a small piece of tissue to be sent to a lab and examined under a microscope (biopsy). Your health care provider may do an upper endoscopy to diagnose cancers of the upper GI tract. You may also have this procedure to find the cause of other conditions, such as:  Stomach pain.  Heartburn.  Pain or problems when swallowing.  Nausea and vomiting.  Stomach bleeding.  Stomach ulcers. Tell a health care provider  about:  Any allergies you have.  All medicines you are taking, including vitamins, herbs, eye drops, creams, and over-the-counter medicines.  Any problems you or family members have had with anesthetic medicines.  Any blood disorders you have.  Any surgeries you have had.  Any medical conditions you have.  Whether you are pregnant or may be pregnant. What are the risks? Generally, this is a safe procedure. However, problems may occur, including:  Infection.  Bleeding.  Allergic reactions to medicines.  A tear or hole (perforation) in the esophagus, stomach, or duodenum. What happens before the procedure? Staying hydrated Follow instructions from your health care provider about hydration, which may include:  Up to 2 hours before the procedure - you may continue to drink clear liquids, such as water, clear fruit juice, black coffee, and plain tea.  Eating and drinking restrictions Follow instructions from your health care provider about eating and drinking, which may include:  8 hours before the procedure - stop eating heavy meals or foods, such as meat, fried foods, or fatty foods.  6 hours before the procedure - stop eating light meals or foods, such as toast or cereal.  6 hours before the procedure - stop drinking milk or drinks that contain milk.  2 hours before the procedure - stop drinking clear liquids. Medicines Ask your  health care provider about:  Changing or stopping your regular medicines. This is especially important if you are taking diabetes medicines or blood thinners.  Taking medicines such as aspirin and ibuprofen. These medicines can thin your blood. Do not take these medicines unless your health care provider tells you to take them.  Taking over-the-counter medicines, vitamins, herbs, and supplements. General instructions  Plan to have someone take you home from the hospital or clinic.  If you will be going home right after the procedure, plan to  have someone with you for 24 hours.  Ask your health care provider what steps will be taken to help prevent infection. What happens during the procedure?  1. An IV will be inserted into one of your veins. 2. You may be given one or more of the following: ? A medicine to help you relax (sedative). ? A medicine to numb the throat (local anesthetic). 3. You will lie on your left side on an exam table. 4. Your health care provider will pass the endoscope through your mouth and down your esophagus. 5. Your health care provider will use the scope to check the inside of your esophagus, stomach, and duodenum. Biopsies may be taken. 6. The endoscope will be removed. The procedure may vary among health care providers and hospitals. What happens after the procedure?  Your blood pressure, heart rate, breathing rate, and blood oxygen level will be monitored until you leave the hospital or clinic.  Do not drive for 24 hours if you were given a sedative during your procedure.  When your throat is no longer numb, you may be given some fluids to drink.  It is up to you to get the results of your procedure. Ask your health care provider, or the department that is doing the procedure, when your results will be ready. Summary  Upper endoscopy is a procedure to look inside the upper GI tract.  During the procedure, an IV will be inserted into one of your veins. You may be given a medicine to help you relax.  A medicine will be used to numb your throat.  The endoscope will be passed through your mouth and down your esophagus. This information is not intended to replace advice given to you by your health care provider. Make sure you discuss any questions you have with your health care provider. Document Revised: 01/11/2018 Document Reviewed: 12/19/2017 Elsevier Patient Education  [place] After  Please read the instructions outlined below and refer to this sheet in the next few weeks. These discharge instructions provide you with general information on caring for yourself after you leave the hospital. Your doctor may also give you specific instructions. While your treatment has  been planned according to the most current medical practices available, unavoidable complications occasionally occur. If you have any problems or questions after discharge, please call your doctor. HOME CARE INSTRUCTIONS Activity  You may resume your regular activity but move at a slower pace for the next 24 hours.   Take frequent rest periods for the next 24 hours.   Walking will help expel (get rid of) the air and reduce the bloated feeling in your abdomen.   No driving for 24 hours (because of the anesthesia (medicine) used during the test).   You may shower.   Do not sign any important legal documents or operate any machinery for 24 hours (because of the anesthesia used during the test).  Nutrition  Drink plenty of fluids.   You may resume your normal diet.   Begin with a light meal and progress to your normal diet.   Avoid alcoholic beverages for 24 hours or as instructed by your caregiver.  Medications You may resume your normal medications unless your caregiver tells you otherwise. What you can expect today  You may experience abdominal discomfort such as a feeling of fullness or "gas" pains.   You may experience a sore throat for 2 to 3 days. This is normal. Gargling with salt water may help this.  Follow-up Your doctor will discuss the results of your test with you. SEEK IMMEDIATE MEDICAL CARE IF:  You have excessive nausea (feeling sick to your stomach) and/or vomiting.   You have severe abdominal pain and distention (swelling).   You have trouble swallowing.   You have a temperature over 100 F (37.8 C).   You have rectal  bleeding or vomiting of blood.  Document Released: 03/02/2004 Document Revised: 07/08/2011 Document Reviewed: 09/13/2007

## 2019-11-09 NOTE — Telephone Encounter (Signed)
-----   Message from Daneil Dolin, MD sent at 11/09/2019 10:05 AM EDT ----- Regarding: RE: Recent hospital stay Thanks Kim;  Pt needs repeat triage with App to assess candidcay for upcoming procedure ----- Message ----- From: Encarnacion Chu, RN Sent: 11/08/2019   7:47 AM EDT To: Daneil Dolin, MD Subject: Recent hospital stay                           Good morning Dr Gala Romney! I hope it's a nice day for you! I was looking over my PAT charts for tomorrow and saw that Ms Delfino was just discharged from hospital on 10/20/2019 for acute alcoholic pancreatitis and is to follow up with her PCP next week. I just wanted to to be aware and take a look at that admission and make sure you were okay to proceed with her EGD on Monday 11/12/2019. Thank you!

## 2019-11-09 NOTE — Telephone Encounter (Signed)
Called pt, informed her she will need OV and procedure for 11/12/19 canceled. OV scheduled for 11/28/19. Informed endo scheduler to cancel procedure.

## 2019-11-12 ENCOUNTER — Encounter (HOSPITAL_COMMUNITY): Admission: RE | Payer: Self-pay | Source: Home / Self Care

## 2019-11-12 ENCOUNTER — Ambulatory Visit (HOSPITAL_COMMUNITY): Admission: RE | Admit: 2019-11-12 | Payer: Medicare Other | Source: Home / Self Care | Admitting: Internal Medicine

## 2019-11-12 ENCOUNTER — Other Ambulatory Visit: Payer: Self-pay | Admitting: Orthopedic Surgery

## 2019-11-12 DIAGNOSIS — G8918 Other acute postprocedural pain: Secondary | ICD-10-CM

## 2019-11-12 SURGERY — ESOPHAGOGASTRODUODENOSCOPY (EGD) WITH PROPOFOL
Anesthesia: Monitor Anesthesia Care

## 2019-11-12 NOTE — Telephone Encounter (Signed)
Patient called for refill:  HYDROcodone-acetaminophen (NORCO/VICODIN) 5-325 MG tablet 21 tablet  -[facility]  

## 2019-11-13 MED ORDER — HYDROCODONE-ACETAMINOPHEN 5-325 MG PO TABS: 1.0000 | ORAL_TABLET | Freq: Three times a day (TID) | ORAL | 0 refills | Status: DC | PRN

## 2019-11-19 ENCOUNTER — Other Ambulatory Visit: Payer: Self-pay

## 2019-11-19 DIAGNOSIS — G8918 Other acute postprocedural pain: Secondary | ICD-10-CM

## 2019-11-19 MED ORDER — HYDROCODONE-ACETAMINOPHEN 5-325 MG PO TABS: 1.0000 | ORAL_TABLET | Freq: Three times a day (TID) | ORAL | 0 refills | Status: DC | PRN

## 2019-11-19 NOTE — Telephone Encounter (Signed)
Hydrodocone-Acetaminophen 5/325 mg Qty 21 Tablets  PATIENT USES [facility]

## 2019-11-26 ENCOUNTER — Other Ambulatory Visit: Payer: Self-pay | Admitting: Orthopedic Surgery

## 2019-11-26 DIAGNOSIS — M549 Dorsalgia, unspecified: Secondary | ICD-10-CM | POA: Diagnosis not present

## 2019-11-26 DIAGNOSIS — G8918 Other acute postprocedural pain: Secondary | ICD-10-CM

## 2019-11-26 DIAGNOSIS — K219 Gastro-esophageal reflux disease without esophagitis: Secondary | ICD-10-CM | POA: Diagnosis not present

## 2019-11-26 MED ORDER — HYDROCODONE-ACETAMINOPHEN 5-325 MG PO TABS: 1.0000 | ORAL_TABLET | Freq: Three times a day (TID) | ORAL | 0 refills | Status: DC | PRN

## 2019-11-26 NOTE — Telephone Encounter (Signed)
Patient requests refill on Hydrocodone/Acetaminophen 5-325  Mgs.  Qty  21  Sig: Take 1 tablet by mouth every 8 (eight) hours as needed for up to 7 days for moderate pain.  Patient states she uses [facility] 

## 2019-11-28 ENCOUNTER — Encounter: Payer: Self-pay | Admitting: Gastroenterology

## 2019-11-28 ENCOUNTER — Other Ambulatory Visit: Payer: Self-pay

## 2019-11-28 ENCOUNTER — Ambulatory Visit (INDEPENDENT_AMBULATORY_CARE_PROVIDER_SITE_OTHER): Payer: Medicare Other | Admitting: Gastroenterology

## 2019-11-28 VITALS — BP 130/76 | HR 91 | Temp 97.7°F | Ht 66.0 in | Wt 190.6 lb

## 2019-11-28 DIAGNOSIS — K7469 Other cirrhosis of liver: Secondary | ICD-10-CM

## 2019-11-28 DIAGNOSIS — R7989 Other specified abnormal findings of blood chemistry: Secondary | ICD-10-CM | POA: Diagnosis not present

## 2019-11-28 DIAGNOSIS — K8500 Idiopathic acute pancreatitis without necrosis or infection: Secondary | ICD-10-CM

## 2019-11-28 DIAGNOSIS — K219 Gastro-esophageal reflux disease without esophagitis: Secondary | ICD-10-CM | POA: Diagnosis not present

## 2019-11-28 NOTE — Patient Instructions (Signed)
Please have blood work done today.  We are arranging an upper endoscopy in the near future with Dr. Gala Romney.  We are ordering a special CT of your pancreas for about 6 weeks from now to ensure everything looks good.  We will see you in 6 months!   I enjoyed seeing you again today! As you know, I value our relationship and want to provide genuine, compassionate, and quality care. I welcome your feedback. If you receive a survey regarding your visit,  I greatly appreciate you taking time to fill this out. See you next time!  Annitta Needs, PhD, ANP-BC [facility]

## 2019-11-28 NOTE — Progress Notes (Signed)
Referring Provider: Rosita Fire, MD Primary Care Physician:  Rosita Fire, MD Primary GI: Dr. Gala Romney   Chief Complaint  Patient presents with  . Cirrhosis    f/u  . Pancreatitis    HFU. Has not had alcohol since hospitalization    HPI:   Colleen Lee is a 65 y.o. 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 Evaluated by Vascular previously with recommendations to return as needed.   Immune to Hep A. Completed Hep B vaccinations. EGD due this year. Reviewing ultrasounds, she had nodular contours last noted in 2018 but imaging thereafter with coarse liver, spleen appeared normal in March 2020. Thrombocytopenia from 2 years ago resolved. On last EGD in 2019, she did not have any stigmata of portal hypertension. Previously portal gastropathy noted in 2017. US abdomen on file from Feb 2021. Will pursue surveillance again in Aug 2021.    Recently inpatient March 2021 with acute uncomplicated pancreatitis, felt to be alcohol related. Unable to rule out biliary. Reviewed labs, and she had Tbili max of 6.4 while inpatient, transaminases as high as AST 296 and ALT 153. 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 She had drank the weekend of her sister's birthday prior to admission. No alcohol since that time. She is tolerating her diet. Abdominal pain resolved. She does tell me that her brother has a history of pancreatic cancer in his 42s. Discharge LFTs with AST 131, ALT 121, Tbili 2.8.   Constipation:  Trulance daily. Occasional constipation but not often. Took epsom salts and this resolved the episode. "did the job".   GERD: Protonix once per day. Controlled.   No rectal bleeding. Appetite is good.    Past Medical History:  Diagnosis Date  . Asthma   . Chronic abdominal pain   . Chronic back pain   . Chronic constipation   . Cirrhosis ([place])    Metavir score F4 on elastography 2015  . Cyclical vomiting   . Fibroids   . Fibromyalgia   . GERD (gastroesophageal reflux disease)   . H. pylori infection 2014   treated with pylera, had to be treated again as it was not eradicated. Urea breath test then negative after subsequent treatment.   . [name]    HCV RNA positive 09/2012  . Hypertension   . Marijuana use   . Nausea and vomiting    chronic, recurrent  . PONV (postoperative nausea and vomiting)    pt thinks maybe once she had N&V  . Sciatica of left side     Past Surgical History:  Procedure Laterality Date  . BALLOON DILATION  12/20/2017   Procedure: BALLOON DILATION;  Surgeon: Danie Binder, MD;  [facility];  Service: Endoscopy;;  pyloric dilation  . BIOPSY  12/20/2017   Procedure: BIOPSY;  Surgeon: Danie Binder, MD;  [facility];  Service: Endoscopy;;  duodenal and gastric biopsy  . COLONOSCOPY  01/18/2008   JOA:CZYSAY rectum.  Long redundant colon, a diminutive sigmoid polyp status post cold biopsy removed. Hyperplastic polyp. Repeat colonoscopy June 2014 due to family history of  colon cancer  . COLONOSCOPY WITH ESOPHAGOGASTRODUODENOSCOPY (EGD) N/A 11/02/2012   YPP:[id number] gastric mucosa of doubtful, +H.pylori. Incomplete colonoscopy due to patient unable to tolerate exam, proximal colon seen. Patient refused ACBE.  Marland Kitchen COLONOSCOPY WITH PROPOFOL N/A 03/08/2013   Dr. Gala Romney: colonic polyp-removed as scribed above. Internal Hemorrhoids. Pathology did not reveal any colonic tissue, only mucus. SURVEILLANCE DUE Aug 2019  . COLONOSCOPY WITH PROPOFOL  N/A 01/19/2018   Dr. Gala Romney: 6 mm cecal inflammatoy polyp, otherwise normal., Surveilance in 5 year s  . ESOPHAGEAL DILATION  12/20/2017   Procedure: ESOPHAGEAL DILATION;  Surgeon: Danie Binder, MD;  [facility];  Service: Endoscopy;;  . ESOPHAGOGASTRODUODENOSCOPY  01/18/2008   RMR: Normal esophagus, normal  stomach  . ESOPHAGOGASTRODUODENOSCOPY (EGD) WITH PROPOFOL N/A 02/09/2016   Normal esophagus, small hiatal hernia, portal hypertensive gastropathy, normal second portion of duodenum. Due in July 2019  . ESOPHAGOGASTRODUODENOSCOPY (EGD) WITH PROPOFOL N/A 12/20/2017   Dr. Oneida Alar: benign appearing esophageal stenosis s/p dilation, mild pyloric stenosis s/p biopsy and dilation, mild duodenitis.   Marland Kitchen HYSTEROSCOPY  05/11/2016   Procedure: HYSTEROSCOPY;  Surgeon: Jonnie Kind, MD;  Location: [facility];  Service: Gynecology;;  . KNEE SURGERY     left knee  . MULTIPLE EXTRACTIONS WITH ALVEOLOPLASTY N/A 10/15/2013   Procedure: MULTIPLE EXTRACTION WITH ALVEOLOPLASTY;  Surgeon: Gae Bon, DDS;  Location: [facility];  Service: Oral Surgery;  Laterality: N/A;  . POLYPECTOMY N/A 03/08/2013   Procedure: POLYPECTOMY;  Surgeon: Daneil Dolin, MD;  Location: [facility];  Service: Endoscopy;  Laterality: N/A;  . POLYPECTOMY N/A 05/11/2016   Procedure: REMOVAL OF ENDOMETRIAL POLYP;  Surgeon: Jonnie Kind, MD;  Location: [facility];  Service: Gynecology;  Laterality: N/A;  . RESECTION DISTAL CLAVICAL Right 01/30/2019   Procedure: RESECTION DISTAL CLAVICAL;  Surgeon: Carole Civil, MD;  Location: [facility];  Service: Orthopedics;  Laterality: Right;  . SHOULDER OPEN ROTATOR CUFF REPAIR Right 01/30/2019   Procedure: ROTATOR CUFF REPAIR SHOULDER OPEN;  Surgeon: Carole Civil, MD;  Location: [facility];  Service: Orthopedics;  Laterality: Right;  pt to arrive at 7:30 for PICC at 8:00  . TOTAL KNEE ARTHROPLASTY Left 02/05/2015   Procedure: LEFT TOTAL KNEE ARTHROPLASTY;  Surgeon: Carole Civil, MD;   Location: [facility];  Service: Orthopedics;  Laterality: Left;    Current Outpatient Medications  Medication Sig Dispense Refill  . albuterol (PROVENTIL HFA;VENTOLIN HFA) 108 (90 Base) MCG/ACT inhaler Inhale 1-2 puffs into the lungs every 6 (six) hours as needed for wheezing or shortness of breath.     . budesonide-formoterol (SYMBICORT) 160-4.5 MCG/ACT inhaler Inhale 2 puffs into the lungs in the morning and at bedtime.     . Cyanocobalamin (B-12) 2500 MCG TABS Take 2,500 mcg by mouth daily.    . cyclobenzaprine (FLEXERIL) 5 MG tablet Take 1 tablet (5 mg total) by mouth 3 (three) times daily as needed for muscle spasms. (Patient taking differently: Take 5 mg by mouth 2 (two) times daily as needed for muscle spasms. ) 42 tablet 2  . gabapentin (NEURONTIN) 100 MG capsule Take 1 capsule (100 mg total) by mouth 3 (three) times daily. (Patient taking differently: Take 100 mg by mouth at bedtime. ) 90 capsule 2  . hydrALAZINE (APRESOLINE) 50 MG tablet Take 1 tablet (50 mg total) by mouth every 8 (eight) hours. 90 tablet 1  . HYDROcodone-acetaminophen (NORCO/VICODIN) 5-325 MG tablet Take 1 tablet by mouth every 8 (eight) hours as needed for  up to 7 days for moderate pain. TAKE 1 TABLET BY MOUTH EVERY 8 HOURS AS NEEDED FOR UP TO 7 DAYS FOR MODERATE PAIN. 21 tablet 0  . hydrOXYzine (ATARAX/VISTARIL) 10 MG tablet Take 10 mg by mouth daily.     Marland Kitchen labetalol (NORMODYNE) 100 MG tablet Take 2 tablets (200 mg total) by mouth 2 (two) times daily. 120 tablet 1  . megestrol (MEGACE) 40 MG tablet Take 40 mg by mouth daily.    . ondansetron (ZOFRAN ODT) 4 MG disintegrating tablet Take 1 tablet (4 mg total) by mouth 3 (three) times daily. '4mg'$  ODT q4 hours prn nausea/vomit (Patient taking differently: Take 4 mg by mouth as needed for nausea or vomiting. ) 60 tablet 0  . pantoprazole (PROTONIX) 40 MG tablet Take 1 tablet (40 mg total) by mouth daily. 30 minutes before breakfast 90 tablet 3  . Plecanatide (TRULANCE) 3 MG TABS  Take 3 mg by mouth daily. 90 tablet 3   No current facility-administered medications for this visit.    Allergies as of 11/28/2019 - Review Complete 11/28/2019  Allergen Reaction Noted  . Penicillins Rash     Family History  Problem Relation Age of Onset  . Colon cancer Brother 55          . Multiple myeloma Brother   . Liver cancer Sister   . Prostate cancer Brother   . Pancreatic cancer Brother   . Cancer Mother        breast  . Asthma Mother     Social History   Socioeconomic History  . Marital status: Single    Spouse name: Not on file  . Number of children: 0  . Years of education: Not on file  . Highest education level: Not on file  Occupational History  . Occupation: umemployed   Tobacco Use  . Smoking status: Current Every Day Smoker    Packs/day: 1.00    Years: 40.00    Pack years: 40.00    Types: Cigarettes  . Smokeless tobacco: Never Used  . Tobacco comment: Smokes one pack of cigarettes daily  Substance and Sexual Activity  . Alcohol use: Not Currently    Alcohol/week: 1.0 standard drinks    Types: 1 Cans of beer per week    Comment: last drink 10/17/2019  . Drug use: Not Currently    Types: Marijuana    Comment: history of marijuana in past- almost 2 years ago  . Sexual activity: Never    Birth control/protection: None  Other Topics Concern  . Not on file  Social History Narrative  . Not on file   Social Determinants of Health   Financial Resource Strain:   . Difficulty of Paying Living Expenses:   Food Insecurity:   . Worried About Charity fundraiser in the Last Year:   . Arboriculturist in the Last Year:   Transportation Needs:   . Film/video editor (Medical):   Marland Kitchen Lack of Transportation (Non-Medical):   Physical Activity:   . Days of Exercise per Week:   . Minutes of Exercise per Session:   Stress:   . Feeling of Stress :   Social Connections:   . Frequency of Communication with Friends and Family:   . Frequency of Social  Gatherings with Friends and Family:   . Attends Religious Services:   . Active Member of Clubs or Organizations:   . Attends Archivist Meetings:   Marland Kitchen Marital Status:  Review of Systems: Gen: Denies fever, chills, anorexia. Denies fatigue, weakness, weight loss.  CV: Denies chest pain, palpitations, syncope, peripheral edema, and claudication. Resp: Denies dyspnea at rest, cough, wheezing, coughing up blood, and pleurisy. GI: see HPI Derm: Denies rash, itching, dry skin Psych: Denies depression, anxiety, memory loss, confusion. No homicidal or suicidal ideation.  Heme: Denies bruising, bleeding, and enlarged lymph nodes.  Physical Exam: BP 130/76   Pulse 91   Temp 97.7 F (36.5 C) (Oral)   Ht '5\' 6"'$  (1.676 m)   Wt 190 lb 9.6 oz (86.5 kg)   BMI 30.76 kg/m  General:   Alert and oriented. No distress noted. Pleasant and cooperative.  Head:  Normocephalic and atraumatic. Eyes:  Conjuctiva clear without scleral icterus. Mouth:  Mask in place Lungs: expiratory wheeze bilaterally Cardiac: S1 S2 present without murmurs Abdomen:  +BS, round and protuberant but soft. non-tender and non-distended. No rebound or guarding. No HSM or masses noted. Msk:  Symmetrical without gross deformities. Normal posture. Extremities:  Without edema. Neurologic:  Alert and  oriented x4 Psych:  Alert and cooperative. Normal mood and affect.  ASSESSMENT: Colleen Lee is a 64 y.o. female with a history of advanced Metavir score (4) in past and concern for cirrhosis due to Hep C/ETOH, successfully treated for Hep C and achieving SVR, chronic GERD and constipation, most recently inpatient with acute, uncomplicated pancreatitis, here for follow-up.  Advanced fibrosis/possible underlying cirrhosis: most recent US with coarsened texture, normal spleen size, EGD in 2019 without stigmata of portal hypertension, portal gastropathy noted in 2017. Does not appear to have advanced disease in setting of  normal platelet count, normal spleen, no stigmata of portal hypertension most recently. In setting of advanced fibrosis score and risk of progression to cirrhosis, we are following with serial ultrasounds and EGD for screening. Due for EGD now with Propofol.   GERD: controlled with Protonix daily  Constipation: continue Trulance  Pancreatitis: first episode, likely ETOH related. Review of LFTs with notable bump from baseline and consistent with likely alcoholic hepatitis as well. Doubt biliary but gallbladder remains. She has abstained from ETOH since hospitalization. She does have a family history of pancreatic cancer in her brother, age 41s. In light of this, will pursue CT with pancreatic protocol in next 6 weeks. Recheck HFP today.    PLAN:   Proceed with upper endoscopy in the near future with Dr. Gala Romney using PROPOFOL. The risks, benefits, and alternatives have been discussed in detail with patient. They have stated understanding and desire to proceed.   Recheck HFP today  Continue absolute ETOH abstinence  CT with pancreatic protocol in near future  Continue serial ultrasounds, with next due in August  Colonoscopy 2024  Return in 6 months   Annitta Needs, PhD, [facility]

## 2019-11-28 NOTE — Patient Instructions (Signed)
Pt already had PA for EGD since she was previously scheduled. PA# [id number], valid 11/12/19-02/10/20.

## 2019-11-28 NOTE — Patient Instructions (Signed)
No PA needed for CT abd w/wo contrast per [facility] website.

## 2019-11-29 NOTE — Progress Notes (Signed)
CC'ED TO PCP 

## 2019-12-03 ENCOUNTER — Other Ambulatory Visit: Payer: Self-pay

## 2019-12-03 DIAGNOSIS — G8918 Other acute postprocedural pain: Secondary | ICD-10-CM

## 2019-12-03 MED ORDER — HYDROCODONE-ACETAMINOPHEN 5-325 MG PO TABS: 1.0000 | ORAL_TABLET | Freq: Three times a day (TID) | ORAL | 0 refills | Status: DC | PRN

## 2019-12-03 NOTE — Telephone Encounter (Signed)
Hydrocodone -Acetaminophen 5/325 mg   Qty 21 Tablets  Take 1 tablet by mouth every 8 (eight) hours as needed for up to 7 days for moderate pain.       PATIENT USES [facility]

## 2019-12-07 ENCOUNTER — Other Ambulatory Visit: Payer: Self-pay | Admitting: Orthopedic Surgery

## 2019-12-07 DIAGNOSIS — Z9889 Other specified postprocedural states: Secondary | ICD-10-CM

## 2019-12-10 ENCOUNTER — Other Ambulatory Visit: Payer: Self-pay

## 2019-12-10 DIAGNOSIS — G8918 Other acute postprocedural pain: Secondary | ICD-10-CM

## 2019-12-10 MED ORDER — HYDROCODONE-ACETAMINOPHEN 5-325 MG PO TABS: 1.0000 | ORAL_TABLET | Freq: Three times a day (TID) | ORAL | 0 refills | Status: DC | PRN

## 2019-12-10 NOTE — Telephone Encounter (Signed)
Hydrocodone-Acetaminophen 5/325 mg  Qty 21 Tablets  TAKE 1 TABLET BY MOUTH EVERY 8 HOURS AS NEEDED FOR UP TO 7 DAYS FOR MODERATE PAIN.  PATIENT USES [facility]

## 2019-12-17 ENCOUNTER — Other Ambulatory Visit: Payer: Self-pay

## 2019-12-17 DIAGNOSIS — G8918 Other acute postprocedural pain: Secondary | ICD-10-CM

## 2019-12-17 MED ORDER — HYDROCODONE-ACETAMINOPHEN 5-325 MG PO TABS: 1.0000 | ORAL_TABLET | Freq: Three times a day (TID) | ORAL | 0 refills | Status: AC | PRN

## 2019-12-17 NOTE — Telephone Encounter (Signed)
Hydrocodone-Acetaminophen 5/325mg   Qty 28 tablets  Take 1 tablet by mouth every 8 (eight) hours as needed for up to 7 days for moderate pain.  PATIENT USES [facility]

## 2019-12-19 ENCOUNTER — Encounter: Payer: Self-pay | Admitting: Orthopedic Surgery

## 2019-12-19 ENCOUNTER — Ambulatory Visit: Payer: Medicare Other

## 2019-12-19 ENCOUNTER — Ambulatory Visit (INDEPENDENT_AMBULATORY_CARE_PROVIDER_SITE_OTHER): Payer: Medicare Other | Admitting: Orthopedic Surgery

## 2019-12-19 ENCOUNTER — Other Ambulatory Visit: Payer: Self-pay

## 2019-12-19 VITALS — Wt 188.0 lb

## 2019-12-19 DIAGNOSIS — M542 Cervicalgia: Secondary | ICD-10-CM

## 2019-12-19 DIAGNOSIS — R7989 Other specified abnormal findings of blood chemistry: Secondary | ICD-10-CM | POA: Diagnosis not present

## 2019-12-19 DIAGNOSIS — Z9889 Other specified postprocedural states: Secondary | ICD-10-CM

## 2019-12-19 DIAGNOSIS — G894 Chronic pain syndrome: Secondary | ICD-10-CM

## 2019-12-19 NOTE — Progress Notes (Signed)
Chief Complaint  Patient presents with  . Shoulder Pain    Recheck on right shoulder   Colleen Lee had surgery on her shoulder last visit was complaining of neck pain was treated with heat ibuprofen muscle relaxers and exercise she has chronic pain and receives hydrocodone for that Cuff repair right shoulder January 30, 2019  Her main problem today is that the neck is still hurting.  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  . Neck pain Yes  . S/P right rotator cuff repair 01/30/19   . Chronic pain syndrome

## 2019-12-19 NOTE — Patient Instructions (Addendum)
Continue the hydrocodone and the ibuprofen  Use heating pad on the neck  You will be scheduled for an MRI of the neck in [facility] because you are claustrophobic   Herniated Disk  A herniated disk, also called a ruptured disk or slipped disk, occurs when a disk in the spine bulges out too far. Between the bones in the spine (vertebrae), there are oval disks that are made of a soft, spongy center filled with liquid that is surrounded by a tough outer ring. The disks connect the vertebrae, help the spine move, and keep the bones from rubbing against each other when you move. When you have a herniated disk, the spongy center of the disk bulges out or breaks through the outer ring. The spongy center can press on a nerve between the vertebrae and cause pain. This can occur anywhere in the back or neck area, but the lower back is most commonly affected. What are the causes? This condition may be caused by:  Age-related wear and tear. The spongy centers of spinal disks tend to shrink and dry out with age, which makes them more likely to herniate.  Sudden injury, such as a strain or sprain. What increases the risk? The following factors may make you more likely to develop this condition:  Aging. This is the main risk factor for a herniated disk.  Being a man who is 77-46 years old.  Frequently doing activities that involve heavy lifting, bending, or twisting.  Not getting enough exercise.  Being overweight.  Using tobacco products. What are the signs or symptoms? Symptoms may vary depending on where your herniated disk is located.  A herniated disk in the lower back may cause sharp pain in: ? Part of the arm, leg, hip, or buttocks. ? The back of the lower leg (calf). ? The lower back, spreading down through the leg into the foot (sciatica).  A herniated disk in the neck may cause dizziness and vertigo. It may also cause pain or weakness in: ? The neck. ? The shoulder blades. ? The  upper arm, forearm, or fingers. You may also have muscle weakness. It may be difficult to:  Lift your leg or arm.  Stand on your toes.  Squeeze tightly with one of your hands. Other symptoms may include:  Numbness or tingling in the affected areas of the hands, arms, feet, or legs.  Inability to control when you urinate or when you have bowel movements. This is a rare but serious sign of a severe herniated disk in the lower back. How is this diagnosed? This condition may be diagnosed based on:  Your symptoms.  Your medical history.  A physical exam. The exam may include: ? A straight-leg test. For this test, you will lie on your back while your health care provider lifts your leg, keeping your knee straight. If you feel pain, you likely have a herniated disk. ? Neurologic tests. These include checking for numbness, reflexes, muscle strength, and problems with posture.  Imaging tests, such as: ? X-rays. ? MRI. ? CT scan. ? Electromyogram (EMG) to check the nerves that control muscles. This test may be used to determine which nerves are affected by your herniated disk. How is this treated? Treatment for this condition may include:  A short period of rest. This is usually the first treatment. ? You may be on bed rest for up to 2 days, or you may be instructed to stay home and avoid physical activity. ? If you  have a herniated disk in your lower back, avoid sitting as much as possible. Sitting increases pressure on the disk.  Medicines. These may include: ? NSAIDs, such as ibuprofen, to help reduce pain and swelling. ? Muscle relaxants to prevent sudden tightening of the back muscles (back spasms). ? Prescription pain medicines, if you have severe pain.  Ice or heat therapy.  Steroid injections in the area of the herniated disk. These can help reduce pain and swelling.  Physical therapy to strengthen your back muscles. In many cases, symptoms go away with treatment over a  period of days or weeks. You will most likely be free of symptoms after 3-4 months. If other treatments do not help to relieve your symptoms, you may need surgery. Follow these instructions at home: Medicines  Take over-the-counter and prescription medicines only as told by your health care provider.  Ask your health care provider if the medicine prescribed to you: ? Requires you to avoid driving or using heavy machinery. ? Can cause constipation. You may need to take these actions to prevent or treat constipation:  Drink enough fluid to keep your urine pale yellow.  Take over-the-counter or prescription medicines.  Eat foods that are high in fiber, such as beans, whole grains, and fresh fruits and vegetables.  Limit foods that are high in fat and processed sugars, such as fried or sweet foods. Managing pain, stiffness, and swelling      If directed, put ice on the painful area. Icing can help to relieve pain. To do this: ? Put ice in a plastic bag. ? Place a towel between your skin and the bag. ? Leave the ice on for 20 minutes, 2-3 times a day.  If directed, apply heat to the painful area as often as told by your health care provider. Heat can reduce the stiffness of your muscles. Use the heat source that your health care provider recommends, such as a moist heat pack or a heating pad. ? Place a towel between your skin and the heat source. ? Leave the heat on for 20-30 minutes. ? Remove the heat if your skin turns bright red. This is especially important if you are unable to feel pain, heat, or cold. You may have a greater risk of getting burned. Activity  Rest as told by your health care provider.  After your rest period: ? Return to your normal activities and gradually begin exercising as told by your health care provider. Ask your health care provider what activities and exercises are safe for you. ? Use good posture. ? Avoid movements that cause pain. ? Do not lift  anything that is heavier than 10 lb (4.5 kg), or the limit that you are told, until your health care provider says that it is safe. ? Do not sit or stand for long periods of time without changing positions. ? Do not sit for long periods of time without getting up and moving around.  If physical therapy was prescribed, do exercises as told by your health care provider.  Aim to strengthen muscles in your back and abdomen with exercises such as swimming or walking. General instructions  Do not use any products that contain nicotine or tobacco, such as cigarettes, e-cigarettes, and chewing tobacco. These products can delay healing. If you need help quitting, ask your health care provider.  Do not wear high-heeled shoes.  Do not sleep on your abdomen.  If you are overweight, work with your health care provider to lose  weight safely.  Keep all follow-up visits as told by your health care provider. This is important. How is this prevented?   Maintain a healthy weight.  Maintain physical fitness. Do at least 150 minutes of moderate-intensity exercise each week, such as brisk walking or water aerobics.  When lifting objects: ? Keep your feet at least shoulder-width apart and tighten the muscles of your abdomen. ? Keep your spine neutral as you bend your knees and hips. It is important to lift using the strength of your legs, not your back. Do not lock your knees straight out. ? Always ask for help to lift heavy or awkward objects. Contact a health care provider if you:  Have back pain or neck pain that does not get better after 6 weeks.  Have severe pain in your back, neck, legs, or arms.  Develop numbness, tingling, or weakness anywhere in your body. Get help right away if:  You cannot move your arms or legs.  You cannot control when you urinate or have bowel movements.  You feel dizzy or you faint.  You have shortness of breath. Summary  A herniated disk, also called a  ruptured disk or slipped disk, occurs when a disk in the spine bulges out too far (herniates).  This condition may be caused by age-related wear and tear or a sudden injury.  Symptoms may vary depending on where your herniated disk is located.  Treatment may include rest, medicines, ice or heat therapy, steroid injections, and physical therapy.  If other treatments do not help to relieve your symptoms, you may need surgery. This information is not intended to replace advice given to you by your health care provider. Make sure you discuss any questions you have with your health care provider. Document Revised: 02/20/2019 Document Reviewed: 02/20/2019 Elsevier Patient Education  [place].

## 2019-12-20 ENCOUNTER — Other Ambulatory Visit: Payer: Self-pay

## 2019-12-20 ENCOUNTER — Other Ambulatory Visit: Payer: Self-pay | Admitting: Gastroenterology

## 2019-12-20 DIAGNOSIS — E876 Hypokalemia: Secondary | ICD-10-CM

## 2019-12-20 LAB — COMPLETE METABOLIC PANEL WITH GFR
AG Ratio: 1.2 (calc) (ref 1.0–2.5)
ALT: 9 U/L (ref 6–29)
AST: 15 U/L (ref 10–35)
Albumin: 4.1 g/dL (ref 3.6–5.1)
Alkaline phosphatase (APISO): 70 U/L (ref 37–153)
BUN/Creatinine Ratio: 7 (calc) (ref 6–22)
BUN: 6 mg/dL — ABNORMAL LOW (ref 7–25)
CO2: 22 mmol/L (ref 20–32)
Calcium: 10.7 mg/dL — ABNORMAL HIGH (ref 8.6–10.4)
Chloride: 106 mmol/L (ref 98–110)
Creat: 0.92 mg/dL (ref 0.50–0.99)
GFR, Est African American: 76 mL/min/{1.73_m2} (ref >=60)
GFR, Est Non African American: 66 mL/min/{1.73_m2} (ref >=60)
Globulin: 3.3 g/dL (calc) (ref 1.9–3.7)
Glucose, Bld: 121 mg/dL (ref 65–139)
Potassium: 2.8 mmol/L — ABNORMAL LOW (ref 3.5–5.3)
Sodium: 140 mmol/L (ref 135–146)
Total Bilirubin: 0.6 mg/dL (ref 0.2–1.2)
Total Protein: 7.4 g/dL (ref 6.1–8.1)

## 2019-12-20 MED ORDER — POTASSIUM CHLORIDE ER 10 MEQ PO CPCR: 20.0000 meq | ORAL_CAPSULE | Freq: Two times a day (BID) | ORAL | 0 refills | Status: DC

## 2019-12-21 ENCOUNTER — Ambulatory Visit
Admission: RE | Admit: 2019-12-21 | Discharge: 2019-12-21 | Disposition: A | Discharge status: Home / Self Care | Payer: Medicare Other | Source: Ambulatory Visit | Attending: Orthopedic Surgery | Admitting: Orthopedic Surgery

## 2019-12-21 DIAGNOSIS — M4802 Spinal stenosis, cervical region: Secondary | ICD-10-CM | POA: Diagnosis not present

## 2019-12-21 DIAGNOSIS — M542 Cervicalgia: Secondary | ICD-10-CM

## 2019-12-24 ENCOUNTER — Other Ambulatory Visit: Payer: Self-pay | Admitting: Orthopedic Surgery

## 2019-12-24 NOTE — Telephone Encounter (Signed)
Patient requests refill on Hydrocodone/Acetaminophen 5-325  Mgs.  Qty  21  Sig: Take 1 tablet by mouth every 8 (eight) hours as needed for up to 7 days for moderate pain. TAKE 1 TABLET BY MOUTH EVERY 8 HOURS AS NEEDED FOR UP TO 7 DAYS FOR MODERATE PAIN.  Patient states she uses [facility]

## 2019-12-25 ENCOUNTER — Other Ambulatory Visit: Payer: Self-pay

## 2019-12-25 ENCOUNTER — Encounter: Payer: Self-pay | Admitting: Orthopedic Surgery

## 2019-12-25 ENCOUNTER — Ambulatory Visit (INDEPENDENT_AMBULATORY_CARE_PROVIDER_SITE_OTHER): Payer: Medicare Other | Admitting: Orthopedic Surgery

## 2019-12-25 DIAGNOSIS — M4802 Spinal stenosis, cervical region: Secondary | ICD-10-CM | POA: Diagnosis not present

## 2019-12-25 DIAGNOSIS — M542 Cervicalgia: Secondary | ICD-10-CM | POA: Diagnosis not present

## 2019-12-25 MED ORDER — HYDROCODONE-ACETAMINOPHEN 5-325 MG PO TABS: 1.0000 | ORAL_TABLET | Freq: Four times a day (QID) | ORAL | 0 refills | Status: DC | PRN

## 2019-12-25 NOTE — Progress Notes (Signed)
MRI F/U  Chief Complaint  Patient presents with  . Results    MRI results of MRI of C-spine   64 year old female with neck pain and right upper extremity radicular symptoms including numbness and tingling with weakness of the right upper extremity had an MRI of her cervical spine  C2-3: No spinal canal or neural foraminal stenosis.   C3-4: Small posterior disc protrusion without significant spinal canal stenosis. Uncovertebral and facet degenerative changes resulting mild right and moderate left neural foraminal narrowing.   C4-5: Posterior disc protrusion causing small indentation on the thecal sac without significant spinal canal stenosis. Uncovertebral and facet degenerative changes resulting moderate right and mild left neural foraminal narrowing.   C5-6: Loss of disc high, posterior disc protrusion resulting in mild spinal canal stenosis. Uncovertebral and facet degenerative changes resulting moderate to severe bilateral neural foraminal narrowing.   C6-7: Small posterior disc protrusion without significant spinal canal stenosis. Uncovertebral and facet degenerative changes resulting mild-to-moderate left neural foraminal narrowing.   C7-T1: No spinal canal or neural foraminal stenosis.   IMPRESSION: 1. Multilevel degenerative changes of the cervical spine as described above, worst at C5-6 where there is mild spinal canal stenosis and moderate to severe bilateral neural foraminal narrowing. 2. Moderate neural foraminal narrowing on the left at C3-4 and and on the right at C4-5.     Electronically Signed   By: Pedro Earls M.D.   On: 12/21/2019 12:40   As indicated in the report she has multilevel multifactorial spinal stenosis  Recommend neurosurgical referral.  Encounter Diagnoses  Name Primary?  . Neck pain Yes  . Spinal stenosis of cervical region

## 2019-12-26 DIAGNOSIS — I10 Essential (primary) hypertension: Secondary | ICD-10-CM | POA: Diagnosis not present

## 2019-12-26 DIAGNOSIS — K219 Gastro-esophageal reflux disease without esophagitis: Secondary | ICD-10-CM | POA: Diagnosis not present

## 2020-01-01 ENCOUNTER — Other Ambulatory Visit: Payer: Self-pay | Admitting: Orthopedic Surgery

## 2020-01-01 MED ORDER — HYDROCODONE-ACETAMINOPHEN 5-325 MG PO TABS: 1.0000 | ORAL_TABLET | Freq: Four times a day (QID) | ORAL | 0 refills | Status: DC | PRN

## 2020-01-01 NOTE — Telephone Encounter (Signed)
Patient requests refill on Hydrocodone/Acetaminophen 5-325  Qty 21  Sig: Take 1 tablet by mouth every 6 (six) hours as needed for moderate pain.  Patient states she uses [facility]

## 2020-01-07 ENCOUNTER — Other Ambulatory Visit: Payer: Self-pay | Admitting: Orthopedic Surgery

## 2020-01-07 DIAGNOSIS — Z6831 Body mass index (BMI) 31.0-31.9, adult: Secondary | ICD-10-CM | POA: Insufficient documentation

## 2020-01-07 DIAGNOSIS — M4722 Other spondylosis with radiculopathy, cervical region: Secondary | ICD-10-CM | POA: Diagnosis not present

## 2020-01-07 DIAGNOSIS — E876 Hypokalemia: Secondary | ICD-10-CM | POA: Diagnosis not present

## 2020-01-07 DIAGNOSIS — I10 Essential (primary) hypertension: Secondary | ICD-10-CM | POA: Diagnosis not present

## 2020-01-07 MED ORDER — HYDROCODONE-ACETAMINOPHEN 5-325 MG PO TABS: 1.0000 | ORAL_TABLET | Freq: Four times a day (QID) | ORAL | 0 refills | Status: DC | PRN

## 2020-01-07 NOTE — Telephone Encounter (Signed)
Patient requests refill:  HYDROcodone-acetaminophen (NORCO/VICODIN) 5-325 MG tablet 21 tablet  -[facility]  

## 2020-01-08 LAB — BASIC METABOLIC PANEL
BUN/Creatinine Ratio: 5 (calc) — ABNORMAL LOW (ref 6–22)
BUN: 6 mg/dL — ABNORMAL LOW (ref 7–25)
CO2: 19 mmol/L — ABNORMAL LOW (ref 20–32)
Calcium: 10.6 mg/dL — ABNORMAL HIGH (ref 8.6–10.4)
Chloride: 107 mmol/L (ref 98–110)
Creat: 1.1 mg/dL — ABNORMAL HIGH (ref 0.50–0.99)
Glucose, Bld: 101 mg/dL (ref 65–139)
Potassium: 3.1 mmol/L — ABNORMAL LOW (ref 3.5–5.3)
Sodium: 140 mmol/L (ref 135–146)

## 2020-01-09 ENCOUNTER — Other Ambulatory Visit: Payer: Self-pay | Admitting: Gastroenterology

## 2020-01-09 ENCOUNTER — Telehealth: Payer: Self-pay | Admitting: Internal Medicine

## 2020-01-09 MED ORDER — POTASSIUM CHLORIDE ER 10 MEQ PO CPCR: 20.0000 meq | ORAL_CAPSULE | Freq: Two times a day (BID) | ORAL | 0 refills | Status: DC

## 2020-01-09 NOTE — Telephone Encounter (Signed)
Pt is scheduled with [facility]MR to have EGD on 6/24. She said she couldn't do it this day and needs to reschedule. She was asking for 6/23 instead. I told her if she reschedule it would possibly be in September. [phone]

## 2020-01-09 NOTE — Telephone Encounter (Signed)
Tried to call pt at phone# listed in previous message. Doesn't ring, goes straight to recording that mailbox is full. Tried to call mobile# listed in chart, no answer, LMOVM for return call.

## 2020-01-09 NOTE — Telephone Encounter (Signed)
Tried to call pt, no answer, mailbox full.

## 2020-01-09 NOTE — Telephone Encounter (Signed)
Tried to call pt, no answer, unable to leave message d/t mailbox is full.

## 2020-01-10 NOTE — Telephone Encounter (Signed)
Called pt, VM full

## 2020-01-14 ENCOUNTER — Ambulatory Visit (HOSPITAL_COMMUNITY): Admission: RE | Admit: 2020-01-14 | Payer: Medicare Other | Source: Ambulatory Visit

## 2020-01-14 ENCOUNTER — Other Ambulatory Visit: Payer: Self-pay | Admitting: Orthopedic Surgery

## 2020-01-14 MED ORDER — HYDROCODONE-ACETAMINOPHEN 5-325 MG PO TABS: 1.0000 | ORAL_TABLET | Freq: Four times a day (QID) | ORAL | 0 refills | Status: DC | PRN

## 2020-01-14 NOTE — Telephone Encounter (Signed)
Letter mailed to pt.  

## 2020-01-14 NOTE — Telephone Encounter (Signed)
Patient requests refill on Hydrocodone/Acetaminophen 5-325  Mgs.  Qty  21 °  °Sig: Take 1 tablet by mouth every 6 (six) hours as needed for moderate pain. °  °Patient states she uses [facility] °

## 2020-01-16 NOTE — Patient Instructions (Signed)
88    Your procedure is scheduled on: 01/24/2020  Report to [facility] at     1:30 PM.  Call this number if you have problems the morning of surgery: [phone]   Remember:   Do not drink or eat food:After Midnight.        No Smoking the day of procedure      Take these medicines the morning of surgery with A SIP OF WATER: Hydralazine, Labetalol, pantoprazole, and valium and inhalers if needed   Do not wear jewelry, make-up or nail polish.  Do not wear lotions, powders, or perfumes. You may wear deodorant.                Do not bring valuables to the hospital.  Contacts, dentures or bridgework may not be worn into surgery.  Leave suitcase in the car. After surgery it may be brought to your room.  For patients admitted to the hospital, checkout time is 11:00 AM the day of discharge.   Patients discharged the day of surgery will not be allowed to drive home. Upper Endoscopy, Adult Upper endoscopy is a procedure to look inside the upper GI (gastrointestinal) tract. The upper GI tract is made up of:  The part of the body that moves food from your mouth to your stomach (esophagus).  The stomach.  The first part of your small intestine (duodenum). This procedure is also called esophagogastroduodenoscopy (EGD) or gastroscopy. In this procedure, your health care provider passes a thin, flexible tube (endoscope) through your mouth and down your esophagus into your stomach. A small camera is attached to the end of the tube. Images from the camera appear on a monitor in the exam room. During this procedure, your health care provider may also remove a small piece of tissue to be sent to a lab and examined under a microscope (biopsy). Your health care provider may do an upper endoscopy to diagnose cancers of the upper GI tract. You may also have this procedure to find the cause of other conditions, such as:  Stomach pain.  Heartburn.  Pain or problems when swallowing.  Nausea and  vomiting.  Stomach bleeding.  Stomach ulcers. Tell a health care provider about:  Any allergies you have.  All medicines you are taking, including vitamins, herbs, eye drops, creams, and over-the-counter medicines.  Any problems you or family members have had with anesthetic medicines.  Any blood disorders you have.  Any surgeries you have had.  Any medical conditions you have.  Whether you are pregnant or may be pregnant. What are the risks? Generally, this is a safe procedure. However, problems may occur, including:  Infection.  Bleeding.  Allergic reactions to medicines.  A tear or hole (perforation) in the esophagus, stomach, or duodenum. What happens before the procedure? Staying hydrated Follow instructions from your health care provider about hydration, which may include:  Up to 2 hours before the procedure - you may continue to drink clear liquids, such as water, clear fruit juice, black coffee, and plain tea.  Eating and drinking restrictions Follow instructions from your health care provider about eating and drinking, which may include:  8 hours before the procedure - stop eating heavy meals or foods, such as meat, fried foods, or fatty foods.  6 hours before the procedure - stop eating light meals or foods, such as toast or cereal.  6 hours before the procedure - stop drinking milk or drinks that contain milk.  2 hours before the  procedure - stop drinking clear liquids. Medicines Ask your health care provider about:  Changing or stopping your regular medicines. This is especially important if you are taking diabetes medicines or blood thinners.  Taking medicines such as aspirin and ibuprofen. These medicines can thin your blood. Do not take these medicines unless your health care provider tells you to take them.  Taking over-the-counter medicines, vitamins, herbs, and supplements. General instructions  Plan to have someone take you home from the  hospital or clinic.  If you will be going home right after the procedure, plan to have someone with you for 24 hours.  Ask your health care provider what steps will be taken to help prevent infection. What happens during the procedure?  1. An IV will be inserted into one of your veins. 2. You may be given one or more of the following: ? A medicine to help you relax (sedative). ? A medicine to numb the throat (local anesthetic). 3. You will lie on your left side on an exam table. 4. Your health care provider will pass the endoscope through your mouth and down your esophagus. 5. Your health care provider will use the scope to check the inside of your esophagus, stomach, and duodenum. Biopsies may be taken. 6. The endoscope will be removed. The procedure may vary among health care providers and hospitals. What happens after the procedure?  Your blood pressure, heart rate, breathing rate, and blood oxygen level will be monitored until you leave the hospital or clinic.  Do not drive for 24 hours if you were given a sedative during your procedure.  When your throat is no longer numb, you may be given some fluids to drink.  It is up to you to get the results of your procedure. Ask your health care provider, or the department that is doing the procedure, when your results will be ready. Summary  Upper endoscopy is a procedure to look inside the upper GI tract.  During the procedure, an IV will be inserted into one of your veins. You may be given a medicine to help you relax.  A medicine will be used to numb your throat.  The endoscope will be passed through your mouth and down your esophagus. This information is not intended to replace advice given to you by your health care provider. Make sure you discuss any questions you have with your health care provider. Document Revised: 01/11/2018 Document Reviewed: 12/19/2017 Elsevier Patient Education  [place] After  Please read the instructions outlined below and refer to this sheet in the next few weeks. These discharge instructions provide you with general information on caring for yourself after you leave the hospital. Your doctor may  also give you specific instructions. While your treatment has been planned according to the most current medical practices available, unavoidable complications occasionally occur. If you have any problems or questions after discharge, please call your doctor. HOME CARE INSTRUCTIONS Activity  You may resume your regular activity but move at a slower pace for the next 24 hours.   Take frequent rest periods for the next 24 hours.   Walking will help expel (get rid of) the air and reduce the bloated feeling in your abdomen.   No driving for 24 hours (because of the anesthesia (medicine) used during the test).   You may shower.   Do not sign any important legal documents or operate any machinery for 24 hours (because of the anesthesia used during the test).  Nutrition  Drink plenty of fluids.   You may resume your normal diet.   Begin with a light meal and progress to your normal diet.   Avoid alcoholic beverages for 24 hours or as instructed by your caregiver.  Medications You may resume your normal medications unless your caregiver tells you otherwise. What you can expect today  You may experience abdominal discomfort such as a feeling of fullness or "gas" pains.   You may experience a sore throat for 2 to 3 days. This is normal. Gargling with salt water may help this.  Follow-up Your doctor will discuss the results of your test with you. SEEK IMMEDIATE MEDICAL CARE IF:  You have excessive nausea (feeling sick to your stomach) and/or vomiting.   You have severe abdominal pain and distention (swelling).   You have trouble  swallowing.   You have a temperature over 100 F (37.8 C).   You have rectal bleeding or vomiting of blood.  Document Released: 03/02/2004 Document Revised: 07/08/2011 Document Reviewed: 09/13/2007

## 2020-01-18 NOTE — Telephone Encounter (Signed)
LMOVM for endo scheduler to cancel procedure d/t pt requested to reschedule and several attempts have been made to contact her.  FYI to AB.

## 2020-01-21 ENCOUNTER — Other Ambulatory Visit: Payer: Self-pay | Admitting: Orthopedic Surgery

## 2020-01-21 ENCOUNTER — Encounter (HOSPITAL_COMMUNITY)
Admission: RE | Admit: 2020-01-21 | Discharge: 2020-01-21 | Disposition: A | Discharge status: Home / Self Care | Payer: Medicare Other | Source: Ambulatory Visit | Attending: Internal Medicine | Admitting: Internal Medicine

## 2020-01-21 ENCOUNTER — Encounter (HOSPITAL_COMMUNITY): Payer: Self-pay

## 2020-01-21 ENCOUNTER — Other Ambulatory Visit (HOSPITAL_COMMUNITY): Payer: Medicare Other

## 2020-01-21 MED ORDER — HYDROCODONE-ACETAMINOPHEN 5-325 MG PO TABS: 1.0000 | ORAL_TABLET | Freq: Four times a day (QID) | ORAL | 0 refills | Status: DC | PRN

## 2020-01-21 NOTE — Telephone Encounter (Signed)
Patient requests refill on Hydrocodone/Acetaminophen 5-325  Mgs.  Qty  21      Sig: Take 1 tablet by mouth every 8 (eight) hours as needed for up to 7 days for moderate pain. TAKE 1 TABLET BY MOUTH EVERY 6 HOURS AS NEEDED FOR UP TO 7 DAYS FOR MODERATE PAIN.       Patient states she uses [facility]

## 2020-01-24 ENCOUNTER — Ambulatory Visit (HOSPITAL_COMMUNITY): Admission: RE | Admit: 2020-01-24 | Payer: Medicare Other | Source: Home / Self Care | Admitting: Internal Medicine

## 2020-01-24 ENCOUNTER — Encounter (HOSPITAL_COMMUNITY): Admission: RE | Payer: Self-pay | Source: Home / Self Care

## 2020-01-24 SURGERY — ESOPHAGOGASTRODUODENOSCOPY (EGD) WITH PROPOFOL
Anesthesia: Monitor Anesthesia Care

## 2020-01-26 DIAGNOSIS — I10 Essential (primary) hypertension: Secondary | ICD-10-CM | POA: Diagnosis not present

## 2020-01-26 DIAGNOSIS — J449 Chronic obstructive pulmonary disease, unspecified: Secondary | ICD-10-CM | POA: Diagnosis not present

## 2020-01-28 ENCOUNTER — Other Ambulatory Visit: Payer: Self-pay | Admitting: Orthopedic Surgery

## 2020-01-28 MED ORDER — HYDROCODONE-ACETAMINOPHEN 5-325 MG PO TABS: 1.0000 | ORAL_TABLET | Freq: Four times a day (QID) | ORAL | 0 refills | Status: DC | PRN

## 2020-01-28 NOTE — Telephone Encounter (Signed)
Patient called for refill  HYDROcodone-acetaminophen (NORCO/VICODIN) 5-325 MG tablet 21 tablet  -[facility]   -referral to neurosurgery/no follow up appointment scheduled at this time

## 2020-02-05 ENCOUNTER — Other Ambulatory Visit: Payer: Self-pay | Admitting: Orthopedic Surgery

## 2020-02-05 MED ORDER — HYDROCODONE-ACETAMINOPHEN 5-325 MG PO TABS: 1.0000 | ORAL_TABLET | Freq: Four times a day (QID) | ORAL | 0 refills | Status: DC | PRN

## 2020-02-05 NOTE — Telephone Encounter (Signed)
Patient called for refill: HYDROcodone-acetaminophen (NORCO/VICODIN) 5-325 MG tablet 21 tablet  -[facility]  -states Dr Aline Brochure is aware she has seen neurosurgeon per referral.

## 2020-02-11 ENCOUNTER — Other Ambulatory Visit: Payer: Self-pay | Admitting: Orthopedic Surgery

## 2020-02-11 MED ORDER — HYDROCODONE-ACETAMINOPHEN 5-325 MG PO TABS: 1.0000 | ORAL_TABLET | Freq: Four times a day (QID) | ORAL | 0 refills | Status: DC | PRN

## 2020-02-11 NOTE — Telephone Encounter (Signed)
Patient requests refill on Hydrocodone/Acetaminophen 5-325  Mgs.  Qty  21 °  °Sig: Take 1 tablet by mouth every 6 (six) hours as needed for moderate pain. °  °Patient states she uses [facility] °

## 2020-02-12 ENCOUNTER — Other Ambulatory Visit: Payer: Self-pay

## 2020-02-12 ENCOUNTER — Ambulatory Visit (HOSPITAL_COMMUNITY): Payer: Medicare Other | Attending: Neurosurgery | Admitting: Physical Therapy

## 2020-02-12 ENCOUNTER — Encounter (HOSPITAL_COMMUNITY): Payer: Self-pay | Admitting: Physical Therapy

## 2020-02-12 DIAGNOSIS — M542 Cervicalgia: Secondary | ICD-10-CM | POA: Insufficient documentation

## 2020-02-12 DIAGNOSIS — R29898 Other symptoms and signs involving the musculoskeletal system: Secondary | ICD-10-CM | POA: Diagnosis not present

## 2020-02-12 DIAGNOSIS — M25611 Stiffness of right shoulder, not elsewhere classified: Secondary | ICD-10-CM | POA: Diagnosis not present

## 2020-02-12 DIAGNOSIS — M25511 Pain in right shoulder: Secondary | ICD-10-CM | POA: Insufficient documentation

## 2020-02-12 NOTE — Patient Instructions (Addendum)
Axial Extension (Chin Tuck)    Pull chin in and lengthen back of neck. Hold __3__ seconds while counting out loud. Repeat __10__ times. Do _1-2___ sessions per day.  http://gt2.exer.us/450   Copyright  VHI. All rights reserved.

## 2020-02-12 NOTE — Therapy (Signed)
[place], [place], [address] Phone: [phone]   Fax:  [phone]  Physical Therapy Evaluation  Patient Details  Name: MIGUEL MEDAL MRN: [id number] Date of Birth: October 30, 1955 Referring Provider (PT): Ashok Pall, MD   Encounter Date: 02/12/2020   PT End of Session - 02/12/20 1542    Visit Number 1    Number of Visits 12    Date for PT Re-Evaluation 03/25/20    Authorization Type Primary: [employer] Medicare; Secondary: Medicaid    Progress Note Due on Visit 10    PT Start Time 1430    PT Stop Time 1510    PT Time Calculation (min) 40 min    Activity Tolerance Patient tolerated treatment well    Behavior During Therapy [facility] for tasks assessed/performed           Past Medical History:  Diagnosis Date  . Asthma   . Chronic abdominal pain   . Chronic back pain   . Chronic constipation   . Cirrhosis ([place])    Metavir score F4 on elastography 2015  . Cyclical vomiting   . Fibroids   . Fibromyalgia   . GERD (gastroesophageal reflux disease)   . H. pylori infection 2014   treated with pylera, had to be treated again as it was not eradicated. Urea breath test then negative after subsequent treatment.   . [name]    HCV RNA positive 09/2012  . Hypertension   . Marijuana use   . Nausea and vomiting    chronic, recurrent  . PONV (postoperative nausea and vomiting)    pt thinks maybe once she had N&V  . Sciatica of left side     Past Surgical History:  Procedure Laterality Date  . BALLOON DILATION  12/20/2017   Procedure: BALLOON DILATION;  Surgeon: Danie Binder, MD;  [facility];  Service: Endoscopy;;  pyloric dilation  . BIOPSY  12/20/2017   Procedure: BIOPSY;  Surgeon: Danie Binder, MD;  [facility];  Service: Endoscopy;;  duodenal and gastric biopsy  . COLONOSCOPY  01/18/2008   FBP:ZWCHEN rectum.  Long redundant colon, a diminutive sigmoid polyp status post cold biopsy  removed. Hyperplastic polyp. Repeat colonoscopy June 2014 due to family history of colon cancer  . COLONOSCOPY WITH ESOPHAGOGASTRODUODENOSCOPY (EGD) N/A 11/02/2012   IDP:OEUMPNTI gastric mucosa of doubtful, +H.pylori. Incomplete colonoscopy due to patient unable to tolerate exam, proximal colon seen. Patient refused ACBE.  Marland Kitchen COLONOSCOPY WITH PROPOFOL N/A 03/08/2013   Dr. Gala Romney: colonic polyp-removed as scribed above. Internal Hemorrhoids. Pathology did not reveal any colonic tissue, only mucus. SURVEILLANCE DUE Aug 2019  . COLONOSCOPY WITH PROPOFOL N/A 01/19/2018   Dr. Gala Romney: 6 mm cecal inflammatoy polyp, otherwise normal., Surveilance in 5 year s  . ESOPHAGEAL DILATION  12/20/2017   Procedure: ESOPHAGEAL DILATION;  Surgeon: Danie Binder, MD;  [facility];  Service: Endoscopy;;  . ESOPHAGOGASTRODUODENOSCOPY  01/18/2008   RMR: Normal esophagus, normal  stomach  . ESOPHAGOGASTRODUODENOSCOPY (EGD) WITH PROPOFOL N/A 02/09/2016   Normal esophagus, small hiatal hernia, portal hypertensive gastropathy, normal second portion of duodenum. Due in July 2019  . ESOPHAGOGASTRODUODENOSCOPY (EGD) WITH PROPOFOL N/A 12/20/2017   Dr. Oneida Alar: benign appearing esophageal stenosis s/p dilation, mild pyloric stenosis s/p biopsy and dilation, mild duodenitis.   Marland Kitchen HYSTEROSCOPY  05/11/2016   Procedure: HYSTEROSCOPY;  Surgeon: Jonnie Kind, MD;  Location: [facility];  Service: Gynecology;;  . KNEE  SURGERY     left knee  . MULTIPLE EXTRACTIONS WITH ALVEOLOPLASTY N/A 10/15/2013   Procedure: MULTIPLE EXTRACTION WITH ALVEOLOPLASTY;  Surgeon: Gae Bon, DDS;  Location: [facility];  Service: Oral Surgery;  Laterality: N/A;  . POLYPECTOMY N/A 03/08/2013   Procedure: POLYPECTOMY;  Surgeon: Daneil Dolin, MD;  Location: [facility];  Service: Endoscopy;  Laterality: N/A;  . POLYPECTOMY N/A 05/11/2016   Procedure: REMOVAL OF ENDOMETRIAL POLYP;  Surgeon: Jonnie Kind, MD;  Location: [facility];  Service: Gynecology;  Laterality:  N/A;  . RESECTION DISTAL CLAVICAL Right 01/30/2019   Procedure: RESECTION DISTAL CLAVICAL;  Surgeon: Carole Civil, MD;  Location: [facility];  Service: Orthopedics;  Laterality: Right;  . SHOULDER OPEN ROTATOR CUFF REPAIR Right 01/30/2019   Procedure: ROTATOR CUFF REPAIR SHOULDER OPEN;  Surgeon: Carole Civil, MD;  Location: [facility];  Service: Orthopedics;  Laterality: Right;  pt to arrive at 7:30 for PICC at 8:00  . TOTAL KNEE ARTHROPLASTY Left 02/05/2015   Procedure: LEFT TOTAL KNEE ARTHROPLASTY;  Surgeon: Carole Civil, MD;  Location: [facility];  Service: Orthopedics;  Laterality: Left;    There were no vitals filed for this visit.    Subjective Assessment - 02/12/20 1435    Pertinent History History of Right RTC repair 01/2019.    Limitations House hold activities;Lifting    Diagnostic tests MRI of cervical spine    Patient Stated Goals To have less neck pain    Currently in Pain? 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 2    Has the patient had a decrease in activity level because of a fear of falling?  No    Is the patient reluctant to leave their home because of a fear of falling?   No      Home Environment   Living Environment Private residence    Living Arrangements Spouse/significant other    Type of [place] One level      Prior Function   Level of Independence Independent    Vocation On disability      Cognition   Overall Cognitive Status Within Functional Limits for tasks assessed      Observation/Other Assessments   Focus on Therapeutic Outcomes (FOTO)  28%      ROM / Strength   AROM / PROM / Strength  AROM      AROM   Overall AROM Comments RT shoulder flexion 131 degrees AROM, RT shoulder abduction 103 degrees. LT shoulder flexion and abduction WFL.     AROM Assessment Site Cervical    Cervical Flexion 30 - painful in posterior cervical region    Cervical Extension 28 - painful in posterior cervical spine region    Cervical - Right Side Bend 18 - painful in right cervical spine region    Cervical - Left Side Bend 23 - painful in left cervical spine    Cervical - Right Rotation 60 - Not as painful     Cervical - Left Rotation 43 - painful on the left side      Palpation   Palpation comment Patient denied tenderness to palpation of spinous process of cervical spine. Patient reported tenderness to bilateral trapezius and levator scapulae and cervical parapspinals                      Objective measurements completed on examination: See above findings.       [facility] Adult PT Treatment/Exercise - 02/12/20 0001      Exercises   Exercises Neck      Neck Exercises: Seated   Neck Retraction 10 reps    Neck Retraction Limitations 3'' holds. Cues for form.                   PT Education - 02/12/20 1542    Education Details Discussed examination findings, POC, and initial HEP.    Person(s) Educated Patient    Methods Explanation;Handout;Verbal cues    Comprehension Verbalized understanding            PT Short Term Goals - 02/12/20 1544      PT SHORT TERM GOAL #1   Title Patient will report  understanding and regular compliance with HEP to improve mobility, improve stability, and decrease pain.    Time 3    Period Weeks    Status New    Target Date 03/04/20      PT SHORT TERM GOAL #2   Title Patient will report improvement in overall subjective complaint of at least 25% for improved QoL.    Time 3    Period Weeks    Status New    Target Date 03/04/20             PT Long Term Goals - 02/12/20 1544      PT LONG TERM GOAL #1   Title Patient will demonstrate WFL AROM of RT shoulder to improve functional mobility and decrease comensatory movements affecting the cervical spine.    Time 6    Period Weeks    Status New    Target Date 03/25/20      PT LONG TERM GOAL #2   Title Patient will demonstrate cervical AROM WFL in order to observe the environment with improved ease and decreased pain.    Time 6    Period Weeks    Status New    Target Date 03/25/20      PT LONG TERM GOAL #3   Title Patient will demonstrate improvement on FOTO of at least 10% indicating improved overall functional mobility.    Time 6    Period Weeks    Status New    Target Date 03/25/20      PT LONG TERM GOAL #4   Title Patient will report improvement in overall subjective complaint of at least  50% for improved QoL.    Time 6    Period Weeks    Status New    Target Date 03/25/20                  Plan - 02/12/20 1636    Clinical Impression Statement Patient is a 64 year old woman who presents to outpatient physical therapy with primary complaint of neck pain which has been ongoing for the last several months. Patient has a surgical history significant for RT rotator cuff repair in June of 2020. Patient demonstrates decreased RT shoulder AROM as well as decreased cervical AROM. Patient demonstrates decreased activity tolerance and with palpation noted significant tenderness to upper trapezius and levator scapulae bilaterally. Patient is likely compensating for decreased right  shoulder mobility with neck musculature leading to tenderness in this area. Patient did report having tingling and numbness in her hands and feet, but her MRI did not suggest compression which might explain this. Patient would benefit from continued skilled physical therapy to address the abovementioned deficits and help patient return to her PLOF.    Personal Factors and Comorbidities Age;Time since onset of injury/illness/exacerbation;Comorbidity 3+    Comorbidities HTN, Fibromyalgia, Hx of RT shoulder RTC repair June 2020    Examination-Activity Limitations Lift;Reach Overhead;Carry;Dressing;Hygiene/Grooming    Examination-Participation Restrictions Cleaning;Meal Prep;Community Activity;Shop    Stability/Clinical Decision Making Evolving/Moderate complexity    Clinical Decision Making Moderate    Rehab Potential Fair    PT Frequency 2x / week    PT Duration 6 weeks    PT Treatment/Interventions ADLs/Self Care Home Management;Aquatic Therapy;Cryotherapy;Electrical Stimulation;Moist Heat;Traction;DME Instruction;Gait training;Stair training;Functional mobility training;Therapeutic activities;Therapeutic exercise;Balance training;Neuromuscular re-education;Patient/family education;Orthotic Fit/Training;Manual techniques;Passive range of motion;Dry needling;Energy conservation;Taping    PT Next Visit Plan Review HEP. Perform PROM to RT shoulder and cervical spine for improve mobility. Focus on improving RT shoulder mobility, STM to traps & levator. Continue to monitor reports of tingling/numbness in hands and feet as needed.    PT Home Exercise Plan 02/12/20: Cervical retraction 10x    Consulted and Agree with Plan of Care Patient           Patient will benefit from skilled therapeutic intervention in order to improve the following deficits and impairments:  Increased fascial restricitons, Pain, Decreased mobility, Decreased activity tolerance, Decreased endurance, Decreased range of motion,  Decreased strength, Hypomobility, Impaired UE functional use  Visit Diagnosis: Cervicalgia  Stiffness of right shoulder, not elsewhere classified  Other symptoms and signs involving the musculoskeletal system     Problem List Patient Active Problem List   Diagnosis Date Noted  . Acute pancreatitis 10/17/2019  . ETOH abuse 10/17/2019  . S/P right rotator cuff repair 01/30/19 02/06/2019  . Nontraumatic complete tear of right rotator cuff   . Arthritis of right acromioclavicular joint   . S/P total knee replacement, 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/24/2018  . Shoulder impingement, right 05/24/2018  . Dehydration   . Intractable cyclical vomiting 24/04/7352  . Pyloric stenosis in adult   . Thrombocytopenia ([place]) 12/20/2017  . Leukocytosis   . Malnutrition of moderate degree 12/17/2017  . Acute renal failure (ARF) ([place]) 12/15/2017  . Chronic abdominal pain 12/15/2017  . Gastroenteritis 06/14/2016  . Nausea with vomiting 06/10/2016  . Uterine enlargement 06/09/2016  . Intractable nausea and vomiting 06/08/2016  . Acute infective gastroenteritis 06/08/2016  . Diarrhea 06/08/2016  . Essential hypertension 06/08/2016  . GERD (gastroesophageal reflux disease) 06/08/2016  . Abdominal pain   . Endometrial polyp 04/15/2016  .  PMB (postmenopausal bleeding) 02/27/2016  . Alcoholic cirrhosis of liver without ascites ([place])   . Asthma 01/21/2016  . Hepatic cirrhosis ([place]) 09/22/2015  . Arthritis of knee, degenerative 02/05/2015  . History of Helicobacter pylori infection 10/30/2014  . Chronic [name] with cirrhosis ([place]) 04/11/2014  . De Quervain's disease (radial styloid tenosynovitis) 12/25/2013  . Anorexia 11/21/2012  . FH: colon cancer 11/21/2012  . Early satiety 10/25/2012  . Bowel habit changes 10/25/2012  . Abdominal pain, epigastric 10/25/2012  . Abdominal bloating 10/25/2012  . Constipation 10/25/2012  . Abnormal weight loss 10/25/2012  . Chronic viral [name] ([place]) 10/25/2012  .  Radicular pain of left lower extremity 09/28/2012  . Back pain 09/28/2012  . Sciatica 08/10/2011  . S/P arthroscopy of left knee 08/10/2011  . Tibial plateau fracture 08/10/2011  . Pain in joint, lower leg 02/12/2011  . Stiffness of joint, not elsewhere classified, lower leg 02/12/2011  . 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 Meniscus, medial, derangement 12/29/2010  . CLOSED FRACTURE OF UPPER END OF TIBIA 08/12/2010   Clarene Critchley PT, DPT 4:43 PM, 02/12/20 [place], [place], [address] Phone: [phone]   Fax:  [phone]  Name: DANAHI REDDISH MRN: [id number] Date of Birth: 1955/12/26

## 2020-02-15 ENCOUNTER — Ambulatory Visit (HOSPITAL_COMMUNITY): Payer: Medicare Other

## 2020-02-15 ENCOUNTER — Encounter (HOSPITAL_COMMUNITY): Payer: Self-pay

## 2020-02-15 ENCOUNTER — Other Ambulatory Visit: Payer: Self-pay

## 2020-02-15 DIAGNOSIS — R29898 Other symptoms and signs involving the musculoskeletal system: Secondary | ICD-10-CM | POA: Diagnosis not present

## 2020-02-15 DIAGNOSIS — M25511 Pain in right shoulder: Secondary | ICD-10-CM | POA: Diagnosis not present

## 2020-02-15 DIAGNOSIS — M542 Cervicalgia: Secondary | ICD-10-CM | POA: Diagnosis not present

## 2020-02-15 DIAGNOSIS — M25611 Stiffness of right shoulder, not elsewhere classified: Secondary | ICD-10-CM

## 2020-02-15 NOTE — Therapy (Signed)
[place], [place], [address] Phone: [phone]   Fax:  [phone]  Physical Therapy Treatment  Patient Details  Name: Colleen Lee MRN: [id number] Date of Birth: [birth year] Referring Provider (PT): Ashok Pall, MD   Encounter Date: 02/15/2020   PT End of Session - 02/15/20 1301    Visit Number 2    Number of Visits 12    Date for PT Re-Evaluation 03/25/20    Authorization Type Primary: [employer] Medicare; Secondary: Medicaid    Progress Note Due on Visit 10    PT Start Time 1302    PT Stop Time 1344    PT Time Calculation (min) 42 min    Activity Tolerance Patient tolerated treatment well;No increased pain    Behavior During Therapy WFL for tasks assessed/performed           Past Medical History:  Diagnosis Date  . Asthma   . Chronic abdominal pain   . Chronic back pain   . Chronic constipation   . Cirrhosis ([place])    Metavir score F4 on elastography 2015  . Cyclical vomiting   . Fibroids   . Fibromyalgia   . GERD (gastroesophageal reflux disease)   . H. pylori infection 2014   treated with pylera, had to be treated again as it was not eradicated. Urea breath test then negative after subsequent treatment.   . [name]    HCV RNA positive 09/2012  . Hypertension   . Marijuana use   . Nausea and vomiting    chronic, recurrent  . PONV (postoperative nausea and vomiting)    pt thinks maybe once she had N&V  . Sciatica of left side     Past Surgical History:  Procedure Laterality Date  . BALLOON DILATION  12/20/2017   Procedure: BALLOON DILATION;  Surgeon: Danie Binder, MD;  [facility];  Service: Endoscopy;;  pyloric dilation  . BIOPSY  12/20/2017   Procedure: BIOPSY;  Surgeon: Danie Binder, MD;  [facility];  Service: Endoscopy;;  duodenal and gastric biopsy  . COLONOSCOPY  01/18/2008   SAY:TKZSWF rectum.  Long redundant colon, a diminutive sigmoid polyp status  post cold biopsy removed. Hyperplastic polyp. Repeat colonoscopy June 2014 due to family history of colon cancer  . COLONOSCOPY WITH ESOPHAGOGASTRODUODENOSCOPY (EGD) N/A 11/02/2012   UXN:ATFTDDUK gastric mucosa of doubtful, +H.pylori. Incomplete colonoscopy due to patient unable to tolerate exam, proximal colon seen. Patient refused ACBE.  Marland Kitchen COLONOSCOPY WITH PROPOFOL N/A 03/08/2013   Dr. Gala Romney: colonic polyp-removed as scribed above. Internal Hemorrhoids. Pathology did not reveal any colonic tissue, only mucus. SURVEILLANCE DUE Aug 2019  . COLONOSCOPY WITH PROPOFOL N/A 01/19/2018   Dr. Gala Romney: 6 mm cecal inflammatoy polyp, otherwise normal., Surveilance in 5 year s  . ESOPHAGEAL DILATION  12/20/2017   Procedure: ESOPHAGEAL DILATION;  Surgeon: Danie Binder, MD;  [facility];  Service: Endoscopy;;  . ESOPHAGOGASTRODUODENOSCOPY  01/18/2008   RMR: Normal esophagus, normal  stomach  . ESOPHAGOGASTRODUODENOSCOPY (EGD) WITH PROPOFOL N/A 02/09/2016   Normal esophagus, small hiatal hernia, portal hypertensive gastropathy, normal second portion of duodenum. Due in July 2019  . ESOPHAGOGASTRODUODENOSCOPY (EGD) WITH PROPOFOL N/A 12/20/2017   Dr. Oneida Alar: benign appearing esophageal stenosis s/p dilation, mild pyloric stenosis s/p biopsy and dilation, mild duodenitis.   Marland Kitchen HYSTEROSCOPY  05/11/2016   Procedure: HYSTEROSCOPY;  Surgeon: Jonnie Kind, MD;  Location: [facility];  Service: Gynecology;;  .  KNEE SURGERY     left knee  . MULTIPLE EXTRACTIONS WITH ALVEOLOPLASTY N/A 10/15/2013   Procedure: MULTIPLE EXTRACTION WITH ALVEOLOPLASTY;  Surgeon: Gae Bon, DDS;  Location: [name];  Service: Oral Surgery;  Laterality: N/A;  . POLYPECTOMY N/A 03/08/2013   Procedure: POLYPECTOMY;  Surgeon: Daneil Dolin, MD;  Location: [facility];  Service: Endoscopy;  Laterality: N/A;  . POLYPECTOMY N/A 05/11/2016   Procedure: REMOVAL OF ENDOMETRIAL POLYP;  Surgeon: Jonnie Kind, MD;  Location: [facility];  Service:  Gynecology;  Laterality: N/A;  . RESECTION DISTAL CLAVICAL Right 01/30/2019   Procedure: RESECTION DISTAL CLAVICAL;  Surgeon: Carole Civil, MD;  Location: [facility];  Service: Orthopedics;  Laterality: Right;  . SHOULDER OPEN ROTATOR CUFF REPAIR Right 01/30/2019   Procedure: ROTATOR CUFF REPAIR SHOULDER OPEN;  Surgeon: Carole Civil, MD;  Location: [facility];  Service: Orthopedics;  Laterality: Right;  pt to arrive at 7:30 for PICC at 8:00  . TOTAL KNEE ARTHROPLASTY Left 02/05/2015   Procedure: LEFT TOTAL KNEE ARTHROPLASTY;  Surgeon: Carole Civil, MD;  Location: [facility];  Service: Orthopedics;  Laterality: Left;    There were no vitals filed for this visit.   Subjective Assessment - 02/15/20 1305    Subjective Pt reports difficulty with HEP at home. Pt reports no pain today.    Pertinent History History of Right RTC repair 01/2019.    Limitations House hold activities;Lifting    Diagnostic tests MRI of cervical spine    Patient Stated Goals To have less neck pain    Currently in Pain? No/denies                [facility] Adult PT Treatment/Exercise - 02/15/20 0001      Neck Exercises: Seated   Shoulder Rolls Backwards;10 reps    Other Seated Exercise cervical flex/ext, rotate R/L, sidebend R/L x10 reps each all within pain free ROM    Other Seated Exercise scapular retraction, x15 reps      Neck Exercises: Supine   Neck Retraction 10 reps;3 secs    Neck Retraction Limitations 2 sets; chin tucks, heavy cues      Manual Therapy   Manual Therapy Soft tissue mobilization;Passive ROM    Manual therapy comments completed seperately from rest of treatment interventions    Soft tissue mobilization pt seated, STM to bil traps (upper and mid) with R>L more tension and pain noted    Passive ROM R shoulder flexion and abduction, within pain-free range                  PT Education - 02/15/20 1301    Education Details Reviewed goals, HEP. Exercise technique, updated HEP     Person(s) Educated Patient    Methods Explanation;Demonstration;Handout    Comprehension Verbalized understanding;Returned demonstration            PT Short Term Goals - 02/15/20 1302      PT SHORT TERM GOAL #1   Title Patient will report understanding and regular compliance with HEP to improve mobility, improve stability, and decrease pain.    Time 3    Period Weeks    Status On-going    Target Date 03/04/20      PT SHORT TERM GOAL #2   Title Patient will report improvement in overall subjective complaint of at least 25% for improved QoL.    Time 3    Period Weeks    Status On-going    Target  Date 03/04/20             PT Long Term Goals - 02/15/20 1306      PT LONG TERM GOAL #1   Title Patient will demonstrate WFL AROM of RT shoulder to improve functional mobility and decrease comensatory movements affecting the cervical spine.    Time 6    Period Weeks    Status On-going      PT LONG TERM GOAL #2   Title Patient will demonstrate cervical AROM WFL in order to observe the environment with improved ease and decreased pain.    Time 6    Period Weeks    Status On-going      PT LONG TERM GOAL #3   Title Patient will demonstrate improvement on FOTO of at least 10% indicating improved overall functional mobility.    Time 6    Period Weeks    Status On-going      PT LONG TERM GOAL #4   Title Patient will report improvement in overall subjective complaint of at least 50% for improved QoL.    Time 6    Period Weeks    Status On-going                 Plan - 02/15/20 1301    Clinical Impression Statement Began with reviewing HEP and goals. Difficulty performing neck retraction in sitting so returned to supine for reduced difficulty. Pt tolerates seated cervical ROM in all direction, most limited in lateral sidebending requiring significantly decreased ROM to perform. Added posture strengthening exercises this date with scap squeezes and good performance. Ended  with RUE PROM within pain-free range and manual STM to bil mid trap and upper traps with most pain/tenderness on R side through middle trap. Pt with significant muscle tightness noted with manual STM. Pt RUE PROM abduction ~100 deg before backing off due to facial wincing noted. Pt denies pain at EOS and verbalizes understanding with exercises at home. Continue to progress as able.    Personal Factors and Comorbidities Age;Time since onset of injury/illness/exacerbation;Comorbidity 3+    Comorbidities HTN, Fibromyalgia, Hx of RT shoulder RTC repair June 2020    Examination-Activity Limitations Lift;Reach Overhead;Carry;Dressing;Hygiene/Grooming    Examination-Participation Restrictions Cleaning;Meal Prep;Community Activity;Shop    Stability/Clinical Decision Making Evolving/Moderate complexity    Rehab Potential Fair    PT Frequency 2x / week    PT Duration 6 weeks    PT Treatment/Interventions ADLs/Self Care Home Management;Aquatic Therapy;Cryotherapy;Electrical Stimulation;Moist Heat;Traction;DME Instruction;Gait training;Stair training;Functional mobility training;Therapeutic activities;Therapeutic exercise;Balance training;Neuromuscular re-education;Patient/family education;Orthotic Fit/Training;Manual techniques;Passive range of motion;Dry needling;Energy conservation;Taping    PT Next Visit Plan PROM to RT shoulder and cervical spine for improve mobility. Focus on improving RT shoulder mobility, STM to traps & levator. Continue to monitor reports of tingling/numbness in hands and feet as needed.    PT Home Exercise Plan 02/12/20: Cervical retraction 10x; 7/16: chin tucks in supine, seated scap retraction    Consulted and Agree with Plan of Care Patient           Patient will benefit from skilled therapeutic intervention in order to improve the following deficits and impairments:  Increased fascial restricitons, Pain, Decreased mobility, Decreased activity tolerance, Decreased endurance,  Decreased range of motion, Decreased strength, Hypomobility, Impaired UE functional use  Visit Diagnosis: Cervicalgia  Stiffness of right shoulder, not elsewhere classified  Other symptoms and signs involving the musculoskeletal system     Problem List Patient Active Problem List   Diagnosis Date Noted  .  Acute pancreatitis 10/17/2019  . ETOH abuse 10/17/2019  . S/P right rotator cuff repair 01/30/19 02/06/2019  . Nontraumatic complete tear of right rotator cuff   . Arthritis of right acromioclavicular joint   . S/P total knee replacement, 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/24/2018  . Shoulder impingement, right 05/24/2018  . Dehydration   . Intractable cyclical vomiting 32/67/1245  . Pyloric stenosis in adult   . Thrombocytopenia ([place]) 12/20/2017  . Leukocytosis   . Malnutrition of moderate degree 12/17/2017  . Acute renal failure (ARF) ([place]) 12/15/2017  . Chronic abdominal pain 12/15/2017  . Gastroenteritis 06/14/2016  . Nausea with vomiting 06/10/2016  . Uterine enlargement 06/09/2016  . Intractable nausea and vomiting 06/08/2016  . Acute infective gastroenteritis 06/08/2016  . Diarrhea 06/08/2016  . Essential hypertension 06/08/2016  . GERD (gastroesophageal reflux disease) 06/08/2016  . Abdominal pain   . Endometrial polyp 04/15/2016  . PMB (postmenopausal bleeding) 02/27/2016  . Alcoholic cirrhosis of liver without ascites ([place])   . Asthma 01/21/2016  . Hepatic cirrhosis ([place]) 09/22/2015  . Arthritis of knee, degenerative 02/05/2015  . History of Helicobacter pylori infection 10/30/2014  . Chronic [name] with cirrhosis ([place]) 04/11/2014  . De Quervain's disease (radial styloid tenosynovitis) 12/25/2013  . Anorexia 11/21/2012  . FH: colon cancer 11/21/2012  . Early satiety 10/25/2012  . Bowel habit changes 10/25/2012  . Abdominal pain, epigastric 10/25/2012  . Abdominal bloating 10/25/2012  . Constipation 10/25/2012  . Abnormal weight loss 10/25/2012  . Chronic viral  [name] ([place]) 10/25/2012  . Radicular pain of left lower extremity 09/28/2012  . Back pain 09/28/2012  . Sciatica 08/10/2011  . S/P arthroscopy of left knee 08/10/2011  . Tibial plateau fracture 08/10/2011  . Pain in joint, lower leg 02/12/2011  . Stiffness of joint, not elsewhere classified, lower leg 02/12/2011  . 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 Meniscus, medial, derangement 12/29/2010  . CLOSED FRACTURE OF UPPER END OF TIBIA 08/12/2010      Talbot Grumbling PT, DPT 02/15/20, 1:58 PM [facility] [address] [place], [place], [address] Phone: [phone]   Fax:  [phone]  Name: Colleen Lee MRN: [id number] Date of Birth: Dec 31, 1955

## 2020-02-18 ENCOUNTER — Other Ambulatory Visit: Payer: Self-pay | Admitting: Orthopedic Surgery

## 2020-02-18 MED ORDER — HYDROCODONE-ACETAMINOPHEN 5-325 MG PO TABS: 1.0000 | ORAL_TABLET | Freq: Four times a day (QID) | ORAL | 0 refills | Status: DC | PRN

## 2020-02-18 NOTE — Telephone Encounter (Signed)
Patient requests refill on Hydrocodone/Acetaminophen 5-325 mgs.  Qty 21  Sig: Take 1 tablet by mouth every 6 (six) hours as needed for moderate pain.  Patient states she uses [facility] 

## 2020-02-21 ENCOUNTER — Ambulatory Visit (HOSPITAL_COMMUNITY): Payer: Medicare Other

## 2020-02-21 ENCOUNTER — Encounter (HOSPITAL_COMMUNITY): Payer: Self-pay

## 2020-02-21 ENCOUNTER — Other Ambulatory Visit: Payer: Self-pay

## 2020-02-21 DIAGNOSIS — R29898 Other symptoms and signs involving the musculoskeletal system: Secondary | ICD-10-CM

## 2020-02-21 DIAGNOSIS — M542 Cervicalgia: Secondary | ICD-10-CM

## 2020-02-21 DIAGNOSIS — M25611 Stiffness of right shoulder, not elsewhere classified: Secondary | ICD-10-CM

## 2020-02-21 DIAGNOSIS — M25511 Pain in right shoulder: Secondary | ICD-10-CM | POA: Diagnosis not present

## 2020-02-21 NOTE — Therapy (Signed)
[name], [place], [address] Phone: [phone]   Fax:  [phone]  Physical Therapy Treatment  Patient Details  Name: Colleen Lee MRN: [id number] Date of Birth: 1956-02-02 Referring Provider (PT): Ashok Pall, MD   Encounter Date: 02/21/2020   PT End of Session - 02/21/20 1309    Visit Number 3    Number of Visits 12    Date for PT Re-Evaluation 03/25/20    Authorization Type Primary: [employer] Medicare; Secondary: Medicaid    Progress Note Due on Visit 10    PT Start Time 1401    PT Stop Time 1443    PT Time Calculation (min) 42 min    Activity Tolerance Patient tolerated treatment well;No increased pain    Behavior During Therapy WFL for tasks assessed/performed           Past Medical History:  Diagnosis Date  . Asthma   . Chronic abdominal pain   . Chronic back pain   . Chronic constipation   . Cirrhosis ([place])    Metavir score F4 on elastography 2015  . Cyclical vomiting   . Fibroids   . Fibromyalgia   . GERD (gastroesophageal reflux disease)   . H. pylori infection 2014   treated with pylera, had to be treated again as it was not eradicated. Urea breath test then negative after subsequent treatment.   . [name]    HCV RNA positive 09/2012  . Hypertension   . Marijuana use   . Nausea and vomiting    chronic, recurrent  . PONV (postoperative nausea and vomiting)    pt thinks maybe once she had N&V  . Sciatica of left side     Past Surgical History:  Procedure Laterality Date  . BALLOON DILATION  12/20/2017   Procedure: BALLOON DILATION;  Surgeon: Danie Binder, MD;  [facility];  Service: Endoscopy;;  pyloric dilation  . BIOPSY  12/20/2017   Procedure: BIOPSY;  Surgeon: Danie Binder, MD;  [facility];  Service: Endoscopy;;  duodenal and gastric biopsy  . COLONOSCOPY  01/18/2008   LKG:MWNUUV rectum.  Long redundant colon, a diminutive sigmoid polyp status  post cold biopsy removed. Hyperplastic polyp. Repeat colonoscopy June 2014 due to family history of colon cancer  . COLONOSCOPY WITH ESOPHAGOGASTRODUODENOSCOPY (EGD) N/A 11/02/2012   OZD:GUYQIHKV gastric mucosa of doubtful, +H.pylori. Incomplete colonoscopy due to patient unable to tolerate exam, proximal colon seen. Patient refused ACBE.  Marland Kitchen COLONOSCOPY WITH PROPOFOL N/A 03/08/2013   Dr. Gala Romney: colonic polyp-removed as scribed above. Internal Hemorrhoids. Pathology did not reveal any colonic tissue, only mucus. SURVEILLANCE DUE Aug 2019  . COLONOSCOPY WITH PROPOFOL N/A 01/19/2018   Dr. Gala Romney: 6 mm cecal inflammatoy polyp, otherwise normal., Surveilance in 5 year s  . ESOPHAGEAL DILATION  12/20/2017   Procedure: ESOPHAGEAL DILATION;  Surgeon: Danie Binder, MD;  [facility];  Service: Endoscopy;;  . ESOPHAGOGASTRODUODENOSCOPY  01/18/2008   RMR: Normal esophagus, normal  stomach  . ESOPHAGOGASTRODUODENOSCOPY (EGD) WITH PROPOFOL N/A 02/09/2016   Normal esophagus, small hiatal hernia, portal hypertensive gastropathy, normal second portion of duodenum. Due in July 2019  . ESOPHAGOGASTRODUODENOSCOPY (EGD) WITH PROPOFOL N/A 12/20/2017   Dr. Oneida Alar: benign appearing esophageal stenosis s/p dilation, mild pyloric stenosis s/p biopsy and dilation, mild duodenitis.   Marland Kitchen HYSTEROSCOPY  05/11/2016   Procedure: HYSTEROSCOPY;  Surgeon: Jonnie Kind, MD;  Location: [facility];  Service: Gynecology;;  .  KNEE SURGERY     left knee  . MULTIPLE EXTRACTIONS WITH ALVEOLOPLASTY N/A 10/15/2013   Procedure: MULTIPLE EXTRACTION WITH ALVEOLOPLASTY;  Surgeon: Gae Bon, DDS;  Location: [facility];  Service: Oral Surgery;  Laterality: N/A;  . POLYPECTOMY N/A 03/08/2013   Procedure: POLYPECTOMY;  Surgeon: Daneil Dolin, MD;  Location: [facility];  Service: Endoscopy;  Laterality: N/A;  . POLYPECTOMY N/A 05/11/2016   Procedure: REMOVAL OF ENDOMETRIAL POLYP;  Surgeon: Jonnie Kind, MD;  Location: [facility];  Service:  Gynecology;  Laterality: N/A;  . RESECTION DISTAL CLAVICAL Right 01/30/2019   Procedure: RESECTION DISTAL CLAVICAL;  Surgeon: Carole Civil, MD;  Location: [facility];  Service: Orthopedics;  Laterality: Right;  . SHOULDER OPEN ROTATOR CUFF REPAIR Right 01/30/2019   Procedure: ROTATOR CUFF REPAIR SHOULDER OPEN;  Surgeon: Carole Civil, MD;  Location: [facility];  Service: Orthopedics;  Laterality: Right;  pt to arrive at 7:30 for PICC at 8:00  . TOTAL KNEE ARTHROPLASTY Left 02/05/2015   Procedure: LEFT TOTAL KNEE ARTHROPLASTY;  Surgeon: Carole Civil, MD;  Location: [facility];  Service: Orthopedics;  Laterality: Left;    There were no vitals filed for this visit.   Subjective Assessment - 02/21/20 1309    Subjective Patient reports she is hurting and fatigued all over today; not sure why.    Pertinent History History of Right RTC repair 01/2019.    Limitations House hold activities;Lifting    Diagnostic tests MRI of cervical spine    Patient Stated Goals To have less neck pain    Currently in Pain? Yes    Pain Score 7     Pain Location Shoulder    Pain Orientation Right    Pain Descriptors / Indicators Aching;Dull    Pain Radiating Towards denies             [facility] Adult PT Treatment/Exercise - 02/21/20 0001      Posture/Postural Control   Posture/Postural Control Postural limitations    Postural Limitations Forward head;Rounded Shoulders;Anterior pelvic tilt;Flexed trunk    Posture Comments abdomen protruding forward significantly      Neck Exercises: Seated   Neck Retraction 10 reps    Neck Retraction Limitations 3'' holds. Cues for form.     Other Seated Exercise --    Other Seated Exercise scapular retraction, x15 reps      Neck Exercises: Supine   Cervical Isometrics Extension;5 reps;5 secs;Right lateral flexion;Left lateral flexion    Neck Retraction 3 secs;5 reps    Capital Flexion 5 reps;3 secs    Cervical Rotation Both;10 reps    Cervical Rotation Limitations 5  sec hold      Manual Therapy   Manual Therapy Joint mobilization;Soft tissue mobilization    Manual therapy comments completed seperately from rest of treatment interventions    Joint Mobilization AP, Inf/Sup to middle clavicle    Soft tissue mobilization supine - suboccipital release, B UT, R parascap mm    Passive ROM R shldr flex, abd, ER - all painful at end range             PT Education - 02/21/20 1552    Education Details Discussed purpose and technique throughout session.    Person(s) Educated Patient    Methods Explanation;Demonstration    Comprehension Verbalized understanding;Need further instruction            PT Short Term Goals - 02/21/20 1310      PT SHORT TERM GOAL #  1   Title Patient will report understanding and regular compliance with HEP to improve mobility, improve stability, and decrease pain.    Time 3    Period Weeks    Status On-going    Target Date 03/04/20      PT SHORT TERM GOAL #2   Title Patient will report improvement in overall subjective complaint of at least 25% for improved QoL.    Time 3    Period Weeks    Status On-going    Target Date 03/04/20             PT Long Term Goals - 02/21/20 1310      PT LONG TERM GOAL #1   Title Patient will demonstrate WFL AROM of RT shoulder to improve functional mobility and decrease comensatory movements affecting the cervical spine.    Time 6    Period Weeks    Status On-going      PT LONG TERM GOAL #2   Title Patient will demonstrate cervical AROM WFL in order to observe the environment with improved ease and decreased pain.    Time 6    Period Weeks    Status On-going      PT LONG TERM GOAL #3   Title Patient will demonstrate improvement on FOTO of at least 10% indicating improved overall functional mobility.    Time 6    Period Weeks    Status On-going      PT LONG TERM GOAL #4   Title Patient will report improvement in overall subjective complaint of at least 50% for improved  QoL.    Time 6    Period Weeks    Status On-going            Plan - 02/21/20 1309    Clinical Impression Statement Patient tolerated session fairly well today. Significant soft tissue adhesions noted on bilateral upper trapezius and parascapular muscles, however, addressing these areas did not recreate her pain. Patient had a difficult time relaxing for effective suboccipital release. Painful right lateral neck with left and right rotation, as well as, left and right lateral flexion. Painful A/P and I/S joint glides to middle clavicle. Joint mobilizations to the SCJ and ACJ were not painful. Patient exhibited a difficult time coordinating chin tuck in isolation and with head lift and neck extension. Cervical spine AAROM rotation left and right supine on ball. Post treatment, cervical spine left rotation to approx. 70 degrees and non-painful; right approx. 70, painful at end range. Overall, patient reported 5/10 pain in right side of neck post session. Patient has another appointment tomorrow. Continue with current plan. Progress ROM, strength and HEP as able.    Personal Factors and Comorbidities Age;Time since onset of injury/illness/exacerbation;Comorbidity 3+    Comorbidities HTN, Fibromyalgia, Hx of RT shoulder RTC repair June 2020    Examination-Activity Limitations Lift;Reach Overhead;Carry;Dressing;Hygiene/Grooming    Examination-Participation Restrictions Cleaning;Meal Prep;Community Activity;Shop    Stability/Clinical Decision Making Evolving/Moderate complexity    Rehab Potential Fair    PT Frequency 2x / week    PT Duration 6 weeks    PT Treatment/Interventions ADLs/Self Care Home Management;Aquatic Therapy;Cryotherapy;Electrical Stimulation;Moist Heat;Traction;DME Instruction;Gait training;Stair training;Functional mobility training;Therapeutic activities;Therapeutic exercise;Balance training;Neuromuscular re-education;Patient/family education;Orthotic Fit/Training;Manual  techniques;Passive range of motion;Dry needling;Energy conservation;Taping    PT Next Visit Plan PROM to RT shoulder and cervical spine for improve mobility. Focus on improving RT shoulder mobility, STM to traps & levator. Continue to monitor reports of tingling/numbness in hands and feet as needed.  PT Home Exercise Plan 02/12/20: Cervical retraction 10x; 7/16: chin tucks in supine, seated scap retraction    Consulted and Agree with Plan of Care Patient           Patient will benefit from skilled therapeutic intervention in order to improve the following deficits and impairments:  Increased fascial restricitons, Pain, Decreased mobility, Decreased activity tolerance, Decreased endurance, Decreased range of motion, Decreased strength, Hypomobility, Impaired UE functional use  Visit Diagnosis: Cervicalgia  Stiffness of right shoulder, not elsewhere classified  Other symptoms and signs involving the musculoskeletal system     Problem List Patient Active Problem List   Diagnosis Date Noted  . Acute pancreatitis 10/17/2019  . ETOH abuse 10/17/2019  . S/P right rotator cuff repair 01/30/19 02/06/2019  . Nontraumatic complete tear of right rotator cuff   . Arthritis of right acromioclavicular joint   . S/P total knee replacement, 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/24/2018  . Shoulder impingement, right 05/24/2018  . Dehydration   . Intractable cyclical vomiting 44/96/7591  . Pyloric stenosis in adult   . Thrombocytopenia ([place]) 12/20/2017  . Leukocytosis   . Malnutrition of moderate degree 12/17/2017  . Acute renal failure (ARF) ([place]) 12/15/2017  . Chronic abdominal pain 12/15/2017  . Gastroenteritis 06/14/2016  . Nausea with vomiting 06/10/2016  . Uterine enlargement 06/09/2016  . Intractable nausea and vomiting 06/08/2016  . Acute infective gastroenteritis 06/08/2016  . Diarrhea 06/08/2016  . Essential hypertension 06/08/2016  . GERD (gastroesophageal reflux disease) 06/08/2016  . Abdominal  pain   . Endometrial polyp 04/15/2016  . PMB (postmenopausal bleeding) 02/27/2016  . Alcoholic cirrhosis of liver without ascites ([place])   . Asthma 01/21/2016  . Hepatic cirrhosis ([place]) 09/22/2015  . Arthritis of knee, degenerative 02/05/2015  . History of Helicobacter pylori infection 10/30/2014  . Chronic [name] with cirrhosis ([place]) 04/11/2014  . De Quervain's disease (radial styloid tenosynovitis) 12/25/2013  . Anorexia 11/21/2012  . FH: colon cancer 11/21/2012  . Early satiety 10/25/2012  . Bowel habit changes 10/25/2012  . Abdominal pain, epigastric 10/25/2012  . Abdominal bloating 10/25/2012  . Constipation 10/25/2012  . Abnormal weight loss 10/25/2012  . Chronic viral [name] ([place]) 10/25/2012  . Radicular pain of left lower extremity 09/28/2012  . Back pain 09/28/2012  . Sciatica 08/10/2011  . S/P arthroscopy of left knee 08/10/2011  . Tibial plateau fracture 08/10/2011  . Pain in joint, lower leg 02/12/2011  . Stiffness of joint, not elsewhere classified, lower leg 02/12/2011  . 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 Meniscus, medial, derangement 12/29/2010  . CLOSED FRACTURE OF UPPER END OF TIBIA 08/12/2010    Floria Raveling. Hartnett-Rands, MS, PT Per [place] [phone] 02/21/2020, 4:03 PM  [facility] [address] [place], [place], [address] Phone: [phone]   Fax:  [phone]  Name: KASEN SAKO MRN: [id number] Date of Birth: 07-Mar-1956

## 2020-02-22 ENCOUNTER — Ambulatory Visit (HOSPITAL_COMMUNITY): Payer: Medicare Other

## 2020-02-22 DIAGNOSIS — R29898 Other symptoms and signs involving the musculoskeletal system: Secondary | ICD-10-CM

## 2020-02-22 DIAGNOSIS — M25611 Stiffness of right shoulder, not elsewhere classified: Secondary | ICD-10-CM

## 2020-02-22 DIAGNOSIS — M542 Cervicalgia: Secondary | ICD-10-CM

## 2020-02-22 DIAGNOSIS — M25511 Pain in right shoulder: Secondary | ICD-10-CM | POA: Diagnosis not present

## 2020-02-22 NOTE — Patient Instructions (Signed)
Extension: Isometric (Supine / Sitting)    -Keep chin tipped down. -CAUTION: Use gentle pressure to avoid neck strain. Hold _3-5__ seconds. Relax. Repeat 5-10___ times. Do 1___ sessions per day. Variation: Lying: Perform with head lifted _1-2__ inches off the bed. Sitting: Perform with head tipped forward.   Copyright  VHI. All rights reserved.   Flexion: Isometric (Supine / Sitting)    Position Helper: Place hand on forehead. Stabilize with other hand. Motion -Cue patient to exhale while pushing head into helper's hand. -Keep chin tipped down. -Helper blocks movement. CAUTION: Use gentle pressure to avoid neck strain. Hold 3-5___ seconds. Relax. Repeat _5-10__ times. Do ___ sessions per day. Variation: Lying: Perform with head lifted ___ inches off the bed. Sitting: Perform with head tipped forward.   Copyright  VHI. All rights reserved.   Lateral Flexion: Isometric (Supine / Sitting)    Position Helper: Place hand at left side of head. Stabilize with other hand. Motion - Cue patient to exhale while tilting head to the side into helper's hand. -Helper blocks movement. CAUTION: Use gentle pressure to avoid neck strain. Hold 3-5___ seconds. Relax. Repeat 5-10___ times. Repeat to other side. Do ___ sessions per day. Variation: Perform with head tipped toward: left right   Copyright  VHI. All rights reserved.

## 2020-02-22 NOTE — Therapy (Signed)
[facility], [place], [address] Phone: [phone]   Fax:  [phone]  Physical Therapy Treatment  Patient Details  Name: Colleen Lee MRN: [id number] Date of Birth: 04-10-1956 Referring Provider (PT): Ashok Pall, MD   Encounter Date: 02/22/2020   PT End of Session - 02/22/20 1540    Visit Number 4    Number of Visits 12    Date for PT Re-Evaluation 03/25/20    Authorization Type Primary: [employer] Medicare; Secondary: Medicaid    Progress Note Due on Visit 10    PT Start Time 1448    PT Stop Time 1534    PT Time Calculation (min) 46 min    Activity Tolerance Patient tolerated treatment well;No increased pain    Behavior During Therapy WFL for tasks assessed/performed           Past Medical History:  Diagnosis Date  . Asthma   . Chronic abdominal pain   . Chronic back pain   . Chronic constipation   . Cirrhosis ([place])    Metavir score F4 on elastography 2015  . Cyclical vomiting   . Fibroids   . Fibromyalgia   . GERD (gastroesophageal reflux disease)   . H. pylori infection 2014   treated with pylera, had to be treated again as it was not eradicated. Urea breath test then negative after subsequent treatment.   . [name]    HCV RNA positive 09/2012  . Hypertension   . Marijuana use   . Nausea and vomiting    chronic, recurrent  . PONV (postoperative nausea and vomiting)    pt thinks maybe once she had N&V  . Sciatica of left side     Past Surgical History:  Procedure Laterality Date  . BALLOON DILATION  12/20/2017   Procedure: BALLOON DILATION;  Surgeon: Danie Binder, MD;  [facility];  Service: Endoscopy;;  pyloric dilation  . BIOPSY  12/20/2017   Procedure: BIOPSY;  Surgeon: Danie Binder, MD;  [facility];  Service: Endoscopy;;  duodenal and gastric biopsy  . COLONOSCOPY  01/18/2008   KVQ:QVZDGL rectum.  Long redundant colon, a diminutive sigmoid polyp status  post cold biopsy removed. Hyperplastic polyp. Repeat colonoscopy June 2014 due to family history of colon cancer  . COLONOSCOPY WITH ESOPHAGOGASTRODUODENOSCOPY (EGD) N/A 11/02/2012   OVF:IEPPIRJJ gastric mucosa of doubtful, +H.pylori. Incomplete colonoscopy due to patient unable to tolerate exam, proximal colon seen. Patient refused ACBE.  Marland Kitchen COLONOSCOPY WITH PROPOFOL N/A 03/08/2013   Dr. Gala Romney: colonic polyp-removed as scribed above. Internal Hemorrhoids. Pathology did not reveal any colonic tissue, only mucus. SURVEILLANCE DUE Aug 2019  . COLONOSCOPY WITH PROPOFOL N/A 01/19/2018   Dr. Gala Romney: 6 mm cecal inflammatoy polyp, otherwise normal., Surveilance in 5 year s  . ESOPHAGEAL DILATION  12/20/2017   Procedure: ESOPHAGEAL DILATION;  Surgeon: Danie Binder, MD;  [facility];  Service: Endoscopy;;  . ESOPHAGOGASTRODUODENOSCOPY  01/18/2008   RMR: Normal esophagus, normal  stomach  . ESOPHAGOGASTRODUODENOSCOPY (EGD) WITH PROPOFOL N/A 02/09/2016   Normal esophagus, small hiatal hernia, portal hypertensive gastropathy, normal second portion of duodenum. Due in July 2019  . ESOPHAGOGASTRODUODENOSCOPY (EGD) WITH PROPOFOL N/A 12/20/2017   Dr. Oneida Alar: benign appearing esophageal stenosis s/p dilation, mild pyloric stenosis s/p biopsy and dilation, mild duodenitis.   Marland Kitchen HYSTEROSCOPY  05/11/2016   Procedure: HYSTEROSCOPY;  Surgeon: Jonnie Kind, MD;  Location: [facility];  Service: Gynecology;;  .  KNEE SURGERY     left knee  . MULTIPLE EXTRACTIONS WITH ALVEOLOPLASTY N/A 10/15/2013   Procedure: MULTIPLE EXTRACTION WITH ALVEOLOPLASTY;  Surgeon: Gae Bon, DDS;  Location: [name];  Service: Oral Surgery;  Laterality: N/A;  . POLYPECTOMY N/A 03/08/2013   Procedure: POLYPECTOMY;  Surgeon: Daneil Dolin, MD;  Location[facility];  Service: Endoscopy;  Laterality: N/A;  . POLYPECTOMY N/A 05/11/2016   Procedure: REMOVAL OF ENDOMETRIAL POLYP;  Surgeon: Jonnie Kind, MD;  Location: [facility];  Service:  Gynecology;  Laterality: N/A;  . RESECTION DISTAL CLAVICAL Right 01/30/2019   Procedure: RESECTION DISTAL CLAVICAL;  Surgeon: Carole Civil, MD;  Location: [facility];  Service: Orthopedics;  Laterality: Right;  . SHOULDER OPEN ROTATOR CUFF REPAIR Right 01/30/2019   Procedure: ROTATOR CUFF REPAIR SHOULDER OPEN;  Surgeon: Carole Civil, MD;  Location: [facility];  Service: Orthopedics;  Laterality: Right;  pt to arrive at 7:30 for PICC at 8:00  . TOTAL KNEE ARTHROPLASTY Left 02/05/2015   Procedure: LEFT TOTAL KNEE ARTHROPLASTY;  Surgeon: Carole Civil, MD;  Location: [facility];  Service: Orthopedics;  Laterality: Left;    There were no vitals filed for this visit.   Subjective Assessment - 02/22/20 1538    Subjective Patient states pain has been a bit better but did take a pain pill before PT today. Has been trying to keep her chin tucked and shoulders back more. Did mop this morning but it took her more time than usual, no increase in pain after.    Pertinent History History of Right RTC repair 01/2019.    Limitations House hold activities;Lifting    Diagnostic tests MRI of cervical spine    Patient Stated Goals To have less neck pain    Currently in Pain? Yes    Pain Score 5     Pain Location Shoulder    Pain Orientation Right            [facility] Adult PT Treatment/Exercise - 02/22/20 0001      Posture/Postural Control   Posture/Postural Control Postural limitations    Postural Limitations Forward head;Rounded Shoulders;Anterior pelvic tilt;Flexed trunk    Posture Comments abdomen protruding forward significantly      Neck Exercises: Seated   Neck Retraction 10 reps    Neck Retraction Limitations 3'' holds. Cues for form.     Shoulder Rolls Backwards;10 reps    Other Seated Exercise scapular retraction, x15 reps      Neck Exercises: Supine   Cervical Isometrics Extension;5 reps;5 secs;Right lateral flexion;Left lateral flexion    Neck Retraction 5 reps;5 secs    Capital  Flexion 5 reps;3 secs    Cervical Rotation Both;10 reps    Cervical Rotation Limitations 5 sec hold; on pink ball      Manual Therapy   Manual Therapy Soft tissue mobilization;Muscle Energy Technique    Manual therapy comments completed seperately from rest of treatment interventions    Soft tissue mobilization Cspine paraspinals in rotation, R upper trap, manual stretch B UT, levator and parapsinals    Muscle Energy Technique mid range rotation contract/relax x3 B             PT Education - 02/22/20 1540    Education Details Discussed purpose and technique throughout session. Advance HEP.    Methods Explanation;Demonstration;Handout    Comprehension Verbalized understanding;Need further instruction            PT Short Term Goals - 02/21/20 1310  PT SHORT TERM GOAL #1   Title Patient will report understanding and regular compliance with HEP to improve mobility, improve stability, and decrease pain.    Time 3    Period Weeks    Status On-going    Target Date 03/04/20      PT SHORT TERM GOAL #2   Title Patient will report improvement in overall subjective complaint of at least 25% for improved QoL.    Time 3    Period Weeks    Status On-going    Target Date 03/04/20             PT Long Term Goals - 02/21/20 1310      PT LONG TERM GOAL #1   Title Patient will demonstrate WFL AROM of RT shoulder to improve functional mobility and decrease comensatory movements affecting the cervical spine.    Time 6    Period Weeks    Status On-going      PT LONG TERM GOAL #2   Title Patient will demonstrate cervical AROM WFL in order to observe the environment with improved ease and decreased pain.    Time 6    Period Weeks    Status On-going      PT LONG TERM GOAL #3   Title Patient will demonstrate improvement on FOTO of at least 10% indicating improved overall functional mobility.    Time 6    Period Weeks    Status On-going      PT LONG TERM GOAL #4   Title  Patient will report improvement in overall subjective complaint of at least 50% for improved QoL.    Time 6    Period Weeks    Status On-going            Plan - 02/22/20 1540    Clinical Impression Statement Patient tolerated session fairly well today. Responding well to muscle energy techniques and soft tissue mobilizations to neck and PROM to right shoulder. Right shoulder PROM increased today with flexion and abduction reaching above shoulder level in supine. Scaption to 120 degrees before pain complaints. Continued with cervical stabilization exercises and added these to her HEP. Continued with current plan. Progress as able. Consider adding upper trap, levator and cervical paraspinal stretches to HEP next session.    Personal Factors and Comorbidities Age;Time since onset of injury/illness/exacerbation;Comorbidity 3+    Comorbidities HTN, Fibromyalgia, Hx of RT shoulder RTC repair June 2020    Examination-Activity Limitations Lift;Reach Overhead;Carry;Dressing;Hygiene/Grooming    Examination-Participation Restrictions Cleaning;Meal Prep;Community Activity;Shop    Stability/Clinical Decision Making Evolving/Moderate complexity    Rehab Potential Fair    PT Frequency 2x / week    PT Duration 6 weeks    PT Treatment/Interventions ADLs/Self Care Home Management;Aquatic Therapy;Cryotherapy;Electrical Stimulation;Moist Heat;Traction;DME Instruction;Gait training;Stair training;Functional mobility training;Therapeutic activities;Therapeutic exercise;Balance training;Neuromuscular re-education;Patient/family education;Orthotic Fit/Training;Manual techniques;Passive range of motion;Dry needling;Energy conservation;Taping    PT Next Visit Plan PROM to RT shoulder and cervical spine for improve mobility. Focus on improving RT shoulder mobility, STM to traps & levator. Continue to monitor reports of tingling/numbness in hands and feet as needed. Consider upper trap, levator and paraspinal stretches to  HEP next session.    PT Home Exercise Plan 02/12/20: Cervical retraction 10x; 7/16: chin tucks in supine, seated scap retraction; 02/22/20 - neck retraction, C-spine isometrics - extension, lateral flexion, rotation, chin tuck/head lift    Consulted and Agree with Plan of Care Patient           Patient will  benefit from skilled therapeutic intervention in order to improve the following deficits and impairments:  Increased fascial restricitons, Pain, Decreased mobility, Decreased activity tolerance, Decreased endurance, Decreased range of motion, Decreased strength, Hypomobility, Impaired UE functional use  Visit Diagnosis: Cervicalgia  Stiffness of right shoulder, not elsewhere classified  Other symptoms and signs involving the musculoskeletal system     Problem List Patient Active Problem List   Diagnosis Date Noted  . Acute pancreatitis 10/17/2019  . ETOH abuse 10/17/2019  . S/P right rotator cuff repair 01/30/19 02/06/2019  . Nontraumatic complete tear of right rotator cuff   . Arthritis of right acromioclavicular joint   . S/P total knee replacement, 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/24/2018  . Shoulder impingement, right 05/24/2018  . Dehydration   . Intractable cyclical vomiting 27/51/7001  . Pyloric stenosis in adult   . Thrombocytopenia ([place]) 12/20/2017  . Leukocytosis   . Malnutrition of moderate degree 12/17/2017  . Acute renal failure (ARF) ([place]) 12/15/2017  . Chronic abdominal pain 12/15/2017  . Gastroenteritis 06/14/2016  . Nausea with vomiting 06/10/2016  . Uterine enlargement 06/09/2016  . Intractable nausea and vomiting 06/08/2016  . Acute infective gastroenteritis 06/08/2016  . Diarrhea 06/08/2016  . Essential hypertension 06/08/2016  . GERD (gastroesophageal reflux disease) 06/08/2016  . Abdominal pain   . Endometrial polyp 04/15/2016  . PMB (postmenopausal bleeding) 02/27/2016  . Alcoholic cirrhosis of liver without ascites ([place])   . Asthma 01/21/2016  . Hepatic  cirrhosis ([place]) 09/22/2015  . Arthritis of knee, degenerative 02/05/2015  . History of Helicobacter pylori infection 10/30/2014  . Chronic [name] with cirrhosis ([place]) 04/11/2014  . De Quervain's disease (radial styloid tenosynovitis) 12/25/2013  . Anorexia 11/21/2012  . FH: colon cancer 11/21/2012  . Early satiety 10/25/2012  . Bowel habit changes 10/25/2012  . Abdominal pain, epigastric 10/25/2012  . Abdominal bloating 10/25/2012  . Constipation 10/25/2012  . Abnormal weight loss 10/25/2012  . Chronic viral [name] ([place]) 10/25/2012  . Radicular pain of left lower extremity 09/28/2012  . Back pain 09/28/2012  . Sciatica 08/10/2011  . S/P arthroscopy of left knee 08/10/2011  . Tibial plateau fracture 08/10/2011  . Pain in joint, lower leg 02/12/2011  . Stiffness of joint, not elsewhere classified, lower leg 02/12/2011  . 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 Meniscus, medial, derangement 12/29/2010  . CLOSED FRACTURE OF UPPER END OF TIBIA 08/12/2010    Floria Raveling. Hartnett-Rands, MS, PT [facility] #[id number] 02/22/2020, 3:47 PM  [facility] [address] [place], [place], [address] Phone: [phone]   Fax:  [phone]  Name: TIKITA MABEE MRN: [id number] Date of Birth: 1955-09-10

## 2020-02-25 ENCOUNTER — Other Ambulatory Visit: Payer: Self-pay | Admitting: Orthopedic Surgery

## 2020-02-25 MED ORDER — HYDROCODONE-ACETAMINOPHEN 5-325 MG PO TABS: 1.0000 | ORAL_TABLET | Freq: Four times a day (QID) | ORAL | 0 refills | Status: DC | PRN

## 2020-02-25 NOTE — Telephone Encounter (Signed)
Patient called for refill:  HYDROcodone-acetaminophen (NORCO/VICODIN) 5-325 MG tablet 21 tablet  -[facility]   -patient states still going through physical therapy; no follow up scheduled at this time

## 2020-02-27 ENCOUNTER — Other Ambulatory Visit (HOSPITAL_COMMUNITY): Payer: Self-pay | Admitting: Internal Medicine

## 2020-02-27 DIAGNOSIS — Z0000 Encounter for general adult medical examination without abnormal findings: Secondary | ICD-10-CM | POA: Diagnosis not present

## 2020-02-27 DIAGNOSIS — Z1389 Encounter for screening for other disorder: Secondary | ICD-10-CM | POA: Diagnosis not present

## 2020-02-27 DIAGNOSIS — K861 Other chronic pancreatitis: Secondary | ICD-10-CM | POA: Diagnosis not present

## 2020-02-27 DIAGNOSIS — J449 Chronic obstructive pulmonary disease, unspecified: Secondary | ICD-10-CM | POA: Diagnosis not present

## 2020-02-27 DIAGNOSIS — F17210 Nicotine dependence, cigarettes, uncomplicated: Secondary | ICD-10-CM | POA: Diagnosis not present

## 2020-02-27 DIAGNOSIS — Z1231 Encounter for screening mammogram for malignant neoplasm of breast: Secondary | ICD-10-CM

## 2020-02-28 ENCOUNTER — Ambulatory Visit (HOSPITAL_COMMUNITY): Payer: Medicare Other | Admitting: Physical Therapy

## 2020-02-28 ENCOUNTER — Other Ambulatory Visit: Payer: Self-pay

## 2020-02-28 DIAGNOSIS — M542 Cervicalgia: Secondary | ICD-10-CM

## 2020-02-28 DIAGNOSIS — M25511 Pain in right shoulder: Secondary | ICD-10-CM | POA: Diagnosis not present

## 2020-02-28 DIAGNOSIS — M25611 Stiffness of right shoulder, not elsewhere classified: Secondary | ICD-10-CM

## 2020-02-28 DIAGNOSIS — R29898 Other symptoms and signs involving the musculoskeletal system: Secondary | ICD-10-CM | POA: Diagnosis not present

## 2020-02-28 NOTE — Therapy (Signed)
[place], [place], [address] Phone: [phone]   Fax:  [phone]  Physical Therapy Treatment  Patient Details  Name: Colleen Lee MRN: [id number] Date of Birth: [birth year] Referring Provider (PT): Ashok Pall, MD   Encounter Date: 02/28/2020   PT End of Session - 02/28/20 1443    Visit Number 5    Number of Visits 12    Date for PT Re-Evaluation 03/25/20    Authorization Type Primary: UHC Medicare; Secondary: Medicaid    Progress Note Due on Visit 10    PT Start Time 1315    PT Stop Time 1400    PT Time Calculation (min) 45 min    Activity Tolerance Patient tolerated treatment well;No increased pain    Behavior During Therapy WFL for tasks assessed/performed           Past Medical History:  Diagnosis Date  . Asthma   . Chronic abdominal pain   . Chronic back pain   . Chronic constipation   . Cirrhosis ([place])    Metavir score F4 on elastography 2015  . Cyclical vomiting   . Fibroids   . Fibromyalgia   . GERD (gastroesophageal reflux disease)   . H. pylori infection 2014   treated with pylera, had to be treated again as it was not eradicated. Urea breath test then negative after subsequent treatment.   . [name]    HCV RNA positive 09/2012  . Hypertension   . Marijuana use   . Nausea and vomiting    chronic, recurrent  . PONV (postoperative nausea and vomiting)    pt thinks maybe once she had N&V  . Sciatica of left side     Past Surgical History:  Procedure Laterality Date  . BALLOON DILATION  12/20/2017   Procedure: BALLOON DILATION;  Surgeon: Danie Binder, MD;  [facility];  Service: Endoscopy;;  pyloric dilation  . BIOPSY  12/20/2017   Procedure: BIOPSY;  Surgeon: Danie Binder, MD;  [facility];  Service: Endoscopy;;  duodenal and gastric biopsy  . COLONOSCOPY  01/18/2008   AYT:KZSWFU rectum.  Long redundant colon, a diminutive sigmoid polyp status  post cold biopsy removed. Hyperplastic polyp. Repeat colonoscopy June 2014 due to family history of colon cancer  . COLONOSCOPY WITH ESOPHAGOGASTRODUODENOSCOPY (EGD) N/A 11/02/2012   XNA:[id number] gastric mucosa of doubtful, +H.pylori. Incomplete colonoscopy due to patient unable to tolerate exam, proximal colon seen. Patient refused ACBE.  Marland Kitchen COLONOSCOPY WITH PROPOFOL N/A 03/08/2013   Dr. Gala Romney: colonic polyp-removed as scribed above. Internal Hemorrhoids. Pathology did not reveal any colonic tissue, only mucus. SURVEILLANCE DUE Aug 2019  . COLONOSCOPY WITH PROPOFOL N/A 01/19/2018   Dr. Gala Romney: 6 mm cecal inflammatoy polyp, otherwise normal., Surveilance in 5 year s  . ESOPHAGEAL DILATION  12/20/2017   Procedure: ESOPHAGEAL DILATION;  Surgeon: Danie Binder, MD;  [facility];  Service: Endoscopy;;  . ESOPHAGOGASTRODUODENOSCOPY  01/18/2008   RMR: Normal esophagus, normal  stomach  . ESOPHAGOGASTRODUODENOSCOPY (EGD) WITH PROPOFOL N/A 02/09/2016   Normal esophagus, small hiatal hernia, portal hypertensive gastropathy, normal second portion of duodenum. Due in July 2019  . ESOPHAGOGASTRODUODENOSCOPY (EGD) WITH PROPOFOL N/A 12/20/2017   Dr. Oneida Alar: benign appearing esophageal stenosis s/p dilation, mild pyloric stenosis s/p biopsy and dilation, mild duodenitis.   Marland Kitchen HYSTEROSCOPY  05/11/2016   Procedure: HYSTEROSCOPY;  Surgeon: Jonnie Kind, MD;  [facility];  Service: Gynecology;;  .  KNEE SURGERY     left knee  . MULTIPLE EXTRACTIONS WITH ALVEOLOPLASTY N/A 10/15/2013   Procedure: MULTIPLE EXTRACTION WITH ALVEOLOPLASTY;  Surgeon: Gae Bon, DDS;  Location: [facility];  Service: Oral Surgery;  Laterality: N/A;  . POLYPECTOMY N/A 03/08/2013   Procedure: POLYPECTOMY;  Surgeon: Daneil Dolin, MD;  [facility];  Service: Endoscopy;  Laterality: N/A;  . POLYPECTOMY N/A 05/11/2016   Procedure: REMOVAL OF ENDOMETRIAL POLYP;  Surgeon: Jonnie Kind, MD;  [facility];  Service:  Gynecology;  Laterality: N/A;  . RESECTION DISTAL CLAVICAL Right 01/30/2019   Procedure: RESECTION DISTAL CLAVICAL;  Surgeon: Carole Civil, MD;  [facility];  Service: Orthopedics;  Laterality: Right;  . SHOULDER OPEN ROTATOR CUFF REPAIR Right 01/30/2019   Procedure: ROTATOR CUFF REPAIR SHOULDER OPEN;  Surgeon: Carole Civil, MD;  [facility];  Service: Orthopedics;  Laterality: Right;  pt to arrive at 7:30 for PICC at 8:00  . TOTAL KNEE ARTHROPLASTY Left 02/05/2015   Procedure: LEFT TOTAL KNEE ARTHROPLASTY;  Surgeon: Carole Civil, MD;  [facility];  Service: Orthopedics;  Laterality: Left;    There were no vitals filed for this visit.   Subjective Assessment - 02/28/20 1324    Subjective pt states her pain is high at 7/10    Currently in Pain? Yes    Pain Score 7     Pain Location Neck    Pain Orientation Right    Pain Descriptors / Indicators Aching;Radiating    Pain Radiating Towards into Rt shoulder                             [facility] Adult PT Treatment/Exercise - 02/28/20 0001      Neck Exercises: Seated   W Back 10 reps    Other Seated Exercise cervical and thoracic excursions 5X    Other Seated Exercise scapular retraction, x15 reps      Neck Exercises: Supine   Cervical Isometrics Extension;5 secs;Right lateral flexion;Left lateral flexion;10 reps    Neck Retraction 5 reps;5 secs    Neck Retraction Limitations 2 sets; chin tucks, heavy cues    Capital Flexion 5 reps;3 secs    Cervical Rotation Both;10 reps    Cervical Rotation Limitations 5 sec hold; on pink ball      Manual Therapy   Manual Therapy Soft tissue mobilization;Muscle Energy Technique    Manual therapy comments completed seperately from rest of treatment interventions    Soft tissue mobilization seated to bil cervical paraspinals, Rt UT                    PT Short Term Goals - 02/21/20 1310      PT SHORT TERM GOAL #1   Title Patient will report  understanding and regular compliance with HEP to improve mobility, improve stability, and decrease pain.    Time 3    Period Weeks    Status On-going    Target Date 03/04/20      PT SHORT TERM GOAL #2   Title Patient will report improvement in overall subjective complaint of at least 25% for improved QoL.    Time 3    Period Weeks    Status On-going    Target Date 03/04/20             PT Long Term Goals - 02/21/20 1310      PT LONG  TERM GOAL #1   Title Patient will demonstrate WFL AROM of RT shoulder to improve functional mobility and decrease comensatory movements affecting the cervical spine.    Time 6    Period Weeks    Status On-going      PT LONG TERM GOAL #2   Title Patient will demonstrate cervical AROM WFL in order to observe the environment with improved ease and decreased pain.    Time 6    Period Weeks    Status On-going      PT LONG TERM GOAL #3   Title Patient will demonstrate improvement on FOTO of at least 10% indicating improved overall functional mobility.    Time 6    Period Weeks    Status On-going      PT LONG TERM GOAL #4   Title Patient will report improvement in overall subjective complaint of at least 50% for improved QoL.    Time 6    Period Weeks    Status On-going                 Plan - 02/28/20 1443    Clinical Impression Statement Continued with established POC focusing on improving cervical and thoracic mobility and postural strength.   Added thoracic excursions with UE movements without diffiuclty. Completed manual to Rt UT and scap region at EOS in seated with multiple spasms resolved and overall improvement voiced.    Personal Factors and Comorbidities Age;Time since onset of injury/illness/exacerbation;Comorbidity 3+    Comorbidities HTN, Fibromyalgia, Hx of RT shoulder RTC repair June 2020    Examination-Activity Limitations Lift;Reach Overhead;Carry;Dressing;Hygiene/Grooming    Examination-Participation Restrictions  Cleaning;Meal Prep;Community Activity;Shop    Stability/Clinical Decision Making Evolving/Moderate complexity    Rehab Potential Fair    PT Frequency 2x / week    PT Duration 6 weeks    PT Treatment/Interventions ADLs/Self Care Home Management;Aquatic Therapy;Cryotherapy;Electrical Stimulation;Moist Heat;Traction;DME Instruction;Gait training;Stair training;Functional mobility training;Therapeutic activities;Therapeutic exercise;Balance training;Neuromuscular re-education;Patient/family education;Orthotic Fit/Training;Manual techniques;Passive range of motion;Dry needling;Energy conservation;Taping    PT Next Visit Plan PROM to RT shoulder and cervical spine for improve mobility. Focus on improving RT shoulder mobility, STM to traps & levator. Continue to monitor reports of tingling/numbness in hands and feet as needed. Consider upper trap, levator and paraspinal stretches to HEP next session.    PT Home Exercise Plan 02/12/20: Cervical retraction 10x; 7/16: chin tucks in supine, seated scap retraction; 02/22/20 - neck retraction, C-spine isometrics - extension, lateral flexion, rotation, chin tuck/head lift    Consulted and Agree with Plan of Care Patient           Patient will benefit from skilled therapeutic intervention in order to improve the following deficits and impairments:  Increased fascial restricitons, Pain, Decreased mobility, Decreased activity tolerance, Decreased endurance, Decreased range of motion, Decreased strength, Hypomobility, Impaired UE functional use  Visit Diagnosis: No diagnosis found.     Problem List Patient Active Problem List   Diagnosis Date Noted  . Acute pancreatitis 10/17/2019  . ETOH abuse 10/17/2019  . S/P right rotator cuff repair 01/30/19 02/06/2019  . Nontraumatic complete tear of right rotator cuff   . Arthritis of right acromioclavicular joint   . S/P total knee replacement, 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/24/2018  . Shoulder impingement, right 05/24/2018  .  Dehydration   . Intractable cyclical vomiting 39/10/90  . Pyloric stenosis in adult   . Thrombocytopenia ([place]) 12/20/2017  . Leukocytosis   . Malnutrition of moderate degree 12/17/2017  . Acute renal failure (  ARF) ([place]) 12/15/2017  . Chronic abdominal pain 12/15/2017  . Gastroenteritis 06/14/2016  . Nausea with vomiting 06/10/2016  . Uterine enlargement 06/09/2016  . Intractable nausea and vomiting 06/08/2016  . Acute infective gastroenteritis 06/08/2016  . Diarrhea 06/08/2016  . Essential hypertension 06/08/2016  . GERD (gastroesophageal reflux disease) 06/08/2016  . Abdominal pain   . Endometrial polyp 04/15/2016  . PMB (postmenopausal bleeding) 02/27/2016  . Alcoholic cirrhosis of liver without ascites ([place])   . Asthma 01/21/2016  . Hepatic cirrhosis ([place]) 09/22/2015  . Arthritis of knee, degenerative 02/05/2015  . History of Helicobacter pylori infection 10/30/2014  . Chronic [name] with cirrhosis ([place]) 04/11/2014  . De Quervain's disease (radial styloid tenosynovitis) 12/25/2013  . Anorexia 11/21/2012  . FH: colon cancer 11/21/2012  . Early satiety 10/25/2012  . Bowel habit changes 10/25/2012  . Abdominal pain, epigastric 10/25/2012  . Abdominal bloating 10/25/2012  . Constipation 10/25/2012  . Abnormal weight loss 10/25/2012  . Chronic viral [name] ([place]) 10/25/2012  . Radicular pain of left lower extremity 09/28/2012  . Back pain 09/28/2012  . Sciatica 08/10/2011  . S/P arthroscopy of left knee 08/10/2011  . Tibial plateau fracture 08/10/2011  . Pain in joint, lower leg 02/12/2011  . Stiffness of joint, not elsewhere classified, lower leg 02/12/2011  . 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 Meniscus, medial, derangement 12/29/2010  . CLOSED FRACTURE OF UPPER END OF TIBIA 08/12/2010   Teena Irani, PTA/CLT [phone]  Teena Irani 02/28/2020, 2:46 PM  [place], [place],  [address] Phone: [phone]   Fax:  [phone]  Name: Colleen Lee MRN: [id number] Date of Birth: 10-12-1955

## 2020-02-29 ENCOUNTER — Ambulatory Visit (HOSPITAL_COMMUNITY): Payer: Medicare Other | Admitting: Physical Therapy

## 2020-02-29 DIAGNOSIS — M542 Cervicalgia: Secondary | ICD-10-CM | POA: Diagnosis not present

## 2020-02-29 DIAGNOSIS — R29898 Other symptoms and signs involving the musculoskeletal system: Secondary | ICD-10-CM

## 2020-02-29 DIAGNOSIS — M25611 Stiffness of right shoulder, not elsewhere classified: Secondary | ICD-10-CM

## 2020-02-29 DIAGNOSIS — M25511 Pain in right shoulder: Secondary | ICD-10-CM | POA: Diagnosis not present

## 2020-02-29 NOTE — Therapy (Signed)
[place], [place], [address] Phone: [phone]   Fax:  [phone]  Physical Therapy Treatment  Patient Details  Name: Colleen Lee MRN: [id number] Date of Birth: November 12, 1955 Referring Provider (PT): Ashok Pall, MD   Encounter Date: 02/29/2020   PT End of Session - 02/29/20 1602    Visit Number 6    Number of Visits 12    Date for PT Re-Evaluation 03/25/20    Authorization Type Primary: UHC Medicare; Secondary: Medicaid    Progress Note Due on Visit 10    PT Start Time 1530    PT Stop Time 1556    PT Time Calculation (min) 26 min    Activity Tolerance Patient tolerated treatment well;No increased pain    Behavior During Therapy WFL for tasks assessed/performed           Past Medical History:  Diagnosis Date  . Asthma   . Chronic abdominal pain   . Chronic back pain   . Chronic constipation   . Cirrhosis ([place])    Metavir score F4 on elastography 2015  . Cyclical vomiting   . Fibroids   . Fibromyalgia   . GERD (gastroesophageal reflux disease)   . H. pylori infection 2014   treated with pylera, had to be treated again as it was not eradicated. Urea breath test then negative after subsequent treatment.   . [name]    HCV RNA positive 09/2012  . Hypertension   . Marijuana use   . Nausea and vomiting    chronic, recurrent  . PONV (postoperative nausea and vomiting)    pt thinks maybe once she had N&V  . Sciatica of left side     Past Surgical History:  Procedure Laterality Date  . BALLOON DILATION  12/20/2017   Procedure: BALLOON DILATION;  Surgeon: Danie Binder, MD;  [facility];  Service: Endoscopy;;  pyloric dilation  . BIOPSY  12/20/2017   Procedure: BIOPSY;  Surgeon: Danie Binder, MD;  [facility];  Service: Endoscopy;;  duodenal and gastric biopsy  . COLONOSCOPY  01/18/2008   TDH:RCBULA rectum.  Long redundant colon, a diminutive sigmoid polyp status  post cold biopsy removed. Hyperplastic polyp. Repeat colonoscopy June 2014 due to family history of colon cancer  . COLONOSCOPY WITH ESOPHAGOGASTRODUODENOSCOPY (EGD) N/A 11/02/2012   GTX:MIWOEHOZ gastric mucosa of doubtful, +H.pylori. Incomplete colonoscopy due to patient unable to tolerate exam, proximal colon seen. Patient refused ACBE.  Marland Kitchen COLONOSCOPY WITH PROPOFOL N/A 03/08/2013   Dr. Gala Romney: colonic polyp-removed as scribed above. Internal Hemorrhoids. Pathology did not reveal any colonic tissue, only mucus. SURVEILLANCE DUE Aug 2019  . COLONOSCOPY WITH PROPOFOL N/A 01/19/2018   Dr. Gala Romney: 6 mm cecal inflammatoy polyp, otherwise normal., Surveilance in 5 year s  . ESOPHAGEAL DILATION  12/20/2017   Procedure: ESOPHAGEAL DILATION;  Surgeon: Danie Binder, MD;  [facility];  Service: Endoscopy;;  . ESOPHAGOGASTRODUODENOSCOPY  01/18/2008   RMR: Normal esophagus, normal  stomach  . ESOPHAGOGASTRODUODENOSCOPY (EGD) WITH PROPOFOL N/A 02/09/2016   Normal esophagus, small hiatal hernia, portal hypertensive gastropathy, normal second portion of duodenum. Due in July 2019  . ESOPHAGOGASTRODUODENOSCOPY (EGD) WITH PROPOFOL N/A 12/20/2017   Dr. Oneida Alar: benign appearing esophageal stenosis s/p dilation, mild pyloric stenosis s/p biopsy and dilation, mild duodenitis.   Marland Kitchen HYSTEROSCOPY  05/11/2016   Procedure: HYSTEROSCOPY;  Surgeon: Jonnie Kind, MD;  [facility];  Service: Gynecology;;  .  KNEE SURGERY     left knee  . MULTIPLE EXTRACTIONS WITH ALVEOLOPLASTY N/A 10/15/2013   Procedure: MULTIPLE EXTRACTION WITH ALVEOLOPLASTY;  Surgeon: Gae Bon, DDS;  Location: [facility];  Service: Oral Surgery;  Laterality: N/A;  . POLYPECTOMY N/A 03/08/2013   Procedure: POLYPECTOMY;  Surgeon: Daneil Dolin, MD;  [facility];  Service: Endoscopy;  Laterality: N/A;  . POLYPECTOMY N/A 05/11/2016   Procedure: REMOVAL OF ENDOMETRIAL POLYP;  Surgeon: Jonnie Kind, MD;  [facility];  Service:  Gynecology;  Laterality: N/A;  . RESECTION DISTAL CLAVICAL Right 01/30/2019   Procedure: RESECTION DISTAL CLAVICAL;  Surgeon: Carole Civil, MD;  [facility];  Service: Orthopedics;  Laterality: Right;  . SHOULDER OPEN ROTATOR CUFF REPAIR Right 01/30/2019   Procedure: ROTATOR CUFF REPAIR SHOULDER OPEN;  Surgeon: Carole Civil, MD;  [facility];  Service: Orthopedics;  Laterality: Right;  pt to arrive at 7:30 for PICC at 8:00  . TOTAL KNEE ARTHROPLASTY Left 02/05/2015   Procedure: LEFT TOTAL KNEE ARTHROPLASTY;  Surgeon: Carole Civil, MD;  [facility];  Service: Orthopedics;  Laterality: Left;    There were no vitals filed for this visit.   Subjective Assessment - 02/29/20 1530    Subjective PT states she is tired she has been up since 4 due to having to take her husband to [place] and back.  She just wants to get the therapy over with; she is not planning on staying here for long.    Pertinent History History of Right RTC repair 01/2019.    Limitations House hold activities;Lifting    Diagnostic tests MRI of cervical spine    Patient Stated Goals To have less neck pain    Currently in Pain? Yes    Pain Score 7     Pain Location Neck    Pain Descriptors / Indicators Aching    Pain Type Chronic pain    Pain Onset More than a month ago    Pain Frequency Constant    Aggravating Factors  nothing    Pain Relieving Factors meds                             [facility] Adult PT Treatment/Exercise - 02/29/20 0001      Exercises   Exercises Neck      Neck Exercises: Seated   Neck Retraction 10 reps    Shoulder Rolls Backwards;10 reps    Other Seated Exercise cervical and thoracic excursions 5X    Other Seated Exercise scapular and cervical retraction, x10 reps each     Manual Therapy   Manual Therapy Soft tissue mobilization;Muscle Energy Technique    Manual therapy comments completed seperately from rest of treatment interventions    Soft  tissue mobilization seated to bil cervical paraspinals, Rt UT                    PT Short Term Goals - 02/21/20 1310      PT SHORT TERM GOAL #1   Title Patient will report understanding and regular compliance with HEP to improve mobility, improve stability, and decrease pain.    Time 3    Period Weeks    Status On-going    Target Date 03/04/20      PT SHORT TERM GOAL #2   Title Patient will report improvement in overall subjective complaint of at least 25% for improved QoL.  Time 3    Period Weeks    Status On-going    Target Date 03/04/20             PT Long Term Goals - 02/21/20 1310      PT LONG TERM GOAL #1   Title Patient will demonstrate WFL AROM of RT shoulder to improve functional mobility and decrease comensatory movements affecting the cervical spine.    Time 6    Period Weeks    Status On-going      PT LONG TERM GOAL #2   Title Patient will demonstrate cervical AROM WFL in order to observe the environment with improved ease and decreased pain.    Time 6    Period Weeks    Status On-going      PT LONG TERM GOAL #3   Title Patient will demonstrate improvement on FOTO of at least 10% indicating improved overall functional mobility.    Time 6    Period Weeks    Status On-going      PT LONG TERM GOAL #4   Title Patient will report improvement in overall subjective complaint of at least 50% for improved QoL.    Time 6    Period Weeks    Status On-going                 Plan - 02/29/20 1542    Clinical Impression Statement Therapist cut pt session short as requested by pt.  Pt given HEP for cervical and thoracic excursions to promote ROM.  Pt has noted mm spasm and tightness in B upper trap and will continue to benefit from skilled PT>    Personal Factors and Comorbidities Age;Time since onset of injury/illness/exacerbation;Comorbidity 3+    Comorbidities HTN, Fibromyalgia, Hx of RT shoulder RTC repair June 2020    Examination-Activity  Limitations Lift;Reach Overhead;Carry;Dressing;Hygiene/Grooming    Examination-Participation Restrictions Cleaning;Meal Prep;Community Activity;Shop    Stability/Clinical Decision Making Evolving/Moderate complexity    Rehab Potential Fair    PT Frequency 2x / week    PT Duration 6 weeks    PT Treatment/Interventions ADLs/Self Care Home Management;Aquatic Therapy;Cryotherapy;Electrical Stimulation;Moist Heat;Traction;DME Instruction;Gait training;Stair training;Functional mobility training;Therapeutic activities;Therapeutic exercise;Balance training;Neuromuscular re-education;Patient/family education;Orthotic Fit/Training;Manual techniques;Passive range of motion;Dry needling;Energy conservation;Taping    PT Next Visit Plan PROM to RT shoulder and cervical spine for improve mobility. Focus on improving RT shoulder mobility, STM to traps & levator. Continue to monitor reports of tingling/numbness in hands and feet as needed. Consider upper trap, levator and paraspinal stretches to HEP next session.    PT Home Exercise Plan 02/12/20: Cervical retraction 10x; 7/16: chin tucks in supine, seated scap retraction; 02/22/20 - neck retraction, C-spine isometrics - extension, lateral flexion, rotation, chin tuck/head lift; 7/30: cervical and thoracic excursion    Consulted and Agree with Plan of Care Patient           Patient will benefit from skilled therapeutic intervention in order to improve the following deficits and impairments:  Increased fascial restricitons, Pain, Decreased mobility, Decreased activity tolerance, Decreased endurance, Decreased range of motion, Decreased strength, Hypomobility, Impaired UE functional use  Visit Diagnosis: Cervicalgia  Stiffness of right shoulder, not elsewhere classified  Other symptoms and signs involving the musculoskeletal system  Acute pain of right shoulder     Problem List Patient Active Problem List   Diagnosis Date Noted  . Acute pancreatitis  10/17/2019  . ETOH abuse 10/17/2019  . S/P right rotator cuff repair 01/30/19 02/06/2019  . Nontraumatic complete  tear of right rotator cuff   . Arthritis of right acromioclavicular joint   . S/P total knee replacement, 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/24/2018  . Shoulder impingement, right 05/24/2018  . Dehydration   . Intractable cyclical vomiting 09/98/3382  . Pyloric stenosis in adult   . Thrombocytopenia ([place]) 12/20/2017  . Leukocytosis   . Malnutrition of moderate degree 12/17/2017  . Acute renal failure (ARF) ([place]) 12/15/2017  . Chronic abdominal pain 12/15/2017  . Gastroenteritis 06/14/2016  . Nausea with vomiting 06/10/2016  . Uterine enlargement 06/09/2016  . Intractable nausea and vomiting 06/08/2016  . Acute infective gastroenteritis 06/08/2016  . Diarrhea 06/08/2016  . Essential hypertension 06/08/2016  . GERD (gastroesophageal reflux disease) 06/08/2016  . Abdominal pain   . Endometrial polyp 04/15/2016  . PMB (postmenopausal bleeding) 02/27/2016  . Alcoholic cirrhosis of liver without ascites ([place])   . Asthma 01/21/2016  . Hepatic cirrhosis ([place]) 09/22/2015  . Arthritis of knee, degenerative 02/05/2015  . History of Helicobacter pylori infection 10/30/2014  . Chronic [name] with cirrhosis ([place]) 04/11/2014  . De Quervain's disease (radial styloid tenosynovitis) 12/25/2013  . Anorexia 11/21/2012  . FH: colon cancer 11/21/2012  . Early satiety 10/25/2012  . Bowel habit changes 10/25/2012  . Abdominal pain, epigastric 10/25/2012  . Abdominal bloating 10/25/2012  . Constipation 10/25/2012  . Abnormal weight loss 10/25/2012  . Chronic viral [name] ([place]) 10/25/2012  . Radicular pain of left lower extremity 09/28/2012  . Back pain 09/28/2012  . Sciatica 08/10/2011  . S/P arthroscopy of left knee 08/10/2011  . Tibial plateau fracture 08/10/2011  . Pain in joint, lower leg 02/12/2011  . Stiffness of joint, not elsewhere classified, lower leg 02/12/2011  . Pathological  dislocation 02/12/2011  . Meniscus, medial, derangement 12/29/2010  . CLOSED FRACTURE OF UPPER END OF TIBIA 08/12/2010    Rayetta Humphrey, PT CLT [phone] 02/29/2020, 4:05 PM  [place], [place], [address] Phone: [phone]   Fax:  [phone]  Name: Colleen Lee MRN: [id number] Date of Birth: 1955-11-05

## 2020-03-03 ENCOUNTER — Ambulatory Visit (HOSPITAL_COMMUNITY): Payer: Medicare Other | Attending: Neurosurgery | Admitting: Physical Therapy

## 2020-03-03 ENCOUNTER — Other Ambulatory Visit: Payer: Self-pay | Admitting: Orthopedic Surgery

## 2020-03-03 ENCOUNTER — Other Ambulatory Visit: Payer: Self-pay

## 2020-03-03 DIAGNOSIS — M25611 Stiffness of right shoulder, not elsewhere classified: Secondary | ICD-10-CM | POA: Diagnosis not present

## 2020-03-03 DIAGNOSIS — M542 Cervicalgia: Secondary | ICD-10-CM | POA: Insufficient documentation

## 2020-03-03 DIAGNOSIS — R29898 Other symptoms and signs involving the musculoskeletal system: Secondary | ICD-10-CM | POA: Diagnosis not present

## 2020-03-03 DIAGNOSIS — G8929 Other chronic pain: Secondary | ICD-10-CM | POA: Insufficient documentation

## 2020-03-03 DIAGNOSIS — M25511 Pain in right shoulder: Secondary | ICD-10-CM | POA: Insufficient documentation

## 2020-03-03 MED ORDER — HYDROCODONE-ACETAMINOPHEN 5-325 MG PO TABS: 1.0000 | ORAL_TABLET | Freq: Four times a day (QID) | ORAL | 0 refills | Status: DC | PRN

## 2020-03-03 NOTE — Therapy (Signed)
[place], [place], [address] Phone: [phone]   Fax:  [phone]  Physical Therapy Treatment  Patient Details  Name: Colleen Lee MRN: [id number] Date of Birth: 12-03-1955 Referring Provider (PT): Ashok Pall, MD   Encounter Date: 03/03/2020   PT End of Session - 03/03/20 1732    Visit Number 7    Number of Visits 12    Date for PT Re-Evaluation 03/25/20    Authorization Type Primary: UHC Medicare; Secondary: Medicaid    Progress Note Due on Visit 10    PT Start Time 1620    PT Stop Time 1700    PT Time Calculation (min) 40 min    Activity Tolerance Patient tolerated treatment well;No increased pain    Behavior During Therapy WFL for tasks assessed/performed           Past Medical History:  Diagnosis Date  . Asthma   . Chronic abdominal pain   . Chronic back pain   . Chronic constipation   . Cirrhosis ([place])    Metavir score F4 on elastography 2015  . Cyclical vomiting   . Fibroids   . Fibromyalgia   . GERD (gastroesophageal reflux disease)   . H. pylori infection 2014   treated with pylera, had to be treated again as it was not eradicated. Urea breath test then negative after subsequent treatment.   . [name]    HCV RNA positive 09/2012  . Hypertension   . Marijuana use   . Nausea and vomiting    chronic, recurrent  . PONV (postoperative nausea and vomiting)    pt thinks maybe once she had N&V  . Sciatica of left side     Past Surgical History:  Procedure Laterality Date  . BALLOON DILATION  12/20/2017   Procedure: BALLOON DILATION;  Surgeon: Danie Binder, MD;  [facility];  Service: Endoscopy;;  pyloric dilation  . BIOPSY  12/20/2017   Procedure: BIOPSY;  Surgeon: Danie Binder, MD;  [facility];  Service: Endoscopy;;  duodenal and gastric biopsy  . COLONOSCOPY  01/18/2008   JJH:ERDEYC rectum.  Long redundant colon, a diminutive sigmoid polyp status  post cold biopsy removed. Hyperplastic polyp. Repeat colonoscopy June 2014 due to family history of colon cancer  . COLONOSCOPY WITH ESOPHAGOGASTRODUODENOSCOPY (EGD) N/A 11/02/2012   XKG:YJEHUDJS gastric mucosa of doubtful, +H.pylori. Incomplete colonoscopy due to patient unable to tolerate exam, proximal colon seen. Patient refused ACBE.  [facility] COLONOSCOPY WITH PROPOFOL N/A 03/08/2013   Dr. Gala Romney: colonic polyp-removed as scribed above. Internal Hemorrhoids. Pathology did not reveal any colonic tissue, only mucus. SURVEILLANCE DUE Aug 2019  . COLONOSCOPY WITH PROPOFOL N/A 01/19/2018   Dr. Gala Romney: 6 mm cecal inflammatoy polyp, otherwise normal., Surveilance in 5 year s  . ESOPHAGEAL DILATION  12/20/2017   Procedure: ESOPHAGEAL DILATION;  Surgeon: Danie Binder, MD;  [facility];  Service: Endoscopy;;  . ESOPHAGOGASTRODUODENOSCOPY  01/18/2008   RMR: Normal esophagus, normal  stomach  . ESOPHAGOGASTRODUODENOSCOPY (EGD) WITH PROPOFOL N/A 02/09/2016   Normal esophagus, small hiatal hernia, portal hypertensive gastropathy, normal second portion of duodenum. Due in July 2019  . ESOPHAGOGASTRODUODENOSCOPY (EGD) WITH PROPOFOL N/A 12/20/2017   Dr. Oneida Alar: benign appearing esophageal stenosis s/p dilation, mild pyloric stenosis s/p biopsy and dilation, mild duodenitis.   [facility] Kitchen HYSTEROSCOPY  05/11/2016   Procedure: HYSTEROSCOPY;  Surgeon: Jonnie Kind, MD;  Location: [facility];  Service: Gynecology;;  .  KNEE SURGERY     left knee  . MULTIPLE EXTRACTIONS WITH ALVEOLOPLASTY N/A 10/15/2013   Procedure: MULTIPLE EXTRACTION WITH ALVEOLOPLASTY;  Surgeon: Gae Bon, DDS;  Location: [facility];  Service: Oral Surgery;  Laterality: N/A;  . POLYPECTOMY N/A 03/08/2013   Procedure: POLYPECTOMY;  Surgeon: Daneil Dolin, MD;  [facility];  Service: Endoscopy;  Laterality: N/A;  . POLYPECTOMY N/A 05/11/2016   Procedure: REMOVAL OF ENDOMETRIAL POLYP;  Surgeon: Jonnie Kind, MD;  Location: [facility];  Service:  Gynecology;  Laterality: N/A;  . RESECTION DISTAL CLAVICAL Right 01/30/2019   Procedure: RESECTION DISTAL CLAVICAL;  Surgeon: Carole Civil, MD;  [facility];  Service: Orthopedics;  Laterality: Right;  . SHOULDER OPEN ROTATOR CUFF REPAIR Right 01/30/2019   Procedure: ROTATOR CUFF REPAIR SHOULDER OPEN;  Surgeon: Carole Civil, MD;  [facility];  Service: Orthopedics;  Laterality: Right;  pt to arrive at 7:30 for PICC at 8:00  . TOTAL KNEE ARTHROPLASTY Left 02/05/2015   Procedure: LEFT TOTAL KNEE ARTHROPLASTY;  Surgeon: Carole Civil, MD;  Location: [facility];  Service: Orthopedics;  Laterality: Left;    There were no vitals filed for this visit.   Subjective Assessment - 03/03/20 1623    Subjective Pt states she has not had any pain since her appt Friday.  States her husband also massaged her over the weekend. Currently without pain.    Currently in Pain? No/denies    Pain Score 0-No pain                             [facility] Adult PT Treatment/Exercise - 03/03/20 0001      Neck Exercises: Machines for Strengthening   UBE (Upper Arm Bike) 3 minutes backward level 1      Neck Exercises: Theraband   Scapula Retraction 10 reps;Red    Shoulder Extension 10 reps;Red    Rows 10 reps;Red      Neck Exercises: Seated   Neck Retraction 10 reps    Other Seated Exercise cervical and thoracic excursions 5X    Other Seated Exercise scapular retraction, x15 reps      Manual Therapy   Manual Therapy Soft tissue mobilization;Muscle Energy Technique    Manual therapy comments completed seperately from rest of treatment interventions    Soft tissue mobilization seated to bil cervical paraspinals, Rt UT      Neck Exercises: Stretches   Levator Stretch 2 reps;20 seconds                    PT Short Term Goals - 02/21/20 1310      PT SHORT TERM GOAL #1   Title Patient will report understanding and regular compliance with HEP to improve mobility,  improve stability, and decrease pain.    Time 3    Period Weeks    Status On-going    Target Date 03/04/20      PT SHORT TERM GOAL #2   Title Patient will report improvement in overall subjective complaint of at least 25% for improved QoL.    Time 3    Period Weeks    Status On-going    Target Date 03/04/20             PT Long Term Goals - 02/21/20 1310      PT LONG TERM GOAL #1   Title Patient will demonstrate WFL AROM of RT shoulder to  improve functional mobility and decrease comensatory movements affecting the cervical spine.    Time 6    Period Weeks    Status On-going      PT LONG TERM GOAL #2   Title Patient will demonstrate cervical AROM WFL in order to observe the environment with improved ease and decreased pain.    Time 6    Period Weeks    Status On-going      PT LONG TERM GOAL #3   Title Patient will demonstrate improvement on FOTO of at least 10% indicating improved overall functional mobility.    Time 6    Period Weeks    Status On-going      PT LONG TERM GOAL #4   Title Patient will report improvement in overall subjective complaint of at least 50% for improved QoL.    Time 6    Period Weeks    Status On-going                 Plan - 03/03/20 1735    Clinical Impression Statement PT returns today painfree and has been X 3 days.  Began session with UBE and added postural tband strengthening with cues for form and posture.  UT stretch also added bilaterally. Noted improvement in ROM today and also without mm spasms with massage, only some tightness in Rt UT.    Personal Factors and Comorbidities Age;Time since onset of injury/illness/exacerbation;Comorbidity 3+    Comorbidities HTN, Fibromyalgia, Hx of RT shoulder RTC repair June 2020    Examination-Activity Limitations Lift;Reach Overhead;Carry;Dressing;Hygiene/Grooming    Examination-Participation Restrictions Cleaning;Meal Prep;Community Activity;Shop    Stability/Clinical Decision Making  Evolving/Moderate complexity    Rehab Potential Fair    PT Frequency 2x / week    PT Duration 6 weeks    PT Treatment/Interventions ADLs/Self Care Home Management;Aquatic Therapy;Cryotherapy;Electrical Stimulation;Moist Heat;Traction;DME Instruction;Gait training;Stair training;Functional mobility training;Therapeutic activities;Therapeutic exercise;Balance training;Neuromuscular re-education;Patient/family education;Orthotic Fit/Training;Manual techniques;Passive range of motion;Dry needling;Energy conservation;Taping    PT Next Visit Plan Continue to progress towards goals.    PT Home Exercise Plan 02/12/20: Cervical retraction 10x; 7/16: chin tucks in supine, seated scap retraction; 02/22/20 - neck retraction, C-spine isometrics - extension, lateral flexion, rotation, chin tuck/head lift; 7/30: cervical and thoracic excursion    Consulted and Agree with Plan of Care Patient           Patient will benefit from skilled therapeutic intervention in order to improve the following deficits and impairments:  Increased fascial restricitons, Pain, Decreased mobility, Decreased activity tolerance, Decreased endurance, Decreased range of motion, Decreased strength, Hypomobility, Impaired UE functional use  Visit Diagnosis: Cervicalgia  Stiffness of right shoulder, not elsewhere classified  Other symptoms and signs involving the musculoskeletal system     Problem List Patient Active Problem List   Diagnosis Date Noted  . Acute pancreatitis 10/17/2019  . ETOH abuse 10/17/2019  . S/P right rotator cuff repair 01/30/19 02/06/2019  . Nontraumatic complete tear of right rotator cuff   . Arthritis of right acromioclavicular joint   . S/P total knee replacement, 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/24/2018  . Shoulder impingement, right 05/24/2018  . Dehydration   . Intractable cyclical vomiting 10/93/2355  . Pyloric stenosis in adult   . Thrombocytopenia ([place]) 12/20/2017  . Leukocytosis   . Malnutrition of moderate  degree 12/17/2017  . Acute renal failure (ARF) ([place]) 12/15/2017  . Chronic abdominal pain 12/15/2017  . Gastroenteritis 06/14/2016  . Nausea with vomiting 06/10/2016  . Uterine enlargement 06/09/2016  . Intractable nausea  and vomiting 06/08/2016  . Acute infective gastroenteritis 06/08/2016  . Diarrhea 06/08/2016  . Essential hypertension 06/08/2016  . GERD (gastroesophageal reflux disease) 06/08/2016  . Abdominal pain   . Endometrial polyp 04/15/2016  . PMB (postmenopausal bleeding) 02/27/2016  . Alcoholic cirrhosis of liver without ascites ([place])   . Asthma 01/21/2016  . Hepatic cirrhosis ([place]) 09/22/2015  . Arthritis of knee, degenerative 02/05/2015  . History of Helicobacter pylori infection 10/30/2014  . Chronic [name] with cirrhosis ([place]) 04/11/2014  . De Quervain's disease (radial styloid tenosynovitis) 12/25/2013  . Anorexia 11/21/2012  . FH: colon cancer 11/21/2012  . Early satiety 10/25/2012  . Bowel habit changes 10/25/2012  . Abdominal pain, epigastric 10/25/2012  . Abdominal bloating 10/25/2012  . Constipation 10/25/2012  . Abnormal weight loss 10/25/2012  . Chronic viral [name] ([place]) 10/25/2012  . Radicular pain of left lower extremity 09/28/2012  . Back pain 09/28/2012  . Sciatica 08/10/2011  . S/P arthroscopy of left knee 08/10/2011  . Tibial plateau fracture 08/10/2011  . Pain in joint, lower leg 02/12/2011  . Stiffness of joint, not elsewhere classified, lower leg 02/12/2011  . 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 Meniscus, medial, derangement 12/29/2010  . CLOSED FRACTURE OF UPPER END OF TIBIA 08/12/2010   Teena Irani, PTA/CLT [phone]  Teena Irani 03/03/2020, 5:44 PM  [place], [place], [address] Phone: [phone]   Fax:  [phone]  Name: Colleen Lee MRN: [id number] Date of Birth: 1956-03-05

## 2020-03-03 NOTE — Telephone Encounter (Signed)
Patient requests refill on Hydrocodone/Actaminophen 5-325  Mgs.  Qty 21  Sig: Take 1 tablet by mouth every 6 (six) hours as needed for moderate pain.  Patient states she uses [employer]

## 2020-03-05 ENCOUNTER — Encounter (HOSPITAL_COMMUNITY): Payer: Medicare Other | Admitting: Physical Therapy

## 2020-03-06 ENCOUNTER — Ambulatory Visit: Payer: Medicare Other | Admitting: Nurse Practitioner

## 2020-03-06 ENCOUNTER — Ambulatory Visit (HOSPITAL_COMMUNITY): Payer: Medicare Other | Admitting: Physical Therapy

## 2020-03-06 ENCOUNTER — Encounter (HOSPITAL_COMMUNITY): Payer: Self-pay | Admitting: Physical Therapy

## 2020-03-06 ENCOUNTER — Other Ambulatory Visit: Payer: Self-pay

## 2020-03-06 DIAGNOSIS — M25611 Stiffness of right shoulder, not elsewhere classified: Secondary | ICD-10-CM | POA: Diagnosis not present

## 2020-03-06 DIAGNOSIS — R29898 Other symptoms and signs involving the musculoskeletal system: Secondary | ICD-10-CM

## 2020-03-06 DIAGNOSIS — G8929 Other chronic pain: Secondary | ICD-10-CM | POA: Diagnosis not present

## 2020-03-06 DIAGNOSIS — M542 Cervicalgia: Secondary | ICD-10-CM | POA: Diagnosis not present

## 2020-03-06 DIAGNOSIS — M25511 Pain in right shoulder: Secondary | ICD-10-CM | POA: Diagnosis not present

## 2020-03-06 NOTE — Therapy (Signed)
[facility], [place], [address] Phone: [phone]   Fax:  [phone]  Physical Therapy Treatment  Patient Details  Name: Colleen Lee MRN: [id number] Date of Birth: 03/27/1956 Referring Provider (PT): Ashok Pall, MD   Encounter Date: 03/06/2020   PT End of Session - 03/06/20 1257    Visit Number 8    Number of Visits 12    Date for PT Re-Evaluation 03/25/20    Authorization Type Primary: UHC Medicare; Secondary: Medicaid    Progress Note Due on Visit 10    PT Start Time 1300    PT Stop Time 1340    PT Time Calculation (min) 40 min    Activity Tolerance Patient tolerated treatment well;No increased pain    Behavior During Therapy WFL for tasks assessed/performed           Past Medical History:  Diagnosis Date  . Asthma   . Chronic abdominal pain   . Chronic back pain   . Chronic constipation   . Cirrhosis ([place])    Metavir score F4 on elastography 2015  . Cyclical vomiting   . Fibroids   . Fibromyalgia   . GERD (gastroesophageal reflux disease)   . H. pylori infection 2014   treated with pylera, had to be treated again as it was not eradicated. Urea breath test then negative after subsequent treatment.   . [name]    HCV RNA positive 09/2012  . Hypertension   . Marijuana use   . Nausea and vomiting    chronic, recurrent  . PONV (postoperative nausea and vomiting)    pt thinks maybe once she had N&V  . Sciatica of left side     Past Surgical History:  Procedure Laterality Date  . BALLOON DILATION  12/20/2017   Procedure: BALLOON DILATION;  Surgeon: Danie Binder, MD;  [facility];  Service: Endoscopy;;  pyloric dilation  . BIOPSY  12/20/2017   Procedure: BIOPSY;  Surgeon: Danie Binder, MD;  [facility];  Service: Endoscopy;;  duodenal and gastric biopsy  . COLONOSCOPY  01/18/2008   NLZ:JQBHAL rectum.  Long redundant colon, a diminutive sigmoid polyp status  post cold biopsy removed. Hyperplastic polyp. Repeat colonoscopy June 2014 due to family history of colon cancer  . COLONOSCOPY WITH ESOPHAGOGASTRODUODENOSCOPY (EGD) N/A 11/02/2012   PFX:[id number] gastric mucosa of doubtful, +H.pylori. Incomplete colonoscopy due to patient unable to tolerate exam, proximal colon seen. Patient refused ACBE.  [facility] COLONOSCOPY WITH PROPOFOL N/A 03/08/2013   Dr. Gala Romney: colonic polyp-removed as scribed above. Internal Hemorrhoids. Pathology did not reveal any colonic tissue, only mucus. SURVEILLANCE DUE Aug 2019  . COLONOSCOPY WITH PROPOFOL N/A 01/19/2018   Dr. Gala Romney: 6 mm cecal inflammatoy polyp, otherwise normal., Surveilance in 5 year s  . ESOPHAGEAL DILATION  12/20/2017   Procedure: ESOPHAGEAL DILATION;  Surgeon: Danie Binder, MD;  [facility];  Service: Endoscopy;;  . ESOPHAGOGASTRODUODENOSCOPY  01/18/2008   RMR: Normal esophagus, normal  stomach  . ESOPHAGOGASTRODUODENOSCOPY (EGD) WITH PROPOFOL N/A 02/09/2016   Normal esophagus, small hiatal hernia, portal hypertensive gastropathy, normal second portion of duodenum. Due in July 2019  . ESOPHAGOGASTRODUODENOSCOPY (EGD) WITH PROPOFOL N/A 12/20/2017   Dr. Oneida Alar: benign appearing esophageal stenosis s/p dilation, mild pyloric stenosis s/p biopsy and dilation, mild duodenitis.   [facility] Kitchen HYSTEROSCOPY  05/11/2016   Procedure: HYSTEROSCOPY;  Surgeon: Jonnie Kind, MD;  [facility];  Service: Gynecology;;  .  KNEE SURGERY     left knee  . MULTIPLE EXTRACTIONS WITH ALVEOLOPLASTY N/A 10/15/2013   Procedure: MULTIPLE EXTRACTION WITH ALVEOLOPLASTY;  Surgeon: Gae Bon, DDS;  Location: [facility];  Service: Oral Surgery;  Laterality: N/A;  . POLYPECTOMY N/A 03/08/2013   Procedure: POLYPECTOMY;  Surgeon: Daneil Dolin, MD;  [facility];  Service: Endoscopy;  Laterality: N/A;  . POLYPECTOMY N/A 05/11/2016   Procedure: REMOVAL OF ENDOMETRIAL POLYP;  Surgeon: Jonnie Kind, MD;  [facility];  Service:  Gynecology;  Laterality: N/A;  . RESECTION DISTAL CLAVICAL Right 01/30/2019   Procedure: RESECTION DISTAL CLAVICAL;  Surgeon: Carole Civil, MD;  [facility];  Service: Orthopedics;  Laterality: Right;  . SHOULDER OPEN ROTATOR CUFF REPAIR Right 01/30/2019   Procedure: ROTATOR CUFF REPAIR SHOULDER OPEN;  Surgeon: Carole Civil, MD;  [facility];  Service: Orthopedics;  Laterality: Right;  pt to arrive at 7:30 for PICC at 8:00  . TOTAL KNEE ARTHROPLASTY Left 02/05/2015   Procedure: LEFT TOTAL KNEE ARTHROPLASTY;  Surgeon: Carole Civil, MD;  [facility];  Service: Orthopedics;  Laterality: Left;    There were no vitals filed for this visit.   Subjective Assessment - 03/06/20 1258    Subjective Patient states that her neck is good. She has not had much pain. She continues to have pain with housework. She has good days and bad days. She feels that what we've been doing is helpful. Patient states 50% improvement with physical therapy intervention.    Currently in Pain? Yes    Pain Score 5     Pain Location Neck                             [facility] Adult PT Treatment/Exercise - 03/06/20 0001      Neck Exercises: Machines for Strengthening   UBE (Upper Arm Bike) 3 minutes backward level 1      Neck Exercises: Theraband   Shoulder Extension 10 reps;Red    Shoulder Extension Limitations 2 sets    Rows 10 reps;Red    Rows Limitations 2 sets       Neck Exercises: Seated   Neck Retraction 10 reps    Neck Retraction Limitations 3'' holds. Cues for form. 2 sets    Shoulder Rolls Backwards;10 reps    Other Seated Exercise t/sp extension over chair 10x 5 second holds    Other Seated Exercise scapular retraction, x15 reps; scapular retraction depression with GH ER 2x10      Manual Therapy   Manual Therapy Soft tissue mobilization    Manual therapy comments completed seperately from rest of treatment interventions    Soft tissue mobilization seated  instrumented assist STM with theragun to bilateral paraspinals and UT      Neck Exercises: Stretches   Levator Stretch 2 reps;20 seconds                  PT Education - 03/06/20 1256    Education Details Patient educated on HEP, mechanics of exercise    Person(s) Educated Patient    Methods Explanation;Demonstration    Comprehension Verbalized understanding;Returned demonstration            PT Short Term Goals - 03/06/20 1315      PT SHORT TERM GOAL #1   Title Patient will report understanding and regular compliance with HEP to improve mobility, improve stability, and decrease pain.  Time 3    Period Weeks    Status Achieved    Target Date 03/04/20      PT SHORT TERM GOAL #2   Title Patient will report improvement in overall subjective complaint of at least 25% for improved QoL.    Time 3    Period Weeks    Status Achieved    Target Date 03/04/20             PT Long Term Goals - 02/21/20 1310      PT LONG TERM GOAL #1   Title Patient will demonstrate WFL AROM of RT shoulder to improve functional mobility and decrease comensatory movements affecting the cervical spine.    Time 6    Period Weeks    Status On-going      PT LONG TERM GOAL #2   Title Patient will demonstrate cervical AROM WFL in order to observe the environment with improved ease and decreased pain.    Time 6    Period Weeks    Status On-going      PT LONG TERM GOAL #3   Title Patient will demonstrate improvement on FOTO of at least 10% indicating improved overall functional mobility.    Time 6    Period Weeks    Status On-going      PT LONG TERM GOAL #4   Title Patient will report improvement in overall subjective complaint of at least 50% for improved QoL.    Time 6    Period Weeks    Status On-going                 Plan - 03/06/20 1257    Clinical Impression Statement Patient has met 2/2 short term goals with ability to complete HEP and improvement in symptoms. Patient  requires min verbal curing and demonstration for mechanics of cervical retraction in seated. Patient able to complete resistance band exercises with good mechanics following initial demonstration but does require some verbal cueing for posture. Patient tolerates manual therapy well and states improvement in symptoms following session. Patient will continue to benefit from skilled physical therapy in order to reduce impairment and improve function.    Personal Factors and Comorbidities Age;Time since onset of injury/illness/exacerbation;Comorbidity 3+    Comorbidities HTN, Fibromyalgia, Hx of RT shoulder RTC repair June 2020    Examination-Activity Limitations Lift;Reach Overhead;Carry;Dressing;Hygiene/Grooming    Examination-Participation Restrictions Cleaning;Meal Prep;Community Activity;Shop    Stability/Clinical Decision Making Evolving/Moderate complexity    Rehab Potential Fair    PT Frequency 2x / week    PT Duration 6 weeks    PT Treatment/Interventions ADLs/Self Care Home Management;Aquatic Therapy;Cryotherapy;Electrical Stimulation;Moist Heat;Traction;DME Instruction;Gait training;Stair training;Functional mobility training;Therapeutic activities;Therapeutic exercise;Balance training;Neuromuscular re-education;Patient/family education;Orthotic Fit/Training;Manual techniques;Passive range of motion;Dry needling;Energy conservation;Taping    PT Next Visit Plan Continue to progress towards goals.    PT Home Exercise Plan 02/12/20: Cervical retraction 10x; 7/16: chin tucks in supine, seated scap retraction; 02/22/20 - neck retraction, C-spine isometrics - extension, lateral flexion, rotation, chin tuck/head lift; 7/30: cervical and thoracic excursion    Consulted and Agree with Plan of Care Patient           Patient will benefit from skilled therapeutic intervention in order to improve the following deficits and impairments:  Increased fascial restricitons, Pain, Decreased mobility, Decreased  activity tolerance, Decreased endurance, Decreased range of motion, Decreased strength, Hypomobility, Impaired UE functional use  Visit Diagnosis: Cervicalgia  Stiffness of right shoulder, not elsewhere classified  Other symptoms and  signs involving the musculoskeletal system     Problem List Patient Active Problem List   Diagnosis Date Noted  . Acute pancreatitis 10/17/2019  . ETOH abuse 10/17/2019  . S/P right rotator cuff repair 01/30/19 02/06/2019  . Nontraumatic complete tear of right rotator cuff   . Arthritis of right acromioclavicular joint   . S/P total knee replacement, 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/24/2018  . Shoulder impingement, right 05/24/2018  . Dehydration   . Intractable cyclical vomiting 23/41/4436  . Pyloric stenosis in adult   . Thrombocytopenia ([place]) 12/20/2017  . Leukocytosis   . Malnutrition of moderate degree 12/17/2017  . Acute renal failure (ARF) ([place]) 12/15/2017  . Chronic abdominal pain 12/15/2017  . Gastroenteritis 06/14/2016  . Nausea with vomiting 06/10/2016  . Uterine enlargement 06/09/2016  . Intractable nausea and vomiting 06/08/2016  . Acute infective gastroenteritis 06/08/2016  . Diarrhea 06/08/2016  . Essential hypertension 06/08/2016  . GERD (gastroesophageal reflux disease) 06/08/2016  . Abdominal pain   . Endometrial polyp 04/15/2016  . PMB (postmenopausal bleeding) 02/27/2016  . Alcoholic cirrhosis of liver without ascites ([place])   . Asthma 01/21/2016  . Hepatic cirrhosis ([place]) 09/22/2015  . Arthritis of knee, degenerative 02/05/2015  . History of Helicobacter pylori infection 10/30/2014  . Chronic [name] with cirrhosis ([place]) 04/11/2014  . De Quervain's disease (radial styloid tenosynovitis) 12/25/2013  . Anorexia 11/21/2012  . FH: colon cancer 11/21/2012  . Early satiety 10/25/2012  . Bowel habit changes 10/25/2012  . Abdominal pain, epigastric 10/25/2012  . Abdominal bloating 10/25/2012  . Constipation 10/25/2012  . Abnormal  weight loss 10/25/2012  . Chronic viral [name] ([place]) 10/25/2012  . Radicular pain of left lower extremity 09/28/2012  . Back pain 09/28/2012  . Sciatica 08/10/2011  . S/P arthroscopy of left knee 08/10/2011  . Tibial plateau fracture 08/10/2011  . Pain in joint, lower leg 02/12/2011  . Stiffness of joint, not elsewhere classified, lower leg 02/12/2011  . 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 Meniscus, medial, derangement 12/29/2010  . CLOSED FRACTURE OF UPPER END OF TIBIA 08/12/2010    1:41 PM, 03/06/20 Mearl Latin PT, DPT Physical Therapist at [place], [place], [address] Phone: [phone]   Fax:  [phone]  Name: Colleen Lee MRN: [id number] Date of Birth: 1955-12-21

## 2020-03-07 ENCOUNTER — Inpatient Hospital Stay (HOSPITAL_COMMUNITY): Admission: RE | Admit: 2020-03-07 | Payer: Medicare Other | Source: Ambulatory Visit

## 2020-03-10 ENCOUNTER — Other Ambulatory Visit: Payer: Self-pay

## 2020-03-10 ENCOUNTER — Encounter (HOSPITAL_COMMUNITY): Payer: Self-pay | Admitting: Physical Therapy

## 2020-03-10 ENCOUNTER — Ambulatory Visit (HOSPITAL_COMMUNITY): Payer: Medicare Other | Admitting: Physical Therapy

## 2020-03-10 DIAGNOSIS — R29898 Other symptoms and signs involving the musculoskeletal system: Secondary | ICD-10-CM

## 2020-03-10 DIAGNOSIS — M25611 Stiffness of right shoulder, not elsewhere classified: Secondary | ICD-10-CM

## 2020-03-10 DIAGNOSIS — M25511 Pain in right shoulder: Secondary | ICD-10-CM | POA: Diagnosis not present

## 2020-03-10 DIAGNOSIS — G8929 Other chronic pain: Secondary | ICD-10-CM | POA: Diagnosis not present

## 2020-03-10 DIAGNOSIS — M542 Cervicalgia: Secondary | ICD-10-CM

## 2020-03-10 MED ORDER — HYDROCODONE-ACETAMINOPHEN 5-325 MG PO TABS: 1.0000 | ORAL_TABLET | Freq: Four times a day (QID) | ORAL | 0 refills | Status: DC | PRN

## 2020-03-10 NOTE — Therapy (Signed)
[place], [place], [address] Phone: [phone]   Fax:  [phone]  Physical Therapy Treatment  Patient Details  Name: Colleen Lee MRN: [id number] Date of Birth: 1955/10/04 Referring Provider (PT): Ashok Pall, MD   Encounter Date: 03/10/2020   PT End of Session - 03/10/20 1449    Visit Number 9    Number of Visits 12    Date for PT Re-Evaluation 03/25/20    Authorization Type Primary: [employer] Medicare; Secondary: Medicaid    Progress Note Due on Visit 10    PT Start Time 1448    PT Stop Time 1527    PT Time Calculation (min) 39 min    Activity Tolerance Patient tolerated treatment well;No increased pain    Behavior During Therapy WFL for tasks assessed/performed           Past Medical History:  Diagnosis Date  . Asthma   . Chronic abdominal pain   . Chronic back pain   . Chronic constipation   . Cirrhosis ([place])    Metavir score F4 on elastography 2015  . Cyclical vomiting   . Fibroids   . Fibromyalgia   . GERD (gastroesophageal reflux disease)   . H. pylori infection 2014   treated with pylera, had to be treated again as it was not eradicated. Urea breath test then negative after subsequent treatment.   . [name]    HCV RNA positive 09/2012  . Hypertension   . Marijuana use   . Nausea and vomiting    chronic, recurrent  . PONV (postoperative nausea and vomiting)    pt thinks maybe once she had N&V  . Sciatica of left side     Past Surgical History:  Procedure Laterality Date  . BALLOON DILATION  12/20/2017   Procedure: BALLOON DILATION;  Surgeon: Danie Binder, MD;  [facility];  Service: Endoscopy;;  pyloric dilation  . BIOPSY  12/20/2017   Procedure: BIOPSY;  Surgeon: Danie Binder, MD;  [facility];  Service: Endoscopy;;  duodenal and gastric biopsy  . COLONOSCOPY  01/18/2008   MPN:TIRWER rectum.  Long redundant colon, a diminutive sigmoid polyp status  post cold biopsy removed. Hyperplastic polyp. Repeat colonoscopy June 2014 due to family history of colon cancer  . COLONOSCOPY WITH ESOPHAGOGASTRODUODENOSCOPY (EGD) N/A 11/02/2012   XVQ:MGQQPYPP gastric mucosa of doubtful, +H.pylori. Incomplete colonoscopy due to patient unable to tolerate exam, proximal colon seen. Patient refused ACBE.  Marland Kitchen COLONOSCOPY WITH PROPOFOL N/A 03/08/2013   Dr. Gala Romney: colonic polyp-removed as scribed above. Internal Hemorrhoids. Pathology did not reveal any colonic tissue, only mucus. SURVEILLANCE DUE Aug 2019  . COLONOSCOPY WITH PROPOFOL N/A 01/19/2018   Dr. Gala Romney: 6 mm cecal inflammatoy polyp, otherwise normal., Surveilance in 5 year s  . ESOPHAGEAL DILATION  12/20/2017   Procedure: ESOPHAGEAL DILATION;  Surgeon: Danie Binder, MD;  [facility];  Service: Endoscopy;;  . ESOPHAGOGASTRODUODENOSCOPY  01/18/2008   RMR: Normal esophagus, normal  stomach  . ESOPHAGOGASTRODUODENOSCOPY (EGD) WITH PROPOFOL N/A 02/09/2016   Normal esophagus, small hiatal hernia, portal hypertensive gastropathy, normal second portion of duodenum. Due in July 2019  . ESOPHAGOGASTRODUODENOSCOPY (EGD) WITH PROPOFOL N/A 12/20/2017   Dr. Oneida Alar: benign appearing esophageal stenosis s/p dilation, mild pyloric stenosis s/p biopsy and dilation, mild duodenitis.   Marland Kitchen HYSTEROSCOPY  05/11/2016   Procedure: HYSTEROSCOPY;  Surgeon: Jonnie Kind, MD;  Location: [facility];  Service: Gynecology;;  .  KNEE SURGERY     left knee  . MULTIPLE EXTRACTIONS WITH ALVEOLOPLASTY N/A 10/15/2013   Procedure: MULTIPLE EXTRACTION WITH ALVEOLOPLASTY;  Surgeon: Gae Bon, DDS;  Location: [facility];  Service: Oral Surgery;  Laterality: N/A;  . POLYPECTOMY N/A 03/08/2013   Procedure: POLYPECTOMY;  Surgeon: Daneil Dolin, MD;  Location: [facility];  Service: Endoscopy;  Laterality: N/A;  . POLYPECTOMY N/A 05/11/2016   Procedure: REMOVAL OF ENDOMETRIAL POLYP;  Surgeon: Jonnie Kind, MD;  Location: [facility];  Service:  Gynecology;  Laterality: N/A;  . RESECTION DISTAL CLAVICAL Right 01/30/2019   Procedure: RESECTION DISTAL CLAVICAL;  Surgeon: Carole Civil, MD;  Location: [facility];  Service: Orthopedics;  Laterality: Right;  . SHOULDER OPEN ROTATOR CUFF REPAIR Right 01/30/2019   Procedure: ROTATOR CUFF REPAIR SHOULDER OPEN;  Surgeon: Carole Civil, MD;  Location: [facility];  Service: Orthopedics;  Laterality: Right;  pt to arrive at 7:30 for PICC at 8:00  . TOTAL KNEE ARTHROPLASTY Left 02/05/2015   Procedure: LEFT TOTAL KNEE ARTHROPLASTY;  Surgeon: Carole Civil, MD;  Location: [facility];  Service: Orthopedics;  Laterality: Left;    There were no vitals filed for this visit.   Subjective Assessment - 03/10/20 1451    Subjective States she has good days and bad days and Saturday was a bad day because of the rain. Today not so bad just 5/10 pain in neck.    Currently in Pain? 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    Referring Provider (PT) Ashok Pall, MD                         [facility] Adult PT Treatment/Exercise - 03/10/20 0001      Neck Exercises: Standing   Neck Retraction 20 reps;5 secs    Other Standing Exercises towel roll down spine at wall - 5 minutes - then with retraction 3x10; then shoulder w's 3x10 5" holds    Other Standing Exercises shoulder flexion with dowel - standing at wall 2x 10       Neck Exercises: Seated   Cervical Rotation Both;20 reps    Lateral Flexion Both;20 reps    Shoulder Rolls Backwards;10 reps    Other Seated Exercise scapular protraction x20 5" holds B       Manual Therapy   Manual Therapy Soft tissue mobilization    Manual therapy comments completed seperately from rest of treatment interventions    Soft tissue  mobilization seated instrumented assist STM with theragun to bilateral paraspinals and UT focus on right and right UE.                   PT Education - 03/10/20 1522    Education Details Educated patient in current condition, and discussed theragun as patient was interested in getting one for herself    Person(s) Educated Patient    Methods Explanation    Comprehension Verbalized understanding            PT Short Term Goals - 03/06/20 1315      PT SHORT TERM GOAL #1   Title Patient will  report understanding and regular compliance with HEP to improve mobility, improve stability, and decrease pain.    Time 3    Period Weeks    Status Achieved    Target Date 03/04/20      PT SHORT TERM GOAL #2   Title Patient will report improvement in overall subjective complaint of at least 25% for improved QoL.    Time 3    Period Weeks    Status Achieved    Target Date 03/04/20             PT Long Term Goals - 02/21/20 1310      PT LONG TERM GOAL #1   Title Patient will demonstrate WFL AROM of RT shoulder to improve functional mobility and decrease comensatory movements affecting the cervical spine.    Time 6    Period Weeks    Status On-going      PT LONG TERM GOAL #2   Title Patient will demonstrate cervical AROM WFL in order to observe the environment with improved ease and decreased pain.    Time 6    Period Weeks    Status On-going      PT LONG TERM GOAL #3   Title Patient will demonstrate improvement on FOTO of at least 10% indicating improved overall functional mobility.    Time 6    Period Weeks    Status On-going      PT LONG TERM GOAL #4   Title Patient will report improvement in overall subjective complaint of at least 50% for improved QoL.    Time 6    Period Weeks    Status On-going                 Plan - 03/10/20 1521    Clinical Impression Statement Patient tolerated session well. Able to perform full shoulder flexion with down and good  thoracic posture with towel roll. Minor discomfort noted with exercises, able to decrease with percussion gun. Patient interested in obtaining percussion fun for home use. Educated patient in how to get one if she is interested. Will continue with current POC.  Patient reported decreased pain and stiffness end of session reporting 3/10.    Personal Factors and Comorbidities Age;Time since onset of injury/illness/exacerbation;Comorbidity 3+    Comorbidities HTN, Fibromyalgia, Hx of RT shoulder RTC repair June 2020    Examination-Activity Limitations Lift;Reach Overhead;Carry;Dressing;Hygiene/Grooming    Examination-Participation Restrictions Cleaning;Meal Prep;Community Activity;Shop    Stability/Clinical Decision Making Evolving/Moderate complexity    Rehab Potential Fair    PT Frequency 2x / week    PT Duration 6 weeks    PT Treatment/Interventions ADLs/Self Care Home Management;Aquatic Therapy;Cryotherapy;Electrical Stimulation;Moist Heat;Traction;DME Instruction;Gait training;Stair training;Functional mobility training;Therapeutic activities;Therapeutic exercise;Balance training;Neuromuscular re-education;Patient/family education;Orthotic Fit/Training;Manual techniques;Passive range of motion;Dry needling;Energy conservation;Taping    PT Next Visit Plan Continue to progress towards goals.    PT Home Exercise Plan 02/12/20: Cervical retraction 10x; 7/16: chin tucks in supine, seated scap retraction; 02/22/20 - neck retraction, C-spine isometrics - extension, lateral flexion, rotation, chin tuck/head lift; 7/30: cervical and thoracic excursion    Consulted and Agree with Plan of Care Patient           Patient will benefit from skilled therapeutic intervention in order to improve the following deficits and impairments:  Increased fascial restricitons, Pain, Decreased mobility, Decreased activity tolerance, Decreased endurance, Decreased range of motion, Decreased strength, Hypomobility, Impaired UE  functional use  Visit Diagnosis: Cervicalgia  Stiffness of right shoulder, not elsewhere  classified  Other symptoms and signs involving the musculoskeletal system  Acute pain of right shoulder     Problem List Patient Active Problem List   Diagnosis Date Noted  . Acute pancreatitis 10/17/2019  . ETOH abuse 10/17/2019  . S/P right rotator cuff repair 01/30/19 02/06/2019  . Nontraumatic complete tear of right rotator cuff   . Arthritis of right acromioclavicular joint   . S/P total knee replacement, 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/24/2018  . Shoulder impingement, right 05/24/2018  . Dehydration   . Intractable cyclical vomiting 96/29/5284  . Pyloric stenosis in adult   . Thrombocytopenia ([place]) 12/20/2017  . Leukocytosis   . Malnutrition of moderate degree 12/17/2017  . Acute renal failure (ARF) ([place]) 12/15/2017  . Chronic abdominal pain 12/15/2017  . Gastroenteritis 06/14/2016  . Nausea with vomiting 06/10/2016  . Uterine enlargement 06/09/2016  . Intractable nausea and vomiting 06/08/2016  . Acute infective gastroenteritis 06/08/2016  . Diarrhea 06/08/2016  . Essential hypertension 06/08/2016  . GERD (gastroesophageal reflux disease) 06/08/2016  . Abdominal pain   . Endometrial polyp 04/15/2016  . PMB (postmenopausal bleeding) 02/27/2016  . Alcoholic cirrhosis of liver without ascites ([place])   . Asthma 01/21/2016  . Hepatic cirrhosis ([place]) 09/22/2015  . Arthritis of knee, degenerative 02/05/2015  . History of Helicobacter pylori infection 10/30/2014  . Chronic [name] with cirrhosis ([place]) 04/11/2014  . De Quervain's disease (radial styloid tenosynovitis) 12/25/2013  . Anorexia 11/21/2012  . FH: colon cancer 11/21/2012  . Early satiety 10/25/2012  . Bowel habit changes 10/25/2012  . Abdominal pain, epigastric 10/25/2012  . Abdominal bloating 10/25/2012  . Constipation 10/25/2012  . Abnormal weight loss 10/25/2012  . Chronic viral [name] ([place]) 10/25/2012  . Radicular  pain of left lower extremity 09/28/2012  . Back pain 09/28/2012  . Sciatica 08/10/2011  . S/P arthroscopy of left knee 08/10/2011  . Tibial plateau fracture 08/10/2011  . Pain in joint, lower leg 02/12/2011  . Stiffness of joint, not elsewhere classified, lower leg 02/12/2011  . 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 Meniscus, medial, derangement 12/29/2010  . CLOSED FRACTURE OF UPPER END OF TIBIA 08/12/2010    3:27 PM, 03/10/20 Jerene Pitch, DPT Physical Therapy with [facility]  [phone] office  [place] [address] [place], [place], [address] Phone: [phone]   Fax:  [phone]  Name: Colleen Lee MRN: [id number] Date of Birth: [birth year]

## 2020-03-10 NOTE — Telephone Encounter (Signed)
Hydrocodone-Acetaminophen 5/325 mg  Qty 21 Tablets  Take 1 tablet by mouth every 6 (six) hours as needed for moderate pain.  PATIENT USES [facility]     Patient was saying she took Oxycodone not Hydrocodone

## 2020-03-12 ENCOUNTER — Encounter (HOSPITAL_COMMUNITY): Payer: Self-pay | Admitting: Physical Therapy

## 2020-03-12 ENCOUNTER — Ambulatory Visit (HOSPITAL_COMMUNITY): Payer: Medicare Other | Admitting: Physical Therapy

## 2020-03-12 ENCOUNTER — Other Ambulatory Visit: Payer: Self-pay

## 2020-03-12 DIAGNOSIS — R29898 Other symptoms and signs involving the musculoskeletal system: Secondary | ICD-10-CM

## 2020-03-12 DIAGNOSIS — M25611 Stiffness of right shoulder, not elsewhere classified: Secondary | ICD-10-CM | POA: Diagnosis not present

## 2020-03-12 DIAGNOSIS — M542 Cervicalgia: Secondary | ICD-10-CM | POA: Diagnosis not present

## 2020-03-12 DIAGNOSIS — G8929 Other chronic pain: Secondary | ICD-10-CM | POA: Diagnosis not present

## 2020-03-12 DIAGNOSIS — M25511 Pain in right shoulder: Secondary | ICD-10-CM | POA: Diagnosis not present

## 2020-03-12 NOTE — Therapy (Signed)
[place], [place], [address] Phone: [phone]   Fax:  [phone]  Physical Therapy Treatment / Progress Note  Patient Details  Name: Colleen Lee MRN: [id number] Date of Birth: 1956-06-28 Referring Provider (PT): Ashok Pall, MD   Encounter Date: 03/12/2020   Progress Note Reporting Period 02/12/20 to 03/12/20  See note below for Objective Data and Assessment of Progress/Goals.        PT End of Session - 03/12/20 1351    Visit Number 10    Number of Visits 12    Date for PT Re-Evaluation 03/25/20 (PN completed 03/12/20)   Authorization Type Primary: [employer] Medicare; Secondary: Medicaid    Progress Note Due on Visit 10    PT Start Time 1347    PT Stop Time 1425    PT Time Calculation (min) 38 min    Activity Tolerance Patient tolerated treatment well;No increased pain    Behavior During Therapy WFL for tasks assessed/performed           Past Medical History:  Diagnosis Date  . Asthma   . Chronic abdominal pain   . Chronic back pain   . Chronic constipation   . Cirrhosis ([place])    Metavir score F4 on elastography 2015  . Cyclical vomiting   . Fibroids   . Fibromyalgia   . GERD (gastroesophageal reflux disease)   . H. pylori infection 2014   treated with pylera, had to be treated again as it was not eradicated. Urea breath test then negative after subsequent treatment.   . [name]    HCV RNA positive 09/2012  . Hypertension   . Marijuana use   . Nausea and vomiting    chronic, recurrent  . PONV (postoperative nausea and vomiting)    pt thinks maybe once she had N&V  . Sciatica of left side     Past Surgical History:  Procedure Laterality Date  . BALLOON DILATION  12/20/2017   Procedure: BALLOON DILATION;  Surgeon: Danie Binder, MD;  [facility];  Service: Endoscopy;;  pyloric dilation  . BIOPSY  12/20/2017   Procedure: BIOPSY;  Surgeon: Danie Binder, MD;  [facility];  Service: Endoscopy;;  duodenal and gastric biopsy  . COLONOSCOPY  01/18/2008   CHE:NIDPOE rectum.  Long redundant colon, a diminutive sigmoid polyp status post cold biopsy removed. Hyperplastic polyp. Repeat colonoscopy June 2014 due to family history of colon cancer  . COLONOSCOPY WITH ESOPHAGOGASTRODUODENOSCOPY (EGD) N/A 11/02/2012   UMP:NTIRWERX gastric mucosa of doubtful, +H.pylori. Incomplete colonoscopy due to patient unable to tolerate exam, proximal colon seen. Patient refused ACBE.  Marland Kitchen COLONOSCOPY WITH PROPOFOL N/A 03/08/2013   Dr. Gala Romney: colonic polyp-removed as scribed above. Internal Hemorrhoids. Pathology did not reveal any colonic tissue, only mucus. SURVEILLANCE DUE Aug 2019  . COLONOSCOPY WITH PROPOFOL N/A 01/19/2018   Dr. Gala Romney: 6 mm cecal inflammatoy polyp, otherwise normal., Surveilance in 5 year s  . ESOPHAGEAL DILATION  12/20/2017   Procedure: ESOPHAGEAL DILATION;  Surgeon: Danie Binder, MD;  [facility];  Service: Endoscopy;;  . ESOPHAGOGASTRODUODENOSCOPY  01/18/2008   RMR: Normal esophagus, normal  stomach  . ESOPHAGOGASTRODUODENOSCOPY (EGD) WITH PROPOFOL N/A 02/09/2016   Normal esophagus, small hiatal hernia, portal hypertensive gastropathy, normal second portion of duodenum. Due in July 2019  . ESOPHAGOGASTRODUODENOSCOPY (EGD) WITH PROPOFOL N/A 12/20/2017   Dr. Oneida Alar: benign appearing esophageal stenosis s/p dilation, mild pyloric stenosis  s/p biopsy and dilation, mild duodenitis.   [facility] Kitchen HYSTEROSCOPY  05/11/2016   Procedure: HYSTEROSCOPY;  Surgeon: Jonnie Kind, MD;  Location: [facility];  Service: Gynecology;;  . KNEE SURGERY     left knee  . MULTIPLE EXTRACTIONS WITH ALVEOLOPLASTY N/A 10/15/2013   Procedure: MULTIPLE EXTRACTION WITH ALVEOLOPLASTY;  Surgeon: Gae Bon, DDS;  Location: [place];  Service: Oral Surgery;  Laterality: N/A;  . POLYPECTOMY N/A 03/08/2013   Procedure: POLYPECTOMY;  Surgeon: Daneil Dolin, MD;  Location: [facility];  Service:  Endoscopy;  Laterality: N/A;  . POLYPECTOMY N/A 05/11/2016   Procedure: REMOVAL OF ENDOMETRIAL POLYP;  Surgeon: Jonnie Kind, MD;  Location: [facility];  Service: Gynecology;  Laterality: N/A;  . RESECTION DISTAL CLAVICAL Right 01/30/2019   Procedure: RESECTION DISTAL CLAVICAL;  Surgeon: Carole Civil, MD;  Location: [facility];  Service: Orthopedics;  Laterality: Right;  . SHOULDER OPEN ROTATOR CUFF REPAIR Right 01/30/2019   Procedure: ROTATOR CUFF REPAIR SHOULDER OPEN;  Surgeon: Carole Civil, MD;  Location: [facility];  Service: Orthopedics;  Laterality: Right;  pt to arrive at 7:30 for PICC at 8:00  . TOTAL KNEE ARTHROPLASTY Left 02/05/2015   Procedure: LEFT TOTAL KNEE ARTHROPLASTY;  Surgeon: Carole Civil, MD;  Location: [facility];  Service: Orthopedics;  Laterality: Left;    There were no vitals filed for this visit.   Subjective Assessment - 03/12/20 1350    Subjective Patient reported 50% improvement since beginning therapy. Patient reported a 9/10 pain currently.    Currently in Pain? 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 LT shoulder flexion and abduction WFL.     Cervical Flexion 25 - "feels alright"    was 30   Cervical Extension 35 - "a little pain"    was 28   Cervical - Right Side Bend 20 - painful   was 18   Cervical  - Left Side Bend 30 - No pain    Cervical - Right Rotation 60 - No pain    Cervical - Left Rotation 60 - No pain                         [facility] Adult PT Treatment/Exercise - 03/12/20 0001      Neck Exercises: Seated   Neck Retraction 10 reps   5'' holds   Cervical Rotation Both;20 reps    Lateral Flexion Both;20 reps    Shoulder Rolls Backwards;10 reps    Other Seated Exercise Serratus punches x20 bil UE in sitting  PT Short Term Goals - 03/06/20 1315      PT SHORT TERM GOAL #1   Title Patient will report understanding and regular compliance with HEP to improve mobility, improve stability, and decrease pain.    Time 3    Period Weeks    Status Achieved    Target Date 03/04/20      PT SHORT TERM GOAL #2   Title Patient will report improvement in overall subjective complaint of at least 25% for improved QoL.    Time 3    Period Weeks    Status Achieved    Target Date 03/04/20             PT Long Term Goals - 03/12/20 1351      PT LONG TERM GOAL #1   Title Patient will demonstrate WFL AROM of RT shoulder to improve functional mobility and decrease comensatory movements affecting the cervical spine.    Baseline 03/12/20: See objective measures    Time 6    Period Weeks    Status On-going      PT LONG TERM GOAL #2   Title Patient will demonstrate cervical AROM WFL in order to observe the environment with improved ease and decreased pain.    Baseline 03/12/20: See objective measures    Time 6    Period Weeks    Status On-going      PT LONG TERM GOAL #3   Title Patient will demonstrate improvement on FOTO of at least 10% indicating improved overall functional mobility.    Baseline 03/12/20: See objective measures    Time 6    Period Weeks    Status On-going      PT LONG TERM GOAL #4   Title Patient will report improvement in overall subjective complaint of at least 50% for improved QoL.    Baseline 03/12/20: Patient reports  a 50% improvment since beginning therapy    Time 6    Period Weeks    Status Achieved                 Plan - 03/12/20 1416    Clinical Impression Statement Performed re-assessment of patient's progress towards goals. Patient has achieved 2 out of 2 short term goals. Patient has achieved 1 out of 4 long term goals. Patient continues to demonstrate decreased cervical and right shoulder AROM with some improvements noted with this. Minimal improvements were noted in FOTO score. Remainder of session worked on cervical AROM and scapular strengthening. Plan to continue for remaining 2 appointments of original POC with focus on making patient independent with an advanced HEP and discharge pending patient presentation. Patient did report an improvement in neck pain overall at end of session.    Personal Factors and Comorbidities Age;Time since onset of injury/illness/exacerbation;Comorbidity 3+    Comorbidities HTN, Fibromyalgia, Hx of RT shoulder RTC repair June 2020    Examination-Activity Limitations Lift;Reach Overhead;Carry;Dressing;Hygiene/Grooming    Examination-Participation Restrictions Cleaning;Meal Prep;Community Activity;Shop    Stability/Clinical Decision Making Evolving/Moderate complexity    Rehab Potential Fair    PT Frequency 2x / week    PT Duration 6 weeks    PT Treatment/Interventions ADLs/Self Care Home Management;Aquatic Therapy;Cryotherapy;Electrical Stimulation;Moist Heat;Traction;DME Instruction;Gait training;Stair training;Functional mobility training;Therapeutic activities;Therapeutic exercise;Balance training;Neuromuscular re-education;Patient/family education;Orthotic Fit/Training;Manual techniques;Passive range of motion;Dry needling;Energy conservation;Taping    PT Next Visit Plan Probable discharge following next 2 visits pending patient presentation. Focus on making patient independent with an advanced HEP.    PT Home Exercise Plan  02/12/20: Cervical retraction 10x;  7/16: chin tucks in supine, seated scap retraction; 02/22/20 - neck retraction, C-spine isometrics - extension, lateral flexion, rotation, chin tuck/head lift; 7/30: cervical and thoracic excursion    Consulted and Agree with Plan of Care Patient           Patient will benefit from skilled therapeutic intervention in order to improve the following deficits and impairments:  Increased fascial restricitons, Pain, Decreased mobility, Decreased activity tolerance, Decreased endurance, Decreased range of motion, Decreased strength, Hypomobility, Impaired UE functional use  Visit Diagnosis: Cervicalgia  Stiffness of right shoulder, not elsewhere classified  Other symptoms and signs involving the musculoskeletal system     Problem List Patient Active Problem List   Diagnosis Date Noted  . Acute pancreatitis 10/17/2019  . ETOH abuse 10/17/2019  . S/P right rotator cuff repair 01/30/19 02/06/2019  . Nontraumatic complete tear of right rotator cuff   . Arthritis of right acromioclavicular joint   . S/P total knee replacement, 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/24/2018  . Shoulder impingement, right 05/24/2018  . Dehydration   . Intractable cyclical vomiting 71/12/2692  . Pyloric stenosis in adult   . Thrombocytopenia ([place]) 12/20/2017  . Leukocytosis   . Malnutrition of moderate degree 12/17/2017  . Acute renal failure (ARF) ([place]) 12/15/2017  . Chronic abdominal pain 12/15/2017  . Gastroenteritis 06/14/2016  . Nausea with vomiting 06/10/2016  . Uterine enlargement 06/09/2016  . Intractable nausea and vomiting 06/08/2016  . Acute infective gastroenteritis 06/08/2016  . Diarrhea 06/08/2016  . Essential hypertension 06/08/2016  . GERD (gastroesophageal reflux disease) 06/08/2016  . Abdominal pain   . Endometrial polyp 04/15/2016  . PMB (postmenopausal bleeding) 02/27/2016  . Alcoholic cirrhosis of liver without ascites ([place])   . Asthma 01/21/2016  . Hepatic cirrhosis ([place]) 09/22/2015  . Arthritis of  knee, degenerative 02/05/2015  . History of Helicobacter pylori infection 10/30/2014  . Chronic [name] with cirrhosis ([place]) 04/11/2014  . De Quervain's disease (radial styloid tenosynovitis) 12/25/2013  . Anorexia 11/21/2012  . FH: colon cancer 11/21/2012  . Early satiety 10/25/2012  . Bowel habit changes 10/25/2012  . Abdominal pain, epigastric 10/25/2012  . Abdominal bloating 10/25/2012  . Constipation 10/25/2012  . Abnormal weight loss 10/25/2012  . Chronic viral [name] ([place]) 10/25/2012  . Radicular pain of left lower extremity 09/28/2012  . Back pain 09/28/2012  . Sciatica 08/10/2011  . S/P arthroscopy of left knee 08/10/2011  . Tibial plateau fracture 08/10/2011  . Pain in joint, lower leg 02/12/2011  . Stiffness of joint, not elsewhere classified, lower leg 02/12/2011  . 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 Meniscus, medial, derangement 12/29/2010  . CLOSED FRACTURE OF UPPER END OF TIBIA 08/12/2010   Clarene Critchley PT, DPT 2:28 PM, 03/12/20 [place], [place], [address] Phone: [phone]   Fax:  [phone]  Name: Colleen Lee MRN: [id number] Date of Birth: 04-07-1956

## 2020-03-17 ENCOUNTER — Other Ambulatory Visit: Payer: Self-pay

## 2020-03-17 ENCOUNTER — Other Ambulatory Visit: Payer: Self-pay | Admitting: Orthopedic Surgery

## 2020-03-17 ENCOUNTER — Ambulatory Visit (HOSPITAL_COMMUNITY): Payer: Medicare Other | Admitting: Physical Therapy

## 2020-03-17 DIAGNOSIS — R29898 Other symptoms and signs involving the musculoskeletal system: Secondary | ICD-10-CM | POA: Diagnosis not present

## 2020-03-17 DIAGNOSIS — G8929 Other chronic pain: Secondary | ICD-10-CM

## 2020-03-17 DIAGNOSIS — M25511 Pain in right shoulder: Secondary | ICD-10-CM | POA: Diagnosis not present

## 2020-03-17 DIAGNOSIS — M25611 Stiffness of right shoulder, not elsewhere classified: Secondary | ICD-10-CM

## 2020-03-17 DIAGNOSIS — M542 Cervicalgia: Secondary | ICD-10-CM | POA: Diagnosis not present

## 2020-03-17 MED ORDER — HYDROCODONE-ACETAMINOPHEN 5-325 MG PO TABS: 1.0000 | ORAL_TABLET | Freq: Four times a day (QID) | ORAL | 0 refills | Status: DC | PRN

## 2020-03-17 NOTE — Telephone Encounter (Signed)
Patient requests refill on Hydrocodone/Acetaminophen 5-325  Mgs.  Qty 21  Sig: Take 1 tablet by mouth every 6 (six) hours as needed for moderate pain.  Patient states she uses [employer]

## 2020-03-17 NOTE — Therapy (Signed)
[place], [place], [address] Phone: [phone]   Fax:  [phone]  Physical Therapy Treatment  Patient Details  Name: Colleen Lee MRN: [id number] Date of Birth: 12-Oct-1955 Referring Provider (PT): Ashok Pall, MD   Encounter Date: 03/17/2020   PT End of Session - 03/17/20 1556    Visit Number 11    Number of Visits 12    Date for PT Re-Evaluation 03/25/20   PN Completed 03/12/20   Authorization Type Primary: [employer] Medicare; Secondary: Medicaid    Progress Note Due on Visit 10    PT Start Time 1443    PT Stop Time 1526    PT Time Calculation (min) 43 min    Activity Tolerance Patient tolerated treatment well;No increased pain    Behavior During Therapy WFL for tasks assessed/performed           Past Medical History:  Diagnosis Date  . Asthma   . Chronic abdominal pain   . Chronic back pain   . Chronic constipation   . Cirrhosis ([place])    Metavir score F4 on elastography 2015  . Cyclical vomiting   . Fibroids   . Fibromyalgia   . GERD (gastroesophageal reflux disease)   . H. pylori infection 2014   treated with pylera, had to be treated again as it was not eradicated. Urea breath test then negative after subsequent treatment.   . [name]    HCV RNA positive 09/2012  . Hypertension   . Marijuana use   . Nausea and vomiting    chronic, recurrent  . PONV (postoperative nausea and vomiting)    pt thinks maybe once she had N&V  . Sciatica of left side     Past Surgical History:  Procedure Laterality Date  . BALLOON DILATION  12/20/2017   Procedure: BALLOON DILATION;  Surgeon: Danie Binder, MD;  [facility];  Service: Endoscopy;;  pyloric dilation  . BIOPSY  12/20/2017   Procedure: BIOPSY;  Surgeon: Danie Binder, MD;  [facility];  Service: Endoscopy;;  duodenal and gastric biopsy  . COLONOSCOPY  01/18/2008   OTL:XBWIOM rectum.  Long redundant colon, a diminutive  sigmoid polyp status post cold biopsy removed. Hyperplastic polyp. Repeat colonoscopy June 2014 due to family history of colon cancer  . COLONOSCOPY WITH ESOPHAGOGASTRODUODENOSCOPY (EGD) N/A 11/02/2012   BTD:HRCBULAG gastric mucosa of doubtful, +H.pylori. Incomplete colonoscopy due to patient unable to tolerate exam, proximal colon seen. Patient refused ACBE.  Marland Kitchen COLONOSCOPY WITH PROPOFOL N/A 03/08/2013   Dr. Gala Romney: colonic polyp-removed as scribed above. Internal Hemorrhoids. Pathology did not reveal any colonic tissue, only mucus. SURVEILLANCE DUE Aug 2019  . COLONOSCOPY WITH PROPOFOL N/A 01/19/2018   Dr. Gala Romney: 6 mm cecal inflammatoy polyp, otherwise normal., Surveilance in 5 year s  . ESOPHAGEAL DILATION  12/20/2017   Procedure: ESOPHAGEAL DILATION;  Surgeon: Danie Binder, MD;  [facility];  Service: Endoscopy;;  . ESOPHAGOGASTRODUODENOSCOPY  01/18/2008   RMR: Normal esophagus, normal  stomach  . ESOPHAGOGASTRODUODENOSCOPY (EGD) WITH PROPOFOL N/A 02/09/2016   Normal esophagus, small hiatal hernia, portal hypertensive gastropathy, normal second portion of duodenum. Due in July 2019  . ESOPHAGOGASTRODUODENOSCOPY (EGD) WITH PROPOFOL N/A 12/20/2017   Dr. Oneida Alar: benign appearing esophageal stenosis s/p dilation, mild pyloric stenosis s/p biopsy and dilation, mild duodenitis.   Marland Kitchen HYSTEROSCOPY  05/11/2016   Procedure: HYSTEROSCOPY;  Surgeon: Jonnie Kind, MD;  Location: [facility];  Service: Gynecology;;  . KNEE SURGERY     left knee  . MULTIPLE EXTRACTIONS WITH ALVEOLOPLASTY N/A 10/15/2013   Procedure: MULTIPLE EXTRACTION WITH ALVEOLOPLASTY;  Surgeon: Gae Bon, DDS;  Location: [facility];  Service: Oral Surgery;  Laterality: N/A;  . POLYPECTOMY N/A 03/08/2013   Procedure: POLYPECTOMY;  Surgeon: Daneil Dolin, MD;  Location: [facility];  Service: Endoscopy;  Laterality: N/A;  . POLYPECTOMY N/A 05/11/2016   Procedure: REMOVAL OF ENDOMETRIAL POLYP;  Surgeon: Jonnie Kind, MD;  Location: [facility];  Service: Gynecology;  Laterality: N/A;  . RESECTION DISTAL CLAVICAL Right 01/30/2019   Procedure: RESECTION DISTAL CLAVICAL;  Surgeon: Carole Civil, MD;  Location: [facility];  Service: Orthopedics;  Laterality: Right;  . SHOULDER OPEN ROTATOR CUFF REPAIR Right 01/30/2019   Procedure: ROTATOR CUFF REPAIR SHOULDER OPEN;  Surgeon: Carole Civil, MD;  Location: [facility];  Service: Orthopedics;  Laterality: Right;  pt to arrive at 7:30 for PICC at 8:00  . TOTAL KNEE ARTHROPLASTY Left 02/05/2015   Procedure: LEFT TOTAL KNEE ARTHROPLASTY;  Surgeon: Carole Civil, MD;  Location: [facility];  Service: Orthopedics;  Laterality: Left;    There were no vitals filed for this visit.   Subjective Assessment - 03/17/20 1446    Subjective pt states she can tell the rain is coming because her pain is up.  states it's been a rough day today.  Pain up to 6/10.    Currently in Pain? Yes    Pain Score 6     Pain Location Neck    Pain Orientation Right    Pain Descriptors / Indicators Aching;Tender;Throbbing;Tightness                             [facility] Adult PT Treatment/Exercise - 03/17/20 0001      Neck Exercises: Machines for Strengthening   UBE (Upper Arm Bike) 3 minutes backward level 1      Neck Exercises: Theraband   Shoulder Extension 10 reps;Green    Shoulder Extension Limitations 2 sets    Rows 10 reps;Green    Rows Limitations 2 sets       Neck Exercises: Standing   Neck Retraction 20 reps;5 secs      Neck Exercises: Seated   Neck Retraction 10 reps    Cervical Rotation Both;10 reps    Lateral Flexion 10 reps;Both    W Back 15 reps    Shoulder Rolls Backwards;10 reps      Manual Therapy   Manual Therapy Soft tissue mobilization    Manual therapy comments completed seperately from rest of treatment interventions    Soft tissue mobilization seated instrumented assist STM with theragun to bilateral paraspinals and UT focus on right and right UE.                      PT Short Term Goals - 03/06/20 1315      PT SHORT TERM GOAL #1   Title Patient will report understanding and regular compliance with HEP to improve mobility, improve stability, and decrease pain.    Time 3    Period Weeks    Status Achieved    Target Date 03/04/20      PT SHORT TERM GOAL #2   Title Patient will report improvement in overall subjective complaint of at least 25% for improved QoL.    Time 3    Period Weeks  Status Achieved    Target Date 03/04/20             PT Long Term Goals - 03/12/20 1351      PT LONG TERM GOAL #1   Title Patient will demonstrate WFL AROM of RT shoulder to improve functional mobility and decrease comensatory movements affecting the cervical spine.    Baseline 03/12/20: See objective measures    Time 6    Period Weeks    Status On-going      PT LONG TERM GOAL #2   Title Patient will demonstrate cervical AROM WFL in order to observe the environment with improved ease and decreased pain.    Baseline 03/12/20: See objective measures    Time 6    Period Weeks    Status On-going      PT LONG TERM GOAL #3   Title Patient will demonstrate improvement on FOTO of at least 10% indicating improved overall functional mobility.    Baseline 03/12/20: See objective measures    Time 6    Period Weeks    Status On-going      PT LONG TERM GOAL #4   Title Patient will report improvement in overall subjective complaint of at least 50% for improved QoL.    Baseline 03/12/20: Patient reports a 50% improvment since beginning therapy    Time 6    Period Weeks    Status Achieved                 Plan - 03/17/20 1554    Personal Factors and Comorbidities Age;Time since onset of injury/illness/exacerbation;Comorbidity 3+    Comorbidities HTN, Fibromyalgia, Hx of RT shoulder RTC repair June 2020    Examination-Activity Limitations Lift;Reach Overhead;Carry;Dressing;Hygiene/Grooming    Examination-Participation Restrictions  Cleaning;Meal Prep;Community Activity;Shop    Stability/Clinical Decision Making Evolving/Moderate complexity    Rehab Potential Fair    PT Frequency 2x / week    PT Duration 6 weeks    PT Treatment/Interventions ADLs/Self Care Home Management;Aquatic Therapy;Cryotherapy;Electrical Stimulation;Moist Heat;Traction;DME Instruction;Gait training;Stair training;Functional mobility training;Therapeutic activities;Therapeutic exercise;Balance training;Neuromuscular re-education;Patient/family education;Orthotic Fit/Training;Manual techniques;Passive range of motion;Dry needling;Energy conservation;Taping    PT Next Visit Plan Probable discharge following next visits pending patient presentation. Focus on making patient independent with an advanced HEP.    PT Home Exercise Plan 02/12/20: Cervical retraction 10x; 7/16: chin tucks in supine, seated scap retraction; 02/22/20 - neck retraction, C-spine isometrics - extension, lateral flexion, rotation, chin tuck/head lift; 7/30: cervical and thoracic excursion    Consulted and Agree with Plan of Care Patient           Patient will benefit from skilled therapeutic intervention in order to improve the following deficits and impairments:  Increased fascial restricitons, Pain, Decreased mobility, Decreased activity tolerance, Decreased endurance, Decreased range of motion, Decreased strength, Hypomobility, Impaired UE functional use  Visit Diagnosis: Cervicalgia  Other symptoms and signs involving the musculoskeletal system  Acute pain of right shoulder  Stiffness of right shoulder, not elsewhere classified  Chronic right shoulder pain     Problem List Patient Active Problem List   Diagnosis Date Noted  . Acute pancreatitis 10/17/2019  . ETOH abuse 10/17/2019  . S/P right rotator cuff repair 01/30/19 02/06/2019  . Nontraumatic complete tear of right rotator cuff   . Arthritis of right acromioclavicular joint   . S/P total knee replacement, left  02/05/15 05/24/2018  . Shoulder impingement, right 05/24/2018  . Dehydration   . Intractable cyclical vomiting 09/47/0962  . Pyloric  stenosis in adult   . Thrombocytopenia ([place]) 12/20/2017  . Leukocytosis   . Malnutrition of moderate degree 12/17/2017  . Acute renal failure (ARF) ([place]) 12/15/2017  . Chronic abdominal pain 12/15/2017  . Gastroenteritis 06/14/2016  . Nausea with vomiting 06/10/2016  . Uterine enlargement 06/09/2016  . Intractable nausea and vomiting 06/08/2016  . Acute infective gastroenteritis 06/08/2016  . Diarrhea 06/08/2016  . Essential hypertension 06/08/2016  . GERD (gastroesophageal reflux disease) 06/08/2016  . Abdominal pain   . Endometrial polyp 04/15/2016  . PMB (postmenopausal bleeding) 02/27/2016  . Alcoholic cirrhosis of liver without ascites ([place])   . Asthma 01/21/2016  . Hepatic cirrhosis ([place]) 09/22/2015  . Arthritis of knee, degenerative 02/05/2015  . History of Helicobacter pylori infection 10/30/2014  . Chronic [name] with cirrhosis ([place]) 04/11/2014  . De Quervain's disease (radial styloid tenosynovitis) 12/25/2013  . Anorexia 11/21/2012  . FH: colon cancer 11/21/2012  . Early satiety 10/25/2012  . Bowel habit changes 10/25/2012  . Abdominal pain, epigastric 10/25/2012  . Abdominal bloating 10/25/2012  . Constipation 10/25/2012  . Abnormal weight loss 10/25/2012  . Chronic viral [name] ([place]) 10/25/2012  . Radicular pain of left lower extremity 09/28/2012  . Back pain 09/28/2012  . Sciatica 08/10/2011  . S/P arthroscopy of left knee 08/10/2011  . Tibial plateau fracture 08/10/2011  . Pain in joint, lower leg 02/12/2011  . Stiffness of joint, not elsewhere classified, lower leg 02/12/2011  . 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 Meniscus, medial, derangement 12/29/2010  . CLOSED FRACTURE OF UPPER END OF TIBIA 08/12/2010   Teena Irani, PTA/CLT [phone]  Teena Irani 03/17/2020, 3:56 PM  [place], [place], [address] Phone: [phone]   Fax:  [phone]  Name: CHERYLYN SUNDBY MRN: [id number] Date of Birth: Sep 14, 1955

## 2020-03-19 ENCOUNTER — Telehealth (HOSPITAL_COMMUNITY): Payer: Self-pay

## 2020-03-19 ENCOUNTER — Ambulatory Visit (HOSPITAL_COMMUNITY): Payer: Medicare Other

## 2020-03-19 NOTE — Telephone Encounter (Signed)
pt cancelled appt for today because she has a bad headache

## 2020-03-24 ENCOUNTER — Ambulatory Visit (HOSPITAL_COMMUNITY): Payer: Medicare Other

## 2020-03-24 ENCOUNTER — Encounter (HOSPITAL_COMMUNITY): Payer: Self-pay

## 2020-03-24 ENCOUNTER — Other Ambulatory Visit: Payer: Self-pay

## 2020-03-24 DIAGNOSIS — G8929 Other chronic pain: Secondary | ICD-10-CM | POA: Diagnosis not present

## 2020-03-24 DIAGNOSIS — M542 Cervicalgia: Secondary | ICD-10-CM

## 2020-03-24 DIAGNOSIS — M25511 Pain in right shoulder: Secondary | ICD-10-CM | POA: Diagnosis not present

## 2020-03-24 DIAGNOSIS — R29898 Other symptoms and signs involving the musculoskeletal system: Secondary | ICD-10-CM

## 2020-03-24 DIAGNOSIS — M25611 Stiffness of right shoulder, not elsewhere classified: Secondary | ICD-10-CM | POA: Diagnosis not present

## 2020-03-24 MED ORDER — HYDROCODONE-ACETAMINOPHEN 5-325 MG PO TABS: 1.0000 | ORAL_TABLET | Freq: Four times a day (QID) | ORAL | 0 refills | Status: DC | PRN

## 2020-03-24 NOTE — Therapy (Signed)
[facility] [address] [place], [place], [address] Phone: [phone]   Fax:  [phone]  PHYSICAL THERAPY DISCHARGE SUMMARY  Visits from Start of Care: 12  Current functional level related to goals / functional outcomes: See below   Remaining deficits: See below   Education / Equipment: HEP Plan: Patient agrees to discharge.  Patient goals were partially met. Patient is being discharged due to lack of progress.  ?????       Physical Therapy Treatment  Patient Details  Name: Colleen Lee MRN: [id number] Date of Birth: [birth year] Referring Provider (PT): Ashok Pall, MD   Encounter Date: 03/24/2020   PT End of Session - 03/24/20 1305    Visit Number 12    Number of Visits 12    Date for PT Re-Evaluation 03/25/20   PN Completed 03/12/20   Authorization Type Primary: [employer] Medicare; Secondary: Medicaid    Progress Note Due on Visit 20    PT Start Time 1303    PT Stop Time 1335    PT Time Calculation (min) 32 min    Activity Tolerance Patient limited by pain    Behavior During Therapy [facility] for tasks assessed/performed           Past Medical History:  Diagnosis Date  . Asthma   . Chronic abdominal pain   . Chronic back pain   . Chronic constipation   . Cirrhosis ([place])    Metavir score F4 on elastography 2015  . Cyclical vomiting   . Fibroids   . Fibromyalgia   . GERD (gastroesophageal reflux disease)   . H. pylori infection 2014   treated with pylera, had to be treated again as it was not eradicated. Urea breath test then negative after subsequent treatment.   . [name]    HCV RNA positive 09/2012  . Hypertension   . Marijuana use   . Nausea and vomiting    chronic, recurrent  . PONV (postoperative nausea and vomiting)    pt thinks maybe once she had N&V  . Sciatica of left side     Past Surgical History:  Procedure Laterality Date  . BALLOON DILATION  12/20/2017   Procedure: BALLOON DILATION;   Surgeon: Danie Binder, MD;  [facility];  Service: Endoscopy;;  pyloric dilation  . BIOPSY  12/20/2017   Procedure: BIOPSY;  Surgeon: Danie Binder, MD;  [facility];  Service: Endoscopy;;  duodenal and gastric biopsy  . COLONOSCOPY  01/18/2008   VEL:FYBOFB rectum.  Long redundant colon, a diminutive sigmoid polyp status post cold biopsy removed. Hyperplastic polyp. Repeat colonoscopy June 2014 due to family history of colon cancer  . COLONOSCOPY WITH ESOPHAGOGASTRODUODENOSCOPY (EGD) N/A 11/02/2012   PZW:CHENIDPO gastric mucosa of doubtful, +H.pylori. Incomplete colonoscopy due to patient unable to tolerate exam, proximal colon seen. Patient refused ACBE.  Marland Kitchen COLONOSCOPY WITH PROPOFOL N/A 03/08/2013   Dr. Gala Romney: colonic polyp-removed as scribed above. Internal Hemorrhoids. Pathology did not reveal any colonic tissue, only mucus. SURVEILLANCE DUE Aug 2019  . COLONOSCOPY WITH PROPOFOL N/A 01/19/2018   Dr. Gala Romney: 6 mm cecal inflammatoy polyp, otherwise normal., Surveilance in 5 year s  . ESOPHAGEAL DILATION  12/20/2017   Procedure: ESOPHAGEAL DILATION;  Surgeon: Danie Binder, MD;  [facility];  Service: Endoscopy;;  . ESOPHAGOGASTRODUODENOSCOPY  01/18/2008   RMR: Normal esophagus, normal  stomach  . ESOPHAGOGASTRODUODENOSCOPY (EGD) WITH PROPOFOL N/A 02/09/2016   Normal esophagus, small  hiatal hernia, portal hypertensive gastropathy, normal second portion of duodenum. Due in July 2019  . ESOPHAGOGASTRODUODENOSCOPY (EGD) WITH PROPOFOL N/A 12/20/2017   Dr. Oneida Alar: benign appearing esophageal stenosis s/p dilation, mild pyloric stenosis s/p biopsy and dilation, mild duodenitis.   [facility] Kitchen HYSTEROSCOPY  05/11/2016   Procedure: HYSTEROSCOPY;  Surgeon: Jonnie Kind, MD;  Location: [facility];  Service: Gynecology;;  . KNEE SURGERY     left knee  . MULTIPLE EXTRACTIONS WITH ALVEOLOPLASTY N/A 10/15/2013   Procedure: MULTIPLE EXTRACTION WITH ALVEOLOPLASTY;  Surgeon: Gae Bon, DDS;  Location: [facility];  Service: Oral Surgery;  Laterality: N/A;  . POLYPECTOMY N/A 03/08/2013   Procedure: POLYPECTOMY;  Surgeon: Daneil Dolin, MD;  Location: [facility];  Service: Endoscopy;  Laterality: N/A;  . POLYPECTOMY N/A 05/11/2016   Procedure: REMOVAL OF ENDOMETRIAL POLYP;  Surgeon: Jonnie Kind, MD;  Location: [facility];  Service: Gynecology;  Laterality: N/A;  . RESECTION DISTAL CLAVICAL Right 01/30/2019   Procedure: RESECTION DISTAL CLAVICAL;  Surgeon: Carole Civil, MD;  Location: [facility];  Service: Orthopedics;  Laterality: Right;  . SHOULDER OPEN ROTATOR CUFF REPAIR Right 01/30/2019   Procedure: ROTATOR CUFF REPAIR SHOULDER OPEN;  Surgeon: Carole Civil, MD;  Location: [facility];  Service: Orthopedics;  Laterality: Right;  pt to arrive at 7:30 for PICC at 8:00  . TOTAL KNEE ARTHROPLASTY Left 02/05/2015   Procedure: LEFT TOTAL KNEE ARTHROPLASTY;  Surgeon: Carole Civil, MD;  Location: [facility];  Service: Orthopedics;  Laterality: Left;    There were no vitals filed for this visit.   Subjective Assessment - 03/24/20 1306    Subjective Pt reports things are getting better, she still has bad days and good days. Pt reports sharp needle like pain in both arms that then runs down to her hands and then goes away randomly.    Pertinent History History of Right RTC repair 01/2019.    Limitations House hold activities;Lifting    Diagnostic tests MRI of cervical spine    Patient Stated Goals To have less neck pain    Currently in Pain? 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    Referring Provider (PT) Ashok Pall, MD      Precautions   Precautions None      Restrictions   Weight Bearing Restrictions No      Cognition   Overall Cognitive Status Within Functional Limits for tasks assessed      Observation/Other Assessments   Focus on Therapeutic  Outcomes (FOTO)  conflicting answers- 43% limited final answers   was 29.8%     Posture/Postural Control   Posture/Postural Control Postural limitations    Postural Limitations Forward head;Rounded Shoulders;Anterior pelvic tilt;Flexed trunk    Posture Comments abdomen protruding forward significantly, head ~5 deg R tilt in resting      AROM   Overall AROM Comments RT shoulder flex 148 deg AROM, RT shld abduction 130 deg AROM; LT shoulder flexion/abduction WFL   was R flex 135deg, R abd 115deg   Cervical Flexion 30 - "no pain"   was 25 with "feels alright"   Cervical Extension 22 - "more than a little painful"   was 35 with "a little pain"   Cervical - Right Side Bend 30 - "a little pain"   was 20 painful  Cervical - Left Side Bend 22 - "10/10"   was 30 no pain   Cervical - Right Rotation 34 - "6/10"   was 60 no pain   Cervical - Left Rotation 50 - "no pain"   was 60 no pain              PT Education - 03/24/20 1306    Education Details Educated pt on discharge from therapy to HEP and return to referring MD for further assessment.    Person(s) Educated Patient    Methods Explanation    Comprehension Verbalized understanding            PT Short Term Goals - 03/24/20 1307      PT SHORT TERM GOAL #1   Title Patient will report understanding and regular compliance with HEP to improve mobility, improve stability, and decrease pain.    Baseline 8/23: pt reports performing daily    Time 3    Period Weeks    Status Achieved    Target Date 03/04/20      PT SHORT TERM GOAL #2   Title Patient will report improvement in overall subjective complaint of at least 25% for improved QoL.    Baseline 8/23: pt reports 40% improvement since starting thearpy    Time 3    Period Weeks    Status Achieved    Target Date 03/04/20             PT Long Term Goals - 03/24/20 1307      PT LONG TERM GOAL #1   Title Patient will demonstrate WFL AROM of RT shoulder to improve functional  mobility and decrease comensatory movements affecting the cervical spine.    Baseline 03/24/20: See objective measures    Time 6    Period Weeks    Status Partially Met      PT LONG TERM GOAL #2   Title Patient will demonstrate cervical AROM WFL in order to observe the environment with improved ease and decreased pain.    Baseline 03/24/20: See objective measures    Time 6    Period Weeks    Status Not Met      PT LONG TERM GOAL #3   Title Patient will demonstrate improvement on FOTO of at least 10% indicating improved overall functional mobility.    Baseline 03/24/20: completed twice due to confusion regarding rating scale (58% limited)    Time 6    Period Weeks    Status Achieved      PT LONG TERM GOAL #4   Title Patient will report improvement in overall subjective complaint of at least 50% for improved QoL.    Baseline 03/12/20: Patient reports a 50% improvment since beginning therapy; 8/23: pt reports 40% improvement since starting therapy    Time 6    Period Weeks    Status Partially Met                 Plan - 03/24/20 1345    Clinical Impression Statement Pt appears to have difficulty determining improvement in pain/symptoms since starting therapy, at beginning of session states "40% improvement" and later states "a little difficulty" during FOTO assessment but then corrects self at end of test stating she can't do any of the activities asked about. Pt demonstrates continued decrease in cervical AROM with significant decrease from 2 weeks prior, facial wincing noted. Pt with conflicting responses reporting pain while AROM testing, but denies any pain during session. Pt  educated on therapeutic process and time to build strength. Pt possibly demonstrating with greater deficits today due to pain, but reports today is a good day. Pt would benefit from return to referring provider for further assessment due to subjective and objective plateau of improvements and somewhat worsening  in symptoms. Will d/c pt to HEP at this time and educated pt on return to MD.    Personal Factors and Comorbidities Age;Time since onset of injury/illness/exacerbation;Comorbidity 3+    Comorbidities HTN, Fibromyalgia, Hx of RT shoulder RTC repair June 2020    Examination-Activity Limitations Lift;Reach Overhead;Carry;Dressing;Hygiene/Grooming    Examination-Participation Restrictions Cleaning;Meal Prep;Community Activity;Shop    Stability/Clinical Decision Making Evolving/Moderate complexity    Rehab Potential Fair    PT Frequency 2x / week    PT Duration 6 weeks    PT Treatment/Interventions ADLs/Self Care Home Management;Aquatic Therapy;Cryotherapy;Electrical Stimulation;Moist Heat;Traction;DME Instruction;Gait training;Stair training;Functional mobility training;Therapeutic activities;Therapeutic exercise;Balance training;Neuromuscular re-education;Patient/family education;Orthotic Fit/Training;Manual techniques;Passive range of motion;Dry needling;Energy conservation;Taping    PT Next Visit Plan d/c to HEP and f/u wiht referring MD due to plateau of progress    PT Home Exercise Plan 02/12/20: Cervical retraction 10x; 7/16: chin tucks in supine, seated scap retraction; 02/22/20 - neck retraction, C-spine isometrics - extension, lateral flexion, rotation, chin tuck/head lift; 7/30: cervical and thoracic excursion    Consulted and Agree with Plan of Care Patient           Patient will benefit from skilled therapeutic intervention in order to improve the following deficits and impairments:  Increased fascial restricitons, Pain, Decreased mobility, Decreased activity tolerance, Decreased endurance, Decreased range of motion, Decreased strength, Hypomobility, Impaired UE functional use  Visit Diagnosis: Cervicalgia  Other symptoms and signs involving the musculoskeletal system     Problem List Patient Active Problem List   Diagnosis Date Noted  . Acute pancreatitis 10/17/2019  . ETOH  abuse 10/17/2019  . S/P right rotator cuff repair 01/30/19 02/06/2019  . Nontraumatic complete tear of right rotator cuff   . Arthritis of right acromioclavicular joint   . S/P total knee replacement, 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/24/2018  . Shoulder impingement, right 05/24/2018  . Dehydration   . Intractable cyclical vomiting 16/05/9603  . Pyloric stenosis in adult   . Thrombocytopenia ([place]) 12/20/2017  . Leukocytosis   . Malnutrition of moderate degree 12/17/2017  . Acute renal failure (ARF) ([place]) 12/15/2017  . Chronic abdominal pain 12/15/2017  . Gastroenteritis 06/14/2016  . Nausea with vomiting 06/10/2016  . Uterine enlargement 06/09/2016  . Intractable nausea and vomiting 06/08/2016  . Acute infective gastroenteritis 06/08/2016  . Diarrhea 06/08/2016  . Essential hypertension 06/08/2016  . GERD (gastroesophageal reflux disease) 06/08/2016  . Abdominal pain   . Endometrial polyp 04/15/2016  . PMB (postmenopausal bleeding) 02/27/2016  . Alcoholic cirrhosis of liver without ascites ([place])   . Asthma 01/21/2016  . Hepatic cirrhosis ([place]) 09/22/2015  . Arthritis of knee, degenerative 02/05/2015  . History of Helicobacter pylori infection 10/30/2014  . Chronic [name] with cirrhosis ([place]) 04/11/2014  . De Quervain's disease (radial styloid tenosynovitis) 12/25/2013  . Anorexia 11/21/2012  . FH: colon cancer 11/21/2012  . Early satiety 10/25/2012  . Bowel habit changes 10/25/2012  . Abdominal pain, epigastric 10/25/2012  . Abdominal bloating 10/25/2012  . Constipation 10/25/2012  . Abnormal weight loss 10/25/2012  . Chronic viral [name] ([place]) 10/25/2012  . Radicular pain of left lower extremity 09/28/2012  . Back pain 09/28/2012  . Sciatica 08/10/2011  . S/P arthroscopy of left knee  08/10/2011  . Tibial plateau fracture 08/10/2011  . Pain in joint, lower leg 02/12/2011  . Stiffness of joint, not elsewhere classified, lower leg 02/12/2011  . 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 Meniscus, medial, derangement 12/29/2010  . CLOSED FRACTURE OF UPPER END OF TIBIA 08/12/2010    Talbot Grumbling PT, DPT 03/24/20, 4:00 PM [facility] [address] [place], [place], [address] Phone: [phone]   Fax:  [phone]  Name: Colleen Lee MRN: [id number] Date of Birth: Jun 22, 1956

## 2020-03-24 NOTE — Telephone Encounter (Signed)
Hydrocodone-Acetaminophen 5/325 mg  Qty 21 Tablets  Take 1 tablet by mouth every six (6) hours as needed for moderate pain.  PATIENT USES [facility]

## 2020-03-26 ENCOUNTER — Encounter (HOSPITAL_COMMUNITY): Payer: Medicare Other

## 2020-03-29 DIAGNOSIS — I10 Essential (primary) hypertension: Secondary | ICD-10-CM | POA: Diagnosis not present

## 2020-03-29 DIAGNOSIS — J449 Chronic obstructive pulmonary disease, unspecified: Secondary | ICD-10-CM | POA: Diagnosis not present

## 2020-03-31 ENCOUNTER — Other Ambulatory Visit: Payer: Self-pay | Admitting: Orthopedic Surgery

## 2020-03-31 MED ORDER — HYDROCODONE-ACETAMINOPHEN 5-325 MG PO TABS: 1.0000 | ORAL_TABLET | Freq: Four times a day (QID) | ORAL | 0 refills | Status: DC | PRN

## 2020-03-31 NOTE — Telephone Encounter (Signed)
Patient requests refill on Hydrocodone/Acetaminophen 5-325 mgs.  Qty 21  Sig: Take 1 tablet by mouth every 6 (six) hours as needed for moderate pain.  Patient states she uses [facility] 

## 2020-04-08 ENCOUNTER — Other Ambulatory Visit: Payer: Self-pay | Admitting: Orthopedic Surgery

## 2020-04-08 NOTE — Telephone Encounter (Signed)
Patient requests refill on Hydrocodone/Acetaminophen 5-325  Mgs.  Qty  21  Sig: Take 1 tablet by mouth every 6 (six) hours as needed for moderate pain.  Patient states she uses [facility]

## 2020-04-09 MED ORDER — HYDROCODONE-ACETAMINOPHEN 5-325 MG PO TABS: 1.0000 | ORAL_TABLET | Freq: Four times a day (QID) | ORAL | 0 refills | Status: DC | PRN

## 2020-04-14 ENCOUNTER — Other Ambulatory Visit: Payer: Self-pay | Admitting: Orthopedic Surgery

## 2020-04-14 ENCOUNTER — Ambulatory Visit (HOSPITAL_COMMUNITY)
Admission: RE | Admit: 2020-04-14 | Discharge: 2020-04-14 | Disposition: A | Discharge status: Home / Self Care | Payer: Medicare Other | Source: Ambulatory Visit | Attending: Internal Medicine | Admitting: Internal Medicine

## 2020-04-14 ENCOUNTER — Other Ambulatory Visit: Payer: Self-pay

## 2020-04-14 DIAGNOSIS — Z1231 Encounter for screening mammogram for malignant neoplasm of breast: Secondary | ICD-10-CM | POA: Insufficient documentation

## 2020-04-14 MED ORDER — HYDROCODONE-ACETAMINOPHEN 5-325 MG PO TABS: 1.0000 | ORAL_TABLET | Freq: Four times a day (QID) | ORAL | 0 refills | Status: DC | PRN

## 2020-04-14 NOTE — Telephone Encounter (Signed)
Patient called to request refill:  HYDROcodone-acetaminophen (NORCO/VICODIN) 5-325 MG tablet 21 tab  -[facility]

## 2020-04-21 ENCOUNTER — Other Ambulatory Visit: Payer: Self-pay | Admitting: Orthopedic Surgery

## 2020-04-21 MED ORDER — HYDROCODONE-ACETAMINOPHEN 5-325 MG PO TABS: 1.0000 | ORAL_TABLET | Freq: Four times a day (QID) | ORAL | 0 refills | Status: DC | PRN

## 2020-04-21 NOTE — Telephone Encounter (Signed)
Patient requests refill:  HYDROcodone-acetaminophen (NORCO/VICODIN) 5-325 MG tablet 21 tablet  -[facility]

## 2020-04-28 ENCOUNTER — Other Ambulatory Visit: Payer: Self-pay | Admitting: Orthopedic Surgery

## 2020-04-28 MED ORDER — HYDROCODONE-ACETAMINOPHEN 5-325 MG PO TABS: 1.0000 | ORAL_TABLET | Freq: Four times a day (QID) | ORAL | 0 refills | Status: DC | PRN

## 2020-04-28 NOTE — Telephone Encounter (Signed)
Call from patient for refill: HYDROcodone-acetaminophen (NORCO/VICODIN) 5-325 MG tablet 21 tablet  -[facility]

## 2020-04-29 DIAGNOSIS — I10 Essential (primary) hypertension: Secondary | ICD-10-CM | POA: Diagnosis not present

## 2020-04-29 DIAGNOSIS — K219 Gastro-esophageal reflux disease without esophagitis: Secondary | ICD-10-CM | POA: Diagnosis not present

## 2020-05-05 ENCOUNTER — Other Ambulatory Visit: Payer: Self-pay | Admitting: Orthopedic Surgery

## 2020-05-05 MED ORDER — HYDROCODONE-ACETAMINOPHEN 5-325 MG PO TABS: 1.0000 | ORAL_TABLET | Freq: Four times a day (QID) | ORAL | 0 refills | Status: DC | PRN

## 2020-05-05 NOTE — Telephone Encounter (Signed)
Patient requests refill on Hydrocodone/Acetaminophen 5-325  Mgs.  Qty  21  Sig: Take 1 tablet by mouth every 6 (six) hours as needed for moderate pain.  Patient states she uses [facility]

## 2020-05-12 ENCOUNTER — Other Ambulatory Visit: Payer: Self-pay | Admitting: Orthopedic Surgery

## 2020-05-12 MED ORDER — HYDROCODONE-ACETAMINOPHEN 5-325 MG PO TABS: 1.0000 | ORAL_TABLET | Freq: Four times a day (QID) | ORAL | 0 refills | Status: DC | PRN

## 2020-05-12 NOTE — Telephone Encounter (Signed)
Patient requests refill on Hydrocodone/Acetaminophen 5-325  Mgs.  Qty  21  Sig: Take 1 tablet by mouth every 6 (six) hours as needed for moderate pain.  Patient states she uses [facility]

## 2020-05-13 ENCOUNTER — Ambulatory Visit (INDEPENDENT_AMBULATORY_CARE_PROVIDER_SITE_OTHER): Payer: Medicare Other | Admitting: Gastroenterology

## 2020-05-13 ENCOUNTER — Telehealth: Payer: Self-pay | Admitting: *Deleted

## 2020-05-13 ENCOUNTER — Encounter: Payer: Self-pay | Admitting: *Deleted

## 2020-05-13 ENCOUNTER — Encounter: Payer: Self-pay | Admitting: Gastroenterology

## 2020-05-13 ENCOUNTER — Other Ambulatory Visit: Payer: Self-pay

## 2020-05-13 VITALS — BP 111/67 | HR 92 | Temp 97.0°F | Ht 66.0 in | Wt 193.6 lb

## 2020-05-13 DIAGNOSIS — K858 Other acute pancreatitis without necrosis or infection: Secondary | ICD-10-CM | POA: Diagnosis not present

## 2020-05-13 DIAGNOSIS — R194 Change in bowel habit: Secondary | ICD-10-CM

## 2020-05-13 DIAGNOSIS — K7460 Unspecified cirrhosis of liver: Secondary | ICD-10-CM | POA: Diagnosis not present

## 2020-05-13 DIAGNOSIS — K219 Gastro-esophageal reflux disease without esophagitis: Secondary | ICD-10-CM | POA: Diagnosis not present

## 2020-05-13 NOTE — Patient Instructions (Signed)
Please complete blood work. We have also ordered a CT scan of your pancreas.  We are arranging an upper endoscopy for routine screening purposes.   Please stop Trulance for now. Call in a few days with how you are doing regarding your bowel habits.   We will see you in 6 months!  I enjoyed seeing you again today! As you know, I value our relationship and want to provide genuine, compassionate, and quality care. I welcome your feedback. If you receive a survey regarding your visit,  I greatly appreciate you taking time to fill this out. See you next time!  Annitta Needs, PhD, ANP-BC [facility]

## 2020-05-13 NOTE — Progress Notes (Signed)
Referring Provider: Rosita Fire, MD Primary Care Physician:  Rosita Fire, MD Primary GI: Dr. Gala Romney  Chief Complaint  Patient presents with  . Cirrhosis    f/u  . Constipation    reports BM's are runny now, green in color    HPI:   Colleen Lee is a 64 y.o. 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 Evaluated by Vascular previously with recommendations to return as needed  Reviewing ultrasounds, she had nodular contours last noted in 2018 but imaging thereafter with coarse liver, spleen appeared normal in March 2020. Thrombocytopenia from 2 years ago resolved. On last EGD in 2019, she did not have any stigmata of portal hypertension. Previously portal gastropathy noted in 2017. US abdomen on file from Feb 2021.   Prior episode of acute uncomplicated pancreatitis in March 2021 felt related to alcohol. Unable to rule out biliary. Still needs CT with pancreatic protocol.    Still taking Trulance. Notes she wakes around 3-4 am with looser stool. Sometimes regular formed. 4-5 looser stool this morning. Not every day. States she eats around 9pm. Increased the amount of greens in her diet. Eating a lot of greens at night. Trying to bake food. Occasionally frying.   GERD: Protonix once daily. No dysphagia.   Feeling tired lately.       Past Medical History:  Diagnosis Date  . Asthma   . Chronic abdominal pain   . Chronic back pain   . Chronic constipation   . Cirrhosis  ([place])    Metavir score F4 on elastography 2015  . Cyclical vomiting   . Fibroids   . Fibromyalgia   . GERD (gastroesophageal reflux disease)   . H. pylori infection 2014   treated with pylera, had to be treated again as it was not eradicated. Urea breath test then negative after subsequent treatment.   . [name]    HCV RNA positive 09/2012  . Hypertension   . Marijuana use   . Nausea and vomiting    chronic, recurrent  . PONV (postoperative nausea and vomiting)    pt thinks maybe once she had N&V  . Sciatica of left side     Past Surgical History:  Procedure Laterality Date  . BALLOON DILATION  12/20/2017   Procedure: BALLOON DILATION;  Surgeon: Danie Binder, MD;  [facility];  Service: Endoscopy;;  pyloric dilation  . BIOPSY  12/20/2017   Procedure: BIOPSY;  Surgeon: Danie Binder, MD;  Location: [facility];  Service: Endoscopy;;  duodenal and gastric biopsy  . COLONOSCOPY  01/18/2008   PPJ:KDTOIZ rectum.  Long redundant colon, a diminutive sigmoid polyp status post cold biopsy removed. Hyperplastic polyp. Repeat colonoscopy June 2014 due to family history of colon cancer  . COLONOSCOPY WITH ESOPHAGOGASTRODUODENOSCOPY (EGD) N/A 11/02/2012   TIW:[id number] gastric mucosa of doubtful, +H.pylori. Incomplete colonoscopy due to patient unable to tolerate exam, proximal colon seen. Patient refused ACBE.  Marland Kitchen COLONOSCOPY WITH PROPOFOL N/A 03/08/2013   Dr. Gala Romney: colonic polyp-removed as scribed above. Internal Hemorrhoids. Pathology did not reveal any colonic tissue, only mucus.  SURVEILLANCE DUE Aug 2019  . COLONOSCOPY WITH PROPOFOL N/A 01/19/2018   Dr. Jena Gauss: 6 mm cecal inflammatoy polyp, otherwise normal., Surveilance in 5 year s  . ESOPHAGEAL DILATION  12/20/2017   Procedure: ESOPHAGEAL DILATION;  Surgeon: West Bali, MD;  [facility]: [facility];  Service: Endoscopy;;  . ESOPHAGOGASTRODUODENOSCOPY  01/18/2008   RMR: Normal esophagus, normal  stomach  .  ESOPHAGOGASTRODUODENOSCOPY (EGD) WITH PROPOFOL N/A 02/09/2016   Normal esophagus, small hiatal hernia, portal hypertensive gastropathy, normal second portion of duodenum. Due in July 2019  . ESOPHAGOGASTRODUODENOSCOPY (EGD) WITH PROPOFOL N/A 12/20/2017   Dr. Darrick Penna: benign appearing esophageal stenosis s/p dilation, mild pyloric stenosis s/p biopsy and dilation, mild duodenitis.   Marland Kitchen HYSTEROSCOPY  05/11/2016   Procedure: HYSTEROSCOPY;  Surgeon: Tilda Burrow, MD;  Location: [facility];  Service: Gynecology;;  . KNEE SURGERY     left knee  . MULTIPLE EXTRACTIONS WITH ALVEOLOPLASTY N/A 10/15/2013   Procedure: MULTIPLE EXTRACTION WITH ALVEOLOPLASTY;  Surgeon: Georgia Lopes, DDS;  Location: [facility];  Service: Oral Surgery;  Laterality: N/A;  . POLYPECTOMY N/A 03/08/2013   Procedure: POLYPECTOMY;  Surgeon: Corbin Ade, MD;  Location: [facility];  Service: Endoscopy;  Laterality: N/A;  . POLYPECTOMY N/A 05/11/2016   Procedure: REMOVAL OF ENDOMETRIAL POLYP;  Surgeon: Tilda Burrow, MD;  Location: [facility];  Service: Gynecology;  Laterality: N/A;  . RESECTION DISTAL CLAVICAL Right 01/30/2019   Procedure: RESECTION DISTAL CLAVICAL;  Surgeon: Vickki Hearing, MD;  Location: [facility];  Service: Orthopedics;  Laterality: Right;  . SHOULDER OPEN ROTATOR CUFF REPAIR Right 01/30/2019   Procedure: ROTATOR CUFF REPAIR SHOULDER OPEN;  Surgeon: Vickki Hearing, MD;  Location: [facility];  Service: Orthopedics;  Laterality: Right;  pt to arrive at 7:30 for PICC at 8:00  . TOTAL KNEE ARTHROPLASTY Left 02/05/2015   Procedure: LEFT TOTAL KNEE ARTHROPLASTY;  Surgeon: Vickki Hearing, MD;  Location: [facility];  Service: Orthopedics;  Laterality: Left;    Current Outpatient Medications  Medication Sig Dispense Refill  . albuterol (PROVENTIL HFA;VENTOLIN HFA) 108 (90 Base) MCG/ACT inhaler Inhale 1-2 puffs into the lungs every 6 (six) hours as needed for wheezing or shortness of breath.     . budesonide-formoterol (SYMBICORT)  160-4.5 MCG/ACT inhaler Inhale 2 puffs into the lungs in the morning and at bedtime.     . Cyanocobalamin (B-12) 2500 MCG TABS Take 2,500 mcg by mouth daily.    . cyclobenzaprine (FLEXERIL) 5 MG tablet Take 1 tablet (5 mg total) by mouth 2 (two) times daily as needed for muscle spasms. 30 tablet 1  . gabapentin (NEURONTIN) 100 MG capsule Take 1 capsule (100 mg total) by mouth 3 (three) times daily. (Patient taking differently: Take 100 mg by mouth at bedtime. ) 90 capsule 2  . hydrALAZINE (APRESOLINE) 50 MG tablet Take 1 tablet (50 mg total) by mouth every 8 (eight) hours. 90 tablet 1  . HYDROcodone-acetaminophen (NORCO/VICODIN) 5-325 MG tablet Take 1 tablet by mouth every 6 (six) hours as needed for moderate pain. 21 tablet 0  . hydrOXYzine (ATARAX/VISTARIL) 10 MG tablet Take 10 mg by mouth daily.     Marland Kitchen labetalol (NORMODYNE) 100 MG tablet Take 2 tablets (200 mg total) by mouth 2 (two) times daily. 120 tablet 1  . megestrol (MEGACE) 40 MG tablet Take 40 mg by mouth daily.    . ondansetron (ZOFRAN ODT) 4 MG disintegrating tablet Take 1 tablet (4 mg total) by mouth 3 (three) times  daily. '4mg'$  ODT q4 hours prn nausea/vomit (Patient taking differently: Take 4 mg by mouth as needed for nausea or vomiting. ) 60 tablet 0  . ondansetron (ZOFRAN) 4 MG tablet Take 4 mg by mouth every 6 (six) hours as needed for nausea.    . pantoprazole (PROTONIX) 40 MG tablet Take 1 tablet (40 mg total) by mouth daily. 30 minutes before breakfast 90 tablet 3  . Plecanatide (TRULANCE) 3 MG TABS Take 3 mg by mouth daily. 90 tablet 3  . potassium chloride (MICRO-K) 10 MEQ CR capsule Take 2 capsules (20 mEq total) by mouth 2 (two) times daily for 3 days. Then, take 2 capsules once daily for a week. 20 capsule 0   No current facility-administered medications for this visit.    Allergies as of 05/13/2020 - Review Complete 05/13/2020  Allergen Reaction Noted  . Penicillins Rash     Family History  Problem Relation Age of  Onset  . Colon cancer Brother 29          . Multiple myeloma Brother   . Liver cancer Sister   . Prostate cancer Brother   . Pancreatic cancer Brother   . Cancer Mother        breast  . Asthma Mother     Social History   Socioeconomic History  . Marital status: Single    Spouse name: Not on file  . Number of children: 0  . Years of education: Not on file  . Highest education level: Not on file  Occupational History  . Occupation: umemployed   Tobacco Use  . Smoking status: Current Every Day Smoker    Packs/day: 1.00    Years: 40.00    Pack years: 40.00    Types: Cigarettes  . Smokeless tobacco: Never Used  . Tobacco comment: Smokes one pack of cigarettes daily  Vaping Use  . Vaping Use: Never used  Substance and Sexual Activity  . Alcohol use: Not Currently    Alcohol/week: 1.0 standard drink    Types: 1 Cans of beer per week    Comment: last drink 10/17/2019  . Drug use: Not Currently    Types: Marijuana    Comment: history of marijuana in past- almost 2 years ago  . Sexual activity: Never    Birth control/protection: None  Other Topics Concern  . Not on file  Social History Narrative  . Not on file   Social Determinants of Health   Financial Resource Strain:   . Difficulty of Paying Living Expenses: Not on file  Food Insecurity:   . Worried About Charity fundraiser in the Last Year: Not on file  . Ran Out of Food in the Last Year: Not on file  Transportation Needs:   . Lack of Transportation (Medical): Not on file  . Lack of Transportation (Non-Medical): Not on file  Physical Activity:   . Days of Exercise per Week: Not on file  . Minutes of Exercise per Session: Not on file  Stress:   . Feeling of Stress : Not on file  Social Connections:   . Frequency of Communication with Friends and Family: Not on file  . Frequency of Social Gatherings with Friends and Family: Not on file  . Attends Religious Services: Not on file  . Active Member of Clubs or  Organizations: Not on file  . Attends Archivist Meetings: Not on file  . Marital Status: Not on file    Review of Systems: Gen:  Denies fever, chills, anorexia. Denies fatigue, weakness, weight loss.  CV: Denies chest pain, palpitations, syncope, peripheral edema, and claudication. Ressee HPI Derm: Denies rash, itching, dry skin Psych: Denies depression, anxiety, memory loss, confusion. No homicidal or suicidal ideation.  Heme: Denies bruising, bleeding, and enlarged lymph nodes.  Physical Exam: BP 111/67   Pulse 92   Temp (!) 97 F (36.1 C) (Oral)   Ht $R'5\' 6"'iB$  (1.676 m)   Wt 193 lb 9.6 oz (87.8 kg)   BMI 31.25 kg/m  General:   Alert and oriented. No distress noted. Pleasant and cooperative.  Head:  Normocephalic and atraumatic. Eyes:  Conjuctiva clear without scleral icterus. Mouth:  Oral mucosa pink and moist. Good dentition. No lesions. Abdomen:  +BS, soft, round but non-tender and non-distended. No rebound or guarding. No HSM or masses noted. Msk:  Symmetrical without gross deformities. Normal posture. Extremities:  Without edema. Neurologic:  Alert and  oriented x4 Psych:  Alert and cooperative. Normal mood and affect.  ASSESSMENT: Colleen Lee is a 64 y.o. female presenting today with history of advanced Metavir score (4) in past and concern for cirrhosis historically due to Hep C/ETOH, successfully achieving SVR in the past, chronic GERD, constipation, pancreatitis due to alcohol earlier this year, here for follow-up.  Advanced fibrosis/possible underlying cirrhosis: normal spleen, thrombocytopenia resolved, portal gastropathy on EGD in 2017 and 2019 without stigmata of portal hypertension. With advanced fibrosis score and risk of progression to cirrhosis, we are following with serial imaging and EGDs. EGD due now with last in 2019.   History of pancreatitis: earlier this year. Likely ETOH related. CT with pancreatic protocol ordered.   History of constipation:  now with more frequent stool. Stop Trulance. She has increased her "greens". Will see how she does without Trulance. Call with progress report.   GERD: controlled with Protonix daily.    PLAN:  Routine labs today for cirrhosis care  CT with pancreatic protocol  Proceed with upper endoscopy by Dr. Gala Romney in near future using Propofol: the risks, benefits, and alternatives have been discussed with the patient in detail. The patient states understanding and desires to proceed.   Colonoscopy 2024  6 month return   Annitta Needs, PhD, [facility]

## 2020-05-13 NOTE — Telephone Encounter (Signed)
PA approved via [facility] for EGD. Auth# [id number] dates 06/12/2020-08/01/2020

## 2020-05-13 NOTE — Telephone Encounter (Signed)
Rx request 

## 2020-05-19 ENCOUNTER — Other Ambulatory Visit: Payer: Self-pay | Admitting: Orthopedic Surgery

## 2020-05-19 MED ORDER — HYDROCODONE-ACETAMINOPHEN 5-325 MG PO TABS
1.0000 | ORAL_TABLET | Freq: Four times a day (QID) | ORAL | 0 refills | Status: DC | PRN
Start: 2020-05-19 — End: 2020-05-20

## 2020-05-19 NOTE — Telephone Encounter (Signed)
Patient called for refill *:  HYDROcodone-acetaminophen (NORCO/VICODIN) 5-325 MG tablet 21 tablet  - [place]  *Patient was last seen 12/25/19 - please advise.

## 2020-05-20 ENCOUNTER — Emergency Department (HOSPITAL_COMMUNITY)
Admission: EM | Admit: 2020-05-20 | Discharge: 2020-05-20 | Disposition: A | Discharge status: Home / Self Care | Payer: Medicare Other | Attending: Emergency Medicine | Admitting: Emergency Medicine

## 2020-05-20 ENCOUNTER — Encounter (HOSPITAL_COMMUNITY): Payer: Self-pay

## 2020-05-20 ENCOUNTER — Other Ambulatory Visit: Payer: Self-pay

## 2020-05-20 DIAGNOSIS — Z96652 Presence of left artificial knee joint: Secondary | ICD-10-CM | POA: Insufficient documentation

## 2020-05-20 DIAGNOSIS — F17210 Nicotine dependence, cigarettes, uncomplicated: Secondary | ICD-10-CM | POA: Diagnosis not present

## 2020-05-20 DIAGNOSIS — Z7951 Long term (current) use of inhaled steroids: Secondary | ICD-10-CM | POA: Diagnosis not present

## 2020-05-20 DIAGNOSIS — I10 Essential (primary) hypertension: Secondary | ICD-10-CM | POA: Diagnosis not present

## 2020-05-20 DIAGNOSIS — J45909 Unspecified asthma, uncomplicated: Secondary | ICD-10-CM | POA: Insufficient documentation

## 2020-05-20 DIAGNOSIS — K859 Acute pancreatitis without necrosis or infection, unspecified: Secondary | ICD-10-CM | POA: Diagnosis not present

## 2020-05-20 DIAGNOSIS — R000 Tachycardia, unspecified: Secondary | ICD-10-CM | POA: Diagnosis not present

## 2020-05-20 DIAGNOSIS — Z79899 Other long term (current) drug therapy: Secondary | ICD-10-CM | POA: Diagnosis not present

## 2020-05-20 DIAGNOSIS — R1084 Generalized abdominal pain: Secondary | ICD-10-CM | POA: Diagnosis present

## 2020-05-20 LAB — URINALYSIS, MICROSCOPIC (REFLEX): RBC / HPF: NONE SEEN RBC/hpf (ref 0–5)

## 2020-05-20 LAB — URINALYSIS, ROUTINE W REFLEX MICROSCOPIC
Glucose, UA: 100 mg/dL — AB
Hgb urine dipstick: NEGATIVE
Ketones, ur: NEGATIVE mg/dL
Leukocytes,Ua: NEGATIVE
Nitrite: NEGATIVE
Protein, ur: 100 mg/dL — AB
Specific Gravity, Urine: >1.03 — ABNORMAL HIGH (ref 1.005–1.030)
pH: 5 (ref 5.0–8.0)

## 2020-05-20 LAB — LIPASE, BLOOD: Lipase: 171 U/L — ABNORMAL HIGH (ref 11–51)

## 2020-05-20 LAB — COMPREHENSIVE METABOLIC PANEL
ALT: 16 U/L (ref 0–44)
AST: 17 U/L (ref 15–41)
Albumin: 4.4 g/dL (ref 3.5–5.0)
Alkaline Phosphatase: 69 U/L (ref 38–126)
Anion gap: 14 (ref 5–15)
BUN: 11 mg/dL (ref 8–23)
CO2: 24 mmol/L (ref 22–32)
Calcium: 11.6 mg/dL — ABNORMAL HIGH (ref 8.9–10.3)
Chloride: 98 mmol/L (ref 98–111)
Creatinine, Ser: 1.17 mg/dL — ABNORMAL HIGH (ref 0.44–1.00)
GFR, Estimated: 49 mL/min — ABNORMAL LOW (ref >60)
Glucose, Bld: 123 mg/dL — ABNORMAL HIGH (ref 70–99)
Potassium: 4.3 mmol/L (ref 3.5–5.1)
Sodium: 136 mmol/L (ref 135–145)
Total Bilirubin: 1.3 mg/dL — ABNORMAL HIGH (ref 0.3–1.2)
Total Protein: 9.4 g/dL — ABNORMAL HIGH (ref 6.5–8.1)

## 2020-05-20 LAB — CBC
HCT: 42.8 % (ref 36.0–46.0)
Hemoglobin: 14.3 g/dL (ref 12.0–15.0)
MCH: 32.6 pg (ref 26.0–34.0)
MCHC: 33.4 g/dL (ref 30.0–36.0)
MCV: 97.5 fL (ref 80.0–100.0)
Platelets: 226 10*3/uL (ref 150–400)
RBC: 4.39 MIL/uL (ref 3.87–5.11)
RDW: 14.4 % (ref 11.5–15.5)
WBC: 10.1 10*3/uL (ref 4.0–10.5)
nRBC: 0 % (ref 0.0–0.2)

## 2020-05-20 MED ORDER — ONDANSETRON HCL 4 MG/2ML IJ SOLN
4.0000 mg | Freq: Once | INTRAMUSCULAR | Status: AC
  Administered 2020-05-20: 4 mg via INTRAMUSCULAR
  Filled 2020-05-20: qty 2

## 2020-05-20 MED ORDER — HYDROMORPHONE HCL 1 MG/ML IJ SOLN
1.0000 mg | Freq: Once | INTRAMUSCULAR | Status: AC
  Administered 2020-05-20: 1 mg via INTRAMUSCULAR
  Filled 2020-05-20: qty 1

## 2020-05-20 MED ORDER — HYDROCODONE-ACETAMINOPHEN 5-325 MG PO TABS: 1.0000 | ORAL_TABLET | Freq: Four times a day (QID) | ORAL | 0 refills | Status: AC | PRN

## 2020-05-20 NOTE — Discharge Instructions (Signed)
Return if any problems.

## 2020-05-20 NOTE — ED Provider Notes (Signed)
[facility] EMERGENCY DEPARTMENT Provider Note   CSN: [id number] Arrival date & time: 05/20/20  0932     History Chief Complaint  Patient presents with  . Abdominal Pain    Colleen Lee is a 64 y.o. female.  Pt complains of  Abdominal pain.  Pt has a history of pancreatitis and reports this feels the same.  Pt is followed by Gi and is scheduled for studies.    The history is provided by the patient. No language interpreter was used.  Abdominal Pain Pain location:  Generalized Pain quality: aching   Pain radiates to:  Does not radiate Pain severity:  Moderate Timing:  Constant Progression:  Worsening Chronicity:  New Relieved by:  Nothing Worsened by:  Nothing Ineffective treatments:  None tried Associated symptoms: nausea   Associated symptoms: no fever and no vomiting        Past Medical History:  Diagnosis Date  . Asthma   . Chronic abdominal pain   . Chronic back pain   . Chronic constipation   . Cirrhosis ([place])    Metavir score F4 on elastography 2015  . Cyclical vomiting   . Fibroids   . Fibromyalgia   . GERD (gastroesophageal reflux disease)   . H. pylori infection 2014   treated with pylera, had to be treated again as it was not eradicated. Urea breath test then negative after subsequent treatment.   . [name]    HCV RNA positive 09/2012  . Hypertension   . Marijuana use   . Nausea and vomiting    chronic, recurrent  . PONV (postoperative nausea and vomiting)    pt thinks maybe once she had N&V  . Sciatica of left side     Patient Active Problem List   Diagnosis Date Noted  . Acute pancreatitis 10/17/2019  . ETOH abuse 10/17/2019  . S/P right rotator cuff repair 01/30/19 02/06/2019  . Nontraumatic complete tear of right rotator cuff   . Arthritis of right acromioclavicular joint   . S/P total knee replacement, 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/24/2018  . Shoulder impingement, right 05/24/2018  . Dehydration   . Intractable cyclical vomiting 08/65/7846    . Pyloric stenosis in adult   . Thrombocytopenia ([place]) 12/20/2017  . Leukocytosis   . Malnutrition of moderate degree 12/17/2017  . Acute renal failure (ARF) ([place]) 12/15/2017  . Chronic abdominal pain 12/15/2017  . Gastroenteritis 06/14/2016  . Nausea with vomiting 06/10/2016  . Uterine enlargement 06/09/2016  . Intractable nausea and vomiting 06/08/2016  . Acute infective gastroenteritis 06/08/2016  . Diarrhea 06/08/2016  . Essential hypertension 06/08/2016  . GERD (gastroesophageal reflux disease) 06/08/2016  . Abdominal pain   . Endometrial polyp 04/15/2016  . PMB (postmenopausal bleeding) 02/27/2016  . Alcoholic cirrhosis of liver without ascites ([place])   . Asthma 01/21/2016  . Hepatic cirrhosis ([place]) 09/22/2015  . Arthritis of knee, degenerative 02/05/2015  . History of Helicobacter pylori infection 10/30/2014  . Chronic [name] with cirrhosis ([place]) 04/11/2014  . De Quervain's disease (radial styloid tenosynovitis) 12/25/2013  . Anorexia 11/21/2012  . FH: colon cancer 11/21/2012  . Early satiety 10/25/2012  . Bowel habit changes 10/25/2012  . Abdominal pain, epigastric 10/25/2012  . Abdominal bloating 10/25/2012  . Constipation 10/25/2012  . Abnormal weight loss 10/25/2012  . Chronic viral [name] ([place]) 10/25/2012  . Radicular pain of left lower extremity 09/28/2012  . Back pain 09/28/2012  . Sciatica 08/10/2011  . S/P arthroscopy of left knee 08/10/2011  .  Tibial plateau fracture 08/10/2011  . Pain in joint, lower leg 02/12/2011  . Stiffness of joint, not elsewhere classified, lower leg 02/12/2011  . 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 Meniscus, medial, derangement 12/29/2010  . CLOSED FRACTURE OF UPPER END OF TIBIA 08/12/2010    Past Surgical History:  Procedure Laterality Date  . BALLOON DILATION  12/20/2017   Procedure: BALLOON DILATION;  Surgeon: Danie Binder, MD;  Location: [facility];  Service: Endoscopy;;  pyloric dilation  . BIOPSY   12/20/2017   Procedure: BIOPSY;  Surgeon: Danie Binder, MD;  Location: [facility];  Service: Endoscopy;;  duodenal and gastric biopsy  . COLONOSCOPY  01/18/2008   JGO:TLXBWI rectum.  Long redundant colon, a diminutive sigmoid polyp status post cold biopsy removed. Hyperplastic polyp. Repeat colonoscopy June 2014 due to family history of colon cancer  . COLONOSCOPY WITH ESOPHAGOGASTRODUODENOSCOPY (EGD) N/A 11/02/2012   OMB:TDHRCBUL gastric mucosa of doubtful, +H.pylori. Incomplete colonoscopy due to patient unable to tolerate exam, proximal colon seen. Patient refused ACBE.  Marland Kitchen COLONOSCOPY WITH PROPOFOL N/A 03/08/2013   Dr. Gala Romney: colonic polyp-removed as scribed above. Internal Hemorrhoids. Pathology did not reveal any colonic tissue, only mucus. SURVEILLANCE DUE Aug 2019  . COLONOSCOPY WITH PROPOFOL N/A 01/19/2018   Dr. Gala Romney: 6 mm cecal inflammatoy polyp, otherwise normal., Surveilance in 5 year s  . ESOPHAGEAL DILATION  12/20/2017   Procedure: ESOPHAGEAL DILATION;  Surgeon: Danie Binder, MD;  Location: [facility];  Service: Endoscopy;;  . ESOPHAGOGASTRODUODENOSCOPY  01/18/2008   RMR: Normal esophagus, normal  stomach  . ESOPHAGOGASTRODUODENOSCOPY (EGD) WITH PROPOFOL N/A 02/09/2016   Normal esophagus, small hiatal hernia, portal hypertensive gastropathy, normal second portion of duodenum. Due in July 2019  . ESOPHAGOGASTRODUODENOSCOPY (EGD) WITH PROPOFOL N/A 12/20/2017   Dr. Oneida Alar: benign appearing esophageal stenosis s/p dilation, mild pyloric stenosis s/p biopsy and dilation, mild duodenitis.   Marland Kitchen HYSTEROSCOPY  05/11/2016   Procedure: HYSTEROSCOPY;  Surgeon: Jonnie Kind, MD;  Location: [facility];  Service: Gynecology;;  . KNEE SURGERY     left knee  . MULTIPLE EXTRACTIONS WITH ALVEOLOPLASTY N/A 10/15/2013   Procedure: MULTIPLE EXTRACTION WITH ALVEOLOPLASTY;  Surgeon: Gae Bon, DDS;  Location: [facility];  Service: Oral Surgery;  Laterality: N/A;  . POLYPECTOMY N/A 03/08/2013    Procedure: POLYPECTOMY;  Surgeon: Daneil Dolin, MD;  Location: [facility];  Service: Endoscopy;  Laterality: N/A;  . POLYPECTOMY N/A 05/11/2016   Procedure: REMOVAL OF ENDOMETRIAL POLYP;  Surgeon: Jonnie Kind, MD;  Location: [facility];  Service: Gynecology;  Laterality: N/A;  . RESECTION DISTAL CLAVICAL Right 01/30/2019   Procedure: RESECTION DISTAL CLAVICAL;  Surgeon: Carole Civil, MD;  Location: [facility];  Service: Orthopedics;  Laterality: Right;  . SHOULDER OPEN ROTATOR CUFF REPAIR Right 01/30/2019   Procedure: ROTATOR CUFF REPAIR SHOULDER OPEN;  Surgeon: Carole Civil, MD;  Location: [facility];  Service: Orthopedics;  Laterality: Right;  pt to arrive at 7:30 for PICC at 8:00  . TOTAL KNEE ARTHROPLASTY Left 02/05/2015   Procedure: LEFT TOTAL KNEE ARTHROPLASTY;  Surgeon: Carole Civil, MD;  Location: [facility];  Service: Orthopedics;  Laterality: Left;     OB History    Gravida      Para      Term      Preterm      AB      Living  1     SAB      TAB  Ectopic      Multiple      Live Births              Family History  Problem Relation Age of Onset  . Colon cancer Brother 4          . Multiple myeloma Brother   . Liver cancer Sister   . Prostate cancer Brother   . Pancreatic cancer Brother   . Cancer Mother        breast  . Asthma Mother     Social History   Tobacco Use  . Smoking status: Current Every Day Smoker    Packs/day: 1.00    Years: 40.00    Pack years: 40.00    Types: Cigarettes  . Smokeless tobacco: Never Used  . Tobacco comment: Smokes one pack of cigarettes every 1.5 days  Vaping Use  . Vaping Use: Never used  Substance Use Topics  . Alcohol use: Yes    Alcohol/week: 0.0 standard drinks    Comment: occ  . Drug use: Not Currently    Types: Marijuana    Comment: history of marijuana in past- almost 2 years ago    Home Medications Prior to Admission medications   Medication Sig Start Date End Date Taking? Authorizing  Provider  albuterol (PROVENTIL HFA;VENTOLIN HFA) 108 (90 Base) MCG/ACT inhaler Inhale 1-2 puffs into the lungs every 6 (six) hours as needed for wheezing or shortness of breath.     [provider]  budesonide-formoterol (SYMBICORT) 160-4.5 MCG/ACT inhaler Inhale 2 puffs into the lungs in the morning and at bedtime.     [provider]  Cyanocobalamin (B-12) 2500 MCG TABS Take 2,500 mcg by mouth daily.    [provider]  cyclobenzaprine (FLEXERIL) 5 MG tablet Take 1 tablet (5 mg total) by mouth 2 (two) times daily as needed for muscle spasms. 12/07/19   Carole Civil, MD  gabapentin (NEURONTIN) 100 MG capsule Take 1 capsule (100 mg total) by mouth 3 (three) times daily. Patient taking differently: Take 100 mg by mouth at bedtime.  12/22/18   Carole Civil, MD  hydrALAZINE (APRESOLINE) 50 MG tablet Take 1 tablet (50 mg total) by mouth every 8 (eight) hours. 01/30/18   Barton Dubois, MD  HYDROcodone-acetaminophen (NORCO/VICODIN) 5-325 MG tablet Take 1 tablet by mouth every 6 (six) hours as needed for up to 3 days for moderate pain. 05/20/20 05/23/20  Fransico Meadow, PA-C  hydrOXYzine (ATARAX/VISTARIL) 10 MG tablet Take 10 mg by mouth daily.  11/30/17   [provider]  labetalol (NORMODYNE) 100 MG tablet Take 2 tablets (200 mg total) by mouth 2 (two) times daily. 01/30/18   Barton Dubois, MD  megestrol (MEGACE) 40 MG tablet Take 40 mg by mouth daily.    [provider]  ondansetron (ZOFRAN ODT) 4 MG disintegrating tablet Take 1 tablet (4 mg total) by mouth 3 (three) times daily. $RemoveBefo'4mg'cVhcNcYhEOm$  ODT q4 hours prn nausea/vomit Patient taking differently: Take 4 mg by mouth as needed for nausea or vomiting.  12/22/17   Kathie Dike, MD  ondansetron (ZOFRAN) 4 MG tablet Take 4 mg by mouth every 6 (six) hours as needed for nausea. 12/28/19   [provider]  pantoprazole (PROTONIX) 40 MG tablet Take 1 tablet (40 mg total) by mouth daily. 30 minutes before  breakfast 09/07/19   Annitta Needs, NP  Plecanatide (TRULANCE) 3 MG TABS Take 3 mg by mouth daily. 09/07/19   Annitta Needs, NP  Allergies    Penicillins  Review of Systems   Review of Systems  Constitutional: Negative for fever.  Gastrointestinal: Positive for abdominal pain and nausea. Negative for vomiting.  All other systems reviewed and are negative.   Physical Exam Updated Vital Signs BP 118/83   Pulse 89   Temp 98.1 F (36.7 C)   Resp 18   Ht 5\' 6"  (1.676 m)   Wt 87.5 kg   SpO2 100%   BMI 31.15 kg/m   Physical Exam Vitals and nursing note reviewed.  Constitutional:      Appearance: She is well-developed.  HENT:     Head: Normocephalic.  Pulmonary:     Effort: Pulmonary effort is normal.  Abdominal:     General: Abdomen is flat. There is no distension.     Palpations: Abdomen is soft.     Tenderness: There is no abdominal tenderness.  Musculoskeletal:        General: Normal range of motion.     Cervical back: Normal range of motion.  Skin:    General: Skin is warm.     Capillary Refill: Capillary refill takes less than 2 seconds.  Neurological:     Mental Status: She is alert and oriented to person, place, and time.     ED Results / Procedures / Treatments   Labs (all labs ordered are listed, but only abnormal results are displayed) Labs Reviewed  LIPASE, BLOOD - Abnormal; Notable for the following components:      Result Value   Lipase 171 (*)    All other components within normal limits  COMPREHENSIVE METABOLIC PANEL - Abnormal; Notable for the following components:   Glucose, Bld 123 (*)    Creatinine, Ser 1.17 (*)    Calcium 11.6 (*)    Total Protein 9.4 (*)    Total Bilirubin 1.3 (*)    GFR, Estimated 49 (*)    All other components within normal limits  URINALYSIS, ROUTINE W REFLEX MICROSCOPIC - Abnormal; Notable for the following components:   APPearance HAZY (*)    Specific Gravity, Urine >1.030 (*)    Glucose, UA 100 (*)    Bilirubin  Urine SMALL (*)    Protein, ur 100 (*)    All other components within normal limits  URINALYSIS, MICROSCOPIC (REFLEX) - Abnormal; Notable for the following components:   Bacteria, UA RARE (*)    All other components within normal limits  CBC    EKG None  Radiology No results found.  Procedures Procedures (including critical care time)  Medications Ordered in ED Medications  HYDROmorphone (DILAUDID) injection 1 mg (1 mg Intramuscular Given 05/20/20 1141)  ondansetron (ZOFRAN) injection 4 mg (4 mg Intramuscular Given 05/20/20 1142)    ED Course  I have reviewed the triage vital signs and the nursing notes.  Pertinent labs & imaging results that were available during my care of the patient were reviewed by me and considered in my medical decision making (see chart for details).    MDM Rules/Calculators/A&P                          MDM:  Pt has an elevated lipase.  Pt reports relief with pain medications.  Pt reports she is out of home pain medication.  Pt given 12 hydrocodone tablets.  Pt advised to follow up with gi for evaluation Final Clinical Impression(s) / ED Diagnoses Final diagnoses:  Acute pancreatitis, unspecified complication status, unspecified pancreatitis  type    Rx / DC Orders ED Discharge Orders         Ordered    HYDROcodone-acetaminophen (NORCO/VICODIN) 5-325 MG tablet  Every 6 hours PRN        05/20/20 1613        An After Visit Summary was printed and given to the patient.    Fransico Meadow, [place] 05/20/20 1645    Milton Ferguson, MD 05/23/20 0900

## 2020-05-20 NOTE — ED Triage Notes (Signed)
Pt c/o upper abd pain since Saturday.  Reports n/v this morning.  Denies diarrhea.

## 2020-05-26 ENCOUNTER — Other Ambulatory Visit: Payer: Self-pay | Admitting: Orthopedic Surgery

## 2020-05-26 MED ORDER — HYDROCODONE-ACETAMINOPHEN 5-325 MG PO TABS
1.0000 | ORAL_TABLET | Freq: Four times a day (QID) | ORAL | 0 refills | Status: DC | PRN
Start: 2020-05-26 — End: 2020-06-02

## 2020-05-26 NOTE — Telephone Encounter (Signed)
Patient called to request refill - medication not on current list  - states it is HYDROcodone-acetaminophen (NORCO) 5-325 MG per tablet    - [facility]

## 2020-05-29 ENCOUNTER — Telehealth: Payer: Self-pay | Admitting: Internal Medicine

## 2020-05-29 DIAGNOSIS — I10 Essential (primary) hypertension: Secondary | ICD-10-CM | POA: Diagnosis not present

## 2020-05-29 DIAGNOSIS — J449 Chronic obstructive pulmonary disease, unspecified: Secondary | ICD-10-CM | POA: Diagnosis not present

## 2020-05-29 NOTE — Telephone Encounter (Signed)
Routing to Roseanne Kaufman NP for advice.

## 2020-05-29 NOTE — Telephone Encounter (Signed)
[phone] patient called stating that when she has to have an iv they send someone from [place] because she is a hard to get an iv started.  Wants to make sure they will be able to get her IV in at her procedure

## 2020-06-02 ENCOUNTER — Other Ambulatory Visit: Payer: Self-pay | Admitting: Orthopedic Surgery

## 2020-06-02 MED ORDER — HYDROCODONE-ACETAMINOPHEN 5-325 MG PO TABS
1.0000 | ORAL_TABLET | Freq: Four times a day (QID) | ORAL | 0 refills | Status: DC | PRN
Start: 2020-06-02 — End: 2020-06-09

## 2020-06-02 NOTE — Telephone Encounter (Signed)
Patient request refill on  HYDROcodone-Acetaminophen (Tab) NORCO/VICODIN 5-325 MG Take 1 tablet by mouth every 6 (six) hours as needed for moderate pain.     Pharmacy [facility]

## 2020-06-04 ENCOUNTER — Encounter (HOSPITAL_COMMUNITY): Payer: Self-pay

## 2020-06-04 ENCOUNTER — Ambulatory Visit (HOSPITAL_COMMUNITY)
Admission: RE | Admit: 2020-06-04 | Discharge: 2020-06-04 | Disposition: A | Discharge status: Home / Self Care | Payer: Medicare Other | Source: Ambulatory Visit | Attending: Gastroenterology | Admitting: Gastroenterology

## 2020-06-04 ENCOUNTER — Other Ambulatory Visit: Payer: Self-pay

## 2020-06-04 DIAGNOSIS — K858 Other acute pancreatitis without necrosis or infection: Secondary | ICD-10-CM

## 2020-06-04 MED ORDER — IOHEXOL 300 MG/ML  SOLN: 100.0000 mL | Freq: Once | INTRAMUSCULAR | Status: DC | PRN

## 2020-06-05 ENCOUNTER — Telehealth: Payer: Self-pay | Admitting: Internal Medicine

## 2020-06-05 NOTE — Telephone Encounter (Signed)
Tried to call pt at [phone], no answer, VM is full. Tried to call [phone], no answer, LMOVM for return call.

## 2020-06-05 NOTE — Telephone Encounter (Signed)
Please call patient. [phone] or [phone]

## 2020-06-06 ENCOUNTER — Telehealth: Payer: Self-pay | Admitting: Internal Medicine

## 2020-06-06 NOTE — Telephone Encounter (Signed)
See other phone note already started. 

## 2020-06-06 NOTE — Telephone Encounter (Signed)
See other phone already started. 

## 2020-06-06 NOTE — Telephone Encounter (Signed)
Pt returning call. Please call her at [phone] or [phone]

## 2020-06-06 NOTE — Telephone Encounter (Signed)
Spoke to pt, gave her phone# for Melanie at [place] to let them know she is difficult stick for IV.

## 2020-06-09 ENCOUNTER — Encounter (HOSPITAL_COMMUNITY): Payer: Self-pay | Admitting: Internal Medicine

## 2020-06-09 ENCOUNTER — Other Ambulatory Visit: Payer: Self-pay | Admitting: Orthopedic Surgery

## 2020-06-09 MED ORDER — HYDROCODONE-ACETAMINOPHEN 5-325 MG PO TABS
1.0000 | ORAL_TABLET | Freq: Four times a day (QID) | ORAL | 0 refills | Status: DC | PRN
Start: 2020-06-09 — End: 2020-06-16

## 2020-06-09 NOTE — Telephone Encounter (Signed)
Patient requests refill on Hydrocodone/Acetaminophen 5-325 mgs.  Qty 21  Sig: Take 1 tablet by mouth every 6 (six) hours as needed for moderate pain.  Patient states she uses [facility]

## 2020-06-10 ENCOUNTER — Emergency Department (HOSPITAL_COMMUNITY): Payer: Medicare Other

## 2020-06-10 ENCOUNTER — Emergency Department (HOSPITAL_COMMUNITY)
Admission: EM | Admit: 2020-06-10 | Discharge: 2020-06-10 | Disposition: A | Discharge status: Home / Self Care | Payer: Medicare Other | Attending: Emergency Medicine | Admitting: Emergency Medicine

## 2020-06-10 ENCOUNTER — Encounter (HOSPITAL_COMMUNITY): Payer: Self-pay

## 2020-06-10 ENCOUNTER — Other Ambulatory Visit: Payer: Self-pay

## 2020-06-10 DIAGNOSIS — Z79899 Other long term (current) drug therapy: Secondary | ICD-10-CM | POA: Insufficient documentation

## 2020-06-10 DIAGNOSIS — I10 Essential (primary) hypertension: Secondary | ICD-10-CM | POA: Insufficient documentation

## 2020-06-10 DIAGNOSIS — R109 Unspecified abdominal pain: Secondary | ICD-10-CM | POA: Diagnosis not present

## 2020-06-10 DIAGNOSIS — K859 Acute pancreatitis without necrosis or infection, unspecified: Secondary | ICD-10-CM | POA: Insufficient documentation

## 2020-06-10 DIAGNOSIS — Z96652 Presence of left artificial knee joint: Secondary | ICD-10-CM | POA: Insufficient documentation

## 2020-06-10 DIAGNOSIS — J45909 Unspecified asthma, uncomplicated: Secondary | ICD-10-CM | POA: Insufficient documentation

## 2020-06-10 DIAGNOSIS — F17210 Nicotine dependence, cigarettes, uncomplicated: Secondary | ICD-10-CM | POA: Diagnosis not present

## 2020-06-10 DIAGNOSIS — R1084 Generalized abdominal pain: Secondary | ICD-10-CM | POA: Diagnosis not present

## 2020-06-10 DIAGNOSIS — Z7951 Long term (current) use of inhaled steroids: Secondary | ICD-10-CM | POA: Diagnosis not present

## 2020-06-10 LAB — CBC WITH DIFFERENTIAL/PLATELET
Abs Immature Granulocytes: 0.03 10*3/uL (ref 0.00–0.07)
Basophils Absolute: 0 10*3/uL (ref 0.0–0.1)
Basophils Relative: 0 %
Eosinophils Absolute: 0.1 10*3/uL (ref 0.0–0.5)
Eosinophils Relative: 1 %
HCT: 39.4 % (ref 36.0–46.0)
Hemoglobin: 12.8 g/dL (ref 12.0–15.0)
Immature Granulocytes: 0 %
Lymphocytes Relative: 15 %
Lymphs Abs: 1.1 10*3/uL (ref 0.7–4.0)
MCH: 31.8 pg (ref 26.0–34.0)
MCHC: 32.5 g/dL (ref 30.0–36.0)
MCV: 98 fL (ref 80.0–100.0)
Monocytes Absolute: 0.5 10*3/uL (ref 0.1–1.0)
Monocytes Relative: 7 %
Neutro Abs: 5.6 10*3/uL (ref 1.7–7.7)
Neutrophils Relative %: 77 %
Platelets: 213 10*3/uL (ref 150–400)
RBC: 4.02 MIL/uL (ref 3.87–5.11)
RDW: 14.2 % (ref 11.5–15.5)
WBC: 7.4 10*3/uL (ref 4.0–10.5)
nRBC: 0 % (ref 0.0–0.2)

## 2020-06-10 LAB — COMPREHENSIVE METABOLIC PANEL
ALT: 30 U/L (ref 0–44)
AST: 39 U/L (ref 15–41)
Albumin: 4.2 g/dL (ref 3.5–5.0)
Alkaline Phosphatase: 70 U/L (ref 38–126)
Anion gap: 10 (ref 5–15)
BUN: 7 mg/dL — ABNORMAL LOW (ref 8–23)
CO2: 20 mmol/L — ABNORMAL LOW (ref 22–32)
Calcium: 10.8 mg/dL — ABNORMAL HIGH (ref 8.9–10.3)
Chloride: 105 mmol/L (ref 98–111)
Creatinine, Ser: 1 mg/dL (ref 0.44–1.00)
GFR, Estimated: >60 mL/min (ref >60)
Glucose, Bld: 109 mg/dL — ABNORMAL HIGH (ref 70–99)
Potassium: 4.4 mmol/L (ref 3.5–5.1)
Sodium: 135 mmol/L (ref 135–145)
Total Bilirubin: 1.2 mg/dL (ref 0.3–1.2)
Total Protein: 8.3 g/dL — ABNORMAL HIGH (ref 6.5–8.1)

## 2020-06-10 LAB — LIPASE, BLOOD: Lipase: 152 U/L — ABNORMAL HIGH (ref 11–51)

## 2020-06-10 MED ORDER — ONDANSETRON HCL 4 MG/2ML IJ SOLN
4.0000 mg | Freq: Once | INTRAMUSCULAR | Status: AC
  Administered 2020-06-10: 4 mg via INTRAVENOUS
  Filled 2020-06-10: qty 2

## 2020-06-10 MED ORDER — HYDROMORPHONE HCL 1 MG/ML IJ SOLN
1.0000 mg | Freq: Once | INTRAMUSCULAR | Status: AC
  Administered 2020-06-10: 1 mg via INTRAVENOUS
  Filled 2020-06-10: qty 1

## 2020-06-10 MED ORDER — IOHEXOL 300 MG/ML  SOLN
100.0000 mL | Freq: Once | INTRAMUSCULAR | Status: AC | PRN
  Administered 2020-06-10: 100 mL via INTRAVENOUS

## 2020-06-10 MED ORDER — SODIUM CHLORIDE 0.9 % IV BOLUS
1000.0000 mL | Freq: Once | INTRAVENOUS | Status: AC
  Administered 2020-06-10: 1000 mL via INTRAVENOUS

## 2020-06-10 MED ORDER — OXYCODONE HCL 5 MG PO TABS
5.0000 mg | ORAL_TABLET | ORAL | 0 refills | Status: DC | PRN
Start: 2020-06-10 — End: 2020-09-09

## 2020-06-10 MED ORDER — MORPHINE SULFATE (PF) 4 MG/ML IV SOLN
4.0000 mg | Freq: Once | INTRAVENOUS | Status: AC
  Administered 2020-06-10: 4 mg via INTRAVENOUS
  Filled 2020-06-10: qty 1

## 2020-06-10 NOTE — Discharge Instructions (Addendum)
Home to rest, liquid diet, advance slowly as tolerated. Follow up with your PCP and GI. Take Oxycodone for pain not controlled with your Norco at home. This medication will not be refilled from the ER, see your pain management provider for further pain control.

## 2020-06-10 NOTE — ED Notes (Signed)
Pt ambulatory to waiting room. Pt verbalized understanding of discharge instructions.   

## 2020-06-10 NOTE — ED Triage Notes (Addendum)
Pt has a history of Pancreatitis. Here for abdominal pain. Endorses N/V that started today. Pt has an EGD scheduled on Thursday.

## 2020-06-10 NOTE — ED Provider Notes (Signed)
[facility] EMERGENCY DEPARTMENT Provider Note   CSN: [id number] Arrival date & time: 06/10/20  0901     History Chief Complaint  Patient presents with  . Abdominal Pain    Colleen Lee is a 64 y.o. female.  64 year old female with history of cirrhosis, pancreatitis, cyclical vomiting, h. Pylori, presents with complaint of abdominal pain with nausea and vomiting. Patient reports onset of symptoms 3 days ago after eating spaghetti with red sauce. Vomiting has resolved with Zofran but continues to have pain. Patient is currently scheduled for endoscopy in 2 days. Denies fevers, chills, changes in bowel or bladder habits.         Past Medical History:  Diagnosis Date  . Asthma   . Chronic abdominal pain   . Chronic back pain   . Chronic constipation   . Cirrhosis ([place])    Metavir score F4 on elastography 2015  . Cyclical vomiting   . Fibroids   . Fibromyalgia   . GERD (gastroesophageal reflux disease)   . H. pylori infection 2014   treated with pylera, had to be treated again as it was not eradicated. Urea breath test then negative after subsequent treatment.   . [name]    HCV RNA positive 09/2012  . Hypertension   . Marijuana use   . Nausea and vomiting    chronic, recurrent  . PONV (postoperative nausea and vomiting)    pt thinks maybe once she had N&V  . Sciatica of left side     Patient Active Problem List   Diagnosis Date Noted  . Acute pancreatitis 10/17/2019  . ETOH abuse 10/17/2019  . S/P right rotator cuff repair 01/30/19 02/06/2019  . Nontraumatic complete tear of right rotator cuff   . Arthritis of right acromioclavicular joint   . S/P total knee replacement, 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/24/2018  . Shoulder impingement, right 05/24/2018  . Dehydration   . Intractable cyclical vomiting 56/31/4970  . Pyloric stenosis in adult   . Thrombocytopenia ([place]) 12/20/2017  . Leukocytosis   . Malnutrition of moderate degree 12/17/2017  . Acute renal failure (ARF)  ([place]) 12/15/2017  . Chronic abdominal pain 12/15/2017  . Gastroenteritis 06/14/2016  . Nausea with vomiting 06/10/2016  . Uterine enlargement 06/09/2016  . Intractable nausea and vomiting 06/08/2016  . Acute infective gastroenteritis 06/08/2016  . Diarrhea 06/08/2016  . Essential hypertension 06/08/2016  . GERD (gastroesophageal reflux disease) 06/08/2016  . Abdominal pain   . Endometrial polyp 04/15/2016  . PMB (postmenopausal bleeding) 02/27/2016  . Alcoholic cirrhosis of liver without ascites ([place])   . Asthma 01/21/2016  . Hepatic cirrhosis ([place]) 09/22/2015  . Arthritis of knee, degenerative 02/05/2015  . History of Helicobacter pylori infection 10/30/2014  . Chronic [name] with cirrhosis ([place]) 04/11/2014  . De Quervain's disease (radial styloid tenosynovitis) 12/25/2013  . Anorexia 11/21/2012  . FH: colon cancer 11/21/2012  . Early satiety 10/25/2012  . Bowel habit changes 10/25/2012  . Abdominal pain, epigastric 10/25/2012  . Abdominal bloating 10/25/2012  . Constipation 10/25/2012  . Abnormal weight loss 10/25/2012  . Chronic viral [name] ([place]) 10/25/2012  . Radicular pain of left lower extremity 09/28/2012  . Back pain 09/28/2012  . Sciatica 08/10/2011  . S/P arthroscopy of left knee 08/10/2011  . Tibial plateau fracture 08/10/2011  . Pain in joint, lower leg 02/12/2011  . Stiffness of joint, not elsewhere classified, lower leg 02/12/2011  . 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 Meniscus, medial, derangement 12/29/2010  .  CLOSED FRACTURE OF UPPER END OF TIBIA 08/12/2010    Past Surgical History:  Procedure Laterality Date  . BALLOON DILATION  12/20/2017   Procedure: BALLOON DILATION;  Surgeon: Danie Binder, MD;  Location: [facility];  Service: Endoscopy;;  pyloric dilation  . BIOPSY  12/20/2017   Procedure: BIOPSY;  Surgeon: Danie Binder, MD;  Location: [facility];  Service: Endoscopy;;  duodenal and gastric biopsy  . COLONOSCOPY  01/18/2008     DJS:HFWYOV rectum.  Long redundant colon, a diminutive sigmoid polyp status post cold biopsy removed. Hyperplastic polyp. Repeat colonoscopy June 2014 due to family history of colon cancer  . COLONOSCOPY WITH ESOPHAGOGASTRODUODENOSCOPY (EGD) N/A 11/02/2012   ZCH:YIFOYDXA gastric mucosa of doubtful, +H.pylori. Incomplete colonoscopy due to patient unable to tolerate exam, proximal colon seen. Patient refused ACBE.  Marland Kitchen COLONOSCOPY WITH PROPOFOL N/A 03/08/2013   Dr. Gala Romney: colonic polyp-removed as scribed above. Internal Hemorrhoids. Pathology did not reveal any colonic tissue, only mucus. SURVEILLANCE DUE Aug 2019  . COLONOSCOPY WITH PROPOFOL N/A 01/19/2018   Dr. Gala Romney: 6 mm cecal inflammatoy polyp, otherwise normal., Surveilance in 5 year s  . ESOPHAGEAL DILATION  12/20/2017   Procedure: ESOPHAGEAL DILATION;  Surgeon: Danie Binder, MD;  Location: [facility];  Service: Endoscopy;;  . ESOPHAGOGASTRODUODENOSCOPY  01/18/2008   RMR: Normal esophagus, normal  stomach  . ESOPHAGOGASTRODUODENOSCOPY (EGD) WITH PROPOFOL N/A 02/09/2016   Normal esophagus, small hiatal hernia, portal hypertensive gastropathy, normal second portion of duodenum. Due in July 2019  . ESOPHAGOGASTRODUODENOSCOPY (EGD) WITH PROPOFOL N/A 12/20/2017   Dr. Oneida Alar: benign appearing esophageal stenosis s/p dilation, mild pyloric stenosis s/p biopsy and dilation, mild duodenitis.   Marland Kitchen HYSTEROSCOPY  05/11/2016   Procedure: HYSTEROSCOPY;  Surgeon: Jonnie Kind, MD;  Location: [facility];  Service: Gynecology;;  . KNEE SURGERY     left knee  . MULTIPLE EXTRACTIONS WITH ALVEOLOPLASTY N/A 10/15/2013   Procedure: MULTIPLE EXTRACTION WITH ALVEOLOPLASTY;  Surgeon: Gae Bon, DDS;  Location: [place];  Service: Oral Surgery;  Laterality: N/A;  . POLYPECTOMY N/A 03/08/2013   Procedure: POLYPECTOMY;  Surgeon: Daneil Dolin, MD;  Location: [facility];  Service: Endoscopy;  Laterality: N/A;  . POLYPECTOMY N/A 05/11/2016   Procedure: REMOVAL OF  ENDOMETRIAL POLYP;  Surgeon: Jonnie Kind, MD;  Location: [facility];  Service: Gynecology;  Laterality: N/A;  . RESECTION DISTAL CLAVICAL Right 01/30/2019   Procedure: RESECTION DISTAL CLAVICAL;  Surgeon: Carole Civil, MD;  Location: [facility];  Service: Orthopedics;  Laterality: Right;  . SHOULDER OPEN ROTATOR CUFF REPAIR Right 01/30/2019   Procedure: ROTATOR CUFF REPAIR SHOULDER OPEN;  Surgeon: Carole Civil, MD;  Location: [facility];  Service: Orthopedics;  Laterality: Right;  pt to arrive at 7:30 for PICC at 8:00  . TOTAL KNEE ARTHROPLASTY Left 02/05/2015   Procedure: LEFT TOTAL KNEE ARTHROPLASTY;  Surgeon: Carole Civil, MD;  Location: [facility];  Service: Orthopedics;  Laterality: Left;     OB History    Gravida      Para      Term      Preterm      AB      Living  1     SAB      TAB      Ectopic      Multiple      Live Births              Family History  Problem Relation Age  of Onset  . Colon cancer Brother 41          . Multiple myeloma Brother   . Liver cancer Sister   . Prostate cancer Brother   . Pancreatic cancer Brother   . Cancer Mother        breast  . Asthma Mother     Social History   Tobacco Use  . Smoking status: Current Every Day Smoker    Packs/day: 1.00    Years: 40.00    Pack years: 40.00    Types: Cigarettes  . Smokeless tobacco: Never Used  . Tobacco comment: Smokes one pack of cigarettes every 1.5 days  Vaping Use  . Vaping Use: Never used  Substance Use Topics  . Alcohol use: Yes    Alcohol/week: 0.0 standard drinks    Comment: occ  . Drug use: Not Currently    Types: Marijuana    Comment: history of marijuana in past- almost 2 years ago    Home Medications Prior to Admission medications   Medication Sig Start Date End Date Taking? Authorizing Provider  albuterol (PROVENTIL HFA;VENTOLIN HFA) 108 (90 Base) MCG/ACT inhaler Inhale 1-2 puffs into the lungs every 6 (six) hours as needed for wheezing or shortness of  breath.     [provider]  budesonide-formoterol (SYMBICORT) 160-4.5 MCG/ACT inhaler Inhale 2 puffs into the lungs in the morning and at bedtime.     [provider]  Cyanocobalamin (B-12) 2500 MCG TABS Take 2,500 mcg by mouth daily.    [provider]  cyclobenzaprine (FLEXERIL) 5 MG tablet Take 1 tablet (5 mg total) by mouth 2 (two) times daily as needed for muscle spasms. 12/07/19   Carole Civil, MD  gabapentin (NEURONTIN) 100 MG capsule Take 1 capsule (100 mg total) by mouth 3 (three) times daily. Patient taking differently: Take 100 mg by mouth at bedtime.  12/22/18   Carole Civil, MD  hydrALAZINE (APRESOLINE) 50 MG tablet Take 1 tablet (50 mg total) by mouth every 8 (eight) hours. 01/30/18   Barton Dubois, MD  HYDROcodone-acetaminophen (NORCO/VICODIN) 5-325 MG tablet Take 1 tablet by mouth every 6 (six) hours as needed for moderate pain. 06/09/20   Carole Civil, MD  hydrOXYzine (ATARAX/VISTARIL) 10 MG tablet Take 10 mg by mouth daily.  11/30/17   [provider]  labetalol (NORMODYNE) 100 MG tablet Take 2 tablets (200 mg total) by mouth 2 (two) times daily. 01/30/18   Barton Dubois, MD  megestrol (MEGACE) 40 MG tablet Take 40 mg by mouth daily.    [provider]  ondansetron (ZOFRAN ODT) 4 MG disintegrating tablet Take 1 tablet (4 mg total) by mouth 3 (three) times daily. $RemoveBefo'4mg'[id number]$  ODT q4 hours prn nausea/vomit Patient taking differently: Take 4 mg by mouth as needed for nausea or vomiting.  12/22/17   Kathie Dike, MD  ondansetron (ZOFRAN) 4 MG tablet Take 4 mg by mouth every 6 (six) hours as needed for nausea. 12/28/19   [provider]  oxyCODONE (ROXICODONE) 5 MG immediate release tablet Take 1 tablet (5 mg total) by mouth every 4 (four) hours as needed for severe pain. 06/10/20   Tacy Learn, PA-C  pantoprazole (PROTONIX) 40 MG tablet Take 1 tablet (40 mg total) by mouth daily. 30 minutes before breakfast 09/07/19   Annitta Needs, NP  Plecanatide (TRULANCE) 3 MG TABS Take 3 mg by mouth daily. 09/07/19   Annitta Needs, NP    Allergies  Penicillins  Review of Systems   Review of Systems  Constitutional: Negative for chills and fever.  Respiratory: Negative for shortness of breath.   Cardiovascular: Negative for chest pain.  Gastrointestinal: Positive for abdominal pain, nausea and vomiting. Negative for constipation and diarrhea.  Genitourinary: Negative for dysuria.  Musculoskeletal: Negative for back pain.  Skin: Negative for rash and wound.  Neurological: Negative for weakness.  All other systems reviewed and are negative.   Physical Exam Updated Vital Signs BP (!) 188/107   Pulse 84   Temp 98.6 F (37 C) (Oral)   Resp 16   Ht $R'5\' 6"'nJ$  (1.676 m)   Wt 87.5 kg   SpO2 99%   BMI 31.15 kg/m   Physical Exam Vitals and nursing note reviewed.  Constitutional:      General: She is not in acute distress.    Appearance: She is well-developed. She is not diaphoretic.  HENT:     Head: Normocephalic and atraumatic.  Cardiovascular:     Rate and Rhythm: Normal rate and regular rhythm.     Heart sounds: Normal heart sounds.  Pulmonary:     Effort: Pulmonary effort is normal.     Breath sounds: Normal breath sounds.  Abdominal:     Palpations: Abdomen is soft.     Tenderness: There is generalized abdominal tenderness and tenderness in the right upper quadrant, left upper quadrant and left lower quadrant. There is guarding. There is no right CVA tenderness or left CVA tenderness.  Skin:    General: Skin is warm and dry.     Coloration: Skin is not jaundiced.     Findings: No rash.  Neurological:     Mental Status: She is alert and oriented to person, place, and time.  Psychiatric:        Behavior: Behavior normal.     ED Results / Procedures / Treatments   Labs (all labs ordered are listed, but only abnormal results are displayed) Labs Reviewed  COMPREHENSIVE METABOLIC PANEL - Abnormal;  Notable for the following components:      Result Value   CO2 20 (*)    Glucose, Bld 109 (*)    BUN 7 (*)    Calcium 10.8 (*)    Total Protein 8.3 (*)    All other components within normal limits  LIPASE, BLOOD - Abnormal; Notable for the following components:   Lipase 152 (*)    All other components within normal limits  CBC WITH DIFFERENTIAL/PLATELET    EKG None  Radiology CT Abdomen Pelvis W Contrast  Result Date: 06/10/2020 CLINICAL DATA:  Abdominal pain. History of pancreatitis. EXAM: CT ABDOMEN AND PELVIS WITH CONTRAST TECHNIQUE: Multidetector CT imaging of the abdomen and pelvis was performed using the standard protocol following bolus administration of intravenous contrast. CONTRAST:  163mL OMNIPAQUE IOHEXOL 300 MG/ML  SOLN COMPARISON:  CT scan 10/17/2019 FINDINGS: Lower chest: Minimal dependent subpleural atelectasis. No infiltrates, edema or effusions. The heart is within normal limits in size. No pericardial effusion. Mild tortuosity, ectasia and calcification of the thoracic aorta. Hepatobiliary: No hepatic lesions or intrahepatic biliary dilatation. The gallbladder is normal. No common bile duct dilatation. Pancreas: Moderate inflammation in and around the pancreas consistent with acute pancreatitis. No findings suspicious for pancreatic necrosis and no peripancreatic overt fluid collections. There is associated mild inflammation of the second and third portions of the duodenum. Spleen: Normal size. No focal lesions. Adrenals/Urinary Tract: The adrenal glands and kidneys are unremarkable. The bladder is normal. Stomach/Bowel:  The stomach, duodenum, small bowel and colon are grossly normal without oral contrast. No acute inflammatory changes other than mild inflammation from the patient's pancreatitis involving the duodenum. No mass lesions or obstructive findings. The terminal ileum and appendix are normal. Vascular/Lymphatic: Age advanced atherosclerotic calcifications involving the  aorta and branch vessels. No aneurysm or dissection. The major venous structures are patent. No mesenteric or retroperitoneal mass or adenopathy. Reproductive: The uterus demonstrates calcified uterine fibroids. The ovaries are unremarkable. Other: No pelvic mass or adenopathy. No free pelvic fluid collections. No inguinal mass or adenopathy. No abdominal wall hernia or subcutaneous lesions. Musculoskeletal: No significant bony findings. IMPRESSION: 1. CT findings consistent with acute pancreatitis. No complicating features are identified. 2. No other significant abdominal/pelvic findings, mass lesions or adenopathy. 3. Age advanced atherosclerotic calcifications involving the aorta and branch vessels. 4. Calcified uterine fibroids. 5. Aortic atherosclerosis. Aortic Atherosclerosis (ICD10-I70.0). Electronically Signed   By: Marijo Sanes M.D.   On: 06/10/2020 12:36    Procedures Procedures (including critical care time)  Medications Ordered in ED Medications  sodium chloride 0.9 % bolus 1,000 mL (0 mLs Intravenous Stopped 06/10/20 1136)  ondansetron (ZOFRAN) injection 4 mg (4 mg Intravenous Given 06/10/20 1044)  morphine 4 MG/ML injection 4 mg (4 mg Intravenous Given 06/10/20 1044)  morphine 4 MG/ML injection 4 mg (4 mg Intravenous Given 06/10/20 1135)  iohexol (OMNIPAQUE) 300 MG/ML solution 100 mL (100 mLs Intravenous Contrast Given 06/10/20 1153)  HYDROmorphone (DILAUDID) injection 1 mg (1 mg Intravenous Given 06/10/20 1344)    ED Course  I have reviewed the triage vital signs and the nursing notes.  Pertinent labs & imaging results that were available during my care of the patient were reviewed by me and considered in my medical decision making (see chart for details).  Clinical Course as of Jun 10 1352  Tue Jun 10, 2374  3350 64 year old female with history of pancreatitis presents with recurrent abdominal pain, described as similar episodes of pancreatitis.  Nausea and vomiting controlled with  Zofran at home, comes to the ER for pain control. On exam, patient is well-appearing, found to have generalized abdominal tenderness.  Labs reassuring, CBC with normal white blood cell count, CMP without significant changes.  Lipase is elevated at 152, consistent with prior labs. CT abdomen pelvis shows acute pancreatitis without complications, specifically no abscess or necrotic changes. Patient has had 2 doses of 4 mg morphine, continues to have pain, will give dose of Dilaudid and p.o. challenge, if pain is better controlled, may discharge home with a short supply of Norco.  Database reviewed, patient has received 5 days of Norco at a time, last filled 8 days ago.  Plan is for patient to follow-up with her GI in 2 days for endoscopy as scheduled.   [LM]  1353 Pain improved with Dilaudid.  Patient requesting Percocet, states that she has Vicodin at home that is not helping with her pain. Patient will be sent home with 6 tablets of oxycodone to take for pain not controlled with her baseline hydrocodone.  Patient was advised this medication will not be refilled in the emergency room and she will need to see her pain management provider for further care.   [LM]    Clinical Course User Index [LM] Roque Lias   MDM Rules/Calculators/A&P                          Final Clinical Impression(s) / ED Diagnoses  Final diagnoses:  Generalized abdominal pain  Acute pancreatitis, unspecified complication status, unspecified pancreatitis type    Rx / DC Orders ED Discharge Orders         Ordered    oxyCODONE (ROXICODONE) 5 MG immediate release tablet  Every 4 hours PRN        06/10/20 1352           Tacy Learn, PA-C 06/10/20 1353    Maudie Flakes, MD 06/10/20 1523

## 2020-06-11 ENCOUNTER — Other Ambulatory Visit (HOSPITAL_COMMUNITY)
Admission: RE | Admit: 2020-06-11 | Discharge: 2020-06-11 | Disposition: A | Discharge status: Home / Self Care | Payer: Medicare Other | Source: Ambulatory Visit | Attending: Internal Medicine | Admitting: Internal Medicine

## 2020-06-11 ENCOUNTER — Other Ambulatory Visit: Payer: Self-pay

## 2020-06-11 DIAGNOSIS — Z20822 Contact with and (suspected) exposure to covid-19: Secondary | ICD-10-CM | POA: Diagnosis not present

## 2020-06-11 DIAGNOSIS — Z01812 Encounter for preprocedural laboratory examination: Secondary | ICD-10-CM | POA: Insufficient documentation

## 2020-06-11 LAB — SARS CORONAVIRUS 2 (TAT 6-24 HRS): SARS Coronavirus 2: NEGATIVE

## 2020-06-12 ENCOUNTER — Ambulatory Visit (HOSPITAL_COMMUNITY): Payer: Medicare Other | Admitting: Anesthesiology

## 2020-06-12 ENCOUNTER — Encounter (HOSPITAL_COMMUNITY): Payer: Self-pay | Admitting: Internal Medicine

## 2020-06-12 ENCOUNTER — Encounter (HOSPITAL_COMMUNITY)
Admission: RE | Disposition: A | Discharge status: Home / Self Care | Payer: Self-pay | Source: Home / Self Care | Attending: Internal Medicine

## 2020-06-12 ENCOUNTER — Ambulatory Visit (HOSPITAL_COMMUNITY)
Admission: RE | Admit: 2020-06-12 | Discharge: 2020-06-12 | Disposition: A | Discharge status: Home / Self Care | Payer: Medicare Other | Attending: Internal Medicine | Admitting: Internal Medicine

## 2020-06-12 ENCOUNTER — Other Ambulatory Visit: Payer: Self-pay

## 2020-06-12 DIAGNOSIS — Z79899 Other long term (current) drug therapy: Secondary | ICD-10-CM | POA: Insufficient documentation

## 2020-06-12 DIAGNOSIS — K766 Portal hypertension: Secondary | ICD-10-CM | POA: Diagnosis not present

## 2020-06-12 DIAGNOSIS — K3189 Other diseases of stomach and duodenum: Secondary | ICD-10-CM | POA: Diagnosis not present

## 2020-06-12 DIAGNOSIS — K7460 Unspecified cirrhosis of liver: Secondary | ICD-10-CM | POA: Diagnosis not present

## 2020-06-12 HISTORY — PX: ESOPHAGOGASTRODUODENOSCOPY (EGD) WITH PROPOFOL: SHX5813

## 2020-06-12 SURGERY — ESOPHAGOGASTRODUODENOSCOPY (EGD) WITH PROPOFOL
Anesthesia: General

## 2020-06-12 MED ORDER — IPRATROPIUM-ALBUTEROL 0.5-2.5 (3) MG/3ML IN SOLN
3.0000 mL | Freq: Once | RESPIRATORY_TRACT | Status: AC
  Administered 2020-06-12: 3 mL via RESPIRATORY_TRACT

## 2020-06-12 MED ORDER — LIDOCAINE VISCOUS HCL 2 % MT SOLN
OROMUCOSAL | Status: AC
  Administered 2020-06-12: 15 mL via OROMUCOSAL
  Filled 2020-06-12: qty 15

## 2020-06-12 MED ORDER — PROPOFOL 10 MG/ML IV BOLUS
INTRAVENOUS | Status: DC | PRN
  Administered 2020-06-12: 80 mg via INTRAVENOUS
  Administered 2020-06-12: 150 ug/kg/min via INTRAVENOUS

## 2020-06-12 MED ORDER — LIDOCAINE HCL (CARDIAC) PF 100 MG/5ML IV SOSY
PREFILLED_SYRINGE | INTRAVENOUS | Status: DC | PRN
  Administered 2020-06-12: 40 mg via INTRAVENOUS

## 2020-06-12 MED ORDER — CHLORHEXIDINE GLUCONATE CLOTH 2 % EX PADS: 6.0000 | MEDICATED_PAD | Freq: Once | CUTANEOUS | Status: DC

## 2020-06-12 MED ORDER — LIDOCAINE VISCOUS HCL 2 % MT SOLN: 15.0000 mL | Freq: Once | OROMUCOSAL | Status: AC

## 2020-06-12 MED ORDER — LACTATED RINGERS IV SOLN: INTRAVENOUS | Status: DC | PRN

## 2020-06-12 MED ORDER — STERILE WATER FOR IRRIGATION IR SOLN
Status: DC | PRN
  Administered 2020-06-12: 100 mL

## 2020-06-12 MED ORDER — IPRATROPIUM-ALBUTEROL 0.5-2.5 (3) MG/3ML IN SOLN
RESPIRATORY_TRACT | Status: AC
  Filled 2020-06-12: qty 3

## 2020-06-12 MED ORDER — FENTANYL CITRATE (PF) 100 MCG/2ML IJ SOLN
INTRAMUSCULAR | Status: DC | PRN
Start: 2020-06-12 — End: 2020-06-12
  Administered 2020-06-12 (×2): 50 ug via INTRAVENOUS

## 2020-06-12 MED ORDER — LACTATED RINGERS IV SOLN: Freq: Once | INTRAVENOUS | Status: AC

## 2020-06-12 MED ORDER — FENTANYL CITRATE (PF) 100 MCG/2ML IJ SOLN
INTRAMUSCULAR | Status: AC
  Filled 2020-06-12: qty 2

## 2020-06-12 NOTE — Anesthesia Postprocedure Evaluation (Signed)
Anesthesia Post Note  Patient: Colleen Lee  Procedure(s) Performed: ESOPHAGOGASTRODUODENOSCOPY (EGD) WITH PROPOFOL (N/A )  Patient location during evaluation: Endoscopy Anesthesia Type: General Level of consciousness: awake, oriented, awake and alert and patient cooperative Pain management: pain level controlled Vital Signs Assessment: post-procedure vital signs reviewed and stable Respiratory status: spontaneous breathing, respiratory function stable and nonlabored ventilation Cardiovascular status: blood pressure returned to baseline and stable Postop Assessment: no headache and no backache Anesthetic complications: no   No complications documented.   Last Vitals:  Vitals:   06/12/20 0730 06/12/20 0944  BP: (!) 152/94 109/71  Pulse: (!) 110 (!) 107  Resp: (!) 24 20  Temp: 37.1 C 37 C  SpO2: 96% 96%    Last Pain:  Vitals:   06/12/20 0944  TempSrc: Oral  PainSc:                  Tacy Learn

## 2020-06-12 NOTE — H&P (Signed)
$'@LOGO'U$ @   Primary Care Physician:  Rosita Fire, MD Primary Gastroenterologist:  Dr. Gala Romney  Pre-Procedure History & Physical: HPI:  Colleen Lee is a 64 y.o. female here for screening EGD.  Longstanding alcohol.  Increased fibrosis in the liver.  Green for esophageal varices. Esophageal stenosis dilated 2019.  Biopsies negative for H. pylori.  Past Medical History:  Diagnosis Date  . Asthma   . Chronic abdominal pain   . Chronic back pain   . Chronic constipation   . Cirrhosis ([place])    Metavir score F4 on elastography 2015  . Cyclical vomiting   . Fibroids   . Fibromyalgia   . GERD (gastroesophageal reflux disease)   . H. pylori infection 2014   treated with pylera, had to be treated again as it was not eradicated. Urea breath test then negative after subsequent treatment.   . [name]    HCV RNA positive 09/2012  . Hypertension   . Marijuana use   . Nausea and vomiting    chronic, recurrent  . PONV (postoperative nausea and vomiting)    pt thinks maybe once she had N&V  . Sciatica of left side     Past Surgical History:  Procedure Laterality Date  . BALLOON DILATION  12/20/2017   Procedure: BALLOON DILATION;  Surgeon: Danie Binder, MD;  [facility];  Service: Endoscopy;;  pyloric dilation  . BIOPSY  12/20/2017   Procedure: BIOPSY;  Surgeon: Danie Binder, MD;  [facility];  Service: Endoscopy;;  duodenal and gastric biopsy  . COLONOSCOPY  01/18/2008   KVQ:QVZDGL rectum.  Long redundant colon, a diminutive sigmoid polyp status post cold biopsy removed. Hyperplastic polyp. Repeat colonoscopy June 2014 due to family history of colon cancer  . COLONOSCOPY WITH ESOPHAGOGASTRODUODENOSCOPY (EGD) N/A 11/02/2012   OVF:IEPPIRJJ gastric mucosa of doubtful, +H.pylori. Incomplete colonoscopy due to patient unable to tolerate exam, proximal colon seen. Patient refused ACBE.  Marland Kitchen COLONOSCOPY WITH PROPOFOL N/A 03/08/2013   Dr. Gala Romney: colonic polyp-removed as  scribed above. Internal Hemorrhoids. Pathology did not reveal any colonic tissue, only mucus. SURVEILLANCE DUE Aug 2019  . COLONOSCOPY WITH PROPOFOL N/A 01/19/2018   Dr. Gala Romney: 6 mm cecal inflammatoy polyp, otherwise normal., Surveilance in 5 year s  . ESOPHAGEAL DILATION  12/20/2017   Procedure: ESOPHAGEAL DILATION;  Surgeon: Danie Binder, MD;  [facility];  Service: Endoscopy;;  . ESOPHAGOGASTRODUODENOSCOPY  01/18/2008   RMR: Normal esophagus, normal  stomach  . ESOPHAGOGASTRODUODENOSCOPY (EGD) WITH PROPOFOL N/A 02/09/2016   Normal esophagus, small hiatal hernia, portal hypertensive gastropathy, normal second portion of duodenum. Due in July 2019  . ESOPHAGOGASTRODUODENOSCOPY (EGD) WITH PROPOFOL N/A 12/20/2017   Dr. Oneida Alar: benign appearing esophageal stenosis s/p dilation, mild pyloric stenosis s/p biopsy and dilation, mild duodenitis.   Marland Kitchen HYSTEROSCOPY  05/11/2016   Procedure: HYSTEROSCOPY;  Surgeon: Jonnie Kind, MD;  Location: [facility];  Service: Gynecology;;  . KNEE SURGERY     left knee  . MULTIPLE EXTRACTIONS WITH ALVEOLOPLASTY N/A 10/15/2013   Procedure: MULTIPLE EXTRACTION WITH ALVEOLOPLASTY;  Surgeon: Gae Bon, DDS;  Location: [place];  Service: Oral Surgery;  Laterality: N/A;  . POLYPECTOMY N/A 03/08/2013   Procedure: POLYPECTOMY;  Surgeon: Daneil Dolin, MD;  Location: [facility];  Service: Endoscopy;  Laterality: N/A;  . POLYPECTOMY N/A 05/11/2016   Procedure: REMOVAL OF ENDOMETRIAL POLYP;  Surgeon: Jonnie Kind, MD;  Location: [facility];  Service: Gynecology;  Laterality: N/A;  .  RESECTION DISTAL CLAVICAL Right 01/30/2019   Procedure: RESECTION DISTAL CLAVICAL;  Surgeon: Carole Civil, MD;  Location: [facility];  Service: Orthopedics;  Laterality: Right;  . SHOULDER OPEN ROTATOR CUFF REPAIR Right 01/30/2019   Procedure: ROTATOR CUFF REPAIR SHOULDER OPEN;  Surgeon: Carole Civil, MD;  Location: [facility];  Service: Orthopedics;  Laterality: Right;  pt to arrive at  7:30 for PICC at 8:00  . TOTAL KNEE ARTHROPLASTY Left 02/05/2015   Procedure: LEFT TOTAL KNEE ARTHROPLASTY;  Surgeon: Carole Civil, MD;  Location: [facility];  Service: Orthopedics;  Laterality: Left;    Prior to Admission medications   Medication Sig Start Date End Date Taking? Authorizing Provider  albuterol (PROVENTIL HFA;VENTOLIN HFA) 108 (90 Base) MCG/ACT inhaler Inhale 2 puffs into the lungs every 6 (six) hours as needed for wheezing or shortness of breath.    Yes [provider]  Cyanocobalamin (B-12) 2500 MCG TABS Take 2,500 mcg by mouth daily.   Yes [provider]  cyclobenzaprine (FLEXERIL) 5 MG tablet Take 1 tablet (5 mg total) by mouth 2 (two) times daily as needed for muscle spasms. Patient taking differently: Take 5 mg by mouth daily.  12/07/19  Yes Carole Civil, MD  gabapentin (NEURONTIN) 100 MG capsule Take 1 capsule (100 mg total) by mouth 3 (three) times daily. Patient taking differently: Take 100 mg by mouth at bedtime.  12/22/18  Yes Carole Civil, MD  hydrALAZINE (APRESOLINE) 50 MG tablet Take 1 tablet (50 mg total) by mouth every 8 (eight) hours. 01/30/18  Yes Barton Dubois, MD  hydrOXYzine (ATARAX/VISTARIL) 10 MG tablet Take 10 mg by mouth daily.  11/30/17  Yes [provider]  labetalol (NORMODYNE) 100 MG tablet Take 2 tablets (200 mg total) by mouth 2 (two) times daily. Patient taking differently: Take 100 mg by mouth 2 (two) times daily.  01/30/18  Yes Barton Dubois, MD  megestrol (MEGACE) 40 MG tablet Take 40 mg by mouth daily.   Yes [provider]  ondansetron (ZOFRAN) 4 MG tablet Take 4 mg by mouth every 6 (six) hours as needed for nausea. 12/28/19  Yes [provider]  oxyCODONE (ROXICODONE) 5 MG immediate release tablet Take 1 tablet (5 mg total) by mouth every 4 (four) hours as needed for severe pain. 06/10/20  Yes Tacy Learn, PA-C  pantoprazole (PROTONIX) 40 MG tablet Take 1 tablet (40 mg total) by mouth  daily. 30 minutes before breakfast 09/07/19  Yes Annitta Needs, NP  Plecanatide (TRULANCE) 3 MG TABS Take 3 mg by mouth daily. 09/07/19  Yes Annitta Needs, NP  potassium chloride (MICRO-K) 10 MEQ CR capsule Take 20 mEq by mouth daily.   Yes [provider]  HYDROcodone-acetaminophen (NORCO/VICODIN) 5-325 MG tablet Take 1 tablet by mouth every 6 (six) hours as needed for moderate pain. 06/09/20   Carole Civil, MD    Allergies as of 05/13/2020 - Review Complete 05/13/2020  Allergen Reaction Noted  . Penicillins Rash     Family History  Problem Relation Age of Onset  . Colon cancer Brother 44          . Multiple myeloma Brother   . Liver cancer Sister   . Prostate cancer Brother   . Pancreatic cancer Brother   . Cancer Mother        breast  . Asthma Mother     Social History   Socioeconomic History  . Marital status: Married    Spouse  name: Not on file  . Number of children: 0  . Years of education: Not on file  . Highest education level: Not on file  Occupational History  . Occupation: umemployed   Tobacco Use  . Smoking status: Current Every Day Smoker    Packs/day: 1.00    Years: 40.00    Pack years: 40.00    Types: Cigarettes  . Smokeless tobacco: Never Used  . Tobacco comment: Smokes one pack of cigarettes every 1.5 days  Vaping Use  . Vaping Use: Never used  Substance and Sexual Activity  . Alcohol use: Yes    Alcohol/week: 0.0 standard drinks    Comment: occ  . Drug use: Not Currently    Types: Marijuana    Comment: history of marijuana in past- almost 2 years ago  . Sexual activity: Never    Birth control/protection: None  Other Topics Concern  . Not on file  Social History Narrative  . Not on file   Social Determinants of Health   Financial Resource Strain:   . Difficulty of Paying Living Expenses: Not on file  Food Insecurity:   . Worried About Charity fundraiser in the Last Year: Not on file  . Ran Out of Food in the Last Year: Not  on file  Transportation Needs:   . Lack of Transportation (Medical): Not on file  . Lack of Transportation (Non-Medical): Not on file  Physical Activity:   . Days of Exercise per Week: Not on file  . Minutes of Exercise per Session: Not on file  Stress:   . Feeling of Stress : Not on file  Social Connections:   . Frequency of Communication with Friends and Family: Not on file  . Frequency of Social Gatherings with Friends and Family: Not on file  . Attends Religious Services: Not on file  . Active Member of Clubs or Organizations: Not on file  . Attends Archivist Meetings: Not on file  . Marital Status: Not on file  Intimate Partner Violence:   . Fear of Current or Ex-Partner: Not on file  . Emotionally Abused: Not on file  . Physically Abused: Not on file  . Sexually Abused: Not on file    Review of Systems: See HPI, otherwise negative ROS  Physical Exam: BP (!) 152/94   Pulse (!) 110   Temp 98.7 F (37.1 C) (Oral)   Resp (!) 24   Ht $R'5\' 6"'eh$  (1.676 m)   Wt 87.5 kg   SpO2 96%   BMI 31.15 kg/m  General:   Alert,  Well-developed, well-nourished, pleasant and cooperative in NAD Neck:  Supple; no masses or thyromegaly. No significant cervical adenopathy. Lungs:  Clear throughout to auscultation.   No wheezes, crackles, or rhonchi. No acute distress. Heart:  Regular rate and rhythm; no murmurs, clicks, rubs,  or gallops. Abdomen: Non-distended, normal bowel sounds.  Soft and nontender without appreciable mass or hepatosplenomegaly.  Pulses:  Normal pulses noted. Extremities:  Without clubbing or edema.  Impression/Plan: 64 year old lady with longstanding alcohol abuse hepatic fibrosis here for screening EGD.  Denies dysphagia since having her esophagus stretched previously. The risks, benefits, limitations, alternatives and imponderables have been reviewed with the patient. Potential for esophageal dilation, biopsy, etc. have also been reviewed.  Questions have been  answered. All parties agreeable.     Notice: This dictation was prepared with Dragon dictation along with smaller phrase technology. Any transcriptional errors that result from this process are unintentional and  may not be corrected upon review.

## 2020-06-12 NOTE — Op Note (Signed)
[facility] Patient Name: Colleen Lee Procedure Date: 06/12/2020 9:27 AM MRN: [id number] Date of Birth: 23-Feb-1956 Attending MD: Gennette Pac , MD CSN: [id number] Age: 64 Admit Type: Outpatient Procedure:                Upper GI endoscopy Indications:              Cirrhosis with suspected esophageal varices Providers:                Gennette Pac, MD, Sheran Fava,                            Pandora Leiter, Technician Referring MD:              Medicines:                Propofol per Anesthesia Complications:            No immediate complications. Estimated Blood Loss:     Estimated blood loss: none. Procedure:                Pre-Anesthesia Assessment:                           - Prior to the procedure, a History and Physical                            was performed, and patient medications and                            allergies were reviewed. The patient's tolerance of                            previous anesthesia was also reviewed. The risks                            and benefits of the procedure and the sedation                            options and risks were discussed with the patient.                            All questions were answered, and informed consent                            was obtained. Prior Anticoagulants: The patient has                            taken no previous anticoagulant or antiplatelet                            agents. ASA Grade Assessment: III - A patient with                            severe systemic disease. After reviewing the risks  and benefits, the patient was deemed in                            satisfactory condition to undergo the procedure.                           After obtaining informed consent, the endoscope was                            passed under direct vision. Throughout the                            procedure, the patient's blood pressure, pulse, and                             oxygen saturations were monitored continuously. The                            [id number]-[id number] ([id number]) scope was introduced through the                            mouth, and advanced to the second part of duodenum.                            The upper GI endoscopy was accomplished without                            difficulty. The patient tolerated the procedure                            well. Scope In: 9:37:08 AM Scope Out: 9:40:37 AM Total Procedure Duration: 0 hours 3 minutes 29 seconds  Findings:      The examined esophagus was normal.      Mild portal hypertensive gastropathy was found in the entire examined       stomach.      The duodenal bulb and second portion of the duodenum were normal. Impression:               - Normal esophagus.                           - Portal hypertensive gastropathy.                           - Normal duodenal bulb and second portion of the                            duodenum.                           - No specimens collected. Office visit with [place] in 3                            months. Tentatively, plan for repeat EGD in 2 years. Moderate Sedation:      Moderate (conscious) sedation was  personally administered by an       anesthesia professional. The following parameters were monitored: oxygen       saturation, heart rate, blood pressure, respiratory rate, EKG, adequacy       of pulmonary ventilation, and response to care. Recommendation:           - Patient has a contact number available for                            emergencies. The signs and symptoms of potential                            delayed complications were discussed with the                            patient. Return to normal activities tomorrow.                            Written discharge instructions were provided to the                            patient.                           - Advance diet as tolerated. Procedure Code(s):        --- Professional ---                            [phone], Esophagogastroduodenoscopy, flexible,                            transoral; diagnostic, including collection of                            specimen(s) by brushing or washing, when performed                            (separate procedure) Diagnosis Code(s):        --- Professional ---                           K76.6, Portal hypertension                           K31.89, Other diseases of stomach and duodenum                           K74.60, Unspecified cirrhosis of liver CPT copyright 2019 American Medical Association. All rights reserved. The codes documented in this report are preliminary and upon coder review may  be revised to meet current compliance requirements. Gerrit Friends. Dontrell Stuck, MD Gennette Pac, MD 06/12/2020 9:47:54 AM This report has been signed electronically. Number of Addenda: 0

## 2020-06-12 NOTE — Discharge Instructions (Signed)
EGD Discharge instructions Please read the instructions outlined below and refer to this sheet in the next few weeks. These discharge instructions provide you with general information on caring for yourself after you leave the hospital. Your doctor may also give you specific instructions. While your treatment has been planned according to the most current medical practices available, unavoidable complications occasionally occur. If you have any problems or questions after discharge, please call your doctor. ACTIVITY  You may resume your regular activity but move at a slower pace for the next 24 hours.   Take frequent rest periods for the next 24 hours.   Walking will help expel (get rid of) the air and reduce the bloated feeling in your abdomen.   No driving for 24 hours (because of the anesthesia (medicine) used during the test).   You may shower.   Do not sign any important legal documents or operate any machinery for 24 hours (because of the anesthesia used during the test).  NUTRITION  Drink plenty of fluids.   You may resume your normal diet.   Begin with a light meal and progress to your normal diet.   Avoid alcoholic beverages for 24 hours or as instructed by your caregiver.  MEDICATIONS  You may resume your normal medications unless your caregiver tells you otherwise.  WHAT YOU CAN EXPECT TODAY  You may experience abdominal discomfort such as a feeling of fullness or "gas" pains.  FOLLOW-UP  Your doctor will discuss the results of your test with you.  SEEK IMMEDIATE MEDICAL ATTENTION IF ANY OF THE FOLLOWING OCCUR:  Excessive nausea (feeling sick to your stomach) and/or vomiting.   Severe abdominal pain and distention (swelling).   Trouble swallowing.   Temperature over 101 F (37.8 C).   Rectal bleeding or vomiting of blood.   Office visit with Roseanne Kaufman in 3 months  No varices found today which is good news  At patient [place], I called Chestine Spore at  [phone]  -Left message on answering service.

## 2020-06-12 NOTE — Transfer of Care (Signed)
Immediate Anesthesia Transfer of Care Note  Patient: Colleen Lee  Procedure(s) Performed: ESOPHAGOGASTRODUODENOSCOPY (EGD) WITH PROPOFOL (N/A )  Patient Location: Endoscopy Unit  Anesthesia Type:General  Level of Consciousness: awake, alert , oriented and patient cooperative  Airway & Oxygen Therapy: Patient Spontanous Breathing  Post-op Assessment: Report given to RN, Post -op Vital signs reviewed and stable and Patient moving all extremities  Post vital signs: Reviewed and stable  Last Vitals:  Vitals Value Taken Time  BP    Temp    Pulse    Resp    SpO2      Last Pain:  Vitals:   06/12/20 0730  TempSrc: Oral  PainSc: 4       Patients Stated Pain Goal: 9 (97/94/99 7182)  Complications: No complications documented.

## 2020-06-12 NOTE — Anesthesia Preprocedure Evaluation (Addendum)
Anesthesia Evaluation  Patient identified by MRN, date of birth, ID band Patient awake    Reviewed: Allergy & Precautions, NPO status , [name] Chart, lab work & pertinent test results  History of Anesthesia Complications (+) PONV and history of anesthetic complications  Airway Mallampati: II  TM Distance: >3 FB Neck ROM: Full    Dental  (+) Upper Dentures, Lower Dentures   Pulmonary asthma , Current SmokerPatient did not abstain from smoking.,     + wheezing      Cardiovascular hypertension, Normal cardiovascular exam Rhythm:Regular Rate:Normal     Neuro/Psych PSYCHIATRIC DISORDERS  Neuromuscular disease    GI/Hepatic GERD  Medicated,(+) Cirrhosis     substance abuse  alcohol use, Hepatitis -, C  Endo/Other    Renal/GU Renal disease     Musculoskeletal  (+) Arthritis , Fibromyalgia -  Abdominal   Peds  Hematology  (+) Blood dyscrasia (thrombocytopenia ), ,   Anesthesia Other Findings   Reproductive/Obstetrics                            Anesthesia Physical Anesthesia Plan  ASA: III  Anesthesia Plan: General   Post-op Pain Management:    Induction:   PONV Risk Score and Plan: TIVA  Airway Management Planned: Nasal Cannula, Natural Airway and Simple Face Mask  Additional Equipment:   Intra-op Plan:   Post-operative Plan:   Informed Consent: I have reviewed the patients History and Physical, chart, labs and discussed the procedure including the risks, benefits and alternatives for the proposed anesthesia with the patient or authorized representative who has indicated his/her understanding and acceptance.     Dental advisory given  Plan Discussed with: CRNA and Surgeon  Anesthesia Plan Comments: (Will give duoneb treatment before the procedure)       Anesthesia Quick Evaluation

## 2020-06-16 ENCOUNTER — Other Ambulatory Visit: Payer: Self-pay | Admitting: Orthopedic Surgery

## 2020-06-16 MED ORDER — HYDROCODONE-ACETAMINOPHEN 5-325 MG PO TABS: 1.0000 | ORAL_TABLET | Freq: Four times a day (QID) | ORAL | 0 refills | Status: DC | PRN

## 2020-06-16 NOTE — Telephone Encounter (Signed)
  Patient called to request refill - medication not on current list  - states it is HYDROcodone-acetaminophen (NORCO) 5-325 MG per tablet    - [facility]

## 2020-06-18 ENCOUNTER — Encounter (HOSPITAL_COMMUNITY): Payer: Self-pay | Admitting: Internal Medicine

## 2020-06-23 ENCOUNTER — Other Ambulatory Visit: Payer: Self-pay | Admitting: Orthopedic Surgery

## 2020-06-23 NOTE — Telephone Encounter (Signed)
Patient request refill on  HYDROcodone-Acetaminophen (Tab) NORCO/VICODIN 5-325 MG Take 1 tablet by mouth every 6 (six) hours as needed for moderate pain.     Pharmacy:  [facility]

## 2020-06-24 MED ORDER — HYDROCODONE-ACETAMINOPHEN 5-325 MG PO TABS: 1.0000 | ORAL_TABLET | Freq: Four times a day (QID) | ORAL | 0 refills | Status: DC | PRN

## 2020-06-29 DIAGNOSIS — K219 Gastro-esophageal reflux disease without esophagitis: Secondary | ICD-10-CM | POA: Diagnosis not present

## 2020-06-29 DIAGNOSIS — I10 Essential (primary) hypertension: Secondary | ICD-10-CM | POA: Diagnosis not present

## 2020-06-30 ENCOUNTER — Other Ambulatory Visit: Payer: Self-pay | Admitting: Orthopedic Surgery

## 2020-06-30 MED ORDER — HYDROCODONE-ACETAMINOPHEN 5-325 MG PO TABS: 1.0000 | ORAL_TABLET | Freq: Four times a day (QID) | ORAL | 0 refills | Status: DC | PRN

## 2020-06-30 NOTE — Telephone Encounter (Signed)
Patient requests refill on Hydrocodone/Acetaminophen 5-325 mgs.  Qty 21  Sig: Take 1 tablet by mouth every 6 (six) hours as needed for moderate pain.  Patient states she uses [facility]

## 2020-07-01 ENCOUNTER — Other Ambulatory Visit: Payer: Self-pay | Admitting: Gastroenterology

## 2020-07-07 ENCOUNTER — Other Ambulatory Visit: Payer: Self-pay | Admitting: Orthopedic Surgery

## 2020-07-07 MED ORDER — HYDROCODONE-ACETAMINOPHEN 5-325 MG PO TABS: 1.0000 | ORAL_TABLET | Freq: Four times a day (QID) | ORAL | 0 refills | Status: DC | PRN

## 2020-07-07 NOTE — Telephone Encounter (Signed)
Patient requests refill on Hydrocodone/Acetaminophen 5-325 mgs.  Qty 21   Sig: Take 1 tablet by mouth every 8 (eight) hours as needed for up to 7 days for moderate pain. TAKE 1 TABLET BY MOUTH EVERY 6 HOURS AS NEEDED FOR UP TO 7 DAYS FOR MODERATE PAIN.  Patient states she uses [facility]

## 2020-07-14 ENCOUNTER — Other Ambulatory Visit: Payer: Self-pay | Admitting: Orthopedic Surgery

## 2020-07-14 MED ORDER — HYDROCODONE-ACETAMINOPHEN 5-325 MG PO TABS: 1.0000 | ORAL_TABLET | Freq: Four times a day (QID) | ORAL | 0 refills | Status: DC | PRN

## 2020-07-14 NOTE — Telephone Encounter (Signed)
Patient called for refill:  HYDROcodone-acetaminophen (NORCO/VICODIN) 5-325 MG tablet 21 tablet  -[facility]

## 2020-07-21 ENCOUNTER — Other Ambulatory Visit: Payer: Self-pay

## 2020-07-21 MED ORDER — HYDROCODONE-ACETAMINOPHEN 5-325 MG PO TABS: 1.0000 | ORAL_TABLET | Freq: Four times a day (QID) | ORAL | 0 refills | Status: DC | PRN

## 2020-07-21 NOTE — Telephone Encounter (Signed)
She needs pain management

## 2020-07-28 ENCOUNTER — Other Ambulatory Visit: Payer: Self-pay | Admitting: Orthopedic Surgery

## 2020-07-28 MED ORDER — HYDROCODONE-ACETAMINOPHEN 5-325 MG PO TABS: 1.0000 | ORAL_TABLET | Freq: Four times a day (QID) | ORAL | 0 refills | Status: DC | PRN

## 2020-07-28 NOTE — Telephone Encounter (Signed)
Hydrocodone/Acetaminophen 5-325 mgs.  Qty  21  Sig: Take 1 tablet by mouth every 6 (six) hours as needed for moderate pain.  Patient states she uses [facility] 

## 2020-07-29 DIAGNOSIS — E785 Hyperlipidemia, unspecified: Secondary | ICD-10-CM | POA: Diagnosis not present

## 2020-07-29 DIAGNOSIS — I10 Essential (primary) hypertension: Secondary | ICD-10-CM | POA: Diagnosis not present

## 2020-08-04 ENCOUNTER — Other Ambulatory Visit: Payer: Self-pay | Admitting: Orthopedic Surgery

## 2020-08-04 MED ORDER — HYDROCODONE-ACETAMINOPHEN 5-325 MG PO TABS: 1.0000 | ORAL_TABLET | Freq: Four times a day (QID) | ORAL | 0 refills | Status: DC | PRN

## 2020-08-04 NOTE — Telephone Encounter (Signed)
Hydrocodone/Acetaminophen 5-325 mgs.  Qty  21  Sig: Take 1 tablet by mouth every 6 (six) hours as needed for moderate pain.  Patient states she uses [facility]

## 2020-08-18 ENCOUNTER — Other Ambulatory Visit: Payer: Self-pay | Admitting: Orthopedic Surgery

## 2020-08-18 NOTE — Telephone Encounter (Signed)
Rx request 

## 2020-08-25 ENCOUNTER — Other Ambulatory Visit: Payer: Self-pay | Admitting: Orthopedic Surgery

## 2020-08-25 NOTE — Telephone Encounter (Signed)
Rx request 

## 2020-08-29 DIAGNOSIS — K869 Disease of pancreas, unspecified: Secondary | ICD-10-CM | POA: Diagnosis not present

## 2020-08-29 DIAGNOSIS — I10 Essential (primary) hypertension: Secondary | ICD-10-CM | POA: Diagnosis not present

## 2020-09-01 ENCOUNTER — Other Ambulatory Visit: Payer: Self-pay | Admitting: Orthopedic Surgery

## 2020-09-01 NOTE — Telephone Encounter (Signed)
Rx request 

## 2020-09-01 NOTE — Telephone Encounter (Signed)
Patient requests prescription for Hydrocodone/Acetaminphen 5-325 mgs.  Qty  21  Sig: TAKE 1 TABLET BY MOUTH EVERY 6 HOURS AS NEEDED FOR MODERATE PAIN.  Patient uses [facility]

## 2020-09-02 ENCOUNTER — Telehealth: Payer: Self-pay | Admitting: Radiology

## 2020-09-02 DIAGNOSIS — M542 Cervicalgia: Secondary | ICD-10-CM

## 2020-09-02 DIAGNOSIS — Z9889 Other specified postprocedural states: Secondary | ICD-10-CM

## 2020-09-02 NOTE — Telephone Encounter (Signed)
Patient stopped by wants to know why her hydrocodone Rx was denied.

## 2020-09-03 NOTE — Telephone Encounter (Signed)
She stopped by and Colleen Lee advised her Dr Aline Brochure wants to refer to pain management she agrees to referral, told her she will get a call to schedule

## 2020-09-03 NOTE — Telephone Encounter (Signed)
Mail box is full  Unable to reach  If she calls back will see if she agrees to referral and will send.

## 2020-09-03 NOTE — Telephone Encounter (Signed)
Refer to pain management , did this before she will not go

## 2020-09-09 ENCOUNTER — Other Ambulatory Visit: Payer: Self-pay

## 2020-09-09 ENCOUNTER — Emergency Department (HOSPITAL_COMMUNITY): Payer: Medicare Other

## 2020-09-09 ENCOUNTER — Emergency Department (HOSPITAL_COMMUNITY)
Admission: EM | Admit: 2020-09-09 | Discharge: 2020-09-09 | Disposition: A | Discharge status: Home / Self Care | Payer: Medicare Other | Attending: Emergency Medicine | Admitting: Emergency Medicine

## 2020-09-09 ENCOUNTER — Encounter (HOSPITAL_COMMUNITY): Payer: Self-pay | Admitting: *Deleted

## 2020-09-09 DIAGNOSIS — R1010 Upper abdominal pain, unspecified: Secondary | ICD-10-CM | POA: Diagnosis present

## 2020-09-09 DIAGNOSIS — J45909 Unspecified asthma, uncomplicated: Secondary | ICD-10-CM | POA: Diagnosis not present

## 2020-09-09 DIAGNOSIS — R000 Tachycardia, unspecified: Secondary | ICD-10-CM | POA: Diagnosis not present

## 2020-09-09 DIAGNOSIS — F17210 Nicotine dependence, cigarettes, uncomplicated: Secondary | ICD-10-CM | POA: Insufficient documentation

## 2020-09-09 DIAGNOSIS — I10 Essential (primary) hypertension: Secondary | ICD-10-CM | POA: Insufficient documentation

## 2020-09-09 DIAGNOSIS — Z96652 Presence of left artificial knee joint: Secondary | ICD-10-CM | POA: Diagnosis not present

## 2020-09-09 DIAGNOSIS — K859 Acute pancreatitis without necrosis or infection, unspecified: Secondary | ICD-10-CM | POA: Insufficient documentation

## 2020-09-09 DIAGNOSIS — Z79899 Other long term (current) drug therapy: Secondary | ICD-10-CM | POA: Diagnosis not present

## 2020-09-09 DIAGNOSIS — R1110 Vomiting, unspecified: Secondary | ICD-10-CM | POA: Diagnosis not present

## 2020-09-09 LAB — CBC
HCT: 41 % (ref 36.0–46.0)
Hemoglobin: 13.5 g/dL (ref 12.0–15.0)
MCH: 32 pg (ref 26.0–34.0)
MCHC: 32.9 g/dL (ref 30.0–36.0)
MCV: 97.2 fL (ref 80.0–100.0)
Platelets: 246 10*3/uL (ref 150–400)
RBC: 4.22 MIL/uL (ref 3.87–5.11)
RDW: 14.4 % (ref 11.5–15.5)
WBC: 7.9 10*3/uL (ref 4.0–10.5)
nRBC: 0 % (ref 0.0–0.2)

## 2020-09-09 LAB — URINALYSIS, ROUTINE W REFLEX MICROSCOPIC
Bilirubin Urine: NEGATIVE
Glucose, UA: NEGATIVE mg/dL
Hgb urine dipstick: NEGATIVE
Ketones, ur: NEGATIVE mg/dL
Nitrite: POSITIVE — AB
Protein, ur: NEGATIVE mg/dL
Specific Gravity, Urine: >1.046 — ABNORMAL HIGH (ref 1.005–1.030)
pH: 5 (ref 5.0–8.0)

## 2020-09-09 LAB — COMPREHENSIVE METABOLIC PANEL
ALT: 15 U/L (ref 0–44)
AST: 19 U/L (ref 15–41)
Albumin: 4 g/dL (ref 3.5–5.0)
Alkaline Phosphatase: 71 U/L (ref 38–126)
Anion gap: 7 (ref 5–15)
BUN: 10 mg/dL (ref 8–23)
CO2: 21 mmol/L — ABNORMAL LOW (ref 22–32)
Calcium: 11.2 mg/dL — ABNORMAL HIGH (ref 8.9–10.3)
Chloride: 103 mmol/L (ref 98–111)
Creatinine, Ser: 1.07 mg/dL — ABNORMAL HIGH (ref 0.44–1.00)
GFR, Estimated: 58 mL/min — ABNORMAL LOW (ref >60)
Glucose, Bld: 105 mg/dL — ABNORMAL HIGH (ref 70–99)
Potassium: 4.5 mmol/L (ref 3.5–5.1)
Sodium: 131 mmol/L — ABNORMAL LOW (ref 135–145)
Total Bilirubin: 0.8 mg/dL (ref 0.3–1.2)
Total Protein: 8.8 g/dL — ABNORMAL HIGH (ref 6.5–8.1)

## 2020-09-09 LAB — LIPASE, BLOOD: Lipase: 149 U/L — ABNORMAL HIGH (ref 11–51)

## 2020-09-09 MED ORDER — IOHEXOL 300 MG/ML  SOLN
100.0000 mL | Freq: Once | INTRAMUSCULAR | Status: AC | PRN
  Administered 2020-09-09: 100 mL via INTRAVENOUS

## 2020-09-09 MED ORDER — OXYCODONE-ACETAMINOPHEN 5-325 MG PO TABS
1.0000 | ORAL_TABLET | ORAL | 0 refills | Status: DC | PRN
Start: 2020-09-09 — End: 2020-09-27

## 2020-09-09 MED ORDER — FENTANYL CITRATE (PF) 100 MCG/2ML IJ SOLN
50.0000 ug | Freq: Once | INTRAMUSCULAR | Status: AC
  Administered 2020-09-09: 50 ug via INTRAMUSCULAR
  Filled 2020-09-09: qty 2

## 2020-09-09 MED ORDER — FENTANYL CITRATE (PF) 100 MCG/2ML IJ SOLN
25.0000 ug | Freq: Once | INTRAMUSCULAR | Status: AC
  Administered 2020-09-09: 25 ug via INTRAVENOUS
  Filled 2020-09-09 (×2): qty 2

## 2020-09-09 MED ORDER — ONDANSETRON 8 MG PO TBDP
8.0000 mg | ORAL_TABLET | Freq: Once | ORAL | Status: AC
  Administered 2020-09-09: 8 mg via ORAL
  Filled 2020-09-09: qty 1

## 2020-09-09 MED ORDER — ONDANSETRON HCL 4 MG/2ML IJ SOLN
4.0000 mg | Freq: Once | INTRAMUSCULAR | Status: AC
  Administered 2020-09-09: 4 mg via INTRAVENOUS
  Filled 2020-09-09: qty 2

## 2020-09-09 NOTE — ED Notes (Signed)
Pt has been a hard stick for labs and IV.  Kem Parkinson, PA aware.

## 2020-09-09 NOTE — ED Provider Notes (Signed)
  Face-to-face evaluation   History: She presents for evaluation abdominal pain.  She has been ill for 2 days.  She complains of vomiting without diarrhea.  She denies fever, chills, blood in emesis, back pain, weakness or dizziness.  Last episode like this was about 9 months ago.  No known sick contacts.  There are no other known modifying factors  Physical exam: Obese elderly female.  She is lucid.  No dysarthria or aphasia.  No respiratory distress.  She is sitting on the edge of the bed and appears comfortable    Medical screening examination/treatment/procedure(s) were conducted as a shared visit with non-physician practitioner(s) and myself.  I personally evaluated the patient during the encounter    Daleen Bo, MD 09/10/20 1356

## 2020-09-09 NOTE — ED Triage Notes (Signed)
Abdominal pain onset 2 days ago

## 2020-09-09 NOTE — ED Notes (Signed)
Rushie Chestnut, RN able to obtain labs but not able to get IV site.

## 2020-09-09 NOTE — ED Notes (Addendum)
Once back from CT, pt became increasingly nauseous and started dry heaving. Provider was made aware and orders were placed.  D/t the dry heaving, pt states that her pain is now back up to a 7/10.

## 2020-09-09 NOTE — ED Provider Notes (Signed)
[facility] EMERGENCY DEPARTMENT Provider Note   CSN: [id number] Arrival date & time: 09/09/20  1403     History Chief Complaint  Patient presents with  . Abdominal Pain    RANESHA VAL is a 65 y.o. female.  HPI      EMILINA SMARR is a 65 y.o. female with past medical history of alcoholic cirrhosis, pancreatitis, hypertension, [name], who presents to the Emergency Department complaining of gradually worsening abdominal pain for 2 to 3 days.  She describes a waxing and waning pain across her upper abdomen.  Pain is been associated with nausea, no vomiting.  She states that she has history of pancreatitis and has an upcoming appointment with her gastroenterologist next week.  States she has been scheduled for a CT of her abdomen.  Patient admits to prior alcohol use, last use was during the holidays.  None recently.  States she took a hydrocodone earlier today with temporary relief.  Reports normal BMs.  She denies fever, chills, shortness of breath and chest pain.  No back pain or dysuria.  States her pain feels similar to previous episodes.  No recent abdominal surgeries, no history of bowel obstruction.     Past Medical History:  Diagnosis Date  . Asthma   . Chronic abdominal pain   . Chronic back pain   . Chronic constipation   . Cirrhosis ([place])    Metavir score F4 on elastography 2015  . Cyclical vomiting   . Fibroids   . Fibromyalgia   . GERD (gastroesophageal reflux disease)   . H. pylori infection 2014   treated with pylera, had to be treated again as it was not eradicated. Urea breath test then negative after subsequent treatment.   . [name]    HCV RNA positive 09/2012  . Hypertension   . Marijuana use   . Nausea and vomiting    chronic, recurrent  . PONV (postoperative nausea and vomiting)    pt thinks maybe once she had N&V  . Sciatica of left side     Patient Active Problem List   Diagnosis Date Noted  . Acute pancreatitis 10/17/2019  . ETOH  abuse 10/17/2019  . S/P right rotator cuff repair 01/30/19 02/06/2019  . Nontraumatic complete tear of right rotator cuff   . Arthritis of right acromioclavicular joint   . S/P total knee replacement, 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/24/2018  . Shoulder impingement, right 05/24/2018  . Dehydration   . Intractable cyclical vomiting 69/79/4801  . Pyloric stenosis in adult   . Thrombocytopenia ([place]) 12/20/2017  . Leukocytosis   . Malnutrition of moderate degree 12/17/2017  . Acute renal failure (ARF) ([place]) 12/15/2017  . Chronic abdominal pain 12/15/2017  . Gastroenteritis 06/14/2016  . Nausea with vomiting 06/10/2016  . Uterine enlargement 06/09/2016  . Intractable nausea and vomiting 06/08/2016  . Acute infective gastroenteritis 06/08/2016  . Diarrhea 06/08/2016  . Essential hypertension 06/08/2016  . GERD (gastroesophageal reflux disease) 06/08/2016  . Abdominal pain   . Endometrial polyp 04/15/2016  . PMB (postmenopausal bleeding) 02/27/2016  . Alcoholic cirrhosis of liver without ascites ([place])   . Asthma 01/21/2016  . Hepatic cirrhosis ([place]) 09/22/2015  . Arthritis of knee, degenerative 02/05/2015  . History of Helicobacter pylori infection 10/30/2014  . Chronic [name] with cirrhosis ([place]) 04/11/2014  . De Quervain's disease (radial styloid tenosynovitis) 12/25/2013  . Anorexia 11/21/2012  . FH: colon cancer 11/21/2012  . Early satiety 10/25/2012  . Bowel habit changes 10/25/2012  .  Abdominal pain, epigastric 10/25/2012  . Abdominal bloating 10/25/2012  . Constipation 10/25/2012  . Abnormal weight loss 10/25/2012  . Chronic viral [name] ([place]) 10/25/2012  . Radicular pain of left lower extremity 09/28/2012  . Back pain 09/28/2012  . Sciatica 08/10/2011  . S/P arthroscopy of left knee 08/10/2011  . Tibial plateau fracture 08/10/2011  . Pain in joint, lower leg 02/12/2011  . Stiffness of joint, not elsewhere classified, lower leg 02/12/2011  . 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   . Meniscus, medial, derangement 12/29/2010  . CLOSED FRACTURE OF UPPER END OF TIBIA 08/12/2010    Past Surgical History:  Procedure Laterality Date  . BALLOON DILATION  12/20/2017   Procedure: BALLOON DILATION;  Surgeon: Danie Binder, MD;  Location: [facility];  Service: Endoscopy;;  pyloric dilation  . BIOPSY  12/20/2017   Procedure: BIOPSY;  Surgeon: Danie Binder, MD;  Location: [facility];  Service: Endoscopy;;  duodenal and gastric biopsy  . COLONOSCOPY  01/18/2008   JKD:TOIZTI rectum.  Long redundant colon, a diminutive sigmoid polyp status post cold biopsy removed. Hyperplastic polyp. Repeat colonoscopy June 2014 due to family history of colon cancer  . COLONOSCOPY WITH ESOPHAGOGASTRODUODENOSCOPY (EGD) N/A 11/02/2012   WPY:KDXIPJAS gastric mucosa of doubtful, +H.pylori. Incomplete colonoscopy due to patient unable to tolerate exam, proximal colon seen. Patient refused ACBE.  Marland Kitchen COLONOSCOPY WITH PROPOFOL N/A 03/08/2013   Dr. Gala Romney: colonic polyp-removed as scribed above. Internal Hemorrhoids. Pathology did not reveal any colonic tissue, only mucus. SURVEILLANCE DUE Aug 2019  . COLONOSCOPY WITH PROPOFOL N/A 01/19/2018   Dr. Gala Romney: 6 mm cecal inflammatoy polyp, otherwise normal., Surveilance in 5 year s  . ESOPHAGEAL DILATION  12/20/2017   Procedure: ESOPHAGEAL DILATION;  Surgeon: Danie Binder, MD;  [facility];  Service: Endoscopy;;  . ESOPHAGOGASTRODUODENOSCOPY  01/18/2008   RMR: Normal esophagus, normal  stomach  . ESOPHAGOGASTRODUODENOSCOPY (EGD) WITH PROPOFOL N/A 02/09/2016   Normal esophagus, small hiatal hernia, portal hypertensive gastropathy, normal second portion of duodenum. Due in July 2019  . ESOPHAGOGASTRODUODENOSCOPY (EGD) WITH PROPOFOL N/A 12/20/2017   Dr. Oneida Alar: benign appearing esophageal stenosis s/p dilation, mild pyloric stenosis s/p biopsy and dilation, mild duodenitis.   Marland Kitchen ESOPHAGOGASTRODUODENOSCOPY (EGD) WITH PROPOFOL N/A 06/12/2020    Procedure: ESOPHAGOGASTRODUODENOSCOPY (EGD) WITH PROPOFOL;  Surgeon: Daneil Dolin, MD;  [facility]: [facility];  Service: Endoscopy;  Laterality: N/A;  9:00am  . HYSTEROSCOPY  05/11/2016   Procedure: HYSTEROSCOPY;  Surgeon: Jonnie Kind, MD;  Location: [facility];  Service: Gynecology;;  . KNEE SURGERY     left knee  . MULTIPLE EXTRACTIONS WITH ALVEOLOPLASTY N/A 10/15/2013   Procedure: MULTIPLE EXTRACTION WITH ALVEOLOPLASTY;  Surgeon: Gae Bon, DDS;  Location: [place];  Service: Oral Surgery;  Laterality: N/A;  . POLYPECTOMY N/A 03/08/2013   Procedure: POLYPECTOMY;  Surgeon: Daneil Dolin, MD;  Location: [facility];  Service: Endoscopy;  Laterality: N/A;  . POLYPECTOMY N/A 05/11/2016   Procedure: REMOVAL OF ENDOMETRIAL POLYP;  Surgeon: Jonnie Kind, MD;  Location: [facility];  Service: Gynecology;  Laterality: N/A;  . RESECTION DISTAL CLAVICAL Right 01/30/2019   Procedure: RESECTION DISTAL CLAVICAL;  Surgeon: Carole Civil, MD;  Location: [facility];  Service: Orthopedics;  Laterality: Right;  . SHOULDER OPEN ROTATOR CUFF REPAIR Right 01/30/2019   Procedure: ROTATOR CUFF REPAIR SHOULDER OPEN;  Surgeon: Carole Civil, MD;  Location: [facility];  Service: Orthopedics;  Laterality: Right;  pt to arrive at 7:30 for  PICC at 8:00  . TOTAL KNEE ARTHROPLASTY Left 02/05/2015   Procedure: LEFT TOTAL KNEE ARTHROPLASTY;  Surgeon: Carole Civil, MD;  Location: [facility];  Service: Orthopedics;  Laterality: Left;     OB History    Gravida      Para      Term      Preterm      AB      Living  1     SAB      IAB      Ectopic      Multiple      Live Births              Family History  Problem Relation Age of Onset  . Colon cancer Brother 1          . Multiple myeloma Brother   . Liver cancer Sister   . Prostate cancer Brother   . Pancreatic cancer Brother   . Cancer Mother        breast  . Asthma Mother     Social History   Tobacco Use  . Smoking status: Current  Every Day Smoker    Packs/day: 1.00    Years: 40.00    Pack years: 40.00    Types: Cigarettes  . Smokeless tobacco: Never Used  . Tobacco comment: Smokes one pack of cigarettes every 1.5 days  Vaping Use  . Vaping Use: Never used  Substance Use Topics  . Alcohol use: Yes    Alcohol/week: 0.0 standard drinks    Comment: occ  . Drug use: Not Currently    Types: Marijuana    Comment: history of marijuana in past- almost 2 years ago    Home Medications Prior to Admission medications   Medication Sig Start Date End Date Taking? Authorizing Provider  albuterol (PROVENTIL HFA;VENTOLIN HFA) 108 (90 Base) MCG/ACT inhaler Inhale 2 puffs into the lungs every 6 (six) hours as needed for wheezing or shortness of breath.     [provider]  Cyanocobalamin (B-12) 2500 MCG TABS Take 2,500 mcg by mouth daily.    [provider]  cyclobenzaprine (FLEXERIL) 5 MG tablet Take 1 tablet (5 mg total) by mouth 2 (two) times daily as needed for muscle spasms. Patient taking differently: Take 5 mg by mouth daily.  12/07/19   Carole Civil, MD  gabapentin (NEURONTIN) 100 MG capsule Take 1 capsule (100 mg total) by mouth 3 (three) times daily. Patient taking differently: Take 100 mg by mouth at bedtime.  12/22/18   Carole Civil, MD  hydrALAZINE (APRESOLINE) 50 MG tablet Take 1 tablet (50 mg total) by mouth every 8 (eight) hours. 01/30/18   Barton Dubois, MD  HYDROcodone-acetaminophen (NORCO/VICODIN) 5-325 MG tablet TAKE 1 TABLET BY MOUTH EVERY 6 HOURS AS NEEDED FOR MODERATE PAIN. 08/25/20   Carole Civil, MD  hydrOXYzine (ATARAX/VISTARIL) 10 MG tablet Take 10 mg by mouth daily.  11/30/17   [provider]  labetalol (NORMODYNE) 100 MG tablet Take 2 tablets (200 mg total) by mouth 2 (two) times daily. Patient taking differently: Take 100 mg by mouth 2 (two) times daily.  01/30/18   Barton Dubois, MD  megestrol (MEGACE) 40 MG tablet Take 40 mg by mouth daily.    [provider]  ondansetron (ZOFRAN) 4 MG tablet Take 4 mg by mouth every 6 (six) hours as needed for nausea. 12/28/19   [provider]  oxyCODONE (ROXICODONE) 5 MG immediate release tablet Take  1 tablet (5 mg total) by mouth every 4 (four) hours as needed for severe pain. 06/10/20   Tacy Learn, PA-C  pantoprazole (PROTONIX) 40 MG tablet Take 1 tablet (40 mg total) by mouth daily. 30 minutes before breakfast 09/07/19   Annitta Needs, NP  potassium chloride (MICRO-K) 10 MEQ CR capsule Take 20 mEq by mouth daily.    [provider]  TRULANCE 3 MG TABS TAKE ONE TABLET BY MOUTH ONCE DAILY. 07/03/20   Carlis Stable, NP    Allergies    Penicillins  Review of Systems   Review of Systems  Constitutional: Negative for appetite change, chills and fever.  Respiratory: Negative for shortness of breath.   Cardiovascular: Negative for chest pain.  Gastrointestinal: Positive for abdominal distention, abdominal pain and nausea. Negative for blood in stool, diarrhea and vomiting.  Genitourinary: Negative for decreased urine volume, difficulty urinating, dysuria and flank pain.  Musculoskeletal: Negative for back pain.  Skin: Negative for color change and rash.  Neurological: Negative for dizziness, weakness and numbness.  Hematological: Negative for adenopathy.    Physical Exam Updated Vital Signs BP (!) 137/102 (BP Location: Right Arm)   Pulse (!) 108   Temp 98.7 F (37.1 C) (Oral)   Resp 20   Ht $R'5\' 6"'Di$  (1.676 m)   Wt 83.3 kg   SpO2 100%   BMI 29.63 kg/m   Physical Exam Vitals and nursing note reviewed.  Constitutional:      Appearance: She is well-developed. She is not toxic-appearing.  HENT:     Mouth/Throat:     Mouth: Mucous membranes are moist.  Cardiovascular:     Rate and Rhythm: Normal rate and regular rhythm.     Pulses: Normal pulses.  Pulmonary:     Effort: Pulmonary effort is normal.  Abdominal:     General: There is distension.     Tenderness: There is  abdominal tenderness. There is no right CVA tenderness, left CVA tenderness or guarding.  Musculoskeletal:        General: Normal range of motion.     Cervical back: Normal range of motion.     Right lower leg: No edema.     Left lower leg: No edema.  Skin:    General: Skin is warm.     Capillary Refill: Capillary refill takes less than 2 seconds.     Findings: No rash.  Neurological:     General: No focal deficit present.     Mental Status: She is alert.     Sensory: No sensory deficit.     Motor: No weakness.     ED Results / Procedures / Treatments   Labs (all labs ordered are listed, but only abnormal results are displayed) Labs Reviewed  LIPASE, BLOOD - Abnormal; Notable for the following components:      Result Value   Lipase 149 (*)    All other components within normal limits  COMPREHENSIVE METABOLIC PANEL - Abnormal; Notable for the following components:   Sodium 131 (*)    CO2 21 (*)    Glucose, Bld 105 (*)    Creatinine, Ser 1.07 (*)    Calcium 11.2 (*)    Total Protein 8.8 (*)    GFR, Estimated 58 (*)    All other components within normal limits  CBC  URINALYSIS, ROUTINE W REFLEX MICROSCOPIC    EKG EKG Interpretation  Date/Time:  Tuesday September 09 2020 15:06:59 EST Ventricular Rate:  113 PR Interval:  114 QRS Duration: 80 QT Interval:  320 QTC Calculation: 438 R Axis:   74 Text Interpretation: Sinus tachycardia Nonspecific ST abnormality Abnormal ECG since last tracing no significant change Confirmed by Daleen Bo [phone]) on 09/09/2020 3:20:06 PM   Radiology CT ABDOMEN PELVIS W CONTRAST  Result Date: 09/09/2020 CLINICAL DATA:  Nausea and vomiting. Epigastric pain starting 2 days ago. EXAM: CT ABDOMEN AND PELVIS WITH CONTRAST TECHNIQUE: Multidetector CT imaging of the abdomen and pelvis was performed using the standard protocol following bolus administration of intravenous contrast. CONTRAST:  120mL OMNIPAQUE IOHEXOL 300 MG/ML  SOLN COMPARISON:   06/10/2020 FINDINGS: Lower chest: Dependent subsegmental atelectasis in both lower lobes. Descending thoracic aortic atherosclerotic calcification. Hepatobiliary: Unremarkable Pancreas: Abnormal peripancreatic stranding compatible with acute pancreatitis. No pancreatic abscess, pseudocyst, or necrosis. No dorsal pancreatic duct dilatation. Spleen: Unremarkable Adrenals/Urinary Tract: Single tiny hypodense bilateral renal lesions are technically nonspecific although statistically likely to be benign cysts. Wall thickening diffusely in the urinary bladder is probably from nondistention, although cystitis could have a similar appearance. Adrenal glands normal. Stomach/Bowel: Unremarkable Vascular/Lymphatic: Aortoiliac atherosclerotic vascular disease. Patent portal vein and splenic vein. No pathologic adenopathy identified. Reproductive: Stable appearance of calcified uterine fibroids. Other: No supplemental non-categorized findings. Musculoskeletal: Moderate degenerative hip arthropathy bilaterally. Mild lumbar degenerative disc disease. IMPRESSION: 1. Acute pancreatitis without complicating feature identified. 2. Wall thickening diffusely in the urinary bladder is probably from nondistention, although cystitis could have a similar appearance. 3. Stable appearance of calcified uterine fibroids. 4. Moderate degenerative hip arthropathy bilaterally. Mild lumbar degenerative disc disease. 5. Aortic atherosclerosis. Aortic Atherosclerosis (ICD10-I70.0). Electronically Signed   By: Van Clines M.D.   On: 09/09/2020 20:51    Procedures Procedures   Medications Ordered in ED Medications - No data to display  ED Course  I have reviewed the triage vital signs and the nursing notes.  Pertinent labs & imaging results that were available during my care of the patient were reviewed by me and considered in my medical decision making (see chart for details).    MDM Rules/Calculators/A&P                           Patient here with 3-day history of upper abdominal pain.   She does have a history of hep C, pancreatitis and cirrhosis of the liver.  On exam, she appears uncomfortable but nontoxic.  Vital signs reviewed.  Abdomen appears slightly distended, and no ascites.  No recent surgical history of the abdomen.  Patient admits to alcohol use during the holidays, but denies alcohol use since.  Does not appear acutely intoxicated.  On recheck, patient resting comfortably, pain improved after IV pain medication and antiemetic.  Labs interpreted by me.  No leukocytosis.  Kidney function appears baseline.  Mild hyponatremia.  Lipase slightly elevated at 149, similar to 3 months ago.  CT abdomen pelvis show acute pancreatitis, no abscess or other complicating factors.  Patient has GI appointment on 09/16/2020.  I have offered admission, but patient declines stating that she prefers discharge home.  Will treat with oral pain medication, she has Zofran at home.  Return precautions discussed.   Final Clinical Impression(s) / ED Diagnoses Final diagnoses:  Acute pancreatitis without infection or necrosis, unspecified pancreatitis type    Rx / DC Orders ED Discharge Orders    None       Kem Parkinson, PA-C 09/09/20 2354    Daleen Bo, MD 09/10/20 1356

## 2020-09-09 NOTE — Discharge Instructions (Addendum)
The CT of your abdomen this evening shows pancreatitis.  This can be triggered by alcohol use.  Please avoid alcohol spicy or greasy foods.  Be sure to keep your appointment with Dr. Roseanne Kaufman office next week.  Return to the emergency department if you develop any worsening symptoms.

## 2020-09-10 MED FILL — Hydrocodone-Acetaminophen Tab 5-325 MG: ORAL | Qty: 6 | Status: AC

## 2020-09-16 ENCOUNTER — Encounter: Payer: Self-pay | Admitting: Gastroenterology

## 2020-09-16 ENCOUNTER — Ambulatory Visit (INDEPENDENT_AMBULATORY_CARE_PROVIDER_SITE_OTHER): Payer: Medicare Other | Admitting: Gastroenterology

## 2020-09-16 ENCOUNTER — Other Ambulatory Visit: Payer: Self-pay

## 2020-09-16 ENCOUNTER — Ambulatory Visit: Payer: Medicare Other | Admitting: Gastroenterology

## 2020-09-16 VITALS — BP 118/79 | HR 95 | Temp 97.6°F | Ht 66.0 in | Wt 185.6 lb

## 2020-09-16 DIAGNOSIS — K5900 Constipation, unspecified: Secondary | ICD-10-CM | POA: Diagnosis not present

## 2020-09-16 DIAGNOSIS — K852 Alcohol induced acute pancreatitis without necrosis or infection: Secondary | ICD-10-CM

## 2020-09-16 DIAGNOSIS — K219 Gastro-esophageal reflux disease without esophagitis: Secondary | ICD-10-CM

## 2020-09-16 DIAGNOSIS — R300 Dysuria: Secondary | ICD-10-CM

## 2020-09-16 DIAGNOSIS — K7460 Unspecified cirrhosis of liver: Secondary | ICD-10-CM | POA: Diagnosis not present

## 2020-09-16 NOTE — Progress Notes (Signed)
Primary Care Physician: Rosita Fire, MD  Primary Gastroenterologist:  Garfield Cornea, MD   Chief Complaint  Patient presents with  . Cirrhosis    F/u  . Abdominal Pain    "all over", daily, diagnosed 2/8 by ER for pancreatitis, wanted to admit but she didn't want too    HPI: Colleen Lee is a 65 y.o. 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 EGD 11/2017 with benign appearing esophageal stenosis s/p dilation, mild pyloric stenosis s/p dilation, mild duodenitis.  Evaluated by vascular previously with anxious to return as needed.  Previous ultrasounds, she had nodular contours of the liver noted in 2018 but imaging thereafter with coarse liver, spleen normal.  Thrombocytopenia previously but symptoms resolved.  Last EGD November 2021: Normal-appearing esophagus, no varices, portal hypertensive gastropathy, no specimens collected.  Plans for repeat EGD in 2 years  Last colonoscopy June 2019, 6 mm cecal inflammatory polyp removed.  Surveillance in 5 years.  Patient seen in the ED on September 09, 2020 for abdominal pain, 3-day duration.  CT abdomen pelvis with contrast while in the ED showed abnormal peripancreatic stranding compatible with acute pancreatitis.  Wall thickening diffusely in the urinary bladder probably from nondistention although cystitis could have similar appearance.  Stable calcified uterine fibroids.  CBC was normal.  Sodium 131, BUN 10, creatinine 1.07, LFTs normal, lipase 149.  UA showed moderate leukocytes, positive nitrite, 21-50 WBCs/hpf, few bacteria.   Urine culture not completed.  Abdominal pain started up after Christmas. At first thought it was constipation. Has had pancreatis few times. Finally realized pain not going away and thought it might be her pancreas. Offered admission in ED last week but she felt like she needed to be at home with her ill husband. Abdominal pain some better. Tolerating soft foods. Mostly eating soups right now. Eating banana pudding. Avoiding heavy food. Drinking plenty of fluids. Urine light yellow. Little bit of burning with urination. No heartburn or vomiting. Abdominal pain started feeling some better yesterday. BM less since not eating well. Loose with some form. No melena, brbpr. Feels weak but improved. No chest pain, sob, lightheadedness.  Patient previously reported etoh on holidays. Lately drinking dinner wine every day. Buys two bottles per week. States she "drinks it slowly".   Referred to pain clinica by Dr. Aline Brochure, orthopedic. She missed appt due to being sick. States her appt is Friday. States no longer receiving pain medication from Dr. Aline Brochure, last hydrocodone filled in 08/2020. Received #8 oxycodone in ED last week for pancreatitis.     Current Outpatient Medications  Medication Sig Dispense Refill  . albuterol (PROVENTIL HFA;VENTOLIN HFA) 108 (90 Base) MCG/ACT inhaler Inhale 2 puffs into the lungs every 6 (six) hours as needed for wheezing or shortness of breath.     . Cyanocobalamin (B-12) 2500 MCG TABS Take 2,500 mcg by mouth daily.    . cyclobenzaprine (FLEXERIL) 5 MG tablet Take 1 tablet (5 mg total) by mouth 2 (two) times daily as needed for muscle spasms. (Patient taking differently: Take 5 mg by mouth daily.) 30 tablet 1  . gabapentin (NEURONTIN) 100 MG capsule Take 1 capsule (100 mg total) by  mouth 3 (three) times daily. (Patient taking differently: Take 100 mg by mouth at bedtime.) 90 capsule 2  . hydrALAZINE (APRESOLINE) 50 MG tablet Take 1 tablet (50 mg total) by mouth every 8 (eight)  hours. 90 tablet 1  . hydrOXYzine (ATARAX/VISTARIL) 10 MG tablet Take 10 mg by mouth daily.     Marland Kitchen labetalol (NORMODYNE) 100 MG tablet Take 2 tablets (200 mg total) by mouth 2 (two) times daily. (Patient taking differently: Take 100 mg by mouth 2 (two) times daily.) 120 tablet 1  . megestrol (MEGACE) 40 MG tablet Take 40 mg by mouth daily.    . ondansetron (ZOFRAN) 4 MG tablet Take 4 mg by mouth every 6 (six) hours as needed for nausea.    Marland Kitchen oxyCODONE-acetaminophen (PERCOCET/ROXICET) 5-325 MG tablet Take 1 tablet by mouth every 4 (four) hours as needed for severe pain. 6 tablet 0  . oxyCODONE-acetaminophen (PERCOCET/ROXICET) 5-325 MG tablet Take 1 tablet by mouth every 4 (four) hours as needed. 8 tablet 0  . pantoprazole (PROTONIX) 40 MG tablet Take 1 tablet (40 mg total) by mouth daily. 30 minutes before breakfast 90 tablet 3  . potassium chloride (MICRO-K) 10 MEQ CR capsule Take 20 mEq by mouth daily.     No current facility-administered medications for this visit.    Allergies as of 09/16/2020 - Review Complete 09/16/2020  Allergen Reaction Noted  . Penicillins Rash     ROS:  General: Negative for anorexia, fever, chills, fatigue, weakness. Weight down 10 pounds since 06/2020. ENT: Negative for hoarseness, difficulty swallowing , nasal congestion. CV: Negative for chest pain, angina, palpitations, dyspnea on exertion, peripheral edema.  Respiratory: Negative for dyspnea at rest, dyspnea on exertion, cough, sputum, wheezing.  GI: See history of present illness. GU:  Negative for dysuria, hematuria, urinary incontinence, urinary frequency, nocturnal urination.  Endo: Negative for unusual weight change.    Physical Examination:   BP 118/79   Pulse 95   Temp 97.6 F (36.4 C)   Ht 5\' 6"  (1.676 m)   Wt 185 lb 9.6 oz (84.2 kg)   BMI 29.96 kg/m   General: Well-nourished, well-developed in no acute distress.  Eyes: No icterus. Mouth: masked Lungs: Clear to auscultation bilaterally.   Heart: Regular rate and rhythm, no murmurs rubs or gallops.  Abdomen: Bowel sounds are normal,   nondistended, no hepatosplenomegaly or masses, no abdominal bruits or hernia , no rebound or guarding.  Exam benign. Positive rectus diastasis. Mild diffuse tenderness with deep palpation with soft abdomen.  Extremities: No lower extremity edema. No clubbing or deformities. Neuro: Alert and oriented x 4   Skin: Warm and dry, no jaundice.   Psych: Alert and cooperative, normal mood and affect.  Labs:  Lab Results  Component Value Date   CREATININE 1.07 (H) 09/09/2020   BUN 10 09/09/2020   NA 131 (L) 09/09/2020   K 4.5 09/09/2020   CL 103 09/09/2020   CO2 21 (L) 09/09/2020   Lab Results  Component Value Date   ALT 15 09/09/2020   AST 19 09/09/2020   ALKPHOS 71 09/09/2020   BILITOT 0.8 09/09/2020   Lab Results  Component Value Date   LIPASE 149 (H) 09/09/2020   Lab Results  Component Value Date   WBC 7.9 09/09/2020   HGB 13.5 09/09/2020   HCT 41.0 09/09/2020   MCV 97.2 09/09/2020   PLT 246 09/09/2020     Imaging Studies: CT ABDOMEN PELVIS W CONTRAST  Result Date: 09/09/2020 CLINICAL DATA:  Nausea and vomiting. Epigastric pain starting 2 days ago. EXAM: CT ABDOMEN AND PELVIS WITH CONTRAST TECHNIQUE: Multidetector CT imaging of the abdomen and pelvis was performed using the standard protocol following bolus administration of intravenous contrast. CONTRAST:  157mL OMNIPAQUE IOHEXOL 300 MG/ML  SOLN COMPARISON:  06/10/2020 FINDINGS: Lower chest: Dependent subsegmental atelectasis in both lower lobes. Descending thoracic aortic atherosclerotic calcification. Hepatobiliary: Unremarkable Pancreas: Abnormal peripancreatic stranding compatible with acute pancreatitis. No pancreatic abscess, pseudocyst, or necrosis. No dorsal pancreatic duct dilatation. Spleen: Unremarkable Adrenals/Urinary Tract: Single tiny hypodense bilateral renal lesions are technically nonspecific although statistically  likely to be benign cysts. Wall thickening diffusely in the urinary bladder is probably from nondistention, although cystitis could have a similar appearance. Adrenal glands normal. Stomach/Bowel: Unremarkable Vascular/Lymphatic: Aortoiliac atherosclerotic vascular disease. Patent portal vein and splenic vein. No pathologic adenopathy identified. Reproductive: Stable appearance of calcified uterine fibroids. Other: No supplemental non-categorized findings. Musculoskeletal: Moderate degenerative hip arthropathy bilaterally. Mild lumbar degenerative disc disease. IMPRESSION: 1. Acute pancreatitis without complicating feature identified. 2. Wall thickening diffusely in the urinary bladder is probably from nondistention, although cystitis could have a similar appearance. 3. Stable appearance of calcified uterine fibroids. 4. Moderate degenerative hip arthropathy bilaterally. Mild lumbar degenerative disc disease. 5. Aortic atherosclerosis. Aortic Atherosclerosis (ICD10-I70.0). Electronically Signed   By: Van Clines M.D.   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  Advanced fibrosis/possible underlying cirrhosis: Normal spleen, thrombocytopenia previously resolved.  History of portal gastropathy on prior EGDs in 2017 and currently.  No varices noted.  Given concern for advanced fibrosis/possible early cirrhosis (nodular hepatic border seen on imaging in 2018 subsequently not reported) she has been followed with serial imaging and EGDs.  Next EGD due in 2 years.  Current liver imaging is up-to-date via CT with contrast last week, liver and spleen reportedly unremarkable.  Patient reports drinking wine, 2 bottles per week.  Encouraged her to discontinue all alcohol  use given her history of pancreatitis and advanced fibrosis.  Pancreatitis: Episode in early 2021, thought to be EtOH related.  CT pancreatic protocol recommended after last visit, patient did not complete.  Recent recurrent pancreatitis in the setting of alcohol use.  Current CT as outlined above.  Clinically improving.  Encourage patient to slowly add back solid foods, low-fat.  Continue adequate oral intake.  We will update labs today.  She will follow up with pain management later this week as planned.  Given abdominal pain has improved over the last 48 hours and benign exam today, no pain medication provided.  History of constipation: Bowel movements have been better up until recent episode of pancreatitis.  Limited oral intake, bowel movements less frequent but remain soft.  GERD: Controlled on pantoprazole daily.  Dysuria: Abnormal urinalysis while in the ED last week.  Complains of burning with urination.  Possible cystitis noted on CT.  Check UA with culture.  Plan:  1. Refrain from all alcohol use. 2. Update labs today. 3. Slowly advance diet to nonfat solid foods. 4. Return to the office in 3 months or call sooner if needed.

## 2020-09-16 NOTE — Patient Instructions (Signed)
1. No alcohol (wine/liquour/beer) is safe for you due to scarring in your liver and recurrent pancreatitis.  2. Please have your labs done today. We will contact you with results as available. 3. Slowly add back solid foods, avoid greasy and fried foods.  4. Drink plenty of fluids.  5. Keep upcoming appointment at the pain clinic.  6. Return to our office in 3 months.

## 2020-09-17 DIAGNOSIS — R300 Dysuria: Secondary | ICD-10-CM | POA: Diagnosis not present

## 2020-09-17 DIAGNOSIS — K7460 Unspecified cirrhosis of liver: Secondary | ICD-10-CM | POA: Diagnosis not present

## 2020-09-19 DIAGNOSIS — E559 Vitamin D deficiency, unspecified: Secondary | ICD-10-CM | POA: Diagnosis not present

## 2020-09-19 DIAGNOSIS — G894 Chronic pain syndrome: Secondary | ICD-10-CM | POA: Diagnosis not present

## 2020-09-19 DIAGNOSIS — M5090 Cervical disc disorder, unspecified, unspecified cervical region: Secondary | ICD-10-CM | POA: Diagnosis not present

## 2020-09-19 DIAGNOSIS — M129 Arthropathy, unspecified: Secondary | ICD-10-CM | POA: Diagnosis not present

## 2020-09-19 DIAGNOSIS — Z79899 Other long term (current) drug therapy: Secondary | ICD-10-CM | POA: Diagnosis not present

## 2020-09-20 LAB — URINALYSIS W MICROSCOPIC + REFLEX CULTURE
Bilirubin Urine: NEGATIVE
Glucose, UA: NEGATIVE
Hgb urine dipstick: NEGATIVE
Hyaline Cast: NONE SEEN /LPF
Nitrites, Initial: POSITIVE — AB
Specific Gravity, Urine: 1.028 (ref 1.001–1.03)
WBC, UA: >=60 /HPF — AB (ref 0–5)
pH: <=5 (ref 5.0–8.0)

## 2020-09-20 LAB — CBC WITH DIFFERENTIAL/PLATELET
Absolute Monocytes: 742 cells/uL (ref 200–950)
Basophils Absolute: 42 cells/uL (ref 0–200)
Basophils Relative: 0.6 %
Eosinophils Absolute: 140 cells/uL (ref 15–500)
Eosinophils Relative: 2 %
HCT: 37.9 % (ref 35.0–45.0)
Hemoglobin: 12.7 g/dL (ref 11.7–15.5)
Lymphs Abs: 1337 cells/uL (ref 850–3900)
MCH: 31.6 pg (ref 27.0–33.0)
MCHC: 33.5 g/dL (ref 32.0–36.0)
MCV: 94.3 fL (ref 80.0–100.0)
MPV: 10.8 fL (ref 7.5–12.5)
Monocytes Relative: 10.6 %
Neutro Abs: 4739 cells/uL (ref 1500–7800)
Neutrophils Relative %: 67.7 %
Platelets: 306 10*3/uL (ref 140–400)
RBC: 4.02 10*6/uL (ref 3.80–5.10)
RDW: 13 % (ref 11.0–15.0)
Total Lymphocyte: 19.1 %
WBC: 7 10*3/uL (ref 3.8–10.8)

## 2020-09-20 LAB — COMPREHENSIVE METABOLIC PANEL
AG Ratio: 1.2 (calc) (ref 1.0–2.5)
ALT: 15 U/L (ref 6–29)
AST: 17 U/L (ref 10–35)
Albumin: 4.1 g/dL (ref 3.6–5.1)
Alkaline phosphatase (APISO): 70 U/L (ref 37–153)
BUN/Creatinine Ratio: 13 (calc) (ref 6–22)
BUN: 14 mg/dL (ref 7–25)
CO2: 21 mmol/L (ref 20–32)
Calcium: 11.1 mg/dL — ABNORMAL HIGH (ref 8.6–10.4)
Chloride: 104 mmol/L (ref 98–110)
Creat: 1.11 mg/dL — ABNORMAL HIGH (ref 0.50–0.99)
Globulin: 3.4 g/dL (calc) (ref 1.9–3.7)
Glucose, Bld: 88 mg/dL (ref 65–139)
Potassium: 4.2 mmol/L (ref 3.5–5.3)
Sodium: 133 mmol/L — ABNORMAL LOW (ref 135–146)
Total Bilirubin: 0.6 mg/dL (ref 0.2–1.2)
Total Protein: 7.5 g/dL (ref 6.1–8.1)

## 2020-09-20 LAB — URINE CULTURE
MICRO NUMBER:: 11545805
SPECIMEN QUALITY:: ADEQUATE

## 2020-09-20 LAB — LIPASE: Lipase: 48 U/L (ref 7–60)

## 2020-09-20 LAB — PROTIME-INR
INR: 1
Prothrombin Time: 10 s (ref 9.0–11.5)

## 2020-09-20 LAB — CULTURE INDICATED

## 2020-09-23 DIAGNOSIS — M129 Arthropathy, unspecified: Secondary | ICD-10-CM | POA: Diagnosis not present

## 2020-09-23 DIAGNOSIS — Z79899 Other long term (current) drug therapy: Secondary | ICD-10-CM | POA: Diagnosis not present

## 2020-09-26 ENCOUNTER — Other Ambulatory Visit: Payer: Self-pay

## 2020-09-26 ENCOUNTER — Encounter (HOSPITAL_COMMUNITY): Payer: Self-pay | Admitting: *Deleted

## 2020-09-26 ENCOUNTER — Emergency Department (HOSPITAL_COMMUNITY)
Admission: EM | Admit: 2020-09-26 | Discharge: 2020-09-26 | Disposition: A | Discharge status: Home / Self Care | Payer: Medicare Other | Attending: Emergency Medicine | Admitting: Emergency Medicine

## 2020-09-26 DIAGNOSIS — Z041 Encounter for examination and observation following transport accident: Secondary | ICD-10-CM | POA: Insufficient documentation

## 2020-09-26 DIAGNOSIS — Z5321 Procedure and treatment not carried out due to patient leaving prior to being seen by health care provider: Secondary | ICD-10-CM | POA: Insufficient documentation

## 2020-09-26 DIAGNOSIS — N390 Urinary tract infection, site not specified: Secondary | ICD-10-CM

## 2020-09-27 ENCOUNTER — Encounter (HOSPITAL_COMMUNITY): Payer: Self-pay

## 2020-09-27 ENCOUNTER — Other Ambulatory Visit: Payer: Self-pay

## 2020-09-27 ENCOUNTER — Emergency Department (HOSPITAL_COMMUNITY): Payer: Medicare Other

## 2020-09-27 ENCOUNTER — Emergency Department (HOSPITAL_COMMUNITY)
Admission: EM | Admit: 2020-09-27 | Discharge: 2020-09-27 | Disposition: A | Discharge status: Home / Self Care | Payer: Medicare Other | Attending: Emergency Medicine | Admitting: Emergency Medicine

## 2020-09-27 DIAGNOSIS — Y92410 Unspecified street and highway as the place of occurrence of the external cause: Secondary | ICD-10-CM | POA: Diagnosis not present

## 2020-09-27 DIAGNOSIS — J45909 Unspecified asthma, uncomplicated: Secondary | ICD-10-CM | POA: Insufficient documentation

## 2020-09-27 DIAGNOSIS — I10 Essential (primary) hypertension: Secondary | ICD-10-CM | POA: Insufficient documentation

## 2020-09-27 DIAGNOSIS — Z041 Encounter for examination and observation following transport accident: Secondary | ICD-10-CM | POA: Diagnosis not present

## 2020-09-27 DIAGNOSIS — M542 Cervicalgia: Secondary | ICD-10-CM | POA: Diagnosis not present

## 2020-09-27 DIAGNOSIS — S161XXA Strain of muscle, fascia and tendon at neck level, initial encounter: Secondary | ICD-10-CM | POA: Insufficient documentation

## 2020-09-27 DIAGNOSIS — S39012A Strain of muscle, fascia and tendon of lower back, initial encounter: Secondary | ICD-10-CM | POA: Insufficient documentation

## 2020-09-27 DIAGNOSIS — Z96652 Presence of left artificial knee joint: Secondary | ICD-10-CM | POA: Insufficient documentation

## 2020-09-27 DIAGNOSIS — S0990XA Unspecified injury of head, initial encounter: Secondary | ICD-10-CM | POA: Diagnosis not present

## 2020-09-27 DIAGNOSIS — S3992XA Unspecified injury of lower back, initial encounter: Secondary | ICD-10-CM | POA: Diagnosis present

## 2020-09-27 DIAGNOSIS — F17210 Nicotine dependence, cigarettes, uncomplicated: Secondary | ICD-10-CM | POA: Insufficient documentation

## 2020-09-27 DIAGNOSIS — S3991XA Unspecified injury of abdomen, initial encounter: Secondary | ICD-10-CM | POA: Diagnosis not present

## 2020-09-27 DIAGNOSIS — Z79899 Other long term (current) drug therapy: Secondary | ICD-10-CM | POA: Insufficient documentation

## 2020-09-27 DIAGNOSIS — S299XXA Unspecified injury of thorax, initial encounter: Secondary | ICD-10-CM | POA: Diagnosis not present

## 2020-09-27 LAB — COMPREHENSIVE METABOLIC PANEL
ALT: 17 U/L (ref 0–44)
AST: 17 U/L (ref 15–41)
Albumin: 3.3 g/dL — ABNORMAL LOW (ref 3.5–5.0)
Alkaline Phosphatase: 61 U/L (ref 38–126)
Anion gap: 6 (ref 5–15)
BUN: 5 mg/dL — ABNORMAL LOW (ref 8–23)
CO2: 22 mmol/L (ref 22–32)
Calcium: 9.9 mg/dL (ref 8.9–10.3)
Chloride: 109 mmol/L (ref 98–111)
Creatinine, Ser: 0.76 mg/dL (ref 0.44–1.00)
GFR, Estimated: >60 mL/min (ref >60)
Glucose, Bld: 89 mg/dL (ref 70–99)
Potassium: 3.7 mmol/L (ref 3.5–5.1)
Sodium: 137 mmol/L (ref 135–145)
Total Bilirubin: 0.4 mg/dL (ref 0.3–1.2)
Total Protein: 7 g/dL (ref 6.5–8.1)

## 2020-09-27 LAB — CBC WITH DIFFERENTIAL/PLATELET
Abs Immature Granulocytes: 0.02 10*3/uL (ref 0.00–0.07)
Basophils Absolute: 0 10*3/uL (ref 0.0–0.1)
Basophils Relative: 0 %
Eosinophils Absolute: 0.1 10*3/uL (ref 0.0–0.5)
Eosinophils Relative: 1 %
HCT: 36.7 % (ref 36.0–46.0)
Hemoglobin: 11.7 g/dL — ABNORMAL LOW (ref 12.0–15.0)
Immature Granulocytes: 0 %
Lymphocytes Relative: 16 %
Lymphs Abs: 1.2 10*3/uL (ref 0.7–4.0)
MCH: 31.1 pg (ref 26.0–34.0)
MCHC: 31.9 g/dL (ref 30.0–36.0)
MCV: 97.6 fL (ref 80.0–100.0)
Monocytes Absolute: 0.6 10*3/uL (ref 0.1–1.0)
Monocytes Relative: 8 %
Neutro Abs: 5.6 10*3/uL (ref 1.7–7.7)
Neutrophils Relative %: 75 %
Platelets: 274 10*3/uL (ref 150–400)
RBC: 3.76 MIL/uL — ABNORMAL LOW (ref 3.87–5.11)
RDW: 14.2 % (ref 11.5–15.5)
WBC: 7.4 10*3/uL (ref 4.0–10.5)
nRBC: 0 % (ref 0.0–0.2)

## 2020-09-27 MED ORDER — OXYCODONE-ACETAMINOPHEN 5-325 MG PO TABS: 1.0000 | ORAL_TABLET | Freq: Three times a day (TID) | ORAL | 0 refills | Status: DC | PRN

## 2020-09-27 NOTE — ED Notes (Signed)
Informed charge nurse pt states they always have to use and [place].  This nurse looked for an IV site for 15 minutes.  Pt has very small veins unable to establish IV at this time.
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
## 2020-09-27 NOTE — ED Notes (Signed)
Attempted ultrasound IV with no success.

## 2020-09-27 NOTE — Discharge Instructions (Addendum)
Follow-up with your family doctor if not improving.  Take the pain medicine if Tylenol alone does not help with the discomfort
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
## 2020-10-03 DIAGNOSIS — G894 Chronic pain syndrome: Secondary | ICD-10-CM | POA: Diagnosis not present

## 2020-10-03 DIAGNOSIS — M5090 Cervical disc disorder, unspecified, unspecified cervical region: Secondary | ICD-10-CM | POA: Diagnosis not present

## 2020-10-03 DIAGNOSIS — F17210 Nicotine dependence, cigarettes, uncomplicated: Secondary | ICD-10-CM | POA: Diagnosis not present

## 2020-10-03 DIAGNOSIS — F17200 Nicotine dependence, unspecified, uncomplicated: Secondary | ICD-10-CM | POA: Diagnosis not present

## 2020-10-03 DIAGNOSIS — Z79899 Other long term (current) drug therapy: Secondary | ICD-10-CM | POA: Diagnosis not present

## 2020-10-06 DIAGNOSIS — N390 Urinary tract infection, site not specified: Secondary | ICD-10-CM | POA: Diagnosis not present

## 2020-10-06 DIAGNOSIS — R319 Hematuria, unspecified: Secondary | ICD-10-CM | POA: Diagnosis not present

## 2020-10-07 LAB — URINE CULTURE
MICRO NUMBER:: 11615940
SPECIMEN QUALITY:: ADEQUATE

## 2020-10-09 DIAGNOSIS — Z0001 Encounter for general adult medical examination with abnormal findings: Secondary | ICD-10-CM | POA: Diagnosis not present

## 2020-10-09 DIAGNOSIS — F17210 Nicotine dependence, cigarettes, uncomplicated: Secondary | ICD-10-CM | POA: Diagnosis not present

## 2020-10-09 DIAGNOSIS — K869 Disease of pancreas, unspecified: Secondary | ICD-10-CM | POA: Diagnosis not present

## 2020-10-09 DIAGNOSIS — Z1389 Encounter for screening for other disorder: Secondary | ICD-10-CM | POA: Diagnosis not present

## 2020-10-09 DIAGNOSIS — J449 Chronic obstructive pulmonary disease, unspecified: Secondary | ICD-10-CM | POA: Diagnosis not present

## 2020-10-09 DIAGNOSIS — I10 Essential (primary) hypertension: Secondary | ICD-10-CM | POA: Diagnosis not present

## 2020-10-21 ENCOUNTER — Emergency Department (HOSPITAL_COMMUNITY)
Admission: EM | Admit: 2020-10-21 | Discharge: 2020-10-21 | Disposition: A | Discharge status: Home / Self Care | Payer: Medicare Other | Attending: Emergency Medicine | Admitting: Emergency Medicine

## 2020-10-21 ENCOUNTER — Encounter (HOSPITAL_COMMUNITY): Payer: Self-pay

## 2020-10-21 ENCOUNTER — Emergency Department (HOSPITAL_COMMUNITY): Payer: Medicare Other

## 2020-10-21 ENCOUNTER — Other Ambulatory Visit: Payer: Self-pay

## 2020-10-21 DIAGNOSIS — R34 Anuria and oliguria: Secondary | ICD-10-CM | POA: Diagnosis not present

## 2020-10-21 DIAGNOSIS — Z96652 Presence of left artificial knee joint: Secondary | ICD-10-CM | POA: Insufficient documentation

## 2020-10-21 DIAGNOSIS — F17210 Nicotine dependence, cigarettes, uncomplicated: Secondary | ICD-10-CM | POA: Diagnosis not present

## 2020-10-21 DIAGNOSIS — R109 Unspecified abdominal pain: Secondary | ICD-10-CM | POA: Diagnosis present

## 2020-10-21 DIAGNOSIS — J45909 Unspecified asthma, uncomplicated: Secondary | ICD-10-CM | POA: Insufficient documentation

## 2020-10-21 DIAGNOSIS — I10 Essential (primary) hypertension: Secondary | ICD-10-CM | POA: Insufficient documentation

## 2020-10-21 DIAGNOSIS — Z79899 Other long term (current) drug therapy: Secondary | ICD-10-CM | POA: Diagnosis not present

## 2020-10-21 DIAGNOSIS — K219 Gastro-esophageal reflux disease without esophagitis: Secondary | ICD-10-CM | POA: Insufficient documentation

## 2020-10-21 DIAGNOSIS — K5900 Constipation, unspecified: Secondary | ICD-10-CM | POA: Insufficient documentation

## 2020-10-21 LAB — COMPREHENSIVE METABOLIC PANEL
ALT: 14 U/L (ref 0–44)
AST: 20 U/L (ref 15–41)
Albumin: 3.4 g/dL — ABNORMAL LOW (ref 3.5–5.0)
Alkaline Phosphatase: 76 U/L (ref 38–126)
Anion gap: 9 (ref 5–15)
BUN: 8 mg/dL (ref 8–23)
CO2: 23 mmol/L (ref 22–32)
Calcium: 10.3 mg/dL (ref 8.9–10.3)
Chloride: 105 mmol/L (ref 98–111)
Creatinine, Ser: 0.71 mg/dL (ref 0.44–1.00)
GFR, Estimated: >60 mL/min (ref >60)
Glucose, Bld: 94 mg/dL (ref 70–99)
Potassium: 3.9 mmol/L (ref 3.5–5.1)
Sodium: 137 mmol/L (ref 135–145)
Total Bilirubin: 0.6 mg/dL (ref 0.3–1.2)
Total Protein: 7.3 g/dL (ref 6.5–8.1)

## 2020-10-21 LAB — CBC WITH DIFFERENTIAL/PLATELET
Abs Immature Granulocytes: 0.04 10*3/uL (ref 0.00–0.07)
Basophils Absolute: 0 10*3/uL (ref 0.0–0.1)
Basophils Relative: 0 %
Eosinophils Absolute: 0.1 10*3/uL (ref 0.0–0.5)
Eosinophils Relative: 1 %
HCT: 38.5 % (ref 36.0–46.0)
Hemoglobin: 12.1 g/dL (ref 12.0–15.0)
Immature Granulocytes: 0 %
Lymphocytes Relative: 14 %
Lymphs Abs: 1.3 10*3/uL (ref 0.7–4.0)
MCH: 31.1 pg (ref 26.0–34.0)
MCHC: 31.4 g/dL (ref 30.0–36.0)
MCV: 99 fL (ref 80.0–100.0)
Monocytes Absolute: 1 10*3/uL (ref 0.1–1.0)
Monocytes Relative: 11 %
Neutro Abs: 6.8 10*3/uL (ref 1.7–7.7)
Neutrophils Relative %: 74 %
Platelets: 221 10*3/uL (ref 150–400)
RBC: 3.89 MIL/uL (ref 3.87–5.11)
RDW: 15.1 % (ref 11.5–15.5)
WBC: 9.2 10*3/uL (ref 4.0–10.5)
nRBC: 0 % (ref 0.0–0.2)

## 2020-10-21 LAB — URINALYSIS, ROUTINE W REFLEX MICROSCOPIC
Bilirubin Urine: NEGATIVE
Glucose, UA: NEGATIVE mg/dL
Hgb urine dipstick: NEGATIVE
Ketones, ur: NEGATIVE mg/dL
Leukocytes,Ua: NEGATIVE
Nitrite: NEGATIVE
Protein, ur: NEGATIVE mg/dL
Specific Gravity, Urine: 1.006 (ref 1.005–1.030)
pH: 6 (ref 5.0–8.0)

## 2020-10-21 LAB — LIPASE, BLOOD: Lipase: 22 U/L (ref 11–51)

## 2020-10-21 MED ORDER — POLYETHYLENE GLYCOL 3350 17 GM/SCOOP PO POWD: ORAL | 0 refills | Status: DC

## 2020-10-21 NOTE — ED Notes (Signed)
Fleets enema given, passed hard stool

## 2020-10-21 NOTE — ED Triage Notes (Signed)
Pt presents to ED with complaints of constipation x 3 days. Pt states she has used milk of mag, fleets enema and Epson salt without relief.

## 2020-10-21 NOTE — ED Provider Notes (Signed)
[facility] EMERGENCY DEPARTMENT Provider Note   CSN: [id number] Arrival date & time: 10/21/20  0947     History Chief Complaint  Patient presents with  . Constipation    Colleen LIEVANOS is a 65 y.o. female presenting for evaluation of constipation and abdominal pain.   Pt states she has had issues with constipation for the past 3 days.  She has had 1 very small bowel movement, but was not able to completely evacuate.  Since constipation began, she has developed abdominal pain and pain of the rectum.  She denies fevers, chills, chest pain, shortness of breath, cough, nausea vomiting.  She reports decreased urination, states she is urinating available, however having to do so frequently.  No dysuria or hematuria.  She does report a history of constipation the past, but states she is normally able to treated with over-the-counter medication.  This is different than her normal constipation.  She is on chronic pain medication, no change in that recently.  Additional history obtained in chart review.  Patient with a history of chronic pain, cirrhosis, fibromyalgia, GERD, hypertension, chronic pancreatitis due to alcohol use, cirrhosis  HPI     Past Medical History:  Diagnosis Date  . Asthma   . Chronic abdominal pain   . Chronic back pain   . Chronic constipation   . Cirrhosis ([place])    Metavir score F4 on elastography 2015  . Cyclical vomiting   . Fibroids   . Fibromyalgia   . GERD (gastroesophageal reflux disease)   . H. pylori infection 2014   treated with pylera, had to be treated again as it was not eradicated. Urea breath test then negative after subsequent treatment.   . [name]    HCV RNA positive 09/2012  . Hypertension   . Marijuana use   . Nausea and vomiting    chronic, recurrent  . PONV (postoperative nausea and vomiting)    pt thinks maybe once she had N&V  . Sciatica of left side     Patient Active Problem List   Diagnosis Date Noted  . Dysuria 09/16/2020   . Acute pancreatitis 10/17/2019  . ETOH abuse 10/17/2019  . S/P right rotator cuff repair 01/30/19 02/06/2019  . Nontraumatic complete tear of right rotator cuff   . Arthritis of right acromioclavicular joint   . S/P total knee replacement, 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/24/2018  . Shoulder impingement, right 05/24/2018  . Dehydration   . Intractable cyclical vomiting 09/62/8366  . Pyloric stenosis in adult   . Thrombocytopenia ([place]) 12/20/2017  . Leukocytosis   . Malnutrition of moderate degree 12/17/2017  . Acute renal failure (ARF) ([place]) 12/15/2017  . Chronic abdominal pain 12/15/2017  . Gastroenteritis 06/14/2016  . Nausea with vomiting 06/10/2016  . Uterine enlargement 06/09/2016  . Intractable nausea and vomiting 06/08/2016  . Acute infective gastroenteritis 06/08/2016  . Diarrhea 06/08/2016  . Essential hypertension 06/08/2016  . GERD (gastroesophageal reflux disease) 06/08/2016  . Abdominal pain   . Endometrial polyp 04/15/2016  . PMB (postmenopausal bleeding) 02/27/2016  . Alcoholic cirrhosis of liver without ascites ([place])   . Asthma 01/21/2016  . Hepatic cirrhosis ([place]) 09/22/2015  . Arthritis of knee, degenerative 02/05/2015  . History of Helicobacter pylori infection 10/30/2014  . Chronic [name] with cirrhosis ([place]) 04/11/2014  . De Quervain's disease (radial styloid tenosynovitis) 12/25/2013  . Anorexia 11/21/2012  . FH: colon cancer 11/21/2012  . Early satiety 10/25/2012  . Bowel habit changes 10/25/2012  . Abdominal pain,  epigastric 10/25/2012  . Abdominal bloating 10/25/2012  . Constipation 10/25/2012  . Abnormal weight loss 10/25/2012  . Chronic viral [name] ([place]) 10/25/2012  . Radicular pain of left lower extremity 09/28/2012  . Back pain 09/28/2012  . Sciatica 08/10/2011  . S/P arthroscopy of left knee 08/10/2011  . Tibial plateau fracture 08/10/2011  . Pain in joint, lower leg 02/12/2011  . Stiffness of joint, not elsewhere classified, lower leg  02/12/2011  . 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 Meniscus, medial, derangement 12/29/2010  . CLOSED FRACTURE OF UPPER END OF TIBIA 08/12/2010    Past Surgical History:  Procedure Laterality Date  . BALLOON DILATION  12/20/2017   Procedure: BALLOON DILATION;  Surgeon: Danie Binder, MD;  Location: [facility];  Service: Endoscopy;;  pyloric dilation  . BIOPSY  12/20/2017   Procedure: BIOPSY;  Surgeon: Danie Binder, MD;  Location: [facility];  Service: Endoscopy;;  duodenal and gastric biopsy  . COLONOSCOPY  01/18/2008   QVZ:DGLOVF rectum.  Long redundant colon, a diminutive sigmoid polyp status post cold biopsy removed. Hyperplastic polyp. Repeat colonoscopy June 2014 due to family history of colon cancer  . COLONOSCOPY WITH ESOPHAGOGASTRODUODENOSCOPY (EGD) N/A 11/02/2012   IEP:PIRJJOAC gastric mucosa of doubtful, +H.pylori. Incomplete colonoscopy due to patient unable to tolerate exam, proximal colon seen. Patient refused ACBE.  Marland Kitchen COLONOSCOPY WITH PROPOFOL N/A 03/08/2013   Dr. Gala Romney: colonic polyp-removed as scribed above. Internal Hemorrhoids. Pathology did not reveal any colonic tissue, only mucus. SURVEILLANCE DUE Aug 2019  . COLONOSCOPY WITH PROPOFOL N/A 01/19/2018   Dr. Gala Romney: 6 mm cecal inflammatoy polyp, otherwise normal., Surveilance in 5 year s  . ESOPHAGEAL DILATION  12/20/2017   Procedure: ESOPHAGEAL DILATION;  Surgeon: Danie Binder, MD;  Location: [facility];  Service: Endoscopy;;  . ESOPHAGOGASTRODUODENOSCOPY  01/18/2008   RMR: Normal esophagus, normal  stomach  . ESOPHAGOGASTRODUODENOSCOPY (EGD) WITH PROPOFOL N/A 02/09/2016   Normal esophagus, small hiatal hernia, portal hypertensive gastropathy, normal second portion of duodenum. Due in July 2019  . ESOPHAGOGASTRODUODENOSCOPY (EGD) WITH PROPOFOL N/A 12/20/2017   Dr. Oneida Alar: benign appearing esophageal stenosis s/p dilation, mild pyloric stenosis s/p biopsy and dilation, mild duodenitis.   Marland Kitchen  ESOPHAGOGASTRODUODENOSCOPY (EGD) WITH PROPOFOL N/A 06/12/2020   Rourk: Normal esophagus, portal hypertensive gastropathy, no specimens collected.  Tentatively plan for repeat EGD in 2 years.  Marland Kitchen HYSTEROSCOPY  05/11/2016   Procedure: HYSTEROSCOPY;  Surgeon: Jonnie Kind, MD;  Location: [facility];  Service: Gynecology;;  . KNEE SURGERY     left knee  . MULTIPLE EXTRACTIONS WITH ALVEOLOPLASTY N/A 10/15/2013   Procedure: MULTIPLE EXTRACTION WITH ALVEOLOPLASTY;  Surgeon: Gae Bon, DDS;  Location: [place];  Service: Oral Surgery;  Laterality: N/A;  . POLYPECTOMY N/A 03/08/2013   Procedure: POLYPECTOMY;  Surgeon: Daneil Dolin, MD;  Location: [facility];  Service: Endoscopy;  Laterality: N/A;  . POLYPECTOMY N/A 05/11/2016   Procedure: REMOVAL OF ENDOMETRIAL POLYP;  Surgeon: Jonnie Kind, MD;  Location: [facility];  Service: Gynecology;  Laterality: N/A;  . RESECTION DISTAL CLAVICAL Right 01/30/2019   Procedure: RESECTION DISTAL CLAVICAL;  Surgeon: Carole Civil, MD;  Location: [facility];  Service: Orthopedics;  Laterality: Right;  . SHOULDER OPEN ROTATOR CUFF REPAIR Right 01/30/2019   Procedure: ROTATOR CUFF REPAIR SHOULDER OPEN;  Surgeon: Carole Civil, MD;  Location: [facility];  Service: Orthopedics;  Laterality: Right;  pt to arrive at 7:30 for PICC at 8:00  . TOTAL KNEE ARTHROPLASTY  Left 02/05/2015   Procedure: LEFT TOTAL KNEE ARTHROPLASTY;  Surgeon: Carole Civil, MD;  Location: [facility];  Service: Orthopedics;  Laterality: Left;     OB History    Gravida      Para      Term      Preterm      AB      Living  1     SAB      IAB      Ectopic      Multiple      Live Births              Family History  Problem Relation Age of Onset  . Colon cancer Brother 83          . Multiple myeloma Brother   . Liver cancer Sister   . Prostate cancer Brother   . Pancreatic cancer Brother   . Cancer Mother        breast  . Asthma Mother     Social History   Tobacco Use  .  Smoking status: Current Every Day Smoker    Packs/day: 1.00    Years: 40.00    Pack years: 40.00    Types: Cigarettes  . Smokeless tobacco: Never Used  . Tobacco comment: Smokes one pack of cigarettes every 1.5 days  Vaping Use  . Vaping Use: Never used  Substance Use Topics  . Alcohol use: Yes    Alcohol/week: 0.0 standard drinks    Comment: 1 bottle of dinner wine daily  . Drug use: Not Currently    Types: Marijuana    Comment: history of marijuana in past- almost 2 years ago    Home Medications Prior to Admission medications   Medication Sig Start Date End Date Taking? Authorizing Provider  polyethylene glycol powder (MIRALAX) 17 GM/SCOOP powder Use 1-2 capfuls in 8 oz of liquid up to 3 times a day until you are having regular bowel movements 10/21/20  Yes Caccavale, Sophia, PA-C  albuterol (PROVENTIL HFA;VENTOLIN HFA) 108 (90 Base) MCG/ACT inhaler Inhale 2 puffs into the lungs every 6 (six) hours as needed for wheezing or shortness of breath.     [provider]  Cyanocobalamin (B-12) 2500 MCG TABS Take 2,500 mcg by mouth daily.    [provider]  cyclobenzaprine (FLEXERIL) 5 MG tablet Take 1 tablet (5 mg total) by mouth 2 (two) times daily as needed for muscle spasms. Patient taking differently: Take 5 mg by mouth daily. 12/07/19   Carole Civil, MD  gabapentin (NEURONTIN) 100 MG capsule Take 1 capsule (100 mg total) by mouth 3 (three) times daily. Patient taking differently: Take 100 mg by mouth at bedtime. 12/22/18   Carole Civil, MD  hydrALAZINE (APRESOLINE) 50 MG tablet Take 1 tablet (50 mg total) by mouth every 8 (eight) hours. 01/30/18   Barton Dubois, MD  hydrOXYzine (ATARAX/VISTARIL) 10 MG tablet Take 10 mg by mouth daily.  11/30/17   [provider]  labetalol (NORMODYNE) 100 MG tablet Take 2 tablets (200 mg total) by mouth 2 (two) times daily. Patient taking differently: Take 100 mg by mouth in the morning. 01/30/18   Barton Dubois, MD   megestrol (MEGACE) 40 MG tablet Take 40 mg by mouth daily.    [provider]  ondansetron (ZOFRAN) 4 MG tablet Take 4 mg by mouth 2 (two) times daily. 12/28/19   [provider]  oxyCODONE-acetaminophen (PERCOCET) 5-325 MG tablet Take 1 tablet by mouth  every 8 (eight) hours as needed. 09/27/20   Milton Ferguson, MD  pantoprazole (PROTONIX) 40 MG tablet Take 1 tablet (40 mg total) by mouth daily. 30 minutes before breakfast 09/07/19   Annitta Needs, NP  potassium chloride (MICRO-K) 10 MEQ CR capsule Take 20 mEq by mouth daily.    [provider]    Allergies    Penicillins  Review of Systems   Review of Systems  Gastrointestinal: Positive for abdominal pain and constipation.  Genitourinary: Positive for difficulty urinating.  All other systems reviewed and are negative.   Physical Exam Updated Vital Signs BP 120/83   Pulse 98   Temp 98.2 F (36.8 C)   Resp 20   Ht $R'5\' 6"'xi$  (1.676 m)   Wt 83 kg   SpO2 99%   BMI 29.54 kg/m   Physical Exam Vitals and nursing note reviewed. Exam conducted with a chaperone present.  Constitutional:      General: She is not in acute distress.    Appearance: She is well-developed.     Comments: Resting in the bed in NAD  HENT:     Head: Normocephalic and atraumatic.  Eyes:     Conjunctiva/sclera: Conjunctivae normal.     Pupils: Pupils are equal, round, and reactive to light.  Cardiovascular:     Rate and Rhythm: Normal rate and regular rhythm.     Pulses: Normal pulses.  Pulmonary:     Effort: Pulmonary effort is normal. No respiratory distress.     Breath sounds: Normal breath sounds. No wheezing.  Abdominal:     General: There is distension.     Palpations: Abdomen is soft. There is no mass.     Tenderness: There is abdominal tenderness. There is no guarding or rebound.     Comments: Abdomen distended, however soft.  There is palpation of the lower abdomen, but no TTP of the upper abdomen.  Genitourinary:     Comments: Minimal soft stool noted in the rectal vault.  No obvious fissure, mass, or bleeding. Musculoskeletal:        General: Normal range of motion.     Cervical back: Normal range of motion and neck supple.  Skin:    General: Skin is warm and dry.     Capillary Refill: Capillary refill takes less than 2 seconds.  Neurological:     Mental Status: She is alert and oriented to person, place, and time.     ED Results / Procedures / Treatments   Labs (all labs ordered are listed, but only abnormal results are displayed) Labs Reviewed  COMPREHENSIVE METABOLIC PANEL - Abnormal; Notable for the following components:      Result Value   Albumin 3.4 (*)    All other components within normal limits  CBC WITH DIFFERENTIAL/PLATELET  LIPASE, BLOOD  URINALYSIS, ROUTINE W REFLEX MICROSCOPIC    EKG None  Radiology No results found.  Procedures Procedures   Medications Ordered in ED Medications - No data to display  ED Course  I have reviewed the triage vital signs and the nursing notes.  Pertinent labs & imaging results that were available during my care of the patient were reviewed by me and considered in my medical decision making (see chart for details).    MDM Rules/Calculators/A&P                          Pt presenting for evaluation of constipation and abd pain. On exam,  pt appears nontoxic. She has a mildly distended abd, but also has h/o cirrhosis.  Tenderness palpation only lower abdomen, which is consistent constipation.  She is not having fevers, nausea, vomiting, doubt intra-abdominal infection.  Rectal exam without obvious impaction.  She is passing gas, as such, constipation is not likely due to a bowel obstruction.  However considering her significant history, will obtain labs and a CT.  Will give enema.  Labs interpreted by me, overall reassuring.  Without leukocytosis or acute abnormality of the labs, once again doubt infection.  UA without signs of infection.   After fleets enema, patient passed hard stool.  On reassessment, she states she is feeling much better.  I offered to continue with plan for CT versus symptomatic management at home, patient elects management at home.  I discussed that she will need to take medication for several days before symptoms completely resolved.  Encouraged her to continue using enemas as needed.  Encouraged her to follow-up with primary care.  Discussed dietary changes including high-fiber and decreased fluid intake.  At this time, patient appears safe for discharge.  Return precautions given.  Patient states she understands and agrees to plan.  Final Clinical Impression(s) / ED Diagnoses Final diagnoses:  Constipation, unspecified constipation type    Rx / DC Orders ED Discharge Orders         Ordered    polyethylene glycol powder (MIRALAX) 17 GM/SCOOP powder        10/21/20 1755           Caccavale, Sophia, PA-C 10/21/20 1805    Sherwood Gambler, MD 10/24/20 [phone]

## 2020-10-21 NOTE — Discharge Instructions (Signed)
Continue taking home medication as prescribed. Continue using the stool softener while you are using pain medicine. Take MiraLAX as prescribed to help encourage bowel movements. You may continue using enemas as needed until you are having complete bowel movements. Follow-up with your primary care doctor for further evaluation of your symptoms. Make sure you stay well-hydrated with water, as this will decrease your risk for constipation. Eat a high-fiber diet. Return to emergency room if develop high fevers, vomiting, severe worsening pain, or any new, worsening, or concerning symptoms.

## 2020-10-24 ENCOUNTER — Other Ambulatory Visit: Payer: Self-pay | Admitting: Gastroenterology

## 2020-10-27 ENCOUNTER — Other Ambulatory Visit: Payer: Self-pay | Admitting: Gastroenterology

## 2020-11-04 DIAGNOSIS — Z79899 Other long term (current) drug therapy: Secondary | ICD-10-CM | POA: Diagnosis not present

## 2020-11-04 DIAGNOSIS — G894 Chronic pain syndrome: Secondary | ICD-10-CM | POA: Diagnosis not present

## 2020-11-04 DIAGNOSIS — M5090 Cervical disc disorder, unspecified, unspecified cervical region: Secondary | ICD-10-CM | POA: Diagnosis not present

## 2020-11-04 DIAGNOSIS — F17210 Nicotine dependence, cigarettes, uncomplicated: Secondary | ICD-10-CM | POA: Diagnosis not present

## 2020-11-09 DIAGNOSIS — K219 Gastro-esophageal reflux disease without esophagitis: Secondary | ICD-10-CM | POA: Diagnosis not present

## 2020-11-09 DIAGNOSIS — I10 Essential (primary) hypertension: Secondary | ICD-10-CM | POA: Diagnosis not present

## 2020-11-11 ENCOUNTER — Ambulatory Visit: Payer: Medicare Other | Admitting: Gastroenterology

## 2020-12-04 ENCOUNTER — Encounter: Payer: Self-pay | Admitting: Internal Medicine

## 2020-12-04 ENCOUNTER — Other Ambulatory Visit: Payer: Self-pay | Admitting: Orthopedic Surgery

## 2020-12-04 DIAGNOSIS — Z79899 Other long term (current) drug therapy: Secondary | ICD-10-CM | POA: Diagnosis not present

## 2020-12-04 DIAGNOSIS — M5090 Cervical disc disorder, unspecified, unspecified cervical region: Secondary | ICD-10-CM | POA: Diagnosis not present

## 2020-12-04 DIAGNOSIS — G894 Chronic pain syndrome: Secondary | ICD-10-CM | POA: Diagnosis not present

## 2020-12-04 DIAGNOSIS — F17210 Nicotine dependence, cigarettes, uncomplicated: Secondary | ICD-10-CM | POA: Diagnosis not present

## 2020-12-04 DIAGNOSIS — M542 Cervicalgia: Secondary | ICD-10-CM

## 2020-12-04 DIAGNOSIS — I10 Essential (primary) hypertension: Secondary | ICD-10-CM | POA: Diagnosis not present

## 2020-12-09 DIAGNOSIS — K219 Gastro-esophageal reflux disease without esophagitis: Secondary | ICD-10-CM | POA: Diagnosis not present

## 2020-12-09 DIAGNOSIS — I10 Essential (primary) hypertension: Secondary | ICD-10-CM | POA: Diagnosis not present

## 2020-12-12 DIAGNOSIS — M79605 Pain in left leg: Secondary | ICD-10-CM | POA: Diagnosis not present

## 2020-12-12 DIAGNOSIS — R0989 Other specified symptoms and signs involving the circulatory and respiratory systems: Secondary | ICD-10-CM | POA: Diagnosis not present

## 2020-12-12 DIAGNOSIS — F17210 Nicotine dependence, cigarettes, uncomplicated: Secondary | ICD-10-CM | POA: Diagnosis not present

## 2020-12-12 DIAGNOSIS — I10 Essential (primary) hypertension: Secondary | ICD-10-CM | POA: Diagnosis not present

## 2020-12-12 DIAGNOSIS — E7800 Pure hypercholesterolemia, unspecified: Secondary | ICD-10-CM | POA: Diagnosis not present

## 2020-12-12 DIAGNOSIS — Z131 Encounter for screening for diabetes mellitus: Secondary | ICD-10-CM | POA: Diagnosis not present

## 2020-12-12 DIAGNOSIS — M79604 Pain in right leg: Secondary | ICD-10-CM | POA: Diagnosis not present

## 2020-12-12 DIAGNOSIS — Z79899 Other long term (current) drug therapy: Secondary | ICD-10-CM | POA: Diagnosis not present

## 2020-12-12 DIAGNOSIS — R5383 Other fatigue: Secondary | ICD-10-CM | POA: Diagnosis not present

## 2020-12-12 DIAGNOSIS — M129 Arthropathy, unspecified: Secondary | ICD-10-CM | POA: Diagnosis not present

## 2020-12-24 ENCOUNTER — Ambulatory Visit: Payer: Medicare Other | Admitting: Gastroenterology

## 2021-01-01 DIAGNOSIS — G894 Chronic pain syndrome: Secondary | ICD-10-CM | POA: Diagnosis not present

## 2021-01-01 DIAGNOSIS — F17210 Nicotine dependence, cigarettes, uncomplicated: Secondary | ICD-10-CM | POA: Diagnosis not present

## 2021-01-01 DIAGNOSIS — R030 Elevated blood-pressure reading, without diagnosis of hypertension: Secondary | ICD-10-CM | POA: Diagnosis not present

## 2021-01-01 DIAGNOSIS — M5090 Cervical disc disorder, unspecified, unspecified cervical region: Secondary | ICD-10-CM | POA: Diagnosis not present

## 2021-01-01 DIAGNOSIS — Z79899 Other long term (current) drug therapy: Secondary | ICD-10-CM | POA: Diagnosis not present

## 2021-01-03 ENCOUNTER — Inpatient Hospital Stay (HOSPITAL_COMMUNITY)
Admission: EM | Admit: 2021-01-03 | Discharge: 2021-01-05 | DRG: 439 | Disposition: A | Discharge status: Home / Self Care | Payer: Medicare Other | Attending: Internal Medicine | Admitting: Internal Medicine

## 2021-01-03 ENCOUNTER — Emergency Department (HOSPITAL_COMMUNITY): Payer: Medicare Other

## 2021-01-03 ENCOUNTER — Other Ambulatory Visit: Payer: Self-pay

## 2021-01-03 ENCOUNTER — Encounter (HOSPITAL_COMMUNITY): Payer: Self-pay | Admitting: *Deleted

## 2021-01-03 DIAGNOSIS — Z20822 Contact with and (suspected) exposure to covid-19: Secondary | ICD-10-CM | POA: Diagnosis present

## 2021-01-03 DIAGNOSIS — K219 Gastro-esophageal reflux disease without esophagitis: Secondary | ICD-10-CM | POA: Diagnosis not present

## 2021-01-03 DIAGNOSIS — K852 Alcohol induced acute pancreatitis without necrosis or infection: Principal | ICD-10-CM | POA: Diagnosis present

## 2021-01-03 DIAGNOSIS — R188 Other ascites: Secondary | ICD-10-CM | POA: Diagnosis not present

## 2021-01-03 DIAGNOSIS — M797 Fibromyalgia: Secondary | ICD-10-CM | POA: Diagnosis present

## 2021-01-03 DIAGNOSIS — Z79899 Other long term (current) drug therapy: Secondary | ICD-10-CM

## 2021-01-03 DIAGNOSIS — N390 Urinary tract infection, site not specified: Secondary | ICD-10-CM | POA: Diagnosis not present

## 2021-01-03 DIAGNOSIS — J45909 Unspecified asthma, uncomplicated: Secondary | ICD-10-CM | POA: Diagnosis present

## 2021-01-03 DIAGNOSIS — Z880 Allergy status to penicillin: Secondary | ICD-10-CM | POA: Diagnosis not present

## 2021-01-03 DIAGNOSIS — F17210 Nicotine dependence, cigarettes, uncomplicated: Secondary | ICD-10-CM | POA: Diagnosis present

## 2021-01-03 DIAGNOSIS — Z825 Family history of asthma and other chronic lower respiratory diseases: Secondary | ICD-10-CM | POA: Diagnosis not present

## 2021-01-03 DIAGNOSIS — J4530 Mild persistent asthma, uncomplicated: Secondary | ICD-10-CM | POA: Diagnosis not present

## 2021-01-03 DIAGNOSIS — K859 Acute pancreatitis without necrosis or infection, unspecified: Secondary | ICD-10-CM | POA: Diagnosis present

## 2021-01-03 DIAGNOSIS — I10 Essential (primary) hypertension: Secondary | ICD-10-CM | POA: Diagnosis not present

## 2021-01-03 DIAGNOSIS — I700 Atherosclerosis of aorta: Secondary | ICD-10-CM | POA: Diagnosis not present

## 2021-01-03 DIAGNOSIS — K7460 Unspecified cirrhosis of liver: Secondary | ICD-10-CM | POA: Diagnosis not present

## 2021-01-03 DIAGNOSIS — Z96652 Presence of left artificial knee joint: Secondary | ICD-10-CM | POA: Diagnosis not present

## 2021-01-03 DIAGNOSIS — K7030 Alcoholic cirrhosis of liver without ascites: Secondary | ICD-10-CM | POA: Diagnosis present

## 2021-01-03 DIAGNOSIS — K8500 Idiopathic acute pancreatitis without necrosis or infection: Secondary | ICD-10-CM | POA: Diagnosis not present

## 2021-01-03 DIAGNOSIS — B182 Chronic viral hepatitis C: Secondary | ICD-10-CM | POA: Diagnosis present

## 2021-01-03 LAB — COMPREHENSIVE METABOLIC PANEL
ALT: 15 U/L (ref 0–44)
AST: 17 U/L (ref 15–41)
Albumin: 3.9 g/dL (ref 3.5–5.0)
Alkaline Phosphatase: 84 U/L (ref 38–126)
Anion gap: 5 (ref 5–15)
BUN: 8 mg/dL (ref 8–23)
CO2: 26 mmol/L (ref 22–32)
Calcium: 11.1 mg/dL — ABNORMAL HIGH (ref 8.9–10.3)
Chloride: 105 mmol/L (ref 98–111)
Creatinine, Ser: 0.79 mg/dL (ref 0.44–1.00)
GFR, Estimated: >60 mL/min (ref >60)
Glucose, Bld: 102 mg/dL — ABNORMAL HIGH (ref 70–99)
Potassium: 4 mmol/L (ref 3.5–5.1)
Sodium: 136 mmol/L (ref 135–145)
Total Bilirubin: 0.8 mg/dL (ref 0.3–1.2)
Total Protein: 8 g/dL (ref 6.5–8.1)

## 2021-01-03 LAB — CBC
HCT: 42.9 % (ref 36.0–46.0)
Hemoglobin: 14.1 g/dL (ref 12.0–15.0)
MCH: 31.9 pg (ref 26.0–34.0)
MCHC: 32.9 g/dL (ref 30.0–36.0)
MCV: 97.1 fL (ref 80.0–100.0)
Platelets: 258 10*3/uL (ref 150–400)
RBC: 4.42 MIL/uL (ref 3.87–5.11)
RDW: 14.2 % (ref 11.5–15.5)
WBC: 9.3 10*3/uL (ref 4.0–10.5)
nRBC: 0 % (ref 0.0–0.2)

## 2021-01-03 LAB — URINALYSIS, ROUTINE W REFLEX MICROSCOPIC
Bilirubin Urine: NEGATIVE
Glucose, UA: NEGATIVE mg/dL
Ketones, ur: 5 mg/dL — AB
Nitrite: POSITIVE — AB
Protein, ur: 30 mg/dL — AB
Specific Gravity, Urine: 1.026 (ref 1.005–1.030)
pH: 5 (ref 5.0–8.0)

## 2021-01-03 LAB — LIPASE, BLOOD: Lipase: 263 U/L — ABNORMAL HIGH (ref 11–51)

## 2021-01-03 LAB — MAGNESIUM: Magnesium: 1.9 mg/dL (ref 1.7–2.4)

## 2021-01-03 LAB — PHOSPHORUS: Phosphorus: 2.7 mg/dL (ref 2.5–4.6)

## 2021-01-03 LAB — ETHANOL: Alcohol, Ethyl (B): <10 mg/dL (ref <10)

## 2021-01-03 MED ORDER — PANTOPRAZOLE SODIUM 40 MG IV SOLR
40.0000 mg | INTRAVENOUS | Status: DC
  Administered 2021-01-03 – 2021-01-04 (×2): 40 mg via INTRAVENOUS
  Filled 2021-01-03 (×2): qty 40

## 2021-01-03 MED ORDER — ADULT MULTIVITAMIN W/MINERALS CH
1.0000 | ORAL_TABLET | Freq: Every day | ORAL | Status: DC
  Administered 2021-01-03 – 2021-01-05 (×3): 1 via ORAL
  Filled 2021-01-03 (×3): qty 1

## 2021-01-03 MED ORDER — HYDROMORPHONE HCL 1 MG/ML IJ SOLN
1.0000 mg | Freq: Once | INTRAMUSCULAR | Status: AC
Start: 2021-01-03 — End: 2021-01-03
  Administered 2021-01-03: 1 mg via INTRAVENOUS
  Filled 2021-01-03: qty 1

## 2021-01-03 MED ORDER — ONDANSETRON HCL 4 MG PO TABS: 4.0000 mg | ORAL_TABLET | Freq: Four times a day (QID) | ORAL | Status: DC | PRN

## 2021-01-03 MED ORDER — ENOXAPARIN SODIUM 40 MG/0.4ML IJ SOSY
40.0000 mg | PREFILLED_SYRINGE | INTRAMUSCULAR | Status: DC
  Administered 2021-01-03 – 2021-01-04 (×2): 40 mg via SUBCUTANEOUS
  Filled 2021-01-03 (×2): qty 0.4

## 2021-01-03 MED ORDER — THIAMINE HCL 100 MG/ML IJ SOLN: 100.0000 mg | Freq: Every day | INTRAMUSCULAR | Status: DC

## 2021-01-03 MED ORDER — SODIUM CHLORIDE 0.9 % IV BOLUS
1000.0000 mL | Freq: Once | INTRAVENOUS | Status: AC
  Administered 2021-01-03: 1000 mL via INTRAVENOUS

## 2021-01-03 MED ORDER — ONDANSETRON HCL 4 MG/2ML IJ SOLN: 4.0000 mg | Freq: Four times a day (QID) | INTRAMUSCULAR | Status: DC | PRN

## 2021-01-03 MED ORDER — HYDROMORPHONE HCL 1 MG/ML IJ SOLN
1.0000 mg | Freq: Once | INTRAMUSCULAR | Status: AC
  Administered 2021-01-03: 1 mg via INTRAVENOUS
  Filled 2021-01-03: qty 1

## 2021-01-03 MED ORDER — HYDROMORPHONE HCL 1 MG/ML IJ SOLN
1.0000 mg | INTRAMUSCULAR | Status: DC | PRN
Start: 2021-01-03 — End: 2021-01-05
  Administered 2021-01-03 – 2021-01-05 (×10): 1 mg via INTRAVENOUS
  Filled 2021-01-03 (×11): qty 1

## 2021-01-03 MED ORDER — ACETAMINOPHEN 650 MG RE SUPP: 650.0000 mg | Freq: Four times a day (QID) | RECTAL | Status: DC | PRN

## 2021-01-03 MED ORDER — THIAMINE HCL 100 MG PO TABS
100.0000 mg | ORAL_TABLET | Freq: Every day | ORAL | Status: DC
  Administered 2021-01-04 – 2021-01-05 (×3): 100 mg via ORAL
  Filled 2021-01-03 (×2): qty 1

## 2021-01-03 MED ORDER — LABETALOL HCL 5 MG/ML IV SOLN
10.0000 mg | INTRAVENOUS | Status: DC | PRN
  Administered 2021-01-03: 10 mg via INTRAVENOUS
  Filled 2021-01-03: qty 4

## 2021-01-03 MED ORDER — ONDANSETRON HCL 4 MG/2ML IJ SOLN
4.0000 mg | Freq: Once | INTRAMUSCULAR | Status: AC
  Administered 2021-01-03: 4 mg via INTRAVENOUS
  Filled 2021-01-03: qty 2

## 2021-01-03 MED ORDER — FOLIC ACID 1 MG PO TABS
1.0000 mg | ORAL_TABLET | Freq: Every day | ORAL | Status: DC
  Administered 2021-01-03 – 2021-01-05 (×3): 1 mg via ORAL
  Filled 2021-01-03 (×3): qty 1

## 2021-01-03 MED ORDER — IPRATROPIUM-ALBUTEROL 0.5-2.5 (3) MG/3ML IN SOLN: 3.0000 mL | RESPIRATORY_TRACT | Status: DC | PRN

## 2021-01-03 MED ORDER — LORAZEPAM 2 MG/ML IJ SOLN: 1.0000 mg | INTRAMUSCULAR | Status: DC | PRN

## 2021-01-03 MED ORDER — ACETAMINOPHEN 325 MG PO TABS
650.0000 mg | ORAL_TABLET | Freq: Four times a day (QID) | ORAL | Status: DC | PRN
Start: 2021-01-03 — End: 2021-01-05
  Administered 2021-01-04: 650 mg via ORAL
  Filled 2021-01-03: qty 2

## 2021-01-03 MED ORDER — LACTATED RINGERS IV SOLN: INTRAVENOUS | Status: DC

## 2021-01-03 MED ORDER — SODIUM CHLORIDE 0.9 % IV BOLUS
500.0000 mL | Freq: Once | INTRAVENOUS | Status: AC
  Administered 2021-01-03: 500 mL via INTRAVENOUS

## 2021-01-03 MED ORDER — LORAZEPAM 1 MG PO TABS: 1.0000 mg | ORAL_TABLET | ORAL | Status: DC | PRN

## 2021-01-03 MED ORDER — DEXTROSE IN LACTATED RINGERS 5 % IV SOLN: INTRAVENOUS | Status: DC

## 2021-01-03 NOTE — ED Triage Notes (Signed)
Abdominal pain with vomiting 

## 2021-01-03 NOTE — H&P (Addendum)
History and Physical    Colleen Lee WCB:[id number] DOB: 1956-01-06 DOA: 01/03/2021  PCP: Rosita Fire, MD   Patient coming from: Home  I have personally briefly reviewed patient's old medical records in [place]  Chief Complaint: Vomiting, abdominal pain  HPI: Colleen Lee is a 65 y.o. female with medical history significant for [name] and alcoholic liver cirrhosis, hypertension.  Patient presented to the ED with complaints of abdominal pain and vomiting 2 days duration, also with abdominal bloating.  She has had acute pancreatitis before.  She reports her last alcoholic drink was just over a week ago before [facility].  She denies daily alcohol intake.  ED Course: Stable vitals.  CT abdomen pelvis without contrast consistent with acute pancreatitis, shows chronic mild extrahepatic biliary dilatation.  Lipase- 263.  Unremarkable liver enzymes.  1 L bolus given, 1 mg IV Dilaudid x2 for pain control.  Hospitalist to admit for acute pancreatitis.  Review of Systems: As per HPI all other systems reviewed and negative.  Past Medical History:  Diagnosis Date  . Asthma   . Chronic abdominal pain   . Chronic back pain   . Chronic constipation   . Cirrhosis ([place])    Metavir score F4 on elastography 2015  . Cyclical vomiting   . Fibroids   . Fibromyalgia   . GERD (gastroesophageal reflux disease)   . H. pylori infection 2014   treated with pylera, had to be treated again as it was not eradicated. Urea breath test then negative after subsequent treatment.   . [name]    HCV RNA positive 09/2012  . Hypertension   . Marijuana use   . Nausea and vomiting    chronic, recurrent  . PONV (postoperative nausea and vomiting)    pt thinks maybe once she had N&V  . Sciatica of left side     Past Surgical History:  Procedure Laterality Date  . BALLOON DILATION  12/20/2017   Procedure: BALLOON DILATION;  Surgeon: Danie Binder, MD;  [facility];  Service:  Endoscopy;;  pyloric dilation  . BIOPSY  12/20/2017   Procedure: BIOPSY;  Surgeon: Danie Binder, MD;  [facility];  Service: Endoscopy;;  duodenal and gastric biopsy  . COLONOSCOPY  01/18/2008   OHY:WVPXTG rectum.  Long redundant colon, a diminutive sigmoid polyp status post cold biopsy removed. Hyperplastic polyp. Repeat colonoscopy June 2014 due to family history of colon cancer  . COLONOSCOPY WITH ESOPHAGOGASTRODUODENOSCOPY (EGD) N/A 11/02/2012   GYI:RSWNIOEV gastric mucosa of doubtful, +H.pylori. Incomplete colonoscopy due to patient unable to tolerate exam, proximal colon seen. Patient refused ACBE.  Marland Kitchen COLONOSCOPY WITH PROPOFOL N/A 03/08/2013   Dr. Gala Romney: colonic polyp-removed as scribed above. Internal Hemorrhoids. Pathology did not reveal any colonic tissue, only mucus. SURVEILLANCE DUE Aug 2019  . COLONOSCOPY WITH PROPOFOL N/A 01/19/2018   Dr. Gala Romney: 6 mm cecal inflammatoy polyp, otherwise normal., Surveilance in 5 year s  . ESOPHAGEAL DILATION  12/20/2017   Procedure: ESOPHAGEAL DILATION;  Surgeon: Danie Binder, MD;  [facility];  Service: Endoscopy;;  . ESOPHAGOGASTRODUODENOSCOPY  01/18/2008   RMR: Normal esophagus, normal  stomach  . ESOPHAGOGASTRODUODENOSCOPY (EGD) WITH PROPOFOL N/A 02/09/2016   Normal esophagus, small hiatal hernia, portal hypertensive gastropathy, normal second portion of duodenum. Due in July 2019  . ESOPHAGOGASTRODUODENOSCOPY (EGD) WITH PROPOFOL N/A 12/20/2017   Dr. Oneida Alar: benign appearing esophageal stenosis s/p dilation, mild pyloric stenosis s/p biopsy and dilation, mild  duodenitis.   Marland Kitchen ESOPHAGOGASTRODUODENOSCOPY (EGD) WITH PROPOFOL N/A 06/12/2020   Rourk: Normal esophagus, portal hypertensive gastropathy, no specimens collected.  Tentatively plan for repeat EGD in 2 years.  Marland Kitchen HYSTEROSCOPY  05/11/2016   Procedure: HYSTEROSCOPY;  Surgeon: Jonnie Kind, MD;  Location: [facility];  Service: Gynecology;;  . KNEE SURGERY     left knee  .  MULTIPLE EXTRACTIONS WITH ALVEOLOPLASTY N/A 10/15/2013   Procedure: MULTIPLE EXTRACTION WITH ALVEOLOPLASTY;  Surgeon: Gae Bon, DDS;  Location: [place];  Service: Oral Surgery;  Laterality: N/A;  . POLYPECTOMY N/A 03/08/2013   Procedure: POLYPECTOMY;  Surgeon: Daneil Dolin, MD;  Location: [facility];  Service: Endoscopy;  Laterality: N/A;  . POLYPECTOMY N/A 05/11/2016   Procedure: REMOVAL OF ENDOMETRIAL POLYP;  Surgeon: Jonnie Kind, MD;  Location: [facility];  Service: Gynecology;  Laterality: N/A;  . RESECTION DISTAL CLAVICAL Right 01/30/2019   Procedure: RESECTION DISTAL CLAVICAL;  Surgeon: Carole Civil, MD;  Location: [facility];  Service: Orthopedics;  Laterality: Right;  . SHOULDER OPEN ROTATOR CUFF REPAIR Right 01/30/2019   Procedure: ROTATOR CUFF REPAIR SHOULDER OPEN;  Surgeon: Carole Civil, MD;  Location: [facility];  Service: Orthopedics;  Laterality: Right;  pt to arrive at 7:30 for PICC at 8:00  . TOTAL KNEE ARTHROPLASTY Left 02/05/2015   Procedure: LEFT TOTAL KNEE ARTHROPLASTY;  Surgeon: Carole Civil, MD;  Location: [facility];  Service: Orthopedics;  Laterality: Left;     reports that she has been smoking cigarettes. She has a 40.00 pack-year smoking history. She has never used smokeless tobacco. She reports current alcohol use. She reports previous drug use. Drug: Marijuana.  Allergies  Allergen Reactions  . Penicillins Rash    Did it involve swelling of the face/tongue/throat, SOB, or low BP? No Did it involve sudden or severe rash/hives, skin peeling, or any reaction on the inside of your mouth or nose? No Did you need to seek medical attention at a hospital or doctor's office? No When did it last happen?Many years ago If all above answers are "NO", may proceed with cephalosporin use.      Family History  Problem Relation Age of Onset  . Colon cancer Brother 90          . Multiple myeloma Brother   . Liver cancer Sister   . Prostate cancer Brother   .  Pancreatic cancer Brother   . Cancer Mother        breast  . Asthma Mother     Prior to Admission medications   Medication Sig Start Date End Date Taking? Authorizing Provider  albuterol (PROVENTIL HFA;VENTOLIN HFA) 108 (90 Base) MCG/ACT inhaler Inhale 2 puffs into the lungs every 6 (six) hours as needed for wheezing or shortness of breath.    Yes [provider]  Cyanocobalamin (B-12) 2500 MCG TABS Take 2,500 mcg by mouth daily.   Yes [provider]  gabapentin (NEURONTIN) 100 MG capsule Take 1 capsule (100 mg total) by mouth 3 (three) times daily. Patient taking differently: Take 100 mg by mouth 2 (two) times daily. Takes 1 tablet in the morning and 1 tablet in the evening 12/22/18  Yes Carole Civil, MD  hydrALAZINE (APRESOLINE) 50 MG tablet Take 1 tablet (50 mg total) by mouth every 8 (eight) hours. 01/30/18  Yes Barton Dubois, MD  HYDROcodone-acetaminophen (NORCO/VICODIN) 5-325 MG tablet Take 1 tablet by mouth 3 (three) times daily as needed for pain. 12/04/20  Yes [provider]  hydrOXYzine (ATARAX/VISTARIL) 10 MG tablet Take 10 mg by mouth daily.  11/30/17  Yes [provider]  ibuprofen (ADVIL) 800 MG tablet TAKE (1) TABLET BY MOUTH EVERY EIGHT HOURS AS NEEDED. Patient taking differently: Take 800 mg by mouth every 8 (eight) hours as needed for fever, headache or mild pain. 12/08/20  Yes Carole Civil, MD  labetalol (NORMODYNE) 100 MG tablet Take 2 tablets (200 mg total) by mouth 2 (two) times daily. Patient taking differently: Take 100 mg by mouth in the morning. 01/30/18  Yes Barton Dubois, MD  megestrol (MEGACE) 40 MG tablet Take 40 mg by mouth daily.   Yes [provider]  naloxone (NARCAN) nasal spray 4 mg/0.1 mL See admin instructions. ADMINISTER A SINGLE SPRAYEOF NARCAN INTO ONECNOSTRIL. REPEAT AFTER 3SMINUTES IN OTHER NOSTRIL IF NO RESPONSE. 10/03/20  Yes [provider]  ondansetron (ZOFRAN) 4 MG tablet Take 4 mg by mouth  2 (two) times daily. 12/28/19  Yes [provider]  pantoprazole (PROTONIX) 40 MG tablet TAKE (1) TABLET BY MOUTH DAILY 30 MINUTES BEFORE BREAKFAST. Patient taking differently: Take 40 mg by mouth daily. 10/27/20  Yes Aliene Altes S, PA-C  polyethylene glycol powder (MIRALAX) 17 GM/SCOOP powder Use 1-2 capfuls in 8 oz of liquid up to 3 times a day until you are having regular bowel movements 10/21/20  Yes Caccavale, Sophia, PA-C  potassium chloride (MICRO-K) 10 MEQ CR capsule Take 20 mEq by mouth daily.   Yes [provider]  TRULANCE 3 MG TABS Take 3 mg by mouth daily. 01/01/21  Yes [provider]  cyclobenzaprine (FLEXERIL) 5 MG tablet Take 1 tablet (5 mg total) by mouth 2 (two) times daily as needed for muscle spasms. Patient not taking: Reported on 01/03/2021 12/07/19   Carole Civil, MD  oxyCODONE-acetaminophen (PERCOCET) 5-325 MG tablet Take 1 tablet by mouth every 8 (eight) hours as needed. Patient not taking: Reported on 01/03/2021 09/27/20   Milton Ferguson, MD    Physical Exam: Vitals:   01/03/21 1412 01/03/21 1413  BP: (!) 152/101   Pulse: 95   Resp:  18  Temp: 98.5 F (36.9 C)   SpO2: 97%     Constitutional: NAD, calm, comfortable Vitals:   01/03/21 1412 01/03/21 1413  BP: (!) 152/101   Pulse: 95   Resp:  18  Temp: 98.5 F (36.9 C)   SpO2: 97%    Eyes: PERRL, lids and conjunctivae normal ENMT: Mucous membranes are moist. Posterior pharynx clear of any exudate or lesions.Normal dentition.  Neck: normal, supple, no masses, no thyromegaly Respiratory: clear to auscultation bilaterally, no wheezing, no crackles. Normal respiratory effort. No accessory muscle use.  Cardiovascular: Regular rate and rhythm, no murmurs / rubs / gallops. No extremity edema. 2+ pedal pulses.  Abdomen: Abdominal bloating, diffuse abdominal tenderness, abdomen soft,  no masses palpated. No hepatosplenomegaly. Musculoskeletal: no clubbing / cyanosis. No joint deformity  upper and lower extremities. Good ROM, no contractures. Normal muscle tone.  Skin: no rashes, lesions, ulcers. No induration Neurologic: Speech occasionally slurred, and occasionally difficult to understand, but patient states this is her baseline.  No apparent cranial abnormality, 4+5 strength in all extremities.  Psychiatric: Normal judgment and insight. Alert and oriented x 3. Normal mood.   Labs on Admission: I have personally reviewed following labs and imaging studies  CBC: Recent Labs  Lab 01/03/21 1434  WBC 9.3  HGB 14.1  HCT 42.9  MCV 97.1  PLT 258   Basic  Metabolic Panel: Recent Labs  Lab 01/03/21 1434  NA 136  K 4.0  CL 105  CO2 26  GLUCOSE 102*  BUN 8  CREATININE 0.79  CALCIUM 11.1*   GFR: CrCl cannot be calculated (Unknown ideal weight.). Liver Function Tests: Recent Labs  Lab 01/03/21 1434  AST 17  ALT 15  ALKPHOS 84  BILITOT 0.8  PROT 8.0  ALBUMIN 3.9   Recent Labs  Lab 01/03/21 1434  LIPASE 263*     Radiological Exams on Admission: CT ABDOMEN PELVIS WO CONTRAST  Result Date: 01/03/2021 CLINICAL DATA:  Abdominal pain and vomiting. EXAM: CT ABDOMEN AND PELVIS WITHOUT CONTRAST TECHNIQUE: Multidetector CT imaging of the abdomen and pelvis was performed following the standard protocol without IV contrast. COMPARISON:  09/27/2020 FINDINGS: Lower chest: Descending thoracic aortic atherosclerotic vascular calcification. Hepatobiliary: Common bile duct is mildly prominent at 0.8 cm on image 29 series 2, similar to prior exams such 09/09/2020. The liver appears otherwise unremarkable in noncontrast CT appearance. Pancreas: Worsen bilateral peripancreatic inflammatory stranding favoring acute pancreatitis. Acute peripancreatic fluid but no obvious pseudocyst or abscess. Today's noncontrast exam does not evaluate for pancreatic necrosis. Spleen: Unremarkable Adrenals/Urinary Tract: Unremarkable Stomach/Bowel: Unremarkable Vascular/Lymphatic: Aortoiliac  atherosclerotic vascular disease. Reproductive: Calcified uterine fibroids. Other: No supplemental non-categorized findings. Musculoskeletal: Unremarkable IMPRESSION: 1. Acute pancreatitis.  No pseudocyst identified. 2. Chronic mild extrahepatic biliary dilatation. 3.  Aortic Atherosclerosis (ICD10-I70.0). 4. Calcified uterine fibroids. Electronically Signed   By: Van Clines M.D.   On: 01/03/2021 16:08    EKG: None.  Assessment/Plan Principal Problem:   Acute pancreatitis Active Problems:   Chronic [name] with cirrhosis (H[facility])   Asthma   Alcoholic cirrhosis of liver without ascites (HCC)   Essential hypertension  Acute pancreatitis-as seen on abdominal CT, lipase elevated 263, stable vitals.  WBC 9.3.  Unremarkable liver enzymes.  Calcium- 11.1, at baseline chronically slightly elevated. -Bowel rest, n.p.o. except for water -IV Dilaudid 1 mg every 4 hourly as needed -1.5  L bolus given, continue D5 RL 125cc/hr  -BMP, CBC in the morning -Check a.m. triglycerides -IV Protonix 40 daily  Alcoholic liver disease with cirrhosis-reports last alcoholic beverage just over a week ago.  Denies daily alcohol intake.  Blood count stable. -Obtain blood alcohol level -Check magnesium, phosphorus - CIWA PRN -Thiamine folate multivitamins  Hypertension-Systolic 466Z. -Hold home hydralazine and labetalol while n.p.o. -IV labetalol 10 mg PRN  [name]-completed treatment with [facility] 2015.  Asthma- Stable - Albuterol nebs prn   DVT prophylaxis: Lovenox Code Status: Full code Family Communication: None at bedside Disposition Plan: ~ 2 days Consults called: None Admission status: Obs, tele   Bethena Roys MD [facility]  01/03/2021, 7:18 PM

## 2021-01-03 NOTE — ED Provider Notes (Signed)
[facility] EMERGENCY DEPARTMENT Provider Note   CSN: [id number] Arrival date & time: 01/03/21  1321     History Chief Complaint  Patient presents with  . Abdominal Pain    Colleen Lee is a 65 y.o. female.  Patient with abdominal pain for 2 days and vomiting.  Patient states she has a history of pancreatitis and she feels like her pancreas is acting up now.  Patient not vomiting up any blood  The history is provided by the patient and medical records. No language interpreter was used.  Abdominal Pain Pain location:  Epigastric Pain quality: aching   Pain radiates to:  Does not radiate Pain severity:  Moderate Onset quality:  Sudden Timing:  Constant Progression:  Worsening Associated symptoms: vomiting   Associated symptoms: no chest pain, no cough, no diarrhea, no fatigue and no hematuria        Past Medical History:  Diagnosis Date  . Asthma   . Chronic abdominal pain   . Chronic back pain   . Chronic constipation   . Cirrhosis ([place])    Metavir score F4 on elastography 2015  . Cyclical vomiting   . Fibroids   . Fibromyalgia   . GERD (gastroesophageal reflux disease)   . H. pylori infection 2014   treated with pylera, had to be treated again as it was not eradicated. Urea breath test then negative after subsequent treatment.   . [name]    HCV RNA positive 09/2012  . Hypertension   . Marijuana use   . Nausea and vomiting    chronic, recurrent  . PONV (postoperative nausea and vomiting)    pt thinks maybe once she had N&V  . Sciatica of left side     Patient Active Problem List   Diagnosis Date Noted  . Dysuria 09/16/2020  . Acute pancreatitis 10/17/2019  . ETOH abuse 10/17/2019  . S/P right rotator cuff repair 01/30/19 02/06/2019  . Nontraumatic complete tear of right rotator cuff   . Arthritis of right acromioclavicular joint   . S/P total knee replacement, 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/24/2018  . Shoulder impingement, right 05/24/2018  . Dehydration   .  Intractable cyclical vomiting 78/46/9629  . Pyloric stenosis in adult   . Thrombocytopenia ([place]) 12/20/2017  . Leukocytosis   . Malnutrition of moderate degree 12/17/2017  . Acute renal failure (ARF) ([place]) 12/15/2017  . Chronic abdominal pain 12/15/2017  . Gastroenteritis 06/14/2016  . Nausea with vomiting 06/10/2016  . Uterine enlargement 06/09/2016  . Intractable nausea and vomiting 06/08/2016  . Acute infective gastroenteritis 06/08/2016  . Diarrhea 06/08/2016  . Essential hypertension 06/08/2016  . GERD (gastroesophageal reflux disease) 06/08/2016  . Abdominal pain   . Endometrial polyp 04/15/2016  . PMB (postmenopausal bleeding) 02/27/2016  . Alcoholic cirrhosis of liver without ascites ([place])   . Asthma 01/21/2016  . Hepatic cirrhosis ([place]) 09/22/2015  . Arthritis of knee, degenerative 02/05/2015  . History of Helicobacter pylori infection 10/30/2014  . Chronic [name] with cirrhosis ([place]) 04/11/2014  . De Quervain's disease (radial styloid tenosynovitis) 12/25/2013  . Anorexia 11/21/2012  . FH: colon cancer 11/21/2012  . Early satiety 10/25/2012  . Bowel habit changes 10/25/2012  . Abdominal pain, epigastric 10/25/2012  . Abdominal bloating 10/25/2012  . Constipation 10/25/2012  . Abnormal weight loss 10/25/2012  . Chronic viral [name] ([place]) 10/25/2012  . Radicular pain of left lower extremity 09/28/2012  . Back pain 09/28/2012  . Sciatica 08/10/2011  . S/P arthroscopy of  left knee 08/10/2011  . Tibial plateau fracture 08/10/2011  . Pain in joint, lower leg 02/12/2011  . Stiffness of joint, not elsewhere classified, lower leg 02/12/2011  . 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 Meniscus, medial, derangement 12/29/2010  . CLOSED FRACTURE OF UPPER END OF TIBIA 08/12/2010    Past Surgical History:  Procedure Laterality Date  . BALLOON DILATION  12/20/2017   Procedure: BALLOON DILATION;  Surgeon: Danie Binder, MD;  Location[facility];  Service:  Endoscopy;;  pyloric dilation  . BIOPSY  12/20/2017   Procedure: BIOPSY;  Surgeon: Danie Binder, MD;  Location: [facility];  Service: Endoscopy;;  duodenal and gastric biopsy  . COLONOSCOPY  01/18/2008   DTO:IZTIWP rectum.  Long redundant colon, a diminutive sigmoid polyp status post cold biopsy removed. Hyperplastic polyp. Repeat colonoscopy June 2014 due to family history of colon cancer  . COLONOSCOPY WITH ESOPHAGOGASTRODUODENOSCOPY (EGD) N/A 11/02/2012   YKD:XIPJASNK gastric mucosa of doubtful, +H.pylori. Incomplete colonoscopy due to patient unable to tolerate exam, proximal colon seen. Patient refused ACBE.  Marland Kitchen COLONOSCOPY WITH PROPOFOL N/A 03/08/2013   Dr. Gala Romney: colonic polyp-removed as scribed above. Internal Hemorrhoids. Pathology did not reveal any colonic tissue, only mucus. SURVEILLANCE DUE Aug 2019  . COLONOSCOPY WITH PROPOFOL N/A 01/19/2018   Dr. Gala Romney: 6 mm cecal inflammatoy polyp, otherwise normal., Surveilance in 5 year s  . ESOPHAGEAL DILATION  12/20/2017   Procedure: ESOPHAGEAL DILATION;  Surgeon: Danie Binder, MD;  [facility]: [facility];  Service: Endoscopy;;  . ESOPHAGOGASTRODUODENOSCOPY  01/18/2008   RMR: Normal esophagus, normal  stomach  . ESOPHAGOGASTRODUODENOSCOPY (EGD) WITH PROPOFOL N/A 02/09/2016   Normal esophagus, small hiatal hernia, portal hypertensive gastropathy, normal second portion of duodenum. Due in July 2019  . ESOPHAGOGASTRODUODENOSCOPY (EGD) WITH PROPOFOL N/A 12/20/2017   Dr. Oneida Alar: benign appearing esophageal stenosis s/p dilation, mild pyloric stenosis s/p biopsy and dilation, mild duodenitis.   Marland Kitchen ESOPHAGOGASTRODUODENOSCOPY (EGD) WITH PROPOFOL N/A 06/12/2020   Rourk: Normal esophagus, portal hypertensive gastropathy, no specimens collected.  Tentatively plan for repeat EGD in 2 years.  Marland Kitchen HYSTEROSCOPY  05/11/2016   Procedure: HYSTEROSCOPY;  Surgeon: Jonnie Kind, MD;  Location: [facility] OR[facility];  Service: Gynecology;;  . KNEE SURGERY     left knee  .  MULTIPLE EXTRACTIONS WITH ALVEOLOPLASTY N/A 10/15/2013   Procedure: MULTIPLE EXTRACTION WITH ALVEOLOPLASTY;  Surgeon: Gae Bon, DDS;  Location: [facility];  Service: Oral Surgery;  Laterality: N/A;  . POLYPECTOMY N/A 03/08/2013   Procedure: POLYPECTOMY;  Surgeon: Daneil Dolin, MD;  Location: [facility];  Service: Endoscopy;  Laterality: N/A;  . POLYPECTOMY N/A 05/11/2016   Procedure: REMOVAL OF ENDOMETRIAL POLYP;  Surgeon: Jonnie Kind, MD;  Location: [facility];  Service: Gynecology;  Laterality: N/A;  . RESECTION DISTAL CLAVICAL Right 01/30/2019   Procedure: RESECTION DISTAL CLAVICAL;  Surgeon: Carole Civil, MD;  Location: [facility];  Service: Orthopedics;  Laterality: Right;  . SHOULDER OPEN ROTATOR CUFF REPAIR Right 01/30/2019   Procedure: ROTATOR CUFF REPAIR SHOULDER OPEN;  Surgeon: Carole Civil, MD;  Location: [facility];  Service: Orthopedics;  Laterality: Right;  pt to arrive at 7:30 for PICC at 8:00  . TOTAL KNEE ARTHROPLASTY Left 02/05/2015   Procedure: LEFT TOTAL KNEE ARTHROPLASTY;  Surgeon: Carole Civil, MD;  Location: [facility];  Service: Orthopedics;  Laterality: Left;     OB History    Gravida      Para      Term  Preterm      AB      Living  1     SAB      IAB      Ectopic      Multiple      Live Births              Family History  Problem Relation Age of Onset  . Colon cancer Brother 33          . Multiple myeloma Brother   . Liver cancer Sister   . Prostate cancer Brother   . Pancreatic cancer Brother   . Cancer Mother        breast  . Asthma Mother     Social History   Tobacco Use  . Smoking status: Current Every Day Smoker    Packs/day: 1.00    Years: 40.00    Pack years: 40.00    Types: Cigarettes  . Smokeless tobacco: Never Used  . Tobacco comment: Smokes one pack of cigarettes every 1.5 days  Vaping Use  . Vaping Use: Never used  Substance Use Topics  . Alcohol use: Yes    Alcohol/week: 0.0 standard drinks    Comment:  1 bottle of dinner wine daily  . Drug use: Not Currently    Types: Marijuana    Comment: history of marijuana in past- almost 2 years ago    Home Medications Prior to Admission medications   Medication Sig Start Date End Date Taking? Authorizing Provider  albuterol (PROVENTIL HFA;VENTOLIN HFA) 108 (90 Base) MCG/ACT inhaler Inhale 2 puffs into the lungs every 6 (six) hours as needed for wheezing or shortness of breath.    Yes [provider]  Cyanocobalamin (B-12) 2500 MCG TABS Take 2,500 mcg by mouth daily.   Yes [provider]  gabapentin (NEURONTIN) 100 MG capsule Take 1 capsule (100 mg total) by mouth 3 (three) times daily. Patient taking differently: Take 100 mg by mouth 2 (two) times daily. Takes 1 tablet in the morning and 1 tablet in the evening 12/22/18  Yes Carole Civil, MD  hydrALAZINE (APRESOLINE) 50 MG tablet Take 1 tablet (50 mg total) by mouth every 8 (eight) hours. 01/30/18  Yes Barton Dubois, MD  HYDROcodone-acetaminophen (NORCO/VICODIN) 5-325 MG tablet Take 1 tablet by mouth 3 (three) times daily as needed for pain. 12/04/20  Yes [provider]  hydrOXYzine (ATARAX/VISTARIL) 10 MG tablet Take 10 mg by mouth daily.  11/30/17  Yes [provider]  ibuprofen (ADVIL) 800 MG tablet TAKE (1) TABLET BY MOUTH EVERY EIGHT HOURS AS NEEDED. Patient taking differently: Take 800 mg by mouth every 8 (eight) hours as needed for fever, headache or mild pain. 12/08/20  Yes Carole Civil, MD  labetalol (NORMODYNE) 100 MG tablet Take 2 tablets (200 mg total) by mouth 2 (two) times daily. Patient taking differently: Take 100 mg by mouth in the morning. 01/30/18  Yes Barton Dubois, MD  megestrol (MEGACE) 40 MG tablet Take 40 mg by mouth daily.   Yes [provider]  naloxone (NARCAN) nasal spray 4 mg/0.1 mL See admin instructions. ADMINISTER A SINGLE SPRAYEOF NARCAN INTO ONECNOSTRIL. REPEAT AFTER 3SMINUTES IN OTHER NOSTRIL IF NO RESPONSE. 10/03/20   Yes [provider]  ondansetron (ZOFRAN) 4 MG tablet Take 4 mg by mouth 2 (two) times daily. 12/28/19  Yes [provider]  pantoprazole (PROTONIX) 40 MG tablet TAKE (1) TABLET BY MOUTH DAILY 30 MINUTES BEFORE BREAKFAST. Patient taking differently: Take 40 mg  by mouth daily. 10/27/20  Yes Aliene Altes S, PA-C  polyethylene glycol powder (MIRALAX) 17 GM/SCOOP powder Use 1-2 capfuls in 8 oz of liquid up to 3 times a day until you are having regular bowel movements 10/21/20  Yes Caccavale, Sophia, PA-C  potassium chloride (MICRO-K) 10 MEQ CR capsule Take 20 mEq by mouth daily.   Yes [provider]  TRULANCE 3 MG TABS Take 3 mg by mouth daily. 01/01/21  Yes [provider]  cyclobenzaprine (FLEXERIL) 5 MG tablet Take 1 tablet (5 mg total) by mouth 2 (two) times daily as needed for muscle spasms. Patient not taking: Reported on 01/03/2021 12/07/19   Carole Civil, MD  oxyCODONE-acetaminophen (PERCOCET) 5-325 MG tablet Take 1 tablet by mouth every 8 (eight) hours as needed. Patient not taking: Reported on 01/03/2021 09/27/20   Milton Ferguson, MD    Allergies    Penicillins  Review of Systems   Review of Systems  Constitutional: Negative for appetite change and fatigue.  HENT: Negative for congestion, ear discharge and sinus pressure.   Eyes: Negative for discharge.  Respiratory: Negative for cough.   Cardiovascular: Negative for chest pain.  Gastrointestinal: Positive for abdominal pain and vomiting. Negative for diarrhea.  Genitourinary: Negative for frequency and hematuria.  Musculoskeletal: Negative for back pain.  Skin: Negative for rash.  Neurological: Negative for seizures and headaches.  Psychiatric/Behavioral: Negative for hallucinations.    Physical Exam Updated Vital Signs BP (!) 152/101   Pulse 95   Temp 98.5 F (36.9 C)   Resp 18   SpO2 97%   Physical Exam Vitals and nursing note reviewed.  Constitutional:      Appearance: She is  well-developed.  HENT:     Head: Normocephalic.     Nose: Nose normal.  Eyes:     General: No scleral icterus.    Conjunctiva/sclera: Conjunctivae normal.  Neck:     Thyroid: No thyromegaly.  Cardiovascular:     Rate and Rhythm: Normal rate and regular rhythm.     Heart sounds: No murmur heard. No friction rub. No gallop.   Pulmonary:     Breath sounds: No stridor. No wheezing or rales.  Chest:     Chest wall: No tenderness.  Abdominal:     General: There is no distension.     Tenderness: There is abdominal tenderness. There is no rebound.  Musculoskeletal:        General: Normal range of motion.     Cervical back: Neck supple.  Lymphadenopathy:     Cervical: No cervical adenopathy.  Skin:    Findings: No erythema or rash.  Neurological:     Mental Status: She is alert and oriented to person, place, and time.     Motor: No abnormal muscle tone.     Coordination: Coordination normal.  Psychiatric:        Behavior: Behavior normal.     ED Results / Procedures / Treatments   Labs (all labs ordered are listed, but only abnormal results are displayed) Labs Reviewed  LIPASE, BLOOD - Abnormal; Notable for the following components:      Result Value   Lipase 263 (*)    All other components within normal limits  COMPREHENSIVE METABOLIC PANEL - Abnormal; Notable for the following components:   Glucose, Bld 102 (*)    Calcium 11.1 (*)    All other components within normal limits  CBC  URINALYSIS, ROUTINE W REFLEX MICROSCOPIC    EKG None  Radiology CT ABDOMEN PELVIS WO CONTRAST  Result Date: 01/03/2021 CLINICAL DATA:  Abdominal pain and vomiting. EXAM: CT ABDOMEN AND PELVIS WITHOUT CONTRAST TECHNIQUE: Multidetector CT imaging of the abdomen and pelvis was performed following the standard protocol without IV contrast. COMPARISON:  09/27/2020 FINDINGS: Lower chest: Descending thoracic aortic atherosclerotic vascular calcification. Hepatobiliary: Common bile duct is mildly  prominent at 0.8 cm on image 29 series 2, similar to prior exams such 09/09/2020. The liver appears otherwise unremarkable in noncontrast CT appearance. Pancreas: Worsen bilateral peripancreatic inflammatory stranding favoring acute pancreatitis. Acute peripancreatic fluid but no obvious pseudocyst or abscess. Today's noncontrast exam does not evaluate for pancreatic necrosis. Spleen: Unremarkable Adrenals/Urinary Tract: Unremarkable Stomach/Bowel: Unremarkable Vascular/Lymphatic: Aortoiliac atherosclerotic vascular disease. Reproductive: Calcified uterine fibroids. Other: No supplemental non-categorized findings. Musculoskeletal: Unremarkable IMPRESSION: 1. Acute pancreatitis.  No pseudocyst identified. 2. Chronic mild extrahepatic biliary dilatation. 3.  Aortic Atherosclerosis (ICD10-I70.0). 4. Calcified uterine fibroids. Electronically Signed   By: Van Clines M.D.   On: 01/03/2021 16:08    Procedures Procedures   Medications Ordered in ED Medications  HYDROmorphone (DILAUDID) injection 1 mg (1 mg Intravenous Given 01/03/21 1533)  ondansetron (ZOFRAN) injection 4 mg (4 mg Intravenous Given 01/03/21 1533)  sodium chloride 0.9 % bolus 1,000 mL (1,000 mLs Intravenous New Bag/Given 01/03/21 1743)  HYDROmorphone (DILAUDID) injection 1 mg (1 mg Intravenous Given 01/03/21 1741)    ED Course  I have reviewed the triage vital signs and the nursing notes.  Pertinent labs & imaging results that were available during my care of the patient were reviewed by me and considered in my medical decision making (see chart for details).    MDM Rules/Calculators/A&P                          Patient with worsening pancreatitis.  Patient has elevated lipase and CT scan shows the pancreatitis she was admitted to medicine Final Clinical Impression(s) / ED Diagnoses Final diagnoses:  Idiopathic acute pancreatitis without infection or necrosis    Rx / DC Orders ED Discharge Orders    None       Milton Ferguson, MD 01/05/21 1055

## 2021-01-04 DIAGNOSIS — K852 Alcohol induced acute pancreatitis without necrosis or infection: Secondary | ICD-10-CM | POA: Diagnosis present

## 2021-01-04 DIAGNOSIS — Z20822 Contact with and (suspected) exposure to covid-19: Secondary | ICD-10-CM | POA: Diagnosis present

## 2021-01-04 DIAGNOSIS — K7030 Alcoholic cirrhosis of liver without ascites: Secondary | ICD-10-CM

## 2021-01-04 DIAGNOSIS — K8500 Idiopathic acute pancreatitis without necrosis or infection: Secondary | ICD-10-CM | POA: Diagnosis not present

## 2021-01-04 DIAGNOSIS — K859 Acute pancreatitis without necrosis or infection, unspecified: Secondary | ICD-10-CM | POA: Diagnosis not present

## 2021-01-04 DIAGNOSIS — I10 Essential (primary) hypertension: Secondary | ICD-10-CM | POA: Diagnosis not present

## 2021-01-04 DIAGNOSIS — K7460 Unspecified cirrhosis of liver: Secondary | ICD-10-CM

## 2021-01-04 DIAGNOSIS — M797 Fibromyalgia: Secondary | ICD-10-CM | POA: Diagnosis present

## 2021-01-04 DIAGNOSIS — Z825 Family history of asthma and other chronic lower respiratory diseases: Secondary | ICD-10-CM | POA: Diagnosis not present

## 2021-01-04 DIAGNOSIS — F17210 Nicotine dependence, cigarettes, uncomplicated: Secondary | ICD-10-CM | POA: Diagnosis present

## 2021-01-04 DIAGNOSIS — K219 Gastro-esophageal reflux disease without esophagitis: Secondary | ICD-10-CM | POA: Diagnosis present

## 2021-01-04 DIAGNOSIS — J4530 Mild persistent asthma, uncomplicated: Secondary | ICD-10-CM | POA: Diagnosis not present

## 2021-01-04 DIAGNOSIS — Z79899 Other long term (current) drug therapy: Secondary | ICD-10-CM | POA: Diagnosis not present

## 2021-01-04 DIAGNOSIS — Z880 Allergy status to penicillin: Secondary | ICD-10-CM | POA: Diagnosis not present

## 2021-01-04 DIAGNOSIS — B182 Chronic viral hepatitis C: Secondary | ICD-10-CM | POA: Diagnosis present

## 2021-01-04 DIAGNOSIS — J45909 Unspecified asthma, uncomplicated: Secondary | ICD-10-CM | POA: Diagnosis present

## 2021-01-04 DIAGNOSIS — N390 Urinary tract infection, site not specified: Secondary | ICD-10-CM | POA: Diagnosis present

## 2021-01-04 DIAGNOSIS — Z96652 Presence of left artificial knee joint: Secondary | ICD-10-CM | POA: Diagnosis present

## 2021-01-04 LAB — BASIC METABOLIC PANEL
Anion gap: 5 (ref 5–15)
BUN: 8 mg/dL (ref 8–23)
CO2: 23 mmol/L (ref 22–32)
Calcium: 10.1 mg/dL (ref 8.9–10.3)
Chloride: 110 mmol/L (ref 98–111)
Creatinine, Ser: 0.58 mg/dL (ref 0.44–1.00)
GFR, Estimated: >60 mL/min (ref >60)
Glucose, Bld: 95 mg/dL (ref 70–99)
Potassium: 3.6 mmol/L (ref 3.5–5.1)
Sodium: 138 mmol/L (ref 135–145)

## 2021-01-04 LAB — LIPID PANEL
Cholesterol: 108 mg/dL (ref 0–200)
HDL: 51 mg/dL (ref >40)
LDL Cholesterol: 47 mg/dL (ref 0–99)
Total CHOL/HDL Ratio: 2.1 RATIO
Triglycerides: 52 mg/dL (ref <150)
VLDL: 10 mg/dL (ref 0–40)

## 2021-01-04 LAB — CBC
HCT: 37.2 % (ref 36.0–46.0)
Hemoglobin: 11.9 g/dL — ABNORMAL LOW (ref 12.0–15.0)
MCH: 31.7 pg (ref 26.0–34.0)
MCHC: 32 g/dL (ref 30.0–36.0)
MCV: 99.2 fL (ref 80.0–100.0)
Platelets: 213 10*3/uL (ref 150–400)
RBC: 3.75 MIL/uL — ABNORMAL LOW (ref 3.87–5.11)
RDW: 14.3 % (ref 11.5–15.5)
WBC: 6.6 10*3/uL (ref 4.0–10.5)
nRBC: 0 % (ref 0.0–0.2)

## 2021-01-04 LAB — SARS CORONAVIRUS 2 (TAT 6-24 HRS): SARS Coronavirus 2: NEGATIVE

## 2021-01-04 LAB — HIV ANTIBODY (ROUTINE TESTING W REFLEX): HIV Screen 4th Generation wRfx: NONREACTIVE

## 2021-01-04 MED ORDER — LABETALOL HCL 200 MG PO TABS
100.0000 mg | ORAL_TABLET | Freq: Every morning | ORAL | Status: DC
  Administered 2021-01-04 – 2021-01-05 (×2): 100 mg via ORAL
  Filled 2021-01-04 (×2): qty 1

## 2021-01-04 MED ORDER — LACTATED RINGERS IV SOLN: INTRAVENOUS | Status: DC

## 2021-01-04 MED ORDER — HYDRALAZINE HCL 25 MG PO TABS
50.0000 mg | ORAL_TABLET | Freq: Three times a day (TID) | ORAL | Status: DC
  Administered 2021-01-04 – 2021-01-05 (×4): 50 mg via ORAL
  Filled 2021-01-04 (×4): qty 2

## 2021-01-04 MED ORDER — PLECANATIDE 3 MG PO TABS: 3.0000 mg | ORAL_TABLET | Freq: Every day | ORAL | Status: DC

## 2021-01-04 MED ORDER — GABAPENTIN 100 MG PO CAPS
100.0000 mg | ORAL_CAPSULE | Freq: Two times a day (BID) | ORAL | Status: DC
  Administered 2021-01-04 – 2021-01-05 (×3): 100 mg via ORAL
  Filled 2021-01-04 (×3): qty 1

## 2021-01-04 MED ORDER — SODIUM CHLORIDE 0.9 % IV SOLN
1.0000 g | INTRAVENOUS | Status: DC
  Administered 2021-01-04 – 2021-01-05 (×2): 1 g via INTRAVENOUS
  Filled 2021-01-04 (×2): qty 10

## 2021-01-04 NOTE — Progress Notes (Signed)
PROGRESS NOTE    Colleen Lee  FXT:[id number] DOB: 02-23-1956 DOA: 01/03/2021 PCP: Rosita Fire, MD    Brief Narrative:  65 year old female with a history of [name], alcoholic liver cirrhosis, hypertension, previous history of pancreatitis, admitted to the hospital with abdominal pain and vomiting.  She was noted to have elevated lipase and CT evidence of acute pancreatitis.  She was admitted for further management.   Assessment & Plan:   Principal Problem:   Acute pancreatitis Active Problems:   Chronic [name] with cirrhosis (HCC)   Asthma   Alcoholic cirrhosis of liver without ascites ([facility])   Essential hypertension   Acute pancreatitis -Suspect is related to alcohol -LFTs were otherwise unremarkable -She does not have any evidence of cholelithiasis -Treat supportively with IV fluids -She is continued to have abdominal pain, so we will hold off on advancing diet for now, continue n.p.o. -Continue IV pain management -Triglycerides unremarkable  Alcoholic liver disease with cirrhosis -Continue CIWA protocol -Continue thiamine and folate -Monitor for any developing ascites -Albumin currently 3.9  Possible UTI -Urinalysis indicates possible infection -Started on ceftriaxone -Checking urine culture  Hypertension -Continue hydralazine and labetalol  [name] -Completed treatment with [facility] in 2015  Asthma -No wheezing at this time -Continue bronchodilators as needed  GERD -Continue PPI   DVT prophylaxis: enoxaparin (LOVENOX) injection 40 mg Start: 01/03/21 2000  Code Status: full code Family Communication: discussed with patient Disposition Plan: Status is: Inpatient  The patient will require care spanning > 2 midnights and should be moved to inpatient because: Ongoing active pain requiring inpatient pain management  Dispo: The patient is from: Home              Anticipated d/c is to: Home              Patient currently is not medically  stable to d/c.   Difficult to place patient No         Consultants:     Procedures:     Antimicrobials:       Subjective: Continues to have significant abdominal pain on right side. No nausea or vomiting.  Objective: Vitals:   01/03/21 1413 01/03/21 1850 01/03/21 1913 01/04/21 0500  BP:  (!) 159/103 (!) 160/100 (!) 139/92  Pulse:  92 89 82  Resp: 18  16 17   Temp:  98.5 F (36.9 C) 98.2 F (36.8 C) 98.4 F (36.9 C)  TempSrc:  Oral Oral Oral  SpO2:  98% 93% 100%    Intake/Output Summary (Last 24 hours) at 01/04/2021 1235 Last data filed at 01/04/2021 0400 Gross per 24 hour  Intake 972.64 ml  Output --  Net 972.64 ml   There were no vitals filed for this visit.  Examination:  General exam: Appears calm and comfortable  Respiratory system: Clear to auscultation. Respiratory effort normal. Cardiovascular system: S1 & S2 heard, RRR. No JVD, murmurs, rubs, gallops or clicks. No pedal edema. Gastrointestinal system: Abdomen is soft, more tender on the right. No organomegaly or masses felt. Normal bowel sounds heard. Central nervous system: Alert and oriented. No focal neurological deficits. Extremities: Symmetric 5 x 5 power. Skin: No rashes, lesions or ulcers Psychiatry: Judgement and insight appear normal. Mood & affect appropriate.     Data Reviewed: I have personally reviewed following labs and imaging studies  CBC: Recent Labs  Lab 01/03/21 1434 01/04/21 0354  WBC 9.3 6.6  HGB 14.1 11.9*  HCT 42.9 37.2  MCV 97.1 99.2  PLT  258 096   Basic Metabolic Panel: Recent Labs  Lab 01/03/21 1434 01/03/21 1927 01/04/21 0354  NA 136  --  138  K 4.0  --  3.6  CL 105  --  110  CO2 26  --  23  GLUCOSE 102*  --  95  BUN 8  --  8  CREATININE 0.79  --  0.58  CALCIUM 11.1*  --  10.1  MG  --  1.9  --   PHOS  --  2.7  --    GFR: CrCl cannot be calculated (Unknown ideal weight.). Liver Function Tests: Recent Labs  Lab 01/03/21 1434  AST 17  ALT 15   ALKPHOS 84  BILITOT 0.8  PROT 8.0  ALBUMIN 3.9   Recent Labs  Lab 01/03/21 1434  LIPASE 263*   No results for input(s): AMMONIA in the last 168 hours. Coagulation Profile: No results for input(s): INR, PROTIME in the last 168 hours. Cardiac Enzymes: No results for input(s): CKTOTAL, CKMB, CKMBINDEX, TROPONINI in the last 168 hours. BNP (last 3 results) No results for input(s): PROBNP in the last 8760 hours. HbA1C: No results for input(s): HGBA1C in the last 72 hours. CBG: No results for input(s): GLUCAP in the last 168 hours. Lipid Profile: Recent Labs    01/04/21 0354  CHOL 108  HDL 51  LDLCALC 47  TRIG 52  CHOLHDL 2.1   Thyroid Function Tests: No results for input(s): TSH, T4TOTAL, FREET4, T3FREE, THYROIDAB in the last 72 hours. Anemia Panel: No results for input(s): VITAMINB12, FOLATE, FERRITIN, TIBC, IRON, RETICCTPCT in the last 72 hours. Sepsis Labs: No results for input(s): PROCALCITON, LATICACIDVEN in the last 168 hours.  Recent Results (from the past 240 hour(s))  SARS CORONAVIRUS 2 (TAT 6-24 HRS) Nasopharyngeal Nasopharyngeal Swab     Status: None   Collection Time: 01/03/21  6:30 PM   Specimen: Nasopharyngeal Swab  Result Value Ref Range Status   SARS Coronavirus 2 NEGATIVE NEGATIVE Final    Comment: (NOTE) SARS-CoV-2 target nucleic acids are NOT DETECTED.  The SARS-CoV-2 RNA is generally detectable in upper and lower respiratory specimens during the acute phase of infection. Negative results do not preclude SARS-CoV-2 infection, do not rule out co-infections with other pathogens, and should not be used as the sole basis for treatment or other patient management decisions. Negative results must be combined with clinical observations, patient history, and epidemiological information. The expected result is Negative.  Fact Sheet for Patients: SugarRoll.be  Fact Sheet for Healthcare  Providers: https://www.woods-mathews.com/  This test is not yet approved or cleared by the [place] FDA and  has been authorized for detection and/or diagnosis of SARS-CoV-2 by FDA under an Emergency Use Authorization (EUA). This EUA will remain  in effect (meaning this test can be used) for the duration of the COVID-19 declaration under Se ction 564(b)(1) of the Act, 21 U.S.C. section 360bbb-3(b)(1), unless the authorization is terminated or revoked sooner.  Performed at [facility], [place] [address], [place], [place] [address]          Radiology Studies: CT ABDOMEN PELVIS WO CONTRAST  Result Date: 01/03/2021 CLINICAL DATA:  Abdominal pain and vomiting. EXAM: CT ABDOMEN AND PELVIS WITHOUT CONTRAST TECHNIQUE: Multidetector CT imaging of the abdomen and pelvis was performed following the standard protocol without IV contrast. COMPARISON:  09/27/2020 FINDINGS: Lower chest: Descending thoracic aortic atherosclerotic vascular calcification. Hepatobiliary: Common bile duct is mildly prominent at 0.8 cm on image 29 series 2, similar  to prior exams such 09/09/2020. The liver appears otherwise unremarkable in noncontrast CT appearance. Pancreas: Worsen bilateral peripancreatic inflammatory stranding favoring acute pancreatitis. Acute peripancreatic fluid but no obvious pseudocyst or abscess. Today's noncontrast exam does not evaluate for pancreatic necrosis. Spleen: Unremarkable Adrenals/Urinary Tract: Unremarkable Stomach/Bowel: Unremarkable Vascular/Lymphatic: Aortoiliac atherosclerotic vascular disease. Reproductive: Calcified uterine fibroids. Other: No supplemental non-categorized findings. Musculoskeletal: Unremarkable IMPRESSION: 1. Acute pancreatitis.  No pseudocyst identified. 2. Chronic mild extrahepatic biliary dilatation. 3.  Aortic Atherosclerosis (ICD10-I70.0). 4. Calcified uterine fibroids. Electronically Signed   By: Van Clines M.D.   On: 01/03/2021 16:08         Scheduled Meds: . enoxaparin (LOVENOX) injection  40 mg Subcutaneous Q24H  . folic acid  1 mg Oral Daily  . multivitamin with minerals  1 tablet Oral Daily  . pantoprazole (PROTONIX) IV  40 mg Intravenous Q24H  . thiamine  100 mg Oral Daily   Or  . thiamine  100 mg Intravenous Daily   Continuous Infusions: . dextrose 5% lactated ringers 125 mL/hr at 01/03/21 2336     LOS: 0 days    Time spent: 39mins    Kathie Dike, MD [facility]   If 7PM-7AM, please contact night-coverage [email or web address]  01/04/2021, 12:35 PM

## 2021-01-04 NOTE — Progress Notes (Signed)
Patients o2 was 87% placed on 2l came up to 96% . Dr. Roderic Palau aware

## 2021-01-05 DIAGNOSIS — J4530 Mild persistent asthma, uncomplicated: Secondary | ICD-10-CM

## 2021-01-05 DIAGNOSIS — K8500 Idiopathic acute pancreatitis without necrosis or infection: Secondary | ICD-10-CM

## 2021-01-05 LAB — COMPREHENSIVE METABOLIC PANEL
ALT: 11 U/L (ref 0–44)
AST: 15 U/L (ref 15–41)
Albumin: 2.9 g/dL — ABNORMAL LOW (ref 3.5–5.0)
Alkaline Phosphatase: 68 U/L (ref 38–126)
Anion gap: 6 (ref 5–15)
BUN: 7 mg/dL — ABNORMAL LOW (ref 8–23)
CO2: 22 mmol/L (ref 22–32)
Calcium: 10 mg/dL (ref 8.9–10.3)
Chloride: 110 mmol/L (ref 98–111)
Creatinine, Ser: 0.6 mg/dL (ref 0.44–1.00)
GFR, Estimated: >60 mL/min (ref >60)
Glucose, Bld: 76 mg/dL (ref 70–99)
Potassium: 3.4 mmol/L — ABNORMAL LOW (ref 3.5–5.1)
Sodium: 138 mmol/L (ref 135–145)
Total Bilirubin: 0.7 mg/dL (ref 0.3–1.2)
Total Protein: 6.2 g/dL — ABNORMAL LOW (ref 6.5–8.1)

## 2021-01-05 LAB — CBC
HCT: 35.3 % — ABNORMAL LOW (ref 36.0–46.0)
Hemoglobin: 11.4 g/dL — ABNORMAL LOW (ref 12.0–15.0)
MCH: 31.9 pg (ref 26.0–34.0)
MCHC: 32.3 g/dL (ref 30.0–36.0)
MCV: 98.9 fL (ref 80.0–100.0)
Platelets: 190 10*3/uL (ref 150–400)
RBC: 3.57 MIL/uL — ABNORMAL LOW (ref 3.87–5.11)
RDW: 14.3 % (ref 11.5–15.5)
WBC: 6.4 10*3/uL (ref 4.0–10.5)
nRBC: 0 % (ref 0.0–0.2)

## 2021-01-05 LAB — LIPASE, BLOOD: Lipase: 60 U/L — ABNORMAL HIGH (ref 11–51)

## 2021-01-05 MED ORDER — CEFDINIR 300 MG PO CAPS: 300.0000 mg | ORAL_CAPSULE | Freq: Two times a day (BID) | ORAL | 0 refills | Status: DC

## 2021-01-05 MED ORDER — THIAMINE HCL 100 MG PO TABS: 100.0000 mg | ORAL_TABLET | Freq: Every day | ORAL | 1 refills | Status: DC

## 2021-01-05 MED ORDER — POLYETHYLENE GLYCOL 3350 17 G PO PACK: 17.0000 g | PACK | Freq: Every day | ORAL | 2 refills | Status: DC | PRN

## 2021-01-05 MED ORDER — ONDANSETRON 8 MG PO TBDP: 8.0000 mg | ORAL_TABLET | Freq: Three times a day (TID) | ORAL | 0 refills | Status: DC | PRN

## 2021-01-05 MED ORDER — FOLIC ACID 1 MG PO TABS: 1.0000 mg | ORAL_TABLET | Freq: Every day | ORAL | 1 refills | Status: AC

## 2021-01-05 NOTE — Progress Notes (Signed)
Initial Nutrition Assessment   INTERVENTION:  Recommend Heart Healthy diet   NUTRITION DIAGNOSIS:   Inadequate oral intake related to acute illness (pancreatitis) as evidenced by per patient/family report (vomiting PTA).   GOAL:  Patient will meet greater than or equal to 90% of their needs   MONITOR:  Diet advancement,PO intake,Labs,Weight trends  REASON FOR ASSESSMENT:   Malnutrition Screening Tool    ASSESSMENT: [name], HTN, Cirrhosis (ETOH), [name], and chronic nausea and vomiting. Presents with acute pancreatitis.   No further vomiting per patient and she is asking for regular food. Home diet regular. Visitor bedside.  Weight history: usual weight 83-87 kg the past 13 months.  Medications: folvite, MVI, Protonix.   Labs: BMP Latest Ref Rng & Units 01/05/2021 01/04/2021 01/03/2021  Glucose 70 - 99 mg/dL 76 95 102(H)  BUN 8 - 23 mg/dL 7(L) 8 8  Creatinine 0.44 - 1.00 mg/dL 0.60 0.58 0.79  BUN/Creat Ratio 6 - 22 (calc) - - -  Sodium 135 - 145 mmol/L 138 138 136  Potassium 3.5 - 5.1 mmol/L 3.4(L) 3.6 4.0  Chloride 98 - 111 mmol/L 110 110 105  CO2 22 - 32 mmol/L 22 23 26   Calcium 8.9 - 10.3 mg/dL 10.0 10.1 11.1(H)     NUTRITION - FOCUSED PHYSICAL EXAM: Nutrition-Focused physical exam completed. Findings are no fat depletion, mild temple and patellar muscle depletion, and no edema.     Diet Order:   Diet Order            Diet full liquid Room service appropriate? Yes; Fluid consistency: Thin  Diet effective now                 EDUCATION NEEDS:  Education needs have been addressed  Skin:  Skin Assessment: Reviewed RN Assessment  Last BM:  6/4  Height:   Ht Readings from Last 1 Encounters:  10/21/20 5\' 6"  (1.676 m)    Weight:   Wt Readings from Last 1 Encounters:  10/21/20 83 kg    Ideal Body Weight:   59 kg  BMI:  There is no height or weight on file to calculate BMI.  Estimated Nutritional Needs:   Kcal:   1800-1900  Protein:  89-95 gr  Fluid:  >1800 ml daily   Colman Cater MS,RD,CSG,LDN Contact: [email or web address]

## 2021-01-05 NOTE — Discharge Summary (Signed)
Physician Discharge Summary  Colleen Lee MOQ:[id number] DOB: 1956/06/24 DOA: 01/03/2021  PCP: Rosita Fire, MD  Admit date: 01/03/2021 Discharge date: 01/05/2021  Time spent: 35 minutes  Recommendations for Outpatient Follow-up:  1. Repeat complete metabolic panel to follow twice renal function 2. Reassess blood pressure and adjust antihypertensive treatment as needed 3. Continue assisting patient with alcohol cessation/abstinence.   Discharge Diagnoses:  Principal Problem:   Acute pancreatitis Active Problems:   Chronic [name] with cirrhosis (HCC)   Asthma   Alcoholic cirrhosis of liver without ascites (HCC)   Essential hypertension   Discharge Condition: Stable and improved.  Discharged home with instruction to follow-up with PCP in 10 days  CODE STATUS: Full code.  Diet recommendation: Heart healthy diet   Brief history of present illness:   65 year old female with a history of [name], alcoholic liver cirrhosis, hypertension, previous history of pancreatitis, admitted to the hospital with abdominal pain and vomiting.  She was noted to have elevated lipase and CT evidence of acute pancreatitis.  She was admitted for further management.  Hospital Course:  Acute pancreatitis -Suspect is related to alcohol -LFTs were otherwise unremarkable -She does not have any evidence of cholelithiasis and currently not demonstrating abdominal pain or right upper quadrant discomfort. -Patient afebrile and with normal WBCs. -Unremarkable triglycerides -Continue as needed antiemetics and analgesics; no nausea vomiting and tolerating diet at discharge.  Alcoholic liver disease with cirrhosis -CIWA protocol use while inpatient -No withdrawal symptoms appreciated -Patient advised to maintain complete alcohol abstinence -Thiamine and folic acid prescribed at discharge.  Presumed UTI -Urinalysis indicates possible infection -Started on ceftriaxone empirically and once  tolerated by mouth transition to cefdinir orally to complete antibiotic therapy -At time of discharge no complaining of dysuria. -Final culture results pending at discharge; so far no growth identified.  Hypertension -Continue hydralazine and labetalol  [name] -Completed treatment with [facility] in 2015 -Continue outpatient follow-up with gastroenterology service.  Asthma -No wheezing at this time -Continue bronchodilators as needed  GERD -Continue PPI  Procedures:  See below for x-ray reports.  Consultations:  None  Discharge Exam: Vitals:   01/05/21 1257 01/05/21 1355  BP: 110/71 93/67  Pulse: 90 90  Resp: 20   Temp:  99.2 F (37.3 C)  SpO2: 93% 95%    General: No chest pain, no nausea, no vomiting, and currently no abdominal pain; reports improvement in his symptoms and feeling ready to go home. Cardiovascular: S1 and S2, no rubs, no gallops, no JVD. Respiratory: Clear to auscultation bilaterally; no using accessory muscles. Abdomen: Soft, nontender, positive bowel sounds. Extremities: No cyanosis or clubbing.  Discharge Instructions   Discharge Instructions    Diet - low sodium heart healthy   Complete by: As directed    Discharge instructions   Complete by: As directed    Stop alcohol consumption Maintain adequate hydration Follow heart healthy/low-fat diet Take medications as prescribed Arrange follow-up with PCP in 10 days   Increase activity slowly   Complete by: As directed      Allergies as of 01/05/2021      Reactions   Penicillins Rash   Did it involve swelling of the face/tongue/throat, SOB, or low BP? No Did it involve sudden or severe rash/hives, skin peeling, or any reaction on the inside of your mouth or nose? No Did you need to seek medical attention at a hospital or doctor's office? No When did it last happen?Many years ago If all above answers are "NO",  may proceed with cephalosporin use.      Medication List     STOP taking these medications   cyclobenzaprine 5 MG tablet Commonly known as: FLEXERIL   ibuprofen 800 MG tablet Commonly known as: ADVIL   megestrol 40 MG tablet Commonly known as: MEGACE   ondansetron 4 MG tablet Commonly known as: ZOFRAN   oxyCODONE-acetaminophen 5-325 MG tablet Commonly known as: Percocet   polyethylene glycol powder 17 GM/SCOOP powder Commonly known as: MiraLax Replaced by: polyethylene glycol 17 g packet     TAKE these medications   albuterol 108 (90 Base) MCG/ACT inhaler Commonly known as: VENTOLIN HFA Inhale 2 puffs into the lungs every 6 (six) hours as needed for wheezing or shortness of breath.   B-12 2500 MCG Tabs Take 2,500 mcg by mouth daily.   cefdinir 300 MG capsule Commonly known as: OMNICEF Take 1 capsule (300 mg total) by mouth 2 (two) times daily.   folic acid 1 MG tablet Commonly known as: FOLVITE Take 1 tablet (1 mg total) by mouth daily. Start taking on: January 06, 2021   gabapentin 100 MG capsule Commonly known as: NEURONTIN Take 1 capsule (100 mg total) by mouth 3 (three) times daily. What changed:   when to take this  additional instructions   hydrALAZINE 50 MG tablet Commonly known as: APRESOLINE Take 1 tablet (50 mg total) by mouth every 8 (eight) hours.   HYDROcodone-acetaminophen 5-325 MG tablet Commonly known as: NORCO/VICODIN Take 1 tablet by mouth 3 (three) times daily as needed for pain.   hydrOXYzine 10 MG tablet Commonly known as: ATARAX/VISTARIL Take 10 mg by mouth daily.   labetalol 100 MG tablet Commonly known as: NORMODYNE Take 2 tablets (200 mg total) by mouth 2 (two) times daily. What changed:   how much to take  when to take this   naloxone 4 MG/0.1ML Liqd nasal spray kit Commonly known as: NARCAN See admin instructions. ADMINISTER A SINGLE SPRAYEOF NARCAN INTO ONECNOSTRIL. REPEAT AFTER 3SMINUTES IN OTHER NOSTRIL IF NO RESPONSE.   ondansetron 8 MG disintegrating tablet Commonly known  as: Zofran ODT Take 1 tablet (8 mg total) by mouth every 8 (eight) hours as needed for nausea or vomiting.   pantoprazole 40 MG tablet Commonly known as: PROTONIX TAKE (1) TABLET BY MOUTH DAILY 30 MINUTES BEFORE BREAKFAST. What changed: See the new instructions.   polyethylene glycol 17 g packet Commonly known as: MiraLax Take 17 g by mouth daily as needed for mild constipation. Replaces: polyethylene glycol powder 17 GM/SCOOP powder   potassium chloride 10 MEQ CR capsule Commonly known as: MICRO-K Take 20 mEq by mouth daily.   thiamine 100 MG tablet Take 1 tablet (100 mg total) by mouth daily. Start taking on: January 06, 2021   Trulance 3 MG Tabs Generic drug: Plecanatide Take 3 mg by mouth daily.      Allergies  Allergen Reactions  . Penicillins Rash    Did it involve swelling of the face/tongue/throat, SOB, or low BP? No Did it involve sudden or severe rash/hives, skin peeling, or any reaction on the inside of your mouth or nose? No Did you need to seek medical attention at a hospital or doctor's office? No When did it last happen?Many years ago If all above answers are "NO", may proceed with cephalosporin use.      Follow-up Information    Rosita Fire, MD. Schedule an appointment as soon as possible for a visit in 10 day(s).   Specialty: Internal Medicine  Contact information: [place] [address] [phone]               The results of significant diagnostics from this hospitalization (including imaging, microbiology, ancillary and laboratory) are listed below for reference.    Significant Diagnostic Studies: CT ABDOMEN PELVIS WO CONTRAST  Result Date: 01/03/2021 CLINICAL DATA:  Abdominal pain and vomiting. EXAM: CT ABDOMEN AND PELVIS WITHOUT CONTRAST TECHNIQUE: Multidetector CT imaging of the abdomen and pelvis was performed following the standard protocol without IV contrast. COMPARISON:  09/27/2020 FINDINGS: Lower chest:  Descending thoracic aortic atherosclerotic vascular calcification. Hepatobiliary: Common bile duct is mildly prominent at 0.8 cm on image 29 series 2, similar to prior exams such 09/09/2020. The liver appears otherwise unremarkable in noncontrast CT appearance. Pancreas: Worsen bilateral peripancreatic inflammatory stranding favoring acute pancreatitis. Acute peripancreatic fluid but no obvious pseudocyst or abscess. Today's noncontrast exam does not evaluate for pancreatic necrosis. Spleen: Unremarkable Adrenals/Urinary Tract: Unremarkable Stomach/Bowel: Unremarkable Vascular/Lymphatic: Aortoiliac atherosclerotic vascular disease. Reproductive: Calcified uterine fibroids. Other: No supplemental non-categorized findings. Musculoskeletal: Unremarkable IMPRESSION: 1. Acute pancreatitis.  No pseudocyst identified. 2. Chronic mild extrahepatic biliary dilatation. 3.  Aortic Atherosclerosis (ICD10-I70.0). 4. Calcified uterine fibroids. Electronically Signed   By: Van Clines M.D.   On: 01/03/2021 16:08    Microbiology: Recent Results (from the past 240 hour(s))  SARS CORONAVIRUS 2 (TAT 6-24 HRS) Nasopharyngeal Nasopharyngeal Swab     Status: None   Collection Time: 01/03/21  6:30 PM   Specimen: Nasopharyngeal Swab  Result Value Ref Range Status   SARS Coronavirus 2 NEGATIVE NEGATIVE Final    Comment: (NOTE) SARS-CoV-2 target nucleic acids are NOT DETECTED.  The SARS-CoV-2 RNA is generally detectable in upper and lower respiratory specimens during the acute phase of infection. Negative results do not preclude SARS-CoV-2 infection, do not rule out co-infections with other pathogens, and should not be used as the sole basis for treatment or other patient management decisions. Negative results must be combined with clinical observations, patient history, and epidemiological information. The expected result is Negative.  Fact Sheet for Patients: SugarRoll.be  Fact  Sheet for Healthcare Providers: https://www.woods-mathews.com/  This test is not yet approved or cleared by the [place] FDA and  has been authorized for detection and/or diagnosis of SARS-CoV-2 by FDA under an Emergency Use Authorization (EUA). This EUA will remain  in effect (meaning this test can be used) for the duration of the COVID-19 declaration under Se ction 564(b)(1) of the Act, 21 U.S.C. section 360bbb-3(b)(1), unless the authorization is terminated or revoked sooner.  Performed at [facility], [place] [address] [place], [place] [address]      Labs: Basic Metabolic Panel: Recent Labs  Lab 01/03/21 1434 01/03/21 1927 01/04/21 0354 01/05/21 0635  NA 136  --  138 138  K 4.0  --  3.6 3.4*  CL 105  --  110 110  CO2 26  --  23 22  GLUCOSE 102*  --  95 76  BUN 8  --  8 7*  CREATININE 0.79  --  0.58 0.60  CALCIUM 11.1*  --  10.1 10.0  MG  --  1.9  --   --   PHOS  --  2.7  --   --    Liver Function Tests: Recent Labs  Lab 01/03/21 1434 01/05/21 0635  AST 17 15  ALT 15 11  ALKPHOS 84 68  BILITOT 0.8 0.7  PROT 8.0 6.2*  ALBUMIN 3.9 2.9*  Recent Labs  Lab 01/03/21 1434 01/05/21 0635  LIPASE 263* 60*   CBC: Recent Labs  Lab 01/03/21 1434 01/04/21 0354 01/05/21 0635  WBC 9.3 6.6 6.4  HGB 14.1 11.9* 11.4*  HCT 42.9 37.2 35.3*  MCV 97.1 99.2 98.9  PLT 258 213 190    Signed:  Barton Dubois MD.  [facility] 01/05/2021, 4:12 PM

## 2021-01-05 NOTE — Progress Notes (Signed)
Patient states understanding of discharge instructions.  

## 2021-01-05 NOTE — Discharge Instructions (Signed)
Pancreatitis Eating Plan Pancreatitis is when your pancreas becomes irritated and swollen (inflamed). The pancreas is a small organ located behind your stomach. It helps your body digest food and regulate your blood sugar. Pancreatitis can affect how your body digests food, especially foods with fat. You may also have other symptoms such as abdominal pain or nausea. When you have pancreatitis, following a low-fat eating plan may help you manage symptoms and recover more quickly. Work with your health care provider or a diet and nutrition specialist (dietitian) to create an eating plan that is right for you. What are tips for following this plan? Reading food labels Use the information on food labels to help keep track of how much fat you eat:  Check the serving size.  Look for the amount of total fat in grams (g) in one serving. ? Low-fat foods have 3 g of fat or less per serving. ? Fat-free foods have 0.5 g of fat or less per serving.  Keep track of how much fat you eat based on how many servings you eat. ? For example, if you eat two servings, the amount of fat you eat will be two times what is listed on the label. Shopping  Buy low-fat or nonfat foods, such as: ? Fresh, frozen, or canned fruits and vegetables. ? Grains, including pasta, bread, and rice. ? Lean meat, poultry, fish, and other protein foods. ? Low-fat or nonfat dairy.  Avoid buying bakery products and other sweets made with whole milk, butter, and eggs.  Avoid buying snack foods with added fat, such as anything with butter or cheese flavoring.   Cooking  Remove skin from poultry, and remove extra fat from meat.  Limit the amount of fat and oil you use to 6 teaspoons or less per day.  Cook using low-fat methods, such as boiling, broiling, grilling, steaming, or baking.  Use spray oil to cook. Add fat-free chicken broth to add flavor and moisture.  Avoid adding cream to thicken soups or sauces. Use other thickeners  such as corn starch or tomato paste. Meal planning  Eat a low-fat diet as told by your dietitian. For most people, this means having no more than 55-65 grams of fat each day.  Eat small, frequent meals throughout the day. For example, you may have 5-6 small meals instead of 3 large meals.  Drink enough fluid to keep your urine pale yellow.  Do not drink alcohol. Talk to your health care provider if you need help stopping.  Limit how much caffeine you have, including black coffee, black and green tea, caffeinated soft drinks, and energy drinks.   General information  Let your health care provider or dietitian know if you have unplanned weight loss on this eating plan.  You may be instructed to follow a clear liquid diet during a flare of symptoms. Talk with your health care provider about how to manage your diet during symptoms of a flare.  Take any vitamins or supplements as told by your health care provider.  Work with a Microbiologist, especially if you have other conditions such as obesity or diabetes mellitus. What foods should I avoid? Fruits Fried fruits. Fruits served with butter or cream. Vegetables Fried vegetables. Vegetables cooked with butter, cheese, or cream. Grains Biscuits, waffles, donuts, pastries, and croissants. Pies and cookies. Butter-flavored popcorn. Regular crackers. Meats and other protein foods Fatty cuts of meat. Poultry with skin. Organ meats. Bacon, sausage, and cold cuts. Whole eggs. Nuts and nut butters. Dairy  Whole and 2% milk. Whole milk yogurt. Whole milk ice cream. Cream and half-and-half. Cream cheese. Sour cream. Cheese. Beverages Wine, beer, and liquor. The items listed above may not be a complete list of foods and beverages to avoid. Contact a dietitian for more information. Summary  Pancreatitis can affect how your body digests food, especially foods with fat.  When you have pancreatitis, it is recommended that you follow a low-fat eating  plan to help you recover more quickly and manage symptoms. For most people, this means limiting fat to no more than 55-65 grams per day.  Do not drink alcohol. Limit the amount of caffeine you have, and drink enough fluid to keep your urine pale yellow. This information is not intended to replace advice given to you by your health care provider. Make sure you discuss any questions you have with your health care provider. Document Revised: 11/09/2018 Document Reviewed: 10/25/2017 Elsevier Patient Education  [place].

## 2021-01-06 LAB — URINE CULTURE

## 2021-01-09 DIAGNOSIS — E7800 Pure hypercholesterolemia, unspecified: Secondary | ICD-10-CM | POA: Diagnosis not present

## 2021-01-09 DIAGNOSIS — M5432 Sciatica, left side: Secondary | ICD-10-CM | POA: Diagnosis not present

## 2021-01-09 DIAGNOSIS — M109 Gout, unspecified: Secondary | ICD-10-CM | POA: Diagnosis not present

## 2021-01-09 DIAGNOSIS — I10 Essential (primary) hypertension: Secondary | ICD-10-CM | POA: Diagnosis not present

## 2021-01-26 ENCOUNTER — Emergency Department (HOSPITAL_COMMUNITY): Payer: Medicare Other

## 2021-01-26 ENCOUNTER — Emergency Department (HOSPITAL_COMMUNITY)
Admission: EM | Admit: 2021-01-26 | Discharge: 2021-01-26 | Disposition: A | Discharge status: Home / Self Care | Payer: Medicare Other | Attending: Emergency Medicine | Admitting: Emergency Medicine

## 2021-01-26 ENCOUNTER — Other Ambulatory Visit: Payer: Self-pay

## 2021-01-26 ENCOUNTER — Encounter (HOSPITAL_COMMUNITY): Payer: Self-pay | Admitting: *Deleted

## 2021-01-26 DIAGNOSIS — I10 Essential (primary) hypertension: Secondary | ICD-10-CM | POA: Insufficient documentation

## 2021-01-26 DIAGNOSIS — R1013 Epigastric pain: Secondary | ICD-10-CM | POA: Insufficient documentation

## 2021-01-26 DIAGNOSIS — R110 Nausea: Secondary | ICD-10-CM | POA: Diagnosis not present

## 2021-01-26 DIAGNOSIS — Z96652 Presence of left artificial knee joint: Secondary | ICD-10-CM | POA: Insufficient documentation

## 2021-01-26 DIAGNOSIS — J45909 Unspecified asthma, uncomplicated: Secondary | ICD-10-CM | POA: Diagnosis not present

## 2021-01-26 DIAGNOSIS — F17210 Nicotine dependence, cigarettes, uncomplicated: Secondary | ICD-10-CM | POA: Insufficient documentation

## 2021-01-26 DIAGNOSIS — R109 Unspecified abdominal pain: Secondary | ICD-10-CM | POA: Diagnosis not present

## 2021-01-26 DIAGNOSIS — K219 Gastro-esophageal reflux disease without esophagitis: Secondary | ICD-10-CM | POA: Diagnosis not present

## 2021-01-26 LAB — COMPREHENSIVE METABOLIC PANEL
ALT: 16 U/L (ref 0–44)
AST: 23 U/L (ref 15–41)
Albumin: 3.8 g/dL (ref 3.5–5.0)
Alkaline Phosphatase: 74 U/L (ref 38–126)
Anion gap: 7 (ref 5–15)
BUN: 5 mg/dL — ABNORMAL LOW (ref 8–23)
CO2: 23 mmol/L (ref 22–32)
Calcium: 10.9 mg/dL — ABNORMAL HIGH (ref 8.9–10.3)
Chloride: 106 mmol/L (ref 98–111)
Creatinine, Ser: 0.77 mg/dL (ref 0.44–1.00)
GFR, Estimated: >60 mL/min (ref >60)
Glucose, Bld: 95 mg/dL (ref 70–99)
Potassium: 3.7 mmol/L (ref 3.5–5.1)
Sodium: 136 mmol/L (ref 135–145)
Total Bilirubin: 0.9 mg/dL (ref 0.3–1.2)
Total Protein: 7.6 g/dL (ref 6.5–8.1)

## 2021-01-26 LAB — CBC
HCT: 39.9 % (ref 36.0–46.0)
Hemoglobin: 13 g/dL (ref 12.0–15.0)
MCH: 31.9 pg (ref 26.0–34.0)
MCHC: 32.6 g/dL (ref 30.0–36.0)
MCV: 97.8 fL (ref 80.0–100.0)
Platelets: 208 10*3/uL (ref 150–400)
RBC: 4.08 MIL/uL (ref 3.87–5.11)
RDW: 14.4 % (ref 11.5–15.5)
WBC: 5.3 10*3/uL (ref 4.0–10.5)
nRBC: 0 % (ref 0.0–0.2)

## 2021-01-26 LAB — LIPASE, BLOOD: Lipase: 38 U/L (ref 11–51)

## 2021-01-26 MED ORDER — HYDROMORPHONE HCL 1 MG/ML IJ SOLN
1.0000 mg | Freq: Once | INTRAMUSCULAR | Status: AC
  Administered 2021-01-26: 1 mg via INTRAVENOUS
  Filled 2021-01-26: qty 1

## 2021-01-26 MED ORDER — IOHEXOL 300 MG/ML  SOLN
100.0000 mL | Freq: Once | INTRAMUSCULAR | Status: AC | PRN
  Administered 2021-01-26: 100 mL via INTRAVENOUS

## 2021-01-26 MED ORDER — HYDROMORPHONE HCL 1 MG/ML IJ SOLN
1.0000 mg | Freq: Once | INTRAMUSCULAR | Status: AC
Start: 2021-01-26 — End: 2021-01-26
  Administered 2021-01-26: 1 mg via INTRAVENOUS
  Filled 2021-01-26: qty 1

## 2021-01-26 MED ORDER — LACTATED RINGERS IV BOLUS
1000.0000 mL | Freq: Once | INTRAVENOUS | Status: AC
  Administered 2021-01-26: 1000 mL via INTRAVENOUS

## 2021-01-26 MED ORDER — ONDANSETRON HCL 4 MG/2ML IJ SOLN
4.0000 mg | Freq: Once | INTRAMUSCULAR | Status: AC
  Administered 2021-01-26: 4 mg via INTRAVENOUS
  Filled 2021-01-26: qty 2

## 2021-01-26 NOTE — ED Provider Notes (Signed)
[facility] Emergency Department Provider Note MRN:  [id number]  Arrival date & time: 01/26/21     Chief Complaint   Abdominal Pain   History of Present Illness   Colleen Lee is a 65 y.o. year-old female with a history of pancreatitis presenting to the ED with chief complaint of abdominal pain.  Location: Epigastric Duration: 2 days Onset: Gradual Timing: Constant Description: Dull ache Severity: Severe Exacerbating/Alleviating Factors: None Associated Symptoms: Nausea Pertinent Negatives: Denies vomiting, no diarrhea, no fever, no chest pain, no shortness of breath   Review of Systems  A complete 10 system review of systems was obtained and all systems are negative except as noted in the HPI and PMH.   [name] Health History    Past Medical History:  Diagnosis Date   Asthma    Chronic abdominal pain    Chronic back pain    Chronic constipation    Cirrhosis ([place])    Metavir score F4 on elastography 7262   Cyclical vomiting    Fibroids    Fibromyalgia    GERD (gastroesophageal reflux disease)    H. pylori infection 2014   treated with pylera, had to be treated again as it was not eradicated. Urea breath test then negative after subsequent treatment.    [name]    HCV RNA positive 09/2012   Hypertension    Marijuana use    Nausea and vomiting    chronic, recurrent   PONV (postoperative nausea and vomiting)    pt thinks maybe once she had N&V   Sciatica of left side     Past Surgical History:  Procedure Laterality Date   BALLOON DILATION  12/20/2017   Procedure: BALLOON DILATION;  Surgeon: Danie Binder, MD;  [facility];  Service: Endoscopy;;  pyloric dilation   BIOPSY  12/20/2017   Procedure: BIOPSY;  Surgeon: Danie Binder, MD;  [facility];  Service: Endoscopy;;  duodenal and gastric biopsy   COLONOSCOPY  01/18/2008   [id number] rectum.  Long redundant colon, a diminutive sigmoid polyp status post  cold biopsy removed. Hyperplastic polyp. Repeat colonoscopy June 2014 due to family history of colon cancer   COLONOSCOPY WITH ESOPHAGOGASTRODUODENOSCOPY (EGD) N/A 11/02/2012   LAG:TXMIWOEH gastric mucosa of doubtful, +H.pylori. Incomplete colonoscopy due to patient unable to tolerate exam, proximal colon seen. Patient refused ACBE.   COLONOSCOPY WITH PROPOFOL N/A 03/08/2013   Dr. Gala Romney: colonic polyp-removed as scribed above. Internal Hemorrhoids. Pathology did not reveal any colonic tissue, only mucus. SURVEILLANCE DUE Aug 2019   COLONOSCOPY WITH PROPOFOL N/A 01/19/2018   Dr. Gala Romney: 6 mm cecal inflammatoy polyp, otherwise normal., Surveilance in 5 year s   ESOPHAGEAL DILATION  12/20/2017   Procedure: ESOPHAGEAL DILATION;  Surgeon: Danie Binder, MD;  [facility];  Service: Endoscopy;;   ESOPHAGOGASTRODUODENOSCOPY  01/18/2008   RMR: Normal esophagus, normal  stomach   ESOPHAGOGASTRODUODENOSCOPY (EGD) WITH PROPOFOL N/A 02/09/2016   Normal esophagus, small hiatal hernia, portal hypertensive gastropathy, normal second portion of duodenum. Due in July 2019   ESOPHAGOGASTRODUODENOSCOPY (EGD) WITH PROPOFOL N/A 12/20/2017   Dr. Oneida Alar: benign appearing esophageal stenosis s/p dilation, mild pyloric stenosis s/p biopsy and dilation, mild duodenitis.    ESOPHAGOGASTRODUODENOSCOPY (EGD) WITH PROPOFOL N/A 06/12/2020   Rourk: Normal esophagus, portal hypertensive gastropathy, no specimens collected.  Tentatively plan for repeat EGD in 2 years.   HYSTEROSCOPY  05/11/2016   Procedure: HYSTEROSCOPY;  Surgeon: Jonnie Kind, MD;  [facility];  Service: Gynecology;;   KNEE SURGERY     left knee   MULTIPLE EXTRACTIONS WITH ALVEOLOPLASTY N/A 10/15/2013   Procedure: MULTIPLE EXTRACTION WITH ALVEOLOPLASTY;  Surgeon: Georgia Lopes, DDS;  [facility]: [facility];  Service: Oral Surgery;  Laterality: N/A;   POLYPECTOMY N/A 03/08/2013   Procedure: POLYPECTOMY;  Surgeon: Corbin Ade, MD;  Location: [facility];   Service: Endoscopy;  Laterality: N/A;   POLYPECTOMY N/A 05/11/2016   Procedure: REMOVAL OF ENDOMETRIAL POLYP;  Surgeon: Tilda Burrow, MD;  Location: [facility];  Service: Gynecology;  Laterality: N/A;   RESECTION DISTAL CLAVICAL Right 01/30/2019   Procedure: RESECTION DISTAL CLAVICAL;  Surgeon: Vickki Hearing, MD;  Location: [facility];  Service: Orthopedics;  Laterality: Right;   SHOULDER OPEN ROTATOR CUFF REPAIR Right 01/30/2019   Procedure: ROTATOR CUFF REPAIR SHOULDER OPEN;  Surgeon: Vickki Hearing, MD;  Location: [facility];  Service: Orthopedics;  Laterality: Right;  pt to arrive at 7:30 for PICC at 8:00   TOTAL KNEE ARTHROPLASTY Left 02/05/2015   Procedure: LEFT TOTAL KNEE ARTHROPLASTY;  Surgeon: Vickki Hearing, MD;  Location: [facility];  Service: Orthopedics;  Laterality: Left;    Family History  Problem Relation Age of Onset   Colon cancer Brother 73           Multiple myeloma Brother    Liver cancer Sister    Prostate cancer Brother    Pancreatic cancer Brother    Cancer Mother        breast   Asthma Mother     Social History   Socioeconomic History   Marital status: Married    Spouse name: Not on file   Number of children: 0   Years of education: Not on file   Highest education level: Not on file  Occupational History   Occupation: umemployed   Tobacco Use   Smoking status: Every Day    Packs/day: 1.00    Years: 40.00    Pack years: 40.00    Types: Cigarettes   Smokeless tobacco: Never   Tobacco comments:    Smokes one pack of cigarettes every 1.5 days  Vaping Use   Vaping Use: Never used  Substance and Sexual Activity   Alcohol use: Yes    Alcohol/week: 0.0 standard drinks    Comment: 1 bottle of dinner wine daily   Drug use: Not Currently    Types: Marijuana    Comment: history of marijuana in past- almost 2 years ago   Sexual activity: Never    Birth control/protection: None  Other Topics Concern   Not on file  Social History Narrative   Not on file    Social Determinants of Health   Financial Resource Strain: Not on file  Food Insecurity: Not on file  Transportation Needs: Not on file  Physical Activity: Not on file  Stress: Not on file  Social Connections: Not on file  Intimate Partner Violence: Not on file     Physical Exam   Vitals:   01/26/21 1510 01/26/21 1540  BP: (!) 150/97 (!) 141/94  Pulse: 80 89  Resp: 16 16  Temp:    SpO2: 100% 98%    CONSTITUTIONAL: Well-appearing, NAD NEURO:  Alert and oriented x 3, no focal deficits EYES:  eyes equal and reactive ENT/NECK:  no LAD, no JVD CARDIO: Regular rate, well-perfused, normal S1 and S2 PULM:  CTAB no wheezing or rhonchi GI/GU:  normal bowel sounds, non-distended, non-tender MSK/SPINE:  No gross deformities, no edema SKIN:  no rash, atraumatic PSYCH:  Appropriate speech and behavior  *Additional and/or pertinent findings included in MDM below  Diagnostic and Interventional Summary    EKG Interpretation  Date/Time:    Ventricular Rate:    PR Interval:    QRS Duration:   QT Interval:    QTC Calculation:   R Axis:     Text Interpretation:          Labs Reviewed  COMPREHENSIVE METABOLIC PANEL - Abnormal; Notable for the following components:      Result Value   BUN 5 (*)    Calcium 10.9 (*)    All other components within normal limits  CBC  LIPASE, BLOOD    CT ABDOMEN PELVIS W CONTRAST  Final Result      Medications  HYDROmorphone (DILAUDID) injection 1 mg (1 mg Intravenous Given 01/26/21 1250)  lactated ringers bolus 1,000 mL (0 mLs Intravenous Stopped 01/26/21 1401)  ondansetron (ZOFRAN) injection 4 mg (4 mg Intravenous Given 01/26/21 1249)  iohexol (OMNIPAQUE) 300 MG/ML solution 100 mL (100 mLs Intravenous Contrast Given 01/26/21 1418)  HYDROmorphone (DILAUDID) injection 1 mg (1 mg Intravenous Given 01/26/21 1513)     Procedures  /  Critical Care Procedures  ED Course and Medical Decision Making  I have reviewed the triage vital signs,  the nursing notes, and pertinent available records from the EMR.  Listed above are laboratory and imaging tests that I personally ordered, reviewed, and interpreted and then considered in my medical decision making (see below for details).  Suspect acute on chronic pancreatitis, recently admitted for the same.  Providing pain control, CT is pending.     CT is reassuring, pain better controlled, labs are normal, appropriate for discharge with follow-up.  Barth Kirks. Sedonia Small, MD [email or web address]  Final Clinical Impressions(s) / ED Diagnoses     ICD-10-CM   1. Epigastric pain  R10.13       ED Discharge Orders     None        Discharge Instructions Discussed with and Provided to Patient:     Discharge Instructions      You were evaluated in the Emergency Department and after careful evaluation, we did not find any emergent condition requiring admission or further testing in the hospital.  Recommend follow-up with your regular doctor to discuss your symptoms.  Please return to the Emergency Department if you experience any worsening of your condition.  Thank you for allowing [place] to be a part of your care.         Maudie Flakes, MD 01/26/21 [phone]

## 2021-01-26 NOTE — ED Triage Notes (Signed)
Abdominal pain x 2 days °

## 2021-01-26 NOTE — Discharge Instructions (Addendum)
You were evaluated in the Emergency Department and after careful evaluation, we did not find any emergent condition requiring admission or further testing in the hospital.  Recommend follow-up with your regular doctor to discuss your symptoms.  Please return to the Emergency Department if you experience any worsening of your condition.  Thank you for allowing [place] to be a part of your care.

## 2021-01-30 DIAGNOSIS — H2511 Age-related nuclear cataract, right eye: Secondary | ICD-10-CM | POA: Diagnosis not present

## 2021-01-30 DIAGNOSIS — G894 Chronic pain syndrome: Secondary | ICD-10-CM | POA: Diagnosis not present

## 2021-01-30 DIAGNOSIS — F17200 Nicotine dependence, unspecified, uncomplicated: Secondary | ICD-10-CM | POA: Diagnosis not present

## 2021-01-30 DIAGNOSIS — Z79899 Other long term (current) drug therapy: Secondary | ICD-10-CM | POA: Diagnosis not present

## 2021-01-30 DIAGNOSIS — F17210 Nicotine dependence, cigarettes, uncomplicated: Secondary | ICD-10-CM | POA: Diagnosis not present

## 2021-01-30 DIAGNOSIS — M5090 Cervical disc disorder, unspecified, unspecified cervical region: Secondary | ICD-10-CM | POA: Diagnosis not present

## 2021-01-30 DIAGNOSIS — R030 Elevated blood-pressure reading, without diagnosis of hypertension: Secondary | ICD-10-CM | POA: Diagnosis not present

## 2021-01-30 DIAGNOSIS — Z01818 Encounter for other preprocedural examination: Secondary | ICD-10-CM | POA: Diagnosis not present

## 2021-02-11 DIAGNOSIS — H25811 Combined forms of age-related cataract, right eye: Secondary | ICD-10-CM | POA: Diagnosis not present

## 2021-02-11 DIAGNOSIS — H2511 Age-related nuclear cataract, right eye: Secondary | ICD-10-CM | POA: Diagnosis not present

## 2021-02-24 DIAGNOSIS — J449 Chronic obstructive pulmonary disease, unspecified: Secondary | ICD-10-CM | POA: Diagnosis not present

## 2021-02-24 DIAGNOSIS — I10 Essential (primary) hypertension: Secondary | ICD-10-CM | POA: Diagnosis not present

## 2021-03-06 DIAGNOSIS — Z79899 Other long term (current) drug therapy: Secondary | ICD-10-CM | POA: Diagnosis not present

## 2021-03-06 DIAGNOSIS — M5090 Cervical disc disorder, unspecified, unspecified cervical region: Secondary | ICD-10-CM | POA: Diagnosis not present

## 2021-03-06 DIAGNOSIS — R030 Elevated blood-pressure reading, without diagnosis of hypertension: Secondary | ICD-10-CM | POA: Diagnosis not present

## 2021-03-06 DIAGNOSIS — G894 Chronic pain syndrome: Secondary | ICD-10-CM | POA: Diagnosis not present

## 2021-03-06 DIAGNOSIS — F17210 Nicotine dependence, cigarettes, uncomplicated: Secondary | ICD-10-CM | POA: Diagnosis not present

## 2021-03-06 DIAGNOSIS — F17200 Nicotine dependence, unspecified, uncomplicated: Secondary | ICD-10-CM | POA: Diagnosis not present

## 2021-03-11 DIAGNOSIS — H2512 Age-related nuclear cataract, left eye: Secondary | ICD-10-CM | POA: Diagnosis not present

## 2021-03-11 DIAGNOSIS — H25812 Combined forms of age-related cataract, left eye: Secondary | ICD-10-CM | POA: Diagnosis not present

## 2021-03-24 ENCOUNTER — Encounter: Payer: Self-pay | Admitting: Internal Medicine

## 2021-03-24 ENCOUNTER — Ambulatory Visit: Payer: Medicare Other | Admitting: Gastroenterology

## 2021-03-27 DIAGNOSIS — J449 Chronic obstructive pulmonary disease, unspecified: Secondary | ICD-10-CM | POA: Diagnosis not present

## 2021-03-27 DIAGNOSIS — I10 Essential (primary) hypertension: Secondary | ICD-10-CM | POA: Diagnosis not present

## 2021-04-01 DIAGNOSIS — G894 Chronic pain syndrome: Secondary | ICD-10-CM | POA: Diagnosis not present

## 2021-04-01 DIAGNOSIS — F17210 Nicotine dependence, cigarettes, uncomplicated: Secondary | ICD-10-CM | POA: Diagnosis not present

## 2021-04-01 DIAGNOSIS — R030 Elevated blood-pressure reading, without diagnosis of hypertension: Secondary | ICD-10-CM | POA: Diagnosis not present

## 2021-04-01 DIAGNOSIS — F17200 Nicotine dependence, unspecified, uncomplicated: Secondary | ICD-10-CM | POA: Diagnosis not present

## 2021-04-01 DIAGNOSIS — M5090 Cervical disc disorder, unspecified, unspecified cervical region: Secondary | ICD-10-CM | POA: Diagnosis not present

## 2021-04-01 DIAGNOSIS — Z79899 Other long term (current) drug therapy: Secondary | ICD-10-CM | POA: Diagnosis not present

## 2021-04-15 DIAGNOSIS — R5383 Other fatigue: Secondary | ICD-10-CM | POA: Diagnosis not present

## 2021-04-15 DIAGNOSIS — M129 Arthropathy, unspecified: Secondary | ICD-10-CM | POA: Diagnosis not present

## 2021-04-15 DIAGNOSIS — E7800 Pure hypercholesterolemia, unspecified: Secondary | ICD-10-CM | POA: Diagnosis not present

## 2021-04-15 DIAGNOSIS — Z87891 Personal history of nicotine dependence: Secondary | ICD-10-CM | POA: Diagnosis not present

## 2021-04-15 DIAGNOSIS — I10 Essential (primary) hypertension: Secondary | ICD-10-CM | POA: Diagnosis not present

## 2021-04-15 DIAGNOSIS — Z0000 Encounter for general adult medical examination without abnormal findings: Secondary | ICD-10-CM | POA: Diagnosis not present

## 2021-04-15 DIAGNOSIS — Z79899 Other long term (current) drug therapy: Secondary | ICD-10-CM | POA: Diagnosis not present

## 2021-04-15 DIAGNOSIS — F17210 Nicotine dependence, cigarettes, uncomplicated: Secondary | ICD-10-CM | POA: Diagnosis not present

## 2021-04-15 DIAGNOSIS — E559 Vitamin D deficiency, unspecified: Secondary | ICD-10-CM | POA: Diagnosis not present

## 2021-04-20 ENCOUNTER — Encounter (HOSPITAL_COMMUNITY): Payer: Self-pay | Admitting: *Deleted

## 2021-04-20 ENCOUNTER — Other Ambulatory Visit: Payer: Self-pay

## 2021-04-20 ENCOUNTER — Emergency Department (HOSPITAL_COMMUNITY)
Admission: EM | Admit: 2021-04-20 | Discharge: 2021-04-20 | Disposition: A | Discharge status: Home / Self Care | Payer: Medicare Other | Attending: Emergency Medicine | Admitting: Emergency Medicine

## 2021-04-20 DIAGNOSIS — K859 Acute pancreatitis without necrosis or infection, unspecified: Secondary | ICD-10-CM | POA: Insufficient documentation

## 2021-04-20 DIAGNOSIS — Z96652 Presence of left artificial knee joint: Secondary | ICD-10-CM | POA: Insufficient documentation

## 2021-04-20 DIAGNOSIS — J45909 Unspecified asthma, uncomplicated: Secondary | ICD-10-CM | POA: Diagnosis not present

## 2021-04-20 DIAGNOSIS — I10 Essential (primary) hypertension: Secondary | ICD-10-CM | POA: Insufficient documentation

## 2021-04-20 DIAGNOSIS — F17210 Nicotine dependence, cigarettes, uncomplicated: Secondary | ICD-10-CM | POA: Insufficient documentation

## 2021-04-20 DIAGNOSIS — R1084 Generalized abdominal pain: Secondary | ICD-10-CM | POA: Diagnosis present

## 2021-04-20 LAB — CBC
HCT: 40.8 % (ref 36.0–46.0)
Hemoglobin: 13.3 g/dL (ref 12.0–15.0)
MCH: 32 pg (ref 26.0–34.0)
MCHC: 32.6 g/dL (ref 30.0–36.0)
MCV: 98.1 fL (ref 80.0–100.0)
Platelets: 186 10*3/uL (ref 150–400)
RBC: 4.16 MIL/uL (ref 3.87–5.11)
RDW: 14.3 % (ref 11.5–15.5)
WBC: 7.1 10*3/uL (ref 4.0–10.5)
nRBC: 0 % (ref 0.0–0.2)

## 2021-04-20 LAB — URINALYSIS, ROUTINE W REFLEX MICROSCOPIC
Bacteria, UA: NONE SEEN
Bilirubin Urine: NEGATIVE
Glucose, UA: NEGATIVE mg/dL
Hgb urine dipstick: NEGATIVE
Ketones, ur: NEGATIVE mg/dL
Leukocytes,Ua: NEGATIVE
Nitrite: NEGATIVE
Protein, ur: 100 mg/dL — AB
Specific Gravity, Urine: 1.031 — ABNORMAL HIGH (ref 1.005–1.030)
pH: 5 (ref 5.0–8.0)

## 2021-04-20 LAB — COMPREHENSIVE METABOLIC PANEL
ALT: 21 U/L (ref 0–44)
AST: 26 U/L (ref 15–41)
Albumin: 4 g/dL (ref 3.5–5.0)
Alkaline Phosphatase: 91 U/L (ref 38–126)
Anion gap: 9 (ref 5–15)
BUN: 10 mg/dL (ref 8–23)
CO2: 21 mmol/L — ABNORMAL LOW (ref 22–32)
Calcium: 10.6 mg/dL — ABNORMAL HIGH (ref 8.9–10.3)
Chloride: 104 mmol/L (ref 98–111)
Creatinine, Ser: 0.9 mg/dL (ref 0.44–1.00)
GFR, Estimated: >60 mL/min (ref >60)
Glucose, Bld: 89 mg/dL (ref 70–99)
Potassium: 3.8 mmol/L (ref 3.5–5.1)
Sodium: 134 mmol/L — ABNORMAL LOW (ref 135–145)
Total Bilirubin: 1.1 mg/dL (ref 0.3–1.2)
Total Protein: 8.6 g/dL — ABNORMAL HIGH (ref 6.5–8.1)

## 2021-04-20 LAB — LIPASE, BLOOD: Lipase: 79 U/L — ABNORMAL HIGH (ref 11–51)

## 2021-04-20 MED ORDER — ONDANSETRON 4 MG PO TBDP
4.0000 mg | ORAL_TABLET | Freq: Once | ORAL | Status: DC
  Filled 2021-04-20: qty 1

## 2021-04-20 MED ORDER — HYDROMORPHONE HCL 1 MG/ML IJ SOLN
1.0000 mg | Freq: Once | INTRAMUSCULAR | Status: DC
  Filled 2021-04-20: qty 1

## 2021-04-20 MED ORDER — ONDANSETRON HCL 4 MG/2ML IJ SOLN
4.0000 mg | Freq: Once | INTRAMUSCULAR | Status: DC
  Filled 2021-04-20: qty 2

## 2021-04-20 MED ORDER — HYDROMORPHONE HCL 1 MG/ML IJ SOLN
1.0000 mg | Freq: Once | INTRAMUSCULAR | Status: AC
Start: 2021-04-20 — End: 2021-04-20
  Administered 2021-04-20: 1 mg via INTRAMUSCULAR
  Filled 2021-04-20: qty 1

## 2021-04-20 MED ORDER — ONDANSETRON 4 MG PO TBDP
4.0000 mg | ORAL_TABLET | Freq: Once | ORAL | Status: AC
  Administered 2021-04-20: 4 mg via ORAL

## 2021-04-20 MED ORDER — HYDROMORPHONE HCL 1 MG/ML IJ SOLN
1.0000 mg | Freq: Once | INTRAMUSCULAR | Status: AC
  Administered 2021-04-20: 1 mg via INTRAMUSCULAR

## 2021-04-20 MED ORDER — HYDROMORPHONE HCL 1 MG/ML IJ SOLN: 1.0000 mg | Freq: Once | INTRAMUSCULAR | Status: DC

## 2021-04-20 NOTE — Discharge Instructions (Addendum)
Follow up with your Physician for recheck  

## 2021-04-20 NOTE — ED Triage Notes (Signed)
Abdominal pain radiating into back for 3 days

## 2021-04-20 NOTE — ED Provider Notes (Signed)
[facility] EMERGENCY DEPARTMENT Provider Note   CSN: [id number] Arrival date & time: 04/20/21  1413     History Chief Complaint  Patient presents with   Abdominal Pain    Colleen Lee is a 65 y.o. female.  Pt has a history of pancreatitis .  Pt reports she began having discomfort after eating macaroni and cheese.  Pt reports no recent alcohol.  No fever no chills    The history is provided by the patient. No language interpreter was used.  Abdominal Pain Pain location:  Generalized Pain quality: aching   Pain radiates to:  Does not radiate Pain severity:  Moderate Onset quality:  Gradual Duration:  2 days Timing:  Constant Progression:  Worsening Chronicity:  New Relieved by:  Nothing Worsened by:  Nothing Ineffective treatments:  None tried Associated symptoms: nausea   Risk factors: no alcohol abuse       Past Medical History:  Diagnosis Date   Asthma    Chronic abdominal pain    Chronic back pain    Chronic constipation    Cirrhosis ([facility])    Metavir score F4 on elastography 6294   Cyclical vomiting    Fibroids    Fibromyalgia    GERD (gastroesophageal reflux disease)    H. pylori infection 2014   treated with pylera, had to be treated again as it was not eradicated. Urea breath test then negative after subsequent treatment.    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/24/2018   Shoulder impingement, right 05/24/2018   Dehydration    Intractable  cyclical vomiting 76/54/6503   Pyloric stenosis in adult    Thrombocytopenia ([place]) 12/20/2017   Leukocytosis    Malnutrition of moderate degree 12/17/2017   Acute renal failure (ARF) ([place]) 12/15/2017   Chronic abdominal pain 12/15/2017   Gastroenteritis 06/14/2016   Nausea with vomiting 06/10/2016   Uterine enlargement 06/09/2016   Intractable nausea and vomiting 06/08/2016   Acute infective gastroenteritis 06/08/2016   Diarrhea 06/08/2016   Essential hypertension 06/08/2016   GERD (gastroesophageal reflux disease) 06/08/2016   Abdominal pain    Endometrial polyp 04/15/2016   PMB (postmenopausal bleeding) 54/65/6812   Alcoholic cirrhosis of liver without ascites ([place])    Asthma 01/21/2016   Hepatic cirrhosis ([place]) 09/22/2015   Arthritis of knee, degenerative 75/17/0017   History of Helicobacter pylori infection 10/30/2014   Chronic [name] with cirrhosis ([place]) 04/11/2014   De Quervain's disease (radial styloid tenosynovitis) 12/25/2013   Anorexia 11/21/2012   FH: colon cancer 11/21/2012   Early satiety 10/25/2012   Bowel habit changes 10/25/2012   Abdominal pain, epigastric 10/25/2012   Abdominal bloating 10/25/2012   Constipation 10/25/2012   Abnormal weight loss 10/25/2012   Chronic viral [name] ([place]) 10/25/2012   Radicular pain of left lower extremity 09/28/2012   Back pain 09/28/2012   Sciatica 08/10/2011  S/P arthroscopy of left knee 08/10/2011   Tibial plateau fracture 08/10/2011   Pain in joint, lower leg 02/12/2011   Stiffness of joint, not elsewhere classified, lower leg 02/12/2011   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  Long redundant colon, a diminutive sigmoid polyp status post cold biopsy removed. Hyperplastic polyp. Repeat colonoscopy June 2014 due to family history of colon cancer   COLONOSCOPY WITH ESOPHAGOGASTRODUODENOSCOPY (EGD) N/A 11/02/2012   [id number]:[id number] gastric mucosa of doubtful, +H.pylori. Incomplete colonoscopy due to patient unable to tolerate exam, proximal colon seen. Patient refused ACBE.   COLONOSCOPY WITH PROPOFOL N/A 03/08/2013   Dr. Gala Romney: colonic polyp-removed as scribed above. Internal Hemorrhoids. Pathology did not reveal any colonic tissue, only mucus. SURVEILLANCE DUE Aug 2019   COLONOSCOPY WITH PROPOFOL N/A 01/19/2018   Dr. Gala Romney: 6 mm cecal inflammatoy polyp, otherwise normal., Surveilance in 5 year s   ESOPHAGEAL DILATION  12/20/2017   Procedure: ESOPHAGEAL DILATION;  Surgeon: Danie Binder, MD;  [facility];  Service: Endoscopy;;   ESOPHAGOGASTRODUODENOSCOPY  01/18/2008   RMR: Normal esophagus, normal  stomach   ESOPHAGOGASTRODUODENOSCOPY (EGD) WITH PROPOFOL N/A 02/09/2016   Normal esophagus, small hiatal hernia, portal hypertensive gastropathy, normal second portion of duodenum. Due in July 2019   ESOPHAGOGASTRODUODENOSCOPY (EGD) WITH PROPOFOL N/A 12/20/2017   Dr. Oneida Alar: benign appearing esophageal stenosis s/p dilation, mild pyloric stenosis s/p biopsy and dilation, mild duodenitis.    ESOPHAGOGASTRODUODENOSCOPY (EGD) WITH PROPOFOL N/A 06/12/2020   Rourk: Normal esophagus, portal hypertensive gastropathy, no specimens collected.  Tentatively plan for repeat EGD in 2 years.   HYSTEROSCOPY  05/11/2016   Procedure: HYSTEROSCOPY;  Surgeon: Jonnie Kind, MD;  Location: [facility];  Service: Gynecology;;   KNEE SURGERY     left knee   MULTIPLE EXTRACTIONS WITH ALVEOLOPLASTY N/A 10/15/2013   Procedure:  MULTIPLE EXTRACTION WITH ALVEOLOPLASTY;  Surgeon: Gae Bon, DDS;  Location: [place];  Service: Oral Surgery;  Laterality: N/A;   POLYPECTOMY N/A 03/08/2013   Procedure: POLYPECTOMY;  Surgeon: Daneil Dolin, MD;  Location: [facility];  Service: Endoscopy;  Laterality: N/A;   POLYPECTOMY N/A 05/11/2016   Procedure: REMOVAL OF ENDOMETRIAL POLYP;  Surgeon: Jonnie Kind, MD;  Location: [facility];  Service: Gynecology;  Laterality: N/A;   RESECTION DISTAL CLAVICAL Right 01/30/2019   Procedure: RESECTION DISTAL CLAVICAL;  Surgeon: Carole Civil, MD;  Location: [facility];  Service: Orthopedics;  Laterality: Right;   SHOULDER OPEN ROTATOR CUFF REPAIR Right 01/30/2019   Procedure: ROTATOR CUFF REPAIR SHOULDER OPEN;  Surgeon: Carole Civil, MD;  Location: [facility];  Service: Orthopedics;  Laterality: Right;  pt to arrive at 7:30 for PICC at 8:00   [place] Left 02/05/2015   Procedure: LEFT TOTAL KNEE ARTHROPLASTY;  Surgeon: Carole Civil, MD;  Location: [facility];  Service: Orthopedics;  Laterality: Left;     OB History     Gravida      Para  Term      Preterm      AB      Living  1      SAB      IAB      Ectopic      Multiple      Live Births              Family History  Problem Relation Age of Onset   Colon cancer Brother 39           Multiple myeloma Brother    Liver cancer Sister    Prostate cancer Brother    Pancreatic cancer Brother    Cancer Mother        breast   Asthma Mother     Social History   Tobacco Use   Smoking status: Every Day    Packs/day: 1.00    Years: 40.00    Pack years: 40.00    Types: Cigarettes   Smokeless tobacco: Never   Tobacco comments:    Smokes one pack of cigarettes every 1.5 days  Vaping Use   Vaping Use: Never used  Substance Use Topics   Alcohol use: Yes    Alcohol/week: 0.0 standard drinks    Comment: 1 bottle of dinner wine daily   Drug use: Not Currently    Types: Marijuana    Comment:  history of marijuana in past- almost 2 years ago    Home Medications Prior to Admission medications   Medication Sig Start Date End Date Taking? Authorizing Provider  albuterol (PROVENTIL HFA;VENTOLIN HFA) 108 (90 Base) MCG/ACT inhaler Inhale 2 puffs into the lungs every 6 (six) hours as needed for wheezing or shortness of breath.    Yes [provider]  allopurinol (ZYLOPRIM) 300 MG tablet Take 300 mg by mouth daily. 04/15/21  Yes [provider]  Cyanocobalamin (B-12) 2500 MCG TABS Take 2,500 mcg by mouth daily.   Yes [provider]  cyclobenzaprine (FLEXERIL) 5 MG tablet Take 5 mg by mouth 3 (three) times daily. 04/15/21  Yes [provider]  folic acid (FOLVITE) 1 MG tablet Take 1 tablet (1 mg total) by mouth daily. 01/06/21  Yes Barton Dubois, MD  gabapentin (NEURONTIN) 100 MG capsule Take 1 capsule (100 mg total) by mouth 3 (three) times daily. Patient taking differently: Take 100 mg by mouth 2 (two) times daily. Takes 1 tablet in the morning and 1 tablet in the evening 12/22/18  Yes Carole Civil, MD  hydrALAZINE (APRESOLINE) 50 MG tablet Take 1 tablet (50 mg total) by mouth every 8 (eight) hours. 01/30/18  Yes Barton Dubois, MD  HYDROcodone-acetaminophen (NORCO/VICODIN) 5-325 MG tablet Take 1 tablet by mouth 3 (three) times daily as needed for pain. 12/04/20  Yes [provider]  hydrOXYzine (ATARAX/VISTARIL) 10 MG tablet Take 10 mg by mouth daily.  11/30/17  Yes [provider]  labetalol (NORMODYNE) 100 MG tablet Take 2 tablets (200 mg total) by mouth 2 (two) times daily. Patient taking differently: Take 100 mg by mouth in the morning. 01/30/18  Yes Barton Dubois, MD  megestrol (MEGACE) 40 MG tablet Take 40 mg by mouth daily. 04/02/21  Yes [provider]  naloxone (NARCAN) nasal spray 4 mg/0.1 mL See admin instructions. ADMINISTER A SINGLE SPRAYEOF NARCAN INTO ONECNOSTRIL. REPEAT AFTER 3SMINUTES IN OTHER NOSTRIL IF NO RESPONSE.  10/03/20  Yes [provider]  ondansetron (ZOFRAN ODT) 8 MG disintegrating tablet Take 1 tablet (8 mg total) by mouth every 8 (  eight) hours as needed for nausea or vomiting. 01/05/21  Yes Barton Dubois, MD  pantoprazole (PROTONIX) 40 MG tablet TAKE (1) TABLET BY MOUTH DAILY 30 MINUTES BEFORE BREAKFAST. Patient taking differently: Take 40 mg by mouth daily. 10/27/20  Yes Aliene Altes S, PA-C  polyethylene glycol (MIRALAX) 17 g packet Take 17 g by mouth daily as needed for mild constipation. 01/05/21  Yes Barton Dubois, MD  potassium chloride (MICRO-K) 10 MEQ CR capsule Take 20 mEq by mouth daily.   Yes [provider]  thiamine 100 MG tablet Take 1 tablet (100 mg total) by mouth daily. 01/06/21  Yes Barton Dubois, MD  TRULANCE 3 MG TABS Take 3 mg by mouth daily. 01/01/21  Yes [provider]  cefdinir (OMNICEF) 300 MG capsule Take 1 capsule (300 mg total) by mouth 2 (two) times daily. Patient not taking: No sig reported 01/05/21   Barton Dubois, MD    Allergies    Penicillins  Review of Systems   Review of Systems  Gastrointestinal:  Positive for abdominal pain and nausea.  All other systems reviewed and are negative.  Physical Exam Updated Vital Signs BP (!) 132/91 (BP Location: Left Arm)   Pulse 100   Temp 99.1 F (37.3 C) (Oral)   Resp 12   SpO2 100%   Physical Exam Vitals and nursing note reviewed.  Constitutional:      Appearance: She is well-developed.  HENT:     Head: Normocephalic.  Cardiovascular:     Rate and Rhythm: Normal rate and regular rhythm.  Pulmonary:     Effort: Pulmonary effort is normal.  Abdominal:     General: Bowel sounds are normal. There is no distension.     Palpations: Abdomen is soft.  Musculoskeletal:        General: Normal range of motion.     Cervical back: Normal range of motion.  Skin:    General: Skin is warm.  Neurological:     Mental Status: She is alert and oriented to person, place, and time.    ED Results  / Procedures / Treatments   Labs (all labs ordered are listed, but only abnormal results are displayed) Labs Reviewed  LIPASE, BLOOD - Abnormal; Notable for the following components:      Result Value   Lipase 79 (*)    All other components within normal limits  COMPREHENSIVE METABOLIC PANEL - Abnormal; Notable for the following components:   Sodium 134 (*)    CO2 21 (*)    Calcium 10.6 (*)    Total Protein 8.6 (*)    All other components within normal limits  CBC  URINALYSIS, ROUTINE W REFLEX MICROSCOPIC    EKG None  Radiology No results found.  Procedures Procedures   Medications Ordered in ED Medications  HYDROmorphone (DILAUDID) injection 1 mg (1 mg Intramuscular Not Given 04/20/21 1854)  ondansetron (ZOFRAN-ODT) disintegrating tablet 4 mg (4 mg Oral Not Given 04/20/21 1855)  HYDROmorphone (DILAUDID) injection 1 mg (1 mg Intramuscular Given 04/20/21 1855)  ondansetron (ZOFRAN-ODT) disintegrating tablet 4 mg (4 mg Oral Given 04/20/21 1853)  HYDROmorphone (DILAUDID) injection 1 mg (1 mg Intramuscular Given 04/20/21 2212)    ED Course  I have reviewed the triage vital signs and the nursing notes.  Pertinent labs & imaging results that were available during my care of the patient were reviewed by me and considered in my medical decision making (see chart for details).    MDM Rules/Calculators/A&P  MDM:  Pt given dilaudid and zofran.  Pt reports relief from pain.  Pt able to drink and eat crackers.  Pt advised  clear liquids x 12 hours then progress diet.  Pt has pain medications at home   Final Clinical Impression(s) / ED Diagnoses Final diagnoses:  Acute pancreatitis, unspecified complication status, unspecified pancreatitis type    Rx / DC Orders ED Discharge Orders     None     An After Visit Summary was printed and given to the patient.    Sidney Ace 04/20/21 2237    Luna Fuse, MD 05/06/21 [phone]

## 2021-05-02 ENCOUNTER — Emergency Department (HOSPITAL_COMMUNITY)
Admission: EM | Admit: 2021-05-02 | Discharge: 2021-05-02 | Disposition: A | Discharge status: Home / Self Care | Payer: Medicare Other | Attending: Emergency Medicine | Admitting: Emergency Medicine

## 2021-05-02 ENCOUNTER — Other Ambulatory Visit: Payer: Self-pay

## 2021-05-02 ENCOUNTER — Encounter (HOSPITAL_COMMUNITY): Payer: Self-pay | Admitting: Emergency Medicine

## 2021-05-02 DIAGNOSIS — Z79899 Other long term (current) drug therapy: Secondary | ICD-10-CM | POA: Insufficient documentation

## 2021-05-02 DIAGNOSIS — K219 Gastro-esophageal reflux disease without esophagitis: Secondary | ICD-10-CM | POA: Insufficient documentation

## 2021-05-02 DIAGNOSIS — R1013 Epigastric pain: Secondary | ICD-10-CM | POA: Diagnosis not present

## 2021-05-02 DIAGNOSIS — R197 Diarrhea, unspecified: Secondary | ICD-10-CM | POA: Diagnosis not present

## 2021-05-02 DIAGNOSIS — I10 Essential (primary) hypertension: Secondary | ICD-10-CM | POA: Diagnosis not present

## 2021-05-02 DIAGNOSIS — Z85038 Personal history of other malignant neoplasm of large intestine: Secondary | ICD-10-CM | POA: Diagnosis not present

## 2021-05-02 DIAGNOSIS — R112 Nausea with vomiting, unspecified: Secondary | ICD-10-CM | POA: Diagnosis not present

## 2021-05-02 DIAGNOSIS — Z96652 Presence of left artificial knee joint: Secondary | ICD-10-CM | POA: Diagnosis not present

## 2021-05-02 DIAGNOSIS — J45909 Unspecified asthma, uncomplicated: Secondary | ICD-10-CM | POA: Insufficient documentation

## 2021-05-02 DIAGNOSIS — F17210 Nicotine dependence, cigarettes, uncomplicated: Secondary | ICD-10-CM | POA: Insufficient documentation

## 2021-05-02 DIAGNOSIS — R803 Bence Jones proteinuria: Secondary | ICD-10-CM | POA: Diagnosis not present

## 2021-05-02 LAB — URINALYSIS, ROUTINE W REFLEX MICROSCOPIC
Bilirubin Urine: NEGATIVE
Glucose, UA: NEGATIVE mg/dL
Hgb urine dipstick: NEGATIVE
Ketones, ur: 5 mg/dL — AB
Leukocytes,Ua: NEGATIVE
Nitrite: NEGATIVE
Protein, ur: 30 mg/dL — AB
Specific Gravity, Urine: 1.021 (ref 1.005–1.030)
pH: 5 (ref 5.0–8.0)

## 2021-05-02 LAB — CBC WITH DIFFERENTIAL/PLATELET
Abs Immature Granulocytes: 0.02 10*3/uL (ref 0.00–0.07)
Basophils Absolute: 0 10*3/uL (ref 0.0–0.1)
Basophils Relative: 0 %
Eosinophils Absolute: 0 10*3/uL (ref 0.0–0.5)
Eosinophils Relative: 1 %
HCT: 42.4 % (ref 36.0–46.0)
Hemoglobin: 13.8 g/dL (ref 12.0–15.0)
Immature Granulocytes: 0 %
Lymphocytes Relative: 16 %
Lymphs Abs: 1.1 10*3/uL (ref 0.7–4.0)
MCH: 32.9 pg (ref 26.0–34.0)
MCHC: 32.5 g/dL (ref 30.0–36.0)
MCV: 101.2 fL — ABNORMAL HIGH (ref 80.0–100.0)
Monocytes Absolute: 0.5 10*3/uL (ref 0.1–1.0)
Monocytes Relative: 6 %
Neutro Abs: 5.4 10*3/uL (ref 1.7–7.7)
Neutrophils Relative %: 77 %
Platelets: 183 10*3/uL (ref 150–400)
RBC: 4.19 MIL/uL (ref 3.87–5.11)
RDW: 14.4 % (ref 11.5–15.5)
WBC: 7 10*3/uL (ref 4.0–10.5)
nRBC: 0 % (ref 0.0–0.2)

## 2021-05-02 LAB — COMPREHENSIVE METABOLIC PANEL
ALT: 27 U/L (ref 0–44)
AST: 34 U/L (ref 15–41)
Albumin: 3.7 g/dL (ref 3.5–5.0)
Alkaline Phosphatase: 76 U/L (ref 38–126)
Anion gap: 9 (ref 5–15)
BUN: 9 mg/dL (ref 8–23)
CO2: 21 mmol/L — ABNORMAL LOW (ref 22–32)
Calcium: 10.9 mg/dL — ABNORMAL HIGH (ref 8.9–10.3)
Chloride: 104 mmol/L (ref 98–111)
Creatinine, Ser: 0.86 mg/dL (ref 0.44–1.00)
GFR, Estimated: >60 mL/min (ref >60)
Glucose, Bld: 91 mg/dL (ref 70–99)
Potassium: 3.8 mmol/L (ref 3.5–5.1)
Sodium: 134 mmol/L — ABNORMAL LOW (ref 135–145)
Total Bilirubin: 1 mg/dL (ref 0.3–1.2)
Total Protein: 7.7 g/dL (ref 6.5–8.1)

## 2021-05-02 LAB — LIPASE, BLOOD: Lipase: 46 U/L (ref 11–51)

## 2021-05-02 MED ORDER — HYDROMORPHONE HCL 1 MG/ML IJ SOLN
1.0000 mg | Freq: Once | INTRAMUSCULAR | Status: AC
  Administered 2021-05-02: 1 mg via INTRAMUSCULAR
  Filled 2021-05-02: qty 1

## 2021-05-02 MED ORDER — ONDANSETRON 8 MG PO TBDP
8.0000 mg | ORAL_TABLET | Freq: Once | ORAL | Status: AC
  Administered 2021-05-02: 8 mg via ORAL
  Filled 2021-05-02: qty 1

## 2021-05-02 NOTE — ED Provider Notes (Signed)
[facility] EMERGENCY DEPARTMENT Provider Note   CSN: [id number] Arrival date & time: 05/02/21  1322     History Chief Complaint  Patient presents with   Abdominal Pain    Colleen Lee is a 65 y.o. female.   Abdominal Pain Associated symptoms: diarrhea, nausea and vomiting   Associated symptoms: no chest pain, no dysuria, no fever, no shortness of breath and no vaginal discharge        Colleen Lee is a 65 y.o. female with past medical history of asthma, chronic abdominal pain, cirrhosis, and alcohol induced pancreatitis who presents to the Emergency Department complaining of diffuse abdominal pain.  Seen here nearly 2 weeks ago with similar symptoms.  States that her pain initially improved on her previous visit but returned several days ago.  She endorses having nausea and intermittent vomiting but able to tolerate liquids.  She describes having sharp pains of her upper abdomen.  She also endorses having some loose stools that are nonbloody or black.  Pain does not radiate to her back.  She denies having fever, chills, chest pain, shortness of breath or dysuria symptoms.  No recent alcohol consumption.  She has a primary care appointment for Monday states that her home pain medication is not helping.   Past Medical History:  Diagnosis Date   Asthma    Chronic abdominal pain    Chronic back pain    Chronic constipation    Cirrhosis (HCC)    Metavir score F4 on elastography 2707   Cyclical vomiting    Fibroids    Fibromyalgia    GERD (gastroesophageal reflux disease)    H. pylori infection 2014   treated with pylera, had to be treated again as it was not eradicated. Urea breath test then negative after subsequent treatment.    [name]    HCV RNA positive 09/2012   Hypertension    Marijuana use    Nausea and vomiting    chronic, recurrent   PONV (postoperative nausea and vomiting)    pt thinks maybe once she had N&V   Sciatica of left side     Patient Active  Problem List   Diagnosis Date Noted   Dysuria 09/16/2020   Acute pancreatitis 10/17/2019   ETOH abuse 10/17/2019   S/P right rotator cuff repair 01/30/19 02/06/2019   Nontraumatic complete tear of right rotator cuff    Arthritis of right acromioclavicular joint    S/P total knee replacement, 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/24/2018   Shoulder impingement, right 05/24/2018   Dehydration    Intractable cyclical vomiting 86/75/4492   Pyloric stenosis in adult    Thrombocytopenia ([place]) 12/20/2017   Leukocytosis    Malnutrition of moderate degree 12/17/2017   Acute renal failure (ARF) ([place]) 12/15/2017   Chronic abdominal pain 12/15/2017   Gastroenteritis 06/14/2016   Nausea with vomiting 06/10/2016   Uterine enlargement 06/09/2016   Intractable nausea and vomiting 06/08/2016   Acute infective gastroenteritis 06/08/2016   Diarrhea 06/08/2016   Essential hypertension 06/08/2016   GERD (gastroesophageal reflux disease) 06/08/2016   Abdominal pain    Endometrial polyp 04/15/2016   PMB (postmenopausal bleeding) 01/00/7121   Alcoholic cirrhosis of liver without ascites ([place])    Asthma 01/21/2016   Hepatic cirrhosis ([place]) 09/22/2015   Arthritis of knee, degenerative 97/58/8325   History of Helicobacter pylori infection 10/30/2014   Chronic [name] with cirrhosis ([place]) 04/11/2014   De Quervain's disease (radial styloid tenosynovitis) 12/25/2013   Anorexia 11/21/2012  FH: colon cancer 11/21/2012   Early satiety 10/25/2012   Bowel habit changes 10/25/2012   Abdominal pain, epigastric 10/25/2012   Abdominal bloating 10/25/2012   Constipation 10/25/2012   Abnormal weight loss 10/25/2012   Chronic viral [name] ([place]) 10/25/2012   Radicular pain of left lower extremity 09/28/2012   Back pain 09/28/2012   Sciatica 08/10/2011   S/P arthroscopy of left knee 08/10/2011   Tibial plateau fracture 08/10/2011   Pain in joint, lower leg 02/12/2011   Stiffness of joint, not elsewhere classified, lower  leg 02/12/2011   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  Long redundant colon, a diminutive sigmoid polyp status post cold biopsy removed. Hyperplastic polyp. Repeat colonoscopy June 2014 due to family history of colon cancer   COLONOSCOPY WITH ESOPHAGOGASTRODUODENOSCOPY (EGD) N/A 11/02/2012   XBL:[id number] gastric mucosa of doubtful, +H.pylori. Incomplete colonoscopy due to patient unable to tolerate exam, proximal colon seen. Patient refused ACBE.   COLONOSCOPY WITH PROPOFOL N/A 03/08/2013   Dr. Gala Romney: colonic polyp-removed as scribed above. Internal Hemorrhoids. Pathology did not reveal any colonic tissue, only mucus. SURVEILLANCE DUE Aug 2019   COLONOSCOPY WITH PROPOFOL N/A 01/19/2018   Dr. Gala Romney: 6 mm cecal inflammatoy polyp, otherwise normal., Surveilance in 5 year s   ESOPHAGEAL DILATION  12/20/2017   Procedure: ESOPHAGEAL DILATION;  Surgeon: Danie Binder, MD;  [facility];  Service: Endoscopy;;   ESOPHAGOGASTRODUODENOSCOPY  01/18/2008   RMR: Normal esophagus, normal  stomach   ESOPHAGOGASTRODUODENOSCOPY (EGD) WITH PROPOFOL N/A 02/09/2016   Normal esophagus, small hiatal hernia, portal hypertensive gastropathy, normal second portion of duodenum. Due in July 2019   ESOPHAGOGASTRODUODENOSCOPY (EGD) WITH PROPOFOL N/A 12/20/2017   Dr. Oneida Alar: benign appearing esophageal stenosis s/p dilation, mild pyloric stenosis s/p biopsy and dilation, mild duodenitis.    ESOPHAGOGASTRODUODENOSCOPY (EGD)  WITH PROPOFOL N/A 06/12/2020   Rourk: Normal esophagus, portal hypertensive gastropathy, no specimens collected.  Tentatively plan for repeat EGD in 2 years.   HYSTEROSCOPY  05/11/2016   Procedure: HYSTEROSCOPY;  Surgeon: Jonnie Kind, MD;  Location: [facility];  Service: Gynecology;;   KNEE SURGERY     left knee   MULTIPLE EXTRACTIONS WITH ALVEOLOPLASTY N/A 10/15/2013   Procedure: MULTIPLE EXTRACTION WITH ALVEOLOPLASTY;  Surgeon: Gae Bon, DDS;  Location: [facility];  Service: Oral Surgery;  Laterality: N/A;   POLYPECTOMY N/A 03/08/2013   Procedure: POLYPECTOMY;  Surgeon: Daneil Dolin, MD;  Location: [facility];  Service: Endoscopy;  Laterality: N/A;   POLYPECTOMY N/A 05/11/2016   Procedure: REMOVAL OF ENDOMETRIAL POLYP;  Surgeon: Jonnie Kind, MD;  Location: [facility];  Service: Gynecology;  Laterality: N/A;   RESECTION DISTAL CLAVICAL Right 01/30/2019   Procedure: RESECTION DISTAL CLAVICAL;  Surgeon: Carole Civil, MD;  Location: [facility];  Service: Orthopedics;  Laterality: Right;   SHOULDER OPEN ROTATOR CUFF REPAIR Right 01/30/2019   Procedure: ROTATOR CUFF REPAIR SHOULDER OPEN;  Surgeon: Carole Civil, MD;  Location: [facility];  Service:  Orthopedics;  Laterality: Right;  pt to arrive at 7:30 for PICC at 8:00   [place] Left 02/05/2015   Procedure: LEFT TOTAL KNEE ARTHROPLASTY;  Surgeon: Carole Civil, MD;  Location: [facility];  Service: Orthopedics;  Laterality: Left;     OB History     Gravida      Para      Term      Preterm      AB      Living  1      SAB      IAB      Ectopic      Multiple      Live Births              Family History  Problem Relation Age of Onset   Colon cancer Brother 60           Multiple myeloma Brother    Liver cancer Sister    Prostate cancer Brother    Pancreatic cancer Brother    Cancer Mother        breast   Asthma Mother     Social History   Tobacco Use   Smoking status: Every Day    Packs/day:  1.00    Years: 40.00    Pack years: 40.00    Types: Cigarettes   Smokeless tobacco: Never   Tobacco comments:    Smokes one pack of cigarettes every 1.5 days  Vaping Use   Vaping Use: Never used  Substance Use Topics   Alcohol use: Yes    Alcohol/week: 0.0 standard drinks    Comment: 1 bottle of dinner wine daily   Drug use: Not Currently    Types: Marijuana    Comment: history of marijuana in past- almost 2 years ago    Home Medications Prior to Admission medications   Medication Sig Start Date End Date Taking? Authorizing Provider  albuterol (PROVENTIL HFA;VENTOLIN HFA) 108 (90 Base) MCG/ACT inhaler Inhale 2 puffs into the lungs every 6 (six) hours as needed for wheezing or shortness of breath.     [provider]  allopurinol (ZYLOPRIM) 300 MG tablet Take 300 mg by mouth daily. 04/15/21   [provider]  cefdinir (OMNICEF) 300 MG capsule Take 1 capsule (300 mg total) by mouth 2 (two) times daily. Patient not taking: No sig reported 01/05/21   Barton Dubois, MD  Cyanocobalamin (B-12) 2500 MCG TABS Take 2,500 mcg by mouth daily.    [provider]  cyclobenzaprine (FLEXERIL) 5 MG tablet Take 5 mg by mouth 3 (three) times daily. 04/15/21   [provider]  folic acid (FOLVITE) 1 MG tablet Take 1 tablet (1 mg total) by mouth daily. 01/06/21   Barton Dubois, MD  gabapentin (NEURONTIN) 100 MG capsule Take 1 capsule (100 mg total) by mouth 3 (three) times daily. Patient taking differently: Take 100 mg by mouth 2 (two) times daily. Takes 1 tablet in the morning and 1 tablet in the evening 12/22/18   Carole Civil, MD  hydrALAZINE (APRESOLINE) 50 MG tablet Take 1 tablet (50 mg total) by mouth every 8 (eight) hours. 01/30/18   Barton Dubois, MD  HYDROcodone-acetaminophen (NORCO/VICODIN) 5-325 MG tablet Take 1 tablet by mouth 3 (three) times daily as needed for pain. 12/04/20   [provider]  hydrOXYzine (ATARAX/VISTARIL) 10 MG tablet Take 10 mg  by mouth daily.  11/30/17   [provider]  labetalol (NORMODYNE) 100 MG tablet Take 2  tablets (200 mg total) by mouth 2 (two) times daily. Patient taking differently: Take 100 mg by mouth in the morning. 01/30/18   Barton Dubois, MD  megestrol (MEGACE) 40 MG tablet Take 40 mg by mouth daily. 04/02/21   [provider]  naloxone [facility]) nasal spray 4 mg/0.1 mL See admin instructions. ADMINISTER A SINGLE SPRAYEOF NARCAN INTO ONECNOSTRIL. REPEAT AFTER 3SMINUTES IN OTHER NOSTRIL IF NO RESPONSE. 10/03/20   [provider]  ondansetron (ZOFRAN ODT) 8 MG disintegrating tablet Take 1 tablet (8 mg total) by mouth every 8 (eight) hours as needed for nausea or vomiting. 01/05/21   Barton Dubois, MD  pantoprazole (PROTONIX) 40 MG tablet TAKE (1) TABLET BY MOUTH DAILY 30 MINUTES BEFORE BREAKFAST. Patient taking differently: Take 40 mg by mouth daily. 10/27/20   Erenest Rasher, PA-C  polyethylene glycol (MIRALAX) 17 g packet Take 17 g by mouth daily as needed for mild constipation. 01/05/21   Barton Dubois, MD  potassium chloride (MICRO-K) 10 MEQ CR capsule Take 20 mEq by mouth daily.    [provider]  thiamine 100 MG tablet Take 1 tablet (100 mg total) by mouth daily. 01/06/21   Barton Dubois, MD  TRULANCE 3 MG TABS Take 3 mg by mouth daily. 01/01/21   [provider]    Allergies    Penicillins  Review of Systems   Review of Systems  Constitutional:  Positive for appetite change. Negative for fever.  Respiratory:  Negative for chest tightness and shortness of breath.   Cardiovascular:  Negative for chest pain and leg swelling.  Gastrointestinal:  Positive for abdominal pain, diarrhea, nausea and vomiting.  Genitourinary:  Negative for decreased urine volume, difficulty urinating, dysuria, flank pain and vaginal discharge.  Musculoskeletal:  Negative for arthralgias and back pain.  Skin:  Negative for rash.  Neurological:  Negative for dizziness, syncope, speech  difficulty and headaches.   Physical Exam Updated Vital Signs BP (!) 128/96   Pulse 91   Temp 98.4 F (36.9 C) (Oral)   Resp 18   Ht _0  (1.676 m)   Wt 76.7 kg   SpO2 100%   BMI 27.28 kg/m   Physical Exam Vitals and nursing note reviewed.  Constitutional:      General: She is not in acute distress.    Appearance: Normal appearance. She is well-developed. She is not ill-appearing or toxic-appearing.  HENT:     Mouth/Throat:     Mouth: Mucous membranes are moist.  Cardiovascular:     Rate and Rhythm: Normal rate and regular rhythm.     Pulses: Normal pulses.  Pulmonary:     Effort: Pulmonary effort is normal.  Chest:     Chest wall: No tenderness.  Abdominal:     General: There is no distension.     Palpations: Abdomen is soft.     Tenderness: There is abdominal tenderness.     Comments: Diffuse tenderness of the upper abdomen without guarding or rebound tenderness.  No CVA tenderness.  Abdomen is soft.  Musculoskeletal:     Right lower leg: No edema.     Left lower leg: No edema.  Skin:    General: Skin is warm.     Capillary Refill: Capillary refill takes less than 2 seconds.     Findings: No erythema or rash.  Neurological:     General: No focal deficit present.     Mental Status: She is alert.     Sensory: No sensory deficit.  Motor: No weakness.    ED Results / Procedures / Treatments   Labs (all labs ordered are listed, but only abnormal results are displayed) Labs Reviewed  COMPREHENSIVE METABOLIC PANEL - Abnormal; Notable for the following components:      Result Value   Sodium 134 (*)    CO2 21 (*)    Calcium 10.9 (*)    All other components within normal limits  CBC WITH DIFFERENTIAL/PLATELET - Abnormal; Notable for the following components:   MCV 101.2 (*)    All other components within normal limits  URINALYSIS, ROUTINE W REFLEX MICROSCOPIC - Abnormal; Notable for the following components:   Color, Urine AMBER (*)    APPearance HAZY (*)     Ketones, ur 5 (*)    Protein, ur 30 (*)    Bacteria, UA RARE (*)    All other components within normal limits  URINE CULTURE  LIPASE, BLOOD    EKG None  Radiology No results found.  Procedures Procedures   Medications Ordered in ED Medications  HYDROmorphone (DILAUDID) injection 1 mg (1 mg Intramuscular Given 05/02/21 1523)  ondansetron (ZOFRAN-ODT) disintegrating tablet 8 mg (8 mg Oral Given 05/02/21 1523)  HYDROmorphone (DILAUDID) injection 1 mg (1 mg Intramuscular Given 05/02/21 1803)    ED Course  I have reviewed the triage vital signs and the nursing notes.  Pertinent labs & imaging results that were available during my care of the patient were reviewed by me and considered in my medical decision making (see chart for details).    MDM Rules/Calculators/A&P                           Patient here with likely acute on chronic pancreatitis.  History of same.  She was seen here nearly 2 weeks ago with similar symptoms.  Reports temporary relief after her previous ER visit but then pain returned.  She endorses intermittent vomiting.  Abdominal pain has been diffuse but mostly upper abdomen.  Here requesting pain control until she can follow-up with her PCP.  On exam, patient is well-appearing nontoxic.  Afebrile.  No active vomiting.  She has some diffuse tenderness of the upper abdomen without guarding or rebound.  Low clinical suspicion for acute abdomen.  Will obtain labs.  Had CT of the abdomen pelvis in June that showed resolving pancreatitis without abscess  On recheck, patient reports feeling better.  Abdominal pain and nausea have improved.  She is tolerating oral fluids without difficulty.  States that she is feeling better and ready for discharge home.  States that she does have a PCP appointment on Monday.  On review of medical records, patient does have gastroenterology as well.  Labs today without evidence of leukocytosis.  No electrolyte derangement.  Lipase  unremarkable, improved from previous visit.  Urinalysis shows some mild proteinuria similar to baseline.  6-10 white cells with rare bacteria.  Urine culture is pending.  Patient denies any dysuria symptoms. I feel that she is appropriate for discharge home, she will follow-up with PCP on Monday she has pain medication at home as well.    Final Clinical Impression(s) / ED Diagnoses Final diagnoses:  Epigastric pain    Rx / DC Orders ED Discharge Orders     None        Bufford Lope 05/02/21 2339    Noemi Chapel, MD 05/04/21 1752

## 2021-05-02 NOTE — ED Provider Notes (Signed)
Emergency Medicine Provider Triage Evaluation Note  Colleen Lee , a 65 y.o. female  was evaluated in triage.  Pt complains of abdominal pain, exacerbation of chronic pancreatitis.  Review of Systems  Positive: Abdominal pain Negative: fever  Physical Exam  BP (!) 122/95 (BP Location: Right Arm)   Pulse (!) 109   Temp 98.4 F (36.9 C) (Oral)   Resp 18   Ht 5\' 6"  (1.676 m)   Wt 76.7 kg   SpO2 98%   BMI 27.28 kg/m  Gen:   Awake, no distress   Resp:  Normal effort  MSK:   Moves extremities without difficulty  Other:    Medical Decision Making  Medically screening exam initiated at 2:46 PM.  Appropriate orders placed.  Colleen Lee was informed that the remainder of the evaluation will be completed by another provider, this initial triage assessment does not replace that evaluation, and the importance of remaining in the ED until their evaluation is complete.     Colleen Hidden, MD 05/02/21 [phone]

## 2021-05-02 NOTE — ED Triage Notes (Signed)
Patient c/o generalized abd pain. Per patient seen here last week for pancreatitis and was treated felt better at discharge but pain started shortly after. Patient reports nausea, vomiting, and diarrhea. Denies any fevers or urinary symptoms. Denies any alcohol consumption.

## 2021-05-02 NOTE — Discharge Instructions (Addendum)
Please follow-up with your primary care provider on Monday.  Clear fluids tonight, bland diet as tolerated.  Also, call your GI provider next week to arrange a follow-up appointment.  Return to the emergency department for any new or worsening symptoms.

## 2021-05-04 DIAGNOSIS — F17210 Nicotine dependence, cigarettes, uncomplicated: Secondary | ICD-10-CM | POA: Diagnosis not present

## 2021-05-04 DIAGNOSIS — M5090 Cervical disc disorder, unspecified, unspecified cervical region: Secondary | ICD-10-CM | POA: Diagnosis not present

## 2021-05-04 DIAGNOSIS — R030 Elevated blood-pressure reading, without diagnosis of hypertension: Secondary | ICD-10-CM | POA: Diagnosis not present

## 2021-05-04 DIAGNOSIS — Z79899 Other long term (current) drug therapy: Secondary | ICD-10-CM | POA: Diagnosis not present

## 2021-05-04 DIAGNOSIS — G894 Chronic pain syndrome: Secondary | ICD-10-CM | POA: Diagnosis not present

## 2021-05-04 DIAGNOSIS — F17200 Nicotine dependence, unspecified, uncomplicated: Secondary | ICD-10-CM | POA: Diagnosis not present

## 2021-05-04 LAB — URINE CULTURE

## 2021-05-06 ENCOUNTER — Encounter: Payer: Self-pay | Admitting: Internal Medicine

## 2021-05-08 ENCOUNTER — Other Ambulatory Visit: Payer: Self-pay | Admitting: Internal Medicine

## 2021-05-08 DIAGNOSIS — Z87891 Personal history of nicotine dependence: Secondary | ICD-10-CM

## 2021-05-11 ENCOUNTER — Emergency Department (HOSPITAL_COMMUNITY): Payer: Medicare Other

## 2021-05-11 ENCOUNTER — Encounter (HOSPITAL_COMMUNITY): Payer: Self-pay | Admitting: *Deleted

## 2021-05-11 ENCOUNTER — Other Ambulatory Visit: Payer: Self-pay

## 2021-05-11 ENCOUNTER — Emergency Department (HOSPITAL_COMMUNITY)
Admission: EM | Admit: 2021-05-11 | Discharge: 2021-05-12 | Disposition: A | Discharge status: Home / Self Care | Payer: Medicare Other | Attending: Emergency Medicine | Admitting: Emergency Medicine

## 2021-05-11 DIAGNOSIS — Z79899 Other long term (current) drug therapy: Secondary | ICD-10-CM | POA: Diagnosis not present

## 2021-05-11 DIAGNOSIS — J45909 Unspecified asthma, uncomplicated: Secondary | ICD-10-CM | POA: Insufficient documentation

## 2021-05-11 DIAGNOSIS — R1010 Upper abdominal pain, unspecified: Secondary | ICD-10-CM | POA: Diagnosis not present

## 2021-05-11 DIAGNOSIS — F17210 Nicotine dependence, cigarettes, uncomplicated: Secondary | ICD-10-CM | POA: Diagnosis not present

## 2021-05-11 DIAGNOSIS — Z96652 Presence of left artificial knee joint: Secondary | ICD-10-CM | POA: Diagnosis not present

## 2021-05-11 DIAGNOSIS — I10 Essential (primary) hypertension: Secondary | ICD-10-CM | POA: Insufficient documentation

## 2021-05-11 DIAGNOSIS — R1084 Generalized abdominal pain: Secondary | ICD-10-CM | POA: Diagnosis present

## 2021-05-11 DIAGNOSIS — D259 Leiomyoma of uterus, unspecified: Secondary | ICD-10-CM | POA: Diagnosis not present

## 2021-05-11 DIAGNOSIS — K859 Acute pancreatitis without necrosis or infection, unspecified: Secondary | ICD-10-CM | POA: Diagnosis not present

## 2021-05-11 DIAGNOSIS — I700 Atherosclerosis of aorta: Secondary | ICD-10-CM | POA: Diagnosis not present

## 2021-05-11 LAB — CBC WITH DIFFERENTIAL/PLATELET
Basophils Absolute: 0 10*3/uL (ref 0.0–0.1)
Basophils Relative: 0 %
Eosinophils Absolute: 0 10*3/uL (ref 0.0–0.5)
Eosinophils Relative: 0 %
HCT: 33.2 % — ABNORMAL LOW (ref 36.0–46.0)
Hemoglobin: 11.2 g/dL — ABNORMAL LOW (ref 12.0–15.0)
Lymphocytes Relative: 6 %
Lymphs Abs: 0.3 10*3/uL — ABNORMAL LOW (ref 0.7–4.0)
MCH: 33.1 pg (ref 26.0–34.0)
MCHC: 33.7 g/dL (ref 30.0–36.0)
MCV: 98.2 fL (ref 80.0–100.0)
Monocytes Absolute: 0.3 10*3/uL (ref 0.1–1.0)
Monocytes Relative: 5 %
Neutro Abs: 4.8 10*3/uL (ref 1.7–7.7)
Neutrophils Relative %: 89 %
Platelets: 200 10*3/uL (ref 150–400)
RBC: 3.38 MIL/uL — ABNORMAL LOW (ref 3.87–5.11)
RDW: 14 % (ref 11.5–15.5)
WBC: 5.4 10*3/uL (ref 4.0–10.5)
nRBC: 0 % (ref 0.0–0.2)

## 2021-05-11 LAB — COMPREHENSIVE METABOLIC PANEL
ALT: 27 U/L (ref 0–44)
AST: 31 U/L (ref 15–41)
Albumin: 3.3 g/dL — ABNORMAL LOW (ref 3.5–5.0)
Alkaline Phosphatase: 68 U/L (ref 38–126)
Anion gap: 7 (ref 5–15)
BUN: 7 mg/dL — ABNORMAL LOW (ref 8–23)
CO2: 23 mmol/L (ref 22–32)
Calcium: 10.2 mg/dL (ref 8.9–10.3)
Chloride: 106 mmol/L (ref 98–111)
Creatinine, Ser: 0.66 mg/dL (ref 0.44–1.00)
GFR, Estimated: >60 mL/min (ref >60)
Glucose, Bld: 91 mg/dL (ref 70–99)
Potassium: 3.4 mmol/L — ABNORMAL LOW (ref 3.5–5.1)
Sodium: 136 mmol/L (ref 135–145)
Total Bilirubin: 0.8 mg/dL (ref 0.3–1.2)
Total Protein: 6.9 g/dL (ref 6.5–8.1)

## 2021-05-11 LAB — LIPASE, BLOOD: Lipase: 48 U/L (ref 11–51)

## 2021-05-11 MED ORDER — IOHEXOL 350 MG/ML SOLN
80.0000 mL | Freq: Once | INTRAVENOUS | Status: AC | PRN
  Administered 2021-05-11: 80 mL via INTRAVENOUS

## 2021-05-11 MED ORDER — HYDROMORPHONE HCL 1 MG/ML IJ SOLN
0.5000 mg | Freq: Once | INTRAMUSCULAR | Status: AC
  Administered 2021-05-11: 0.5 mg via INTRAVENOUS
  Filled 2021-05-11: qty 1

## 2021-05-11 MED ORDER — HYDROMORPHONE HCL 4 MG PO TABS: 4.0000 mg | ORAL_TABLET | Freq: Four times a day (QID) | ORAL | 0 refills | Status: DC | PRN

## 2021-05-11 MED ORDER — ONDANSETRON 4 MG PO TBDP: ORAL_TABLET | ORAL | 0 refills | Status: DC

## 2021-05-11 MED ORDER — HYDROMORPHONE HCL 1 MG/ML IJ SOLN
0.5000 mg | Freq: Once | INTRAMUSCULAR | Status: AC
Start: 2021-05-11 — End: 2021-05-11
  Administered 2021-05-11: 0.5 mg via INTRAVENOUS
  Filled 2021-05-11: qty 1

## 2021-05-11 MED ORDER — ONDANSETRON HCL 4 MG/2ML IJ SOLN
4.0000 mg | Freq: Once | INTRAMUSCULAR | Status: AC
  Administered 2021-05-11: 4 mg via INTRAVENOUS
  Filled 2021-05-11: qty 2

## 2021-05-11 MED ORDER — OXYCODONE-ACETAMINOPHEN 5-325 MG PO TABS: 1.0000 | ORAL_TABLET | Freq: Four times a day (QID) | ORAL | 0 refills | Status: DC | PRN

## 2021-05-11 MED ORDER — SODIUM CHLORIDE 0.9 % IV BOLUS
500.0000 mL | Freq: Once | INTRAVENOUS | Status: AC
  Administered 2021-05-11: 500 mL via INTRAVENOUS

## 2021-05-11 NOTE — ED Triage Notes (Signed)
Abdominal pain onset yesterday

## 2021-05-11 NOTE — ED Provider Notes (Signed)
[facility] EMERGENCY DEPARTMENT Provider Note   CSN: [id number] Arrival date & time: 05/11/21  1714     History Chief Complaint  Patient presents with   Abdominal Pain    Colleen Lee is a 65 y.o. female.  Patient complains of some abdominal discomfort.  Patient has a history of pancreatitis.  he threw up 1 time.  The history is provided by the patient and medical records. No language interpreter was used.  Abdominal Pain Pain location:  Generalized Pain quality: aching   Pain radiates to:  Does not radiate Pain severity:  Moderate Onset quality:  Sudden Timing:  Constant Progression:  Worsening Chronicity:  Recurrent Context: not alcohol use   Associated symptoms: no chest pain, no cough, no diarrhea, no fatigue and no hematuria       Past Medical History:  Diagnosis Date   Asthma    Chronic abdominal pain    Chronic back pain    Chronic constipation    Cirrhosis (HCC)    Metavir score F4 on elastography 3825   Cyclical vomiting    Fibroids    Fibromyalgia    GERD (gastroesophageal reflux disease)    H. pylori infection 2014   treated with pylera, had to be treated again as it was not eradicated. Urea breath test then negative after subsequent treatment.    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/24/2018   Shoulder impingement, right 05/24/2018   Dehydration    Intractable cyclical vomiting 05/39/7673   Pyloric stenosis in adult     Thrombocytopenia ([place]) 12/20/2017   Leukocytosis    Malnutrition of moderate degree 12/17/2017   Acute renal failure (ARF) ([place]) 12/15/2017   Chronic abdominal pain 12/15/2017   Gastroenteritis 06/14/2016   Nausea with vomiting 06/10/2016   Uterine enlargement 06/09/2016   Intractable nausea and vomiting 06/08/2016   Acute infective gastroenteritis 06/08/2016   Diarrhea 06/08/2016   Essential hypertension 06/08/2016   GERD (gastroesophageal reflux disease) 06/08/2016   Abdominal pain    Endometrial polyp 04/15/2016   PMB (postmenopausal bleeding) 41/93/7902   Alcoholic cirrhosis of liver without ascites ([place])    Asthma 01/21/2016   Hepatic cirrhosis ([place]) 09/22/2015   Arthritis of knee, degenerative 40/97/3532   History of Helicobacter pylori infection 10/30/2014   Chronic [name] with cirrhosis ([place]) 04/11/2014   De Quervain's disease (radial styloid tenosynovitis) 12/25/2013   Anorexia 11/21/2012   FH: colon cancer 11/21/2012   Early satiety 10/25/2012   Bowel habit changes 10/25/2012   Abdominal pain, epigastric 10/25/2012   Abdominal bloating 10/25/2012   Constipation 10/25/2012   Abnormal weight loss 10/25/2012   Chronic viral [name] ([place]) 10/25/2012   Radicular pain of left lower extremity 09/28/2012   Back pain 09/28/2012   Sciatica 08/10/2011   S/P arthroscopy of left knee 08/10/2011   Tibial plateau fracture 08/10/2011   Pain in  joint, lower leg 02/12/2011   Stiffness of joint, not elsewhere classified, lower leg 02/12/2011   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  Long redundant colon, a diminutive sigmoid polyp status post cold biopsy removed. Hyperplastic polyp. Repeat colonoscopy June 2014 due to family history of colon cancer   COLONOSCOPY WITH ESOPHAGOGASTRODUODENOSCOPY (EGD) N/A 11/02/2012   QGX:BLIUTPFT gastric mucosa of doubtful, +H.pylori. Incomplete colonoscopy due to patient unable to tolerate exam, proximal colon seen. Patient refused ACBE.   COLONOSCOPY WITH PROPOFOL N/A 03/08/2013   Dr. Jena Gauss: colonic polyp-removed as scribed above. Internal Hemorrhoids. Pathology did not reveal any colonic tissue, only mucus. SURVEILLANCE DUE Aug 2019   COLONOSCOPY WITH PROPOFOL N/A 01/19/2018   Dr. Jena Gauss: 6 mm cecal inflammatoy polyp, otherwise normal., Surveilance in 5 year s   ESOPHAGEAL DILATION  12/20/2017   Procedure: ESOPHAGEAL DILATION;  Surgeon: West Bali, MD;  Location: [facility];  Service: Endoscopy;;   ESOPHAGOGASTRODUODENOSCOPY  01/18/2008   RMR: Normal esophagus, normal  stomach   ESOPHAGOGASTRODUODENOSCOPY (EGD) WITH PROPOFOL N/A 02/09/2016   Normal esophagus, small hiatal hernia, portal hypertensive gastropathy, normal second portion of duodenum. Due in July 2019   ESOPHAGOGASTRODUODENOSCOPY (EGD) WITH PROPOFOL N/A 12/20/2017   Dr. Darrick Penna: benign appearing esophageal stenosis s/p dilation, mild pyloric stenosis s/p biopsy and dilation, mild duodenitis.    ESOPHAGOGASTRODUODENOSCOPY (EGD) WITH PROPOFOL N/A 06/12/2020   Rourk: Normal esophagus, portal hypertensive gastropathy, no specimens collected.  Tentatively plan for repeat EGD in 2 years.   HYSTEROSCOPY  05/11/2016   Procedure: HYSTEROSCOPY;  Surgeon: Tilda Burrow, MD;  Location: [facility] ORS;  Service: Gynecology;;   KNEE SURGERY     left knee   MULTIPLE EXTRACTIONS WITH ALVEOLOPLASTY N/A 10/15/2013   Procedure: MULTIPLE EXTRACTION WITH ALVEOLOPLASTY;  Surgeon: Georgia Lopes, DDS;   Location: [facility] OR;  Service: Oral Surgery;  Laterality: N/A;   POLYPECTOMY N/A 03/08/2013   Procedure: POLYPECTOMY;  Surgeon: Corbin Ade, MD;  Location: [facility];  Service: Endoscopy;  Laterality: N/A;   POLYPECTOMY N/A 05/11/2016   Procedure: REMOVAL OF ENDOMETRIAL POLYP;  Surgeon: Tilda Burrow, MD;  Location: [facility] OR[facility];  Service: Gynecology;  Laterality: N/A;   RESECTION DISTAL CLAVICAL Right 01/30/2019   Procedure: RESECTION DISTAL CLAVICAL;  Surgeon: Vickki Hearing, MD;  Location: [facility];  Service: Orthopedics;  Laterality: Right;   SHOULDER OPEN ROTATOR CUFF REPAIR Right 01/30/2019   Procedure: ROTATOR CUFF REPAIR SHOULDER OPEN;  Surgeon: Vickki Hearing, MD;  Location: [facility];  Service: Orthopedics;  Laterality: Right;  pt to arrive at 7:30 for PICC at 8:00   TOTAL KNEE ARTHROPLASTY Left 02/05/2015   Procedure: LEFT TOTAL KNEE ARTHROPLASTY;  Surgeon: Vickki Hearing, MD;  Location: [facility];  Service: Orthopedics;  Laterality: Left;     OB History     Gravida      Para      Term      Preterm      AB  Living  1      SAB      IAB      Ectopic      Multiple      Live Births              Family History  Problem Relation Age of Onset   Colon cancer Brother 24           Multiple myeloma Brother    Liver cancer Sister    Prostate cancer Brother    Pancreatic cancer Brother    Cancer Mother        breast   Asthma Mother     Social History   Tobacco Use   Smoking status: Every Day    Packs/day: 1.00    Years: 40.00    Pack years: 40.00    Types: Cigarettes   Smokeless tobacco: Never   Tobacco comments:    Smokes one pack of cigarettes every 1.5 days  Vaping Use   Vaping Use: Never used  Substance Use Topics   Alcohol use: Yes    Alcohol/week: 0.0 standard drinks    Comment: 1 bottle of dinner wine daily   Drug use: Not Currently    Types: Marijuana    Comment: history of marijuana in past- almost 2 years ago    Home  Medications Prior to Admission medications   Medication Sig Start Date End Date Taking? Authorizing Provider  HYDROmorphone (DILAUDID) 4 MG tablet Take 1 tablet (4 mg total) by mouth every 6 (six) hours as needed for severe pain. 05/11/21  Yes Milton Ferguson, MD  ondansetron (ZOFRAN ODT) 4 MG disintegrating tablet 4mg  ODT q4 hours prn nausea/vomit 05/11/21  Yes Milton Ferguson, MD  oxyCODONE-acetaminophen (PERCOCET/ROXICET) 5-325 MG tablet Take 1 tablet by mouth every 6 (six) hours as needed for severe pain. 05/11/21  Yes Milton Ferguson, MD  albuterol (PROVENTIL HFA;VENTOLIN HFA) 108 (90 Base) MCG/ACT inhaler Inhale 2 puffs into the lungs every 6 (six) hours as needed for wheezing or shortness of breath.     [provider]  allopurinol (ZYLOPRIM) 300 MG tablet Take 300 mg by mouth daily. 04/15/21   [provider]  cefdinir (OMNICEF) 300 MG capsule Take 1 capsule (300 mg total) by mouth 2 (two) times daily. Patient not taking: No sig reported 01/05/21   Barton Dubois, MD  Cyanocobalamin (B-12) 2500 MCG TABS Take 2,500 mcg by mouth daily.    [provider]  cyclobenzaprine (FLEXERIL) 5 MG tablet Take 5 mg by mouth 3 (three) times daily. 04/15/21   [provider]  folic acid (FOLVITE) 1 MG tablet Take 1 tablet (1 mg total) by mouth daily. 01/06/21   Barton Dubois, MD  gabapentin (NEURONTIN) 100 MG capsule Take 1 capsule (100 mg total) by mouth 3 (three) times daily. Patient taking differently: Take 100 mg by mouth 2 (two) times daily. Takes 1 tablet in the morning and 1 tablet in the evening 12/22/18   Carole Civil, MD  hydrALAZINE (APRESOLINE) 50 MG tablet Take 1 tablet (50 mg total) by mouth every 8 (eight) hours. 01/30/18   Barton Dubois, MD  HYDROcodone-acetaminophen (NORCO/VICODIN) 5-325 MG tablet Take 1 tablet by mouth 3 (three) times daily as needed for pain. 12/04/20   [provider]  hydrOXYzine (ATARAX/VISTARIL) 10 MG tablet Take 10 mg by mouth  daily.  11/30/17   [provider]  labetalol (NORMODYNE) 100 MG tablet Take 2 tablets (200 mg total) by mouth 2 (two) times  daily. Patient taking differently: Take 100 mg by mouth in the morning. 01/30/18   Barton Dubois, MD  megestrol (MEGACE) 40 MG tablet Take 40 mg by mouth daily. 04/02/21   [provider]  naloxone [facility]) nasal spray 4 mg/0.1 mL See admin instructions. ADMINISTER A SINGLE SPRAYEOF NARCAN INTO ONECNOSTRIL. REPEAT AFTER 3SMINUTES IN OTHER NOSTRIL IF NO RESPONSE. 10/03/20   [provider]  pantoprazole (PROTONIX) 40 MG tablet TAKE (1) TABLET BY MOUTH DAILY 30 MINUTES BEFORE BREAKFAST. Patient taking differently: Take 40 mg by mouth daily. 10/27/20   Erenest Rasher, PA-C  polyethylene glycol (MIRALAX) 17 g packet Take 17 g by mouth daily as needed for mild constipation. 01/05/21   Barton Dubois, MD  potassium chloride (MICRO-K) 10 MEQ CR capsule Take 20 mEq by mouth daily.    [provider]  thiamine 100 MG tablet Take 1 tablet (100 mg total) by mouth daily. 01/06/21   Barton Dubois, MD  TRULANCE 3 MG TABS Take 3 mg by mouth daily. 01/01/21   [provider]    Allergies    Penicillins  Review of Systems   Review of Systems  Constitutional:  Negative for appetite change and fatigue.  HENT:  Negative for congestion, ear discharge and sinus pressure.   Eyes:  Negative for discharge.  Respiratory:  Negative for cough.   Cardiovascular:  Negative for chest pain.  Gastrointestinal:  Positive for abdominal pain. Negative for diarrhea.  Genitourinary:  Negative for frequency and hematuria.  Musculoskeletal:  Negative for back pain.  Skin:  Negative for rash.  Neurological:  Negative for seizures and headaches.  Psychiatric/Behavioral:  Negative for hallucinations.    Physical Exam Updated Vital Signs BP (!) 144/100   Pulse 87   Temp 98.6 F (37 C)   Resp 18   SpO2 100%   Physical Exam Vitals and nursing note reviewed.   Constitutional:      Appearance: She is well-developed.  HENT:     Head: Normocephalic.     Nose: Nose normal.  Eyes:     General: No scleral icterus.    Conjunctiva/sclera: Conjunctivae normal.  Neck:     Thyroid: No thyromegaly.  Cardiovascular:     Rate and Rhythm: Normal rate and regular rhythm.     Heart sounds: No murmur heard.   No friction rub. No gallop.  Pulmonary:     Breath sounds: No stridor. No wheezing or rales.  Chest:     Chest wall: No tenderness.  Abdominal:     General: There is no distension.     Tenderness: There is abdominal tenderness. There is no rebound.  Musculoskeletal:        General: Normal range of motion.     Cervical back: Neck supple.  Lymphadenopathy:     Cervical: No cervical adenopathy.  Skin:    Findings: No erythema or rash.  Neurological:     Mental Status: She is alert and oriented to person, place, and time.     Motor: No abnormal muscle tone.     Coordination: Coordination normal.  Psychiatric:        Behavior: Behavior normal.    ED Results / Procedures / Treatments   Labs (all labs ordered are listed, but only abnormal results are displayed) Labs Reviewed  CBC WITH DIFFERENTIAL/PLATELET - Abnormal; Notable for the following components:      Result Value   RBC 3.38 (*)    Hemoglobin 11.2 (*)    HCT  33.2 (*)    Lymphs Abs 0.3 (*)    All other components within normal limits  COMPREHENSIVE METABOLIC PANEL - Abnormal; Notable for the following components:   Potassium 3.4 (*)    BUN 7 (*)    Albumin 3.3 (*)    All other components within normal limits  LIPASE, BLOOD    EKG None  Radiology CT ABDOMEN PELVIS W CONTRAST  Result Date: 05/11/2021 CLINICAL DATA:  Abdominal abscess or infection suspected EXAM: CT ABDOMEN AND PELVIS WITH CONTRAST TECHNIQUE: Multidetector CT imaging of the abdomen and pelvis was performed using the standard protocol following bolus administration of intravenous contrast. CONTRAST:  11mL  OMNIPAQUE IOHEXOL 350 MG/ML SOLN COMPARISON:  None. FINDINGS: LOWER CHEST: Normal. HEPATOBILIARY: Normal hepatic contours. No intra- or extrahepatic biliary dilatation. The gallbladder is normal. PANCREAS: There is mild inflammatory stranding surrounding the pancreatic body and tail. No peripancreatic fluid collection. SPLEEN: Normal. ADRENALS/URINARY TRACT: The adrenal glands are normal. No hydronephrosis, nephroureterolithiasis or solid renal mass. The urinary bladder is normal for degree of distention STOMACH/BOWEL: There is no hiatal hernia. Normal duodenal course and caliber. No small bowel dilatation or inflammation. No focal colonic abnormality. Normal appendix. VASCULAR/LYMPHATIC: There is calcific atherosclerosis of the abdominal aorta. No lymphadenopathy. REPRODUCTIVE: Calcified uterine fibroids. MUSCULOSKELETAL. No bony spinal canal stenosis or focal osseous abnormality. OTHER: None. IMPRESSION: Mild inflammatory stranding surrounding the pancreatic body and tail, consistent with acute pancreatitis. No peripancreatic fluid collection. Aortic Atherosclerosis (ICD10-I70.0). Electronically Signed   By: Ulyses Jarred M.D.   On: 05/11/2021 21:15    Procedures Procedures   Medications Ordered in ED Medications  sodium chloride 0.9 % bolus 500 mL (500 mLs Intravenous New Bag/Given 05/11/21 2000)  HYDROmorphone (DILAUDID) injection 0.5 mg (0.5 mg Intravenous Given 05/11/21 2036)  ondansetron (ZOFRAN) injection 4 mg (4 mg Intravenous Given 05/11/21 2035)  iohexol (OMNIPAQUE) 350 MG/ML injection 80 mL (80 mLs Intravenous Contrast Given 05/11/21 2058)  HYDROmorphone (DILAUDID) injection 0.5 mg (0.5 mg Intravenous Given 05/11/21 2122)    ED Course  I have reviewed the triage vital signs and the nursing notes.  Pertinent labs & imaging results that were available during my care of the patient were reviewed by me and considered in my medical decision making (see chart for details).    MDM  Rules/Calculators/A&P                           Patient with mild pancreatitis.  Patient had 2 doses of pain medicine and feels better and wants to be discharged home.  She will be given a prepack of Percocet because we do not have prepacks of anything else and she will be given a prescription of Dilaudid pills.  She will take liquids only and see her GI doctor next couple days Final Clinical Impression(s) / ED Diagnoses Final diagnoses:  Pain of upper abdomen    Rx / DC Orders ED Discharge Orders          Ordered    oxyCODONE-acetaminophen (PERCOCET/ROXICET) 5-325 MG tablet  Every 6 hours PRN        05/11/21 2141    ondansetron (ZOFRAN ODT) 4 MG disintegrating tablet        05/11/21 2143    HYDROmorphone (DILAUDID) 4 MG tablet  Every 6 hours PRN        05/11/21 2143             Milton Ferguson, MD 05/19/21 1712

## 2021-05-11 NOTE — Discharge Instructions (Addendum)
Take liquids only for the next 2 days.  Follow-up with your doctor in 2 to 3 days for recheck.  Return if any problems

## 2021-05-13 MED FILL — Oxycodone w/ Acetaminophen Tab 5-325 MG: ORAL | Qty: 6 | Status: AC

## 2021-05-17 ENCOUNTER — Other Ambulatory Visit: Payer: Self-pay

## 2021-05-17 ENCOUNTER — Encounter (HOSPITAL_COMMUNITY): Payer: Self-pay | Admitting: *Deleted

## 2021-05-17 ENCOUNTER — Inpatient Hospital Stay (HOSPITAL_COMMUNITY)
Admission: EM | Admit: 2021-05-17 | Discharge: 2021-05-22 | DRG: 440 | Disposition: A | Discharge status: Home / Self Care | Payer: Medicare Other | Attending: Internal Medicine | Admitting: Internal Medicine

## 2021-05-17 DIAGNOSIS — Z807 Family history of other malignant neoplasms of lymphoid, hematopoietic and related tissues: Secondary | ICD-10-CM | POA: Diagnosis not present

## 2021-05-17 DIAGNOSIS — J45909 Unspecified asthma, uncomplicated: Secondary | ICD-10-CM | POA: Diagnosis not present

## 2021-05-17 DIAGNOSIS — Z96652 Presence of left artificial knee joint: Secondary | ICD-10-CM | POA: Diagnosis not present

## 2021-05-17 DIAGNOSIS — K219 Gastro-esophageal reflux disease without esophagitis: Secondary | ICD-10-CM | POA: Diagnosis present

## 2021-05-17 DIAGNOSIS — IMO0002 Reserved for concepts with insufficient information to code with codable children: Secondary | ICD-10-CM | POA: Diagnosis present

## 2021-05-17 DIAGNOSIS — B1920 Unspecified viral hepatitis C without hepatic coma: Secondary | ICD-10-CM | POA: Diagnosis present

## 2021-05-17 DIAGNOSIS — K7460 Unspecified cirrhosis of liver: Secondary | ICD-10-CM | POA: Diagnosis present

## 2021-05-17 DIAGNOSIS — Z20822 Contact with and (suspected) exposure to covid-19: Secondary | ICD-10-CM | POA: Diagnosis not present

## 2021-05-17 DIAGNOSIS — Z880 Allergy status to penicillin: Secondary | ICD-10-CM

## 2021-05-17 DIAGNOSIS — R1013 Epigastric pain: Secondary | ICD-10-CM | POA: Diagnosis not present

## 2021-05-17 DIAGNOSIS — F1010 Alcohol abuse, uncomplicated: Secondary | ICD-10-CM | POA: Diagnosis present

## 2021-05-17 DIAGNOSIS — I10 Essential (primary) hypertension: Secondary | ICD-10-CM | POA: Diagnosis present

## 2021-05-17 DIAGNOSIS — R262 Difficulty in walking, not elsewhere classified: Secondary | ICD-10-CM | POA: Diagnosis present

## 2021-05-17 DIAGNOSIS — K859 Acute pancreatitis without necrosis or infection, unspecified: Secondary | ICD-10-CM | POA: Diagnosis not present

## 2021-05-17 DIAGNOSIS — R1012 Left upper quadrant pain: Secondary | ICD-10-CM | POA: Diagnosis not present

## 2021-05-17 DIAGNOSIS — K852 Alcohol induced acute pancreatitis without necrosis or infection: Principal | ICD-10-CM | POA: Diagnosis present

## 2021-05-17 DIAGNOSIS — F17210 Nicotine dependence, cigarettes, uncomplicated: Secondary | ICD-10-CM | POA: Diagnosis not present

## 2021-05-17 DIAGNOSIS — D638 Anemia in other chronic diseases classified elsewhere: Secondary | ICD-10-CM | POA: Diagnosis present

## 2021-05-17 DIAGNOSIS — M797 Fibromyalgia: Secondary | ICD-10-CM | POA: Diagnosis not present

## 2021-05-17 DIAGNOSIS — E876 Hypokalemia: Secondary | ICD-10-CM | POA: Diagnosis present

## 2021-05-17 DIAGNOSIS — R1011 Right upper quadrant pain: Secondary | ICD-10-CM | POA: Diagnosis not present

## 2021-05-17 DIAGNOSIS — Z79899 Other long term (current) drug therapy: Secondary | ICD-10-CM

## 2021-05-17 DIAGNOSIS — R109 Unspecified abdominal pain: Secondary | ICD-10-CM

## 2021-05-17 DIAGNOSIS — F419 Anxiety disorder, unspecified: Secondary | ICD-10-CM | POA: Diagnosis present

## 2021-05-17 DIAGNOSIS — Z825 Family history of asthma and other chronic lower respiratory diseases: Secondary | ICD-10-CM

## 2021-05-17 LAB — COMPREHENSIVE METABOLIC PANEL
ALT: 27 U/L (ref 0–44)
AST: 29 U/L (ref 15–41)
Albumin: 3.7 g/dL (ref 3.5–5.0)
Alkaline Phosphatase: 66 U/L (ref 38–126)
Anion gap: 7 (ref 5–15)
BUN: 9 mg/dL (ref 8–23)
CO2: 23 mmol/L (ref 22–32)
Calcium: 11 mg/dL — ABNORMAL HIGH (ref 8.9–10.3)
Chloride: 105 mmol/L (ref 98–111)
Creatinine, Ser: 0.7 mg/dL (ref 0.44–1.00)
GFR, Estimated: >60 mL/min (ref >60)
Glucose, Bld: 103 mg/dL — ABNORMAL HIGH (ref 70–99)
Potassium: 3.6 mmol/L (ref 3.5–5.1)
Sodium: 135 mmol/L (ref 135–145)
Total Bilirubin: 0.9 mg/dL (ref 0.3–1.2)
Total Protein: 7.5 g/dL (ref 6.5–8.1)

## 2021-05-17 LAB — CBC WITH DIFFERENTIAL/PLATELET
Abs Immature Granulocytes: 0.02 10*3/uL (ref 0.00–0.07)
Basophils Absolute: 0 10*3/uL (ref 0.0–0.1)
Basophils Relative: 0 %
Eosinophils Absolute: 0 10*3/uL (ref 0.0–0.5)
Eosinophils Relative: 1 %
HCT: 37.8 % (ref 36.0–46.0)
Hemoglobin: 12.8 g/dL (ref 12.0–15.0)
Immature Granulocytes: 0 %
Lymphocytes Relative: 21 %
Lymphs Abs: 1.1 10*3/uL (ref 0.7–4.0)
MCH: 32.6 pg (ref 26.0–34.0)
MCHC: 33.9 g/dL (ref 30.0–36.0)
MCV: 96.2 fL (ref 80.0–100.0)
Monocytes Absolute: 0.4 10*3/uL (ref 0.1–1.0)
Monocytes Relative: 7 %
Neutro Abs: 3.6 10*3/uL (ref 1.7–7.7)
Neutrophils Relative %: 71 %
Platelets: 231 10*3/uL (ref 150–400)
RBC: 3.93 MIL/uL (ref 3.87–5.11)
RDW: 13.8 % (ref 11.5–15.5)
WBC: 5.1 10*3/uL (ref 4.0–10.5)
nRBC: 0 % (ref 0.0–0.2)

## 2021-05-17 LAB — LIPASE, BLOOD: Lipase: 42 U/L (ref 11–51)

## 2021-05-17 MED ORDER — HYDROMORPHONE HCL 1 MG/ML IJ SOLN
0.5000 mg | Freq: Once | INTRAMUSCULAR | Status: AC
  Administered 2021-05-17: 0.5 mg via INTRAVENOUS
  Filled 2021-05-17: qty 1

## 2021-05-17 MED ORDER — MORPHINE SULFATE (PF) 4 MG/ML IV SOLN
4.0000 mg | Freq: Once | INTRAVENOUS | Status: AC
  Administered 2021-05-17: 4 mg via INTRAVENOUS
  Filled 2021-05-17: qty 1

## 2021-05-17 MED ORDER — SODIUM CHLORIDE 0.9 % IV BOLUS
1000.0000 mL | Freq: Once | INTRAVENOUS | Status: AC
  Administered 2021-05-17: 1000 mL via INTRAVENOUS

## 2021-05-17 MED ORDER — ONDANSETRON HCL 4 MG/2ML IJ SOLN
4.0000 mg | Freq: Once | INTRAMUSCULAR | Status: AC
  Administered 2021-05-17: 4 mg via INTRAVENOUS
  Filled 2021-05-17: qty 2

## 2021-05-17 NOTE — ED Notes (Signed)
Pt refuses to be discharged, requests to speak with provider. PA Harris notified. Pt notified that provider is currently busy with other pts at this time.

## 2021-05-17 NOTE — ED Triage Notes (Signed)
Recurrent abdominal pain 

## 2021-05-17 NOTE — ED Notes (Addendum)
Pt. Has requested that only Legrand Como can insert their IV and draw work.

## 2021-05-17 NOTE — ED Notes (Signed)
Pt. Wanted an update to the status of their IV. Pt. Told that Legrand Como is in an emergency and would be over as soon as they could.

## 2021-05-17 NOTE — Discharge Instructions (Addendum)
Get help right away if: You cannot eat or keep fluids down. Your pain becomes severe. Your skin or the white part of your eyes turns yellow (jaundice). You have sudden swelling in your abdomen. You vomit. You feel dizzy or you faint. Your blood sugar is high (over 300 mg/dL).

## 2021-05-17 NOTE — ED Provider Notes (Signed)
[facility] EMERGENCY DEPARTMENT Provider Note   CSN: [id number] Arrival date & time: 05/17/21  1149     History Chief Complaint  Patient presents with   Abdominal Pain    Colleen Lee is a 65 y.o. female who presents emergency department chief complaint of abdominal pain.  Patient has a history of recurrent episodes of pancreatitis, alcohol misuse disorder, liver cirrhosis.  Patient states that she was here just a few days ago with severe abdominal pain, this was improved with treatment and is now returned.  She admits to continually using alcohol.  She states that also she has numbness and tingling in her legs is having difficulty walking and has to get help to ambulate which has been new over the past several weeks.  She denies fevers, chills or diarrhea.  She has had nausea and vomiting.  She denies any urinary symptoms  Abdominal Pain     Past Medical History:  Diagnosis Date   Asthma    Chronic abdominal pain    Chronic back pain    Chronic constipation    Cirrhosis (HCC)    Metavir score F4 on elastography 1517   Cyclical vomiting    Fibroids    Fibromyalgia    GERD (gastroesophageal reflux disease)    H. pylori infection 2014   treated with pylera, had to be treated again as it was not eradicated. Urea breath test then negative after subsequent treatment.    [name]    HCV RNA positive 09/2012   Hypertension    Marijuana use    Nausea and vomiting    chronic, recurrent   PONV (postoperative nausea and vomiting)    pt thinks maybe once she had N&V   Sciatica of left side     Patient Active Problem List   Diagnosis Date Noted   Pancreatitis, recurrent 05/17/2021   Dysuria 09/16/2020   Body mass index (BMI) 31.0-31.9, adult 01/07/2020   Osteoarthritis of spine with radiculopathy, 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   Acute pancreatitis 10/17/2019   ETOH abuse 10/17/2019   S/P right rotator cuff repair 01/30/19 02/06/2019   Nontraumatic complete tear of right  rotator cuff    Arthritis of right acromioclavicular joint    S/P total knee replacement, 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/24/2018   Shoulder impingement, right 05/24/2018   Dehydration    Intractable cyclical vomiting 61/60/7371   Pyloric stenosis in adult    Thrombocytopenia ([place]) 12/20/2017   Leukocytosis    Malnutrition of moderate degree 12/17/2017   Acute renal failure (ARF) ([place]) 12/15/2017   Chronic abdominal pain 12/15/2017   Gastroenteritis 06/14/2016   Nausea with vomiting 06/10/2016   Uterine enlargement 06/09/2016   Intractable nausea and vomiting 06/08/2016   Acute infective gastroenteritis 06/08/2016   Diarrhea 06/08/2016   Essential hypertension 06/08/2016   GERD (gastroesophageal reflux disease) 06/08/2016   Abdominal pain    Endometrial polyp 04/15/2016   PMB (postmenopausal bleeding) 01/26/9484   Alcoholic cirrhosis of liver without ascites ([place])    Asthma 01/21/2016   Hepatic cirrhosis ([place]) 09/22/2015   Arthritis of knee, degenerative 46/27/0350   History of Helicobacter pylori infection 10/30/2014   Lumbago with sciatica 07/01/2014   Chronic [name] with cirrhosis ([place]) 04/11/2014   De Quervain's disease (radial styloid tenosynovitis) 12/25/2013   Anorexia 11/21/2012   FH: colon cancer 11/21/2012   Early satiety 10/25/2012   Bowel habit changes 10/25/2012   Abdominal pain, epigastric 10/25/2012   Abdominal bloating 10/25/2012   Constipation 10/25/2012  Abnormal weight loss 10/25/2012   Chronic viral [name] ([place]) 10/25/2012   Radicular pain of left lower extremity 09/28/2012   Back pain 09/28/2012   Sciatica 08/10/2011   S/P arthroscopy of left knee 08/10/2011   Tibial plateau fracture 08/10/2011   Pain in joint, lower leg 02/12/2011   Stiffness of joint, not elsewhere classified, lower leg 02/12/2011   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  Long redundant colon, a diminutive sigmoid polyp status post cold biopsy removed. Hyperplastic polyp. Repeat colonoscopy June 2014 due to family history of colon cancer   COLONOSCOPY WITH ESOPHAGOGASTRODUODENOSCOPY (EGD) N/A 11/02/2012   YQM:VHQIONGE gastric mucosa of doubtful, +H.pylori. Incomplete colonoscopy due to patient unable to tolerate exam, proximal colon seen. Patient refused ACBE.   COLONOSCOPY WITH PROPOFOL N/A 03/08/2013   Dr. Gala Romney: colonic polyp-removed as scribed above. Internal Hemorrhoids. Pathology did not reveal any colonic tissue, only mucus. SURVEILLANCE DUE Aug 2019   COLONOSCOPY WITH PROPOFOL N/A 01/19/2018   Dr. Gala Romney: 6 mm cecal inflammatoy polyp, otherwise normal., Surveilance in 5 year s   ESOPHAGEAL DILATION  12/20/2017   Procedure: ESOPHAGEAL DILATION;  Surgeon: Danie Binder, MD;  [facility]: [facility];  Service: Endoscopy;;   ESOPHAGOGASTRODUODENOSCOPY  01/18/2008   RMR: Normal esophagus, normal  stomach   ESOPHAGOGASTRODUODENOSCOPY (EGD) WITH PROPOFOL N/A 02/09/2016   Normal esophagus, small hiatal hernia, portal hypertensive gastropathy, normal second portion of duodenum. Due in July 2019   ESOPHAGOGASTRODUODENOSCOPY (EGD) WITH PROPOFOL N/A 12/20/2017   Dr. Oneida Alar: benign appearing esophageal stenosis s/p dilation, mild pyloric stenosis s/p biopsy and dilation, mild duodenitis.    ESOPHAGOGASTRODUODENOSCOPY (EGD) WITH PROPOFOL N/A 06/12/2020   Rourk: Normal esophagus, portal hypertensive gastropathy, no specimens collected.  Tentatively plan for repeat EGD in 2 years.    HYSTEROSCOPY  05/11/2016   Procedure: HYSTEROSCOPY;  Surgeon: Jonnie Kind, MD;  Location: [facility];  Service: Gynecology;;   KNEE SURGERY     left knee   MULTIPLE EXTRACTIONS WITH ALVEOLOPLASTY N/A 10/15/2013   Procedure: MULTIPLE EXTRACTION WITH ALVEOLOPLASTY;  Surgeon: Gae Bon, DDS;  Location: [facility];  Service: Oral Surgery;  Laterality: N/A;   POLYPECTOMY N/A 03/08/2013   Procedure: POLYPECTOMY;  Surgeon: Daneil Dolin, MD;  Location: [facility];  Service: Endoscopy;  Laterality: N/A;   POLYPECTOMY N/A 05/11/2016   Procedure: REMOVAL OF ENDOMETRIAL POLYP;  Surgeon: Jonnie Kind, MD;  Location: [facility];  Service: Gynecology;  Laterality: N/A;   RESECTION DISTAL CLAVICAL Right 01/30/2019   Procedure: RESECTION DISTAL CLAVICAL;  Surgeon: Carole Civil, MD;  Location: [facility];  Service: Orthopedics;  Laterality: Right;   SHOULDER OPEN ROTATOR CUFF REPAIR Right 01/30/2019   Procedure: ROTATOR CUFF REPAIR SHOULDER OPEN;  Surgeon: Carole Civil, MD;  Location: [facility];  Service: Orthopedics;  Laterality: Right;  pt to arrive at 7:30 for PICC at 8:00   [place] Left 02/05/2015   Procedure: LEFT TOTAL KNEE ARTHROPLASTY;  Surgeon: Tim Lair  Harrison, MD;  Location: [facility];  Service: Orthopedics;  Laterality: Left;     OB History     Gravida      Para      Term      Preterm      AB      Living  1      SAB      IAB      Ectopic      Multiple      Live Births              Family History  Problem Relation Age of Onset   Colon cancer Brother 54           Multiple myeloma Brother    Liver cancer Sister    Prostate cancer Brother    Pancreatic cancer Brother    Cancer Mother        breast   Asthma Mother     Social History   Tobacco Use   Smoking status: Every Day    Packs/day: 1.00    Years: 40.00    Pack years: 40.00    Types: Cigarettes   Smokeless tobacco: Never   Tobacco comments:    Smokes one pack of cigarettes every 1.5 days   Vaping Use   Vaping Use: Never used  Substance Use Topics   Alcohol use: Yes    Alcohol/week: 0.0 standard drinks    Comment: 1 bottle of dinner wine daily   Drug use: Not Currently    Types: Marijuana    Comment: history of marijuana in past- almost 2 years ago    Home Medications Prior to Admission medications   Medication Sig Start Date End Date Taking? Authorizing Provider  albuterol (PROVENTIL HFA;VENTOLIN HFA) 108 (90 Base) MCG/ACT inhaler Inhale 2 puffs into the lungs every 6 (six) hours as needed for wheezing or shortness of breath.    Yes [provider]  allopurinol (ZYLOPRIM) 300 MG tablet Take 300 mg by mouth daily. 04/15/21  Yes [provider]  cyclobenzaprine (FLEXERIL) 5 MG tablet Take 5 mg by mouth 3 (three) times daily. 04/15/21  Yes [provider]  folic acid (FOLVITE) 1 MG tablet Take 1 tablet (1 mg total) by mouth daily. 01/06/21  Yes Madera, Carlos, MD  gabapentin (NEURONTIN) 100 MG capsule Take 1 capsule (100 mg total) by mouth 3 (three) times daily. Patient taking differently: Take 100 mg by mouth 2 (two) times daily. Takes 1 tablet in the morning and 1 tablet in the evening 12/22/18  Yes Harrison, Stanley E, MD  hydrALAZINE (APRESOLINE) 50 MG tablet Take 1 tablet (50 mg total) by mouth every 8 (eight) hours. 01/30/18  Yes Madera, Carlos, MD  HYDROcodone-acetaminophen (NORCO/VICODIN) 5-325 MG tablet Take 1 tablet by mouth 3 (three) times daily as needed for pain. 12/04/20  Yes [provider]  HYDROmorphone (DILAUDID) 4 MG tablet Take 1 tablet (4 mg total) by mouth every 6 (six) hours as needed for severe pain. 05/11/21  Yes Zammit, Joseph, MD  hydrOXYzine (ATARAX/VISTARIL) 10 MG tablet Take 10 mg by mouth daily.  11/30/17  Yes [provider]  labetalol (NORMODYNE) 100 MG tablet Take 2 tablets (200 mg total) by mouth 2 (two) times daily. Patient taking differently: Take 100 mg by mouth in the morning. 01/30/18  Yes Madera, Carlos,  MD  megestrol (MEGACE) 40 MG tablet Take 40 mg by mouth daily. 04/02/21  Yes [provider]  naloxone (NARCAN) nasal spray 4   mg/0.1 mL See admin instructions. ADMINISTER A SINGLE SPRAYEOF NARCAN INTO ONECNOSTRIL. REPEAT AFTER 3SMINUTES IN OTHER NOSTRIL IF NO RESPONSE. 10/03/20  Yes [provider]  ondansetron (ZOFRAN ODT) 4 MG disintegrating tablet 15m ODT q4 hours prn nausea/vomit 05/11/21  Yes ZMilton Ferguson MD  pantoprazole (PROTONIX) 40 MG tablet TAKE (1) TABLET BY MOUTH DAILY 30 MINUTES BEFORE BREAKFAST. Patient taking differently: Take 40 mg by mouth daily. 10/27/20  Yes HAliene AltesS, PA-C  polyethylene glycol (MIRALAX) 17 g packet Take 17 g by mouth daily as needed for mild constipation. 01/05/21  Yes MBarton Dubois MD  potassium chloride (MICRO-K) 10 MEQ CR capsule Take 20 mEq by mouth daily.   Yes [provider]  cefdinir (OMNICEF) 300 MG capsule Take 1 capsule (300 mg total) by mouth 2 (two) times daily. Patient not taking: No sig reported 01/05/21   MBarton Dubois MD  oxyCODONE-acetaminophen (PERCOCET/ROXICET) 5-325 MG tablet Take 1 tablet by mouth every 6 (six) hours as needed for severe pain. Patient not taking: No sig reported 05/11/21   ZMilton Ferguson MD  thiamine 100 MG tablet Take 1 tablet (100 mg total) by mouth daily. Patient not taking: Reported on 05/18/2021 01/06/21   MBarton Dubois MD  TRULANCE 3 MG TABS Take 3 mg by mouth daily. Patient not taking: Reported on 05/18/2021 01/01/21   [provider]    Allergies    Penicillins  Review of Systems   Review of Systems  Gastrointestinal:  Positive for abdominal pain.  Ten systems reviewed and are negative for acute change, except as noted in the HPI.   Physical Exam Updated Vital Signs BP (!) 159/92   Pulse 88   Temp 98.1 F (36.7 C) (Oral)   Resp (!) 21   SpO2 100%   Physical Exam Vitals and nursing note reviewed.  Constitutional:      General: She is not in acute  distress.    Appearance: She is well-developed. She is not diaphoretic.  HENT:     Head: Normocephalic and atraumatic.     Right Ear: External ear normal.     Left Ear: External ear normal.     Nose: Nose normal.     Mouth/Throat:     Mouth: Mucous membranes are moist.  Eyes:     General: No scleral icterus.    Conjunctiva/sclera: Conjunctivae normal.  Cardiovascular:     Rate and Rhythm: Normal rate and regular rhythm.     Heart sounds: Normal heart sounds. No murmur heard.   No friction rub. No gallop.  Pulmonary:     Effort: Pulmonary effort is normal. No respiratory distress.     Breath sounds: Normal breath sounds.  Abdominal:     General: Bowel sounds are normal. There is distension.     Palpations: Abdomen is soft. There is fluid wave. There is no mass.     Tenderness: There is abdominal tenderness in the right upper quadrant, epigastric area and left upper quadrant. There is guarding.  Musculoskeletal:     Cervical back: Normal range of motion.  Skin:    General: Skin is warm and dry.  Neurological:     Mental Status: She is alert and oriented to person, place, and time.  Psychiatric:        Behavior: Behavior normal.    ED Results / Procedures / Treatments   Labs (all labs ordered are listed, but only abnormal results are displayed) Labs Reviewed  COMPREHENSIVE METABOLIC PANEL - Abnormal; Notable for  the following components:      Result Value   Glucose, Bld 103 (*)    Calcium 11.0 (*)    All other components within normal limits  COMPREHENSIVE METABOLIC PANEL - Abnormal; Notable for the following components:   Sodium 134 (*)    Potassium 3.0 (*)    BUN 7 (*)    Total Protein 6.1 (*)    Albumin 3.0 (*)    Anion gap 4 (*)    All other components within normal limits  MAGNESIUM - Abnormal; Notable for the following components:   Magnesium 1.6 (*)    All other components within normal limits  CBC - Abnormal; Notable for the following components:   RBC 3.20  (*)    Hemoglobin 10.7 (*)    HCT 31.6 (*)    All other components within normal limits  RESP PANEL BY RT-PCR (FLU A&B, COVID) ARPGX2  CBC WITH DIFFERENTIAL/PLATELET  LIPASE, BLOOD    EKG None  Radiology No results found.  Procedures Procedures   Medications Ordered in ED Medications  hydrALAZINE (APRESOLINE) tablet 50 mg (50 mg Oral Given 05/18/21 0603)  labetalol (NORMODYNE) tablet 100 mg (100 mg Oral Given 05/18/21 1121)  hydrOXYzine (ATARAX/VISTARIL) tablet 10 mg (10 mg Oral Given 05/18/21 1128)  cyclobenzaprine (FLEXERIL) tablet 5 mg (5 mg Oral Given 05/18/21 1122)  gabapentin (NEURONTIN) capsule 100 mg (100 mg Oral Given 05/18/21 1123)  LORazepam (ATIVAN) tablet 1-4 mg (has no administration in time range)    Or  LORazepam (ATIVAN) injection 1-4 mg (has no administration in time range)  thiamine tablet 100 mg (100 mg Oral Given 05/18/21 1122)    Or  thiamine (B-1) injection 100 mg ( Intravenous See Alternative 76/54/65 0354)  folic acid (FOLVITE) tablet 1 mg (1 mg Oral Given 05/18/21 1121)  multivitamin with minerals tablet 1 tablet (1 tablet Oral Given 05/18/21 1121)  0.9 %  sodium chloride infusion ( Intravenous New Bag/Given 05/18/21 0049)  LORazepam (ATIVAN) injection 0-4 mg (0 mg Intravenous Not Given 05/18/21 1112)    Followed by  LORazepam (ATIVAN) injection 0-4 mg (has no administration in time range)  acetaminophen (TYLENOL) tablet 650 mg (has no administration in time range)    Or  acetaminophen (TYLENOL) suppository 650 mg (has no administration in time range)  oxyCODONE (Oxy IR/ROXICODONE) immediate release tablet 5 mg (5 mg Oral Given 05/18/21 0040)  ondansetron (ZOFRAN) tablet 4 mg (has no administration in time range)    Or  ondansetron (ZOFRAN) injection 4 mg (has no administration in time range)  pantoprazole (PROTONIX) injection 40 mg (40 mg Intravenous Given 05/18/21 0043)  enoxaparin (LOVENOX) injection 40 mg (has no administration in time range)   morphine 2 MG/ML injection 2 mg (2 mg Intravenous Given 05/18/21 1124)  potassium chloride 10 mEq in 100 mL IVPB (10 mEq Intravenous New Bag/Given 05/18/21 1105)  morphine 4 MG/ML injection 4 mg (4 mg Intravenous Given 05/17/21 1853)  ondansetron (ZOFRAN) injection 4 mg (4 mg Intravenous Given 05/17/21 1853)  HYDROmorphone (DILAUDID) injection 0.5 mg (0.5 mg Intravenous Given 05/17/21 2003)  sodium chloride 0.9 % bolus 1,000 mL (0 mLs Intravenous Stopped 05/17/21 2118)  magnesium sulfate IVPB 2 g 50 mL (0 g Intravenous Stopped 05/18/21 6568)    ED Course  I have reviewed the triage vital signs and the nursing notes.  Pertinent labs & imaging results that were available during my care of the patient were reviewed by me and considered in my medical decision making (  see chart for details).    MDM Rules/Calculators/A&P                           65 y/o F well known to this ED with ETOH misuse disorder and chronic abdominal pain/ pancreatitis. She presents with abd pain and vomiting. I ordered and reviewed a cbc, cmp, and lipase without sig abnormality.   6 days ago CT shows evidence of acute pancreatitis without elevated lipase. I suspect this is what is recurring with the patient today. I have low suspicion for SBP, ascities, perforated viscus. Patient continues to have severe pain and has refused discharge with oral pain meds. Will admit for pain control at this time. PDMP not reviewed this encounter.  Final Clinical Impression(s) / ED Diagnoses Final diagnoses:  Recurrent abdominal pain    Rx / DC Orders ED Discharge Orders     None        Margarita Mail, PA-C 05/18/21 1155    Sherwood Gambler, MD 05/20/21 1456

## 2021-05-17 NOTE — ED Notes (Signed)
Pt placed on cardiac monitor with BP to set cycle every 30 minutes. Continuous pulse oximeter applied.  

## 2021-05-17 NOTE — ED Notes (Signed)
Pt tolerated drinking 1/3 cup of sprite

## 2021-05-18 DIAGNOSIS — K859 Acute pancreatitis without necrosis or infection, unspecified: Secondary | ICD-10-CM

## 2021-05-18 DIAGNOSIS — E876 Hypokalemia: Secondary | ICD-10-CM | POA: Diagnosis present

## 2021-05-18 DIAGNOSIS — K219 Gastro-esophageal reflux disease without esophagitis: Secondary | ICD-10-CM | POA: Diagnosis present

## 2021-05-18 DIAGNOSIS — F1010 Alcohol abuse, uncomplicated: Secondary | ICD-10-CM

## 2021-05-18 DIAGNOSIS — I10 Essential (primary) hypertension: Secondary | ICD-10-CM | POA: Diagnosis not present

## 2021-05-18 DIAGNOSIS — D638 Anemia in other chronic diseases classified elsewhere: Secondary | ICD-10-CM | POA: Diagnosis present

## 2021-05-18 DIAGNOSIS — Z96652 Presence of left artificial knee joint: Secondary | ICD-10-CM | POA: Diagnosis present

## 2021-05-18 DIAGNOSIS — B1920 Unspecified viral hepatitis C without hepatic coma: Secondary | ICD-10-CM | POA: Diagnosis present

## 2021-05-18 DIAGNOSIS — Z880 Allergy status to penicillin: Secondary | ICD-10-CM | POA: Diagnosis not present

## 2021-05-18 DIAGNOSIS — F419 Anxiety disorder, unspecified: Secondary | ICD-10-CM | POA: Diagnosis present

## 2021-05-18 DIAGNOSIS — R188 Other ascites: Secondary | ICD-10-CM | POA: Diagnosis not present

## 2021-05-18 DIAGNOSIS — Z79899 Other long term (current) drug therapy: Secondary | ICD-10-CM | POA: Diagnosis not present

## 2021-05-18 DIAGNOSIS — K852 Alcohol induced acute pancreatitis without necrosis or infection: Secondary | ICD-10-CM | POA: Diagnosis present

## 2021-05-18 DIAGNOSIS — F17210 Nicotine dependence, cigarettes, uncomplicated: Secondary | ICD-10-CM | POA: Diagnosis present

## 2021-05-18 DIAGNOSIS — J45909 Unspecified asthma, uncomplicated: Secondary | ICD-10-CM | POA: Diagnosis present

## 2021-05-18 DIAGNOSIS — K7460 Unspecified cirrhosis of liver: Secondary | ICD-10-CM | POA: Diagnosis present

## 2021-05-18 DIAGNOSIS — Z825 Family history of asthma and other chronic lower respiratory diseases: Secondary | ICD-10-CM | POA: Diagnosis not present

## 2021-05-18 DIAGNOSIS — M797 Fibromyalgia: Secondary | ICD-10-CM | POA: Diagnosis present

## 2021-05-18 DIAGNOSIS — Z807 Family history of other malignant neoplasms of lymphoid, hematopoietic and related tissues: Secondary | ICD-10-CM | POA: Diagnosis not present

## 2021-05-18 DIAGNOSIS — R262 Difficulty in walking, not elsewhere classified: Secondary | ICD-10-CM | POA: Diagnosis present

## 2021-05-18 DIAGNOSIS — Z20822 Contact with and (suspected) exposure to covid-19: Secondary | ICD-10-CM | POA: Diagnosis present

## 2021-05-18 LAB — COMPREHENSIVE METABOLIC PANEL
ALT: 22 U/L (ref 0–44)
AST: 22 U/L (ref 15–41)
Albumin: 3 g/dL — ABNORMAL LOW (ref 3.5–5.0)
Alkaline Phosphatase: 53 U/L (ref 38–126)
Anion gap: 4 — ABNORMAL LOW (ref 5–15)
BUN: 7 mg/dL — ABNORMAL LOW (ref 8–23)
CO2: 23 mmol/L (ref 22–32)
Calcium: 9.5 mg/dL (ref 8.9–10.3)
Chloride: 107 mmol/L (ref 98–111)
Creatinine, Ser: 0.54 mg/dL (ref 0.44–1.00)
GFR, Estimated: >60 mL/min (ref >60)
Glucose, Bld: 87 mg/dL (ref 70–99)
Potassium: 3 mmol/L — ABNORMAL LOW (ref 3.5–5.1)
Sodium: 134 mmol/L — ABNORMAL LOW (ref 135–145)
Total Bilirubin: 0.7 mg/dL (ref 0.3–1.2)
Total Protein: 6.1 g/dL — ABNORMAL LOW (ref 6.5–8.1)

## 2021-05-18 LAB — CBC
HCT: 31.6 % — ABNORMAL LOW (ref 36.0–46.0)
Hemoglobin: 10.7 g/dL — ABNORMAL LOW (ref 12.0–15.0)
MCH: 33.4 pg (ref 26.0–34.0)
MCHC: 33.9 g/dL (ref 30.0–36.0)
MCV: 98.8 fL (ref 80.0–100.0)
Platelets: 201 10*3/uL (ref 150–400)
RBC: 3.2 MIL/uL — ABNORMAL LOW (ref 3.87–5.11)
RDW: 14.1 % (ref 11.5–15.5)
WBC: 6.2 10*3/uL (ref 4.0–10.5)
nRBC: 0 % (ref 0.0–0.2)

## 2021-05-18 LAB — MAGNESIUM: Magnesium: 1.6 mg/dL — ABNORMAL LOW (ref 1.7–2.4)

## 2021-05-18 LAB — RESP PANEL BY RT-PCR (FLU A&B, COVID) ARPGX2
Influenza A by PCR: NEGATIVE
Influenza B by PCR: NEGATIVE
SARS Coronavirus 2 by RT PCR: NEGATIVE

## 2021-05-18 MED ORDER — LORAZEPAM 2 MG/ML IJ SOLN: 0.0000 mg | Freq: Two times a day (BID) | INTRAMUSCULAR | Status: AC

## 2021-05-18 MED ORDER — THIAMINE HCL 100 MG/ML IJ SOLN: 100.0000 mg | Freq: Every day | INTRAMUSCULAR | Status: DC

## 2021-05-18 MED ORDER — ONDANSETRON HCL 4 MG/2ML IJ SOLN: 4.0000 mg | Freq: Four times a day (QID) | INTRAMUSCULAR | Status: DC | PRN

## 2021-05-18 MED ORDER — OXYCODONE HCL 5 MG PO TABS
5.0000 mg | ORAL_TABLET | ORAL | Status: DC | PRN
  Administered 2021-05-18 – 2021-05-22 (×18): 5 mg via ORAL
  Filled 2021-05-18 (×18): qty 1

## 2021-05-18 MED ORDER — HEPARIN SODIUM (PORCINE) 5000 UNIT/ML IJ SOLN
5000.0000 [IU] | Freq: Three times a day (TID) | INTRAMUSCULAR | Status: DC
  Administered 2021-05-18: 5000 [IU] via SUBCUTANEOUS
  Filled 2021-05-18: qty 1

## 2021-05-18 MED ORDER — MAGNESIUM SULFATE 2 GM/50ML IV SOLN
2.0000 g | Freq: Once | INTRAVENOUS | Status: AC
  Administered 2021-05-18: 2 g via INTRAVENOUS
  Filled 2021-05-18: qty 50

## 2021-05-18 MED ORDER — MORPHINE SULFATE (PF) 2 MG/ML IV SOLN
2.0000 mg | INTRAVENOUS | Status: DC | PRN
  Administered 2021-05-18 – 2021-05-22 (×17): 2 mg via INTRAVENOUS
  Filled 2021-05-18 (×16): qty 1

## 2021-05-18 MED ORDER — HYDRALAZINE HCL 25 MG PO TABS
50.0000 mg | ORAL_TABLET | Freq: Three times a day (TID) | ORAL | Status: DC
  Administered 2021-05-18 – 2021-05-22 (×14): 50 mg via ORAL
  Filled 2021-05-18 (×14): qty 2

## 2021-05-18 MED ORDER — SODIUM CHLORIDE 0.9 % IV SOLN: INTRAVENOUS | Status: DC

## 2021-05-18 MED ORDER — LORAZEPAM 2 MG/ML IJ SOLN: 0.0000 mg | Freq: Four times a day (QID) | INTRAMUSCULAR | Status: AC

## 2021-05-18 MED ORDER — LORAZEPAM 2 MG/ML IJ SOLN: 1.0000 mg | INTRAMUSCULAR | Status: DC | PRN

## 2021-05-18 MED ORDER — ACETAMINOPHEN 325 MG PO TABS: 650.0000 mg | ORAL_TABLET | Freq: Four times a day (QID) | ORAL | Status: DC | PRN

## 2021-05-18 MED ORDER — PANTOPRAZOLE SODIUM 40 MG IV SOLR
40.0000 mg | Freq: Every day | INTRAVENOUS | Status: DC
  Administered 2021-05-18 – 2021-05-21 (×5): 40 mg via INTRAVENOUS
  Filled 2021-05-18 (×5): qty 40

## 2021-05-18 MED ORDER — FOLIC ACID 1 MG PO TABS
1.0000 mg | ORAL_TABLET | Freq: Every day | ORAL | Status: DC
  Administered 2021-05-18 – 2021-05-22 (×5): 1 mg via ORAL
  Filled 2021-05-18 (×5): qty 1

## 2021-05-18 MED ORDER — POTASSIUM CHLORIDE 10 MEQ/100ML IV SOLN
10.0000 meq | INTRAVENOUS | Status: AC
  Administered 2021-05-18 (×4): 10 meq via INTRAVENOUS
  Filled 2021-05-18 (×4): qty 100

## 2021-05-18 MED ORDER — ENOXAPARIN SODIUM 40 MG/0.4ML IJ SOSY
40.0000 mg | PREFILLED_SYRINGE | INTRAMUSCULAR | Status: DC
  Administered 2021-05-19 – 2021-05-22 (×4): 40 mg via SUBCUTANEOUS
  Filled 2021-05-18 (×4): qty 0.4

## 2021-05-18 MED ORDER — ACETAMINOPHEN 650 MG RE SUPP: 650.0000 mg | Freq: Four times a day (QID) | RECTAL | Status: DC | PRN

## 2021-05-18 MED ORDER — HYDROXYZINE HCL 10 MG PO TABS
10.0000 mg | ORAL_TABLET | Freq: Every day | ORAL | Status: DC
  Administered 2021-05-18 – 2021-05-22 (×5): 10 mg via ORAL
  Filled 2021-05-18 (×5): qty 1

## 2021-05-18 MED ORDER — LABETALOL HCL 200 MG PO TABS
100.0000 mg | ORAL_TABLET | Freq: Every morning | ORAL | Status: DC
  Administered 2021-05-18 – 2021-05-22 (×5): 100 mg via ORAL
  Filled 2021-05-18 (×4): qty 1

## 2021-05-18 MED ORDER — ADULT MULTIVITAMIN W/MINERALS CH
1.0000 | ORAL_TABLET | Freq: Every day | ORAL | Status: DC
  Administered 2021-05-18 – 2021-05-22 (×5): 1 via ORAL
  Filled 2021-05-18 (×5): qty 1

## 2021-05-18 MED ORDER — MORPHINE SULFATE (PF) 4 MG/ML IV SOLN
4.0000 mg | INTRAVENOUS | Status: DC | PRN
  Administered 2021-05-18: 4 mg via INTRAVENOUS
  Filled 2021-05-18 (×2): qty 1

## 2021-05-18 MED ORDER — CYCLOBENZAPRINE HCL 10 MG PO TABS
5.0000 mg | ORAL_TABLET | Freq: Three times a day (TID) | ORAL | Status: DC
  Administered 2021-05-18 – 2021-05-22 (×13): 5 mg via ORAL
  Filled 2021-05-18 (×13): qty 1

## 2021-05-18 MED ORDER — THIAMINE HCL 100 MG PO TABS
100.0000 mg | ORAL_TABLET | Freq: Every day | ORAL | Status: DC
  Administered 2021-05-18 – 2021-05-22 (×5): 100 mg via ORAL
  Filled 2021-05-18 (×5): qty 1

## 2021-05-18 MED ORDER — LORAZEPAM 1 MG PO TABS: 1.0000 mg | ORAL_TABLET | ORAL | Status: DC | PRN

## 2021-05-18 MED ORDER — GABAPENTIN 100 MG PO CAPS
100.0000 mg | ORAL_CAPSULE | Freq: Two times a day (BID) | ORAL | Status: DC
  Administered 2021-05-18 – 2021-05-22 (×10): 100 mg via ORAL
  Filled 2021-05-18 (×10): qty 1

## 2021-05-18 MED ORDER — ONDANSETRON HCL 4 MG PO TABS: 4.0000 mg | ORAL_TABLET | Freq: Four times a day (QID) | ORAL | Status: DC | PRN

## 2021-05-18 NOTE — ED Notes (Signed)
Walked to bathroom x1 assist

## 2021-05-18 NOTE — ED Notes (Signed)
Attempted report with Joellen Jersey

## 2021-05-18 NOTE — Progress Notes (Signed)
PROGRESS NOTE  Colleen Lee:[id number] DOB: [birth year] DOA: 05/17/2021 PCP: Sandi Mariscal, MD  HPI/Recap of past 24 hours: Colleen Lee  is a 65 y.o. female, with history of chronic abdominal pain, chronic pancreatitis, cirrhosis, [name], GERD, alcohol abuse, presents the ED with a chief complaint of worsening generalized abdominal pain, N/V X 3 days. Pt presented to the ED 05/11/21, diagnosed with acute pancreatitis, wanted to try to manage her pain at home, so was discharged. Patient reports that she has had subjective fever and chills. Patient currently smokes.  Patient drinks 1-2 bottles of wine per night.  She does not use illicit drugs.  Patient is vaccinated for COVID. In the ED afebrile, tachycardia, BP 162/100, satting at 96% on RA. Labs unremarkable, lipase 42. CT abdomen pelvis done 6 days ago showed acute pancreatitis. Pt admitted for further management.    Today, pt still with significant generalized abdominal pain, nausea but denies any vomiting, chest pain, SOB, fever/chills, diarrhea.  Assessment/Plan: Active Problems:   Essential hypertension   Acute pancreatitis   ETOH abuse   Pancreatitis, recurrent   Acute on chronic pancreatitis CT abd/pelvis showed above Hx of present alcohol abuse Lipase 42 IVF, pain management Advance to CLD as tolerated Monitor closely  Hypokalemia/Hypomagnesemia Replace prn  Hypertension Uncontrolled Continue home hydralazine as well as IV hydralazine and labetalol prn   Hx of cirrhosis  Stable on CT, LFTs stable as well  GERD Continue PPI  Alcohol abuse Advised to quit CIWA protocol, MVT, thiamine, FA  Anxiety Continue Atarax    Estimated body mass index is 27.28 kg/m as calculated from the following:   Height as of this encounter: 5\' 6"  (1.676 m).   Weight as of this encounter: 76.7 kg.     Code Status: Full  Family Communication: None at bedside  Disposition Plan: Status is: Inpatient  Remains  inpatient appropriate because: Level of care     Consultants: None  Procedures: None  Antimicrobials: None  DVT prophylaxis: Lovenox   Objective: Vitals:   05/18/21 1300 05/18/21 1320 05/18/21 1404 05/18/21 1433  BP: (!) 167/97  (!) 149/113 (!) 149/113  Pulse: 74  76 76  Resp: 16  18 18   Temp:  99 F (37.2 C) 97.6 F (36.4 C) 97.6 F (36.4 C)  TempSrc:  Oral Oral Oral  SpO2: 99%  100%   Weight:    76.7 kg  Height:    5\' 6"  (1.676 m)    Intake/Output Summary (Last 24 hours) at 05/18/2021 1553 Last data filed at 05/18/2021 1300 Gross per 24 hour  Intake 1041.8 ml  Output --  Net 1041.8 ml   Filed Weights   05/18/21 1433  Weight: 76.7 kg    Exam: General: NAD  Cardiovascular: S1, S2 present Respiratory: CTAB Abdomen: Soft, ++tender, nondistended, bowel sounds present Musculoskeletal: No bilateral pedal edema noted Skin: Normal Psychiatry: Normal mood      Data Reviewed: CBC: Recent Labs  Lab 05/11/21 1951 05/17/21 1840 05/18/21 0440  WBC 5.4 5.1 6.2  NEUTROABS 4.8 3.6  --   HGB 11.2* 12.8 10.7*  HCT 33.2* 37.8 31.6*  MCV 98.2 96.2 98.8  PLT 200 231 419   Basic Metabolic Panel: Recent Labs  Lab 05/11/21 1951 05/17/21 1840 05/18/21 0440  NA 136 135 134*  K 3.4* 3.6 3.0*  CL 106 105 107  CO2 23 23 23   GLUCOSE 91 103* 87  BUN 7* 9 7*  CREATININE 0.66 0.70 0.54  CALCIUM  10.2 11.0* 9.5  MG  --   --  1.6*   GFR: Estimated Creatinine Clearance: 73.4 mL/min (by C-G formula based on SCr of 0.54 mg/dL). Liver Function Tests: Recent Labs  Lab 05/11/21 1951 05/17/21 1840 05/18/21 0440  AST 31 29 22   ALT 27 27 22   ALKPHOS 68 66 53  BILITOT 0.8 0.9 0.7  PROT 6.9 7.5 6.1*  ALBUMIN 3.3* 3.7 3.0*   Recent Labs  Lab 05/11/21 1951 05/17/21 1840  LIPASE 48 42   No results for input(s): AMMONIA in the last 168 hours. Coagulation Profile: No results for input(s): INR, PROTIME in the last 168 hours. Cardiac Enzymes: No results for  input(s): CKTOTAL, CKMB, CKMBINDEX, TROPONINI in the last 168 hours. BNP (last 3 results) No results for input(s): PROBNP in the last 8760 hours. HbA1C: No results for input(s): HGBA1C in the last 72 hours. CBG: No results for input(s): GLUCAP in the last 168 hours. Lipid Profile: No results for input(s): CHOL, HDL, LDLCALC, TRIG, CHOLHDL, LDLDIRECT in the last 72 hours. Thyroid Function Tests: No results for input(s): TSH, T4TOTAL, FREET4, T3FREE, THYROIDAB in the last 72 hours. Anemia Panel: No results for input(s): VITAMINB12, FOLATE, FERRITIN, TIBC, IRON, RETICCTPCT in the last 72 hours. Urine analysis:    Component Value Date/Time   COLORURINE AMBER (A) 05/02/2021 1708   APPEARANCEUR HAZY (A) 05/02/2021 1708   LABSPEC 1.021 05/02/2021 1708   PHURINE 5.0 05/02/2021 1708   GLUCOSEU NEGATIVE 05/02/2021 1708   HGBUR NEGATIVE 05/02/2021 1708   BILIRUBINUR NEGATIVE 05/02/2021 1708   KETONESUR 5 (A) 05/02/2021 1708   PROTEINUR 30 (A) 05/02/2021 1708   UROBILINOGEN 0.2 04/01/2015 1140   NITRITE NEGATIVE 05/02/2021 1708   LEUKOCYTESUR NEGATIVE 05/02/2021 1708   Sepsis Labs: @LABRCNTIP (procalcitonin:4,lacticidven:4)  ) Recent Results (from the past 240 hour(s))  Resp Panel by RT-PCR (Flu A&B, Covid) Nasopharyngeal Swab     Status: None   Collection Time: 05/17/21 10:54 PM   Specimen: Nasopharyngeal Swab; Nasopharyngeal(NP) swabs in vial transport medium  Result Value Ref Range Status   SARS Coronavirus 2 by RT PCR NEGATIVE NEGATIVE Final    Comment: (NOTE) SARS-CoV-2 target nucleic acids are NOT DETECTED.  The SARS-CoV-2 RNA is generally detectable in upper respiratory specimens during the acute phase of infection. The lowest concentration of SARS-CoV-2 viral copies this assay can detect is 138 copies/mL. A negative result does not preclude SARS-Cov-2 infection and should not be used as the sole basis for treatment or other patient management decisions. A negative result may  occur with  improper specimen collection/handling, submission of specimen other than nasopharyngeal swab, presence of viral mutation(s) within the areas targeted by this assay, and inadequate number of viral copies(<138 copies/mL). A negative result must be combined with clinical observations, patient history, and epidemiological information. The expected result is Negative.  Fact Sheet for Patients:  EntrepreneurPulse.com.au  Fact Sheet for Healthcare Providers:  IncredibleEmployment.be  This test is no t yet approved or cleared by the [place] FDA and  has been authorized for detection and/or diagnosis of SARS-CoV-2 by FDA under an Emergency Use Authorization (EUA). This EUA will remain  in effect (meaning this test can be used) for the duration of the COVID-19 declaration under Section 564(b)(1) of the Act, 21 U.S.C.section 360bbb-3(b)(1), unless the authorization is terminated  or revoked sooner.       Influenza A by PCR NEGATIVE NEGATIVE Final   Influenza B by PCR NEGATIVE NEGATIVE Final    Comment: (NOTE) The  Xpert Xpress SARS-CoV-2/FLU/RSV plus assay is intended as an aid in the diagnosis of influenza from Nasopharyngeal swab specimens and should not be used as a sole basis for treatment. Nasal washings and aspirates are unacceptable for Xpert Xpress SARS-CoV-2/FLU/RSV testing.  Fact Sheet for Patients: EntrepreneurPulse.com.au  Fact Sheet for Healthcare Providers: IncredibleEmployment.be  This test is not yet approved or cleared by the [place] FDA and has been authorized for detection and/or diagnosis of SARS-CoV-2 by FDA under an Emergency Use Authorization (EUA). This EUA will remain in effect (meaning this test can be used) for the duration of the COVID-19 declaration under Section 564(b)(1) of the Act, 21 U.S.C. section 360bbb-3(b)(1), unless the authorization is terminated  or revoked.  Performed at [facility], [address] [place], [place] [address]       Studies: No results found.  Scheduled Meds:  cyclobenzaprine  5 mg Oral TID   [START ON 05/19/2021] enoxaparin (LOVENOX) injection  40 mg Subcutaneous G62I   folic acid  1 mg Oral Daily   gabapentin  100 mg Oral BID   hydrALAZINE  50 mg Oral Q8H   hydrOXYzine  10 mg Oral Daily   labetalol  100 mg Oral q AM   LORazepam  0-4 mg Intravenous Q6H   Followed by   Derrill Memo ON 05/20/2021] LORazepam  0-4 mg Intravenous Q12H   multivitamin with minerals  1 tablet Oral Daily   pantoprazole (PROTONIX) IV  40 mg Intravenous QHS   thiamine  100 mg Oral Daily   Or   thiamine  100 mg Intravenous Daily    Continuous Infusions:  sodium chloride 100 mL/hr at 05/18/21 0049     LOS: 0 days     Alma Friendly, MD [facility]  If 7PM-7AM, please contact night-coverage [email or web address] 05/18/2021, 3:53 PM

## 2021-05-18 NOTE — H&P (Signed)
TRH H&P    Patient Demographics:    Colleen Lee, is a 65 y.o. female  MRN: [id number]  DOB - Feb 03, 1956  Admit Date - 05/17/2021  Referring MD/NP/PA: Vernie Shanks  Outpatient Primary MD for the patient is Sandi Mariscal, MD  Patient coming from: Home  Chief complaint- abdominal pain   HPI:    Colleen Lee  is a 65 y.o. female, with history of chronic abdominal pain, chronic pancreatitis, cirrhosis, [name], GERD, nausea and vomiting, alcohol abuse, and more presents the ED with a chief complaint of abdominal pain.  Patient reports that the pain started about 3 days ago.  It feels like a tightness.  Its diffuse across her entire abdomen.  She presented to the ED 6 days ago and was given the option to admit or discharge home, she decided to try to manage her pain at home.  She reports that since then has become more sharp and tight over the last 3 days.  She has also noticed that her abdomen is more bloated.  Her appetite is decreased, her last normal meal was 4 days ago.  She has nausea and vomiting every time she tries to eat.  She describes the emesis as undigested food, without blood.  She is able to keep down Ensure.  Her last normal bowel movement was 2 days ago.  Patient reports that she has had fever and chills.  She had no dysuria, hematuria, urinary frequency.  She has had no skin rashes or sores.  She reports feeling like tightness, and reports that her PCP told her that this was due to gout.  Patient has no other complaints at this time.  Patient currently smokes.  Patient drinks 1-2 bottles of wine per night.  She does not use illicit drugs.  Patient is vaccinated for COVID.  In the ED  Temp 98.1, heart rate 72-113, respiratory rate 9-20, blood pressure 162/100, satting at 96% No leukocytosis at 5.1, hemoglobin 12.8, platelets 231 Chemistry is unremarkable Lipase is 42, this is down from 6 days ago 48 CT  abdomen pelvis done 6 days ago shows pancreatitis Patient was given Dilaudid, morphine, Zofran, 1 L normal saline Admission requested for management of pancreatitis    Review of systems:    In addition to the HPI above,  Admits to fever and chills No Headache, No changes with Vision or hearing, No problems swallowing food or Liquids, No Chest pain, Cough or Shortness of Breath, No Blood in stool or Urine, No dysuria, No new skin rashes or bruises, No new joints pains-aches,  No new weakness, tingling, numbness in any extremity, No recent weight gain or loss, No polyuria, polydypsia or polyphagia, No significant Mental Stressors.  All other systems reviewed and are negative.    Past History of the following :    Past Medical History:  Diagnosis Date   Asthma    Chronic abdominal pain    Chronic back pain    Chronic constipation    Cirrhosis ([place])    Metavir score F4 on elastography 2015  Cyclical vomiting    Fibroids    Fibromyalgia    GERD (gastroesophageal reflux disease)    H. pylori infection 2014   treated with pylera, had to be treated again as it was not eradicated. Urea breath test then negative after subsequent treatment.    [name]    HCV RNA positive 09/2012   Hypertension    Marijuana use    Nausea and vomiting    chronic, recurrent   PONV (postoperative nausea and vomiting)    pt thinks maybe once she had N&V   Sciatica of left side       Past Surgical History:  Procedure Laterality Date   BALLOON DILATION  12/20/2017   Procedure: BALLOON DILATION;  Surgeon: Danie Binder, MD;  [facility];  Service: Endoscopy;;  pyloric dilation   BIOPSY  12/20/2017   Procedure: BIOPSY;  Surgeon: Danie Binder, MD;  [facility];  Service: Endoscopy;;  duodenal and gastric biopsy   COLONOSCOPY  01/18/2008   JOA:CZYSAY rectum.  Long redundant colon, a diminutive sigmoid polyp status post cold biopsy removed. Hyperplastic polyp. Repeat  colonoscopy June 2014 due to family history of colon cancer   COLONOSCOPY WITH ESOPHAGOGASTRODUODENOSCOPY (EGD) N/A 11/02/2012   TKZ:SWFUXNAT gastric mucosa of doubtful, +H.pylori. Incomplete colonoscopy due to patient unable to tolerate exam, proximal colon seen. Patient refused ACBE.   COLONOSCOPY WITH PROPOFOL N/A 03/08/2013   Dr. Gala Romney: colonic polyp-removed as scribed above. Internal Hemorrhoids. Pathology did not reveal any colonic tissue, only mucus. SURVEILLANCE DUE Aug 2019   COLONOSCOPY WITH PROPOFOL N/A 01/19/2018   Dr. Gala Romney: 6 mm cecal inflammatoy polyp, otherwise normal., Surveilance in 5 year s   ESOPHAGEAL DILATION  12/20/2017   Procedure: ESOPHAGEAL DILATION;  Surgeon: Danie Binder, MD;  [facility];  Service: Endoscopy;;   ESOPHAGOGASTRODUODENOSCOPY  01/18/2008   RMR: Normal esophagus, normal  stomach   ESOPHAGOGASTRODUODENOSCOPY (EGD) WITH PROPOFOL N/A 02/09/2016   Normal esophagus, small hiatal hernia, portal hypertensive gastropathy, normal second portion of duodenum. Due in July 2019   ESOPHAGOGASTRODUODENOSCOPY (EGD) WITH PROPOFOL N/A 12/20/2017   Dr. Oneida Alar: benign appearing esophageal stenosis s/p dilation, mild pyloric stenosis s/p biopsy and dilation, mild duodenitis.    ESOPHAGOGASTRODUODENOSCOPY (EGD) WITH PROPOFOL N/A 06/12/2020   Rourk: Normal esophagus, portal hypertensive gastropathy, no specimens collected.  Tentatively plan for repeat EGD in 2 years.   HYSTEROSCOPY  05/11/2016   Procedure: HYSTEROSCOPY;  Surgeon: Jonnie Kind, MD;  Location: [facility];  Service: Gynecology;;   KNEE SURGERY     left knee   MULTIPLE EXTRACTIONS WITH ALVEOLOPLASTY N/A 10/15/2013   Procedure: MULTIPLE EXTRACTION WITH ALVEOLOPLASTY;  Surgeon: Gae Bon, DDS;  Location: [facility];  Service: Oral Surgery;  Laterality: N/A;   POLYPECTOMY N/A 03/08/2013   Procedure: POLYPECTOMY;  Surgeon: Daneil Dolin, MD;  Location[facility];  Service: Endoscopy;  Laterality: N/A;    POLYPECTOMY N/A 05/11/2016   Procedure: REMOVAL OF ENDOMETRIAL POLYP;  Surgeon: Jonnie Kind, MD;  Location: [facility];  Service: Gynecology;  Laterality: N/A;   RESECTION DISTAL CLAVICAL Right 01/30/2019   Procedure: RESECTION DISTAL CLAVICAL;  Surgeon: Carole Civil, MD;  Location: [facility];  Service: Orthopedics;  Laterality: Right;   SHOULDER OPEN ROTATOR CUFF REPAIR Right 01/30/2019   Procedure: ROTATOR CUFF REPAIR SHOULDER OPEN;  Surgeon: Carole Civil, MD;  Location: [facility];  Service: Orthopedics;  Laterality: Right;  pt to arrive at 7:30 for PICC  at 8:00   TOTAL KNEE ARTHROPLASTY Left 02/05/2015   Procedure: LEFT TOTAL KNEE ARTHROPLASTY;  Surgeon: Carole Civil, MD;  Location[facility];  Service: Orthopedics;  Laterality: Left;      Social History:      Social History   Tobacco Use   Smoking status: Every Day    Packs/day: 1.00    Years: 40.00    Pack years: 40.00    Types: Cigarettes   Smokeless tobacco: Never   Tobacco comments:    Smokes one pack of cigarettes every 1.5 days  Substance Use Topics   Alcohol use: Yes    Alcohol/week: 0.0 standard drinks    Comment: 1 bottle of dinner wine daily       Family History :     Family History  Problem Relation Age of Onset   Colon cancer Brother 67           Multiple myeloma Brother    Liver cancer Sister    Prostate cancer Brother    Pancreatic cancer Brother    Cancer Mother        breast   Asthma Mother       Home Medications:   Prior to Admission medications   Medication Sig Start Date End Date Taking? Authorizing Provider  albuterol (PROVENTIL HFA;VENTOLIN HFA) 108 (90 Base) MCG/ACT inhaler Inhale 2 puffs into the lungs every 6 (six) hours as needed for wheezing or shortness of breath.     [provider]  allopurinol (ZYLOPRIM) 300 MG tablet Take 300 mg by mouth daily. 04/15/21   [provider]  cefdinir (OMNICEF) 300 MG capsule Take 1 capsule (300 mg total) by mouth 2 (two)  times daily. Patient not taking: No sig reported 01/05/21   Barton Dubois, MD  Cyanocobalamin (B-12) 2500 MCG TABS Take 2,500 mcg by mouth daily.    [provider]  cyclobenzaprine (FLEXERIL) 5 MG tablet Take 5 mg by mouth 3 (three) times daily. 04/15/21   [provider]  folic acid (FOLVITE) 1 MG tablet Take 1 tablet (1 mg total) by mouth daily. 01/06/21   Barton Dubois, MD  gabapentin (NEURONTIN) 100 MG capsule Take 1 capsule (100 mg total) by mouth 3 (three) times daily. Patient taking differently: Take 100 mg by mouth 2 (two) times daily. Takes 1 tablet in the morning and 1 tablet in the evening 12/22/18   Carole Civil, MD  hydrALAZINE (APRESOLINE) 50 MG tablet Take 1 tablet (50 mg total) by mouth every 8 (eight) hours. 01/30/18   Barton Dubois, MD  HYDROcodone-acetaminophen (NORCO/VICODIN) 5-325 MG tablet Take 1 tablet by mouth 3 (three) times daily as needed for pain. 12/04/20   [provider]  HYDROmorphone (DILAUDID) 4 MG tablet Take 1 tablet (4 mg total) by mouth every 6 (six) hours as needed for severe pain. 05/11/21   Milton Ferguson, MD  hydrOXYzine (ATARAX/VISTARIL) 10 MG tablet Take 10 mg by mouth daily.  11/30/17   [provider]  labetalol (NORMODYNE) 100 MG tablet Take 2 tablets (200 mg total) by mouth 2 (two) times daily. Patient taking differently: Take 100 mg by mouth in the morning. 01/30/18   Barton Dubois, MD  megestrol (MEGACE) 40 MG tablet Take 40 mg by mouth daily. 04/02/21   [provider]  naloxone [facility]) nasal spray 4 mg/0.1 mL See admin instructions. ADMINISTER A SINGLE SPRAYEOF NARCAN INTO ONECNOSTRIL. REPEAT AFTER 3SMINUTES IN OTHER NOSTRIL IF NO RESPONSE. 10/03/20   [provider]  ondansetron (ZOFRAN ODT) 4 MG disintegrating tablet 4mg  ODT q4 hours prn nausea/vomit 05/11/21   Milton Ferguson, MD  oxyCODONE-acetaminophen (PERCOCET/ROXICET) 5-325 MG tablet Take 1 tablet by mouth every 6 (six) hours as needed for  severe pain. 05/11/21   Milton Ferguson, MD  pantoprazole (PROTONIX) 40 MG tablet TAKE (1) TABLET BY MOUTH DAILY 30 MINUTES BEFORE BREAKFAST. Patient taking differently: Take 40 mg by mouth daily. 10/27/20   Erenest Rasher, PA-C  polyethylene glycol (MIRALAX) 17 g packet Take 17 g by mouth daily as needed for mild constipation. 01/05/21   Barton Dubois, MD  potassium chloride (MICRO-K) 10 MEQ CR capsule Take 20 mEq by mouth daily.    [provider]  thiamine 100 MG tablet Take 1 tablet (100 mg total) by mouth daily. 01/06/21   Barton Dubois, MD  TRULANCE 3 MG TABS Take 3 mg by mouth daily. 01/01/21   [provider]     Allergies:     Allergies  Allergen Reactions   Penicillins Rash    Did it involve swelling of the face/tongue/throat, SOB, or low BP? No Did it involve sudden or severe rash/hives, skin peeling, or any reaction on the inside of your mouth or nose? No Did you need to seek medical attention at a hospital or doctor's office? No When did it last happen? Many years ago      If all above answers are "NO", may proceed with cephalosporin use.       Physical Exam:   Vitals  Blood pressure (!) 151/97, pulse 89, temperature 98.1 F (36.7 C), temperature source Oral, resp. rate 12, SpO2 100 %.  1.  General: Patient lying supine in bed,  no acute distress   2. Psychiatric: Alert and oriented x 3, mood and behavior normal for situation, pleasant and cooperative with exam   3. Neurologic: Speech and language are normal, face is symmetric, moves all 4 extremities voluntarily, at baseline without acute deficits on limited exam   4. HEENMT:  Head is atraumatic, normocephalic, pupils reactive to light, neck is supple, trachea is midline, mucous membranes are moist   5. Respiratory : Lungs are clear to auscultation bilaterally without wheezing, rhonchi, rales, no cyanosis, no increase in work of breathing or accessory muscle use   6. Cardiovascular : Heart  rate normal, rhythm is regular, no murmurs, rubs or gallops, no peripheral edema, peripheral pulses palpated   7. Gastrointestinal:  Abdomen is soft, mildly distended, diffusely tender, without guarding, no Murphy sign   8. Skin:  Skin is warm, dry and intact without rashes, acute lesions, or ulcers on limited exam   9.Musculoskeletal:  No acute deformities or trauma, no asymmetry in tone, no peripheral edema, peripheral pulses palpated, no tenderness to palpation in the extremities     Data Review:    CBC Recent Labs  Lab 05/11/21 1951 05/17/21 1840  WBC 5.4 5.1  HGB 11.2* 12.8  HCT 33.2* 37.8  PLT 200 231  MCV 98.2 96.2  MCH 33.1 32.6  MCHC 33.7 33.9  RDW 14.0 13.8  LYMPHSABS 0.3* 1.1  MONOABS 0.3 0.4  EOSABS 0.0 0.0  BASOSABS 0.0 0.0   ------------------------------------------------------------------------------------------------------------------  Results for orders placed or performed during the hospital encounter of 05/17/21 (from the past 48 hour(s))  Comprehensive metabolic panel     Status: Abnormal   Collection Time: 05/17/21  6:40 PM  Result Value Ref Range   Sodium 135 135 - 145 mmol/L   Potassium 3.6 3.5 -  5.1 mmol/L   Chloride 105 98 - 111 mmol/L   CO2 23 22 - 32 mmol/L   Glucose, Bld 103 (H) 70 - 99 mg/dL    Comment: Glucose reference range applies only to samples taken after fasting for at least 8 hours.   BUN 9 8 - 23 mg/dL   Creatinine, Ser 8.28 0.44 - 1.00 mg/dL   Calcium 33.2 (H) 8.9 - 10.3 mg/dL   Total Protein 7.5 6.5 - 8.1 g/dL   Albumin 3.7 3.5 - 5.0 g/dL   AST 29 15 - 41 U/L   ALT 27 0 - 44 U/L   Alkaline Phosphatase 66 38 - 126 U/L   Total Bilirubin 0.9 0.3 - 1.2 mg/dL   GFR, Estimated >33 >48 mL/min    Comment: (NOTE) Calculated using the CKD-EPI Creatinine Equation (2021)    Anion gap 7 5 - 15    Comment: Performed at [facility], [address], [place], [place] [address]  CBC with Differential     Status: None   Collection  Time: 05/17/21  6:40 PM  Result Value Ref Range   WBC 5.1 4.0 - 10.5 K/uL   RBC 3.93 3.87 - 5.11 MIL/uL   Hemoglobin 12.8 12.0 - 15.0 g/dL   HCT 78.6 54.5 - 61.3 %   MCV 96.2 80.0 - 100.0 fL   MCH 32.6 26.0 - 34.0 pg   MCHC 33.9 30.0 - 36.0 g/dL   RDW 27.3 53.0 - 29.5 %   Platelets 231 150 - 400 K/uL   nRBC 0.0 0.0 - 0.2 %   Neutrophils Relative % 71 %   Neutro Abs 3.6 1.7 - 7.7 K/uL   Lymphocytes Relative 21 %   Lymphs Abs 1.1 0.7 - 4.0 K/uL   Monocytes Relative 7 %   Monocytes Absolute 0.4 0.1 - 1.0 K/uL   Eosinophils Relative 1 %   Eosinophils Absolute 0.0 0.0 - 0.5 K/uL   Basophils Relative 0 %   Basophils Absolute 0.0 0.0 - 0.1 K/uL   Immature Granulocytes 0 %   Abs Immature Granulocytes 0.02 0.00 - 0.07 K/uL    Comment: Performed at [facility], [address] [place], [place] [address]  Lipase, blood     Status: None   Collection Time: 05/17/21  6:40 PM  Result Value Ref Range   Lipase 42 11 - 51 U/L    Comment: Performed at [facility], [address], [place], [place] [address]  Resp Panel by RT-PCR (Flu A&B, Covid) Nasopharyngeal Swab     Status: None   Collection Time: 05/17/21 10:54 PM   Specimen: Nasopharyngeal Swab; Nasopharyngeal(NP) swabs in vial transport medium  Result Value Ref Range   SARS Coronavirus 2 by RT PCR NEGATIVE NEGATIVE    Comment: (NOTE) SARS-CoV-2 target nucleic acids are NOT DETECTED.  The SARS-CoV-2 RNA is generally detectable in upper respiratory specimens during the acute phase of infection. The lowest concentration of SARS-CoV-2 viral copies this assay can detect is 138 copies/mL. A negative result does not preclude SARS-Cov-2 infection and should not be used as the sole basis for treatment or other patient management decisions. A negative result may occur with  improper specimen collection/handling, submission of specimen other than nasopharyngeal swab, presence of viral mutation(s) within the areas targeted by this assay, and  inadequate number of viral copies(<138 copies/mL). A negative result must be combined with clinical observations, patient history, and epidemiological information. The expected result is Negative.  Fact Sheet for Patients:  EntrepreneurPulse.com.au  Fact Sheet for Healthcare Providers:  IncredibleEmployment.be  This test is no t yet approved or cleared by the [place] FDA and  has been authorized for detection and/or diagnosis of SARS-CoV-2 by FDA under an Emergency Use Authorization (EUA). This EUA will remain  in effect (meaning this test can be used) for the duration of the COVID-19 declaration under Section 564(b)(1) of the Act, 21 U.S.C.section 360bbb-3(b)(1), unless the authorization is terminated  or revoked sooner.       Influenza A by PCR NEGATIVE NEGATIVE   Influenza B by PCR NEGATIVE NEGATIVE    Comment: (NOTE) The Xpert Xpress SARS-CoV-2/FLU/RSV plus assay is intended as an aid in the diagnosis of influenza from Nasopharyngeal swab specimens and should not be used as a sole basis for treatment. Nasal washings and aspirates are unacceptable for Xpert Xpress SARS-CoV-2/FLU/RSV testing.  Fact Sheet for Patients: EntrepreneurPulse.com.au  Fact Sheet for Healthcare Providers: IncredibleEmployment.be  This test is not yet approved or cleared by the [place] FDA and has been authorized for detection and/or diagnosis of SARS-CoV-2 by FDA under an Emergency Use Authorization (EUA). This EUA will remain in effect (meaning this test can be used) for the duration of the COVID-19 declaration under Section 564(b)(1) of the Act, 21 U.S.C. section 360bbb-3(b)(1), unless the authorization is terminated or revoked.  Performed at [facility], [address], [place], [place] [address]    *Note: Due to a large number of results and/or encounters for the requested time period, some results have not  been displayed. A complete set of results can be found in Results Review.    Chemistries  Recent Labs  Lab 05/11/21 1951 05/17/21 1840  NA 136 135  K 3.4* 3.6  CL 106 105  CO2 23 23  GLUCOSE 91 103*  BUN 7* 9  CREATININE 0.66 0.70  CALCIUM 10.2 11.0*  AST 31 29  ALT 27 27  ALKPHOS 68 66  BILITOT 0.8 0.9   ------------------------------------------------------------------------------------------------------------------  ------------------------------------------------------------------------------------------------------------------ GFR: CrCl cannot be calculated (Unknown ideal weight.). Liver Function Tests: Recent Labs  Lab 05/11/21 1951 05/17/21 1840  AST 31 29  ALT 27 27  ALKPHOS 68 66  BILITOT 0.8 0.9  PROT 6.9 7.5  ALBUMIN 3.3* 3.7   Recent Labs  Lab 05/11/21 1951 05/17/21 1840  LIPASE 48 42   No results for input(s): AMMONIA in the last 168 hours. Coagulation Profile: No results for input(s): INR, PROTIME in the last 168 hours. Cardiac Enzymes: No results for input(s): CKTOTAL, CKMB, CKMBINDEX, TROPONINI in the last 168 hours. BNP (last 3 results) No results for input(s): PROBNP in the last 8760 hours. HbA1C: No results for input(s): HGBA1C in the last 72 hours. CBG: No results for input(s): GLUCAP in the last 168 hours. Lipid Profile: No results for input(s): CHOL, HDL, LDLCALC, TRIG, CHOLHDL, LDLDIRECT in the last 72 hours. Thyroid Function Tests: No results for input(s): TSH, T4TOTAL, FREET4, T3FREE, THYROIDAB in the last 72 hours. Anemia Panel: No results for input(s): VITAMINB12, FOLATE, FERRITIN, TIBC, IRON, RETICCTPCT in the last 72 hours.  --------------------------------------------------------------------------------------------------------------- Urine analysis:    Component Value Date/Time   COLORURINE AMBER (A) 05/02/2021 1708   APPEARANCEUR HAZY (A) 05/02/2021 1708   LABSPEC 1.021 05/02/2021 1708   PHURINE 5.0 05/02/2021 1708    GLUCOSEU NEGATIVE 05/02/2021 1708   HGBUR NEGATIVE 05/02/2021 1708   BILIRUBINUR NEGATIVE 05/02/2021 1708   KETONESUR 5 (A) 05/02/2021 1708   PROTEINUR 30 (A) 05/02/2021 1708   UROBILINOGEN 0.2  04/01/2015 1140   NITRITE NEGATIVE 05/02/2021 1708   LEUKOCYTESUR NEGATIVE 05/02/2021 1708      Imaging Results:    No results found.     Assessment & Plan:    Active Problems:   Essential hypertension   ETOH abuse   Pancreatitis, recurrent   Recurrent pancreatitis Bowel rest Minutes fluids Pain control Counseled on the importance of alcohol cessation Hypertension Hydralazine and labetalol continued Alcohol abuse CIWA protocol Continue folic acid and thiamine Advised on the importance of cessation Neuropathy Continue gabapentin Anxiety Continue Vistaril    DVT Prophylaxis-   Heparin- SCDs   AM Labs Ordered, also please review Full Orders  Family Communication: No family at bedside Code Status: Full  Admission status: Observation Time spent in minutes : [place] DO

## 2021-05-19 DIAGNOSIS — F1010 Alcohol abuse, uncomplicated: Secondary | ICD-10-CM | POA: Diagnosis not present

## 2021-05-19 DIAGNOSIS — K859 Acute pancreatitis without necrosis or infection, unspecified: Secondary | ICD-10-CM | POA: Diagnosis not present

## 2021-05-19 DIAGNOSIS — I10 Essential (primary) hypertension: Secondary | ICD-10-CM | POA: Diagnosis not present

## 2021-05-19 DIAGNOSIS — K852 Alcohol induced acute pancreatitis without necrosis or infection: Principal | ICD-10-CM

## 2021-05-19 LAB — CBC WITH DIFFERENTIAL/PLATELET
Abs Immature Granulocytes: 0.03 10*3/uL (ref 0.00–0.07)
Basophils Absolute: 0 10*3/uL (ref 0.0–0.1)
Basophils Relative: 0 %
Eosinophils Absolute: 0.1 10*3/uL (ref 0.0–0.5)
Eosinophils Relative: 2 %
HCT: 27.8 % — ABNORMAL LOW (ref 36.0–46.0)
Hemoglobin: 9.3 g/dL — ABNORMAL LOW (ref 12.0–15.0)
Immature Granulocytes: 1 %
Lymphocytes Relative: 20 %
Lymphs Abs: 1 10*3/uL (ref 0.7–4.0)
MCH: 32.6 pg (ref 26.0–34.0)
MCHC: 33.5 g/dL (ref 30.0–36.0)
MCV: 97.5 fL (ref 80.0–100.0)
Monocytes Absolute: 0.4 10*3/uL (ref 0.1–1.0)
Monocytes Relative: 8 %
Neutro Abs: 3.7 10*3/uL (ref 1.7–7.7)
Neutrophils Relative %: 69 %
Platelets: 185 10*3/uL (ref 150–400)
RBC: 2.85 MIL/uL — ABNORMAL LOW (ref 3.87–5.11)
RDW: 14.3 % (ref 11.5–15.5)
WBC: 5.3 10*3/uL (ref 4.0–10.5)
nRBC: 0 % (ref 0.0–0.2)

## 2021-05-19 LAB — MAGNESIUM: Magnesium: 1.9 mg/dL (ref 1.7–2.4)

## 2021-05-19 LAB — BASIC METABOLIC PANEL
Anion gap: 3 — ABNORMAL LOW (ref 5–15)
BUN: 5 mg/dL — ABNORMAL LOW (ref 8–23)
CO2: 20 mmol/L — ABNORMAL LOW (ref 22–32)
Calcium: 9.2 mg/dL (ref 8.9–10.3)
Chloride: 113 mmol/L — ABNORMAL HIGH (ref 98–111)
Creatinine, Ser: 0.58 mg/dL (ref 0.44–1.00)
GFR, Estimated: >60 mL/min (ref >60)
Glucose, Bld: 83 mg/dL (ref 70–99)
Potassium: 2.9 mmol/L — ABNORMAL LOW (ref 3.5–5.1)
Sodium: 136 mmol/L (ref 135–145)

## 2021-05-19 LAB — GLUCOSE, CAPILLARY: Glucose-Capillary: 85 mg/dL (ref 70–99)

## 2021-05-19 MED ORDER — POTASSIUM CHLORIDE 10 MEQ/100ML IV SOLN
10.0000 meq | INTRAVENOUS | Status: AC
  Administered 2021-05-19 (×4): 10 meq via INTRAVENOUS
  Filled 2021-05-19 (×4): qty 100

## 2021-05-19 MED ORDER — BOOST / RESOURCE BREEZE PO LIQD CUSTOM
1.0000 | Freq: Three times a day (TID) | ORAL | Status: DC
  Administered 2021-05-19 – 2021-05-22 (×7): 1 via ORAL

## 2021-05-19 NOTE — Progress Notes (Signed)
Pt notified this nurse that fingers and toes feel numb states that it has been on going for a few months. MD notified and was already aware of complaint.

## 2021-05-19 NOTE — Progress Notes (Signed)
PROGRESS NOTE  Colleen Lee KXF:[id number] DOB: 1956-07-10 DOA: 05/17/2021 PCP: Sandi Mariscal, MD  HPI/Recap of past 24 hours: Colleen Lee  is a 65 y.o. female, with history of chronic abdominal pain, chronic pancreatitis, cirrhosis, [name], GERD, alcohol abuse, presents the ED with a chief complaint of worsening generalized abdominal pain, N/V X 3 days. Pt presented to the ED 05/11/21, diagnosed with acute pancreatitis, wanted to try to manage her pain at home, so was discharged. Patient reports that she has had subjective fever and chills. Patient currently smokes.  Patient drinks 1-2 bottles of wine per night.  She does not use illicit drugs.  Patient is vaccinated for COVID. In the ED afebrile, tachycardia, BP 162/100, satting at 96% on RA. Labs unremarkable, lipase 42. CT abdomen pelvis done 6 days ago showed acute pancreatitis. Pt admitted for further management.    Today, patient still complaining of generalized abdominal pain denies any nausea/vomiting, abdominal pain is distended, but patient states looks about normal for her.  Denies any chest pain, shortness of breath, fever/chills.    Assessment/Plan: Active Problems:   Essential hypertension   Acute pancreatitis   ETOH abuse   Pancreatitis, recurrent   Acute on chronic pancreatitis CT abd/pelvis showed above Ultrasound ascites pending to determine if patient has any drainable ascites Hx of present alcohol abuse Lipase 42 IVF, pain management Advance diet as tolerated Monitor closely  Hypokalemia/Hypomagnesemia Replace prn  Hypertension Stable Continue home hydralazine as well as IV hydralazine and labetalol prn   Hx of cirrhosis  Stable on CT, LFTs stable as well  Anemia of chronic disease Hemoglobin trending down, likely dilutional from IV fluids Monitor for any signs of bleeding Daily CBC  GERD Continue PPI  Alcohol abuse Advised to quit CIWA protocol, MVT, thiamine, FA  Anxiety Continue  Atarax    Estimated body mass index is 27.28 kg/m as calculated from the following:   Height as of this encounter: 5\' 6"  (1.676 m).   Weight as of this encounter: 76.7 kg.     Code Status: Full  Family Communication: None at bedside  Disposition Plan: Status is: Inpatient  Remains inpatient appropriate because: Level of care     Consultants: None  Procedures: None  Antimicrobials: None  DVT prophylaxis: Lovenox   Objective: Vitals:   05/19/21 0535 05/19/21 0829 05/19/21 1228 05/19/21 1426  BP: (!) 163/91 113/75 118/77 (!) 145/80  Pulse: 79 (!) 102 76   Resp: 18  18   Temp: 99.1 F (37.3 C)  97.9 F (36.6 C)   TempSrc: Oral  Oral   SpO2: 98%  100%   Weight:      Height:        Intake/Output Summary (Last 24 hours) at 05/19/2021 1834 Last data filed at 05/19/2021 1100 Gross per 24 hour  Intake 1602.97 ml  Output --  Net 1602.97 ml   Filed Weights   05/18/21 1433  Weight: 76.7 kg    Exam: General: NAD  Cardiovascular: S1, S2 present Respiratory: CTAB Abdomen: Soft, +tender, +distended, bowel sounds present Musculoskeletal: No bilateral pedal edema noted Skin: Normal Psychiatry: Normal mood      Data Reviewed: CBC: Recent Labs  Lab 05/17/21 1840 05/18/21 0440 05/19/21 0544  WBC 5.1 6.2 5.3  NEUTROABS 3.6  --  3.7  HGB 12.8 10.7* 9.3*  HCT 37.8 31.6* 27.8*  MCV 96.2 98.8 97.5  PLT 231 201 696   Basic Metabolic Panel: Recent Labs  Lab 05/17/21 1840 05/18/21  0440 05/19/21 0544  NA 135 134* 136  K 3.6 3.0* 2.9*  CL 105 107 113*  CO2 23 23 20*  GLUCOSE 103* 87 83  BUN 9 7* 5*  CREATININE 0.70 0.54 0.58  CALCIUM 11.0* 9.5 9.2  MG  --  1.6* 1.9   GFR: Estimated Creatinine Clearance: 73.4 mL/min (by C-G formula based on SCr of 0.58 mg/dL). Liver Function Tests: Recent Labs  Lab 05/17/21 1840 05/18/21 0440  AST 29 22  ALT 27 22  ALKPHOS 66 53  BILITOT 0.9 0.7  PROT 7.5 6.1*  ALBUMIN 3.7 3.0*   Recent Labs  Lab  05/17/21 1840  LIPASE 42   No results for input(s): AMMONIA in the last 168 hours. Coagulation Profile: No results for input(s): INR, PROTIME in the last 168 hours. Cardiac Enzymes: No results for input(s): CKTOTAL, CKMB, CKMBINDEX, TROPONINI in the last 168 hours. BNP (last 3 results) No results for input(s): PROBNP in the last 8760 hours. HbA1C: No results for input(s): HGBA1C in the last 72 hours. CBG: Recent Labs  Lab 05/19/21 0731  GLUCAP 85   Lipid Profile: No results for input(s): CHOL, HDL, LDLCALC, TRIG, CHOLHDL, LDLDIRECT in the last 72 hours. Thyroid Function Tests: No results for input(s): TSH, T4TOTAL, FREET4, T3FREE, THYROIDAB in the last 72 hours. Anemia Panel: No results for input(s): VITAMINB12, FOLATE, FERRITIN, TIBC, IRON, RETICCTPCT in the last 72 hours. Urine analysis:    Component Value Date/Time   COLORURINE AMBER (A) 05/02/2021 1708   APPEARANCEUR HAZY (A) 05/02/2021 1708   LABSPEC 1.021 05/02/2021 1708   PHURINE 5.0 05/02/2021 1708   GLUCOSEU NEGATIVE 05/02/2021 1708   HGBUR NEGATIVE 05/02/2021 1708   BILIRUBINUR NEGATIVE 05/02/2021 1708   KETONESUR 5 (A) 05/02/2021 1708   PROTEINUR 30 (A) 05/02/2021 1708   UROBILINOGEN 0.2 04/01/2015 1140   NITRITE NEGATIVE 05/02/2021 1708   LEUKOCYTESUR NEGATIVE 05/02/2021 1708   Sepsis Labs: @LABRCNTIP (procalcitonin:4,lacticidven:4)  ) Recent Results (from the past 240 hour(s))  Resp Panel by RT-PCR (Flu A&B, Covid) Nasopharyngeal Swab     Status: None   Collection Time: 05/17/21 10:54 PM   Specimen: Nasopharyngeal Swab; Nasopharyngeal(NP) swabs in vial transport medium  Result Value Ref Range Status   SARS Coronavirus 2 by RT PCR NEGATIVE NEGATIVE Final    Comment: (NOTE) SARS-CoV-2 target nucleic acids are NOT DETECTED.  The SARS-CoV-2 RNA is generally detectable in upper respiratory specimens during the acute phase of infection. The lowest concentration of SARS-CoV-2 viral copies this assay can  detect is 138 copies/mL. A negative result does not preclude SARS-Cov-2 infection and should not be used as the sole basis for treatment or other patient management decisions. A negative result may occur with  improper specimen collection/handling, submission of specimen other than nasopharyngeal swab, presence of viral mutation(s) within the areas targeted by this assay, and inadequate number of viral copies(<138 copies/mL). A negative result must be combined with clinical observations, patient history, and epidemiological information. The expected result is Negative.  Fact Sheet for Patients:  EntrepreneurPulse.com.au  Fact Sheet for Healthcare Providers:  IncredibleEmployment.be  This test is no t yet approved or cleared by the [place] FDA and  has been authorized for detection and/or diagnosis of SARS-CoV-2 by FDA under an Emergency Use Authorization (EUA). This EUA will remain  in effect (meaning this test can be used) for the duration of the COVID-19 declaration under Section 564(b)(1) of the Act, 21 U.S.C.section 360bbb-3(b)(1), unless the authorization is terminated  or revoked sooner.  Influenza A by PCR NEGATIVE NEGATIVE Final   Influenza B by PCR NEGATIVE NEGATIVE Final    Comment: (NOTE) The Xpert Xpress SARS-CoV-2/FLU/RSV plus assay is intended as an aid in the diagnosis of influenza from Nasopharyngeal swab specimens and should not be used as a sole basis for treatment. Nasal washings and aspirates are unacceptable for Xpert Xpress SARS-CoV-2/FLU/RSV testing.  Fact Sheet for Patients: EntrepreneurPulse.com.au  Fact Sheet for Healthcare Providers: IncredibleEmployment.be  This test is not yet approved or cleared by the [place] FDA and has been authorized for detection and/or diagnosis of SARS-CoV-2 by FDA under an Emergency Use Authorization (EUA). This EUA will remain in  effect (meaning this test can be used) for the duration of the COVID-19 declaration under Section 564(b)(1) of the Act, 21 U.S.C. section 360bbb-3(b)(1), unless the authorization is terminated or revoked.  Performed at [facility], [address] [place], [place] [address]       Studies: No results found.  Scheduled Meds:  cyclobenzaprine  5 mg Oral TID   enoxaparin (LOVENOX) injection  40 mg Subcutaneous Q24H   feeding supplement  1 Container Oral TID BM   folic acid  1 mg Oral Daily   gabapentin  100 mg Oral BID   hydrALAZINE  50 mg Oral Q8H   hydrOXYzine  10 mg Oral Daily   labetalol  100 mg Oral q AM   LORazepam  0-4 mg Intravenous Q6H   Followed by   Derrill Memo ON 05/20/2021] LORazepam  0-4 mg Intravenous Q12H   multivitamin with minerals  1 tablet Oral Daily   pantoprazole (PROTONIX) IV  40 mg Intravenous QHS   thiamine  100 mg Oral Daily   Or   thiamine  100 mg Intravenous Daily    Continuous Infusions:  sodium chloride 100 mL/hr at 05/19/21 0549     LOS: 1 day     Alma Friendly, MD [facility]  If 7PM-7AM, please contact night-coverage [email or web address] 05/19/2021, 6:34 PM

## 2021-05-19 NOTE — TOC Initial Note (Signed)
Transition of Care [facility]) - Initial/Assessment Note    Patient Details  Name: Colleen Lee MRN: [id number] Date of Birth: 1956-07-07  Transition of Care [facility]) CM/SW Contact:    Salome Arnt, [place] Phone Number: 05/19/2021, 9:23 AM  Clinical Narrative:  Pt admitted due to recurrent pancreatitis. She reports she lives with a friend and is independent with ADLs. Pt ambulates with a cane at baseline. She plans to return home when medically stable. TOC consulted for substance use. Pt admits to drinking a bottle a wine one day a week. She denies drinking daily as documented in chart. Pt states this is not a problem for her and does not want any resources. TOC will continue to follow.                  Expected Discharge Plan: Home/Self Care Barriers to Discharge: Continued Medical Work up   Patient Goals and CMS Choice Patient states their goals for this hospitalization and ongoing recovery are:: return home   Choice offered to / list presented to : Patient  Expected Discharge Plan and Services Expected Discharge Plan: Home/Self Care In-house Referral: Clinical Social Work     Living arrangements for the past 2 months: Single Family Home                 DME Arranged: N/A                    Prior Living Arrangements/Services Living arrangements for the past 2 months: Single Family Home Lives with:: Friends Patient language and need for interpreter reviewed:: Yes        Need for Family Participation in Patient Care: No (Comment)   Current home services: DME (cane) Criminal Activity/Legal Involvement Pertinent to Current Situation/Hospitalization: No - Comment as needed  Activities of Daily Living Home Assistive Devices/Equipment: Other (Comment) (Cane) ADL Screening (condition at time of admission) [name] cognitive ability adequate to safely complete daily activities?: Yes Is the patient deaf or have difficulty hearing?: No Does the patient have  difficulty seeing, even when wearing glasses/contacts?: No Does the patient have difficulty concentrating, remembering, or making decisions?: No Patient able to express need for assistance with ADLs?: Yes Does the patient have difficulty dressing or bathing?: No Independently performs ADLs?: Yes (appropriate for developmental age) Does the patient have difficulty walking or climbing stairs?: Yes Weakness of Legs: Both Weakness of Arms/Hands: Both  Permission Sought/Granted                  Emotional Assessment     Affect (typically observed): Accepting Orientation: : Oriented to Self, Oriented to Place, Oriented to  Time, Oriented to Situation Alcohol / Substance Use: Not Applicable Psych Involvement: No (comment)  Admission diagnosis:  Acute pancreatitis [K85.90] Recurrent pancreatitis [K85.90] Recurrent abdominal pain [R10.9] Patient Active Problem List   Diagnosis Date Noted   Pancreatitis, recurrent 05/17/2021   Dysuria 09/16/2020   Body mass index (BMI) 31.0-31.9, adult 01/07/2020   Osteoarthritis of spine with radiculopathy, 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   Acute pancreatitis 10/17/2019   ETOH abuse 10/17/2019   S/P right rotator cuff repair 01/30/19 02/06/2019   Nontraumatic complete tear of right rotator cuff    Arthritis of right acromioclavicular joint    S/P total knee replacement, 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/24/2018   Shoulder impingement, right 05/24/2018   Dehydration    Intractable cyclical vomiting 89/21/1941   Pyloric stenosis in adult    Thrombocytopenia ([place]) 12/20/2017   Leukocytosis  Malnutrition of moderate degree 12/17/2017   Acute renal failure (ARF) ([place]) 12/15/2017   Chronic abdominal pain 12/15/2017   Gastroenteritis 06/14/2016   Nausea with vomiting 06/10/2016   Uterine enlargement 06/09/2016   Intractable nausea and vomiting 06/08/2016   Acute infective gastroenteritis 06/08/2016   Diarrhea 06/08/2016   Essential hypertension 06/08/2016   GERD  (gastroesophageal reflux disease) 06/08/2016   Abdominal pain    Endometrial polyp 04/15/2016   PMB (postmenopausal bleeding) 72/25/7505   Alcoholic cirrhosis of liver without ascites ([place])    Asthma 01/21/2016   Hepatic cirrhosis ([place]) 09/22/2015   Arthritis of knee, degenerative 18/33/5825   History of Helicobacter pylori infection 10/30/2014   Lumbago with sciatica 07/01/2014   Chronic [name] with cirrhosis ([place]) 04/11/2014   De Quervain's disease (radial styloid tenosynovitis) 12/25/2013   Anorexia 11/21/2012   FH: colon cancer 11/21/2012   Early satiety 10/25/2012   Bowel habit changes 10/25/2012   Abdominal pain, epigastric 10/25/2012   Abdominal bloating 10/25/2012   Constipation 10/25/2012   Abnormal weight loss 10/25/2012   Chronic viral [name] ([place]) 10/25/2012   Radicular pain of left lower extremity 09/28/2012   Back pain 09/28/2012   Sciatica 08/10/2011   S/P arthroscopy of left knee 08/10/2011   Tibial plateau fracture 08/10/2011   Pain in joint, lower leg 02/12/2011   Stiffness of joint, not elsewhere classified, lower leg 02/12/2011   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   Meniscus, medial, derangement 12/29/2010   CLOSED FRACTURE OF UPPER END OF TIBIA 08/12/2010   PCP:  Sandi Mariscal, MD Pharmacy:   [facility], [address] [place] [address] Phone: [phone] Fax: [phone]     Social Determinants of Health (SDOH) Interventions    Readmission Risk Interventions Readmission Risk Prevention Plan 05/19/2021  Transportation Screening Complete  HRI or Home Care Consult Complete  Social Work Consult for [place] Planning/Counseling Complete  Palliative Care Screening Not Applicable  Medication Review Press photographer) Complete  Some recent data might be hidden

## 2021-05-19 NOTE — Progress Notes (Addendum)
Initial Nutrition Assessment  DOCUMENTATION CODES:   Not applicable  INTERVENTION:   Boost Breeze po TID, each supplement provides 250 kcal and 9 grams of protein  Encourage PO intake  Recommend checking Vitamin A, C, and Zinc levels - secure chat send to MD   NUTRITION DIAGNOSIS:   Increased nutrient needs related to catabolic illness (pancreatitis) as evidenced by estimated needs.  GOAL:   Patient will meet greater than or equal to 90% of their needs  MONITOR:   PO intake, Supplement acceptance, Diet advancement  REASON FOR ASSESSMENT:   Malnutrition Screening Tool    ASSESSMENT:   Pt with PMH of uncontrolled HTN, Cirrhosis, GERD, current alcohol abuse (1- 2 bottles of wine per day), chronic pancreatitis, admitted with abd pain, N/V dx with acute pancreatitis.   CT shows acute pancreatitis.  Per weight hx pt has lost 7.5% of her weight x 4 months, unsure of accuracy.  Per RN pt tolerating clear liquids today.   Medications reviewed and include: folic acid, MVI with minerals, thiamine  NS @ 100 ml/hr   Labs reviewed: K 2.9    NUTRITION - FOCUSED PHYSICAL EXAM:  RD working remotely   Diet Order:   Diet Order             Diet clear liquid Room service appropriate? Yes; Fluid consistency: Thin  Diet effective now                   EDUCATION NEEDS:   Not appropriate for education at this time  Skin:  Skin Assessment: Reviewed RN Assessment  Last BM:  10/14  Height:   Ht Readings from Last 1 Encounters:  05/18/21 5\' 6"  (1.676 m)    Weight:   Wt Readings from Last 1 Encounters:  05/18/21 76.7 kg    Ideal Body Weight:     BMI:  Body mass index is 27.28 kg/m.  Estimated Nutritional Needs:   Kcal:  2000-2200  Protein:  100-115 grams  Fluid:  > 2 L/day  Lockie Pares., RD, LDN, CNSC See AMiON for contact information

## 2021-05-19 NOTE — Plan of Care (Signed)
°  Problem: Coping: °Goal: Level of anxiety will decrease °Outcome: Progressing °  °

## 2021-05-20 ENCOUNTER — Inpatient Hospital Stay (HOSPITAL_COMMUNITY): Payer: Medicare Other

## 2021-05-20 DIAGNOSIS — K852 Alcohol induced acute pancreatitis without necrosis or infection: Secondary | ICD-10-CM | POA: Diagnosis not present

## 2021-05-20 LAB — CBC WITH DIFFERENTIAL/PLATELET
Abs Immature Granulocytes: 0.02 10*3/uL (ref 0.00–0.07)
Basophils Absolute: 0 10*3/uL (ref 0.0–0.1)
Basophils Relative: 1 %
Eosinophils Absolute: 0.2 10*3/uL (ref 0.0–0.5)
Eosinophils Relative: 4 %
HCT: 30.9 % — ABNORMAL LOW (ref 36.0–46.0)
Hemoglobin: 10.1 g/dL — ABNORMAL LOW (ref 12.0–15.0)
Immature Granulocytes: 0 %
Lymphocytes Relative: 22 %
Lymphs Abs: 1.2 10*3/uL (ref 0.7–4.0)
MCH: 32.3 pg (ref 26.0–34.0)
MCHC: 32.7 g/dL (ref 30.0–36.0)
MCV: 98.7 fL (ref 80.0–100.0)
Monocytes Absolute: 0.6 10*3/uL (ref 0.1–1.0)
Monocytes Relative: 11 %
Neutro Abs: 3.3 10*3/uL (ref 1.7–7.7)
Neutrophils Relative %: 62 %
Platelets: 171 10*3/uL (ref 150–400)
RBC: 3.13 MIL/uL — ABNORMAL LOW (ref 3.87–5.11)
RDW: 14.6 % (ref 11.5–15.5)
WBC: 5.3 10*3/uL (ref 4.0–10.5)
nRBC: 0 % (ref 0.0–0.2)

## 2021-05-20 LAB — BASIC METABOLIC PANEL
Anion gap: 4 — ABNORMAL LOW (ref 5–15)
BUN: <5 mg/dL — ABNORMAL LOW (ref 8–23)
CO2: 20 mmol/L — ABNORMAL LOW (ref 22–32)
Calcium: 9.7 mg/dL (ref 8.9–10.3)
Chloride: 112 mmol/L — ABNORMAL HIGH (ref 98–111)
Creatinine, Ser: 0.63 mg/dL (ref 0.44–1.00)
GFR, Estimated: >60 mL/min (ref >60)
Glucose, Bld: 97 mg/dL (ref 70–99)
Potassium: 3.1 mmol/L — ABNORMAL LOW (ref 3.5–5.1)
Sodium: 136 mmol/L (ref 135–145)

## 2021-05-20 MED ORDER — POLYETHYLENE GLYCOL 3350 17 G PO PACK
17.0000 g | PACK | Freq: Every day | ORAL | Status: DC
  Administered 2021-05-20 – 2021-05-21 (×2): 17 g via ORAL
  Filled 2021-05-20 (×3): qty 1

## 2021-05-20 MED ORDER — POTASSIUM CHLORIDE CRYS ER 20 MEQ PO TBCR
40.0000 meq | EXTENDED_RELEASE_TABLET | ORAL | Status: AC
  Administered 2021-05-20 (×2): 40 meq via ORAL
  Filled 2021-05-20 (×2): qty 2

## 2021-05-20 NOTE — Progress Notes (Signed)
PROGRESS NOTE    Colleen Lee  HQI:[id number] DOB: [birth year] DOA: 05/17/2021 PCP: Sandi Mariscal, MD     Brief Narrative:  Colleen Lee  is a 65 y.o. female, with history of chronic abdominal pain, chronic pancreatitis, cirrhosis, [name], GERD, alcohol abuse, presents the ED with a chief complaint of worsening generalized abdominal pain, N/V X 3 days. Pt presented to the ED 05/11/21, diagnosed with acute pancreatitis, wanted to try to manage her pain at home, so was discharged. Patient reports that she has had subjective fever and chills. Patient currently smokes.  Patient drinks 1-2 bottles of wine per night.  She does not use illicit drugs.  Patient is vaccinated for COVID. In the ED, patient was afebrile, tachycardic, BP 162/100, satting at 96% on RA. Labs unremarkable, lipase 42. CT abdomen pelvis done 6 days ago showed acute pancreatitis. Pt admitted for further management.  New events last 24 hours / Subjective: Patient states that her abdomen feels a little better, has an appetite and complains of hunger.  Abdomen remains distended.  Assessment & Plan:   Active Problems:   Essential hypertension   Acute pancreatitis   ETOH abuse   Pancreatitis, recurrent   Acute on chronic alcoholic pancreatitis -Improving, likely advance diet today after results of abdominal ultrasound completed -Continue IV fluid  History of cirrhosis, [name] -LFTs remain stable  Alcohol abuse -CIWA protocol, CIWA score 0 overnight -Cessation counseling  Hypokalemia -Replace, trend  HTN -Continue hydralazine, labetalol     DVT prophylaxis:  enoxaparin (LOVENOX) injection 40 mg Start: 05/19/21 1000 SCDs Start: 05/18/21 0005  Code Status:     Code Status Orders  (From admission, onward)           Start     Ordered   05/18/21 0005  Full code  Continuous        05/18/21 0004           Code Status History     Date Active Date Inactive Code Status Order ID Comments User  Context   01/03/2021 1915 01/05/2021 2303 Full Code [id number]  Bethena Roys, MD Inpatient   10/17/2019 2204 10/20/2019 1951 Full Code [id number]  Etta Quill, DO ED   01/28/2018 2102 01/30/2018 1715 Full Code [id number]  Truett Mainland, DO ED   12/15/2017 1750 12/22/2017 2122 Full Code [id number]  Murlean Iba, MD Inpatient   06/10/2016 1305 06/13/2016 1404 Full Code [id number]  Velvet Bathe, MD Inpatient   06/08/2016 2304 06/09/2016 1816 Full Code [id number]  Vianne Bulls, MD Inpatient   02/05/2015 1219 02/08/2015 1631 Full Code [id number]  Carole Civil, MD Inpatient      Family Communication: No family at bedside Disposition Plan:  Status is: Inpatient  Remains inpatient appropriate because: Remains on IV fluid, awaiting results of abdominal ultrasound today, may need paracentesis      Consultants:  None  Procedures:  None  Antimicrobials:  Anti-infectives (From admission, onward)    None        Objective: Vitals:   05/19/21 1228 05/19/21 1426 05/19/21 2039 05/20/21 0319  BP: 118/77 (!) 145/80 114/74 (!) 115/91  Pulse: 76  97 (!) 58  Resp: 18  20 20   Temp: 97.9 F (36.6 C)  99.1 F (37.3 C) 98.6 F (37 C)  TempSrc: Oral  Oral Oral  SpO2: 100%  99% 96%  Weight:      Height:        Intake/Output Summary (Last 24 hours)  at 05/20/2021 1222 Last data filed at 05/20/2021 0900 Gross per 24 hour  Intake 240 ml  Output --  Net 240 ml   Filed Weights   05/18/21 1433  Weight: 76.7 kg    Examination:  General exam: Appears calm and comfortable  Respiratory system: Clear to auscultation. Respiratory effort normal. No respiratory distress. No conversational dyspnea.  Cardiovascular system: S1 & S2 heard, RRR. No murmurs. No pedal edema. Gastrointestinal system: Abdomen is mildly distended, soft, mildly tender to palpation epigastrium, bowel sounds present Central nervous system: Alert and oriented. No focal neurological deficits. Speech clear.   Extremities: Symmetric in appearance  Skin: No rashes, lesions or ulcers on exposed skin  Psychiatry: Judgement and insight appear normal. Mood & affect appropriate.   Data Reviewed: I have personally reviewed following labs and imaging studies  CBC: Recent Labs  Lab 05/17/21 1840 05/18/21 0440 05/19/21 0544 05/20/21 0623  WBC 5.1 6.2 5.3 5.3  NEUTROABS 3.6  --  3.7 3.3  HGB 12.8 10.7* 9.3* 10.1*  HCT 37.8 31.6* 27.8* 30.9*  MCV 96.2 98.8 97.5 98.7  PLT 231 201 185 497   Basic Metabolic Panel: Recent Labs  Lab 05/17/21 1840 05/18/21 0440 05/19/21 0544 05/20/21 0623  NA 135 134* 136 136  K 3.6 3.0* 2.9* 3.1*  CL 105 107 113* 112*  CO2 23 23 20* 20*  GLUCOSE 103* 87 83 97  BUN 9 7* 5* <5*  CREATININE 0.70 0.54 0.58 0.63  CALCIUM 11.0* 9.5 9.2 9.7  MG  --  1.6* 1.9  --    GFR: Estimated Creatinine Clearance: 73.4 mL/min (by C-G formula based on SCr of 0.63 mg/dL). Liver Function Tests: Recent Labs  Lab 05/17/21 1840 05/18/21 0440  AST 29 22  ALT 27 22  ALKPHOS 66 53  BILITOT 0.9 0.7  PROT 7.5 6.1*  ALBUMIN 3.7 3.0*   Recent Labs  Lab 05/17/21 1840  LIPASE 42   No results for input(s): AMMONIA in the last 168 hours. Coagulation Profile: No results for input(s): INR, PROTIME in the last 168 hours. Cardiac Enzymes: No results for input(s): CKTOTAL, CKMB, CKMBINDEX, TROPONINI in the last 168 hours. BNP (last 3 results) No results for input(s): PROBNP in the last 8760 hours. HbA1C: No results for input(s): HGBA1C in the last 72 hours. CBG: Recent Labs  Lab 05/19/21 0731  GLUCAP 85   Lipid Profile: No results for input(s): CHOL, HDL, LDLCALC, TRIG, CHOLHDL, LDLDIRECT in the last 72 hours. Thyroid Function Tests: No results for input(s): TSH, T4TOTAL, FREET4, T3FREE, THYROIDAB in the last 72 hours. Anemia Panel: No results for input(s): VITAMINB12, FOLATE, FERRITIN, TIBC, IRON, RETICCTPCT in the last 72 hours. Sepsis Labs: No results for input(s):  PROCALCITON, LATICACIDVEN in the last 168 hours.  Recent Results (from the past 240 hour(s))  Resp Panel by RT-PCR (Flu A&B, Covid) Nasopharyngeal Swab     Status: None   Collection Time: 05/17/21 10:54 PM   Specimen: Nasopharyngeal Swab; Nasopharyngeal(NP) swabs in vial transport medium  Result Value Ref Range Status   SARS Coronavirus 2 by RT PCR NEGATIVE NEGATIVE Final    Comment: (NOTE) SARS-CoV-2 target nucleic acids are NOT DETECTED.  The SARS-CoV-2 RNA is generally detectable in upper respiratory specimens during the acute phase of infection. The lowest concentration of SARS-CoV-2 viral copies this assay can detect is 138 copies/mL. A negative result does not preclude SARS-Cov-2 infection and should not be used as the sole basis for treatment or other patient management decisions.  A negative result may occur with  improper specimen collection/handling, submission of specimen other than nasopharyngeal swab, presence of viral mutation(s) within the areas targeted by this assay, and inadequate number of viral copies(<138 copies/mL). A negative result must be combined with clinical observations, patient history, and epidemiological information. The expected result is Negative.  Fact Sheet for Patients:  EntrepreneurPulse.com.au  Fact Sheet for Healthcare Providers:  IncredibleEmployment.be  This test is no t yet approved or cleared by the [place] FDA and  has been authorized for detection and/or diagnosis of SARS-CoV-2 by FDA under an Emergency Use Authorization (EUA). This EUA will remain  in effect (meaning this test can be used) for the duration of the COVID-19 declaration under Section 564(b)(1) of the Act, 21 U.S.C.section 360bbb-3(b)(1), unless the authorization is terminated  or revoked sooner.       Influenza A by PCR NEGATIVE NEGATIVE Final   Influenza B by PCR NEGATIVE NEGATIVE Final    Comment: (NOTE) The Xpert Xpress  SARS-CoV-2/FLU/RSV plus assay is intended as an aid in the diagnosis of influenza from Nasopharyngeal swab specimens and should not be used as a sole basis for treatment. Nasal washings and aspirates are unacceptable for Xpert Xpress SARS-CoV-2/FLU/RSV testing.  Fact Sheet for Patients: EntrepreneurPulse.com.au  Fact Sheet for Healthcare Providers: IncredibleEmployment.be  This test is not yet approved or cleared by the [place] FDA and has been authorized for detection and/or diagnosis of SARS-CoV-2 by FDA under an Emergency Use Authorization (EUA). This EUA will remain in effect (meaning this test can be used) for the duration of the COVID-19 declaration under Section 564(b)(1) of the Act, 21 U.S.C. section 360bbb-3(b)(1), unless the authorization is terminated or revoked.  Performed at [facility], [address], [place], [place] [address]       Radiology Studies: No results found.    Scheduled Meds:  cyclobenzaprine  5 mg Oral TID   enoxaparin (LOVENOX) injection  40 mg Subcutaneous Q24H   feeding supplement  1 Container Oral TID BM   folic acid  1 mg Oral Daily   gabapentin  100 mg Oral BID   hydrALAZINE  50 mg Oral Q8H   hydrOXYzine  10 mg Oral Daily   labetalol  100 mg Oral q AM   LORazepam  0-4 mg Intravenous Q12H   multivitamin with minerals  1 tablet Oral Daily   pantoprazole (PROTONIX) IV  40 mg Intravenous QHS   thiamine  100 mg Oral Daily   Or   thiamine  100 mg Intravenous Daily   Continuous Infusions:  sodium chloride 100 mL/hr at 05/20/21 0549     LOS: 2 days      Time spent: 25 minutes   Dessa Phi, DO [facility] 05/20/2021, 12:22 PM   Available via Epic secure chat 7am-7pm After these hours, please refer to coverage provider listed on [email or web address]

## 2021-05-21 DIAGNOSIS — K859 Acute pancreatitis without necrosis or infection, unspecified: Secondary | ICD-10-CM | POA: Diagnosis not present

## 2021-05-21 LAB — HEPATIC FUNCTION PANEL
ALT: 34 U/L (ref 0–44)
AST: 47 U/L — ABNORMAL HIGH (ref 15–41)
Albumin: 2.6 g/dL — ABNORMAL LOW (ref 3.5–5.0)
Alkaline Phosphatase: 59 U/L (ref 38–126)
Bilirubin, Direct: 0.2 mg/dL (ref 0.0–0.2)
Indirect Bilirubin: 0.7 mg/dL (ref 0.3–0.9)
Total Bilirubin: 0.9 mg/dL (ref 0.3–1.2)
Total Protein: 5.5 g/dL — ABNORMAL LOW (ref 6.5–8.1)

## 2021-05-21 LAB — ZINC: Zinc: 84 ug/dL (ref 44–115)

## 2021-05-21 LAB — BASIC METABOLIC PANEL
Anion gap: 4 — ABNORMAL LOW (ref 5–15)
BUN: <5 mg/dL — ABNORMAL LOW (ref 8–23)
CO2: 18 mmol/L — ABNORMAL LOW (ref 22–32)
Calcium: 10 mg/dL (ref 8.9–10.3)
Chloride: 112 mmol/L — ABNORMAL HIGH (ref 98–111)
Creatinine, Ser: 0.52 mg/dL (ref 0.44–1.00)
GFR, Estimated: >60 mL/min (ref >60)
Glucose, Bld: 92 mg/dL (ref 70–99)
Potassium: 3.9 mmol/L (ref 3.5–5.1)
Sodium: 134 mmol/L — ABNORMAL LOW (ref 135–145)

## 2021-05-21 LAB — MAGNESIUM: Magnesium: 1.9 mg/dL (ref 1.7–2.4)

## 2021-05-21 NOTE — Plan of Care (Signed)
  Problem: Acute Rehab PT Goals(only PT should resolve) Goal: Pt Will Go Supine/Side To Sit Outcome: Progressing Flowsheets (Taken 05/21/2021 1610) Pt will go Supine/Side to Sit:  Independently  with modified independence Goal: Patient Will Transfer Sit To/From Stand Outcome: Progressing Flowsheets (Taken 05/21/2021 1610) Patient will transfer sit to/from stand: with modified independence Goal: Pt Will Transfer Bed To Chair/Chair To Bed Outcome: Progressing Flowsheets (Taken 05/21/2021 1610) Pt will Transfer Bed to Chair/Chair to Bed: with modified independence Goal: Pt Will Ambulate Outcome: Progressing Flowsheets (Taken 05/21/2021 1610) Pt will Ambulate:  > 125 feet  with supervision  with modified independence  with rolling walker Note: Quad-cane   4:11 PM, 05/21/21 Lonell Grandchild, MPT Physical Therapist with [facility] 336 [phone] office [phone] mobile phone

## 2021-05-21 NOTE — Evaluation (Signed)
Physical Therapy Evaluation Patient Details Name: Colleen Lee MRN: [id number] DOB: 1955/09/09 Today's Date: 05/21/2021  History of Present Illness  Colleen Lee  is a 65 y.o. female, with history of chronic abdominal pain, chronic pancreatitis, cirrhosis, [name], GERD, nausea and vomiting, alcohol abuse, and more presents the ED with a chief complaint of abdominal pain.  Patient reports that the pain started about 3 days ago.  It feels like a tightness.  Its diffuse across her entire abdomen.  She presented to the ED 6 days ago and was given the option to admit or discharge home, she decided to try to manage her pain at home.  She reports that since then has become more sharp and tight over the last 3 days.  She has also noticed that her abdomen is more bloated.  Her appetite is decreased, her last normal meal was 4 days ago.  She has nausea and vomiting every time she tries to eat.  She describes the emesis as undigested food, without blood.  She is able to keep down Ensure.  Her last normal bowel movement was 2 days ago.  Patient reports that she has had fever and chills.  She had no dysuria, hematuria, urinary frequency.  She has had no skin rashes or sores.  She reports feeling like tightness, and reports that her PCP told her that this was due to gout.  Patient has no other complaints at this time.   Clinical Impression  Patient functioning near baseline for functional mobility and gait, limited mostly due not having her normal walking custom support shoes in hospital resulting in steppage like gait due to baseline limited bilateral ankle dorsiflexion.  Patient unsteady using quad-cane, required use of RW to increase stability ambulating with non-skid socks demonstrating increased endurance without loss of balance.  Patient encouraged to use RW or have family bring her shoes and quad-cane to hospital.  Patient will benefit from continued physical therapy in hospital and recommended venue below  to increase strength, balance, endurance for safe ADLs and gait.         Recommendations for follow up therapy are one component of a multi-disciplinary discharge planning process, led by the attending physician.  Recommendations may be updated based on patient status, additional functional criteria and insurance authorization.  Follow Up Recommendations Home health PT;Supervision for mobility/OOB;Supervision - Intermittent    Equipment Recommendations  Rolling walker with 5" wheels    Recommendations for Other Services       Precautions / Restrictions Precautions Precautions: Fall Restrictions Weight Bearing Restrictions: No      Mobility  Bed Mobility Overal bed mobility: Modified Independent                  Transfers Overall transfer level: Needs assistance Equipment used: Quad cane;Rolling walker (2 wheeled) Transfers: Sit to/from [facility] Sit to Stand: Supervision Stand pivot transfers: Supervision       General transfer comment: unsteady using quad-cane, safer using RW mostly due to her shoes she normally wears is at home  Ambulation/Gait Ambulation/Gait assistance: Supervision;Min guard Gait Distance (Feet): 75 Feet Assistive device: Rolling walker (2 wheeled);Quad cane Gait Pattern/deviations: Step-through pattern;Decreased step length - right;Decreased step length - left;Decreased dorsiflexion - right;Decreased dorsiflexion - left;Steppage;Drifts right/left Gait velocity: decreased   General Gait Details: slow labored cadence with steppage gait due to limited bilateral ankle dorsiflexion, frequent drifting with near loss of balance using quad-cane, had to use RW for safety demonstrating slightly cadence without loss  of balance  Stairs            Wheelchair Mobility    Modified Rankin (Stroke Patients Only)       Balance Overall balance assessment: Needs assistance Sitting-balance support: Feet supported;No upper  extremity supported Sitting balance-Leahy Scale: Good Sitting balance - Comments: seated at EOB   Standing balance support: Single extremity supported Standing balance-Leahy Scale: Poor Standing balance comment: fair/poor using quad-cane, fair/good using RW                             Pertinent Vitals/Pain Pain Assessment: No/denies pain    Home Living Family/patient expects to be discharged to:: Private residence Living Arrangements: Spouse/significant other Available Help at Discharge: Family Type of Home: House Home Access: Stairs to enter Entrance Stairs-Rails: Right Entrance Stairs-Number of Steps: 4 Home Layout: One level Home Equipment: Cane - quad Additional Comments: Patient states she does not take showers due to fall risk, washes self in chair    Prior Function Level of Independence: Needs assistance   Gait / Transfers Assistance Needed: household and short distanced community ambulator using quad-cane  ADL's / Homemaking Assistance Needed: assisted by family for household and community ADLs        Hand Dominance   Dominant Hand: Right    Extremity/Trunk Assessment   Upper Extremity Assessment Upper Extremity Assessment: Overall WFL for tasks assessed    Lower Extremity Assessment Lower Extremity Assessment: Generalized weakness    Cervical / Trunk Assessment Cervical / Trunk Assessment: Normal  Communication   Communication: No difficulties  Cognition Arousal/Alertness: Awake/alert Behavior During Therapy: WFL for tasks assessed/performed Overall Cognitive Status: Within Functional Limits for tasks assessed                                        General Comments      Exercises     Assessment/Plan    PT Assessment Patient needs continued PT services  PT Problem List Decreased strength;Decreased activity tolerance;Decreased balance;Decreased mobility       PT Treatment Interventions DME instruction;Stair  training;Gait training;Functional mobility training;Therapeutic activities;Therapeutic exercise;Balance training;Patient/family education    PT Goals (Current goals can be found in the Care Plan section)  Acute Rehab PT Goals Patient Stated Goal: return home with family to assist PT Goal Formulation: With patient/family Time For Goal Achievement: 05/25/21 Potential to Achieve Goals: Good    Frequency Min 2X/week   Barriers to discharge        Co-evaluation               AM-PAC PT "6 Clicks" Mobility  Outcome Measure Help needed turning from your back to your side while in a flat bed without using bedrails?: None Help needed moving from lying on your back to sitting on the side of a flat bed without using bedrails?: None Help needed moving to and from a bed to a chair (including a wheelchair)?: A Little Help needed standing up from a chair using your arms (e.g., wheelchair or bedside chair)?: A Little Help needed to walk in hospital room?: A Little Help needed climbing 3-5 steps with a railing? : A Little 6 Click Score: 20    End of Session   Activity Tolerance: Patient tolerated treatment well;Patient limited by fatigue Patient left: in bed;with call bell/phone within reach;with family/visitor present Nurse Communication: Mobility  status PT Visit Diagnosis: Unsteadiness on feet (R26.81);Other abnormalities of gait and mobility (R26.89);Muscle weakness (generalized) (M62.81)    Time: 5498-2641 PT Time Calculation (min) (ACUTE ONLY): 24 min   Charges:   PT Evaluation $PT Eval Moderate Complexity: 1 Mod PT Treatments $Therapeutic Activity: 23-37 mins        4:09 PM, 05/21/21 Lonell Grandchild, MPT Physical Therapist with [facility] 336 [phone] office [phone] mobile phone

## 2021-05-21 NOTE — Progress Notes (Signed)
PROGRESS NOTE    Colleen Lee  IRW:[id number] DOB: 09/03/1955 DOA: 05/17/2021 PCP: Sandi Mariscal, MD     Brief Narrative:  Colleen Lee  is a 65 y.o. female, with history of chronic abdominal pain, chronic pancreatitis, cirrhosis, [name], GERD, alcohol abuse, presents the ED with a chief complaint of worsening generalized abdominal pain, N/V X 3 days. Pt presented to the ED 05/11/21, diagnosed with acute pancreatitis, wanted to try to manage her pain at home, so was discharged. Patient reports that she has had subjective fever and chills. Patient currently smokes.  Patient drinks 1-2 bottles of wine per night.  She does not use illicit drugs.  Patient is vaccinated for COVID. In the ED, patient was afebrile, tachycardic, BP 162/100, satting at 96% on RA. Labs unremarkable, lipase 42. CT abdomen pelvis done 6 days ago showed acute pancreatitis. Pt admitted for further management.  New events last 24 hours / Subjective: Has been passing a lot of gas, no bowel movement yet.  Abdomen feels less bloated.  Has only just now started full liquid diet this morning  Assessment & Plan:   Active Problems:   Essential hypertension   Acute pancreatitis   ETOH abuse   Pancreatitis, recurrent   Acute on chronic alcoholic pancreatitis -Improving, remains on full liquid diet, likely advance to soft diet this evening if tolerates  History of cirrhosis, [name] -Abdominal ultrasound without drainable ascites noted -Monitor LFTs  Alcohol abuse -CIWA protocol, CIWA score 0 overnight -Cessation counseling  HTN -Continue hydralazine, labetalol     DVT prophylaxis:  enoxaparin (LOVENOX) injection 40 mg Start: 05/19/21 1000 SCDs Start: 05/18/21 0005  Code Status:     Code Status Orders  (From admission, onward)           Start     Ordered   05/18/21 0005  Full code  Continuous        05/18/21 0004           Code Status History     Date Active Date Inactive Code Status  Order ID Comments User Context   01/03/2021 1915 01/05/2021 2303 Full Code [id number]  Bethena Roys, MD Inpatient   10/17/2019 2204 10/20/2019 1951 Full Code [id number]  Etta Quill, DO ED   01/28/2018 2102 01/30/2018 1715 Full Code [id number]  Truett Mainland, DO ED   12/15/2017 1750 12/22/2017 2122 Full Code [id number]  Murlean Iba, MD Inpatient   06/10/2016 1305 06/13/2016 1404 Full Code [id number]  Velvet Bathe, MD Inpatient   06/08/2016 2304 06/09/2016 1816 Full Code [id number]  Vianne Bulls, MD Inpatient   02/05/2015 1219 02/08/2015 1631 Full Code [id number]  Carole Civil, MD Inpatient      Family Communication: No family at bedside Disposition Plan:  Status is: Inpatient  Remains inpatient appropriate because: Slowly advance diet, need PT      Consultants:  None  Procedures:  None  Antimicrobials:  Anti-infectives (From admission, onward)    None        Objective: Vitals:   05/20/21 0319 05/20/21 1211 05/20/21 2048 05/21/21 0332  BP: (!) 115/91 138/79 100/68 118/80  Pulse: (!) 58 96 84 88  Resp: 20  20 20   Temp: 98.6 F (37 C) 98.8 F (37.1 C) 98.4 F (36.9 C) 98.4 F (36.9 C)  TempSrc: Oral Oral    SpO2: 96% 100% 100% 100%  Weight:      Height:        Intake/Output Summary (  Last 24 hours) at 05/21/2021 0957 Last data filed at 05/20/2021 1801 Gross per 24 hour  Intake 1282.9 ml  Output --  Net 1282.9 ml    Filed Weights   05/18/21 1433  Weight: 76.7 kg    Examination: General exam: Appears calm and comfortable  Respiratory system: Clear to auscultation. Respiratory effort normal. Cardiovascular system: S1 & S2 heard, RRR. No pedal edema. Gastrointestinal system: Abdomen is soft, nondistended, mildly tender to palpation right upper quadrant Central nervous system: Alert and oriented. Non focal exam. Speech clear  Extremities: Symmetric in appearance bilaterally  Skin: No rashes, lesions or ulcers on exposed skin  Psychiatry:  Judgement and insight appear stable. Mood & affect appropriate.    Data Reviewed: I have personally reviewed following labs and imaging studies  CBC: Recent Labs  Lab 05/17/21 1840 05/18/21 0440 05/19/21 0544 05/20/21 0623  WBC 5.1 6.2 5.3 5.3  NEUTROABS 3.6  --  3.7 3.3  HGB 12.8 10.7* 9.3* 10.1*  HCT 37.8 31.6* 27.8* 30.9*  MCV 96.2 98.8 97.5 98.7  PLT 231 201 185 462    Basic Metabolic Panel: Recent Labs  Lab 05/17/21 1840 05/18/21 0440 05/19/21 0544 05/20/21 0623 05/21/21 0507  NA 135 134* 136 136 134*  K 3.6 3.0* 2.9* 3.1* 3.9  CL 105 107 113* 112* 112*  CO2 23 23 20* 20* 18*  GLUCOSE 103* 87 83 97 92  BUN 9 7* 5* <5* <5*  CREATININE 0.70 0.54 0.58 0.63 0.52  CALCIUM 11.0* 9.5 9.2 9.7 10.0  MG  --  1.6* 1.9  --  1.9    GFR: Estimated Creatinine Clearance: 73.4 mL/min (by C-G formula based on SCr of 0.52 mg/dL). Liver Function Tests: Recent Labs  Lab 05/17/21 1840 05/18/21 0440 05/21/21 0507  AST 29 22 47*  ALT 27 22 34  ALKPHOS 66 53 59  BILITOT 0.9 0.7 0.9  PROT 7.5 6.1* 5.5*  ALBUMIN 3.7 3.0* 2.6*    Recent Labs  Lab 05/17/21 1840  LIPASE 42    No results for input(s): AMMONIA in the last 168 hours. Coagulation Profile: No results for input(s): INR, PROTIME in the last 168 hours. Cardiac Enzymes: No results for input(s): CKTOTAL, CKMB, CKMBINDEX, TROPONINI in the last 168 hours. BNP (last 3 results) No results for input(s): PROBNP in the last 8760 hours. HbA1C: No results for input(s): HGBA1C in the last 72 hours. CBG: Recent Labs  Lab 05/19/21 0731  GLUCAP 85    Lipid Profile: No results for input(s): CHOL, HDL, LDLCALC, TRIG, CHOLHDL, LDLDIRECT in the last 72 hours. Thyroid Function Tests: No results for input(s): TSH, T4TOTAL, FREET4, T3FREE, THYROIDAB in the last 72 hours. Anemia Panel: No results for input(s): VITAMINB12, FOLATE, FERRITIN, TIBC, IRON, RETICCTPCT in the last 72 hours. Sepsis Labs: No results for input(s):  PROCALCITON, LATICACIDVEN in the last 168 hours.  Recent Results (from the past 240 hour(s))  Resp Panel by RT-PCR (Flu A&B, Covid) Nasopharyngeal Swab     Status: None   Collection Time: 05/17/21 10:54 PM   Specimen: Nasopharyngeal Swab; Nasopharyngeal(NP) swabs in vial transport medium  Result Value Ref Range Status   SARS Coronavirus 2 by RT PCR NEGATIVE NEGATIVE Final    Comment: (NOTE) SARS-CoV-2 target nucleic acids are NOT DETECTED.  The SARS-CoV-2 RNA is generally detectable in upper respiratory specimens during the acute phase of infection. The lowest concentration of SARS-CoV-2 viral copies this assay can detect is 138 copies/mL. A negative result does not preclude SARS-Cov-2  infection and should not be used as the sole basis for treatment or other patient management decisions. A negative result may occur with  improper specimen collection/handling, submission of specimen other than nasopharyngeal swab, presence of viral mutation(s) within the areas targeted by this assay, and inadequate number of viral copies(<138 copies/mL). A negative result must be combined with clinical observations, patient history, and epidemiological information. The expected result is Negative.  Fact Sheet for Patients:  EntrepreneurPulse.com.au  Fact Sheet for Healthcare Providers:  IncredibleEmployment.be  This test is no t yet approved or cleared by the [place] FDA and  has been authorized for detection and/or diagnosis of SARS-CoV-2 by FDA under an Emergency Use Authorization (EUA). This EUA will remain  in effect (meaning this test can be used) for the duration of the COVID-19 declaration under Section 564(b)(1) of the Act, 21 U.S.C.section 360bbb-3(b)(1), unless the authorization is terminated  or revoked sooner.       Influenza A by PCR NEGATIVE NEGATIVE Final   Influenza B by PCR NEGATIVE NEGATIVE Final    Comment: (NOTE) The Xpert Xpress  SARS-CoV-2/FLU/RSV plus assay is intended as an aid in the diagnosis of influenza from Nasopharyngeal swab specimens and should not be used as a sole basis for treatment. Nasal washings and aspirates are unacceptable for Xpert Xpress SARS-CoV-2/FLU/RSV testing.  Fact Sheet for Patients: EntrepreneurPulse.com.au  Fact Sheet for Healthcare Providers: IncredibleEmployment.be  This test is not yet approved or cleared by the [place] FDA and has been authorized for detection and/or diagnosis of SARS-CoV-2 by FDA under an Emergency Use Authorization (EUA). This EUA will remain in effect (meaning this test can be used) for the duration of the COVID-19 declaration under Section 564(b)(1) of the Act, 21 U.S.C. section 360bbb-3(b)(1), unless the authorization is terminated or revoked.  Performed at [facility], [address] [place], [place] [address]        Radiology Studies: [place] ASCITES (ABDOMEN LIMITED)  Result Date: 05/20/2021 CLINICAL DATA:  Abdominal distension, check for ascites EXAM: LIMITED ABDOMEN ULTRASOUND FOR ASCITES TECHNIQUE: Limited ultrasound survey for ascites was performed in all four abdominal quadrants. COMPARISON:  CT 05/11/2021 FINDINGS: No free fluid is seen in the 4 quadrants of the abdomen. IMPRESSION: No sonographically evident ascites in the 4 quadrants of the abdomen. Electronically Signed   By: Maurine Simmering M.D.   On: 05/20/2021 13:54      Scheduled Meds:  cyclobenzaprine  5 mg Oral TID   enoxaparin (LOVENOX) injection  40 mg Subcutaneous Q24H   feeding supplement  1 Container Oral TID BM   folic acid  1 mg Oral Daily   gabapentin  100 mg Oral BID   hydrALAZINE  50 mg Oral Q8H   hydrOXYzine  10 mg Oral Daily   labetalol  100 mg Oral q AM   LORazepam  0-4 mg Intravenous Q12H   multivitamin with minerals  1 tablet Oral Daily   pantoprazole (PROTONIX) IV  40 mg Intravenous QHS   polyethylene glycol  17 g Oral Daily    thiamine  100 mg Oral Daily   Or   thiamine  100 mg Intravenous Daily   Continuous Infusions:     LOS: 3 days      Time spent: 25 minutes   Dessa Phi, DO [facility] 05/21/2021, 9:57 AM   Available via Epic secure chat 7am-7pm After these hours, please refer to coverage provider listed on [email or web address]

## 2021-05-22 DIAGNOSIS — K859 Acute pancreatitis without necrosis or infection, unspecified: Secondary | ICD-10-CM | POA: Diagnosis not present

## 2021-05-22 LAB — COMPREHENSIVE METABOLIC PANEL
ALT: 36 U/L (ref 0–44)
AST: 46 U/L — ABNORMAL HIGH (ref 15–41)
Albumin: 2.5 g/dL — ABNORMAL LOW (ref 3.5–5.0)
Alkaline Phosphatase: 59 U/L (ref 38–126)
Anion gap: 3 — ABNORMAL LOW (ref 5–15)
BUN: 5 mg/dL — ABNORMAL LOW (ref 8–23)
CO2: 22 mmol/L (ref 22–32)
Calcium: 9.8 mg/dL (ref 8.9–10.3)
Chloride: 109 mmol/L (ref 98–111)
Creatinine, Ser: 0.61 mg/dL (ref 0.44–1.00)
GFR, Estimated: >60 mL/min (ref >60)
Glucose, Bld: 94 mg/dL (ref 70–99)
Potassium: 3.6 mmol/L (ref 3.5–5.1)
Sodium: 134 mmol/L — ABNORMAL LOW (ref 135–145)
Total Bilirubin: 0.7 mg/dL (ref 0.3–1.2)
Total Protein: 5.2 g/dL — ABNORMAL LOW (ref 6.5–8.1)

## 2021-05-22 MED ORDER — POTASSIUM CHLORIDE CRYS ER 20 MEQ PO TBCR
40.0000 meq | EXTENDED_RELEASE_TABLET | Freq: Once | ORAL | Status: AC
  Administered 2021-05-22: 40 meq via ORAL
  Filled 2021-05-22: qty 2

## 2021-05-22 MED ORDER — MORPHINE SULFATE (PF) 2 MG/ML IV SOLN: 1.0000 mg | INTRAVENOUS | Status: DC | PRN

## 2021-05-22 NOTE — TOC Transition Note (Addendum)
Transition of Care [facility]) - CM/SW Discharge Note   Patient Details  Name: Colleen Lee MRN: [id number] Date of Birth: 01/17/1956  [facility] of [facility]) CM/SW Contact:  Iona Beard, [place] Phone Number: 05/22/2021, 10:48 AM   Clinical Narrative:    CSW spoke with pt about PT recommending [place] [facility] at d/c. Pt states she already has someone in her home and that she is working on it. CSW inquired if pt would like [facility] PT services set up for her. Pt states she already has this and does not need CSW to set anything up. TOC signing off.    Final next level of care: Home/Self Care Barriers to Discharge: Barriers Resolved   Patient Goals and CMS Choice Patient states their goals for this hospitalization and ongoing recovery are:: Return home CMS Medicare.gov Compare Post Acute Care list provided to:: Patient Choice offered to / list presented to : Patient  Discharge Placement                       Discharge Plan and Services In-house Referral: Clinical Social Work              DME Arranged: N/A                    Social Determinants of Health ([place]) Interventions     Readmission Risk Interventions Readmission Risk Prevention Plan 05/19/2021  Transportation Screening Complete  HRI or [place] Complete  Social Work Consult for [place] Planning/Counseling Complete  Palliative Care Screening Not Applicable  Medication Review Press photographer) Complete  Some recent data might be hidden

## 2021-05-22 NOTE — Progress Notes (Signed)
PT STATED PAIN 10 OF 10 REQUESTING prn OF oxyCODONE (Oxy IR/ROXICODONE) immediate release tablet 5 mg   PT IS SITTING IN BED AND ABLE TO MAKE NEEDS KNOWN. WILL CONTINUE TO MONITOR. CALL BELL IN [facility] WITH BED ALARM ON.

## 2021-05-22 NOTE — Discharge Summary (Signed)
Physician Discharge Summary  [name]:[id number] DOB: [birth year] DOA: 05/17/2021  PCP: Sandi Mariscal, MD  Admit date: 05/17/2021 Discharge date: 05/22/2021  Admitted From: Home Disposition:  Home with home health   Recommendations for Outpatient Follow-up:  Follow up with PCP in 1 week Encouraged alcohol cessation  Discharge Condition: Stable CODE STATUS: Full  Diet recommendation: Soft diet   Brief/Interim Summary: Colleen Lee  is a 65 y.o. female, with history of chronic abdominal pain, chronic pancreatitis, cirrhosis, [name], GERD, alcohol abuse, presents the ED with a chief complaint of worsening generalized abdominal pain, N/V X 3 days. Pt presented to the ED 05/11/21, diagnosed with acute pancreatitis, wanted to try to manage her pain at home, so was discharged. Patient reports that she has had subjective fever and chills. Patient currently smokes.  Patient drinks 1-2 bottles of wine per night.  She does not use illicit drugs.  Patient is vaccinated for COVID. In the ED, patient was afebrile, tachycardic, BP 162/100, satting at 96% on RA. Labs unremarkable, lipase 42. CT abdomen pelvis done 6 days ago showed acute pancreatitis. Pt admitted for further management. Patient continued to improve with supportive care, diet was advanced and patient's pain improved.   Discharge Diagnoses:  Principal Problem:   Pancreatitis, recurrent Active Problems:   Essential hypertension   Acute pancreatitis   ETOH abuse  Acute on chronic alcoholic pancreatitis -Resolved with supportive care   History of cirrhosis, [name] -Abdominal ultrasound without drainable ascites noted -Monitor LFTs  Alcohol abuse -CIWA protocol, CIWA score 0 overnight -Cessation counseling   HTN -Continue hydralazine, labetalol   Discharge Instructions  Discharge Instructions     Call MD for:  difficulty breathing, headache or visual disturbances   Complete by: As directed    Call MD for:   extreme fatigue   Complete by: As directed    Call MD for:  persistant dizziness or light-headedness   Complete by: As directed    Call MD for:  persistant nausea and vomiting   Complete by: As directed    Call MD for:  severe uncontrolled pain   Complete by: As directed    Call MD for:  temperature >100.4   Complete by: As directed    Discharge instructions   Complete by: As directed    You were cared for by a hospitalist during your hospital stay. If you have any questions about your discharge medications or the care you received while you were in the hospital after you are discharged, you can call the unit and ask to speak with the hospitalist on call if the hospitalist that took care of you is not available. Once you are discharged, your primary care physician will handle any further medical issues. Please note that NO REFILLS for any discharge medications will be authorized once you are discharged, as it is imperative that you return to your primary care physician (or establish a relationship with a primary care physician if you do not have one) for your aftercare needs so that they can reassess your need for medications and monitor your lab values.   Increase activity slowly   Complete by: As directed       Allergies as of 05/22/2021       Reactions   Penicillins Rash   Did it involve swelling of the face/tongue/throat, SOB, or low BP? No Did it involve sudden or severe rash/hives, skin peeling, or any reaction on the inside of your mouth or nose? No  Did you need to seek medical attention at a hospital or doctor's office? No When did it last happen? Many years ago      If all above answers are "NO", may proceed with cephalosporin use.        Medication List     STOP taking these medications    cefdinir 300 MG capsule Commonly known as: OMNICEF   oxyCODONE-acetaminophen 5-325 MG tablet Commonly known as: PERCOCET/ROXICET   potassium chloride 10 MEQ CR capsule Commonly  known as: MICRO-K       TAKE these medications    albuterol 108 (90 Base) MCG/ACT inhaler Commonly known as: VENTOLIN HFA Inhale 2 puffs into the lungs every 6 (six) hours as needed for wheezing or shortness of breath.   allopurinol 300 MG tablet Commonly known as: ZYLOPRIM Take 300 mg by mouth daily.   cyclobenzaprine 5 MG tablet Commonly known as: FLEXERIL Take 5 mg by mouth 3 (three) times daily.   folic acid 1 MG tablet Commonly known as: FOLVITE Take 1 tablet (1 mg total) by mouth daily.   gabapentin 100 MG capsule Commonly known as: NEURONTIN Take 1 capsule (100 mg total) by mouth 3 (three) times daily. What changed:  when to take this additional instructions   hydrALAZINE 50 MG tablet Commonly known as: APRESOLINE Take 1 tablet (50 mg total) by mouth every 8 (eight) hours.   HYDROcodone-acetaminophen 5-325 MG tablet Commonly known as: NORCO/VICODIN Take 1 tablet by mouth 3 (three) times daily as needed for pain.   HYDROmorphone 4 MG tablet Commonly known as: Dilaudid Take 1 tablet (4 mg total) by mouth every 6 (six) hours as needed for severe pain.   hydrOXYzine 10 MG tablet Commonly known as: ATARAX/VISTARIL Take 10 mg by mouth daily.   labetalol 100 MG tablet Commonly known as: NORMODYNE Take 2 tablets (200 mg total) by mouth 2 (two) times daily. What changed:  how much to take when to take this   megestrol 40 MG tablet Commonly known as: MEGACE Take 40 mg by mouth daily.   naloxone 4 MG/0.1ML Liqd nasal spray kit Commonly known as: NARCAN See admin instructions. ADMINISTER A SINGLE SPRAYEOF NARCAN INTO ONECNOSTRIL. REPEAT AFTER 3SMINUTES IN OTHER NOSTRIL IF NO RESPONSE.   ondansetron 4 MG disintegrating tablet Commonly known as: Zofran ODT 66m ODT q4 hours prn nausea/vomit   pantoprazole 40 MG tablet Commonly known as: PROTONIX TAKE (1) TABLET BY MOUTH DAILY 30 MINUTES BEFORE BREAKFAST. What changed: See the new instructions.    polyethylene glycol 17 g packet Commonly known as: MiraLax Take 17 g by mouth daily as needed for mild constipation.   thiamine 100 MG tablet Take 1 tablet (100 mg total) by mouth daily.   Trulance 3 MG Tabs Generic drug: Plecanatide Take 3 mg by mouth daily.        Follow-up Information     SSandi Mariscal MD .   Specialty: Internal Medicine Contact information: [phone]               Allergies  Allergen Reactions   Penicillins Rash    Did it involve swelling of the face/tongue/throat, SOB, or low BP? No Did it involve sudden or severe rash/hives, skin peeling, or any reaction on the inside of your mouth or nose? No Did you need to seek medical attention at a hospital or doctor's office? No When did it last happen? Many years ago      If  all above answers are "NO", may proceed with cephalosporin use.      Consultations: None    Procedures/Studies: CT ABDOMEN PELVIS W CONTRAST  Result Date: 05/11/2021 CLINICAL DATA:  Abdominal abscess or infection suspected EXAM: CT ABDOMEN AND PELVIS WITH CONTRAST TECHNIQUE: Multidetector CT imaging of the abdomen and pelvis was performed using the standard protocol following bolus administration of intravenous contrast. CONTRAST:  81m OMNIPAQUE IOHEXOL 350 MG/ML SOLN COMPARISON:  None. FINDINGS: LOWER CHEST: Normal. HEPATOBILIARY: Normal hepatic contours. No intra- or extrahepatic biliary dilatation. The gallbladder is normal. PANCREAS: There is mild inflammatory stranding surrounding the pancreatic body and tail. No peripancreatic fluid collection. SPLEEN: Normal. ADRENALS/URINARY TRACT: The adrenal glands are normal. No hydronephrosis, nephroureterolithiasis or solid renal mass. The urinary bladder is normal for degree of distention STOMACH/BOWEL: There is no hiatal hernia. Normal duodenal course and caliber. No small bowel dilatation or inflammation. No focal colonic abnormality.  Normal appendix. VASCULAR/LYMPHATIC: There is calcific atherosclerosis of the abdominal aorta. No lymphadenopathy. REPRODUCTIVE: Calcified uterine fibroids. MUSCULOSKELETAL. No bony spinal canal stenosis or focal osseous abnormality. OTHER: None. IMPRESSION: Mild inflammatory stranding surrounding the pancreatic body and tail, consistent with acute pancreatitis. No peripancreatic fluid collection. Aortic Atherosclerosis (ICD10-I70.0). Electronically Signed   By: KUlyses JarredM.D.   On: 05/11/2021 21:15   [place]ES (ABDOMEN LIMITED)  Result Date: 05/20/2021 CLINICAL DATA:  Abdominal distension, check for ascites EXAM: LIMITED ABDOMEN ULTRASOUND FOR ASCITES TECHNIQUE: Limited ultrasound survey for ascites was performed in all four abdominal quadrants. COMPARISON:  CT 05/11/2021 FINDINGS: No free fluid is seen in the 4 quadrants of the abdomen. IMPRESSION: No sonographically evident ascites in the 4 quadrants of the abdomen. Electronically Signed   By: JMaurine SimmeringM.D.   On: 05/20/2021 13:54       Discharge Exam: Vitals:   05/22/21 0617 05/22/21 0903  BP: 109/81 (!) 125/91  Pulse: 90 (!) 106  Resp: 15 19  Temp: 98 F (36.7 C) 98.6 F (37 C)  SpO2: 100% 100%    General: Pt is alert, awake, not in acute distress Cardiovascular: RRR, S1/S2 +, no edema Respiratory: CTA bilaterally, no wheezing, no rhonchi, no respiratory distress, no conversational dyspnea  Abdominal: Soft, NT, ND, bowel sounds + Extremities: no edema, no cyanosis Psych: Normal mood and affect, stable judgement and insight     The results of significant diagnostics from this hospitalization (including imaging, microbiology, ancillary and laboratory) are listed below for reference.     Microbiology: Recent Results (from the past 240 hour(s))  Resp Panel by RT-PCR (Flu A&B, Covid) Nasopharyngeal Swab     Status: None   Collection Time: 05/17/21 10:54 PM   Specimen: Nasopharyngeal Swab; Nasopharyngeal(NP) swabs in vial  transport medium  Result Value Ref Range Status   SARS Coronavirus 2 by RT PCR NEGATIVE NEGATIVE Final    Comment: (NOTE) SARS-CoV-2 target nucleic acids are NOT DETECTED.  The SARS-CoV-2 RNA is generally detectable in upper respiratory specimens during the acute phase of infection. The lowest concentration of SARS-CoV-2 viral copies this assay can detect is 138 copies/mL. A negative result does not preclude SARS-Cov-2 infection and should not be used as the sole basis for treatment or other patient management decisions. A negative result may occur with  improper specimen collection/handling, submission of specimen other than nasopharyngeal swab, presence of viral mutation(s) within the areas targeted by this assay, and inadequate number of viral copies(<138 copies/mL). A negative result must be combined with clinical observations, patient history, and epidemiological  information. The expected result is Negative.  Fact Sheet for Patients:  EntrepreneurPulse.com.au  Fact Sheet for Healthcare Providers:  IncredibleEmployment.be  This test is no t yet approved or cleared by the [place] FDA and  has been authorized for detection and/or diagnosis of SARS-CoV-2 by FDA under an Emergency Use Authorization (EUA). This EUA will remain  in effect (meaning this test can be used) for the duration of the COVID-19 declaration under Section 564(b)(1) of the Act, 21 U.S.C.section 360bbb-3(b)(1), unless the authorization is terminated  or revoked sooner.       Influenza A by PCR NEGATIVE NEGATIVE Final   Influenza B by PCR NEGATIVE NEGATIVE Final    Comment: (NOTE) The Xpert Xpress SARS-CoV-2/FLU/RSV plus assay is intended as an aid in the diagnosis of influenza from Nasopharyngeal swab specimens and should not be used as a sole basis for treatment. Nasal washings and aspirates are unacceptable for Xpert Xpress SARS-CoV-2/FLU/RSV testing.  Fact  Sheet for Patients: EntrepreneurPulse.com.au  Fact Sheet for Healthcare Providers: IncredibleEmployment.be  This test is not yet approved or cleared by the [place] FDA and has been authorized for detection and/or diagnosis of SARS-CoV-2 by FDA under an Emergency Use Authorization (EUA). This EUA will remain in effect (meaning this test can be used) for the duration of the COVID-19 declaration under Section 564(b)(1) of the Act, 21 U.S.C. section 360bbb-3(b)(1), unless the authorization is terminated or revoked.  Performed at [facility], [address] [place], [place] [address]      Labs: BNP (last 3 results) No results for input(s): BNP in the last 8760 hours. Basic Metabolic Panel: Recent Labs  Lab 05/18/21 0440 05/19/21 0544 05/20/21 0623 05/21/21 0507 05/22/21 0545  NA 134* 136 136 134* 134*  K 3.0* 2.9* 3.1* 3.9 3.6  CL 107 113* 112* 112* 109  CO2 23 20* 20* 18* 22  GLUCOSE 87 83 97 92 94  BUN 7* 5* <5* <5* 5*  CREATININE 0.54 0.58 0.63 0.52 0.61  CALCIUM 9.5 9.2 9.7 10.0 9.8  MG 1.6* 1.9  --  1.9  --    Liver Function Tests: Recent Labs  Lab 05/17/21 1840 05/18/21 0440 05/21/21 0507 05/22/21 0545  AST 29 22 47* 46*  ALT 27 22 34 36  ALKPHOS 66 53 59 59  BILITOT 0.9 0.7 0.9 0.7  PROT 7.5 6.1* 5.5* 5.2*  ALBUMIN 3.7 3.0* 2.6* 2.5*   Recent Labs  Lab 05/17/21 1840  LIPASE 42   No results for input(s): AMMONIA in the last 168 hours. CBC: Recent Labs  Lab 05/17/21 1840 05/18/21 0440 05/19/21 0544 05/20/21 0623  WBC 5.1 6.2 5.3 5.3  NEUTROABS 3.6  --  3.7 3.3  HGB 12.8 10.7* 9.3* 10.1*  HCT 37.8 31.6* 27.8* 30.9*  MCV 96.2 98.8 97.5 98.7  PLT 231 201 185 171   Cardiac Enzymes: No results for input(s): CKTOTAL, CKMB, CKMBINDEX, TROPONINI in the last 168 hours. BNP: Invalid input(s): POCBNP CBG: Recent Labs  Lab 05/19/21 0731  GLUCAP 85   D-Dimer No results for input(s): DDIMER in the last 72  hours. Hgb A1c No results for input(s): HGBA1C in the last 72 hours. Lipid Profile No results for input(s): CHOL, HDL, LDLCALC, TRIG, CHOLHDL, LDLDIRECT in the last 72 hours. Thyroid function studies No results for input(s): TSH, T4TOTAL, T3FREE, THYROIDAB in the last 72 hours.  Invalid input(s): FREET3 Anemia work up No results for input(s): VITAMINB12, FOLATE, FERRITIN, TIBC, IRON, RETICCTPCT in the last 72 hours. Urinalysis  Component Value Date/Time   COLORURINE AMBER (A) 05/02/2021 1708   APPEARANCEUR HAZY (A) 05/02/2021 1708   LABSPEC 1.021 05/02/2021 1708   PHURINE 5.0 05/02/2021 1708   GLUCOSEU NEGATIVE 05/02/2021 1708   HGBUR NEGATIVE 05/02/2021 1708   BILIRUBINUR NEGATIVE 05/02/2021 1708   KETONESUR 5 (A) 05/02/2021 1708   PROTEINUR 30 (A) 05/02/2021 1708   UROBILINOGEN 0.2 04/01/2015 1140   NITRITE NEGATIVE 05/02/2021 1708   LEUKOCYTESUR NEGATIVE 05/02/2021 1708   Sepsis Labs Invalid input(s): PROCALCITONIN,  WBC,  LACTICIDVEN Microbiology Recent Results (from the past 240 hour(s))  Resp Panel by RT-PCR (Flu A&B, Covid) Nasopharyngeal Swab     Status: None   Collection Time: 05/17/21 10:54 PM   Specimen: Nasopharyngeal Swab; Nasopharyngeal(NP) swabs in vial transport medium  Result Value Ref Range Status   SARS Coronavirus 2 by RT PCR NEGATIVE NEGATIVE Final    Comment: (NOTE) SARS-CoV-2 target nucleic acids are NOT DETECTED.  The SARS-CoV-2 RNA is generally detectable in upper respiratory specimens during the acute phase of infection. The lowest concentration of SARS-CoV-2 viral copies this assay can detect is 138 copies/mL. A negative result does not preclude SARS-Cov-2 infection and should not be used as the sole basis for treatment or other patient management decisions. A negative result may occur with  improper specimen collection/handling, submission of specimen other than nasopharyngeal swab, presence of viral mutation(s) within the areas targeted by  this assay, and inadequate number of viral copies(<138 copies/mL). A negative result must be combined with clinical observations, patient history, and epidemiological information. The expected result is Negative.  Fact Sheet for Patients:  EntrepreneurPulse.com.au  Fact Sheet for Healthcare Providers:  IncredibleEmployment.be  This test is no t yet approved or cleared by the [place] FDA and  has been authorized for detection and/or diagnosis of SARS-CoV-2 by FDA under an Emergency Use Authorization (EUA). This EUA will remain  in effect (meaning this test can be used) for the duration of the COVID-19 declaration under Section 564(b)(1) of the Act, 21 U.S.C.section 360bbb-3(b)(1), unless the authorization is terminated  or revoked sooner.       Influenza A by PCR NEGATIVE NEGATIVE Final   Influenza B by PCR NEGATIVE NEGATIVE Final    Comment: (NOTE) The Xpert Xpress SARS-CoV-2/FLU/RSV plus assay is intended as an aid in the diagnosis of influenza from Nasopharyngeal swab specimens and should not be used as a sole basis for treatment. Nasal washings and aspirates are unacceptable for Xpert Xpress SARS-CoV-2/FLU/RSV testing.  Fact Sheet for Patients: EntrepreneurPulse.com.au  Fact Sheet for Healthcare Providers: IncredibleEmployment.be  This test is not yet approved or cleared by the [place] FDA and has been authorized for detection and/or diagnosis of SARS-CoV-2 by FDA under an Emergency Use Authorization (EUA). This EUA will remain in effect (meaning this test can be used) for the duration of the COVID-19 declaration under Section 564(b)(1) of the Act, 21 U.S.C. section 360bbb-3(b)(1), unless the authorization is terminated or revoked.  Performed at [facility], [address] [place], [place] [address]      Patient was seen and examined on the day of discharge and was found to be in  stable condition. Time coordinating discharge: 25 minutes including assessment and coordination of care, as well as examination of the patient.   SIGNED:  Dessa Phi, DO [facility] 05/22/2021, 9:08 AM

## 2021-05-22 NOTE — Care Management Important Message (Signed)
Important Message  Patient Details  Name: Colleen Lee MRN: [id number] Date of Birth: [birth year]   Medicare Important Message Given:  Yes     Tommy Medal 05/22/2021, 11:03 AM

## 2021-05-22 NOTE — Progress Notes (Signed)
Pt being prepared for dc, requested prn pain med for pain in stomach rated 10 of 10 for ride home. Pt with brother and sister at bedside. Iv pulled and in tact. No other needs made known at this time.

## 2021-05-25 ENCOUNTER — Other Ambulatory Visit: Payer: Self-pay | Admitting: *Deleted

## 2021-05-25 LAB — VITAMIN C: Vitamin C: 0.1 mg/dL — ABNORMAL LOW (ref 0.4–2.0)

## 2021-05-25 LAB — VITAMIN A: Vitamin A (Retinoic Acid): 13.6 ug/dL — ABNORMAL LOW (ref 22.0–69.5)

## 2021-05-25 NOTE — Patient Outreach (Signed)
[place] [facility]) Care Management  05/25/2021  AUGUSTA MIRKIN September 24, 1955 [id number]   Referral Date: 10/24 Referral Source: Data Analysis Referral Reason: Recent hospital discharge Insurance: [place] attempt #1, unsuccessful.  Mailbox for listed home number is full.  HIPAA compliant voice message left on listed mobile number.  Plan: RN CM will send outreach letter and follow up within the next 3-4 business days.  Valente David, [place], MSN [place] [phone]

## 2021-05-28 ENCOUNTER — Other Ambulatory Visit: Payer: Self-pay | Admitting: *Deleted

## 2021-05-28 NOTE — Patient Outreach (Signed)
[place] [facility]) Care Management  [place]  05/28/2021   Colleen Lee 02-Jul-1956 [id number]   Referral Date: 10/24 Referral Source: Data Analysis Referral Reason: Recent hospital discharge Insurance: [place] attempt #2, successful.  Identity verified.  This care manager introduced self and stated purpose of call.  [facility] care management services explained.    Social: Lives with husband, state he has been sick lately as well but they both work to care for each other.  She does have siblings in the area that are also available if needed.  State she is independent in ADL's, takes her time getting them done.    Conditions: Per chart, history of HTN, Hep C with cirrhosis, GERD, falls, ETOH, and most recently admitted to hospital for pancreatitis.  State she has tried to modify her diet to decrease GI symptoms.  Will see PCP on 11/3 and GI on 11/29.  Unable to complete assessment, member state she has some thing to take care of and will call this RNCM back.  Encounter Medications:  Outpatient Encounter Medications as of 05/28/2021  Medication Sig Note   albuterol (PROVENTIL HFA;VENTOLIN HFA) 108 (90 Base) MCG/ACT inhaler Inhale 2 puffs into the lungs every 6 (six) hours as needed for wheezing or shortness of breath.     allopurinol (ZYLOPRIM) 300 MG tablet Take 300 mg by mouth daily.    cyclobenzaprine (FLEXERIL) 5 MG tablet Take 5 mg by mouth 3 (three) times daily.    folic acid (FOLVITE) 1 MG tablet Take 1 tablet (1 mg total) by mouth daily.    gabapentin (NEURONTIN) 100 MG capsule Take 1 capsule (100 mg total) by mouth 3 (three) times daily. (Patient taking differently: Take 100 mg by mouth 2 (two) times daily. Takes 1 tablet in the morning and 1 tablet in the evening)    hydrALAZINE (APRESOLINE) 50 MG tablet Take 1 tablet (50 mg total) by mouth every 8 (eight) hours.    HYDROcodone-acetaminophen (NORCO/VICODIN) 5-325 MG tablet Take 1 tablet by mouth 3  (three) times daily as needed for pain.    HYDROmorphone (DILAUDID) 4 MG tablet Take 1 tablet (4 mg total) by mouth every 6 (six) hours as needed for severe pain.    hydrOXYzine (ATARAX/VISTARIL) 10 MG tablet Take 10 mg by mouth daily.     labetalol (NORMODYNE) 100 MG tablet Take 2 tablets (200 mg total) by mouth 2 (two) times daily. (Patient taking differently: Take 100 mg by mouth in the morning.) 09/27/2020: Pt reports she didn't know of it being bid. She has been taking qd.   megestrol (MEGACE) 40 MG tablet Take 40 mg by mouth daily.    naloxone (NARCAN) nasal spray 4 mg/0.1 mL See admin instructions. ADMINISTER A SINGLE SPRAYEOF NARCAN INTO ONECNOSTRIL. REPEAT AFTER 3SMINUTES IN OTHER NOSTRIL IF NO RESPONSE.    ondansetron (ZOFRAN ODT) 4 MG disintegrating tablet 4mg  ODT q4 hours prn nausea/vomit    pantoprazole (PROTONIX) 40 MG tablet TAKE (1) TABLET BY MOUTH DAILY 30 MINUTES BEFORE BREAKFAST. (Patient taking differently: Take 40 mg by mouth daily.)    polyethylene glycol (MIRALAX) 17 g packet Take 17 g by mouth daily as needed for mild constipation.    thiamine 100 MG tablet Take 1 tablet (100 mg total) by mouth daily. (Patient not taking: Reported on 05/18/2021)    TRULANCE 3 MG TABS Take 3 mg by mouth daily. (Patient not taking: Reported on 05/18/2021) 04/20/2021: Need refill   No facility-administered encounter medications  on file as of 05/28/2021.    Functional Status:  In your present state of health, do you have any difficulty performing the following activities: 05/18/2021 01/03/2021  Hearing? N N  Vision? N Y  Difficulty concentrating or making decisions? N Y  Walking or climbing stairs? Y N  Dressing or bathing? N N  Doing errands, shopping? N N  Some recent data might be hidden    Fall/Depression Screening: Fall Risk  05/28/2021 12/29/2017  Falls in the past year? 1 No  Number falls in past yr: 1 -  Injury with Fall? 0 -  Risk for fall due to : History of fall(s) -  Follow  up Falls evaluation completed -   PHQ 2/9 Scores 05/28/2021 12/29/2017 04/22/2016  PHQ - 2 Score 0 0 0      Plan:  Follow-up: Will await call back, if no call back, will Follow-up in 2 week(s) to complete initial assessment and develop plan of care.    Valente David, [place], MSN [place] [phone]

## 2021-05-29 ENCOUNTER — Encounter (HOSPITAL_COMMUNITY): Payer: Self-pay | Admitting: *Deleted

## 2021-05-29 ENCOUNTER — Emergency Department (HOSPITAL_COMMUNITY)
Admission: EM | Admit: 2021-05-29 | Discharge: 2021-05-29 | Disposition: A | Discharge status: Home / Self Care | Payer: Medicare Other | Attending: Emergency Medicine | Admitting: Emergency Medicine

## 2021-05-29 ENCOUNTER — Emergency Department (HOSPITAL_COMMUNITY): Payer: Medicare Other

## 2021-05-29 DIAGNOSIS — K5900 Constipation, unspecified: Secondary | ICD-10-CM | POA: Diagnosis not present

## 2021-05-29 DIAGNOSIS — M160 Bilateral primary osteoarthritis of hip: Secondary | ICD-10-CM | POA: Diagnosis not present

## 2021-05-29 DIAGNOSIS — I10 Essential (primary) hypertension: Secondary | ICD-10-CM | POA: Diagnosis not present

## 2021-05-29 DIAGNOSIS — E559 Vitamin D deficiency, unspecified: Secondary | ICD-10-CM | POA: Diagnosis not present

## 2021-05-29 DIAGNOSIS — K219 Gastro-esophageal reflux disease without esophagitis: Secondary | ICD-10-CM | POA: Insufficient documentation

## 2021-05-29 DIAGNOSIS — Z96652 Presence of left artificial knee joint: Secondary | ICD-10-CM | POA: Diagnosis not present

## 2021-05-29 DIAGNOSIS — R109 Unspecified abdominal pain: Secondary | ICD-10-CM | POA: Diagnosis not present

## 2021-05-29 DIAGNOSIS — I639 Cerebral infarction, unspecified: Secondary | ICD-10-CM | POA: Diagnosis not present

## 2021-05-29 DIAGNOSIS — Z79899 Other long term (current) drug therapy: Secondary | ICD-10-CM | POA: Insufficient documentation

## 2021-05-29 DIAGNOSIS — J45909 Unspecified asthma, uncomplicated: Secondary | ICD-10-CM | POA: Diagnosis not present

## 2021-05-29 DIAGNOSIS — F17210 Nicotine dependence, cigarettes, uncomplicated: Secondary | ICD-10-CM | POA: Diagnosis not present

## 2021-05-29 DIAGNOSIS — K5903 Drug induced constipation: Secondary | ICD-10-CM | POA: Diagnosis not present

## 2021-05-29 DIAGNOSIS — R200 Anesthesia of skin: Secondary | ICD-10-CM | POA: Diagnosis not present

## 2021-05-29 LAB — CBC WITH DIFFERENTIAL/PLATELET
Abs Immature Granulocytes: 0.01 10*3/uL (ref 0.00–0.07)
Basophils Absolute: 0 10*3/uL (ref 0.0–0.1)
Basophils Relative: 1 %
Eosinophils Absolute: 0.2 10*3/uL (ref 0.0–0.5)
Eosinophils Relative: 2 %
HCT: 31.7 % — ABNORMAL LOW (ref 36.0–46.0)
Hemoglobin: 10.7 g/dL — ABNORMAL LOW (ref 12.0–15.0)
Immature Granulocytes: 0 %
Lymphocytes Relative: 22 %
Lymphs Abs: 1.4 10*3/uL (ref 0.7–4.0)
MCH: 32.6 pg (ref 26.0–34.0)
MCHC: 33.8 g/dL (ref 30.0–36.0)
MCV: 96.6 fL (ref 80.0–100.0)
Monocytes Absolute: 0.5 10*3/uL (ref 0.1–1.0)
Monocytes Relative: 8 %
Neutro Abs: 4.2 10*3/uL (ref 1.7–7.7)
Neutrophils Relative %: 67 %
Platelets: 220 10*3/uL (ref 150–400)
RBC: 3.28 MIL/uL — ABNORMAL LOW (ref 3.87–5.11)
RDW: 14.4 % (ref 11.5–15.5)
WBC: 6.3 10*3/uL (ref 4.0–10.5)
nRBC: 0 % (ref 0.0–0.2)

## 2021-05-29 LAB — COMPREHENSIVE METABOLIC PANEL
ALT: 37 U/L (ref 0–44)
AST: 42 U/L — ABNORMAL HIGH (ref 15–41)
Albumin: 3.3 g/dL — ABNORMAL LOW (ref 3.5–5.0)
Alkaline Phosphatase: 90 U/L (ref 38–126)
Anion gap: 7 (ref 5–15)
BUN: 5 mg/dL — ABNORMAL LOW (ref 8–23)
CO2: 19 mmol/L — ABNORMAL LOW (ref 22–32)
Calcium: 10 mg/dL (ref 8.9–10.3)
Chloride: 110 mmol/L (ref 98–111)
Creatinine, Ser: 0.73 mg/dL (ref 0.44–1.00)
GFR, Estimated: >60 mL/min (ref >60)
Glucose, Bld: 87 mg/dL (ref 70–99)
Potassium: 3.1 mmol/L — ABNORMAL LOW (ref 3.5–5.1)
Sodium: 136 mmol/L (ref 135–145)
Total Bilirubin: 0.4 mg/dL (ref 0.3–1.2)
Total Protein: 7 g/dL (ref 6.5–8.1)

## 2021-05-29 LAB — MAGNESIUM: Magnesium: 2.1 mg/dL (ref 1.7–2.4)

## 2021-05-29 LAB — LIPASE, BLOOD: Lipase: 22 U/L (ref 11–51)

## 2021-05-29 MED ORDER — POTASSIUM CHLORIDE CRYS ER 20 MEQ PO TBCR
40.0000 meq | EXTENDED_RELEASE_TABLET | Freq: Once | ORAL | Status: AC
  Administered 2021-05-29: 40 meq via ORAL
  Filled 2021-05-29: qty 2

## 2021-05-29 MED ORDER — SODIUM CHLORIDE 0.9 % IV BOLUS
500.0000 mL | Freq: Once | INTRAVENOUS | Status: AC
  Administered 2021-05-29: 500 mL via INTRAVENOUS

## 2021-05-29 MED ORDER — POLYETHYLENE GLYCOL 3350 17 G PO PACK
17.0000 g | PACK | Freq: Once | ORAL | Status: AC
  Administered 2021-05-29: 17 g via ORAL
  Filled 2021-05-29: qty 1

## 2021-05-29 MED ORDER — POTASSIUM CHLORIDE 10 MEQ/100ML IV SOLN: 10.0000 meq | INTRAVENOUS | Status: DC

## 2021-05-29 MED ORDER — ONDANSETRON HCL 4 MG/2ML IJ SOLN
4.0000 mg | Freq: Once | INTRAMUSCULAR | Status: AC
  Administered 2021-05-29: 4 mg via INTRAVENOUS
  Filled 2021-05-29: qty 2

## 2021-05-29 MED ORDER — LACTULOSE 10 GM/15ML PO SOLN
20.0000 g | Freq: Two times a day (BID) | ORAL | 0 refills | Status: DC | PRN
Start: 2021-05-29 — End: 2021-07-16

## 2021-05-29 MED ORDER — FENTANYL CITRATE PF 50 MCG/ML IJ SOSY
100.0000 ug | PREFILLED_SYRINGE | INTRAMUSCULAR | Status: DC | PRN
  Administered 2021-05-29 (×2): 100 ug via INTRAMUSCULAR
  Filled 2021-05-29 (×2): qty 2

## 2021-05-29 MED ORDER — LACTULOSE 10 GM/15ML PO SOLN
20.0000 g | Freq: Once | ORAL | Status: AC
  Administered 2021-05-29: 20 g via ORAL
  Filled 2021-05-29: qty 30

## 2021-05-29 MED ORDER — FENTANYL CITRATE PF 50 MCG/ML IJ SOSY
100.0000 ug | PREFILLED_SYRINGE | INTRAMUSCULAR | Status: DC | PRN
  Administered 2021-05-29: 100 ug via INTRAVENOUS
  Filled 2021-05-29: qty 2

## 2021-05-29 NOTE — ED Notes (Signed)
ED Provider at bedside. 

## 2021-05-29 NOTE — ED Triage Notes (Signed)
Left sided weakness onset 2 days ago

## 2021-05-29 NOTE — ED Provider Notes (Signed)
[facility] EMERGENCY DEPARTMENT Provider Note   CSN: [id number] Arrival date & time: 05/29/21  1343     History Chief Complaint  Patient presents with   Numbness    Colleen Lee is a 65 y.o. female.  HPI She presents for her initial stated concern of a numb sensation in her left arm which makes it difficult to move.  She has noticed this for 2 days.  She is also concerned that her abdomen hurts and she feels like she has pancreatitis again.  She was recently hospitalized for pancreatitis, acute on chronic.  She has chronic recurrent abdominal pain.  She is specifically requesting pain medicine at this time.  She has chronic pain and receives 90 hydrocodone 5 mg tablets every month.  She states she has not had a bowel movement for 2 days and feels like she is constipated.  She denies weakness, dizziness, headache, back pain, blurred vision or syncope.  There are no other known active modifying factors.    Past Medical History:  Diagnosis Date   Asthma    Chronic abdominal pain    Chronic back pain    Chronic constipation    Cirrhosis (HCC)    Metavir score F4 on elastography 2015   Cyclical vomiting    Fibroids    Fibromyalgia    GERD (gastroesophageal reflux disease)    H. pylori infection 2014   treated with pylera, had to be treated again as it was not eradicated. Urea breath test then negative after subsequent treatment.    [name]    HCV RNA positive 09/2012   Hypertension    Marijuana use    Nausea and vomiting    chronic, recurrent   PONV (postoperative nausea and vomiting)    pt thinks maybe once she had N&V   Sciatica of left side     Patient Active Problem List   Diagnosis Date Noted   Pancreatitis, recurrent 05/17/2021   Dysuria 09/16/2020   Body mass index (BMI) 31.0-31.9, adult 01/07/2020   Osteoarthritis of spine with radiculopathy, 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   Acute pancreatitis 10/17/2019   ETOH abuse 10/17/2019   S/P right rotator cuff  repair 01/30/19 02/06/2019   Nontraumatic complete tear of right rotator cuff    Arthritis of right acromioclavicular joint    S/P total knee replacement, 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/24/2018   Shoulder impingement, right 05/24/2018   Dehydration    Intractable cyclical vomiting 01/28/2018   Pyloric stenosis in adult    Thrombocytopenia (HCC) 12/20/2017   Leukocytosis    Malnutrition of moderate degree 12/17/2017   Acute renal failure (ARF) (HCC) 12/15/2017   Chronic abdominal pain 12/15/2017   Gastroenteritis 06/14/2016   Nausea with vomiting 06/10/2016   Uterine enlargement 06/09/2016   Intractable nausea and vomiting 06/08/2016   Acute infective gastroenteritis 06/08/2016   Diarrhea 06/08/2016   Essential hypertension 06/08/2016   GERD (gastroesophageal reflux disease) 06/08/2016   Abdominal pain    Endometrial polyp 04/15/2016   PMB (postmenopausal bleeding) 02/27/2016   Alcoholic cirrhosis of liver without ascites (HCC)    Asthma 01/21/2016   Hepatic cirrhosis (HCC) 09/22/2015   Arthritis of knee, degenerative 02/05/2015   History of Helicobacter pylori infection 10/30/2014   Lumbago with sciatica 07/01/2014   Chronic [name] with cirrhosis (HCC) 04/11/2014   De Quervain's disease (radial styloid tenosynovitis) 12/25/2013   Anorexia 11/21/2012   FH: colon cancer 11/21/2012   Early satiety 10/25/2012   Bowel habit changes 10/25/2012  Abdominal pain, epigastric 10/25/2012   Abdominal bloating 10/25/2012   Constipation 10/25/2012   Abnormal weight loss 10/25/2012   Chronic viral [name] ([place]) 10/25/2012   Radicular pain of left lower extremity 09/28/2012   Back pain 09/28/2012   Sciatica 08/10/2011   S/P arthroscopy of left knee 08/10/2011   Tibial plateau fracture 08/10/2011   Pain in joint, lower leg 02/12/2011   Stiffness of joint, not elsewhere classified, lower leg 02/12/2011   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  Long redundant colon, a diminutive sigmoid polyp status post cold biopsy removed. Hyperplastic polyp. Repeat colonoscopy June 2014 due to family history of colon cancer   COLONOSCOPY WITH ESOPHAGOGASTRODUODENOSCOPY (EGD) N/A 11/02/2012   LGX:QJJHERDE gastric mucosa of doubtful, +H.pylori. Incomplete colonoscopy due to patient unable to tolerate exam, proximal colon seen. Patient refused ACBE.   COLONOSCOPY WITH PROPOFOL N/A 03/08/2013   Dr. Gala Romney: colonic polyp-removed as scribed above. Internal Hemorrhoids. Pathology did not reveal any colonic tissue, only mucus. SURVEILLANCE DUE Aug 2019   COLONOSCOPY WITH PROPOFOL N/A 01/19/2018   Dr. Gala Romney: 6 mm cecal inflammatoy polyp, otherwise normal., Surveilance in 5 year s   ESOPHAGEAL DILATION  12/20/2017   Procedure: ESOPHAGEAL DILATION;  Surgeon: Danie Binder, MD;  [facility]: [facility];  Service: Endoscopy;;   ESOPHAGOGASTRODUODENOSCOPY  01/18/2008   RMR: Normal esophagus, normal  stomach   ESOPHAGOGASTRODUODENOSCOPY (EGD) WITH PROPOFOL N/A 02/09/2016   Normal esophagus, small hiatal hernia, portal hypertensive gastropathy, normal second portion of duodenum. Due in July 2019   ESOPHAGOGASTRODUODENOSCOPY (EGD) WITH PROPOFOL N/A 12/20/2017   Dr. Oneida Alar: benign appearing esophageal stenosis s/p dilation, mild pyloric stenosis s/p biopsy and dilation, mild duodenitis.    ESOPHAGOGASTRODUODENOSCOPY (EGD) WITH PROPOFOL N/A 06/12/2020   Rourk: Normal esophagus, portal hypertensive gastropathy, no specimens  collected.  Tentatively plan for repeat EGD in 2 years.   HYSTEROSCOPY  05/11/2016   Procedure: HYSTEROSCOPY;  Surgeon: Jonnie Kind, MD;  Location: [facility];  Service: Gynecology;;   KNEE SURGERY     left knee   MULTIPLE EXTRACTIONS WITH ALVEOLOPLASTY N/A 10/15/2013   Procedure: MULTIPLE EXTRACTION WITH ALVEOLOPLASTY;  Surgeon: Gae Bon, DDS;  Location: [place];  Service: Oral Surgery;  Laterality: N/A;   POLYPECTOMY N/A 03/08/2013   Procedure: POLYPECTOMY;  Surgeon: Daneil Dolin, MD;  Location: [facility];  Service: Endoscopy;  Laterality: N/A;   POLYPECTOMY N/A 05/11/2016   Procedure: REMOVAL OF ENDOMETRIAL POLYP;  Surgeon: Jonnie Kind, MD;  Location: [facility];  Service: Gynecology;  Laterality: N/A;   RESECTION DISTAL CLAVICAL Right 01/30/2019   Procedure: RESECTION DISTAL CLAVICAL;  Surgeon: Carole Civil, MD;  Location: [facility];  Service: Orthopedics;  Laterality: Right;   SHOULDER OPEN ROTATOR CUFF REPAIR Right 01/30/2019   Procedure: ROTATOR CUFF REPAIR SHOULDER OPEN;  Surgeon: Carole Civil, MD;  Location: [facility];  Service: Orthopedics;  Laterality: Right;  pt to arrive at 7:30 for PICC at 8:00   TOTAL  KNEE ARTHROPLASTY Left 02/05/2015   Procedure: LEFT TOTAL KNEE ARTHROPLASTY;  Surgeon: Carole Civil, MD;  Location: [facility];  Service: Orthopedics;  Laterality: Left;     OB History     Gravida      Para      Term      Preterm      AB      Living  1      SAB      IAB      Ectopic      Multiple      Live Births              Family History  Problem Relation Age of Onset   Colon cancer Brother 73           Multiple myeloma Brother    Liver cancer Sister    Prostate cancer Brother    Pancreatic cancer Brother    Cancer Mother        breast   Asthma Mother     Social History   Tobacco Use   Smoking status: Every Day    Packs/day: 1.00    Years: 40.00    Pack years: 40.00    Types: Cigarettes   Smokeless tobacco: Never   Tobacco  comments:    Smokes one pack of cigarettes every 1.5 days  Vaping Use   Vaping Use: Never used  Substance Use Topics   Alcohol use: Yes    Alcohol/week: 0.0 standard drinks    Comment: 1 bottle of dinner wine daily   Drug use: Not Currently    Types: Marijuana    Comment: history of marijuana in past- almost 2 years ago    Home Medications Prior to Admission medications   Medication Sig Start Date End Date Taking? Authorizing Provider  lactulose (CHRONULAC) 10 GM/15ML solution Take 30 mLs (20 g total) by mouth 2 (two) times daily as needed for mild constipation or moderate constipation. 05/29/21  Yes Daleen Bo, MD  albuterol (PROVENTIL HFA;VENTOLIN HFA) 108 (90 Base) MCG/ACT inhaler Inhale 2 puffs into the lungs every 6 (six) hours as needed for wheezing or shortness of breath.     [provider]  allopurinol (ZYLOPRIM) 300 MG tablet Take 300 mg by mouth daily. 04/15/21   [provider]  cyclobenzaprine (FLEXERIL) 5 MG tablet Take 5 mg by mouth 3 (three) times daily. 04/15/21   [provider]  folic acid (FOLVITE) 1 MG tablet Take 1 tablet (1 mg total) by mouth daily. 01/06/21   Barton Dubois, MD  gabapentin (NEURONTIN) 100 MG capsule Take 1 capsule (100 mg total) by mouth 3 (three) times daily. Patient taking differently: Take 100 mg by mouth 2 (two) times daily. Takes 1 tablet in the morning and 1 tablet in the evening 12/22/18   Carole Civil, MD  hydrALAZINE (APRESOLINE) 50 MG tablet Take 1 tablet (50 mg total) by mouth every 8 (eight) hours. 01/30/18   Barton Dubois, MD  HYDROcodone-acetaminophen (NORCO/VICODIN) 5-325 MG tablet Take 1 tablet by mouth 3 (three) times daily as needed for pain. 12/04/20   [provider]  HYDROmorphone (DILAUDID) 4 MG tablet Take 1 tablet (4 mg total) by mouth every 6 (six) hours as needed for severe pain. 05/11/21   Milton Ferguson, MD  hydrOXYzine (ATARAX/VISTARIL) 10 MG tablet Take 10 mg by mouth daily.  11/30/17    [provider]  labetalol (NORMODYNE) 100 MG tablet Take 2 tablets (200 mg total)  by mouth 2 (two) times daily. Patient taking differently: Take 100 mg by mouth in the morning. 01/30/18   Barton Dubois, MD  megestrol (MEGACE) 40 MG tablet Take 40 mg by mouth daily. 04/02/21   [provider]  naloxone [facility]) nasal spray 4 mg/0.1 mL See admin instructions. ADMINISTER A SINGLE SPRAYEOF NARCAN INTO ONECNOSTRIL. REPEAT AFTER 3SMINUTES IN OTHER NOSTRIL IF NO RESPONSE. 10/03/20   [provider]  ondansetron (ZOFRAN ODT) 4 MG disintegrating tablet 4mg  ODT q4 hours prn nausea/vomit 05/11/21   Milton Ferguson, MD  pantoprazole (PROTONIX) 40 MG tablet TAKE (1) TABLET BY MOUTH DAILY 30 MINUTES BEFORE BREAKFAST. Patient taking differently: Take 40 mg by mouth daily. 10/27/20   Erenest Rasher, PA-C  polyethylene glycol (MIRALAX) 17 g packet Take 17 g by mouth daily as needed for mild constipation. 01/05/21   Barton Dubois, MD  thiamine 100 MG tablet Take 1 tablet (100 mg total) by mouth daily. Patient not taking: Reported on 05/18/2021 01/06/21   Barton Dubois, MD  TRULANCE 3 MG TABS Take 3 mg by mouth daily. Patient not taking: Reported on 05/18/2021 01/01/21   [provider]    Allergies    Penicillins  Review of Systems   Review of Systems  All other systems reviewed and are negative.  Physical Exam Updated Vital Signs BP (!) 160/90   Pulse 90   Temp 99 F (37.2 C) (Oral)   Resp 20   SpO2 100%   Physical Exam Vitals and nursing note reviewed.  Constitutional:      Appearance: She is well-developed. She is not ill-appearing.  HENT:     Head: Normocephalic and atraumatic.     Right Ear: External ear normal.     Left Ear: External ear normal.     Nose: No congestion or rhinorrhea.  Eyes:     Conjunctiva/sclera: Conjunctivae normal.     Pupils: Pupils are equal, round, and reactive to light.  Neck:     Trachea: Phonation normal.  Cardiovascular:      Rate and Rhythm: Normal rate and regular rhythm.     Heart sounds: Normal heart sounds.  Pulmonary:     Effort: Pulmonary effort is normal.     Breath sounds: Normal breath sounds.  Abdominal:     General: There is no distension.     Palpations: Abdomen is soft.     Tenderness: There is abdominal tenderness (Diffuse, mild).  Musculoskeletal:        General: No swelling or tenderness. Normal range of motion.     Cervical back: Normal range of motion and neck supple.  Skin:    General: Skin is warm and dry.  Neurological:     Mental Status: She is alert and oriented to person, place, and time.     Cranial Nerves: No cranial nerve deficit.     Sensory: No sensory deficit.     Motor: No abnormal muscle tone.     Coordination: Coordination normal.  Psychiatric:        Mood and Affect: Mood normal.        Behavior: Behavior normal.        Thought Content: Thought content normal.        Judgment: Judgment normal.    ED Results / Procedures / Treatments   Labs (all labs ordered are listed, but only abnormal results are displayed) Labs Reviewed  COMPREHENSIVE METABOLIC PANEL - Abnormal; Notable for the following components:  Result Value   Potassium 3.1 (*)    CO2 19 (*)    BUN 5 (*)    Albumin 3.3 (*)    AST 42 (*)    All other components within normal limits  CBC WITH DIFFERENTIAL/PLATELET - Abnormal; Notable for the following components:   RBC 3.28 (*)    Hemoglobin 10.7 (*)    HCT 31.7 (*)    All other components within normal limits  LIPASE, BLOOD  MAGNESIUM  RAPID URINE DRUG SCREEN, HOSP PERFORMED    EKG None  Radiology DG ABD ACUTE 2+V W 1V CHEST  Result Date: 05/29/2021 CLINICAL DATA:  Pain.  Constipation. EXAM: DG ABDOMEN ACUTE WITH 1 VIEW CHEST COMPARISON:  Most recent abdominopelvic CT 05/11/2021 FINDINGS: Normal heart size. Normal mediastinal contours. No focal airspace disease, pleural effusion or pneumothorax. Bronchial and interstitial thickening  consistent with history of smoking. No free intra-abdominal air. No bowel dilatation to suggest obstruction. No small bowel dilatation in the central abdomen to suggest irritation from pancreatic inflammation is seen on prior CT. Moderate stool in the transverse and rectosigmoid colon. No abnormal rectal distention. No radiopaque calculi. Degenerative change of both hips. The bones are diffusely under mineralized. IMPRESSION: 1. Moderate stool in the transverse and rectosigmoid colon. No bowel obstruction or free air. 2. Bronchial and interstitial thickening consistent with history of smoking. Electronically Signed   By: Keith Rake M.D.   On: 05/29/2021 17:00    Procedures Procedures   Medications Ordered in ED Medications  fentaNYL (SUBLIMAZE) injection 100 mcg (100 mcg Intramuscular Given 05/29/21 2203)  sodium chloride 0.9 % bolus 500 mL (0 mLs Intravenous Stopped 05/29/21 1645)  ondansetron (ZOFRAN) injection 4 mg (4 mg Intravenous Given 05/29/21 1619)  polyethylene glycol (MIRALAX / GLYCOLAX) packet 17 g (17 g Oral Given 05/29/21 1828)  potassium chloride SA (KLOR-CON) CR tablet 40 mEq (40 mEq Oral Given 05/29/21 1828)  lactulose (CHRONULAC) 10 GM/15ML solution 20 g (20 g Oral Given 05/29/21 2203)    ED Course  I have reviewed the triage vital signs and the nursing notes.  Pertinent labs & imaging results that were available during my care of the patient were reviewed by me and considered in my medical decision making (see chart for details).    MDM Rules/Calculators/A&P                            Patient Vitals for the past 24 hrs:  BP Temp Temp src Pulse Resp SpO2  05/29/21 2159 (!) 160/90 99 F (37.2 C) Oral 90 20 100 %  05/29/21 2000 (!) 165/93 -- -- 94 18 100 %  05/29/21 1900 139/90 -- -- 94 18 100 %  05/29/21 1735 (!) 114/93 -- -- 62 18 100 %  05/29/21 1715 (!) 158/99 -- -- -- -- --  05/29/21 1630 107/88 -- -- (!) 51 18 100 %  05/29/21 1615 (!) 145/97 -- -- (!) 103  18 100 %  05/29/21 1359 (!) 146/86 99.1 F (37.3 C) Oral (!) 113 18 91 %    9:56 PM Reevaluation with update and discussion. After initial assessment and treatment, an updated evaluation reveals she continues to complain of abdominal pain but states she has had a small bowel movement after taking MiraLAX.  She now states she takes MiraLAX daily.  We will give a dose of lactulose prior to discharge. Daleen Bo   Medical Decision Making:  This patient is presenting  for evaluation of recurrent abdominal pain and numbness of the left hand, which does require a range of treatment options, and is a complaint that involves a moderate risk of morbidity and mortality. The differential diagnoses include constipation, pancreatitis, complications of alcohol abuse. I decided to review old records, and in summary she is debilitated, history of [name] and cirrhosis, chronic abdominal pain, alcohol abuse, GERD, and recurrent pancreatitis.  I obtained additional historical information from her significant other at the bedside.  Clinical Laboratory Tests Ordered, included CBC, Metabolic panel, and UDS, lipase . Review indicates normal except potassium low, CO2 low, BUN low, albumin low, AST high, hemoglobin low. Radiologic Tests Ordered, included acute abdominal series.  I independently Visualized: Radiograph images, which show constipation    Critical Interventions-clinical evaluation, laboratory testing, medication treatment, radiography, observation and reassessment  After These Interventions, the Patient was reevaluated and was found stable for discharge.  Abdominal pain most likely related to constipation.  We will add lactulose to treatment plan and encourage fiber and fluids.  Doubt acute pancreatitis.  Patient has chronic pain.  She is on chronic narcotics.  CRITICAL CARE-no Performed by: Daleen Bo  Nursing Notes Reviewed/ Care Coordinated Applicable Imaging Reviewed Interpretation of  Laboratory Data incorporated into ED treatment  The patient appears reasonably screened and/or stabilized for discharge and I doubt any other medical condition or other [facility] requiring further screening, evaluation, or treatment in the ED at this time prior to discharge.  Plan: Home Medications-continue; Home Treatments-gradually advance diet and activity, high-fiber diet; return here if the recommended treatment, does not improve the symptoms; Recommended follow up-PCP, as needed     Final Clinical Impression(s) / ED Diagnoses Final diagnoses:  Drug-induced constipation    Rx / DC Orders ED Discharge Orders          Ordered    lactulose (CHRONULAC) 10 GM/15ML solution  2 times daily PRN        05/29/21 2159             Daleen Bo, MD 05/30/21 [phone]

## 2021-05-29 NOTE — Discharge Instructions (Signed)
Your abdominal pain is likely from constipation.  Try to increase amount of fiber in your diet.  Drink 2 L of water every day to improve your stooling.  Follow-up with your primary care doctor for problems.  We sent a prescription to your pharmacy.

## 2021-05-29 NOTE — ED Notes (Signed)
Pt calling out from room with multiple requests for more pain medication. Pt reporting pain on LUQ still present \

## 2021-05-29 NOTE — ED Notes (Signed)
Pt c/o left arm numbness since yesterday. She reports she last felt normal on Wednesday. Pt reports the numbness in only in her left arm, not her face or leg. If she shakes her hand, it makes the numbness go away. Pt also c/o abdominal pain. She says this isn't what brought her here today but it is hurting due to "pancreatitis" and is asking for pain medication. She was recently admitted for same and says her abdomen has been hurting since she was discharged.

## 2021-05-31 ENCOUNTER — Encounter (HOSPITAL_COMMUNITY): Payer: Self-pay

## 2021-05-31 ENCOUNTER — Emergency Department (HOSPITAL_COMMUNITY): Payer: Medicare Other

## 2021-05-31 ENCOUNTER — Emergency Department (HOSPITAL_COMMUNITY)
Admission: EM | Admit: 2021-05-31 | Discharge: 2021-06-01 | Disposition: A | Discharge status: Home / Self Care | Payer: Medicare Other | Attending: Emergency Medicine | Admitting: Emergency Medicine

## 2021-05-31 DIAGNOSIS — Z79899 Other long term (current) drug therapy: Secondary | ICD-10-CM | POA: Insufficient documentation

## 2021-05-31 DIAGNOSIS — J45909 Unspecified asthma, uncomplicated: Secondary | ICD-10-CM | POA: Insufficient documentation

## 2021-05-31 DIAGNOSIS — F17210 Nicotine dependence, cigarettes, uncomplicated: Secondary | ICD-10-CM | POA: Diagnosis not present

## 2021-05-31 DIAGNOSIS — R1032 Left lower quadrant pain: Secondary | ICD-10-CM | POA: Diagnosis not present

## 2021-05-31 DIAGNOSIS — Z96652 Presence of left artificial knee joint: Secondary | ICD-10-CM | POA: Insufficient documentation

## 2021-05-31 DIAGNOSIS — K5900 Constipation, unspecified: Secondary | ICD-10-CM | POA: Diagnosis not present

## 2021-05-31 DIAGNOSIS — R109 Unspecified abdominal pain: Secondary | ICD-10-CM | POA: Diagnosis present

## 2021-05-31 DIAGNOSIS — K6289 Other specified diseases of anus and rectum: Secondary | ICD-10-CM | POA: Diagnosis not present

## 2021-05-31 DIAGNOSIS — I10 Essential (primary) hypertension: Secondary | ICD-10-CM | POA: Insufficient documentation

## 2021-05-31 DIAGNOSIS — K5903 Drug induced constipation: Secondary | ICD-10-CM | POA: Insufficient documentation

## 2021-05-31 LAB — BASIC METABOLIC PANEL
Anion gap: 8 (ref 5–15)
BUN: <5 mg/dL — ABNORMAL LOW (ref 8–23)
CO2: 19 mmol/L — ABNORMAL LOW (ref 22–32)
Calcium: 10.1 mg/dL (ref 8.9–10.3)
Chloride: 106 mmol/L (ref 98–111)
Creatinine, Ser: 0.76 mg/dL (ref 0.44–1.00)
GFR, Estimated: >60 mL/min (ref >60)
Glucose, Bld: 141 mg/dL — ABNORMAL HIGH (ref 70–99)
Potassium: 3.7 mmol/L (ref 3.5–5.1)
Sodium: 133 mmol/L — ABNORMAL LOW (ref 135–145)

## 2021-05-31 LAB — HEPATIC FUNCTION PANEL
ALT: 33 U/L (ref 0–44)
AST: 38 U/L (ref 15–41)
Albumin: 3.1 g/dL — ABNORMAL LOW (ref 3.5–5.0)
Alkaline Phosphatase: 90 U/L (ref 38–126)
Bilirubin, Direct: 0.3 mg/dL — ABNORMAL HIGH (ref 0.0–0.2)
Indirect Bilirubin: 0.3 mg/dL (ref 0.3–0.9)
Total Bilirubin: 0.6 mg/dL (ref 0.3–1.2)
Total Protein: 6.8 g/dL (ref 6.5–8.1)

## 2021-05-31 LAB — CBC WITH DIFFERENTIAL/PLATELET
Abs Immature Granulocytes: 0.02 10*3/uL (ref 0.00–0.07)
Basophils Absolute: 0 10*3/uL (ref 0.0–0.1)
Basophils Relative: 0 %
Eosinophils Absolute: 0.2 10*3/uL (ref 0.0–0.5)
Eosinophils Relative: 2 %
HCT: 31.9 % — ABNORMAL LOW (ref 36.0–46.0)
Hemoglobin: 10.8 g/dL — ABNORMAL LOW (ref 12.0–15.0)
Immature Granulocytes: 0 %
Lymphocytes Relative: 17 %
Lymphs Abs: 1.5 10*3/uL (ref 0.7–4.0)
MCH: 33 pg (ref 26.0–34.0)
MCHC: 33.9 g/dL (ref 30.0–36.0)
MCV: 97.6 fL (ref 80.0–100.0)
Monocytes Absolute: 0.5 10*3/uL (ref 0.1–1.0)
Monocytes Relative: 5 %
Neutro Abs: 6.7 10*3/uL (ref 1.7–7.7)
Neutrophils Relative %: 76 %
Platelets: 287 10*3/uL (ref 150–400)
RBC: 3.27 MIL/uL — ABNORMAL LOW (ref 3.87–5.11)
RDW: 14.5 % (ref 11.5–15.5)
WBC: 9 10*3/uL (ref 4.0–10.5)
nRBC: 0 % (ref 0.0–0.2)

## 2021-05-31 LAB — LIPASE, BLOOD: Lipase: 22 U/L (ref 11–51)

## 2021-05-31 MED ORDER — BISACODYL 10 MG RE SUPP
10.0000 mg | Freq: Once | RECTAL | Status: AC
  Administered 2021-05-31: 10 mg via RECTAL
  Filled 2021-05-31: qty 1

## 2021-05-31 MED ORDER — LABETALOL HCL 200 MG PO TABS
200.0000 mg | ORAL_TABLET | Freq: Once | ORAL | Status: AC
  Administered 2021-05-31: 200 mg via ORAL
  Filled 2021-05-31: qty 1

## 2021-05-31 MED ORDER — ONDANSETRON 4 MG PO TBDP
4.0000 mg | ORAL_TABLET | Freq: Once | ORAL | Status: AC
  Administered 2021-05-31: 4 mg via ORAL
  Filled 2021-05-31: qty 1

## 2021-05-31 MED ORDER — FLEET ENEMA 7-19 GM/118ML RE ENEM
1.0000 | ENEMA | Freq: Once | RECTAL | Status: AC
  Administered 2021-05-31: 1 via RECTAL

## 2021-05-31 MED ORDER — BISACODYL 5 MG PO TBEC
5.0000 mg | DELAYED_RELEASE_TABLET | Freq: Once | ORAL | Status: AC
  Administered 2021-05-31: 5 mg via ORAL
  Filled 2021-05-31: qty 1

## 2021-05-31 MED ORDER — PEG 3350-KCL-NABCB-NACL-NASULF 236 G PO SOLR: ORAL | 0 refills | Status: DC

## 2021-05-31 MED ORDER — OXYCODONE-ACETAMINOPHEN 5-325 MG PO TABS
1.0000 | ORAL_TABLET | Freq: Once | ORAL | Status: AC
Start: 2021-05-31 — End: 2021-05-31
  Administered 2021-05-31: 1 via ORAL
  Filled 2021-05-31: qty 1

## 2021-05-31 MED ORDER — MORPHINE SULFATE (PF) 4 MG/ML IV SOLN
4.0000 mg | Freq: Once | INTRAVENOUS | Status: AC
  Administered 2021-05-31: 4 mg via INTRAMUSCULAR

## 2021-05-31 MED ORDER — SODIUM CHLORIDE 0.9 % IV BOLUS: 500.0000 mL | Freq: Once | INTRAVENOUS | Status: DC

## 2021-05-31 MED ORDER — ONDANSETRON HCL 4 MG/2ML IJ SOLN
4.0000 mg | Freq: Once | INTRAMUSCULAR | Status: DC
  Filled 2021-05-31: qty 2

## 2021-05-31 MED ORDER — MORPHINE SULFATE (PF) 4 MG/ML IV SOLN
4.0000 mg | Freq: Once | INTRAVENOUS | Status: DC
  Filled 2021-05-31: qty 1

## 2021-05-31 NOTE — ED Notes (Signed)
PIV attempt x multiple Rns. Unsuccessful. PA notified.

## 2021-05-31 NOTE — ED Notes (Signed)
Failed IV attempt. Notified primary RN

## 2021-05-31 NOTE — ED Provider Notes (Signed)
Emergency Medicine Provider Triage Evaluation Note  Colleen Lee , a 65 y.o. female  was evaluated in triage.  Pt complains of constipation.  Reports she has not had a bowel movement in a long time.  Was seen for the same 2 days ago.  Has been taking lactulose since then.  Feels as though some stool is loosening up however she was unable to disimpact herself, no are with her husband.  He reports seeing some blood at that time.  She still passes gas.  On chronic opioids  Review of Systems  Positive: Constipation Negative: Nausea and vomiting  Physical Exam  BP (!) 128/96 (BP Location: Left Arm)   Pulse (!) 131   Temp 98.3 F (36.8 C) (Oral)   Resp 16   Ht 5\' 6"  (1.676 m)   Wt 76.7 kg   SpO2 95%   BMI 27.28 kg/m  Gen:   Awake, no distress   Resp:  Normal effort  MSK:   Moves extremities without difficulty  Other:    Medical Decision Making  Medically screening exam initiated at 2:31 PM.  Appropriate orders placed.  [name] was informed that the remainder of the evaluation will be completed by another provider, this initial triage assessment does not replace that evaluation, and the importance of remaining in the ED until their evaluation is complete.     Rhae Hammock, PA-C 05/31/21 [place], [place], MD 05/31/21 [phone]

## 2021-05-31 NOTE — ED Notes (Signed)
Pt states she was just here on Friday and attempted stool softeners given in ED, states that she felt like liquid stool came out around hardened stool still impacted in rectum. Dulcolax given and pt placed on cardiac monitoring. Resting at this time

## 2021-05-31 NOTE — ED Provider Notes (Signed)
[facility] EMERGENCY DEPARTMENT Provider Note   CSN: [id number] Arrival date & time: 05/31/21  1343     History Chief Complaint  Patient presents with   Constipation    Colleen Lee is a 65 y.o. female with a history including asthma, chronic abdominal pain and constipation, history of cirrhosis, GERD, [name], hypertension, history of polysubstance abuse currently on chronic hydrocodone presenting for evaluation of constipation.  She states she has not had a normal bowel movement" a long time", and states her last bowel movement was 2 days ago, very small and did not relieve her rectal pain and pressure.  She was seen here 2 days ago and was prescribed lactulose, she has taken this medication without relief of symptoms.  She chronically takes miralax. She and her brother have both attempted a digital disimpaction which was not helpful.  She does have abdominal distention but she states this is her baseline, she does not have abdominal pain, her complaints are all for rectal pain and pressure.  Denies fevers or chills, no nausea or vomiting.  She is passing flatus.  She believes she needs an enema.  The history is provided by the patient.      Past Medical History:  Diagnosis Date   Asthma    Chronic abdominal pain    Chronic back pain    Chronic constipation    Cirrhosis (HCC)    Metavir score F4 on elastography 0092   Cyclical vomiting    Fibroids    Fibromyalgia    GERD (gastroesophageal reflux disease)    H. pylori infection 2014   treated with pylera, had to be treated again as it was not eradicated. Urea breath test then negative after subsequent treatment.    [name]    HCV RNA positive 09/2012   Hypertension    Marijuana use    Nausea and vomiting    chronic, recurrent   PONV (postoperative nausea and vomiting)    pt thinks maybe once she had N&V   Sciatica of left side     Patient Active Problem List   Diagnosis Date Noted   Pancreatitis, recurrent  05/17/2021   Dysuria 09/16/2020   Body mass index (BMI) 31.0-31.9, adult 01/07/2020   Osteoarthritis of spine with radiculopathy, 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   Acute pancreatitis 10/17/2019   ETOH abuse 10/17/2019   S/P right rotator cuff repair 01/30/19 02/06/2019   Nontraumatic complete tear of right rotator cuff    Arthritis of right acromioclavicular joint    S/P total knee replacement, 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/24/2018   Shoulder impingement, right 05/24/2018   Dehydration    Intractable cyclical vomiting 33/00/7622   Pyloric stenosis in adult    Thrombocytopenia ([place]) 12/20/2017   Leukocytosis    Malnutrition of moderate degree 12/17/2017   Acute renal failure (ARF) ([place]) 12/15/2017   Chronic abdominal pain 12/15/2017   Gastroenteritis 06/14/2016   Nausea with vomiting 06/10/2016   Uterine enlargement 06/09/2016   Intractable nausea and vomiting 06/08/2016   Acute infective gastroenteritis 06/08/2016   Diarrhea 06/08/2016   Essential hypertension 06/08/2016   GERD (gastroesophageal reflux disease) 06/08/2016   Abdominal pain    Endometrial polyp 04/15/2016   PMB (postmenopausal bleeding) 63/33/5456   Alcoholic cirrhosis of liver without ascites ([place])    Asthma 01/21/2016   Hepatic cirrhosis ([place]) 09/22/2015   Arthritis of knee, degenerative 25/63/8937   History of Helicobacter pylori infection 10/30/2014   Lumbago with sciatica 07/01/2014   Chronic [name] with  cirrhosis (HCC) 04/11/2014   De Quervain's disease (radial styloid tenosynovitis) 12/25/2013   Anorexia 11/21/2012   FH: colon cancer 11/21/2012   Early satiety 10/25/2012   Bowel habit changes 10/25/2012   Abdominal pain, epigastric 10/25/2012   Abdominal bloating 10/25/2012   Constipation 10/25/2012   Abnormal weight loss 10/25/2012   Chronic viral [name] (HCC) 10/25/2012   Radicular pain of left lower extremity 09/28/2012   Back pain 09/28/2012   Sciatica 08/10/2011   S/P arthroscopy of left knee  08/10/2011   Tibial plateau fracture 08/10/2011   Pain in joint, lower leg 02/12/2011   Stiffness of joint, not elsewhere classified, lower leg 02/12/2011   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  Long redundant colon, a diminutive sigmoid polyp status post cold biopsy removed. Hyperplastic polyp. Repeat colonoscopy June 2014 due to family history of colon cancer   COLONOSCOPY WITH ESOPHAGOGASTRODUODENOSCOPY (EGD) N/A 11/02/2012   CBN:[id number] gastric mucosa of doubtful, +H.pylori. Incomplete colonoscopy due to patient unable to tolerate exam, proximal colon seen. Patient refused ACBE.   COLONOSCOPY WITH PROPOFOL N/A 03/08/2013   Dr. Jena Gauss: colonic polyp-removed as scribed above. Internal Hemorrhoids. Pathology did not reveal any colonic tissue, only mucus. SURVEILLANCE DUE Aug 2019   COLONOSCOPY WITH PROPOFOL N/A 01/19/2018   Dr. Jena Gauss: 6 mm cecal inflammatoy polyp, otherwise normal., Surveilance in 5 year s   ESOPHAGEAL DILATION  12/20/2017   Procedure: ESOPHAGEAL DILATION;  Surgeon: West Bali, MD;  Location: [facility];  Service: Endoscopy;;   ESOPHAGOGASTRODUODENOSCOPY  01/18/2008   RMR: Normal esophagus, normal  stomach   ESOPHAGOGASTRODUODENOSCOPY (EGD) WITH PROPOFOL N/A 02/09/2016   Normal esophagus, small hiatal hernia, portal hypertensive gastropathy, normal second portion of duodenum. Due in July 2019   ESOPHAGOGASTRODUODENOSCOPY (EGD) WITH PROPOFOL N/A 12/20/2017   Dr. Darrick Penna: benign  appearing esophageal stenosis s/p dilation, mild pyloric stenosis s/p biopsy and dilation, mild duodenitis.    ESOPHAGOGASTRODUODENOSCOPY (EGD) WITH PROPOFOL N/A 06/12/2020   Rourk: Normal esophagus, portal hypertensive gastropathy, no specimens collected.  Tentatively plan for repeat EGD in 2 years.   HYSTEROSCOPY  05/11/2016   Procedure: HYSTEROSCOPY;  Surgeon: Tilda Burrow, MD;  Location: [facility];  Service: Gynecology;;   KNEE SURGERY     left knee   MULTIPLE EXTRACTIONS WITH ALVEOLOPLASTY N/A 10/15/2013   Procedure: MULTIPLE EXTRACTION WITH ALVEOLOPLASTY;  Surgeon: Georgia Lopes, DDS;  Location: [facility];  Service: Oral Surgery;  Laterality: N/A;   POLYPECTOMY N/A 03/08/2013   Procedure: POLYPECTOMY;  Surgeon: Corbin Ade, MD;  Location: [facility];  Service: Endoscopy;  Laterality: N/A;   POLYPECTOMY N/A 05/11/2016   Procedure: REMOVAL OF ENDOMETRIAL POLYP;  Surgeon: Tilda Burrow, MD;  Location: [facility];  Service: Gynecology;  Laterality: N/A;   RESECTION DISTAL CLAVICAL Right 01/30/2019   Procedure: RESECTION DISTAL CLAVICAL;  Surgeon: Vickki Hearing, MD;  Location: [facility];  Service: Orthopedics;  Laterality: Right;   SHOULDER OPEN ROTATOR CUFF REPAIR Right 01/30/2019  Procedure: ROTATOR CUFF REPAIR SHOULDER OPEN;  Surgeon: Carole Civil, MD;  Location: [facility];  Service: Orthopedics;  Laterality: Right;  pt to arrive at 7:30 for PICC at 8:00   [place] Left 02/05/2015   Procedure: LEFT TOTAL KNEE ARTHROPLASTY;  Surgeon: Carole Civil, MD;  Location: [facility];  Service: Orthopedics;  Laterality: Left;     OB History     Gravida      Para      Term      Preterm      AB      Living  1      SAB      IAB      Ectopic      Multiple      Live Births              Family History  Problem Relation Age of Onset   Colon cancer Brother 56           Multiple myeloma Brother    Liver cancer Sister    Prostate cancer Brother    Pancreatic  cancer Brother    Cancer Mother        breast   Asthma Mother     Social History   Tobacco Use   Smoking status: Every Day    Packs/day: 1.00    Years: 40.00    Pack years: 40.00    Types: Cigarettes   Smokeless tobacco: Never   Tobacco comments:    Smokes one pack of cigarettes every 1.5 days  Vaping Use   Vaping Use: Never used  Substance Use Topics   Alcohol use: Yes    Alcohol/week: 0.0 standard drinks    Comment: 1 bottle of dinner wine daily   Drug use: Not Currently    Types: Marijuana    Comment: history of marijuana in past- almost 2 years ago    Home Medications Prior to Admission medications   Medication Sig Start Date End Date Taking? Authorizing Provider  albuterol (PROVENTIL HFA;VENTOLIN HFA) 108 (90 Base) MCG/ACT inhaler Inhale 2 puffs into the lungs every 6 (six) hours as needed for wheezing or shortness of breath.    Yes [provider]  allopurinol (ZYLOPRIM) 300 MG tablet Take 300 mg by mouth daily. 04/15/21  Yes [provider]  cyclobenzaprine (FLEXERIL) 5 MG tablet Take 5 mg by mouth 3 (three) times daily. 04/15/21  Yes [provider]  folic acid (FOLVITE) 1 MG tablet Take 1 tablet (1 mg total) by mouth daily. 01/06/21  Yes Barton Dubois, MD  gabapentin (NEURONTIN) 100 MG capsule Take 1 capsule (100 mg total) by mouth 3 (three) times daily. Patient taking differently: Take 100 mg by mouth 2 (two) times daily. Takes 1 tablet in the morning and 1 tablet in the evening 12/22/18  Yes Carole Civil, MD  hydrALAZINE (APRESOLINE) 50 MG tablet Take 1 tablet (50 mg total) by mouth every 8 (eight) hours. 01/30/18  Yes Barton Dubois, MD  HYDROcodone-acetaminophen (NORCO/VICODIN) 5-325 MG tablet Take 1 tablet by mouth 3 (three) times daily as needed for pain. 12/04/20  Yes [provider]  hydrOXYzine (ATARAX/VISTARIL) 10 MG tablet Take 10 mg by mouth daily as needed. 11/30/17  Yes [provider]  labetalol (NORMODYNE) 100  MG tablet Take 2 tablets (200 mg total) by mouth 2 (two) times daily. Patient taking differently: Take 100 mg by mouth in the morning. 01/30/18  Yes Barton Dubois, MD  lactulose (  CHRONULAC) 10 GM/15ML solution Take 30 mLs (20 g total) by mouth 2 (two) times daily as needed for mild constipation or moderate constipation. 05/29/21  Yes Daleen Bo, MD  megestrol (MEGACE) 40 MG tablet Take 40 mg by mouth daily. 04/02/21  Yes [provider]  naloxone (NARCAN) nasal spray 4 mg/0.1 mL See admin instructions. ADMINISTER A SINGLE SPRAYEOF NARCAN INTO ONECNOSTRIL. REPEAT AFTER 3SMINUTES IN OTHER NOSTRIL IF NO RESPONSE. 10/03/20  Yes [provider]  ondansetron (ZOFRAN ODT) 4 MG disintegrating tablet 4mg  ODT q4 hours prn nausea/vomit 05/11/21  Yes Milton Ferguson, MD  pantoprazole (PROTONIX) 40 MG tablet TAKE (1) TABLET BY MOUTH DAILY 30 MINUTES BEFORE BREAKFAST. Patient taking differently: Take 40 mg by mouth daily. 10/27/20  Yes Aliene Altes S, PA-C  polyethylene glycol (GOLYTELY) 236 g solution Drink 8 ounces ( 1cup) of solution every hour until you have a bowel movement. 05/31/21  Yes Carolle Ishii, Almyra Free, PA-C  polyethylene glycol (MIRALAX) 17 g packet Take 17 g by mouth daily as needed for mild constipation. 01/05/21  Yes Barton Dubois, MD  potassium chloride (MICRO-K) 10 MEQ CR capsule Take 20 mEq by mouth daily. 05/30/21  Yes [provider]  HYDROmorphone (DILAUDID) 4 MG tablet Take 1 tablet (4 mg total) by mouth every 6 (six) hours as needed for severe pain. Patient not taking: Reported on 05/31/2021 05/11/21   Milton Ferguson, MD  thiamine 100 MG tablet Take 1 tablet (100 mg total) by mouth daily. Patient not taking: No sig reported 01/06/21   Barton Dubois, MD  TRULANCE 3 MG TABS Take 3 mg by mouth daily. Patient not taking: No sig reported 01/01/21   [provider]    Allergies    Penicillins  Review of Systems   Review of Systems  Constitutional:  Negative for  chills and fever.  HENT:  Negative for congestion and sore throat.   Eyes: Negative.   Respiratory:  Negative for chest tightness and shortness of breath.   Cardiovascular:  Negative for chest pain.  Gastrointestinal:  Positive for abdominal distention, constipation and rectal pain. Negative for abdominal pain and nausea.  Genitourinary: Negative.  Negative for dysuria.  Musculoskeletal:  Negative for arthralgias, joint swelling and neck pain.  Skin: Negative.  Negative for rash and wound.  Neurological:  Negative for dizziness, weakness, light-headedness, numbness and headaches.  Psychiatric/Behavioral: Negative.    All other systems reviewed and are negative.  Physical Exam Updated Vital Signs BP (!) 200/121   Pulse (!) 116   Temp 98.3 F (36.8 C) (Oral)   Resp (!) 29   Ht 5\' 6"  (1.676 m)   Wt 76.7 kg   SpO2 94%   BMI 27.28 kg/m   Physical Exam Vitals and nursing note reviewed. Exam conducted with a chaperone present.  Constitutional:      Appearance: She is well-developed.  HENT:     Head: Normocephalic and atraumatic.  Eyes:     Conjunctiva/sclera: Conjunctivae normal.  Cardiovascular:     Rate and Rhythm: Normal rate and regular rhythm.     Heart sounds: Normal heart sounds.  Pulmonary:     Effort: Pulmonary effort is normal.     Breath sounds: Normal breath sounds. No wheezing.  Abdominal:     General: Abdomen is protuberant. Bowel sounds are normal.     Palpations: Abdomen is soft.     Tenderness: There is no abdominal tenderness. There is no guarding.  Genitourinary:    Comments: Scant soft, pale appearing  stool in rectal vault.  I can appreciate firmer stool at the tip of my finger but unable to offer assistance with disimpaction. No hemorrhoids. Musculoskeletal:        General: Normal range of motion.     Cervical back: Normal range of motion.  Skin:    General: Skin is warm and dry.  Neurological:     Mental Status: She is alert.    ED Results /  Procedures / Treatments   Labs (all labs ordered are listed, but only abnormal results are displayed) Labs Reviewed  BASIC METABOLIC PANEL - Abnormal; Notable for the following components:      Result Value   Sodium 133 (*)    CO2 19 (*)    Glucose, Bld 141 (*)    BUN <5 (*)    All other components within normal limits  HEPATIC FUNCTION PANEL - Abnormal; Notable for the following components:   Albumin 3.1 (*)    Bilirubin, Direct 0.3 (*)    All other components within normal limits  CBC WITH DIFFERENTIAL/PLATELET - Abnormal; Notable for the following components:   RBC 3.27 (*)    Hemoglobin 10.8 (*)    HCT 31.9 (*)    All other components within normal limits  LIPASE, BLOOD    EKG None  Radiology CT ABDOMEN PELVIS WO CONTRAST  Result Date: 05/31/2021 CLINICAL DATA:  Left lower quadrant abdominal pain.  Constipation. EXAM: CT ABDOMEN AND PELVIS WITHOUT CONTRAST TECHNIQUE: Multidetector CT imaging of the abdomen and pelvis was performed following the standard protocol without IV contrast. COMPARISON:  CT scan 05/11/2021 FINDINGS: Lower chest: The lung bases are clear of acute process. No pleural effusion or pulmonary lesions. The heart is normal in size. No pericardial effusion. The distal esophagus and aorta are unremarkable. Hepatobiliary: No hepatic lesions are identified without contrast. No intrahepatic biliary dilatation. The gallbladder appears normal. No common bile duct dilatation. Pancreas: No mass, inflammation or ductal dilatation. Spleen: Normal size.  No focal lesions. Adrenals/Urinary Tract: Adrenal glands and kidneys are unremarkable. No renal calculi or hydroureteronephrosis. The bladder is unremarkable. Stomach/Bowel: The stomach, duodenum and small bowel are unremarkable. Scattered air-fluid levels throughout the small bowel but no distension to suggest obstruction. The terminal ileum is unremarkable. Moderate stool throughout the colon. There is also moderate stool in  the rectum with rectal wall thickening possibly reflecting fecal impaction. No colonic mass or acute colonic inflammatory process. The appendix is normal. Vascular/Lymphatic: Stable age advanced atherosclerotic calcifications involving the aorta and iliac arteries but no aneurysm. No mesenteric retroperitoneal mass or adenopathy. Reproductive: Stable calcified uterine fibroids. The ovaries are unremarkable. Other: No pelvic mass or adenopathy. No free pelvic fluid collections. No inguinal mass or adenopathy. No abdominal wall hernia or subcutaneous lesions. Musculoskeletal: No significant bony findings. IMPRESSION: 1. No acute abdominal/pelvic findings, mass lesions or adenopathy. 2. Moderate stool throughout the colon and moderate stool in the rectum with rectal wall thickening likely reflecting fecal impaction. 3. Stable calcified uterine fibroids. 4. Stable age advanced atherosclerotic calcifications involving the aorta and iliac arteries. Aortic Atherosclerosis (ICD10-I70.0). Electronically Signed   By: Marijo Sanes M.D.   On: 05/31/2021 20:57   DG Abdomen 1 View  Result Date: 05/31/2021 CLINICAL DATA:  Constipation for 2 days EXAM: ABDOMEN - 1 VIEW COMPARISON:  05/29/2021 FINDINGS: Moderate amount of stool in the ascending colon. No bowel dilatation to suggest obstruction. No evidence of pneumoperitoneum, portal venous gas or pneumatosis. No pathologic calcifications along the expected course of  the ureters. No acute osseous abnormality. IMPRESSION: Moderate amount of stool in the ascending colon. Electronically Signed   By: Kathreen Devoid M.D.   On: 05/31/2021 16:42    Procedures Procedures   Medications Ordered in ED Medications  labetalol (NORMODYNE) tablet 200 mg (has no administration in time range)  oxyCODONE-acetaminophen (PERCOCET/ROXICET) 5-325 MG per tablet 1 tablet (has no administration in time range)  bisacodyl (DULCOLAX) EC tablet 5 mg (5 mg Oral Given 05/31/21 1721)  morphine 4  MG/ML injection 4 mg (4 mg Intramuscular Given 05/31/21 2030)  ondansetron (ZOFRAN-ODT) disintegrating tablet 4 mg (4 mg Oral Given 05/31/21 2029)  bisacodyl (DULCOLAX) suppository 10 mg (10 mg Rectal Given 05/31/21 2115)  sodium phosphate (FLEET) 7-19 GM/118ML enema 1 enema (1 enema Rectal Given 05/31/21 2227)    ED Course  I have reviewed the triage vital signs and the nursing notes.  Pertinent labs & imaging results that were available during my care of the patient were reviewed by me and considered in my medical decision making (see chart for details).    MDM Rules/Calculators/A&P                           Patient continued to have significant pain prior to discharge home, therefore rescinded her discharge, we obtained a CT abdomen and pelvis.  She does have moderate constipation, there is the suggestion of impaction within the rectum.  At repeat rectal exam in order to insert a Dulcolax suppository, there was some firm stool again barely beyond the reach of ability to digitally disimpact.  She was given a fleets enema and has had a small amount of soft stool.  We will give her prescription for GoLytely with instructions to drink 1 cup every hour until she passes the stool.  Prior to discharge he was noted that she has an elevated blood pressure at 200/121.  She has not had her evening dose of her labetalol, this was given to her prior to discharge home.  She was given strict return precautions to return for recheck if the GoLytely does not resolve this problem for her. Final Clinical Impression(s) / ED Diagnoses Final diagnoses:  Drug-induced constipation    Rx / DC Orders ED Discharge Orders          Ordered    polyethylene glycol (GOLYTELY) 236 g solution       Note to Pharmacy: This is not for pediatric use.   05/31/21 [place], Amar Sippel, PA-C 05/31/21 2307    Milton Ferguson, MD 06/01/21 1044

## 2021-05-31 NOTE — Discharge Instructions (Signed)
Drink one cup of the medicine prescribed every hour until you have a bowel movement.  This should resolve your constipation.  Do not continue to drink once you have had results.

## 2021-05-31 NOTE — ED Notes (Signed)
Patient transported to CT 

## 2021-05-31 NOTE — ED Notes (Signed)
Pt returned from CT °

## 2021-05-31 NOTE — ED Triage Notes (Signed)
Pt. States they have been constipated for 2 days.

## 2021-06-01 NOTE — ED Notes (Signed)
Pt had small amount of soft stool, states she feels like hard stool has moved some, PA notified. PA will check before pt leaves.

## 2021-06-01 NOTE — ED Notes (Signed)
PA at bedside.

## 2021-06-04 DIAGNOSIS — R030 Elevated blood-pressure reading, without diagnosis of hypertension: Secondary | ICD-10-CM | POA: Diagnosis not present

## 2021-06-04 DIAGNOSIS — M5090 Cervical disc disorder, unspecified, unspecified cervical region: Secondary | ICD-10-CM | POA: Diagnosis not present

## 2021-06-04 DIAGNOSIS — Z79899 Other long term (current) drug therapy: Secondary | ICD-10-CM | POA: Diagnosis not present

## 2021-06-04 DIAGNOSIS — F17200 Nicotine dependence, unspecified, uncomplicated: Secondary | ICD-10-CM | POA: Diagnosis not present

## 2021-06-04 DIAGNOSIS — F17210 Nicotine dependence, cigarettes, uncomplicated: Secondary | ICD-10-CM | POA: Diagnosis not present

## 2021-06-04 DIAGNOSIS — G894 Chronic pain syndrome: Secondary | ICD-10-CM | POA: Diagnosis not present

## 2021-06-08 ENCOUNTER — Encounter (HOSPITAL_COMMUNITY): Payer: Self-pay

## 2021-06-08 ENCOUNTER — Emergency Department (HOSPITAL_COMMUNITY)
Admission: EM | Admit: 2021-06-08 | Discharge: 2021-06-08 | Disposition: A | Discharge status: Home / Self Care | Payer: Medicare Other | Attending: Emergency Medicine | Admitting: Emergency Medicine

## 2021-06-08 ENCOUNTER — Other Ambulatory Visit: Payer: Self-pay

## 2021-06-08 DIAGNOSIS — J45909 Unspecified asthma, uncomplicated: Secondary | ICD-10-CM | POA: Insufficient documentation

## 2021-06-08 DIAGNOSIS — Z96652 Presence of left artificial knee joint: Secondary | ICD-10-CM | POA: Diagnosis not present

## 2021-06-08 DIAGNOSIS — F17210 Nicotine dependence, cigarettes, uncomplicated: Secondary | ICD-10-CM | POA: Insufficient documentation

## 2021-06-08 DIAGNOSIS — M79632 Pain in left forearm: Secondary | ICD-10-CM | POA: Insufficient documentation

## 2021-06-08 DIAGNOSIS — X110XXA Contact with hot water in bath or tub, initial encounter: Secondary | ICD-10-CM | POA: Diagnosis not present

## 2021-06-08 DIAGNOSIS — Z7951 Long term (current) use of inhaled steroids: Secondary | ICD-10-CM | POA: Diagnosis not present

## 2021-06-08 DIAGNOSIS — T2103XA Burn of unspecified degree of upper back, initial encounter: Secondary | ICD-10-CM | POA: Insufficient documentation

## 2021-06-08 DIAGNOSIS — M546 Pain in thoracic spine: Secondary | ICD-10-CM | POA: Diagnosis not present

## 2021-06-08 DIAGNOSIS — Z79899 Other long term (current) drug therapy: Secondary | ICD-10-CM | POA: Diagnosis not present

## 2021-06-08 DIAGNOSIS — M79622 Pain in left upper arm: Secondary | ICD-10-CM | POA: Diagnosis not present

## 2021-06-08 DIAGNOSIS — I10 Essential (primary) hypertension: Secondary | ICD-10-CM | POA: Diagnosis not present

## 2021-06-08 DIAGNOSIS — T22032A Burn of unspecified degree of left upper arm, initial encounter: Secondary | ICD-10-CM | POA: Insufficient documentation

## 2021-06-08 DIAGNOSIS — T22112A Burn of first degree of left forearm, initial encounter: Secondary | ICD-10-CM | POA: Diagnosis not present

## 2021-06-08 DIAGNOSIS — T310 Burns involving less than 10% of body surface: Secondary | ICD-10-CM | POA: Insufficient documentation

## 2021-06-08 DIAGNOSIS — R000 Tachycardia, unspecified: Secondary | ICD-10-CM | POA: Insufficient documentation

## 2021-06-08 DIAGNOSIS — M25532 Pain in left wrist: Secondary | ICD-10-CM | POA: Diagnosis not present

## 2021-06-08 MED ORDER — MORPHINE SULFATE (PF) 4 MG/ML IV SOLN
4.0000 mg | Freq: Once | INTRAVENOUS | Status: DC
  Filled 2021-06-08: qty 1

## 2021-06-08 MED ORDER — OXYCODONE-ACETAMINOPHEN 5-325 MG PO TABS
1.0000 | ORAL_TABLET | Freq: Once | ORAL | Status: AC
  Administered 2021-06-08: 1 via ORAL
  Filled 2021-06-08: qty 1

## 2021-06-08 MED ORDER — HYDROCODONE-ACETAMINOPHEN 5-325 MG PO TABS: 1.0000 | ORAL_TABLET | Freq: Four times a day (QID) | ORAL | 0 refills | Status: AC | PRN
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
## 2021-06-08 NOTE — ED Notes (Signed)
Dc instructions and scripts reviewed with pt no questions or concerns. Pt wheeled to the waiting room and transported home by family member

## 2021-06-12 ENCOUNTER — Other Ambulatory Visit: Payer: Self-pay | Admitting: *Deleted

## 2021-06-12 DIAGNOSIS — X088XXA Exposure to other specified smoke, fire and flames, initial encounter: Secondary | ICD-10-CM | POA: Diagnosis not present

## 2021-06-12 DIAGNOSIS — T300 Burn of unspecified body region, unspecified degree: Secondary | ICD-10-CM | POA: Diagnosis not present

## 2021-06-12 DIAGNOSIS — M5090 Cervical disc disorder, unspecified, unspecified cervical region: Secondary | ICD-10-CM | POA: Diagnosis not present

## 2021-06-12 DIAGNOSIS — I639 Cerebral infarction, unspecified: Secondary | ICD-10-CM | POA: Diagnosis not present

## 2021-06-12 NOTE — Patient Outreach (Signed)
[facility]) Care Management  06/12/2021  RAQUELLE PIETRO [birth year] [id number]   Outgoing call placed to member to complete initial assessment, no answer.  Unable to leave voice message as mailbox is full.  Will follow up within the next 3-4 business days.  Valente David, [place], MSN [place] [phone]

## 2021-06-17 ENCOUNTER — Other Ambulatory Visit: Payer: Self-pay | Admitting: *Deleted

## 2021-06-17 NOTE — Patient Outreach (Signed)
[facility] [facility]) Care Management  06/17/2021  ANANDI ABRAMO [birth year] [id number]   Outreach attempt #2 to both listed home number (voice mail full) and mobile number (HIPAA compliant voice message left).  Will send outreach letter and follow up within the next 3-4 business days.  Valente David, [place], MSN [place] [phone]

## 2021-06-23 ENCOUNTER — Other Ambulatory Visit: Payer: Self-pay | Admitting: *Deleted

## 2021-06-23 NOTE — Patient Outreach (Signed)
[facility]) Care Management  06/23/2021  SIAN ROCKERS 1955/12/31 [id number]     Outreach attempt #3 to both listed home number (voice mail full) and mobile number (HIPAA compliant voice message left).  Will make 4th and final attempt within the next 4 weeks.  If remain unsuccessful, will close case due to inability to maintain contact.  Valente David, [place], MSN [place] [phone]

## 2021-06-30 ENCOUNTER — Other Ambulatory Visit: Payer: Self-pay

## 2021-06-30 ENCOUNTER — Encounter: Payer: Self-pay | Admitting: Internal Medicine

## 2021-06-30 ENCOUNTER — Ambulatory Visit (INDEPENDENT_AMBULATORY_CARE_PROVIDER_SITE_OTHER): Payer: Medicare Other | Admitting: Internal Medicine

## 2021-06-30 VITALS — BP 124/83 | HR 118 | Temp 96.9°F | Ht 66.0 in | Wt 145.4 lb

## 2021-06-30 DIAGNOSIS — I774 Celiac artery compression syndrome: Secondary | ICD-10-CM

## 2021-06-30 DIAGNOSIS — K219 Gastro-esophageal reflux disease without esophagitis: Secondary | ICD-10-CM

## 2021-06-30 DIAGNOSIS — K5900 Constipation, unspecified: Secondary | ICD-10-CM

## 2021-06-30 MED ORDER — TRULANCE 3 MG PO TABS: 3.0000 mg | ORAL_TABLET | Freq: Every day | ORAL | 11 refills | Status: AC

## 2021-06-30 MED ORDER — PANTOPRAZOLE SODIUM 40 MG PO TBEC: 40.0000 mg | DELAYED_RELEASE_TABLET | Freq: Two times a day (BID) | ORAL | 11 refills | Status: DC
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
## 2021-06-30 NOTE — Progress Notes (Signed)
Primary Care Physician:  Sandi Mariscal, MD Primary Gastroenterologist:  Dr. Gala Romney  Pre-Procedure History & Physical: HPI:  Colleen Lee is a 65 y.o. female here for follow-up of constipation and GERD.  History of SMA compression/median arcuate ligament syndrome, EtOH related pancreatitis, history of HCV-eradicated with advanced hepatic fibrosis..   Seen lastly in February of this year.  Unfortunately, she suffered a CVA about 1 month ago.  She has left-sided defect. Worsening of constipation increase stool load seen seen on CT done 1 month ago in the ED.  Noncontrast study.  Incidentally, liver appeared normal. Some breakthrough GERD symptoms on Protonix 40 mg once in the morning.  Has a history of SMA compression/median arcuate ligament syndrome.  Seen by vascular surgery.  No follow-up.  Also history of mild pyloric stenosis  - status post dilation previously.  She does have some symptoms of early satiety; eats small amounts at a time.  Complains of regurgitation and heartburn in spite of Protonix 40 mg daily  Patient states PCP stopped Trulance earlier in the year.  Bowel function was good on Trulance.  She does continue to take opioids per her report.  She is not taking lactulose.  Patient denies drinking any more alcohol.  This lady weight 189 pounds back in July 2020 by our scales - 145 today..   Past Medical History:  Diagnosis Date   Asthma    Chronic abdominal pain    Chronic back pain    Chronic constipation    Cirrhosis (HCC)    Metavir score F4 on elastography 1103   Cyclical vomiting    Fibroids    Fibromyalgia    GERD (gastroesophageal reflux disease)    H. pylori infection 2014   treated with pylera, had to be treated again as it was not eradicated. Urea breath test then negative after subsequent treatment.    [name]    HCV RNA positive 09/2012   Hypertension    Marijuana use    Nausea and vomiting    chronic, recurrent   PONV (postoperative nausea and  vomiting)    pt thinks maybe once she had N&V   Sciatica of left side     Past Surgical History:  Procedure Laterality Date   BALLOON DILATION  12/20/2017   Procedure: BALLOON DILATION;  Surgeon: Danie Binder, MD;  Location: [facility];  Service: Endoscopy;;  pyloric dilation   BIOPSY  12/20/2017   Procedure: BIOPSY;  Surgeon: Danie Binder, MD;  Location: [facility];  Service: Endoscopy;;  duodenal and gastric biopsy   COLONOSCOPY  01/18/2008   PRX:YVOPFY rectum.  Long redundant colon, a diminutive sigmoid polyp status post cold biopsy removed. Hyperplastic polyp. Repeat colonoscopy June 2014 due to family history of colon cancer   COLONOSCOPY WITH ESOPHAGOGASTRODUODENOSCOPY (EGD) N/A 11/02/2012   TWK:MQKMMNOT gastric mucosa of doubtful, +H.pylori. Incomplete colonoscopy due to patient unable to tolerate exam, proximal colon seen. Patient refused ACBE.   COLONOSCOPY WITH PROPOFOL N/A 03/08/2013   Dr. Gala Romney: colonic polyp-removed as scribed above. Internal Hemorrhoids. Pathology did not reveal any colonic tissue, only mucus. SURVEILLANCE DUE Aug 2019   COLONOSCOPY WITH PROPOFOL N/A 01/19/2018   Dr. Gala Romney: 6 mm cecal inflammatoy polyp, otherwise normal., Surveilance in 5 year s   ESOPHAGEAL DILATION  12/20/2017   Procedure: ESOPHAGEAL DILATION;  Surgeon: Danie Binder, MD;  Location: [facility];  Service: Endoscopy;;   ESOPHAGOGASTRODUODENOSCOPY  01/18/2008   RMR: Normal esophagus, normal  stomach   ESOPHAGOGASTRODUODENOSCOPY (EGD) WITH PROPOFOL N/A 02/09/2016   Normal esophagus, small hiatal hernia, portal hypertensive gastropathy, normal second portion of duodenum. Due in July 2019   ESOPHAGOGASTRODUODENOSCOPY (EGD) WITH PROPOFOL N/A 12/20/2017   Dr. Oneida Alar: benign appearing esophageal stenosis s/p dilation, mild pyloric stenosis s/p biopsy and dilation, mild duodenitis.    ESOPHAGOGASTRODUODENOSCOPY (EGD) WITH PROPOFOL N/A 06/12/2020   Philena Obey: Normal esophagus, portal hypertensive  gastropathy, no specimens collected.  Tentatively plan for repeat EGD in 2 years.   HYSTEROSCOPY  05/11/2016   Procedure: HYSTEROSCOPY;  Surgeon: Jonnie Kind, MD;  Location: [facility];  Service: Gynecology;;   KNEE SURGERY     left knee   MULTIPLE EXTRACTIONS WITH ALVEOLOPLASTY N/A 10/15/2013   Procedure: MULTIPLE EXTRACTION WITH ALVEOLOPLASTY;  Surgeon: Gae Bon, DDS;  Location: [facility];  Service: Oral Surgery;  Laterality: N/A;   POLYPECTOMY N/A 03/08/2013   Procedure: POLYPECTOMY;  Surgeon: Daneil Dolin, MD;  Location: [facility];  Service: Endoscopy;  Laterality: N/A;   POLYPECTOMY N/A 05/11/2016   Procedure: REMOVAL OF ENDOMETRIAL POLYP;  Surgeon: Jonnie Kind, MD;  Location: [facility];  Service: Gynecology;  Laterality: N/A;   RESECTION DISTAL CLAVICAL Right 01/30/2019   Procedure: RESECTION DISTAL CLAVICAL;  Surgeon: Carole Civil, MD;  Location: [facility];  Service: Orthopedics;  Laterality: Right;   SHOULDER OPEN ROTATOR CUFF REPAIR Right 01/30/2019   Procedure: ROTATOR CUFF REPAIR SHOULDER OPEN;  Surgeon: Carole Civil, MD;  Location: [facility];  Service: Orthopedics;  Laterality: Right;  pt to arrive at 7:30 for PICC at 8:00   [place] Left 02/05/2015   Procedure: LEFT TOTAL KNEE ARTHROPLASTY;  Surgeon: Carole Civil, MD;  Location: [facility];  Service: Orthopedics;  Laterality: Left;    Prior to Admission medications   Medication Sig Start Date End Date Taking? Authorizing Provider  albuterol (PROVENTIL HFA;VENTOLIN HFA) 108 (90 Base) MCG/ACT inhaler Inhale 2 puffs into the lungs every 6 (six) hours as needed for wheezing or shortness of breath.    Yes [provider]  allopurinol (ZYLOPRIM) 300 MG tablet Take 300 mg by mouth daily. 04/15/21  Yes [provider]  cyclobenzaprine (FLEXERIL) 5 MG tablet Take 5 mg by mouth 3 (three) times daily. 04/15/21  Yes [provider]  folic acid (FOLVITE) 1 MG tablet Take 1 tablet (1 mg total) by  mouth daily. 01/06/21  Yes Barton Dubois, MD  gabapentin (NEURONTIN) 100 MG capsule Take 1 capsule (100 mg total) by mouth 3 (three) times daily. Patient taking differently: Take 100 mg by mouth daily. 12/22/18  Yes Carole Civil, MD  hydrALAZINE (APRESOLINE) 50 MG tablet Take 1 tablet (50 mg total) by mouth every 8 (eight) hours. 01/30/18  Yes Barton Dubois, MD  HYDROcodone-acetaminophen (NORCO/VICODIN) 5-325 MG tablet Take 1 tablet by mouth every 6 (six) hours as needed. 06/08/21  Yes Noemi Chapel, MD  hydrOXYzine (ATARAX/VISTARIL) 10 MG tablet Take 10 mg by mouth daily as needed. 11/30/17  Yes [provider]  labetalol (NORMODYNE) 100 MG tablet Take 2 tablets (200 mg total) by mouth 2 (two) times daily. Patient taking differently: Take 100 mg by mouth in the morning. 01/30/18  Yes Barton Dubois, MD  lactulose (CHRONULAC) 10 GM/15ML solution Take 30 mLs (20 g total) by mouth 2 (two) times daily as needed for mild constipation or moderate constipation. 05/29/21  Yes Daleen Bo, MD  megestrol (MEGACE) 40 MG tablet Take 40 mg by mouth daily. 04/02/21  Yes [provider]  naloxone (NARCAN) nasal spray 4 mg/0.1 mL See admin instructions. ADMINISTER A SINGLE SPRAYEOF NARCAN INTO ONECNOSTRIL. REPEAT AFTER 3SMINUTES IN OTHER NOSTRIL IF NO RESPONSE. 10/03/20  Yes [provider]  ondansetron (ZOFRAN ODT) 4 MG disintegrating tablet 5m ODT q4 hours prn nausea/vomit 05/11/21  Yes ZMilton Ferguson MD  pantoprazole (PROTONIX) 40 MG tablet TAKE (1) TABLET BY MOUTH DAILY 30 MINUTES BEFORE BREAKFAST. Patient taking differently: Take 40 mg by mouth daily. 10/27/20  Yes HAliene AltesS, PA-C  polyethylene glycol (MIRALAX) 17 g packet Take 17 g by mouth daily as needed for mild constipation. 01/05/21  Yes MBarton Dubois MD  potassium chloride (MICRO-K) 10 MEQ CR capsule Take 20 mEq by mouth daily. 05/30/21  Yes [provider]  thiamine 100 MG tablet Take 1 tablet (100 mg total)  by mouth daily. 01/06/21  Yes MBarton Dubois MD  polyethylene glycol (GOLYTELY) 236 g solution Drink 8 ounces ( 1cup) of solution every hour until you have a bowel movement. Patient not taking: Reported on 06/30/2021 05/31/21   IEvalee Jefferson PA-C    Allergies as of 06/30/2021 - Review Complete 06/30/2021  Allergen Reaction Noted   Penicillins Rash     Family History  Problem Relation Age of Onset   Colon cancer Brother 560          Multiple myeloma Brother    Liver cancer Sister    Prostate cancer Brother    Pancreatic cancer Brother    Cancer Mother        breast   Asthma Mother     Social History   Socioeconomic History   Marital status: Married    Spouse name: Not on file   Number of children: 0   Years of education: Not on file   Highest education level: Not on file  Occupational History   Occupation: umemployed   Tobacco Use   Smoking status: Every Day    Packs/day: 1.00    Years: 40.00    Pack years: 40.00    Types: Cigarettes   Smokeless tobacco: Never   Tobacco comments:    Smokes one pack of cigarettes every 1.5 days  Vaping Use   Vaping Use: Never used  Substance and Sexual Activity   Alcohol use: Not Currently    Comment: 1 bottle of dinner wine daily; 06/30/21 stopped drinking   Drug use: Not Currently    Types: Marijuana    Comment: history of marijuana in past- almost 2 years ago   Sexual activity: Never    Birth control/protection: None  Other Topics Concern   Not on file  Social History Narrative   Not on file   Social Determinants of Health   Financial Resource Strain: Not on file  Food Insecurity: Not on file  Transportation Needs: Not on file  Physical Activity: Not on file  Stress: Not on file  Social Connections: Not on file  Intimate Partner Violence: Not on file    Review of Systems: See HPI, otherwise negative ROS  Physical Exam: BP 124/83   Pulse (!) 118   Temp (!) 96.9 F (36.1 C) (Temporal)   Ht _0  (1.676 m)   Wt  145 lb 6.4 oz (66 kg)   BMI 23.47 kg/m  General:   Alert, feeble appearing.  Ambulating with a cane.  Accompanied by her sister, Colleen Lee   Neck:  Supple; no masses or thyromegaly. No significant cervical adenopathy. Lungs:  Clear throughout  to auscultation.   No wheezes, crackles, or rhonchi. No acute distress. Heart:  Regular rate and rhythm; no murmurs, clicks, rubs,  or gallops. Abdomen: Non-distended, normal bowel sounds.  Soft and nontender without appreciable mass or hepatosplenomegaly.  Pulses:  Normal pulses noted. Extremities:  Without clubbing or edema.  Impression/Plan: 65 year old lady with history of EtOH/HCV related chronic liver disease.  Long history of alcohol use disorder.  She is commended on alcohol cessation.  HCV eradicated. I doubt she has advanced cirrhosis.  In fact, eliminating 2 chronic insults to her liver may be associated with improvement in fibrosis. Significant weight loss and upper GI tract symptoms early satiety/"regurgitation".  May be seeing more of a median arcuate ligament syndrome.  She did not follow-up with vascular surgery. History of pyloric stenosis requiring dilation. Constipation and reflux also significant issues at this time.  Recommendations: Lets resume Trulance 3 mg daily (dispense 30 with 11 refills)  Will increase Protonix to 40 mg twice daily-before breakfast and supper every day) dispense 60 with 11 refills)  Information on GERD and constipation provided  We will her back in 3 months  Depending on clinical progress, updated evaluation of mesenteric vasculature may be needed.    Notice: This dictation was prepared with Dragon dictation along with smaller phrase technology. Any transcriptional errors that result from this process are unintentional and may not be corrected upon review.

## 2021-07-03 DIAGNOSIS — R030 Elevated blood-pressure reading, without diagnosis of hypertension: Secondary | ICD-10-CM | POA: Diagnosis not present

## 2021-07-03 DIAGNOSIS — M5090 Cervical disc disorder, unspecified, unspecified cervical region: Secondary | ICD-10-CM | POA: Diagnosis not present

## 2021-07-03 DIAGNOSIS — I639 Cerebral infarction, unspecified: Secondary | ICD-10-CM | POA: Diagnosis not present

## 2021-07-03 DIAGNOSIS — Z6822 Body mass index (BMI) 22.0-22.9, adult: Secondary | ICD-10-CM | POA: Diagnosis not present

## 2021-07-03 DIAGNOSIS — F17210 Nicotine dependence, cigarettes, uncomplicated: Secondary | ICD-10-CM | POA: Diagnosis not present

## 2021-07-03 DIAGNOSIS — F17200 Nicotine dependence, unspecified, uncomplicated: Secondary | ICD-10-CM | POA: Diagnosis not present

## 2021-07-03 DIAGNOSIS — Z79899 Other long term (current) drug therapy: Secondary | ICD-10-CM | POA: Diagnosis not present

## 2021-07-03 DIAGNOSIS — G894 Chronic pain syndrome: Secondary | ICD-10-CM | POA: Diagnosis not present

## 2021-07-08 DIAGNOSIS — Z79899 Other long term (current) drug therapy: Secondary | ICD-10-CM | POA: Diagnosis not present

## 2021-07-09 ENCOUNTER — Emergency Department (HOSPITAL_COMMUNITY): Payer: Medicare Other

## 2021-07-09 ENCOUNTER — Encounter (HOSPITAL_COMMUNITY): Payer: Self-pay | Admitting: *Deleted

## 2021-07-09 ENCOUNTER — Emergency Department (HOSPITAL_COMMUNITY)
Admission: EM | Admit: 2021-07-09 | Discharge: 2021-07-09 | Disposition: A | Discharge status: Home / Self Care | Payer: Medicare Other | Attending: Emergency Medicine | Admitting: Emergency Medicine

## 2021-07-09 DIAGNOSIS — R000 Tachycardia, unspecified: Secondary | ICD-10-CM | POA: Diagnosis not present

## 2021-07-09 DIAGNOSIS — F17210 Nicotine dependence, cigarettes, uncomplicated: Secondary | ICD-10-CM | POA: Insufficient documentation

## 2021-07-09 DIAGNOSIS — Z79899 Other long term (current) drug therapy: Secondary | ICD-10-CM | POA: Diagnosis not present

## 2021-07-09 DIAGNOSIS — Z96652 Presence of left artificial knee joint: Secondary | ICD-10-CM | POA: Diagnosis not present

## 2021-07-09 DIAGNOSIS — R1084 Generalized abdominal pain: Secondary | ICD-10-CM

## 2021-07-09 DIAGNOSIS — R197 Diarrhea, unspecified: Secondary | ICD-10-CM | POA: Diagnosis not present

## 2021-07-09 DIAGNOSIS — J45909 Unspecified asthma, uncomplicated: Secondary | ICD-10-CM | POA: Diagnosis not present

## 2021-07-09 DIAGNOSIS — Z20822 Contact with and (suspected) exposure to covid-19: Secondary | ICD-10-CM | POA: Insufficient documentation

## 2021-07-09 DIAGNOSIS — R1013 Epigastric pain: Secondary | ICD-10-CM | POA: Diagnosis present

## 2021-07-09 DIAGNOSIS — R112 Nausea with vomiting, unspecified: Secondary | ICD-10-CM | POA: Insufficient documentation

## 2021-07-09 DIAGNOSIS — R109 Unspecified abdominal pain: Secondary | ICD-10-CM | POA: Diagnosis not present

## 2021-07-09 DIAGNOSIS — R1031 Right lower quadrant pain: Secondary | ICD-10-CM | POA: Diagnosis not present

## 2021-07-09 DIAGNOSIS — I10 Essential (primary) hypertension: Secondary | ICD-10-CM | POA: Diagnosis not present

## 2021-07-09 LAB — CBC WITH DIFFERENTIAL/PLATELET
Abs Immature Granulocytes: 0.07 10*3/uL (ref 0.00–0.07)
Basophils Absolute: 0 10*3/uL (ref 0.0–0.1)
Basophils Relative: 0 %
Eosinophils Absolute: 0 10*3/uL (ref 0.0–0.5)
Eosinophils Relative: 0 %
HCT: 34.4 % — ABNORMAL LOW (ref 36.0–46.0)
Hemoglobin: 11.5 g/dL — ABNORMAL LOW (ref 12.0–15.0)
Immature Granulocytes: 1 %
Lymphocytes Relative: 6 %
Lymphs Abs: 0.6 10*3/uL — ABNORMAL LOW (ref 0.7–4.0)
MCH: 33.4 pg (ref 26.0–34.0)
MCHC: 33.4 g/dL (ref 30.0–36.0)
MCV: 100 fL (ref 80.0–100.0)
Monocytes Absolute: 0.4 10*3/uL (ref 0.1–1.0)
Monocytes Relative: 4 %
Neutro Abs: 8.8 10*3/uL — ABNORMAL HIGH (ref 1.7–7.7)
Neutrophils Relative %: 89 %
Platelets: 340 10*3/uL (ref 150–400)
RBC: 3.44 MIL/uL — ABNORMAL LOW (ref 3.87–5.11)
RDW: 18.5 % — ABNORMAL HIGH (ref 11.5–15.5)
Smear Review: NORMAL
WBC: 9.9 10*3/uL (ref 4.0–10.5)
nRBC: 0 % (ref 0.0–0.2)

## 2021-07-09 LAB — COMPREHENSIVE METABOLIC PANEL
ALT: 22 U/L (ref 0–44)
AST: 46 U/L — ABNORMAL HIGH (ref 15–41)
Albumin: 2.7 g/dL — ABNORMAL LOW (ref 3.5–5.0)
Alkaline Phosphatase: 126 U/L (ref 38–126)
Anion gap: 9 (ref 5–15)
BUN: 13 mg/dL (ref 8–23)
CO2: 19 mmol/L — ABNORMAL LOW (ref 22–32)
Calcium: 11 mg/dL — ABNORMAL HIGH (ref 8.9–10.3)
Chloride: 108 mmol/L (ref 98–111)
Creatinine, Ser: 0.87 mg/dL (ref 0.44–1.00)
GFR, Estimated: >60 mL/min (ref >60)
Glucose, Bld: 80 mg/dL (ref 70–99)
Potassium: 4.5 mmol/L (ref 3.5–5.1)
Sodium: 136 mmol/L (ref 135–145)
Total Bilirubin: 1 mg/dL (ref 0.3–1.2)
Total Protein: 7.4 g/dL (ref 6.5–8.1)

## 2021-07-09 LAB — URINALYSIS, ROUTINE W REFLEX MICROSCOPIC
Bilirubin Urine: NEGATIVE
Glucose, UA: NEGATIVE mg/dL
Hgb urine dipstick: NEGATIVE
Ketones, ur: NEGATIVE mg/dL
Leukocytes,Ua: NEGATIVE
Nitrite: NEGATIVE
Protein, ur: NEGATIVE mg/dL
Specific Gravity, Urine: <1.005 — ABNORMAL LOW (ref 1.005–1.030)
pH: 5.5 (ref 5.0–8.0)

## 2021-07-09 LAB — RESP PANEL BY RT-PCR (FLU A&B, COVID) ARPGX2
Influenza A by PCR: NEGATIVE
Influenza B by PCR: NEGATIVE
SARS Coronavirus 2 by RT PCR: NEGATIVE

## 2021-07-09 LAB — LIPASE, BLOOD: Lipase: 26 U/L (ref 11–51)

## 2021-07-09 LAB — TROPONIN I (HIGH SENSITIVITY)
Troponin I (High Sensitivity): 11 ng/L (ref <18)
Troponin I (High Sensitivity): 10 ng/L (ref <18)

## 2021-07-09 MED ORDER — KETOROLAC TROMETHAMINE 30 MG/ML IJ SOLN
15.0000 mg | Freq: Once | INTRAMUSCULAR | Status: AC
  Administered 2021-07-09: 15 mg via INTRAVENOUS
  Filled 2021-07-09: qty 1

## 2021-07-09 MED ORDER — SODIUM CHLORIDE 0.9 % IV BOLUS
1000.0000 mL | Freq: Once | INTRAVENOUS | Status: AC
  Administered 2021-07-09: 1000 mL via INTRAVENOUS

## 2021-07-09 MED ORDER — FAMOTIDINE IN NACL 20-0.9 MG/50ML-% IV SOLN
20.0000 mg | Freq: Once | INTRAVENOUS | Status: AC
  Administered 2021-07-09: 20 mg via INTRAVENOUS
  Filled 2021-07-09: qty 50

## 2021-07-09 MED ORDER — SUCRALFATE 1 G PO TABS: 1.0000 g | ORAL_TABLET | Freq: Three times a day (TID) | ORAL | 0 refills | Status: DC

## 2021-07-09 MED ORDER — MORPHINE SULFATE (PF) 4 MG/ML IV SOLN
4.0000 mg | Freq: Once | INTRAVENOUS | Status: AC
  Administered 2021-07-09: 4 mg via INTRAVENOUS
  Filled 2021-07-09: qty 1

## 2021-07-09 MED ORDER — LIDOCAINE VISCOUS HCL 2 % MT SOLN
15.0000 mL | Freq: Once | OROMUCOSAL | Status: AC
  Administered 2021-07-09: 15 mL via ORAL
  Filled 2021-07-09: qty 15

## 2021-07-09 MED ORDER — IOHEXOL 300 MG/ML  SOLN
100.0000 mL | Freq: Once | INTRAMUSCULAR | Status: AC | PRN
  Administered 2021-07-09: 100 mL via INTRAVENOUS

## 2021-07-09 MED ORDER — ALUM & MAG HYDROXIDE-SIMETH 200-200-20 MG/5ML PO SUSP
30.0000 mL | Freq: Once | ORAL | Status: AC
  Administered 2021-07-09: 30 mL via ORAL
  Filled 2021-07-09: qty 30

## 2021-07-09 MED ORDER — ONDANSETRON HCL 4 MG/2ML IJ SOLN
4.0000 mg | Freq: Once | INTRAMUSCULAR | Status: AC
  Administered 2021-07-09: 4 mg via INTRAVENOUS
  Filled 2021-07-09: qty 2

## 2021-07-09 NOTE — ED Provider Notes (Signed)
[facility] EMERGENCY DEPARTMENT Provider Note   CSN: [id number] Arrival date & time: 07/09/21  1109     History Chief Complaint  Patient presents with   Abdominal Pain    Colleen Lee is a 65 y.o. female.  This is a 65 y.o. female with significant medical history as below, including GERD, cirrhosis, htn, thc use who presents to the ED with complaint of abd pain. N/v.  Patient reports worsening symptoms of the past 2 to 3 days, significant reduced oral intake secondary to nausea, vomiting, midepigastric abdominal discomfort.  She thinks as though she has pancreatitis.  No fevers or chills.  Patient ports shortly after eating she feels nauseated, vomits also is been having intermittent diarrhea.  She is been taking Pepto-Bismol intermittently with mild improvement in her symptoms.  No chest pain or dyspnea.  No recent alcohol use.  No excessive NSAID use.  She is a tobacco smoker.  Patient does report recent colonoscopy which was unremarkable per the patient.  Pain described as sharp, stabbing, burning primarily in the mid epigastrium, also having some discomfort to her right lower quadrant.  No change urination.  No alleviating factors reported.  The history is provided by the patient and the spouse. No language interpreter was used.  Abdominal Pain Associated symptoms: diarrhea, nausea and vomiting   Associated symptoms: no chest pain, no chills, no cough, no fever, no hematuria and no shortness of breath       Past Medical History:  Diagnosis Date   Asthma    Chronic abdominal pain    Chronic back pain    Chronic constipation    Cirrhosis (HCC)    Metavir score F4 on elastography 4599   Cyclical vomiting    Fibroids    Fibromyalgia    GERD (gastroesophageal reflux disease)    H. pylori infection 2014   treated with pylera, had to be treated again as it was not eradicated. Urea breath test then negative after subsequent treatment.    [name]    HCV RNA positive 09/2012    Hypertension    Marijuana use    Nausea and vomiting    chronic, recurrent   PONV (postoperative nausea and vomiting)    pt thinks maybe once she had N&V   Sciatica of left side     Patient Active Problem List   Diagnosis Date Noted   Pancreatitis, recurrent 05/17/2021   Dysuria 09/16/2020   Body mass index (BMI) 31.0-31.9, adult 01/07/2020   Osteoarthritis of spine with radiculopathy, 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   Acute pancreatitis 10/17/2019   ETOH abuse 10/17/2019   S/P right rotator cuff repair 01/30/19 02/06/2019   Nontraumatic complete tear of right rotator cuff    Arthritis of right acromioclavicular joint    S/P total knee replacement, 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/24/2018   Shoulder impingement, right 05/24/2018   Dehydration    Intractable cyclical vomiting 77/41/4239   Pyloric stenosis in adult    Thrombocytopenia ([place]) 12/20/2017   Leukocytosis    Malnutrition of moderate degree 12/17/2017   Acute renal failure (ARF) ([place]) 12/15/2017   Chronic abdominal pain 12/15/2017   Gastroenteritis 06/14/2016   Nausea with vomiting 06/10/2016   Uterine enlargement 06/09/2016   Intractable nausea and vomiting 06/08/2016   Acute infective gastroenteritis 06/08/2016   Diarrhea 06/08/2016   Essential hypertension 06/08/2016   GERD (gastroesophageal reflux disease) 06/08/2016   Abdominal pain    Endometrial polyp 04/15/2016   PMB (postmenopausal bleeding) 53/20/2334   Alcoholic  cirrhosis of liver without ascites ([place])    Asthma 01/21/2016   Hepatic cirrhosis ([place]) 09/22/2015   Arthritis of knee, degenerative 89/21/1941   History of Helicobacter pylori infection 10/30/2014   Lumbago with sciatica 07/01/2014   Chronic [name] with cirrhosis ([place]) 04/11/2014   De Quervain's disease (radial styloid tenosynovitis) 12/25/2013   Anorexia 11/21/2012   FH: colon cancer 11/21/2012   Early satiety 10/25/2012   Bowel habit changes 10/25/2012   Abdominal pain, epigastric 10/25/2012    Abdominal bloating 10/25/2012   Constipation 10/25/2012   Abnormal weight loss 10/25/2012   Chronic viral [name] ([place]) 10/25/2012   Radicular pain of left lower extremity 09/28/2012   Back pain 09/28/2012   Sciatica 08/10/2011   S/P arthroscopy of left knee 08/10/2011   Tibial plateau fracture 08/10/2011   Pain in joint, lower leg 02/12/2011   Stiffness of joint, not elsewhere classified, lower leg 02/12/2011   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  Long redundant colon, a diminutive sigmoid polyp status post cold biopsy removed. Hyperplastic polyp. Repeat colonoscopy June 2014 due to family history of colon cancer   COLONOSCOPY WITH ESOPHAGOGASTRODUODENOSCOPY (EGD) N/A 11/02/2012   EHU:[id number] gastric mucosa of doubtful, +H.pylori. Incomplete colonoscopy due to patient unable to tolerate exam, proximal colon seen. Patient refused ACBE.   COLONOSCOPY WITH PROPOFOL N/A 03/08/2013   Dr. Gala Romney: colonic polyp-removed as scribed above. Internal Hemorrhoids. Pathology did not reveal any colonic tissue, only mucus. SURVEILLANCE DUE Aug 2019   COLONOSCOPY WITH PROPOFOL N/A 01/19/2018   Dr. Gala Romney: 6 mm cecal inflammatoy polyp, otherwise normal., Surveilance in 5 year s   ESOPHAGEAL DILATION  12/20/2017   Procedure: ESOPHAGEAL DILATION;  Surgeon: Danie Binder, MD;  [facility]: [facility];  Service: Endoscopy;;   ESOPHAGOGASTRODUODENOSCOPY  01/18/2008   RMR: Normal esophagus, normal  stomach    ESOPHAGOGASTRODUODENOSCOPY (EGD) WITH PROPOFOL N/A 02/09/2016   Normal esophagus, small hiatal hernia, portal hypertensive gastropathy, normal second portion of duodenum. Due in July 2019   ESOPHAGOGASTRODUODENOSCOPY (EGD) WITH PROPOFOL N/A 12/20/2017   Dr. Oneida Alar: benign appearing esophageal stenosis s/p dilation, mild pyloric stenosis s/p biopsy and dilation, mild duodenitis.    ESOPHAGOGASTRODUODENOSCOPY (EGD) WITH PROPOFOL N/A 06/12/2020   Rourk: Normal esophagus, portal hypertensive gastropathy, no specimens collected.  Tentatively plan for repeat EGD in 2 years.   HYSTEROSCOPY  05/11/2016   Procedure: HYSTEROSCOPY;  Surgeon: Jonnie Kind, MD;  Location: [facility];  Service: Gynecology;;   KNEE SURGERY     left knee   MULTIPLE EXTRACTIONS WITH ALVEOLOPLASTY N/A 10/15/2013   Procedure: MULTIPLE EXTRACTION WITH ALVEOLOPLASTY;  Surgeon: Gae Bon, DDS;  Location: [place];  Service: Oral Surgery;  Laterality: N/A;   POLYPECTOMY N/A 03/08/2013   Procedure: POLYPECTOMY;  Surgeon: Daneil Dolin, MD;  Location: [facility];  Service: Endoscopy;  Laterality: N/A;   POLYPECTOMY N/A 05/11/2016   Procedure: REMOVAL OF ENDOMETRIAL POLYP;  Surgeon: Jonnie Kind, MD;  Location: [facility];  Service:  Gynecology;  Laterality: N/A;   RESECTION DISTAL CLAVICAL Right 01/30/2019   Procedure: RESECTION DISTAL CLAVICAL;  Surgeon: Carole Civil, MD;  Location: [facility];  Service: Orthopedics;  Laterality: Right;   SHOULDER OPEN ROTATOR CUFF REPAIR Right 01/30/2019   Procedure: ROTATOR CUFF REPAIR SHOULDER OPEN;  Surgeon: Carole Civil, MD;  Location: [facility];  Service: Orthopedics;  Laterality: Right;  pt to arrive at 7:30 for PICC at 8:00   [place] Left 02/05/2015   Procedure: LEFT TOTAL KNEE ARTHROPLASTY;  Surgeon: Carole Civil, MD;  Location: [facility];  Service: Orthopedics;  Laterality: Left;     OB History     Gravida      Para      Term      Preterm      AB      Living  1       SAB      IAB      Ectopic      Multiple      Live Births              Family History  Problem Relation Age of Onset   Colon cancer Brother 16           Multiple myeloma Brother    Liver cancer Sister    Prostate cancer Brother    Pancreatic cancer Brother    Cancer Mother        breast   Asthma Mother     Social History   Tobacco Use   Smoking status: Every Day    Packs/day: 1.00    Years: 40.00    Pack years: 40.00    Types: Cigarettes   Smokeless tobacco: Never   Tobacco comments:    Smokes one pack of cigarettes every 1.5 days  Vaping Use   Vaping Use: Never used  Substance Use Topics   Alcohol use: Not Currently    Comment: 1 bottle of dinner wine daily; 06/30/21 stopped drinking   Drug use: Not Currently    Types: Marijuana    Comment: history of marijuana in past- almost 2 years ago    Home Medications Prior to Admission medications   Medication Sig Start Date End Date Taking? Authorizing Provider  albuterol (PROVENTIL HFA;VENTOLIN HFA) 108 (90 Base) MCG/ACT inhaler Inhale 2 puffs into the lungs every 6 (six) hours as needed for wheezing or shortness of breath.     [provider]  allopurinol (ZYLOPRIM) 300 MG tablet Take 300 mg by mouth daily. 04/15/21   [provider]  cyclobenzaprine (FLEXERIL) 5 MG tablet Take 5 mg by mouth 3 (three) times daily. 04/15/21   [provider]  folic acid (FOLVITE) 1 MG tablet Take 1 tablet (1 mg total) by mouth daily. 01/06/21   Barton Dubois, MD  gabapentin (NEURONTIN) 100 MG capsule Take 1 capsule (100 mg total) by mouth 3 (three) times daily. Patient taking differently: Take 100 mg by mouth daily. 12/22/18   Carole Civil, MD  hydrALAZINE (APRESOLINE) 50 MG tablet Take 1 tablet (50 mg total) by mouth every 8 (eight) hours. 01/30/18   Barton Dubois, MD  HYDROcodone-acetaminophen (NORCO/VICODIN) 5-325 MG tablet Take 1 tablet by mouth every 6 (six) hours as needed. 06/08/21   Noemi Chapel, MD  hydrOXYzine (ATARAX/VISTARIL) 10 MG tablet Take 10 mg by mouth daily as needed. 11/30/17   [provider]  labetalol (NORMODYNE) 100 MG tablet Take 2 tablets (200 mg total) by  mouth 2 (two) times daily. Patient taking differently: Take 100 mg by mouth in the morning. 01/30/18   Barton Dubois, MD  lactulose (CHRONULAC) 10 GM/15ML solution Take 30 mLs (20 g total) by mouth 2 (two) times daily as needed for mild constipation or moderate constipation. 05/29/21   Daleen Bo, MD  megestrol (MEGACE) 40 MG tablet Take 40 mg by mouth daily. 04/02/21   [provider]  naloxone [facility]) nasal spray 4 mg/0.1 mL See admin instructions. ADMINISTER A SINGLE SPRAYEOF NARCAN INTO ONECNOSTRIL. REPEAT AFTER 3SMINUTES IN OTHER NOSTRIL IF NO RESPONSE. 10/03/20   [provider]  ondansetron (ZOFRAN ODT) 4 MG disintegrating tablet 4mg  ODT q4 hours prn nausea/vomit 05/11/21   Milton Ferguson, MD  pantoprazole (PROTONIX) 40 MG tablet Take 1 tablet (40 mg total) by mouth 2 (two) times daily. 06/30/21 06/30/22  Rourk, Cristopher Estimable, MD  Plecanatide (TRULANCE) 3 MG TABS Take 3 mg by mouth daily. 06/30/21 06/30/22  Rourk, Cristopher Estimable, MD  polyethylene glycol (GOLYTELY) 236 g solution Drink 8 ounces ( 1cup) of solution every hour until you have a bowel movement. Patient not taking: Reported on 06/30/2021 05/31/21   Evalee Jefferson, PA-C  polyethylene glycol (MIRALAX) 17 g packet Take 17 g by mouth daily as needed for mild constipation. 01/05/21   Barton Dubois, MD  potassium chloride (MICRO-K) 10 MEQ CR capsule Take 20 mEq by mouth daily. 05/30/21   [provider]  thiamine 100 MG tablet Take 1 tablet (100 mg total) by mouth daily. 01/06/21   Barton Dubois, MD    Allergies    Penicillins  Review of Systems   Review of Systems  Constitutional:  Positive for appetite change. Negative for chills and fever.  HENT:  Negative for facial swelling and trouble swallowing.   Eyes:  Negative for  photophobia and visual disturbance.  Respiratory:  Negative for cough and shortness of breath.   Cardiovascular:  Negative for chest pain and palpitations.  Gastrointestinal:  Positive for abdominal pain, diarrhea, nausea and vomiting.  Endocrine: Negative for polydipsia and polyuria.  Genitourinary:  Negative for difficulty urinating and hematuria.  Musculoskeletal:  Negative for gait problem and joint swelling.  Skin:  Negative for pallor and rash.  Neurological:  Negative for syncope and headaches.  Psychiatric/Behavioral:  Negative for agitation and confusion.    Physical Exam Updated Vital Signs BP (!) 155/105   Pulse (!) 110   Temp 98.8 F (37.1 C) (Oral)   Resp 16   SpO2 100%   Physical Exam Vitals and nursing note reviewed.  Constitutional:      General: She is not in acute distress.    Appearance: Normal appearance. She is well-developed. She is not ill-appearing or diaphoretic.  HENT:     Head: Normocephalic and atraumatic.     Right Ear: External ear normal.     Left Ear: External ear normal.     Nose: Nose normal.     Mouth/Throat:     Mouth: Mucous membranes are moist.  Eyes:     General: No scleral icterus.       Right eye: No discharge.        Left eye: No discharge.  Cardiovascular:     Rate and Rhythm: Regular rhythm. Tachycardia present.     Pulses: Normal pulses.     Heart sounds: Normal heart sounds.  Pulmonary:     Effort: Pulmonary effort is normal. No respiratory distress.     Breath sounds: Normal breath sounds.  Abdominal:     General: Abdomen is flat.     Palpations: Abdomen is soft.     Tenderness: There is abdominal tenderness in the right lower quadrant and epigastric area. There is guarding. There is no rebound.     Comments: Not peritoneal  Musculoskeletal:        General: Normal range of motion.     Cervical back: Normal range of motion.     Right lower leg: No edema.     Left lower leg: No edema.  Skin:    General: Skin is warm  and dry.     Capillary Refill: Capillary refill takes less than 2 seconds.  Neurological:     Mental Status: She is alert.  Psychiatric:        Mood and Affect: Mood normal.        Behavior: Behavior normal.    ED Results / Procedures / Treatments   Labs (all labs ordered are listed, but only abnormal results are displayed) Labs Reviewed  CBC WITH DIFFERENTIAL/PLATELET - Abnormal; Notable for the following components:      Result Value   RBC 3.44 (*)    Hemoglobin 11.5 (*)    HCT 34.4 (*)    RDW 18.5 (*)    Neutro Abs 8.8 (*)    Lymphs Abs 0.6 (*)    All other components within normal limits  COMPREHENSIVE METABOLIC PANEL - Abnormal; Notable for the following components:   CO2 19 (*)    Calcium 11.0 (*)    Albumin 2.7 (*)    AST 46 (*)    All other components within normal limits  RESP PANEL BY RT-PCR (FLU A&B, COVID) ARPGX2  LIPASE, BLOOD  URINALYSIS, ROUTINE W REFLEX MICROSCOPIC  TROPONIN I (HIGH SENSITIVITY)  TROPONIN I (HIGH SENSITIVITY)    EKG None  Radiology No results found.  Procedures Procedures   Medications Ordered in ED Medications  sodium chloride 0.9 % bolus 1,000 mL (0 mLs Intravenous Stopped 07/09/21 1541)  famotidine (PEPCID) IVPB 20 mg premix (0 mg Intravenous Stopped 07/09/21 1541)  alum & mag hydroxide-simeth (MAALOX/MYLANTA) 200-200-20 MG/5ML suspension 30 mL (30 mLs Oral Given 07/09/21 1358)    And  lidocaine (XYLOCAINE) 2 % viscous mouth solution 15 mL (15 mLs Oral Given 07/09/21 1358)  ondansetron (ZOFRAN) injection 4 mg (4 mg Intravenous Given 07/09/21 1401)  morphine 4 MG/ML injection 4 mg (4 mg Intravenous Given 07/09/21 1402)  iohexol (OMNIPAQUE) 300 MG/ML solution 100 mL (100 mLs Intravenous Contrast Given 07/09/21 1504)  ketorolac (TORADOL) 30 MG/ML injection 15 mg (15 mg Intravenous Given 07/09/21 1607)    ED Course  I have reviewed the triage vital signs and the nursing notes.  Pertinent labs & imaging results that were available  during my care of the patient were reviewed by me and considered in my medical decision making (see chart for details).  Clinical Course as of 07/09/21 1231  Thu Jul 09, 2021  1231 Counseled patient for approximately 3 minutes regarding smoking cessation. Discussed risks of smoking and how they applied and affected their visit here today. Patient not ready to quit at this time, however will follow up with their primary doctor when they are.   CPT code: [phone]: intermediate counseling for smoking cessation    [SG]    Clinical Course User Index [SG] Jeanell Sparrow, DO   MDM Rules/Calculators/A&P  CC: abd pain, n/v/diarrhea  This patient complains of above; this involves an extensive number of treatment options and is a complaint that carries with it a high risk of complications and morbidity. Vital signs were reviewed. Serious etiologies considered.  Abdomen is soft, not peritoneal Record review:   Previous records obtained and reviewed   Additional history obtained from spouse  Work up as above, notable for:  Labs & imaging results that were available during my care of the patient were reviewed by me and considered in my medical decision making.   I ordered imaging studies which included CTAP which is pending at time of handoff.  Management: Given gi cocktail, analgesics, IVF  Reassessment:  Pt reports improvement to her pain. Lipase was negative, pancreatitis is less likely. Gastritis vs possible ulcer is more likely, biliary colic is also on differential. Possibly viral syndrome.   If imaging negative and pt able to PO challenge/feeling better would be reasonable to discharge home, if unable to tolerate PO would favor admission for IVF rehydration and observation. Signed out to Dr Dina Rich at shift change. Care assumed by Dr Dina Rich at time of shift change.   This chart was dictated using voice recognition software.  Despite best efforts to proofread,   errors can occur which can change the documentation meaning.  Final Clinical Impression(s) / ED Diagnoses Final diagnoses:  Epigastric pain    Rx / DC Orders ED Discharge Orders     None        Jeanell Sparrow, DO 07/09/21 1614

## 2021-07-09 NOTE — Discharge Instructions (Addendum)
You have been seen and discharged from the emergency department.  Your blood work and CAT scan were unremarkable.  Follow-up with your primary provider and gastroenterologist for reevaluation and further care. Take home medications as prescribed. If you have any worsening symptoms or further concerns for your health please return to an emergency department for further evaluation.

## 2021-07-09 NOTE — ED Provider Notes (Signed)
Patient signed out to me by previous provider. Please refer to their note for full HPI.  Briefly this is a 65 year old female who presented to the emergency department with abdominal pain.  Blood work is reassuring, she has been treated symptomatically, we are pending CT imaging and reevaluation. Physical Exam  BP (!) 137/97   Pulse 98   Temp 98.8 F (37.1 C) (Oral)   Resp 18   SpO2 100%   Physical Exam Vitals and nursing note reviewed.  Constitutional:      General: She is not in acute distress.    Appearance: Normal appearance. She is not ill-appearing.  HENT:     Head: Normocephalic.     Mouth/Throat:     Mouth: Mucous membranes are moist.  Cardiovascular:     Rate and Rhythm: Normal rate.  Pulmonary:     Effort: Pulmonary effort is normal. No respiratory distress.  Abdominal:     Palpations: Abdomen is soft.     Tenderness: There is generalized abdominal tenderness. There is no guarding or rebound.  Skin:    General: Skin is warm.  Neurological:     Mental Status: She is alert and oriented to person, place, and time. Mental status is at baseline.  Psychiatric:        Mood and Affect: Mood normal.    ED Course/Procedures   Clinical Course as of 07/09/21 1705  Thu Jul 09, 2021  1231 Counseled patient for approximately 3 minutes regarding smoking cessation. Discussed risks of smoking and how they applied and affected their visit here today. Patient not ready to quit at this time, however will follow up with their primary doctor when they are.   CPT code: [phone]: intermediate counseling for smoking cessation    [SG]    Clinical Course User Index [SG] Jeanell Sparrow, DO    Procedures  MDM   CT of the abdomen pelvis does not identify any acute process.  On my reevaluation abdomen is soft, generally uncomfortable with palpation but nonacute.  She states that she feels better after her medications, has been able to p.o.  Plan for outpatient follow-up with her already  established GI.  Patient at this time appears safe and stable for discharge and will be treated as an outpatient.  Discharge plan and strict return to ED precautions discussed, patient verbalizes understanding and agreement.       Lorelle Gibbs, DO 07/09/21 1706

## 2021-07-09 NOTE — ED Triage Notes (Signed)
Abdominal pain with vomiting 

## 2021-07-09 NOTE — ED Notes (Signed)
Pt is a hard stick 

## 2021-07-09 NOTE — ED Notes (Signed)
Pt verbalized she talked with the Dr all that she needed and pt would update the sister after she left. Pt and female friend had no more questions at discharge.

## 2021-07-13 ENCOUNTER — Encounter (HOSPITAL_COMMUNITY): Payer: Self-pay

## 2021-07-13 ENCOUNTER — Other Ambulatory Visit: Payer: Self-pay

## 2021-07-13 ENCOUNTER — Inpatient Hospital Stay (HOSPITAL_COMMUNITY)
Admission: EM | Admit: 2021-07-13 | Discharge: 2021-07-16 | DRG: 368 | Disposition: A | Discharge status: Home Health | Payer: Medicare Other | Attending: Family Medicine | Admitting: Family Medicine

## 2021-07-13 ENCOUNTER — Observation Stay (HOSPITAL_COMMUNITY): Payer: Medicare Other

## 2021-07-13 ENCOUNTER — Emergency Department (HOSPITAL_COMMUNITY): Payer: Medicare Other

## 2021-07-13 DIAGNOSIS — R531 Weakness: Secondary | ICD-10-CM

## 2021-07-13 DIAGNOSIS — Z880 Allergy status to penicillin: Secondary | ICD-10-CM | POA: Diagnosis not present

## 2021-07-13 DIAGNOSIS — B3781 Candidal esophagitis: Principal | ICD-10-CM | POA: Diagnosis present

## 2021-07-13 DIAGNOSIS — R5381 Other malaise: Secondary | ICD-10-CM | POA: Diagnosis not present

## 2021-07-13 DIAGNOSIS — F17210 Nicotine dependence, cigarettes, uncomplicated: Secondary | ICD-10-CM | POA: Diagnosis not present

## 2021-07-13 DIAGNOSIS — G9341 Metabolic encephalopathy: Secondary | ICD-10-CM | POA: Diagnosis not present

## 2021-07-13 DIAGNOSIS — D649 Anemia, unspecified: Secondary | ICD-10-CM | POA: Diagnosis present

## 2021-07-13 DIAGNOSIS — E860 Dehydration: Secondary | ICD-10-CM | POA: Diagnosis present

## 2021-07-13 DIAGNOSIS — R112 Nausea with vomiting, unspecified: Secondary | ICD-10-CM

## 2021-07-13 DIAGNOSIS — Z20822 Contact with and (suspected) exposure to covid-19: Secondary | ICD-10-CM | POA: Diagnosis not present

## 2021-07-13 DIAGNOSIS — Z825 Family history of asthma and other chronic lower respiratory diseases: Secondary | ICD-10-CM | POA: Diagnosis not present

## 2021-07-13 DIAGNOSIS — R6510 Systemic inflammatory response syndrome (SIRS) of non-infectious origin without acute organ dysfunction: Secondary | ICD-10-CM

## 2021-07-13 DIAGNOSIS — K449 Diaphragmatic hernia without obstruction or gangrene: Secondary | ICD-10-CM | POA: Diagnosis not present

## 2021-07-13 DIAGNOSIS — R601 Generalized edema: Secondary | ICD-10-CM | POA: Diagnosis present

## 2021-07-13 DIAGNOSIS — K760 Fatty (change of) liver, not elsewhere classified: Secondary | ICD-10-CM | POA: Diagnosis not present

## 2021-07-13 DIAGNOSIS — K2210 Ulcer of esophagus without bleeding: Secondary | ICD-10-CM | POA: Diagnosis not present

## 2021-07-13 DIAGNOSIS — I10 Essential (primary) hypertension: Secondary | ICD-10-CM | POA: Diagnosis present

## 2021-07-13 DIAGNOSIS — R1011 Right upper quadrant pain: Secondary | ICD-10-CM

## 2021-07-13 DIAGNOSIS — K3189 Other diseases of stomach and duodenum: Secondary | ICD-10-CM | POA: Diagnosis not present

## 2021-07-13 DIAGNOSIS — K7030 Alcoholic cirrhosis of liver without ascites: Secondary | ICD-10-CM | POA: Diagnosis present

## 2021-07-13 DIAGNOSIS — Z807 Family history of other malignant neoplasms of lymphoid, hematopoietic and related tissues: Secondary | ICD-10-CM | POA: Diagnosis not present

## 2021-07-13 DIAGNOSIS — R1110 Vomiting, unspecified: Secondary | ICD-10-CM | POA: Diagnosis not present

## 2021-07-13 DIAGNOSIS — D539 Nutritional anemia, unspecified: Secondary | ICD-10-CM

## 2021-07-13 DIAGNOSIS — Z79899 Other long term (current) drug therapy: Secondary | ICD-10-CM

## 2021-07-13 DIAGNOSIS — R627 Adult failure to thrive: Secondary | ICD-10-CM | POA: Diagnosis present

## 2021-07-13 DIAGNOSIS — Z6822 Body mass index (BMI) 22.0-22.9, adult: Secondary | ICD-10-CM

## 2021-07-13 DIAGNOSIS — Z800 Family history of malignant neoplasm of digestive organs: Secondary | ICD-10-CM

## 2021-07-13 DIAGNOSIS — R110 Nausea: Secondary | ICD-10-CM | POA: Diagnosis not present

## 2021-07-13 DIAGNOSIS — Z96652 Presence of left artificial knee joint: Secondary | ICD-10-CM | POA: Diagnosis not present

## 2021-07-13 DIAGNOSIS — K766 Portal hypertension: Secondary | ICD-10-CM | POA: Diagnosis not present

## 2021-07-13 DIAGNOSIS — J9811 Atelectasis: Secondary | ICD-10-CM | POA: Diagnosis not present

## 2021-07-13 DIAGNOSIS — R634 Abnormal weight loss: Secondary | ICD-10-CM | POA: Diagnosis not present

## 2021-07-13 DIAGNOSIS — R109 Unspecified abdominal pain: Secondary | ICD-10-CM

## 2021-07-13 DIAGNOSIS — G319 Degenerative disease of nervous system, unspecified: Secondary | ICD-10-CM | POA: Diagnosis not present

## 2021-07-13 DIAGNOSIS — M797 Fibromyalgia: Secondary | ICD-10-CM | POA: Diagnosis present

## 2021-07-13 DIAGNOSIS — R140 Abdominal distension (gaseous): Secondary | ICD-10-CM | POA: Diagnosis not present

## 2021-07-13 DIAGNOSIS — B182 Chronic viral hepatitis C: Secondary | ICD-10-CM | POA: Diagnosis present

## 2021-07-13 DIAGNOSIS — Z8042 Family history of malignant neoplasm of prostate: Secondary | ICD-10-CM

## 2021-07-13 DIAGNOSIS — R000 Tachycardia, unspecified: Secondary | ICD-10-CM | POA: Diagnosis not present

## 2021-07-13 DIAGNOSIS — R29898 Other symptoms and signs involving the musculoskeletal system: Secondary | ICD-10-CM | POA: Diagnosis not present

## 2021-07-13 DIAGNOSIS — K7460 Unspecified cirrhosis of liver: Secondary | ICD-10-CM | POA: Diagnosis not present

## 2021-07-13 HISTORY — DX: Anemia, unspecified: D64.9

## 2021-07-13 LAB — URINALYSIS, ROUTINE W REFLEX MICROSCOPIC
Bacteria, UA: NONE SEEN
Bilirubin Urine: NEGATIVE
Glucose, UA: NEGATIVE mg/dL
Ketones, ur: NEGATIVE mg/dL
Leukocytes,Ua: NEGATIVE
Nitrite: NEGATIVE
Protein, ur: NEGATIVE mg/dL
Specific Gravity, Urine: 1.033 — ABNORMAL HIGH (ref 1.005–1.030)
pH: 5 (ref 5.0–8.0)

## 2021-07-13 LAB — CBC
HCT: 34.4 % — ABNORMAL LOW (ref 36.0–46.0)
Hemoglobin: 11.3 g/dL — ABNORMAL LOW (ref 12.0–15.0)
MCH: 32.9 pg (ref 26.0–34.0)
MCHC: 32.8 g/dL (ref 30.0–36.0)
MCV: 100.3 fL — ABNORMAL HIGH (ref 80.0–100.0)
Platelets: 297 10*3/uL (ref 150–400)
RBC: 3.43 MIL/uL — ABNORMAL LOW (ref 3.87–5.11)
RDW: 18.6 % — ABNORMAL HIGH (ref 11.5–15.5)
WBC: 12.4 10*3/uL — ABNORMAL HIGH (ref 4.0–10.5)
nRBC: 0 % (ref 0.0–0.2)

## 2021-07-13 LAB — TSH: TSH: 2.231 u[IU]/mL (ref 0.350–4.500)

## 2021-07-13 LAB — HEPATIC FUNCTION PANEL
ALT: 21 U/L (ref 0–44)
AST: 40 U/L (ref 15–41)
Albumin: 2.6 g/dL — ABNORMAL LOW (ref 3.5–5.0)
Alkaline Phosphatase: 103 U/L (ref 38–126)
Bilirubin, Direct: 0.3 mg/dL — ABNORMAL HIGH (ref 0.0–0.2)
Indirect Bilirubin: 0.7 mg/dL (ref 0.3–0.9)
Total Bilirubin: 1 mg/dL (ref 0.3–1.2)
Total Protein: 6.9 g/dL (ref 6.5–8.1)

## 2021-07-13 LAB — BASIC METABOLIC PANEL
Anion gap: 10 (ref 5–15)
BUN: 14 mg/dL (ref 8–23)
CO2: 18 mmol/L — ABNORMAL LOW (ref 22–32)
Calcium: 11 mg/dL — ABNORMAL HIGH (ref 8.9–10.3)
Chloride: 105 mmol/L (ref 98–111)
Creatinine, Ser: 1 mg/dL (ref 0.44–1.00)
GFR, Estimated: >60 mL/min (ref >60)
Glucose, Bld: 81 mg/dL (ref 70–99)
Potassium: 4 mmol/L (ref 3.5–5.1)
Sodium: 133 mmol/L — ABNORMAL LOW (ref 135–145)

## 2021-07-13 LAB — RESP PANEL BY RT-PCR (FLU A&B, COVID) ARPGX2
Influenza A by PCR: NEGATIVE
Influenza B by PCR: NEGATIVE
SARS Coronavirus 2 by RT PCR: NEGATIVE

## 2021-07-13 LAB — GLUCOSE, CAPILLARY
Glucose-Capillary: 50 mg/dL — ABNORMAL LOW (ref 70–99)
Glucose-Capillary: 80 mg/dL (ref 70–99)

## 2021-07-13 LAB — FOLATE: Folate: 5.8 ng/mL — ABNORMAL LOW (ref >5.9)

## 2021-07-13 LAB — LACTIC ACID, PLASMA: Lactic Acid, Venous: 1.8 mmol/L (ref 0.5–1.9)

## 2021-07-13 LAB — LIPASE, BLOOD: Lipase: 25 U/L (ref 11–51)

## 2021-07-13 LAB — PROCALCITONIN: Procalcitonin: 31.42 ng/mL

## 2021-07-13 LAB — ETHANOL: Alcohol, Ethyl (B): <10 mg/dL (ref <10)

## 2021-07-13 LAB — AMMONIA: Ammonia: <10 umol/L (ref 9–35)

## 2021-07-13 MED ORDER — PANTOPRAZOLE SODIUM 40 MG IV SOLR
40.0000 mg | INTRAVENOUS | Status: DC
  Administered 2021-07-13 – 2021-07-15 (×3): 40 mg via INTRAVENOUS
  Filled 2021-07-13 (×3): qty 40

## 2021-07-13 MED ORDER — ALLOPURINOL 100 MG PO TABS
300.0000 mg | ORAL_TABLET | Freq: Every day | ORAL | Status: DC
  Administered 2021-07-14 – 2021-07-16 (×2): 300 mg via ORAL
  Filled 2021-07-13 (×3): qty 3

## 2021-07-13 MED ORDER — BOOST / RESOURCE BREEZE PO LIQD CUSTOM
1.0000 | Freq: Three times a day (TID) | ORAL | Status: DC
  Administered 2021-07-13 – 2021-07-16 (×7): 1 via ORAL

## 2021-07-13 MED ORDER — SODIUM CHLORIDE 0.9 % IV BOLUS
500.0000 mL | Freq: Once | INTRAVENOUS | Status: AC
  Administered 2021-07-13: 500 mL via INTRAVENOUS

## 2021-07-13 MED ORDER — LORAZEPAM 2 MG/ML IJ SOLN
1.0000 mg | Freq: Once | INTRAMUSCULAR | Status: AC
  Administered 2021-07-13: 1 mg via INTRAVENOUS
  Filled 2021-07-13: qty 1

## 2021-07-13 MED ORDER — ONDANSETRON HCL 4 MG/2ML IJ SOLN
4.0000 mg | Freq: Once | INTRAMUSCULAR | Status: AC
  Administered 2021-07-13: 4 mg via INTRAVENOUS
  Filled 2021-07-13: qty 2

## 2021-07-13 MED ORDER — POLYETHYLENE GLYCOL 3350 17 G PO PACK: 17.0000 g | PACK | Freq: Every day | ORAL | Status: DC | PRN

## 2021-07-13 MED ORDER — FOLIC ACID 1 MG PO TABS
1.0000 mg | ORAL_TABLET | Freq: Every day | ORAL | Status: DC
  Administered 2021-07-14 – 2021-07-16 (×2): 1 mg via ORAL
  Filled 2021-07-13 (×3): qty 1

## 2021-07-13 MED ORDER — DEXTROSE-NACL 5-0.9 % IV SOLN: INTRAVENOUS | Status: DC

## 2021-07-13 MED ORDER — ONDANSETRON HCL 4 MG/2ML IJ SOLN
4.0000 mg | Freq: Four times a day (QID) | INTRAMUSCULAR | Status: DC | PRN
  Administered 2021-07-13 – 2021-07-15 (×4): 4 mg via INTRAVENOUS
  Filled 2021-07-13 (×4): qty 2

## 2021-07-13 MED ORDER — ONDANSETRON HCL 4 MG PO TABS
4.0000 mg | ORAL_TABLET | Freq: Four times a day (QID) | ORAL | Status: DC | PRN
  Administered 2021-07-14 – 2021-07-16 (×3): 4 mg via ORAL
  Filled 2021-07-13 (×3): qty 1

## 2021-07-13 MED ORDER — HYDRALAZINE HCL 25 MG PO TABS
50.0000 mg | ORAL_TABLET | Freq: Three times a day (TID) | ORAL | Status: DC
  Administered 2021-07-13 – 2021-07-15 (×3): 50 mg via ORAL
  Filled 2021-07-13 (×6): qty 2

## 2021-07-13 MED ORDER — LORAZEPAM 2 MG/ML IJ SOLN: 0.5000 mg | Freq: Once | INTRAMUSCULAR | Status: DC

## 2021-07-13 MED ORDER — ENOXAPARIN SODIUM 40 MG/0.4ML IJ SOSY
40.0000 mg | PREFILLED_SYRINGE | INTRAMUSCULAR | Status: DC
  Administered 2021-07-13 – 2021-07-15 (×3): 40 mg via SUBCUTANEOUS
  Filled 2021-07-13 (×3): qty 0.4

## 2021-07-13 MED ORDER — SODIUM CHLORIDE 0.9 % IV BOLUS: 500.0000 mL | Freq: Once | INTRAVENOUS | Status: DC

## 2021-07-13 MED ORDER — MEGESTROL ACETATE 40 MG PO TABS
40.0000 mg | ORAL_TABLET | Freq: Every day | ORAL | Status: DC
  Administered 2021-07-14 – 2021-07-16 (×2): 40 mg via ORAL
  Filled 2021-07-13 (×4): qty 1

## 2021-07-13 MED ORDER — IOHEXOL 300 MG/ML  SOLN
100.0000 mL | Freq: Once | INTRAMUSCULAR | Status: AC | PRN
  Administered 2021-07-13: 75 mL via INTRAVENOUS

## 2021-07-13 MED ORDER — THIAMINE HCL 100 MG/ML IJ SOLN
100.0000 mg | Freq: Every day | INTRAMUSCULAR | Status: DC
  Administered 2021-07-14 – 2021-07-16 (×2): 100 mg via INTRAVENOUS
  Filled 2021-07-13 (×3): qty 2

## 2021-07-13 MED ORDER — HALOPERIDOL LACTATE 5 MG/ML IJ SOLN
2.0000 mg | Freq: Once | INTRAMUSCULAR | Status: AC
  Administered 2021-07-13: 2 mg via INTRAVENOUS
  Filled 2021-07-13: qty 1

## 2021-07-13 MED ORDER — LABETALOL HCL 200 MG PO TABS
200.0000 mg | ORAL_TABLET | Freq: Two times a day (BID) | ORAL | Status: DC
  Administered 2021-07-13: 200 mg via ORAL
  Filled 2021-07-13: qty 1

## 2021-07-13 NOTE — ED Provider Notes (Signed)
Signout from Dr. Eulis Foster.  65 year old female here with multiple episodes of vomiting after eating.  Getting generally weak.  Plan is to follow-up on CT angio of abdomen and discussed with hospitalist. Physical Exam  BP (!) 129/107   Pulse (!) 114   Temp (!) 97.4 F (36.3 C) (Oral)   Resp 20   Ht 5\' 6"  (1.676 m)   Wt 63.5 kg   SpO2 99%   BMI 22.60 kg/m   Physical Exam  ED Course/Procedures   Clinical Course as of 07/13/21 1718  Mon Jul 13, 2021  1523 [name] daughter is now here and states that the patient is here for multiple problems including not eating, vomiting, not being able to walk, and getting worse since diagnosed with a stroke, 1 month ago.  According to the daughter she was diagnosed with a stroke and sent home.  The patient has not improved with the medication changes that Dr.Roark did.  She is apparently vomiting so frequently that she is not keeping medications down.  The daughter is interested in having the patient assess further follow, and possibly placed into rehab. [EW]  1551 Discussed the case with the GI PA, Ms. Boone.  She agrees with CT imaging, and her team will see the patient as a Optometrist as she is admitted for possible failure to thrive. [EW]    Clinical Course User Index [EW] Daleen Bo, MD    Procedures  MDM  CTA is negative for any acute occlusion.  Discussed with Dr. Denton Brick [facility] hospitalist who will evaluate the patient for admission.       Hayden Rasmussen, MD 07/14/21 1021

## 2021-07-13 NOTE — ED Provider Notes (Addendum)
[facility] EMERGENCY DEPARTMENT Provider Note   CSN: [id number] Arrival date & time: 07/13/21  0957     History Chief Complaint  Patient presents with  . Weight Loss  . Weakness    Colleen Lee is a 65 y.o. female.  HPI She is here for evaluation of ongoing vomiting, for 1 week, with general weakness.  She was evaluated for abdominal pain, in the ED, 07/09/2021, had comprehensive evaluation including CT of the abdomen, and labs.  At discharge she was advised to follow-up with her PCP, follow bland diet.  She states she has previously had similar problems.  She called her PCP today who advised her to come to the ED for evaluation.  She did seen any blood in her emesis.  She had a normal well-formed bowel movement today.  She does not feel that her abdomen is swollen.  Her abdomen is not hurting today.  She is taking sucralfate, which was prescribed on 05/09/2021. There are no other known active modifying factors.  Past Medical History:  Diagnosis Date  . Asthma   . Chronic abdominal pain   . Chronic back pain   . Chronic constipation   . Cirrhosis ([place])    Metavir score F4 on elastography 2015  . Cyclical vomiting   . Fibroids   . Fibromyalgia   . GERD (gastroesophageal reflux disease)   . H. pylori infection 2014   treated with pylera, had to be treated again as it was not eradicated. Urea breath test then negative after subsequent treatment.   . [name]    HCV RNA positive 09/2012  . Hypertension   . Marijuana use   . Nausea and vomiting    chronic, recurrent  . PONV (postoperative nausea and vomiting)    pt thinks maybe once she had N&V  . Sciatica of left side     Patient Active Problem List   Diagnosis Date Noted  . Pancreatitis, recurrent 05/17/2021  . Dysuria 09/16/2020  . Body mass index (BMI) 31.0-31.9, adult 01/07/2020  . Osteoarthritis of spine with radiculopathy, 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  . Acute pancreatitis 10/17/2019  . ETOH abuse 10/17/2019   . S/P right rotator cuff repair 01/30/19 02/06/2019  . Nontraumatic complete tear of right rotator cuff   . Arthritis of right acromioclavicular joint   . S/P total knee replacement, 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/24/2018  . Shoulder impingement, right 05/24/2018  . Dehydration   . Intractable cyclical vomiting 56/21/3086  . Pyloric stenosis in adult   . Thrombocytopenia ([place]) 12/20/2017  . Leukocytosis   . Malnutrition of moderate degree 12/17/2017  . Acute renal failure (ARF) ([place]) 12/15/2017  . Chronic abdominal pain 12/15/2017  . Gastroenteritis 06/14/2016  . Nausea with vomiting 06/10/2016  . Uterine enlargement 06/09/2016  . Intractable nausea and vomiting 06/08/2016  . Acute infective gastroenteritis 06/08/2016  . Diarrhea 06/08/2016  . Essential hypertension 06/08/2016  . GERD (gastroesophageal reflux disease) 06/08/2016  . Abdominal pain   . Endometrial polyp 04/15/2016  . PMB (postmenopausal bleeding) 02/27/2016  . Alcoholic cirrhosis of liver without ascites ([place])   . Asthma 01/21/2016  . Hepatic cirrhosis ([place]) 09/22/2015  . Arthritis of knee, degenerative 02/05/2015  . History of Helicobacter pylori infection 10/30/2014  . Lumbago with sciatica 07/01/2014  . Chronic [name] with cirrhosis ([place]) 04/11/2014  . De Quervain's disease (radial styloid tenosynovitis) 12/25/2013  . Anorexia 11/21/2012  . FH: colon cancer 11/21/2012  . Early satiety 10/25/2012  . Bowel habit changes 10/25/2012  .  Abdominal pain, epigastric 10/25/2012  . Abdominal bloating 10/25/2012  . Constipation 10/25/2012  . Abnormal weight loss 10/25/2012  . Chronic viral [name] ([place]) 10/25/2012  . Radicular pain of left lower extremity 09/28/2012  . Back pain 09/28/2012  . Sciatica 08/10/2011  . S/P arthroscopy of left knee 08/10/2011  . Tibial plateau fracture 08/10/2011  . Pain in joint, lower leg 02/12/2011  . Stiffness of joint, not elsewhere classified, lower leg 02/12/2011  . Pathological  dislocation 02/12/2011  . Meniscus, medial, derangement 12/29/2010  . CLOSED FRACTURE OF UPPER END OF TIBIA 08/12/2010    Past Surgical History:  Procedure Laterality Date  . BALLOON DILATION  12/20/2017   Procedure: BALLOON DILATION;  Surgeon: Danie Binder, MD;  Location: [facility];  Service: Endoscopy;;  pyloric dilation  . BIOPSY  12/20/2017   Procedure: BIOPSY;  Surgeon: Danie Binder, MD;  Location: [facility];  Service: Endoscopy;;  duodenal and gastric biopsy  . COLONOSCOPY  01/18/2008   GYJ:EHUDJS rectum.  Long redundant colon, a diminutive sigmoid polyp status post cold biopsy removed. Hyperplastic polyp. Repeat colonoscopy June 2014 due to family history of colon cancer  . COLONOSCOPY WITH ESOPHAGOGASTRODUODENOSCOPY (EGD) N/A 11/02/2012   HFW:YOVZCHYI gastric mucosa of doubtful, +H.pylori. Incomplete colonoscopy due to patient unable to tolerate exam, proximal colon seen. Patient refused ACBE.  Marland Kitchen COLONOSCOPY WITH PROPOFOL N/A 03/08/2013   Dr. Gala Romney: colonic polyp-removed as scribed above. Internal Hemorrhoids. Pathology did not reveal any colonic tissue, only mucus. SURVEILLANCE DUE Aug 2019  . COLONOSCOPY WITH PROPOFOL N/A 01/19/2018   Dr. Gala Romney: 6 mm cecal inflammatoy polyp, otherwise normal., Surveilance in 5 year s  . ESOPHAGEAL DILATION  12/20/2017   Procedure: ESOPHAGEAL DILATION;  Surgeon: Danie Binder, MD;  Location: [facility];  Service: Endoscopy;;  . ESOPHAGOGASTRODUODENOSCOPY  01/18/2008   RMR: Normal esophagus, normal  stomach  . ESOPHAGOGASTRODUODENOSCOPY (EGD) WITH PROPOFOL N/A 02/09/2016   Normal esophagus, small hiatal hernia, portal hypertensive gastropathy, normal second portion of duodenum. Due in July 2019  . ESOPHAGOGASTRODUODENOSCOPY (EGD) WITH PROPOFOL N/A 12/20/2017   Dr. Oneida Alar: benign appearing esophageal stenosis s/p dilation, mild pyloric stenosis s/p biopsy and dilation, mild duodenitis.   Marland Kitchen ESOPHAGOGASTRODUODENOSCOPY (EGD) WITH PROPOFOL N/A  06/12/2020   Rourk: Normal esophagus, portal hypertensive gastropathy, no specimens collected.  Tentatively plan for repeat EGD in 2 years.  Marland Kitchen HYSTEROSCOPY  05/11/2016   Procedure: HYSTEROSCOPY;  Surgeon: Jonnie Kind, MD;  Location: [facility];  Service: Gynecology;;  . KNEE SURGERY     left knee  . MULTIPLE EXTRACTIONS WITH ALVEOLOPLASTY N/A 10/15/2013   Procedure: MULTIPLE EXTRACTION WITH ALVEOLOPLASTY;  Surgeon: Gae Bon, DDS;  Location: [place];  Service: Oral Surgery;  Laterality: N/A;  . POLYPECTOMY N/A 03/08/2013   Procedure: POLYPECTOMY;  Surgeon: Daneil Dolin, MD;  Location: [facility];  Service: Endoscopy;  Laterality: N/A;  . POLYPECTOMY N/A 05/11/2016   Procedure: REMOVAL OF ENDOMETRIAL POLYP;  Surgeon: Jonnie Kind, MD;  Location: [facility];  Service: Gynecology;  Laterality: N/A;  . RESECTION DISTAL CLAVICAL Right 01/30/2019   Procedure: RESECTION DISTAL CLAVICAL;  Surgeon: Carole Civil, MD;  Location: [facility];  Service: Orthopedics;  Laterality: Right;  . SHOULDER OPEN ROTATOR CUFF REPAIR Right 01/30/2019   Procedure: ROTATOR CUFF REPAIR SHOULDER OPEN;  Surgeon: Carole Civil, MD;  Location: [facility];  Service: Orthopedics;  Laterality: Right;  pt to arrive at 7:30 for PICC at 8:00  . TOTAL  KNEE ARTHROPLASTY Left 02/05/2015   Procedure: LEFT TOTAL KNEE ARTHROPLASTY;  Surgeon: Carole Civil, MD;  Location: [facility];  Service: Orthopedics;  Laterality: Left;     OB History     Gravida      Para      Term      Preterm      AB      Living  1      SAB      IAB      Ectopic      Multiple      Live Births              Family History  Problem Relation Age of Onset  . Colon cancer Brother 39          . Multiple myeloma Brother   . Liver cancer Sister   . Prostate cancer Brother   . Pancreatic cancer Brother   . Cancer Mother        breast  . Asthma Mother     Social History   Tobacco Use  . Smoking status: Every Day    Packs/day: 1.00     Years: 40.00    Pack years: 40.00    Types: Cigarettes  . Smokeless tobacco: Never  . Tobacco comments:    Smokes one pack of cigarettes every 1.5 days  Vaping Use  . Vaping Use: Never used  Substance Use Topics  . Alcohol use: Not Currently    Comment: 1 bottle of dinner wine daily; 06/30/21 stopped drinking  . Drug use: Not Currently    Types: Marijuana    Comment: history of marijuana in past- almost 2 years ago    Home Medications Prior to Admission medications   Medication Sig Start Date End Date Taking? Authorizing Provider  albuterol (PROVENTIL HFA;VENTOLIN HFA) 108 (90 Base) MCG/ACT inhaler Inhale 2 puffs into the lungs every 6 (six) hours as needed for wheezing or shortness of breath.     [provider]  allopurinol (ZYLOPRIM) 300 MG tablet Take 300 mg by mouth daily. 04/15/21   [provider]  cyclobenzaprine (FLEXERIL) 5 MG tablet Take 5 mg by mouth 3 (three) times daily. 04/15/21   [provider]  folic acid (FOLVITE) 1 MG tablet Take 1 tablet (1 mg total) by mouth daily. 01/06/21   Barton Dubois, MD  gabapentin (NEURONTIN) 100 MG capsule Take 1 capsule (100 mg total) by mouth 3 (three) times daily. Patient taking differently: Take 100 mg by mouth daily. 12/22/18   Carole Civil, MD  hydrALAZINE (APRESOLINE) 50 MG tablet Take 1 tablet (50 mg total) by mouth every 8 (eight) hours. 01/30/18   Barton Dubois, MD  HYDROcodone-acetaminophen (NORCO/VICODIN) 5-325 MG tablet Take 1 tablet by mouth every 6 (six) hours as needed. 06/08/21   Noemi Chapel, MD  hydrOXYzine (ATARAX/VISTARIL) 10 MG tablet Take 10 mg by mouth daily as needed. 11/30/17   [provider]  labetalol (NORMODYNE) 100 MG tablet Take 2 tablets (200 mg total) by mouth 2 (two) times daily. Patient taking differently: Take 100 mg by mouth in the morning. 01/30/18   Barton Dubois, MD  lactulose (CHRONULAC) 10 GM/15ML solution Take 30 mLs (20 g total) by mouth 2 (two) times daily  as needed for mild constipation or moderate constipation. Patient not taking: Reported on 07/09/2021 05/29/21   Daleen Bo, MD  megestrol (MEGACE) 40 MG tablet Take 40 mg by mouth daily. 04/02/21   [provider]  mupirocin ointment (BACTROBAN) 2 % Apply topically 2 (two) times daily. 06/18/21   [provider]  naloxone [facility]) nasal spray 4 mg/0.1 mL See admin instructions. ADMINISTER A SINGLE SPRAYEOF NARCAN INTO ONECNOSTRIL. REPEAT AFTER 3SMINUTES IN OTHER NOSTRIL IF NO RESPONSE. 10/03/20   [provider]  ondansetron (ZOFRAN ODT) 4 MG disintegrating tablet 4mg  ODT q4 hours prn nausea/vomit 05/11/21   Milton Ferguson, MD  ondansetron (ZOFRAN) 4 MG tablet Take 4 mg by mouth. Patient not taking: Reported on 07/09/2021 03/10/21   [provider]  pantoprazole (PROTONIX) 40 MG tablet Take 1 tablet (40 mg total) by mouth 2 (two) times daily. 06/30/21 06/30/22  Rourk, Cristopher Estimable, MD  Plecanatide (TRULANCE) 3 MG TABS Take 3 mg by mouth daily. Patient not taking: Reported on 07/09/2021 06/30/21 06/30/22  Daneil Dolin, MD  polyethylene glycol (GOLYTELY) 236 g solution Drink 8 ounces ( 1cup) of solution every hour until you have a bowel movement. Patient not taking: Reported on 06/30/2021 05/31/21   Evalee Jefferson, PA-C  polyethylene glycol (MIRALAX) 17 g packet Take 17 g by mouth daily as needed for mild constipation. Patient not taking: Reported on 07/09/2021 01/05/21   Barton Dubois, MD  potassium chloride (MICRO-K) 10 MEQ CR capsule Take 20 mEq by mouth daily. Patient not taking: Reported on 07/09/2021 05/30/21   [provider]  prednisoLONE acetate (PRED FORTE) 1 % ophthalmic suspension Place 1 drop into both eyes 2 (two) times daily. 03/10/21   [provider]  sucralfate (CARAFATE) 1 g tablet Take 1 tablet (1 g total) by mouth 4 (four) times daily -  with meals and at bedtime for 7 days. 07/09/21 07/16/21  Wynona Dove A, DO  thiamine 100 MG tablet Take  1 tablet (100 mg total) by mouth daily. 01/06/21   Barton Dubois, MD    Allergies    Penicillins  Review of Systems   Review of Systems  All other systems reviewed and are negative.  Physical Exam Updated Vital Signs BP (!) 140/98   Pulse (!) 111   Temp (!) 97.4 F (36.3 C) (Oral)   Resp 15   Ht 5\' 6"  (1.676 m)   Wt 63.5 kg   SpO2 98%   BMI 22.60 kg/m   Physical Exam Vitals and nursing note reviewed.  Constitutional:      Appearance: She is well-developed. She is not ill-appearing.  HENT:     Head: Normocephalic and atraumatic.     Right Ear: External ear normal.     Left Ear: External ear normal.     Nose: No congestion or rhinorrhea.     Mouth/Throat:     Mouth: Mucous membranes are dry.     Pharynx: No oropharyngeal exudate or posterior oropharyngeal erythema.  Eyes:     Conjunctiva/sclera: Conjunctivae normal.     Pupils: Pupils are equal, round, and reactive to light.  Neck:     Trachea: Phonation normal.  Cardiovascular:     Rate and Rhythm: Normal rate and regular rhythm.     Heart sounds: Normal heart sounds.  Pulmonary:     Effort: Pulmonary effort is normal.     Breath sounds: Normal breath sounds.  Abdominal:     General: There is distension.     Palpations: Abdomen is soft. There is no mass.     Hernia: No hernia is present.  Musculoskeletal:        General: Normal range of motion.     Cervical back: Normal  range of motion and neck supple.  Skin:    General: Skin is warm and dry.  Neurological:     Mental Status: She is alert and oriented to person, place, and time.     Cranial Nerves: No cranial nerve deficit.     Sensory: No sensory deficit.     Motor: No abnormal muscle tone.     Coordination: Coordination normal.  Psychiatric:        Mood and Affect: Mood normal.        Behavior: Behavior normal.        Thought Content: Thought content normal.        Judgment: Judgment normal.    ED Results / Procedures / Treatments   Labs (all labs  ordered are listed, but only abnormal results are displayed) Labs Reviewed  BASIC METABOLIC PANEL - Abnormal; Notable for the following components:      Result Value   Sodium 133 (*)    CO2 18 (*)    Calcium 11.0 (*)    All other components within normal limits  CBC - Abnormal; Notable for the following components:   WBC 12.4 (*)    RBC 3.43 (*)    Hemoglobin 11.3 (*)    HCT 34.4 (*)    MCV 100.3 (*)    RDW 18.6 (*)    All other components within normal limits  RESP PANEL BY RT-PCR (FLU A&B, COVID) ARPGX2  LIPASE, BLOOD  URINALYSIS, ROUTINE W REFLEX MICROSCOPIC    EKG EKG Interpretation  Date/Time:  Monday July 13 2021 11:19:06 EST Ventricular Rate:  115 PR Interval:  134 QRS Duration: 84 QT Interval:  310 QTC Calculation: 429 R Axis:   69 Text Interpretation: Sinus tachycardia Consider left ventricular hypertrophy Since last tracing of earlier today No old tracing to compare No significant change was found Confirmed by Daleen Bo [phone]) on 07/13/2021 2:07:41 PM  Radiology No results found.  Procedures Procedures   Medications Ordered in ED Medications  sodium chloride 0.9 % bolus 500 mL (0 mLs Intravenous Stopped 07/13/21 1354)  ondansetron (ZOFRAN) injection 4 mg (4 mg Intravenous Given 07/13/21 1148)  LORazepam (ATIVAN) injection 1 mg (1 mg Intravenous Given 07/13/21 1148)  haloperidol lactate (HALDOL) injection 2 mg (2 mg Intravenous Given 07/13/21 1148)    ED Course  I have reviewed the triage vital signs and the nursing notes.  Pertinent labs & imaging results that were available during my care of the patient were reviewed by me and considered in my medical decision making (see chart for details).  Clinical Course as of 07/13/21 1551  Mon Jul 13, 2021  1523 [name] daughter is now here and states that the patient is here for multiple problems including not eating, vomiting, not being able to walk, and getting worse since diagnosed with a stroke,  1 month ago.  According to the daughter she was diagnosed with a stroke and sent home.  The patient has not improved with the medication changes that Dr.Roark did.  She is apparently vomiting so frequently that she is not keeping medications down.  The daughter is interested in having the patient assess further follow, and possibly placed into rehab. [EW]  1551 Discussed the case with the GI PA, Ms. Boone.  She agrees with CT imaging, and her team will see the patient as a Optometrist as she is admitted for possible failure to thrive. [EW]    Clinical Course User Index [EW] Daleen Bo, MD   MDM  Rules/Calculators/A&P                            Patient Vitals for the past 24 hrs:  BP Temp Temp src Pulse Resp SpO2 Height Weight  07/13/21 1500 (!) 140/98 -- -- (!) 111 15 98 % -- --  07/13/21 1430 (!) 148/100 -- -- (!) 111 13 99 % -- --  07/13/21 1319 (!) 145/96 -- -- (!) 111 12 99 % -- --  07/13/21 1230 (!) 135/96 -- -- -- 11 -- -- --  07/13/21 1150 (!) 151/104 -- -- (!) 108 13 100 % -- --  07/13/21 1100 (!) 122/96 -- -- (!) 119 14 100 % -- --  07/13/21 1049 (!) 142/101 (!) 97.4 F (36.3 C) Oral (!) 126 18 97 % $Re'5\' 6"'cys$  (1.676 m) 63.5 kg    3:03 PM Reevaluation with update and discussion. After initial assessment and treatment, an updated evaluation reveals at this time she is sleepy but arousable and cooperative.  She states that she is "vomiting a little."  I discussed findings with her.  Family members not in the room at this time. Daleen Bo   Medical Decision Making:  This patient is presenting for evaluation of vomiting, which does require a range of treatment options, and is a complaint that involves a moderate risk of morbidity and mortality. The differential diagnoses include bowel obstruction, pancreatitis, nonspecific intestinal disorder. I decided to review old records, and in summary middle-aged female with ongoing tobacco abuse, presenting with chronic abdominal pain, chronic  back pain, chronic constipation and GERD symptoms.  I obtained additional historical information from husband at bedside.  Clinical Laboratory Tests Ordered, included CBC, Metabolic panel, Urinalysis, and lipase . Review indicates normal except sodium low, CO2 low, calcium high, white count high, hemoglobin low, hematocrit low, MCV high.  Critical Interventions-clinical evaluation, laboratory testing, medication treatment, observation and reassessment  After These Interventions, the Patient was reevaluated and was found with recurrent nausea and vomiting.  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  She was started on sucralfate, recently.  She recently saw her GI doctor who increased her Protonix to 40 mg twice a day, on 06/30/2021.  At that time her Trulance was also restarted to treat IBS.  Today, white count is mildly elevated, hemoglobin slightly low, sodium low, calcium is high.  Patient daughter alleges that the patient has had a stroke.  There is no indication that she has been diagnosed with that or had imaging to indicate that.  CRITICAL CARE-no Performed by: Daleen Bo  Nursing Notes Reviewed/ Care Coordinated Applicable Imaging Reviewed Interpretation of Laboratory Data incorporated into ED treatment   Plan CTA abdomen pelvis ordered to evaluate for recurrence of SMA syndrome, possibly contributing to nausea, vomiting abdominal discomfort.  Plan-reevaluation after CTA imaging, and possible GI consultation.  She will may require hospitalization for failure to thrive.  Final Clinical Impression(s) / ED Diagnoses Final diagnoses:  Nausea and vomiting, unspecified vomiting type  Weakness  Malaise    Rx / DC Orders ED Discharge Orders     None         Daleen Bo, MD 07/13/21 1526

## 2021-07-13 NOTE — H&P (Addendum)
History and Physical    Colleen Lee ZOX:[id number] DOB: [birth year] DOA: 07/13/2021  PCP: Sandi Mariscal, MD   Patient coming from: Home  I have personally briefly reviewed patient's old medical records in [place]  Chief Complaint: Vomiting, abdominal pain  HPI: Colleen Lee is a 65 y.o. female with medical history significant for alcoholic liver cirrhosis, asthma, [name]. Patient was brought to the ED with reports of generalized weakness and vomiting.  For about a week.  At the time of my evaluation patient appears lethargic, is able to answer questions, sister Lattie Haw at bedside and assist with the history.  Patient lives with her husband.   Sister Lattie Haw reports that since patient was diagnosed with a stroke about 2 months ago, patient has declined.  She reports patient had abnormal gait, prompting work-up for which she was diagnosed with a stroke.  She was initially able to walk, but over the past 2 weeks patient has not been able to walk.  And he noticed that her grip on the left upper arm was weaker than her right.  She has had poor oral intake.  [name] speech has also been sluggish, but they are able to understand what she says.  Patient tells me she has upper abdominal pain. Sister denies any alcohol intake.  No cough no difficulty breathing noted.  Patient denies back or neck pain.  ED Course: Temperature 97.5.  Heart rate 104-126.  Respiratory rate 12-18.  Blood pressure systolic 811 -914.  O2 sats 99-100 on room air.  WBC 12.4.  Stable creatinine.  CT abdomen and pelvis was obtained, was negative for any acute findings within the abdomen and pelvis. GI was consulted, recommended admission, supportive care for now.  May need to consider endoscopy evaluation when she stabilizes from a mentation standpoint. 100 mill bolus given.  Hospitalist to admit.  Review of Systems: As per HPI all other systems reviewed and negative.  Past Medical History:  Diagnosis Date   Asthma     Chronic abdominal pain    Chronic back pain    Chronic constipation    Cirrhosis (HCC)    Metavir score F4 on elastography 7829   Cyclical vomiting    Fibroids    Fibromyalgia    GERD (gastroesophageal reflux disease)    H. pylori infection 2014   treated with pylera, had to be treated again as it was not eradicated. Urea breath test then negative after subsequent treatment.    [name]    HCV RNA positive 09/2012   Hypertension    Marijuana use    Nausea and vomiting    chronic, recurrent   PONV (postoperative nausea and vomiting)    pt thinks maybe once she had N&V   Sciatica of left side     Past Surgical History:  Procedure Laterality Date   BALLOON DILATION  12/20/2017   Procedure: BALLOON DILATION;  Surgeon: Danie Binder, MD;  [facility];  Service: Endoscopy;;  pyloric dilation   BIOPSY  12/20/2017   Procedure: BIOPSY;  Surgeon: Danie Binder, MD;  [facility];  Service: Endoscopy;;  duodenal and gastric biopsy   COLONOSCOPY  01/18/2008   FAO:ZHYQMV rectum.  Long redundant colon, a diminutive sigmoid polyp status post cold biopsy removed. Hyperplastic polyp. Repeat colonoscopy June 2014 due to family history of colon cancer   COLONOSCOPY WITH ESOPHAGOGASTRODUODENOSCOPY (EGD) N/A 11/02/2012   HQI:ONGEXBMW gastric mucosa of doubtful, +H.pylori. Incomplete colonoscopy due to  patient unable to tolerate exam, proximal colon seen. Patient refused ACBE.   COLONOSCOPY WITH PROPOFOL N/A 03/08/2013   Dr. Gala Romney: colonic polyp-removed as scribed above. Internal Hemorrhoids. Pathology did not reveal any colonic tissue, only mucus. SURVEILLANCE DUE Aug 2019   COLONOSCOPY WITH PROPOFOL N/A 01/19/2018   Dr. Gala Romney: 6 mm cecal inflammatoy polyp, otherwise normal., Surveilance in 5 year s   ESOPHAGEAL DILATION  12/20/2017   Procedure: ESOPHAGEAL DILATION;  Surgeon: Danie Binder, MD;  [facility];  Service: Endoscopy;;   ESOPHAGOGASTRODUODENOSCOPY   01/18/2008   RMR: Normal esophagus, normal  stomach   ESOPHAGOGASTRODUODENOSCOPY (EGD) WITH PROPOFOL N/A 02/09/2016   Normal esophagus, small hiatal hernia, portal hypertensive gastropathy, normal second portion of duodenum. Due in July 2019   ESOPHAGOGASTRODUODENOSCOPY (EGD) WITH PROPOFOL N/A 12/20/2017   Dr. Oneida Alar: benign appearing esophageal stenosis s/p dilation, mild pyloric stenosis s/p biopsy and dilation, mild duodenitis.    ESOPHAGOGASTRODUODENOSCOPY (EGD) WITH PROPOFOL N/A 06/12/2020   Rourk: Normal esophagus, portal hypertensive gastropathy, no specimens collected.  Tentatively plan for repeat EGD in 2 years.   HYSTEROSCOPY  05/11/2016   Procedure: HYSTEROSCOPY;  Surgeon: Jonnie Kind, MD;  Location: [facility];  Service: Gynecology;;   KNEE SURGERY     left knee   MULTIPLE EXTRACTIONS WITH ALVEOLOPLASTY N/A 10/15/2013   Procedure: MULTIPLE EXTRACTION WITH ALVEOLOPLASTY;  Surgeon: Gae Bon, DDS;  Location: [facility];  Service: Oral Surgery;  Laterality: N/A;   POLYPECTOMY N/A 03/08/2013   Procedure: POLYPECTOMY;  Surgeon: Daneil Dolin, MD;  Location: [facility];  Service: Endoscopy;  Laterality: N/A;   POLYPECTOMY N/A 05/11/2016   Procedure: REMOVAL OF ENDOMETRIAL POLYP;  Surgeon: Jonnie Kind, MD;  Location: [facility];  Service: Gynecology;  Laterality: N/A;   RESECTION DISTAL CLAVICAL Right 01/30/2019   Procedure: RESECTION DISTAL CLAVICAL;  Surgeon: Carole Civil, MD;  Location: [facility];  Service: Orthopedics;  Laterality: Right;   SHOULDER OPEN ROTATOR CUFF REPAIR Right 01/30/2019   Procedure: ROTATOR CUFF REPAIR SHOULDER OPEN;  Surgeon: Carole Civil, MD;  Location: [facility];  Service: Orthopedics;  Laterality: Right;  pt to arrive at 7:30 for PICC at 8:00   [place] Left 02/05/2015   Procedure: LEFT TOTAL KNEE ARTHROPLASTY;  Surgeon: Carole Civil, MD;  Location: [facility];  Service: Orthopedics;  Laterality: Left;     reports that she has been smoking  cigarettes. She has a 40.00 pack-year smoking history. She has never used smokeless tobacco. She reports that she does not currently use alcohol. She reports that she does not currently use drugs after having used the following drugs: Marijuana.  Allergies  Allergen Reactions   Penicillins Rash    Did it involve swelling of the face/tongue/throat, SOB, or low BP? No Did it involve sudden or severe rash/hives, skin peeling, or any reaction on the inside of your mouth or nose? No Did you need to seek medical attention at a hospital or doctor's office? No When did it last happen? Many years ago      If all above answers are "NO", may proceed with cephalosporin use.      Family History  Problem Relation Age of Onset   Colon cancer Brother 17           Multiple myeloma Brother    Liver cancer Sister    Prostate cancer Brother    Pancreatic cancer Brother    Cancer Mother  breast   Asthma Mother     Prior to Admission medications   Medication Sig Start Date End Date Taking? Authorizing Provider  albuterol (PROVENTIL HFA;VENTOLIN HFA) 108 (90 Base) MCG/ACT inhaler Inhale 2 puffs into the lungs every 6 (six) hours as needed for wheezing or shortness of breath.     [provider]  allopurinol (ZYLOPRIM) 300 MG tablet Take 300 mg by mouth daily. 04/15/21   [provider]  cyclobenzaprine (FLEXERIL) 5 MG tablet Take 5 mg by mouth 3 (three) times daily. 04/15/21   [provider]  folic acid (FOLVITE) 1 MG tablet Take 1 tablet (1 mg total) by mouth daily. 01/06/21   Barton Dubois, MD  gabapentin (NEURONTIN) 100 MG capsule Take 1 capsule (100 mg total) by mouth 3 (three) times daily. Patient taking differently: Take 100 mg by mouth daily. 12/22/18   Carole Civil, MD  hydrALAZINE (APRESOLINE) 50 MG tablet Take 1 tablet (50 mg total) by mouth every 8 (eight) hours. 01/30/18   Barton Dubois, MD  HYDROcodone-acetaminophen (NORCO/VICODIN) 5-325 MG tablet Take 1  tablet by mouth every 6 (six) hours as needed. 06/08/21   Noemi Chapel, MD  hydrOXYzine (ATARAX/VISTARIL) 10 MG tablet Take 10 mg by mouth daily as needed. 11/30/17   [provider]  labetalol (NORMODYNE) 100 MG tablet Take 2 tablets (200 mg total) by mouth 2 (two) times daily. Patient taking differently: Take 100 mg by mouth in the morning. 01/30/18   Barton Dubois, MD  lactulose (CHRONULAC) 10 GM/15ML solution Take 30 mLs (20 g total) by mouth 2 (two) times daily as needed for mild constipation or moderate constipation. Patient not taking: Reported on 07/09/2021 05/29/21   Daleen Bo, MD  megestrol (MEGACE) 40 MG tablet Take 40 mg by mouth daily. 04/02/21   [provider]  mupirocin ointment (BACTROBAN) 2 % Apply topically 2 (two) times daily. 06/18/21   [provider]  naloxone [facility]) nasal spray 4 mg/0.1 mL See admin instructions. ADMINISTER A SINGLE SPRAYEOF NARCAN INTO ONECNOSTRIL. REPEAT AFTER 3SMINUTES IN OTHER NOSTRIL IF NO RESPONSE. 10/03/20   [provider]  ondansetron (ZOFRAN ODT) 4 MG disintegrating tablet 4mg  ODT q4 hours prn nausea/vomit 05/11/21   Milton Ferguson, MD  ondansetron (ZOFRAN) 4 MG tablet Take 4 mg by mouth. Patient not taking: Reported on 07/09/2021 03/10/21   [provider]  pantoprazole (PROTONIX) 40 MG tablet Take 1 tablet (40 mg total) by mouth 2 (two) times daily. 06/30/21 06/30/22  Rourk, Cristopher Estimable, MD  Plecanatide (TRULANCE) 3 MG TABS Take 3 mg by mouth daily. Patient not taking: Reported on 07/09/2021 06/30/21 06/30/22  Daneil Dolin, MD  polyethylene glycol (GOLYTELY) 236 g solution Drink 8 ounces ( 1cup) of solution every hour until you have a bowel movement. Patient not taking: Reported on 06/30/2021 05/31/21   Evalee Jefferson, PA-C  polyethylene glycol (MIRALAX) 17 g packet Take 17 g by mouth daily as needed for mild constipation. Patient not taking: Reported on 07/09/2021 01/05/21   Barton Dubois, MD  potassium  chloride (MICRO-K) 10 MEQ CR capsule Take 20 mEq by mouth daily. Patient not taking: Reported on 07/09/2021 05/30/21   [provider]  prednisoLONE acetate (PRED FORTE) 1 % ophthalmic suspension Place 1 drop into both eyes 2 (two) times daily. 03/10/21   [provider]  sucralfate (CARAFATE) 1 g tablet Take 1 tablet (1 g total) by mouth 4 (four) times daily -  with meals and at bedtime for 7  days. 07/09/21 07/16/21  Wynona Dove A, DO  thiamine 100 MG tablet Take 1 tablet (100 mg total) by mouth daily. 01/06/21   Barton Dubois, MD    Physical Exam: Vitals:   07/13/21 1530 07/13/21 1630 07/13/21 1700 07/13/21 1730  BP: 118/84 (!) 146/94 (!) 129/107 (!) 159/103  Pulse: (!) 114 (!) 109 (!) 114 (!) 112  Resp: $Remo'15 12 20 15  'TzGDU$ Temp:    98.4 F (36.9 C)  TempSrc:      SpO2: 98% 98% 99% 100%  Weight:      Height:        Constitutional: Acutely ill-appearing, lethargic Vitals:   07/13/21 1530 07/13/21 1630 07/13/21 1700 07/13/21 1730  BP: 118/84 (!) 146/94 (!) 129/107 (!) 159/103  Pulse: (!) 114 (!) 109 (!) 114 (!) 112  Resp: $Remo'15 12 20 15  'YQuaq$ Temp:    98.4 F (36.9 C)  TempSrc:      SpO2: 98% 98% 99% 100%  Weight:      Height:       Eyes: PERRL, lids and conjunctivae normal ENMT: Mucous membranes are very dry.  Neck: normal, supple, no masses, no thyromegaly Respiratory: clear to auscultation bilaterally, no wheezing, no crackles. Normal respiratory effort. No accessory muscle use.  Cardiovascular: Tachycardic, regular rate and rhythm, no murmurs / rubs / gallops. No extremity edema. 2+ pedal pulses.   Abdomen: no tenderness, no masses palpated. No hepatosplenomegaly. Bowel sounds positive.  Musculoskeletal: no clubbing / cyanosis. No joint deformity upper and lower extremities. Good ROM, no contractures. Normal muscle tone.  Skin: no rashes, lesions, ulcers. No induration Neurologic: Sluggish responses to questions, speech not quite slurred and not garbled.  Able to  understand patient's speech.  No facial asymmetry.  Reduced grip strength on the left compared to the right upper extremity.  3/5 strength bilateral lower extremity.  Equivocal response to sensation evaluation. Psychiatric: Awake but lethargic.  Oriented to person and place.  Labs on Admission: I have personally reviewed following labs and imaging studies  CBC: Recent Labs  Lab 07/09/21 1350 07/13/21 1130  WBC 9.9 12.4*  NEUTROABS 8.8*  --   HGB 11.5* 11.3*  HCT 34.4* 34.4*  MCV 100.0 100.3*  PLT 340 048   Basic Metabolic Panel: Recent Labs  Lab 07/09/21 1350 07/13/21 1130  NA 136 133*  K 4.5 4.0  CL 108 105  CO2 19* 18*  GLUCOSE 80 81  BUN 13 14  CREATININE 0.87 1.00  CALCIUM 11.0* 11.0*   GFR: Estimated Creatinine Clearance: 52.5 mL/min (by C-G formula based on SCr of 1 mg/dL). Liver Function Tests: Recent Labs  Lab 07/09/21 1350  AST 46*  ALT 22  ALKPHOS 126  BILITOT 1.0  PROT 7.4  ALBUMIN 2.7*   Recent Labs  Lab 07/09/21 1350 07/13/21 1113  LIPASE 26 25   Recent Labs  Lab 07/13/21 1717  AMMONIA <10   Urine analysis:    Component Value Date/Time   COLORURINE YELLOW 07/09/2021 [place] 07/09/2021 1545   LABSPEC <1.005 (L) 07/09/2021 1545   PHURINE 5.5 07/09/2021 1545   GLUCOSEU NEGATIVE 07/09/2021 1545   HGBUR NEGATIVE 07/09/2021 1545   BILIRUBINUR NEGATIVE 07/09/2021 1545   KETONESUR NEGATIVE 07/09/2021 1545   PROTEINUR NEGATIVE 07/09/2021 1545   UROBILINOGEN 0.2 04/01/2015 1140   NITRITE NEGATIVE 07/09/2021 1545   LEUKOCYTESUR NEGATIVE 07/09/2021 1545    Radiological Exams on Admission: CT Angio Abd/Pel W and/or Wo Contrast  Result Date: 07/13/2021 CLINICAL DATA:  Nausea, weakness and weight loss for the past week. Concern for acute mesenteric ischemia. EXAM: CTA ABDOMEN AND PELVIS WITHOUT AND WITH CONTRAST TECHNIQUE: Multidetector CT imaging of the abdomen and pelvis was performed using the standard protocol during  bolus administration of intravenous contrast. Multiplanar reconstructed images and MIPs were obtained and reviewed to evaluate the vascular anatomy. CONTRAST:  76mL OMNIPAQUE IOHEXOL 300 MG/ML  SOLN COMPARISON:  CT abdomen pelvis-07/09/2021; 05/31/2021; 05/11/2021 FINDINGS: VASCULAR Aorta: There is a moderate amount predominantly calcified atherosclerotic plaque within normal caliber abdominal aorta, not resulting in a hemodynamically significant stenosis. No abdominal aortic dissection or periaortic stranding. Celiac: Widely patent without a hemodynamically significant narrowing. Conventional branching pattern. SMA: Widely patent without hemodynamically significant narrowing. Conventional branching pattern. The distal tributaries the SMA appear widely patent without discrete lumen filling defect to suggest distal embolism. Renals: Solitary bilaterally; there is a large amount of eccentric noncalcified atherosclerotic plaque involving the origin of the left renal artery resulting in short segment severe (greater 75%) luminal narrowing (axial image 74, series 4) and while this finding may be associated with slight asymmetric left-sided renal atrophy, there is no definitive delayed renal enhancement. The right renal artery is widely patent without hemodynamically significant narrowing. No vessel irregularity to suggest FMD. 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 Inflow: There is a minimal amount of eccentric calcified and noncalcified atherosclerotic plaque involving the bilateral common iliac arteries, not resulting in a hemodynamically significant stenosis. The bilateral internal iliac arteries are mildly disease though patent and of normal caliber. The bilateral external iliac arteries are of normal caliber and widely patent without hemodynamically significant narrowing. Proximal Outflow: There is a minimal amount of eccentric mixed calcified and noncalcified  atherosclerotic plaque involving the bilateral common femoral arteries, not resulting in a hemodynamically significant stenosis. Eccentric calcified and noncalcified atherosclerotic plaque approaches 50% luminal narrowing involving the proximal aspects of the bilateral superficial femoral arteries (left image 204, series 4; right image 207, series 4). Veins: The IVC and pelvic venous systems appear patent. Review of the MIP images confirms the above findings. _________________________________________________________ NON-VASCULAR Lower chest: Limited visualization of the lower thorax demonstrates minimal subpleural ground-glass atelectasis. No discrete focal airspace opacities. No pleural effusion. Normal heart size.  No pericardial effusion. Hepatobiliary: There is diffuse decreased attenuation of the hepatic parenchyma compatible with hepatic steatosis. No discrete hyperenhancing hepatic lesions. Normal early arterial phase appearance of the gallbladder given degree distention. No radiopaque gallstones. No intra or extrahepatic biliary duct dilatation. No ascites. Pancreas: The pancreas appears somewhat atretic but without definitive evidence of peripancreatic stranding on this early arterial phase examination. Spleen: Normal appearance of the spleen. No evidence of splenomegaly. Adrenals/Urinary Tract: There is symmetric enhancement of the bilateral kidneys. Suspected mild atrophy of the left kidney in comparison to the right. No evidence of nephrolithiasis on this postcontrast examination. No discrete renal lesions. There is a very minimal amount of bilateral perinephric stranding, likely age and body habitus related. No evidence of urinary obstruction. Normal appearance of the bilateral adrenal glands. Normal appearance of the urinary bladder given degree of distention. Stomach/Bowel: Mild gaseous distension of predominantly the transverse colon, without evidence of enteric obstruction. Normal appearance of the  terminal ileum and the retrocecal appendix. No discrete areas of bowel wall thickening. No hiatal hernia. No pneumoperitoneum, pneumatosis or portal venous gas. Lymphatic: No bulky retroperitoneal, mesenteric, pelvic or inguinal lymphadenopathy. Reproductive: Redemonstrated dystrophic fibroid within the right-side of the uterus. No discrete adnexal  lesions. No free fluid the pelvic cul-de-sac. Other: Mild diffuse body wall anasarca, most conspicuous about the midline of the low back. Tiny mesenteric fat containing periumbilical hernia. Musculoskeletal: No acute or aggressive osseous abnormalities. Stigmata of dish within the lower thoracic spine. Mild-to-moderate degenerative change the bilateral hips with joint space loss, subchondral sclerosis and osteophytosis. IMPRESSION: VASCULAR 1. Moderate amount of atherosclerotic plaque within a normal caliber abdominal aorta, not resulting in a hemodynamically significant stenosis. 2. No evidence of acute or chronic mesenteric ischemia. 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 Both the SMA and celiac arteries are widely patent without a hemodynamically significant stenosis. 3. Noncalcified atherosclerotic plaque results in short segment severe (greater 70%) luminal narrowing of the left renal artery with associated mild asymmetric renal atrophy but without associated delayed renal enhancement. 4. Atherosclerotic plaque involving the proximal aspects of the bilateral superficial femoral arteries results in approximately 50% luminal narrowing bilaterally. Correlation for symptoms of PAD is advised. NON-VASCULAR 1. No acute findings within the abdomen or pelvis. Specifically, no evidence of enteric or urinary obstruction. 2. No definitive evidence of recurrent acute or chronic pancreatitis. Further evaluation with acquisition of amylase and lipase levels could be performed as indicated. 3. Advanced hepatic  steatosis.  Correlation with LFTs is advised. Electronically Signed   By: Sandi Mariscal M.D.   On: 07/13/2021 16:03    EKG: Independently reviewed.  Sinus tachycardia rate 115, QTc 429.  No significant ST or T wave changes compared to prior.  Assessment/Plan Principal Problem:   Intractable vomiting Active Problems:   Chronic [name] with cirrhosis (HCC)   Alcoholic cirrhosis of liver without ascites (HCC)   Essential hypertension   Weakness   Malaise   Intractable vomiting, generalized weakness- down trending weights from 169 lbs- 10/22 2 months ago to 140 pounds today.  Lipase normal 25.  CT abdomen and pelvis without acute abnormality.  Albumin 0.6 otherwise liver enzymes unremarkable. -Bowel rest with clear liquid diet -Evaluated by GI in the ED, etiology unclear, supportive care for now, may need to consider endoscopic evaluation when she stabilizes from a mentation standpoint.  SIRS- Mild leukocytosis 12.4.  T-max 99.2.  Tachycardic-heart rate 104-126.  Barely meeting SIRS criteria.  Patient appears very dehydrated.  UA not suggestive of UTI.  Denies respiratory symptoms.  Abdominal CT unremarkable for acute abnormality. - total 1 l bolus, continue D5 N/s 100cc/hr x 15hrs -Check procalcitonin -Obtain blood cultures - Check lactic acid  Encephalopathy,  Likely metabolic.  Lethargy.  Exact etiology unclear.  Patient appears very dehydrated.  No focus of infection identified at this time.  Ammonia less than 10.  Blood alcohol unremarkable.  Sluggish speech/response to questions, unable to walk.  MRI brain- No Acute brain findings.  Shows atrophy and chronic small vessel ischemic changes to pons and cerebral hemispheric white matter. -Hydrate for now -B12, folate - D5 N/s 100cc/hr  -Will start thiamine supplementation following GI recommendations -Hold Neurontin and Flexeril -May need PT evaluation -Check TSH, folate, H08  Alcoholic liver cirrhosis, chronic [name] with  cirrhosis-CT shows mild diffuse body wall anasarca was conspicuous about the midline of the low back.  Ammonia less than 10.  Hypertension-stable. -Resume hydralazine, labetalol,  DVT prophylaxis:  Lovenox Code Status: Full code Family Communication: Sister Lilly at bedside, patients god-daughter also present. Disposition Plan:  ~ 2 days, pending improvement in mental status, and GI evaluation if EGD is indicated or  needed. Consults called: None Admission status: Obs, tele   Bethena Roys MD [facility]  07/13/2021, 9:56 PM

## 2021-07-13 NOTE — Plan of Care (Signed)

## 2021-07-13 NOTE — Consult Note (Addendum)
Referring Provider: Dr. Eulis Foster [facility]) Primary Care Physician:  Sandi Mariscal, MD Primary Gastroenterologist:  Dr. Gala Romney   Date of Admission: 07/13/21 Date of Consultation: 07/13/21  Reason for Consultation:  N/V , abdominal pain   HPI:  Colleen Lee is a 65 y.o. 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 She also has history notable for pyloric stenosis s/p dilation in 2019.   I called and spoke to [name], patient's spouse. He states he believes patient has been vomiting at home. Not eating/drinking. Believes present for about a week. Not drinking any alcohol. Spouse believes she may have had a stroke. Spouse is a limited historian. Unable to obtain any significant information.   I called to speak to sister, Colleen Lee, but I was unable to reach her. Patient is unable to give me any significant history  today. She is drowsy and difficult to stay awake. Follows simple commands. Believes the year is 2004. Denies drinking any alcohol to me. She does state she has been vomiting but unsure how long.   Remainder of information gathered from the chart. Appears patient presented to the ED with ongoing vomiting for the past week and generalized weakness. Lipase normal today. Leukocytosis with WBC count 12.4. Hgb near baseline at 11.3. No overt GI bleeding noted in record. CTA completed today with widely patent celiac, SMA. IMA is occluded at its origin with collateral supply.    Last EGD November 2021: Normal-appearing esophagus, no varices, portal hypertensive gastropathy, no specimens collected.  Plans for repeat EGD in 2 years   Last colonoscopy  June 2019, 6 mm cecal inflammatory polyp removed.  Surveillance in 5 years.  Past Medical History:  Diagnosis Date   Asthma    Chronic abdominal pain    Chronic back pain    Chronic constipation    Cirrhosis (HCC)    Metavir score F4 on elastography 1096   Cyclical vomiting    Fibroids    Fibromyalgia    GERD (gastroesophageal reflux disease)    H. pylori infection 2014   treated with pylera, had to be treated again as it was not eradicated. Urea breath test then negative after subsequent treatment.    [name]    HCV RNA positive 09/2012   Hypertension    Marijuana use    Nausea and vomiting    chronic, recurrent   PONV (postoperative nausea and vomiting)    pt thinks maybe once she had N&V   Sciatica of left side     Past Surgical History:  Procedure Laterality Date   BALLOON DILATION  12/20/2017   Procedure: BALLOON DILATION;  Surgeon: Danie Binder, MD;  [facility];  Service: Endoscopy;;  pyloric dilation   BIOPSY  12/20/2017   Procedure: BIOPSY;  Surgeon: Danie Binder, MD;  [facility];  Service: Endoscopy;;  duodenal and gastric biopsy   COLONOSCOPY  01/18/2008   EAV:[id number] rectum.  Long redundant colon, a diminutive sigmoid polyp status post cold biopsy removed. Hyperplastic polyp. Repeat colonoscopy June 2014 due to family history of colon cancer   COLONOSCOPY WITH ESOPHAGOGASTRODUODENOSCOPY (EGD) N/A 11/02/2012   BJY:[id number] gastric mucosa of doubtful, +H.pylori. Incomplete colonoscopy due to  patient unable to tolerate exam, proximal colon seen. Patient refused ACBE.   COLONOSCOPY WITH PROPOFOL N/A 03/08/2013   Dr. Gala Romney: colonic polyp-removed as scribed above. Internal Hemorrhoids. Pathology did not reveal any colonic tissue, only mucus. SURVEILLANCE DUE Aug 2019   COLONOSCOPY WITH PROPOFOL N/A 01/19/2018   Dr. Gala Romney: 6 mm cecal inflammatoy polyp, otherwise normal., Surveilance in 5 year s   ESOPHAGEAL DILATION  12/20/2017   Procedure:  ESOPHAGEAL DILATION;  Surgeon: Danie Binder, MD;  [facility];  Service: Endoscopy;;   ESOPHAGOGASTRODUODENOSCOPY  01/18/2008   RMR: Normal esophagus, normal  stomach   ESOPHAGOGASTRODUODENOSCOPY (EGD) WITH PROPOFOL N/A 02/09/2016   Normal esophagus, small hiatal hernia, portal hypertensive gastropathy, normal second portion of duodenum. Due in July 2019   ESOPHAGOGASTRODUODENOSCOPY (EGD) WITH PROPOFOL N/A 12/20/2017   Dr. Oneida Alar: benign appearing esophageal stenosis s/p dilation, mild pyloric stenosis s/p biopsy and dilation, mild duodenitis.    ESOPHAGOGASTRODUODENOSCOPY (EGD) WITH PROPOFOL N/A 06/12/2020   Rourk: Normal esophagus, portal hypertensive gastropathy, no specimens collected.  Tentatively plan for repeat EGD in 2 years.   HYSTEROSCOPY  05/11/2016   Procedure: HYSTEROSCOPY;  Surgeon: Jonnie Kind, MD;  Location: [facility];  Service: Gynecology;;   KNEE SURGERY     left knee   MULTIPLE EXTRACTIONS WITH ALVEOLOPLASTY N/A 10/15/2013   Procedure: MULTIPLE EXTRACTION WITH ALVEOLOPLASTY;  Surgeon: Gae Bon, DDS;  Location: [place];  Service: Oral Surgery;  Laterality: N/A;   POLYPECTOMY N/A 03/08/2013   Procedure: POLYPECTOMY;  Surgeon: Daneil Dolin, MD;  Location: [facility];  Service: Endoscopy;  Laterality: N/A;   POLYPECTOMY N/A 05/11/2016   Procedure: REMOVAL OF ENDOMETRIAL POLYP;  Surgeon: Jonnie Kind, MD;  Location: [facility];  Service: Gynecology;  Laterality: N/A;   RESECTION DISTAL CLAVICAL Right 01/30/2019   Procedure: RESECTION DISTAL CLAVICAL;  Surgeon: Carole Civil, MD;  Location: [facility];  Service: Orthopedics;  Laterality: Right;   SHOULDER OPEN ROTATOR CUFF REPAIR Right 01/30/2019   Procedure: ROTATOR CUFF REPAIR SHOULDER OPEN;  Surgeon: Carole Civil, MD;  Location: [facility];  Service: Orthopedics;  Laterality: Right;  pt to arrive at 7:30 for PICC at 8:00   [place] Left 02/05/2015   Procedure: LEFT TOTAL KNEE ARTHROPLASTY;  Surgeon:  Carole Civil, MD;  Location: [facility];  Service: Orthopedics;  Laterality: Left;    Prior to Admission medications   Medication Sig Start Date End Date Taking? Authorizing Provider  albuterol (PROVENTIL HFA;VENTOLIN HFA) 108 (90 Base) MCG/ACT inhaler Inhale 2 puffs into the lungs every 6 (six) hours as needed for wheezing or shortness of breath.     [provider]  allopurinol (ZYLOPRIM) 300 MG tablet Take 300 mg by mouth daily. 04/15/21   [provider]  cyclobenzaprine (FLEXERIL) 5 MG tablet Take 5 mg by mouth 3 (three) times daily. 04/15/21   [provider]  folic acid (FOLVITE) 1 MG tablet Take 1 tablet (1 mg total) by mouth daily. 01/06/21   Barton Dubois, MD  gabapentin (NEURONTIN) 100 MG capsule Take 1 capsule (100 mg total) by mouth 3 (three) times daily. Patient taking differently: Take 100 mg by mouth daily. 12/22/18   Carole Civil, MD  hydrALAZINE (APRESOLINE) 50 MG tablet Take 1 tablet (50 mg total) by mouth every 8 (eight) hours. 01/30/18   Barton Dubois, MD  HYDROcodone-acetaminophen (NORCO/VICODIN) 5-325 MG tablet Take 1 tablet by mouth every 6 (six) hours as needed. 06/08/21   Sabra Heck,  Aaron Edelman, MD  hydrOXYzine (ATARAX/VISTARIL) 10 MG tablet Take 10 mg by mouth daily as needed. 11/30/17   [provider]  labetalol (NORMODYNE) 100 MG tablet Take 2 tablets (200 mg total) by mouth 2 (two) times daily. Patient taking differently: Take 100 mg by mouth in the morning. 01/30/18   Barton Dubois, MD  lactulose (CHRONULAC) 10 GM/15ML solution Take 30 mLs (20 g total) by mouth 2 (two) times daily as needed for mild constipation or moderate constipation. Patient not taking: Reported on 07/09/2021 05/29/21   Daleen Bo, MD  megestrol (MEGACE) 40 MG tablet Take 40 mg by mouth daily. 04/02/21   [provider]  mupirocin ointment (BACTROBAN) 2 % Apply topically 2 (two) times daily. 06/18/21   [provider]  naloxone [facility]) nasal spray  4 mg/0.1 mL See admin instructions. ADMINISTER A SINGLE SPRAYEOF NARCAN INTO ONECNOSTRIL. REPEAT AFTER 3SMINUTES IN OTHER NOSTRIL IF NO RESPONSE. 10/03/20   [provider]  ondansetron (ZOFRAN ODT) 4 MG disintegrating tablet 4mg  ODT q4 hours prn nausea/vomit 05/11/21   Milton Ferguson, MD  ondansetron (ZOFRAN) 4 MG tablet Take 4 mg by mouth. Patient not taking: Reported on 07/09/2021 03/10/21   [provider]  pantoprazole (PROTONIX) 40 MG tablet Take 1 tablet (40 mg total) by mouth 2 (two) times daily. 06/30/21 06/30/22  Rourk, Cristopher Estimable, MD  Plecanatide (TRULANCE) 3 MG TABS Take 3 mg by mouth daily. Patient not taking: Reported on 07/09/2021 06/30/21 06/30/22  Daneil Dolin, MD  polyethylene glycol (GOLYTELY) 236 g solution Drink 8 ounces ( 1cup) of solution every hour until you have a bowel movement. Patient not taking: Reported on 06/30/2021 05/31/21   Evalee Jefferson, PA-C  polyethylene glycol (MIRALAX) 17 g packet Take 17 g by mouth daily as needed for mild constipation. Patient not taking: Reported on 07/09/2021 01/05/21   Barton Dubois, MD  potassium chloride (MICRO-K) 10 MEQ CR capsule Take 20 mEq by mouth daily. Patient not taking: Reported on 07/09/2021 05/30/21   [provider]  prednisoLONE acetate (PRED FORTE) 1 % ophthalmic suspension Place 1 drop into both eyes 2 (two) times daily. 03/10/21   [provider]  sucralfate (CARAFATE) 1 g tablet Take 1 tablet (1 g total) by mouth 4 (four) times daily -  with meals and at bedtime for 7 days. 07/09/21 07/16/21  Wynona Dove A, DO  thiamine 100 MG tablet Take 1 tablet (100 mg total) by mouth daily. 01/06/21   Barton Dubois, MD    No current facility-administered medications for this encounter.   Current Outpatient Medications  Medication Sig Dispense Refill   albuterol (PROVENTIL HFA;VENTOLIN HFA) 108 (90 Base) MCG/ACT inhaler Inhale 2 puffs into the lungs every 6 (six) hours as needed for wheezing or shortness of  breath.      allopurinol (ZYLOPRIM) 300 MG tablet Take 300 mg by mouth daily.     cyclobenzaprine (FLEXERIL) 5 MG tablet Take 5 mg by mouth 3 (three) times daily.     folic acid (FOLVITE) 1 MG tablet Take 1 tablet (1 mg total) by mouth daily. 30 tablet 1   gabapentin (NEURONTIN) 100 MG capsule Take 1 capsule (100 mg total) by mouth 3 (three) times daily. (Patient taking differently: Take 100 mg by mouth daily.) 90 capsule 2   hydrALAZINE (APRESOLINE) 50 MG tablet Take 1 tablet (50 mg total) by mouth every 8 (eight) hours. 90 tablet 1   HYDROcodone-acetaminophen (NORCO/VICODIN) 5-325 MG tablet Take 1 tablet by mouth every  6 (six) hours as needed. 8 tablet 0   hydrOXYzine (ATARAX/VISTARIL) 10 MG tablet Take 10 mg by mouth daily as needed.     labetalol (NORMODYNE) 100 MG tablet Take 2 tablets (200 mg total) by mouth 2 (two) times daily. (Patient taking differently: Take 100 mg by mouth in the morning.) 120 tablet 1   lactulose (CHRONULAC) 10 GM/15ML solution Take 30 mLs (20 g total) by mouth 2 (two) times daily as needed for mild constipation or moderate constipation. (Patient not taking: Reported on 07/09/2021) 473 mL 0   megestrol (MEGACE) 40 MG tablet Take 40 mg by mouth daily.     mupirocin ointment (BACTROBAN) 2 % Apply topically 2 (two) times daily.     naloxone (NARCAN) nasal spray 4 mg/0.1 mL See admin instructions. ADMINISTER A SINGLE SPRAYEOF NARCAN INTO ONECNOSTRIL. REPEAT AFTER 3SMINUTES IN OTHER NOSTRIL IF NO RESPONSE.     ondansetron (ZOFRAN ODT) 4 MG disintegrating tablet 4mg  ODT q4 hours prn nausea/vomit 12 tablet 0   ondansetron (ZOFRAN) 4 MG tablet Take 4 mg by mouth. (Patient not taking: Reported on 07/09/2021)     pantoprazole (PROTONIX) 40 MG tablet Take 1 tablet (40 mg total) by mouth 2 (two) times daily. 60 tablet 11   Plecanatide (TRULANCE) 3 MG TABS Take 3 mg by mouth daily. (Patient not taking: Reported on 07/09/2021) 30 tablet 11   polyethylene glycol (GOLYTELY) 236 g solution  Drink 8 ounces ( 1cup) of solution every hour until you have a bowel movement. (Patient not taking: Reported on 06/30/2021) 4000 mL 0   polyethylene glycol (MIRALAX) 17 g packet Take 17 g by mouth daily as needed for mild constipation. (Patient not taking: Reported on 07/09/2021) 28 each 2   potassium chloride (MICRO-K) 10 MEQ CR capsule Take 20 mEq by mouth daily. (Patient not taking: Reported on 07/09/2021)     prednisoLONE acetate (PRED FORTE) 1 % ophthalmic suspension Place 1 drop into both eyes 2 (two) times daily.     sucralfate (CARAFATE) 1 g tablet Take 1 tablet (1 g total) by mouth 4 (four) times daily -  with meals and at bedtime for 7 days. 28 tablet 0   thiamine 100 MG tablet Take 1 tablet (100 mg total) by mouth daily. 30 tablet 1    Allergies as of 07/13/2021 - Review Complete 07/13/2021  Allergen Reaction Noted   Penicillins Rash     Family History  Problem Relation Age of Onset   Colon cancer Brother 80           Multiple myeloma Brother    Liver cancer Sister    Prostate cancer Brother    Pancreatic cancer Brother    Cancer Mother        breast   Asthma Mother     Social History   Socioeconomic History   Marital status: Married    Spouse name: Not on file   Number of children: 0   Years of education: Not on file   Highest education level: Not on file  Occupational History   Occupation: umemployed   Tobacco Use   Smoking status: Every Day    Packs/day: 1.00    Years: 40.00    Pack years: 40.00    Types: Cigarettes   Smokeless tobacco: Never   Tobacco comments:    Smokes one pack of cigarettes every 1.5 days  Vaping Use   Vaping Use: Never used  Substance and Sexual Activity   Alcohol use: Not Currently  Comment: 1 bottle of dinner wine daily; 06/30/21 stopped drinking   Drug use: Not Currently    Types: Marijuana    Comment: history of marijuana in past- almost 2 years ago   Sexual activity: Never    Birth control/protection: None  Other Topics  Concern   Not on file  Social History Narrative   Not on file   Social Determinants of Health   Financial Resource Strain: Not on file  Food Insecurity: Not on file  Transportation Needs: Not on file  Physical Activity: Not on file  Stress: Not on file  Social Connections: Not on file  Intimate Partner Violence: Not on file    Review of Systems: Unable to obtain due to cognitive status   Physical Exam: Vital signs in last 24 hours: Temp:  [97.4 F (36.3 C)] 97.4 F (36.3 C) (12/12 1049) Pulse Rate:  [108-126] 114 (12/12 1530) Resp:  [11-18] 15 (12/12 1530) BP: (118-151)/(84-104) 118/84 (12/12 1530) SpO2:  [97 %-100 %] 98 % (12/12 1530) Weight:  [63.5 kg] 63.5 kg (12/12 1049)   General:   somnolent, awakens to verbal stimuli. Knows her name but unknown year. Difficult to engage in conversation.  Head:  Normocephalic and atraumatic. Eyes:  Sclera clear, no icterus.   Conjunctiva pink. Lungs:  Clear throughout to auscultation.    Heart:  S1 S2 present Abdomen:  +BS, distended but soft, mild TTP upper abdomen Rectal:  Deferred until time of colonoscopy.   Msk:  Symmetrical without gross deformities. Normal posture. Pulses:  Normal pulses noted. Extremities:  Without  edema. Neurologic:  somnolent.  Psych:  flat affect  Intake/Output from previous day: No intake/output data recorded. Intake/Output this shift: Total I/O In: 500 [IV Piggyback:500] Out: -   Lab Results: Recent Labs    07/13/21 1130  WBC 12.4*  HGB 11.3*  HCT 34.4*  PLT 297   BMET Recent Labs    07/13/21 1130  NA 133*  K 4.0  CL 105  CO2 18*  GLUCOSE 81  BUN 14  CREATININE 1.00  CALCIUM 11.0*     Studies/Results: CT Angio Abd/Pel W and/or Wo Contrast  Result Date: 07/13/2021 CLINICAL DATA:  Nausea, weakness and weight loss for the past week. Concern for acute mesenteric ischemia. EXAM: CTA ABDOMEN AND PELVIS WITHOUT AND WITH CONTRAST TECHNIQUE: Multidetector CT imaging of the  abdomen and pelvis was performed using the standard protocol during bolus administration of intravenous contrast. Multiplanar reconstructed images and MIPs were obtained and reviewed to evaluate the vascular anatomy. CONTRAST:  79mL OMNIPAQUE IOHEXOL 300 MG/ML  SOLN COMPARISON:  CT abdomen pelvis-07/09/2021; 05/31/2021; 05/11/2021 FINDINGS: VASCULAR Aorta: There is a moderate amount predominantly calcified atherosclerotic plaque within normal caliber abdominal aorta, not resulting in a hemodynamically significant stenosis. No abdominal aortic dissection or periaortic stranding. Celiac: Widely patent without a hemodynamically significant narrowing. Conventional branching pattern. SMA: Widely patent without hemodynamically significant narrowing. Conventional branching pattern. The distal tributaries the SMA appear widely patent without discrete lumen filling defect to suggest distal embolism. Renals: Solitary bilaterally; there is a large amount of eccentric noncalcified atherosclerotic plaque involving the origin of the left renal artery resulting in short segment severe (greater 75%) luminal narrowing (axial image 74, series 4) and while this finding may be associated with slight asymmetric left-sided renal atrophy, there is no definitive delayed renal enhancement. The right renal artery is widely patent without hemodynamically significant narrowing. No vessel irregularity to suggest FMD. IMA: Occluded at its origin with early reconstitution  via collateral supply from the SMA. Inflow: There is a minimal amount of eccentric calcified and noncalcified atherosclerotic plaque involving the bilateral common iliac arteries, not resulting in a hemodynamically significant stenosis. The bilateral internal iliac arteries are mildly disease though patent and of normal caliber. The bilateral external iliac arteries are of normal caliber and widely patent without hemodynamically significant narrowing. Proximal Outflow: There is  a minimal amount of eccentric mixed calcified and noncalcified atherosclerotic plaque involving the bilateral common femoral arteries, not resulting in a hemodynamically significant stenosis. Eccentric calcified and noncalcified atherosclerotic plaque approaches 50% luminal narrowing involving the proximal aspects of the bilateral superficial femoral arteries (left image 204, series 4; right image 207, series 4). Veins: The IVC and pelvic venous systems appear patent. Review of the MIP images confirms the above findings. _________________________________________________________ NON-VASCULAR Lower chest: Limited visualization of the lower thorax demonstrates minimal subpleural ground-glass atelectasis. No discrete focal airspace opacities. No pleural effusion. Normal heart size.  No pericardial effusion. Hepatobiliary: There is diffuse decreased attenuation of the hepatic parenchyma compatible with hepatic steatosis. No discrete hyperenhancing hepatic lesions. Normal early arterial phase appearance of the gallbladder given degree distention. No radiopaque gallstones. No intra or extrahepatic biliary duct dilatation. No ascites. Pancreas: The pancreas appears somewhat atretic but without definitive evidence of peripancreatic stranding on this early arterial phase examination. Spleen: Normal appearance of the spleen. No evidence of splenomegaly. Adrenals/Urinary Tract: There is symmetric enhancement of the bilateral kidneys. Suspected mild atrophy of the left kidney in comparison to the right. No evidence of nephrolithiasis on this postcontrast examination. No discrete renal lesions. There is a very minimal amount of bilateral perinephric stranding, likely age and body habitus related. No evidence of urinary obstruction. Normal appearance of the bilateral adrenal glands. Normal appearance of the urinary bladder given degree of distention. Stomach/Bowel: Mild gaseous distension of predominantly the transverse colon,  without evidence of enteric obstruction. Normal appearance of the terminal ileum and the retrocecal appendix. No discrete areas of bowel wall thickening. No hiatal hernia. No pneumoperitoneum, pneumatosis or portal venous gas. Lymphatic: No bulky retroperitoneal, mesenteric, pelvic or inguinal lymphadenopathy. Reproductive: Redemonstrated dystrophic fibroid within the right-side of the uterus. No discrete adnexal lesions. No free fluid the pelvic cul-de-sac. Other: Mild diffuse body wall anasarca, most conspicuous about the midline of the low back. Tiny mesenteric fat containing periumbilical hernia. Musculoskeletal: No acute or aggressive osseous abnormalities. Stigmata of dish within the lower thoracic spine. Mild-to-moderate degenerative change the bilateral hips with joint space loss, subchondral sclerosis and osteophytosis. IMPRESSION: VASCULAR 1. Moderate amount of atherosclerotic plaque within a normal caliber abdominal aorta, not resulting in a hemodynamically significant stenosis. 2. No evidence of acute or chronic mesenteric ischemia. 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 Both the SMA and celiac arteries are widely patent without a hemodynamically significant stenosis. 3. Noncalcified atherosclerotic plaque results in short segment severe (greater 70%) luminal narrowing of the left renal artery with associated mild asymmetric renal atrophy but without associated delayed renal enhancement. 4. Atherosclerotic plaque involving the proximal aspects of the bilateral superficial femoral arteries results in approximately 50% luminal narrowing bilaterally. Correlation for symptoms of PAD is advised. NON-VASCULAR 1. No acute findings within the abdomen or pelvis. Specifically, no evidence of enteric or urinary obstruction. 2. No definitive evidence of recurrent acute or chronic pancreatitis. Further evaluation with acquisition of amylase and  lipase levels could be performed as indicated. 3. Advanced hepatic steatosis.  Correlation with LFTs is advised. Electronically Signed   By: Sandi Mariscal M.D.   On: 07/13/2021 16:03    Impression:  65 y.o. 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 She also has history notable for pyloric stenosis s/p dilation in 2019. Now presenting with N/V for one week, overall failure to thrive, weakness, and mental status changes.  I know patient very well from our office, and she has had marked decline and changes in mental status of unclear etiology. Discussed concern with ED physician, who will be pursuing CT head. Family has also stated concern regarding stroke as outpatient, but her husband is not able to give me any additional history regarding this. No formal evaluation that I'm aware.   N/V etiology unclear: she does have a history of pyloric stenosis, celiac artery compression syndrome, history of ETOH use. Denies any ETOH use to me, and husband denies this as well. Unable to obtain significant history from patient or family today. CTA completed today without evidence for acute or chronic mesenteric ischemia. She would benefit from seeing Vascular again as outpatient.   History of advanced fibrosis: with history of HCV s/p eradication, ETOH use, fatty liver. Platelets normal. Portal gastropath on EGD in 2021, but it is felt dealing more with advanced fibrosis instead of cirrhosis.   CT head to be completed. We are ordering additional labs including ethanol level, ammonia, and LFTs. May need to consider thiamine replacement in case concern for this as a reason  for encephalopathy. Awaiting labs and CT.   Ultimately, may need EGD once she is stabilized. Will continue to follow as database unfolds.   Plan: LFTs, ETOH level, ammonia CT head recommended May need EGD once stabilized Consider thiamine replacement Will continue to follow with you   Annitta Needs, PhD, ANP-BC [facility]     LOS: 0 days    07/13/2021, 4:10 PM

## 2021-07-13 NOTE — ED Triage Notes (Signed)
Sister reports patient is unable to tolerate PO.  Reports every time she attempts meds patient vomits a few minutes later.  Reports she has lost weight and has become very weak.

## 2021-07-14 ENCOUNTER — Encounter (HOSPITAL_COMMUNITY): Payer: Self-pay | Admitting: Internal Medicine

## 2021-07-14 ENCOUNTER — Encounter (HOSPITAL_COMMUNITY): Payer: Self-pay | Admitting: Radiology

## 2021-07-14 DIAGNOSIS — K7460 Unspecified cirrhosis of liver: Secondary | ICD-10-CM

## 2021-07-14 DIAGNOSIS — B182 Chronic viral hepatitis C: Secondary | ICD-10-CM

## 2021-07-14 DIAGNOSIS — D539 Nutritional anemia, unspecified: Secondary | ICD-10-CM

## 2021-07-14 DIAGNOSIS — D649 Anemia, unspecified: Secondary | ICD-10-CM

## 2021-07-14 DIAGNOSIS — R6510 Systemic inflammatory response syndrome (SIRS) of non-infectious origin without acute organ dysfunction: Secondary | ICD-10-CM

## 2021-07-14 DIAGNOSIS — I10 Essential (primary) hypertension: Secondary | ICD-10-CM

## 2021-07-14 DIAGNOSIS — R1011 Right upper quadrant pain: Secondary | ICD-10-CM | POA: Diagnosis not present

## 2021-07-14 LAB — CBC
HCT: 25 % — ABNORMAL LOW (ref 36.0–46.0)
Hemoglobin: 8.6 g/dL — ABNORMAL LOW (ref 12.0–15.0)
MCH: 34.5 pg — ABNORMAL HIGH (ref 26.0–34.0)
MCHC: 34.4 g/dL (ref 30.0–36.0)
MCV: 100.4 fL — ABNORMAL HIGH (ref 80.0–100.0)
Platelets: 200 10*3/uL (ref 150–400)
RBC: 2.49 MIL/uL — ABNORMAL LOW (ref 3.87–5.11)
RDW: 18.8 % — ABNORMAL HIGH (ref 11.5–15.5)
WBC: 10 10*3/uL (ref 4.0–10.5)
nRBC: 0 % (ref 0.0–0.2)

## 2021-07-14 LAB — GLUCOSE, CAPILLARY
Glucose-Capillary: 191 mg/dL — ABNORMAL HIGH (ref 70–99)
Glucose-Capillary: 191 mg/dL — ABNORMAL HIGH (ref 70–99)
Glucose-Capillary: 138 mg/dL — ABNORMAL HIGH (ref 70–99)

## 2021-07-14 LAB — PROCALCITONIN: Procalcitonin: 19.68 ng/mL

## 2021-07-14 LAB — BASIC METABOLIC PANEL
Anion gap: 8 (ref 5–15)
BUN: 10 mg/dL (ref 8–23)
CO2: 16 mmol/L — ABNORMAL LOW (ref 22–32)
Calcium: 9.9 mg/dL (ref 8.9–10.3)
Chloride: 108 mmol/L (ref 98–111)
Creatinine, Ser: 0.77 mg/dL (ref 0.44–1.00)
GFR, Estimated: >60 mL/min (ref >60)
Glucose, Bld: 126 mg/dL — ABNORMAL HIGH (ref 70–99)
Potassium: 3.3 mmol/L — ABNORMAL LOW (ref 3.5–5.1)
Sodium: 132 mmol/L — ABNORMAL LOW (ref 135–145)

## 2021-07-14 LAB — PROTIME-INR
INR: 1.2 (ref 0.8–1.2)
Prothrombin Time: 15.4 seconds — ABNORMAL HIGH (ref 11.4–15.2)

## 2021-07-14 LAB — VITAMIN B12: Vitamin B-12: >1550 pg/mL — ABNORMAL HIGH (ref 180–914)

## 2021-07-14 MED ORDER — LABETALOL HCL 200 MG PO TABS
100.0000 mg | ORAL_TABLET | Freq: Two times a day (BID) | ORAL | Status: DC
  Administered 2021-07-14 – 2021-07-16 (×4): 100 mg via ORAL
  Filled 2021-07-14 (×4): qty 1

## 2021-07-14 MED ORDER — HYDROCODONE-ACETAMINOPHEN 5-325 MG PO TABS
1.0000 | ORAL_TABLET | Freq: Four times a day (QID) | ORAL | Status: DC | PRN
  Administered 2021-07-14 – 2021-07-16 (×8): 1 via ORAL
  Filled 2021-07-14 (×8): qty 1

## 2021-07-14 MED ORDER — METRONIDAZOLE 500 MG/100ML IV SOLN
500.0000 mg | Freq: Two times a day (BID) | INTRAVENOUS | Status: DC
  Administered 2021-07-14 – 2021-07-16 (×4): 500 mg via INTRAVENOUS
  Filled 2021-07-14 (×5): qty 100

## 2021-07-14 MED ORDER — SODIUM CHLORIDE 0.9 % IV SOLN
2.0000 g | INTRAVENOUS | Status: DC
  Administered 2021-07-14 – 2021-07-15 (×2): 2 g via INTRAVENOUS
  Filled 2021-07-14 (×3): qty 20

## 2021-07-14 NOTE — Care Management Obs Status (Signed)
[place] NOTIFICATION   Patient Details  Name: Colleen Lee MRN: [id number] Date of Birth: 1956-01-06   Medicare Observation Status Notification Given:  Yes    Tommy Medal 07/14/2021, 3:20 PM

## 2021-07-14 NOTE — Assessment & Plan Note (Signed)
Unknown etiology. Improved today. GI consulted and recommendations are pending today. Recommendations yesterday for consideration of endoscopic evaluation.

## 2021-07-14 NOTE — Plan of Care (Signed)
  Problem: Education: Goal: Knowledge of General Education information will improve Description: Including pain rating scale, medication(s)/side effects and non-pharmacologic comfort measures 07/14/2021 0244 by Caroleen Hamman, RN Outcome: Progressing 07/13/2021 2006 by Caroleen Hamman, RN Outcome: Progressing   Problem: Health Behavior/Discharge Planning: Goal: Ability to manage health-related needs will improve 07/14/2021 0244 by Caroleen Hamman, RN Outcome: Progressing 07/13/2021 2006 by Caroleen Hamman, RN Outcome: Progressing   Problem: Clinical Measurements: Goal: Ability to maintain clinical measurements within normal limits will improve 07/14/2021 0244 by Caroleen Hamman, RN Outcome: Progressing 07/13/2021 2006 by Caroleen Hamman, RN Outcome: Progressing Goal: Will remain free from infection 07/14/2021 0244 by Caroleen Hamman, RN Outcome: Progressing 07/13/2021 2006 by Caroleen Hamman, RN Outcome: Progressing Goal: Diagnostic test results will improve 07/14/2021 0244 by Caroleen Hamman, RN Outcome: Progressing 07/13/2021 2006 by Caroleen Hamman, RN Outcome: Progressing Goal: Respiratory complications will improve 07/14/2021 0244 by Caroleen Hamman, RN Outcome: Progressing 07/13/2021 2006 by Caroleen Hamman, RN Outcome: Progressing Goal: Cardiovascular complication will be avoided 07/14/2021 0244 by Caroleen Hamman, RN Outcome: Progressing 07/13/2021 2006 by Caroleen Hamman, RN Outcome: Progressing   Problem: Activity: Goal: Risk for activity intolerance will decrease 07/14/2021 0244 by Caroleen Hamman, RN Outcome: Progressing 07/13/2021 2006 by Caroleen Hamman, RN Outcome: Progressing   Problem: Nutrition: Goal: Adequate nutrition will be maintained 07/14/2021 0244 by Caroleen Hamman, RN Outcome: Progressing 07/13/2021 2006 by Caroleen Hamman, RN Outcome: Progressing   Problem: Coping: Goal: Level of anxiety will  decrease 07/14/2021 0244 by Caroleen Hamman, RN Outcome: Progressing 07/13/2021 2006 by Caroleen Hamman, RN Outcome: Progressing   Problem: Elimination: Goal: Will not experience complications related to bowel motility 07/14/2021 0244 by Caroleen Hamman, RN Outcome: Progressing 07/13/2021 2006 by Caroleen Hamman, RN Outcome: Progressing Goal: Will not experience complications related to urinary retention 07/14/2021 0244 by Caroleen Hamman, RN Outcome: Progressing 07/13/2021 2006 by Caroleen Hamman, RN Outcome: Progressing   Problem: Pain Managment: Goal: General experience of comfort will improve 07/14/2021 0244 by Caroleen Hamman, RN Outcome: Progressing 07/13/2021 2006 by Caroleen Hamman, RN Outcome: Progressing   Problem: Safety: Goal: Ability to remain free from injury will improve 07/14/2021 0244 by Caroleen Hamman, RN Outcome: Progressing 07/13/2021 2006 by Caroleen Hamman, RN Outcome: Progressing   Problem: Skin Integrity: Goal: Risk for impaired skin integrity will decrease 07/14/2021 0244 by Caroleen Hamman, RN Outcome: Progressing 07/13/2021 2006 by Caroleen Hamman, RN Outcome: Progressing

## 2021-07-14 NOTE — TOC Progression Note (Signed)
Transition of Care [facility]) - Progression Note    Patient Details  Name: Colleen Lee MRN: [id number] Date of Birth: 1956-05-28  Transition of Care [facility]) CM/SW Contact  Salome Arnt, [place] Phone Number: 07/14/2021, 10:45 AM  Clinical Narrative:   Transition of Care [facility]) Screening Note   Patient Details  Name: Colleen Lee Date of Birth: Aug 31, 1955   Transition of Care [facility]) CM/SW Contact:    Salome Arnt, [place] Phone Number: 07/14/2021, 10:45 AM    Transition of Care Department [facility]) has reviewed patient and no TOC needs have been identified at this time. We will continue to monitor patient advancement through interdisciplinary progression rounds. If new patient transition needs arise, please place a TOC consult.             Expected Discharge Plan and Services                                                 Social Determinants of Health (SDOH) Interventions    Readmission Risk Interventions Readmission Risk Prevention Plan 05/19/2021  Transportation Screening Complete  HRI or Home Care Consult Complete  Social Work Consult for [place] Planning/Counseling Complete  Palliative Care Screening Not Applicable  Medication Review [employer]) Complete  Some recent data might be hidden

## 2021-07-14 NOTE — Assessment & Plan Note (Signed)
PT/OT eval ?

## 2021-07-14 NOTE — Assessment & Plan Note (Addendum)
Unknown etiology. Normal ammonia. Possibly related to initial dehydration as she has significantly improved and appears to be at baseline. Will hold high dose thiamine for now since mental status has improved. No ascites, but did have abdominal pain. -Check vitamin B1

## 2021-07-14 NOTE — Assessment & Plan Note (Addendum)
No source identified. Imaging with no specific findings. Patient symptoms non-specific. Mildly elevated WBC with significantly elevated procalcitonin (31.42). One set of blood cultures obtained last night. No urine culture obtained secondary to unremarkable urinalysis. -Repeat Blood culture sets -Start empiric Ceftriaxone/Flagyl for possible intra-abdominal source -Trend procalcitonin

## 2021-07-14 NOTE — Assessment & Plan Note (Signed)
-  Continue Labetalol 100 mg BD

## 2021-07-14 NOTE — Assessment & Plan Note (Addendum)
Normal LFTs today. Bilirubin of 1. INR not obtained. Patient does have pain mostly in RUQ, but labs not consistent with acute hepatitis. CT with evidence of anasarca -Follow-up INR -Strict in/out and daily weights

## 2021-07-14 NOTE — Progress Notes (Addendum)
Subjective: Chronic intermittent RUQ pain.  When pain comes on, it is constant for several days.  She has been having constant right upper quadrant pain since November.  Often worsens after meals. Chronic intermittent nausea/vomiting. Worsening since November, occurring daily. Couldn't keep anything down including her medications over the last week. Frequent reflux symptoms. Reports she was taking Protonix BID. No diarrhea. Bowels have been moving well at home. Some looser stools today. No watery stools. No brbpr or melena.   No nausea or vomiting since presenting to the ED. Has been tolerating clear liquid well today.  Sister at bedside who helps provide history. Mental status is improved, but not quite back to baseline.   Objective: Vital signs in last 24 hours: Temp:  [97.8 F (36.6 C)-99.7 F (37.6 C)] 97.8 F (36.6 C) (12/13 1350) Pulse Rate:  [78-114] 78 (12/13 1350) Resp:  [12-20] 18 (12/13 1350) BP: (95-159)/(63-110) 95/63 (12/13 1350) SpO2:  [98 %-100 %] 100 % (12/13 1350) Weight:  [63.3 kg] 63.3 kg (12/12 1800) Last BM Date: 07/13/21 General:   Alert and oriented, pleasant, no acute distress. Head:  Normocephalic and atraumatic. Abdomen:  Bowel sounds present, soft, non-distended.  Moderate TTP in RUQ region.  No HSM or hernias noted. No rebound or guarding. No masses appreciated  Msk:  Symmetrical without gross deformities. Normal posture. Extremities:  Without edema. Neurologic:  Alert and  oriented x4;  grossly normal neurologically. Psych: Normal mood and affect.  Intake/Output from previous day: 12/12 0701 - 12/13 0700 In: 700 [P.O.:200; IV Piggyback:500] Out: -  Intake/Output this shift: Total I/O In: 440 [P.O.:440] Out: -   Lab Results: Recent Labs    07/13/21 1130 07/14/21 1237  WBC 12.4* 10.0  HGB 11.3* 8.6*  HCT 34.4* 25.0*  PLT 297 200   BMET Recent Labs    07/13/21 1130  NA 133*  K 4.0  CL 105  CO2 18*  GLUCOSE 81  BUN 14  CREATININE  1.00  CALCIUM 11.0*   LFT Recent Labs    07/13/21 1717  PROT 6.9  ALBUMIN 2.6*  AST 40  ALT 21  ALKPHOS 103  BILITOT 1.0  BILIDIR 0.3*  IBILI 0.7    Studies/Results: MR BRAIN WO CONTRAST  Result Date: 07/13/2021 CLINICAL DATA:  Mental status changes. Lethargy. Speech disturbance. Lower extremity weakness. EXAM: MRI HEAD WITHOUT CONTRAST TECHNIQUE: Multiplanar, multiecho pulse sequences of the brain and surrounding structures were obtained without intravenous contrast. COMPARISON:  Head CT 05/12/2014 FINDINGS: Brain: Diffusion imaging does not show any acute or subacute infarction. Chronic small-vessel ischemic changes are seen throughout the pons. No focal cerebellar finding. Cerebral hemispheres show atrophy with mild to moderate chronic small-vessel ischemic changes of the white matter. No cortical or large vessel territory infarction. No mass lesion, hemorrhage, hydrocephalus or extra-axial collection. Vascular: Flow is present in both internal carotid arteries. Dominant left vertebral artery shows flow. There may not be flow in the small right vertebral artery. Skull and upper cervical spine: Normal Sinuses/Orbits: Clear/normal Other: None IMPRESSION: No acute brain finding. Atrophy. Chronic small-vessel ischemic changes of the pons and cerebral hemispheric white matter. Question abnormal flow in the non dominant right vertebral artery. Dominant left vertebral artery appears widely patent to the basilar. Electronically Signed   By: Nelson Chimes M.D.   On: 07/13/2021 18:28   CT Angio Abd/Pel W and/or Wo Contrast  Result Date: 07/13/2021 CLINICAL DATA:  Nausea, weakness and weight loss for the past week. Concern for acute  mesenteric ischemia. EXAM: CTA ABDOMEN AND PELVIS WITHOUT AND WITH CONTRAST TECHNIQUE: Multidetector CT imaging of the abdomen and pelvis was performed using the standard protocol during bolus administration of intravenous contrast. Multiplanar reconstructed images and  MIPs were obtained and reviewed to evaluate the vascular anatomy. CONTRAST:  20mL OMNIPAQUE IOHEXOL 300 MG/ML  SOLN COMPARISON:  CT abdomen pelvis-07/09/2021; 05/31/2021; 05/11/2021 FINDINGS: VASCULAR Aorta: There is a moderate amount predominantly calcified atherosclerotic plaque within normal caliber abdominal aorta, not resulting in a hemodynamically significant stenosis. No abdominal aortic dissection or periaortic stranding. Celiac: Widely patent without a hemodynamically significant narrowing. Conventional branching pattern. SMA: Widely patent without hemodynamically significant narrowing. Conventional branching pattern. The distal tributaries the SMA appear widely patent without discrete lumen filling defect to suggest distal embolism. Renals: Solitary bilaterally; there is a large amount of eccentric noncalcified atherosclerotic plaque involving the origin of the left renal artery resulting in short segment severe (greater 75%) luminal narrowing (axial image 74, series 4) and while this finding may be associated with slight asymmetric left-sided renal atrophy, there is no definitive delayed renal enhancement. The right renal artery is widely patent without hemodynamically significant narrowing. No vessel irregularity to suggest FMD. 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 Inflow: There is a minimal amount of eccentric calcified and noncalcified atherosclerotic plaque involving the bilateral common iliac arteries, not resulting in a hemodynamically significant stenosis. The bilateral internal iliac arteries are mildly disease though patent and of normal caliber. The bilateral external iliac arteries are of normal caliber and widely patent without hemodynamically significant narrowing. Proximal Outflow: There is a minimal amount of eccentric mixed calcified and noncalcified atherosclerotic plaque involving the bilateral common femoral arteries, not resulting in  a hemodynamically significant stenosis. Eccentric calcified and noncalcified atherosclerotic plaque approaches 50% luminal narrowing involving the proximal aspects of the bilateral superficial femoral arteries (left image 204, series 4; right image 207, series 4). Veins: The IVC and pelvic venous systems appear patent. Review of the MIP images confirms the above findings. _________________________________________________________ NON-VASCULAR Lower chest: Limited visualization of the lower thorax demonstrates minimal subpleural ground-glass atelectasis. No discrete focal airspace opacities. No pleural effusion. Normal heart size.  No pericardial effusion. Hepatobiliary: There is diffuse decreased attenuation of the hepatic parenchyma compatible with hepatic steatosis. No discrete hyperenhancing hepatic lesions. Normal early arterial phase appearance of the gallbladder given degree distention. No radiopaque gallstones. No intra or extrahepatic biliary duct dilatation. No ascites. Pancreas: The pancreas appears somewhat atretic but without definitive evidence of peripancreatic stranding on this early arterial phase examination. Spleen: Normal appearance of the spleen. No evidence of splenomegaly. Adrenals/Urinary Tract: There is symmetric enhancement of the bilateral kidneys. Suspected mild atrophy of the left kidney in comparison to the right. No evidence of nephrolithiasis on this postcontrast examination. No discrete renal lesions. There is a very minimal amount of bilateral perinephric stranding, likely age and body habitus related. No evidence of urinary obstruction. Normal appearance of the bilateral adrenal glands. Normal appearance of the urinary bladder given degree of distention. Stomach/Bowel: Mild gaseous distension of predominantly the transverse colon, without evidence of enteric obstruction. Normal appearance of the terminal ileum and the retrocecal appendix. No discrete areas of bowel wall thickening.  No hiatal hernia. No pneumoperitoneum, pneumatosis or portal venous gas. Lymphatic: No bulky retroperitoneal, mesenteric, pelvic or inguinal lymphadenopathy. Reproductive: Redemonstrated dystrophic fibroid within the right-side of the uterus. No discrete adnexal lesions. No free fluid the pelvic cul-de-sac. Other: Mild diffuse body wall  anasarca, most conspicuous about the midline of the low back. Tiny mesenteric fat containing periumbilical hernia. Musculoskeletal: No acute or aggressive osseous abnormalities. Stigmata of dish within the lower thoracic spine. Mild-to-moderate degenerative change the bilateral hips with joint space loss, subchondral sclerosis and osteophytosis. IMPRESSION: VASCULAR 1. Moderate amount of atherosclerotic plaque within a normal caliber abdominal aorta, not resulting in a hemodynamically significant stenosis. 2. No evidence of acute or chronic mesenteric ischemia. 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 Both the SMA and celiac arteries are widely patent without a hemodynamically significant stenosis. 3. Noncalcified atherosclerotic plaque results in short segment severe (greater 70%) luminal narrowing of the left renal artery with associated mild asymmetric renal atrophy but without associated delayed renal enhancement. 4. Atherosclerotic plaque involving the proximal aspects of the bilateral superficial femoral arteries results in approximately 50% luminal narrowing bilaterally. Correlation for symptoms of PAD is advised. NON-VASCULAR 1. No acute findings within the abdomen or pelvis. Specifically, no evidence of enteric or urinary obstruction. 2. No definitive evidence of recurrent acute or chronic pancreatitis. Further evaluation with acquisition of amylase and lipase levels could be performed as indicated. 3. Advanced hepatic steatosis.  Correlation with LFTs is advised. Electronically Signed   By: Sandi Mariscal M.D.    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  She did meet SIRS criteria on admission with no identified source, procalcitonin elevated, now on empiric antibiotics.  Blood cultures pending.  Etiology of encephalopathy is not clear.  Ammonia was within normal limits.  MRI brain with no acute findings.  Possibly secondary to initial dehydration, now significantly improved, though sister reports she has not quite back to her baseline yet. She is A&O x3 today.   N/V/abdominal pain:  Symptoms have been worsening since November.  Over the last week, she has had daily nausea, vomiting, constant RUQ abdominal pain, inability to keep foods or medications down.  Drinking very little liquid.  Etiology of worsening symptoms is unclear.  Reports compliance with PPI BID, but frequently vomiting her medications back up which has led to uncontrolled GERD as well. No hematemesis, brbpr, or melena.  CTA abdomen pelvis which showed no acute findings in the abdomen pelvis, no evidence of acute or chronic mesenteric ischemia or hemodynamically significant stenosis of aorta.  At this time, would recommend repeating an EGD to evaluate for recurrent pyloric stenosis, PUD, gastritis, duodenitis.  If unrevealing, she would likely benefit from seeing vascular again as outpatient.  Encouragingly, her nausea and vomiting have resolved since admission, but abdominal pain  continues.  History of advanced fibrosis:  With history of HCV s/p eradication, ETOH use, fatty liver. Platelets andspleen normal. Portal gastropathy on EGD in 2021, but it is felt dealing more with advanced fibrosis instead of cirrhosis.   Anemia:  Hemoglobin 11.3 on admission with macrocytic indices, down to 8.6 today.  Denies overt GI bleeding.  Likely hemoconcentrated on admission, now s/p IV fluid resuscitation with hemodilution.  Notably, folate was  low and vitamin B12 elevated on 12/12. No recent iron panel on file.  We are planning on EGD this admission due to N/V/abdominal pain.  Colonoscopy is up-to-date in 2019 with 1 inflammatory polyp, recommended repeat 5 years. Continue to monitor.    Plan: Continue clear liquids today.  N.p.o. at midnight. EGD with propofol with Dr. Marchia Bond needed tomorrow. The risks, benefits, and alternatives have been discussed with the patient in detail. The patient states understanding and desires to proceed.  Continue IV PPI daily for now pending EGD findings. Continue supportive measures. Continue Folic acid and thiamine. Check iron panel Continue to monitor H/H and for overt GI bleeding.    LOS: 0 days    07/14/2021, 1:57 PM   Aliene Altes, [facility]

## 2021-07-14 NOTE — Hospital Course (Signed)
Colleen Lee is a 65 y.o. female with a history of alcoholic liver cirrhosis, asthma, [name], hypertension. Patient presented secondary to vomiting and abdominal pain. On admission she was also suffering from metabolic encephalopathy. CT abdomen/pelvis without acute process identified. GI consulted on admission. SIRS criteria met without source. Procalcitonin elevated. Ceftriaxone/Flagyl initiated. Blood cultures pending.

## 2021-07-14 NOTE — Assessment & Plan Note (Signed)
Noted  

## 2021-07-14 NOTE — Progress Notes (Signed)
PROGRESS NOTE    LEEZA HEINER  ZOX:[id number] DOB: 1956-02-06 DOA: 07/13/2021 PCP: Sandi Mariscal, MD   Brief Narrative: Colleen Lee is a 65 y.o. female with a history of alcoholic liver cirrhosis, asthma, [name], hypertension. Patient presented secondary to vomiting and abdominal pain. On admission she was also suffering from metabolic encephalopathy. CT abdomen/pelvis without acute process identified. GI consulted on admission. SIRS criteria met without source. Procalcitonin elevated. Ceftriaxone/Flagyl initiated. Blood cultures pending.   Assessment & Plan:   * Intractable vomiting Unknown etiology. Improved today. GI consulted and recommendations are pending today. Recommendations yesterday for consideration of endoscopic evaluation.  SIRS (systemic inflammatory response syndrome) (HCC) No source identified. Imaging with no specific findings. Patient symptoms non-specific. Mildly elevated WBC with significantly elevated procalcitonin (31.42). One set of blood cultures obtained last night. No urine culture obtained secondary to unremarkable urinalysis. -Repeat Blood culture sets -Start empiric Ceftriaxone/Flagyl for possible intra-abdominal source -Trend procalcitonin  Acute metabolic encephalopathy Unknown etiology. Normal ammonia. Possibly related to initial dehydration as she has significantly improved and appears to be at baseline. Will hold high dose thiamine for now since mental status has improved. No ascites, but did have abdominal pain. -Check vitamin B1  Weakness -PT/OT eval  Essential hypertension -Continue Labetalol 811 mg BD  Alcoholic cirrhosis of liver without ascites (HCC) Normal LFTs today. Bilirubin of 1. INR not obtained. Patient does have pain mostly in RUQ, but labs not consistent with acute hepatitis. CT with evidence of anasarca -Follow-up INR -Strict in/out and daily weights  Chronic [name] with cirrhosis ([place]) Noted.    DVT  prophylaxis: Lovenox Code Status:   Code Status: Full Code Family Communication: None at bedside Disposition Plan: Discharge home vs SNF likely in 3-5 days pending workup for nausea/vomiting and abdominal pain, in addition to SIRS workup   Consultants:  Gastroenterology  Procedures:  None  Antimicrobials: Ceftriaxone Flagyl    Subjective: Abdominal pain. No nausea at this time.  Objective: Vitals:   07/13/21 2015 07/13/21 2202 07/14/21 0015 07/14/21 0400  BP: (!) 151/105 (!) 151/110 114/78 100/71  Pulse: (!) 106 (!) 104 91 79  Resp: $Remo'16 16 16 16  'iuPeV$ Temp: 99.2 F (37.3 C) 99.7 F (37.6 C) 99.3 F (37.4 C) 99.3 F (37.4 C)  TempSrc: Oral Axillary Axillary Axillary  SpO2: 100% 100% 100% 100%  Weight:      Height:        Intake/Output Summary (Last 24 hours) at 07/14/2021 1144 Last data filed at 07/14/2021 0930 Gross per 24 hour  Intake 920 ml  Output --  Net 920 ml   Filed Weights   07/13/21 1049 07/13/21 1800  Weight: 63.5 kg 63.3 kg    Examination:  General exam: Appears calm and comfortable. Chronically ill appearing Respiratory system: Clear to auscultation. Respiratory effort normal. Cardiovascular system: S1 & S2 heard, RRR. No murmurs, rubs, gallops or clicks. Gastrointestinal system: Abdomen is distended especially in upper quadrants, soft and tender in right quadrants. Normal bowel sounds heard. Central nervous system: Alert and oriented. No focal neurological deficits. Musculoskeletal: No edema. No calf tenderness Skin: No cyanosis. No rashes Psychiatry: Judgement and insight appear normal. Mood & affect appropriate.     Data Reviewed: I have personally reviewed following labs and imaging studies  CBC Lab Results  Component Value Date   WBC 12.4 (H) 07/13/2021   RBC 3.43 (L) 07/13/2021   HGB 11.3 (L) 07/13/2021   HCT 34.4 (L) 07/13/2021   MCV 100.3 (  H) 07/13/2021   MCH 32.9 07/13/2021   PLT 297 07/13/2021   MCHC 32.8 07/13/2021   RDW 18.6  (H) 07/13/2021   LYMPHSABS 0.6 (L) 07/09/2021   MONOABS 0.4 07/09/2021   EOSABS 0.0 07/09/2021   BASOSABS 0.0 53/97/6734     Last metabolic panel Lab Results  Component Value Date   NA 133 (L) 07/13/2021   K 4.0 07/13/2021   CL 105 07/13/2021   CO2 18 (L) 07/13/2021   BUN 14 07/13/2021   CREATININE 1.00 07/13/2021   GLUCOSE 81 07/13/2021   GFRNONAA >60 07/13/2021   GFRAA 76 12/19/2019   CALCIUM 11.0 (H) 07/13/2021   PHOS 2.7 01/03/2021   PROT 6.9 07/13/2021   ALBUMIN 2.6 (L) 07/13/2021   BILITOT 1.0 07/13/2021   ALKPHOS 103 07/13/2021   AST 40 07/13/2021   ALT 21 07/13/2021   ANIONGAP 10 07/13/2021    CBG (last 3)  Recent Labs    07/13/21 2302 07/14/21 0801 07/14/21 1104  GLUCAP 80 191* 191*     GFR: Estimated Creatinine Clearance: 56 mL/min (by C-G formula based on SCr of 1 mg/dL).  Coagulation Profile: No results for input(s): INR, PROTIME in the last 168 hours.  Recent Results (from the past 240 hour(s))  Resp Panel by RT-PCR (Flu A&B, Covid) Nasopharyngeal Swab     Status: None   Collection Time: 07/09/21  1:39 PM   Specimen: Nasopharyngeal Swab; Nasopharyngeal(NP) swabs in vial transport medium  Result Value Ref Range Status   SARS Coronavirus 2 by RT PCR NEGATIVE NEGATIVE Final    Comment: (NOTE) SARS-CoV-2 target nucleic acids are NOT DETECTED.  The SARS-CoV-2 RNA is generally detectable in upper respiratory specimens during the acute phase of infection. The lowest concentration of SARS-CoV-2 viral copies this assay can detect is 138 copies/mL. A negative result does not preclude SARS-Cov-2 infection and should not be used as the sole basis for treatment or other patient management decisions. A negative result may occur with  improper specimen collection/handling, submission of specimen other than nasopharyngeal swab, presence of viral mutation(s) within the areas targeted by this assay, and inadequate number of viral copies(<138 copies/mL). A  negative result must be combined with clinical observations, patient history, and epidemiological information. The expected result is Negative.  Fact Sheet for Patients:  EntrepreneurPulse.com.au  Fact Sheet for Healthcare Providers:  IncredibleEmployment.be  This test is no t yet approved or cleared by the [place] FDA and  has been authorized for detection and/or diagnosis of SARS-CoV-2 by FDA under an Emergency Use Authorization (EUA). This EUA will remain  in effect (meaning this test can be used) for the duration of the COVID-19 declaration under Section 564(b)(1) of the Act, 21 U.S.C.section 360bbb-3(b)(1), unless the authorization is terminated  or revoked sooner.       Influenza A by PCR NEGATIVE NEGATIVE Final   Influenza B by PCR NEGATIVE NEGATIVE Final    Comment: (NOTE) The Xpert Xpress SARS-CoV-2/FLU/RSV plus assay is intended as an aid in the diagnosis of influenza from Nasopharyngeal swab specimens and should not be used as a sole basis for treatment. Nasal washings and aspirates are unacceptable for Xpert Xpress SARS-CoV-2/FLU/RSV testing.  Fact Sheet for Patients: EntrepreneurPulse.com.au  Fact Sheet for Healthcare Providers: IncredibleEmployment.be  This test is not yet approved or cleared by the [place] FDA and has been authorized for detection and/or diagnosis of SARS-CoV-2 by FDA under an Emergency Use Authorization (EUA). This EUA will remain in effect (meaning this test  can be used) for the duration of the COVID-19 declaration under Section 564(b)(1) of the Act, 21 U.S.C. section 360bbb-3(b)(1), unless the authorization is terminated or revoked.  Performed at [facility], [address], [place], [place] [address]   Resp Panel by RT-PCR (Flu A&B, Covid) Nasopharyngeal Swab     Status: None   Collection Time: 07/13/21  3:30 PM   Specimen: Nasopharyngeal Swab;  Nasopharyngeal(NP) swabs in vial transport medium  Result Value Ref Range Status   SARS Coronavirus 2 by RT PCR NEGATIVE NEGATIVE Final    Comment: (NOTE) SARS-CoV-2 target nucleic acids are NOT DETECTED.  The SARS-CoV-2 RNA is generally detectable in upper respiratory specimens during the acute phase of infection. The lowest concentration of SARS-CoV-2 viral copies this assay can detect is 138 copies/mL. A negative result does not preclude SARS-Cov-2 infection and should not be used as the sole basis for treatment or other patient management decisions. A negative result may occur with  improper specimen collection/handling, submission of specimen other than nasopharyngeal swab, presence of viral mutation(s) within the areas targeted by this assay, and inadequate number of viral copies(<138 copies/mL). A negative result must be combined with clinical observations, patient history, and epidemiological information. The expected result is Negative.  Fact Sheet for Patients:  EntrepreneurPulse.com.au  Fact Sheet for Healthcare Providers:  IncredibleEmployment.be  This test is no t yet approved or cleared by the [place] FDA and  has been authorized for detection and/or diagnosis of SARS-CoV-2 by FDA under an Emergency Use Authorization (EUA). This EUA will remain  in effect (meaning this test can be used) for the duration of the COVID-19 declaration under Section 564(b)(1) of the Act, 21 U.S.C.section 360bbb-3(b)(1), unless the authorization is terminated  or revoked sooner.       Influenza A by PCR NEGATIVE NEGATIVE Final   Influenza B by PCR NEGATIVE NEGATIVE Final    Comment: (NOTE) The Xpert Xpress SARS-CoV-2/FLU/RSV plus assay is intended as an aid in the diagnosis of influenza from Nasopharyngeal swab specimens and should not be used as a sole basis for treatment. Nasal washings and aspirates are unacceptable for Xpert Xpress  SARS-CoV-2/FLU/RSV testing.  Fact Sheet for Patients: EntrepreneurPulse.com.au  Fact Sheet for Healthcare Providers: IncredibleEmployment.be  This test is not yet approved or cleared by the [place] FDA and has been authorized for detection and/or diagnosis of SARS-CoV-2 by FDA under an Emergency Use Authorization (EUA). This EUA will remain in effect (meaning this test can be used) for the duration of the COVID-19 declaration under Section 564(b)(1) of the Act, 21 U.S.C. section 360bbb-3(b)(1), unless the authorization is terminated or revoked.  Performed at [facility], [address], [place], [place] [address]   Culture, blood (routine x 2)     Status: None (Preliminary result)   Collection Time: 07/13/21 10:53 PM   Specimen: BLOOD LEFT WRIST  Result Value Ref Range Status   Specimen Description BLOOD LEFT WRIST  Final   Special Requests   Final    BOTTLES DRAWN AEROBIC AND ANAEROBIC Blood Culture results may not be optimal due to an inadequate volume of blood received in culture bottles   Culture   Final    NO GROWTH < 12 HOURS Performed at [facility], [address] [place], [place] [address]    Report Status PENDING  Incomplete        Radiology Studies: MR BRAIN WO CONTRAST  Result Date: 07/13/2021 CLINICAL DATA:  Mental status changes. Lethargy. Speech disturbance. Lower  extremity weakness. EXAM: MRI HEAD WITHOUT CONTRAST TECHNIQUE: Multiplanar, multiecho pulse sequences of the brain and surrounding structures were obtained without intravenous contrast. COMPARISON:  Head CT 05/12/2014 FINDINGS: Brain: Diffusion imaging does not show any acute or subacute infarction. Chronic small-vessel ischemic changes are seen throughout the pons. No focal cerebellar finding. Cerebral hemispheres show atrophy with mild to moderate chronic small-vessel ischemic changes of the white matter. No cortical or large vessel territory infarction. No  mass lesion, hemorrhage, hydrocephalus or extra-axial collection. Vascular: Flow is present in both internal carotid arteries. Dominant left vertebral artery shows flow. There may not be flow in the small right vertebral artery. Skull and upper cervical spine: Normal Sinuses/Orbits: Clear/normal Other: None IMPRESSION: No acute brain finding. Atrophy. Chronic small-vessel ischemic changes of the pons and cerebral hemispheric white matter. Question abnormal flow in the non dominant right vertebral artery. Dominant left vertebral artery appears widely patent to the basilar. Electronically Signed   By: Nelson Chimes M.D.   On: 07/13/2021 18:28   CT Angio Abd/Pel W and/or Wo Contrast  Result Date: 07/13/2021 CLINICAL DATA:  Nausea, weakness and weight loss for the past week. Concern for acute mesenteric ischemia. EXAM: CTA ABDOMEN AND PELVIS WITHOUT AND WITH CONTRAST TECHNIQUE: Multidetector CT imaging of the abdomen and pelvis was performed using the standard protocol during bolus administration of intravenous contrast. Multiplanar reconstructed images and MIPs were obtained and reviewed to evaluate the vascular anatomy. CONTRAST:  24mL OMNIPAQUE IOHEXOL 300 MG/ML  SOLN COMPARISON:  CT abdomen pelvis-07/09/2021; 05/31/2021; 05/11/2021 FINDINGS: VASCULAR Aorta: There is a moderate amount predominantly calcified atherosclerotic plaque within normal caliber abdominal aorta, not resulting in a hemodynamically significant stenosis. No abdominal aortic dissection or periaortic stranding. Celiac: Widely patent without a hemodynamically significant narrowing. Conventional branching pattern. SMA: Widely patent without hemodynamically significant narrowing. Conventional branching pattern. The distal tributaries the SMA appear widely patent without discrete lumen filling defect to suggest distal embolism. Renals: Solitary bilaterally; there is a large amount of eccentric noncalcified atherosclerotic plaque involving the  origin of the left renal artery resulting in short segment severe (greater 75%) luminal narrowing (axial image 74, series 4) and while this finding may be associated with slight asymmetric left-sided renal atrophy, there is no definitive delayed renal enhancement. The right renal artery is widely patent without hemodynamically significant narrowing. No vessel irregularity to suggest FMD. 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 Inflow: There is a minimal amount of eccentric calcified and noncalcified atherosclerotic plaque involving the bilateral common iliac arteries, not resulting in a hemodynamically significant stenosis. The bilateral internal iliac arteries are mildly disease though patent and of normal caliber. The bilateral external iliac arteries are of normal caliber and widely patent without hemodynamically significant narrowing. Proximal Outflow: There is a minimal amount of eccentric mixed calcified and noncalcified atherosclerotic plaque involving the bilateral common femoral arteries, not resulting in a hemodynamically significant stenosis. Eccentric calcified and noncalcified atherosclerotic plaque approaches 50% luminal narrowing involving the proximal aspects of the bilateral superficial femoral arteries (left image 204, series 4; right image 207, series 4). Veins: The IVC and pelvic venous systems appear patent. Review of the MIP images confirms the above findings. _________________________________________________________ NON-VASCULAR Lower chest: Limited visualization of the lower thorax demonstrates minimal subpleural ground-glass atelectasis. No discrete focal airspace opacities. No pleural effusion. Normal heart size.  No pericardial effusion. Hepatobiliary: There is diffuse decreased attenuation of the hepatic parenchyma compatible with hepatic steatosis. No discrete hyperenhancing hepatic lesions.  Normal early arterial phase appearance of the  gallbladder given degree distention. No radiopaque gallstones. No intra or extrahepatic biliary duct dilatation. No ascites. Pancreas: The pancreas appears somewhat atretic but without definitive evidence of peripancreatic stranding on this early arterial phase examination. Spleen: Normal appearance of the spleen. No evidence of splenomegaly. Adrenals/Urinary Tract: There is symmetric enhancement of the bilateral kidneys. Suspected mild atrophy of the left kidney in comparison to the right. No evidence of nephrolithiasis on this postcontrast examination. No discrete renal lesions. There is a very minimal amount of bilateral perinephric stranding, likely age and body habitus related. No evidence of urinary obstruction. Normal appearance of the bilateral adrenal glands. Normal appearance of the urinary bladder given degree of distention. Stomach/Bowel: Mild gaseous distension of predominantly the transverse colon, without evidence of enteric obstruction. Normal appearance of the terminal ileum and the retrocecal appendix. No discrete areas of bowel wall thickening. No hiatal hernia. No pneumoperitoneum, pneumatosis or portal venous gas. Lymphatic: No bulky retroperitoneal, mesenteric, pelvic or inguinal lymphadenopathy. Reproductive: Redemonstrated dystrophic fibroid within the right-side of the uterus. No discrete adnexal lesions. No free fluid the pelvic cul-de-sac. Other: Mild diffuse body wall anasarca, most conspicuous about the midline of the low back. Tiny mesenteric fat containing periumbilical hernia. Musculoskeletal: No acute or aggressive osseous abnormalities. Stigmata of dish within the lower thoracic spine. Mild-to-moderate degenerative change the bilateral hips with joint space loss, subchondral sclerosis and osteophytosis. IMPRESSION: VASCULAR 1. Moderate amount of atherosclerotic plaque within a normal caliber abdominal aorta, not resulting in a hemodynamically significant stenosis. 2. No evidence of  acute or chronic mesenteric ischemia. 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 Both the SMA and celiac arteries are widely patent without a hemodynamically significant stenosis. 3. Noncalcified atherosclerotic plaque results in short segment severe (greater 70%) luminal narrowing of the left renal artery with associated mild asymmetric renal atrophy but without associated delayed renal enhancement. 4. Atherosclerotic plaque involving the proximal aspects of the bilateral superficial femoral arteries results in approximately 50% luminal narrowing bilaterally. Correlation for symptoms of PAD is advised. NON-VASCULAR 1. No acute findings within the abdomen or pelvis. Specifically, no evidence of enteric or urinary obstruction. 2. No definitive evidence of recurrent acute or chronic pancreatitis. Further evaluation with acquisition of amylase and lipase levels could be performed as indicated. 3. Advanced hepatic steatosis.  Correlation with LFTs is advised. Electronically Signed   By: Sandi Mariscal M.D.   On: 07/13/2021 16:03        Scheduled Meds:  allopurinol  300 mg Oral Daily   enoxaparin (LOVENOX) injection  40 mg Subcutaneous Q24H   feeding supplement  1 Container Oral TID BM   folic acid  1 mg Oral Daily   hydrALAZINE  50 mg Oral Q8H   labetalol  100 mg Oral BID   megestrol  40 mg Oral Daily   pantoprazole (PROTONIX) IV  40 mg Intravenous Q24H   thiamine injection  100 mg Intravenous Daily   Continuous Infusions:  dextrose 5 % and 0.9% NaCl 100 mL/hr at 07/13/21 2215   sodium chloride       LOS: 0 days     Cordelia Poche, MD [facility] 07/14/2021, 11:44 AM  If 7PM-7AM, please contact night-coverage [email or web address]

## 2021-07-15 ENCOUNTER — Encounter (HOSPITAL_COMMUNITY)
Admission: EM | Disposition: A | Discharge status: Home Health | Payer: Self-pay | Source: Home / Self Care | Attending: Family Medicine

## 2021-07-15 ENCOUNTER — Observation Stay (HOSPITAL_COMMUNITY): Payer: Medicare Other | Admitting: Anesthesiology

## 2021-07-15 ENCOUNTER — Ambulatory Visit: Payer: Self-pay | Admitting: *Deleted

## 2021-07-15 ENCOUNTER — Encounter (HOSPITAL_COMMUNITY): Payer: Self-pay | Admitting: Internal Medicine

## 2021-07-15 ENCOUNTER — Other Ambulatory Visit: Payer: Self-pay | Admitting: *Deleted

## 2021-07-15 DIAGNOSIS — B3789 Other sites of candidiasis: Secondary | ICD-10-CM | POA: Diagnosis not present

## 2021-07-15 DIAGNOSIS — E860 Dehydration: Secondary | ICD-10-CM | POA: Diagnosis not present

## 2021-07-15 DIAGNOSIS — Z8042 Family history of malignant neoplasm of prostate: Secondary | ICD-10-CM | POA: Diagnosis not present

## 2021-07-15 DIAGNOSIS — K766 Portal hypertension: Secondary | ICD-10-CM | POA: Diagnosis not present

## 2021-07-15 DIAGNOSIS — Z800 Family history of malignant neoplasm of digestive organs: Secondary | ICD-10-CM | POA: Diagnosis not present

## 2021-07-15 DIAGNOSIS — Z96652 Presence of left artificial knee joint: Secondary | ICD-10-CM | POA: Diagnosis present

## 2021-07-15 DIAGNOSIS — B182 Chronic viral hepatitis C: Secondary | ICD-10-CM | POA: Diagnosis present

## 2021-07-15 DIAGNOSIS — Z20822 Contact with and (suspected) exposure to covid-19: Secondary | ICD-10-CM | POA: Diagnosis not present

## 2021-07-15 DIAGNOSIS — K3189 Other diseases of stomach and duodenum: Secondary | ICD-10-CM | POA: Diagnosis not present

## 2021-07-15 DIAGNOSIS — Z825 Family history of asthma and other chronic lower respiratory diseases: Secondary | ICD-10-CM | POA: Diagnosis not present

## 2021-07-15 DIAGNOSIS — R1013 Epigastric pain: Secondary | ICD-10-CM

## 2021-07-15 DIAGNOSIS — L929 Granulomatous disorder of the skin and subcutaneous tissue, unspecified: Secondary | ICD-10-CM | POA: Diagnosis not present

## 2021-07-15 DIAGNOSIS — K2210 Ulcer of esophagus without bleeding: Secondary | ICD-10-CM | POA: Diagnosis not present

## 2021-07-15 DIAGNOSIS — R112 Nausea with vomiting, unspecified: Secondary | ICD-10-CM

## 2021-07-15 DIAGNOSIS — T18128A Food in esophagus causing other injury, initial encounter: Secondary | ICD-10-CM

## 2021-07-15 DIAGNOSIS — R1110 Vomiting, unspecified: Secondary | ICD-10-CM | POA: Diagnosis not present

## 2021-07-15 DIAGNOSIS — F17200 Nicotine dependence, unspecified, uncomplicated: Secondary | ICD-10-CM | POA: Diagnosis not present

## 2021-07-15 DIAGNOSIS — K449 Diaphragmatic hernia without obstruction or gangrene: Secondary | ICD-10-CM | POA: Diagnosis present

## 2021-07-15 DIAGNOSIS — J45909 Unspecified asthma, uncomplicated: Secondary | ICD-10-CM | POA: Diagnosis not present

## 2021-07-15 DIAGNOSIS — R627 Adult failure to thrive: Secondary | ICD-10-CM | POA: Diagnosis not present

## 2021-07-15 DIAGNOSIS — Z880 Allergy status to penicillin: Secondary | ICD-10-CM | POA: Diagnosis not present

## 2021-07-15 DIAGNOSIS — K7030 Alcoholic cirrhosis of liver without ascites: Secondary | ICD-10-CM | POA: Diagnosis present

## 2021-07-15 DIAGNOSIS — G9341 Metabolic encephalopathy: Secondary | ICD-10-CM | POA: Diagnosis not present

## 2021-07-15 DIAGNOSIS — R5381 Other malaise: Secondary | ICD-10-CM | POA: Diagnosis present

## 2021-07-15 DIAGNOSIS — B3781 Candidal esophagitis: Secondary | ICD-10-CM | POA: Diagnosis not present

## 2021-07-15 DIAGNOSIS — I10 Essential (primary) hypertension: Secondary | ICD-10-CM | POA: Diagnosis not present

## 2021-07-15 DIAGNOSIS — R601 Generalized edema: Secondary | ICD-10-CM | POA: Diagnosis present

## 2021-07-15 DIAGNOSIS — Z807 Family history of other malignant neoplasms of lymphoid, hematopoietic and related tissues: Secondary | ICD-10-CM | POA: Diagnosis not present

## 2021-07-15 DIAGNOSIS — D649 Anemia, unspecified: Secondary | ICD-10-CM | POA: Diagnosis not present

## 2021-07-15 DIAGNOSIS — R6510 Systemic inflammatory response syndrome (SIRS) of non-infectious origin without acute organ dysfunction: Secondary | ICD-10-CM | POA: Diagnosis not present

## 2021-07-15 DIAGNOSIS — Z79899 Other long term (current) drug therapy: Secondary | ICD-10-CM | POA: Diagnosis not present

## 2021-07-15 DIAGNOSIS — F17210 Nicotine dependence, cigarettes, uncomplicated: Secondary | ICD-10-CM | POA: Diagnosis present

## 2021-07-15 DIAGNOSIS — K760 Fatty (change of) liver, not elsewhere classified: Secondary | ICD-10-CM | POA: Diagnosis present

## 2021-07-15 HISTORY — PX: BIOPSY: SHX5522

## 2021-07-15 HISTORY — PX: ESOPHAGOGASTRODUODENOSCOPY (EGD) WITH PROPOFOL: SHX5813

## 2021-07-15 LAB — COMPREHENSIVE METABOLIC PANEL
ALT: 16 U/L (ref 0–44)
AST: 25 U/L (ref 15–41)
Albumin: 1.9 g/dL — ABNORMAL LOW (ref 3.5–5.0)
Alkaline Phosphatase: 80 U/L (ref 38–126)
Anion gap: 7 (ref 5–15)
BUN: 8 mg/dL (ref 8–23)
CO2: 16 mmol/L — ABNORMAL LOW (ref 22–32)
Calcium: 9.6 mg/dL (ref 8.9–10.3)
Chloride: 109 mmol/L (ref 98–111)
Creatinine, Ser: 0.66 mg/dL (ref 0.44–1.00)
GFR, Estimated: >60 mL/min (ref >60)
Glucose, Bld: 94 mg/dL (ref 70–99)
Potassium: 3.2 mmol/L — ABNORMAL LOW (ref 3.5–5.1)
Sodium: 132 mmol/L — ABNORMAL LOW (ref 135–145)
Total Bilirubin: 0.6 mg/dL (ref 0.3–1.2)
Total Protein: 5.4 g/dL — ABNORMAL LOW (ref 6.5–8.1)

## 2021-07-15 LAB — CBC
HCT: 26.6 % — ABNORMAL LOW (ref 36.0–46.0)
Hemoglobin: 9.1 g/dL — ABNORMAL LOW (ref 12.0–15.0)
MCH: 33.6 pg (ref 26.0–34.0)
MCHC: 34.2 g/dL (ref 30.0–36.0)
MCV: 98.2 fL (ref 80.0–100.0)
Platelets: 199 10*3/uL (ref 150–400)
RBC: 2.71 MIL/uL — ABNORMAL LOW (ref 3.87–5.11)
RDW: 19 % — ABNORMAL HIGH (ref 11.5–15.5)
WBC: 7.1 10*3/uL (ref 4.0–10.5)
nRBC: 0 % (ref 0.0–0.2)

## 2021-07-15 LAB — GLUCOSE, CAPILLARY: Glucose-Capillary: 99 mg/dL (ref 70–99)

## 2021-07-15 LAB — IRON AND TIBC: Iron: 25 ug/dL — ABNORMAL LOW (ref 28–170)

## 2021-07-15 LAB — PROTIME-INR
INR: 1.2 (ref 0.8–1.2)
Prothrombin Time: 15.3 seconds — ABNORMAL HIGH (ref 11.4–15.2)

## 2021-07-15 LAB — MAGNESIUM: Magnesium: 1.6 mg/dL — ABNORMAL LOW (ref 1.7–2.4)

## 2021-07-15 LAB — FERRITIN: Ferritin: 596 ng/mL — ABNORMAL HIGH (ref 11–307)

## 2021-07-15 LAB — PROCALCITONIN: Procalcitonin: 8.49 ng/mL

## 2021-07-15 SURGERY — ESOPHAGOGASTRODUODENOSCOPY (EGD) WITH PROPOFOL
Anesthesia: General

## 2021-07-15 MED ORDER — PROPOFOL 10 MG/ML IV BOLUS
INTRAVENOUS | Status: DC | PRN
  Administered 2021-07-15: 30 mg via INTRAVENOUS
  Administered 2021-07-15: 70 mg via INTRAVENOUS

## 2021-07-15 MED ORDER — POTASSIUM CHLORIDE CRYS ER 20 MEQ PO TBCR
40.0000 meq | EXTENDED_RELEASE_TABLET | Freq: Once | ORAL | Status: AC
  Administered 2021-07-15: 16:00:00 40 meq via ORAL
  Filled 2021-07-15: qty 2

## 2021-07-15 MED ORDER — SODIUM CHLORIDE 0.9 % IV SOLN: INTRAVENOUS | Status: DC

## 2021-07-15 MED ORDER — ENSURE MAX PROTEIN PO LIQD
11.0000 [oz_av] | Freq: Every day | ORAL | Status: DC
  Administered 2021-07-15 – 2021-07-16 (×2): 11 [oz_av] via ORAL

## 2021-07-15 MED ORDER — LACTATED RINGERS IV SOLN: INTRAVENOUS | Status: DC

## 2021-07-15 MED ORDER — ADULT MULTIVITAMIN W/MINERALS CH
1.0000 | ORAL_TABLET | Freq: Every day | ORAL | Status: DC
  Administered 2021-07-15 – 2021-07-16 (×2): 1 via ORAL
  Filled 2021-07-15 (×2): qty 1

## 2021-07-15 MED ORDER — LIDOCAINE HCL (CARDIAC) PF 100 MG/5ML IV SOSY
PREFILLED_SYRINGE | INTRAVENOUS | Status: DC | PRN
  Administered 2021-07-15: 50 mg via INTRAVENOUS

## 2021-07-15 MED ORDER — POTASSIUM CHLORIDE CRYS ER 20 MEQ PO TBCR
40.0000 meq | EXTENDED_RELEASE_TABLET | ORAL | Status: AC
  Administered 2021-07-15 – 2021-07-16 (×2): 40 meq via ORAL
  Filled 2021-07-15 (×2): qty 2

## 2021-07-15 MED ORDER — POTASSIUM CHLORIDE 10 MEQ/100ML IV SOLN
10.0000 meq | INTRAVENOUS | Status: AC
  Administered 2021-07-15 (×2): 10 meq via INTRAVENOUS
  Filled 2021-07-15 (×2): qty 100

## 2021-07-15 NOTE — Brief Op Note (Signed)
07/13/2021 - 07/15/2021  11:14 AM  PATIENT:  Colleen Lee  65 y.o. female  PRE-OPERATIVE DIAGNOSIS:  nausea, vomiting and dysphagia  POST-OPERATIVE DIAGNOSIS:  portal hypertensive gastropathy; Hiatal hernia  PROCEDURE:  Procedure(s): ESOPHAGOGASTRODUODENOSCOPY (EGD) WITH PROPOFOL (N/A) BIOPSY  SURGEON:  Surgeon(s) and Role:    * Harvel Quale, MD - Primary  Patient underwent EGD under propofol sedation.  Tolerated the procedure adequately. White small clumps were attached to the esophageal wall (possibly food, less likely Candida) was found in the middle third of the esophagus.  Biopsies were taken with a cold forceps for histology.  There was a 1 cm hiatal hernia.  Findings consistent with portal hypertensive gastropathy throughout the stomach.  Normal duodenum.  RECOMMENDATIONS: - Return patient to hospital ward for ongoing care.  - Full liquid diet today.  - Await pathology results.  - Evaluation by speech and swallow therapist - consider modified barium swallow. - Zofran as needed for nausea.  Maylon Peppers, MD Gastroenterology and Hepatology [facility]

## 2021-07-15 NOTE — Op Note (Addendum)
[facility] Patient Name: Kinsie Belford Procedure Date: 07/15/2021 10:52 AM MRN: [id number] Date of Birth: [birth year] Attending MD: Maylon Peppers ,  CSN: [id number] Age: 65 Admit Type: Inpatient Procedure:                Upper GI endoscopy Indications:              Epigastric abdominal pain, Nausea with vomiting,                            Regurgitation Providers:                Maylon Peppers, Lurline Del, RN, Kristine L. Risa Grill, Technician Referring MD:              Medicines:                Monitored Anesthesia Care Complications:            No immediate complications. Estimated Blood Loss:     Estimated blood loss: none. Procedure:                Pre-Anesthesia Assessment:                           - Prior to the procedure, a History and Physical                            was performed, and patient medications, allergies                            and sensitivities were reviewed. The patient's                            tolerance of previous anesthesia was reviewed.                           - The risks and benefits of the procedure and the                            sedation options and risks were discussed with the                            patient. All questions were answered and informed                            consent was obtained.                           - ASA Grade Assessment: III - A patient with severe                            systemic disease.                           After obtaining informed consent, the endoscope was  passed under direct vision. Throughout the                            procedure, the patient's blood pressure, pulse, and                            oxygen saturations were monitored continuously. The                            [id number]-[id number] ([id number]) scope was introduced through the                            mouth, and advanced to the second part of duodenum.                             The upper GI endoscopy was accomplished without                            difficulty. The patient tolerated the procedure                            well. Scope In: 11:00:09 AM Scope Out: 11:06:40 AM Total Procedure Duration: 0 hours 6 minutes 31 seconds  Findings:      White small clumps were attached to the esophageal wall (possibly food,       less likely Candida) was found in the middle third of the esophagus.       Biopsies were taken with a cold forceps for histology.      A 1 cm hiatal hernia was present.      Diffuse portal hypertensive gastropathy was found in the entire examined       stomach.      The examined duodenum was normal. Impression:               - White contents attached to the wall of the middle                            third of the esophagus (?food). Biopsied.                           - 1 cm hiatal hernia.                           - Portal hypertensive gastropathy.                           - Normal examined duodenum. Moderate Sedation:      Per Anesthesia Care Recommendation:           - Return patient to hospital ward for ongoing care.                           - Full liquid diet today.                           - Await pathology results.                           -  Evaluation by speech and swallow therapist -                            consider modified barium swallow.                           - Zofran as needed for nausea. Procedure Code(s):        --- Professional ---                           [phone], Esophagogastroduodenoscopy, flexible,                            transoral; with biopsy, single or multiple Diagnosis Code(s):        --- Professional ---                           I94.854O, Food in esophagus causing other injury,                            initial encounter                           K76.6, Portal hypertension                           K31.89, Other diseases of stomach and duodenum                           R10.13, Epigastric  pain                           R11.2, Nausea with vomiting, unspecified                           R11.10, Vomiting, unspecified CPT copyright 2019 American Medical Association. All rights reserved. The codes documented in this report are preliminary and upon coder review may  be revised to meet current compliance requirements. Maylon Peppers, MD Maylon Peppers,  07/15/2021 11:16:50 AM This report has been signed electronically. Number of Addenda: 0

## 2021-07-15 NOTE — Anesthesia Preprocedure Evaluation (Addendum)
Anesthesia Evaluation  Patient identified by MRN, date of birth, ID band Patient awake    Reviewed: Allergy & Precautions, NPO status , [name] Chart, lab work & pertinent test results  History of Anesthesia Complications (+) PONV and history of anesthetic complications  Airway Mallampati: II  TM Distance: >3 FB     Dental  (+) Missing, Dental Advisory Given   Pulmonary asthma , Current Smoker and Patient abstained from smoking.,    Pulmonary exam normal breath sounds clear to auscultation       Cardiovascular hypertension, Pt. on medications Normal cardiovascular exam Rhythm:Regular Rate:Normal  13-Jul-2021 11:19:06 [facility]-AP-ED ROUTINE RECORD 06-04-1956 (37 yr) Female Black Vent. rate 115 BPM PR interval 134 ms QRS duration 84 ms QT/QTcB 310/429 ms P-R-T axes 69 69 77 Sinus tachycardia Consider left ventricular hypertrophy   Neuro/Psych  Neuromuscular disease CVA negative psych ROS   GI/Hepatic GERD  Medicated,(+) Cirrhosis     substance abuse  alcohol use and marijuana use, Hepatitis -, C  Endo/Other    Renal/GU Renal disease     Musculoskeletal  (+) Arthritis , Osteoarthritis,  Fibromyalgia -  Abdominal   Peds  Hematology  (+) Blood dyscrasia, anemia ,   Anesthesia Other Findings   Reproductive/Obstetrics                            Anesthesia Physical Anesthesia Plan  ASA: 4  Anesthesia Plan: General   Post-op Pain Management: Minimal or no pain anticipated   Induction: Intravenous  PONV Risk Score and Plan: TIVA  Airway Management Planned: Nasal Cannula and Natural Airway  Additional Equipment:   Intra-op Plan:   Post-operative Plan: Possible Post-op intubation/ventilation  Informed Consent: I have reviewed the patients History and Physical, chart, labs and discussed the procedure including the risks, benefits and alternatives for the  proposed anesthesia with the patient or authorized representative who has indicated his/her understanding and acceptance.     Dental advisory given  Plan Discussed with: CRNA and Surgeon  Anesthesia Plan Comments:         Anesthesia Quick Evaluation

## 2021-07-15 NOTE — Progress Notes (Signed)
We will proceed with EGD as scheduled.  I thoroughly discussed with the patient his procedure, including the risks involved. Patient understands what the procedure involves including the benefits and any risks. Patient understands alternatives to the proposed procedure. Risks including (but not limited to) bleeding, tearing of the lining (perforation), rupture of adjacent organs, problems with heart and lung function, infection, and medication reactions. A small percentage of complications may require surgery, hospitalization, repeat endoscopic procedure, and/or transfusion.  Patient understood and agreed.  Cassie Shedlock Castaneda, MD Gastroenterology and Hepatology [facility]  

## 2021-07-15 NOTE — Anesthesia Postprocedure Evaluation (Signed)
Anesthesia Post Note  Patient: Colleen Lee  Procedure(s) Performed: ESOPHAGOGASTRODUODENOSCOPY (EGD) WITH PROPOFOL BIOPSY  Patient location during evaluation: Phase II Anesthesia Type: General Level of consciousness: awake and alert and oriented Pain management: pain level controlled Vital Signs Assessment: post-procedure vital signs reviewed and stable Respiratory status: spontaneous breathing, nonlabored ventilation and respiratory function stable Cardiovascular status: blood pressure returned to baseline and stable Postop Assessment: no apparent nausea or vomiting Anesthetic complications: no   No notable events documented.   Last Vitals:  Vitals:   07/15/21 1115 07/15/21 1152  BP: 112/75 115/88  Pulse: 79 87  Resp: (!) 23 17  Temp:  36.6 C  SpO2: 99% 100%    Last Pain:  Vitals:   07/15/21 1152  TempSrc:   PainSc: 0-No pain                 Zaley Talley C Jonita Hirota

## 2021-07-15 NOTE — Patient Outreach (Signed)
[facility]) Care Management  07/15/2021  TIEA MANNINEN Aug 10, 1955 [id number]   Member scheduled for 4th outreach attempt today however noted she was admitted to hospital on 12/12.  Hospital liaisons notified, will follow up with member pending disposition.  Valente David, [place], MSN [place] [phone]

## 2021-07-15 NOTE — Evaluation (Signed)
Occupational Therapy Evaluation Patient Details Name: Colleen Lee MRN: [id number] DOB: [birth year] Today's Date: 07/15/2021   History of Present Illness Colleen Lee is a 65 y.o. female with medical history significant for alcoholic liver cirrhosis, asthma, [name].  Patient was brought to the ED with reports of generalized weakness and vomiting.  For about a week.  At the time of my evaluation patient appears lethargic, is able to answer questions, sister Misty Stanley at bedside and assist with the history.  Patient lives with her husband.    Sister Misty Stanley reports that since patient was diagnosed with a stroke about 2 months ago, patient has declined.  She reports patient had abnormal gait, prompting work-up for which she was diagnosed with a stroke.  She was initially able to walk, but over the past 2 weeks patient has not been able to walk.  And he noticed that her grip on the left upper arm was weaker than her right.  She has had poor oral intake.  [name] speech has also been sluggish, but they are able to understand what she says.  Patient tells me she has upper abdominal pain.   Clinical Impression   Pt agreeable to OT/PT co-evaluation. Pt seated at EOB with PT upon entrance into room. Pt demonstrated fair seated balance at EOB. Pt demonstrates 3+/5 MMT grossly for B UE with poor endurance and slow fine and gross motor coordination. Pt required mod to max A for bed mobility and reported feeling weak and was unable to doff or don socks seated at EOB. Pt was left in bed with call bell within reach. Pt will benefit from continued OT in the hospital and recommended venue below to increase strength, balance, and endurance for safe ADL's.        Recommendations for follow up therapy are one component of a multi-disciplinary discharge planning process, led by the attending physician.  Recommendations may be updated based on patient status, additional functional criteria and insurance authorization.    Follow Up Recommendations  Skilled nursing-short term rehab (<3 hours/day)    Assistance Recommended at Discharge Intermittent Supervision/Assistance  Functional Status Assessment  Patient has had a recent decline in their functional status and demonstrates the ability to make significant improvements in function in a reasonable and predictable amount of time.  Equipment Recommendations  None recommended by OT    Recommendations for Other Services       Precautions / Restrictions Precautions Precautions: Fall Restrictions Weight Bearing Restrictions: No      Mobility Bed Mobility Overal bed mobility: Needs Assistance Bed Mobility: Sit to Supine     Supine to sit: Max assist;Mod assist Sit to supine: Max assist;Mod assist   General bed mobility comments: slow labored movement; generally weak with poor endurance.    Transfers Overall transfer level: Needs assistance Equipment used: Rolling walker (2 wheels) Transfers: Sit to/from Stand Sit to Stand: Mod assist;Max assist           General transfer comment: PT reportedly sood with pt just prior to OT arriving with pt needing mod to max A with RW.      Balance Overall balance assessment: Needs assistance Sitting-balance support: Bilateral upper extremity supported;Feet supported Sitting balance-Leahy Scale: Fair Sitting balance - Comments: fair at EOB when this OT arrived to pt room.   Standing balance support: Bilateral upper extremity supported Standing balance-Leahy Scale: Zero Standing balance comment: with RW  ADL either performed or assessed with clinical judgement   ADL Overall ADL's : Needs assistance/impaired                     Lower Body Dressing: Maximal assistance;Sitting/lateral leans Lower Body Dressing Details (indicate cue type and reason): Pt was unable to doff or don socks reporting she felt too weak.               General ADL Comments:  Pt reportedly required mod to max A for sit to stand with pt quickly sitting back at EOB with PT just prior to OT arriving to pt room.     Vision Baseline Vision/History:  (Pt reported having previous cataracts procedures.) Ability to See in Adequate Light: 0 Adequate Patient Visual Report: No change from baseline Vision Assessment?: Yes Tracking/Visual Pursuits: Able to track stimulus in all quads without difficulty Convergence: Other (comment) (Pt demonstrated moderate difficulty to maintain focus for convergence.) Visual Fields: No apparent deficits                Pertinent Vitals/Pain Pain Assessment: No/denies pain     Hand Dominance Right   Extremity/Trunk Assessment Upper Extremity Assessment Upper Extremity Assessment: Generalized weakness (3+/5 grossly for MMT. Decreased speed and acuracy with coordination via finger to nose test and slow sequential finger touching for fine motor coordination.)   Lower Extremity Assessment Lower Extremity Assessment: Defer to PT evaluation   Cervical / Trunk Assessment Cervical / Trunk Assessment: Kyphotic   Communication Communication Communication: No difficulties   Cognition Arousal/Alertness: Awake/alert Behavior During Therapy: WFL for tasks assessed/performed Overall Cognitive Status: Within Functional Limits for tasks assessed                                                        Home Living Family/patient expects to be discharged to:: Private residence Living Arrangements: Spouse/significant other Available Help at Discharge: Family Type of Home: House Home Access: Stairs to enter Secretary/administrator of Steps: 4 Entrance Stairs-Rails: Right Home Layout: One level     Bathroom Shower/Tub: Chief Strategy Officer: Handicapped height Bathroom Accessibility: Yes   Home Equipment: Medical laboratory scientific officer - quad   Additional Comments: Patient stating no AD at home - questionable historian  (History obtained from PT)      Prior Functioning/Environment Prior Level of Function : Needs assist             Mobility Comments: Patient stating household ambulator without AD ADLs Comments: Pt repoted indpednence with ADL's with family assisting with IADL's.        OT Problem List: Decreased activity tolerance;Impaired balance (sitting and/or standing);Decreased coordination      OT Treatment/Interventions: Self-care/ADL training;Therapeutic exercise;Patient/family education;Balance training;Visual/perceptual remediation/compensation;Energy conservation;DME and/or AE instruction;Therapeutic activities    OT Goals(Current goals can be found in the care plan section) Acute Rehab OT Goals Patient Stated Goal: Pt reported feeling weak. Goal likely to increase strength. OT Goal Formulation: With patient Time For Goal Achievement: 07/29/21 Potential to Achieve Goals: Fair  OT Frequency: Min 2X/week               Co-evaluation PT/OT/SLP Co-Evaluation/Treatment: Yes Reason for Co-Treatment: To address functional/ADL transfers PT goals addressed during session: Mobility/safety with mobility;Balance;Proper use of DME;Strengthening/ROM OT goals addressed during session: ADL's and self-care  AM-PAC OT "6 Clicks" Daily Activity     Outcome Measure Help from another person eating meals?: None Help from another person taking care of personal grooming?: A Little Help from another person toileting, which includes using toliet, bedpan, or urinal?: A Lot Help from another person bathing (including washing, rinsing, drying)?: A Lot Help from another person to put on and taking off regular upper body clothing?: A Little Help from another person to put on and taking off regular lower body clothing?: A Lot 6 Click Score: 16   End of Session Equipment Utilized During Treatment: Rolling walker (2 wheels)  Activity Tolerance: Patient tolerated treatment well Patient left: in  bed;with call bell/phone within reach  OT Visit Diagnosis: Unsteadiness on feet (R26.81);Other abnormalities of gait and mobility (R26.89);Muscle weakness (generalized) (M62.81)                Time: 2956-2130 OT Time Calculation (min): 10 min Charges:  OT General Charges $OT Visit: 1 Visit OT Evaluation $OT Eval Moderate Complexity: 1 Mod  Fraya Ueda OT, MOT  Danie Chandler 07/15/2021, 12:06 PM

## 2021-07-15 NOTE — Transfer of Care (Signed)
Immediate Anesthesia Transfer of Care Note  Patient: Colleen Lee  Procedure(s) Performed: ESOPHAGOGASTRODUODENOSCOPY (EGD) WITH PROPOFOL BIOPSY  Patient Location: PACU  Anesthesia Type:General  Level of Consciousness: drowsy  Airway & Oxygen Therapy: Patient Spontanous Breathing and Patient connected to nasal cannula oxygen  Post-op Assessment: Report given to RN and Post -op Vital signs reviewed and stable  Post vital signs: Reviewed and stable  Last Vitals:  Vitals Value Taken Time  BP    Temp    Pulse 86 07/15/21 1111  Resp 27 07/15/21 1111  SpO2 100 % 07/15/21 1111  Vitals shown include unvalidated device data.  Last Pain:  Vitals:   07/15/21 1059  TempSrc:   PainSc: 0-No pain         Complications: No notable events documented.

## 2021-07-15 NOTE — Evaluation (Signed)
Physical Therapy Evaluation Patient Details Name: MYRTICE LOWDERMILK MRN: [id number] DOB: 1955/12/10 Today's Date: 07/15/2021  History of Present Illness  Colleen Lee is a 65 y.o. female with medical history significant for alcoholic liver cirrhosis, asthma, [name].  Patient was brought to the ED with reports of generalized weakness and vomiting.  For about a week.  At the time of my evaluation patient appears lethargic, is able to answer questions, sister Lattie Haw at bedside and assist with the history.  Patient lives with her husband.    Sister Lattie Haw reports that since patient was diagnosed with a stroke about 2 months ago, patient has declined.  She reports patient had abnormal gait, prompting work-up for which she was diagnosed with a stroke.  She was initially able to walk, but over the past 2 weeks patient has not been able to walk.  And he noticed that her grip on the left upper arm was weaker than her right.  She has had poor oral intake.  [name] speech has also been sluggish, but they are able to understand what she says.  Patient tells me she has upper abdominal pain.   Clinical Impression  Patient limited for functional mobility as stated below secondary to BLE weakness, fatigue and poor standing balance. Patient requiring mod/max assist with bed mobility and transfer to standing secondary to generalized weakness. Patient limited to 5 seconds of standing with RW before BLE fatigue requires return to seated. Patient assisted back to supine at end of session. Patient will benefit from continued physical therapy in hospital and recommended venue below to increase strength, balance, endurance for safe ADLs and gait.        Recommendations for follow up therapy are one component of a multi-disciplinary discharge planning process, led by the attending physician.  Recommendations may be updated based on patient status, additional functional criteria and insurance authorization.  Follow Up  Recommendations Skilled nursing-short term rehab (<3 hours/day)    Assistance Recommended at Discharge Frequent or constant Supervision/Assistance  Functional Status Assessment Patient has had a recent decline in their functional status and demonstrates the ability to make significant improvements in function in a reasonable and predictable amount of time.  Equipment Recommendations  None recommended by PT    Recommendations for Other Services       Precautions / Restrictions Precautions Precautions: Fall Restrictions Weight Bearing Restrictions: No      Mobility  Bed Mobility Overal bed mobility: Needs Assistance Bed Mobility: Supine to Sit;Sit to Supine     Supine to sit: Max assist;Mod assist Sit to supine: Max assist;Mod assist   General bed mobility comments: slow, labored, requires frequent verbal cueing with limited carry over for mobility, assist for LE movement and to upright trunk    Transfers Overall transfer level: Needs assistance Equipment used: Rolling walker (2 wheels) Transfers: Sit to/from Stand Sit to Stand: Mod assist;Max assist           General transfer comment: labored with RW, assist for weakness, limited to about 5 seconds of standing before legs fatigue    Ambulation/Gait                  Stairs            Wheelchair Mobility    Modified Rankin (Stroke Patients Only)       Balance Overall balance assessment: Needs assistance Sitting-balance support: Feet supported;Bilateral upper extremity supported Sitting balance-Leahy Scale: Poor Sitting balance - Comments: poor initially, fair after  several minutes of assist   Standing balance support: Bilateral upper extremity supported Standing balance-Leahy Scale: Zero Standing balance comment: with RW                             Pertinent Vitals/Pain Pain Assessment: No/denies pain    Home Living Family/patient expects to be discharged to:: Private  residence Living Arrangements: Spouse/significant other Available Help at Discharge: Family Type of Home: House Home Access: Stairs to enter Entrance Stairs-Rails: Right Entrance Stairs-Number of Steps: 4   Home Layout: One level   Additional Comments: Patient stating no AD at home - questionable historian    Prior Function Prior Level of Function : Needs assist             Mobility Comments: Patient stating household ambulator without AD ADLs Comments: family assists     Hand Dominance   Dominant Hand: Right    Extremity/Trunk Assessment   Upper Extremity Assessment Upper Extremity Assessment: Defer to OT evaluation    Lower Extremity Assessment Lower Extremity Assessment: Generalized weakness    Cervical / Trunk Assessment Cervical / Trunk Assessment: Kyphotic  Communication   Communication: No difficulties  Cognition Arousal/Alertness: Awake/alert Behavior During Therapy: WFL for tasks assessed/performed Overall Cognitive Status: Within Functional Limits for tasks assessed                                          General Comments      Exercises     Assessment/Plan    PT Assessment Patient needs continued PT services  PT Problem List Decreased strength;Decreased activity tolerance;Decreased balance;Decreased mobility       PT Treatment Interventions DME instruction;Balance training;Gait training;Neuromuscular re-education;Stair training;Functional mobility training;Patient/family education;Therapeutic activities;Wheelchair mobility training;Therapeutic exercise    PT Goals (Current goals can be found in the Care Plan section)  Acute Rehab PT Goals Patient Stated Goal: Return home PT Goal Formulation: With patient Time For Goal Achievement: 07/29/21 Potential to Achieve Goals: Fair    Frequency Min 3X/week   Barriers to discharge        Co-evaluation PT/OT/SLP Co-Evaluation/Treatment: Yes Reason for Co-Treatment: To  address functional/ADL transfers;Complexity of the patient's impairments (multi-system involvement) PT goals addressed during session: Mobility/safety with mobility;Balance;Proper use of DME;Strengthening/ROM         AM-PAC PT "6 Clicks" Mobility  Outcome Measure Help needed turning from your back to your side while in a flat bed without using bedrails?: A Little Help needed moving from lying on your back to sitting on the side of a flat bed without using bedrails?: A Lot Help needed moving to and from a bed to a chair (including a wheelchair)?: A Lot Help needed standing up from a chair using your arms (e.g., wheelchair or bedside chair)?: A Lot Help needed to walk in hospital room?: Total Help needed climbing 3-5 steps with a railing? : Total 6 Click Score: 11    End of Session Equipment Utilized During Treatment: Gait belt Activity Tolerance: Patient limited by fatigue Patient left: in bed;with call bell/phone within reach;with bed alarm set Nurse Communication: Mobility status PT Visit Diagnosis: Unsteadiness on feet (R26.81);Other abnormalities of gait and mobility (R26.89);Muscle weakness (generalized) (M62.81)    Time: 5093-2671 PT Time Calculation (min) (ACUTE ONLY): 21 min   Charges:   PT Evaluation $PT Eval Low Complexity: 1 Low  8:56 AM, 07/15/21 Mearl Latin PT, DPT Physical Therapist at [facility]

## 2021-07-15 NOTE — Progress Notes (Signed)
PROGRESS NOTE    Colleen Lee  HFW:[id number] DOB: April 09, 1956 DOA: 07/13/2021 PCP: Sandi Mariscal, MD   Brief Narrative: Colleen Lee is a 65 y.o. female with a history of alcoholic liver cirrhosis, asthma, [name], hypertension. Patient presented secondary to vomiting and abdominal pain. On admission she was also suffering from metabolic encephalopathy. CT abdomen/pelvis without acute process identified. GI consulted on admission. SIRS criteria met without source. Procalcitonin elevated. Ceftriaxone/Flagyl initiated. Blood cultures pending.   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 if tolerating oral intake well   SIRS (systemic inflammatory response syndrome) ([place]) No source identified. Imaging with no specific findings.  -UA not suggestive of UTI, blood cultures negative, -SIRS pathophysiology resolved - Acute metabolic encephalopathy Unknown etiology. Normal ammonia. -Suspect dehydration related, mentation appears to be improving   Weakness -PT/OT eval--appreciated recommends SNF rehab, patient declines, patient requesting home health services instead   Essential hypertension -Continue Labetalol 027 mg BD   Alcoholic cirrhosis of liver cirrhosis/hep C -LFTs noted   Code Status:   Code Status: Full Code Family Communication: sister at bedside DVT prophylaxis: Lovenox Code Status:   Code Status: Full Code Disposition Plan: Discharge home vs SNF likely if tolerating oral intake Consultants:  Gastroenterology  Procedures:  EGD  Antimicrobials: Ceftriaxone Flagyl    Subjective:  -Tolerated EGD well -will Try liquid diet -her brother and sister  is at bedside, questions  answered  Objective: Vitals:   07/15/21 1115 07/15/21 1152 07/15/21 1244 07/15/21 1830  BP: 112/75 115/88 124/85 102/83  Pulse: 79 87 84 91  Resp: (!) $RemoveB'23 17 16 20  'kmYhELwX$ Temp:  97.8 F (36.6 C) 97.6 F (36.4 C) 98.3 F (36.8 C)  TempSrc:   Oral Oral  SpO2: 99% 100% 100% 100%  Weight:      Height:        Intake/Output Summary (Last 24 hours) at 07/15/2021 1958 Last data filed at 07/15/2021 1500 Gross per 24 hour  Intake 723.39 ml  Output 1300 ml  Net -576.61 ml   Filed Weights   07/13/21 1800 07/15/21 0526 07/15/21 1021  Weight: 63.3 kg 63.7 kg 63.7 kg    Examination:  General exam: Appears calm and comfortable. Chronically ill appearing Respiratory system: Clear to auscultation. Respiratory effort normal. Cardiovascular system: S1 & S2 heard, RRR. No murmurs, rubs, gallops or clicks. Gastrointestinal system: Abdomen is distended especially in upper quadrants, soft and tender in right quadrants. Normal bowel sounds heard. Central nervous system: mentation improving, residual weakness appears to be baseline musculoskeletal: No edema. No calf tenderness Skin: No cyanosis. No rashes   Data Reviewed: I have personally reviewed following labs and imaging studies  CBC Lab Results  Component Value Date   WBC 7.1 07/15/2021   RBC 2.71 (L) 07/15/2021   HGB 9.1 (L) 07/15/2021   HCT 26.6 (L) 07/15/2021   MCV 98.2 07/15/2021   MCH 33.6 07/15/2021   PLT 199 07/15/2021   MCHC 34.2 07/15/2021   RDW 19.0 (H) 07/15/2021   LYMPHSABS 0.6 (L) 07/09/2021   MONOABS 0.4 07/09/2021   EOSABS 0.0 07/09/2021   BASOSABS 0.0 74/07/8785     Last metabolic panel Lab Results  Component Value Date   NA 132 (L) 07/15/2021   K 3.2 (L) 07/15/2021   CL  109 07/15/2021   CO2 16 (L) 07/15/2021   BUN 8 07/15/2021   CREATININE 0.66 07/15/2021   GLUCOSE 94 07/15/2021   GFRNONAA >60 07/15/2021   GFRAA 76 12/19/2019   CALCIUM 9.6 07/15/2021   PHOS 2.7 01/03/2021   PROT 5.4 (L) 07/15/2021    ALBUMIN 1.9 (L) 07/15/2021   BILITOT 0.6 07/15/2021   ALKPHOS 80 07/15/2021   AST 25 07/15/2021   ALT 16 07/15/2021   ANIONGAP 7 07/15/2021    CBG (last 3)  Recent Labs    07/14/21 1104 07/14/21 1613 07/15/21 0755  GLUCAP 191* 138* 99     GFR: Estimated Creatinine Clearance: 70.5 mL/min (by C-G formula based on SCr of 0.66 mg/dL).  Coagulation Profile: Recent Labs  Lab 07/14/21 1230 07/15/21 0738  INR 1.2 1.2    Recent Results (from the past 240 hour(s))  Resp Panel by RT-PCR (Flu A&B, Covid) Nasopharyngeal Swab     Status: None   Collection Time: 07/09/21  1:39 PM   Specimen: Nasopharyngeal Swab; Nasopharyngeal(NP) swabs in vial transport medium  Result Value Ref Range Status   SARS Coronavirus 2 by RT PCR NEGATIVE NEGATIVE Final    Comment: (NOTE) SARS-CoV-2 target nucleic acids are NOT DETECTED.  The SARS-CoV-2 RNA is generally detectable in upper respiratory specimens during the acute phase of infection. The lowest concentration of SARS-CoV-2 viral copies this assay can detect is 138 copies/mL. A negative result does not preclude SARS-Cov-2 infection and should not be used as the sole basis for treatment or other patient management decisions. A negative result may occur with  improper specimen collection/handling, submission of specimen other than nasopharyngeal swab, presence of viral mutation(s) within the areas targeted by this assay, and inadequate number of viral copies(<138 copies/mL). A negative result must be combined with clinical observations, patient history, and epidemiological information. The expected result is Negative.  Fact Sheet for Patients:  EntrepreneurPulse.com.au  Fact Sheet for Healthcare Providers:  IncredibleEmployment.be  This test is no t yet approved or cleared by the [place] FDA and  has been authorized for detection and/or diagnosis of SARS-CoV-2 by FDA under an Emergency Use  Authorization (EUA). This EUA will remain  in effect (meaning this test can be used) for the duration of the COVID-19 declaration under Section 564(b)(1) of the Act, 21 U.S.C.section 360bbb-3(b)(1), unless the authorization is terminated  or revoked sooner.       Influenza A by PCR NEGATIVE NEGATIVE Final   Influenza B by PCR NEGATIVE NEGATIVE Final    Comment: (NOTE) The Xpert Xpress SARS-CoV-2/FLU/RSV plus assay is intended as an aid in the diagnosis of influenza from Nasopharyngeal swab specimens and should not be used as a sole basis for treatment. Nasal washings and aspirates are unacceptable for Xpert Xpress SARS-CoV-2/FLU/RSV testing.  Fact Sheet for Patients: EntrepreneurPulse.com.au  Fact Sheet for Healthcare Providers: IncredibleEmployment.be  This test is not yet approved or cleared by the [place] FDA and has been authorized for detection and/or diagnosis of SARS-CoV-2 by FDA under an Emergency Use Authorization (EUA). This EUA will remain in effect (meaning this test can be used) for the duration of the COVID-19 declaration under Section 564(b)(1) of the Act, 21 U.S.C. section 360bbb-3(b)(1), unless the authorization is terminated or revoked.  Performed at [facility], [address], [place], [place] [address]   Resp Panel by RT-PCR (Flu A&B, Covid) Nasopharyngeal Swab     Status: None   Collection Time: 07/13/21  3:30 PM   Specimen: Nasopharyngeal  Swab; Nasopharyngeal(NP) swabs in vial transport medium  Result Value Ref Range Status   SARS Coronavirus 2 by RT PCR NEGATIVE NEGATIVE Final    Comment: (NOTE) SARS-CoV-2 target nucleic acids are NOT DETECTED.  The SARS-CoV-2 RNA is generally detectable in upper respiratory specimens during the acute phase of infection. The lowest concentration of SARS-CoV-2 viral copies this assay can detect is 138 copies/mL. A negative result does not preclude SARS-Cov-2 infection and  should not be used as the sole basis for treatment or other patient management decisions. A negative result may occur with  improper specimen collection/handling, submission of specimen other than nasopharyngeal swab, presence of viral mutation(s) within the areas targeted by this assay, and inadequate number of viral copies(<138 copies/mL). A negative result must be combined with clinical observations, patient history, and epidemiological information. The expected result is Negative.  Fact Sheet for Patients:  EntrepreneurPulse.com.au  Fact Sheet for Healthcare Providers:  IncredibleEmployment.be  This test is no t yet approved or cleared by the [place] FDA and  has been authorized for detection and/or diagnosis of SARS-CoV-2 by FDA under an Emergency Use Authorization (EUA). This EUA will remain  in effect (meaning this test can be used) for the duration of the COVID-19 declaration under Section 564(b)(1) of the Act, 21 U.S.C.section 360bbb-3(b)(1), unless the authorization is terminated  or revoked sooner.       Influenza A by PCR NEGATIVE NEGATIVE Final   Influenza B by PCR NEGATIVE NEGATIVE Final    Comment: (NOTE) The Xpert Xpress SARS-CoV-2/FLU/RSV plus assay is intended as an aid in the diagnosis of influenza from Nasopharyngeal swab specimens and should not be used as a sole basis for treatment. Nasal washings and aspirates are unacceptable for Xpert Xpress SARS-CoV-2/FLU/RSV testing.  Fact Sheet for Patients: EntrepreneurPulse.com.au  Fact Sheet for Healthcare Providers: IncredibleEmployment.be  This test is not yet approved or cleared by the [place] FDA and has been authorized for detection and/or diagnosis of SARS-CoV-2 by FDA under an Emergency Use Authorization (EUA). This EUA will remain in effect (meaning this test can be used) for the duration of the COVID-19 declaration  under Section 564(b)(1) of the Act, 21 U.S.C. section 360bbb-3(b)(1), unless the authorization is terminated or revoked.  Performed at [facility], [address], [place], [place] [address]   Culture, blood (routine x 2)     Status: None (Preliminary result)   Collection Time: 07/13/21 10:53 PM   Specimen: BLOOD LEFT WRIST  Result Value Ref Range Status   Specimen Description BLOOD LEFT WRIST  Final   Special Requests   Final    BOTTLES DRAWN AEROBIC AND ANAEROBIC Blood Culture results may not be optimal due to an inadequate volume of blood received in culture bottles   Culture   Final    NO GROWTH < 12 HOURS Performed at [facility], [address] [place], [place] [address]    Report Status PENDING  Incomplete  Culture, blood (routine x 2)     Status: None (Preliminary result)   Collection Time: 07/14/21 10:00 AM   Specimen: BLOOD LEFT FOREARM  Result Value Ref Range Status   Specimen Description BLOOD LEFT FOREARM  Final   Special Requests   Final    BOTTLES DRAWN AEROBIC AND ANAEROBIC Blood Culture adequate volume Performed at [facility], [address] [place], [place] [address]    Culture PENDING  Incomplete   Report Status PENDING  Incomplete  Culture, blood (routine x 2)  Status: None (Preliminary result)   Collection Time: 07/14/21 10:10 AM   Specimen: BLOOD RIGHT HAND  Result Value Ref Range Status   Specimen Description BLOOD RIGHT HAND  Final   Special Requests   Final    BOTTLES DRAWN AEROBIC ONLY Blood Culture results may not be optimal due to an inadequate volume of blood received in culture bottles Performed at [facility], [address], [place], [place] [address]    Culture PENDING  Incomplete   Report Status PENDING  Incomplete  Culture, blood (routine x 2)     Status: None (Preliminary result)   Collection Time: 07/14/21 10:15 AM   Specimen: BLOOD LEFT HAND  Result Value Ref Range Status   Specimen Description BLOOD LEFT HAND  Final   Special  Requests   Final    BOTTLES DRAWN AEROBIC AND ANAEROBIC Blood Culture adequate volume Performed at [facility], [address] [place], [place] [address]    Culture PENDING  Incomplete   Report Status PENDING  Incomplete      Radiology Studies: No results found.  Scheduled Meds:  allopurinol  300 mg Oral Daily   enoxaparin (LOVENOX) injection  40 mg Subcutaneous Q24H   feeding supplement  1 Container Oral TID BM   folic acid  1 mg Oral Daily   hydrALAZINE  50 mg Oral Q8H   labetalol  100 mg Oral BID   megestrol  40 mg Oral Daily   multivitamin with minerals  1 tablet Oral Daily   pantoprazole (PROTONIX) IV  40 mg Intravenous Q24H   Ensure Max Protein  11 oz Oral Daily   thiamine injection  100 mg Intravenous Daily   Continuous Infusions:  sodium chloride 20 mL/hr at 07/15/21 1545   cefTRIAXone (ROCEPHIN)  IV 2 g (07/15/21 1322)   metronidazole 500 mg (07/15/21 1547)   sodium chloride       LOS: 0 days    [facility] 07/15/2021, 7:58 PM  If 7PM-7AM, please contact night-coverage [email or web address]

## 2021-07-15 NOTE — Plan of Care (Signed)
°  Problem: Acute Rehab PT Goals(only PT should resolve) Goal: Pt Will Go Supine/Side To Sit Outcome: Progressing Flowsheets (Taken 07/15/2021 0857) Pt will go Supine/Side to Sit:  with minimal assist  with moderate assist Goal: Pt Will Go Sit To Supine/Side Outcome: Progressing Flowsheets (Taken 07/15/2021 0857) Pt will go Sit to Supine/Side:  with minimal assist  with moderate assist Goal: Patient Will Transfer Sit To/From Stand Outcome: Progressing Flowsheets (Taken 07/15/2021 0857) Patient will transfer sit to/from stand: with moderate assist Goal: Pt Will Transfer Bed To Chair/Chair To Bed Outcome: Progressing Flowsheets (Taken 07/15/2021 0857) Pt will Transfer Bed to Chair/Chair to Bed: with mod assist Goal: Pt/caregiver will Perform Home Exercise Program Outcome: Progressing Flowsheets (Taken 07/15/2021 0857) Pt/caregiver will Perform Home Exercise Program:  For increased strengthening  For improved balance  With Supervision, verbal cues required/provided  8:58 AM, 07/15/21 Mearl Latin PT, DPT Physical Therapist at [facility]

## 2021-07-15 NOTE — Plan of Care (Signed)
°  Problem: Acute Rehab OT Goals (only OT should resolve) Goal: Pt. Will Perform Grooming Flowsheets (Taken 07/15/2021 1208) Pt Will Perform Grooming:  with min assist  standing  with adaptive equipment Goal: Pt. Will Perform Upper Body Bathing Flowsheets (Taken 07/15/2021 1208) Pt Will Perform Upper Body Bathing:  with modified independence  sitting Goal: Pt. Will Perform Lower Body Bathing Flowsheets (Taken 07/15/2021 1208) Pt Will Perform Lower Body Bathing:  with min guard assist  sitting/lateral leans  with adaptive equipment Goal: Pt. Will Perform Upper Body Dressing Flowsheets (Taken 07/15/2021 1208) Pt Will Perform Upper Body Dressing:  with modified independence  sitting Goal: Pt. Will Perform Lower Body Dressing Flowsheets (Taken 07/15/2021 1208) Pt Will Perform Lower Body Dressing:  with min assist  sitting/lateral leans  with adaptive equipment Goal: Pt. Will Transfer To Toilet Flowsheets (Taken 07/15/2021 1208) Pt Will Transfer to Toilet:  with min assist  stand pivot transfer  bedside commode Goal: Pt/Caregiver Will Perform Home Exercise Program Flowsheets (Taken 07/15/2021 1208) Pt/caregiver will Perform Home Exercise Program:  Increased strength  Both right and left upper extremity  With minimal assist  With Supervision  Ayame Rena OT, MOT

## 2021-07-15 NOTE — TOC Initial Note (Addendum)
Transition of Care [facility]) - Initial/Assessment Note    Patient Details  Name: Colleen Lee MRN: [id number] Date of Birth: 23-Aug-1955  Transition of Care [facility]) CM/SW Contact:    Boneta Lucks, RN Phone Number: 07/15/2021, 1:13 PM  Clinical Narrative:    Patient admitted with intractable vomiting. Patient lives at home with her spouse. She has a walker. Her sister Tim Lair assist them. She has applied for CAPP aide and is working on [employer] paperwork. Family wants to care for patient at home and is agreeable to [facility]. No preferences, Calvin with [facility] accepted the referral.     Handout given for [employer], Tim Lair was asking to purchase for patient.              Expected Discharge Plan: [place] Barriers to Discharge: Continued Medical Work up  Patient Goals and CMS Choice Patient states their goals for this hospitalization and ongoing recovery are:: to return home. CMS Medicare.gov Compare Post Acute Care list provided to:: Patient Represenative (must comment) Choice offered to / list presented to : Sibling  Expected Discharge Plan and Services Expected Discharge Plan: [place] Choice: [place] arrangements for the past 2 months: [place]: PT HH Agency: [facility] ([place]) Date HH Agency Contacted: 07/15/21 Time HH Agency Contacted: 68 Representative spoke with at [place]: [place] Arrangements/Services Living arrangements for the past 2 months: [place] Lives with:: Spouse Patient language and need for interpreter reviewed:: Yes Do you feel safe going back to the place where you live?: Yes      Need for Family Participation in Patient Care: Yes (Comment) Care giver support system in place?: Yes (comment) Current home services: DME Criminal Activity/Legal Involvement Pertinent to Current Situation/Hospitalization: No - Comment as  needed  Activities of Daily Living Home Assistive Devices/Equipment: Walker (specify type) (4 wheel walker) ADL Screening (condition at time of admission) [name] cognitive ability adequate to safely complete daily activities?: No Is the patient deaf or have difficulty hearing?: No Does the patient have difficulty seeing, even when wearing glasses/contacts?: No Does the patient have difficulty concentrating, remembering, or making decisions?: No Patient able to express need for assistance with ADLs?: Yes Does the patient have difficulty dressing or bathing?: Yes Independently performs ADLs?: No Communication: Independent Dressing (OT): Needs assistance Is this a change from baseline?: Pre-admission baseline Grooming: Needs assistance Is this a change from baseline?: Pre-admission baseline Feeding: Needs assistance Is this a change from baseline?: Pre-admission baseline Bathing: Needs assistance Is this a change from baseline?: Pre-admission baseline Toileting: Needs assistance Is this a change from baseline?: Pre-admission baseline In/Out Bed: Needs assistance Is this a change from baseline?: Pre-admission baseline Walks in Home: Needs assistance Is this a change from baseline?: Pre-admission baseline Does the patient have difficulty walking or climbing stairs?: Yes Weakness of Legs: Both Weakness of Arms/Hands: Both  Permission Sought/Granted     Share Information with NAME: Tim Lair     Permission granted to share info w Relationship: sister    Emotional Assessment      Alcohol / Substance Use: Not Applicable Psych Involvement: No (comment)  Admission diagnosis:  Malaise [R53.81] Weakness [R53.1] Intractable vomiting [R11.10] Nausea and vomiting, unspecified vomiting type [R11.2] Patient Active Problem List   Diagnosis Date Noted   SIRS (systemic inflammatory response syndrome) ([place]) 07/14/2021   RUQ abdominal pain  Anemia    Intractable vomiting 96/11/5407    Acute metabolic encephalopathy 81/19/1478   Weakness    Malaise    Pancreatitis, recurrent 05/17/2021   Dysuria 09/16/2020   Body mass index (BMI) 31.0-31.9, adult 01/07/2020   Osteoarthritis of spine with radiculopathy, 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   Acute pancreatitis 10/17/2019   ETOH abuse 10/17/2019   S/P right rotator cuff repair 01/30/19 02/06/2019   Nontraumatic complete tear of right rotator cuff    Arthritis of right acromioclavicular joint    S/P total knee replacement, 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/24/2018   Shoulder impingement, right 05/24/2018   Dehydration    Intractable cyclical vomiting 29/56/2130   Pyloric stenosis in adult    Thrombocytopenia ([place]) 12/20/2017   Leukocytosis    Malnutrition of moderate degree 12/17/2017   Acute renal failure (ARF) ([place]) 12/15/2017   Chronic abdominal pain 12/15/2017   Gastroenteritis 06/14/2016   Nausea with vomiting 06/10/2016   Uterine enlargement 06/09/2016   Intractable nausea and vomiting 06/08/2016   Acute infective gastroenteritis 06/08/2016   Diarrhea 06/08/2016   Essential hypertension 06/08/2016   GERD (gastroesophageal reflux disease) 06/08/2016   Abdominal pain    Endometrial polyp 04/15/2016   PMB (postmenopausal bleeding) 86/57/8469   Alcoholic cirrhosis of liver without ascites ([place])    Asthma 01/21/2016   Hepatic cirrhosis ([place]) 09/22/2015   Arthritis of knee, degenerative 62/95/2841   History of Helicobacter pylori infection 10/30/2014   Lumbago with sciatica 07/01/2014   Chronic [name] with cirrhosis ([place]) 04/11/2014   De Quervain's disease (radial styloid tenosynovitis) 12/25/2013   Anorexia 11/21/2012   FH: colon cancer 11/21/2012   Early satiety 10/25/2012   Bowel habit changes 10/25/2012   Abdominal pain, epigastric 10/25/2012   Abdominal bloating 10/25/2012   Constipation 10/25/2012   Abnormal weight loss 10/25/2012   Chronic viral [name] ([place]) 10/25/2012   Radicular pain of left lower extremity  09/28/2012   Back pain 09/28/2012   Sciatica 08/10/2011   S/P arthroscopy of left knee 08/10/2011   Tibial plateau fracture 08/10/2011   Pain in joint, lower leg 02/12/2011   Stiffness of joint, not elsewhere classified, lower leg 02/12/2011   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

## 2021-07-15 NOTE — Progress Notes (Signed)
Initial Nutrition Assessment  DOCUMENTATION CODES:      INTERVENTION:  Boost Breeze po q morning (provides 250 kcal and 9 grams of protein)  Ensure Max  BID daily (150 kcal, 30 gr protein)   NUTRITION DIAGNOSIS:   Inadequate oral intake related to vomiting, nausea as evidenced by (S) per patient/family report, percent weight loss.   GOAL:  Patient will meet greater than or equal to 90% of their needs   MONITOR:  PO intake, Diet advancement, Supplement acceptance, Weight trends, Labs  REASON FOR ASSESSMENT:   Malnutrition Screening Tool    ASSESSMENT: Patient is a 65 yo female with hx of GERD, HTN, Fibromyalgia, ETOH Cirrhosis, N/V (chronic), anemia. Presents 12/12 with intractable vomiting. Weight loss.  EGD 12/14 -small hiatal hernia, diffuse portal hypertensive gastropathy entire examined stomach per MD.   Patient diet advanced to full liquids. Patient has just returned from procedure and is sleeping. Sister is bedside an assisted with history and has been helping with her care at home. Patient has not been able to keep food down the past week. History of chronic problem with N/V. She is taking an appetite medication.   Review of weights:  07/15/21- 63.7 kg 06/08/21- 71.7 kg 05/02/21 - 76.7 kg 09/16/20 - 84.2 kg   Patient has significant wt loss 11% x 1 month, 24% x 10 months. Unable to complete NFPE at this time. Expect based on diet hx recall and unplanned weight loss pt is severely malnourished.  Meds: allopurinol, folic acid, Megace, Protonix, Thiamine. There are no scheduled anti-nasuea meds.   Labs: BMP Latest Ref Rng & Units 07/15/2021 07/14/2021 07/13/2021  Glucose 70 - 99 mg/dL 94 126(H) 81  BUN 8 - 23 mg/dL 8 10 14   Creatinine 0.44 - 1.00 mg/dL 0.66 0.77 1.00  BUN/Creat Ratio 6 - 22 (calc) - - -  Sodium 135 - 145 mmol/L 132(L) 132(L) 133(L)  Potassium 3.5 - 5.1 mmol/L 3.2(L) 3.3(L) 4.0  Chloride 98 - 111 mmol/L 109 108 105  CO2 22 - 32 mmol/L 16(L) 16(L)  18(L)  Calcium 8.9 - 10.3 mg/dL 9.6 9.9 11.0(H)      NUTRITION - FOCUSED PHYSICAL EXAM: Unable to complete Nutrition-Focused physical exam at this time.     Diet Order:   Diet Order             Diet full liquid Room service appropriate? Yes; Fluid consistency: Thin  Diet effective now                   EDUCATION NEEDS:  Not appropriate for education at this time  Skin:  Skin Assessment: Reviewed RN Assessment  Last BM:  12/12  Height:   Ht Readings from Last 1 Encounters:  07/15/21 5\' 8"  (1.727 m)    Weight:   Wt Readings from Last 1 Encounters:  07/15/21 63.7 kg    Ideal Body Weight:   64 kg  BMI:  Body mass index is 21.35 kg/m.  Estimated Nutritional Needs:   Kcal:  1600-1800  Protein:  77-83 gr  Fluid:  1600 ml daily   Colman Cater MS,RD,CSG,LDN Contact: Shea Evans

## 2021-07-16 ENCOUNTER — Encounter (HOSPITAL_COMMUNITY): Payer: Self-pay | Admitting: Gastroenterology

## 2021-07-16 ENCOUNTER — Telehealth: Payer: Self-pay | Admitting: Internal Medicine

## 2021-07-16 LAB — CBC
HCT: 26.2 % — ABNORMAL LOW (ref 36.0–46.0)
Hemoglobin: 8.8 g/dL — ABNORMAL LOW (ref 12.0–15.0)
MCH: 32.6 pg (ref 26.0–34.0)
MCHC: 33.6 g/dL (ref 30.0–36.0)
MCV: 97 fL (ref 80.0–100.0)
Platelets: 191 10*3/uL (ref 150–400)
RBC: 2.7 MIL/uL — ABNORMAL LOW (ref 3.87–5.11)
RDW: 19.1 % — ABNORMAL HIGH (ref 11.5–15.5)
WBC: 4.9 10*3/uL (ref 4.0–10.5)
nRBC: 0 % (ref 0.0–0.2)

## 2021-07-16 LAB — PROTIME-INR
INR: 1.2 (ref 0.8–1.2)
Prothrombin Time: 14.7 seconds (ref 11.4–15.2)

## 2021-07-16 LAB — COMPREHENSIVE METABOLIC PANEL
ALT: 18 U/L (ref 0–44)
AST: 27 U/L (ref 15–41)
Albumin: 1.9 g/dL — ABNORMAL LOW (ref 3.5–5.0)
Alkaline Phosphatase: 81 U/L (ref 38–126)
Anion gap: 8 (ref 5–15)
BUN: 7 mg/dL — ABNORMAL LOW (ref 8–23)
CO2: 16 mmol/L — ABNORMAL LOW (ref 22–32)
Calcium: 9.5 mg/dL (ref 8.9–10.3)
Chloride: 108 mmol/L (ref 98–111)
Creatinine, Ser: 0.64 mg/dL (ref 0.44–1.00)
GFR, Estimated: >60 mL/min (ref >60)
Glucose, Bld: 102 mg/dL — ABNORMAL HIGH (ref 70–99)
Potassium: 3.6 mmol/L (ref 3.5–5.1)
Sodium: 132 mmol/L — ABNORMAL LOW (ref 135–145)
Total Bilirubin: 0.3 mg/dL (ref 0.3–1.2)
Total Protein: 5.2 g/dL — ABNORMAL LOW (ref 6.5–8.1)

## 2021-07-16 LAB — VITAMIN B1: Vitamin B1 (Thiamine): 30.5 nmol/L — ABNORMAL LOW (ref 66.5–200.0)

## 2021-07-16 LAB — GLUCOSE, CAPILLARY: Glucose-Capillary: 100 mg/dL — ABNORMAL HIGH (ref 70–99)

## 2021-07-16 LAB — SURGICAL PATHOLOGY

## 2021-07-16 MED ORDER — LACTULOSE 10 GM/15ML PO SOLN: 20.0000 g | Freq: Two times a day (BID) | ORAL | 1 refills | Status: AC | PRN

## 2021-07-16 MED ORDER — NYSTATIN 100000 UNIT/ML MT SUSP
5.0000 mL | Freq: Four times a day (QID) | OROMUCOSAL | Status: DC
  Administered 2021-07-16 (×2): 500000 [IU] via ORAL
  Filled 2021-07-16 (×2): qty 5

## 2021-07-16 MED ORDER — HYDROXYZINE HCL 10 MG PO TABS: 10.0000 mg | ORAL_TABLET | Freq: Every day | ORAL | 0 refills | Status: AC | PRN

## 2021-07-16 MED ORDER — ONDANSETRON HCL 4 MG PO TABS: 4.0000 mg | ORAL_TABLET | Freq: Four times a day (QID) | ORAL | 0 refills | Status: AC | PRN

## 2021-07-16 MED ORDER — FLUCONAZOLE 200 MG PO TABS: 200.0000 mg | ORAL_TABLET | Freq: Every day | ORAL | 0 refills | Status: AC

## 2021-07-16 MED ORDER — SUCRALFATE 1 G PO TABS: 1.0000 g | ORAL_TABLET | Freq: Three times a day (TID) | ORAL | 0 refills | Status: DC

## 2021-07-16 MED ORDER — PANTOPRAZOLE SODIUM 40 MG PO TBEC
40.0000 mg | DELAYED_RELEASE_TABLET | Freq: Two times a day (BID) | ORAL | 2 refills | Status: AC
Start: 2021-07-16 — End: 2022-07-16

## 2021-07-16 MED ORDER — MEGESTROL ACETATE 40 MG PO TABS: 40.0000 mg | ORAL_TABLET | Freq: Every day | ORAL | 1 refills | Status: AC

## 2021-07-16 MED ORDER — FLUCONAZOLE IN SODIUM CHLORIDE 200-0.9 MG/100ML-% IV SOLN
200.0000 mg | INTRAVENOUS | Status: DC
  Administered 2021-07-16: 200 mg via INTRAVENOUS
  Filled 2021-07-16: qty 100

## 2021-07-16 MED ORDER — ALBUTEROL SULFATE HFA 108 (90 BASE) MCG/ACT IN AERS: 2.0000 | INHALATION_SPRAY | Freq: Four times a day (QID) | RESPIRATORY_TRACT | 2 refills | Status: AC | PRN

## 2021-07-16 MED ORDER — ATENOLOL 25 MG PO TABS: 25.0000 mg | ORAL_TABLET | Freq: Two times a day (BID) | ORAL | 3 refills | Status: DC

## 2021-07-16 NOTE — Plan of Care (Signed)
  Problem: Education: Goal: Knowledge of General Education information will improve Description: Including pain rating scale, medication(s)/side effects and non-pharmacologic comfort measures Outcome: Progressing   Problem: Clinical Measurements: Goal: Ability to maintain clinical measurements within normal limits will improve Outcome: Progressing   

## 2021-07-16 NOTE — TOC Transition Note (Signed)
Transition of Care [facility]) - CM/SW Discharge Note   Patient Details  Name: Colleen Lee MRN: [id number] Date of Birth: 03-12-1956  Transition of Care [facility]) CM/SW Contact:  Boneta Lucks, RN Phone Number: 07/16/2021, 10:53 AM   Clinical Narrative:   Patient discharging [facility] today with [facility] RN/PT. MD asked to placed orders.    Final next level of care: [place] Barriers to Discharge: Barriers Resolved  Patient Goals and CMS Choice Patient states their goals for this hospitalization and ongoing recovery are:: to return home. CMS Medicare.gov Compare Post Acute Care list provided to:: Patient Represenative (must comment) Choice offered to / list presented to : Sibling  Discharge Placement    Patient and family notified of of transfer: 07/16/21  Discharge Plan and Services    Post Acute Care Choice: Home Health             HH Arranged: PT [place]: [place] ([place]) Date [facility] Agency Contacted: 07/15/21 Time [place]: 1312 Representative spoke with at [place]: [place]  Readmission Risk Interventions Readmission Risk Prevention Plan 07/16/2021 05/19/2021  Transportation Screening Complete Complete  HRI or [place] - Complete  Social Work Consult for [place] Planning/Counseling - Complete  Palliative Care Screening - Not Applicable  Medication Review Press photographer) Complete Complete  HRI or Home Care Consult Complete -  SW Recovery Care/Counseling Consult Complete -  Palliative Care Screening Not Applicable -  Skilled Nursing Facility Complete -  Some recent data might be hidden

## 2021-07-16 NOTE — Progress Notes (Signed)
Path positive for Candida esophagitis, with ulceration Will need to be discharged on fluconazole for total of 3 weeks

## 2021-07-16 NOTE — Progress Notes (Signed)
Patient made aware of biopsy results, + for candida esophagitis and need to complete 3 weeks of fluconazole.  Laureen Ochs. Bernarda Caffey [facility] [phone] 12/15/20222:09 PM

## 2021-07-16 NOTE — Progress Notes (Signed)
Occupational Therapy Treatment Patient Details Name: Colleen Lee MRN: [id number] DOB: [birth year] Today's Date: 07/16/2021   History of present illness Colleen Lee is a 65 y.o. female with medical history significant for alcoholic liver cirrhosis, asthma, [name].  Patient was brought to the ED with reports of generalized weakness and vomiting.  For about a week.  At the time of my evaluation patient appears lethargic, is able to answer questions, sister Misty Stanley at bedside and assist with the history.  Patient lives with her husband.    Sister Misty Stanley reports that since patient was diagnosed with a stroke about 2 months ago, patient has declined.  She reports patient had abnormal gait, prompting work-up for which she was diagnosed with a stroke.  She was initially able to walk, but over the past 2 weeks patient has not been able to walk.  And he noticed that her grip on the left upper arm was weaker than her right.  She has had poor oral intake.  [name] speech has also been sluggish, but they are able to understand what she says.  Patient tells me she has upper abdominal pain.   OT comments  Pt agreeable to OT treatment this date. Pt demonstrates need for Min A for initial attempts at B UE A/ROM progressing to more A/AROM for exercises except for horizontal abduction and protraction which pt completed without physical assist. Pt noted to fatigue quickly needing rest breaks after each set of 5. Pt stood twice this date with RW needing Mod A. Pt able to take small lateral steps towards head of bed with min to mod A using RW. Pt will benefit from continued OT in the hospital and recommended venue below to increase strength, balance, and endurance for safe ADL's.      Recommendations for follow up therapy are one component of a multi-disciplinary discharge planning process, led by the attending physician.  Recommendations may be updated based on patient status, additional functional criteria and  insurance authorization.    Follow Up Recommendations  Skilled nursing-short term rehab (<3 hours/day)    Assistance Recommended at Discharge Intermittent Supervision/Assistance  Equipment Recommendations  None recommended by OT    Recommendations for Other Services      Precautions / Restrictions Precautions Precautions: Fall Restrictions Weight Bearing Restrictions: No       Mobility Bed Mobility Overal bed mobility: Needs Assistance Bed Mobility: Sit to Supine;Supine to Sit     Supine to sit: Mod assist Sit to supine: Mod assist   General bed mobility comments: slow labored movement;    Transfers Overall transfer level: Needs assistance Equipment used: Rolling walker (2 wheels) Transfers: Sit to/from Stand Sit to Stand: Mod assist           General transfer comment: Pt also able to take lateral steps to head of bed with min to mod A using RW. Slow labored movement but pt able to stand for ~20 seconds on first attempt. x2 attempts total.     Balance Overall balance assessment: Needs assistance Sitting-balance support: Feet supported;No upper extremity supported Sitting balance-Leahy Scale: Fair Sitting balance - Comments: fair to good seated at EOB   Standing balance support: Bilateral upper extremity supported;During functional activity Standing balance-Leahy Scale: Poor Standing balance comment: with RW                           ADL either performed or assessed with clinical judgement   ADL  Overall ADL's : Needs assistance/impaired                                     Functional mobility during ADLs: Minimal assistance;Moderate assistance;Rolling walker (2 wheels) (Pt able to take small lateral steps to R side today using RW.)        Cognition Arousal/Alertness: Awake/alert Behavior During Therapy: WFL for tasks assessed/performed Overall Cognitive Status: Within Functional Limits for tasks assessed                                             Exercises Exercises: General Upper Extremity General Exercises - Upper Extremity Shoulder Flexion: AAROM;5 reps;Seated;Both Shoulder ABduction: AAROM;5 reps;Seated;Both Shoulder Horizontal ABduction: 5 reps;AROM;Seated;Both (x5 protractions as well)   Shoulder Instructions       General Comments      Pertinent Vitals/ Pain       Pain Assessment: 0-10 Pain Score: 10-Worst pain ever Pain Location: stomach Pain Descriptors / Indicators: Aching Pain Intervention(s): Limited activity within patient's tolerance;Monitored during session;Repositioned                                                          Frequency  Min 2X/week        Progress Toward Goals  OT Goals(current goals can now be found in the care plan section)  Progress towards OT goals: Progressing toward goals  ADL Goals Pt Will Perform Grooming: with min assist;standing;with adaptive equipment Pt Will Perform Upper Body Bathing: with modified independence;sitting Pt Will Perform Lower Body Bathing: with min guard assist;sitting/lateral leans;with adaptive equipment Pt Will Perform Upper Body Dressing: with modified independence;sitting Pt Will Perform Lower Body Dressing: with min assist;sitting/lateral leans;with adaptive equipment Pt Will Transfer to Toilet: with min assist;stand pivot transfer;bedside commode Pt/caregiver will Perform Home Exercise Program: Increased strength;Both right and left upper extremity;With minimal assist;With Supervision  Plan Discharge plan remains appropriate                                    End of Session Equipment Utilized During Treatment: Rolling walker (2 wheels)  OT Visit Diagnosis: Unsteadiness on feet (R26.81);Other abnormalities of gait and mobility (R26.89);Muscle weakness (generalized) (M62.81)   Activity Tolerance Patient tolerated treatment well   Patient Left in bed;with call  bell/phone within reach   Nurse Communication          Time: 1610-9604 OT Time Calculation (min): 25 min  Charges: OT General Charges $OT Visit: 1 Visit OT Treatments $Therapeutic Exercise: 8-22 mins  Tonyetta Berko OT, MOT  Danie Chandler 07/16/2021, 10:19 AM

## 2021-07-16 NOTE — Telephone Encounter (Signed)
Patient needs outpatient follow-up in our clinic.  Dr. Gala Romney patient though I think she primarily sees Vicente Males.  Please schedule with Vicente Males or one of the other apps in 6-8 weeks.  Thank you

## 2021-07-16 NOTE — Progress Notes (Signed)
Patient seen this a.m., sister bedside.  EGD showed small hiatal hernia, possible candidal esophagitis with biopsies taken.  Otherwise WNL.  Okay to switch to oral PPI twice daily.  Recommend advancing diet as tolerated.  Counseled patient and her sister on the vast importance of adequate nutrition going forward including protein supplementation.  Consider speech eval/modified barium pending clinical course.  We will follow-up on her biopsies.  We will arrange outpatient follow-up with GI.  GI to sign off, please call with any questions concerns.

## 2021-07-16 NOTE — Evaluation (Signed)
Clinical/Bedside Swallow Evaluation Patient Details  Name: Colleen Lee MRN: [id number] Date of Birth: August 04, 1955  Today's Date: 07/16/2021 Time: SLP Start Time (ACUTE ONLY): 1430 SLP Stop Time (ACUTE ONLY): 1453 SLP Time Calculation (min) (ACUTE ONLY): 23 min  Past Medical History:  Past Medical History:  Diagnosis Date   Anemia    Asthma    Chronic abdominal pain    Chronic back pain    Chronic constipation    Cirrhosis (HCC)    Metavir score F4 on elastography 2015   Cyclical vomiting    Fibroids    Fibromyalgia    GERD (gastroesophageal reflux disease)    H. pylori infection 2014   treated with pylera, had to be treated again as it was not eradicated. Urea breath test then negative after subsequent treatment.    [name]    HCV RNA positive 09/2012   Hypertension    Marijuana use    Nausea and vomiting    chronic, recurrent   PONV (postoperative nausea and vomiting)    pt thinks maybe once she had N&V   Sciatica of left side    Past Surgical History:  Past Surgical History:  Procedure Laterality Date   BALLOON DILATION  12/20/2017   Procedure: BALLOON DILATION;  Surgeon: West Bali, MD;  Location: [facility];  Service: Endoscopy;;  pyloric dilation   BIOPSY  12/20/2017   Procedure: BIOPSY;  Surgeon: West Bali, MD;  Location: [facility];  Service: Endoscopy;;  duodenal and gastric biopsy   BIOPSY  07/15/2021   Procedure: BIOPSY;  Surgeon: Dolores Frame, MD;  Location: [facility];  Service: Gastroenterology;;   COLONOSCOPY  01/18/2008   OZD:GUYQIH rectum.  Long redundant colon, a diminutive sigmoid polyp status post cold biopsy removed. Hyperplastic polyp. Repeat colonoscopy June 2014 due to family history of colon cancer   COLONOSCOPY WITH ESOPHAGOGASTRODUODENOSCOPY (EGD) N/A 11/02/2012   KVQ:QVZDGLOV gastric mucosa of doubtful, +H.pylori. Incomplete colonoscopy due to patient unable to tolerate exam, proximal colon seen. Patient  refused ACBE.   COLONOSCOPY WITH PROPOFOL N/A 03/08/2013   Dr. Jena Gauss: colonic polyp-removed as scribed above. Internal Hemorrhoids. Pathology did not reveal any colonic tissue, only mucus. SURVEILLANCE DUE Aug 2019   COLONOSCOPY WITH PROPOFOL N/A 01/19/2018   Dr. Jena Gauss: 6 mm cecal inflammatoy polyp, otherwise normal., Surveilance in 5 year s   ESOPHAGEAL DILATION  12/20/2017   Procedure: ESOPHAGEAL DILATION;  Surgeon: West Bali, MD;  Location: [facility];  Service: Endoscopy;;   ESOPHAGOGASTRODUODENOSCOPY  01/18/2008   RMR: Normal esophagus, normal  stomach   ESOPHAGOGASTRODUODENOSCOPY (EGD) WITH PROPOFOL N/A 02/09/2016   Normal esophagus, small hiatal hernia, portal hypertensive gastropathy, normal second portion of duodenum. Due in July 2019   ESOPHAGOGASTRODUODENOSCOPY (EGD) WITH PROPOFOL N/A 12/20/2017   Dr. Darrick Penna: benign appearing esophageal stenosis s/p dilation, mild pyloric stenosis s/p biopsy and dilation, mild duodenitis.    ESOPHAGOGASTRODUODENOSCOPY (EGD) WITH PROPOFOL N/A 06/12/2020   Rourk: Normal esophagus, portal hypertensive gastropathy, no specimens collected.  Tentatively plan for repeat EGD in 2 years.   ESOPHAGOGASTRODUODENOSCOPY (EGD) WITH PROPOFOL N/A 07/15/2021   Procedure: ESOPHAGOGASTRODUODENOSCOPY (EGD) WITH PROPOFOL;  Surgeon: Dolores Frame, MD;  Location: [facility];  Service: Gastroenterology;  Laterality: N/A;   HYSTEROSCOPY  05/11/2016   Procedure: HYSTEROSCOPY;  Surgeon: Tilda Burrow, MD;  Location: [facility];  Service: Gynecology;;   KNEE SURGERY     left knee   MULTIPLE EXTRACTIONS WITH ALVEOLOPLASTY N/A 10/15/2013  Procedure: MULTIPLE EXTRACTION WITH ALVEOLOPLASTY;  Surgeon: Georgia Lopes, DDS;  Location: [facility];  Service: Oral Surgery;  Laterality: N/A;   POLYPECTOMY N/A 03/08/2013   Procedure: POLYPECTOMY;  Surgeon: Corbin Ade, MD;  Location: [facility];  Service: Endoscopy;  Laterality: N/A;   POLYPECTOMY N/A 05/11/2016   Procedure:  REMOVAL OF ENDOMETRIAL POLYP;  Surgeon: Tilda Burrow, MD;  Location: [facility];  Service: Gynecology;  Laterality: N/A;   RESECTION DISTAL CLAVICAL Right 01/30/2019   Procedure: RESECTION DISTAL CLAVICAL;  Surgeon: Vickki Hearing, MD;  Location: [facility];  Service: Orthopedics;  Laterality: Right;   SHOULDER OPEN ROTATOR CUFF REPAIR Right 01/30/2019   Procedure: ROTATOR CUFF REPAIR SHOULDER OPEN;  Surgeon: Vickki Hearing, MD;  Location: [facility];  Service: Orthopedics;  Laterality: Right;  pt to arrive at 7:30 for PICC at 8:00   TOTAL KNEE ARTHROPLASTY Left 02/05/2015   Procedure: LEFT TOTAL KNEE ARTHROPLASTY;  Surgeon: Vickki Hearing, MD;  Location: [facility];  Service: Orthopedics;  Laterality: Left;   HPI:  Colleen Lee is a 65 y.o. female with a history of alcoholic liver cirrhosis, asthma, [name], hypertension. Patient presented secondary to vomiting and abdominal pain. On admission she was also suffering from metabolic encephalopathy. CT abdomen/pelvis without acute process identified. GI consulted on admission. SIRS criteria met without source. Procalcitonin elevated. Ceftriaxone/Flagyl initiated. Blood cultures pending. Pt had EGD yesterday and positive for candida esophagitis.    Assessment / Plan / Recommendation  Clinical Impression  Clinical swallow evaluation completed at bedside with Pt's sister present. Pt with improved symptoms and now being treated for candida esophagitis. Oral motor examination is WNL. Pt shows no overt signs of symptoms of aspiration with regular textures and thin liquids. OK to resume diet and no futher SLP services indicated. Reflux precautions reviewed. SLP Visit Diagnosis: Dysphagia, unspecified (R13.10)    Aspiration Risk  No limitations    Diet Recommendation Regular;Thin liquid   Liquid Administration via: Cup;Straw Medication Administration: Whole meds with liquid Supervision: Patient able to self feed Postural Changes: Seated upright at  90 degrees;Remain upright for at least 30 minutes after po intake    Other  Recommendations Oral Care Recommendations: Oral care BID Other Recommendations: Clarify dietary restrictions    Recommendations for follow up therapy are one component of a multi-disciplinary discharge planning process, led by the attending physician.  Recommendations may be updated based on patient status, additional functional criteria and insurance authorization.  Follow up Recommendations No SLP follow up      Assistance Recommended at Discharge    Functional Status Assessment    Frequency and Duration            Prognosis Prognosis for Safe Diet Advancement: Good      Swallow Study   General Date of Onset: 07/13/21 HPI: Colleen Lee is a 65 y.o. female with a history of alcoholic liver cirrhosis, asthma, [name], hypertension. Patient presented secondary to vomiting and abdominal pain. On admission she was also suffering from metabolic encephalopathy. CT abdomen/pelvis without acute process identified. GI consulted on admission. SIRS criteria met without source. Procalcitonin elevated. Ceftriaxone/Flagyl initiated. Blood cultures pending. Pt had EGD yesterday and positive for candida esophagitis. Type of Study: Bedside Swallow Evaluation Previous Swallow Assessment: N/A Diet Prior to this Study:  (full liquids) Temperature Spikes Noted: No Respiratory Status: Room air History of Recent Intubation: No Behavior/Cognition: Alert;Cooperative;Pleasant mood Oral Cavity Assessment: Within Functional Limits Oral Care Completed by SLP: Yes  Oral Cavity - Dentition: Missing dentition Vision: Functional for self-feeding Self-Feeding Abilities: Able to feed self Patient Positioning: Upright in bed Baseline Vocal Quality: Normal Volitional Cough: Strong Volitional Swallow: Able to elicit    Oral/Motor/Sensory Function Overall Oral Motor/Sensory Function: Within functional limits   Ice Chips Ice chips:  Within functional limits Presentation: Spoon   Thin Liquid Thin Liquid: Within functional limits Presentation: Cup;Self Fed;Straw    Nectar Thick Nectar Thick Liquid: Not tested   Honey Thick Honey Thick Liquid: Not tested   Puree Puree: Within functional limits Presentation: Spoon   Solid     Solid: Within functional limits Presentation: Self Fed     Thank you,  Havery Moros, CCC-SLP [phone]  Paolo Okane 07/16/2021,3:15 PM

## 2021-07-16 NOTE — Discharge Summary (Signed)
Colleen Lee, is a 65 y.o. female  DOB [birth year]  MRN [id number].  Admission date:  07/13/2021  Admitting Physician  Roxan Hockey, MD  Discharge Date:  07/16/2021   Primary MD  Sandi Mariscal, MD  Recommendations for primary care physician for things to follow:   1) complete abstinence from alcohol advised 2) please note that there has been several changes to your medications 3) repeat CBC and CMP blood test on Monday, 07/20/2021 advised 4) follow-up with gastroenterologist in 3 weeks advised--Follow up with Dr. Jenetta Downer-- address [address], [address], [place] [address],,Phone Number [phone]    Admission Diagnosis  Malaise [R53.81] Weakness [R53.1] Intractable vomiting [R11.10] Nausea and vomiting, unspecified vomiting type [R11.2] Portal hypertension (HCC) [K76.6]   Discharge Diagnosis  Malaise [R53.81] Weakness [R53.1] Intractable vomiting [R11.10] Nausea and vomiting, unspecified vomiting type [R11.2] Portal hypertension (HCC) [K76.6]   Principal Problem:   Intractable vomiting Active Problems:   Chronic [name] with cirrhosis (HCC)   Alcoholic cirrhosis of liver without ascites (HCC)   Essential hypertension   Weakness   Malaise   Acute metabolic encephalopathy   SIRS (systemic inflammatory response syndrome) (HCC)   RUQ abdominal pain   Anemia   Portal hypertension (HCC)      Past Medical History:  Diagnosis Date   Anemia    Asthma    Chronic abdominal pain    Chronic back pain    Chronic constipation    Cirrhosis (HCC)    Metavir score F4 on elastography 5974   Cyclical vomiting    Fibroids    Fibromyalgia    GERD (gastroesophageal reflux disease)    H. pylori infection 2014   treated with pylera, had to be treated again as it was not eradicated. Urea breath test then negative after subsequent treatment.    [name]    HCV RNA positive 09/2012    Hypertension    Marijuana use    Nausea and vomiting    chronic, recurrent   PONV (postoperative nausea and vomiting)    pt thinks maybe once she had N&V   Sciatica of left side     Past Surgical History:  Procedure Laterality Date   BALLOON DILATION  12/20/2017   Procedure: BALLOON DILATION;  Surgeon: Danie Binder, MD;  [facility];  Service: Endoscopy;;  pyloric dilation   BIOPSY  12/20/2017   Procedure: BIOPSY;  Surgeon: Danie Binder, MD;  [facility];  Service: Endoscopy;;  duodenal and gastric biopsy   BIOPSY  07/15/2021   Procedure: BIOPSY;  Surgeon: Harvel Quale, MD;  [facility];  Service: Gastroenterology;;   COLONOSCOPY  01/18/2008   BUL:AGTXMI rectum.  Long redundant colon, a diminutive sigmoid polyp status post cold biopsy removed. Hyperplastic polyp. Repeat colonoscopy June 2014 due to family history of colon cancer   COLONOSCOPY WITH ESOPHAGOGASTRODUODENOSCOPY (EGD) N/A 11/02/2012   WOE:HOZYYQMG gastric mucosa of doubtful, +H.pylori. Incomplete colonoscopy due to patient unable to tolerate exam, proximal  colon seen. Patient refused ACBE.   COLONOSCOPY WITH PROPOFOL N/A 03/08/2013   Dr. Gala Romney: colonic polyp-removed as scribed above. Internal Hemorrhoids. Pathology did not reveal any colonic tissue, only mucus. SURVEILLANCE DUE Aug 2019   COLONOSCOPY WITH PROPOFOL N/A 01/19/2018   Dr. Gala Romney: 6 mm cecal inflammatoy polyp, otherwise normal., Surveilance in 5 year s   ESOPHAGEAL DILATION  12/20/2017   Procedure: ESOPHAGEAL DILATION;  Surgeon: Danie Binder, MD;  [facility];  Service: Endoscopy;;   ESOPHAGOGASTRODUODENOSCOPY  01/18/2008   RMR: Normal esophagus, normal  stomach   ESOPHAGOGASTRODUODENOSCOPY (EGD) WITH PROPOFOL N/A 02/09/2016   Normal esophagus, small hiatal hernia, portal hypertensive gastropathy, normal second portion of duodenum. Due in July 2019   ESOPHAGOGASTRODUODENOSCOPY (EGD) WITH PROPOFOL N/A  12/20/2017   Dr. Oneida Alar: benign appearing esophageal stenosis s/p dilation, mild pyloric stenosis s/p biopsy and dilation, mild duodenitis.    ESOPHAGOGASTRODUODENOSCOPY (EGD) WITH PROPOFOL N/A 06/12/2020   Rourk: Normal esophagus, portal hypertensive gastropathy, no specimens collected.  Tentatively plan for repeat EGD in 2 years.   ESOPHAGOGASTRODUODENOSCOPY (EGD) WITH PROPOFOL N/A 07/15/2021   Procedure: ESOPHAGOGASTRODUODENOSCOPY (EGD) WITH PROPOFOL;  Surgeon: Harvel Quale, MD;  [facility];  Service: Gastroenterology;  Laterality: N/A;   HYSTEROSCOPY  05/11/2016   Procedure: HYSTEROSCOPY;  Surgeon: Jonnie Kind, MD;  [facility];  Service: Gynecology;;   KNEE SURGERY     left knee   MULTIPLE EXTRACTIONS WITH ALVEOLOPLASTY N/A 10/15/2013   Procedure: MULTIPLE EXTRACTION WITH ALVEOLOPLASTY;  Surgeon: Gae Bon, DDS;  Location: [facility];  Service: Oral Surgery;  Laterality: N/A;   POLYPECTOMY N/A 03/08/2013   Procedure: POLYPECTOMY;  Surgeon: Daneil Dolin, MD;  [facility];  Service: Endoscopy;  Laterality: N/A;   POLYPECTOMY N/A 05/11/2016   Procedure: REMOVAL OF ENDOMETRIAL POLYP;  Surgeon: Jonnie Kind, MD;  Location[facility];  Service: Gynecology;  Laterality: N/A;   RESECTION DISTAL CLAVICAL Right 01/30/2019   Procedure: RESECTION DISTAL CLAVICAL;  Surgeon: Carole Civil, MD;  [facility];  Service: Orthopedics;  Laterality: Right;   SHOULDER OPEN ROTATOR CUFF REPAIR Right 01/30/2019   Procedure: ROTATOR CUFF REPAIR SHOULDER OPEN;  Surgeon: Carole Civil, MD;  [facility];  Service: Orthopedics;  Laterality: Right;  pt to arrive at 7:30 for PICC at 8:00   [place] Left 02/05/2015   Procedure: LEFT TOTAL KNEE ARTHROPLASTY;  Surgeon: Carole Civil, MD;  Location[facility];  Service: Orthopedics;  Laterality: Left;       HPI  from the history and physical done on the day of admission:   Chief Complaint:  Vomiting, abdominal pain   HPI: Colleen Lee is a 65 y.o. female with medical history significant for alcoholic liver cirrhosis, asthma, [name]. Patient was brought to the ED with reports of generalized weakness and vomiting.  For about a week.  At the time of my evaluation patient appears lethargic, is able to answer questions, sister Lattie Haw at bedside and assist with the history.  Patient lives with her husband.   Sister Lattie Haw reports that since patient was diagnosed with a stroke about 2 months ago, patient has declined.  She reports patient had abnormal gait, prompting work-up for which she was diagnosed with a stroke.  She was initially able to walk, but over the past 2 weeks patient has not been able to walk.  And he noticed that her grip on the left upper arm was weaker  than her right.  She has had poor oral intake.  [name] speech has also been sluggish, but they are able to understand what she says.  Patient tells me she has upper abdominal pain. Sister denies any alcohol intake.  No cough no difficulty breathing noted.  Patient denies back or neck pain.   ED Course: Temperature 97.5.  Heart rate 104-126.  Respiratory rate 12-18.  Blood pressure systolic 081 -448.  O2 sats 99-100 on room air.  WBC 12.4.  Stable creatinine.  CT abdomen and pelvis was obtained, was negative for any acute findings within the abdomen and pelvis. GI was consulted, recommended admission, supportive care for now.  May need to consider endoscopy evaluation when she stabilizes from a mentation standpoint. 100 mill bolus given.  Hospitalist to admit.       Hospital Course:    Intractable vomiting -GI input appreciated patient underwent EGD with finding of portal hypertensive gastropathy throughout the stomach -Outpatient follow-up with GI advised -Nausea and vomiting improved, patient tolerating oral intake -Speech pathology eval appreciated recommends regular solids and thin liquid   SIRS (systemic  inflammatory response syndrome) (HCC) No source identified. Imaging with no specific findings.  -UA not suggestive of UTI, blood cultures negative, -SIRS pathophysiology resolved - Acute metabolic encephalopathy Unknown etiology. Normal ammonia. -Suspect dehydration related, resolved at this time, mentation back to baseline   Weakness -PT/OT eval--appreciated recommends SNF rehab, patient declines, patient requesting home health services instead   Essential hypertension -Continue Labetalol 185 mg BD   Alcoholic cirrhosis of liver cirrhosis/hep C -LFTs noted   -Patient declines SNF wants to go home with home health, patient's sister at bedside questions answered  Code Status:   Code Status: Full Code Family Communication: sister at bedside Disposition Plan: --Patient declines SNF wants to go home with home health, patient's sister at bedside questions answered  Discharge Condition: stable  Follow UP   Follow-up Information     [facility].   Specialty: [facility] Why: PT/RN will call to schedule your first home visit.        Montez Morita, Quillian Quince, MD Follow up in 3 week(s).   Specialty: Gastroenterology Contact information: [address] [place] [address] [phone]                  Consults obtained -Gi  Diet and Activity recommendation:  As advised  Discharge Instructions    Discharge Instructions     Call MD for:  difficulty breathing, headache or visual disturbances   Complete by: As directed    Call MD for:  persistant dizziness or light-headedness   Complete by: As directed    Call MD for:  persistant nausea and vomiting   Complete by: As directed    Call MD for:  temperature >100.4   Complete by: As directed    Diet - low sodium heart healthy   Complete by: As directed    Discharge instructions   Complete by: As directed    1) complete abstinence from alcohol advised 2)  please note that there has been several changes to your medications 3) repeat CBC and CMP blood test on Monday, 07/20/2021 advised 4) follow-up with gastroenterologist in 3 weeks advised--Follow up with Dr. Jenetta Downer-- address [address], [address], [place] [address],,AJOIN Number [phone]   Increase activity slowly   Complete by: As directed          Discharge Medications  Allergies as of 07/16/2021       Reactions   Penicillins Rash   Did it involve swelling of the face/tongue/throat, SOB, or low BP? No Did it involve sudden or severe rash/hives, skin peeling, or any reaction on the inside of your mouth or nose? No Did you need to seek medical attention at a hospital or doctor's office? No When did it last happen? Many years ago      If all above answers are NO, may proceed with cephalosporin use.        Medication List     STOP taking these medications    hydrALAZINE 50 MG tablet Commonly known as: APRESOLINE   labetalol 100 MG tablet Commonly known as: NORMODYNE   ondansetron 4 MG disintegrating tablet Commonly known as: Zofran ODT   polyethylene glycol 17 g packet Commonly known as: MiraLax   polyethylene glycol 236 g solution Commonly known as: GoLYTELY       TAKE these medications    albuterol 108 (90 Base) MCG/ACT inhaler Commonly known as: VENTOLIN HFA Inhale 2 puffs into the lungs every 6 (six) hours as needed for wheezing or shortness of breath.   allopurinol 300 MG tablet Commonly known as: ZYLOPRIM Take 300 mg by mouth daily.   atenolol 25 MG tablet Commonly known as: Tenormin Take 1 tablet (25 mg total) by mouth 2 (two) times daily.   cyclobenzaprine 5 MG tablet Commonly known as: FLEXERIL Take 5 mg by mouth 3 (three) times daily.   Dialyvite Vitamin D3 Max 1.25 MG (50000 UT) Tabs Generic drug: Cholecalciferol Take 1 tablet by mouth every 7 (seven) days.   fluconazole 200 MG tablet Commonly known as:  Diflucan Take 1 tablet (200 mg total) by mouth daily for 21 days.   folic acid 1 MG tablet Commonly known as: FOLVITE Take 1 tablet (1 mg total) by mouth daily.   gabapentin 100 MG capsule Commonly known as: NEURONTIN Take 1 capsule (100 mg total) by mouth 3 (three) times daily. What changed: when to take this   HYDROcodone-acetaminophen 5-325 MG tablet Commonly known as: NORCO/VICODIN Take 1 tablet by mouth every 6 (six) hours as needed.   hydrOXYzine 10 MG tablet Commonly known as: ATARAX Take 1 tablet (10 mg total) by mouth daily as needed.   lactulose 10 GM/15ML solution Commonly known as: CHRONULAC Take 30 mLs (20 g total) by mouth 2 (two) times daily as needed for mild constipation or moderate constipation.   megestrol 40 MG tablet Commonly known as: MEGACE Take 1 tablet (40 mg total) by mouth daily.   mupirocin ointment 2 % Commonly known as: BACTROBAN Apply topically 2 (two) times daily.   naloxone 4 MG/0.1ML Liqd nasal spray kit Commonly known as: NARCAN See admin instructions. ADMINISTER A SINGLE SPRAYEOF NARCAN INTO ONECNOSTRIL. REPEAT AFTER 3SMINUTES IN OTHER NOSTRIL IF NO RESPONSE.   ondansetron 4 MG tablet Commonly known as: ZOFRAN Take 1 tablet (4 mg total) by mouth every 6 (six) hours as needed for nausea. What changed:  when to take this reasons to take this   pantoprazole 40 MG tablet Commonly known as: PROTONIX Take 1 tablet (40 mg total) by mouth 2 (two) times daily.   potassium chloride 10 MEQ CR capsule Commonly known as: MICRO-K Take 20 mEq by mouth daily.   prednisoLONE acetate 1 % ophthalmic suspension Commonly known as: PRED FORTE Place 1 drop into both eyes 2 (two) times daily.   sucralfate 1 g tablet Commonly known as:  Carafate Take 1 tablet (1 g total) by mouth 4 (four) times daily -  with meals and at bedtime for 7 days.   thiamine 100 MG tablet Take 1 tablet (100 mg total) by mouth daily.   Trulance 3 MG Tabs Generic drug:  Plecanatide Take 3 mg by mouth daily.        Major procedures and Radiology Reports - PLEASE review detailed and final reports for all details, in brief -   MR BRAIN WO CONTRAST  Result Date: 07/13/2021 CLINICAL DATA:  Mental status changes. Lethargy. Speech disturbance. Lower extremity weakness. EXAM: MRI HEAD WITHOUT CONTRAST TECHNIQUE: Multiplanar, multiecho pulse sequences of the brain and surrounding structures were obtained without intravenous contrast. COMPARISON:  Head CT 05/12/2014 FINDINGS: Brain: Diffusion imaging does not show any acute or subacute infarction. Chronic small-vessel ischemic changes are seen throughout the pons. No focal cerebellar finding. Cerebral hemispheres show atrophy with mild to moderate chronic small-vessel ischemic changes of the white matter. No cortical or large vessel territory infarction. No mass lesion, hemorrhage, hydrocephalus or extra-axial collection. Vascular: Flow is present in both internal carotid arteries. Dominant left vertebral artery shows flow. There may not be flow in the small right vertebral artery. Skull and upper cervical spine: Normal Sinuses/Orbits: Clear/normal Other: None IMPRESSION: No acute brain finding. Atrophy. Chronic small-vessel ischemic changes of the pons and cerebral hemispheric white matter. Question abnormal flow in the non dominant right vertebral artery. Dominant left vertebral artery appears widely patent to the basilar. Electronically Signed   By: Nelson Chimes M.D.   On: 07/13/2021 18:28   CT ABDOMEN PELVIS W CONTRAST  Result Date: 07/09/2021 CLINICAL DATA:  Generalized abdominal pain for 2 days. EXAM: CT ABDOMEN AND PELVIS WITH CONTRAST TECHNIQUE: Multidetector CT imaging of the abdomen and pelvis was performed using the standard protocol following bolus administration of intravenous contrast. CONTRAST:  120m OMNIPAQUE IOHEXOL 300 MG/ML  SOLN COMPARISON:  CT scan 05/31/2021 FINDINGS: Lower chest: The lung bases are  clear of acute process. No pleural effusion or pulmonary lesions. The heart is normal in size. No pericardial effusion. Age advanced atherosclerotic calcifications involving the descending thoracic aorta. Hepatobiliary: Diffuse and severe fatty infiltration of the liver but no focal hepatic lesions or intrahepatic biliary dilatation. The portal and hepatic veins are patent. The gallbladder is unremarkable. No common bile duct dilatation. Pancreas: No mass, inflammation or ductal dilatation. Spleen: Normal size.  No focal lesions. Adrenals/Urinary Tract: Adrenal glands and kidneys are unremarkable no renal lesions or hydronephrosis. The bladder is unremarkable. Stomach/Bowel: The stomach, duodenum, small bowel and colon are grossly normal. No acute inflammatory changes, mass lesions or obstructive findings. The terminal ileum and appendix are normal. Vascular/Lymphatic: Stable age advanced atherosclerotic calcifications involving the aorta and iliac arteries but no aneurysm or dissection. The branch vessels are patent. The major venous structures are patent. No mesenteric or retroperitoneal mass or adenopathy. Reproductive: Calcified uterine fibroids are noted. The ovaries are unremarkable. Other: No pelvic mass or adenopathy. No free pelvic fluid collections. No inguinal mass or adenopathy. No abdominal wall hernia or subcutaneous lesions. Musculoskeletal: No significant bony findings. IMPRESSION: 1. No acute abdominal/pelvic findings, mass lesions or adenopathy. 2. Diffuse and severe fatty infiltration of the liver. 3. Age advanced atherosclerotic calcifications involving the aorta and iliac arteries. 4. Calcified uterine fibroids. Aortic Atherosclerosis (ICD10-I70.0). Electronically Signed   By: PMarijo SanesM.D.   On: 07/09/2021 15:39   DG Chest Portable 1 View  Result Date: 07/09/2021 CLINICAL DATA:  Abdominal  pain EXAM: PORTABLE CHEST 1 VIEW COMPARISON:  December 2019 FINDINGS: The heart size and  mediastinal contours are within normal limits. No new consolidation or edema. No pleural effusion. No pneumothorax. The visualized skeletal structures are unremarkable. No free air under the diaphragms. IMPRESSION: No acute process in the chest. Electronically Signed   By: Macy Mis M.D.   On: 07/09/2021 12:48   CT Angio Abd/Pel W and/or Wo Contrast  Result Date: 07/13/2021 CLINICAL DATA:  Nausea, weakness and weight loss for the past week. Concern for acute mesenteric ischemia. EXAM: CTA ABDOMEN AND PELVIS WITHOUT AND WITH CONTRAST TECHNIQUE: Multidetector CT imaging of the abdomen and pelvis was performed using the standard protocol during bolus administration of intravenous contrast. Multiplanar reconstructed images and MIPs were obtained and reviewed to evaluate the vascular anatomy. CONTRAST:  104m OMNIPAQUE IOHEXOL 300 MG/ML  SOLN COMPARISON:  CT abdomen pelvis-07/09/2021; 05/31/2021; 05/11/2021 FINDINGS: VASCULAR Aorta: There is a moderate amount predominantly calcified atherosclerotic plaque within normal caliber abdominal aorta, not resulting in a hemodynamically significant stenosis. No abdominal aortic dissection or periaortic stranding. Celiac: Widely patent without a hemodynamically significant narrowing. Conventional branching pattern. SMA: Widely patent without hemodynamically significant narrowing. Conventional branching pattern. The distal tributaries the SMA appear widely patent without discrete lumen filling defect to suggest distal embolism. Renals: Solitary bilaterally; there is a large amount of eccentric noncalcified atherosclerotic plaque involving the origin of the left renal artery resulting in short segment severe (greater 75%) luminal narrowing (axial image 74, series 4) and while this finding may be associated with slight asymmetric left-sided renal atrophy, there is no definitive delayed renal enhancement. The right renal artery is widely patent without hemodynamically  significant narrowing. No vessel irregularity to suggest FMD. 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 Inflow: There is a minimal amount of eccentric calcified and noncalcified atherosclerotic plaque involving the bilateral common iliac arteries, not resulting in a hemodynamically significant stenosis. The bilateral internal iliac arteries are mildly disease though patent and of normal caliber. The bilateral external iliac arteries are of normal caliber and widely patent without hemodynamically significant narrowing. Proximal Outflow: There is a minimal amount of eccentric mixed calcified and noncalcified atherosclerotic plaque involving the bilateral common femoral arteries, not resulting in a hemodynamically significant stenosis. Eccentric calcified and noncalcified atherosclerotic plaque approaches 50% luminal narrowing involving the proximal aspects of the bilateral superficial femoral arteries (left image 204, series 4; right image 207, series 4). Veins: The IVC and pelvic venous systems appear patent. Review of the MIP images confirms the above findings. _________________________________________________________ NON-VASCULAR Lower chest: Limited visualization of the lower thorax demonstrates minimal subpleural ground-glass atelectasis. No discrete focal airspace opacities. No pleural effusion. Normal heart size.  No pericardial effusion. Hepatobiliary: There is diffuse decreased attenuation of the hepatic parenchyma compatible with hepatic steatosis. No discrete hyperenhancing hepatic lesions. Normal early arterial phase appearance of the gallbladder given degree distention. No radiopaque gallstones. No intra or extrahepatic biliary duct dilatation. No ascites. Pancreas: The pancreas appears somewhat atretic but without definitive evidence of peripancreatic stranding on this early arterial phase examination. Spleen: Normal appearance of the spleen. No evidence of  splenomegaly. Adrenals/Urinary Tract: There is symmetric enhancement of the bilateral kidneys. Suspected mild atrophy of the left kidney in comparison to the right. No evidence of nephrolithiasis on this postcontrast examination. No discrete renal lesions. There is a very minimal amount of bilateral perinephric stranding, likely age and body habitus related. No evidence of urinary obstruction.  Normal appearance of the bilateral adrenal glands. Normal appearance of the urinary bladder given degree of distention. Stomach/Bowel: Mild gaseous distension of predominantly the transverse colon, without evidence of enteric obstruction. Normal appearance of the terminal ileum and the retrocecal appendix. No discrete areas of bowel wall thickening. No hiatal hernia. No pneumoperitoneum, pneumatosis or portal venous gas. Lymphatic: No bulky retroperitoneal, mesenteric, pelvic or inguinal lymphadenopathy. Reproductive: Redemonstrated dystrophic fibroid within the right-side of the uterus. No discrete adnexal lesions. No free fluid the pelvic cul-de-sac. Other: Mild diffuse body wall anasarca, most conspicuous about the midline of the low back. Tiny mesenteric fat containing periumbilical hernia. Musculoskeletal: No acute or aggressive osseous abnormalities. Stigmata of dish within the lower thoracic spine. Mild-to-moderate degenerative change the bilateral hips with joint space loss, subchondral sclerosis and osteophytosis. IMPRESSION: VASCULAR 1. Moderate amount of atherosclerotic plaque within a normal caliber abdominal aorta, not resulting in a hemodynamically significant stenosis. 2. No evidence of acute or chronic mesenteric ischemia. 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 Both the SMA and celiac arteries are widely patent without a hemodynamically significant stenosis. 3. Noncalcified atherosclerotic plaque results in short segment severe (greater 70%)  luminal narrowing of the left renal artery with associated mild asymmetric renal atrophy but without associated delayed renal enhancement. 4. Atherosclerotic plaque involving the proximal aspects of the bilateral superficial femoral arteries results in approximately 50% luminal narrowing bilaterally. Correlation for symptoms of PAD is advised. NON-VASCULAR 1. No acute findings within the abdomen or pelvis. Specifically, no evidence of enteric or urinary obstruction. 2. No definitive evidence of recurrent acute or chronic pancreatitis. Further evaluation with acquisition of amylase and lipase levels could be performed as indicated. 3. Advanced hepatic steatosis.  Correlation with LFTs is advised. Electronically Signed   By: Sandi Mariscal M.D.   On: 07/13/2021 16:03    Micro Results  Recent Results (from the past 240 hour(s))  Resp Panel by RT-PCR (Flu A&B, Covid) Nasopharyngeal Swab     Status: None   Collection Time: 07/09/21  1:39 PM   Specimen: Nasopharyngeal Swab; Nasopharyngeal(NP) swabs in vial transport medium  Result Value Ref Range Status   SARS Coronavirus 2 by RT PCR NEGATIVE NEGATIVE Final    Comment: (NOTE) SARS-CoV-2 target nucleic acids are NOT DETECTED.  The SARS-CoV-2 RNA is generally detectable in upper respiratory specimens during the acute phase of infection. The lowest concentration of SARS-CoV-2 viral copies this assay can detect is 138 copies/mL. A negative result does not preclude SARS-Cov-2 infection and should not be used as the sole basis for treatment or other patient management decisions. A negative result may occur with  improper specimen collection/handling, submission of specimen other than nasopharyngeal swab, presence of viral mutation(s) within the areas targeted by this assay, and inadequate number of viral copies(<138 copies/mL). A negative result must be combined with clinical observations, patient history, and epidemiological information. The expected  result is Negative.  Fact Sheet for Patients:  EntrepreneurPulse.com.au  Fact Sheet for Healthcare Providers:  IncredibleEmployment.be  This test is no t yet approved or cleared by the [place] FDA and  has been authorized for detection and/or diagnosis of SARS-CoV-2 by FDA under an Emergency Use Authorization (EUA). This EUA will remain  in effect (meaning this test can be used) for the duration of the COVID-19 declaration under Section 564(b)(1) of the Act, 21 U.S.C.section 360bbb-3(b)(1), unless the authorization is terminated  or revoked sooner.  Influenza A by PCR NEGATIVE NEGATIVE Final   Influenza B by PCR NEGATIVE NEGATIVE Final    Comment: (NOTE) The Xpert Xpress SARS-CoV-2/FLU/RSV plus assay is intended as an aid in the diagnosis of influenza from Nasopharyngeal swab specimens and should not be used as a sole basis for treatment. Nasal washings and aspirates are unacceptable for Xpert Xpress SARS-CoV-2/FLU/RSV testing.  Fact Sheet for Patients: EntrepreneurPulse.com.au  Fact Sheet for Healthcare Providers: IncredibleEmployment.be  This test is not yet approved or cleared by the [place] FDA and has been authorized for detection and/or diagnosis of SARS-CoV-2 by FDA under an Emergency Use Authorization (EUA). This EUA will remain in effect (meaning this test can be used) for the duration of the COVID-19 declaration under Section 564(b)(1) of the Act, 21 U.S.C. section 360bbb-3(b)(1), unless the authorization is terminated or revoked.  Performed at [facility], [address], [place], [place] [address]   Resp Panel by RT-PCR (Flu A&B, Covid) Nasopharyngeal Swab     Status: None   Collection Time: 07/13/21  3:30 PM   Specimen: Nasopharyngeal Swab; Nasopharyngeal(NP) swabs in vial transport medium  Result Value Ref Range Status   SARS Coronavirus 2 by RT PCR NEGATIVE NEGATIVE  Final    Comment: (NOTE) SARS-CoV-2 target nucleic acids are NOT DETECTED.  The SARS-CoV-2 RNA is generally detectable in upper respiratory specimens during the acute phase of infection. The lowest concentration of SARS-CoV-2 viral copies this assay can detect is 138 copies/mL. A negative result does not preclude SARS-Cov-2 infection and should not be used as the sole basis for treatment or other patient management decisions. A negative result may occur with  improper specimen collection/handling, submission of specimen other than nasopharyngeal swab, presence of viral mutation(s) within the areas targeted by this assay, and inadequate number of viral copies(<138 copies/mL). A negative result must be combined with clinical observations, patient history, and epidemiological information. The expected result is Negative.  Fact Sheet for Patients:  EntrepreneurPulse.com.au  Fact Sheet for Healthcare Providers:  IncredibleEmployment.be  This test is no t yet approved or cleared by the [place] FDA and  has been authorized for detection and/or diagnosis of SARS-CoV-2 by FDA under an Emergency Use Authorization (EUA). This EUA will remain  in effect (meaning this test can be used) for the duration of the COVID-19 declaration under Section 564(b)(1) of the Act, 21 U.S.C.section 360bbb-3(b)(1), unless the authorization is terminated  or revoked sooner.       Influenza A by PCR NEGATIVE NEGATIVE Final   Influenza B by PCR NEGATIVE NEGATIVE Final    Comment: (NOTE) The Xpert Xpress SARS-CoV-2/FLU/RSV plus assay is intended as an aid in the diagnosis of influenza from Nasopharyngeal swab specimens and should not be used as a sole basis for treatment. Nasal washings and aspirates are unacceptable for Xpert Xpress SARS-CoV-2/FLU/RSV testing.  Fact Sheet for Patients: EntrepreneurPulse.com.au  Fact Sheet for Healthcare  Providers: IncredibleEmployment.be  This test is not yet approved or cleared by the [place] FDA and has been authorized for detection and/or diagnosis of SARS-CoV-2 by FDA under an Emergency Use Authorization (EUA). This EUA will remain in effect (meaning this test can be used) for the duration of the COVID-19 declaration under Section 564(b)(1) of the Act, 21 U.S.C. section 360bbb-3(b)(1), unless the authorization is terminated or revoked.  Performed at [facility], [address] [place], [place] [address]   Culture, blood (routine x 2)     Status: None (Preliminary result)   Collection Time:  07/13/21 10:53 PM   Specimen: BLOOD LEFT WRIST  Result Value Ref Range Status   Specimen Description BLOOD LEFT WRIST  Final   Special Requests   Final    BOTTLES DRAWN AEROBIC AND ANAEROBIC Blood Culture results may not be optimal due to an inadequate volume of blood received in culture bottles   Culture   Final    NO GROWTH < 12 HOURS Performed at [facility], [address], [place], [place] [address]    Report Status PENDING  Incomplete  Culture, blood (routine x 2)     Status: None (Preliminary result)   Collection Time: 07/14/21 10:00 AM   Specimen: BLOOD LEFT FOREARM  Result Value Ref Range Status   Specimen Description BLOOD LEFT FOREARM  Final   Special Requests   Final    BOTTLES DRAWN AEROBIC AND ANAEROBIC Blood Culture adequate volume Performed at [facility], [address] [place], [place] [address]    Culture PENDING  Incomplete   Report Status PENDING  Incomplete  Culture, blood (routine x 2)     Status: None (Preliminary result)   Collection Time: 07/14/21 10:10 AM   Specimen: BLOOD RIGHT HAND  Result Value Ref Range Status   Specimen Description BLOOD RIGHT HAND  Final   Special Requests   Final    BOTTLES DRAWN AEROBIC ONLY Blood Culture results may not be optimal due to an inadequate volume of blood received in culture bottles Performed  at [facility], [address] [place], [place] [address]    Culture PENDING  Incomplete   Report Status PENDING  Incomplete  Culture, blood (routine x 2)     Status: None (Preliminary result)   Collection Time: 07/14/21 10:15 AM   Specimen: BLOOD LEFT HAND  Result Value Ref Range Status   Specimen Description BLOOD LEFT HAND  Final   Special Requests   Final    BOTTLES DRAWN AEROBIC AND ANAEROBIC Blood Culture adequate volume Performed at [facility], [address] [place], [place] [address]    Culture PENDING  Incomplete   Report Status PENDING  Incomplete       Today   Subjective    Colleen Lee today has no new concerns  No fever  Or chills  nausea, vomiting, diarrhea Tolerating oral intake       Patient has been seen and examined prior to discharge   Objective   Blood pressure (!) 91/59, pulse 84, temperature 97.8 F (36.6 C), temperature source Oral, resp. rate 18, height _0  (1.727 m), weight 63.6 kg, SpO2 100 %.   Intake/Output Summary (Last 24 hours) at 07/16/2021 1712 Last data filed at 07/16/2021 1500 Gross per 24 hour  Intake 1222.95 ml  Output 851 ml  Net 371.95 ml    Exam Gen:- Awake Alert, no acute distress  HEENT:- [place].AT, No sclera icterus Neck-Supple Neck,No JVD,.  Lungs-  CTAB , good air movement bilaterally  CV- S1, S2 normal, regular Abd-  +ve B.Sounds, Abd Soft, No tenderness,    Extremity/Skin:- No  edema,   good pulses Psych-affect is appropriate, oriented x3 Neuro-residual left-sided weakness, as per her sister at bedside patient's neuro status appears to be back to baseline , no additional new focal deficits, no tremors    Data Review   CBC w Diff:  Lab Results  Component Value Date   WBC 4.9 07/16/2021   HGB 8.8 (L) 07/16/2021   HCT 26.2 (L) 07/16/2021   PLT 191 07/16/2021  LYMPHOPCT 6 07/09/2021   MONOPCT 4 07/09/2021   EOSPCT 0 07/09/2021   BASOPCT 0 07/09/2021   CMP:  Lab Results  Component Value Date   NA  132 (L) 07/16/2021   K 3.6 07/16/2021   CL 108 07/16/2021   CO2 16 (L) 07/16/2021   BUN 7 (L) 07/16/2021   CREATININE 0.64 07/16/2021   CREATININE 1.11 (H) 09/17/2020   PROT 5.2 (L) 07/16/2021   ALBUMIN 1.9 (L) 07/16/2021   BILITOT 0.3 07/16/2021   ALKPHOS 81 07/16/2021   AST 27 07/16/2021   ALT 18 07/16/2021    Total Discharge time is about 33 minutes  Roxan Hockey M.D on 07/16/2021 at 5:12 PM  Go to [email or web address] -  for contact info  [facility] - Office  [phone]

## 2021-07-16 NOTE — Discharge Instructions (Signed)
1) complete abstinence from alcohol advised 2) please note that there has been several changes to your medications 3) repeat CBC and CMP blood test on Monday, 07/20/2021 advised 4) follow-up with gastroenterologist in 3 weeks advised--Follow up with Dr. Jenetta Downer-- address [address], [address], [place] Number [phone]

## 2021-07-17 LAB — HIV ANTIBODY (ROUTINE TESTING W REFLEX): HIV Screen 4th Generation wRfx: NONREACTIVE

## 2021-07-18 DIAGNOSIS — I10 Essential (primary) hypertension: Secondary | ICD-10-CM | POA: Diagnosis not present

## 2021-07-18 DIAGNOSIS — K3189 Other diseases of stomach and duodenum: Secondary | ICD-10-CM | POA: Diagnosis not present

## 2021-07-18 DIAGNOSIS — K219 Gastro-esophageal reflux disease without esophagitis: Secondary | ICD-10-CM | POA: Diagnosis not present

## 2021-07-18 DIAGNOSIS — I69354 Hemiplegia and hemiparesis following cerebral infarction affecting left non-dominant side: Secondary | ICD-10-CM | POA: Diagnosis not present

## 2021-07-18 DIAGNOSIS — J45909 Unspecified asthma, uncomplicated: Secondary | ICD-10-CM | POA: Diagnosis not present

## 2021-07-18 DIAGNOSIS — M549 Dorsalgia, unspecified: Secondary | ICD-10-CM | POA: Diagnosis not present

## 2021-07-18 DIAGNOSIS — R531 Weakness: Secondary | ICD-10-CM | POA: Diagnosis not present

## 2021-07-18 DIAGNOSIS — F17210 Nicotine dependence, cigarettes, uncomplicated: Secondary | ICD-10-CM | POA: Diagnosis not present

## 2021-07-18 DIAGNOSIS — K449 Diaphragmatic hernia without obstruction or gangrene: Secondary | ICD-10-CM | POA: Diagnosis not present

## 2021-07-18 DIAGNOSIS — Z79891 Long term (current) use of opiate analgesic: Secondary | ICD-10-CM | POA: Diagnosis not present

## 2021-07-18 DIAGNOSIS — Z96652 Presence of left artificial knee joint: Secondary | ICD-10-CM | POA: Diagnosis not present

## 2021-07-18 DIAGNOSIS — K5909 Other constipation: Secondary | ICD-10-CM | POA: Diagnosis not present

## 2021-07-18 DIAGNOSIS — M797 Fibromyalgia: Secondary | ICD-10-CM | POA: Diagnosis not present

## 2021-07-18 DIAGNOSIS — G8929 Other chronic pain: Secondary | ICD-10-CM | POA: Diagnosis not present

## 2021-07-18 DIAGNOSIS — G9341 Metabolic encephalopathy: Secondary | ICD-10-CM | POA: Diagnosis not present

## 2021-07-18 DIAGNOSIS — M5432 Sciatica, left side: Secondary | ICD-10-CM | POA: Diagnosis not present

## 2021-07-18 LAB — CULTURE, BLOOD (ROUTINE X 2): Culture: NO GROWTH

## 2021-07-20 DIAGNOSIS — F17210 Nicotine dependence, cigarettes, uncomplicated: Secondary | ICD-10-CM | POA: Diagnosis not present

## 2021-07-20 DIAGNOSIS — G8929 Other chronic pain: Secondary | ICD-10-CM | POA: Diagnosis not present

## 2021-07-20 DIAGNOSIS — G9341 Metabolic encephalopathy: Secondary | ICD-10-CM | POA: Diagnosis not present

## 2021-07-20 DIAGNOSIS — K219 Gastro-esophageal reflux disease without esophagitis: Secondary | ICD-10-CM | POA: Diagnosis not present

## 2021-07-20 DIAGNOSIS — K449 Diaphragmatic hernia without obstruction or gangrene: Secondary | ICD-10-CM | POA: Diagnosis not present

## 2021-07-20 DIAGNOSIS — K5909 Other constipation: Secondary | ICD-10-CM | POA: Diagnosis not present

## 2021-07-20 DIAGNOSIS — Z79891 Long term (current) use of opiate analgesic: Secondary | ICD-10-CM | POA: Diagnosis not present

## 2021-07-20 DIAGNOSIS — I10 Essential (primary) hypertension: Secondary | ICD-10-CM | POA: Diagnosis not present

## 2021-07-20 DIAGNOSIS — Z96652 Presence of left artificial knee joint: Secondary | ICD-10-CM | POA: Diagnosis not present

## 2021-07-20 DIAGNOSIS — K3189 Other diseases of stomach and duodenum: Secondary | ICD-10-CM | POA: Diagnosis not present

## 2021-07-20 DIAGNOSIS — M5432 Sciatica, left side: Secondary | ICD-10-CM | POA: Diagnosis not present

## 2021-07-20 DIAGNOSIS — M549 Dorsalgia, unspecified: Secondary | ICD-10-CM | POA: Diagnosis not present

## 2021-07-20 DIAGNOSIS — M797 Fibromyalgia: Secondary | ICD-10-CM | POA: Diagnosis not present

## 2021-07-20 DIAGNOSIS — R531 Weakness: Secondary | ICD-10-CM | POA: Diagnosis not present

## 2021-07-20 DIAGNOSIS — I69354 Hemiplegia and hemiparesis following cerebral infarction affecting left non-dominant side: Secondary | ICD-10-CM | POA: Diagnosis not present

## 2021-07-20 DIAGNOSIS — J45909 Unspecified asthma, uncomplicated: Secondary | ICD-10-CM | POA: Diagnosis not present

## 2021-07-20 LAB — CULTURE, BLOOD (ROUTINE X 2)
Culture: NO GROWTH
Culture: NO GROWTH
Culture: NO GROWTH
Special Requests: ADEQUATE
Special Requests: ADEQUATE

## 2021-07-21 ENCOUNTER — Other Ambulatory Visit: Payer: Self-pay | Admitting: *Deleted

## 2021-07-21 NOTE — Patient Outreach (Signed)
[facility]) Care Management  [place]  07/21/2021   AUDRYANA HOCKENBERRY March 26, 1956 [id number]    Member admitted to hospital 12/12-12/15 for vomiting given history of alcoholic liver cirrhosis.  Call placed to member, permission granted to speak with sister [employer].  She state member is "alright."  She is currently living with member to help with management of medical conditions and medications.  Confirms that member has home health started, state she is helping member with ADL's.  Conversation cut short as sister state she does not have time to talk.  Unable to complete assessment and discuss patient goals and individualized plan of care.    Encounter Medications:  Outpatient Encounter Medications as of 07/21/2021  Medication Sig   albuterol (VENTOLIN HFA) 108 (90 Base) MCG/ACT inhaler Inhale 2 puffs into the lungs every 6 (six) hours as needed for wheezing or shortness of breath.   allopurinol (ZYLOPRIM) 300 MG tablet Take 300 mg by mouth daily.   atenolol (TENORMIN) 25 MG tablet Take 1 tablet (25 mg total) by mouth 2 (two) times daily.   Cholecalciferol (DIALYVITE VITAMIN D3 MAX) 1.25 MG (50000 UT) TABS Take 1 tablet by mouth every 7 (seven) days.   cyclobenzaprine (FLEXERIL) 5 MG tablet Take 5 mg by mouth 3 (three) times daily.   fluconazole (DIFLUCAN) 200 MG tablet Take 1 tablet (200 mg total) by mouth daily for 21 days.   folic acid (FOLVITE) 1 MG tablet Take 1 tablet (1 mg total) by mouth daily.   gabapentin (NEURONTIN) 100 MG capsule Take 1 capsule (100 mg total) by mouth 3 (three) times daily. (Patient taking differently: Take 100 mg by mouth daily.)   HYDROcodone-acetaminophen (NORCO/VICODIN) 5-325 MG tablet Take 1 tablet by mouth every 6 (six) hours as needed.   hydrOXYzine (ATARAX) 10 MG tablet Take 1 tablet (10 mg total) by mouth daily as needed.   lactulose (CHRONULAC) 10 GM/15ML solution Take 30 mLs (20 g total) by mouth 2 (two) times daily as needed for  mild constipation or moderate constipation.   megestrol (MEGACE) 40 MG tablet Take 1 tablet (40 mg total) by mouth daily.   mupirocin ointment (BACTROBAN) 2 % Apply topically 2 (two) times daily.   naloxone (NARCAN) nasal spray 4 mg/0.1 mL See admin instructions. ADMINISTER A SINGLE SPRAYEOF NARCAN INTO ONECNOSTRIL. REPEAT AFTER 3SMINUTES IN OTHER NOSTRIL IF NO RESPONSE.   ondansetron (ZOFRAN) 4 MG tablet Take 1 tablet (4 mg total) by mouth every 6 (six) hours as needed for nausea.   pantoprazole (PROTONIX) 40 MG tablet Take 1 tablet (40 mg total) by mouth 2 (two) times daily.   Plecanatide (TRULANCE) 3 MG TABS Take 3 mg by mouth daily. (Patient not taking: Reported on 07/09/2021)   potassium chloride (MICRO-K) 10 MEQ CR capsule Take 20 mEq by mouth daily.   prednisoLONE acetate (PRED FORTE) 1 % ophthalmic suspension Place 1 drop into both eyes 2 (two) times daily. (Patient not taking: Reported on 07/13/2021)   sucralfate (CARAFATE) 1 g tablet Take 1 tablet (1 g total) by mouth 4 (four) times daily -  with meals and at bedtime for 7 days.   thiamine 100 MG tablet Take 1 tablet (100 mg total) by mouth daily.   No facility-administered encounter medications on file as of 07/21/2021.    Functional Status:  In your present state of health, do you have any difficulty performing the following activities: 07/13/2021 05/18/2021  Hearing? N N  Vision? N N  Difficulty concentrating  or making decisions? N N  Walking or climbing stairs? Y Y  Dressing or bathing? Y N  Doing errands, shopping? Y N  Some recent data might be hidden    Fall/Depression Screening: Fall Risk  05/28/2021 12/29/2017  Falls in the past year? 1 No  Number falls in past yr: 1 -  Injury with Fall? 0 -  Risk for fall due to : History of fall(s) -  Follow up Falls evaluation completed -   PHQ 2/9 Scores 05/28/2021 12/29/2017 04/22/2016  PHQ - 2 Score 0 0 0     Plan:  Follow-up: Patient agrees to Care Plan and  Follow-up. Follow-up in 1 week(s) in effort to complete initial assessment and discuss/develop patient specific goals and plan of care.   Valente David, RN, MSN, [place] Manager [phone]

## 2021-07-22 DIAGNOSIS — M797 Fibromyalgia: Secondary | ICD-10-CM | POA: Diagnosis not present

## 2021-07-22 DIAGNOSIS — K3189 Other diseases of stomach and duodenum: Secondary | ICD-10-CM | POA: Diagnosis not present

## 2021-07-22 DIAGNOSIS — K219 Gastro-esophageal reflux disease without esophagitis: Secondary | ICD-10-CM | POA: Diagnosis not present

## 2021-07-22 DIAGNOSIS — M549 Dorsalgia, unspecified: Secondary | ICD-10-CM | POA: Diagnosis not present

## 2021-07-22 DIAGNOSIS — F17210 Nicotine dependence, cigarettes, uncomplicated: Secondary | ICD-10-CM | POA: Diagnosis not present

## 2021-07-22 DIAGNOSIS — I10 Essential (primary) hypertension: Secondary | ICD-10-CM | POA: Diagnosis not present

## 2021-07-22 DIAGNOSIS — Z96652 Presence of left artificial knee joint: Secondary | ICD-10-CM | POA: Diagnosis not present

## 2021-07-22 DIAGNOSIS — Z79891 Long term (current) use of opiate analgesic: Secondary | ICD-10-CM | POA: Diagnosis not present

## 2021-07-22 DIAGNOSIS — R531 Weakness: Secondary | ICD-10-CM | POA: Diagnosis not present

## 2021-07-22 DIAGNOSIS — J45909 Unspecified asthma, uncomplicated: Secondary | ICD-10-CM | POA: Diagnosis not present

## 2021-07-22 DIAGNOSIS — K5909 Other constipation: Secondary | ICD-10-CM | POA: Diagnosis not present

## 2021-07-22 DIAGNOSIS — K449 Diaphragmatic hernia without obstruction or gangrene: Secondary | ICD-10-CM | POA: Diagnosis not present

## 2021-07-22 DIAGNOSIS — G9341 Metabolic encephalopathy: Secondary | ICD-10-CM | POA: Diagnosis not present

## 2021-07-22 DIAGNOSIS — I69354 Hemiplegia and hemiparesis following cerebral infarction affecting left non-dominant side: Secondary | ICD-10-CM | POA: Diagnosis not present

## 2021-07-22 DIAGNOSIS — M5432 Sciatica, left side: Secondary | ICD-10-CM | POA: Diagnosis not present

## 2021-07-22 DIAGNOSIS — G8929 Other chronic pain: Secondary | ICD-10-CM | POA: Diagnosis not present

## 2021-07-28 ENCOUNTER — Other Ambulatory Visit: Payer: Self-pay | Admitting: *Deleted

## 2021-07-28 ENCOUNTER — Encounter: Payer: Self-pay | Admitting: *Deleted

## 2021-07-28 DIAGNOSIS — Z79891 Long term (current) use of opiate analgesic: Secondary | ICD-10-CM | POA: Diagnosis not present

## 2021-07-28 DIAGNOSIS — K449 Diaphragmatic hernia without obstruction or gangrene: Secondary | ICD-10-CM | POA: Diagnosis not present

## 2021-07-28 DIAGNOSIS — I10 Essential (primary) hypertension: Secondary | ICD-10-CM | POA: Diagnosis not present

## 2021-07-28 DIAGNOSIS — K219 Gastro-esophageal reflux disease without esophagitis: Secondary | ICD-10-CM | POA: Diagnosis not present

## 2021-07-28 DIAGNOSIS — G9341 Metabolic encephalopathy: Secondary | ICD-10-CM | POA: Diagnosis not present

## 2021-07-28 DIAGNOSIS — J45909 Unspecified asthma, uncomplicated: Secondary | ICD-10-CM | POA: Diagnosis not present

## 2021-07-28 DIAGNOSIS — K5909 Other constipation: Secondary | ICD-10-CM | POA: Diagnosis not present

## 2021-07-28 DIAGNOSIS — F17210 Nicotine dependence, cigarettes, uncomplicated: Secondary | ICD-10-CM | POA: Diagnosis not present

## 2021-07-28 DIAGNOSIS — R531 Weakness: Secondary | ICD-10-CM | POA: Diagnosis not present

## 2021-07-28 DIAGNOSIS — Z96652 Presence of left artificial knee joint: Secondary | ICD-10-CM | POA: Diagnosis not present

## 2021-07-28 DIAGNOSIS — M797 Fibromyalgia: Secondary | ICD-10-CM | POA: Diagnosis not present

## 2021-07-28 DIAGNOSIS — K3189 Other diseases of stomach and duodenum: Secondary | ICD-10-CM | POA: Diagnosis not present

## 2021-07-28 DIAGNOSIS — M549 Dorsalgia, unspecified: Secondary | ICD-10-CM | POA: Diagnosis not present

## 2021-07-28 DIAGNOSIS — I69354 Hemiplegia and hemiparesis following cerebral infarction affecting left non-dominant side: Secondary | ICD-10-CM | POA: Diagnosis not present

## 2021-07-28 DIAGNOSIS — M5432 Sciatica, left side: Secondary | ICD-10-CM | POA: Diagnosis not present

## 2021-07-28 DIAGNOSIS — G8929 Other chronic pain: Secondary | ICD-10-CM | POA: Diagnosis not present

## 2021-07-28 NOTE — Patient Outreach (Signed)
[facility] [facility]) Care Management  [place]  07/28/2021   Colleen Lee [birth year] [id number]   Outgoing call placed to member, successful.  Denies any urgent concerns, encouraged to contact this care manager with questions.    Encounter Medications:  Outpatient Encounter Medications as of 07/28/2021  Medication Sig   albuterol (VENTOLIN HFA) 108 (90 Base) MCG/ACT inhaler Inhale 2 puffs into the lungs every 6 (six) hours as needed for wheezing or shortness of breath.   allopurinol (ZYLOPRIM) 300 MG tablet Take 300 mg by mouth daily.   atenolol (TENORMIN) 25 MG tablet Take 1 tablet (25 mg total) by mouth 2 (two) times daily.   Cholecalciferol (DIALYVITE VITAMIN D3 MAX) 1.25 MG (50000 UT) TABS Take 1 tablet by mouth every 7 (seven) days.   cyclobenzaprine (FLEXERIL) 5 MG tablet Take 5 mg by mouth 3 (three) times daily.   fluconazole (DIFLUCAN) 200 MG tablet Take 1 tablet (200 mg total) by mouth daily for 21 days.   folic acid (FOLVITE) 1 MG tablet Take 1 tablet (1 mg total) by mouth daily.   gabapentin (NEURONTIN) 100 MG capsule Take 1 capsule (100 mg total) by mouth 3 (three) times daily. (Patient taking differently: Take 100 mg by mouth daily.)   HYDROcodone-acetaminophen (NORCO/VICODIN) 5-325 MG tablet Take 1 tablet by mouth every 6 (six) hours as needed.   hydrOXYzine (ATARAX) 10 MG tablet Take 1 tablet (10 mg total) by mouth daily as needed.   lactulose (CHRONULAC) 10 GM/15ML solution Take 30 mLs (20 g total) by mouth 2 (two) times daily as needed for mild constipation or moderate constipation.   megestrol (MEGACE) 40 MG tablet Take 1 tablet (40 mg total) by mouth daily.   mupirocin ointment (BACTROBAN) 2 % Apply topically 2 (two) times daily.   naloxone (NARCAN) nasal spray 4 mg/0.1 mL See admin instructions. ADMINISTER A SINGLE SPRAYEOF NARCAN INTO ONECNOSTRIL. REPEAT AFTER 3SMINUTES IN OTHER NOSTRIL IF NO RESPONSE.   ondansetron (ZOFRAN) 4 MG tablet Take 1  tablet (4 mg total) by mouth every 6 (six) hours as needed for nausea.   pantoprazole (PROTONIX) 40 MG tablet Take 1 tablet (40 mg total) by mouth 2 (two) times daily.   Plecanatide (TRULANCE) 3 MG TABS Take 3 mg by mouth daily. (Patient not taking: Reported on 07/09/2021)   potassium chloride (MICRO-K) 10 MEQ CR capsule Take 20 mEq by mouth daily.   prednisoLONE acetate (PRED FORTE) 1 % ophthalmic suspension Place 1 drop into both eyes 2 (two) times daily. (Patient not taking: Reported on 07/13/2021)   sucralfate (CARAFATE) 1 g tablet Take 1 tablet (1 g total) by mouth 4 (four) times daily -  with meals and at bedtime for 7 days.   thiamine 100 MG tablet Take 1 tablet (100 mg total) by mouth daily.   No facility-administered encounter medications on file as of 07/28/2021.    Functional Status:  In your present state of health, do you have any difficulty performing the following activities: 07/13/2021 05/18/2021  Hearing? N N  Vision? N N  Difficulty concentrating or making decisions? N N  Walking or climbing stairs? Y Y  Dressing or bathing? Y N  Doing errands, shopping? Y N  Some recent data might be hidden    Fall/Depression Screening: Fall Risk  05/28/2021 12/29/2017  Falls in the past year? 1 No  Number falls in past yr: 1 -  Injury with Fall? 0 -  Risk for fall due to : History of  fall(s) -  Follow up Falls evaluation completed -   PHQ 2/9 Scores 05/28/2021 12/29/2017 04/22/2016  PHQ - 2 Score 0 0 0    Assessment:   Care Plan Care Plan : [place] (Adult)  Updates made by Valente David, RN since 07/28/2021 12:00 AM     Problem: Knowledge deficit regarding management of chronic medical condition (HTN, Pancreatitis, and liver cirrhosis)   Priority: High     Long-Range Goal: Member will be able to adequately manage chronic medical conditions within the next 6 months   Start Date: 07/28/2021  Expected End Date: 01/24/2022  Priority: High  Note:   Current  Barriers:  Knowledge Deficits related to plan of care for management of HTN and pancreatitis/liver cirrhosis  Care Coordination needs related to ADL IADL limitations   RNCM Clinical Goal(s):  Patient will verbalize understanding of plan for management of HTN and pancreatitis/liver cirrhosis as evidenced by member/family verbalizing adequate plan of care for medical conditions take all medications exactly as prescribed and will call provider for medication related questions as evidenced by Member reported adherence    demonstrate understanding of rationale for each prescribed medication as evidenced by Member reported compliance with medications    continue to work with RN Care Manager and/or Social Worker to address care management and care coordination needs related to HTN and pancreatitis/liver cirrhosis as evidenced by adherence to CM Team Scheduled appointments     work with Home health to increase strength and independence as evidenced by ability to independently perform ADLs  through collaboration with Consulting civil engineer, provider, and care team.   Interventions: Inter-disciplinary care team collaboration (see longitudinal plan of care) Evaluation of current treatment plan related to  self management and patient's adherence to plan as established by provider   Pancreatitis/Liver Cirrhosis  (Status: New goal.) Long Term Goal  Evaluation of current treatment plan related to  Pancreatitis/liver cirrhosis , ADL IADL limitations self-management and patient's adherence to plan as established by provider. Discussed plans with patient for ongoing care management follow up and provided patient with direct contact information for care management team Reviewed medications with patient and discussed importance of adherence; Reviewed scheduled/upcoming provider appointments including GI and PCP;  Hypertension: (Status: New goal.) Short Term Goal  Last practice recorded BP readings:  BP Readings from  Last 3 Encounters:  07/16/21 (!) 91/59  07/09/21 (!) 145/102  06/30/21 124/83  Most recent eGFR/CrCl: No results found for: EGFR  No components found for: CRCL  Reviewed medications with patient and discussed importance of compliance;  Discussed plans with patient for ongoing care management follow up and provided patient with direct contact information for care management team; Advised patient, providing education and rationale, to monitor blood pressure daily and record, calling PCP for findings outside established parameters;   Patient Goals/Self-Care Activities: Take medications as prescribed   Attend all scheduled provider appointments Perform all self care activities independently  check blood pressure daily choose a place to take my blood pressure (home, clinic or office, retail store) keep a blood pressure log   Update 12/27 - Member report she has been having the help of her sister, husband, and neighbor.  State she has been having diarrhea today, denies any nausea and vomiting.  Receiving home health, nurse will be there today, also working with PT.  Still waiting on wheelchair that was ordered through home health, advised to inquire about it during visit today.  Follow up appointment on 12/30, sister will  provide transportation.  Appointment with GI on 2/2.          Plan:  Follow-up: Patient agrees to Care Plan and Follow-up. Follow-up in 1 month(s)  Valente David, RN, MSN, [place] [phone]

## 2021-07-30 DIAGNOSIS — I10 Essential (primary) hypertension: Secondary | ICD-10-CM | POA: Diagnosis not present

## 2021-07-30 DIAGNOSIS — Z79891 Long term (current) use of opiate analgesic: Secondary | ICD-10-CM | POA: Diagnosis not present

## 2021-07-30 DIAGNOSIS — R531 Weakness: Secondary | ICD-10-CM | POA: Diagnosis not present

## 2021-07-30 DIAGNOSIS — M797 Fibromyalgia: Secondary | ICD-10-CM | POA: Diagnosis not present

## 2021-07-30 DIAGNOSIS — K449 Diaphragmatic hernia without obstruction or gangrene: Secondary | ICD-10-CM | POA: Diagnosis not present

## 2021-07-30 DIAGNOSIS — K219 Gastro-esophageal reflux disease without esophagitis: Secondary | ICD-10-CM | POA: Diagnosis not present

## 2021-07-30 DIAGNOSIS — K3189 Other diseases of stomach and duodenum: Secondary | ICD-10-CM | POA: Diagnosis not present

## 2021-07-30 DIAGNOSIS — M549 Dorsalgia, unspecified: Secondary | ICD-10-CM | POA: Diagnosis not present

## 2021-07-30 DIAGNOSIS — I69354 Hemiplegia and hemiparesis following cerebral infarction affecting left non-dominant side: Secondary | ICD-10-CM | POA: Diagnosis not present

## 2021-07-30 DIAGNOSIS — M5432 Sciatica, left side: Secondary | ICD-10-CM | POA: Diagnosis not present

## 2021-07-30 DIAGNOSIS — G8929 Other chronic pain: Secondary | ICD-10-CM | POA: Diagnosis not present

## 2021-07-30 DIAGNOSIS — F17210 Nicotine dependence, cigarettes, uncomplicated: Secondary | ICD-10-CM | POA: Diagnosis not present

## 2021-07-30 DIAGNOSIS — Z96652 Presence of left artificial knee joint: Secondary | ICD-10-CM | POA: Diagnosis not present

## 2021-07-30 DIAGNOSIS — K5909 Other constipation: Secondary | ICD-10-CM | POA: Diagnosis not present

## 2021-07-30 DIAGNOSIS — G9341 Metabolic encephalopathy: Secondary | ICD-10-CM | POA: Diagnosis not present

## 2021-07-30 DIAGNOSIS — J45909 Unspecified asthma, uncomplicated: Secondary | ICD-10-CM | POA: Diagnosis not present

## 2021-07-31 DIAGNOSIS — Z6822 Body mass index (BMI) 22.0-22.9, adult: Secondary | ICD-10-CM | POA: Diagnosis not present

## 2021-07-31 DIAGNOSIS — M5090 Cervical disc disorder, unspecified, unspecified cervical region: Secondary | ICD-10-CM | POA: Diagnosis not present

## 2021-07-31 DIAGNOSIS — Z79899 Other long term (current) drug therapy: Secondary | ICD-10-CM | POA: Diagnosis not present

## 2021-07-31 DIAGNOSIS — I639 Cerebral infarction, unspecified: Secondary | ICD-10-CM | POA: Diagnosis not present

## 2021-07-31 DIAGNOSIS — R030 Elevated blood-pressure reading, without diagnosis of hypertension: Secondary | ICD-10-CM | POA: Diagnosis not present

## 2021-07-31 DIAGNOSIS — Z1211 Encounter for screening for malignant neoplasm of colon: Secondary | ICD-10-CM | POA: Diagnosis not present

## 2021-08-04 ENCOUNTER — Encounter (HOSPITAL_COMMUNITY): Payer: Self-pay

## 2021-08-04 ENCOUNTER — Emergency Department (HOSPITAL_COMMUNITY): Payer: Medicare Other

## 2021-08-04 ENCOUNTER — Other Ambulatory Visit: Payer: Self-pay

## 2021-08-04 ENCOUNTER — Emergency Department (HOSPITAL_COMMUNITY)
Admission: EM | Admit: 2021-08-04 | Discharge: 2021-08-04 | Disposition: A | Discharge status: Home / Self Care | Payer: Medicare Other | Attending: Emergency Medicine | Admitting: Emergency Medicine

## 2021-08-04 DIAGNOSIS — M549 Dorsalgia, unspecified: Secondary | ICD-10-CM | POA: Diagnosis not present

## 2021-08-04 DIAGNOSIS — I499 Cardiac arrhythmia, unspecified: Secondary | ICD-10-CM | POA: Diagnosis not present

## 2021-08-04 DIAGNOSIS — R4701 Aphasia: Secondary | ICD-10-CM | POA: Insufficient documentation

## 2021-08-04 DIAGNOSIS — R6889 Other general symptoms and signs: Secondary | ICD-10-CM | POA: Diagnosis not present

## 2021-08-04 DIAGNOSIS — Z79891 Long term (current) use of opiate analgesic: Secondary | ICD-10-CM | POA: Diagnosis not present

## 2021-08-04 DIAGNOSIS — Z20822 Contact with and (suspected) exposure to covid-19: Secondary | ICD-10-CM | POA: Diagnosis not present

## 2021-08-04 DIAGNOSIS — J45909 Unspecified asthma, uncomplicated: Secondary | ICD-10-CM | POA: Diagnosis not present

## 2021-08-04 DIAGNOSIS — I69354 Hemiplegia and hemiparesis following cerebral infarction affecting left non-dominant side: Secondary | ICD-10-CM | POA: Diagnosis not present

## 2021-08-04 DIAGNOSIS — R531 Weakness: Secondary | ICD-10-CM | POA: Diagnosis not present

## 2021-08-04 DIAGNOSIS — K219 Gastro-esophageal reflux disease without esophagitis: Secondary | ICD-10-CM | POA: Diagnosis not present

## 2021-08-04 DIAGNOSIS — M797 Fibromyalgia: Secondary | ICD-10-CM | POA: Diagnosis not present

## 2021-08-04 DIAGNOSIS — Z96652 Presence of left artificial knee joint: Secondary | ICD-10-CM | POA: Diagnosis not present

## 2021-08-04 DIAGNOSIS — G8929 Other chronic pain: Secondary | ICD-10-CM | POA: Diagnosis not present

## 2021-08-04 DIAGNOSIS — K5909 Other constipation: Secondary | ICD-10-CM | POA: Diagnosis not present

## 2021-08-04 DIAGNOSIS — Z79899 Other long term (current) drug therapy: Secondary | ICD-10-CM | POA: Diagnosis not present

## 2021-08-04 DIAGNOSIS — R4781 Slurred speech: Secondary | ICD-10-CM | POA: Diagnosis not present

## 2021-08-04 DIAGNOSIS — I10 Essential (primary) hypertension: Secondary | ICD-10-CM | POA: Diagnosis not present

## 2021-08-04 DIAGNOSIS — G9341 Metabolic encephalopathy: Secondary | ICD-10-CM | POA: Diagnosis not present

## 2021-08-04 DIAGNOSIS — R000 Tachycardia, unspecified: Secondary | ICD-10-CM | POA: Diagnosis not present

## 2021-08-04 DIAGNOSIS — K449 Diaphragmatic hernia without obstruction or gangrene: Secondary | ICD-10-CM | POA: Diagnosis not present

## 2021-08-04 DIAGNOSIS — F17210 Nicotine dependence, cigarettes, uncomplicated: Secondary | ICD-10-CM | POA: Diagnosis not present

## 2021-08-04 DIAGNOSIS — Z743 Need for continuous supervision: Secondary | ICD-10-CM | POA: Diagnosis not present

## 2021-08-04 DIAGNOSIS — K3189 Other diseases of stomach and duodenum: Secondary | ICD-10-CM | POA: Diagnosis not present

## 2021-08-04 DIAGNOSIS — M5432 Sciatica, left side: Secondary | ICD-10-CM | POA: Diagnosis not present

## 2021-08-04 LAB — URINALYSIS, ROUTINE W REFLEX MICROSCOPIC
Bacteria, UA: NONE SEEN
Bilirubin Urine: NEGATIVE
Glucose, UA: NEGATIVE mg/dL
Hgb urine dipstick: NEGATIVE
Ketones, ur: NEGATIVE mg/dL
Leukocytes,Ua: NEGATIVE
Nitrite: NEGATIVE
Protein, ur: 30 mg/dL — AB
Specific Gravity, Urine: 1.021 (ref 1.005–1.030)
pH: 5 (ref 5.0–8.0)

## 2021-08-04 LAB — RESP PANEL BY RT-PCR (FLU A&B, COVID) ARPGX2
Influenza A by PCR: NEGATIVE
Influenza B by PCR: NEGATIVE
SARS Coronavirus 2 by RT PCR: NEGATIVE

## 2021-08-04 LAB — COMPREHENSIVE METABOLIC PANEL
ALT: 19 U/L (ref 0–44)
AST: 33 U/L (ref 15–41)
Albumin: 2.2 g/dL — ABNORMAL LOW (ref 3.5–5.0)
Alkaline Phosphatase: 215 U/L — ABNORMAL HIGH (ref 38–126)
Anion gap: 10 (ref 5–15)
BUN: 7 mg/dL — ABNORMAL LOW (ref 8–23)
CO2: 16 mmol/L — ABNORMAL LOW (ref 22–32)
Calcium: 10.3 mg/dL (ref 8.9–10.3)
Chloride: 113 mmol/L — ABNORMAL HIGH (ref 98–111)
Creatinine, Ser: 0.77 mg/dL (ref 0.44–1.00)
GFR, Estimated: >60 mL/min (ref >60)
Glucose, Bld: 105 mg/dL — ABNORMAL HIGH (ref 70–99)
Potassium: 3.9 mmol/L (ref 3.5–5.1)
Sodium: 139 mmol/L (ref 135–145)
Total Bilirubin: 0.9 mg/dL (ref 0.3–1.2)
Total Protein: 6.8 g/dL (ref 6.5–8.1)

## 2021-08-04 LAB — CBC WITH DIFFERENTIAL/PLATELET
Abs Immature Granulocytes: 0.06 10*3/uL (ref 0.00–0.07)
Basophils Absolute: 0 10*3/uL (ref 0.0–0.1)
Basophils Relative: 0 %
Eosinophils Absolute: 0.1 10*3/uL (ref 0.0–0.5)
Eosinophils Relative: 1 %
HCT: 28.1 % — ABNORMAL LOW (ref 36.0–46.0)
Hemoglobin: 9.5 g/dL — ABNORMAL LOW (ref 12.0–15.0)
Immature Granulocytes: 1 %
Lymphocytes Relative: 16 %
Lymphs Abs: 1.3 10*3/uL (ref 0.7–4.0)
MCH: 34.9 pg — ABNORMAL HIGH (ref 26.0–34.0)
MCHC: 33.8 g/dL (ref 30.0–36.0)
MCV: 103.3 fL — ABNORMAL HIGH (ref 80.0–100.0)
Monocytes Absolute: 0.6 10*3/uL (ref 0.1–1.0)
Monocytes Relative: 7 %
Neutro Abs: 6.1 10*3/uL (ref 1.7–7.7)
Neutrophils Relative %: 75 %
Platelets: 269 10*3/uL (ref 150–400)
RBC: 2.72 MIL/uL — ABNORMAL LOW (ref 3.87–5.11)
RDW: 17.7 % — ABNORMAL HIGH (ref 11.5–15.5)
WBC: 8.1 10*3/uL (ref 4.0–10.5)
nRBC: 0 % (ref 0.0–0.2)

## 2021-08-04 LAB — CBG MONITORING, ED: Glucose-Capillary: 86 mg/dL (ref 70–99)

## 2021-08-04 LAB — AMMONIA: Ammonia: 17 umol/L (ref 9–35)

## 2021-08-04 LAB — ETHANOL: Alcohol, Ethyl (B): <10 mg/dL (ref <10)

## 2021-08-04 LAB — LIPASE, BLOOD: Lipase: 18 U/L (ref 11–51)

## 2021-08-04 NOTE — ED Notes (Signed)
Pt returned from CT °

## 2021-08-04 NOTE — ED Provider Notes (Signed)
[facility] Provider Note   CSN: [id number] Arrival date & time: 08/04/21  1056     History  Chief Complaint  Patient presents with   Aphasia   Weakness    Colleen Lee is a 66 y.o. female.  She is brought in from home by EMS for possible worsened garbled speech and weakness.  History given by EMS patient and patient's brother who helps care for her.  He said her speech has been more slurred over the past few days.  She is also been more shaky on her feet and sometimes complains of some numbness in her left hand.  He has a said she has been seeing people who are not there for the last few nights.  She is diagnosed with a possible stroke around a month ago and has been declining since then.  Follows at pain clinic for her liver disease and is on narcotic pain medicine.  No recent alcohol use.  No recent falls.  Patient denies headache blurry vision double vision cough chest pain.  Sometimes has abdominal pain.  The history is provided by the patient, the EMS personnel and a relative.  Cerebrovascular Accident This is a new problem. The current episode started more than 2 days ago. The problem occurs daily. The problem has not changed since onset.Associated symptoms include abdominal pain. Pertinent negatives include no chest pain, no headaches and no shortness of breath. The symptoms are aggravated by walking and standing. Nothing relieves the symptoms. She has tried rest for the symptoms. The treatment provided no relief.      Home Medications Prior to Admission medications   Medication Sig Start Date End Date Taking? Authorizing Provider  albuterol (VENTOLIN HFA) 108 (90 Base) MCG/ACT inhaler Inhale 2 puffs into the lungs every 6 (six) hours as needed for wheezing or shortness of breath. 07/16/21   Roxan Hockey, MD  allopurinol (ZYLOPRIM) 300 MG tablet Take 300 mg by mouth daily. 04/15/21   [provider]  atenolol (TENORMIN) 25 MG tablet Take 1 tablet  (25 mg total) by mouth 2 (two) times daily. 07/16/21 07/16/22  Roxan Hockey, MD  Cholecalciferol (DIALYVITE VITAMIN D3 MAX) 1.25 MG (50000 UT) TABS Take 1 tablet by mouth every 7 (seven) days.    [provider]  cyclobenzaprine (FLEXERIL) 5 MG tablet Take 5 mg by mouth 3 (three) times daily. 04/15/21   [provider]  fluconazole (DIFLUCAN) 200 MG tablet Take 1 tablet (200 mg total) by mouth daily for 21 days. 07/16/21 08/06/21  Roxan Hockey, MD  folic acid (FOLVITE) 1 MG tablet Take 1 tablet (1 mg total) by mouth daily. 01/06/21   Barton Dubois, MD  gabapentin (NEURONTIN) 100 MG capsule Take 1 capsule (100 mg total) by mouth 3 (three) times daily. Patient taking differently: Take 100 mg by mouth daily. 12/22/18   Carole Civil, MD  HYDROcodone-acetaminophen (NORCO/VICODIN) 5-325 MG tablet Take 1 tablet by mouth every 6 (six) hours as needed. 06/08/21   Noemi Chapel, MD  hydrOXYzine (ATARAX) 10 MG tablet Take 1 tablet (10 mg total) by mouth daily as needed. 07/16/21   Roxan Hockey, MD  lactulose (CHRONULAC) 10 GM/15ML solution Take 30 mLs (20 g total) by mouth 2 (two) times daily as needed for mild constipation or moderate constipation. 07/16/21   Roxan Hockey, MD  megestrol (MEGACE) 40 MG tablet Take 1 tablet (40 mg total) by mouth daily. 07/16/21   Roxan Hockey, MD  mupirocin ointment (BACTROBAN) 2 %  Apply topically 2 (two) times daily. 06/18/21   [provider]  naloxone [facility]) nasal spray 4 mg/0.1 mL See admin instructions. ADMINISTER A SINGLE SPRAYEOF NARCAN INTO ONECNOSTRIL. REPEAT AFTER 3SMINUTES IN OTHER NOSTRIL IF NO RESPONSE. 10/03/20   [provider]  ondansetron (ZOFRAN) 4 MG tablet Take 1 tablet (4 mg total) by mouth every 6 (six) hours as needed for nausea. 07/16/21   Roxan Hockey, MD  pantoprazole (PROTONIX) 40 MG tablet Take 1 tablet (40 mg total) by mouth 2 (two) times daily. 07/16/21 07/16/22  Roxan Hockey, MD   Plecanatide (TRULANCE) 3 MG TABS Take 3 mg by mouth daily. Patient not taking: Reported on 07/09/2021 06/30/21 06/30/22  Daneil Dolin, MD  potassium chloride (MICRO-K) 10 MEQ CR capsule Take 20 mEq by mouth daily. 05/30/21   [provider]  prednisoLONE acetate (PRED FORTE) 1 % ophthalmic suspension Place 1 drop into both eyes 2 (two) times daily. Patient not taking: Reported on 07/13/2021 03/10/21   [provider]  sucralfate (CARAFATE) 1 g tablet Take 1 tablet (1 g total) by mouth 4 (four) times daily -  with meals and at bedtime for 7 days. 07/16/21 07/23/21  Roxan Hockey, MD  thiamine 100 MG tablet Take 1 tablet (100 mg total) by mouth daily. 01/06/21   Barton Dubois, MD      Allergies    Penicillins    Review of Systems   Review of Systems  Constitutional:  Negative for fever.  HENT:  Negative for sore throat.   Eyes:  Negative for visual disturbance.  Respiratory:  Negative for shortness of breath.   Cardiovascular:  Negative for chest pain.  Gastrointestinal:  Positive for abdominal pain.  Genitourinary:  Negative for dysuria.  Musculoskeletal:  Positive for gait problem.  Skin:  Negative for rash.  Neurological:  Positive for speech difficulty, weakness and numbness. Negative for headaches.   Physical Exam Updated Vital Signs BP 125/86 (BP Location: Left Arm)    Pulse (!) 108    Temp 98.5 F (36.9 C) (Oral)    Resp 20    Ht 5\' 8"  (1.727 m)    Wt 63.5 kg    SpO2 95%    BMI 21.29 kg/m  Physical Exam Vitals and nursing note reviewed.  Constitutional:      General: She is not in acute distress.    Appearance: Normal appearance. She is well-developed.  HENT:     Head: Normocephalic and atraumatic.  Eyes:     Conjunctiva/sclera: Conjunctivae normal.  Cardiovascular:     Rate and Rhythm: Regular rhythm. Tachycardia present.     Heart sounds: No murmur heard. Pulmonary:     Effort: Pulmonary effort is normal. No respiratory distress.     Breath  sounds: Normal breath sounds.  Abdominal:     Palpations: Abdomen is soft.     Tenderness: There is no abdominal tenderness.  Musculoskeletal:        General: No swelling, deformity or signs of injury. Normal range of motion.     Cervical back: Neck supple.  Skin:    General: Skin is warm and dry.     Capillary Refill: Capillary refill takes less than 2 seconds.  Neurological:     General: No focal deficit present.     Mental Status: She is alert.     Cranial Nerves: No cranial nerve deficit.     Sensory: No sensory deficit.     Motor: No weakness.  Psychiatric:  Mood and Affect: Mood normal.    ED Results / Procedures / Treatments   Labs (all labs ordered are listed, but only abnormal results are displayed) Labs Reviewed  URINALYSIS, ROUTINE W REFLEX MICROSCOPIC - Abnormal; Notable for the following components:      Result Value   Protein, ur 30 (*)    All other components within normal limits  CBC WITH DIFFERENTIAL/PLATELET - Abnormal; Notable for the following components:   RBC 2.72 (*)    Hemoglobin 9.5 (*)    HCT 28.1 (*)    MCV 103.3 (*)    MCH 34.9 (*)    RDW 17.7 (*)    All other components within normal limits  COMPREHENSIVE METABOLIC PANEL - Abnormal; Notable for the following components:   Chloride 113 (*)    CO2 16 (*)    Glucose, Bld 105 (*)    BUN 7 (*)    Albumin 2.2 (*)    Alkaline Phosphatase 215 (*)    All other components within normal limits  RESP PANEL BY RT-PCR (FLU A&B, COVID) ARPGX2  AMMONIA  ETHANOL  LIPASE, BLOOD  CBG MONITORING, ED  CBG MONITORING, ED  CBG MONITORING, ED    EKG EKG Interpretation  Date/Time:  Tuesday August 04 2021 13:30:31 EST Ventricular Rate:  101 PR Interval:  128 QRS Duration: 84 QT Interval:  334 QTC Calculation: 433 R Axis:   80 Text Interpretation: Sinus tachycardia with Premature atrial complexes Nonspecific T wave abnormality Abnormal ECG When compared with ECG of 13-Jul-2021 11:19, No  significant change since last tracing Confirmed by Aletta Edouard [phone]) on 08/04/2021 1:32:46 PM  Radiology CT Head Wo Contrast  Result Date: 08/04/2021 CLINICAL DATA:  Provided history: Neuro deficit, acute, stroke suspected. Mental status change, unknown cause. Slurred speech. EXAM: CT HEAD WITHOUT CONTRAST TECHNIQUE: Contiguous axial images were obtained from the base of the skull through the vertex without intravenous contrast. COMPARISON:  Brain MRI 07/13/2021.  Head CT 05/13/2014. FINDINGS: Brain: Mild generalized cerebral atrophy. Mild patchy and ill-defined hypoattenuation within the cerebral white matter, nonspecific but compatible with chronic small vessel ischemic disease. There is no acute intracranial hemorrhage. No demarcated cortical infarct. No extra-axial fluid collection. No evidence of an intracranial mass. No midline shift. Vascular: No hyperdense vessel.  Atherosclerotic calcifications. Skull: Normal. Negative for fracture or focal lesion. Sinuses/Orbits: Visualized orbits show no acute finding. No significant paranasal sinus disease at the imaged levels. IMPRESSION: No evidence of acute intracranial abnormality. Mild chronic small vessel ischemic changes within the cerebral white matter. Mild generalized cerebral atrophy. Electronically Signed   By: Kellie Simmering D.O.   On: 08/04/2021 14:14    Procedures Procedures    Medications Ordered in ED Medications - No data to display  ED Course/ Medical Decision Making/ A&P                           Medical Decision Making  This patient presents to the ED for concern of slurred speech left-sided weakness, this involves an extensive number of treatment options, and is a complaint that carries with it a high risk of complications and morbidity.  The differential diagnosis includes stroke, bleed, metabolic derangement, stroke recrudescence, UTI, pneumonia   Additional history obtained:  Additional history obtained from patient  brother and sister External records from outside source obtained and reviewed including EMS   Lab Tests:  I Ordered, reviewed, and interpreted labs.  The pertinent results include: CBC  with normal white count, hemoglobin low stable from priors, chemistries with low chloride and low carbon dioxide similar to priors, urinalysis without signs of infection, COVID and flu negative   Imaging Studies ordered:  I ordered imaging studies including head CT I independently visualized and interpreted imaging which showed no acute findings I agree with the radiologist interpretation   Cardiac Monitoring:  The patient was maintained on a cardiac monitor.  I personally viewed and interpreted the cardiac monitored which showed an underlying rhythm of: Sinus tachycardia   Medicines ordered and prescription drug management:  I have reviewed the patients home medicines and have made adjustments as needed   Critical Interventions:  None    Problem List / ED Course: Patient is neurologically intact and appears to be about her baseline.  CT does not show any acute findings.  Last MRI in December did not show any signs of acute stroke.  Currently do not feel needs admission to the hospital for further work-up.  Recommended outpatient follow-up with her treating providers.    Reevaluation:  After the interventions noted above, I reevaluated the patient and found that they have :improved   Dispostion:  After consideration of the diagnostic results and the patients response to treatment feel that the patent would benefit from outpatient follow-up with her PCP and neurology..          Final Clinical Impression(s) / ED Diagnoses Final diagnoses:  Generalized weakness    Rx / DC Orders ED Discharge Orders     None         Hayden Rasmussen, MD 08/04/21 [phone]

## 2021-08-04 NOTE — Discharge Instructions (Addendum)
You were seen in the emergency department for continued weakness and slurred speech.  Your lab work did not show an obvious explanation for your symptoms and your CAT scan did not show any acute findings.  Please follow-up with neurology and your primary care doctor.  Keep well-hydrated.  Return to the emergency department if any worsening or concerning symptoms.

## 2021-08-04 NOTE — ED Notes (Signed)
Attempted IV. Second nurse attempting at this time.

## 2021-08-04 NOTE — ED Notes (Signed)
Second nurse attempted IV access with no success.

## 2021-08-04 NOTE — ED Notes (Signed)
Patient transported to CT 

## 2021-08-04 NOTE — ED Notes (Signed)
Pt tolerating oral fluids at this time.  °

## 2021-08-04 NOTE — ED Triage Notes (Addendum)
Patient via EMS for garbled speech after waking up this morning around 1030. Left leg deficit from previous stroke.

## 2021-08-04 NOTE — ED Notes (Signed)
No IV access at this time. EDP aware.

## 2021-08-04 NOTE — ED Provider Notes (Signed)
°  Provider Note MRN:  [id number]  Arrival date & time: 08/05/21    ED Course and Medical Decision Making  Assumed care from Dr Melina Copa at shift change.  See not from prior team for complete details, in brief: 66 yo female with history of prior CVA brought to the ER for evaluation of changes to speech and weakness.  Onset of symptoms greater than 24 hours ago.  Labs from visit reviewed by prior team.  Stable.  CT imaging stable.  EKG stable.  Plan per prior physician urinalysis, reassess to disposition.  Urinalysis nonacute, no infection.  CT imaging without acute abnormality.  Advised patient and family to follow-up with neurology and primary care doctor as an outpatient.  Return to emergency dept for any worsening worrisome symptoms.  D/c in stable condition.  The patient improved significantly and was discharged in stable condition. Detailed discussions were had with the patient regarding current findings, and need for close f/u with PCP or on call doctor. The patient has been instructed to return immediately if the symptoms worsen in any way for re-evaluation. Patient verbalized understanding and is in agreement with current care plan. All questions answered prior to discharge.    Procedures  Final Clinical Impressions(s) / ED Diagnoses     ICD-10-CM   1. Generalized weakness  R53.1       ED Discharge Orders     None         Discharge Instructions      You were seen in the emergency department for continued weakness and slurred speech.  Your lab work did not show an obvious explanation for your symptoms and your CAT scan did not show any acute findings.  Please follow-up with neurology and your primary care doctor.  Keep well-hydrated.  Return to the emergency department if any worsening or concerning symptoms.        Jeanell Sparrow, DO 08/05/21 0036

## 2021-08-06 DIAGNOSIS — M5432 Sciatica, left side: Secondary | ICD-10-CM | POA: Diagnosis not present

## 2021-08-06 DIAGNOSIS — Z96652 Presence of left artificial knee joint: Secondary | ICD-10-CM | POA: Diagnosis not present

## 2021-08-06 DIAGNOSIS — K3189 Other diseases of stomach and duodenum: Secondary | ICD-10-CM | POA: Diagnosis not present

## 2021-08-06 DIAGNOSIS — I69354 Hemiplegia and hemiparesis following cerebral infarction affecting left non-dominant side: Secondary | ICD-10-CM | POA: Diagnosis not present

## 2021-08-06 DIAGNOSIS — I10 Essential (primary) hypertension: Secondary | ICD-10-CM | POA: Diagnosis not present

## 2021-08-06 DIAGNOSIS — G9341 Metabolic encephalopathy: Secondary | ICD-10-CM | POA: Diagnosis not present

## 2021-08-06 DIAGNOSIS — K219 Gastro-esophageal reflux disease without esophagitis: Secondary | ICD-10-CM | POA: Diagnosis not present

## 2021-08-06 DIAGNOSIS — M549 Dorsalgia, unspecified: Secondary | ICD-10-CM | POA: Diagnosis not present

## 2021-08-06 DIAGNOSIS — Z79891 Long term (current) use of opiate analgesic: Secondary | ICD-10-CM | POA: Diagnosis not present

## 2021-08-06 DIAGNOSIS — R531 Weakness: Secondary | ICD-10-CM | POA: Diagnosis not present

## 2021-08-06 DIAGNOSIS — F17210 Nicotine dependence, cigarettes, uncomplicated: Secondary | ICD-10-CM | POA: Diagnosis not present

## 2021-08-06 DIAGNOSIS — K449 Diaphragmatic hernia without obstruction or gangrene: Secondary | ICD-10-CM | POA: Diagnosis not present

## 2021-08-06 DIAGNOSIS — K5909 Other constipation: Secondary | ICD-10-CM | POA: Diagnosis not present

## 2021-08-06 DIAGNOSIS — M797 Fibromyalgia: Secondary | ICD-10-CM | POA: Diagnosis not present

## 2021-08-06 DIAGNOSIS — G8929 Other chronic pain: Secondary | ICD-10-CM | POA: Diagnosis not present

## 2021-08-06 DIAGNOSIS — J45909 Unspecified asthma, uncomplicated: Secondary | ICD-10-CM | POA: Diagnosis not present

## 2021-08-07 DIAGNOSIS — I69354 Hemiplegia and hemiparesis following cerebral infarction affecting left non-dominant side: Secondary | ICD-10-CM | POA: Diagnosis not present

## 2021-08-07 DIAGNOSIS — G8929 Other chronic pain: Secondary | ICD-10-CM | POA: Diagnosis not present

## 2021-08-07 DIAGNOSIS — M797 Fibromyalgia: Secondary | ICD-10-CM | POA: Diagnosis not present

## 2021-08-07 DIAGNOSIS — J45909 Unspecified asthma, uncomplicated: Secondary | ICD-10-CM | POA: Diagnosis not present

## 2021-08-07 DIAGNOSIS — Z79891 Long term (current) use of opiate analgesic: Secondary | ICD-10-CM | POA: Diagnosis not present

## 2021-08-07 DIAGNOSIS — M5432 Sciatica, left side: Secondary | ICD-10-CM | POA: Diagnosis not present

## 2021-08-07 DIAGNOSIS — I10 Essential (primary) hypertension: Secondary | ICD-10-CM | POA: Diagnosis not present

## 2021-08-07 DIAGNOSIS — M549 Dorsalgia, unspecified: Secondary | ICD-10-CM | POA: Diagnosis not present

## 2021-08-07 DIAGNOSIS — G9341 Metabolic encephalopathy: Secondary | ICD-10-CM | POA: Diagnosis not present

## 2021-08-07 DIAGNOSIS — K219 Gastro-esophageal reflux disease without esophagitis: Secondary | ICD-10-CM | POA: Diagnosis not present

## 2021-08-07 DIAGNOSIS — Z96652 Presence of left artificial knee joint: Secondary | ICD-10-CM | POA: Diagnosis not present

## 2021-08-07 DIAGNOSIS — R531 Weakness: Secondary | ICD-10-CM | POA: Diagnosis not present

## 2021-08-07 DIAGNOSIS — F17210 Nicotine dependence, cigarettes, uncomplicated: Secondary | ICD-10-CM | POA: Diagnosis not present

## 2021-08-07 DIAGNOSIS — K5909 Other constipation: Secondary | ICD-10-CM | POA: Diagnosis not present

## 2021-08-07 DIAGNOSIS — K3189 Other diseases of stomach and duodenum: Secondary | ICD-10-CM | POA: Diagnosis not present

## 2021-08-07 DIAGNOSIS — K449 Diaphragmatic hernia without obstruction or gangrene: Secondary | ICD-10-CM | POA: Diagnosis not present

## 2021-08-11 DIAGNOSIS — K3189 Other diseases of stomach and duodenum: Secondary | ICD-10-CM | POA: Diagnosis not present

## 2021-08-11 DIAGNOSIS — Z79891 Long term (current) use of opiate analgesic: Secondary | ICD-10-CM | POA: Diagnosis not present

## 2021-08-11 DIAGNOSIS — J45909 Unspecified asthma, uncomplicated: Secondary | ICD-10-CM | POA: Diagnosis not present

## 2021-08-11 DIAGNOSIS — I69354 Hemiplegia and hemiparesis following cerebral infarction affecting left non-dominant side: Secondary | ICD-10-CM | POA: Diagnosis not present

## 2021-08-11 DIAGNOSIS — K219 Gastro-esophageal reflux disease without esophagitis: Secondary | ICD-10-CM | POA: Diagnosis not present

## 2021-08-11 DIAGNOSIS — G9341 Metabolic encephalopathy: Secondary | ICD-10-CM | POA: Diagnosis not present

## 2021-08-11 DIAGNOSIS — R531 Weakness: Secondary | ICD-10-CM | POA: Diagnosis not present

## 2021-08-11 DIAGNOSIS — F17210 Nicotine dependence, cigarettes, uncomplicated: Secondary | ICD-10-CM | POA: Diagnosis not present

## 2021-08-11 DIAGNOSIS — K449 Diaphragmatic hernia without obstruction or gangrene: Secondary | ICD-10-CM | POA: Diagnosis not present

## 2021-08-11 DIAGNOSIS — Z96652 Presence of left artificial knee joint: Secondary | ICD-10-CM | POA: Diagnosis not present

## 2021-08-11 DIAGNOSIS — M797 Fibromyalgia: Secondary | ICD-10-CM | POA: Diagnosis not present

## 2021-08-11 DIAGNOSIS — G8929 Other chronic pain: Secondary | ICD-10-CM | POA: Diagnosis not present

## 2021-08-11 DIAGNOSIS — I10 Essential (primary) hypertension: Secondary | ICD-10-CM | POA: Diagnosis not present

## 2021-08-11 DIAGNOSIS — K5909 Other constipation: Secondary | ICD-10-CM | POA: Diagnosis not present

## 2021-08-11 DIAGNOSIS — M5432 Sciatica, left side: Secondary | ICD-10-CM | POA: Diagnosis not present

## 2021-08-11 DIAGNOSIS — M549 Dorsalgia, unspecified: Secondary | ICD-10-CM | POA: Diagnosis not present

## 2021-08-12 ENCOUNTER — Encounter: Payer: Self-pay | Admitting: Diagnostic Neuroimaging

## 2021-08-12 ENCOUNTER — Ambulatory Visit (INDEPENDENT_AMBULATORY_CARE_PROVIDER_SITE_OTHER): Payer: Commercial Managed Care - HMO | Admitting: Diagnostic Neuroimaging

## 2021-08-12 VITALS — BP 124/93 | HR 100 | Ht 66.0 in

## 2021-08-12 DIAGNOSIS — Z79891 Long term (current) use of opiate analgesic: Secondary | ICD-10-CM | POA: Diagnosis not present

## 2021-08-12 DIAGNOSIS — G9341 Metabolic encephalopathy: Secondary | ICD-10-CM

## 2021-08-12 DIAGNOSIS — J45909 Unspecified asthma, uncomplicated: Secondary | ICD-10-CM | POA: Diagnosis not present

## 2021-08-12 DIAGNOSIS — K5909 Other constipation: Secondary | ICD-10-CM | POA: Diagnosis not present

## 2021-08-12 DIAGNOSIS — M797 Fibromyalgia: Secondary | ICD-10-CM | POA: Diagnosis not present

## 2021-08-12 DIAGNOSIS — E519 Thiamine deficiency, unspecified: Secondary | ICD-10-CM

## 2021-08-12 DIAGNOSIS — G8929 Other chronic pain: Secondary | ICD-10-CM | POA: Diagnosis not present

## 2021-08-12 DIAGNOSIS — Z96652 Presence of left artificial knee joint: Secondary | ICD-10-CM | POA: Diagnosis not present

## 2021-08-12 DIAGNOSIS — M5432 Sciatica, left side: Secondary | ICD-10-CM | POA: Diagnosis not present

## 2021-08-12 DIAGNOSIS — I69354 Hemiplegia and hemiparesis following cerebral infarction affecting left non-dominant side: Secondary | ICD-10-CM | POA: Diagnosis not present

## 2021-08-12 DIAGNOSIS — F17210 Nicotine dependence, cigarettes, uncomplicated: Secondary | ICD-10-CM | POA: Diagnosis not present

## 2021-08-12 DIAGNOSIS — K3189 Other diseases of stomach and duodenum: Secondary | ICD-10-CM | POA: Diagnosis not present

## 2021-08-12 DIAGNOSIS — I10 Essential (primary) hypertension: Secondary | ICD-10-CM | POA: Diagnosis not present

## 2021-08-12 DIAGNOSIS — M549 Dorsalgia, unspecified: Secondary | ICD-10-CM | POA: Diagnosis not present

## 2021-08-12 DIAGNOSIS — R531 Weakness: Secondary | ICD-10-CM | POA: Diagnosis not present

## 2021-08-12 DIAGNOSIS — K219 Gastro-esophageal reflux disease without esophagitis: Secondary | ICD-10-CM | POA: Diagnosis not present

## 2021-08-12 DIAGNOSIS — K449 Diaphragmatic hernia without obstruction or gangrene: Secondary | ICD-10-CM | POA: Diagnosis not present
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
## 2021-08-12 NOTE — Patient Instructions (Signed)
METABOLIC ENCEPHALOPATHY - chronic liver disease, chronic alcohol abuse, failure to thrive - needs thiamine 100mg  daily - patient not able to take care of her own medical / personal affairs

## 2021-08-13 ENCOUNTER — Emergency Department (HOSPITAL_COMMUNITY): Payer: Medicare Other

## 2021-08-13 ENCOUNTER — Inpatient Hospital Stay (HOSPITAL_COMMUNITY)
Admission: EM | Admit: 2021-08-13 | Discharge: 2021-08-23 | DRG: 064 | Disposition: A | Discharge status: Skilled Nursing Facility | Payer: Medicare Other | Source: Skilled Nursing Facility | Attending: Family Medicine | Admitting: Family Medicine

## 2021-08-13 ENCOUNTER — Encounter (HOSPITAL_COMMUNITY): Payer: Self-pay

## 2021-08-13 DIAGNOSIS — L89154 Pressure ulcer of sacral region, stage 4: Secondary | ICD-10-CM | POA: Diagnosis not present

## 2021-08-13 DIAGNOSIS — L89626 Pressure-induced deep tissue damage of left heel: Secondary | ICD-10-CM | POA: Diagnosis not present

## 2021-08-13 DIAGNOSIS — E872 Acidosis, unspecified: Secondary | ICD-10-CM | POA: Diagnosis present

## 2021-08-13 DIAGNOSIS — I639 Cerebral infarction, unspecified: Secondary | ICD-10-CM | POA: Diagnosis not present

## 2021-08-13 DIAGNOSIS — E875 Hyperkalemia: Secondary | ICD-10-CM | POA: Diagnosis present

## 2021-08-13 DIAGNOSIS — Z20822 Contact with and (suspected) exposure to covid-19: Secondary | ICD-10-CM | POA: Diagnosis present

## 2021-08-13 DIAGNOSIS — Z79891 Long term (current) use of opiate analgesic: Secondary | ICD-10-CM | POA: Diagnosis not present

## 2021-08-13 DIAGNOSIS — D72829 Elevated white blood cell count, unspecified: Secondary | ICD-10-CM | POA: Diagnosis not present

## 2021-08-13 DIAGNOSIS — I6381 Other cerebral infarction due to occlusion or stenosis of small artery: Secondary | ICD-10-CM | POA: Diagnosis not present

## 2021-08-13 DIAGNOSIS — E519 Thiamine deficiency, unspecified: Secondary | ICD-10-CM | POA: Diagnosis present

## 2021-08-13 DIAGNOSIS — I63211 Cerebral infarction due to unspecified occlusion or stenosis of right vertebral arteries: Secondary | ICD-10-CM | POA: Diagnosis not present

## 2021-08-13 DIAGNOSIS — I10 Essential (primary) hypertension: Secondary | ICD-10-CM | POA: Diagnosis present

## 2021-08-13 DIAGNOSIS — M797 Fibromyalgia: Secondary | ICD-10-CM | POA: Diagnosis not present

## 2021-08-13 DIAGNOSIS — R627 Adult failure to thrive: Secondary | ICD-10-CM | POA: Diagnosis not present

## 2021-08-13 DIAGNOSIS — R778 Other specified abnormalities of plasma proteins: Secondary | ICD-10-CM | POA: Diagnosis not present

## 2021-08-13 DIAGNOSIS — Z823 Family history of stroke: Secondary | ICD-10-CM

## 2021-08-13 DIAGNOSIS — D539 Nutritional anemia, unspecified: Secondary | ICD-10-CM | POA: Diagnosis present

## 2021-08-13 DIAGNOSIS — Z79899 Other long term (current) drug therapy: Secondary | ICD-10-CM

## 2021-08-13 DIAGNOSIS — E876 Hypokalemia: Secondary | ICD-10-CM | POA: Diagnosis not present

## 2021-08-13 DIAGNOSIS — Z515 Encounter for palliative care: Secondary | ICD-10-CM | POA: Diagnosis not present

## 2021-08-13 DIAGNOSIS — K3189 Other diseases of stomach and duodenum: Secondary | ICD-10-CM | POA: Diagnosis not present

## 2021-08-13 DIAGNOSIS — R9431 Abnormal electrocardiogram [ECG] [EKG]: Secondary | ICD-10-CM | POA: Diagnosis not present

## 2021-08-13 DIAGNOSIS — J45909 Unspecified asthma, uncomplicated: Secondary | ICD-10-CM | POA: Diagnosis not present

## 2021-08-13 DIAGNOSIS — K7682 Hepatic encephalopathy: Secondary | ICD-10-CM | POA: Diagnosis not present

## 2021-08-13 DIAGNOSIS — G9340 Encephalopathy, unspecified: Secondary | ICD-10-CM

## 2021-08-13 DIAGNOSIS — Z8673 Personal history of transient ischemic attack (TIA), and cerebral infarction without residual deficits: Secondary | ICD-10-CM

## 2021-08-13 DIAGNOSIS — B1920 Unspecified viral hepatitis C without hepatic coma: Secondary | ICD-10-CM | POA: Diagnosis present

## 2021-08-13 DIAGNOSIS — Z7189 Other specified counseling: Secondary | ICD-10-CM | POA: Diagnosis not present

## 2021-08-13 DIAGNOSIS — E43 Unspecified severe protein-calorie malnutrition: Secondary | ICD-10-CM | POA: Diagnosis not present

## 2021-08-13 DIAGNOSIS — I672 Cerebral atherosclerosis: Secondary | ICD-10-CM | POA: Diagnosis not present

## 2021-08-13 DIAGNOSIS — I6389 Other cerebral infarction: Secondary | ICD-10-CM | POA: Diagnosis not present

## 2021-08-13 DIAGNOSIS — Z96652 Presence of left artificial knee joint: Secondary | ICD-10-CM | POA: Diagnosis not present

## 2021-08-13 DIAGNOSIS — K449 Diaphragmatic hernia without obstruction or gangrene: Secondary | ICD-10-CM | POA: Diagnosis not present

## 2021-08-13 DIAGNOSIS — I6340 Cerebral infarction due to embolism of unspecified cerebral artery: Principal | ICD-10-CM | POA: Diagnosis present

## 2021-08-13 DIAGNOSIS — I499 Cardiac arrhythmia, unspecified: Secondary | ICD-10-CM | POA: Diagnosis not present

## 2021-08-13 DIAGNOSIS — R531 Weakness: Secondary | ICD-10-CM | POA: Diagnosis not present

## 2021-08-13 DIAGNOSIS — Z743 Need for continuous supervision: Secondary | ICD-10-CM | POA: Diagnosis not present

## 2021-08-13 DIAGNOSIS — F1011 Alcohol abuse, in remission: Secondary | ICD-10-CM | POA: Diagnosis present

## 2021-08-13 DIAGNOSIS — R0600 Dyspnea, unspecified: Secondary | ICD-10-CM

## 2021-08-13 DIAGNOSIS — L8990 Pressure ulcer of unspecified site, unspecified stage: Secondary | ICD-10-CM | POA: Insufficient documentation

## 2021-08-13 DIAGNOSIS — F17210 Nicotine dependence, cigarettes, uncomplicated: Secondary | ICD-10-CM | POA: Diagnosis present

## 2021-08-13 DIAGNOSIS — M549 Dorsalgia, unspecified: Secondary | ICD-10-CM | POA: Diagnosis not present

## 2021-08-13 DIAGNOSIS — K219 Gastro-esophageal reflux disease without esophagitis: Secondary | ICD-10-CM | POA: Diagnosis present

## 2021-08-13 DIAGNOSIS — K7030 Alcoholic cirrhosis of liver without ascites: Secondary | ICD-10-CM | POA: Diagnosis present

## 2021-08-13 DIAGNOSIS — K5909 Other constipation: Secondary | ICD-10-CM | POA: Diagnosis present

## 2021-08-13 DIAGNOSIS — R6251 Failure to thrive (child): Secondary | ICD-10-CM

## 2021-08-13 DIAGNOSIS — I69354 Hemiplegia and hemiparesis following cerebral infarction affecting left non-dominant side: Secondary | ICD-10-CM | POA: Diagnosis not present

## 2021-08-13 DIAGNOSIS — M5432 Sciatica, left side: Secondary | ICD-10-CM | POA: Diagnosis not present

## 2021-08-13 DIAGNOSIS — L89312 Pressure ulcer of right buttock, stage 2: Secondary | ICD-10-CM | POA: Diagnosis not present

## 2021-08-13 DIAGNOSIS — G8929 Other chronic pain: Secondary | ICD-10-CM | POA: Diagnosis present

## 2021-08-13 DIAGNOSIS — G9341 Metabolic encephalopathy: Secondary | ICD-10-CM | POA: Diagnosis not present

## 2021-08-13 DIAGNOSIS — Z681 Body mass index (BMI) 19 or less, adult: Secondary | ICD-10-CM

## 2021-08-13 DIAGNOSIS — G459 Transient cerebral ischemic attack, unspecified: Secondary | ICD-10-CM | POA: Diagnosis not present

## 2021-08-13 DIAGNOSIS — E878 Other disorders of electrolyte and fluid balance, not elsewhere classified: Secondary | ICD-10-CM | POA: Diagnosis not present

## 2021-08-13 DIAGNOSIS — R29810 Facial weakness: Secondary | ICD-10-CM | POA: Diagnosis not present

## 2021-08-13 DIAGNOSIS — Z880 Allergy status to penicillin: Secondary | ICD-10-CM

## 2021-08-13 DIAGNOSIS — I63541 Cerebral infarction due to unspecified occlusion or stenosis of right cerebellar artery: Secondary | ICD-10-CM | POA: Diagnosis not present

## 2021-08-13 DIAGNOSIS — R404 Transient alteration of awareness: Secondary | ICD-10-CM | POA: Diagnosis not present

## 2021-08-13 LAB — COMPREHENSIVE METABOLIC PANEL
ALT: 18 U/L (ref 0–44)
AST: 42 U/L — ABNORMAL HIGH (ref 15–41)
Albumin: 2 g/dL — ABNORMAL LOW (ref 3.5–5.0)
Alkaline Phosphatase: 256 U/L — ABNORMAL HIGH (ref 38–126)
Anion gap: 5 (ref 5–15)
BUN: 16 mg/dL (ref 8–23)
CO2: 19 mmol/L — ABNORMAL LOW (ref 22–32)
Calcium: 10.2 mg/dL (ref 8.9–10.3)
Chloride: 111 mmol/L (ref 98–111)
Creatinine, Ser: 0.96 mg/dL (ref 0.44–1.00)
GFR, Estimated: >60 mL/min (ref >60)
Glucose, Bld: 89 mg/dL (ref 70–99)
Potassium: 5.2 mmol/L — ABNORMAL HIGH (ref 3.5–5.1)
Sodium: 135 mmol/L (ref 135–145)
Total Bilirubin: 1.1 mg/dL (ref 0.3–1.2)
Total Protein: 6.4 g/dL — ABNORMAL LOW (ref 6.5–8.1)

## 2021-08-13 LAB — I-STAT CHEM 8, ED
BUN: 15 mg/dL (ref 8–23)
Calcium, Ion: 1.51 mmol/L (ref 1.15–1.40)
Chloride: 111 mmol/L (ref 98–111)
Creatinine, Ser: 0.9 mg/dL (ref 0.44–1.00)
Glucose, Bld: 82 mg/dL (ref 70–99)
HCT: 32 % — ABNORMAL LOW (ref 36.0–46.0)
Hemoglobin: 10.9 g/dL — ABNORMAL LOW (ref 12.0–15.0)
Potassium: 5.1 mmol/L (ref 3.5–5.1)
Sodium: 138 mmol/L (ref 135–145)
TCO2: 20 mmol/L — ABNORMAL LOW (ref 22–32)

## 2021-08-13 LAB — DIFFERENTIAL
Abs Immature Granulocytes: 0.16 10*3/uL — ABNORMAL HIGH (ref 0.00–0.07)
Basophils Absolute: 0 10*3/uL (ref 0.0–0.1)
Basophils Relative: 0 %
Eosinophils Absolute: 0.1 10*3/uL (ref 0.0–0.5)
Eosinophils Relative: 1 %
Immature Granulocytes: 1 %
Lymphocytes Relative: 10 %
Lymphs Abs: 1.1 10*3/uL (ref 0.7–4.0)
Monocytes Absolute: 0.7 10*3/uL (ref 0.1–1.0)
Monocytes Relative: 6 %
Neutro Abs: 9.4 10*3/uL — ABNORMAL HIGH (ref 1.7–7.7)
Neutrophils Relative %: 82 %

## 2021-08-13 LAB — CBC
HCT: 28.6 % — ABNORMAL LOW (ref 36.0–46.0)
Hemoglobin: 9.5 g/dL — ABNORMAL LOW (ref 12.0–15.0)
MCH: 34.1 pg — ABNORMAL HIGH (ref 26.0–34.0)
MCHC: 33.2 g/dL (ref 30.0–36.0)
MCV: 102.5 fL — ABNORMAL HIGH (ref 80.0–100.0)
Platelets: 225 10*3/uL (ref 150–400)
RBC: 2.79 MIL/uL — ABNORMAL LOW (ref 3.87–5.11)
RDW: 17.2 % — ABNORMAL HIGH (ref 11.5–15.5)
WBC: 11.5 10*3/uL — ABNORMAL HIGH (ref 4.0–10.5)
nRBC: 0.2 % (ref 0.0–0.2)

## 2021-08-13 LAB — PROTIME-INR
INR: 1.2 (ref 0.8–1.2)
Prothrombin Time: 15.6 seconds — ABNORMAL HIGH (ref 11.4–15.2)

## 2021-08-13 LAB — APTT: aPTT: 28 seconds (ref 24–36)

## 2021-08-13 LAB — ETHANOL: Alcohol, Ethyl (B): <10 mg/dL (ref <10)

## 2021-08-13 LAB — AMMONIA: Ammonia: 19 umol/L (ref 9–35)

## 2021-08-13 MED ORDER — SODIUM CHLORIDE 0.9% FLUSH
3.0000 mL | Freq: Once | INTRAVENOUS | Status: AC
  Administered 2021-08-13: 3 mL via INTRAVENOUS

## 2021-08-13 NOTE — ED Notes (Signed)
Date and time results received: 08/13/21 2259 (use smartphrase ".now" to insert current time)  Test: Calcium, Ion Critical Value: 1.51  Name of Provider Notified: Dr. Sabra Heck  Orders Received? Or Actions Taken?: na

## 2021-08-13 NOTE — ED Notes (Signed)
CODE STROKE PAGED @ [id number]

## 2021-08-13 NOTE — ED Notes (Signed)
CODE STROKE CANCELLED PER DR BRIAN MILLER

## 2021-08-13 NOTE — ED Provider Notes (Signed)
[facility] Provider Note   CSN: [id number] Arrival date & time: 08/13/21  2229     History  Chief Complaint  Patient presents with   Code Stroke    Colleen Lee is a 66 y.o. female.  HPI  This patient is a 66 year old female, according to the medical record which is the primary source of information she has a chronic alcoholic, evidently the patient had been admitted to the hospital back in December approximately 1 month ago with nausea and vomiting and ultimately had a decline in metabolic encephalopathy, it was unclear the cause and she eventually improved but declined skilled nursing facility placement.  She presents to the hospital again tonight with significant change in altered mental status where the sister who is the primary historian reports that over the last week she has had a gradual weakness which seems to be getting worse, she has become extremely weak today and did not really participate with physical therapy and ultimately when her sister left the room for a few minutes and came back and she was just staring at the ceiling prompting the sister to call 911.  Initially the patient is not able to answer any questions, she is not entirely encephalopathic however over the course of the evaluation she had some waxing and waning of mental status.  According to the sister there was nothing acute it was just a progressive encephalopathy over the last 1 or 2 weeks  Home Medications Prior to Admission medications   Medication Sig Start Date End Date Taking? Authorizing Provider  albuterol (VENTOLIN HFA) 108 (90 Base) MCG/ACT inhaler Inhale 2 puffs into the lungs every 6 (six) hours as needed for wheezing or shortness of breath. 07/16/21   Roxan Hockey, MD  allopurinol (ZYLOPRIM) 300 MG tablet Take 300 mg by mouth daily. 04/15/21   [provider]  atenolol (TENORMIN) 25 MG tablet Take 1 tablet (25 mg total) by mouth 2 (two) times daily. 07/16/21  07/16/22  Roxan Hockey, MD  Cholecalciferol (DIALYVITE VITAMIN D3 MAX) 1.25 MG (50000 UT) TABS Take 1 tablet by mouth every 7 (seven) days.    [provider]  cyclobenzaprine (FLEXERIL) 5 MG tablet Take 5 mg by mouth 3 (three) times daily. 04/15/21   [provider]  folic acid (FOLVITE) 1 MG tablet Take 1 tablet (1 mg total) by mouth daily. 01/06/21   Barton Dubois, MD  gabapentin (NEURONTIN) 100 MG capsule Take 1 capsule (100 mg total) by mouth 3 (three) times daily. Patient taking differently: Take 100 mg by mouth daily. 12/22/18   Carole Civil, MD  HYDROcodone-acetaminophen (NORCO/VICODIN) 5-325 MG tablet Take 1 tablet by mouth every 6 (six) hours as needed. 06/08/21   Noemi Chapel, MD  hydrOXYzine (ATARAX) 10 MG tablet Take 1 tablet (10 mg total) by mouth daily as needed. 07/16/21   Roxan Hockey, MD  lactulose (CHRONULAC) 10 GM/15ML solution Take 30 mLs (20 g total) by mouth 2 (two) times daily as needed for mild constipation or moderate constipation. 07/16/21   Roxan Hockey, MD  megestrol (MEGACE) 40 MG tablet Take 1 tablet (40 mg total) by mouth daily. 07/16/21   Roxan Hockey, MD  mupirocin ointment (BACTROBAN) 2 % Apply topically 2 (two) times daily. 06/18/21   [provider]  naloxone [facility]) nasal spray 4 mg/0.1 mL See admin instructions. ADMINISTER A SINGLE SPRAYEOF NARCAN INTO ONECNOSTRIL. REPEAT AFTER 3SMINUTES IN OTHER NOSTRIL IF NO RESPONSE. 10/03/20   [provider]  ondansetron (  ZOFRAN) 4 MG tablet Take 1 tablet (4 mg total) by mouth every 6 (six) hours as needed for nausea. 07/16/21   Roxan Hockey, MD  pantoprazole (PROTONIX) 40 MG tablet Take 1 tablet (40 mg total) by mouth 2 (two) times daily. 07/16/21 07/16/22  Roxan Hockey, MD  Plecanatide (TRULANCE) 3 MG TABS Take 3 mg by mouth daily. 06/30/21 06/30/22  Rourk, Cristopher Estimable, MD  potassium chloride (MICRO-K) 10 MEQ CR capsule Take 20 mEq by mouth daily. 05/30/21    [provider]  prednisoLONE acetate (PRED FORTE) 1 % ophthalmic suspension Place 1 drop into both eyes 2 (two) times daily. 03/10/21   [provider]  sucralfate (CARAFATE) 1 g tablet Take 1 tablet (1 g total) by mouth 4 (four) times daily -  with meals and at bedtime for 7 days. Patient not taking: Reported on 08/04/2021 07/16/21 07/23/21  Roxan Hockey, MD  thiamine 100 MG tablet Take 1 tablet (100 mg total) by mouth daily. 01/06/21   Barton Dubois, MD      Allergies    Penicillins    Review of Systems   Review of Systems  Unable to perform ROS: Mental status change   Physical Exam Updated Vital Signs SpO2 99%  Physical Exam Vitals and nursing note reviewed.  Constitutional:      General: She is not in acute distress.    Appearance: She is well-developed.  HENT:     Head: Normocephalic and atraumatic.     Mouth/Throat:     Pharynx: No oropharyngeal exudate.  Eyes:     General: No scleral icterus.       Right eye: No discharge.        Left eye: No discharge.     Conjunctiva/sclera: Conjunctivae normal.     Pupils: Pupils are equal, round, and reactive to light.  Neck:     Thyroid: No thyromegaly.     Vascular: No JVD.  Cardiovascular:     Rate and Rhythm: Normal rate and regular rhythm.     Heart sounds: Normal heart sounds. No murmur heard.   No friction rub. No gallop.  Pulmonary:     Effort: Pulmonary effort is normal. No respiratory distress.     Breath sounds: Normal breath sounds. No wheezing or rales.  Abdominal:     General: Bowel sounds are normal. There is distension.     Palpations: Abdomen is soft. There is no mass.     Tenderness: There is no abdominal tenderness.     Comments: Distended, nontender, nontympanitic abdomen  Musculoskeletal:        General: No tenderness. Normal range of motion.     Cervical back: Normal range of motion and neck supple.  Lymphadenopathy:     Cervical: No cervical adenopathy.  Skin:    General: Skin is  warm and dry.     Findings: No erythema or rash.  Neurological:     Mental Status: She is alert.     Coordination: Coordination normal.     Comments: Obtunded but arousable to loud voice or painful stimuli.  Able to grip weakly with both of her hands, she does not move either of her legs, she will not move her head from side to side, she will open and close her eyes and move her eyes from side to side.  Her speech is extremely weak  Psychiatric:        Behavior: Behavior normal.    ED Results / Procedures / Treatments  Labs (all labs ordered are listed, but only abnormal results are displayed) Labs Reviewed  RESP PANEL BY RT-PCR (FLU A&B, COVID) ARPGX2  PROTIME-INR  APTT  CBC  DIFFERENTIAL  COMPREHENSIVE METABOLIC PANEL  ETHANOL  RAPID URINE DRUG SCREEN, HOSP PERFORMED  URINALYSIS, ROUTINE W REFLEX MICROSCOPIC  AMMONIA  I-STAT CHEM 8, ED  CBG MONITORING, ED    EKG None  Radiology No results found.  Procedures Procedures    Medications Ordered in ED Medications  sodium chloride flush (NS) 0.9 % injection 3 mL (has no administration in time range)    ED Course/ Medical Decision Making/ A&P                           Medical Decision Making  This patient presents to the ED for concern of altered mental status and encephalopathy, this involves an extensive number of treatment options, and is a complaint that carries with it a high risk of complications and morbidity.  The differential diagnosis includes metabolic encephalopathy, diabetic ketoacidosis, hepatic encephalopathy, severe electrolyte abnormalities, dehydration, acute stroke, seizure, postictal state   Co morbidities that complicate the patient evaluation  Chronic alcohol use and chronic alcoholic cirrhosis Metabolic encephalopathy Prior stroke   Additional history obtained:  Additional history obtained from electronic medical record and the patient's sister External records from outside source  obtained and reviewed including prior discharge summaries admissions and prior MRIs and CT scans   Lab Tests:  I Ordered, and personally interpreted labs.  The pertinent results include: Slight anemia with a hemoglobin of 9.5, potassium of 5.2, alkaline phosphatase of 256, alcohol and ammonia both undetectably normal   Imaging Studies ordered:  I ordered imaging studies including CT head  I independently visualized and interpreted imaging which showed no acute findings of acute stroke / bleed / tumor. I agree with the radiologist interpretation   Cardiac Monitoring:  The patient was maintained on a cardiac monitor.  I personally viewed and interpreted the cardiac monitored which showed an underlying rhythm of: normal sinus rhythm.   Medicines ordered and prescription drug management:  I have reviewed the patients home medicines and have made adjustments as needed   Test Considered:  CT abd / pelvis - but no significant tenderness   Critical Interventions:  Eval for encephalopathy.   Consultations Obtained:  I requested consultation with the hospitalist Dr. Josephine Cables,  and discussed lab and imaging findings as well as pertinent plan - they recommend: Admission to the hospital   Problem List / ED Course:  Acute encephalopathy, cause unknown, patient had very thorough work-up including CT scans labs EKG and cardiac monitoring without definite etiology.   Reevaluation:  After the interventions noted above, I reevaluated the patient and found that they have :improved   Social Determinants of Health:  Substance abuse   Dispostion:  After consideration of the diagnostic results and the patients response to treatment, I feel that the patent would benefit from Admission to the hospital.          Final Clinical Impression(s) / ED Diagnoses Final diagnoses:  Encephalopathy     Noemi Chapel, MD 08/15/21 2018

## 2021-08-13 NOTE — ED Triage Notes (Signed)
Pt comes via [facility], LSN 945pm, pt was unresponsive on scene per EMS, L sided facial droop, now AMS, hx of stroke in Oct with no known residual

## 2021-08-14 ENCOUNTER — Inpatient Hospital Stay: Payer: Self-pay

## 2021-08-14 DIAGNOSIS — R531 Weakness: Secondary | ICD-10-CM | POA: Diagnosis not present

## 2021-08-14 DIAGNOSIS — I6381 Other cerebral infarction due to occlusion or stenosis of small artery: Secondary | ICD-10-CM | POA: Diagnosis not present

## 2021-08-14 DIAGNOSIS — G9341 Metabolic encephalopathy: Secondary | ICD-10-CM | POA: Diagnosis not present

## 2021-08-14 DIAGNOSIS — K5909 Other constipation: Secondary | ICD-10-CM | POA: Diagnosis present

## 2021-08-14 DIAGNOSIS — R778 Other specified abnormalities of plasma proteins: Secondary | ICD-10-CM | POA: Diagnosis not present

## 2021-08-14 DIAGNOSIS — Z515 Encounter for palliative care: Secondary | ICD-10-CM | POA: Diagnosis not present

## 2021-08-14 DIAGNOSIS — L89154 Pressure ulcer of sacral region, stage 4: Secondary | ICD-10-CM | POA: Diagnosis not present

## 2021-08-14 DIAGNOSIS — D72829 Elevated white blood cell count, unspecified: Secondary | ICD-10-CM

## 2021-08-14 DIAGNOSIS — R627 Adult failure to thrive: Secondary | ICD-10-CM

## 2021-08-14 DIAGNOSIS — G319 Degenerative disease of nervous system, unspecified: Secondary | ICD-10-CM | POA: Diagnosis not present

## 2021-08-14 DIAGNOSIS — F1011 Alcohol abuse, in remission: Secondary | ICD-10-CM | POA: Diagnosis present

## 2021-08-14 DIAGNOSIS — M797 Fibromyalgia: Secondary | ICD-10-CM | POA: Diagnosis present

## 2021-08-14 DIAGNOSIS — Z681 Body mass index (BMI) 19 or less, adult: Secondary | ICD-10-CM | POA: Diagnosis not present

## 2021-08-14 DIAGNOSIS — D539 Nutritional anemia, unspecified: Secondary | ICD-10-CM

## 2021-08-14 DIAGNOSIS — Z7189 Other specified counseling: Secondary | ICD-10-CM | POA: Diagnosis not present

## 2021-08-14 DIAGNOSIS — I63541 Cerebral infarction due to unspecified occlusion or stenosis of right cerebellar artery: Secondary | ICD-10-CM | POA: Diagnosis not present

## 2021-08-14 DIAGNOSIS — K7682 Hepatic encephalopathy: Secondary | ICD-10-CM | POA: Diagnosis not present

## 2021-08-14 DIAGNOSIS — E875 Hyperkalemia: Secondary | ICD-10-CM | POA: Diagnosis not present

## 2021-08-14 DIAGNOSIS — Z452 Encounter for adjustment and management of vascular access device: Secondary | ICD-10-CM | POA: Diagnosis not present

## 2021-08-14 DIAGNOSIS — L89312 Pressure ulcer of right buttock, stage 2: Secondary | ICD-10-CM | POA: Diagnosis present

## 2021-08-14 DIAGNOSIS — R6251 Failure to thrive (child): Secondary | ICD-10-CM

## 2021-08-14 DIAGNOSIS — R0600 Dyspnea, unspecified: Secondary | ICD-10-CM | POA: Diagnosis not present

## 2021-08-14 DIAGNOSIS — I10 Essential (primary) hypertension: Secondary | ICD-10-CM | POA: Diagnosis not present

## 2021-08-14 DIAGNOSIS — L89626 Pressure-induced deep tissue damage of left heel: Secondary | ICD-10-CM | POA: Diagnosis not present

## 2021-08-14 DIAGNOSIS — I639 Cerebral infarction, unspecified: Secondary | ICD-10-CM | POA: Diagnosis not present

## 2021-08-14 DIAGNOSIS — E43 Unspecified severe protein-calorie malnutrition: Secondary | ICD-10-CM | POA: Diagnosis not present

## 2021-08-14 DIAGNOSIS — E519 Thiamine deficiency, unspecified: Secondary | ICD-10-CM | POA: Diagnosis not present

## 2021-08-14 DIAGNOSIS — E872 Acidosis, unspecified: Secondary | ICD-10-CM | POA: Diagnosis not present

## 2021-08-14 DIAGNOSIS — I63211 Cerebral infarction due to unspecified occlusion or stenosis of right vertebral arteries: Secondary | ICD-10-CM | POA: Diagnosis not present

## 2021-08-14 DIAGNOSIS — M6281 Muscle weakness (generalized): Secondary | ICD-10-CM | POA: Diagnosis not present

## 2021-08-14 DIAGNOSIS — G8929 Other chronic pain: Secondary | ICD-10-CM | POA: Diagnosis present

## 2021-08-14 DIAGNOSIS — I63233 Cerebral infarction due to unspecified occlusion or stenosis of bilateral carotid arteries: Secondary | ICD-10-CM | POA: Diagnosis not present

## 2021-08-14 DIAGNOSIS — Q283 Other malformations of cerebral vessels: Secondary | ICD-10-CM | POA: Diagnosis not present

## 2021-08-14 DIAGNOSIS — K219 Gastro-esophageal reflux disease without esophagitis: Secondary | ICD-10-CM | POA: Diagnosis present

## 2021-08-14 DIAGNOSIS — E878 Other disorders of electrolyte and fluid balance, not elsewhere classified: Secondary | ICD-10-CM | POA: Diagnosis present

## 2021-08-14 DIAGNOSIS — I6340 Cerebral infarction due to embolism of unspecified cerebral artery: Secondary | ICD-10-CM | POA: Diagnosis not present

## 2021-08-14 DIAGNOSIS — Z20822 Contact with and (suspected) exposure to covid-19: Secondary | ICD-10-CM | POA: Diagnosis not present

## 2021-08-14 DIAGNOSIS — G9340 Encephalopathy, unspecified: Secondary | ICD-10-CM

## 2021-08-14 DIAGNOSIS — K7030 Alcoholic cirrhosis of liver without ascites: Secondary | ICD-10-CM | POA: Diagnosis present

## 2021-08-14 DIAGNOSIS — M5030 Other cervical disc degeneration, unspecified cervical region: Secondary | ICD-10-CM | POA: Diagnosis not present

## 2021-08-14 DIAGNOSIS — I6503 Occlusion and stenosis of bilateral vertebral arteries: Secondary | ICD-10-CM | POA: Diagnosis not present

## 2021-08-14 DIAGNOSIS — I6389 Other cerebral infarction: Secondary | ICD-10-CM | POA: Diagnosis not present

## 2021-08-14 LAB — COMPREHENSIVE METABOLIC PANEL
ALT: 16 U/L (ref 0–44)
AST: 34 U/L (ref 15–41)
Albumin: 1.8 g/dL — ABNORMAL LOW (ref 3.5–5.0)
Alkaline Phosphatase: 223 U/L — ABNORMAL HIGH (ref 38–126)
Anion gap: 4 — ABNORMAL LOW (ref 5–15)
BUN: 14 mg/dL (ref 8–23)
CO2: 18 mmol/L — ABNORMAL LOW (ref 22–32)
Calcium: 9.4 mg/dL (ref 8.9–10.3)
Chloride: 113 mmol/L — ABNORMAL HIGH (ref 98–111)
Creatinine, Ser: 0.72 mg/dL (ref 0.44–1.00)
GFR, Estimated: >60 mL/min (ref >60)
Glucose, Bld: 92 mg/dL (ref 70–99)
Potassium: 4.3 mmol/L (ref 3.5–5.1)
Sodium: 135 mmol/L (ref 135–145)
Total Bilirubin: 1 mg/dL (ref 0.3–1.2)
Total Protein: 5.7 g/dL — ABNORMAL LOW (ref 6.5–8.1)

## 2021-08-14 LAB — URINALYSIS, ROUTINE W REFLEX MICROSCOPIC
Bilirubin Urine: NEGATIVE
Glucose, UA: NEGATIVE mg/dL
Ketones, ur: NEGATIVE mg/dL
Nitrite: NEGATIVE
Protein, ur: NEGATIVE mg/dL
Specific Gravity, Urine: 1.016 (ref 1.005–1.030)
pH: 5 (ref 5.0–8.0)

## 2021-08-14 LAB — CBC
HCT: 26.2 % — ABNORMAL LOW (ref 36.0–46.0)
Hemoglobin: 8.8 g/dL — ABNORMAL LOW (ref 12.0–15.0)
MCH: 34.8 pg — ABNORMAL HIGH (ref 26.0–34.0)
MCHC: 33.6 g/dL (ref 30.0–36.0)
MCV: 103.6 fL — ABNORMAL HIGH (ref 80.0–100.0)
Platelets: 207 10*3/uL (ref 150–400)
RBC: 2.53 MIL/uL — ABNORMAL LOW (ref 3.87–5.11)
RDW: 17.5 % — ABNORMAL HIGH (ref 11.5–15.5)
WBC: 11.2 10*3/uL — ABNORMAL HIGH (ref 4.0–10.5)
nRBC: 0 % (ref 0.0–0.2)

## 2021-08-14 LAB — VITAMIN B12: Vitamin B-12: 1658 pg/mL — ABNORMAL HIGH (ref 180–914)

## 2021-08-14 LAB — RAPID URINE DRUG SCREEN, HOSP PERFORMED
Amphetamines: NOT DETECTED
Barbiturates: NOT DETECTED
Benzodiazepines: NOT DETECTED
Cocaine: NOT DETECTED
Opiates: POSITIVE — AB
Tetrahydrocannabinol: NOT DETECTED

## 2021-08-14 LAB — TROPONIN I (HIGH SENSITIVITY)
Troponin I (High Sensitivity): 34 ng/L — ABNORMAL HIGH (ref <18)
Troponin I (High Sensitivity): 28 ng/L — ABNORMAL HIGH (ref <18)

## 2021-08-14 LAB — MAGNESIUM
Magnesium: 1.6 mg/dL — ABNORMAL LOW (ref 1.7–2.4)
Magnesium: 2.9 mg/dL — ABNORMAL HIGH (ref 1.7–2.4)

## 2021-08-14 LAB — RESP PANEL BY RT-PCR (FLU A&B, COVID) ARPGX2
Influenza A by PCR: NEGATIVE
Influenza B by PCR: NEGATIVE
SARS Coronavirus 2 by RT PCR: NEGATIVE

## 2021-08-14 LAB — PHOSPHORUS
Phosphorus: 1.7 mg/dL — ABNORMAL LOW (ref 2.5–4.6)
Phosphorus: 3.4 mg/dL (ref 2.5–4.6)

## 2021-08-14 LAB — FOLATE: Folate: 5.5 ng/mL — ABNORMAL LOW (ref >5.9)

## 2021-08-14 LAB — GAMMA GT: GGT: 352 U/L — ABNORMAL HIGH (ref 7–50)

## 2021-08-14 MED ORDER — SODIUM CHLORIDE 0.9 % IV SOLN
1.0000 mg | Freq: Once | INTRAVENOUS | Status: DC
  Filled 2021-08-14: qty 0.2

## 2021-08-14 MED ORDER — MEGESTROL ACETATE 40 MG PO TABS
40.0000 mg | ORAL_TABLET | Freq: Every day | ORAL | Status: DC
Start: 2021-08-14 — End: 2021-08-14

## 2021-08-14 MED ORDER — MAGNESIUM SULFATE 4 GM/100ML IV SOLN
4.0000 g | Freq: Once | INTRAVENOUS | Status: AC
  Administered 2021-08-14: 4 g via INTRAVENOUS
  Filled 2021-08-14: qty 100

## 2021-08-14 MED ORDER — ATENOLOL 25 MG PO TABS: 25.0000 mg | ORAL_TABLET | Freq: Two times a day (BID) | ORAL | Status: DC

## 2021-08-14 MED ORDER — FOLIC ACID 5 MG/ML IJ SOLN
1.0000 mg | Freq: Once | INTRAMUSCULAR | Status: AC
  Administered 2021-08-14: 1 mg via INTRAVENOUS
  Filled 2021-08-14: qty 0.2

## 2021-08-14 MED ORDER — CHLORHEXIDINE GLUCONATE CLOTH 2 % EX PADS
6.0000 | MEDICATED_PAD | Freq: Every day | CUTANEOUS | Status: DC
  Administered 2021-08-14 – 2021-08-22 (×9): 6 via TOPICAL

## 2021-08-14 MED ORDER — THIAMINE HCL 100 MG PO TABS
100.0000 mg | ORAL_TABLET | Freq: Every day | ORAL | Status: DC
  Administered 2021-08-15 – 2021-08-23 (×9): 100 mg via ORAL
  Filled 2021-08-14 (×9): qty 1

## 2021-08-14 MED ORDER — THIAMINE HCL 100 MG/ML IJ SOLN
500.0000 mg | Freq: Three times a day (TID) | INTRAVENOUS | Status: AC
  Administered 2021-08-14 – 2021-08-16 (×9): 500 mg via INTRAVENOUS
  Filled 2021-08-14 (×9): qty 5

## 2021-08-14 MED ORDER — DEXTROSE-NACL 5-0.45 % IV SOLN: INTRAVENOUS | Status: DC

## 2021-08-14 MED ORDER — POTASSIUM PHOSPHATES 15 MMOLE/5ML IV SOLN
30.0000 mmol | Freq: Once | INTRAVENOUS | Status: AC
  Administered 2021-08-14: 30 mmol via INTRAVENOUS
  Filled 2021-08-14: qty 10

## 2021-08-14 MED ORDER — FOLIC ACID 1 MG PO TABS: 1.0000 mg | ORAL_TABLET | Freq: Every day | ORAL | Status: DC

## 2021-08-14 MED ORDER — PANTOPRAZOLE SODIUM 40 MG PO TBEC
40.0000 mg | DELAYED_RELEASE_TABLET | Freq: Two times a day (BID) | ORAL | Status: DC
  Administered 2021-08-15 – 2021-08-23 (×17): 40 mg via ORAL
  Filled 2021-08-14 (×18): qty 1

## 2021-08-14 MED ORDER — MAGNESIUM SULFATE 2 GM/50ML IV SOLN: 2.0000 g | Freq: Once | INTRAVENOUS | Status: DC

## 2021-08-14 MED ORDER — SODIUM CHLORIDE 0.9% FLUSH
10.0000 mL | Freq: Two times a day (BID) | INTRAVENOUS | Status: DC
  Administered 2021-08-14 – 2021-08-18 (×9): 10 mL
  Administered 2021-08-18: 30 mL
  Administered 2021-08-19: 20 mL
  Administered 2021-08-19 – 2021-08-20 (×2): 10 mL
  Administered 2021-08-20: 20 mL
  Administered 2021-08-21 – 2021-08-23 (×5): 10 mL

## 2021-08-14 MED ORDER — SODIUM CHLORIDE 0.9% FLUSH: 10.0000 mL | INTRAVENOUS | Status: DC | PRN

## 2021-08-14 MED ORDER — SODIUM CHLORIDE 0.9 % IV SOLN: INTRAVENOUS | Status: DC

## 2021-08-14 MED ORDER — ENOXAPARIN SODIUM 40 MG/0.4ML IJ SOSY
40.0000 mg | PREFILLED_SYRINGE | INTRAMUSCULAR | Status: DC
  Administered 2021-08-14: 40 mg via SUBCUTANEOUS
  Filled 2021-08-14: qty 0.4

## 2021-08-14 NOTE — Progress Notes (Signed)
Peripherally Inserted Central Catheter Placement  The IV Nurse has discussed with the patient and/or persons authorized to consent for the patient, the purpose of this procedure and the potential benefits and risks involved with this procedure.  The benefits include less needle sticks, lab draws from the catheter, and the patient may be discharged home with the catheter. Risks include, but not limited to, infection, bleeding, blood clot (thrombus formation), and puncture of an artery; nerve damage and irregular heartbeat and possibility to perform a PICC exchange if needed/ordered by physician.  Alternatives to this procedure were also discussed.  Bard Power PICC patient education guide, fact sheet on infection prevention and patient information card has been provided to patient /or left at bedside.    PICC Placement Documentation  PICC Double Lumen 08/14/21 PICC Right Brachial 37 cm 0 cm (Active)  Indication for Insertion or Continuance of Line Prolonged intravenous therapies 08/14/21 1100  Exposed Catheter (cm) 0 cm 08/14/21 1100  Site Assessment Clean;Dry;Intact 08/14/21 1100  Lumen #1 Status Flushed;Blood return noted 08/14/21 1100  Lumen #2 Status Flushed;Blood return noted 08/14/21 1100  Dressing Type Transparent 08/14/21 1100  Dressing Status Clean;Dry;Intact 08/14/21 1100  Antimicrobial disc in place? Yes 08/14/21 1100  Dressing Change Due 08/21/21 08/14/21 1100       Jule Economy Horton 08/14/2021, 11:05 AM

## 2021-08-14 NOTE — Plan of Care (Signed)
°  Problem: Acute Rehab PT Goals(only PT should resolve) Goal: Pt Will Go Supine/Side To Sit Outcome: Progressing Flowsheets (Taken 08/14/2021 1216) Pt will go Supine/Side to Sit:  with minimal assist  with moderate assist Goal: Patient Will Transfer Sit To/From Stand Outcome: Progressing Flowsheets (Taken 08/14/2021 1216) Patient will transfer sit to/from stand:  with minimal assist  with moderate assist Goal: Pt Will Transfer Bed To Chair/Chair To Bed Outcome: Progressing Flowsheets (Taken 08/14/2021 1216) Pt will Transfer Bed to Chair/Chair to Bed:  with min assist  with mod assist Goal: Pt Will Ambulate Outcome: Progressing Flowsheets (Taken 08/14/2021 1216) Pt will Ambulate:  15 feet  with moderate assist  with rolling walker   12:16 PM, 08/14/21 Lonell Grandchild, MPT Physical Therapist with [facility] [phone] office [phone] mobile phone

## 2021-08-14 NOTE — Evaluation (Signed)
Physical Therapy Evaluation Patient Details Name: Colleen Lee MRN: [id number] DOB: 23-Oct-1955 Today's Date: 08/14/2021  History of Present Illness  Colleen Lee is a 66 y.o. female with medical history significant for alcoholic liver cirrhosis, [name], asthma who presents to the emergency department via EMS due to change in mental status.  Patient was unable to provide history, history was obtained from ED physician and sister at bedside.  Per sister, patient has had 1 week of an increased and worsening weakness.  Patient was noted to be very weak today that she was not even communicative, she was noted just staring at the ceiling without responding to the sister, so EMS was activated.  Patient was noted with some waxing and waning of mental status en route to the ED.  Patient was evaluated by a neurologist (Dr. Leta Baptist) 2 days ago due to the weakness which was deemed to be likely due to patient's history of chronic liver disease, chronic alcohol abuse, failure to thrive and thiamine deficiency.  She was admitted on 12/12 and discharged on 12/15 2022 due to acute metabolic encephalopathy, intractable vomiting and SIRS   Clinical Impression  Patient demonstrates slow labored movement for sitting up at bedside with frequent leaning/falling backwards initially, but improved after 3-4 minutes and had much difficulty completing sit to stands due to BLE sliding forward and posterior leaning, LLE worse than left.  Patient required left knee blocked to complete sit to stands using RW, but unable to use RW for transfers due to poor standing balance, fall risk and required stand pivot with left knee blocked to transfer to chair.  Patient tolerated sitting up in chair after therapy - nurse notified.  Patient will benefit from continued skilled physical therapy in hospital and recommended venue below to increase strength, balance, endurance for safe ADLs and gait.       Recommendations for follow up  therapy are one component of a multi-disciplinary discharge planning process, led by the attending physician.  Recommendations may be updated based on patient status, additional functional criteria and insurance authorization.  Follow Up Recommendations Skilled nursing-short term rehab (<3 hours/day)    Assistance Recommended at Discharge Intermittent Supervision/Assistance  Patient can return home with the following  A lot of help with bathing/dressing/bathroom;A lot of help with walking and/or transfers    Equipment Recommendations Rolling walker (2 wheels)  Recommendations for Other Services       Functional Status Assessment Patient has had a recent decline in their functional status and demonstrates the ability to make significant improvements in function in a reasonable and predictable amount of time.     Precautions / Restrictions Precautions Precautions: Fall Restrictions Weight Bearing Restrictions: No      Mobility  Bed Mobility               General bed mobility comments: as per OT notes    Transfers                   General transfer comment: as per OT notes    Ambulation/Gait Ambulation/Gait assistance: Max assist Gait Distance (Feet): 2 Feet Assistive device: 1 person hand held assist Gait Pattern/deviations: Decreased step length - right;Decreased step length - left;Decreased stance time - left;Decreased stride length;Leaning posteriorly Gait velocity: slow     General Gait Details: limited to a couple of shuffling side steps with frequent sliding forward of feet especially LLE  Stairs  Wheelchair Mobility    Modified Rankin (Stroke Patients Only)       Balance Overall balance assessment: Needs assistance Sitting-balance support: Feet supported;No upper extremity supported Sitting balance-Leahy Scale: Poor Sitting balance - Comments: fair/poor with frequent falling backwards when initially sitting up Postural  control: Posterior lean Standing balance support: During functional activity;Bilateral upper extremity supported Standing balance-Leahy Scale: Poor Standing balance comment: using RW and bilateral hand held assist                             Pertinent Vitals/Pain Pain Assessment: 0-10 Pain Score: 10-Worst pain ever Pain Location: buttocks Pain Descriptors / Indicators: Guarding Pain Intervention(s): Limited activity within patient's tolerance;Monitored during session;Repositioned    Home Living Family/patient expects to be discharged to:: Private residence Living Arrangements: Spouse/significant other Available Help at Discharge: Family;Available PRN/intermittently Type of Home: House Home Access: Stairs to enter Entrance Stairs-Rails: Right Entrance Stairs-Number of Steps: 4   Home Layout: One level Home Equipment: Cane - quad Additional Comments: Patient stating no AD at home - questionable historian. History taken via document review due to pt confusion this date.    Prior Function Prior Level of Function : Needs assist       Physical Assist : Mobility (physical);ADLs (physical) Mobility (physical): Bed mobility;Transfers;Gait;Stairs   Mobility Comments: Patient stating household ambulator without AD ADLs Comments: Pt reported that husband husband assists with ADL's and IADL's.     Hand Dominance   Dominant Hand: Right    Extremity/Trunk Assessment   Upper Extremity Assessment Upper Extremity Assessment: Defer to OT evaluation    Lower Extremity Assessment Lower Extremity Assessment: Generalized weakness    Cervical / Trunk Assessment Cervical / Trunk Assessment: Normal  Communication   Communication: Expressive difficulties;Other (comment)  Cognition Arousal/Alertness: Awake/alert Behavior During Therapy: Flat affect Overall Cognitive Status: No family/caregiver present to determine baseline cognitive functioning                                           General Comments      Exercises     Assessment/Plan    PT Assessment Patient needs continued PT services  PT Problem List Decreased strength;Decreased activity tolerance;Decreased balance;Decreased mobility       PT Treatment Interventions DME instruction;Gait training;Stair training;Functional mobility training;Therapeutic activities;Therapeutic exercise;Balance training;Patient/family education    PT Goals (Current goals can be found in the Care Plan section)  Acute Rehab PT Goals Patient Stated Goal: return home PT Goal Formulation: With patient Time For Goal Achievement: 08/28/21 Potential to Achieve Goals: Good    Frequency Min 3X/week     Co-evaluation PT/OT/SLP Co-Evaluation/Treatment: Yes Reason for Co-Treatment: Complexity of the patient's impairments (multi-system involvement);To address functional/ADL transfers PT goals addressed during session: Mobility/safety with mobility;Balance;Proper use of DME         AM-PAC PT "6 Clicks" Mobility  Outcome Measure Help needed turning from your back to your side while in a flat bed without using bedrails?: A Lot Help needed moving from lying on your back to sitting on the side of a flat bed without using bedrails?: A Lot Help needed moving to and from a bed to a chair (including a wheelchair)?: A Lot Help needed standing up from a chair using your arms (e.g., wheelchair or bedside chair)?: A Lot Help needed to walk in hospital room?:  Total Help needed climbing 3-5 steps with a railing? : Total 6 Click Score: 10    End of Session Equipment Utilized During Treatment: Gait belt Activity Tolerance: Patient tolerated treatment well;Patient limited by fatigue Patient left: in chair;with call bell/phone within reach;with chair alarm set Nurse Communication: Mobility status PT Visit Diagnosis: Unsteadiness on feet (R26.81);Other abnormalities of gait and mobility (R26.89);Muscle weakness  (generalized) (M62.81)    Time: 8307-4600 PT Time Calculation (min) (ACUTE ONLY): 34 min   Charges:   PT Evaluation $PT Eval Moderate Complexity: 1 Mod PT Treatments $Therapeutic Activity: 23-37 mins        12:14 PM, 08/14/21 Lonell Grandchild, MPT Physical Therapist with [facility] [phone] office [phone] mobile phone

## 2021-08-14 NOTE — Plan of Care (Signed)
°  Problem: Acute Rehab OT Goals (only OT should resolve) Goal: Pt. Will Perform Grooming Flowsheets (Taken 08/14/2021 0908) Pt Will Perform Grooming:  sitting  with supervision  with adaptive equipment Goal: Pt. Will Perform Lower Body Bathing Flowsheets (Taken 08/14/2021 0908) Pt Will Perform Lower Body Bathing:  with min guard assist  with min assist  sitting/lateral leans  with adaptive equipment Goal: Pt. Will Perform Upper Body Dressing Flowsheets (Taken 08/14/2021 0908) Pt Will Perform Upper Body Dressing:  with supervision  sitting Goal: Pt. Will Perform Lower Body Dressing Flowsheets (Taken 08/14/2021 0908) Pt Will Perform Lower Body Dressing:  with min assist  with min guard assist  with adaptive equipment  sitting/lateral leans Goal: Pt. Will Transfer To Toilet Flowsheets (Taken 08/14/2021 0908) Pt Will Transfer to Toilet:  with min assist  stand pivot transfer Goal: Pt. Will Perform Toileting-Clothing Manipulation Flowsheets (Taken 08/14/2021 0908) Pt Will Perform Toileting - Clothing Manipulation and hygiene:  with min assist  with mod assist  sitting/lateral leans  sit to/from stand Goal: Pt/Caregiver Will Perform Home Exercise Program Flowsheets (Taken 08/14/2021 0908) Pt/caregiver will Perform Home Exercise Program:  Increased ROM  Increased strength  Both right and left upper extremity  With Supervision  Meiling Hendriks OT, MOT

## 2021-08-14 NOTE — Progress Notes (Addendum)
[facility] PROGRESS NOTE    Progress Note  Colleen Lee  ZHY:[id number] DOB: September 30, 1955 DOA: 08/13/2021 PCP: Sandi Mariscal, MD     Brief Narrative:   Colleen Lee is an 66 y.o. female past medical history of alcoholic cirrhosis, [name] asthma comes into the ED via EMS for acute metabolic encephalopathy seen recently by her neurologist who thought her weakness was secondary to chronic liver disease alcohol abuse failure to thrive and thiamine deficiency, CT of the head showed no acute intracranial abnormalities.    Assessment/Plan:   Acute metabolic encephalopathy: In the setting of alcoholic cirrhosis, ammonia level was negative.  CT of the head showed advanced cerebral atrophy with chronic small vessel ischemic changes. She was started on IV fluids, folate we will give her high-dose IV thiamine. She is becoming hyperchloremic acidotic change fluids to half-normal saline D5. At home she is currently on Megace we will go ahead and discontinued, will try to send her home off Megace at this has a high protein content in the setting of her cirrhosis can lead her to worsening hepatic encephalopathy. She has no appetite, continue to keep her n.p.o. Will replete electrolytes and vitamins and reevaluate she has no asterixis on physical exam. She relates she continues to consume alcohol.   Alcohol level less than 10, her alkaline phosphatase is mildly elevated check a GGT.  Generalized weakness: Secondary to above. Fall precaution. Physical therapy and Occupational Therapy have been asked to evaluate.  Hyperkalemia: No EKG changes, she was started on IV fluids her potassium has improved.  Hypomagnesemia: Likely due to alcohol abuse replete aggressively.  Hypophosphatemia: Be repleted orally recheck in the morning.  Elevated troponins: No chest pain have basically remained flat likely demand ischemia.  Macrocytic anemia: Likely due to alcohol abuse folate level is low  she is currently on oral repletion. B12 level is pending  Essential hypertension: She was continued on atenolol.    Leukocytosis She has remained afebrile, this morning she is more awake DVT prophylaxis: lovenox Family Communication:none Status is: Observation  The patient will require care spanning > 2 midnights and should be moved to inpatient because: Acute hepatic encephalopathy       Code Status:     Code Status Orders  (From admission, onward)           Start     Ordered   08/14/21 0223  Full code  Continuous        08/14/21 0222           Code Status History     Date Active Date Inactive Code Status Order ID Comments User Context   07/13/2021 2155 07/16/2021 2303 Full Code [id number]  Bethena Roys, MD Inpatient   05/18/2021 0004 05/22/2021 1927 Full Code [id number]  Rigby, [place], DO ED   01/03/2021 1915 01/05/2021 2303 Full Code [id number]  Bethena Roys, MD Inpatient   10/17/2019 2204 10/20/2019 1951 Full Code [id number]  Etta Quill, DO ED   01/28/2018 2102 01/30/2018 1715 Full Code [id number]  Truett Mainland, DO ED   12/15/2017 1750 12/22/2017 2122 Full Code [id number]  Murlean Iba, MD Inpatient   06/10/2016 1305 06/13/2016 1404 Full Code [id number]  Velvet Bathe, MD Inpatient   06/08/2016 2304 06/09/2016 1816 Full Code [id number]  Vianne Bulls, MD Inpatient   02/05/2015 1219 02/08/2015 1631 Full Code [id number]  Carole Civil, MD Inpatient         IV Access:  Peripheral IV   Procedures and diagnostic studies:   CT HEAD CODE STROKE WO CONTRAST  Result Date: 08/13/2021 CLINICAL DATA:  Code stroke. Initial evaluation for acute stroke, left facial droop. EXAM: CT HEAD WITHOUT CONTRAST TECHNIQUE: Contiguous axial images were obtained from the base of the skull through the vertex without intravenous contrast. RADIATION DOSE REDUCTION: This exam was performed according to the departmental dose-optimization program which  includes automated exposure control, adjustment of the mA and/or kV according to patient size and/or use of iterative reconstruction technique. COMPARISON:  Prior head CT from 08/04/2021. FINDINGS: Brain: Moderately advanced cerebral atrophy with chronic small vessel ischemic disease. No acute intracranial hemorrhage. No acute large vessel territory infarct. No mass lesion, midline shift or mass effect. No hydrocephalus or extra-axial fluid collection. Vascular: No hyperdense vessel. Calcified atherosclerosis present at the skull base. Skull: Scalp soft tissues and calvarium within normal limits. Sinuses/Orbits: Globes and orbital soft tissues demonstrate no acute finding. Visualized paranasal sinuses and mastoid air cells are clear. Other: None. ASPECTS [facility] Stroke Program Early CT Score) - Ganglionic level infarction (caudate, lentiform nuclei, internal capsule, insula, M1-M3 cortex): 7 - Supraganglionic infarction (M4-M6 cortex): 3 Total score (0-10 with 10 being normal): 10 IMPRESSION: 1. No acute intracranial infarct or other abnormality. 2. ASPECTS is 10. 3. Moderately advanced cerebral atrophy with chronic small vessel ischemic disease. Results were called by telephone at the time of interpretation on 08/13/2021 at 10:52 pm to provider [facility] , who verbally acknowledged these results. Electronically Signed   By: Jeannine Boga M.D.   On: 08/13/2021 22:52     Medical Consultants:   None.   Subjective:    Colleen Lee more awake this morning relates she is not hungry.  Objective:    Vitals:   08/14/21 0305 08/14/21 0352 08/14/21 0400 08/14/21 0510  BP: 131/83  129/85 (!) 129/97  Pulse: 72   80  Resp: 12  15 16   Temp:   97.9 F (36.6 C) 98.1 F (36.7 C)  TempSrc:   Oral Oral  SpO2: 95%  96% 93%  Weight:  63.5 kg  55.5 kg  Height:  5\' 6"  (1.676 m)  5\' 6"  (1.676 m)   SpO2: 93 %  No intake or output data in the 24 hours ending 08/14/21 0703 Filed Weights   08/14/21  0352 08/14/21 0510  Weight: 63.5 kg 55.5 kg    Exam: General exam: In no acute distress. Respiratory system: Good air movement and clear to auscultation. Cardiovascular system: S1 & S2 heard, RRR. No JVD.  Gastrointestinal system: Abdomen is nondistended, soft and nontender.  Central nervous system: Alert and oriented x2 nonfocal on physical exam her responses, she is able to move all 4 extremities without any difficulties. Extremities: No pedal edema. Skin: No rashes, lesions or ulcers Psychiatry: Impaired judgment and insight.   Data Reviewed:    Labs: Basic Metabolic Panel: Recent Labs  Lab 08/13/21 2250 08/13/21 2256 08/14/21 0342  NA 135 138 135  K 5.2* 5.1 4.3  CL 111 111 113*  CO2 19*  --  18*  GLUCOSE 89 82 92  BUN 16 15 14   CREATININE 0.96 0.90 0.72  CALCIUM 10.2  --  9.4  MG  --   --  1.6*  PHOS  --   --  1.7*   GFR Estimated Creatinine Clearance: 60.6 mL/min (by C-G formula based on SCr of 0.72 mg/dL). Liver Function Tests: Recent Labs  Lab 08/13/21 2250 08/14/21 0342  AST 42* 34  ALT 18 16  ALKPHOS 256* 223*  BILITOT 1.1 1.0  PROT 6.4* 5.7*  ALBUMIN 2.0* 1.8*   No results for input(s): LIPASE, AMYLASE in the last 168 hours. Recent Labs  Lab 08/13/21 2250  AMMONIA 19   Coagulation profile Recent Labs  Lab 08/13/21 2250  INR 1.2   COVID-19 Labs  No results for input(s): DDIMER, FERRITIN, LDH, CRP in the last 72 hours.  Lab Results  Component Value Date   SARSCOV2NAA NEGATIVE 08/13/2021   SARSCOV2NAA NEGATIVE 08/04/2021   [place] NEGATIVE 07/13/2021   [place] NEGATIVE 07/09/2021    CBC: Recent Labs  Lab 08/13/21 2250 08/13/21 2256 08/14/21 0342  WBC 11.5*  --  11.2*  NEUTROABS 9.4*  --   --   HGB 9.5* 10.9* 8.8*  HCT 28.6* 32.0* 26.2*  MCV 102.5*  --  103.6*  PLT 225  --  207   Cardiac Enzymes: No results for input(s): CKTOTAL, CKMB, CKMBINDEX, TROPONINI in the last 168 hours. BNP (last 3 results) No results  for input(s): PROBNP in the last 8760 hours. CBG: No results for input(s): GLUCAP in the last 168 hours. D-Dimer: No results for input(s): DDIMER in the last 72 hours. Hgb A1c: No results for input(s): HGBA1C in the last 72 hours. Lipid Profile: No results for input(s): CHOL, HDL, LDLCALC, TRIG, CHOLHDL, LDLDIRECT in the last 72 hours. Thyroid function studies: No results for input(s): TSH, T4TOTAL, T3FREE, THYROIDAB in the last 72 hours.  Invalid input(s): FREET3 Anemia work up: No results for input(s): VITAMINB12, FOLATE, FERRITIN, TIBC, IRON, RETICCTPCT in the last 72 hours. Sepsis Labs: Recent Labs  Lab 08/13/21 2250 08/14/21 0342  WBC 11.5* 11.2*   Microbiology Recent Results (from the past 240 hour(s))  Resp Panel by RT-PCR (Flu A&B, Covid) Nasopharyngeal Swab     Status: None   Collection Time: 08/04/21 12:25 PM   Specimen: Nasopharyngeal Swab; Nasopharyngeal(NP) swabs in vial transport medium  Result Value Ref Range Status   SARS Coronavirus 2 by RT PCR NEGATIVE NEGATIVE Final    Comment: (NOTE) SARS-CoV-2 target nucleic acids are NOT DETECTED.  The SARS-CoV-2 RNA is generally detectable in upper respiratory specimens during the acute phase of infection. The lowest concentration of SARS-CoV-2 viral copies this assay can detect is 138 copies/mL. A negative result does not preclude SARS-Cov-2 infection and should not be used as the sole basis for treatment or other patient management decisions. A negative result may occur with  improper specimen collection/handling, submission of specimen other than nasopharyngeal swab, presence of viral mutation(s) within the areas targeted by this assay, and inadequate number of viral copies(<138 copies/mL). A negative result must be combined with clinical observations, patient history, and epidemiological information. The expected result is Negative.  Fact Sheet for Patients:  EntrepreneurPulse.com.au  Fact  Sheet for Healthcare Providers:  IncredibleEmployment.be  This test is no t yet approved or cleared by the [place] FDA and  has been authorized for detection and/or diagnosis of SARS-CoV-2 by FDA under an Emergency Use Authorization (EUA). This EUA will remain  in effect (meaning this test can be used) for the duration of the COVID-19 declaration under Section 564(b)(1) of the Act, 21 U.S.C.section 360bbb-3(b)(1), unless the authorization is terminated  or revoked sooner.       Influenza A by PCR NEGATIVE NEGATIVE Final   Influenza B by PCR NEGATIVE NEGATIVE Final    Comment: (NOTE) The Xpert Xpress SARS-CoV-2/FLU/RSV plus assay is intended as an aid  in the diagnosis of influenza from Nasopharyngeal swab specimens and should not be used as a sole basis for treatment. Nasal washings and aspirates are unacceptable for Xpert Xpress SARS-CoV-2/FLU/RSV testing.  Fact Sheet for Patients: EntrepreneurPulse.com.au  Fact Sheet for Healthcare Providers: IncredibleEmployment.be  This test is not yet approved or cleared by the [place] FDA and has been authorized for detection and/or diagnosis of SARS-CoV-2 by FDA under an Emergency Use Authorization (EUA). This EUA will remain in effect (meaning this test can be used) for the duration of the COVID-19 declaration under Section 564(b)(1) of the Act, 21 U.S.C. section 360bbb-3(b)(1), unless the authorization is terminated or revoked.  Performed at [facility], [address] [place], [place] [address]   Resp Panel by RT-PCR (Flu A&B, Covid) Nasopharyngeal Swab     Status: None   Collection Time: 08/13/21  9:54 PM   Specimen: Nasopharyngeal Swab; Nasopharyngeal(NP) swabs in vial transport medium  Result Value Ref Range Status   SARS Coronavirus 2 by RT PCR NEGATIVE NEGATIVE Final    Comment: (NOTE) SARS-CoV-2 target nucleic acids are NOT DETECTED.  The SARS-CoV-2 RNA is  generally detectable in upper respiratory specimens during the acute phase of infection. The lowest concentration of SARS-CoV-2 viral copies this assay can detect is 138 copies/mL. A negative result does not preclude SARS-Cov-2 infection and should not be used as the sole basis for treatment or other patient management decisions. A negative result may occur with  improper specimen collection/handling, submission of specimen other than nasopharyngeal swab, presence of viral mutation(s) within the areas targeted by this assay, and inadequate number of viral copies(<138 copies/mL). A negative result must be combined with clinical observations, patient history, and epidemiological information. The expected result is Negative.  Fact Sheet for Patients:  EntrepreneurPulse.com.au  Fact Sheet for Healthcare Providers:  IncredibleEmployment.be  This test is no t yet approved or cleared by the [place] FDA and  has been authorized for detection and/or diagnosis of SARS-CoV-2 by FDA under an Emergency Use Authorization (EUA). This EUA will remain  in effect (meaning this test can be used) for the duration of the COVID-19 declaration under Section 564(b)(1) of the Act, 21 U.S.C.section 360bbb-3(b)(1), unless the authorization is terminated  or revoked sooner.       Influenza A by PCR NEGATIVE NEGATIVE Final   Influenza B by PCR NEGATIVE NEGATIVE Final    Comment: (NOTE) The Xpert Xpress SARS-CoV-2/FLU/RSV plus assay is intended as an aid in the diagnosis of influenza from Nasopharyngeal swab specimens and should not be used as a sole basis for treatment. Nasal washings and aspirates are unacceptable for Xpert Xpress SARS-CoV-2/FLU/RSV testing.  Fact Sheet for Patients: EntrepreneurPulse.com.au  Fact Sheet for Healthcare Providers: IncredibleEmployment.be  This test is not yet approved or cleared by the [place] FDA and has been authorized for detection and/or diagnosis of SARS-CoV-2 by FDA under an Emergency Use Authorization (EUA). This EUA will remain in effect (meaning this test can be used) for the duration of the COVID-19 declaration under Section 564(b)(1) of the Act, 21 U.S.C. section 360bbb-3(b)(1), unless the authorization is terminated or revoked.  Performed at [facility] [address] [place], [place] [address]      Medications:    enoxaparin (LOVENOX) injection  40 mg Subcutaneous Q24H   Continuous Infusions:  sodium chloride 60 mL/hr at 08/14/21 6226   magnesium sulfate bolus IVPB 4 g (08/14/21 0634)   potassium PHOSPHATE IVPB (in mmol)  LOS: 0 days   Charlynne Cousins  [facility]  08/14/2021, 7:03 AM

## 2021-08-14 NOTE — ED Notes (Signed)
Pt was lying on back. After cleaning up; pillow placed under left side and heels elevated

## 2021-08-14 NOTE — Evaluation (Signed)
Occupational Therapy Evaluation Patient Details Name: Colleen Lee MRN: [id number] DOB: 14-Oct-1955 Today's Date: 08/14/2021   History of Present Illness IVER Colleen Lee is a 66 y.o. female with medical history significant for alcoholic liver cirrhosis, [name], asthma who presents to the emergency department via EMS due to change in mental status.  Patient was unable to provide history, history was obtained from ED physician and sister at bedside.  Per sister, patient has had 1 week of an increased and worsening weakness.  Patient was noted to be very weak today that she was not even communicative, she was noted just staring at the ceiling without responding to the sister, so EMS was activated.  Patient was noted with some waxing and waning of mental status en route to the ED.  Patient was evaluated by a neurologist (Dr. Marjory Lee) 2 days ago due to the weakness which was deemed to be likely due to patient's history of chronic liver disease, chronic alcohol abuse, failure to thrive and thiamine deficiency.  She was admitted on 12/12 and discharged on 12/15 2022 due to acute metabolic encephalopathy, intractable vomiting and SIRS   Clinical Impression   Pt oriented to place but not month or year at start of session. Pt appeared confused but was able to follow commands with verbal and tactile cuing. Pt demonstrates B UE weakness and slow coordination. Pt required total assist to don socks at bed level and was unable to open a shampoo container without assist. Pt is very weak needing mod to max A for supine to sit bed mobility with fair balance once once seated for a few minutes. Pt required max A for stand pivot transfer without RW. See PT not for more details. Pt was left in chair with call bell within reach. Pt will benefit from continued OT in the hospital and recommended venue below to increase strength, balance, and endurance for safe ADL's.        Recommendations for follow up therapy are one  component of a multi-disciplinary discharge planning process, led by the attending physician.  Recommendations may be updated based on patient status, additional functional criteria and insurance authorization.   Follow Up Recommendations  Skilled nursing-short term rehab (<3 hours/day)    Assistance Recommended at Discharge Frequent or constant Supervision/Assistance  Patient can return home with the following A lot of help with walking and/or transfers;A lot of help with bathing/dressing/bathroom;Assistance with cooking/housework;Direct supervision/assist for medications management;Direct supervision/assist for financial management;Assist for transportation;Help with stairs or ramp for entrance    Functional Status Assessment  Patient has had a recent decline in their functional status and demonstrates the ability to make significant improvements in function in a reasonable and predictable amount of time.  Equipment Recommendations  None recommended by OT    Recommendations for Other Services       Precautions / Restrictions Precautions Precautions: Fall Restrictions Weight Bearing Restrictions: No      Mobility Bed Mobility Overal bed mobility: Needs Assistance Bed Mobility: Supine to Sit     Supine to sit: Mod assist;Max assist     General bed mobility comments: Slow labored movement with use of bed rail.    Transfers Overall transfer level: Needs assistance   Transfers: Sit to/from Stand;Bed to chair/wheelchair/BSC Sit to Stand: Max assist;Mod assist Stand pivot transfers: Max assist         General transfer comment: Pt unable to complete stand pivot with RW. Graded to PT standing anterior and assisting pt with stand  pivot transfer to chair.      Balance Overall balance assessment: Needs assistance Sitting-balance support: No upper extremity supported;Feet supported Sitting balance-Leahy Scale: Poor Sitting balance - Comments: poor progressing to fair at  EOB Postural control: Posterior lean Standing balance support: Bilateral upper extremity supported;During functional activity;Reliant on assistive device for balance Standing balance-Leahy Scale: Poor                             ADL either performed or assessed with clinical judgement   ADL Overall ADL's : Needs assistance/impaired     Grooming: Minimal assistance;Sitting;Wash/dry face;Applying deodorant Grooming Details (indicate cue type and reason): Per clinical judgement.         Upper Body Dressing : Minimal assistance;Sitting Upper Body Dressing Details (indicate cue type and reason): Per clinical judgement. Lower Body Dressing: Bed level;Total assistance Lower Body Dressing Details (indicate cue type and reason): Donning socks at bed level. Toilet Transfer: Maximal Cabin crew Details (indicate cue type and reason): Simulated via EOB to chair transfer by PT with OT present for safety. Toileting- Clothing Manipulation and Hygiene: Maximal assistance;Total assistance;Sit to/from stand Toileting - Clothing Manipulation Details (indicate cue type and reason): PT assisted pt in standing while peri-care was completed by this OT.       General ADL Comments: Pt demosntrates slow labored movement and weakness needing much verbal cuing and physical assist.     Vision Baseline Vision/History: 1 Wears glasses Ability to See in Adequate Light: 1 Impaired Patient Visual Report: No change from baseline Vision Assessment?: Yes Tracking/Visual Pursuits: Other (comment) (Pt did not track a visual stimulus at all. Mentation likely impacting this as well.)                Pertinent Vitals/Pain Pain Assessment: 0-10 Pain Score: 10-Worst pain ever Pain Location: buttocks Pain Descriptors / Indicators: Guarding Pain Intervention(s): Limited activity within patient's tolerance;Monitored during session;Repositioned     Hand Dominance Right    Extremity/Trunk Assessment Upper Extremity Assessment Upper Extremity Assessment: RUE deficits/detail;LUE deficits/detail RUE Deficits / Details: 3-/5 shoulder flexion; 3+ elbow and grip. RUE Sensation: WNL RUE Coordination: decreased fine motor;decreased gross motor LUE Deficits / Details: 3-/5 shoulder flexion; 3+ elbow and grip LUE Sensation: WNL LUE Coordination: decreased fine motor;decreased gross motor   Lower Extremity Assessment Lower Extremity Assessment: Defer to PT evaluation   Cervical / Trunk Assessment Cervical / Trunk Assessment: Normal   Communication Communication Communication: Expressive difficulties;Other (comment) (Mild sluring of words.)   Cognition Arousal/Alertness: Awake/alert Behavior During Therapy: Flat affect Overall Cognitive Status: No family/caregiver present to determine baseline cognitive functioning                                                        Home Living Family/patient expects to be discharged to:: Private residence Living Arrangements: Spouse/significant other Available Help at Discharge: Family Type of Home: House Home Access: Stairs to enter     Home Layout: One level     Bathroom Shower/Tub: Chief Strategy Officer: Handicapped height Bathroom Accessibility: Yes   Home Equipment: Medical laboratory scientific officer - quad   Additional Comments: Patient stating no AD at home - questionable historian. History taken via document review due to pt confusion this date.  Prior Functioning/Environment Prior Level of Function : Needs assist       Physical Assist : Mobility (physical);ADLs (physical)     Mobility Comments: Patient stating household ambulator without AD ADLs Comments: Pt reported that husband husband assists with ADL's and IADL's.        OT Problem List: Decreased strength;Decreased range of motion;Decreased activity tolerance;Impaired balance (sitting and/or standing);Impaired  vision/perception;Decreased coordination;Decreased cognition;Decreased safety awareness;Pain      OT Treatment/Interventions: Self-care/ADL training;Therapeutic exercise;DME and/or AE instruction;Therapeutic activities;Cognitive remediation/compensation;Visual/perceptual remediation/compensation;Patient/family education;Balance training    OT Goals(Current goals can be found in the care plan section) Acute Rehab OT Goals Patient Stated Goal: return home OT Goal Formulation: With patient Time For Goal Achievement: 08/28/21 Potential to Achieve Goals: Fair  OT Frequency: Min 2X/week    Co-evaluation PT/OT/SLP Co-Evaluation/Treatment: Yes Reason for Co-Treatment: Complexity of the patient's impairments (multi-system involvement)   OT goals addressed during session: ADL's and self-care                       End of Session Equipment Utilized During Treatment: Rolling walker (2 wheels)  Activity Tolerance: Patient tolerated treatment well Patient left: in chair;with call bell/phone within reach  OT Visit Diagnosis: Unsteadiness on feet (R26.81);Muscle weakness (generalized) (M62.81);Other abnormalities of gait and mobility (R26.89);Other symptoms and signs involving cognitive function                Time: 1610-9604 OT Time Calculation (min): 29 min Charges:  OT General Charges $OT Visit: 1 Visit OT Evaluation $OT Eval Moderate Complexity: 1 Mod  Santasia Rew OT, MOT  Danie Chandler 08/14/2021, 9:05 AM

## 2021-08-14 NOTE — ED Notes (Signed)
Unable to complete SSS at this time due to pts cognition .

## 2021-08-14 NOTE — Progress Notes (Signed)
Initial Nutrition Assessment  DOCUMENTATION CODES:   Severe malnutrition in context of social or environmental circumstances  INTERVENTION:  RD will follow results of SLP evaluation and add appropriate ONS  NUTRITION DIAGNOSIS:   Severe Malnutrition related to social / environmental circumstances (Ongoing ETOH use in the setting of cirrhosis) as evidenced by per patient/family report, energy intake < or equal to 75% for > or equal to 1 month, mild fat depletion, mild muscle depletion, moderate muscle depletion, percent weight loss.   GOAL:  Patient will meet greater than or equal to 90% of their needs  MONITOR:  Labs, Diet advancement, Weight trends, Supplement acceptance, PO intake  REASON FOR ASSESSMENT:   Malnutrition Screening Tool    ASSESSMENT: Patient is a 66 yo female with hx of  cirrhosis (ETOH), [name], asthma. She presents with altered mental status, acute hepatic encephalopathy.   PICC placed 1/13  Patient NPO and SLP evaluation pending. Patient not alert at time of RD visit. Nursing and family friends bedside. Reported ongoing poor appetite and continued alcohol intake. Vitamin repleted/supplemented - thiamine per MD.   Patient body weight- significant loss 5.9 kg/ 9% < 1 month and 19 kg/ 25% x 3 months. Patient requiring "a lot of help with walking and /or transfers" per PT assessment. Plans to return home per CM/SW note.   Medications reviewed: thiamine, protonix  IVF-D5/0.45% NS @ 75 ml/hr  Labs: BMP Latest Ref Rng & Units 08/15/2021 08/14/2021 08/13/2021  Glucose 70 - 99 mg/dL 94 92 82  BUN 8 - 23 mg/dL 10 14 15   Creatinine 0.44 - 1.00 mg/dL 0.67 0.72 0.90  BUN/Creat Ratio 6 - 22 (calc) - - -  Sodium 135 - 145 mmol/L 136 135 138  Potassium 3.5 - 5.1 mmol/L 4.2 4.3 5.1  Chloride 98 - 111 mmol/L 110 113(H) 111  CO2 22 - 32 mmol/L 18(L) 18(L) -  Calcium 8.9 - 10.3 mg/dL 9.7 9.4 -      NUTRITION - FOCUSED PHYSICAL EXAM: Nutrition-Focused physical exam  completed. Findings are moderate orbital, buccal  fat depletions, moderate temporal muscle depletion.   Diet Order:   Diet Order             Diet NPO time specified  Diet effective now                   EDUCATION NEEDS:  Not appropriate for education at this time  Skin:  Skin Assessment: Reviewed RN Assessment  Last BM:  1/13- type 6  Height:   Ht Readings from Last 1 Encounters:  08/14/21 5\' 6"  (1.676 m)    Weight:   Wt Readings from Last 1 Encounters:  08/15/21 57.6 kg    Ideal Body Weight:   59 kg  BMI:  Body mass index is 20.5 kg/m.  Estimated Nutritional Needs:   Kcal:  1700-1900  Protein:  78-83 gr  Fluid:  1.7 liters daily  Colman Cater MS,RD,CSG,LDN Contact: Shea Evans

## 2021-08-14 NOTE — Progress Notes (Signed)
Perform [facility] swallow screen and patient was unable to keep drinking the water, and she had to stop leaving her to stay NPO. Will have speech come to evaluate.

## 2021-08-14 NOTE — Progress Notes (Signed)
Code stroke  Call time  2215  Ems in route Bee[place]   None Start   2230 End   2233 Soc   2234 Epic   2234 [place]

## 2021-08-14 NOTE — H&P (Addendum)
History and Physical  [name]:[id number] DOB: 02-Mar-1956 DOA: 08/13/2021  Referring physician: Noemi Chapel, MD PCP: Sandi Mariscal, MD  Patient coming from: Home  Chief Complaint: Altered mental status  HPI: Colleen Lee is a 66 y.o. female with medical history significant for alcoholic liver cirrhosis, [name], asthma who presents to the emergency department via EMS due to change in mental status.  Patient was unable to provide history, history was obtained from ED physician and sister at bedside.  Per sister, patient has had 1 week of an increased and worsening weakness.  Patient was noted to be very weak today that she was not even communicative, she was noted just staring at the ceiling without responding to the sister, so EMS was activated.  Patient was noted with some waxing and waning of mental status en route to the ED. Patient was evaluated by a neurologist (Dr. Leta Baptist) 2 days ago due to the weakness which was deemed to be likely due to patient's history of chronic liver disease, chronic alcohol abuse, failure to thrive and thiamine deficiency. She was admitted on 12/12 and discharged on 12/15 2022 due to acute metabolic encephalopathy, intractable vomiting and SIRS  ED Course:  In the emergency department, patient was hemodynamically stable, work-up in the ED showed leukocytosis, macrocytic anemia, hyperkalemia, magnesium 1.6, phosphorus 1.7 troponin x1- 34.  Alcohol and ammonia levels were normal.  Influenza A, B, SARS coronavirus 2 was negative. CT of head without contrast showed no acute intracranial infarct or other abnormality. Hospitalist was asked to admit patient for further evaluation and management.  Review of Systems: This cannot be obtained at this time due to patient's current condition  Past Medical History:  Diagnosis Date   Anemia    Asthma    Chronic abdominal pain    Chronic back pain    Chronic constipation    Cirrhosis ([place])    Metavir score  F4 on elastography 8119   Cyclical vomiting    Fibroids    Fibromyalgia    GERD (gastroesophageal reflux disease)    H. pylori infection 2014   treated with pylera, had to be treated again as it was not eradicated. Urea breath test then negative after subsequent treatment.    [name]    HCV RNA positive 09/2012   Hypertension    Marijuana use    Nausea and vomiting    chronic, recurrent   PONV (postoperative nausea and vomiting)    pt thinks maybe once she had N&V   Sciatica of left side    Past Surgical History:  Procedure Laterality Date   BALLOON DILATION  12/20/2017   Procedure: BALLOON DILATION;  Surgeon: Danie Binder, MD;  [facility];  Service: Endoscopy;;  pyloric dilation   BIOPSY  12/20/2017   Procedure: BIOPSY;  Surgeon: Danie Binder, MD;  [facility];  Service: Endoscopy;;  duodenal and gastric biopsy   BIOPSY  07/15/2021   Procedure: BIOPSY;  Surgeon: Harvel Quale, MD;  [facility];  Service: Gastroenterology;;   COLONOSCOPY  01/18/2008   JYN:WGNFAO rectum.  Long redundant colon, a diminutive sigmoid polyp status post cold biopsy removed. Hyperplastic polyp. Repeat colonoscopy June 2014 due to family history of colon cancer   COLONOSCOPY WITH ESOPHAGOGASTRODUODENOSCOPY (EGD) N/A 11/02/2012   ZHY:QMVHQION gastric mucosa of doubtful, +H.pylori. Incomplete colonoscopy due to patient unable to tolerate exam, proximal colon seen. Patient refused ACBE.   COLONOSCOPY WITH PROPOFOL N/A 03/08/2013  Dr. Gala Romney: colonic polyp-removed as scribed above. Internal Hemorrhoids. Pathology did not reveal any colonic tissue, only mucus. SURVEILLANCE DUE Aug 2019   COLONOSCOPY WITH PROPOFOL N/A 01/19/2018   Dr. Gala Romney: 6 mm cecal inflammatoy polyp, otherwise normal., Surveilance in 5 year s   ESOPHAGEAL DILATION  12/20/2017   Procedure: ESOPHAGEAL DILATION;  Surgeon: Danie Binder, MD;  [facility];  Service: Endoscopy;;    ESOPHAGOGASTRODUODENOSCOPY  01/18/2008   RMR: Normal esophagus, normal  stomach   ESOPHAGOGASTRODUODENOSCOPY (EGD) WITH PROPOFOL N/A 02/09/2016   Normal esophagus, small hiatal hernia, portal hypertensive gastropathy, normal second portion of duodenum. Due in July 2019   ESOPHAGOGASTRODUODENOSCOPY (EGD) WITH PROPOFOL N/A 12/20/2017   Dr. Oneida Alar: benign appearing esophageal stenosis s/p dilation, mild pyloric stenosis s/p biopsy and dilation, mild duodenitis.    ESOPHAGOGASTRODUODENOSCOPY (EGD) WITH PROPOFOL N/A 06/12/2020   Rourk: Normal esophagus, portal hypertensive gastropathy, no specimens collected.  Tentatively plan for repeat EGD in 2 years.   ESOPHAGOGASTRODUODENOSCOPY (EGD) WITH PROPOFOL N/A 07/15/2021   Procedure: ESOPHAGOGASTRODUODENOSCOPY (EGD) WITH PROPOFOL;  Surgeon: Harvel Quale, MD;  [facility];  Service: Gastroenterology;  Laterality: N/A;   HYSTEROSCOPY  05/11/2016   Procedure: HYSTEROSCOPY;  Surgeon: Jonnie Kind, MD;  [facility];  Service: Gynecology;;   KNEE SURGERY     left knee   MULTIPLE EXTRACTIONS WITH ALVEOLOPLASTY N/A 10/15/2013   Procedure: MULTIPLE EXTRACTION WITH ALVEOLOPLASTY;  Surgeon: Gae Bon, DDS;  Location: [place];  Service: Oral Surgery;  Laterality: N/A;   POLYPECTOMY N/A 03/08/2013   Procedure: POLYPECTOMY;  Surgeon: Daneil Dolin, MD;  Location: [facility];  Service: Endoscopy;  Laterality: N/A;   POLYPECTOMY N/A 05/11/2016   Procedure: REMOVAL OF ENDOMETRIAL POLYP;  Surgeon: Jonnie Kind, MD;  Location: [facility];  Service: Gynecology;  Laterality: N/A;   RESECTION DISTAL CLAVICAL Right 01/30/2019   Procedure: RESECTION DISTAL CLAVICAL;  Surgeon: Carole Civil, MD;  [facility];  Service: Orthopedics;  Laterality: Right;   SHOULDER OPEN ROTATOR CUFF REPAIR Right 01/30/2019   Procedure: ROTATOR CUFF REPAIR SHOULDER OPEN;  Surgeon: Carole Civil, MD;  Location: [facility];  Service: Orthopedics;  Laterality:  Right;  pt to arrive at 7:30 for PICC at 8:00   [place] Left 02/05/2015   Procedure: LEFT TOTAL KNEE ARTHROPLASTY;  Surgeon: Carole Civil, MD;  Location: [facility];  Service: Orthopedics;  Laterality: Left;    Social History:  reports that she has been smoking cigarettes. She has a 40.00 pack-year smoking history. She has never used smokeless tobacco. She reports that she does not currently use alcohol. She reports that she does not currently use drugs after having used the following drugs: Marijuana.   Allergies  Allergen Reactions   Penicillins Rash    Did it involve swelling of the face/tongue/throat, SOB, or low BP? No Did it involve sudden or severe rash/hives, skin peeling, or any reaction on the inside of your mouth or nose? No Did you need to seek medical attention at a hospital or doctor's office? No When did it last happen? Many years ago      If all above answers are NO, may proceed with cephalosporin use.      Family History  Problem Relation Age of Onset   Cancer Mother        breast   Asthma Mother    Liver cancer Sister    Lung cancer Sister    Colon cancer  Brother 31           Multiple myeloma Brother    Prostate cancer Brother    Pancreatic cancer Brother     Prior to Admission medications   Medication Sig Start Date End Date Taking? Authorizing Provider  albuterol (VENTOLIN HFA) 108 (90 Base) MCG/ACT inhaler Inhale 2 puffs into the lungs every 6 (six) hours as needed for wheezing or shortness of breath. 07/16/21   Roxan Hockey, MD  allopurinol (ZYLOPRIM) 300 MG tablet Take 300 mg by mouth daily. 04/15/21   [provider]  atenolol (TENORMIN) 25 MG tablet Take 1 tablet (25 mg total) by mouth 2 (two) times daily. 07/16/21 07/16/22  Roxan Hockey, MD  Cholecalciferol (DIALYVITE VITAMIN D3 MAX) 1.25 MG (50000 UT) TABS Take 1 tablet by mouth every 7 (seven) days.    [provider]  cyclobenzaprine (FLEXERIL) 5 MG tablet  Take 5 mg by mouth 3 (three) times daily. 04/15/21   [provider]  folic acid (FOLVITE) 1 MG tablet Take 1 tablet (1 mg total) by mouth daily. 01/06/21   Barton Dubois, MD  gabapentin (NEURONTIN) 100 MG capsule Take 1 capsule (100 mg total) by mouth 3 (three) times daily. Patient taking differently: Take 100 mg by mouth daily. 12/22/18   Carole Civil, MD  HYDROcodone-acetaminophen (NORCO/VICODIN) 5-325 MG tablet Take 1 tablet by mouth every 6 (six) hours as needed. 06/08/21   Noemi Chapel, MD  hydrOXYzine (ATARAX) 10 MG tablet Take 1 tablet (10 mg total) by mouth daily as needed. 07/16/21   Roxan Hockey, MD  lactulose (CHRONULAC) 10 GM/15ML solution Take 30 mLs (20 g total) by mouth 2 (two) times daily as needed for mild constipation or moderate constipation. 07/16/21   Roxan Hockey, MD  megestrol (MEGACE) 40 MG tablet Take 1 tablet (40 mg total) by mouth daily. 07/16/21   Roxan Hockey, MD  mupirocin ointment (BACTROBAN) 2 % Apply topically 2 (two) times daily. 06/18/21   [provider]  naloxone [facility]) nasal spray 4 mg/0.1 mL See admin instructions. ADMINISTER A SINGLE SPRAYEOF NARCAN INTO ONECNOSTRIL. REPEAT AFTER 3SMINUTES IN OTHER NOSTRIL IF NO RESPONSE. 10/03/20   [provider]  ondansetron (ZOFRAN) 4 MG tablet Take 1 tablet (4 mg total) by mouth every 6 (six) hours as needed for nausea. 07/16/21   Roxan Hockey, MD  pantoprazole (PROTONIX) 40 MG tablet Take 1 tablet (40 mg total) by mouth 2 (two) times daily. 07/16/21 07/16/22  Roxan Hockey, MD  Plecanatide (TRULANCE) 3 MG TABS Take 3 mg by mouth daily. 06/30/21 06/30/22  Rourk, Cristopher Estimable, MD  potassium chloride (MICRO-K) 10 MEQ CR capsule Take 20 mEq by mouth daily. 05/30/21   [provider]  prednisoLONE acetate (PRED FORTE) 1 % ophthalmic suspension Place 1 drop into both eyes 2 (two) times daily. 03/10/21   [provider]  sucralfate (CARAFATE) 1 g tablet Take 1 tablet  (1 g total) by mouth 4 (four) times daily -  with meals and at bedtime for 7 days. Patient not taking: Reported on 08/04/2021 07/16/21 07/23/21  Roxan Hockey, MD  thiamine 100 MG tablet Take 1 tablet (100 mg total) by mouth daily. 01/06/21   Barton Dubois, MD    Physical Exam: BP (!) 129/97 (BP Location: Right Arm)    Pulse 80    Temp 98.1 F (36.7 C) (Oral)    Resp 16    Ht $R'5\' 6"'Sk$  (1.676 m)    Wt 55.5 kg Comment: PT does  not stand   SpO2 93%    BMI 19.75 kg/m   General: 66 y.o. year-old female ill appearing but in no acute distress.  Somnolent, but easily arousable  HEENT: NCAT, PERRL Neck: Supple, trachea medial Cardiovascular: Regular rate and rhythm with no rubs or gallops.  No thyromegaly or JVD noted.  No lower extremity edema. 2/4 pulses in all 4 extremities. Respiratory: Clear to auscultation with no wheezes or rales. Good inspiratory effort. Abdomen: Soft, nontender nondistended with normal bowel sounds x4 quadrants. Muskuloskeletal: No cyanosis, clubbing or edema noted bilaterally Neuro: Grip strength 3/5 in  LUE and 2/5 in  RUE.patient opens eyes to voice and was able to track with eyes.  Minimal elevation of lower extremities  Skin: No ulcerative lesions noted or rashes Psychiatry: Mood is appropriate for condition and setting          Labs on Admission:  Basic Metabolic Panel: Recent Labs  Lab 08/13/21 2250 08/13/21 2256 08/14/21 0342  NA 135 138 135  K 5.2* 5.1 4.3  CL 111 111 113*  CO2 19*  --  18*  GLUCOSE 89 82 92  BUN $Re'16 15 14  'XhD$ CREATININE 0.96 0.90 0.72  CALCIUM 10.2  --  9.4  MG  --   --  1.6*  PHOS  --   --  1.7*   Liver Function Tests: Recent Labs  Lab 08/13/21 2250 08/14/21 0342  AST 42* 34  ALT 18 16  ALKPHOS 256* 223*  BILITOT 1.1 1.0  PROT 6.4* 5.7*  ALBUMIN 2.0* 1.8*   No results for input(s): LIPASE, AMYLASE in the last 168 hours. Recent Labs  Lab 08/13/21 2250  AMMONIA 19   CBC: Recent Labs  Lab 08/13/21 2250 08/13/21 2256  08/14/21 0342  WBC 11.5*  --  11.2*  NEUTROABS 9.4*  --   --   HGB 9.5* 10.9* 8.8*  HCT 28.6* 32.0* 26.2*  MCV 102.5*  --  103.6*  PLT 225  --  207   Cardiac Enzymes: No results for input(s): CKTOTAL, CKMB, CKMBINDEX, TROPONINI in the last 168 hours.  BNP (last 3 results) No results for input(s): BNP in the last 8760 hours.  ProBNP (last 3 results) No results for input(s): PROBNP in the last 8760 hours.  CBG: No results for input(s): GLUCAP in the last 168 hours.  Radiological Exams on Admission: CT HEAD CODE STROKE WO CONTRAST  Result Date: 08/13/2021 CLINICAL DATA:  Code stroke. Initial evaluation for acute stroke, left facial droop. EXAM: CT HEAD WITHOUT CONTRAST TECHNIQUE: Contiguous axial images were obtained from the base of the skull through the vertex without intravenous contrast. RADIATION DOSE REDUCTION: This exam was performed according to the departmental dose-optimization program which includes automated exposure control, adjustment of the mA and/or kV according to patient size and/or use of iterative reconstruction technique. COMPARISON:  Prior head CT from 08/04/2021. FINDINGS: Brain: Moderately advanced cerebral atrophy with chronic small vessel ischemic disease. No acute intracranial hemorrhage. No acute large vessel territory infarct. No mass lesion, midline shift or mass effect. No hydrocephalus or extra-axial fluid collection. Vascular: No hyperdense vessel. Calcified atherosclerosis present at the skull base. Skull: Scalp soft tissues and calvarium within normal limits. Sinuses/Orbits: Globes and orbital soft tissues demonstrate no acute finding. Visualized paranasal sinuses and mastoid air cells are clear. Other: None. ASPECTS [facility] Stroke Program Early CT Score) - Ganglionic level infarction (caudate, lentiform nuclei, internal capsule, insula, M1-M3 cortex): 7 - Supraganglionic infarction (M4-M6 cortex): 3 Total score (0-10 with  10 being normal): 10 IMPRESSION: 1.  No acute intracranial infarct or other abnormality. 2. ASPECTS is 10. 3. Moderately advanced cerebral atrophy with chronic small vessel ischemic disease. Results were called by telephone at the time of interpretation on 08/13/2021 at 10:52 pm to provider [facility] , who verbally acknowledged these results. Electronically Signed   By: Jeannine Boga M.D.   On: 08/13/2021 22:52    EKG: I independently viewed the EKG done and my findings are as followed: Normal sinus rhythm at a rate of 85 bpm  Assessment/Plan Present on Admission:  Acute metabolic encephalopathy  Leukocytosis  Principal Problem:   Acute metabolic encephalopathy Active Problems:   Leukocytosis   Macrocytic anemia   Hyperkalemia   Failure to thrive (child)   Hypomagnesemia   Hypophosphatemia   Elevated troponin   Failure to thrive in adult  Acute metabolic encephalopathy due to unknown cause Failure to thrive/Alcoholic cirrhosis of liver/hep C This may be related to the patient's chronic history of alcoholism and cirrhosis, but ammonia level was normal. Continue IV hydration Continue thiamine and folic acid  Generalized weakness possibly secondary to above Continue treatment as described above Continue fall precaution and neurochecks Continue PT/OT eval and treat  Hyperkalemia K+ 5.2, no EKG changes noted Continue IV hydration Recheck potassium level and treat accordingly  Hypomagnesemia Magnesium 1.6, this will be replenished  Hypophosphatemia Phosphorus 1.7, this will be replenished  Elevated troponin possibly secondary to type II demand ischemia Troponin x1-34, patient denies any chest pain Continue to trend troponin level  Macrocytic anemia MCV 102.5, folate level and vitamin B12 levels will be checked  Essential hypertension (controlled) Continue atenolol  GERD Continue Protonix  Goal of care: Palliative care will be consulted   DVT prophylaxis: Lovenox  Code Status: Full  code  Family Communication: Sister at bedside (all questions answered to satisfaction)  Disposition Plan:  Patient is from:                        home Anticipated DC to:                   SNF or family members home Anticipated DC date:               2-3 days Anticipated DC barriers:          Patient requires inpatient management due to severity of illness    Consults called: None  Admission status: Observation    Bernadette Hoit MD [facility]  08/14/2021, 5:33 AM

## 2021-08-14 NOTE — TOC Initial Note (Signed)
Transition of Care [facility]) - Initial/Assessment Note    Patient Details  Name: Colleen Lee MRN: [id number] Date of Birth: Aug 20, 1955  Transition of Care [facility]) CM/SW Contact:    Shade Flood, LCSW Phone Number: 08/14/2021, 1:44 PM  Clinical Narrative:                  Pt admitted from home. PT/OT recommending SNF rehab at dc. Spoke with pt's sister, Tim Lair, to review dc planning. Per Tim Lair, prior to admission, Lily and pt's brother had been caring for pt 24/7. Tim Lair states that pt was not ambulatory prior to admission. Discussed therapy recommendations with Tim Lair who states that they do not want pt to go to SNF and they will take her home. Discussed DME options with Lily who states that they do not currently have much in the way of DME. It appears pt would benefit from a hospital bed, hoyer lift, wheelchair, and 3in1 at dc.   Pt is active with [facility] for RN, PT/OT and Tim Lair states that they would like to continue with those services at dc. Katheren Puller at [facility].  Anticipating pt will need EMS transport at dc.  Updated MD on above. TOC will follow.  Expected Discharge Plan: [place] Barriers to Discharge: Continued Medical Work up   Patient Goals and CMS Choice Patient states their goals for this hospitalization and ongoing recovery are:: return home CMS Medicare.gov Compare Post Acute Care list provided to:: Patient Represenative (must comment) Choice offered to / list presented to : Sibling  Expected Discharge Plan and Services Expected Discharge Plan: [place] In-house Referral: Clinical Social Work   Post Acute Care Choice: Resumption of Svcs/PTA Provider, Durable Medical Equipment Living arrangements for the past 2 months: [place]                                      Prior Living Arrangements/Services Living arrangements for the past 2 months: Single Family Home Lives with:: Siblings Patient language and need for  interpreter reviewed:: Yes Do you feel safe going back to the place where you live?: Yes      Need for Family Participation in Patient Care: Yes (Comment) Care giver support system in place?: Yes (comment) Current home services: DME Criminal Activity/Legal Involvement Pertinent to Current Situation/Hospitalization: No - Comment as needed  Activities of Daily Living      Permission Sought/Granted Permission sought to share information with : Facility Art therapist granted to share information with : Yes, Verbal Permission Granted     Permission granted to share info w AGENCY: DME        Emotional Assessment       Orientation: : Oriented to Self Alcohol / Substance Use: Not Applicable Psych Involvement: No (comment)  Admission diagnosis:  Encephalopathy [W46.65] Acute metabolic encephalopathy [L93.57] Patient Active Problem List   Diagnosis Date Noted   Hyperkalemia 08/14/2021   Failure to thrive (child) 08/14/2021   Hypomagnesemia 08/14/2021   Hypophosphatemia 08/14/2021   Elevated troponin 08/14/2021   Failure to thrive in adult 08/14/2021   Encephalopathy    Portal hypertension ([place]) 07/15/2021   SIRS (systemic inflammatory response syndrome) ([place]) 07/14/2021   RUQ abdominal pain    Macrocytic anemia    Intractable vomiting 01/77/9390   Acute metabolic encephalopathy 30/04/2329   Weakness    Malaise    Pancreatitis, recurrent  05/17/2021   Dysuria 09/16/2020   Body mass index (BMI) 31.0-31.9, adult 01/07/2020   Osteoarthritis of spine with radiculopathy, 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   Acute pancreatitis 10/17/2019   ETOH abuse 10/17/2019   S/P right rotator cuff repair 01/30/19 02/06/2019   Nontraumatic complete tear of right rotator cuff    Arthritis of right acromioclavicular joint    S/P total knee replacement, 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/24/2018   Shoulder impingement, right 05/24/2018   Dehydration    Intractable cyclical vomiting 57/84/6962    Pyloric stenosis in adult    Thrombocytopenia ([place]) 12/20/2017   Leukocytosis    Malnutrition of moderate degree 12/17/2017   Acute renal failure (ARF) ([place]) 12/15/2017   Chronic abdominal pain 12/15/2017   Gastroenteritis 06/14/2016   Nausea with vomiting 06/10/2016   Uterine enlargement 06/09/2016   Intractable nausea and vomiting 06/08/2016   Acute infective gastroenteritis 06/08/2016   Diarrhea 06/08/2016   Essential hypertension 06/08/2016   GERD (gastroesophageal reflux disease) 06/08/2016   Abdominal pain    Endometrial polyp 04/15/2016   PMB (postmenopausal bleeding) 95/28/4132   Alcoholic cirrhosis of liver without ascites ([place])    Asthma 01/21/2016   Hepatic cirrhosis ([place]) 09/22/2015   Arthritis of knee, degenerative 44/08/270   History of Helicobacter pylori infection 10/30/2014   Lumbago with sciatica 07/01/2014   Chronic [name] with cirrhosis ([place]) 04/11/2014   De Quervain's disease (radial styloid tenosynovitis) 12/25/2013   Anorexia 11/21/2012   FH: colon cancer 11/21/2012   Early satiety 10/25/2012   Bowel habit changes 10/25/2012   Abdominal pain, epigastric 10/25/2012   Abdominal bloating 10/25/2012   Constipation 10/25/2012   Abnormal weight loss 10/25/2012   Chronic viral [name] ([place]) 10/25/2012   Radicular pain of left lower extremity 09/28/2012   Back pain 09/28/2012   Sciatica 08/10/2011   S/P arthroscopy of left knee 08/10/2011   Tibial plateau fracture 08/10/2011   Pain in joint, lower leg 02/12/2011   Stiffness of joint, not elsewhere classified, lower leg 02/12/2011   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   Meniscus, medial, derangement 12/29/2010   CLOSED FRACTURE OF UPPER END OF TIBIA 08/12/2010   PCP:  Sandi Mariscal, MD Pharmacy:   [facility] [address] [place] [address] Phone: [phone] Fax: [phone]     Social Determinants of Health (SDOH) Interventions     Readmission Risk Interventions Readmission Risk Prevention Plan 08/14/2021 07/16/2021 05/19/2021  Transportation Screening Complete Complete Complete  HRI or Home Care Consult - - Complete  Social Work Consult for [place] Planning/Counseling - - Complete  Palliative Care Screening - - Not Applicable  Medication Review Press photographer) Complete Complete Complete  HRI or Home Care Consult Complete Complete -  SW Recovery Care/Counseling Consult Complete Complete -  Palliative Care Screening - Not Applicable -  Skilled Nursing Facility Complete Complete -  Some recent data might be hidden

## 2021-08-15 DIAGNOSIS — E43 Unspecified severe protein-calorie malnutrition: Secondary | ICD-10-CM

## 2021-08-15 DIAGNOSIS — R6251 Failure to thrive (child): Secondary | ICD-10-CM

## 2021-08-15 DIAGNOSIS — I10 Essential (primary) hypertension: Secondary | ICD-10-CM

## 2021-08-15 DIAGNOSIS — R531 Weakness: Secondary | ICD-10-CM

## 2021-08-15 DIAGNOSIS — L8990 Pressure ulcer of unspecified site, unspecified stage: Secondary | ICD-10-CM | POA: Insufficient documentation

## 2021-08-15 LAB — MAGNESIUM: Magnesium: 2.4 mg/dL (ref 1.7–2.4)

## 2021-08-15 LAB — BASIC METABOLIC PANEL
Anion gap: 8 (ref 5–15)
BUN: 10 mg/dL (ref 8–23)
CO2: 18 mmol/L — ABNORMAL LOW (ref 22–32)
Calcium: 9.7 mg/dL (ref 8.9–10.3)
Chloride: 110 mmol/L (ref 98–111)
Creatinine, Ser: 0.67 mg/dL (ref 0.44–1.00)
GFR, Estimated: >60 mL/min (ref >60)
Glucose, Bld: 94 mg/dL (ref 70–99)
Potassium: 4.2 mmol/L (ref 3.5–5.1)
Sodium: 136 mmol/L (ref 135–145)

## 2021-08-15 LAB — PHOSPHORUS: Phosphorus: 1.8 mg/dL — ABNORMAL LOW (ref 2.5–4.6)

## 2021-08-15 MED ORDER — POTASSIUM PHOSPHATES 15 MMOLE/5ML IV SOLN
30.0000 mmol | Freq: Once | INTRAVENOUS | Status: AC
  Administered 2021-08-15: 30 mmol via INTRAVENOUS
  Filled 2021-08-15: qty 10

## 2021-08-15 MED ORDER — AMLODIPINE BESYLATE 5 MG PO TABS
5.0000 mg | ORAL_TABLET | Freq: Every day | ORAL | Status: DC
  Administered 2021-08-15 – 2021-08-23 (×8): 5 mg via ORAL
  Filled 2021-08-15 (×9): qty 1

## 2021-08-15 MED ORDER — LACTULOSE 10 GM/15ML PO SOLN
10.0000 g | Freq: Two times a day (BID) | ORAL | Status: DC
  Administered 2021-08-15 – 2021-08-23 (×16): 10 g via ORAL
  Filled 2021-08-15 (×17): qty 30

## 2021-08-15 NOTE — Progress Notes (Signed)
[facility] PROGRESS NOTE    Progress Note  Colleen Lee  CWU:[id number] DOB: 1956/07/23 DOA: 08/13/2021 PCP: Sandi Mariscal, MD     Brief Narrative:   Colleen Lee is an 66 y.o. female past medical history of alcoholic cirrhosis, [name] asthma comes into the ED via EMS for acute metabolic encephalopathy seen recently by her neurologist who thought her weakness was secondary to chronic liver disease alcohol abuse failure to thrive and thiamine deficiency, CT of the head showed no acute intracranial abnormalities. In the setting of alcoholic cirrhosis, ammonia level was negative.  CT of the head showed advanced cerebral atrophy with chronic small vessel ischemic changes. She was started on IV fluids, folate we will give her high-dose IV thiamine.    Assessment/Plan:   Acute metabolic encephalopathy: At home she is currently on Megace we will go ahead and discontinued, will try to send her home off Megace at this has a high protein content in the setting of her cirrhosis can lead her to worsening hepatic encephalopathy. Her GGT is significantly elevated, and her LFTs have stabilized which points to her ongoing chronic alcohol consumption. Her mentation is significantly improved. Consult palliative care. Check a swallowing evaluation  Generalized weakness: Secondary to above. Physical therapy evaluated the patient, she will need to go to short-term rehab. Family wants to take her home  Hyperkalemia: Resolved with IV fluid hydration.  Hypomagnesemia: Likely due to alcohol abuse. Repleted now resolved.  Hypophosphatemia: Phosphorus is low again this morning will replete IV recheck in the morning.  Elevated troponins: No chest pain have basically remained flat likely demand ischemia.  Macrocytic anemia: Likely due to alcohol abuse folate level is low she is currently on oral repletion. B12 level is >1000  Essential hypertension: She was continued on atenolol.     Leukocytosis She has remained afebrile, this morning she is more awake DVT prophylaxis: lovenox Family Communication:none Status is: Observation  The patient will require care spanning > 2 midnights and should be moved to inpatient because: Acute hepatic encephalopathy       Code Status:     Code Status Orders  (From admission, onward)           Start     Ordered   08/14/21 0223  Full code  Continuous        08/14/21 0222           Code Status History     Date Active Date Inactive Code Status Order ID Comments User Context   07/13/2021 2155 07/16/2021 2303 Full Code [id number]  Bethena Roys, MD Inpatient   05/18/2021 0004 05/22/2021 1927 Full Code [id number]  [place], Mathews, DO ED   01/03/2021 1915 01/05/2021 2303 Full Code [id number]  Bethena Roys, MD Inpatient   10/17/2019 2204 10/20/2019 1951 Full Code [id number]  Etta Quill, DO ED   01/28/2018 2102 01/30/2018 1715 Full Code [id number]  Truett Mainland, DO ED   12/15/2017 1750 12/22/2017 2122 Full Code [id number]  Murlean Iba, MD Inpatient   06/10/2016 1305 06/13/2016 1404 Full Code [id number]  Velvet Bathe, MD Inpatient   06/08/2016 2304 06/09/2016 1816 Full Code [id number]  Vianne Bulls, MD Inpatient   02/05/2015 1219 02/08/2015 1631 Full Code [id number]  Carole Civil, MD Inpatient         IV Access:   Peripheral IV   Procedures and diagnostic studies:   CT HEAD CODE STROKE WO CONTRAST  Result Date: 08/13/2021 CLINICAL  DATA:  Code stroke. Initial evaluation for acute stroke, left facial droop. EXAM: CT HEAD WITHOUT CONTRAST TECHNIQUE: Contiguous axial images were obtained from the base of the skull through the vertex without intravenous contrast. RADIATION DOSE REDUCTION: This exam was performed according to the departmental dose-optimization program which includes automated exposure control, adjustment of the mA and/or kV according to patient size and/or use of iterative  reconstruction technique. COMPARISON:  Prior head CT from 08/04/2021. FINDINGS: Brain: Moderately advanced cerebral atrophy with chronic small vessel ischemic disease. No acute intracranial hemorrhage. No acute large vessel territory infarct. No mass lesion, midline shift or mass effect. No hydrocephalus or extra-axial fluid collection. Vascular: No hyperdense vessel. Calcified atherosclerosis present at the skull base. Skull: Scalp soft tissues and calvarium within normal limits. Sinuses/Orbits: Globes and orbital soft tissues demonstrate no acute finding. Visualized paranasal sinuses and mastoid air cells are clear. Other: None. ASPECTS [facility] Stroke Program Early CT Score) - Ganglionic level infarction (caudate, lentiform nuclei, internal capsule, insula, M1-M3 cortex): 7 - Supraganglionic infarction (M4-M6 cortex): 3 Total score (0-10 with 10 being normal): 10 IMPRESSION: 1. No acute intracranial infarct or other abnormality. 2. ASPECTS is 10. 3. Moderately advanced cerebral atrophy with chronic small vessel ischemic disease. Results were called by telephone at the time of interpretation on 08/13/2021 at 10:52 pm to provider [facility] , who verbally acknowledged these results. Electronically Signed   By: Jeannine Boga M.D.   On: 08/13/2021 22:52   [place] EKG SITE RITE  Result Date: 08/14/2021 If Site Rite image not attached, placement could not be confirmed due to current cardiac rhythm.    Medical Consultants:   None.   Subjective:    Colleen Lee more awake today she relates she is hungry.  Objective:    Vitals:   08/14/21 0910 08/14/21 1455 08/14/21 1700 08/15/21 0555  BP: 112/88 115/85 125/68 117/89  Pulse: 90 83 87 100  Resp: 20 20 20 16   Temp: 98.5 F (36.9 C) 98.3 F (36.8 C) 98 F (36.7 C) 98.2 F (36.8 C)  TempSrc: Oral Oral Axillary Oral  SpO2: 94% 97% 100% 100%  Weight:    57.6 kg  Height:       SpO2: 100 %   Intake/Output Summary (Last 24 hours) at  08/15/2021 0729 Last data filed at 08/15/2021 0500 Gross per 24 hour  Intake 479.41 ml  Output 300 ml  Net 179.41 ml   Filed Weights   08/14/21 0352 08/14/21 0510 08/15/21 0555  Weight: 63.5 kg 55.5 kg 57.6 kg    Exam: General exam: In no acute distress. Respiratory system: Good air movement and clear to auscultation. Cardiovascular system: S1 & S2 heard, RRR. No JVD. Gastrointestinal system: Abdomen is nondistended, soft and nontender.  Extremities: No pedal edema. Skin: No rashes, lesions or ulcers Psychiatry: Slow to respond judgment appears intact poor health insight   Data Reviewed:    Labs: Basic Metabolic Panel: Recent Labs  Lab 08/13/21 2250 08/13/21 2256 08/14/21 0342 08/14/21 1209 08/15/21 0447  NA 135 138 135  --  136  K 5.2* 5.1 4.3  --  4.2  CL 111 111 113*  --  110  CO2 19*  --  18*  --  18*  GLUCOSE 89 82 92  --  94  BUN 16 15 14   --  10  CREATININE 0.96 0.90 0.72  --  0.67  CALCIUM 10.2  --  9.4  --  9.7  MG  --   --  1.6* 2.9* 2.4  PHOS  --   --  1.7* 3.4 1.8*    GFR Estimated Creatinine Clearance: 62.9 mL/min (by C-G formula based on SCr of 0.67 mg/dL). Liver Function Tests: Recent Labs  Lab 08/13/21 2250 08/14/21 0342  AST 42* 34  ALT 18 16  ALKPHOS 256* 223*  BILITOT 1.1 1.0  PROT 6.4* 5.7*  ALBUMIN 2.0* 1.8*    No results for input(s): LIPASE, AMYLASE in the last 168 hours. Recent Labs  Lab 08/13/21 2250  AMMONIA 19    Coagulation profile Recent Labs  Lab 08/13/21 2250  INR 1.2    COVID-19 Labs  No results for input(s): DDIMER, FERRITIN, LDH, CRP in the last 72 hours.  Lab Results  Component Value Date   SARSCOV2NAA NEGATIVE 08/13/2021   SARSCOV2NAA NEGATIVE 08/04/2021   [place] NEGATIVE 07/13/2021   [place] NEGATIVE 07/09/2021    CBC: Recent Labs  Lab 08/13/21 2250 08/13/21 2256 08/14/21 0342  WBC 11.5*  --  11.2*  NEUTROABS 9.4*  --   --   HGB 9.5* 10.9* 8.8*  HCT 28.6* 32.0* 26.2*  MCV  102.5*  --  103.6*  PLT 225  --  207    Cardiac Enzymes: No results for input(s): CKTOTAL, CKMB, CKMBINDEX, TROPONINI in the last 168 hours. BNP (last 3 results) No results for input(s): PROBNP in the last 8760 hours. CBG: No results for input(s): GLUCAP in the last 168 hours. D-Dimer: No results for input(s): DDIMER in the last 72 hours. Hgb A1c: No results for input(s): HGBA1C in the last 72 hours. Lipid Profile: No results for input(s): CHOL, HDL, LDLCALC, TRIG, CHOLHDL, LDLDIRECT in the last 72 hours. Thyroid function studies: No results for input(s): TSH, T4TOTAL, T3FREE, THYROIDAB in the last 72 hours.  Invalid input(s): FREET3 Anemia work up: Recent Labs    08/14/21 0342  VITAMINB12 1,658*  FOLATE 5.5*   Sepsis Labs: Recent Labs  Lab 08/13/21 2250 08/14/21 0342  WBC 11.5* 11.2*    Microbiology Recent Results (from the past 240 hour(s))  Resp Panel by RT-PCR (Flu A&B, Covid) Nasopharyngeal Swab     Status: None   Collection Time: 08/13/21  9:54 PM   Specimen: Nasopharyngeal Swab; Nasopharyngeal(NP) swabs in vial transport medium  Result Value Ref Range Status   SARS Coronavirus 2 by RT PCR NEGATIVE NEGATIVE Final    Comment: (NOTE) SARS-CoV-2 target nucleic acids are NOT DETECTED.  The SARS-CoV-2 RNA is generally detectable in upper respiratory specimens during the acute phase of infection. The lowest concentration of SARS-CoV-2 viral copies this assay can detect is 138 copies/mL. A negative result does not preclude SARS-Cov-2 infection and should not be used as the sole basis for treatment or other patient management decisions. A negative result may occur with  improper specimen collection/handling, submission of specimen other than nasopharyngeal swab, presence of viral mutation(s) within the areas targeted by this assay, and inadequate number of viral copies(<138 copies/mL). A negative result must be combined with clinical observations, patient history,  and epidemiological information. The expected result is Negative.  Fact Sheet for Patients:  EntrepreneurPulse.com.au  Fact Sheet for Healthcare Providers:  IncredibleEmployment.be  This test is no t yet approved or cleared by the [place] FDA and  has been authorized for detection and/or diagnosis of SARS-CoV-2 by FDA under an Emergency Use Authorization (EUA). This EUA will remain  in effect (meaning this test can be used) for the duration of the COVID-19 declaration under Section 564(b)(1) of the Act,  21 U.S.C.section 360bbb-3(b)(1), unless the authorization is terminated  or revoked sooner.       Influenza A by PCR NEGATIVE NEGATIVE Final   Influenza B by PCR NEGATIVE NEGATIVE Final    Comment: (NOTE) The Xpert Xpress SARS-CoV-2/FLU/RSV plus assay is intended as an aid in the diagnosis of influenza from Nasopharyngeal swab specimens and should not be used as a sole basis for treatment. Nasal washings and aspirates are unacceptable for Xpert Xpress SARS-CoV-2/FLU/RSV testing.  Fact Sheet for Patients: EntrepreneurPulse.com.au  Fact Sheet for Healthcare Providers: IncredibleEmployment.be  This test is not yet approved or cleared by the [place] FDA and has been authorized for detection and/or diagnosis of SARS-CoV-2 by FDA under an Emergency Use Authorization (EUA). This EUA will remain in effect (meaning this test can be used) for the duration of the COVID-19 declaration under Section 564(b)(1) of the Act, 21 U.S.C. section 360bbb-3(b)(1), unless the authorization is terminated or revoked.  Performed at [facility], [address] [place], [place] [address]      Medications:    Chlorhexidine Gluconate Cloth  6 each Topical Daily   pantoprazole  40 mg Oral BID   sodium chloride flush  10-40 mL Intracatheter Q12H   thiamine  100 mg Oral Daily   Continuous Infusions:  dextrose 5 %  and 0.45% NaCl 75 mL/hr at 08/15/21 0648   thiamine injection 500 mg (08/14/21 2303)      LOS: 1 day   Charlynne Cousins  [facility]  08/15/2021, 7:29 AM

## 2021-08-16 ENCOUNTER — Inpatient Hospital Stay (HOSPITAL_COMMUNITY): Payer: Medicare Other

## 2021-08-16 LAB — BASIC METABOLIC PANEL
Anion gap: 5 (ref 5–15)
BUN: 5 mg/dL — ABNORMAL LOW (ref 8–23)
CO2: 20 mmol/L — ABNORMAL LOW (ref 22–32)
Calcium: 9.1 mg/dL (ref 8.9–10.3)
Chloride: 110 mmol/L (ref 98–111)
Creatinine, Ser: 0.55 mg/dL (ref 0.44–1.00)
GFR, Estimated: >60 mL/min (ref >60)
Glucose, Bld: 106 mg/dL — ABNORMAL HIGH (ref 70–99)
Potassium: 3 mmol/L — ABNORMAL LOW (ref 3.5–5.1)
Sodium: 135 mmol/L (ref 135–145)

## 2021-08-16 LAB — PHOSPHORUS: Phosphorus: 1.8 mg/dL — ABNORMAL LOW (ref 2.5–4.6)

## 2021-08-16 MED ORDER — POTASSIUM PHOSPHATES 15 MMOLE/5ML IV SOLN
30.0000 mmol | Freq: Once | INTRAVENOUS | Status: AC
  Administered 2021-08-16: 30 mmol via INTRAVENOUS
  Filled 2021-08-16: qty 10

## 2021-08-16 MED ORDER — METOPROLOL TARTRATE 25 MG PO TABS
25.0000 mg | ORAL_TABLET | Freq: Two times a day (BID) | ORAL | Status: DC
  Administered 2021-08-16: 25 mg via ORAL
  Filled 2021-08-16: qty 1

## 2021-08-16 MED ORDER — METOPROLOL TARTRATE 25 MG PO TABS
12.5000 mg | ORAL_TABLET | Freq: Two times a day (BID) | ORAL | Status: DC
  Administered 2021-08-16: 12.5 mg via ORAL
  Filled 2021-08-16: qty 1

## 2021-08-16 MED ORDER — METOPROLOL TARTRATE 25 MG PO TABS
25.0000 mg | ORAL_TABLET | Freq: Once | ORAL | Status: AC
  Administered 2021-08-16: 25 mg via ORAL
  Filled 2021-08-16: qty 1

## 2021-08-16 MED ORDER — MORPHINE SULFATE (PF) 4 MG/ML IV SOLN
4.0000 mg | INTRAVENOUS | Status: DC | PRN
  Administered 2021-08-16 – 2021-08-22 (×2): 4 mg via INTRAVENOUS
  Filled 2021-08-16 (×2): qty 1

## 2021-08-16 MED ORDER — ALBUTEROL SULFATE (2.5 MG/3ML) 0.083% IN NEBU
2.5000 mg | INHALATION_SOLUTION | RESPIRATORY_TRACT | Status: DC | PRN
  Administered 2021-08-16: 2.5 mg via RESPIRATORY_TRACT

## 2021-08-16 MED ORDER — METOPROLOL TARTRATE 5 MG/5ML IV SOLN
5.0000 mg | Freq: Four times a day (QID) | INTRAVENOUS | Status: DC | PRN
  Administered 2021-08-19 – 2021-08-21 (×2): 5 mg via INTRAVENOUS
  Filled 2021-08-16 (×2): qty 5

## 2021-08-16 MED ORDER — ALBUTEROL SULFATE HFA 108 (90 BASE) MCG/ACT IN AERS: 2.0000 | INHALATION_SPRAY | RESPIRATORY_TRACT | Status: DC

## 2021-08-16 MED ORDER — HYDROCODONE-ACETAMINOPHEN 5-325 MG PO TABS
1.0000 | ORAL_TABLET | ORAL | Status: DC | PRN
  Administered 2021-08-16: 1 via ORAL
  Administered 2021-08-16: 2 via ORAL
  Administered 2021-08-17 – 2021-08-18 (×4): 1 via ORAL
  Administered 2021-08-18 (×2): 2 via ORAL
  Administered 2021-08-18 (×2): 1 via ORAL
  Administered 2021-08-19 – 2021-08-21 (×10): 2 via ORAL
  Administered 2021-08-22 (×2): 1 via ORAL
  Administered 2021-08-22 – 2021-08-23 (×4): 2 via ORAL
  Filled 2021-08-16: qty 2
  Filled 2021-08-16 (×2): qty 1
  Filled 2021-08-16 (×4): qty 2
  Filled 2021-08-16: qty 1
  Filled 2021-08-16 (×2): qty 2
  Filled 2021-08-16: qty 1
  Filled 2021-08-16: qty 2
  Filled 2021-08-16 (×3): qty 1
  Filled 2021-08-16 (×3): qty 2
  Filled 2021-08-16: qty 1
  Filled 2021-08-16 (×5): qty 2
  Filled 2021-08-16: qty 1
  Filled 2021-08-16: qty 2

## 2021-08-16 MED ORDER — POTASSIUM CHLORIDE 20 MEQ PO PACK
40.0000 meq | PACK | Freq: Two times a day (BID) | ORAL | Status: AC
  Administered 2021-08-16 (×2): 40 meq via ORAL
  Filled 2021-08-16 (×2): qty 2

## 2021-08-16 NOTE — Progress Notes (Signed)
Albuterol tx completed, pt states her breathing is much easier. Chest remains clear, resp even and nonlabored, SaO2 98% on room air. Pt denies c/o at present. HR 125 bpm, family member states concern over increased HR. Advised pt's sister that albuterol does cause increased heart rate and that we are monitoring. Expectation is that HR will come down as medicine is metabolized and if remains elevated, MD has ordered PRN IV med for rate control. Pt and family members state understanding.

## 2021-08-16 NOTE — Evaluation (Signed)
Clinical/Bedside Swallow Evaluation Patient Details  Name: Colleen Lee MRN: [id number] Date of Birth: 07-Oct-1955  Today's Date: 08/16/2021 Time: SLP Start Time (ACUTE ONLY): 1228 SLP Stop Time (ACUTE ONLY): 1251 SLP Time Calculation (min) (ACUTE ONLY): 23 min  Past Medical History:  Past Medical History:  Diagnosis Date   Anemia    Asthma    Chronic abdominal pain    Chronic back pain    Chronic constipation    Cirrhosis (HCC)    Metavir score F4 on elastography 2015   Cyclical vomiting    Fibroids    Fibromyalgia    GERD (gastroesophageal reflux disease)    H. pylori infection 2014   treated with pylera, had to be treated again as it was not eradicated. Urea breath test then negative after subsequent treatment.    [name]    HCV RNA positive 09/2012   Hypertension    Marijuana use    Nausea and vomiting    chronic, recurrent   PONV (postoperative nausea and vomiting)    pt thinks maybe once she had N&V   Sciatica of left side    Past Surgical History:  Past Surgical History:  Procedure Laterality Date   BALLOON DILATION  12/20/2017   Procedure: BALLOON DILATION;  Surgeon: West Bali, MD;  [facility];  Service: Endoscopy;;  pyloric dilation   BIOPSY  12/20/2017   Procedure: BIOPSY;  Surgeon: West Bali, MD;  [facility];  Service: Endoscopy;;  duodenal and gastric biopsy   BIOPSY  07/15/2021   Procedure: BIOPSY;  Surgeon: Dolores Frame, MD;  [facility];  Service: Gastroenterology;;   COLONOSCOPY  01/18/2008   QMV:HQIONG rectum.  Long redundant colon, a diminutive sigmoid polyp status post cold biopsy removed. Hyperplastic polyp. Repeat colonoscopy June 2014 due to family history of colon cancer   COLONOSCOPY WITH ESOPHAGOGASTRODUODENOSCOPY (EGD) N/A 11/02/2012   EXB:[id number] gastric mucosa of doubtful, +H.pylori. Incomplete colonoscopy due to patient unable to tolerate exam, proximal colon seen. Patient  refused ACBE.   COLONOSCOPY WITH PROPOFOL N/A 03/08/2013   Dr. Jena Gauss: colonic polyp-removed as scribed above. Internal Hemorrhoids. Pathology did not reveal any colonic tissue, only mucus. SURVEILLANCE DUE Aug 2019   COLONOSCOPY WITH PROPOFOL N/A 01/19/2018   Dr. Jena Gauss: 6 mm cecal inflammatoy polyp, otherwise normal., Surveilance in 5 year s   ESOPHAGEAL DILATION  12/20/2017   Procedure: ESOPHAGEAL DILATION;  Surgeon: West Bali, MD;  [facility];  Service: Endoscopy;;   ESOPHAGOGASTRODUODENOSCOPY  01/18/2008   RMR: Normal esophagus, normal  stomach   ESOPHAGOGASTRODUODENOSCOPY (EGD) WITH PROPOFOL N/A 02/09/2016   Normal esophagus, small hiatal hernia, portal hypertensive gastropathy, normal second portion of duodenum. Due in July 2019   ESOPHAGOGASTRODUODENOSCOPY (EGD) WITH PROPOFOL N/A 12/20/2017   Dr. Darrick Penna: benign appearing esophageal stenosis s/p dilation, mild pyloric stenosis s/p biopsy and dilation, mild duodenitis.    ESOPHAGOGASTRODUODENOSCOPY (EGD) WITH PROPOFOL N/A 06/12/2020   Rourk: Normal esophagus, portal hypertensive gastropathy, no specimens collected.  Tentatively plan for repeat EGD in 2 years.   ESOPHAGOGASTRODUODENOSCOPY (EGD) WITH PROPOFOL N/A 07/15/2021   Procedure: ESOPHAGOGASTRODUODENOSCOPY (EGD) WITH PROPOFOL;  Surgeon: Dolores Frame, MD;  [facility];  Service: Gastroenterology;  Laterality: N/A;   HYSTEROSCOPY  05/11/2016   Procedure: HYSTEROSCOPY;  Surgeon: Tilda Burrow, MD;  Location: [facility];  Service: Gynecology;;   KNEE SURGERY     left knee   MULTIPLE EXTRACTIONS WITH ALVEOLOPLASTY N/A 10/15/2013  Procedure: MULTIPLE EXTRACTION WITH ALVEOLOPLASTY;  Surgeon: Georgia Lopes, DDS;  [facility];  Service: Oral Surgery;  Laterality: N/A;   POLYPECTOMY N/A 03/08/2013   Procedure: POLYPECTOMY;  Surgeon: Corbin Ade, MD;  [facility];  Service: Endoscopy;  Laterality: N/A;   POLYPECTOMY N/A 05/11/2016   Procedure:  REMOVAL OF ENDOMETRIAL POLYP;  Surgeon: Tilda Burrow, MD;  Location: [facility];  Service: Gynecology;  Laterality: N/A;   RESECTION DISTAL CLAVICAL Right 01/30/2019   Procedure: RESECTION DISTAL CLAVICAL;  Surgeon: Vickki Hearing, MD;  [facility]: [facility];  Service: Orthopedics;  Laterality: Right;   SHOULDER OPEN ROTATOR CUFF REPAIR Right 01/30/2019   Procedure: ROTATOR CUFF REPAIR SHOULDER OPEN;  Surgeon: Vickki Hearing, MD;  [facility]: [facility];  Service: Orthopedics;  Laterality: Right;  pt to arrive at 7:30 for PICC at 8:00   TOTAL KNEE ARTHROPLASTY Left 02/05/2015   Procedure: LEFT TOTAL KNEE ARTHROPLASTY;  Surgeon: Vickki Hearing, MD;  [facility]: [facility];  Service: Orthopedics;  Laterality: Left;   HPI:  Colleen Lee is an 66 y.o. female past medical history of alcoholic cirrhosis, [name] asthma comes into the ED via EMS for acute metabolic encephalopathy seen recently by her neurologist who thought her weakness was secondary to chronic liver disease alcohol abuse failure to thrive and thiamine deficiency, CT of the head showed no acute intracranial abnormalities.  In the setting of alcoholic cirrhosis, ammonia level was negative.  CT of the head showed advanced cerebral atrophy with chronic small vessel ischemic changes. She was started on IV fluids, folate we will give her high-dose IV thiamine. BSE requested. Last BSE 12./15/22 with recommendation for reg/thin.    Assessment / Plan / Recommendation  Clinical Impression  Pt known to this SLP from BSE completed in December with recommendation for regular textures and thin liquids. Pt had esohpageal esophagitis at that time. Pt's sister was present for BSE today and reported that pt was not alert enough for po intake yesterday, but has otherwise been doing fine. Oral motor exam is WNL, Pt with with sparse dentition. Pt consumed thin liquids and mech soft textures without incident. Ok to continue with current diet of D3/mech soft and thin  liquids with standard aspiration and reflux precautions and SLP will sign off. Pt and family in agreement with plan of care. SLP Visit Diagnosis: Dysphagia, unspecified (R13.10)    Aspiration Risk  No limitations    Diet Recommendation Dysphagia 3 (Mech soft);Thin liquid   Liquid Administration via: Cup;Straw Medication Administration: Whole meds with liquid Supervision: Patient able to self feed Postural Changes: Seated upright at 90 degrees;Remain upright for at least 30 minutes after po intake    Other  Recommendations Oral Care Recommendations: Oral care BID Other Recommendations: Clarify dietary restrictions    Recommendations for follow up therapy are one component of a multi-disciplinary discharge planning process, led by the attending physician.  Recommendations may be updated based on patient status, additional functional criteria and insurance authorization.  Follow up Recommendations No SLP follow up      Assistance Recommended at Discharge    Functional Status Assessment    Frequency and Duration            Prognosis Prognosis for Safe Diet Advancement: Good      Swallow Study   General Date of Onset: 08/16/21 HPI: Colleen Lee is an 66 y.o. female past medical history of alcoholic cirrhosis, [name] asthma comes into the ED  via EMS for acute metabolic encephalopathy seen recently by her neurologist who thought her weakness was secondary to chronic liver disease alcohol abuse failure to thrive and thiamine deficiency, CT of the head showed no acute intracranial abnormalities.  In the setting of alcoholic cirrhosis, ammonia level was negative.  CT of the head showed advanced cerebral atrophy with chronic small vessel ischemic changes. She was started on IV fluids, folate we will give her high-dose IV thiamine. BSE requested. Last BSE 12./15/22 with recommendation for reg/thin. Type of Study: Bedside Swallow Evaluation Previous Swallow Assessment: BSE 07/16/21,  reg/thin Diet Prior to this Study: Dysphagia 3 (soft);Thin liquids Temperature Spikes Noted: No Respiratory Status: Room air History of Recent Intubation: No Behavior/Cognition: Alert;Cooperative;Pleasant mood Oral Cavity Assessment: Within Functional Limits (coated tongue) Oral Care Completed by SLP: Recent completion by staff Oral Cavity - Dentition: Poor condition;Missing dentition Vision: Functional for self-feeding Self-Feeding Abilities: Able to feed self Patient Positioning: Upright in bed Baseline Vocal Quality: Normal Volitional Cough: Strong Volitional Swallow: Able to elicit    Oral/Motor/Sensory Function Overall Oral Motor/Sensory Function: Within functional limits   Ice Chips Ice chips: Within functional limits Presentation: Spoon   Thin Liquid Thin Liquid: Within functional limits Presentation: Self Fed;Straw    Nectar Thick Nectar Thick Liquid: Not tested   Honey Thick Honey Thick Liquid: Not tested   Puree Puree: Within functional limits Presentation: Spoon   Solid     Solid: Within functional limits Presentation: Spoon     Thank you,  Havery Moros, CCC-SLP [phone]  Narada Uzzle 08/16/2021,1:57 PM

## 2021-08-16 NOTE — Progress Notes (Signed)
[facility] PROGRESS NOTE    Progress Note  Colleen Lee  GXQ:[id number] DOB: 17-Dec-1955 DOA: 08/13/2021 PCP: Sandi Mariscal, MD     Brief Narrative:   Colleen Lee is an 66 y.o. female past medical history of alcoholic cirrhosis, [name] asthma comes into the ED via EMS for acute metabolic encephalopathy seen recently by her neurologist who thought her weakness was secondary to chronic liver disease alcohol abuse failure to thrive and thiamine deficiency, CT of the head showed no acute intracranial abnormalities. In the setting of alcoholic cirrhosis, ammonia level was negative.  CT of the head showed advanced cerebral atrophy with chronic small vessel ischemic changes. She was started on IV fluids, folate we will give her high-dose IV thiamine.   Assessment/Plan:   Acute metabolic encephalopathy: Megace use in the setting of cirrhosis can worsen her encephalopathy,  Discontinue Her GGT is significantly elevated, and her LFTs have stabilized which points to her ongoing chronic alcohol consumption. Her mentation is significantly improved. Consult palliative care. MRI of the brain is pending  Generalized  is pendingweakness: Secondary to above. Physical therapy evaluated the patient, she will need to go to short-term rehab. Family wants to take her home  Hyperkalemia: Resolved with IV fluid hydration. Basic metabolic panels pending  Hypomagnesemia: Likely due to alcohol abuse. Repleted now resolved.  Hypophosphatemia: Phosphorus was low and was repleted is pending this morning.  Elevated troponins: No chest pain have basically remained flat likely demand ischemia.  Macrocytic anemia: Likely due to alcohol abuse folate level is low she is currently on oral repletion. B12 level is >1000  Essential hypertension: Discontinue atenolol continue oral metoprolol as atenolol can cause fogginess and sleepiness.    Leukocytosis: She has remained afebrile, this morning  she is more awake.  DVT prophylaxis: lovenox Family Communication:none Status is: Observation  The patient will require care spanning > 2 midnights and should be moved to inpatient because: Acute hepatic encephalopathy       Code Status:     Code Status Orders  (From admission, onward)           Start     Ordered   08/14/21 0223  Full code  Continuous        08/14/21 0222           Code Status History     Date Active Date Inactive Code Status Order ID Comments User Context   07/13/2021 2155 07/16/2021 2303 Full Code [id number]  Bethena Roys, MD Inpatient   05/18/2021 0004 05/22/2021 1927 Full Code [id number]  Mullin, Trainer, DO ED   01/03/2021 1915 01/05/2021 2303 Full Code [id number]  Bethena Roys, MD Inpatient   10/17/2019 2204 10/20/2019 1951 Full Code [id number]  Etta Quill, DO ED   01/28/2018 2102 01/30/2018 1715 Full Code [id number]  Truett Mainland, DO ED   12/15/2017 1750 12/22/2017 2122 Full Code [id number]  Murlean Iba, MD Inpatient   06/10/2016 1305 06/13/2016 1404 Full Code [id number]  Velvet Bathe, MD Inpatient   06/08/2016 2304 06/09/2016 1816 Full Code [id number]  Vianne Bulls, MD Inpatient   02/05/2015 1219 02/08/2015 1631 Full Code [id number]  Carole Civil, MD Inpatient         IV Access:   Peripheral IV   Procedures and diagnostic studies:   [place] EKG SITE RITE  Result Date: 08/14/2021 If Site Rite image not attached, placement could not be confirmed due to current cardiac rhythm.  Medical Consultants:   None.   Subjective:    Colleen Lee weight today has no new complaints.  Objective:    Vitals:   08/15/21 0555 08/15/21 1317 08/15/21 2046 08/16/21 0540  BP: 117/89 (!) 154/106 (!) 144/94 (!) 149/93  Pulse: 100 (!) 106 99 (!) 110  Resp: 16 18 19 18   Temp: 98.2 F (36.8 C) 98 F (36.7 C) 98 F (36.7 C) 98.6 F (37 C)  TempSrc: Oral Oral Oral   SpO2: 100% 100% 100% 100%  Weight: 57.6 kg      Height:       SpO2: 100 %   Intake/Output Summary (Last 24 hours) at 08/16/2021 0724 Last data filed at 08/16/2021 0500 Gross per 24 hour  Intake 1136.35 ml  Output 1402 ml  Net -265.65 ml    Filed Weights   08/14/21 0352 08/14/21 0510 08/15/21 0555  Weight: 63.5 kg 55.5 kg 57.6 kg    Exam: General exam: In no acute distress. Respiratory system: Good air movement and clear to auscultation. Cardiovascular system: S1 & S2 heard, RRR. No JVD. Gastrointestinal system: Abdomen is nondistended, soft and nontender.  Extremities: No pedal edema. Skin: No rashes, lesions or ulcers Psychiatry: Slow to respond judgment appears intact poor health insight   Data Reviewed:    Labs: Basic Metabolic Panel: Recent Labs  Lab 08/13/21 2250 08/13/21 2256 08/14/21 0342 08/14/21 1209 08/15/21 0447  NA 135 138 135  --  136  K 5.2* 5.1 4.3  --  4.2  CL 111 111 113*  --  110  CO2 19*  --  18*  --  18*  GLUCOSE 89 82 92  --  94  BUN 16 15 14   --  10  CREATININE 0.96 0.90 0.72  --  0.67  CALCIUM 10.2  --  9.4  --  9.7  MG  --   --  1.6* 2.9* 2.4  PHOS  --   --  1.7* 3.4 1.8*    GFR Estimated Creatinine Clearance: 62.9 mL/min (by C-G formula based on SCr of 0.67 mg/dL). Liver Function Tests: Recent Labs  Lab 08/13/21 2250 08/14/21 0342  AST 42* 34  ALT 18 16  ALKPHOS 256* 223*  BILITOT 1.1 1.0  PROT 6.4* 5.7*  ALBUMIN 2.0* 1.8*    No results for input(s): LIPASE, AMYLASE in the last 168 hours. Recent Labs  Lab 08/13/21 2250  AMMONIA 19    Coagulation profile Recent Labs  Lab 08/13/21 2250  INR 1.2    COVID-19 Labs  No results for input(s): DDIMER, FERRITIN, LDH, CRP in the last 72 hours.  Lab Results  Component Value Date   SARSCOV2NAA NEGATIVE 08/13/2021   SARSCOV2NAA NEGATIVE 08/04/2021   [place] NEGATIVE 07/13/2021   [place] NEGATIVE 07/09/2021    CBC: Recent Labs  Lab 08/13/21 2250 08/13/21 2256 08/14/21 0342  WBC 11.5*  --  11.2*   NEUTROABS 9.4*  --   --   HGB 9.5* 10.9* 8.8*  HCT 28.6* 32.0* 26.2*  MCV 102.5*  --  103.6*  PLT 225  --  207    Cardiac Enzymes: No results for input(s): CKTOTAL, CKMB, CKMBINDEX, TROPONINI in the last 168 hours. BNP (last 3 results) No results for input(s): PROBNP in the last 8760 hours. CBG: No results for input(s): GLUCAP in the last 168 hours. D-Dimer: No results for input(s): DDIMER in the last 72 hours. Hgb A1c: No results for input(s): HGBA1C in the last 72 hours. Lipid Profile:  No results for input(s): CHOL, HDL, LDLCALC, TRIG, CHOLHDL, LDLDIRECT in the last 72 hours. Thyroid function studies: No results for input(s): TSH, T4TOTAL, T3FREE, THYROIDAB in the last 72 hours.  Invalid input(s): FREET3 Anemia work up: Recent Labs    08/14/21 0342  VITAMINB12 1,658*  FOLATE 5.5*    Sepsis Labs: Recent Labs  Lab 08/13/21 2250 08/14/21 0342  WBC 11.5* 11.2*    Microbiology Recent Results (from the past 240 hour(s))  Resp Panel by RT-PCR (Flu A&B, Covid) Nasopharyngeal Swab     Status: None   Collection Time: 08/13/21  9:54 PM   Specimen: Nasopharyngeal Swab; Nasopharyngeal(NP) swabs in vial transport medium  Result Value Ref Range Status   SARS Coronavirus 2 by RT PCR NEGATIVE NEGATIVE Final    Comment: (NOTE) SARS-CoV-2 target nucleic acids are NOT DETECTED.  The SARS-CoV-2 RNA is generally detectable in upper respiratory specimens during the acute phase of infection. The lowest concentration of SARS-CoV-2 viral copies this assay can detect is 138 copies/mL. A negative result does not preclude SARS-Cov-2 infection and should not be used as the sole basis for treatment or other patient management decisions. A negative result may occur with  improper specimen collection/handling, submission of specimen other than nasopharyngeal swab, presence of viral mutation(s) within the areas targeted by this assay, and inadequate number of viral copies(<138 copies/mL).  A negative result must be combined with clinical observations, patient history, and epidemiological information. The expected result is Negative.  Fact Sheet for Patients:  EntrepreneurPulse.com.au  Fact Sheet for Healthcare Providers:  IncredibleEmployment.be  This test is no t yet approved or cleared by the [place] FDA and  has been authorized for detection and/or diagnosis of SARS-CoV-2 by FDA under an Emergency Use Authorization (EUA). This EUA will remain  in effect (meaning this test can be used) for the duration of the COVID-19 declaration under Section 564(b)(1) of the Act, 21 U.S.C.section 360bbb-3(b)(1), unless the authorization is terminated  or revoked sooner.       Influenza A by PCR NEGATIVE NEGATIVE Final   Influenza B by PCR NEGATIVE NEGATIVE Final    Comment: (NOTE) The Xpert Xpress SARS-CoV-2/FLU/RSV plus assay is intended as an aid in the diagnosis of influenza from Nasopharyngeal swab specimens and should not be used as a sole basis for treatment. Nasal washings and aspirates are unacceptable for Xpert Xpress SARS-CoV-2/FLU/RSV testing.  Fact Sheet for Patients: EntrepreneurPulse.com.au  Fact Sheet for Healthcare Providers: IncredibleEmployment.be  This test is not yet approved or cleared by the [place] FDA and has been authorized for detection and/or diagnosis of SARS-CoV-2 by FDA under an Emergency Use Authorization (EUA). This EUA will remain in effect (meaning this test can be used) for the duration of the COVID-19 declaration under Section 564(b)(1) of the Act, 21 U.S.C. section 360bbb-3(b)(1), unless the authorization is terminated or revoked.  Performed at [facility], [address], [place], [place] [address]      Medications:    amLODipine  5 mg Oral Daily   Chlorhexidine Gluconate Cloth  6 each Topical Daily   lactulose  10 g Oral BID   pantoprazole  40  mg Oral BID   sodium chloride flush  10-40 mL Intracatheter Q12H   thiamine  100 mg Oral Daily   Continuous Infusions:  dextrose 5 % and 0.45% NaCl 10 mL/hr at 08/15/21 1942   thiamine injection 500 mg (08/15/21 2119)      LOS: 2 days   Charlynne Cousins  [facility]  08/16/2021, 7:24 AM

## 2021-08-16 NOTE — Progress Notes (Signed)
°   08/16/21 1115  Vitals  BP (!) 142/92  MAP (mmHg) 107  BP Location Left Leg  BP Method Automatic  Patient Position (if appropriate) Sitting  Pulse Rate (!) 134  Pulse Rate Source Apical  Resp 18  Level of Consciousness  Level of Consciousness Alert  MEWS COLOR  MEWS Score Color Yellow  Oxygen Therapy  SpO2 100 %  O2 Device Room Air  Pain Assessment  Pain Scale 0-10  Pain Score 10  Pain Type Acute pain  Pain Location Buttocks  Pain Intervention(s) Medication (See eMAR)  MEWS Score  MEWS Temp 0  MEWS Systolic 0  MEWS Pulse 3  MEWS RR 0  MEWS LOC 0  MEWS Score 3   Pt called out for pain medication, MD notified and order received. In to obtain VS prior to admin of am meds and noted above readings. Heart rate verified apically. Pt with no c/o SOB or chest pain, skin warm and dry, pt only complains of lower back and  buttocks pain, specificially at area of stg 2 sacral wound. MD Feliz-Ortiz notified of current VS via amion page. Scheduled meds given, pain med administered. Pt turned and repositioned and new dressing applied to sacral wound. Pt states immediate improvement in pain with reposition and dressing change. Pt took oral meds without difficulty. Apical pulse rechecked and noted to be 115-117, occasionally irregular.

## 2021-08-16 NOTE — Progress Notes (Signed)
°   08/16/21 1348  Vitals  Temp 98.1 F (36.7 C)  Temp Source Oral  BP (!) 138/96  MAP (mmHg) 106  BP Location Left Leg  BP Method Automatic  Patient Position (if appropriate) Lying  Pulse Rate (!) 118  Pulse Rate Source Dinamap  Resp 16  Level of Consciousness  Level of Consciousness Alert  MEWS COLOR  MEWS Score Color Yellow  Oxygen Therapy  SpO2 100 %  O2 Device Room Air  Pain Assessment  Pain Scale 0-10  Pain Score 2  MEWS Score  MEWS Temp 0  MEWS Systolic 0  MEWS Pulse 2  MEWS RR 0  MEWS LOC 0  MEWS Score 2   Pt resting comfortably at this time, states buttocks pain, "is so much better now!". HR remains elevated, other VSS. Pt denies any c/o SOB or chest pain, skin warm and dry. IVF continue per order via dual lumen PICC line. MD Aileen Fass updated on pt status.

## 2021-08-16 NOTE — Progress Notes (Signed)
°   08/16/21 1831  Vitals  BP (!) 146/102  MAP (mmHg) 114  BP Location Left Leg  BP Method Automatic  Patient Position (if appropriate) Lying  Pulse Rate (!) 118  Pulse Rate Source Dinamap  Resp (!) 21  MEWS COLOR  MEWS Score Color Yellow  Oxygen Therapy  SpO2 100 %  O2 Device Room Air  MEWS Score  MEWS Temp 0  MEWS Systolic 0  MEWS Pulse 2  MEWS RR 1  MEWS LOC 0  MEWS Score 3   Pt's family called out to nurses station stating pt was having trouble breathing. Pt assessed, A&O, resp even and nonlabored, chest clear to auscultation. Pt states, "I got asthma!". Pt's sister states that pt uses albuterol inhaler at home and she needs a puff now. Pt's heart rate remains elevated, as well as B/P. MD Feliz-Ortiz notified of curren VS and c/o via [place] page. Orders received. Respiratory Therapy called for albuterol treatment. Pt placed on telemetry due to continued elevated and irregular heart rate. Bedside pt report given to Suella Grove, RN, all above information relayed to oncoming nurse, patient, and family members at bedside. Pt and family members state understanding.

## 2021-08-17 ENCOUNTER — Inpatient Hospital Stay (HOSPITAL_COMMUNITY): Payer: Medicare Other

## 2021-08-17 ENCOUNTER — Other Ambulatory Visit (HOSPITAL_COMMUNITY): Payer: Self-pay

## 2021-08-17 ENCOUNTER — Inpatient Hospital Stay (HOSPITAL_COMMUNITY)
Admit: 2021-08-17 | Discharge: 2021-08-17 | Disposition: A | Discharge status: Home / Self Care | Payer: Medicare Other | Attending: Neurology | Admitting: Neurology

## 2021-08-17 ENCOUNTER — Encounter (HOSPITAL_COMMUNITY): Payer: Self-pay | Admitting: Internal Medicine

## 2021-08-17 DIAGNOSIS — R627 Adult failure to thrive: Secondary | ICD-10-CM

## 2021-08-17 DIAGNOSIS — Z515 Encounter for palliative care: Secondary | ICD-10-CM

## 2021-08-17 DIAGNOSIS — I6389 Other cerebral infarction: Secondary | ICD-10-CM

## 2021-08-17 DIAGNOSIS — D72829 Elevated white blood cell count, unspecified: Secondary | ICD-10-CM

## 2021-08-17 DIAGNOSIS — E43 Unspecified severe protein-calorie malnutrition: Secondary | ICD-10-CM

## 2021-08-17 DIAGNOSIS — I63541 Cerebral infarction due to unspecified occlusion or stenosis of right cerebellar artery: Secondary | ICD-10-CM

## 2021-08-17 DIAGNOSIS — Z7189 Other specified counseling: Secondary | ICD-10-CM

## 2021-08-17 LAB — BASIC METABOLIC PANEL
Anion gap: 5 (ref 5–15)
BUN: <5 mg/dL — ABNORMAL LOW (ref 8–23)
CO2: 18 mmol/L — ABNORMAL LOW (ref 22–32)
Calcium: 9.2 mg/dL (ref 8.9–10.3)
Chloride: 112 mmol/L — ABNORMAL HIGH (ref 98–111)
Creatinine, Ser: 0.59 mg/dL (ref 0.44–1.00)
GFR, Estimated: >60 mL/min (ref >60)
Glucose, Bld: 95 mg/dL (ref 70–99)
Potassium: 4.5 mmol/L (ref 3.5–5.1)
Sodium: 135 mmol/L (ref 135–145)

## 2021-08-17 LAB — ECHOCARDIOGRAM COMPLETE
AR max vel: 2.48 cm2
AV Area VTI: 2.2 cm2
AV Area mean vel: 2.42 cm2
AV Mean grad: 3 mmHg
AV Peak grad: 5.6 mmHg
Ao pk vel: 1.18 m/s
Area-P 1/2: 5.97 cm2
Calc EF: 59.1 %
Height: 66 in
MV VTI: 2.54 cm2
S' Lateral: 2 cm
Single Plane A2C EF: 59.5 %
Single Plane A4C EF: 59.4 %
Weight: 2031.76 oz

## 2021-08-17 LAB — PHOSPHORUS: Phosphorus: 2.2 mg/dL — ABNORMAL LOW (ref 2.5–4.6)

## 2021-08-17 MED ORDER — IOHEXOL 350 MG/ML SOLN
75.0000 mL | Freq: Once | INTRAVENOUS | Status: AC | PRN
  Administered 2021-08-17: 100 mL via INTRAVENOUS

## 2021-08-17 MED ORDER — ADULT MULTIVITAMIN W/MINERALS CH
1.0000 | ORAL_TABLET | Freq: Every day | ORAL | Status: DC
  Administered 2021-08-17 – 2021-08-23 (×6): 1 via ORAL
  Filled 2021-08-17 (×6): qty 1

## 2021-08-17 MED ORDER — K PHOS MONO-SOD PHOS DI & MONO 155-852-130 MG PO TABS
500.0000 mg | ORAL_TABLET | Freq: Two times a day (BID) | ORAL | Status: DC
  Administered 2021-08-17: 500 mg via ORAL
  Filled 2021-08-17: qty 2

## 2021-08-17 MED ORDER — K PHOS MONO-SOD PHOS DI & MONO 155-852-130 MG PO TABS
500.0000 mg | ORAL_TABLET | Freq: Four times a day (QID) | ORAL | Status: DC
  Administered 2021-08-17 – 2021-08-19 (×9): 500 mg via ORAL
  Filled 2021-08-17 (×9): qty 2

## 2021-08-17 MED ORDER — METOPROLOL TARTRATE 50 MG PO TABS
50.0000 mg | ORAL_TABLET | Freq: Two times a day (BID) | ORAL | Status: DC
  Administered 2021-08-17 – 2021-08-23 (×12): 50 mg via ORAL
  Filled 2021-08-17 (×13): qty 1

## 2021-08-17 MED ORDER — FOLIC ACID 1 MG PO TABS
1.0000 mg | ORAL_TABLET | Freq: Every day | ORAL | Status: DC
  Administered 2021-08-17 – 2021-08-23 (×7): 1 mg via ORAL
  Filled 2021-08-17 (×7): qty 1

## 2021-08-17 MED ORDER — STROKE: EARLY STAGES OF RECOVERY BOOK: Freq: Once | Status: AC

## 2021-08-17 NOTE — TOC Progression Note (Signed)
Transition of Care [facility]) - Progression Note    Patient Details  Name: Colleen Lee MRN: [id number] Date of Birth: [birth year]  [facility] CM/SW Contact  Shade Flood, LCSW Phone Number: 08/17/2021, 1:59 PM  Clinical Narrative:     TOC following. 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 DC timeframe not yet known. Spoke with pt's sister today to review dc planning. At this time, sister states that they are still planning to take her home but that they are considering the 20 day rehab and may end up accepting that option. DME orders are entered and TOC will arrange for delivery if pt/family end up finalizing decision to go home at dc.  TOC placed call to DSS worker, [place], [phone]) at sister request regarding CAP application status. Voicemail message requesting return call was left for [place].  Will continue to follow.  Expected Discharge Plan: [place] Barriers to Discharge: Continued Medical Work up  Expected Discharge Plan and Services Expected Discharge Plan: [place] In-house Referral: Clinical Social Work   Post Acute Care Choice: Resumption of Svcs/PTA Provider, Durable Medical Equipment Living arrangements for the past 2 months: Single Family Home                                       Social Determinants of Health (SDOH) Interventions    Readmission Risk Interventions Readmission Risk Prevention Plan 08/14/2021 07/16/2021 05/19/2021  Transportation Screening Complete Complete Complete  HRI or Home Care Consult - - Complete  Social Work Consult for [place] Planning/Counseling - - Complete  Palliative Care Screening - - Not Applicable  Medication Review Press photographer) Complete Complete Complete  HRI or Home Care Consult Complete Complete -  SW Recovery Care/Counseling Consult Complete Complete -  Palliative Care Screening - Not Applicable -  [place] Complete  Complete -  Some recent data might be hidden

## 2021-08-17 NOTE — Progress Notes (Signed)
°   08/17/21 0912  Assess: MEWS Score  Temp 98.4 F (36.9 C)  BP (!) 122/94  Pulse Rate (!) 117  Resp 16  SpO2 100 %  O2 Device Room Air  Assess: MEWS Score  MEWS Temp 0  MEWS Systolic 0  MEWS Pulse 2  MEWS RR 0  MEWS LOC 0  MEWS Score 2  MEWS Score Color Yellow  Assess: if the MEWS score is Yellow or Red  Were vital signs taken at a resting state? Yes  Early Detection of Sepsis Score *See Row Information* Low  MEWS guidelines implemented *See Row Information* Yes  Treat  MEWS Interventions Administered scheduled meds/treatments (MD increased her metoprol to 50 mg and she received her first dose after these vitals were taken)  Take Vital Signs  Increase Vital Sign Frequency  Yellow: Q 2hr X 2 then Q 4hr X 2, if remains yellow, continue Q 4hrs  Escalate  MEWS: Escalate Yellow: discuss with charge nurse/RN and consider discussing with provider and RRT  Notify: Charge Nurse/RN  Name of Charge Nurse/RN Notified Condon  Date Charge Nurse/RN Notified 08/17/21  Time Charge Nurse/RN Notified 0930  Notify: Provider  Provider Name/Title Feliz-Ortiz  Date Provider Notified 08/17/21  Time Provider Notified 0930  Notification Type Page  Notification Reason  (pulse)  Provider response  (administer new dose metorprolol)  Date of Provider Response 08/17/21  Time of Provider Response 0930  Document  Patient Outcome Stabilized after interventions  Progress note created (see row info) Yes

## 2021-08-17 NOTE — Progress Notes (Signed)
EEG completed, results pending. 

## 2021-08-17 NOTE — Progress Notes (Signed)
SLP Cancellation Note  Patient Details Name: Colleen Lee MRN: [id number] DOB: 1955-08-16   Cancelled treatment:       Reason Eval/Treat Not Completed: Patient at procedure or test/unavailable;Other (comment); SLP saw Pt yesterday for BSE and discharged Pt. New consult received today for SLE due to MRI this AM showing acute right cerebellar infarct. Pt with mild dysarthria and cognitive impairment. SLP unable to see Pt at this time due to echo being performed in room. Recommend f/u SLP services at SNF, if Pt discharges prior to SLP visit here. Ok to continue diet as ordered.   Thank you,  Genene Churn, [place]    Villalba 08/17/2021, 2:19 PM

## 2021-08-17 NOTE — Consult Note (Signed)
Consultation Note Date: 08/17/2021   Patient Name: Colleen Lee  DOB: [birth year]  MRN: [id number]  Age / Sex: 66 y.o., female  PCP: Sandi Mariscal, MD Referring Physician: Aileen Fass, Tammi Klippel, MD  Reason for Consultation: Establishing goals of care  HPI/Patient Profile: 66 y.o. female  with past medical history of alcoholic liver cirrhosis, hep C treated, asthma, failure to thrive, chronic alcohol abuse in remission, thiamine deficiency, fibromyalgia, chronic constipation, history of H. pylori, anemia, chronic back pain admitted on 08/13/2021 with acute metabolic encephalopathy/acute stroke.   Clinical Assessment and Goals of Care: I have reviewed medical records including EPIC notes, labs and imaging, received report from RN, assessed the patient.  [name], [name], is lying quietly in bed.  She appears acutely/chronically ill and quite frail.  She will briefly make but not keep eye contact.  She is oriented to person and situation, but not month.  I am not sure that she can make her basic needs known.  As we are beginning of our visit, her brother, Quillian Quince arrives and we meet to discuss diagnosis prognosis, [place], EOL wishes, disposition and options.  I introduced Palliative Medicine as specialized medical care for people living with serious illness. It focuses on providing relief from the symptoms and stress of a serious illness. The goal is to improve quality of life for both the patient and the family.  We discussed a brief life review of the patient.  Quillian Quince shares that he moved in with [name] around Thanksgiving because family felt that she had needs.  Quillian Quince shares that he has seen a big change in her over the last 2 weeks.  He shares that she spends most of her day, and 90%, in bed resting.  Quillian Quince shares that [name] has been sober for about 2 years, but she went on a binge drinking episode about 8 months ago.  He shares  that she has been sober for 8 months.  We then focused on their current illness.  We review MRI results showing new acute stroke.  We talked about neurology consult and the treatment plan.  We talked about time for outcomes.  The natural disease trajectory and expectations at EOL were discussed.  Advanced directives, concepts specific to code status, were considered and discussed.  We talked about the concept of "treat the treatable but allowing natural passing".  Quillian Quince states that he will discuss this with their sister living who is healthcare power of attorney.  Discussed the importance of continued conversation with family and the medical providers regarding overall plan of care and treatment options, ensuring decisions are within the context of the patients values and GOCs. Questions and concerns were addressed.  The family was encouraged to call with questions or concerns.  PMT will continue to support holistically.  Conference with attending, bedside nursing staff, transition of care team related to patient condition, needs, goals of care, disposition.  PMT to continue to follow.   HCPOA  NEXT OF KIN -Brother Quillian Quince states that Sister [name] is healthcare  power of attorney.  [name] is married, but family states that he has PTSD, and is unable to participate in her care/decision-making.  Ms. Godbee has no children, her mother and father are deceased.  She is 1 of 11 children, 3 survive.    SUMMARY OF RECOMMENDATIONS   At this point continue to treat the treatable We discussed CODE STATUS, treat the treatable but allowing natural passing Time for outcomes   Code Status/Advance Care Planning: Full code -we discussed the concept of "treat the treatable but allowing natural passing".  Brother Quillian Quince states that he will discuss with [employer].  Quillian Quince states that they are experienced with the passing of siblings  Symptom Management:  Per hospitalist, no additional needs at this  time.  Palliative Prophylaxis:  Oral Care and Turn Reposition  Additional Recommendations (Limitations, Scope, Preferences): Full Scope Treatment  Psycho-social/Spiritual:  Desire for further Chaplaincy support:no Additional Recommendations: Caregiving  Support/Resources  Prognosis:  Unable to determine, based on outcomes.  Guarded at this point due to new acute stroke.  Discharge Planning:  To be determined, based on outcomes/qualifications/needs.       Primary Diagnoses: Present on Admission:  Acute metabolic encephalopathy  Leukocytosis   I have reviewed the medical record, interviewed the patient and family, and examined the patient. The following aspects are pertinent.  Past Medical History:  Diagnosis Date   Anemia    Asthma    Chronic abdominal pain    Chronic back pain    Chronic constipation    Cirrhosis (HCC)    Metavir score F4 on elastography 0370   Cyclical vomiting    Fibroids    Fibromyalgia    GERD (gastroesophageal reflux disease)    H. pylori infection 2014   treated with pylera, had to be treated again as it was not eradicated. Urea breath test then negative after subsequent treatment.    [name]    HCV RNA positive 09/2012   Hypertension    Marijuana use    Nausea and vomiting    chronic, recurrent   PONV (postoperative nausea and vomiting)    pt thinks maybe once she had N&V   Sciatica of left side    Social History   Socioeconomic History   Marital status: Married    Spouse name: Not on file   Number of children: 0   Years of education: Not on file   Highest education level: 11th grade  Occupational History   Occupation: umemployed   Tobacco Use   Smoking status: Every Day    Packs/day: 1.00    Years: 40.00    Pack years: 40.00    Types: Cigarettes   Smokeless tobacco: Never   Tobacco comments:    Smokes one pack of cigarettes every 1.5 days  Vaping Use   Vaping Use: Never used  Substance and Sexual Activity   Alcohol  use: Not Currently    Comment: 1 bottle of dinner wine daily; 06/30/21 stopped drinking   Drug use: Not Currently    Types: Marijuana    Comment: history of marijuana in past- almost 2 years ago   Sexual activity: Never    Birth control/protection: None  Other Topics Concern   Not on file  Social History Narrative   08/12/21 lives with brother, sister [employer] helps and manages meds   Social Determinants of Health   Financial Resource Strain: Not on file  Food Insecurity: No Food Insecurity   Worried About Charity fundraiser in  the Last Year: Never true   Ran Out of Food in the Last Year: Never true  Transportation Needs: No Transportation Needs   Lack of Transportation (Medical): No   Lack of Transportation (Non-Medical): No  Physical Activity: Not on file  Stress: Not on file  Social Connections: Not on file   Family History  Problem Relation Age of Onset   Cancer Mother        breast   Asthma Mother    Liver cancer Sister    Lung cancer Sister    Colon cancer Brother 34           Multiple myeloma Brother    Prostate cancer Brother    Pancreatic cancer Brother    Scheduled Meds:   stroke: mapping our early stages of recovery book   Does not apply Once   amLODipine  5 mg Oral Daily   Chlorhexidine Gluconate Cloth  6 each Topical Daily   folic acid  1 mg Oral Daily   lactulose  10 g Oral BID   metoprolol tartrate  50 mg Oral BID   multivitamin with minerals  1 tablet Oral Daily   pantoprazole  40 mg Oral BID   phosphorus  500 mg Oral QID   sodium chloride flush  10-40 mL Intracatheter Q12H   thiamine  100 mg Oral Daily   Continuous Infusions:  dextrose 5 % and 0.45% NaCl 10 mL/hr at 08/17/21 0916   PRN Meds:.albuterol, HYDROcodone-acetaminophen, metoprolol tartrate, morphine injection, sodium chloride flush Medications Prior to Admission:  Prior to Admission medications   Medication Sig Start Date End Date Taking? Authorizing Provider  albuterol (VENTOLIN HFA)  108 (90 Base) MCG/ACT inhaler Inhale 2 puffs into the lungs every 6 (six) hours as needed for wheezing or shortness of breath. 07/16/21  Yes Emokpae, Courage, MD  allopurinol (ZYLOPRIM) 300 MG tablet Take 300 mg by mouth daily. 04/15/21  Yes [provider]  atenolol (TENORMIN) 25 MG tablet Take 1 tablet (25 mg total) by mouth 2 (two) times daily. 07/16/21 07/16/22 Yes Emokpae, Courage, MD  Cholecalciferol (DIALYVITE VITAMIN D3 MAX) 1.25 MG (50000 UT) TABS Take 1 tablet by mouth every 7 (seven) days.   Yes [provider]  cyclobenzaprine (FLEXERIL) 5 MG tablet Take 5 mg by mouth 3 (three) times daily. 04/15/21  Yes [provider]  folic acid (FOLVITE) 1 MG tablet Take 1 tablet (1 mg total) by mouth daily. 01/06/21  Yes Barton Dubois, MD  gabapentin (NEURONTIN) 100 MG capsule Take 1 capsule (100 mg total) by mouth 3 (three) times daily. Patient taking differently: Take 100 mg by mouth daily. 12/22/18  Yes Carole Civil, MD  HYDROcodone-acetaminophen (NORCO/VICODIN) 5-325 MG tablet Take 1 tablet by mouth every 6 (six) hours as needed. Patient taking differently: Take 1 tablet by mouth every 6 (six) hours as needed for moderate pain. 06/08/21  Yes Noemi Chapel, MD  hydrOXYzine (ATARAX) 10 MG tablet Take 1 tablet (10 mg total) by mouth daily as needed. Patient taking differently: Take 10 mg by mouth daily as needed for anxiety. 07/16/21  Yes Emokpae, Courage, MD  lactulose (CHRONULAC) 10 GM/15ML solution Take 30 mLs (20 g total) by mouth 2 (two) times daily as needed for mild constipation or moderate constipation. 07/16/21  Yes Emokpae, Courage, MD  megestrol (MEGACE) 40 MG tablet Take 1 tablet (40 mg total) by mouth daily. 07/16/21  Yes Emokpae, Courage, MD  naloxone (NARCAN) nasal spray 4 mg/0.1 mL See admin instructions.  ADMINISTER A SINGLE SPRAYEOF NARCAN INTO ONECNOSTRIL. REPEAT AFTER 3SMINUTES IN OTHER NOSTRIL IF NO RESPONSE. 10/03/20  Yes [provider]   ondansetron (ZOFRAN) 4 MG tablet Take 1 tablet (4 mg total) by mouth every 6 (six) hours as needed for nausea. 07/16/21  Yes Emokpae, Courage, MD  pantoprazole (PROTONIX) 40 MG tablet Take 1 tablet (40 mg total) by mouth 2 (two) times daily. 07/16/21 07/16/22 Yes Emokpae, Courage, MD  Plecanatide (TRULANCE) 3 MG TABS Take 3 mg by mouth daily. 06/30/21 06/30/22 Yes Rourk, Cristopher Estimable, MD  potassium chloride (MICRO-K) 10 MEQ CR capsule Take 20 mEq by mouth daily. 05/30/21  Yes [provider]  sucralfate (CARAFATE) 1 g tablet Take 1 tablet (1 g total) by mouth 4 (four) times daily -  with meals and at bedtime for 7 days. Patient not taking: Reported on 08/04/2021 07/16/21 07/23/21  Roxan Hockey, MD  thiamine 100 MG tablet Take 1 tablet (100 mg total) by mouth daily. Patient not taking: Reported on 08/14/2021 01/06/21   Barton Dubois, MD   Allergies  Allergen Reactions   Penicillins Rash    Did it involve swelling of the face/tongue/throat, SOB, or low BP? No Did it involve sudden or severe rash/hives, skin peeling, or any reaction on the inside of your mouth or nose? No Did you need to seek medical attention at a hospital or doctor's office? No When did it last happen? Many years ago      If all above answers are NO, may proceed with cephalosporin use.     Review of Systems  Unable to perform ROS: Acuity of condition   Physical Exam Vitals and nursing note reviewed.  Constitutional:      General: She is not in acute distress.    Appearance: She is ill-appearing.  Cardiovascular:     Rate and Rhythm: Normal rate.  Pulmonary:     Effort: Pulmonary effort is normal. No respiratory distress.  Musculoskeletal:        General: No swelling.  Skin:    General: Skin is warm and dry.  Neurological:     Mental Status: She is alert.     Comments: Oriented to person and place only  Psychiatric:     Comments: Calm and cooperative, not fearful    Vital Signs: BP (!) 120/91 (BP  Location: Left Arm)    Pulse 93    Temp 97.7 F (36.5 C) (Oral)    Resp 16    Ht _0  (1.676 m)    Wt 57.6 kg    SpO2 100%    BMI 20.50 kg/m  Pain Scale: 0-10   Pain Score: 0-No pain   SpO2: SpO2: 100 % O2 Device:SpO2: 100 % O2 Flow Rate: .   IO: Intake/output summary:  Intake/Output Summary (Last 24 hours) at 08/17/2021 1335 Last data filed at 08/17/2021 0617 Gross per 24 hour  Intake 262.83 ml  Output 650 ml  Net -387.17 ml    LBM: Last BM Date: 08/16/21 Baseline Weight: Weight: 63.5 kg Most recent weight: Weight: 57.6 kg     Palliative Assessment/Data:   Flowsheet Rows    Flowsheet Row Most Recent Value  Intake Tab   Referral Department Hospitalist  Unit at Time of Referral Med/Surg Unit  Palliative Care Primary Diagnosis Neurology  Date Notified 08/14/21  Palliative Care Type New Palliative care  Reason for referral Clarify Goals of Care  Date of Admission 08/13/21  Date first seen by Palliative Care 08/17/21  #  of days Palliative referral response time 3 Day(s)  # of days IP prior to Palliative referral 1  Clinical Assessment   Palliative Performance Scale Score 30%  Pain Max last 24 hours Not able to report  Pain Min Last 24 hours Not able to report  Dyspnea Max Last 24 Hours Not able to report  Dyspnea Min Last 24 hours Not able to report  Psychosocial & Spiritual Assessment   Palliative Care Outcomes        Time In: 0915 Time Out: 1030 Time Total: 75 minutes  Greater than 50%  of this time was spent counseling and coordinating care related to the above assessment and plan.  Signed by: Drue Novel, NP   Please contact Palliative Medicine Team phone at [phone] for questions and concerns.  For individual provider: See Shea Evans

## 2021-08-17 NOTE — Progress Notes (Signed)
Patient sister called requesting patient to be Tele because of her HR. On-call notified. EKG ordered and shows ST. MD advice to continue monitoring without Tele. We continue to monitor.

## 2021-08-17 NOTE — Progress Notes (Signed)
RN called due to family member requesting for patient to be placed back on telemetry (this was discontinued in the morning during the day shift).  Chart was reviewed, HR was 104 bpm, EKG was done and it shows sinus tachycardia at a rate of 113 bpm with no prolonged QT interval.  Potassium which was low (3.0) in the morning lab was noted to have been replenished without any acute EKG changes and there was no indication to restart telemetry at this time. I was going to go talk to the family member to reassure her of why there is currently no indication for telemetry, however, RN states that she already went home. Patient was without any acute distress, we shall continue to monitor patient and manage accordingly. Consider reassuring patient's family regarding telemetry monitoring in the morning when she returns to bedside.
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
## 2021-08-17 NOTE — Progress Notes (Addendum)
[facility] PROGRESS NOTE    Progress Note  Colleen Lee  SHF:[id number] DOB: 10-Feb-1956 DOA: 08/13/2021 PCP: Sandi Mariscal, MD     Brief Narrative:   Colleen Lee is an 66 y.o. female past medical history of alcoholic cirrhosis, [name] asthma comes into the ED via EMS for acute metabolic encephalopathy seen recently by her neurologist who thought her weakness was secondary to chronic liver disease alcohol abuse failure to thrive and thiamine deficiency, CT of the head showed no acute intracranial abnormalities. In the setting of alcoholic cirrhosis, ammonia level was negative.  CT of the head showed advanced cerebral atrophy with chronic small vessel ischemic changes. She was started on IV fluids, folate we will give her high-dose IV thiamine.   Assessment/Plan:   Acute metabolic encephalopathy: Discontinue Megace Her GGT is significantly elevated, and her LFTs have stabilized which points to her ongoing chronic alcohol consumption. Her mentation is significantly improved. Consult palliative care. MRI of the brain is pending Patient cannot perform her ADLs and need assistance on daily regular basis. She is minimally ambulatory and requires frequent positioning.  Generalized  is pendingweakness: Secondary to above. Physical therapy evaluated the patient, she will need to go to short-term rehab. Family wants to take her home  Hyperkalemia: Resolved with IV fluid hydration. Was hypokalemic yesterday repleted now to this morning is 4.5.  Hypomagnesemia: Likely due to alcohol abuse. Repleted now resolved.  Hypophosphatemia: Phosphorus was low and was repleted is pending this morning.  Elevated troponins: No chest pain have basically remained flat likely demand ischemia.  Macrocytic anemia: Likely due to alcohol abuse folate level is low she is currently on oral repletion. B12 level is >1000  Essential hypertension: Cont metoprolol.    Leukocytosis: She  has remained afebrile, this morning she is more awake.  DVT prophylaxis: lovenox Family Communication:none Status is: Observation  The patient will require care spanning > 2 midnights and should be moved to inpatient because: Acute hepatic encephalopathy       Code Status:     Code Status Orders  (From admission, onward)           Start     Ordered   08/14/21 0223  Full code  Continuous        08/14/21 0222           Code Status History     Date Active Date Inactive Code Status Order ID Comments User Context   07/13/2021 2155 07/16/2021 2303 Full Code [id number]  Bethena Roys, MD Inpatient   05/18/2021 0004 05/22/2021 1927 Full Code [id number]  Unity, [place], DO ED   01/03/2021 1915 01/05/2021 2303 Full Code [id number]  Bethena Roys, MD Inpatient   10/17/2019 2204 10/20/2019 1951 Full Code [id number]  Etta Quill, DO ED   01/28/2018 2102 01/30/2018 1715 Full Code [id number]  Truett Mainland, DO ED   12/15/2017 1750 12/22/2017 2122 Full Code [id number]  Murlean Iba, MD Inpatient   06/10/2016 1305 06/13/2016 1404 Full Code [id number]  Velvet Bathe, MD Inpatient   06/08/2016 2304 06/09/2016 1816 Full Code [id number]  Vianne Bulls, MD Inpatient   02/05/2015 1219 02/08/2015 1631 Full Code [id number]  Carole Civil, MD Inpatient         IV Access:   Peripheral IV   Procedures and diagnostic studies:   DG CHEST PORT 1 VIEW  Result Date: 08/16/2021 CLINICAL DATA:  The reason for exam is stated as dyspnea. Hx of  asthma, HTN, and current smoker of 1 pack a day x 40 years. Negative covid-19 test x 3 days ago. EXAM: PORTABLE CHEST 1 VIEW COMPARISON:  Chest x-ray 07/09/2021 acidosis chest 09/27/2000 FINDINGS: Right PICC line with tip overlying the expected region of the superior cavoatrial junction. The heart and mediastinal contours are unchanged. No focal consolidation. No pulmonary edema. No pleural effusion. No pneumothorax. No acute osseous  abnormality. IMPRESSION: No active disease. Electronically Signed   By: Iven Finn M.D.   On: 08/16/2021 19:46     Medical Consultants:   None.   Subjective:    Colleen Lee feels good today no complaints.  Objective:    Vitals:   08/16/21 1901 08/16/21 2120 08/16/21 2300 08/17/21 0517  BP:  119/89 125/84 118/83  Pulse:  (!) 110 (!) 104 (!) 108  Resp:  18 19 18   Temp:  98.3 F (36.8 C) 98.4 F (36.9 C) 98.4 F (36.9 C)  TempSrc:  Oral Oral Oral  SpO2: 100% 100% 100% 100%  Weight:      Height:       SpO2: 100 %   Intake/Output Summary (Last 24 hours) at 08/17/2021 0727 Last data filed at 08/17/2021 0617 Gross per 24 hour  Intake 402.83 ml  Output 800 ml  Net -397.17 ml    Filed Weights   08/14/21 0352 08/14/21 0510 08/15/21 0555  Weight: 63.5 kg 55.5 kg 57.6 kg    Exam: General exam: In no acute distress. Respiratory system: Good air movement and clear to auscultation. Cardiovascular system: S1 & S2 heard, RRR. No JVD. Gastrointestinal system: Abdomen is nondistended, soft and nontender.  Skin: No rashes, lesions or ulcers Psychiatry: Judgement and insight appear normal. Mood & affect appropriate.   Data Reviewed:    Labs: Basic Metabolic Panel: Recent Labs  Lab 08/13/21 2250 08/13/21 2256 08/14/21 0342 08/14/21 1209 08/15/21 0447 08/16/21 0500 08/17/21 0457  NA 135 138 135  --  136 135 135  K 5.2* 5.1 4.3  --  4.2 3.0* 4.5  CL 111 111 113*  --  110 110 112*  CO2 19*  --  18*  --  18* 20* 18*  GLUCOSE 89 82 92  --  94 106* 95  BUN 16 15 14   --  10 5* <5*  CREATININE 0.96 0.90 0.72  --  0.67 0.55 0.59  CALCIUM 10.2  --  9.4  --  9.7 9.1 9.2  MG  --   --  1.6* 2.9* 2.4  --   --   PHOS  --   --  1.7* 3.4 1.8* 1.8*  --     GFR Estimated Creatinine Clearance: 62.9 mL/min (by C-G formula based on SCr of 0.59 mg/dL). Liver Function Tests: Recent Labs  Lab 08/13/21 2250 08/14/21 0342  AST 42* 34  ALT 18 16  ALKPHOS 256* 223*   BILITOT 1.1 1.0  PROT 6.4* 5.7*  ALBUMIN 2.0* 1.8*    No results for input(s): LIPASE, AMYLASE in the last 168 hours. Recent Labs  Lab 08/13/21 2250  AMMONIA 19    Coagulation profile Recent Labs  Lab 08/13/21 2250  INR 1.2    COVID-19 Labs  No results for input(s): DDIMER, FERRITIN, LDH, CRP in the last 72 hours.  Lab Results  Component Value Date   SARSCOV2NAA NEGATIVE 08/13/2021   [facility] NEGATIVE 08/04/2021   [place] NEGATIVE 07/13/2021   [place] NEGATIVE 07/09/2021    CBC: Recent Labs  Lab 08/13/21 2250 08/13/21  2256 08/14/21 0342  WBC 11.5*  --  11.2*  NEUTROABS 9.4*  --   --   HGB 9.5* 10.9* 8.8*  HCT 28.6* 32.0* 26.2*  MCV 102.5*  --  103.6*  PLT 225  --  207    Cardiac Enzymes: No results for input(s): CKTOTAL, CKMB, CKMBINDEX, TROPONINI in the last 168 hours. BNP (last 3 results) No results for input(s): PROBNP in the last 8760 hours. CBG: No results for input(s): GLUCAP in the last 168 hours. D-Dimer: No results for input(s): DDIMER in the last 72 hours. Hgb A1c: No results for input(s): HGBA1C in the last 72 hours. Lipid Profile: No results for input(s): CHOL, HDL, LDLCALC, TRIG, CHOLHDL, LDLDIRECT in the last 72 hours. Thyroid function studies: No results for input(s): TSH, T4TOTAL, T3FREE, THYROIDAB in the last 72 hours.  Invalid input(s): FREET3 Anemia work up: No results for input(s): VITAMINB12, FOLATE, FERRITIN, TIBC, IRON, RETICCTPCT in the last 72 hours.  Sepsis Labs: Recent Labs  Lab 08/13/21 2250 08/14/21 0342  WBC 11.5* 11.2*    Microbiology Recent Results (from the past 240 hour(s))  Resp Panel by RT-PCR (Flu A&B, Covid) Nasopharyngeal Swab     Status: None   Collection Time: 08/13/21  9:54 PM   Specimen: Nasopharyngeal Swab; Nasopharyngeal(NP) swabs in vial transport medium  Result Value Ref Range Status   SARS Coronavirus 2 by RT PCR NEGATIVE NEGATIVE Final    Comment: (NOTE) SARS-CoV-2 target  nucleic acids are NOT DETECTED.  The SARS-CoV-2 RNA is generally detectable in upper respiratory specimens during the acute phase of infection. The lowest concentration of SARS-CoV-2 viral copies this assay can detect is 138 copies/mL. A negative result does not preclude SARS-Cov-2 infection and should not be used as the sole basis for treatment or other patient management decisions. A negative result may occur with  improper specimen collection/handling, submission of specimen other than nasopharyngeal swab, presence of viral mutation(s) within the areas targeted by this assay, and inadequate number of viral copies(<138 copies/mL). A negative result must be combined with clinical observations, patient history, and epidemiological information. The expected result is Negative.  Fact Sheet for Patients:  EntrepreneurPulse.com.au  Fact Sheet for Healthcare Providers:  IncredibleEmployment.be  This test is no t yet approved or cleared by the [place] FDA and  has been authorized for detection and/or diagnosis of SARS-CoV-2 by FDA under an Emergency Use Authorization (EUA). This EUA will remain  in effect (meaning this test can be used) for the duration of the COVID-19 declaration under Section 564(b)(1) of the Act, 21 U.S.C.section 360bbb-3(b)(1), unless the authorization is terminated  or revoked sooner.       Influenza A by PCR NEGATIVE NEGATIVE Final   Influenza B by PCR NEGATIVE NEGATIVE Final    Comment: (NOTE) The Xpert Xpress SARS-CoV-2/FLU/RSV plus assay is intended as an aid in the diagnosis of influenza from Nasopharyngeal swab specimens and should not be used as a sole basis for treatment. Nasal washings and aspirates are unacceptable for Xpert Xpress SARS-CoV-2/FLU/RSV testing.  Fact Sheet for Patients: EntrepreneurPulse.com.au  Fact Sheet for Healthcare  Providers: IncredibleEmployment.be  This test is not yet approved or cleared by the [place] FDA and has been authorized for detection and/or diagnosis of SARS-CoV-2 by FDA under an Emergency Use Authorization (EUA). This EUA will remain in effect (meaning this test can be used) for the duration of the COVID-19 declaration under Section 564(b)(1) of the Act, 21 U.S.C. section 360bbb-3(b)(1), unless the authorization is terminated  or revoked.  Performed at [facility], [address] [place], [place] [address]      Medications:    amLODipine  5 mg Oral Daily   Chlorhexidine Gluconate Cloth  6 each Topical Daily   lactulose  10 g Oral BID   metoprolol tartrate  25 mg Oral BID   pantoprazole  40 mg Oral BID   sodium chloride flush  10-40 mL Intracatheter Q12H   thiamine  100 mg Oral Daily   Continuous Infusions:  dextrose 5 % and 0.45% NaCl 10 mL/hr at 08/17/21 0617      LOS: 3 days   Charlynne Cousins  [facility]  08/17/2021, 7:27 AM

## 2021-08-17 NOTE — Procedures (Signed)
[facility] Tele[facility]urology Consult Services  Routine EEG Report  Patient Name:   Colleen Lee, Colleen Lee Date of Birth:   12-25-1955 Identification Number:   MRN - [id number]  Date of Study:   08/17/2021 17:49:17  Duration: 23 min  Indication: Encephalopathy,  Technical Summary: A routine 20 channel electroencephalogram using the international 10-20 system of electrode placement was performed.  Background: 5-6 Hz, Poorly formed  States       Drowsy: were seen during drowsiness  Abnormalities  Generalized Slowing: Diffuse generalized slowing   Activation Procedures  Hyperventilation: Not performed  Photic Stimulation: Not performed  Classification: Abnormal :  Diagnosis: This is abnormal EEG, the Presence of Generalized slowing is consistent with Encephalopathy      Dr Tsosie Billing   [facility] [phone]  Case [id number]

## 2021-08-17 NOTE — Progress Notes (Incomplete)
*  PRELIMINARY RESULTS* Echocardiogram 2D Echocardiogram has been performed.  Colleen Lee 08/17/2021, 3:09 PM

## 2021-08-18 ENCOUNTER — Encounter: Payer: Self-pay | Admitting: Internal Medicine

## 2021-08-18 ENCOUNTER — Ambulatory Visit: Payer: Self-pay | Admitting: *Deleted

## 2021-08-18 DIAGNOSIS — G9340 Encephalopathy, unspecified: Secondary | ICD-10-CM

## 2021-08-18 DIAGNOSIS — I6381 Other cerebral infarction due to occlusion or stenosis of small artery: Secondary | ICD-10-CM

## 2021-08-18 DIAGNOSIS — D539 Nutritional anemia, unspecified: Secondary | ICD-10-CM

## 2021-08-18 DIAGNOSIS — I63211 Cerebral infarction due to unspecified occlusion or stenosis of right vertebral arteries: Secondary | ICD-10-CM

## 2021-08-18 LAB — CBC
HCT: 26.5 % — ABNORMAL LOW (ref 36.0–46.0)
Hemoglobin: 8.2 g/dL — ABNORMAL LOW (ref 12.0–15.0)
MCH: 33.7 pg (ref 26.0–34.0)
MCHC: 30.9 g/dL (ref 30.0–36.0)
MCV: 109.1 fL — ABNORMAL HIGH (ref 80.0–100.0)
Platelets: 208 10*3/uL (ref 150–400)
RBC: 2.43 MIL/uL — ABNORMAL LOW (ref 3.87–5.11)
RDW: 17.7 % — ABNORMAL HIGH (ref 11.5–15.5)
WBC: 10.7 10*3/uL — ABNORMAL HIGH (ref 4.0–10.5)
nRBC: 0 % (ref 0.0–0.2)

## 2021-08-18 LAB — LIPID PANEL
Cholesterol: 79 mg/dL (ref 0–200)
HDL: 14 mg/dL — ABNORMAL LOW (ref >40)
LDL Cholesterol: 49 mg/dL (ref 0–99)
Total CHOL/HDL Ratio: 5.6 RATIO
Triglycerides: 81 mg/dL (ref <150)
VLDL: 16 mg/dL (ref 0–40)

## 2021-08-18 LAB — HEMOGLOBIN A1C
Hgb A1c MFr Bld: 4.1 % — ABNORMAL LOW (ref 4.8–5.6)
Mean Plasma Glucose: 70.97 mg/dL

## 2021-08-18 LAB — RPR: RPR Ser Ql: NONREACTIVE

## 2021-08-18 MED ORDER — ENSURE ENLIVE PO LIQD
237.0000 mL | Freq: Three times a day (TID) | ORAL | Status: DC
  Administered 2021-08-18 – 2021-08-23 (×14): 237 mL via ORAL

## 2021-08-18 MED ORDER — ASPIRIN EC 81 MG PO TBEC
81.0000 mg | DELAYED_RELEASE_TABLET | Freq: Every day | ORAL | Status: DC
  Administered 2021-08-18 – 2021-08-23 (×6): 81 mg via ORAL
  Filled 2021-08-18 (×6): qty 1

## 2021-08-18 MED ORDER — HEPARIN SODIUM (PORCINE) 5000 UNIT/ML IJ SOLN
5000.0000 [IU] | Freq: Three times a day (TID) | INTRAMUSCULAR | Status: DC
  Administered 2021-08-18 – 2021-08-23 (×15): 5000 [IU] via SUBCUTANEOUS
  Filled 2021-08-18 (×14): qty 1

## 2021-08-18 NOTE — Progress Notes (Signed)
[facility] PROGRESS NOTE    Progress Note  Colleen Lee  YKD:[id number] DOB: 02/12/1956 DOA: 08/13/2021 PCP: Sandi Mariscal, MD     Brief Narrative:   Colleen SHEPARDSON is an 66 y.o. female past medical history of alcoholic cirrhosis, [name] asthma comes into the ED via EMS for acute metabolic encephalopathy seen recently by her neurologist who thought her weakness was secondary to chronic liver disease alcohol abuse failure to thrive and thiamine deficiency, CT of the head showed no acute intracranial abnormalities. In the setting of alcoholic cirrhosis, ammonia level was negative.  CT of the head showed advanced cerebral atrophy with chronic small vessel ischemic changes. She was started on IV fluids, folate we will give her high-dose IV thiamine.   Assessment/Plan:   Acute metabolic encephalopathy, possibly due to new CVA: MRI showed possible stroke. 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 Hemoglobin A1c pending B1 pending, continue oral supplementation Her mentation is significantly improved. Further management per neurology. Patient requires frequent re-positioning of the body in ways that cannot be achieved with an ordinary bed or wedge pillow, to eliminate pain, reduce pressure, and the head of the bed to be elevated more than 30 degrees most of the time due to generalized weakness in the setting of a new stroke Family met with palliative care.  Generalized  weakness: Secondary to above. Physical therapy evaluated the patient, she will need to go to short-term rehab. Family has agreed to send the patient to short-term rehab.  Hyperkalemia: Resolved with IV fluid hydration. Resolved  Hypomagnesemia: Likely due to alcohol abuse. Repleted now  resolved.  Hypophosphatemia: Improving but still low replete orally recheck in the morning.  Elevated troponins: No chest pain have basically remained flat likely demand ischemia.  Macrocytic anemia: Likely due to alcohol abuse folate level is low she is currently on oral repletion. B12 level is >1000  Essential hypertension: Cont metoprolol.    Leukocytosis: She has remained afebrile, this morning she is more awake.  DVT prophylaxis: lovenox Family Communication:none Status is: Observation  The patient will require care spanning > 2 midnights and should be moved to inpatient because: Acute hepatic encephalopathy    Code Status:     Code Status Orders  (From admission, onward)           Start     Ordered   08/14/21 0223  Full code  Continuous        08/14/21 0222           Code Status History     Date Active Date Inactive Code Status Order ID Comments User Context   07/13/2021 2155 07/16/2021 2303 Full Code [id number]  Bethena Roys, MD Inpatient   05/18/2021 0004 05/22/2021 1927 Full Code [id number]  Rolla Plate, DO ED   01/03/2021 1915 01/05/2021 2303 Full Code [id number]  Bethena Roys, MD Inpatient   10/17/2019 2204 10/20/2019 1951 Full Code [id number]  Etta Quill, DO ED   01/28/2018 2102 01/30/2018 1715 Full Code [id number]  Truett Mainland, DO ED   12/15/2017 1750 12/22/2017 2122 Full Code [id number]  Murlean Iba, MD Inpatient   06/10/2016 1305 06/13/2016 1404 Full Code [id number]  Velvet Bathe, MD Inpatient   06/08/2016 2304 06/09/2016 1816 Full Code [id number]  Vianne Bulls, MD Inpatient  02/05/2015 1219 02/08/2015 1631 Full Code [id number]  Carole Civil, MD Inpatient         IV Access:   Peripheral IV   Procedures and diagnostic studies:   CT ANGIO HEAD NECK W WO CM  Result Date: 08/17/2021 CLINICAL DATA:  Stroke/TIA, determine embolic source. Right frontoparietal and cerebellar infarcts as well as abnormal  appearance of the right vertebral artery on MRI. EXAM: CT ANGIOGRAPHY HEAD AND NECK TECHNIQUE: Multidetector CT imaging of the head and neck was performed using the standard protocol during bolus administration of intravenous contrast. Multiplanar CT image reconstructions and MIPs were obtained to evaluate the vascular anatomy. Carotid stenosis measurements (when applicable) are obtained utilizing NASCET criteria, using the distal internal carotid diameter as the denominator. RADIATION DOSE REDUCTION: This exam was performed according to the departmental dose-optimization program which includes automated exposure control, adjustment of the mA and/or kV according to patient size and/or use of iterative reconstruction technique. CONTRAST:  175m OMNIPAQUE IOHEXOL 350 MG/ML SOLN COMPARISON:  Head MRI 08/17/2021 FINDINGS: CT HEAD FINDINGS Brain: There is a small acute infarct superiorly in the right cerebellar hemisphere as shown on MRI. The subcentimeter right frontoparietal subcortical infarct on MRI is not well seen by CT. No intracranial hemorrhage, mass, midline shift, or extra-axial fluid collection is identified. There is mild cerebral atrophy. Periventricular white matter hypodensities are nonspecific but compatible with mild chronic small vessel ischemic disease. Vascular: Calcified atherosclerosis at the skull base. Skull: No fracture or suspicious osseous lesion. Sinuses: Unchanged fluid in the right sphenoid sinus. Trace right mastoid fluid. Orbits: Bilateral cataract extraction. Review of the MIP images confirms the above findings CTA NECK FINDINGS Aortic arch: Normal variant aortic arch branching pattern with common origin of the brachiocephalic and left common carotid arteries. Widely patent arch vessel origins. Right carotid system: Patent with a small amount calcified plaque at the carotid bifurcation. No evidence of a significant stenosis or dissection. Left carotid system: Patent with minimal  atherosclerotic plaque at the carotid bifurcation and possible tiny web laterally in the proximal aspect of the carotid bulb. No evidence of a significant stenosis or dissection. Vertebral arteries: The vertebral arteries are patent without evidence of a significant stenosis or dissection of the left vertebral artery which is strongly dominant. The right vertebral artery is diffusely small in caliber with a superimposed severe stenosis at its origin. Skeleton: Mild disc and moderate facet degeneration in the cervical spine. Other neck: No evidence of cervical lymphadenopathy or mass. Upper chest: Centrilobular emphysema. Partially visualized right PICC. Review of the MIP images confirms the above findings CTA HEAD FINDINGS Anterior circulation: Internal carotid arteries are patent from skull base to carotid termini. The cavernous carotid segments are tortuous with atherosclerotic calcification greater on the left not resulting in significant stenosis. ACAs and MCAs are patent without evidence of a proximal branch occlusion or significant proximal stenosis. No aneurysm is identified. Posterior circulation: The intracranial vertebral arteries are patent to the basilar with mild nonstenotic calcified plaque on the left. There is moderate to severe stenosis of the right vertebral artery proximal to the PICA origin. Patent PICA and SCA origins are seen bilaterally. The basilar artery is widely patent and tortuous. Posterior communicating arteries are diminutive or absent. Both PCAs are patent without evidence of a significant proximal stenosis. No aneurysm is identified. Venous sinuses: As permitted by contrast timing, patent. Anatomic variants: None. Review of the MIP images confirms the above findings IMPRESSION: 1. No large vessel occlusion. 2. 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 3. No significant stenosis of the strongly dominant left vertebral artery. 4.  Minimal cervical carotid artery atherosclerosis without significant stenosis. 5. Emphysema (ICD10-J43.9). Electronically Signed   By: Logan Bores M.D.   On: 08/17/2021 14:53   MR BRAIN WO CONTRAST  Result Date: 08/17/2021 CLINICAL DATA:  Provided history: Delirium. EXAM: MRI HEAD WITHOUT CONTRAST TECHNIQUE: Multiplanar, multiecho pulse sequences of the brain and surrounding structures were obtained without intravenous contrast. COMPARISON:  Prior head CT examinations 08/13/2021 and earlier. Brain MRI 07/13/2021. FINDINGS: Brain: Intermittently motion degraded examination, limiting evaluation. Most notably, there is severe motion degradation of the sagittal T1 weighted sequence, severe motion degradation of the axial T1 weighted sequence and severe motion degradation of the coronal T2 TSE sequence. Mild to moderate generalized cerebral atrophy. Comparatively mild cerebellar atrophy. 5 mm acute/early subacute infarct within the left frontoparietal subcortical white matter (series 5, image 25) (series 7, image 12). 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 Chronic small vessel ischemic changes which are mild in cerebral white matter, and mild to moderate in the pons. No evidence of an intracranial mass. No chronic intracranial blood products. No extra-axial fluid collection. No midline shift. Vascular: Similar to the prior brain MRI of 07/13/2021, there is signal abnormality within the intracranial right vertebral artery suggesting high-grade stenosis or vessel occlusion. Skull and upper cervical spine: Within described limitations, no focal suspicious marrow lesion is identified. Sinuses/Orbits: Visualized orbits show no acute finding. Large fluid level, and background mild mucosal thickening, within the right sphenoid sinus. Other: Trace fluid within the bilateral mastoid air cells. IMPRESSION: Intermittently motion  degraded examination, as described and limiting evaluation. 5 mm acute/early subacute infarct within the right frontoparietal subcortical white matter. 1.8 x 0.7 cm acute infarct within the superior right cerebellar hemisphere. Background chronic small vessel ischemic changes which are mild in the cerebral white matter, and mild-to-moderate in the pons, stable from the brain MRI of 07/13/2021. Similar to the prior MRI, there is signal abnormality within the intracranial right vertebral artery suggesting high-grade stenosis or vessel occlusion. MR or CT angiography may be obtained for further evaluation, as clinically warranted. Mild-to-moderate generalized cerebral atrophy. Comparatively mild cerebellar atrophy. Right sphenoid sinusitis. Electronically Signed   By: Kellie Simmering D.O.   On: 08/17/2021 09:35   DG CHEST PORT 1 VIEW  Result Date: 08/16/2021 CLINICAL DATA:  The reason for exam is stated as dyspnea. Hx of asthma, HTN, and current smoker of 1 pack a day x 40 years. Negative covid-19 test x 3 days ago. EXAM: PORTABLE CHEST 1 VIEW COMPARISON:  Chest x-ray 07/09/2021 acidosis chest 09/27/2000 FINDINGS: Right PICC line with tip overlying the expected region of the superior cavoatrial junction. The heart and mediastinal contours are unchanged. No focal consolidation. No pulmonary edema. No pleural effusion. No pneumothorax. No acute osseous abnormality. IMPRESSION: No active disease. Electronically Signed   By: Iven Finn M.D.   On: 08/16/2021 19:46   EEG adult  Result Date: 08/17/2021 Tsosie Billing, MD     08/17/2021 10:12 PM [facility] Routine EEG Report Patient Name:   Reinaldo Berber Date of Birth:   03-28-1956 Identification Number:   MRN - [id number] Date of Study:   08/17/2021 17:49:17 Duration: 23 min Indication: Encephalopathy, Technical Summary: A routine 20 channel electroencephalogram using the international 10-20 system of electrode  placement  was performed. Background: 5-6 Hz, Poorly formed States      Drowsy: were seen during drowsiness Abnormalities Generalized Slowing: Diffuse generalized slowing Activation Procedures Hyperventilation: Not performed Photic Stimulation: Not performed Classification: Abnormal : Diagnosis: This is abnormal EEG, the Presence of Generalized slowing is consistent with Encephalopathy Dr Tsosie Billing [facility] [phone] Case [id number]  ECHOCARDIOGRAM COMPLETE  Result Date: 08/17/2021    ECHOCARDIOGRAM REPORT   Patient Name:   AMESHA BAILEY [facility] Date of Exam: 08/17/2021 Medical Rec #:  [id number]      Height:       66.0 in Accession #:    [id number]     Weight:       127.0 lb Date of Birth:  25-Mar-1956     BSA:          1.649 m Patient Age:    70 years       BP:           122/94 mmHg Patient Gender: F              HR:           101 bpm. Exam Location:  [facility] Procedure: 2D Echo, Cardiac Doppler and Color Doppler Indications:    Stroke  History:        Patient has prior history of Echocardiogram examinations, most                 recent 07/06/2019. Risk Factors:Hypertension. ETOH Abuse.  Sonographer:    Wenda Low Referring Phys: [place]  1. Left ventricular ejection fraction, by estimation, is 65 to 70%. The left ventricle has normal function. The left ventricle has no regional wall motion abnormalities. There is mild left ventricular hypertrophy. Left ventricular diastolic parameters are consistent with Grade I diastolic dysfunction (impaired relaxation).  2. Right ventricular systolic function is normal. The right ventricular size is normal. There is mildly elevated pulmonary artery systolic pressure.  3. The mitral valve is normal in structure. No evidence of mitral valve regurgitation. No evidence of mitral stenosis.  4. The aortic valve has an indeterminant number of cusps. Aortic valve regurgitation is not visualized. No aortic stenosis is present. FINDINGS   Left Ventricle: Left ventricular ejection fraction, by estimation, is 65 to 70%. The left ventricle has normal function. The left ventricle has no regional wall motion abnormalities. The left ventricular internal cavity size was normal in size. There is  mild left ventricular hypertrophy. Left ventricular diastolic parameters are consistent with Grade I diastolic dysfunction (impaired relaxation). Normal left ventricular filling pressure. Right Ventricle: The right ventricular size is normal. No increase in right ventricular wall thickness. Right ventricular systolic function is normal. There is mildly elevated pulmonary artery systolic pressure. The tricuspid regurgitant velocity is 2.82  m/s, and with an assumed right atrial pressure of 8 mmHg, the estimated right ventricular systolic pressure is 95.3 mmHg. Left Atrium: Left atrial size was normal in size. Right Atrium: Right atrial size was normal in size. Pericardium: There is no evidence of pericardial effusion. Mitral Valve: The mitral valve is normal in structure. No evidence of mitral valve regurgitation. No evidence of mitral valve stenosis. MV peak gradient, 8.0 mmHg. The mean mitral valve gradient is 3.0 mmHg. Tricuspid Valve: The tricuspid valve is not well visualized. Tricuspid valve regurgitation is mild . No evidence of tricuspid stenosis. Aortic Valve: The aortic valve has an indeterminant number of cusps. Aortic valve regurgitation is not visualized. No aortic stenosis is present.  Aortic valve mean gradient measures 3.0 mmHg. Aortic valve peak gradient measures 5.6 mmHg. Aortic valve area, by VTI measures 2.20 cm. Pulmonic Valve: The pulmonic valve was not well visualized. Pulmonic valve regurgitation is not visualized. No evidence of pulmonic stenosis. Aorta: The aortic root is normal in size and structure. IAS/Shunts: No atrial level shunt detected by color flow Doppler.  LEFT VENTRICLE PLAX 2D LVIDd:         3.10 cm     Diastology LVIDs:          2.00 cm     LV e' medial:    5.66 cm/s LV PW:         1.20 cm     LV E/e' medial:  11.8 LV IVS:        1.30 cm     LV e' lateral:   6.85 cm/s LVOT diam:     1.80 cm     LV E/e' lateral: 9.8 LV SV:         47 LV SV Index:   29 LVOT Area:     2.54 cm  LV Volumes (MOD) LV vol d, MOD A2C: 20.3 ml LV vol d, MOD A4C: 14.2 ml LV vol s, MOD A2C: 8.2 ml LV vol s, MOD A4C: 5.8 ml LV SV MOD A2C:     12.1 ml LV SV MOD A4C:     14.2 ml LV SV MOD BP:      10.1 ml RIGHT VENTRICLE RV Basal diam:  3.20 cm RV Mid diam:    2.80 cm RV S prime:     17.30 cm/s LEFT ATRIUM             Index        RIGHT ATRIUM           Index LA diam:        3.10 cm 1.88 cm/m   RA Area:     11.50 cm LA Vol (A2C):   30.9 ml 18.74 ml/m  RA Volume:   26.20 ml  15.89 ml/m LA Vol (A4C):   17.0 ml 10.31 ml/m LA Biplane Vol: 25.1 ml 15.22 ml/m  AORTIC VALVE                    PULMONIC VALVE AV Area (Vmax):    2.48 cm     PV Vmax:       0.75 m/s AV Area (Vmean):   2.42 cm     PV Peak grad:  2.3 mmHg AV Area (VTI):     2.20 cm AV Vmax:           118.00 cm/s AV Vmean:          79.000 cm/s AV VTI:            0.214 m AV Peak Grad:      5.6 mmHg AV Mean Grad:      3.0 mmHg LVOT Vmax:         115.00 cm/s LVOT Vmean:        75.100 cm/s LVOT VTI:          0.185 m LVOT/AV VTI ratio: 0.86  AORTA Ao Root diam: 3.10 cm Ao Asc diam:  3.20 cm MITRAL VALVE                TRICUSPID VALVE MV Area (PHT): 5.97 cm     TR Peak grad:   31.8 mmHg MV Area VTI:   2.54  cm     TR Vmax:        282.00 cm/s MV Peak grad:  8.0 mmHg MV Mean grad:  3.0 mmHg     SHUNTS MV Vmax:       1.41 m/s     Systemic VTI:  0.18 m MV Vmean:      75.5 cm/s    Systemic Diam: 1.80 cm MV Decel Time: 127 msec MV E velocity: 66.80 cm/s MV A velocity: 112.00 cm/s MV E/A ratio:  0.60 Carlyle Dolly MD Electronically signed by Carlyle Dolly MD Signature Date/Time: 08/17/2021/3:31:03 PM    Final      Medical Consultants:   None.   Subjective:    Caroline Sauger Boorman no complaints  Objective:     Vitals:   08/17/21 1600 08/17/21 1959 08/18/21 0010 08/18/21 0426  BP: (!) 120/93 111/89 108/84 99/82  Pulse: 72 78 99 98  Resp: _0 Temp: 98.2 F (36.8 C) 97.8 F (36.6 C) 98.2 F (36.8 C) 98.7 F (37.1 C)  TempSrc: Oral Oral Oral Oral  SpO2: 100% 98% 100% 100%  Weight:      Height:       SpO2: 100 %   Intake/Output Summary (Last 24 hours) at 08/18/2021 0800 Last data filed at 08/18/2021 0507 Gross per 24 hour  Intake --  Output 400 ml  Net -400 ml    Filed Weights   08/14/21 0352 08/14/21 0510 08/15/21 0555  Weight: 63.5 kg 55.5 kg 57.6 kg    Exam: General exam: In no acute distress. Respiratory system: Good air movement and clear to auscultation. Cardiovascular system: S1 & S2 heard, RRR. No JVD. Gastrointestinal system: Abdomen is nondistended, soft and nontender.  Extremities: No pedal edema. Skin: No rashes, lesions or ulcers Psychiatry: Judgement and insight appear normal. Mood & affect appropriate.   Data Reviewed:    Labs: Basic Metabolic Panel: Recent Labs  Lab 08/13/21 2250 08/13/21 2256 08/14/21 0342 08/14/21 1209 08/15/21 0447 08/16/21 0500 08/17/21 0457  NA 135 138 135  --  136 135 135  K 5.2* 5.1 4.3  --  4.2 3.0* 4.5  CL 111 111 113*  --  110 110 112*  CO2 19*  --  18*  --  18* 20* 18*  GLUCOSE 89 82 92  --  94 106* 95  BUN _1 --  10 5* <5*  CREATININE 0.96 0.90 0.72  --  0.67 0.55 0.59  CALCIUM 10.2  --  9.4  --  9.7 9.1 9.2  MG  --   --  1.6* 2.9* 2.4  --   --   PHOS  --   --  1.7* 3.4 1.8* 1.8* 2.2*    GFR Estimated Creatinine Clearance: 62.9 mL/min (by C-G formula based on SCr of 0.59 mg/dL). Liver Function Tests: Recent Labs  Lab 08/13/21 2250 08/14/21 0342  AST 42* 34  ALT 18 16  ALKPHOS 256* 223*  BILITOT 1.1 1.0  PROT 6.4* 5.7*  ALBUMIN 2.0* 1.8*    No results for input(s): LIPASE, AMYLASE in the last 168 hours. Recent Labs  Lab 08/13/21 2250  AMMONIA 19    Coagulation profile Recent  Labs  Lab 08/13/21 2250  INR 1.2    COVID-19 Labs  No results for input(s): DDIMER, FERRITIN, LDH, CRP in the last 72 hours.  Lab Results  Component Value Date   SARSCOV2NAA NEGATIVE 08/13/2021   SARSCOV2NAA NEGATIVE 08/04/2021   SARSCOV2NAA NEGATIVE  07/13/2021   [place] NEGATIVE 07/09/2021    CBC: Recent Labs  Lab 08/13/21 2250 08/13/21 2256 08/14/21 0342  WBC 11.5*  --  11.2*  NEUTROABS 9.4*  --   --   HGB 9.5* 10.9* 8.8*  HCT 28.6* 32.0* 26.2*  MCV 102.5*  --  103.6*  PLT 225  --  207    Cardiac Enzymes: No results for input(s): CKTOTAL, CKMB, CKMBINDEX, TROPONINI in the last 168 hours. BNP (last 3 results) No results for input(s): PROBNP in the last 8760 hours. CBG: No results for input(s): GLUCAP in the last 168 hours. D-Dimer: No results for input(s): DDIMER in the last 72 hours. Hgb A1c: No results for input(s): HGBA1C in the last 72 hours. Lipid Profile: Recent Labs    08/18/21 0518  CHOL 79  HDL 14*  LDLCALC 49  TRIG 81  CHOLHDL 5.6   Thyroid function studies: No results for input(s): TSH, T4TOTAL, T3FREE, THYROIDAB in the last 72 hours.  Invalid input(s): FREET3 Anemia work up: No results for input(s): VITAMINB12, FOLATE, FERRITIN, TIBC, IRON, RETICCTPCT in the last 72 hours.  Sepsis Labs: Recent Labs  Lab 08/13/21 2250 08/14/21 0342  WBC 11.5* 11.2*    Microbiology Recent Results (from the past 240 hour(s))  Resp Panel by RT-PCR (Flu A&B, Covid) Nasopharyngeal Swab     Status: None   Collection Time: 08/13/21  9:54 PM   Specimen: Nasopharyngeal Swab; Nasopharyngeal(NP) swabs in vial transport medium  Result Value Ref Range Status   SARS Coronavirus 2 by RT PCR NEGATIVE NEGATIVE Final    Comment: (NOTE) SARS-CoV-2 target nucleic acids are NOT DETECTED.  The SARS-CoV-2 RNA is generally detectable in upper respiratory specimens during the acute phase of infection. The lowest concentration of SARS-CoV-2 viral copies this assay  can detect is 138 copies/mL. A negative result does not preclude SARS-Cov-2 infection and should not be used as the sole basis for treatment or other patient management decisions. A negative result may occur with  improper specimen collection/handling, submission of specimen other than nasopharyngeal swab, presence of viral mutation(s) within the areas targeted by this assay, and inadequate number of viral copies(<138 copies/mL). A negative result must be combined with clinical observations, patient history, and epidemiological information. The expected result is Negative.  Fact Sheet for Patients:  EntrepreneurPulse.com.au  Fact Sheet for Healthcare Providers:  IncredibleEmployment.be  This test is no t yet approved or cleared by the [place] FDA and  has been authorized for detection and/or diagnosis of SARS-CoV-2 by FDA under an Emergency Use Authorization (EUA). This EUA will remain  in effect (meaning this test can be used) for the duration of the COVID-19 declaration under Section 564(b)(1) of the Act, 21 U.S.C.section 360bbb-3(b)(1), unless the authorization is terminated  or revoked sooner.       Influenza A by PCR NEGATIVE NEGATIVE Final   Influenza B by PCR NEGATIVE NEGATIVE Final    Comment: (NOTE) The Xpert Xpress SARS-CoV-2/FLU/RSV plus assay is intended as an aid in the diagnosis of influenza from Nasopharyngeal swab specimens and should not be used as a sole basis for treatment. Nasal washings and aspirates are unacceptable for Xpert Xpress SARS-CoV-2/FLU/RSV testing.  Fact Sheet for Patients: EntrepreneurPulse.com.au  Fact Sheet for Healthcare Providers: IncredibleEmployment.be  This test is not yet approved or cleared by the [place] FDA and has been authorized for detection and/or diagnosis of SARS-CoV-2 by FDA under an Emergency Use Authorization (EUA). This EUA will  remain in effect (meaning this  test can be used) for the duration of the COVID-19 declaration under Section 564(b)(1) of the Act, 21 U.S.C. section 360bbb-3(b)(1), unless the authorization is terminated or revoked.  Performed at [facility], [address], [place], [place] [address]      Medications:    amLODipine  5 mg Oral Daily   Chlorhexidine Gluconate Cloth  6 each Topical Daily   folic acid  1 mg Oral Daily   lactulose  10 g Oral BID   metoprolol tartrate  50 mg Oral BID   multivitamin with minerals  1 tablet Oral Daily   pantoprazole  40 mg Oral BID   phosphorus  500 mg Oral QID   sodium chloride flush  10-40 mL Intracatheter Q12H   thiamine  100 mg Oral Daily   Continuous Infusions:  dextrose 5 % and 0.45% NaCl 10 mL/hr at 08/17/21 0916      LOS: 4 days   Charlynne Cousins  [facility]  08/18/2021, 8:00 AM

## 2021-08-18 NOTE — Progress Notes (Signed)
Pt  worked with Physical Therapy today and was ambulated to the chair with max assistance. Pt sat in the chair for a few hours and tolerated well.

## 2021-08-18 NOTE — Plan of Care (Signed)
I had long discussion with sister [name] who is the POA over the phone, updated pt current condition, treatment plan and potential prognosis, and answered all the questions. She expressed understanding and appreciation.   Rosalin Hawking, MD PhD Stroke Neurology 08/18/2021 12:52 PM

## 2021-08-18 NOTE — Care Management Important Message (Signed)
Important Message  Patient Details  Name: Colleen Lee MRN: [id number] Date of Birth: 02/05/1956   Medicare Important Message Given:  Yes     Tommy Medal 08/18/2021, 12:29 PM

## 2021-08-18 NOTE — Discharge Instructions (Signed)
[facility] Stay Proper nutrition can help your body recover from illness and injury.   Foods and beverages high in protein, vitamins, and minerals help rebuild muscle loss, promote healing, & reduce fall risk.   In addition to eating healthy foods, a nutrition shake is an easy, delicious way to get the nutrition you need during and after your hospital stay  It is recommended that you continue to drink 3 bottles per day of: Ensure for at least 1 month (30 days) after your hospital stay   Tips for adding a nutrition shake into your routine: As allowed, drink one with vitamins or medications instead of water or juice Enjoy one as a tasty mid-morning or afternoon snack Drink cold or make a milkshake out of it Drink one instead of milk with cereal or snacks Use as a coffee creamer   Available at the following grocery stores and pharmacies:           * [place] [phone]            For COUPONS visit: www.ensure.com/join or http://dawson-may.com/   Suggested Substitutions Ensure Plus = Boost Plus = Carnation Breakfast Essentials = Boost Compact Ensure Active Clear = Boost Breeze Glucerna Shake = Boost Glucose Control = Carnation Breakfast Essentials SUGAR FREE

## 2021-08-18 NOTE — Progress Notes (Signed)
STROKE TEAM PROGRESS NOTE   SUBJECTIVE (INTERVAL HISTORY) No family is at the bedside.  Overall her condition is unchanged. Lying in bed, seems more awake alert and less lethargic than yesterday. Still not orientated to time but otherwise orientated. She complains of mild abdominal pain and asking for her pain pill.    OBJECTIVE Temp:  [97.7 F (36.5 C)-98.7 F (37.1 C)] 97.7 F (36.5 C) (01/17 0809) Pulse Rate:  [72-117] 83 (01/17 0809) Resp:  [16-19] 17 (01/17 0426) BP: (99-122)/(82-94) 111/85 (01/17 0809) SpO2:  [98 %-100 %] 98 % (01/17 0809)  No results for input(s): GLUCAP in the last 168 hours. Recent Labs  Lab 08/13/21 2250 08/13/21 2256 08/14/21 0342 08/14/21 1209 08/15/21 0447 08/16/21 0500 08/17/21 0457  NA 135 138 135  --  136 135 135  K 5.2* 5.1 4.3  --  4.2 3.0* 4.5  CL 111 111 113*  --  110 110 112*  CO2 19*  --  18*  --  18* 20* 18*  GLUCOSE 89 82 92  --  94 106* 95  BUN 16 15 14   --  10 5* <5*  CREATININE 0.96 0.90 0.72  --  0.67 0.55 0.59  CALCIUM 10.2  --  9.4  --  9.7 9.1 9.2  MG  --   --  1.6* 2.9* 2.4  --   --   PHOS  --   --  1.7* 3.4 1.8* 1.8* 2.2*   Recent Labs  Lab 08/13/21 2250 08/14/21 0342  AST 42* 34  ALT 18 16  ALKPHOS 256* 223*  BILITOT 1.1 1.0  PROT 6.4* 5.7*  ALBUMIN 2.0* 1.8*   Recent Labs  Lab 08/13/21 2250 08/13/21 2256 08/14/21 0342  WBC 11.5*  --  11.2*  NEUTROABS 9.4*  --   --   HGB 9.5* 10.9* 8.8*  HCT 28.6* 32.0* 26.2*  MCV 102.5*  --  103.6*  PLT 225  --  207   No results for input(s): CKTOTAL, CKMB, CKMBINDEX, TROPONINI in the last 168 hours. No results for input(s): LABPROT, INR in the last 72 hours. No results for input(s): COLORURINE, LABSPEC, [place], GLUCOSEU, HGBUR, BILIRUBINUR, KETONESUR, PROTEINUR, UROBILINOGEN, NITRITE, LEUKOCYTESUR in the last 72 hours.  Invalid input(s): APPERANCEUR     Component Value Date/Time   CHOL 79 08/18/2021 0518   TRIG 81 08/18/2021 0518   HDL 14 (L) 08/18/2021 0518    CHOLHDL 5.6 08/18/2021 0518   VLDL 16 08/18/2021 0518   LDLCALC 49 08/18/2021 0518   Lab Results  Component Value Date   HGBA1C 4.1 (L) 08/18/2021      Component Value Date/Time   LABOPIA POSITIVE (A) 08/14/2021 0922   COCAINSCRNUR NONE DETECTED 08/14/2021 0922   LABBENZ NONE DETECTED 08/14/2021 0922   AMPHETMU NONE DETECTED 08/14/2021 0922   THCU NONE DETECTED 08/14/2021 0922   LABBARB NONE DETECTED 08/14/2021 0922    Recent Labs  Lab 08/13/21 2250  ETH <10    I have personally reviewed the radiological images below and agree with the radiology interpretations.  CT ANGIO HEAD NECK W WO CM  Result Date: 08/17/2021 CLINICAL DATA:  Stroke/TIA, determine embolic source. Right frontoparietal and cerebellar infarcts as well as abnormal appearance of the right vertebral artery on MRI. EXAM: CT ANGIOGRAPHY HEAD AND NECK TECHNIQUE: Multidetector CT imaging of the head and neck was performed using the standard protocol during bolus administration of intravenous contrast. Multiplanar CT image reconstructions and MIPs were obtained to evaluate the vascular anatomy.  Carotid stenosis measurements (when applicable) are obtained utilizing NASCET criteria, using the distal internal carotid diameter as the denominator. RADIATION DOSE REDUCTION: This exam was performed according to the departmental dose-optimization program which includes automated exposure control, adjustment of the mA and/or kV according to patient size and/or use of iterative reconstruction technique. CONTRAST:  161mL OMNIPAQUE IOHEXOL 350 MG/ML SOLN COMPARISON:  Head MRI 08/17/2021 FINDINGS: CT HEAD FINDINGS Brain: There is a small acute infarct superiorly in the right cerebellar hemisphere as shown on MRI. The subcentimeter right frontoparietal subcortical infarct on MRI is not well seen by CT. No intracranial hemorrhage, mass, midline shift, or extra-axial fluid collection is identified. There is mild cerebral atrophy. Periventricular  white matter hypodensities are nonspecific but compatible with mild chronic small vessel ischemic disease. Vascular: Calcified atherosclerosis at the skull base. Skull: No fracture or suspicious osseous lesion. Sinuses: Unchanged fluid in the right sphenoid sinus. Trace right mastoid fluid. Orbits: Bilateral cataract extraction. Review of the MIP images confirms the above findings CTA NECK FINDINGS Aortic arch: Normal variant aortic arch branching pattern with common origin of the brachiocephalic and left common carotid arteries. Widely patent arch vessel origins. Right carotid system: Patent with a small amount calcified plaque at the carotid bifurcation. No evidence of a significant stenosis or dissection. Left carotid system: Patent with minimal atherosclerotic plaque at the carotid bifurcation and possible tiny web laterally in the proximal aspect of the carotid bulb. No evidence of a significant stenosis or dissection. Vertebral arteries: The vertebral arteries are patent without evidence of a significant stenosis or dissection of the left vertebral artery which is strongly dominant. The right vertebral artery is diffusely small in caliber with a superimposed severe stenosis at its origin. Skeleton: Mild disc and moderate facet degeneration in the cervical spine. Other neck: No evidence of cervical lymphadenopathy or mass. Upper chest: Centrilobular emphysema. Partially visualized right PICC. Review of the MIP images confirms the above findings CTA HEAD FINDINGS Anterior circulation: Internal carotid arteries are patent from skull base to carotid termini. The cavernous carotid segments are tortuous with atherosclerotic calcification greater on the left not resulting in significant stenosis. ACAs and MCAs are patent without evidence of a proximal branch occlusion or significant proximal stenosis. No aneurysm is identified. Posterior circulation: The intracranial vertebral arteries are patent to the basilar with  mild nonstenotic calcified plaque on the left. There is moderate to severe stenosis of the right vertebral artery proximal to the PICA origin. Patent PICA and SCA origins are seen bilaterally. The basilar artery is widely patent and tortuous. Posterior communicating arteries are diminutive or absent. Both PCAs are patent without evidence of a significant proximal stenosis. No aneurysm is identified. Venous sinuses: As permitted by contrast timing, patent. Anatomic variants: None. Review of the MIP images confirms the above findings IMPRESSION: 1. No large vessel occlusion. 2. 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 3. No significant stenosis of the strongly dominant left vertebral artery. 4. Minimal cervical carotid artery atherosclerosis without significant stenosis. 5. Emphysema (ICD10-J43.9). Electronically Signed   By: Logan Bores M.D.   On: 08/17/2021 14:53   CT Head Wo Contrast  Result Date: 08/04/2021 CLINICAL DATA:  Provided history: Neuro deficit, acute, stroke suspected. Mental status change, unknown cause. Slurred speech. EXAM: CT HEAD WITHOUT CONTRAST TECHNIQUE: Contiguous axial images were obtained from the base of the skull through the vertex without intravenous contrast. COMPARISON:  Brain MRI 07/13/2021.  Head CT 05/13/2014. FINDINGS: Brain:  Mild generalized cerebral atrophy. Mild patchy and ill-defined hypoattenuation within the cerebral white matter, nonspecific but compatible with chronic small vessel ischemic disease. There is no acute intracranial hemorrhage. No demarcated cortical infarct. No extra-axial fluid collection. No evidence of an intracranial mass. No midline shift. Vascular: No hyperdense vessel.  Atherosclerotic calcifications. Skull: Normal. Negative for fracture or focal lesion. Sinuses/Orbits: Visualized orbits show no acute finding. No significant paranasal sinus disease at the imaged levels. IMPRESSION: No evidence of  acute intracranial abnormality. Mild chronic small vessel ischemic changes within the cerebral white matter. Mild generalized cerebral atrophy. Electronically Signed   By: Kellie Simmering D.O.   On: 08/04/2021 14:14   MR BRAIN WO CONTRAST  Result Date: 08/17/2021 CLINICAL DATA:  Provided history: Delirium. EXAM: MRI HEAD WITHOUT CONTRAST TECHNIQUE: Multiplanar, multiecho pulse sequences of the brain and surrounding structures were obtained without intravenous contrast. COMPARISON:  Prior head CT examinations 08/13/2021 and earlier. Brain MRI 07/13/2021. FINDINGS: Brain: Intermittently motion degraded examination, limiting evaluation. Most notably, there is severe motion degradation of the sagittal T1 weighted sequence, severe motion degradation of the axial T1 weighted sequence and severe motion degradation of the coronal T2 TSE sequence. Mild to moderate generalized cerebral atrophy. Comparatively mild cerebellar atrophy. 5 mm acute/early subacute infarct within the left frontoparietal subcortical white matter (series 5, image 25) (series 7, image 12). 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 Chronic small vessel ischemic changes which are mild in cerebral white matter, and mild to moderate in the pons. No evidence of an intracranial mass. No chronic intracranial blood products. No extra-axial fluid collection. No midline shift. Vascular: Similar to the prior brain MRI of 07/13/2021, there is signal abnormality within the intracranial right vertebral artery suggesting high-grade stenosis or vessel occlusion. Skull and upper cervical spine: Within described limitations, no focal suspicious marrow lesion is identified. Sinuses/Orbits: Visualized orbits show no acute finding. Large fluid level, and background mild mucosal thickening, within the right sphenoid sinus. Other: Trace fluid within the bilateral mastoid air cells.  IMPRESSION: Intermittently motion degraded examination, as described and limiting evaluation. 5 mm acute/early subacute infarct within the right frontoparietal subcortical white matter. 1.8 x 0.7 cm acute infarct within the superior right cerebellar hemisphere. Background chronic small vessel ischemic changes which are mild in the cerebral white matter, and mild-to-moderate in the pons, stable from the brain MRI of 07/13/2021. Similar to the prior MRI, there is signal abnormality within the intracranial right vertebral artery suggesting high-grade stenosis or vessel occlusion. MR or CT angiography may be obtained for further evaluation, as clinically warranted. Mild-to-moderate generalized cerebral atrophy. Comparatively mild cerebellar atrophy. Right sphenoid sinusitis. Electronically Signed   By: Kellie Simmering D.O.   On: 08/17/2021 09:35   DG CHEST PORT 1 VIEW  Result Date: 08/16/2021 CLINICAL DATA:  The reason for exam is stated as dyspnea. Hx of asthma, HTN, and current smoker of 1 pack a day x 40 years. Negative covid-19 test x 3 days ago. EXAM: PORTABLE CHEST 1 VIEW COMPARISON:  Chest x-ray 07/09/2021 acidosis chest 09/27/2000 FINDINGS: Right PICC line with tip overlying the expected region of the superior cavoatrial junction. The heart and mediastinal contours are unchanged. No focal consolidation. No pulmonary edema. No pleural effusion. No pneumothorax. No acute osseous abnormality. IMPRESSION: No active disease. Electronically Signed   By: Iven Finn M.D.   On: 08/16/2021 19:46   EEG adult  Result Date: 08/17/2021 Vaj[place]y,  Julio Sicks, MD     08/17/2021 10:12 PM [facility] Routine EEG Report Patient Name:   Colleen Lee, Colleen Lee Date of Birth:   05-Sep-1955 Identification Number:   MRN - [id number] Date of Study:   08/17/2021 17:49:17 Duration: 23 min Indication: Encephalopathy, Technical Summary: A routine 20 channel electroencephalogram using the  international 10-20 system of electrode placement was performed. Background: 5-6 Hz, Poorly formed States      Drowsy: were seen during drowsiness Abnormalities Generalized Slowing: Diffuse generalized slowing Activation Procedures Hyperventilation: Not performed Photic Stimulation: Not performed Classification: Abnormal : Diagnosis: This is abnormal EEG, the Presence of Generalized slowing is consistent with Encephalopathy Dr Tsosie [facility] [phone] Case [id number]  ECHOCARDIOGRAM COMPLETE  Result Date: 08/17/2021    ECHOCARDIOGRAM REPORT   Patient Name:   RUFUS CYPERT [facility] Date of Exam: 08/17/2021 Medical Rec #:  [id number]      Height:       66.0 in Accession #:    [id number]     Weight:       127.0 lb Date of Birth:  August 29, 1955     BSA:          1.649 m Patient Age:    32 years       BP:           122/94 mmHg Patient Gender: F              HR:           101 bpm. Exam Location:  [facility] Procedure: 2D Echo, Cardiac Doppler and Color Doppler Indications:    Stroke  History:        Patient has prior history of Echocardiogram examinations, most                 recent 07/06/2019. Risk Factors:Hypertension. ETOH Abuse.  Sonographer:    Wenda Low Referring Phys: [place]  1. Left ventricular ejection fraction, by estimation, is 65 to 70%. The left ventricle has normal function. The left ventricle has no regional wall motion abnormalities. There is mild left ventricular hypertrophy. Left ventricular diastolic parameters are consistent with Grade I diastolic dysfunction (impaired relaxation).  2. Right ventricular systolic function is normal. The right ventricular size is normal. There is mildly elevated pulmonary artery systolic pressure.  3. The mitral valve is normal in structure. No evidence of mitral valve regurgitation. No evidence of mitral stenosis.  4. The aortic valve has an indeterminant number of cusps. Aortic valve regurgitation is not visualized.  No aortic stenosis is present. FINDINGS  Left Ventricle: Left ventricular ejection fraction, by estimation, is 65 to 70%. The left ventricle has normal function. The left ventricle has no regional wall motion abnormalities. The left ventricular internal cavity size was normal in size. There is  mild left ventricular hypertrophy. Left ventricular diastolic parameters are consistent with Grade I diastolic dysfunction (impaired relaxation). Normal left ventricular filling pressure. Right Ventricle: The right ventricular size is normal. No increase in right ventricular wall thickness. Right ventricular systolic function is normal. There is mildly elevated pulmonary artery systolic pressure. The tricuspid regurgitant velocity is 2.82  m/s, and with an assumed right atrial pressure of 8 mmHg, the estimated right ventricular systolic pressure is 71.0 mmHg. Left Atrium: Left atrial size was normal in size. Right Atrium: Right atrial size was normal in size. Pericardium: There is no evidence of pericardial effusion. Mitral Valve: The mitral valve is normal in structure. No evidence of  mitral valve regurgitation. No evidence of mitral valve stenosis. MV peak gradient, 8.0 mmHg. The mean mitral valve gradient is 3.0 mmHg. Tricuspid Valve: The tricuspid valve is not well visualized. Tricuspid valve regurgitation is mild . No evidence of tricuspid stenosis. Aortic Valve: The aortic valve has an indeterminant number of cusps. Aortic valve regurgitation is not visualized. No aortic stenosis is present. Aortic valve mean gradient measures 3.0 mmHg. Aortic valve peak gradient measures 5.6 mmHg. Aortic valve area, by VTI measures 2.20 cm. Pulmonic Valve: The pulmonic valve was not well visualized. Pulmonic valve regurgitation is not visualized. No evidence of pulmonic stenosis. Aorta: The aortic root is normal in size and structure. IAS/Shunts: No atrial level shunt detected by color flow Doppler.  LEFT VENTRICLE PLAX 2D LVIDd:          3.10 cm     Diastology LVIDs:         2.00 cm     LV e' medial:    5.66 cm/s LV PW:         1.20 cm     LV E/e' medial:  11.8 LV IVS:        1.30 cm     LV e' lateral:   6.85 cm/s LVOT diam:     1.80 cm     LV E/e' lateral: 9.8 LV SV:         47 LV SV Index:   29 LVOT Area:     2.54 cm  LV Volumes (MOD) LV vol d, MOD A2C: 20.3 ml LV vol d, MOD A4C: 14.2 ml LV vol s, MOD A2C: 8.2 ml LV vol s, MOD A4C: 5.8 ml LV SV MOD A2C:     12.1 ml LV SV MOD A4C:     14.2 ml LV SV MOD BP:      10.1 ml RIGHT VENTRICLE RV Basal diam:  3.20 cm RV Mid diam:    2.80 cm RV S prime:     17.30 cm/s LEFT ATRIUM             Index        RIGHT ATRIUM           Index LA diam:        3.10 cm 1.88 cm/m   RA Area:     11.50 cm LA Vol (A2C):   30.9 ml 18.74 ml/m  RA Volume:   26.20 ml  15.89 ml/m LA Vol (A4C):   17.0 ml 10.31 ml/m LA Biplane Vol: 25.1 ml 15.22 ml/m  AORTIC VALVE                    PULMONIC VALVE AV Area (Vmax):    2.48 cm     PV Vmax:       0.75 m/s AV Area (Vmean):   2.42 cm     PV Peak grad:  2.3 mmHg AV Area (VTI):     2.20 cm AV Vmax:           118.00 cm/s AV Vmean:          79.000 cm/s AV VTI:            0.214 m AV Peak Grad:      5.6 mmHg AV Mean Grad:      3.0 mmHg LVOT Vmax:         115.00 cm/s LVOT Vmean:        75.100 cm/s LVOT VTI:  0.185 m LVOT/AV VTI ratio: 0.86  AORTA Ao Root diam: 3.10 cm Ao Asc diam:  3.20 cm MITRAL VALVE                TRICUSPID VALVE MV Area (PHT): 5.97 cm     TR Peak grad:   31.8 mmHg MV Area VTI:   2.54 cm     TR Vmax:        282.00 cm/s MV Peak grad:  8.0 mmHg MV Mean grad:  3.0 mmHg     SHUNTS MV Vmax:       1.41 m/s     Systemic VTI:  0.18 m MV Vmean:      75.5 cm/s    Systemic Diam: 1.80 cm MV Decel Time: 127 msec MV E velocity: 66.80 cm/s MV A velocity: 112.00 cm/s MV E/A ratio:  0.60 Carlyle Dolly MD Electronically signed by Carlyle Dolly MD Signature Date/Time: 08/17/2021/3:31:03 PM    Final    CT HEAD CODE STROKE WO CONTRAST  Result Date:  08/13/2021 CLINICAL DATA:  Code stroke. Initial evaluation for acute stroke, left facial droop. EXAM: CT HEAD WITHOUT CONTRAST TECHNIQUE: Contiguous axial images were obtained from the base of the skull through the vertex without intravenous contrast. RADIATION DOSE REDUCTION: This exam was performed according to the departmental dose-optimization program which includes automated exposure control, adjustment of the mA and/or kV according to patient size and/or use of iterative reconstruction technique. COMPARISON:  Prior head CT from 08/04/2021. FINDINGS: Brain: Moderately advanced cerebral atrophy with chronic small vessel ischemic disease. No acute intracranial hemorrhage. No acute large vessel territory infarct. No mass lesion, midline shift or mass effect. No hydrocephalus or extra-axial fluid collection. Vascular: No hyperdense vessel. Calcified atherosclerosis present at the skull base. Skull: Scalp soft tissues and calvarium within normal limits. Sinuses/Orbits: Globes and orbital soft tissues demonstrate no acute finding. Visualized paranasal sinuses and mastoid air cells are clear. Other: None. ASPECTS [facility] Stroke Program Early CT Score) - Ganglionic level infarction (caudate, lentiform nuclei, internal capsule, insula, M1-M3 cortex): 7 - Supraganglionic infarction (M4-M6 cortex): 3 Total score (0-10 with 10 being normal): 10 IMPRESSION: 1. No acute intracranial infarct or other abnormality. 2. ASPECTS is 10. 3. Moderately advanced cerebral atrophy with chronic small vessel ischemic disease. Results were called by telephone at the time of interpretation on 08/13/2021 at 10:52 pm to provider [facility] , who verbally acknowledged these results. Electronically Signed   By: Jeannine Boga M.D.   On: 08/13/2021 22:52   [place] EKG SITE RITE  Result Date: 08/14/2021 If Site Rite image not attached, placement could not be confirmed due to current cardiac rhythm.    PHYSICAL EXAM  Temp:  [97.7 F  (36.5 C)-98.7 F (37.1 C)] 97.7 F (36.5 C) (01/17 0809) Pulse Rate:  [72-117] 83 (01/17 0809) Resp:  [16-19] 17 (01/17 0426) BP: (99-122)/(82-94) 111/85 (01/17 0809) SpO2:  [98 %-100 %] 98 % (01/17 0809)  General - Well nourished, well developed, in no apparent distress, less lethargic than yesterday.   Ophthalmologic - fundi not visualized due to noncooperation.   Cardiovascular - Regular rhythm and rate.   Neuro - awake, alert, lethargic, eyes open, orientated to age, place and people, but not to time, not able to tell me the name of hospital. No aphasia, bradyphonia, mild dysarthria with poor denture. Paucity of speech, following all simple commands. No gaze palsy, no nystagmus, tracking bilaterally, visual field full. No facial droop. Tongue midline. Bilateral UEs 4/5, no  drift. Per RN, left hand grip weaker than right. Bilaterally LEs proximal 0/5 but distal toe PF/DF about 4/5. Sensation symmetrical bilaterally, b/l FTN intact although slow, gait not tested.    ASSESSMENT/PLAN Colleen Lee is a 66 y.o. female with history of chronic alcohol abuse, cirrhosis, [name], pancreatitis, hypertension admitted on 1/13 for altered mental status, lethargy, generalized weakness for 1-2 weeks.   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 Too small to explain pt AMS. 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  Atherosclerosis at b/l siphon L>R. 2D Echo  EF 65-70% LDL 49 HgbA1c 4.1 Heparin subq for VTE prophylaxis No antithrombotic prior to admission, now on aspirin 81 mg daily given subacute stroke, anemia and chronic alcohol use with cirrhosis.  Patient counseled to be compliant with her antithrombotic medications Ongoing aggressive stroke risk factor management Therapy recommendations:  SNF Disposition:  pending  BP management Stable on the low end Encourage po intake Avoid low BP given right VA hypoplastic with stenosis Long term BP goal normotensive  Lipid management Home meds:  none  LDL 49, goal < 70 No statin needed given LDL at goal and chronic alcohol use with cirrhosis  Alcohol abuse Cessation education provided B1 30.5 and FA 5.8 low in 07/2021 B1 repeat pending, on supplement FA repeat this admission still low, 5.5, on supplement Continue multivitamine INR and platelet normal Ammonia level normal GGT and AKI elevated  Macrocytic anemia Hb 9.5->8.8->8.2 MCV elevated Likely nutritional with FA deficiency with FTT On FA supplement Encourage po intake  Close monitoring  Other Stroke Risk Factors Advanced age, > 32  Other Active Problems Leukocytosis, WBC 11.5->11.2->10.7 Tachycardia, resolved after increased metoprolol dose [facility]  Hospital day # 4  Neurology will sign off. Please call with questions. Pt will follow up with Dr. Leta Baptist at [facility] in about 4 weeks. Thanks for the consult.   Rosalin Hawking, MD PhD Stroke Neurology 08/18/2021 8:41 AM    To contact Stroke Continuity provider, please refer to [email or web address] After hours, contact General Neurology

## 2021-08-18 NOTE — Progress Notes (Signed)
Pts sister  [name] called and wanted to talk to palliative to change pt from DNR to Full code.  Sister is POA of pt , This nurse has asked for copy of POA paper work to place in chart.

## 2021-08-18 NOTE — TOC Progression Note (Signed)
Transition of Care [facility]) - Progression Note    Patient Details  Name: Colleen Lee MRN: [id number] Date of Birth: [birth year]  Transition of Care [facility]) CM/SW Contact  Shade Flood, LCSW Phone Number: 08/18/2021, 9:08 AM  Clinical Narrative:     TOC following. Spoke with pt's sister who states pt/family now agreeable to short term rehab for pt. Sister states that they will take pt home from rehab and she has already spoken with Caryl Pina from [place] who said that they can deliver the DME ordered yesterday once pt has a dc date from rehab.  Discussed CMS SNF provider options and will refer as requested. Insurance authorization will be started.   TOC will follow.  Expected Discharge Plan: [place] Barriers to Discharge: Continued Medical Work up  Expected Discharge Plan and Services Expected Discharge Plan: [place] In-house Referral: Clinical Social Work   Post Acute Care Choice: [place] Living arrangements for the past 2 months: Single Family Home                                       Social Determinants of Health (SDOH) Interventions    Readmission Risk Interventions Readmission Risk Prevention Plan 08/14/2021 07/16/2021 05/19/2021  Transportation Screening Complete Complete Complete  HRI or Home Care Consult - - Complete  Social Work Consult for [place] Planning/Counseling - - Complete  Palliative Care Screening - - Not Applicable  Medication Review Press photographer) Complete Complete Complete  HRI or Home Care Consult Complete Complete -  SW Recovery Care/Counseling Consult Complete Complete -  Palliative Care Screening - Not Applicable -  [place] Complete Complete -  Some recent data might be hidden

## 2021-08-18 NOTE — Progress Notes (Addendum)
Palliative: [name], Colleen Lee, is working with physical therapy.  Her brother Colleen Lee and Colleen Lee are present.  PT assists Colleen Lee to the Colleen Lee chair.  She appears acutely/chronically ill and quite frail.  She is oriented to person and place, but not time.  I believe that she can make her basic needs known.  We talked at length about her acute and chronic health concerns.  We talked about neurology consult and testing.  We talked about vitamin deficiencies.  I verified that Colleen Lee has been sober for 2 years, she did have a binge episode about 8 months ago but has been sober since.    We talked about short-term rehab.  We talked about ideas desire and ability to rehab.  I encouraged family to work closely with Education officer, museum onsite.  Colleen Lee asks about what is next after rehab.  I shared that Colleen Lee's recovery over the next few weeks will let them know what is next for her.  We talked about outpatient palliative services to continue goals of care discussions.  Provider choice offered.  They choose hospice of [facility].  We talked about CODE STATUS, the idea of "treat the treatable, but allowing natural death".  At this point, Colleen Lee shares that she would want a natural death.  During our conversation, Colleen Lee shares that Colleen Lee has always stated that she wanted resuscitation.  We talked about "attempted resuscitation".  I shared that things are looking different now.  Colleen Lee and Colleen Lee share that they can respect out his wish to "treat the treatable but allowing natural death".  Orders adjusted.  Conference with attending, bedside nursing staff, transition care team related to patient condition, needs, goals of care, disposition, outpatient palliative services, CODE STATUS changed.  Plan: At this point continue to treat the treatable but no CPR or intubation.  Agreeable to trial of short-term rehab.  Agreeable to outpatient palliative services with hospice of [facility].  52 minutes  Quinn Axe,  NP Palliative medicine team Team phone [phone] Greater than 50% of this time was spent counseling and coordinating care related to the above assessment and plan.  Addendum: Patient siblings now state that they will not abide her wish to be DNR.  CODE STATUS returned to full code.

## 2021-08-18 NOTE — NC FL2 (Signed)
[place] LEVEL OF CARE SCREENING TOOL     IDENTIFICATION  Patient Name: Colleen Lee Birthdate: Aug 05, 1955 Sex: female Admission Date (Current Location): 08/13/2021  [facility] and [place] Number:  [employer] and Address:  [place] [address], [place]      Provider Number: [id number]  Attending Physician Name and Address:  Charlynne Cousins, MD  Relative Name and Phone Number:       Current Level of Care: Hospital Recommended Level of Care: [place] Prior Approval Number:    Date Approved/Denied:   PASRR Number: [id number]  Discharge Plan: SNF    Current Diagnoses: Patient Active Problem List   Diagnosis Date Noted   Protein-calorie malnutrition, severe 08/15/2021   Pressure injury of skin 08/15/2021   Hyperkalemia 08/14/2021   Failure to thrive (child) 08/14/2021   Hypomagnesemia 08/14/2021   Hypophosphatemia 08/14/2021   Elevated troponin 08/14/2021   Failure to thrive in adult 08/14/2021   Encephalopathy    Portal hypertension ([place]) 07/15/2021   SIRS (systemic inflammatory response syndrome) ([place]) 07/14/2021   RUQ abdominal pain    Macrocytic anemia    Intractable vomiting 00/17/4944   Acute metabolic encephalopathy 96/75/9163   Weakness    Malaise    Pancreatitis, recurrent 05/17/2021   Dysuria 09/16/2020   Body mass index (BMI) 31.0-31.9, adult 01/07/2020   Osteoarthritis of spine with radiculopathy, 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   Acute pancreatitis 10/17/2019   ETOH abuse 10/17/2019   S/P right rotator cuff repair 01/30/19 02/06/2019   Nontraumatic complete tear of right rotator cuff    Arthritis of right acromioclavicular joint    S/P total knee replacement, 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/24/2018   Shoulder impingement, right 05/24/2018   Dehydration    Intractable cyclical vomiting 84/66/5993   Pyloric stenosis in adult    Thrombocytopenia ([place]) 12/20/2017   Leukocytosis     Malnutrition of moderate degree 12/17/2017   Acute renal failure (ARF) ([place]) 12/15/2017   Chronic abdominal pain 12/15/2017   Gastroenteritis 06/14/2016   Nausea with vomiting 06/10/2016   Uterine enlargement 06/09/2016   Intractable nausea and vomiting 06/08/2016   Acute infective gastroenteritis 06/08/2016   Diarrhea 06/08/2016   Essential hypertension 06/08/2016   GERD (gastroesophageal reflux disease) 06/08/2016   Abdominal pain    Endometrial polyp 04/15/2016   PMB (postmenopausal bleeding) 57/08/7791   Alcoholic cirrhosis of liver without ascites ([place])    Asthma 01/21/2016   Hepatic cirrhosis ([place]) 09/22/2015   Arthritis of knee, degenerative 90/30/0923   History of Helicobacter pylori infection 10/30/2014   Lumbago with sciatica 07/01/2014   Chronic [name] with cirrhosis ([place]) 04/11/2014   De Quervain's disease (radial styloid tenosynovitis) 12/25/2013   Anorexia 11/21/2012   FH: colon cancer 11/21/2012   Early satiety 10/25/2012   Bowel habit changes 10/25/2012   Abdominal pain, epigastric 10/25/2012   Abdominal bloating 10/25/2012   Constipation 10/25/2012   Abnormal weight loss 10/25/2012   Chronic viral [name] ([place]) 10/25/2012   Radicular pain of left lower extremity 09/28/2012   Back pain 09/28/2012   Sciatica 08/10/2011   S/P arthroscopy of left knee 08/10/2011   Tibial plateau fracture 08/10/2011   Pain in joint, lower leg 02/12/2011   Stiffness of joint, not elsewhere classified, lower leg 02/12/2011   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   Meniscus, medial, derangement 12/29/2010   CLOSED FRACTURE OF UPPER END OF TIBIA 08/12/2010    Orientation RESPIRATION BLADDER Height & Weight  Self, Situation, Place, Time  Normal Incontinent Weight: 126 lb 15.8 oz (57.6 kg) Height:  5\' 6"  (167.6 cm)  BEHAVIORAL SYMPTOMS/MOOD NEUROLOGICAL BOWEL NUTRITION STATUS      Incontinent Diet (see dc summary)  AMBULATORY STATUS COMMUNICATION OF NEEDS Skin    Extensive Assist Verbally PU Stage and Appropriate Care ([place], R Buttock, L Heel)   PU Stage 2 Dressing: Daily                   Personal Care Assistance Level of Assistance  Bathing, Feeding, Dressing Bathing Assistance: Maximum assistance Feeding assistance: Limited assistance Dressing Assistance: Maximum assistance     Functional Limitations Info  Sight, Hearing, Speech Sight Info: Adequate Hearing Info: Adequate Speech Info: Adequate    SPECIAL CARE FACTORS FREQUENCY  PT (By licensed PT), OT (By licensed OT)     PT Frequency: 5x week OT Frequency: 3x week            Contractures Contractures Info: Not present    Additional Factors Info  Code Status, Allergies Code Status Info: Full Allergies Info: Penicillins           Current Medications (08/18/2021):  This is the current hospital active medication list Current Facility-Administered Medications  Medication Dose Route Frequency Provider Last Rate Last Admin   albuterol (PROVENTIL) (2.5 MG/3ML) 0.083% nebulizer solution 2.5 mg  2.5 mg Nebulization Q4H PRN Charlynne Cousins, MD   2.5 mg at 08/16/21 1857   amLODipine (NORVASC) tablet 5 mg  5 mg Oral Daily Charlynne Cousins, MD   5 mg at 08/18/21 5784   aspirin EC tablet 81 mg  81 mg Oral Daily Rosalin Hawking, MD       Chlorhexidine Gluconate Cloth 2 % PADS 6 each  6 each Topical Daily Charlynne Cousins, MD   6 each at 08/18/21 0851   dextrose 5 %-0.45 % sodium chloride infusion   Intravenous Continuous Charlynne Cousins, MD 10 mL/hr at 08/17/21 0916 New Bag at 69/62/95 2841   folic acid (FOLVITE) tablet 1 mg  1 mg Oral Daily Rosalin Hawking, MD   1 mg at 08/18/21 0849   heparin injection 5,000 Units  5,000 Units Subcutaneous Lina Sar, MD       HYDROcodone-acetaminophen (NORCO/VICODIN) 5-325 MG per tablet 1-2 tablet  1-2 tablet Oral Q4H PRN Charlynne Cousins, MD   1 tablet at 08/18/21 0540   lactulose (CHRONULAC) 10 GM/15ML solution 10 g  10 g  Oral BID Charlynne Cousins, MD   10 g at 08/18/21 0851   metoprolol tartrate (LOPRESSOR) injection 5 mg  5 mg Intravenous Q6H PRN Charlynne Cousins, MD       metoprolol tartrate (LOPRESSOR) tablet 50 mg  50 mg Oral BID Charlynne Cousins, MD   50 mg at 08/18/21 0849   morphine 4 MG/ML injection 4 mg  4 mg Intravenous Q3H PRN Charlynne Cousins, MD   4 mg at 08/16/21 1245   multivitamin with minerals tablet 1 tablet  1 tablet Oral Daily Rosalin Hawking, MD   1 tablet at 08/17/21 1547   pantoprazole (PROTONIX) EC tablet 40 mg  40 mg Oral BID Adefeso, Oladapo, DO   40 mg at 08/18/21 0850   phosphorus (K PHOS NEUTRAL) tablet 500 mg  500 mg Oral QID Charlynne Cousins, MD   500 mg at 08/18/21 0850   sodium chloride flush (NS) 0.9 % injection 10-40 mL  10-40 mL Intracatheter Q12H Aileen Fass,  Tammi Klippel, MD   30 mL at 08/18/21 0858   sodium chloride flush (NS) 0.9 % injection 10-40 mL  10-40 mL Intracatheter PRN Charlynne Cousins, MD       thiamine tablet 100 mg  100 mg Oral Daily Adefeso, Oladapo, DO   100 mg at 08/18/21 7371     Discharge Medications: Please see discharge summary for a list of discharge medications.  Relevant Imaging Results:  Relevant Lab Results:   Additional Information SSN: [id number] [place]

## 2021-08-18 NOTE — Progress Notes (Signed)
Physical Therapy Treatment Patient Details Name: Colleen Lee MRN:  DOB: [birth year] Today's Date: 08/18/2021   History of Present Illness Colleen Lee is a 66 y.o. female with medical history significant for alcoholic liver cirrhosis, [name], asthma who presents to the emergency department via EMS due to change in mental status.  Patient was unable to provide history, history was obtained from ED physician and sister at bedside.  Per sister, patient has had 1 week of an increased and worsening weakness.  Patient was noted to be very weak today that she was not even communicative, she was noted just staring at the ceiling without responding to the sister, so EMS was activated.  Patient was noted with some waxing and waning of mental status en route to the ED.  Patient was evaluated by a neurologist (Dr. Leta Baptist) 2 days ago due to the weakness which was deemed to be likely due to patient's history of chronic liver disease, chronic alcohol abuse, failure to thrive and thiamine deficiency.  She was admitted on 12/12 and discharged on 12/15 2022 due to acute metabolic encephalopathy, intractable vomiting and SIRS    PT Comments    Patient alert and agreeable for therapy - family members present in room.  Patient demonstrates slow labored movement for sitting up at bedside with difficulty moving BLE due to weakness and stiffness, once seated required verbal/tactile cueing, demonstration to complete exercises and active assistance to complete seated marching in place.  Patient very stiff in knees, left worse than right, tends to slide feet forward during sit to stands, transfers, able to transfer to [facility] and chair with Max hand held assist due to poor carryover for using RW.  Patient tolerated sitting up in chair after therapy with family present - nurse aware.  Patient will benefit from continued skilled physical therapy in hospital and recommended venue below to increase strength, balance, endurance  for safe ADLs and gait.       Recommendations for follow up therapy are one component of a multi-disciplinary discharge planning process, led by the attending physician.  Recommendations may be updated based on patient status, additional functional criteria and insurance authorization.  Follow Up Recommendations  Skilled nursing-short term rehab (<3 hours/day)     Assistance Recommended at Discharge Intermittent Supervision/Assistance  Patient can return home with the following A lot of help with bathing/dressing/bathroom;A lot of help with walking and/or transfers;Help with stairs or ramp for entrance;Two people to help with walking and/or transfers   Equipment Recommendations  Rolling walker (2 wheels)    Recommendations for Other Services       Precautions / Restrictions Precautions Precautions: Fall Restrictions Weight Bearing Restrictions: No     Mobility  Bed Mobility Overal bed mobility: Needs Assistance Bed Mobility: Supine to Sit     Supine to sit: Mod assist, Max assist     General bed mobility comments: slow labored movement with difficulty moving BLE due to weakness    Transfers Overall transfer level: Needs assistance Equipment used: Rolling walker (2 wheels) Transfers: Sit to/from Stand, Bed to chair/wheelchair/BSC Sit to Stand: Max assist, Mod assist Stand pivot transfers: Max assist         General transfer comment: poor carryover for using RW due to frequent posterior leaning with BLE sliding forward    Ambulation/Gait Ambulation/Gait assistance: Max assist Gait Distance (Feet): 2 Feet Assistive device: 1 person hand held assist Gait Pattern/deviations: Decreased step length - right, Decreased step length - left, Decreased stance  time - left, Decreased stride length, Leaning posteriorly Gait velocity: slow     General Gait Details: limited to a couple of shuffling side steps with frequent sliding forward of feet especially LLE   Stairs              Wheelchair Mobility    Modified Rankin (Stroke Patients Only)       Balance Overall balance assessment: Needs assistance Sitting-balance support: Feet supported, No upper extremity supported Sitting balance-Leahy Scale: Fair Sitting balance - Comments: seated at EOB Postural control: Posterior lean Standing balance support: Reliant on assistive device for balance, During functional activity, Bilateral upper extremity supported Standing balance-Leahy Scale: Poor Standing balance comment: using RW and bilateral hand held assist                            Cognition Arousal/Alertness: Awake/alert Behavior During Therapy: WFL for tasks assessed/performed Overall Cognitive Status: Within Functional Limits for tasks assessed                                          Exercises General Exercises - Lower Extremity Long Arc Quad: Seated, AROM, Strengthening, Both, 10 reps Hip Flexion/Marching: Seated, AROM, Strengthening, AAROM, Both, 10 reps Toe Raises: Seated, AROM, Strengthening, Both, 10 reps Heel Raises: Seated, AROM, Strengthening, Both, 10 reps    General Comments        Pertinent Vitals/Pain Pain Assessment Pain Assessment: 0-10 Pain Score: 7  Pain Location: left flank Pain Descriptors / Indicators: Sore Pain Intervention(s): Limited activity within patient's tolerance, Monitored during session, Repositioned    Home Living                          Prior Function            PT Goals (current goals can now be found in the care plan section) Acute Rehab PT Goals Patient Stated Goal: return home PT Goal Formulation: With patient Time For Goal Achievement: 08/28/21 Potential to Achieve Goals: Good Progress towards PT goals: Progressing toward goals    Frequency    Min 3X/week      PT Plan Current plan remains appropriate    Co-evaluation PT/OT/SLP Co-Evaluation/Treatment: Yes            AM-PAC  PT "6 Clicks" Mobility   Outcome Measure  Help needed turning from your back to your side while in a flat bed without using bedrails?: A Lot Help needed moving from lying on your back to sitting on the side of a flat bed without using bedrails?: A Lot Help needed moving to and from a bed to a chair (including a wheelchair)?: A Lot Help needed standing up from a chair using your arms (e.g., wheelchair or bedside chair)?: A Lot Help needed to walk in hospital room?: Total Help needed climbing 3-5 steps with a railing? : Total 6 Click Score: 10    End of Session Equipment Utilized During Treatment: Gait belt Activity Tolerance: Patient tolerated treatment well;Patient limited by fatigue Patient left: in chair;with call bell/phone within reach Nurse Communication: Mobility status PT Visit Diagnosis: Unsteadiness on feet (R26.81);Other abnormalities of gait and mobility (R26.89);Muscle weakness (generalized) (M62.81)     Time: 9528-4132 PT Time Calculation (min) (ACUTE ONLY): 31 min  Charges:  $Therapeutic Exercise: 8-22 mins $Therapeutic Activity: 8-22  mins                     11:29 AM, 08/18/21 Lonell Grandchild, MPT Physical Therapist with [facility] [phone] office [phone] mobile phone

## 2021-08-18 NOTE — Progress Notes (Signed)
Nutrition Follow-up  DOCUMENTATION CODES:  Severe malnutrition in context of chronic illness  INTERVENTION:  Continue diet per SLP.  Add Ensure Plus High Protein po TID, each supplement provides 350 kcal and 20 grams of protein.   Add Magic cup TID with meals, each supplement provides 290 kcal and 9 grams of protein.  Continue MVI with minerals daily.  Encourage PO and supplement intake.  NUTRITION DIAGNOSIS:  Severe Malnutrition related to chronic illness (cirrhosis) as evidenced by percent weight loss, energy intake < or equal to 75% for > or equal to 1 month. - ongoing  GOAL:  Patient will meet greater than or equal to 90% of their needs. - progressing  MONITOR:  PO intake, Supplement acceptance, Labs, Weight trends, Skin, I & O's  REASON FOR ASSESSMENT:  Malnutrition Screening Tool    ASSESSMENT:  Patient is a 66 yo female with hx of  cirrhosis (ETOH), [name], asthma. She presents with altered mental status, acute hepatic encephalopathy. 1/12 - NPO 1/14 - Dysphagia 3/thins 1/15 - NPO 1/16 - Dysphagia 3/thins  Per Palliative Care note, "At this point continue to treat the treatable but no CPR or intubation. Agreeable to trial of short-term rehab. Agreeable to outpatient palliative services with hospice of [facility] with family while patient prepared to sit on the bedside commode.   Pt reports that pt is feeling better today and ate a good majority of her breakfast with minimal chewing concerns.  They requested additional supplements. RD to order Ensure TID, and family agrees.  Pt with limited meal documentation, but also poor intake. Four meals documented: three at 10% and one at 80%.  Medications: reviewed; folic acid, lactulose BID, MVI with minerals, Protonix BID, K Phos QID, thiamine, D5 in NaCl @ 10 ml/hr, Vicodin PO PRN (given twice today)  Labs: reviewed  Diet Order:   Diet Order             DIET DYS 3 Room service appropriate?  Yes; Fluid consistency: Thin  Diet effective now                  EDUCATION NEEDS:  Education needs have been addressed  Skin:  Skin Assessment: Skin Integrity Issues: Skin Integrity Issues:: DTI, Stage II DTI: L heel Stage II: Sacrum; R buttocks  Last BM:  08/17/21 - Type 7, small  Height:  Ht Readings from Last 1 Encounters:  08/14/21 5\' 6"  (1.676 m)   Weight:  Wt Readings from Last 1 Encounters:  08/15/21 57.6 kg   BMI:  Body mass index is 20.5 kg/m.  Estimated Nutritional Needs:  Kcal:  2050-2250 Protein:  85-100 grams Fluid:  >2 L  Derrel Nip, RD, LDN (she/her/hers) Clinical Inpatient Dietitian RD Pager/After-Hours/Weekend Pager # in [place]

## 2021-08-19 ENCOUNTER — Other Ambulatory Visit: Payer: Self-pay

## 2021-08-19 LAB — VITAMIN B1: Vitamin B1 (Thiamine): 183.3 nmol/L (ref 66.5–200.0)

## 2021-08-19 LAB — PHOSPHORUS: Phosphorus: 6.4 mg/dL — ABNORMAL HIGH (ref 2.5–4.6)

## 2021-08-19 NOTE — Progress Notes (Signed)
Palliative: Ms. Yeraldin, [place], is sitting up in the [place] chair in her room.  She appears acutely/chronically ill and very frail.  She continues to be disoriented at times.  Sister [name] and brother Quillian Quince are at bedside.  We talked about anticipated discharge to short-term rehab at [facility] tomorrow.  We also talked about CODE STATUS.  [name] shares that she spoke with their older brother who states that they "would not put a DNR on her".  I share with family the CODE STATUS was immediately changed yesterday at request.  Conference with attending, bedside nursing staff, transition of care team related to patient condition, needs, goals of care, disposition.  Plan: Full scope/full code.  Short-term rehab.  Outpatient palliative with hospice of [facility].  25 minutes Quinn Axe, NP Palliative medicine team Team phone [phone] Greater than 50% of this time was spent counseling and coordinating care related to the above assessment and plan.

## 2021-08-19 NOTE — Progress Notes (Signed)
Occupational Therapy Treatment Patient Details Name: Colleen Lee MRN: [id number] DOB: [birth year] Today's Date: 08/19/2021   History of present illness Colleen Lee is a 66 y.o. female with medical history significant for alcoholic liver cirrhosis, [name], asthma who presents to the emergency department via EMS due to change in mental status.  Patient was unable to provide history, history was obtained from ED physician and sister at bedside.  Per sister, patient has had 1 week of an increased and worsening weakness.  Patient was noted to be very weak today that she was not even communicative, she was noted just staring at the ceiling without responding to the sister, so EMS was activated.  Patient was noted with some waxing and waning of mental status en route to the ED.  Patient was evaluated by a neurologist (Dr. Marjory Lies) 2 days ago due to the weakness which was deemed to be likely due to patient's history of chronic liver disease, chronic alcohol abuse, failure to thrive and thiamine deficiency.  She was admitted on 12/12 and discharged on 12/15 2022 due to acute metabolic encephalopathy, intractable vomiting and SIRS   OT comments  Pt agreeable to OT treatment. Pt demonstrated improved bed mobility with Min A and extended time with HOB elevated. Pt continues to require significant assist for sit to stand and stand pivot transfer to chair. Pt continues to lean posteriorly when prompted to stand. Gait belt and this therapist anterior for support for transfer. Once in the chair pt completed UE strengthening with Min A needed to reach full range for shoulder flexion. Pt will benefit from continued OT in the hospital and recommended venue below to increase strength, balance, and endurance for safe ADL's.       Recommendations for follow up therapy are one component of a multi-disciplinary discharge planning process, led by the attending physician.  Recommendations may be updated based on  patient status, additional functional criteria and insurance authorization.    Follow Up Recommendations  Skilled nursing-short term rehab (<3 hours/day)    Assistance Recommended at Discharge Frequent or constant Supervision/Assistance  Patient can return home with the following  A lot of help with walking and/or transfers;A lot of help with bathing/dressing/bathroom;Assistance with cooking/housework;Direct supervision/assist for medications management;Direct supervision/assist for financial management;Assist for transportation;Help with stairs or ramp for entrance   Equipment Recommendations  None recommended by OT    Recommendations for Other Services      Precautions / Restrictions Precautions Precautions: Fall Restrictions Weight Bearing Restrictions: No       Mobility Bed Mobility Overal bed mobility: Needs Assistance Bed Mobility: Supine to Sit     Supine to sit: HOB elevated, Min assist     General bed mobility comments: labored movement using bed rail and single hand held assist    Transfers Overall transfer level: Needs assistance   Transfers: Sit to/from Stand, Bed to chair/wheelchair/BSC Sit to Stand: Max assist, Mod assist Stand pivot transfers: Max assist         General transfer comment: RW not attempted; pt demonstrating significant posterior lean when attempting to stand.     Balance Overall balance assessment: Needs assistance Sitting-balance support: Feet supported, No upper extremity supported Sitting balance-Leahy Scale: Good Sitting balance - Comments: seated at EOB Postural control: Posterior lean Standing balance support: During functional activity, Bilateral upper extremity supported (Reliant on suport from OT) Standing balance-Leahy Scale: Poor Standing balance comment: Using this OT for support in standing.  ADL either performed or assessed with clinical judgement     Cognition  Arousal/Alertness: Awake/alert Behavior During Therapy: WFL for tasks assessed/performed Overall Cognitive Status: Within Functional Limits for tasks assessed                                          Exercises Exercises: General Upper Extremity General Exercises - Upper Extremity Shoulder Flexion: AROM, 5 reps, Seated (Min A needed to reach full range.) Shoulder Horizontal ABduction: AROM, 5 reps, Seated (x10 protraction as well)                 Pertinent Vitals/ Pain       Pain Assessment Pain Assessment: No/denies pain                                                     Frequency  Min 2X/week        Progress Toward Goals  OT Goals(current goals can now be found in the care plan section)  Progress towards OT goals: Progressing toward goals  Acute Rehab OT Goals Patient Stated Goal: return home OT Goal Formulation: With patient Time For Goal Achievement: 08/28/21 Potential to Achieve Goals: Fair ADL Goals Pt Will Perform Grooming: sitting;with supervision;with adaptive equipment Pt Will Perform Lower Body Bathing: with min guard assist;with min assist;sitting/lateral leans;with adaptive equipment Pt Will Perform Upper Body Dressing: with supervision;sitting Pt Will Perform Lower Body Dressing: with min assist;with min guard assist;with adaptive equipment;sitting/lateral leans Pt Will Transfer to Toilet: with min assist;stand pivot transfer Pt Will Perform Toileting - Clothing Manipulation and hygiene: with min assist;with mod assist;sitting/lateral leans;sit to/from stand Pt/caregiver will Perform Home Exercise Program: Increased ROM;Increased strength;Both right and left upper extremity;With Supervision  Plan Discharge plan remains appropriate                                    End of Session  Gait belt used.   OT Visit Diagnosis: Unsteadiness on feet (R26.81);Muscle weakness (generalized) (M62.81);Other  abnormalities of gait and mobility (R26.89);Other symptoms and signs involving cognitive function   Activity Tolerance Patient tolerated treatment well   Patient Left in chair;with call bell/phone within reach;with chair alarm set;with family/visitor present   Nurse Communication          Time: 4403-4742 OT Time Calculation (min): 15 min  Charges: OT General Charges $OT Visit: 1 Visit OT Treatments $Therapeutic Exercise: 8-22 mins  Jackquelyn Sundberg OT, MOT  Danie Chandler 08/19/2021, 10:43 AM

## 2021-08-19 NOTE — Progress Notes (Signed)
PROGRESS NOTE    Colleen Lee  FAO:[id number] DOB: [birth year] DOA: 08/13/2021 PCP: Salli Real, MD   Brief Narrative: This 66 years old female with PMH significant for alcoholic cirrhosis, [name], asthma presented in the ED by EMS for acute metabolic encephalopathy, She was seen recently by her neurologist who thought her weakness was secondary to chronic liver disease secondary to alcohol abuse, failure to thrive and thiamine deficiency.  CT head showed no acute intracranial abnormality. In the setting of alcoholic cirrhosis,  ammonia level was negative.  CT head showed advanced cerebral atrophy with chronic small vessel ischemic changes.She was started on IV fluids, folate and IV thiamine.  Patient was admitted for acute metabolic encephalopathy , stroke work-up is complete,  showed small new CVA.  Neurology is consulted, PT recommended skilled nursing facility.  Assessment & Plan:   Principal Problem:   Acute metabolic encephalopathy Active Problems:   Leukocytosis   Macrocytic anemia   Hyperkalemia   Failure to thrive (child)   Hypomagnesemia   Hypophosphatemia   Elevated troponin   Failure to thrive in adult   Encephalopathy   Protein-calorie malnutrition, severe   Pressure injury of skin   Acute metabolic encephalopathy possibly secondary to new CVA: Patient presented with confusion, lethargy, not following commands. MRI showed possible stroke.  Neurology was consulted. 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 EEG no signs of seizures.  Findings consistent with generalized slowing. Echocardiogram shows LVEF 65 to 70%.  Hemoglobin A1c 4.1.,  LDL 49. Continue aspirin.  Continue aggressive risk factor management. Neurology signed off,  recommended outpatient follow-up with [facility] in 4 weeks.  Generalized weakness: Could be secondary to CVA, failure to  thrive. PT evaluation recommended short-term rehab. Family has agreed to send the patient to short-term rehab  Hyperkalemia: Resolved with IV hydration.  Hypomagnesemia: Likely secondary to alcohol abuse. Replaced and resolved.  Hypophosphatemia: Improving, recheck a.m. labs  Macrocytic anemia: Likely due to alcohol abuse.  B12 is more than 1000  Essential hypertension: Continue metoprolol  Leucocytosis:  Patient remained afebrile. Improving  DVT prophylaxis: Lovenox. Code Status: Full code. Family Communication:Brother in the room. Disposition Plan:   Status is: Inpatient  Remains inpatient appropriate because: Admitted for metabolic encephalopathy found to have new stroke,  Neurology is consulted,  work-up is complete.  Patient is being discharged to SNF in 1 to 2 days  Consultants:   Neurology  Procedures: CT head, MRI, Echo  Antimicrobials:   Anti-infectives (From admission, onward)    None       Subjective: Patient was seen and examined at bedside.  Overnight events noted.   Patient was sitting on the chair comfortable,  reports feeling very weak and tired but denies any pain.  Objective: Vitals:   08/19/21 0029 08/19/21 0459 08/19/21 0601 08/19/21 0800  BP: 90/65 (!) 153/119 99/71 100/73  Pulse: 91 (!) 105 98 (!) 120  Resp: 18 16 18 18   Temp: 99 F (37.2 C) 98.9 F (37.2 C) 98.2 F (36.8 C) 97.7 F (36.5 C)  TempSrc: Oral Oral Oral Oral  SpO2: 100% 100% 100% 98%  Weight:      Height:        Intake/Output Summary (Last 24 hours) at 08/19/2021 1353 Last data filed at 08/19/2021 0900 Gross per 24 hour  Intake 680 ml  Output --  Net 680 ml   Filed Weights   08/14/21  8841 08/14/21 0510 08/15/21 0555  Weight: 63.5 kg 55.5 kg 57.6 kg    Examination:  General exam: Appears comfortable, chronically ill looking, not in any acute distress, deconditioned Respiratory system: Clear to auscultation bilaterally, respiratory effort normal, RR  15 Cardiovascular system: S1 & S2 heard, regular rate and rhythm, no murmur.   Gastrointestinal system: Abdomen is soft, nontender,  mildly distended, BS+ Central nervous system: Alert and oriented x 1. No focal neurological deficits. Extremities: No edema, no cyanosis, no clubbing. Skin: No rashes, lesions or ulcers Psychiatry:. Mood & affect appropriate.     Data Reviewed: I have personally reviewed following labs and imaging studies  CBC: Recent Labs  Lab 08/13/21 2250 08/13/21 2256 08/14/21 0342 08/17/21 0457  WBC 11.5*  --  11.2* 10.7*  NEUTROABS 9.4*  --   --   --   HGB 9.5* 10.9* 8.8* 8.2*  HCT 28.6* 32.0* 26.2* 26.5*  MCV 102.5*  --  103.6* 109.1*  PLT 225  --  207 208   Basic Metabolic Panel: Recent Labs  Lab 08/13/21 2250 08/13/21 2256 08/14/21 0342 08/14/21 0342 08/14/21 1209 08/15/21 0447 08/16/21 0500 08/17/21 0457 08/19/21 0601  NA 135 138 135  --   --  136 135 135  --   K 5.2* 5.1 4.3  --   --  4.2 3.0* 4.5  --   CL 111 111 113*  --   --  110 110 112*  --   CO2 19*  --  18*  --   --  18* 20* 18*  --   GLUCOSE 89 82 92  --   --  94 106* 95  --   BUN 16 15 14   --   --  10 5* <5*  --   CREATININE 0.96 0.90 0.72  --   --  0.67 0.55 0.59  --   CALCIUM 10.2  --  9.4  --   --  9.7 9.1 9.2  --   MG  --   --  1.6*  --  2.9* 2.4  --   --   --   PHOS  --   --  1.7*   < > 3.4 1.8* 1.8* 2.2* 6.4*   < > = values in this interval not displayed.   GFR: Estimated Creatinine Clearance: 62.9 mL/min (by C-G formula based on SCr of 0.59 mg/dL). Liver Function Tests: Recent Labs  Lab 08/13/21 2250 08/14/21 0342  AST 42* 34  ALT 18 16  ALKPHOS 256* 223*  BILITOT 1.1 1.0  PROT 6.4* 5.7*  ALBUMIN 2.0* 1.8*   No results for input(s): LIPASE, AMYLASE in the last 168 hours. Recent Labs  Lab 08/13/21 2250  AMMONIA 19   Coagulation Profile: Recent Labs  Lab 08/13/21 2250  INR 1.2   Cardiac Enzymes: No results for input(s): CKTOTAL, CKMB, CKMBINDEX,  TROPONINI in the last 168 hours. BNP (last 3 results) No results for input(s): PROBNP in the last 8760 hours. HbA1C: Recent Labs    08/18/21 0518  HGBA1C 4.1*   CBG: No results for input(s): GLUCAP in the last 168 hours. Lipid Profile: Recent Labs    08/18/21 0518  CHOL 79  HDL 14*  LDLCALC 49  TRIG 81  CHOLHDL 5.6   Thyroid Function Tests: No results for input(s): TSH, T4TOTAL, FREET4, T3FREE, THYROIDAB in the last 72 hours. Anemia Panel: No results for input(s): VITAMINB12, FOLATE, FERRITIN, TIBC, IRON, RETICCTPCT in the last 72 hours. Sepsis Labs: No results  for input(s): PROCALCITON, LATICACIDVEN in the last 168 hours.  Recent Results (from the past 240 hour(s))  Resp Panel by RT-PCR (Flu A&B, Covid) Nasopharyngeal Swab     Status: None   Collection Time: 08/13/21  9:54 PM   Specimen: Nasopharyngeal Swab; Nasopharyngeal(NP) swabs in vial transport medium  Result Value Ref Range Status   SARS Coronavirus 2 by RT PCR NEGATIVE NEGATIVE Final    Comment: (NOTE) SARS-CoV-2 target nucleic acids are NOT DETECTED.  The SARS-CoV-2 RNA is generally detectable in upper respiratory specimens during the acute phase of infection. The lowest concentration of SARS-CoV-2 viral copies this assay can detect is 138 copies/mL. A negative result does not preclude SARS-Cov-2 infection and should not be used as the sole basis for treatment or other patient management decisions. A negative result may occur with  improper specimen collection/handling, submission of specimen other than nasopharyngeal swab, presence of viral mutation(s) within the areas targeted by this assay, and inadequate number of viral copies(<138 copies/mL). A negative result must be combined with clinical observations, patient history, and epidemiological information. The expected result is Negative.  Fact Sheet for Patients:  BloggerCourse.com  Fact Sheet for Healthcare Providers:   SeriousBroker.it  This test is no t yet approved or cleared by the [place] FDA and  has been authorized for detection and/or diagnosis of SARS-CoV-2 by FDA under an Emergency Use Authorization (EUA). This EUA will remain  in effect (meaning this test can be used) for the duration of the COVID-19 declaration under Section 564(b)(1) of the Act, 21 U.S.C.section 360bbb-3(b)(1), unless the authorization is terminated  or revoked sooner.       Influenza A by PCR NEGATIVE NEGATIVE Final   Influenza B by PCR NEGATIVE NEGATIVE Final    Comment: (NOTE) The Xpert Xpress SARS-CoV-2/FLU/RSV plus assay is intended as an aid in the diagnosis of influenza from Nasopharyngeal swab specimens and should not be used as a sole basis for treatment. Nasal washings and aspirates are unacceptable for Xpert Xpress SARS-CoV-2/FLU/RSV testing.  Fact Sheet for Patients: BloggerCourse.com  Fact Sheet for Healthcare Providers: SeriousBroker.it  This test is not yet approved or cleared by the [place] FDA and has been authorized for detection and/or diagnosis of SARS-CoV-2 by FDA under an Emergency Use Authorization (EUA). This EUA will remain in effect (meaning this test can be used) for the duration of the COVID-19 declaration under Section 564(b)(1) of the Act, 21 U.S.C. section 360bbb-3(b)(1), unless the authorization is terminated or revoked.  Performed at [facility], [address], [place], [place] [address]     Radiology Studies: CT ANGIO HEAD NECK W WO CM  Result Date: 08/17/2021 CLINICAL DATA:  Stroke/TIA, determine embolic source. Right frontoparietal and cerebellar infarcts as well as abnormal appearance of the right vertebral artery on MRI. EXAM: CT ANGIOGRAPHY HEAD AND NECK TECHNIQUE: Multidetector CT imaging of the head and neck was performed using the standard protocol during bolus administration of  intravenous contrast. Multiplanar CT image reconstructions and MIPs were obtained to evaluate the vascular anatomy. Carotid stenosis measurements (when applicable) are obtained utilizing NASCET criteria, using the distal internal carotid diameter as the denominator. RADIATION DOSE REDUCTION: This exam was performed according to the departmental dose-optimization program which includes automated exposure control, adjustment of the mA and/or kV according to patient size and/or use of iterative reconstruction technique. CONTRAST:  OMNIPAQUE IOHEXOL 350 MG/ML SOLN COMPARISON:  Head MRI 08/17/2021 FINDINGS: CT HEAD FINDINGS Brain: There is a small acute infarct superiorly  in the right cerebellar hemisphere as shown on MRI. The subcentimeter right frontoparietal subcortical infarct on MRI is not well seen by CT. No intracranial hemorrhage, mass, midline shift, or extra-axial fluid collection is identified. There is mild cerebral atrophy. Periventricular white matter hypodensities are nonspecific but compatible with mild chronic small vessel ischemic disease. Vascular: Calcified atherosclerosis at the skull base. Skull: No fracture or suspicious osseous lesion. Sinuses: Unchanged fluid in the right sphenoid sinus. Trace right mastoid fluid. Orbits: Bilateral cataract extraction. Review of the MIP images confirms the above findings CTA NECK FINDINGS Aortic arch: Normal variant aortic arch branching pattern with common origin of the brachiocephalic and left common carotid arteries. Widely patent arch vessel origins. Right carotid system: Patent with a small amount calcified plaque at the carotid bifurcation. No evidence of a significant stenosis or dissection. Left carotid system: Patent with minimal atherosclerotic plaque at the carotid bifurcation and possible tiny web laterally in the proximal aspect of the carotid bulb. No evidence of a significant stenosis or dissection. Vertebral arteries: The vertebral arteries  are patent without evidence of a significant stenosis or dissection of the left vertebral artery which is strongly dominant. The right vertebral artery is diffusely small in caliber with a superimposed severe stenosis at its origin. Skeleton: Mild disc and moderate facet degeneration in the cervical spine. Other neck: No evidence of cervical lymphadenopathy or mass. Upper chest: Centrilobular emphysema. Partially visualized right PICC. Review of the MIP images confirms the above findings CTA HEAD FINDINGS Anterior circulation: Internal carotid arteries are patent from skull base to carotid termini. The cavernous carotid segments are tortuous with atherosclerotic calcification greater on the left not resulting in significant stenosis. ACAs and MCAs are patent without evidence of a proximal branch occlusion or significant proximal stenosis. No aneurysm is identified. Posterior circulation: The intracranial vertebral arteries are patent to the basilar with mild nonstenotic calcified plaque on the left. There is moderate to severe stenosis of the right vertebral artery proximal to the PICA origin. Patent PICA and SCA origins are seen bilaterally. The basilar artery is widely patent and tortuous. Posterior communicating arteries are diminutive or absent. Both PCAs are patent without evidence of a significant proximal stenosis. No aneurysm is identified. Venous sinuses: As permitted by contrast timing, patent. Anatomic variants: None. Review of the MIP images confirms the above findings IMPRESSION: 1. No large vessel occlusion. 2. 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 3. No significant stenosis of the strongly dominant left vertebral artery. 4. Minimal cervical carotid artery atherosclerosis without significant stenosis. 5. Emphysema (ICD10-J43.9). Electronically Signed   By: Sebastian Ache M.D.   On: 08/17/2021 14:53   EEG adult  Result Date: 08/17/2021 Joice Lofts, MD     08/17/2021 10:12 PM [facility] Routine EEG Report Patient Name:   Colleen Lee Date of Birth:   Nov 25, 1955 Identification Number:   MRN - [id number] Date of Study:   08/17/2021 17:49:17 Duration: 23 min Indication: Encephalopathy, Technical Summary: A routine 20 channel electroencephalogram using the international 10-20 system of electrode placement was performed. Background: 5-6 Hz, Poorly formed States      Drowsy: were seen during drowsiness Abnormalities Generalized Slowing: Diffuse generalized slowing Activation Procedures Hyperventilation: Not performed Photic Stimulation: Not performed Classification: Abnormal : Diagnosis: This is abnormal EEG, the Presence of Generalized slowing is consistent with Encephalopathy Dr Joice Lofts [facility] [phone] Case [id number]  ECHOCARDIOGRAM COMPLETE  Result Date: 08/17/2021  ECHOCARDIOGRAM REPORT   Patient Name:   Colleen Lee Date of Exam: 08/17/2021 Medical Rec #:  [id number]      Height:       66.0 in Accession #:    [id number]     Weight:       127.0 lb Date of Birth:  1955/10/15     BSA:          1.649 m Patient Age:    65 years       BP:           122/94 mmHg Patient Gender: F              HR:           101 bpm. [facility] Procedure: 2D Echo, Cardiac Doppler and Color Doppler Indications:    Stroke  History:        Patient has prior history of Echocardiogram examinations, most                 recent 07/06/2019. Risk Factors:Hypertension. ETOH Abuse.  Sonographer:    Mikki Harbor Referring Phys: [phone] ABRAHAM FELIZ ORTIZ IMPRESSIONS  1. Left ventricular ejection fraction, by estimation, is 65 to 70%. The left ventricle has normal function. The left ventricle has no regional wall motion abnormalities. There is mild left ventricular hypertrophy. Left ventricular diastolic parameters are consistent with Grade I diastolic dysfunction (impaired relaxation).  2.  Right ventricular systolic function is normal. The right ventricular size is normal. There is mildly elevated pulmonary artery systolic pressure.  3. The mitral valve is normal in structure. No evidence of mitral valve regurgitation. No evidence of mitral stenosis.  4. The aortic valve has an indeterminant number of cusps. Aortic valve regurgitation is not visualized. No aortic stenosis is present. FINDINGS  Left Ventricle: Left ventricular ejection fraction, by estimation, is 65 to 70%. The left ventricle has normal function. The left ventricle has no regional wall motion abnormalities. The left ventricular internal cavity size was normal in size. There is  mild left ventricular hypertrophy. Left ventricular diastolic parameters are consistent with Grade I diastolic dysfunction (impaired relaxation). Normal left ventricular filling pressure. Right Ventricle: The right ventricular size is normal. No increase in right ventricular wall thickness. Right ventricular systolic function is normal. There is mildly elevated pulmonary artery systolic pressure. The tricuspid regurgitant velocity is 2.82  m/s, and with an assumed right atrial pressure of 8 mmHg, the estimated right ventricular systolic pressure is 39.8 mmHg. Left Atrium: Left atrial size was normal in size. Right Atrium: Right atrial size was normal in size. Pericardium: There is no evidence of pericardial effusion. Mitral Valve: The mitral valve is normal in structure. No evidence of mitral valve regurgitation. No evidence of mitral valve stenosis. MV peak gradient, 8.0 mmHg. The mean mitral valve gradient is 3.0 mmHg. Tricuspid Valve: The tricuspid valve is not well visualized. Tricuspid valve regurgitation is mild . No evidence of tricuspid stenosis. Aortic Valve: The aortic valve has an indeterminant number of cusps. Aortic valve regurgitation is not visualized. No aortic stenosis is present. Aortic valve mean gradient measures 3.0 mmHg. Aortic valve peak  gradient measures 5.6 mmHg. Aortic valve area, by VTI measures 2.20 cm. Pulmonic Valve: The pulmonic valve was not well visualized. Pulmonic valve regurgitation is not visualized. No evidence of pulmonic stenosis. Aorta: The aortic root is normal in size and structure. IAS/Shunts: No atrial level shunt detected by color flow Doppler.  LEFT VENTRICLE PLAX  2D LVIDd:         3.10 cm     Diastology LVIDs:         2.00 cm     LV e' medial:    5.66 cm/s LV PW:         1.20 cm     LV E/e' medial:  11.8 LV IVS:        1.30 cm     LV e' lateral:   6.85 cm/s LVOT diam:     1.80 cm     LV E/e' lateral: 9.8 LV SV:         47 LV SV Index:   29 LVOT Area:     2.54 cm  LV Volumes (MOD) LV vol d, MOD A2C: 20.3 ml LV vol d, MOD A4C: 14.2 ml LV vol s, MOD A2C: 8.2 ml LV vol s, MOD A4C: 5.8 ml LV SV MOD A2C:     12.1 ml LV SV MOD A4C:     14.2 ml LV SV MOD BP:      10.1 ml RIGHT VENTRICLE RV Basal diam:  3.20 cm RV Mid diam:    2.80 cm RV S prime:     17.30 cm/s LEFT ATRIUM             Index        RIGHT ATRIUM           Index LA diam:        3.10 cm 1.88 cm/m   RA Area:     11.50 cm LA Vol (A2C):   30.9 ml 18.74 ml/m  RA Volume:   26.20 ml  15.89 ml/m LA Vol (A4C):   17.0 ml 10.31 ml/m LA Biplane Vol: 25.1 ml 15.22 ml/m  AORTIC VALVE                    PULMONIC VALVE AV Area (Vmax):    2.48 cm     PV Vmax:       0.75 m/s AV Area (Vmean):   2.42 cm     PV Peak grad:  2.3 mmHg AV Area (VTI):     2.20 cm AV Vmax:           118.00 cm/s AV Vmean:          79.000 cm/s AV VTI:            0.214 m AV Peak Grad:      5.6 mmHg AV Mean Grad:      3.0 mmHg LVOT Vmax:         115.00 cm/s LVOT Vmean:        75.100 cm/s LVOT VTI:          0.185 m LVOT/AV VTI ratio: 0.86  AORTA Ao Root diam: 3.10 cm Ao Asc diam:  3.20 cm MITRAL VALVE                TRICUSPID VALVE MV Area (PHT): 5.97 cm     TR Peak grad:   31.8 mmHg MV Area VTI:   2.54 cm     TR Vmax:        282.00 cm/s MV Peak grad:  8.0 mmHg MV Mean grad:  3.0 mmHg     SHUNTS MV Vmax:        1.41 m/s     Systemic VTI:  0.18 m MV Vmean:      75.5 cm/s    Systemic Diam:  1.80 cm MV Decel Time: 127 msec MV E velocity: 66.80 cm/s MV A velocity: 112.00 cm/s MV E/A ratio:  0.60 Dina Rich MD Electronically signed by Dina Rich MD Signature Date/Time: 08/17/2021/3:31:03 PM    Final     Scheduled Meds:  amLODipine  5 mg Oral Daily   aspirin EC  81 mg Oral Daily   Chlorhexidine Gluconate Cloth  6 each Topical Daily   feeding supplement  237 mL Oral TID BM   folic acid  1 mg Oral Daily   heparin injection (subcutaneous)  5,000 Units Subcutaneous Q8H   lactulose  10 g Oral BID   metoprolol tartrate  50 mg Oral BID   multivitamin with minerals  1 tablet Oral Daily   pantoprazole  40 mg Oral BID   phosphorus  500 mg Oral QID   sodium chloride flush  10-40 mL Intracatheter Q12H   thiamine  100 mg Oral Daily   Continuous Infusions:  dextrose 5 % and 0.45% NaCl 10 mL/hr at 08/17/21 0916     LOS: 5 days    Time spent: 45 mins    Cela Newcom, MD [facility]   If 7PM-7AM, please contact night-coverage

## 2021-08-20 LAB — BASIC METABOLIC PANEL
Anion gap: 14 (ref 5–15)
Anion gap: 12 (ref 5–15)
BUN: 8 mg/dL (ref 8–23)
BUN: 9 mg/dL (ref 8–23)
CO2: 19 mmol/L — ABNORMAL LOW (ref 22–32)
CO2: 17 mmol/L — ABNORMAL LOW (ref 22–32)
Calcium: 7 mg/dL — ABNORMAL LOW (ref 8.9–10.3)
Calcium: 6.8 mg/dL — ABNORMAL LOW (ref 8.9–10.3)
Chloride: 106 mmol/L (ref 98–111)
Chloride: 108 mmol/L (ref 98–111)
Creatinine, Ser: 1.43 mg/dL — ABNORMAL HIGH (ref 0.44–1.00)
Creatinine, Ser: 1.38 mg/dL — ABNORMAL HIGH (ref 0.44–1.00)
GFR, Estimated: 40 mL/min — ABNORMAL LOW (ref >60)
GFR, Estimated: 42 mL/min — ABNORMAL LOW (ref >60)
Glucose, Bld: 112 mg/dL — ABNORMAL HIGH (ref 70–99)
Glucose, Bld: 170 mg/dL — ABNORMAL HIGH (ref 70–99)
Potassium: 2 mmol/L — CL (ref 3.5–5.1)
Potassium: 3.1 mmol/L — ABNORMAL LOW (ref 3.5–5.1)
Sodium: 139 mmol/L (ref 135–145)
Sodium: 137 mmol/L (ref 135–145)

## 2021-08-20 LAB — CBC
HCT: 22.3 % — ABNORMAL LOW (ref 36.0–46.0)
Hemoglobin: 7.6 g/dL — ABNORMAL LOW (ref 12.0–15.0)
MCH: 33.8 pg (ref 26.0–34.0)
MCHC: 34.1 g/dL (ref 30.0–36.0)
MCV: 99.1 fL (ref 80.0–100.0)
Platelets: 241 10*3/uL (ref 150–400)
RBC: 2.25 MIL/uL — ABNORMAL LOW (ref 3.87–5.11)
RDW: 16.3 % — ABNORMAL HIGH (ref 11.5–15.5)
WBC: 10.8 10*3/uL — ABNORMAL HIGH (ref 4.0–10.5)
nRBC: 0 % (ref 0.0–0.2)

## 2021-08-20 LAB — HEMOGLOBIN AND HEMATOCRIT, BLOOD
HCT: 21.5 % — ABNORMAL LOW (ref 36.0–46.0)
Hemoglobin: 7.2 g/dL — ABNORMAL LOW (ref 12.0–15.0)

## 2021-08-20 LAB — MAGNESIUM: Magnesium: 1.1 mg/dL — ABNORMAL LOW (ref 1.7–2.4)

## 2021-08-20 LAB — PHOSPHORUS: Phosphorus: 6.1 mg/dL — ABNORMAL HIGH (ref 2.5–4.6)

## 2021-08-20 MED ORDER — SODIUM CHLORIDE 0.9 % IV BOLUS
500.0000 mL | Freq: Once | INTRAVENOUS | Status: AC
  Administered 2021-08-20: 500 mL via INTRAVENOUS

## 2021-08-20 MED ORDER — CALCIUM ACETATE (PHOS BINDER) 667 MG PO CAPS
667.0000 mg | ORAL_CAPSULE | Freq: Three times a day (TID) | ORAL | Status: DC
  Administered 2021-08-20 (×2): 667 mg via ORAL
  Filled 2021-08-20 (×2): qty 1

## 2021-08-20 MED ORDER — POTASSIUM CHLORIDE 10 MEQ/100ML IV SOLN
10.0000 meq | INTRAVENOUS | Status: AC
  Administered 2021-08-20 (×4): 10 meq via INTRAVENOUS
  Filled 2021-08-20 (×4): qty 100

## 2021-08-20 MED ORDER — SODIUM CHLORIDE 0.9 % IV SOLN: INTRAVENOUS | Status: AC

## 2021-08-20 MED ORDER — POTASSIUM CHLORIDE CRYS ER 20 MEQ PO TBCR
40.0000 meq | EXTENDED_RELEASE_TABLET | Freq: Two times a day (BID) | ORAL | Status: AC
  Administered 2021-08-20 (×2): 40 meq via ORAL
  Filled 2021-08-20 (×2): qty 2

## 2021-08-20 MED ORDER — MAGNESIUM SULFATE 2 GM/50ML IV SOLN
2.0000 g | Freq: Once | INTRAVENOUS | Status: AC
  Administered 2021-08-20: 2 g via INTRAVENOUS
  Filled 2021-08-20: qty 50

## 2021-08-20 NOTE — Progress Notes (Signed)
Received critical lab value: potassium 2.0. MD paged. Awaiting new orders.

## 2021-08-20 NOTE — Progress Notes (Signed)
PROGRESS NOTE    Colleen Lee  ZOX:[id number] DOB: 1956-01-11 DOA: 08/13/2021 PCP: Salli Real, MD   Brief Narrative: This 66 years old female with PMH significant for alcoholic cirrhosis, [name], asthma presented in the ED by EMS for acute metabolic encephalopathy, She was seen recently by her neurologist who thought her weakness was secondary to chronic liver disease secondary to alcohol abuse, failure to thrive and thiamine deficiency.  CT head showed no acute intracranial abnormality. In the setting of alcoholic cirrhosis,  ammonia level was negative.  CT head showed advanced cerebral atrophy with chronic small vessel ischemic changes.She was started on IV fluids, folate and IV thiamine.  Patient was admitted for acute metabolic encephalopathy , stroke work-up is complete,  showed small new CVA.  Neurology is consulted, PT recommended skilled nursing facility.  Assessment & Plan:   Principal Problem:   Acute metabolic encephalopathy Active Problems:   Leukocytosis   Macrocytic anemia   Hyperkalemia   Failure to thrive (child)   Hypomagnesemia   Hypophosphatemia   Elevated troponin   Failure to thrive in adult   Encephalopathy   Protein-calorie malnutrition, severe   Pressure injury of skin   Acute metabolic encephalopathy possibly secondary to new CVA: Patient presented with confusion, lethargy, not following commands. MRI showed possible stroke.  Neurology was consulted. 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 EEG no evidence of seizures.  Findings consistent with generalized slowing. Echocardiogram showed LVEF 65 to 70%.  Hemoglobin A1c 4.1.  LDL 49. Continue aspirin.  Continue aggressive risk factor management. Neurology signed off,  recommended outpatient follow-up with [facility] in 4 weeks.  Generalized weakness: Could be secondary to CVA, failure to  thrive. PT evaluation recommended short-term rehab. Family has agreed to send the patient to short-term rehab  Hyperkalemia: Resolved with IV hydration.  Hypokalemia: Replaced.  Continue to monitor  Hypomagnesemia: Likely secondary to alcohol abuse. Replaced. Continue to monitor.  Hyperphosphatemia: Improving, recheck a.m. labs  Macrocytic anemia: Likely due to alcohol abuse.  B12 is more than 1000  Essential hypertension: Continue metoprolol  Leucocytosis:  Patient remained afebrile. Improving  DVT prophylaxis: Lovenox. Code Status: Full code. Family Communication:Brother in the room. Disposition Plan:   Status is: Inpatient  Remains inpatient appropriate because: Admitted for metabolic encephalopathy found to have new stroke,  Neurology is consulted,  work-up is complete.  Patient is being discharged to SNF in 1 to 2 days  Consultants:   Neurology  Procedures: CT head, MRI, Echo  Antimicrobials:   Anti-infectives (From admission, onward)    None       Subjective: Patient was seen and examined at bedside.  Overnight events noted.   Patient was lying on the bed, appears comfortable but appears weak and tired.  Appears deconditioned   Objective: Vitals:   08/19/21 2245 08/20/21 0506 08/20/21 0539 08/20/21 0819  BP:  (!) 86/64 92/72 105/74  Pulse: 67 (!) 102 92 (!) 108  Resp:    18  Temp:  98.2 F (36.8 C)  98 F (36.7 C)  TempSrc:    Axillary  SpO2:  100%  100%  Weight:      Height:        Intake/Output Summary (Last 24 hours) at 08/20/2021 1236 Last data filed at 08/20/2021 0953 Gross per 24 hour  Intake 1120 ml  Output 350 ml  Net 770 ml   [employer]  08/14/21 0352 08/14/21 0510 08/15/21 0555  Weight: 63.5 kg 55.5 kg 57.6 kg    Examination:  General exam: Appears deconditioned, chronically ill looking, not in any acute distress, comfortable. Respiratory system: Clear to auscultation bilaterally, respiratory effort normal, RR  13 Cardiovascular system: S1 & S2 heard, regular rate and rhythm, no murmur.   Gastrointestinal system: Abdomen is soft, nontender,  mildly distended, BS+ Central nervous system: Alert and oriented x 2. No focal neurological deficits. Extremities: No edema, no cyanosis, no clubbing. Skin: No rashes, lesions or ulcers Psychiatry:. Mood & affect appropriate.     Data Reviewed: I have personally reviewed following labs and imaging studies  CBC: Recent Labs  Lab 08/13/21 2250 08/13/21 2256 08/14/21 0342 08/17/21 0457 08/20/21 0538  WBC 11.5*  --  11.2* 10.7* 10.8*  NEUTROABS 9.4*  --   --   --   --   HGB 9.5* 10.9* 8.8* 8.2* 7.6*  HCT 28.6* 32.0* 26.2* 26.5* 22.3*  MCV 102.5*  --  103.6* 109.1* 99.1  PLT 225  --  207 208 241   Basic Metabolic Panel: Recent Labs  Lab 08/14/21 0342 08/14/21 1209 08/15/21 0447 08/16/21 0500 08/17/21 0457 08/19/21 0601 08/20/21 0538  NA 135  --  136 135 135  --  139  K 4.3  --  4.2 3.0* 4.5  --  2.0*  CL 113*  --  110 110 112*  --  106  CO2 18*  --  18* 20* 18*  --  19*  GLUCOSE 92  --  94 106* 95  --  112*  BUN 14  --  10 5* <5*  --  8  CREATININE 0.72  --  0.67 0.55 0.59  --  1.43*  CALCIUM 9.4  --  9.7 9.1 9.2  --  7.0*  MG 1.6* 2.9* 2.4  --   --   --  1.1*  PHOS 1.7* 3.4 1.8* 1.8* 2.2* 6.4* 6.1*   GFR: Estimated Creatinine Clearance: 35.2 mL/min (A) (by C-G formula based on SCr of 1.43 mg/dL (H)). Liver Function Tests: Recent Labs  Lab 08/13/21 2250 08/14/21 0342  AST 42* 34  ALT 18 16  ALKPHOS 256* 223*  BILITOT 1.1 1.0  PROT 6.4* 5.7*  ALBUMIN 2.0* 1.8*   No results for input(s): LIPASE, AMYLASE in the last 168 hours. Recent Labs  Lab 08/13/21 2250  AMMONIA 19   Coagulation Profile: Recent Labs  Lab 08/13/21 2250  INR 1.2   Cardiac Enzymes: No results for input(s): CKTOTAL, CKMB, CKMBINDEX, TROPONINI in the last 168 hours. BNP (last 3 results) No results for input(s): PROBNP in the last 8760  hours. HbA1C: Recent Labs    08/18/21 0518  HGBA1C 4.1*   CBG: No results for input(s): GLUCAP in the last 168 hours. Lipid Profile: Recent Labs    08/18/21 0518  CHOL 79  HDL 14*  LDLCALC 49  TRIG 81  CHOLHDL 5.6   Thyroid Function Tests: No results for input(s): TSH, T4TOTAL, FREET4, T3FREE, THYROIDAB in the last 72 hours. Anemia Panel: No results for input(s): VITAMINB12, FOLATE, FERRITIN, TIBC, IRON, RETICCTPCT in the last 72 hours. Sepsis Labs: No results for input(s): PROCALCITON, LATICACIDVEN in the last 168 hours.  Recent Results (from the past 240 hour(s))  Resp Panel by RT-PCR (Flu A&B, Covid) Nasopharyngeal Swab     Status: None   Collection Time: 08/13/21  9:54 PM   Specimen: Nasopharyngeal Swab; Nasopharyngeal(NP) swabs in vial transport medium  Result Value  Ref Range Status   SARS Coronavirus 2 by RT PCR NEGATIVE NEGATIVE Final    Comment: (NOTE) SARS-CoV-2 target nucleic acids are NOT DETECTED.  The SARS-CoV-2 RNA is generally detectable in upper respiratory specimens during the acute phase of infection. The lowest concentration of SARS-CoV-2 viral copies this assay can detect is 138 copies/mL. A negative result does not preclude SARS-Cov-2 infection and should not be used as the sole basis for treatment or other patient management decisions. A negative result may occur with  improper specimen collection/handling, submission of specimen other than nasopharyngeal swab, presence of viral mutation(s) within the areas targeted by this assay, and inadequate number of viral copies(<138 copies/mL). A negative result must be combined with clinical observations, patient history, and epidemiological information. The expected result is Negative.  Fact Sheet for Patients:  BloggerCourse.com  Fact Sheet for Healthcare Providers:  SeriousBroker.it  This test is no t yet approved or cleared by the [place] FDA  and  has been authorized for detection and/or diagnosis of SARS-CoV-2 by FDA under an Emergency Use Authorization (EUA). This EUA will remain  in effect (meaning this test can be used) for the duration of the COVID-19 declaration under Section 564(b)(1) of the Act, 21 U.S.C.section 360bbb-3(b)(1), unless the authorization is terminated  or revoked sooner.       Influenza A by PCR NEGATIVE NEGATIVE Final   Influenza B by PCR NEGATIVE NEGATIVE Final    Comment: (NOTE) The Xpert Xpress SARS-CoV-2/FLU/RSV plus assay is intended as an aid in the diagnosis of influenza from Nasopharyngeal swab specimens and should not be used as a sole basis for treatment. Nasal washings and aspirates are unacceptable for Xpert Xpress SARS-CoV-2/FLU/RSV testing.  Fact Sheet for Patients: BloggerCourse.com  Fact Sheet for Healthcare Providers: SeriousBroker.it  This test is not yet approved or cleared by the [place] FDA and has been authorized for detection and/or diagnosis of SARS-CoV-2 by FDA under an Emergency Use Authorization (EUA). This EUA will remain in effect (meaning this test can be used) for the duration of the COVID-19 declaration under Section 564(b)(1) of the Act, 21 U.S.C. section 360bbb-3(b)(1), unless the authorization is terminated or revoked.  Performed at [facility], [address] [place], [place] [address]     Radiology Studies: No results found.  Scheduled Meds:  amLODipine  5 mg Oral Daily   aspirin EC  81 mg Oral Daily   calcium acetate  667 mg Oral TID WC   Chlorhexidine Gluconate Cloth  6 each Topical Daily   feeding supplement  237 mL Oral TID BM   folic acid  1 mg Oral Daily   heparin injection (subcutaneous)  5,000 Units Subcutaneous Q8H   lactulose  10 g Oral BID   metoprolol tartrate  50 mg Oral BID   multivitamin with minerals  1 tablet Oral Daily   pantoprazole  40 mg Oral BID   potassium chloride   40 mEq Oral BID   sodium chloride flush  10-40 mL Intracatheter Q12H   thiamine  100 mg Oral Daily   Continuous Infusions:  sodium chloride     dextrose 5 % and 0.45% NaCl 10 mL/hr at 08/17/21 0916   magnesium sulfate bolus IVPB 2 g (08/20/21 1141)   potassium chloride 10 mEq (08/20/21 1140)     LOS: 6 days    Time spent: 35 mins    Habiba Treloar, MD [facility]   If 7PM-7AM, please contact night-coverage

## 2021-08-21 LAB — MAGNESIUM: Magnesium: 1.6 mg/dL — ABNORMAL LOW (ref 1.7–2.4)

## 2021-08-21 LAB — HEMOGLOBIN AND HEMATOCRIT, BLOOD
HCT: 29.3 % — ABNORMAL LOW (ref 36.0–46.0)
Hemoglobin: 9.8 g/dL — ABNORMAL LOW (ref 12.0–15.0)

## 2021-08-21 LAB — PREPARE RBC (CROSSMATCH)

## 2021-08-21 LAB — CBC
HCT: 20.2 % — ABNORMAL LOW (ref 36.0–46.0)
Hemoglobin: 6.6 g/dL — CL (ref 12.0–15.0)
MCH: 32.2 pg (ref 26.0–34.0)
MCHC: 32.7 g/dL (ref 30.0–36.0)
MCV: 98.5 fL (ref 80.0–100.0)
Platelets: 222 10*3/uL (ref 150–400)
RBC: 2.05 MIL/uL — ABNORMAL LOW (ref 3.87–5.11)
RDW: 16 % — ABNORMAL HIGH (ref 11.5–15.5)
WBC: 9.7 10*3/uL (ref 4.0–10.5)
nRBC: 0 % (ref 0.0–0.2)

## 2021-08-21 LAB — BASIC METABOLIC PANEL
Anion gap: 9 (ref 5–15)
BUN: 9 mg/dL (ref 8–23)
CO2: 18 mmol/L — ABNORMAL LOW (ref 22–32)
Calcium: 7.2 mg/dL — ABNORMAL LOW (ref 8.9–10.3)
Chloride: 111 mmol/L (ref 98–111)
Creatinine, Ser: 1.18 mg/dL — ABNORMAL HIGH (ref 0.44–1.00)
GFR, Estimated: 51 mL/min — ABNORMAL LOW (ref >60)
Glucose, Bld: 82 mg/dL (ref 70–99)
Potassium: 3.8 mmol/L (ref 3.5–5.1)
Sodium: 138 mmol/L (ref 135–145)

## 2021-08-21 LAB — PHOSPHORUS: Phosphorus: 2.4 mg/dL — ABNORMAL LOW (ref 2.5–4.6)

## 2021-08-21 MED ORDER — MAGNESIUM SULFATE 2 GM/50ML IV SOLN
2.0000 g | Freq: Once | INTRAVENOUS | Status: AC
  Administered 2021-08-21: 2 g via INTRAVENOUS
  Filled 2021-08-21: qty 50

## 2021-08-21 MED ORDER — SODIUM CHLORIDE 0.9% IV SOLUTION: Freq: Once | INTRAVENOUS | Status: AC

## 2021-08-21 MED ORDER — ALPRAZOLAM 0.5 MG PO TABS
0.5000 mg | ORAL_TABLET | Freq: Four times a day (QID) | ORAL | Status: DC | PRN
  Administered 2021-08-22 (×2): 0.5 mg via ORAL
  Filled 2021-08-21 (×2): qty 1

## 2021-08-21 NOTE — Progress Notes (Signed)
PROGRESS NOTE    Colleen Lee  ZOX:[id number] DOB: 1955/08/30 DOA: 08/13/2021 PCP: Salli Real, MD   Brief Narrative: This 66 years old female with PMH significant for alcoholic cirrhosis, [name], asthma presented in the ED by EMS for acute metabolic encephalopathy, She was seen recently by her neurologist who thought her weakness was secondary to chronic liver disease secondary to alcohol abuse, failure to thrive and thiamine deficiency.  CT head showed no acute intracranial abnormality. In the setting of alcoholic cirrhosis,  ammonia level was negative.  CT head showed advanced cerebral atrophy with chronic small vessel ischemic changes.She was started on IV fluids, folate and IV thiamine.  Patient was admitted for acute metabolic encephalopathy , stroke work-up is complete,  showed small new CVA.  Neurology is consulted, PT recommended skilled nursing facility.  Assessment & Plan:   Principal Problem:   Acute metabolic encephalopathy Active Problems:   Leukocytosis   Macrocytic anemia   Hyperkalemia   Failure to thrive (child)   Hypomagnesemia   Hypophosphatemia   Elevated troponin   Failure to thrive in adult   Encephalopathy   Protein-calorie malnutrition, severe   Pressure injury of skin   Acute metabolic encephalopathy possibly secondary to new CVA: Patient presented with confusion, lethargy, not following commands. MRI showed possible stroke.  Neurology was consulted. 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 EEG no evidence of seizures.  Findings consistent with generalized slowing. Echocardiogram showed LVEF 65 to 70%.  Hemoglobin A1c 4.1.  LDL 49. Continue aspirin.  Continue aggressive risk factor management. Neurology signed off,  recommended outpatient follow-up with [facility] in 4 weeks.  Generalized weakness: Could be secondary to CVA, failure to  thrive. PT evaluation recommended short-term rehab. Family has agreed to send the patient to short-term rehab  Hyperkalemia: Resolved with IV hydration.  Hypokalemia: Replaced.  Resolved.  Hypomagnesemia: Likely secondary to alcohol abuse. Replaced. Continue to monitor.  Hyperphosphatemia: Improving, recheck a.m. labs  Macrocytic anemia: Likely due to alcohol abuse.  B12 is more than 1000. Hb dropped from 7.6> 6.6, no signs of any visible bleeding. Transfuse 1 unit PRBC.  Follow-up post transf Hb.  Essential hypertension: Continue metoprolol  Leucocytosis:  Patient remained afebrile. Improving  DVT prophylaxis: Lovenox. Code Status: Full code. Family Communication:Brother in the room. Disposition Plan:   Status is: Inpatient  Remains inpatient appropriate because:  Admitted for metabolic encephalopathy found to have new stroke,  Neurology is consulted,  work-up is complete.   Patient is being discharged to SNF in 1 to 2 days  Consultants:   Neurology  Procedures: CT head, MRI, Echo  Antimicrobials:   Anti-infectives (From admission, onward)    None       Subjective: Patient was seen and examined at bedside.  Overnight events noted.   Patient was lying in bed  comfortably, appears chronically ill looking, weak and tired. She reports feeling better but still has lot of weakness.   Objective: Vitals:   08/20/21 2347 08/21/21 0511 08/21/21 0926 08/21/21 1115  BP:  96/65 120/86 95/70  Pulse: (!) 102 100 (!) 109 (!) 108  Resp:  20 18 18   Temp:  98.1 F (36.7 C) 98.4 F (36.9 C) 98.2 F (36.8 C)  TempSrc:  Oral Oral Axillary  SpO2:  98% 97% 100%  Weight:      Height:        Intake/Output Summary (Last 24  hours) at 08/21/2021 1141 Last data filed at 08/21/2021 1043 Gross per 24 hour  Intake 1353.01 ml  Output 600 ml  Net 753.01 ml   [employer]   08/14/21 0352 08/14/21 0510 08/15/21 0555  Weight: 63.5 kg 55.5 kg 57.6 kg     Examination:  General exam: Appears chronically ill looking, deconditioned, not in any acute distress, comfortable. Respiratory system: Clear to auscultation bilaterally, respiratory effort normal, RR 13 Cardiovascular system: S1 & S2 heard, regular rate and rhythm, no murmur.   Gastrointestinal system: Abdomen is soft, nontender,  mildly distended, BS+ Central nervous system: Alert and oriented x 2. No focal neurological deficits. Extremities: No edema, no cyanosis, no clubbing. Skin: No rashes, lesions or ulcers Psychiatry:. Mood & affect appropriate.     Data Reviewed: I have personally reviewed following labs and imaging studies  CBC: Recent Labs  Lab 08/17/21 0457 08/20/21 0538 08/20/21 1310 08/21/21 0451  WBC 10.7* 10.8*  --  9.7  HGB 8.2* 7.6* 7.2* 6.6*  HCT 26.5* 22.3* 21.5* 20.2*  MCV 109.1* 99.1  --  98.5  PLT 208 241  --  222   Basic Metabolic Panel: Recent Labs  Lab 08/14/21 1209 08/14/21 1209 08/15/21 0447 08/16/21 0500 08/17/21 0457 08/19/21 0601 08/20/21 0538 08/20/21 1310 08/21/21 0451  NA  --    < > 136 135 135  --  139 137 138  K  --    < > 4.2 3.0* 4.5  --  2.0* 3.1* 3.8  CL  --    < > 110 110 112*  --  106 108 111  CO2  --    < > 18* 20* 18*  --  19* 17* 18*  GLUCOSE  --    < > 94 106* 95  --  112* 170* 82  BUN  --    < > 10 5* <5*  --  8 9 9   CREATININE  --    < > 0.67 0.55 0.59  --  1.43* 1.38* 1.18*  CALCIUM  --    < > 9.7 9.1 9.2  --  7.0* 6.8* 7.2*  MG 2.9*  --  2.4  --   --   --  1.1*  --  1.6*  PHOS 3.4  --  1.8* 1.8* 2.2* 6.4* 6.1*  --  2.4*   < > = values in this interval not displayed.   GFR: Estimated Creatinine Clearance: 42.6 mL/min (A) (by C-G formula based on SCr of 1.18 mg/dL (H)). Liver Function Tests: No results for input(s): AST, ALT, ALKPHOS, BILITOT, PROT, ALBUMIN in the last 168 hours.  No results for input(s): LIPASE, AMYLASE in the last 168 hours. No results for input(s): AMMONIA in the last 168  hours.  Coagulation Profile: No results for input(s): INR, PROTIME in the last 168 hours.  Cardiac Enzymes: No results for input(s): CKTOTAL, CKMB, CKMBINDEX, TROPONINI in the last 168 hours. BNP (last 3 results) No results for input(s): PROBNP in the last 8760 hours. HbA1C: No results for input(s): HGBA1C in the last 72 hours.  CBG: No results for input(s): GLUCAP in the last 168 hours. Lipid Profile: No results for input(s): CHOL, HDL, LDLCALC, TRIG, CHOLHDL, LDLDIRECT in the last 72 hours.  Thyroid Function Tests: No results for input(s): TSH, T4TOTAL, FREET4, T3FREE, THYROIDAB in the last 72 hours. Anemia Panel: No results for input(s): VITAMINB12, FOLATE, FERRITIN, TIBC, IRON, RETICCTPCT in the last 72 hours. Sepsis Labs: No results for input(s): PROCALCITON, LATICACIDVEN  in the last 168 hours.  Recent Results (from the past 240 hour(s))  Resp Panel by RT-PCR (Flu A&B, Covid) Nasopharyngeal Swab     Status: None   Collection Time: 08/13/21  9:54 PM   Specimen: Nasopharyngeal Swab; Nasopharyngeal(NP) swabs in vial transport medium  Result Value Ref Range Status   SARS Coronavirus 2 by RT PCR NEGATIVE NEGATIVE Final    Comment: (NOTE) SARS-CoV-2 target nucleic acids are NOT DETECTED.  The SARS-CoV-2 RNA is generally detectable in upper respiratory specimens during the acute phase of infection. The lowest concentration of SARS-CoV-2 viral copies this assay can detect is 138 copies/mL. A negative result does not preclude SARS-Cov-2 infection and should not be used as the sole basis for treatment or other patient management decisions. A negative result may occur with  improper specimen collection/handling, submission of specimen other than nasopharyngeal swab, presence of viral mutation(s) within the areas targeted by this assay, and inadequate number of viral copies(<138 copies/mL). A negative result must be combined with clinical observations, patient history, and  epidemiological information. The expected result is Negative.  Fact Sheet for Patients:  BloggerCourse.com  Fact Sheet for Healthcare Providers:  SeriousBroker.it  This test is no t yet approved or cleared by the [place] FDA and  has been authorized for detection and/or diagnosis of SARS-CoV-2 by FDA under an Emergency Use Authorization (EUA). This EUA will remain  in effect (meaning this test can be used) for the duration of the COVID-19 declaration under Section 564(b)(1) of the Act, 21 U.S.C.section 360bbb-3(b)(1), unless the authorization is terminated  or revoked sooner.       Influenza A by PCR NEGATIVE NEGATIVE Final   Influenza B by PCR NEGATIVE NEGATIVE Final    Comment: (NOTE) The Xpert Xpress SARS-CoV-2/FLU/RSV plus assay is intended as an aid in the diagnosis of influenza from Nasopharyngeal swab specimens and should not be used as a sole basis for treatment. Nasal washings and aspirates are unacceptable for Xpert Xpress SARS-CoV-2/FLU/RSV testing.  Fact Sheet for Patients: BloggerCourse.com  Fact Sheet for Healthcare Providers: SeriousBroker.it  This test is not yet approved or cleared by the [place] FDA and has been authorized for detection and/or diagnosis of SARS-CoV-2 by FDA under an Emergency Use Authorization (EUA). This EUA will remain in effect (meaning this test can be used) for the duration of the COVID-19 declaration under Section 564(b)(1) of the Act, 21 U.S.C. section 360bbb-3(b)(1), unless the authorization is terminated or revoked.  Performed at [facility], [address], [place], [place] [address]     Radiology Studies: No results found.  Scheduled Meds:  amLODipine  5 mg Oral Daily   aspirin EC  81 mg Oral Daily   Chlorhexidine Gluconate Cloth  6 each Topical Daily   feeding supplement  237 mL Oral TID BM   folic acid  1 mg  Oral Daily   heparin injection (subcutaneous)  5,000 Units Subcutaneous Q8H   lactulose  10 g Oral BID   metoprolol tartrate  50 mg Oral BID   multivitamin with minerals  1 tablet Oral Daily   pantoprazole  40 mg Oral BID   sodium chloride flush  10-40 mL Intracatheter Q12H   thiamine  100 mg Oral Daily   Continuous Infusions:  dextrose 5 % and 0.45% NaCl Stopped (08/20/21 1003)     LOS: 7 days    Time spent: 35 mins    Margie Brink, MD [facility]   If 7PM-7AM, please contact night-coverage

## 2021-08-21 NOTE — Care Management Important Message (Signed)
Important Message  Patient Details  Name: Colleen Lee MRN: [id number] Date of Birth: 20-Jan-1956   Medicare Important Message Given:  Yes     Tommy Medal 08/21/2021, 2:38 PM

## 2021-08-21 NOTE — TOC Progression Note (Addendum)
Transition of Care [facility]) - Progression Note    Patient Details  Name: Colleen Lee MRN: [id number] Date of Birth: 10/11/1955  Transition of Care [facility]) CM/SW Contact  Boneta Lucks, RN Phone Number: 08/21/2021, 3:29 PM  Clinical Narrative:   Patient needing blood today. Unstable to discharge to [facility]) Insurance authorization expired. TOC requested new PT note to restart INS AUTH. TOC sending clinicals, still waiting PT note. TOC to follow for PT note to send to [place]. Debbie updated at [facility] with weekend discharge plan.  CM called Lily, patient sister to confirm this is still the discharge plan. [facility] and [facility] holding orders in case patient was going home.  Tim Lair states they do want her to go to [facility] for 20 days.   Expected Discharge Plan: [place] Barriers to Discharge: Continued Medical Work up  Expected Discharge Plan and Services Expected Discharge Plan: [place] In-house Referral: Clinical Social Work   Post Acute Care Choice: [place] Living arrangements for the past 2 months: [place]

## 2021-08-21 NOTE — Progress Notes (Signed)
Physical Therapy Treatment Patient Details Name: Colleen Lee MRN: [id number] DOB: 02-14-1956 Today's Date: 08/21/2021   History of Present Illness Colleen Lee is a 66 y.o. female with medical history significant for alcoholic liver cirrhosis, [name], asthma who presents to the emergency department via EMS due to change in mental status.  Patient was unable to provide history, history was obtained from ED physician and sister at bedside.  Per sister, patient has had 1 week of an increased and worsening weakness.  Patient was noted to be very weak today that she was not even communicative, she was noted just staring at the ceiling without responding to the sister, so EMS was activated.  Patient was noted with some waxing and waning of mental status en route to the ED.  Patient was evaluated by a neurologist (Dr. Leta Baptist) 2 days ago due to the weakness which was deemed to be likely due to patient's history of chronic liver disease, chronic alcohol abuse, failure to thrive and thiamine deficiency.  She was admitted on 12/12 and discharged on 12/15 2022 due to acute metabolic encephalopathy, intractable vomiting and SIRS    PT Comments    Patient present alert and requires encouragement to participate due to apprehension and c/o fatigue.  Patient demonstrates slow labored movement for sitting up at bedside requiring repeated attempts due to frequent leaning backwards and poor carryover for supporting self with BUE, once seated patient able to maintain sitting balance and tolerated completing LAQ's without loss of balance. Transfers not attempted due to patient having HR up to 160 per RN and put back to bed.  Patient will benefit from continued skilled physical therapy in hospital and recommended venue below to increase strength, balance, endurance for safe ADLs and gait.     Recommendations for follow up therapy are one component of a multi-disciplinary discharge planning process, led by the  attending physician.  Recommendations may be updated based on patient status, additional functional criteria and insurance authorization.  Follow Up Recommendations  Skilled nursing-short term rehab (<3 hours/day)     Assistance Recommended at Discharge Intermittent Supervision/Assistance  Patient can return home with the following A lot of help with bathing/dressing/bathroom;A lot of help with walking and/or transfers;Help with stairs or ramp for entrance;Two people to help with walking and/or transfers   Equipment Recommendations  Rolling walker (2 wheels)    Recommendations for Other Services       Precautions / Restrictions Precautions Precautions: Fall Restrictions Weight Bearing Restrictions: No     Mobility  Bed Mobility Overal bed mobility: Needs Assistance Bed Mobility: Supine to Sit, Sit to Supine     Supine to sit: Mod assist Sit to supine: Mod assist   General bed mobility comments: slow labored movement    Transfers Overall transfer level: Needs assistance Equipment used: 1 person hand held assist Transfers: Sit to/from Stand Sit to Stand: Max assist           General transfer comment: Max to partial stand with Hand held assist    Ambulation/Gait                   Stairs             Wheelchair Mobility    Modified Rankin (Stroke Patients Only)       Balance Overall balance assessment: Needs assistance Sitting-balance support: Feet supported, Bilateral upper extremity supported Sitting balance-Leahy Scale: Fair Sitting balance - Comments: seated at EOB Postural control: Posterior lean Standing  balance support: Bilateral upper extremity supported, During functional activity, Reliant on assistive device for balance Standing balance-Leahy Scale: Poor Standing balance comment: hand held assist                            Cognition Arousal/Alertness: Awake/alert Behavior During Therapy: WFL for tasks  assessed/performed Overall Cognitive Status: Within Functional Limits for tasks assessed                                 General Comments: appears slightly confused, but cooperative with much encouragement        Exercises General Exercises - Lower Extremity Long Arc Quad: Seated, AROM, Strengthening, Both, 10 reps    General Comments        Pertinent Vitals/Pain Pain Assessment Pain Assessment: No/denies pain    Home Living                          Prior Function            PT Goals (current goals can now be found in the care plan section) Acute Rehab PT Goals Patient Stated Goal: return home PT Goal Formulation: With patient Time For Goal Achievement: 08/28/21 Potential to Achieve Goals: Good Progress towards PT goals: Progressing toward goals    Frequency    Min 3X/week      PT Plan Current plan remains appropriate    Co-evaluation              AM-PAC PT "6 Clicks" Mobility   Outcome Measure  Help needed turning from your back to your side while in a flat bed without using bedrails?: A Lot Help needed moving from lying on your back to sitting on the side of a flat bed without using bedrails?: A Lot Help needed moving to and from a bed to a chair (including a wheelchair)?: A Lot Help needed standing up from a chair using your arms (e.g., wheelchair or bedside chair)?: A Lot Help needed to walk in hospital room?: Total Help needed climbing 3-5 steps with a railing? : Total 6 Click Score: 10    End of Session   Activity Tolerance: Patient tolerated treatment well;Patient limited by fatigue Patient left: in bed;with call bell/phone within reach Nurse Communication: Mobility status PT Visit Diagnosis: Unsteadiness on feet (R26.81);Other abnormalities of gait and mobility (R26.89);Muscle weakness (generalized) (M62.81)     Time: 1535-1600 PT Time Calculation (min) (ACUTE ONLY): 25 min  Charges:  $Therapeutic Activity:  23-37 mins                     4:17 PM, 08/21/21 Lonell Grandchild, MPT Physical Therapist with [facility] [phone] office [phone] mobile phone

## 2021-08-22 LAB — BASIC METABOLIC PANEL
Anion gap: 9 (ref 5–15)
BUN: 8 mg/dL (ref 8–23)
CO2: 16 mmol/L — ABNORMAL LOW (ref 22–32)
Calcium: 7.7 mg/dL — ABNORMAL LOW (ref 8.9–10.3)
Chloride: 110 mmol/L (ref 98–111)
Creatinine, Ser: 1.01 mg/dL — ABNORMAL HIGH (ref 0.44–1.00)
GFR, Estimated: >60 mL/min (ref >60)
Glucose, Bld: 93 mg/dL (ref 70–99)
Potassium: 3.3 mmol/L — ABNORMAL LOW (ref 3.5–5.1)
Sodium: 135 mmol/L (ref 135–145)

## 2021-08-22 LAB — TYPE AND SCREEN
ABO/RH(D): O POS
Antibody Screen: NEGATIVE
Unit division: 0

## 2021-08-22 LAB — CBC
HCT: 26.4 % — ABNORMAL LOW (ref 36.0–46.0)
Hemoglobin: 9.3 g/dL — ABNORMAL LOW (ref 12.0–15.0)
MCH: 33.3 pg (ref 26.0–34.0)
MCHC: 35.2 g/dL (ref 30.0–36.0)
MCV: 94.6 fL (ref 80.0–100.0)
Platelets: 204 10*3/uL (ref 150–400)
RBC: 2.79 MIL/uL — ABNORMAL LOW (ref 3.87–5.11)
RDW: 17.7 % — ABNORMAL HIGH (ref 11.5–15.5)
WBC: 11.7 10*3/uL — ABNORMAL HIGH (ref 4.0–10.5)
nRBC: 0 % (ref 0.0–0.2)

## 2021-08-22 LAB — PHOSPHORUS: Phosphorus: 2.2 mg/dL — ABNORMAL LOW (ref 2.5–4.6)

## 2021-08-22 LAB — BPAM RBC
Blood Product Expiration Date: 202302222359
ISSUE DATE / TIME: 202301201129
Unit Type and Rh: 5100

## 2021-08-22 LAB — MAGNESIUM: Magnesium: 1.6 mg/dL — ABNORMAL LOW (ref 1.7–2.4)

## 2021-08-22 MED ORDER — MAGNESIUM SULFATE 2 GM/50ML IV SOLN
2.0000 g | Freq: Once | INTRAVENOUS | Status: AC
Start: 2021-08-22 — End: 2021-08-22
  Administered 2021-08-22: 2 g via INTRAVENOUS
  Filled 2021-08-22: qty 50

## 2021-08-22 MED ORDER — SODIUM BICARBONATE 650 MG PO TABS
650.0000 mg | ORAL_TABLET | Freq: Three times a day (TID) | ORAL | Status: DC
  Administered 2021-08-22 – 2021-08-23 (×4): 650 mg via ORAL
  Filled 2021-08-22 (×4): qty 1

## 2021-08-22 MED ORDER — POTASSIUM CHLORIDE 20 MEQ PO PACK
40.0000 meq | PACK | Freq: Once | ORAL | Status: AC
  Administered 2021-08-22: 40 meq via ORAL
  Filled 2021-08-22: qty 2

## 2021-08-22 NOTE — TOC Progression Note (Signed)
Transition of Care [facility]) - Progression Note    Patient Details  Name: Colleen Lee MRN: [id number] Date of Birth: 04-01-1956  Transition of Care [facility]) CM/SW Contact  [address] [place], [place] Phone Number: 08/22/2021, 5:55 PM  Clinical Narrative:    Phone call from [place] at [place]. [name] authorization received-1(/21/23-01/30/2023) Patient can discharge tomorrow if stable, discharge summary would need to be in by noon.   [address], LCSW Transition of Care [phone]  Expected Discharge Plan: [place] Barriers to Discharge: Continued Medical Work up  Expected Discharge Plan and Services Expected Discharge Plan: [place] In-house Referral: Clinical Social Work   Post Acute Care Choice: [place] Living arrangements for the past 2 months: Single Family Home                                       Social Determinants of Health (SDOH) Interventions    Readmission Risk Interventions Readmission Risk Prevention Plan 08/14/2021 07/16/2021 05/19/2021  Transportation Screening Complete Complete Complete  HRI or Home Care Consult - - Complete  Social Work Consult for [place] Planning/Counseling - - Complete  Palliative Care Screening - - Not Applicable  Medication Review Press photographer) Complete Complete Complete  HRI or Home Care Consult Complete Complete -  SW Recovery Care/Counseling Consult Complete Complete -  Palliative Care Screening - Not Applicable -  [place] Complete Complete -  Some recent data might be hidden

## 2021-08-22 NOTE — Progress Notes (Signed)
PROGRESS NOTE    Colleen Lee  WUJ:[id number] DOB: 11/01/1955 DOA: 08/13/2021 PCP: Salli Real, MD   Brief Narrative: This 66 years old female with PMH significant for alcoholic cirrhosis, [name], asthma presented in the ED by EMS for acute metabolic encephalopathy, She was seen recently by her neurologist who thought her weakness was secondary to chronic liver disease secondary to alcohol abuse, failure to thrive and thiamine deficiency.  CT head showed no acute intracranial abnormality. In the setting of alcoholic cirrhosis,  ammonia level was negative.  CT head showed advanced cerebral atrophy with chronic small vessel ischemic changes.She was started on IV fluids, folate and IV thiamine.  Patient was admitted for acute metabolic encephalopathy , stroke work-up is complete,  showed small new CVA.  Neurology is consulted, PT recommended skilled nursing facility.  Assessment & Plan:   Principal Problem:   Acute metabolic encephalopathy Active Problems:   Leukocytosis   Macrocytic anemia   Hyperkalemia   Failure to thrive (child)   Hypomagnesemia   Hypophosphatemia   Elevated troponin   Failure to thrive in adult   Encephalopathy   Protein-calorie malnutrition, severe   Pressure injury of skin   Acute metabolic encephalopathy possibly secondary to new CVA: Patient presented with confusion, lethargy, not following commands. MRI showed possible stroke.  Neurology was consulted. 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 EEG no evidence of seizures.  Findings consistent with generalized slowing. Echocardiogram showed LVEF 65 to 70%.  Hemoglobin A1c 4.1.  LDL 49. Continue aspirin.  Continue aggressive risk factor management. Neurology signed off,  recommended outpatient follow-up with [facility] in 4 weeks.  Generalized weakness: Could be secondary to CVA, failure to  thrive. PT evaluation recommended short-term rehab. Family has agreed to send the patient to short-term rehab  Hyperkalemia: Resolved with IV hydration.  Hypokalemia: Replaced.  Continue to monitor.  Hypomagnesemia: Likely secondary to alcohol abuse. Replaced. Continue to monitor.  Hyperphosphatemia: Improving, recheck a.m. labs  Macrocytic anemia: Likely due to alcohol abuse.  B12 is more than 1000. Hb dropped from 7.6> 6.6, no signs of any visible bleeding. Transfuse 1 unit PRBC.  post transf Hb.9.8  Essential hypertension: Continue metoprolol.  Leucocytosis:  Patient remained afebrile. Improving  DVT prophylaxis: Lovenox. Code Status: Full code. Family Communication:Brother in the room. Disposition Plan:   Status is: Inpatient  Remains inpatient appropriate because:  Admitted for metabolic encephalopathy found to have new stroke,  Neurology is consulted,  work-up is complete.   Insurance authorization expired for SNF placement.  New insurance authorization submitted.  Possible discharge to SNF on Monday  Consultants:   Neurology  Procedures: CT head, MRI, Echo  Antimicrobials:   Anti-infectives (From admission, onward)    None       Subjective: Patient was seen and examined at bedside.  Overnight events noted.   Patient is lying in the bed comfortably, appears chronically ill, weak and tired. She reports feeling better,  still has a lot of weakness, denies any chest pain or shortness of breath.   Objective: Vitals:   08/21/21 2130 08/22/21 0351 08/22/21 0500 08/22/21 0814  BP: 103/66 132/85  100/71  Pulse: (!) 119 (!) 107  (!) 108  Resp:  18    Temp:  98.9 F (37.2 C)    TempSrc:  Oral    SpO2: 97% 96%    Weight:   60.8 kg   Height:  Intake/Output Summary (Last 24 hours) at 08/22/2021 1118 Last data filed at 08/22/2021 0900 Gross per 24 hour  Intake 1158 ml  Output 2300 ml  Net -1142 ml   Filed Weights   08/14/21 0510 08/15/21 0555  08/22/21 0500  Weight: 55.5 kg 57.6 kg 60.8 kg    Examination:  General exam: Appears deconditioned, chronically ill looking, comfortable, not in any distress. Respiratory system: Clear to auscultation bilaterally, respiratory effort normal, RR 14 Cardiovascular system: S1 & S2 heard, regular rate and rhythm, no murmur.   Gastrointestinal system: Abdomen is soft, nontender,  mildly distended, BS+ Central nervous system: Alert and oriented x 2. No focal neurological deficits. Extremities: No edema, no cyanosis, no clubbing. Skin: No rashes, lesions or ulcers Psychiatry:. Mood & affect appropriate.     Data Reviewed: I have personally reviewed following labs and imaging studies  CBC: Recent Labs  Lab 08/17/21 0457 08/20/21 0538 08/20/21 1310 08/21/21 0451 08/21/21 1900 08/22/21 0515  WBC 10.7* 10.8*  --  9.7  --  11.7*  HGB 8.2* 7.6* 7.2* 6.6* 9.8* 9.3*  HCT 26.5* 22.3* 21.5* 20.2* 29.3* 26.4*  MCV 109.1* 99.1  --  98.5  --  94.6  PLT 208 241  --  222  --  204   Basic Metabolic Panel: Recent Labs  Lab 08/17/21 0457 08/19/21 0601 08/20/21 0538 08/20/21 1310 08/21/21 0451 08/22/21 0515  NA 135  --  139 137 138 135  K 4.5  --  2.0* 3.1* 3.8 3.3*  CL 112*  --  106 108 111 110  CO2 18*  --  19* 17* 18* 16*  GLUCOSE 95  --  112* 170* 82 93  BUN <5*  --  8 9 9 8   CREATININE 0.59  --  1.43* 1.38* 1.18* 1.01*  CALCIUM 9.2  --  7.0* 6.8* 7.2* 7.7*  MG  --   --  1.1*  --  1.6* 1.6*  PHOS 2.2* 6.4* 6.1*  --  2.4* 2.2*   GFR: Estimated Creatinine Clearance: 51.3 mL/min (A) (by C-G formula based on SCr of 1.01 mg/dL (H)). Liver Function Tests: No results for input(s): AST, ALT, ALKPHOS, BILITOT, PROT, ALBUMIN in the last 168 hours.  No results for input(s): LIPASE, AMYLASE in the last 168 hours. No results for input(s): AMMONIA in the last 168 hours.  Coagulation Profile: No results for input(s): INR, PROTIME in the last 168 hours.  Cardiac Enzymes: No results for  input(s): CKTOTAL, CKMB, CKMBINDEX, TROPONINI in the last 168 hours. BNP (last 3 results) No results for input(s): PROBNP in the last 8760 hours. HbA1C: No results for input(s): HGBA1C in the last 72 hours.  CBG: No results for input(s): GLUCAP in the last 168 hours. Lipid Profile: No results for input(s): CHOL, HDL, LDLCALC, TRIG, CHOLHDL, LDLDIRECT in the last 72 hours.  Thyroid Function Tests: No results for input(s): TSH, T4TOTAL, FREET4, T3FREE, THYROIDAB in the last 72 hours. Anemia Panel: No results for input(s): VITAMINB12, FOLATE, FERRITIN, TIBC, IRON, RETICCTPCT in the last 72 hours. Sepsis Labs: No results for input(s): PROCALCITON, LATICACIDVEN in the last 168 hours.  Recent Results (from the past 240 hour(s))  Resp Panel by RT-PCR (Flu A&B, Covid) Nasopharyngeal Swab     Status: None   Collection Time: 08/13/21  9:54 PM   Specimen: Nasopharyngeal Swab; Nasopharyngeal(NP) swabs in vial transport medium  Result Value Ref Range Status   SARS Coronavirus 2 by RT PCR NEGATIVE NEGATIVE Final    Comment: (NOTE)  SARS-CoV-2 target nucleic acids are NOT DETECTED.  The SARS-CoV-2 RNA is generally detectable in upper respiratory specimens during the acute phase of infection. The lowest concentration of SARS-CoV-2 viral copies this assay can detect is 138 copies/mL. A negative result does not preclude SARS-Cov-2 infection and should not be used as the sole basis for treatment or other patient management decisions. A negative result may occur with  improper specimen collection/handling, submission of specimen other than nasopharyngeal swab, presence of viral mutation(s) within the areas targeted by this assay, and inadequate number of viral copies(<138 copies/mL). A negative result must be combined with clinical observations, patient history, and epidemiological information. The expected result is Negative.  Fact Sheet for Patients:   BloggerCourse.com  Fact Sheet for Healthcare Providers:  SeriousBroker.it  This test is no t yet approved or cleared by the [place] FDA and  has been authorized for detection and/or diagnosis of SARS-CoV-2 by FDA under an Emergency Use Authorization (EUA). This EUA will remain  in effect (meaning this test can be used) for the duration of the COVID-19 declaration under Section 564(b)(1) of the Act, 21 U.S.C.section 360bbb-3(b)(1), unless the authorization is terminated  or revoked sooner.       Influenza A by PCR NEGATIVE NEGATIVE Final   Influenza B by PCR NEGATIVE NEGATIVE Final    Comment: (NOTE) The Xpert Xpress SARS-CoV-2/FLU/RSV plus assay is intended as an aid in the diagnosis of influenza from Nasopharyngeal swab specimens and should not be used as a sole basis for treatment. Nasal washings and aspirates are unacceptable for Xpert Xpress SARS-CoV-2/FLU/RSV testing.  Fact Sheet for Patients: BloggerCourse.com  Fact Sheet for Healthcare Providers: SeriousBroker.it  This test is not yet approved or cleared by the [place] FDA and has been authorized for detection and/or diagnosis of SARS-CoV-2 by FDA under an Emergency Use Authorization (EUA). This EUA will remain in effect (meaning this test can be used) for the duration of the COVID-19 declaration under Section 564(b)(1) of the Act, 21 U.S.C. section 360bbb-3(b)(1), unless the authorization is terminated or revoked.  Performed at [facility], [address] [place], [place] [address]     Radiology Studies: No results found.  Scheduled Meds:  amLODipine  5 mg Oral Daily   aspirin EC  81 mg Oral Daily   Chlorhexidine Gluconate Cloth  6 each Topical Daily   feeding supplement  237 mL Oral TID BM   folic acid  1 mg Oral Daily   heparin injection (subcutaneous)  5,000 Units Subcutaneous Q8H   lactulose   10 g Oral BID   metoprolol tartrate  50 mg Oral BID   multivitamin with minerals  1 tablet Oral Daily   pantoprazole  40 mg Oral BID   sodium bicarbonate  650 mg Oral TID   sodium chloride flush  10-40 mL Intracatheter Q12H   thiamine  100 mg Oral Daily   Continuous Infusions:  dextrose 5 % and 0.45% NaCl Stopped (08/20/21 1003)   magnesium sulfate bolus IVPB 2 g (08/22/21 1108)     LOS: 8 days    Time spent: 35 mins    Jazia Faraci, MD [facility]   If 7PM-7AM, please contact night-coverage

## 2021-08-22 NOTE — TOC Progression Note (Signed)
Transition of Care [facility]) - Progression Note    Patient Details  Name: Colleen Lee MRN: [id number] Date of Birth: 18-Nov-1955  Transition of Care [facility]) CM/SW Contact  Deshannon Seide, [place], [place] Phone Number: 08/22/2021, 12:23 PM  Clinical Narrative:    Phone call to Kenvir at [place] to follow up on insurance authorization for patient to discharge to SNF. Jackelyn Poling will check and call this social worker back.  [address] LCSW Transition of Care [phone]    Expected Discharge Plan: [place] Barriers to Discharge: Continued Medical Work up  Expected Discharge Plan and Services Expected Discharge Plan: [place] In-house Referral: Clinical Social Work   Post Acute Care Choice: [place] Living arrangements for the past 2 months: Single Family Home                                       Social Determinants of Health (SDOH) Interventions    Readmission Risk Interventions Readmission Risk Prevention Plan 08/14/2021 07/16/2021 05/19/2021  Transportation Screening Complete Complete Complete  HRI or Home Care Consult - - Complete  Social Work Consult for [place] Planning/Counseling - - Complete  Palliative Care Screening - - Not Applicable  Medication Review Press photographer) Complete Complete Complete  HRI or Home Care Consult Complete Complete -  SW Recovery Care/Counseling Consult Complete Complete -  Palliative Care Screening - Not Applicable -  [place] Complete Complete -  Some recent data might be hidden

## 2021-08-23 DIAGNOSIS — R0902 Hypoxemia: Secondary | ICD-10-CM | POA: Diagnosis not present

## 2021-08-23 DIAGNOSIS — F1011 Alcohol abuse, in remission: Secondary | ICD-10-CM | POA: Diagnosis not present

## 2021-08-23 DIAGNOSIS — J181 Lobar pneumonia, unspecified organism: Secondary | ICD-10-CM | POA: Diagnosis present

## 2021-08-23 DIAGNOSIS — R000 Tachycardia, unspecified: Secondary | ICD-10-CM | POA: Diagnosis not present

## 2021-08-23 DIAGNOSIS — Z515 Encounter for palliative care: Secondary | ICD-10-CM | POA: Diagnosis not present

## 2021-08-23 DIAGNOSIS — R279 Unspecified lack of coordination: Secondary | ICD-10-CM | POA: Diagnosis not present

## 2021-08-23 DIAGNOSIS — M797 Fibromyalgia: Secondary | ICD-10-CM | POA: Diagnosis not present

## 2021-08-23 DIAGNOSIS — L89626 Pressure-induced deep tissue damage of left heel: Secondary | ICD-10-CM | POA: Diagnosis not present

## 2021-08-23 DIAGNOSIS — I639 Cerebral infarction, unspecified: Secondary | ICD-10-CM | POA: Diagnosis not present

## 2021-08-23 DIAGNOSIS — E8779 Other fluid overload: Secondary | ICD-10-CM | POA: Diagnosis not present

## 2021-08-23 DIAGNOSIS — Z7401 Bed confinement status: Secondary | ICD-10-CM | POA: Diagnosis not present

## 2021-08-23 DIAGNOSIS — Z743 Need for continuous supervision: Secondary | ICD-10-CM | POA: Diagnosis not present

## 2021-08-23 DIAGNOSIS — I471 Supraventricular tachycardia: Secondary | ICD-10-CM | POA: Diagnosis not present

## 2021-08-23 DIAGNOSIS — D638 Anemia in other chronic diseases classified elsewhere: Secondary | ICD-10-CM | POA: Diagnosis not present

## 2021-08-23 DIAGNOSIS — R509 Fever, unspecified: Secondary | ICD-10-CM | POA: Diagnosis not present

## 2021-08-23 DIAGNOSIS — G9340 Encephalopathy, unspecified: Secondary | ICD-10-CM | POA: Diagnosis not present

## 2021-08-23 DIAGNOSIS — E8809 Other disorders of plasma-protein metabolism, not elsewhere classified: Secondary | ICD-10-CM | POA: Diagnosis not present

## 2021-08-23 DIAGNOSIS — Z66 Do not resuscitate: Secondary | ICD-10-CM | POA: Diagnosis not present

## 2021-08-23 DIAGNOSIS — J189 Pneumonia, unspecified organism: Secondary | ICD-10-CM | POA: Diagnosis not present

## 2021-08-23 DIAGNOSIS — E519 Thiamine deficiency, unspecified: Secondary | ICD-10-CM | POA: Diagnosis not present

## 2021-08-23 DIAGNOSIS — F1010 Alcohol abuse, uncomplicated: Secondary | ICD-10-CM | POA: Diagnosis present

## 2021-08-23 DIAGNOSIS — R5381 Other malaise: Secondary | ICD-10-CM | POA: Diagnosis not present

## 2021-08-23 DIAGNOSIS — R404 Transient alteration of awareness: Secondary | ICD-10-CM | POA: Diagnosis not present

## 2021-08-23 DIAGNOSIS — M6281 Muscle weakness (generalized): Secondary | ICD-10-CM | POA: Diagnosis not present

## 2021-08-23 DIAGNOSIS — J45909 Unspecified asthma, uncomplicated: Secondary | ICD-10-CM | POA: Diagnosis not present

## 2021-08-23 DIAGNOSIS — R0689 Other abnormalities of breathing: Secondary | ICD-10-CM | POA: Diagnosis not present

## 2021-08-23 DIAGNOSIS — I10 Essential (primary) hypertension: Secondary | ICD-10-CM | POA: Diagnosis not present

## 2021-08-23 DIAGNOSIS — N390 Urinary tract infection, site not specified: Secondary | ICD-10-CM | POA: Diagnosis not present

## 2021-08-23 DIAGNOSIS — L89154 Pressure ulcer of sacral region, stage 4: Secondary | ICD-10-CM | POA: Diagnosis not present

## 2021-08-23 DIAGNOSIS — G9341 Metabolic encephalopathy: Secondary | ICD-10-CM | POA: Diagnosis not present

## 2021-08-23 DIAGNOSIS — E43 Unspecified severe protein-calorie malnutrition: Secondary | ICD-10-CM | POA: Diagnosis not present

## 2021-08-23 DIAGNOSIS — R7881 Bacteremia: Secondary | ICD-10-CM | POA: Diagnosis not present

## 2021-08-23 DIAGNOSIS — R627 Adult failure to thrive: Secondary | ICD-10-CM | POA: Diagnosis not present

## 2021-08-23 DIAGNOSIS — K7030 Alcoholic cirrhosis of liver without ascites: Secondary | ICD-10-CM | POA: Diagnosis present

## 2021-08-23 DIAGNOSIS — J9621 Acute and chronic respiratory failure with hypoxia: Secondary | ICD-10-CM | POA: Diagnosis not present

## 2021-08-23 DIAGNOSIS — I2693 Single subsegmental pulmonary embolism without acute cor pulmonale: Secondary | ICD-10-CM | POA: Diagnosis not present

## 2021-08-23 DIAGNOSIS — A419 Sepsis, unspecified organism: Secondary | ICD-10-CM | POA: Diagnosis not present

## 2021-08-23 DIAGNOSIS — J690 Pneumonitis due to inhalation of food and vomit: Secondary | ICD-10-CM | POA: Diagnosis not present

## 2021-08-23 DIAGNOSIS — Z20822 Contact with and (suspected) exposure to covid-19: Secondary | ICD-10-CM | POA: Diagnosis not present

## 2021-08-23 DIAGNOSIS — A4159 Other Gram-negative sepsis: Secondary | ICD-10-CM | POA: Diagnosis not present

## 2021-08-23 DIAGNOSIS — K7460 Unspecified cirrhosis of liver: Secondary | ICD-10-CM | POA: Diagnosis not present

## 2021-08-23 DIAGNOSIS — Y95 Nosocomial condition: Secondary | ICD-10-CM | POA: Diagnosis present

## 2021-08-23 DIAGNOSIS — R6520 Severe sepsis without septic shock: Secondary | ICD-10-CM | POA: Diagnosis not present

## 2021-08-23 DIAGNOSIS — B182 Chronic viral hepatitis C: Secondary | ICD-10-CM | POA: Diagnosis present

## 2021-08-23 DIAGNOSIS — R410 Disorientation, unspecified: Secondary | ICD-10-CM | POA: Diagnosis not present

## 2021-08-23 DIAGNOSIS — I499 Cardiac arrhythmia, unspecified: Secondary | ICD-10-CM | POA: Diagnosis not present

## 2021-08-23 LAB — CBC
HCT: 28.9 % — ABNORMAL LOW (ref 36.0–46.0)
Hemoglobin: 9.9 g/dL — ABNORMAL LOW (ref 12.0–15.0)
MCH: 32.7 pg (ref 26.0–34.0)
MCHC: 34.3 g/dL (ref 30.0–36.0)
MCV: 95.4 fL (ref 80.0–100.0)
Platelets: 244 10*3/uL (ref 150–400)
RBC: 3.03 MIL/uL — ABNORMAL LOW (ref 3.87–5.11)
RDW: 17.9 % — ABNORMAL HIGH (ref 11.5–15.5)
WBC: 13.1 10*3/uL — ABNORMAL HIGH (ref 4.0–10.5)
nRBC: 0 % (ref 0.0–0.2)

## 2021-08-23 LAB — MAGNESIUM: Magnesium: 1.9 mg/dL (ref 1.7–2.4)

## 2021-08-23 LAB — BASIC METABOLIC PANEL
Anion gap: 9 (ref 5–15)
BUN: 10 mg/dL (ref 8–23)
CO2: 18 mmol/L — ABNORMAL LOW (ref 22–32)
Calcium: 8.8 mg/dL — ABNORMAL LOW (ref 8.9–10.3)
Chloride: 111 mmol/L (ref 98–111)
Creatinine, Ser: 0.85 mg/dL (ref 0.44–1.00)
GFR, Estimated: >60 mL/min (ref >60)
Glucose, Bld: 92 mg/dL (ref 70–99)
Potassium: 3.9 mmol/L (ref 3.5–5.1)
Sodium: 138 mmol/L (ref 135–145)

## 2021-08-23 LAB — PHOSPHORUS: Phosphorus: 2.4 mg/dL — ABNORMAL LOW (ref 2.5–4.6)

## 2021-08-23 MED ORDER — SODIUM BICARBONATE 650 MG PO TABS: 650.0000 mg | ORAL_TABLET | Freq: Three times a day (TID) | ORAL | 0 refills | Status: AC

## 2021-08-23 MED ORDER — ASPIRIN 81 MG PO TBEC: 81.0000 mg | DELAYED_RELEASE_TABLET | Freq: Every day | ORAL | 11 refills | Status: AC

## 2021-08-23 MED ORDER — METOPROLOL TARTRATE 50 MG PO TABS: 50.0000 mg | ORAL_TABLET | Freq: Two times a day (BID) | ORAL | 1 refills | Status: AC

## 2021-08-23 MED ORDER — AMLODIPINE BESYLATE 5 MG PO TABS: 5.0000 mg | ORAL_TABLET | Freq: Every day | ORAL | 1 refills | Status: AC

## 2021-08-23 NOTE — Discharge Summary (Signed)
Physician Discharge Summary  TIYANNA LARCOM MEQ:[id number] DOB: [birth year] DOA: 08/13/2021  PCP: Sandi Mariscal, MD  Admit date: 08/13/2021  Discharge date: 08/23/2021  Admitted From: Home.  Disposition: [facility] skilled nursing facility  Recommendations for Outpatient Follow-up:  Follow up with PCP in 1-2 weeks. Please obtain BMP/CBC in one week. Advised to continue aspirin regularly for CVA. Advised to take Lopressor 50 mg twice daily for hypertension  Home Health: None Equipment/Devices: None  Discharge Condition: Stable CODE STATUS:Full code Diet recommendation: Heart Healthy  Brief [facility] Course: This 66 years old female with PMH significant for alcoholic cirrhosis, [name], asthma presented in the ED by EMS for acute metabolic encephalopathy, She was seen recently by her neurologist who thought her weakness was secondary to chronic liver disease secondary to alcohol abuse, failure to thrive and thiamine deficiency. CT head showed no acute intracranial abnormality, Ammonia level was negative.  CT head showed advanced cerebral atrophy with chronic small vessel ischemic changes. She was started on IV fluids, folate and IV thiamine.   Patient was admitted for acute metabolic encephalopathy , stroke work-up has initiated,  showed small new CVA.  Neurology was consulted, EEG no seizures but shows generalized slowing.  Metabolic encephalopathy has resolved.  Neurology stated stroke is very small to explain altered mentation.  MRI showed right cerebellum small acute infarct in right frontal MCA/ACA punctate subacute infarct.  Echo: LVEF shows 65 to 70%.  LDL under goal.  Hemoglobin A1c 4.1.  Neurologist recommended aspirin 81 mg per subacute stroke.  Neurology signed off, advised outpatient follow-up with Dr. Leta Baptist in 4 weeks.  PT recommended skilled nursing facility.  [name] electrolytes has been replaced.  Patient was given 1 unit of packed red blood cells,  hemoglobin  has stabilized afterwards.  Patient feels better and patient is being discharged to skilled nursing facility for rehab.  She was managed for below problems.  Discharge Diagnoses:  Principal Problem:   Acute metabolic encephalopathy Active Problems:   Leukocytosis   Macrocytic anemia   Hyperkalemia   Failure to thrive (child)   Hypomagnesemia   Hypophosphatemia   Elevated troponin   Failure to thrive in adult   Encephalopathy   Protein-calorie malnutrition, severe   Pressure injury of skin  Acute metabolic encephalopathy possibly secondary to new CVA: Patient presented with confusion, lethargy, not following commands. MRI showed possible stroke.  Neurology was consulted. 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 EEG no evidence of seizures.  Findings consistent with generalized slowing. Echocardiogram showed LVEF 65 to 70%.  Hemoglobin A1c 4.1.  LDL 49. Continue aspirin.  Continue aggressive risk factor management. Neurology signed off,  recommended outpatient follow-up with [facility] in 4 weeks.   Generalized weakness: Could be secondary to CVA, failure to thrive. PT evaluation recommended short-term rehab. Family has agreed to send the patient to short-term rehab   Hyperkalemia: Resolved with IV hydration.   Hypokalemia: Replaced.  Resolved.   Hypomagnesemia: Likely secondary to alcohol abuse. Replaced. Resolved.   Hyperphosphatemia: Improving,   Macrocytic anemia: Likely due to alcohol abuse.  B12 is more than 1000. Hb dropped from 7.6> 6.6, no signs of any visible bleeding. Transfuse 1 unit PRBC.  post transf Hb.9.8   Essential hypertension: Continue metoprolol.   Leucocytosis:  Patient remained afebrile. Improving  Discharge Instructions  Discharge Instructions     Ambulatory referral to Neurology   Complete by: As  directed    Follow up with  Dr. Marjory Lies at [facility] in 4-6 weeks. Pt is Dr. Richrd Humbles pt. Thanks.   Call MD for:  difficulty breathing, headache or visual disturbances   Complete by: As directed    Call MD for:  persistant dizziness or light-headedness   Complete by: As directed    Call MD for:  persistant nausea and vomiting   Complete by: As directed    Diet - low sodium heart healthy   Complete by: As directed    Diet Carb Modified   Complete by: As directed    Discharge instructions   Complete by: As directed    Advised to follow-up with primary care physician in 1 week. Advised to continue aspirin regularly for CVA. Advised to take Lopressor 50 mg twice daily for hypertension   Discharge wound care:   Complete by: As directed    Follow-up wound care at nursing home   Increase activity slowly   Complete by: As directed       Allergies as of 08/23/2021       Reactions   Penicillins Rash   Did it involve swelling of the face/tongue/throat, SOB, or low BP? No Did it involve sudden or severe rash/hives, skin peeling, or any reaction on the inside of your mouth or nose? No Did you need to seek medical attention at a hospital or doctor's office? No When did it last happen? Many years ago      If all above answers are NO, may proceed with cephalosporin use.        Medication List     STOP taking these medications    atenolol 25 MG tablet Commonly known as: Tenormin   sucralfate 1 g tablet Commonly known as: Carafate   thiamine 100 MG tablet       TAKE these medications    albuterol 108 (90 Base) MCG/ACT inhaler Commonly known as: VENTOLIN HFA Inhale 2 puffs into the lungs every 6 (six) hours as needed for wheezing or shortness of breath.   allopurinol 300 MG tablet Commonly known as: ZYLOPRIM Take 300 mg by mouth daily.   amLODipine 5 MG tablet Commonly known as: NORVASC Take 1 tablet (5 mg total) by mouth daily.   aspirin 81 MG EC tablet Take 1 tablet (81 mg total) by mouth  daily. Swallow whole.   cyclobenzaprine 5 MG tablet Commonly known as: FLEXERIL Take 5 mg by mouth 3 (three) times daily.   Dialyvite Vitamin D3 Max 1.25 MG (50000 UT) Tabs Generic drug: Cholecalciferol Take 1 tablet by mouth every 7 (seven) days.   folic acid 1 MG tablet Commonly known as: FOLVITE Take 1 tablet (1 mg total) by mouth daily.   gabapentin 100 MG capsule Commonly known as: NEURONTIN Take 1 capsule (100 mg total) by mouth 3 (three) times daily. What changed: when to take this   HYDROcodone-acetaminophen 5-325 MG tablet Commonly known as: NORCO/VICODIN Take 1 tablet by mouth every 6 (six) hours as needed. What changed: reasons to take this   hydrOXYzine 10 MG tablet Commonly known as: ATARAX Take 1 tablet (10 mg total) by mouth daily as needed. What changed: reasons to take this   lactulose 10 GM/15ML solution Commonly known as: CHRONULAC Take 30 mLs (20 g total) by mouth 2 (two) times daily as needed for mild constipation or moderate constipation.   megestrol 40 MG tablet Commonly known as: MEGACE Take 1 tablet (40 mg total) by mouth daily.  metoprolol tartrate 50 MG tablet Commonly known as: LOPRESSOR Take 1 tablet (50 mg total) by mouth 2 (two) times daily.   naloxone 4 MG/0.1ML Liqd nasal spray kit Commonly known as: NARCAN See admin instructions. ADMINISTER A SINGLE SPRAYEOF NARCAN INTO ONECNOSTRIL. REPEAT AFTER 3SMINUTES IN OTHER NOSTRIL IF NO RESPONSE.   ondansetron 4 MG tablet Commonly known as: ZOFRAN Take 1 tablet (4 mg total) by mouth every 6 (six) hours as needed for nausea.   pantoprazole 40 MG tablet Commonly known as: PROTONIX Take 1 tablet (40 mg total) by mouth 2 (two) times daily.   potassium chloride 10 MEQ CR capsule Commonly known as: MICRO-K Take 20 mEq by mouth daily.   sodium bicarbonate 650 MG tablet Take 1 tablet (650 mg total) by mouth 3 (three) times daily.   Trulance 3 MG Tabs Generic drug: Plecanatide Take 3 mg  by mouth daily.               Durable Medical Equipment  (From admission, onward)           Start     Ordered   08/17/21 1059  For home use only DME Other see comment  Once       Comments: Harrel Lemon lift  Question:  Length of Need  Answer:  Lifetime   08/17/21 1059   08/17/21 1053  For home use only DME Hospital bed  Once       Question Answer Comment  Length of Need Lifetime   The above medical condition requires: Patient requires the ability to reposition frequently   Bed type Semi-electric      08/17/21 1055   08/17/21 1053  For home use only DME lightweight manual wheelchair with seat cushion  Once       Comments: Patient suffers from weakness which impairs their ability to perform daily activities like bathing, dressing, grooming, and toileting in the home.  A walker will not resolve  issue with performing activities of daily living. A wheelchair will allow patient to safely perform daily activities. Patient is not able to propel themselves in the home using a standard weight wheelchair due to general weakness. Patient can self propel in the lightweight wheelchair. Length of need Lifetime. Accessories: elevating leg rests (ELRs), wheel locks, extensions and anti-tippers.   08/17/21 1055              Discharge Care Instructions  (From admission, onward)           Start     Ordered   08/23/21 0000  Discharge wound care:       Comments: Follow-up wound care at nursing home   08/23/21 1014            Contact information for follow-up providers     Penumalli, Earlean Polka, MD. Schedule an appointment as soon as possible for a visit in 1 month(s).   Specialties: Neurology, Radiology Contact information: [address] [place] [address] [phone]         Sandi Mariscal, MD Follow up in 1 week(s).   Specialty: Internal Medicine Contact information: [place] [address] [phone]              Contact  information for after-discharge care     [facility] .   Service: Skilled Chiropodist information: [address] [place] [phone]  Allergies  Allergen Reactions   Penicillins Rash    Did it involve swelling of the face/tongue/throat, SOB, or low BP? No Did it involve sudden or severe rash/hives, skin peeling, or any reaction on the inside of your mouth or nose? No Did you need to seek medical attention at a hospital or doctor's office? No When did it last happen? Many years ago      If all above answers are NO, may proceed with cephalosporin use.      Consultations: Neurology   Procedures/Studies: CT ANGIO HEAD NECK W WO CM  Result Date: 08/17/2021 CLINICAL DATA:  Stroke/TIA, determine embolic source. Right frontoparietal and cerebellar infarcts as well as abnormal appearance of the right vertebral artery on MRI. EXAM: CT ANGIOGRAPHY HEAD AND NECK TECHNIQUE: Multidetector CT imaging of the head and neck was performed using the standard protocol during bolus administration of intravenous contrast. Multiplanar CT image reconstructions and MIPs were obtained to evaluate the vascular anatomy. Carotid stenosis measurements (when applicable) are obtained utilizing NASCET criteria, using the distal internal carotid diameter as the denominator. RADIATION DOSE REDUCTION: This exam was performed according to the departmental dose-optimization program which includes automated exposure control, adjustment of the mA and/or kV according to patient size and/or use of iterative reconstruction technique. CONTRAST:  134mL OMNIPAQUE IOHEXOL 350 MG/ML SOLN COMPARISON:  Head MRI 08/17/2021 FINDINGS: CT HEAD FINDINGS Brain: There is a small acute infarct superiorly in the right cerebellar hemisphere as shown on MRI. The subcentimeter right frontoparietal subcortical infarct on MRI is not well  seen by CT. No intracranial hemorrhage, mass, midline shift, or extra-axial fluid collection is identified. There is mild cerebral atrophy. Periventricular white matter hypodensities are nonspecific but compatible with mild chronic small vessel ischemic disease. Vascular: Calcified atherosclerosis at the skull base. Skull: No fracture or suspicious osseous lesion. Sinuses: Unchanged fluid in the right sphenoid sinus. Trace right mastoid fluid. Orbits: Bilateral cataract extraction. Review of the MIP images confirms the above findings CTA NECK FINDINGS Aortic arch: Normal variant aortic arch branching pattern with common origin of the brachiocephalic and left common carotid arteries. Widely patent arch vessel origins. Right carotid system: Patent with a small amount calcified plaque at the carotid bifurcation. No evidence of a significant stenosis or dissection. Left carotid system: Patent with minimal atherosclerotic plaque at the carotid bifurcation and possible tiny web laterally in the proximal aspect of the carotid bulb. No evidence of a significant stenosis or dissection. Vertebral arteries: The vertebral arteries are patent without evidence of a significant stenosis or dissection of the left vertebral artery which is strongly dominant. The right vertebral artery is diffusely small in caliber with a superimposed severe stenosis at its origin. Skeleton: Mild disc and moderate facet degeneration in the cervical spine. Other neck: No evidence of cervical lymphadenopathy or mass. Upper chest: Centrilobular emphysema. Partially visualized right PICC. Review of the MIP images confirms the above findings CTA HEAD FINDINGS Anterior circulation: Internal carotid arteries are patent from skull base to carotid termini. The cavernous carotid segments are tortuous with atherosclerotic calcification greater on the left not resulting in significant stenosis. ACAs and MCAs are patent without evidence of a proximal branch  occlusion or significant proximal stenosis. No aneurysm is identified. Posterior circulation: The intracranial vertebral arteries are patent to the basilar with mild nonstenotic calcified plaque on the left. There is moderate to severe stenosis of the right vertebral artery proximal to the PICA origin. Patent PICA and SCA origins are seen bilaterally. The basilar  artery is widely patent and tortuous. Posterior communicating arteries are diminutive or absent. Both PCAs are patent without evidence of a significant proximal stenosis. No aneurysm is identified. Venous sinuses: As permitted by contrast timing, patent. Anatomic variants: None. Review of the MIP images confirms the above findings IMPRESSION: 1. No large vessel occlusion. 2. 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 3. No significant stenosis of the strongly dominant left vertebral artery. 4. Minimal cervical carotid artery atherosclerosis without significant stenosis. 5. Emphysema (ICD10-J43.9). Electronically Signed   By: Logan Bores M.D.   On: 08/17/2021 14:53   CT Head Wo Contrast  Result Date: 08/04/2021 CLINICAL DATA:  Provided history: Neuro deficit, acute, stroke suspected. Mental status change, unknown cause. Slurred speech. EXAM: CT HEAD WITHOUT CONTRAST TECHNIQUE: Contiguous axial images were obtained from the base of the skull through the vertex without intravenous contrast. COMPARISON:  Brain MRI 07/13/2021.  Head CT 05/13/2014. FINDINGS: Brain: Mild generalized cerebral atrophy. Mild patchy and ill-defined hypoattenuation within the cerebral white matter, nonspecific but compatible with chronic small vessel ischemic disease. There is no acute intracranial hemorrhage. No demarcated cortical infarct. No extra-axial fluid collection. No evidence of an intracranial mass. No midline shift. Vascular: No hyperdense vessel.  Atherosclerotic calcifications. Skull: Normal. Negative for fracture or  focal lesion. Sinuses/Orbits: Visualized orbits show no acute finding. No significant paranasal sinus disease at the imaged levels. IMPRESSION: No evidence of acute intracranial abnormality. Mild chronic small vessel ischemic changes within the cerebral white matter. Mild generalized cerebral atrophy. Electronically Signed   By: Kellie Simmering D.O.   On: 08/04/2021 14:14   MR BRAIN WO CONTRAST  Result Date: 08/17/2021 CLINICAL DATA:  Provided history: Delirium. EXAM: MRI HEAD WITHOUT CONTRAST TECHNIQUE: Multiplanar, multiecho pulse sequences of the brain and surrounding structures were obtained without intravenous contrast. COMPARISON:  Prior head CT examinations 08/13/2021 and earlier. Brain MRI 07/13/2021. FINDINGS: Brain: Intermittently motion degraded examination, limiting evaluation. Most notably, there is severe motion degradation of the sagittal T1 weighted sequence, severe motion degradation of the axial T1 weighted sequence and severe motion degradation of the coronal T2 TSE sequence. Mild to moderate generalized cerebral atrophy. Comparatively mild cerebellar atrophy. 5 mm acute/early subacute infarct within the left frontoparietal subcortical white matter (series 5, image 25) (series 7, image 12). 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 Chronic small vessel ischemic changes which are mild in cerebral white matter, and mild to moderate in the pons. No evidence of an intracranial mass. No chronic intracranial blood products. No extra-axial fluid collection. No midline shift. Vascular: Similar to the prior brain MRI of 07/13/2021, there is signal abnormality within the intracranial right vertebral artery suggesting high-grade stenosis or vessel occlusion. Skull and upper cervical spine: Within described limitations, no focal suspicious marrow lesion is identified. Sinuses/Orbits: Visualized orbits show no acute  finding. Large fluid level, and background mild mucosal thickening, within the right sphenoid sinus. Other: Trace fluid within the bilateral mastoid air cells. IMPRESSION: Intermittently motion degraded examination, as described and limiting evaluation. 5 mm acute/early subacute infarct within the right frontoparietal subcortical white matter. 1.8 x 0.7 cm acute infarct within the superior right cerebellar hemisphere. Background chronic small vessel ischemic changes which are mild in the cerebral white matter, and mild-to-moderate in the pons, stable from the brain MRI of 07/13/2021. Similar to the prior MRI, there is signal abnormality within the intracranial right  vertebral artery suggesting high-grade stenosis or vessel occlusion. MR or CT angiography may be obtained for further evaluation, as clinically warranted. Mild-to-moderate generalized cerebral atrophy. Comparatively mild cerebellar atrophy. Right sphenoid sinusitis. Electronically Signed   By: Kellie Simmering D.O.   On: 08/17/2021 09:35   DG CHEST PORT 1 VIEW  Result Date: 08/16/2021 CLINICAL DATA:  The reason for exam is stated as dyspnea. Hx of asthma, HTN, and current smoker of 1 pack a day x 40 years. Negative covid-19 test x 3 days ago. EXAM: PORTABLE CHEST 1 VIEW COMPARISON:  Chest x-ray 07/09/2021 acidosis chest 09/27/2000 FINDINGS: Right PICC line with tip overlying the expected region of the superior cavoatrial junction. The heart and mediastinal contours are unchanged. No focal consolidation. No pulmonary edema. No pleural effusion. No pneumothorax. No acute osseous abnormality. IMPRESSION: No active disease. Electronically Signed   By: Iven Finn M.D.   On: 08/16/2021 19:46   EEG adult  Result Date: 08/17/2021 Tsosie Billing, MD     08/17/2021 10:12 PM [facility] Routine EEG Report Patient Name:   Reinaldo Berber Date of Birth:   [birth year] Identification Number:   MRN - [id number]  Date of Study:   08/17/2021 17:49:17 Duration: 23 min Indication: Encephalopathy, Technical Summary: A routine 20 channel electroencephalogram using the international 10-20 system of electrode placement was performed. Background: 5-6 Hz, Poorly formed States      Drowsy: were seen during drowsiness Abnormalities Generalized Slowing: Diffuse generalized slowing Activation Procedures Hyperventilation: Not performed Photic Stimulation: Not performed Classification: Abnormal : Diagnosis: This is abnormal EEG, the Presence of Generalized slowing is consistent with Encephalopathy Dr Tsosie Billing [facility] [phone] Case [id number]  ECHOCARDIOGRAM COMPLETE  Result Date: 08/17/2021    ECHOCARDIOGRAM REPORT   Patient Name:   SOLACE MANWARREN [facility] Date of Exam: 08/17/2021 Medical Rec #:  [id number]      Height:       66.0 in Accession #:    [id number]     Weight:       127.0 lb Date of Birth:  July 20, 1956     BSA:          1.649 m Patient Age:    66 years       BP:           122/94 mmHg Patient Gender: F              HR:           101 bpm. Exam Location:  [facility] Procedure: 2D Echo, Cardiac Doppler and Color Doppler Indications:    Stroke  History:        Patient has prior history of Echocardiogram examinations, most                 recent 07/06/2019. Risk Factors:Hypertension. ETOH Abuse.  Sonographer:    Wenda Low Referring Phys: [place]  1. Left ventricular ejection fraction, by estimation, is 65 to 70%. The left ventricle has normal function. The left ventricle has no regional wall motion abnormalities. There is mild left ventricular hypertrophy. Left ventricular diastolic parameters are consistent with Grade I diastolic dysfunction (impaired relaxation).  2. Right ventricular systolic function is normal. The right ventricular size is normal. There is mildly elevated pulmonary artery systolic pressure.  3. The mitral valve is normal in structure. No evidence of mitral valve  regurgitation. No evidence of mitral stenosis.  4. The aortic valve has an indeterminant number of cusps. Aortic valve  regurgitation is not visualized. No aortic stenosis is present. FINDINGS  Left Ventricle: Left ventricular ejection fraction, by estimation, is 65 to 70%. The left ventricle has normal function. The left ventricle has no regional wall motion abnormalities. The left ventricular internal cavity size was normal in size. There is  mild left ventricular hypertrophy. Left ventricular diastolic parameters are consistent with Grade I diastolic dysfunction (impaired relaxation). Normal left ventricular filling pressure. Right Ventricle: The right ventricular size is normal. No increase in right ventricular wall thickness. Right ventricular systolic function is normal. There is mildly elevated pulmonary artery systolic pressure. The tricuspid regurgitant velocity is 2.82  m/s, and with an assumed right atrial pressure of 8 mmHg, the estimated right ventricular systolic pressure is 78.4 mmHg. Left Atrium: Left atrial size was normal in size. Right Atrium: Right atrial size was normal in size. Pericardium: There is no evidence of pericardial effusion. Mitral Valve: The mitral valve is normal in structure. No evidence of mitral valve regurgitation. No evidence of mitral valve stenosis. MV peak gradient, 8.0 mmHg. The mean mitral valve gradient is 3.0 mmHg. Tricuspid Valve: The tricuspid valve is not well visualized. Tricuspid valve regurgitation is mild . No evidence of tricuspid stenosis. Aortic Valve: The aortic valve has an indeterminant number of cusps. Aortic valve regurgitation is not visualized. No aortic stenosis is present. Aortic valve mean gradient measures 3.0 mmHg. Aortic valve peak gradient measures 5.6 mmHg. Aortic valve area, by VTI measures 2.20 cm. Pulmonic Valve: The pulmonic valve was not well visualized. Pulmonic valve regurgitation is not visualized. No evidence of pulmonic stenosis.  Aorta: The aortic root is normal in size and structure. IAS/Shunts: No atrial level shunt detected by color flow Doppler.  LEFT VENTRICLE PLAX 2D LVIDd:         3.10 cm     Diastology LVIDs:         2.00 cm     LV e' medial:    5.66 cm/s LV PW:         1.20 cm     LV E/e' medial:  11.8 LV IVS:        1.30 cm     LV e' lateral:   6.85 cm/s LVOT diam:     1.80 cm     LV E/e' lateral: 9.8 LV SV:         47 LV SV Index:   29 LVOT Area:     2.54 cm  LV Volumes (MOD) LV vol d, MOD A2C: 20.3 ml LV vol d, MOD A4C: 14.2 ml LV vol s, MOD A2C: 8.2 ml LV vol s, MOD A4C: 5.8 ml LV SV MOD A2C:     12.1 ml LV SV MOD A4C:     14.2 ml LV SV MOD BP:      10.1 ml RIGHT VENTRICLE RV Basal diam:  3.20 cm RV Mid diam:    2.80 cm RV S prime:     17.30 cm/s LEFT ATRIUM             Index        RIGHT ATRIUM           Index LA diam:        3.10 cm 1.88 cm/m   RA Area:     11.50 cm LA Vol (A2C):   30.9 ml 18.74 ml/m  RA Volume:   26.20 ml  15.89 ml/m LA Vol (A4C):   17.0 ml 10.31 ml/m LA Biplane Vol: 25.1  ml 15.22 ml/m  AORTIC VALVE                    PULMONIC VALVE AV Area (Vmax):    2.48 cm     PV Vmax:       0.75 m/s AV Area (Vmean):   2.42 cm     PV Peak grad:  2.3 mmHg AV Area (VTI):     2.20 cm AV Vmax:           118.00 cm/s AV Vmean:          79.000 cm/s AV VTI:            0.214 m AV Peak Grad:      5.6 mmHg AV Mean Grad:      3.0 mmHg LVOT Vmax:         115.00 cm/s LVOT Vmean:        75.100 cm/s LVOT VTI:          0.185 m LVOT/AV VTI ratio: 0.86  AORTA Ao Root diam: 3.10 cm Ao Asc diam:  3.20 cm MITRAL VALVE                TRICUSPID VALVE MV Area (PHT): 5.97 cm     TR Peak grad:   31.8 mmHg MV Area VTI:   2.54 cm     TR Vmax:        282.00 cm/s MV Peak grad:  8.0 mmHg MV Mean grad:  3.0 mmHg     SHUNTS MV Vmax:       1.41 m/s     Systemic VTI:  0.18 m MV Vmean:      75.5 cm/s    Systemic Diam: 1.80 cm MV Decel Time: 127 msec MV E velocity: 66.80 cm/s MV A velocity: 112.00 cm/s MV E/A ratio:  0.60 Carlyle Dolly MD  Electronically signed by Carlyle Dolly MD Signature Date/Time: 08/17/2021/3:31:03 PM    Final    CT HEAD CODE STROKE WO CONTRAST  Result Date: 08/13/2021 CLINICAL DATA:  Code stroke. Initial evaluation for acute stroke, left facial droop. EXAM: CT HEAD WITHOUT CONTRAST TECHNIQUE: Contiguous axial images were obtained from the base of the skull through the vertex without intravenous contrast. RADIATION DOSE REDUCTION: This exam was performed according to the departmental dose-optimization program which includes automated exposure control, adjustment of the mA and/or kV according to patient size and/or use of iterative reconstruction technique. COMPARISON:  Prior head CT from 08/04/2021. FINDINGS: Brain: Moderately advanced cerebral atrophy with chronic small vessel ischemic disease. No acute intracranial hemorrhage. No acute large vessel territory infarct. No mass lesion, midline shift or mass effect. No hydrocephalus or extra-axial fluid collection. Vascular: No hyperdense vessel. Calcified atherosclerosis present at the skull base. Skull: Scalp soft tissues and calvarium within normal limits. Sinuses/Orbits: Globes and orbital soft tissues demonstrate no acute finding. Visualized paranasal sinuses and mastoid air cells are clear. Other: None. ASPECTS [facility] Stroke Program Early CT Score) - Ganglionic level infarction (caudate, lentiform nuclei, internal capsule, insula, M1-M3 cortex): 7 - Supraganglionic infarction (M4-M6 cortex): 3 Total score (0-10 with 10 being normal): 10 IMPRESSION: 1. No acute intracranial infarct or other abnormality. 2. ASPECTS is 10. 3. Moderately advanced cerebral atrophy with chronic small vessel ischemic disease. Results were called by telephone at the time of interpretation on 08/13/2021 at 10:52 pm to provider [facility] , who verbally acknowledged these results. Electronically Signed   By: Jeannine Boga M.D.   On: 08/13/2021  22:52   [place] EKG SITE RITE  Result Date:  08/14/2021 If Site Rite image not attached, placement could not be confirmed due to current cardiac rhythm.   CT head, CTA head and neck, MRI brain, echocardiogram.   Subjective: Patient was seen and examined at bedside.  Overnight events noted.  Patient reports feeling much improved.  Electrolytes been normal.  She is doing better,  Patient is being discharged to a skilled nursing facility for rehab.  Discharge Exam: Vitals:   08/23/21 0509 08/23/21 1028  BP: (!) 142/81 112/79  Pulse: (!) 103 (!) 108  Resp: 18   Temp: 97.7 F (36.5 C)   SpO2: 92%    Vitals:   08/22/21 1431 08/22/21 2311 08/23/21 0509 08/23/21 1028  BP: (!) 98/41 113/79 (!) 142/81 112/79  Pulse: (!) 101 (!) 109 (!) 103 (!) 108  Resp: $Remo'17 18 18   'IMolI$ Temp: 98 F (36.7 C) 98 F (36.7 C) 97.7 F (36.5 C)   TempSrc: Oral     SpO2: 95% 94% 92%   Weight:      Height:        General: Pt is alert, awake, not in acute distress Cardiovascular: RRR, S1/S2 +, no rubs, no gallops Respiratory: CTA bilaterally, no wheezing, no rhonchi Abdominal: Soft, NT, ND, bowel sounds + Extremities: no edema, no cyanosis    The results of significant diagnostics from this hospitalization (including imaging, microbiology, ancillary and laboratory) are listed below for reference.     Microbiology: Recent Results (from the past 240 hour(s))  Resp Panel by RT-PCR (Flu A&B, Covid) Nasopharyngeal Swab     Status: None   Collection Time: 08/13/21  9:54 PM   Specimen: Nasopharyngeal Swab; Nasopharyngeal(NP) swabs in vial transport medium  Result Value Ref Range Status   SARS Coronavirus 2 by RT PCR NEGATIVE NEGATIVE Final    Comment: (NOTE) SARS-CoV-2 target nucleic acids are NOT DETECTED.  The SARS-CoV-2 RNA is generally detectable in upper respiratory specimens during the acute phase of infection. The lowest concentration of SARS-CoV-2 viral copies this assay can detect is 138 copies/mL. A negative result does not preclude  SARS-Cov-2 infection and should not be used as the sole basis for treatment or other patient management decisions. A negative result may occur with  improper specimen collection/handling, submission of specimen other than nasopharyngeal swab, presence of viral mutation(s) within the areas targeted by this assay, and inadequate number of viral copies(<138 copies/mL). A negative result must be combined with clinical observations, patient history, and epidemiological information. The expected result is Negative.  Fact Sheet for Patients:  EntrepreneurPulse.com.au  Fact Sheet for Healthcare Providers:  IncredibleEmployment.be  This test is no t yet approved or cleared by the [place] FDA and  has been authorized for detection and/or diagnosis of SARS-CoV-2 by FDA under an Emergency Use Authorization (EUA). This EUA will remain  in effect (meaning this test can be used) for the duration of the COVID-19 declaration under Section 564(b)(1) of the Act, 21 U.S.C.section 360bbb-3(b)(1), unless the authorization is terminated  or revoked sooner.       Influenza A by PCR NEGATIVE NEGATIVE Final   Influenza B by PCR NEGATIVE NEGATIVE Final    Comment: (NOTE) The Xpert Xpress SARS-CoV-2/FLU/RSV plus assay is intended as an aid in the diagnosis of influenza from Nasopharyngeal swab specimens and should not be used as a sole basis for treatment. Nasal washings and aspirates are unacceptable for Xpert Xpress SARS-CoV-2/FLU/RSV testing.  Fact Sheet for Patients:  EntrepreneurPulse.com.au  Fact Sheet for Healthcare Providers: IncredibleEmployment.be  This test is not yet approved or cleared by the [place] FDA and has been authorized for detection and/or diagnosis of SARS-CoV-2 by FDA under an Emergency Use Authorization (EUA). This EUA will remain in effect (meaning this test can be used) for the duration of  the COVID-19 declaration under Section 564(b)(1) of the Act, 21 U.S.C. section 360bbb-3(b)(1), unless the authorization is terminated or revoked.  Performed at [facility], [address] [place], [place] [address]      Labs: BNP (last 3 results) No results for input(s): BNP in the last 8760 hours. Basic Metabolic Panel: Recent Labs  Lab 08/19/21 0601 08/20/21 0538 08/20/21 1310 08/21/21 0451 08/22/21 0515 08/23/21 0543  NA  --  139 137 138 135 138  K  --  2.0* 3.1* 3.8 3.3* 3.9  CL  --  106 108 111 110 111  CO2  --  19* 17* 18* 16* 18*  GLUCOSE  --  112* 170* 82 93 92  BUN  --  $R'8 9 9 8 10  'lR$ CREATININE  --  1.43* 1.38* 1.18* 1.01* 0.85  CALCIUM  --  7.0* 6.8* 7.2* 7.7* 8.8*  MG  --  1.1*  --  1.6* 1.6* 1.9  PHOS 6.4* 6.1*  --  2.4* 2.2* 2.4*   Liver Function Tests: No results for input(s): AST, ALT, ALKPHOS, BILITOT, PROT, ALBUMIN in the last 168 hours. No results for input(s): LIPASE, AMYLASE in the last 168 hours. No results for input(s): AMMONIA in the last 168 hours. CBC: Recent Labs  Lab 08/17/21 0457 08/20/21 0538 08/20/21 1310 08/21/21 0451 08/21/21 1900 08/22/21 0515 08/23/21 0543  WBC 10.7* 10.8*  --  9.7  --  11.7* 13.1*  HGB 8.2* 7.6* 7.2* 6.6* 9.8* 9.3* 9.9*  HCT 26.5* 22.3* 21.5* 20.2* 29.3* 26.4* 28.9*  MCV 109.1* 99.1  --  98.5  --  94.6 95.4  PLT 208 241  --  222  --  204 244   Cardiac Enzymes: No results for input(s): CKTOTAL, CKMB, CKMBINDEX, TROPONINI in the last 168 hours. BNP: Invalid input(s): POCBNP CBG: No results for input(s): GLUCAP in the last 168 hours. D-Dimer No results for input(s): DDIMER in the last 72 hours. Hgb A1c No results for input(s): HGBA1C in the last 72 hours. Lipid Profile No results for input(s): CHOL, HDL, LDLCALC, TRIG, CHOLHDL, LDLDIRECT in the last 72 hours. Thyroid function studies No results for input(s): TSH, T4TOTAL, T3FREE, THYROIDAB in the last 72 hours.  Invalid input(s): FREET3 Anemia work  up No results for input(s): VITAMINB12, FOLATE, FERRITIN, TIBC, IRON, RETICCTPCT in the last 72 hours. Urinalysis    Component Value Date/Time   COLORURINE YELLOW 08/14/2021 0922   APPEARANCEUR HAZY (A) 08/14/2021 0922   LABSPEC 1.016 08/14/2021 0922   PHURINE 5.0 08/14/2021 0922   GLUCOSEU NEGATIVE 08/14/2021 0922   HGBUR SMALL (A) 08/14/2021 0922   BILIRUBINUR NEGATIVE 08/14/2021 0922   KETONESUR NEGATIVE 08/14/2021 0922   PROTEINUR NEGATIVE 08/14/2021 0922   UROBILINOGEN 0.2 04/01/2015 1140   NITRITE NEGATIVE 08/14/2021 0922   LEUKOCYTESUR SMALL (A) 08/14/2021 0922   Sepsis Labs Invalid input(s): PROCALCITONIN,  WBC,  LACTICIDVEN Microbiology Recent Results (from the past 240 hour(s))  Resp Panel by RT-PCR (Flu A&B, Covid) Nasopharyngeal Swab     Status: None   Collection Time: 08/13/21  9:54 PM   Specimen: Nasopharyngeal Swab; Nasopharyngeal(NP) swabs in vial transport medium  Result Value Ref Range Status  SARS Coronavirus 2 by RT PCR NEGATIVE NEGATIVE Final    Comment: (NOTE) SARS-CoV-2 target nucleic acids are NOT DETECTED.  The SARS-CoV-2 RNA is generally detectable in upper respiratory specimens during the acute phase of infection. The lowest concentration of SARS-CoV-2 viral copies this assay can detect is 138 copies/mL. A negative result does not preclude SARS-Cov-2 infection and should not be used as the sole basis for treatment or other patient management decisions. A negative result may occur with  improper specimen collection/handling, submission of specimen other than nasopharyngeal swab, presence of viral mutation(s) within the areas targeted by this assay, and inadequate number of viral copies(<138 copies/mL). A negative result must be combined with clinical observations, patient history, and epidemiological information. The expected result is Negative.  Fact Sheet for Patients:  EntrepreneurPulse.com.au  Fact Sheet for Healthcare  Providers:  IncredibleEmployment.be  This test is no t yet approved or cleared by the [place] FDA and  has been authorized for detection and/or diagnosis of SARS-CoV-2 by FDA under an Emergency Use Authorization (EUA). This EUA will remain  in effect (meaning this test can be used) for the duration of the COVID-19 declaration under Section 564(b)(1) of the Act, 21 U.S.C.section 360bbb-3(b)(1), unless the authorization is terminated  or revoked sooner.       Influenza A by PCR NEGATIVE NEGATIVE Final   Influenza B by PCR NEGATIVE NEGATIVE Final    Comment: (NOTE) The Xpert Xpress SARS-CoV-2/FLU/RSV plus assay is intended as an aid in the diagnosis of influenza from Nasopharyngeal swab specimens and should not be used as a sole basis for treatment. Nasal washings and aspirates are unacceptable for Xpert Xpress SARS-CoV-2/FLU/RSV testing.  Fact Sheet for Patients: EntrepreneurPulse.com.au  Fact Sheet for Healthcare Providers: IncredibleEmployment.be  This test is not yet approved or cleared by the [place] FDA and has been authorized for detection and/or diagnosis of SARS-CoV-2 by FDA under an Emergency Use Authorization (EUA). This EUA will remain in effect (meaning this test can be used) for the duration of the COVID-19 declaration under Section 564(b)(1) of the Act, 21 U.S.C. section 360bbb-3(b)(1), unless the authorization is terminated or revoked.  Performed at [facility], [address] [place], [place] [address]      Time coordinating discharge: Over 30 minutes  SIGNED:   Shawna Clamp, MD  [facility] 08/23/2021, 10:48 AM Pager   If 7PM-7AM, please contact night-coverage

## 2021-08-23 NOTE — TOC Progression Note (Signed)
Transition of Care [facility]) - Progression Note    Patient Details  Name: Colleen Lee MRN: [id number] Date of Birth: Apr 11, 1956  [facility] CM/SW Contact  Cynthya Yam, [place], [place] Phone Number: 08/23/2021, 8:59 AM  Clinical Narrative:    Discharge summary sent to [facility] to review for admission today. VM left with Debbie, admissions coordinator to confirm room number and contact to call in report.  [address] LCSW Transition of Care [phone]    Expected Discharge Plan: Skilled Nursing Facility Barriers to Discharge: Continued Medical Work up  Expected Discharge Plan and Services Expected Discharge Plan: [place] In-house Referral: Clinical Social Work   Post Acute Care Choice: [place] Living arrangements for the past 2 months: Single Family Home Expected Discharge Date: 08/23/21                                     Social Determinants of Health (SDOH) Interventions    Readmission Risk Interventions Readmission Risk Prevention Plan 08/14/2021 07/16/2021 05/19/2021  Transportation Screening Complete Complete Complete  HRI or Home Care Consult - - Complete  Social Work Consult for [place] Planning/Counseling - - Complete  Palliative Care Screening - - Not Applicable  Medication Review Press photographer) Complete Complete Complete  HRI or Home Care Consult Complete Complete -  SW Recovery Care/Counseling Consult Complete Complete -  Palliative Care Screening - Not Applicable -  [place] Complete Complete -  Some recent data might be hidden

## 2021-08-23 NOTE — TOC Transition Note (Addendum)
Transition of Care [facility]) - CM/SW Discharge Note   Patient Details  Name: Colleen Lee MRN: [id number] Date of Birth: Mar 03, 1956  Transition of Care [facility]) CM/SW Contact:  Elliot Gurney [place], [place] Phone Number: 08/23/2021, 10:51 AM   Clinical Narrative:    Patient to discharge to [facility] today. Patient will be going to room A7-1. RN to call report to [phone].  Patient to be transported by [facility].  11:30am [facility] contacted, transportation arranged. [name] sister-notified of patient's discharge today to [facility]  [place], LCSW Transition of Care [phone]      Barriers to Discharge: Continued Medical Work up   Patient Goals and CMS Choice Patient states their goals for this hospitalization and ongoing recovery are:: return home CMS Medicare.gov Compare Post Acute Care list provided to:: Patient Represenative (must comment) Choice offered to / list presented to : Sibling  Discharge Placement    [facility] [place]                   Discharge Plan and Services In-house Referral: Clinical Social Work   Post Acute Care Choice: Skilled Nursing Facility                               Social Determinants of Health (SDOH) Interventions     Readmission Risk Interventions Readmission Risk Prevention Plan 08/14/2021 07/16/2021 05/19/2021  Transportation Screening Complete Complete Complete  HRI or Home Care Consult - - Complete  Social Work Consult for [place] Planning/Counseling - - Complete  Palliative Care Screening - - Not Applicable  Medication Review Press photographer) Complete Complete Complete  HRI or Home Care Consult Complete Complete -  SW Recovery Care/Counseling Consult Complete Complete -  Palliative Care Screening - Not Applicable -  Skilled Nursing Facility Complete Complete -  Some recent data might be hidden

## 2021-08-24 ENCOUNTER — Encounter (HOSPITAL_COMMUNITY): Payer: Self-pay

## 2021-08-24 ENCOUNTER — Emergency Department (HOSPITAL_COMMUNITY): Payer: Medicare Other

## 2021-08-24 ENCOUNTER — Other Ambulatory Visit: Payer: Self-pay

## 2021-08-24 ENCOUNTER — Emergency Department (HOSPITAL_COMMUNITY)
Admission: EM | Admit: 2021-08-24 | Discharge: 2021-08-24 | Disposition: A | Discharge status: Home / Self Care | Payer: Medicare Other | Source: Home / Self Care | Attending: Emergency Medicine | Admitting: Emergency Medicine

## 2021-08-24 DIAGNOSIS — R5381 Other malaise: Secondary | ICD-10-CM | POA: Diagnosis not present

## 2021-08-24 DIAGNOSIS — Z515 Encounter for palliative care: Secondary | ICD-10-CM | POA: Diagnosis not present

## 2021-08-24 DIAGNOSIS — E8779 Other fluid overload: Secondary | ICD-10-CM | POA: Diagnosis not present

## 2021-08-24 DIAGNOSIS — I471 Supraventricular tachycardia: Secondary | ICD-10-CM | POA: Diagnosis not present

## 2021-08-24 DIAGNOSIS — Z7982 Long term (current) use of aspirin: Secondary | ICD-10-CM | POA: Insufficient documentation

## 2021-08-24 DIAGNOSIS — I10 Essential (primary) hypertension: Secondary | ICD-10-CM | POA: Diagnosis not present

## 2021-08-24 DIAGNOSIS — L89626 Pressure-induced deep tissue damage of left heel: Secondary | ICD-10-CM | POA: Diagnosis not present

## 2021-08-24 DIAGNOSIS — G9341 Metabolic encephalopathy: Secondary | ICD-10-CM | POA: Diagnosis not present

## 2021-08-24 DIAGNOSIS — Z7401 Bed confinement status: Secondary | ICD-10-CM | POA: Diagnosis not present

## 2021-08-24 DIAGNOSIS — Z743 Need for continuous supervision: Secondary | ICD-10-CM | POA: Diagnosis not present

## 2021-08-24 DIAGNOSIS — R279 Unspecified lack of coordination: Secondary | ICD-10-CM | POA: Diagnosis not present

## 2021-08-24 DIAGNOSIS — A419 Sepsis, unspecified organism: Secondary | ICD-10-CM | POA: Diagnosis not present

## 2021-08-24 DIAGNOSIS — I2693 Single subsegmental pulmonary embolism without acute cor pulmonale: Secondary | ICD-10-CM | POA: Diagnosis not present

## 2021-08-24 DIAGNOSIS — Z66 Do not resuscitate: Secondary | ICD-10-CM | POA: Diagnosis not present

## 2021-08-24 DIAGNOSIS — R6520 Severe sepsis without septic shock: Secondary | ICD-10-CM | POA: Diagnosis not present

## 2021-08-24 DIAGNOSIS — J45909 Unspecified asthma, uncomplicated: Secondary | ICD-10-CM | POA: Diagnosis not present

## 2021-08-24 DIAGNOSIS — A4159 Other Gram-negative sepsis: Secondary | ICD-10-CM | POA: Diagnosis not present

## 2021-08-24 DIAGNOSIS — R0902 Hypoxemia: Secondary | ICD-10-CM | POA: Insufficient documentation

## 2021-08-24 DIAGNOSIS — R410 Disorientation, unspecified: Secondary | ICD-10-CM | POA: Diagnosis not present

## 2021-08-24 DIAGNOSIS — I639 Cerebral infarction, unspecified: Secondary | ICD-10-CM | POA: Diagnosis not present

## 2021-08-24 DIAGNOSIS — Z20822 Contact with and (suspected) exposure to covid-19: Secondary | ICD-10-CM | POA: Diagnosis not present

## 2021-08-24 DIAGNOSIS — R000 Tachycardia, unspecified: Secondary | ICD-10-CM | POA: Insufficient documentation

## 2021-08-24 DIAGNOSIS — J9621 Acute and chronic respiratory failure with hypoxia: Secondary | ICD-10-CM | POA: Diagnosis not present

## 2021-08-24 DIAGNOSIS — M797 Fibromyalgia: Secondary | ICD-10-CM | POA: Diagnosis not present

## 2021-08-24 DIAGNOSIS — N390 Urinary tract infection, site not specified: Secondary | ICD-10-CM | POA: Diagnosis not present

## 2021-08-24 DIAGNOSIS — D638 Anemia in other chronic diseases classified elsewhere: Secondary | ICD-10-CM | POA: Diagnosis not present

## 2021-08-24 DIAGNOSIS — I499 Cardiac arrhythmia, unspecified: Secondary | ICD-10-CM | POA: Diagnosis not present

## 2021-08-24 DIAGNOSIS — E519 Thiamine deficiency, unspecified: Secondary | ICD-10-CM | POA: Diagnosis not present

## 2021-08-24 DIAGNOSIS — R404 Transient alteration of awareness: Secondary | ICD-10-CM | POA: Diagnosis not present

## 2021-08-24 DIAGNOSIS — R627 Adult failure to thrive: Secondary | ICD-10-CM | POA: Diagnosis not present

## 2021-08-24 DIAGNOSIS — E43 Unspecified severe protein-calorie malnutrition: Secondary | ICD-10-CM | POA: Diagnosis not present

## 2021-08-24 DIAGNOSIS — E8809 Other disorders of plasma-protein metabolism, not elsewhere classified: Secondary | ICD-10-CM | POA: Diagnosis not present

## 2021-08-24 DIAGNOSIS — L89154 Pressure ulcer of sacral region, stage 4: Secondary | ICD-10-CM | POA: Diagnosis not present

## 2021-08-24 MED ORDER — METOPROLOL TARTRATE 50 MG PO TABS
50.0000 mg | ORAL_TABLET | Freq: Once | ORAL | Status: AC
  Administered 2021-08-24: 50 mg via ORAL
  Filled 2021-08-24: qty 1

## 2021-08-24 MED ORDER — ASPIRIN 81 MG PO CHEW
81.0000 mg | CHEWABLE_TABLET | Freq: Once | ORAL | Status: AC
  Administered 2021-08-24: 81 mg via ORAL
  Filled 2021-08-24: qty 1

## 2021-08-24 NOTE — ED Provider Notes (Signed)
[facility] EMERGENCY DEPARTMENT Provider Note   CSN: [id number] Arrival date & time: 08/24/21  2878     History  Chief Complaint  Patient presents with   Altered Mental Status    Colleen Lee is a 66 y.o. female.  Pt's husband reports pt did not have oxygen on at the facility.  He reports pt needed oxygen.  He report oxygen was there but pt did not have on.  He is concerned that pt is oxygen deprived.  He also reports pt has not had her medications.  Pt discharged from facility yesterday  The history is provided by the patient. No language interpreter was used.  Altered Mental Status Associated symptoms: no abdominal pain       Home Medications Prior to Admission medications   Medication Sig Start Date End Date Taking? Authorizing Provider  albuterol (VENTOLIN HFA) 108 (90 Base) MCG/ACT inhaler Inhale 2 puffs into the lungs every 6 (six) hours as needed for wheezing or shortness of breath. 07/16/21   Roxan Hockey, MD  allopurinol (ZYLOPRIM) 300 MG tablet Take 300 mg by mouth daily. 04/15/21   [provider]  amLODipine (NORVASC) 5 MG tablet Take 1 tablet (5 mg total) by mouth daily. 08/23/21   Shawna Clamp, MD  aspirin EC 81 MG EC tablet Take 1 tablet (81 mg total) by mouth daily. Swallow whole. 08/23/21   Shawna Clamp, MD  Cholecalciferol (DIALYVITE VITAMIN D3 MAX) 1.25 MG (50000 UT) TABS Take 1 tablet by mouth every 7 (seven) days.    [provider]  cyclobenzaprine (FLEXERIL) 5 MG tablet Take 5 mg by mouth 3 (three) times daily. 04/15/21   [provider]  folic acid (FOLVITE) 1 MG tablet Take 1 tablet (1 mg total) by mouth daily. 01/06/21   Barton Dubois, MD  gabapentin (NEURONTIN) 100 MG capsule Take 1 capsule (100 mg total) by mouth 3 (three) times daily. Patient taking differently: Take 100 mg by mouth daily. 12/22/18   Carole Civil, MD  HYDROcodone-acetaminophen (NORCO/VICODIN) 5-325 MG tablet Take 1 tablet by mouth every 6 (six)  hours as needed. Patient taking differently: Take 1 tablet by mouth every 6 (six) hours as needed for moderate pain. 06/08/21   Noemi Chapel, MD  hydrOXYzine (ATARAX) 10 MG tablet Take 1 tablet (10 mg total) by mouth daily as needed. Patient taking differently: Take 10 mg by mouth daily as needed for anxiety. 07/16/21   Roxan Hockey, MD  lactulose (CHRONULAC) 10 GM/15ML solution Take 30 mLs (20 g total) by mouth 2 (two) times daily as needed for mild constipation or moderate constipation. 07/16/21   Roxan Hockey, MD  megestrol (MEGACE) 40 MG tablet Take 1 tablet (40 mg total) by mouth daily. 07/16/21   Roxan Hockey, MD  metoprolol tartrate (LOPRESSOR) 50 MG tablet Take 1 tablet (50 mg total) by mouth 2 (two) times daily. 08/23/21   Shawna Clamp, MD  naloxone [facility]) nasal spray 4 mg/0.1 mL See admin instructions. ADMINISTER A SINGLE SPRAYEOF NARCAN INTO ONECNOSTRIL. REPEAT AFTER 3SMINUTES IN OTHER NOSTRIL IF NO RESPONSE. 10/03/20   [provider]  ondansetron (ZOFRAN) 4 MG tablet Take 1 tablet (4 mg total) by mouth every 6 (six) hours as needed for nausea. 07/16/21   Roxan Hockey, MD  pantoprazole (PROTONIX) 40 MG tablet Take 1 tablet (40 mg total) by mouth 2 (two) times daily. 07/16/21 07/16/22  Roxan Hockey, MD  Plecanatide (TRULANCE) 3 MG TABS Take 3 mg by mouth daily. 06/30/21 06/30/22  Rourk, Cristopher Estimable, MD  potassium chloride (MICRO-K) 10 MEQ CR capsule Take 20 mEq by mouth daily. 05/30/21   [provider]  sodium bicarbonate 650 MG tablet Take 1 tablet (650 mg total) by mouth 3 (three) times daily. 08/23/21   Shawna Clamp, MD      Allergies    Penicillins    Review of Systems   Review of Systems  Gastrointestinal:  Negative for abdominal pain.  All other systems reviewed and are negative.  Physical Exam Updated Vital Signs BP 133/87    Pulse (!) 138    Temp 99.1 F (37.3 C)    Resp (!) 24    Ht 5\' 6"  (1.676 m)    Wt 60.8 kg    SpO2 98%    BMI  21.63 kg/m  Physical Exam Vitals and nursing note reviewed.  Constitutional:      Appearance: She is well-developed.  HENT:     Lee: Normocephalic.     Mouth/Throat:     Mouth: Mucous membranes are moist.  Cardiovascular:     Rate and Rhythm: Tachycardia present.  Pulmonary:     Effort: Pulmonary effort is normal.  Abdominal:     General: Abdomen is flat. There is no distension.  Musculoskeletal:        General: Normal range of motion.     Cervical back: Normal range of motion.  Skin:    General: Skin is warm.  Neurological:     General: No focal deficit present.     Mental Status: She is alert and oriented to person, place, and time.    ED Results / Procedures / Treatments   Labs (all labs ordered are listed, but only abnormal results are displayed) Labs Reviewed - No data to display  EKG None  Radiology No results found.  Procedures Procedures    Medications Ordered in ED Medications  metoprolol tartrate (LOPRESSOR) tablet 50 mg (has no administration in time range)  aspirin chewable tablet 81 mg (has no administration in time range)    ED Course/ Medical Decision Making/ A&P                           Medical Decision Making Amount and/or Complexity of Data Reviewed Independent Historian: spouse External Data Reviewed: notes.    Details: [facility] records and hospitalist notes reviewed  Risk OTC drugs. Prescription drug management. Risk Details: Pt given her metoprolol and asa.   Dr. Roderic Palau in to see.  Pt can return to facility    MDM:  Pt is tachycardic, Pt on 2 liters 02 at sats are 98%.          Final Clinical Impression(s) / ED Diagnoses Final diagnoses:  Hypoxia    Rx / DC Orders ED Discharge Orders     None      An After Visit Summary was printed and given to the patient.    Sidney Ace 08/24/21 1208    [place] Ferguson, MD 08/26/21 [phone]

## 2021-08-24 NOTE — ED Notes (Signed)
Patient to return to [facility] via EMS per SNF staff. Husband is at bedside and is aware that she is being discharged.

## 2021-08-24 NOTE — ED Notes (Signed)
Unable to get IV access/blood draw. MD aware and states to hold off for now. States that we will give PO meds and monitor for time being with possible d/c back to SNF.

## 2021-08-24 NOTE — Discharge Instructions (Signed)
Please make sure pt receives her medications and has oxygenon

## 2021-08-24 NOTE — ED Triage Notes (Signed)
Patient discharged from this hospital yesterday to SNF. Family upset at time of discharge that patient was being sent there because they felt she had not returned to baseline and vital signs unstable. Facility sent to ED for AMS. States they do not know her baseline. Last well known time unknown. Family in room now and states that she was sent out this am for hypoxia. States that she was discharged there with no medications etc.

## 2021-08-25 ENCOUNTER — Other Ambulatory Visit: Payer: Self-pay

## 2021-08-25 ENCOUNTER — Inpatient Hospital Stay (HOSPITAL_COMMUNITY)
Admission: EM | Admit: 2021-08-25 | Discharge: 2021-09-02 | DRG: 871 | Disposition: E | Payer: Medicare Other | Attending: Internal Medicine | Admitting: Internal Medicine

## 2021-08-25 ENCOUNTER — Encounter (HOSPITAL_COMMUNITY): Payer: Self-pay

## 2021-08-25 ENCOUNTER — Emergency Department (HOSPITAL_COMMUNITY): Payer: Medicare Other

## 2021-08-25 DIAGNOSIS — I2699 Other pulmonary embolism without acute cor pulmonale: Secondary | ICD-10-CM | POA: Diagnosis not present

## 2021-08-25 DIAGNOSIS — E876 Hypokalemia: Secondary | ICD-10-CM | POA: Diagnosis not present

## 2021-08-25 DIAGNOSIS — J181 Lobar pneumonia, unspecified organism: Secondary | ICD-10-CM | POA: Diagnosis present

## 2021-08-25 DIAGNOSIS — E860 Dehydration: Secondary | ICD-10-CM | POA: Diagnosis present

## 2021-08-25 DIAGNOSIS — Z515 Encounter for palliative care: Secondary | ICD-10-CM

## 2021-08-25 DIAGNOSIS — Z66 Do not resuscitate: Secondary | ICD-10-CM | POA: Diagnosis not present

## 2021-08-25 DIAGNOSIS — G8929 Other chronic pain: Secondary | ICD-10-CM | POA: Diagnosis present

## 2021-08-25 DIAGNOSIS — E43 Unspecified severe protein-calorie malnutrition: Secondary | ICD-10-CM | POA: Diagnosis present

## 2021-08-25 DIAGNOSIS — Z7982 Long term (current) use of aspirin: Secondary | ICD-10-CM

## 2021-08-25 DIAGNOSIS — R509 Fever, unspecified: Secondary | ICD-10-CM | POA: Diagnosis not present

## 2021-08-25 DIAGNOSIS — K219 Gastro-esophageal reflux disease without esophagitis: Secondary | ICD-10-CM | POA: Diagnosis present

## 2021-08-25 DIAGNOSIS — Z6822 Body mass index (BMI) 22.0-22.9, adult: Secondary | ICD-10-CM

## 2021-08-25 DIAGNOSIS — T503X5A Adverse effect of electrolytic, caloric and water-balance agents, initial encounter: Secondary | ICD-10-CM | POA: Diagnosis not present

## 2021-08-25 DIAGNOSIS — Y95 Nosocomial condition: Secondary | ICD-10-CM | POA: Diagnosis present

## 2021-08-25 DIAGNOSIS — R7989 Other specified abnormal findings of blood chemistry: Secondary | ICD-10-CM | POA: Diagnosis not present

## 2021-08-25 DIAGNOSIS — R404 Transient alteration of awareness: Secondary | ICD-10-CM | POA: Diagnosis not present

## 2021-08-25 DIAGNOSIS — J9620 Acute and chronic respiratory failure, unspecified whether with hypoxia or hypercapnia: Secondary | ICD-10-CM | POA: Diagnosis present

## 2021-08-25 DIAGNOSIS — D6489 Other specified anemias: Secondary | ICD-10-CM | POA: Diagnosis not present

## 2021-08-25 DIAGNOSIS — F1011 Alcohol abuse, in remission: Secondary | ICD-10-CM | POA: Diagnosis present

## 2021-08-25 DIAGNOSIS — Z8042 Family history of malignant neoplasm of prostate: Secondary | ICD-10-CM

## 2021-08-25 DIAGNOSIS — J45909 Unspecified asthma, uncomplicated: Secondary | ICD-10-CM | POA: Diagnosis present

## 2021-08-25 DIAGNOSIS — R410 Disorientation, unspecified: Secondary | ICD-10-CM | POA: Diagnosis not present

## 2021-08-25 DIAGNOSIS — Z20822 Contact with and (suspected) exposure to covid-19: Secondary | ICD-10-CM | POA: Diagnosis present

## 2021-08-25 DIAGNOSIS — Z743 Need for continuous supervision: Secondary | ICD-10-CM | POA: Diagnosis not present

## 2021-08-25 DIAGNOSIS — I471 Supraventricular tachycardia: Secondary | ICD-10-CM | POA: Diagnosis not present

## 2021-08-25 DIAGNOSIS — I499 Cardiac arrhythmia, unspecified: Secondary | ICD-10-CM | POA: Diagnosis not present

## 2021-08-25 DIAGNOSIS — D638 Anemia in other chronic diseases classified elsewhere: Secondary | ICD-10-CM | POA: Diagnosis not present

## 2021-08-25 DIAGNOSIS — N390 Urinary tract infection, site not specified: Secondary | ICD-10-CM | POA: Diagnosis present

## 2021-08-25 DIAGNOSIS — L89154 Pressure ulcer of sacral region, stage 4: Secondary | ICD-10-CM | POA: Diagnosis not present

## 2021-08-25 DIAGNOSIS — J690 Pneumonitis due to inhalation of food and vomit: Secondary | ICD-10-CM

## 2021-08-25 DIAGNOSIS — R109 Unspecified abdominal pain: Secondary | ICD-10-CM | POA: Diagnosis not present

## 2021-08-25 DIAGNOSIS — R627 Adult failure to thrive: Secondary | ICD-10-CM | POA: Diagnosis present

## 2021-08-25 DIAGNOSIS — I2693 Single subsegmental pulmonary embolism without acute cor pulmonale: Secondary | ICD-10-CM | POA: Diagnosis not present

## 2021-08-25 DIAGNOSIS — B182 Chronic viral hepatitis C: Secondary | ICD-10-CM | POA: Diagnosis present

## 2021-08-25 DIAGNOSIS — A419 Sepsis, unspecified organism: Secondary | ICD-10-CM | POA: Diagnosis present

## 2021-08-25 DIAGNOSIS — E8809 Other disorders of plasma-protein metabolism, not elsewhere classified: Secondary | ICD-10-CM | POA: Diagnosis not present

## 2021-08-25 DIAGNOSIS — Z96652 Presence of left artificial knee joint: Secondary | ICD-10-CM | POA: Diagnosis present

## 2021-08-25 DIAGNOSIS — K7030 Alcoholic cirrhosis of liver without ascites: Secondary | ICD-10-CM | POA: Diagnosis present

## 2021-08-25 DIAGNOSIS — R0602 Shortness of breath: Secondary | ICD-10-CM | POA: Diagnosis not present

## 2021-08-25 DIAGNOSIS — B9689 Other specified bacterial agents as the cause of diseases classified elsewhere: Secondary | ICD-10-CM | POA: Diagnosis present

## 2021-08-25 DIAGNOSIS — J189 Pneumonia, unspecified organism: Secondary | ICD-10-CM

## 2021-08-25 DIAGNOSIS — I10 Essential (primary) hypertension: Secondary | ICD-10-CM | POA: Diagnosis present

## 2021-08-25 DIAGNOSIS — G9340 Encephalopathy, unspecified: Secondary | ICD-10-CM | POA: Diagnosis not present

## 2021-08-25 DIAGNOSIS — R0902 Hypoxemia: Secondary | ICD-10-CM | POA: Diagnosis not present

## 2021-08-25 DIAGNOSIS — K760 Fatty (change of) liver, not elsewhere classified: Secondary | ICD-10-CM | POA: Diagnosis not present

## 2021-08-25 DIAGNOSIS — K5909 Other constipation: Secondary | ICD-10-CM | POA: Diagnosis present

## 2021-08-25 DIAGNOSIS — A4159 Other Gram-negative sepsis: Principal | ICD-10-CM | POA: Diagnosis present

## 2021-08-25 DIAGNOSIS — Z807 Family history of other malignant neoplasms of lymphoid, hematopoietic and related tissues: Secondary | ICD-10-CM

## 2021-08-25 DIAGNOSIS — I700 Atherosclerosis of aorta: Secondary | ICD-10-CM | POA: Diagnosis not present

## 2021-08-25 DIAGNOSIS — J9621 Acute and chronic respiratory failure with hypoxia: Secondary | ICD-10-CM | POA: Diagnosis not present

## 2021-08-25 DIAGNOSIS — R000 Tachycardia, unspecified: Secondary | ICD-10-CM | POA: Diagnosis not present

## 2021-08-25 DIAGNOSIS — K7460 Unspecified cirrhosis of liver: Secondary | ICD-10-CM | POA: Diagnosis not present

## 2021-08-25 DIAGNOSIS — E8779 Other fluid overload: Secondary | ICD-10-CM | POA: Diagnosis not present

## 2021-08-25 DIAGNOSIS — R6520 Severe sepsis without septic shock: Secondary | ICD-10-CM | POA: Diagnosis not present

## 2021-08-25 DIAGNOSIS — Z825 Family history of asthma and other chronic lower respiratory diseases: Secondary | ICD-10-CM

## 2021-08-25 DIAGNOSIS — R7881 Bacteremia: Secondary | ICD-10-CM

## 2021-08-25 DIAGNOSIS — F17210 Nicotine dependence, cigarettes, uncomplicated: Secondary | ICD-10-CM | POA: Diagnosis present

## 2021-08-25 DIAGNOSIS — Z880 Allergy status to penicillin: Secondary | ICD-10-CM

## 2021-08-25 DIAGNOSIS — M797 Fibromyalgia: Secondary | ICD-10-CM | POA: Diagnosis present

## 2021-08-25 DIAGNOSIS — L89626 Pressure-induced deep tissue damage of left heel: Secondary | ICD-10-CM | POA: Diagnosis not present

## 2021-08-25 DIAGNOSIS — G9341 Metabolic encephalopathy: Secondary | ICD-10-CM | POA: Diagnosis not present

## 2021-08-25 DIAGNOSIS — J9690 Respiratory failure, unspecified, unspecified whether with hypoxia or hypercapnia: Secondary | ICD-10-CM | POA: Diagnosis not present

## 2021-08-25 DIAGNOSIS — F1010 Alcohol abuse, uncomplicated: Secondary | ICD-10-CM | POA: Diagnosis present

## 2021-08-25 DIAGNOSIS — Z801 Family history of malignant neoplasm of trachea, bronchus and lung: Secondary | ICD-10-CM

## 2021-08-25 DIAGNOSIS — Z800 Family history of malignant neoplasm of digestive organs: Secondary | ICD-10-CM

## 2021-08-25 DIAGNOSIS — Z79899 Other long term (current) drug therapy: Secondary | ICD-10-CM

## 2021-08-25 DIAGNOSIS — R0600 Dyspnea, unspecified: Secondary | ICD-10-CM

## 2021-08-25 DIAGNOSIS — R0689 Other abnormalities of breathing: Secondary | ICD-10-CM | POA: Diagnosis not present

## 2021-08-25 LAB — CBC WITH DIFFERENTIAL/PLATELET
Abs Immature Granulocytes: 0.11 10*3/uL — ABNORMAL HIGH (ref 0.00–0.07)
Basophils Absolute: 0 10*3/uL (ref 0.0–0.1)
Basophils Relative: 0 %
Eosinophils Absolute: 0 10*3/uL (ref 0.0–0.5)
Eosinophils Relative: 0 %
HCT: 31 % — ABNORMAL LOW (ref 36.0–46.0)
Hemoglobin: 10.4 g/dL — ABNORMAL LOW (ref 12.0–15.0)
Immature Granulocytes: 1 %
Lymphocytes Relative: 6 %
Lymphs Abs: 0.9 10*3/uL (ref 0.7–4.0)
MCH: 32.8 pg (ref 26.0–34.0)
MCHC: 33.5 g/dL (ref 30.0–36.0)
MCV: 97.8 fL (ref 80.0–100.0)
Monocytes Absolute: 0.4 10*3/uL (ref 0.1–1.0)
Monocytes Relative: 3 %
Neutro Abs: 12.2 10*3/uL — ABNORMAL HIGH (ref 1.7–7.7)
Neutrophils Relative %: 90 %
Platelets: 287 10*3/uL (ref 150–400)
RBC: 3.17 MIL/uL — ABNORMAL LOW (ref 3.87–5.11)
RDW: 18.3 % — ABNORMAL HIGH (ref 11.5–15.5)
WBC: 13.6 10*3/uL — ABNORMAL HIGH (ref 4.0–10.5)
nRBC: 0 % (ref 0.0–0.2)

## 2021-08-25 LAB — COMPREHENSIVE METABOLIC PANEL
ALT: 25 U/L (ref 0–44)
AST: 54 U/L — ABNORMAL HIGH (ref 15–41)
Albumin: 1.7 g/dL — ABNORMAL LOW (ref 3.5–5.0)
Alkaline Phosphatase: 424 U/L — ABNORMAL HIGH (ref 38–126)
Anion gap: 13 (ref 5–15)
BUN: 17 mg/dL (ref 8–23)
CO2: 21 mmol/L — ABNORMAL LOW (ref 22–32)
Calcium: 9.8 mg/dL (ref 8.9–10.3)
Chloride: 108 mmol/L (ref 98–111)
Creatinine, Ser: 1.02 mg/dL — ABNORMAL HIGH (ref 0.44–1.00)
GFR, Estimated: >60 mL/min (ref >60)
Glucose, Bld: 111 mg/dL — ABNORMAL HIGH (ref 70–99)
Potassium: 4.1 mmol/L (ref 3.5–5.1)
Sodium: 142 mmol/L (ref 135–145)
Total Bilirubin: 1.2 mg/dL (ref 0.3–1.2)
Total Protein: 6.4 g/dL — ABNORMAL LOW (ref 6.5–8.1)

## 2021-08-25 LAB — RAPID URINE DRUG SCREEN, HOSP PERFORMED
Amphetamines: NOT DETECTED
Barbiturates: NOT DETECTED
Benzodiazepines: POSITIVE — AB
Cocaine: NOT DETECTED
Opiates: POSITIVE — AB
Tetrahydrocannabinol: NOT DETECTED

## 2021-08-25 LAB — URINALYSIS, ROUTINE W REFLEX MICROSCOPIC
Bilirubin Urine: NEGATIVE
Glucose, UA: NEGATIVE mg/dL
Hgb urine dipstick: NEGATIVE
Ketones, ur: NEGATIVE mg/dL
Nitrite: POSITIVE — AB
Protein, ur: 30 mg/dL — AB
Specific Gravity, Urine: 1.016 (ref 1.005–1.030)
pH: 5 (ref 5.0–8.0)

## 2021-08-25 LAB — LACTIC ACID, PLASMA
Lactic Acid, Venous: 4.6 mmol/L (ref 0.5–1.9)
Lactic Acid, Venous: 4.5 mmol/L (ref 0.5–1.9)
Lactic Acid, Venous: 4.7 mmol/L (ref 0.5–1.9)

## 2021-08-25 LAB — ETHANOL: Alcohol, Ethyl (B): <10 mg/dL (ref <10)

## 2021-08-25 LAB — RESP PANEL BY RT-PCR (FLU A&B, COVID) ARPGX2
Influenza A by PCR: NEGATIVE
Influenza B by PCR: NEGATIVE
SARS Coronavirus 2 by RT PCR: NEGATIVE

## 2021-08-25 LAB — AMMONIA: Ammonia: 44 umol/L — ABNORMAL HIGH (ref 9–35)

## 2021-08-25 LAB — APTT: aPTT: 34 seconds (ref 24–36)

## 2021-08-25 MED ORDER — METOPROLOL TARTRATE 50 MG PO TABS
50.0000 mg | ORAL_TABLET | Freq: Two times a day (BID) | ORAL | Status: DC
  Administered 2021-08-26 – 2021-08-27 (×4): 50 mg via ORAL
  Filled 2021-08-25 (×4): qty 1

## 2021-08-25 MED ORDER — LACTATED RINGERS IV BOLUS
1000.0000 mL | Freq: Once | INTRAVENOUS | Status: AC
  Administered 2021-08-25: 23:00:00 1000 mL via INTRAVENOUS

## 2021-08-25 MED ORDER — SODIUM CHLORIDE 0.9 % IV SOLN: 2.0000 g | Freq: Once | INTRAVENOUS | Status: DC

## 2021-08-25 MED ORDER — SODIUM CHLORIDE 0.9 % IV SOLN: 1.0000 g | INTRAVENOUS | Status: DC

## 2021-08-25 MED ORDER — ONDANSETRON HCL 4 MG/2ML IJ SOLN: 4.0000 mg | Freq: Four times a day (QID) | INTRAMUSCULAR | Status: DC | PRN

## 2021-08-25 MED ORDER — AMLODIPINE BESYLATE 5 MG PO TABS: 5.0000 mg | ORAL_TABLET | Freq: Every day | ORAL | Status: DC

## 2021-08-25 MED ORDER — ASPIRIN EC 81 MG PO TBEC
81.0000 mg | DELAYED_RELEASE_TABLET | Freq: Every day | ORAL | Status: DC
  Administered 2021-08-26 – 2021-08-29 (×4): 81 mg via ORAL
  Filled 2021-08-25 (×4): qty 1

## 2021-08-25 MED ORDER — LACTULOSE ENEMA
300.0000 mL | Freq: Every day | ORAL | Status: DC
  Filled 2021-08-25 (×3): qty 300

## 2021-08-25 MED ORDER — MEGESTROL ACETATE 40 MG PO TABS
40.0000 mg | ORAL_TABLET | Freq: Every day | ORAL | Status: DC
  Filled 2021-08-25 (×3): qty 1

## 2021-08-25 MED ORDER — PANTOPRAZOLE SODIUM 40 MG PO TBEC
40.0000 mg | DELAYED_RELEASE_TABLET | Freq: Two times a day (BID) | ORAL | Status: DC
  Administered 2021-08-26 – 2021-08-29 (×8): 40 mg via ORAL
  Filled 2021-08-25 (×8): qty 1

## 2021-08-25 MED ORDER — ACETAMINOPHEN 650 MG RE SUPP
650.0000 mg | Freq: Four times a day (QID) | RECTAL | Status: DC | PRN
  Administered 2021-08-29: 650 mg via RECTAL
  Filled 2021-08-25: qty 1

## 2021-08-25 MED ORDER — LACTATED RINGERS IV BOLUS (SEPSIS)
1000.0000 mL | Freq: Once | INTRAVENOUS | Status: AC
  Administered 2021-08-25: 21:00:00 1000 mL via INTRAVENOUS

## 2021-08-25 MED ORDER — HYDROCORTISONE SOD SUC (PF) 100 MG IJ SOLR
100.0000 mg | Freq: Two times a day (BID) | INTRAMUSCULAR | Status: DC
  Administered 2021-08-26: 01:00:00 100 mg via INTRAVENOUS
  Filled 2021-08-25: qty 2

## 2021-08-25 MED ORDER — ALBUTEROL SULFATE (2.5 MG/3ML) 0.083% IN NEBU: 2.5000 mg | INHALATION_SOLUTION | RESPIRATORY_TRACT | Status: DC | PRN

## 2021-08-25 MED ORDER — SODIUM BICARBONATE 650 MG PO TABS
650.0000 mg | ORAL_TABLET | Freq: Three times a day (TID) | ORAL | Status: DC
  Administered 2021-08-26 – 2021-08-29 (×10): 650 mg via ORAL
  Filled 2021-08-25 (×10): qty 1

## 2021-08-25 MED ORDER — HYDROCODONE-ACETAMINOPHEN 5-325 MG PO TABS
1.0000 | ORAL_TABLET | Freq: Four times a day (QID) | ORAL | Status: DC | PRN
  Administered 2021-08-26 – 2021-08-28 (×6): 1 via ORAL
  Filled 2021-08-25 (×6): qty 1

## 2021-08-25 MED ORDER — VANCOMYCIN HCL 1250 MG/250ML IV SOLN
1250.0000 mg | Freq: Once | INTRAVENOUS | Status: AC
  Administered 2021-08-25: 21:00:00 1250 mg via INTRAVENOUS
  Filled 2021-08-25: qty 250

## 2021-08-25 MED ORDER — SODIUM CHLORIDE 0.9 % IV SOLN
2.0000 g | Freq: Two times a day (BID) | INTRAVENOUS | Status: DC
  Administered 2021-08-25 – 2021-08-26 (×2): 2 g via INTRAVENOUS
  Filled 2021-08-25 (×3): qty 2

## 2021-08-25 MED ORDER — HYDROXYZINE HCL 10 MG PO TABS
10.0000 mg | ORAL_TABLET | Freq: Every day | ORAL | Status: DC | PRN
  Administered 2021-08-27: 10 mg via ORAL
  Filled 2021-08-25 (×2): qty 1

## 2021-08-25 MED ORDER — HEPARIN SODIUM (PORCINE) 5000 UNIT/ML IJ SOLN
5000.0000 [IU] | Freq: Three times a day (TID) | INTRAMUSCULAR | Status: DC
  Administered 2021-08-26 – 2021-08-29 (×10): 5000 [IU] via SUBCUTANEOUS
  Filled 2021-08-25 (×10): qty 1

## 2021-08-25 MED ORDER — ACETAMINOPHEN 325 MG PO TABS
650.0000 mg | ORAL_TABLET | Freq: Four times a day (QID) | ORAL | Status: DC | PRN
  Administered 2021-08-26: 15:00:00 650 mg via ORAL
  Filled 2021-08-25: qty 2

## 2021-08-25 MED ORDER — GABAPENTIN 100 MG PO CAPS: 100.0000 mg | ORAL_CAPSULE | Freq: Every day | ORAL | Status: DC

## 2021-08-25 MED ORDER — GUAIFENESIN ER 600 MG PO TB12
600.0000 mg | ORAL_TABLET | Freq: Two times a day (BID) | ORAL | Status: DC
  Administered 2021-08-26 – 2021-08-29 (×8): 600 mg via ORAL
  Filled 2021-08-25 (×8): qty 1

## 2021-08-25 MED ORDER — ONDANSETRON HCL 4 MG PO TABS: 4.0000 mg | ORAL_TABLET | Freq: Four times a day (QID) | ORAL | Status: DC | PRN

## 2021-08-25 MED ORDER — IPRATROPIUM BROMIDE 0.02 % IN SOLN
0.5000 mg | Freq: Four times a day (QID) | RESPIRATORY_TRACT | Status: DC
  Administered 2021-08-26 (×2): 0.5 mg via RESPIRATORY_TRACT
  Filled 2021-08-25 (×2): qty 2.5

## 2021-08-25 MED ORDER — LACTATED RINGERS IV SOLN: INTRAVENOUS | Status: AC

## 2021-08-25 MED ORDER — LACTATED RINGERS IV BOLUS (SEPSIS)
1000.0000 mL | Freq: Once | INTRAVENOUS | Status: AC
  Administered 2021-08-25: 22:00:00 1000 mL via INTRAVENOUS

## 2021-08-25 MED ORDER — SODIUM CHLORIDE 0.9 % IV SOLN: INTRAVENOUS | Status: DC

## 2021-08-25 MED ORDER — VANCOMYCIN HCL IN DEXTROSE 1-5 GM/200ML-% IV SOLN: 1000.0000 mg | INTRAVENOUS | Status: DC

## 2021-08-25 MED ORDER — FOLIC ACID 1 MG PO TABS
1.0000 mg | ORAL_TABLET | Freq: Every day | ORAL | Status: DC
  Administered 2021-08-26 – 2021-08-29 (×4): 1 mg via ORAL
  Filled 2021-08-25 (×4): qty 1

## 2021-08-25 MED ORDER — ALBUMIN HUMAN 25 % IV SOLN
12.5000 g | Freq: Four times a day (QID) | INTRAVENOUS | Status: DC
  Administered 2021-08-26 (×2): 12.5 g via INTRAVENOUS
  Filled 2021-08-25 (×2): qty 50

## 2021-08-25 NOTE — ED Provider Notes (Signed)
Colleen Lee Provider Note   CSN: [id number] Arrival date & time: 08/26/2021  1630     History  Chief Complaint  Patient presents with   Altered Mental Status    Colleen Lee is a 66 y.o. female.  HPI Patient arrives 1 day after being seen and evaluated in the emergency department, 2 days after being discharged from this facility now with family concerns of altered mental status.  Patient cannot provide any details, level 5 caveat secondary to confusion. Chart review notable for history of chronic hep C with cirrhosis, alcohol abuse, and as above recent admission for encephalopathy.  Per report the patient requires oxygen 24/7, and on room air, EMS reports she was 84% saturation    Home Medications Prior to Admission medications   Medication Sig Start Date End Date Taking? Authorizing Provider  albuterol (VENTOLIN HFA) 108 (90 Base) MCG/ACT inhaler Inhale 2 puffs into the lungs every 6 (six) hours as needed for wheezing or shortness of breath. 07/16/21   Roxan Hockey, MD  allopurinol (ZYLOPRIM) 300 MG tablet Take 300 mg by mouth daily. 04/15/21   [provider]  amLODipine (NORVASC) 5 MG tablet Take 1 tablet (5 mg total) by mouth daily. 08/23/21   Shawna Clamp, MD  aspirin EC 81 MG EC tablet Take 1 tablet (81 mg total) by mouth daily. Swallow whole. 08/23/21   Shawna Clamp, MD  Cholecalciferol (DIALYVITE VITAMIN D3 MAX) 1.25 MG (50000 UT) TABS Take 1 tablet by mouth every 7 (seven) days.    [provider]  cyclobenzaprine (FLEXERIL) 5 MG tablet Take 5 mg by mouth 3 (three) times daily. 04/15/21   [provider]  folic acid (FOLVITE) 1 MG tablet Take 1 tablet (1 mg total) by mouth daily. 01/06/21   Barton Dubois, MD  gabapentin (NEURONTIN) 100 MG capsule Take 1 capsule (100 mg total) by mouth 3 (three) times daily. Patient taking differently: Take 100 mg by mouth daily. 12/22/18   Carole Civil, MD  HYDROcodone-acetaminophen  (NORCO/VICODIN) 5-325 MG tablet Take 1 tablet by mouth every 6 (six) hours as needed. Patient taking differently: Take 1 tablet by mouth every 6 (six) hours as needed for moderate pain. 06/08/21   Noemi Chapel, MD  hydrOXYzine (ATARAX) 10 MG tablet Take 1 tablet (10 mg total) by mouth daily as needed. Patient taking differently: Take 10 mg by mouth daily as needed for anxiety. 07/16/21   Roxan Hockey, MD  lactulose (CHRONULAC) 10 GM/15ML solution Take 30 mLs (20 g total) by mouth 2 (two) times daily as needed for mild constipation or moderate constipation. 07/16/21   Roxan Hockey, MD  megestrol (MEGACE) 40 MG tablet Take 1 tablet (40 mg total) by mouth daily. 07/16/21   Roxan Hockey, MD  metoprolol tartrate (LOPRESSOR) 50 MG tablet Take 1 tablet (50 mg total) by mouth 2 (two) times daily. 08/23/21   Shawna Clamp, MD  naloxone [facility]) nasal spray 4 mg/0.1 mL See admin instructions. ADMINISTER A SINGLE SPRAYEOF NARCAN INTO ONECNOSTRIL. REPEAT AFTER 3SMINUTES IN OTHER NOSTRIL IF NO RESPONSE. 10/03/20   [provider]  ondansetron (ZOFRAN) 4 MG tablet Take 1 tablet (4 mg total) by mouth every 6 (six) hours as needed for nausea. 07/16/21   Roxan Hockey, MD  pantoprazole (PROTONIX) 40 MG tablet Take 1 tablet (40 mg total) by mouth 2 (two) times daily. 07/16/21 07/16/22  Roxan Hockey, MD  Plecanatide (TRULANCE) 3 MG TABS Take 3 mg by mouth daily. 06/30/21 06/30/22  Rourk, Cristopher Estimable, MD  potassium chloride (MICRO-K) 10 MEQ CR capsule Take 20 mEq by mouth daily. 05/30/21   [provider]  sodium bicarbonate 650 MG tablet Take 1 tablet (650 mg total) by mouth 3 (three) times daily. 08/23/21   Shawna Clamp, MD      Allergies    Penicillins    Review of Systems   Review of Systems  Unable to perform ROS: Mental status change   Physical Exam Updated Vital Signs BP (!) 118/94    Pulse (!) 129    Temp (!) 97.5 F (36.4 C) (Oral)    Resp (!) 29    Ht 5\' 6"  (1.676 m)     Wt 60.8 kg    SpO2 97%    BMI 21.63 kg/m  Physical Exam Vitals and nursing note reviewed.  Constitutional:      Appearance: She is well-developed. She is ill-appearing.  HENT:     Head: Normocephalic and atraumatic.  Eyes:     Conjunctiva/sclera: Conjunctivae normal.  Cardiovascular:     Rate and Rhythm: Regular rhythm. Tachycardia present.  Pulmonary:     Effort: Pulmonary effort is normal. No respiratory distress.     Breath sounds: Normal breath sounds. No stridor.  Abdominal:     General: There is no distension.  Skin:    General: Skin is warm and dry.  Neurological:     Cranial Nerves: No cranial nerve deficit.     Comments: Response to stimuli, otherwise essentially withdrawn, noninteractive  Psychiatric:        Cognition and Memory: Cognition is impaired. Memory is impaired.    ED Results / Procedures / Treatments   Labs (all labs ordered are listed, but only abnormal results are displayed) Labs Reviewed  URINALYSIS, ROUTINE W REFLEX MICROSCOPIC - Abnormal; Notable for the following components:      Result Value   Color, Urine AMBER (*)    APPearance HAZY (*)    Protein, ur 30 (*)    Nitrite POSITIVE (*)    Leukocytes,Ua MODERATE (*)    Bacteria, UA MANY (*)    All other components within normal limits  AMMONIA - Abnormal; Notable for the following components:   Ammonia 44 (*)    All other components within normal limits  COMPREHENSIVE METABOLIC PANEL - Abnormal; Notable for the following components:   CO2 21 (*)    Glucose, Bld 111 (*)    Creatinine, Ser 1.02 (*)    Total Protein 6.4 (*)    Albumin 1.7 (*)    AST 54 (*)    Alkaline Phosphatase 424 (*)    All other components within normal limits  LACTIC ACID, PLASMA - Abnormal; Notable for the following components:   Lactic Acid, Venous 4.6 (*)    All other components within normal limits  RAPID URINE DRUG SCREEN, HOSP PERFORMED - Abnormal; Notable for the following components:   Opiates POSITIVE (*)     Benzodiazepines POSITIVE (*)    All other components within normal limits  CBC WITH DIFFERENTIAL/PLATELET - Abnormal; Notable for the following components:   WBC 13.6 (*)    RBC 3.17 (*)    Hemoglobin 10.4 (*)    HCT 31.0 (*)    RDW 18.3 (*)    Neutro Abs 12.2 (*)    Abs Immature Granulocytes 0.11 (*)    All other components within normal limits  LACTIC ACID, PLASMA - Abnormal; Notable for the following components:   Lactic Acid, Venous  4.5 (*)    All other components within normal limits  CULTURE, BLOOD (SINGLE)  RESP PANEL BY RT-PCR (FLU A&B, COVID) ARPGX2  ETHANOL  APTT  LACTIC ACID, PLASMA    EKG None  Radiology CT HEAD WO CONTRAST  Result Date: 08/19/2021 CLINICAL DATA:  Mental status change, unknown cause EXAM: CT HEAD WITHOUT CONTRAST TECHNIQUE: Contiguous axial images were obtained from the base of the skull through the vertex without intravenous contrast. RADIATION DOSE REDUCTION: This exam was performed according to the departmental dose-optimization program which includes automated exposure control, adjustment of the mA and/or kV according to patient size and/or use of iterative reconstruction technique. COMPARISON:  08/17/2021 FINDINGS: Brain: There is atrophy and chronic small vessel disease changes. No acute intracranial abnormality. Specifically, no hemorrhage, hydrocephalus, mass lesion, acute infarction, or significant intracranial injury. Vascular: No hyperdense vessel or unexpected calcification. Skull: No acute calvarial abnormality. Sinuses/Orbits: No acute findings Other: None IMPRESSION: Atrophy, chronic microvascular disease. No acute intracranial abnormality. Electronically Signed   By: Rolm Baptise M.D.   On: 08/30/2021 18:45   DG Chest Port 1 View  Result Date: 08/28/2021 CLINICAL DATA:  Altered mental status EXAM: PORTABLE CHEST 1 VIEW COMPARISON:  08/16/2021, CT 09/27/2020 FINDINGS: Interval development of diffuse ground-glass and interstitial opacity  throughout left thorax with patchy airspace disease in the right lower lung. No pleural effusion. Normal cardiomediastinal silhouette. No pneumothorax. IMPRESSION: Interval development of patchy right lower lung airspace disease and diffuse interstitial and ground-glass opacity throughout left thorax, suspect that findings are second bilateral infection/pneumonia. Electronically Signed   By: Donavan Foil M.D.   On: 08/09/2021 17:25    Procedures Procedures    Medications Ordered in ED Medications  0.9 %  sodium chloride infusion (0 mLs Intravenous Stopped 08/11/2021 1812)  lactated ringers infusion (has no administration in time range)  lactated ringers bolus 1,000 mL (1,000 mLs Intravenous New Bag/Given 08/05/2021 2055)    And  lactated ringers bolus 1,000 mL (has no administration in time range)  ceFEPIme (MAXIPIME) 2 g in sodium chloride 0.9 % 100 mL IVPB (2 g Intravenous New Bag/Given 08/14/2021 1959)  vancomycin (VANCOREADY) IVPB 1250 mg/250 mL (1,250 mg Intravenous New Bag/Given 08/28/2021 2102)    Followed by  vancomycin (VANCOCIN) IVPB 1000 mg/200 mL premix (has no administration in time range)  lactated ringers bolus 1,000 mL (has no administration in time range)   On arrival with concern for sepsis versus altered mental status secondary to hyperammonemia, stroke patient was placed on continuous cardiac monitoring, CT, x-ray, labs ordered.  Cardiac 120 sinus tach abnormal Pulse ox 97% nasal cannula abnormal  ED Course/ Medical Decision Making/ A&P  9:43 PM After 2 L fluid resuscitation patient continues to have lactic acidosis though slightly improved.  X-ray interpreted concerning for pneumonia, labs interpreted, concerning for infection urinary.  Given concern for sepsis given her tachycardia, tachypnea, though she is not hypotensive patient received fluid resuscitation and broad-spectrum antibiotics, IV per protocol.  I have discussed her case with our internal medicine colleagues for  admission to the ICU given her critical illness.                         Medical Decision Making Amount and/or Complexity of Data Reviewed Independent Historian: EMS External Data Reviewed: notes.    Details: Discharged 2 days ago, encephalopathy on admission, improved by discharge Labs: ordered. Decision-making details documented in ED Course. Radiology: ordered and independent interpretation performed. Decision-making details  documented in ED Course. ECG/medicine tests: ordered and independent interpretation performed. Decision-making details documented in ED Course.    Details: EKG sinus tach, no overt ischemia  Risk Prescription drug management. Drug therapy requiring intensive monitoring for toxicity. Decision regarding hospitalization.  Critical Care Total time providing critical care: 30-74 minutes (35)       Final Clinical Impression(s) / ED Diagnoses Final diagnoses:  Severe sepsis ([place])  Delirium     Carmin Muskrat, MD 08/07/2021 2145

## 2021-08-25 NOTE — ED Notes (Signed)
Dr aware of hard stick and IV infiltration.

## 2021-08-25 NOTE — Progress Notes (Signed)
Pharmacy Antibiotic Note  Colleen Lee is a 66 y.o. female admitted on 08/11/2021 with AMS and fever 100.7.  Pt recently discharged from [facility] to SNF for rehab. Pharmacy has been consulted for Vancomycin and Cefepime dosing for UTI vs PNA.  Plan: Cefepime 2gm IV q12h Vancomycin 1250mg  IV x 1, then Vancomycin 1gm IV q24h eAUC 490.7, SCr used 1.02, Vd 0.72   Height: 5\' 6"  (167.6 cm) Weight: 60.8 kg (134 lb 0.6 oz) IBW/kg (Calculated) : 59.3  Temp (24hrs), Avg:97.5 F (36.4 C), Min:97.5 F (36.4 C), Max:97.5 F (36.4 C)  Recent Labs  Lab 08/20/21 0538 08/20/21 1310 08/21/21 0451 08/22/21 0515 08/23/21 0543 08/10/2021 1752  WBC 10.8*  --  9.7 11.7* 13.1* 13.6*  CREATININE 1.43* 1.38* 1.18* 1.01* 0.85 1.02*  LATICACIDVEN  --   --   --   --   --  4.6*    Estimated Creatinine Clearance: 50.8 mL/min (A) (by C-G formula based on SCr of 1.02 mg/dL (H)).    Allergies  Allergen Reactions   Penicillins Rash    has tolerated ceftriaxone in the past    Antimicrobials this admission: Vanc 1/24 >>  Cefepime  1/24 >>   Dose adjustments this admission:   Microbiology results: 1/24 BCx: pending 1/24 UCx: pending   Thank you for allowing pharmacy to be a part of this patients care.  Elmer Ramp 08/22/2021 7:03 PM

## 2021-08-25 NOTE — ED Triage Notes (Signed)
Pt arrived [facility] with AMS. MOST form with pt but still Full Code. Sister requested pt to go to hospital because she was not responding . C/O cant keep her eyes open, can't move, shivering. And temp 100.7 ax. Pt on 3 lpm

## 2021-08-26 ENCOUNTER — Encounter (HOSPITAL_COMMUNITY): Payer: Self-pay | Admitting: Family Medicine

## 2021-08-26 DIAGNOSIS — K7030 Alcoholic cirrhosis of liver without ascites: Secondary | ICD-10-CM | POA: Diagnosis not present

## 2021-08-26 DIAGNOSIS — R6520 Severe sepsis without septic shock: Secondary | ICD-10-CM

## 2021-08-26 DIAGNOSIS — G9340 Encephalopathy, unspecified: Secondary | ICD-10-CM

## 2021-08-26 DIAGNOSIS — K7460 Unspecified cirrhosis of liver: Secondary | ICD-10-CM

## 2021-08-26 DIAGNOSIS — F1011 Alcohol abuse, in remission: Secondary | ICD-10-CM

## 2021-08-26 DIAGNOSIS — J9620 Acute and chronic respiratory failure, unspecified whether with hypoxia or hypercapnia: Secondary | ICD-10-CM | POA: Diagnosis present

## 2021-08-26 DIAGNOSIS — A419 Sepsis, unspecified organism: Secondary | ICD-10-CM

## 2021-08-26 DIAGNOSIS — G9341 Metabolic encephalopathy: Secondary | ICD-10-CM | POA: Diagnosis not present

## 2021-08-26 DIAGNOSIS — J189 Pneumonia, unspecified organism: Secondary | ICD-10-CM | POA: Diagnosis not present

## 2021-08-26 DIAGNOSIS — E8809 Other disorders of plasma-protein metabolism, not elsewhere classified: Secondary | ICD-10-CM

## 2021-08-26 DIAGNOSIS — J9621 Acute and chronic respiratory failure with hypoxia: Secondary | ICD-10-CM | POA: Diagnosis not present

## 2021-08-26 DIAGNOSIS — J690 Pneumonitis due to inhalation of food and vomit: Secondary | ICD-10-CM

## 2021-08-26 DIAGNOSIS — J181 Lobar pneumonia, unspecified organism: Secondary | ICD-10-CM

## 2021-08-26 LAB — PROTIME-INR
INR: 1.5 — ABNORMAL HIGH (ref 0.8–1.2)
Prothrombin Time: 18.2 seconds — ABNORMAL HIGH (ref 11.4–15.2)

## 2021-08-26 LAB — MAGNESIUM: Magnesium: 1.5 mg/dL — ABNORMAL LOW (ref 1.7–2.4)

## 2021-08-26 LAB — COMPREHENSIVE METABOLIC PANEL
ALT: 19 U/L (ref 0–44)
AST: 34 U/L (ref 15–41)
Albumin: 1.6 g/dL — ABNORMAL LOW (ref 3.5–5.0)
Alkaline Phosphatase: 255 U/L — ABNORMAL HIGH (ref 38–126)
Anion gap: 7 (ref 5–15)
BUN: 13 mg/dL (ref 8–23)
CO2: 20 mmol/L — ABNORMAL LOW (ref 22–32)
Calcium: 8.7 mg/dL — ABNORMAL LOW (ref 8.9–10.3)
Chloride: 112 mmol/L — ABNORMAL HIGH (ref 98–111)
Creatinine, Ser: 0.8 mg/dL (ref 0.44–1.00)
GFR, Estimated: >60 mL/min (ref >60)
Glucose, Bld: 130 mg/dL — ABNORMAL HIGH (ref 70–99)
Potassium: 3.4 mmol/L — ABNORMAL LOW (ref 3.5–5.1)
Sodium: 139 mmol/L (ref 135–145)
Total Bilirubin: 0.8 mg/dL (ref 0.3–1.2)
Total Protein: 4.8 g/dL — ABNORMAL LOW (ref 6.5–8.1)

## 2021-08-26 LAB — DIFFERENTIAL
Abs Immature Granulocytes: 0.09 10*3/uL — ABNORMAL HIGH (ref 0.00–0.07)
Basophils Absolute: 0 10*3/uL (ref 0.0–0.1)
Basophils Relative: 0 %
Eosinophils Absolute: 0 10*3/uL (ref 0.0–0.5)
Eosinophils Relative: 0 %
Immature Granulocytes: 1 %
Lymphocytes Relative: 5 %
Lymphs Abs: 0.6 10*3/uL — ABNORMAL LOW (ref 0.7–4.0)
Monocytes Absolute: 0.3 10*3/uL (ref 0.1–1.0)
Monocytes Relative: 3 %
Neutro Abs: 10.4 10*3/uL — ABNORMAL HIGH (ref 1.7–7.7)
Neutrophils Relative %: 91 %

## 2021-08-26 LAB — T4, FREE: Free T4: 0.9 ng/dL (ref 0.61–1.12)

## 2021-08-26 LAB — CBC
HCT: 23.3 % — ABNORMAL LOW (ref 36.0–46.0)
Hemoglobin: 7.5 g/dL — ABNORMAL LOW (ref 12.0–15.0)
MCH: 31.6 pg (ref 26.0–34.0)
MCHC: 32.2 g/dL (ref 30.0–36.0)
MCV: 98.3 fL (ref 80.0–100.0)
Platelets: 214 10*3/uL (ref 150–400)
RBC: 2.37 MIL/uL — ABNORMAL LOW (ref 3.87–5.11)
RDW: 18.5 % — ABNORMAL HIGH (ref 11.5–15.5)
WBC: 10.8 10*3/uL — ABNORMAL HIGH (ref 4.0–10.5)
nRBC: 0 % (ref 0.0–0.2)

## 2021-08-26 LAB — LACTIC ACID, PLASMA
Lactic Acid, Venous: 4.5 mmol/L (ref 0.5–1.9)
Lactic Acid, Venous: 3.3 mmol/L (ref 0.5–1.9)

## 2021-08-26 LAB — CORTISOL-AM, BLOOD: Cortisol - AM: >100 ug/dL — ABNORMAL HIGH (ref 6.7–22.6)

## 2021-08-26 LAB — MRSA NEXT GEN BY PCR, NASAL: MRSA by PCR Next Gen: NOT DETECTED

## 2021-08-26 LAB — VITAMIN B12: Vitamin B-12: 1698 pg/mL — ABNORMAL HIGH (ref 180–914)

## 2021-08-26 LAB — PROCALCITONIN: Procalcitonin: 5.33 ng/mL

## 2021-08-26 LAB — TSH: TSH: 1.183 u[IU]/mL (ref 0.350–4.500)

## 2021-08-26 MED ORDER — IPRATROPIUM-ALBUTEROL 0.5-2.5 (3) MG/3ML IN SOLN: 3.0000 mL | Freq: Three times a day (TID) | RESPIRATORY_TRACT | Status: DC

## 2021-08-26 MED ORDER — ENSURE ENLIVE PO LIQD
237.0000 mL | Freq: Two times a day (BID) | ORAL | Status: DC
  Administered 2021-08-26 – 2021-08-28 (×6): 237 mL via ORAL

## 2021-08-26 MED ORDER — THIAMINE HCL 100 MG/ML IJ SOLN
100.0000 mg | Freq: Every day | INTRAMUSCULAR | Status: DC
  Administered 2021-08-26 – 2021-08-29 (×4): 100 mg via INTRAVENOUS
  Filled 2021-08-26 (×4): qty 2

## 2021-08-26 MED ORDER — POTASSIUM CHLORIDE 10 MEQ/100ML IV SOLN
10.0000 meq | INTRAVENOUS | Status: AC
  Administered 2021-08-26 (×3): 10 meq via INTRAVENOUS
  Filled 2021-08-26 (×2): qty 100

## 2021-08-26 MED ORDER — IPRATROPIUM-ALBUTEROL 0.5-2.5 (3) MG/3ML IN SOLN
3.0000 mL | Freq: Three times a day (TID) | RESPIRATORY_TRACT | Status: DC
  Administered 2021-08-26 – 2021-08-29 (×10): 3 mL via RESPIRATORY_TRACT
  Filled 2021-08-26 (×10): qty 3

## 2021-08-26 MED ORDER — SODIUM CHLORIDE 0.9 % IV SOLN
2.0000 g | Freq: Three times a day (TID) | INTRAVENOUS | Status: DC
  Administered 2021-08-26 – 2021-08-29 (×9): 2 g via INTRAVENOUS
  Filled 2021-08-26 (×9): qty 2

## 2021-08-26 MED ORDER — CHLORHEXIDINE GLUCONATE CLOTH 2 % EX PADS
6.0000 | MEDICATED_PAD | Freq: Every day | CUTANEOUS | Status: DC
  Administered 2021-08-26 – 2021-08-28 (×3): 6 via TOPICAL

## 2021-08-26 MED ORDER — MAGNESIUM SULFATE 2 GM/50ML IV SOLN
2.0000 g | Freq: Once | INTRAVENOUS | Status: AC
  Administered 2021-08-26: 10:00:00 2 g via INTRAVENOUS
  Filled 2021-08-26: qty 50

## 2021-08-26 NOTE — NC FL2 (Signed)
[place] LEVEL OF CARE SCREENING TOOL     IDENTIFICATION  Patient Name: Colleen Lee Birthdate: [birth year] Sex: female Admission Date (Current Location): 08/24/2021  [facility] and [place] Number:  [employer] and Address:  [place] [address], [place]      Provider Number: [id number]  Attending Physician Name and Address:  Orson Eva, MD  Relative Name and Phone Number:       Current Level of Care: Hospital Recommended Level of Care: [place] Prior Approval Number:    Date Approved/Denied:   PASRR Number:    Discharge Plan: SNF    Current Diagnoses: Patient Active Problem List   Diagnosis Date Noted   History of alcohol abuse 08/26/2021   Acute on chronic respiratory failure ([place]) 08/26/2021   Hypoalbuminemia 08/26/2021   HCAP (healthcare-associated pneumonia) 08/26/2021   Sepsis due to undetermined organism ([place]) 08/26/2021   Lobar pneumonia ([place]) 08/26/2021   Aspiration pneumonitis ([place]) 08/26/2021   Sepsis ([place]) 08/24/2021   Protein-calorie malnutrition, severe 08/15/2021   Pressure injury of skin 08/15/2021   Hyperkalemia 08/14/2021   Failure to thrive (child) 08/14/2021   Hypomagnesemia 08/14/2021   Hypophosphatemia 08/14/2021   Elevated troponin 08/14/2021   Failure to thrive in adult 08/14/2021   Encephalopathy    Portal hypertension ([place]) 07/15/2021   SIRS (systemic inflammatory response syndrome) ([place]) 07/14/2021   RUQ abdominal pain    Macrocytic anemia    Intractable vomiting 69/48/5462   Acute metabolic encephalopathy 70/35/0093   Weakness    Malaise    Pancreatitis, recurrent 05/17/2021   Dysuria 09/16/2020   Body mass index (BMI) 31.0-31.9, adult 01/07/2020   Osteoarthritis of spine with radiculopathy, 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   Acute pancreatitis 10/17/2019   ETOH abuse 10/17/2019   S/P right rotator cuff repair 01/30/19 02/06/2019   Nontraumatic complete tear of  right rotator cuff    Arthritis of right acromioclavicular joint    S/P total knee replacement, 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/24/2018   Shoulder impingement, right 05/24/2018   Dehydration    Intractable cyclical vomiting 81/82/9937   Pyloric stenosis in adult    Thrombocytopenia ([place]) 12/20/2017   Leukocytosis    Malnutrition of moderate degree 12/17/2017   Acute renal failure (ARF) ([place]) 12/15/2017   Chronic abdominal pain 12/15/2017   Gastroenteritis 06/14/2016   Nausea with vomiting 06/10/2016   Uterine enlargement 06/09/2016   Intractable nausea and vomiting 06/08/2016   Acute infective gastroenteritis 06/08/2016   Diarrhea 06/08/2016   Essential hypertension 06/08/2016   GERD (gastroesophageal reflux disease) 06/08/2016   Abdominal pain    Endometrial polyp 04/15/2016   PMB (postmenopausal bleeding) 16/96/7893   Alcoholic cirrhosis of liver without ascites ([place])    Asthma 01/21/2016   Hepatic cirrhosis ([place]) 09/22/2015   Arthritis of knee, degenerative 81/08/7508   History of Helicobacter pylori infection 10/30/2014   Lumbago with sciatica 07/01/2014   Chronic [name] with cirrhosis ([place]) 04/11/2014   De Quervain's disease (radial styloid tenosynovitis) 12/25/2013   Anorexia 11/21/2012   FH: colon cancer 11/21/2012   Early satiety 10/25/2012   Bowel habit changes 10/25/2012   Abdominal pain, epigastric 10/25/2012   Abdominal bloating 10/25/2012   Constipation 10/25/2012   Abnormal weight loss 10/25/2012   Chronic viral [name] ([place]) 10/25/2012   Radicular pain of left lower extremity 09/28/2012   Back pain 09/28/2012   Sciatica 08/10/2011   S/P arthroscopy of left knee 08/10/2011   Tibial plateau fracture  08/10/2011   Pain in joint, lower leg 02/12/2011   Stiffness of joint, not elsewhere classified, lower leg 02/12/2011   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   Meniscus, medial, derangement 12/29/2010   CLOSED FRACTURE OF UPPER END OF TIBIA 08/12/2010    Orientation  RESPIRATION BLADDER Height & Weight     Self, Situation  Normal Incontinent Weight: 134 lb 0.6 oz (60.8 kg) Height:  5\' 6"  (167.6 cm)  BEHAVIORAL SYMPTOMS/MOOD NEUROLOGICAL BOWEL NUTRITION STATUS      Incontinent Diet (see dc summary)  AMBULATORY STATUS COMMUNICATION OF NEEDS Skin   Extensive Assist Verbally PU Stage and Appropriate Care (sacrum stage 4, L heel pressure injury)   PU Stage 2 Dressing: Daily   PU Stage 4 Dressing: Daily               Personal Care Assistance Level of Assistance    Bathing Assistance: Maximum assistance Feeding assistance: Limited assistance Dressing Assistance: Maximum assistance     Functional Limitations Info    Sight Info: Adequate Hearing Info: Adequate Speech Info: Adequate    SPECIAL CARE FACTORS FREQUENCY  PT (By licensed PT), OT (By licensed OT)     PT Frequency: 5x week OT Frequency: 3x week            Contractures Contractures Info: Not present    Additional Factors Info    Code Status Info: Full Allergies Info: Penicillins           Current Medications (08/26/2021):  This is the current hospital active medication list Current Facility-Administered Medications  Medication Dose Route Frequency Provider Last Rate Last Admin   acetaminophen (TYLENOL) tablet 650 mg  650 mg Oral Q6H PRN Zierle-Ghosh, Asia B, DO       Or   acetaminophen (TYLENOL) suppository 650 mg  650 mg Rectal Q6H PRN Zierle-Ghosh, Asia B, DO       albuterol (PROVENTIL) (2.5 MG/3ML) 0.083% nebulizer solution 2.5 mg  2.5 mg Nebulization Q2H PRN Zierle-Ghosh, Asia B, DO       aspirin EC tablet 81 mg  81 mg Oral Daily Zierle-Ghosh, Asia B, DO   81 mg at 08/26/21 0944   ceFEPIme (MAXIPIME) 2 g in sodium chloride 0.9 % 100 mL IVPB  2 g Intravenous Q8H Tat, Shanon Brow, MD       Chlorhexidine Gluconate Cloth 2 % PADS 6 each  6 each Topical Daily Zierle-Ghosh, Asia B, DO   6 each at 08/26/21 1229   feeding supplement (ENSURE ENLIVE / ENSURE PLUS) liquid 237 mL  237  mL Oral BID BM Zierle-Ghosh, Asia B, DO   237 mL at 68/34/19 6222   folic acid (FOLVITE) tablet 1 mg  1 mg Oral Daily Zierle-Ghosh, Asia B, DO   1 mg at 08/26/21 0945   guaiFENesin (MUCINEX) 12 hr tablet 600 mg  600 mg Oral BID Zierle-Ghosh, Asia B, DO   600 mg at 08/26/21 0945   heparin injection 5,000 Units  5,000 Units Subcutaneous Q8H Zierle-Ghosh, Asia B, DO   5,000 Units at 08/26/21 9798   HYDROcodone-acetaminophen (NORCO/VICODIN) 5-325 MG per tablet 1 tablet  1 tablet Oral Q6H PRN Zierle-Ghosh, Asia B, DO   1 tablet at 08/26/21 1232   hydrOXYzine (ATARAX) tablet 10 mg  10 mg Oral Daily PRN Zierle-Ghosh, Asia B, DO       ipratropium-albuterol (DUONEB) 0.5-2.5 (3) MG/3ML nebulizer solution 3 mL  3 mL Nebulization TID Tat, Shanon Brow, MD   3 mL at 08/26/21 1311   lactated  ringers infusion   Intravenous Continuous Zierle-Ghosh, Asia B, DO 150 mL/hr at 08/21/2021 2237 New Bag at 08/13/2021 2237   metoprolol tartrate (LOPRESSOR) tablet 50 mg  50 mg Oral BID Zierle-Ghosh, Asia B, DO   50 mg at 08/26/21 0946   ondansetron (ZOFRAN) tablet 4 mg  4 mg Oral Q6H PRN Zierle-Ghosh, Asia B, DO       Or   ondansetron (ZOFRAN) injection 4 mg  4 mg Intravenous Q6H PRN Zierle-Ghosh, Asia B, DO       pantoprazole (PROTONIX) EC tablet 40 mg  40 mg Oral BID Zierle-Ghosh, Asia B, DO   40 mg at 08/26/21 0945   sodium bicarbonate tablet 650 mg  650 mg Oral TID Zierle-Ghosh, Asia B, DO   650 mg at 08/26/21 0946   thiamine (B-1) injection 100 mg  100 mg Intravenous Daily Zierle-Ghosh, Asia B, DO   100 mg at 08/26/21 5449     Discharge Medications: Please see discharge summary for a list of discharge medications.  Relevant Imaging Results:  Relevant Lab Results:   Additional Information    Shade Flood, LCSW

## 2021-08-26 NOTE — Progress Notes (Signed)
Pharmacy Antibiotic Note  Colleen Lee is a 66 y.o. female admitted on 08/10/2021 with AMS and fever 100.7.  Pt recently discharged from [facility] to SNF for rehab. Pharmacy has been consulted for Cefepime dosing for UTI vs PNA. Renal fxn improved, MRSA PCR is negative  Plan: Change Cefepime 2gm IV q8h F/u cxs and clinical progress Monitor V/S , labs  Height: 5\' 6"  (167.6 cm) Weight: 60.8 kg (134 lb 0.6 oz) IBW/kg (Calculated) : 59.3  Temp (24hrs), Avg:97.6 F (36.4 C), Min:97.5 F (36.4 C), Max:97.6 F (36.4 C)  Recent Labs  Lab 08/21/21 0451 08/22/21 0515 08/23/21 0543 08/20/2021 1752 08/20/2021 2040 08/30/2021 2240 08/26/21 0040 08/26/21 0406  WBC 9.7 11.7* 13.1* 13.6*  --   --   --  10.8*  CREATININE 1.18* 1.01* 0.85 1.02*  --   --   --  0.80  LATICACIDVEN  --   --   --  4.6* 4.5* 4.7* 4.5* 3.3*     Estimated Creatinine Clearance: 64.8 mL/min (by C-G formula based on SCr of 0.8 mg/dL).    Allergies  Allergen Reactions   Penicillins Rash    has tolerated ceftriaxone in the past    Antimicrobials this admission: Vanc 1/24 >> 1/25 Cefepime  1/24 >>   Microbiology results: 1/24 BCx: pending 1/24 UCx: pending   Thank you for allowing pharmacy to be a part of this patients care.  Isac Sarna, BS Pharm D, [place] Clinical Pharmacist Pager [phone]  08/26/2021 10:22 AM

## 2021-08-26 NOTE — Consult Note (Addendum)
[place] Nurse Consult Note: Reason for Consult: Consult requested for sacrum wound. Performed remotely after review of progress notes and bedside nurse assistance for the remote camera to assess the wound appearance. Measurements obtained from wound care flow-sheet. Wound type:Sacrum with chronic Stage 4 pressure injury; 4X2X3cm with 1 cm undermining, approx 40% red, 30% yellow, 30% exposed white bone. Mod amt tan drainage Pressure Injury POA: Yes Dressing procedure/placement/frequency: Pt is critically ill in ICU with multiple systemic factors which can impair healing. They are on a low airloss mattress to reduce pressure.   Topical treatment orders provided for bedside nurses to perform as follows to absorb drainage and provide antimicrobial benifits: Apply a piece of Aquacel to sacrum wound Q day Kellie Simmering # [phone]) using swab to fill, then cover with foam dressing.  (Change foam dressing Q 3 days or PRN soiling.) Please re-consult if further assistance is needed.  Thank-you,  Julien Girt MSN, [place], [place], [place], [place]

## 2021-08-26 NOTE — Progress Notes (Addendum)
PROGRESS NOTE  Colleen Lee YQM:[id number] DOB: 01-Oct-1955 DOA: 08/09/2021 PCP: Sandi Mariscal, MD  Brief History:  66 year old female with a history of alcoholic liver cirrhosis, fibromyalgia, hypertension, [name], anemia of chronic disease, vitamin deficiency presenting with altered mental status.  The patient was unable to provide any history, but there was some type of handwritten note accompanying the patient stating that the patient has been shivering with a fever up to 100.7 F and confusion. Notably, the patient had a prolonged hospitalization from 08/13/2021 to 08/23/2021.  During that hospitalization, the patient was treated for acute metabolic encephalopathy which is likely multifactorial including failure to thrive and numerous electrolyte and vitamin and mineral deficiencies.  She was also noted to have an incidental subacute stroke on MRI of the brain.  She was seen by neurology and recommended continuing aspirin 81 mg daily.  EEG was negative.  During the hospitalization, the patient received 1 unit PRBC without any evidence of ongoing blood loss.  PT recommended skilled nursing facility, but the patient was ultimately discharged home. In the ED, the patient was afebrile and tachycardic up to 130s.  He was hemodynamically stable.  Oxygen saturation was 97-100% on 3 L.  BMP shows sodium 139, potassium 3.4, bicarbonate 20, BUN 13, creatinine 0.0.  Ammonia 44, AST 34, ALT 19, alk phosphatase 255, total bilirubin 0.8.  Chest x-ray showed patchy right lower lobe airspace disease with diffuse interstitial GGO in the left thorax.  Patient was started on vancomycin and cefepime and lactated Ringer's.  Assessment/Plan: Severe sepsis -Secondary to pneumonia and UTI -Presented with elevated lactate, tachycardia, leukocytosis -Continue vancomycin and cefepime pending culture data -Follow blood culture -Follow urine culture -Lactic acid peaked 4.6 -Check PCT-5.33  Lobar  pneumonia -Suspect a degree of aspiration -MRSA screen -Continue empiric vancomycin and cefepime for now  Acute respiratory failure with hypoxia -Secondary to pneumonia -COVID-19 PCR negative -Stable on 3 L -Wean oxygen as tolerated  Pyuria -UA 11-20 WBC -Follow urine culture -Continue empiric antibiotics  Acute metabolic encephalopathy -Multifactorial including infectious process, vitamin and mineral deficiencies, dehydration -08/18/2021 RPR negative -TSH -only A&O x 1 on 5/28  Alcoholic liver cirrhosis -INR 1.5  Hyperammonemia -Unclear clinical significance presently -Recheck ammonia -Discontinue lactulose -If metabolic encephalopathy persists, and ammonia remains elevated, would pursue treatment at that point  Anemia of chronic disease -11/13/2438 folic acid 5.5 -08/04/7251 B12 1658 -08/17/2028 thiamine 183.3 -Check iron studies -Transfuse for hemoglobin less than 7 -Continue folic acid and thiamine supplementation  Hypomagnesemia -Replete  Hypokalemia -Repleted           Family Communication:  no Family at bedside  Consultants:  none  Code Status:  FULL  DVT Prophylaxis:  [place] Heparin   Procedures: As Listed in Progress Note Above  Antibiotics: Vanc 1/24>> Cefepime 1/25>>   The patient is critically ill with multiple organ systems failure and requires high complexity decision making for assessment and support, frequent evaluation and titration of therapies, application of advanced monitoring technologies and extensive interpretation of multiple databases.  Critical care time - 35 mins.     Subjective: Patient denies fevers, chills, headache, chest pain, dyspnea, nausea, vomiting, diarrhea, abdominal pain,   Objective: Vitals:   08/26/21 0313 08/26/21 0400 08/26/21 0500 08/26/21 0734  BP:  126/87 139/89   Pulse:  85 84   Resp:  (!) 23 20   Temp:  97.6 F (36.4 C)    TempSrc:  Oral  SpO2: 100% 100% 97% 99%  Weight:      Height:         Intake/Output Summary (Last 24 hours) at 08/26/2021 0758 Last data filed at 08/26/2021 0720 Gross per 24 hour  Intake 315.96 ml  Output 550 ml  Net -234.04 ml   Weight change:  Exam:  General:  Pt is alert, follows commands appropriately, not in acute distress HEENT: No icterus, No thrush, No neck mass, [place]/AT Cardiovascular: RRR, S1/S2, no rubs, no gallops Respiratory: bilateral rales.  No wheeze Abdomen: Soft/+BS, non tender, non distended, no guarding Extremities: No edema, No lymphangitis, No petechiae, No rashes, no synovitis   Data Reviewed: I have personally reviewed following labs and imaging studies Basic Metabolic Panel: Recent Labs  Lab 08/20/21 0538 08/20/21 1310 08/21/21 0451 08/22/21 0515 08/23/21 0543 08/07/2021 1752 08/26/21 0406  NA 139   < > 138 135 138 142 139  K 2.0*   < > 3.8 3.3* 3.9 4.1 3.4*  CL 106   < > 111 110 111 108 112*  CO2 19*   < > 18* 16* 18* 21* 20*  GLUCOSE 112*   < > 82 93 92 111* 130*  BUN 8   < > $R'9 8 10 17 13  'Kl$ CREATININE 1.43*   < > 1.18* 1.01* 0.85 1.02* 0.80  CALCIUM 7.0*   < > 7.2* 7.7* 8.8* 9.8 8.7*  MG 1.1*  --  1.6* 1.6* 1.9  --  1.5*  PHOS 6.1*  --  2.4* 2.2* 2.4*  --   --    < > = values in this interval not displayed.   Liver Function Tests: Recent Labs  Lab 08/03/2021 1752 08/26/21 0406  AST 54* 34  ALT 25 19  ALKPHOS 424* 255*  BILITOT 1.2 0.8  PROT 6.4* 4.8*  ALBUMIN 1.7* 1.6*   No results for input(s): LIPASE, AMYLASE in the last 168 hours. Recent Labs  Lab 08/17/2021 1752  AMMONIA 44*   Coagulation Profile: Recent Labs  Lab 08/26/21 0406  INR 1.5*   CBC: Recent Labs  Lab 08/21/21 0451 08/21/21 1900 08/22/21 0515 08/23/21 0543 08/09/2021 1752 08/26/21 0406  WBC 9.7  --  11.7* 13.1* 13.6* 10.8*  NEUTROABS  --   --   --   --  12.2* 10.4*  HGB 6.6* 9.8* 9.3* 9.9* 10.4* 7.5*  HCT 20.2* 29.3* 26.4* 28.9* 31.0* 23.3*  MCV 98.5  --  94.6 95.4 97.8 98.3  PLT 222  --  204 244 287 214   Cardiac  Enzymes: No results for input(s): CKTOTAL, CKMB, CKMBINDEX, TROPONINI in the last 168 hours. BNP: Invalid input(s): POCBNP CBG: No results for input(s): GLUCAP in the last 168 hours. HbA1C: No results for input(s): HGBA1C in the last 72 hours. Urine analysis:    Component Value Date/Time   COLORURINE AMBER (A) 08/21/2021 1721   APPEARANCEUR HAZY (A) 08/13/2021 1721   LABSPEC 1.016 08/11/2021 1721   PHURINE 5.0 08/28/2021 1721   GLUCOSEU NEGATIVE 08/11/2021 1721   HGBUR NEGATIVE 08/11/2021 1721   BILIRUBINUR NEGATIVE 08/20/2021 1721   KETONESUR NEGATIVE 08/19/2021 1721   PROTEINUR 30 (A) 08/03/2021 1721   UROBILINOGEN 0.2 04/01/2015 1140   NITRITE POSITIVE (A) 08/15/2021 1721   LEUKOCYTESUR MODERATE (A) 08/13/2021 1721   Sepsis Labs: $[email or web address]'@[email or web address]'[id number]$ (procalcitonin:4,lacticidven:4) ) Recent Results (from the past 240 hour(s))  Culture, blood (single)     Status: None (Preliminary result)   Collection Time: 08/22/2021  5:52 PM   Specimen: Vein; Blood  Result Value Ref Range Status   Specimen Description BLOOD LEFT ARM  Final   Special Requests   Final    BOTTLES DRAWN AEROBIC AND ANAEROBIC Blood Culture adequate volume Performed at [facility], [address] [place], [place] [address]    Culture PENDING  Incomplete   Report Status PENDING  Incomplete  Resp Panel by RT-PCR (Flu A&B, Covid) Nasopharyngeal Swab     Status: None   Collection Time: 08/27/2021  7:14 PM   Specimen: Nasopharyngeal Swab; Nasopharyngeal(NP) swabs in vial transport medium  Result Value Ref Range Status   SARS Coronavirus 2 by RT PCR NEGATIVE NEGATIVE Final    Comment: (NOTE) SARS-CoV-2 target nucleic acids are NOT DETECTED.  The SARS-CoV-2 RNA is generally detectable in upper respiratory specimens during the acute phase of infection. The lowest concentration of SARS-CoV-2 viral copies this assay can detect is 138 copies/mL. A negative result does not preclude SARS-Cov-2 infection and should not be used  as the sole basis for treatment or other patient management decisions. A negative result may occur with  improper specimen collection/handling, submission of specimen other than nasopharyngeal swab, presence of viral mutation(s) within the areas targeted by this assay, and inadequate number of viral copies(<138 copies/mL). A negative result must be combined with clinical observations, patient history, and epidemiological information. The expected result is Negative.  Fact Sheet for Patients:  EntrepreneurPulse.com.au  Fact Sheet for Healthcare Providers:  IncredibleEmployment.be  This test is no t yet approved or cleared by the [place] FDA and  has been authorized for detection and/or diagnosis of SARS-CoV-2 by FDA under an Emergency Use Authorization (EUA). This EUA will remain  in effect (meaning this test can be used) for the duration of the COVID-19 declaration under Section 564(b)(1) of the Act, 21 U.S.C.section 360bbb-3(b)(1), unless the authorization is terminated  or revoked sooner.       Influenza A by PCR NEGATIVE NEGATIVE Final   Influenza B by PCR NEGATIVE NEGATIVE Final    Comment: (NOTE) The Xpert Xpress SARS-CoV-2/FLU/RSV plus assay is intended as an aid in the diagnosis of influenza from Nasopharyngeal swab specimens and should not be used as a sole basis for treatment. Nasal washings and aspirates are unacceptable for Xpert Xpress SARS-CoV-2/FLU/RSV testing.  Fact Sheet for Patients: EntrepreneurPulse.com.au  Fact Sheet for Healthcare Providers: IncredibleEmployment.be  This test is not yet approved or cleared by the [place] FDA and has been authorized for detection and/or diagnosis of SARS-CoV-2 by FDA under an Emergency Use Authorization (EUA). This EUA will remain in effect (meaning this test can be used) for the duration of the COVID-19 declaration under Section 564(b)(1)  of the Act, 21 U.S.C. section 360bbb-3(b)(1), unless the authorization is terminated or revoked.  Performed at [facility], [address] [place], [place] [id number]      Scheduled Meds:  amLODipine  5 mg Oral Daily   aspirin EC  81 mg Oral Daily   Chlorhexidine Gluconate Cloth  6 each Topical Daily   feeding supplement  237 mL Oral BID BM   folic acid  1 mg Oral Daily   gabapentin  100 mg Oral Daily   guaiFENesin  600 mg Oral BID   heparin  5,000 Units Subcutaneous Q8H   hydrocortisone sod succinate (SOLU-CORTEF) inj  100 mg Intravenous Q12H   ipratropium  0.5 mg Nebulization Q6H   lactulose  300 mL Rectal Daily   megestrol  40 mg Oral Daily   metoprolol tartrate  50  mg Oral BID   pantoprazole  40 mg Oral BID   sodium bicarbonate  650 mg Oral TID   thiamine injection  100 mg Intravenous Daily   Continuous Infusions:  albumin human Stopped (08/26/21 0654)   ceFEPime (MAXIPIME) IV 2 g (08/26/21 0720)   lactated ringers 150 mL/hr at 08/20/2021 2237   vancomycin      Procedures/Studies: CT ANGIO HEAD NECK W WO CM  Result Date: 08/17/2021 CLINICAL DATA:  Stroke/TIA, determine embolic source. Right frontoparietal and cerebellar infarcts as well as abnormal appearance of the right vertebral artery on MRI. EXAM: CT ANGIOGRAPHY HEAD AND NECK TECHNIQUE: Multidetector CT imaging of the head and neck was performed using the standard protocol during bolus administration of intravenous contrast. Multiplanar CT image reconstructions and MIPs were obtained to evaluate the vascular anatomy. Carotid stenosis measurements (when applicable) are obtained utilizing NASCET criteria, using the distal internal carotid diameter as the denominator. RADIATION DOSE REDUCTION: This exam was performed according to the departmental dose-optimization program which includes automated exposure control, adjustment of the mA and/or kV according to patient size and/or use of iterative reconstruction technique.  CONTRAST:  176mL OMNIPAQUE IOHEXOL 350 MG/ML SOLN COMPARISON:  Head MRI 08/17/2021 FINDINGS: CT HEAD FINDINGS Brain: There is a small acute infarct superiorly in the right cerebellar hemisphere as shown on MRI. The subcentimeter right frontoparietal subcortical infarct on MRI is not well seen by CT. No intracranial hemorrhage, mass, midline shift, or extra-axial fluid collection is identified. There is mild cerebral atrophy. Periventricular white matter hypodensities are nonspecific but compatible with mild chronic small vessel ischemic disease. Vascular: Calcified atherosclerosis at the skull base. Skull: No fracture or suspicious osseous lesion. Sinuses: Unchanged fluid in the right sphenoid sinus. Trace right mastoid fluid. Orbits: Bilateral cataract extraction. Review of the MIP images confirms the above findings CTA NECK FINDINGS Aortic arch: Normal variant aortic arch branching pattern with common origin of the brachiocephalic and left common carotid arteries. Widely patent arch vessel origins. Right carotid system: Patent with a small amount calcified plaque at the carotid bifurcation. No evidence of a significant stenosis or dissection. Left carotid system: Patent with minimal atherosclerotic plaque at the carotid bifurcation and possible tiny web laterally in the proximal aspect of the carotid bulb. No evidence of a significant stenosis or dissection. Vertebral arteries: The vertebral arteries are patent without evidence of a significant stenosis or dissection of the left vertebral artery which is strongly dominant. The right vertebral artery is diffusely small in caliber with a superimposed severe stenosis at its origin. Skeleton: Mild disc and moderate facet degeneration in the cervical spine. Other neck: No evidence of cervical lymphadenopathy or mass. Upper chest: Centrilobular emphysema. Partially visualized right PICC. Review of the MIP images confirms the above findings CTA HEAD FINDINGS Anterior  circulation: Internal carotid arteries are patent from skull base to carotid termini. The cavernous carotid segments are tortuous with atherosclerotic calcification greater on the left not resulting in significant stenosis. ACAs and MCAs are patent without evidence of a proximal branch occlusion or significant proximal stenosis. No aneurysm is identified. Posterior circulation: The intracranial vertebral arteries are patent to the basilar with mild nonstenotic calcified plaque on the left. There is moderate to severe stenosis of the right vertebral artery proximal to the PICA origin. Patent PICA and SCA origins are seen bilaterally. The basilar artery is widely patent and tortuous. Posterior communicating arteries are diminutive or absent. Both PCAs are patent without evidence of a significant proximal stenosis. No aneurysm  is identified. Venous sinuses: As permitted by contrast timing, patent. Anatomic variants: None. Review of the MIP images confirms the above findings IMPRESSION: 1. No large vessel occlusion. 2. 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 3. No significant stenosis of the strongly dominant left vertebral artery. 4. Minimal cervical carotid artery atherosclerosis without significant stenosis. 5. Emphysema (ICD10-J43.9). Electronically Signed   By: Logan Bores M.D.   On: 08/17/2021 14:53   CT HEAD WO CONTRAST  Result Date: 08/30/2021 CLINICAL DATA:  Mental status change, unknown cause EXAM: CT HEAD WITHOUT CONTRAST TECHNIQUE: Contiguous axial images were obtained from the base of the skull through the vertex without intravenous contrast. RADIATION DOSE REDUCTION: This exam was performed according to the departmental dose-optimization program which includes automated exposure control, adjustment of the mA and/or kV according to patient size and/or use of iterative reconstruction technique. COMPARISON:  08/17/2021 FINDINGS: Brain: There is atrophy  and chronic small vessel disease changes. No acute intracranial abnormality. Specifically, no hemorrhage, hydrocephalus, mass lesion, acute infarction, or significant intracranial injury. Vascular: No hyperdense vessel or unexpected calcification. Skull: No acute calvarial abnormality. Sinuses/Orbits: No acute findings Other: None IMPRESSION: Atrophy, chronic microvascular disease. No acute intracranial abnormality. Electronically Signed   By: Rolm Baptise M.D.   On: 08/10/2021 18:45   CT Head Wo Contrast  Result Date: 08/04/2021 CLINICAL DATA:  Provided history: Neuro deficit, acute, stroke suspected. Mental status change, unknown cause. Slurred speech. EXAM: CT HEAD WITHOUT CONTRAST TECHNIQUE: Contiguous axial images were obtained from the base of the skull through the vertex without intravenous contrast. COMPARISON:  Brain MRI 07/13/2021.  Head CT 05/13/2014. FINDINGS: Brain: Mild generalized cerebral atrophy. Mild patchy and ill-defined hypoattenuation within the cerebral white matter, nonspecific but compatible with chronic small vessel ischemic disease. There is no acute intracranial hemorrhage. No demarcated cortical infarct. No extra-axial fluid collection. No evidence of an intracranial mass. No midline shift. Vascular: No hyperdense vessel.  Atherosclerotic calcifications. Skull: Normal. Negative for fracture or focal lesion. Sinuses/Orbits: Visualized orbits show no acute finding. No significant paranasal sinus disease at the imaged levels. IMPRESSION: No evidence of acute intracranial abnormality. Mild chronic small vessel ischemic changes within the cerebral white matter. Mild generalized cerebral atrophy. Electronically Signed   By: Kellie Simmering D.O.   On: 08/04/2021 14:14   MR BRAIN WO CONTRAST  Result Date: 08/17/2021 CLINICAL DATA:  Provided history: Delirium. EXAM: MRI HEAD WITHOUT CONTRAST TECHNIQUE: Multiplanar, multiecho pulse sequences of the brain and surrounding structures were  obtained without intravenous contrast. COMPARISON:  Prior head CT examinations 08/13/2021 and earlier. Brain MRI 07/13/2021. FINDINGS: Brain: Intermittently motion degraded examination, limiting evaluation. Most notably, there is severe motion degradation of the sagittal T1 weighted sequence, severe motion degradation of the axial T1 weighted sequence and severe motion degradation of the coronal T2 TSE sequence. Mild to moderate generalized cerebral atrophy. Comparatively mild cerebellar atrophy. 5 mm acute/early subacute infarct within the left frontoparietal subcortical white matter (series 5, image 25) (series 7, image 12). 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 Chronic small vessel ischemic changes which are mild in cerebral white matter, and mild to moderate in the pons. No evidence of an intracranial mass. No chronic intracranial blood products. No extra-axial fluid collection. No midline shift. Vascular: Similar to the prior brain MRI of 07/13/2021, there is signal abnormality within the intracranial right vertebral artery suggesting high-grade stenosis  or vessel occlusion. Skull and upper cervical spine: Within described limitations, no focal suspicious marrow lesion is identified. Sinuses/Orbits: Visualized orbits show no acute finding. Large fluid level, and background mild mucosal thickening, within the right sphenoid sinus. Other: Trace fluid within the bilateral mastoid air cells. IMPRESSION: Intermittently motion degraded examination, as described and limiting evaluation. 5 mm acute/early subacute infarct within the right frontoparietal subcortical white matter. 1.8 x 0.7 cm acute infarct within the superior right cerebellar hemisphere. Background chronic small vessel ischemic changes which are mild in the cerebral white matter, and mild-to-moderate in the pons, stable from the brain MRI of 07/13/2021. Similar  to the prior MRI, there is signal abnormality within the intracranial right vertebral artery suggesting high-grade stenosis or vessel occlusion. MR or CT angiography may be obtained for further evaluation, as clinically warranted. Mild-to-moderate generalized cerebral atrophy. Comparatively mild cerebellar atrophy. Right sphenoid sinusitis. Electronically Signed   By: Kellie Simmering D.O.   On: 08/17/2021 09:35   DG Chest Port 1 View  Result Date: 08/30/2021 CLINICAL DATA:  Altered mental status EXAM: PORTABLE CHEST 1 VIEW COMPARISON:  08/16/2021, CT 09/27/2020 FINDINGS: Interval development of diffuse ground-glass and interstitial opacity throughout left thorax with patchy airspace disease in the right lower lung. No pleural effusion. Normal cardiomediastinal silhouette. No pneumothorax. IMPRESSION: Interval development of patchy right lower lung airspace disease and diffuse interstitial and ground-glass opacity throughout left thorax, suspect that findings are second bilateral infection/pneumonia. Electronically Signed   By: Donavan Foil M.D.   On: 08/05/2021 17:25   DG CHEST PORT 1 VIEW  Result Date: 08/16/2021 CLINICAL DATA:  The reason for exam is stated as dyspnea. Hx of asthma, HTN, and current smoker of 1 pack a day x 40 years. Negative covid-19 test x 3 days ago. EXAM: PORTABLE CHEST 1 VIEW COMPARISON:  Chest x-ray 07/09/2021 acidosis chest 09/27/2000 FINDINGS: Right PICC line with tip overlying the expected region of the superior cavoatrial junction. The heart and mediastinal contours are unchanged. No focal consolidation. No pulmonary edema. No pleural effusion. No pneumothorax. No acute osseous abnormality. IMPRESSION: No active disease. Electronically Signed   By: Iven Finn M.D.   On: 08/16/2021 19:46   EEG adult  Result Date: 08/17/2021 Tsosie Billing, MD     08/17/2021 10:12 PM [facility] Routine EEG Report Patient Name:    Reinaldo Berber Date of Birth:   Mar 02, 1956 Identification Number:   MRN - [id number] Date of Study:   08/17/2021 17:49:17 Duration: 23 min Indication: Encephalopathy, Technical Summary: A routine 20 channel electroencephalogram using the international 10-20 system of electrode placement was performed. Background: 5-6 Hz, Poorly formed States      Drowsy: were seen during drowsiness Abnormalities Generalized Slowing: Diffuse generalized slowing Activation Procedures Hyperventilation: Not performed Photic Stimulation: Not performed Classification: Abnormal : Diagnosis: This is abnormal EEG, the Presence of Generalized slowing is consistent with Encephalopathy Dr Tsosie Billing [facility] [phone] Case [id number]  ECHOCARDIOGRAM COMPLETE  Result Date: 08/17/2021    ECHOCARDIOGRAM REPORT   Patient Name:   LESHAWN HOUSEWORTH [facility] Date of Exam: 08/17/2021 Medical Rec #:  [id number]      Height:       66.0 in Accession #:    [id number]     Weight:       127.0 lb Date of Birth:  09/28/1955     BSA:          1.649 m Patient Age:    53 years  BP:           122/94 mmHg Patient Gender: F              HR:           101 bpm. [facility]am [facility] Procedure: 2D Echo, Cardiac Doppler and Color Doppler Indications:    Stroke  History:        Patient has prior history of Echocardiogram examinations, most                 recent 07/06/2019. Risk Factors:Hypertension. ETOH Abuse.  Sonographer:    Mikki Harbor Referring Phys: [phone] ABRAHAM FELIZ ORTIZ IMPRESSIONS  1. Left ventricular ejection fraction, by estimation, is 65 to 70%. The left ventricle has normal function. The left ventricle has no regional wall motion abnormalities. There is mild left ventricular hypertrophy. Left ventricular diastolic parameters are consistent with Grade I diastolic dysfunction (impaired relaxation).  2. Right ventricular systolic function is normal. The right ventricular size is normal. There is mildly elevated pulmonary artery systolic  pressure.  3. The mitral valve is normal in structure. No evidence of mitral valve regurgitation. No evidence of mitral stenosis.  4. The aortic valve has an indeterminant number of cusps. Aortic valve regurgitation is not visualized. No aortic stenosis is present. FINDINGS  Left Ventricle: Left ventricular ejection fraction, by estimation, is 65 to 70%. The left ventricle has normal function. The left ventricle has no regional wall motion abnormalities. The left ventricular internal cavity size was normal in size. There is  mild left ventricular hypertrophy. Left ventricular diastolic parameters are consistent with Grade I diastolic dysfunction (impaired relaxation). Normal left ventricular filling pressure. Right Ventricle: The right ventricular size is normal. No increase in right ventricular wall thickness. Right ventricular systolic function is normal. There is mildly elevated pulmonary artery systolic pressure. The tricuspid regurgitant velocity is 2.82  m/s, and with an assumed right atrial pressure of 8 mmHg, the estimated right ventricular systolic pressure is 39.8 mmHg. Left Atrium: Left atrial size was normal in size. Right Atrium: Right atrial size was normal in size. Pericardium: There is no evidence of pericardial effusion. Mitral Valve: The mitral valve is normal in structure. No evidence of mitral valve regurgitation. No evidence of mitral valve stenosis. MV peak gradient, 8.0 mmHg. The mean mitral valve gradient is 3.0 mmHg. Tricuspid Valve: The tricuspid valve is not well visualized. Tricuspid valve regurgitation is mild . No evidence of tricuspid stenosis. Aortic Valve: The aortic valve has an indeterminant number of cusps. Aortic valve regurgitation is not visualized. No aortic stenosis is present. Aortic valve mean gradient measures 3.0 mmHg. Aortic valve peak gradient measures 5.6 mmHg. Aortic valve area, by VTI measures 2.20 cm. Pulmonic Valve: The pulmonic valve was not well visualized.  Pulmonic valve regurgitation is not visualized. No evidence of pulmonic stenosis. Aorta: The aortic root is normal in size and structure. IAS/Shunts: No atrial level shunt detected by color flow Doppler.  LEFT VENTRICLE PLAX 2D LVIDd:         3.10 cm     Diastology LVIDs:         2.00 cm     LV e' medial:    5.66 cm/s LV PW:         1.20 cm     LV E/e' medial:  11.8 LV IVS:        1.30 cm     LV e' lateral:   6.85 cm/s LVOT diam:  1.80 cm     LV E/e' lateral: 9.8 LV SV:         47 LV SV Index:   29 LVOT Area:     2.54 cm  LV Volumes (MOD) LV vol d, MOD A2C: 20.3 ml LV vol d, MOD A4C: 14.2 ml LV vol s, MOD A2C: 8.2 ml LV vol s, MOD A4C: 5.8 ml LV SV MOD A2C:     12.1 ml LV SV MOD A4C:     14.2 ml LV SV MOD BP:      10.1 ml RIGHT VENTRICLE RV Basal diam:  3.20 cm RV Mid diam:    2.80 cm RV S prime:     17.30 cm/s LEFT ATRIUM             Index        RIGHT ATRIUM           Index LA diam:        3.10 cm 1.88 cm/m   RA Area:     11.50 cm LA Vol (A2C):   30.9 ml 18.74 ml/m  RA Volume:   26.20 ml  15.89 ml/m LA Vol (A4C):   17.0 ml 10.31 ml/m LA Biplane Vol: 25.1 ml 15.22 ml/m  AORTIC VALVE                    PULMONIC VALVE AV Area (Vmax):    2.48 cm     PV Vmax:       0.75 m/s AV Area (Vmean):   2.42 cm     PV Peak grad:  2.3 mmHg AV Area (VTI):     2.20 cm AV Vmax:           118.00 cm/s AV Vmean:          79.000 cm/s AV VTI:            0.214 m AV Peak Grad:      5.6 mmHg AV Mean Grad:      3.0 mmHg LVOT Vmax:         115.00 cm/s LVOT Vmean:        75.100 cm/s LVOT VTI:          0.185 m LVOT/AV VTI ratio: 0.86  AORTA Ao Root diam: 3.10 cm Ao Asc diam:  3.20 cm MITRAL VALVE                TRICUSPID VALVE MV Area (PHT): 5.97 cm     TR Peak grad:   31.8 mmHg MV Area VTI:   2.54 cm     TR Vmax:        282.00 cm/s MV Peak grad:  8.0 mmHg MV Mean grad:  3.0 mmHg     SHUNTS MV Vmax:       1.41 m/s     Systemic VTI:  0.18 m MV Vmean:      75.5 cm/s    Systemic Diam: 1.80 cm MV Decel Time: 127 msec MV E velocity:  66.80 cm/s MV A velocity: 112.00 cm/s MV E/A ratio:  0.60 Carlyle Dolly MD Electronically signed by Carlyle Dolly MD Signature Date/Time: 08/17/2021/3:31:03 PM    Final    CT HEAD CODE STROKE WO CONTRAST  Result Date: 08/13/2021 CLINICAL DATA:  Code stroke. Initial evaluation for acute stroke, left facial droop. EXAM: CT HEAD WITHOUT CONTRAST TECHNIQUE: Contiguous axial images were obtained from the base of the skull through the vertex without intravenous contrast. RADIATION  DOSE REDUCTION: This exam was performed according to the departmental dose-optimization program which includes automated exposure control, adjustment of the mA and/or kV according to patient size and/or use of iterative reconstruction technique. COMPARISON:  Prior head CT from 08/04/2021. FINDINGS: Brain: Moderately advanced cerebral atrophy with chronic small vessel ischemic disease. No acute intracranial hemorrhage. No acute large vessel territory infarct. No mass lesion, midline shift or mass effect. No hydrocephalus or extra-axial fluid collection. Vascular: No hyperdense vessel. Calcified atherosclerosis present at the skull base. Skull: Scalp soft tissues and calvarium within normal limits. Sinuses/Orbits: Globes and orbital soft tissues demonstrate no acute finding. Visualized paranasal sinuses and mastoid air cells are clear. Other: None. ASPECTS [facility] Stroke Program Early CT Score) - Ganglionic level infarction (caudate, lentiform nuclei, internal capsule, insula, M1-M3 cortex): 7 - Supraganglionic infarction (M4-M6 cortex): 3 Total score (0-10 with 10 being normal): 10 IMPRESSION: 1. No acute intracranial infarct or other abnormality. 2. ASPECTS is 10. 3. Moderately advanced cerebral atrophy with chronic small vessel ischemic disease. Results were called by telephone at the time of interpretation on 08/13/2021 at 10:52 pm to provider [facility] , who verbally acknowledged these results. Electronically Signed   By: Jeannine Boga M.D.   On: 08/13/2021 22:52   [place] EKG SITE RITE  Result Date: 08/14/2021 If Site Rite image not attached, placement could not be confirmed due to current cardiac rhythm.   Orson Eva, DO  [facility]  If 7PM-7AM, please contact night-coverage [email or web address] Password [email or web address] 08/26/2021, 7:58 AM   LOS: 1 day

## 2021-08-26 NOTE — TOC Initial Note (Signed)
Transition of Care [facility]) - Initial/Assessment Note    Patient Details  Name: Colleen Lee MRN: [id number] Date of Birth: 1956/06/11  [facility]) CM/SW Contact:    Shade Flood, LCSW Phone Number: 08/26/2021, 2:26 PM  Clinical Narrative:                  Pt admitted from [facility]. Pt is known to TOC from recent admission. Pt was discharged to [facility] for short term rehab after her previous stay. Spoke with pt's sister Tim Lair to begin dc planning process. Tim Lair states that they would like for pt to return to [facility]. Tim Lair informs this LCSW that she feels pt was discharged too soon from her previous hospital stay and that she hopes that will not be the case this time.  Updated MD on above. Will follow and assist as needed with dc planning.  Expected Discharge Plan: Skilled Nursing Facility Barriers to Discharge: Continued Medical Work up   Patient Goals and CMS Choice Patient states their goals for this hospitalization and ongoing recovery are:: go back to rehab CMS Medicare.gov Compare Post Acute Care list provided to:: Patient Represenative (must comment) Choice offered to / list presented to : Sibling  Expected Discharge Plan and Services Expected Discharge Plan: [place] In-house Referral: Clinical Social Work   Post Acute Care Choice: [place] Living arrangements for the past 2 months: [place], [place]                                      Prior Living Arrangements/Services Living arrangements for the past 2 months: [place], [place] Lives with:: Siblings Patient language and need for interpreter reviewed:: Yes Do you feel safe going back to the place where you live?: Yes      Need for Family Participation in Patient Care: Yes (Comment) Care giver support system in place?: Yes (comment)   Criminal Activity/Legal Involvement Pertinent to  Current Situation/Hospitalization: No - Comment as needed  Activities of Daily Living Home Assistive Devices/Equipment: Eyeglasses ADL Screening (condition at time of admission) [name] cognitive ability adequate to safely complete daily activities?: Yes Is the patient deaf or have difficulty hearing?: No Does the patient have difficulty seeing, even when wearing glasses/contacts?: No Does the patient have difficulty concentrating, remembering, or making decisions?: Yes Patient able to express need for assistance with ADLs?: Yes Does the patient have difficulty dressing or bathing?: Yes Independently performs ADLs?: No Communication: Independent Dressing (OT): Needs assistance Is this a change from baseline?: Pre-admission baseline Grooming: Needs assistance Is this a change from baseline?: Pre-admission baseline Feeding: Needs assistance Is this a change from baseline?: Pre-admission baseline Bathing: Needs assistance Is this a change from baseline?: Pre-admission baseline Toileting: Needs assistance Is this a change from baseline?: Pre-admission baseline In/Out Bed: Needs assistance Is this a change from baseline?: Pre-admission baseline Walks in Home: Needs assistance Is this a change from baseline?: Pre-admission baseline Does the patient have difficulty walking or climbing stairs?: Yes Weakness of Legs: Both Weakness of Arms/Hands: Both  Permission Sought/Granted                  Emotional Assessment       Orientation: : Oriented to Situation Alcohol / Substance Use: Not Applicable Psych Involvement: No (comment)  Admission diagnosis:  Delirium [R41.0] Sepsis ([place]) [A41.9]  Severe sepsis ([place]) [A41.9, R65.20] Patient Active Problem List   Diagnosis Date Noted   History of alcohol abuse 08/26/2021   Acute on chronic respiratory failure ([place]) 08/26/2021   Hypoalbuminemia 08/26/2021   HCAP (healthcare-associated pneumonia) 08/26/2021   Sepsis due to  undetermined organism ([place]) 08/26/2021   Lobar pneumonia ([place]) 08/26/2021   Aspiration pneumonitis ([place]) 08/26/2021   Sepsis ([place]) 08/24/2021   Protein-calorie malnutrition, severe 08/15/2021   Pressure injury of skin 08/15/2021   Hyperkalemia 08/14/2021   Failure to thrive (child) 08/14/2021   Hypomagnesemia 08/14/2021   Hypophosphatemia 08/14/2021   Elevated troponin 08/14/2021   Failure to thrive in adult 08/14/2021   Encephalopathy    Portal hypertension ([place]) 07/15/2021   SIRS (systemic inflammatory response syndrome) ([place]) 07/14/2021   RUQ abdominal pain    Macrocytic anemia    Intractable vomiting 24/04/7352   Acute metabolic encephalopathy 29/92/4268   Weakness    Malaise    Pancreatitis, recurrent 05/17/2021   Dysuria 09/16/2020   Body mass index (BMI) 31.0-31.9, adult 01/07/2020   Osteoarthritis of spine with radiculopathy, 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   Acute pancreatitis 10/17/2019   ETOH abuse 10/17/2019   S/P right rotator cuff repair 01/30/19 02/06/2019   Nontraumatic complete tear of right rotator cuff    Arthritis of right acromioclavicular joint    S/P total knee replacement, 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/24/2018   Shoulder impingement, right 05/24/2018   Dehydration    Intractable cyclical vomiting 34/19/6222   Pyloric stenosis in adult    Thrombocytopenia ([place]) 12/20/2017   Leukocytosis    Malnutrition of moderate degree 12/17/2017   Acute renal failure (ARF) ([place]) 12/15/2017   Chronic abdominal pain 12/15/2017   Gastroenteritis 06/14/2016   Nausea with vomiting 06/10/2016   Uterine enlargement 06/09/2016   Intractable nausea and vomiting 06/08/2016   Acute infective gastroenteritis 06/08/2016   Diarrhea 06/08/2016   Essential hypertension 06/08/2016   GERD (gastroesophageal reflux disease) 06/08/2016   Abdominal pain    Endometrial polyp 04/15/2016   PMB (postmenopausal bleeding) 97/98/9211   Alcoholic cirrhosis of liver without ascites ([place])    Asthma  01/21/2016   Hepatic cirrhosis ([place]) 09/22/2015   Arthritis of knee, degenerative 94/17/4081   History of Helicobacter pylori infection 10/30/2014   Lumbago with sciatica 07/01/2014   Chronic [name] with cirrhosis ([place]) 04/11/2014   De Quervain's disease (radial styloid tenosynovitis) 12/25/2013   Anorexia 11/21/2012   FH: colon cancer 11/21/2012   Early satiety 10/25/2012   Bowel habit changes 10/25/2012   Abdominal pain, epigastric 10/25/2012   Abdominal bloating 10/25/2012   Constipation 10/25/2012   Abnormal weight loss 10/25/2012   Chronic viral [name] ([place]) 10/25/2012   Radicular pain of left lower extremity 09/28/2012   Back pain 09/28/2012   Sciatica 08/10/2011   S/P arthroscopy of left knee 08/10/2011   Tibial plateau fracture 08/10/2011   Pain in joint, lower leg 02/12/2011   Stiffness of joint, not elsewhere classified, lower leg 02/12/2011   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   Meniscus, medial, derangement 12/29/2010   CLOSED FRACTURE OF UPPER END OF TIBIA 08/12/2010   PCP:  Sandi Mariscal, MD Pharmacy:   [facility] [place] [address] Phone: [phone] Fax: [phone]     Social Determinants of Health (SDOH) Interventions    Readmission Risk Interventions Readmission Risk Prevention Plan 08/14/2021 07/16/2021 05/19/2021  Transportation Screening Complete Complete Complete  HRI or Home Care Consult - - Complete  Social Work Scientific laboratory technician for [employer] [place] Planning/Counseling - - Complete  Palliative Care Screening - - Not Applicable  Medication Review Press photographer) Complete Complete Complete  HRI or Home Care Consult Complete Complete -  SW Recovery Care/Counseling Consult Complete Complete -  Palliative Care Screening - Not Applicable -  Skilled Nursing Facility Complete Complete -  Some recent data might be hidden

## 2021-08-26 NOTE — Progress Notes (Signed)
Initial Nutrition Assessment  DOCUMENTATION CODES:      INTERVENTION:  Recommend: consider placement of NGT and initiate enteral feeds if agreeable with patient goals of care.   RD will follow progression and make further recommendations as indicated  NUTRITION DIAGNOSIS:   Severe Malnutrition related to chronic illness (cirrhosis) as evidenced by diet history energy intake < or equal to 75% for > or equal to 1 month, percent weight loss, moderate fat depletion, moderate muscle depletion.  -Ongoing   GOAL:  Provide needs based on ASPEN/SCCM guidelines (if aligned with pt care goals)   MONITOR:  Diet advancement, tube feeding initiation if appropriate, wt trends and labs  REASON FOR ASSESSMENT:   Malnutrition Screening Tool    ASSESSMENT: Patient is a 66 yo female presents with hx of cirrhosis, acute on chronic respiratory failure, HCAP, sepsis, hypoalbuminemia, malnutrition and SIRS. Stage 4 sacral wound.   Patient with prolonged poor oral intake. Known to RD from recent admission.  Discharged 1/22 to SNF. Meets criteria for severe malnutrition. NPO currently. Recommend consider placing NGT if patient and MD concur. Intake of energy </=75% for >/=1 month and severe weight loss noted below.  Patient weight is down significantly from 76.7 kg to current 60.8 kg x 3.5 months. A loss of 15.9 kg (21%) during time period.   Intake/Output Summary (Last 24 hours) at 08/26/2021 1224 Last data filed at 08/26/2021 0720 Gross per 24 hour  Intake 315.96 ml  Output 550 ml  Net -234.04 ml    Medications reviewed and include: lactulose, folic acid, protonix, sodium bicarbonate  IVF- Lactated Ringers @ 150 ml/hr   Labs: BMP Latest Ref Rng & Units 08/26/2021 08/30/2021 08/23/2021  Glucose 70 - 99 mg/dL 130(H) 111(H) 92  BUN 8 - 23 mg/dL 13 17 10   Creatinine 0.44 - 1.00 mg/dL 0.80 1.02(H) 0.85  BUN/Creat Ratio 6 - 22 (calc) - - -  Sodium 135 - 145 mmol/L 139 142 138  Potassium 3.5 - 5.1  mmol/L 3.4(L) 4.1 3.9  Chloride 98 - 111 mmol/L 112(H) 108 111  CO2 22 - 32 mmol/L 20(L) 21(L) 18(L)  Calcium 8.9 - 10.3 mg/dL 8.7(L) 9.8 8.8(L)      NUTRITION - FOCUSED PHYSICAL EXAM:  Nutrition-Focused physical exam completed. Findings are  moderate orbital, buccal fat depletion, moderate clavicle, temporal muscle depletion, and mild edema.    Diet Order:   Diet Order             Diet NPO time specified Except for: Sips with Meds  Diet effective now                   EDUCATION NEEDS:  Not appropriate for education at this time  Skin:  Skin Integrity Issues:: Stage IV Stage IV: sacrum with moderate drainage per WOC  Last BM:  1/25 type 5  Height:   Ht Readings from Last 1 Encounters:  08/27/2021 5\' 6"  (1.676 m)    Weight:   Wt Readings from Last 1 Encounters:  08/24/2021 60.8 kg    Ideal Body Weight:   59 kg  BMI:  Body mass index is 21.63 kg/m.  Estimated Nutritional Needs:   Kcal:  2000-2200  Protein:  88-95 gr  Fluid:  2 liters daily   Colman Cater MS,RD,CSG,LDN Contact: Shea Evans

## 2021-08-26 NOTE — H&P (Addendum)
TRH H&P    Patient Demographics:    Colleen Lee, is a 66 y.o. female  MRN: [id number]  DOB - [birth year]  Admit Date - 08/26/2021  Referring MD/NP/PA: Vanita Panda  Outpatient Primary MD for the patient is Sandi Mariscal, MD  Patient coming from: SNF  Chief complaint- Altered mental status   HPI:    Colleen Lee  is a 66 y.o. female, with history of anemia, asthma, chronic constipation, cirrhosis, fibromyalgia, GERD, [name], hypertension, and more presents to ED with chief complaint of altered mental status.  Patient is unfortunately not able to give any history.  Handwritten note at bedside reports patient was shivering, had a fever of 100.7, and was altered.  Of note patient was recently discharged on 22 January.  At that time she had also presented for altered mental status.  She had been recently seen by her neurologist who thought her weakness was secondary to chronic liver disease -secondary to alcohol abuse, failure to thrive, and vitamin deficiency.  CT head showed no intracranial abnormality.  Ammonia level was negative.  Patient was started on IV fluids, folate, IV thiamine.  Stroke work-up was initiated and showed a small new CVA that was likely not the cause of her symptoms.  EEG showed no seizures.  Echo showed an ejection fraction of 65 to 70%.  Neurologist recommended 81 mg aspirin for subacute stroke.  She was evaluated by PT and skilled nursing was recommended.  Patient can return to the emergency room on 23 January.  At that time it seemed her husband was concerned that she did not have oxygen on at the facility.  He was concerned that she may be oxygen deprived as she was withdrawn.  She normalized on 2 L nasal cannula and was discharged back to the facility.  Patient returns today she is rhonchorous with a wet cough.  She withdraws from pain, opens her eyes to painful stimuli, and with repeated painful  stimuli she will wake up long enough to swallow pills, but is not answering questions.  Today she is requiring 3 L nasal cannula to maintain her oxygen sats.  She has peripheral edema.  She is tachycardic, tachypneic, febrile, with white blood cell count of 15.6 and lactic acidosis in the ED.  Work-up revealed likely HCAP, and likely UTI.  Patient was started on broad-spectrum antibiotics with sepsis protocol.  More details about the ED Temp 97.5, heart rate 128-141, respiratory rate 25-31, blood pressure 118/94, satting 97% on 3 L nasal cannula Leukocytosis 13.6, hemoglobin 10.4, platelets 287 BMP unremarkable aside from decreased bicarb 21 Alk phos 424, albumin 1.7, AST 54, ALT 45-alk phos is about double baseline Ammonia 44 Lactic acid initially 4.6, 4.5, then up to 4.7 back down to 4.5 Negative respiratory panel UA is borderline indicative of UTI Chest x-ray shows bilateral pneumonia Blood cultures and urine culture pending UDS shows benzos and opiates CT head shows atrophy, chronic microvascular disease, no acute intracranial abnormality Again patient was started on vancomycin and cefepime She is given a 2 L bolus,  LR and NS restarted, LR will be continued Admission was requested due to Sepsis    Review of systems:    Review of systems could not be obtained secondary to altered mental status    Past History of the following :    Past Medical History:  Diagnosis Date   Anemia    Asthma    Chronic abdominal pain    Chronic back pain    Chronic constipation    Cirrhosis ([place])    Metavir score F4 on elastography 3244   Cyclical vomiting    Fibroids    Fibromyalgia    GERD (gastroesophageal reflux disease)    H. pylori infection 2014   treated with pylera, had to be treated again as it was not eradicated. Urea breath test then negative after subsequent treatment.    [name]    HCV RNA positive 09/2012   Hypertension    Marijuana use    Nausea and vomiting     chronic, recurrent   PONV (postoperative nausea and vomiting)    pt thinks maybe once she had N&V   Sciatica of left side       Past Surgical History:  Procedure Laterality Date   BALLOON DILATION  12/20/2017   Procedure: BALLOON DILATION;  Surgeon: Danie Binder, MD;  [facility];  Service: Endoscopy;;  pyloric dilation   BIOPSY  12/20/2017   Procedure: BIOPSY;  Surgeon: Danie Binder, MD;  [facility];  Service: Endoscopy;;  duodenal and gastric biopsy   BIOPSY  07/15/2021   Procedure: BIOPSY;  Surgeon: Harvel Quale, MD;  [facility];  Service: Gastroenterology;;   COLONOSCOPY  01/18/2008   WNU:UVOZDG rectum.  Long redundant colon, a diminutive sigmoid polyp status post cold biopsy removed. Hyperplastic polyp. Repeat colonoscopy June 2014 due to family history of colon cancer   COLONOSCOPY WITH ESOPHAGOGASTRODUODENOSCOPY (EGD) N/A 11/02/2012   UYQ:[id number] gastric mucosa of doubtful, +H.pylori. Incomplete colonoscopy due to patient unable to tolerate exam, proximal colon seen. Patient refused ACBE.   COLONOSCOPY WITH PROPOFOL N/A 03/08/2013   Dr. Gala Romney: colonic polyp-removed as scribed above. Internal Hemorrhoids. Pathology did not reveal any colonic tissue, only mucus. SURVEILLANCE DUE Aug 2019   COLONOSCOPY WITH PROPOFOL N/A 01/19/2018   Dr. Gala Romney: 6 mm cecal inflammatoy polyp, otherwise normal., Surveilance in 5 year s   ESOPHAGEAL DILATION  12/20/2017   Procedure: ESOPHAGEAL DILATION;  Surgeon: Danie Binder, MD;  [facility];  Service: Endoscopy;;   ESOPHAGOGASTRODUODENOSCOPY  01/18/2008   RMR: Normal esophagus, normal  stomach   ESOPHAGOGASTRODUODENOSCOPY (EGD) WITH PROPOFOL N/A 02/09/2016   Normal esophagus, small hiatal hernia, portal hypertensive gastropathy, normal second portion of duodenum. Due in July 2019   ESOPHAGOGASTRODUODENOSCOPY (EGD) WITH PROPOFOL N/A 12/20/2017   Dr. Oneida Alar: benign appearing esophageal  stenosis s/p dilation, mild pyloric stenosis s/p biopsy and dilation, mild duodenitis.    ESOPHAGOGASTRODUODENOSCOPY (EGD) WITH PROPOFOL N/A 06/12/2020   Rourk: Normal esophagus, portal hypertensive gastropathy, no specimens collected.  Tentatively plan for repeat EGD in 2 years.   ESOPHAGOGASTRODUODENOSCOPY (EGD) WITH PROPOFOL N/A 07/15/2021   Procedure: ESOPHAGOGASTRODUODENOSCOPY (EGD) WITH PROPOFOL;  Surgeon: Harvel Quale, MD;  [facility];  Service: Gastroenterology;  Laterality: N/A;   HYSTEROSCOPY  05/11/2016   Procedure: HYSTEROSCOPY;  Surgeon: Jonnie Kind, MD;  [facility];  Service: Gynecology;;   KNEE SURGERY     left knee   MULTIPLE EXTRACTIONS WITH ALVEOLOPLASTY N/A  10/15/2013   Procedure: MULTIPLE EXTRACTION WITH ALVEOLOPLASTY;  Surgeon: Gae Bon, DDS;  Location: [place];  Service: Oral Surgery;  Laterality: N/A;   POLYPECTOMY N/A 03/08/2013   Procedure: POLYPECTOMY;  Surgeon: Daneil Dolin, MD;  [facility];  Service: Endoscopy;  Laterality: N/A;   POLYPECTOMY N/A 05/11/2016   Procedure: REMOVAL OF ENDOMETRIAL POLYP;  Surgeon: Jonnie Kind, MD;  [facility];  Service: Gynecology;  Laterality: N/A;   RESECTION DISTAL CLAVICAL Right 01/30/2019   Procedure: RESECTION DISTAL CLAVICAL;  Surgeon: Carole Civil, MD;  [facility];  Service: Orthopedics;  Laterality: Right;   SHOULDER OPEN ROTATOR CUFF REPAIR Right 01/30/2019   Procedure: ROTATOR CUFF REPAIR SHOULDER OPEN;  Surgeon: Carole Civil, MD;  [facility];  Service: Orthopedics;  Laterality: Right;  pt to arrive at 7:30 for PICC at 8:00   [place] Left 02/05/2015   Procedure: LEFT TOTAL KNEE ARTHROPLASTY;  Surgeon: Carole Civil, MD;  [facility];  Service: Orthopedics;  Laterality: Left;      Social History:      Social History   Tobacco Use   Smoking status: Every Day    Packs/day: 1.00    Years: 40.00    Pack years: 40.00     Types: Cigarettes   Smokeless tobacco: Never   Tobacco comments:    Smokes one pack of cigarettes every 1.5 days  Substance Use Topics   Alcohol use: Not Currently    Comment: 1 bottle of dinner wine daily; 06/30/21 stopped drinking       Family History :     Family History  Problem Relation Age of Onset   Cancer Mother        breast   Asthma Mother    Liver cancer Sister    Lung cancer Sister    Colon cancer Brother 39           Multiple myeloma Brother    Prostate cancer Brother    Pancreatic cancer Brother       Home Medications:   Prior to Admission medications   Medication Sig Start Date End Date Taking? Authorizing Provider  albuterol (VENTOLIN HFA) 108 (90 Base) MCG/ACT inhaler Inhale 2 puffs into the lungs every 6 (six) hours as needed for wheezing or shortness of breath. 07/16/21   Roxan Hockey, MD  allopurinol (ZYLOPRIM) 300 MG tablet Take 300 mg by mouth daily. 04/15/21   [provider]  amLODipine (NORVASC) 5 MG tablet Take 1 tablet (5 mg total) by mouth daily. 08/23/21   Shawna Clamp, MD  aspirin EC 81 MG EC tablet Take 1 tablet (81 mg total) by mouth daily. Swallow whole. 08/23/21   Shawna Clamp, MD  Cholecalciferol (DIALYVITE VITAMIN D3 MAX) 1.25 MG (50000 UT) TABS Take 1 tablet by mouth every 7 (seven) days.    [provider]  cyclobenzaprine (FLEXERIL) 5 MG tablet Take 5 mg by mouth 3 (three) times daily. 04/15/21   [provider]  folic acid (FOLVITE) 1 MG tablet Take 1 tablet (1 mg total) by mouth daily. 01/06/21   Barton Dubois, MD  gabapentin (NEURONTIN) 100 MG capsule Take 1 capsule (100 mg total) by mouth 3 (three) times daily. Patient taking differently: Take 100 mg by mouth daily. 12/22/18   Carole Civil, MD  HYDROcodone-acetaminophen (NORCO/VICODIN) 5-325 MG tablet Take 1 tablet by mouth every 6 (six) hours as needed. Patient taking differently: Take 1 tablet by mouth every  6 (six) hours as needed for moderate  pain. 06/08/21   Eber Hong, MD  hydrOXYzine (ATARAX) 10 MG tablet Take 1 tablet (10 mg total) by mouth daily as needed. Patient taking differently: Take 10 mg by mouth daily as needed for anxiety. 07/16/21   Shon Hale, MD  lactulose (CHRONULAC) 10 GM/15ML solution Take 30 mLs (20 g total) by mouth 2 (two) times daily as needed for mild constipation or moderate constipation. 07/16/21   Shon Hale, MD  megestrol (MEGACE) 40 MG tablet Take 1 tablet (40 mg total) by mouth daily. 07/16/21   Shon Hale, MD  metoprolol tartrate (LOPRESSOR) 50 MG tablet Take 1 tablet (50 mg total) by mouth 2 (two) times daily. 08/23/21   Cipriano Bunker, MD  naloxone [facility]) nasal spray 4 mg/0.1 mL See admin instructions. ADMINISTER A SINGLE SPRAYEOF NARCAN INTO ONECNOSTRIL. REPEAT AFTER 3SMINUTES IN OTHER NOSTRIL IF NO RESPONSE. 10/03/20   [provider]  ondansetron (ZOFRAN) 4 MG tablet Take 1 tablet (4 mg total) by mouth every 6 (six) hours as needed for nausea. 07/16/21   Shon Hale, MD  pantoprazole (PROTONIX) 40 MG tablet Take 1 tablet (40 mg total) by mouth 2 (two) times daily. 07/16/21 07/16/22  Shon Hale, MD  Plecanatide (TRULANCE) 3 MG TABS Take 3 mg by mouth daily. 06/30/21 06/30/22  Rourk, Gerrit Friends, MD  potassium chloride (MICRO-K) 10 MEQ CR capsule Take 20 mEq by mouth daily. 05/30/21   [provider]  sodium bicarbonate 650 MG tablet Take 1 tablet (650 mg total) by mouth 3 (three) times daily. 08/23/21   Cipriano Bunker, MD     Allergies:     Allergies  Allergen Reactions   Penicillins Rash    has tolerated ceftriaxone in the past     Physical Exam:   Vitals  Blood pressure (!) 119/96, pulse (!) 107, temperature (!) 97.5 F (36.4 C), temperature source Oral, resp. rate (!) 22, height 5\' 6"  (1.676 m), weight 60.8 kg, SpO2 98 %.   1.  General: Patient lying supine in bed, head of bed elevated, ill-appearing   2. Psychiatric: Obtunded and not  cooperating with exam  3. Neurologic: Obtunded, face symmetric, withdrawing from pain in all 4 extremities, not following commands, protecting airway   4. HEENMT:  Head is atraumatic, normocephalic, pupils reactive to light, neck is supple, trachea is midline, mucous membranes are moist   5. Respiratory : Lungs are diffusely rhonchorous greater on right than left, no wheeze, increased work of breathing present, 3 L nasal cannula in place, no cyanosis   6. Cardiovascular : Heart rate tachycardic, rhythm is regular, no murmurs, rubs or gallops, 2+ pitting edema to mid thigh, peripheral pulses palpated   7. Gastrointestinal:  Abdomen is soft, nondistended, nontender to palpation bowel sounds active, no masses or organomegaly palpated   8. Skin:  Skin is warm, dry and intact without rashes, stage III sacral decub present  9.Musculoskeletal:  No acute deformities or trauma, no asymmetry in tone, peripheral pulses palpated, no tenderness to palpation in the extremities     Data Review:    CBC Recent Labs  Lab 08/20/21 0538 08/20/21 1310 08/21/21 0451 08/21/21 1900 08/22/21 0515 08/23/21 0543 08/05/2021 1752  WBC 10.8*  --  9.7  --  11.7* 13.1* 13.6*  HGB 7.6*   < > 6.6* 9.8* 9.3* 9.9* 10.4*  HCT 22.3*   < > 20.2* 29.3* 26.4* 28.9* 31.0*  PLT 241  --  222  --  204 244 287  MCV 99.1  --  98.5  --  94.6 95.4 97.8  MCH 33.8  --  32.2  --  33.3 32.7 32.8  MCHC 34.1  --  32.7  --  35.2 34.3 33.5  RDW 16.3*  --  16.0*  --  17.7* 17.9* 18.3*  LYMPHSABS  --   --   --   --   --   --  0.9  MONOABS  --   --   --   --   --   --  0.4  EOSABS  --   --   --   --   --   --  0.0  BASOSABS  --   --   --   --   --   --  0.0   < > = values in this interval not displayed.   ------------------------------------------------------------------------------------------------------------------  Results for orders placed or performed during the hospital encounter of 08/24/2021 (from the past 48 hour(s))   Urinalysis, Routine w reflex microscopic Urine, Clean Catch     Status: Abnormal   Collection Time: 08/12/2021  5:21 PM  Result Value Ref Range   Color, Urine AMBER (A) YELLOW    Comment: BIOCHEMICALS MAY BE AFFECTED BY COLOR   APPearance HAZY (A) CLEAR   Specific Gravity, Urine 1.016 1.005 - 1.030   pH 5.0 5.0 - 8.0   Glucose, UA NEGATIVE NEGATIVE mg/dL   Hgb urine dipstick NEGATIVE NEGATIVE   Bilirubin Urine NEGATIVE NEGATIVE   Ketones, ur NEGATIVE NEGATIVE mg/dL   Protein, ur 30 (A) NEGATIVE mg/dL   Nitrite POSITIVE (A) NEGATIVE   Leukocytes,Ua MODERATE (A) NEGATIVE   RBC / HPF 6-10 0 - 5 RBC/hpf   WBC, UA 11-20 0 - 5 WBC/hpf   Bacteria, UA MANY (A) NONE SEEN   Squamous Epithelial / LPF 0-5 0 - 5   Mucus PRESENT    Ca Oxalate Crys, UA PRESENT     Comment: Performed at [facility], [address] [place], [place] [address]  Urine rapid drug screen (hosp performed)     Status: Abnormal   Collection Time: 08/02/2021  5:21 PM  Result Value Ref Range   Opiates POSITIVE (A) NONE DETECTED   Cocaine NONE DETECTED NONE DETECTED   Benzodiazepines POSITIVE (A) NONE DETECTED   Amphetamines NONE DETECTED NONE DETECTED   Tetrahydrocannabinol NONE DETECTED NONE DETECTED   Barbiturates NONE DETECTED NONE DETECTED    Comment: (NOTE) DRUG SCREEN FOR MEDICAL PURPOSES ONLY.  IF CONFIRMATION IS NEEDED FOR ANY PURPOSE, NOTIFY LAB WITHIN 5 DAYS.  LOWEST DETECTABLE LIMITS FOR URINE DRUG SCREEN Drug Class                     Cutoff (ng/mL) Amphetamine and metabolites    1000 Barbiturate and metabolites    200 Benzodiazepine                 604 Tricyclics and metabolites     300 Opiates and metabolites        300 Cocaine and metabolites        300 THC                            50 Performed at [facility], [address] [place], [place] [address]   Ammonia     Status: Abnormal   Collection Time: 08/11/2021  5:52 PM  Result Value Ref Range  Ammonia 44 (H) 9 - 35 umol/L    Comment:  Performed at [facility], [address], [place], [place] [address]  Comprehensive metabolic panel     Status: Abnormal   Collection Time: 08/20/2021  5:52 PM  Result Value Ref Range   Sodium 142 135 - 145 mmol/L   Potassium 4.1 3.5 - 5.1 mmol/L   Chloride 108 98 - 111 mmol/L   CO2 21 (L) 22 - 32 mmol/L   Glucose, Bld 111 (H) 70 - 99 mg/dL    Comment: Glucose reference range applies only to samples taken after fasting for at least 8 hours.   BUN 17 8 - 23 mg/dL   Creatinine, Ser 1.02 (H) 0.44 - 1.00 mg/dL   Calcium 9.8 8.9 - 10.3 mg/dL   Total Protein 6.4 (L) 6.5 - 8.1 g/dL   Albumin 1.7 (L) 3.5 - 5.0 g/dL   AST 54 (H) 15 - 41 U/L   ALT 25 0 - 44 U/L   Alkaline Phosphatase 424 (H) 38 - 126 U/L   Total Bilirubin 1.2 0.3 - 1.2 mg/dL   GFR, Estimated >60 >60 mL/min    Comment: (NOTE) Calculated using the CKD-EPI Creatinine Equation (2021)    Anion gap 13 5 - 15    Comment: Performed at [facility], [address], [place], [place] [address]  Lactic acid, plasma     Status: Abnormal   Collection Time: 08/11/2021  5:52 PM  Result Value Ref Range   Lactic Acid, Venous 4.6 (HH) 0.5 - 1.9 mmol/L    Comment: CRITICAL RESULT CALLED TO, READ BACK BY AND VERIFIED WITH: GILLEY,T AG 1851 ON 1.24.23 BY ISLEY,B Performed at [facility], [address] [place], [place] [address]   Ethanol     Status: None   Collection Time: 08/31/2021  5:52 PM  Result Value Ref Range   Alcohol, Ethyl (B) <10 <10 mg/dL    Comment: (NOTE) Lowest detectable limit for serum alcohol is 10 mg/dL.  For medical purposes only. Performed at [facility], [address] [place], [place] [address]   CBC WITH DIFFERENTIAL     Status: Abnormal   Collection Time: 08/11/2021  5:52 PM  Result Value Ref Range   WBC 13.6 (H) 4.0 - 10.5 K/uL   RBC 3.17 (L) 3.87 - 5.11 MIL/uL   Hemoglobin 10.4 (L) 12.0 - 15.0 g/dL   HCT 31.0 (L) 36.0 - 46.0 %   MCV 97.8 80.0 - 100.0 fL   MCH 32.8 26.0 - 34.0 pg   MCHC 33.5 30.0 -  36.0 g/dL   RDW 18.3 (H) 11.5 - 15.5 %   Platelets 287 150 - 400 K/uL   nRBC 0.0 0.0 - 0.2 %   Neutrophils Relative % 90 %   Neutro Abs 12.2 (H) 1.7 - 7.7 K/uL   Lymphocytes Relative 6 %   Lymphs Abs 0.9 0.7 - 4.0 K/uL   Monocytes Relative 3 %   Monocytes Absolute 0.4 0.1 - 1.0 K/uL   Eosinophils Relative 0 %   Eosinophils Absolute 0.0 0.0 - 0.5 K/uL   Basophils Relative 0 %   Basophils Absolute 0.0 0.0 - 0.1 K/uL   Immature Granulocytes 1 %   Abs Immature Granulocytes 0.11 (H) 0.00 - 0.07 K/uL    Comment: Performed at [facility], [address], [place], [place] [address]  APTT     Status: None   Collection Time: 08/27/2021  5:52 PM  Result Value Ref Range  aPTT 34 24 - 36 seconds    Comment: Performed at [facility], [address] [place], [place] [address]  Culture, blood (single)     Status: None (Preliminary result)   Collection Time: 08/15/2021  5:52 PM   Specimen: Vein; Blood  Result Value Ref Range   Specimen Description BLOOD LEFT ARM    Special Requests      BOTTLES DRAWN AEROBIC AND ANAEROBIC Blood Culture adequate volume Performed at [facility], [address], [place], [place] [address]    Culture PENDING    Report Status PENDING   Resp Panel by RT-PCR (Flu A&B, Covid) Nasopharyngeal Swab     Status: None   Collection Time: 08/20/2021  7:14 PM   Specimen: Nasopharyngeal Swab; Nasopharyngeal(NP) swabs in vial transport medium  Result Value Ref Range   SARS Coronavirus 2 by RT PCR NEGATIVE NEGATIVE    Comment: (NOTE) SARS-CoV-2 target nucleic acids are NOT DETECTED.  The SARS-CoV-2 RNA is generally detectable in upper respiratory specimens during the acute phase of infection. The lowest concentration of SARS-CoV-2 viral copies this assay can detect is 138 copies/mL. A negative result does not preclude SARS-Cov-2 infection and should not be used as the sole basis for treatment or other patient management decisions. A negative result may occur with  improper  specimen collection/handling, submission of specimen other than nasopharyngeal swab, presence of viral mutation(s) within the areas targeted by this assay, and inadequate number of viral copies(<138 copies/mL). A negative result must be combined with clinical observations, patient history, and epidemiological information. The expected result is Negative.  Fact Sheet for Patients:  BloggerCourse.com  Fact Sheet for Healthcare Providers:  SeriousBroker.it  This test is no t yet approved or cleared by the [place] FDA and  has been authorized for detection and/or diagnosis of SARS-CoV-2 by FDA under an Emergency Use Authorization (EUA). This EUA will remain  in effect (meaning this test can be used) for the duration of the COVID-19 declaration under Section 564(b)(1) of the Act, 21 U.S.C.section 360bbb-3(b)(1), unless the authorization is terminated  or revoked sooner.       Influenza A by PCR NEGATIVE NEGATIVE   Influenza B by PCR NEGATIVE NEGATIVE    Comment: (NOTE) The Xpert Xpress SARS-CoV-2/FLU/RSV plus assay is intended as an aid in the diagnosis of influenza from Nasopharyngeal swab specimens and should not be used as a sole basis for treatment. Nasal washings and aspirates are unacceptable for Xpert Xpress SARS-CoV-2/FLU/RSV testing.  Fact Sheet for Patients: BloggerCourse.com  Fact Sheet for Healthcare Providers: SeriousBroker.it  This test is not yet approved or cleared by the [place] FDA and has been authorized for detection and/or diagnosis of SARS-CoV-2 by FDA under an Emergency Use Authorization (EUA). This EUA will remain in effect (meaning this test can be used) for the duration of the COVID-19 declaration under Section 564(b)(1) of the Act, 21 U.S.C. section 360bbb-3(b)(1), unless the authorization is terminated or revoked.  Performed at [facility], [address] [place], [place] [address]   Lactic acid, plasma     Status: Abnormal   Collection Time: 08/16/2021  8:40 PM  Result Value Ref Range   Lactic Acid, Venous 4.5 (HH) 0.5 - 1.9 mmol/L    Comment: CRITICAL RESULT CALLED TO, READ BACK BY AND VERIFIED WITH: MRICK,L AT 2116 ON 1.24.23 BY ISLEY,B Performed at [facility], [address] [place], [place] [address]   Lactic acid, plasma     Status: Abnormal  Collection Time: 08/26/2021 10:40 PM  Result Value Ref Range   Lactic Acid, Venous 4.7 (HH) 0.5 - 1.9 mmol/L    Comment: CRITICAL RESULT CALLED TO, READ BACK BY AND VERIFIED WITH: Merrick,l@[email or web address]  by [employer] 1.24.23 Performed at [facility], [address] [place], [place] [address]   Lactic acid, plasma     Status: Abnormal   Collection Time: 08/26/21 12:40 AM  Result Value Ref Range   Lactic Acid, Venous 4.5 (HH) 0.5 - 1.9 mmol/L    Comment: CRITICAL VALUE NOTED.  VALUE IS CONSISTENT WITH PREVIOUSLY REPORTED AND CALLED VALUE. Performed at [facility], [address] [place], [place] [address]    *Note: Due to a large number of results and/or encounters for the requested time period, some results have not been displayed. A complete set of results can be found in Results Review.    Chemistries  Recent Labs  Lab 08/20/21 0538 08/20/21 1310 08/21/21 0451 08/22/21 0515 08/23/21 0543 08/07/2021 1752  NA 139 137 138 135 138 142  K 2.0* 3.1* 3.8 3.3* 3.9 4.1  CL 106 108 111 110 111 108  CO2 19* 17* 18* 16* 18* 21*  GLUCOSE 112* 170* 82 93 92 111*  BUN 8 9 9 8 10 17   CREATININE 1.43* 1.38* 1.18* 1.01* 0.85 1.02*  CALCIUM 7.0* 6.8* 7.2* 7.7* 8.8* 9.8  MG 1.1*  --  1.6* 1.6* 1.9  --   AST  --   --   --   --   --  54*  ALT  --   --   --   --   --  25  ALKPHOS  --   --   --   --   --  424*  BILITOT  --   --   --   --   --  1.2    ------------------------------------------------------------------------------------------------------------------  ------------------------------------------------------------------------------------------------------------------ GFR: Estimated Creatinine Clearance: 50.8 mL/min (A) (by C-G formula based on SCr of 1.02 mg/dL (H)). Liver Function Tests: Recent Labs  Lab 08/15/2021 1752  AST 54*  ALT 25  ALKPHOS 424*  BILITOT 1.2  PROT 6.4*  ALBUMIN 1.7*   No results for input(s): LIPASE, AMYLASE in the last 168 hours. Recent Labs  Lab 08/07/2021 1752  AMMONIA 44*   Coagulation Profile: No results for input(s): INR, PROTIME in the last 168 hours. Cardiac Enzymes: No results for input(s): CKTOTAL, CKMB, CKMBINDEX, TROPONINI in the last 168 hours. BNP (last 3 results) No results for input(s): PROBNP in the last 8760 hours. HbA1C: No results for input(s): HGBA1C in the last 72 hours. CBG: No results for input(s): GLUCAP in the last 168 hours. Lipid Profile: No results for input(s): CHOL, HDL, LDLCALC, TRIG, CHOLHDL, LDLDIRECT in the last 72 hours. Thyroid Function Tests: No results for input(s): TSH, T4TOTAL, FREET4, T3FREE, THYROIDAB in the last 72 hours. Anemia Panel: No results for input(s): VITAMINB12, FOLATE, FERRITIN, TIBC, IRON, RETICCTPCT in the last 72 hours.  --------------------------------------------------------------------------------------------------------------- Urine analysis:    Component Value Date/Time   COLORURINE AMBER (A) 08/28/2021 1721   APPEARANCEUR HAZY (A) 08/04/2021 1721   LABSPEC 1.016 08/05/2021 1721   PHURINE 5.0 08/15/2021 1721   GLUCOSEU NEGATIVE 08/02/2021 1721   HGBUR NEGATIVE 08/14/2021 1721   BILIRUBINUR NEGATIVE 08/16/2021 1721   KETONESUR NEGATIVE 08/18/2021 1721   PROTEINUR 30 (A) 08/17/2021 1721   UROBILINOGEN 0.2 04/01/2015 1140   NITRITE POSITIVE (A) 08/24/2021 1721   LEUKOCYTESUR MODERATE (A) 08/31/2021 1721       Imaging Results:  CT HEAD WO CONTRAST  Result Date: 08/24/2021 CLINICAL DATA:  Mental status change, unknown cause EXAM: CT HEAD WITHOUT CONTRAST TECHNIQUE: Contiguous axial images were obtained from the base of the skull through the vertex without intravenous contrast. RADIATION DOSE REDUCTION: This exam was performed according to the departmental dose-optimization program which includes automated exposure control, adjustment of the mA and/or kV according to patient size and/or use of iterative reconstruction technique. COMPARISON:  08/17/2021 FINDINGS: Brain: There is atrophy and chronic small vessel disease changes. No acute intracranial abnormality. Specifically, no hemorrhage, hydrocephalus, mass lesion, acute infarction, or significant intracranial injury. Vascular: No hyperdense vessel or unexpected calcification. Skull: No acute calvarial abnormality. Sinuses/Orbits: No acute findings Other: None IMPRESSION: Atrophy, chronic microvascular disease. No acute intracranial abnormality. Electronically Signed   By: Rolm Baptise M.D.   On: 08/26/2021 18:45   DG Chest Port 1 View  Result Date: 08/28/2021 CLINICAL DATA:  Altered mental status EXAM: PORTABLE CHEST 1 VIEW COMPARISON:  08/16/2021, CT 09/27/2020 FINDINGS: Interval development of diffuse ground-glass and interstitial opacity throughout left thorax with patchy airspace disease in the right lower lung. No pleural effusion. Normal cardiomediastinal silhouette. No pneumothorax. IMPRESSION: Interval development of patchy right lower lung airspace disease and diffuse interstitial and ground-glass opacity throughout left thorax, suspect that findings are second bilateral infection/pneumonia. Electronically Signed   By: Donavan Foil M.D.   On: 08/24/2021 17:25    My personal review of EKG: Rhythm sinus tachycardia with a rate of 139/min, QTc 518 ,no Acute ST changes   Assessment & Plan:    Principal Problem:   Sepsis ([place]) Active Problems:    Hepatic cirrhosis (HCC)   Alcoholic cirrhosis of liver without ascites (HCC)   Acute metabolic encephalopathy   History of alcohol abuse   Acute on chronic respiratory failure (HCC)   Hypoalbuminemia   HCAP (healthcare-associated pneumonia)   Sepsis Sepsis secondary to pneumonia versus UTI SIRS criteria tachycardic, tachypneic, febrile, leukocytosis lactic acidosis Most likely source of infection is pneumonia Life threatening organ dysfunction 2/2 infection is evidenced by: Lactic acidosis 4.7, metabolic encephalopathy Antibiotics started vancomycin and cefepime Fluids given prior to admission 2 L bolus Sepsis order set utilized Monitor this patient on Stepdown Blood cultures urine culture pending HCAP As evidenced by bilateral pneumonia on chest x-ray, leukocytosis, fever, cough  Blood cultures, sputum cultures Continue breathing treatments Continue Mucinex Continue to monitor on stepdown UTI UA is borderline indicative of UTI Urine culture pending Urine covered by broad-spectrum antibiotics for HCAP Continue to monitor Acute on chronic respiratory failure with hypoxia Patient using 2 L nasal cannula baseline from chart review Today requiring 3 L nasal cannula to maintain oxygen saturations Most likely secondary to bilateral pneumonia Continue breathing treatments, continue Mucinex Treat pneumonia as above Continue to monitor Acute metabolic encephalopathy Secondary to sepsis Head CT without acute changes Alcohol level less than 10 UDS with benzos and opiates -not likely the cause Ammonia 44-continue lactulose Continue to monitor Hepatic cirrhosis Secondary to history of alcohol abuse Continue lactulose Hypoalbuminemia With peripheral edema Patient required third spacing of sepsis bolus that could result in pulmonary edema 3 doses of albumin 12.5 g Continue to monitor Decreased bicarb Bicarb 21 Continue home sodium bicarb  DVT Prophylaxis-   Heparin - SCDs    AM Labs Ordered, also please review Full Orders  Family Communication: No family at bedside  Code Status: Full  Admission status: Inpatient :The appropriate admission status for this patient is INPATIENT. Inpatient status is judged to  be reasonable and necessary in order to provide the required intensity of service to ensure the patient's safety. The patient's presenting symptoms, physical exam findings, and initial radiographic and laboratory data in the context of their chronic comorbidities is felt to place them at high risk for further clinical deterioration. Furthermore, it is not anticipated that the patient will be medically stable for discharge from the hospital within 2 midnights of admission. The following factors support the admission status of inpatient.     The patient's presenting symptoms include shivering and altered mental status. The worrisome physical exam findings include SIRS criteria. The initial radiographic and laboratory data are worrisome because of bilateral pneumonia. The chronic co-morbidities include liver cirrhosis, history of alcohol abuse, GERD.       * I certify that at the point of admission it is my clinical judgment that the patient will require inpatient hospital care spanning beyond 2 midnights from the point of admission due to high intensity of service, high risk for further deterioration and high frequency of surveillance required.*  Disposition: Anticipated Discharge date 3-4 days Discharge to SNF  Time spent in minutes : [place]

## 2021-08-27 ENCOUNTER — Inpatient Hospital Stay (HOSPITAL_COMMUNITY): Payer: Medicare Other

## 2021-08-27 ENCOUNTER — Telehealth: Payer: Self-pay | Admitting: *Deleted

## 2021-08-27 ENCOUNTER — Other Ambulatory Visit: Payer: Self-pay | Admitting: *Deleted

## 2021-08-27 DIAGNOSIS — J690 Pneumonitis due to inhalation of food and vomit: Secondary | ICD-10-CM | POA: Diagnosis not present

## 2021-08-27 DIAGNOSIS — A419 Sepsis, unspecified organism: Secondary | ICD-10-CM | POA: Diagnosis not present

## 2021-08-27 DIAGNOSIS — K7030 Alcoholic cirrhosis of liver without ascites: Secondary | ICD-10-CM | POA: Diagnosis not present

## 2021-08-27 DIAGNOSIS — G9341 Metabolic encephalopathy: Secondary | ICD-10-CM | POA: Diagnosis not present

## 2021-08-27 LAB — COMPREHENSIVE METABOLIC PANEL
ALT: 19 U/L (ref 0–44)
AST: 40 U/L (ref 15–41)
Albumin: 1.8 g/dL — ABNORMAL LOW (ref 3.5–5.0)
Alkaline Phosphatase: 277 U/L — ABNORMAL HIGH (ref 38–126)
Anion gap: 9 (ref 5–15)
BUN: 17 mg/dL (ref 8–23)
CO2: 20 mmol/L — ABNORMAL LOW (ref 22–32)
Calcium: 9.5 mg/dL (ref 8.9–10.3)
Chloride: 111 mmol/L (ref 98–111)
Creatinine, Ser: 0.76 mg/dL (ref 0.44–1.00)
GFR, Estimated: >60 mL/min (ref >60)
Glucose, Bld: 132 mg/dL — ABNORMAL HIGH (ref 70–99)
Potassium: 2.7 mmol/L — CL (ref 3.5–5.1)
Sodium: 140 mmol/L (ref 135–145)
Total Bilirubin: 0.6 mg/dL (ref 0.3–1.2)
Total Protein: 5.3 g/dL — ABNORMAL LOW (ref 6.5–8.1)

## 2021-08-27 LAB — URINE CULTURE: Culture: >=100000 — AB

## 2021-08-27 LAB — CBC
HCT: 26.8 % — ABNORMAL LOW (ref 36.0–46.0)
Hemoglobin: 8.6 g/dL — ABNORMAL LOW (ref 12.0–15.0)
MCH: 31.4 pg (ref 26.0–34.0)
MCHC: 32.1 g/dL (ref 30.0–36.0)
MCV: 97.8 fL (ref 80.0–100.0)
Platelets: 265 10*3/uL (ref 150–400)
RBC: 2.74 MIL/uL — ABNORMAL LOW (ref 3.87–5.11)
RDW: 18.7 % — ABNORMAL HIGH (ref 11.5–15.5)
WBC: 8.4 10*3/uL (ref 4.0–10.5)
nRBC: 0 % (ref 0.0–0.2)

## 2021-08-27 LAB — LACTIC ACID, PLASMA: Lactic Acid, Venous: 3 mmol/L (ref 0.5–1.9)

## 2021-08-27 LAB — AMMONIA: Ammonia: 23 umol/L (ref 9–35)

## 2021-08-27 LAB — C DIFFICILE QUICK SCREEN W PCR REFLEX
C Diff antigen: NEGATIVE
C Diff interpretation: NOT DETECTED
C Diff toxin: NEGATIVE

## 2021-08-27 LAB — MAGNESIUM: Magnesium: 2.1 mg/dL (ref 1.7–2.4)

## 2021-08-27 MED ORDER — IOHEXOL 300 MG/ML  SOLN
100.0000 mL | Freq: Once | INTRAMUSCULAR | Status: AC | PRN
  Administered 2021-08-27: 100 mL via INTRAVENOUS

## 2021-08-27 MED ORDER — POTASSIUM CHLORIDE 10 MEQ/100ML IV SOLN
10.0000 meq | INTRAVENOUS | Status: AC
  Administered 2021-08-27 (×4): 10 meq via INTRAVENOUS
  Filled 2021-08-27 (×4): qty 100

## 2021-08-27 MED ORDER — IOHEXOL 9 MG/ML PO SOLN
ORAL | Status: AC
  Filled 2021-08-27: qty 500

## 2021-08-27 MED ORDER — METOPROLOL TARTRATE 5 MG/5ML IV SOLN
5.0000 mg | Freq: Four times a day (QID) | INTRAVENOUS | Status: DC
  Administered 2021-08-27 – 2021-08-28 (×4): 5 mg via INTRAVENOUS
  Filled 2021-08-27 (×4): qty 5

## 2021-08-27 MED ORDER — POTASSIUM CHLORIDE CRYS ER 20 MEQ PO TBCR
40.0000 meq | EXTENDED_RELEASE_TABLET | Freq: Once | ORAL | Status: AC
  Administered 2021-08-27: 40 meq via ORAL
  Filled 2021-08-27: qty 2

## 2021-08-27 MED ORDER — ORAL CARE MOUTH RINSE
15.0000 mL | Freq: Two times a day (BID) | OROMUCOSAL | Status: DC
  Administered 2021-08-27 – 2021-08-31 (×8): 15 mL via OROMUCOSAL

## 2021-08-27 MED ORDER — LOPERAMIDE HCL 2 MG PO CAPS
2.0000 mg | ORAL_CAPSULE | ORAL | Status: DC | PRN
  Administered 2021-08-27: 2 mg via ORAL
  Filled 2021-08-27: qty 1

## 2021-08-27 MED ORDER — FUROSEMIDE 10 MG/ML IJ SOLN
40.0000 mg | Freq: Once | INTRAMUSCULAR | Status: AC
  Administered 2021-08-27: 40 mg via INTRAVENOUS
  Filled 2021-08-27: qty 4

## 2021-08-27 NOTE — TOC Progression Note (Signed)
Transition of Care [facility]) - Progression Note    Patient Details  Name: NANA VASTINE MRN: [id number] Date of Birth: Aug 21, 1955  Transition of [facility]) CM/SW Contact  Shade Flood, LCSW Phone Number: 08/27/2021, 11:03 AM  Clinical Narrative:     TOC following. Per MD, pt will likely be stable for dc over the weekend. Updated Debbie at [facility] and they will accept pt back at dc.  Assigned TOC will follow.  Expected Discharge Plan: [place] Barriers to Discharge: Continued Medical Work up  Expected Discharge Plan and Services Expected Discharge Plan: Start In-house Referral: Clinical Social Work   Post Acute Care Choice: [place] Living arrangements for the past 2 months: [place], Single Family Home                                       Social Determinants of Health (SDOH) Interventions    Readmission Risk Interventions Readmission Risk Prevention Plan 08/14/2021 07/16/2021 05/19/2021  Transportation Screening Complete Complete Complete  HRI or Home Care Consult - - Complete  Social Work Consult for [place] Planning/Counseling - - Complete  Palliative Care Screening - - Not Applicable  Medication Review Press photographer) Complete Complete Complete  HRI or Home Care Consult Complete Complete -  SW Recovery Care/Counseling Consult Complete Complete -  Palliative Care Screening - Not Applicable -  [place] Complete Complete -  Some recent data might be hidden

## 2021-08-27 NOTE — Progress Notes (Addendum)
Colleen NOTE  VISTA Lee LKT:[id number] DOB: Nov 20, 1955 DOA: 08/08/2021 PCP: Sandi Mariscal, MD  Brief History:  66 year old female with a history of alcoholic liver cirrhosis, fibromyalgia, hypertension, [name], anemia of chronic disease, vitamin deficiency presenting with altered mental status.  The patient was unable to provide any history, but there was some type of handwritten note accompanying the patient stating that the patient has been shivering with a fever up to 100.7 F and confusion. Notably, the patient had a prolonged hospitalization from 08/13/2021 to 08/23/2021.  During that hospitalization, the patient was treated for acute metabolic encephalopathy which is likely multifactorial including failure to thrive and numerous electrolyte and vitamin and mineral deficiencies.  She was also noted to have an incidental subacute stroke on MRI of the brain.  She was seen by neurology and recommended continuing aspirin 81 mg daily.  EEG was negative.  During the hospitalization, the patient received 1 unit PRBC without any evidence of ongoing blood loss.  PT recommended skilled nursing facility, but the patient was ultimately discharged home. In the ED, the patient was afebrile and tachycardic up to 130s.  He was hemodynamically stable.  Oxygen saturation was 97-100% on 3 L.  BMP shows sodium 139, potassium 3.4, bicarbonate 20, BUN 13, creatinine 0.0.  Ammonia 44, AST 34, ALT 19, alk phosphatase 255, total bilirubin 0.8.  Chest x-ray showed patchy right lower lobe airspace disease with diffuse interstitial GGO in the left thorax.  Patient was started on vancomycin and cefepime and lactated Ringer's.   Assessment/Plan: Severe sepsis -Secondary to pneumonia and UTI -Presented with elevated lactate, tachycardia, leukocytosis -Continue vancomycin and cefepime pending culture data -Follow blood culture--neg to date -Follow urine culture--multiple organisms -Lactic acid peaked  4.6 -Check PCT-5.33   Lobar pneumonia -Suspect a degree of aspiration -MRSA screen--neg -Continue empiric cefepime for now   Acute respiratory failure with hypoxia -Secondary to pneumonia -COVID-19 PCR negative -Stable on 3 L>>2L -Wean oxygen as tolerated -repeat CXR  SVT/Atrial tachycardia -HR transiently up to 140s -start lopressor 5 mg IV q 6 -1/25 Echo--EF 65-70%, no WMA, G1DD, RVSP 39.8 -TSH 1.183   Pyuria -UA 11-20 WBC -Follow urine culture>>multiple organisms -Continue empiric antibiotics   Acute metabolic encephalopathy -Multifactorial including infectious process, vitamin and mineral deficiencies, dehydration -UA 11-20 WBC -TSH 1.183 -B12--1698 -08/18/2021 RPR negative -TSH--1.183 -only A&O x 1 on 1/26 but more alert and cooperative   Alcoholic liver cirrhosis -INR 1.5   Hyperammonemia -Unclear clinical significance presently -Recheck ammonia--23 -Discontinue lactulose -If metabolic encephalopathy persists, and ammonia remains elevated, would pursue treatment at that point   Anemia of chronic disease -3/42/8768 folic acid 5.5 -08/16/7260 B12 1658 -08/17/2028 thiamine 183.3 -Transfuse for hemoglobin less than 7 -Continue folic acid and thiamine supplementation   Hypomagnesemia -Repleted   Hypokalemia -Replete   Stage 4 sacral decubitus -present on admission -appreciate WOCN recommendation -CT abd/pelvis                 Family Communication:  sister updated  1/26   Consultants:  none   Code Status:  FULL   DVT Prophylaxis:  [place] Colleen     Procedures: As Listed in Colleen Note Above   Antibiotics: Vanc 1/24>>1/25 Cefepime 1/25>>     Subjective: Patient is awake, pleasantly confused.  Denies cp but has sob.  Complains of abd pain, nonlocalized.  No vomiting.  + loose stool.  Remainder ROS unobtainable due to confusion  Objective:  Vitals:   08/27/21 0400 08/27/21 0500 08/27/21 0600 08/27/21 0700  BP:  126/84 125/81 119/77   Pulse: 97 98 (!) 101 94  Resp: 18 (!) $Remo'22 20 19  'PmMWX$ Temp:      TempSrc:      SpO2: 95% 96% 92% 93%  Weight:      Height:        Intake/Output Summary (Last 24 hours) at 08/27/2021 0927 Last data filed at 08/26/2021 1645 Gross per 24 hour  Intake 479.84 ml  Output 950 ml  Net -470.16 ml   Weight change: 0.9 kg Exam:  General:  Pt is alert, follows commands appropriately, not in acute distress HEENT: No icterus, No thrush, No neck mass, [place]/AT Cardiovascular: RRR, S1/S2, no rubs, no gallops Respiratory: bilateral crackles. No wheeze Abdomen: Soft/+BS, non tender, non distended, no guarding Extremities: 1 + LE edema, No lymphangitis, No petechiae, No rashes, no synovitis   Data Reviewed: I have personally reviewed following labs and imaging studies Basic Metabolic Panel: Recent Labs  Lab 08/21/21 0451 08/22/21 0515 08/23/21 0543 08/18/2021 1752 08/26/21 0406 08/27/21 0624  NA 138 135 138 142 139 140  K 3.8 3.3* 3.9 4.1 3.4* 2.7*  CL 111 110 111 108 112* 111  CO2 18* 16* 18* 21* 20* 20*  GLUCOSE 82 93 92 111* 130* 132*  BUN $Re'9 8 10 17 13 17  'wPv$ CREATININE 1.18* 1.01* 0.85 1.02* 0.80 0.76  CALCIUM 7.2* 7.7* 8.8* 9.8 8.7* 9.5  MG 1.6* 1.6* 1.9  --  1.5* 2.1  PHOS 2.4* 2.2* 2.4*  --   --   --    Liver Function Tests: Recent Labs  Lab 08/15/2021 1752 08/26/21 0406 08/27/21 0624  AST 54* 34 40  ALT $Re'25 19 19  'Eca$ ALKPHOS 424* 255* 277*  BILITOT 1.2 0.8 0.6  PROT 6.4* 4.8* 5.3*  ALBUMIN 1.7* 1.6* 1.8*   No results for input(s): LIPASE, AMYLASE in the last 168 hours. Recent Labs  Lab 08/14/2021 1752 08/27/21 0624  AMMONIA 44* 23   Coagulation Profile: Recent Labs  Lab 08/26/21 0406  INR 1.5*   CBC: Recent Labs  Lab 08/22/21 0515 08/23/21 0543 08/30/2021 1752 08/26/21 0406 08/27/21 0624  WBC 11.7* 13.1* 13.6* 10.8* 8.4  NEUTROABS  --   --  12.2* 10.4*  --   HGB 9.3* 9.9* 10.4* 7.5* 8.6*  HCT 26.4* 28.9* 31.0* 23.3* 26.8*  MCV 94.6 95.4 97.8 98.3 97.8  PLT 204 244  287 214 265   Cardiac Enzymes: No results for input(s): CKTOTAL, CKMB, CKMBINDEX, TROPONINI in the last 168 hours. BNP: Invalid input(s): POCBNP CBG: No results for input(s): GLUCAP in the last 168 hours. HbA1C: No results for input(s): HGBA1C in the last 72 hours. Urine analysis:    Component Value Date/Time   COLORURINE AMBER (A) 08/09/2021 1721   APPEARANCEUR HAZY (A) 08/16/2021 1721   LABSPEC 1.016 08/08/2021 1721   PHURINE 5.0 08/11/2021 1721   GLUCOSEU NEGATIVE 08/24/2021 1721   HGBUR NEGATIVE 08/11/2021 1721   BILIRUBINUR NEGATIVE 08/07/2021 1721   KETONESUR NEGATIVE 08/30/2021 1721   PROTEINUR 30 (A) 08/05/2021 1721   UROBILINOGEN 0.2 04/01/2015 1140   NITRITE POSITIVE (A) 08/24/2021 1721   LEUKOCYTESUR MODERATE (A) 08/02/2021 1721   Sepsis Labs: $RemoveBefo'@[email or web address]'[id number]$ (procalcitonin:4,lacticidven:4) ) Recent Results (from the past 240 hour(s))  Urine Culture     Status: Abnormal   Collection Time: 08/06/2021  3:30 AM   Specimen: Urine, Clean Catch  Result Value Ref Range Status   Specimen Description  Final    URINE, CLEAN CATCH Performed at [facility], [address] [place], [place] [address]    Special Requests   Final    NONE Performed at [facility], [address] [place], [place] [address]    Culture (A)  Final    >=100,000 COLONIES/mL MULTIPLE SPECIES PRESENT, SUGGEST RECOLLECTION   Report Status 08/27/2021 FINAL  Final  Culture, blood (single)     Status: None (Preliminary result)   Collection Time: 08/31/2021  5:52 PM   Specimen: BLOOD LEFT ARM  Result Value Ref Range Status   Specimen Description BLOOD LEFT ARM  Final   Special Requests   Final    BOTTLES DRAWN AEROBIC AND ANAEROBIC Blood Culture adequate volume   Culture   Final    NO GROWTH 2 DAYS Performed at [facility], [address], [place], [place] [address]    Report Status PENDING  Incomplete  Resp Panel by RT-PCR (Flu A&B, Covid) Nasopharyngeal Swab     Status: None   Collection Time:  08/07/2021  7:14 PM   Specimen: Nasopharyngeal Swab; Nasopharyngeal(NP) swabs in vial transport medium  Result Value Ref Range Status   SARS Coronavirus 2 by RT PCR NEGATIVE NEGATIVE Final    Comment: (NOTE) SARS-CoV-2 target nucleic acids are NOT DETECTED.  The SARS-CoV-2 RNA is generally detectable in upper respiratory specimens during the acute phase of infection. The lowest concentration of SARS-CoV-2 viral copies this assay can detect is 138 copies/mL. A negative result does not preclude SARS-Cov-2 infection and should not be used as the sole basis for treatment or other patient management decisions. A negative result may occur with  improper specimen collection/handling, submission of specimen other than nasopharyngeal swab, presence of viral mutation(s) within the areas targeted by this assay, and inadequate number of viral copies(<138 copies/mL). A negative result must be combined with clinical observations, patient history, and epidemiological information. The expected result is Negative.  Fact Sheet for Patients:  EntrepreneurPulse.com.au  Fact Sheet for Healthcare Providers:  IncredibleEmployment.be  This test is no t yet approved or cleared by the [place] FDA and  has been authorized for detection and/or diagnosis of SARS-CoV-2 by FDA under an Emergency Use Authorization (EUA). This EUA will remain  in effect (meaning this test can be used) for the duration of the COVID-19 declaration under Section 564(b)(1) of the Act, 21 U.S.C.section 360bbb-3(b)(1), unless the authorization is terminated  or revoked sooner.       Influenza A by PCR NEGATIVE NEGATIVE Final   Influenza B by PCR NEGATIVE NEGATIVE Final    Comment: (NOTE) The Xpert Xpress SARS-CoV-2/FLU/RSV plus assay is intended as an aid in the diagnosis of influenza from Nasopharyngeal swab specimens and should not be used as a sole basis for treatment. Nasal washings  and aspirates are unacceptable for Xpert Xpress SARS-CoV-2/FLU/RSV testing.  Fact Sheet for Patients: EntrepreneurPulse.com.au  Fact Sheet for Healthcare Providers: IncredibleEmployment.be  This test is not yet approved or cleared by the [place] FDA and has been authorized for detection and/or diagnosis of SARS-CoV-2 by FDA under an Emergency Use Authorization (EUA). This EUA will remain in effect (meaning this test can be used) for the duration of the COVID-19 declaration under Section 564(b)(1) of the Act, 21 U.S.C. section 360bbb-3(b)(1), unless the authorization is terminated or revoked.  Performed at [facility], [address], [place], [place] [address]   MRSA Next Gen by PCR, Nasal     Status: None   Collection Time:  08/26/21 12:00 AM   Specimen: Nasal Mucosa; Nasal Swab  Result Value Ref Range Status   MRSA by PCR Next Gen NOT DETECTED NOT DETECTED Final    Comment: (NOTE) The GeneXpert MRSA Assay (FDA approved for NASAL specimens only), is one component of a comprehensive MRSA colonization surveillance program. It is not intended to diagnose MRSA infection nor to guide or monitor treatment for MRSA infections. Test performance is not FDA approved in patients less than 6 years old. Performed at [facility], [address], [place], [place] [address]      Scheduled Meds:  aspirin EC  81 mg Oral Daily   Chlorhexidine Gluconate Cloth  6 each Topical Daily   feeding supplement  237 mL Oral BID BM   folic acid  1 mg Oral Daily   guaiFENesin  600 mg Oral BID   Colleen  5,000 Units Subcutaneous Q8H   ipratropium-albuterol  3 mL Nebulization TID   metoprolol tartrate  5 mg Intravenous Q6H   pantoprazole  40 mg Oral BID   sodium bicarbonate  650 mg Oral TID   thiamine injection  100 mg Intravenous Daily   Continuous Infusions:  ceFEPime (MAXIPIME) IV 2 g (08/27/21 0545)   potassium chloride      Procedures/Studies: CT  ANGIO HEAD NECK W WO CM  Result Date: 08/17/2021 CLINICAL DATA:  Stroke/TIA, determine embolic source. Right frontoparietal and cerebellar infarcts as well as abnormal appearance of the right vertebral artery on MRI. EXAM: CT ANGIOGRAPHY HEAD AND NECK TECHNIQUE: Multidetector CT imaging of the head and neck was performed using the standard protocol during bolus administration of intravenous contrast. Multiplanar CT image reconstructions and MIPs were obtained to evaluate the vascular anatomy. Carotid stenosis measurements (when applicable) are obtained utilizing NASCET criteria, using the distal internal carotid diameter as the denominator. RADIATION DOSE REDUCTION: This exam was performed according to the departmental dose-optimization program which includes automated exposure control, adjustment of the mA and/or kV according to patient size and/or use of iterative reconstruction technique. CONTRAST:  192mL OMNIPAQUE IOHEXOL 350 MG/ML SOLN COMPARISON:  Head MRI 08/17/2021 FINDINGS: CT HEAD FINDINGS Brain: There is a small acute infarct superiorly in the right cerebellar hemisphere as shown on MRI. The subcentimeter right frontoparietal subcortical infarct on MRI is not well seen by CT. No intracranial hemorrhage, mass, midline shift, or extra-axial fluid collection is identified. There is mild cerebral atrophy. Periventricular white matter hypodensities are nonspecific but compatible with mild chronic small vessel ischemic disease. Vascular: Calcified atherosclerosis at the skull base. Skull: No fracture or suspicious osseous lesion. Sinuses: Unchanged fluid in the right sphenoid sinus. Trace right mastoid fluid. Orbits: Bilateral cataract extraction. Review of the MIP images confirms the above findings CTA NECK FINDINGS Aortic arch: Normal variant aortic arch branching pattern with common origin of the brachiocephalic and left common carotid arteries. Widely patent arch vessel origins. Right carotid system:  Patent with a small amount calcified plaque at the carotid bifurcation. No evidence of a significant stenosis or dissection. Left carotid system: Patent with minimal atherosclerotic plaque at the carotid bifurcation and possible tiny web laterally in the proximal aspect of the carotid bulb. No evidence of a significant stenosis or dissection. Vertebral arteries: The vertebral arteries are patent without evidence of a significant stenosis or dissection of the left vertebral artery which is strongly dominant. The right vertebral artery is diffusely small in caliber with a superimposed severe stenosis at its origin. Skeleton: Mild disc and moderate facet degeneration in the  cervical spine. Other neck: No evidence of cervical lymphadenopathy or mass. Upper chest: Centrilobular emphysema. Partially visualized right PICC. Review of the MIP images confirms the above findings CTA HEAD FINDINGS Anterior circulation: Internal carotid arteries are patent from skull base to carotid termini. The cavernous carotid segments are tortuous with atherosclerotic calcification greater on the left not resulting in significant stenosis. ACAs and MCAs are patent without evidence of a proximal branch occlusion or significant proximal stenosis. No aneurysm is identified. Posterior circulation: The intracranial vertebral arteries are patent to the basilar with mild nonstenotic calcified plaque on the left. There is moderate to severe stenosis of the right vertebral artery proximal to the PICA origin. Patent PICA and SCA origins are seen bilaterally. The basilar artery is widely patent and tortuous. Posterior communicating arteries are diminutive or absent. Both PCAs are patent without evidence of a significant proximal stenosis. No aneurysm is identified. Venous sinuses: As permitted by contrast timing, patent. Anatomic variants: None. Review of the MIP images confirms the above findings IMPRESSION: 1. No large vessel occlusion. 2.  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 3. No significant stenosis of the strongly dominant left vertebral artery. 4. Minimal cervical carotid artery atherosclerosis without significant stenosis. 5. Emphysema (ICD10-J43.9). Electronically Signed   By: Logan Bores M.D.   On: 08/17/2021 14:53   CT HEAD WO CONTRAST  Result Date: 08/17/2021 CLINICAL DATA:  Mental status change, unknown cause EXAM: CT HEAD WITHOUT CONTRAST TECHNIQUE: Contiguous axial images were obtained from the base of the skull through the vertex without intravenous contrast. RADIATION DOSE REDUCTION: This exam was performed according to the departmental dose-optimization program which includes automated exposure control, adjustment of the mA and/or kV according to patient size and/or use of iterative reconstruction technique. COMPARISON:  08/17/2021 FINDINGS: Brain: There is atrophy and chronic small vessel disease changes. No acute intracranial abnormality. Specifically, no hemorrhage, hydrocephalus, mass lesion, acute infarction, or significant intracranial injury. Vascular: No hyperdense vessel or unexpected calcification. Skull: No acute calvarial abnormality. Sinuses/Orbits: No acute findings Other: None IMPRESSION: Atrophy, chronic microvascular disease. No acute intracranial abnormality. Electronically Signed   By: Rolm Baptise M.D.   On: 08/17/2021 18:45   CT Head Wo Contrast  Result Date: 08/04/2021 CLINICAL DATA:  Provided history: Neuro deficit, acute, stroke suspected. Mental status change, unknown cause. Slurred speech. EXAM: CT HEAD WITHOUT CONTRAST TECHNIQUE: Contiguous axial images were obtained from the base of the skull through the vertex without intravenous contrast. COMPARISON:  Brain MRI 07/13/2021.  Head CT 05/13/2014. FINDINGS: Brain: Mild generalized cerebral atrophy. Mild patchy and ill-defined hypoattenuation within the cerebral white matter, nonspecific but  compatible with chronic small vessel ischemic disease. There is no acute intracranial hemorrhage. No demarcated cortical infarct. No extra-axial fluid collection. No evidence of an intracranial mass. No midline shift. Vascular: No hyperdense vessel.  Atherosclerotic calcifications. Skull: Normal. Negative for fracture or focal lesion. Sinuses/Orbits: Visualized orbits show no acute finding. No significant paranasal sinus disease at the imaged levels. IMPRESSION: No evidence of acute intracranial abnormality. Mild chronic small vessel ischemic changes within the cerebral white matter. Mild generalized cerebral atrophy. Electronically Signed   By: Kellie Simmering D.O.   On: 08/04/2021 14:14   MR BRAIN WO CONTRAST  Result Date: 08/17/2021 CLINICAL DATA:  Provided history: Delirium. EXAM: MRI HEAD WITHOUT CONTRAST TECHNIQUE: Multiplanar, multiecho pulse sequences of the brain and surrounding structures were obtained without intravenous contrast. COMPARISON:  Prior head CT examinations 08/13/2021 and  earlier. Brain MRI 07/13/2021. FINDINGS: Brain: Intermittently motion degraded examination, limiting evaluation. Most notably, there is severe motion degradation of the sagittal T1 weighted sequence, severe motion degradation of the axial T1 weighted sequence and severe motion degradation of the coronal T2 TSE sequence. Mild to moderate generalized cerebral atrophy. Comparatively mild cerebellar atrophy. 5 mm acute/early subacute infarct within the left frontoparietal subcortical white matter (series 5, image 25) (series 7, image 12). 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 Chronic small vessel ischemic changes which are mild in cerebral white matter, and mild to moderate in the pons. No evidence of an intracranial mass. No chronic intracranial blood products. No extra-axial fluid collection. No midline shift. Vascular: Similar to  the prior brain MRI of 07/13/2021, there is signal abnormality within the intracranial right vertebral artery suggesting high-grade stenosis or vessel occlusion. Skull and upper cervical spine: Within described limitations, no focal suspicious marrow lesion is identified. Sinuses/Orbits: Visualized orbits show no acute finding. Large fluid level, and background mild mucosal thickening, within the right sphenoid sinus. Other: Trace fluid within the bilateral mastoid air cells. IMPRESSION: Intermittently motion degraded examination, as described and limiting evaluation. 5 mm acute/early subacute infarct within the right frontoparietal subcortical white matter. 1.8 x 0.7 cm acute infarct within the superior right cerebellar hemisphere. Background chronic small vessel ischemic changes which are mild in the cerebral white matter, and mild-to-moderate in the pons, stable from the brain MRI of 07/13/2021. Similar to the prior MRI, there is signal abnormality within the intracranial right vertebral artery suggesting high-grade stenosis or vessel occlusion. MR or CT angiography may be obtained for further evaluation, as clinically warranted. Mild-to-moderate generalized cerebral atrophy. Comparatively mild cerebellar atrophy. Right sphenoid sinusitis. Electronically Signed   By: Kellie Simmering D.O.   On: 08/17/2021 09:35   DG Chest Port 1 View  Result Date: 08/20/2021 CLINICAL DATA:  Altered mental status EXAM: PORTABLE CHEST 1 VIEW COMPARISON:  08/16/2021, CT 09/27/2020 FINDINGS: Interval development of diffuse ground-glass and interstitial opacity throughout left thorax with patchy airspace disease in the right lower lung. No pleural effusion. Normal cardiomediastinal silhouette. No pneumothorax. IMPRESSION: Interval development of patchy right lower lung airspace disease and diffuse interstitial and ground-glass opacity throughout left thorax, suspect that findings are second bilateral infection/pneumonia.  Electronically Signed   By: Donavan Foil M.D.   On: 08/06/2021 17:25   DG CHEST PORT 1 VIEW  Result Date: 08/16/2021 CLINICAL DATA:  The reason for exam is stated as dyspnea. Hx of asthma, HTN, and current smoker of 1 pack a day x 40 years. Negative covid-19 test x 3 days ago. EXAM: PORTABLE CHEST 1 VIEW COMPARISON:  Chest x-ray 07/09/2021 acidosis chest 09/27/2000 FINDINGS: Right PICC line with tip overlying the expected region of the superior cavoatrial junction. The heart and mediastinal contours are unchanged. No focal consolidation. No pulmonary edema. No pleural effusion. No pneumothorax. No acute osseous abnormality. IMPRESSION: No active disease. Electronically Signed   By: Iven Finn M.D.   On: 08/16/2021 19:46   EEG adult  Result Date: 08/17/2021 Tsosie Billing, MD     08/17/2021 10:12 PM [facility] Routine EEG Report Patient Name:   Reinaldo Berber Date of Birth:   1956-07-04 Identification Number:   MRN - [id number] Date of Study:   08/17/2021 17:49:17 Duration: 23 min Indication: Encephalopathy, Technical Summary: A routine 20 channel electroencephalogram using the international 10-20 system of electrode  placement was performed. Background: 5-6 Hz, Poorly formed States      Drowsy: were seen during drowsiness Abnormalities Generalized Slowing: Diffuse generalized slowing Activation Procedures Hyperventilation: Not performed Photic Stimulation: Not performed Classification: Abnormal : Diagnosis: This is abnormal EEG, the Presence of Generalized slowing is consistent with Encephalopathy Dr Tsosie Billing [facility] [phone] Case [id number]  ECHOCARDIOGRAM COMPLETE  Result Date: 08/17/2021    ECHOCARDIOGRAM REPORT   Patient Name:   KETZIA GUZEK [facility] Date of Exam: 08/17/2021 Medical Rec #:  [id number]      Height:       66.0 in Accession #:    [id number]     Weight:       127.0 lb Date of Birth:  May 09, 1956     BSA:           1.649 m Patient Age:    81 years       BP:           122/94 mmHg Patient Gender: F              HR:           101 bpm. Exam Location:  [facility] Procedure: 2D Echo, Cardiac Doppler and Color Doppler Indications:    Stroke  History:        Patient has prior history of Echocardiogram examinations, most                 recent 07/06/2019. Risk Factors:Hypertension. ETOH Abuse.  Sonographer:    Wenda Low Referring Phys: Pearson  1. Left ventricular ejection fraction, by estimation, is 65 to 70%. The left ventricle has normal function. The left ventricle has no regional wall motion abnormalities. There is mild left ventricular hypertrophy. Left ventricular diastolic parameters are consistent with Grade I diastolic dysfunction (impaired relaxation).  2. Right ventricular systolic function is normal. The right ventricular size is normal. There is mildly elevated pulmonary artery systolic pressure.  3. The mitral valve is normal in structure. No evidence of mitral valve regurgitation. No evidence of mitral stenosis.  4. The aortic valve has an indeterminant number of cusps. Aortic valve regurgitation is not visualized. No aortic stenosis is present. FINDINGS  Left Ventricle: Left ventricular ejection fraction, by estimation, is 65 to 70%. The left ventricle has normal function. The left ventricle has no regional wall motion abnormalities. The left ventricular internal cavity size was normal in size. There is  mild left ventricular hypertrophy. Left ventricular diastolic parameters are consistent with Grade I diastolic dysfunction (impaired relaxation). Normal left ventricular filling pressure. Right Ventricle: The right ventricular size is normal. No increase in right ventricular wall thickness. Right ventricular systolic function is normal. There is mildly elevated pulmonary artery systolic pressure. The tricuspid regurgitant velocity is 2.82  m/s, and with an assumed right atrial  pressure of 8 mmHg, the estimated right ventricular systolic pressure is 62.3 mmHg. Left Atrium: Left atrial size was normal in size. Right Atrium: Right atrial size was normal in size. Pericardium: There is no evidence of pericardial effusion. Mitral Valve: The mitral valve is normal in structure. No evidence of mitral valve regurgitation. No evidence of mitral valve stenosis. MV peak gradient, 8.0 mmHg. The mean mitral valve gradient is 3.0 mmHg. Tricuspid Valve: The tricuspid valve is not well visualized. Tricuspid valve regurgitation is mild . No evidence of tricuspid stenosis. Aortic Valve: The aortic valve has an indeterminant number of cusps. Aortic valve regurgitation is not visualized. No aortic stenosis  is present. Aortic valve mean gradient measures 3.0 mmHg. Aortic valve peak gradient measures 5.6 mmHg. Aortic valve area, by VTI measures 2.20 cm. Pulmonic Valve: The pulmonic valve was not well visualized. Pulmonic valve regurgitation is not visualized. No evidence of pulmonic stenosis. Aorta: The aortic root is normal in size and structure. IAS/Shunts: No atrial level shunt detected by color flow Doppler.  LEFT VENTRICLE PLAX 2D LVIDd:         3.10 cm     Diastology LVIDs:         2.00 cm     LV e' medial:    5.66 cm/s LV PW:         1.20 cm     LV E/e' medial:  11.8 LV IVS:        1.30 cm     LV e' lateral:   6.85 cm/s LVOT diam:     1.80 cm     LV E/e' lateral: 9.8 LV SV:         47 LV SV Index:   29 LVOT Area:     2.54 cm  LV Volumes (MOD) LV vol d, MOD A2C: 20.3 ml LV vol d, MOD A4C: 14.2 ml LV vol s, MOD A2C: 8.2 ml LV vol s, MOD A4C: 5.8 ml LV SV MOD A2C:     12.1 ml LV SV MOD A4C:     14.2 ml LV SV MOD BP:      10.1 ml RIGHT VENTRICLE RV Basal diam:  3.20 cm RV Mid diam:    2.80 cm RV S prime:     17.30 cm/s LEFT ATRIUM             Index        RIGHT ATRIUM           Index LA diam:        3.10 cm 1.88 cm/m   RA Area:     11.50 cm LA Vol (A2C):   30.9 ml 18.74 ml/m  RA Volume:   26.20 ml   15.89 ml/m LA Vol (A4C):   17.0 ml 10.31 ml/m LA Biplane Vol: 25.1 ml 15.22 ml/m  AORTIC VALVE                    PULMONIC VALVE AV Area (Vmax):    2.48 cm     PV Vmax:       0.75 m/s AV Area (Vmean):   2.42 cm     PV Peak grad:  2.3 mmHg AV Area (VTI):     2.20 cm AV Vmax:           118.00 cm/s AV Vmean:          79.000 cm/s AV VTI:            0.214 m AV Peak Grad:      5.6 mmHg AV Mean Grad:      3.0 mmHg LVOT Vmax:         115.00 cm/s LVOT Vmean:        75.100 cm/s LVOT VTI:          0.185 m LVOT/AV VTI ratio: 0.86  AORTA Ao Root diam: 3.10 cm Ao Asc diam:  3.20 cm MITRAL VALVE                TRICUSPID VALVE MV Area (PHT): 5.97 cm     TR Peak grad:   31.8 mmHg MV Area VTI:  2.54 cm     TR Vmax:        282.00 cm/s MV Peak grad:  8.0 mmHg MV Mean grad:  3.0 mmHg     SHUNTS MV Vmax:       1.41 m/s     Systemic VTI:  0.18 m MV Vmean:      75.5 cm/s    Systemic Diam: 1.80 cm MV Decel Time: 127 msec MV E velocity: 66.80 cm/s MV A velocity: 112.00 cm/s MV E/A ratio:  0.60 Carlyle Dolly MD Electronically signed by Carlyle Dolly MD Signature Date/Time: 08/17/2021/3:31:03 PM    Final    CT HEAD CODE STROKE WO CONTRAST  Result Date: 08/13/2021 CLINICAL DATA:  Code stroke. Initial evaluation for acute stroke, left facial droop. EXAM: CT HEAD WITHOUT CONTRAST TECHNIQUE: Contiguous axial images were obtained from the base of the skull through the vertex without intravenous contrast. RADIATION DOSE REDUCTION: This exam was performed according to the departmental dose-optimization program which includes automated exposure control, adjustment of the mA and/or kV according to patient size and/or use of iterative reconstruction technique. COMPARISON:  Prior head CT from 08/04/2021. FINDINGS: Brain: Moderately advanced cerebral atrophy with chronic small vessel ischemic disease. No acute intracranial hemorrhage. No acute large vessel territory infarct. No mass lesion, midline shift or mass effect. No hydrocephalus or  extra-axial fluid collection. Vascular: No hyperdense vessel. Calcified atherosclerosis present at the skull base. Skull: Scalp soft tissues and calvarium within normal limits. Sinuses/Orbits: Globes and orbital soft tissues demonstrate no acute finding. Visualized paranasal sinuses and mastoid air cells are clear. Other: None. ASPECTS [facility] Stroke Program Early CT Score) - Ganglionic level infarction (caudate, lentiform nuclei, internal capsule, insula, M1-M3 cortex): 7 - Supraganglionic infarction (M4-M6 cortex): 3 Total score (0-10 with 10 being normal): 10 IMPRESSION: 1. No acute intracranial infarct or other abnormality. 2. ASPECTS is 10. 3. Moderately advanced cerebral atrophy with chronic small vessel ischemic disease. Results were called by telephone at the time of interpretation on 08/13/2021 at 10:52 pm to provider [facility] , who verbally acknowledged these results. Electronically Signed   By: Jeannine Boga M.D.   On: 08/13/2021 22:52   [place] EKG SITE RITE  Result Date: 08/14/2021 If Site Rite image not attached, placement could not be confirmed due to current cardiac rhythm.   Orson Eva, DO  [facility]  If 7PM-7AM, please contact night-coverage [email or web address] Password [facility] 08/27/2021, 9:27 AM   LOS: 2 days

## 2021-08-27 NOTE — Patient Outreach (Signed)
[facility]) Care Management  08/27/2021  Colleen Lee [birth year] [id number]   Member's case discussed with multidisciplinary team due to recurrent admissions.  Member was discharged to [facility] on 1/23, has had an ED visit (1/24) and has been readmitted back to the hospital since (readmitted 1/25).  Per notes, she will discharged back to SNF.  Team discussed palliative care involvement.  This RNCM contacted inpatient LCSW to inquire about consult.  She confirms there was one while member was inpatient, family initially agreed to DNR but then changed their mind.  Member remains full code, family not receptive to palliative care/hospice.  This care manager will follow up with member pending disposition from SNF.  Valente David, RN, MSN, [place] Manager [phone]

## 2021-08-28 DIAGNOSIS — J690 Pneumonitis due to inhalation of food and vomit: Secondary | ICD-10-CM

## 2021-08-28 DIAGNOSIS — G9341 Metabolic encephalopathy: Secondary | ICD-10-CM

## 2021-08-28 DIAGNOSIS — J9621 Acute and chronic respiratory failure with hypoxia: Secondary | ICD-10-CM

## 2021-08-28 DIAGNOSIS — R7881 Bacteremia: Secondary | ICD-10-CM

## 2021-08-28 DIAGNOSIS — A419 Sepsis, unspecified organism: Secondary | ICD-10-CM | POA: Diagnosis not present

## 2021-08-28 DIAGNOSIS — K7030 Alcoholic cirrhosis of liver without ascites: Secondary | ICD-10-CM | POA: Diagnosis not present

## 2021-08-28 LAB — CBC
HCT: 25.5 % — ABNORMAL LOW (ref 36.0–46.0)
Hemoglobin: 8.4 g/dL — ABNORMAL LOW (ref 12.0–15.0)
MCH: 32.6 pg (ref 26.0–34.0)
MCHC: 32.9 g/dL (ref 30.0–36.0)
MCV: 98.8 fL (ref 80.0–100.0)
Platelets: 281 10*3/uL (ref 150–400)
RBC: 2.58 MIL/uL — ABNORMAL LOW (ref 3.87–5.11)
RDW: 19.2 % — ABNORMAL HIGH (ref 11.5–15.5)
WBC: 9.5 10*3/uL (ref 4.0–10.5)
nRBC: 0 % (ref 0.0–0.2)

## 2021-08-28 LAB — BASIC METABOLIC PANEL
Anion gap: 8 (ref 5–15)
BUN: 16 mg/dL (ref 8–23)
CO2: 20 mmol/L — ABNORMAL LOW (ref 22–32)
Calcium: 8.7 mg/dL — ABNORMAL LOW (ref 8.9–10.3)
Chloride: 110 mmol/L (ref 98–111)
Creatinine, Ser: 0.78 mg/dL (ref 0.44–1.00)
GFR, Estimated: >60 mL/min (ref >60)
Glucose, Bld: 119 mg/dL — ABNORMAL HIGH (ref 70–99)
Potassium: 2.6 mmol/L — CL (ref 3.5–5.1)
Sodium: 138 mmol/L (ref 135–145)

## 2021-08-28 LAB — BLOOD CULTURE ID PANEL (REFLEXED) - BCID2

## 2021-08-28 LAB — BRAIN NATRIURETIC PEPTIDE: B Natriuretic Peptide: 789 pg/mL — ABNORMAL HIGH (ref 0.0–100.0)

## 2021-08-28 LAB — MAGNESIUM: Magnesium: 1.7 mg/dL (ref 1.7–2.4)

## 2021-08-28 LAB — PROCALCITONIN: Procalcitonin: 3.66 ng/mL

## 2021-08-28 LAB — POTASSIUM: Potassium: 4.3 mmol/L (ref 3.5–5.1)

## 2021-08-28 MED ORDER — FUROSEMIDE 10 MG/ML IJ SOLN
40.0000 mg | Freq: Once | INTRAMUSCULAR | Status: AC
Start: 2021-08-28 — End: 2021-08-28
  Administered 2021-08-28: 40 mg via INTRAVENOUS
  Filled 2021-08-28: qty 4

## 2021-08-28 MED ORDER — POTASSIUM CHLORIDE 10 MEQ/100ML IV SOLN
10.0000 meq | INTRAVENOUS | Status: AC
  Administered 2021-08-28 (×3): 10 meq via INTRAVENOUS
  Filled 2021-08-28: qty 100

## 2021-08-28 MED ORDER — POTASSIUM CHLORIDE 10 MEQ/100ML IV SOLN
10.0000 meq | INTRAVENOUS | Status: AC
  Administered 2021-08-28 (×2): 10 meq via INTRAVENOUS
  Filled 2021-08-28: qty 100

## 2021-08-28 MED ORDER — METOPROLOL TARTRATE 5 MG/5ML IV SOLN: 7.5000 mg | Freq: Four times a day (QID) | INTRAVENOUS | Status: DC

## 2021-08-28 MED ORDER — POTASSIUM CHLORIDE CRYS ER 20 MEQ PO TBCR
40.0000 meq | EXTENDED_RELEASE_TABLET | Freq: Once | ORAL | Status: AC
  Administered 2021-08-28: 40 meq via ORAL
  Filled 2021-08-28: qty 2

## 2021-08-28 MED ORDER — POTASSIUM CHLORIDE CRYS ER 20 MEQ PO TBCR
40.0000 meq | EXTENDED_RELEASE_TABLET | Freq: Every day | ORAL | Status: DC
  Administered 2021-08-29: 40 meq via ORAL
  Filled 2021-08-28: qty 2

## 2021-08-28 MED ORDER — METOPROLOL TARTRATE 5 MG/5ML IV SOLN
7.5000 mg | Freq: Four times a day (QID) | INTRAVENOUS | Status: DC
  Administered 2021-08-28 – 2021-08-29 (×5): 7.5 mg via INTRAVENOUS
  Filled 2021-08-28 (×5): qty 10

## 2021-08-28 MED ORDER — DILTIAZEM HCL 30 MG PO TABS
30.0000 mg | ORAL_TABLET | Freq: Four times a day (QID) | ORAL | Status: DC
  Administered 2021-08-28 – 2021-08-29 (×5): 30 mg via ORAL
  Filled 2021-08-28 (×6): qty 1

## 2021-08-28 MED ORDER — MAGNESIUM SULFATE 2 GM/50ML IV SOLN
2.0000 g | Freq: Once | INTRAVENOUS | Status: AC
  Administered 2021-08-28: 2 g via INTRAVENOUS
  Filled 2021-08-28: qty 50

## 2021-08-28 MED ORDER — METOPROLOL TARTRATE 5 MG/5ML IV SOLN
5.0000 mg | Freq: Four times a day (QID) | INTRAVENOUS | Status: DC
  Administered 2021-08-28: 5 mg via INTRAVENOUS
  Filled 2021-08-28 (×2): qty 5

## 2021-08-28 NOTE — Progress Notes (Signed)
Nutrition follow up  INTERVENTION:  If patient discharged back to facility over the weekend- recommend continue Ensure Enlive TID with meals daily and OT referral to assess self feeding capacity and or need for adaptive equipment.    NUTRITION DIAGNOSIS:   Severe Malnutrition related to chronic illness (cirrhosis) as evidenced by diet history energy intake < or equal to 75% for > or equal to 1 month, percent weight loss, moderate fat depletion, moderate muscle depletion.  -Ongoing   GOAL:  Provide needs based on ASPEN/SCCM guidelines (if aligned with pt care goals)   MONITOR:  Diet advancement, tube feeding initiation if appropriate, wt trends and labs    ASSESSMENT: Patient is a 66 yo female presents with hx of cirrhosis, acute on chronic respiratory failure, HCAP, sepsis, hypoalbuminemia, malnutrition and SIRS. Stage 4 sacral wound.   Patient with prolonged poor oral intake. Known to RD from recent admission.  Discharged 1/22 to SNF. Meets criteria for severe malnutrition. NPO currently. Recommend consider placing NGT if patient and MD concur. Intake of energy </=75% for >/=1 month and severe weight loss noted below.  1/27-Patient eyes are open today and sister is bedside assisting with her beverage. She is taking fluids better than food per family. Encouraged her to provide high protein/high calorie oral supplements TID since meal intake is negligible. (0-25%). Talked with NT who will assist patient with feeding as needed for lunch today if family not present. Patient is high risk for readmission given her malnourished state.   Patient weight is down significantly from 76.7 kg to current 60.8 kg x 3.5 months. A loss of 15.9 kg (21%) during time period.   Intake/Output Summary (Last 24 hours) at 08/28/2021 1209 Last data filed at 08/27/2021 1852 Gross per 24 hour  Intake 240 ml  Output 1000 ml  Net -760 ml    Medications reviewed and include: lactulose, folic acid, protonix,  sodium bicarbonate  IVF- Lactated Ringers @ 150 ml/hr   Labs: BMP Latest Ref Rng & Units 08/28/2021 08/27/2021 08/26/2021  Glucose 70 - 99 mg/dL 119(H) 132(H) 130(H)  BUN 8 - 23 mg/dL 16 17 13   Creatinine 0.44 - 1.00 mg/dL 0.78 0.76 0.80  BUN/Creat Ratio 6 - 22 (calc) - - -  Sodium 135 - 145 mmol/L 138 140 139  Potassium 3.5 - 5.1 mmol/L 2.6(LL) 2.7(LL) 3.4(L)  Chloride 98 - 111 mmol/L 110 111 112(H)  CO2 22 - 32 mmol/L 20(L) 20(L) 20(L)  Calcium 8.9 - 10.3 mg/dL 8.7(L) 9.5 8.7(L)      NUTRITION - FOCUSED PHYSICAL EXAM:  Nutrition-Focused physical exam completed. Findings are  moderate orbital, buccal fat depletion, moderate clavicle, temporal muscle depletion, and mild edema.    Diet Order:   Diet Order             DIET SOFT Room service appropriate? Yes; Fluid consistency: Thin  Diet effective now                   EDUCATION NEEDS:  Not appropriate for education at this time  Skin:  Skin Integrity Issues:: Stage IV Stage IV: sacrum with moderate drainage per WOC  Last BM:  1/26 type 6  Height:   Ht Readings from Last 1 Encounters:  08/07/2021 5\' 6"  (1.676 m)    Weight:   Wt Readings from Last 1 Encounters:  08/28/21 63.3 kg    Ideal Body Weight:   59 kg  BMI:  Body mass index is 22.52 kg/m.  Estimated Nutritional  Needs:   Kcal:  2000-2200  Protein:  88-95 gr  Fluid:  2 liters daily   Colleen Cater MS,RD,CSG,LDN Contact: Colleen Lee

## 2021-08-28 NOTE — Progress Notes (Addendum)
PROGRESS NOTE  Colleen Lee ATF:[id number] DOB: [birth year] DOA: 08/22/2021 PCP: Sandi Mariscal, MD  Brief History:  66 year old female with a history of alcoholic liver cirrhosis, fibromyalgia, hypertension, [name], anemia of chronic disease, vitamin deficiency presenting with altered mental status.  The patient was unable to provide any history, but there was some type of handwritten note accompanying the patient stating that the patient has been shivering with a fever up to 100.7 F and confusion. Notably, the patient had a prolonged hospitalization from 08/13/2021 to 08/23/2021.  During that hospitalization, the patient was treated for acute metabolic encephalopathy which is likely multifactorial including failure to thrive and numerous electrolyte and vitamin and mineral deficiencies.  She was also noted to have an incidental subacute stroke on MRI of the brain.  She was seen by neurology and recommended continuing aspirin 81 mg daily.  EEG was negative.  During the hospitalization, the patient received 1 unit PRBC without any evidence of ongoing blood loss.  PT recommended skilled nursing facility, but the patient was ultimately discharged home. In the ED, the patient was afebrile and tachycardic up to 130s.  He was hemodynamically stable.  Oxygen saturation was 97-100% on 3 L.  BMP shows sodium 139, potassium 3.4, bicarbonate 20, BUN 13, creatinine 0.0.  Ammonia 44, AST 34, ALT 19, alk phosphatase 255, total bilirubin 0.8.  Chest x-ray showed patchy right lower lobe airspace disease with diffuse interstitial GGO in the left thorax.  Patient was started on vancomycin and cefepime and lactated Ringer's.  Vancomycin was discontinued and cefepime was continued after MRSA screen was neg.  She developed fluid overload from fluid resuscitation and was given lasix IV x 2.  Pulmonary was consulted to assist and agreed with current management plan.  Assessment/Plan: Severe sepsis -Secondary to  bacteremia, pneumonia and UTI -Presented with elevated lactate, tachycardia, leukocytosis -Continue vancomycin and cefepime pending culture data -Follow blood culture--neg to date -Follow urine culture--multiple organisms -Lactic acid peaked 4.6 -Check PCT-5.33   Lobar pneumonia -Suspect a degree of aspiration -MRSA screen--neg -Continue empiric cefepime for now  Gram Negative Bacteremia -08/21/2021 blood culture--GNR -continue empiric cefepime pending culture data   Acute respiratory failure with hypoxia -Secondary to pneumonia and pleural effusions -COVID-19 PCR negative -Stable on 6 L>>3L -Wean oxygen as tolerated -repeat CXR--personally reviewed--increased interstitial markings, RUL opacity -lasix IV 40 mg on 1/26 and 1/27   SVT/Atrial tachycardia -HR transiently up to 140s -Continue lopressor 5 mg IV q 6 -add diltiazem 30 mg po q 6 hours -1/25 Echo--EF 65-70%, no WMA, G1DD, RVSP 39.8 -TSH 1.183   Pyuria -UA 11-20 WBC -Follow urine culture>>multiple organisms -Continue empiric antibiotics   Acute metabolic encephalopathy -Multifactorial including infectious process, vitamin and mineral deficiencies, dehydration -UA 11-20 WBC -TSH 1.183 -B12--1698 -08/18/2021 RPR negative -TSH--1.183 -only A&O x 67 on 1/26 but more alert and cooperative   Alcoholic liver cirrhosis -INR 1.5   Hyperammonemia -Unclear clinical significance presently -Recheck ammonia--23 -Discontinue lactulose -If metabolic encephalopathy persists, and ammonia remains elevated, would pursue treatment at that point   Anemia of chronic disease -2/70/6237 folic acid 5.5 -01/27/3150 B12 1658 -08/17/2028 thiamine 183.3 -Transfuse for hemoglobin less than 7 -Continue folic acid and thiamine supplementation   Hypomagnesemia -Repleted   Hypokalemia -Replete   Stage 4 sacral decubitus -present on admission -appreciate WOCN recommendation -CT abd/pelvis--New small peripheral areas of decreased  enhancement in right kidney,which may be due to small renal infarcts or pyelonephritis.  Bibasilar pulmonary airspace opacity, with small bilateral pleural effusions. +anasarca  Severe Malnutrition -nutritional supplements           Family Communication:   sister and niece updated 1/27  Consultants:  pulmonary  Code Status:  FULL  DVT Prophylaxis:  [place]ail Heparin   Procedures: As Listed in Progress Note Above  Antibiotics: Vanc 1/24>>1/25 Cefepime 1/25>>       Subjective: Patient is more alert.  Denies cp, n/vd, abd pain.  Has some sob.  Objective: Vitals:   08/28/21 0515 08/28/21 0600 08/28/21 0800 08/28/21 0858  BP:  122/86    Pulse:      Resp:  (!) 22    Temp:   98.6 F (37 C)   TempSrc:   Oral   SpO2:    97%  Weight: 63.3 kg     Height:        Intake/Output Summary (Last 24 hours) at 08/28/2021 1149 Last data filed at 08/27/2021 1852 Gross per 24 hour  Intake 480 ml  Output 1000 ml  Net -520 ml   Weight change: 1.6 kg Exam:  General:  Pt is alert, follows commands appropriately, not in acute distress HEENT: No icterus, No thrush, No neck mass, [place]/AT Cardiovascular: RRR, S1/S2, no rubs, no gallops Respiratory: bilateral crackles. No wheeze Abdomen: Soft/+BS, non tender, non distended, no guarding Extremities: trace LE edema, No lymphangitis, No petechiae, No rashes, no synovitis   Data Reviewed: I have personally reviewed following labs and imaging studies Basic Metabolic Panel: Recent Labs  Lab 08/22/21 0515 08/23/21 0543 08/03/2021 1752 08/26/21 0406 08/27/21 0624 08/28/21 0603  NA 135 138 142 139 140 138  K 3.3* 3.9 4.1 3.4* 2.7* 2.6*  CL 110 111 108 112* 111 110  CO2 16* 18* 21* 20* 20* 20*  GLUCOSE 93 92 111* 130* 132* 119*  BUN _0 CREATININE 1.01* 0.85 1.02* 0.80 0.76 0.78  CALCIUM 7.7* 8.8* 9.8 8.7* 9.5 8.7*  MG 1.6* 1.9  --  1.5* 2.1 1.7  PHOS 2.2* 2.4*  --   --   --   --    Liver Function Tests: Recent Labs   Lab 08/22/2021 1752 08/26/21 0406 08/27/21 0624  AST 54* 34 40  ALT _1 ALKPHOS 424* 255* 277*  BILITOT 1.2 0.8 0.6  PROT 6.4* 4.8* 5.3*  ALBUMIN 1.7* 1.6* 1.8*   No results for input(s): LIPASE, AMYLASE in the last 168 hours. Recent Labs  Lab 08/17/2021 1752 08/27/21 0624  AMMONIA 44* 23   Coagulation Profile: Recent Labs  Lab 08/26/21 0406  INR 1.5*   CBC: Recent Labs  Lab 08/23/21 0543 08/24/2021 1752 08/26/21 0406 08/27/21 0624 08/28/21 0603  WBC 13.1* 13.6* 10.8* 8.4 9.5  NEUTROABS  --  12.2* 10.4*  --   --   HGB 9.9* 10.4* 7.5* 8.6* 8.4*  HCT 28.9* 31.0* 23.3* 26.8* 25.5*  MCV 95.4 97.8 98.3 97.8 98.8  PLT 244 287 214 265 281   Cardiac Enzymes: No results for input(s): CKTOTAL, CKMB, CKMBINDEX, TROPONINI in the last 168 hours. BNP: Invalid input(s): POCBNP CBG: No results for input(s): GLUCAP in the last 168 hours. HbA1C: No results for input(s): HGBA1C in the last 72 hours. Urine analysis:    Component Value Date/Time   COLORURINE AMBER (A) 08/30/2021 1721   APPEARANCEUR HAZY (A) 08/23/2021 1721   LABSPEC 1.016 08/14/2021 1721   PHURINE 5.0 08/28/2021 1721   GLUCOSEU NEGATIVE 08/04/2021 1721  HGBUR NEGATIVE 08/31/2021 1721   BILIRUBINUR NEGATIVE 08/07/2021 1721   [place] 08/29/2021 1721   PROTEINUR 30 (A) 08/15/2021 1721   UROBILINOGEN 0.2 04/01/2015 1140   NITRITE POSITIVE (A) 08/24/2021 1721   LEUKOCYTESUR MODERATE (A) 08/06/2021 1721   Sepsis Labs: _0 (procalcitonin:4,lacticidven:4) ) Recent Results (from the past 240 hour(s))  Urine Culture     Status: Abnormal   Collection Time: 08/24/2021  3:30 AM   Specimen: Urine, Clean Catch  Result Value Ref Range Status   Specimen Description   Final    URINE, CLEAN CATCH Performed at [facility], [address] [place], [place] [address]    Special Requests   Final    NONE Performed at [facility], [address], [place], [place] [address]    Culture (A)  Final     >=100,000 COLONIES/mL MULTIPLE SPECIES PRESENT, SUGGEST RECOLLECTION   Report Status 08/27/2021 FINAL  Final  Culture, blood (single)     Status: None (Preliminary result)   Collection Time: 08/18/2021  5:52 PM   Specimen: BLOOD LEFT ARM  Result Value Ref Range Status   Specimen Description   Final    BLOOD LEFT ARM Performed at [facility], [address] [place], [place] [address]    Special Requests   Final    BOTTLES DRAWN AEROBIC AND ANAEROBIC Blood Culture adequate volume Performed at [facility], [address] [place], [place] [address]    Culture  Setup Time   Final    GRAM NEGATIVE RODS anaerobic bottle Gram Stain Report Called to,Read Back By and Verified With: Tinajero,E_1  by Zigmund Daniel, b 1.27.23    Culture PENDING  Incomplete   Report Status PENDING  Incomplete  Blood Culture ID Panel (Reflexed)     Status: None   Collection Time: 08/03/2021  5:52 PM  Result Value Ref Range Status   Enterococcus faecalis NOT DETECTED NOT DETECTED Final   Enterococcus Faecium NOT DETECTED NOT DETECTED Final   Listeria monocytogenes NOT DETECTED NOT DETECTED Final   Staphylococcus species NOT DETECTED NOT DETECTED Final   Staphylococcus aureus (BCID) NOT DETECTED NOT DETECTED Final   Staphylococcus epidermidis NOT DETECTED NOT DETECTED Final   Staphylococcus lugdunensis NOT DETECTED NOT DETECTED Final   Streptococcus species NOT DETECTED NOT DETECTED Final   Streptococcus agalactiae NOT DETECTED NOT DETECTED Final   Streptococcus pneumoniae NOT DETECTED NOT DETECTED Final   Streptococcus pyogenes NOT DETECTED NOT DETECTED Final   A.calcoaceticus-baumannii NOT DETECTED NOT DETECTED Final   Bacteroides fragilis NOT DETECTED NOT DETECTED Final   Enterobacterales NOT DETECTED NOT DETECTED Final   Enterobacter cloacae complex NOT DETECTED NOT DETECTED Final   Escherichia coli NOT DETECTED NOT DETECTED Final   Klebsiella aerogenes NOT DETECTED NOT DETECTED Final   Klebsiella oxytoca NOT  DETECTED NOT DETECTED Final   Klebsiella pneumoniae NOT DETECTED NOT DETECTED Final   Proteus species NOT DETECTED NOT DETECTED Final   Salmonella species NOT DETECTED NOT DETECTED Final   Serratia marcescens NOT DETECTED NOT DETECTED Final   Haemophilus influenzae NOT DETECTED NOT DETECTED Final   Neisseria meningitidis NOT DETECTED NOT DETECTED Final   Pseudomonas aeruginosa NOT DETECTED NOT DETECTED Final   Stenotrophomonas maltophilia NOT DETECTED NOT DETECTED Final   Candida albicans NOT DETECTED NOT DETECTED Final   Candida auris NOT DETECTED NOT DETECTED Final   Candida glabrata NOT DETECTED NOT DETECTED Final   Candida krusei NOT DETECTED NOT DETECTED Final   Candida parapsilosis NOT DETECTED NOT DETECTED Final  Candida tropicalis NOT DETECTED NOT DETECTED Final   Cryptococcus neoformans/gattii NOT DETECTED NOT DETECTED Final    Comment: Performed at [facility], [place] [address], [place], [place] [address]  Resp Panel by RT-PCR (Flu A&B, Covid) Nasopharyngeal Swab     Status: None   Collection Time: 08/24/2021  7:14 PM   Specimen: Nasopharyngeal Swab; Nasopharyngeal(NP) swabs in vial transport medium  Result Value Ref Range Status   SARS Coronavirus 2 by RT PCR NEGATIVE NEGATIVE Final    Comment: (NOTE) SARS-CoV-2 target nucleic acids are NOT DETECTED.  The SARS-CoV-2 RNA is generally detectable in upper respiratory specimens during the acute phase of infection. The lowest concentration of SARS-CoV-2 viral copies this assay can detect is 138 copies/mL. A negative result does not preclude SARS-Cov-2 infection and should not be used as the sole basis for treatment or other patient management decisions. A negative result may occur with  improper specimen collection/handling, submission of specimen other than nasopharyngeal swab, presence of viral mutation(s) within the areas targeted by this assay, and inadequate number of viral copies(<138 copies/mL). A negative result  must be combined with clinical observations, patient history, and epidemiological information. The expected result is Negative.  Fact Sheet for Patients:  EntrepreneurPulse.com.au  Fact Sheet for Healthcare Providers:  IncredibleEmployment.be  This test is no t yet approved or cleared by the [place] FDA and  has been authorized for detection and/or diagnosis of SARS-CoV-2 by FDA under an Emergency Use Authorization (EUA). This EUA will remain  in effect (meaning this test can be used) for the duration of the COVID-19 declaration under Section 564(b)(1) of the Act, 21 U.S.C.section 360bbb-3(b)(1), unless the authorization is terminated  or revoked sooner.       Influenza A by PCR NEGATIVE NEGATIVE Final   Influenza B by PCR NEGATIVE NEGATIVE Final    Comment: (NOTE) The Xpert Xpress SARS-CoV-2/FLU/RSV plus assay is intended as an aid in the diagnosis of influenza from Nasopharyngeal swab specimens and should not be used as a sole basis for treatment. Nasal washings and aspirates are unacceptable for Xpert Xpress SARS-CoV-2/FLU/RSV testing.  Fact Sheet for Patients: EntrepreneurPulse.com.au  Fact Sheet for Healthcare Providers: IncredibleEmployment.be  This test is not yet approved or cleared by the [place] FDA and has been authorized for detection and/or diagnosis of SARS-CoV-2 by FDA under an Emergency Use Authorization (EUA). This EUA will remain in effect (meaning this test can be used) for the duration of the COVID-19 declaration under Section 564(b)(1) of the Act, 21 U.S.C. section 360bbb-3(b)(1), unless the authorization is terminated or revoked.  Performed at [facility], [address] [place], [place] [address]   MRSA Next Gen by PCR, Nasal     Status: None   Collection Time: 08/26/21 12:00 AM   Specimen: Nasal Mucosa; Nasal Swab  Result Value Ref Range Status   MRSA by PCR Next  Gen NOT DETECTED NOT DETECTED Final    Comment: (NOTE) The GeneXpert MRSA Assay (FDA approved for NASAL specimens only), is one component of a comprehensive MRSA colonization surveillance program. It is not intended to diagnose MRSA infection nor to guide or monitor treatment for MRSA infections. Test performance is not FDA approved in patients less than 64 years old. Performed at [facility], [address] [place], [place] [address]   C Difficile Quick Screen w PCR reflex     Status: None   Collection Time: 08/27/21 12:08 PM   Specimen: STOOL  Result Value Ref Range  Status   C Diff antigen NEGATIVE NEGATIVE Final   C Diff toxin NEGATIVE NEGATIVE Final   C Diff interpretation No C. difficile detected.  Final    Comment: Performed at [facility], [address], [place], [place] [address]     Scheduled Meds:  aspirin EC  81 mg Oral Daily   Chlorhexidine Gluconate Cloth  6 each Topical Daily   feeding supplement  237 mL Oral BID BM   folic acid  1 mg Oral Daily   furosemide  40 mg Intravenous Once   guaiFENesin  600 mg Oral BID   heparin  5,000 Units Subcutaneous Q8H   ipratropium-albuterol  3 mL Nebulization TID   mouth rinse  15 mL Mouth Rinse BID   metoprolol tartrate  5 mg Intravenous Q6H   pantoprazole  40 mg Oral BID   sodium bicarbonate  650 mg Oral TID   thiamine injection  100 mg Intravenous Daily   Continuous Infusions:  ceFEPime (MAXIPIME) IV 2 g (08/28/21 0518)    Procedures/Studies: CT ANGIO HEAD NECK W WO CM  Result Date: 08/17/2021 CLINICAL DATA:  Stroke/TIA, determine embolic source. Right frontoparietal and cerebellar infarcts as well as abnormal appearance of the right vertebral artery on MRI. EXAM: CT ANGIOGRAPHY HEAD AND NECK TECHNIQUE: Multidetector CT imaging of the head and neck was performed using the standard protocol during bolus administration of intravenous contrast. Multiplanar CT image reconstructions and MIPs were obtained to evaluate the  vascular anatomy. Carotid stenosis measurements (when applicable) are obtained utilizing NASCET criteria, using the distal internal carotid diameter as the denominator. RADIATION DOSE REDUCTION: This exam was performed according to the departmental dose-optimization program which includes automated exposure control, adjustment of the mA and/or kV according to patient size and/or use of iterative reconstruction technique. CONTRAST:  169m OMNIPAQUE IOHEXOL 350 MG/ML SOLN COMPARISON:  Head MRI 08/17/2021 FINDINGS: CT HEAD FINDINGS Brain: There is a small acute infarct superiorly in the right cerebellar hemisphere as shown on MRI. The subcentimeter right frontoparietal subcortical infarct on MRI is not well seen by CT. No intracranial hemorrhage, mass, midline shift, or extra-axial fluid collection is identified. There is mild cerebral atrophy. Periventricular white matter hypodensities are nonspecific but compatible with mild chronic small vessel ischemic disease. Vascular: Calcified atherosclerosis at the skull base. Skull: No fracture or suspicious osseous lesion. Sinuses: Unchanged fluid in the right sphenoid sinus. Trace right mastoid fluid. Orbits: Bilateral cataract extraction. Review of the MIP images confirms the above findings CTA NECK FINDINGS Aortic arch: Normal variant aortic arch branching pattern with common origin of the brachiocephalic and left common carotid arteries. Widely patent arch vessel origins. Right carotid system: Patent with a small amount calcified plaque at the carotid bifurcation. No evidence of a significant stenosis or dissection. Left carotid system: Patent with minimal atherosclerotic plaque at the carotid bifurcation and possible tiny web laterally in the proximal aspect of the carotid bulb. No evidence of a significant stenosis or dissection. Vertebral arteries: The vertebral arteries are patent without evidence of a significant stenosis or dissection of the left vertebral artery  which is strongly dominant. The right vertebral artery is diffusely small in caliber with a superimposed severe stenosis at its origin. Skeleton: Mild disc and moderate facet degeneration in the cervical spine. Other neck: No evidence of cervical lymphadenopathy or mass. Upper chest: Centrilobular emphysema. Partially visualized right PICC. Review of the MIP images confirms the above findings CTA HEAD FINDINGS Anterior circulation: Internal carotid arteries are patent from skull  base to carotid termini. The cavernous carotid segments are tortuous with atherosclerotic calcification greater on the left not resulting in significant stenosis. ACAs and MCAs are patent without evidence of a proximal branch occlusion or significant proximal stenosis. No aneurysm is identified. Posterior circulation: The intracranial vertebral arteries are patent to the basilar with mild nonstenotic calcified plaque on the left. There is moderate to severe stenosis of the right vertebral artery proximal to the PICA origin. Patent PICA and SCA origins are seen bilaterally. The basilar artery is widely patent and tortuous. Posterior communicating arteries are diminutive or absent. Both PCAs are patent without evidence of a significant proximal stenosis. No aneurysm is identified. Venous sinuses: As permitted by contrast timing, patent. Anatomic variants: None. Review of the MIP images confirms the above findings IMPRESSION: 1. No large vessel occlusion. 2. 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 3. No significant stenosis of the strongly dominant left vertebral artery. 4. Minimal cervical carotid artery atherosclerosis without significant stenosis. 5. Emphysema (ICD10-J43.9). Electronically Signed   By: Logan Bores M.D.   On: 08/17/2021 14:53   CT HEAD WO CONTRAST  Result Date: 08/27/2021 CLINICAL DATA:  Mental status change, unknown cause EXAM: CT HEAD WITHOUT CONTRAST TECHNIQUE:  Contiguous axial images were obtained from the base of the skull through the vertex without intravenous contrast. RADIATION DOSE REDUCTION: This exam was performed according to the departmental dose-optimization program which includes automated exposure control, adjustment of the mA and/or kV according to patient size and/or use of iterative reconstruction technique. COMPARISON:  08/17/2021 FINDINGS: Brain: There is atrophy and chronic small vessel disease changes. No acute intracranial abnormality. Specifically, no hemorrhage, hydrocephalus, mass lesion, acute infarction, or significant intracranial injury. Vascular: No hyperdense vessel or unexpected calcification. Skull: No acute calvarial abnormality. Sinuses/Orbits: No acute findings Other: None IMPRESSION: Atrophy, chronic microvascular disease. No acute intracranial abnormality. Electronically Signed   By: Rolm Baptise M.D.   On: 08/21/2021 18:45   CT Head Wo Contrast  Result Date: 08/04/2021 CLINICAL DATA:  Provided history: Neuro deficit, acute, stroke suspected. Mental status change, unknown cause. Slurred speech. EXAM: CT HEAD WITHOUT CONTRAST TECHNIQUE: Contiguous axial images were obtained from the base of the skull through the vertex without intravenous contrast. COMPARISON:  Brain MRI 07/13/2021.  Head CT 05/13/2014. FINDINGS: Brain: Mild generalized cerebral atrophy. Mild patchy and ill-defined hypoattenuation within the cerebral white matter, nonspecific but compatible with chronic small vessel ischemic disease. There is no acute intracranial hemorrhage. No demarcated cortical infarct. No extra-axial fluid collection. No evidence of an intracranial mass. No midline shift. Vascular: No hyperdense vessel.  Atherosclerotic calcifications. Skull: Normal. Negative for fracture or focal lesion. Sinuses/Orbits: Visualized orbits show no acute finding. No significant paranasal sinus disease at the imaged levels. IMPRESSION: No evidence of acute  intracranial abnormality. Mild chronic small vessel ischemic changes within the cerebral white matter. Mild generalized cerebral atrophy. Electronically Signed   By: Kellie Simmering D.O.   On: 08/04/2021 14:14   MR BRAIN WO CONTRAST  Result Date: 08/17/2021 CLINICAL DATA:  Provided history: Delirium. EXAM: MRI HEAD WITHOUT CONTRAST TECHNIQUE: Multiplanar, multiecho pulse sequences of the brain and surrounding structures were obtained without intravenous contrast. COMPARISON:  Prior head CT examinations 08/13/2021 and earlier. Brain MRI 07/13/2021. FINDINGS: Brain: Intermittently motion degraded examination, limiting evaluation. Most notably, there is severe motion degradation of the sagittal T1 weighted sequence, severe motion degradation of the axial T1 weighted sequence and severe motion degradation of the  coronal T2 TSE sequence. Mild to moderate generalized cerebral atrophy. Comparatively mild cerebellar atrophy. 5 mm acute/early subacute infarct within the left frontoparietal subcortical white matter (series 5, image 25) (series 7, image 12). 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 Chronic small vessel ischemic changes which are mild in cerebral white matter, and mild to moderate in the pons. No evidence of an intracranial mass. No chronic intracranial blood products. No extra-axial fluid collection. No midline shift. Vascular: Similar to the prior brain MRI of 07/13/2021, there is signal abnormality within the intracranial right vertebral artery suggesting high-grade stenosis or vessel occlusion. Skull and upper cervical spine: Within described limitations, no focal suspicious marrow lesion is identified. Sinuses/Orbits: Visualized orbits show no acute finding. Large fluid level, and background mild mucosal thickening, within the right sphenoid sinus. Other: Trace fluid within the bilateral mastoid air cells.  IMPRESSION: Intermittently motion degraded examination, as described and limiting evaluation. 5 mm acute/early subacute infarct within the right frontoparietal subcortical white matter. 1.8 x 0.7 cm acute infarct within the superior right cerebellar hemisphere. Background chronic small vessel ischemic changes which are mild in the cerebral white matter, and mild-to-moderate in the pons, stable from the brain MRI of 07/13/2021. Similar to the prior MRI, there is signal abnormality within the intracranial right vertebral artery suggesting high-grade stenosis or vessel occlusion. MR or CT angiography may be obtained for further evaluation, as clinically warranted. Mild-to-moderate generalized cerebral atrophy. Comparatively mild cerebellar atrophy. Right sphenoid sinusitis. Electronically Signed   By: Kellie Simmering D.O.   On: 08/17/2021 09:35   CT ABDOMEN PELVIS W CONTRAST  Result Date: 08/27/2021 CLINICAL DATA:  Acute abdominal pain.  Sepsis.  Cirrhosis. EXAM: CT ABDOMEN AND PELVIS WITH CONTRAST TECHNIQUE: Multidetector CT imaging of the abdomen and pelvis was performed using the standard protocol following bolus administration of intravenous contrast. RADIATION DOSE REDUCTION: This exam was performed according to the departmental dose-optimization program which includes automated exposure control, adjustment of the mA and/or kV according to patient size and/or use of iterative reconstruction technique. CONTRAST:  175m OMNIPAQUE IOHEXOL 300 MG/ML  SOLN COMPARISON:  07/13/2021 FINDINGS: Lower Chest: Bibasilar pulmonary airspace opacity is seen, which may be due to pulmonary edema, ARDS, or pneumonia. Small bilateral pleural effusions are also seen. Hepatobiliary: Severe diffuse hepatic steatosis is again seen, although there are no gross morphologic changes of cirrhosis. Gallbladder is unremarkable. No evidence of biliary ductal dilatation. No hepatic masses identified. Pancreas:  No mass or inflammatory changes.  Spleen: Within normal limits in size and appearance. Adrenals/Urinary Tract: No masses identified. Small peripheral areas of decreased enhancement are seen in the mid and lower poles of the right kidney which were not seen on previous study and may be due to small renal infarcts or pyelonephritis. No evidence of ureteral calculi or hydronephrosis. Unremarkable unopacified urinary bladder. Stomach/Bowel: No evidence of obstruction, inflammatory process or abnormal fluid collections. Normal appendix visualized. Vascular/Lymphatic: No pathologically enlarged lymph nodes. No acute vascular findings. Aortic atherosclerotic calcification noted. Reproductive: Several small calcified uterine fibroids are seen, largest measuring 2.0 cm. No adnexal masses are identified. Small amount of free fluid is seen in the pelvis. Other:  Mild diffuse body wall edema noted. Musculoskeletal:  No suspicious bone lesions identified. IMPRESSION: New small peripheral areas of decreased enhancement in right kidney, which may be due to small renal infarcts or pyelonephritis. Recommend correlation with urinalysis. Severe hepatic steatosis. Small calcified uterine fibroids.  Small amount of pelvic ascites and diffuse body wall edema. Bibasilar pulmonary airspace opacity, with small bilateral pleural effusions. Differential diagnosis includes pulmonary edema, ARDS, and pneumonia. Aortic Atherosclerosis (ICD10-I70.0). Electronically Signed   By: Marlaine Hind M.D.   On: 08/27/2021 14:46   DG CHEST PORT 1 VIEW  Result Date: 08/27/2021 CLINICAL DATA:  Altered mental status, shortness of breath EXAM: PORTABLE CHEST 1 VIEW COMPARISON:  Previous studies including the examination of 08/19/2021 FINDINGS: Diffuse increased interstitial markings are seen in the left lung with possible slight improvement. There is new interstitial and alveolar infiltrate in the right upper lung fields. Costophrenic angles are clear. There is no pneumothorax. IMPRESSION:  There is new large area of interstitial and alveolar infiltrate in the right upper lung fields suggesting pneumonia. There is slight improvement in aeration of left lung, possibly suggesting resolving pneumonia. There is no pleural effusion or pneumothorax. Electronically Signed   By: Elmer Picker M.D.   On: 08/27/2021 13:07   DG Chest Port 1 View  Result Date: 08/30/2021 CLINICAL DATA:  Altered mental status EXAM: PORTABLE CHEST 1 VIEW COMPARISON:  08/16/2021, CT 09/27/2020 FINDINGS: Interval development of diffuse ground-glass and interstitial opacity throughout left thorax with patchy airspace disease in the right lower lung. No pleural effusion. Normal cardiomediastinal silhouette. No pneumothorax. IMPRESSION: Interval development of patchy right lower lung airspace disease and diffuse interstitial and ground-glass opacity throughout left thorax, suspect that findings are second bilateral infection/pneumonia. Electronically Signed   By: Donavan Foil M.D.   On: 08/31/2021 17:25   DG CHEST PORT 1 VIEW  Result Date: 08/16/2021 CLINICAL DATA:  The reason for exam is stated as dyspnea. Hx of asthma, HTN, and current smoker of 1 pack a day x 40 years. Negative covid-19 test x 3 days ago. EXAM: PORTABLE CHEST 1 VIEW COMPARISON:  Chest x-ray 07/09/2021 acidosis chest 09/27/2000 FINDINGS: Right PICC line with tip overlying the expected region of the superior cavoatrial junction. The heart and mediastinal contours are unchanged. No focal consolidation. No pulmonary edema. No pleural effusion. No pneumothorax. No acute osseous abnormality. IMPRESSION: No active disease. Electronically Signed   By: Iven Finn M.D.   On: 08/16/2021 19:46   EEG adult  Result Date: 08/17/2021 Tsosie Billing, MD     08/17/2021 10:12 PM [facility] Routine EEG Report Patient Name:   Reinaldo Berber Date of Birth:   1956-03-21 Identification Number:   MRN - [id number]  Date of Study:   08/17/2021 17:49:17 Duration: 23 min Indication: Encephalopathy, Technical Summary: A routine 20 channel electroencephalogram using the international 10-20 system of electrode placement was performed. Background: 5-6 Hz, Poorly formed States      Drowsy: were seen during drowsiness Abnormalities Generalized Slowing: Diffuse generalized slowing Activation Procedures Hyperventilation: Not performed Photic Stimulation: Not performed Classification: Abnormal : Diagnosis: This is abnormal EEG, the Presence of Generalized slowing is consistent with Encephalopathy Dr Tsosie Billing [facility] [phone] Case [id number]  ECHOCARDIOGRAM COMPLETE  Result Date: 08/17/2021    ECHOCARDIOGRAM REPORT   Patient Name:   DEZAREE TRACEY [facility] Date of Exam: 08/17/2021 Medical Rec #:  [id number]      Height:       66.0 in Accession #:    [id number]     Weight:       127.0 lb Date of Birth:  [birth year]     BSA:          1.649 m Patient Age:    56 years  BP:           122/94 mmHg Patient Gender: F              HR:           101 bpm. Exam Location:  [facility] Procedure: 2D Echo, Cardiac Doppler and Color Doppler Indications:    Stroke  History:        Patient has prior history of Echocardiogram examinations, most                 recent 07/06/2019. Risk Factors:Hypertension. ETOH Abuse.  Sonographer:    Wenda Low Referring Phys: [place]  1. Left ventricular ejection fraction, by estimation, is 65 to 70%. The left ventricle has normal function. The left ventricle has no regional wall motion abnormalities. There is mild left ventricular hypertrophy. Left ventricular diastolic parameters are consistent with Grade I diastolic dysfunction (impaired relaxation).  2. Right ventricular systolic function is normal. The right ventricular size is normal. There is mildly elevated pulmonary artery systolic pressure.  3. The mitral valve is normal in structure. No evidence of mitral valve  regurgitation. No evidence of mitral stenosis.  4. The aortic valve has an indeterminant number of cusps. Aortic valve regurgitation is not visualized. No aortic stenosis is present. FINDINGS  Left Ventricle: Left ventricular ejection fraction, by estimation, is 65 to 70%. The left ventricle has normal function. The left ventricle has no regional wall motion abnormalities. The left ventricular internal cavity size was normal in size. There is  mild left ventricular hypertrophy. Left ventricular diastolic parameters are consistent with Grade I diastolic dysfunction (impaired relaxation). Normal left ventricular filling pressure. Right Ventricle: The right ventricular size is normal. No increase in right ventricular wall thickness. Right ventricular systolic function is normal. There is mildly elevated pulmonary artery systolic pressure. The tricuspid regurgitant velocity is 2.82  m/s, and with an assumed right atrial pressure of 8 mmHg, the estimated right ventricular systolic pressure is 16.3 mmHg. Left Atrium: Left atrial size was normal in size. Right Atrium: Right atrial size was normal in size. Pericardium: There is no evidence of pericardial effusion. Mitral Valve: The mitral valve is normal in structure. No evidence of mitral valve regurgitation. No evidence of mitral valve stenosis. MV peak gradient, 8.0 mmHg. The mean mitral valve gradient is 3.0 mmHg. Tricuspid Valve: The tricuspid valve is not well visualized. Tricuspid valve regurgitation is mild . No evidence of tricuspid stenosis. Aortic Valve: The aortic valve has an indeterminant number of cusps. Aortic valve regurgitation is not visualized. No aortic stenosis is present. Aortic valve mean gradient measures 3.0 mmHg. Aortic valve peak gradient measures 5.6 mmHg. Aortic valve area, by VTI measures 2.20 cm. Pulmonic Valve: The pulmonic valve was not well visualized. Pulmonic valve regurgitation is not visualized. No evidence of pulmonic stenosis.  Aorta: The aortic root is normal in size and structure. IAS/Shunts: No atrial level shunt detected by color flow Doppler.  LEFT VENTRICLE PLAX 2D LVIDd:         3.10 cm     Diastology LVIDs:         2.00 cm     LV e' medial:    5.66 cm/s LV PW:         1.20 cm     LV E/e' medial:  11.8 LV IVS:        1.30 cm     LV e' lateral:   6.85 cm/s LVOT diam:  1.80 cm     LV E/e' lateral: 9.8 LV SV:         47 LV SV Index:   29 LVOT Area:     2.54 cm  LV Volumes (MOD) LV vol d, MOD A2C: 20.3 ml LV vol d, MOD A4C: 14.2 ml LV vol s, MOD A2C: 8.2 ml LV vol s, MOD A4C: 5.8 ml LV SV MOD A2C:     12.1 ml LV SV MOD A4C:     14.2 ml LV SV MOD BP:      10.1 ml RIGHT VENTRICLE RV Basal diam:  3.20 cm RV Mid diam:    2.80 cm RV S prime:     17.30 cm/s LEFT ATRIUM             Index        RIGHT ATRIUM           Index LA diam:        3.10 cm 1.88 cm/m   RA Area:     11.50 cm LA Vol (A2C):   30.9 ml 18.74 ml/m  RA Volume:   26.20 ml  15.89 ml/m LA Vol (A4C):   17.0 ml 10.31 ml/m LA Biplane Vol: 25.1 ml 15.22 ml/m  AORTIC VALVE                    PULMONIC VALVE AV Area (Vmax):    2.48 cm     PV Vmax:       0.75 m/s AV Area (Vmean):   2.42 cm     PV Peak grad:  2.3 mmHg AV Area (VTI):     2.20 cm AV Vmax:           118.00 cm/s AV Vmean:          79.000 cm/s AV VTI:            0.214 m AV Peak Grad:      5.6 mmHg AV Mean Grad:      3.0 mmHg LVOT Vmax:         115.00 cm/s LVOT Vmean:        75.100 cm/s LVOT VTI:          0.185 m LVOT/AV VTI ratio: 0.86  AORTA Ao Root diam: 3.10 cm Ao Asc diam:  3.20 cm MITRAL VALVE                TRICUSPID VALVE MV Area (PHT): 5.97 cm     TR Peak grad:   31.8 mmHg MV Area VTI:   2.54 cm     TR Vmax:        282.00 cm/s MV Peak grad:  8.0 mmHg MV Mean grad:  3.0 mmHg     SHUNTS MV Vmax:       1.41 m/s     Systemic VTI:  0.18 m MV Vmean:      75.5 cm/s    Systemic Diam: 1.80 cm MV Decel Time: 127 msec MV E velocity: 66.80 cm/s MV A velocity: 112.00 cm/s MV E/A ratio:  0.60 Carlyle Dolly MD  Electronically signed by Carlyle Dolly MD Signature Date/Time: 08/17/2021/3:31:03 PM    Final    CT HEAD CODE STROKE WO CONTRAST  Result Date: 08/13/2021 CLINICAL DATA:  Code stroke. Initial evaluation for acute stroke, left facial droop. EXAM: CT HEAD WITHOUT CONTRAST TECHNIQUE: Contiguous axial images were obtained from the base of the skull through the vertex without intravenous contrast. RADIATION  DOSE REDUCTION: This exam was performed according to the departmental dose-optimization program which includes automated exposure control, adjustment of the mA and/or kV according to patient size and/or use of iterative reconstruction technique. COMPARISON:  Prior head CT from 08/04/2021. FINDINGS: Brain: Moderately advanced cerebral atrophy with chronic small vessel ischemic disease. No acute intracranial hemorrhage. No acute large vessel territory infarct. No mass lesion, midline shift or mass effect. No hydrocephalus or extra-axial fluid collection. Vascular: No hyperdense vessel. Calcified atherosclerosis present at the skull base. Skull: Scalp soft tissues and calvarium within normal limits. Sinuses/Orbits: Globes and orbital soft tissues demonstrate no acute finding. Visualized paranasal sinuses and mastoid air cells are clear. Other: None. ASPECTS [facility] Stroke Program Early CT Score) - Ganglionic level infarction (caudate, lentiform nuclei, internal capsule, insula, M1-M3 cortex): 7 - Supraganglionic infarction (M4-M6 cortex): 3 Total score (0-10 with 10 being normal): 10 IMPRESSION: 1. No acute intracranial infarct or other abnormality. 2. ASPECTS is 10. 3. Moderately advanced cerebral atrophy with chronic small vessel ischemic disease. Results were called by telephone at the time of interpretation on 08/13/2021 at 10:52 pm to provider [facility] , who verbally acknowledged these results. Electronically Signed   By: Jeannine Boga M.D.   On: 08/13/2021 22:52   [place] EKG SITE RITE  Result Date:  08/14/2021 If Site Rite image not attached, placement could not be confirmed due to current cardiac rhythm.   Orson Eva, DO  [facility]  If 7PM-7AM, please contact night-coverage [email or web address] Password [facility] 08/28/2021, 11:49 AM   LOS: 3 days

## 2021-08-28 NOTE — Care Management Important Message (Signed)
Important Message  Patient Details  Name: Colleen Lee MRN: [id number] Date of Birth: 03-17-1956   Medicare Important Message Given:  Yes     Tommy Medal 08/28/2021, 11:05 AM

## 2021-08-28 NOTE — Progress Notes (Signed)
Date and time results received: 08/28/21 0645 (use smartphrase ".now" to insert current time)  Test: Potassium Critical Value: 2.6  Name of Provider Notified: [place]  Orders Received? Or Actions Taken?:  No orders at this time.

## 2021-08-28 NOTE — TOC Progression Note (Signed)
Transition of Care [facility]) - Progression Note    Patient Details  Name: Colleen Lee MRN: [id number] Date of Birth: 10/17/1955  Transition of Care [facility]) CM/SW Contact  Salome Arnt, [place] Phone Number: 08/28/2021, 11:20 AM  Clinical Narrative:  LCSW updated Debbie at [facility]. Jackelyn Poling is aware of [facility] authorization waiver and states she will submit once pt returns. TOC will continue to follow.      Expected Discharge Plan: [place] Barriers to Discharge: Continued Medical Work up  Expected Discharge Plan and Services Expected Discharge Plan: [place] In-house Referral: Clinical Social Work   Post Acute Care Choice: [place] Living arrangements for the past 2 months: [place], Single Family Home                                       Social Determinants of Health (SDOH) Interventions    Readmission Risk Interventions Readmission Risk Prevention Plan 08/14/2021 07/16/2021 05/19/2021  Transportation Screening Complete Complete Complete  HRI or Home Care Consult - - Complete  Social Work Consult for [place] Planning/Counseling - - Complete  Palliative Care Screening - - Not Applicable  Medication Review Press photographer) Complete Complete Complete  HRI or Home Care Consult Complete Complete -  SW Recovery Care/Counseling Consult Complete Complete -  Palliative Care Screening - Not Applicable -  [place] Complete Complete -  Some recent data might be hidden

## 2021-08-28 NOTE — Consult Note (Signed)
[facility]   Patient name: Colleen Lee Admit date: 08/06/2021  DOB: 06-11-1956 LOS: 3  MRN: [id number] Consult date: 08/28/2021  Referring provider: Dr. Carles Collet, [facility] CC: Sepsis    History:  66 yo female smoker was in hospital from 08/13/21 to 08/23/21 for acute encephalopathy.  She presented to [facility] ER with altered mental status.  Tm 100.7.  She was found to sepsis GNR bacteremia with UTI and HCAP.  PCCM asked to assist with ICU management.  Hx from pt's sister, medical team, and chart.  Past medical history:  Anemia, Constipation, Asthma, Hep C, Fibromyalgia, GERD, HTN  Significant events:  1/24 Admit   Studies:  CT head 1/24 >> atrophy, chronic microvascular disease CT abd/pelvis 1/26 >> b/l ASD, severe fatty liver, small lesion Rt kidney (pyelonephritis or small renal infarct)  Micro:  COVID/Flu 1/24 >> negative Urine 1/24 >> multiple species Blood 1/24 >> GNR >> C diff 1/26 >> negative  Lines:     Antibiotics:  Vancomycin 1/24 Cefepime 1/24 >>   Consults:      Interim history:    Vital signs:  BP 122/86    Pulse (!) 118    Temp 98.6 F (37 C) (Oral)    Resp (!) 22    Ht 5' 6" (1.676 m)    Wt 63.3 kg    SpO2 97%    BMI 22.52 kg/m   Intake/output:  I/O last 3 completed shifts: In: 1200 [P.O.:1200] Out: 1000 [Urine:1000]   Physical exam:   General - somnolent Eyes - pupils reactive ENT - oxygen mask on Cardiac - regular, tachycardic Chest - b/l rales Abdomen - soft, non tender, + bowel sounds Extremities - no cyanosis, clubbing, or edema Skin - sacral wound Neuro - wakes up easily, follows simple commands, moves extremities  Best practice:   DVT - heparin SUP - protonix Nutrition - D3 diet   Assessment/plan:   Acute hypoxic respiratory failure 2nd to pneumonia (possible from aspiration). Tobacco abuse with hx of asthma. - continue ABx - f/u CXR intermittently  - goal SpO2 > 90% -  monitor need for intubation in ICU - scheduled duoneb  Sepsis with lactic acidosis from UTI with possible Rt pyelonephritis and PNA. GNR in blood culture from 1/24. - day 4 of cefepime - f/u blood culture results  Acute metabolic encephalopathy from sepsis. - monitor mental status  Hypokalemia. - f/u BMET  Anemia of critical illness and chronic disease. - f/u CBC - transfuse for Hb < 7  Pressure injuries. - sacrum, stage 4 (POA) - Lt medial heel, deep tissue (POA) - wound care  Resolved hospital problems:    Goals of care/Family discussions:  Code status: full code  Updated her sister, Tim Lair, at bedside.  Tim Lair reports she is POA.  Labs:   CMP Latest Ref Rng & Units 08/28/2021 08/27/2021 08/26/2021  Glucose 70 - 99 mg/dL 119(H) 132(H) 130(H)  BUN 8 - 23 mg/dL _0 Creatinine 0.44 - 1.00 mg/dL 0.78 0.76 0.80  Sodium 135 - 145 mmol/L 138 140 139  Potassium 3.5 - 5.1 mmol/L 2.6(LL) 2.7(LL) 3.4(L)  Chloride 98 - 111 mmol/L 110 111 112(H)  CO2 22 - 32 mmol/L 20(L) 20(L) 20(L)  Calcium 8.9 - 10.3 mg/dL 8.7(L) 9.5 8.7(L)  Total Protein 6.5 -  [facility]   Patient name: Colleen Lee Admit date: 08/06/2021  DOB: 06-11-1956 LOS: 3  MRN: [id number] Consult date: 08/28/2021  Referring provider: Dr. Carles Collet, [facility] CC: Sepsis    History:  66 yo female smoker was in hospital from 08/13/21 to 08/23/21 for acute encephalopathy.  She presented to [facility] ER with altered mental status.  Tm 100.7.  She was found to sepsis GNR bacteremia with UTI and HCAP.  PCCM asked to assist with ICU management.  Hx from pt's sister, medical team, and chart.  Past medical history:  Anemia, Constipation, Asthma, Hep C, Fibromyalgia, GERD, HTN  Significant events:  1/24 Admit   Studies:  CT head 1/24 >> atrophy, chronic microvascular disease CT abd/pelvis 1/26 >> b/l ASD, severe fatty liver, small lesion Rt kidney (pyelonephritis or small renal infarct)  Micro:  COVID/Flu 1/24 >> negative Urine 1/24 >> multiple species Blood 1/24 >> GNR >> C diff 1/26 >> negative  Lines:     Antibiotics:  Vancomycin 1/24 Cefepime 1/24 >>   Consults:      Interim history:    Vital signs:  BP 122/86    Pulse (!) 118    Temp 98.6 F (37 C) (Oral)    Resp (!) 22    Ht 5' 6" (1.676 m)    Wt 63.3 kg    SpO2 97%    BMI 22.52 kg/m   Intake/output:  I/O last 3 completed shifts: In: 1200 [P.O.:1200] Out: 1000 [Urine:1000]   Physical exam:   General - somnolent Eyes - pupils reactive ENT - oxygen mask on Cardiac - regular, tachycardic Chest - b/l rales Abdomen - soft, non tender, + bowel sounds Extremities - no cyanosis, clubbing, or edema Skin - sacral wound Neuro - wakes up easily, follows simple commands, moves extremities  Best practice:   DVT - heparin SUP - protonix Nutrition - D3 diet   Assessment/plan:   Acute hypoxic respiratory failure 2nd to pneumonia (possible from aspiration). Tobacco abuse with hx of asthma. - continue ABx - f/u CXR intermittently  - goal SpO2 > 90% -  monitor need for intubation in ICU - scheduled duoneb  Sepsis with lactic acidosis from UTI with possible Rt pyelonephritis and PNA. GNR in blood culture from 1/24. - day 4 of cefepime - f/u blood culture results  Acute metabolic encephalopathy from sepsis. - monitor mental status  Hypokalemia. - f/u BMET  Anemia of critical illness and chronic disease. - f/u CBC - transfuse for Hb < 7  Pressure injuries. - sacrum, stage 4 (POA) - Lt medial heel, deep tissue (POA) - wound care  Resolved hospital problems:    Goals of care/Family discussions:  Code status: full code  Updated her sister, Tim Lair, at bedside.  Tim Lair reports she is POA.  Labs:   CMP Latest Ref Rng & Units 08/28/2021 08/27/2021 08/26/2021  Glucose 70 - 99 mg/dL 119(H) 132(H) 130(H)  BUN 8 - 23 mg/dL _0 Creatinine 0.44 - 1.00 mg/dL 0.78 0.76 0.80  Sodium 135 - 145 mmol/L 138 140 139  Potassium 3.5 - 5.1 mmol/L 2.6(LL) 2.7(LL) 3.4(L)  Chloride 98 - 111 mmol/L 110 111 112(H)  CO2 22 - 32 mmol/L 20(L) 20(L) 20(L)  Calcium 8.9 - 10.3 mg/dL 8.7(L) 9.5 8.7(L)  Total Protein 6.5 -  [facility] ° ° °Patient name: Kalley D Garland Admit date: 08/17/2021  °DOB: 04/27/1956 LOS: 3  °MRN: [id number] Consult date: 08/28/2021  °Referring provider: Dr. Tat, [facility] CC: Sepsis  ° ° °History:  °65 yo female smoker was in hospital from 08/13/21 to 08/23/21 for acute encephalopathy.  She presented to [facility] ER with altered mental status.  Tm 100.7.  She was found to sepsis GNR bacteremia with UTI and HCAP.  PCCM asked to assist with ICU management. ° °Hx from pt's sister, medical team, and chart. ° °Past medical history:  °Anemia, Constipation, Asthma, Hep C, Fibromyalgia, GERD, HTN ° °Significant events:  °1/24 Admit  ° °Studies:  °CT head 1/24 >> atrophy, chronic microvascular disease °CT abd/pelvis 1/26 >> b/l ASD, severe fatty liver, small lesion Rt kidney (pyelonephritis or small renal infarct) ° °Micro:  °COVID/Flu 1/24 >> negative °Urine 1/24 >> multiple species °Blood 1/24 >> GNR >> °C diff 1/26 >> negative ° Lines:  ° °  °Antibiotics:  °Vancomycin 1/24 °Cefepime 1/24 >>  ° Consults:  ° °  ° °Interim history:  ° ° °Vital signs:  °BP 122/86    Pulse (!) 118    Temp 98.6 °F (37 °C) (Oral)    Resp (!) 22    Ht 5' 6" (1.676 m)    Wt 63.3 kg    SpO2 97%    BMI 22.52 kg/m²  ° Intake/output:  °I/O last 3 completed shifts: °In: 1200 [P.O.:1200] °Out: 1000 [Urine:1000] °  °Physical exam:  ° °General - somnolent °Eyes - pupils reactive °ENT - oxygen mask on °Cardiac - regular, tachycardic °Chest - b/l rales °Abdomen - soft, non tender, + bowel sounds °Extremities - no cyanosis, clubbing, or edema °Skin - sacral wound °Neuro - wakes up easily, follows simple commands, moves extremities ° Best practice:  ° °DVT - heparin °SUP - protonix °Nutrition - D3 diet  ° °Assessment/plan:  ° °Acute hypoxic respiratory failure 2nd to pneumonia (possible from aspiration). °Tobacco abuse with hx of asthma. °- continue ABx °- f/u CXR intermittently  °- goal SpO2 > 90% °-  monitor need for intubation in ICU °- scheduled duoneb ° °Sepsis with lactic acidosis from UTI with possible Rt pyelonephritis and PNA. °GNR in blood culture from 1/24. °- day 4 of cefepime °- f/u blood culture results ° °Acute metabolic encephalopathy from sepsis. °- monitor mental status ° °Hypokalemia. °- f/u BMET ° °Anemia of critical illness and chronic disease. °- f/u CBC °- transfuse for Hb < 7 ° °Pressure injuries. °- sacrum, stage 4 (POA) °- Lt medial heel, deep tissue (POA) °- wound care ° °Resolved hospital problems:  ° ° °Goals of care/Family discussions:  °Code status: full code ° °Updated her sister, Lily, at bedside.  Lily reports she is POA. ° °Labs:  ° °CMP Latest Ref Rng & Units 08/28/2021 08/27/2021 08/26/2021  °Glucose 70 - 99 mg/dL 119(H) 132(H) 130(H)  °BUN 8 - 23 mg/dL 16 17 13  °Creatinine 0.44 - 1.00 mg/dL 0.78 0.76 0.80  °Sodium 135 - 145 mmol/L 138 140 139  °Potassium 3.5 - 5.1 mmol/L 2.6(LL) 2.7(LL) 3.4(L)  °Chloride 98 - 111 mmol/L 110 111 112(H)  °CO2 22 - 32 mmol/L 20(L) 20(L) 20(L)  °Calcium 8.9 - 10.3 mg/dL 8.7(L) 9.5 8.7(L)  °Total Protein 6.5 - 8.1 g/dL - 5.3(L) 4.8(L)  °Total Bilirubin 0.3 - 1.2 mg/dL - 0.6 0.8  °Alkaline Phos 38 - 126 U/L - 277(H) 255(H)  °AST 15 -

## 2021-08-29 ENCOUNTER — Inpatient Hospital Stay (HOSPITAL_COMMUNITY): Payer: Medicare Other

## 2021-08-29 DIAGNOSIS — K7030 Alcoholic cirrhosis of liver without ascites: Secondary | ICD-10-CM | POA: Diagnosis not present

## 2021-08-29 DIAGNOSIS — J9621 Acute and chronic respiratory failure with hypoxia: Secondary | ICD-10-CM | POA: Diagnosis not present

## 2021-08-29 DIAGNOSIS — A419 Sepsis, unspecified organism: Secondary | ICD-10-CM | POA: Diagnosis not present

## 2021-08-29 DIAGNOSIS — G9341 Metabolic encephalopathy: Secondary | ICD-10-CM | POA: Diagnosis not present

## 2021-08-29 LAB — D-DIMER, QUANTITATIVE: D-Dimer, Quant: 2.07 ug/mL-FEU — ABNORMAL HIGH (ref 0.00–0.50)

## 2021-08-29 LAB — BASIC METABOLIC PANEL
Anion gap: 7 (ref 5–15)
BUN: 14 mg/dL (ref 8–23)
CO2: 19 mmol/L — ABNORMAL LOW (ref 22–32)
Calcium: 9.5 mg/dL (ref 8.9–10.3)
Chloride: 113 mmol/L — ABNORMAL HIGH (ref 98–111)
Creatinine, Ser: 0.89 mg/dL (ref 0.44–1.00)
GFR, Estimated: >60 mL/min (ref >60)
Glucose, Bld: 102 mg/dL — ABNORMAL HIGH (ref 70–99)
Potassium: 3.8 mmol/L (ref 3.5–5.1)
Sodium: 139 mmol/L (ref 135–145)

## 2021-08-29 LAB — CBC
HCT: 26.9 % — ABNORMAL LOW (ref 36.0–46.0)
Hemoglobin: 8.6 g/dL — ABNORMAL LOW (ref 12.0–15.0)
MCH: 31.3 pg (ref 26.0–34.0)
MCHC: 32 g/dL (ref 30.0–36.0)
MCV: 97.8 fL (ref 80.0–100.0)
Platelets: 339 10*3/uL (ref 150–400)
RBC: 2.75 MIL/uL — ABNORMAL LOW (ref 3.87–5.11)
RDW: 19.6 % — ABNORMAL HIGH (ref 11.5–15.5)
WBC: 9.2 10*3/uL (ref 4.0–10.5)
nRBC: 0.4 % — ABNORMAL HIGH (ref 0.0–0.2)

## 2021-08-29 LAB — MAGNESIUM: Magnesium: 2.1 mg/dL (ref 1.7–2.4)

## 2021-08-29 LAB — PROCALCITONIN: Procalcitonin: 2.76 ng/mL

## 2021-08-29 LAB — LACTIC ACID, PLASMA: Lactic Acid, Venous: 2.1 mmol/L (ref 0.5–1.9)

## 2021-08-29 LAB — PHOSPHORUS: Phosphorus: 1.3 mg/dL — ABNORMAL LOW (ref 2.5–4.6)

## 2021-08-29 LAB — HEPARIN LEVEL (UNFRACTIONATED): Heparin Unfractionated: 0.54 IU/mL (ref 0.30–0.70)

## 2021-08-29 MED ORDER — IOHEXOL 350 MG/ML SOLN
75.0000 mL | Freq: Once | INTRAVENOUS | Status: AC | PRN
  Administered 2021-08-29: 75 mL via INTRAVENOUS

## 2021-08-29 MED ORDER — SODIUM CHLORIDE 0.9 % IV SOLN
2.0000 g | Freq: Two times a day (BID) | INTRAVENOUS | Status: DC
  Administered 2021-08-29: 2 g via INTRAVENOUS
  Filled 2021-08-29: qty 2

## 2021-08-29 MED ORDER — K PHOS MONO-SOD PHOS DI & MONO 155-852-130 MG PO TABS
500.0000 mg | ORAL_TABLET | Freq: Two times a day (BID) | ORAL | Status: DC
  Administered 2021-08-29: 500 mg via ORAL
  Filled 2021-08-29: qty 2

## 2021-08-29 MED ORDER — HEPARIN (PORCINE) 25000 UT/250ML-% IV SOLN
1000.0000 [IU]/h | INTRAVENOUS | Status: DC
  Administered 2021-08-29: 1000 [IU]/h via INTRAVENOUS
  Filled 2021-08-29: qty 250

## 2021-08-29 MED ORDER — KETOROLAC TROMETHAMINE 15 MG/ML IJ SOLN
15.0000 mg | Freq: Once | INTRAMUSCULAR | Status: AC
  Administered 2021-08-29: 15 mg via INTRAVENOUS
  Filled 2021-08-29: qty 1

## 2021-08-29 MED ORDER — HEPARIN BOLUS VIA INFUSION
3000.0000 [IU] | Freq: Once | INTRAVENOUS | Status: AC
  Administered 2021-08-29: 3000 [IU] via INTRAVENOUS
  Filled 2021-08-29: qty 3000

## 2021-08-29 NOTE — Progress Notes (Signed)
Discussed by radiologist, Dr. Pascal Lux -CTA chest--positive for PE, no sign of RV strain -start IV heparin -updated patient's sister -order limited echo -08/17/21 Echo EF 65-70%, no WMA, normal RV  Shanon Brow Latoiya Maradiaga

## 2021-08-29 NOTE — Progress Notes (Signed)
ANTICOAGULATION CONSULT NOTE - Initial Consult  Pharmacy Consult for heparin gtt  Indication: DVT  Allergies  Allergen Reactions   Penicillins Rash    has tolerated ceftriaxone in the past    Patient Measurements: Height: 5\' 6"  (167.6 cm) Weight: 61.9 kg (136 lb 7.4 oz) IBW/kg (Calculated) : 59.3 Heparin Dosing Weight: HEPARIN DW (KG): 60.8   Vital Signs: Temp: 98.9 F (37.2 C) (01/28 0810) Temp Source: Axillary (01/28 0810) BP: 131/94 (01/28 0630) Pulse Rate: 113 (01/28 0810)  Labs: Recent Labs    08/27/21 0624 08/28/21 0603 08/29/21 0604  HGB 8.6* 8.4* 8.6*  HCT 26.8* 25.5* 26.9*  PLT 265 281 339  CREATININE 0.76 0.78 0.89    Estimated Creatinine Clearance: 58.2 mL/min (by C-G formula based on SCr of 0.89 mg/dL).   Medical History: Past Medical History:  Diagnosis Date   Anemia    Asthma    Chronic abdominal pain    Chronic back pain    Chronic constipation    Cirrhosis (HCC)    Metavir score F4 on elastography 2015   Cyclical vomiting    Fibroids    Fibromyalgia    GERD (gastroesophageal reflux disease)    H. pylori infection 2014   treated with pylera, had to be treated again as it was not eradicated. Urea breath test then negative after subsequent treatment.    [name]    HCV RNA positive 09/2012   Hypertension    Marijuana use    Nausea and vomiting    chronic, recurrent   PONV (postoperative nausea and vomiting)    pt thinks maybe once she had N&V   Sciatica of left side     Medications:  Medications Prior to Admission  Medication Sig Dispense Refill Last Dose   albuterol (VENTOLIN HFA) 108 (90 Base) MCG/ACT inhaler Inhale 2 puffs into the lungs every 6 (six) hours as needed for wheezing or shortness of breath. 18 g 2 unk   allopurinol (ZYLOPRIM) 300 MG tablet Take 300 mg by mouth daily.   Past Week   amLODipine (NORVASC) 5 MG tablet Take 1 tablet (5 mg total) by mouth daily. 30 tablet 1 Past Week   aspirin EC 81 MG EC tablet Take 1  tablet (81 mg total) by mouth daily. Swallow whole. 30 tablet 11 Past Week   Cholecalciferol (DIALYVITE VITAMIN D3 MAX) 1.25 MG (50000 UT) TABS Take 1 tablet by mouth every 7 (seven) days.   Past Week   cyclobenzaprine (FLEXERIL) 5 MG tablet Take 5 mg by mouth 3 (three) times daily.   Past Week   folic acid (FOLVITE) 1 MG tablet Take 1 tablet (1 mg total) by mouth daily. 30 tablet 1 Past Week   gabapentin (NEURONTIN) 100 MG capsule Take 1 capsule (100 mg total) by mouth 3 (three) times daily. (Patient taking differently: Take 100 mg by mouth daily.) 90 capsule 2 Past Week   HYDROcodone-acetaminophen (NORCO/VICODIN) 5-325 MG tablet Take 1 tablet by mouth every 6 (six) hours as needed. (Patient taking differently: Take 1 tablet by mouth every 6 (six) hours as needed for moderate pain.) 8 tablet 0 Past Week   hydrOXYzine (ATARAX) 10 MG tablet Take 1 tablet (10 mg total) by mouth daily as needed. (Patient taking differently: Take 10 mg by mouth daily as needed for anxiety.) 30 tablet 0 Past Week   megestrol (MEGACE) 40 MG tablet Take 1 tablet (40 mg total) by mouth daily. 480 tablet 1 Past Week   metoprolol tartrate (  LOPRESSOR) 50 MG tablet Take 1 tablet (50 mg total) by mouth 2 (two) times daily. 60 tablet 1 Past Week at unk   naloxone [facility]) nasal spray 4 mg/0.1 mL See admin instructions. ADMINISTER A SINGLE SPRAYEOF NARCAN INTO ONECNOSTRIL. REPEAT AFTER 3SMINUTES IN OTHER NOSTRIL IF NO RESPONSE.   unk   ondansetron (ZOFRAN) 4 MG tablet Take 1 tablet (4 mg total) by mouth every 6 (six) hours as needed for nausea. 20 tablet 0 unk   pantoprazole (PROTONIX) 40 MG tablet Take 1 tablet (40 mg total) by mouth 2 (two) times daily. 60 tablet 2 Past Week   Plecanatide (TRULANCE) 3 MG TABS Take 3 mg by mouth daily. 30 tablet 11 Past Week   potassium chloride (MICRO-K) 10 MEQ CR capsule Take 20 mEq by mouth daily.   Past Week   sodium bicarbonate 650 MG tablet Take 1 tablet (650 mg total) by mouth 3 (three) times  daily. 12 tablet 0 Past Week   lactulose (CHRONULAC) 10 GM/15ML solution Take 30 mLs (20 g total) by mouth 2 (two) times daily as needed for mild constipation or moderate constipation. (Patient not taking: Reported on 08/26/2021) 473 mL 1 Not Taking   Scheduled:   aspirin EC  81 mg Oral Daily   Chlorhexidine Gluconate Cloth  6 each Topical Daily   diltiazem  30 mg Oral Q6H   feeding supplement  237 mL Oral BID BM   folic acid  1 mg Oral Daily   guaiFENesin  600 mg Oral BID   heparin  3,000 Units Intravenous Once   ipratropium-albuterol  3 mL Nebulization TID   mouth rinse  15 mL Mouth Rinse BID   metoprolol tartrate  7.5 mg Intravenous Q6H   pantoprazole  40 mg Oral BID   phosphorus  500 mg Oral BID   potassium chloride  40 mEq Oral Daily   sodium bicarbonate  650 mg Oral TID   thiamine injection  100 mg Intravenous Daily   Infusions:   ceFEPime (MAXIPIME) IV 2 g (08/29/21 0639)   heparin     PRN: acetaminophen **OR** acetaminophen, albuterol, HYDROcodone-acetaminophen, hydrOXYzine, loperamide, ondansetron **OR** ondansetron (ZOFRAN) IV Anti-infectives (From admission, onward)    Start     Dose/Rate Route Frequency Ordered Stop   08/26/21 2000  vancomycin (VANCOCIN) IVPB 1000 mg/200 mL premix  Status:  Discontinued       See Hyperspace for full Linked Orders Report.   1,000 mg 200 mL/hr over 60 Minutes Intravenous Every 24 hours 09-08-2021 1906 08/26/21 1016   08/26/21 1400  ceFEPIme (MAXIPIME) 2 g in sodium chloride 0.9 % 100 mL IVPB        2 g 200 mL/hr over 30 Minutes Intravenous Every 8 hours 08/26/21 0816     09/08/2021 2000  cefTRIAXone (ROCEPHIN) 1 g in sodium chloride 0.9 % 100 mL IVPB  Status:  Discontinued        1 g 200 mL/hr over 30 Minutes Intravenous Every 24 hours 09-08-2021 1853 09-08-21 1855   09-08-2021 2000  vancomycin (VANCOREADY) IVPB 1250 mg/250 mL       See Hyperspace for full Linked Orders Report.   1,250 mg 166.7 mL/hr over 90 Minutes Intravenous  Once  2021-09-08 1906 09/08/21 2234   09/08/2021 1930  ceFEPIme (MAXIPIME) 2 g in sodium chloride 0.9 % 100 mL IVPB  Status:  Discontinued        2 g 200 mL/hr over 30 Minutes Intravenous Every 12 hours 2021/09/08 1900 08/26/21  4166   2021/09/23 1845  aztreonam (AZACTAM) 2 g in sodium chloride 0.9 % 100 mL IVPB  Status:  Discontinued        2 g 200 mL/hr over 30 Minutes Intravenous  Once 23-Sep-2021 1835 23-Sep-2021 1850       Assessment: Colleen Lee a 66 y.o. female requires anticoagulation with a heparin iv infusion for the indication of  DVT. Heparin gtt will be started following pharmacy protocol per pharmacy consult. Patient is not on previous oral anticoagulant that will require aPTT/HL correlation before transitioning to only HL monitoring.   Goal of Therapy:  Heparin level 0.3-0.7 units/ml Monitor platelets by anticoagulation protocol: Yes   Plan:  Give 3000 units bolus x 1 Start heparin infusion at 1000 units/hr Check anti-Xa level in 6 hours and daily while on heparin Continue to monitor H&H and platelets   Gerre Pebbles Jenelle Drennon 08/29/2021,8:22 AM

## 2021-08-29 NOTE — Progress Notes (Signed)
Pharmacy Antibiotic Note  Colleen Lee is a 66 y.o. female admitted on 08/31/2021 with AMS and fever 100.7.  Pt recently discharged from [facility] to SNF for rehab. Pharmacy has been consulted for Cefepime dosing for UTI vs PNA. Renal fxn improved, MRSA PCR is negative  Plan: Change Cefepime 2gm IV q12h F/u cxs and clinical progress Monitor V/S , labs  Height: 5\' 6"  (167.6 cm) Weight: 61.9 kg (136 lb 7.4 oz) IBW/kg (Calculated) : 59.3  Temp (24hrs), Avg:99.2 F (37.3 C), Min:98.3 F (36.8 C), Max:100.5 F (38.1 C)  Recent Labs  Lab 08/21/2021 1752 08/22/2021 2040 08/03/2021 2240 08/26/21 0040 08/26/21 0406 08/27/21 0624 08/28/21 0603 08/29/21 0604  WBC 13.6*  --   --   --  10.8* 8.4 9.5 9.2  CREATININE 1.02*  --   --   --  0.80 0.76 0.78 0.89  LATICACIDVEN 4.6*   < > 4.7* 4.5* 3.3* 3.0*  --  2.1*   < > = values in this interval not displayed.     Estimated Creatinine Clearance: 58.2 mL/min (by C-G formula based on SCr of 0.89 mg/dL).    Allergies  Allergen Reactions   Penicillins Rash    has tolerated ceftriaxone in the past    Antimicrobials this admission: Vanc 1/24 >> 1/25 Cefepime  1/24 >>   Microbiology results: 1/24 BCx: GNR no BCID  1/24 UCx: mult species suggesting recollection  Thank you for allowing pharmacy to be a part of this patients care.  Thomasenia Sales, PharmD, [facility] Clinical Pharmacist   08/29/2021 9:59 AM

## 2021-08-29 NOTE — Progress Notes (Signed)
PROGRESS NOTE  Colleen Lee:[id number] DOB: 04-May-1956 DOA: 08/15/2021 PCP: Sandi Mariscal, MD Brief History:  66 year old female with a history of alcoholic liver cirrhosis, fibromyalgia, hypertension, [name], anemia of chronic disease, vitamin deficiency presenting with altered mental status.  The patient was unable to provide any history, but there was some type of handwritten note accompanying the patient stating that the patient has been shivering with a fever up to 100.7 F and confusion. Notably, the patient had a prolonged hospitalization from 08/13/2021 to 08/23/2021.  During that hospitalization, the patient was treated for acute metabolic encephalopathy which is likely multifactorial including failure to thrive and numerous electrolyte and vitamin and mineral deficiencies.  She was also noted to have an incidental subacute stroke on MRI of the brain.  She was seen by neurology and recommended continuing aspirin 81 mg daily.  EEG was negative.  During the hospitalization, the patient received 1 unit PRBC without any evidence of ongoing blood loss.  PT recommended skilled nursing facility, but the patient was ultimately discharged home. In the ED, the patient was afebrile and tachycardic up to 130s.  He was hemodynamically stable.  Oxygen saturation was 97-100% on 3 L.  BMP shows sodium 139, potassium 3.4, bicarbonate 20, BUN 13, creatinine 0.0.  Ammonia 44, AST 34, ALT 19, alk phosphatase 255, total bilirubin 0.8.  Chest x-ray showed patchy right lower lobe airspace disease with diffuse interstitial GGO in the left thorax.  Patient was started on vancomycin and cefepime and lactated Ringer's.  Vancomycin was discontinued and cefepime was continued after MRSA screen was neg.  She developed fluid overload from fluid resuscitation and was given lasix IV x 2.  Pulmonary was consulted to assist and agreed with current management plan.   Assessment/Plan: Severe sepsis -Secondary to  bacteremia, pneumonia and UTI/pyelo -Presented with elevated lactate, tachycardia, leukocytosis -Continue vancomycin and cefepime pending culture data -Follow blood culture--neg to date -Follow urine culture--multiple organisms -Lactic acid peaked 4.6>>3.0>>2.1 -Check PCT-5.33>>3.66>>2.76   Lobar pneumonia -Suspect a degree of aspiration -MRSA screen--neg -Continue empiric cefepime for now   Gram Negative Bacteremia -08/24/2021 blood culture--GNR -continue empiric cefepime pending culture data   Acute respiratory failure with hypoxia -Secondary to pneumonia and pleural effusions -COVID-19 PCR negative -Stable on 6 L>>3L -Wean oxygen as tolerated -repeat CXR--personally reviewed--increased interstitial markings, RUL opacity -lasix IV 40 mg on 1/26 and 1/27 -1/28--personally reviewed CXR--RUL opacity, stable increased interstitial markings -D-dimer 2.07>>>order CTA chest   SVT/Atrial tachycardia -HR transiently up to 140s -Continue lopressor 5 mg IV q 6 -add diltiazem 30 mg po q 6 hours -1/25 Echo--EF 65-70%, no WMA, G1DD, RVSP 39.8 -TSH 1.183   Pyuria -UA 11-20 WBC -Follow urine culture>>multiple organisms -Continue empiric antibiotics   Acute metabolic encephalopathy -Multifactorial including infectious process, vitamin and mineral deficiencies, dehydration -UA 11-20 WBC -TSH 1.183 -B12--1698 -08/18/2021 RPR negative -TSH--1.183 -only A&O x 1 but more alert and cooperative   Alcoholic liver cirrhosis -INR 1.5   Hyperammonemia -Unclear clinical significance presently -Recheck ammonia--23 -Discontinue lactulose   Anemia of chronic disease -2/58/5277 folic acid 5.5 -03/25/2352 B12 1658 -08/17/2028 thiamine 183.3 -Transfuse for hemoglobin less than 7 -Continue folic acid and thiamine supplementation   Hypomagnesemia/hypophosphatemia -Repleted   Hypokalemia -Replete   Stage 4 sacral decubitus -present on admission -appreciate WOCN recommendation -CT  abd/pelvis--New small peripheral areas of decreased enhancement in right kidney,which may be due to small renal infarcts or pyelonephritis. Bibasilar pulmonary airspace  opacity, with small bilateral pleural effusions. +anasarca   Severe Malnutrition -nutritional supplements     Goals of Care -discussed with sister 1/27 and 1/28 -remains full scope of care          Total time spent 50 minutes.  Greater than 50% spent face to face counseling and coordinating care.      Family Communication:   sister and brother updated 1/28   Consultants:  pulmonary   Code Status:  FULL   DVT Prophylaxis:  Pierce Heparin     Procedures: As Listed in Progress Note Above   Antibiotics: Vanc 1/24>>1/25 Cefepime 1/25>>  Subjective: Patient is awake but remains confused.  She answers yes to all questions.  Remainder of review of systems unreliable secondary to encephalopathy.  Objective: Vitals:   08/29/21 0023 08/29/21 0522 08/29/21 0600 08/29/21 0630  BP:   (!) 131/94 (!) 131/94  Pulse:   (!) 125 (!) 130  Resp:   (!) 38 (!) 38  Temp: 98.3 F (36.8 C) 100 F (37.8 C)    TempSrc: Oral Oral    SpO2:   95% 96%  Weight:    61.9 kg  Height:        Intake/Output Summary (Last 24 hours) at 08/29/2021 0724 Last data filed at 08/29/2021 0523 Gross per 24 hour  Intake 706.47 ml  Output 1675 ml  Net -968.53 ml   Weight change: -1.4 kg Exam:  General:  Pt is alert, intermittently follows commands, not in acute distress HEENT: No icterus, No thrush, No neck mass, [place]/AT Cardiovascular: RRR, S1/S2, no rubs, no gallops Respiratory: Bibasilar crackles,) left. Abdomen: Soft/+BS, non tender, non distended, no guarding Extremities: trace LE edema, No lymphangitis, No petechiae, No rashes, no synovitis   Data Reviewed: I have personally reviewed following labs and imaging studies Basic Metabolic Panel: Recent Labs  Lab 08/23/21 0543 08/28/2021 1752 08/26/21 0406 08/27/21 0624 08/28/21 0603  08/28/21 2134  NA 138 142 139 140 138  --   K 3.9 4.1 3.4* 2.7* 2.6* 4.3  CL 111 108 112* 111 110  --   CO2 18* 21* 20* 20* 20*  --   GLUCOSE 92 111* 130* 132* 119*  --   BUN $Re'10 17 13 17 16  'DNo$ --   CREATININE 0.85 1.02* 0.80 0.76 0.78  --   CALCIUM 8.8* 9.8 8.7* 9.5 8.7*  --   MG 1.9  --  1.5* 2.1 1.7  --   PHOS 2.4*  --   --   --   --   --    Liver Function Tests: Recent Labs  Lab 08/15/2021 1752 08/26/21 0406 08/27/21 0624  AST 54* 34 40  ALT $Re'25 19 19  'vcr$ ALKPHOS 424* 255* 277*  BILITOT 1.2 0.8 0.6  PROT 6.4* 4.8* 5.3*  ALBUMIN 1.7* 1.6* 1.8*   No results for input(s): LIPASE, AMYLASE in the last 168 hours. Recent Labs  Lab 08/06/2021 1752 08/27/21 0624  AMMONIA 44* 23   Coagulation Profile: Recent Labs  Lab 08/26/21 0406  INR 1.5*   CBC: Recent Labs  Lab 08/08/2021 1752 08/26/21 0406 08/27/21 0624 08/28/21 0603 08/29/21 0604  WBC 13.6* 10.8* 8.4 9.5 9.2  NEUTROABS 12.2* 10.4*  --   --   --   HGB 10.4* 7.5* 8.6* 8.4* 8.6*  HCT 31.0* 23.3* 26.8* 25.5* 26.9*  MCV 97.8 98.3 97.8 98.8 97.8  PLT 287 214 265 281 339   Cardiac Enzymes: No results for input(s): CKTOTAL, CKMB, CKMBINDEX, TROPONINI  in the last 168 hours. BNP: Invalid input(s): POCBNP CBG: No results for input(s): GLUCAP in the last 168 hours. HbA1C: No results for input(s): HGBA1C in the last 72 hours. Urine analysis:    Component Value Date/Time   COLORURINE AMBER (A) 08/31/2021 1721   APPEARANCEUR HAZY (A) 08/31/2021 1721   LABSPEC 1.016 08/15/2021 1721   PHURINE 5.0 08/31/2021 1721   GLUCOSEU NEGATIVE 08/04/2021 1721   HGBUR NEGATIVE 08/05/2021 1721   BILIRUBINUR NEGATIVE 08/28/2021 1721   KETONESUR NEGATIVE 08/22/2021 1721   PROTEINUR 30 (A) 08/12/2021 1721   UROBILINOGEN 0.2 04/01/2015 1140   NITRITE POSITIVE (A) 08/20/2021 1721   LEUKOCYTESUR MODERATE (A) 08/14/2021 1721   Sepsis Labs: @LABRCNTIP (procalcitonin:4,lacticidven:4) ) Recent Results (from the past 240 hour(s))  Urine  Culture     Status: Abnormal   Collection Time: 08/03/2021  3:30 AM   Specimen: Urine, Clean Catch  Result Value Ref Range Status   Specimen Description   Final    URINE, CLEAN CATCH Performed at [facility], [address], [place], [place] [address]    Special Requests   Final    NONE Performed at [facility], [address], [place], [place] [address]    Culture (A)  Final    >=100,000 COLONIES/mL MULTIPLE SPECIES PRESENT, SUGGEST RECOLLECTION   Report Status 08/27/2021 FINAL  Final  Culture, blood (single)     Status: None (Preliminary result)   Collection Time: 08/26/2021  5:52 PM   Specimen: BLOOD LEFT ARM  Result Value Ref Range Status   Specimen Description   Final    BLOOD LEFT ARM Performed at [facility], [address] [place], [place] [address]    Special Requests   Final    BOTTLES DRAWN AEROBIC AND ANAEROBIC Blood Culture adequate volume Performed at [facility], [address], [place], [place] [address]    Culture  Setup Time   Final    GRAM NEGATIVE RODS anaerobic bottle Gram Stain Report Called to,Read Back By and Verified With: [email or web address]  by Zigmund Daniel, b 1.27.23    Culture PENDING  Incomplete   Report Status PENDING  Incomplete  Blood Culture ID Panel (Reflexed)     Status: None   Collection Time: 08/15/2021  5:52 PM  Result Value Ref Range Status   Enterococcus faecalis NOT DETECTED NOT DETECTED Final   Enterococcus Faecium NOT DETECTED NOT DETECTED Final   Listeria monocytogenes NOT DETECTED NOT DETECTED Final   Staphylococcus species NOT DETECTED NOT DETECTED Final   Staphylococcus aureus (BCID) NOT DETECTED NOT DETECTED Final   Staphylococcus epidermidis NOT DETECTED NOT DETECTED Final   Staphylococcus lugdunensis NOT DETECTED NOT DETECTED Final   Streptococcus species NOT DETECTED NOT DETECTED Final   Streptococcus agalactiae NOT DETECTED NOT DETECTED Final   Streptococcus pneumoniae NOT DETECTED NOT DETECTED Final   Streptococcus pyogenes NOT  DETECTED NOT DETECTED Final   A.calcoaceticus-baumannii NOT DETECTED NOT DETECTED Final   Bacteroides fragilis NOT DETECTED NOT DETECTED Final   Enterobacterales NOT DETECTED NOT DETECTED Final   Enterobacter cloacae complex NOT DETECTED NOT DETECTED Final   Escherichia coli NOT DETECTED NOT DETECTED Final   Klebsiella aerogenes NOT DETECTED NOT DETECTED Final   Klebsiella oxytoca NOT DETECTED NOT DETECTED Final   Klebsiella pneumoniae NOT DETECTED NOT DETECTED Final   Proteus species NOT DETECTED NOT DETECTED Final   Salmonella species NOT DETECTED NOT DETECTED Final   Serratia marcescens NOT DETECTED NOT DETECTED Final   Haemophilus influenzae NOT DETECTED NOT DETECTED Final  Neisseria meningitidis NOT DETECTED NOT DETECTED Final   Pseudomonas aeruginosa NOT DETECTED NOT DETECTED Final   Stenotrophomonas maltophilia NOT DETECTED NOT DETECTED Final   Candida albicans NOT DETECTED NOT DETECTED Final   Candida auris NOT DETECTED NOT DETECTED Final   Candida glabrata NOT DETECTED NOT DETECTED Final   Candida krusei NOT DETECTED NOT DETECTED Final   Candida parapsilosis NOT DETECTED NOT DETECTED Final   Candida tropicalis NOT DETECTED NOT DETECTED Final   Cryptococcus neoformans/gattii NOT DETECTED NOT DETECTED Final    Comment: Performed at [facility], [place] [address], [place], [place] [address]  Resp Panel by RT-PCR (Flu A&B, Covid) Nasopharyngeal Swab     Status: None   Collection Time: 08/31/2021  7:14 PM   Specimen: Nasopharyngeal Swab; Nasopharyngeal(NP) swabs in vial transport medium  Result Value Ref Range Status   SARS Coronavirus 2 by RT PCR NEGATIVE NEGATIVE Final    Comment: (NOTE) SARS-CoV-2 target nucleic acids are NOT DETECTED.  The SARS-CoV-2 RNA is generally detectable in upper respiratory specimens during the acute phase of infection. The lowest concentration of SARS-CoV-2 viral copies this assay can detect is 138 copies/mL. A negative result does not preclude  SARS-Cov-2 infection and should not be used as the sole basis for treatment or other patient management decisions. A negative result may occur with  improper specimen collection/handling, submission of specimen other than nasopharyngeal swab, presence of viral mutation(s) within the areas targeted by this assay, and inadequate number of viral copies(<138 copies/mL). A negative result must be combined with clinical observations, patient history, and epidemiological information. The expected result is Negative.  Fact Sheet for Patients:  EntrepreneurPulse.com.au  Fact Sheet for Healthcare Providers:  IncredibleEmployment.be  This test is no t yet approved or cleared by the [place] FDA and  has been authorized for detection and/or diagnosis of SARS-CoV-2 by FDA under an Emergency Use Authorization (EUA). This EUA will remain  in effect (meaning this test can be used) for the duration of the COVID-19 declaration under Section 564(b)(1) of the Act, 21 U.S.C.section 360bbb-3(b)(1), unless the authorization is terminated  or revoked sooner.       Influenza A by PCR NEGATIVE NEGATIVE Final   Influenza B by PCR NEGATIVE NEGATIVE Final    Comment: (NOTE) The Xpert Xpress SARS-CoV-2/FLU/RSV plus assay is intended as an aid in the diagnosis of influenza from Nasopharyngeal swab specimens and should not be used as a sole basis for treatment. Nasal washings and aspirates are unacceptable for Xpert Xpress SARS-CoV-2/FLU/RSV testing.  Fact Sheet for Patients: EntrepreneurPulse.com.au  Fact Sheet for Healthcare Providers: IncredibleEmployment.be  This test is not yet approved or cleared by the [place] FDA and has been authorized for detection and/or diagnosis of SARS-CoV-2 by FDA under an Emergency Use Authorization (EUA). This EUA will remain in effect (meaning this test can be used) for the duration of  the COVID-19 declaration under Section 564(b)(1) of the Act, 21 U.S.C. section 360bbb-3(b)(1), unless the authorization is terminated or revoked.  Performed at [facility], [address], [place],  [address]   MRSA Next Gen by PCR, Nasal     Status: None   Collection Time: 08/26/21 12:00 AM   Specimen: Nasal Mucosa; Nasal Swab  Result Value Ref Range Status   MRSA by PCR Next Gen NOT DETECTED NOT DETECTED Final    Comment: (NOTE) The GeneXpert MRSA Assay (FDA approved for NASAL specimens only), is one component of a comprehensive MRSA colonization surveillance program.  It is not intended to diagnose MRSA infection nor to guide or monitor treatment for MRSA infections. Test performance is not FDA approved in patients less than 32 years old. Performed at [facility], [address], [place], [place] [address]   C Difficile Quick Screen w PCR reflex     Status: None   Collection Time: 08/27/21 12:08 PM   Specimen: STOOL  Result Value Ref Range Status   C Diff antigen NEGATIVE NEGATIVE Final   C Diff toxin NEGATIVE NEGATIVE Final   C Diff interpretation No C. difficile detected.  Final    Comment: Performed at [facility], [address], [place],  [address]     Scheduled Meds:  aspirin EC  81 mg Oral Daily   Chlorhexidine Gluconate Cloth  6 each Topical Daily   diltiazem  30 mg Oral Q6H   feeding supplement  237 mL Oral BID BM   folic acid  1 mg Oral Daily   guaiFENesin  600 mg Oral BID   heparin  5,000 Units Subcutaneous Q8H   ipratropium-albuterol  3 mL Nebulization TID   mouth rinse  15 mL Mouth Rinse BID   metoprolol tartrate  7.5 mg Intravenous Q6H   pantoprazole  40 mg Oral BID   potassium chloride  40 mEq Oral Daily   sodium bicarbonate  650 mg Oral TID   thiamine injection  100 mg Intravenous Daily   Continuous Infusions:  ceFEPime (MAXIPIME) IV 2 g (08/29/21 0639)    Procedures/Studies: CT ANGIO HEAD NECK W WO CM  Result Date:  08/17/2021 CLINICAL DATA:  Stroke/TIA, determine embolic source. Right frontoparietal and cerebellar infarcts as well as abnormal appearance of the right vertebral artery on MRI. EXAM: CT ANGIOGRAPHY HEAD AND NECK TECHNIQUE: Multidetector CT imaging of the head and neck was performed using the standard protocol during bolus administration of intravenous contrast. Multiplanar CT image reconstructions and MIPs were obtained to evaluate the vascular anatomy. Carotid stenosis measurements (when applicable) are obtained utilizing NASCET criteria, using the distal internal carotid diameter as the denominator. RADIATION DOSE REDUCTION: This exam was performed according to the departmental dose-optimization program which includes automated exposure control, adjustment of the mA and/or kV according to patient size and/or use of iterative reconstruction technique. CONTRAST:  176mL OMNIPAQUE IOHEXOL 350 MG/ML SOLN COMPARISON:  Head MRI 08/17/2021 FINDINGS: CT HEAD FINDINGS Brain: There is a small acute infarct superiorly in the right cerebellar hemisphere as shown on MRI. The subcentimeter right frontoparietal subcortical infarct on MRI is not well seen by CT. No intracranial hemorrhage, mass, midline shift, or extra-axial fluid collection is identified. There is mild cerebral atrophy. Periventricular white matter hypodensities are nonspecific but compatible with mild chronic small vessel ischemic disease. Vascular: Calcified atherosclerosis at the skull base. Skull: No fracture or suspicious osseous lesion. Sinuses: Unchanged fluid in the right sphenoid sinus. Trace right mastoid fluid. Orbits: Bilateral cataract extraction. Review of the MIP images confirms the above findings CTA NECK FINDINGS Aortic arch: Normal variant aortic arch branching pattern with common origin of the brachiocephalic and left common carotid arteries. Widely patent arch vessel origins. Right carotid system: Patent with a small amount calcified plaque  at the carotid bifurcation. No evidence of a significant stenosis or dissection. Left carotid system: Patent with minimal atherosclerotic plaque at the carotid bifurcation and possible tiny web laterally in the proximal aspect of the carotid bulb. No evidence of a significant stenosis or dissection. Vertebral arteries: The vertebral arteries are patent without evidence  of a significant stenosis or dissection of the left vertebral artery which is strongly dominant. The right vertebral artery is diffusely small in caliber with a superimposed severe stenosis at its origin. Skeleton: Mild disc and moderate facet degeneration in the cervical spine. Other neck: No evidence of cervical lymphadenopathy or mass. Upper chest: Centrilobular emphysema. Partially visualized right PICC. Review of the MIP images confirms the above findings CTA HEAD FINDINGS Anterior circulation: Internal carotid arteries are patent from skull base to carotid termini. The cavernous carotid segments are tortuous with atherosclerotic calcification greater on the left not resulting in significant stenosis. ACAs and MCAs are patent without evidence of a proximal branch occlusion or significant proximal stenosis. No aneurysm is identified. Posterior circulation: The intracranial vertebral arteries are patent to the basilar with mild nonstenotic calcified plaque on the left. There is moderate to severe stenosis of the right vertebral artery proximal to the PICA origin. Patent PICA and SCA origins are seen bilaterally. The basilar artery is widely patent and tortuous. Posterior communicating arteries are diminutive or absent. Both PCAs are patent without evidence of a significant proximal stenosis. No aneurysm is identified. Venous sinuses: As permitted by contrast timing, patent. Anatomic variants: None. Review of the MIP images confirms the above findings IMPRESSION: 1. No large vessel occlusion. 2. 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 3. No significant stenosis of the strongly dominant left vertebral artery. 4. Minimal cervical carotid artery atherosclerosis without significant stenosis. 5. Emphysema (ICD10-J43.9). Electronically Signed   By: Logan Bores M.D.   On: 08/17/2021 14:53   CT HEAD WO CONTRAST  Result Date: 08/05/2021 CLINICAL DATA:  Mental status change, unknown cause EXAM: CT HEAD WITHOUT CONTRAST TECHNIQUE: Contiguous axial images were obtained from the base of the skull through the vertex without intravenous contrast. RADIATION DOSE REDUCTION: This exam was performed according to the departmental dose-optimization program which includes automated exposure control, adjustment of the mA and/or kV according to patient size and/or use of iterative reconstruction technique. COMPARISON:  08/17/2021 FINDINGS: Brain: There is atrophy and chronic small vessel disease changes. No acute intracranial abnormality. Specifically, no hemorrhage, hydrocephalus, mass lesion, acute infarction, or significant intracranial injury. Vascular: No hyperdense vessel or unexpected calcification. Skull: No acute calvarial abnormality. Sinuses/Orbits: No acute findings Other: None IMPRESSION: Atrophy, chronic microvascular disease. No acute intracranial abnormality. Electronically Signed   By: Rolm Baptise M.D.   On: 08/28/2021 18:45   CT Head Wo Contrast  Result Date: 08/04/2021 CLINICAL DATA:  Provided history: Neuro deficit, acute, stroke suspected. Mental status change, unknown cause. Slurred speech. EXAM: CT HEAD WITHOUT CONTRAST TECHNIQUE: Contiguous axial images were obtained from the base of the skull through the vertex without intravenous contrast. COMPARISON:  Brain MRI 07/13/2021.  Head CT 05/13/2014. FINDINGS: Brain: Mild generalized cerebral atrophy. Mild patchy and ill-defined hypoattenuation within the cerebral white matter, nonspecific but compatible with chronic small vessel ischemic disease. There  is no acute intracranial hemorrhage. No demarcated cortical infarct. No extra-axial fluid collection. No evidence of an intracranial mass. No midline shift. Vascular: No hyperdense vessel.  Atherosclerotic calcifications. Skull: Normal. Negative for fracture or focal lesion. Sinuses/Orbits: Visualized orbits show no acute finding. No significant paranasal sinus disease at the imaged levels. IMPRESSION: No evidence of acute intracranial abnormality. Mild chronic small vessel ischemic changes within the cerebral white matter. Mild generalized cerebral atrophy. Electronically Signed   By: Kellie Simmering D.O.   On: 08/04/2021 14:14   MR BRAIN  WO CONTRAST  Result Date: 08/17/2021 CLINICAL DATA:  Provided history: Delirium. EXAM: MRI HEAD WITHOUT CONTRAST TECHNIQUE: Multiplanar, multiecho pulse sequences of the brain and surrounding structures were obtained without intravenous contrast. COMPARISON:  Prior head CT examinations 08/13/2021 and earlier. Brain MRI 07/13/2021. FINDINGS: Brain: Intermittently motion degraded examination, limiting evaluation. Most notably, there is severe motion degradation of the sagittal T1 weighted sequence, severe motion degradation of the axial T1 weighted sequence and severe motion degradation of the coronal T2 TSE sequence. Mild to moderate generalized cerebral atrophy. Comparatively mild cerebellar atrophy. 5 mm acute/early subacute infarct within the left frontoparietal subcortical white matter (series 5, image 25) (series 7, image 12). 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 Chronic small vessel ischemic changes which are mild in cerebral white matter, and mild to moderate in the pons. No evidence of an intracranial mass. No chronic intracranial blood products. No extra-axial fluid collection. No midline shift. Vascular: Similar to the prior brain MRI of 07/13/2021, there is signal abnormality  within the intracranial right vertebral artery suggesting high-grade stenosis or vessel occlusion. Skull and upper cervical spine: Within described limitations, no focal suspicious marrow lesion is identified. Sinuses/Orbits: Visualized orbits show no acute finding. Large fluid level, and background mild mucosal thickening, within the right sphenoid sinus. Other: Trace fluid within the bilateral mastoid air cells. IMPRESSION: Intermittently motion degraded examination, as described and limiting evaluation. 5 mm acute/early subacute infarct within the right frontoparietal subcortical white matter. 1.8 x 0.7 cm acute infarct within the superior right cerebellar hemisphere. Background chronic small vessel ischemic changes which are mild in the cerebral white matter, and mild-to-moderate in the pons, stable from the brain MRI of 07/13/2021. Similar to the prior MRI, there is signal abnormality within the intracranial right vertebral artery suggesting high-grade stenosis or vessel occlusion. MR or CT angiography may be obtained for further evaluation, as clinically warranted. Mild-to-moderate generalized cerebral atrophy. Comparatively mild cerebellar atrophy. Right sphenoid sinusitis. Electronically Signed   By: Kellie Simmering D.O.   On: 08/17/2021 09:35   CT ABDOMEN PELVIS W CONTRAST  Result Date: 08/27/2021 CLINICAL DATA:  Acute abdominal pain.  Sepsis.  Cirrhosis. EXAM: CT ABDOMEN AND PELVIS WITH CONTRAST TECHNIQUE: Multidetector CT imaging of the abdomen and pelvis was performed using the standard protocol following bolus administration of intravenous contrast. RADIATION DOSE REDUCTION: This exam was performed according to the departmental dose-optimization program which includes automated exposure control, adjustment of the mA and/or kV according to patient size and/or use of iterative reconstruction technique. CONTRAST:  117mL OMNIPAQUE IOHEXOL 300 MG/ML  SOLN COMPARISON:  07/13/2021 FINDINGS: Lower Chest:  Bibasilar pulmonary airspace opacity is seen, which may be due to pulmonary edema, ARDS, or pneumonia. Small bilateral pleural effusions are also seen. Hepatobiliary: Severe diffuse hepatic steatosis is again seen, although there are no gross morphologic changes of cirrhosis. Gallbladder is unremarkable. No evidence of biliary ductal dilatation. No hepatic masses identified. Pancreas:  No mass or inflammatory changes. Spleen: Within normal limits in size and appearance. Adrenals/Urinary Tract: No masses identified. Small peripheral areas of decreased enhancement are seen in the mid and lower poles of the right kidney which were not seen on previous study and may be due to small renal infarcts or pyelonephritis. No evidence of ureteral calculi or hydronephrosis. Unremarkable unopacified urinary bladder. Stomach/Bowel: No evidence of obstruction, inflammatory process or abnormal fluid collections. Normal appendix visualized. Vascular/Lymphatic: No pathologically enlarged lymph nodes. No  acute vascular findings. Aortic atherosclerotic calcification noted. Reproductive: Several small calcified uterine fibroids are seen, largest measuring 2.0 cm. No adnexal masses are identified. Small amount of free fluid is seen in the pelvis. Other:  Mild diffuse body wall edema noted. Musculoskeletal:  No suspicious bone lesions identified. IMPRESSION: New small peripheral areas of decreased enhancement in right kidney, which may be due to small renal infarcts or pyelonephritis. Recommend correlation with urinalysis. Severe hepatic steatosis. Small calcified uterine fibroids. Small amount of pelvic ascites and diffuse body wall edema. Bibasilar pulmonary airspace opacity, with small bilateral pleural effusions. Differential diagnosis includes pulmonary edema, ARDS, and pneumonia. Aortic Atherosclerosis (ICD10-I70.0). Electronically Signed   By: Marlaine Hind M.D.   On: 08/27/2021 14:46   DG CHEST PORT 1 VIEW  Result Date:  08/27/2021 CLINICAL DATA:  Altered mental status, shortness of breath EXAM: PORTABLE CHEST 1 VIEW COMPARISON:  Previous studies including the examination of 08/12/2021 FINDINGS: Diffuse increased interstitial markings are seen in the left lung with possible slight improvement. There is new interstitial and alveolar infiltrate in the right upper lung fields. Costophrenic angles are clear. There is no pneumothorax. IMPRESSION: There is new large area of interstitial and alveolar infiltrate in the right upper lung fields suggesting pneumonia. There is slight improvement in aeration of left lung, possibly suggesting resolving pneumonia. There is no pleural effusion or pneumothorax. Electronically Signed   By: Elmer Picker M.D.   On: 08/27/2021 13:07   DG Chest Port 1 View  Result Date: 08/03/2021 CLINICAL DATA:  Altered mental status EXAM: PORTABLE CHEST 1 VIEW COMPARISON:  08/16/2021, CT 09/27/2020 FINDINGS: Interval development of diffuse ground-glass and interstitial opacity throughout left thorax with patchy airspace disease in the right lower lung. No pleural effusion. Normal cardiomediastinal silhouette. No pneumothorax. IMPRESSION: Interval development of patchy right lower lung airspace disease and diffuse interstitial and ground-glass opacity throughout left thorax, suspect that findings are second bilateral infection/pneumonia. Electronically Signed   By: Donavan Foil M.D.   On: 08/15/2021 17:25   DG CHEST PORT 1 VIEW  Result Date: 08/16/2021 CLINICAL DATA:  The reason for exam is stated as dyspnea. Hx of asthma, HTN, and current smoker of 1 pack a day x 40 years. Negative covid-19 test x 3 days ago. EXAM: PORTABLE CHEST 1 VIEW COMPARISON:  Chest x-ray 07/09/2021 acidosis chest 09/27/2000 FINDINGS: Right PICC line with tip overlying the expected region of the superior cavoatrial junction. The heart and mediastinal contours are unchanged. No focal consolidation. No pulmonary edema. No pleural  effusion. No pneumothorax. No acute osseous abnormality. IMPRESSION: No active disease. Electronically Signed   By: Iven Finn M.D.   On: 08/16/2021 19:46   EEG adult  Result Date: 08/17/2021 Tsosie Billing, MD     08/17/2021 10:12 PM [facility] Routine EEG Report Patient Name:   Reinaldo Berber Date of Birth:   28-Apr-1956 Identification Number:   MRN - [id number] Date of Study:   08/17/2021 17:49:17 Duration: 23 min Indication: Encephalopathy, Technical Summary: A routine 20 channel electroencephalogram using the international 10-20 system of electrode placement was performed. Background: 5-6 Hz, Poorly formed States      Drowsy: were seen during drowsiness Abnormalities Generalized Slowing: Diffuse generalized slowing Activation Procedures Hyperventilation: Not performed Photic Stimulation: Not performed Classification: Abnormal : Diagnosis: This is abnormal EEG, the Presence of Generalized slowing is consistent with Encephalopathy Dr Tsosie Billing [facility] [phone] Case [id number]  ECHOCARDIOGRAM COMPLETE  Result Date: 08/17/2021    ECHOCARDIOGRAM REPORT  Patient Name:   DANAIYA STEADMAN Date of Exam: 08/17/2021 Medical Rec #:  [id number]      Height:       66.0 in Accession #:    [id number]     Weight:       127.0 lb Date of Birth:  [birth year]     BSA:          1.649 m Patient Age:    65 years       BP:           122/94 mmHg Patient Gender: F              HR:           101 bpm. [facility] Procedure: 2D Echo, Cardiac Doppler and Color Doppler Indications:    Stroke  History:        Patient has prior history of Echocardiogram examinations, most                 recent 07/06/2019. Risk Factors:Hypertension. ETOH Abuse.  Sonographer:    Mikki Harbor Referring Phys: [phone] ABRAHAM FELIZ ORTIZ IMPRESSIONS  1. Left ventricular ejection fraction, by estimation, is 65 to 70%. The left ventricle has normal function. The left  ventricle has no regional wall motion abnormalities. There is mild left ventricular hypertrophy. Left ventricular diastolic parameters are consistent with Grade I diastolic dysfunction (impaired relaxation).  2. Right ventricular systolic function is normal. The right ventricular size is normal. There is mildly elevated pulmonary artery systolic pressure.  3. The mitral valve is normal in structure. No evidence of mitral valve regurgitation. No evidence of mitral stenosis.  4. The aortic valve has an indeterminant number of cusps. Aortic valve regurgitation is not visualized. No aortic stenosis is present. FINDINGS  Left Ventricle: Left ventricular ejection fraction, by estimation, is 65 to 70%. The left ventricle has normal function. The left ventricle has no regional wall motion abnormalities. The left ventricular internal cavity size was normal in size. There is  mild left ventricular hypertrophy. Left ventricular diastolic parameters are consistent with Grade I diastolic dysfunction (impaired relaxation). Normal left ventricular filling pressure. Right Ventricle: The right ventricular size is normal. No increase in right ventricular wall thickness. Right ventricular systolic function is normal. There is mildly elevated pulmonary artery systolic pressure. The tricuspid regurgitant velocity is 2.82  m/s, and with an assumed right atrial pressure of 8 mmHg, the estimated right ventricular systolic pressure is 39.8 mmHg. Left Atrium: Left atrial size was normal in size. Right Atrium: Right atrial size was normal in size. Pericardium: There is no evidence of pericardial effusion. Mitral Valve: The mitral valve is normal in structure. No evidence of mitral valve regurgitation. No evidence of mitral valve stenosis. MV peak gradient, 8.0 mmHg. The mean mitral valve gradient is 3.0 mmHg. Tricuspid Valve: The tricuspid valve is not well visualized. Tricuspid valve regurgitation is mild . No evidence of tricuspid stenosis.  Aortic Valve: The aortic valve has an indeterminant number of cusps. Aortic valve regurgitation is not visualized. No aortic stenosis is present. Aortic valve mean gradient measures 3.0 mmHg. Aortic valve peak gradient measures 5.6 mmHg. Aortic valve area, by VTI measures 2.20 cm. Pulmonic Valve: The pulmonic valve was not well visualized. Pulmonic valve regurgitation is not visualized. No evidence of pulmonic stenosis. Aorta: The aortic root is normal in size and structure. IAS/Shunts: No atrial level shunt detected by color flow Doppler.  LEFT VENTRICLE PLAX 2D LVIDd:  3.10 cm     Diastology LVIDs:         2.00 cm     LV e' medial:    5.66 cm/s LV PW:         1.20 cm     LV E/e' medial:  11.8 LV IVS:        1.30 cm     LV e' lateral:   6.85 cm/s LVOT diam:     1.80 cm     LV E/e' lateral: 9.8 LV SV:         47 LV SV Index:   29 LVOT Area:     2.54 cm  LV Volumes (MOD) LV vol d, MOD A2C: 20.3 ml LV vol d, MOD A4C: 14.2 ml LV vol s, MOD A2C: 8.2 ml LV vol s, MOD A4C: 5.8 ml LV SV MOD A2C:     12.1 ml LV SV MOD A4C:     14.2 ml LV SV MOD BP:      10.1 ml RIGHT VENTRICLE RV Basal diam:  3.20 cm RV Mid diam:    2.80 cm RV S prime:     17.30 cm/s LEFT ATRIUM             Index        RIGHT ATRIUM           Index LA diam:        3.10 cm 1.88 cm/m   RA Area:     11.50 cm LA Vol (A2C):   30.9 ml 18.74 ml/m  RA Volume:   26.20 ml  15.89 ml/m LA Vol (A4C):   17.0 ml 10.31 ml/m LA Biplane Vol: 25.1 ml 15.22 ml/m  AORTIC VALVE                    PULMONIC VALVE AV Area (Vmax):    2.48 cm     PV Vmax:       0.75 m/s AV Area (Vmean):   2.42 cm     PV Peak grad:  2.3 mmHg AV Area (VTI):     2.20 cm AV Vmax:           118.00 cm/s AV Vmean:          79.000 cm/s AV VTI:            0.214 m AV Peak Grad:      5.6 mmHg AV Mean Grad:      3.0 mmHg LVOT Vmax:         115.00 cm/s LVOT Vmean:        75.100 cm/s LVOT VTI:          0.185 m LVOT/AV VTI ratio: 0.86  AORTA Ao Root diam: 3.10 cm Ao Asc diam:  3.20 cm MITRAL VALVE                 TRICUSPID VALVE MV Area (PHT): 5.97 cm     TR Peak grad:   31.8 mmHg MV Area VTI:   2.54 cm     TR Vmax:        282.00 cm/s MV Peak grad:  8.0 mmHg MV Mean grad:  3.0 mmHg     SHUNTS MV Vmax:       1.41 m/s     Systemic VTI:  0.18 m MV Vmean:      75.5 cm/s    Systemic Diam: 1.80 cm MV Decel Time: 127 msec MV E velocity:  66.80 cm/s MV A velocity: 112.00 cm/s MV E/A ratio:  0.60 Carlyle Dolly MD Electronically signed by Carlyle Dolly MD Signature Date/Time: 08/17/2021/3:31:03 PM    Final    CT HEAD CODE STROKE WO CONTRAST  Result Date: 08/13/2021 CLINICAL DATA:  Code stroke. Initial evaluation for acute stroke, left facial droop. EXAM: CT HEAD WITHOUT CONTRAST TECHNIQUE: Contiguous axial images were obtained from the base of the skull through the vertex without intravenous contrast. RADIATION DOSE REDUCTION: This exam was performed according to the departmental dose-optimization program which includes automated exposure control, adjustment of the mA and/or kV according to patient size and/or use of iterative reconstruction technique. COMPARISON:  Prior head CT from 08/04/2021. FINDINGS: Brain: Moderately advanced cerebral atrophy with chronic small vessel ischemic disease. No acute intracranial hemorrhage. No acute large vessel territory infarct. No mass lesion, midline shift or mass effect. No hydrocephalus or extra-axial fluid collection. Vascular: No hyperdense vessel. Calcified atherosclerosis present at the skull base. Skull: Scalp soft tissues and calvarium within normal limits. Sinuses/Orbits: Globes and orbital soft tissues demonstrate no acute finding. Visualized paranasal sinuses and mastoid air cells are clear. Other: None. ASPECTS [facility] Stroke Program Early CT Score) - Ganglionic level infarction (caudate, lentiform nuclei, internal capsule, insula, M1-M3 cortex): 7 - Supraganglionic infarction (M4-M6 cortex): 3 Total score (0-10 with 10 being normal): 10 IMPRESSION: 1. No acute  intracranial infarct or other abnormality. 2. ASPECTS is 10. 3. Moderately advanced cerebral atrophy with chronic small vessel ischemic disease. Results were called by telephone at the time of interpretation on 08/13/2021 at 10:52 pm to provider [facility] , who verbally acknowledged these results. Electronically Signed   By: Jeannine Boga M.D.   On: 08/13/2021 22:52   [place] EKG SITE RITE  Result Date: 08/14/2021 If Site Rite image not attached, placement could not be confirmed due to current cardiac rhythm.   Orson Eva, DO  [facility]  If 7PM-7AM, please contact night-coverage [email or web address] Password [email or web address] 08/29/2021, 7:24 AM   LOS: 4 days

## 2021-08-29 NOTE — Progress Notes (Signed)
ANTICOAGULATION CONSULT NOTE - Initial Consult  Pharmacy Consult for heparin gtt  Indication: DVT  Allergies  Allergen Reactions   Penicillins Rash    has tolerated ceftriaxone in the past    Patient Measurements: Height: 5\' 6"  (167.6 cm) Weight: 61.9 kg (136 lb 7.4 oz) IBW/kg (Calculated) : 59.3 Heparin Dosing Weight: HEPARIN DW (KG): 60.8   Vital Signs: Temp: 100.2 F (37.9 C) (01/28 1648) Temp Source: Axillary (01/28 1648) BP: 131/76 (01/28 1600) Pulse Rate: 124 (01/28 1648)  Labs: Recent Labs    08/27/21 0624 08/28/21 0603 08/29/21 0604 08/29/21 1526  HGB 8.6* 8.4* 8.6*  --   HCT 26.8* 25.5* 26.9*  --   PLT 265 281 339  --   HEPARINUNFRC  --   --   --  0.54  CREATININE 0.76 0.78 0.89  --      Estimated Creatinine Clearance: 58.2 mL/min (by C-G formula based on SCr of 0.89 mg/dL).   Medical History: Past Medical History:  Diagnosis Date   Anemia    Asthma    Chronic abdominal pain    Chronic back pain    Chronic constipation    Cirrhosis (HCC)    Metavir score F4 on elastography 2015   Cyclical vomiting    Fibroids    Fibromyalgia    GERD (gastroesophageal reflux disease)    H. pylori infection 2014   treated with pylera, had to be treated again as it was not eradicated. Urea breath test then negative after subsequent treatment.    [name]    HCV RNA positive 09/2012   Hypertension    Marijuana use    Nausea and vomiting    chronic, recurrent   PONV (postoperative nausea and vomiting)    pt thinks maybe once she had N&V   Sciatica of left side     Medications:  Medications Prior to Admission  Medication Sig Dispense Refill Last Dose   albuterol (VENTOLIN HFA) 108 (90 Base) MCG/ACT inhaler Inhale 2 puffs into the lungs every 6 (six) hours as needed for wheezing or shortness of breath. 18 g 2 unk   allopurinol (ZYLOPRIM) 300 MG tablet Take 300 mg by mouth daily.   Past Week   amLODipine (NORVASC) 5 MG tablet Take 1 tablet (5 mg total) by  mouth daily. 30 tablet 1 Past Week   aspirin EC 81 MG EC tablet Take 1 tablet (81 mg total) by mouth daily. Swallow whole. 30 tablet 11 Past Week   Cholecalciferol (DIALYVITE VITAMIN D3 MAX) 1.25 MG (50000 UT) TABS Take 1 tablet by mouth every 7 (seven) days.   Past Week   cyclobenzaprine (FLEXERIL) 5 MG tablet Take 5 mg by mouth 3 (three) times daily.   Past Week   folic acid (FOLVITE) 1 MG tablet Take 1 tablet (1 mg total) by mouth daily. 30 tablet 1 Past Week   gabapentin (NEURONTIN) 100 MG capsule Take 1 capsule (100 mg total) by mouth 3 (three) times daily. (Patient taking differently: Take 100 mg by mouth daily.) 90 capsule 2 Past Week   HYDROcodone-acetaminophen (NORCO/VICODIN) 5-325 MG tablet Take 1 tablet by mouth every 6 (six) hours as needed. (Patient taking differently: Take 1 tablet by mouth every 6 (six) hours as needed for moderate pain.) 8 tablet 0 Past Week   hydrOXYzine (ATARAX) 10 MG tablet Take 1 tablet (10 mg total) by mouth daily as needed. (Patient taking differently: Take 10 mg by mouth daily as needed for anxiety.) 30 tablet 0  Past Week   megestrol (MEGACE) 40 MG tablet Take 1 tablet (40 mg total) by mouth daily. 480 tablet 1 Past Week   metoprolol tartrate (LOPRESSOR) 50 MG tablet Take 1 tablet (50 mg total) by mouth 2 (two) times daily. 60 tablet 1 Past Week at unk   naloxone [facility]) nasal spray 4 mg/0.1 mL See admin instructions. ADMINISTER A SINGLE SPRAYEOF NARCAN INTO ONECNOSTRIL. REPEAT AFTER 3SMINUTES IN OTHER NOSTRIL IF NO RESPONSE.   unk   ondansetron (ZOFRAN) 4 MG tablet Take 1 tablet (4 mg total) by mouth every 6 (six) hours as needed for nausea. 20 tablet 0 unk   pantoprazole (PROTONIX) 40 MG tablet Take 1 tablet (40 mg total) by mouth 2 (two) times daily. 60 tablet 2 Past Week   Plecanatide (TRULANCE) 3 MG TABS Take 3 mg by mouth daily. 30 tablet 11 Past Week   potassium chloride (MICRO-K) 10 MEQ CR capsule Take 20 mEq by mouth daily.   Past Week   sodium  bicarbonate 650 MG tablet Take 1 tablet (650 mg total) by mouth 3 (three) times daily. 12 tablet 0 Past Week   lactulose (CHRONULAC) 10 GM/15ML solution Take 30 mLs (20 g total) by mouth 2 (two) times daily as needed for mild constipation or moderate constipation. (Patient not taking: Reported on 08/26/2021) 473 mL 1 Not Taking   Scheduled:   aspirin EC  81 mg Oral Daily   Chlorhexidine Gluconate Cloth  6 each Topical Daily   diltiazem  30 mg Oral Q6H   feeding supplement  237 mL Oral BID BM   folic acid  1 mg Oral Daily   guaiFENesin  600 mg Oral BID   ipratropium-albuterol  3 mL Nebulization TID   mouth rinse  15 mL Mouth Rinse BID   metoprolol tartrate  7.5 mg Intravenous Q6H   pantoprazole  40 mg Oral BID   phosphorus  500 mg Oral BID   potassium chloride  40 mEq Oral Daily   sodium bicarbonate  650 mg Oral TID   thiamine injection  100 mg Intravenous Daily   Infusions:   ceFEPime (MAXIPIME) IV     heparin 1,000 Units/hr (08/29/21 1005)   PRN: acetaminophen **OR** acetaminophen, albuterol, HYDROcodone-acetaminophen, hydrOXYzine, loperamide, ondansetron **OR** ondansetron (ZOFRAN) IV Anti-infectives (From admission, onward)    Start     Dose/Rate Route Frequency Ordered Stop   08/29/21 2200  ceFEPIme (MAXIPIME) 2 g in sodium chloride 0.9 % 100 mL IVPB        2 g 200 mL/hr over 30 Minutes Intravenous Every 12 hours 08/29/21 0959     08/26/21 2000  vancomycin (VANCOCIN) IVPB 1000 mg/200 mL premix  Status:  Discontinued       See Hyperspace for full Linked Orders Report.   1,000 mg 200 mL/hr over 60 Minutes Intravenous Every 24 hours 2021/09/06 1906 08/26/21 1016   08/26/21 1400  ceFEPIme (MAXIPIME) 2 g in sodium chloride 0.9 % 100 mL IVPB  Status:  Discontinued        2 g 200 mL/hr over 30 Minutes Intravenous Every 8 hours 08/26/21 0816 08/29/21 0959   September 06, 2021 2000  cefTRIAXone (ROCEPHIN) 1 g in sodium chloride 0.9 % 100 mL IVPB  Status:  Discontinued        1 g 200 mL/hr over  30 Minutes Intravenous Every 24 hours 2021/09/06 1853 2021-09-06 1855   September 06, 2021 2000  vancomycin (VANCOREADY) IVPB 1250 mg/250 mL       See Hyperspace for  full Linked Orders Report.   1,250 mg 166.7 mL/hr over 90 Minutes Intravenous  Once 09-12-2021 1906 09-12-21 2234   Sep 12, 2021 1930  ceFEPIme (MAXIPIME) 2 g in sodium chloride 0.9 % 100 mL IVPB  Status:  Discontinued        2 g 200 mL/hr over 30 Minutes Intravenous Every 12 hours 09-12-21 1900 08/26/21 0816   09/12/21 1845  aztreonam (AZACTAM) 2 g in sodium chloride 0.9 % 100 mL IVPB  Status:  Discontinued        2 g 200 mL/hr over 30 Minutes Intravenous  Once 09-12-21 1835 12-Sep-2021 1850       Assessment: Colleen Lee a 66 y.o. female requires anticoagulation with a heparin iv infusion for the indication of  DVT. Heparin gtt will be started following pharmacy protocol per pharmacy consult. Patient is not on previous oral anticoagulant that will require aPTT/HL correlation before transitioning to only HL monitoring.   HL 0.54, therapeutic   Goal of Therapy:  Heparin level 0.3-0.7 units/ml Monitor platelets by anticoagulation protocol: Yes   Plan:  Continue heparin infusion at 1000 units/hr Check anti-Xa level in 6 hours and daily while on heparin Continue to monitor H&H and platelets   Gerre Pebbles Viraj Liby 08/29/2021,5:04 PM

## 2021-08-29 NOTE — Progress Notes (Addendum)
Responded to nursing call:  patient less and less responsive.  Not able to tolerate po.  Continues to by tachypneic   Subjective: Patient is awake.  Speaks incomprehensibly most of the time.  Complains sob.  Nods yes when ask if having pain, but unable to clarify any further.  Unable to obtain other ROS  Vitals:   08/29/21 1200 08/29/21 1300 08/29/21 1344 08/29/21 1400  BP: 124/86 127/86  (!) 144/94  Pulse:    (!) 36  Resp: (!) 35 (!) 36  (!) 33  Temp:      TempSrc:      SpO2:   93% 100%  Weight:      Height:       CV--RRR Lung--scattered rhonchi Abd--soft+BS   Assessment/Plan: Goals of Care discussion -Brother Quillian Quince and Sister Tim Lair [facility]) at bedside -we discussed the patient's deteriorating condition (progressive declining mental status, continue tachycardia, tachypnea, hypoxia) and continued medical complications despite optimal medical therapy -we discussed that patient's overall prognosis in the setting of her poor baseline function and numerous co-morbidities including but not limited to 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 -discussed that even if patient were to improve clinically to be able to leave the hospital, she would not be the same person they knew her as in her pre-morbid state and that patient would be high risk for repeated hospitalizations.  We discussed her poor quality of life in her current state and that would continued to worsen Advance care planning, including the explanation and discussion of advance directives was carried out with the patient and family.  Code status including explanations of "Full Code" and "DNR" and alternatives were discussed in detail.  Discussion of end-of-life issues including but not limited palliative care, hospice care and the concept of hospice, other end-of-life care options, power of attorney for health care decisions, living wills, and physician orders for life-sustaining treatment were also discussed with the  patient and family.   -sister and brother expressed that they see patient is suffering -they expressed appreciation for the care patient has received -they stated that they understand that patient wound continue to have a poor quality of life and they do not want her to continue suffering -They wanted to change patient's focus of care to focus on comfort measures -they did not want any further blood draws, xrays, etc -They request to continue current level of care without any escalation until patient's other family members are able to visit and pay their last respects after which all medication with curative intent will be discontinued and focus will solely be on comfort measures.  Orson Eva, DO      Orson Eva, DO [facility]

## 2021-08-30 DIAGNOSIS — A419 Sepsis, unspecified organism: Secondary | ICD-10-CM | POA: Diagnosis not present

## 2021-08-30 DIAGNOSIS — J9621 Acute and chronic respiratory failure with hypoxia: Secondary | ICD-10-CM | POA: Diagnosis not present

## 2021-08-30 DIAGNOSIS — K7030 Alcoholic cirrhosis of liver without ascites: Secondary | ICD-10-CM | POA: Diagnosis not present

## 2021-08-30 DIAGNOSIS — G9341 Metabolic encephalopathy: Secondary | ICD-10-CM | POA: Diagnosis not present

## 2021-08-30 LAB — CULTURE, BLOOD (SINGLE): Special Requests: ADEQUATE

## 2021-08-30 MED ORDER — SCOPOLAMINE 1 MG/3DAYS TD PT72
1.0000 | MEDICATED_PATCH | TRANSDERMAL | Status: DC
  Filled 2021-08-30: qty 1

## 2021-08-30 MED ORDER — MORPHINE 100MG IN NS 100ML (1MG/ML) PREMIX INFUSION
1.0000 mg/h | INTRAVENOUS | Status: DC
  Administered 2021-08-30: 1 mg/h via INTRAVENOUS
  Administered 2021-08-31: 3 mg/h via INTRAVENOUS
  Filled 2021-08-30 (×2): qty 100

## 2021-08-30 MED ORDER — LORAZEPAM 2 MG/ML IJ SOLN: 1.0000 mg | INTRAMUSCULAR | Status: DC | PRN

## 2021-08-30 MED ORDER — GLYCOPYRROLATE 0.2 MG/ML IJ SOLN
0.1000 mg | INTRAMUSCULAR | Status: DC | PRN
  Administered 2021-09-01: 0.1 mg via INTRAVENOUS
  Filled 2021-08-30: qty 1

## 2021-08-30 NOTE — Progress Notes (Signed)
Patient moaning in a lot of pain. Secure chat sent to A[place] to increase her rate on the morphine drip. Received ordered. Rate changed to 3 mg/hr. Will continue to monitor.

## 2021-08-30 NOTE — Progress Notes (Signed)
Per pt's (poa) Colleen Lee, she has requested placement of comfort measures at this time as she discussed with Dr. Carles Collet today. Messaged DO. Adefeso with POA's request.

## 2021-08-30 NOTE — Progress Notes (Signed)
RN called to states that patient's power of attorney was willing to transition patient to comfort care.  I went to bedside along with RN and she (POA) confirmed request for the transition.  Comfort care measures was already discussed in detail with ED physician (Dr. Carles Collet), all questions were answered to satisfaction. Patient was transitioned to comfort care.

## 2021-08-30 NOTE — Progress Notes (Signed)
Wound care done at this time. Aquacel applied. Foam dressing in place. Will continue to monitor.

## 2021-08-30 NOTE — Progress Notes (Signed)
Medical Rec #:  [id number]      Height:       66.0 in Accession #:    [id number]     Weight:       127.0 lb Date of Birth:  1955-09-19     BSA:          1.649 m Patient Age:    66 years       BP:           122/94 mmHg Patient Gender: F              HR:           101 bpm. Exam Location:  [facility] Procedure: 2D Echo, Cardiac Doppler and Color Doppler  Indications:    Stroke  History:        Patient has prior history of Echocardiogram examinations, most                 recent 07/06/2019. Risk Factors:Hypertension. ETOH Abuse.  Sonographer:    Wenda Low Referring Phys: Carlos  1. Left ventricular ejection fraction, by estimation, is 65 to 70%. The left ventricle has normal function. The left ventricle has no regional wall motion abnormalities. There is mild left ventricular hypertrophy. Left ventricular diastolic parameters are consistent with Grade I diastolic dysfunction (impaired relaxation).  2. Right ventricular systolic function is normal. The right ventricular size is normal. There is mildly elevated pulmonary artery systolic pressure.  3. The mitral valve is normal in structure. No evidence of mitral valve regurgitation. No evidence of mitral stenosis.  4. The aortic valve has an indeterminant number of cusps. Aortic valve regurgitation is not visualized. No aortic stenosis is present. FINDINGS  Left Ventricle: Left ventricular ejection fraction, by estimation, is 65 to 70%. The left ventricle has normal function. The left ventricle has no regional wall motion abnormalities. The left ventricular internal cavity size was normal in size. There is  mild left ventricular hypertrophy. Left ventricular diastolic parameters are consistent with Grade I diastolic dysfunction (impaired relaxation). Normal left ventricular filling pressure. Right Ventricle: The right ventricular size is normal. No increase in right ventricular wall thickness. Right ventricular systolic function is normal. There is mildly elevated pulmonary artery systolic pressure. The tricuspid regurgitant velocity is 2.82  m/s, and with an assumed right atrial pressure of 8 mmHg, the estimated right ventricular systolic pressure is 10.3 mmHg. Left Atrium: Left atrial size was normal in size. Right Atrium: Right atrial size was normal in size. Pericardium: There is  no evidence of pericardial effusion. Mitral Valve: The mitral valve is normal in structure. No evidence of mitral valve regurgitation. No evidence of mitral valve stenosis. MV peak gradient, 8.0 mmHg. The mean mitral valve gradient is 3.0 mmHg. Tricuspid Valve: The tricuspid valve is not well visualized. Tricuspid valve regurgitation is mild . No evidence of tricuspid stenosis. Aortic Valve: The aortic valve has an indeterminant number of cusps. Aortic valve regurgitation is not visualized. No aortic stenosis is present. Aortic valve mean gradient measures 3.0 mmHg. Aortic valve peak gradient measures 5.6 mmHg. Aortic valve area, by VTI measures 2.20 cm. Pulmonic Valve: The pulmonic valve was not well visualized. Pulmonic valve regurgitation is not visualized. No evidence of pulmonic stenosis. Aorta: The aortic root is normal in size and structure. IAS/Shunts: No atrial level shunt detected by color flow Doppler.  LEFT VENTRICLE PLAX 2D LVIDd:         3.10 cm  Medical Rec #:  [id number]      Height:       66.0 in Accession #:    [id number]     Weight:       127.0 lb Date of Birth:  1955-09-19     BSA:          1.649 m Patient Age:    66 years       BP:           122/94 mmHg Patient Gender: F              HR:           101 bpm. Exam Location:  [facility] Procedure: 2D Echo, Cardiac Doppler and Color Doppler  Indications:    Stroke  History:        Patient has prior history of Echocardiogram examinations, most                 recent 07/06/2019. Risk Factors:Hypertension. ETOH Abuse.  Sonographer:    Wenda Low Referring Phys: Carlos  1. Left ventricular ejection fraction, by estimation, is 65 to 70%. The left ventricle has normal function. The left ventricle has no regional wall motion abnormalities. There is mild left ventricular hypertrophy. Left ventricular diastolic parameters are consistent with Grade I diastolic dysfunction (impaired relaxation).  2. Right ventricular systolic function is normal. The right ventricular size is normal. There is mildly elevated pulmonary artery systolic pressure.  3. The mitral valve is normal in structure. No evidence of mitral valve regurgitation. No evidence of mitral stenosis.  4. The aortic valve has an indeterminant number of cusps. Aortic valve regurgitation is not visualized. No aortic stenosis is present. FINDINGS  Left Ventricle: Left ventricular ejection fraction, by estimation, is 65 to 70%. The left ventricle has normal function. The left ventricle has no regional wall motion abnormalities. The left ventricular internal cavity size was normal in size. There is  mild left ventricular hypertrophy. Left ventricular diastolic parameters are consistent with Grade I diastolic dysfunction (impaired relaxation). Normal left ventricular filling pressure. Right Ventricle: The right ventricular size is normal. No increase in right ventricular wall thickness. Right ventricular systolic function is normal. There is mildly elevated pulmonary artery systolic pressure. The tricuspid regurgitant velocity is 2.82  m/s, and with an assumed right atrial pressure of 8 mmHg, the estimated right ventricular systolic pressure is 10.3 mmHg. Left Atrium: Left atrial size was normal in size. Right Atrium: Right atrial size was normal in size. Pericardium: There is  no evidence of pericardial effusion. Mitral Valve: The mitral valve is normal in structure. No evidence of mitral valve regurgitation. No evidence of mitral valve stenosis. MV peak gradient, 8.0 mmHg. The mean mitral valve gradient is 3.0 mmHg. Tricuspid Valve: The tricuspid valve is not well visualized. Tricuspid valve regurgitation is mild . No evidence of tricuspid stenosis. Aortic Valve: The aortic valve has an indeterminant number of cusps. Aortic valve regurgitation is not visualized. No aortic stenosis is present. Aortic valve mean gradient measures 3.0 mmHg. Aortic valve peak gradient measures 5.6 mmHg. Aortic valve area, by VTI measures 2.20 cm. Pulmonic Valve: The pulmonic valve was not well visualized. Pulmonic valve regurgitation is not visualized. No evidence of pulmonic stenosis. Aorta: The aortic root is normal in size and structure. IAS/Shunts: No atrial level shunt detected by color flow Doppler.  LEFT VENTRICLE PLAX 2D LVIDd:         3.10 cm  PROGRESS NOTE  Colleen Lee:[id number] DOB: 09-30-1955 DOA: 08/10/2021 PCP: Sandi Mariscal, MD   Brief History:  66 year old female with a history of alcoholic liver cirrhosis, fibromyalgia, hypertension, [name], anemia of chronic disease, vitamin deficiency presenting with altered mental status.  The patient was unable to provide any history, but there was some type of handwritten note accompanying the patient stating that the patient has been shivering with a fever up to 100.7 F and confusion. Notably, the patient had a prolonged hospitalization from 08/13/2021 to 08/23/2021.  During that hospitalization, the patient was treated for acute metabolic encephalopathy which is likely multifactorial including failure to thrive and numerous electrolyte and vitamin and mineral deficiencies.  She was also noted to have an incidental subacute stroke on MRI of the brain.  She was seen by neurology and recommended continuing aspirin 81 mg daily.  EEG was negative.  During the hospitalization, the patient received 1 unit PRBC without any evidence of ongoing blood loss.  PT recommended skilled nursing facility, but the patient was ultimately discharged home. In the ED, the patient was afebrile and tachycardic up to 130s.  He was hemodynamically stable.  Oxygen saturation was 97-100% on 3 L.  BMP shows sodium 139, potassium 3.4, bicarbonate 20, BUN 13, creatinine 0.0.  Ammonia 44, AST 34, ALT 19, alk phosphatase 255, total bilirubin 0.8.  Chest x-ray showed patchy right lower lobe airspace disease with diffuse interstitial GGO in the left thorax.  Patient was started on vancomycin and cefepime and lactated Ringer's.  Vancomycin was discontinued and cefepime was continued after MRSA screen was neg.  She developed fluid overload from fluid resuscitation and was given lasix IV x 2.  Pulmonary was consulted to assist and agreed with current management plan.   Assessment/Plan: Severe sepsis -Secondary to  bacteremia, pneumonia and UTI/pyelo -Presented with elevated lactate, tachycardia, leukocytosis -Continue vancomycin and cefepime pending culture data -Follow blood culture--neg to date -Follow urine culture--multiple organisms -Lactic acid peaked 4.6>>3.0>>2.1 -Check PCT-5.33>>3.66>>2.76   Lobar pneumonia -Suspect a degree of aspiration -MRSA screen--neg -initially started on empiric cefepime for now -now transitioned to comfort measures   Gram Negative Bacteremia -08/31/2021 blood culture--GNR -continue empiric cefepime pending culture data -now transitioned to comfort measures  Acute pulmonary Embolus -08/29/21 CTA chest--+PE, no RV strain -initially started on IV heparin -now transitioned to comfort measures   Acute respiratory failure with hypoxia -Secondary to pneumonia and pleural effusions and PE -COVID-19 PCR negative -Stable on 6 L>>3L -Wean oxygen as tolerated -repeat CXR--personally reviewed--increased interstitial markings, RUL opacity -lasix IV 40 mg on 1/26 and 1/27 -1/28--personally reviewed CXR--RUL opacity, stable increased interstitial markings -D-dimer 2.07>>>order CTA chest+PE --now transitioned to comfort measures   SVT/Atrial tachycardia -HR transiently up to 140s -Continue lopressor 5 mg IV q 6 -add diltiazem 30 mg po q 6 hours -1/25 Echo--EF 65-70%, no WMA, G1DD, RVSP 39.8 -TSH 1.183 --now transitioned to comfort measures   Pyuria -UA 11-20 WBC -Follow urine culture>>multiple organisms -Continue empiric antibiotics   Acute metabolic encephalopathy -Multifactorial including infectious process, vitamin and mineral deficiencies, dehydration -UA 11-20 WBC -TSH 1.183 -B12--1698 -08/18/2021 RPR negative -TSH--1.183 -mental status continued to decline -now transitioned to comfort measures   Alcoholic liver cirrhosis -INR 1.5   Hyperammonemia -Unclear clinical significance presently -Recheck ammonia--23 -Discontinue lactulose   Anemia of  chronic disease -04/21/1006 folic acid 5.5 -08/22/9756 B12 1658 -08/17/2028 thiamine 183.3 -Transfuse for hemoglobin less than 7 -Continue folic  Medical Rec #:  [id number]      Height:       66.0 in Accession #:    [id number]     Weight:       127.0 lb Date of Birth:  1955-09-19     BSA:          1.649 m Patient Age:    66 years       BP:           122/94 mmHg Patient Gender: F              HR:           101 bpm. Exam Location:  [facility] Procedure: 2D Echo, Cardiac Doppler and Color Doppler  Indications:    Stroke  History:        Patient has prior history of Echocardiogram examinations, most                 recent 07/06/2019. Risk Factors:Hypertension. ETOH Abuse.  Sonographer:    Wenda Low Referring Phys: Carlos  1. Left ventricular ejection fraction, by estimation, is 65 to 70%. The left ventricle has normal function. The left ventricle has no regional wall motion abnormalities. There is mild left ventricular hypertrophy. Left ventricular diastolic parameters are consistent with Grade I diastolic dysfunction (impaired relaxation).  2. Right ventricular systolic function is normal. The right ventricular size is normal. There is mildly elevated pulmonary artery systolic pressure.  3. The mitral valve is normal in structure. No evidence of mitral valve regurgitation. No evidence of mitral stenosis.  4. The aortic valve has an indeterminant number of cusps. Aortic valve regurgitation is not visualized. No aortic stenosis is present. FINDINGS  Left Ventricle: Left ventricular ejection fraction, by estimation, is 65 to 70%. The left ventricle has normal function. The left ventricle has no regional wall motion abnormalities. The left ventricular internal cavity size was normal in size. There is  mild left ventricular hypertrophy. Left ventricular diastolic parameters are consistent with Grade I diastolic dysfunction (impaired relaxation). Normal left ventricular filling pressure. Right Ventricle: The right ventricular size is normal. No increase in right ventricular wall thickness. Right ventricular systolic function is normal. There is mildly elevated pulmonary artery systolic pressure. The tricuspid regurgitant velocity is 2.82  m/s, and with an assumed right atrial pressure of 8 mmHg, the estimated right ventricular systolic pressure is 10.3 mmHg. Left Atrium: Left atrial size was normal in size. Right Atrium: Right atrial size was normal in size. Pericardium: There is  no evidence of pericardial effusion. Mitral Valve: The mitral valve is normal in structure. No evidence of mitral valve regurgitation. No evidence of mitral valve stenosis. MV peak gradient, 8.0 mmHg. The mean mitral valve gradient is 3.0 mmHg. Tricuspid Valve: The tricuspid valve is not well visualized. Tricuspid valve regurgitation is mild . No evidence of tricuspid stenosis. Aortic Valve: The aortic valve has an indeterminant number of cusps. Aortic valve regurgitation is not visualized. No aortic stenosis is present. Aortic valve mean gradient measures 3.0 mmHg. Aortic valve peak gradient measures 5.6 mmHg. Aortic valve area, by VTI measures 2.20 cm. Pulmonic Valve: The pulmonic valve was not well visualized. Pulmonic valve regurgitation is not visualized. No evidence of pulmonic stenosis. Aorta: The aortic root is normal in size and structure. IAS/Shunts: No atrial level shunt detected by color flow Doppler.  LEFT VENTRICLE PLAX 2D LVIDd:         3.10 cm  Medical Rec #:  [id number]      Height:       66.0 in Accession #:    [id number]     Weight:       127.0 lb Date of Birth:  1955-09-19     BSA:          1.649 m Patient Age:    66 years       BP:           122/94 mmHg Patient Gender: F              HR:           101 bpm. Exam Location:  [facility] Procedure: 2D Echo, Cardiac Doppler and Color Doppler  Indications:    Stroke  History:        Patient has prior history of Echocardiogram examinations, most                 recent 07/06/2019. Risk Factors:Hypertension. ETOH Abuse.  Sonographer:    Wenda Low Referring Phys: Carlos  1. Left ventricular ejection fraction, by estimation, is 65 to 70%. The left ventricle has normal function. The left ventricle has no regional wall motion abnormalities. There is mild left ventricular hypertrophy. Left ventricular diastolic parameters are consistent with Grade I diastolic dysfunction (impaired relaxation).  2. Right ventricular systolic function is normal. The right ventricular size is normal. There is mildly elevated pulmonary artery systolic pressure.  3. The mitral valve is normal in structure. No evidence of mitral valve regurgitation. No evidence of mitral stenosis.  4. The aortic valve has an indeterminant number of cusps. Aortic valve regurgitation is not visualized. No aortic stenosis is present. FINDINGS  Left Ventricle: Left ventricular ejection fraction, by estimation, is 65 to 70%. The left ventricle has normal function. The left ventricle has no regional wall motion abnormalities. The left ventricular internal cavity size was normal in size. There is  mild left ventricular hypertrophy. Left ventricular diastolic parameters are consistent with Grade I diastolic dysfunction (impaired relaxation). Normal left ventricular filling pressure. Right Ventricle: The right ventricular size is normal. No increase in right ventricular wall thickness. Right ventricular systolic function is normal. There is mildly elevated pulmonary artery systolic pressure. The tricuspid regurgitant velocity is 2.82  m/s, and with an assumed right atrial pressure of 8 mmHg, the estimated right ventricular systolic pressure is 10.3 mmHg. Left Atrium: Left atrial size was normal in size. Right Atrium: Right atrial size was normal in size. Pericardium: There is  no evidence of pericardial effusion. Mitral Valve: The mitral valve is normal in structure. No evidence of mitral valve regurgitation. No evidence of mitral valve stenosis. MV peak gradient, 8.0 mmHg. The mean mitral valve gradient is 3.0 mmHg. Tricuspid Valve: The tricuspid valve is not well visualized. Tricuspid valve regurgitation is mild . No evidence of tricuspid stenosis. Aortic Valve: The aortic valve has an indeterminant number of cusps. Aortic valve regurgitation is not visualized. No aortic stenosis is present. Aortic valve mean gradient measures 3.0 mmHg. Aortic valve peak gradient measures 5.6 mmHg. Aortic valve area, by VTI measures 2.20 cm. Pulmonic Valve: The pulmonic valve was not well visualized. Pulmonic valve regurgitation is not visualized. No evidence of pulmonic stenosis. Aorta: The aortic root is normal in size and structure. IAS/Shunts: No atrial level shunt detected by color flow Doppler.  LEFT VENTRICLE PLAX 2D LVIDd:         3.10 cm  Medical Rec #:  [id number]      Height:       66.0 in Accession #:    [id number]     Weight:       127.0 lb Date of Birth:  1955-09-19     BSA:          1.649 m Patient Age:    66 years       BP:           122/94 mmHg Patient Gender: F              HR:           101 bpm. Exam Location:  [facility] Procedure: 2D Echo, Cardiac Doppler and Color Doppler  Indications:    Stroke  History:        Patient has prior history of Echocardiogram examinations, most                 recent 07/06/2019. Risk Factors:Hypertension. ETOH Abuse.  Sonographer:    Wenda Low Referring Phys: Carlos  1. Left ventricular ejection fraction, by estimation, is 65 to 70%. The left ventricle has normal function. The left ventricle has no regional wall motion abnormalities. There is mild left ventricular hypertrophy. Left ventricular diastolic parameters are consistent with Grade I diastolic dysfunction (impaired relaxation).  2. Right ventricular systolic function is normal. The right ventricular size is normal. There is mildly elevated pulmonary artery systolic pressure.  3. The mitral valve is normal in structure. No evidence of mitral valve regurgitation. No evidence of mitral stenosis.  4. The aortic valve has an indeterminant number of cusps. Aortic valve regurgitation is not visualized. No aortic stenosis is present. FINDINGS  Left Ventricle: Left ventricular ejection fraction, by estimation, is 65 to 70%. The left ventricle has normal function. The left ventricle has no regional wall motion abnormalities. The left ventricular internal cavity size was normal in size. There is  mild left ventricular hypertrophy. Left ventricular diastolic parameters are consistent with Grade I diastolic dysfunction (impaired relaxation). Normal left ventricular filling pressure. Right Ventricle: The right ventricular size is normal. No increase in right ventricular wall thickness. Right ventricular systolic function is normal. There is mildly elevated pulmonary artery systolic pressure. The tricuspid regurgitant velocity is 2.82  m/s, and with an assumed right atrial pressure of 8 mmHg, the estimated right ventricular systolic pressure is 10.3 mmHg. Left Atrium: Left atrial size was normal in size. Right Atrium: Right atrial size was normal in size. Pericardium: There is  no evidence of pericardial effusion. Mitral Valve: The mitral valve is normal in structure. No evidence of mitral valve regurgitation. No evidence of mitral valve stenosis. MV peak gradient, 8.0 mmHg. The mean mitral valve gradient is 3.0 mmHg. Tricuspid Valve: The tricuspid valve is not well visualized. Tricuspid valve regurgitation is mild . No evidence of tricuspid stenosis. Aortic Valve: The aortic valve has an indeterminant number of cusps. Aortic valve regurgitation is not visualized. No aortic stenosis is present. Aortic valve mean gradient measures 3.0 mmHg. Aortic valve peak gradient measures 5.6 mmHg. Aortic valve area, by VTI measures 2.20 cm. Pulmonic Valve: The pulmonic valve was not well visualized. Pulmonic valve regurgitation is not visualized. No evidence of pulmonic stenosis. Aorta: The aortic root is normal in size and structure. IAS/Shunts: No atrial level shunt detected by color flow Doppler.  LEFT VENTRICLE PLAX 2D LVIDd:         3.10 cm  Medical Rec #:  [id number]      Height:       66.0 in Accession #:    [id number]     Weight:       127.0 lb Date of Birth:  1955-09-19     BSA:          1.649 m Patient Age:    66 years       BP:           122/94 mmHg Patient Gender: F              HR:           101 bpm. Exam Location:  [facility] Procedure: 2D Echo, Cardiac Doppler and Color Doppler  Indications:    Stroke  History:        Patient has prior history of Echocardiogram examinations, most                 recent 07/06/2019. Risk Factors:Hypertension. ETOH Abuse.  Sonographer:    Wenda Low Referring Phys: Carlos  1. Left ventricular ejection fraction, by estimation, is 65 to 70%. The left ventricle has normal function. The left ventricle has no regional wall motion abnormalities. There is mild left ventricular hypertrophy. Left ventricular diastolic parameters are consistent with Grade I diastolic dysfunction (impaired relaxation).  2. Right ventricular systolic function is normal. The right ventricular size is normal. There is mildly elevated pulmonary artery systolic pressure.  3. The mitral valve is normal in structure. No evidence of mitral valve regurgitation. No evidence of mitral stenosis.  4. The aortic valve has an indeterminant number of cusps. Aortic valve regurgitation is not visualized. No aortic stenosis is present. FINDINGS  Left Ventricle: Left ventricular ejection fraction, by estimation, is 65 to 70%. The left ventricle has normal function. The left ventricle has no regional wall motion abnormalities. The left ventricular internal cavity size was normal in size. There is  mild left ventricular hypertrophy. Left ventricular diastolic parameters are consistent with Grade I diastolic dysfunction (impaired relaxation). Normal left ventricular filling pressure. Right Ventricle: The right ventricular size is normal. No increase in right ventricular wall thickness. Right ventricular systolic function is normal. There is mildly elevated pulmonary artery systolic pressure. The tricuspid regurgitant velocity is 2.82  m/s, and with an assumed right atrial pressure of 8 mmHg, the estimated right ventricular systolic pressure is 10.3 mmHg. Left Atrium: Left atrial size was normal in size. Right Atrium: Right atrial size was normal in size. Pericardium: There is  no evidence of pericardial effusion. Mitral Valve: The mitral valve is normal in structure. No evidence of mitral valve regurgitation. No evidence of mitral valve stenosis. MV peak gradient, 8.0 mmHg. The mean mitral valve gradient is 3.0 mmHg. Tricuspid Valve: The tricuspid valve is not well visualized. Tricuspid valve regurgitation is mild . No evidence of tricuspid stenosis. Aortic Valve: The aortic valve has an indeterminant number of cusps. Aortic valve regurgitation is not visualized. No aortic stenosis is present. Aortic valve mean gradient measures 3.0 mmHg. Aortic valve peak gradient measures 5.6 mmHg. Aortic valve area, by VTI measures 2.20 cm. Pulmonic Valve: The pulmonic valve was not well visualized. Pulmonic valve regurgitation is not visualized. No evidence of pulmonic stenosis. Aorta: The aortic root is normal in size and structure. IAS/Shunts: No atrial level shunt detected by color flow Doppler.  LEFT VENTRICLE PLAX 2D LVIDd:         3.10 cm  Medical Rec #:  [id number]      Height:       66.0 in Accession #:    [id number]     Weight:       127.0 lb Date of Birth:  1955-09-19     BSA:          1.649 m Patient Age:    66 years       BP:           122/94 mmHg Patient Gender: F              HR:           101 bpm. Exam Location:  [facility] Procedure: 2D Echo, Cardiac Doppler and Color Doppler  Indications:    Stroke  History:        Patient has prior history of Echocardiogram examinations, most                 recent 07/06/2019. Risk Factors:Hypertension. ETOH Abuse.  Sonographer:    Wenda Low Referring Phys: Carlos  1. Left ventricular ejection fraction, by estimation, is 65 to 70%. The left ventricle has normal function. The left ventricle has no regional wall motion abnormalities. There is mild left ventricular hypertrophy. Left ventricular diastolic parameters are consistent with Grade I diastolic dysfunction (impaired relaxation).  2. Right ventricular systolic function is normal. The right ventricular size is normal. There is mildly elevated pulmonary artery systolic pressure.  3. The mitral valve is normal in structure. No evidence of mitral valve regurgitation. No evidence of mitral stenosis.  4. The aortic valve has an indeterminant number of cusps. Aortic valve regurgitation is not visualized. No aortic stenosis is present. FINDINGS  Left Ventricle: Left ventricular ejection fraction, by estimation, is 65 to 70%. The left ventricle has normal function. The left ventricle has no regional wall motion abnormalities. The left ventricular internal cavity size was normal in size. There is  mild left ventricular hypertrophy. Left ventricular diastolic parameters are consistent with Grade I diastolic dysfunction (impaired relaxation). Normal left ventricular filling pressure. Right Ventricle: The right ventricular size is normal. No increase in right ventricular wall thickness. Right ventricular systolic function is normal. There is mildly elevated pulmonary artery systolic pressure. The tricuspid regurgitant velocity is 2.82  m/s, and with an assumed right atrial pressure of 8 mmHg, the estimated right ventricular systolic pressure is 10.3 mmHg. Left Atrium: Left atrial size was normal in size. Right Atrium: Right atrial size was normal in size. Pericardium: There is  no evidence of pericardial effusion. Mitral Valve: The mitral valve is normal in structure. No evidence of mitral valve regurgitation. No evidence of mitral valve stenosis. MV peak gradient, 8.0 mmHg. The mean mitral valve gradient is 3.0 mmHg. Tricuspid Valve: The tricuspid valve is not well visualized. Tricuspid valve regurgitation is mild . No evidence of tricuspid stenosis. Aortic Valve: The aortic valve has an indeterminant number of cusps. Aortic valve regurgitation is not visualized. No aortic stenosis is present. Aortic valve mean gradient measures 3.0 mmHg. Aortic valve peak gradient measures 5.6 mmHg. Aortic valve area, by VTI measures 2.20 cm. Pulmonic Valve: The pulmonic valve was not well visualized. Pulmonic valve regurgitation is not visualized. No evidence of pulmonic stenosis. Aorta: The aortic root is normal in size and structure. IAS/Shunts: No atrial level shunt detected by color flow Doppler.  LEFT VENTRICLE PLAX 2D LVIDd:         3.10 cm  Medical Rec #:  [id number]      Height:       66.0 in Accession #:    [id number]     Weight:       127.0 lb Date of Birth:  1955-09-19     BSA:          1.649 m Patient Age:    66 years       BP:           122/94 mmHg Patient Gender: F              HR:           101 bpm. Exam Location:  [facility] Procedure: 2D Echo, Cardiac Doppler and Color Doppler  Indications:    Stroke  History:        Patient has prior history of Echocardiogram examinations, most                 recent 07/06/2019. Risk Factors:Hypertension. ETOH Abuse.  Sonographer:    Wenda Low Referring Phys: Carlos  1. Left ventricular ejection fraction, by estimation, is 65 to 70%. The left ventricle has normal function. The left ventricle has no regional wall motion abnormalities. There is mild left ventricular hypertrophy. Left ventricular diastolic parameters are consistent with Grade I diastolic dysfunction (impaired relaxation).  2. Right ventricular systolic function is normal. The right ventricular size is normal. There is mildly elevated pulmonary artery systolic pressure.  3. The mitral valve is normal in structure. No evidence of mitral valve regurgitation. No evidence of mitral stenosis.  4. The aortic valve has an indeterminant number of cusps. Aortic valve regurgitation is not visualized. No aortic stenosis is present. FINDINGS  Left Ventricle: Left ventricular ejection fraction, by estimation, is 65 to 70%. The left ventricle has normal function. The left ventricle has no regional wall motion abnormalities. The left ventricular internal cavity size was normal in size. There is  mild left ventricular hypertrophy. Left ventricular diastolic parameters are consistent with Grade I diastolic dysfunction (impaired relaxation). Normal left ventricular filling pressure. Right Ventricle: The right ventricular size is normal. No increase in right ventricular wall thickness. Right ventricular systolic function is normal. There is mildly elevated pulmonary artery systolic pressure. The tricuspid regurgitant velocity is 2.82  m/s, and with an assumed right atrial pressure of 8 mmHg, the estimated right ventricular systolic pressure is 10.3 mmHg. Left Atrium: Left atrial size was normal in size. Right Atrium: Right atrial size was normal in size. Pericardium: There is  no evidence of pericardial effusion. Mitral Valve: The mitral valve is normal in structure. No evidence of mitral valve regurgitation. No evidence of mitral valve stenosis. MV peak gradient, 8.0 mmHg. The mean mitral valve gradient is 3.0 mmHg. Tricuspid Valve: The tricuspid valve is not well visualized. Tricuspid valve regurgitation is mild . No evidence of tricuspid stenosis. Aortic Valve: The aortic valve has an indeterminant number of cusps. Aortic valve regurgitation is not visualized. No aortic stenosis is present. Aortic valve mean gradient measures 3.0 mmHg. Aortic valve peak gradient measures 5.6 mmHg. Aortic valve area, by VTI measures 2.20 cm. Pulmonic Valve: The pulmonic valve was not well visualized. Pulmonic valve regurgitation is not visualized. No evidence of pulmonic stenosis. Aorta: The aortic root is normal in size and structure. IAS/Shunts: No atrial level shunt detected by color flow Doppler.  LEFT VENTRICLE PLAX 2D LVIDd:         3.10 cm  PROGRESS NOTE  Colleen Lee:[id number] DOB: 09-30-1955 DOA: 08/10/2021 PCP: Sandi Mariscal, MD   Brief History:  66 year old female with a history of alcoholic liver cirrhosis, fibromyalgia, hypertension, [name], anemia of chronic disease, vitamin deficiency presenting with altered mental status.  The patient was unable to provide any history, but there was some type of handwritten note accompanying the patient stating that the patient has been shivering with a fever up to 100.7 F and confusion. Notably, the patient had a prolonged hospitalization from 08/13/2021 to 08/23/2021.  During that hospitalization, the patient was treated for acute metabolic encephalopathy which is likely multifactorial including failure to thrive and numerous electrolyte and vitamin and mineral deficiencies.  She was also noted to have an incidental subacute stroke on MRI of the brain.  She was seen by neurology and recommended continuing aspirin 81 mg daily.  EEG was negative.  During the hospitalization, the patient received 1 unit PRBC without any evidence of ongoing blood loss.  PT recommended skilled nursing facility, but the patient was ultimately discharged home. In the ED, the patient was afebrile and tachycardic up to 130s.  He was hemodynamically stable.  Oxygen saturation was 97-100% on 3 L.  BMP shows sodium 139, potassium 3.4, bicarbonate 20, BUN 13, creatinine 0.0.  Ammonia 44, AST 34, ALT 19, alk phosphatase 255, total bilirubin 0.8.  Chest x-ray showed patchy right lower lobe airspace disease with diffuse interstitial GGO in the left thorax.  Patient was started on vancomycin and cefepime and lactated Ringer's.  Vancomycin was discontinued and cefepime was continued after MRSA screen was neg.  She developed fluid overload from fluid resuscitation and was given lasix IV x 2.  Pulmonary was consulted to assist and agreed with current management plan.   Assessment/Plan: Severe sepsis -Secondary to  bacteremia, pneumonia and UTI/pyelo -Presented with elevated lactate, tachycardia, leukocytosis -Continue vancomycin and cefepime pending culture data -Follow blood culture--neg to date -Follow urine culture--multiple organisms -Lactic acid peaked 4.6>>3.0>>2.1 -Check PCT-5.33>>3.66>>2.76   Lobar pneumonia -Suspect a degree of aspiration -MRSA screen--neg -initially started on empiric cefepime for now -now transitioned to comfort measures   Gram Negative Bacteremia -08/31/2021 blood culture--GNR -continue empiric cefepime pending culture data -now transitioned to comfort measures  Acute pulmonary Embolus -08/29/21 CTA chest--+PE, no RV strain -initially started on IV heparin -now transitioned to comfort measures   Acute respiratory failure with hypoxia -Secondary to pneumonia and pleural effusions and PE -COVID-19 PCR negative -Stable on 6 L>>3L -Wean oxygen as tolerated -repeat CXR--personally reviewed--increased interstitial markings, RUL opacity -lasix IV 40 mg on 1/26 and 1/27 -1/28--personally reviewed CXR--RUL opacity, stable increased interstitial markings -D-dimer 2.07>>>order CTA chest+PE --now transitioned to comfort measures   SVT/Atrial tachycardia -HR transiently up to 140s -Continue lopressor 5 mg IV q 6 -add diltiazem 30 mg po q 6 hours -1/25 Echo--EF 65-70%, no WMA, G1DD, RVSP 39.8 -TSH 1.183 --now transitioned to comfort measures   Pyuria -UA 11-20 WBC -Follow urine culture>>multiple organisms -Continue empiric antibiotics   Acute metabolic encephalopathy -Multifactorial including infectious process, vitamin and mineral deficiencies, dehydration -UA 11-20 WBC -TSH 1.183 -B12--1698 -08/18/2021 RPR negative -TSH--1.183 -mental status continued to decline -now transitioned to comfort measures   Alcoholic liver cirrhosis -INR 1.5   Hyperammonemia -Unclear clinical significance presently -Recheck ammonia--23 -Discontinue lactulose   Anemia of  chronic disease -04/21/1006 folic acid 5.5 -08/22/9756 B12 1658 -08/17/2028 thiamine 183.3 -Transfuse for hemoglobin less than 7 -Continue folic  Medical Rec #:  [id number]      Height:       66.0 in Accession #:    [id number]     Weight:       127.0 lb Date of Birth:  1955-09-19     BSA:          1.649 m Patient Age:    66 years       BP:           122/94 mmHg Patient Gender: F              HR:           101 bpm. Exam Location:  [facility] Procedure: 2D Echo, Cardiac Doppler and Color Doppler  Indications:    Stroke  History:        Patient has prior history of Echocardiogram examinations, most                 recent 07/06/2019. Risk Factors:Hypertension. ETOH Abuse.  Sonographer:    Wenda Low Referring Phys: Carlos  1. Left ventricular ejection fraction, by estimation, is 65 to 70%. The left ventricle has normal function. The left ventricle has no regional wall motion abnormalities. There is mild left ventricular hypertrophy. Left ventricular diastolic parameters are consistent with Grade I diastolic dysfunction (impaired relaxation).  2. Right ventricular systolic function is normal. The right ventricular size is normal. There is mildly elevated pulmonary artery systolic pressure.  3. The mitral valve is normal in structure. No evidence of mitral valve regurgitation. No evidence of mitral stenosis.  4. The aortic valve has an indeterminant number of cusps. Aortic valve regurgitation is not visualized. No aortic stenosis is present. FINDINGS  Left Ventricle: Left ventricular ejection fraction, by estimation, is 65 to 70%. The left ventricle has normal function. The left ventricle has no regional wall motion abnormalities. The left ventricular internal cavity size was normal in size. There is  mild left ventricular hypertrophy. Left ventricular diastolic parameters are consistent with Grade I diastolic dysfunction (impaired relaxation). Normal left ventricular filling pressure. Right Ventricle: The right ventricular size is normal. No increase in right ventricular wall thickness. Right ventricular systolic function is normal. There is mildly elevated pulmonary artery systolic pressure. The tricuspid regurgitant velocity is 2.82  m/s, and with an assumed right atrial pressure of 8 mmHg, the estimated right ventricular systolic pressure is 10.3 mmHg. Left Atrium: Left atrial size was normal in size. Right Atrium: Right atrial size was normal in size. Pericardium: There is  no evidence of pericardial effusion. Mitral Valve: The mitral valve is normal in structure. No evidence of mitral valve regurgitation. No evidence of mitral valve stenosis. MV peak gradient, 8.0 mmHg. The mean mitral valve gradient is 3.0 mmHg. Tricuspid Valve: The tricuspid valve is not well visualized. Tricuspid valve regurgitation is mild . No evidence of tricuspid stenosis. Aortic Valve: The aortic valve has an indeterminant number of cusps. Aortic valve regurgitation is not visualized. No aortic stenosis is present. Aortic valve mean gradient measures 3.0 mmHg. Aortic valve peak gradient measures 5.6 mmHg. Aortic valve area, by VTI measures 2.20 cm. Pulmonic Valve: The pulmonic valve was not well visualized. Pulmonic valve regurgitation is not visualized. No evidence of pulmonic stenosis. Aorta: The aortic root is normal in size and structure. IAS/Shunts: No atrial level shunt detected by color flow Doppler.  LEFT VENTRICLE PLAX 2D LVIDd:         3.10 cm  Medical Rec #:  [id number]      Height:       66.0 in Accession #:    [id number]     Weight:       127.0 lb Date of Birth:  1955-09-19     BSA:          1.649 m Patient Age:    66 years       BP:           122/94 mmHg Patient Gender: F              HR:           101 bpm. Exam Location:  [facility] Procedure: 2D Echo, Cardiac Doppler and Color Doppler  Indications:    Stroke  History:        Patient has prior history of Echocardiogram examinations, most                 recent 07/06/2019. Risk Factors:Hypertension. ETOH Abuse.  Sonographer:    Wenda Low Referring Phys: Carlos  1. Left ventricular ejection fraction, by estimation, is 65 to 70%. The left ventricle has normal function. The left ventricle has no regional wall motion abnormalities. There is mild left ventricular hypertrophy. Left ventricular diastolic parameters are consistent with Grade I diastolic dysfunction (impaired relaxation).  2. Right ventricular systolic function is normal. The right ventricular size is normal. There is mildly elevated pulmonary artery systolic pressure.  3. The mitral valve is normal in structure. No evidence of mitral valve regurgitation. No evidence of mitral stenosis.  4. The aortic valve has an indeterminant number of cusps. Aortic valve regurgitation is not visualized. No aortic stenosis is present. FINDINGS  Left Ventricle: Left ventricular ejection fraction, by estimation, is 65 to 70%. The left ventricle has normal function. The left ventricle has no regional wall motion abnormalities. The left ventricular internal cavity size was normal in size. There is  mild left ventricular hypertrophy. Left ventricular diastolic parameters are consistent with Grade I diastolic dysfunction (impaired relaxation). Normal left ventricular filling pressure. Right Ventricle: The right ventricular size is normal. No increase in right ventricular wall thickness. Right ventricular systolic function is normal. There is mildly elevated pulmonary artery systolic pressure. The tricuspid regurgitant velocity is 2.82  m/s, and with an assumed right atrial pressure of 8 mmHg, the estimated right ventricular systolic pressure is 10.3 mmHg. Left Atrium: Left atrial size was normal in size. Right Atrium: Right atrial size was normal in size. Pericardium: There is  no evidence of pericardial effusion. Mitral Valve: The mitral valve is normal in structure. No evidence of mitral valve regurgitation. No evidence of mitral valve stenosis. MV peak gradient, 8.0 mmHg. The mean mitral valve gradient is 3.0 mmHg. Tricuspid Valve: The tricuspid valve is not well visualized. Tricuspid valve regurgitation is mild . No evidence of tricuspid stenosis. Aortic Valve: The aortic valve has an indeterminant number of cusps. Aortic valve regurgitation is not visualized. No aortic stenosis is present. Aortic valve mean gradient measures 3.0 mmHg. Aortic valve peak gradient measures 5.6 mmHg. Aortic valve area, by VTI measures 2.20 cm. Pulmonic Valve: The pulmonic valve was not well visualized. Pulmonic valve regurgitation is not visualized. No evidence of pulmonic stenosis. Aorta: The aortic root is normal in size and structure. IAS/Shunts: No atrial level shunt detected by color flow Doppler.  LEFT VENTRICLE PLAX 2D LVIDd:         3.10 cm

## 2021-08-30 NOTE — Progress Notes (Signed)
Unable to do wound care at this time. [name] family prefers for patient to be more comfortable before preforming this. Will continue to monitor.

## 2021-08-31 NOTE — Progress Notes (Signed)
Found patient non responsive with husband, brother and sister present bedside today. Chaplain engaged them in reflection around her life, upbringing, and spiritual heritage. Chaplain provided space for life review and blessing. Will remain available in order to provide spiritual support and to assess for spiritual need.   Rev. Bennie Pierini, M.[employer] [phone]

## 2021-08-31 NOTE — Progress Notes (Signed)
Patient in no obvious signs of distress.  Patient has been resting comfortably.  Patient does have periods of apnea and oxygen saturations have decreased.  Family at bedside all night.

## 2021-08-31 NOTE — Progress Notes (Signed)
PROGRESS NOTE  Colleen Lee WNU:[id number] DOB: [birth year] DOA: 08-26-2021 PCP: Salli Real, MD  Brief History:  66 year old female with a history of alcoholic liver cirrhosis, fibromyalgia, hypertension, [name], anemia of chronic disease, vitamin deficiency presenting with altered mental status.  The patient was unable to provide any history, but there was some type of handwritten note accompanying the patient stating that the patient has been shivering with a fever up to 100.7 F and confusion. Notably, the patient had a prolonged hospitalization from 08/13/2021 to 08/23/2021.  During that hospitalization, the patient was treated for acute metabolic encephalopathy which is likely multifactorial including failure to thrive and numerous electrolyte and vitamin and mineral deficiencies.  She was also noted to have an incidental subacute stroke on MRI of the brain.  She was seen by neurology and recommended continuing aspirin 81 mg daily.  EEG was negative.  During the hospitalization, the patient received 1 unit PRBC without any evidence of ongoing blood loss.  PT recommended skilled nursing facility, but the patient was ultimately discharged home. In the ED, the patient was afebrile and tachycardic up to 130s.  He was hemodynamically stable.  Oxygen saturation was 97-100% on 3 L.  BMP shows sodium 139, potassium 3.4, bicarbonate 20, BUN 13, creatinine 0.0.  Ammonia 44, AST 34, ALT 19, alk phosphatase 255, total bilirubin 0.8.  Chest x-ray showed patchy right lower lobe airspace disease with diffuse interstitial GGO in the left thorax.  Patient was started on vancomycin and cefepime and lactated Ringer's.  Vancomycin was discontinued and cefepime was continued after MRSA screen was neg.  She developed fluid overload from fluid resuscitation and was given lasix IV x 2.  Pulmonary was consulted to assist and agreed with current management plan.  Unfortunately, the patient's clinical condition  continued to deteriorate despite optimal medical therapy.  GOC discussion was held with family and POA.   Given the patient's overall poor prognosis, comfort measures were instituted.   Assessment/Plan: Severe sepsis -Secondary to bacteremia, pneumonia and UTI/pyelo -Presented with elevated lactate, tachycardia, leukocytosis -Initially on vancomycin and cefepime pending culture data  -Follow blood culture--neg to date -Follow urine culture--multiple organisms -Lactic acid peaked 4.6>>3.0>>2.1 -Check PCT-5.33>>3.66>>2.76 -now focused on comfort care measures   Lobar pneumonia -Suspect a degree of aspiration -MRSA screen--neg -initially started on empiric cefepime for now -now transitioned to comfort measures   Gram Negative Bacteremia -Aug 26, 2021 blood culture--GNR -continue empiric cefepime pending culture data -now transitioned to comfort measures   Acute pulmonary Embolus -08/29/21 CTA chest--+PE, no RV strain -initially started on IV heparin -now transitioned to comfort measures   Acute respiratory failure with hypoxia -Secondary to pneumonia and pleural effusions and PE -COVID-19 PCR negative -Stable on 6 L>>3L -Wean oxygen as tolerated -repeat CXR--personally reviewed--increased interstitial markings, RUL opacity -lasix IV 40 mg on 1/26 and 1/27 -1/28--personally reviewed CXR--RUL opacity, stable increased interstitial markings -D-dimer 2.07>>>order CTA chest+PE --now transitioned to comfort measures   SVT/Atrial tachycardia -HR transiently up to 140s -Continue lopressor 5 mg IV q 6 -add diltiazem 30 mg po q 6 hours -1/25 Echo--EF 65-70%, no WMA, G1DD, RVSP 39.8 -TSH 1.183 --now transitioned to comfort measures   Pyuria -UA 11-20 WBC -Follow urine culture>>multiple organisms -Continue empiric antibiotics   Acute metabolic encephalopathy -Multifactorial including infectious process, vitamin and mineral deficiencies, dehydration -UA 11-20 WBC -TSH  1.183 -B12--1698 -08/18/2021 RPR negative -TSH--1.183 -mental status continued to decline -now transitioned to comfort measures  Alcoholic liver cirrhosis -INR 1.5   Hyperammonemia -Unclear clinical significance presently -Recheck ammonia--23 -Discontinue lactulose   Anemia of chronic disease -08/14/2021 folic acid 5.5 -08/14/2021 B12 1658 -08/17/2028 thiamine 183.3 -Transfuse for hemoglobin less than 7 -Continue folic acid and thiamine supplementation   Hypomagnesemia/hypophosphatemia -Repleted   Hypokalemia -Repleted   Stage 4 sacral decubitus -present on admission -appreciate WOCN recommendation -CT abd/pelvis--New small peripheral areas of decreased enhancement in right kidney,which may be due to small renal infarcts or pyelonephritis. Bibasilar pulmonary airspace opacity, with small bilateral pleural effusions. +anasarca -now transitioned to comfort measures   Severe Malnutrition -nutritional supplements     Goals of Care -Brother [name] and Sister Tonna [facility] at bedside -we discussed the patient's deteriorating condition (progressive declining mental status, continue tachycardia, tachypnea, hypoxia) and continued medical complications despite optimal medical therapy -we discussed that patient's overall prognosis in the setting of her poor baseline function and numerous co-morbidities including but not limited to 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 -discussed that even if patient were to improve clinically to be able to leave the hospital, she would not be the same person they knew her as in her pre-morbid state and that patient would be high risk for repeated hospitalizations.  We discussed her poor quality of life in her current state and that would continued to worsen Advance care planning, including the explanation and discussion of advance directives was carried out with the patient and family.  Code status including explanations of "Full Code"  and "DNR" and alternatives were discussed in detail.  Discussion of end-of-life issues including but not limited palliative care, hospice care and the concept of hospice, other end-of-life care options, power of attorney for health care decisions, living wills, and physician orders for life-sustaining treatment were also discussed with the patient and family.   -sister and brother expressed that they see patient is suffering -they expressed appreciation for the care patient has received -they stated that they understand that patient wound continue to have a poor quality of life and they do not want her to continue suffering -They wanted to change patient's focus of care to focus on comfort measures -they did not want any further blood draws, xrays, etc -They request to continue current level of care without any escalation until patient's other family members are able to visit and pay their last respects after which all medication with curative intent will be discontinued and focus will solely be on comfort measures.           Subjective:  Patient minimally responsive to voice.  Resting comfortably.  No vomiting or resp distress. Objective: Vitals:   08/29/21 0630 08/31/21 0514 08/31/21 1045 08/31/21 1816  BP:  (!) 87/68 100/71 (!) 83/57  Pulse:  (!) 131 (!) 134 (!) 135  Resp:  12 (!) 6 (!) 5  Temp:  (!) 100.9 F (38.3 C) (!) 102.1 F (38.9 C) (!) 102.5 F (39.2 C)  TempSrc:  Oral Axillary Axillary  SpO2:  (!) 68% (!) 84% 92%  Weight: 61.9 kg     Height:       No intake or output data in the 24 hours ending 08/31/21 1822 Weight change:  Exam:  General:  Pt is somnolent, not in acute distress HEENT: No icterus, [place]/AT Cardiovascular: RRR,  Respiratory: bilateral rhonchi Abdomen: Soft/+BS, non distended Extremities: 1 +LE edema, No lymphangitis, No petechiae, No rashes, no synovitis   Data Reviewed: I have personally reviewed following labs and imaging studies  Basic  Metabolic Panel: Recent Labs  Lab September 01, 2021 1752 08/26/21 0406 08/27/21 1610 08/28/21 0603 08/28/21 2134 08/29/21 0604  NA 142 139 140 138  --  139  K 4.1 3.4* 2.7* 2.6* 4.3 3.8  CL 108 112* 111 110  --  113*  CO2 21* 20* 20* 20*  --  19*  GLUCOSE 111* 130* 132* 119*  --  102*  BUN 17 13 17 16   --  14  CREATININE 1.02* 0.80 0.76 0.78  --  0.89  CALCIUM 9.8 8.7* 9.5 8.7*  --  9.5  MG  --  1.5* 2.1 1.7  --  2.1  PHOS  --   --   --   --   --  1.3*   Liver Function Tests: Recent Labs  Lab 08/18/2021 1752 08/26/21 0406 08/27/21 0624  AST 54* 34 40  ALT 25 19 19   ALKPHOS 424* 255* 277*  BILITOT 1.2 0.8 0.6  PROT 6.4* 4.8* 5.3*  ALBUMIN 1.7* 1.6* 1.8*   No results for input(s): LIPASE, AMYLASE in the last 168 hours. Recent Labs  Lab Sep 01, 2021 1752 08/27/21 0624  AMMONIA 44* 23   Coagulation Profile: Recent Labs  Lab 08/26/21 0406  INR 1.5*   CBC: Recent Labs  Lab 01-Sep-2021 1752 08/26/21 0406 08/27/21 0624 08/28/21 0603 08/29/21 0604  WBC 13.6* 10.8* 8.4 9.5 9.2  NEUTROABS 12.2* 10.4*  --   --   --   HGB 10.4* 7.5* 8.6* 8.4* 8.6*  HCT 31.0* 23.3* 26.8* 25.5* 26.9*  MCV 97.8 98.3 97.8 98.8 97.8  PLT 287 214 265 281 339   Cardiac Enzymes: No results for input(s): CKTOTAL, CKMB, CKMBINDEX, TROPONINI in the last 168 hours. BNP: Invalid input(s): POCBNP CBG: No results for input(s): GLUCAP in the last 168 hours. HbA1C: No results for input(s): HGBA1C in the last 72 hours. Urine analysis:    Component Value Date/Time   COLORURINE AMBER (A) September 01, 2021 1721   APPEARANCEUR HAZY (A) 08/05/2021 1721   LABSPEC 1.016 Sep 01, 2021 1721   PHURINE 5.0 September 01, 2021 1721   GLUCOSEU NEGATIVE 08/11/2021 1721   HGBUR NEGATIVE 08/16/2021 1721   BILIRUBINUR NEGATIVE 08/31/2021 1721   KETONESUR NEGATIVE 08/23/2021 1721   PROTEINUR 30 (A) 08/10/2021 1721   UROBILINOGEN 0.2 04/01/2015 1140   NITRITE POSITIVE (A) 08/19/2021 1721   LEUKOCYTESUR MODERATE (A) September 01, 2021 1721    Sepsis Labs: @LABRCNTIP (procalcitonin:4,lacticidven:4) ) Recent Results (from the past 240 hour(s))  Urine Culture     Status: Abnormal   Collection Time: 08/23/2021  3:30 AM   Specimen: Urine, Clean Catch  Result Value Ref Range Status   Specimen Description   Final    URINE, CLEAN CATCH Performed at [facility], [address] [place], [place] [address]    Special Requests   Final    NONE Performed at [facility], [address], [place], [place] [address]    Culture (A)  Final    >=100,000 COLONIES/mL MULTIPLE SPECIES PRESENT, SUGGEST RECOLLECTION   Report Status 08/27/2021 FINAL  Final  Culture, blood (single)     Status: None   Collection Time: 08/04/2021  5:52 PM   Specimen: BLOOD LEFT ARM  Result Value Ref Range Status   Specimen Description   Final    BLOOD LEFT ARM Performed at [facility], [address] [place], [place] [address]    Special Requests   Final    BOTTLES DRAWN AEROBIC AND ANAEROBIC Blood Culture adequate volume Performed at [facility], [address], [place], [place] [address]    Culture  Setup Time   Final    GRAM NEGATIVE RODS anaerobic bottle Gram Stain Report Called to,Read Back By and Verified With: Tinajero,[email or web address]  by Ashley Royalty, b 1.27.23    Culture   Final    BACTEROIDES PYOGENES BETA LACTAMASE NEGATIVE Performed at [facility], [address], [place], [place] [address]    Report Status 08/30/2021 FINAL  Final  Blood Culture ID Panel (Reflexed)     Status: None   Collection Time: 09/10/21  5:52 PM  Result Value Ref Range Status   Enterococcus faecalis NOT DETECTED NOT DETECTED Final   Enterococcus Faecium NOT DETECTED NOT DETECTED Final   Listeria monocytogenes NOT DETECTED NOT DETECTED Final   Staphylococcus species NOT DETECTED NOT DETECTED Final   Staphylococcus aureus (BCID) NOT DETECTED NOT DETECTED Final   Staphylococcus epidermidis NOT DETECTED NOT DETECTED Final   Staphylococcus lugdunensis NOT DETECTED NOT DETECTED  Final   Streptococcus species NOT DETECTED NOT DETECTED Final   Streptococcus agalactiae NOT DETECTED NOT DETECTED Final   Streptococcus pneumoniae NOT DETECTED NOT DETECTED Final   Streptococcus pyogenes NOT DETECTED NOT DETECTED Final   A.calcoaceticus-baumannii NOT DETECTED NOT DETECTED Final   Bacteroides fragilis NOT DETECTED NOT DETECTED Final   Enterobacterales NOT DETECTED NOT DETECTED Final   Enterobacter cloacae complex NOT DETECTED NOT DETECTED Final   Escherichia coli NOT DETECTED NOT DETECTED Final   Klebsiella aerogenes NOT DETECTED NOT DETECTED Final   Klebsiella oxytoca NOT DETECTED NOT DETECTED Final   Klebsiella pneumoniae NOT DETECTED NOT DETECTED Final   Proteus species NOT DETECTED NOT DETECTED Final   Salmonella species NOT DETECTED NOT DETECTED Final   Serratia marcescens NOT DETECTED NOT DETECTED Final   Haemophilus influenzae NOT DETECTED NOT DETECTED Final   Neisseria meningitidis NOT DETECTED NOT DETECTED Final   Pseudomonas aeruginosa NOT DETECTED NOT DETECTED Final   Stenotrophomonas maltophilia NOT DETECTED NOT DETECTED Final   Candida albicans NOT DETECTED NOT DETECTED Final   Candida auris NOT DETECTED NOT DETECTED Final   Candida glabrata NOT DETECTED NOT DETECTED Final   Candida krusei NOT DETECTED NOT DETECTED Final   Candida parapsilosis NOT DETECTED NOT DETECTED Final   Candida tropicalis NOT DETECTED NOT DETECTED Final   Cryptococcus neoformans/gattii NOT DETECTED NOT DETECTED Final    Comment: Performed at [facility], [address] [place], [place] [address]  Resp Panel by RT-PCR (Flu A&B, Covid) Nasopharyngeal Swab     Status: None   Collection Time: 10-Sep-2021  7:14 PM   Specimen: Nasopharyngeal Swab; Nasopharyngeal(NP) swabs in vial transport medium  Result Value Ref Range Status   SARS Coronavirus 2 by RT PCR NEGATIVE NEGATIVE Final    Comment: (NOTE) SARS-CoV-2 target nucleic acids are NOT DETECTED.  The SARS-CoV-2 RNA is  generally detectable in upper respiratory specimens during the acute phase of infection. The lowest concentration of SARS-CoV-2 viral copies this assay can detect is 138 copies/mL. A negative result does not preclude SARS-Cov-2 infection and should not be used as the sole basis for treatment or other patient management decisions. A negative result may occur with  improper specimen collection/handling, submission of specimen other than nasopharyngeal swab, presence of viral mutation(s) within the areas targeted by this assay, and inadequate number of viral copies(<138 copies/mL). A negative result must be combined with clinical observations, patient history, and epidemiological information. The expected result is Negative.  Fact Sheet for Patients:  BloggerCourse.com  Fact Sheet for  Healthcare Providers:  SeriousBroker.it  This test is no t yet approved or cleared by the [place] and  has been authorized for detection and/or diagnosis of SARS-CoV-2 by FDA under an Emergency Use Authorization (EUA). This EUA will remain  in effect (meaning this test can be used) for the duration of the COVID-19 declaration under Section 564(b)(1) of the Act, 21 U.S.C.section 360bbb-3(b)(1), unless the authorization is terminated  or revoked sooner.       Influenza A by PCR NEGATIVE NEGATIVE Final   Influenza B by PCR NEGATIVE NEGATIVE Final    Comment: (NOTE) The Xpert Xpress SARS-CoV-2/FLU/RSV plus assay is intended as an aid in the diagnosis of influenza from Nasopharyngeal swab specimens and should not be used as a sole basis for treatment. Nasal washings and aspirates are unacceptable for Xpert Xpress SARS-CoV-2/FLU/RSV testing.  Fact Sheet for Patients: BloggerCourse.com  Fact Sheet for Healthcare Providers: SeriousBroker.it  This test is not yet approved or cleared by the [place] FDA and has been authorized for detection and/or diagnosis of SARS-CoV-2 by FDA under an Emergency Use Authorization (EUA). This EUA will remain in effect (meaning this test can be used) for the duration of the COVID-19 declaration under Section 564(b)(1) of the Act, 21 U.S.C. section 360bbb-3(b)(1), unless the authorization is terminated or revoked.  Performed at [facility], [address], [place], [place] [address]   MRSA Next Gen by PCR, Nasal     Status: None   Collection Time: 08/26/21 12:00 AM   Specimen: Nasal Mucosa; Nasal Swab  Result Value Ref Range Status   MRSA by PCR Next Gen NOT DETECTED NOT DETECTED Final    Comment: (NOTE) The GeneXpert MRSA Assay (FDA approved for NASAL specimens only), is one component of a comprehensive MRSA colonization surveillance program. It is not intended to diagnose MRSA infection nor to guide or monitor treatment for MRSA infections. Test performance is not FDA approved in patients less than 29 years old. Performed at [facility], [address] [place], [place] [address]   C Difficile Quick Screen w PCR reflex     Status: None   Collection Time: 08/27/21 12:08 PM   Specimen: STOOL  Result Value Ref Range Status   C Diff antigen NEGATIVE NEGATIVE Final   C Diff toxin NEGATIVE NEGATIVE Final   C Diff interpretation No C. difficile detected.  Final    Comment: Performed at [facility], [address] [place], [place] [address]     Scheduled Meds:  mouth rinse  15 mL Mouth Rinse BID   Continuous Infusions:  morphine 3 mg/hr (08/31/21 0523)    Procedures/Studies: CT ANGIO HEAD NECK W WO CM  Result Date: 08/17/2021 CLINICAL DATA:  Stroke/TIA, determine embolic source. Right frontoparietal and cerebellar infarcts as well as abnormal appearance of the right vertebral artery on MRI. EXAM: CT ANGIOGRAPHY HEAD AND NECK TECHNIQUE: Multidetector CT imaging of the head and neck was performed using the standard protocol during bolus  administration of intravenous contrast. Multiplanar CT image reconstructions and MIPs were obtained to evaluate the vascular anatomy. Carotid stenosis measurements (when applicable) are obtained utilizing NASCET criteria, using the distal internal carotid diameter as the denominator. RADIATION DOSE REDUCTION: This exam was performed according to the departmental dose-optimization program which includes automated exposure control, adjustment of the mA and/or kV according to patient size and/or use of iterative reconstruction technique. CONTRAST:  OMNIPAQUE IOHEXOL 350 MG/ML SOLN COMPARISON:  Head MRI 08/17/2021 FINDINGS: CT HEAD FINDINGS  Brain: There is a small acute infarct superiorly in the right cerebellar hemisphere as shown on MRI. The subcentimeter right frontoparietal subcortical infarct on MRI is not well seen by CT. No intracranial hemorrhage, mass, midline shift, or extra-axial fluid collection is identified. There is mild cerebral atrophy. Periventricular white matter hypodensities are nonspecific but compatible with mild chronic small vessel ischemic disease. Vascular: Calcified atherosclerosis at the skull base. Skull: No fracture or suspicious osseous lesion. Sinuses: Unchanged fluid in the right sphenoid sinus. Trace right mastoid fluid. Orbits: Bilateral cataract extraction. Review of the MIP images confirms the above findings CTA NECK FINDINGS Aortic arch: Normal variant aortic arch branching pattern with common origin of the brachiocephalic and left common carotid arteries. Widely patent arch vessel origins. Right carotid system: Patent with a small amount calcified plaque at the carotid bifurcation. No evidence of a significant stenosis or dissection. Left carotid system: Patent with minimal atherosclerotic plaque at the carotid bifurcation and possible tiny web laterally in the proximal aspect of the carotid bulb. No evidence of a significant stenosis or dissection. Vertebral arteries: The  vertebral arteries are patent without evidence of a significant stenosis or dissection of the left vertebral artery which is strongly dominant. The right vertebral artery is diffusely small in caliber with a superimposed severe stenosis at its origin. Skeleton: Mild disc and moderate facet degeneration in the cervical spine. Other neck: No evidence of cervical lymphadenopathy or mass. Upper chest: Centrilobular emphysema. Partially visualized right PICC. Review of the MIP images confirms the above findings CTA HEAD FINDINGS Anterior circulation: Internal carotid arteries are patent from skull base to carotid termini. The cavernous carotid segments are tortuous with atherosclerotic calcification greater on the left not resulting in significant stenosis. ACAs and MCAs are patent without evidence of a proximal branch occlusion or significant proximal stenosis. No aneurysm is identified. Posterior circulation: The intracranial vertebral arteries are patent to the basilar with mild nonstenotic calcified plaque on the left. There is moderate to severe stenosis of the right vertebral artery proximal to the PICA origin. Patent PICA and SCA origins are seen bilaterally. The basilar artery is widely patent and tortuous. Posterior communicating arteries are diminutive or absent. Both PCAs are patent without evidence of a significant proximal stenosis. No aneurysm is identified. Venous sinuses: As permitted by contrast timing, patent. Anatomic variants: None. Review of the MIP images confirms the above findings IMPRESSION: 1. No large vessel occlusion. 2. 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 3. No significant stenosis of the strongly dominant left vertebral artery. 4. Minimal cervical carotid artery atherosclerosis without significant stenosis. 5. Emphysema (ICD10-J43.9). Electronically Signed   By: Sebastian Ache M.D.   On: 08/17/2021 14:53   CT HEAD WO CONTRAST  Result  Date: 08/15/2021 CLINICAL DATA:  Mental status change, unknown cause EXAM: CT HEAD WITHOUT CONTRAST TECHNIQUE: Contiguous axial images were obtained from the base of the skull through the vertex without intravenous contrast. RADIATION DOSE REDUCTION: This exam was performed according to the departmental dose-optimization program which includes automated exposure control, adjustment of the mA and/or kV according to patient size and/or use of iterative reconstruction technique. COMPARISON:  08/17/2021 FINDINGS: Brain: There is atrophy and chronic small vessel disease changes. No acute intracranial abnormality. Specifically, no hemorrhage, hydrocephalus, mass lesion, acute infarction, or significant intracranial injury. Vascular: No hyperdense vessel or unexpected calcification. Skull: No acute calvarial abnormality. Sinuses/Orbits: No acute findings Other: None IMPRESSION: Atrophy, chronic microvascular disease. No acute intracranial  abnormality. Electronically Signed   By: Charlett Nose M.D.   On: 08/13/2021 18:45   CT Head Wo Contrast  Result Date: 08/04/2021 CLINICAL DATA:  Provided history: Neuro deficit, acute, stroke suspected. Mental status change, unknown cause. Slurred speech. EXAM: CT HEAD WITHOUT CONTRAST TECHNIQUE: Contiguous axial images were obtained from the base of the skull through the vertex without intravenous contrast. COMPARISON:  Brain MRI 07/13/2021.  Head CT 05/13/2014. FINDINGS: Brain: Mild generalized cerebral atrophy. Mild patchy and ill-defined hypoattenuation within the cerebral white matter, nonspecific but compatible with chronic small vessel ischemic disease. There is no acute intracranial hemorrhage. No demarcated cortical infarct. No extra-axial fluid collection. No evidence of an intracranial mass. No midline shift. Vascular: No hyperdense vessel.  Atherosclerotic calcifications. Skull: Normal. Negative for fracture or focal lesion. Sinuses/Orbits: Visualized orbits show no acute  finding. No significant paranasal sinus disease at the imaged levels. IMPRESSION: No evidence of acute intracranial abnormality. Mild chronic small vessel ischemic changes within the cerebral white matter. Mild generalized cerebral atrophy. Electronically Signed   By: Jackey Loge D.O.   On: 08/04/2021 14:14   CT Angio Chest Pulmonary Embolism (PE) W or WO Contrast  Result Date: 08/29/2021 CLINICAL DATA:  Pulmonary embolism suspected.  Positive D-dimer EXAM: CT ANGIOGRAPHY CHEST WITH CONTRAST TECHNIQUE: Multidetector CT imaging of the chest was performed using the standard protocol during bolus administration of intravenous contrast. Multiplanar CT image reconstructions and MIPs were obtained to evaluate the vascular anatomy. RADIATION DOSE REDUCTION: This exam was performed according to the departmental dose-optimization program which includes automated exposure control, adjustment of the mA and/or kV according to patient size and/or use of iterative reconstruction technique. CONTRAST:  75mL OMNIPAQUE IOHEXOL 350 MG/ML SOLN COMPARISON:  Noncontrast chest CT 09/27/2020 FINDINGS: Cardiovascular: Satisfactory opacification of the pulmonary arteries, although assessment is limited by extensive motion artifact. Pulmonary embolic clot is seen at the right lower lobar pulmonary artery extending into the superior segment right lower lobe. Branching clot is also seen in the right upper lobe. Additional subsegmental clot is likely present in both lower lobes, although limited by motion. RV to LV ratio is 0.7. No cardiomegaly or pericardial effusion. Atheromatous calcification of the aorta. Mediastinum/Nodes: No adenopathy or mass Lungs/Pleura: Diffuse reticular opacity asymmetric to the right lung where there is also pleural fluid. Some interlobular septal thickening is seen bilaterally, but not involving the entire lungs. Upper Abdomen: Severe hepatic steatosis.  Recent abdominal CT. Musculoskeletal: No acute finding  Review of the MIP images confirms the above findings. Critical Value/emergent results were called by telephone at the time of interpretation on 08/29/2021 at 8:13 am to provider Abbegayle Denault , who verbally acknowledged these results. IMPRESSION: 1. Positive for lobar to subsegmental pulmonary emboli. No morphologic changes of right heart strain. 2. Pulmonary opacification asymmetric to the right and favoring pneumonia. Aortic Atherosclerosis (ICD10-I70.0). Electronically Signed   By: Tiburcio Pea M.D.   On: 08/29/2021 08:14   MR BRAIN WO CONTRAST  Result Date: 08/17/2021 CLINICAL DATA:  Provided history: Delirium. EXAM: MRI HEAD WITHOUT CONTRAST TECHNIQUE: Multiplanar, multiecho pulse sequences of the brain and surrounding structures were obtained without intravenous contrast. COMPARISON:  Prior head CT examinations 08/13/2021 and earlier. Brain MRI 07/13/2021. FINDINGS: Brain: Intermittently motion degraded examination, limiting evaluation. Most notably, there is severe motion degradation of the sagittal T1 weighted sequence, severe motion degradation of the axial T1 weighted sequence and severe motion degradation of the coronal T2 TSE sequence. Mild to moderate generalized cerebral atrophy. Comparatively mild  cerebellar atrophy. 5 mm acute/early subacute infarct within the left frontoparietal subcortical white matter (series 5, image 25) (series 7, image 12). 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 Chronic small vessel ischemic changes which are mild in cerebral white matter, and mild to moderate in the pons. No evidence of an intracranial mass. No chronic intracranial blood products. No extra-axial fluid collection. No midline shift. Vascular: Similar to the prior brain MRI of 07/13/2021, there is signal abnormality within the intracranial right vertebral artery suggesting high-grade stenosis or vessel occlusion. Skull  and upper cervical spine: Within described limitations, no focal suspicious marrow lesion is identified. Sinuses/Orbits: Visualized orbits show no acute finding. Large fluid level, and background mild mucosal thickening, within the right sphenoid sinus. Other: Trace fluid within the bilateral mastoid air cells. IMPRESSION: Intermittently motion degraded examination, as described and limiting evaluation. 5 mm acute/early subacute infarct within the right frontoparietal subcortical white matter. 1.8 x 0.7 cm acute infarct within the superior right cerebellar hemisphere. Background chronic small vessel ischemic changes which are mild in the cerebral white matter, and mild-to-moderate in the pons, stable from the brain MRI of 07/13/2021. Similar to the prior MRI, there is signal abnormality within the intracranial right vertebral artery suggesting high-grade stenosis or vessel occlusion. MR or CT angiography may be obtained for further evaluation, as clinically warranted. Mild-to-moderate generalized cerebral atrophy. Comparatively mild cerebellar atrophy. Right sphenoid sinusitis. Electronically Signed   By: Jackey Loge D.O.   On: 08/17/2021 09:35   CT ABDOMEN PELVIS W CONTRAST  Result Date: 08/27/2021 CLINICAL DATA:  Acute abdominal pain.  Sepsis.  Cirrhosis. EXAM: CT ABDOMEN AND PELVIS WITH CONTRAST TECHNIQUE: Multidetector CT imaging of the abdomen and pelvis was performed using the standard protocol following bolus administration of intravenous contrast. RADIATION DOSE REDUCTION: This exam was performed according to the departmental dose-optimization program which includes automated exposure control, adjustment of the mA and/or kV according to patient size and/or use of iterative reconstruction technique. CONTRAST:  OMNIPAQUE IOHEXOL 300 MG/ML  SOLN COMPARISON:  07/13/2021 FINDINGS: Lower Chest: Bibasilar pulmonary airspace opacity is seen, which may be due to pulmonary edema, ARDS, or pneumonia. Small  bilateral pleural effusions are also seen. Hepatobiliary: Severe diffuse hepatic steatosis is again seen, although there are no gross morphologic changes of cirrhosis. Gallbladder is unremarkable. No evidence of biliary ductal dilatation. No hepatic masses identified. Pancreas:  No mass or inflammatory changes. Spleen: Within normal limits in size and appearance. Adrenals/Urinary Tract: No masses identified. Small peripheral areas of decreased enhancement are seen in the mid and lower poles of the right kidney which were not seen on previous study and may be due to small renal infarcts or pyelonephritis. No evidence of ureteral calculi or hydronephrosis. Unremarkable unopacified urinary bladder. Stomach/Bowel: No evidence of obstruction, inflammatory process or abnormal fluid collections. Normal appendix visualized. Vascular/Lymphatic: No pathologically enlarged lymph nodes. No acute vascular findings. Aortic atherosclerotic calcification noted. Reproductive: Several small calcified uterine fibroids are seen, largest measuring 2.0 cm. No adnexal masses are identified. Small amount of free fluid is seen in the pelvis. Other:  Mild diffuse body wall edema noted. Musculoskeletal:  No suspicious bone lesions identified. IMPRESSION: New small peripheral areas of decreased enhancement in right kidney, which may be due to small renal infarcts or pyelonephritis. Recommend correlation with urinalysis. Severe hepatic steatosis. Small calcified uterine fibroids. Small amount of pelvic ascites and diffuse body wall edema. Bibasilar pulmonary  airspace opacity, with small bilateral pleural effusions. Differential diagnosis includes pulmonary edema, ARDS, and pneumonia. Aortic Atherosclerosis (ICD10-I70.0). Electronically Signed   By: Danae Orleans M.D.   On: 08/27/2021 14:46   DG CHEST PORT 1 VIEW  Result Date: 08/29/2021 CLINICAL DATA:  Acute on chronic respiratory failure with hypoxia, altered mental status and sepsis. EXAM:  PORTABLE CHEST 1 VIEW COMPARISON:  August 27, 2021 FINDINGS: The heart size and mediastinal contours are within normal limits. No significant interval change in the bilateral diffuse interstitial markings. Similar interstitial and alveolar infiltrates in the right mid lung. No visible pleural effusion or pneumothorax. Visualized skeletal structures are unchanged. IMPRESSION: No significant interval change in the bilateral diffuse interstitial or alveolar infiltrates in the right mid lung. Electronically Signed   By: Maudry Mayhew M.D.   On: 08/29/2021 08:03   DG CHEST PORT 1 VIEW  Result Date: 08/27/2021 CLINICAL DATA:  Altered mental status, shortness of breath EXAM: PORTABLE CHEST 1 VIEW COMPARISON:  Previous studies including the examination of 08/22/2021 FINDINGS: Diffuse increased interstitial markings are seen in the left lung with possible slight improvement. There is new interstitial and alveolar infiltrate in the right upper lung fields. Costophrenic angles are clear. There is no pneumothorax. IMPRESSION: There is new large area of interstitial and alveolar infiltrate in the right upper lung fields suggesting pneumonia. There is slight improvement in aeration of left lung, possibly suggesting resolving pneumonia. There is no pleural effusion or pneumothorax. Electronically Signed   By: Ernie Avena M.D.   On: 08/27/2021 13:07   DG Chest Port 1 View  Result Date: 08/15/2021 CLINICAL DATA:  Altered mental status EXAM: PORTABLE CHEST 1 VIEW COMPARISON:  08/16/2021, CT 09/27/2020 FINDINGS: Interval development of diffuse ground-glass and interstitial opacity throughout left thorax with patchy airspace disease in the right lower lung. No pleural effusion. Normal cardiomediastinal silhouette. No pneumothorax. IMPRESSION: Interval development of patchy right lower lung airspace disease and diffuse interstitial and ground-glass opacity throughout left thorax, suspect that findings are second  bilateral infection/pneumonia. Electronically Signed   By: Jasmine Pang M.D.   On: 08/08/2021 17:25   DG CHEST PORT 1 VIEW  Result Date: 08/16/2021 CLINICAL DATA:  The reason for exam is stated as dyspnea. Hx of asthma, HTN, and current smoker of 1 pack a day x 40 years. Negative covid-19 test x 3 days ago. EXAM: PORTABLE CHEST 1 VIEW COMPARISON:  Chest x-ray 07/09/2021 acidosis chest 09/27/2000 FINDINGS: Right PICC line with tip overlying the expected region of the superior cavoatrial junction. The heart and mediastinal contours are unchanged. No focal consolidation. No pulmonary edema. No pleural effusion. No pneumothorax. No acute osseous abnormality. IMPRESSION: No active disease. Electronically Signed   By: Tish Frederickson M.D.   On: 08/16/2021 19:46   EEG adult  Result Date: 08/17/2021 Joice Lofts, MD     08/17/2021 10:12 PM [facility] Routine EEG Report Patient Name:   Tora Duck Date of Birth:   Apr 13, 1956 Identification Number:   MRN - [id number] Date of Study:   08/17/2021 17:49:17 Duration: 23 min Indication: Encephalopathy, Technical Summary: A routine 20 channel electroencephalogram using the international 10-20 system of electrode placement was performed. Background: 5-6 Hz, Poorly formed States      Drowsy: were seen during drowsiness Abnormalities Generalized Slowing: Diffuse generalized slowing Activation Procedures Hyperventilation: Not performed Photic Stimulation: Not performed Classification: Abnormal : Diagnosis: This is abnormal EEG, the Presence of Generalized slowing is consistent with Encephalopathy Dr Joice Lofts  [facility] [phone] Case [id number]  ECHOCARDIOGRAM COMPLETE  Result Date: 08/17/2021    ECHOCARDIOGRAM REPORT   Patient Name:   BIANEY AMARAL [facility] Date of Exam: 08/17/2021 Medical Rec #:  [id number]      Height:       66.0 in Accession #:    [id number]     Weight:       127.0 lb Date of Birth:   10-28-1955     BSA:          1.649 m Patient Age:    65 years       BP:           122/94 mmHg Patient Gender: F              HR:           101 bpm. [facility] Procedure: 2D Echo, Cardiac Doppler and Color Doppler Indications:    Stroke  History:        Patient has prior history of Echocardiogram examinations, most                 recent 07/06/2019. Risk Factors:Hypertension. ETOH Abuse.  Sonographer:    Mikki Harbor Referring Phys: [phone] ABRAHAM FELIZ ORTIZ IMPRESSIONS  1. Left ventricular ejection fraction, by estimation, is 65 to 70%. The left ventricle has normal function. The left ventricle has no regional wall motion abnormalities. There is mild left ventricular hypertrophy. Left ventricular diastolic parameters are consistent with Grade I diastolic dysfunction (impaired relaxation).  2. Right ventricular systolic function is normal. The right ventricular size is normal. There is mildly elevated pulmonary artery systolic pressure.  3. The mitral valve is normal in structure. No evidence of mitral valve regurgitation. No evidence of mitral stenosis.  4. The aortic valve has an indeterminant number of cusps. Aortic valve regurgitation is not visualized. No aortic stenosis is present. FINDINGS  Left Ventricle: Left ventricular ejection fraction, by estimation, is 65 to 70%. The left ventricle has normal function. The left ventricle has no regional wall motion abnormalities. The left ventricular internal cavity size was normal in size. There is  mild left ventricular hypertrophy. Left ventricular diastolic parameters are consistent with Grade I diastolic dysfunction (impaired relaxation). Normal left ventricular filling pressure. Right Ventricle: The right ventricular size is normal. No increase in right ventricular wall thickness. Right ventricular systolic function is normal. There is mildly elevated pulmonary artery systolic pressure. The tricuspid regurgitant velocity is 2.82  m/s, and with  an assumed right atrial pressure of 8 mmHg, the estimated right ventricular systolic pressure is 39.8 mmHg. Left Atrium: Left atrial size was normal in size. Right Atrium: Right atrial size was normal in size. Pericardium: There is no evidence of pericardial effusion. Mitral Valve: The mitral valve is normal in structure. No evidence of mitral valve regurgitation. No evidence of mitral valve stenosis. MV peak gradient, 8.0 mmHg. The mean mitral valve gradient is 3.0 mmHg. Tricuspid Valve: The tricuspid valve is not well visualized. Tricuspid valve regurgitation is mild . No evidence of tricuspid stenosis. Aortic Valve: The aortic valve has an indeterminant number of cusps. Aortic valve regurgitation is not visualized. No aortic stenosis is present. Aortic valve mean gradient measures 3.0 mmHg. Aortic valve peak gradient measures 5.6 mmHg. Aortic valve area, by VTI measures 2.20 cm. Pulmonic Valve: The pulmonic valve was not well visualized. Pulmonic valve regurgitation is not visualized. No evidence of pulmonic stenosis. Aorta: The aortic root is normal in size and structure.  IAS/Shunts: No atrial level shunt detected by color flow Doppler.  LEFT VENTRICLE PLAX 2D LVIDd:         3.10 cm     Diastology LVIDs:         2.00 cm     LV e' medial:    5.66 cm/s LV PW:         1.20 cm     LV E/e' medial:  11.8 LV IVS:        1.30 cm     LV e' lateral:   6.85 cm/s LVOT diam:     1.80 cm     LV E/e' lateral: 9.8 LV SV:         47 LV SV Index:   29 LVOT Area:     2.54 cm  LV Volumes (MOD) LV vol d, MOD A2C: 20.3 ml LV vol d, MOD A4C: 14.2 ml LV vol s, MOD A2C: 8.2 ml LV vol s, MOD A4C: 5.8 ml LV SV MOD A2C:     12.1 ml LV SV MOD A4C:     14.2 ml LV SV MOD BP:      10.1 ml RIGHT VENTRICLE RV Basal diam:  3.20 cm RV Mid diam:    2.80 cm RV S prime:     17.30 cm/s LEFT ATRIUM             Index        RIGHT ATRIUM           Index LA diam:        3.10 cm 1.88 cm/m   RA Area:     11.50 cm LA Vol (A2C):   30.9 ml 18.74 ml/m  RA  Volume:   26.20 ml  15.89 ml/m LA Vol (A4C):   17.0 ml 10.31 ml/m LA Biplane Vol: 25.1 ml 15.22 ml/m  AORTIC VALVE                    PULMONIC VALVE AV Area (Vmax):    2.48 cm     PV Vmax:       0.75 m/s AV Area (Vmean):   2.42 cm     PV Peak grad:  2.3 mmHg AV Area (VTI):     2.20 cm AV Vmax:           118.00 cm/s AV Vmean:          79.000 cm/s AV VTI:            0.214 m AV Peak Grad:      5.6 mmHg AV Mean Grad:      3.0 mmHg LVOT Vmax:         115.00 cm/s LVOT Vmean:        75.100 cm/s LVOT VTI:          0.185 m LVOT/AV VTI ratio: 0.86  AORTA Ao Root diam: 3.10 cm Ao Asc diam:  3.20 cm MITRAL VALVE                TRICUSPID VALVE MV Area (PHT): 5.97 cm     TR Peak grad:   31.8 mmHg MV Area VTI:   2.54 cm     TR Vmax:        282.00 cm/s MV Peak grad:  8.0 mmHg MV Mean grad:  3.0 mmHg     SHUNTS MV Vmax:       1.41 m/s     Systemic VTI:  0.18 m  MV Vmean:      75.5 cm/s    Systemic Diam: 1.80 cm MV Decel Time: 127 msec MV E velocity: 66.80 cm/s MV A velocity: 112.00 cm/s MV E/A ratio:  0.60 Dina Rich MD Electronically signed by Dina Rich MD Signature Date/Time: 08/17/2021/3:31:03 PM    Final    CT HEAD CODE STROKE WO CONTRAST  Result Date: 08/13/2021 CLINICAL DATA:  Code stroke. Initial evaluation for acute stroke, left facial droop. EXAM: CT HEAD WITHOUT CONTRAST TECHNIQUE: Contiguous axial images were obtained from the base of the skull through the vertex without intravenous contrast. RADIATION DOSE REDUCTION: This exam was performed according to the departmental dose-optimization program which includes automated exposure control, adjustment of the mA and/or kV according to patient size and/or use of iterative reconstruction technique. COMPARISON:  Prior head CT from 08/04/2021. FINDINGS: Brain: Moderately advanced cerebral atrophy with chronic small vessel ischemic disease. No acute intracranial hemorrhage. No acute large vessel territory infarct. No mass lesion, midline shift or mass effect.  No hydrocephalus or extra-axial fluid collection. Vascular: No hyperdense vessel. Calcified atherosclerosis present at the skull base. Skull: Scalp soft tissues and calvarium within normal limits. Sinuses/Orbits: Globes and orbital soft tissues demonstrate no acute finding. Visualized paranasal sinuses and mastoid air cells are clear. Other: None. ASPECTS [facility] Stroke Program Early CT Score) - Ganglionic level infarction (caudate, lentiform nuclei, internal capsule, insula, M1-M3 cortex): 7 - Supraganglionic infarction (M4-M6 cortex): 3 Total score (0-10 with 10 being normal): 10 IMPRESSION: 1. No acute intracranial infarct or other abnormality. 2. ASPECTS is 10. 3. Moderately advanced cerebral atrophy with chronic small vessel ischemic disease. Results were called by telephone at the time of interpretation on 08/13/2021 at 10:52 pm to provider [facility] , who verbally acknowledged these results. Electronically Signed   By: Rise Mu M.D.   On: 08/13/2021 22:52   [place] EKG SITE RITE  Result Date: 08/14/2021 If Site Rite image not attached, placement could not be confirmed due to current cardiac rhythm.   Catarina Hartshorn, DO  [facility]  If 7PM-7AM, please contact night-coverage [email or web address] Password [email or web address] 08/31/2021, 6:22 PM   LOS: 6 days

## 2021-08-31 NOTE — TOC Progression Note (Signed)
Transition of Care [facility]) - Progression Note    Patient Details  Name: Colleen Lee MRN: [id number] Date of Birth: [birth year]  [facility] CM/SW Contact  Shade Flood, LCSW Phone Number: 08/31/2021, 12:10 PM  Clinical Narrative:     TOC following. Pt now comfort care per MD. Damaris Schooner with pt's sister to offer referral for hospice care. Discussed hospice services and CMS provider options. Referred to [facility] at sister's request.   Will follow and assist as needed.  Expected Discharge Plan: [place] Barriers to Discharge: Continued Medical Work up  Expected Discharge Plan and Services Expected Discharge Plan: [place] In-house Referral: Clinical Social Work   Post Acute Care Choice: [place] Living arrangements for the past 2 months: [place], Single Family Home                                       Social Determinants of Health (SDOH) Interventions    Readmission Risk Interventions Readmission Risk Prevention Plan 08/14/2021 07/16/2021 05/19/2021  Transportation Screening Complete Complete Complete  HRI or Home Care Consult - - Complete  Social Work Consult for [place] Planning/Counseling - - Complete  Palliative Care Screening - - Not Applicable  Medication Review Press photographer) Complete Complete Complete  HRI or Home Care Consult Complete Complete -  SW Recovery Care/Counseling Consult Complete Complete -  Palliative Care Screening - Not Applicable -  [place] Complete Complete -  Some recent data might be hidden

## 2021-08-31 NOTE — Progress Notes (Signed)
Patient comfortable with family at bedside.

## 2021-09-01 ENCOUNTER — Other Ambulatory Visit: Payer: Self-pay | Admitting: *Deleted

## 2021-09-02 NOTE — Progress Notes (Signed)
Called to pt's room at 26 by patient's sister. Arrived to room and sister states, "She was groaning and now she's not. Please check her." Auscultated for heart tones,for one minute apically, none noted. Pt had two agonal "gasps" breaths, then none. Lack of heart tones and respirations confirmed by second nurse, Emiliano Dyer. Sister and second bedside visitor informed of confirmation of death. Comfort and sympathy offered to both. Sister left room to make phone calls, second visitor remains at bedside, denies any needs. Morphine drip stopped, O2 discontinued. MD Tat notified of pt's death, Donor services notified per protocol. Awaiting sister's return to complete post-mortem information and process.

## 2021-09-02 NOTE — Progress Notes (Signed)
Post mortem care completed, security notified of readiness for transport to morgue.

## 2021-09-02 NOTE — Death Summary Note (Signed)
in the pelvis. Other:  Mild diffuse body wall edema noted. Musculoskeletal:  No suspicious bone lesions identified. IMPRESSION: New small peripheral areas of decreased enhancement in right kidney, which may be due to small renal infarcts or pyelonephritis. Recommend correlation with urinalysis. Severe hepatic steatosis. Small calcified uterine fibroids. Small amount of pelvic ascites and diffuse body wall edema. Bibasilar pulmonary airspace opacity, with small bilateral pleural effusions. Differential diagnosis includes pulmonary edema, ARDS, and pneumonia. Aortic Atherosclerosis (ICD10-I70.0). Electronically Signed   By: John A Stahl M.D.   On: 08/27/2021 14:46  ° °DG CHEST PORT 1 VIEW ° °Result Date: 08/29/2021 °CLINICAL DATA:  Acute on chronic respiratory failure with  hypoxia, altered mental status and sepsis. EXAM: PORTABLE CHEST 1 VIEW COMPARISON:  August 27, 2021 FINDINGS: The heart size and mediastinal contours are within normal limits. No significant interval change in the bilateral diffuse interstitial markings. Similar interstitial and alveolar infiltrates in the right mid lung. No visible pleural effusion or pneumothorax. Visualized skeletal structures are unchanged. IMPRESSION: No significant interval change in the bilateral diffuse interstitial or alveolar infiltrates in the right mid lung. Electronically Signed   By: Jeffrey  Waltz M.D.   On: 08/29/2021 08:03  ° °DG CHEST PORT 1 VIEW ° °Result Date: 08/27/2021 °CLINICAL DATA:  Altered mental status, shortness of breath EXAM: PORTABLE CHEST 1 VIEW COMPARISON:  Previous studies including the examination of 08/29/2021 FINDINGS: Diffuse increased interstitial markings are seen in the left lung with possible slight improvement. There is new interstitial and alveolar infiltrate in the right upper lung fields. Costophrenic angles are clear. There is no pneumothorax. IMPRESSION: There is new large area of interstitial and alveolar infiltrate in the right upper lung fields suggesting pneumonia. There is slight improvement in aeration of left lung, possibly suggesting resolving pneumonia. There is no pleural effusion or pneumothorax. Electronically Signed   By: Palani  Rathinasamy M.D.   On: 08/27/2021 13:07  ° °DG Chest Port 1 View ° °Result Date: 08/26/2021 °CLINICAL DATA:  Altered mental status EXAM: PORTABLE CHEST 1 VIEW COMPARISON:  08/16/2021, CT 09/27/2020 FINDINGS: Interval development of diffuse ground-glass and interstitial opacity throughout left thorax with patchy airspace disease in the right lower lung. No pleural effusion. Normal cardiomediastinal silhouette. No pneumothorax. IMPRESSION: Interval development of patchy right lower lung airspace disease and diffuse interstitial and ground-glass opacity throughout  left thorax, suspect that findings are second bilateral infection/pneumonia. Electronically Signed   By: Kim  Fujinaga M.D.   On: 08/02/2021 17:25  ° °DG CHEST PORT 1 VIEW ° °Result Date: 08/16/2021 °CLINICAL DATA:  The reason for exam is stated as dyspnea. Hx of asthma, HTN, and current smoker of 1 pack a day x 40 years. Negative covid-19 test x 3 days ago. EXAM: PORTABLE CHEST 1 VIEW COMPARISON:  Chest x-ray 07/09/2021 acidosis chest 09/27/2000 FINDINGS: Right PICC line with tip overlying the expected region of the superior cavoatrial junction. The heart and mediastinal contours are unchanged. No focal consolidation. No pulmonary edema. No pleural effusion. No pneumothorax. No acute osseous abnormality. IMPRESSION: No active disease. Electronically Signed   By: Morgane  Naveau M.D.   On: 08/16/2021 19:46  ° °EEG adult ° °Result Date: 08/17/2021 °Vajapey, Geetanjali, MD     08/17/2021 10:12 PM [facility] Routine EEG Report Patient Name:   Colleen Lee Date of Birth:   06/08/1956 Identification Number:   MRN - [id number] Date of Study:   08/17/2021 17:49:17 Duration: 23 min Indication: Encephalopathy, Technical Summary: A routine 20 channel   https://www.fda.gov/media/152162/download ° °This test is no t yet approved or cleared by the [place] FDA and  °has been authorized for detection and/or diagnosis of SARS-CoV-2 by °FDA under an Emergency Use Authorization (EUA). This EUA will remain  °in effect (meaning this test can be used) for the duration of the °COVID-19 declaration under Section 564(b)(1) of the Act, 21 °U.S.C.section 360bbb-3(b)(1), unless the authorization is terminated  °or revoked sooner.  ° ° °  ° Influenza A by PCR NEGATIVE NEGATIVE Final  ° Influenza B by PCR NEGATIVE NEGATIVE Final  °  Comment: (NOTE) °The Xpert Xpress SARS-CoV-2/FLU/RSV plus assay is intended as an aid °in the diagnosis of influenza from Nasopharyngeal swab specimens and °should not be used as a sole basis for treatment. Nasal washings and °aspirates are unacceptable for Xpert Xpress SARS-CoV-2/FLU/RSV °testing. ° °Fact Sheet for Patients: °https://www.fda.gov/media/152166/download ° °Fact Sheet for Healthcare Providers: °https://www.fda.gov/media/152162/download ° °This test is not yet approved or cleared by the [place] FDA and °has been authorized for detection and/or diagnosis of SARS-CoV-2 by °FDA under an Emergency Use Authorization (EUA). This EUA will remain °in effect (meaning this test can be used) for the duration of the °COVID-19 declaration under Section 564(b)(1) of the Act, 21 U.S.C. °section 360bbb-3(b)(1), unless the authorization is terminated or °revoked. ° °Performed at [facility], [address] [place], [place] [address] °  °MRSA Next Gen by PCR, Nasal     Status: None  ° Collection Time: 08/26/21 12:00 AM  ° Specimen: Nasal Mucosa; Nasal Swab  °Result Value Ref Range Status  ° MRSA by PCR Next Gen NOT DETECTED NOT DETECTED Final  °  Comment: (NOTE) °The GeneXpert MRSA Assay (FDA approved for NASAL specimens only), °is one component of a  comprehensive MRSA colonization surveillance °program. It is not intended to diagnose MRSA infection nor to guide °or monitor treatment for MRSA infections. °Test performance is not FDA approved in patients less than 2 years °old. °Performed at [facility], [address] [place], [place] [address] °  °C Difficile Quick Screen w PCR reflex     Status: None  ° Collection Time: 08/27/21 12:08 PM  ° Specimen: STOOL  °Result Value Ref Range Status  ° C Diff antigen NEGATIVE NEGATIVE Final  ° C Diff toxin NEGATIVE NEGATIVE Final  ° C Diff interpretation No C. difficile detected.  Final  °  Comment: Performed at [facility], [address] [place], [place] [address]  ° ° °Lab °Basic Metabolic Panel: °Recent Labs  °Lab 08/15/2021 °1752 08/26/21 °0406 08/27/21 °0624 08/28/21 °0603 08/28/21 °2134 08/29/21 °0604  °NA 142 139 140 138  --  139  °K 4.1 3.4* 2.7* 2.6* 4.3 3.8  °CL 108 112* 111 110  --  113*  °CO2 21* 20* 20* 20*  --  19*  °GLUCOSE 111* 130* 132* 119*  --  102*  °BUN 17 13 17 16  --  14  °CREATININE 1.02* 0.80 0.76 0.78  --  0.89  °CALCIUM 9.8 8.7* 9.5 8.7*  --  9.5  °MG  --  1.5* 2.1 1.7  --  2.1  °PHOS  --   --   --   --   --  1.3*  ° °Liver Function Tests: °Recent Labs  °Lab 08/16/2021 °1752 08/26/21 °0406 08/27/21 °0624  °AST 54* 34 40  °ALT 25 19 19  °ALKPHOS 424* 255* 277*  °BILITOT 1.2 0.8 0.6  °PROT 6.4* 4.8* 5.3*  °ALBUMIN 1.7* 1.6* 1.8*  ° °  and/or use of iterative reconstruction technique. COMPARISON:  08/17/2021 FINDINGS: Brain: There is atrophy and chronic small vessel disease changes. No acute intracranial abnormality. Specifically, no hemorrhage, hydrocephalus, mass lesion, acute infarction, or significant intracranial injury. Vascular: No hyperdense vessel or unexpected calcification. Skull: No acute calvarial abnormality. Sinuses/Orbits: No acute findings Other: None IMPRESSION: Atrophy, chronic microvascular disease. No acute intracranial abnormality. Electronically Signed   By: Kevin  Dover M.D.   On: 08/22/2021 18:45  ° °CT Head Wo Contrast ° °Result Date: 08/04/2021 °CLINICAL DATA:  Provided history: Neuro deficit, acute, stroke suspected. Mental status change, unknown cause. Slurred speech. EXAM: CT HEAD WITHOUT CONTRAST TECHNIQUE: Contiguous axial images were obtained from the base of the skull through the vertex without intravenous contrast. COMPARISON:  Brain MRI 07/13/2021.  Head CT 05/13/2014. FINDINGS: Brain: Mild generalized cerebral atrophy. Mild patchy and ill-defined hypoattenuation within the cerebral white matter, nonspecific but compatible with chronic small vessel ischemic disease. There is no acute intracranial hemorrhage. No demarcated cortical infarct. No extra-axial fluid collection. No evidence of an intracranial mass. No midline shift. Vascular: No hyperdense vessel.  Atherosclerotic calcifications. Skull: Normal. Negative for fracture or focal lesion. Sinuses/Orbits:  Visualized orbits show no acute finding. No significant paranasal sinus disease at the imaged levels. IMPRESSION: No evidence of acute intracranial abnormality. Mild chronic small vessel ischemic changes within the cerebral white matter. Mild generalized cerebral atrophy. Electronically Signed   By: Kyle  Golden D.O.   On: 08/04/2021 14:14  ° °CT Angio Chest Pulmonary Embolism (PE) W or WO Contrast ° °Result Date: 08/29/2021 °CLINICAL DATA:  Pulmonary embolism suspected.  Positive D-dimer EXAM: CT ANGIOGRAPHY CHEST WITH CONTRAST TECHNIQUE: Multidetector CT imaging of the chest was performed using the standard protocol during bolus administration of intravenous contrast. Multiplanar CT image reconstructions and MIPs were obtained to evaluate the vascular anatomy. RADIATION DOSE REDUCTION: This exam was performed according to the departmental dose-optimization program which includes automated exposure control, adjustment of the mA and/or kV according to patient size and/or use of iterative reconstruction technique. CONTRAST:  75mL OMNIPAQUE IOHEXOL 350 MG/ML SOLN COMPARISON:  Noncontrast chest CT 09/27/2020 FINDINGS: Cardiovascular: Satisfactory opacification of the pulmonary arteries, although assessment is limited by extensive motion artifact. Pulmonary embolic clot is seen at the right lower lobar pulmonary artery extending into the superior segment right lower lobe. Branching clot is also seen in the right upper lobe. Additional subsegmental clot is likely present in both lower lobes, although limited by motion. RV to LV ratio is 0.7. No cardiomegaly or pericardial effusion. Atheromatous calcification of the aorta. Mediastinum/Nodes: No adenopathy or mass Lungs/Pleura: Diffuse reticular opacity asymmetric to the right lung where there is also pleural fluid. Some interlobular septal thickening is seen bilaterally, but not involving the entire lungs. Upper Abdomen: Severe hepatic steatosis.  Recent abdominal CT.  Musculoskeletal: No acute finding Review of the MIP images confirms the above findings. Critical Value/emergent results were called by telephone at the time of interpretation on 08/29/2021 at 8:13 am to provider   , who verbally acknowledged these results. IMPRESSION: 1. Positive for lobar to subsegmental pulmonary emboli. No morphologic changes of right heart strain. 2. Pulmonary opacification asymmetric to the right and favoring pneumonia. Aortic Atherosclerosis (ICD10-I70.0). Electronically Signed   By: Jonathan  Watts M.D.   On: 08/29/2021 08:14  ° °MR BRAIN WO CONTRAST ° °Result Date: 08/17/2021 °CLINICAL DATA:  Provided history: Delirium. EXAM: MRI HEAD WITHOUT CONTRAST TECHNIQUE: Multiplanar, multiecho pulse sequences of the brain and surrounding structures were obtained without   electroencephalogram using the international 10-20 system of electrode placement was performed. Background: 5-6 Hz, Poorly formed States      Drowsy: were seen during drowsiness Abnormalities Generalized Slowing: Diffuse generalized slowing Activation Procedures Hyperventilation: Not performed Photic Stimulation: Not performed Classification: Abnormal : Diagnosis: This is abnormal EEG, the Presence of Generalized slowing is consistent with Encephalopathy Dr Lee [facility] [phone] Case [id number] ° °ECHOCARDIOGRAM COMPLETE ° °Result Date: 08/17/2021 °   ECHOCARDIOGRAM REPORT   Patient Name:   Colleen Lee Date of Exam: 08/17/2021 Medical Rec #:  [id number]      Height:       66.0 in Accession #:     [id number]     Weight:       127.0 lb Date of Birth:  10/28/1955     BSA:          1.649 m² Patient Age:    65 years       BP:           122/94 mmHg Patient Gender: F              HR:           101 bpm. Exam Location:  [facility] Procedure: 2D Echo, Cardiac Doppler and Color Doppler Indications:    Stroke  History:        Patient has prior history of Echocardiogram examinations, most                 recent 07/06/2019. Risk Factors:Hypertension. ETOH Abuse.  Sonographer:    Dorothy Buchanan Referring Phys: [id number] ABRAHAM FELIZ ORTIZ IMPRESSIONS  1. Left ventricular ejection fraction, by estimation, is 65 to 70%. The left ventricle has normal function. The left ventricle has no regional wall motion abnormalities. There is mild left ventricular hypertrophy. Left ventricular diastolic parameters are consistent with Grade I diastolic dysfunction (impaired relaxation).  2. Right ventricular systolic function is normal. The right ventricular size is normal. There is mildly elevated pulmonary artery systolic pressure.  3. The mitral valve is normal in structure. No evidence of mitral valve regurgitation. No evidence of mitral stenosis.  4. The aortic valve has an indeterminant number of cusps. Aortic valve regurgitation is not visualized. No aortic stenosis is present. FINDINGS  Left Ventricle: Left ventricular ejection fraction, by estimation, is 65 to 70%. The left ventricle has normal function. The left ventricle has no regional wall motion abnormalities. The left ventricular internal cavity size was normal in size. There is  mild left ventricular hypertrophy. Left ventricular diastolic parameters are consistent with Grade I diastolic dysfunction (impaired relaxation). Normal left ventricular filling pressure. Right Ventricle: The right ventricular size is normal. No increase in right ventricular wall thickness. Right ventricular systolic function is normal. There is mildly elevated pulmonary artery systolic pressure. The  tricuspid regurgitant velocity is 2.82  m/s, and with an assumed right atrial pressure of 8 mmHg, the estimated right ventricular systolic pressure is 39.8 mmHg. Left Atrium: Left atrial size was normal in size. Right Atrium: Right atrial size was normal in size. Pericardium: There is no evidence of pericardial effusion. Mitral Valve: The mitral valve is normal in structure. No evidence of mitral valve regurgitation. No evidence of mitral valve stenosis. MV peak gradient, 8.0 mmHg. The mean mitral valve gradient is 3.0 mmHg. Tricuspid Valve: The tricuspid valve is not well visualized. Tricuspid valve regurgitation is mild . No evidence of tricuspid stenosis. Aortic Valve: The aortic valve has an indeterminant number of cusps. Aortic   °DEATH SUMMARY  ° °Patient Details  °Name: Colleen Lee °MRN: [id number] °DOB: 03/17/1956 ° °Admission/Discharge Information  ° °Admit Date:  08/07/2021  °Date of Death: Date of Death: 08/28/2021  °Time of Death: Time of Death: 1114  °Length of Stay: 7  °Referring Physician: Sun, Yun, MD  ° °Reason(s) for Hospitalization  °Altered mental status ° °Diagnoses  °Preliminary cause of death: severe sepsis °Secondary Diagnoses (including complications and co-morbidities):  °Severe sepsis °-Secondary to bacteremia, pneumonia and UTI/pyelo °-Presented with elevated lactate, tachycardia, leukocytosis °-Initially on vancomycin and cefepime pending culture data  °-Follow blood culture--neg to date °-Follow urine culture--multiple organisms °-Lactic acid peaked 4.6>>3.0>>2.1 °-Check PCT-5.33>>3.66>>2.76 °-now focused on comfort care measures °  °Lobar pneumonia °-Suspect a degree of aspiration °-MRSA screen--neg °-initially started on empiric cefepime for now °-now transitioned to comfort measures °  °Gram Negative Bacteremia °-08/14/2021 blood culture--GNR °-continue empiric cefepime pending culture data °-now transitioned to comfort measures °  °Acute pulmonary Embolus °-08/29/21 CTA chest--+PE, no RV strain °-initially started on IV heparin °-now transitioned to comfort measures °  °Acute respiratory failure with hypoxia °-Secondary to pneumonia and pleural effusions and PE °-COVID-19 PCR negative °-Stable on 6 L>>3L °-Wean oxygen as tolerated °-repeat CXR--personally reviewed--increased interstitial markings, RUL opacity °-lasix IV 40 mg on 1/26 and 1/27 °-1/28--personally reviewed CXR--RUL opacity, stable increased interstitial markings °-D-dimer 2.07>>>order CTA chest+PE °--now transitioned to comfort measures °  °SVT/Atrial tachycardia °-HR transiently up to 140s °-Continue lopressor 5 mg IV q 6 °-add diltiazem 30 mg po q 6 hours °-1/25 Echo--EF 65-70%, no WMA, G1DD, RVSP 39.8 °-TSH 1.183 °--now transitioned to comfort  measures °  °Pyuria °-UA 11-20 WBC °-Follow urine culture>>multiple organisms °-Continue empiric antibiotics °  °Acute metabolic encephalopathy °-Multifactorial including infectious process, vitamin and mineral deficiencies, dehydration °-UA 11-20 WBC °-TSH 1.183 °-B12--1698 °-08/18/2021 RPR negative °-TSH--1.183 °-mental status continued to decline °-now transitioned to comfort measures °  °Alcoholic liver cirrhosis °-INR 1.5 °  °Hyperammonemia °-Unclear clinical significance presently °-Recheck ammonia--23 °-Discontinue lactulose °  °Anemia of chronic disease °-08/14/2021 folic acid 5.5 °-08/14/2021 B12 1658 °-08/17/2028 thiamine 183.3 °-Transfuse for hemoglobin less than 7 °-Continue folic acid and thiamine supplementation °  °Hypomagnesemia/hypophosphatemia °-Repleted °  °Hypokalemia °-Repleted °  °Stage 4 sacral decubitus °-present on admission °-appreciate WOCN recommendation °-CT abd/pelvis--New small peripheral areas of decreased enhancement in right kidney,which may be due to small renal infarcts or pyelonephritis. Bibasilar pulmonary airspace opacity, with small bilateral pleural effusions. +anasarca °-now transitioned to comfort measures °  °Severe Malnutrition °-nutritional supplements °  °  °Goals of Care °-Brother Daniel and Sister Lily (POA) at bedside °-we discussed the patient's deteriorating condition (progressive declining mental status, continue tachycardia, tachypnea, hypoxia) and continued medical complications despite optimal medical therapy °-we discussed that patient's overall prognosis in the setting of her poor baseline function and numerous co-morbidities including but not limited to 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 °-discussed that even if patient were to improve clinically to be able to leave the hospital, she would not be the same person they knew her as in her pre-morbid state and that patient would be high risk for repeated hospitalizations.  We discussed her  poor quality of life in her current state and that would continued to worsen °Advance care planning, including the explanation and discussion of advance directives was carried out with the patient and family.  Code status including explanations of "Full Code" and "DNR" and alternatives were discussed in detail.  Discussion   °DEATH SUMMARY  ° °Patient Details  °Name: Colleen Lee °MRN: [id number] °DOB: 03/17/1956 ° °Admission/Discharge Information  ° °Admit Date:  08/07/2021  °Date of Death: Date of Death: 08/28/2021  °Time of Death: Time of Death: 1114  °Length of Stay: 7  °Referring Physician: Sun, Yun, MD  ° °Reason(s) for Hospitalization  °Altered mental status ° °Diagnoses  °Preliminary cause of death: severe sepsis °Secondary Diagnoses (including complications and co-morbidities):  °Severe sepsis °-Secondary to bacteremia, pneumonia and UTI/pyelo °-Presented with elevated lactate, tachycardia, leukocytosis °-Initially on vancomycin and cefepime pending culture data  °-Follow blood culture--neg to date °-Follow urine culture--multiple organisms °-Lactic acid peaked 4.6>>3.0>>2.1 °-Check PCT-5.33>>3.66>>2.76 °-now focused on comfort care measures °  °Lobar pneumonia °-Suspect a degree of aspiration °-MRSA screen--neg °-initially started on empiric cefepime for now °-now transitioned to comfort measures °  °Gram Negative Bacteremia °-08/14/2021 blood culture--GNR °-continue empiric cefepime pending culture data °-now transitioned to comfort measures °  °Acute pulmonary Embolus °-08/29/21 CTA chest--+PE, no RV strain °-initially started on IV heparin °-now transitioned to comfort measures °  °Acute respiratory failure with hypoxia °-Secondary to pneumonia and pleural effusions and PE °-COVID-19 PCR negative °-Stable on 6 L>>3L °-Wean oxygen as tolerated °-repeat CXR--personally reviewed--increased interstitial markings, RUL opacity °-lasix IV 40 mg on 1/26 and 1/27 °-1/28--personally reviewed CXR--RUL opacity, stable increased interstitial markings °-D-dimer 2.07>>>order CTA chest+PE °--now transitioned to comfort measures °  °SVT/Atrial tachycardia °-HR transiently up to 140s °-Continue lopressor 5 mg IV q 6 °-add diltiazem 30 mg po q 6 hours °-1/25 Echo--EF 65-70%, no WMA, G1DD, RVSP 39.8 °-TSH 1.183 °--now transitioned to comfort  measures °  °Pyuria °-UA 11-20 WBC °-Follow urine culture>>multiple organisms °-Continue empiric antibiotics °  °Acute metabolic encephalopathy °-Multifactorial including infectious process, vitamin and mineral deficiencies, dehydration °-UA 11-20 WBC °-TSH 1.183 °-B12--1698 °-08/18/2021 RPR negative °-TSH--1.183 °-mental status continued to decline °-now transitioned to comfort measures °  °Alcoholic liver cirrhosis °-INR 1.5 °  °Hyperammonemia °-Unclear clinical significance presently °-Recheck ammonia--23 °-Discontinue lactulose °  °Anemia of chronic disease °-08/14/2021 folic acid 5.5 °-08/14/2021 B12 1658 °-08/17/2028 thiamine 183.3 °-Transfuse for hemoglobin less than 7 °-Continue folic acid and thiamine supplementation °  °Hypomagnesemia/hypophosphatemia °-Repleted °  °Hypokalemia °-Repleted °  °Stage 4 sacral decubitus °-present on admission °-appreciate WOCN recommendation °-CT abd/pelvis--New small peripheral areas of decreased enhancement in right kidney,which may be due to small renal infarcts or pyelonephritis. Bibasilar pulmonary airspace opacity, with small bilateral pleural effusions. +anasarca °-now transitioned to comfort measures °  °Severe Malnutrition °-nutritional supplements °  °  °Goals of Care °-Brother Daniel and Sister Lily (POA) at bedside °-we discussed the patient's deteriorating condition (progressive declining mental status, continue tachycardia, tachypnea, hypoxia) and continued medical complications despite optimal medical therapy °-we discussed that patient's overall prognosis in the setting of her poor baseline function and numerous co-morbidities including but not limited to 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 °-discussed that even if patient were to improve clinically to be able to leave the hospital, she would not be the same person they knew her as in her pre-morbid state and that patient would be high risk for repeated hospitalizations.  We discussed her  poor quality of life in her current state and that would continued to worsen °Advance care planning, including the explanation and discussion of advance directives was carried out with the patient and family.  Code status including explanations of "Full Code" and "DNR" and alternatives were discussed in detail.  Discussion   https://www.fda.gov/media/152162/download ° °This test is no t yet approved or cleared by the [place] FDA and  °has been authorized for detection and/or diagnosis of SARS-CoV-2 by °FDA under an Emergency Use Authorization (EUA). This EUA will remain  °in effect (meaning this test can be used) for the duration of the °COVID-19 declaration under Section 564(b)(1) of the Act, 21 °U.S.C.section 360bbb-3(b)(1), unless the authorization is terminated  °or revoked sooner.  ° ° °  ° Influenza A by PCR NEGATIVE NEGATIVE Final  ° Influenza B by PCR NEGATIVE NEGATIVE Final  °  Comment: (NOTE) °The Xpert Xpress SARS-CoV-2/FLU/RSV plus assay is intended as an aid °in the diagnosis of influenza from Nasopharyngeal swab specimens and °should not be used as a sole basis for treatment. Nasal washings and °aspirates are unacceptable for Xpert Xpress SARS-CoV-2/FLU/RSV °testing. ° °Fact Sheet for Patients: °https://www.fda.gov/media/152166/download ° °Fact Sheet for Healthcare Providers: °https://www.fda.gov/media/152162/download ° °This test is not yet approved or cleared by the [place] FDA and °has been authorized for detection and/or diagnosis of SARS-CoV-2 by °FDA under an Emergency Use Authorization (EUA). This EUA will remain °in effect (meaning this test can be used) for the duration of the °COVID-19 declaration under Section 564(b)(1) of the Act, 21 U.S.C. °section 360bbb-3(b)(1), unless the authorization is terminated or °revoked. ° °Performed at [facility], [address] [place], [place] [address] °  °MRSA Next Gen by PCR, Nasal     Status: None  ° Collection Time: 08/26/21 12:00 AM  ° Specimen: Nasal Mucosa; Nasal Swab  °Result Value Ref Range Status  ° MRSA by PCR Next Gen NOT DETECTED NOT DETECTED Final  °  Comment: (NOTE) °The GeneXpert MRSA Assay (FDA approved for NASAL specimens only), °is one component of a  comprehensive MRSA colonization surveillance °program. It is not intended to diagnose MRSA infection nor to guide °or monitor treatment for MRSA infections. °Test performance is not FDA approved in patients less than 2 years °old. °Performed at [facility], [address] [place], [place] [address] °  °C Difficile Quick Screen w PCR reflex     Status: None  ° Collection Time: 08/27/21 12:08 PM  ° Specimen: STOOL  °Result Value Ref Range Status  ° C Diff antigen NEGATIVE NEGATIVE Final  ° C Diff toxin NEGATIVE NEGATIVE Final  ° C Diff interpretation No C. difficile detected.  Final  °  Comment: Performed at [facility], [address] [place], [place] [address]  ° ° °Lab °Basic Metabolic Panel: °Recent Labs  °Lab 08/15/2021 °1752 08/26/21 °0406 08/27/21 °0624 08/28/21 °0603 08/28/21 °2134 08/29/21 °0604  °NA 142 139 140 138  --  139  °K 4.1 3.4* 2.7* 2.6* 4.3 3.8  °CL 108 112* 111 110  --  113*  °CO2 21* 20* 20* 20*  --  19*  °GLUCOSE 111* 130* 132* 119*  --  102*  °BUN 17 13 17 16  --  14  °CREATININE 1.02* 0.80 0.76 0.78  --  0.89  °CALCIUM 9.8 8.7* 9.5 8.7*  --  9.5  °MG  --  1.5* 2.1 1.7  --  2.1  °PHOS  --   --   --   --   --  1.3*  ° °Liver Function Tests: °Recent Labs  °Lab 08/16/2021 °1752 08/26/21 °0406 08/27/21 °0624  °AST 54* 34 40  °ALT 25 19 19  °ALKPHOS 424* 255* 277*  °BILITOT 1.2 0.8 0.6  °PROT 6.4* 4.8* 5.3*  °ALBUMIN 1.7* 1.6* 1.8*  ° °  https://www.fda.gov/media/152162/download ° °This test is no t yet approved or cleared by the [place] FDA and  °has been authorized for detection and/or diagnosis of SARS-CoV-2 by °FDA under an Emergency Use Authorization (EUA). This EUA will remain  °in effect (meaning this test can be used) for the duration of the °COVID-19 declaration under Section 564(b)(1) of the Act, 21 °U.S.C.section 360bbb-3(b)(1), unless the authorization is terminated  °or revoked sooner.  ° ° °  ° Influenza A by PCR NEGATIVE NEGATIVE Final  ° Influenza B by PCR NEGATIVE NEGATIVE Final  °  Comment: (NOTE) °The Xpert Xpress SARS-CoV-2/FLU/RSV plus assay is intended as an aid °in the diagnosis of influenza from Nasopharyngeal swab specimens and °should not be used as a sole basis for treatment. Nasal washings and °aspirates are unacceptable for Xpert Xpress SARS-CoV-2/FLU/RSV °testing. ° °Fact Sheet for Patients: °https://www.fda.gov/media/152166/download ° °Fact Sheet for Healthcare Providers: °https://www.fda.gov/media/152162/download ° °This test is not yet approved or cleared by the [place] FDA and °has been authorized for detection and/or diagnosis of SARS-CoV-2 by °FDA under an Emergency Use Authorization (EUA). This EUA will remain °in effect (meaning this test can be used) for the duration of the °COVID-19 declaration under Section 564(b)(1) of the Act, 21 U.S.C. °section 360bbb-3(b)(1), unless the authorization is terminated or °revoked. ° °Performed at [facility], [address] [place], [place] [address] °  °MRSA Next Gen by PCR, Nasal     Status: None  ° Collection Time: 08/26/21 12:00 AM  ° Specimen: Nasal Mucosa; Nasal Swab  °Result Value Ref Range Status  ° MRSA by PCR Next Gen NOT DETECTED NOT DETECTED Final  °  Comment: (NOTE) °The GeneXpert MRSA Assay (FDA approved for NASAL specimens only), °is one component of a  comprehensive MRSA colonization surveillance °program. It is not intended to diagnose MRSA infection nor to guide °or monitor treatment for MRSA infections. °Test performance is not FDA approved in patients less than 2 years °old. °Performed at [facility], [address] [place], [place] [address] °  °C Difficile Quick Screen w PCR reflex     Status: None  ° Collection Time: 08/27/21 12:08 PM  ° Specimen: STOOL  °Result Value Ref Range Status  ° C Diff antigen NEGATIVE NEGATIVE Final  ° C Diff toxin NEGATIVE NEGATIVE Final  ° C Diff interpretation No C. difficile detected.  Final  °  Comment: Performed at [facility], [address] [place], [place] [address]  ° ° °Lab °Basic Metabolic Panel: °Recent Labs  °Lab 08/15/2021 °1752 08/26/21 °0406 08/27/21 °0624 08/28/21 °0603 08/28/21 °2134 08/29/21 °0604  °NA 142 139 140 138  --  139  °K 4.1 3.4* 2.7* 2.6* 4.3 3.8  °CL 108 112* 111 110  --  113*  °CO2 21* 20* 20* 20*  --  19*  °GLUCOSE 111* 130* 132* 119*  --  102*  °BUN 17 13 17 16  --  14  °CREATININE 1.02* 0.80 0.76 0.78  --  0.89  °CALCIUM 9.8 8.7* 9.5 8.7*  --  9.5  °MG  --  1.5* 2.1 1.7  --  2.1  °PHOS  --   --   --   --   --  1.3*  ° °Liver Function Tests: °Recent Labs  °Lab 08/16/2021 °1752 08/26/21 °0406 08/27/21 °0624  °AST 54* 34 40  °ALT 25 19 19  °ALKPHOS 424* 255* 277*  °BILITOT 1.2 0.8 0.6  °PROT 6.4* 4.8* 5.3*  °ALBUMIN 1.7* 1.6* 1.8*  ° °  https://www.fda.gov/media/152162/download ° °This test is no t yet approved or cleared by the [place] FDA and  °has been authorized for detection and/or diagnosis of SARS-CoV-2 by °FDA under an Emergency Use Authorization (EUA). This EUA will remain  °in effect (meaning this test can be used) for the duration of the °COVID-19 declaration under Section 564(b)(1) of the Act, 21 °U.S.C.section 360bbb-3(b)(1), unless the authorization is terminated  °or revoked sooner.  ° ° °  ° Influenza A by PCR NEGATIVE NEGATIVE Final  ° Influenza B by PCR NEGATIVE NEGATIVE Final  °  Comment: (NOTE) °The Xpert Xpress SARS-CoV-2/FLU/RSV plus assay is intended as an aid °in the diagnosis of influenza from Nasopharyngeal swab specimens and °should not be used as a sole basis for treatment. Nasal washings and °aspirates are unacceptable for Xpert Xpress SARS-CoV-2/FLU/RSV °testing. ° °Fact Sheet for Patients: °https://www.fda.gov/media/152166/download ° °Fact Sheet for Healthcare Providers: °https://www.fda.gov/media/152162/download ° °This test is not yet approved or cleared by the [place] FDA and °has been authorized for detection and/or diagnosis of SARS-CoV-2 by °FDA under an Emergency Use Authorization (EUA). This EUA will remain °in effect (meaning this test can be used) for the duration of the °COVID-19 declaration under Section 564(b)(1) of the Act, 21 U.S.C. °section 360bbb-3(b)(1), unless the authorization is terminated or °revoked. ° °Performed at [facility], [address] [place], [place] [address] °  °MRSA Next Gen by PCR, Nasal     Status: None  ° Collection Time: 08/26/21 12:00 AM  ° Specimen: Nasal Mucosa; Nasal Swab  °Result Value Ref Range Status  ° MRSA by PCR Next Gen NOT DETECTED NOT DETECTED Final  °  Comment: (NOTE) °The GeneXpert MRSA Assay (FDA approved for NASAL specimens only), °is one component of a  comprehensive MRSA colonization surveillance °program. It is not intended to diagnose MRSA infection nor to guide °or monitor treatment for MRSA infections. °Test performance is not FDA approved in patients less than 2 years °old. °Performed at [facility], [address] [place], [place] [address] °  °C Difficile Quick Screen w PCR reflex     Status: None  ° Collection Time: 08/27/21 12:08 PM  ° Specimen: STOOL  °Result Value Ref Range Status  ° C Diff antigen NEGATIVE NEGATIVE Final  ° C Diff toxin NEGATIVE NEGATIVE Final  ° C Diff interpretation No C. difficile detected.  Final  °  Comment: Performed at [facility], [address] [place], [place] [address]  ° ° °Lab °Basic Metabolic Panel: °Recent Labs  °Lab 08/15/2021 °1752 08/26/21 °0406 08/27/21 °0624 08/28/21 °0603 08/28/21 °2134 08/29/21 °0604  °NA 142 139 140 138  --  139  °K 4.1 3.4* 2.7* 2.6* 4.3 3.8  °CL 108 112* 111 110  --  113*  °CO2 21* 20* 20* 20*  --  19*  °GLUCOSE 111* 130* 132* 119*  --  102*  °BUN 17 13 17 16  --  14  °CREATININE 1.02* 0.80 0.76 0.78  --  0.89  °CALCIUM 9.8 8.7* 9.5 8.7*  --  9.5  °MG  --  1.5* 2.1 1.7  --  2.1  °PHOS  --   --   --   --   --  1.3*  ° °Liver Function Tests: °Recent Labs  °Lab 08/16/2021 °1752 08/26/21 °0406 08/27/21 °0624  °AST 54* 34 40  °ALT 25 19 19  °ALKPHOS 424* 255* 277*  °BILITOT 1.2 0.8 0.6  °PROT 6.4* 4.8* 5.3*  °ALBUMIN 1.7* 1.6* 1.8*  ° °  electroencephalogram using the international 10-20 system of electrode placement was performed. Background: 5-6 Hz, Poorly formed States      Drowsy: were seen during drowsiness Abnormalities Generalized Slowing: Diffuse generalized slowing Activation Procedures Hyperventilation: Not performed Photic Stimulation: Not performed Classification: Abnormal : Diagnosis: This is abnormal EEG, the Presence of Generalized slowing is consistent with Encephalopathy Dr [facility] [employer] [phone] Case [id number] ° °ECHOCARDIOGRAM COMPLETE ° °Result Date: 08/17/2021 °   ECHOCARDIOGRAM REPORT   Patient Name:   Colleen Lee Date of Exam: 08/17/2021 Medical Rec #:  [id number]      Height:       66.0 in Accession #:     [id number]     Weight:       127.0 lb Date of Birth:  10/28/1955     BSA:          1.649 m² Patient Age:    65 years       BP:           122/94 mmHg Patient Gender: F              HR:           101 bpm. Exam Location:  [facility] Procedure: 2D Echo, Cardiac Doppler and Color Doppler Indications:    Stroke  History:        Patient has prior history of Echocardiogram examinations, most                 recent 07/06/2019. Risk Factors:Hypertension. ETOH Abuse.  Sonographer:    Dorothy Buchanan Referring Phys: [id number] ABRAHAM FELIZ ORTIZ IMPRESSIONS  1. Left ventricular ejection fraction, by estimation, is 65 to 70%. The left ventricle has normal function. The left ventricle has no regional wall motion abnormalities. There is mild left ventricular hypertrophy. Left ventricular diastolic parameters are consistent with Grade I diastolic dysfunction (impaired relaxation).  2. Right ventricular systolic function is normal. The right ventricular size is normal. There is mildly elevated pulmonary artery systolic pressure.  3. The mitral valve is normal in structure. No evidence of mitral valve regurgitation. No evidence of mitral stenosis.  4. The aortic valve has an indeterminant number of cusps. Aortic valve regurgitation is not visualized. No aortic stenosis is present. FINDINGS  Left Ventricle: Left ventricular ejection fraction, by estimation, is 65 to 70%. The left ventricle has normal function. The left ventricle has no regional wall motion abnormalities. The left ventricular internal cavity size was normal in size. There is  mild left ventricular hypertrophy. Left ventricular diastolic parameters are consistent with Grade I diastolic dysfunction (impaired relaxation). Normal left ventricular filling pressure. Right Ventricle: The right ventricular size is normal. No increase in right ventricular wall thickness. Right ventricular systolic function is normal. There is mildly elevated pulmonary artery systolic pressure. The  tricuspid regurgitant velocity is 2.82  m/s, and with an assumed right atrial pressure of 8 mmHg, the estimated right ventricular systolic pressure is 39.8 mmHg. Left Atrium: Left atrial size was normal in size. Right Atrium: Right atrial size was normal in size. Pericardium: There is no evidence of pericardial effusion. Mitral Valve: The mitral valve is normal in structure. No evidence of mitral valve regurgitation. No evidence of mitral valve stenosis. MV peak gradient, 8.0 mmHg. The mean mitral valve gradient is 3.0 mmHg. Tricuspid Valve: The tricuspid valve is not well visualized. Tricuspid valve regurgitation is mild . No evidence of tricuspid stenosis. Aortic Valve: The aortic valve has an indeterminant number of cusps. Aortic   °DEATH SUMMARY  ° °Patient Details  °Name: Colleen Lee °MRN: [id number] °DOB: 03/17/1956 ° °Admission/Discharge Information  ° °Admit Date:  08/07/2021  °Date of Death: Date of Death: 08/28/2021  °Time of Death: Time of Death: 1114  °Length of Stay: 7  °Referring Physician: Sun, Yun, MD  ° °Reason(s) for Hospitalization  °Altered mental status ° °Diagnoses  °Preliminary cause of death: severe sepsis °Secondary Diagnoses (including complications and co-morbidities):  °Severe sepsis °-Secondary to bacteremia, pneumonia and UTI/pyelo °-Presented with elevated lactate, tachycardia, leukocytosis °-Initially on vancomycin and cefepime pending culture data  °-Follow blood culture--neg to date °-Follow urine culture--multiple organisms °-Lactic acid peaked 4.6>>3.0>>2.1 °-Check PCT-5.33>>3.66>>2.76 °-now focused on comfort care measures °  °Lobar pneumonia °-Suspect a degree of aspiration °-MRSA screen--neg °-initially started on empiric cefepime for now °-now transitioned to comfort measures °  °Gram Negative Bacteremia °-08/14/2021 blood culture--GNR °-continue empiric cefepime pending culture data °-now transitioned to comfort measures °  °Acute pulmonary Embolus °-08/29/21 CTA chest--+PE, no RV strain °-initially started on IV heparin °-now transitioned to comfort measures °  °Acute respiratory failure with hypoxia °-Secondary to pneumonia and pleural effusions and PE °-COVID-19 PCR negative °-Stable on 6 L>>3L °-Wean oxygen as tolerated °-repeat CXR--personally reviewed--increased interstitial markings, RUL opacity °-lasix IV 40 mg on 1/26 and 1/27 °-1/28--personally reviewed CXR--RUL opacity, stable increased interstitial markings °-D-dimer 2.07>>>order CTA chest+PE °--now transitioned to comfort measures °  °SVT/Atrial tachycardia °-HR transiently up to 140s °-Continue lopressor 5 mg IV q 6 °-add diltiazem 30 mg po q 6 hours °-1/25 Echo--EF 65-70%, no WMA, G1DD, RVSP 39.8 °-TSH 1.183 °--now transitioned to comfort  measures °  °Pyuria °-UA 11-20 WBC °-Follow urine culture>>multiple organisms °-Continue empiric antibiotics °  °Acute metabolic encephalopathy °-Multifactorial including infectious process, vitamin and mineral deficiencies, dehydration °-UA 11-20 WBC °-TSH 1.183 °-B12--1698 °-08/18/2021 RPR negative °-TSH--1.183 °-mental status continued to decline °-now transitioned to comfort measures °  °Alcoholic liver cirrhosis °-INR 1.5 °  °Hyperammonemia °-Unclear clinical significance presently °-Recheck ammonia--23 °-Discontinue lactulose °  °Anemia of chronic disease °-08/14/2021 folic acid 5.5 °-08/14/2021 B12 1658 °-08/17/2028 thiamine 183.3 °-Transfuse for hemoglobin less than 7 °-Continue folic acid and thiamine supplementation °  °Hypomagnesemia/hypophosphatemia °-Repleted °  °Hypokalemia °-Repleted °  °Stage 4 sacral decubitus °-present on admission °-appreciate WOCN recommendation °-CT abd/pelvis--New small peripheral areas of decreased enhancement in right kidney,which may be due to small renal infarcts or pyelonephritis. Bibasilar pulmonary airspace opacity, with small bilateral pleural effusions. +anasarca °-now transitioned to comfort measures °  °Severe Malnutrition °-nutritional supplements °  °  °Goals of Care °-Brother Daniel and Sister Lily (POA) at bedside °-we discussed the patient's deteriorating condition (progressive declining mental status, continue tachycardia, tachypnea, hypoxia) and continued medical complications despite optimal medical therapy °-we discussed that patient's overall prognosis in the setting of her poor baseline function and numerous co-morbidities including but not limited to 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 °-discussed that even if patient were to improve clinically to be able to leave the hospital, she would not be the same person they knew her as in her pre-morbid state and that patient would be high risk for repeated hospitalizations.  We discussed her  poor quality of life in her current state and that would continued to worsen °Advance care planning, including the explanation and discussion of advance directives was carried out with the patient and family.  Code status including explanations of "Full Code" and "DNR" and alternatives were discussed in detail.  Discussion   and/or use of iterative reconstruction technique. COMPARISON:  08/17/2021 FINDINGS: Brain: There is atrophy and chronic small vessel disease changes. No acute intracranial abnormality. Specifically, no hemorrhage, hydrocephalus, mass lesion, acute infarction, or significant intracranial injury. Vascular: No hyperdense vessel or unexpected calcification. Skull: No acute calvarial abnormality. Sinuses/Orbits: No acute findings Other: None IMPRESSION: Atrophy, chronic microvascular disease. No acute intracranial abnormality. Electronically Signed   By: Kevin  Dover M.D.   On: 08/22/2021 18:45  ° °CT Head Wo Contrast ° °Result Date: 08/04/2021 °CLINICAL DATA:  Provided history: Neuro deficit, acute, stroke suspected. Mental status change, unknown cause. Slurred speech. EXAM: CT HEAD WITHOUT CONTRAST TECHNIQUE: Contiguous axial images were obtained from the base of the skull through the vertex without intravenous contrast. COMPARISON:  Brain MRI 07/13/2021.  Head CT 05/13/2014. FINDINGS: Brain: Mild generalized cerebral atrophy. Mild patchy and ill-defined hypoattenuation within the cerebral white matter, nonspecific but compatible with chronic small vessel ischemic disease. There is no acute intracranial hemorrhage. No demarcated cortical infarct. No extra-axial fluid collection. No evidence of an intracranial mass. No midline shift. Vascular: No hyperdense vessel.  Atherosclerotic calcifications. Skull: Normal. Negative for fracture or focal lesion. Sinuses/Orbits:  Visualized orbits show no acute finding. No significant paranasal sinus disease at the imaged levels. IMPRESSION: No evidence of acute intracranial abnormality. Mild chronic small vessel ischemic changes within the cerebral white matter. Mild generalized cerebral atrophy. Electronically Signed   By: Kyle  Golden D.O.   On: 08/04/2021 14:14  ° °CT Angio Chest Pulmonary Embolism (PE) W or WO Contrast ° °Result Date: 08/29/2021 °CLINICAL DATA:  Pulmonary embolism suspected.  Positive D-dimer EXAM: CT ANGIOGRAPHY CHEST WITH CONTRAST TECHNIQUE: Multidetector CT imaging of the chest was performed using the standard protocol during bolus administration of intravenous contrast. Multiplanar CT image reconstructions and MIPs were obtained to evaluate the vascular anatomy. RADIATION DOSE REDUCTION: This exam was performed according to the departmental dose-optimization program which includes automated exposure control, adjustment of the mA and/or kV according to patient size and/or use of iterative reconstruction technique. CONTRAST:  75mL OMNIPAQUE IOHEXOL 350 MG/ML SOLN COMPARISON:  Noncontrast chest CT 09/27/2020 FINDINGS: Cardiovascular: Satisfactory opacification of the pulmonary arteries, although assessment is limited by extensive motion artifact. Pulmonary embolic clot is seen at the right lower lobar pulmonary artery extending into the superior segment right lower lobe. Branching clot is also seen in the right upper lobe. Additional subsegmental clot is likely present in both lower lobes, although limited by motion. RV to LV ratio is 0.7. No cardiomegaly or pericardial effusion. Atheromatous calcification of the aorta. Mediastinum/Nodes: No adenopathy or mass Lungs/Pleura: Diffuse reticular opacity asymmetric to the right lung where there is also pleural fluid. Some interlobular septal thickening is seen bilaterally, but not involving the entire lungs. Upper Abdomen: Severe hepatic steatosis.  Recent abdominal CT.  Musculoskeletal: No acute finding Review of the MIP images confirms the above findings. Critical Value/emergent results were called by telephone at the time of interpretation on 08/29/2021 at 8:13 am to provider   , who verbally acknowledged these results. IMPRESSION: 1. Positive for lobar to subsegmental pulmonary emboli. No morphologic changes of right heart strain. 2. Pulmonary opacification asymmetric to the right and favoring pneumonia. Aortic Atherosclerosis (ICD10-I70.0). Electronically Signed   By: Jonathan  Watts M.D.   On: 08/29/2021 08:14  ° °MR BRAIN WO CONTRAST ° °Result Date: 08/17/2021 °CLINICAL DATA:  Provided history: Delirium. EXAM: MRI HEAD WITHOUT CONTRAST TECHNIQUE: Multiplanar, multiecho pulse sequences of the brain and surrounding structures were obtained without

## 2021-09-02 NOTE — Progress Notes (Signed)
Nutrition Brief Note  Chart reviewed. Pt now transitioning to comfort care.  No further nutrition interventions planned at this time.  Please re-consult as needed.   Lynn Etai Copado MS,RD,CSG,LDN Contact: AMION 

## 2021-09-02 NOTE — Telephone Encounter (Signed)
A user error has taken place: encounter opened in error, closed for administrative reasons.

## 2021-09-02 NOTE — Patient Outreach (Signed)
[facility]) Care Management  21-Sep-2021  Colleen Lee 07/26/1956 [id number]   Member remains in hospital.  Per notes, she is now a DNR and has transitioned to comfort care.  Will close case at this time.  Valente David, RN, MSN, [place] Manager [phone]

## 2021-09-02 NOTE — Progress Notes (Signed)
Pt with respirations of 6-8/min, HR 98/min apically. Resp gurgling sounding, offered to admin ordered robinul for this and sister agreeable. Skin hot and dry to touch, pt unresponsive to all stimuli.  IV morphine infusing per order, IV site without s/s infiltration.  Three family members present at bedside at this time. Offered explanation of care provided, and support to family members. No needs expressed at this time.

## 2021-09-02 NOTE — Progress Notes (Signed)
Pt's family remains in room with body. Pt's sister states they will be leaving soon. Chaplain aware of pt death and in to speak with family.

## 2021-09-02 DEATH — deceased

## 2021-09-03 ENCOUNTER — Ambulatory Visit: Payer: Medicare Other | Admitting: Gastroenterology

## 2021-09-22 ENCOUNTER — Inpatient Hospital Stay: Payer: Commercial Managed Care - HMO | Admitting: Diagnostic Neuroimaging

## 2021-12-15 ENCOUNTER — Ambulatory Visit: Payer: Medicare Other | Admitting: Gastroenterology
